# Patient Record
Sex: Male | Born: 1960 | Race: Black or African American | Hispanic: No | Marital: Single | Smoking: Former smoker
Health system: Southern US, Community
[De-identification: ages and names within clinical notes are randomized; demographics above are authoritative.]

## PROBLEM LIST (undated history)

## (undated) DIAGNOSIS — I469 Cardiac arrest, cause unspecified: Secondary | ICD-10-CM

## (undated) DIAGNOSIS — I5042 Chronic combined systolic (congestive) and diastolic (congestive) heart failure: Secondary | ICD-10-CM

## (undated) DIAGNOSIS — E785 Hyperlipidemia, unspecified: Secondary | ICD-10-CM

## (undated) DIAGNOSIS — G4730 Sleep apnea, unspecified: Secondary | ICD-10-CM

## (undated) DIAGNOSIS — N529 Male erectile dysfunction, unspecified: Secondary | ICD-10-CM

## (undated) DIAGNOSIS — Z950 Presence of cardiac pacemaker: Secondary | ICD-10-CM

## (undated) DIAGNOSIS — I10 Essential (primary) hypertension: Secondary | ICD-10-CM

## (undated) DIAGNOSIS — I255 Ischemic cardiomyopathy: Secondary | ICD-10-CM

## (undated) DIAGNOSIS — I2510 Atherosclerotic heart disease of native coronary artery without angina pectoris: Principal | ICD-10-CM

## (undated) HISTORY — PX: CARDIAC DEFIBRILLATOR PLACEMENT: SHX171

## (undated) HISTORY — DX: Ischemic cardiomyopathy: I25.5

## (undated) HISTORY — PX: WRIST TENODESIS: SHX1094

## (undated) HISTORY — DX: Chronic combined systolic (congestive) and diastolic (congestive) heart failure: I50.42

## (undated) HISTORY — PX: ACHILLES TENDON REPAIR: SUR1153

## (undated) HISTORY — DX: Atherosclerotic heart disease of native coronary artery without angina pectoris: I25.10

## (undated) HISTORY — DX: Male erectile dysfunction, unspecified: N52.9

## (undated) HISTORY — DX: Essential (primary) hypertension: I10

## (undated) HISTORY — DX: Hyperlipidemia, unspecified: E78.5

## (undated) NOTE — *Deleted (*Deleted)
Physical Medicine and Rehabilitation Admission H&P    Chief Complaint  Patient presents with  . Tachycardia  . Hypotension  : HPI: Gregory [name] is a 27 year old right-handed male with history of hyperlipidemia, seizure disorder maintained on Keppra, CAD, diastolic congestive heart failure maintained on Entresto, ischemic cardiomyopathy, VT status post AICD maintained on Plavix, quit smoking 7 years ago.  Per chart review lives with his son who works from home.  Multilevel home 10 steps to entry.  Reportedly independent prior to admission and active.  Presented 09/09/2020 with generalized weakness and dizziness in the setting of multiple days of diarrhea.  Denied any nausea vomiting or abdominal pain.  Ultrasound the abdomen showed cholelithiasis without evidence of acute cholecystitis and fatty liver.  Chest x-ray negative.  Admission chemistry sodium 130, potassium 3.1, chloride 95, glucose 189, BUN 55, creatinine 5.60, calcium 5.2, AST 88, lactic acid 3.2, troponin 48, magnesium 1.6, urine culture no growth.  Echocardiogram with ejection fraction of 55 to 60% no wall motion abnormalities.  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  Cardiac catheterization completed 09/24/2020 per Dr. Gery Pray for follow-up showed no obstructive disease.  Patient did experience some altered mental status cranial CT scan completed which was reviewed by neurology services no acute process noted.  He was placed empirically on IV Rocephin for question UTI latest cultures negative.  Follow-up cardiology services for acute on chronic diastolic congestive heart failure and patient has undergone diuresis.  Patient with persistent  elevations in glucose 186-237 and hemoglobin A1c 6.9 currently maintained on sliding scale insulin as well as scheduled Levemir 5 units twice daily.  Patient remains on Keppra for history of seizure disorder.  AKI likely prerenal in the setting of A. fib with RVR placed on gentle IV fluids and latest creatinine 0.86.  Tolerating a regular diet.  Therapy evaluations completed and patient was admitted for a comprehensive rehab program.  Review of Systems  Constitutional: Positive for malaise/fatigue. Negative for chills and fever.  HENT: Negative for hearing loss.   Eyes: Negative for blurred vision and double vision.  Respiratory: Positive for shortness of breath. Negative for cough.   Cardiovascular: Positive for palpitations and leg swelling. Negative for chest pain.  Gastrointestinal: Positive for constipation. Negative for heartburn, nausea and vomiting.  Genitourinary: Positive for urgency. Negative for dysuria, flank pain and hematuria.  Musculoskeletal: Positive for myalgias.  Skin: Negative for rash.  Neurological: Positive for seizures.  All other systems reviewed and are negative.  Past Medical History:  Diagnosis Date  . CAD (coronary artery disease) 12/01/2013  . Chronic combined systolic and diastolic CHF, NYHA class 1 (HCC) 12/01/2013  . Erectile dysfunction 12/01/2013  . HTN (hypertension) 12/01/2013  . Hyperlipidemia 12/01/2013  . Ischemic cardiomyopathy 12/01/2013  . Sleep apnea    Past Surgical History:  Procedure Laterality Date  . ACHILLES TENDON REPAIR     lft foot  . CARDIAC CATHETERIZATION N/A 05/05/2016   Procedure: Left Heart Cath and Coronary Angiography;  Surgeon: Marykay Lex, MD;  Location: [facility] INVASIVE CV LAB;  Service: Cardiovascular;  Laterality: N/A;  . CARDIAC DEFIBRILLATOR PLACEMENT    . CARDIOVERSION N/A 09/13/2020   Procedure: CARDIOVERSION;  Surgeon: Christell Constant, MD;  Location: [facility] ENDOSCOPY;  Service: Cardiovascular;  Laterality: N/A;   . EP IMPLANTABLE DEVICE N/A 12/19/2016   Procedure: ICD Generator Changeout;  Surgeon: Duke Salvia, MD;  Location: [facility] INVASIVE CV LAB;  Service: Cardiovascular;  Laterality: N/A;  . WRIST TENODESIS     Family History  Problem Relation Age of Onset  . Heart disease Mother   . Brain cancer Father   . Hypertension Daughter    Social History:  reports that he quit smoking about 7 years ago. His smoking use included cigarettes. He smoked 0.00 packs per day for 30.00 years. He has never used smokeless tobacco. He reports that he does not drink alcohol and does not use drugs. Allergies: No Known Allergies Medications Prior to Admission  Medication Sig Dispense Refill  . aspirin 81 MG tablet Take 81 mg by mouth daily.    Marland Kitchen atorvastatin (LIPITOR) 80 MG tablet TAKE ONE TABLET BY MOUTH EVERY EVENING (Patient taking differently: Take 80 mg by mouth daily. ) 90 tablet 3  . clopidogrel (PLAVIX) 75 MG tablet TAKE 1 TABLET(75 MG) BY MOUTH DAILY (Patient taking differently: Take 75 mg by mouth daily. ) 90 tablet 2  . COREG CR 80 MG 24 hr capsule Take 1 capsule (80 mg total) by mouth daily. 90 capsule 3  . ENTRESTO 97-103 MG TAKE 1 TABLET BY MOUTH TWICE DAILY (Patient taking differently: Take 1 tablet by mouth 2 (two) times daily. ) 60 tablet 10  . FEROSUL 325 (65 Fe) MG tablet TAKE 1 TABLET BY MOUTH DAILY WITH BREAKFAST (Patient taking differently: Take 325 mg by mouth daily with breakfast. ) 60 tablet 1  . hydrochlorothiazide (HYDRODIURIL) 25 MG tablet TAKE 1 TABLET(25 MG) BY MOUTH DAILY (Patient taking differently: Take 25 mg by mouth daily. ) 90 tablet 1  . levETIRAcetam (KEPPRA) 500 MG tablet TAKE 1 TABLET(500 MG) BY MOUTH TWICE DAILY (Patient taking differently: Take 500 mg by mouth 2 (two) times daily. ) 60 tablet 11  . metoprolol succinate (TOPROL XL) 25 MG 24 hr tablet Take 1 tablet (25 mg total) by mouth daily. 90 tablet 3  . pantoprazole (PROTONIX) 40 MG tablet TAKE 1 TABLET(40 MG) BY MOUTH DAILY  (Patient taking differently: Take 40 mg by mouth daily. ) 30 tablet 6  . polyethylene glycol (MIRALAX / GLYCOLAX) packet Take 17 g by mouth daily as needed for moderate constipation or severe constipation. 14 each 1  . potassium chloride (KLOR-CON) 10 MEQ tablet TAKE 1 TABLET BY MOUTH EVERY DAY (Patient taking differently: Take 10 mEq by mouth daily. ) 90 tablet 3  . sildenafil (VIAGRA) 50 MG tablet Take 1 tablet (50 mg total) by mouth as needed for erectile dysfunction. 30 tablet 4    Drug Regimen Review Drug regimen was reviewed and remains appropriate with no significant issues identified  Home: Home Living Family/patient expects to be discharged to:: Private residence Living Arrangements: Children Available Help at Discharge: Family, Available 24 hours/day Type of Home: House Home Access: Level entry Home Layout: Multi-level Alternate Level Stairs-Number of Steps: 10 Alternate Level Stairs-Rails: Right Bathroom Shower/Tub: Engineer, manufacturing systems: Standard Home Equipment: None   Functional History: Prior Function Level of Independence: Independent Comments: pt reports independent and driving, no assist with anything.  Functional Status:  Mobility: Bed Mobility Overal bed mobility: Needs Assistance Bed Mobility: Rolling, Supine to Sit Rolling: Min assist Supine to sit: Mod assist, HOB elevated Transfers Overall transfer level: Needs assistance Equipment used: Rolling walker (2 wheeled) Transfers: Sit to/from Stand, [employer]  to Stand: Mod assist, From elevated surface Stand pivot transfers: Mod assist, From elevated surface General transfer comment: declined stand and to recliner      ADL: ADL Overall ADL's : Needs assistance/impaired Eating/Feeding: Independent, Sitting Grooming: Set up, Wash/dry face, Oral care, Wash/dry hands, Sitting Upper Body Bathing: Set up, Sitting Lower Body Bathing: Moderate assistance, Bed level Lower Body  Bathing Details (indicate cue type and reason): long sitting.  Patient is not able to let go of the RW for LB ADL Upper Body Dressing : Set up, Sitting Lower Body Dressing: Moderate assistance, Sitting/lateral leans Functional mobility during ADLs: Moderate assistance General ADL Comments: Patient had just returned to bed and declined back to recliner.  Able to sit EOB.  Mod A  Cognition: Cognition Overall Cognitive Status: Within Functional Limits for tasks assessed Orientation Level: Oriented X4 Cognition Arousal/Alertness: Awake/alert Behavior During Therapy: WFL for tasks assessed/performed Overall Cognitive Status: Within Functional Limits for tasks assessed  Physical Exam: Blood pressure 113/74, pulse 85, temperature (!) 97.5 F (36.4 C), temperature source Oral, resp. rate 15, height 6\' 6"  (1.981 m), weight (!) 139.8 kg, SpO2 93 %. Physical Exam Neurological:     Comments: Patient is alert. Mood is a bit flat but appropriate. Makes eye contact with examiner. Follows simple commands. Provides his name and age.     Results for orders placed or performed during the hospital encounter of 09/09/20 (from the past 48 hour(s))  Glucose, capillary     Status: Abnormal   Collection Time: 09/19/20 11:38 AM  Result Value Ref Range   Glucose-Capillary 171 (H) 70 - 99 mg/dL    Comment: Glucose reference range applies only to samples taken after fasting for at least 8 hours.  Glucose, capillary     Status: Abnormal   Collection Time: 09/19/20  4:19 PM  Result Value Ref Range   Glucose-Capillary 236 (H) 70 - 99 mg/dL    Comment: Glucose reference range applies only to samples taken after fasting for at least 8 hours.  Glucose, capillary     Status: Abnormal   Collection Time: 09/19/20  8:15 PM  Result Value Ref Range   Glucose-Capillary 237 (H) 70 - 99 mg/dL    Comment: Glucose reference range applies only to samples taken after fasting for at least 8 hours.  Glucose, capillary      Status: Abnormal   Collection Time: 09/20/20  6:12 AM  Result Value Ref Range   Glucose-Capillary 186 (H) 70 - 99 mg/dL    Comment: Glucose reference range applies only to samples taken after fasting for at least 8 hours.  Glucose, capillary     Status: Abnormal   Collection Time: 09/20/20 11:12 AM  Result Value Ref Range   Glucose-Capillary 202 (H) 70 - 99 mg/dL    Comment: Glucose reference range applies only to samples taken after fasting for at least 8 hours.  Glucose, capillary     Status: Abnormal   Collection Time: 09/20/20  4:26 PM  Result Value Ref Range   Glucose-Capillary 228 (H) 70 - 99 mg/dL    Comment: Glucose reference range applies only to samples taken after fasting for at least 8 hours.  Glucose, capillary     Status: Abnormal   Collection Time: 09/20/20 10:15 PM  Result Value Ref Range   Glucose-Capillary 227 (H) 70 - 99 mg/dL    Comment: Glucose reference range applies only to samples taken after fasting for at least 8 hours.   Comment 1  Notify RN    Comment 2 Document in Chart   Basic metabolic panel     Status: Abnormal   Collection Time: 09/21/20  4:45 AM  Result Value Ref Range   Sodium 136 135 - 145 mmol/L   Potassium 4.4 3.5 - 5.1 mmol/L   Chloride 99 98 - 111 mmol/L   CO2 28 22 - 32 mmol/L   Glucose, Bld 195 (H) 70 - 99 mg/dL    Comment: Glucose reference range applies only to samples taken after fasting for at least 8 hours.   BUN 11 8 - 23 mg/dL   Creatinine, Ser 4.09 0.61 - 1.24 mg/dL   Calcium 7.8 (L) 8.9 - 10.3 mg/dL   GFR calc non Af Amer >60 >60 mL/min   Anion gap 9 5 - 15    Comment: Performed at [facility], [address], [place], [place] [address]  Glucose, capillary     Status: Abnormal   Collection Time: 09/21/20  6:12 AM  Result Value Ref Range   Glucose-Capillary 186 (H) 70 - 99 mg/dL    Comment: Glucose reference range applies only to samples taken after fasting for at least 8 hours.   Comment 1 Notify RN    Comment 2  Document in Chart    No results found.     Medical Problem List and Plan: 1.  Debility secondary to atrial fibrillation with RVR Acute encephalopathy/sepsis/multimedical.   -patient may *** shower  -ELOS/Goals: *** 2.  Antithrombotics: -DVT/anticoagulation: Eliquis  -antiplatelet therapy: Plavix 25 mg daily 3. Pain Management: Tramadol as needed 4. Mood: Provide emotional support  -antipsychotic agents: N/A 5. Neuropsych: This patient is capable of making decisions on his own behalf. 6. Skin/Wound Care: Routine skin checks 7. Fluids/Electrolytes/Nutrition: Routine in and outs with follow-up chemistries 8.  Diastolic congestive heart failure.  Continue Lasix 40 mg twice daily, Coreg 80 mg daily, Entresto 97-103 mg twice daily.  Follow-up cardiology service.  Monitor for any signs of fluid overload 9.  A. fib with RVR.  Status post DCCV 09/19/2020.  Continue amiodarone as well as Toprol 25 mg daily, 10.  History of seizure disorder.  Keppra 500 mg twice daily 11.  New onset diabetes mellitus.  Hemoglobin A1c pending.  Levemir 5 units twice daily.  Diabetic teaching 12.  Hyperlipidemia.  Lipitor 13.  Constipation.  MiraLAX twice daily, Colace twice daily   ***  Charlton Amor, PA-C 09/21/2020

## (undated) NOTE — *Deleted (*Deleted)
Physician Discharge Summary  Patient ID: Gregory [name] MRN: [id number] DOB/AGE: [birth year] 67 y.o.  Admit date: 09/29/2020 Discharge date: 09/29/2020  Admission Diagnoses:  Discharge Diagnoses:  Principal Problem:   Debility   Discharged Condition: stable  Hospital Course: Patient is a 48 year old male with past medical history significant for  coronary artery disease, chronic diastolic heart failure, ischemic cardiomyopathy, essential hypertension and status post cardiac defibrillation.  Patient initially presented to the ED with nausea vomiting and diarrhea, and was found to be in acute kidney injury, hypotensive requiring vasopressors, and was initially admitted to the ICU.  Work-up for possible infection came back negative.  During the hospital stay, patient developed atrial fibrillation with RVR.  Cardiology team was consulted and they recommended IV amiodarone and IV heparin.  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  Patient underwent cardioversion on 09/13/2020 which was unsuccessful.  EP was consulted and patient was started on amiodarone, and proceeded with DCCV on 09/19/2020.  Patient was converted to normal sinus rhythm.  During the hospital stay, patient developed acute confusion and leukocytosis suspected to be secondary to UTI/sepsis.  Patient was treated empirically on antibiotics.  Encephalopathy has resolved.  Patient was seen by the physical therapy team and CIR was recommended.  Patient will be discharged to CIR.  Ventricular fibrillation status post AICD and CAD/A. fib with RVR -CHADS Vasc score greater than 4. -Patient will be discharged on amiodarone.   -Patient underwent cardiac catheterization that did not reveal any new obstructive disease. -Patient be discharged on Eliquis.  Persistent atrial fibrillation with RVR: -See above.  Acute confusional state/acute encephalopathy: -Suspected to be of infectious etiology.  -CT  scan unremarkable unable to do MRI due to AICD. -Patient was treated with antibiotics empirically.   -Encephalopathy has resolved.    Severe sepsis due to UTI: -Treated empirically with IV Rocephin.   Acute on chronic diastolic heart failure: -Stable.   -Continue diuretics.   -Continue Entresto and Coreg.  Acute kidney injury: -Likely multifactorial. -Resolved.    Thrombocytopenia: -Days developed after starting heparin.   -Heparin was discontinued. -Thrombocytopenia has resolved.  Coronary artery disease: Stable.   Cardiac catheterization was not revealing.    Macrocytic anemia: Folate of 300, B12 was 683. Iron of 27 ferritin of 1000.  Obstructive sleep apnea: Continue CPAP at night.  Mild transaminitis: Abdominal ultrasound was negative for acute cholecystitis  Patient denied any abdominal pain. Resolved.  Newly diagnosed diabetes mellitus type 2: Hemoglobin A1c was 6.9%.   Continue to optimize blood sugar.    Seizures: Continue Keppra.  Consults: cardiology  Electrophysiology  Significant Diagnostic Studies:  -24 hours urine protein was 2.016 g  -CT head without contrast was done on 1000 2021 revealed: "Subtle hypodensity and loss of gray-white differentiation involving the right insula and portions of the right frontal lobe. While this could relate to streak artifact in this region, acute MCA territory infarct is not excluded. MRI could further evaluate if there is clinical concern".  -Cardiac catheterization done on 09/24/2020 revealed:  Ost RCA to Mid RCA lesion is 100% stenosed.  Previously placed 1st Mrg stent (unknown type) is widely patent.  Prox Cx lesion is 100% stenosed.  Previously placed 1st Diag stent (unknown type) is widely patent.  Previously placed Prox LAD to Mid LAD stent (unknown type) is widely patent.  -Echocardiogram done on 09/22/2020 revealed: 1. Left ventricular ejection fraction, by estimation, is 40 to  45%. The  left ventricle has mildly decreased function. The left  ventricle  demonstrates regional wall motion abnormalities (see scoring  diagram/findings for description). Left ventricular  diastolic parameters are consistent with Grade I diastolic dysfunction  (impaired relaxation).  2. Right ventricular systolic function was not well visualized. The right  ventricular size is normal.  3. The mitral valve is grossly normal. No evidence of mitral valve  regurgitation.  4. The aortic valve was not well visualized.  5. The inferior vena cava is normal in size with greater than 50%  respiratory variability, suggesting right atrial pressure of 3 mmHg.   Comparison(s): Slight decrease in LV function with different caliber  (non-contrasted) study. Slight decrease in global LVEF.    Discharge Exam: There were no vitals taken for this visit.   Disposition: Discharge disposition: 02-Transferred to [facility]   Allergies as of 09/29/2020   No Known Allergies   Med Rec must be completed prior to using this SMARTLINK***        Signed: Barnetta Chapel 09/29/2020, 2:52 PM

---

## 2003-12-16 DIAGNOSIS — Z95810 Presence of automatic (implantable) cardiac defibrillator: Secondary | ICD-10-CM

## 2003-12-16 HISTORY — DX: Presence of automatic (implantable) cardiac defibrillator: Z95.810

## 2009-10-29 ENCOUNTER — Ambulatory Visit: Payer: Self-pay | Admitting: Internal Medicine

## 2009-10-29 ENCOUNTER — Inpatient Hospital Stay (HOSPITAL_COMMUNITY): Admission: AD | Admit: 2009-10-29 | Discharge: 2009-11-01 | Payer: Self-pay | Admitting: Cardiovascular Disease

## 2009-10-30 ENCOUNTER — Encounter: Payer: Self-pay | Admitting: Internal Medicine

## 2009-10-31 ENCOUNTER — Encounter: Payer: Self-pay | Admitting: Internal Medicine

## 2009-11-01 ENCOUNTER — Encounter: Payer: Self-pay | Admitting: Internal Medicine

## 2009-11-02 ENCOUNTER — Encounter: Payer: Self-pay | Admitting: Internal Medicine

## 2009-11-15 ENCOUNTER — Encounter: Payer: Self-pay | Admitting: Internal Medicine

## 2009-11-16 ENCOUNTER — Ambulatory Visit: Payer: Self-pay

## 2011-01-14 NOTE — Cardiovascular Report (Signed)
Summary: Office Visit   Office Visit   Imported By: Roderic Ovens 12/27/2009 12:00:59  _____________________________________________________________________  External Attachment:    Type:   Image     Comment:   External Document

## 2011-03-19 LAB — COMPREHENSIVE METABOLIC PANEL
AST: 19 U/L (ref 0–37)
BUN: 11 mg/dL (ref 6–23)
CO2: 28 mEq/L (ref 19–32)
Chloride: 101 mEq/L (ref 96–112)
Creatinine, Ser: 1.1 mg/dL (ref 0.4–1.5)
GFR calc non Af Amer: >60 mL/min (ref >60)
Glucose, Bld: 106 mg/dL — ABNORMAL HIGH (ref 70–99)
Total Bilirubin: 0.5 mg/dL (ref 0.3–1.2)

## 2011-03-19 LAB — BASIC METABOLIC PANEL
BUN: 11 mg/dL (ref 6–23)
Calcium: 8.5 mg/dL (ref 8.4–10.5)
Calcium: 8.5 mg/dL (ref 8.4–10.5)
Chloride: 104 mEq/L (ref 96–112)
Chloride: 106 mEq/L (ref 96–112)
Creatinine, Ser: 0.99 mg/dL (ref 0.4–1.5)
Creatinine, Ser: 1 mg/dL (ref 0.4–1.5)
GFR calc Af Amer: >60 mL/min (ref >60)
Sodium: 139 mEq/L (ref 135–145)

## 2011-03-19 LAB — TSH: TSH: 1.057 u[IU]/mL (ref 0.350–4.500)

## 2011-03-19 LAB — CBC
HCT: 44.7 % (ref 39.0–52.0)
MCHC: 34.7 g/dL (ref 30.0–36.0)
MCV: 97.2 fL (ref 78.0–100.0)
MCV: 97.4 fL (ref 78.0–100.0)
Platelets: 190 10*3/uL (ref 150–400)
RBC: 4.53 MIL/uL (ref 4.22–5.81)
RBC: 4.59 MIL/uL (ref 4.22–5.81)
WBC: 11.5 10*3/uL — ABNORMAL HIGH (ref 4.0–10.5)
WBC: 12.4 10*3/uL — ABNORMAL HIGH (ref 4.0–10.5)
WBC: 11.9 10*3/uL — ABNORMAL HIGH (ref 4.0–10.5)

## 2011-03-19 LAB — HEPATIC FUNCTION PANEL
AST: 49 U/L — ABNORMAL HIGH (ref 0–37)
Bilirubin, Direct: 0.1 mg/dL (ref 0.0–0.3)
Indirect Bilirubin: 0.5 mg/dL (ref 0.3–0.9)
Total Bilirubin: 0.6 mg/dL (ref 0.3–1.2)

## 2011-03-19 LAB — HEMOGLOBIN A1C: Mean Plasma Glucose: 117 mg/dL

## 2011-03-19 LAB — PROTIME-INR: INR: 1.12 (ref 0.00–1.49)

## 2011-08-19 ENCOUNTER — Encounter: Payer: Self-pay | Admitting: *Deleted

## 2011-10-06 ENCOUNTER — Other Ambulatory Visit: Payer: Self-pay | Admitting: Internal Medicine

## 2011-10-06 ENCOUNTER — Other Ambulatory Visit: Payer: Self-pay | Admitting: Cardiovascular Disease

## 2011-10-06 ENCOUNTER — Ambulatory Visit
Admission: RE | Admit: 2011-10-06 | Discharge: 2011-10-06 | Disposition: A | Discharge status: Home / Self Care | Payer: Self-pay | Source: Ambulatory Visit | Attending: Cardiovascular Disease | Admitting: Cardiovascular Disease

## 2011-10-06 DIAGNOSIS — Z950 Presence of cardiac pacemaker: Secondary | ICD-10-CM

## 2012-12-31 ENCOUNTER — Other Ambulatory Visit (HOSPITAL_COMMUNITY): Payer: Self-pay | Admitting: Cardiovascular Disease

## 2012-12-31 DIAGNOSIS — Z95810 Presence of automatic (implantable) cardiac defibrillator: Secondary | ICD-10-CM

## 2012-12-31 DIAGNOSIS — R011 Cardiac murmur, unspecified: Secondary | ICD-10-CM

## 2012-12-31 DIAGNOSIS — I509 Heart failure, unspecified: Secondary | ICD-10-CM

## 2013-01-12 ENCOUNTER — Ambulatory Visit (HOSPITAL_COMMUNITY): Payer: Self-pay

## 2013-01-26 ENCOUNTER — Ambulatory Visit (HOSPITAL_COMMUNITY): Payer: Self-pay

## 2013-02-11 ENCOUNTER — Inpatient Hospital Stay (HOSPITAL_COMMUNITY): Admission: RE | Admit: 2013-02-11 | Payer: Self-pay | Source: Ambulatory Visit

## 2013-02-16 ENCOUNTER — Ambulatory Visit (HOSPITAL_COMMUNITY)
Admission: RE | Admit: 2013-02-16 | Discharge: 2013-02-16 | Disposition: A | Discharge status: Home / Self Care | Payer: Worker's Compensation | Source: Ambulatory Visit | Attending: Cardiovascular Disease | Admitting: Cardiovascular Disease

## 2013-02-16 DIAGNOSIS — I509 Heart failure, unspecified: Secondary | ICD-10-CM

## 2013-02-16 DIAGNOSIS — R011 Cardiac murmur, unspecified: Secondary | ICD-10-CM

## 2013-02-16 DIAGNOSIS — Z950 Presence of cardiac pacemaker: Secondary | ICD-10-CM | POA: Insufficient documentation

## 2013-02-16 DIAGNOSIS — Z95810 Presence of automatic (implantable) cardiac defibrillator: Secondary | ICD-10-CM

## 2013-02-16 NOTE — Progress Notes (Signed)
[facility]   2D echo completed 02/16/2013.   Cindy Rigg, RDCS  

## 2013-05-05 ENCOUNTER — Other Ambulatory Visit: Payer: Self-pay

## 2013-05-05 MED ORDER — AMLODIPINE BESYLATE 10 MG PO TABS: 10.0000 mg | ORAL_TABLET | Freq: Every day | ORAL | Status: DC

## 2013-05-05 NOTE — Telephone Encounter (Signed)
Rx was sent to pharmacy electronically. 

## 2013-07-29 ENCOUNTER — Other Ambulatory Visit: Payer: Self-pay | Admitting: *Deleted

## 2013-07-29 MED ORDER — DIGOXIN 125 MCG PO TABS: 0.1250 mg | ORAL_TABLET | Freq: Every day | ORAL | Status: DC

## 2013-09-23 ENCOUNTER — Encounter: Payer: Self-pay | Admitting: Cardiovascular Disease

## 2013-10-03 ENCOUNTER — Other Ambulatory Visit: Payer: Self-pay | Admitting: *Deleted

## 2013-10-03 MED ORDER — HYDROCHLOROTHIAZIDE 25 MG PO TABS: 25.0000 mg | ORAL_TABLET | Freq: Every day | ORAL | Status: DC

## 2013-11-04 ENCOUNTER — Telehealth: Payer: Self-pay | Admitting: *Deleted

## 2013-11-04 MED ORDER — ATORVASTATIN CALCIUM 80 MG PO TABS: 80.0000 mg | ORAL_TABLET | Freq: Every day | ORAL | Status: DC

## 2013-11-04 NOTE — Telephone Encounter (Signed)
Pt was calling to schedule an appointment and mentioned that his pharmacy sent over a refill request for Lipitor and they have not heard back from our office. He took his last pill today

## 2013-11-04 NOTE — Telephone Encounter (Signed)
Returned call and pt verified x 2.  Pt informed request not received.  Confirmed dose.  Refill(s) sent to pharmacy for 90-day supply and message to fax hardy requests to [phone] for [place] refills.

## 2013-11-08 ENCOUNTER — Other Ambulatory Visit: Payer: Self-pay | Admitting: Cardiology

## 2013-11-08 NOTE — Telephone Encounter (Signed)
Rx was sent to pharmacy electronically. 

## 2013-11-17 ENCOUNTER — Telehealth: Payer: Self-pay | Admitting: Cardiovascular Disease

## 2013-11-17 MED ORDER — RAMIPRIL 10 MG PO CAPS: 20.0000 mg | ORAL_CAPSULE | Freq: Every day | ORAL | Status: DC

## 2013-11-17 NOTE — Telephone Encounter (Signed)
Calling to see if we received the Fax on a refill for Ramipril 10mg  .. Was sent over Dec1 and rfaxed over on today .Marland Kitchen Please call   Thanks

## 2013-11-17 NOTE — Telephone Encounter (Signed)
Message forwarded to Medical Records to locate refill request.  Will need paper chart as pt is a former [place] pt.

## 2013-11-17 NOTE — Telephone Encounter (Signed)
Request received.  Refill(s) sent to pharmacy.

## 2013-11-30 ENCOUNTER — Ambulatory Visit: Payer: Self-pay | Admitting: Cardiovascular Disease

## 2013-11-30 ENCOUNTER — Ambulatory Visit (INDEPENDENT_AMBULATORY_CARE_PROVIDER_SITE_OTHER): Payer: Worker's Compensation | Admitting: Cardiovascular Disease

## 2013-11-30 ENCOUNTER — Encounter: Payer: Self-pay | Admitting: Cardiovascular Disease

## 2013-11-30 VITALS — BP 138/80 | HR 66 | Ht 78.0 in | Wt 307.8 lb

## 2013-11-30 DIAGNOSIS — I5042 Chronic combined systolic (congestive) and diastolic (congestive) heart failure: Secondary | ICD-10-CM

## 2013-11-30 DIAGNOSIS — I509 Heart failure, unspecified: Secondary | ICD-10-CM

## 2013-11-30 DIAGNOSIS — N529 Male erectile dysfunction, unspecified: Secondary | ICD-10-CM

## 2013-11-30 DIAGNOSIS — I10 Essential (primary) hypertension: Secondary | ICD-10-CM

## 2013-11-30 DIAGNOSIS — I255 Ischemic cardiomyopathy: Secondary | ICD-10-CM

## 2013-11-30 DIAGNOSIS — I428 Other cardiomyopathies: Secondary | ICD-10-CM

## 2013-11-30 DIAGNOSIS — G4733 Obstructive sleep apnea (adult) (pediatric): Secondary | ICD-10-CM

## 2013-11-30 DIAGNOSIS — E785 Hyperlipidemia, unspecified: Secondary | ICD-10-CM

## 2013-11-30 DIAGNOSIS — Z95810 Presence of automatic (implantable) cardiac defibrillator: Secondary | ICD-10-CM

## 2013-11-30 DIAGNOSIS — I2589 Other forms of chronic ischemic heart disease: Secondary | ICD-10-CM

## 2013-11-30 DIAGNOSIS — I2510 Atherosclerotic heart disease of native coronary artery without angina pectoris: Secondary | ICD-10-CM

## 2013-11-30 LAB — ICD DEVICE OBSERVATION

## 2013-11-30 NOTE — Patient Instructions (Addendum)
Remote monitoring is used to monitor your ICD from home. This monitoring reduces the number of office visits required to check your device to one time per year. It allows [place] to keep an eye on the functioning of your device to ensure it is working properly. You are scheduled for a device check from home on 03-03-2014. You may send your transmission at any time that day. If you have a wireless device, the transmission will be sent automatically. After your physician reviews your transmission, you will receive a postcard with your next transmission date.  Your physician recommends that you schedule a follow-up appointment in: 6 months

## 2013-12-01 ENCOUNTER — Encounter: Payer: Self-pay | Admitting: Cardiovascular Disease

## 2013-12-01 DIAGNOSIS — G4733 Obstructive sleep apnea (adult) (pediatric): Secondary | ICD-10-CM | POA: Insufficient documentation

## 2013-12-01 DIAGNOSIS — I2510 Atherosclerotic heart disease of native coronary artery without angina pectoris: Secondary | ICD-10-CM

## 2013-12-01 DIAGNOSIS — I255 Ischemic cardiomyopathy: Secondary | ICD-10-CM

## 2013-12-01 DIAGNOSIS — E785 Hyperlipidemia, unspecified: Secondary | ICD-10-CM

## 2013-12-01 DIAGNOSIS — I10 Essential (primary) hypertension: Secondary | ICD-10-CM | POA: Insufficient documentation

## 2013-12-01 DIAGNOSIS — E7800 Pure hypercholesterolemia, unspecified: Secondary | ICD-10-CM | POA: Insufficient documentation

## 2013-12-01 DIAGNOSIS — I5042 Chronic combined systolic (congestive) and diastolic (congestive) heart failure: Secondary | ICD-10-CM | POA: Insufficient documentation

## 2013-12-01 DIAGNOSIS — N529 Male erectile dysfunction, unspecified: Secondary | ICD-10-CM

## 2013-12-01 DIAGNOSIS — Z95810 Presence of automatic (implantable) cardiac defibrillator: Secondary | ICD-10-CM | POA: Insufficient documentation

## 2013-12-01 HISTORY — DX: Essential (primary) hypertension: I10

## 2013-12-01 HISTORY — DX: Atherosclerotic heart disease of native coronary artery without angina pectoris: I25.10

## 2013-12-01 HISTORY — DX: Chronic combined systolic (congestive) and diastolic (congestive) heart failure: I50.42

## 2013-12-01 HISTORY — DX: Hyperlipidemia, unspecified: E78.5

## 2013-12-01 HISTORY — DX: Ischemic cardiomyopathy: I25.5

## 2013-12-01 HISTORY — DX: Male erectile dysfunction, unspecified: N52.9

## 2013-12-01 NOTE — Assessment & Plan Note (Addendum)
He never had angina when he presented with heart failure and an occluded right coronary artery. He currently has excellent functional status. Any deterioration in exercise capacity or development of exertional dyspnea should lead to prompt evaluation for new coronary lesions. He is currently on dual antiplatelet therapy and we discussed the fact that clopidogrel as only a very small additional absolute benefit to treatment with aspirin. He has never had bleeding problems and for the time being we'll continue this regimen.

## 2013-12-01 NOTE — Assessment & Plan Note (Addendum)
Current functional status, good commitment to physical exercise, the only problem is that he is obese. He requires very little in the way of diuretics. Reminded him about the importance of sodium restriction and encouraged him to try to lose some real weight. Avoid intense isometric exercise, prefer isotonic aerobic exercise.  I do not understand why he was prescribed digoxin since he does not have overt heart failure. We discussed the fact that this is a potentially toxic medication with a low therapeutic index. After negative chance to review all his old records I may recommend discontinuing it.

## 2013-12-01 NOTE — Assessment & Plan Note (Signed)
Mild to moderate ischemic cardiomyopathy with an ejection fraction that appears to be around 40-45% and with echo evidence of diastolic dysfunction but low filling pressures. He is on appropriate therapy with high doses of ACE inhibitors and beta blockers. No changes are planned his medications today

## 2013-12-01 NOTE — Progress Notes (Signed)
Patient ID: Alphonsa Gin, male   DOB: June 28, 1961, 52 y.o.   MRN: [id number]      Reason for office visit CAD, VT, ICD, CHF  Mr. 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 He is originally from [place] but worked in [place] until his retirement. He is here to establish new cardiology followup upon Dr. Rocco Serene retirement.   Mr. Elvin has had painless ischemic cardiomyopathy. He presented with congestive heart failure in 2005 in [place]. He was treated with diuretics and cardiac catheterization showed an occluded right coronary artery. He received a defibrillator. I am not sure whether this was implanted for primary prevention or whether he had documented sustained VT. At this point in time his left ventricular ejection fraction is only mildly to moderately depressed at 40-45%. As far as I can tell he has not had congestive heart failure since his initial presentation, once was placed on appropriate medications. He does not have any angina either at rest or with exertion and has excellent functional status (NYHA class I currently). He exercises at the gym most days of the week the  He has chronic total occlusion of the right coronary artery and in the remote past received a drug-eluting stents to the circumflex and diagonal arteries in 2005 (taxus, size unknown). He has not required repeat angiography. His last functional study was in October of 2011 and showed an extensive infero-or an inferolateral scar with a left ventricular ejection fraction of 37%. No reversible ischemia was seen. His last echocardiogram was performed in March of 2014 showed an ejection fraction of 45-50% with inferior hypokinesis. Has moderate LVH. The left atrium was described as being moderately dilated. There were no significant valvular abnormalities.  He is on appropriate treatment with ACE inhibitors and carvedilol and the only diuretic he takes his hydrochlorothiazide. In  addition he needs hydralazine amlodipine for blood pressure control. He is on chronic antiplatelet therapy with aspirin and clopidogrel and takes high-dose atorvastatin for hypercholesterolemia.   He is moderately obese but is also very broad shouldered and muscular gentleman. BMI is >35.  He has no complaints. The only symptom he describes his mild erectile dysfunction which is well compensated with sildenafil.  He uses CPAP for obstructive sleep apnea. It sounds like he hates the mask because it dries out his mouth and his eyes. Nevertheless he is trying to be compliant.  He has a dual-chamber Medtronic Virtuoso defibrillator that has had a variety of lead problems. He had a Sprint Fidelis lead that was revised in 2010 for elevated impedance. He has not had any defibrillator shocks. His device is a dual-chamber platform but is programmed as a single-chamber defibrillator because his atrial lead repeatedly alarmed for low impedance. When last evaluated it had normal pacing thresholds and sensing and only had problems with low impedance. He was never using the pacing features of the device so the atrial channel was programmed off.  No Known Allergies  Current Outpatient Prescriptions  Medication Sig Dispense Refill  . amLODipine (NORVASC) 10 MG tablet Take 1 tablet (10 mg total) by mouth daily.  30 tablet  7  . aspirin 81 MG tablet Take 81 mg by mouth daily.      Marland Kitchen atorvastatin (LIPITOR) 80 MG tablet Take 1 tablet (80 mg total) by mouth daily.  90 tablet  3  . clopidogrel (PLAVIX) 75 MG tablet Take 75 mg by mouth daily with breakfast.      . COREG CR 80  MG 24 hr capsule TAKE ONE CAPSULE BY MOUTH EVERY DAY  30 capsule  2  . digoxin (LANOXIN) 0.125 MG tablet Take 1 tablet (0.125 mg total) by mouth daily.  30 tablet  6  . hydrALAZINE (APRESOLINE) 100 MG tablet Take 100 mg by mouth 2 (two) times daily.      . hydrochlorothiazide (HYDRODIURIL) 25 MG tablet Take 1 tablet (25 mg total) by mouth daily.   90 tablet  1  . potassium chloride (K-DUR,KLOR-CON) 10 MEQ tablet Take 10 mEq by mouth daily.      . ramipril (ALTACE) 10 MG capsule Take 2 capsules (20 mg total) by mouth daily. Keep appointment for refills  60 capsule  0  . sildenafil (VIAGRA) 50 MG tablet Take 50 mg by mouth daily as needed.       No current facility-administered medications for this visit.    Past Medical History  Diagnosis Date  . CAD (coronary artery disease) 12/01/2013  . Ischemic cardiomyopathy 12/01/2013  . Chronic combined systolic and diastolic CHF, NYHA class 1 12/01/2013  . Erectile dysfunction 12/01/2013  . HTN (hypertension) 12/01/2013  . Hyperlipidemia 12/01/2013    No past surgical history.  No family history of inheritable disorders that he knows of  History   Social History  . Marital Status: Single    Spouse Name: N/A    Number of Children: N/A  . Years of Education: N/A   Occupational History  . Not on file.   Social History Main Topics  . Smoking status: Former Smoker -- 0.50 packs/day for 30 years    Types: Cigarettes    Quit date: 05/31/2013  . Smokeless tobacco: Not on file  . Alcohol Use: No  . Drug Use: Not on file  . Sexual Activity: Not on file   Other Topics Concern  . Not on file   Social History Narrative  . No narrative on file    Review of systems: Mild erectile dysfunction. The patient specifically denies any chest pain at rest or with exertion, dyspnea at rest or with exertion, orthopnea, paroxysmal nocturnal dyspnea, syncope, palpitations, focal neurological deficits, intermittent claudication, lower extremity edema, unexplained weight gain, cough, hemoptysis or wheezing.  The patient also denies abdominal pain, nausea, vomiting, dysphagia, diarrhea, constipation, polyuria, polydipsia, dysuria, hematuria, frequency, urgency, abnormal bleeding or bruising, fever, chills, unexpected weight changes, mood swings, change in skin or hair texture, change in voice  quality, auditory or visual problems, allergic reactions or rashes, new musculoskeletal complaints other than usual "aches and pains".   PHYSICAL EXAM BP 138/80  Pulse 66  Ht 6\' 6"  (1.981 m)  Wt 307 lb 12.8 oz (139.617 kg)  BMI 35.58 kg/m2  General: Alert, oriented x3, no distress Head: no evidence of trauma, PERRL, EOMI, no exophtalmos or lid lag, no myxedema, no xanthelasma; normal ears, nose and oropharynx Neck: normal jugular venous pulsations and no hepatojugular reflux; brisk carotid pulses without delay and no carotid bruits Chest: clear to auscultation, no signs of consolidation by percussion or palpation, normal fremitus, symmetrical and full respiratory excursions Cardiovascular: normal position and quality of the apical impulse, regular rhythm, normal first and second heart sounds, no murmurs, rubs or gallops Abdomen: no tenderness or distention, no masses by palpation, no abnormal pulsatility or arterial bruits, normal bowel sounds, no hepatosplenomegaly Extremities: no clubbing, cyanosis or edema; 2+ radial, ulnar and brachial pulses bilaterally; 2+ right femoral, posterior tibial and dorsalis pedis pulses; 2+ left femoral, posterior tibial and dorsalis pedis pulses;  no subclavian or femoral bruits Neurological: grossly nonfocal   EKG: Sinus rhythm, frequent multiform PVCs, old inferior infarction, T-wave abnormalities primarily in leads 1 aVL and in the inferior leads, QTC 465 ms  Lipid Panel  The most recent labs that I can find are from November 2012 with a total cholesterol of 102, triglycerides 47, HDL 26 LDL 67  BMET    Component Value Date/Time   NA 139 11/01/2009 0520   K 3.4* 11/01/2009 0520   CL 106 11/01/2009 0520   CO2 26 11/01/2009 0520   GLUCOSE 113* 11/01/2009 0520   BUN 15 11/01/2009 0520   CREATININE 0.99 11/01/2009 0520   CALCIUM 8.5 11/01/2009 0520   GFRNONAA >60 11/01/2009 0520   GFRAA  Value: >60        The eGFR has been calculated using the MDRD  equation. This calculation has not been validated in all clinical situations. eGFR's persistently <60 mL/min signify possible Chronic Kidney Disease. 11/01/2009 0520     ASSESSMENT AND PLAN Ischemic cardiomyopathy Mild to moderate ischemic cardiomyopathy with an ejection fraction that appears to be around 40-45% and with echo evidence of diastolic dysfunction but low filling pressures. He is on appropriate therapy with high doses of ACE inhibitors and beta blockers. No changes are planned his medications today  Chronic combined systolic and diastolic CHF, NYHA class 1 Current functional status, good commitment to physical exercise, the only problem is that he is obese. He requires very little in the way of diuretics. Reminded him about the importance of sodium restriction and encouraged him to try to lose some real weight. Avoid intense isometric exercise, prefer isotonic aerobic exercise.   CAD (coronary artery disease) He never had angina when he presented with heart failure and an occluded right coronary artery. He currently has excellent functional status. Any deterioration in exercise capacity or development of exertional dyspnea should lead to prompt evaluation for new coronary lesions. He is currently on dual antiplatelet therapy and we discussed the fact that clopidogrel as only a very small additional absolute benefit to treatment with aspirin. He has never had bleeding problems and for the time being we'll continue this regimen.  Erectile dysfunction Educated about the critical importance of avoiding coadministration of nitrates with sildenafil or any other phosphodiesterase inhibitor  HTN (hypertension) Good control on multiple agents. He has evidence of significant left ventricular hypertrophy  Hyperlipidemia Excellent lipid profile but not checked in quite a while. It is probably time to review it.  ICD - dual chamber Medtronic programmed VVI Other than the problem with the  atrial lead described above, his ICD is functioning normally. At this point is not requiring atrial pacing. When it comes time to replace his generator we will have to decide whether it is worth placing a new atrial lead. He is a good candidate for remote device monitoring. Enrolled in [employer]. Shock plan discussed. Importance of compliance with beta blocker to avoid rebound tachycardia discussed.    Orders Placed This Encounter  Procedures  . EKG 12-Lead   Meds ordered this encounter  Medications  . aspirin 81 MG tablet    Sig: Take 81 mg by mouth daily.  . hydrALAZINE (APRESOLINE) 100 MG tablet    Sig: Take 100 mg by mouth 2 (two) times daily.  . potassium chloride (K-DUR,KLOR-CON) 10 MEQ tablet    Sig: Take 10 mEq by mouth daily.  . clopidogrel (PLAVIX) 75 MG tablet    Sig: Take 75 mg by mouth daily  with breakfast.  . sildenafil (VIAGRA) 50 MG tablet    Sig: Take 50 mg by mouth daily as needed.    Junious Silk, MD, [facility] [phone] office [phone] pager

## 2013-12-01 NOTE — Assessment & Plan Note (Addendum)
Other than the problem with the atrial lead described above, his ICD is functioning normally. At this point is not requiring atrial pacing. When it comes time to replace his generator we will have to decide whether it is worth placing a new atrial lead. He is a good candidate for remote device monitoring. Enrolled in [employer]. Shock plan discussed. Importance of compliance with beta blocker to avoid rebound tachycardia discussed.

## 2013-12-01 NOTE — Assessment & Plan Note (Signed)
Educated about the critical importance of avoiding coadministration of nitrates with sildenafil or any other phosphodiesterase inhibitor

## 2013-12-01 NOTE — Assessment & Plan Note (Signed)
Good control on multiple agents. He has evidence of significant left ventricular hypertrophy

## 2013-12-01 NOTE — Assessment & Plan Note (Signed)
Excellent lipid profile but not checked in quite a while. It is probably time to review it.

## 2013-12-02 ENCOUNTER — Encounter: Payer: Self-pay | Admitting: Cardiovascular Disease

## 2013-12-03 LAB — MDC_IDC_ENUM_SESS_TYPE_INCLINIC
Battery Voltage: 3.05 V
Brady Statistic RV Percent Paced: 0 %
HighPow Impedance: 62 Ohm
Lead Channel Impedance Value: 589 Ohm
Lead Channel Sensing Intrinsic Amplitude: 20 mV
Lead Channel Setting Sensing Sensitivity: 0.6 mV

## 2013-12-05 ENCOUNTER — Other Ambulatory Visit: Payer: Self-pay | Admitting: Cardiovascular Disease

## 2013-12-05 NOTE — Telephone Encounter (Signed)
Rx was sent to pharmacy electronically. 

## 2013-12-07 ENCOUNTER — Telehealth: Payer: Self-pay | Admitting: *Deleted

## 2013-12-07 NOTE — Telephone Encounter (Signed)
Message copied by Vita Barley on Wed Dec 07, 2013  1:08 PM ------      Message from: Thurmon Fair      Created: Thu Dec 01, 2013  4:24 PM       Britta Mccreedy tell him I was looking through his records and I really don't think he needs digoxin. He can stop this medication.      Also, if our records are correct he has not had a lipid profile since November of 2012. Please order a lipid profile, cmet and hemoglobin A1c unless this was done elsewhere. ------

## 2013-12-07 NOTE — Telephone Encounter (Signed)
LM to stop Digoxin.  Will call Monday to see if he has had any labs done through his PCP, if not will order.

## 2013-12-12 ENCOUNTER — Telehealth: Payer: Self-pay | Admitting: *Deleted

## 2013-12-12 DIAGNOSIS — E782 Mixed hyperlipidemia: Secondary | ICD-10-CM

## 2013-12-12 DIAGNOSIS — R7309 Other abnormal glucose: Secondary | ICD-10-CM

## 2013-12-12 DIAGNOSIS — Z79899 Other long term (current) drug therapy: Secondary | ICD-10-CM

## 2013-12-12 NOTE — Telephone Encounter (Signed)
Unable to reach patient at phone # x 2 attempts.  Lab order and note mailed to patient.

## 2013-12-12 NOTE — Telephone Encounter (Signed)
Message copied by Vita Barley on Mon Dec 12, 2013  1:17 PM ------      Message from: Thurmon Fair      Created: Thu Dec 01, 2013  4:24 PM       Britta Mccreedy tell him I was looking through his records and I really don't think he needs digoxin. He can stop this medication.      Also, if our records are correct he has not had a lipid profile since November of 2012. Please order a lipid profile, cmet and hemoglobin A1c unless this was done elsewhere. ------

## 2014-01-11 ENCOUNTER — Other Ambulatory Visit: Payer: Self-pay | Admitting: *Deleted

## 2014-01-11 MED ORDER — SILDENAFIL CITRATE 50 MG PO TABS: 50.0000 mg | ORAL_TABLET | Freq: Every day | ORAL | Status: DC | PRN

## 2014-01-11 MED ORDER — POTASSIUM CHLORIDE CRYS ER 10 MEQ PO TBCR: 10.0000 meq | EXTENDED_RELEASE_TABLET | Freq: Every day | ORAL | Status: DC

## 2014-01-11 NOTE — Telephone Encounter (Signed)
Returned call and pt verified x 2.  Pt informed message received and RN confirmed pt needs refills on Viagra and K+.  Pt informed refills will be sent.  Pt wants 90-day for K+ and 30-day for Viagra.  Refill(s) sent to pharmacy.

## 2014-01-11 NOTE — Telephone Encounter (Signed)
Paper chart requested.

## 2014-01-11 NOTE — Telephone Encounter (Signed)
Patient needs the Klor-Con instead of the HCTZ

## 2014-01-11 NOTE — Telephone Encounter (Signed)
Pt was calling because the pharmacy still has not received his refill for Viagra and HTCZ. He stated that they faxed over the request 2.  Gregory [name]

## 2014-01-20 ENCOUNTER — Telehealth: Payer: Self-pay | Admitting: Cardiovascular Disease

## 2014-01-20 MED ORDER — AMLODIPINE BESYLATE 10 MG PO TABS: 10.0000 mg | ORAL_TABLET | Freq: Every day | ORAL | Status: DC

## 2014-01-20 NOTE — Telephone Encounter (Signed)
Returned call and pt verified x 2.  Pt stated he just got off the phone w/ the insurance company and was told it would be ready in an hour.  Pt informed RN will call pharmacy to confirm and call him back.  Call to pharmacy and informed Coreg will be ready in an hour.  Stated they were filing the wrong insurance number.  Also stated pt needs a refill on amlodipine and clopidogrel.  Will send electronically.  Call to pt and informed.  Pt stated he does not need clopidogrel, but does need amlodipine.  Informed will send in refill for amlodipine.  Pt verbalized understanding and agreed w/ plan.  Refill(s) sent to pharmacy.

## 2014-01-20 NOTE — Telephone Encounter (Signed)
Forwarded to Barbara, CMA.  

## 2014-01-20 NOTE — Telephone Encounter (Signed)
He need prior authorization for his Coreg 80 mg please.

## 2014-01-20 NOTE — Telephone Encounter (Signed)
Please call his Coreg in today. He is completely out of it.

## 2014-02-26 ENCOUNTER — Other Ambulatory Visit: Payer: Self-pay | Admitting: Cardiovascular Disease

## 2014-02-26 NOTE — Telephone Encounter (Signed)
Pt called re: ICD firing x 2 today. He "flipping a switch under the hood of my car" when the device fired two times after he was seated at rest inside the (stopped) vehicle. He denies precipitating chest pain, SOB/DOE, weight gain, LE edema, abdominal distention, PND or orthopnea. He feels well and at baseline. He has not yet set up home telephonic monitoring. Advised to continue to monitor today and rest. ? Magnetism under hood of the car. Will leave message at [facility] office to have him in for an interrogation. He has had problems with an atrial lead in the past, but not v-lead. If ICD continues to fire at all today, advised to have someone drive him to the nearest ED. He understood and agreed.    Jacquelynn Cree, PA-C 02/26/2014 3:55 PM

## 2014-02-27 ENCOUNTER — Ambulatory Visit (INDEPENDENT_AMBULATORY_CARE_PROVIDER_SITE_OTHER): Payer: Worker's Compensation | Admitting: Cardiovascular Disease

## 2014-02-27 ENCOUNTER — Other Ambulatory Visit: Payer: Self-pay | Admitting: Physician Assistant

## 2014-02-27 ENCOUNTER — Encounter: Payer: Self-pay | Admitting: Cardiovascular Disease

## 2014-02-27 ENCOUNTER — Telehealth (HOSPITAL_COMMUNITY): Payer: Self-pay | Admitting: *Deleted

## 2014-02-27 VITALS — BP 118/88 | HR 74 | Ht 78.0 in | Wt 300.1 lb

## 2014-02-27 DIAGNOSIS — I472 Ventricular tachycardia, unspecified: Secondary | ICD-10-CM

## 2014-02-27 DIAGNOSIS — I2510 Atherosclerotic heart disease of native coronary artery without angina pectoris: Secondary | ICD-10-CM

## 2014-02-27 DIAGNOSIS — I2589 Other forms of chronic ischemic heart disease: Secondary | ICD-10-CM

## 2014-02-27 DIAGNOSIS — I4729 Other ventricular tachycardia: Secondary | ICD-10-CM

## 2014-02-27 DIAGNOSIS — I255 Ischemic cardiomyopathy: Secondary | ICD-10-CM

## 2014-02-27 DIAGNOSIS — Z79899 Other long term (current) drug therapy: Secondary | ICD-10-CM

## 2014-02-27 DIAGNOSIS — E782 Mixed hyperlipidemia: Secondary | ICD-10-CM

## 2014-02-27 DIAGNOSIS — I509 Heart failure, unspecified: Secondary | ICD-10-CM

## 2014-02-27 DIAGNOSIS — I5042 Chronic combined systolic (congestive) and diastolic (congestive) heart failure: Secondary | ICD-10-CM

## 2014-02-27 LAB — MDC_IDC_ENUM_SESS_TYPE_INCLINIC
Battery Voltage: 2.93 V
Brady Statistic AS VP Percent: 0.51 %
Brady Statistic RA Percent Paced: 0 %
Brady Statistic RV Percent Paced: 0.51 %
Date Time Interrogation Session: 20150316114408
HighPow Impedance: 46 Ohm
HighPow Impedance: 54 Ohm
Lead Channel Impedance Value: 494 Ohm
Lead Channel Impedance Value: 551 Ohm
Lead Channel Sensing Intrinsic Amplitude: 21.125 mV
Lead Channel Sensing Intrinsic Amplitude: 24.75 mV
Lead Channel Sensing Intrinsic Amplitude: 5 mV
Lead Channel Sensing Intrinsic Amplitude: 5.75 mV
MDC IDC MSMT LEADCHNL RV PACING THRESHOLD AMPLITUDE: 1 V
MDC IDC MSMT LEADCHNL RV PACING THRESHOLD PULSEWIDTH: 0.4 ms
MDC IDC SET LEADCHNL RV PACING AMPLITUDE: 2.5 V
MDC IDC SET LEADCHNL RV PACING PULSEWIDTH: 0.4 ms
MDC IDC SET LEADCHNL RV SENSING SENSITIVITY: 0.6 mV
MDC IDC SET ZONE DETECTION INTERVAL: 350 ms
MDC IDC STAT BRADY AP VP PERCENT: 0 %
MDC IDC STAT BRADY AP VS PERCENT: 0 %
MDC IDC STAT BRADY AS VS PERCENT: 99.49 %
Zone Setting Detection Interval: 300 ms
Zone Setting Detection Interval: 360 ms
Zone Setting Detection Interval: 450 ms

## 2014-02-27 LAB — PACEMAKER DEVICE OBSERVATION

## 2014-02-27 NOTE — Assessment & Plan Note (Signed)
He has been going to the gym without any problems. Clinically appears euvolemic. Thoracic impedances in the normal range without any recent  increase. He is on maximum doses of ACE inhibitor and carvedilol. He does not require loop diuretic therapy.

## 2014-02-27 NOTE — Patient Instructions (Signed)
You have been referred to [facility] Caryl Comes, an electrophysiologist, at the [facility] location for your recent arrhythmia.  Your physician recommends that you return for lab work today.  Your physician recommends that you schedule a follow-up appointment in: 3 months with Dr.Croitoru

## 2014-02-27 NOTE — Assessment & Plan Note (Signed)
He has a dense inferior wall scar by ECG and previous nuclear perfusion testing. This may be the nidus for his arrhythmia. He does not have any symptoms of acute coronary insufficiency at this time. Note that he has not had pain with his ischemic heart disease in the past. While beta blocker rebound may be the cause for his events, it is probably reasonable to reevaluate for coronary insufficiency. We'll schedule for a [employer].

## 2014-02-27 NOTE — Assessment & Plan Note (Signed)
It is possible that his ventricular tachycardia was related to beta blocker withdrawal and we will make sure that he starts receiving his beta blocker today.  I have made a referral to Dr. Jolyn Nap for electrophysiology evaluation. Dr. Caryl Comes actually placed a new ICD lead in 2010 (malfunctioning sprint fidelis lead). For the time being I don't think we need to recommend antiarrhythmic therapy. I did not make any change to his device settings. I think antitachycardia pacing could not be delivered due to the very short arrhythmia cycle length.  I notified Gregory [name] that he should not drive for the next 6 months.

## 2014-02-27 NOTE — Progress Notes (Signed)
Patient ID: Gregory [name], male   DOB: 1961/10/13, 53 y.o.   MRN: [id number]      Reason for office visit Defibrillator discharge  The following note was documented under prescription refills in his chart:  "Pt called re: ICD firing x 2 today. He "flipping a switch under the hood of my car" when the device fired two times after he was seated at rest inside the (stopped) vehicle. He denies precipitating chest pain, SOB/DOE, weight gain, LE edema, abdominal distention, PND or orthopnea. He feels well and at baseline. He has not yet set up home telephonic monitoring. Advised to continue to monitor today and rest. ? Magnetism under hood of the car. Will leave message at [facility] office to have him in for an interrogation. He has had problems with an atrial lead in the past, but not v-lead. If ICD continues to fire at all today, advised to have someone drive him to the nearest ED. He understood and agreed. "  R. Valeria Batman, PA-C  02/26/2014  3:55 PM  He had an episode of sustained monomorphic ventricular tachycardia with a rate of 273 beats per minute. He was sitting down at the time and did not feel dizzy and then experienced syncope. He felt 2 consecutive defibrillator shocks. The arrhythmia was modified by the first shock, recurring with a different electrogram at a slower rate but subsequently re-organizing to the original pattern. The second shock led to a "clean break". He did not have any other cardiac symptoms and feels well today.   He has not had intercurrent illnesses with the exception of a "flu" that began resolving last week. He missed 2 doses of Coreg CR before the shocks occurred since his prescription had run out. Interestingly, he had been fiddling with the electrical circuits underneath the hood of his car just before the arrhythmia occurred. I do not see any sign of electromagnetic interference.   Gregory [name] has had painless ischemic cardiomyopathy. He presented with  congestive heart failure in 2005 in [place]. He was treated with diuretics and cardiac catheterization showed an occluded right coronary artery. He received a defibrillator. I am not sure whether this was implanted for primary prevention or whether he had documented sustained VT. At this point in time his left ventricular ejection fraction is only mildly to moderately depressed at 40-45%. He has not had congestive heart failure since his initial presentation, once was placed on appropriate medications. He does not have any angina either at rest or with exertion and has excellent functional status (NYHA class I currently). He exercises at the gym most days of the week. He has chronic total occlusion of the right coronary artery and in the remote past received a drug-eluting stents to the circumflex and diagonal arteries in 2005 (taxus, size unknown). He has not required repeat angiography. His last functional study was in October of 2011 and showed an extensive infero-or an inferolateral scar with a left ventricular ejection fraction of 37%. No reversible ischemia was seen. His last echocardiogram was performed in March of 2014 showed an ejection fraction of 45-50% with inferior hypokinesis. Has moderate LVH. The left atrium was described as being moderately dilated. There were no significant valvular abnormalities.  He is on appropriate treatment with ACE inhibitors and carvedilol and the only diuretic he takes his hydrochlorothiazide. In addition he needs hydralazine amlodipine for blood pressure control. He is on chronic antiplatelet therapy with aspirin and clopidogrel and takes high-dose atorvastatin for hypercholesterolemia.  He  is moderately obese but is also very broad shouldered and muscular gentleman. BMI is >35.    No Known Allergies  Current Outpatient Prescriptions  Medication Sig Dispense Refill  . amLODipine (NORVASC) 10 MG tablet Take 1 tablet (10 mg total) by mouth daily.  30 tablet   5  . aspirin 81 MG tablet Take 81 mg by mouth daily.      Marland Kitchen atorvastatin (LIPITOR) 80 MG tablet Take 1 tablet (80 mg total) by mouth daily.  90 tablet  3  . clopidogrel (PLAVIX) 75 MG tablet Take 75 mg by mouth daily with breakfast.      . COREG CR 80 MG 24 hr capsule TAKE 1 CAPSULE BY MOUTH EVERY DAY.  30 capsule  0  . hydrALAZINE (APRESOLINE) 100 MG tablet Take 100 mg by mouth 2 (two) times daily.      . hydrochlorothiazide (HYDRODIURIL) 25 MG tablet Take 1 tablet (25 mg total) by mouth daily.  90 tablet  1  . potassium chloride (K-DUR,KLOR-CON) 10 MEQ tablet Take 1 tablet (10 mEq total) by mouth daily.  90 tablet  2  . ramipril (ALTACE) 10 MG capsule TAKE 2 CAPSULES BY MOUTH EVERY DAY  60 capsule  11  . sildenafil (VIAGRA) 50 MG tablet Take 1 tablet (50 mg total) by mouth daily as needed.  30 tablet  5   No current facility-administered medications for this visit.    Past Medical History  Diagnosis Date  . CAD (coronary artery disease) 12/01/2013  . Ischemic cardiomyopathy 12/01/2013  . Chronic combined systolic and diastolic CHF, NYHA class 1 12/01/2013  . Erectile dysfunction 12/01/2013  . HTN (hypertension) 12/01/2013  . Hyperlipidemia 12/01/2013    No past surgical history on file.  No family history on file.  History   Social History  . Marital Status: Single    Spouse Name: N/A    Number of Children: N/A  . Years of Education: N/A   Occupational History  . Not on file.   Social History Main Topics  . Smoking status: Former Smoker -- 0.50 packs/day for 30 years    Types: Cigarettes    Quit date: 05/31/2013  . Smokeless tobacco: Not on file  . Alcohol Use: No  . Drug Use: Not on file  . Sexual Activity: Not on file   Other Topics Concern  . Not on file   Social History Narrative  . No narrative on file    Review of systems: The patient specifically denies any chest pain at rest or with exertion, dyspnea at rest or with exertion, orthopnea, paroxysmal  nocturnal dyspnea, syncope, palpitations, focal neurological deficits, intermittent claudication, lower extremity edema, unexplained weight gain, cough, hemoptysis or wheezing.  The patient also denies abdominal pain, nausea, vomiting, dysphagia, diarrhea, constipation, polyuria, polydipsia, dysuria, hematuria, frequency, urgency, abnormal bleeding or bruising, fever, chills, unexpected weight changes, mood swings, change in skin or hair texture, change in voice quality, auditory or visual problems, allergic reactions or rashes, new musculoskeletal complaints other than usual "aches and pains".   PHYSICAL EXAM BP 118/88  Pulse 74  Ht _0  (1.981 m)  Wt 136.124 kg (300 lb 1.6 oz)  BMI 34.69 kg/m2  General: Alert, oriented x3, no distress Head: no evidence of trauma, PERRL, EOMI, no exophtalmos or lid lag, no myxedema, no xanthelasma; normal ears, nose and oropharynx Neck: normal jugular venous pulsations and no hepatojugular reflux; brisk carotid pulses without delay and no carotid bruits Chest: clear to auscultation, no  signs of consolidation by percussion or palpation, normal fremitus, symmetrical and full respiratory excursions; the appearance of the subclavian pacemaker site Cardiovascular: normal position and quality of the apical impulse, regular rhythm, normal first and second heart sounds, no murmurs, rubs or gallops Abdomen: no tenderness or distention, no masses by palpation, no abnormal pulsatility or arterial bruits, normal bowel sounds, no hepatosplenomegaly Extremities: no clubbing, cyanosis or edema; 2+ radial, ulnar and brachial pulses bilaterally; 2+ right femoral, posterior tibial and dorsalis pedis pulses; 2+ left femoral, posterior tibial and dorsalis pedis pulses; no subclavian or femoral bruits Neurological: grossly nonfocal   EKG: Sinus rhythm with very frequent PVCs and a ventricular couplet, left atrial abnormality, old inferior infarction with left axis deviation and  poor R-wave progression across the anterior precordium  BMET    Component Value Date/Time   NA 139 11/01/2009 0520   K 3.4* 11/01/2009 0520   CL 106 11/01/2009 0520   CO2 26 11/01/2009 0520   GLUCOSE 113* 11/01/2009 0520   BUN 15 11/01/2009 0520   CREATININE 0.99 11/01/2009 0520   CALCIUM 8.5 11/01/2009 0520   GFRNONAA >60 11/01/2009 0520   GFRAA  Value: >60        The eGFR has been calculated using the MDRD equation. This calculation has not been validated in all clinical situations. eGFR's persistently <60 mL/min signify possible Chronic Kidney Disease. 11/01/2009 0520     ASSESSMENT AND PLAN Sustained ventricular tachycardia It is possible that his ventricular tachycardia was related to beta blocker withdrawal and we will make sure that he starts receiving his beta blocker today.  I have made a referral to Dr. Jolyn Nap for electrophysiology evaluation. Dr. Caryl Comes actually placed a new ICD lead in 2010 (malfunctioning sprint fidelis lead). For the time being I don't think we need to recommend antiarrhythmic therapy. I did not make any change to his device settings. I think antitachycardia pacing could not be delivered due to the very short arrhythmia cycle length.  I notified Mr. Kimmel that he should not drive for the next 6 months.  Chronic combined systolic and diastolic CHF, NYHA class 1  He has been going to the gym without any problems. Clinically appears euvolemic. Thoracic impedances in the normal range without any recent  increase. He is on maximum doses of ACE inhibitor and carvedilol. He does not require loop diuretic therapy.  Ischemic cardiomyopathy He has a dense inferior wall scar by ECG and previous nuclear perfusion testing. This may be the nidus for his arrhythmia. He does not have any symptoms of acute coronary insufficiency at this time. Note that he has not had pain with his ischemic heart disease in the past. While beta blocker rebound may be the cause for his  events, it is probably reasonable to reevaluate for coronary insufficiency. We'll schedule for a [employer].   Orders Placed This Encounter  Procedures  . Magnesium  . Comp Met (CMET)  . Lipid Profile  . Ambulatory referral to Cardiac Electrophysiology  . EKG 12-Lead   No orders of the defined types were placed in this encounter.    Holli Humbles, MD, [place] [phone] office [phone] pager

## 2014-02-27 NOTE — Telephone Encounter (Signed)
Rx was sent to pharmacy electronically. 

## 2014-03-01 LAB — COMPREHENSIVE METABOLIC PANEL
ALT: 32 U/L (ref 0–53)
AST: 22 U/L (ref 0–37)
Albumin: 4.1 g/dL (ref 3.5–5.2)
Alkaline Phosphatase: 85 U/L (ref 39–117)
BUN: 12 mg/dL (ref 6–23)
CO2: 28 mEq/L (ref 19–32)
Calcium: 8.7 mg/dL (ref 8.4–10.5)
Chloride: 105 mEq/L (ref 96–112)
Creat: 1.03 mg/dL (ref 0.50–1.35)
Glucose, Bld: 125 mg/dL — ABNORMAL HIGH (ref 70–99)
Potassium: 4 mEq/L (ref 3.5–5.3)
Sodium: 144 mEq/L (ref 135–145)
Total Bilirubin: 0.4 mg/dL (ref 0.2–1.2)
Total Protein: 6.8 g/dL (ref 6.0–8.3)

## 2014-03-01 LAB — LIPID PANEL
Cholesterol: 116 mg/dL (ref 0–200)
HDL: 25 mg/dL — ABNORMAL LOW (ref >39)
LDL Cholesterol: 80 mg/dL (ref 0–99)
Total CHOL/HDL Ratio: 4.6 Ratio
Triglycerides: 54 mg/dL (ref <150)
VLDL: 11 mg/dL (ref 0–40)

## 2014-03-01 LAB — HEMOGLOBIN A1C
Hgb A1c MFr Bld: 5.7 % — ABNORMAL HIGH (ref <5.7)
Mean Plasma Glucose: 117 mg/dL — ABNORMAL HIGH (ref <117)

## 2014-03-03 ENCOUNTER — Encounter: Payer: Self-pay | Admitting: *Deleted

## 2014-03-06 ENCOUNTER — Encounter: Payer: Self-pay | Admitting: Internal Medicine

## 2014-03-06 ENCOUNTER — Ambulatory Visit (INDEPENDENT_AMBULATORY_CARE_PROVIDER_SITE_OTHER): Payer: Worker's Compensation | Admitting: Internal Medicine

## 2014-03-06 ENCOUNTER — Encounter: Payer: Self-pay | Admitting: *Deleted

## 2014-03-06 VITALS — BP 162/91 | HR 60 | Ht 78.0 in | Wt 309.0 lb

## 2014-03-06 DIAGNOSIS — I472 Ventricular tachycardia, unspecified: Secondary | ICD-10-CM

## 2014-03-06 DIAGNOSIS — I5042 Chronic combined systolic (congestive) and diastolic (congestive) heart failure: Secondary | ICD-10-CM

## 2014-03-06 DIAGNOSIS — I255 Ischemic cardiomyopathy: Secondary | ICD-10-CM

## 2014-03-06 DIAGNOSIS — Z95810 Presence of automatic (implantable) cardiac defibrillator: Secondary | ICD-10-CM

## 2014-03-06 DIAGNOSIS — I4729 Other ventricular tachycardia: Secondary | ICD-10-CM

## 2014-03-06 DIAGNOSIS — I2589 Other forms of chronic ischemic heart disease: Secondary | ICD-10-CM

## 2014-03-06 DIAGNOSIS — I509 Heart failure, unspecified: Secondary | ICD-10-CM

## 2014-03-06 LAB — MDC_IDC_ENUM_SESS_TYPE_INCLINIC
Battery Voltage: 2.96 V
Brady Statistic AP VP Percent: 0 %
Brady Statistic AS VP Percent: 0.34 %
Brady Statistic RA Percent Paced: 0 %
Brady Statistic RV Percent Paced: 0.34 %
HIGH POWER IMPEDANCE MEASURED VALUE: 47 Ohm
HIGH POWER IMPEDANCE MEASURED VALUE: 57 Ohm
Lead Channel Pacing Threshold Amplitude: 1 V
Lead Channel Pacing Threshold Pulse Width: 0.4 ms
Lead Channel Sensing Intrinsic Amplitude: 18.25 mV
Lead Channel Setting Pacing Amplitude: 2.5 V
Lead Channel Setting Sensing Sensitivity: 0.6 mV
MDC IDC MSMT LEADCHNL RA IMPEDANCE VALUE: 589 Ohm
MDC IDC MSMT LEADCHNL RA SENSING INTR AMPL: 4.75 mV
MDC IDC MSMT LEADCHNL RV IMPEDANCE VALUE: 494 Ohm
MDC IDC SESS DTM: 20150323110634
MDC IDC SET LEADCHNL RV PACING PULSEWIDTH: 0.4 ms
MDC IDC SET ZONE DETECTION INTERVAL: 300 ms
MDC IDC STAT BRADY AP VS PERCENT: 0 %
MDC IDC STAT BRADY AS VS PERCENT: 99.66 %
Zone Setting Detection Interval: 350 ms
Zone Setting Detection Interval: 360 ms
Zone Setting Detection Interval: 450 ms

## 2014-03-06 NOTE — Progress Notes (Signed)
Patient Care Team: No Pcp Per Patient as PCP - General (General Practice)   HPI  Gregory [name] is a 53 y.o. male Seen at the request Dr. C. because of ICD discharges. He underwent ICD lead revision 2010 because of a 6949-lead.  He has a history of ischemic cardiomyopathy prior heart failure. Catheterization clear right coronary artery ICD. Ejection fraction 2005 was about 45%.  2000 and demonstrated extensive inferolateral scar without ischemia; ejection fraction 37%. Repeat echo 2014 demonstrated ejection fraction 45-50%.  He has been scheduled for a Myoview.  His episodes are assoc with long shorting occuring in the setting of having missed his coreg  Past Medical History  Diagnosis Date  . CAD (coronary artery disease) 12/01/2013  . Ischemic cardiomyopathy 12/01/2013  . Chronic combined systolic and diastolic CHF, NYHA class 1 12/01/2013  . Erectile dysfunction 12/01/2013  . HTN (hypertension) 12/01/2013  . Hyperlipidemia 12/01/2013    Past Surgical History  Procedure Laterality Date  . Cardiac defibrillator placement    . Wrist tenodesis    . Achilles tendon repair      lft foot    Current Outpatient Prescriptions  Medication Sig Dispense Refill  . amLODipine (NORVASC) 10 MG tablet Take 1 tablet (10 mg total) by mouth daily.  30 tablet  5  . aspirin 81 MG tablet Take 81 mg by mouth daily.      Marland Kitchen atorvastatin (LIPITOR) 80 MG tablet Take 1 tablet (80 mg total) by mouth daily.  90 tablet  3  . clopidogrel (PLAVIX) 75 MG tablet Take 75 mg by mouth daily with breakfast.      . COREG CR 80 MG 24 hr capsule TAKE 1 CAPSULE BY MOUTH EVERY DAY  30 capsule  5  . hydrALAZINE (APRESOLINE) 100 MG tablet Take 100 mg by mouth 2 (two) times daily.      . hydrochlorothiazide (HYDRODIURIL) 25 MG tablet Take 1 tablet (25 mg total) by mouth daily.  90 tablet  1  . potassium chloride (K-DUR,KLOR-CON) 10 MEQ tablet Take 1 tablet (10 mEq total) by mouth daily.  90 tablet   2  . ramipril (ALTACE) 10 MG capsule TAKE 2 CAPSULES BY MOUTH EVERY DAY  60 capsule  11  . sildenafil (VIAGRA) 50 MG tablet Take 1 tablet (50 mg total) by mouth daily as needed.  30 tablet  5   No current facility-administered medications for this visit.    No Known Allergies  Review of Systems negative except from HPI and PMH  Physical Exam BP 162/91  Pulse 60  Ht 6\' 6"  (1.981 m)  Wt 309 lb (140.161 kg)  BMI 35.72 kg/m2 Well developed and well nourished in no acute distress HENT normal E scleral and icterus clear Neck Supple JVP flat; carotids brisk and full Clear to ausculation Device pocket well healed; without hematoma or erythema.  There is no tethering   Regular rate and rhythm, no murmurs gallops or rub Soft with active bowel sounds No clubbing cyanosis  Edema Alert and oriented, grossly normal motor and sensory function Skin Warm and Dry    Assessment and  Plan  Ventricular Tachycardia  Ischemic cardiomyopathy  ICD-Medtronic The patient's device was interrogated and the information was fully reviewed.  The device was reprogrammed to decrease PVAB so as to hopefully allow P. synchronous pacing and thereby shorten the long short coupling interval that was associated with tachycardia  The patient had recurrent ventricular tachycardia at a cycle with  of about 220 ms. He had a nonsustained episode may 2014 that was also long short coupled as was this one, that was polymorphic. This one was monomorphic. I failed to remember to reprogram his ATP during charging to a cycle length of 200 ms. This is traditionally been left at nominal settings with  a cycle length at 250s milliseconds

## 2014-03-06 NOTE — Patient Instructions (Signed)
Your physician recommends that you continue on your current medications as directed. Please refer to the Current Medication list given to you today.  Your physician recommends that you schedule a follow-up appointment in: 3 months with Dr. Klein.  

## 2014-03-09 ENCOUNTER — Ambulatory Visit (INDEPENDENT_AMBULATORY_CARE_PROVIDER_SITE_OTHER): Payer: Worker's Compensation | Admitting: *Deleted

## 2014-03-09 DIAGNOSIS — I2589 Other forms of chronic ischemic heart disease: Secondary | ICD-10-CM

## 2014-03-09 DIAGNOSIS — I4729 Other ventricular tachycardia: Secondary | ICD-10-CM

## 2014-03-09 DIAGNOSIS — I472 Ventricular tachycardia, unspecified: Secondary | ICD-10-CM

## 2014-03-09 DIAGNOSIS — I255 Ischemic cardiomyopathy: Secondary | ICD-10-CM

## 2014-03-09 LAB — MDC_IDC_ENUM_SESS_TYPE_INCLINIC
Battery Voltage: 2.96 V
Brady Statistic AS VP Percent: 0 %
Brady Statistic RA Percent Paced: 7.76 %
Brady Statistic RV Percent Paced: 0.11 %
HighPow Impedance: 47 Ohm
HighPow Impedance: 58 Ohm
Lead Channel Sensing Intrinsic Amplitude: 18.5 mV
Lead Channel Sensing Intrinsic Amplitude: 4.75 mV
Lead Channel Setting Pacing Amplitude: 2.5 V
Lead Channel Setting Pacing Amplitude: 4.5 V
Lead Channel Setting Pacing Pulse Width: 0.4 ms
Lead Channel Setting Sensing Sensitivity: 0.6 mV
MDC IDC MSMT LEADCHNL RA IMPEDANCE VALUE: 551 Ohm
MDC IDC MSMT LEADCHNL RA SENSING INTR AMPL: 4.75 mV
MDC IDC MSMT LEADCHNL RV IMPEDANCE VALUE: 494 Ohm
MDC IDC MSMT LEADCHNL RV SENSING INTR AMPL: 18.5 mV
MDC IDC SESS DTM: 20150326160704
MDC IDC SET ZONE DETECTION INTERVAL: 300 ms
MDC IDC STAT BRADY AP VP PERCENT: 0.11 %
MDC IDC STAT BRADY AP VS PERCENT: 7.65 %
MDC IDC STAT BRADY AS VS PERCENT: 92.24 %
Zone Setting Detection Interval: 350 ms
Zone Setting Detection Interval: 360 ms
Zone Setting Detection Interval: 450 ms

## 2014-03-09 NOTE — Progress Notes (Signed)
Device reprogrammed per Dr. Caryl Comes.  ATP during charging reprogrammed to 228ms.

## 2014-03-15 ENCOUNTER — Ambulatory Visit (HOSPITAL_COMMUNITY)
Admission: RE | Admit: 2014-03-15 | Discharge: 2014-03-15 | Disposition: A | Discharge status: Home / Self Care | Payer: Worker's Compensation | Source: Ambulatory Visit | Attending: Cardiovascular Disease | Admitting: Cardiovascular Disease

## 2014-03-15 ENCOUNTER — Telehealth: Payer: Self-pay | Admitting: Cardiovascular Disease

## 2014-03-15 DIAGNOSIS — I255 Ischemic cardiomyopathy: Secondary | ICD-10-CM

## 2014-03-15 DIAGNOSIS — I2589 Other forms of chronic ischemic heart disease: Secondary | ICD-10-CM | POA: Insufficient documentation

## 2014-03-15 DIAGNOSIS — I472 Ventricular tachycardia, unspecified: Secondary | ICD-10-CM

## 2014-03-15 DIAGNOSIS — I2510 Atherosclerotic heart disease of native coronary artery without angina pectoris: Secondary | ICD-10-CM

## 2014-03-15 DIAGNOSIS — I4729 Other ventricular tachycardia: Secondary | ICD-10-CM | POA: Insufficient documentation

## 2014-03-15 MED ORDER — TECHNETIUM TC 99M SESTAMIBI GENERIC - CARDIOLITE
30.9000 | Freq: Once | INTRAVENOUS | Status: AC | PRN
  Administered 2014-03-15: 30.9 via INTRAVENOUS

## 2014-03-15 MED ORDER — REGADENOSON 0.4 MG/5ML IV SOLN
0.4000 mg | Freq: Once | INTRAVENOUS | Status: AC
  Administered 2014-03-15: 0.4 mg via INTRAVENOUS

## 2014-03-15 MED ORDER — TECHNETIUM TC 99M SESTAMIBI GENERIC - CARDIOLITE
10.8000 | Freq: Once | INTRAVENOUS | Status: AC | PRN
  Administered 2014-03-15: 11 via INTRAVENOUS

## 2014-03-15 MED ORDER — AMINOPHYLLINE 25 MG/ML IV SOLN
75.0000 mg | Freq: Once | INTRAVENOUS | Status: AC
  Administered 2014-03-15: 75 mg via INTRAVENOUS

## 2014-03-15 NOTE — Telephone Encounter (Signed)
Please call,having problem getting his generic Plavix and Potassium.Would you please call it in to [phone].The pharmacist said they had faxed it to you.

## 2014-03-15 NOTE — Procedures (Addendum)
[facility] [address] [place] [address] [phone]  Cardiology Nuclear Med Study  Gregory [name] [place]ourd [place] is a 53 y.o. male     MRN : [id number]     DOB: 1961/10/29  Procedure Date: 03/15/2014  Nuclear Med Background Indication for Stress Test:  Stent Patency, Abnormal EKG and PVC's with T-wave abnormality History:  CAD-12/01/2013;MI;STENT/PTCA-2005;PACER/AICD;CHF;ischemic cardiomyopathy;sustained VT;Last Stress MPI on 10/10/2010-scar;EF=37%;ECHO on 02/2013-inferior hypokenisis;EF=45-50% Cardiac Risk Factors: Family History - CAD, History of Smoking, Hypertension, Lipids and Obesity  Symptoms:  Pt denies symptoms but states his difibrillator fired recently.   Nuclear Pre-Procedure Caffeine/Decaff Intake:  8:00pm NPO After: 6:00am   IV Site: R Hand  IV 0.9% NS with Angio Cath:  22g  Chest Size (in):  46"  IV Started by: Azucena Cecil, RN  Height: 6\' 6"  (1.981 m)  Cup Size: n/a  BMI:  Body mass index is 34.68 kg/(m^2). Weight:  300 lb (136.079 kg)   Tech Comments:  n/a    Nuclear Med Study 1 or 2 day study: 1 day  Stress Test Type:  [place]  Order Authorizing Provider:  Sanda Klein, MD   Resting Radionuclide: Technetium 29m Sestamibi  Resting Radionuclide Dose: 10.8 mCi   Stress Radionuclide:  Technetium 60m Sestamibi  Stress Radionuclide Dose: 30.9 mCi           Stress Protocol Rest HR: 64 Stress HR: 76  Rest BP: 137/92 Stress BP: 146/99  Exercise Time (min): n/a METS: n/a   Predicted Max HR: 165 bpm % Max HR: 53.94 bpm Rate Pressure Product: 13973  Dose of Adenosine (mg):  n/a Dose of Lexiscan: 0.4 mg  Dose of Atropine (mg): n/a Dose of Dobutamine: n/a mcg/kg/min (at max HR)  Stress Test Technologist: Leane Para, CCT Nuclear Technologist: Imagene Riches, CNMT   Rest Procedure:  Myocardial perfusion imaging was performed at rest 45 minutes following the intravenous administration of Technetium  58m Sestamibi. Stress Procedure:  The patient received IV Lexiscan 0.4 mg over 15-seconds.  Technetium 22m Sestamibi injected IV at 30-second. Patient experienced mild SOB and was administered 75 mg of IV Aminophylline IV at 5 minutes..  There were no significant changes with Lexiscan.  Quantitative spect images were obtained after a 45 minute delay.  Transient Ischemic Dilatation (Normal <1.22):  1.14 Lung/Heart Ratio (Normal <0.45):  0.30  QGS EDV:  n/a ml QGS ESV:  n/a ml LV Ejection Fraction: not gated       Rest ECG: SR with frequent PVCs and couplets, old inferior MI  Stress ECG: No significant change from baseline ECG  QPS Raw Data Images:  Normal; no motion artifact; normal heart/lung ratio. Stress Images:  There is severely decreased uptake in the entire inferior and inferolateral wall. Rest Images:  Comparison with the stress images reveals no significant change. Subtraction (SDS):  There is a fixed defect that is most consistent with a previous infarction. LV Wall Motion:  non gated study due to ectopy  Impression Exercise Capacity:  Lexiscan with no exercise. BP Response:  Normal blood pressure response. Clinical Symptoms:  No significant symptoms noted. ECG Impression:  No significant ECG changes with Lexiscan. Comparison with Prior Nuclear Study: No significant change from previous study   Overall Impression:  Low risk stress nuclear study with a large and severe scar in the right coronary artery territory.Sanda Klein, MD  03/15/2014 12:51 PM

## 2014-03-15 NOTE — Telephone Encounter (Signed)
Call to pharmacy and left message to fax a hard copy refill request to office and previous cardiologist retired and we cannot receive electronic requests.

## 2014-03-16 MED ORDER — CLOPIDOGREL BISULFATE 75 MG PO TABS: 75.0000 mg | ORAL_TABLET | Freq: Every day | ORAL | Status: DC

## 2014-03-16 NOTE — Telephone Encounter (Signed)
Refill(s) sent to pharmacy for clopidogrel w/ message to look for K+ script sent on 1.28.15.

## 2014-04-07 ENCOUNTER — Other Ambulatory Visit: Payer: Self-pay | Admitting: Cardiovascular Disease

## 2014-04-17 ENCOUNTER — Other Ambulatory Visit: Payer: Self-pay | Admitting: Cardiovascular Disease

## 2014-04-18 NOTE — Telephone Encounter (Signed)
Rx was sent to pharmacy electronically. 

## 2014-04-21 ENCOUNTER — Encounter: Payer: Self-pay | Admitting: Cardiovascular Disease

## 2014-04-21 ENCOUNTER — Encounter: Payer: Self-pay | Admitting: Internal Medicine

## 2014-06-03 ENCOUNTER — Telehealth: Payer: Self-pay | Admitting: Cardiovascular Disease

## 2014-06-03 ENCOUNTER — Encounter: Payer: Self-pay | Admitting: Cardiovascular Disease

## 2014-06-07 ENCOUNTER — Encounter: Payer: Self-pay | Admitting: Internal Medicine

## 2014-06-08 ENCOUNTER — Encounter: Payer: Self-pay | Admitting: Internal Medicine

## 2014-06-08 ENCOUNTER — Ambulatory Visit (INDEPENDENT_AMBULATORY_CARE_PROVIDER_SITE_OTHER): Payer: Worker's Compensation | Admitting: Internal Medicine

## 2014-06-08 VITALS — BP 138/76 | HR 75 | Ht 78.0 in | Wt 300.0 lb

## 2014-06-08 DIAGNOSIS — I2589 Other forms of chronic ischemic heart disease: Secondary | ICD-10-CM

## 2014-06-08 DIAGNOSIS — I4729 Other ventricular tachycardia: Secondary | ICD-10-CM

## 2014-06-08 DIAGNOSIS — I472 Ventricular tachycardia, unspecified: Secondary | ICD-10-CM

## 2014-06-08 DIAGNOSIS — I509 Heart failure, unspecified: Secondary | ICD-10-CM

## 2014-06-08 DIAGNOSIS — I255 Ischemic cardiomyopathy: Secondary | ICD-10-CM

## 2014-06-08 DIAGNOSIS — I5042 Chronic combined systolic (congestive) and diastolic (congestive) heart failure: Secondary | ICD-10-CM

## 2014-06-08 LAB — MDC_IDC_ENUM_SESS_TYPE_INCLINIC
Battery Voltage: 3 V
Brady Statistic AP VP Percent: 0.17 %
Brady Statistic AS VS Percent: 88.2 %
HighPow Impedance: 46 Ohm
HighPow Impedance: 58 Ohm
Lead Channel Impedance Value: 532 Ohm
Lead Channel Pacing Threshold Amplitude: 1 V
Lead Channel Pacing Threshold Pulse Width: 0.4 ms
Lead Channel Setting Pacing Amplitude: 2.5 V
Lead Channel Setting Pacing Amplitude: 4.5 V
Lead Channel Setting Pacing Pulse Width: 0.4 ms
Lead Channel Setting Sensing Sensitivity: 0.6 mV
MDC IDC MSMT LEADCHNL RA PACING THRESHOLD AMPLITUDE: 2.25 V
MDC IDC MSMT LEADCHNL RA PACING THRESHOLD PULSEWIDTH: 1 ms
MDC IDC MSMT LEADCHNL RA SENSING INTR AMPL: 5.3 mV
MDC IDC MSMT LEADCHNL RV IMPEDANCE VALUE: 494 Ohm
MDC IDC MSMT LEADCHNL RV SENSING INTR AMPL: 15.875 mV
MDC IDC MSMT LEADCHNL RV SENSING INTR AMPL: 17.75 mV
MDC IDC SESS DTM: 20150625124515
MDC IDC SET ZONE DETECTION INTERVAL: 250 ms
MDC IDC SET ZONE DETECTION INTERVAL: 300 ms
MDC IDC STAT BRADY AP VS PERCENT: 11.63 %
MDC IDC STAT BRADY AS VP PERCENT: 0 %
MDC IDC STAT BRADY RA PERCENT PACED: 11.8 %
MDC IDC STAT BRADY RV PERCENT PACED: 0.17 %
Zone Setting Detection Interval: 350 ms
Zone Setting Detection Interval: 450 ms

## 2014-06-08 MED ORDER — MEXILETINE HCL 200 MG PO CAPS: 200.0000 mg | ORAL_CAPSULE | Freq: Two times a day (BID) | ORAL | Status: DC

## 2014-06-08 NOTE — Patient Instructions (Signed)
Your physician recommends that you schedule a follow-up appointment in: 2 months with Dr C as scheduled and 4 months with Dr Caryl Comes  Your physician has recommended you make the following change in your medication:  1) Start Mexiletine 200mg  twice daily

## 2014-06-08 NOTE — Progress Notes (Signed)
Patient Care Team: No Pcp Per Patient as PCP - General (General Practice)   HPI  Gregory [name] is a 53 y.o. male Seen in followup for ventricular tachycardia associated with a previously implanted ICD in the context of congestive heart failure ischemic cardiomyopathy. Ejection fraction echo 2000 1445-50% and Myoview 2010 extensive inferolateral scar  Most recent catheterization was remote at that time showed total occlusion of his RCA and DES stenting to his circumflex and diagonals.  He feels well without chest pain or shortness of breath     Past Medical History  Diagnosis Date  . CAD (coronary artery disease) 12/01/2013  . Ischemic cardiomyopathy 12/01/2013  . Chronic combined systolic and diastolic CHF, NYHA class 1 12/01/2013  . Erectile dysfunction 12/01/2013  . HTN (hypertension) 12/01/2013  . Hyperlipidemia 12/01/2013    Past Surgical History  Procedure Laterality Date  . Cardiac defibrillator placement    . Wrist tenodesis    . Achilles tendon repair      lft foot    Current Outpatient Prescriptions  Medication Sig Dispense Refill  . amLODipine (NORVASC) 10 MG tablet Take 1 tablet (10 mg total) by mouth daily.  30 tablet  5  . aspirin 81 MG tablet Take 81 mg by mouth daily.      Marland Kitchen atorvastatin (LIPITOR) 80 MG tablet Take 1 tablet (80 mg total) by mouth daily.  90 tablet  3  . clopidogrel (PLAVIX) 75 MG tablet Take 1 tablet (75 mg total) by mouth daily with breakfast.  90 tablet  3  . COREG CR 80 MG 24 hr capsule TAKE 1 CAPSULE BY MOUTH EVERY DAY  30 capsule  5  . hydrALAZINE (APRESOLINE) 100 MG tablet Take 100 mg by mouth 2 (two) times daily.      . hydrochlorothiazide (HYDRODIURIL) 25 MG tablet TAKE 1 TABLET BY MOUTH DAILY  90 tablet  1  . potassium chloride (K-DUR,KLOR-CON) 10 MEQ tablet Take 1 tablet (10 mEq total) by mouth daily.  90 tablet  2  . ramipril (ALTACE) 10 MG capsule TAKE 2 CAPSULES BY MOUTH EVERY DAY  60 capsule  11  .  sildenafil (VIAGRA) 50 MG tablet Take 1 tablet (50 mg total) by mouth daily as needed.  30 tablet  5   No current facility-administered medications for this visit.    No Known Allergies  Review of Systems negative except from HPI and PMH  Physical Exam BP 138/76  Pulse 75  Ht 6\' 6"  (1.981 m)  Wt 300 lb (136.079 kg)  BMI 34.68 kg/m2 Well developed and well nourished in no acute distress HENT normal E scleral and icterus clear Neck Supple JVP flat; carotids brisk and full Clear to ausculation  Regular rate and rhythm, no murmurs gallops or rub Soft with active bowel sounds No clubbing cyanosis   Edema Alert and oriented, grossly normal motor and sensory function Skin Warm and Dry  ECG demonstrates sinus rhythm with PVCs and a pattern of bigeminy. Intervals 18/10/40 Prior inferior wall MI PVC morphology right bundle superior axis  Assessment and  Plan  PVCs  Ventricular tahcycardia recurrent treated VT w successful ATP  ICD  medtronic The patient's device was interrogated and the information was fully reviewed.  The device was reprogrammed to descending zone was more ATP for VThe 200-240 beats per minute  Ischemic Cardiomyopathy  CHF chronic systolic  He is doing quite well symptomatically. He continues however to have treated ventricular tachycardia.  Given  his ventricular ectopy burden, we will add an antiarrhythmic and will try mexiletine 200 mg twice daily. I would add ranolazine if he continues to have symptoms prior to the addition of amiodarone or consideration for catheter ablation.  He is to see Dr. Loletha Grayer. in 2 months. I will see him again in 4 months.

## 2014-06-15 ENCOUNTER — Ambulatory Visit (INDEPENDENT_AMBULATORY_CARE_PROVIDER_SITE_OTHER): Payer: Worker's Compensation | Admitting: *Deleted

## 2014-06-15 ENCOUNTER — Encounter: Payer: Self-pay | Admitting: Cardiovascular Disease

## 2014-06-15 ENCOUNTER — Telehealth: Payer: Self-pay | Admitting: Cardiovascular Disease

## 2014-06-15 DIAGNOSIS — I4729 Other ventricular tachycardia: Secondary | ICD-10-CM

## 2014-06-15 DIAGNOSIS — I472 Ventricular tachycardia: Secondary | ICD-10-CM

## 2014-06-15 LAB — MDC_IDC_ENUM_SESS_TYPE_INCLINIC

## 2014-06-15 NOTE — Telephone Encounter (Signed)
Patient thinks he got a shock last night that woke him from sleep.  He had something similar in April and feels it may have been the same things.  Reviewed with Tobin Chad and they will do a download on the patient's device at the [place] [facility] today at 4:30pm.  Patient voiced understanding.

## 2014-06-15 NOTE — Telephone Encounter (Signed)
Pt think he might got shocked during the night.

## 2014-06-15 NOTE — Progress Notes (Signed)
Interrogation only for questionable ICD discharge during the night.  No episodes noted.  Frequent PVC's, 695/hour.  Follow up as scheduled.

## 2014-06-19 NOTE — Telephone Encounter (Signed)
Closed encounter °

## 2014-07-10 ENCOUNTER — Encounter: Payer: Self-pay | Admitting: Cardiovascular Disease

## 2014-07-13 ENCOUNTER — Other Ambulatory Visit: Payer: Self-pay | Admitting: Cardiovascular Disease

## 2014-07-13 NOTE — Telephone Encounter (Signed)
Rx was sent to pharmacy electronically. 

## 2014-07-15 ENCOUNTER — Other Ambulatory Visit: Payer: Self-pay | Admitting: Cardiovascular Disease

## 2014-07-17 ENCOUNTER — Encounter: Payer: Self-pay | Admitting: Cardiovascular Disease

## 2014-07-20 ENCOUNTER — Other Ambulatory Visit: Payer: Self-pay | Admitting: Cardiovascular Disease

## 2014-07-21 NOTE — Telephone Encounter (Signed)
Rx was sent to pharmacy electronically. 

## 2014-08-08 ENCOUNTER — Encounter: Payer: Self-pay | Admitting: Cardiovascular Disease

## 2014-08-08 ENCOUNTER — Ambulatory Visit (INDEPENDENT_AMBULATORY_CARE_PROVIDER_SITE_OTHER): Payer: Worker's Compensation | Admitting: Cardiovascular Disease

## 2014-08-08 VITALS — BP 130/86 | HR 62 | Resp 20 | Ht 78.0 in | Wt 295.3 lb

## 2014-08-08 DIAGNOSIS — I2510 Atherosclerotic heart disease of native coronary artery without angina pectoris: Secondary | ICD-10-CM

## 2014-08-08 DIAGNOSIS — I2589 Other forms of chronic ischemic heart disease: Secondary | ICD-10-CM

## 2014-08-08 DIAGNOSIS — I472 Ventricular tachycardia, unspecified: Secondary | ICD-10-CM | POA: Diagnosis not present

## 2014-08-08 DIAGNOSIS — I4729 Other ventricular tachycardia: Secondary | ICD-10-CM | POA: Diagnosis not present

## 2014-08-08 DIAGNOSIS — Z95810 Presence of automatic (implantable) cardiac defibrillator: Secondary | ICD-10-CM

## 2014-08-08 DIAGNOSIS — I255 Ischemic cardiomyopathy: Secondary | ICD-10-CM

## 2014-08-08 DIAGNOSIS — I509 Heart failure, unspecified: Secondary | ICD-10-CM | POA: Diagnosis not present

## 2014-08-08 DIAGNOSIS — I5042 Chronic combined systolic (congestive) and diastolic (congestive) heart failure: Secondary | ICD-10-CM | POA: Diagnosis not present

## 2014-08-08 LAB — MDC_IDC_ENUM_SESS_TYPE_INCLINIC
Battery Voltage: 2.99 V
Brady Statistic AP VP Percent: 0.14 %
Brady Statistic AP VS Percent: 17.09 %
Brady Statistic AS VP Percent: 0 %
Brady Statistic RA Percent Paced: 17.23 %
Brady Statistic RV Percent Paced: 0.14 %
Date Time Interrogation Session: 20150825110348
HIGH POWER IMPEDANCE MEASURED VALUE: 48 Ohm
HighPow Impedance: 62 Ohm
Lead Channel Impedance Value: 494 Ohm
Lead Channel Pacing Threshold Amplitude: 1.25 V
Lead Channel Pacing Threshold Pulse Width: 1 ms
Lead Channel Sensing Intrinsic Amplitude: 16.875 mV
Lead Channel Sensing Intrinsic Amplitude: 17.75 mV
Lead Channel Sensing Intrinsic Amplitude: 4.625 mV
Lead Channel Setting Pacing Amplitude: 2.5 V
Lead Channel Setting Pacing Amplitude: 2.5 V
Lead Channel Setting Sensing Sensitivity: 0.6 mV
MDC IDC MSMT LEADCHNL RA IMPEDANCE VALUE: 589 Ohm
MDC IDC MSMT LEADCHNL RA SENSING INTR AMPL: 4.375 mV
MDC IDC MSMT LEADCHNL RV PACING THRESHOLD AMPLITUDE: 1 V
MDC IDC MSMT LEADCHNL RV PACING THRESHOLD PULSEWIDTH: 0.4 ms
MDC IDC SET LEADCHNL RV PACING PULSEWIDTH: 0.4 ms
MDC IDC SET ZONE DETECTION INTERVAL: 250 ms
MDC IDC STAT BRADY AS VS PERCENT: 82.77 %
Zone Setting Detection Interval: 300 ms
Zone Setting Detection Interval: 350 ms
Zone Setting Detection Interval: 450 ms

## 2014-08-08 NOTE — Patient Instructions (Addendum)
Remote monitoring is used to monitor your ICD from home. This monitoring reduces the number of office visits required to check your device to one time per year. It allows [place] to keep an eye on the functioning of your device to ensure it is working properly. You are scheduled for a device check from home on 11-13-2014. You may send your transmission at any time that day. If you have a wireless device, the transmission will be sent automatically. After your physician reviews your transmission, you will receive a postcard with your next transmission date.  Your physician recommends that you schedule a follow-up appointment in: 6 months with Dr.Croitoru   Continue with current medications

## 2014-08-09 ENCOUNTER — Encounter: Payer: Self-pay | Admitting: Cardiovascular Disease

## 2014-08-09 NOTE — Assessment & Plan Note (Signed)
Chronic total occlusion of right coronary artery. Remote history of drug eluting stents to the diagonal artery and circumflex artery, roughly 10 years ago. He has not had angina pectoris. Nuclear stress test earlier this year showed low risk findings.

## 2014-08-09 NOTE — Progress Notes (Signed)
Patient ID: Gregory [name], male   DOB: 23-Feb-1961, 53 y.o.   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 He saw Dr. Caryl Comes in consultation and has started treatment with mexiletine. He is already on high-dose beta blocker. He is tolerating the antiarrhythmic without the usual neurological or gastric complications but is passing a lot of flatus since starting this medication. Really does not have any other complaints. He continues to work out and Merchandiser, retail. Activity is 3 hours a day per his device.  Interrogation of his defibrillator shows no new events since his last device check 2 months ago. He had a "phantom shock" on July 1. His last episode of treated ventricular tachyarrhythmia happened in early May. Thoracic impedance is in the normal range. He has only 17% atrial pacing and virtually no ventricular pacing. He has frequent but extremely brief episodes of atrial tachyarrhythmia that are not symptomatic.   No Known Allergies  Current Outpatient Prescriptions  Medication Sig Dispense Refill  . amLODipine (NORVASC) 10 MG tablet TAKE 1 TABLET BY MOUTH EVERY DAY.  30 tablet  7  . aspirin 81 MG tablet Take 81 mg by mouth daily.      Marland Kitchen atorvastatin (LIPITOR) 80 MG tablet Take 1 tablet (80 mg total) by mouth daily.  90 tablet  3  . clopidogrel (PLAVIX) 75 MG tablet Take 1 tablet (75 mg total) by mouth daily with breakfast.  90 tablet  3  . COREG CR 80 MG 24 hr capsule TAKE 1 CAPSULE BY MOUTH EVERY DAY  30 capsule  5  . hydrALAZINE (APRESOLINE) 50 MG tablet Take 2 tablets (100 mg total) by mouth 2 (two) times daily.  360 tablet  1  . hydrochlorothiazide (HYDRODIURIL) 25  MG tablet TAKE 1 TABLET BY MOUTH DAILY  90 tablet  1  . mexiletine (MEXITIL) 200 MG capsule Take 1 capsule (200 mg total) by mouth 2 (two) times daily.  60 capsule  11  . potassium chloride (K-DUR,KLOR-CON) 10 MEQ tablet Take 1 tablet (10 mEq total) by mouth daily.  90 tablet  2  . ramipril (ALTACE) 10 MG capsule TAKE 2 CAPSULES BY MOUTH EVERY DAY  60 capsule  11  . VIAGRA 50 MG tablet TAKE 1 TABLET BY MOUTH EVERY DAY AS NEEDED  30 tablet  0   No current facility-administered medications for this visit.    Past Medical History  Diagnosis Date  . CAD (coronary artery disease) 12/01/2013  . Ischemic cardiomyopathy 12/01/2013  . Chronic combined systolic and diastolic CHF, NYHA class 1 12/01/2013  . Erectile dysfunction 12/01/2013  . HTN (hypertension) 12/01/2013  . Hyperlipidemia 12/01/2013    Past Surgical History  Procedure Laterality Date  . Cardiac defibrillator placement    . Wrist tenodesis    . Achilles tendon repair      lft foot    Family History  Problem Relation Age of Onset  . Heart disease Mother   . Brain cancer Father     History   Social History  . Marital Status: Single    Spouse Name: N/A    Number of Children: N/A  . Years of Education: N/A   Occupational History  . Not on file.   Social History  Main Topics  . Smoking status: Former Smoker -- 0.50 packs/day for 30 years    Types: Cigarettes    Quit date: 05/31/2013  . Smokeless tobacco: Not on file  . Alcohol Use: No  . Drug Use: Not on file  . Sexual Activity: Not on file   Other Topics Concern  . Not on file   Social History Narrative  . No narrative on file    Review of systems: Increased flatus since starting mexitil. The patient specifically denies any chest pain at rest or with exertion, dyspnea at rest or with exertion, orthopnea, paroxysmal nocturnal dyspnea, syncope, palpitations, focal neurological deficits, intermittent claudication, lower extremity edema, unexplained weight  gain, cough, hemoptysis or wheezing.  The patient also denies abdominal pain, nausea, vomiting, dysphagia, diarrhea, constipation, polyuria, polydipsia, dysuria, hematuria, frequency, urgency, abnormal bleeding or bruising, fever, chills, unexpected weight changes, mood swings, change in skin or hair texture, change in voice quality, auditory or visual problems, allergic reactions or rashes, new musculoskeletal complaints other than usual "aches and pains".   PHYSICAL EXAM BP 130/86  Pulse 62  Resp 20  Ht '6\' 6"'  (1.981 m)  Wt 295 lb 4.8 oz (133.947 kg)  BMI 34.13 kg/m2 General: Alert, oriented x3, no distress  Head: no evidence of trauma, PERRL, EOMI, no exophtalmos or lid lag, no myxedema, no xanthelasma; normal ears, nose and oropharynx  Neck: normal jugular venous pulsations and no hepatojugular reflux; brisk carotid pulses without delay and no carotid bruits  Chest: clear to auscultation, no signs of consolidation by percussion or palpation, normal fremitus, symmetrical and full respiratory excursions; the appearance of the subclavian pacemaker site  Cardiovascular: normal position and quality of the apical impulse, regular rhythm, normal first and second heart sounds, no murmurs, rubs or gallops  Abdomen: no tenderness or distention, no masses by palpation, no abnormal pulsatility or arterial bruits, normal bowel sounds, no hepatosplenomegaly  Extremities: no clubbing, cyanosis or edema; 2+ radial, ulnar and brachial pulses bilaterally; 2+ right femoral, posterior tibial and dorsalis pedis pulses; 2+ left femoral, posterior tibial and dorsalis pedis pulses; no subclavian or femoral bruits  Neurological: grossly nonfocal    Lipid Panel     Component Value Date/Time   CHOL 116 02/28/2014 0837   TRIG 54 02/28/2014 0837   HDL 25* 02/28/2014 0837   CHOLHDL 4.6 02/28/2014 0837   VLDL 11 02/28/2014 0837   LDLCALC 80 02/28/2014 0837    BMET    Component Value Date/Time   NA 144 02/28/2014  0837   K 4.0 02/28/2014 0837   CL 105 02/28/2014 0837   CO2 28 02/28/2014 0837   GLUCOSE 125* 02/28/2014 0837   BUN 12 02/28/2014 0837   CREATININE 1.03 02/28/2014 0837   CREATININE 0.99 11/01/2009 0520   CALCIUM 8.7 02/28/2014 0837   GFRNONAA >60 11/01/2009 0520   GFRAA  Value: >60        The eGFR has been calculated using the MDRD equation. This calculation has not been validated in all clinical situations. eGFR's persistently <60 mL/min signify possible Chronic Kidney Disease. 11/01/2009 0520     ASSESSMENT AND PLAN Sustained ventricular tachycardia Arrhythmia burden seems to now have diminished on current antiarrhythmic therapy. He remains physically very active. If arrhythmia recurs, Dr. Caryl Comes suggested adding ranolazine or switching to amiodarone therapy  ICD - dual chamber Medtronic programmed VVI No further sustained VT or defibrillator intervention in the last 3 months or so. Normal device function. No permanent programming changes made.  Ischemic cardiomyopathy Mild to moderate depressed left ventricular systolic function do to the inferior wall scar without clinical heart failure. He is on high-dose beta blocker therapy and full dose ACE inhibitor. He does not require loop diuretics. In YHA functional class I. Appears clinically euvolemic.  CAD (coronary artery disease) Chronic total occlusion of right coronary artery. Remote history of drug eluting stents to the diagonal artery and circumflex artery, roughly 10 years ago. He has not had angina pectoris. Nuclear stress test earlier this year showed low risk findings.  Orders Placed This Encounter  Procedures  . Implantable device check   No orders of the defined types were placed in this encounter.    Holli Humbles, MD, [place] [phone] office [phone] pager

## 2014-08-09 NOTE — Assessment & Plan Note (Signed)
Mild to moderate depressed left ventricular systolic function do to the inferior wall scar without clinical heart failure. He is on high-dose beta blocker therapy and full dose ACE inhibitor. He does not require loop diuretics. In YHA functional class I. Appears clinically euvolemic.

## 2014-08-09 NOTE — Assessment & Plan Note (Signed)
No further sustained VT or defibrillator intervention in the last 3 months or so. Normal device function. No permanent programming changes made.

## 2014-08-09 NOTE — Assessment & Plan Note (Signed)
Arrhythmia burden seems to now have diminished on current antiarrhythmic therapy. He remains physically very active. If arrhythmia recurs, Dr. Caryl Comes suggested adding ranolazine or switching to amiodarone therapy

## 2014-08-15 ENCOUNTER — Other Ambulatory Visit: Payer: Self-pay | Admitting: Cardiovascular Disease

## 2014-08-28 ENCOUNTER — Encounter: Payer: Self-pay | Admitting: Internal Medicine

## 2014-09-14 ENCOUNTER — Other Ambulatory Visit: Payer: Self-pay | Admitting: Cardiovascular Disease

## 2014-09-26 ENCOUNTER — Encounter: Payer: Self-pay | Admitting: Internal Medicine

## 2014-09-26 ENCOUNTER — Ambulatory Visit (INDEPENDENT_AMBULATORY_CARE_PROVIDER_SITE_OTHER): Payer: Worker's Compensation | Admitting: Internal Medicine

## 2014-09-26 VITALS — BP 138/80 | HR 67 | Ht 78.0 in | Wt 293.8 lb

## 2014-09-26 DIAGNOSIS — I472 Ventricular tachycardia, unspecified: Secondary | ICD-10-CM

## 2014-09-26 DIAGNOSIS — I5042 Chronic combined systolic (congestive) and diastolic (congestive) heart failure: Secondary | ICD-10-CM

## 2014-09-26 DIAGNOSIS — Z95810 Presence of automatic (implantable) cardiac defibrillator: Secondary | ICD-10-CM

## 2014-09-26 DIAGNOSIS — I4729 Other ventricular tachycardia: Secondary | ICD-10-CM

## 2014-09-26 DIAGNOSIS — I255 Ischemic cardiomyopathy: Secondary | ICD-10-CM

## 2014-09-26 LAB — MDC_IDC_ENUM_SESS_TYPE_INCLINIC
Battery Voltage: 2.98 V
Brady Statistic AP VP Percent: 0.03 %
Brady Statistic AP VS Percent: 14.47 %
Brady Statistic AS VS Percent: 85.5 %
Brady Statistic RV Percent Paced: 0.03 %
HighPow Impedance: 47 Ohm
HighPow Impedance: 60 Ohm
Lead Channel Impedance Value: 532 Ohm
Lead Channel Sensing Intrinsic Amplitude: 17.375 mV
Lead Channel Sensing Intrinsic Amplitude: 20.625 mV
Lead Channel Sensing Intrinsic Amplitude: 4.625 mV
Lead Channel Setting Pacing Amplitude: 2.5 V
Lead Channel Setting Pacing Amplitude: 2.5 V
Lead Channel Setting Pacing Pulse Width: 0.4 ms
Lead Channel Setting Sensing Sensitivity: 0.6 mV
MDC IDC MSMT LEADCHNL RA SENSING INTR AMPL: 4.625 mV
MDC IDC MSMT LEADCHNL RV IMPEDANCE VALUE: 532 Ohm
MDC IDC SESS DTM: 20151013195606
MDC IDC SET ZONE DETECTION INTERVAL: 350 ms
MDC IDC STAT BRADY AS VP PERCENT: 0 %
MDC IDC STAT BRADY RA PERCENT PACED: 14.5 %
Zone Setting Detection Interval: 250 ms
Zone Setting Detection Interval: 300 ms
Zone Setting Detection Interval: 450 ms

## 2014-09-26 MED ORDER — MEXILETINE HCL 150 MG PO CAPS: 300.0000 mg | ORAL_CAPSULE | Freq: Two times a day (BID) | ORAL | Status: DC

## 2014-09-26 NOTE — Patient Instructions (Signed)
Your physician has recommended you make the following change in your medication:  1) Increase Mexiletine to 300 mg twice a day.  Your physician wants you to follow-up in: 4 months with Dr. Caryl Comes. You will receive a reminder letter in the mail two months in advance. If you don't receive a letter, please call our office to schedule the follow-up appointment.  Your physician recommends that have labs today: BMET & MAGNESIUM

## 2014-09-26 NOTE — Progress Notes (Signed)
Patient Care Team: No Pcp Per Patient as PCP - General (General Practice)   HPI  Gregory [name] Card is a 53 y.o. male Seen in followup for ventricular tachycardia associated with a previously implanted ICD in the context of congestive heart failure ischemic cardiomyopathy. Ejection fraction echo 2000 1445-50% and Myoview 2010 extensive inferolateral scar  Most recent catheterization was remote at that time showed total occlusion of his RCA and DES stenting to his circumflex and diagonals.  He feels well without chest pain or shortness of breath   He is not having any problems with the mexiletine    Past Medical History  Diagnosis Date  . CAD (coronary artery disease) 12/01/2013  . Ischemic cardiomyopathy 12/01/2013  . Chronic combined systolic and diastolic CHF, NYHA class 1 12/01/2013  . Erectile dysfunction 12/01/2013  . HTN (hypertension) 12/01/2013  . Hyperlipidemia 12/01/2013    Past Surgical History  Procedure Laterality Date  . Cardiac defibrillator placement    . Wrist tenodesis    . Achilles tendon repair      lft foot    Current Outpatient Prescriptions  Medication Sig Dispense Refill  . amLODipine (NORVASC) 10 MG tablet TAKE 1 TABLET BY MOUTH EVERY DAY.  30 tablet  7  . aspirin 81 MG tablet Take 81 mg by mouth daily.      Marland Kitchen atorvastatin (LIPITOR) 80 MG tablet Take 1 tablet (80 mg total) by mouth daily.  90 tablet  3  . clopidogrel (PLAVIX) 75 MG tablet Take 1 tablet (75 mg total) by mouth daily with breakfast.  90 tablet  3  . COREG CR 80 MG 24 hr capsule TAKE 1 CAPSULE BY MOUTH EVERY DAY  30 capsule  5  . hydrALAZINE (APRESOLINE) 50 MG tablet Take 2 tablets (100 mg total) by mouth 2 (two) times daily.  360 tablet  1  . hydrochlorothiazide (HYDRODIURIL) 25 MG tablet TAKE 1 TABLET BY MOUTH DAILY  90 tablet  1  . mexiletine (MEXITIL) 200 MG capsule Take 1 capsule (200 mg total) by mouth 2 (two) times daily.  60 capsule  11  . potassium chloride  (K-DUR,KLOR-CON) 10 MEQ tablet Take 1 tablet (10 mEq total) by mouth daily.  90 tablet  2  . ramipril (ALTACE) 10 MG capsule TAKE 2 CAPSULES BY MOUTH EVERY DAY  60 capsule  11  . VIAGRA 50 MG tablet TAKE 1 TABLET BY MOUTH EVERY DAY AS NEEDED.  30 tablet  0   No current facility-administered medications for this visit.    No Known Allergies  Review of Systems negative except from HPI and PMH  Physical Exam There were no vitals taken for this visit. Well developed and well nourished in no acute distress HENT normal E scleral and icterus clear Neck Supple JVP flat; carotids brisk and full Clear to ausculation  Regular rate and rhythm, no murmurs gallops or rub Soft with active bowel sounds No clubbing cyanosis   Edema Alert and oriented, grossly normal motor and sensory function Skin Warm and Dry  ECG demonstrates sinus rhythm with PVCs but now only   20% compared to 50%. Intervals 18/10/40 Prior inferior wall MI PVC morphology right bundle superior axis  Assessment and  Plan  PVCs  Ventricular tahcycardia recurrent treated VT w successful ATP  ICD  medtronic The patient's device was interrogated and the information was fully reviewed.  The device was reprogrammed to  more ATP for VThe 200-240 beats per minute  Ischemic  Cardiomyopathy  CHF chronic systolic  He is doing quite well symptomatically. He continues however to have treated ventricular tachycardia on the only one intercurrent episode. His ventricular ectopy burden is diminished by about 25% now to about 5 beats per minute. This represents approximately 8 or 9%. We will increase his mexiletine to a total of 600 mg a day and reassess in a few months. Ranolazine would be reasonable as the next step. There appeared to be 2 distinct morphologies..   I will see him again in about 4 months time.

## 2014-09-27 LAB — BASIC METABOLIC PANEL
BUN: 11 mg/dL (ref 6–23)
CHLORIDE: 104 meq/L (ref 96–112)
CO2: 30 mEq/L (ref 19–32)
CREATININE: 1.3 mg/dL (ref 0.4–1.5)
Calcium: 9.2 mg/dL (ref 8.4–10.5)
GFR: 74.17 mL/min (ref >60.00)
Glucose, Bld: 86 mg/dL (ref 70–99)
POTASSIUM: 3.7 meq/L (ref 3.5–5.1)
SODIUM: 139 meq/L (ref 135–145)

## 2014-09-27 LAB — MAGNESIUM: MAGNESIUM: 2 mg/dL (ref 1.5–2.5)

## 2014-10-03 ENCOUNTER — Telehealth: Payer: Self-pay | Admitting: *Deleted

## 2014-10-03 NOTE — Telephone Encounter (Signed)
Patient would like to discuss the mexiletine. He stated that he understood Dr Caryl Comes say the he would change the dose to 150 mg bid, but that is not what is reflected on the chart. Also if this is correct, he stated that he needs a prior authorization in order for his insurance to pay. Please advise. Thanks, MI

## 2014-10-04 NOTE — Telephone Encounter (Signed)
Called patient to inform him that I called for prior auth. They did not need PA. Called to confirm rx had been filled. Informed patient his rx is ready for pick up at the pharmacy. Pt verbalized understanding.

## 2014-10-04 NOTE — Telephone Encounter (Signed)
Informed patient of medication increase per Dr. Caryl Comes at last [place]. Pt verbalizes understanding. Informed him that I would complete prior auth today.

## 2014-10-12 ENCOUNTER — Other Ambulatory Visit: Payer: Self-pay | Admitting: Cardiovascular Disease

## 2014-10-12 NOTE — Telephone Encounter (Signed)
Rx was sent to pharmacy electronically. 

## 2014-10-18 ENCOUNTER — Other Ambulatory Visit: Payer: Self-pay | Admitting: Cardiovascular Disease

## 2014-10-18 NOTE — Telephone Encounter (Signed)
Rx was sent to pharmacy electronically. 

## 2014-10-19 ENCOUNTER — Other Ambulatory Visit: Payer: Self-pay | Admitting: Cardiovascular Disease

## 2014-10-20 NOTE — Telephone Encounter (Signed)
Pt still have not received his Viagra. Please call this in today to [facility]--[phone].

## 2014-10-20 NOTE — Telephone Encounter (Signed)
Spoke with patient and let him know that Dr.C has to approve the Rx and it has been sent to him. Patient voiced understanding and stated he had other Rx ready for pick up and pharmacy and was just trying to go in one trip. Michela Pitcher he will pick it up whenever it is ready.

## 2014-11-01 ENCOUNTER — Telehealth: Payer: Self-pay | Admitting: Cardiovascular Disease

## 2014-11-01 ENCOUNTER — Telehealth: Payer: Self-pay | Admitting: *Deleted

## 2014-11-01 NOTE — Telephone Encounter (Signed)
Received notification from [facility] about his Viagra stating it will not be covered next year.  Patient called his insurance company and [facility] is NOT affiliated with them and will cover Viagra in the new year.  Told patient we will disregard the notice from [place].

## 2014-11-01 NOTE — Telephone Encounter (Signed)
Pt states that he was speaking with Pamala Hurry earlier about a Viagra prescripton and his insurance company. Please call back  Thanks

## 2014-11-01 NOTE — Telephone Encounter (Signed)
Patient notified [facility] sent notice his Viagra will not be covered in the near future but Cialis will be on his formulary.  Patient states he also received this info but done not use [facility] so he threw it away.  States he will call his insurance company to clarify.  Asked that he call and let us know what he wants [place] to do.  This will probably take affect January 2016.  Patient assured me he will.

## 2014-11-21 ENCOUNTER — Other Ambulatory Visit: Payer: Self-pay | Admitting: Internal Medicine

## 2014-11-21 ENCOUNTER — Other Ambulatory Visit: Payer: Self-pay | Admitting: Cardiovascular Disease

## 2014-11-21 NOTE — Telephone Encounter (Signed)
Rx was sent to pharmacy electronically. 

## 2014-11-29 ENCOUNTER — Telehealth: Payer: Self-pay | Admitting: *Deleted

## 2014-11-29 NOTE — Telephone Encounter (Signed)
Patient notified in November that the Viagra 50mg  may not be covered by his insurance.  If not Dr. Loletha Grayer will change to Cialis 10mg  prn.  Patient voiced understanding and will let us know if it needs to be switched.

## 2014-12-19 ENCOUNTER — Telehealth: Payer: Self-pay | Admitting: Cardiovascular Disease

## 2014-12-19 ENCOUNTER — Other Ambulatory Visit: Payer: Self-pay | Admitting: Cardiovascular Disease

## 2014-12-19 NOTE — Telephone Encounter (Signed)
Pt was returning Barbara's call in reference to his viagra prescription. Please call  Thanks

## 2014-12-19 NOTE — Telephone Encounter (Deleted)
Rx(s) sent to pharmacy electronically.  

## 2014-12-20 NOTE — Telephone Encounter (Signed)
Received a call back from patient he stated he already spoke to [facility] yesterday and everything taken care of.

## 2014-12-20 NOTE — Telephone Encounter (Signed)
Returned call to patient no answer.LMTC. 

## 2014-12-22 ENCOUNTER — Telehealth: Payer: Self-pay | Admitting: *Deleted

## 2014-12-22 NOTE — Telephone Encounter (Signed)
Pt says he still have not received his Viagra.Please call it in today to [facility].

## 2014-12-22 NOTE — Telephone Encounter (Signed)
Called to patient to let him know a refill has been placed on the Viagra 50mg  #30 w/no refills.  Per Dr. Loletha Grayer he will discuss how he is taking this when he comes in for his appt in March.  Patient voiced understanding.

## 2015-01-01 ENCOUNTER — Telehealth: Payer: Self-pay | Admitting: Cardiovascular Disease

## 2015-01-01 ENCOUNTER — Ambulatory Visit (INDEPENDENT_AMBULATORY_CARE_PROVIDER_SITE_OTHER): Payer: Worker's Compensation | Admitting: *Deleted

## 2015-01-01 DIAGNOSIS — I5042 Chronic combined systolic (congestive) and diastolic (congestive) heart failure: Secondary | ICD-10-CM

## 2015-01-01 DIAGNOSIS — I255 Ischemic cardiomyopathy: Secondary | ICD-10-CM | POA: Diagnosis not present

## 2015-01-01 LAB — MDC_IDC_ENUM_SESS_TYPE_INCLINIC
Brady Statistic AP VP Percent: 0.02 %
Brady Statistic AP VS Percent: 9.09 %
Brady Statistic AS VP Percent: 0 %
Brady Statistic AS VS Percent: 90.9 %
Brady Statistic RA Percent Paced: 9.1 %
Date Time Interrogation Session: 20160118164609
HIGH POWER IMPEDANCE MEASURED VALUE: 47 Ohm
HighPow Impedance: 67 Ohm
Lead Channel Impedance Value: 532 Ohm
Lead Channel Pacing Threshold Amplitude: 0.75 V
Lead Channel Pacing Threshold Amplitude: 1 V
Lead Channel Pacing Threshold Pulse Width: 1 ms
Lead Channel Sensing Intrinsic Amplitude: 18.625 mV
Lead Channel Setting Pacing Amplitude: 2.5 V
Lead Channel Setting Pacing Pulse Width: 0.4 ms
MDC IDC MSMT BATTERY VOLTAGE: 2.94 V
MDC IDC MSMT LEADCHNL RA SENSING INTR AMPL: 4.875 mV
MDC IDC MSMT LEADCHNL RA SENSING INTR AMPL: 5 mV
MDC IDC MSMT LEADCHNL RV IMPEDANCE VALUE: 532 Ohm
MDC IDC MSMT LEADCHNL RV PACING THRESHOLD PULSEWIDTH: 0.4 ms
MDC IDC MSMT LEADCHNL RV SENSING INTR AMPL: 17.5 mV
MDC IDC SET LEADCHNL RA PACING AMPLITUDE: 2.5 V
MDC IDC SET LEADCHNL RV SENSING SENSITIVITY: 0.6 mV
MDC IDC SET ZONE DETECTION INTERVAL: 300 ms
MDC IDC STAT BRADY RV PERCENT PACED: 0.02 %
Zone Setting Detection Interval: 250 ms
Zone Setting Detection Interval: 350 ms
Zone Setting Detection Interval: 450 ms

## 2015-01-01 NOTE — Telephone Encounter (Signed)
Pt called in stating that he is a Defibrillator and that he has been having some vibrations in his chest that have become more frequent and he would like to be advised on what to do. Please call  Thanks

## 2015-01-01 NOTE — Progress Notes (Signed)
ICD check in clinic.  The patient was seen for c/o right chest vibration although the device is a left chest implant.   Normal device function. Thresholds and sensing consistent with previous device measurements. Impedance trends stable over time.  Optivol and thoracic impedance abnormal 12/30 ongoing.   No evidence of any ventricular arrhythmias. No mode switches. Histogram distribution appropriate for patient and level of activity. No changes made this session. Device programmed at appropriate safety margins. Device programmed to optimize intrinsic conduction. Patient education completed including shock plan. Alert tones/vibration demonstrated for patient.  Follow up with Dr. Sallyanne Kuster in March.

## 2015-01-01 NOTE — Telephone Encounter (Signed)
Pt informed to call paula or shakila at [phone]

## 2015-01-11 ENCOUNTER — Other Ambulatory Visit: Payer: Self-pay | Admitting: Cardiovascular Disease

## 2015-01-11 NOTE — Telephone Encounter (Signed)
Rx has been sent to the pharmacy electronically. ° °

## 2015-01-16 ENCOUNTER — Encounter: Payer: Self-pay | Admitting: Cardiovascular Disease

## 2015-02-13 ENCOUNTER — Encounter: Payer: Self-pay | Admitting: Cardiovascular Disease

## 2015-02-13 ENCOUNTER — Ambulatory Visit (INDEPENDENT_AMBULATORY_CARE_PROVIDER_SITE_OTHER): Payer: Worker's Compensation | Admitting: Cardiovascular Disease

## 2015-02-13 VITALS — BP 147/91 | HR 78 | Resp 16 | Ht 78.0 in | Wt 301.5 lb

## 2015-02-13 DIAGNOSIS — I2510 Atherosclerotic heart disease of native coronary artery without angina pectoris: Secondary | ICD-10-CM

## 2015-02-13 DIAGNOSIS — Z95810 Presence of automatic (implantable) cardiac defibrillator: Secondary | ICD-10-CM

## 2015-02-13 DIAGNOSIS — I255 Ischemic cardiomyopathy: Secondary | ICD-10-CM

## 2015-02-13 DIAGNOSIS — Z79899 Other long term (current) drug therapy: Secondary | ICD-10-CM

## 2015-02-13 DIAGNOSIS — E785 Hyperlipidemia, unspecified: Secondary | ICD-10-CM

## 2015-02-13 DIAGNOSIS — G4733 Obstructive sleep apnea (adult) (pediatric): Secondary | ICD-10-CM

## 2015-02-13 DIAGNOSIS — I5042 Chronic combined systolic (congestive) and diastolic (congestive) heart failure: Secondary | ICD-10-CM

## 2015-02-13 DIAGNOSIS — Z9989 Dependence on other enabling machines and devices: Secondary | ICD-10-CM

## 2015-02-13 DIAGNOSIS — I10 Essential (primary) hypertension: Secondary | ICD-10-CM

## 2015-02-13 NOTE — Patient Instructions (Signed)
Your physician recommends that you return for lab work in: FASTING at [facility] lab.  Remote monitoring is used to monitor your Pacemaker or ICD from home. This monitoring reduces the number of office visits required to check your device to one time per year. It allows [place] to monitor the functioning of your device to ensure it is working properly. You are scheduled for a device check from home on May 17, 2015. You may send your transmission at any time that day. If you have a wireless device, the transmission will be sent automatically. After your physician reviews your transmission, you will receive a postcard with your next transmission date.  Dr. Sallyanne Kuster recommends that you schedule a follow-up appointment in: 6 months with Defib check [facility]).

## 2015-02-18 ENCOUNTER — Other Ambulatory Visit: Payer: Self-pay | Admitting: Cardiovascular Disease

## 2015-02-19 ENCOUNTER — Encounter: Payer: Self-pay | Admitting: Cardiovascular Disease

## 2015-02-19 NOTE — Progress Notes (Signed)
Patient ID: Gregory [name], male   DOB: April 07, 1961, 54 y.o.   MRN: [id number]     Cardiology Office Note   Date:  02/19/2015   ID:  Gregory [name], DOB [birth year], MRN [id number]  PCP:  No PCP Per Patient  Cardiologist:   Sanda Klein, Jeff;   Chief Complaint  Patient presents with  . Follow-up    No complaints of chest pain,SOB, edema or dizziness.      History of Present Illness: Gregory [name] is a 54 y.o. male who presents for mild ischemic cardiomyopathy and well compensated heart failure who has received appropriate defibrillator therapy for sustained monomorphic ventricular tachycardia in early 2015. He saw Dr. Caryl Comes in consultation and started treatment with mexiletine, increased to 300 mg BID last October due to the frequency of ventricular ectopy. He is already on high-dose beta blocker. He is tolerating the antiarrhythmic without the usual neurological or gastric complications but is passing a lot of flatus since starting this medication. Really does not have any other complaints. He continues to work out and Merchandiser, retail.   Interrogation of his defibrillator shows no new events since his last device check 4 months ago. His last episode of treated ventricular tachyarrhythmia happened in September. Thoracic impedance shows fluid accumulation around the Christmas holidays, now stable (and asymptomatic). Activity unchanged at around 2.5 h/day. He has only 5% atrial pacing and virtually no ventricular pacing. He still has frequent but extremely brief episodes of atrial tachyarrhythmia that are not symptomatic. No AFib is seen.   Past Medical History  Diagnosis Date  . CAD (coronary artery disease) 12/01/2013  . Ischemic cardiomyopathy 12/01/2013  . Chronic combined systolic and diastolic CHF, NYHA class 1 12/01/2013  . Erectile dysfunction 12/01/2013  . HTN (hypertension) 12/01/2013  . Hyperlipidemia 12/01/2013    Past Surgical History    Procedure Laterality Date  . Cardiac defibrillator placement    . Wrist tenodesis    . Achilles tendon repair      lft foot     Current Outpatient Prescriptions  Medication Sig Dispense Refill  . amLODipine (NORVASC) 10 MG tablet TAKE 1 TABLET BY MOUTH EVERY DAY. 30 tablet 7  . aspirin 81 MG tablet Take 81 mg by mouth daily.    Marland Kitchen atorvastatin (LIPITOR) 80 MG tablet TAKE 1 TABLET BY MOUTH EVERY DAY. 90 tablet 2  . clopidogrel (PLAVIX) 75 MG tablet Take 1 tablet (75 mg total) by mouth daily with breakfast. 90 tablet 3  . COREG CR 80 MG 24 hr capsule TAKE 1 CAPSULE BY MOUTH EVERY DAY 30 capsule 5  . hydrALAZINE (APRESOLINE) 50 MG tablet TAKE 2 TABLETS BY MOUTH TWICE DAILY. 360 tablet 1  . hydrochlorothiazide (HYDRODIURIL) 25 MG tablet TAKE 1 TABLET BY MOUTH DAILY 90 tablet 2  . mexiletine (MEXITIL) 150 MG capsule Take 2 capsules (300 mg total) by mouth 2 (two) times daily. 120 capsule 11  . potassium chloride (K-DUR,KLOR-CON) 10 MEQ tablet TAKE 1 TABLET BY MOUTH EVERY DAY 90 tablet 2  . ramipril (ALTACE) 10 MG capsule TAKE 2 CAPSULES BY MOUTH EVERY DAY. 60 capsule 7  . VIAGRA 50 MG tablet TAKE 1 TABLET BY MOUTH EVERY DAY AS NEEDED 30 tablet 1   No current facility-administered medications for this visit.    Allergies:   Review of patient's allergies indicates no known allergies.    Social History:  The patient  reports that he quit smoking about 20 months ago. His smoking  use included Cigarettes. He has a 15 pack-year smoking history. He does not have any smokeless tobacco history on file. He reports that he does not drink alcohol.   Family History:  The patient's family history includes Brain cancer in his father; Heart disease in his mother.    ROS:  Please see the history of present illness.   Increased flatus since starting mexitil. The patient specifically denies any chest pain at rest or with exertion, dyspnea at rest or with exertion, orthopnea, paroxysmal nocturnal dyspnea,  syncope, palpitations, focal neurological deficits, intermittent claudication, lower extremity edema, unexplained weight gain, cough, hemoptysis or wheezing.  The patient also denies abdominal pain, nausea, vomiting, dysphagia, diarrhea, constipation, polyuria, polydipsia, dysuria, hematuria, frequency, urgency, abnormal bleeding or bruising, fever, chills, unexpected weight changes, mood swings, change in skin or hair texture, change in voice quality, auditory or visual problems, allergic reactions or rashes, new musculoskeletal complaints other than usual "aches and pains".Otherwise, review of systems are positive for none.   All other systems are reviewed and negative.    PHYSICAL EXAM: VS:  BP 147/91 mmHg  Pulse 78  Resp 16  Ht 6\' 6"  (1.981 m)  Wt 301 lb 8 oz (136.76 kg)  BMI 34.85 kg/m2 , BMI Body mass index is 34.85 kg/(m^2). General: Alert, oriented x3, no distress  Head: no evidence of trauma, PERRL, EOMI, no exophtalmos or lid lag, no myxedema, no xanthelasma; normal ears, nose and oropharynx  Neck: normal jugular venous pulsations and no hepatojugular reflux; brisk carotid pulses without delay and no carotid bruits  Chest: clear to auscultation, no signs of consolidation by percussion or palpation, normal fremitus, symmetrical and full respiratory excursions; the appearance of the subclavian pacemaker site  Cardiovascular: normal position and quality of the apical impulse, regular rhythm, normal first and second heart sounds, no murmurs, rubs or gallops  Abdomen: no tenderness or distention, no masses by palpation, no abnormal pulsatility or arterial bruits, normal bowel sounds, no hepatosplenomegaly  Extremities: no clubbing, cyanosis or edema; 2+ radial, ulnar and brachial pulses bilaterally; 2+ right femoral, posterior tibial and dorsalis pedis pulses; 2+ left femoral, posterior tibial and dorsalis pedis pulses; no subclavian or femoral bruits  Neurological: grossly  nonfocal  EKG:  EKG is ordered today. The ekg ordered today demonstrates NSR, Q waves (old) in I, aVL, V6 and II,III, aVF.   Recent Labs: 02/28/2014: ALT 32 09/26/2014: BUN 11; Creatinine 1.3; Magnesium 2.0; Potassium 3.7; Sodium 139    Lipid Panel    Component Value Date/Time   CHOL 116 02/28/2014 0837   TRIG 54 02/28/2014 0837   HDL 25* 02/28/2014 0837   CHOLHDL 4.6 02/28/2014 0837   VLDL 11 02/28/2014 0837   LDLCALC 80 02/28/2014 0837      Wt Readings from Last 3 Encounters:  02/13/15 301 lb 8 oz (136.76 kg)  09/26/14 293 lb 12.8 oz (133.267 kg)  08/08/14 295 lb 4.8 oz (133.947 kg)      ASSESSMENT AND PLAN:  Sustained ventricular tachycardia No events since last increaser in mexiletine. Arrhythmia burden seems to now have diminished on current antiarrhythmic therapy. He remains physically very active. If arrhythmia recurs, Dr. Caryl Comes suggested adding ranolazine or switching to amiodarone therapy  ICD - dual chamber Medtronic programmed VVI No further sustained VT or defibrillator intervention in the last 4 months or so. Normal device function. No permanent programming changes made.  Ischemic cardiomyopathy Mild to moderate depressed left ventricular systolic function do to the inferior wall scar without  clinical heart failure. He is on high-dose beta blocker therapy and full dose ACE inhibitor. He does not require loop diuretics. INYHA functional class I. Appears clinically euvolemic.  CAD (coronary artery disease) Chronic total occlusion of right coronary artery. Remote history of drug eluting stents to the diagonal artery and circumflex artery, roughly 10 years ago. He has not had angina pectoris. Nuclear stress test 2015 showed low risk findings. LDL-C close to target. Low HDL would improve with weight loss.   Current medicines are reviewed at length with the patient today.  The patient does not have concerns regarding medicines.  The following changes have been  made:  no change  Labs/ tests ordered today include:  Orders Placed This Encounter  Procedures  . CBC  . Comprehensive metabolic panel  . Lipid panel   Patient Instructions  Your physician recommends that you return for lab work in: FASTING at [facility] lab.  Remote monitoring is used to monitor your Pacemaker or ICD from home. This monitoring reduces the number of office visits required to check your device to one time per year. It allows [place] to monitor the functioning of your device to ensure it is working properly. You are scheduled for a device check from home on May 17, 2015. You may send your transmission at any time that day. If you have a wireless device, the transmission will be sent automatically. After your physician reviews your transmission, you will receive a postcard with your next transmission date.  Dr. Sallyanne Kuster recommends that you schedule a follow-up appointment in: 6 months with Defib check [facility]).        Mikael Spray, MD  02/19/2015 7:21 PM    [address], [place], [place]  [id number] Phone: [phone]; Fax: [phone]

## 2015-02-20 LAB — MDC_IDC_ENUM_SESS_TYPE_REMOTE
Brady Statistic AP VP Percent: 0.05 %
Brady Statistic AP VS Percent: 4.59 %
Brady Statistic AS VS Percent: 95.36 %
Brady Statistic RA Percent Paced: 4.64 %
Date Time Interrogation Session: 20160301150616
HighPow Impedance: 49 Ohm
HighPow Impedance: 63 Ohm
Lead Channel Impedance Value: 532 Ohm
Lead Channel Impedance Value: 551 Ohm
Lead Channel Sensing Intrinsic Amplitude: 4.375 mV
Lead Channel Setting Pacing Pulse Width: 0.4 ms
Lead Channel Setting Sensing Sensitivity: 0.6 mV
MDC IDC MSMT BATTERY VOLTAGE: 2.93 V
MDC IDC MSMT LEADCHNL RA SENSING INTR AMPL: 4.375 mV
MDC IDC MSMT LEADCHNL RV SENSING INTR AMPL: 19.5 mV
MDC IDC MSMT LEADCHNL RV SENSING INTR AMPL: 19.5 mV
MDC IDC SET LEADCHNL RA PACING AMPLITUDE: 2.5 V
MDC IDC SET LEADCHNL RV PACING AMPLITUDE: 2.5 V
MDC IDC STAT BRADY AS VP PERCENT: 0 %
MDC IDC STAT BRADY RV PERCENT PACED: 0.05 %
Zone Setting Detection Interval: 250 ms
Zone Setting Detection Interval: 300 ms
Zone Setting Detection Interval: 350 ms
Zone Setting Detection Interval: 450 ms

## 2015-02-20 NOTE — Addendum Note (Signed)
Addended byChauncy Lean. on: 02/20/2015 04:00 PM   Modules accepted: Orders

## 2015-02-26 ENCOUNTER — Encounter: Payer: Self-pay | Admitting: Internal Medicine

## 2015-02-26 ENCOUNTER — Ambulatory Visit (INDEPENDENT_AMBULATORY_CARE_PROVIDER_SITE_OTHER): Payer: Worker's Compensation | Admitting: Internal Medicine

## 2015-02-26 VITALS — BP 124/80 | HR 77 | Ht 78.0 in | Wt 304.2 lb

## 2015-02-26 DIAGNOSIS — I472 Ventricular tachycardia, unspecified: Secondary | ICD-10-CM

## 2015-02-26 DIAGNOSIS — Z4502 Encounter for adjustment and management of automatic implantable cardiac defibrillator: Secondary | ICD-10-CM

## 2015-02-26 NOTE — Progress Notes (Signed)
Patient Care Team: No Pcp Per Patient as PCP - General (General Practice)   HPI  Gregory [name] is a 54 y.o. male Seen in followup for ventricular tachycardia associated with a previously implanted ICD in the context of congestive heart failure ischemic cardiomyopathy. Ejection fraction echo 2000 1445-50% and Myoview 2010 extensive inferolateral scar  Most recent catheterization was remote at that time showed total occlusion of his RCA and DES stenting to his circumflex and diagonals.  He feels well without chest pain or shortness of breath   He is not having any problems with the mexiletine except partly from some flatus    Past Medical History  Diagnosis Date  . CAD (coronary artery disease) 12/01/2013  . Ischemic cardiomyopathy 12/01/2013  . Chronic combined systolic and diastolic CHF, NYHA class 1 12/01/2013  . Erectile dysfunction 12/01/2013  . HTN (hypertension) 12/01/2013  . Hyperlipidemia 12/01/2013    Past Surgical History  Procedure Laterality Date  . Cardiac defibrillator placement    . Wrist tenodesis    . Achilles tendon repair      lft foot    Current Outpatient Prescriptions  Medication Sig Dispense Refill  . amLODipine (NORVASC) 10 MG tablet TAKE 1 TABLET BY MOUTH EVERY DAY. 30 tablet 7  . aspirin 81 MG tablet Take 81 mg by mouth daily.    Marland Kitchen atorvastatin (LIPITOR) 80 MG tablet TAKE 1 TABLET BY MOUTH EVERY DAY. 90 tablet 2  . clopidogrel (PLAVIX) 75 MG tablet Take 1 tablet (75 mg total) by mouth daily with breakfast. 90 tablet 3  . COREG CR 80 MG 24 hr capsule TAKE 1 CAPSULE BY MOUTH EVERY DAY 30 capsule 5  . hydrALAZINE (APRESOLINE) 50 MG tablet TAKE 2 TABLETS BY MOUTH TWICE DAILY. 360 tablet 1  . hydrochlorothiazide (HYDRODIURIL) 25 MG tablet TAKE 1 TABLET BY MOUTH DAILY 90 tablet 2  . mexiletine (MEXITIL) 150 MG capsule Take 2 capsules (300 mg total) by mouth 2 (two) times daily. 120 capsule 11  . potassium chloride (K-DUR,KLOR-CON)  10 MEQ tablet TAKE 1 TABLET BY MOUTH EVERY DAY 90 tablet 2  . ramipril (ALTACE) 10 MG capsule TAKE 2 CAPSULES BY MOUTH EVERY DAY. 60 capsule 7  . VIAGRA 50 MG tablet TAKE 1 TABLET BY MOUTH EVERY DAY AS NEEDED 30 tablet 1   No current facility-administered medications for this visit.    No Known Allergies  Review of Systems negative except from HPI and PMH  Physical Exam BP 124/80 mmHg  Pulse 77  Ht 6\' 6"  (1.981 m)  Wt 304 lb 3.2 oz (137.984 kg)  BMI 35.16 kg/m2 Well developed and well nourished in no acute distress HENT normal E scleral and icterus clear Neck Supple JVP flat; carotids brisk and full Clear to ausculation  Regular rate and rhythm, no murmurs gallops or rub Soft with active bowel sounds No clubbing cyanosis   Edema Alert and oriented, grossly normal motor and sensory function Skin Warm and Dry  ECG from 3/8 was reviewed. Notably showed no ventricular ectopy   Assessment and  Plan  PVCs few and far between   Ventricular tahcycardia recurrent treated VT w successful ATP  ICD  medtronic The patient's device was interrogated and the information was fully reviewed.  The device was reprogrammed to  more ATP for VThe 200-240 beats per minute  Ischemic Cardiomyopathy Without symptoms of ischemia  CHF chronic systolic  Euvolemic continue current meds   He has had no recurrent  ventricular tachycardia is detected by his device on the mexiletine. We will continue high-dose beta blockers.  We'll continue current medications

## 2015-02-26 NOTE — Patient Instructions (Signed)
Keep your remote check from home on 05/15/15.  Keep follow up in August with Dr. Sallyanne Kuster.  Your physician wants you to follow-up in: 1 year with Dr. Caryl Comes.  You will receive a reminder letter in the mail two months in advance. If you don't receive a letter, please call our office to schedule the follow-up appointment.

## 2015-03-01 ENCOUNTER — Encounter: Payer: Self-pay | Admitting: Cardiovascular Disease

## 2015-03-07 ENCOUNTER — Encounter: Payer: Self-pay | Admitting: Internal Medicine

## 2015-03-19 ENCOUNTER — Other Ambulatory Visit: Payer: Self-pay | Admitting: Cardiovascular Disease

## 2015-03-19 ENCOUNTER — Other Ambulatory Visit: Payer: Self-pay | Admitting: Physician Assistant

## 2015-03-19 NOTE — Telephone Encounter (Signed)
Rx(s) sent to pharmacy electronically.  

## 2015-04-23 ENCOUNTER — Other Ambulatory Visit: Payer: Self-pay | Admitting: Cardiovascular Disease

## 2015-05-15 ENCOUNTER — Encounter: Payer: Self-pay | Admitting: *Deleted

## 2015-05-15 ENCOUNTER — Telehealth: Payer: Self-pay | Admitting: Cardiology

## 2015-05-15 NOTE — Telephone Encounter (Signed)
Spoke with pt and reminded pt of remote transmission that is due today. Pt verbalized understanding.   

## 2015-05-18 ENCOUNTER — Encounter: Payer: Self-pay | Admitting: Cardiology

## 2015-05-29 ENCOUNTER — Telehealth: Payer: Self-pay | Admitting: Internal Medicine

## 2015-05-29 NOTE — Telephone Encounter (Signed)
Spoke to representative w/ [employer]. Power cord ordered. Patient aware and plans to send transmission once it is received.

## 2015-05-29 NOTE — Telephone Encounter (Signed)
°  1. Has your device fired?   2. Is you device beeping?  3. Are you experiencing draining or swelling at device site?  4. Are you calling to see if we received your device transmission?   5. Have you passed out?   Comments:   Pt was supposed to send a remote signal on 05/31 however he states that Tobin Chad was supposed to send a power cord/ charger in the mail so that he would be able to send the signal. Please assist

## 2015-06-05 ENCOUNTER — Encounter: Payer: Self-pay | Admitting: *Deleted

## 2015-06-05 ENCOUNTER — Telehealth: Payer: Self-pay | Admitting: Cardiology

## 2015-06-05 NOTE — Telephone Encounter (Signed)
LMOVM reminding pt to send remote transmission.   

## 2015-06-06 ENCOUNTER — Telehealth: Payer: Self-pay | Admitting: Internal Medicine

## 2015-06-06 NOTE — Telephone Encounter (Signed)
New problem   Pt stated he received a call because he didn't do his device ck. Pt also stated he haven't received all of his equipment yet to do his device check. Please call pt.

## 2015-06-06 NOTE — Telephone Encounter (Signed)
Spoke w/ pt and he stated that he still has not received the power cord that device tech told him she would order. Informed him that I would speak w/ device tech and get back to him w/ and update. Pt verbalized understanding and will send transmission once power cord received.

## 2015-06-11 NOTE — Telephone Encounter (Signed)
Informed pt his power cord has been ordered. He stated that he would send transmission once it is received.

## 2015-06-26 ENCOUNTER — Telehealth: Payer: Self-pay | Admitting: Internal Medicine

## 2015-06-26 NOTE — Telephone Encounter (Signed)
New Message       Pt calling stating that he still hasn't received the power cord for his monitor. Please call back and advise.

## 2015-06-29 NOTE — Telephone Encounter (Signed)
New [employer] monitor/return kit ordered for pt. Patient aware and voiced understanding.

## 2015-07-10 ENCOUNTER — Telehealth: Payer: Self-pay | Admitting: Cardiovascular Disease

## 2015-07-10 NOTE — Telephone Encounter (Signed)
Informed patient that remote was not received. 800# given.

## 2015-07-10 NOTE — Telephone Encounter (Signed)
°  1. Has your device fired? No ° °2. Is you device beeping? No ° °3. Are you experiencing draining or swelling at device site? No ° °4. Are you calling to see if we received your device transmission? Yes ° °5. Have you passed out? No ° °

## 2015-07-11 ENCOUNTER — Other Ambulatory Visit: Payer: Self-pay | Admitting: Cardiovascular Disease

## 2015-07-12 NOTE — Telephone Encounter (Signed)
Updated pt we did receive his remote. No alerts. No new episodes. All other diagnostics normal. Pt thankful.

## 2015-07-15 ENCOUNTER — Other Ambulatory Visit: Payer: Self-pay | Admitting: Cardiovascular Disease

## 2015-07-16 NOTE — Telephone Encounter (Signed)
REFILL 

## 2015-07-17 ENCOUNTER — Other Ambulatory Visit: Payer: Self-pay | Admitting: Cardiovascular Disease

## 2015-07-17 NOTE — Telephone Encounter (Signed)
REFILL 

## 2015-07-18 ENCOUNTER — Other Ambulatory Visit: Payer: Self-pay | Admitting: *Deleted

## 2015-07-27 ENCOUNTER — Other Ambulatory Visit: Payer: Self-pay | Admitting: Cardiovascular Disease

## 2015-08-16 ENCOUNTER — Other Ambulatory Visit: Payer: Self-pay | Admitting: Cardiovascular Disease

## 2015-08-16 NOTE — Telephone Encounter (Signed)
REFILL 

## 2015-08-21 ENCOUNTER — Other Ambulatory Visit: Payer: Self-pay | Admitting: Cardiovascular Disease

## 2015-08-27 ENCOUNTER — Other Ambulatory Visit: Payer: Self-pay | Admitting: Cardiovascular Disease

## 2015-09-17 ENCOUNTER — Other Ambulatory Visit: Payer: Self-pay | Admitting: Cardiovascular Disease

## 2015-09-17 NOTE — Telephone Encounter (Signed)
Rx request sent to pharmacy.  

## 2015-09-25 ENCOUNTER — Other Ambulatory Visit: Payer: Self-pay | Admitting: Cardiovascular Disease

## 2015-09-25 NOTE — Telephone Encounter (Signed)
Rx(s) sent to pharmacy electronically.  

## 2015-10-01 ENCOUNTER — Other Ambulatory Visit: Payer: Self-pay | Admitting: Internal Medicine

## 2015-10-08 ENCOUNTER — Other Ambulatory Visit: Payer: Self-pay | Admitting: Cardiovascular Disease

## 2015-10-16 ENCOUNTER — Other Ambulatory Visit: Payer: Self-pay | Admitting: Cardiovascular Disease

## 2015-10-19 ENCOUNTER — Other Ambulatory Visit: Payer: Self-pay | Admitting: Cardiovascular Disease

## 2015-10-24 ENCOUNTER — Other Ambulatory Visit: Payer: Self-pay | Admitting: Cardiovascular Disease

## 2015-10-29 ENCOUNTER — Other Ambulatory Visit: Payer: Self-pay | Admitting: Cardiovascular Disease

## 2015-10-30 ENCOUNTER — Telehealth: Payer: Self-pay | Admitting: Cardiovascular Disease

## 2015-10-30 MED ORDER — SILDENAFIL CITRATE 50 MG PO TABS: 50.0000 mg | ORAL_TABLET | Freq: Every day | ORAL | Status: DC | PRN

## 2015-10-30 NOTE — Telephone Encounter (Signed)
OK to refill Viagra that Dr. Loletha Grayer has prescribed.

## 2015-10-30 NOTE — Telephone Encounter (Signed)
°*  STAT* If patient is at the pharmacy, call can be transferred to refill team.   1. Which medications need to be refilled? (please list name of each medication and dose if known) Viagra 50mg   2. Which pharmacy/location (including street and city if local pharmacy) is medication to be sent to?[facility] on [address]  and [address]   3. Do they need a 30 day or 90 day supply? [place]

## 2015-10-30 NOTE — Telephone Encounter (Signed)
Pt's medication was sent to his pharmacy.confirmation received.

## 2015-10-30 NOTE — Telephone Encounter (Signed)
Pt requesting a refill. Please advise °

## 2015-11-19 ENCOUNTER — Other Ambulatory Visit: Payer: Self-pay | Admitting: Cardiovascular Disease

## 2015-11-21 ENCOUNTER — Other Ambulatory Visit: Payer: Self-pay | Admitting: Cardiovascular Disease

## 2015-11-21 NOTE — Telephone Encounter (Signed)
Rx request sent to pharmacy.  

## 2015-11-25 ENCOUNTER — Other Ambulatory Visit: Payer: Self-pay | Admitting: Cardiovascular Disease

## 2015-11-28 ENCOUNTER — Other Ambulatory Visit: Payer: Self-pay | Admitting: Cardiovascular Disease

## 2016-01-01 ENCOUNTER — Other Ambulatory Visit: Payer: Self-pay | Admitting: Cardiovascular Disease

## 2016-01-02 ENCOUNTER — Telehealth: Payer: Self-pay | Admitting: Cardiovascular Disease

## 2016-01-02 NOTE — Telephone Encounter (Signed)
Peter Congo called in stating that the patient is needing new supplies for his for his CPAP machine. Peter Congo says that the las time Dr. Claiborne Billings wrong a prescription for him was in 2012. She states that he is needing a Mask, strap,hose,and filter. Please f/u with her if you need to

## 2016-01-02 NOTE — Telephone Encounter (Signed)
Peter Congo called in stating that the pt needs new supplies for her CPAP machine

## 2016-01-02 NOTE — Telephone Encounter (Addendum)
Gregory [name] called in stating that the patient is needing new supplies for his for his CPAP machine. Gregory [name] says that the last time Dr. Claiborne Billings wrote a prescription for him was in 2012. She states that he is needing a Mask, strap,hose,and filter. Please f/u with her if you need to.  Thanks

## 2016-01-02 NOTE — Telephone Encounter (Signed)
Wrong provider

## 2016-01-09 ENCOUNTER — Other Ambulatory Visit: Payer: Self-pay | Admitting: Cardiovascular Disease

## 2016-01-09 NOTE — Telephone Encounter (Signed)
Rx request sent to pharmacy.  

## 2016-01-11 ENCOUNTER — Telehealth: Payer: Self-pay | Admitting: *Deleted

## 2016-01-11 NOTE — Telephone Encounter (Signed)
Called patient's insurance company to try to start a PA on viagra.  They require the pharmacy to call them to get the process started.  [id number].

## 2016-01-16 ENCOUNTER — Other Ambulatory Visit: Payer: Self-pay | Admitting: Cardiovascular Disease

## 2016-01-17 ENCOUNTER — Other Ambulatory Visit: Payer: Self-pay | Admitting: Cardiovascular Disease

## 2016-01-17 NOTE — Telephone Encounter (Signed)
Rx request sent to pharmacy.  

## 2016-01-18 ENCOUNTER — Other Ambulatory Visit: Payer: Self-pay | Admitting: Cardiovascular Disease

## 2016-01-18 ENCOUNTER — Telehealth: Payer: Self-pay | Admitting: Cardiovascular Disease

## 2016-01-18 NOTE — Telephone Encounter (Signed)
Rx request sent to pharmacy.  

## 2016-01-18 NOTE — Telephone Encounter (Signed)
Gregory [name] is calling because the pharmacy( [facility] on [address] )  states they faxed over a request for Prior Auth for his Hydralazine  50 mg . Please call   Thanks

## 2016-01-22 ENCOUNTER — Other Ambulatory Visit: Payer: Self-pay | Admitting: Cardiovascular Disease

## 2016-01-22 ENCOUNTER — Encounter: Payer: Self-pay | Admitting: Cardiovascular Disease

## 2016-01-22 ENCOUNTER — Ambulatory Visit (INDEPENDENT_AMBULATORY_CARE_PROVIDER_SITE_OTHER): Payer: Worker's Compensation | Admitting: Cardiovascular Disease

## 2016-01-22 VITALS — BP 146/92 | HR 68 | Ht 78.0 in | Wt 284.0 lb

## 2016-01-22 DIAGNOSIS — I472 Ventricular tachycardia, unspecified: Secondary | ICD-10-CM

## 2016-01-22 DIAGNOSIS — I255 Ischemic cardiomyopathy: Secondary | ICD-10-CM

## 2016-01-22 DIAGNOSIS — E785 Hyperlipidemia, unspecified: Secondary | ICD-10-CM

## 2016-01-22 DIAGNOSIS — I5042 Chronic combined systolic (congestive) and diastolic (congestive) heart failure: Secondary | ICD-10-CM

## 2016-01-22 DIAGNOSIS — I2510 Atherosclerotic heart disease of native coronary artery without angina pectoris: Secondary | ICD-10-CM | POA: Diagnosis not present

## 2016-01-22 DIAGNOSIS — N522 Drug-induced erectile dysfunction: Secondary | ICD-10-CM

## 2016-01-22 DIAGNOSIS — I10 Essential (primary) hypertension: Secondary | ICD-10-CM

## 2016-01-22 DIAGNOSIS — Z79899 Other long term (current) drug therapy: Secondary | ICD-10-CM

## 2016-01-22 DIAGNOSIS — Z95810 Presence of automatic (implantable) cardiac defibrillator: Secondary | ICD-10-CM

## 2016-01-22 MED ORDER — SACUBITRIL-VALSARTAN 97-103 MG PO TABS: 1.0000 | ORAL_TABLET | Freq: Two times a day (BID) | ORAL | Status: DC

## 2016-01-22 NOTE — Patient Instructions (Signed)
Your physician has recommended you make the following change in your medication:   STOP RAMIPRIL  48 HOURS LATER-------------------------------------------------------------------------------------------------------------------------------------------------------------------------------------------------------------------------------------------------------------------------  START ENTRESTO 49/51 MG TWICE DAILY X 2 WEEKS THEN INCREASE TO 97/103 MG TWICE DAILY (THIS HAS BEEN SENT TO YOUR PHARMACY).  Your physician recommends that you schedule a follow-up appointment in: 3-4 [place], [place]  Remote monitoring is used to monitor your ICD from home. This monitoring reduces the number of office visits required to check your device to one time per year. It allows [place] to monitor the functioning of your device to ensure it is working properly. You are scheduled for a device check from home on Apr 21, 2016. You may send your transmission at any time that day. If you have a wireless device, the transmission will be sent automatically. After your physician reviews your transmission, you will receive a postcard with your next transmission date.  Dr. Sallyanne Kuster recommends that you schedule a follow-up appointment in: [place] ([place]).

## 2016-01-22 NOTE — Telephone Encounter (Signed)
Spoke with pharmacist. Patient has already picked up the hydralazine with a $0 co-pay.

## 2016-01-22 NOTE — Progress Notes (Signed)
Patient ID: Gregory [name], male   DOB: [birth year], 55 y.o.   MRN: [id number]    Cardiology Office Note    Date:  01/22/2016   ID:  Gregory, [name] Gregory [name], MRN [id number]  PCP:  No PCP Per Patient  Cardiologist:   Gregory Klein, MD   Chief Complaint  Patient presents with  . Follow-up     no chest pain, no shortness of breath, no edema, no pain or cramping in legs, no lightheadedness or dizziness    History of Present Illness:  Gregory [name] is a 55 y.o. male with moderate ischemic cardiomyopathy, history of sustained ventricular tachycardia, chronic total occlusion of the right coronary artery with previous drug-eluting stents to the diagonal artery and circumflex coronary artery, ischemic cardiomyopathy (inferolateral scar, EF 45-50%), well compensated chronic systolic and diastolic heart failure, obstructive sleep apnea on CPAP, hyperlipidemia and erectile dysfunction.  He has a dual-chamber Medtronic Virtuoso defibrillator that is getting close to recommend replacement. Today battery voltage is 2.68 V (ERI 2.63 V). He has 11% atrial pacing and no ventricular pacing. There has been no atrial fibrillation and no ventricular tachycardia since his last device check. The optivol readings are okay. His activity level has dropped from roughly 3 hours a day to 2 hours per day. He blames this on the fact that both his mother and brother have been very ill and he has been helping with her care.  Otherwise he goes to the gym on a daily basis exercising cardio for one hour and then lifting a few weights. He has noticed some decline in his muscle strength, but no deterioration in his stamina.    Past Medical History  Diagnosis Date  . CAD (coronary artery disease) 12/01/2013  . Ischemic cardiomyopathy 12/01/2013  . Chronic combined systolic and diastolic CHF, NYHA class 1 ([place]) 12/01/2013  . Erectile dysfunction 12/01/2013  . HTN (hypertension) 12/01/2013    . Hyperlipidemia 12/01/2013    Past Surgical History  Procedure Laterality Date  . Cardiac defibrillator placement    . Wrist tenodesis    . Achilles tendon repair      lft foot    Outpatient Prescriptions Prior to Visit  Medication Sig Dispense Refill  . amLODipine (NORVASC) 10 MG tablet TAKE 1 TABLET BY MOUTH DAILY 30 tablet 2  . aspirin 81 MG tablet Take 81 mg by mouth daily.    Marland Kitchen atorvastatin (LIPITOR) 80 MG tablet TAKE 1 TABLET BY MOUTH EVERY DAY 90 tablet 1  . clopidogrel (PLAVIX) 75 MG tablet TAKE 1 TABLET BY MOUTH EVERY DAY WITH BREAKFAST 90 tablet 3  . COREG CR 80 MG 24 hr capsule TAKE 1 CAPSULE BY MOUTH EVERY DAY 30 capsule 3  . hydrALAZINE (APRESOLINE) 50 MG tablet TAKE 2 TABLETS BY MOUTH TWICE DAILY. 360 tablet 0  . hydrochlorothiazide (HYDRODIURIL) 25 MG tablet TAKE 1 TABLET BY MOUTH EVERY DAY 90 tablet 0  . mexiletine (MEXITIL) 150 MG capsule TAKE 2 CAPSULES BY MOUTH TWICE DAILY 120 capsule 6  . VIAGRA 50 MG tablet TAKE 1 TABLET(50 MG) BY MOUTH DAILY AS NEEDED 30 tablet 0  . potassium chloride (K-DUR,KLOR-CON) 10 MEQ tablet TAKE 1 TABLET BY MOUTH EVERY DAY 90 tablet 1  . ramipril (ALTACE) 10 MG capsule TAKE 2 CAPSULES BY MOUTH EVERY DAY 60 capsule 2   No facility-administered medications prior to visit.     Allergies:   Review of patient's allergies indicates no known allergies.   Social History  Social History  . Marital Status: Single    Spouse Name: N/A  . Number of Children: N/A  . Years of Education: N/A   Social History Main Topics  . Smoking status: Former Smoker -- 0.50 packs/day for 30 years    Types: Cigarettes    Quit date: 05/31/2013  . Smokeless tobacco: None  . Alcohol Use: No  . Drug Use: None  . Sexual Activity: Not Asked   Other Topics Concern  . None   Social History Narrative     Family History:  The patient's family history includes Brain cancer in his father; Heart disease in his mother.   ROS:   Please see the history of  present illness.    ROS All other systems reviewed and are negative.   PHYSICAL EXAM:   VS:  BP 146/92 mmHg  Pulse 68  Ht 6\' 6"  (1.981 m)  Wt 128.822 kg (284 lb)  BMI 32.83 kg/m2   GEN: Well nourished, well developed, in no acute distress HEENT: normal Neck: no JVD, carotid bruits, or masses Cardiac: RRR; no murmurs, rubs, or gallops,no edema ,healthy left subclavian defibrillator site Respiratory:  clear to auscultation bilaterally, normal work of breathing GI: soft, nontender, nondistended, + BS MS: no deformity or atrophy Skin: warm and dry, no rash Neuro:  Alert and Oriented x 3, Strength and sensation are intact Psych: euthymic mood, full affect  Wt Readings from Last 3 Encounters:  01/22/16 128.822 kg (284 lb)  02/26/15 137.984 kg (304 lb 3.2 oz)  02/13/15 136.76 kg (301 lb 8 oz)      Studies/Labs Reviewed:   EKG:  EKG is ordered today.  The ekg ordered today demonstrates normal sinus rhythm, old inferior wall MI, QTC 448 ms  Recent Labs: No results found for requested labs within last 365 days.   Lipid Panel    Component Value Date/Time   CHOL 116 02/28/2014 0837   TRIG 54 02/28/2014 0837   HDL 25* 02/28/2014 0837   CHOLHDL 4.6 02/28/2014 0837   VLDL 11 02/28/2014 0837   LDLCALC 80 02/28/2014 0837     ASSESSMENT:    1. Chronic combined systolic and diastolic CHF, NYHA class 1 ([place])   2. Ischemic cardiomyopathy   3. Sustained ventricular tachycardia ([place])   4. Coronary artery disease involving native coronary artery of native heart without angina pectoris   5. Essential hypertension   6. ICD - dual chamber Medtronic programmed VVI   7. Hyperlipidemia   8. Drug-induced erectile dysfunction      PLAN:  In order of problems listed above:  1. CHF: NYHA class 1, clinically euvolemic without taking loop diuretics. Plan to switch from ACE inhibitor to [facility] for better long-term prognosis. 2. CMP: has a large inferolateral scar which is probably the  source of VT 3. VT: device interrogation shows no new episodes. Tolerating mexiletine without any serious side effects 4. CAD: no angina, no evidence of reversible ischemia on last functional study. 5. HTN: his blood pressure is elevated today and he reports similar values when he checks it at home. Target blood pressure less than 130/80. Since we'll be switching to entresto, suspect we will go to the full strength of this medication. If his blood pressure is still elevated at that time consider further increasing his dose of hydralazine. He is already on the maximum usual doses of carvedilol and amlodipine. I would like to avoid higher doses of diuretic due to the risk of electrolyte imbalances. We'll  bring him back for blood pressure check and a discussion with our clinical pharmacist after he has been on the maximum dose of Entresto for a week. 6. ICD: normal device function, anticipate need for generator change out in roughly 6-12 months 7. HLP: he will be due a repeat lipid profile in the next few weeks 8. Good response to PDE 5 inhibitors, he knows that he should never mix these strokes with nitrates    Medication Adjustments/Labs and Tests Ordered: Current medicines are reviewed at length with the patient today.  Concerns regarding medicines are outlined above.  Medication changes, Labs and Tests ordered today are listed in the Patient Instructions below. Patient Instructions  Your physician has recommended you make the following change in your medication:   STOP RAMIPRIL  48 HOURS LATER-------------------------------------------------------------------------------------------------------------------------------------------------------------------------------------------------------------------------------------------------------------------------  START ENTRESTO 49/51 MG TWICE DAILY X 2 WEEKS THEN INCREASE TO 97/103 MG TWICE DAILY (THIS HAS BEEN SENT TO YOUR PHARMACY).  Your physician  recommends that you schedule a follow-up appointment in: 3-4 [place], [place] [address]  Remote monitoring is used to monitor your ICD from home. This monitoring reduces the number of office visits required to check your device to one time per year. It allows [place] to monitor the functioning of your device to ensure it is working properly. You are scheduled for a device check from home on Apr 21, 2016. You may send your transmission at any time that day. If you have a wireless device, the transmission will be sent automatically. After your physician reviews your transmission, you will receive a postcard with your next transmission date.  Dr. Sallyanne Kuster recommends that you schedule a follow-up appointment in: [place] ([place]).        Mikael Spray, MD  01/22/2016 4:27 PM    [facility] [address], [place], [place]  [address] Phone: [phone]; Fax: [phone]

## 2016-01-22 NOTE — Addendum Note (Signed)
Addended by: Janett Labella A on: 01/22/2016 05:19 PM   Modules accepted: Orders

## 2016-01-22 NOTE — Telephone Encounter (Signed)
Rx refill sent to pharmacy. 

## 2016-01-25 ENCOUNTER — Telehealth: Payer: Self-pay | Admitting: *Deleted

## 2016-01-25 DIAGNOSIS — Z79899 Other long term (current) drug therapy: Secondary | ICD-10-CM

## 2016-01-25 DIAGNOSIS — E785 Hyperlipidemia, unspecified: Secondary | ICD-10-CM

## 2016-01-25 NOTE — Telephone Encounter (Signed)
Lab order mailed to patient to have done FASTING at his convenience.

## 2016-01-25 NOTE — Telephone Encounter (Signed)
-----   Message from Sanda Klein, MD sent at 01/22/2016  4:31 PM EST ----- Lipids and CMET at his convenience please

## 2016-01-28 ENCOUNTER — Other Ambulatory Visit: Payer: Self-pay | Admitting: Cardiovascular Disease

## 2016-02-11 ENCOUNTER — Ambulatory Visit: Payer: Self-pay

## 2016-02-11 VITALS — BP 140/90 | HR 60 | Ht 78.0 in | Wt 278.0 lb

## 2016-02-11 NOTE — Patient Instructions (Signed)
Continue same medications  Keep appointment with Erasmo Downer 02/14/16 at 9:30 am for B/P check  Bring home B/P cuff to check with office cuff  Bring list of B/P readings

## 2016-02-11 NOTE — Progress Notes (Signed)
Thanks, I agree

## 2016-02-11 NOTE — Progress Notes (Signed)
1.) Reason for visit: Walk In  B/P check  2.) Name of MD requesting visit: Croitoru  3.) H&P: 2 days ago started taking increased dose of Entresto 97/103 twice a day.  4.) ROS related to problem: Light headed since increased dose of Entresto.Blood Pressure at home 100/70.B/P at office 140/90 pulse 60.  5.) Assessment and plan per MD: Keep B/P appointment 02/14/16 as planned.Bring home B/P cuff to check with office cuff.Bring B/P readings.

## 2016-02-14 ENCOUNTER — Ambulatory Visit (INDEPENDENT_AMBULATORY_CARE_PROVIDER_SITE_OTHER): Payer: Worker's Compensation | Admitting: Pharmacist Clinician (PhC)/ Clinical Pharmacy Specialist

## 2016-02-14 ENCOUNTER — Encounter: Payer: Self-pay | Admitting: Pharmacist Clinician (PhC)/ Clinical Pharmacy Specialist

## 2016-02-14 VITALS — BP 126/84 | HR 60 | Ht 78.0 in | Wt 280.5 lb

## 2016-02-14 DIAGNOSIS — I10 Essential (primary) hypertension: Secondary | ICD-10-CM

## 2016-02-14 NOTE — Progress Notes (Signed)
     02/14/2016 [place] Araki 1961-03-11 [id number]   HPI:  Gregory [name] is a 55 y.o. male patient of Dr Sallyanne Kuster, with a PMH below who presents today for a hypertension evaluation.  [name] recently discontinued his ramipril and started on Entresto 49/51.  He took it for 2 weeks then increased dose to 97/103.  About 3-4 days after increasing the dose patient reported an episode of lightheadedness.  He came into the office and was found to have a normal BP.  He reports no problems since that day, other than occasional postural hypotension.  Cardiac Hx: ischemic cardiomyopathy, chronic total occlusion of R coronary artery, chronic systolic and diastolic HF, OSA with CPAP, hyperlipidemia, ED  Family Hx: mother has pacemaker, alive at 38, daughter and brother both with hypertension  Social Hx: quit smoking 3-4 years ago, no alcohol, 1-2 cups of coffee per day  Exercise: gym 1 hour per day 6 days per week (cardio plus weights)  Home BP readings: none  Current antihypertensive medications: amlodipine 10 mg, carvedilol CR 80 mg, hydralazine 50 mg bid, hctz 25 mg qd, Entresto 97/103 bid   Wt Readings from Last 3 Encounters:  02/14/16 280 lb 8 oz (127.234 kg)  02/11/16 278 lb (126.1 kg)  01/22/16 284 lb (128.822 kg)   BP Readings from Last 3 Encounters:  02/14/16 126/84  02/11/16 140/90  01/22/16 146/92   Pulse Readings from Last 3 Encounters:  02/14/16 60  02/11/16 60  01/22/16 68    Current Outpatient Prescriptions  Medication Sig Dispense Refill  . amLODipine (NORVASC) 10 MG tablet TAKE 1 TABLET BY MOUTH DAILY 30 tablet 2  . aspirin 81 MG tablet Take 81 mg by mouth daily.    Marland Kitchen atorvastatin (LIPITOR) 80 MG tablet TAKE 1 TABLET BY MOUTH EVERY DAY 90 tablet 1  . clopidogrel (PLAVIX) 75 MG tablet TAKE 1 TABLET BY MOUTH EVERY DAY WITH BREAKFAST 90 tablet 3  . COREG CR 80 MG 24 hr capsule TAKE 1 CAPSULE BY MOUTH EVERY DAY 30 capsule 3  . hydrALAZINE  (APRESOLINE) 50 MG tablet TAKE 2 TABLETS BY MOUTH TWICE DAILY. 360 tablet 0  . hydrochlorothiazide (HYDRODIURIL) 25 MG tablet TAKE 1 TABLET BY MOUTH EVERY DAY 90 tablet 0  . mexiletine (MEXITIL) 150 MG capsule TAKE 2 CAPSULES BY MOUTH TWICE DAILY 120 capsule 6  . potassium chloride (K-DUR,KLOR-CON) 10 MEQ tablet TAKE 1 TABLET BY MOUTH EVERY DAY 90 tablet 3  . sacubitril-valsartan (ENTRESTO) 97-103 MG Take 1 tablet by mouth 2 (two) times daily. 60 tablet 3  . VIAGRA 50 MG tablet TAKE 1 TABLET(50 MG) BY MOUTH DAILY AS NEEDED 30 tablet 1   No current facility-administered medications for this visit.    No Known Allergies  Past Medical History  Diagnosis Date  . CAD (coronary artery disease) 12/01/2013  . Ischemic cardiomyopathy 12/01/2013  . Chronic combined systolic and diastolic CHF, NYHA class 1 ([place]) 12/01/2013  . Erectile dysfunction 12/01/2013  . HTN (hypertension) 12/01/2013  . Hyperlipidemia 12/01/2013    Blood pressure 126/84, pulse 60, height 6\' 6"  (1.981 m), weight 280 lb 8 oz (127.234 kg).    Tommy Medal PharmD CPP [facility]

## 2016-02-14 NOTE — Assessment & Plan Note (Addendum)
Gregory [name] is tolerating his full dose Entresto without problem.  He still reports occasional postural hypotension, usually after exercise.  Today his BP is good at 126/94.  He is to continue with all of his current medications and will follow up with Dr. Sallyanne Kuster as scheduled.  He knows to call should the hypotension become problematic.

## 2016-02-15 ENCOUNTER — Other Ambulatory Visit: Payer: Self-pay | Admitting: Cardiovascular Disease

## 2016-02-15 NOTE — Telephone Encounter (Signed)
Rx request sent to pharmacy.  

## 2016-02-21 ENCOUNTER — Other Ambulatory Visit: Payer: Self-pay | Admitting: Cardiovascular Disease

## 2016-02-21 NOTE — Telephone Encounter (Signed)
Rx(s) sent to pharmacy electronically.  

## 2016-03-09 ENCOUNTER — Other Ambulatory Visit: Payer: Self-pay | Admitting: Cardiovascular Disease

## 2016-03-28 ENCOUNTER — Other Ambulatory Visit: Payer: Self-pay | Admitting: Cardiovascular Disease

## 2016-04-08 ENCOUNTER — Other Ambulatory Visit: Payer: Self-pay | Admitting: Cardiovascular Disease

## 2016-04-08 NOTE — Telephone Encounter (Signed)
Rx(s) sent to pharmacy electronically.  

## 2016-04-22 ENCOUNTER — Other Ambulatory Visit: Payer: Self-pay | Admitting: Cardiovascular Disease

## 2016-04-28 ENCOUNTER — Other Ambulatory Visit: Payer: Self-pay | Admitting: Cardiovascular Disease

## 2016-04-29 ENCOUNTER — Telehealth: Payer: Self-pay | Admitting: Cardiovascular Disease

## 2016-04-29 MED ORDER — SILDENAFIL CITRATE 50 MG PO TABS: 50.0000 mg | ORAL_TABLET | Freq: Every day | ORAL | Status: DC | PRN

## 2016-04-29 NOTE — Telephone Encounter (Signed)
New message       *STAT* If patient is at the pharmacy, call can be transferred to refill team.   1. Which medications need to be refilled? (please list name of each medication and dose if known) Viagra 50 mg po   2. Which pharmacy/location (including street and city if local pharmacy) is medication to be sent to? [facility] on [address]  3. Do they need a 30 day or 90 day supply? 30 day   The pt went to the pharmacy yesterday the medications needed authorization according to pharmacy tech by the MD

## 2016-04-29 NOTE — Telephone Encounter (Signed)
Yes please

## 2016-04-29 NOTE — Telephone Encounter (Signed)
Rx(s) sent to pharmacy electronically.  

## 2016-04-30 ENCOUNTER — Telehealth: Payer: Self-pay | Admitting: Cardiovascular Disease

## 2016-04-30 NOTE — Telephone Encounter (Signed)
Returned call to patient. He picked up his viagra Rx and noticed it has no refills available. He states the bottle does not say MD authorization required. Informed him that the med refill next time around will go thru the same process to be refilled.

## 2016-04-30 NOTE — Telephone Encounter (Signed)
New message     Pt wanting  About the  Authorization for the refills. Pt wants to speak with nurse

## 2016-05-03 LAB — CUP PACEART INCLINIC DEVICE CHECK
Battery Voltage: 2.68 V
Brady Statistic AP VP Percent: 0.04 %
Brady Statistic AP VS Percent: 11.3 %
Brady Statistic AS VP Percent: 0 %
Brady Statistic RA Percent Paced: 11.33 %
HIGH POWER IMPEDANCE MEASURED VALUE: 46 Ohm
HIGH POWER IMPEDANCE MEASURED VALUE: 57 Ohm
Implantable Lead Implant Date: 20101117
Implantable Lead Location: 753860
Implantable Lead Model: 5076
Implantable Lead Model: 7121
Lead Channel Impedance Value: 475 Ohm
Lead Channel Impedance Value: 551 Ohm
Lead Channel Sensing Intrinsic Amplitude: 4.25 mV
Lead Channel Sensing Intrinsic Amplitude: 4.25 mV
Lead Channel Setting Pacing Amplitude: 2.5 V
MDC IDC LEAD IMPLANT DT: 20051120
MDC IDC LEAD LOCATION: 753859
MDC IDC MSMT LEADCHNL RV SENSING INTR AMPL: 17.25 mV
MDC IDC MSMT LEADCHNL RV SENSING INTR AMPL: 17.25 mV
MDC IDC SESS DTM: 20170207155004
MDC IDC SET LEADCHNL RA PACING AMPLITUDE: 2.5 V
MDC IDC SET LEADCHNL RV PACING PULSEWIDTH: 0.4 ms
MDC IDC SET LEADCHNL RV SENSING SENSITIVITY: 0.6 mV
MDC IDC STAT BRADY AS VS PERCENT: 88.66 %
MDC IDC STAT BRADY RV PERCENT PACED: 0.04 %

## 2016-05-04 ENCOUNTER — Other Ambulatory Visit: Payer: Self-pay | Admitting: Internal Medicine

## 2016-05-04 ENCOUNTER — Emergency Department (HOSPITAL_COMMUNITY): Payer: Worker's Compensation

## 2016-05-04 ENCOUNTER — Inpatient Hospital Stay (HOSPITAL_COMMUNITY)
Admission: EM | Admit: 2016-05-04 | Discharge: 2016-05-07 | DRG: 287 | Disposition: A | Discharge status: Home / Self Care | Payer: Worker's Compensation | Attending: Cardiology | Admitting: Cardiology

## 2016-05-04 ENCOUNTER — Encounter (HOSPITAL_COMMUNITY): Payer: Self-pay | Admitting: *Deleted

## 2016-05-04 DIAGNOSIS — I5022 Chronic systolic (congestive) heart failure: Secondary | ICD-10-CM

## 2016-05-04 DIAGNOSIS — I472 Ventricular tachycardia, unspecified: Secondary | ICD-10-CM

## 2016-05-04 DIAGNOSIS — I110 Hypertensive heart disease with heart failure: Secondary | ICD-10-CM | POA: Diagnosis present

## 2016-05-04 DIAGNOSIS — Z87891 Personal history of nicotine dependence: Secondary | ICD-10-CM

## 2016-05-04 DIAGNOSIS — R0602 Shortness of breath: Secondary | ICD-10-CM | POA: Diagnosis not present

## 2016-05-04 DIAGNOSIS — I5042 Chronic combined systolic (congestive) and diastolic (congestive) heart failure: Secondary | ICD-10-CM | POA: Diagnosis present

## 2016-05-04 DIAGNOSIS — E876 Hypokalemia: Secondary | ICD-10-CM | POA: Diagnosis present

## 2016-05-04 DIAGNOSIS — R609 Edema, unspecified: Secondary | ICD-10-CM

## 2016-05-04 DIAGNOSIS — R52 Pain, unspecified: Secondary | ICD-10-CM

## 2016-05-04 DIAGNOSIS — I2510 Atherosclerotic heart disease of native coronary artery without angina pectoris: Secondary | ICD-10-CM | POA: Insufficient documentation

## 2016-05-04 DIAGNOSIS — E785 Hyperlipidemia, unspecified: Secondary | ICD-10-CM | POA: Diagnosis present

## 2016-05-04 DIAGNOSIS — I272 Other secondary pulmonary hypertension: Secondary | ICD-10-CM | POA: Diagnosis present

## 2016-05-04 DIAGNOSIS — Z95810 Presence of automatic (implantable) cardiac defibrillator: Secondary | ICD-10-CM

## 2016-05-04 DIAGNOSIS — I255 Ischemic cardiomyopathy: Secondary | ICD-10-CM | POA: Insufficient documentation

## 2016-05-04 DIAGNOSIS — I959 Hypotension, unspecified: Secondary | ICD-10-CM | POA: Diagnosis present

## 2016-05-04 DIAGNOSIS — I252 Old myocardial infarction: Secondary | ICD-10-CM

## 2016-05-04 DIAGNOSIS — I2582 Chronic total occlusion of coronary artery: Secondary | ICD-10-CM | POA: Diagnosis present

## 2016-05-04 DIAGNOSIS — G4733 Obstructive sleep apnea (adult) (pediatric): Secondary | ICD-10-CM | POA: Diagnosis present

## 2016-05-04 DIAGNOSIS — Z955 Presence of coronary angioplasty implant and graft: Secondary | ICD-10-CM

## 2016-05-04 DIAGNOSIS — Z7982 Long term (current) use of aspirin: Secondary | ICD-10-CM

## 2016-05-04 DIAGNOSIS — Z7902 Long term (current) use of antithrombotics/antiplatelets: Secondary | ICD-10-CM

## 2016-05-04 HISTORY — DX: Sleep apnea, unspecified: G47.30

## 2016-05-04 LAB — TROPONIN I: Troponin I: 0.03 ng/mL (ref <0.031)

## 2016-05-04 LAB — I-STAT TROPONIN, ED: Troponin i, poc: 0.02 ng/mL (ref 0.00–0.08)

## 2016-05-04 LAB — COMPREHENSIVE METABOLIC PANEL
ALK PHOS: 85 U/L (ref 38–126)
ALT: 41 U/L (ref 17–63)
ANION GAP: 9 (ref 5–15)
AST: 21 U/L (ref 15–41)
Albumin: 3.4 g/dL — ABNORMAL LOW (ref 3.5–5.0)
BUN: 13 mg/dL (ref 6–20)
CALCIUM: 8.8 mg/dL — AB (ref 8.9–10.3)
CO2: 20 mmol/L — ABNORMAL LOW (ref 22–32)
Chloride: 107 mmol/L (ref 101–111)
Creatinine, Ser: 1.23 mg/dL (ref 0.61–1.24)
GFR calc non Af Amer: >60 mL/min (ref >60)
Glucose, Bld: 125 mg/dL — ABNORMAL HIGH (ref 65–99)
POTASSIUM: 3.6 mmol/L (ref 3.5–5.1)
SODIUM: 136 mmol/L (ref 135–145)
Total Bilirubin: 0.4 mg/dL (ref 0.3–1.2)
Total Protein: 6.4 g/dL — ABNORMAL LOW (ref 6.5–8.1)

## 2016-05-04 LAB — CBC WITH DIFFERENTIAL/PLATELET
BASOS PCT: 0 %
Basophils Absolute: 0 10*3/uL (ref 0.0–0.1)
EOS ABS: 0.1 10*3/uL (ref 0.0–0.7)
EOS PCT: 1 %
HCT: 45.8 % (ref 39.0–52.0)
HEMOGLOBIN: 15.2 g/dL (ref 13.0–17.0)
LYMPHS ABS: 3.1 10*3/uL (ref 0.7–4.0)
Lymphocytes Relative: 26 %
MCH: 31.4 pg (ref 26.0–34.0)
MCHC: 33.2 g/dL (ref 30.0–36.0)
MCV: 94.6 fL (ref 78.0–100.0)
MONO ABS: 1.1 10*3/uL — AB (ref 0.1–1.0)
MONOS PCT: 9 %
Neutro Abs: 7.8 10*3/uL — ABNORMAL HIGH (ref 1.7–7.7)
Neutrophils Relative %: 64 %
PLATELETS: 203 10*3/uL (ref 150–400)
RBC: 4.84 MIL/uL (ref 4.22–5.81)
RDW: 13.4 % (ref 11.5–15.5)
WBC: 12.2 10*3/uL — ABNORMAL HIGH (ref 4.0–10.5)

## 2016-05-04 LAB — MRSA PCR SCREENING: MRSA BY PCR: NEGATIVE

## 2016-05-04 MED ORDER — SODIUM CHLORIDE 0.9 % IV BOLUS (SEPSIS)
1000.0000 mL | Freq: Once | INTRAVENOUS | Status: AC
  Administered 2016-05-04: 1000 mL via INTRAVENOUS

## 2016-05-04 MED ORDER — ETOMIDATE 2 MG/ML IV SOLN
INTRAVENOUS | Status: AC
  Administered 2016-05-04: 20 mg
  Filled 2016-05-04: qty 10

## 2016-05-04 MED ORDER — CLOPIDOGREL BISULFATE 75 MG PO TABS
75.0000 mg | ORAL_TABLET | Freq: Every day | ORAL | Status: AC
  Administered 2016-05-05: 75 mg via ORAL
  Filled 2016-05-04: qty 1

## 2016-05-04 MED ORDER — ONDANSETRON HCL 4 MG/2ML IJ SOLN
4.0000 mg | Freq: Four times a day (QID) | INTRAMUSCULAR | Status: DC | PRN
Start: 2016-05-04 — End: 2016-05-07

## 2016-05-04 MED ORDER — ACETAMINOPHEN 325 MG PO TABS: 650.0000 mg | ORAL_TABLET | ORAL | Status: DC | PRN

## 2016-05-04 MED ORDER — CLOPIDOGREL BISULFATE 75 MG PO TABS: 75.0000 mg | ORAL_TABLET | Freq: Once | ORAL | Status: DC

## 2016-05-04 MED ORDER — ASPIRIN 81 MG PO TABS: 81.0000 mg | ORAL_TABLET | Freq: Every day | ORAL | Status: DC

## 2016-05-04 MED ORDER — ASPIRIN 81 MG PO CHEW
324.0000 mg | CHEWABLE_TABLET | ORAL | Status: AC
  Administered 2016-05-04: 324 mg via ORAL
  Filled 2016-05-04: qty 4

## 2016-05-04 MED ORDER — POTASSIUM CHLORIDE CRYS ER 10 MEQ PO TBCR: 10.0000 meq | EXTENDED_RELEASE_TABLET | Freq: Every day | ORAL | Status: DC

## 2016-05-04 MED ORDER — AMIODARONE HCL IN DEXTROSE 360-4.14 MG/200ML-% IV SOLN
30.0000 mg/h | INTRAVENOUS | Status: DC
  Administered 2016-05-04 – 2016-05-06 (×4): 30 mg/h via INTRAVENOUS
  Filled 2016-05-04 (×3): qty 200

## 2016-05-04 MED ORDER — SODIUM CHLORIDE 0.9 % IV SOLN: 250.0000 mL | INTRAVENOUS | Status: DC | PRN

## 2016-05-04 MED ORDER — SODIUM CHLORIDE 0.9% FLUSH: 3.0000 mL | INTRAVENOUS | Status: DC | PRN

## 2016-05-04 MED ORDER — ATORVASTATIN CALCIUM 80 MG PO TABS
80.0000 mg | ORAL_TABLET | Freq: Every day | ORAL | Status: DC
  Administered 2016-05-04 – 2016-05-06 (×3): 80 mg via ORAL
  Filled 2016-05-04 (×4): qty 1

## 2016-05-04 MED ORDER — SODIUM CHLORIDE 0.9 % WEIGHT BASED INFUSION
1.0000 mL/kg/h | INTRAVENOUS | Status: DC
  Administered 2016-05-04 – 2016-05-05 (×3): 1 mL/kg/h via INTRAVENOUS

## 2016-05-04 MED ORDER — ASPIRIN 81 MG PO CHEW: 81.0000 mg | CHEWABLE_TABLET | Freq: Every day | ORAL | Status: DC

## 2016-05-04 MED ORDER — MEXILETINE HCL 150 MG PO CAPS
300.0000 mg | ORAL_CAPSULE | Freq: Two times a day (BID) | ORAL | Status: DC
  Administered 2016-05-04 – 2016-05-07 (×6): 300 mg via ORAL
  Filled 2016-05-04 (×7): qty 2

## 2016-05-04 MED ORDER — AMIODARONE HCL IN DEXTROSE 360-4.14 MG/200ML-% IV SOLN
INTRAVENOUS | Status: AC
  Filled 2016-05-04: qty 200

## 2016-05-04 MED ORDER — ATORVASTATIN CALCIUM 80 MG PO TABS: 80.0000 mg | ORAL_TABLET | Freq: Every day | ORAL | Status: DC

## 2016-05-04 MED ORDER — POTASSIUM CHLORIDE CRYS ER 20 MEQ PO TBCR
40.0000 meq | EXTENDED_RELEASE_TABLET | Freq: Once | ORAL | Status: AC
  Administered 2016-05-04: 40 meq via ORAL
  Filled 2016-05-04: qty 2

## 2016-05-04 MED ORDER — ASPIRIN EC 81 MG PO TBEC
81.0000 mg | DELAYED_RELEASE_TABLET | Freq: Every day | ORAL | Status: DC
Start: 2016-05-06 — End: 2016-05-07
  Administered 2016-05-06 – 2016-05-07 (×2): 81 mg via ORAL
  Filled 2016-05-04 (×2): qty 1

## 2016-05-04 MED ORDER — SACUBITRIL-VALSARTAN 97-103 MG PO TABS
1.0000 | ORAL_TABLET | Freq: Two times a day (BID) | ORAL | Status: DC
  Administered 2016-05-05 – 2016-05-06 (×3): 1 via ORAL
  Filled 2016-05-04 (×4): qty 1

## 2016-05-04 MED ORDER — LIDOCAINE HCL (CARDIAC) 20 MG/ML IV SOLN
100.0000 mg | Freq: Once | INTRAVENOUS | Status: AC
  Administered 2016-05-04: 100 mg via INTRAVENOUS

## 2016-05-04 MED ORDER — AMIODARONE HCL IN DEXTROSE 360-4.14 MG/200ML-% IV SOLN
60.0000 mg/h | INTRAVENOUS | Status: AC
  Administered 2016-05-04: 60 mg/h via INTRAVENOUS
  Filled 2016-05-04: qty 200

## 2016-05-04 MED ORDER — SODIUM CHLORIDE 0.9% FLUSH
3.0000 mL | Freq: Two times a day (BID) | INTRAVENOUS | Status: DC
Start: 2016-05-04 — End: 2016-05-05
  Administered 2016-05-04 – 2016-05-05 (×2): 3 mL via INTRAVENOUS

## 2016-05-04 MED ORDER — CARVEDILOL PHOSPHATE ER 80 MG PO CP24
80.0000 mg | ORAL_CAPSULE | Freq: Every day | ORAL | Status: DC
  Administered 2016-05-05 – 2016-05-07 (×3): 80 mg via ORAL
  Filled 2016-05-04 (×4): qty 1

## 2016-05-04 MED ORDER — ASPIRIN 300 MG RE SUPP: 300.0000 mg | RECTAL | Status: AC

## 2016-05-04 MED ORDER — AMIODARONE IV BOLUS ONLY 150 MG/100ML
150.0000 mg | Freq: Once | INTRAVENOUS | Status: AC
  Administered 2016-05-04: 150 mg via INTRAVENOUS

## 2016-05-04 MED ORDER — ASPIRIN 81 MG PO CHEW
81.0000 mg | CHEWABLE_TABLET | ORAL | Status: AC
  Administered 2016-05-05: 81 mg via ORAL
  Filled 2016-05-04: qty 1

## 2016-05-04 MED ORDER — NITROGLYCERIN 0.4 MG SL SUBL: 0.4000 mg | SUBLINGUAL_TABLET | SUBLINGUAL | Status: DC | PRN

## 2016-05-04 MED ORDER — HEPARIN SODIUM (PORCINE) 5000 UNIT/ML IJ SOLN
5000.0000 [IU] | Freq: Three times a day (TID) | INTRAMUSCULAR | Status: DC
  Administered 2016-05-04 – 2016-05-07 (×9): 5000 [IU] via SUBCUTANEOUS
  Filled 2016-05-04 (×9): qty 1

## 2016-05-04 NOTE — ED Notes (Signed)
Patient coughing - suctioned small amount of sputum

## 2016-05-04 NOTE — ED Notes (Signed)
NRB removed - [place] 2 liters

## 2016-05-04 NOTE — H&P (Signed)
Admit date: 05/04/2016 Referring Physician  Dr. Darl Householder Primary Physician No PCP Per Patient Primary Cardiologist  Dr. Sallyanne Kuster, Dr. Caryl Comes Reason for Consultation  VT  HPI: 55 year old with ischemic cardiomyopathy, history of sustained ventricular tachycardia, chronic total occlusion of right coronary artery with previous drug-eluting stents to the diagonal and circumflex artery approximate 10 years ago, inferolateral scar with ejection fraction of 45-50% with well compensated chronic systolic and diastolic heart failure, obstructive sleep apnea on CPAP, hyperlipidemia, erectile dysfunction with Medtronic Virtuoso defibrillator that was getting close to recommended replacement, 2.68 V with ERI of 2.63 V who recently showed no evidence of atrial fibrillation and no ventricular tachycardia who presented to the emergency room today with sustained ventricular tachycardia and was cardioverted by the emergency room to sinus rhythm.  He has not had angina pectoris. Nuclear stress test 2015 showed low risk findings. Presented to the emergency room after nondescript feeling fluttering in his chest when doing laundry. Felt a bit dizzy, yellow-like hue to his vision. Had his daughter check his pulse and it was elevated. Eventually went to the emergency room and was found to be in ventricular tachycardia, heart rate of 160 bpm. He was subsequently cardioverted in the emergency department and currently is in sinus rhythm rate 84 bpm with J-point ST elevation accentuated in the inferior leads as well as lateral precordial leads when compared to prior EKG. No complaints of chest pain. In fact he remains quite active in the gym 5 days a week.    In review of prior note from 02/19/15, Dr. Caryl Comes suggested adding wrist nausea or switching to amiodarone therapy if arrhythmia recurs.  His Medtronic device is programmed VVI.    PMH:   Past Medical History  Diagnosis Date  . CAD (coronary artery disease) 12/01/2013  .  Ischemic cardiomyopathy 12/01/2013  . Chronic combined systolic and diastolic CHF, NYHA class 1 ([place]) 12/01/2013  . Erectile dysfunction 12/01/2013  . HTN (hypertension) 12/01/2013  . Hyperlipidemia 12/01/2013    PSH:   Past Surgical History  Procedure Laterality Date  . Cardiac defibrillator placement    . Wrist tenodesis    . Achilles tendon repair      lft foot   Allergies:  Review of patient's allergies indicates no known allergies. Prior to Admit Meds:   Prior to Admission medications   Medication Sig Start Date End Date Taking? Authorizing Provider  amLODipine (NORVASC) 10 MG tablet TAKE 1 TABLET BY MOUTH DAILY Patient taking differently: TAKE 1 TABLET BY MOUTH DAILY IN EVENING 02/21/16  Yes [name] Croitoru, MD  aspirin 81 MG tablet Take 81 mg by mouth daily.   Yes Historical Provider, MD  atorvastatin (LIPITOR) 80 MG tablet TAKE 1 TABLET BY MOUTH EVERY DAY Patient taking differently: TAKE 1 TABLET BY MOUTH EVERY DAY IN EVENING 03/10/16  Yes [name] Croitoru, MD  clopidogrel (PLAVIX) 75 MG tablet TAKE 1 TABLET BY MOUTH EVERY DAY WITH BREAKFAST 08/16/15  Yes [name] Croitoru, MD  COREG CR 80 MG 24 hr capsule TAKE 1 CAPSULE BY MOUTH EVERY DAY 02/15/16  Yes [name] Croitoru, MD  hydrALAZINE (APRESOLINE) 50 MG tablet TAKE 2 TABLETS BY MOUTH TWICE DAILY. 04/22/16  Yes [name] Croitoru, MD  hydrochlorothiazide (HYDRODIURIL) 25 MG tablet TAKE 1 TABLET BY MOUTH EVERY DAY 04/08/16  Yes [name] Croitoru, MD  mexiletine (MEXITIL) 150 MG capsule TAKE 2 CAPSULES BY MOUTH TWICE DAILY 10/01/15  Yes [name], MD  potassium chloride (K-DUR,KLOR-CON) 10 MEQ tablet TAKE 1 TABLET BY  MOUTH EVERY DAY 01/22/16  Yes [name] Croitoru, MD  sacubitril-valsartan (ENTRESTO) 97-103 MG Take 1 tablet by mouth 2 (two) times daily. 01/22/16  Yes [name] Croitoru, MD  sildenafil (VIAGRA) 50 MG tablet Take 1 tablet (50 mg total) by mouth daily as needed for erectile dysfunction. 04/29/16  Yes Sanda Klein, MD   Fam HX:    Family  History  Problem Relation Age of Onset  . Heart disease Mother   . Brain cancer Father    Social HX:    Social History   Social History  . Marital Status: Single    Spouse Name: N/A  . Number of Children: N/A  . Years of Education: N/A   Occupational History  . Not on file.   Social History Main Topics  . Smoking status: Former Smoker -- 0.50 packs/day for 30 years    Types: Cigarettes    Quit date: 05/31/2013  . Smokeless tobacco: Never Used  . Alcohol Use: Yes     Comment: ocassionally  . Drug Use: Not on file  . Sexual Activity: Not on file   Other Topics Concern  . Not on file   Social History Narrative     ROS:  All 11 ROS were addressed and are negative except what is stated in the HPI   Physical Exam: Blood pressure 108/82, pulse 71, resp. rate 15, height 6\' 6"  (1.981 m), weight 280 lb (127.007 kg), SpO2 99 %.   General: Well developed, well nourished, in no acute distress Head: Eyes PERRLA, No xanthomas.   Normal cephalic and atramatic  Lungs:   Clear bilaterally to auscultation and percussion. Normal respiratory effort. No wheezes, no rales. Heart:   HRRR S1 S2 Pulses are 2+ & equal. No murmur, rubs, gallops.  No carotid bruit. No JVD.  No abdominal bruits.  Abdomen: Bowel sounds are positive, abdomen soft and non-tender without masses. No hepatosplenomegaly.Overweight Msk:  Back normal. Normal strength and tone for age. Extremities:  No clubbing, cyanosis or edema.  DP +1 Neuro: Alert and oriented X 3, non-focal, MAE x 4 GU: Deferred Rectal: Deferred Psych:  Good affect, responds appropriately      Labs: Lab Results  Component Value Date   WBC 12.2* 05/04/2016   HGB 15.2 05/04/2016   HCT 45.8 05/04/2016   MCV 94.6 05/04/2016   PLT 203 05/04/2016    No results for input(s): NA, K, CL, CO2, BUN, CREATININE, CALCIUM, PROT, BILITOT, ALKPHOS, ALT, AST, GLUCOSE in the last 168 hours.  Invalid input(s): LABALBU No results for input(s): CKTOTAL,  CKMB, TROPONINI in the last 72 hours. Lab Results  Component Value Date   CHOL 116 02/28/2014   HDL 25* 02/28/2014   LDLCALC 80 02/28/2014   TRIG 54 02/28/2014   No results found for: DDIMER   Radiology:  Dg Chest Port 1 View  05/04/2016  CLINICAL DATA:  55 year old with current history of ischemic cardiomyopathy, indwelling pacing defibrillator, combined systolic and diastolic CHF (class 1), presenting with palpitations. EXAM: PORTABLE CHEST 1 VIEW COMPARISON:  10/06/2011, 11/01/2009. FINDINGS: Left subclavian pacing defibrillator unchanged and appears intact. Cardiac silhouette mildly to moderately enlarged, unchanged. Mild chronic pulmonary venous hypertension without overt edema. Minimal linear atelectasis at the left lung base. Lungs otherwise clear. No confluent airspace consolidation. No pleural effusions. External pacing pad. IMPRESSION: Stable cardiomegaly without pulmonary edema. Minimal linear atelectasis at the left lung base. No acute cardiopulmonary disease otherwise. Electronically Signed   By: Evangeline Dakin M.D.   On: 05/04/2016  15:26   Personally viewed.  EKG:  EKG is personally reviewed as described above.   ASSESSMENT/PLAN:    55 year old with ischemic cardiomyopathy, Medtronic ICD with history of sustained ventricular tachycardia on the maxilla team therapy followed by Dr. Caryl Comes with coronary artery disease status post occluded right coronary artery and previously placed DES to circumflex and diagonal branches with ejection fraction of 45-50% here with sustained ventricular tachycardia, successfully cardioverted by emergency department and placed on amiodarone IV therapy.  Sustained ventricular tachycardia  - We will transition over to amiodarone therapy in addition to mexiletine tonight as was suggested previously by Dr. Caryl Comes. He may be able to tolerate both. If not, may wish to place on isolated amiodarone.  - Propose cardiac catheterization tomorrow to ensure that  there is no new ischemic focus.  - Ventricular tachycardia was 160 bpm, below threshold for ATP/defibrillation.  - Consult electrophysiology in the morning. I discussed the case over the phone with Dr. Curt Bears.  - Ensure potassium greater than 4, magnesium greater than 2  Coronary artery disease  - Occluded right coronary artery, chronic  - Diagonal and circumflex DES  - EF 45-50  - Nuclear stress test 2015 overall low risk  Chronic systolic heart failure  - Mildly reduced ejection fraction 45%  - Quite active however activity level had dropped roughly from 3 hours a day to 2 hours a day. Had previously been taking care of mother and brother that a been very ill.  - Goes to gym days he on a daily basis.  - NYHA class I  - Continuing with beta blocker. Will hold diuretic with mild hypotension.   Candee Furbish, MD  05/04/2016  4:06 PM

## 2016-05-04 NOTE — ED Notes (Signed)
Medtronic Tech, Claiborne Billings reported that patient had one episode of atrial arrythmmia at 1230 that lasted for 5 minutes per recorder. NO other information known to be abnormal per recorder. Setting state patient is to be shocked at 200.

## 2016-05-04 NOTE — ED Provider Notes (Signed)
CSN: [id number]     Arrival date & time 05/04/16  1453 History   First MD Initiated Contact with Patient 05/04/16 1502     Chief Complaint  Patient presents with  . Increased Heart Rate   . Shortness of Breath  . Lightheaded      (Consider location/radiation/quality/duration/timing/severity/associated sxs/prior Treatment) The history is provided by the patient.  Gregory [name] is a 55 y.o. male hx of CAD with stent, ischemic cardiomyopathy, CHF, HL, accelerated V tach with defibrillator Who presenting with palpitations. Acute onset of palpitations 30 minutes prior to arrival when he was folding his clothes. He felt lightheaded dizzy at that time but denies any chest pain. He states that his defibrillator did not go off. States that he is not on any blood thinner currently.  Past Medical History  Diagnosis Date  . CAD (coronary artery disease) 12/01/2013  . Ischemic cardiomyopathy 12/01/2013  . Chronic combined systolic and diastolic CHF, NYHA class 1 ([place]) 12/01/2013  . Erectile dysfunction 12/01/2013  . HTN (hypertension) 12/01/2013  . Hyperlipidemia 12/01/2013   Past Surgical History  Procedure Laterality Date  . Cardiac defibrillator placement    . Wrist tenodesis    . Achilles tendon repair      lft foot   Family History  Problem Relation Age of Onset  . Heart disease Mother   . Brain cancer Father    Social History  Substance Use Topics  . Smoking status: Former Smoker -- 0.50 packs/day for 30 years    Types: Cigarettes    Quit date: 05/31/2013  . Smokeless tobacco: Never Used  . Alcohol Use: Yes     Comment: ocassionally    Review of Systems  Cardiovascular: Positive for palpitations.  All other systems reviewed and are negative.     Allergies  Review of patient's allergies indicates no known allergies.  Home Medications   Prior to Admission medications   Medication Sig Start Date End Date Taking? Authorizing Provider  amLODipine (NORVASC)  10 MG tablet TAKE 1 TABLET BY MOUTH DAILY Patient taking differently: TAKE 1 TABLET BY MOUTH DAILY IN EVENING 02/21/16  Yes [name] Croitoru, MD  aspirin 81 MG tablet Take 81 mg by mouth daily.   Yes Historical Provider, MD  atorvastatin (LIPITOR) 80 MG tablet TAKE 1 TABLET BY MOUTH EVERY DAY Patient taking differently: TAKE 1 TABLET BY MOUTH EVERY DAY IN EVENING 03/10/16  Yes [name] Croitoru, MD  clopidogrel (PLAVIX) 75 MG tablet TAKE 1 TABLET BY MOUTH EVERY DAY WITH BREAKFAST 08/16/15  Yes [name] Croitoru, MD  COREG CR 80 MG 24 hr capsule TAKE 1 CAPSULE BY MOUTH EVERY DAY 02/15/16  Yes [name] Croitoru, MD  hydrALAZINE (APRESOLINE) 50 MG tablet TAKE 2 TABLETS BY MOUTH TWICE DAILY. 04/22/16  Yes [name] Croitoru, MD  hydrochlorothiazide (HYDRODIURIL) 25 MG tablet TAKE 1 TABLET BY MOUTH EVERY DAY 04/08/16  Yes [name] Croitoru, MD  mexiletine (MEXITIL) 150 MG capsule TAKE 2 CAPSULES BY MOUTH TWICE DAILY 10/01/15  Yes [name], MD  potassium chloride (K-DUR,KLOR-CON) 10 MEQ tablet TAKE 1 TABLET BY MOUTH EVERY DAY 01/22/16  Yes [name] Croitoru, MD  sacubitril-valsartan (ENTRESTO) 97-103 MG Take 1 tablet by mouth 2 (two) times daily. 01/22/16  Yes [name] Croitoru, MD  sildenafil (VIAGRA) 50 MG tablet Take 1 tablet (50 mg total) by mouth daily as needed for erectile dysfunction. 04/29/16  Yes [name] Croitoru, MD   BP 95/72 mmHg  Pulse 73  Resp 14  Ht 6\' 6"  (1.981  m)  Wt 280 lb (127.007 kg)  BMI 32.36 kg/m2  SpO2 98% Physical Exam  Constitutional: He is oriented to person, place, and time.  Ill appearing, uncomfortable   HENT:  Head: Normocephalic.  Mouth/Throat: Oropharynx is clear and moist.  Eyes: Conjunctivae are normal. Pupils are equal, round, and reactive to light.  Neck: Normal range of motion. Neck supple.  Cardiovascular: Normal heart sounds.   Tachy, regular   Pulmonary/Chest: Effort normal and breath sounds normal. No respiratory distress. He has no wheezes. He has no rales.  Abdominal: Soft. Bowel  sounds are normal. He exhibits no distension. There is no tenderness.  Musculoskeletal: Normal range of motion.  Neurological: He is alert and oriented to person, place, and time.  Skin: Skin is warm and dry.  Psychiatric: He has a normal mood and affect. His behavior is normal. Judgment and thought content normal.  Nursing note and vitals reviewed.   ED Course  .Cardioversion Date/Time: 05/04/2016 4:37 PM Performed by: Wandra Arthurs Authorized by: Wandra Arthurs Consent: Verbal consent obtained. Risks and benefits: risks, benefits and alternatives were discussed Consent given by: patient Patient understanding: patient states understanding of the procedure being performed Patient consent: the patient's understanding of the procedure matches consent given Procedure consent: procedure consent matches procedure scheduled Relevant documents: relevant documents present and verified Test results: test results available and properly labeled Patient identity confirmed: verbally with patient Time out: Immediately prior to procedure a "time out" was called to verify the correct patient, procedure, equipment, support staff and site/side marked as required. Patient sedated: yes Sedatives: etomidate Vitals: Vital signs were monitored during sedation. Cardioversion basis: emergent Pre-procedure rhythm: ventricular tachycardia Patient position: patient was placed in a supine position Chest area: chest area exposed Electrodes: pads Electrodes placed: anterior-posterior Number of attempts: 1 Attempt 1 mode: synchronous Attempt 1 waveform: biphasic Attempt 1 shock (in Joules): 200 Attempt 1 outcome: conversion to normal sinus rhythm Post-procedure rhythm: normal sinus rhythm Patient tolerance: Patient tolerated the procedure well with no immediate complications   (including critical care time)  CRITICAL CARE Performed by: Darl Householder, Aroura Vasudevan   Total critical care time: 45 minutes  Critical care time  was exclusive of separately billable procedures and treating other patients.  Critical care was necessary to treat or prevent imminent or life-threatening deterioration.  Critical care was time spent personally by me on the following activities: development of treatment plan with patient and/or surrogate as well as nursing, discussions with consultants, evaluation of patient's response to treatment, examination of patient, obtaining history from patient or surrogate, ordering and performing treatments and interventions, ordering and review of laboratory studies, ordering and review of radiographic studies, pulse oximetry and re-evaluation of patient's condition.   Labs Review Labs Reviewed  CBC WITH DIFFERENTIAL/PLATELET - Abnormal; Notable for the following:    WBC 12.2 (*)    Neutro Abs 7.8 (*)    Monocytes Absolute 1.1 (*)    All other components within normal limits  COMPREHENSIVE METABOLIC PANEL - Abnormal; Notable for the following:    CO2 20 (*)    Glucose, Bld 125 (*)    Calcium 8.8 (*)    Total Protein 6.4 (*)    Albumin 3.4 (*)    All other components within normal limits  I-STAT TROPOININ, ED    Imaging Review Dg Chest Port 1 View  05/04/2016  CLINICAL DATA:  55 year old with current history of ischemic cardiomyopathy, indwelling pacing defibrillator, combined systolic and diastolic CHF (class 1), presenting  with palpitations. EXAM: PORTABLE CHEST 1 VIEW COMPARISON:  10/06/2011, 11/01/2009. FINDINGS: Left subclavian pacing defibrillator unchanged and appears intact. Cardiac silhouette mildly to moderately enlarged, unchanged. Mild chronic pulmonary venous hypertension without overt edema. Minimal linear atelectasis at the left lung base. Lungs otherwise clear. No confluent airspace consolidation. No pleural effusions. External pacing pad. IMPRESSION: Stable cardiomegaly without pulmonary edema. Minimal linear atelectasis at the left lung base. No acute cardiopulmonary disease  otherwise. Electronically Signed   By: Evangeline Dakin M.D.   On: 05/04/2016 15:26   I have personally reviewed and evaluated these images and lab results as part of my medical decision-making.   EKG Interpretation   Date/Time:  Sunday May 04 2016 16:29:48 EDT Ventricular Rate:  67 PR Interval:  145 QRS Duration: 129 QT Interval:  418 QTC Calculation: 441 R Axis:   -21 Text Interpretation:  Sinus arrhythmia Nonspecific intraventricular  conduction delay Inferior infarct, old Probable lateral infarct, age  indeterminate Inferior STEMI resolved  Confirmed by [place]  MD, Diani Jillson ([id number])  on 05/04/2016 4:34:14 PM      MDM   Final diagnoses:  None   Saleh Metro Pickett is a 55 y.o. male here with palpitations. Initial EKG showed wide complex tachycardia. Has hx of vtach. Concerned for Vtach although SVT or afib with aberrancy could look similar. Will hold off on beta block or CCB and will give amiodarone and consult cardiology.   3:20 pm Called Dr. Harl Bowie from cardiology. Patient received amiodarone 150 mg bolus and is on the drip and given lidocaine 100 mg IV but still in Vtach. He will see patient soon.   3:50pm Still in Vtach and BP dropped to 70s. I sedated him with etomidate and cardioverted him. He is back to sinus but repeat EKG showed possible STEMI. Called Dr. Harl Bowie again, who reviewed EKG and doesn't think its a STEMI and wants a repeat.   4:36 PM Dr. Harl Bowie at bedside. Repeat EKG showed no obvious STEMI. Still on amiodarone drip. Cardiology to admit.     Wandra Arthurs, MD 05/04/16 [phone]

## 2016-05-04 NOTE — ED Notes (Signed)
Patient stated he was folding clothes and he got light headed and felt his heart racing.

## 2016-05-04 NOTE — Progress Notes (Signed)
Md notified for clarification of medications.  Will give Mexitil 300 and hold Entresto.  Also new order for CPAP at HS.  Will continue to monitor Sa[facility] Revel T

## 2016-05-05 ENCOUNTER — Encounter (HOSPITAL_COMMUNITY)
Admission: EM | Disposition: A | Discharge status: Home / Self Care | Payer: Self-pay | Source: Home / Self Care | Attending: Cardiology

## 2016-05-05 ENCOUNTER — Other Ambulatory Visit (HOSPITAL_COMMUNITY): Payer: Self-pay

## 2016-05-05 DIAGNOSIS — I472 Ventricular tachycardia: Secondary | ICD-10-CM | POA: Diagnosis present

## 2016-05-05 DIAGNOSIS — I2582 Chronic total occlusion of coronary artery: Secondary | ICD-10-CM | POA: Diagnosis present

## 2016-05-05 DIAGNOSIS — Z95810 Presence of automatic (implantable) cardiac defibrillator: Secondary | ICD-10-CM | POA: Diagnosis not present

## 2016-05-05 DIAGNOSIS — R0602 Shortness of breath: Secondary | ICD-10-CM | POA: Diagnosis present

## 2016-05-05 DIAGNOSIS — Z7902 Long term (current) use of antithrombotics/antiplatelets: Secondary | ICD-10-CM | POA: Diagnosis not present

## 2016-05-05 DIAGNOSIS — I959 Hypotension, unspecified: Secondary | ICD-10-CM | POA: Diagnosis present

## 2016-05-05 DIAGNOSIS — I110 Hypertensive heart disease with heart failure: Secondary | ICD-10-CM | POA: Diagnosis present

## 2016-05-05 DIAGNOSIS — I2510 Atherosclerotic heart disease of native coronary artery without angina pectoris: Secondary | ICD-10-CM | POA: Diagnosis present

## 2016-05-05 DIAGNOSIS — Z955 Presence of coronary angioplasty implant and graft: Secondary | ICD-10-CM | POA: Diagnosis not present

## 2016-05-05 DIAGNOSIS — I25118 Atherosclerotic heart disease of native coronary artery with other forms of angina pectoris: Secondary | ICD-10-CM

## 2016-05-05 DIAGNOSIS — E785 Hyperlipidemia, unspecified: Secondary | ICD-10-CM | POA: Diagnosis present

## 2016-05-05 DIAGNOSIS — E876 Hypokalemia: Secondary | ICD-10-CM

## 2016-05-05 DIAGNOSIS — R52 Pain, unspecified: Secondary | ICD-10-CM | POA: Diagnosis not present

## 2016-05-05 DIAGNOSIS — Z7982 Long term (current) use of aspirin: Secondary | ICD-10-CM | POA: Diagnosis not present

## 2016-05-05 DIAGNOSIS — I5042 Chronic combined systolic (congestive) and diastolic (congestive) heart failure: Secondary | ICD-10-CM | POA: Diagnosis present

## 2016-05-05 DIAGNOSIS — R609 Edema, unspecified: Secondary | ICD-10-CM | POA: Diagnosis not present

## 2016-05-05 DIAGNOSIS — G4733 Obstructive sleep apnea (adult) (pediatric): Secondary | ICD-10-CM | POA: Diagnosis present

## 2016-05-05 DIAGNOSIS — I255 Ischemic cardiomyopathy: Secondary | ICD-10-CM | POA: Insufficient documentation

## 2016-05-05 DIAGNOSIS — I272 Other secondary pulmonary hypertension: Secondary | ICD-10-CM | POA: Diagnosis present

## 2016-05-05 DIAGNOSIS — Z87891 Personal history of nicotine dependence: Secondary | ICD-10-CM | POA: Diagnosis not present

## 2016-05-05 DIAGNOSIS — I252 Old myocardial infarction: Secondary | ICD-10-CM | POA: Diagnosis not present

## 2016-05-05 HISTORY — PX: CARDIAC CATHETERIZATION: SHX172

## 2016-05-05 LAB — BASIC METABOLIC PANEL
Anion gap: 4 — ABNORMAL LOW (ref 5–15)
BUN: 9 mg/dL (ref 6–20)
CO2: 27 mmol/L (ref 22–32)
CREATININE: 1.05 mg/dL (ref 0.61–1.24)
Calcium: 8.3 mg/dL — ABNORMAL LOW (ref 8.9–10.3)
Chloride: 109 mmol/L (ref 101–111)
Glucose, Bld: 104 mg/dL — ABNORMAL HIGH (ref 65–99)
POTASSIUM: 3.3 mmol/L — AB (ref 3.5–5.1)
SODIUM: 140 mmol/L (ref 135–145)

## 2016-05-05 LAB — LIPID PANEL
CHOLESTEROL: 92 mg/dL (ref 0–200)
HDL: 22 mg/dL — ABNORMAL LOW (ref >40)
LDL Cholesterol: 59 mg/dL (ref 0–99)
TRIGLYCERIDES: 54 mg/dL (ref <150)
Total CHOL/HDL Ratio: 4.2 RATIO
VLDL: 11 mg/dL (ref 0–40)

## 2016-05-05 LAB — CBC
HEMATOCRIT: 40 % (ref 39.0–52.0)
Hemoglobin: 13.3 g/dL (ref 13.0–17.0)
MCH: 31.1 pg (ref 26.0–34.0)
MCHC: 33.3 g/dL (ref 30.0–36.0)
MCV: 93.5 fL (ref 78.0–100.0)
PLATELETS: 165 10*3/uL (ref 150–400)
RBC: 4.28 MIL/uL (ref 4.22–5.81)
RDW: 13.5 % (ref 11.5–15.5)
WBC: 8.7 10*3/uL (ref 4.0–10.5)

## 2016-05-05 LAB — TROPONIN I
TROPONIN I: 0.05 ng/mL — AB (ref <0.031)
TROPONIN I: 0.05 ng/mL — AB (ref <0.031)

## 2016-05-05 LAB — PROTIME-INR
INR: 1.22 (ref 0.00–1.49)
Prothrombin Time: 15.6 seconds — ABNORMAL HIGH (ref 11.6–15.2)

## 2016-05-05 LAB — MAGNESIUM: Magnesium: 2 mg/dL (ref 1.7–2.4)

## 2016-05-05 SURGERY — LEFT HEART CATH AND CORONARY ANGIOGRAPHY
Anesthesia: LOCAL

## 2016-05-05 MED ORDER — HEPARIN (PORCINE) IN NACL 2-0.9 UNIT/ML-% IJ SOLN
INTRAMUSCULAR | Status: DC | PRN
  Administered 2016-05-05: 1000 mL

## 2016-05-05 MED ORDER — IOPAMIDOL (ISOVUE-370) INJECTION 76%
INTRAVENOUS | Status: AC
  Filled 2016-05-05: qty 100

## 2016-05-05 MED ORDER — FENTANYL CITRATE (PF) 100 MCG/2ML IJ SOLN
INTRAMUSCULAR | Status: DC | PRN
  Administered 2016-05-05: 25 ug via INTRAVENOUS

## 2016-05-05 MED ORDER — VERAPAMIL HCL 2.5 MG/ML IV SOLN
INTRAVENOUS | Status: DC | PRN
  Administered 2016-05-05: 10 mL via INTRA_ARTERIAL

## 2016-05-05 MED ORDER — HEPARIN SODIUM (PORCINE) 1000 UNIT/ML IJ SOLN
INTRAMUSCULAR | Status: AC
  Filled 2016-05-05: qty 1

## 2016-05-05 MED ORDER — HEPARIN SODIUM (PORCINE) 1000 UNIT/ML IJ SOLN
INTRAMUSCULAR | Status: DC | PRN
  Administered 2016-05-05: 6000 [IU] via INTRAVENOUS

## 2016-05-05 MED ORDER — SODIUM CHLORIDE 0.9 % WEIGHT BASED INFUSION: 1.0000 mL/kg/h | INTRAVENOUS | Status: AC

## 2016-05-05 MED ORDER — SODIUM CHLORIDE 0.9% FLUSH
3.0000 mL | Freq: Two times a day (BID) | INTRAVENOUS | Status: DC
  Administered 2016-05-06 – 2016-05-07 (×2): 3 mL via INTRAVENOUS

## 2016-05-05 MED ORDER — LIDOCAINE HCL (PF) 1 % IJ SOLN
INTRAMUSCULAR | Status: AC
  Filled 2016-05-05: qty 30

## 2016-05-05 MED ORDER — SODIUM CHLORIDE 0.9 % IV SOLN: 250.0000 mL | INTRAVENOUS | Status: DC | PRN

## 2016-05-05 MED ORDER — MIDAZOLAM HCL 2 MG/2ML IJ SOLN
INTRAMUSCULAR | Status: DC | PRN
  Administered 2016-05-05: 1 mg via INTRAVENOUS

## 2016-05-05 MED ORDER — POTASSIUM CHLORIDE CRYS ER 20 MEQ PO TBCR
40.0000 meq | EXTENDED_RELEASE_TABLET | Freq: Two times a day (BID) | ORAL | Status: AC
  Administered 2016-05-05 (×2): 40 meq via ORAL
  Filled 2016-05-05 (×2): qty 2

## 2016-05-05 MED ORDER — FENTANYL CITRATE (PF) 100 MCG/2ML IJ SOLN
INTRAMUSCULAR | Status: AC
  Filled 2016-05-05: qty 2

## 2016-05-05 MED ORDER — HEPARIN (PORCINE) IN NACL 2-0.9 UNIT/ML-% IJ SOLN
INTRAMUSCULAR | Status: AC
  Filled 2016-05-05: qty 1000

## 2016-05-05 MED ORDER — SODIUM CHLORIDE 0.9% FLUSH: 3.0000 mL | INTRAVENOUS | Status: DC | PRN

## 2016-05-05 MED ORDER — MAGNESIUM SULFATE 2 GM/50ML IV SOLN
2.0000 g | Freq: Once | INTRAVENOUS | Status: AC
  Administered 2016-05-05: 2 g via INTRAVENOUS
  Filled 2016-05-05: qty 50

## 2016-05-05 MED ORDER — VERAPAMIL HCL 2.5 MG/ML IV SOLN
INTRAVENOUS | Status: AC
  Filled 2016-05-05: qty 2

## 2016-05-05 MED ORDER — IOPAMIDOL (ISOVUE-370) INJECTION 76%
INTRAVENOUS | Status: AC
  Filled 2016-05-05: qty 50

## 2016-05-05 MED ORDER — LIDOCAINE HCL (PF) 1 % IJ SOLN
INTRAMUSCULAR | Status: DC | PRN
  Administered 2016-05-05: 2 mL

## 2016-05-05 MED ORDER — MIDAZOLAM HCL 2 MG/2ML IJ SOLN
INTRAMUSCULAR | Status: AC
  Filled 2016-05-05: qty 2

## 2016-05-05 MED FILL — Medication: Qty: 1 | Status: AC

## 2016-05-05 SURGICAL SUPPLY — 15 items
CATH INFINITI 5 FR AR1 MOD (CATHETERS) ×2 IMPLANT
CATH INFINITI 5 FR JL3.5 (CATHETERS) ×2 IMPLANT
CATH INFINITI 5FR ANG PIGTAIL (CATHETERS) ×2 IMPLANT
CATH INFINITI JR4 5F (CATHETERS) ×2 IMPLANT
CATH LAUNCHER 5F RADR (CATHETERS) ×1 IMPLANT
CATH OPTITORQUE TIG 4.0 5F (CATHETERS) ×2 IMPLANT
CATHETER LAUNCHER 5F RADR (CATHETERS) ×2
DEVICE RAD COMP TR BAND LRG (VASCULAR PRODUCTS) ×2 IMPLANT
GLIDESHEATH SLEND A-KIT 6F 22G (SHEATH) ×2 IMPLANT
KIT HEART LEFT (KITS) ×2 IMPLANT
PACK CARDIAC CATHETERIZATION (CUSTOM PROCEDURE TRAY) ×2 IMPLANT
SYR MEDRAD MARK V 150ML (SYRINGE) ×2 IMPLANT
TRANSDUCER W/STOPCOCK (MISCELLANEOUS) ×2 IMPLANT
TUBING CIL FLEX 10 FLL-RA (TUBING) ×2 IMPLANT
WIRE SAFE-T 1.5MM-J .035X260CM (WIRE) ×2 IMPLANT

## 2016-05-05 NOTE — Interval H&P Note (Signed)
History and Physical Interval Note:  05/05/2016 2:13 PM  Gregory [name]  has presented today for surgery, with the diagnosis of VT  The various methods of treatment have been discussed with the patient and family. After consideration of risks, benefits and other options for treatment, the patient has consented to  Procedure(s): Left Heart Cath and Coronary Angiography (N/A) with possible Percutaneous Coronary Intervention as a surgical intervention .  The patient's history has been reviewed, patient examined, no change in status, stable for surgery.  I have reviewed the patient's chart and labs.  Questions were answered to the patient's satisfaction.    CAD Assessment (Coronary Angiography With or Without Left Heart Catheterization and/or Left Ventriculography)  Patient Information:    Arrhythmias   Etiology Unclear After Initial Evaluation   VF or sustained VT (?6 beats VT) with or without symptoms  AUC Score:   A (8)   Indication:   58  AUC FOR PCI Pending Angiographic Results.  Glenetta Hew

## 2016-05-05 NOTE — H&P (View-Only) (Signed)
SUBJECTIVE:  [name] is a 55 yo male with PMHx of ischemic cardiomyopathy, h/o sustained ventricular tachycardia s/p Medtronic Virtuoso defibrillator, CAD with total occlusion of RCA with h/o of DES to diagonal and circumflex arteries 10 years ago, inferolateral scar with ejection fraction of 45-50% with well compensated chronic systolic and diastolic heart failure who presented to the ED on 5/21 with complaint of palpitations.   In the ED, patient was found to have sustained wide complex ventricular tachycardia, HR 160. Despite amiodarone bolus and gtt and lidocaine 100 mg IV, patient remained in [place] and became unstable with SBPs in the 70s. Patient was subsequently sedated and cardioverted in the ED to sinus rhythm. Post-cardioversion EKG showed J point ST elevation in the inferior and lateral leads. Overnight, patient did well and remained in sinus rhythm.  This morning, patient denies any complaints. He denies any recurrent palpitations, chest pain, shortness of breath, lightheadedness, nausea or vomiting. He feels like his normal self.   OBJECTIVE:   Vitals:   Filed Vitals:   05/05/16 0900 05/05/16 1000 05/05/16 1100 05/05/16 1123  BP:    118/88  Pulse:      Temp:    97.5 F (36.4 C)  TempSrc:    Oral  Resp: 17 18 9    Height:      Weight:      SpO2: 98% 95% 96%    I&O's:    Intake/Output Summary (Last 24 hours) at 05/05/16 1123 Last data filed at 05/05/16 1100  Gross per 24 hour  Intake 5491.72 ml  Output   1925 ml  Net 3566.72 ml   TELEMETRY: Reviewed telemetry pt in sinus rhythm since cardioversion  PHYSICAL EXAM General: Vital signs reviewed.  Patient is well-developed and well-nourished, in no acute distress and cooperative with exam.  Eyes: Conjunctivae normal, no scleral icterus, bilateral growths in lateral corners of eyes bilaterally.  Neck: Supple, trachea midline, no JVD, or carotid bruit present.  Cardiovascular: RRR, S1 normal, S2 normal, no murmurs,  gallops, or rubs. Pulmonary/Chest: Clear to auscultation bilaterally, no wheezes, rales, or rhonchi. Abdominal: Soft, non-tender, non-distended, BS + Extremities: No lower extremity edema bilaterally, pulses symmetric and intact bilaterally.  Skin: Warm, dry and intact.  Psychiatric: Normal mood and affect. speech and behavior is normal. Cognition and memory are grossly normal.   LABS: Basic Metabolic Panel:  Recent Labs  05/04/16 1530 05/04/16 2357 05/05/16 0526  NA 136  --  140  K 3.6  --  3.3*  CL 107  --  109  CO2 20*  --  27  GLUCOSE 125*  --  104*  BUN 13  --  9  CREATININE 1.23  --  1.05  CALCIUM 8.8*  --  8.3*  MG  --  2.0  --    Liver Function Tests:  Recent Labs  05/04/16 1530  AST 21  ALT 41  ALKPHOS 85  BILITOT 0.4  PROT 6.4*  ALBUMIN 3.4*   CBC:  Recent Labs  05/04/16 1530 05/05/16 0526  WBC 12.2* 8.7  NEUTROABS 7.8*  --   HGB 15.2 13.3  HCT 45.8 40.0  MCV 94.6 93.5  PLT 203 165   Cardiac Enzymes:  Recent Labs  05/04/16 1822 05/04/16 2357 05/05/16 0526  TROPONINI 0.03 0.05* 0.05*   Coag Panel:   Lab Results  Component Value Date   INR 1.12 10/30/2009   ECHO 02/16/13: Study Conclusions  - Left ventricle: The cavity size was moderately dilated. There was  moderate concentric hypertrophy. Systolic function was mildly reduced. The estimated ejection fraction was in the range of 45% to 50%. Moderate hypokinesis of the inferior myocardium. Mild hypokinesis of the lateral myocardium. Doppler parameters are consistent with abnormal left ventricular relaxation (grade 1 diastolic dysfunction). - Mitral valve: Mild regurgitation. Valve area by pressure half-time: 2.32cm^2. - Left atrium: The appendage was moderately dilated. - Right ventricle: The cavity size was mildly dilated. - Right atrium: The atrium was mildly dilated. - Atrial septum: No defect or patent foramen ovale was identified. Impressions:  - No  signifidfant intra-ventricular dyssynchrony.  Myoview 03/2014:  Low risk stress nuclear study with a large and severe scar in the right coronary artery territory  RADIOLOGY: Dg Chest Port 1 View  05/04/2016  CLINICAL DATA:  55 year old with current history of ischemic cardiomyopathy, indwelling pacing defibrillator, combined systolic and diastolic CHF (class 1), presenting with palpitations. EXAM: PORTABLE CHEST 1 VIEW COMPARISON:  10/06/2011, 11/01/2009. FINDINGS: Left subclavian pacing defibrillator unchanged and appears intact. Cardiac silhouette mildly to moderately enlarged, unchanged. Mild chronic pulmonary venous hypertension without overt edema. Minimal linear atelectasis at the left lung base. Lungs otherwise clear. No confluent airspace consolidation. No pleural effusions. External pacing pad. IMPRESSION: Stable cardiomegaly without pulmonary edema. Minimal linear atelectasis at the left lung base. No acute cardiopulmonary disease otherwise. Electronically Signed   By: Evangeline Dakin M.D.   On: 05/04/2016 15:26   ASSESSMENT/PLAN:   [name] is a 55 yo male with PMHx of ischemic cardiomyopathy, h/o sustained ventricular tachycardia s/p Medtronic Virtuoso defibrillator, CAD with total occlusion of RCA with h/o of DES to diagonal and circumflex arteries and chronic systolic and diastolic heart failure who presented to the ED on 5/21 with complaint of palpitations and was found to be in sustained ventricular tachycardia. Patient is now s/p cardioversion to sinus rhythm.   Sustained Ventricular Tachycardia: H/o of VTach with defibrillator in place. Patient presented with Vtach at 160 bpm without firing of his defibrillator as this was below threshold for ATP/defibrillation. Patient is s/p cardioversion to sinus rhythm. He is on mexilitine 300 mg BID and Coreg 80 mg daily at home. Patient is currently on amiodarone gtt 30 mg/hr, mexiletine 300 mg BID, and carvedilol 80 mg daily with good control.  BP 100s/70s and HR 50-60s. Plans for cardiac cath today at 1500 to ensure that there is no new ischemic focus. -Continue amiodarone 30 mg/hr gtt -Continue Coreg 80 mg daily -Continue mexiletine 300 mg BID -Consulted electrophysiology, case was discussed over the phone with Dr. Curt Bears yesterday -Keep potassium greater than 4, magnesium greater than 2  CAD: Patient has history of an occluded right coronary artery, chronic, and s/p diagonal and circumflex DES. Nuclear stress test 2015 overall low risk. Plans for cardiac cath today to evaluate for ischemic focus. Patient is on ASA 81 mg daily, atorvastatin and Plavix 75 mg daily at home.  -Continue ASA 81 mg daily -Continue atorvastatin 80 mg daily -Continue Coreg 80 mg daily -Holding Plavix  -Holding Entresto   Chronic HFrEF: NYHA Class I. Echo in 2014 showed mildly reduced ejection fraction 45%. Patient is on Entresto, Coreg, hydralazine, amlodipine and HCTZ at home.  -Continue carvedilol 80 mg daily -Holding HCTZ, amlodipine, hydralazine and Entresto due to soft BPs  -Repeat Echo  Hypokalemia: K 3.3 this morning.  -Will keep K > 4.0 -Kdur 40 mEq x 2 doses -Replace Mag -Repeat BMET tomorrow am  HLD: Total cholesterol 92, TG 54, HDL 22, LDL 59. Patient is  on atorvastatin 80 mg daily at home.  -Continue atorvastatin 80 mg daily  HTN: 100s/60-70s overnight. Patient is on amlodipine 10 mg daily, Coreg 80 mg daily, hydralazine 100 mg BID, HCTZ 25 mg daily and sacubitril-valsartan 97-103 BID at home.  -Continue Coreg 80 mg daily -Holding hydralazine, Entresto, HCTZ, and amlodipine  OSA:  -CPAP QHS  DVT/PE ppx: Heparin TID FEN: Clear liquid diet, NPO at 1000 for cardiac cath   Martyn Malay, DO PGY-2 Internal Medicine Resident Pager # [phone] 05/05/2016 11:23 AM  The patient has been seen in conjunction with [place], D.O. All aspects of care have been considered and discussed. The patient has been personally interviewed,  examined, and all clinical data has been reviewed.   The patient has a history of ischemic heart disease with prior myocardial infarction. 12-lead EKG reveals persistent inferolateral ST elevation possibly consistent with an aneurysm in that segment.  He is currently on IV amiodarone along with his chronic Mexitil for arrhythmia suppression. No significant enzyme elevation is noted.  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 Coronary angiography will exclude superimposed new disease that could be causing ischemia.  We need to have EP involved. Help manage the arrhythmia going forward.

## 2016-05-05 NOTE — Progress Notes (Signed)
SUBJECTIVE:  Gregory [name] is a 55 yo male with PMHx of ischemic cardiomyopathy, h/o sustained ventricular tachycardia s/p Medtronic Virtuoso defibrillator, CAD with total occlusion of RCA with h/o of DES to diagonal and circumflex arteries 10 years ago, inferolateral scar with ejection fraction of 45-50% with well compensated chronic systolic and diastolic heart failure who presented to the ED on 5/21 with complaint of palpitations.   In the ED, patient was found to have sustained wide complex ventricular tachycardia, HR 160. Despite amiodarone bolus and gtt and lidocaine 100 mg IV, patient remained in [place] and became unstable with SBPs in the 70s. Patient was subsequently sedated and cardioverted in the ED to sinus rhythm. Post-cardioversion EKG showed J point ST elevation in the inferior and lateral leads. Overnight, patient did well and remained in sinus rhythm.  This morning, patient denies any complaints. He denies any recurrent palpitations, chest pain, shortness of breath, lightheadedness, nausea or vomiting. He feels like his normal self.   OBJECTIVE:   Vitals:   Filed Vitals:   05/05/16 0900 05/05/16 1000 05/05/16 1100 05/05/16 1123  BP:    118/88  Pulse:      Temp:    97.5 F (36.4 C)  TempSrc:    Oral  Resp: 17 18 9    Height:      Weight:      SpO2: 98% 95% 96%    I&O's:    Intake/Output Summary (Last 24 hours) at 05/05/16 1123 Last data filed at 05/05/16 1100  Gross per 24 hour  Intake 5491.72 ml  Output   1925 ml  Net 3566.72 ml   TELEMETRY: Reviewed telemetry pt in sinus rhythm since cardioversion  PHYSICAL EXAM General: Vital signs reviewed.  Patient is well-developed and well-nourished, in no acute distress and cooperative with exam.  Eyes: Conjunctivae normal, no scleral icterus, bilateral growths in lateral corners of eyes bilaterally.  Neck: Supple, trachea midline, no JVD, or carotid bruit present.  Cardiovascular: RRR, S1 normal, S2 normal, no murmurs,  gallops, or rubs. Pulmonary/Chest: Clear to auscultation bilaterally, no wheezes, rales, or rhonchi. Abdominal: Soft, non-tender, non-distended, BS + Extremities: No lower extremity edema bilaterally, pulses symmetric and intact bilaterally.  Skin: Warm, dry and intact.  Psychiatric: Normal mood and affect. speech and behavior is normal. Cognition and memory are grossly normal.   LABS: Basic Metabolic Panel:  Recent Labs  05/04/16 1530 05/04/16 2357 05/05/16 0526  NA 136  --  140  K 3.6  --  3.3*  CL 107  --  109  CO2 20*  --  27  GLUCOSE 125*  --  104*  BUN 13  --  9  CREATININE 1.23  --  1.05  CALCIUM 8.8*  --  8.3*  MG  --  2.0  --    Liver Function Tests:  Recent Labs  05/04/16 1530  AST 21  ALT 41  ALKPHOS 85  BILITOT 0.4  PROT 6.4*  ALBUMIN 3.4*   CBC:  Recent Labs  05/04/16 1530 05/05/16 0526  WBC 12.2* 8.7  NEUTROABS 7.8*  --   HGB 15.2 13.3  HCT 45.8 40.0  MCV 94.6 93.5  PLT 203 165   Cardiac Enzymes:  Recent Labs  05/04/16 1822 05/04/16 2357 05/05/16 0526  TROPONINI 0.03 0.05* 0.05*   Coag Panel:   Lab Results  Component Value Date   INR 1.12 10/30/2009   ECHO 02/16/13: Study Conclusions  - Left ventricle: The cavity size was moderately dilated. There was  moderate concentric hypertrophy. Systolic function was mildly reduced. The estimated ejection fraction was in the range of 45% to 50%. Moderate hypokinesis of the inferior myocardium. Mild hypokinesis of the lateral myocardium. Doppler parameters are consistent with abnormal left ventricular relaxation (grade 1 diastolic dysfunction). - Mitral valve: Mild regurgitation. Valve area by pressure half-time: 2.32cm^2. - Left atrium: The appendage was moderately dilated. - Right ventricle: The cavity size was mildly dilated. - Right atrium: The atrium was mildly dilated. - Atrial septum: No defect or patent foramen ovale was identified. Impressions:  - No  signifidfant intra-ventricular dyssynchrony.  Myoview 03/2014:  Low risk stress nuclear study with a large and severe scar in the right coronary artery territory  RADIOLOGY: Dg Chest Port 1 View  05/04/2016  CLINICAL DATA:  55 year old with current history of ischemic cardiomyopathy, indwelling pacing defibrillator, combined systolic and diastolic CHF (class 1), presenting with palpitations. EXAM: PORTABLE CHEST 1 VIEW COMPARISON:  10/06/2011, 11/01/2009. FINDINGS: Left subclavian pacing defibrillator unchanged and appears intact. Cardiac silhouette mildly to moderately enlarged, unchanged. Mild chronic pulmonary venous hypertension without overt edema. Minimal linear atelectasis at the left lung base. Lungs otherwise clear. No confluent airspace consolidation. No pleural effusions. External pacing pad. IMPRESSION: Stable cardiomegaly without pulmonary edema. Minimal linear atelectasis at the left lung base. No acute cardiopulmonary disease otherwise. Electronically Signed   By: Evangeline Dakin M.D.   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 Patient is now s/p cardioversion to sinus rhythm.   Sustained Ventricular Tachycardia: H/o of VTach with defibrillator in place. Patient presented with Vtach at 160 bpm without firing of his defibrillator as this was below threshold for ATP/defibrillation. Patient is s/p cardioversion to sinus rhythm. He is on mexilitine 300 mg BID and Coreg 80 mg daily at home. Patient is currently on amiodarone gtt 30 mg/hr, mexiletine 300 mg BID, and carvedilol 80 mg daily with good control.  BP 100s/70s and HR 50-60s. Plans for cardiac cath today at 1500 to ensure that there is no new ischemic focus. -Continue amiodarone 30 mg/hr gtt -Continue Coreg 80 mg daily -Continue mexiletine 300 mg BID -Consulted electrophysiology, case was discussed over the phone with Dr. Curt Bears yesterday -Keep potassium greater than 4, magnesium greater than 2  CAD: Patient has history of an occluded right coronary artery, chronic, and s/p diagonal and circumflex DES. Nuclear stress test 2015 overall low risk. Plans for cardiac cath today to evaluate for ischemic focus. Patient is on ASA 81 mg daily, atorvastatin and Plavix 75 mg daily at home.  -Continue ASA 81 mg daily -Continue atorvastatin 80 mg daily -Continue Coreg 80 mg daily -Holding Plavix  -Holding Entresto   Chronic HFrEF: NYHA Class I. Echo in 2014 showed mildly reduced ejection fraction 45%. Patient is on Entresto, Coreg, hydralazine, amlodipine and HCTZ at home.  -Continue carvedilol 80 mg daily -Holding HCTZ, amlodipine, hydralazine and Entresto due to soft BPs  -Repeat Echo  Hypokalemia: K 3.3 this morning.  -Will keep K > 4.0 -Kdur 40 mEq x 2 doses -Replace Mag -Repeat BMET tomorrow am  HLD: Total cholesterol 92, TG 54, HDL 22, LDL 59. Patient is  on atorvastatin 80 mg daily at home.  -Continue atorvastatin 80 mg daily  HTN: 100s/60-70s overnight. Patient is on amlodipine 10 mg daily, Coreg 80 mg daily, hydralazine 100 mg BID, HCTZ 25 mg daily and sacubitril-valsartan 97-103 BID at home.  -Continue Coreg 80 mg daily -Holding hydralazine, Entresto, HCTZ, and amlodipine  OSA:  -CPAP QHS  DVT/PE ppx: Heparin TID FEN: Clear liquid diet, NPO at 1000 for cardiac cath   Martyn Malay, DO PGY-2 Internal Medicine Resident Pager # [phone] 05/05/2016 11:23 AM  The patient has been seen in conjunction with [place], D.O. All aspects of care have been considered and discussed. The patient has been personally interviewed,  examined, and all clinical data has been reviewed.   The patient has a history of ischemic heart disease with prior myocardial infarction. 12-lead EKG reveals persistent inferolateral ST elevation possibly consistent with an aneurysm in that segment.  He is currently on IV amiodarone along with his chronic Mexitil for arrhythmia suppression. No significant enzyme elevation is noted.  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 Coronary angiography will exclude superimposed new disease that could be causing ischemia.  We need to have EP involved. Help manage the arrhythmia going forward.

## 2016-05-05 NOTE — Progress Notes (Signed)
Pt. States he will notify if he decides to wear cpap tonight.

## 2016-05-05 NOTE — Consult Note (Signed)
ELECTROPHYSIOLOGY CONSULT NOTE    Patient ID: Gregory [name] MRN: [id number], DOB/AGE: [birth year] 55 y.o.  Admit date: 05/04/2016 Date of Consult: 05/05/2016  Primary Physician: No primary care provider on file. Primary Cardiologist: Dr. Sallyanne Kuster Electrophysiologist: Dr. Caryl Comes Requesting MD: Dr. Tamala Julian  Reason for Consultation: VT  HPI: Gregory [name] is a 55 y.o. male came to [facility] ER yesterday with some visual changes, he checked his pulse and had a hard time feeling at states felt like it was irregular or was missing beats (by the pulse), he denies any palpitations or fluttering type symptoms, mentioning this to his daughter put her fit bit on him and HR showed in the 130's, and he came in at his family's insistance.  He was noted to be in VT and DCCV in the ER to SR.  He has not missed any doses of any of his medicines, and reports he has been feeling well.  His PMHx  Includes: VT with ICD, CAD with chronic total occlusion of right coronary artery with previous drug-eluting stents to the diagonal and circumflex artery approximate 10 years ago, inferolateral scar with ejection fraction of 45-50% with well compensated chronic systolic and diastolic heart failure, obstructive sleep apnea on CPAP, hyperlipidemia, erectile dysfunction.  Admit labs: K+ 3/6 Mag 2.0 Trop 0.03, 0.05 x2 H/H 13.3/40.0  VT/ADD history: Mexiletine He has had ATP tx for VT by his device (noted by Dr. Olin Pia note March 2016 to continue mexitil and high dose BB and his device was reprogrammed tomore ATP for VT he 200-240 beats per minute) March 2015 appropriately shocked x2 for VT    Past Medical History  Diagnosis Date  . CAD (coronary artery disease) 12/01/2013  . Ischemic cardiomyopathy 12/01/2013  . Chronic combined systolic and diastolic CHF, NYHA class 1 ([place]) 12/01/2013  . Erectile dysfunction 12/01/2013  . HTN (hypertension) 12/01/2013  . Hyperlipidemia 12/01/2013  . Sleep  apnea      Surgical History:  Past Surgical History  Procedure Laterality Date  . Cardiac defibrillator placement    . Wrist tenodesis    . Achilles tendon repair      lft foot     Prescriptions prior to admission  Medication Sig Dispense Refill Last Dose  . amLODipine (NORVASC) 10 MG tablet TAKE 1 TABLET BY MOUTH DAILY (Patient taking differently: TAKE 1 TABLET BY MOUTH DAILY IN EVENING) 30 tablet 10 05/04/2016 at Unknown time  . aspirin 81 MG tablet Take 81 mg by mouth daily.   05/04/2016 at Unknown time  . atorvastatin (LIPITOR) 80 MG tablet TAKE 1 TABLET BY MOUTH EVERY DAY (Patient taking differently: TAKE 1 TABLET BY MOUTH EVERY DAY IN EVENING) 90 tablet 3 05/03/2016 at Unknown time  . clopidogrel (PLAVIX) 75 MG tablet TAKE 1 TABLET BY MOUTH EVERY DAY WITH BREAKFAST 90 tablet 3 05/04/2016 at Unknown time  . COREG CR 80 MG 24 hr capsule TAKE 1 CAPSULE BY MOUTH EVERY DAY 30 capsule 6 05/04/2016 at 1200  . hydrALAZINE (APRESOLINE) 50 MG tablet TAKE 2 TABLETS BY MOUTH TWICE DAILY. 360 tablet 0 05/04/2016 at AM  . hydrochlorothiazide (HYDRODIURIL) 25 MG tablet TAKE 1 TABLET BY MOUTH EVERY DAY 90 tablet 2 05/04/2016 at Unknown time  . mexiletine (MEXITIL) 150 MG capsule TAKE 2 CAPSULES BY MOUTH TWICE DAILY 120 capsule 6 05/04/2016 at AM  . potassium chloride (K-DUR,KLOR-CON) 10 MEQ tablet TAKE 1 TABLET BY MOUTH EVERY DAY 90 tablet 3 05/04/2016 at Unknown time  . sacubitril-valsartan (ENTRESTO)  97-103 MG Take 1 tablet by mouth 2 (two) times daily. 60 tablet 3 05/04/2016 at AM  . sildenafil (VIAGRA) 50 MG tablet Take 1 tablet (50 mg total) by mouth daily as needed for erectile dysfunction. 30 tablet 0 Past Month at Unknown time    Inpatient Medications:  . [START ON 05/06/2016] aspirin EC  81 mg Oral Daily  . atorvastatin  80 mg Oral q1800  . carvedilol  80 mg Oral Daily  . heparin  5,000 Units Subcutaneous Q8H  . mexiletine  300 mg Oral BID  . potassium chloride  40 mEq Oral BID  .  sacubitril-valsartan  1 tablet Oral BID  . sodium chloride flush  3 mL Intravenous Q12H    Allergies: No Known Allergies  Social History   Social History  . Marital Status: Single    Spouse Name: N/A  . Number of Children: N/A  . Years of Education: N/A   Occupational History  . Not on file.   Social History Main Topics  . Smoking status: Former Smoker -- 0.50 packs/day for 30 years    Types: Cigarettes    Quit date: 05/31/2013  . Smokeless tobacco: Never Used  . Alcohol Use: No     Comment: ocassionally  . Drug Use: Not on file  . Sexual Activity: Not on file   Other Topics Concern  . Not on file   Social History Narrative     Family History  Problem Relation Age of Onset  . Heart disease Mother   . Brain cancer Father      Review of Systems: All other systems reviewed and are otherwise negative except as noted above.  Physical Exam: Filed Vitals:   05/05/16 0900 05/05/16 1000 05/05/16 1100 05/05/16 1123  BP:    118/88  Pulse:      Temp:    97.5 F (36.4 C)  TempSrc:    Oral  Resp: 17 18 12 18   Height:      Weight:      SpO2: 98% 95% 96%      GEN- The patient is well appearing, obese, alert and oriented x 3 today.   HEENT: normocephalic, atraumatic; sclera clear, conjunctiva pink; hearing intact; oropharynx clear; neck supple, no JVP,  Lymph- no cervical lymphadenopathy Lungs- Clear to ausculation bilaterally, normal work of breathing.  No wheezes, rales, rhonchi Heart- Regular rate and rhythm, no significant murmurs, rubs or gallops, PMI not laterally displaced GI- soft, non-tender, non-distended, bowel sounds present Extremities- no clubbing, cyanosis, or edema; DP/PT/radial pulses 2+ bilaterally MS- no significant deformity or atrophy Skin- warm and dry, no rash or lesion Psych- euthymic mood, full affect Neuro- no gross deficits observed  Labs:   Lab Results  Component Value Date   WBC 8.7 05/05/2016   HGB 13.3 05/05/2016   HCT 40.0  05/05/2016   MCV 93.5 05/05/2016   PLT 165 05/05/2016    Recent Labs Lab 05/04/16 1530 05/05/16 0526  NA 136 140  K 3.6 3.3*  CL 107 109  CO2 20* 27  BUN 13 9  CREATININE 1.23 1.05  CALCIUM 8.8* 8.3*  PROT 6.4*  --   BILITOT 0.4  --   ALKPHOS 85  --   ALT 41  --   AST 21  --   GLUCOSE 125* 104*      Radiology/Studies:  Dg Chest Port 1 View 05/04/2016  CLINICAL DATA:  55 year old with current history of ischemic cardiomyopathy, indwelling pacing defibrillator, combined systolic and  diastolic CHF (class 1), presenting with palpitations. EXAM: PORTABLE CHEST 1 VIEW COMPARISON:  10/06/2011, 11/01/2009. FINDINGS: Left subclavian pacing defibrillator unchanged and appears intact. Cardiac silhouette mildly to moderately enlarged, unchanged. Mild chronic pulmonary venous hypertension without overt edema. Minimal linear atelectasis at the left lung base. Lungs otherwise clear. No confluent airspace consolidation. No pleural effusions. External pacing pad. IMPRESSION: Stable cardiomegaly without pulmonary edema. Minimal linear atelectasis at the left lung base. No acute cardiopulmonary disease otherwise. Electronically Signed   By: Evangeline Dakin M.D.   On: 05/04/2016 15:26    EKG: WCT, RBBB morphology            SR, large Q waves II, III, avf, lat t changes TELEMETRY: SR Echo this admit is pending 02/16/13: Echocardiogram Study Conclusions - Left ventricle: The cavity size was moderately dilated. There was moderate concentric hypertrophy. Systolic function was mildly reduced. The estimated ejection fraction was in the range of 45% to 50%. Moderate hypokinesis of the inferior myocardium. Mild hypokinesis of the lateral myocardium. Doppler parameters are consistent with abnormal left ventricular relaxation (grade 1 diastolic dysfunction). - Mitral valve: Mild regurgitation. Valve area by pressure half-time: 2.32cm^2. - Left atrium: The appendage was moderately  dilated. - Right ventricle: The cavity size was mildly dilated. - Right atrium: The atrium was mildly dilated. - Atrial septum: No defect or patent foramen ovale was identified. Impressions: - No signifidfant intra-ventricular dyssynchrony.  Myoview 2010 extensive inferolateral scar  DEVICE HISTORY: MDT dual chamber ICD gen change and RV lead replacement 10/31/09, Dr. Caryl Comes (original device 2005 with sprint fidelis lead)  Assessment and Plan:  1. WCT/VT 160bpm     Neg Trop     On amiodarone gtt with his mexiletine     Abnormal EKG,s no CP, planned for cardiac cath today     No reports of CP, and actively has been exercising without changes to his exertional capacity, though an ill parent has decreased how much he has been able to do more recently     Await cath results     Device checked, functioning as programmed with VT zone 200-240bpm, battery is nearing ERI, VT zone has been turned on at 159bpm with lots of ATP and no shock untile the rate accelerates to 180.  2. HTN     Stable  3. ICM     Appears compensated by exam. He has had no angina. Await cardiac cath report  5. hypokalemia     Being addressed by primary team       Signed, Tommye Standard, PA-C  EP Attending  Patient seen and examined. Agree with above. The patient has presented with VT at 160/min and required DCCV in ER. He felt poorly in VT but no hemodynamic compromise. I have reviewed the findings as documented by Tommye Standard, PA-C.  Exam reveals a RRR and lungs are clear. Ext with a trace edema. ECG - NSR; old ECG's demonstrated VT at 160/min. Agree with plan for heart cath. Also agree with amiodarone for now. Will plan to transition to oral amio. If he has more VT, then ablation would be a consideration. He will undergo revascularization if CAD has worsened. His device has been reprogrammed to provide ATP if needed for recurrent slower VT.  Gregg Taylor,M.D. 05/05/2016 11:53 AM

## 2016-05-06 ENCOUNTER — Telehealth: Payer: Self-pay | Admitting: Cardiology

## 2016-05-06 ENCOUNTER — Encounter: Payer: Self-pay | Admitting: *Deleted

## 2016-05-06 ENCOUNTER — Inpatient Hospital Stay (HOSPITAL_COMMUNITY): Payer: Worker's Compensation

## 2016-05-06 ENCOUNTER — Encounter (HOSPITAL_COMMUNITY): Payer: Self-pay | Admitting: Cardiology

## 2016-05-06 DIAGNOSIS — I255 Ischemic cardiomyopathy: Secondary | ICD-10-CM

## 2016-05-06 DIAGNOSIS — I2510 Atherosclerotic heart disease of native coronary artery without angina pectoris: Secondary | ICD-10-CM

## 2016-05-06 LAB — BASIC METABOLIC PANEL
ANION GAP: 5 (ref 5–15)
BUN: 8 mg/dL (ref 6–20)
CALCIUM: 8.3 mg/dL — AB (ref 8.9–10.3)
CO2: 26 mmol/L (ref 22–32)
CREATININE: 0.86 mg/dL (ref 0.61–1.24)
Chloride: 107 mmol/L (ref 101–111)
Glucose, Bld: 108 mg/dL — ABNORMAL HIGH (ref 65–99)
Potassium: 3.9 mmol/L (ref 3.5–5.1)
SODIUM: 138 mmol/L (ref 135–145)

## 2016-05-06 LAB — ECHOCARDIOGRAM COMPLETE
HEIGHTINCHES: 78 in
WEIGHTICAEL: 4547.2 [oz_av]

## 2016-05-06 MED ORDER — CLOPIDOGREL BISULFATE 75 MG PO TABS
75.0000 mg | ORAL_TABLET | Freq: Once | ORAL | Status: AC
  Administered 2016-05-06: 75 mg via ORAL
  Filled 2016-05-06: qty 1

## 2016-05-06 MED ORDER — AMIODARONE HCL 200 MG PO TABS
400.0000 mg | ORAL_TABLET | Freq: Two times a day (BID) | ORAL | Status: DC
Start: 2016-05-06 — End: 2016-05-07
  Administered 2016-05-06 (×2): 400 mg via ORAL
  Filled 2016-05-06 (×2): qty 2

## 2016-05-06 MED ORDER — CLOPIDOGREL BISULFATE 75 MG PO TABS
75.0000 mg | ORAL_TABLET | Freq: Every day | ORAL | Status: DC
  Administered 2016-05-07: 75 mg via ORAL
  Filled 2016-05-06: qty 1

## 2016-05-06 MED ORDER — AMIODARONE HCL 200 MG PO TABS: 400.0000 mg | ORAL_TABLET | Freq: Every day | ORAL | Status: DC

## 2016-05-06 MED ORDER — SACUBITRIL-VALSARTAN 97-103 MG PO TABS
1.0000 | ORAL_TABLET | Freq: Two times a day (BID) | ORAL | Status: DC
  Administered 2016-05-06 – 2016-05-07 (×2): 1 via ORAL
  Filled 2016-05-06 (×3): qty 1

## 2016-05-06 NOTE — Progress Notes (Signed)
Notified Cecilie Kicks NP that plavix has not been ordered daily, but did receive today. States she will address. Cont to monitor. Carroll Kinds RN

## 2016-05-06 NOTE — Progress Notes (Signed)
Pt. States he can place cpap on himself.

## 2016-05-06 NOTE — Progress Notes (Signed)
Report called to [facility] RN on 3east. Pt aware of transfer to room 3E20. Carroll Kinds RN

## 2016-05-06 NOTE — Progress Notes (Signed)
*  PRELIMINARY RESULTS* Echocardiogram 2D Echocardiogram has been performed.  Leavy Cella 05/06/2016, 10:07 AM

## 2016-05-06 NOTE — Progress Notes (Addendum)
SUBJECTIVE:  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   Cardiac cath showed Ost RCA to Dist RCA lesion, 100% stenosed. Known occluded, post Atrio lesion, 80% stenosed. Lat 1st Mrg lesion, 100% stenosed. Widely patent stent in OM1, D1 and mid LAD. There is mild to moderate left ventricular systolic dysfunction. Severely elevated LVEDP. No new potential culprit lesion visualized to explain the patient's presentation with ventricular tachycardiac.   Patient was seen and examined this morning. He feels well, no complaints of chest pain, shortness of breath, palpitations, nausea, or lightheadedness.   OBJECTIVE:   Vitals:   Filed Vitals:   05/06/16 0736 05/06/16 0800 05/06/16 0900 05/06/16 1000  BP: 116/75     Pulse: 50 61 60 66  Temp: 98.1 F (36.7 C)     TempSrc: Oral     Resp: 14 13 20 20   Height:      Weight:      SpO2: 96% 97% 93% 94%   I&O's:    Intake/Output Summary (Last 24 hours) at 05/06/16 1056 Last data filed at 05/06/16 1000  Gross per 24 hour  Intake 2339.95 ml  Output   3200 ml  Net -860.05 ml   TELEMETRY: Reviewed telemetry pt in sinus rhythm. 12 beat run of WCT overnight.  PHYSICAL EXAM General: Vital signs reviewed.  Patient is well-developed and well-nourished, in no acute distress and cooperative with exam.  Neck: Supple, trachea midline, no JVD.  Cardiovascular: Bradycardic, regular rhythm, S1 normal, S2 normal, no murmurs, gallops, or rubs. Pulmonary/Chest: Clear to auscultation bilaterally, no wheezes, rales, or  rhonchi. Abdominal: Soft, non-tender, non-distended, BS + Extremities: No lower extremity edema bilaterally, pulses symmetric and intact bilaterally.   LABS: Basic Metabolic Panel:  Recent Labs  05/04/16 2357 05/05/16 0526 05/06/16 0634  NA  --  140 138  K  --  3.3* 3.9  CL  --  109 107  CO2  --  27 26  GLUCOSE  --  104* 108*  BUN  --  9 8  CREATININE  --  1.05 0.86  CALCIUM  --  8.3* 8.3*  MG 2.0  --   --    Liver Function Tests:  Recent Labs  05/04/16 1530  AST 21  ALT 41  ALKPHOS 85  BILITOT 0.4  PROT 6.4*  ALBUMIN 3.4*   CBC:  Recent Labs  05/04/16 1530 05/05/16 0526  WBC 12.2* 8.7  NEUTROABS 7.8*  --   HGB 15.2 13.3  HCT 45.8 40.0  MCV 94.6 93.5  PLT 203 165   Cardiac Enzymes:  Recent Labs  05/04/16 1822 05/04/16 2357 05/05/16 0526  TROPONINI 0.03 0.05* 0.05*   Coag Panel:   Lab Results  Component Value Date   INR 1.22 05/05/2016   INR 1.12 10/30/2009   ECHO 02/16/13: Study Conclusions  - Left ventricle: The cavity size was moderately dilated. There was moderate concentric hypertrophy. Systolic function was mildly reduced. The estimated ejection fraction was in the range of 45% to 50%. Moderate hypokinesis of the inferior myocardium. Mild hypokinesis of the lateral myocardium. Doppler parameters are consistent with  abnormal left ventricular relaxation (grade 1 diastolic dysfunction). - Mitral valve: Mild regurgitation. Valve area by pressure half-time: 2.32cm^2. - Left atrium: The appendage was moderately dilated. - Right ventricle: The cavity size was mildly dilated. - Right atrium: The atrium was mildly dilated. - Atrial septum: No defect or patent foramen ovale was identified. Impressions:  - No signifidfant intra-ventricular dyssynchrony.  Myoview 03/2014:  Low risk stress nuclear study with a large and severe scar in the right coronary artery territory  Cardiac Cath 05/05/16: 1. Ost RCA to Dist RCA lesion,  100% stenosed. Known occluded. RPDA fills via collaterals with minimal filling of PL system 2. Post Atrio lesion, 80% stenosed. 3. Lat 1st Mrg lesion, 100% stenosed. Appears to be jailed from prior stent. Fills via collaterals 4. Widely patent stent in OM1, D1 and mid LAD 5. There is mild to moderate left ventricular systolic dysfunction. 6. Severely elevated LVEDP 7. No new potential culprit lesion visualized to explain the patient's presentation with ventricular tachycardiac. There is a but appears to be chronically occluded branch of an OM as well as known occlusion of the RCA. 8. He has known moderately reduced EF with now severely elevated LVEDP. 9. Plan: Return to TCU for continued care. Consider diuresis for elevated LVEDP. This is in the setting of normal to low systemic pressures. Continue to titrate antiarrhythmic medications.  RADIOLOGY: Dg Chest Port 1 View  05/04/2016  CLINICAL DATA:  55 year old with current history of ischemic cardiomyopathy, indwelling pacing defibrillator, combined systolic and diastolic CHF (class 1), presenting with palpitations. EXAM: PORTABLE CHEST 1 VIEW COMPARISON:  10/06/2011, 11/01/2009. FINDINGS: Left subclavian pacing defibrillator unchanged and appears intact. Cardiac silhouette mildly to moderately enlarged, unchanged. Mild chronic pulmonary venous hypertension without overt edema. Minimal linear atelectasis at the left lung base. Lungs otherwise clear. No confluent airspace consolidation. No pleural effusions. External pacing pad. IMPRESSION: Stable cardiomegaly without pulmonary edema. Minimal linear atelectasis at the left lung base. No acute cardiopulmonary disease otherwise. Electronically Signed   By: Evangeline Dakin M.D.   On: 05/04/2016 15:26   ASSESSMENT/PLAN:   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 Patient is now s/p cardioversion to sinus rhythm.   Sustained Ventricular Tachycardia: Now s/p DCCV in sinus rhythm. H/o of VTach with defibrillator in place. 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 Patient was seen by EP yesterday who recommends transitioning to oral amiodarone and considering ablation if he has recurrent VT. His device has been reprogrammed to provide ATP if needed for recurrent slower VT. Telemetry revealed 12 beat run of WCT early this morning. Patient was asymptomatic.  -Discontinue amiodarone gtt -Start amiodarone 400 mg BID -Continue Coreg 80 mg daily -Continue mexiletine 300 mg BID- will confirm with EP that they want both mexiletine and amio on board -EP following -Echo pending, will need to follow up echo report due to concern for aneurysm -Transfer to telemetry  CAD: Cardiac Cath revealed Ost RCA to Dist RCA lesion, 100% stenosed. Known occluded. RPDA fills via collaterals with minimal filling of PL system. Post Atrio lesion, 80% stenosed. Lat 1st Mrg lesion, 100% stenosed. Appears to be jailed from  prior stent. Fills via collaterals. Widely patent stent in OM1, D1 and mid LAD. -Continue ASA 81 mg daily -Continue atorvastatin 80 mg daily -Continue Coreg 80 mg daily -Resume Plavix  -Resume Entresto   Chronic HFrEF: NYHA Class I. Echo in 2014 showed mildly reduced ejection fraction 45%. Cath revealed severely elevated LVEDP; however, patient appears euvolemic on exam.  -Continue carvedilol 80 mg daily -Resume Entresto when able -Holding HCTZ, amlodipine, hydralazine due to soft BPs  -Repeat Echo pending  Hypokalemia: Resolved. -Repeat BMET tomorrow am  HLD: Total cholesterol 92, TG 54, HDL 22, LDL 59.  Patient is on atorvastatin 80 mg daily at home.  -Continue atorvastatin 80 mg daily  HTN: 110s/70s overnight.   -Continue Coreg 80 mg daily -Restart Entresto  -Holding hydralazine, HCTZ, and amlodipine  OSA:  -CPAP QHS  DVT/PE ppx: Heparin TID FEN: [place], DO PGY-2 Internal Medicine Resident Pager # [phone] 05/06/2016 10:56 AM  The patient has been seen in conjunction with [place], DO. All aspects of care have been considered and discussed. The patient has been personally interviewed, examined, and all clinical data has been reviewed.   He has no recurrent VT.  Convert Amiodarone to oral and monitor rhythm. Possible DC tomorrow.  EP f/u will be needed.  Transfer to floor.  Review echo for evidence of aneurysm.

## 2016-05-06 NOTE — Progress Notes (Signed)
Called [facility] office to notify them pt was in the hospital per his request. Stated he was scheduled for remote defib check today. Carroll Kinds RN

## 2016-05-06 NOTE — Telephone Encounter (Signed)
LMOVM reminding pt to send remote transmission.   

## 2016-05-06 NOTE — Progress Notes (Signed)
SUBJECTIVE: The patient is doing well today.  At this time, he denies chest pain, shortness of breath, or any new concerns. Wants to go home.  Marland Kitchen aspirin EC  81 mg Oral Daily  . atorvastatin  80 mg Oral q1800  . carvedilol  80 mg Oral Daily  . heparin  5,000 Units Subcutaneous Q8H  . mexiletine  300 mg Oral BID  . sacubitril-valsartan  1 tablet Oral BID  . sodium chloride flush  3 mL Intravenous Q12H   . amiodarone 30 mg/hr (05/06/16 0719)    OBJECTIVE: Physical Exam: Filed Vitals:   05/06/16 0736 05/06/16 0800 05/06/16 0900 05/06/16 1000  BP: 116/75     Pulse: 50 61 60 66  Temp: 98.1 F (36.7 C)     TempSrc: Oral     Resp: 14 13 20 20   Height:      Weight:      SpO2: 96% 97% 93% 94%    Intake/Output Summary (Last 24 hours) at 05/06/16 1029 Last data filed at 05/06/16 1000  Gross per 24 hour  Intake 2339.95 ml  Output   3000 ml  Net -660.05 ml    Telemetry reveals sinus rhythm, one short NSVT  GEN- The patient is in NAD, alert and oriented x 3 today.  Wearing CPAP (when asleep) Head- normocephalic, atraumatic Eyes-  Sclera clear, conjunctiva pink Ears- hearing intact Oropharynx- clear Neck- supple, no JVP Lungs- Clear to ausculation bilaterally, normal work of breathing Heart- Regular rate and rhythm, no significant murmurs, no rubs or gallops GI- soft, NT, ND Extremities- no clubbing, cyanosis, or edema Skin- no rash or lesion Psych- euthymic mood, full affect Neuro- no gross deficits appreciated  LABS: Basic Metabolic Panel:  Recent Labs  05/04/16 2357 05/05/16 0526 05/06/16 0634  NA  --  140 138  K  --  3.3* 3.9  CL  --  109 107  CO2  --  27 26  GLUCOSE  --  104* 108*  BUN  --  9 8  CREATININE  --  1.05 0.86  CALCIUM  --  8.3* 8.3*  MG 2.0  --   --    Liver Function Tests:  Recent Labs  05/04/16 1530  AST 21  ALT 41  ALKPHOS 85  BILITOT 0.4  PROT 6.4*  ALBUMIN 3.4*     Recent Labs  05/04/16 1530 05/05/16 0526  WBC 12.2*  8.7  NEUTROABS 7.8*  --   HGB 15.2 13.3  HCT 45.8 40.0  MCV 94.6 93.5  PLT 203 165   Cardiac Enzymes:  Recent Labs  05/04/16 1822 05/04/16 2357 05/05/16 0526  TROPONINI 0.03 0.05* 0.05*   Fasting Lipid Panel:  Recent Labs  05/05/16 0526  CHOL 92  HDL 22*  LDLCALC 59  TRIG 54  CHOLHDL 4.2   Myoview 03/2014:  Low risk stress nuclear study with a large and severe scar in the right coronary artery territory  Cardiac Cath 05/05/16: 1. Ost RCA to Dist RCA lesion, 100% stenosed. Known occluded. RPDA fills via collaterals with minimal filling of PL system 2. Post Atrio lesion, 80% stenosed. 3. Lat 1st Mrg lesion, 100% stenosed. Appears to be jailed from prior stent. Fills via collaterals 4. Widely patent stent in OM1, D1 and mid LAD 5. There is mild to moderate left ventricular systolic dysfunction. 6. Severely elevated LVEDP 7. No new potential culprit lesion visualized to explain the patient's presentation with ventricular tachycardiac. There is a but appears to be  chronically occluded branch of an OM as well as known occlusion of the RCA. 8. He has known moderately reduced EF with now severely elevated LVEDP. 9. Plan: Return to TCU for continued care. Consider diuresis for elevated LVEDP. This is in the setting of normal to low systemic pressures. Continue to titrate antiarrhythmic medications.  Echo is pending   ASSESSMENT AND PLAN:  1. VT 160bpm  Neg Trop, cath yesterday as above, no new disease, no intervention  On amiodarone gtt with his mexiletine, will transition amiodarone to PO today, 400mg  BID  No reports of CP, and actively has been exercising without changes to his exertional capacity, though an ill parent has decreased how much he has been able to do more recently  Device checked, functioning as programmed with VT zone 200-240bpm, battery is nearing ERI, VT zone has been turned on at 159bpm with lots of ATP and no shock untile the rate accelerates to  180.      Patient if very anxious about getting home to some personal issues he needs to attend to, possible d/c tomorrow if no further VT  2. HTN  BP stable  3. ICM  Remains compensated by exam. He has had no angina. Await cardiac cath report     Fluid + , mostly from IV, amio gtt being stopped today, got IVF yesterday with/for cath, follow closely     On BB/ARB  5. hypokalemia  Being addressed by primary team     Better today  6. CAD     No CP, cath as above     On ASA/Plavix, BB, statin     Renal function stable post cath    Tommye Standard, PA-C 05/06/2016 10:29 AM  EP Attending  Patient seen and examined. CV - RRR , lungs - clear, Ext. - no edema. He has been stable and free of VT. He will be switched to oral amio today and home tomorrow from my perspective. His ICD has been reprogrammed to provide ATP and shock therapy at lower rates.  Mikle Bosworth.D.

## 2016-05-07 ENCOUNTER — Inpatient Hospital Stay (HOSPITAL_COMMUNITY): Payer: Worker's Compensation

## 2016-05-07 DIAGNOSIS — R52 Pain, unspecified: Secondary | ICD-10-CM

## 2016-05-07 DIAGNOSIS — R609 Edema, unspecified: Secondary | ICD-10-CM

## 2016-05-07 LAB — BASIC METABOLIC PANEL
Anion gap: 6 (ref 5–15)
BUN: 8 mg/dL (ref 6–20)
CHLORIDE: 106 mmol/L (ref 101–111)
CO2: 28 mmol/L (ref 22–32)
CREATININE: 1.04 mg/dL (ref 0.61–1.24)
Calcium: 8.4 mg/dL — ABNORMAL LOW (ref 8.9–10.3)
GFR calc non Af Amer: >60 mL/min (ref >60)
Glucose, Bld: 85 mg/dL (ref 65–99)
POTASSIUM: 3.5 mmol/L (ref 3.5–5.1)
Sodium: 140 mmol/L (ref 135–145)

## 2016-05-07 MED ORDER — POTASSIUM CHLORIDE CRYS ER 20 MEQ PO TBCR
40.0000 meq | EXTENDED_RELEASE_TABLET | Freq: Once | ORAL | Status: AC
  Administered 2016-05-07: 40 meq via ORAL
  Filled 2016-05-07: qty 2

## 2016-05-07 MED ORDER — AMIODARONE HCL 200 MG PO TABS
400.0000 mg | ORAL_TABLET | Freq: Every day | ORAL | Status: DC
  Administered 2016-05-07: 400 mg via ORAL
  Filled 2016-05-07: qty 2

## 2016-05-07 MED ORDER — AMIODARONE HCL 400 MG PO TABS: 400.0000 mg | ORAL_TABLET | Freq: Every day | ORAL | Status: DC

## 2016-05-07 MED ORDER — IBUPROFEN 400 MG PO TABS
400.0000 mg | ORAL_TABLET | Freq: Four times a day (QID) | ORAL | Status: DC | PRN
  Filled 2016-05-07: qty 1

## 2016-05-07 MED ORDER — IBUPROFEN 400 MG PO TABS: 400.0000 mg | ORAL_TABLET | Freq: Four times a day (QID) | ORAL | Status: DC | PRN

## 2016-05-07 NOTE — Discharge Instructions (Signed)
TAKE ALL MEDICATIONS AS PRESCRIBED IN THIS PACKET.  FOR YOUR RIGHT ARM, KEEP ELEVATED, APPLY HEAT AND ICE, AND TAKING IBUPROFEN (ADVIL) 400 MG EVERY 6 HOURS AS NEEDED FOR PAIN AND SWELLING.   PLEASE GO TO THE CARDIOLOGIST AT [facility] ON WED. 05/21/16 DURING BUSINESS HOURS TO HAVE A BLOOD DRAW.  RETURN TO THE OFFICE ON 05/26/16 AT 2 PM FOR AN APPOINTMENT. FOLLOW UP WITH DR. Caryl Comes ON 06/10/16 AT 1:30  PM.

## 2016-05-07 NOTE — Progress Notes (Signed)
Patient  Alert and oriented , denies pain, no shortness of breath.iv and tele d/c, d/c instruction explain and given to the pt.all questions answered/ Pt. D/c per order

## 2016-05-07 NOTE — Progress Notes (Signed)
SUBJECTIVE: The patient is doing well today.  At this time, he denies chest pain, shortness of breath, or any new concerns. Wants to go home. Mild discomfort at prior IV site, improved with local cold compress overnight  . amiodarone  400 mg Oral BID  . aspirin EC  81 mg Oral Daily  . atorvastatin  80 mg Oral q1800  . carvedilol  80 mg Oral Daily  . clopidogrel  75 mg Oral Daily  . heparin  5,000 Units Subcutaneous Q8H  . mexiletine  300 mg Oral BID  . sacubitril-valsartan  1 tablet Oral BID  . sodium chloride flush  3 mL Intravenous Q12H      OBJECTIVE: Physical Exam: Filed Vitals:   05/06/16 1730 05/06/16 2030 05/07/16 0000 05/07/16 0411  BP: 139/86 127/85 125/71 110/69  Pulse: 67 67 63 61  Temp: 98.8 F (37.1 C) 99.1 F (37.3 C) 98.9 F (37.2 C) 98.9 F (37.2 C)  TempSrc: Oral Oral Oral Oral  Resp:  18 18 18   Height:      Weight:    279 lb 15.8 oz (127 kg)  SpO2: 98% 95% 98% 98%    Intake/Output Summary (Last 24 hours) at 05/07/16 0859 Last data filed at 05/07/16 0549  Gross per 24 hour  Intake 1130.1 ml  Output   1550 ml  Net -419.9 ml    Telemetry reveals sinus rhythm, no VT  GEN- The patient is in NAD, alert and oriented x 3 today.  Wearing CPAP (when asleep) Head- normocephalic, atraumatic Eyes-  Sclera clear, conjunctiva pink Ears- hearing intact Oropharynx- clear Neck- supple, no JVP Lungs- Clear to ausculation bilaterally, normal work of breathing Heart- Regular rate and rhythm, no significant murmurs, no rubs or gallops GI- soft, NT, ND Extremities- no clubbing, cyanosis, or edema LE,  RUE with some mild swelling around prior IV site, no erythema or increased heat to the the tissue Skin- no rash or lesion Psych- euthymic mood, full affect Neuro- no gross deficits appreciated  LABS: Basic Metabolic Panel:  Recent Labs  05/04/16 2357  05/06/16 0634 05/07/16 0430  NA  --   < > 138 140  K  --   < > 3.9 3.5  CL  --   < > 107 106  CO2  --    < > 26 28  GLUCOSE  --   < > 108* 85  BUN  --   < > 8 8  CREATININE  --   < > 0.86 1.04  CALCIUM  --   < > 8.3* 8.4*  MG 2.0  --   --   --   < > = values in this interval not displayed. Liver Function Tests:  Recent Labs  05/04/16 1530  AST 21  ALT 41  ALKPHOS 85  BILITOT 0.4  PROT 6.4*  ALBUMIN 3.4*     Recent Labs  05/04/16 1530 05/05/16 0526  WBC 12.2* 8.7  NEUTROABS 7.8*  --   HGB 15.2 13.3  HCT 45.8 40.0  MCV 94.6 93.5  PLT 203 165   Cardiac Enzymes:  Recent Labs  05/04/16 1822 05/04/16 2357 05/05/16 0526  TROPONINI 0.03 0.05* 0.05*   Fasting Lipid Panel:  Recent Labs  05/05/16 0526  CHOL 92  HDL 22*  LDLCALC 59  TRIG 54  CHOLHDL 4.2   Myoview 03/2014:  Low risk stress nuclear study with a large and severe scar in the right coronary artery territory  Cardiac Cath  05/05/16: 1. Ost RCA to Dist RCA lesion, 100% stenosed. Known occluded. RPDA fills via collaterals with minimal filling of PL system 2. Post Atrio lesion, 80% stenosed. 3. Lat 1st Mrg lesion, 100% stenosed. Appears to be jailed from prior stent. Fills via collaterals 4. Widely patent stent in OM1, D1 and mid LAD 5. There is mild to moderate left ventricular systolic dysfunction. 6. Severely elevated LVEDP 7. No new potential culprit lesion visualized to explain the patient's presentation with ventricular tachycardiac. There is a but appears to be chronically occluded branch of an OM as well as known occlusion of the RCA. 8. He has known moderately reduced EF with now severely elevated LVEDP. 9. Plan: Return to TCU for continued care. Consider diuresis for elevated LVEDP. This is in the setting of normal to low systemic pressures. Continue to titrate antiarrhythmic medications.  Echo is pending   ASSESSMENT AND PLAN:  1. VT 160bpm  Neg Trop, cath yesterday as above, no new disease, no intervention  On mexiletine and amiodarone, to go home on 400mg  daily  No reports of CP,  and actively has been exercising without changes to his exertional capacity, though an ill parent has decreased how much he has been able to do more recently  Device checked, functioning as programmed with VT zone 200-240bpm, battery is nearing ERI, VT zone has been turned on at 159bpm with lots of ATP and no shock until the rate accelerates to 180.  OK to discharge from EP standpoint today Will arrange follow-up with Dr. Caryl Comes for 28mo       2. HTN  BP stable  3. ICM  Remains compensated by exam. He has had no angina.      Fluid - yesterday without the IV     Echo with improved EF     On BB/ARB  5. hypokalemia  remains resolved  6. CAD     No CP, cath stable CAD, no intervention     On ASA/Plavix, BB, statin     Renal function stable post cath    Tommye Standard, PA-C 05/07/2016 8:59 AM  EP Attending  Patient seen and examined. Agree with above. He has remained free of VT and has had no sinus brady. His exam is unremarkable although he has what looks like a little chemical phlebitis in the right arm. [place] for discharge from our perspective. He will followup with Dr. Renaldo Reel.   Mikle Bosworth.D.

## 2016-05-07 NOTE — Care Management Note (Signed)
Case Management Note  Patient Details  Name: Ranjeet Vannest MRN: [id number] Date of Birth: [birth year]  Subjective/Objective:            Admitted with VT  Action/Plan: Patient lives at home, no PCP/ no medical insurance; patient does not want to go to the [facility] and [facility] for primary care; Pharmacy of choice is [facility]; patient states that he does not have any problem getting his medication. Independent of ADL's.  Expected Discharge Date:  05/07/16               Expected Discharge Plan:  Home/Self Care  In-House Referral:   Financial Counselor / self pay  Discharge planning Services  CM Consult  Choice offered to:  Patient, NA  Status of Service:  In process, will continue to follow  Sherrilyn Rist [phone] 05/07/2016, 11:05 AM

## 2016-05-07 NOTE — Progress Notes (Signed)
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   Patient was seen and examined this morning. He denies chest pain, shortness of breath or palpitations. He complains of right arm pain and swelling where is PIV site was, slightly improved from yesterday. He is ready to go home.  OBJECTIVE:   Vitals:   Filed Vitals:   05/06/16 2030 05/07/16 0000 05/07/16 0411 05/07/16 0954  BP: 127/85 125/71 110/69 112/59  Pulse: 67 63 61 62  Temp: 99.1 F (37.3 C) 98.9 F (37.2 C) 98.9 F (37.2 C)   TempSrc: Oral Oral Oral   Resp: 18 18 18    Height:      Weight:   279 lb 15.8 oz (127 kg)   SpO2: 95% 98% 98%    I&O's:    Intake/Output Summary (Last 24 hours) at 05/07/16 1214 Last data filed at 05/07/16 0549  Gross per 24 hour  Intake    720 ml  Output   1175 ml  Net   -455 ml   TELEMETRY: Reviewed telemetry pt in sinus rhythm.   PHYSICAL EXAM General: Vital signs reviewed.  Patient is well-developed and well-nourished, in no acute distress and cooperative with exam.  Neck: Supple, trachea midline, no JVD.  Cardiovascular: Regular rate, regular rhythm, S1 normal, S2 normal, no murmurs, gallops, or rubs. Pulmonary/Chest: Clear to auscultation bilaterally, no wheezes, rales, or rhonchi. Abdominal: Soft, non-tender, non-distended, BS + Extremities: Anterior proximal RUE edema, erythema and tenderness surrounding site where PIV was (now removed). Normal radial pulse, sensation and strength. No lower extremity edema bilaterally, pulses symmetric and  intact bilaterally.   LABS: Basic Metabolic Panel:  Recent Labs  05/04/16 2357  05/06/16 0634 05/07/16 0430  NA  --   < > 138 140  K  --   < > 3.9 3.5  CL  --   < > 107 106  CO2  --   < > 26 28  GLUCOSE  --   < > 108* 85  BUN  --   < > 8 8  CREATININE  --   < > 0.86 1.04  CALCIUM  --   < > 8.3* 8.4*  MG 2.0  --   --   --   < > = values in this interval not displayed. Liver Function Tests:  Recent Labs  05/04/16 1530  AST 21  ALT 41  ALKPHOS 85  BILITOT 0.4  PROT 6.4*  ALBUMIN 3.4*   CBC:  Recent Labs  05/04/16 1530 05/05/16 0526  WBC 12.2* 8.7  NEUTROABS 7.8*  --   HGB 15.2 13.3  HCT 45.8 40.0  MCV 94.6 93.5  PLT 203 165   Cardiac Enzymes:  Recent Labs  05/04/16 1822 05/04/16 2357 05/05/16 0526  TROPONINI 0.03 0.05* 0.05*   Coag Panel:   Lab Results  Component Value Date   INR 1.22 05/05/2016   INR 1.12 10/30/2009   ECHO 05/06/16: Study Conclusions  - Left ventricle: The cavity size was normal. Wall thickness was  increased in a pattern of moderate LVH. Systolic function was  normal. The  estimated ejection fraction was in the range of 60%  to 65%. Wall motion was normal; there were no regional wall  motion abnormalities. Doppler parameters are consistent with  abnormal left ventricular relaxation (grade 1 diastolic  dysfunction). - Aortic valve: Mildly calcified annulus. Mildly thickened, mildly  calcified leaflets. - Left atrium: The atrium was mildly dilated.  Myoview 03/2014:  Low risk stress nuclear study with a large and severe scar in the right coronary artery territory  Cardiac Cath 05/05/16: 1. Ost RCA to Dist RCA lesion, 100% stenosed. Known occluded. RPDA fills via collaterals with minimal filling of PL system 2. Post Atrio lesion, 80% stenosed. 3. Lat 1st Mrg lesion, 100% stenosed. Appears to be jailed from prior stent. Fills via collaterals 4. Widely patent stent in OM1, D1 and mid LAD 5. There is mild to moderate left  ventricular systolic dysfunction. 6. Severely elevated LVEDP 7. No new potential culprit lesion visualized to explain the patient's presentation with ventricular tachycardiac. There is a but appears to be chronically occluded branch of an OM as well as known occlusion of the RCA. 8. He has known moderately reduced EF with now severely elevated LVEDP. 9. Plan: Return to TCU for continued care. Consider diuresis for elevated LVEDP. This is in the setting of normal to low systemic pressures. Continue to titrate antiarrhythmic medications.  RADIOLOGY: Dg Chest Port 1 View  05/04/2016  CLINICAL DATA:  55 year old with current history of ischemic cardiomyopathy, indwelling pacing defibrillator, combined systolic and diastolic CHF (class 1), presenting with palpitations. EXAM: PORTABLE CHEST 1 VIEW COMPARISON:  10/06/2011, 11/01/2009. FINDINGS: Left subclavian pacing defibrillator unchanged and appears intact. Cardiac silhouette mildly to moderately enlarged, unchanged. Mild chronic pulmonary venous hypertension without overt edema. Minimal linear atelectasis at the left lung base. Lungs otherwise clear. No confluent airspace consolidation. No pleural effusions. External pacing pad. IMPRESSION: Stable cardiomegaly without pulmonary edema. Minimal linear atelectasis at the left lung base. No acute cardiopulmonary disease otherwise. Electronically Signed   By: Evangeline Dakin M.D.   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 Patient is now s/p cardioversion to sinus rhythm.   Sustained Ventricular Tachycardia: Now s/p DCCV in  sinus rhythm. H/o of VTach with defibrillator in place. No evidence of new ischemia on cardiac cath yesterday. Echo showed no evidence of aneurysm. -Discharge on amiodarone 400 mg QD -Continue Coreg 80 mg daily -Continue mexiletine 300 mg BID -Follow up with Dr. Caryl Comes in one month -Discharge home today  Possible Superficial Thrombophlebitis: Anterior proximal RUE edema, erythema and tenderness surrounding site where PIV was (now removed). Normal radial pulse, sensation and strength. Likely secondary to PIV. Patient states pain and swelling have improved some. Doppler was negative for DVT. Superficial thrombosis noted in right basilic vein at [facility] fossa. Plan for treatment with ice/heat, elevation, and NSAIDs. -Continue elevation -Continue heat/ice -Ibuprofen 400 mg Q6H prn  CAD: Cardiac Cath revealed Ost RCA to Dist RCA lesion, 100% stenosed. Known occluded. RPDA fills via collaterals with minimal filling of PL system. Post Atrio lesion, 80% stenosed. Lat 1st Mrg lesion, 100% stenosed. Appears to be jailed from prior stent. Fills via collaterals.  Widely patent stent in OM1, D1 and mid LAD. -Continue ASA 81 mg daily -Continue atorvastatin 80 mg daily -Continue Coreg 80 mg daily -Continue Plavix 75 mg daily -Continue Entresto   Chronic HFrEF: NYHA Class I. Echo on 5/23 showed EF 55-60% and grade 1 diastolic dysfunction, improved from prior. -Continue carvedilol 80 mg daily -Resume Entresto when able -Consider restarting HCTZ -Continue to hold amlodipine, hydralazine due to soft BPs   HLD: Total cholesterol 92, TG 54, HDL 22, LDL 59. Patient is on atorvastatin 80 mg daily at home.  -Continue atorvastatin 80 mg daily  HTN: 110-130s/80s overnight and this morning.   -Continue Coreg 80 mg daily -Continue Entresto and Coreg -Consider restarting HCTZ 25 mg daily -Holding hydralazine, and amlodipine  OSA:  -CPAP QHS  DVT/PE ppx: Heparin TID FEN: HH  Martyn Malay, DO PGY-2 Internal  Medicine Resident Pager # [phone] 05/07/2016 12:14 PM  The patient has been seen in conjunction with [place], D.O. All aspects of care have been considered and discussed. The patient has been personally interviewed, examined, and all clinical data has been reviewed.   Please see the discharge summary.

## 2016-05-07 NOTE — Progress Notes (Signed)
*  PRELIMINARY RESULTS* Vascular Ultrasound Right upper extremity venous duplex has been completed.  Preliminary findings: negative for DVT. Superficial thrombosis noted in right basilic vein at [facility] fossa.  Gave verbal results to pt nurse.   Landry Mellow, RDMS, RVT  05/07/2016, 11:55 AM

## 2016-05-07 NOTE — Discharge Summary (Signed)
Discharge Summary    Patient ID: Dion Latendresse,  MRN: [id number], DOB/AGE: Mar 15, 1961 55 y.o.  Admit date: 05/04/2016 Discharge date: 05/07/2016  Primary Care Provider: No primary care provider on file. Primary Cardiologist: Dr. Orene Desanctis  Discharge Diagnoses    Active Problems:   Ventricular tachycardia ([place])   Cardiomyopathy, ischemic   Coronary artery disease involving native heart  Allergies No Known Allergies  Diagnostic Studies/Procedures    TTE 05/06/16 Study Conclusions  - Left ventricle: The cavity size was normal. Wall thickness was  increased in a pattern of moderate LVH. Systolic function was  normal. The estimated ejection fraction was in the range of 60%  to 65%. Wall motion was normal; there were no regional wall  motion abnormalities. Doppler parameters are consistent with  abnormal left ventricular relaxation (grade 1 diastolic  dysfunction). - Aortic valve: Mildly calcified annulus. Mildly thickened, mildly  calcified leaflets. - Left atrium: The atrium was mildly dilated. _____________  LHC 05/05/16: 1. Ost RCA to Dist RCA lesion, 100% stenosed. Known occluded. RPDA fills via collaterals with minimal filling of PL system 2. Post Atrio lesion, 80% stenosed. 3. Lat 1st Mrg lesion, 100% stenosed. Appears to be jailed from prior stent. Fills via collaterals 4. Widely patent stent in OM1, D1 and mid LAD 5. There is mild to moderate left ventricular systolic dysfunction. 6. Severely elevated LVEDP  No new potential culprit lesion visualized to explain the patient's presentation with ventricular tachycardiac. There is a but appears to be chronically occluded branch of an OM as well as known occlusion of the RCA.  He has known moderately reduced EF with now severely elevated LVEDP.  Plan: 1. Return to TCU for continued care. 2. Consider diuresis for elevated LVEDP. This is in the setting of normal to low systemic  pressures. 3. Continue to titrate antiarrhythmic medications.   History of Present Illness     55 year old with ischemic cardiomyopathy, history of sustained ventricular tachycardia, chronic total occlusion of right coronary artery with previous drug-eluting stents to the diagonal and circumflex artery approximate 10 years ago, inferolateral scar with ejection fraction of 45-50% with well compensated chronic systolic and diastolic heart failure, obstructive sleep apnea on CPAP, hyperlipidemia, erectile dysfunction with Medtronic Virtuoso defibrillator that was getting close to recommended replacement, 2.68 V with ERI of 2.63 V who recently showed no evidence of atrial fibrillation and no ventricular tachycardia who presented to the emergency room today with sustained ventricular tachycardia and was cardioverted by the emergency room to sinus rhythm. He has not had angina pectoris. Nuclear stress test 2015 showed low risk findings. Presented to the emergency room after nondescript feeling fluttering in his chest when doing laundry. Felt a bit dizzy, yellow-like hue to his vision. Had his daughter check his pulse and it was elevated. Eventually went to the emergency room and was found to be in ventricular tachycardia, heart rate of 160 bpm. He was subsequently cardioverted in the emergency department and currently is in sinus rhythm rate 84 bpm with J-point ST elevation accentuated in the inferior leads as well as lateral precordial leads when compared to prior EKG. No complaints of chest pain. In fact he remains quite active in the gym 5 days a week.  In review of prior note from 02/19/15, Dr. Caryl Comes suggested adding wrist nausea or switching to amiodarone therapy if arrhythmia recurs.  His Medtronic device is programmed VVI.  Hospital Course     Consultants: EP  Mr. 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 Patient is now s/p cardioversion to sinus rhythm.   Sustained Ventricular Tachycardia: Now s/p DCCV in sinus rhythm. H/o of VTach with defibrillator in place. No evidence of new ischemia on cardiac cath. Echo showed no evidence of aneurysm. EP recommended considering ablation if he has recurrent VT. His device has been reprogrammed to provide ATP if needed for recurrent slower VT. Patient was discharged on amiodarone 400 mg QD, Coreg 80 mg daily, mexiletine 300 mg BID. Patient will follow up with Dr. Caryl Comes in one month.  CAD: Cardiac Cath revealed Ost RCA to Dist RCA lesion, 100% stenosed. Known occluded. RPDA fills via collaterals with minimal filling of PL system. Post Atrio lesion, 80% stenosed. Lat 1st Mrg lesion, 100% stenosed. Appears to be jailed from prior stent. Fills via collaterals. Widely patent stent in OM1, D1 and mid LAD. On discharge, patient was continued on ASA 81 mg daily, atorvastatin 80 mg daily, Coreg 80 mg daily, Plavix 75 mg daily and Entresto.  Chronic HFrEF: NYHA Class I. Echo on 5/23 showed EF 55-60% and grade 1 diastolic dysfunction, improved from prior.   HLD: Total cholesterol 92, TG 54, HDL 22, LDL 59. Patient is on atorvastatin 80 mg daily at home.   HTN: 110-130s/80s overnight and this morning.We continued Coreg 80 mg daily, Entresto and HCTZ on discharge. We held hydralazine, and amlodipine on discharge due to soft BPs. Consider restarting these as able. Will recheck BMET on 05/21/16 as patient may need additional potassium supplementation.   _____________  Discharge Vitals Blood pressure 130/83, pulse 59, temperature 98.8 F (37.1 C), temperature source Oral, resp. rate 18, height 6\' 6"  (1.981 m), weight 279 lb 15.8  oz (127 kg), SpO2 98 %.  Filed Weights   05/06/16 0338 05/06/16 1459 05/07/16 0411  Weight: 284 lb 3.2 oz (128.912 kg) 282 lb (127.914 kg) 279 lb 15.8 oz (127 kg)    Labs & Radiologic Studies     CBC  Recent Labs  05/04/16 1530 05/05/16 0526  WBC 12.2* 8.7  NEUTROABS 7.8*  --   HGB 15.2 13.3  HCT 45.8 40.0  MCV 94.6 93.5  PLT 203 [id number]   Basic Metabolic Panel  Recent Labs  05/04/16 2357  05/06/16 0634 05/07/16 0430  NA  --   < > 138 140  K  --   < > 3.9 3.5  CL  --   < > 107 106  CO2  --   < > 26 28  GLUCOSE  --   < > 108* 85  BUN  --   < > 8 8  CREATININE  --   < > 0.86 1.04  CALCIUM  --   < > 8.3* 8.4*  MG 2.0  --   --   --   < > = values in this interval not displayed. Liver Function Tests  Recent Labs  05/04/16 1530  AST 21  ALT 41  ALKPHOS 85  BILITOT 0.4  PROT 6.4*  ALBUMIN 3.4*   Cardiac Enzymes  Recent Labs  05/04/16 1822 05/04/16 2357 05/05/16 0526  TROPONINI 0.03 0.05* 0.05*   Fasting Lipid Panel  Recent Labs  05/05/16  0526  CHOL 92  HDL 22*  LDLCALC 59  TRIG 54  CHOLHDL 4.2    Dg Chest Port 1 View  05/04/2016  CLINICAL DATA:  55 year old with current history of ischemic cardiomyopathy, indwelling pacing defibrillator, combined systolic and diastolic CHF (class 1), presenting with palpitations. EXAM: PORTABLE CHEST 1 VIEW COMPARISON:  10/06/2011, 11/01/2009. FINDINGS: Left subclavian pacing defibrillator unchanged and appears intact. Cardiac silhouette mildly to moderately enlarged, unchanged. Mild chronic pulmonary venous hypertension without overt edema. Minimal linear atelectasis at the left lung base. Lungs otherwise clear. No confluent airspace consolidation. No pleural effusions. External pacing pad. IMPRESSION: Stable cardiomegaly without pulmonary edema. Minimal linear atelectasis at the left lung base. No acute cardiopulmonary disease otherwise. Electronically Signed   By: Evangeline Dakin M.D.   On: 05/04/2016 15:26     Disposition   Pt is being discharged home today in good condition.  Follow-up Plans & Appointments    Follow-up Information    Follow up with Virl Axe, MD On 06/10/2016.   Specialty:  Cardiology   Why:  1:30PM   Contact information:   [address] [place] [address] [phone]       Follow up with Kerin Ransom, PA-C On 05/26/2016.   Specialties:  Cardiology, Radiology   Why:  2 PM   Contact information:   [address] [place] [address] [phone]       Follow up with [facility] On 05/21/2016.   Specialty:  Cardiology   Why:  FOR A BLOOD DRAW TO CHECK POTASSIUM- ANYTIME DURING BUSINESS HOURS ON 05/21/16   Contact information:   [address] [place] [place] [phone]     Discharge Instructions    Diet - low sodium heart healthy    Complete by:  As directed      Increase activity slowly    Complete by:  As directed            Discharge Medications   Current Discharge Medication List    START taking these medications   Details  amiodarone (PACERONE) 400 MG tablet Take 1 tablet (400 mg total) by mouth daily. Qty: 30 tablet, Refills: 1    ibuprofen (ADVIL,MOTRIN) 400 MG tablet Take 1 tablet (400 mg total) by mouth every 6 (six) hours as needed for moderate pain. Qty: 30 tablet, Refills: 0      CONTINUE these medications which have NOT CHANGED   Details  aspirin 81 MG tablet Take 81 mg by mouth daily.    atorvastatin (LIPITOR) 80 MG tablet TAKE 1 TABLET BY MOUTH EVERY DAY Qty: 90 tablet, Refills: 3    clopidogrel (PLAVIX) 75 MG tablet TAKE 1 TABLET BY MOUTH EVERY DAY WITH BREAKFAST Qty: 90 tablet, Refills: 3    COREG CR 80 MG 24 hr capsule TAKE 1 CAPSULE BY MOUTH EVERY DAY Qty: 30 capsule, Refills: 6    hydrochlorothiazide (HYDRODIURIL) 25 MG tablet TAKE 1 TABLET BY MOUTH EVERY DAY Qty: 90 tablet, Refills: 2    potassium chloride (K-DUR,KLOR-CON) 10 MEQ  tablet TAKE 1 TABLET BY MOUTH EVERY DAY Qty: 90 tablet, Refills: 3    sacubitril-valsartan (ENTRESTO) 97-103 MG Take 1 tablet by mouth 2 (two) times daily. Qty: 60 tablet, Refills: 3    sildenafil (VIAGRA) 50 MG tablet Take 1 tablet (50 mg total) by mouth daily as needed for erectile dysfunction. Qty: 30 tablet, Refills: 0    mexiletine (MEXITIL) 150 MG capsule Take  2 capsules (300 mg total) by mouth 2 (two) times daily. Please call and schedule an appointment with Dr Claris Che: 120 capsule, Refills: 0      STOP taking these medications     amLODipine (NORVASC) 10 MG tablet      hydrALAZINE (APRESOLINE) 50 MG tablet         Aspirin prescribed at discharge?  Yes High Intensity Statin Prescribed? (Lipitor 40-80mg  or Crestor 20-40mg ): Yes Beta Blocker Prescribed? Yes For EF 45% or less, Was ACEI/ARB Prescribed? Yes ADP Receptor Inhibitor Prescribed? (i.e. Plavix etc.-Includes Medically Managed Patients): Yes For EF <40%, Aldosterone Inhibitor Prescribed? No:  Was EF assessed during THIS hospitalization? Yes Was Cardiac Rehab II ordered? (Included Medically managed Patients): No:    Outstanding Labs/Studies   Needs BMET on follow up  Duration of Discharge Encounter   Greater than 30 minutes including physician time.  Beacher May, DO PGY-2 Internal Medicine Resident Pager # [phone] 05/07/2016 12:26 PM  The patient has been seen in conjunction with [place], D.O. All aspects of care have been considered and discussed. The patient has been personally interviewed, examined, and all clinical data has been reviewed.   The patient is complicated. The patient has monomorphic ventricular tachycardia. Suppression is now being established with both oral amiodarone and Mexitil. He is cleared to discharge from the hospital by electrophysiology service.  He has no clinical symptoms of heart failure.   At discharge his medication regimen is altered compared to his typical  chronic outpatient regimen. Hydralazine, amlodipine are being held due to relatively low blood pressure. These can be reinstituted as needed.  He will have follow-up with Dr. Caryl Comes within the next 30 days. He will give consideration to VT ablation. He has laboratory work ordered for one week. He will see Kerin Ransom, PA-C in 2 weeks.

## 2016-05-21 ENCOUNTER — Telehealth: Payer: Self-pay | Admitting: Internal Medicine

## 2016-05-21 DIAGNOSIS — I472 Ventricular tachycardia, unspecified: Secondary | ICD-10-CM

## 2016-05-21 DIAGNOSIS — I255 Ischemic cardiomyopathy: Secondary | ICD-10-CM

## 2016-05-21 LAB — BASIC METABOLIC PANEL
BUN: 13 mg/dL (ref 7–25)
CO2: 27 mmol/L (ref 20–31)
Calcium: 8.7 mg/dL (ref 8.6–10.3)
Chloride: 104 mmol/L (ref 98–110)
Creat: 1.11 mg/dL (ref 0.70–1.33)
Glucose, Bld: 110 mg/dL — ABNORMAL HIGH (ref 65–99)
Potassium: 4.7 mmol/L (ref 3.5–5.3)
SODIUM: 139 mmol/L (ref 135–146)

## 2016-05-21 NOTE — Telephone Encounter (Signed)
New message     The pt showed up to have Potassium level drawn but no order in the computer per tech the order is written on the pt  Summary for the potassium level to be drawn at anytime

## 2016-05-21 NOTE — Telephone Encounter (Signed)
Order entered and released for [employer].  Jessica aware.

## 2016-05-26 ENCOUNTER — Encounter: Payer: Self-pay | Admitting: Cardiology

## 2016-05-26 ENCOUNTER — Ambulatory Visit (INDEPENDENT_AMBULATORY_CARE_PROVIDER_SITE_OTHER): Payer: Worker's Compensation | Admitting: Cardiology

## 2016-05-26 VITALS — BP 172/108 | HR 64 | Ht 78.0 in | Wt 277.1 lb

## 2016-05-26 DIAGNOSIS — I255 Ischemic cardiomyopathy: Secondary | ICD-10-CM

## 2016-05-26 DIAGNOSIS — I472 Ventricular tachycardia, unspecified: Secondary | ICD-10-CM

## 2016-05-26 DIAGNOSIS — Z9861 Coronary angioplasty status: Secondary | ICD-10-CM

## 2016-05-26 DIAGNOSIS — Z95810 Presence of automatic (implantable) cardiac defibrillator: Secondary | ICD-10-CM | POA: Diagnosis not present

## 2016-05-26 DIAGNOSIS — G4733 Obstructive sleep apnea (adult) (pediatric): Secondary | ICD-10-CM

## 2016-05-26 DIAGNOSIS — Z9989 Dependence on other enabling machines and devices: Secondary | ICD-10-CM

## 2016-05-26 DIAGNOSIS — I2510 Atherosclerotic heart disease of native coronary artery without angina pectoris: Secondary | ICD-10-CM | POA: Diagnosis not present

## 2016-05-26 DIAGNOSIS — I10 Essential (primary) hypertension: Secondary | ICD-10-CM

## 2016-05-26 MED ORDER — AMLODIPINE BESYLATE 10 MG PO TABS: 10.0000 mg | ORAL_TABLET | Freq: Every day | ORAL | Status: DC

## 2016-05-26 NOTE — Assessment & Plan Note (Signed)
Inferior wall scar by nuclear stress testing. EF 37% 2011 By echo EF 45-50% with inferior and lateral hypokinesis 2014 EF 60-65% by echo 05/06/16

## 2016-05-26 NOTE — Assessment & Plan Note (Signed)
Chronic total occlusion of the right coronary artery, drug-eluting Taxus stents to the diagonal and circumflex in 2005 in [place]. Cath 05/06/16- no culprit lesion identified 

## 2016-05-26 NOTE — Assessment & Plan Note (Signed)
Initially implanted 2005 Ventricular lead replacement for [employer] fidel[employer] advisor lead in 2010 Atrial lead low impedance Device programmed VVI Nearing EOL- reprogrammed during admission May 2017

## 2016-05-26 NOTE — Progress Notes (Signed)
05/26/2016 Gregory [name]   Sep 25, 1961  [id number]  Primary Physician No primary care provider on file. Primary Cardiologist: Dr Caryl Comes  HPI:  55 y.o. AA male with a history of VT with MDT ICD-Mexitil added 2015 for VT, CAD with chronic total occlusion of right coronary artery with previous drug-eluting stents to the diagonal and circumflex artery approximate 10 years ago in [place], inferolateral scar with ejection fraction of 45-50% with well compensated chronic systolic and diastolic heart failure, obstructive sleep apnea on CPAP, hyperlipidemia, and erectile dysfunction. The pt came to [facility] ER 05/04/16 with some visual changes, he checked his pulse and had a hard time feeling at states felt like it was irregular or was missing beats (by the pulse), he denies any palpitations or fluttering type symptoms, mentioning this to his daughter put her fit bit on him and HR showed in the 130's, and he came in at his family's insistance. He was noted to be in VT and DCCV in the ER to SR. He has not missed any doses of any of his medicines, and reported he had been feeling well. He was DCCV in ED to NSR and admitted. His VT treatment rate was lowered to 180. Amiodarone was added. Cath done 05/05/16 showed no culprit lesion, eecho showed an EF of 60-65%.    Current Outpatient Prescriptions  Medication Sig Dispense Refill  . amiodarone (PACERONE) 400 MG tablet Take 1 tablet (400 mg total) by mouth daily. 30 tablet 1  . aspirin 81 MG tablet Take 81 mg by mouth daily.    Marland Kitchen atorvastatin (LIPITOR) 80 MG tablet TAKE 1 TABLET BY MOUTH EVERY DAY (Patient taking differently: TAKE 1 TABLET BY MOUTH EVERY DAY IN EVENING) 90 tablet 3  . clopidogrel (PLAVIX) 75 MG tablet TAKE 1 TABLET BY MOUTH EVERY DAY WITH BREAKFAST 90 tablet 3  . COREG CR 80 MG 24 hr capsule TAKE 1 CAPSULE BY MOUTH EVERY DAY 30 capsule 6  . hydrochlorothiazide (HYDRODIURIL) 25 MG tablet TAKE 1 TABLET BY MOUTH EVERY DAY 90 tablet 2  .  ibuprofen (ADVIL,MOTRIN) 400 MG tablet Take 1 tablet (400 mg total) by mouth every 6 (six) hours as needed for moderate pain. 30 tablet 0  . mexiletine (MEXITIL) 150 MG capsule Take 2 capsules (300 mg total) by mouth 2 (two) times daily. Please call and schedule an appointment with Dr Caryl Comes 120 capsule 0  . potassium chloride (K-DUR,KLOR-CON) 10 MEQ tablet TAKE 1 TABLET BY MOUTH EVERY DAY 90 tablet 3  . sacubitril-valsartan (ENTRESTO) 97-103 MG Take 1 tablet by mouth 2 (two) times daily. 60 tablet 3  . sildenafil (VIAGRA) 50 MG tablet Take 1 tablet (50 mg total) by mouth daily as needed for erectile dysfunction. 30 tablet 0  . amLODipine (NORVASC) 10 MG tablet Take 1 tablet (10 mg total) by mouth daily. 30 tablet 11   No current facility-administered medications for this visit.    No Known Allergies  Social History   Social History  . Marital Status: Single    Spouse Name: N/A  . Number of Children: N/A  . Years of Education: N/A   Occupational History  . Not on file.   Social History Main Topics  . Smoking status: Former Smoker -- 0.50 packs/day for 30 years    Types: Cigarettes    Quit date: 05/31/2013  . Smokeless tobacco: Never Used  . Alcohol Use: No     Comment: ocassionally  . Drug Use: Not on file  .  Sexual Activity: Not on file   Other Topics Concern  . Not on file   Social History Narrative     Review of Systems: General: negative for chills, fever, night sweats or weight changes.  Cardiovascular: negative for chest pain, dyspnea on exertion, edema, orthopnea, palpitations, paroxysmal nocturnal dyspnea or shortness of breath Dermatological: negative for rash Respiratory: negative for cough or wheezing Urologic: negative for hematuria Abdominal: negative for nausea, vomiting, diarrhea, bright red blood per rectum, melena, or hematemesis Neurologic: negative for visual changes, syncope, or dizziness All other systems reviewed and are otherwise negative except as  noted above.    Blood pressure 172/108, pulse 64, height 6\' 6"  (1.981 m), weight 277 lb 2 oz (125.703 kg).  General appearance: alert, cooperative and no distress Neck: no carotid bruit and no JVD Lungs: clear to auscultation bilaterally Heart: regular rate and rhythm Extremities: no edema Skin: Skin color, texture, turgor normal. No rashes or lesions Neurologic: Grossly normal   ASSESSMENT AND PLAN:   Ventricular tachycardia [facility]) Admitted though ED with VT- shocked in ED Amiodarone added to Mexitil  ICD - dual chamber Medtronic programmed VVI Initially implanted 2005 Ventricular lead replacement for Sprint fidelis advisor lead in 2010 Atrial lead low impedance Device programmed VVI Nearing EOL- reprogrammed during admission May 2017   Ischemic cardiomyopathy Inferior wall scar by nuclear stress testing. EF 37% 2011 By echo EF 45-50% with inferior and lateral hypokinesis 2014 EF 60-65% by echo 05/06/16  CAD S/P percutaneous coronary angioplasty Chronic total occlusion of the right coronary artery, drug-eluting Taxus stents to the diagonal and circumflex in 2005 in [place]. Cath 05/06/16- no culprit lesion identified  OSA on CPAP .  HTN (hypertension) He was hypotensive in the hospital and Hydralazine and Amlodipine were held. B/P up now- resume Amlodipine   PLAN  NST in the offcie on exam and asymptomatic. He has a follow up with Dr Caryl Comes 6/27. I did resume his Amlodipine as his pressure was up today in the office. Labs from 05/21/16 reviewed.  Kerin Ransom PA-C 05/26/2016 2:42 PM

## 2016-05-26 NOTE — Assessment & Plan Note (Signed)
He was hypotensive in the hospital and Hydralazine and Amlodipine were held. B/P up now- resume Amlodipine

## 2016-05-26 NOTE — Patient Instructions (Signed)
Gregory [name], [place], has recommended making the following medication changes: 1. RESTART Amlodipine 10 mg - take 1 tablet by mouth daily  Please keep your previously scheduled appointment with Dr Caryl Comes on 06/10/16 at 1:30p.  If you need a refill on your cardiac medications before your next appointment, please call your pharmacy.

## 2016-05-26 NOTE — Assessment & Plan Note (Addendum)
Admitted though ED with VT- shocked in ED Amiodarone added to Mexitil

## 2016-05-29 ENCOUNTER — Other Ambulatory Visit: Payer: Self-pay | Admitting: Cardiovascular Disease

## 2016-06-05 ENCOUNTER — Other Ambulatory Visit: Payer: Self-pay | Admitting: Internal Medicine

## 2016-06-10 ENCOUNTER — Ambulatory Visit (INDEPENDENT_AMBULATORY_CARE_PROVIDER_SITE_OTHER): Payer: Worker's Compensation | Admitting: Internal Medicine

## 2016-06-10 ENCOUNTER — Encounter: Payer: Self-pay | Admitting: Internal Medicine

## 2016-06-10 VITALS — BP 140/82 | HR 56 | Ht 78.0 in | Wt 277.8 lb

## 2016-06-10 DIAGNOSIS — I5022 Chronic systolic (congestive) heart failure: Secondary | ICD-10-CM | POA: Diagnosis not present

## 2016-06-10 DIAGNOSIS — I472 Ventricular tachycardia, unspecified: Secondary | ICD-10-CM

## 2016-06-10 DIAGNOSIS — I493 Ventricular premature depolarization: Secondary | ICD-10-CM

## 2016-06-10 DIAGNOSIS — Z95810 Presence of automatic (implantable) cardiac defibrillator: Secondary | ICD-10-CM

## 2016-06-10 DIAGNOSIS — I255 Ischemic cardiomyopathy: Secondary | ICD-10-CM

## 2016-06-10 LAB — CUP PACEART INCLINIC DEVICE CHECK
Battery Voltage: 2.63 V
Brady Statistic AS VP Percent: 0 %
Brady Statistic AS VS Percent: 98.96 %
Brady Statistic RA Percent Paced: 1.04 %
Date Time Interrogation Session: 20170627155545
HIGH POWER IMPEDANCE MEASURED VALUE: 59 Ohm
HighPow Impedance: 47 Ohm
Implantable Lead Implant Date: 20051120
Implantable Lead Model: 7121
Lead Channel Impedance Value: 532 Ohm
Lead Channel Pacing Threshold Amplitude: 1 V
Lead Channel Pacing Threshold Pulse Width: 0.4 ms
Lead Channel Pacing Threshold Pulse Width: 1 ms
Lead Channel Sensing Intrinsic Amplitude: 16.5 mV
Lead Channel Sensing Intrinsic Amplitude: 17.875 mV
Lead Channel Sensing Intrinsic Amplitude: 4 mV
Lead Channel Setting Pacing Amplitude: 2.5 V
Lead Channel Setting Pacing Amplitude: 2.5 V
Lead Channel Setting Sensing Sensitivity: 0.6 mV
MDC IDC LEAD IMPLANT DT: 20101117
MDC IDC LEAD LOCATION: 753859
MDC IDC LEAD LOCATION: 753860
MDC IDC MSMT LEADCHNL RA SENSING INTR AMPL: 4.75 mV
MDC IDC MSMT LEADCHNL RV IMPEDANCE VALUE: 494 Ohm
MDC IDC MSMT LEADCHNL RV PACING THRESHOLD AMPLITUDE: 1.25 V
MDC IDC SET LEADCHNL RV PACING PULSEWIDTH: 0.4 ms
MDC IDC STAT BRADY AP VP PERCENT: 0.02 %
MDC IDC STAT BRADY AP VS PERCENT: 1.03 %
MDC IDC STAT BRADY RV PERCENT PACED: 0.02 %

## 2016-06-10 MED ORDER — AMIODARONE HCL 200 MG PO TABS
200.0000 mg | ORAL_TABLET | Freq: Every day | ORAL | Status: DC
Start: 2016-06-10 — End: 2016-12-03

## 2016-06-10 MED ORDER — AMIODARONE HCL 200 MG PO TABS: 200.0000 mg | ORAL_TABLET | Freq: Every day | ORAL | Status: DC

## 2016-06-10 NOTE — Patient Instructions (Addendum)
Medication Instructions: - Your physician has recommended you make the following change in your medication:  1) Stop mexiletene 2) Decrease amiodarone to 200 mg once daily  Labwork: - none  Procedures/Testing: - Your physician has requested that you have an exercise tolerance test- needs to be with Dr. Caryl Comes (ok to double book). For further information please visit [email or web address]. Please also follow instruction sheet, as given.  Follow-Up: - pending results of stress test  Any Additional Special Instructions Will Be Listed Below (If Applicable).     If you need a refill on your cardiac medications before your next appointment, please call your pharmacy.

## 2016-06-10 NOTE — Progress Notes (Signed)
No care team member to display   HPI  Gregory [name] is a 55 y.o. male Seen in followup for ventricular tachycardia associated with a previously implanted ICD in the context of congestive heart failure ischemic cardiomyopathy. Ejection fraction echo 2000 1445-50% and Myoview 2010 extensive inferolateral scar  He presented 5/17 not feeling good. He was found to be in sustained monomorphic ventricular tachycardia rate of 160 below his detection rate. His device was reprogrammed to 150.  Cath 5/17  Cath Ost RCA to Dist RCA lesion, 100% stenosed. Known occluded. RPDA fills via collaterals with minimal filling of PL system 1. Post Atrio lesion, 80% stenosed. 2. Lat 1st Mrg lesion, 100% stenosed. Appears to be jailed from prior stent. Fills via collaterals 3. Widely patent stent in OM1, D1 and mid LAD 4. There is mild to moderate left ventricular systolic dysfunction. Severely elevated LVEDP   he had been on mexiletine; amiodarone was added.  Device was reprogrammed to a detection rate of 150.  He feels well without chest pain or shortness of breath   He is not having any problems with the mexiletine except partly from some flatus    Past Medical History  Diagnosis Date  . CAD (coronary artery disease) 12/01/2013  . Ischemic cardiomyopathy 12/01/2013  . Chronic combined systolic and diastolic CHF, NYHA class 1 ([place]) 12/01/2013  . Erectile dysfunction 12/01/2013  . HTN (hypertension) 12/01/2013  . Hyperlipidemia 12/01/2013  . Sleep apnea     Past Surgical History  Procedure Laterality Date  . Cardiac defibrillator placement    . Wrist tenodesis    . Achilles tendon repair      lft foot  . Cardiac catheterization N/A 05/05/2016    Procedure: Left Heart Cath and Coronary Angiography;  Surgeon: Leonie Man, MD;  Location: [place] [facility];  Service: Cardiovascular;  Laterality: N/A;    Current Outpatient Prescriptions  Medication Sig Dispense Refill    . amiodarone (PACERONE) 400 MG tablet Take 1 tablet (400 mg total) by mouth daily. 30 tablet 1  . amLODipine (NORVASC) 10 MG tablet Take 1 tablet (10 mg total) by mouth daily. 30 tablet 11  . aspirin 81 MG tablet Take 81 mg by mouth daily.    Marland Kitchen atorvastatin (LIPITOR) 80 MG tablet TAKE 1 TABLET BY MOUTH EVERY DAY (Patient taking differently: TAKE 1 TABLET BY MOUTH EVERY DAY IN EVENING) 90 tablet 3  . clopidogrel (PLAVIX) 75 MG tablet TAKE 1 TABLET BY MOUTH EVERY DAY WITH BREAKFAST 90 tablet 3  . COREG CR 80 MG 24 hr capsule TAKE 1 CAPSULE BY MOUTH EVERY DAY 30 capsule 6  . hydrochlorothiazide (HYDRODIURIL) 25 MG tablet TAKE 1 TABLET BY MOUTH EVERY DAY 90 tablet 2  . ibuprofen (ADVIL,MOTRIN) 400 MG tablet Take 1 tablet (400 mg total) by mouth every 6 (six) hours as needed for moderate pain. 30 tablet 0  . mexiletine (MEXITIL) 150 MG capsule TAKE 2 CAPSULES(300 MG) BY MOUTH TWICE DAILY 120 capsule 0  . potassium chloride (K-DUR,KLOR-CON) 10 MEQ tablet TAKE 1 TABLET BY MOUTH EVERY DAY 90 tablet 3  . sacubitril-valsartan (ENTRESTO) 97-103 MG Take 1 tablet by mouth 2 (two) times daily. 60 tablet 3  . VIAGRA 50 MG tablet TAKE 1 TABLET(50 MG) BY MOUTH DAILY AS NEEDED FOR ERECTILE DYSFUNCTION 30 tablet 0   No current facility-administered medications for this visit.    No Known Allergies  Review of Systems negative except from HPI and PMH  Physical Exam BP 140/82 mmHg  Pulse 56  Ht 6\' 6"  (1.981 m)  Wt 277 lb 12.8 oz (126.009 kg)  BMI 32.11 kg/m2 Well developed and well nourished in no acute distress HENT normal E scleral and icterus clear Neck Supple JVP flat; carotids brisk and full Clear to ausculation  Regular rate and rhythm, no murmurs gallops or rub Soft with active bowel sounds No clubbing cyanosis   Edema Alert and oriented, grossly normal motor and sensory function Skin Warm and Dry  ECG from 3/8 was reviewed. Notably showed no ventricular ectopy   Assessment and   Plan  PVCs few and far between   Ventricular tahcycardia recurrent treated VT w successful ATP  ICD  medtronic The patient's device was interrogated and the information was fully reviewed.  The device was reprogrammed to  more ATP for VThe 200-240 beats per minute  Ischemic Cardiomyopathy Without symptoms of ischemia  CHF chronic systolic  Euvolemic continue current meds   He has had recurrent ventricular tachycardia. It was below his detection. We will decrease his detection rate from 150--130 area we have increased his intervals to detect the number of intervals and is ATP algorithm.  We will decrease his amiodarone from 400--200 mg a day.  We will bring him in for treadmill testing to look for excursion of his sinus rate into his VT zone\  We will plan to discontinue his mexiletine

## 2016-06-25 ENCOUNTER — Other Ambulatory Visit: Payer: Self-pay | Admitting: Cardiovascular Disease

## 2016-06-25 ENCOUNTER — Encounter: Payer: Self-pay | Admitting: Internal Medicine

## 2016-06-30 ENCOUNTER — Other Ambulatory Visit: Payer: Self-pay | Admitting: Cardiovascular Disease

## 2016-06-30 NOTE — Telephone Encounter (Signed)
Rx(s) sent to pharmacy electronically.  

## 2016-07-14 ENCOUNTER — Ambulatory Visit (INDEPENDENT_AMBULATORY_CARE_PROVIDER_SITE_OTHER): Payer: Worker's Compensation

## 2016-07-14 ENCOUNTER — Encounter: Payer: Self-pay | Admitting: Internal Medicine

## 2016-07-14 DIAGNOSIS — I472 Ventricular tachycardia, unspecified: Secondary | ICD-10-CM

## 2016-07-14 DIAGNOSIS — I5022 Chronic systolic (congestive) heart failure: Secondary | ICD-10-CM

## 2016-07-14 DIAGNOSIS — I255 Ischemic cardiomyopathy: Secondary | ICD-10-CM

## 2016-07-14 DIAGNOSIS — I493 Ventricular premature depolarization: Secondary | ICD-10-CM

## 2016-07-14 DIAGNOSIS — Z95810 Presence of automatic (implantable) cardiac defibrillator: Secondary | ICD-10-CM

## 2016-07-29 LAB — EXERCISE TOLERANCE TEST
CSEPEDS: 57 s
CSEPEW: 9.7 METS
CSEPHR: 69 %
CSEPPHR: 114 {beats}/min
Exercise duration (min): 4 min
MPHR: 163 {beats}/min
RPE: 18
Rest HR: 62 {beats}/min

## 2016-07-30 ENCOUNTER — Other Ambulatory Visit: Payer: Self-pay | Admitting: Cardiovascular Disease

## 2016-08-01 NOTE — Telephone Encounter (Signed)
F/U      Pt is calling back about his refill request. Please call.

## 2016-08-01 NOTE — Telephone Encounter (Signed)
Follow up   *STAT* If patient is at the pharmacy, call can be transferred to refill team.   1. Which medications need to be refilled? (please list name of each medication and dose if known) Viagra  2. Which pharmacy/location (including street and city if local pharmacy) is medication to be sent to? [facility], [address], [place], [place]  3. Do they need a 30 day or 90 day supply? 30 day  Pt voiced he's calling to follow up on the refill of medication and form that's needing the MD authorizations.  Pt voiced he has to do this every month even after approval from MD and Pharmacy but why does he have to call and do this every month. Pt voiced he shouldn't have to do this it should be between Md, Ins., and Pharmacy  Pt voiced the pharmacy faxed over form and they're waiting for the authorizations.  Please follow up with pt. Thanks!

## 2016-08-13 NOTE — Telephone Encounter (Signed)
Dr C. Please approve or deny

## 2016-08-13 NOTE — Telephone Encounter (Signed)
Okay to refill? 

## 2016-08-21 ENCOUNTER — Other Ambulatory Visit: Payer: Self-pay | Admitting: Cardiovascular Disease

## 2016-08-26 ENCOUNTER — Encounter: Payer: Self-pay | Admitting: Cardiovascular Disease

## 2016-08-26 ENCOUNTER — Ambulatory Visit (INDEPENDENT_AMBULATORY_CARE_PROVIDER_SITE_OTHER): Payer: Worker's Compensation | Admitting: Cardiovascular Disease

## 2016-08-26 VITALS — BP 124/81 | HR 51 | Ht 78.0 in | Wt 280.0 lb

## 2016-08-26 DIAGNOSIS — I472 Ventricular tachycardia, unspecified: Secondary | ICD-10-CM

## 2016-08-26 DIAGNOSIS — I255 Ischemic cardiomyopathy: Secondary | ICD-10-CM | POA: Diagnosis not present

## 2016-08-26 DIAGNOSIS — N522 Drug-induced erectile dysfunction: Secondary | ICD-10-CM

## 2016-08-26 DIAGNOSIS — Z95810 Presence of automatic (implantable) cardiac defibrillator: Secondary | ICD-10-CM

## 2016-08-26 DIAGNOSIS — I2510 Atherosclerotic heart disease of native coronary artery without angina pectoris: Secondary | ICD-10-CM

## 2016-08-26 DIAGNOSIS — G4733 Obstructive sleep apnea (adult) (pediatric): Secondary | ICD-10-CM | POA: Diagnosis not present

## 2016-08-26 DIAGNOSIS — T82110D Breakdown (mechanical) of cardiac electrode, subsequent encounter: Secondary | ICD-10-CM | POA: Diagnosis not present

## 2016-08-26 DIAGNOSIS — I5042 Chronic combined systolic (congestive) and diastolic (congestive) heart failure: Secondary | ICD-10-CM | POA: Diagnosis not present

## 2016-08-26 DIAGNOSIS — Z79899 Other long term (current) drug therapy: Secondary | ICD-10-CM | POA: Diagnosis not present

## 2016-08-26 DIAGNOSIS — I10 Essential (primary) hypertension: Secondary | ICD-10-CM | POA: Diagnosis not present

## 2016-08-26 DIAGNOSIS — E785 Hyperlipidemia, unspecified: Secondary | ICD-10-CM | POA: Diagnosis not present

## 2016-08-26 DIAGNOSIS — Z9989 Dependence on other enabling machines and devices: Secondary | ICD-10-CM

## 2016-08-26 LAB — COMPREHENSIVE METABOLIC PANEL
ALT: 21 U/L (ref 9–46)
AST: 16 U/L (ref 10–35)
Albumin: 4.1 g/dL (ref 3.6–5.1)
Alkaline Phosphatase: 73 U/L (ref 40–115)
BILIRUBIN TOTAL: 0.4 mg/dL (ref 0.2–1.2)
BUN: 12 mg/dL (ref 7–25)
CALCIUM: 9.3 mg/dL (ref 8.6–10.3)
CO2: 31 mmol/L (ref 20–31)
CREATININE: 1.02 mg/dL (ref 0.70–1.33)
Chloride: 103 mmol/L (ref 98–110)
GLUCOSE: 109 mg/dL — AB (ref 65–99)
Potassium: 5.3 mmol/L (ref 3.5–5.3)
SODIUM: 140 mmol/L (ref 135–146)
Total Protein: 6.8 g/dL (ref 6.1–8.1)

## 2016-08-26 NOTE — Progress Notes (Signed)
Patient ID: Gregory [name], male   DOB: 06/26/1961, 55 y.o.   MRN: [id number]    Cardiology Office Note    Date:  08/26/2016   ID:  Renea Ee [birth year], MRN [id number]  PCP:  Pcp Not In System  Cardiologist:  Jolyn Nap, M.D.; Sanda Klein, MD   Chief Complaint  Patient presents with  . Follow-up    PACE CHECK; Pt states no Sx or concerns.     History of Present Illness:  Gregory [name] is a 55 y.o. 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  In May 2017 he had palpitations, visual changes and slight dizziness and subsequently presented with sustained monomorphic ventricular tachycardia below ICD detection rate. He underwent urgent cardioversion in the emergency department. He subsequently underwent right and left heart catheterization showing no real change in coronary anatomy. A lateral branch of his first oblique marginal artery was subtotally occluded and there was high-grade disease in his PLA artery, neither one amenable to revascularization. His left ventricular end-diastolic pressure was however severely elevated. Amiodarone was initiated. VT detection rate was decreased to 150 bpm after hospitalization and subsequently decreased to 130 bpm when he returned with recurrent VT at his office appointment with Dr. Caryl Comes on June 27. Pre-shock antitachycardia pacing was made more aggressive. Mexiletine was stopped. Her remains on amiodarone at maintenance dose 200 mg daily, without side effects so far.  He has a dual-chamber Medtronic Virtuoso defibrillator that is  getting close to recommended replacement. Today battery voltage is 2.63 V (ERI 2.63 V, not yet triggered ERI). He has virtually no ventricular pacing (device programmed VVI 40). Atrial lead impedance is very low and is not in use. There has been no ventricular tachycardia since his last device check. The optivol readings are okay.   He denies angina or dyspnea at rest or with exertion and has not had leg edema, claudication, focal neurological deficits, palpitations or syncope  He needs a new CPAP machine. The old one is beginning to wear down. He reports 100% compliance with CPAP. His provider is [facility] in [place], contact Harriet Pho, [phone].  Past Medical History:  Diagnosis Date  . CAD (coronary artery disease) 12/01/2013  . Chronic combined systolic and diastolic CHF, NYHA class 1 ([place]) 12/01/2013  . Erectile dysfunction 12/01/2013  . HTN (hypertension) 12/01/2013  . Hyperlipidemia 12/01/2013  . Ischemic cardiomyopathy 12/01/2013  . Sleep apnea     Past Surgical History:  Procedure Laterality Date  . ACHILLES TENDON REPAIR     lft foot  . CARDIAC CATHETERIZATION N/A 05/05/2016   Procedure: Left Heart Cath and Coronary Angiography;  Surgeon: Leonie Man, MD;  Location: [place] [facility];  Service: Cardiovascular;  Laterality: N/A;  . CARDIAC DEFIBRILLATOR PLACEMENT    . WRIST TENODESIS      Outpatient Medications Prior to Visit  Medication Sig Dispense Refill  . amiodarone (PACERONE) 200 MG tablet Take 1 tablet (200 mg total) by mouth daily. 90 tablet 3  . amLODipine (NORVASC) 10 MG tablet Take 1 tablet (10 mg total) by mouth daily. 30 tablet 11  . aspirin 81 MG tablet Take 81 mg by mouth daily.    Marland Kitchen atorvastatin (  LIPITOR) 80 MG tablet Take 80 mg by mouth daily. In the evening    . clopidogrel (PLAVIX) 75 MG tablet TAKE 1 TABLET BY MOUTH EVERY DAY WITH BREAKFAST 90 tablet 0  . COREG CR 80 MG 24 hr capsule TAKE 1 CAPSULE BY MOUTH EVERY  DAY 30 capsule 6  . ENTRESTO 97-103 MG TAKE 1 TABLET BY MOUTH TWICE DAILY( AFTER TAKING THE 49/52 MG FOR 2 WEEKS) 60 tablet 2  . hydrochlorothiazide (HYDRODIURIL) 25 MG tablet TAKE 1 TABLET BY MOUTH EVERY DAY 90 tablet 2  . ibuprofen (ADVIL,MOTRIN) 400 MG tablet Take 1 tablet (400 mg total) by mouth every 6 (six) hours as needed for moderate pain. 30 tablet 0  . potassium chloride (K-DUR,KLOR-CON) 10 MEQ tablet TAKE 1 TABLET BY MOUTH EVERY DAY 90 tablet 3  . VIAGRA 50 MG tablet TAKE 1 TABLET(50 MG) BY MOUTH DAILY AS NEEDED FOR ERECTILE DYSFUNCTION 30 tablet 2   No facility-administered medications prior to visit.      Allergies:   Review of patient's allergies indicates no known allergies.   Social History   Social History  . Marital status: Single    Spouse name: N/A  . Number of children: N/A  . Years of education: N/A   Social History Main Topics  . Smoking status: Former Smoker    Packs/day: 0.50    Years: 30.00    Types: Cigarettes    Quit date: 05/31/2013  . Smokeless tobacco: Never Used  . Alcohol use No     Comment: ocassionally  . Drug use: Unknown  . Sexual activity: Not Asked   Other Topics Concern  . None   Social History Narrative  . None     Family History:  The patient's family history includes Brain cancer in his father; Heart disease in his mother.   ROS:   Please see the history of present illness.    ROS All other systems reviewed and are negative.   PHYSICAL EXAM:   VS:  BP 124/81   Pulse (!) 51   Ht 6\' 6"  (1.981 m)   Wt 280 lb (127 kg)   BMI 32.36 kg/m    GEN: Well nourished, well developed, in no acute distress  HEENT: normal  Neck: no JVD, carotid bruits, or masses Cardiac: RRR; no murmurs, rubs, or gallops,no edema ,healthy left subclavian defibrillator site Respiratory:  clear to auscultation bilaterally, normal work of breathing GI: soft, nontender, nondistended, + BS MS: no deformity or atrophy  Skin: warm and dry, no rash Neuro:   Alert and Oriented x 3, Strength and sensation are intact Psych: euthymic mood, full affect  Wt Readings from Last 3 Encounters:  08/26/16 280 lb (127 kg)  06/10/16 277 lb 12.8 oz (126 kg)  05/26/16 277 lb 2 oz (125.7 kg)      Studies/Labs Reviewed:   EKG:  EKG is ordered today.  The ekg ordered today demonstrates normal sinus rhythm, old inferior wall MI, QTC 448 ms  Recent Labs: 05/04/2016: ALT 41; Magnesium 2.0 05/05/2016: Hemoglobin 13.3; Platelets 165 05/21/2016: BUN 13; Creat 1.11; Potassium 4.7; Sodium 139   Lipid Panel    Component Value Date/Time   CHOL 92 05/05/2016 0526   TRIG 54 05/05/2016 0526   HDL 22 (L) 05/05/2016 0526   CHOLHDL 4.2 05/05/2016 0526   VLDL 11 05/05/2016 0526   LDLCALC 59 05/05/2016 0526     ASSESSMENT:    1. Chronic combined systolic and diastolic CHF, NYHA class 1 (  Gregory [name])   2. Sustained ventricular tachycardia (HCC)   3. On amiodarone therapy   4. ICD - dual chamber Medtronic programmed VVI   5. ICD (implantable cardioverter-defibrillator) lead failure, subsequent encounter   6. Coronary artery disease involving native coronary artery of native heart without angina pectoris   7. Essential hypertension   8. Hyperlipidemia   9. Drug-induced erectile dysfunction      PLAN:  In order of problems listed above:  1. CHF: NYHA class 1, clinically euvolemic without taking loop diuretics. On Entresto and max dose beta blocker. 2. VT: he has a large inferolateral scar which is probably the source of VT. Device interrogation shows no new episodes. Tolerating amiodarone without any serious side effects.  3. Amiodarone: Will check baseline pulmonary function tests as well as TSH and liver function tests. Also advised that he needs ophthalmologic exams yearly and needs to avoid sun exposure. 4. ICD: normal device function, anticipate need for generator change out in next roughly 3 months. Alert tones have been demonstrated.  5. Atrial lead malfunction:  He'll also need a new atrial lead at the time of generator change out, since he is likely to become more more bradycardic with amiodarone and we need to avoid unnecessary ventricular pacing. 6. CAD: no angina, no evidence of reversible ischemia on last functional study. Angiography does not show any large conduits that require revascularization  7. HTN: well-controlled  8. HLP: excellent lipid profile in May. 9. ED: Good response to PDE 5 inhibitors, he knows that he should never mix these with nitrates. 10. OSA: Try to facilitate his new CPAP device.    Medication Adjustments/Labs and Tests Ordered: Current medicines are reviewed at length with the patient today.  Concerns regarding medicines are outlined above.  Medication changes, Labs and Tests ordered today are listed in the Patient Instructions below. Patient Instructions  Medication Instructions: Dr Sallyanne Kuster recommends that you continue on your current medications as directed. Please refer to the Current Medication list given to you today.  Labwork: Your physician recommends that you return for lab work TODAY.  Testing/Procedures: 1. Pulmonary Function Tests - Your physician has recommended that you have a pulmonary function test. Pulmonary Function Tests are a group of tests that measure how well air moves in and out of your lungs.  2. Remote Device Transmission - Remote monitoring is used to monitor your Pacemaker of ICD from home. This monitoring reduces the number of office visits required to check your device to one time per year. It allows [place] to keep an eye on the functioning of your device to ensure it is working properly. You are scheduled for a device check from home on Tuesday, December 12th, 2017. You may send your transmission at any time that day. If you have a wireless device, the transmission will be sent automatically. After your physician reviews your transmission, you will receive a postcard with your next transmission  date.  Follow-up: Dr Sallyanne Kuster recommends that you schedule a follow-up appointment in 6 months with a device check. You will receive a reminder letter in the mail two months in advance. If you don't receive a letter, please call our office to schedule the follow-up appointment.  If you need a refill on your cardiac medications before your next appointment, please call your pharmacy.   Signed, Sanda Klein, MD  08/26/2016 10:52 AM    [facility] [address], [place], [place]  [address] Phone: [phone]; Fax: [phone]

## 2016-08-26 NOTE — Patient Instructions (Signed)
Medication Instructions: Dr Sallyanne Kuster recommends that you continue on your current medications as directed. Please refer to the Current Medication list given to you today.  Labwork: Your physician recommends that you return for lab work TODAY.  Testing/Procedures: 1. Pulmonary Function Tests - Your physician has recommended that you have a pulmonary function test. Pulmonary Function Tests are a group of tests that measure how well air moves in and out of your lungs.  2. Remote Device Transmission - Remote monitoring is used to monitor your Pacemaker of ICD from home. This monitoring reduces the number of office visits required to check your device to one time per year. It allows [place] to keep an eye on the functioning of your device to ensure it is working properly. You are scheduled for a device check from home on Tuesday, December 12th, 2017. You may send your transmission at any time that day. If you have a wireless device, the transmission will be sent automatically. After your physician reviews your transmission, you will receive a postcard with your next transmission date.  Follow-up: Dr Sallyanne Kuster recommends that you schedule a follow-up appointment in 6 months with a device check. You will receive a reminder letter in the mail two months in advance. If you don't receive a letter, please call our office to schedule the follow-up appointment.  If you need a refill on your cardiac medications before your next appointment, please call your pharmacy.

## 2016-08-27 LAB — TSH: TSH: 2.24 m[IU]/L (ref 0.40–4.50)

## 2016-08-28 ENCOUNTER — Encounter (INDEPENDENT_AMBULATORY_CARE_PROVIDER_SITE_OTHER): Payer: Worker's Compensation | Admitting: Internal Medicine

## 2016-08-28 DIAGNOSIS — Z79899 Other long term (current) drug therapy: Secondary | ICD-10-CM

## 2016-08-28 LAB — PULMONARY FUNCTION TEST
DL/VA % PRED: 93 %
DL/VA: 4.69 ml/min/mmHg/L
DLCO COR % PRED: 84 %
DLCO UNC: 36.08 ml/min/mmHg
DLCO cor: 37.17 ml/min/mmHg
DLCO unc % pred: 81 %
FEF 25-75 POST: 3.28 L/s
FEF 25-75 Pre: 2.55 L/sec
FEF2575-%CHANGE-POST: 28 %
FEF2575-%PRED-POST: 84 %
FEF2575-%Pred-Pre: 65 %
FEV1-%CHANGE-POST: 6 %
FEV1-%PRED-POST: 92 %
FEV1-%Pred-Pre: 86 %
FEV1-Post: 3.95 L
FEV1-Pre: 3.72 L
FEV1FVC-%Change-Post: 3 %
FEV1FVC-%PRED-PRE: 91 %
FEV6-%Change-Post: 3 %
FEV6-%Pred-Post: 97 %
FEV6-%Pred-Pre: 94 %
FEV6-PRE: 5.02 L
FEV6-Post: 5.17 L
FEV6FVC-%Change-Post: 0 %
FEV6FVC-%Pred-Post: 101 %
FEV6FVC-%Pred-Pre: 100 %
FVC-%CHANGE-POST: 2 %
FVC-%PRED-POST: 96 %
FVC-%PRED-PRE: 94 %
FVC-POST: 5.25 L
FVC-PRE: 5.13 L
POST FEV1/FVC RATIO: 75 %
PRE FEV6/FVC RATIO: 98 %
Post FEV6/FVC ratio: 99 %
Pre FEV1/FVC ratio: 73 %
RV % pred: 141 %
RV: 3.77 L
TLC % pred: 104 %
TLC: 9.13 L

## 2016-09-11 ENCOUNTER — Other Ambulatory Visit: Payer: Self-pay | Admitting: Cardiovascular Disease

## 2016-09-16 LAB — CUP PACEART INCLINIC DEVICE CHECK
Brady Statistic AP VS Percent: 1.47 %
Brady Statistic AS VS Percent: 98.38 %
HIGH POWER IMPEDANCE MEASURED VALUE: 48 Ohm
HighPow Impedance: 61 Ohm
Implantable Lead Implant Date: 20051120
Implantable Lead Implant Date: 20101117
Implantable Lead Location: 753860
Lead Channel Sensing Intrinsic Amplitude: 16.875 mV
Lead Channel Sensing Intrinsic Amplitude: 16.875 mV
Lead Channel Setting Pacing Amplitude: 2.5 V
Lead Channel Setting Pacing Pulse Width: 0.4 ms
MDC IDC LEAD LOCATION: 753859
MDC IDC LEAD MODEL: 7121
MDC IDC MSMT BATTERY VOLTAGE: 2.63 V
MDC IDC MSMT LEADCHNL RA IMPEDANCE VALUE: 551 Ohm
MDC IDC MSMT LEADCHNL RA SENSING INTR AMPL: 3.75 mV
MDC IDC MSMT LEADCHNL RA SENSING INTR AMPL: 3.75 mV
MDC IDC MSMT LEADCHNL RV IMPEDANCE VALUE: 494 Ohm
MDC IDC SESS DTM: 20170912132753
MDC IDC SET LEADCHNL RV PACING AMPLITUDE: 2.5 V
MDC IDC SET LEADCHNL RV SENSING SENSITIVITY: 0.6 mV
MDC IDC STAT BRADY AP VP PERCENT: 0.14 %
MDC IDC STAT BRADY AS VP PERCENT: 0 %
MDC IDC STAT BRADY RA PERCENT PACED: 1.62 %
MDC IDC STAT BRADY RV PERCENT PACED: 0.15 %

## 2016-09-22 ENCOUNTER — Other Ambulatory Visit: Payer: Self-pay | Admitting: Cardiovascular Disease

## 2016-10-13 ENCOUNTER — Other Ambulatory Visit: Payer: Self-pay | Admitting: Cardiovascular Disease

## 2016-10-13 NOTE — Telephone Encounter (Signed)
Rx(s) sent to pharmacy electronically.  

## 2016-10-28 ENCOUNTER — Encounter: Payer: Self-pay | Admitting: Cardiovascular Disease

## 2016-10-28 ENCOUNTER — Telehealth: Payer: Self-pay

## 2016-10-28 NOTE — Telephone Encounter (Signed)
Called patient no answer.Left Dr.Croitoru's recommendations on personal voice mail. Called [facility] Dr.Croitoru's recommendations given.Note faxed to them at fax # [phone].

## 2016-10-28 NOTE — Telephone Encounter (Signed)
Patient walked in office.He broke 2 front teeth and needs 1 simple extraction and 1 surgical extraction.[facility] wanting clearance from Dr.Croitoru and if patient needs to hold Plavix.Message sent to Dr.Croitoru.

## 2016-10-28 NOTE — Telephone Encounter (Signed)
Low risk for dental extraction. OK to stop plavix if desired, but it will take 5 days to wear off. Please avoid use of epinephrine if possible. He has had VT. I cannot write a letter without the name of a provider that I can address it to (he does not even have a PCP listed).

## 2016-11-01 ENCOUNTER — Other Ambulatory Visit: Payer: Self-pay | Admitting: Cardiovascular Disease

## 2016-11-13 ENCOUNTER — Telehealth: Payer: Self-pay | Admitting: Cardiology

## 2016-11-13 NOTE — Telephone Encounter (Signed)
Pt called and stated that his device alarm went off. Requested to that pt send a manual transmission and once we receive it a device tech will call him back.

## 2016-11-13 NOTE — Telephone Encounter (Signed)
Spoke w/ pt and informed him that his device has reached ERI. Informed him that this means there was about 3 months left on the battery life. The device will alarm once day for the next 16 days at the same time. Informed pt that a scheduler will call him to schedule an appt w/ MD / NP / PA to discuss procedure and the procedure will be scheduled for another day. Pt verbalized understanding.

## 2016-11-25 ENCOUNTER — Other Ambulatory Visit: Payer: Self-pay | Admitting: Cardiovascular Disease

## 2016-11-25 ENCOUNTER — Telehealth: Payer: Self-pay | Admitting: Cardiology

## 2016-11-25 ENCOUNTER — Encounter: Payer: Self-pay | Admitting: *Deleted

## 2016-11-25 NOTE — Telephone Encounter (Signed)
REFILL 

## 2016-11-25 NOTE — Telephone Encounter (Signed)
Called pt to let him know about ERI. Pt already knew appt ERI. Pt has appt w/ MD on 12-03-2016.

## 2016-11-25 NOTE — Telephone Encounter (Signed)
Spoke with pt and reminded pt of remote transmission that is due today. Pt verbalized understanding.   

## 2016-11-26 ENCOUNTER — Encounter: Payer: Self-pay | Admitting: Cardiology

## 2016-12-01 ENCOUNTER — Other Ambulatory Visit: Payer: Self-pay | Admitting: Cardiovascular Disease

## 2016-12-01 NOTE — Progress Notes (Signed)
Electrophysiology Office Note Date: 12/03/2016  ID:  Cammeron, Blickenstaff 28-Nov-1961, MRN [id number]  PCP: Pcp Not In System Primary Cardiologist: Croitoru Electrophysiologist: Caryl Comes  CC: ICD at [place] is a 55 y.o. male seen today for Dr Caryl Comes.  He presents today for routine electrophysiology followup.  Since last being seen in our clinic, the patient reports doing very well. He has had some increase in tachy-palpitations but no ICD shocks. He denies chest pain, dyspnea, PND, orthopnea, nausea, vomiting, dizziness, syncope, edema, weight gain, or early satiety.  He has not had ICD shocks.   Device History: MDT dual chamber ICD implanted 2010 for ICM History of appropriate therapy: Yes History of AAD therapy: Yes - amiodarone and Mexiletine (Mexiletine discontinued 05/2016)   Past Medical History:  Diagnosis Date  . CAD (coronary artery disease) 12/01/2013  . Chronic combined systolic and diastolic CHF, NYHA class 1 ([place]) 12/01/2013  . Erectile dysfunction 12/01/2013  . HTN (hypertension) 12/01/2013  . Hyperlipidemia 12/01/2013  . Ischemic cardiomyopathy 12/01/2013  . Sleep apnea    Past Surgical History:  Procedure Laterality Date  . ACHILLES TENDON REPAIR     lft foot  . CARDIAC CATHETERIZATION N/A 05/05/2016   Procedure: Left Heart Cath and Coronary Angiography;  Surgeon: Leonie Man, MD;  Location: [facility];  Service: Cardiovascular;  Laterality: N/A;  . CARDIAC DEFIBRILLATOR PLACEMENT    . WRIST TENODESIS      Current Outpatient Prescriptions  Medication Sig Dispense Refill  . amiodarone (PACERONE) 200 MG tablet Take 1 tablet (200 mg total) by mouth 2 (two) times daily. 180 tablet 3  . amLODipine (NORVASC) 10 MG tablet Take 1 tablet (10 mg total) by mouth daily. 30 tablet 11  . aspirin 81 MG tablet Take 81 mg by mouth daily.    Marland Kitchen atorvastatin (LIPITOR) 80 MG tablet Take 80 mg by mouth daily. In the evening    .  carvedilol (COREG CR) 80 MG 24 hr capsule Take 1 capsule (80 mg total) by mouth daily. 30 capsule 11  . clopidogrel (PLAVIX) 75 MG tablet TAKE 1 TABLET BY MOUTH EVERY DAY WITH BREAKFAST 90 tablet 1  . ENTRESTO 97-103 MG TAKE 1 TABLET BY MOUTH TWICE DAILY( AFTER TAKING THE 49/52 MG FOR 2 WEEKS) 60 tablet 5  . hydrochlorothiazide (HYDRODIURIL) 25 MG tablet TAKE 1 TABLET BY MOUTH EVERY DAY 90 tablet 2  . ibuprofen (ADVIL,MOTRIN) 400 MG tablet Take 1 tablet (400 mg total) by mouth every 6 (six) hours as needed for moderate pain. 30 tablet 0  . potassium chloride (K-DUR,KLOR-CON) 10 MEQ tablet TAKE 1 TABLET BY MOUTH EVERY DAY 90 tablet 3  . VIAGRA 50 MG tablet Take 1 tablet (50 mg total) by mouth as needed for erectile dysfunction. 30 tablet 0   No current facility-administered medications for this visit.     Allergies:   Patient has no known allergies.   Social History: Social History   Social History  . Marital status: Single    Spouse name: N/A  . Number of children: N/A  . Years of education: N/A   Occupational History  . Not on file.   Social History Main Topics  . Smoking status: Former Smoker    Packs/day: 0.50    Years: 30.00    Types: Cigarettes    Quit date: 05/31/2013  . Smokeless tobacco: Never Used  . Alcohol use No     Comment: ocassionally  .  Drug use: Unknown  . Sexual activity: Not on file   Other Topics Concern  . Not on file   Social History Narrative  . No narrative on file    Family History: Family History  Problem Relation Age of Onset  . Heart disease Mother   . Brain cancer Father     Review of Systems: All other systems reviewed and are otherwise negative except as noted above.   Physical Exam: VS:  BP 124/76   Pulse (!) 59   Resp 20   Wt 291 lb (132 kg)   SpO2 97%   BMI 33.63 kg/m  , BMI Body mass index is 33.63 kg/m.  GEN- The patient is well appearing, alert and oriented x 3 today.   HEENT: normocephalic, atraumatic; sclera  clear, conjunctiva pink; hearing intact; oropharynx clear; neck supple  Lungs- Clear to ausculation bilaterally, normal work of breathing.  No wheezes, rales, rhonchi Heart- Regular rate and rhythm  GI- soft, non-tender, non-distended, bowel sounds present  Extremities- no clubbing, cyanosis, or edema  MS- no significant deformity or atrophy Skin- warm and dry, no rash or lesion; ICD pocket well healed Psych- euthymic mood, full affect Neuro- strength and sensation are intact  ICD interrogation- reviewed in detail today,  See PACEART report  EKG:  EKG is not ordered today.  Recent Labs: 05/04/2016: Magnesium 2.0 05/05/2016: Hemoglobin 13.3; Platelets 165 08/26/2016: ALT 21; BUN 12; Creat 1.02; Potassium 5.3; Sodium 140; TSH 2.24   Wt Readings from Last 3 Encounters:  12/03/16 291 lb (132 kg)  08/26/16 280 lb (127 kg)  06/10/16 277 lb 12.8 oz (126 kg)     Other studies Reviewed: Additional studies/ records that were reviewed today include: Dr Caryl Comes and Dr Croitoru's office notes  Assessment and Plan:  1.  Chronic systolic dysfunction euvolemic today Stable on an appropriate medical regimen ICD at [facility]. His RA lead also has known elevated thresholds but is functioning today. I have reviewed risks, benefits for generator change as well as atrial lead revision. Will let Dr Caryl Comes and patient make final decision about lead revision at time of gen change. Risks, benefits to generator change and atrial lead revision reviewed with the patient who wishes to proceed. Will schedule with Dr Caryl Comes at the next available time.    Hold Plavix for 2 days prior to generator change  2.  Ventricular tachycardia Increase in burden of ATP treated episodes and NSVT episodes since Mexiletine discontinued For now, will increase amiodarone to 200mg  twice daily and re-evaluate at time of generator change Recent LFT's, TSH stable   3.  ICM/CAD No recent ischemic symptoms Continue current therapy  4.   HTN Stable No change required today   Current medicines are reviewed at length with the patient today.   The patient does not have concerns regarding his medicines.  The following changes were made today:  none  Labs/ tests ordered today include: pre-procedure labs Orders Placed This Encounter  Procedures  . Basic metabolic panel  . CBC     Disposition:   Follow up with Dr Caryl Comes after generator change and lead revision    Signed, Chanetta Marshall, NP 12/03/2016 10:28 AM  [address] [place] [address] [phone] (office) [phone] (fax

## 2016-12-03 ENCOUNTER — Encounter: Payer: Self-pay | Admitting: Cardiology

## 2016-12-03 ENCOUNTER — Encounter: Payer: Self-pay | Admitting: *Deleted

## 2016-12-03 ENCOUNTER — Encounter: Payer: Self-pay | Admitting: Nurse Practitioner

## 2016-12-03 ENCOUNTER — Ambulatory Visit (INDEPENDENT_AMBULATORY_CARE_PROVIDER_SITE_OTHER): Payer: Worker's Compensation | Admitting: Nurse Practitioner

## 2016-12-03 DIAGNOSIS — I472 Ventricular tachycardia, unspecified: Secondary | ICD-10-CM

## 2016-12-03 DIAGNOSIS — I5022 Chronic systolic (congestive) heart failure: Secondary | ICD-10-CM

## 2016-12-03 DIAGNOSIS — I493 Ventricular premature depolarization: Secondary | ICD-10-CM

## 2016-12-03 DIAGNOSIS — I255 Ischemic cardiomyopathy: Secondary | ICD-10-CM | POA: Diagnosis not present

## 2016-12-03 LAB — CUP PACEART INCLINIC DEVICE CHECK
Implantable Lead Implant Date: 20051120
Implantable Lead Implant Date: 20101117
Implantable Lead Location: 753860
Implantable Pulse Generator Implant Date: 20101117
MDC IDC LEAD LOCATION: 753859
MDC IDC LEAD MODEL: 7121
MDC IDC SESS DTM: 20171220120620

## 2016-12-03 MED ORDER — IBUPROFEN 400 MG PO TABS: 400.0000 mg | ORAL_TABLET | Freq: Four times a day (QID) | ORAL | 0 refills | Status: DC | PRN

## 2016-12-03 MED ORDER — VIAGRA 50 MG PO TABS: 50.0000 mg | ORAL_TABLET | ORAL | 0 refills | Status: DC | PRN

## 2016-12-03 MED ORDER — AMIODARONE HCL 200 MG PO TABS: 200.0000 mg | ORAL_TABLET | Freq: Two times a day (BID) | ORAL | 3 refills | Status: DC

## 2016-12-03 NOTE — Patient Instructions (Addendum)
Medication Instructions:    START TAKING AMIODARONE  200 MG TWICE A DAY   If you need a refill on your cardiac medications before your next appointment, please call your pharmacy.  Labwork: CBC AND BMET  RETURN FOR LAB WORK ON 12/12/2016    Testing/Procedures: SEE LETTER FOR  GEN CHANGE ON  12/19/2016    Follow-Up: 10  POST  WOUND CHECK WITH DEVICE CLINIC ON 12/19/2016                     90 DAYS AFTER 12/19/2016 PHYS  DEFIB [place]  WITH DR Caryl Comes    Any Other Special Instructions Will Be Listed Below (If Applicable).

## 2016-12-03 NOTE — Progress Notes (Signed)
Thank you again [facility]

## 2016-12-12 ENCOUNTER — Other Ambulatory Visit: Payer: Self-pay

## 2016-12-12 ENCOUNTER — Other Ambulatory Visit: Payer: Self-pay | Admitting: *Deleted

## 2016-12-12 DIAGNOSIS — I5022 Chronic systolic (congestive) heart failure: Secondary | ICD-10-CM

## 2016-12-12 DIAGNOSIS — I428 Other cardiomyopathies: Secondary | ICD-10-CM

## 2016-12-12 LAB — BASIC METABOLIC PANEL
BUN: 16 mg/dL (ref 7–25)
CALCIUM: 9.1 mg/dL (ref 8.6–10.3)
CHLORIDE: 103 mmol/L (ref 98–110)
CO2: 28 mmol/L (ref 20–31)
Creat: 1.08 mg/dL (ref 0.70–1.33)
GLUCOSE: 115 mg/dL — AB (ref 65–99)
POTASSIUM: 5.1 mmol/L (ref 3.5–5.3)
SODIUM: 139 mmol/L (ref 135–146)

## 2016-12-19 ENCOUNTER — Encounter (HOSPITAL_COMMUNITY)
Admission: RE | Disposition: A | Discharge status: Home / Self Care | Payer: Self-pay | Source: Ambulatory Visit | Attending: Cardiovascular Disease

## 2016-12-19 ENCOUNTER — Ambulatory Visit (HOSPITAL_COMMUNITY)
Admission: RE | Admit: 2016-12-19 | Discharge: 2016-12-19 | Disposition: A | Discharge status: Home / Self Care | Payer: Worker's Compensation | Source: Ambulatory Visit | Attending: Cardiovascular Disease | Admitting: Cardiovascular Disease

## 2016-12-19 DIAGNOSIS — I5042 Chronic combined systolic (congestive) and diastolic (congestive) heart failure: Secondary | ICD-10-CM | POA: Diagnosis not present

## 2016-12-19 DIAGNOSIS — Z7982 Long term (current) use of aspirin: Secondary | ICD-10-CM | POA: Insufficient documentation

## 2016-12-19 DIAGNOSIS — E785 Hyperlipidemia, unspecified: Secondary | ICD-10-CM | POA: Diagnosis not present

## 2016-12-19 DIAGNOSIS — I2510 Atherosclerotic heart disease of native coronary artery without angina pectoris: Secondary | ICD-10-CM | POA: Insufficient documentation

## 2016-12-19 DIAGNOSIS — Z7902 Long term (current) use of antithrombotics/antiplatelets: Secondary | ICD-10-CM | POA: Diagnosis not present

## 2016-12-19 DIAGNOSIS — G4730 Sleep apnea, unspecified: Secondary | ICD-10-CM | POA: Insufficient documentation

## 2016-12-19 DIAGNOSIS — I472 Ventricular tachycardia, unspecified: Secondary | ICD-10-CM

## 2016-12-19 DIAGNOSIS — Z4502 Encounter for adjustment and management of automatic implantable cardiac defibrillator: Secondary | ICD-10-CM | POA: Diagnosis not present

## 2016-12-19 DIAGNOSIS — I110 Hypertensive heart disease with heart failure: Secondary | ICD-10-CM | POA: Insufficient documentation

## 2016-12-19 DIAGNOSIS — Z87891 Personal history of nicotine dependence: Secondary | ICD-10-CM | POA: Insufficient documentation

## 2016-12-19 DIAGNOSIS — Z8249 Family history of ischemic heart disease and other diseases of the circulatory system: Secondary | ICD-10-CM | POA: Diagnosis not present

## 2016-12-19 DIAGNOSIS — Z95810 Presence of automatic (implantable) cardiac defibrillator: Secondary | ICD-10-CM | POA: Diagnosis present

## 2016-12-19 DIAGNOSIS — I255 Ischemic cardiomyopathy: Secondary | ICD-10-CM | POA: Diagnosis not present

## 2016-12-19 DIAGNOSIS — Z808 Family history of malignant neoplasm of other organs or systems: Secondary | ICD-10-CM | POA: Insufficient documentation

## 2016-12-19 DIAGNOSIS — I429 Cardiomyopathy, unspecified: Secondary | ICD-10-CM | POA: Diagnosis not present

## 2016-12-19 HISTORY — PX: EP IMPLANTABLE DEVICE: SHX172B

## 2016-12-19 LAB — CBC
HCT: 40.7 % (ref 39.0–52.0)
Hemoglobin: 13.9 g/dL (ref 13.0–17.0)
MCH: 31 pg (ref 26.0–34.0)
MCHC: 34.2 g/dL (ref 30.0–36.0)
MCV: 90.6 fL (ref 78.0–100.0)
PLATELETS: 234 10*3/uL (ref 150–400)
RBC: 4.49 MIL/uL (ref 4.22–5.81)
RDW: 13.2 % (ref 11.5–15.5)
WBC: 9.5 10*3/uL (ref 4.0–10.5)

## 2016-12-19 LAB — SURGICAL PCR SCREEN
MRSA, PCR: NEGATIVE
Staphylococcus aureus: NEGATIVE

## 2016-12-19 SURGERY — ICD GENERATOR CHANGEOUT

## 2016-12-19 MED ORDER — SODIUM CHLORIDE 0.9 % IV SOLN: INTRAVENOUS | Status: DC

## 2016-12-19 MED ORDER — CHLORHEXIDINE GLUCONATE 4 % EX LIQD: 60.0000 mL | Freq: Once | CUTANEOUS | Status: DC

## 2016-12-19 MED ORDER — ACETAMINOPHEN 325 MG PO TABS: 325.0000 mg | ORAL_TABLET | ORAL | Status: DC | PRN

## 2016-12-19 MED ORDER — MIDAZOLAM HCL 5 MG/5ML IJ SOLN
INTRAMUSCULAR | Status: AC
  Filled 2016-12-19: qty 5

## 2016-12-19 MED ORDER — MIDAZOLAM HCL 5 MG/5ML IJ SOLN
INTRAMUSCULAR | Status: DC | PRN
  Administered 2016-12-19 (×2): 2 mg via INTRAVENOUS

## 2016-12-19 MED ORDER — SODIUM CHLORIDE 0.9 % IV SOLN
INTRAVENOUS | Status: DC
  Administered 2016-12-19 (×2): via INTRAVENOUS

## 2016-12-19 MED ORDER — LIDOCAINE HCL (PF) 1 % IJ SOLN
INTRAMUSCULAR | Status: AC
  Filled 2016-12-19: qty 30

## 2016-12-19 MED ORDER — GENTAMICIN SULFATE 40 MG/ML IJ SOLN
INTRAMUSCULAR | Status: AC
  Filled 2016-12-19: qty 2

## 2016-12-19 MED ORDER — SODIUM CHLORIDE 0.9 % IR SOLN
80.0000 mg | Status: AC
  Administered 2016-12-19: 80 mg

## 2016-12-19 MED ORDER — FENTANYL CITRATE (PF) 100 MCG/2ML IJ SOLN
INTRAMUSCULAR | Status: DC | PRN
  Administered 2016-12-19: 25 ug via INTRAVENOUS
  Administered 2016-12-19: 50 ug via INTRAVENOUS

## 2016-12-19 MED ORDER — LIDOCAINE HCL (PF) 1 % IJ SOLN
INTRAMUSCULAR | Status: DC | PRN
  Administered 2016-12-19: 60 mL

## 2016-12-19 MED ORDER — MUPIROCIN 2 % EX OINT
1.0000 "application " | TOPICAL_OINTMENT | Freq: Once | CUTANEOUS | Status: AC
  Administered 2016-12-19: 1 via TOPICAL

## 2016-12-19 MED ORDER — FENTANYL CITRATE (PF) 100 MCG/2ML IJ SOLN
INTRAMUSCULAR | Status: AC
  Filled 2016-12-19: qty 2

## 2016-12-19 MED ORDER — DEXTROSE 5 % IV SOLN
3.0000 g | INTRAVENOUS | Status: AC
  Administered 2016-12-19: 3 g via INTRAVENOUS
  Filled 2016-12-19: qty 3000

## 2016-12-19 MED ORDER — MUPIROCIN 2 % EX OINT
TOPICAL_OINTMENT | CUTANEOUS | Status: AC
  Administered 2016-12-19: 1 via TOPICAL
  Filled 2016-12-19: qty 22

## 2016-12-19 MED ORDER — SODIUM CHLORIDE 0.9 % IV SOLN: INTRAVENOUS | Status: AC

## 2016-12-19 MED ORDER — ONDANSETRON HCL 4 MG/2ML IJ SOLN: 4.0000 mg | Freq: Four times a day (QID) | INTRAMUSCULAR | Status: DC | PRN

## 2016-12-19 SURGICAL SUPPLY — 4 items
CABLE SURGICAL S-101-97-12 (CABLE) ×3 IMPLANT
ICD EVERA XT DR DDBB1D1 (ICD Generator) ×3 IMPLANT
PAD DEFIB LIFELINK (PAD) ×3 IMPLANT
TRAY PACEMAKER INSERTION (PACKS) ×3 IMPLANT

## 2016-12-19 NOTE — Discharge Instructions (Signed)
Pacemaker Battery Change, Care After °Refer to this sheet in the next few weeks. These instructions provide you with information on caring for yourself after your procedure. Your health care provider may also give you more specific instructions. Your treatment has been planned according to current medical practices, but problems sometimes occur. Call your health care provider if you have any problems or questions after your procedure. °WHAT TO EXPECT AFTER THE PROCEDURE °After your procedure, it is typical to have the following sensations: °· Soreness at the pacemaker site. °HOME CARE INSTRUCTIONS  °· Keep the incision clean and dry. °· Unless advised otherwise, you may shower beginning 48 hours after your procedure. °· For the first week after the replacement, avoid stretching motions that pull at the incision site, and avoid heavy exercise with the arm that is on the same side as the incision. °· Take medicines only as directed by your health care provider. °· Keep all follow-up visits as directed by your health care provider. °SEEK MEDICAL CARE IF:  °· You have pain at the incision site that is not relieved by over-the-counter or prescription medicine. °· There is drainage or pus from the incision site. °· There is swelling larger than a lime at the incision site. °· You develop red streaking that extends above or below the incision site. °· You feel brief, intermittent palpitations, light-headedness, or any symptoms that you feel might be related to your heart. °SEEK IMMEDIATE MEDICAL CARE IF:  °· You experience chest pain that is different than the pain at the pacemaker site. °· You experience shortness of breath. °· You have palpitations or irregular heartbeat. °· You have light-headedness that does not go away quickly. °· You faint. °· You have pain that gets worse and is not relieved by medicine. °This information is not intended to replace advice given to you by your health care provider. Make sure you  discuss any questions you have with your health care provider. °Document Released: 09/21/2013 Document Revised: 12/22/2014 Document Reviewed: 09/21/2013 °Elsevier Interactive Patient Education © 2017 Elsevier Inc. ° °

## 2016-12-19 NOTE — Interval H&P Note (Signed)
History and Physical Interval Note:  12/19/2016 2:17 PM  Gregory [name]  has presented today for surgery, with the diagnosis of eri  The various methods of treatment have been discussed with the patient and family. After consideration of risks, benefits and other options for treatment, the patient has consented to  Procedure(s): ICD [employer] (N/A) as a surgical intervention .  The patient's history has been reviewed, patient examined, no change in status, stable for surgery.  I have reviewed the patient's chart and labs.  Questions were answered to the patient's satisfaction.     Virl Axe  Pt seen and examined.  Questions answered.  We will not plan on atrial lead revision as he uses the lead less than 5% of the time. We have reviewed the benefits and risks of generator replacement.  These include but are not limited to lead fracture and infection.  The patient understands, agrees and is willing to proceed.

## 2016-12-19 NOTE — H&P (View-Only) (Signed)
Electrophysiology Office Note Date: 12/03/2016  ID:  Gregory, [name] [birth year], MRN [id number]  PCP: Pcp Not In System Primary Cardiologist: Croitoru Electrophysiologist: Caryl Comes  CC: ICD at [place] is a 56 y.o. male seen today for Dr Caryl Comes.  He presents today for routine electrophysiology followup.  Since last being seen in our clinic, the patient reports doing very well. He has had some increase in tachy-palpitations but no ICD shocks. He denies chest pain, dyspnea, PND, orthopnea, nausea, vomiting, dizziness, syncope, edema, weight gain, or early satiety.  He has not had ICD shocks.   Device History: MDT dual chamber ICD implanted 2010 for ICM History of appropriate therapy: Yes History of AAD therapy: Yes - amiodarone and Mexiletine (Mexiletine discontinued 05/2016)   Past Medical History:  Diagnosis Date  . CAD (coronary artery disease) 12/01/2013  . Chronic combined systolic and diastolic CHF, NYHA class 1 ([place]) 12/01/2013  . Erectile dysfunction 12/01/2013  . HTN (hypertension) 12/01/2013  . Hyperlipidemia 12/01/2013  . Ischemic cardiomyopathy 12/01/2013  . Sleep apnea    Past Surgical History:  Procedure Laterality Date  . ACHILLES TENDON REPAIR     lft foot  . CARDIAC CATHETERIZATION N/A 05/05/2016   Procedure: Left Heart Cath and Coronary Angiography;  Surgeon: Leonie Man, MD;  Location: [facility];  Service: Cardiovascular;  Laterality: N/A;  . CARDIAC DEFIBRILLATOR PLACEMENT    . WRIST TENODESIS      Current Outpatient Prescriptions  Medication Sig Dispense Refill  . amiodarone (PACERONE) 200 MG tablet Take 1 tablet (200 mg total) by mouth 2 (two) times daily. 180 tablet 3  . amLODipine (NORVASC) 10 MG tablet Take 1 tablet (10 mg total) by mouth daily. 30 tablet 11  . aspirin 81 MG tablet Take 81 mg by mouth daily.    Marland Kitchen atorvastatin (LIPITOR) 80 MG tablet Take 80 mg by mouth daily. In the evening    .  carvedilol (COREG CR) 80 MG 24 hr capsule Take 1 capsule (80 mg total) by mouth daily. 30 capsule 11  . clopidogrel (PLAVIX) 75 MG tablet TAKE 1 TABLET BY MOUTH EVERY DAY WITH BREAKFAST 90 tablet 1  . ENTRESTO 97-103 MG TAKE 1 TABLET BY MOUTH TWICE DAILY( AFTER TAKING THE 49/52 MG FOR 2 WEEKS) 60 tablet 5  . hydrochlorothiazide (HYDRODIURIL) 25 MG tablet TAKE 1 TABLET BY MOUTH EVERY DAY 90 tablet 2  . ibuprofen (ADVIL,MOTRIN) 400 MG tablet Take 1 tablet (400 mg total) by mouth every 6 (six) hours as needed for moderate pain. 30 tablet 0  . potassium chloride (K-DUR,KLOR-CON) 10 MEQ tablet TAKE 1 TABLET BY MOUTH EVERY DAY 90 tablet 3  . VIAGRA 50 MG tablet Take 1 tablet (50 mg total) by mouth as needed for erectile dysfunction. 30 tablet 0   No current facility-administered medications for this visit.     Allergies:   Patient has no known allergies.   Social History: Social History   Social History  . Marital status: Single    Spouse name: N/A  . Number of children: N/A  . Years of education: N/A   Occupational History  . Not on file.   Social History Main Topics  . Smoking status: Former Smoker    Packs/day: 0.50    Years: 30.00    Types: Cigarettes    Quit date: 05/31/2013  . Smokeless tobacco: Never Used  . Alcohol use No     Comment: ocassionally  .  Drug use: Unknown  . Sexual activity: Not on file   Other Topics Concern  . Not on file   Social History Narrative  . No narrative on file    Family History: Family History  Problem Relation Age of Onset  . Heart disease Mother   . Brain cancer Father     Review of Systems: All other systems reviewed and are otherwise negative except as noted above.   Physical Exam: VS:  BP 124/76   Pulse (!) 59   Resp 20   Wt 291 lb (132 kg)   SpO2 97%   BMI 33.63 kg/m  , BMI Body mass index is 33.63 kg/m.  GEN- The patient is well appearing, alert and oriented x 3 today.   HEENT: normocephalic, atraumatic; sclera  clear, conjunctiva pink; hearing intact; oropharynx clear; neck supple  Lungs- Clear to ausculation bilaterally, normal work of breathing.  No wheezes, rales, rhonchi Heart- Regular rate and rhythm  GI- soft, non-tender, non-distended, bowel sounds present  Extremities- no clubbing, cyanosis, or edema  MS- no significant deformity or atrophy Skin- warm and dry, no rash or lesion; ICD pocket well healed Psych- euthymic mood, full affect Neuro- strength and sensation are intact  ICD interrogation- reviewed in detail today,  See PACEART report  EKG:  EKG is not ordered today.  Recent Labs: 05/04/2016: Magnesium 2.0 05/05/2016: Hemoglobin 13.3; Platelets 165 08/26/2016: ALT 21; BUN 12; Creat 1.02; Potassium 5.3; Sodium 140; TSH 2.24   Wt Readings from Last 3 Encounters:  12/03/16 291 lb (132 kg)  08/26/16 280 lb (127 kg)  06/10/16 277 lb 12.8 oz (126 kg)     Other studies Reviewed: Additional studies/ records that were reviewed today include: Dr Caryl Comes and Dr Croitoru's office notes  Assessment and Plan:  1.  Chronic systolic dysfunction euvolemic today Stable on an appropriate medical regimen ICD at [facility]. His RA lead also has known elevated thresholds but is functioning today. I have reviewed risks, benefits for generator change as well as atrial lead revision. Will let Dr Caryl Comes and patient make final decision about lead revision at time of gen change. Risks, benefits to generator change and atrial lead revision reviewed with the patient who wishes to proceed. Will schedule with Dr Caryl Comes at the next available time.    Hold Plavix for 2 days prior to generator change  2.  Ventricular tachycardia Increase in burden of ATP treated episodes and NSVT episodes since Mexiletine discontinued For now, will increase amiodarone to 200mg  twice daily and re-evaluate at time of generator change Recent LFT's, TSH stable   3.  ICM/CAD No recent ischemic symptoms Continue current therapy  4.   HTN Stable No change required today   Current medicines are reviewed at length with the patient today.   The patient does not have concerns regarding his medicines.  The following changes were made today:  none  Labs/ tests ordered today include: pre-procedure labs Orders Placed This Encounter  Procedures  . Basic metabolic panel  . CBC     Disposition:   Follow up with Dr Caryl Comes after generator change and lead revision    Signed, Chanetta Marshall, NP 12/03/2016 10:28 AM  [address] [place] [address] [phone] (office) [phone] (fax

## 2016-12-19 NOTE — Progress Notes (Signed)
ICD Criteria  Current LVEF:60%. Within 12 months prior to implant: Yes   Heart failure history: Yes, Class II  Cardiomyopathy history: Yes, Non-Ischemic Cardiomyopathy.  Atrial Fibrillation/Atrial Flutter: No.  Ventricular tachycardia history: Yes, Hemodynamic instability present. VT Type: Sustained Ventricular Tachycardia - Polymorphic.  Cardiac arrest history: No.  History of syndromes with risk of sudden death: No.  Previous ICD: Yes, Reason for ICD:  Primary prevention.  Current ICD indication: Secondary  PPM indication: No.   Class I or II Bradycardia indication present: No  Beta Blocker therapy for 3 or more months: Yes, prescribed.   Ace Inhibitor/ARB therapy for 3 or more months: Yes, prescribed.

## 2016-12-22 ENCOUNTER — Encounter (HOSPITAL_COMMUNITY): Payer: Self-pay | Admitting: Internal Medicine

## 2016-12-22 ENCOUNTER — Telehealth: Payer: Self-pay | Admitting: Internal Medicine

## 2016-12-22 NOTE — Telephone Encounter (Signed)
I spoke with the patient and he is aware of his CBC/ MRSA screening results from his procedure.

## 2016-12-22 NOTE — Telephone Encounter (Signed)
New message   Pt verbalized that he is calling for results for labs

## 2016-12-23 MED FILL — Gentamicin Sulfate Inj 40 MG/ML: INTRAMUSCULAR | Qty: 80 | Status: AC

## 2016-12-29 ENCOUNTER — Ambulatory Visit (INDEPENDENT_AMBULATORY_CARE_PROVIDER_SITE_OTHER): Payer: Worker's Compensation | Admitting: *Deleted

## 2016-12-29 DIAGNOSIS — Z95810 Presence of automatic (implantable) cardiac defibrillator: Secondary | ICD-10-CM | POA: Diagnosis not present

## 2016-12-29 NOTE — Progress Notes (Signed)
Wound check appointment. Dermabond removed. Wound without redness or edema. Incision edges approximated, wound well healed. Normal device function. Thresholds, sensing, and impedances consistent with implant measurements. Device programmed at chronic outputs. Histogram distribution appropriate for patient and level of activity. No mode switches or ventricular arrhythmias noted. Patient educated about wound care, arm mobility, shock plan. ROV 03/31/2017 w/ SK

## 2017-01-01 ENCOUNTER — Other Ambulatory Visit: Payer: Self-pay | Admitting: Cardiovascular Disease

## 2017-01-02 ENCOUNTER — Other Ambulatory Visit: Payer: Self-pay | Admitting: *Deleted

## 2017-01-02 MED ORDER — SILDENAFIL CITRATE 50 MG PO TABS: 50.0000 mg | ORAL_TABLET | ORAL | 0 refills | Status: DC | PRN

## 2017-01-12 ENCOUNTER — Other Ambulatory Visit: Payer: Self-pay | Admitting: Cardiovascular Disease

## 2017-01-27 ENCOUNTER — Other Ambulatory Visit: Payer: Self-pay | Admitting: Cardiovascular Disease

## 2017-01-28 NOTE — Telephone Encounter (Signed)
Rx(s) sent to pharmacy electronically.  

## 2017-02-02 ENCOUNTER — Other Ambulatory Visit: Payer: Self-pay | Admitting: Cardiovascular Disease

## 2017-02-26 ENCOUNTER — Other Ambulatory Visit: Payer: Self-pay | Admitting: Cardiology

## 2017-03-02 ENCOUNTER — Other Ambulatory Visit: Payer: Self-pay | Admitting: Cardiovascular Disease

## 2017-03-03 ENCOUNTER — Telehealth: Payer: Self-pay | Admitting: Cardiovascular Disease

## 2017-03-03 NOTE — Telephone Encounter (Signed)
New Message      *STAT* If patient is at the pharmacy, call can be transferred to refill team.   1. Which medications need to be refilled? (please list name of each medication and dose if known)  VIAGRA 50 MG tablet TAKE 1 TABLET BY MOUTH AS NEEDED FOR ERECTILE DYSFUNCTION     2. Which pharmacy/location (including street and city if local pharmacy) is medication to be sent to? [facility] on [address]  3. Do they need a 30 day or 90 day supply? [place]

## 2017-03-03 NOTE — Telephone Encounter (Signed)
Returned call to patient.He stated he wants a new cpap machine.Stated he has had his for 12 years.He was living in [place] when he got it and has not seen a sleep Dr.since.Advised Dr.Croitoru does do sleep apnea.He needs appointment with Dr.Kelly who is our sleep apnea Dr.Advised our scheduler will call back with  appointment with Dr.Kelly.

## 2017-03-03 NOTE — Telephone Encounter (Signed)
Agree thanks MCr 

## 2017-03-03 NOTE — Telephone Encounter (Signed)
New Message     Pt needs a prescription for a new CPAP machine

## 2017-03-03 NOTE — Telephone Encounter (Signed)
Yes,  refill 

## 2017-03-04 MED ORDER — VIAGRA 50 MG PO TABS: ORAL_TABLET | ORAL | 0 refills | Status: DC

## 2017-03-04 NOTE — Telephone Encounter (Signed)
Rx(s) sent to pharmacy electronically.  

## 2017-03-13 ENCOUNTER — Other Ambulatory Visit: Payer: Self-pay | Admitting: Cardiovascular Disease

## 2017-03-13 NOTE — Telephone Encounter (Signed)
Rx request sent to pharmacy.  

## 2017-03-24 ENCOUNTER — Other Ambulatory Visit: Payer: Self-pay | Admitting: Cardiovascular Disease

## 2017-03-25 NOTE — Telephone Encounter (Signed)
Rx(s) sent to pharmacy electronically.  

## 2017-03-31 ENCOUNTER — Encounter: Payer: Self-pay | Admitting: Internal Medicine

## 2017-03-31 ENCOUNTER — Ambulatory Visit (INDEPENDENT_AMBULATORY_CARE_PROVIDER_SITE_OTHER): Payer: Worker's Compensation | Admitting: Internal Medicine

## 2017-03-31 ENCOUNTER — Encounter (INDEPENDENT_AMBULATORY_CARE_PROVIDER_SITE_OTHER): Payer: Self-pay

## 2017-03-31 VITALS — BP 114/84 | HR 75 | Ht 78.0 in | Wt 291.0 lb

## 2017-03-31 DIAGNOSIS — I5022 Chronic systolic (congestive) heart failure: Secondary | ICD-10-CM | POA: Diagnosis not present

## 2017-03-31 DIAGNOSIS — I255 Ischemic cardiomyopathy: Secondary | ICD-10-CM

## 2017-03-31 DIAGNOSIS — I472 Ventricular tachycardia, unspecified: Secondary | ICD-10-CM

## 2017-03-31 DIAGNOSIS — Z79899 Other long term (current) drug therapy: Secondary | ICD-10-CM

## 2017-03-31 DIAGNOSIS — Z95810 Presence of automatic (implantable) cardiac defibrillator: Secondary | ICD-10-CM

## 2017-03-31 DIAGNOSIS — I493 Ventricular premature depolarization: Secondary | ICD-10-CM

## 2017-03-31 LAB — CUP PACEART INCLINIC DEVICE CHECK
Battery Remaining Longevity: 122 mo
Battery Voltage: 3.08 V
Brady Statistic AP VP Percent: 0.36 %
Brady Statistic AP VS Percent: 42.12 %
Brady Statistic AS VS Percent: 57.43 %
Date Time Interrogation Session: 20180417202826
HighPow Impedance: 51 Ohm
HighPow Impedance: 65 Ohm
Implantable Lead Implant Date: 20051120
Implantable Lead Location: 753859
Implantable Lead Location: 753860
Implantable Lead Model: 5076
Lead Channel Impedance Value: 475 Ohm
Lead Channel Impedance Value: 475 Ohm
Lead Channel Pacing Threshold Amplitude: 0.625 V
Lead Channel Pacing Threshold Pulse Width: 0.4 ms
Lead Channel Pacing Threshold Pulse Width: 0.4 ms
Lead Channel Sensing Intrinsic Amplitude: 4.625 mV
Lead Channel Sensing Intrinsic Amplitude: 4.75 mV
MDC IDC LEAD IMPLANT DT: 20101117
MDC IDC MSMT LEADCHNL RV IMPEDANCE VALUE: 361 Ohm
MDC IDC MSMT LEADCHNL RV PACING THRESHOLD AMPLITUDE: 1.125 V
MDC IDC MSMT LEADCHNL RV SENSING INTR AMPL: 15.875 mV
MDC IDC MSMT LEADCHNL RV SENSING INTR AMPL: 18.75 mV
MDC IDC PG IMPLANT DT: 20180105
MDC IDC SET LEADCHNL RA PACING AMPLITUDE: 2 V
MDC IDC SET LEADCHNL RV PACING AMPLITUDE: 2.25 V
MDC IDC SET LEADCHNL RV PACING PULSEWIDTH: 0.4 ms
MDC IDC SET LEADCHNL RV SENSING SENSITIVITY: 0.6 mV
MDC IDC STAT BRADY AS VP PERCENT: 0.09 %
MDC IDC STAT BRADY RA PERCENT PACED: 42.2 %
MDC IDC STAT BRADY RV PERCENT PACED: 0.48 %

## 2017-03-31 NOTE — Patient Instructions (Addendum)
Medication Instructions: - Your physician recommends that you continue on your current medications as directed. Please refer to the Current Medication list given to you today.  Labwork: - Your physician recommends that you return for lab work : Monday 04/06/17- TSH/ Liver  Lab hours are from 7:30 am- 4:45 pm  Procedures/Testing: - none ordered  Follow-Up: - Remote monitoring is used to monitor your Pacemaker of ICD from home. This monitoring reduces the number of office visits required to check your device to one time per year. It allows [place] to keep an eye on the functioning of your device to ensure it is working properly. You are scheduled for a device check from home on 06/30/17. You may send your transmission at any time that day. If you have a wireless device, the transmission will be sent automatically. After your physician reviews your transmission, you will receive a postcard with your next transmission date.  - Your physician wants you to follow-up in: 4 months with Dr. Timmie Foerster & 8 months with Dr. Caryl Comes. You will receive a reminder letter in the mail two months in advance. If you don't receive a letter, please call our office to schedule the follow-up appointment.    Any Additional Special Instructions Will Be Listed Below (If Applicable).     If you need a refill on your cardiac medications before your next appointment, please call your pharmacy.

## 2017-03-31 NOTE — Progress Notes (Signed)
Patient Care Team: No Pcp Per Patient as PCP - General (General Practice)   HPI  Gregory [name] is a 56 y.o. male Seen in followup for ventricular tachycardia associated with a previously implanted ICD in the context of congestive heart failure ischemic cardiomyopathy. Ejection fraction echo 2014--45-50% and Myoview 2010 extensive inferolateral scar. He had a type I have talked with  underwent ICD generator replacement 1/18  He presented 5/17 not feeling good. He was found to be in sustained monomorphic ventricular tachycardia rate of 160 below his detection rate. His device was reprogrammed to 150.  Cath 5/17  Cath Ost RCA to Dist RCA lesion, 100% stenosed. Known occluded. RPDA fills via collaterals with minimal filling of PL system 1. Post Atrio lesion, 80% stenosed. 2. Lat 1st Mrg lesion, 100% stenosed. Appears to be jailed from prior stent. Fills via collaterals 3. Widely patent stent in OM1, D1 and mid LAD 4. There is mild to moderate left ventricular systolic dysfunction. Severely elevated LVEDP  He's been managed by combination of amiodarone and mexiletine, the latter stopped 2/2 flatus  Tolerating higher doses of amio, no interval VT  Date TSH LFTs K  5/17      3.3  9/17  2.24 21 5.1     Device was reprogrammed to a detection rate of 150.      Past Medical History:  Diagnosis Date  . CAD (coronary artery disease) 12/01/2013  . Chronic combined systolic and diastolic CHF, NYHA class 1 ([place]) 12/01/2013  . Erectile dysfunction 12/01/2013  . HTN (hypertension) 12/01/2013  . Hyperlipidemia 12/01/2013  . Ischemic cardiomyopathy 12/01/2013  . Sleep apnea     Past Surgical History:  Procedure Laterality Date  . ACHILLES TENDON REPAIR     lft foot  . CARDIAC CATHETERIZATION N/A 05/05/2016   Procedure: Left Heart Cath and Coronary Angiography;  Surgeon: Leonie Man, MD;  Location: [facility];  Service: Cardiovascular;  Laterality: N/A;    . CARDIAC DEFIBRILLATOR PLACEMENT    . EP IMPLANTABLE DEVICE N/A 12/19/2016   Procedure: ICD Generator Changeout;  Surgeon: [name], MD;  Location: [facility];  Service: Cardiovascular;  Laterality: N/A;  . WRIST TENODESIS      Current Outpatient Prescriptions  Medication Sig Dispense Refill  . amiodarone (PACERONE) 200 MG tablet Take 1 tablet (200 mg total) by mouth 2 (two) times daily. 180 tablet 3  . amLODipine (NORVASC) 10 MG tablet Take 1 tablet (10 mg total) by mouth daily. 30 tablet 11  . aspirin 81 MG tablet Take 81 mg by mouth daily.    Marland Kitchen atorvastatin (LIPITOR) 80 MG tablet Take 80 mg by mouth daily. In the evening    . carvedilol (COREG CR) 80 MG 24 hr capsule Take 1 capsule (80 mg total) by mouth daily. 30 capsule 11  . clopidogrel (PLAVIX) 75 MG tablet TAKE 1 TABLET BY MOUTH EVERY DAY WITH BREAKFAST 90 tablet 1  . hydrochlorothiazide (HYDRODIURIL) 25 MG tablet TAKE 1 TABLET BY MOUTH EVERY DAY 90 tablet 0  . ibuprofen (ADVIL,MOTRIN) 400 MG tablet Take 1 tablet (400 mg total) by mouth every 6 (six) hours as needed for moderate pain. 30 tablet 0  . potassium chloride (K-DUR,KLOR-CON) 10 MEQ tablet TAKE 1 TABLET BY MOUTH EVERY DAY 90 tablet 0  . sacubitril-valsartan (ENTRESTO) 97-103 MG Take 1 tablet by mouth 2 (two) times daily. 60 tablet 8  . sildenafil (VIAGRA) 50 MG tablet TAKE 1  TABLET BY MOUTH AS NEEDED FOR ERECTILE DYSFUNCTION 30 tablet 0   No current facility-administered medications for this visit.     No Known Allergies  Review of Systems negative except from HPI and PMH  Physical Exam BP 114/84   Pulse 75   Ht 6\' 6"  (1.981 m)   Wt 291 lb (132 kg)   SpO2 96%   BMI 33.63 kg/m  Well developed and well nourished in no acute distress HENT normal E scleral and icterus clear Neck Supple JVP flat; carotids brisk and full Clear to ausculation  Regular rate and rhythm, no murmurs gallops or rub Soft with active bowel sounds No clubbing cyanosis    Edema Alert and oriented, grossly normal motor and sensory function Skin Warm and Dry  ECG Personally reviewed   Sinus 63 Intervals 20/12/45 Inferolateral MI   Assessment and  Plan  PVCs few and far between   Ventricular tachycardia    ICD  medtronic  The patient's device was interrogated.  The information was reviewed. No changes were made in the programming.    Ischemic Cardiomyopathy    CHF chronic systolic    No intercurrent Ventricular tachycardia   We will plan to decrease amio 400--300 when he sees Dr Thermon Leyland in about 4 months if no interval VT   Euvolemic continue current meds  Without symptoms of ischemia  Tolerating amio  Check labs

## 2017-04-06 ENCOUNTER — Other Ambulatory Visit: Payer: Worker's Compensation

## 2017-04-06 DIAGNOSIS — I472 Ventricular tachycardia, unspecified: Secondary | ICD-10-CM

## 2017-04-06 DIAGNOSIS — Z79899 Other long term (current) drug therapy: Secondary | ICD-10-CM

## 2017-04-07 LAB — HEPATIC FUNCTION PANEL
ALBUMIN: 4 g/dL (ref 3.5–5.5)
ALK PHOS: 87 IU/L (ref 39–117)
ALT: 25 IU/L (ref 0–44)
AST: 19 IU/L (ref 0–40)
BILIRUBIN, DIRECT: 0.09 mg/dL (ref 0.00–0.40)
Bilirubin Total: 0.3 mg/dL (ref 0.0–1.2)
TOTAL PROTEIN: 6.7 g/dL (ref 6.0–8.5)

## 2017-04-07 LAB — TSH: TSH: 1.64 u[IU]/mL (ref 0.450–4.500)

## 2017-04-13 ENCOUNTER — Encounter: Payer: Self-pay | Admitting: Cardiovascular Disease

## 2017-04-13 ENCOUNTER — Ambulatory Visit (INDEPENDENT_AMBULATORY_CARE_PROVIDER_SITE_OTHER): Payer: Worker's Compensation | Admitting: Cardiovascular Disease

## 2017-04-13 VITALS — BP 130/82 | HR 60 | Ht 78.0 in | Wt 294.0 lb

## 2017-04-13 DIAGNOSIS — I10 Essential (primary) hypertension: Secondary | ICD-10-CM | POA: Diagnosis not present

## 2017-04-13 DIAGNOSIS — Z95810 Presence of automatic (implantable) cardiac defibrillator: Secondary | ICD-10-CM

## 2017-04-13 DIAGNOSIS — Z9861 Coronary angioplasty status: Secondary | ICD-10-CM

## 2017-04-13 DIAGNOSIS — I2510 Atherosclerotic heart disease of native coronary artery without angina pectoris: Secondary | ICD-10-CM

## 2017-04-13 DIAGNOSIS — E6609 Other obesity due to excess calories: Secondary | ICD-10-CM

## 2017-04-13 DIAGNOSIS — I255 Ischemic cardiomyopathy: Secondary | ICD-10-CM

## 2017-04-13 DIAGNOSIS — I472 Ventricular tachycardia, unspecified: Secondary | ICD-10-CM

## 2017-04-13 DIAGNOSIS — G4733 Obstructive sleep apnea (adult) (pediatric): Secondary | ICD-10-CM | POA: Diagnosis not present

## 2017-04-13 DIAGNOSIS — Z9989 Dependence on other enabling machines and devices: Secondary | ICD-10-CM

## 2017-04-13 DIAGNOSIS — Z6833 Body mass index (BMI) 33.0-33.9, adult: Secondary | ICD-10-CM

## 2017-04-13 NOTE — Patient Instructions (Signed)
Your physician has recommended that you have a sleep study. This test records several body functions during sleep, including: brain activity, eye movement, oxygen and carbon dioxide blood levels, heart rate and rhythm, breathing rate and rhythm, the flow of air through your mouth and nose, snoring, body muscle movements, and chest and belly movement. This will be done at [facility].  Your physician recommends that you schedule a follow-up appointment in: 3-4 months with Dr Claiborne Billings in sleep clinic.

## 2017-04-13 NOTE — Progress Notes (Signed)
Cardiology Office Note    Date:  04/20/2017   ID:  Gregory [name], DOB [birth year], MRN [id number]  PCP:  Patient, No Pcp Per  Cardiologist:  Shelva Majestic, MD (sleep); Dr. Sallyanne Kuster  Chief Complaint  Patient presents with  . New Evaluation    SLEEP clinic new patient     History of Present Illness:  Gregory [name] is a 56 y.o. male who is referred for a new sleep evaluation.  He is followed by Dr. Sallyanne Kuster and Dr. Caryl Comes.  Mr. Cancio is originally from [place] and was diagnosed with obstructive sleep apnea over 12 years ago.  During hospitalization at that time, he had sustained VT and ultimately underwent ICD implantation in the context of congestive heart failure and an ischemic cardiomyopathy.  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  He had had recurrent monomorphic ventricular tachycardia and his previous documentation of inferolateral scar.  He underwent repeat catheterization in May 2017 which showed patent stents in the OM1, diagonal and mid LAD, but he had known RCA occlusion with retrograde filling of the PDA and a lateral branch of his marginal artery was subtotally occluded and there was high-grade disease in the PLA, neither of which was amenable to revascularization.  He underwent ICD generator replacement in January 2018.  When he was originally diagnosed with sleep apnea in 2006 his sleep apnea was very severe.  He admits to 100% compliance and has an old RemStar Respironics CPAP machine.  He brought the machine with him to the office today and on interrogation he is set at 12 cm water pressure.  He is at [phone], hours of use.   He uses a nasal mask.  His machine is currently starting to make some noise and beginning to show signs of malfunction.  Previously, he was very symptomatic with nonrestorative sleep, frequent awakenings, loud snoring.  Currently, he goes to bed at 1 AM,  wakes up  7:30 AM.  He cannot sleep without his machine.  He is unaware of bruxism.  He denies any awareness of breakthrough snoring.  As long as he uses CPAP he does not have daytime sleepiness.  He is unaware of restless legs, hypnogognic hallucinations, no cataplexy.  There has been weight gain over the years.  He presents for evaluation.   Epworth Sleepiness Scale: Situation   Chance of Dozing/Sleeping (0 = never , 1 = slight chance , 2 = moderate chance , 3 = high chance )   sitting and reading 0   watching TV 1   sitting inactive in a public place 0   being a passenger in a motor vehicle for an hour or more 0   lying down in the afternoon 3   sitting and talking to someone 0   sitting quietly after lunch (no alcohol) 0   while stopped for a few minutes in traffic as the driver 0   Total Score  4    Past Medical History:  Diagnosis Date  . CAD (coronary artery disease) 12/01/2013  . Chronic combined systolic and diastolic CHF, NYHA class 1 ([place]) 12/01/2013  . Erectile dysfunction 12/01/2013  . HTN (hypertension) 12/01/2013  . Hyperlipidemia 12/01/2013  . Ischemic cardiomyopathy 12/01/2013  . Sleep apnea     Past Surgical History:  Procedure Laterality Date  . ACHILLES TENDON REPAIR     lft foot  . CARDIAC CATHETERIZATION N/A 05/05/2016  Procedure: Left Heart Cath and Coronary Angiography;  Surgeon: Leonie Man, MD;  Location: [facility];  Service: Cardiovascular;  Laterality: N/A;  . CARDIAC DEFIBRILLATOR PLACEMENT    . EP IMPLANTABLE DEVICE N/A 12/19/2016   Procedure: ICD Generator Changeout;  Surgeon: [name], MD;  Location: [facility];  Service: Cardiovascular;  Laterality: N/A;  . WRIST TENODESIS      Current Medications: Outpatient Medications Prior to Visit  Medication Sig Dispense Refill  . amiodarone (PACERONE) 200 MG tablet Take 1 tablet (200 mg total) by mouth 2 (two) times daily. 180 tablet 3  . amLODipine (NORVASC) 10 MG tablet Take  1 tablet (10 mg total) by mouth daily. 30 tablet 11  . aspirin 81 MG tablet Take 81 mg by mouth daily.    Marland Kitchen atorvastatin (LIPITOR) 80 MG tablet Take 80 mg by mouth daily. In the evening    . carvedilol (COREG CR) 80 MG 24 hr capsule Take 1 capsule (80 mg total) by mouth daily. 30 capsule 11  . clopidogrel (PLAVIX) 75 MG tablet TAKE 1 TABLET BY MOUTH EVERY DAY WITH BREAKFAST 90 tablet 1  . ibuprofen (ADVIL,MOTRIN) 400 MG tablet Take 1 tablet (400 mg total) by mouth every 6 (six) hours as needed for moderate pain. 30 tablet 0  . potassium chloride (K-DUR,KLOR-CON) 10 MEQ tablet TAKE 1 TABLET BY MOUTH EVERY DAY 90 tablet 0  . sacubitril-valsartan (ENTRESTO) 97-103 MG Take 1 tablet by mouth 2 (two) times daily. 60 tablet 8  . sildenafil (VIAGRA) 50 MG tablet TAKE 1 TABLET BY MOUTH AS NEEDED FOR ERECTILE DYSFUNCTION 30 tablet 0  . hydrochlorothiazide (HYDRODIURIL) 25 MG tablet TAKE 1 TABLET BY MOUTH EVERY DAY 90 tablet 0   No facility-administered medications prior to visit.      Allergies:   Patient has no known allergies.   Social History   Social History  . Marital status: Single    Spouse name: N/A  . Number of children: N/A  . Years of education: N/A   Social History Main Topics  . Smoking status: Former Smoker    Packs/day: 0.50    Years: 30.00    Types: Cigarettes    Quit date: 05/31/2013  . Smokeless tobacco: Never Used  . Alcohol use No     Comment: ocassionally  . Drug use: Unknown  . Sexual activity: Not Asked   Other Topics Concern  . None   Social History Narrative  . None    Additional social history is notable in that he has 3 children and 2 grandchildren.  Family History:  The patient's family history includes Brain cancer in his father; Heart disease in his mother; Hypertension in his daughter.   ROS General: Negative; No fevers, chills, or night sweats;  HEENT: Negative; No changes in vision or hearing, sinus congestion, difficulty swallowing Pulmonary:  Negative; No cough, wheezing, shortness of breath, hemoptysis Cardiovascular: ischemic cardiomyopathy; h/o VT; s/p ICD GI: Negative; No nausea, vomiting, diarrhea, or abdominal pain GU: Negative; No dysuria, hematuria, or difficulty voiding Musculoskeletal: Negative; no myalgias, joint pain, or weakness Hematologic/Oncology: Negative; no easy bruising, bleeding Endocrine: Negative; no heat/cold intolerance; no diabetes Neuro: Negative; no changes in balance, headaches Skin: Negative; No rashes or skin lesions Psychiatric: Negative; No behavioral problems, depression Sleep: see HPI Other comprehensive 14 point system review is negative.   PHYSICAL EXAM:   VS:  BP 130/82 (BP Location: Left Arm, Patient Position: Sitting, Cuff Size: Large)  Pulse 60   Ht _0  (1.981 m)   Wt 294 lb (133.4 kg)   BMI 33.98 kg/m     Repeat blood pressure by me 122/84  Wt Readings from Last 3 Encounters:  04/13/17 294 lb (133.4 kg)  03/31/17 291 lb (132 kg)  12/19/16 291 lb (132 kg)    General: Alert, oriented, no distress.  Skin: normal turgor, no rashes, warm and dry HEENT: Normocephalic, atraumatic. Pupils equal round and reactive to light; sclera anicteric; extraocular muscles intact; Fundi No hemorrhages or exudates Nose without nasal septal hypertrophy Mouth/Parynx benign; Mallinpatti scale 4 Neck: No JVD, no carotid bruits; normal carotid upstroke Lungs: clear to ausculatation and percussion; no wheezing or rales Chest wall: without tenderness to palpitation Heart: PMI not displaced, RRR, s1 s2 normal, 1/6 systolic murmur, no diastolic murmur, no rubs, gallops, thrills, or heaves Abdomen: soft, nontender; no hepatosplenomehaly, BS+; abdominal aorta nontender and not dilated by palpation. Back: no CVA tenderness Pulses 2+ Musculoskeletal: full range of motion, normal strength, no joint deformities Extremities: no clubbing cyanosis or edema, Homan's sign negative  Neurologic: grossly  nonfocal; Cranial nerves grossly wnl Psychologic: Normal mood and affect   Studies/Labs Reviewed:   EKG:  EKG is  ordered today.  ECG (independently read by me): Sinus rhythm with first-degree AV block with PR interval of 224 ms.  Left axis deviation.  Inferior Q waves.  Nonspecific T changes in 1 and L.  Recent Labs: BMP Latest Ref Rng & Units 12/12/2016 08/26/2016 05/21/2016  Glucose 65 - 99 mg/dL 115(H) 109(H) 110(H)  BUN 7 - 25 mg/dL _1 Creatinine 0.70 - 1.33 mg/dL 1.08 1.02 1.11  Sodium 135 - 146 mmol/L 139 140 139  Potassium 3.5 - 5.3 mmol/L 5.1 5.3 4.7  Chloride 98 - 110 mmol/L 103 103 104  CO2 20 - 31 mmol/L _2 Calcium 8.6 - 10.3 mg/dL 9.1 9.3 8.7     Hepatic Function Latest Ref Rng & Units 04/06/2017 08/26/2016 05/04/2016  Total Protein 6.0 - 8.5 g/dL 6.7 6.8 6.4(L)  Albumin 3.5 - 5.5 g/dL 4.0 4.1 3.4(L)  AST 0 - 40 IU/L _3 ALT 0 - 44 IU/L 25 21 41  Alk Phosphatase 39 - 117 IU/L 87 73 85  Total Bilirubin 0.0 - 1.2 mg/dL 0.3 0.4 0.4  Bilirubin, Direct 0.00 - 0.40 mg/dL 0.09 - -    CBC Latest Ref Rng & Units 12/19/2016 05/05/2016 05/04/2016  WBC 4.0 - 10.5 K/uL 9.5 8.7 12.2(H)  Hemoglobin 13.0 - 17.0 g/dL 13.9 13.3 15.2  Hematocrit 39.0 - 52.0 % 40.7 40.0 45.8  Platelets 150 - 400 K/uL 234 165 203   Lab Results  Component Value Date   MCV 90.6 12/19/2016   MCV 93.5 05/05/2016   MCV 94.6 05/04/2016   Lab Results  Component Value Date   TSH 1.640 04/06/2017   Lab Results  Component Value Date   HGBA1C 5.7 (H) 02/28/2014     BNP No results found for: BNP  ProBNP    Component Value Date/Time   PROBNP 80.0 10/29/2009 1944     Lipid Panel     Component Value Date/Time   CHOL 92 05/05/2016 0526   TRIG 54 05/05/2016 0526   HDL 22 (L) 05/05/2016 0526   CHOLHDL 4.2 05/05/2016 0526   VLDL 11 05/05/2016 0526   LDLCALC 59 05/05/2016 0526     RADIOLOGY: No results found.   Additional studies/ records that were  reviewed today include:    I reviewed the patient's records from Dr. Caryl Comes and Dr. Sallyanne Kuster.  I reviewed his May 2017 cardiac catheterization results and echo Doppler study.    ASSESSMENT:    1. Class 1 obesity due to excess calories with serious comorbidity and body mass index (BMI) of 33.0 to 33.9 in adult   2. OSA on CPAP   3. Ischemic cardiomyopathy   4. Essential hypertension   5. CAD S/P percutaneous coronary angioplasty   6. Ventricular tachycardia ([place])   7. ICD (implantable cardioverter-defibrillator) in place      PLAN:  Mr.Sabre Herda is a African-American gentleman who has a history of an ischemic cardiomyopathy, ventricular tachycardia, and underwent initial ICD implantation in 2006 with subsequent upgrade in January 2018.  At the time of his initial presentation in 2006 in [place] he was evaluated for obstructive sleep apnea and was found to have severe OSA.  I will try to obtain records from his initial sleep evaluation if at all possible.  He is been on CPAP therapy for 12 years duration and essentially has not slept without it.  He admits to 100% compliance.  Over this time period, he has gained weight.  There has been some slight progression of his distal RCA disease the stents have remained patent.  I interrogated his CPAP unit which did not have a memory card was not downloadable.  However, he is currently set at 12 cm water pressure.  He has been using a nasal mask.  His machine is old and starting to make some noise.  He qualifies for new machine.  With his increased weight since initiation .  I have recommended a new evaluation with an in lab split-night evaluation to make certain he is not need for BiPAP therapy rather than CPAP alone.  His echo Doppler study in May 2017 showed normalization of LV function.  He is only sleeping a proximally 6-6-1/2 hours per night.  I've suggested he increase sleep duration of 78.  If at all possible.  We discussed the effects of sleep apnea on card of vascular  health.  We discussed the importance of weight loss would be helpful both from a cardiovascular as well as sleep apnea standpoint.  His ECG today is consistent with his occluded RCA with the inferior Q waves.  He is in sinus rhythm with first-degree AV block.  We will schedule him for his new sleep study evaluation.  I will see him in the office within 2 months following that assessment and initial 30 day download.   Medication Adjustments/Labs and Tests Ordered: Current medicines are reviewed at length with the patient today.  Concerns regarding medicines are outlined above.  Medication changes, Labs and Tests ordered today are listed in the Patient Instructions below. Patient Instructions  Your physician has recommended that you have a sleep study. This test records several body functions during sleep, including: brain activity, eye movement, oxygen and carbon dioxide blood levels, heart rate and rhythm, breathing rate and rhythm, the flow of air through your mouth and nose, snoring, body muscle movements, and chest and belly movement. This will be done at [facility].  Your physician recommends that you schedule a follow-up appointment in: 3-4 months with Dr Claiborne Billings in sleep clinic.       Signed, Shelva Majestic, MD  04/20/2017 8:49 AM    [facility] [address], [place], [place], [place]  [address] Phone: [phone]

## 2017-04-15 ENCOUNTER — Other Ambulatory Visit: Payer: Self-pay | Admitting: Cardiovascular Disease

## 2017-04-15 NOTE — Telephone Encounter (Signed)
REFILL 

## 2017-04-16 ENCOUNTER — Telehealth: Payer: Self-pay

## 2017-04-16 ENCOUNTER — Telehealth: Payer: Self-pay | Admitting: Cardiovascular Disease

## 2017-04-16 NOTE — Telephone Encounter (Signed)
Patient is aware and agreeable to normal lab results

## 2017-04-16 NOTE — Telephone Encounter (Signed)
New message    Pt is calling.   Pt c/o medication issue:  1. Name of Medication: coreg  2. How are you currently taking this medication (dosage and times per day)? Once daily-pt is out  3. Are you having a reaction (difficulty breathing--STAT)? No  4. What is your medication issue? Pt is calling stating there is a copay for him to pick up his medication and he should not have a copay. He said he was told there is a generic and he is calling to find out if he can take the generic.

## 2017-04-16 NOTE — Telephone Encounter (Signed)
Called patient, he reports his pharmacy ran the rx wrong and this problem was now fixed. No further needs identified at this time.

## 2017-04-28 ENCOUNTER — Other Ambulatory Visit: Payer: Self-pay | Admitting: Cardiovascular Disease

## 2017-04-28 NOTE — Telephone Encounter (Signed)
REFILL 

## 2017-05-04 ENCOUNTER — Other Ambulatory Visit: Payer: Self-pay | Admitting: *Deleted

## 2017-05-04 MED ORDER — SILDENAFIL CITRATE 50 MG PO TABS: 50.0000 mg | ORAL_TABLET | ORAL | 0 refills | Status: DC | PRN

## 2017-05-29 ENCOUNTER — Other Ambulatory Visit: Payer: Self-pay | Admitting: Cardiovascular Disease

## 2017-06-03 ENCOUNTER — Other Ambulatory Visit: Payer: Self-pay | Admitting: Cardiovascular Disease

## 2017-06-04 ENCOUNTER — Other Ambulatory Visit: Payer: Self-pay | Admitting: Cardiovascular Disease

## 2017-06-04 ENCOUNTER — Telehealth: Payer: Self-pay | Admitting: Cardiovascular Disease

## 2017-06-04 NOTE — Telephone Encounter (Signed)
nEW MESSAGE      *STAT* If patient is at the pharmacy, call can be transferred to refill team.   1. Which medications need to be refilled? (please list name of each medication and dose if known)  vIAGRA   2. Which pharmacy/location (including street and city if local pharmacy) is medication to be sent to [place]  3. Do they need a 30 day or 90 day supply? [place]

## 2017-06-04 NOTE — Telephone Encounter (Signed)
Pt states his sleep study is authorized by his Worker's Comp.  He will bring all documentation to sleep appt.

## 2017-06-04 NOTE — Telephone Encounter (Signed)
Follow up      *STAT* If patient is at the pharmacy, call can be transferred to refill team.   1. Which medications need to be refilled? (please list name of each medication and dose if known)  Viagra  2. Which pharmacy/location (including street and city if local pharmacy) is medication to be sent to? [facility]  - on [address]   3. Do they need a 30 day or 90 day supply? [place]

## 2017-06-05 NOTE — Telephone Encounter (Signed)
Follow up     Pt states the pharmacy told him he needs a request for authorization to get it filled ?

## 2017-06-05 NOTE — Telephone Encounter (Signed)
New Message     The prescriptions was submitted with wrong doctor's name on it and the the pharmacy will not fill it.

## 2017-06-07 ENCOUNTER — Ambulatory Visit (HOSPITAL_BASED_OUTPATIENT_CLINIC_OR_DEPARTMENT_OTHER): Payer: Worker's Compensation | Attending: Cardiovascular Disease | Admitting: Cardiovascular Disease

## 2017-06-07 VITALS — Ht 78.0 in | Wt 290.0 lb

## 2017-06-07 DIAGNOSIS — G4733 Obstructive sleep apnea (adult) (pediatric): Secondary | ICD-10-CM | POA: Diagnosis present

## 2017-06-07 DIAGNOSIS — Z79899 Other long term (current) drug therapy: Secondary | ICD-10-CM | POA: Diagnosis not present

## 2017-06-07 DIAGNOSIS — I493 Ventricular premature depolarization: Secondary | ICD-10-CM | POA: Insufficient documentation

## 2017-06-07 DIAGNOSIS — Z9989 Dependence on other enabling machines and devices: Secondary | ICD-10-CM

## 2017-06-07 DIAGNOSIS — R0683 Snoring: Secondary | ICD-10-CM | POA: Insufficient documentation

## 2017-06-08 ENCOUNTER — Other Ambulatory Visit: Payer: Self-pay | Admitting: *Deleted

## 2017-06-08 MED ORDER — SILDENAFIL CITRATE 50 MG PO TABS: ORAL_TABLET | ORAL | 0 refills | Status: DC

## 2017-06-08 NOTE — Telephone Encounter (Signed)
Per [employer], all cardiac medications must be approved by one cardiac Provider. They have Dr Dani Gobble Croitoru as his primary cardiologist and to approve any other Provider their name must first be submitted and registered as Co-Provider for his cardiac care through [employer] and his Insurance.

## 2017-06-26 ENCOUNTER — Encounter: Payer: Self-pay | Admitting: Cardiovascular Disease

## 2017-06-26 ENCOUNTER — Ambulatory Visit (INDEPENDENT_AMBULATORY_CARE_PROVIDER_SITE_OTHER): Payer: Worker's Compensation | Admitting: Cardiovascular Disease

## 2017-06-26 VITALS — BP 132/87 | HR 73 | Ht 78.0 in | Wt 294.2 lb

## 2017-06-26 DIAGNOSIS — I2510 Atherosclerotic heart disease of native coronary artery without angina pectoris: Secondary | ICD-10-CM | POA: Diagnosis not present

## 2017-06-26 DIAGNOSIS — I255 Ischemic cardiomyopathy: Secondary | ICD-10-CM

## 2017-06-26 DIAGNOSIS — Z6833 Body mass index (BMI) 33.0-33.9, adult: Secondary | ICD-10-CM

## 2017-06-26 DIAGNOSIS — Z9861 Coronary angioplasty status: Secondary | ICD-10-CM | POA: Diagnosis not present

## 2017-06-26 DIAGNOSIS — G4733 Obstructive sleep apnea (adult) (pediatric): Secondary | ICD-10-CM | POA: Diagnosis not present

## 2017-06-26 DIAGNOSIS — Z9989 Dependence on other enabling machines and devices: Secondary | ICD-10-CM | POA: Diagnosis not present

## 2017-06-26 DIAGNOSIS — E6609 Other obesity due to excess calories: Secondary | ICD-10-CM | POA: Diagnosis not present

## 2017-06-26 DIAGNOSIS — Z95810 Presence of automatic (implantable) cardiac defibrillator: Secondary | ICD-10-CM | POA: Diagnosis not present

## 2017-06-26 DIAGNOSIS — I10 Essential (primary) hypertension: Secondary | ICD-10-CM

## 2017-06-26 NOTE — Patient Instructions (Addendum)
Your physician has recommended that you have a CPAP titration sleep study. This has been scheduled for AUGUST 22 8:00pm @ [facility].  Your physician recommends that you schedule a follow-up appointment in: October with Dr Claiborne Billings.

## 2017-06-26 NOTE — Progress Notes (Signed)
Patient seen in 7/13 sleep clinic.

## 2017-06-26 NOTE — Procedures (Signed)
    Patient Name: Gregory, [name] Date: 06/07/2017 Gender: Male D.O.B: 24-Dec-1960 Age (years): 60 Referring Provider: Shelva Majestic MD, ABSM Height (inches): 78 Interpreting Physician: Gregory Majestic MD, ABSM Weight (lbs): 290 RPSGT: Gregory [name] BMI: 34 MRN: [id number] Neck Size: 18.50 CLINICAL INFORMATION  Sleep Study Type: NPSG  Indication for sleep study: OSA  Epworth Sleepiness Score: 13  SLEEP STUDY TECHNIQUE As per the AASM Manual for the Scoring of Sleep and Associated Events v2.3 (April 2016) with a hypopnea requiring 4% desaturations.  The channels recorded and monitored were frontal, central and occipital EEG, electrooculogram (EOG), submentalis EMG (chin), nasal and oral airflow, thoracic and abdominal wall motion, anterior tibialis EMG, snore microphone, electrocardiogram, and pulse oximetry.  MEDICATIONS Amiodarone Amlodipine Carvedilol HCTZ KCl Sildenafil  Medications self-administered by patient taken the night of the study : N/A  SLEEP ARCHITECTURE The study was initiated at 10:29:21 PM and ended at 4:28:09 AM.  Sleep onset time was 47.7 minutes and the sleep efficiency was 22.0%. The total sleep time was 79.0 minutes.  Stage REM latency was 229.5 minutes.  The patient spent 37.97% of the night in stage N1 sleep, 58.23% in stage N2 sleep, 0.00% in stage N3 and 3.80% in REM.  Alpha intrusion was absent.  Supine sleep was 48.10%.  RESPIRATORY PARAMETERS The overall apnea/hypopnea index (AHI) was 37.2 per hour. There were 48 total apneas, including 48 obstructive, 0 central and 0 mixed apneas. There were 1 hypopneas and 11 RERAs.  The AHI during Stage REM sleep was 100.0 per hour.  AHI while supine was 58.4 per hour.  The mean oxygen saturation was 92.37%. The minimum SpO2 during sleep was 83.00%.  Soft snoring was noted during this study.  CARDIAC DATA The 2 lead EKG demonstrated pacemaker generated. The mean heart rate was 55.59  beats per minute. Other EKG findings include: PVCs.  LEG MOVEMENT DATA The total PLMS were 0 with a resulting PLMS index of 0.00. Associated arousal with leg movement index was 0.0 .  IMPRESSIONS - Severe obstructive sleep apnea occurred during this study (AHI 37.2/h; RDI 45.6/h). Events were worse with supine sleep (AHI 58.4) and very severe during REM sleep (AHI 100/h) - No significant central sleep apnea occurred during this study (CAI = 0.0/h). - Moderate oxygen desaturation to a nadir of 83.%. - Reduced sleep efficiency. - Abnormal sleep architecture with absence of slow-wave sleep and prolonged latency to grams sleep - The patient snored with Soft snoring volume. - EKG findings include PVCs. - Clinically significant periodic limb movements did not occur during sleep. No significant associated arousals.  DIAGNOSIS - Obstructive Sleep Apnea (327.23 [G47.33 ICD-10])  RECOMMENDATIONS - Therapeutic CPAP titration to determine optimal pressure required to alleviate sleep disordered breathing. - Effort should be made to optimize nasal and oral pharyngeal patency - Positional therapy avoiding supine position during sleep. - Avoid alcohol, sedatives and other CNS depressants that may worsen sleep apnea and disrupt normal sleep architecture. - Sleep hygiene should be reviewed to assess factors that may improve sleep quality. - Weight management and regular exercise should be initiated or continued if appropriate.  [Electronically signed] 06/26/2017 06:47 AM  Gregory Majestic MD,FACC, ABSM Diplomate, American Board of Sleep Medicine   NPI: [id number] NAME: [facility] PH: [phone]   FX: [phone] [place]

## 2017-06-28 NOTE — Progress Notes (Signed)
Cardiology Office Note    Date:  06/28/2017   ID:  Gregory [name], DOB [birth year], MRN [id number]  PCP:  Patient, No Pcp Per  Cardiologist:  Shelva Majestic, MD (sleep); Dr. Sallyanne Kuster  No chief complaint on file.   History of Present Illness:  Gregory [name] is a 56 y.o. male who is referred for a 2 month follow-up sleep evaluation.  He is followed by Dr. Sallyanne Kuster and Dr. Caryl Comes.  Mr. Blatz is originally from [place] and was diagnosed with obstructive sleep apnea over 12 years ago.  During hospitalization at that time, he had sustained VT and ultimately underwent ICD implantation in the context of congestive heart failure and an ischemic cardiomyopathy.  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  He had had recurrent monomorphic ventricular tachycardia and his previous documentation of inferolateral scar.  He underwent repeat catheterization in May 2017 which showed patent stents in the OM1, diagonal and mid LAD, but he had known RCA occlusion with retrograde filling of the PDA and a lateral branch of his marginal artery was subtotally occluded and there was high-grade disease in the PLA, neither of which was amenable to revascularization.In May 2017.  A follow-up echo Doppler study showed an EF at 60-65%.  He underwent ICD generator replacement in January 2018.  When he was originally diagnosed with sleep apnea in 2006 his sleep apnea was very severe.  He admits to 100% compliance and has an old RemStar Respironics CPAP machine.  He brought the machine with him to the office today and on interrogation he is set at 12 cm water pressure.  He is at [phone], hours of use.   He uses a nasal mask.  His machine is currently starting to make some noise and beginning to show signs of malfunction.  Previously, he was very symptomatic with nonrestorative sleep, frequent awakenings, loud snoring.  Currently, he  goes to bed at 1 AM, wakes up  7:30 AM.  He cannot sleep without his machine.  He is unaware of bruxism.  He denies any awareness of breakthrough snoring.  As long as he uses CPAP he does not have daytime sleepiness.  He is unaware of restless legs, hypnogognic hallucinations, no cataplexy.  There has been weight gain over the years.  At the time of his initial evaluation with me, while using his machine, he did not have significant excessive residual daytime sleepiness and shown below.   Epworth Sleepiness Scale: Situation   Chance of Dozing/Sleeping (0 = never , 1 = slight chance , 2 = moderate chance , 3 = high chance )   sitting and reading 0   watching TV 1   sitting inactive in a public place 0   being a passenger in a motor vehicle for an hour or more 0   lying down in the afternoon 3   sitting and talking to someone 0   sitting quietly after lunch (no alcohol) 0   while stopped for a few minutes in traffic as the driver 0   Total Score  4   Since I last saw him, he underwent a follow-up sleep study on 06/07/2017. This reconfirms severe sleep apnea with an AHI of 37.2, RDI of 45.6.  His  events were worse with supine sleep with an HI 58.4, and very severe during REM sleep with an AHI of 100.  Unfortunately, he had very poor sleep  efficiency at only 22%.  As result, CPAP titration was not able to be performed He is sleepy and a repeat Epworth Sleepiness Scale today endorsed at 12.  Past Medical History:  Diagnosis Date  . CAD (coronary artery disease) 12/01/2013  . Chronic combined systolic and diastolic CHF, NYHA class 1 ([place]) 12/01/2013  . Erectile dysfunction 12/01/2013  . HTN (hypertension) 12/01/2013  . Hyperlipidemia 12/01/2013  . Ischemic cardiomyopathy 12/01/2013  . Sleep apnea     Past Surgical History:  Procedure Laterality Date  . ACHILLES TENDON REPAIR     lft foot  . CARDIAC CATHETERIZATION N/A 05/05/2016   Procedure: Left Heart Cath and Coronary Angiography;   Surgeon: Leonie Man, MD;  Location: [facility];  Service: Cardiovascular;  Laterality: N/A;  . CARDIAC DEFIBRILLATOR PLACEMENT    . EP IMPLANTABLE DEVICE N/A 12/19/2016   Procedure: ICD Generator Changeout;  Surgeon: [name], MD;  Location: [facility];  Service: Cardiovascular;  Laterality: N/A;  . WRIST TENODESIS      Current Medications: Outpatient Medications Prior to Visit  Medication Sig Dispense Refill  . amiodarone (PACERONE) 200 MG tablet Take 1 tablet (200 mg total) by mouth 2 (two) times daily. 180 tablet 3  . amLODipine (NORVASC) 10 MG tablet Take 1 tablet (10 mg total) by mouth daily. 30 tablet 11  . aspirin 81 MG tablet Take 81 mg by mouth daily.    Marland Kitchen atorvastatin (LIPITOR) 80 MG tablet Take 80 mg by mouth daily. In the evening    . carvedilol (COREG CR) 80 MG 24 hr capsule Take 1 capsule (80 mg total) by mouth daily. 30 capsule 11  . clopidogrel (PLAVIX) 75 MG tablet TAKE 1 TABLET BY MOUTH EVERY DAY WITH BREAKFAST 90 tablet 0  . hydrochlorothiazide (HYDRODIURIL) 25 MG tablet TAKE 1 TABLET BY MOUTH EVERY DAY 90 tablet 2  . ibuprofen (ADVIL,MOTRIN) 400 MG tablet Take 1 tablet (400 mg total) by mouth every 6 (six) hours as needed for moderate pain. 30 tablet 0  . potassium chloride (K-DUR,KLOR-CON) 10 MEQ tablet TAKE 1 TABLET BY MOUTH EVERY DAY 90 tablet 3  . sacubitril-valsartan (ENTRESTO) 97-103 MG Take 1 tablet by mouth 2 (two) times daily. 60 tablet 8  . sildenafil (VIAGRA) 50 MG tablet TAKE 1 TABLET BY MOUTH EVERY DAY AS NEEDED FOR ERECTILE DYSFUNCTION 30 tablet 0   No facility-administered medications prior to visit.      Allergies:   Patient has no known allergies.   Social History   Social History  . Marital status: Single    Spouse name: N/A  . Number of children: N/A  . Years of education: N/A   Social History Main Topics  . Smoking status: Former Smoker    Packs/day: 0.50    Years: 30.00    Types: Cigarettes    Quit date:  05/31/2013  . Smokeless tobacco: Never Used  . Alcohol use No     Comment: ocassionally  . Drug use: Unknown  . Sexual activity: Not Asked   Other Topics Concern  . None   Social History Narrative  . None    Additional social history is notable in that he has 3 children and 2 grandchildren.  Family History:  The patient's family history includes Brain cancer in his father; Heart disease in his mother; Hypertension in his daughter.   ROS General: Negative; No fevers, chills, or night sweats;  HEENT: Negative; No changes in vision or hearing, sinus  congestion, difficulty swallowing Pulmonary: Negative; No cough, wheezing, shortness of breath, hemoptysis Cardiovascular: ischemic cardiomyopathy; h/o VT; s/p ICD GI: Negative; No nausea, vomiting, diarrhea, or abdominal pain GU: Negative; No dysuria, hematuria, or difficulty voiding Musculoskeletal: Negative; no myalgias, joint pain, or weakness Hematologic/Oncology: Negative; no easy bruising, bleeding Endocrine: Negative; no heat/cold intolerance; no diabetes Neuro: Negative; no changes in balance, headaches Skin: Negative; No rashes or skin lesions Psychiatric: Negative; No behavioral problems, depression Sleep: see HPI Other comprehensive 14 point system review is negative.   PHYSICAL EXAM:   VS:  BP 132/87 (BP Location: Right Arm)   Pulse 73   Ht '6\' 6"'  (1.981 m)   Wt 294 lb 3.2 oz (133.4 kg)   BMI 34.00 kg/m     Repeat blood pressure by me 124/70  Wt Readings from Last 3 Encounters:  06/26/17 294 lb 3.2 oz (133.4 kg)  06/07/17 290 lb (131.5 kg)  04/13/17 294 lb (133.4 kg)    General: Alert, oriented, no distress.  Skin: normal turgor, no rashes, warm and dry HEENT: Normocephalic, atraumatic. Pupils equal round and reactive to light; sclera anicteric; extraocular muscles intact;  Nose without nasal septal hypertrophy Mouth/Parynx benign; Mallinpatti scale 4 Neck: No JVD, no carotid bruits; normal carotid  upstroke Lungs: clear to ausculatation and percussion; no wheezing or rales Chest wall: without tenderness to palpitation Heart: PMI not displaced, RRR, s1 s2 normal, 1/6 systolic murmur, no diastolic murmur, no rubs, gallops, thrills, or heaves Abdomen: soft, nontender; no hepatosplenomehaly, BS+; abdominal aorta nontender and not dilated by palpation. Back: no CVA tenderness Pulses 2+ Musculoskeletal: full range of motion, normal strength, no joint deformities Extremities: no clubbing cyanosis or edema, Homan's sign negative  Neurologic: grossly nonfocal; Cranial nerves grossly wnl Psychologic: Normal mood and affect; normal cognition    Studies/Labs Reviewed:   EKG:  EKG is  Not ordered today.    May 2018 ECG (independently read by me): Sinus rhythm with first-degree AV block with PR interval of 224 ms.  Left axis deviation.  Inferior Q waves.  Nonspecific T changes in 1 and L.  Recent Labs: BMP Latest Ref Rng & Units 12/12/2016 08/26/2016 05/21/2016  Glucose 65 - 99 mg/dL 115(H) 109(H) 110(H)  BUN 7 - 25 mg/dL '16 12 13  ' Creatinine 0.70 - 1.33 mg/dL 1.08 1.02 1.11  Sodium 135 - 146 mmol/L 139 140 139  Potassium 3.5 - 5.3 mmol/L 5.1 5.3 4.7  Chloride 98 - 110 mmol/L 103 103 104  CO2 20 - 31 mmol/L '28 31 27  ' Calcium 8.6 - 10.3 mg/dL 9.1 9.3 8.7     Hepatic Function Latest Ref Rng & Units 04/06/2017 08/26/2016 05/04/2016  Total Protein 6.0 - 8.5 g/dL 6.7 6.8 6.4(L)  Albumin 3.5 - 5.5 g/dL 4.0 4.1 3.4(L)  AST 0 - 40 IU/L '19 16 21  ' ALT 0 - 44 IU/L 25 21 41  Alk Phosphatase 39 - 117 IU/L 87 73 85  Total Bilirubin 0.0 - 1.2 mg/dL 0.3 0.4 0.4  Bilirubin, Direct 0.00 - 0.40 mg/dL 0.09 - -    CBC Latest Ref Rng & Units 12/19/2016 05/05/2016 05/04/2016  WBC 4.0 - 10.5 K/uL 9.5 8.7 12.2(H)  Hemoglobin 13.0 - 17.0 g/dL 13.9 13.3 15.2  Hematocrit 39.0 - 52.0 % 40.7 40.0 45.8  Platelets 150 - 400 K/uL 234 165 203   Lab Results  Component Value Date   MCV 90.6 12/19/2016   MCV 93.5  05/05/2016   MCV 94.6 05/04/2016   Lab  Results  Component Value Date   TSH 1.640 04/06/2017   Lab Results  Component Value Date   HGBA1C 5.7 (H) 02/28/2014     BNP No results found for: BNP  ProBNP    Component Value Date/Time   PROBNP 80.0 10/29/2009 1944     Lipid Panel     Component Value Date/Time   CHOL 92 05/05/2016 0526   TRIG 54 05/05/2016 0526   HDL 22 (L) 05/05/2016 0526   CHOLHDL 4.2 05/05/2016 0526   VLDL 11 05/05/2016 0526   LDLCALC 59 05/05/2016 0526     RADIOLOGY: No results found.   Additional studies/ records that were reviewed today include:  I reviewed the patient's records from Dr. Caryl Comes and Dr. Sallyanne Kuster.  I reviewed his May 2017 cardiac catheterization results and echo Doppler study. I reviewed his most recent sleep study in detail.   ASSESSMENT:    1. OSA on CPAP   2. Class 1 obesity due to excess calories with serious comorbidity and body mass index (BMI) of 33.0 to 33.9 in adult   3. Essential hypertension   4. ICD (implantable cardioverter-defibrillator) in place   5. CAD S/P percutaneous coronary angioplasty   6. Ischemic cardiomyopathy      PLAN:  Mr.Emanuell Anglemyer is a African-American gentleman who has a history of an ischemic cardiomyopathy, ventricular tachycardia, and underwent initial ICD implantation in 2006 with subsequent upgrade in January 2018.  At the time of his initial presentation in 2006 in [place] he was evaluated for obstructive sleep apnea and was found to have severe OSA.  I will try to obtain records from his initial sleep evaluation if at all possible.  He is been on CPAP therapy for 12 years duration and essentially has not slept without it.  He admits to 100% compliance.  Over this time period, he has gained weight.  There has been some slight progression of his distal RCA disease the stents have remained patent.  When I initially saw him I interrogated his CPAP unit which did not have a memory card was not  downloadable.  However, he is currently set at 12 cm water pressure.  He has been using a nasal mask.  His machine is old and starting to make some noise.  He qualifies for new machine.  With his weight gain and 12 years since his initial evaluation, he underwent a new sleep study for reassessment of his sleep apnea.  Unfortunately, he had very poor sleep efficiency of only 22% and as result, this could not be a split-night protocol.  However, as noted above.  He has severe sleep apnea.  He will be undergoing a CPAP with possible BiPAP titration, if necessary, study for further assessment.  Once this is complete .  He will obtain a new machine and I will see him in sleep clinic for assessment of compliance and follow-up evaluation.  He continues to be sleeping.  I suspect this is due to reduced sleep efficiency, as well as inadequate sleep duration.  I again stressed the importance of sleeping for 7-8 hours per night.  I will see him in several months for follow-up evaluation.  Medication Adjustments/Labs and Tests Ordered: Current medicines are reviewed at length with the patient today.  Concerns regarding medicines are outlined above.  Medication changes, Labs and Tests ordered today are listed in the Patient Instructions below. Patient Instructions  Your physician has recommended that you have a CPAP titration sleep study. This has been scheduled  for AUGUST 22 8:00pm @ [facility].  Your physician recommends that you schedule a follow-up appointment in: October with Dr Claiborne Billings.       Signed, Shelva Majestic, MD  06/28/2017 5:46 PM    [facility] [address] [place], [place], [place]  [address] Phone: [phone]

## 2017-06-30 ENCOUNTER — Ambulatory Visit (INDEPENDENT_AMBULATORY_CARE_PROVIDER_SITE_OTHER): Payer: Worker's Compensation | Admitting: *Deleted

## 2017-06-30 DIAGNOSIS — I255 Ischemic cardiomyopathy: Secondary | ICD-10-CM

## 2017-06-30 NOTE — Progress Notes (Signed)
Remote ICD transmission.   

## 2017-07-01 ENCOUNTER — Encounter: Payer: Self-pay | Admitting: Cardiology

## 2017-07-02 LAB — CUP PACEART REMOTE DEVICE CHECK
Battery Remaining Longevity: 123 mo
Battery Voltage: 3.03 V
Brady Statistic AP VS Percent: 40.4 %
Brady Statistic AS VS Percent: 59.08 %
Date Time Interrogation Session: 20180717073522
HighPow Impedance: 45 Ohm
HighPow Impedance: 54 Ohm
Implantable Lead Implant Date: 20051120
Implantable Lead Location: 753859
Implantable Lead Location: 753860
Implantable Pulse Generator Implant Date: 20180105
Lead Channel Impedance Value: 418 Ohm
Lead Channel Impedance Value: 418 Ohm
Lead Channel Pacing Threshold Amplitude: 0.625 V
Lead Channel Pacing Threshold Pulse Width: 0.4 ms
Lead Channel Pacing Threshold Pulse Width: 0.4 ms
Lead Channel Sensing Intrinsic Amplitude: 4 mV
Lead Channel Sensing Intrinsic Amplitude: 4 mV
MDC IDC LEAD IMPLANT DT: 20101117
MDC IDC MSMT LEADCHNL RV IMPEDANCE VALUE: 304 Ohm
MDC IDC MSMT LEADCHNL RV PACING THRESHOLD AMPLITUDE: 1 V
MDC IDC MSMT LEADCHNL RV SENSING INTR AMPL: 14.75 mV
MDC IDC MSMT LEADCHNL RV SENSING INTR AMPL: 14.75 mV
MDC IDC SET LEADCHNL RA PACING AMPLITUDE: 1.5 V
MDC IDC SET LEADCHNL RV PACING AMPLITUDE: 2 V
MDC IDC SET LEADCHNL RV PACING PULSEWIDTH: 0.4 ms
MDC IDC SET LEADCHNL RV SENSING SENSITIVITY: 0.6 mV
MDC IDC STAT BRADY AP VP PERCENT: 0.44 %
MDC IDC STAT BRADY AS VP PERCENT: 0.08 %
MDC IDC STAT BRADY RA PERCENT PACED: 40.7 %
MDC IDC STAT BRADY RV PERCENT PACED: 0.55 %

## 2017-07-15 ENCOUNTER — Telehealth: Payer: Self-pay | Admitting: Internal Medicine

## 2017-07-15 DIAGNOSIS — I5022 Chronic systolic (congestive) heart failure: Secondary | ICD-10-CM

## 2017-07-15 DIAGNOSIS — I255 Ischemic cardiomyopathy: Secondary | ICD-10-CM

## 2017-07-15 DIAGNOSIS — I472 Ventricular tachycardia, unspecified: Secondary | ICD-10-CM

## 2017-07-15 DIAGNOSIS — I493 Ventricular premature depolarization: Secondary | ICD-10-CM

## 2017-07-15 MED ORDER — CLOPIDOGREL BISULFATE 75 MG PO TABS: ORAL_TABLET | ORAL | 0 refills | Status: DC

## 2017-07-15 MED ORDER — AMIODARONE HCL 200 MG PO TABS: 200.0000 mg | ORAL_TABLET | Freq: Two times a day (BID) | ORAL | 0 refills | Status: DC

## 2017-07-15 MED ORDER — ATORVASTATIN CALCIUM 80 MG PO TABS: 80.0000 mg | ORAL_TABLET | Freq: Every day | ORAL | 0 refills | Status: DC

## 2017-07-15 MED ORDER — HYDROCHLOROTHIAZIDE 25 MG PO TABS: 25.0000 mg | ORAL_TABLET | Freq: Every day | ORAL | 0 refills | Status: DC

## 2017-07-15 NOTE — Telephone Encounter (Signed)
This is Dr. Croitoru's pt 

## 2017-07-15 NOTE — Telephone Encounter (Signed)
New message    Pt is calling about his medication. He said he is almost out of medication and is going out of town.   *STAT* If patient is at the pharmacy, call can be transferred to refill team.   1. Which medications need to be refilled? (please list name of each medication and dose if known) atorvastatin, clopidogrel, amiodarone, hydrochlorothiazide  2. Which pharmacy/location (including street and city if local pharmacy) is medication to be sent to? [facility] on [address]   3. Do they need a 30 day or 90 day supply? 30day

## 2017-07-15 NOTE — Telephone Encounter (Signed)
Rxs sent

## 2017-08-05 ENCOUNTER — Ambulatory Visit (HOSPITAL_BASED_OUTPATIENT_CLINIC_OR_DEPARTMENT_OTHER): Payer: Worker's Compensation | Attending: Cardiovascular Disease | Admitting: Cardiovascular Disease

## 2017-08-05 VITALS — Ht 78.0 in | Wt 292.0 lb

## 2017-08-05 DIAGNOSIS — Z9989 Dependence on other enabling machines and devices: Secondary | ICD-10-CM | POA: Diagnosis not present

## 2017-08-05 DIAGNOSIS — G4733 Obstructive sleep apnea (adult) (pediatric): Secondary | ICD-10-CM | POA: Insufficient documentation

## 2017-08-21 ENCOUNTER — Telehealth: Payer: Self-pay | Admitting: *Deleted

## 2017-08-21 NOTE — Progress Notes (Signed)
CPAP orders faxed to [facility]/7, patient aware.

## 2017-08-21 NOTE — Procedures (Signed)
Patient Name: Gregory [name], Gregory [name] Date: 08/05/2017 Gender: Male D.O.B: [birth year] Age (years): 61 Referring Provider: Shelva Majestic MD, ABSM Height (inches): 78 Interpreting Physician: Shelva Majestic MD, ABSM Weight (lbs): 292 RPSGT: Laren Everts BMI: 34 MRN: [id number] Neck Size: 18.50  CLINICAL INFORMATION The patient is referred for a CPAP titration to treat sleep apnea.  Date of NPSG:  06/07/2017: AHI 37.2/h' RDI 45.6/h; AHI REM 100/h; O2 desaturation to 83%  SLEEP STUDY TECHNIQUE As per the AASM Manual for the Scoring of Sleep and Associated Events v2.3 (April 2016) with a hypopnea requiring 4% desaturations.  The channels recorded and monitored were frontal, central and occipital EEG, electrooculogram (EOG), submentalis EMG (chin), nasal and oral airflow, thoracic and abdominal wall motion, anterior tibialis EMG, snore microphone, electrocardiogram, and pulse oximetry. Continuous positive airway pressure (CPAP) was initiated at the beginning of the study and titrated to treat sleep-disordered breathing.  MEDICATIONS *    amiodarone (PACERONE) 200 MG tablet    Take 1 tablet (200 mg total) by mouth 2 (two) times daily.    *    amLODipine (NORVASC) 10 MG tablet    Take 1 tablet (10 mg total) by mouth daily.     *    aspirin 81 MG tablet    Take 81 mg by mouth daily.           *    atorvastatin (LIPITOR) 80 MG tablet    Take 80 mg by mouth daily. In the evening           *    carvedilol (COREG CR) 80 MG 24 hr capsule    Take 1 capsule (80 mg total) by mouth daily.     *    clopidogrel (PLAVIX) 75 MG tablet    TAKE 1 TABLET BY MOUTH EVERY DAY WITH BREAKFAST     *    ibuprofen (ADVIL,MOTRIN) 400 MG tablet    Take 1 tablet (400 mg total) by mouth every 6 (six) hours as needed for moderate pain.    *    potassium chloride (K-DUR,KLOR-CON) 10 MEQ tablet    TAKE 1 TABLET BY MOUTH EVERY DAY     *    sacubitril-valsartan (ENTRESTO) 97-103 MG    Take 1 tablet by mouth 2  (two) times daily.     *    sildenafil (VIAGRA) 50 MG tablet    TAKE 1 TABLET BY MOUTH AS NEEDED FOR ERECTILE DYSFUNCTION   *    hydrochlorothiazide (HYDRODIURIL) 25 MG tablet    TAKE 1 TABLET BY MOUTH EVERY DAY          Medications self-administered by patient taken the night of the study : N/A  TECHNICIAN COMMENTS Comments added by technician: NONE   RESPIRATORY PARAMETERS Optimal PAP Pressure (cm): 16 AHI at Optimal Pressure (/hr): 1.9 Overall Minimal O2 (%): 82.00 Supine % at Optimal Pressure (%): 54 Minimal O2 at Optimal Pressure (%): 92.0    SLEEP ARCHITECTURE The study was initiated at 11:35:20 PM and ended at 5:31:36 AM.  Sleep onset time was 14.9 minutes and the sleep efficiency was 77.9%. The total sleep time was 277.5 minutes.  The patient spent 16.22% of the night in stage N1 sleep, 56.40% in stage N2 sleep, 0.00% in stage N3 and 27.39% in REM.Stage REM latency was 100.5 minutes  Wake after sleep onset was 63.9. Alpha intrusion was absent. Supine sleep was 46.62%.  CARDIAC DATA The 2 lead EKG demonstrated sinus rhythm.  The mean heart rate was 54.76 beats per minute. Other EKG findings include: PVCs.  LEG MOVEMENT DATA The total Periodic Limb Movements of Sleep (PLMS) were 0. The PLMS index was 0.00. A PLMS index of <15 is considered normal in adults.  IMPRESSIONS - The  patient was titrated to an optimal PAP pressure at 16 cm of water. AHI at 16 cwp 1.9/h.  Oxygen nadir 92% - Central sleep apnea was not noted during this titration (CAI = 0.4/h). - Moderate oxygen desaturations to a nadir of 82% at 9 cwp. - The patient snored with Soft snoring volume during this titration study. - 2-lead EKG demonstrated: PVCs - Clinically significant periodic limb movements were not noted during this study. Arousals associated with PLMs were rare.  DIAGNOSIS - Obstructive Sleep Apnea (327.23 [G47.33 ICD-10])  RECOMMENDATIONS - Recommend an initial trial of CPAP therapy EPR of 3  at16 cm H2O with heated humidification. A Medium size [employer]Paykel Full Face Mask Simplus mask was used for the titration trial. - Efforts should be used to optimize nasal and oral pharyngeal patency. - Avoid alcohol, sedatives and other CNS depressants that may worsen sleep apnea and disrupt normal sleep architecture. - Sleep hygiene should be reviewed to assess factors that may improve sleep quality. - Weight management and regular exercise should be initiated or continued. - Recommend a download be obtained in 30 days with sleep clinic follow-up evaluation after 4 weeks of therapy. -  [Electronically signed] 08/21/2017 07:19 AM  Shelva Majestic MD, [facility], [place], American Board of Sleep Medicine   NPI: [id number] [place] PH: [phone]   FX: [phone] [place]

## 2017-08-21 NOTE — Telephone Encounter (Signed)
CPAP orders faxed to [facility]-patient aware and verbalized understanding.  Advised to call back if he has not been contacted by company within 2 weeks.

## 2017-08-21 NOTE — Telephone Encounter (Signed)
-----   Message from Troy Sine, MD sent at 08/21/2017  7:25 AM EDT ----- Angie Fava' .  Please set up with DME company for CPAP therapy and arrange a download in one month with follow-up sleep clinic evaluation.

## 2017-08-31 ENCOUNTER — Other Ambulatory Visit: Payer: Self-pay | Admitting: Cardiovascular Disease

## 2017-08-31 DIAGNOSIS — I493 Ventricular premature depolarization: Secondary | ICD-10-CM

## 2017-08-31 DIAGNOSIS — I255 Ischemic cardiomyopathy: Secondary | ICD-10-CM

## 2017-08-31 DIAGNOSIS — I472 Ventricular tachycardia, unspecified: Secondary | ICD-10-CM

## 2017-08-31 DIAGNOSIS — I5022 Chronic systolic (congestive) heart failure: Secondary | ICD-10-CM

## 2017-08-31 NOTE — Telephone Encounter (Signed)
New message    Pt is calling stating his insurance is disputing some of his medication and he is going to have to pay for it. So he needs it sent in to another pharmacy.    *STAT* If patient is at the pharmacy, call can be transferred to refill team.   1. Which medications need to be refilled? (please list name of each medication and dose if known) amiodarone 200 mg, sildenafil 50 mg, atorvastatin 80 mg  2. Which pharmacy/location (including street and city if local pharmacy) is medication to be sent to? [facility] on [address]  3. Do they need a 30 day or 90 day supply? 30 day

## 2017-08-31 NOTE — Telephone Encounter (Signed)
Follow up      Pt needs these called in today , he will be out tomorrow

## 2017-09-01 MED ORDER — SILDENAFIL CITRATE 50 MG PO TABS: ORAL_TABLET | ORAL | 0 refills | Status: DC

## 2017-09-01 MED ORDER — AMIODARONE HCL 200 MG PO TABS
200.0000 mg | ORAL_TABLET | Freq: Two times a day (BID) | ORAL | 5 refills | Status: DC
Start: 2017-09-01 — End: 2017-10-15

## 2017-09-01 MED ORDER — ATORVASTATIN CALCIUM 80 MG PO TABS: 80.0000 mg | ORAL_TABLET | Freq: Every day | ORAL | 5 refills | Status: DC

## 2017-09-01 NOTE — Telephone Encounter (Signed)
Rx(s) sent to pharmacy electronically.  

## 2017-09-01 NOTE — Telephone Encounter (Signed)
Follow up     Pt is out of medication and he needs to have this called into [facility] as soon as possible

## 2017-09-01 NOTE — Telephone Encounter (Signed)
Follow up   Pt calling again - states no one has called him back - needs medications

## 2017-09-07 ENCOUNTER — Telehealth: Payer: Self-pay | Admitting: Cardiovascular Disease

## 2017-09-07 NOTE — Telephone Encounter (Signed)
New message    Pt is calling asking about his sleep machine. He said another company was supposed to contact him and he has not heard from anyone. Please call.

## 2017-09-08 NOTE — Telephone Encounter (Signed)
Contacted [employer]-awaiting demographics sheet.  Demo sheet faxed to [employer].  Anderson Malta aware

## 2017-09-10 NOTE — Telephone Encounter (Signed)
Called patient-was contact by [employer] today.

## 2017-09-29 ENCOUNTER — Telehealth: Payer: Self-pay | Admitting: Cardiology

## 2017-09-29 ENCOUNTER — Telehealth: Payer: Self-pay | Admitting: Cardiovascular Disease

## 2017-09-29 ENCOUNTER — Ambulatory Visit (INDEPENDENT_AMBULATORY_CARE_PROVIDER_SITE_OTHER): Payer: Worker's Compensation | Admitting: *Deleted

## 2017-09-29 DIAGNOSIS — I255 Ischemic cardiomyopathy: Secondary | ICD-10-CM

## 2017-09-29 NOTE — Telephone Encounter (Signed)
New message    Patient wanting refill Patient states he is having trouble getting medication filled with [facility], states may need authorization.   1. Which medications need to be refilled? (please list name of each medication and dose if known) sildenafil (VIAGRA) 50 MG tablet  2. Which pharmacy/location (including street and city if local pharmacy) is medication to be sent to? [place]  3. Do they need a 30 day or 90 day supply? [place]

## 2017-09-29 NOTE — Telephone Encounter (Signed)
Spoke with pt and reminded pt of remote transmission that is due today. Pt verbalized understanding.   

## 2017-09-30 NOTE — Telephone Encounter (Signed)
Pt says he went to the pharmacy and medicine still had not been called in.

## 2017-09-30 NOTE — Telephone Encounter (Signed)
Will need office appt for refills. MCr

## 2017-09-30 NOTE — Telephone Encounter (Signed)
Routed to MD/CMA to advise on refill request. Has not seen Dr. Sallyanne Kuster in office in >1 year

## 2017-10-01 LAB — CUP PACEART REMOTE DEVICE CHECK
Brady Statistic AP VP Percent: 0.42 %
Brady Statistic AS VP Percent: 0.05 %
Brady Statistic AS VS Percent: 54.8 %
Brady Statistic RA Percent Paced: 45.16 %
Date Time Interrogation Session: 20181016221603
HIGH POWER IMPEDANCE MEASURED VALUE: 45 Ohm
HIGH POWER IMPEDANCE MEASURED VALUE: 58 Ohm
Implantable Lead Implant Date: 20051120
Implantable Lead Implant Date: 20101117
Implantable Lead Location: 753860
Lead Channel Impedance Value: 418 Ohm
Lead Channel Pacing Threshold Pulse Width: 0.4 ms
Lead Channel Pacing Threshold Pulse Width: 0.4 ms
Lead Channel Sensing Intrinsic Amplitude: 16 mV
Lead Channel Sensing Intrinsic Amplitude: 16 mV
Lead Channel Sensing Intrinsic Amplitude: 4.25 mV
Lead Channel Setting Pacing Amplitude: 1.5 V
Lead Channel Setting Pacing Pulse Width: 0.4 ms
MDC IDC LEAD LOCATION: 753859
MDC IDC MSMT BATTERY REMAINING LONGEVITY: 121 mo
MDC IDC MSMT BATTERY VOLTAGE: 3.01 V
MDC IDC MSMT LEADCHNL RA PACING THRESHOLD AMPLITUDE: 0.625 V
MDC IDC MSMT LEADCHNL RA SENSING INTR AMPL: 4.25 mV
MDC IDC MSMT LEADCHNL RV IMPEDANCE VALUE: 342 Ohm
MDC IDC MSMT LEADCHNL RV IMPEDANCE VALUE: 456 Ohm
MDC IDC MSMT LEADCHNL RV PACING THRESHOLD AMPLITUDE: 1.125 V
MDC IDC PG IMPLANT DT: 20180105
MDC IDC SET LEADCHNL RV PACING AMPLITUDE: 2.25 V
MDC IDC SET LEADCHNL RV SENSING SENSITIVITY: 0.6 mV
MDC IDC STAT BRADY AP VS PERCENT: 44.72 %
MDC IDC STAT BRADY RV PERCENT PACED: 0.5 %

## 2017-10-01 NOTE — Progress Notes (Signed)
Remote ICD transmission.   

## 2017-10-01 NOTE — Telephone Encounter (Signed)
Patient called and was notified he will need to be seen in office for refills. Explained that he was last seen 08/2016. Dr. Loletha Grayer has no appts until Jan 2019. Scheduled first available non-acute slot w/Luke, PA (on MD care team) for 10/15/17 @ 0930. Patient is aware that PRN med will not be refilled until he is seen in the office.

## 2017-10-02 ENCOUNTER — Encounter: Payer: Self-pay | Admitting: Cardiology

## 2017-10-12 ENCOUNTER — Ambulatory Visit: Payer: Worker's Compensation | Admitting: Cardiovascular Disease

## 2017-10-14 ENCOUNTER — Other Ambulatory Visit: Payer: Self-pay | Admitting: Cardiovascular Disease

## 2017-10-14 NOTE — Telephone Encounter (Signed)
REFILL 

## 2017-10-15 ENCOUNTER — Encounter: Payer: Self-pay | Admitting: Cardiology

## 2017-10-15 ENCOUNTER — Ambulatory Visit (INDEPENDENT_AMBULATORY_CARE_PROVIDER_SITE_OTHER): Payer: Worker's Compensation | Admitting: Cardiology

## 2017-10-15 VITALS — BP 130/86 | HR 84 | Ht 78.0 in | Wt 297.4 lb

## 2017-10-15 DIAGNOSIS — I5022 Chronic systolic (congestive) heart failure: Secondary | ICD-10-CM

## 2017-10-15 DIAGNOSIS — Z95810 Presence of automatic (implantable) cardiac defibrillator: Secondary | ICD-10-CM

## 2017-10-15 DIAGNOSIS — R5383 Other fatigue: Secondary | ICD-10-CM | POA: Diagnosis not present

## 2017-10-15 DIAGNOSIS — E785 Hyperlipidemia, unspecified: Secondary | ICD-10-CM

## 2017-10-15 DIAGNOSIS — I493 Ventricular premature depolarization: Secondary | ICD-10-CM

## 2017-10-15 DIAGNOSIS — I2510 Atherosclerotic heart disease of native coronary artery without angina pectoris: Secondary | ICD-10-CM

## 2017-10-15 DIAGNOSIS — I472 Ventricular tachycardia, unspecified: Secondary | ICD-10-CM

## 2017-10-15 DIAGNOSIS — Z9861 Coronary angioplasty status: Secondary | ICD-10-CM

## 2017-10-15 DIAGNOSIS — N529 Male erectile dysfunction, unspecified: Secondary | ICD-10-CM

## 2017-10-15 DIAGNOSIS — I10 Essential (primary) hypertension: Secondary | ICD-10-CM

## 2017-10-15 DIAGNOSIS — Z79899 Other long term (current) drug therapy: Secondary | ICD-10-CM

## 2017-10-15 DIAGNOSIS — Z9989 Dependence on other enabling machines and devices: Secondary | ICD-10-CM

## 2017-10-15 DIAGNOSIS — G4733 Obstructive sleep apnea (adult) (pediatric): Secondary | ICD-10-CM

## 2017-10-15 DIAGNOSIS — I255 Ischemic cardiomyopathy: Secondary | ICD-10-CM

## 2017-10-15 LAB — TSH: TSH: 2.02 u[IU]/mL (ref 0.450–4.500)

## 2017-10-15 LAB — COMPREHENSIVE METABOLIC PANEL
ALT: 27 IU/L (ref 0–44)
AST: 17 IU/L (ref 0–40)
Albumin/Globulin Ratio: 1.5 (ref 1.2–2.2)
Albumin: 4 g/dL (ref 3.5–5.5)
Alkaline Phosphatase: 112 IU/L (ref 39–117)
BUN/Creatinine Ratio: 14 (ref 9–20)
BUN: 16 mg/dL (ref 6–24)
Bilirubin Total: 0.3 mg/dL (ref 0.0–1.2)
CO2: 25 mmol/L (ref 20–29)
Calcium: 8.7 mg/dL (ref 8.7–10.2)
Chloride: 101 mmol/L (ref 96–106)
Creatinine, Ser: 1.12 mg/dL (ref 0.76–1.27)
GFR calc Af Amer: 83 mL/min/{1.73_m2} (ref >59)
GFR calc non Af Amer: 72 mL/min/{1.73_m2} (ref >59)
Globulin, Total: 2.6 g/dL (ref 1.5–4.5)
Glucose: 114 mg/dL — ABNORMAL HIGH (ref 65–99)
Potassium: 4.8 mmol/L (ref 3.5–5.2)
Sodium: 139 mmol/L (ref 134–144)
Total Protein: 6.6 g/dL (ref 6.0–8.5)

## 2017-10-15 LAB — CBC
Hematocrit: 28.6 % — ABNORMAL LOW (ref 37.5–51.0)
Hemoglobin: 8.9 g/dL — ABNORMAL LOW (ref 13.0–17.7)
MCH: 23.1 pg — ABNORMAL LOW (ref 26.6–33.0)
MCHC: 31.1 g/dL — ABNORMAL LOW (ref 31.5–35.7)
MCV: 74 fL — ABNORMAL LOW (ref 79–97)
Platelets: 333 10*3/uL (ref 150–379)
RBC: 3.85 x10E6/uL — ABNORMAL LOW (ref 4.14–5.80)
RDW: 16.3 % — ABNORMAL HIGH (ref 12.3–15.4)
WBC: 7.1 10*3/uL (ref 3.4–10.8)

## 2017-10-15 LAB — LIPID PANEL
Chol/HDL Ratio: 3.9 ratio (ref 0.0–5.0)
Cholesterol, Total: 132 mg/dL (ref 100–199)
HDL: 34 mg/dL — ABNORMAL LOW (ref >39)
LDL Calculated: 87 mg/dL (ref 0–99)
Triglycerides: 57 mg/dL (ref 0–149)
VLDL Cholesterol Cal: 11 mg/dL (ref 5–40)

## 2017-10-15 MED ORDER — AMIODARONE HCL 200 MG PO TABS: 300.0000 mg | ORAL_TABLET | Freq: Every day | ORAL | 6 refills | Status: DC

## 2017-10-15 MED ORDER — SILDENAFIL CITRATE 50 MG PO TABS: ORAL_TABLET | ORAL | 1 refills | Status: DC

## 2017-10-15 NOTE — Assessment & Plan Note (Signed)
Compliant 

## 2017-10-15 NOTE — Assessment & Plan Note (Signed)
Last admitted though ED May 2017 with VT- shocked in ED and Amiodarone added

## 2017-10-15 NOTE — Assessment & Plan Note (Signed)
Chronic total occlusion of the right coronary artery, drug-eluting Taxus stents to the diagonal and circumflex in 2005 in [place]. Cath 05/06/16- no culprit lesion identified

## 2017-10-15 NOTE — Assessment & Plan Note (Signed)
Controlled.  

## 2017-10-15 NOTE — Assessment & Plan Note (Signed)
MDT- device changed Jan 2018

## 2017-10-15 NOTE — Patient Instructions (Signed)
Medication Instructions:  INCREASE- Amiodarone 300 mg daily  If you need a refill on your cardiac medications before your next appointment, please call your pharmacy.  Labwork: CBC, CMP, TSH, and fasting Lipids HERE IN OUR OFFICE AT [facility]  Testing/Procedures: None Ordered   Follow-Up: Your physician wants you to follow-up in: 6 Months with Dr Sallyanne Kuster. You should receive a reminder letter in the mail two months in advance. If you do not receive a letter, please call our office [phone].    Thank you for choosing [facility] at [facility]!!

## 2017-10-15 NOTE — Assessment & Plan Note (Signed)
Pt asked for a refill on sildenifil

## 2017-10-15 NOTE — Assessment & Plan Note (Signed)
Last EF 55-60% May 2017

## 2017-10-15 NOTE — Progress Notes (Signed)
10/15/2017 Gregory [name]   [birth year]  [id number]  Primary Physician Patient, No Pcp Per Primary Cardiologist: Dr Sallyanne Kuster  HPI:  56 y.o. AA male with a history of VT with MDT ICD in 2015 with a generator change in Jan 2018, CAD with CTO of the RCA and previous DES to the Dx and CFX artery approximatley 10 years ago in [place]. An Echo in May 2017 showed, inferolateral scar with an EF of 55-60% with well compensated chronic systolic and diastolic heart failure. He has long standing OSA- on CPAP, hyperlipidemia, and erectile dysfunction.   The was seen in [facility] ER 05/04/16 and was noted to be in VT. He underwent DCCV in the ER to SR. Amiodarone was added. Cath done 05/05/16 showed no culprit lesion, echo showed an EF of 60-65%.  He saw Dr Caryl Comes in July and the plan was to cut his Amiodarone back to 300 mg daily. He is in the office today for routine follow up. He says he has been doing well, no tachycardia, no unusual dyspnea, no chest pain or NTG use.    Current Outpatient Prescriptions  Medication Sig Dispense Refill  . amiodarone (PACERONE) 200 MG tablet Take 1.5 tablets (300 mg total) by mouth daily. 45 tablet 6  . amLODipine (NORVASC) 10 MG tablet TAKE 1 TABLET BY MOUTH DAILY 30 tablet 3  . aspirin 81 MG tablet Take 81 mg by mouth daily.    Marland Kitchen atorvastatin (LIPITOR) 80 MG tablet Take 1 tablet (80 mg total) by mouth daily. In the evening 30 tablet 5  . clopidogrel (PLAVIX) 75 MG tablet TAKE 1 TABLET BY MOUTH EVERY DAY WITH BREAKFAST 90 tablet 0  . COREG CR 80 MG 24 hr capsule TAKE 1 CAPSULE(80 MG) BY MOUTH DAILY 30 capsule 3  . hydrochlorothiazide (HYDRODIURIL) 25 MG tablet Take 1 tablet (25 mg total) by mouth daily. 90 tablet 0  . ibuprofen (ADVIL,MOTRIN) 400 MG tablet Take 1 tablet (400 mg total) by mouth every 6 (six) hours as needed for moderate pain. 30 tablet 0  . potassium chloride (K-DUR,KLOR-CON) 10 MEQ tablet TAKE 1 TABLET BY MOUTH EVERY DAY 90 tablet 3  .  sacubitril-valsartan (ENTRESTO) 97-103 MG Take 1 tablet by mouth 2 (two) times daily. 60 tablet 8  . sildenafil (VIAGRA) 50 MG tablet TAKE 1 TABLET BY MOUTH EVERY DAY AS NEEDED FOR ERECTILE DYSFUNCTION 30 tablet 1   No current facility-administered medications for this visit.     No Known Allergies  Past Medical History:  Diagnosis Date  . CAD (coronary artery disease) 12/01/2013  . Chronic combined systolic and diastolic CHF, NYHA class 1 ([place]) 12/01/2013  . Erectile dysfunction 12/01/2013  . HTN (hypertension) 12/01/2013  . Hyperlipidemia 12/01/2013  . Ischemic cardiomyopathy 12/01/2013  . Sleep apnea     Social History   Social History  . Marital status: Single    Spouse name: N/A  . Number of children: N/A  . Years of education: N/A   Occupational History  . Not on file.   Social History Main Topics  . Smoking status: Former Smoker    Packs/day: 0.50    Years: 30.00    Types: Cigarettes    Quit date: 05/31/2013  . Smokeless tobacco: Never Used  . Alcohol use No     Comment: ocassionally  . Drug use: Unknown  . Sexual activity: Not on file   Other Topics Concern  . Not on file   Social History Narrative  .  No narrative on file     Family History  Problem Relation Age of Onset  . Heart disease Mother   . Brain cancer Father   . Hypertension Daughter      Review of Systems: General: negative for chills, fever, night sweats or weight changes.  Cardiovascular: negative for chest pain, dyspnea on exertion, edema, orthopnea, palpitations, paroxysmal nocturnal dyspnea or shortness of breath Dermatological: negative for rash Respiratory: negative for cough or wheezing Urologic: negative for hematuria Abdominal: negative for nausea, vomiting, diarrhea, bright red blood per rectum, melena, or hematemesis Neurologic: negative for visual changes, syncope, or dizziness All other systems reviewed and are otherwise negative except as noted above.    Blood  pressure 130/86, pulse 84, height 6\' 6"  (1.981 m), weight 297 lb 6.4 oz (134.9 kg).  General appearance: alert, cooperative, no distress and moderately obese Neck: no carotid bruit and no JVD Lungs: clear to auscultation bilaterally Heart: regular rate and rhythm Abdomen: obese, non tender Extremities: extremities normal, atraumatic, no cyanosis or edema Pulses: 2+ and symmetric Skin: Skin color, texture, turgor normal. No rashes or lesions Neurologic: Grossly normal  EKG A paced  ASSESSMENT AND PLAN:   Ventricular tachycardia [facility]) Last admitted though ED May 2017 with VT- shocked in ED and Amiodarone added  OSA on CPAP Compliant  Ischemic cardiomyopathy Last EF 55-60% May 2017  HTN (hypertension) Controlled  ICD - dual chamber Medtronic programmed VVI MDT- device changed Jan 2018  Hyperlipidemia Due for lipids-on high dose statin Rx  CAD S/P percutaneous coronary angioplasty Chronic total occlusion of the right coronary artery, drug-eluting Taxus stents to the diagonal and circumflex in 2005 in [place]. Cath 05/06/16- no culprit lesion identified  Erectile dysfunction Pt asked for a refill on sildenifil   PLAN  I decreased his Amiodarone to 300 mg daily. He'll need CBC, CMET, TSH, fasting lipids. F/U Dr Sallyanne Kuster in 6 months.   Kerin Ransom PA-C 10/15/2017 10:04 AM

## 2017-10-15 NOTE — Assessment & Plan Note (Signed)
Due for lipids-on high dose statin Rx

## 2017-10-16 ENCOUNTER — Telehealth: Payer: Self-pay | Admitting: Cardiology

## 2017-10-16 DIAGNOSIS — D509 Iron deficiency anemia, unspecified: Secondary | ICD-10-CM

## 2017-10-16 MED ORDER — PANTOPRAZOLE SODIUM 40 MG PO TBEC
40.0000 mg | DELAYED_RELEASE_TABLET | Freq: Every day | ORAL | 11 refills | Status: DC
Start: 2017-10-16 — End: 2017-11-02

## 2017-10-16 NOTE — Telephone Encounter (Signed)
-----   Message from Erlene Quan, [place] sent at 10/16/2017  8:02 AM EDT ----- Pt has microcytic anemic, this is new. He needs stool guaiac cards and a repeat CBC with serum iron and TIBC in one week. Start Protonix 40 mg daily. He has no PCP. He'll need GI referral pending labs.    Kerin Ransom PA-C 10/16/2017 8:02 AM

## 2017-10-16 NOTE — Telephone Encounter (Signed)
Patient called w/results. Voiced understanding of PA recommendations/plan. Rx(s) sent to pharmacy electronically. He was advised to go to [facility] [address] for stool collection and come to our office for repeat labs end of next week.

## 2017-10-28 ENCOUNTER — Telehealth: Payer: Self-pay | Admitting: Cardiovascular Disease

## 2017-10-28 NOTE — Telephone Encounter (Signed)
Returned call to patient to notify him that I called on Friday 11/9 with reminder to have lab work and stool test done d/t low Hgb. He originally stated he could have testing done next week but advised it has been 2 weeks since last blood work so he needs to get this done ASAP. He states he may can get the testing done tomorrow.   cc: Lurena Joiner, [place] as Juluis Rainier

## 2017-10-28 NOTE — Telephone Encounter (Signed)
Notes recorded by Fidel Levy, RN on 10/23/2017 at 10:38 AM EST LM w/reminder to have repeat lab work & stool specimen ------  Notes recorded by Fidel Levy, RN on 10/16/2017 at 1:32 PM EDT See phone note. Med + lab testing ordered. ------  Notes recorded by Erlene Quan, PA-C on 10/16/2017 at 8:02 AM EDT Pt has microcytic anemic, this is new. He needs stool guaiac cards and a repeat CBC with serum iron and TIBC in one week. Start Protonix 40 mg daily. He has no PCP. He'll need GI referral pending labs.   Kerin Ransom PA-C 10/16/2017 8:02 AM

## 2017-10-28 NOTE — Telephone Encounter (Signed)
Pt says he was returning a call from Friday,but did not know who called him.

## 2017-10-29 ENCOUNTER — Other Ambulatory Visit: Payer: Self-pay | Admitting: *Deleted

## 2017-10-29 ENCOUNTER — Other Ambulatory Visit: Payer: Self-pay | Admitting: Cardiovascular Disease

## 2017-10-29 NOTE — Telephone Encounter (Signed)
Thanks, noted  Kerin Ransom PA-C 10/29/2017 7:53 AM

## 2017-10-30 LAB — CBC
Hematocrit: 28 % — ABNORMAL LOW (ref 37.5–51.0)
Hemoglobin: 8.4 g/dL — ABNORMAL LOW (ref 13.0–17.7)
MCH: 21.9 pg — ABNORMAL LOW (ref 26.6–33.0)
MCHC: 30 g/dL — ABNORMAL LOW (ref 31.5–35.7)
MCV: 73 fL — ABNORMAL LOW (ref 79–97)
Platelets: 358 10*3/uL (ref 150–379)
RBC: 3.83 x10E6/uL — ABNORMAL LOW (ref 4.14–5.80)
RDW: 16.8 % — ABNORMAL HIGH (ref 12.3–15.4)
WBC: 8.4 10*3/uL (ref 3.4–10.8)

## 2017-10-30 LAB — IRON AND TIBC
Iron Saturation: 6 % — CL (ref 15–55)
Iron: 21 ug/dL — ABNORMAL LOW (ref 38–169)
Total Iron Binding Capacity: 361 ug/dL (ref 250–450)
UIBC: 340 ug/dL (ref 111–343)

## 2017-11-02 ENCOUNTER — Telehealth: Payer: Self-pay

## 2017-11-02 DIAGNOSIS — D509 Iron deficiency anemia, unspecified: Secondary | ICD-10-CM

## 2017-11-02 MED ORDER — PANTOPRAZOLE SODIUM 40 MG PO TBEC: 40.0000 mg | DELAYED_RELEASE_TABLET | Freq: Every day | ORAL | 11 refills | Status: DC

## 2017-11-02 NOTE — Telephone Encounter (Signed)
-----   Message from Erlene Quan, [place] sent at 10/30/2017  7:55 AM EST ----- This pt needs to be on Protonix 40 mg daily and he needs to see GI (whoever is on call for unassigned since he has no PCP) for Iron deficiency anemia.  Lurena Joiner

## 2017-11-02 NOTE — Telephone Encounter (Signed)
Called patient with results. Patient verbalized understanding and agreed with plan. Referral to GI ordered. Rx(s) sent to patient's preferred pharmacy electronically.

## 2017-11-09 ENCOUNTER — Telehealth: Payer: Self-pay | Admitting: Cardiovascular Disease

## 2017-11-09 NOTE — Telephone Encounter (Signed)
Called and spoke to patient, advised to call [facility] [employer] for update on insurance approval.   Advised per last update, they were awaiting insurance approval.   # provided to [employer], patient will contact them to get updated status.   Advised to call back with any questions or concerns.  Patient aware and verbalized understanding.

## 2017-11-09 NOTE — Telephone Encounter (Signed)
New Message     Patient states he was to get a call back regarding CPAP equipment and he has never heard anything

## 2017-11-13 ENCOUNTER — Telehealth: Payer: Self-pay | Admitting: Cardiovascular Disease

## 2017-11-13 NOTE — Telephone Encounter (Signed)
Returned call to pt he states that he was told by the pharmacy that we need to call insurance and do a RSA. For his coreg, amlodipine and HCTZ. Asked around and we do not know what that is.  Called pharmacy they said that is a request for settlement authorization and to call the pharmacy help desk [phone] ID number [id number] and insurance is [place] help desk she states that pt needs to call adjuster and he must call them and arrange for these form to be sent. She states that this RSA form must be filled out every time for every medication that is refilled every time he needs a fill for these medications. She suggests to do #90 days at a time so we do not have to do it every month. Returned call to pt and informed him that he must call adjuster and have these forms faxed to [place] for completion. Reiterated to pt to make sure that we fill his medications #90 days at a time so we do not have to fill these form out every month. He will call adjuster and figure this all out and have the correct forms faxed to [place]. Fax number given to pt and made sure that pt knows to fax to Dr Croitoru's attention for faster service.

## 2017-11-13 NOTE — Telephone Encounter (Signed)
Gregory [name] is calling because he has some questions about his refill .  The pharmacy states that [name] needs to call his insurance company and complete a RSA . Please call

## 2017-12-09 ENCOUNTER — Emergency Department (HOSPITAL_COMMUNITY): Payer: Worker's Compensation

## 2017-12-09 ENCOUNTER — Other Ambulatory Visit: Payer: Self-pay

## 2017-12-09 ENCOUNTER — Encounter (HOSPITAL_COMMUNITY): Payer: Self-pay | Admitting: Emergency Medicine

## 2017-12-09 ENCOUNTER — Emergency Department (HOSPITAL_COMMUNITY)
Admission: EM | Admit: 2017-12-09 | Discharge: 2017-12-09 | Disposition: A | Discharge status: Home / Self Care | Payer: Worker's Compensation | Attending: Emergency Medicine | Admitting: Emergency Medicine

## 2017-12-09 DIAGNOSIS — I5042 Chronic combined systolic (congestive) and diastolic (congestive) heart failure: Secondary | ICD-10-CM | POA: Diagnosis not present

## 2017-12-09 DIAGNOSIS — Z79899 Other long term (current) drug therapy: Secondary | ICD-10-CM | POA: Insufficient documentation

## 2017-12-09 DIAGNOSIS — I110 Hypertensive heart disease with heart failure: Secondary | ICD-10-CM | POA: Insufficient documentation

## 2017-12-09 DIAGNOSIS — Z7901 Long term (current) use of anticoagulants: Secondary | ICD-10-CM | POA: Insufficient documentation

## 2017-12-09 DIAGNOSIS — M25561 Pain in right knee: Secondary | ICD-10-CM | POA: Insufficient documentation

## 2017-12-09 DIAGNOSIS — I2510 Atherosclerotic heart disease of native coronary artery without angina pectoris: Secondary | ICD-10-CM | POA: Insufficient documentation

## 2017-12-09 DIAGNOSIS — Z87891 Personal history of nicotine dependence: Secondary | ICD-10-CM | POA: Insufficient documentation

## 2017-12-09 MED ORDER — TRAMADOL HCL 50 MG PO TABS: 50.0000 mg | ORAL_TABLET | Freq: Four times a day (QID) | ORAL | 0 refills | Status: DC | PRN

## 2017-12-09 NOTE — Discharge Instructions (Signed)
Please read attached information regarding your condition. Take tramadol as needed for pain.  Take Tylenol as needed for pain as well. Wear knee sleeve as directed. Follow-up with orthopedist listed below for further evaluation. Return to ED for worsening symptoms, injuries, falls, numbness in legs, redness or warmth of your joint with fevers.

## 2017-12-09 NOTE — ED Triage Notes (Signed)
Pt reports right knee pain for the last 5 days, pt states he did slip in the drive was and may have stretched his knee.

## 2017-12-09 NOTE — ED Notes (Signed)
Knee sleeve applied, size large to right knee.

## 2017-12-09 NOTE — ED Provider Notes (Signed)
[place] EMERGENCY DEPARTMENT Provider Note   CSN: [id number] Arrival date & time: 12/09/17  0857     History   Chief Complaint Chief Complaint  Patient presents with  . Knee Pain    HPI Gregory [name] is a 56 y.o. male with a past medical history of CAD, cardiomyopathy, hypertension, who presents to ED for evaluation of 5-day history of progressively worsening right knee pain.  Describes the pain as aching and worse with ambulation.  He reports history of similar symptoms in the past that have resolved on their own.  He states that symptoms began after he slipped on some ice and hyperextended his knee.  He took 1 dose of Tylenol with codeine with no relief in his symptoms.  He denies any prior fracture, dislocations or procedures in the area, numbness in legs, back injuries, back pain, red hot or tender joint, history of arthritis, history of DVT or PE.  HPI  Past Medical History:  Diagnosis Date  . CAD (coronary artery disease) 12/01/2013  . Chronic combined systolic and diastolic CHF, NYHA class 1 ([place]) 12/01/2013  . Erectile dysfunction 12/01/2013  . HTN (hypertension) 12/01/2013  . Hyperlipidemia 12/01/2013  . Ischemic cardiomyopathy 12/01/2013  . Sleep apnea     Patient Active Problem List   Diagnosis Date Noted  . Ventricular tachycardia ([place]) 05/04/2016  . Ischemic cardiomyopathy 12/01/2013  . CAD S/P percutaneous coronary angioplasty 12/01/2013  . Erectile dysfunction 12/01/2013  . HTN (hypertension) 12/01/2013  . Hyperlipidemia 12/01/2013  . ICD - dual chamber Medtronic programmed VVI 12/01/2013  . OSA on CPAP 12/01/2013    Past Surgical History:  Procedure Laterality Date  . ACHILLES TENDON REPAIR     lft foot  . CARDIAC CATHETERIZATION N/A 05/05/2016   Procedure: Left Heart Cath and Coronary Angiography;  Surgeon: Leonie Man, MD;  Location: [facility];  Service: Cardiovascular;  Laterality: N/A;  . CARDIAC  DEFIBRILLATOR PLACEMENT    . EP IMPLANTABLE DEVICE N/A 12/19/2016   Procedure: ICD Generator Changeout;  Surgeon: [name], MD;  Location: [place];  Service: Cardiovascular;  Laterality: N/A;  . WRIST TENODESIS         Home Medications    Prior to Admission medications   Medication Sig Start Date End Date Taking? Authorizing Provider  amiodarone (PACERONE) 200 MG tablet Take 1.5 tablets (300 mg total) by mouth daily. 10/15/17   Erlene Quan, PA-C  amLODipine (NORVASC) 10 MG tablet TAKE 1 TABLET BY MOUTH DAILY 10/14/17   Croitoru, [name], MD  aspirin 81 MG tablet Take 81 mg by mouth daily.    [provider]  atorvastatin (LIPITOR) 80 MG tablet Take 1 tablet (80 mg total) by mouth daily. In the evening 09/01/17   Croitoru, [name], MD  clopidogrel (PLAVIX) 75 MG tablet TAKE 1 TABLET BY MOUTH EVERY DAY WITH BREAKFAST 10/29/17   Croitoru, [name], MD  COREG CR 80 MG 24 hr capsule TAKE 1 CAPSULE(80 MG) BY MOUTH DAILY 10/14/17   Croitoru, [name], MD  hydrochlorothiazide (HYDRODIURIL) 25 MG tablet Take 1 tablet (25 mg total) by mouth daily. 07/15/17   Croitoru, [name], MD  ibuprofen (ADVIL,MOTRIN) 400 MG tablet Take 1 tablet (400 mg total) by mouth every 6 (six) hours as needed for moderate pain. 12/03/16   Camnitz, Will Hassell Done, MD  pantoprazole (PROTONIX) 40 MG tablet Take 1 tablet (40 mg total) daily by mouth. 11/02/17   Kilroy, Doreene Burke, PA-C  potassium  chloride (K-DUR,KLOR-CON) 10 MEQ tablet TAKE 1 TABLET BY MOUTH EVERY DAY 04/28/17   Croitoru, [name], MD  sacubitril-valsartan (ENTRESTO) 97-103 MG Take 1 tablet by mouth 2 (two) times daily. 03/25/17   Croitoru, [name], MD  sildenafil (VIAGRA) 50 MG tablet TAKE 1 TABLET BY MOUTH EVERY DAY AS NEEDED FOR ERECTILE DYSFUNCTION 10/15/17   Erlene Quan, PA-C  traMADol (ULTRAM) 50 MG tablet Take 1 tablet (50 mg total) by mouth every 6 (six) hours as needed. 12/09/17   Delia Heady, PA-C    Family History Family History  Problem Relation  Age of Onset  . Heart disease Mother   . Brain cancer Father   . Hypertension Daughter     Social History Social History   Tobacco Use  . Smoking status: Former Smoker    Packs/day: 0.50    Years: 30.00    Pack years: 15.00    Types: Cigarettes    Last attempt to quit: 05/31/2013    Years since quitting: 4.5  . Smokeless tobacco: Never Used  Substance Use Topics  . Alcohol use: No    Comment: ocassionally  . Drug use: Not on file     Allergies   Patient has no known allergies.   Review of Systems Review of Systems  Constitutional: Negative for chills and fever.  Respiratory: Negative for shortness of breath.   Cardiovascular: Negative for chest pain.  Musculoskeletal: Positive for arthralgias and joint swelling. Negative for myalgias, neck pain and neck stiffness.  Skin: Negative for rash.  Neurological: Negative for weakness and numbness.     Physical Exam Updated Vital Signs BP 129/82 (BP Location: Right Arm)   Pulse 80   Temp 98.6 F (37 C) (Oral)   Resp 20   SpO2 99%   Physical Exam  Constitutional: He appears well-developed and well-nourished. No distress.  Nontoxic appearing and in no acute distress.  Pleasant.  Appears older than stated age.  HENT:  Head: Normocephalic and atraumatic.  Eyes: Conjunctivae and EOM are normal. No scleral icterus.  Neck: Normal range of motion.  Cardiovascular: Normal rate, regular rhythm and normal heart sounds.  Pulmonary/Chest: Effort normal and breath sounds normal. No respiratory distress.  Musculoskeletal: Normal range of motion. He exhibits edema and tenderness. He exhibits no deformity.  Mild tenderness to palpation at the lateral aspect of the right patella.  Moderate edema noted surrounding the patella.  Full active and passive range of motion of knee.  No calf tenderness noted.  No popliteal tenderness or swelling noted.  No pallor or erythema noted.  2+ DP pulse noted bilaterally.  Strength 5/5 and symmetrical  in bilateral lower extremities.  Sensation intact to light touch.  Neurological: He is alert.  Skin: No rash noted. He is not diaphoretic.  Psychiatric: He has a normal mood and affect.  Nursing note and vitals reviewed.    ED Treatments / Results  Labs (all labs ordered are listed, but only abnormal results are displayed) Labs Reviewed - No data to display  EKG  EKG Interpretation None       Radiology Dg Knee Complete 4 Views Right  Result Date: 12/09/2017 CLINICAL DATA:  Knee pain for 5 days EXAM: RIGHT KNEE - COMPLETE 4+ VIEW COMPARISON:  None. FINDINGS: No acute fracture or dislocation. Mild medial femorotibial compartment joint space narrowing marginal osteophytes. Tiny patellofemoral compartment marginal osteophytes. Moderate joint effusion. Peripheral vascular atherosclerotic disease. No soft tissue abnormality. IMPRESSION: No acute osseous injury of the right knee. Electronically  Signed   By: Kathreen Devoid   On: 12/09/2017 09:46    Procedures Procedures (including critical care time)  Medications Ordered in ED Medications - No data to display   Initial Impression / Assessment and Plan / ED Course  I have reviewed the triage vital signs and the nursing notes.  Pertinent labs & imaging results that were available during my care of the patient were reviewed by me and considered in my medical decision making (see chart for details).     Patient presents to ED for evaluation of 5-day history of progressively worsening right knee pain.  Describes it as achy and worse with ambulation.  Symptoms began after he slipped on ice and hyperextended his knee.  No previous fracture, dislocation or procedure in the area.  He denies any numbness in legs, red hot or tender joint, fevers, chest pain or shortness of breath or any history of DVT or PE.  On physical exam he is overall well-appearing.  There is some tenderness to palpation at the lateral aspect of the right patella with  moderate effusion noted.  He is able to perform active and passive range of motion with no issues.  There is no erythema or temperature change of joint.  Sensation intact light touch with 2+ DP pulse noted.  X-ray with no acute finding.  This could be due to arthritis or possibly ligamentous injury.  I have low suspicion for infectious or vascular cause of his symptoms being that his pulses are normal and the appearance of the knee is not concerning.  Will give knee sleeve, pain medication and advised patient to follow-up with orthopedist for further evaluation.  Patient unable to take NSAIDs so will give short course of tramadol.  [place] narcotic database reviewed with no discrepancies.  Patient appears stable for discharge at this time.  Strict return precautions given.  Final Clinical Impressions(s) / ED Diagnoses   Final diagnoses:  Acute pain of right knee    ED Discharge Orders        Ordered    traMADol (ULTRAM) 50 MG tablet  Every 6 hours PRN     12/09/17 1009     Portions of this note were generated with Dragon dictation software. Dictation errors may occur despite best attempts at proofreading.    Delia Heady, PA-C 12/09/17 1017    Forde Dandy, MD 12/09/17 [phone]

## 2017-12-16 ENCOUNTER — Telehealth: Payer: Self-pay | Admitting: Cardiovascular Disease

## 2017-12-16 NOTE — Telephone Encounter (Signed)
Returned the call to the patient. He stated that he was in the ED a week ago. He had slipped and fell on his knee. He stated that while in the ED, he was told he may have PAD and he needed to have an MRI completed. According to the ED note, his pulses were normal and there was a low suspicion that it was vascular related. He stated that he has an appointment with Dr. Caryl Comes 2/5 and will follow up with him. He has been advised to call us back if he has any further needs.

## 2017-12-16 NOTE — Telephone Encounter (Signed)
New Message      Patient states he was seen in Er for knee injury and they told him to get MRI done, because he has the start of PAD in his leg , please call and advise

## 2017-12-17 ENCOUNTER — Other Ambulatory Visit: Payer: Self-pay | Admitting: Cardiology

## 2017-12-29 ENCOUNTER — Ambulatory Visit (INDEPENDENT_AMBULATORY_CARE_PROVIDER_SITE_OTHER): Payer: Worker's Compensation | Admitting: *Deleted

## 2017-12-29 DIAGNOSIS — I255 Ischemic cardiomyopathy: Secondary | ICD-10-CM | POA: Diagnosis not present

## 2017-12-30 ENCOUNTER — Other Ambulatory Visit: Payer: Self-pay | Admitting: Cardiovascular Disease

## 2017-12-30 NOTE — Telephone Encounter (Signed)
REFILL 

## 2017-12-31 NOTE — Progress Notes (Signed)
Remote ICD transmission.   

## 2018-01-01 ENCOUNTER — Encounter: Payer: Self-pay | Admitting: Cardiology

## 2018-01-03 LAB — CUP PACEART REMOTE DEVICE CHECK
Brady Statistic AP VS Percent: 39.84 %
Brady Statistic AS VP Percent: 0.07 %
Brady Statistic AS VS Percent: 59.68 %
Brady Statistic RV Percent Paced: 0.51 %
HIGH POWER IMPEDANCE MEASURED VALUE: 43 Ohm
HIGH POWER IMPEDANCE MEASURED VALUE: 52 Ohm
Implantable Lead Implant Date: 20101117
Implantable Lead Location: 753860
Implantable Lead Model: 5076
Implantable Lead Model: 7121
Implantable Pulse Generator Implant Date: 20180105
Lead Channel Impedance Value: 285 Ohm
Lead Channel Impedance Value: 399 Ohm
Lead Channel Pacing Threshold Amplitude: 0.5 V
Lead Channel Pacing Threshold Amplitude: 1 V
Lead Channel Pacing Threshold Pulse Width: 0.4 ms
Lead Channel Sensing Intrinsic Amplitude: 15 mV
Lead Channel Sensing Intrinsic Amplitude: 4 mV
Lead Channel Sensing Intrinsic Amplitude: 4 mV
Lead Channel Setting Pacing Amplitude: 1.5 V
Lead Channel Setting Pacing Amplitude: 2 V
Lead Channel Setting Sensing Sensitivity: 0.6 mV
MDC IDC LEAD IMPLANT DT: 20051120
MDC IDC LEAD LOCATION: 753859
MDC IDC MSMT BATTERY REMAINING LONGEVITY: 118 mo
MDC IDC MSMT BATTERY VOLTAGE: 3 V
MDC IDC MSMT LEADCHNL RA PACING THRESHOLD PULSEWIDTH: 0.4 ms
MDC IDC MSMT LEADCHNL RV IMPEDANCE VALUE: 418 Ohm
MDC IDC MSMT LEADCHNL RV SENSING INTR AMPL: 15 mV
MDC IDC SESS DTM: 20190115052203
MDC IDC SET LEADCHNL RV PACING PULSEWIDTH: 0.4 ms
MDC IDC STAT BRADY AP VP PERCENT: 0.4 %
MDC IDC STAT BRADY RA PERCENT PACED: 40.24 %

## 2018-01-04 ENCOUNTER — Encounter: Payer: Self-pay | Admitting: *Deleted

## 2018-01-06 ENCOUNTER — Encounter: Payer: Self-pay | Admitting: Cardiology

## 2018-01-13 ENCOUNTER — Other Ambulatory Visit: Payer: Self-pay | Admitting: Cardiovascular Disease

## 2018-01-13 ENCOUNTER — Ambulatory Visit: Payer: Worker's Compensation | Admitting: Cardiovascular Disease

## 2018-01-19 ENCOUNTER — Ambulatory Visit (INDEPENDENT_AMBULATORY_CARE_PROVIDER_SITE_OTHER): Payer: Worker's Compensation | Admitting: Internal Medicine

## 2018-01-19 ENCOUNTER — Encounter: Payer: Self-pay | Admitting: Internal Medicine

## 2018-01-19 VITALS — BP 124/82 | HR 74 | Ht 78.0 in | Wt 286.4 lb

## 2018-01-19 DIAGNOSIS — I5022 Chronic systolic (congestive) heart failure: Secondary | ICD-10-CM

## 2018-01-19 DIAGNOSIS — I255 Ischemic cardiomyopathy: Secondary | ICD-10-CM

## 2018-01-19 DIAGNOSIS — Z95810 Presence of automatic (implantable) cardiac defibrillator: Secondary | ICD-10-CM | POA: Diagnosis not present

## 2018-01-19 DIAGNOSIS — E785 Hyperlipidemia, unspecified: Secondary | ICD-10-CM | POA: Diagnosis not present

## 2018-01-19 DIAGNOSIS — I472 Ventricular tachycardia, unspecified: Secondary | ICD-10-CM

## 2018-01-19 DIAGNOSIS — I493 Ventricular premature depolarization: Secondary | ICD-10-CM

## 2018-01-19 MED ORDER — AMIODARONE HCL 200 MG PO TABS: 300.0000 mg | ORAL_TABLET | Freq: Every day | ORAL | 3 refills | Status: DC

## 2018-01-19 NOTE — Patient Instructions (Addendum)
Medication Instructions:  Your physician recommends that you continue on your current medications as directed. Please refer to the Current Medication list given to you today.  Labwork: None ordered.  Testing/Procedures: None ordered.  Follow-Up: Your physician recommends that you schedule a follow-up appointment in: One Year with Dr Caryl Comes  Remote monitoring is used to monitor your ICD from home. This monitoring reduces the number of office visits required to check your device to one time per year. It allows [place] to keep an eye on the functioning of your device to ensure it is working properly. You are scheduled for a device check from home on 03/30/2018. You may send your transmission at any time that day. If you have a wireless device, the transmission will be sent automatically. After your physician reviews your transmission, you will receive a postcard with your next transmission date.   Any Other Special Instructions Will Be Listed Below (If Applicable).     If you need a refill on your cardiac medications before your next appointment, please call your pharmacy.

## 2018-01-19 NOTE — Progress Notes (Signed)
Patient Care Team: Patient, No Pcp Per as PCP - General (General Practice)   HPI  Gregory [name] is a 57 y.o. male Seen in followup for ventricular tachycardia associated with a previously implanted ICD in the context of congestive heart failure ischemic cardiomyopathy. Ejection fraction echo 2014--45-50% and Myoview 2010 extensive inferolateral scar. He had a type I have talked with  underwent ICD generator replacement 1/18  He presented 5/17 not feeling good. He was found to be in sustained monomorphic ventricular tachycardia rate of 160 below his detection rate. His device was reprogrammed to 150.  Cath 5/17  Cath Ost RCA to Dist RCA lesion, 100% stenosed. Known occluded. RPDA fills via collaterals with minimal filling of PL system 1. Post Atrio lesion, 80% stenosed. 2. Lat 1st Mrg lesion, 100% stenosed. Appears to be jailed from prior stent. Fills via collaterals 3. Widely patent stent in OM1, D1 and mid LAD 4. There is mild to moderate left ventricular systolic dysfunction. Severely elevated LVEDP  He's been managed by combination of amiodarone and mexiletine, the latter stopped 2/2 flatus  Tolerating higher doses of amio, no interval VT  Date TSH LFTs K  5/17      3.3  9/17  2.24 21 5.1   No chest pain sob or edema;   No interval VT of which he is aware   Patient denies symptoms of GI intolerance, sun sensitivity, neurological symptoms attributable to amiodarone.  Surveillance laboratories were in normal limits when checked 1 years  ago        Past Medical History:  Diagnosis Date  . CAD (coronary artery disease) 12/01/2013  . Chronic combined systolic and diastolic CHF, NYHA class 1 ([place]) 12/01/2013  . Erectile dysfunction 12/01/2013  . HTN (hypertension) 12/01/2013  . Hyperlipidemia 12/01/2013  . Ischemic cardiomyopathy 12/01/2013  . Sleep apnea     Past Surgical History:  Procedure Laterality Date  . ACHILLES TENDON REPAIR     lft foot    . CARDIAC CATHETERIZATION N/A 05/05/2016   Procedure: Left Heart Cath and Coronary Angiography;  Surgeon: Leonie Man, MD;  Location: [facility];  Service: Cardiovascular;  Laterality: N/A;  . CARDIAC DEFIBRILLATOR PLACEMENT    . EP IMPLANTABLE DEVICE N/A 12/19/2016   Procedure: ICD Generator Changeout;  Surgeon: [name], MD;  Location: [facility];  Service: Cardiovascular;  Laterality: N/A;  . WRIST TENODESIS      Current Outpatient Medications  Medication Sig Dispense Refill  . amiodarone (PACERONE) 200 MG tablet Take 1.5 tablets (300 mg total) by mouth daily. 45 tablet 6  . amLODipine (NORVASC) 10 MG tablet TAKE 1 TABLET BY MOUTH DAILY 30 tablet 3  . aspirin 81 MG tablet Take 81 mg by mouth daily.    Marland Kitchen atorvastatin (LIPITOR) 80 MG tablet Take 1 tablet (80 mg total) by mouth daily. In the evening 30 tablet 5  . clopidogrel (PLAVIX) 75 MG tablet TAKE 1 TABLET BY MOUTH EVERY DAY WITH BREAKFAST 90 tablet 2  . COREG CR 80 MG 24 hr capsule TAKE 1 CAPSULE(80 MG) BY MOUTH DAILY 30 capsule 3  . ENTRESTO 97-103 MG TAKE 1 TABLET BY MOUTH TWICE DAILY 60 tablet 3  . hydrochlorothiazide (HYDRODIURIL) 25 MG tablet TAKE 1 TABLET BY MOUTH EVERY DAY 90 tablet 0  . ibuprofen (ADVIL,MOTRIN) 400 MG tablet Take 1 tablet (400 mg total) by mouth every 6 (six) hours as needed for moderate pain. 30 tablet 0  .  pantoprazole (PROTONIX) 40 MG tablet Take 1 tablet (40 mg total) daily by mouth. 30 tablet 11  . potassium chloride (K-DUR,KLOR-CON) 10 MEQ tablet TAKE 1 TABLET BY MOUTH EVERY DAY 90 tablet 3  . sildenafil (VIAGRA) 50 MG tablet TAKE ONE TABLET BY MOUTH DAILY AS NEEDED FOR ERECTILE DYSFUNCTION 30 tablet 0  . traMADol (ULTRAM) 50 MG tablet Take 1 tablet (50 mg total) by mouth every 6 (six) hours as needed. 8 tablet 0   No current facility-administered medications for this visit.     No Known Allergies  Review of Systems negative except from HPI and PMH  Physical Exam BP 124/82    Pulse 74   Ht 6\' 6"  (1.981 m)   Wt 286 lb 6.4 oz (129.9 kg)   BMI 33.10 kg/m  Well developed and nourished in no acute distress HENT normal Neck supple with JVP-flat Carotids brisk and full without bruits Clear Regular rate and rhythm, no murmurs or gallops Abd-soft with active BS without hepatomegaly No Clubbing cyanosis edema Skin-warm and dry A & Oriented  Grossly normal sensory and motor function  ECG  Sinus 74 Intervals 20/13/40 Inferior q waves      Assessment and  Plan  PVCs few and far between   Ventricular tachycardia    ICD  medtronic  The patient's device was interrogated.  The information was reviewed. No changes were made in the programming.    Ischemic Cardiomyopathy    CHF chronic systolic    No interval  VT  Will plan to decrease amio to 200 mg when he sees Dr Thermon Leyland if no further VT  Without symptoms of ischemia  Euvolemic continue current meds  Will check amio labs

## 2018-01-21 ENCOUNTER — Other Ambulatory Visit: Payer: Self-pay | Admitting: Cardiology

## 2018-01-21 LAB — CUP PACEART INCLINIC DEVICE CHECK
Date Time Interrogation Session: 20190207110944
Implantable Lead Implant Date: 20051120
Implantable Lead Model: 7121
MDC IDC LEAD IMPLANT DT: 20101117
MDC IDC LEAD LOCATION: 753859
MDC IDC LEAD LOCATION: 753860
MDC IDC PG IMPLANT DT: 20180105

## 2018-02-13 ENCOUNTER — Other Ambulatory Visit: Payer: Self-pay | Admitting: Cardiovascular Disease

## 2018-02-15 NOTE — Telephone Encounter (Signed)
Rx(s) sent to pharmacy electronically.  

## 2018-02-17 ENCOUNTER — Other Ambulatory Visit: Payer: Self-pay | Admitting: Cardiology

## 2018-03-11 ENCOUNTER — Other Ambulatory Visit: Payer: Self-pay | Admitting: Cardiovascular Disease

## 2018-03-12 NOTE — Telephone Encounter (Signed)
REFILL 

## 2018-03-26 ENCOUNTER — Other Ambulatory Visit: Payer: Self-pay | Admitting: Cardiology

## 2018-03-30 ENCOUNTER — Encounter: Payer: Self-pay | Admitting: Cardiology

## 2018-03-30 ENCOUNTER — Ambulatory Visit (INDEPENDENT_AMBULATORY_CARE_PROVIDER_SITE_OTHER): Payer: Worker's Compensation | Admitting: *Deleted

## 2018-03-30 ENCOUNTER — Other Ambulatory Visit: Payer: Self-pay | Admitting: *Deleted

## 2018-03-30 ENCOUNTER — Telehealth: Payer: Self-pay | Admitting: Cardiology

## 2018-03-30 DIAGNOSIS — I255 Ischemic cardiomyopathy: Secondary | ICD-10-CM | POA: Diagnosis not present

## 2018-03-30 MED ORDER — SILDENAFIL CITRATE 50 MG PO TABS: ORAL_TABLET | ORAL | 0 refills | Status: DC

## 2018-03-30 NOTE — Telephone Encounter (Signed)
°*  STAT* If patient is at the pharmacy, call can be transferred to refill team.   1. Which medications need to be refilled? (please list name of each medication and dose if known) Viagra 50mg   2. Which pharmacy/location (including street and city if local pharmacy) is medication to be sent to?[facility] [address] [place]  3. Do they need a 30 day or 90 day supply? [place]

## 2018-03-30 NOTE — Progress Notes (Signed)
Remote ICD transmission.   

## 2018-04-01 ENCOUNTER — Encounter: Payer: Self-pay | Admitting: Cardiology

## 2018-04-01 LAB — CUP PACEART REMOTE DEVICE CHECK
Battery Remaining Longevity: 115 mo
Battery Voltage: 2.98 V
Brady Statistic RA Percent Paced: 36 %
HIGH POWER IMPEDANCE MEASURED VALUE: 39 Ohm
HighPow Impedance: 51 Ohm
Implantable Lead Implant Date: 20051120
Implantable Lead Location: 753859
Implantable Lead Model: 5076
Implantable Pulse Generator Implant Date: 20180105
Lead Channel Impedance Value: 285 Ohm
Lead Channel Impedance Value: 418 Ohm
Lead Channel Pacing Threshold Amplitude: 0.625 V
Lead Channel Sensing Intrinsic Amplitude: 15.75 mV
Lead Channel Sensing Intrinsic Amplitude: 15.75 mV
Lead Channel Sensing Intrinsic Amplitude: 3.875 mV
Lead Channel Setting Pacing Amplitude: 1.5 V
Lead Channel Setting Pacing Pulse Width: 0.4 ms
Lead Channel Setting Sensing Sensitivity: 0.6 mV
MDC IDC LEAD IMPLANT DT: 20101117
MDC IDC LEAD LOCATION: 753860
MDC IDC MSMT LEADCHNL RA IMPEDANCE VALUE: 361 Ohm
MDC IDC MSMT LEADCHNL RA PACING THRESHOLD PULSEWIDTH: 0.4 ms
MDC IDC MSMT LEADCHNL RA SENSING INTR AMPL: 3.875 mV
MDC IDC MSMT LEADCHNL RV PACING THRESHOLD AMPLITUDE: 1 V
MDC IDC MSMT LEADCHNL RV PACING THRESHOLD PULSEWIDTH: 0.4 ms
MDC IDC SESS DTM: 20190416052503
MDC IDC SET LEADCHNL RV PACING AMPLITUDE: 2.25 V
MDC IDC STAT BRADY AP VP PERCENT: 0.42 %
MDC IDC STAT BRADY AP VS PERCENT: 35.57 %
MDC IDC STAT BRADY AS VP PERCENT: 0.11 %
MDC IDC STAT BRADY AS VS PERCENT: 63.9 %
MDC IDC STAT BRADY RV PERCENT PACED: 0.56 %

## 2018-04-18 ENCOUNTER — Other Ambulatory Visit: Payer: Self-pay | Admitting: Cardiovascular Disease

## 2018-04-19 NOTE — Telephone Encounter (Signed)
REFILL 

## 2018-04-20 ENCOUNTER — Encounter: Payer: Self-pay | Admitting: Internal Medicine

## 2018-04-20 ENCOUNTER — Encounter: Payer: Self-pay | Admitting: Cardiology

## 2018-04-26 ENCOUNTER — Telehealth: Payer: Self-pay

## 2018-04-26 NOTE — Telephone Encounter (Signed)
**Note De-Identified  Obfuscation** Dollene Primrose, RN  to Me        11:55 AM  Hi Jeani Hawking   Have you ever heard of this before?   Thanks   Lorren           11:24 AM  Damian Leavell, RN routed this conversation to Dollene Primrose, RN    [place] Pulling, [facility]  to [name], MD        11:24 AM  They are saying that there is some new [place] regulation, that requires some form be filled out, for all of the medications prescribe to me in my chart..I am a little confused so if you could call my claims rep. Vern Claude at [phone] he can probably help.    Apr 21, 2018   Dollene Primrose, RN  to Wendie Chess [place]        8:41 AM  Hi Jasin,   What medication is it that you need help with?   Thanks,   Lorren    Last read by [name] at 11:17 AM on 04/22/2018.  Apr 20, 2018         11:32 AM  Theodoro Parma, RN routed this conversation to Dollene Primrose, RN    [place] Pulling, [facility]  to [name], MD        11:26 AM  Dear Dr. Caryl Comes.  On my last visit with you I was having a problem with my refills. You told me you could possibly help. I also sent this request to PA-C. [place].  I have workers Health and safety inspector through, [employer], in [place]. They told me that [place] has changed their requirements for prescription refills. I was told by my adjuster that, their is some form, that needs to be filled out , by your office or, they will not cover the cost of my medications. My adjuster also told me that they have sent at least two requests to your office and have not received a response. If someone could call my representative, Vern Claude, at [phone] and see what the requirement is that they are talking about. They approved the refills for my next 30 days but told me they would not approve any more unless that form was filled out.   Monica Zahler    04/26/2018 The pt is primarily seen in our NL  office. I have not received any faxes concerning his medications.  The pt has also sent a MYCHART message to [facility], PA-c also.

## 2018-04-27 NOTE — Telephone Encounter (Signed)
**Note De-Identified  Obfuscation** I called Vern Claude with [employer], in [place] (workers compensation as requested by the pt. Per Harrell Gave the pts primary cardiologist needs to fill out a form so the pt can get his medications through workers comp. I have advised him that this pt's primary cardiologist is Dr Sallyanne Kuster at our [place] office. Harrell Gave states that he will reach out to that office and thanked me for calling him.   Will route this message to Dr Sallyanne Kuster and Kerin Ransom, PA-c as Juluis Rainier.

## 2018-05-07 ENCOUNTER — Other Ambulatory Visit: Payer: Self-pay | Admitting: Cardiovascular Disease

## 2018-05-07 ENCOUNTER — Other Ambulatory Visit: Payer: Self-pay | Admitting: Cardiology

## 2018-05-07 MED ORDER — SACUBITRIL-VALSARTAN 97-103 MG PO TABS: 1.0000 | ORAL_TABLET | Freq: Two times a day (BID) | ORAL | 0 refills | Status: DC

## 2018-05-07 NOTE — Telephone Encounter (Signed)
Rx request sent to pharmacy.  

## 2018-05-07 NOTE — Telephone Encounter (Signed)
New message   Pt states that he is out of medication.  *STAT* If patient is at the pharmacy, call can be transferred to refill team.   1. Which medications need to be refilled? (please list name of each medication and dose if known) Entresto   2. Which pharmacy/location (including street and city if local pharmacy) is medication to be sent to? [place]  3. Do they need a 30 day or 90 day supply? 30 day

## 2018-05-13 ENCOUNTER — Ambulatory Visit (INDEPENDENT_AMBULATORY_CARE_PROVIDER_SITE_OTHER): Payer: Worker's Compensation | Admitting: Cardiovascular Disease

## 2018-05-13 ENCOUNTER — Encounter: Payer: Self-pay | Admitting: Cardiovascular Disease

## 2018-05-13 VITALS — BP 115/72 | HR 73 | Ht 78.0 in | Wt 286.2 lb

## 2018-05-13 DIAGNOSIS — I472 Ventricular tachycardia, unspecified: Secondary | ICD-10-CM

## 2018-05-13 DIAGNOSIS — Z95810 Presence of automatic (implantable) cardiac defibrillator: Secondary | ICD-10-CM

## 2018-05-13 DIAGNOSIS — I10 Essential (primary) hypertension: Secondary | ICD-10-CM | POA: Diagnosis not present

## 2018-05-13 DIAGNOSIS — E7800 Pure hypercholesterolemia, unspecified: Secondary | ICD-10-CM | POA: Diagnosis not present

## 2018-05-13 DIAGNOSIS — G4733 Obstructive sleep apnea (adult) (pediatric): Secondary | ICD-10-CM | POA: Diagnosis not present

## 2018-05-13 DIAGNOSIS — Z79899 Other long term (current) drug therapy: Secondary | ICD-10-CM

## 2018-05-13 DIAGNOSIS — I2510 Atherosclerotic heart disease of native coronary artery without angina pectoris: Secondary | ICD-10-CM | POA: Diagnosis not present

## 2018-05-13 DIAGNOSIS — I5042 Chronic combined systolic (congestive) and diastolic (congestive) heart failure: Secondary | ICD-10-CM | POA: Diagnosis not present

## 2018-05-13 DIAGNOSIS — N522 Drug-induced erectile dysfunction: Secondary | ICD-10-CM | POA: Diagnosis not present

## 2018-05-13 DIAGNOSIS — Z5181 Encounter for therapeutic drug level monitoring: Secondary | ICD-10-CM

## 2018-05-13 DIAGNOSIS — Z9989 Dependence on other enabling machines and devices: Secondary | ICD-10-CM

## 2018-05-13 DIAGNOSIS — Z9861 Coronary angioplasty status: Secondary | ICD-10-CM

## 2018-05-13 MED ORDER — AMLODIPINE BESYLATE 5 MG PO TABS: 5.0000 mg | ORAL_TABLET | Freq: Every day | ORAL | 3 refills | Status: DC

## 2018-05-13 NOTE — Progress Notes (Signed)
Patient ID: Gregory [name], male   DOB: [birth year], 57 y.o.   MRN: [id number]    Cardiology Office Note    Date:  05/15/2018   ID:  Gregory [name], DOB [birth year], MRN [id number]  PCP:  Patient, No Pcp Per  Cardiologist:  Jolyn Nap, M.D.; Sanda Klein, MD   Chief Complaint  Patient presents with  . Coronary Artery Disease    History of Present Illness:  Gregory [name] is a 57 y.o. 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  In May 2017 he had palpitations, visual changes and slight dizziness and subsequently presented with sustained monomorphic ventricular tachycardia below ICD detection rate. He underwent urgent cardioversion in the emergency department. He subsequently underwent right and left heart catheterization showing no real change in coronary anatomy. A lateral branch of his first oblique marginal artery was subtotally occluded and there was high-grade disease in his PLA artery, neither one amenable to revascularization. His left ventricular end-diastolic pressure was however severely elevated. Amiodarone was initiated. VT detection rate was decreased to 150 bpm after hospitalization and subsequently decreased to 130 bpm when he returned with recurrent VT at his office appointment with Dr. Caryl Comes on June 27. Pre-shock antitachycardia pacing was made more aggressive. Mexiletine was stopped. Her remains on amiodarone at maintenance dose 200 mg daily, without side effects so far.  He underwent elective replacement of her generator January 2019, saw Dr. Caryl Comes in clinic in  February and has not had arrhythmia problems since then.  He describes shortness of breath when he walks fast when he has to climb the stairs.  He does not have orthopnea or PND and he denies syncope or palpitations.  He does not have angina pectoris.  He continues to have problems with erectile dysfunction generally well managed with sildenafil.  He denies leg edema intermittent claudication or focal neurological events.  He has orthostatic dizziness frequently.  He is on maximum dose of Entresto and Coreg CR.  He remains on amiodarone.  He takes aspirin and clopidogrel as well as a statin.  He knows he is not supposed to take nitrates if he takes sildenafil.    Past Medical History:  Diagnosis Date  . CAD (coronary artery disease) 12/01/2013  . Chronic combined systolic and diastolic CHF, NYHA class 1 ([place]) 12/01/2013  . Erectile dysfunction 12/01/2013  . HTN (hypertension) 12/01/2013  . Hyperlipidemia 12/01/2013  . Ischemic cardiomyopathy 12/01/2013  . Sleep apnea     Past Surgical History:  Procedure Laterality Date  . ACHILLES TENDON REPAIR     lft foot  . CARDIAC CATHETERIZATION N/A 05/05/2016   Procedure: Left Heart Cath and Coronary Angiography;  Surgeon: Leonie Man, MD;  Location: [facility];  Service: Cardiovascular;  Laterality: N/A;  . CARDIAC DEFIBRILLATOR PLACEMENT    . EP IMPLANTABLE DEVICE N/A 12/19/2016   Procedure: ICD Generator Changeout;  Surgeon: [name], MD;  Location: [facility];  Service: Cardiovascular;  Laterality: N/A;  . WRIST TENODESIS      Outpatient Medications Prior to Visit  Medication Sig Dispense Refill  . amiodarone (PACERONE) 200 MG tablet Take 1.5 tablets (300 mg total) by mouth daily. 135 tablet 3  .  aspirin 81 MG tablet Take 81 mg by mouth daily.    Marland Kitchen atorvastatin (LIPITOR) 80 MG tablet TAKE ONE TABLET BY MOUTH DAILY IN THE EVENING 30 tablet 11  . carvedilol (COREG CR) 80 MG 24 hr capsule Take 1 capsule (80 mg total)  by mouth daily. 30 capsule 5  . clopidogrel (PLAVIX) 75 MG tablet TAKE 1 TABLET BY MOUTH EVERY DAY WITH BREAKFAST 90 tablet 2  . hydrochlorothiazide (HYDRODIURIL) 25 MG tablet TAKE 1 TABLET BY MOUTH EVERY DAY 90 tablet 1  . ibuprofen (ADVIL,MOTRIN) 400 MG tablet Take 1 tablet (400 mg total) by mouth every 6 (six) hours as needed for moderate pain. 30 tablet 0  . pantoprazole (PROTONIX) 40 MG tablet Take 1 tablet (40 mg total) daily by mouth. 30 tablet 11  . sacubitril-valsartan (ENTRESTO) 97-103 MG Take 1 tablet by mouth 2 (two) times daily. 60 tablet 0  . sildenafil (VIAGRA) 50 MG tablet TAKE ONE TABLET BY MOUTH DAILY AS NEEDED FOR ERECTILE DYSFUNCTION 30 tablet 0  . traMADol (ULTRAM) 50 MG tablet Take 1 tablet (50 mg total) by mouth every 6 (six) hours as needed. 8 tablet 0  . amLODipine (NORVASC) 10 MG tablet Take 1 tablet (10 mg total) by mouth daily. 30 tablet 5  . potassium chloride (K-DUR,KLOR-CON) 10 MEQ tablet TAKE 1 TABLET BY MOUTH EVERY DAY 90 tablet 3  . sildenafil (VIAGRA) 50 MG tablet TAKE 1 TABLET BY MOUTH DAILY AS NEEDED FOR ERECTILE DYSFUNCTION 30 tablet 0   No facility-administered medications prior to visit.      Allergies:   Patient has no known allergies.   Social History   Socioeconomic History  . Marital status: Single    Spouse name: Not on file  . Number of children: Not on file  . Years of education: Not on file  . Highest education level: Not on file  Occupational History  . Not on file  Social Needs  . Financial resource strain: Not on file  . Food insecurity:    Worry: Not on file    Inability: Not on file  . Transportation needs:    Medical: Not on file    Non-medical: Not on file  Tobacco Use  . Smoking status: Former Smoker    Packs/day: 0.50    Years: 30.00    Pack years: 15.00    Types: Cigarettes    Last attempt to quit: 05/31/2013    Years since quitting: 4.9  . Smokeless tobacco: Never Used  Substance and Sexual Activity  . Alcohol use:  No    Comment: ocassionally  . Drug use: Not on file  . Sexual activity: Not on file  Lifestyle  . Physical activity:    Days per week: Not on file    Minutes per session: Not on file  . Stress: Not on file  Relationships  . Social connections:    Talks on phone: Not on file    Gets together: Not on file    Attends religious service: Not on file    Active member of club or organization: Not on file    Attends meetings of clubs or organizations: Not on file    Relationship status: Not on file  Other Topics Concern  . Not on file  Social History Narrative  . Not on file     Family History:  The patient's family history includes Brain cancer in his father; Heart disease in his mother; Hypertension in his daughter.   ROS:  Please see the history of present illness.    ROS All other systems reviewed and are negative.   PHYSICAL EXAM:   VS:  BP 115/72   Pulse 73   Ht 6\' 6"  (1.981 m)   Wt 286 lb 3.2 oz (129.8 kg)   BMI 33.07 kg/m    GEN: Well nourished, well developed, in no acute distress  HEENT: normal  Neck: no JVD, carotid bruits, or masses Cardiac: RRR; no murmurs, rubs, or gallops,no edema ,healthy left subclavian defibrillator site Respiratory:  clear to auscultation bilaterally, normal work of breathing GI: soft, nontender, nondistended, + BS MS: no deformity or atrophy  Skin: warm and dry, no rash Neuro:  Alert and Oriented x 3, Strength and sensation are intact Psych: euthymic mood, full affect  Wt Readings from Last 3 Encounters:  05/13/18 286 lb 3.2 oz (129.8 kg)  01/19/18 286 lb 6.4 oz (129.9 kg)  10/15/17 297 lb 6.4 oz (134.9 kg)      Studies/Labs Reviewed:   EKG:  EKG is ordered today.  The ekg ordered today atrial paced, ventricular sensed rhythm with nonspecific intraventricular conduction delay, inferior Q waves and left axis deviation.  QRS 126 ms, QTC 495 ms Recent Labs: 10/15/2017: ALT 27; BUN 16; Creatinine, Ser 1.12; Potassium 4.8; Sodium  139; TSH 2.020 10/29/2017: Hemoglobin 8.4; Platelets 358   Lipid Panel    Component Value Date/Time   CHOL 132 10/15/2017 1013   TRIG 57 10/15/2017 1013   HDL 34 (L) 10/15/2017 1013   CHOLHDL 3.9 10/15/2017 1013   CHOLHDL 4.2 05/05/2016 0526   VLDL 11 05/05/2016 0526   LDLCALC 87 10/15/2017 1013     ASSESSMENT:    1. Chronic combined systolic and diastolic heart failure ([place])   2. VT (ventricular tachycardia) ([place])   3. Encounter for monitoring amiodarone therapy   4. ICD (implantable cardioverter-defibrillator) in place   5. CAD S/P percutaneous coronary angioplasty   6. Essential hypertension   7. Hypercholesterolemia   8. Drug-induced erectile dysfunction   9. OSA on CPAP      PLAN:  In order of problems listed above:  1. CHF: NYHA class 2, clinically euvolemic without taking loop diuretics (he does take a thiazide). On Entresto and max dose beta blocker.  Amlodipine offers no recent advantage, especially since he does not have angina.  We will decrease the dose to see if we can avoid the episodes of dizziness. 2. VT: he has a large inferolateral scar which is probably the source of VT. is been well over a year since he last had a meaningful event; tolerating amiodarone without any serious side effects.  3. Amiodarone: As per the recommendations in Dr. Olin Pia note from February, since he has not had arrhythmia we will reduce amiodarone dose to only 200 mg daily.  Needs TSH and liver function tests every 6 months (due now).  Should report he lung symptoms promptly. Also advised that he needs ophthalmologic exams yearly and needs to avoid sun exposure. 4. ICD: recent change out, normal device function (followed by Dr. Caryl Comes). 5. CAD: no angina, no evidence of reversible ischemia on last functional study (July 2017). Angiography (May 2017) does not show any large conduits that require revascularization  6. HTN: well-controlled, possibly excessively reduced.  We will cut the  amlodipine dose back to 5 mg once daily 7. HLP: Not quite at target LDL under 70, but he is on maximum atorvastatin dose.  Reinforced lifestyle changes. 8. ED: Good response  to PDE 5 inhibitors, he knows that he should never mix these with nitrates. 9. OSA: He now has a new CPAP device with which he reports 100% compliance    Medication Adjustments/Labs and Tests Ordered: Current medicines are reviewed at length with the patient today.  Concerns regarding medicines are outlined above.  Medication changes, Labs and Tests ordered today are listed in the Patient Instructions below. Patient Instructions  Dr Sallyanne Kuster has recommended making the following medication changes: 1. DECREASE Amlodipine to 5 mg daily  Your physician recommends that you schedule a follow-up appointment in 12 months. You will receive a reminder letter in the mail two months in advance. If you don't receive a letter, please call our office to schedule the follow-up appointment.  If you need a refill on your cardiac medications before your next appointment, please call your pharmacy.   Decrease amiodarone to 200 mg once daily  Signed, Sanda Klein, MD  05/15/2018 3:27 PM    [facility] [address], [place], [place]  [address] Phone: [phone]; Fax: [phone]

## 2018-05-13 NOTE — Patient Instructions (Signed)
Dr Sallyanne Kuster has recommended making the following medication changes: 1. DECREASE Amlodipine to 5 mg daily  Your physician recommends that you schedule a follow-up appointment in 12 months. You will receive a reminder letter in the mail two months in advance. If you don't receive a letter, please call our office to schedule the follow-up appointment.  If you need a refill on your cardiac medications before your next appointment, please call your pharmacy.

## 2018-05-14 ENCOUNTER — Other Ambulatory Visit: Payer: Self-pay | Admitting: Cardiovascular Disease

## 2018-05-14 NOTE — Telephone Encounter (Signed)
REFILL 

## 2018-05-18 ENCOUNTER — Telehealth: Payer: Self-pay

## 2018-05-18 DIAGNOSIS — E785 Hyperlipidemia, unspecified: Secondary | ICD-10-CM

## 2018-05-18 DIAGNOSIS — I472 Ventricular tachycardia, unspecified: Secondary | ICD-10-CM

## 2018-05-18 DIAGNOSIS — I5022 Chronic systolic (congestive) heart failure: Secondary | ICD-10-CM

## 2018-05-18 DIAGNOSIS — I255 Ischemic cardiomyopathy: Secondary | ICD-10-CM

## 2018-05-18 DIAGNOSIS — I493 Ventricular premature depolarization: Secondary | ICD-10-CM

## 2018-05-18 DIAGNOSIS — Z79899 Other long term (current) drug therapy: Secondary | ICD-10-CM

## 2018-05-18 MED ORDER — AMIODARONE HCL 200 MG PO TABS: 200.0000 mg | ORAL_TABLET | Freq: Every day | ORAL | 3 refills | Status: DC

## 2018-05-18 NOTE — Telephone Encounter (Signed)
-----   Message from Sanda Klein, MD sent at 05/15/2018  3:23 PM EDT ----- Please tell him that after reviewing the situation with Dr. Caryl Comes we have decided he should decrease his amiodarone dose to 200 mg (only 1 tablet) once a day.  Also remind him that he is due to have TSH and CMET (I believe he told him there is a plan for this to be checked with his PCP in the next few weeks) MCr

## 2018-05-18 NOTE — Telephone Encounter (Signed)
Patient advised of recommendations. Medication updated and labs ordered. Patient advised to have labs performed with PCP next time he had blood work performed. Patient prefers to labs completed here and will have these done at his convenience.

## 2018-06-04 ENCOUNTER — Telehealth: Payer: Self-pay | Admitting: Cardiovascular Disease

## 2018-06-04 NOTE — Telephone Encounter (Signed)
Spoke with pt and advised of Dr. Lurline Del recommendation. Pt verbalized understanding.

## 2018-06-04 NOTE — Telephone Encounter (Signed)
Spoke with pt who states he is still experiencing dizziness when he standing and feels the heat and sun has made it worse. He also states during the dizzy spell he feels a tingling sensation down his left leg and when looking at the sun he sees white. He reports it doesn't happen every time he stand just occasionally. BP reading for the last 3 days he reports has been around 85/55. Routing to Dr. Loletha Grayer for recommendation.

## 2018-06-04 NOTE — Telephone Encounter (Signed)
His symptoms are related to low blood pressure, worsened by orthostatic hypotension.   Please as him to completely discontinue his amlodipine.  The effects of this medication will slowly wear off over a week or 2. Have him take it easy and avoid the heat of the middle of the day. These call us back next week with his blood pressure and symptoms. MCr

## 2018-06-04 NOTE — Telephone Encounter (Signed)
STAT if patient feels like he/she is going to faint   1) Are you dizzy now? no 2) Do you feel faint or have you passed out? Sometimes he feels like he might pass out, but not at this time  3) Do you have any other symptoms? Dizzy only when he stands up and the sunlight bother his eyes  4) Have you checked your HR and BP (record if available)?  Last time he took it yesterday it was 85/55 .

## 2018-06-07 ENCOUNTER — Other Ambulatory Visit: Payer: Self-pay

## 2018-06-07 ENCOUNTER — Inpatient Hospital Stay (HOSPITAL_COMMUNITY)
Admission: EM | Admit: 2018-06-07 | Discharge: 2018-06-09 | DRG: 101 | Disposition: A | Discharge status: Home / Self Care | Payer: PRIVATE HEALTH INSURANCE | Attending: Internal Medicine | Admitting: Internal Medicine

## 2018-06-07 ENCOUNTER — Emergency Department (HOSPITAL_COMMUNITY): Payer: PRIVATE HEALTH INSURANCE

## 2018-06-07 ENCOUNTER — Encounter (HOSPITAL_COMMUNITY): Payer: Self-pay | Admitting: Emergency Medicine

## 2018-06-07 DIAGNOSIS — Z87891 Personal history of nicotine dependence: Secondary | ICD-10-CM

## 2018-06-07 DIAGNOSIS — I959 Hypotension, unspecified: Secondary | ICD-10-CM | POA: Diagnosis present

## 2018-06-07 DIAGNOSIS — G40909 Epilepsy, unspecified, not intractable, without status epilepticus: Secondary | ICD-10-CM

## 2018-06-07 DIAGNOSIS — K219 Gastro-esophageal reflux disease without esophagitis: Secondary | ICD-10-CM | POA: Diagnosis present

## 2018-06-07 DIAGNOSIS — Z95810 Presence of automatic (implantable) cardiac defibrillator: Secondary | ICD-10-CM

## 2018-06-07 DIAGNOSIS — I952 Hypotension due to drugs: Secondary | ICD-10-CM | POA: Diagnosis not present

## 2018-06-07 DIAGNOSIS — E785 Hyperlipidemia, unspecified: Secondary | ICD-10-CM | POA: Diagnosis present

## 2018-06-07 DIAGNOSIS — Z7902 Long term (current) use of antithrombotics/antiplatelets: Secondary | ICD-10-CM

## 2018-06-07 DIAGNOSIS — Z79899 Other long term (current) drug therapy: Secondary | ICD-10-CM

## 2018-06-07 DIAGNOSIS — R569 Unspecified convulsions: Secondary | ICD-10-CM | POA: Diagnosis not present

## 2018-06-07 DIAGNOSIS — Z7982 Long term (current) use of aspirin: Secondary | ICD-10-CM

## 2018-06-07 DIAGNOSIS — I255 Ischemic cardiomyopathy: Secondary | ICD-10-CM | POA: Diagnosis present

## 2018-06-07 DIAGNOSIS — E876 Hypokalemia: Secondary | ICD-10-CM | POA: Diagnosis not present

## 2018-06-07 DIAGNOSIS — E871 Hypo-osmolality and hyponatremia: Secondary | ICD-10-CM | POA: Diagnosis not present

## 2018-06-07 DIAGNOSIS — I5042 Chronic combined systolic (congestive) and diastolic (congestive) heart failure: Secondary | ICD-10-CM | POA: Diagnosis present

## 2018-06-07 DIAGNOSIS — I110 Hypertensive heart disease with heart failure: Secondary | ICD-10-CM | POA: Diagnosis present

## 2018-06-07 DIAGNOSIS — R55 Syncope and collapse: Secondary | ICD-10-CM

## 2018-06-07 DIAGNOSIS — D509 Iron deficiency anemia, unspecified: Secondary | ICD-10-CM | POA: Diagnosis not present

## 2018-06-07 DIAGNOSIS — G4730 Sleep apnea, unspecified: Secondary | ICD-10-CM | POA: Diagnosis present

## 2018-06-07 DIAGNOSIS — I2510 Atherosclerotic heart disease of native coronary artery without angina pectoris: Secondary | ICD-10-CM | POA: Diagnosis present

## 2018-06-07 DIAGNOSIS — G40409 Other generalized epilepsy and epileptic syndromes, not intractable, without status epilepticus: Principal | ICD-10-CM | POA: Diagnosis present

## 2018-06-07 DIAGNOSIS — D649 Anemia, unspecified: Secondary | ICD-10-CM

## 2018-06-07 DIAGNOSIS — I5032 Chronic diastolic (congestive) heart failure: Secondary | ICD-10-CM | POA: Diagnosis not present

## 2018-06-07 LAB — CBG MONITORING, ED
GLUCOSE-CAPILLARY: 95 mg/dL (ref 65–99)
Glucose-Capillary: 122 mg/dL — ABNORMAL HIGH (ref 65–99)

## 2018-06-07 LAB — COMPREHENSIVE METABOLIC PANEL
ALT: 19 U/L (ref 17–63)
AST: 20 U/L (ref 15–41)
Albumin: 3.6 g/dL (ref 3.5–5.0)
Alkaline Phosphatase: 61 U/L (ref 38–126)
Anion gap: 10 (ref 5–15)
BUN: 21 mg/dL — ABNORMAL HIGH (ref 6–20)
CALCIUM: 9 mg/dL (ref 8.9–10.3)
CHLORIDE: 103 mmol/L (ref 101–111)
CO2: 23 mmol/L (ref 22–32)
CREATININE: 1.25 mg/dL — AB (ref 0.61–1.24)
Glucose, Bld: 94 mg/dL (ref 65–99)
POTASSIUM: 4.4 mmol/L (ref 3.5–5.1)
SODIUM: 136 mmol/L (ref 135–145)
TOTAL PROTEIN: 7.2 g/dL (ref 6.5–8.1)
Total Bilirubin: 0.5 mg/dL (ref 0.3–1.2)

## 2018-06-07 LAB — ETHANOL: Alcohol, Ethyl (B): <10 mg/dL (ref <10)

## 2018-06-07 LAB — FOLATE: Folate: 10.3 ng/mL (ref >5.9)

## 2018-06-07 LAB — CBC
HCT: 25.4 % — ABNORMAL LOW (ref 39.0–52.0)
Hemoglobin: 6.9 g/dL — CL (ref 13.0–17.0)
MCH: 17.8 pg — ABNORMAL LOW (ref 26.0–34.0)
MCHC: 27.2 g/dL — ABNORMAL LOW (ref 30.0–36.0)
MCV: 65.6 fL — AB (ref 78.0–100.0)
PLATELETS: 415 10*3/uL — AB (ref 150–400)
RBC: 3.87 MIL/uL — AB (ref 4.22–5.81)
RDW: 19.9 % — AB (ref 11.5–15.5)
WBC: 9.3 10*3/uL (ref 4.0–10.5)

## 2018-06-07 LAB — PREPARE RBC (CROSSMATCH)

## 2018-06-07 LAB — POC OCCULT BLOOD, ED: Fecal Occult Bld: NEGATIVE

## 2018-06-07 LAB — RETICULOCYTES
RBC.: 3.81 MIL/uL — ABNORMAL LOW (ref 4.22–5.81)
Retic Count, Absolute: 64.8 10*3/uL (ref 19.0–186.0)
Retic Ct Pct: 1.7 % (ref 0.4–3.1)

## 2018-06-07 LAB — APTT: aPTT: 28 seconds (ref 24–36)

## 2018-06-07 LAB — IRON AND TIBC
Iron: 13 ug/dL — ABNORMAL LOW (ref 45–182)
Saturation Ratios: 3 % — ABNORMAL LOW (ref 17.9–39.5)
TIBC: 390 ug/dL (ref 250–450)
UIBC: 377 ug/dL

## 2018-06-07 LAB — FERRITIN: Ferritin: 3 ng/mL — ABNORMAL LOW (ref 24–336)

## 2018-06-07 LAB — PROTIME-INR
INR: 1.17
Prothrombin Time: 14.8 seconds (ref 11.4–15.2)

## 2018-06-07 LAB — VITAMIN B12: Vitamin B-12: 210 pg/mL (ref 180–914)

## 2018-06-07 LAB — AMMONIA: Ammonia: 18 umol/L (ref 9–35)

## 2018-06-07 LAB — ABO/RH: ABO/RH(D): B POS

## 2018-06-07 MED ORDER — CARVEDILOL PHOSPHATE ER 20 MG PO CP24
80.0000 mg | ORAL_CAPSULE | Freq: Every day | ORAL | Status: DC
  Filled 2018-06-07: qty 4

## 2018-06-07 MED ORDER — SACUBITRIL-VALSARTAN 97-103 MG PO TABS
1.0000 | ORAL_TABLET | Freq: Two times a day (BID) | ORAL | Status: DC
  Administered 2018-06-07: 1 via ORAL
  Filled 2018-06-07 (×2): qty 1

## 2018-06-07 MED ORDER — DIPHENHYDRAMINE HCL 50 MG/ML IJ SOLN
25.0000 mg | Freq: Once | INTRAMUSCULAR | Status: AC
  Administered 2018-06-07: 25 mg via INTRAVENOUS
  Filled 2018-06-07: qty 1

## 2018-06-07 MED ORDER — ATORVASTATIN CALCIUM 40 MG PO TABS
80.0000 mg | ORAL_TABLET | Freq: Every evening | ORAL | Status: DC
  Administered 2018-06-07 – 2018-06-08 (×2): 80 mg via ORAL
  Filled 2018-06-07 (×2): qty 2

## 2018-06-07 MED ORDER — ASPIRIN EC 81 MG PO TBEC
81.0000 mg | DELAYED_RELEASE_TABLET | Freq: Every day | ORAL | Status: DC
  Administered 2018-06-08 – 2018-06-09 (×2): 81 mg via ORAL
  Filled 2018-06-07 (×2): qty 1

## 2018-06-07 MED ORDER — LORAZEPAM 2 MG/ML IJ SOLN
INTRAMUSCULAR | Status: AC
  Filled 2018-06-07: qty 1

## 2018-06-07 MED ORDER — LEVETIRACETAM IN NACL 1000 MG/100ML IV SOLN
1000.0000 mg | Freq: Once | INTRAVENOUS | Status: AC
  Administered 2018-06-07: 1000 mg via INTRAVENOUS
  Filled 2018-06-07: qty 100

## 2018-06-07 MED ORDER — ACETAMINOPHEN 650 MG RE SUPP: 650.0000 mg | Freq: Four times a day (QID) | RECTAL | Status: DC | PRN

## 2018-06-07 MED ORDER — SODIUM CHLORIDE 0.9% IV SOLUTION
Freq: Once | INTRAVENOUS | Status: AC
  Administered 2018-06-07: 23:00:00 via INTRAVENOUS

## 2018-06-07 MED ORDER — FUROSEMIDE 10 MG/ML IJ SOLN
20.0000 mg | Freq: Once | INTRAMUSCULAR | Status: AC
  Administered 2018-06-08: 20 mg via INTRAVENOUS
  Filled 2018-06-07: qty 2

## 2018-06-07 MED ORDER — HYDROCHLOROTHIAZIDE 25 MG PO TABS
25.0000 mg | ORAL_TABLET | Freq: Every day | ORAL | Status: DC
  Filled 2018-06-07: qty 1

## 2018-06-07 MED ORDER — PANTOPRAZOLE SODIUM 40 MG PO TBEC
40.0000 mg | DELAYED_RELEASE_TABLET | Freq: Every day | ORAL | Status: DC
  Administered 2018-06-08 – 2018-06-09 (×2): 40 mg via ORAL
  Filled 2018-06-07 (×2): qty 1

## 2018-06-07 MED ORDER — AMIODARONE HCL 200 MG PO TABS
200.0000 mg | ORAL_TABLET | Freq: Every day | ORAL | Status: DC
  Administered 2018-06-09: 200 mg via ORAL
  Filled 2018-06-07 (×2): qty 1

## 2018-06-07 MED ORDER — POTASSIUM CHLORIDE CRYS ER 10 MEQ PO TBCR
10.0000 meq | EXTENDED_RELEASE_TABLET | Freq: Every day | ORAL | Status: DC
  Administered 2018-06-08 – 2018-06-09 (×2): 10 meq via ORAL
  Filled 2018-06-07 (×2): qty 1

## 2018-06-07 MED ORDER — ACETAMINOPHEN 325 MG PO TABS
650.0000 mg | ORAL_TABLET | Freq: Once | ORAL | Status: AC
  Administered 2018-06-07: 650 mg via ORAL
  Filled 2018-06-07: qty 2

## 2018-06-07 MED ORDER — ENOXAPARIN SODIUM 40 MG/0.4ML ~~LOC~~ SOLN: 40.0000 mg | SUBCUTANEOUS | Status: DC

## 2018-06-07 MED ORDER — LEVETIRACETAM IN NACL 500 MG/100ML IV SOLN
500.0000 mg | Freq: Two times a day (BID) | INTRAVENOUS | Status: DC
  Filled 2018-06-07: qty 100

## 2018-06-07 MED ORDER — SODIUM CHLORIDE 0.9 % IV SOLN
10.0000 mL/h | Freq: Once | INTRAVENOUS | Status: AC
  Administered 2018-06-07: 10 mL/h via INTRAVENOUS

## 2018-06-07 MED ORDER — ACETAMINOPHEN 325 MG PO TABS: 650.0000 mg | ORAL_TABLET | Freq: Four times a day (QID) | ORAL | Status: DC | PRN

## 2018-06-07 MED ORDER — CLOPIDOGREL BISULFATE 75 MG PO TABS
75.0000 mg | ORAL_TABLET | Freq: Every day | ORAL | Status: DC
  Administered 2018-06-08 – 2018-06-09 (×2): 75 mg via ORAL
  Filled 2018-06-07 (×2): qty 1

## 2018-06-07 NOTE — Progress Notes (Addendum)
CSW noted in chart that NT stated to call pt's daughter [name].  Pt had given verbal permission to the CSW to call his family.  CSW called pt's daughter [name] at ph: [phone]  and left a HIPPA-compliant VM.  CSW received a return call from pt's daughter. Pt's daughter stated her father lives with her and has a pacemaker and an "A-FIB".  Per pt's daughter, pt has been functioning fine, but felt dizzy and pt's daughter feels pt should be fine to return home with no further social work needs needing addressed at this time.  Please reconsult if future social work needs arise.  CSW signing off, as social work intervention is no longer needed.  Alphonse Guild. Ayaansh Smail, LCSW, LCAS, CSI Clinical Social Worker Ph: [phone]

## 2018-06-07 NOTE — ED Provider Notes (Signed)
[place] DEPT Provider Note   CSN: [id number] Arrival date & time: 06/07/18  1333     History   Chief Complaint Chief Complaint  Patient presents with  . Altered Mental Status    HPI Timothy California Mossberg is a 57 y.o. male with history of CAD, CHF, hypertension, hyperlipidemia, ischemic cardiomyopathy, and sleep apnea presents for evaluation of acute onset of altered mental status.  Patient was brought in by his 2 friends who state that they met up with him at around 1:20 PM to go get lunch.  They state that they were in a vehicle and the patient was in the passenger seat when  his "eyes rolled in the back of his head and he became unresponsive ".  They state this lasted approximately 10 minutes.  They stated that he appeared to be at his mental status baseline at that time.  Just prior to my arrival while the patient was in the room he had an episode in which he stared off into space for approximately 20 seconds and then had generalized tonic-clonic seizure-like activity.  He has no known history of seizures.  He denies alcohol intake or recreational drug use but his friends state that he smokes marijuana on a daily basis.  He states he has been compliant with his home medications including his Plavix.  His amlodipine was recently decreased to 5 mg daily and his amiodarone was decreased to 200 mg once daily due to decrease in his blood pressure when he was reevaluated by his cardiologist Dr. Sallyanne Kuster on 05/13/2018.  He was then instructed to completely discontinue his amlodipine due to persistent lightheadedness.  At this time, the patient denies any headaches, vision changes, nausea, vomiting, abdominal pain, chest pain, or shortness of breath.  He does not think that his defibrillator has gone off in the past few years.  He denies any history of melena or hematochezia.  He does tell me that for the past few weeks he has been feeling lightheaded when he gets  up to stand and short of breath with exertion.  Denies syncope.  He appears somewhat postictal.  The history is provided by the patient and a friend.    Past Medical History:  Diagnosis Date  . CAD (coronary artery disease) 12/01/2013  . Chronic combined systolic and diastolic CHF, NYHA class 1 ([place]) 12/01/2013  . Erectile dysfunction 12/01/2013  . HTN (hypertension) 12/01/2013  . Hyperlipidemia 12/01/2013  . Ischemic cardiomyopathy 12/01/2013  . Sleep apnea     Patient Active Problem List   Diagnosis Date Noted  . Seizure ([place]) 06/07/2018  . Anemia 06/07/2018  . Ventricular tachycardia ([place]) 05/04/2016  . Ischemic cardiomyopathy 12/01/2013  . CAD S/P percutaneous coronary angioplasty 12/01/2013  . Erectile dysfunction 12/01/2013  . Essential hypertension 12/01/2013  . Hypercholesterolemia 12/01/2013  . ICD - dual chamber [employer]onic programmed VVI 12/01/2013  . OSA on CPAP 12/01/2013    Past Surgical History:  Procedure Laterality Date  . ACHILLES TENDON REPAIR     lft foot  . CARDIAC CATHETERIZATION N/A 05/05/2016   Procedure: Left Heart Cath and Coronary Angiography;  Surgeon: Leonie Man, MD;  Location: [facility];  Service: Cardiovascular;  Laterality: N/A;  . CARDIAC DEFIBRILLATOR PLACEMENT    . EP IMPLANTABLE DEVICE N/A 12/19/2016   Procedure: ICD Generator Changeout;  Surgeon: [name], MD;  Location: [facility];  Service: Cardiovascular;  Laterality: N/A;  . WRIST TENODESIS  Home Medications    Prior to Admission medications   Medication Sig Start Date End Date Taking? Authorizing Provider  amiodarone (PACERONE) 200 MG tablet Take 1 tablet (200 mg total) by mouth daily. 05/18/18  Yes Croitoru, [name], MD  amLODipine (NORVASC) 10 MG tablet Take 5 mg by mouth daily.   Yes [provider]  aspirin 81 MG tablet Take 81 mg by mouth daily.   Yes [provider]  atorvastatin (LIPITOR) 80 MG tablet TAKE ONE TABLET BY  MOUTH DAILY IN THE EVENING 03/12/18  Yes Croitoru, [name], MD  carvedilol (COREG CR) 80 MG 24 hr capsule Take 1 capsule (80 mg total) by mouth daily. 02/15/18  Yes Croitoru, [name], MD  clopidogrel (PLAVIX) 75 MG tablet TAKE 1 TABLET BY MOUTH EVERY DAY WITH BREAKFAST 10/29/17  Yes Croitoru, [name], MD  hydrochlorothiazide (HYDRODIURIL) 25 MG tablet TAKE 1 TABLET BY MOUTH EVERY DAY 04/19/18  Yes Troy Sine, MD  pantoprazole (PROTONIX) 40 MG tablet Take 1 tablet (40 mg total) daily by mouth. 11/02/17  Yes Kilroy, Luke K, PA-C  potassium chloride (K-DUR,KLOR-CON) 10 MEQ tablet TAKE 1 TABLET BY MOUTH EVERY DAY 05/14/18  Yes Croitoru, [name], MD  sacubitril-valsartan (ENTRESTO) 97-103 MG Take 1 tablet by mouth 2 (two) times daily. 05/07/18  Yes Croitoru, [name], MD  sildenafil (VIAGRA) 50 MG tablet TAKE ONE TABLET BY MOUTH DAILY AS NEEDED FOR ERECTILE DYSFUNCTION Patient taking differently: Take 50 mg by mouth daily as needed for erectile dysfunction.  05/07/18  Yes Croitoru, [name], MD    Family History Family History  Problem Relation Age of Onset  . Heart disease Mother   . Brain cancer Father   . Hypertension Daughter     Social History Social History   Tobacco Use  . Smoking status: Former Smoker    Packs/day: 0.50    Years: 30.00    Pack years: 15.00    Types: Cigarettes    Last attempt to quit: 05/31/2013    Years since quitting: 5.0  . Smokeless tobacco: Never Used  Substance Use Topics  . Alcohol use: No    Comment: ocassionally  . Drug use: Not on file     Allergies   Patient has no known allergies.   Review of Systems Review of Systems  Constitutional: Negative for chills and fever.  Respiratory: Positive for shortness of breath (Exertional).   Cardiovascular: Negative for chest pain.  Gastrointestinal: Negative for abdominal pain, blood in stool, constipation, diarrhea, nausea and vomiting.  Neurological: Positive for seizures, syncope and light-headedness. Negative for  weakness, numbness and headaches.  All other systems reviewed and are negative.    Physical Exam Updated Vital Signs BP 103/62 (BP Location: Left Arm)   Pulse 61   Temp 98.4 F (36.9 C) (Oral)   Resp 18   Ht '6\' 6"'  (1.981 m)   Wt 126.2 kg (278 lb 3.5 oz)   SpO2 98%   BMI 32.15 kg/m   Physical Exam  Constitutional: He is oriented to person, place, and time. He appears well-developed and well-nourished. No distress.  HENT:  Head: Normocephalic and atraumatic.  No intraoral injury. no battle sign, no raccoon eyes, no rhinorrhea.  Eyes: Pupils are equal, round, and reactive to light. Conjunctivae and EOM are normal. Right eye exhibits no discharge. Left eye exhibits no discharge.  Pale palpebral conjunctive a  Neck: Normal range of motion. Neck supple. No JVD present. No tracheal deviation present.  Cardiovascular: Normal rate, regular rhythm and intact  distal pulses.  2+ radial and DP/PT pulses bilaterally, Homans sign absent bilaterally, no lower extremity edema, no palpable cords, compartments are soft   Pulmonary/Chest: Effort normal and breath sounds normal. He exhibits no tenderness.  Abdominal: Soft. Bowel sounds are normal. He exhibits no distension. There is no tenderness. There is no guarding.  Genitourinary:  Genitourinary Comments: Examination performed in the presence of a chaperone.  No frank rectal bleeding.  Scant amount of stool in the rectal vault but  the small amount obtained appears to be bright red  Musculoskeletal: Normal range of motion. He exhibits no edema.  Moves extremities spontaneously.  Good grip strength bilaterally.  No tenderness to palpation of the chest or spine.  Neurological: He is alert and oriented to person, place, and time. No cranial nerve deficit or sensory deficit.  Mildly dysarthric speech, no facial droop.  Sensation intact to soft touch of extremities.  Moving extremities spontaneously, somewhat erratically at times.  Follows some commands  but not all.  Alert and oriented to person, place, and time but not events.  Appears mildly confused.  Skin: Skin is warm and dry. No erythema. There is pallor.  Psychiatric: He has a normal mood and affect. His behavior is normal.  Nursing note and vitals reviewed.    ED Treatments / Results  Labs (all labs ordered are listed, but only abnormal results are displayed) Labs Reviewed  COMPREHENSIVE METABOLIC PANEL - Abnormal; Notable for the following components:      Result Value   BUN 21 (*)    Creatinine, Ser 1.25 (*)    All other components within normal limits  CBC - Abnormal; Notable for the following components:   RBC 3.87 (*)    Hemoglobin 6.9 (*)    HCT 25.4 (*)    MCV 65.6 (*)    MCH 17.8 (*)    MCHC 27.2 (*)    RDW 19.9 (*)    Platelets 415 (*)    All other components within normal limits  IRON AND TIBC - Abnormal; Notable for the following components:   Iron 13 (*)    Saturation Ratios 3 (*)    All other components within normal limits  FERRITIN - Abnormal; Notable for the following components:   Ferritin 3 (*)    All other components within normal limits  RETICULOCYTES - Abnormal; Notable for the following components:   RBC. 3.81 (*)    All other components within normal limits  CBG MONITORING, ED - Abnormal; Notable for the following components:   Glucose-Capillary 122 (*)    All other components within normal limits  ETHANOL  AMMONIA  PROTIME-INR  APTT  VITAMIN B12  FOLATE  RAPID URINE DRUG SCREEN, HOSP PERFORMED  URINALYSIS, ROUTINE W REFLEX MICROSCOPIC  HIV ANTIBODY (ROUTINE TESTING)  COMPREHENSIVE METABOLIC PANEL  CBC  TSH  TROPONIN I  TROPONIN I  MULTIPLE MYELOMA PANEL, SERUM  GLIADIN ANTIBODIES, SERUM  TISSUE TRANSGLUTAMINASE, IGA  RETICULIN ANTIBODIES, IGA W TITER  TROPONIN I  CBG MONITORING, ED  POC OCCULT BLOOD, ED  TYPE AND SCREEN  ABO/RH  PREPARE RBC (CROSSMATCH)  PREPARE RBC (CROSSMATCH)    EKG EKG  Interpretation  Date/Time:  Monday June 07 2018 13:43:27 EDT Ventricular Rate:  53 PR Interval:    QRS Duration: 145 QT Interval:  454 QTC Calculation: 427 R Axis:   -47 Text Interpretation:  Atrial-paced complexes Nonspecific IVCD with LAD Inferior infarct, old Abnormal lateral Q waves Baseline wander in lead(s) V6 paced  complexes new compared to  last tracing , noted on other  tracings previously Confirmed by Dorie Rank [phone]) on 06/07/2018 3:14:45 PM   Radiology Ct Head Wo Contrast  Result Date: 06/07/2018 CLINICAL DATA:  57 y/o  M; Seizure, new, nontraumatic, >40 yrs. EXAM: CT HEAD WITHOUT CONTRAST TECHNIQUE: Contiguous axial images were obtained from the base of the skull through the vertex without intravenous contrast. COMPARISON:  None. FINDINGS: Brain: No evidence of acute infarction, hemorrhage, hydrocephalus, extra-axial collection or mass lesion/mass effect. Few scattered foci of hypoattenuation are present throughout white matter compatible with mild chronic microvascular ischemic changes. Mild brain parenchymal volume loss. The sella turcica. Vascular: Calcific atherosclerosis of the carotid siphons and vertebral arteries. Skull: Normal. Negative for fracture or focal lesion. Sinuses/Orbits: No acute finding. Other: None. IMPRESSION: 1. No acute intracranial abnormality identified. 2. Mild chronic microvascular ischemic changes and parenchymal volume loss of the brain. Electronically Signed   By: Kristine Garbe M.D.   On: 06/07/2018 17:01    Procedures .Critical Care Performed by: Renita Papa, PA-C Authorized by: Renita Papa, PA-C   Critical care provider statement:    Critical care time (minutes):  40   Critical care was necessary to treat or prevent imminent or life-threatening deterioration of the following conditions:  Circulatory failure   Critical care was time spent personally by me on the following activities:  Development of treatment plan with patient or  surrogate, discussions with consultants, examination of patient, obtaining history from patient or surrogate, ordering and review of laboratory studies, ordering and review of radiographic studies, re-evaluation of patient's condition, review of old charts and ordering and performing treatments and interventions   (including critical care time)  Medications Ordered in ED Medications  levETIRAcetam (KEPPRA) IVPB 500 mg/100 mL premix (has no administration in time range)  amiodarone (PACERONE) tablet 200 mg (has no administration in time range)  aspirin EC tablet 81 mg (has no administration in time range)  atorvastatin (LIPITOR) tablet 80 mg (80 mg Oral Given 06/07/18 2314)  carvedilol (COREG CR) 24 hr capsule 80 mg (has no administration in time range)  clopidogrel (PLAVIX) tablet 75 mg (has no administration in time range)  hydrochlorothiazide (HYDRODIURIL) tablet 25 mg (has no administration in time range)  pantoprazole (PROTONIX) EC tablet 40 mg (has no administration in time range)  potassium chloride (K-DUR,KLOR-CON) CR tablet 10 mEq (has no administration in time range)  sacubitril-valsartan (ENTRESTO) 97-103 mg per tablet (1 tablet Oral Given 06/07/18 2307)  acetaminophen (TYLENOL) tablet 650 mg (has no administration in time range)    Or  acetaminophen (TYLENOL) suppository 650 mg (has no administration in time range)  furosemide (LASIX) injection 20 mg (has no administration in time range)  0.9 %  sodium chloride infusion (10 mL/hr Intravenous New Bag/Given 06/07/18 1745)  levETIRAcetam (KEPPRA) IVPB 1000 mg/100 mL premix (0 mg Intravenous Stopped 06/07/18 2321)  0.9 %  sodium chloride infusion (Manually program via Guardrails IV Fluids) ( Intravenous Given 06/07/18 2311)  diphenhydrAMINE (BENADRYL) injection 25 mg (25 mg Intravenous Given 06/07/18 2302)  acetaminophen (TYLENOL) tablet 650 mg (650 mg Oral Given 06/07/18 2302)     Initial Impression / Assessment and Plan / ED Course  I  have reviewed the triage vital signs and the nursing notes.  Pertinent labs & imaging results that were available during my care of the patient were reviewed by me and considered in my medical decision making (see chart for details).     Patient presents  brought in by his friends for evaluation of syncopal episode.  He is afebrile, borderline hypotensive in the ED.  Just prior to my assessment, patient apparently had witnessed seizure-like activity.  He had a brief postictal state, no urinary incontinence or intraoral injury.  Did not require Ativan.  No history of seizures.  Lab work significant for new anemia of 6.9.  Has had a baseline of around 8 ever since November 2018 but prior to that his baseline was around 13.  Cardiology recommended follow-up with GI, unclear if patient followed up with them.  Hemoccult negative, however scant amount of stool in the rectal vault.  The small amount that I observed was bright red.  Slightly elevated creatinine of 1.25.  Ammonia is within normal limits.  Ethanol negative.  CT head shows no acute intracranial abnormalities.  There is mild chronic microvascular ischemic changes and parenchymal volume loss of the brain.  Initiated transfusion of 2 units of blood in the ED.  5:51 PM Spoke with Dr. Starla Link with [facility] who recommends consultation to GI and neuro.  5:56 PM Spoke with Dr. Leonel Ramsay, who saw and evaluated the patient.  He suspects convulsive syncope versus provoked seizure from hypotension.  He recommends EEG and MRI but patient does not require transfer to [name]. 7:53 PM Spoke with Dr. Maudie Mercury with [facility] who agrees to assume care patient bring him into the hospital for further evaluation and management. 7:58 PM Spoke with Dr. Michail Sermon with gastroenterology who will follow the patient while he is admitted.  CRITICAL CARE Performed by: Renita Papa   Total critical care time: 40 minutes  Critical care time  was exclusive of separately billable procedures and treating other patients.  Critical care was necessary to treat or prevent imminent or life-threatening deterioration.  Critical care was time spent personally by me on the following activities: development of treatment plan with patient and/or surrogate as well as nursing, discussions with consultants, evaluation of patient's response to treatment, examination of patient, obtaining history from patient or surrogate, ordering and performing treatments and interventions, ordering and review of laboratory studies, ordering and review of radiographic studies, pulse oximetry and re-evaluation of patient's condition.   Final Clinical Impressions(s) / ED Diagnoses   Final diagnoses:  Symptomatic anemia  Syncope, unspecified syncope type    ED Discharge Orders    None       Debroah Baller 06/08/18 Darnelle Catalan, MD 06/08/18 1517

## 2018-06-07 NOTE — Consult Note (Signed)
Neurology Consultation Reason for Consult: Seizure Referring Physician: Amada Kingfisher  CC: Seizure  History is obtained from: Patient  HPI: Gregory [name] is a 57 y.o. male who presents with new onset seizure.  He was riding with friends on the way to get lunch when he became suddenly unresponsive.  He was initially found to be hypotensive on scene but regained consciousness en route.  While in the emergency department he was seen by the nurse to be looking to the left he subsequently stiffened and had what appeared to be tonic-clonic seizure activity which lasted for about a minute.  Following this, he had slow sonorous breathing and was postictally confused gradually coming around.  He is amnestic to that episode.  ROS: A 14 point ROS was performed and is negative except as noted in the HPI.   Past Medical History:  Diagnosis Date  . CAD (coronary artery disease) 12/01/2013  . Chronic combined systolic and diastolic CHF, NYHA class 1 ([place]) 12/01/2013  . Erectile dysfunction 12/01/2013  . HTN (hypertension) 12/01/2013  . Hyperlipidemia 12/01/2013  . Ischemic cardiomyopathy 12/01/2013  . Sleep apnea      Family History  Problem Relation Age of Onset  . Heart disease Mother   . Brain cancer Father   . Hypertension Daughter      Social History:  reports that he quit smoking about 5 years ago. His smoking use included cigarettes. He has a 15.00 pack-year smoking history. He has never used smokeless tobacco. He reports that he does not drink alcohol. His drug history is not on file.   Exam: Current vital signs: BP 109/74   Pulse 62   Temp 98.3 F (36.8 C) (Oral)   Resp 14   Ht 6\' 6"  (1.981 m)   Wt 131.5 kg (290 lb)   SpO2 100%   BMI 33.51 kg/m  Vital signs in last 24 hours: Temp:  [98.3 F (36.8 C)-98.6 F (37 C)] 98.3 F (36.8 C) (06/24 1819) Pulse Rate:  [54-72] 62 (06/24 1900) Resp:  [11-20] 14 (06/24 1900) BP: (94-124)/(56-84) 109/74 (06/24 1900) SpO2:   [95 %-100 %] 100 % (06/24 1900) Weight:  [131.5 kg (290 lb)] 131.5 kg (290 lb) (06/24 1517)   Physical Exam  Constitutional: Appears well-developed and well-nourished.  Psych: Affect appropriate to situation Eyes: No scleral injection HENT: No OP obstrucion Head: Normocephalic.  Cardiovascular: Normal rate and regular rhythm.  Respiratory: Effort normal, non-labored breathing GI: Soft.  No distension. There is no tenderness.  Skin: WDI  Neuro: Mental Status: Patient is awake, alert, oriented to person, place, month, year, and situation. Patient is able to give a clear and coherent history. No signs of aphasia or neglect Cranial Nerves: II: Visual Fields are full. Pupils are equal, round, and reactive to light.   III,IV, VI: EOMI without ptosis or diploplia.  V: Facial sensation is symmetric to temperature VII: Facial movement is symmetric.  VIII: hearing is intact to voice X: Uvula elevates symmetrically XI: Shoulder shrug is symmetric. XII: tongue is midline without atrophy or fasciculations.  Motor: Tone is normal. Bulk is normal. 5/5 strength was present in all four extremities.  Sensory: Sensation is symmetric to light touch and temperature in the arms and legs. Deep Tendon Reflexes: 2+ and symmetric in the biceps and patellae.  Cerebellar: FNF and HKS are intact bilaterally    I have reviewed labs in epic and the results pertinent to this consultation are: Borderline creatinine at 1.25 CBG 122 Hemoglobin 6.9  I have reviewed the images obtained: Head CT- no acute abnormality  Impression: 57 year old male with new onset seizure in the setting of severe hyponatremia and hypotensive episode.  Given that this is his first seizure and it was possibly provoked(hypoperfusion) I would not favor starting antiepileptic therapy at this time.  If you were to have further events, then I think loading with Keppra would be reasonable.  I would do an MRI as well as an EEG and if  either suggest predisposition to seizure activity then therapy could be considered.  Recommendations: 1) MRI brain 2) EEG 3) if the patient were to have another episode would consider loading with Keppra 1 g followed by 500 mg twice daily. 4) for seizure greater than 5 minutes, Ativan 2 mg IV. 5) neurology will continue to follow   Roland Rack, MD [facility] [phone]  If 7pm- 7am, please page neurology on call as listed in [place].

## 2018-06-07 NOTE — H&P (Addendum)
TRH H&P   Patient Demographics:    Gregory [name], is a 57 y.o. male  MRN: [id number]   DOB - December 06, 1961  Admit Date - 06/07/2018  Outpatient Primary MD for the patient is Patient, No Pcp Per  Referring MD/NP/PA: Rodell Perna  Outpatient Specialists:  Dr. Sallyanne Kuster (cardiology)  Patient coming from: home  Chief Complaint  Patient presents with  . Altered Mental Status      HPI:    Gregory [name]  is a 57 y.o. male, w hypertension, hyperlipidemia,  CAD, CHF (60-65%) presents with c/o seizure  " eyes rolled back ", out for about 20 s. Pt states this was witnessed by friends.  Pt was brought to ED for evaluation.   In ED,  CT brain IMPRESSION: 1. No acute intracranial abnormality identified. 2. Mild chronic microvascular ischemic changes and parenchymal volume loss of the brain.  Wbc 9.3, hgb 6.9, Plt 415  Na 136, K 4.4, Bun 21, creatinine 1.25 Ast 20, Alt 19 Folate 10.3 Ferritin 3 Iron 13, TIBC 390, iron sat 3 b12 210 FOBT negative Etoh <10 Ammonia 18  Neurology consulted by ED, GI consulted by ED as well, appreciate input Pt will be admitted for possible seizure and anemia   Review of systems:    In addition to the HPI above,  No Fever-chills, No Headache, No changes with Vision or hearing, No problems swallowing food or Liquids, No Chest pain, Cough or Shortness of Breath, No Abdominal pain, No Nausea or Vommitting, Bowel movements are regular, No Blood in stool or Urine, No dysuria, No new skin rashes or bruises, No new joints pains-aches,  No new weakness, tingling, numbness in any extremity, No recent weight gain or loss, No polyuria, polydypsia or polyphagia, No significant Mental Stressors.  A full 10 point Review of Systems was done, except as stated above, all other Review of Systems were negative.   With Past History of the following :     Past Medical History:  Diagnosis Date  . CAD (coronary artery disease) 12/01/2013  . Chronic combined systolic and diastolic CHF, NYHA class 1 ([place]) 12/01/2013  . Erectile dysfunction 12/01/2013  . HTN (hypertension) 12/01/2013  . Hyperlipidemia 12/01/2013  . Ischemic cardiomyopathy 12/01/2013  . Sleep apnea       Past Surgical History:  Procedure Laterality Date  . ACHILLES TENDON REPAIR     lft foot  . CARDIAC CATHETERIZATION N/A 05/05/2016   Procedure: Left Heart Cath and Coronary Angiography;  Surgeon: Leonie Man, MD;  Location: [facility];  Service: Cardiovascular;  Laterality: N/A;  . CARDIAC DEFIBRILLATOR PLACEMENT    . EP IMPLANTABLE DEVICE N/A 12/19/2016   Procedure: ICD Generator Changeout;  Surgeon: [name], MD;  Location: [facility];  Service: Cardiovascular;  Laterality: N/A;  . WRIST TENODESIS        Social History:  Social History   Tobacco Use  . Smoking status: Former Smoker    Packs/day: 0.50    Years: 30.00    Pack years: 15.00    Types: Cigarettes    Last attempt to quit: 05/31/2013    Years since quitting: 5.0  . Smokeless tobacco: Never Used  Substance Use Topics  . Alcohol use: No    Comment: ocassionally     Lives - at home  Mobility - walks by self   Family History :     Family History  Problem Relation Age of Onset  . Heart disease Mother   . Brain cancer Father   . Hypertension Daughter        Home Medications:   Prior to Admission medications   Medication Sig Start Date End Date Taking? Authorizing Provider  amiodarone (PACERONE) 200 MG tablet Take 1 tablet (200 mg total) by mouth daily. 05/18/18  Yes Croitoru, [name], MD  amLODipine (NORVASC) 10 MG tablet Take 5 mg by mouth daily.   Yes [provider]  aspirin 81 MG tablet Take 81 mg by mouth daily.   Yes [provider]  atorvastatin (LIPITOR) 80 MG tablet TAKE ONE TABLET BY MOUTH DAILY IN THE EVENING 03/12/18  Yes Croitoru,  [name], MD  carvedilol (COREG CR) 80 MG 24 hr capsule Take 1 capsule (80 mg total) by mouth daily. 02/15/18  Yes Croitoru, [name], MD  clopidogrel (PLAVIX) 75 MG tablet TAKE 1 TABLET BY MOUTH EVERY DAY WITH BREAKFAST 10/29/17  Yes Croitoru, [name], MD  hydrochlorothiazide (HYDRODIURIL) 25 MG tablet TAKE 1 TABLET BY MOUTH EVERY DAY 04/19/18  Yes Troy Sine, MD  pantoprazole (PROTONIX) 40 MG tablet Take 1 tablet (40 mg total) daily by mouth. 11/02/17  Yes Kilroy, Luke K, PA-C  potassium chloride (K-DUR,KLOR-CON) 10 MEQ tablet TAKE 1 TABLET BY MOUTH EVERY DAY 05/14/18  Yes Croitoru, [name], MD  sacubitril-valsartan (ENTRESTO) 97-103 MG Take 1 tablet by mouth 2 (two) times daily. 05/07/18  Yes Croitoru, [name], MD  sildenafil (VIAGRA) 50 MG tablet TAKE ONE TABLET BY MOUTH DAILY AS NEEDED FOR ERECTILE DYSFUNCTION Patient taking differently: Take 50 mg by mouth daily as needed for erectile dysfunction.  05/07/18  Yes Croitoru, [name], MD     Allergies:    No Known Allergies   Physical Exam:   Vitals  Blood pressure 94/62, pulse (!) 57, temperature 98.2 F (36.8 C), temperature source Oral, resp. rate 16, height 6\' 6"  (1.981 m), weight 126.2 kg (278 lb 3.5 oz), SpO2 97 %.   1. General  lying in bed in NAD,   2. Normal affect and insight, Not Suicidal or Homicidal, Awake Alert, Oriented X 3.  3. No F.N deficits, ALL C.Nerves Intact, Strength 5/5 all 4 extremities, Sensation intact all 4 extremities, Plantars down going.  4. Ears and Eyes appear Normal, Conjunctivae clear, PERRLA. Moist Oral Mucosa.  5. Supple Neck, No JVD, No cervical lymphadenopathy appriciated, No Carotid Bruits.  6. Symmetrical Chest wall movement, Good air movement bilaterally, CTAB.  7. RRR, No Gallops, Rubs or Murmurs, No Parasternal Heave.  8. Positive Bowel Sounds, Abdomen Soft, No tenderness, No organomegaly appriciated,No rebound -guarding or rigidity.  9.  No Cyanosis, Normal Skin Turgor, No Skin Rash or  Bruise.  10. Good muscle tone,  joints appear normal , no effusions, Normal ROM.  11. No Palpable Lymph Nodes in Neck or Axillae      Data Review:    CBC Recent Labs  Lab 06/07/18  1442  WBC 9.3  HGB 6.9*  HCT 25.4*  PLT 415*  MCV 65.6*  MCH 17.8*  MCHC 27.2*  RDW 19.9*   ------------------------------------------------------------------------------------------------------------------  Chemistries  Recent Labs  Lab 06/07/18 1442  NA 136  K 4.4  CL 103  CO2 23  GLUCOSE 94  BUN 21*  CREATININE 1.25*  CALCIUM 9.0  AST 20  ALT 19  ALKPHOS 61  BILITOT 0.5   ------------------------------------------------------------------------------------------------------------------ estimated creatinine clearance is 94.8 mL/min (A) (by C-G formula based on SCr of 1.25 mg/dL (H)). ------------------------------------------------------------------------------------------------------------------ No results for input(s): TSH, T4TOTAL, T3FREE, THYROIDAB in the last 72 hours.  Invalid input(s): FREET3  Coagulation profile Recent Labs  Lab 06/07/18 1537  INR 1.17   ------------------------------------------------------------------------------------------------------------------- No results for input(s): DDIMER in the last 72 hours. -------------------------------------------------------------------------------------------------------------------  Cardiac Enzymes No results for input(s): CKMB, TROPONINI, MYOGLOBIN in the last 168 hours.  Invalid input(s): CK ------------------------------------------------------------------------------------------------------------------ No results found for: BNP   ---------------------------------------------------------------------------------------------------------------  Urinalysis No results found for: COLORURINE, APPEARANCEUR, LABSPEC, PHURINE, GLUCOSEU, HGBUR, BILIRUBINUR, KETONESUR, PROTEINUR, UROBILINOGEN, NITRITE,  LEUKOCYTESUR  ----------------------------------------------------------------------------------------------------------------   Imaging Results:    Ct Head Wo Contrast  Result Date: 06/07/2018 CLINICAL DATA:  57 y/o  M; Seizure, new, nontraumatic, >40 yrs. EXAM: CT HEAD WITHOUT CONTRAST TECHNIQUE: Contiguous axial images were obtained from the base of the skull through the vertex without intravenous contrast. COMPARISON:  None. FINDINGS: Brain: No evidence of acute infarction, hemorrhage, hydrocephalus, extra-axial collection or mass lesion/mass effect. Few scattered foci of hypoattenuation are present throughout white matter compatible with mild chronic microvascular ischemic changes. Mild brain parenchymal volume loss. The sella turcica. Vascular: Calcific atherosclerosis of the carotid siphons and vertebral arteries. Skull: Normal. Negative for fracture or focal lesion. Sinuses/Orbits: No acute finding. Other: None. IMPRESSION: 1. No acute intracranial abnormality identified. 2. Mild chronic microvascular ischemic changes and parenchymal volume loss of the brain. Electronically Signed   By: Kristine Garbe M.D.   On: 06/07/2018 17:01     Assessment & Plan:    Principal Problem:   Seizure ([place]) Active Problems:   Anemia    Seizure, syncope Tele Trop I q6h x3 Check cardiac echo EEG No MRI due to ICD keppra 1gm iv x 1, then  keppra 500mg  iv bid Neurology consulted by ED, appreciate input  Symptomatic anemia Iron deficiency, b12, deficiency , FOBT negative Check celiac panel Check myeloma panel NPO after MN, appreciate GI input  CAD, CHF (EF 55%) Cont aspirin 81mg  po qday Cont plavix 75mg  po qday Cont carvedilol 80mg  po qday Cont hydrochlorothiazide 25mg  po qday Cont Entresto 1 po bid Cont Lipitor 80mg  po qhs Cont Amiodarone 200mg  po qday STOP amlodipine , pt states he is not taking this medication  Gerd Cont PPI     DVT Prophylaxis  Lovenox - SCDs  AM  Labs Ordered, also please review Full Orders  Family Communication: Admission, patients condition and plan of care including tests being ordered have been discussed with the patient  who indicate understanding and agree with the plan and Code Status.  Code Status FULL CODE  Likely DC to  home  Condition GUARDED   Consults called:  Neurology by ED, GI by ED  Admission status: inpatient  Time spent in minutes : 60   Jani Gravel M.D on 06/07/2018 at 9:54 PM  Between 7am to 7pm - Pager - [phone]   After 7pm go to [email or web address] - password [facility]  [facility] - Office  [phone]

## 2018-06-07 NOTE — ED Triage Notes (Signed)
Patient BIB friends, friend reports they picked him up to go get lunch and patient was in the car with his eyes open however not responding. Reports "then he started talking like he was on a game show." States this occurred approximately twenty minutes ago and resolved once he was in the ED. Patient denies pain, headache, blurred vision, weakness.

## 2018-06-07 NOTE — ED Notes (Signed)
Bed: JW90 Expected date:  Expected time:  Means of arrival:  Comments: T2

## 2018-06-07 NOTE — Progress Notes (Signed)
Consult request has been received. CSW attempting to follow up at present time.  CSW received a request from the pt's daughter [name], via the provider, to contact pt's daughter due to the pt demonstrating AMS before coming to the ED.  CSW called pt's daughter [name] and reached pt's mother who provided the CSW with Anisha's phone number:  [phone].  CSW called the number which is no longer in operation.  CSW standing by should a social work need arise.  Alphonse Guild. Yaelis Scharfenberg, LCSW, LCAS, CSI Clinical Social Worker Ph: [phone]

## 2018-06-07 NOTE — ED Notes (Signed)
Critical value: hgm 6.9

## 2018-06-07 NOTE — ED Notes (Signed)
Call [place] @ [phone]

## 2018-06-07 NOTE — ED Notes (Signed)
PT AWARE OF NEED FOR URINE 

## 2018-06-07 NOTE — ED Notes (Signed)
NEURO MD AT BEDSIDE

## 2018-06-08 ENCOUNTER — Inpatient Hospital Stay (HOSPITAL_COMMUNITY): Payer: Worker's Compensation

## 2018-06-08 ENCOUNTER — Inpatient Hospital Stay (HOSPITAL_COMMUNITY)
Admit: 2018-06-08 | Discharge: 2018-06-08 | Disposition: A | Discharge status: Home / Self Care | Payer: PRIVATE HEALTH INSURANCE | Attending: Internal Medicine | Admitting: Internal Medicine

## 2018-06-08 DIAGNOSIS — G40909 Epilepsy, unspecified, not intractable, without status epilepticus: Secondary | ICD-10-CM

## 2018-06-08 DIAGNOSIS — D649 Anemia, unspecified: Secondary | ICD-10-CM

## 2018-06-08 DIAGNOSIS — D509 Iron deficiency anemia, unspecified: Secondary | ICD-10-CM

## 2018-06-08 DIAGNOSIS — I5030 Unspecified diastolic (congestive) heart failure: Secondary | ICD-10-CM | POA: Diagnosis not present

## 2018-06-08 DIAGNOSIS — R569 Unspecified convulsions: Secondary | ICD-10-CM | POA: Diagnosis not present

## 2018-06-08 DIAGNOSIS — I5032 Chronic diastolic (congestive) heart failure: Secondary | ICD-10-CM

## 2018-06-08 DIAGNOSIS — I952 Hypotension due to drugs: Secondary | ICD-10-CM

## 2018-06-08 LAB — URINALYSIS, ROUTINE W REFLEX MICROSCOPIC
Bilirubin Urine: NEGATIVE
Glucose, UA: NEGATIVE mg/dL
Hgb urine dipstick: NEGATIVE
Ketones, ur: NEGATIVE mg/dL
Leukocytes, UA: NEGATIVE
Nitrite: NEGATIVE
Protein, ur: NEGATIVE mg/dL
Specific Gravity, Urine: 1.015 (ref 1.005–1.030)
pH: 7 (ref 5.0–8.0)

## 2018-06-08 LAB — COMPREHENSIVE METABOLIC PANEL
ALBUMIN: 3.3 g/dL — AB (ref 3.5–5.0)
ALT: 16 U/L (ref 0–44)
ANION GAP: 7 (ref 5–15)
AST: 14 U/L — ABNORMAL LOW (ref 15–41)
Alkaline Phosphatase: 53 U/L (ref 38–126)
BUN: 23 mg/dL — ABNORMAL HIGH (ref 6–20)
CHLORIDE: 102 mmol/L (ref 98–111)
CO2: 26 mmol/L (ref 22–32)
Calcium: 8.5 mg/dL — ABNORMAL LOW (ref 8.9–10.3)
Creatinine, Ser: 1.27 mg/dL — ABNORMAL HIGH (ref 0.61–1.24)
GFR calc non Af Amer: >60 mL/min (ref >60)
GLUCOSE: 115 mg/dL — AB (ref 70–99)
POTASSIUM: 3.4 mmol/L — AB (ref 3.5–5.1)
SODIUM: 135 mmol/L (ref 135–145)
Total Bilirubin: 1.9 mg/dL — ABNORMAL HIGH (ref 0.3–1.2)
Total Protein: 6.5 g/dL (ref 6.5–8.1)

## 2018-06-08 LAB — TYPE AND SCREEN
ABO/RH(D): B POS
Antibody Screen: NEGATIVE
Unit division: 0
Unit division: 0

## 2018-06-08 LAB — TROPONIN I
Troponin I: <0.03 ng/mL (ref <0.03)
Troponin I: <0.03 ng/mL (ref <0.03)

## 2018-06-08 LAB — TSH: TSH: 1.408 u[IU]/mL (ref 0.350–4.500)

## 2018-06-08 LAB — CBC
HEMATOCRIT: 26.6 % — AB (ref 39.0–52.0)
Hemoglobin: 7.5 g/dL — ABNORMAL LOW (ref 13.0–17.0)
MCH: 19 pg — AB (ref 26.0–34.0)
MCHC: 28.2 g/dL — AB (ref 30.0–36.0)
MCV: 67.5 fL — AB (ref 78.0–100.0)
Platelets: 347 10*3/uL (ref 150–400)
RBC: 3.94 MIL/uL — AB (ref 4.22–5.81)
RDW: 20.8 % — ABNORMAL HIGH (ref 11.5–15.5)
WBC: 8 10*3/uL (ref 4.0–10.5)

## 2018-06-08 LAB — RAPID URINE DRUG SCREEN, HOSP PERFORMED
Amphetamines: NOT DETECTED
Benzodiazepines: NOT DETECTED
Cocaine: NOT DETECTED
Opiates: NOT DETECTED
Tetrahydrocannabinol: POSITIVE — AB

## 2018-06-08 LAB — BPAM RBC
Blood Product Expiration Date: 201906292359
Blood Product Expiration Date: 201907012359
ISSUE DATE / TIME: 201906241744
ISSUE DATE / TIME: 201906242104
Unit Type and Rh: 1700
Unit Type and Rh: 1700

## 2018-06-08 LAB — ECHOCARDIOGRAM COMPLETE
Height: 78 in
WEIGHTICAEL: 4451.53 [oz_av]

## 2018-06-08 MED ORDER — SODIUM CHLORIDE 0.9 % IV SOLN
510.0000 mg | Freq: Once | INTRAVENOUS | Status: AC
  Administered 2018-06-08: 510 mg via INTRAVENOUS
  Filled 2018-06-08: qty 17

## 2018-06-08 MED ORDER — POTASSIUM CHLORIDE CRYS ER 20 MEQ PO TBCR
20.0000 meq | EXTENDED_RELEASE_TABLET | Freq: Once | ORAL | Status: AC
  Administered 2018-06-08: 20 meq via ORAL
  Filled 2018-06-08: qty 1

## 2018-06-08 MED ORDER — ZOLPIDEM TARTRATE 5 MG PO TABS
5.0000 mg | ORAL_TABLET | Freq: Every evening | ORAL | Status: DC | PRN
  Administered 2018-06-08: 5 mg via ORAL
  Filled 2018-06-08: qty 1

## 2018-06-08 MED ORDER — SODIUM CHLORIDE 0.9 % IV SOLN
INTRAVENOUS | Status: DC
  Administered 2018-06-08: 11:00:00 via INTRAVENOUS

## 2018-06-08 NOTE — Progress Notes (Signed)
  Echocardiogram 2D Echocardiogram has been performed.  Randa Lynn Amamda Curbow 06/08/2018, 9:33 AM

## 2018-06-08 NOTE — Progress Notes (Signed)
Patient is requesting a sleep aid.  PCP was notified. 

## 2018-06-08 NOTE — Progress Notes (Signed)
PROGRESS NOTE  Gregory [name] OIZ:[id number] DOB: 05-Jan-1961 DOA: 06/07/2018 PCP: Patient, No Pcp Per   LOS: 1 day   Brief Narrative / Interim history: 57 year old male with a medical history significant for iron deficiency anemia, HTN, CHF, HLD, CAD, ischemic cardiomyopathy, cardiac cath in May 2017, and ICD in January 2018. Presented to the ED yesterday after a an unresponsive episode in the car on the way to lunch. Friends reported his eyes rolled in the back of his head and this lasted for about 10 minutes. EMS was called and he was found to be hypotensive on scene, but recovered on the way to the ED. While in the ED he had a similar episode of unresponsiveness for about 20 seconds and then generalized tonic-clonic seizure-like activity. CT of the head showed mild chronic microvascular ischemic changes and parenchymal volume loss of the brain, with no acte intra cranial abnormality, Na 136, K 4.4, BUN 20, Cr 1.25, Hgb 6.9, Iron 13, TIBC 390, saturation ratio 3%, Ferritin 3, FOBT negative, no signs of active bleed. He was admitted with working diagnosis of symptomatic anemia and seizure in the setting of severe hyponatremia and hypotensive episode.  Neurology was consulted, patient given 1g IV bolus Keppra followed by 500 mg BID. He was transfused with 2U PRBC overnight.  Assessment & Plan: Principal Problem:   Seizure [facility]) Active Problems:   Anemia  Seizure -Two reported seizures within the last 24 hours one of which was in the ED -Neurology following. Consulted in the ED, gave 1g IV Keppra followed by 500 mg BID  -CT scan showed mild chronic microvascular ischemic changes and parenchymal volume loss of the brain, with no acte intra cranial abnormality. -EEG ordered, MRI cannot be done because the patient has an ICD -Urine drug screen ordered, not yet complete -Neuro recommends outpatient follow up.  Symptomatic iron deficiency anemia -Patient was found to be hypotensive at  the scene when EMS arrived. He reports multiple episodes of hypotension in the past month causing his cardiologist to d/c his amlodipine and decrease amiodarone dose. -Hgb was 6.9 on arrival to ED, FOBT negative, Iron 13, TIBC 390, saturation ratio 3%, Ferritin 3, no signs of active bleed. -History of chronic anemia back to November 2018 -Transfused with 2U blood over night, Hgb 7.5 this am. -GI consulted, recommend outpatient EGD and colonoscopy, full liquid diet today -Feraheme 510mg  IV ordered -Recheck CBC in AM  Hypokalemia -Replete with 30 mEq KCl, recheck BMP in AM  Hypertension -Patient was hypotensive overnight, BP 97/65 this AM -On Entresto, HCTZ, amiodarone -Hold all home BP meds.  CAD/HLD -Continue rosuvastatin, plavix, ASA  Combined diastolic/systolic CHF -Last echo EF 60-65% in May 2017, Echo complete today, results pending -No signs of exacerbation -Hold Carvedilol and Lasix due to hypotension  DVT prophylaxis: Plavix, ASA, SCDs Code Status: Full code. Family Communication: No family members at bedside. Disposition Plan: Monitor Hgb and seizure status today, if stable possible discharge tomorrow.  Consultants:   Neurology  GI  Procedures:   2D echo: 06/08/18, results pending  EEG: 06/08/18, results pending  Subjective: Patient is resting in bed upon arrival. He reports that he is feeling fine this morning. He does not recall the seizure episodes yesterday. He denies additional seizures overnight. Denies headaches, dizziness, chest pain, SOB, nausea, vomiting, hematemesis, blood in stool. He does express frustration with having to stay in the hospital an additional day.  Objective: Vitals:   06/08/18 0022 06/08/18 0500 06/08/18 0536 06/08/18  0538  BP: 103/62 (!) 183/93 (!) 87/64 97/65  Pulse: 61 (!) 106 69 67  Resp: 18 18 20    Temp: 98.4 F (36.9 C) 98.6 F (37 C) 98 F (36.7 C)   TempSrc: Oral Oral Oral   SpO2: 98% 97% 99% 98%  Weight:      Height:         Intake/Output Summary (Last 24 hours) at 06/08/2018 1101 Last data filed at 06/08/2018 0701 Gross per 24 hour  Intake 1957.08 ml  Output 1550 ml  Net 407.08 ml   Filed Weights   06/07/18 1517 06/07/18 2100  Weight: 131.5 kg (290 lb) 126.2 kg (278 lb 3.5 oz)    Examination:  Constitutional: well developed, well nourished, NAD Eyes: PERRL, EOMI, nodules lateral aspect of bilateral eyes (L 1 cm in diameter, R 0.5 cm diameter), conjunctivae normal ENMT: Mucous membranes are moist. No oropharyngeal exudates Neck: normal, supple. Respiratory: clear to auscultation bilaterally, no wheezing, no crackles. Normal respiratory effort. No accessory muscle use.  Cardiovascular: Regular rate and rhythm, no appreciable murmurs / rubs / gallops. No LE edema.  Abdomen: Soft, no distension, no TTP. Musculoskeletal: No joint deformity upper and lower extremities. No contractures. Normal muscle tone.  Neurologic: CN 2-12 grossly intact. Strength 5/5 in all 4. Sensation intact. Psychiatric: Normal judgment and insight. Normal mood.    Data Reviewed: I have independently reviewed following labs and imaging studies  CBC: Recent Labs  Lab 06/07/18 1442 06/08/18 0154  WBC 9.3 8.0  HGB 6.9* 7.5*  HCT 25.4* 26.6*  MCV 65.6* 67.5*  PLT 415* 536   Basic Metabolic Panel: Recent Labs  Lab 06/07/18 1442 06/08/18 0154  NA 136 135  K 4.4 3.4*  CL 103 102  CO2 23 26  GLUCOSE 94 115*  BUN 21* 23*  CREATININE 1.25* 1.27*  CALCIUM 9.0 8.5*   GFR: Estimated Creatinine Clearance: 93.3 mL/min (A) (by C-G formula based on SCr of 1.27 mg/dL (H)). Liver Function Tests: Recent Labs  Lab 06/07/18 1442 06/08/18 0154  AST 20 14*  ALT 19 16  ALKPHOS 61 53  BILITOT 0.5 1.9*  PROT 7.2 6.5  ALBUMIN 3.6 3.3*   No results for input(s): LIPASE, AMYLASE in the last 168 hours. Recent Labs  Lab 06/07/18 1519  AMMONIA 18   Coagulation Profile: Recent Labs  Lab 06/07/18 1537  INR 1.17    Cardiac Enzymes: Recent Labs  Lab 06/08/18 0154 06/08/18 0804  TROPONINI <0.03 <0.03   BNP (last 3 results) No results for input(s): PROBNP in the last 8760 hours. HbA1C: No results for input(s): HGBA1C in the last 72 hours. CBG: Recent Labs  Lab 06/07/18 1344 06/07/18 1523  GLUCAP 95 122*   Lipid Profile: No results for input(s): CHOL, HDL, LDLCALC, TRIG, CHOLHDL, LDLDIRECT in the last 72 hours. Thyroid Function Tests: Recent Labs    06/08/18 0154  TSH 1.408   Anemia Panel: Recent Labs    06/07/18 1442 06/07/18 1746  VITAMINB12  --  210  FOLATE 10.3  --   FERRITIN  --  3*  TIBC  --  390  IRON  --  13*  RETICCTPCT 1.7  --    Urine analysis: No results found for: COLORURINE, APPEARANCEUR, LABSPEC, PHURINE, GLUCOSEU, HGBUR, BILIRUBINUR, KETONESUR, PROTEINUR, UROBILINOGEN, NITRITE, LEUKOCYTESUR Sepsis Labs: Invalid input(s): PROCALCITONIN, LACTICIDVEN  No results found for this or any previous visit (from the past 240 hour(s)).    Radiology Studies: Ct Head Wo Contrast  Result Date:  06/07/2018 CLINICAL DATA:  57 y/o  M; Seizure, new, nontraumatic, >40 yrs. EXAM: CT HEAD WITHOUT CONTRAST TECHNIQUE: Contiguous axial images were obtained from the base of the skull through the vertex without intravenous contrast. COMPARISON:  None. FINDINGS: Brain: No evidence of acute infarction, hemorrhage, hydrocephalus, extra-axial collection or mass lesion/mass effect. Few scattered foci of hypoattenuation are present throughout white matter compatible with mild chronic microvascular ischemic changes. Mild brain parenchymal volume loss. The sella turcica. Vascular: Calcific atherosclerosis of the carotid siphons and vertebral arteries. Skull: Normal. Negative for fracture or focal lesion. Sinuses/Orbits: No acute finding. Other: None. IMPRESSION: 1. No acute intracranial abnormality identified. 2. Mild chronic microvascular ischemic changes and parenchymal volume loss of the brain.  Electronically Signed   By: Kristine Garbe M.D.   On: 06/07/2018 17:01     Scheduled Meds: . amiodarone  200 mg Oral Daily  . aspirin EC  81 mg Oral Daily  . atorvastatin  80 mg Oral QPM  . clopidogrel  75 mg Oral Q breakfast  . pantoprazole  40 mg Oral Daily  . potassium chloride  10 mEq Oral Daily  . potassium chloride  20 mEq Oral Once   Continuous Infusions: . ferumoxytol      Collier Salina, PA-S [facility] Pager [phone]  If 7PM-7AM, please contact night-coverage [email or web address] Password [email or web address] 06/08/2018, 11:01 AM

## 2018-06-08 NOTE — Consult Note (Addendum)
Referring Provider:  ER Primary Care Physician:  Patient, No Pcp Per Primary Gastroenterologist:  unassigned  Reason for Consultation:  Anemia, occult blood negative  HPI: Gregory [name] is a 57 y.o. male with past medical history of coronary artery disease currently on Plavix, hypertension and hyperlipidemia, history of CHF presented to the hospital with altered mental status and possible seizure. He was found to have iron deficiency anemia with hemoglobin of 6.9. FOBT negative. GI is consulted for further evaluation.  Patient seen and examined at bedside.  He is denying any GI symptoms.  Denies any black stool or bright red blood per rectum.  Denies nausea vomiting.  Denies acid reflux, dysphagia or odynophagia.  Denies abdominal pain, diarrhea or constipation.  Denies any change in bowel habits.  No previous EGD or colonoscopy.  No family history of colon cancer.  No significant NSAID use.  Past Medical History:  Diagnosis Date  . CAD (coronary artery disease) 12/01/2013  . Chronic combined systolic and diastolic CHF, NYHA class 1 ([place]) 12/01/2013  . Erectile dysfunction 12/01/2013  . HTN (hypertension) 12/01/2013  . Hyperlipidemia 12/01/2013  . Ischemic cardiomyopathy 12/01/2013  . Sleep apnea     Past Surgical History:  Procedure Laterality Date  . ACHILLES TENDON REPAIR     lft foot  . CARDIAC CATHETERIZATION N/A 05/05/2016   Procedure: Left Heart Cath and Coronary Angiography;  Surgeon: Leonie Man, MD;  Location: [facility];  Service: Cardiovascular;  Laterality: N/A;  . CARDIAC DEFIBRILLATOR PLACEMENT    . EP IMPLANTABLE DEVICE N/A 12/19/2016   Procedure: ICD Generator Changeout;  Surgeon: [name], MD;  Location: [facility];  Service: Cardiovascular;  Laterality: N/A;  . WRIST TENODESIS      Prior to Admission medications   Medication Sig Start Date End Date Taking? Authorizing Provider  amiodarone (PACERONE) 200 MG tablet Take 1 tablet  (200 mg total) by mouth daily. 05/18/18  Yes Croitoru, [name], MD  amLODipine (NORVASC) 10 MG tablet Take 5 mg by mouth daily.   Yes [provider]  aspirin 81 MG tablet Take 81 mg by mouth daily.   Yes [provider]  atorvastatin (LIPITOR) 80 MG tablet TAKE ONE TABLET BY MOUTH DAILY IN THE EVENING 03/12/18  Yes Croitoru, [name], MD  carvedilol (COREG CR) 80 MG 24 hr capsule Take 1 capsule (80 mg total) by mouth daily. 02/15/18  Yes Croitoru, [name], MD  clopidogrel (PLAVIX) 75 MG tablet TAKE 1 TABLET BY MOUTH EVERY DAY WITH BREAKFAST 10/29/17  Yes Croitoru, [name], MD  hydrochlorothiazide (HYDRODIURIL) 25 MG tablet TAKE 1 TABLET BY MOUTH EVERY DAY 04/19/18  Yes Troy Sine, MD  pantoprazole (PROTONIX) 40 MG tablet Take 1 tablet (40 mg total) daily by mouth. 11/02/17  Yes Kilroy, Luke K, PA-C  potassium chloride (K-DUR,KLOR-CON) 10 MEQ tablet TAKE 1 TABLET BY MOUTH EVERY DAY 05/14/18  Yes Croitoru, [name], MD  sacubitril-valsartan (ENTRESTO) 97-103 MG Take 1 tablet by mouth 2 (two) times daily. 05/07/18  Yes Croitoru, [name], MD  sildenafil (VIAGRA) 50 MG tablet TAKE ONE TABLET BY MOUTH DAILY AS NEEDED FOR ERECTILE DYSFUNCTION Patient taking differently: Take 50 mg by mouth daily as needed for erectile dysfunction.  05/07/18  Yes Croitoru, [name], MD    Scheduled Meds: . amiodarone  200 mg Oral Daily  . aspirin EC  81 mg Oral Daily  . atorvastatin  80 mg Oral QPM  . carvedilol  80 mg Oral Daily  .  clopidogrel  75 mg Oral Q breakfast  . hydrochlorothiazide  25 mg Oral Daily  . pantoprazole  40 mg Oral Daily  . potassium chloride  10 mEq Oral Daily  . sacubitril-valsartan  1 tablet Oral BID   Continuous Infusions: . levETIRAcetam     PRN Meds:.acetaminophen **OR** acetaminophen  Allergies as of 06/07/2018  . (No Known Allergies)    Family History  Problem Relation Age of Onset  . Heart disease Mother   . Brain cancer Father   . Hypertension Daughter     Social History    Socioeconomic History  . Marital status: Single    Spouse name: Not on file  . Number of children: Not on file  . Years of education: Not on file  . Highest education level: Not on file  Occupational History  . Not on file  Social Needs  . Financial resource strain: Not on file  . Food insecurity:    Worry: Not on file    Inability: Not on file  . Transportation needs:    Medical: Not on file    Non-medical: Not on file  Tobacco Use  . Smoking status: Former Smoker    Packs/day: 0.50    Years: 30.00    Pack years: 15.00    Types: Cigarettes    Last attempt to quit: 05/31/2013    Years since quitting: 5.0  . Smokeless tobacco: Never Used  Substance and Sexual Activity  . Alcohol use: No    Comment: ocassionally  . Drug use: Not on file  . Sexual activity: Not on file  Lifestyle  . Physical activity:    Days per week: Not on file    Minutes per session: Not on file  . Stress: Not on file  Relationships  . Social connections:    Talks on phone: Not on file    Gets together: Not on file    Attends religious service: Not on file    Active member of club or organization: Not on file    Attends meetings of clubs or organizations: Not on file    Relationship status: Not on file  . Intimate partner violence:    Fear of current or ex partner: Not on file    Emotionally abused: Not on file    Physically abused: Not on file    Forced sexual activity: Not on file  Other Topics Concern  . Not on file  Social History Narrative  . Not on file    Review of Systems: Review of Systems  Constitutional: Negative for chills and fever.  HENT: Negative for hearing loss and tinnitus.   Eyes: Negative for blurred vision and double vision.  Respiratory: Negative for cough, hemoptysis and sputum production.   Cardiovascular: Negative for chest pain and palpitations.  Gastrointestinal: Negative for abdominal pain, blood in stool, constipation, diarrhea, heartburn, melena, nausea and  vomiting.  Genitourinary: Negative for dysuria and urgency.  Musculoskeletal: Negative for myalgias and neck pain.  Skin: Negative for itching and rash.  Neurological: Positive for dizziness and seizures.  Endo/Heme/Allergies: Does not bruise/bleed easily.  Psychiatric/Behavioral: Negative for hallucinations and suicidal ideas.    Physical Exam: Vital signs: Vitals:   06/08/18 0536 06/08/18 0538  BP: (!) 87/64 97/65  Pulse: 69 67  Resp: 20   Temp: 98 F (36.7 C)   SpO2: 99% 98%   Last BM Date: 06/08/18  Physical Exam  Constitutional: He is oriented to person, place, and time. He appears well-developed  and well-nourished.  HENT:  Mouth/Throat: Oropharynx is clear and moist. No oropharyngeal exudate.  Eyes: EOM are normal. No scleral icterus.  Neck: Normal range of motion. Neck supple. No thyromegaly present.  Cardiovascular: Normal rate, regular rhythm and normal heart sounds.  Pulmonary/Chest: Effort normal and breath sounds normal. No respiratory distress.  Abdominal: Soft. Bowel sounds are normal. He exhibits no distension. There is no tenderness. There is no rebound and no guarding.  Musculoskeletal: Normal range of motion. He exhibits no edema.  Neurological: He is alert and oriented to person, place, and time.  Skin: Skin is warm. No erythema.  Psychiatric: He has a normal mood and affect. Thought content normal.  Vitals reviewed.   GI:  Lab Results: Recent Labs    06/07/18 1442 06/08/18 0154  WBC 9.3 8.0  HGB 6.9* 7.5*  HCT 25.4* 26.6*  PLT 415* 347   BMET Recent Labs    06/07/18 1442 06/08/18 0154  NA 136 135  K 4.4 3.4*  CL 103 102  CO2 23 26  GLUCOSE 94 115*  BUN 21* 23*  CREATININE 1.25* 1.27*  CALCIUM 9.0 8.5*   LFT Recent Labs    06/08/18 0154  PROT 6.5  ALBUMIN 3.3*  AST 14*  ALT 16  ALKPHOS 53  BILITOT 1.9*   PT/INR Recent Labs    06/07/18 1537  LABPROT 14.8  INR 1.17     Studies/Results: Ct Head Wo Contrast  Result  Date: 06/07/2018 CLINICAL DATA:  57 y/o  M; Seizure, new, nontraumatic, >40 yrs. EXAM: CT HEAD WITHOUT CONTRAST TECHNIQUE: Contiguous axial images were obtained from the base of the skull through the vertex without intravenous contrast. COMPARISON:  None. FINDINGS: Brain: No evidence of acute infarction, hemorrhage, hydrocephalus, extra-axial collection or mass lesion/mass effect. Few scattered foci of hypoattenuation are present throughout white matter compatible with mild chronic microvascular ischemic changes. Mild brain parenchymal volume loss. The sella turcica. Vascular: Calcific atherosclerosis of the carotid siphons and vertebral arteries. Skull: Normal. Negative for fracture or focal lesion. Sinuses/Orbits: No acute finding. Other: None. IMPRESSION: 1. No acute intracranial abnormality identified. 2. Mild chronic microvascular ischemic changes and parenchymal volume loss of the brain. Electronically Signed   By: Kristine Garbe M.D.   On: 06/07/2018 17:01    Impression/Plan: -Iron deficiency anemia with hemoglobin of 6.9 on admission.  FOBT negative.  He denies any overt black-colored stool or bright red blood per rectum.  No GI symptoms.   -Chronic anemia dated back to November 2018 when his hemoglobin was around 8.9. -Seizure disorder. -History of coronary artery disease.  Currently on Plavix.  Recommendations ------------------------ -He has chronic anemia dated back to November 2018.  FOBT negative.  He denies any overt black-colored stool or bright red blood per rectum.  No GI symptoms.   -Follow neurology work-up. -Okay to have full liquid diet today. -If hemoglobin remains stable, recommend outpatient EGD and colonoscopy after holding Plavix for 5 days. -Recheck blood work in the morning. -GI will follow   LOS: 1 day   Otis Brace  MD, FACP 06/08/2018, 9:04 AM  Contact #  [phone]

## 2018-06-08 NOTE — Progress Notes (Signed)
EEG completed; results pending.    

## 2018-06-08 NOTE — Progress Notes (Addendum)
Subjective: No further seizures overnight.  Patient is feeling comfortable.  No complaints.  Currently getting EEG.  Exam: Vitals:   06/08/18 0536 06/08/18 0538  BP: (!) 87/64 97/65  Pulse: 69 67  Resp: 20   Temp: 98 F (36.7 C)   SpO2: 99% 98%    Physical Exam   HEENT-  Normocephalic, no lesions, without obvious abnormality.  Normal external eye and conjunctiva.   Extremities- Warm, dry and intact Musculoskeletal-no joint tenderness, deformity or swelling Skin-warm and dry, no hyperpigmentation, vitiligo, or suspicious lesions    Neuro:  Mental Status: Alert, oriented, thought content appropriate.  Speech fluent without evidence of aphasia.  Able to follow 3 step commands without difficulty. Cranial Nerves: II: Visual fields grossly normal,  III,IV, VI: ptosis not present, extra-ocular motions intact bilaterally pupils equal, round, reactive to light and accommodation V,VII: smile symmetric, facial light touch sensation normal bilaterally VIII: hearing normal bilaterally IX,X: uvula rises symmetrically XI: bilateral shoulder shrug XII: midline tongue extension Motor: Right : Upper extremity   5/5    Left:     Upper extremity   5/5  Lower extremity   5/5     Lower extremity   5/5 Tone and bulk:normal tone throughout; no atrophy noted Sensory: Pinprick and light touch intact throughout, bilaterally Deep Tendon Reflexes: 2+ and symmetric throughout upper extremities and patella. Plantars: Right: downgoing   Left: downgoing     Medications:  Scheduled: . amiodarone  200 mg Oral Daily  . aspirin EC  81 mg Oral Daily  . atorvastatin  80 mg Oral QPM  . carvedilol  80 mg Oral Daily  . clopidogrel  75 mg Oral Q breakfast  . hydrochlorothiazide  25 mg Oral Daily  . pantoprazole  40 mg Oral Daily  . potassium chloride  10 mEq Oral Daily  . sacubitril-valsartan  1 tablet Oral BID    Pertinent Labs/Diagnostics: Potassium 3.4 Glucose 115 BUN 23 Creatinine  1.27 Calcium 8.5 Hemoglobin 7.5 Hematocrit 26.6 EEG findings pending  Ct Head Wo Contrast  Result Date: 06/07/2018 CLINICAL DATA:  57 y/o  M; Seizure, new, nontraumatic, >40 yrs. EXAM: CT HEAD WITHOUT CONTRAST TECHNIQUE: Contiguous axial images were obtained from the base of the skull through the vertex without intravenous contrast. COMPARISON:  None. FINDINGS: Brain: No evidence of acute infarction, hemorrhage, hydrocephalus, extra-axial collection or mass lesion/mass effect. Few scattered foci of hypoattenuation are present throughout white matter compatible with mild chronic microvascular ischemic changes. Mild brain parenchymal volume loss. The sella turcica. Vascular: Calcific atherosclerosis of the carotid siphons and vertebral arteries. Skull: Normal. Negative for fracture or focal lesion. Sinuses/Orbits: No acute finding. Other: None. IMPRESSION: 1. No acute intracranial abnormality identified. 2. Mild chronic microvascular ischemic changes and parenchymal volume loss of the brain. Electronically Signed   By: Kristine Garbe M.D.   On: 06/07/2018 17:01        Assessment: 57 year old male with new onset seizure in the setting of severe hyponatremia and hypotensive episode.  Given findings and first seizure it was possible provoked.  Patient did receive a bolus of Keppra however at this point time patient has stated he would not like to stay on Keppra and take the risk of possibly having another seizure if it happens.  It was explained to him that he has a 50 percent chance of having a further seizure and again he still decided to stay off a AED.  Patient is unable to get MRI secondary to pacemaker.  Recommendations: -Follow-up with neurology as an outpatient -Per [facility] statutes, patients with seizures are not allowed to drive until  they have been seizure-free for six months. Use caution when using heavy equipment or power tools. Avoid working on ladders or at  heights. Take showers instead of baths. Ensure the water temperature is not too high on the home water heater. Do not go swimming alone. When caring for infants or small children, sit down when holding, feeding, or changing them to minimize risk of injury to the child in the event you have a seizure.     Etta Quill PA-C [facility] [phone]  06/08/2018, 9:25 AM

## 2018-06-08 NOTE — Procedures (Signed)
HPI: 57 year old with new onset seizure  TECHNICAL SUMMARY:  A multichannel referential and bipolar montage EEG using the standard international 10-20 system was performed on the patient described as awake and drowsy.  The dominant background activity consists of 10 hertz activity seen most prominantly over the posterior head region.  The backgound activity is reactive to eye opening and closing procedures.  Low voltage fast (beta) activity is distributed symmetrically and maximally over the anterior head regions.  ACTIVATION:  Stepwise photic stimulation was not performed.  Hyperventilation was performed for 3 minutes with good patient effort and produced no changes in the background activity.  EPILEPTIFORM ACTIVITY:  There were no spikes, sharp waves or paroxysmal activity.  SLEEP: Physiologic drowsiness is noted, but no stage II sleep.  CARDIAC:  The EKG lead revealed a regular sinus rhythm.  IMPRESSION:  This is a normal EEG for the patients stated age.  There were no focal, hemispheric or lateralizing features.  No epileptiform activity was recorded.  A normal EEG does not exclude the diagnosis of a seizure disorder and if seizure remains high on the list of differential diagnosis, an ambulatory EEG may be of value.  Clinical correlation is required.

## 2018-06-08 NOTE — Progress Notes (Signed)
PROGRESS NOTE    Gregory [name]  RCV:[id number] DOB: May 02, 1961 DOA: 06/07/2018 PCP: Patient, No Pcp Per    Brief Narrative:  57 year old male who presented with altered mental status.  He does have significant past medical history for hypertension, dyslipidemia, coronary artery disease and congestive heart failure.  Patient was witnessed to have altered mentation for about 20 seconds, where his eyes rolled back and went unresponsive.  In the emergency department he was witnessed to have a tonic-clonic seizure.  On his initial physical examination blood pressure 94/62, heart rate 57, temperature 98.2, respiratory rate 16, oxygen saturation 97%.  His lungs are clear to auscultation, heart S1-S2 present rhythmic, the abdomen was soft nontender, no extremity edema, neurologically patient was nonfocal.  Sodium 136, potassium 4.4, chloride 103, bicarb 23, glucose 94, BUN 21, creatinine 1.25, AST 20, ALT 19, white count 9.3, hemoglobin 6.9, hematocrit 25.4, platelets 417.  His urine analysis specific gravity 1.015, negative for infection.  Drug screen positive for tetrahydrocannabinol.  Head CT with no acute changes.  EKG with atrial pacing, left axis deviation.  Patient was admitted to the hospital working diagnosis of generalized seizure, complicated by hypotension and anemia.  Assessment & Plan:   Principal Problem:   Seizure disorder ([place]) Active Problems:   Anemia   1. Seizure. Seems to be provoked due to anemia, hypotension and electrolyte disturbances. Will continue neuro checks per unit protocol, will avoid further hypotension, patient is sp prbc transfusion. EEG with no active seizures, no further antiepileptic medications recommended per neurology.  2. Symptomatic Iron deficiency anemia. Iron panel with profound iron deficiency, with serum iron of 13, tibc 390, transferrin saturation 3 and ferritin of 3. Will order IV iron. Follow with colonoscopy with outpatient GI clinic. HB up  to 7.5 after 2 units prbc transfusion.   3. Heart failure (diastolic) with no exacerbation, complicated with hypotension. Old records personally reviewed and his lV function has been improving, will continue telemetry monitoring. For now will hold on all antihypertensive agents and will continue blood pressure monitoring. Continue amiodarone.   4. Hypokalemia and hyponatremia. Will continue k correction with kcl, follow on renal panel in am, avoid hypotension, hold on diuretics for now.   5. CAD. No chest pain, will continue statin and dual antiplatelet therapy with asa and clopidogrel.    DVT prophylaxis:scd  Code Status:  full Family Communication: no family at the bedside  Disposition Plan: home in am if blood pressure and hb stable    Consultants:   Neurology   GI   Procedures:     Antimicrobials:       Subjective: Patient is feeling better, no further seizure events, no chest pain no dyspnea, no nausea or vomiting.   Objective: Vitals:   06/08/18 0022 06/08/18 0500 06/08/18 0536 06/08/18 0538  BP: 103/62 (!) 183/93 (!) 87/64 97/65  Pulse: 61 (!) 106 69 67  Resp: 18 18 20    Temp: 98.4 F (36.9 C) 98.6 F (37 C) 98 F (36.7 C)   TempSrc: Oral Oral Oral   SpO2: 98% 97% 99% 98%  Weight:      Height:        Intake/Output Summary (Last 24 hours) at 06/08/2018 1621 Last data filed at 06/08/2018 1129 Gross per 24 hour  Intake 2075.08 ml  Output 1550 ml  Net 525.08 ml   Filed Weights   06/07/18 1517 06/07/18 2100  Weight: 131.5 kg (290 lb) 126.2 kg (278 lb 3.5 oz)  Examination:   General: Not in pain or dyspnea, deconditioned  Neurology: Awake and alert, non focal  E CNO:[id number] pallor, no icterus, oral mucosa moist Cardiovascular: No JVD. S1-S2 present, rhythmic, no gallops, rubs, or murmurs. No lower extremity edema. Pulmonary: vesicular breath sounds bilaterally, adequate air movement, no wheezing, rhonchi or rales. Gastrointestinal. Abdomen flat,  no organomegaly, non tender, no rebound or guarding Skin. No rashes Musculoskeletal: no joint deformities     Data Reviewed: I have personally reviewed following labs and imaging studies  CBC: Recent Labs  Lab 06/07/18 1442 06/08/18 0154  WBC 9.3 8.0  HGB 6.9* 7.5*  HCT 25.4* 26.6*  MCV 65.6* 67.5*  PLT 415* 629   Basic Metabolic Panel: Recent Labs  Lab 06/07/18 1442 06/08/18 0154  NA 136 135  K 4.4 3.4*  CL 103 102  CO2 23 26  GLUCOSE 94 115*  BUN 21* 23*  CREATININE 1.25* 1.27*  CALCIUM 9.0 8.5*   GFR: Estimated Creatinine Clearance: 93.3 mL/min (A) (by C-G formula based on SCr of 1.27 mg/dL (H)). Liver Function Tests: Recent Labs  Lab 06/07/18 1442 06/08/18 0154  AST 20 14*  ALT 19 16  ALKPHOS 61 53  BILITOT 0.5 1.9*  PROT 7.2 6.5  ALBUMIN 3.6 3.3*   No results for input(s): LIPASE, AMYLASE in the last 168 hours. Recent Labs  Lab 06/07/18 1519  AMMONIA 18   Coagulation Profile: Recent Labs  Lab 06/07/18 1537  INR 1.17   Cardiac Enzymes: Recent Labs  Lab 06/08/18 0154 06/08/18 0804 06/08/18 1349  TROPONINI <0.03 <0.03 <0.03   BNP (last 3 results) No results for input(s): PROBNP in the last 8760 hours. HbA1C: No results for input(s): HGBA1C in the last 72 hours. CBG: Recent Labs  Lab 06/07/18 1344 06/07/18 1523  GLUCAP 95 122*   Lipid Profile: No results for input(s): CHOL, HDL, LDLCALC, TRIG, CHOLHDL, LDLDIRECT in the last 72 hours. Thyroid Function Tests: Recent Labs    06/08/18 0154  TSH 1.408   Anemia Panel: Recent Labs    06/07/18 1442 06/07/18 1746  VITAMINB12  --  210  FOLATE 10.3  --   FERRITIN  --  3*  TIBC  --  390  IRON  --  13*  RETICCTPCT 1.7  --       Radiology Studies: I have reviewed all of the imaging during this hospital visit personally     Scheduled Meds: . amiodarone  200 mg Oral Daily  . aspirin EC  81 mg Oral Daily  . atorvastatin  80 mg Oral QPM  . clopidogrel  75 mg Oral Q  breakfast  . pantoprazole  40 mg Oral Daily  . potassium chloride  10 mEq Oral Daily   Continuous Infusions: . sodium chloride Stopped (06/08/18 1200)     LOS: 1 day        Ariane Ditullio Gerome Apley, MD [facility] Pager [phone]

## 2018-06-09 DIAGNOSIS — G40909 Epilepsy, unspecified, not intractable, without status epilepticus: Secondary | ICD-10-CM | POA: Diagnosis not present

## 2018-06-09 LAB — CBC
HCT: 29.5 % — ABNORMAL LOW (ref 39.0–52.0)
HEMOGLOBIN: 8.4 g/dL — AB (ref 13.0–17.0)
MCH: 19.5 pg — ABNORMAL LOW (ref 26.0–34.0)
MCHC: 28.5 g/dL — AB (ref 30.0–36.0)
MCV: 68.6 fL — ABNORMAL LOW (ref 78.0–100.0)
Platelets: 374 10*3/uL (ref 150–400)
RBC: 4.3 MIL/uL (ref 4.22–5.81)
RDW: 21 % — ABNORMAL HIGH (ref 11.5–15.5)
WBC: 9.4 10*3/uL (ref 4.0–10.5)

## 2018-06-09 LAB — GLIADIN ANTIBODIES, SERUM
Antigliadin Abs, IgA: 9 units (ref 0–19)
Gliadin IgG: 2 units (ref 0–19)

## 2018-06-09 LAB — MULTIPLE MYELOMA PANEL, SERUM
ALBUMIN SERPL ELPH-MCNC: 3.1 g/dL (ref 2.9–4.4)
ALPHA 1: 0.2 g/dL (ref 0.0–0.4)
Albumin/Glob SerPl: 1.1 (ref 0.7–1.7)
Alpha2 Glob SerPl Elph-Mcnc: 0.7 g/dL (ref 0.4–1.0)
B-Globulin SerPl Elph-Mcnc: 1.1 g/dL (ref 0.7–1.3)
GAMMA GLOB SERPL ELPH-MCNC: 1.1 g/dL (ref 0.4–1.8)
GLOBULIN, TOTAL: 3 g/dL (ref 2.2–3.9)
IGA: 510 mg/dL — AB (ref 90–386)
IgG (Immunoglobin G), Serum: 1044 mg/dL (ref 700–1600)
IgM (Immunoglobulin M), Srm: 125 mg/dL (ref 20–172)
Total Protein ELP: 6.1 g/dL (ref 6.0–8.5)

## 2018-06-09 LAB — BASIC METABOLIC PANEL
ANION GAP: 7 (ref 5–15)
BUN: 20 mg/dL (ref 6–20)
CHLORIDE: 105 mmol/L (ref 98–111)
CO2: 29 mmol/L (ref 22–32)
CREATININE: 1.17 mg/dL (ref 0.61–1.24)
Calcium: 9 mg/dL (ref 8.9–10.3)
GFR calc non Af Amer: >60 mL/min (ref >60)
Glucose, Bld: 86 mg/dL (ref 70–99)
POTASSIUM: 4.2 mmol/L (ref 3.5–5.1)
SODIUM: 141 mmol/L (ref 135–145)

## 2018-06-09 LAB — TISSUE TRANSGLUTAMINASE, IGA: Tissue Transglutaminase Ab, IgA: <2 U/mL (ref 0–3)

## 2018-06-09 LAB — HIV ANTIBODY (ROUTINE TESTING W REFLEX): HIV SCREEN 4TH GENERATION: NONREACTIVE

## 2018-06-09 MED ORDER — POLYETHYLENE GLYCOL 3350 17 G PO PACK: 17.0000 g | PACK | Freq: Every day | ORAL | 1 refills | Status: DC | PRN

## 2018-06-09 MED ORDER — FERROUS SULFATE 325 (65 FE) MG PO TABS: 325.0000 mg | ORAL_TABLET | Freq: Three times a day (TID) | ORAL | 1 refills | Status: DC

## 2018-06-09 MED ORDER — LORAZEPAM 1 MG PO TABS
1.0000 mg | ORAL_TABLET | Freq: Once | ORAL | Status: AC
Start: 2018-06-09 — End: 2018-06-09
  Administered 2018-06-09: 1 mg via ORAL
  Filled 2018-06-09: qty 1

## 2018-06-09 NOTE — Discharge Summary (Signed)
Physician Discharge Summary  [facility] BOF:[id number] DOB: April 14, 1961 DOA: 06/07/2018  PCP: Patient, No Pcp Per  Admit date: 06/07/2018 Discharge date: 06/09/2018  Admitted From: Disposition:    Recommendations for Outpatient Follow-up:  1. Follow up with PCP in 1-2 weeks 2. Please obtain BMP/CBC in one week your next doctors visit.  3. Take iron supplements as prescribed along with bowel regimen as necessary for constipation 4. Follow-up outpatient with gastroenterology for endoscopy/colonoscopy.  Discharge Condition: Stable CODE STATUS: Full code Diet recommendation: Cardiac diet  Brief/Interim Summary: 57 year old with a history of hypertension, hyperlipidemia, coronary artery disease, congestive heart failure came to the hospital for change in mental status.  Apparently there was concern that he may have had tonic-clonic seizure therefore was admitted to the hospital.  He was also noted that he may have had a brief episode of another seizure in the ER.  He was noted to be anemic and hypotensive.  Neurology was consulted and EEG was performed which was negative for any epileptic activity.  His hemoglobin was noted to be low with suggestion of iron deficiency anemia.  His iron studies confirmed iron deficiency anemia therefore started on supplements.  He was recommended to follow-up outpatient gastroenterology for EGD and colonoscopy outpatient. At this point he is reached maximum benefit from hospital stay and stable to be discharged with outpatient follow-up recommendations as stated above.   Discharge Diagnoses:  Principal Problem:   Seizure disorder [facility]) Active Problems:   Anemia  Tonic-clonic/generalized seizure - Possibly secondary to anemia, hypotension and electrolyte imbalance.  Evaluated by neurology and EEG was performed which was negative for any epileptic episode.  No seizure medications recommended at this time  Symptomatic anemia, iron  deficiency -Patient is profoundly iron deficient with ferritin less than 3.  He was given 1 dose of IV iron followed by oral iron supplements.  He is advised to follow-up outpatient with gastroenterology for endoscopy/colonoscopy on nonemergent basis.  Will need to hold Plavix 5 days prior to procedure -Was evaluated by gastroenterology in the hospital  Coronary artery disease Congestive heart failure -Currently no chest pain.  Continue his home regimen of statin and dual antiplatelet therapy.  Discharge Instructions   Allergies as of 06/09/2018   No Known Allergies     Medication List    TAKE these medications   amiodarone 200 MG tablet Commonly known as:  PACERONE Take 1 tablet (200 mg total) by mouth daily.   amLODipine 10 MG tablet Commonly known as:  NORVASC Take 5 mg by mouth daily.   aspirin 81 MG tablet Take 81 mg by mouth daily.   atorvastatin 80 MG tablet Commonly known as:  LIPITOR TAKE ONE TABLET BY MOUTH DAILY IN THE EVENING   carvedilol 80 MG 24 hr capsule Commonly known as:  COREG CR Take 1 capsule (80 mg total) by mouth daily.   clopidogrel 75 MG tablet Commonly known as:  PLAVIX TAKE 1 TABLET BY MOUTH EVERY DAY WITH BREAKFAST   ferrous sulfate 325 (65 FE) MG tablet Take 1 tablet (325 mg total) by mouth 3 (three) times daily with meals.   hydrochlorothiazide 25 MG tablet Commonly known as:  HYDRODIURIL TAKE 1 TABLET BY MOUTH EVERY DAY   pantoprazole 40 MG tablet Commonly known as:  PROTONIX Take 1 tablet (40 mg total) daily by mouth.   polyethylene glycol packet Commonly known as:  MIRALAX / GLYCOLAX Take 17 g by mouth daily as needed for moderate constipation or severe constipation.  potassium chloride 10 MEQ tablet Commonly known as:  K-DUR,KLOR-CON TAKE 1 TABLET BY MOUTH EVERY DAY   sacubitril-valsartan 97-103 MG Commonly known as:  ENTRESTO Take 1 tablet by mouth 2 (two) times daily.   sildenafil 50 MG tablet Commonly known as:   VIAGRA TAKE ONE TABLET BY MOUTH DAILY AS NEEDED FOR ERECTILE DYSFUNCTION What changed:    how much to take  how to take this  when to take this  reasons to take this  additional instructions      Follow-up Information    [place]. Schedule an appointment as soon as possible for a visit in 2 week(s).   Contact information: [place] [id number] [phone]         No Known Allergies  You were cared for by a hospitalist during your hospital stay. If you have any questions about your discharge medications or the care you received while you were in the hospital after you are discharged, you can call the unit and asked to speak with the hospitalist on call if the hospitalist that took care of you is not available. Once you are discharged, your primary care physician will handle any further medical issues. Please note that no refills for any discharge medications will be authorized once you are discharged, as it is imperative that you return to your primary care physician (or establish a relationship with a primary care physician if you do not have one) for your aftercare needs so that they can reassess your need for medications and monitor your lab values.  Consultations:  Neurology  Gastroenterology   Procedures/Studies: Ct Head Wo Contrast  Result Date: 06/07/2018 CLINICAL DATA:  57 y/o  M; Seizure, new, nontraumatic, >40 yrs. EXAM: CT HEAD WITHOUT CONTRAST TECHNIQUE: Contiguous axial images were obtained from the base of the skull through the vertex without intravenous contrast. COMPARISON:  None. FINDINGS: Brain: No evidence of acute infarction, hemorrhage, hydrocephalus, extra-axial collection or mass lesion/mass effect. Few scattered foci of hypoattenuation are present throughout white matter compatible with mild chronic microvascular ischemic changes. Mild brain parenchymal volume loss. The sella turcica.  Vascular: Calcific atherosclerosis of the carotid siphons and vertebral arteries. Skull: Normal. Negative for fracture or focal lesion. Sinuses/Orbits: No acute finding. Other: None. IMPRESSION: 1. No acute intracranial abnormality identified. 2. Mild chronic microvascular ischemic changes and parenchymal volume loss of the brain. Electronically Signed   By: Kristine Garbe M.D.   On: 06/07/2018 17:01      Subjective: No complaints today, eager to go home.  General = no fevers, chills, dizziness, malaise, fatigue HEENT/EYES = negative for pain, redness, loss of vision, double vision, blurred vision, loss of hearing, sore throat, hoarseness, dysphagia Cardiovascular= negative for chest pain, palpitation, murmurs, lower extremity swelling Respiratory/lungs= negative for shortness of breath, cough, hemoptysis, wheezing, mucus production Gastrointestinal= negative for nausea, vomiting,, abdominal pain, melena, hematemesis Genitourinary= negative for Dysuria, Hematuria, Change in Urinary Frequency MSK = Negative for arthralgia, myalgias, Back Pain, Joint swelling  Neurology= Negative for headache, seizures, numbness, tingling  Psychiatry= Negative for anxiety, depression, suicidal and homocidal ideation Allergy/Immunology= Medication/Food allergy as listed  Skin= Negative for Rash, lesions, ulcers, itching   Discharge Exam: Vitals:   06/08/18 2042 06/09/18 0505  BP:  116/79  Pulse:  71  Resp: 18 18  Temp:  98 F (36.7 C)  SpO2: 98% 97%   Vitals:   06/08/18 0538 06/08/18 2019 06/08/18 2042 06/09/18 0505  BP:  97/65 116/83  116/79  Pulse: 67 (!) 57  71  Resp:  18 18 18   Temp:  98.9 F (37.2 C)  98 F (36.7 C)  TempSrc:  Oral    SpO2: 98% 98% 98% 97%  Weight:      Height:        General: Pt is alert, awake, not in acute distress Cardiovascular: RRR, S1/S2 +, no rubs, no gallops Respiratory: CTA bilaterally, no wheezing, no rhonchi Abdominal: Soft, NT, ND, bowel sounds  + Extremities: no edema, no cyanosis    The results of significant diagnostics from this hospitalization (including imaging, microbiology, ancillary and laboratory) are listed below for reference.     Microbiology: No results found for this or any previous visit (from the past 240 hour(s)).   Labs: BNP (last 3 results) No results for input(s): BNP in the last 8760 hours. Basic Metabolic Panel: Recent Labs  Lab 06/07/18 1442 06/08/18 0154 06/09/18 0439  NA 136 135 141  K 4.4 3.4* 4.2  CL 103 102 105  CO2 23 26 29   GLUCOSE 94 115* 86  BUN 21* 23* 20  CREATININE 1.25* 1.27* 1.17  CALCIUM 9.0 8.5* 9.0   Liver Function Tests: Recent Labs  Lab 06/07/18 1442 06/08/18 0154  AST 20 14*  ALT 19 16  ALKPHOS 61 53  BILITOT 0.5 1.9*  PROT 7.2 6.5  ALBUMIN 3.6 3.3*   No results for input(s): LIPASE, AMYLASE in the last 168 hours. Recent Labs  Lab 06/07/18 1519  AMMONIA 18   CBC: Recent Labs  Lab 06/07/18 1442 06/08/18 0154 06/09/18 0439  WBC 9.3 8.0 9.4  HGB 6.9* 7.5* 8.4*  HCT 25.4* 26.6* 29.5*  MCV 65.6* 67.5* 68.6*  PLT 415* 347 374   Cardiac Enzymes: Recent Labs  Lab 06/08/18 0154 06/08/18 0804 06/08/18 1349  TROPONINI <0.03 <0.03 <0.03   BNP: Invalid input(s): POCBNP CBG: Recent Labs  Lab 06/07/18 1344 06/07/18 1523  GLUCAP 95 122*   D-Dimer No results for input(s): DDIMER in the last 72 hours. Hgb A1c No results for input(s): HGBA1C in the last 72 hours. Lipid Profile No results for input(s): CHOL, HDL, LDLCALC, TRIG, CHOLHDL, LDLDIRECT in the last 72 hours. Thyroid function studies Recent Labs    06/08/18 0154  TSH 1.408   Anemia work up Recent Labs    06/07/18 1442 06/07/18 1746  VITAMINB12  --  210  FOLATE 10.3  --   FERRITIN  --  3*  TIBC  --  390  IRON  --  13*  RETICCTPCT 1.7  --    Urinalysis    Component Value Date/Time   COLORURINE YELLOW 06/08/2018 1225   APPEARANCEUR CLEAR 06/08/2018 1225   LABSPEC 1.015  06/08/2018 1225   PHURINE 7.0 06/08/2018 1225   GLUCOSEU NEGATIVE 06/08/2018 1225   HGBUR NEGATIVE 06/08/2018 [place] 06/08/2018 1225   KETONESUR NEGATIVE 06/08/2018 1225   PROTEINUR NEGATIVE 06/08/2018 1225   NITRITE NEGATIVE 06/08/2018 1225   LEUKOCYTESUR NEGATIVE 06/08/2018 1225   Sepsis Labs Invalid input(s): PROCALCITONIN,  WBC,  LACTICIDVEN Microbiology No results found for this or any previous visit (from the past 240 hour(s)).   Time coordinating discharge:  I have spent 35 minutes face to face with the patient and on the ward discussing the patients care, assessment, plan and disposition with other care givers. >50% of the time was devoted counseling the patient about the risks and benefits of treatment/Discharge disposition and coordinating care.  SIGNED:   Damita Lack, MD  [facility] 06/09/2018, 11:12 AM Pager   If 7PM-7AM, please contact night-coverage [email or web address] Password [email or web address]

## 2018-06-09 NOTE — Progress Notes (Signed)
Patient had the Ambien but is now wide awake. PCP was notified

## 2018-06-10 ENCOUNTER — Ambulatory Visit: Payer: Self-pay | Admitting: Adult Health

## 2018-06-10 LAB — RETICULIN ANTIBODIES, IGA W TITER: Reticulin Ab, IgA: NEGATIVE titer (ref <2.5)

## 2018-06-10 NOTE — Progress Notes (Deleted)
Cardiology Office Note   Date:  06/10/2018   ID:  [name], Gregory May 14, 1961, MRN [id number]  PCP:  Patient, No Pcp Per  Cardiologist:  Dr. Sallyanne Kuster  No chief complaint on file.    History of Present Illness: Gregory [name] is a 57 y.o. male who presents for ongoing assessment and management of orthostatic hypotension.Hx of VT associated with a previously implanted ICD in the context of CHF and ICM. When seen by Dr. Caryl Comes on 01/19/2018 he was feeling poorly and was found to be in sustained monomorphic VT with a rate of 160 bpm. His device was reprogrammed to 150 mg. He is managed with amiodarone.   Other history is includes Type II Diabetes CAD with CTO of the RCA with previous DES to the LAD, Diagaonal and Cx. HFrEF with EF of 45%-50%, OSA on CPAP, and hyperlipidemia. He is on maximum dose of Entresto and Coreg CR Due to hypotension, he was taken off of amlodipine,     Past Medical History:  Diagnosis Date  . CAD (coronary artery disease) 12/01/2013  . Chronic combined systolic and diastolic CHF, NYHA class 1 ([place]) 12/01/2013  . Erectile dysfunction 12/01/2013  . HTN (hypertension) 12/01/2013  . Hyperlipidemia 12/01/2013  . Ischemic cardiomyopathy 12/01/2013  . Sleep apnea     Past Surgical History:  Procedure Laterality Date  . ACHILLES TENDON REPAIR     lft foot  . CARDIAC CATHETERIZATION N/A 05/05/2016   Procedure: Left Heart Cath and Coronary Angiography;  Surgeon: Leonie Man, MD;  Location: [facility];  Service: Cardiovascular;  Laterality: N/A;  . CARDIAC DEFIBRILLATOR PLACEMENT    . EP IMPLANTABLE DEVICE N/A 12/19/2016   Procedure: ICD Generator Changeout;  Surgeon: [name], MD;  Location: [facility];  Service: Cardiovascular;  Laterality: N/A;  . WRIST TENODESIS       Current Outpatient Medications  Medication Sig Dispense Refill  . amiodarone (PACERONE) 200 MG tablet Take 1 tablet (200 mg total) by mouth daily. 90  tablet 3  . amLODipine (NORVASC) 10 MG tablet Take 5 mg by mouth daily.    Marland Kitchen aspirin 81 MG tablet Take 81 mg by mouth daily.    Marland Kitchen atorvastatin (LIPITOR) 80 MG tablet TAKE ONE TABLET BY MOUTH DAILY IN THE EVENING 30 tablet 11  . carvedilol (COREG CR) 80 MG 24 hr capsule Take 1 capsule (80 mg total) by mouth daily. 30 capsule 5  . clopidogrel (PLAVIX) 75 MG tablet TAKE 1 TABLET BY MOUTH EVERY DAY WITH BREAKFAST 90 tablet 2  . ferrous sulfate 325 (65 FE) MG tablet Take 1 tablet (325 mg total) by mouth 3 (three) times daily with meals. 90 tablet 1  . hydrochlorothiazide (HYDRODIURIL) 25 MG tablet TAKE 1 TABLET BY MOUTH EVERY DAY 90 tablet 1  . pantoprazole (PROTONIX) 40 MG tablet Take 1 tablet (40 mg total) daily by mouth. 30 tablet 11  . polyethylene glycol (MIRALAX / GLYCOLAX) packet Take 17 g by mouth daily as needed for moderate constipation or severe constipation. 14 each 1  . potassium chloride (K-DUR,KLOR-CON) 10 MEQ tablet TAKE 1 TABLET BY MOUTH EVERY DAY 90 tablet 3  . sacubitril-valsartan (ENTRESTO) 97-103 MG Take 1 tablet by mouth 2 (two) times daily. 60 tablet 0  . sildenafil (VIAGRA) 50 MG tablet TAKE ONE TABLET BY MOUTH DAILY AS NEEDED FOR ERECTILE DYSFUNCTION (Patient taking differently: Take 50 mg by mouth daily as needed for erectile dysfunction. ) 30 tablet 0  No current facility-administered medications for this visit.     Allergies:   Patient has no known allergies.    Social History:  The patient  reports that he quit smoking about 5 years ago. His smoking use included cigarettes. He has a 15.00 pack-year smoking history. He has never used smokeless tobacco. He reports that he does not drink alcohol.   Family History:  The patient's family history includes Brain cancer in his father; Heart disease in his mother; Hypertension in his daughter.    ROS: All other systems are reviewed and negative. Unless otherwise mentioned in H&P    PHYSICAL EXAM: VS:  There were no vitals  taken for this visit. , BMI There is no height or weight on file to calculate BMI. GEN: Well nourished, well developed, in no acute distress  HEENT: normal  Neck: no JVD, carotid bruits, or masses Cardiac: ***RRR; no murmurs, rubs, or gallops,no edema  Respiratory:  clear to auscultation bilaterally, normal work of breathing GI: soft, nontender, nondistended, + BS MS: no deformity or atrophy  Skin: warm and dry, no rash Neuro:  Strength and sensation are intact Psych: euthymic mood, full affect   EKG:  EKG {ACTION; IS/IS IDH:[id number]} ordered today. The ekg ordered today demonstrates ***   Recent Labs: 06/08/2018: ALT 16; TSH 1.408 06/09/2018: BUN 20; Creatinine, Ser 1.17; Hemoglobin 8.4; Platelets 374; Potassium 4.2; Sodium 141    Lipid Panel    Component Value Date/Time   CHOL 132 10/15/2017 1013   TRIG 57 10/15/2017 1013   HDL 34 (L) 10/15/2017 1013   CHOLHDL 3.9 10/15/2017 1013   CHOLHDL 4.2 05/05/2016 0526   VLDL 11 05/05/2016 0526   LDLCALC 87 10/15/2017 1013      Wt Readings from Last 3 Encounters:  06/07/18 278 lb 3.5 oz (126.2 kg)  05/13/18 286 lb 3.2 oz (129.8 kg)  01/19/18 286 lb 6.4 oz (129.9 kg)      Other studies Reviewed: Additional studies/ records that were reviewed today include: ***. Review of the above records demonstrates: ***   ASSESSMENT AND PLAN:  1.  ***   Current medicines are reviewed at length with the patient today.    Labs/ tests ordered today include: *** [name]. [place] Pugh, ANP, AACC   06/10/2018 7:39 AM    [facility] [address] [place], [place] [address] Phone: [phone]; Fax: [phone]

## 2018-06-19 ENCOUNTER — Other Ambulatory Visit: Payer: Self-pay | Admitting: Cardiology

## 2018-06-19 ENCOUNTER — Other Ambulatory Visit: Payer: Self-pay | Admitting: Cardiovascular Disease

## 2018-06-19 DIAGNOSIS — I472 Ventricular tachycardia, unspecified: Secondary | ICD-10-CM

## 2018-06-19 DIAGNOSIS — I493 Ventricular premature depolarization: Secondary | ICD-10-CM

## 2018-06-19 DIAGNOSIS — E785 Hyperlipidemia, unspecified: Secondary | ICD-10-CM

## 2018-06-19 DIAGNOSIS — I255 Ischemic cardiomyopathy: Secondary | ICD-10-CM

## 2018-06-19 DIAGNOSIS — I5022 Chronic systolic (congestive) heart failure: Secondary | ICD-10-CM

## 2018-06-21 NOTE — Telephone Encounter (Signed)
Rx sent to pharmacy   

## 2018-06-29 ENCOUNTER — Ambulatory Visit (INDEPENDENT_AMBULATORY_CARE_PROVIDER_SITE_OTHER): Payer: Worker's Compensation | Admitting: *Deleted

## 2018-06-29 ENCOUNTER — Ambulatory Visit: Payer: Worker's Compensation | Attending: Family Medicine | Admitting: Physician Assistant

## 2018-06-29 VITALS — BP 105/67 | HR 70 | Temp 98.6°F | Resp 18 | Ht 78.0 in | Wt 281.0 lb

## 2018-06-29 DIAGNOSIS — Z8249 Family history of ischemic heart disease and other diseases of the circulatory system: Secondary | ICD-10-CM | POA: Diagnosis not present

## 2018-06-29 DIAGNOSIS — Z79899 Other long term (current) drug therapy: Secondary | ICD-10-CM | POA: Diagnosis not present

## 2018-06-29 DIAGNOSIS — G4733 Obstructive sleep apnea (adult) (pediatric): Secondary | ICD-10-CM | POA: Diagnosis not present

## 2018-06-29 DIAGNOSIS — I255 Ischemic cardiomyopathy: Secondary | ICD-10-CM | POA: Insufficient documentation

## 2018-06-29 DIAGNOSIS — R4182 Altered mental status, unspecified: Secondary | ICD-10-CM

## 2018-06-29 DIAGNOSIS — Z7902 Long term (current) use of antithrombotics/antiplatelets: Secondary | ICD-10-CM | POA: Diagnosis not present

## 2018-06-29 DIAGNOSIS — I2510 Atherosclerotic heart disease of native coronary artery without angina pectoris: Secondary | ICD-10-CM | POA: Diagnosis not present

## 2018-06-29 DIAGNOSIS — Z95810 Presence of automatic (implantable) cardiac defibrillator: Secondary | ICD-10-CM | POA: Diagnosis not present

## 2018-06-29 DIAGNOSIS — Z7982 Long term (current) use of aspirin: Secondary | ICD-10-CM | POA: Diagnosis not present

## 2018-06-29 DIAGNOSIS — I110 Hypertensive heart disease with heart failure: Secondary | ICD-10-CM | POA: Insufficient documentation

## 2018-06-29 DIAGNOSIS — N529 Male erectile dysfunction, unspecified: Secondary | ICD-10-CM | POA: Insufficient documentation

## 2018-06-29 DIAGNOSIS — D509 Iron deficiency anemia, unspecified: Secondary | ICD-10-CM

## 2018-06-29 DIAGNOSIS — Z9889 Other specified postprocedural states: Secondary | ICD-10-CM | POA: Diagnosis not present

## 2018-06-29 DIAGNOSIS — E785 Hyperlipidemia, unspecified: Secondary | ICD-10-CM | POA: Insufficient documentation

## 2018-06-29 DIAGNOSIS — I472 Ventricular tachycardia: Secondary | ICD-10-CM | POA: Insufficient documentation

## 2018-06-29 DIAGNOSIS — Z808 Family history of malignant neoplasm of other organs or systems: Secondary | ICD-10-CM | POA: Diagnosis not present

## 2018-06-29 DIAGNOSIS — N179 Acute kidney failure, unspecified: Secondary | ICD-10-CM | POA: Diagnosis not present

## 2018-06-29 DIAGNOSIS — I5042 Chronic combined systolic (congestive) and diastolic (congestive) heart failure: Secondary | ICD-10-CM | POA: Diagnosis not present

## 2018-06-29 DIAGNOSIS — R569 Unspecified convulsions: Secondary | ICD-10-CM

## 2018-06-29 NOTE — Progress Notes (Signed)
Remote ICD transmission.   

## 2018-06-29 NOTE — Patient Instructions (Signed)
Stomach doctor-Dr. Alessandra Bevels Schedule your endoscopy and colonoscopy asap Keep all follow ups Take all medications Call us if needed Nice to meet U!

## 2018-06-29 NOTE — Progress Notes (Signed)
Gregory [name]  ZMO:[id number]  KPT:[id number]  DOB - 17-Aug-1961  Chief Complaint  Patient presents with  . Hospitalization Follow-up       Subjective:   Gregory [name] is a 57 y.o. male here today for establishment of care.  He has a past medical history of chronic anemia, iron deficiency, combined systolic and diastolic congestive heart failure, coronary artery disease with previous intervention to the LAD, diagonal and circumflex, ischemic cardiomyopathy with now preserved ejection fraction, sustained ventricular tachycardia with ICD in situ status post generator change in January 2019, obstructive sleep apnea on CPAP, hyperlipidemia and erectile dysfunction.  He was hospitalized 06/07/2018 through 06/09/2018 after he presented to the emergency department with altered mental status with possible seizure activity and near syncope.  A CT scan of the head was negative for an acute process.  He was hypotensive and his hemoglobin was down to 6.9.  His creatinine was slightly up at 1.2.  The internal medicine team admitted him and he had consultation from neurology.  An EEG was negative for seizure-like activity.  He was treated with 1 dose of Keppra.  Gastroenterology saw him as well.  He did not have a blood transfusion.  His FOBT was negative.  He was started on iron supplementations.  He was told that he needs to have an endoscopy and colonoscopy done as an outpatient.  He has not yet scheduled his gastroenterology follow-up.  He is waiting for his family to decide the best time in regards to transportation.  No further episodes of altered mental status or near syncope.  No chest pain.  No shortness of breath.  Sees his cardiologist regularly for the last 10 years.  Has his ICD checked every 3 months as indicated.  No new complaints today.  He does state that the arm aches is still a little more formed and darker. Appetite good.   ROS: GEN: denies fever or chills, denies change in weight Skin:  denies lesions or rashes HEENT: denies headache, earache, epistaxis, sore throat, or neck pain LUNGS: denies SHOB, dyspnea, PND, orthopnea CV: denies CP or palpitations ABD: denies abd pain, N or V EXT: denies muscle spasms or swelling; no pain in lower ext, no weakness NEURO: denies numbness or tingling, denies sz, stroke or TIA  ALLERGIES: No Known Allergies  PAST MEDICAL HISTORY: Past Medical History:  Diagnosis Date  . CAD (coronary artery disease) 12/01/2013  . Chronic combined systolic and diastolic CHF, NYHA class 1 ([place]) 12/01/2013  . Erectile dysfunction 12/01/2013  . HTN (hypertension) 12/01/2013  . Hyperlipidemia 12/01/2013  . Ischemic cardiomyopathy 12/01/2013  . Sleep apnea     PAST SURGICAL HISTORY: Past Surgical History:  Procedure Laterality Date  . ACHILLES TENDON REPAIR     lft foot  . CARDIAC CATHETERIZATION N/A 05/05/2016   Procedure: Left Heart Cath and Coronary Angiography;  Surgeon: Leonie Man, MD;  Location: [facility];  Service: Cardiovascular;  Laterality: N/A;  . CARDIAC DEFIBRILLATOR PLACEMENT    . EP IMPLANTABLE DEVICE N/A 12/19/2016   Procedure: ICD Generator Changeout;  Surgeon: [name], MD;  Location: [facility];  Service: Cardiovascular;  Laterality: N/A;  . WRIST TENODESIS      MEDICATIONS AT HOME: Prior to Admission medications   Medication Sig Start Date End Date Taking? Authorizing Provider  amiodarone (PACERONE) 200 MG tablet Take 1 tablet (200 mg total) by mouth daily. 05/18/18  Yes Croitoru, [name], MD  aspirin 81 MG tablet Take  81 mg by mouth daily.   Yes [provider]  atorvastatin (LIPITOR) 80 MG tablet TAKE ONE TABLET BY MOUTH DAILY IN THE EVENING 03/12/18  Yes Croitoru, [name], MD  carvedilol (COREG CR) 80 MG 24 hr capsule Take 1 capsule (80 mg total) by mouth daily. 02/15/18  Yes Croitoru, [name], MD  clopidogrel (PLAVIX) 75 MG tablet TAKE 1 TABLET BY MOUTH EVERY DAY WITH BREAKFAST 10/29/17  Yes  Croitoru, [name], MD  ferrous sulfate 325 (65 FE) MG tablet Take 1 tablet (325 mg total) by mouth 3 (three) times daily with meals. 06/09/18 06/09/19 Yes Amin, Ankit Chirag, MD  hydrochlorothiazide (HYDRODIURIL) 25 MG tablet TAKE 1 TABLET BY MOUTH EVERY DAY 04/19/18  Yes Troy Sine, MD  pantoprazole (PROTONIX) 40 MG tablet Take 1 tablet (40 mg total) daily by mouth. 11/02/17  Yes Kilroy, Luke K, PA-C  polyethylene glycol (MIRALAX / GLYCOLAX) packet Take 17 g by mouth daily as needed for moderate constipation or severe constipation. 06/09/18  Yes Amin, Ankit Chirag, MD  potassium chloride (K-DUR,KLOR-CON) 10 MEQ tablet TAKE 1 TABLET BY MOUTH EVERY DAY 05/14/18  Yes Croitoru, [name], MD  sacubitril-valsartan (ENTRESTO) 97-103 MG Take 1 tablet by mouth 2 (two) times daily. 05/07/18  Yes Croitoru, [name], MD  sildenafil (VIAGRA) 50 MG tablet TAKE ONE TABLET BY MOUTH DAILY AS NEEDED FOR ERECTILE DYSFUNCTION 06/21/18  Yes Croitoru, [name], MD    Family History  Problem Relation Age of Onset  . Heart disease Mother   . Brain cancer Father   . Hypertension Daughter    Social-retired, grown children, nonsmoker  Objective:   Vitals:   06/29/18 1436  BP: 105/67  Pulse: 70  Resp: 18  Temp: 98.6 F (37 C)  TempSrc: Oral  SpO2: 94%  Weight: 281 lb (127.5 kg)  Height: 6\' 6"  (1.981 m)    Exam General appearance : Awake, alert, not in any distress. Speech Clear. Not toxic looking HEENT: Atraumatic and Normocephalic, pupils equally reactive to light and accomodation Neck: supple, no JVD. No cervical lymphadenopathy.  Chest:Good air entry bilaterally, no added sounds  CVS: S1 S2 regular, no murmurs.  Abdomen: Bowel sounds present, Non tender and not distended with no guarding, rigidity or rebound. Extremities: B/L Lower Ext shows no edema, both legs are warm to touch Neurology: Awake alert, and oriented X 3, CN II-XII intact, Non focal Skin:No Rash Wounds:N/A  Data Review Lab Results  Component  Value Date   HGBA1C 5.7 (H) 02/28/2014   HGBA1C  10/29/2009    5.7 (NOTE) The ADA recommends the following therapeutic goal for glycemic control related to Hgb A1c measurement: Goal of therapy: <6.5 Hgb A1c  Reference: American Diabetes Association: Clinical Practice Recommendations 2010, Diabetes Care, 2010, 33: (Suppl  1).     Assessment & Plan  1. AMS/Presyncope/SZ like Activity 2/2 # 2  -nonrecurrent-no change  2. Acute on Chronic Fe Def Anemia  -CBC today  -cont Fe 3X daily  -GI workup underway>endo/colon pending 3. AKI-improved  -BMP today  Return in about 1 month (around 07/27/2018).  The patient was given clear instructions to go to ER or return to medical center if symptoms don't improve, worsen or new problems develop. The patient verbalized understanding. The patient was told to call to get lab results if they haven't heard anything in the next week.   Total time spent with patient was 32 min. Greater than 50 % of this visit was spent face to face counseling and coordinating care  regarding risk factor modification, compliance importance and encouragement, education related to hospitalization.  This note has been created with Surveyor, quantity. Any transcriptional errors are unintentional.    Zettie Pho, PA-C [facility] [place], [name]   06/29/2018, 2:50 PM

## 2018-06-30 ENCOUNTER — Encounter: Payer: Self-pay | Admitting: Cardiology

## 2018-07-01 LAB — CBC WITH DIFFERENTIAL/PLATELET
BASOS: 0 %
Basophils Absolute: 0 10*3/uL (ref 0.0–0.2)
EOS (ABSOLUTE): 0.3 10*3/uL (ref 0.0–0.4)
EOS: 4 %
HEMOGLOBIN: 11.4 g/dL — AB (ref 13.0–17.7)
Hematocrit: 38.2 % (ref 37.5–51.0)
Immature Grans (Abs): 0 10*3/uL (ref 0.0–0.1)
Immature Granulocytes: 0 %
LYMPHS ABS: 1.4 10*3/uL (ref 0.7–3.1)
Lymphs: 18 %
MCH: 25.2 pg — ABNORMAL LOW (ref 26.6–33.0)
MCHC: 29.8 g/dL — AB (ref 31.5–35.7)
MCV: 84 fL (ref 79–97)
MONOCYTES: 7 %
Monocytes Absolute: 0.6 10*3/uL (ref 0.1–0.9)
NEUTROS ABS: 5.6 10*3/uL (ref 1.4–7.0)
Neutrophils: 71 %
Platelets: 291 10*3/uL (ref 150–450)
RBC: 4.53 x10E6/uL (ref 4.14–5.80)
WBC: 8 10*3/uL (ref 3.4–10.8)

## 2018-07-01 LAB — BASIC METABOLIC PANEL
BUN/Creatinine Ratio: 16 (ref 9–20)
BUN: 17 mg/dL (ref 6–24)
CALCIUM: 8.9 mg/dL (ref 8.7–10.2)
CO2: 24 mmol/L (ref 20–29)
CREATININE: 1.08 mg/dL (ref 0.76–1.27)
Chloride: 103 mmol/L (ref 96–106)
GFR calc Af Amer: 86 mL/min/{1.73_m2} (ref >59)
GFR calc non Af Amer: 75 mL/min/{1.73_m2} (ref >59)
Glucose: 107 mg/dL — ABNORMAL HIGH (ref 65–99)
Potassium: 4 mmol/L (ref 3.5–5.2)
SODIUM: 142 mmol/L (ref 134–144)

## 2018-07-02 ENCOUNTER — Telehealth: Payer: Self-pay | Admitting: *Deleted

## 2018-07-02 LAB — CUP PACEART REMOTE DEVICE CHECK
Battery Remaining Longevity: 112 mo
Battery Voltage: 2.97 V
Brady Statistic AP VS Percent: 47.6 %
Brady Statistic AS VP Percent: 0.15 %
Brady Statistic AS VS Percent: 51.53 %
Brady Statistic RA Percent Paced: 48.32 %
Date Time Interrogation Session: 20190716041703
HighPow Impedance: 47 Ohm
HighPow Impedance: 63 Ohm
Implantable Lead Implant Date: 20051120
Implantable Lead Location: 753859
Implantable Lead Model: 5076
Implantable Lead Model: 7121
Lead Channel Impedance Value: 456 Ohm
Lead Channel Pacing Threshold Pulse Width: 0.4 ms
Lead Channel Sensing Intrinsic Amplitude: 3.875 mV
Lead Channel Sensing Intrinsic Amplitude: 3.875 mV
MDC IDC LEAD IMPLANT DT: 20101117
MDC IDC LEAD LOCATION: 753860
MDC IDC MSMT LEADCHNL RA IMPEDANCE VALUE: 399 Ohm
MDC IDC MSMT LEADCHNL RA PACING THRESHOLD AMPLITUDE: 0.5 V
MDC IDC MSMT LEADCHNL RV IMPEDANCE VALUE: 304 Ohm
MDC IDC MSMT LEADCHNL RV PACING THRESHOLD AMPLITUDE: 1 V
MDC IDC MSMT LEADCHNL RV PACING THRESHOLD PULSEWIDTH: 0.4 ms
MDC IDC MSMT LEADCHNL RV SENSING INTR AMPL: 16.5 mV
MDC IDC MSMT LEADCHNL RV SENSING INTR AMPL: 16.5 mV
MDC IDC PG IMPLANT DT: 20180105
MDC IDC SET LEADCHNL RA PACING AMPLITUDE: 1.5 V
MDC IDC SET LEADCHNL RV PACING AMPLITUDE: 2 V
MDC IDC SET LEADCHNL RV PACING PULSEWIDTH: 0.4 ms
MDC IDC SET LEADCHNL RV SENSING SENSITIVITY: 0.6 mV
MDC IDC STAT BRADY AP VP PERCENT: 0.71 %
MDC IDC STAT BRADY RV PERCENT PACED: 0.92 %

## 2018-07-02 NOTE — Telephone Encounter (Signed)
Patient returned call and was informed of lab results. 

## 2018-07-02 NOTE — Telephone Encounter (Signed)
Attempt to call patient to inform of lab results. Left message on voicemail to return call.   Notes recorded by Brayton Caves, PA-C on 07/01/2018 at 3:38 PM EDT pls let him know that his blood count and kidney function is now stable

## 2018-07-08 ENCOUNTER — Other Ambulatory Visit: Payer: Self-pay | Admitting: *Deleted

## 2018-07-08 MED ORDER — SACUBITRIL-VALSARTAN 97-103 MG PO TABS: 1.0000 | ORAL_TABLET | Freq: Two times a day (BID) | ORAL | 11 refills | Status: DC

## 2018-07-20 ENCOUNTER — Other Ambulatory Visit: Payer: Self-pay | Admitting: Cardiovascular Disease

## 2018-07-26 ENCOUNTER — Other Ambulatory Visit: Payer: Self-pay

## 2018-07-27 MED ORDER — SILDENAFIL CITRATE 50 MG PO TABS: ORAL_TABLET | ORAL | 1 refills | Status: DC

## 2018-07-30 ENCOUNTER — Encounter: Payer: Self-pay | Admitting: Internal Medicine

## 2018-07-30 ENCOUNTER — Ambulatory Visit: Payer: Worker's Compensation | Attending: Internal Medicine | Admitting: Internal Medicine

## 2018-07-30 VITALS — BP 125/84 | HR 72 | Temp 98.0°F | Ht 78.0 in | Wt 282.4 lb

## 2018-07-30 DIAGNOSIS — Z955 Presence of coronary angioplasty implant and graft: Secondary | ICD-10-CM | POA: Insufficient documentation

## 2018-07-30 DIAGNOSIS — I509 Heart failure, unspecified: Secondary | ICD-10-CM | POA: Diagnosis present

## 2018-07-30 DIAGNOSIS — I110 Hypertensive heart disease with heart failure: Secondary | ICD-10-CM | POA: Diagnosis present

## 2018-07-30 DIAGNOSIS — D509 Iron deficiency anemia, unspecified: Secondary | ICD-10-CM | POA: Diagnosis not present

## 2018-07-30 DIAGNOSIS — R569 Unspecified convulsions: Secondary | ICD-10-CM | POA: Diagnosis not present

## 2018-07-30 DIAGNOSIS — Z7982 Long term (current) use of aspirin: Secondary | ICD-10-CM | POA: Diagnosis not present

## 2018-07-30 DIAGNOSIS — N529 Male erectile dysfunction, unspecified: Secondary | ICD-10-CM | POA: Insufficient documentation

## 2018-07-30 DIAGNOSIS — D179 Benign lipomatous neoplasm, unspecified: Secondary | ICD-10-CM

## 2018-07-30 DIAGNOSIS — I472 Ventricular tachycardia: Secondary | ICD-10-CM | POA: Diagnosis not present

## 2018-07-30 DIAGNOSIS — I255 Ischemic cardiomyopathy: Secondary | ICD-10-CM | POA: Insufficient documentation

## 2018-07-30 DIAGNOSIS — G4733 Obstructive sleep apnea (adult) (pediatric): Secondary | ICD-10-CM | POA: Insufficient documentation

## 2018-07-30 DIAGNOSIS — Z79899 Other long term (current) drug therapy: Secondary | ICD-10-CM | POA: Insufficient documentation

## 2018-07-30 DIAGNOSIS — I2510 Atherosclerotic heart disease of native coronary artery without angina pectoris: Secondary | ICD-10-CM | POA: Insufficient documentation

## 2018-07-30 DIAGNOSIS — E7800 Pure hypercholesterolemia, unspecified: Secondary | ICD-10-CM | POA: Insufficient documentation

## 2018-07-30 DIAGNOSIS — Z87891 Personal history of nicotine dependence: Secondary | ICD-10-CM | POA: Insufficient documentation

## 2018-07-30 MED ORDER — FERROUS SULFATE 325 (65 FE) MG PO TABS: 325.0000 mg | ORAL_TABLET | Freq: Every day | ORAL | 1 refills | Status: DC

## 2018-07-30 NOTE — Progress Notes (Signed)
Patient ID: Gregory [name], male    DOB: 1961/02/08  MRN: [id number]  CC: Hypertension   Subjective: Gregory [name] is a 57 y.o. male who presents to establish with me as PCP. His concerns today include:  Patient with history of IDA, sys and dia CHF, CAD/ICM now with preserved EF, sustained V. tach with ICD, obstructive sleep apnea on CPAP, HL, ED  Patient seen last month by our PA posthospitalization for change in mental status with questionable seizure activity and worsening iron deficiency anemia.  He apparently was referred to GI in the fall of last year for anemia but never followed up with that.  He is now on iron supplement 3 times a day.  Referred to GI and was called by [facility].  However he states he has not made an appointment as yet because he needs to figure out who would be able to take him and stay with him during EGD/colonoscopy.  Reports that tiredness and dizziness have resolved since being on iron. -He states that he is not driving because he was told not to drive for 6 months due to possible seizure.  He apparently was seen by neurology in the hospital and had negative EEG.  CT of the head was negative for any acute findings.  According to the discharge summary the seizure activity was possibly secondary to anemia, hypotension and electrolyte imbalance.  No seizure medication was recommended    Patient Active Problem List   Diagnosis Date Noted  . Seizure disorder ([place]) 06/07/2018  . Anemia 06/07/2018  . Ventricular tachycardia ([place]) 05/04/2016  . Ischemic cardiomyopathy 12/01/2013  . CAD S/P percutaneous coronary angioplasty 12/01/2013  . Erectile dysfunction 12/01/2013  . Essential hypertension 12/01/2013  . Hypercholesterolemia 12/01/2013  . ICD - dual chamber Medtronic programmed VVI 12/01/2013  . OSA on CPAP 12/01/2013     Current Outpatient Medications on File Prior to Visit  Medication Sig Dispense Refill  . amiodarone (PACERONE) 200 MG tablet  Take 1 tablet (200 mg total) by mouth daily. 90 tablet 3  . aspirin 81 MG tablet Take 81 mg by mouth daily.    Marland Kitchen atorvastatin (LIPITOR) 80 MG tablet TAKE ONE TABLET BY MOUTH DAILY IN THE EVENING 30 tablet 11  . carvedilol (COREG CR) 80 MG 24 hr capsule Take 1 capsule (80 mg total) by mouth daily. 30 capsule 5  . clopidogrel (PLAVIX) 75 MG tablet TAKE 1 TABLET BY MOUTH EVERY DAY WITH BREAKFAST 90 tablet 2  . ferrous sulfate 325 (65 FE) MG tablet Take 1 tablet (325 mg total) by mouth 3 (three) times daily with meals. 90 tablet 1  . hydrochlorothiazide (HYDRODIURIL) 25 MG tablet TAKE 1 TABLET BY MOUTH EVERY DAY 90 tablet 1  . pantoprazole (PROTONIX) 40 MG tablet Take 1 tablet (40 mg total) daily by mouth. 30 tablet 11  . polyethylene glycol (MIRALAX / GLYCOLAX) packet Take 17 g by mouth daily as needed for moderate constipation or severe constipation. 14 each 1  . potassium chloride (K-DUR,KLOR-CON) 10 MEQ tablet TAKE 1 TABLET BY MOUTH EVERY DAY 90 tablet 3  . sacubitril-valsartan (ENTRESTO) 97-103 MG Take 1 tablet by mouth 2 (two) times daily. 60 tablet 11  . sildenafil (VIAGRA) 50 MG tablet TAKE ONE TABLET BY MOUTH DAILY AS NEEDED FOR ERECTILE DYSFUNCTION 30 tablet 0  . sildenafil (VIAGRA) 50 MG tablet PLEASE SCHEDULE AN APPT FOR FUTURE REFILLS 30 tablet 1   No current facility-administered medications on file prior to visit.  No Known Allergies  Social History   Socioeconomic History  . Marital status: Single    Spouse name: Not on file  . Number of children: Not on file  . Years of education: Not on file  . Highest education level: Not on file  Occupational History  . Not on file  Social Needs  . Financial resource strain: Not on file  . Food insecurity:    Worry: Not on file    Inability: Not on file  . Transportation needs:    Medical: Not on file    Non-medical: Not on file  Tobacco Use  . Smoking status: Former Smoker    Packs/day: 0.00    Years: 30.00    Pack years:  0.00    Types: Cigarettes    Last attempt to quit: 05/31/2013    Years since quitting: 5.1  . Smokeless tobacco: Never Used  Substance and Sexual Activity  . Alcohol use: No    Comment: ocassionally  . Drug use: Never  . Sexual activity: Not Currently  Lifestyle  . Physical activity:    Days per week: Not on file    Minutes per session: Not on file  . Stress: Not on file  Relationships  . Social connections:    Talks on phone: Not on file    Gets together: Not on file    Attends religious service: Not on file    Active member of club or organization: Not on file    Attends meetings of clubs or organizations: Not on file    Relationship status: Not on file  . Intimate partner violence:    Fear of current or ex partner: Not on file    Emotionally abused: Not on file    Physically abused: Not on file    Forced sexual activity: Not on file  Other Topics Concern  . Not on file  Social History Narrative  . Not on file    Family History  Problem Relation Age of Onset  . Heart disease Mother   . Brain cancer Father   . Hypertension Daughter     Past Surgical History:  Procedure Laterality Date  . ACHILLES TENDON REPAIR     lft foot  . CARDIAC CATHETERIZATION N/A 05/05/2016   Procedure: Left Heart Cath and Coronary Angiography;  Surgeon: Leonie Man, MD;  Location: [facility];  Service: Cardiovascular;  Laterality: N/A;  . CARDIAC DEFIBRILLATOR PLACEMENT    . EP IMPLANTABLE DEVICE N/A 12/19/2016   Procedure: ICD Generator Changeout;  Surgeon: [name], MD;  Location: [place] [facility];  Service: Cardiovascular;  Laterality: N/A;  . WRIST TENODESIS      ROS: Review of Systems Eye: Patient noted to have soft tissue growth at the corner of both eyes large on the left side.  He reports that he has had these for years and has seen ophthalmologist.  Reports being told that there fatty tissue/lipomas and that as long as they are not affecting his vision they can  be left alone. PHYSICAL EXAM: BP 125/84   Pulse 72   Temp 98 F (36.7 C) (Oral)   Ht 6\' 6"  (1.981 m)   Wt 282 lb 6.4 oz (128.1 kg)   SpO2 96%   BMI 32.63 kg/m   Physical Exam  General appearance - alert, well appearing, older African-American male and in no distress Mental status - normal mood, behavior, speech, dress, motor activity, and thought processes Eyes -nonicteric sclera.  Fleshy soft tissue noted at the corners of both eyes larger on the left side.  Neck - supple, no significant adenopathy Chest - clear to auscultation, no wheezes, rales or rhonchi, symmetric air entry Heart - normal rate, regular rhythm, normal S1, S2, no murmurs, rubs, clicks or gallops Extremities - peripheral pulses normal, no pedal edema, no clubbing or cyanosis  Lab Results  Component Value Date   WBC 8.0 06/29/2018   HGB 11.4 (L) 06/29/2018   HCT 38.2 06/29/2018   MCV 84 06/29/2018   PLT 291 06/29/2018     Chemistry      Component Value Date/Time   NA 142 06/29/2018 1452   K 4.0 06/29/2018 1452   CL 103 06/29/2018 1452   CO2 24 06/29/2018 1452   BUN 17 06/29/2018 1452   CREATININE 1.08 06/29/2018 1452   CREATININE 1.08 12/12/2016 1445      Component Value Date/Time   CALCIUM 8.9 06/29/2018 1452   ALKPHOS 53 06/08/2018 0154   AST 14 (L) 06/08/2018 0154   ALT 16 06/08/2018 0154   BILITOT 1.9 (H) 06/08/2018 0154   BILITOT 0.3 10/15/2017 1013       ASSESSMENT AND PLAN: 1. Iron deficiency anemia, unspecified iron deficiency anemia type Hemoglobin improved.  Advised to cut back on iron supplement to 1 tablet a day. Advised to schedule the appointment with [facility] ASAP.  He will need EGD and colonoscopy.  2. Seizure-like activity ([place]) We will refer to neurology to clarify whether they think this was a seizure episode or not. - Ambulatory referral to Neurology  3. Lipoma, unspecified site Followed by ophthalmology.   Patient was given the opportunity to ask questions.   Patient verbalized understanding of the plan and was able to repeat key elements of the plan.   No orders of the defined types were placed in this encounter.    Requested Prescriptions    No prescriptions requested or ordered in this encounter    No follow-ups on file.  Karle Plumber, MD, FACP

## 2018-07-30 NOTE — Patient Instructions (Signed)
Please schedule the appointment with gastroenterology as soon as you can.    Decrease iron to one tablet a day

## 2018-08-02 ENCOUNTER — Encounter: Payer: Self-pay | Admitting: Neurology

## 2018-08-17 MED ORDER — CLOPIDOGREL BISULFATE 75 MG PO TABS: 75.0000 mg | ORAL_TABLET | Freq: Every day | ORAL | 2 refills | Status: DC

## 2018-08-29 ENCOUNTER — Other Ambulatory Visit: Payer: Self-pay | Admitting: Cardiovascular Disease

## 2018-09-09 ENCOUNTER — Telehealth: Payer: Self-pay | Admitting: Cardiovascular Disease

## 2018-09-09 NOTE — Telephone Encounter (Signed)
Returned a call to the patient. Informed him that I do not see any calls in reference to his request for a new CPAP machine. It was stated to the patient that he has not had a recent appointment with Dr Claiborne Billings to follow up on his CPAP machine. The  last appointment that was scheduled on 10/12/17 was cancelled. Patient was informed that he needs  Appointment with Dr Claiborne Billings before a new machine can be ordered. Patient voiced understanding and a compliance appointment has been scheduled with Dr Claiborne Billings on 09/27/18. Patient was also asked if he is having any problems with his machine, his reply is " No, it's just old and I was told that I am eligible for a new one."

## 2018-09-09 NOTE — Telephone Encounter (Signed)
F/U Message       Patient has called a few times trying to get a new Rx written for a new C-Pap machine, per patient insurance company he is needing a new Rx written.[employer] is the [employer] this.Patient states he has a life time medical claim with this insurance company. Pls call and advise.

## 2018-09-14 ENCOUNTER — Telehealth: Payer: Self-pay | Admitting: Cardiovascular Disease

## 2018-09-14 NOTE — Telephone Encounter (Signed)
New message     *STAT* If patient is at the pharmacy, call can be transferred to refill team.   1. Which medications need to be refilled? (please list name of each medication and dose if known) ferrous sulfate 325 (65 FE) MG tablet  2. Which pharmacy/location (including street and city if local pharmacy) is medication to be sent to?[place], [place] - [address] AT [place]  3. Do they need a 30 day or 90 day supply? [place]

## 2018-09-14 NOTE — Telephone Encounter (Signed)
Please defer to PCP [facility]

## 2018-09-15 ENCOUNTER — Other Ambulatory Visit: Payer: Self-pay | Admitting: *Deleted

## 2018-09-15 MED ORDER — FERROUS SULFATE 325 (65 FE) MG PO TABS: 325.0000 mg | ORAL_TABLET | Freq: Every day | ORAL | 0 refills | Status: DC

## 2018-09-15 NOTE — Telephone Encounter (Signed)
Patient called and advised to request refill from PCP or Karle Plumber @ [facility] who provided last refill/Rx

## 2018-09-20 ENCOUNTER — Encounter

## 2018-09-20 ENCOUNTER — Encounter: Payer: Self-pay | Admitting: Neurology

## 2018-09-20 ENCOUNTER — Ambulatory Visit (INDEPENDENT_AMBULATORY_CARE_PROVIDER_SITE_OTHER): Payer: Self-pay | Admitting: Neurology

## 2018-09-20 ENCOUNTER — Other Ambulatory Visit: Payer: Self-pay

## 2018-09-20 ENCOUNTER — Telehealth: Payer: Self-pay | Admitting: *Deleted

## 2018-09-20 VITALS — BP 110/72 | HR 76 | Ht 78.0 in | Wt 283.0 lb

## 2018-09-20 DIAGNOSIS — R404 Transient alteration of awareness: Secondary | ICD-10-CM

## 2018-09-20 DIAGNOSIS — R55 Syncope and collapse: Secondary | ICD-10-CM

## 2018-09-20 NOTE — Patient Instructions (Signed)
1. Schedule 24-hour EEG 2. Continue fluid hydration, monitor BP and iron levels regularly with your physicians 3. After an episode of loss of consciousness/awareness from any reason, no driving until 6 months event-free 4. Follow-up in 6 months, call for any changes

## 2018-09-20 NOTE — Progress Notes (Signed)
NEUROLOGY CONSULTATION NOTE  Gregory [name] MRN: [id number] DOB: 01-Oct-1961  Referring provider: Dr. Karle Plumber Primary care provider: Dr. Karle Plumber  Reason for consult:  Seizure-like activity  Dear Dr Wynetta Emery:  Thank you for your kind referral of Gregory [name] for consultation of the above symptoms. Although his history is well known to you, please allow me to reiterate it for the purpose of our medical record. The patient was accompanied to the clinic by his son who also provides collateral information. Records and images were personally reviewed where available.  HISTORY OF PRESENT ILLNESS: This is a 57 year old right-handed man with a history of iron deficiency anemia, hypertension, CHF, CAD, s/p ICD placement, presenting after 2 episodes of unresponsiveness last 06/07/18. He reports that he had been lowering BP medication due to low BP and was feeling tired and dizzy. His last recollection was sitting in the car with his friend, then has no further recollection of events until he was in the ER. Notes reviewed, his friend noted he was staring straight ahead, unresponsive, then "he started talking like he was on a game show." He was back to baseline on arrival to the ER. While in the ER, nursing staff witnessed him to be staring off into space for around 20 seconds then had generalized tonic-clonic seizure-like activity that lasted for about a minute. He had slow sonorous breathing after and was then confused, gradually coming around. It was reported that he was hypotensive on the scene. BP on arrival to the ER was 103/62. He was significantly anemic, Hgb 6.9. I personally reviewed head CT without contrast which did not show any acute changes, there was mild chronic microvascular disease. His wake and drowsy EEG was normal. Echocardiogram showed an EF of 50-09%, grade 2 diastolic dysfunction, left atrium severely dilated. Since this was the first event, and  possibly provoked (hypoperfusion), seizure medication was not started. He received a blood transfusion and states that he feels "perfect" since then. He and his son deny any other staring/unresponsive episodes, gaps in time, olfactory/gustatory hallucinations, deja vu, rising epigastric sensation, focal numbness/tingling/weakness, myoclonic jerks.He denies any headaches, diplopia, dysarthria/dysphagia, neck/back pain, bowel/bladder dysfunction. He had a normal birth and early development.  There is no history of febrile convulsions, CNS infections such as meningitis/encephalitis, significant traumatic brain injury, neurosurgical procedures, or family history of seizures.  PAST MEDICAL HISTORY: Past Medical History:  Diagnosis Date  . CAD (coronary artery disease) 12/01/2013  . Chronic combined systolic and diastolic CHF, NYHA class 1 ([place]) 12/01/2013  . Erectile dysfunction 12/01/2013  . HTN (hypertension) 12/01/2013  . Hyperlipidemia 12/01/2013  . Ischemic cardiomyopathy 12/01/2013  . Sleep apnea     PAST SURGICAL HISTORY: Past Surgical History:  Procedure Laterality Date  . ACHILLES TENDON REPAIR     lft foot  . CARDIAC CATHETERIZATION N/A 05/05/2016   Procedure: Left Heart Cath and Coronary Angiography;  Surgeon: Leonie Man, MD;  Location: [facility];  Service: Cardiovascular;  Laterality: N/A;  . CARDIAC DEFIBRILLATOR PLACEMENT    . EP IMPLANTABLE DEVICE N/A 12/19/2016   Procedure: ICD Generator Changeout;  Surgeon: [name], MD;  Location: [facility];  Service: Cardiovascular;  Laterality: N/A;  . WRIST TENODESIS      MEDICATIONS: Current Outpatient Medications on File Prior to Visit  Medication Sig Dispense Refill  . amiodarone (PACERONE) 200 MG tablet Take 1 tablet (200 mg total) by mouth daily. 90 tablet 3  . aspirin  81 MG tablet Take 81 mg by mouth daily.    Marland Kitchen atorvastatin (LIPITOR) 80 MG tablet TAKE ONE TABLET BY MOUTH DAILY IN THE EVENING 30 tablet  11  . clopidogrel (PLAVIX) 75 MG tablet Take 1 tablet (75 mg total) by mouth daily. 90 tablet 2  . COREG CR 80 MG 24 hr capsule TAKE 1 CAPSULE(80 MG) BY MOUTH DAILY 30 capsule 0  . ferrous sulfate 325 (65 FE) MG tablet Take 1 tablet (325 mg total) by mouth daily with breakfast. 30 tablet 0  . hydrochlorothiazide (HYDRODIURIL) 25 MG tablet TAKE 1 TABLET BY MOUTH EVERY DAY 90 tablet 1  . pantoprazole (PROTONIX) 40 MG tablet Take 1 tablet (40 mg total) daily by mouth. 30 tablet 11  . polyethylene glycol (MIRALAX / GLYCOLAX) packet Take 17 g by mouth daily as needed for moderate constipation or severe constipation. 14 each 1  . potassium chloride (K-DUR,KLOR-CON) 10 MEQ tablet TAKE 1 TABLET BY MOUTH EVERY DAY 90 tablet 3  . sacubitril-valsartan (ENTRESTO) 97-103 MG Take 1 tablet by mouth 2 (two) times daily. 60 tablet 11  . sildenafil (VIAGRA) 50 MG tablet TAKE ONE TABLET BY MOUTH DAILY AS NEEDED FOR ERECTILE DYSFUNCTION 30 tablet 0  . sildenafil (VIAGRA) 50 MG tablet PLEASE SCHEDULE AN APPT FOR FUTURE REFILLS 30 tablet 1   No current facility-administered medications on file prior to visit.     ALLERGIES: No Known Allergies  FAMILY HISTORY: Family History  Problem Relation Age of Onset  . Heart disease Mother   . Brain cancer Father   . Hypertension Daughter     SOCIAL HISTORY: Social History   Socioeconomic History  . Marital status: Single    Spouse name: Not on file  . Number of children: Not on file  . Years of education: Not on file  . Highest education level: Not on file  Occupational History  . Not on file  Social Needs  . Financial resource strain: Not on file  . Food insecurity:    Worry: Not on file    Inability: Not on file  . Transportation needs:    Medical: Not on file    Non-medical: Not on file  Tobacco Use  . Smoking status: Former Smoker    Packs/day: 0.00    Years: 30.00    Pack years: 0.00    Types: Cigarettes    Last attempt to quit: 05/31/2013     Years since quitting: 5.3  . Smokeless tobacco: Never Used  Substance and Sexual Activity  . Alcohol use: No    Comment: ocassionally  . Drug use: Never  . Sexual activity: Not Currently  Lifestyle  . Physical activity:    Days per week: Not on file    Minutes per session: Not on file  . Stress: Not on file  Relationships  . Social connections:    Talks on phone: Not on file    Gets together: Not on file    Attends religious service: Not on file    Active member of club or organization: Not on file    Attends meetings of clubs or organizations: Not on file    Relationship status: Not on file  . Intimate partner violence:    Fear of current or ex partner: Not on file    Emotionally abused: Not on file    Physically abused: Not on file    Forced sexual activity: Not on file  Other Topics Concern  . Not on file  Social History Narrative  . Not on file    REVIEW OF SYSTEMS: Constitutional: No fevers, chills, or sweats, no generalized fatigue, change in appetite Eyes: No visual changes, double vision, eye pain Ear, nose and throat: No hearing loss, ear pain, nasal congestion, sore throat Cardiovascular: No chest pain, palpitations Respiratory:  No shortness of breath at rest or with exertion, wheezes GastrointestinaI: No nausea, vomiting, diarrhea, abdominal pain, fecal incontinence Genitourinary:  No dysuria, urinary retention or frequency Musculoskeletal:  No neck pain, back pain Integumentary: No rash, pruritus, skin lesions Neurological: as above Psychiatric: No depression, insomnia, anxiety Endocrine: No palpitations, fatigue, diaphoresis, mood swings, change in appetite, change in weight, increased thirst Hematologic/Lymphatic:  No anemia, purpura, petechiae. Allergic/Immunologic: no itchy/runny eyes, nasal congestion, recent allergic reactions, rashes  PHYSICAL EXAM: Vitals:   09/20/18 1422  BP: 110/72  Pulse: 76  SpO2: 96%   General: No acute distress Head:   Normocephalic/atraumatic Eyes: Fundoscopic exam shows bilateral sharp discs, no vessel changes, exudates, or hemorrhages Neck: supple, no paraspinal tenderness, full range of motion Back: No paraspinal tenderness Heart: regular rate and rhythm Lungs: Clear to auscultation bilaterally. Vascular: No carotid bruits. Skin/Extremities: No rash, no edema Neurological Exam: Mental status: alert and oriented to person, place, and time, no dysarthria or aphasia, Fund of knowledge is appropriate.  Recent and remote memory are intact.2/3 delayed recall. Attention and concentration are normal.    Able to name objects and repeat phrases. Cranial nerves: CN I: not tested CN II: pupils equal, round and reactive to light, visual fields intact, fundi unremarkable. CN III, IV, VI:  full range of motion, no nystagmus, no ptosis CN V: facial sensation intact CN VII: upper and lower face symmetric CN VIII: hearing intact to finger rub CN IX, X: gag intact, uvula midline CN XI: sternocleidomastoid and trapezius muscles intact CN XII: tongue midline Bulk & Tone: normal, no fasciculations. Motor: 5/5 throughout with no pronator drift. Sensation: intact to light touch, cold, pin, vibration and joint position sense.  No extinction to double simultaneous stimulation.  Romberg test negative Deep Tendon Reflexes: +1 throughout, no ankle clonus Plantar responses: downgoing bilaterally Cerebellar: no incoordination on finger to nose testing Gait: narrow-based and steady, mild difficulty with tandem walk but able Tremor: none  IMPRESSION: This is a 57 year old right-handed man with a history of  iron deficiency anemia, hypertension, CHF, CAD, s/p ICD placement, presenting after 2 episodes of unresponsiveness with staring last 06/07/18, the second episode witnessed in the ER was followed by convulsive activity. His neurological exam is normal, head CT and wake/drowsy EEG normal. He was significantly anemic and was  reported to be hypotensive during the first episode, however BP in the ER was not significantly low. Etiology unclear, considerations include seizure versus convulsive syncope. We discussed doing a 24-hour EEG to help further characterize the episodes. He has not had any similar episodes in June 2019. Continue with adequate hydration, monitor BP and CBC with PCP. We discussed [place] driving laws to stop driving after an episode of loss of consciousness/awareness, until 6 months event-free. He will follow-up in 6 months and knows to call for any changes.   Thank you for allowing me to participate in the care of this patient. Please do not hesitate to call for any questions or concerns.   Ellouise Newer, M.D.  CC: Dr. Wynetta Emery

## 2018-09-20 NOTE — Telephone Encounter (Signed)
Spoke with Gregory [name] to schedule 24 hour ambulatory EEG, offerd this Wednesday 09/22/18 @11 :00. He said too short notice. Offered October 23rd and he said he will call us back when he is ready to schedule.

## 2018-09-21 ENCOUNTER — Other Ambulatory Visit: Payer: Self-pay

## 2018-09-23 MED ORDER — SILDENAFIL CITRATE 50 MG PO TABS: 50.0000 mg | ORAL_TABLET | ORAL | 1 refills | Status: DC | PRN

## 2018-09-27 ENCOUNTER — Ambulatory Visit: Payer: Worker's Compensation | Admitting: Cardiovascular Disease

## 2018-09-27 ENCOUNTER — Other Ambulatory Visit: Payer: Self-pay | Admitting: Cardiovascular Disease

## 2018-09-28 ENCOUNTER — Ambulatory Visit (INDEPENDENT_AMBULATORY_CARE_PROVIDER_SITE_OTHER): Payer: Worker's Compensation | Admitting: *Deleted

## 2018-09-28 ENCOUNTER — Encounter: Payer: Self-pay | Admitting: *Deleted

## 2018-09-28 DIAGNOSIS — I255 Ischemic cardiomyopathy: Secondary | ICD-10-CM

## 2018-09-29 NOTE — Progress Notes (Signed)
Remote ICD transmission.   

## 2018-10-06 ENCOUNTER — Ambulatory Visit (INDEPENDENT_AMBULATORY_CARE_PROVIDER_SITE_OTHER): Payer: Self-pay | Admitting: Neurology

## 2018-10-06 DIAGNOSIS — R55 Syncope and collapse: Secondary | ICD-10-CM

## 2018-10-06 DIAGNOSIS — R404 Transient alteration of awareness: Secondary | ICD-10-CM

## 2018-10-15 ENCOUNTER — Other Ambulatory Visit: Payer: Self-pay | Admitting: Cardiovascular Disease

## 2018-10-18 ENCOUNTER — Telehealth: Payer: Self-pay

## 2018-10-18 ENCOUNTER — Other Ambulatory Visit: Payer: Self-pay | Admitting: Cardiovascular Disease

## 2018-10-18 NOTE — Telephone Encounter (Signed)
-----   Message from [name], MD sent at 10/18/2018  8:24 AM EST ----- Regarding: follow-up Can you pls ask him if he can come in tomorrow at 1 or 1:30pm to discuss his EEG results? You can let him the brain wave test was abnormal and I would like to speak to him about it. He has to have someone drive him to the appointment. Thanks

## 2018-10-18 NOTE — Telephone Encounter (Signed)
Spoke with pt.  Appointment made for Tuesday, November 5 @ 1PM

## 2018-10-19 ENCOUNTER — Ambulatory Visit (INDEPENDENT_AMBULATORY_CARE_PROVIDER_SITE_OTHER): Payer: PRIVATE HEALTH INSURANCE | Admitting: Neurology

## 2018-10-19 ENCOUNTER — Encounter: Payer: Self-pay | Admitting: Neurology

## 2018-10-19 VITALS — BP 108/68 | HR 70 | Ht 78.0 in | Wt 287.0 lb

## 2018-10-19 DIAGNOSIS — G40009 Localization-related (focal) (partial) idiopathic epilepsy and epileptic syndromes with seizures of localized onset, not intractable, without status epilepticus: Secondary | ICD-10-CM

## 2018-10-19 MED ORDER — LEVETIRACETAM 500 MG PO TABS: 500.0000 mg | ORAL_TABLET | Freq: Two times a day (BID) | ORAL | 11 refills | Status: DC

## 2018-10-19 NOTE — Patient Instructions (Addendum)
1. Start Keppra 500mg  twice a day 2. Follow-up in 3 months, call for any changes  Seizure Precautions: 1. If medication has been prescribed for you to prevent seizures, take it exactly as directed.  Do not stop taking the medicine without talking to your doctor first, even if you have not had a seizure in a long time.   2. Avoid activities in which a seizure would cause danger to yourself or to others.  Don't operate dangerous machinery, swim alone, or climb in high or dangerous places, such as on ladders, roofs, or girders.  Do not drive unless your doctor says you may.  3. If you have any warning that you may have a seizure, lay down in a safe place where you can't hurt yourself.    4.  No driving for 6 months from last seizure, as per [facility].   Please refer to the following link on the [place] website for more information: http://[email or web address]/[email or web address]/driving/drivingu.cfm   5.  Maintain good sleep hygiene. Avoid alcohol.  6.  Contact your doctor if you have any problems that may be related to the medicine you are taking.  7.  Call 911 and bring the patient back to the ED if:        A.  The seizure lasts longer than 5 minutes.       B.  The patient doesn't awaken shortly after the seizure  C.  The patient has new problems such as difficulty seeing, speaking or moving  D.  The patient was injured during the seizure  E.  The patient has a temperature over 102 F (39C)  F.  The patient vomited and now is having trouble breathing

## 2018-10-19 NOTE — Progress Notes (Signed)
NEUROLOGY FOLLOW UP OFFICE NOTE  Gregory [name] [id number] Nov 04, 1951  HISTORY OF PRESENT ILLNESS: I had the pleasure of seeing Gregory [name] in follow-up in the neurology clinic on 10/19/2018.  The patient was last seen a month ago after he had 2 episodes of unresponsiveness last 06/07/18. Head CT no acute changes, routine EEG normal. His 24-hour EEG was abnormal, he presents today to discuss results. He lives with his son and denies being told of any staring/unresponsive episodes. He denies any gaps in time, olfactory/gustatory hallucinations, focal numbness/tingling/weakness, myoclonic jerks. No headaches, dizziness, diplopia, no falls. He reports feeling very well. We discussed EEG findings showing 2 electrographic seizures arising from the right temporal region, the first lasted 50 seconds, the second lasted almost 2 minutes. Baseline EEG showed occasional focal slowing and rare sharp waves over the right temporal region. He denies feeling out of the ordinary during those time periods. He has not been driving, his mother brought him to the office today.  History on Initial Assessment 09/20/2018: This is a 57 year old right-handed man with a history of iron deficiency anemia, hypertension, CHF, CAD, s/p ICD placement, presenting after 2 episodes of unresponsiveness last 06/07/18. He reports that he had been lowering BP medication due to low BP and was feeling tired and dizzy. His last recollection was sitting in the car with his friend, then has no further recollection of events until he was in the ER. Notes reviewed, his friend noted he was staring straight ahead, unresponsive, then "he started talking like he was on a game show." He was back to baseline on arrival to the ER. While in the ER, nursing staff witnessed him to be staring off into space for around 20 seconds then had generalized tonic-clonic seizure-like activity that lasted for about a minute. He had slow sonorous breathing  after and was then confused, gradually coming around. It was reported that he was hypotensive on the scene. BP on arrival to the ER was 103/62. He was significantly anemic, Hgb 6.9. I personally reviewed head CT without contrast which did not show any acute changes, there was mild chronic microvascular disease. His wake and drowsy EEG was normal. Echocardiogram showed an EF of 50-53%, grade 2 diastolic dysfunction, left atrium severely dilated. Since this was the first event, and possibly provoked (hypoperfusion), seizure medication was not started. He received a blood transfusion and states that he feels "perfect" since then. He and his son deny any other staring/unresponsive episodes, gaps in time, olfactory/gustatory hallucinations, deja vu, rising epigastric sensation, focal numbness/tingling/weakness, myoclonic jerks.He denies any headaches, diplopia, dysarthria/dysphagia, neck/back pain, bowel/bladder dysfunction. He had a normal birth and early development.  There is no history of febrile convulsions, CNS infections such as meningitis/encephalitis, significant traumatic brain injury, neurosurgical procedures, or family history of seizures.  PAST MEDICAL HISTORY: Past Medical History:  Diagnosis Date  . CAD (coronary artery disease) 12/01/2013  . Chronic combined systolic and diastolic CHF, NYHA class 1 ([place]) 12/01/2013  . Erectile dysfunction 12/01/2013  . HTN (hypertension) 12/01/2013  . Hyperlipidemia 12/01/2013  . Ischemic cardiomyopathy 12/01/2013  . Sleep apnea     MEDICATIONS: Current Outpatient Medications on File Prior to Visit  Medication Sig Dispense Refill  . amiodarone (PACERONE) 200 MG tablet Take 1 tablet (200 mg total) by mouth daily. 90 tablet 3  . aspirin 81 MG tablet Take 81 mg by mouth daily.    Marland Kitchen atorvastatin (LIPITOR) 80 MG tablet TAKE ONE TABLET BY MOUTH DAILY IN THE  EVENING 30 tablet 11  . clopidogrel (PLAVIX) 75 MG tablet Take 1 tablet (75 mg total) by mouth daily.  90 tablet 2  . COREG CR 80 MG 24 hr capsule TAKE 1 CAPSULE(80 MG) BY MOUTH DAILY 30 capsule 9  . ferrous sulfate 325 (65 FE) MG tablet Take 1 tablet (325 mg total) by mouth daily with breakfast. 30 tablet 0  . hydrochlorothiazide (HYDRODIURIL) 25 MG tablet TAKE 1 TABLET BY MOUTH EVERY DAY 90 tablet 2  . pantoprazole (PROTONIX) 40 MG tablet Take 1 tablet (40 mg total) daily by mouth. 30 tablet 11  . polyethylene glycol (MIRALAX / GLYCOLAX) packet Take 17 g by mouth daily as needed for moderate constipation or severe constipation. 14 each 1  . potassium chloride (K-DUR,KLOR-CON) 10 MEQ tablet TAKE 1 TABLET BY MOUTH EVERY DAY 90 tablet 3  . sacubitril-valsartan (ENTRESTO) 97-103 MG Take 1 tablet by mouth 2 (two) times daily. 60 tablet 11  . sildenafil (VIAGRA) 50 MG tablet PLEASE SCHEDULE AN APPT FOR FUTURE REFILLS 30 tablet 1   No current facility-administered medications on file prior to visit.     ALLERGIES: No Known Allergies  FAMILY HISTORY: Family History  Problem Relation Age of Onset  . Heart disease Mother   . Brain cancer Father   . Hypertension Daughter     SOCIAL HISTORY: Social History   Socioeconomic History  . Marital status: Single    Spouse name: Not on file  . Number of children: Not on file  . Years of education: Not on file  . Highest education level: Not on file  Occupational History  . Not on file  Social Needs  . Financial resource strain: Not on file  . Food insecurity:    Worry: Not on file    Inability: Not on file  . Transportation needs:    Medical: Not on file    Non-medical: Not on file  Tobacco Use  . Smoking status: Former Smoker    Packs/day: 0.00    Years: 30.00    Pack years: 0.00    Types: Cigarettes    Last attempt to quit: 05/31/2013    Years since quitting: 5.3  . Smokeless tobacco: Never Used  Substance and Sexual Activity  . Alcohol use: No    Comment: ocassionally  . Drug use: Never  . Sexual activity: Not Currently    Lifestyle  . Physical activity:    Days per week: Not on file    Minutes per session: Not on file  . Stress: Not on file  Relationships  . Social connections:    Talks on phone: Not on file    Gets together: Not on file    Attends religious service: Not on file    Active member of club or organization: Not on file    Attends meetings of clubs or organizations: Not on file    Relationship status: Not on file  . Intimate partner violence:    Fear of current or ex partner: Not on file    Emotionally abused: Not on file    Physically abused: Not on file    Forced sexual activity: Not on file  Other Topics Concern  . Not on file  Social History Narrative   Pt lies in split level home with his son, daughter and grand-son   Has 3 children   Some college education   Retired Scientist, forensic.     REVIEW OF SYSTEMS: Constitutional: No fevers, chills,  or sweats, no generalized fatigue, change in appetite Eyes: No visual changes, double vision, eye pain Ear, nose and throat: No hearing loss, ear pain, nasal congestion, sore throat Cardiovascular: No chest pain, palpitations Respiratory:  No shortness of breath at rest or with exertion, wheezes GastrointestinaI: No nausea, vomiting, diarrhea, abdominal pain, fecal incontinence Genitourinary:  No dysuria, urinary retention or frequency Musculoskeletal:  No neck pain, back pain Integumentary: No rash, pruritus, skin lesions Neurological: as above Psychiatric: No depression, insomnia, anxiety Endocrine: No palpitations, fatigue, diaphoresis, mood swings, change in appetite, change in weight, increased thirst Hematologic/Lymphatic:  No anemia, purpura, petechiae. Allergic/Immunologic: no itchy/runny eyes, nasal congestion, recent allergic reactions, rashes  PHYSICAL EXAM: Vitals:   10/19/18 1301  BP: 108/68  Pulse: 70  SpO2: 93%   General: No acute distress Head:  Normocephalic/atraumatic Neck: supple, no paraspinal  tenderness, full range of motion Heart:  Regular rate and rhythm Lungs:  Clear to auscultation bilaterally Back: No paraspinal tenderness Skin/Extremities: No rash, no edema Neurological Exam: alert and oriented to person, place, and time. No aphasia or dysarthria. Fund of knowledge is appropriate.  Recent and remote memory are intact.  Attention and concentration are normal.    Able to name objects and repeat phrases. Cranial nerves: Pupils equal, round, reactive to light. Extraocular movements intact with no nystagmus. Visual fields full. Facial sensation intact. No facial asymmetry. Tongue, uvula, palate midline.  Motor: Bulk and tone normal, muscle strength 5/5 throughout with no pronator drift.  Sensation to light touch intact.  No extinction to double simultaneous stimulation. Finger to nose testing intact.  Gait narrow-based and steady, able to tandem walk adequately.  Romberg negative.  IMPRESSION: This is a 57 year old right-handed man with a history of  iron deficiency anemia, hypertension, CHF, CAD, s/p ICD placement, presenting after 2 episodes of unresponsiveness with staring last 06/07/18, the second episode witnessed in the ER was followed by convulsive activity. His neurological exam is normal, head CT and wake/drowsy EEG normal. Unable to do MRI due to ICD. His 24-hour EEG was abnormal showing occasional right temporal slowing and capturing 2 electrographic seizures arising from the right temporal region. He denies any symptoms or being told of any unresponsive episodes. 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 He is agreeable to starting Keppra 500mg  BID, side effects were discussed, we may uptitrate as needed. He is aware of [place] driving laws to stop driving after an episode of loss of consciousness/awareness, until 6 months event-free. He will follow-up in 3 months and knows to call for any changes.   Thank you for allowing me to  participate in his care.  Please do not hesitate to call for any questions or concerns.  The duration of this appointment visit was 20 minutes of face-to-face time with the patient.  Greater than 50% of this time was spent in counseling, explanation of diagnosis, planning of further management, and coordination of care.   Ellouise Newer, M.D.   CC: Dr. Caryl Comes

## 2018-10-20 ENCOUNTER — Encounter: Payer: Self-pay | Admitting: Neurology

## 2018-10-20 DIAGNOSIS — G40009 Localization-related (focal) (partial) idiopathic epilepsy and epileptic syndromes with seizures of localized onset, not intractable, without status epilepticus: Secondary | ICD-10-CM | POA: Insufficient documentation

## 2018-10-22 LAB — CUP PACEART REMOTE DEVICE CHECK
Brady Statistic AP VP Percent: 1.33 %
Brady Statistic AP VS Percent: 61.71 %
Brady Statistic AS VP Percent: 0.2 %
Brady Statistic RV Percent Paced: 1.63 %
HIGH POWER IMPEDANCE MEASURED VALUE: 49 Ohm
HIGH POWER IMPEDANCE MEASURED VALUE: 64 Ohm
Implantable Lead Implant Date: 20101117
Implantable Lead Location: 753860
Implantable Lead Model: 5076
Implantable Lead Model: 7121
Implantable Pulse Generator Implant Date: 20180105
Lead Channel Impedance Value: 418 Ohm
Lead Channel Pacing Threshold Amplitude: 0.625 V
Lead Channel Pacing Threshold Amplitude: 1 V
Lead Channel Pacing Threshold Pulse Width: 0.4 ms
Lead Channel Sensing Intrinsic Amplitude: 18.625 mV
Lead Channel Sensing Intrinsic Amplitude: 18.625 mV
Lead Channel Sensing Intrinsic Amplitude: 4 mV
Lead Channel Setting Pacing Amplitude: 1.5 V
Lead Channel Setting Pacing Amplitude: 2 V
MDC IDC LEAD IMPLANT DT: 20051120
MDC IDC LEAD LOCATION: 753859
MDC IDC MSMT BATTERY REMAINING LONGEVITY: 109 mo
MDC IDC MSMT BATTERY VOLTAGE: 2.94 V
MDC IDC MSMT LEADCHNL RA PACING THRESHOLD PULSEWIDTH: 0.4 ms
MDC IDC MSMT LEADCHNL RA SENSING INTR AMPL: 4 mV
MDC IDC MSMT LEADCHNL RV IMPEDANCE VALUE: 342 Ohm
MDC IDC MSMT LEADCHNL RV IMPEDANCE VALUE: 456 Ohm
MDC IDC SESS DTM: 20191015041702
MDC IDC SET LEADCHNL RV PACING PULSEWIDTH: 0.4 ms
MDC IDC SET LEADCHNL RV SENSING SENSITIVITY: 0.6 mV
MDC IDC STAT BRADY AS VS PERCENT: 36.76 %
MDC IDC STAT BRADY RA PERCENT PACED: 62.53 %

## 2018-10-25 ENCOUNTER — Telehealth: Payer: Self-pay | Admitting: *Deleted

## 2018-10-25 NOTE — Telephone Encounter (Signed)
Mr. Dady calling back- he feels fine, I made him aware of episodes overnight, he will send another transmission now. Transmission received SR with PVCs VT terminated with shocks (2 shocks total, ATP unsuccessful). Mr. Bencomo said that he missed 2 days of amiodarone due to pharmacy mix up, he is back on it since Sunday. He is compliant with coreg and entresto. He made me aware of a seizue 06/07/18, his BP was low and he was anemic. He has followed up with Dr. Delice Lesch and she cc'd the note to Dr. Caryl Comes. I will review with Dr. Caryl Comes 10/26/18.  I made Mr Thivierge aware of [place] driving restrictions- he is aware due to recent seizure.

## 2018-10-25 NOTE — Telephone Encounter (Signed)
LMOM to return call to [place] episodes overnight with ATP/shock 10/25/18, transmission received 10/25/18 12:11am. Presenting rhythm: VT @ 150bpm.

## 2018-10-26 ENCOUNTER — Other Ambulatory Visit: Payer: Self-pay | Admitting: Internal Medicine

## 2018-10-27 MED ORDER — AMIODARONE HCL 200 MG PO TABS: 200.0000 mg | ORAL_TABLET | Freq: Two times a day (BID) | ORAL | 3 refills | Status: DC

## 2018-10-27 NOTE — Telephone Encounter (Signed)
Reviewed episode with Dr. Caryl Comes 10/26/18 at [facility]. He gave an order to increase amiodarone from 200mg  daily to 200mg  BID and to follow up with EP APP in 1-2 weeks.  Patient aware and agreeable- will send Rx to [facility] on [address].   Mr. Cho has an appt with Dr. Claiborne Billings tomorrow 10/28/18.  Will ask if this is sufficient for follow-up or if [facility] would prefer he follow up with EP.

## 2018-10-28 ENCOUNTER — Encounter: Payer: Self-pay | Admitting: Cardiovascular Disease

## 2018-10-28 ENCOUNTER — Ambulatory Visit (INDEPENDENT_AMBULATORY_CARE_PROVIDER_SITE_OTHER): Payer: Worker's Compensation | Admitting: Cardiovascular Disease

## 2018-10-28 VITALS — BP 128/80 | HR 48 | Ht 78.0 in | Wt 289.0 lb

## 2018-10-28 DIAGNOSIS — I472 Ventricular tachycardia, unspecified: Secondary | ICD-10-CM

## 2018-10-28 DIAGNOSIS — I5022 Chronic systolic (congestive) heart failure: Secondary | ICD-10-CM

## 2018-10-28 DIAGNOSIS — I255 Ischemic cardiomyopathy: Secondary | ICD-10-CM | POA: Diagnosis not present

## 2018-10-28 DIAGNOSIS — G4733 Obstructive sleep apnea (adult) (pediatric): Secondary | ICD-10-CM | POA: Diagnosis not present

## 2018-10-28 DIAGNOSIS — Z95810 Presence of automatic (implantable) cardiac defibrillator: Secondary | ICD-10-CM

## 2018-10-28 DIAGNOSIS — Z9989 Dependence on other enabling machines and devices: Secondary | ICD-10-CM

## 2018-10-28 DIAGNOSIS — I10 Essential (primary) hypertension: Secondary | ICD-10-CM

## 2018-10-28 NOTE — Patient Instructions (Signed)
Follow-Up: At [facility], you and your health needs are our priority.  As part of our continuing mission to provide you with exceptional heart care, we have created designated Provider Care Teams.  These Care Teams include your primary Cardiologist (physician) and Advanced Practice Providers (APPs -  Physician Assistants and Nurse Practitioners) who all work together to provide you with the care you need, when you need it. 3 months with Dr. Claiborne Billings (sleep clinic)  Any Other Special Instructions Will Be Listed Below (If Applicable). We will resend orders to [facility] # [phone]

## 2018-10-28 NOTE — Progress Notes (Signed)
Cardiology Office Note    Date:  10/29/2018   ID:  Gregory [name], DOB Aug 30, 1961, MRN [id number]  PCP:  Patient, No Pcp Per  Cardiologist:  Shelva Majestic, MD (sleep); Dr. Sallyanne Kuster  No chief complaint on file.   History of Present Illness:  Gregory [name] is a 57 y.o. male who presents for a 16 month follow-up sleep evaluation.  He is followed by Dr. Sallyanne Kuster and Dr. Caryl Comes.  Gregory [name] is originally from [place] and was diagnosed with obstructive sleep apnea over 13 years ago.  During hospitalization at that time, he had sustained VT and ultimately underwent ICD implantation in the context of congestive heart failure and an ischemic cardiomyopathy.  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  He had had recurrent monomorphic ventricular tachycardia and his previous documentation of inferolateral scar.  He underwent repeat catheterization in May 2017 which showed patent stents in the OM1, diagonal and mid LAD, but he had known RCA occlusion with retrograde filling of the PDA and a lateral branch of his marginal artery was subtotally occluded and there was high-grade disease in the PLA, neither of which was amenable to revascularization.In May 2017.  A follow-up echo Doppler study showed an EF at 60-65%.  He underwent ICD generator replacement in January 2018.  When he was originally diagnosed with sleep apnea in 2006 his sleep apnea was very severe.  I saw him for initial sleep evaluation in April 2018 and at that time he admitted to 100% compliance and has an old RemStar Respironics CPAP machine.  He brought the machine with him to the office and on interrogation in April 2018 CPAP was set at 12 cm water pressure.  He was at [phone], hours of use.   He uses a nasal mask.  His machine was starting to make some noise and beginning to show signs of malfunction.  Previously, he was very symptomatic with  nonrestorative sleep, frequent awakenings, loud snoring.  He was going to bed at 1 AM, wakes up  7:30 AM.  He cannot sleep without his machine.  He is unaware of bruxism.  He denies any awareness of breakthrough snoring.  As long as he uses CPAP he does not have daytime sleepiness.  He is unaware of restless legs, hypnogognic hallucinations, no cataplexy.  There has been weight gain over the years.  At the time of his initial evaluation with me, while using his machine, he did not have significant excessive residual daytime sleepiness and shown below.   Epworth Sleepiness Scale: Situation   Chance of Dozing/Sleeping (0 = never , 1 = slight chance , 2 = moderate chance , 3 = high chance )   sitting and reading 0   watching TV 1   sitting inactive in a public place 0   being a passenger in a motor vehicle for an hour or more 0   lying down in the afternoon 3   sitting and talking to someone 0   sitting quietly after lunch (no alcohol) 0   while stopped for a few minutes in traffic as the driver 0   Total Score  4   He underwent a follow-up sleep study on 06/07/2017. This reconfirmed severe sleep apnea with an AHI of 37.2, RDI of 45.6.  His  events were worse with supine sleep with an HI 58.4, and very severe during REM sleep with an AHI  of 100.  Unfortunately, he had very poor sleep efficiency at only 22%.  As result, CPAP titration was not able to be performed.  When I saw him in follow-up in July 2018 he admitted to being sleepy and a repeat Epworth scale endorsed at 72.   I recommended that he undergo a CPAP with possible BiPAP titration if necessary for further assessment  which was done on August 05, 2017.  He was titrated up to an optimal pressure of 16 cm water pressure.  AHI at 16 cm was 1.9/h.  Oxygen nadir was 92%.  Apparently, there has been significant difficulty in him getting his additional CPAP machine.  This has been due to insurance issues.  Over this time.,  He continues to use  CPAP with 100% compliance.  He cannot sleep without it, and that oftentimes he uses it even when he takes naps during the day.  He brought his old REMstar plus machine with him to the office today.  Interrogation again reveals this set at 12 cm and usage is now 42,304 hours. Of one calculates usage since 2006, this equates to about 8 hours of CPAP use per night on a daily basis with 100% compliance.   Past Medical History:  Diagnosis Date  . CAD (coronary artery disease) 12/01/2013  . Chronic combined systolic and diastolic CHF, NYHA class 1 ([place]) 12/01/2013  . Erectile dysfunction 12/01/2013  . HTN (hypertension) 12/01/2013  . Hyperlipidemia 12/01/2013  . Ischemic cardiomyopathy 12/01/2013  . Sleep apnea     Past Surgical History:  Procedure Laterality Date  . ACHILLES TENDON REPAIR     lft foot  . CARDIAC CATHETERIZATION N/A 05/05/2016   Procedure: Left Heart Cath and Coronary Angiography;  Surgeon: Leonie Man, MD;  Location: [facility];  Service: Cardiovascular;  Laterality: N/A;  . CARDIAC DEFIBRILLATOR PLACEMENT    . EP IMPLANTABLE DEVICE N/A 12/19/2016   Procedure: ICD Generator Changeout;  Surgeon: [name], MD;  Location: [facility];  Service: Cardiovascular;  Laterality: N/A;  . WRIST TENODESIS      Current Medications: Outpatient Medications Prior to Visit  Medication Sig Dispense Refill  . amiodarone (PACERONE) 200 MG tablet Take 1 tablet (200 mg total) by mouth 2 (two) times daily. 60 tablet 3  . aspirin 81 MG tablet Take 81 mg by mouth daily.    Marland Kitchen atorvastatin (LIPITOR) 80 MG tablet TAKE ONE TABLET BY MOUTH DAILY IN THE EVENING 30 tablet 11  . clopidogrel (PLAVIX) 75 MG tablet Take 1 tablet (75 mg total) by mouth daily. 90 tablet 2  . COREG CR 80 MG 24 hr capsule TAKE 1 CAPSULE(80 MG) BY MOUTH DAILY 30 capsule 9  . FEROSUL 325 (65 Fe) MG tablet TAKE 1 TABLET BY MOUTH DAILY WITH BREAKFAST 60 tablet 1  . hydrochlorothiazide (HYDRODIURIL) 25 MG  tablet TAKE 1 TABLET BY MOUTH EVERY DAY 90 tablet 2  . levETIRAcetam (KEPPRA) 500 MG tablet Take 1 tablet (500 mg total) by mouth 2 (two) times daily. 60 tablet 11  . pantoprazole (PROTONIX) 40 MG tablet Take 1 tablet (40 mg total) daily by mouth. 30 tablet 11  . polyethylene glycol (MIRALAX / GLYCOLAX) packet Take 17 g by mouth daily as needed for moderate constipation or severe constipation. 14 each 1  . potassium chloride (K-DUR,KLOR-CON) 10 MEQ tablet TAKE 1 TABLET BY MOUTH EVERY DAY 90 tablet 3  . sacubitril-valsartan (ENTRESTO) 97-103 MG Take 1 tablet by mouth 2 (two)  times daily. 60 tablet 11  . sildenafil (VIAGRA) 50 MG tablet PLEASE SCHEDULE AN APPT FOR FUTURE REFILLS 30 tablet 1   No facility-administered medications prior to visit.      Allergies:   Patient has no known allergies.   Social History   Socioeconomic History  . Marital status: Single    Spouse name: Not on file  . Number of children: Not on file  . Years of education: Not on file  . Highest education level: Not on file  Occupational History  . Not on file  Social Needs  . Financial resource strain: Not on file  . Food insecurity:    Worry: Not on file    Inability: Not on file  . Transportation needs:    Medical: Not on file    Non-medical: Not on file  Tobacco Use  . Smoking status: Former Smoker    Packs/day: 0.00    Years: 30.00    Pack years: 0.00    Types: Cigarettes    Last attempt to quit: 05/31/2013    Years since quitting: 5.4  . Smokeless tobacco: Never Used  Substance and Sexual Activity  . Alcohol use: No    Comment: ocassionally  . Drug use: Never  . Sexual activity: Not Currently  Lifestyle  . Physical activity:    Days per week: Not on file    Minutes per session: Not on file  . Stress: Not on file  Relationships  . Social connections:    Talks on phone: Not on file    Gets together: Not on file    Attends religious service: Not on file    Active member of club or  organization: Not on file    Attends meetings of clubs or organizations: Not on file    Relationship status: Not on file  Other Topics Concern  . Not on file  Social History Narrative   Pt lies in split level home with his son, daughter and grand-son   Has 3 children   Some college education   Retired Scientist, forensic.     Additional social history is notable in that he has 3 children and 2 grandchildren.  Family History:  The patient's family history includes Brain cancer in his father; Heart disease in his mother; Hypertension in his daughter.   ROS General: Negative; No fevers, chills, or night sweats;  HEENT: Negative; No changes in vision or hearing, sinus congestion, difficulty swallowing Pulmonary: Negative; No cough, wheezing, shortness of breath, hemoptysis Cardiovascular: ischemic cardiomyopathy; h/o VT; s/p ICD GI: Negative; No nausea, vomiting, diarrhea, or abdominal pain GU: Negative; No dysuria, hematuria, or difficulty voiding Musculoskeletal: Negative; no myalgias, joint pain, or weakness Hematologic/Oncology: Negative; no easy bruising, bleeding Endocrine: Negative; no heat/cold intolerance; no diabetes Neuro: Negative; no changes in balance, headaches Skin: Negative; No rashes or skin lesions Psychiatric: Negative; No behavioral problems, depression Sleep: see HPI Other comprehensive 14 point system review is negative.   PHYSICAL EXAM:   VS:  BP 128/80 (BP Location: Right Arm)   Pulse (!) 48   Ht '6\' 6"'  (1.981 m)   Wt 289 lb (131.1 kg)   SpO2 98%   BMI 33.40 kg/m     Repeat blood pressure by me was 138/70  Wt Readings from Last 3 Encounters:  10/28/18 289 lb (131.1 kg)  10/19/18 287 lb (130.2 kg)  09/20/18 283 lb (128.4 kg)    General: Alert, oriented, no distress.  Skin: normal turgor, no  rashes, warm and dry HEENT: Normocephalic, atraumatic. Pupils equal round and reactive to light; sclera anicteric; extraocular muscles intact;  Nose  without nasal septal hypertrophy Mouth/Parynx benign; Mallinpatti scale 4 Neck: No JVD, no carotid bruits; normal carotid upstroke Lungs: clear to ausculatation and percussion; no wheezing or rales Chest wall: without tenderness to palpitation Heart: PMI not displaced, RRR, s1 s2 normal, 1/6 systolic murmur, no diastolic murmur, no rubs, gallops, thrills, or heaves Abdomen: soft, nontender; no hepatosplenomehaly, BS+; abdominal aorta nontender and not dilated by palpation. Back: no CVA tenderness Pulses 2+ Musculoskeletal: full range of motion, normal strength, no joint deformities Extremities: no clubbing cyanosis or edema, Homan's sign negative  Neurologic: grossly nonfocal; Cranial nerves grossly wnl Psychologic: Normal mood and affect   Studies/Labs Reviewed:   ECG (independently read by me): Normal sinus rhythm at 66 bpm.  Left axis deviation.  Q waves in inferiorly.  Poor anterior R wave progression with Q waves in lead V6.  May 2018 ECG (independently read by me): Sinus rhythm with first-degree AV block with PR interval of 224 ms.  Left axis deviation.  Inferior Q waves.  Nonspecific T changes in 1 and L.  Recent Labs: BMP Latest Ref Rng & Units 06/29/2018 06/09/2018 06/08/2018  Glucose 65 - 99 mg/dL 107(H) 86 115(H)  BUN 6 - 24 mg/dL 17 20 23(H)  Creatinine 0.76 - 1.27 mg/dL 1.08 1.17 1.27(H)  BUN/Creat Ratio 9 - 20 16 - -  Sodium 134 - 144 mmol/L 142 141 135  Potassium 3.5 - 5.2 mmol/L 4.0 4.2 3.4(L)  Chloride 96 - 106 mmol/L 103 105 102  CO2 20 - 29 mmol/L '24 29 26  ' Calcium 8.7 - 10.2 mg/dL 8.9 9.0 8.5(L)     Hepatic Function Latest Ref Rng & Units 06/08/2018 06/07/2018 10/15/2017  Total Protein 6.5 - 8.1 g/dL 6.5 7.2 6.6  Albumin 3.5 - 5.0 g/dL 3.3(L) 3.6 4.0  AST 15 - 41 U/L 14(L) 20 17  ALT 0 - 44 U/L '16 19 27  ' Alk Phosphatase 38 - 126 U/L 53 61 112  Total Bilirubin 0.3 - 1.2 mg/dL 1.9(H) 0.5 0.3  Bilirubin, Direct 0.00 - 0.40 mg/dL - - -    CBC Latest Ref Rng &  Units 06/29/2018 06/09/2018 06/08/2018  WBC 3.4 - 10.8 x10E3/uL 8.0 9.4 8.0  Hemoglobin 13.0 - 17.7 g/dL 11.4(L) 8.4(L) 7.5(L)  Hematocrit 37.5 - 51.0 % 38.2 29.5(L) 26.6(L)  Platelets 150 - 450 x10E3/uL 291 374 347   Lab Results  Component Value Date   MCV 84 06/29/2018   MCV 68.6 (L) 06/09/2018   MCV 67.5 (L) 06/08/2018   Lab Results  Component Value Date   TSH 1.408 06/08/2018   Lab Results  Component Value Date   HGBA1C 5.7 (H) 02/28/2014     BNP No results found for: BNP  ProBNP    Component Value Date/Time   PROBNP 80.0 10/29/2009 1944     Lipid Panel     Component Value Date/Time   CHOL 132 10/15/2017 1013   TRIG 57 10/15/2017 1013   HDL 34 (L) 10/15/2017 1013   CHOLHDL 3.9 10/15/2017 1013   CHOLHDL 4.2 05/05/2016 0526   VLDL 11 05/05/2016 0526   LDLCALC 87 10/15/2017 1013     RADIOLOGY: No results found.   Additional studies/ records that were reviewed today include:  I reviewed the patient's records from Dr. Caryl Comes and Dr. Sallyanne Kuster.  I reviewed his May 2017 cardiac catheterization results and echo Doppler study.  I reviewed his most recent sleep study in detail.   ASSESSMENT:    1. OSA on CPAP   2. Chronic systolic congestive heart failure ([place])   3. Essential hypertension   4. Ischemic cardiomyopathy   5. Ventricular tachycardia ([place])   6. ICD (implantable cardioverter-defibrillator) in place     PLAN:  Gregory [name] is a 57 year old African-American gentleman who has a history of an ischemic cardiomyopathy, ventricular tachycardia, and underwent initial ICD implantation in 2006 with subsequent upgrade in January 2018.  At the time of his initial presentation in 2006 in [place] he was evaluated for obstructive sleep apnea and was found to have severe OSA. He is been on CPAP therapy for 13 years duration duration and essentially has not slept without it.  He admits to 100% compliance.  I interrogated his machine today and he has had 42,304  hours of use.  Over the past 13 years, this equates to approximately 8 hours of CPAP use per night which verifies his compliance.  He was retested last year in anticipation of getting a new machine.  His sleep study reconfirmed severe sleep apnea with an AHI of 37.2, RDI 45.6 but AHI during REM sleep was 100 events per hour.  He was ultimately titrated up to 16 cm water pressure which was felt to be optimal.  Unfortunately, he has not yet been able to get his new machine.  Again have written him a new prescription for a ResMed air sense 10 auto CPAP unit.  On his current machine which is set at 12 and he has often taken naps during the day.  Epworth scale was recalculated in the office today and this endorsed at 13 consistent with residual daytime sleepiness.  I will set his new machine in an auto mode with a minimum of 12 up to a maximum of 20 cm which should allow good accommodation depending upon posture and stage of sleep.  He is under the Cardiologic care of Dr. Sallyanne Kuster and Dr. Caryl Comes.  He tells me he had a shock event last week and his amiodarone dose was increased by Dr. Caryl Comes to 200 mg twice a day.  We discussed the importance of weight loss and optimal sleep duration.  I will see him in sleep clinic within 90 days following his new machine acquisition per Medicare compliance requirements   Medication Adjustments/Labs and Tests Ordered: Current medicines are reviewed at length with the patient today.  Concerns regarding medicines are outlined above.  Medication changes, Labs and Tests ordered today are listed in the Patient Instructions below. Patient Instructions  Follow-Up: At [facility], you and your health needs are our priority.  As part of our continuing mission to provide you with exceptional heart care, we have created designated Provider Care Teams.  These Care Teams include your primary Cardiologist (physician) and Advanced Practice Providers (APPs -  Physician Assistants and Nurse  Practitioners) who all work together to provide you with the care you need, when you need it. 3 months with Dr. Claiborne Billings (sleep clinic)  Any Other Special Instructions Will Be Listed Below (If Applicable). We will resend orders to [facility] # [phone]      Signed, Shelva Majestic, MD  10/29/2018 4:15 PM    [facility] [address], [place], [place], [place]  [address] Phone: [phone]

## 2018-10-29 ENCOUNTER — Encounter: Payer: Self-pay | Admitting: Cardiovascular Disease

## 2018-11-03 NOTE — Telephone Encounter (Signed)
Terrence Dupont thanks  ( I will not be able to write this much in the future :((  Yes he should followup with EP as he is having ongoing vT

## 2018-11-06 ENCOUNTER — Other Ambulatory Visit: Payer: Self-pay | Admitting: Cardiology

## 2018-11-08 NOTE — Procedures (Signed)
ELECTROENCEPHALOGRAM REPORT  Dates of Recording: 10/06/2018 10:40AM to 10/07/2018 11:38AM  Patient's Name: Gregory [name] MRN: [id number] Date of Birth: [birth year]  Referring Provider: Dr. Ellouise Newer  Procedure: 24-hour ambulatory EEG  History: This is a 57 year old man with 2 episodes of staring/unresponsiveness followed by GTC in the ER.  Medications:  PACERONE 200 MG tablet  aspirin 81 MG tablet LIPITOR 80 MG tablet  PLAVIX 75 MG tablet  COREG CR 80 MG 24 hr capsule  65 FE MG tablet  HYDRODIURIL 25 MG tablet  PROTONIX 40 MG tablet  MIRALAX / GLYCOLAX packet  K-DUR,KLOR-CON 10 MEQ tablet  ENTRESTO 97-103 MG  VIAGRA 50 MG tablet    Technical Summary: This is a 24-hour multichannel digital EEG recording measured by the international 10-20 system with electrodes applied with paste and impedances below 5000 ohms performed as portable with EKG monitoring.  The digital EEG was referentially recorded, reformatted, and digitally filtered in a variety of bipolar and referential montages for optimal display.    DESCRIPTION OF RECORDING: During maximal wakefulness, the background activity consisted of a symmetric 9 Hz posterior dominant rhythm which was reactive to eye opening.  There were no epileptiform discharges or focal slowing seen in wakefulness.  During the recording, the patient progresses through wakefulness, drowsiness, and Stage 2 sleep. There is occasional focal 4-5 Hz theta slowing seen over the right frontotemporal region, at times sharply contoured. There were no clear epileptiform discharges seen.  Events: There were no push button events.  On 10/23 at 2207 hours, rhythmic 3 Hz activity is seen over the right temporal region evolving in frequency and amplitude for 50 seconds. He is sitting on a chair and looking to the left side. He denies symptoms and reports the TV is on his left side.  On 10/23 at 2419 hours, rhythmic 4-5 Hz activity is seen over the  right temporal region evolving in frequency and amplitude for 150 seconds. Patient is not on video and did not report any symptoms.  EKG lead was unremarkable.  IMPRESSION: This 24-hour ambulatory EEG study is abnormal due to the presence of: 1. Occasional focal slowing over the right frontotemporal region 2. Two electrographic seizures captured arising from the right temporal region lasting 50-150 seconds  CLINICAL CORRELATION of the above findings indicates definite epileptogenic potential over the right temporal region with 2 electrographic seizures captured. Focal slowing in this region indicates focal cerebral dysfunction suggestive of underlying structural or physiologic abnormality.    Ellouise Newer, M.D.

## 2018-11-18 ENCOUNTER — Encounter: Payer: Self-pay | Admitting: Internal Medicine

## 2018-11-18 ENCOUNTER — Ambulatory Visit (INDEPENDENT_AMBULATORY_CARE_PROVIDER_SITE_OTHER): Payer: Worker's Compensation | Admitting: Internal Medicine

## 2018-11-18 VITALS — BP 130/86 | HR 73 | Ht 78.0 in | Wt 291.4 lb

## 2018-11-18 DIAGNOSIS — I5042 Chronic combined systolic (congestive) and diastolic (congestive) heart failure: Secondary | ICD-10-CM

## 2018-11-18 DIAGNOSIS — I255 Ischemic cardiomyopathy: Secondary | ICD-10-CM | POA: Diagnosis not present

## 2018-11-18 DIAGNOSIS — I472 Ventricular tachycardia, unspecified: Secondary | ICD-10-CM

## 2018-11-18 DIAGNOSIS — Z79899 Other long term (current) drug therapy: Secondary | ICD-10-CM | POA: Diagnosis not present

## 2018-11-18 DIAGNOSIS — Z95810 Presence of automatic (implantable) cardiac defibrillator: Secondary | ICD-10-CM

## 2018-11-18 NOTE — Patient Instructions (Signed)
Medication Instructions:  Your physician recommends that you continue on your current medications as directed. Please refer to the Current Medication list given to you today.  **Continue your high dose Amiodarone, 200mg  tablet, two times per day for the next two weeks.  **Starting on Dec 20, begin taking 200mg  tablet, once daily  Labwork: You will have labs drawn today: CBC, BMP, LFT, and TSH  Testing/Procedures: None ordered.  Follow-Up: Your physician recommends that you schedule a follow-up appointment in:   6 months with Chanetta Marshall, NP   Any Other Special Instructions Will Be Listed Below (If Applicable).     If you need a refill on your cardiac medications before your next appointment, please call your pharmacy.

## 2018-11-18 NOTE — Progress Notes (Signed)
Patient Care Team: Patient, No Pcp Per as PCP - General (General Practice)   HPI  Gregory [name] is a 57 y.o. male Seen in followup for ventricular tachycardia associated with a previously implanted ICD in the context of congestive heart failure ischemic cardiomyopathy. Ejection fraction echo 2014--45-50% and Myoview 2010 extensive inferolateral scar. He had a type I have talked with  underwent ICD generator replacement 1/18  He presented 5/17 and  found to be in sustained monomorphic ventricular tachycardia rate of 160 below his detection rate. His device was reprogrammed to 150.  Cath 5/17  Cath Ost RCA to Dist RCA lesion, 100% stenosed. Known occluded. RPDA fills via collaterals with minimal filling of PL system 1. Post Atrio lesion, 80% stenosed. 2. Lat 1st Mrg lesion, 100% stenosed. Appears to be jailed from prior stent. Fills via collaterals 3. Widely patent stent in OM1, D1 and mid LAD 4. There is mild to moderate left ventricular systolic dysfunction. Severely elevated LVEDP  He's been managed by combination of amiodarone and mexiletine, the latter stopped 2/2 flatus  Tolerating higher doses of amio, no interval VT  Date TSH LFTs Cr K Hgb  5/17       3.3   9/17  2.24 21  5.1   7/19 1.408 16 1.08 4.0 11.4<<8.4          He developed recurrent ventricular tachycardia with multiple episodes of a day.  He tells me today that it was related temporally to acute alcohol intoxication secondary depression and passive suicidal ideation  siezures assoc with video games        Past Medical History:  Diagnosis Date  . CAD (coronary artery disease) 12/01/2013  . Chronic combined systolic and diastolic CHF, NYHA class 1 ([place]) 12/01/2013  . Erectile dysfunction 12/01/2013  . HTN (hypertension) 12/01/2013  . Hyperlipidemia 12/01/2013  . Ischemic cardiomyopathy 12/01/2013  . Sleep apnea     Past Surgical History:  Procedure Laterality Date  . ACHILLES TENDON  REPAIR     lft foot  . CARDIAC CATHETERIZATION N/A 05/05/2016   Procedure: Left Heart Cath and Coronary Angiography;  Surgeon: Leonie Man, MD;  Location: [facility];  Service: Cardiovascular;  Laterality: N/A;  . CARDIAC DEFIBRILLATOR PLACEMENT    . EP IMPLANTABLE DEVICE N/A 12/19/2016   Procedure: ICD Generator Changeout;  Surgeon: [name], MD;  Location: [facility];  Service: Cardiovascular;  Laterality: N/A;  . WRIST TENODESIS      Current Outpatient Medications  Medication Sig Dispense Refill  . amiodarone (PACERONE) 200 MG tablet Take 1 tablet (200 mg total) by mouth 2 (two) times daily. 60 tablet 3  . aspirin 81 MG tablet Take 81 mg by mouth daily.    Marland Kitchen atorvastatin (LIPITOR) 80 MG tablet TAKE ONE TABLET BY MOUTH DAILY IN THE EVENING 30 tablet 11  . clopidogrel (PLAVIX) 75 MG tablet Take 1 tablet (75 mg total) by mouth daily. 90 tablet 2  . COREG CR 80 MG 24 hr capsule TAKE 1 CAPSULE(80 MG) BY MOUTH DAILY 30 capsule 9  . FEROSUL 325 (65 Fe) MG tablet TAKE 1 TABLET BY MOUTH DAILY WITH BREAKFAST 60 tablet 1  . hydrochlorothiazide (HYDRODIURIL) 25 MG tablet TAKE 1 TABLET BY MOUTH EVERY DAY 90 tablet 2  . levETIRAcetam (KEPPRA) 500 MG tablet Take 1 tablet (500 mg total) by mouth 2 (two) times daily. 60 tablet 11  . pantoprazole (PROTONIX) 40 MG tablet Take  1 tablet (40 mg total) daily by mouth. 30 tablet 11  . pantoprazole (PROTONIX) 40 MG tablet TAKE 1 TABLET(40 MG) BY MOUTH DAILY 30 tablet 3  . polyethylene glycol (MIRALAX / GLYCOLAX) packet Take 17 g by mouth daily as needed for moderate constipation or severe constipation. 14 each 1  . potassium chloride (K-DUR,KLOR-CON) 10 MEQ tablet TAKE 1 TABLET BY MOUTH EVERY DAY 90 tablet 3  . sacubitril-valsartan (ENTRESTO) 97-103 MG Take 1 tablet by mouth 2 (two) times daily. 60 tablet 11  . sildenafil (VIAGRA) 50 MG tablet PLEASE SCHEDULE AN APPT FOR FUTURE REFILLS 30 tablet 1   No current facility-administered  medications for this visit.     No Known Allergies  Review of Systems negative except from HPI and PMH  Physical Exam BP 130/86   Pulse 73   Ht 6\' 6"  (1.981 m)   Wt 291 lb 6.4 oz (132.2 kg)   SpO2 93%   BMI 33.67 kg/m  Well developed and nourished in no acute distress HENT normal Neck supple with JVP-flat Clear Regular rate and rhythm, no murmurs or gallops Abd-soft with active BS No Clubbing cyanosis edema Skin-warm and dry A & Oriented  Grossly normal sensory and motor function   ECG    AV pacing at 73 Interval 24/12/47   Assessment and  Plan  PVCs few and far between   Ventricular tachycardia    ICD  medtronic  The patient's device was interrogated.  The information was reviewed. No changes were made in the programming.    Ischemic Cardiomyopathy    CHF chronic systolic    Seizure disorder   Intercurrent ventricular tachycardia associated with acute alcohol intoxication.  We will continue with amiodarone 400 daily for 2 weeks and then reduce it to 200 mg a day.  Without symptoms of ischemia  Euvolemic continue current meds  enocuraged to be wary of depression and avoid the alcohol   We spent more than 50% of our >25 min visit in face to face counseling regarding the above

## 2018-11-19 LAB — CBC
Hematocrit: 45 % (ref 37.5–51.0)
Hemoglobin: 15.1 g/dL (ref 13.0–17.7)
MCH: 32.1 pg (ref 26.6–33.0)
MCHC: 33.6 g/dL (ref 31.5–35.7)
MCV: 96 fL (ref 79–97)
PLATELETS: 275 10*3/uL (ref 150–450)
RBC: 4.7 x10E6/uL (ref 4.14–5.80)
RDW: 16.5 % — AB (ref 12.3–15.4)
WBC: 10.5 10*3/uL (ref 3.4–10.8)

## 2018-11-19 LAB — HEPATIC FUNCTION PANEL
ALT: 35 IU/L (ref 0–44)
AST: 30 IU/L (ref 0–40)
Albumin: 3.8 g/dL (ref 3.5–5.5)
Alkaline Phosphatase: 302 IU/L — ABNORMAL HIGH (ref 39–117)
Bilirubin Total: 0.4 mg/dL (ref 0.0–1.2)
Bilirubin, Direct: 0.15 mg/dL (ref 0.00–0.40)
TOTAL PROTEIN: 7.3 g/dL (ref 6.0–8.5)

## 2018-11-19 LAB — BASIC METABOLIC PANEL
BUN / CREAT RATIO: 9 (ref 9–20)
BUN: 11 mg/dL (ref 6–24)
CO2: 28 mmol/L (ref 20–29)
CREATININE: 1.19 mg/dL (ref 0.76–1.27)
Calcium: 9.5 mg/dL (ref 8.7–10.2)
Chloride: 99 mmol/L (ref 96–106)
GFR, EST AFRICAN AMERICAN: 77 mL/min/{1.73_m2} (ref >59)
GFR, EST NON AFRICAN AMERICAN: 66 mL/min/{1.73_m2} (ref >59)
Glucose: 121 mg/dL — ABNORMAL HIGH (ref 65–99)
Potassium: 5 mmol/L (ref 3.5–5.2)
SODIUM: 142 mmol/L (ref 134–144)

## 2018-11-19 LAB — TSH: TSH: 3.48 u[IU]/mL (ref 0.450–4.500)

## 2018-11-20 LAB — CUP PACEART INCLINIC DEVICE CHECK
Battery Voltage: 3.01 V
Brady Statistic AP VP Percent: 0.87 %
Brady Statistic AP VS Percent: 47.81 %
Brady Statistic AS VP Percent: 0.17 %
Brady Statistic AS VS Percent: 51.15 %
Brady Statistic RA Percent Paced: 48.43 %
Brady Statistic RV Percent Paced: 1.11 %
Date Time Interrogation Session: 20191205212859
HighPow Impedance: 47 Ohm
HighPow Impedance: 63 Ohm
Implantable Lead Implant Date: 20101117
Implantable Lead Location: 753859
Implantable Lead Model: 5076
Implantable Lead Model: 7121
Implantable Pulse Generator Implant Date: 20180105
Lead Channel Impedance Value: 342 Ohm
Lead Channel Impedance Value: 456 Ohm
Lead Channel Impedance Value: 456 Ohm
Lead Channel Pacing Threshold Amplitude: 0.625 V
Lead Channel Pacing Threshold Amplitude: 1 V
Lead Channel Pacing Threshold Pulse Width: 0.4 ms
Lead Channel Sensing Intrinsic Amplitude: 17.625 mV
Lead Channel Sensing Intrinsic Amplitude: 22.5 mV
Lead Channel Sensing Intrinsic Amplitude: 4.25 mV
Lead Channel Sensing Intrinsic Amplitude: 5.25 mV
Lead Channel Setting Pacing Amplitude: 1.5 V
Lead Channel Setting Pacing Amplitude: 2 V
Lead Channel Setting Pacing Pulse Width: 0.4 ms
Lead Channel Setting Sensing Sensitivity: 0.6 mV
MDC IDC LEAD IMPLANT DT: 20051120
MDC IDC LEAD LOCATION: 753860
MDC IDC MSMT BATTERY REMAINING LONGEVITY: 107 mo
MDC IDC MSMT LEADCHNL RA PACING THRESHOLD PULSEWIDTH: 0.4 ms

## 2018-11-29 ENCOUNTER — Telehealth: Payer: Self-pay

## 2018-11-29 DIAGNOSIS — Z79899 Other long term (current) drug therapy: Secondary | ICD-10-CM

## 2018-11-29 NOTE — Telephone Encounter (Signed)
-----   Message from [name], MD sent at 11/29/2018  8:56 AM EST ----- Please Inform Patient  Labs are normal x elevated alkaline phosphatase which needs to be explored to determine whether it is of bone or liver origin  This is doen be ordering GGT or 5'nucleotidase  Thanks

## 2018-11-29 NOTE — Telephone Encounter (Signed)
Spoke with the pt and advised him his lab results and will come in this week to have a GGT drawn. He has to check and see when he can get a ride.

## 2018-12-28 ENCOUNTER — Ambulatory Visit (INDEPENDENT_AMBULATORY_CARE_PROVIDER_SITE_OTHER): Payer: Worker's Compensation

## 2018-12-28 DIAGNOSIS — I472 Ventricular tachycardia, unspecified: Secondary | ICD-10-CM

## 2018-12-29 NOTE — Progress Notes (Signed)
Remote ICD transmission.   

## 2018-12-30 ENCOUNTER — Telehealth: Payer: Self-pay | Admitting: Cardiovascular Disease

## 2018-12-30 NOTE — Telephone Encounter (Signed)
New Message   Pam from [facility] supply  calling to inform Dr. Evette Georges nurse on how to get the approval for patient to get CPap machine.

## 2018-12-31 NOTE — Telephone Encounter (Signed)
Spoke with Jeannene Patella and she informs me that per this patient's [facility] handler Dr Claiborne Billings needs to submit a "RFA" form requesting authorization for the ordered CPAP machine. Authorization form printed from the [facility] carrier's website and completed. Will have Dr Claiborne Billings sign when he returns to the office and fax to [employer].patient will be notified of this.

## 2019-01-01 LAB — CUP PACEART REMOTE DEVICE CHECK
Battery Remaining Longevity: 106 mo
Battery Voltage: 3.01 V
Brady Statistic AP VP Percent: 0.23 %
Brady Statistic AP VS Percent: 22.39 %
Brady Statistic AS VP Percent: 0.09 %
Brady Statistic AS VS Percent: 77.29 %
Brady Statistic RA Percent Paced: 22.42 %
Brady Statistic RV Percent Paced: 0.35 %
Date Time Interrogation Session: 20200114051706
HighPow Impedance: 46 Ohm
HighPow Impedance: 58 Ohm
Implantable Lead Implant Date: 20051120
Implantable Lead Location: 753859
Implantable Lead Model: 5076
Implantable Lead Model: 7121
Implantable Pulse Generator Implant Date: 20180105
Lead Channel Impedance Value: 304 Ohm
Lead Channel Impedance Value: 418 Ohm
Lead Channel Impedance Value: 456 Ohm
Lead Channel Pacing Threshold Amplitude: 0.625 V
Lead Channel Pacing Threshold Pulse Width: 0.4 ms
Lead Channel Pacing Threshold Pulse Width: 0.4 ms
Lead Channel Sensing Intrinsic Amplitude: 18.75 mV
Lead Channel Sensing Intrinsic Amplitude: 18.75 mV
Lead Channel Sensing Intrinsic Amplitude: 3.75 mV
Lead Channel Sensing Intrinsic Amplitude: 3.75 mV
Lead Channel Setting Pacing Amplitude: 1.5 V
Lead Channel Setting Pacing Pulse Width: 0.4 ms
Lead Channel Setting Sensing Sensitivity: 0.6 mV
MDC IDC LEAD IMPLANT DT: 20101117
MDC IDC LEAD LOCATION: 753860
MDC IDC MSMT LEADCHNL RV PACING THRESHOLD AMPLITUDE: 1 V
MDC IDC SET LEADCHNL RV PACING AMPLITUDE: 2 V

## 2019-01-11 ENCOUNTER — Other Ambulatory Visit: Payer: Self-pay | Admitting: Cardiovascular Disease

## 2019-01-12 ENCOUNTER — Telehealth: Payer: Self-pay | Admitting: *Deleted

## 2019-01-12 NOTE — Telephone Encounter (Signed)
Patient notified order for CPAP machine has been sent to his WC carrier.

## 2019-01-19 ENCOUNTER — Other Ambulatory Visit: Payer: PRIVATE HEALTH INSURANCE | Admitting: *Deleted

## 2019-01-19 ENCOUNTER — Encounter: Payer: Self-pay | Admitting: Neurology

## 2019-01-19 ENCOUNTER — Ambulatory Visit (INDEPENDENT_AMBULATORY_CARE_PROVIDER_SITE_OTHER): Payer: No Typology Code available for payment source | Admitting: Neurology

## 2019-01-19 ENCOUNTER — Other Ambulatory Visit: Payer: Self-pay | Admitting: Physician Assistant

## 2019-01-19 ENCOUNTER — Ambulatory Visit (INDEPENDENT_AMBULATORY_CARE_PROVIDER_SITE_OTHER): Payer: Worker's Compensation | Admitting: Physician Assistant

## 2019-01-19 ENCOUNTER — Other Ambulatory Visit: Payer: Self-pay

## 2019-01-19 ENCOUNTER — Encounter: Payer: Self-pay | Admitting: Physician Assistant

## 2019-01-19 VITALS — BP 148/98 | HR 74 | Ht 78.0 in | Wt 286.0 lb

## 2019-01-19 VITALS — BP 140/86 | HR 76 | Ht 78.0 in | Wt 286.0 lb

## 2019-01-19 DIAGNOSIS — G40009 Localization-related (focal) (partial) idiopathic epilepsy and epileptic syndromes with seizures of localized onset, not intractable, without status epilepticus: Secondary | ICD-10-CM

## 2019-01-19 DIAGNOSIS — G4733 Obstructive sleep apnea (adult) (pediatric): Secondary | ICD-10-CM

## 2019-01-19 DIAGNOSIS — I2510 Atherosclerotic heart disease of native coronary artery without angina pectoris: Secondary | ICD-10-CM

## 2019-01-19 DIAGNOSIS — E785 Hyperlipidemia, unspecified: Secondary | ICD-10-CM | POA: Diagnosis not present

## 2019-01-19 DIAGNOSIS — I10 Essential (primary) hypertension: Secondary | ICD-10-CM | POA: Diagnosis not present

## 2019-01-19 DIAGNOSIS — I5042 Chronic combined systolic (congestive) and diastolic (congestive) heart failure: Secondary | ICD-10-CM

## 2019-01-19 DIAGNOSIS — N529 Male erectile dysfunction, unspecified: Secondary | ICD-10-CM

## 2019-01-19 DIAGNOSIS — Z9989 Dependence on other enabling machines and devices: Secondary | ICD-10-CM

## 2019-01-19 DIAGNOSIS — Z79899 Other long term (current) drug therapy: Secondary | ICD-10-CM

## 2019-01-19 MED ORDER — LEVETIRACETAM 500 MG PO TABS: 500.0000 mg | ORAL_TABLET | Freq: Two times a day (BID) | ORAL | 11 refills | Status: DC

## 2019-01-19 MED ORDER — SILDENAFIL CITRATE 50 MG PO TABS: ORAL_TABLET | ORAL | 2 refills | Status: DC

## 2019-01-19 NOTE — Patient Instructions (Signed)
Great seeing you! Continue Keppra 500mg  twice a day. Continue to monitor symptoms. Follow-up in 6 months, call for any changes.  Seizure Precautions: 1. If medication has been prescribed for you to prevent seizures, take it exactly as directed.  Do not stop taking the medicine without talking to your doctor first, even if you have not had a seizure in a long time.   2. Avoid activities in which a seizure would cause danger to yourself or to others.  Don't operate dangerous machinery, swim alone, or climb in high or dangerous places, such as on ladders, roofs, or girders.  Do not drive unless your doctor says you may.  3. If you have any warning that you may have a seizure, lay down in a safe place where you can't hurt yourself.    4.  No driving for 6 months from last seizure, as per [facility].   Please refer to the following link on the [facility] website for more information: http://www.epilepsyfoundation.org/answerplace/Social/driving/drivingu.cfm   5.  Maintain good sleep hygiene. Avoid alcohol.  6.  Contact your doctor if you have any problems that may be related to the medicine you are taking.  7.  Call 911 and bring the patient back to the ED if:        A.  The seizure lasts longer than 5 minutes.       B.  The patient doesn't awaken shortly after the seizure  C.  The patient has new problems such as difficulty seeing, speaking or moving  D.  The patient was injured during the seizure  E.  The patient has a temperature over 102 F (39C)  F.  The patient vomited and now is having trouble breathing

## 2019-01-19 NOTE — Progress Notes (Signed)
Cardiology Office Note    Date:  01/21/2019   ID:  Cathy, Ropp 07/20/1961, MRN [id number]  PCP:  Patient, No Pcp Per  Cardiologist:  Dr. Sallyanne Kuster Sleep: Dr. Claiborne Billings EP: Dr. Caryl Comes  Chief Complaint  Patient presents with  . Follow-up    seen for Dr. Sallyanne Kuster    History of Present Illness:  Gregory [name] is a 58 y.o. male with past medical history of CAD, chronic combined systolic and diastolic heart failure, hypertension, hyperlipidemia, ischemic cardiomyopathy and obstructive sleep apnea on CPAP.  Patient has known chronic total occlusion of RCA with previous DES to LAD, diagonal and left circumflex artery.  In 2017, he presented with sustained monomorphic VT below the ICD detection rate.  He underwent urgent cardioversion in the emergency room. He subsequently had a left and right heart cath which showed no real change to the coronary anatomy.  His first OM1 was subtotally occluded and there was high-grade disease in the PL artery, neither one was amenable to revascularization.  His LVEDP was severely elevated.  Amiodarone was started.  VT detection rate was decreased to 150 bpm in the hospital and later decreased to 130 bpm after he returned with recurrent VT at his office visit.  He has been maintained on maximum dose of carvedilol and Entresto.  Last ETT obtained on 07/14/2016 did not reveal any ST changes. in June 2019, he was admitted for symptomatic anemia.  Last echocardiogram obtained on 06/08/2018 showed EF 60 to 65%, grade 2 DD, severe LVE.  His most recent visit with Dr. Caryl Comes was on 11/18/2018, he did have some recurrent ventricular tachycardia, however this was associated with acute alcohol intoxication.  He was continued on amiodarone 400 mg daily for 2 weeks then with plan to reduce it to 200 mg daily thereafter.  Patient presents today for cardiology office visit.  He denies any recent dizziness, palpitation, presyncope or chest discomfort.  He has been  compliant with his current medication.  He has reduced amiodarone to 200 mg daily.  He requested a refill for Viagra.   Past Medical History:  Diagnosis Date  . CAD (coronary artery disease) 12/01/2013  . Chronic combined systolic and diastolic CHF, NYHA class 1 ([place]) 12/01/2013  . Erectile dysfunction 12/01/2013  . HTN (hypertension) 12/01/2013  . Hyperlipidemia 12/01/2013  . Ischemic cardiomyopathy 12/01/2013  . Sleep apnea     Past Surgical History:  Procedure Laterality Date  . ACHILLES TENDON REPAIR     lft foot  . CARDIAC CATHETERIZATION N/A 05/05/2016   Procedure: Left Heart Cath and Coronary Angiography;  Surgeon: Leonie Man, MD;  Location: [facility];  Service: Cardiovascular;  Laterality: N/A;  . CARDIAC DEFIBRILLATOR PLACEMENT    . EP IMPLANTABLE DEVICE N/A 12/19/2016   Procedure: ICD Generator Changeout;  Surgeon: [name], MD;  Location: [facility];  Service: Cardiovascular;  Laterality: N/A;  . WRIST TENODESIS      Current Medications: Outpatient Medications Prior to Visit  Medication Sig Dispense Refill  . amiodarone (PACERONE) 200 MG tablet Take 1 tablet (200 mg total) by mouth 2 (two) times daily. 60 tablet 3  . aspirin 81 MG tablet Take 81 mg by mouth daily.    Marland Kitchen atorvastatin (LIPITOR) 80 MG tablet TAKE ONE TABLET BY MOUTH DAILY IN THE EVENING 30 tablet 11  . clopidogrel (PLAVIX) 75 MG tablet Take 1 tablet (75 mg total) by mouth daily. 90 tablet 2  . COREG  CR 80 MG 24 hr capsule TAKE 1 CAPSULE(80 MG) BY MOUTH DAILY 30 capsule 9  . FEROSUL 325 (65 Fe) MG tablet TAKE 1 TABLET BY MOUTH DAILY WITH BREAKFAST 60 tablet 1  . hydrochlorothiazide (HYDRODIURIL) 25 MG tablet TAKE 1 TABLET BY MOUTH EVERY DAY 90 tablet 2  . pantoprazole (PROTONIX) 40 MG tablet Take 1 tablet (40 mg total) daily by mouth. 30 tablet 11  . polyethylene glycol (MIRALAX / GLYCOLAX) packet Take 17 g by mouth daily as needed for moderate constipation or severe constipation. 14  each 1  . potassium chloride (K-DUR,KLOR-CON) 10 MEQ tablet TAKE 1 TABLET BY MOUTH EVERY DAY 90 tablet 3  . levETIRAcetam (KEPPRA) 500 MG tablet Take 1 tablet (500 mg total) by mouth 2 (two) times daily. 60 tablet 11  . pantoprazole (PROTONIX) 40 MG tablet TAKE 1 TABLET(40 MG) BY MOUTH DAILY 30 tablet 3  . sacubitril-valsartan (ENTRESTO) 97-103 MG Take 1 tablet by mouth 2 (two) times daily. 60 tablet 11  . sildenafil (VIAGRA) 50 MG tablet PLEASE SCHEDULE AN APPT FOR FUTURE REFILLS 30 tablet 1   No facility-administered medications prior to visit.      Allergies:   Patient has no known allergies.   Social History   Socioeconomic History  . Marital status: Single    Spouse name: Not on file  . Number of children: Not on file  . Years of education: Not on file  . Highest education level: Not on file  Occupational History  . Not on file  Social Needs  . Financial resource strain: Not on file  . Food insecurity:    Worry: Not on file    Inability: Not on file  . Transportation needs:    Medical: Not on file    Non-medical: Not on file  Tobacco Use  . Smoking status: Former Smoker    Packs/day: 0.00    Years: 30.00    Pack years: 0.00    Types: Cigarettes    Last attempt to quit: 05/31/2013    Years since quitting: 5.6  . Smokeless tobacco: Never Used  Substance and Sexual Activity  . Alcohol use: No    Comment: ocassionally  . Drug use: Never  . Sexual activity: Not Currently  Lifestyle  . Physical activity:    Days per week: Not on file    Minutes per session: Not on file  . Stress: Not on file  Relationships  . Social connections:    Talks on phone: Not on file    Gets together: Not on file    Attends religious service: Not on file    Active member of club or organization: Not on file    Attends meetings of clubs or organizations: Not on file    Relationship status: Not on file  Other Topics Concern  . Not on file  Social History Narrative   Pt lies in split  level home with his son, daughter and grand-son   Has 3 children   Some college education   Retired Scientist, forensic.      Family History:  The patient's family history includes Brain cancer in his father; Heart disease in his mother; Hypertension in his daughter.   ROS:   Please see the history of present illness.    ROS All other systems reviewed and are negative.   PHYSICAL EXAM:   VS:  BP (!) 148/98 (BP Location: Left Arm, Patient Position: Sitting, Cuff Size: Large)   Pulse  74   Ht 6\' 6"  (1.981 m)   Wt 286 lb (129.7 kg)   BMI 33.05 kg/m    GEN: Well nourished, well developed, in no acute distress  HEENT: normal  Neck: no JVD, carotid bruits, or masses Cardiac: RRR; no murmurs, rubs, or gallops,no edema  Respiratory:  clear to auscultation bilaterally, normal work of breathing GI: soft, nontender, nondistended, + BS MS: no deformity or atrophy  Skin: warm and dry, no rash Neuro:  Alert and Oriented x 3, Strength and sensation are intact Psych: euthymic mood, full affect  Wt Readings from Last 3 Encounters:  01/19/19 286 lb (129.7 kg)  01/19/19 286 lb (129.7 kg)  11/18/18 291 lb 6.4 oz (132.2 kg)      Studies/Labs Reviewed:   EKG:  EKG is not ordered today.   Recent Labs: 11/18/2018: ALT 35; BUN 11; Creatinine, Ser 1.19; Hemoglobin 15.1; Platelets 275; Potassium 5.0; Sodium 142; TSH 3.480   Lipid Panel    Component Value Date/Time   CHOL 132 10/15/2017 1013   TRIG 57 10/15/2017 1013   HDL 34 (L) 10/15/2017 1013   CHOLHDL 3.9 10/15/2017 1013   CHOLHDL 4.2 05/05/2016 0526   VLDL 11 05/05/2016 0526   LDLCALC 87 10/15/2017 1013    Additional studies/ records that were reviewed today include:   Echo 06/08/2018 LV EF: 60% -   65%  ------------------------------------------------------------------- Indications:      Syncope 780.2.  ------------------------------------------------------------------- History:   PMH:   Coronary artery disease.   Congestive heart failure.  PMH:  Seizure. Sleep Apnea.  Risk factors:  Hypertension. Dyslipidemia.  ------------------------------------------------------------------- Study Conclusions  - Left ventricle: Systolic function was normal. The estimated   ejection fraction was in the range of 60% to 65%. Wall motion was   normal; there were no regional wall motion abnormalities.   Features are consistent with a pseudonormal left ventricular   filling pattern, with concomitant abnormal relaxation and   increased filling pressure (grade 2 diastolic dysfunction). - Left atrium: The atrium was severely dilated.    ASSESSMENT:    1. Coronary artery disease involving native coronary artery of native heart without angina pectoris   2. Chronic combined systolic and diastolic CHF (congestive heart failure) ([place])   3. Essential hypertension   4. Hyperlipidemia LDL goal <70   5. OSA on CPAP   6. Erectile dysfunction, unspecified erectile dysfunction type      PLAN:  In order of problems listed above:  1. CAD: Denies any recent chest discomfort.  On aspirin, Plavix and Lipitor  2. Chronic diastolic heart failure: EF normalized on most recent echocardiogram.  Continue carvedilol and Entresto  3. Hypertension: Blood pressure mildly elevated on today's visit.  Normally his blood pressure is very well controlled  4. Hyperlipidemia: Lipitor 80 mg daily.  5. Obstructive sleep apnea: on CPAP  6. Erectile dysfunction: Refill Viagra.    Medication Adjustments/Labs and Tests Ordered: Current medicines are reviewed at length with the patient today.  Concerns regarding medicines are outlined above.  Medication changes, Labs and Tests ordered today are listed in the Patient Instructions below. Patient Instructions  Medication Instructions:  Your physician recommends that you continue on your current medications as directed. Please refer to the Current Medication list given to you today. If you  need a refill on your cardiac medications before your next appointment, please call your pharmacy.   Lab work: NONE  If you have labs (blood work) drawn today and your tests are completely  normal, you will receive your results only by: Marland Kitchen MyChart Message (if you have MyChart) OR . A paper copy in the mail If you have any lab test that is abnormal or we need to change your treatment, we will call you to review the results.  Testing/Procedures: NONE   Follow-Up: At [facility], you and your health needs are our priority.  As part of our continuing mission to provide you with exceptional heart care, we have created designated Provider Care Teams.  These Care Teams include your primary Cardiologist (physician) and Advanced Practice Providers (APPs -  Physician Assistants and Nurse Practitioners) who all work together to provide you with the care you need, when you need it. You will need a follow up appointment in 6 months.  Please call our office 2 months in advance to schedule this appointment.  You may see Dr Sanda Klein or one of the following Advanced Practice Providers on your designated Care Team: [place], [place] . Fabian Sharp, PA-C  Any Other Special Instructions Will Be Listed Below (If Applicable).      Hilbert Corrigan, [place]  01/21/2019 11:17 PM    [facility] [address], [place], [place]  [address] Phone: [phone]; Fax: [phone]

## 2019-01-19 NOTE — Patient Instructions (Signed)
Medication Instructions:  Your physician recommends that you continue on your current medications as directed. Please refer to the Current Medication list given to you today. If you need a refill on your cardiac medications before your next appointment, please call your pharmacy.   Lab work: NONE  If you have labs (blood work) drawn today and your tests are completely normal, you will receive your results only by: Marland Kitchen MyChart Message (if you have MyChart) OR . A paper copy in the mail If you have any lab test that is abnormal or we need to change your treatment, we will call you to review the results.  Testing/Procedures: NONE   Follow-Up: At [facility], you and your health needs are our priority.  As part of our continuing mission to provide you with exceptional heart care, we have created designated Provider Care Teams.  These Care Teams include your primary Cardiologist (physician) and Advanced Practice Providers (APPs -  Physician Assistants and Nurse Practitioners) who all work together to provide you with the care you need, when you need it. You will need a follow up appointment in 6 months.  Please call our office 2 months in advance to schedule this appointment.  You may see Dr Sanda Klein or one of the following Advanced Practice Providers on your designated Care Team: [place], [place] . Fabian Sharp, PA-C  Any Other Special Instructions Will Be Listed Below (If Applicable).

## 2019-01-19 NOTE — Progress Notes (Signed)
NEUROLOGY FOLLOW UP OFFICE NOTE  Gregory [name] [id number] 1961/12/15  HISTORY OF PRESENT ILLNESS: I had the pleasure of seeing Gregory [name] in follow-up in the neurology clinic on 01/19/2019. He is again accompanied by his son who helps supplement the history today. The patient was last seen 3 months ago after he had 2 episodes of unresponsiveness last 06/07/18. Head CT no acute changes, routine EEG normal, however his 24-hour EEG was abnormal with right temporal focal slowing. He had 2 electrographic seizures from the right temporal region that he was unaware of/asymptomatic from. He was started on Levetiracetam 500mg  BID last November 2019. He and his son deny any episodes of staring/unresponsiveness. He lives with his son. He denies any gaps in time, olfactory/gustatory hallucinations, focal numbness/tingling/weakness, myoclonic jerks. No headaches, dizziness, vision changes, no falls. No side effects on Keppra. Mood is good.   History on Initial Assessment 09/20/2018: This is a 58 year old right-handed man with a history of iron deficiency anemia, hypertension, CHF, CAD, s/p ICD placement, presenting after 2 episodes of unresponsiveness last 06/07/18. He reports that he had been lowering BP medication due to low BP and was feeling tired and dizzy. His last recollection was sitting in the car with his friend, then has no further recollection of events until he was in the ER. Notes reviewed, his friend noted he was staring straight ahead, unresponsive, then "he started talking like he was on a game show." He was back to baseline on arrival to the ER. While in the ER, nursing staff witnessed him to be staring off into space for around 20 seconds then had generalized tonic-clonic seizure-like activity that lasted for about a minute. He had slow sonorous breathing after and was then confused, gradually coming around. It was reported that he was hypotensive on the scene. BP on arrival to the ER was  103/62. He was significantly anemic, Hgb 6.9. I personally reviewed head CT without contrast which did not show any acute changes, there was mild chronic microvascular disease. His wake and drowsy EEG was normal. Echocardiogram showed an EF of 78-29%, grade 2 diastolic dysfunction, left atrium severely dilated. Since this was the first event, and possibly provoked (hypoperfusion), seizure medication was not started. He received a blood transfusion and states that he feels "perfect" since then. He and his son deny any other staring/unresponsive episodes, gaps in time, olfactory/gustatory hallucinations, deja vu, rising epigastric sensation, focal numbness/tingling/weakness, myoclonic jerks.He denies any headaches, diplopia, dysarthria/dysphagia, neck/back pain, bowel/bladder dysfunction. He had a normal birth and early development.  There is no history of febrile convulsions, CNS infections such as meningitis/encephalitis, significant traumatic brain injury, neurosurgical procedures, or family history of seizures.  Diagnostic Data: 24-hour EEG done October 2019 was abnormal with occasional focal slowing over the right frontotemporal region. There were 2 electrographic seizures from the right temporal region lasting 50-150 seconds. CT head no acute changes  PAST MEDICAL HISTORY: Past Medical History:  Diagnosis Date  . CAD (coronary artery disease) 12/01/2013  . Chronic combined systolic and diastolic CHF, NYHA class 1 ([place]) 12/01/2013  . Erectile dysfunction 12/01/2013  . HTN (hypertension) 12/01/2013  . Hyperlipidemia 12/01/2013  . Ischemic cardiomyopathy 12/01/2013  . Sleep apnea     MEDICATIONS: Current Outpatient Medications on File Prior to Visit  Medication Sig Dispense Refill  . amiodarone (PACERONE) 200 MG tablet Take 1 tablet (200 mg total) by mouth 2 (two) times daily. 60 tablet 3  . aspirin 81 MG tablet Take 81 mg  by mouth daily.    Marland Kitchen atorvastatin (LIPITOR) 80 MG tablet TAKE ONE  TABLET BY MOUTH DAILY IN THE EVENING 30 tablet 11  . clopidogrel (PLAVIX) 75 MG tablet Take 1 tablet (75 mg total) by mouth daily. 90 tablet 2  . COREG CR 80 MG 24 hr capsule TAKE 1 CAPSULE(80 MG) BY MOUTH DAILY 30 capsule 9  . FEROSUL 325 (65 Fe) MG tablet TAKE 1 TABLET BY MOUTH DAILY WITH BREAKFAST 60 tablet 1  . hydrochlorothiazide (HYDRODIURIL) 25 MG tablet TAKE 1 TABLET BY MOUTH EVERY DAY 90 tablet 2  . levETIRAcetam (KEPPRA) 500 MG tablet Take 1 tablet (500 mg total) by mouth 2 (two) times daily. 60 tablet 11  . pantoprazole (PROTONIX) 40 MG tablet Take 1 tablet (40 mg total) daily by mouth. 30 tablet 11  . pantoprazole (PROTONIX) 40 MG tablet TAKE 1 TABLET(40 MG) BY MOUTH DAILY 30 tablet 3  . polyethylene glycol (MIRALAX / GLYCOLAX) packet Take 17 g by mouth daily as needed for moderate constipation or severe constipation. 14 each 1  . potassium chloride (K-DUR,KLOR-CON) 10 MEQ tablet TAKE 1 TABLET BY MOUTH EVERY DAY 90 tablet 3  . sacubitril-valsartan (ENTRESTO) 97-103 MG Take 1 tablet by mouth 2 (two) times daily. 60 tablet 11   No current facility-administered medications on file prior to visit.     ALLERGIES: No Known Allergies  FAMILY HISTORY: Family History  Problem Relation Age of Onset  . Heart disease Mother   . Brain cancer Father   . Hypertension Daughter     SOCIAL HISTORY: Social History   Socioeconomic History  . Marital status: Single    Spouse name: Not on file  . Number of children: Not on file  . Years of education: Not on file  . Highest education level: Not on file  Occupational History  . Not on file  Social Needs  . Financial resource strain: Not on file  . Food insecurity:    Worry: Not on file    Inability: Not on file  . Transportation needs:    Medical: Not on file    Non-medical: Not on file  Tobacco Use  . Smoking status: Former Smoker    Packs/day: 0.00    Years: 30.00    Pack years: 0.00    Types: Cigarettes    Last attempt to  quit: 05/31/2013    Years since quitting: 5.6  . Smokeless tobacco: Never Used  Substance and Sexual Activity  . Alcohol use: No    Comment: ocassionally  . Drug use: Never  . Sexual activity: Not Currently  Lifestyle  . Physical activity:    Days per week: Not on file    Minutes per session: Not on file  . Stress: Not on file  Relationships  . Social connections:    Talks on phone: Not on file    Gets together: Not on file    Attends religious service: Not on file    Active member of club or organization: Not on file    Attends meetings of clubs or organizations: Not on file    Relationship status: Not on file  . Intimate partner violence:    Fear of current or ex partner: Not on file    Emotionally abused: Not on file    Physically abused: Not on file    Forced sexual activity: Not on file  Other Topics Concern  . Not on file  Social History Narrative   Pt lies in  split level home with his son, daughter and grand-son   Has 3 children   Some college education   Retired Scientist, forensic.     REVIEW OF SYSTEMS: Constitutional: No fevers, chills, or sweats, no generalized fatigue, change in appetite Eyes: No visual changes, double vision, eye pain Ear, nose and throat: No hearing loss, ear pain, nasal congestion, sore throat Cardiovascular: No chest pain, palpitations Respiratory:  No shortness of breath at rest or with exertion, wheezes GastrointestinaI: No nausea, vomiting, diarrhea, abdominal pain, fecal incontinence Genitourinary:  No dysuria, urinary retention or frequency Musculoskeletal:  No neck pain, back pain Integumentary: No rash, pruritus, skin lesions Neurological: as above Psychiatric: No depression, insomnia, anxiety Endocrine: No palpitations, fatigue, diaphoresis, mood swings, change in appetite, change in weight, increased thirst Hematologic/Lymphatic:  No anemia, purpura, petechiae. Allergic/Immunologic: no itchy/runny eyes, nasal  congestion, recent allergic reactions, rashes  PHYSICAL EXAM: Vitals:   01/19/19 1401  BP: 140/86  Pulse: 76  SpO2: 97%   General: No acute distress Head:  Normocephalic/atraumatic Neck: supple, no paraspinal tenderness, full range of motion Heart:  Regular rate and rhythm Lungs:  Clear to auscultation bilaterally Back: No paraspinal tenderness Skin/Extremities: No rash, no edema Neurological Exam: alert and oriented to person, place, and time. No aphasia or dysarthria. Fund of knowledge is appropriate.  Recent and remote memory are intact.  Attention and concentration are normal.    Able to name objects and repeat phrases. Cranial nerves: Pupils equal, round, reactive to light. Extraocular movements intact with no nystagmus. Visual fields full. Facial sensation intact. No facial asymmetry. Tongue, uvula, palate midline.  Motor: Bulk and tone normal, muscle strength 5/5 throughout with no pronator drift.  Sensation to light touch intact.  No extinction to double simultaneous stimulation. Finger to nose testing intact.  Gait narrow-based and steady, able to tandem walk adequately.  Romberg negative.  IMPRESSION: This is a pleasant 58 yo RH man with a history of  iron deficiency anemia, hypertension, CHF, CAD, s/p ICD placement, who had 2 episodes of unresponsiveness with staring last 06/07/18, the second episode witnessed in the ER was followed by convulsive activity. His 24-hour EEG captured 2 electrographic seizures arising from the right temporal region. He was started on Levetiracetam 500mg  BID in November 2019. He lives with his son, who has not witnessed any clinical seizures. By report, no episodes of loss of awareness since June 2019. He denies any side effects on Levetiracetam 500mg  BID, refills sent. Son and patient were instructed to continue to monitor symptoms. He is aware of [place] driving laws to stop driving after an episode of loss of consciousness/awareness, until 6 months event-free.  He will follow-up in 6 months and knows to call for any changes.   Thank you for allowing me to participate in his care.  Please do not hesitate to call for any questions or concerns.  The duration of this appointment visit was 20 minutes of face-to-face time with the patient.  Greater than 50% of this time was spent in counseling, explanation of diagnosis, planning of further management, and coordination of care.   Ellouise Newer, M.D.

## 2019-01-20 ENCOUNTER — Telehealth: Payer: Self-pay

## 2019-01-20 ENCOUNTER — Other Ambulatory Visit: Payer: Self-pay

## 2019-01-20 LAB — GAMMA GT: GGT: 428 IU/L — ABNORMAL HIGH (ref 0–65)

## 2019-01-20 MED ORDER — SACUBITRIL-VALSARTAN 97-103 MG PO TABS: 1.0000 | ORAL_TABLET | Freq: Two times a day (BID) | ORAL | 11 refills | Status: DC

## 2019-01-20 NOTE — Telephone Encounter (Signed)
Gregory [name] [id number] was approved via PA on covermymeds through 01/14/2020

## 2019-01-20 NOTE — Telephone Encounter (Signed)
Prior Auth request received for Entresto 97-103mg  from [employer] ID: [employer] Phone: [phone]   PA for Entresto 97-103mg  approved through 01/15/2020. Approval information has been sent to the patients pharmacy [facility].

## 2019-01-21 ENCOUNTER — Encounter: Payer: Self-pay | Admitting: Physician Assistant

## 2019-02-10 NOTE — Telephone Encounter (Signed)
Pt aware of results and recommendations. He understands to call the office if he begins to feel more palpitations. He has no additional questions.

## 2019-02-10 NOTE — Telephone Encounter (Signed)
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

## 2019-02-16 ENCOUNTER — Other Ambulatory Visit: Payer: Self-pay | Admitting: Cardiovascular Disease

## 2019-02-17 NOTE — Telephone Encounter (Signed)
° ° °*  STAT* If patient is at the pharmacy, call can be transferred to refill team.   1. Which medications need to be refilled? (please list name of each medication and dose if known) Viagra  2. Which pharmacy/location (including street and city if local pharmacy) is medication to be sent to? [facility]  3. Do they need a 30 day or 90 day supply? [place]

## 2019-02-22 MED ORDER — SILDENAFIL CITRATE 50 MG PO TABS: ORAL_TABLET | ORAL | 0 refills | Status: DC

## 2019-02-22 NOTE — Addendum Note (Signed)
Addended by: Kathyrn Lass on: 02/22/2019 05:48 PM   Modules accepted: Orders

## 2019-03-02 ENCOUNTER — Telehealth: Payer: Self-pay

## 2019-03-02 NOTE — Telephone Encounter (Signed)
lmtcb

## 2019-03-03 ENCOUNTER — Ambulatory Visit: Payer: Self-pay | Admitting: Cardiovascular Disease

## 2019-03-07 ENCOUNTER — Other Ambulatory Visit: Payer: Self-pay | Admitting: Cardiology

## 2019-03-24 ENCOUNTER — Other Ambulatory Visit: Payer: Self-pay | Admitting: Cardiovascular Disease

## 2019-03-29 ENCOUNTER — Other Ambulatory Visit: Payer: Self-pay

## 2019-03-29 ENCOUNTER — Ambulatory Visit (INDEPENDENT_AMBULATORY_CARE_PROVIDER_SITE_OTHER): Payer: Worker's Compensation | Admitting: *Deleted

## 2019-03-29 DIAGNOSIS — I255 Ischemic cardiomyopathy: Secondary | ICD-10-CM

## 2019-03-29 LAB — CUP PACEART REMOTE DEVICE CHECK
Battery Remaining Longevity: 104 mo
Battery Voltage: 3.01 V
Brady Statistic AP VP Percent: 0.84 %
Brady Statistic AP VS Percent: 48.55 %
Brady Statistic AS VP Percent: 0.15 %
Brady Statistic AS VS Percent: 50.46 %
Brady Statistic RA Percent Paced: 48.03 %
Brady Statistic RV Percent Paced: 1.05 %
Date Time Interrogation Session: 20200414195324
HighPow Impedance: 50 Ohm
HighPow Impedance: 64 Ohm
Implantable Lead Implant Date: 20051120
Implantable Lead Implant Date: 20101117
Implantable Lead Location: 753859
Implantable Lead Location: 753860
Implantable Lead Model: 5076
Implantable Lead Model: 7121
Implantable Pulse Generator Implant Date: 20180105
Lead Channel Impedance Value: 342 Ohm
Lead Channel Impedance Value: 456 Ohm
Lead Channel Impedance Value: 456 Ohm
Lead Channel Pacing Threshold Amplitude: 0.625 V
Lead Channel Pacing Threshold Amplitude: 1.125 V
Lead Channel Pacing Threshold Pulse Width: 0.4 ms
Lead Channel Pacing Threshold Pulse Width: 0.4 ms
Lead Channel Sensing Intrinsic Amplitude: 19.25 mV
Lead Channel Sensing Intrinsic Amplitude: 19.25 mV
Lead Channel Sensing Intrinsic Amplitude: 4.125 mV
Lead Channel Sensing Intrinsic Amplitude: 4.125 mV
Lead Channel Setting Pacing Amplitude: 1.5 V
Lead Channel Setting Pacing Amplitude: 2.25 V
Lead Channel Setting Pacing Pulse Width: 0.4 ms
Lead Channel Setting Sensing Sensitivity: 0.6 mV

## 2019-04-05 ENCOUNTER — Encounter: Payer: Self-pay | Admitting: Cardiology

## 2019-04-05 NOTE — Progress Notes (Signed)
Remote ICD transmission.   

## 2019-04-11 ENCOUNTER — Ambulatory Visit: Payer: Self-pay | Admitting: Neurology

## 2019-05-13 NOTE — Telephone Encounter (Signed)
Spoke with pt to discuss device beeping. Transmission received and reviewed, no alerts or episodes. Lead trends and histograms stable. After discussing with pt, pt states there are magnets on his new CPAP mask. Informed pt of what a magnet does when it comes into contact with his device. Advised to contact provider that prescribed CPAP to request a new mask. Pt expresses understanding. All questions answered.

## 2019-05-26 ENCOUNTER — Other Ambulatory Visit: Payer: Self-pay | Admitting: Cardiovascular Disease

## 2019-06-28 ENCOUNTER — Ambulatory Visit (INDEPENDENT_AMBULATORY_CARE_PROVIDER_SITE_OTHER): Payer: Worker's Compensation | Admitting: *Deleted

## 2019-06-28 DIAGNOSIS — I472 Ventricular tachycardia, unspecified: Secondary | ICD-10-CM

## 2019-06-28 DIAGNOSIS — I255 Ischemic cardiomyopathy: Secondary | ICD-10-CM

## 2019-06-28 LAB — CUP PACEART REMOTE DEVICE CHECK
Battery Remaining Longevity: 100 mo
Battery Voltage: 3.01 V
Brady Statistic AP VP Percent: 0.59 %
Brady Statistic AP VS Percent: 34.1 %
Brady Statistic AS VP Percent: 0.16 %
Brady Statistic AS VS Percent: 65.15 %
Brady Statistic RA Percent Paced: 33.67 %
Brady Statistic RV Percent Paced: 0.78 %
Date Time Interrogation Session: 20200714073325
HighPow Impedance: 50 Ohm
HighPow Impedance: 62 Ohm
Implantable Lead Implant Date: 20051120
Implantable Lead Implant Date: 20101117
Implantable Lead Location: 753859
Implantable Lead Location: 753860
Implantable Lead Model: 5076
Implantable Lead Model: 7121
Implantable Pulse Generator Implant Date: 20180105
Lead Channel Impedance Value: 361 Ohm
Lead Channel Impedance Value: 418 Ohm
Lead Channel Impedance Value: 475 Ohm
Lead Channel Pacing Threshold Amplitude: 0.5 V
Lead Channel Pacing Threshold Amplitude: 0.75 V
Lead Channel Pacing Threshold Pulse Width: 0.4 ms
Lead Channel Pacing Threshold Pulse Width: 0.4 ms
Lead Channel Sensing Intrinsic Amplitude: 19.75 mV
Lead Channel Sensing Intrinsic Amplitude: 19.75 mV
Lead Channel Sensing Intrinsic Amplitude: 4 mV
Lead Channel Sensing Intrinsic Amplitude: 4 mV
Lead Channel Setting Pacing Amplitude: 1.5 V
Lead Channel Setting Pacing Amplitude: 2 V
Lead Channel Setting Pacing Pulse Width: 0.4 ms
Lead Channel Setting Sensing Sensitivity: 0.6 mV

## 2019-07-12 ENCOUNTER — Other Ambulatory Visit: Payer: Self-pay | Admitting: Cardiovascular Disease

## 2019-07-14 NOTE — Progress Notes (Signed)
Remote ICD transmission.   

## 2019-08-01 ENCOUNTER — Other Ambulatory Visit: Payer: Self-pay

## 2019-08-08 ENCOUNTER — Telehealth: Payer: Self-pay | Admitting: Cardiovascular Disease

## 2019-08-08 ENCOUNTER — Other Ambulatory Visit: Payer: Self-pay

## 2019-08-08 ENCOUNTER — Other Ambulatory Visit: Payer: Self-pay | Admitting: Cardiovascular Disease

## 2019-08-08 MED ORDER — HYDROCHLOROTHIAZIDE 25 MG PO TABS: 25.0000 mg | ORAL_TABLET | Freq: Every day | ORAL | 0 refills | Status: DC

## 2019-08-08 NOTE — Telephone Encounter (Signed)
Requested Prescriptions   Signed Prescriptions Disp Refills  . hydrochlorothiazide (HYDRODIURIL) 25 MG tablet 30 tablet 0    Sig: Take 1 tablet (25 mg total) by mouth daily.    Authorizing Provider: Troy Sine    Ordering User: Raelene Bott, Matas Burrows L

## 2019-08-08 NOTE — Telephone Encounter (Signed)
New Message    *STAT* If patient is at the pharmacy, call can be transferred to refill team.   1. Which medications need to be refilled? (please list name of each medication and dose if known) hydrochlorothiazide (HYDRODIURIL) 25 MG tablet  2. Which pharmacy/location (including street and city if local pharmacy) is medication to be sent to?[place] [place] - [address] AT [place]  3. Do they need a 30 day or 90 day supply? 30 day

## 2019-08-08 NOTE — Telephone Encounter (Signed)
No message needed °

## 2019-08-09 ENCOUNTER — Encounter: Payer: Self-pay | Admitting: Neurology

## 2019-08-09 ENCOUNTER — Other Ambulatory Visit: Payer: Self-pay | Admitting: Cardiovascular Disease

## 2019-08-09 ENCOUNTER — Other Ambulatory Visit: Payer: Self-pay

## 2019-08-09 ENCOUNTER — Telehealth (INDEPENDENT_AMBULATORY_CARE_PROVIDER_SITE_OTHER): Payer: No Typology Code available for payment source | Admitting: Neurology

## 2019-08-09 VITALS — Ht 78.0 in | Wt 285.0 lb

## 2019-08-09 DIAGNOSIS — G40009 Localization-related (focal) (partial) idiopathic epilepsy and epileptic syndromes with seizures of localized onset, not intractable, without status epilepticus: Secondary | ICD-10-CM | POA: Diagnosis not present

## 2019-08-09 MED ORDER — LEVETIRACETAM 500 MG PO TABS: 500.0000 mg | ORAL_TABLET | Freq: Two times a day (BID) | ORAL | 11 refills | Status: DC

## 2019-08-09 NOTE — Progress Notes (Signed)
Virtual Visit via Video Note The purpose of this virtual visit is to provide medical care while limiting exposure to the novel coronavirus.    Consent was obtained for video visit:  Yes.   Answered questions that patient had about telehealth interaction:  Yes.   I discussed the limitations, risks, security and privacy concerns of performing an evaluation and management service by telemedicine. I also discussed with the patient that there may be a patient responsible charge related to this service. The patient expressed understanding and agreed to proceed.  Pt location: Home Physician Location: office Name of referring provider:  No ref. provider found I connected with Gregory [name] at patients initiation/request on 08/09/2019 at  2:30 PM EDT by video enabled telemedicine application and verified that I am speaking with the correct person using two identifiers. Pt MRN:  [id number] Pt DOB:  02/16/1961 Video Participants:  Gregory [name]   History of Present Illness:  The patient was seen as a virtual video visit on 08/09/2019. 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 He continues to do well with no seizures since 09/2018 (electrographic seizures on EEG). His last clinical seizure was in June 2019 when he had 2 episodes of unresponsiveness. He is taking Levetiracetam 500mg  BID without side effects. He denies any episodes of staring/unresponsiveness, gaps in time, olfactory/gustatory hallucinations, focal numbness/tingling/weakness, myoclonic jerks. He has occasional deja vu with no associated confusion. He denies any headaches, dizziness, vision changes, no falls. He only gets 4 hours of sleep despite having a new CPAP machine. He has difficulty with sleep maintenance. Mood is fine.   History on Initial Assessment 09/20/2018: This is a 58 year old right-handed man with a history of iron deficiency anemia, hypertension, CHF, CAD,  s/p ICD placement, presenting after 2 episodes of unresponsiveness last 06/07/18. He reports that he had been lowering BP medication due to low BP and was feeling tired and dizzy. His last recollection was sitting in the car with his friend, then has no further recollection of events until he was in the ER. Notes reviewed, his friend noted he was staring straight ahead, unresponsive, then "he started talking like he was on a game show." He was back to baseline on arrival to the ER. While in the ER, nursing staff witnessed him to be staring off into space for around 20 seconds then had generalized tonic-clonic seizure-like activity that lasted for about a minute. He had slow sonorous breathing after and was then confused, gradually coming around. It was reported that he was hypotensive on the scene. BP on arrival to the ER was 103/62. He was significantly anemic, Hgb 6.9. I personally reviewed head CT without contrast which did not show any acute changes, there was mild chronic microvascular disease. His wake and drowsy EEG was normal. Echocardiogram showed an EF of 123456, grade 2 diastolic dysfunction, left atrium severely dilated. Since this was the first event, and possibly provoked (hypoperfusion), seizure medication was not started. He received a blood transfusion and states that he feels "perfect" since then. He and his son deny any other staring/unresponsive episodes, gaps in time, olfactory/gustatory hallucinations, deja vu, rising epigastric sensation, focal numbness/tingling/weakness, myoclonic jerks.He denies any headaches, diplopia, dysarthria/dysphagia, neck/back pain, bowel/bladder dysfunction. He had a normal birth and early development.  There is no history of febrile convulsions, CNS infections such as meningitis/encephalitis, significant traumatic brain injury, neurosurgical procedures, or family history of seizures.  Diagnostic Data: 24-hour  EEG done October 2019 was abnormal with occasional  focal slowing over the right frontotemporal region. There were 2 electrographic seizures from the right temporal region lasting 50-150 seconds. CT head no acute changes     Current Outpatient Medications on File Prior to Visit  Medication Sig Dispense Refill  . aspirin 81 MG tablet Take 81 mg by mouth daily.    Marland Kitchen atorvastatin (LIPITOR) 80 MG tablet TAKE ONE TABLET BY MOUTH EVERY EVENING 30 tablet 6  . clopidogrel (PLAVIX) 75 MG tablet TAKE 1 TABLET(75 MG) BY MOUTH DAILY 90 tablet 2  . ENTRESTO 97-103 MG TAKE 1 TABLET BY MOUTH TWICE DAILY 60 tablet 3  . FEROSUL 325 (65 Fe) MG tablet TAKE 1 TABLET BY MOUTH DAILY WITH BREAKFAST 60 tablet 1  . hydrochlorothiazide (HYDRODIURIL) 25 MG tablet Take 1 tablet (25 mg total) by mouth daily. 30 tablet 0  . pantoprazole (PROTONIX) 40 MG tablet TAKE 1 TABLET(40 MG) BY MOUTH DAILY 30 tablet 11  . polyethylene glycol (MIRALAX / GLYCOLAX) packet Take 17 g by mouth daily as needed for moderate constipation or severe constipation. 14 each 1  . potassium chloride (K-DUR) 10 MEQ tablet TAKE 1 TABLET BY MOUTH EVERY DAY 90 tablet 3  . sildenafil (VIAGRA) 50 MG tablet TAKE ONE TABLET BY MOUTH DAILY AS NEEDED FOR ERECTILE DYSFUNCTION 15 tablet 0   No current facility-administered medications on file prior to visit.      Observations/Objective:   Vitals:   08/09/19 1411  Weight: 285 lb (129.3 kg)  Height: 6\' 6"  (1.981 m)   GEN:  The patient appears stated age and is in NAD.  Neurological examination: Patient is awake, alert, oriented x 3. No aphasia or dysarthria. Intact fluency and comprehension. Remote and recent memory intact. Able to name and repeat. Cranial nerves: Extraocular movements intact with no nystagmus. No facial asymmetry. Motor: moves all extremities symmetrically, at least anti-gravity x 4. No incoordination on finger to nose testing. Gait: narrow-based and steady, able to tandem walk adequately. Negative Romberg test.  Assessment and Plan:    This is a pleasant 58 yo RH man with a history of  iron deficiency anemia, hypertension, CHF, CAD, s/p ICD placement, who had 2 episodes of unresponsiveness with staring last 06/07/18, the second episode witnessed in the ER was followed by convulsive activity. His 24-hour EEG in October 2019 captured 2 electrographic seizures arising from the right temporal region. He has been taking Levetiracetam 500mg  BID, no clinical seizures since 05/2018. Refills sent. He is aware of [place] driving laws to stop driving after an episode of loss of consciousness/awareness, until 6 months event-free. He will follow-up in 6-8 months and knows to call for any changes.    Follow Up Instructions:   -I discussed the assessment and treatment plan with the patient. The patient was provided an opportunity to ask questions and all were answered. The patient agreed with the plan and demonstrated an understanding of the instructions.   The patient was advised to call back or seek an in-person evaluation if the symptoms worsen or if the condition fails to improve as anticipated.     [name], MD

## 2019-08-30 ENCOUNTER — Ambulatory Visit: Payer: Self-pay | Admitting: Neurology

## 2019-09-05 ENCOUNTER — Other Ambulatory Visit: Payer: Self-pay

## 2019-09-05 MED ORDER — COREG CR 80 MG PO CP24: ORAL_CAPSULE | ORAL | 1 refills | Status: DC

## 2019-09-05 MED ORDER — HYDROCHLOROTHIAZIDE 25 MG PO TABS: 25.0000 mg | ORAL_TABLET | Freq: Every day | ORAL | 1 refills | Status: DC

## 2019-09-26 ENCOUNTER — Telehealth: Payer: Self-pay | Admitting: Cardiovascular Disease

## 2019-09-26 ENCOUNTER — Other Ambulatory Visit: Payer: Self-pay | Admitting: Cardiovascular Disease

## 2019-09-26 DIAGNOSIS — G4733 Obstructive sleep apnea (adult) (pediatric): Secondary | ICD-10-CM

## 2019-09-26 DIAGNOSIS — Z9989 Dependence on other enabling machines and devices: Secondary | ICD-10-CM

## 2019-09-26 NOTE — Telephone Encounter (Signed)
° ° ° °  What problem are you experiencing? Request of supplies (see Mychart message) 1) Who is your medical equipment company? [employer]   Please route to the sleep study assistant.

## 2019-09-28 ENCOUNTER — Ambulatory Visit (INDEPENDENT_AMBULATORY_CARE_PROVIDER_SITE_OTHER): Payer: Worker's Compensation | Admitting: *Deleted

## 2019-09-28 DIAGNOSIS — I472 Ventricular tachycardia, unspecified: Secondary | ICD-10-CM

## 2019-09-28 DIAGNOSIS — I255 Ischemic cardiomyopathy: Secondary | ICD-10-CM

## 2019-09-29 LAB — CUP PACEART REMOTE DEVICE CHECK
Battery Remaining Longevity: 97 mo
Battery Voltage: 2.99 V
Brady Statistic AP VP Percent: 0.52 %
Brady Statistic AP VS Percent: 28.29 %
Brady Statistic AS VP Percent: 0.13 %
Brady Statistic AS VS Percent: 71.05 %
Brady Statistic RA Percent Paced: 27.74 %
Brady Statistic RV Percent Paced: 0.68 %
Date Time Interrogation Session: 20201014122824
HighPow Impedance: 50 Ohm
HighPow Impedance: 61 Ohm
Implantable Lead Implant Date: 20051120
Implantable Lead Implant Date: 20101117
Implantable Lead Location: 753859
Implantable Lead Location: 753860
Implantable Lead Model: 5076
Implantable Lead Model: 7121
Implantable Pulse Generator Implant Date: 20180105
Lead Channel Impedance Value: 342 Ohm
Lead Channel Impedance Value: 456 Ohm
Lead Channel Impedance Value: 475 Ohm
Lead Channel Pacing Threshold Amplitude: 0.5 V
Lead Channel Pacing Threshold Amplitude: 0.75 V
Lead Channel Pacing Threshold Pulse Width: 0.4 ms
Lead Channel Pacing Threshold Pulse Width: 0.4 ms
Lead Channel Sensing Intrinsic Amplitude: 18.125 mV
Lead Channel Sensing Intrinsic Amplitude: 18.125 mV
Lead Channel Sensing Intrinsic Amplitude: 5.125 mV
Lead Channel Sensing Intrinsic Amplitude: 5.125 mV
Lead Channel Setting Pacing Amplitude: 1.5 V
Lead Channel Setting Pacing Amplitude: 2 V
Lead Channel Setting Pacing Pulse Width: 0.4 ms
Lead Channel Setting Sensing Sensitivity: 0.6 mV

## 2019-10-12 NOTE — Progress Notes (Signed)
Remote ICD transmission.   

## 2019-10-14 ENCOUNTER — Telehealth: Payer: Self-pay | Admitting: *Deleted

## 2019-10-14 NOTE — Telephone Encounter (Signed)
-----   Message from Ardelle Anton sent at 10/14/2019 10:22 AM EDT ----- Regarding: SUPPLIES Contact: [phone] Patient is using [facility] for his CPAP machine.  Please fax a copy of his sleep study and a prescription for his machine to [phone].    He said that he requested this several weeks ago but has not heard from anyone.  Joslyn Devon

## 2019-10-14 NOTE — Telephone Encounter (Signed)
Left message orders for CPAP and supplies were ordered via [employer] for [employer] on 09/26/19. Gregory [name] was notified. She was re notified again today the orders are in [place]. Please follow up.

## 2019-10-23 NOTE — Progress Notes (Signed)
Virtual Visit via Video Note   This visit type was conducted due to national recommendations for restrictions regarding the COVID-19 Pandemic (e.g. social distancing) in an effort to limit this patient's exposure and mitigate transmission in our community.  Due to his co-morbid illnesses, this patient is at least at moderate risk for complications without adequate follow up.  This format is felt to be most appropriate for this patient at this time.  All issues noted in this document were discussed and addressed.  A limited physical exam was performed with this format.  Please refer to the patient's chart for his consent to telehealth for [facility].   Date:  10/24/2019   ID:  Gregory [name], DOB 29-Nov-1961, MRN [id number]  Patient Location: Home Provider Location: Home  PCP:  Patient, No Pcp Per  Cardiologist:  Sanda Klein, MD  Electrophysiologist:  None   Evaluation Performed:  Follow-Up Visit  Chief Complaint:  CAD, CHF  History of Present Illness:    Pantelis Ahron [name] is a 58 y.o. male with CAD, chronic systolic and diastolic heart failure, ischemic cardiomyopathy s/p dual chamber ICD (gen change 2018), OSA on CPAP, seizure disorder, ventricular tachycardia, HTN, and HLD. He has a known occluded RCA and previous DES to LAD, diagonal, and LCx. In 2017 he presented to the ER with sustained monomorphic VT below the ICD detection rate. He was treated with urgent cardioversion in the ER. Left and right heart cath showed stable coronary anatomy. OM1 was subtotally occluded and high-grade disease in the PLA, neither amenable to PCI. Detection rate was lowered to 150 bpm, but he returned to OV with recurrent VT. Detection rate was lowered to 130 bpm and he was started on amiodarone. Last echo 06/08/18 with EF of 60-65% and grade 2 DD. Left atrium was severely dilated. He was last seen by Dr. Caryl Comes 11/18/18 and had recurrent VT, but thought to be in the setting of acute  alcohol intoxication. Amiodarone was continued. He was last seen by Almyra Deforest [facility] in clinic on 01/19/19 and was doing well at that time. He recenlty called our office with abnormal beeping on his remote transmitter for his ICD. It was discovered that his new CPAP mask had magnets. He does 1 hr of cardio M-F. Overall, he is doing very well with no complaints. Hs BO cuff is broken. He will obtain a new cuff and send me 1 week of BP readings.   The patient does not have symptoms concerning for COVID-19 infection (fever, chills, cough, or new shortness of breath).    Past Medical History:  Diagnosis Date  . CAD (coronary artery disease) 12/01/2013  . Chronic combined systolic and diastolic CHF, NYHA class 1 ([place]) 12/01/2013  . Erectile dysfunction 12/01/2013  . HTN (hypertension) 12/01/2013  . Hyperlipidemia 12/01/2013  . Ischemic cardiomyopathy 12/01/2013  . Sleep apnea    Past Surgical History:  Procedure Laterality Date  . ACHILLES TENDON REPAIR     lft foot  . CARDIAC CATHETERIZATION N/A 05/05/2016   Procedure: Left Heart Cath and Coronary Angiography;  Surgeon: Leonie Man, MD;  Location: [place] [facility];  Service: Cardiovascular;  Laterality: N/A;  . CARDIAC DEFIBRILLATOR PLACEMENT    . EP IMPLANTABLE DEVICE N/A 12/19/2016   Procedure: ICD Generator Changeout;  Surgeon: [name], MD;  Location: [place] [facility];  Service: Cardiovascular;  Laterality: N/A;  . WRIST TENODESIS       Current Meds  Medication Sig  .  aspirin 81 MG tablet Take 81 mg by mouth daily.  Marland Kitchen atorvastatin (LIPITOR) 80 MG tablet TAKE ONE TABLET BY MOUTH EVERY EVENING  . clopidogrel (PLAVIX) 75 MG tablet TAKE 1 TABLET(75 MG) BY MOUTH DAILY  . COREG CR 80 MG 24 hr capsule TAKE 1 CAPSULE(80 MG) BY MOUTH DAILY  . ENTRESTO 97-103 MG TAKE 1 TABLET BY MOUTH TWICE DAILY  . FEROSUL 325 (65 Fe) MG tablet TAKE 1 TABLET BY MOUTH DAILY WITH BREAKFAST  . hydrochlorothiazide (HYDRODIURIL) 25 MG tablet Take 1 tablet  (25 mg total) by mouth daily.  Marland Kitchen levETIRAcetam (KEPPRA) 500 MG tablet Take 1 tablet (500 mg total) by mouth 2 (two) times daily.  . pantoprazole (PROTONIX) 40 MG tablet TAKE 1 TABLET(40 MG) BY MOUTH DAILY  . polyethylene glycol (MIRALAX / GLYCOLAX) packet Take 17 g by mouth daily as needed for moderate constipation or severe constipation.  . potassium chloride (K-DUR) 10 MEQ tablet TAKE 1 TABLET BY MOUTH EVERY DAY  . sildenafil (VIAGRA) 50 MG tablet TAKE ONE TABLET BY MOUTH DAILY AS NEEDED FOR ERECTILE DYSFUNCTION     Allergies:   Patient has no known allergies.   Social History   Tobacco Use  . Smoking status: Former Smoker    Packs/day: 0.00    Years: 30.00    Pack years: 0.00    Types: Cigarettes    Quit date: 05/31/2013    Years since quitting: 6.4  . Smokeless tobacco: Never Used  Substance Use Topics  . Alcohol use: No    Comment: ocassionally  . Drug use: Never     Family Hx: The patient's family history includes Brain cancer in his father; Heart disease in his mother; Hypertension in his daughter.  ROS:   Please see the history of present illness.     All other systems reviewed and are negative.   Prior CV studies:   The following studies were reviewed today:  Echo 06/08/18: Study Conclusions  - Left ventricle: Systolic function was normal. The estimated   ejection fraction was in the range of 60% to 65%. Wall motion was   normal; there were no regional wall motion abnormalities.   Features are consistent with a pseudonormal left ventricular   filling pattern, with concomitant abnormal relaxation and   increased filling pressure (grade 2 diastolic dysfunction). - Left atrium: The atrium was severely dilated.  Labs/Other Tests and Data Reviewed:    EKG:  No ECG reviewed.  Recent Labs: 11/18/2018: ALT 35; BUN 11; Creatinine, Ser 1.19; Hemoglobin 15.1; Platelets 275; Potassium 5.0; Sodium 142; TSH 3.480   Recent Lipid Panel Lab Results  Component Value  Date/Time   CHOL 132 10/15/2017 10:13 AM   TRIG 57 10/15/2017 10:13 AM   HDL 34 (L) 10/15/2017 10:13 AM   CHOLHDL 3.9 10/15/2017 10:13 AM   CHOLHDL 4.2 05/05/2016 05:26 AM   LDLCALC 87 10/15/2017 10:13 AM    Wt Readings from Last 3 Encounters:  08/09/19 285 lb (129.3 kg)  01/19/19 286 lb (129.7 kg)  01/19/19 286 lb (129.7 kg)     Objective:    Vital Signs:  Pulse 78   Ht 6\' 6"  (1.981 m)   BMI 32.94 kg/m    VITAL SIGNS:  reviewed GEN:  no acute distress EYES:  sclerae anicteric, EOMI - Extraocular Movements Intact RESPIRATORY:  normal respiratory effort, symmetric expansion CARDIOVASCULAR:  no peripheral edema SKIN:  no rash, lesions or ulcers. MUSCULOSKELETAL:  no obvious deformities. NEURO:  alert and oriented  x 3, no obvious focal deficit PSYCH:  normal affect  ASSESSMENT & PLAN:    CAD - continue ASA, plavix, and lipitor - he denies anginal symptoms - does 1 hr of cardio 5 days per week   Chronic systolic and diastolic heart failure Ischemic cardiomyopathy S/P ICD dual chamber Monomorphic VT - EF has normalized on 2019 echo - continue coreg and entresto - Dr. Caryl Comes stopped amiodarone   Hypertension - medications as above - he will call me with a week of BP readings when he gets a new cuff.   OSA on CPAP - compliant   Will collect: CBC BMP TSH Fasting lipids    COVID-19 Education: The signs and symptoms of COVID-19 were discussed with the patient and how to seek care for testing (follow up with PCP or arrange E-visit).  The importance of social distancing was discussed today.  Time:   Today, I have spent 16 minutes with the patient with telehealth technology discussing the above problems.     Medication Adjustments/Labs and Tests Ordered: Current medicines are reviewed at length with the patient today.  Concerns regarding medicines are outlined above.   Tests Ordered: Orders Placed This Encounter  Procedures  . CBC  . Basic metabolic  panel  . TSH  . Lipid Profile    Medication Changes: No orders of the defined types were placed in this encounter.   Follow Up:  Either In Person or Virtual in 1 year(s)  Signed, Ledora Bottcher, PA  10/24/2019 11:21 AM    [place]

## 2019-10-24 ENCOUNTER — Encounter: Payer: Self-pay | Admitting: Physician Assistant

## 2019-10-24 ENCOUNTER — Telehealth (INDEPENDENT_AMBULATORY_CARE_PROVIDER_SITE_OTHER): Payer: Worker's Compensation | Admitting: Physician Assistant

## 2019-10-24 VITALS — HR 78 | Ht 78.0 in

## 2019-10-24 DIAGNOSIS — G4733 Obstructive sleep apnea (adult) (pediatric): Secondary | ICD-10-CM

## 2019-10-24 DIAGNOSIS — E7800 Pure hypercholesterolemia, unspecified: Secondary | ICD-10-CM

## 2019-10-24 DIAGNOSIS — I10 Essential (primary) hypertension: Secondary | ICD-10-CM | POA: Diagnosis not present

## 2019-10-24 DIAGNOSIS — D509 Iron deficiency anemia, unspecified: Secondary | ICD-10-CM

## 2019-10-24 DIAGNOSIS — I472 Ventricular tachycardia, unspecified: Secondary | ICD-10-CM

## 2019-10-24 DIAGNOSIS — I255 Ischemic cardiomyopathy: Secondary | ICD-10-CM | POA: Diagnosis not present

## 2019-10-24 DIAGNOSIS — E785 Hyperlipidemia, unspecified: Secondary | ICD-10-CM

## 2019-10-24 DIAGNOSIS — Z9989 Dependence on other enabling machines and devices: Secondary | ICD-10-CM

## 2019-10-24 NOTE — Patient Instructions (Signed)
Medication Instructions:  Your physician recommends that you continue on your current medications as directed. Please refer to the Current Medication list given to you today.  *If you need a refill on your cardiac medications before your next appointment, please call your pharmacy*  Lab Work: Your physician recommends that you return for a FASTING lipid profile, CBC, BMET, and TSH at your earliest convenience. You do not need an appointment for labs done in our office. Please bring your lab slips with you when you return for blood work. Our lab opens at 8:00 AM.  If you have labs (blood work) drawn today and your tests are completely normal, you will receive your results only by: Marland Kitchen MyChart Message (if you have MyChart) OR . A paper copy in the mail If you have any lab test that is abnormal or we need to change your treatment, we will call you to review the results.   Follow-Up: At [facility], you and your health needs are our priority.  As part of our continuing mission to provide you with exceptional heart care, we have created designated Provider Care Teams.  These Care Teams include your primary Cardiologist (physician) and Advanced Practice Providers (APPs -  Physician Assistants and Nurse Practitioners) who all work together to provide you with the care you need, when you need it.  Your next appointment:   12 months  The format for your next appointment:   In Person  Provider:   You may see Sanda Klein, MD or one of the following Advanced Practice Providers on your designated Care Team:    Almyra Deforest, PA-C  Fabian Sharp, [place] or   Roby Lofts, [place]   Other Instructions Please call our office 2 months in advance to schedule your follow-up appointment.

## 2019-10-25 ENCOUNTER — Other Ambulatory Visit: Payer: Self-pay | Admitting: Cardiovascular Disease

## 2019-10-29 ENCOUNTER — Other Ambulatory Visit: Payer: Self-pay | Admitting: Cardiovascular Disease

## 2019-11-14 ENCOUNTER — Other Ambulatory Visit: Payer: Self-pay | Admitting: Cardiovascular Disease

## 2019-11-14 MED ORDER — COREG CR 80 MG PO CP24: 80.0000 mg | ORAL_CAPSULE | Freq: Every day | ORAL | 3 refills | Status: DC

## 2019-11-14 NOTE — Telephone Encounter (Signed)
Rx(s) sent to pharmacy electronically.  

## 2019-11-14 NOTE — Telephone Encounter (Signed)
°*  STAT* If patient is at the pharmacy, call can be transferred to refill team.   1. Which medications need to be refilled? (please list name of each medication and dose if known) COREG CR 80 MG 24 hr capsule  2. Which pharmacy/location (including street and city if local pharmacy) is medication to be sent to? [place],  - [address] AT [place]  3. Do they need a 30 day or 90 day supply? 30 day  Patient is currently out of medication.

## 2019-11-18 ENCOUNTER — Other Ambulatory Visit: Payer: Self-pay | Admitting: Cardiovascular Disease

## 2019-11-26 ENCOUNTER — Other Ambulatory Visit: Payer: Self-pay | Admitting: Cardiovascular Disease

## 2019-11-28 NOTE — Telephone Encounter (Signed)
Ok to refill this med or defer to PCP?

## 2019-12-04 IMAGING — CR DG KNEE COMPLETE 4+V*R*
4 series · 4 of 4 positions shown · non-contrast
Comparison: None.

CLINICAL DATA: Knee pain for 5 days

EXAM:
RIGHT KNEE - COMPLETE 4+ VIEW

[knee ap]
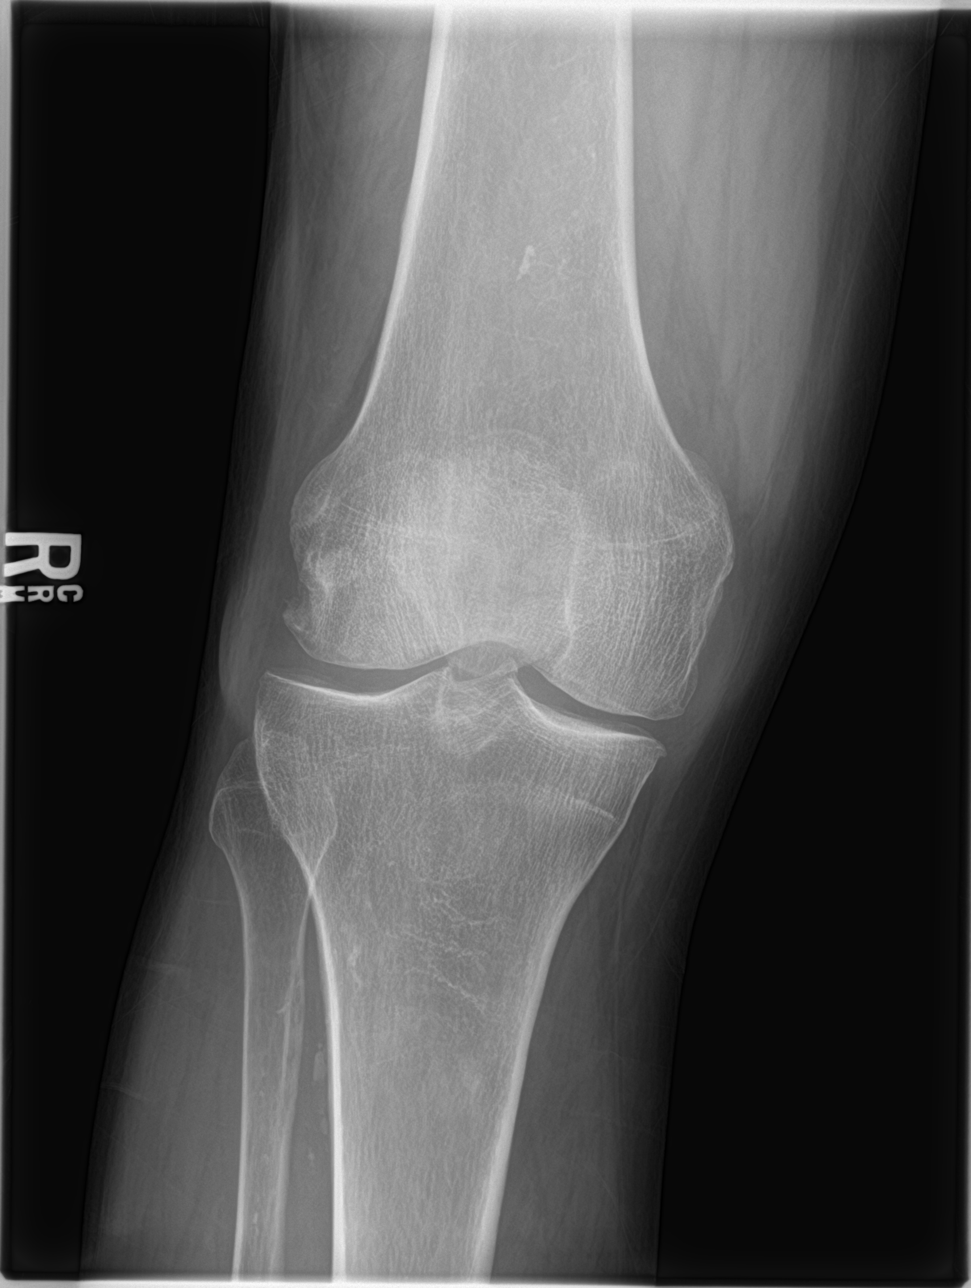

[knee lat]
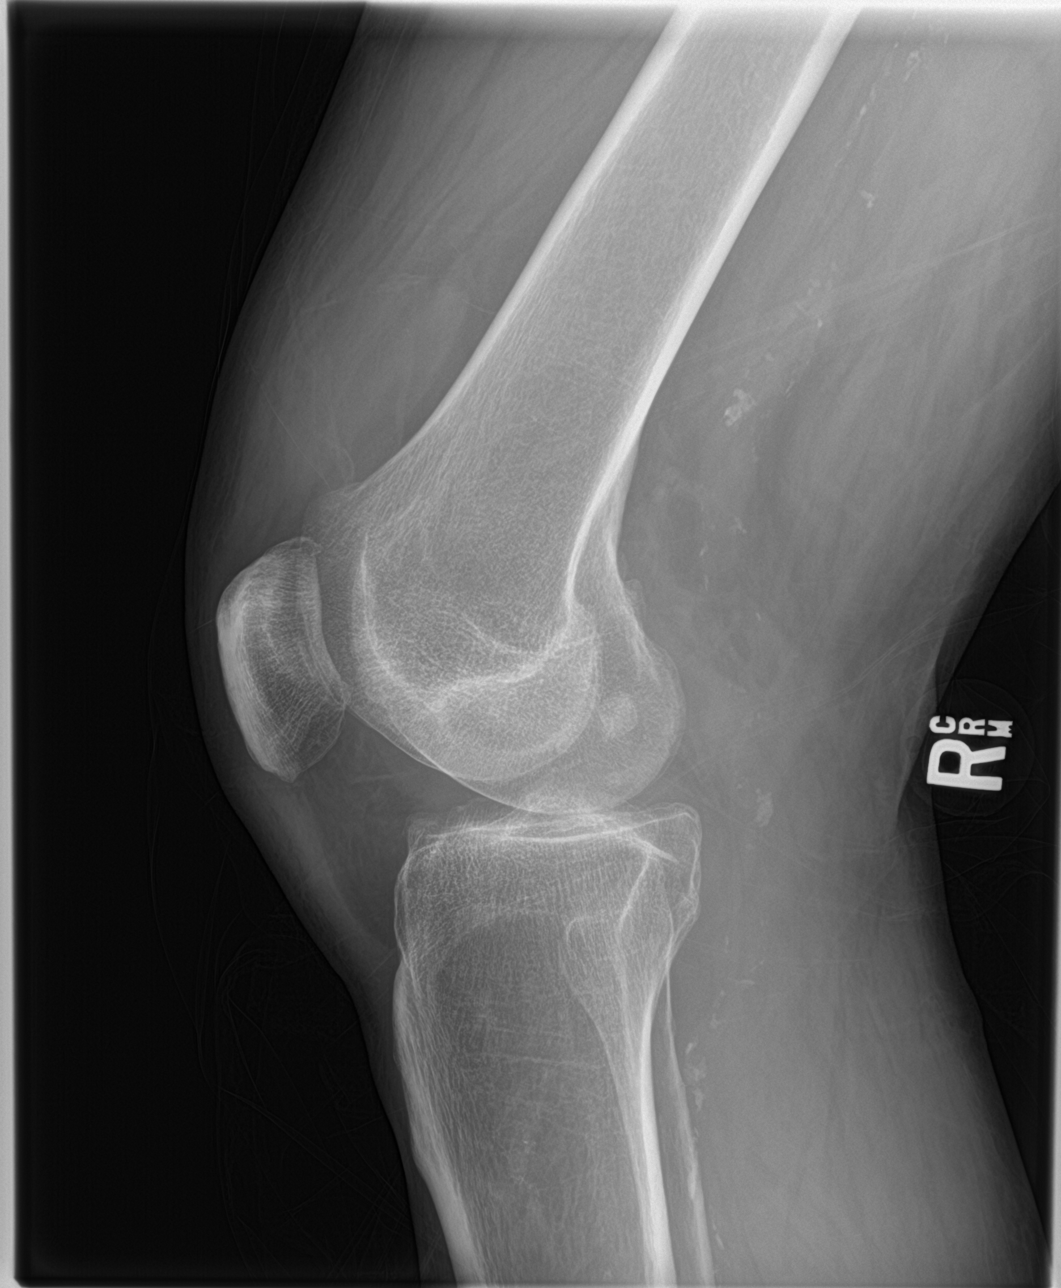

[knee obl (1 of 2)]
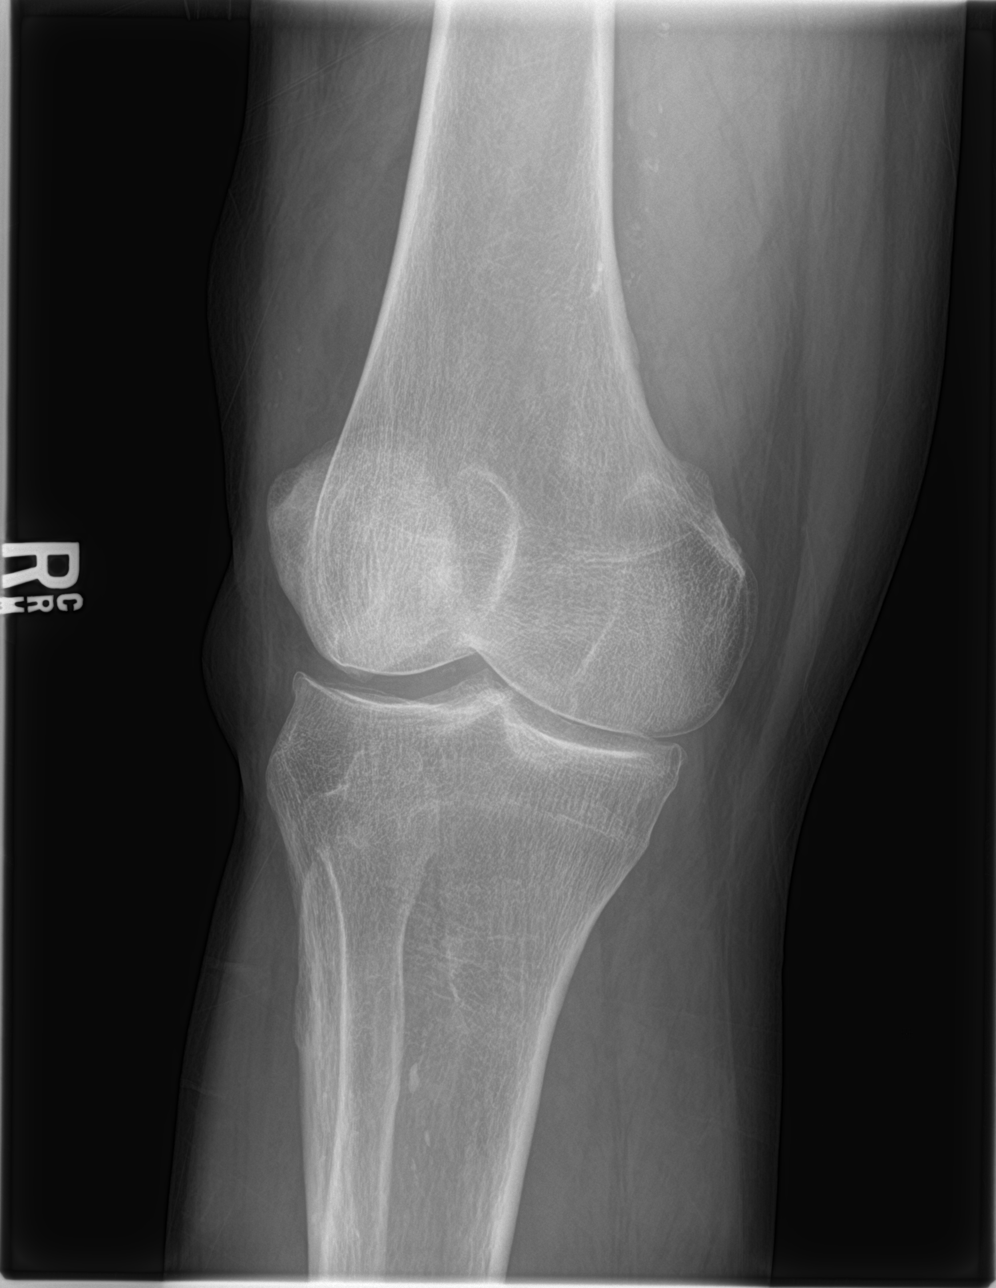

[knee obl (2 of 2)]
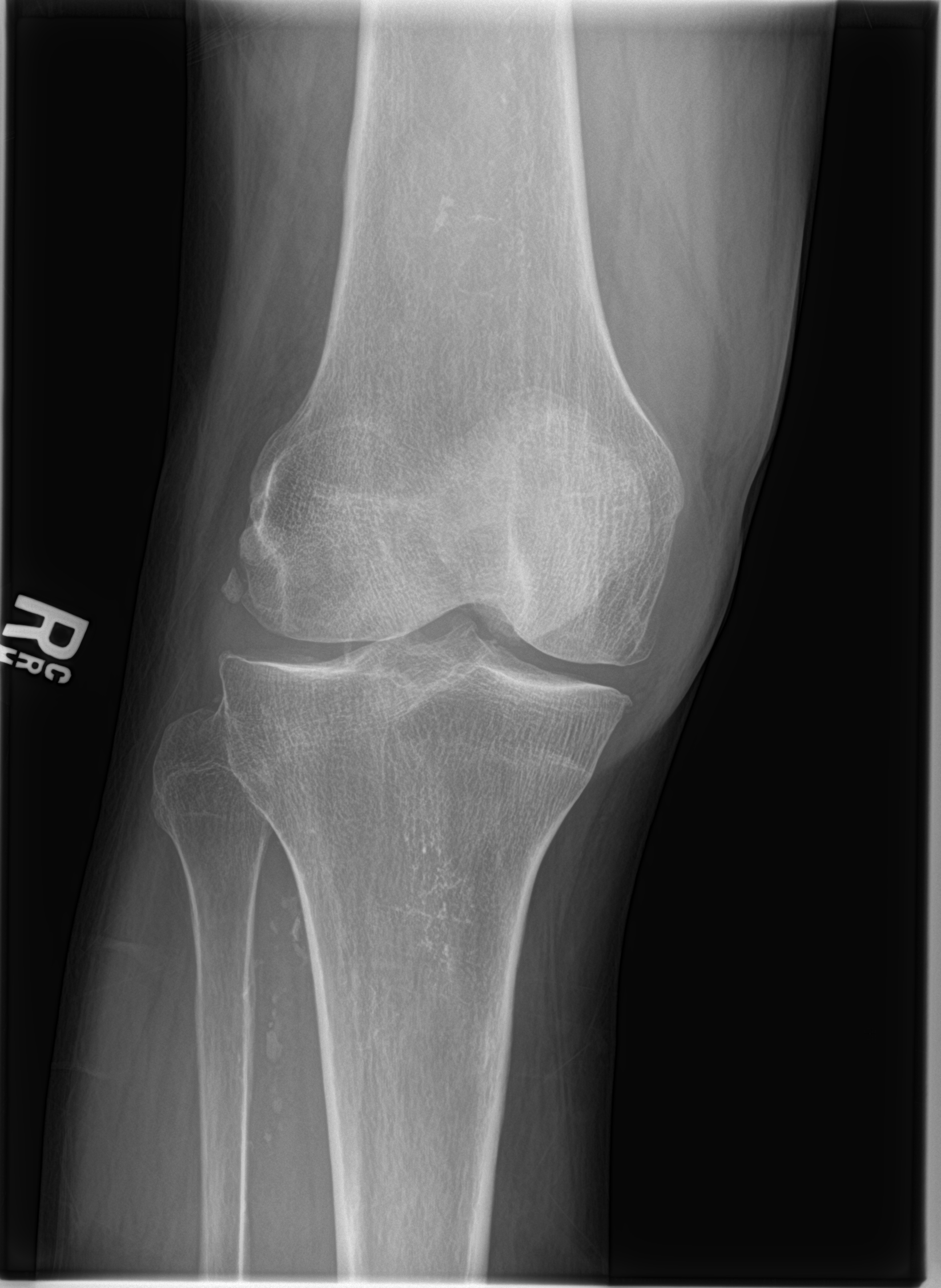

[4 of 4 positions shown; findings below may reference images not displayed]

FINDINGS: No acute fracture or dislocation. Mild medial femorotibial
compartment joint space narrowing marginal osteophytes. Tiny
patellofemoral compartment marginal osteophytes. Moderate joint
effusion. Peripheral vascular atherosclerotic disease. No soft
tissue abnormality.
IMPRESSION: No acute osseous injury of the right knee.

## 2019-12-05 ENCOUNTER — Ambulatory Visit: Payer: No Typology Code available for payment source | Attending: Internal Medicine

## 2019-12-05 DIAGNOSIS — U071 COVID-19: Secondary | ICD-10-CM

## 2019-12-06 LAB — NOVEL CORONAVIRUS, NAA: SARS-CoV-2, NAA: NOT DETECTED

## 2019-12-28 ENCOUNTER — Ambulatory Visit (INDEPENDENT_AMBULATORY_CARE_PROVIDER_SITE_OTHER): Payer: Worker's Compensation | Admitting: *Deleted

## 2019-12-28 DIAGNOSIS — I472 Ventricular tachycardia, unspecified: Secondary | ICD-10-CM

## 2019-12-28 LAB — CUP PACEART REMOTE DEVICE CHECK
Battery Remaining Longevity: 91 mo
Battery Voltage: 2.98 V
Brady Statistic AP VP Percent: 0.16 %
Brady Statistic AP VS Percent: 14.01 %
Brady Statistic AS VP Percent: 0.07 %
Brady Statistic AS VS Percent: 85.76 %
Brady Statistic RA Percent Paced: 13.69 %
Brady Statistic RV Percent Paced: 0.24 %
Date Time Interrogation Session: 20210113033524
HighPow Impedance: 47 Ohm
HighPow Impedance: 56 Ohm
Implantable Lead Implant Date: 20051120
Implantable Lead Implant Date: 20101117
Implantable Lead Location: 753859
Implantable Lead Location: 753860
Implantable Lead Model: 5076
Implantable Lead Model: 7121
Implantable Pulse Generator Implant Date: 20180105
Lead Channel Impedance Value: 342 Ohm
Lead Channel Impedance Value: 418 Ohm
Lead Channel Impedance Value: 418 Ohm
Lead Channel Pacing Threshold Amplitude: 0.625 V
Lead Channel Pacing Threshold Amplitude: 1 V
Lead Channel Pacing Threshold Pulse Width: 0.4 ms
Lead Channel Pacing Threshold Pulse Width: 0.4 ms
Lead Channel Sensing Intrinsic Amplitude: 17.125 mV
Lead Channel Sensing Intrinsic Amplitude: 4.25 mV
Lead Channel Setting Pacing Amplitude: 1.5 V
Lead Channel Setting Pacing Amplitude: 2 V
Lead Channel Setting Pacing Pulse Width: 0.4 ms
Lead Channel Setting Sensing Sensitivity: 0.6 mV

## 2019-12-29 ENCOUNTER — Telehealth: Payer: Self-pay | Admitting: *Deleted

## 2019-12-29 DIAGNOSIS — I472 Ventricular tachycardia, unspecified: Secondary | ICD-10-CM

## 2019-12-29 NOTE — Telephone Encounter (Signed)
Attempted to call patient x2, call was answered both times but so much background noise that patient was unable to hear me. Will attempt again later.  Received ICD alert on 12/28/19 transmission for treated VF episode on 12/22/19, terminated with ATP x1 during charge, charge aborted. EGM appears VT, CLs 230-369ms. Presenting rhythm also indicates frequent PVCs, episodes show increased burden of SVT. Full report available for review under "CV Procedures" tab.  Patient returned call. Reports he does not recall any symptoms with episode, likely asleep at that time. Pt reports he missed his Entresto and Coreg that day. Holidays were incredibly stressful. Had to call the police on his son's girlfriend on day of  as she was trying to kidnap his grandson. Multiple occasions in which the police have been involved recently. Reports he feels fine today but has had some bad days recently due to stress.   Encouraged compliance with cardiac medications. Advised that transmission will be reviewed by Dr. Caryl Comes. Will call with any recommendations. Reviewed [place] DMV driving restrictions x6 months. Pt verbalizes understanding. No further questions at this time.  Routed to Dr. Caryl Comes for review and recommendations.

## 2019-12-30 ENCOUNTER — Other Ambulatory Visit: Payer: Self-pay | Admitting: *Deleted

## 2019-12-30 ENCOUNTER — Other Ambulatory Visit: Payer: Self-pay

## 2019-12-30 DIAGNOSIS — I472 Ventricular tachycardia, unspecified: Secondary | ICD-10-CM

## 2019-12-30 DIAGNOSIS — I255 Ischemic cardiomyopathy: Secondary | ICD-10-CM

## 2019-12-30 DIAGNOSIS — I5042 Chronic combined systolic (congestive) and diastolic (congestive) heart failure: Secondary | ICD-10-CM

## 2019-12-30 DIAGNOSIS — E7800 Pure hypercholesterolemia, unspecified: Secondary | ICD-10-CM

## 2019-12-30 DIAGNOSIS — I2510 Atherosclerotic heart disease of native coronary artery without angina pectoris: Secondary | ICD-10-CM

## 2019-12-30 NOTE — Telephone Encounter (Signed)
Spoke with patient. He agrees to have labs drawn today at the [facility] office. Added to schedule. Pt reports he also needs labs per A. Duke, PA. Advised he can have BMET, Mg, CBC, and TSH drawn today, but will need to be fasting for lipids. Pt aware and wishes to come back another time for lipids. No additional questions at this time.

## 2019-12-30 NOTE — Telephone Encounter (Signed)
Gregory [name]  Can we arrange for BMET and mg   Thanks SK

## 2019-12-31 LAB — CBC
Hematocrit: 42.9 % (ref 37.5–51.0)
Hemoglobin: 14.9 g/dL (ref 13.0–17.7)
MCH: 34 pg — ABNORMAL HIGH (ref 26.6–33.0)
MCHC: 34.7 g/dL (ref 31.5–35.7)
MCV: 98 fL — ABNORMAL HIGH (ref 79–97)
Platelets: 222 10*3/uL (ref 150–450)
RBC: 4.38 x10E6/uL (ref 4.14–5.80)
RDW: 13.1 % (ref 11.6–15.4)
WBC: 7.3 10*3/uL (ref 3.4–10.8)

## 2019-12-31 LAB — BASIC METABOLIC PANEL
BUN/Creatinine Ratio: 10 (ref 10–24)
BUN: 9 mg/dL (ref 8–27)
CO2: 26 mmol/L (ref 20–29)
Calcium: 8.9 mg/dL (ref 8.6–10.2)
Chloride: 104 mmol/L (ref 96–106)
Creatinine, Ser: 0.87 mg/dL (ref 0.76–1.27)
GFR calc Af Amer: 108 mL/min/{1.73_m2} (ref >59)
GFR calc non Af Amer: 94 mL/min/{1.73_m2} (ref >59)
Glucose: 98 mg/dL (ref 65–99)
Potassium: 4.2 mmol/L (ref 3.5–5.2)
Sodium: 142 mmol/L (ref 134–144)

## 2019-12-31 LAB — LIPID PANEL
Chol/HDL Ratio: 3.7 ratio (ref 0.0–5.0)
Cholesterol, Total: 137 mg/dL (ref 100–199)
HDL: 37 mg/dL — ABNORMAL LOW (ref >39)
LDL Chol Calc (NIH): 85 mg/dL (ref 0–99)
Triglycerides: 78 mg/dL (ref 0–149)
VLDL Cholesterol Cal: 15 mg/dL (ref 5–40)

## 2019-12-31 LAB — TSH: TSH: 0.643 u[IU]/mL (ref 0.450–4.500)

## 2019-12-31 LAB — MAGNESIUM: Magnesium: 2.3 mg/dL (ref 1.6–2.3)

## 2020-01-04 ENCOUNTER — Telehealth: Payer: Self-pay

## 2020-01-04 NOTE — Telephone Encounter (Signed)
Spoke with patient. Patient will send remote transmission at this time. No concerns. Questions answered.

## 2020-01-04 NOTE — Telephone Encounter (Signed)
Followed up with Gregory [name] regarding transmission that came through. [employer] Gregory [name] that no new episodes of VT are present. Will make Gregory [name] aware. Patient denies any concerns. Questions answered.

## 2020-01-04 NOTE — Telephone Encounter (Signed)
-----   Message from [name], MD sent at 01/01/2020  5:31 PM EST ----- Remote reviewed. This remote is abnormal for treated VT  E  can we interroigate later this week and if still high volume VT , A can we arrange an urgent OV with me PLZ   Thanks SK

## 2020-01-04 NOTE — Telephone Encounter (Signed)
Transmission reviewed. 1 NST (detected as VT-NS), appears 8 beats of possible atrially-driven tachycardia. No other episodes or new alerts noted since 12/28/19 transmission. Patient has f/u with Dr. Caryl Comes on 01/06/20. Routed as FYI.

## 2020-01-06 ENCOUNTER — Encounter: Payer: Self-pay | Admitting: Internal Medicine

## 2020-01-06 ENCOUNTER — Other Ambulatory Visit: Payer: Self-pay

## 2020-01-06 ENCOUNTER — Ambulatory Visit (INDEPENDENT_AMBULATORY_CARE_PROVIDER_SITE_OTHER): Payer: Worker's Compensation | Admitting: Internal Medicine

## 2020-01-06 VITALS — BP 138/90 | HR 84 | Ht 78.0 in | Wt 296.0 lb

## 2020-01-06 DIAGNOSIS — I472 Ventricular tachycardia, unspecified: Secondary | ICD-10-CM

## 2020-01-06 DIAGNOSIS — Z95810 Presence of automatic (implantable) cardiac defibrillator: Secondary | ICD-10-CM | POA: Diagnosis not present

## 2020-01-06 DIAGNOSIS — I255 Ischemic cardiomyopathy: Secondary | ICD-10-CM | POA: Diagnosis not present

## 2020-01-06 DIAGNOSIS — E7800 Pure hypercholesterolemia, unspecified: Secondary | ICD-10-CM | POA: Diagnosis not present

## 2020-01-06 LAB — HEPATIC FUNCTION PANEL
ALT: 11 IU/L (ref 0–44)
AST: 11 IU/L (ref 0–40)
Albumin: 4 g/dL (ref 3.8–4.8)
Alkaline Phosphatase: 66 IU/L (ref 39–117)
Bilirubin Total: 0.3 mg/dL (ref 0.0–1.2)
Bilirubin, Direct: 0.09 mg/dL (ref 0.00–0.40)
Total Protein: 6.6 g/dL (ref 6.0–8.5)

## 2020-01-06 MED ORDER — METOPROLOL SUCCINATE ER 25 MG PO TB24: 25.0000 mg | ORAL_TABLET | Freq: Every day | ORAL | 3 refills | Status: DC

## 2020-01-06 NOTE — Progress Notes (Signed)
Patient Care Team: Patient, No Pcp Per as PCP - General (General Practice) Croitoru, [name], MD as PCP - Cardiology (Cardiology)   HPI  [facility] Gregory [name] is a 59 y.o. male Seen in followup for ventricular tachycardia associated with a previously implanted ICD in the context of congestive heart failure ischemic cardiomyopathy. Ejection fraction echo 2014--45-50% and Myoview 2010 extensive inferolateral scar. He had a type I have talked with  underwent ICD generator replacement 1/18  He presented 5/17 and  found to be in sustained monomorphic ventricular tachycardia rate of 160 below his detection rate. His device was reprogrammed to 150.  He is seen today because of the recent episode of ventricular tachycardia terminated in his FVT zone during charging.  He was unaware of the event.  He has had a great deal of psychosocial stress of late.  This relates to issues with his granddaughter and her mother.  It caused him to start drinking.  He has now stopped.  No chest pain.  No shortness of breath.  No edema.  No palpitations.  Multiple other episodes of tachycardia were noted which were repaired by the device as VT NS DATE TEST EF   3/14 Echo   45-50 % LVH mod/ BAE  5/17 LHC   LADm-stent-p;D1-stent-p; CxOM1-stent-p;RCAp-T, collaterals  5/17 Echo  60-65%   6/19 Echo  60-65% LAE (56/2.1/45)     He's been managed by combination of amiodarone and mexiletine, the latter stopped 2/2 flatus  Was on amiodarone; it was stopped 2/20 (by me) because of an elevated alk phos.  No repeats are available yet. Date TSH LFTs Cr K Hgb  5/17       3.3   9/17  2.24 21  5.1   7/19 1.408 16 1.08 4.0 11.4<<8.4  1/21 0.643  0.87 4.2 14.9   Has a history of seizures ventricular tachycardia         Past Medical History:  Diagnosis Date  . CAD (coronary artery disease) 12/01/2013  . Chronic combined systolic and diastolic CHF, NYHA class 1 ([place]) 12/01/2013  . Erectile dysfunction  12/01/2013  . HTN (hypertension) 12/01/2013  . Hyperlipidemia 12/01/2013  . Ischemic cardiomyopathy 12/01/2013  . Sleep apnea     Past Surgical History:  Procedure Laterality Date  . ACHILLES TENDON REPAIR     lft foot  . CARDIAC CATHETERIZATION N/A 05/05/2016   Procedure: Left Heart Cath and Coronary Angiography;  Surgeon: Leonie Man, MD;  Location: [place] CV [facility];  Service: Cardiovascular;  Laterality: N/A;  . CARDIAC DEFIBRILLATOR PLACEMENT    . EP IMPLANTABLE DEVICE N/A 12/19/2016   Procedure: ICD Generator Changeout;  Surgeon: [name], MD;  Location: [place] [facility];  Service: Cardiovascular;  Laterality: N/A;  . WRIST TENODESIS      Current Outpatient Medications  Medication Sig Dispense Refill  . aspirin 81 MG tablet Take 81 mg by mouth daily.    Marland Kitchen atorvastatin (LIPITOR) 80 MG tablet TAKE ONE TABLET BY MOUTH EVERY EVENING 90 tablet 3  . clopidogrel (PLAVIX) 75 MG tablet TAKE 1 TABLET(75 MG) BY MOUTH DAILY 90 tablet 2  . COREG CR 80 MG 24 hr capsule Take 1 capsule (80 mg total) by mouth daily. 90 capsule 3  . ENTRESTO 97-103 MG TAKE 1 TABLET BY MOUTH TWICE DAILY 60 tablet 10  . FEROSUL 325 (65 Fe) MG tablet TAKE 1 TABLET BY MOUTH DAILY WITH BREAKFAST 60 tablet 1  . hydrochlorothiazide (HYDRODIURIL)  25 MG tablet TAKE 1 TABLET(25 MG) BY MOUTH DAILY 90 tablet 1  . levETIRAcetam (KEPPRA) 500 MG tablet Take 1 tablet (500 mg total) by mouth 2 (two) times daily. 60 tablet 11  . pantoprazole (PROTONIX) 40 MG tablet TAKE 1 TABLET(40 MG) BY MOUTH DAILY 30 tablet 11  . polyethylene glycol (MIRALAX / GLYCOLAX) packet Take 17 g by mouth daily as needed for moderate constipation or severe constipation. 14 each 1  . potassium chloride (K-DUR) 10 MEQ tablet TAKE 1 TABLET BY MOUTH EVERY DAY 90 tablet 3  . sildenafil (VIAGRA) 50 MG tablet TAKE ONE TABLET BY MOUTH DAILY AS NEEDED FOR ERECTILE DYSFUNCTION 15 tablet 0   No current facility-administered medications for this visit.        No Known Allergies  Review of Systems negative except from HPI and PMH  Physical Exam BP 138/90   Pulse 84   Ht '6\' 6"'  (1.981 m)   Wt 296 lb (134.3 kg)   SpO2 98%   BMI 34.21 kg/m  Well developed and nourished in no acute distress HENT normal Neck supple with JVP-flat Clear Regular rate and rhythm, no murmurs or gallops Abd-soft with active BS No Clubbing cyanosis edema Skin-warm and dry A & Oriented  Grossly normal sensory and motor function   ECG     sinus at 84 Interval 23/11/39 PVCs   Assessment and plan PVCs infrequent  Ventricular tachycardia recurrent treated with ATP  ICD  medtronic  The patient's device was interrogated.  The information was reviewed. No changes were made in the programming.     Ischemic Cardiomyopathy    CHF chronic systolic    Seizure disorder  Supraventricular tachycardia-nonsustained (atrial tachycardia)  Psychosocial stress   elevated alk phos  Amiodarone on hold secondary to elevated alk phos  Interval ventricular tachycardia treated with antitachycardia pacing.  Amiodarone has been on hold for a year.  This related to abnormal alkaline phosphatase.  GI evaluation anticipated never occurred.  He has remained off of amiodarone.  We will recheck alkaline phosphatase today.  No symptoms of ischemia.  Significant psychosocial stress however related to his events with his granddaughter.  Indulges excessively with alcohol but is currently not drinking.  Euvolemic continue current meds

## 2020-01-06 NOTE — Patient Instructions (Addendum)
Medication Instructions: Your physician has recommended you make the following change in your medication:   Start Metoprolol 25mg  one tablet daily by mouth  Labwork: Liver panel today  Testing/Procedures: None ordered.  Follow-Up: Your physician wants you to follow-up in: one year. You will receive a reminder letter in the mail two months in advance. If you don't receive a letter, please call our office to schedule the follow-up appointment.  Remote monitoring is used to monitor your Pacemaker of ICD from home. This monitoring reduces the number of office visits required to check your device to one time per year. It allows [place] to keep an eye on the functioning of your device to ensure it is working properly.   Any Other Special Instructions Will Be Listed Below (If Applicable).  If you need a refill on your cardiac medications before your next appointment, please call your pharmacy.

## 2020-01-09 ENCOUNTER — Telehealth: Payer: Self-pay

## 2020-01-09 NOTE — Telephone Encounter (Signed)
**Note De-Identified  Obfuscation** I called [employer] at [phone] and s/w Casie concerning a Metoprolol PA for this pt. Per Juanetta Snow this is a workers Engineer, manufacturing and that it has to go to an IT consultant for approval not the MD. Juanetta Snow states that often times the pharmacy will request a PA from the MD but there is nothing a MD can do for a workers comp claim.

## 2020-01-17 ENCOUNTER — Encounter: Payer: No Typology Code available for payment source | Admitting: Student

## 2020-01-30 ENCOUNTER — Other Ambulatory Visit: Payer: Self-pay

## 2020-02-13 ENCOUNTER — Telehealth: Payer: Self-pay | Admitting: *Deleted

## 2020-02-13 NOTE — Telephone Encounter (Signed)
Prior authorizations received for Entresto, plavix and potassium with a note to call [employer]- [phone] OPTION 1. Per the pharmacist, all three medications have been approved.

## 2020-03-07 ENCOUNTER — Other Ambulatory Visit: Payer: Self-pay | Admitting: Cardiovascular Disease

## 2020-03-28 ENCOUNTER — Ambulatory Visit (INDEPENDENT_AMBULATORY_CARE_PROVIDER_SITE_OTHER): Payer: Worker's Compensation | Admitting: *Deleted

## 2020-03-28 DIAGNOSIS — I472 Ventricular tachycardia, unspecified: Secondary | ICD-10-CM

## 2020-03-28 LAB — CUP PACEART REMOTE DEVICE CHECK
Battery Remaining Longevity: 86 mo
Battery Voltage: 3 V
Brady Statistic AP VP Percent: 0.41 %
Brady Statistic AP VS Percent: 24.48 %
Brady Statistic AS VP Percent: 0.16 %
Brady Statistic AS VS Percent: 74.95 %
Brady Statistic RA Percent Paced: 22.48 %
Brady Statistic RV Percent Paced: 0.54 %
Date Time Interrogation Session: 20210414001804
HighPow Impedance: 50 Ohm
HighPow Impedance: 60 Ohm
Implantable Lead Implant Date: 20051120
Implantable Lead Implant Date: 20101117
Implantable Lead Location: 753859
Implantable Lead Location: 753860
Implantable Lead Model: 5076
Implantable Lead Model: 7121
Implantable Pulse Generator Implant Date: 20180105
Lead Channel Impedance Value: 342 Ohm
Lead Channel Impedance Value: 418 Ohm
Lead Channel Impedance Value: 456 Ohm
Lead Channel Pacing Threshold Amplitude: 0.625 V
Lead Channel Pacing Threshold Amplitude: 1 V
Lead Channel Pacing Threshold Pulse Width: 0.4 ms
Lead Channel Pacing Threshold Pulse Width: 0.4 ms
Lead Channel Sensing Intrinsic Amplitude: 19.25 mV
Lead Channel Sensing Intrinsic Amplitude: 19.25 mV
Lead Channel Sensing Intrinsic Amplitude: 4.25 mV
Lead Channel Sensing Intrinsic Amplitude: 4.25 mV
Lead Channel Setting Pacing Amplitude: 1.5 V
Lead Channel Setting Pacing Amplitude: 2 V
Lead Channel Setting Pacing Pulse Width: 0.4 ms
Lead Channel Setting Sensing Sensitivity: 0.6 mV

## 2020-03-28 NOTE — Progress Notes (Signed)
ICD Remote  

## 2020-03-29 MED ORDER — SILDENAFIL CITRATE 50 MG PO TABS: 50.0000 mg | ORAL_TABLET | ORAL | 4 refills | Status: DC | PRN

## 2020-03-30 ENCOUNTER — Ambulatory Visit: Payer: No Typology Code available for payment source | Attending: Internal Medicine

## 2020-03-30 DIAGNOSIS — Z23 Encounter for immunization: Secondary | ICD-10-CM

## 2020-03-30 NOTE — Progress Notes (Signed)
   Covid-19 Vaccination Clinic  Name:  Gregory [name]    MRN: [id number] DOB: 01/21/1961  03/30/2020  [name] was observed post Covid-19 immunization for 15 minutes without incident. He was provided with Vaccine Information Sheet and instruction to access the V-Safe system.   [name] was instructed to call 911 with any severe reactions post vaccine: Marland Kitchen Difficulty breathing  . Swelling of face and throat  . A fast heartbeat  . A bad rash all over body  . Dizziness and weakness   Immunizations Administered    Name Date Dose VIS Date Route   Pfizer COVID-19 Vaccine 03/30/2020 10:00 AM 0.3 mL 11/25/2019 Intramuscular   Manufacturer: [place]   Lot: [id number]   [place]: [id number]

## 2020-04-01 ENCOUNTER — Other Ambulatory Visit: Payer: Self-pay | Admitting: Cardiology

## 2020-04-09 ENCOUNTER — Ambulatory Visit: Payer: No Typology Code available for payment source | Admitting: Neurology

## 2020-04-23 ENCOUNTER — Ambulatory Visit: Payer: No Typology Code available for payment source | Attending: Internal Medicine

## 2020-04-23 DIAGNOSIS — Z23 Encounter for immunization: Secondary | ICD-10-CM

## 2020-04-23 NOTE — Progress Notes (Signed)
   Covid-19 Vaccination Clinic  Name:  Cayle Bailey    MRN: [id number] DOB: 03/02/1961  04/23/2020  Mr. Lagow was observed post Covid-19 immunization for 15 minutes without incident. He was provided with Vaccine Information Sheet and instruction to access the V-Safe system.   Mr. Chatwin was instructed to call 911 with any severe reactions post vaccine: Marland Kitchen Difficulty breathing  . Swelling of face and throat  . A fast heartbeat  . A bad rash all over body  . Dizziness and weakness   Immunizations Administered    Name Date Dose VIS Date Route   Pfizer COVID-19 Vaccine 04/23/2020 11:00 AM 0.3 mL 02/08/2019 Intramuscular   Manufacturer: [place]   Lot: [id number]   [place]: [id number]

## 2020-05-07 ENCOUNTER — Telehealth: Payer: Self-pay | Admitting: Emergency Medicine

## 2020-05-07 NOTE — Telephone Encounter (Signed)
Patient aware that per [facility] DMV regulations that he is not to drive for 6 months after treatment by ICD. His ast episode of VT treated successfully by ATP X 1 was on 01/04/20 and patient aware that he should not drive until [id number].

## 2020-05-07 NOTE — Telephone Encounter (Signed)
-----   Message from [name], MD sent at 05/04/2020  8:24 PM EDT ----- Remote reviewed. This remote is abnormal for interval VT treated with ATP  Please, C can you call him and discuss driving   Thanks SK

## 2020-05-15 ENCOUNTER — Other Ambulatory Visit: Payer: Self-pay | Admitting: Cardiovascular Disease

## 2020-05-17 ENCOUNTER — Other Ambulatory Visit: Payer: Self-pay | Admitting: Cardiovascular Disease

## 2020-06-16 ENCOUNTER — Other Ambulatory Visit: Payer: Self-pay | Admitting: Cardiovascular Disease

## 2020-06-27 ENCOUNTER — Ambulatory Visit (INDEPENDENT_AMBULATORY_CARE_PROVIDER_SITE_OTHER): Payer: Worker's Compensation | Admitting: *Deleted

## 2020-06-27 DIAGNOSIS — I472 Ventricular tachycardia, unspecified: Secondary | ICD-10-CM

## 2020-06-27 LAB — CUP PACEART REMOTE DEVICE CHECK
Battery Remaining Longevity: 88 mo
Battery Voltage: 2.99 V
Brady Statistic AP VP Percent: 0.3 %
Brady Statistic AP VS Percent: 20.36 %
Brady Statistic AS VP Percent: 0.13 %
Brady Statistic AS VS Percent: 79.21 %
Brady Statistic RA Percent Paced: 19.37 %
Brady Statistic RV Percent Paced: 0.43 %
Date Time Interrogation Session: 20210714022824
HighPow Impedance: 47 Ohm
HighPow Impedance: 60 Ohm
Implantable Lead Implant Date: 20051120
Implantable Lead Implant Date: 20101117
Implantable Lead Location: 753859
Implantable Lead Location: 753860
Implantable Lead Model: 5076
Implantable Lead Model: 7121
Implantable Pulse Generator Implant Date: 20180105
Lead Channel Impedance Value: 342 Ohm
Lead Channel Impedance Value: 418 Ohm
Lead Channel Impedance Value: 418 Ohm
Lead Channel Pacing Threshold Amplitude: 0.5 V
Lead Channel Pacing Threshold Amplitude: 0.875 V
Lead Channel Pacing Threshold Pulse Width: 0.4 ms
Lead Channel Pacing Threshold Pulse Width: 0.4 ms
Lead Channel Sensing Intrinsic Amplitude: 16.5 mV
Lead Channel Sensing Intrinsic Amplitude: 16.5 mV
Lead Channel Sensing Intrinsic Amplitude: 4.125 mV
Lead Channel Sensing Intrinsic Amplitude: 4.125 mV
Lead Channel Setting Pacing Amplitude: 1.5 V
Lead Channel Setting Pacing Amplitude: 2 V
Lead Channel Setting Pacing Pulse Width: 0.4 ms
Lead Channel Setting Sensing Sensitivity: 0.6 mV

## 2020-06-28 NOTE — Progress Notes (Signed)
Remote ICD transmission.   

## 2020-08-15 ENCOUNTER — Other Ambulatory Visit: Payer: Self-pay | Admitting: Neurology

## 2020-08-16 NOTE — Telephone Encounter (Signed)
Needs f/u for further refills, pls schedule f/u and we can send refills until his schedule f/u, thanks

## 2020-08-16 NOTE — Telephone Encounter (Signed)
LVM letting pt know he needs a f/u to have meds refilled. Went ahead and sch pt for first avail 03/20/21.

## 2020-08-20 ENCOUNTER — Other Ambulatory Visit: Payer: Self-pay | Admitting: Cardiovascular Disease

## 2020-09-09 ENCOUNTER — Emergency Department (HOSPITAL_COMMUNITY): Payer: Worker's Compensation

## 2020-09-09 ENCOUNTER — Inpatient Hospital Stay (HOSPITAL_COMMUNITY)
Admission: EM | Admit: 2020-09-09 | Discharge: 2020-09-29 | DRG: 286 | Disposition: A | Discharge status: Rehabilitation Facility | Payer: Worker's Compensation | Attending: Internal Medicine | Admitting: Internal Medicine

## 2020-09-09 DIAGNOSIS — G40909 Epilepsy, unspecified, not intractable, without status epilepticus: Secondary | ICD-10-CM | POA: Diagnosis not present

## 2020-09-09 DIAGNOSIS — E861 Hypovolemia: Secondary | ICD-10-CM | POA: Diagnosis present

## 2020-09-09 DIAGNOSIS — I5031 Acute diastolic (congestive) heart failure: Secondary | ICD-10-CM | POA: Diagnosis not present

## 2020-09-09 DIAGNOSIS — Z8249 Family history of ischemic heart disease and other diseases of the circulatory system: Secondary | ICD-10-CM

## 2020-09-09 DIAGNOSIS — I5043 Acute on chronic combined systolic (congestive) and diastolic (congestive) heart failure: Secondary | ICD-10-CM | POA: Diagnosis not present

## 2020-09-09 DIAGNOSIS — I509 Heart failure, unspecified: Secondary | ICD-10-CM

## 2020-09-09 DIAGNOSIS — G479 Sleep disorder, unspecified: Secondary | ICD-10-CM | POA: Diagnosis not present

## 2020-09-09 DIAGNOSIS — Z955 Presence of coronary angioplasty implant and graft: Secondary | ICD-10-CM

## 2020-09-09 DIAGNOSIS — I10 Essential (primary) hypertension: Secondary | ICD-10-CM | POA: Diagnosis not present

## 2020-09-09 DIAGNOSIS — N390 Urinary tract infection, site not specified: Secondary | ICD-10-CM | POA: Diagnosis not present

## 2020-09-09 DIAGNOSIS — I5042 Chronic combined systolic (congestive) and diastolic (congestive) heart failure: Secondary | ICD-10-CM | POA: Diagnosis present

## 2020-09-09 DIAGNOSIS — R0781 Pleurodynia: Secondary | ICD-10-CM

## 2020-09-09 DIAGNOSIS — D696 Thrombocytopenia, unspecified: Secondary | ICD-10-CM | POA: Diagnosis not present

## 2020-09-09 DIAGNOSIS — R21 Rash and other nonspecific skin eruption: Secondary | ICD-10-CM | POA: Diagnosis not present

## 2020-09-09 DIAGNOSIS — R531 Weakness: Secondary | ICD-10-CM | POA: Diagnosis present

## 2020-09-09 DIAGNOSIS — K8020 Calculus of gallbladder without cholecystitis without obstruction: Secondary | ICD-10-CM | POA: Diagnosis present

## 2020-09-09 DIAGNOSIS — N179 Acute kidney failure, unspecified: Secondary | ICD-10-CM | POA: Diagnosis present

## 2020-09-09 DIAGNOSIS — I951 Orthostatic hypotension: Secondary | ICD-10-CM | POA: Diagnosis present

## 2020-09-09 DIAGNOSIS — Z87891 Personal history of nicotine dependence: Secondary | ICD-10-CM

## 2020-09-09 DIAGNOSIS — I493 Ventricular premature depolarization: Secondary | ICD-10-CM | POA: Diagnosis not present

## 2020-09-09 DIAGNOSIS — I361 Nonrheumatic tricuspid (valve) insufficiency: Secondary | ICD-10-CM | POA: Diagnosis not present

## 2020-09-09 DIAGNOSIS — G4733 Obstructive sleep apnea (adult) (pediatric): Secondary | ICD-10-CM | POA: Diagnosis present

## 2020-09-09 DIAGNOSIS — E119 Type 2 diabetes mellitus without complications: Secondary | ICD-10-CM | POA: Diagnosis present

## 2020-09-09 DIAGNOSIS — R6521 Severe sepsis with septic shock: Secondary | ICD-10-CM | POA: Diagnosis not present

## 2020-09-09 DIAGNOSIS — Z9861 Coronary angioplasty status: Secondary | ICD-10-CM | POA: Diagnosis not present

## 2020-09-09 DIAGNOSIS — I4901 Ventricular fibrillation: Secondary | ICD-10-CM | POA: Diagnosis present

## 2020-09-09 DIAGNOSIS — E871 Hypo-osmolality and hyponatremia: Secondary | ICD-10-CM | POA: Diagnosis present

## 2020-09-09 DIAGNOSIS — Z23 Encounter for immunization: Secondary | ICD-10-CM

## 2020-09-09 DIAGNOSIS — I2510 Atherosclerotic heart disease of native coronary artery without angina pectoris: Secondary | ICD-10-CM | POA: Diagnosis not present

## 2020-09-09 DIAGNOSIS — A419 Sepsis, unspecified organism: Secondary | ICD-10-CM | POA: Diagnosis not present

## 2020-09-09 DIAGNOSIS — G40009 Localization-related (focal) (partial) idiopathic epilepsy and epileptic syndromes with seizures of localized onset, not intractable, without status epilepticus: Secondary | ICD-10-CM | POA: Diagnosis present

## 2020-09-09 DIAGNOSIS — E7800 Pure hypercholesterolemia, unspecified: Secondary | ICD-10-CM | POA: Diagnosis present

## 2020-09-09 DIAGNOSIS — K5901 Slow transit constipation: Secondary | ICD-10-CM | POA: Diagnosis not present

## 2020-09-09 DIAGNOSIS — K7200 Acute and subacute hepatic failure without coma: Secondary | ICD-10-CM | POA: Diagnosis present

## 2020-09-09 DIAGNOSIS — I472 Ventricular tachycardia, unspecified: Secondary | ICD-10-CM

## 2020-09-09 DIAGNOSIS — Z6836 Body mass index (BMI) 36.0-36.9, adult: Secondary | ICD-10-CM

## 2020-09-09 DIAGNOSIS — Z95810 Presence of automatic (implantable) cardiac defibrillator: Secondary | ICD-10-CM | POA: Diagnosis not present

## 2020-09-09 DIAGNOSIS — I25118 Atherosclerotic heart disease of native coronary artery with other forms of angina pectoris: Secondary | ICD-10-CM

## 2020-09-09 DIAGNOSIS — R5381 Other malaise: Secondary | ICD-10-CM | POA: Diagnosis not present

## 2020-09-09 DIAGNOSIS — E878 Other disorders of electrolyte and fluid balance, not elsewhere classified: Secondary | ICD-10-CM | POA: Diagnosis present

## 2020-09-09 DIAGNOSIS — R569 Unspecified convulsions: Secondary | ICD-10-CM | POA: Diagnosis not present

## 2020-09-09 DIAGNOSIS — Z7902 Long term (current) use of antithrombotics/antiplatelets: Secondary | ICD-10-CM

## 2020-09-09 DIAGNOSIS — R571 Hypovolemic shock: Secondary | ICD-10-CM | POA: Diagnosis present

## 2020-09-09 DIAGNOSIS — I5082 Biventricular heart failure: Secondary | ICD-10-CM | POA: Diagnosis not present

## 2020-09-09 DIAGNOSIS — D509 Iron deficiency anemia, unspecified: Secondary | ICD-10-CM | POA: Diagnosis present

## 2020-09-09 DIAGNOSIS — I4819 Other persistent atrial fibrillation: Secondary | ICD-10-CM | POA: Diagnosis not present

## 2020-09-09 DIAGNOSIS — I2582 Chronic total occlusion of coronary artery: Secondary | ICD-10-CM | POA: Diagnosis present

## 2020-09-09 DIAGNOSIS — D62 Acute posthemorrhagic anemia: Secondary | ICD-10-CM | POA: Diagnosis not present

## 2020-09-09 DIAGNOSIS — E876 Hypokalemia: Secondary | ICD-10-CM | POA: Diagnosis present

## 2020-09-09 DIAGNOSIS — E860 Dehydration: Secondary | ICD-10-CM | POA: Diagnosis not present

## 2020-09-09 DIAGNOSIS — R579 Shock, unspecified: Principal | ICD-10-CM

## 2020-09-09 DIAGNOSIS — M549 Dorsalgia, unspecified: Secondary | ICD-10-CM | POA: Diagnosis present

## 2020-09-09 DIAGNOSIS — R7309 Other abnormal glucose: Secondary | ICD-10-CM | POA: Diagnosis not present

## 2020-09-09 DIAGNOSIS — I5032 Chronic diastolic (congestive) heart failure: Secondary | ICD-10-CM | POA: Diagnosis not present

## 2020-09-09 DIAGNOSIS — I4891 Unspecified atrial fibrillation: Secondary | ICD-10-CM | POA: Diagnosis present

## 2020-09-09 DIAGNOSIS — E8809 Other disorders of plasma-protein metabolism, not elsewhere classified: Secondary | ICD-10-CM | POA: Diagnosis present

## 2020-09-09 DIAGNOSIS — E872 Acidosis: Secondary | ICD-10-CM | POA: Diagnosis present

## 2020-09-09 DIAGNOSIS — R197 Diarrhea, unspecified: Secondary | ICD-10-CM | POA: Diagnosis present

## 2020-09-09 DIAGNOSIS — Z5320 Procedure and treatment not carried out because of patient's decision for unspecified reasons: Secondary | ICD-10-CM | POA: Diagnosis not present

## 2020-09-09 DIAGNOSIS — Z79899 Other long term (current) drug therapy: Secondary | ICD-10-CM

## 2020-09-09 DIAGNOSIS — G9341 Metabolic encephalopathy: Secondary | ICD-10-CM | POA: Diagnosis not present

## 2020-09-09 DIAGNOSIS — I255 Ischemic cardiomyopathy: Secondary | ICD-10-CM | POA: Diagnosis present

## 2020-09-09 DIAGNOSIS — F419 Anxiety disorder, unspecified: Secondary | ICD-10-CM | POA: Diagnosis present

## 2020-09-09 DIAGNOSIS — Z808 Family history of malignant neoplasm of other organs or systems: Secondary | ICD-10-CM

## 2020-09-09 DIAGNOSIS — I480 Paroxysmal atrial fibrillation: Secondary | ICD-10-CM | POA: Diagnosis not present

## 2020-09-09 DIAGNOSIS — E785 Hyperlipidemia, unspecified: Secondary | ICD-10-CM | POA: Diagnosis present

## 2020-09-09 DIAGNOSIS — J9811 Atelectasis: Secondary | ICD-10-CM | POA: Diagnosis present

## 2020-09-09 DIAGNOSIS — Z20822 Contact with and (suspected) exposure to covid-19: Secondary | ICD-10-CM | POA: Diagnosis not present

## 2020-09-09 DIAGNOSIS — K7460 Unspecified cirrhosis of liver: Secondary | ICD-10-CM

## 2020-09-09 DIAGNOSIS — G4700 Insomnia, unspecified: Secondary | ICD-10-CM | POA: Diagnosis present

## 2020-09-09 DIAGNOSIS — Z7982 Long term (current) use of aspirin: Secondary | ICD-10-CM

## 2020-09-09 DIAGNOSIS — G8929 Other chronic pain: Secondary | ICD-10-CM | POA: Diagnosis present

## 2020-09-09 DIAGNOSIS — D1779 Benign lipomatous neoplasm of other sites: Secondary | ICD-10-CM | POA: Diagnosis not present

## 2020-09-09 DIAGNOSIS — E46 Unspecified protein-calorie malnutrition: Secondary | ICD-10-CM | POA: Diagnosis not present

## 2020-09-09 DIAGNOSIS — I110 Hypertensive heart disease with heart failure: Secondary | ICD-10-CM | POA: Diagnosis present

## 2020-09-09 DIAGNOSIS — E669 Obesity, unspecified: Secondary | ICD-10-CM | POA: Diagnosis present

## 2020-09-09 LAB — COMPREHENSIVE METABOLIC PANEL
ALT: 35 U/L (ref 0–44)
ALT: 34 U/L (ref 0–44)
AST: 88 U/L — ABNORMAL HIGH (ref 15–41)
AST: 83 U/L — ABNORMAL HIGH (ref 15–41)
Albumin: 2 g/dL — ABNORMAL LOW (ref 3.5–5.0)
Albumin: 2 g/dL — ABNORMAL LOW (ref 3.5–5.0)
Alkaline Phosphatase: 44 U/L (ref 38–126)
Alkaline Phosphatase: 41 U/L (ref 38–126)
Anion gap: 19 — ABNORMAL HIGH (ref 5–15)
Anion gap: 16 — ABNORMAL HIGH (ref 5–15)
BUN: 55 mg/dL — ABNORMAL HIGH (ref 8–23)
BUN: 54 mg/dL — ABNORMAL HIGH (ref 8–23)
CO2: 16 mmol/L — ABNORMAL LOW (ref 22–32)
CO2: 18 mmol/L — ABNORMAL LOW (ref 22–32)
Calcium: 5.2 mg/dL — CL (ref 8.9–10.3)
Calcium: 5.4 mg/dL — CL (ref 8.9–10.3)
Chloride: 95 mmol/L — ABNORMAL LOW (ref 98–111)
Chloride: 98 mmol/L (ref 98–111)
Creatinine, Ser: 5.6 mg/dL — ABNORMAL HIGH (ref 0.61–1.24)
Creatinine, Ser: 4.48 mg/dL — ABNORMAL HIGH (ref 0.61–1.24)
GFR calc Af Amer: 12 mL/min — ABNORMAL LOW (ref >60)
GFR calc Af Amer: 15 mL/min — ABNORMAL LOW (ref >60)
GFR calc non Af Amer: 10 mL/min — ABNORMAL LOW (ref >60)
GFR calc non Af Amer: 13 mL/min — ABNORMAL LOW (ref >60)
Glucose, Bld: 189 mg/dL — ABNORMAL HIGH (ref 70–99)
Glucose, Bld: 187 mg/dL — ABNORMAL HIGH (ref 70–99)
Potassium: 3.1 mmol/L — ABNORMAL LOW (ref 3.5–5.1)
Potassium: 3.4 mmol/L — ABNORMAL LOW (ref 3.5–5.1)
Sodium: 130 mmol/L — ABNORMAL LOW (ref 135–145)
Sodium: 132 mmol/L — ABNORMAL LOW (ref 135–145)
Total Bilirubin: 1.3 mg/dL — ABNORMAL HIGH (ref 0.3–1.2)
Total Bilirubin: 1.2 mg/dL (ref 0.3–1.2)
Total Protein: 4.6 g/dL — ABNORMAL LOW (ref 6.5–8.1)
Total Protein: 4.6 g/dL — ABNORMAL LOW (ref 6.5–8.1)

## 2020-09-09 LAB — CBC WITH DIFFERENTIAL/PLATELET
Abs Immature Granulocytes: 0.11 10*3/uL — ABNORMAL HIGH (ref 0.00–0.07)
Basophils Absolute: 0.1 10*3/uL (ref 0.0–0.1)
Basophils Relative: 1 %
Eosinophils Absolute: 0 10*3/uL (ref 0.0–0.5)
Eosinophils Relative: 0 %
HCT: 48.3 % (ref 39.0–52.0)
Hemoglobin: 16.6 g/dL (ref 13.0–17.0)
Immature Granulocytes: 1 %
Lymphocytes Relative: 10 %
Lymphs Abs: 1.1 10*3/uL (ref 0.7–4.0)
MCH: 33.9 pg (ref 26.0–34.0)
MCHC: 34.4 g/dL (ref 30.0–36.0)
MCV: 98.8 fL (ref 80.0–100.0)
Monocytes Absolute: 0.6 10*3/uL (ref 0.1–1.0)
Monocytes Relative: 5 %
Neutro Abs: 8.7 10*3/uL — ABNORMAL HIGH (ref 1.7–7.7)
Neutrophils Relative %: 83 %
Platelets: UNDETERMINED 10*3/uL (ref 150–400)
RBC: 4.89 MIL/uL (ref 4.22–5.81)
RDW: 15.6 % — ABNORMAL HIGH (ref 11.5–15.5)
WBC: 10.6 10*3/uL — ABNORMAL HIGH (ref 4.0–10.5)
nRBC: 0.8 % — ABNORMAL HIGH (ref 0.0–0.2)

## 2020-09-09 LAB — I-STAT ARTERIAL BLOOD GAS, ED
Acid-base deficit: 6 mmol/L — ABNORMAL HIGH (ref 0.0–2.0)
Bicarbonate: 16.6 mmol/L — ABNORMAL LOW (ref 20.0–28.0)
Calcium, Ion: 0.78 mmol/L — CL (ref 1.15–1.40)
HCT: 46 % (ref 39.0–52.0)
Hemoglobin: 15.6 g/dL (ref 13.0–17.0)
O2 Saturation: 96 %
Patient temperature: 100
Potassium: 3 mmol/L — ABNORMAL LOW (ref 3.5–5.1)
Sodium: 132 mmol/L — ABNORMAL LOW (ref 135–145)
TCO2: 17 mmol/L — ABNORMAL LOW (ref 22–32)
pCO2 arterial: 25.8 mmHg — ABNORMAL LOW (ref 32.0–48.0)
pH, Arterial: 7.419 (ref 7.350–7.450)
pO2, Arterial: 83 mmHg (ref 83.0–108.0)

## 2020-09-09 LAB — LACTIC ACID, PLASMA
Lactic Acid, Venous: 3.2 mmol/L (ref 0.5–1.9)
Lactic Acid, Venous: 3.2 mmol/L (ref 0.5–1.9)
Lactic Acid, Venous: 2.9 mmol/L (ref 0.5–1.9)

## 2020-09-09 LAB — RESPIRATORY PANEL BY RT PCR (FLU A&B, COVID)
Influenza A by PCR: NEGATIVE
Influenza B by PCR: NEGATIVE
SARS Coronavirus 2 by RT PCR: NEGATIVE

## 2020-09-09 LAB — CBC
HCT: 44.4 % (ref 39.0–52.0)
Hemoglobin: 15.3 g/dL (ref 13.0–17.0)
MCH: 33.2 pg (ref 26.0–34.0)
MCHC: 34.5 g/dL (ref 30.0–36.0)
MCV: 96.3 fL (ref 80.0–100.0)
Platelets: 76 10*3/uL — ABNORMAL LOW (ref 150–400)
RBC: 4.61 MIL/uL (ref 4.22–5.81)
RDW: 15.2 % (ref 11.5–15.5)
WBC: 10.9 10*3/uL — ABNORMAL HIGH (ref 4.0–10.5)
nRBC: 0.9 % — ABNORMAL HIGH (ref 0.0–0.2)

## 2020-09-09 LAB — PHOSPHORUS: Phosphorus: 4.5 mg/dL (ref 2.5–4.6)

## 2020-09-09 LAB — URINALYSIS, ROUTINE W REFLEX MICROSCOPIC
Glucose, UA: NEGATIVE mg/dL
Ketones, ur: NEGATIVE mg/dL
Leukocytes,Ua: NEGATIVE
Nitrite: NEGATIVE
Protein, ur: 100 mg/dL — AB
Specific Gravity, Urine: 1.02 (ref 1.005–1.030)
pH: 5 (ref 5.0–8.0)

## 2020-09-09 LAB — HIV ANTIBODY (ROUTINE TESTING W REFLEX): HIV Screen 4th Generation wRfx: NONREACTIVE

## 2020-09-09 LAB — PROCALCITONIN: Procalcitonin: 0.7 ng/mL

## 2020-09-09 LAB — TROPONIN I (HIGH SENSITIVITY): Troponin I (High Sensitivity): 48 ng/L — ABNORMAL HIGH (ref <18)

## 2020-09-09 LAB — CORTISOL: Cortisol, Plasma: 57.6 ug/dL

## 2020-09-09 LAB — GLUCOSE, CAPILLARY
Glucose-Capillary: 167 mg/dL — ABNORMAL HIGH (ref 70–99)
Glucose-Capillary: 216 mg/dL — ABNORMAL HIGH (ref 70–99)

## 2020-09-09 LAB — STREP PNEUMONIAE URINARY ANTIGEN: Strep Pneumo Urinary Antigen: NEGATIVE

## 2020-09-09 LAB — MAGNESIUM
Magnesium: 1.6 mg/dL — ABNORMAL LOW (ref 1.7–2.4)
Magnesium: 1.4 mg/dL — ABNORMAL LOW (ref 1.7–2.4)

## 2020-09-09 LAB — AMYLASE: Amylase: 98 U/L (ref 28–100)

## 2020-09-09 LAB — PROTIME-INR
INR: 1.4 — ABNORMAL HIGH (ref 0.8–1.2)
Prothrombin Time: 16.5 seconds — ABNORMAL HIGH (ref 11.4–15.2)

## 2020-09-09 LAB — APTT: aPTT: 33 seconds (ref 24–36)

## 2020-09-09 LAB — LIPASE, BLOOD: Lipase: 120 U/L — ABNORMAL HIGH (ref 11–51)

## 2020-09-09 MED ORDER — SODIUM CHLORIDE 0.9 % IV SOLN: INTRAVENOUS | Status: DC

## 2020-09-09 MED ORDER — LACTATED RINGERS IV BOLUS (SEPSIS)
500.0000 mL | Freq: Once | INTRAVENOUS | Status: AC
  Administered 2020-09-09: 500 mL via INTRAVENOUS

## 2020-09-09 MED ORDER — METRONIDAZOLE IN NACL 5-0.79 MG/ML-% IV SOLN
500.0000 mg | Freq: Once | INTRAVENOUS | Status: AC
  Administered 2020-09-09: 500 mg via INTRAVENOUS
  Filled 2020-09-09: qty 100

## 2020-09-09 MED ORDER — SODIUM CHLORIDE 0.9 % IV SOLN
INTRAVENOUS | Status: AC | PRN
  Administered 2020-09-09 (×2): 1000 mL via INTRAVENOUS

## 2020-09-09 MED ORDER — SODIUM CHLORIDE 0.9 % IV SOLN
2.0000 g | Freq: Once | INTRAVENOUS | Status: AC
  Administered 2020-09-09: 2 g via INTRAVENOUS
  Filled 2020-09-09: qty 2

## 2020-09-09 MED ORDER — LACTATED RINGERS IV BOLUS (SEPSIS)
1000.0000 mL | Freq: Once | INTRAVENOUS | Status: AC
  Administered 2020-09-09: 1000 mL via INTRAVENOUS

## 2020-09-09 MED ORDER — DOCUSATE SODIUM 100 MG PO CAPS
100.0000 mg | ORAL_CAPSULE | Freq: Two times a day (BID) | ORAL | Status: DC | PRN
  Administered 2020-09-14: 100 mg via ORAL
  Filled 2020-09-09: qty 1

## 2020-09-09 MED ORDER — POLYETHYLENE GLYCOL 3350 17 G PO PACK: 17.0000 g | PACK | Freq: Every day | ORAL | Status: DC | PRN

## 2020-09-09 MED ORDER — SODIUM CHLORIDE 0.9 % IV SOLN: 2.0000 g | INTRAVENOUS | Status: DC

## 2020-09-09 MED ORDER — NOREPINEPHRINE 4 MG/250ML-% IV SOLN
INTRAVENOUS | Status: AC
  Administered 2020-09-09: 35 ug/min
  Filled 2020-09-09: qty 250

## 2020-09-09 MED ORDER — AMIODARONE HCL IN DEXTROSE 360-4.14 MG/200ML-% IV SOLN
30.0000 mg/h | INTRAVENOUS | Status: DC
  Administered 2020-09-10 – 2020-09-13 (×7): 30 mg/h via INTRAVENOUS
  Filled 2020-09-09 (×8): qty 200

## 2020-09-09 MED ORDER — HEPARIN (PORCINE) 25000 UT/250ML-% IV SOLN
2600.0000 [IU]/h | INTRAVENOUS | Status: DC
  Administered 2020-09-09: 1750 [IU]/h via INTRAVENOUS
  Administered 2020-09-10 – 2020-09-13 (×5): 2300 [IU]/h via INTRAVENOUS
  Administered 2020-09-13 (×2): 2600 [IU]/h via INTRAVENOUS
  Filled 2020-09-09 (×11): qty 250

## 2020-09-09 MED ORDER — PANTOPRAZOLE SODIUM 40 MG IV SOLR
40.0000 mg | Freq: Every day | INTRAVENOUS | Status: DC
  Administered 2020-09-09 – 2020-09-13 (×5): 40 mg via INTRAVENOUS
  Filled 2020-09-09 (×5): qty 40

## 2020-09-09 MED ORDER — PROMETHAZINE HCL 25 MG/ML IJ SOLN
25.0000 mg | Freq: Once | INTRAMUSCULAR | Status: DC
  Filled 2020-09-09: qty 1

## 2020-09-09 MED ORDER — NOREPINEPHRINE 4 MG/250ML-% IV SOLN
INTRAVENOUS | Status: AC
  Filled 2020-09-09: qty 250

## 2020-09-09 MED ORDER — AMIODARONE HCL IN DEXTROSE 360-4.14 MG/200ML-% IV SOLN: 30.0000 mg/h | INTRAVENOUS | Status: DC

## 2020-09-09 MED ORDER — AMIODARONE HCL IN DEXTROSE 360-4.14 MG/200ML-% IV SOLN: 60.0000 mg/h | INTRAVENOUS | Status: DC

## 2020-09-09 MED ORDER — NOREPINEPHRINE 4 MG/250ML-% IV SOLN
INTRAVENOUS | Status: AC
  Administered 2020-09-09: 6 ug/min via INTRAVENOUS
  Filled 2020-09-09: qty 250

## 2020-09-09 MED ORDER — NOREPINEPHRINE 4 MG/250ML-% IV SOLN
INTRAVENOUS | Status: AC | PRN
  Administered 2020-09-09: 2 ug/min via INTRAVENOUS

## 2020-09-09 MED ORDER — HEPARIN SODIUM (PORCINE) 5000 UNIT/ML IJ SOLN: 5000.0000 [IU] | Freq: Three times a day (TID) | INTRAMUSCULAR | Status: DC

## 2020-09-09 MED ORDER — LACTATED RINGERS IV BOLUS: 1000.0000 mL | Freq: Once | INTRAVENOUS | Status: DC

## 2020-09-09 MED ORDER — MAGNESIUM SULFATE 4 GM/100ML IV SOLN
4.0000 g | Freq: Once | INTRAVENOUS | Status: AC
  Administered 2020-09-09: 4 g via INTRAVENOUS
  Filled 2020-09-09: qty 100

## 2020-09-09 MED ORDER — AMIODARONE HCL IN DEXTROSE 360-4.14 MG/200ML-% IV SOLN
60.0000 mg/h | INTRAVENOUS | Status: AC
  Administered 2020-09-09: 60 mg/h via INTRAVENOUS
  Filled 2020-09-09 (×2): qty 200

## 2020-09-09 MED ORDER — HEPARIN BOLUS VIA INFUSION
7000.0000 [IU] | Freq: Once | INTRAVENOUS | Status: AC
  Administered 2020-09-09: 7000 [IU] via INTRAVENOUS
  Filled 2020-09-09: qty 7000

## 2020-09-09 NOTE — ED Notes (Signed)
Report given to Leory Plowman, RN on [facility]

## 2020-09-09 NOTE — ED Triage Notes (Signed)
BIB [facility] after pt called to report fall that he had the previous night. PT also reports feeling dizzy and having diarrhea over the last few days.

## 2020-09-09 NOTE — ED Provider Notes (Signed)
[facility] EMERGENCY DEPARTMENT Provider Note   CSN: [id number] Arrival date & time: 09/09/20  1251     History Chief Complaint  Patient presents with  . Tachycardia  . Hypotension    Gregory [name] is a 59 y.o. male.  HPI   Patient is a 59 year old male, he has known coronary disease with combined systolic and diastolic congestive heart failure. He has hypertension hyperlipidemia and a known ischemic cardiomyopathy.  The patient presents to the hospital today with generalized weakness. He states that over the last several days he has been getting lightheaded when he tries to stand up, yesterday he actually passed out and fell when he tried to stand up. Today because of ongoing weakness and some shortness of breath he was brought to the hospital by paramedics.  Additional history obtained from the paramedics is that the patient has been having diarrhea occasionally over the last week, he states it has been black at times but does take iron and this is not unusual for him. They reported that he had long runs of ventricular tachycardia that self converted and has since been given amiodarone on the ambulance had only short runs of V. tach. The patient does have a known implantable defibrillator, the patient states this has not fired today.  No other medications given prehospital.  Additional history obtained from the medical record, the generator for his defibrillator was replaced 3 years ago in January 2018. Last ejection fraction in 2019 was 60 to 65%    Past Medical History:  Diagnosis Date  . CAD (coronary artery disease) 12/01/2013  . Chronic combined systolic and diastolic CHF, NYHA class 1 ([place]) 12/01/2013  . Erectile dysfunction 12/01/2013  . HTN (hypertension) 12/01/2013  . Hyperlipidemia 12/01/2013  . Ischemic cardiomyopathy 12/01/2013  . Sleep apnea     Patient Active Problem List   Diagnosis Date Noted  . Localization-related  idiopathic epilepsy and epileptic syndromes with seizures of localized onset, not intractable, without status epilepticus ([place]) 10/20/2018  . Seizure disorder ([place]) 06/07/2018  . Anemia 06/07/2018  . Ventricular tachycardia ([place]) 05/04/2016  . Ischemic cardiomyopathy 12/01/2013  . Chronic combined systolic and diastolic heart failure ([place]) 12/01/2013  . CAD S/P percutaneous coronary angioplasty 12/01/2013  . Erectile dysfunction 12/01/2013  . Essential hypertension 12/01/2013  . Hypercholesterolemia 12/01/2013  . ICD - dual chamber [employer] programmed VVI 12/01/2013  . OSA on CPAP 12/01/2013    Past Surgical History:  Procedure Laterality Date  . ACHILLES TENDON REPAIR     lft foot  . CARDIAC CATHETERIZATION N/A 05/05/2016   Procedure: Left Heart Cath and Coronary Angiography;  Surgeon: Leonie Man, MD;  Location: [facility];  Service: Cardiovascular;  Laterality: N/A;  . CARDIAC DEFIBRILLATOR PLACEMENT    . EP IMPLANTABLE DEVICE N/A 12/19/2016   Procedure: ICD Generator Changeout;  Surgeon: [name], MD;  Location: [facility];  Service: Cardiovascular;  Laterality: N/A;  . WRIST TENODESIS         Family History  Problem Relation Age of Onset  . Heart disease Mother   . Brain cancer Father   . Hypertension Daughter     Social History   Tobacco Use  . Smoking status: Former Smoker    Packs/day: 0.00    Years: 30.00    Pack years: 0.00    Types: Cigarettes    Quit date: 05/31/2013    Years since quitting: 7.2  . Smokeless tobacco: Never Used  Vaping Use  . Vaping Use: Never used  Substance Use Topics  . Alcohol use: No    Comment: ocassionally  . Drug use: Never    Home Medications Prior to Admission medications   Medication Sig Start Date End Date Taking? Authorizing Provider  aspirin 81 MG tablet Take 81 mg by mouth daily.   Yes [provider]  atorvastatin (LIPITOR) 80 MG tablet TAKE ONE TABLET BY MOUTH EVERY EVENING Patient  taking differently: Take 80 mg by mouth daily.  10/25/19  Yes Croitoru, [name], MD  clopidogrel (PLAVIX) 75 MG tablet TAKE 1 TABLET(75 MG) BY MOUTH DAILY Patient taking differently: Take 75 mg by mouth daily.  03/07/20  Yes Duke, Tami Lin, PA  COREG CR 80 MG 24 hr capsule Take 1 capsule (80 mg total) by mouth daily. 11/14/19  Yes Croitoru, [name], MD  ENTRESTO 97-103 MG TAKE 1 TABLET BY MOUTH TWICE DAILY Patient taking differently: Take 1 tablet by mouth 2 (two) times daily.  11/18/19  Yes Croitoru, [name], MD  FEROSUL 325 (65 Fe) MG tablet TAKE 1 TABLET BY MOUTH DAILY WITH BREAKFAST Patient taking differently: Take 325 mg by mouth daily with breakfast.  10/27/18  Yes Ladell Pier, MD  hydrochlorothiazide (HYDRODIURIL) 25 MG tablet TAKE 1 TABLET(25 MG) BY MOUTH DAILY Patient taking differently: Take 25 mg by mouth daily.  08/21/20  Yes Troy Sine, MD  levETIRAcetam (KEPPRA) 500 MG tablet TAKE 1 TABLET(500 MG) BY MOUTH TWICE DAILY Patient taking differently: Take 500 mg by mouth 2 (two) times daily.  08/16/20  Yes [name], MD  metoprolol succinate (TOPROL XL) 25 MG 24 hr tablet Take 1 tablet (25 mg total) by mouth daily. 01/06/20  Yes [name], MD  pantoprazole (PROTONIX) 40 MG tablet TAKE 1 TABLET(40 MG) BY MOUTH DAILY Patient taking differently: Take 40 mg by mouth daily.  04/02/20  Yes Kilroy, Luke K, PA-C  polyethylene glycol (MIRALAX / GLYCOLAX) packet Take 17 g by mouth daily as needed for moderate constipation or severe constipation. 06/09/18  Yes Amin, Ankit Chirag, MD  potassium chloride (KLOR-CON) 10 MEQ tablet TAKE 1 TABLET BY MOUTH EVERY DAY Patient taking differently: Take 10 mEq by mouth daily.  06/18/20  Yes Croitoru, [name], MD  sildenafil (VIAGRA) 50 MG tablet Take 1 tablet (50 mg total) by mouth as needed for erectile dysfunction. 03/29/20  Yes Croitoru, Dani Gobble, MD    Allergies    Patient has no known allergies.  Review of Systems   Review of Systems  All  other systems reviewed and are negative.   Physical Exam Updated Vital Signs BP (!) 80/48   Pulse (!) 40   Resp 16   Ht 1.981 m (6\' 6" )   Wt 129.3 kg   SpO2 94%   BMI 32.94 kg/m   Physical Exam Vitals and nursing note reviewed.  Constitutional:      General: He is in acute distress.     Appearance: He is well-developed. He is ill-appearing.  HENT:     Head: Normocephalic and atraumatic.     Mouth/Throat:     Pharynx: No oropharyngeal exudate.  Eyes:     General: No scleral icterus.       Right eye: No discharge.        Left eye: No discharge.     Conjunctiva/sclera: Conjunctivae normal.     Pupils: Pupils are equal, round, and reactive to light.  Neck:     Thyroid: No thyromegaly.  Vascular: No JVD.  Cardiovascular:     Rate and Rhythm: Tachycardia present. Rhythm irregular.     Heart sounds: Normal heart sounds. No murmur heard.  No friction rub. No gallop.      Comments: Appears to be in atrial fibrillation with underlying runs of ventricular tachycardia lasting 2-3 beats Pulmonary:     Effort: Pulmonary effort is normal. No respiratory distress.     Breath sounds: Normal breath sounds. No wheezing or rales.  Chest:     Chest wall: No tenderness.  Abdominal:     General: Bowel sounds are normal. There is no distension.     Palpations: Abdomen is soft. There is no mass.     Tenderness: There is no abdominal tenderness.  Musculoskeletal:        General: No tenderness. Normal range of motion.     Cervical back: Normal range of motion and neck supple.  Lymphadenopathy:     Cervical: No cervical adenopathy.  Skin:    General: Skin is dry.     Findings: No erythema or rash.     Comments: Cold to the touch  Neurological:     Mental Status: He is alert.     Coordination: Coordination normal.     Comments: Awake alert and following commands, severely generally weak  Psychiatric:        Behavior: Behavior normal.     ED Results / Procedures / Treatments    Labs (all labs ordered are listed, but only abnormal results are displayed) Labs Reviewed  CBC WITH DIFFERENTIAL/PLATELET - Abnormal; Notable for the following components:      Result Value   WBC 10.6 (*)    RDW 15.6 (*)    nRBC 0.8 (*)    Neutro Abs 8.7 (*)    Abs Immature Granulocytes 0.11 (*)    All other components within normal limits  COMPREHENSIVE METABOLIC PANEL - Abnormal; Notable for the following components:   Sodium 130 (*)    Potassium 3.1 (*)    Chloride 95 (*)    CO2 16 (*)    Glucose, Bld 189 (*)    BUN 55 (*)    Creatinine, Ser 5.60 (*)    Calcium 5.2 (*)    Total Protein 4.6 (*)    Albumin 2.0 (*)    AST 88 (*)    Total Bilirubin 1.3 (*)    GFR calc non Af Amer 10 (*)    GFR calc Af Amer 12 (*)    Anion gap 19 (*)    All other components within normal limits  LACTIC ACID, PLASMA - Abnormal; Notable for the following components:   Lactic Acid, Venous 3.2 (*)    All other components within normal limits  MAGNESIUM - Abnormal; Notable for the following components:   Magnesium 1.6 (*)    All other components within normal limits  TROPONIN I (HIGH SENSITIVITY) - Abnormal; Notable for the following components:   Troponin I (High Sensitivity) 48 (*)    All other components within normal limits  URINE CULTURE  RESPIRATORY PANEL BY RT PCR (FLU A&B, COVID)  URINALYSIS, ROUTINE W REFLEX MICROSCOPIC  LACTIC ACID, PLASMA    EKG EKG Interpretation  Date/Time:  Sunday September 09 2020 13:00:25 EDT Ventricular Rate:  116 PR Interval:    QRS Duration: 124 QT Interval:  338 QTC Calculation: 470 R Axis:   136 Text Interpretation: Atrial fibrillation Nonspecific intraventricular conduction delay Anterolateral infarct, age indeterminate Since last tracing ST  segments abnormal diffusely, prior Q waves still preasent Confirmed by Noemi Chapel [phone]) on 09/09/2020 1:38:32 PM   Radiology DG Chest Port 1 View  Result Date: 09/09/2020 CLINICAL DATA:  Shortness of  breath and tachycardia EXAM: PORTABLE CHEST 1 VIEW COMPARISON:  05/04/2016 FINDINGS: Cardiac shadow is enlarged but stable. Defibrillator is again noted. The lungs are clear bilaterally. No bony abnormality is seen. IMPRESSION: No acute abnormality noted. Electronically Signed   By: Inez Catalina M.D.   On: 09/09/2020 13:39    Procedures .Critical Care Performed by: Noemi Chapel, MD Authorized by: Noemi Chapel, MD   Critical care provider statement:    Critical care time (minutes):  35   Critical care time was exclusive of:  Separately billable procedures and treating other patients and teaching time   Critical care was necessary to treat or prevent imminent or life-threatening deterioration of the following conditions:  Circulatory failure and shock   Critical care was time spent personally by me on the following activities:  Blood draw for specimens, development of treatment plan with patient or surrogate, discussions with consultants, evaluation of patient's response to treatment, examination of patient, obtaining history from patient or surrogate, ordering and performing treatments and interventions, ordering and review of laboratory studies, ordering and review of radiographic studies, pulse oximetry, re-evaluation of patient's condition and review of old charts Comments:        .Central Line  Date/Time: 09/09/2020 1:41 PM Performed by: Noemi Chapel, MD Authorized by: Noemi Chapel, MD   Consent:    Consent obtained:  Verbal   Consent given by:  Patient   Risks discussed:  Bleeding and arterial puncture   Alternatives discussed:  Alternative treatment Universal protocol:    Procedure explained and questions answered to patient or proxy's satisfaction: yes     Test results available and properly labeled: yes     Imaging studies available: yes     Required blood products, implants, devices, and special equipment available: yes     Site/side marked: yes     Immediately prior to  procedure, a time out was called: yes     Patient identity confirmed:  Verbally with patient Pre-procedure details:    Hand hygiene: Hand hygiene performed prior to insertion     Sterile barrier technique: All elements of maximal sterile technique followed     Skin preparation:  ChloraPrep   Skin preparation agent: Skin preparation agent completely dried prior to procedure   Anesthesia (see MAR for exact dosages):    Anesthesia method:  Local infiltration   Local anesthetic:  Lidocaine 1% w/o epi Procedure details:    Location:  R femoral   Patient position:  Flat   Procedural supplies:  Triple lumen   Catheter size:  7 Fr   Landmarks identified: yes     Ultrasound guidance: no     Number of attempts:  1   Successful placement: yes   Post-procedure details:    Post-procedure:  Dressing applied and line sutured   Assessment:  Blood return through all ports and free fluid flow   Patient tolerance of procedure:  Tolerated well, no immediate complications Comments:          (including critical care time)  Medications Ordered in ED Medications  0.9 %  sodium chloride infusion ( Intravenous Not Given 09/09/20 1358)  lactated ringers bolus 1,000 mL (1,000 mLs Intravenous New Bag/Given 09/09/20 1411)    And  lactated ringers bolus 1,000 mL (has no administration in  time range)    And  lactated ringers bolus 1,000 mL (has no administration in time range)    And  lactated ringers bolus 1,000 mL (has no administration in time range)    And  lactated ringers bolus 500 mL (has no administration in time range)  promethazine (PHENERGAN) injection 25 mg (has no administration in time range)  ceFEPIme (MAXIPIME) 2 g in sodium chloride 0.9 % 100 mL IVPB (has no administration in time range)  ceFEPIme (MAXIPIME) 2 g in sodium chloride 0.9 % 100 mL IVPB (0 g Intravenous Stopped 09/09/20 1516)  metroNIDAZOLE (FLAGYL) IVPB 500 mg (500 mg Intravenous New Bag/Given 09/09/20 1424)  norepinephrine  (LEVOPHED) 4mg  in 256mL premix infusion (20 mcg/min Intravenous Rate/Dose Change 09/09/20 1522)  0.9 %  sodium chloride infusion ( Intravenous Stopped 09/09/20 1431)    ED Course  I have reviewed the triage vital signs and the nursing notes.  Pertinent labs & imaging results that were available during my care of the patient were reviewed by me and considered in my medical decision making (see chart for details).    MDM Rules/Calculators/A&P                          This patient has had worsening condition with regards to his cardiopulmonary status. He is tachycardic, hypotensive between 65 and 70 systolic, no recent fevers or coughing or shortness of breath per se. He has lightheadedness and syncope is likely related to underlying hypotension. He does not recall being cardioverted or defibrillated. The patient has received some IV fluids prehospital but at this time is going to need more IV fluids and possibly vasopressors. He has known ischemic cardiomyopathy, will obtain EKG troponin and labs. I have been unable to access peripheral IV access, may need central access if nursing unable to obtain same.  EKG obtained which shows atrial fibrillation with rapid ventricular rate, there are Q waves present in the inferior leads similar to prior EKGs, ST abnormalities are present diffusely but does not seem to be in an anatomic distribution.  The patient remained hypotensive, palpable pulses were unable to be obtained distally, manual blood pressure showed systolic of 68, Levophed was started through peripheral IV in addition to IV fluids and a central line was placed in the right femoral vein without complication or bleeding.  Chest x-ray reveals no obvious infiltrate, there does appear to be some cardiomegaly.  Labs have been sent, the patient is on Levophed doing better.  His mental status remains clear, he is diffusely generally weak.  Whether this patient is in shock from a cardiogenic source,  infectious source or purely hypovolemic from fluid losses unclear however there is an acute kidney injury likely from lack of perfusion. Urinalysis pending, the patient cannot make urine, will place a Foley catheter to obtain. This will help to monitor urinary output. He has acute kidney failure, he is in shock, he has been treated with antibiotics, he will be admitted to the intensive care unit. I spoke with the intensivist, they will come to admit. I also spoke with Dr. Debara Pickett of the cardiology service and they will consult. At this time on Levophed the patient's blood pressure is 80 systolic and heart rate is in the low 100s still in atrial fibrillation.   Final Clinical Impression(s) / ED Diagnoses Final diagnoses:  Shock ([place])  Acute renal failure, unspecified acute renal failure type ([place])  Hyponatremia  Hypocalcemia    Rx /  DC Orders ED Discharge Orders    None       Noemi Chapel, MD 09/09/20 [phone]

## 2020-09-09 NOTE — ED Notes (Signed)
Attempted to call report again and the phone just rang and rang.

## 2020-09-09 NOTE — H&P (Signed)
NAME:  Gregory [name], MRN:  [id number], DOB:  04-05-1961, LOS: 0 ADMISSION DATE:  09/09/2020, CONSULTATION DATE: 09/09/2020 REFERRING MD: Emergency part physician, CHIEF COMPLAINT: Near syncopal episode questionable V. tach  Brief History   59 year old extensive cardiac history 3 days nausea vomiting diarrhea  History of present illness   Gregory [name] is a 59 year old male with extensive past medical history includes his coronary disease, diastolic systolic heart failure he has an implanted automatic cardiac defibrillator for V. Tach. He reports to be in his usual state of health until approximately 3 days ago which time he developed nausea vomiting and diarrhea he presents to [facility] after being treated with amiodarone for suspected V. tach there is I AICD did not shock him.  He is also on multiple diuretics including Hydrocort thiazide along with St[place].  He was noted to have creatinine elevation most likely from dehydration and taking of diuretics.  He is awake alert not on oxygen with sats of 100% but requiring low-dose Levophed for blood pressure support.  We will continue to IV fluid resuscitate him as he does have elevated lactic acid check a procalcitonin and make decision whether to continue antimicrobial therapy.  Past Medical History   Past Medical History:  Diagnosis Date  . CAD (coronary artery disease) 12/01/2013  . Chronic combined systolic and diastolic CHF, NYHA class 1 ([place]) 12/01/2013  . Erectile dysfunction 12/01/2013  . HTN (hypertension) 12/01/2013  . Hyperlipidemia 12/01/2013  . Ischemic cardiomyopathy 12/01/2013  . Sleep apnea      Significant Hospital Events   09/09/2020 admit to the intensive care unit  Consults:  09/09/2020 cardiology  Procedures:  Right femoral central line per EDP  Significant Diagnostic Tests:  09/09/2020 implant interrogation by cardiology  Micro Data:  09/09/2020 blood cultures x2 09/09/2020 urine culture 09/09/2020  procalcitonin  Antimicrobials:  09/09/2020 Flagyl 09/09/2020 cefepime  Interim history/subjective:  59 year old with significant past medical history of coronary disease who presents with nausea vomiting diarrhea and acute renal failure.  Objective   Blood pressure (!) 80/48, pulse (!) 40, resp. rate 16, height 6\' 6"  (1.981 m), weight 129.3 kg, SpO2 94 %.        Intake/Output Summary (Last 24 hours) at 09/09/2020 1625 Last data filed at 09/09/2020 1609 Gross per 24 hour  Intake 2200 ml  Output --  Net 2200 ml   Filed Weights   09/09/20 1520  Weight: 129.3 kg    Examination: General: Obese male who is alert and orientated no acute distress HENT: HEENT is unremarkable.  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 Admit to the intensive care unit for closer monitoring Cardiology consult to interrogate AICD Fluid rehydration as he has several days nausea vomiting diarrhea Continue to follow laboratory data.  Acute renal failure in the setting of multiple diuretics for congestive heart failure coupled with nausea vomiting diarrhea for several days leading to dehydration. Lab Results  Component Value Date   CREATININE 5.60 (H) 09/09/2020   CREATININE 0.87 12/30/2019   CREATININE 1.19 11/18/2018   CREATININE 1.08 12/12/2016   CREATININE 1.02 08/26/2016   CREATININE 1.11 05/21/2016   Continue  fluid resuscitation Monitor creatinine Avoid diuretics Avoid nephrotoxins  Questionable component of sepsis possible from a GI source. Check procalcitonin Lactic acid  elevated 3.24 IV fluids follow He did receive 1 dose of Maxipime and Flagyl in the emergency room.  We will hold on further antibiotics for procalcitonin  Nausea vomiting diarrhea of unknown etiology Antiemetics Continue to monitor  Best practice:   Diet: NPO Pain/Anxiety/Delirium protocol (if indicated): As needed VAP protocol (if indicated): Not indicated DVT prophylaxis: Heparin GI prophylaxis: PPI Glucose control: Sliding scale insulin protocol Mobility: Bedrest Code Status: Full Family Communication: None 26 2021 patient updated at bedside Disposition: Admit to the ICU  Labs   CBC: Recent Labs  Lab 09/09/20 1336  WBC 10.6*  NEUTROABS 8.7*  HGB 16.6  HCT 48.3  MCV 98.8  PLT PLATELET CLUMPS NOTED ON SMEAR, UNABLE TO ESTIMATE    Basic Metabolic Panel: Recent Labs  Lab 09/09/20 1336  NA 130*  K 3.1*  CL 95*  CO2 16*  GLUCOSE 189*  BUN 55*  CREATININE 5.60*  CALCIUM 5.2*  MG 1.6*   GFR: Estimated Creatinine Clearance: 20.9 mL/min (A) (by C-G formula based on SCr of 5.6 mg/dL (H)). Recent Labs  Lab 09/09/20 1336  WBC 10.6*  LATICACIDVEN 3.2*    Liver Function Tests: Recent Labs  Lab 09/09/20 1336  AST 88*  ALT 35  ALKPHOS 44  BILITOT 1.3*  PROT 4.6*  ALBUMIN 2.0*   No results for input(s): LIPASE, AMYLASE in the last 168 hours. No results for input(s): AMMONIA in the last 168 hours.  ABG No results found for: PHART, PCO2ART, PO2ART, HCO3, TCO2, ACIDBASEDEF, O2SAT   Coagulation Profile: No results for input(s): INR, PROTIME in the last 168 hours.  Cardiac Enzymes: No results for input(s): CKTOTAL, CKMB, CKMBINDEX, TROPONINI in the last 168 hours.  HbA1C: Hgb A1c MFr Bld  Date/Time Value Ref Range Status  02/28/2014 08:37 AM 5.7 (H) <5.7 % Final    Comment:                                                                           According to the ADA Clinical Practice Recommendations for 2011, when HbA1c is used as a screening test:      >=6.5%   Diagnostic of Diabetes Mellitus            (if abnormal result is confirmed)   5.7-6.4%   Increased risk of developing Diabetes Mellitus   References:Diagnosis and Classification of Diabetes Mellitus,Diabetes ZOXW,9604,54(UJWJX 1):S62-S69 and Standards of Medical Care in         Diabetes - 2011,Diabetes BJYN,8295,62 (Suppl 1):S11-S61.    10/29/2009 07:44 PM  4.6 - 6.1 % Final   5.7 (NOTE) The ADA recommends the following therapeutic goal for glycemic control related to Hgb A1c measurement: Goal of therapy: <6.5 Hgb A1c  Reference: American Diabetes Association: Clinical Practice Recommendations 2010, Diabetes Care, 2010, 33: (Suppl  1).    CBG: No results for input(s): GLUCAP in the last 168 hours.  Review of Systems:   10 point review of system taken, please see HPI for positives and negatives. \  Past Medical History  He,  has a past medical history of CAD (  coronary artery disease) (12/01/2013), Chronic combined systolic and diastolic CHF, NYHA class 1 ([place]) (12/01/2013), Erectile dysfunction (12/01/2013), HTN (hypertension) (12/01/2013), Hyperlipidemia (12/01/2013), Ischemic cardiomyopathy (12/01/2013), and Sleep apnea.   Surgical History    Past Surgical History:  Procedure Laterality Date  . ACHILLES TENDON REPAIR     lft foot  . CARDIAC CATHETERIZATION N/A 05/05/2016   Procedure: Left Heart Cath and Coronary Angiography;  Surgeon: Leonie Man, MD;  Location: [place] [facility];  Service: Cardiovascular;  Laterality: N/A;  . CARDIAC DEFIBRILLATOR PLACEMENT    . EP IMPLANTABLE DEVICE N/A 12/19/2016   Procedure: ICD Generator Changeout;  Surgeon: [name], MD;  Location: [facility];  Service: Cardiovascular;  Laterality: N/A;  . WRIST TENODESIS       Social History   reports that he quit smoking about 7 years ago. His smoking use included cigarettes. He smoked 0.00 packs per day for 30.00 years. He has never used smokeless tobacco. He reports  that he does not drink alcohol and does not use drugs.   Family History   His family history includes Brain cancer in his father; Heart disease in his mother; Hypertension in his daughter.   Allergies No Known Allergies   Home Medications  Prior to Admission medications   Medication Sig Start Date End Date Taking? Authorizing Provider  aspirin 81 MG tablet Take 81 mg by mouth daily.   Yes [provider]  atorvastatin (LIPITOR) 80 MG tablet TAKE ONE TABLET BY MOUTH EVERY EVENING Patient taking differently: Take 80 mg by mouth daily.  10/25/19  Yes Croitoru, [name], MD  clopidogrel (PLAVIX) 75 MG tablet TAKE 1 TABLET(75 MG) BY MOUTH DAILY Patient taking differently: Take 75 mg by mouth daily.  03/07/20  Yes Duke, Tami Lin, PA  COREG CR 80 MG 24 hr capsule Take 1 capsule (80 mg total) by mouth daily. 11/14/19  Yes Croitoru, [name], MD  ENTRESTO 97-103 MG TAKE 1 TABLET BY MOUTH TWICE DAILY Patient taking differently: Take 1 tablet by mouth 2 (two) times daily.  11/18/19  Yes Croitoru, [name], MD  FEROSUL 325 (65 Fe) MG tablet TAKE 1 TABLET BY MOUTH DAILY WITH BREAKFAST Patient taking differently: Take 325 mg by mouth daily with breakfast.  10/27/18  Yes Ladell Pier, MD  hydrochlorothiazide (HYDRODIURIL) 25 MG tablet TAKE 1 TABLET(25 MG) BY MOUTH DAILY Patient taking differently: Take 25 mg by mouth daily.  08/21/20  Yes Troy Sine, MD  levETIRAcetam (KEPPRA) 500 MG tablet TAKE 1 TABLET(500 MG) BY MOUTH TWICE DAILY Patient taking differently: Take 500 mg by mouth 2 (two) times daily.  08/16/20  Yes [name], MD  metoprolol succinate (TOPROL XL) 25 MG 24 hr tablet Take 1 tablet (25 mg total) by mouth daily. 01/06/20  Yes [name], MD  pantoprazole (PROTONIX) 40 MG tablet TAKE 1 TABLET(40 MG) BY MOUTH DAILY Patient taking differently: Take 40 mg by mouth daily.  04/02/20  Yes Kilroy, Luke K, PA-C  polyethylene glycol (MIRALAX / GLYCOLAX) packet Take 17 g by mouth  daily as needed for moderate constipation or severe constipation. 06/09/18  Yes Amin, Ankit Chirag, MD  potassium chloride (KLOR-CON) 10 MEQ tablet TAKE 1 TABLET BY MOUTH EVERY DAY Patient taking differently: Take 10 mEq by mouth daily.  06/18/20  Yes Croitoru, [name], MD  sildenafil (VIAGRA) 50 MG tablet Take 1 tablet (50 mg total) by mouth as needed for erectile dysfunction. 03/29/20  Yes Croitoru, Dani Gobble, MD  Critical care time: [place] ACNP Acute Care Nurse Practitioner [place] Please consult Amion 09/09/2020, 4:25 PM

## 2020-09-09 NOTE — ED Notes (Signed)
Attempted to call report and was told the nurse will call back.

## 2020-09-09 NOTE — Consult Note (Signed)
Cardiology Consultation:   Patient ID: Gregory [name] MRN: [id number]; DOB: September 03, 1961  Admit date: 09/09/2020 Date of Consult: 09/09/2020  Primary Care Provider: Patient, No Pcp Per [facility] Cardiologist: Sanda Klein, MD  [facility] Electrophysiologist:  Jolyn Nap, MD   Patient Profile:   Gregory [name] is a 59 y.o. male with a hx of CAD, CHF (EF evaluation as low as 45%, but recently 60-65%) and VT s/p ICD who is being seen today for the evaluation of shock at the request of Dr. Sabra Heck.  History of Present Illness:   Gregory [name] has had longstanding cardiac issues stemming from ischemic CMP, generally well compensated CHF, but recurrent episodes of sustained VT (often with relatively slow rates).  He presents with generalized weakness and orthostatic dizziness, culminating in syncope today. He has had intermittent diarrhea.  P.o. intake has been poor, although he has been drinking fluids.  He states that he has not had more alcohol than usual, 1-2 drinks a day.  He has not had nausea or vomiting.  He denies abdominal pain.  He has not had fever, but did have some chills and drenching sweats over the last 2 days.  He has not had chest pain or ICD shocks. He is profoundly hypotensive. Labs are consistent with acute renal failure. There is mild elevation in lactic acid, he is hemoconcentrated. WBC is borderline high.  His blood pressure has improved with norepinephrine IV and he is receiving aggressive fluid resuscitation.  EMS witnessed lengthy episodes of "VT" that "self-converted" and initiated amiodarone with improvement. His rhythm is currently atrial fibrillation, ventricular rate 120-130s, frequent wide complex beats.  Interrogation of his defibrillator shows that he has been in uninterrupted paroxysmal atrial fibrillation with rapid ventricular response since roughly 8 AM yesterday 09/08/2020, roughly the last 31 hours.  Prior to the onset of atrial  fibrillation he had incessant episodes of regular atrial tachycardia with 1: 1 AV conduction, catalog by his device as representing ventricular tachycardia.  All the episodes for which electrograms are available for review are consistent with supraventricular origin.  Thankfully none of the episodes are long enough to trigger VT detection/therapies.  He last received treatment for ventricular tachycardia in January 2021 (burst pacing, no shock).  Total ventricular pacing is less than 0.5% and he only has 21% atrial pacing over the last 9 months.  Review of his thoracic impedance (OptiVol), shows a drastic increase in thoracic impedance consistent with sudden hypovolemia/dehydration the onset of which seems to precede the atrial fibrillation by a day or two.  He was previously treated with amiodarone for VT, stopped in 2020 due to elevation in alk phos. he has a history of  Past Medical History:  Diagnosis Date  . CAD (coronary artery disease) 12/01/2013  . Chronic combined systolic and diastolic CHF, NYHA class 1 ([place]) 12/01/2013  . Erectile dysfunction 12/01/2013  . HTN (hypertension) 12/01/2013  . Hyperlipidemia 12/01/2013  . Ischemic cardiomyopathy 12/01/2013  . Sleep apnea     Past Surgical History:  Procedure Laterality Date  . ACHILLES TENDON REPAIR     lft foot  . CARDIAC CATHETERIZATION N/A 05/05/2016   Procedure: Left Heart Cath and Coronary Angiography;  Surgeon: Leonie Man, MD;  Location: [place] [facility];  Service: Cardiovascular;  Laterality: N/A;  . CARDIAC DEFIBRILLATOR PLACEMENT    . EP IMPLANTABLE DEVICE N/A 12/19/2016   Procedure: ICD Generator Changeout;  Surgeon: [name], MD;  Location: [place] [facility];  Service:  Cardiovascular;  Laterality: N/A;  . WRIST TENODESIS       Home Medications:  Prior to Admission medications   Medication Sig Start Date End Date Taking? Authorizing Provider  aspirin 81 MG tablet Take 81 mg by mouth daily.   Yes [provider]  atorvastatin (LIPITOR) 80 MG tablet TAKE ONE TABLET BY MOUTH EVERY EVENING Patient taking differently: Take 80 mg by mouth daily.  10/25/19  Yes Leshon Armistead, MD  clopidogrel (PLAVIX) 75 MG tablet TAKE 1 TABLET(75 MG) BY MOUTH DAILY Patient taking differently: Take 75 mg by mouth daily.  03/07/20  Yes Duke, Tami Lin, PA  COREG CR 80 MG 24 hr capsule Take 1 capsule (80 mg total) by mouth daily. 11/14/19  Yes Malin Cervini, MD  ENTRESTO 97-103 MG TAKE 1 TABLET BY MOUTH TWICE DAILY Patient taking differently: Take 1 tablet by mouth 2 (two) times daily.  11/18/19  Yes Angeli Demilio, MD  FEROSUL 325 (65 Fe) MG tablet TAKE 1 TABLET BY MOUTH DAILY WITH BREAKFAST Patient taking differently: Take 325 mg by mouth daily with breakfast.  10/27/18  Yes Ladell Pier, MD  hydrochlorothiazide (HYDRODIURIL) 25 MG tablet TAKE 1 TABLET(25 MG) BY MOUTH DAILY Patient taking differently: Take 25 mg by mouth daily.  08/21/20  Yes Troy Sine, MD  levETIRAcetam (KEPPRA) 500 MG tablet TAKE 1 TABLET(500 MG) BY MOUTH TWICE DAILY Patient taking differently: Take 500 mg by mouth 2 (two) times daily.  08/16/20  Yes [name], MD  metoprolol succinate (TOPROL XL) 25 MG 24 hr tablet Take 1 tablet (25 mg total) by mouth daily. 01/06/20  Yes [name], MD  pantoprazole (PROTONIX) 40 MG tablet TAKE 1 TABLET(40 MG) BY MOUTH DAILY Patient taking differently: Take 40 mg by mouth daily.  04/02/20  Yes Kilroy, Luke K, PA-C  polyethylene glycol (MIRALAX / GLYCOLAX) packet Take 17 g by mouth daily as needed for moderate constipation or severe constipation. 06/09/18  Yes Amin, Ankit Chirag, MD  potassium chloride (KLOR-CON) 10 MEQ tablet TAKE 1 TABLET BY MOUTH EVERY DAY Patient taking differently: Take 10 mEq by mouth daily.  06/18/20  Yes Amazing Cowman, MD  sildenafil (VIAGRA) 50 MG tablet Take 1 tablet (50 mg total) by mouth as needed for erectile dysfunction. 03/29/20  Yes Carleena Mires, MD     Inpatient Medications: Scheduled Meds: . promethazine  25 mg Intravenous Once   Continuous Infusions: . sodium chloride    . [START ON 09/10/2020] ceFEPime (MAXIPIME) IV    . lactated ringers     And  . lactated ringers     And  . lactated ringers     And  . lactated ringers     PRN Meds:   Allergies:   No Known Allergies  Social History:   Social History   Socioeconomic History  . Marital status: Single    Spouse name: Not on file  . Number of children: Not on file  . Years of education: Not on file  . Highest education level: Not on file  Occupational History  . Not on file  Tobacco Use  . Smoking status: Former Smoker    Packs/day: 0.00    Years: 30.00    Pack years: 0.00    Types: Cigarettes    Quit date: 05/31/2013    Years since quitting: 7.2  . Smokeless tobacco: Never Used  Vaping Use  . Vaping Use: Never used  Substance and Sexual Activity  . Alcohol  use: No    Comment: ocassionally  . Drug use: Never  . Sexual activity: Not Currently  Other Topics Concern  . Not on file  Social History Narrative   Pt lies in split level home with his son, daughter and grand-son   Has 3 children   Some college education   Retired Scientist, forensic.    Right handed   Social Determinants of Health   Financial Resource Strain:   . Difficulty of Paying Living Expenses: Not on file  Food Insecurity:   . Worried About Charity fundraiser in the Last Year: Not on file  . Ran Out of Food in the Last Year: Not on file  Transportation Needs:   . Lack of Transportation (Medical): Not on file  . Lack of Transportation (Non-Medical): Not on file  Physical Activity:   . Days of Exercise per Week: Not on file  . Minutes of Exercise per Session: Not on file  Stress:   . Feeling of Stress : Not on file  Social Connections:   . Frequency of Communication with Friends and Family: Not on file  . Frequency of Social Gatherings with Friends and Family: Not on  file  . Attends Religious Services: Not on file  . Active Member of Clubs or Organizations: Not on file  . Attends Archivist Meetings: Not on file  . Marital Status: Not on file  Intimate Partner Violence:   . Fear of Current or Ex-Partner: Not on file  . Emotionally Abused: Not on file  . Physically Abused: Not on file  . Sexually Abused: Not on file    Family History:    Family History  Problem Relation Age of Onset  . Heart disease Mother   . Brain cancer Father   . Hypertension Daughter      ROS:  Please see the history of present illness.  All other ROS reviewed and negative.     Physical Exam/Data:   Vitals:   09/09/20 1400 09/09/20 1415 09/09/20 1515 09/09/20 1520  BP: (!) 72/49 (!) 70/55 (!) 71/50 (!) 80/48  Pulse: (!) 105 (!) 57 (!) 55 (!) 40  Resp: (!) 22 (!) 25 (!) 24 16  SpO2: 95% 95% 99% 94%  Weight:    129.3 kg  Height:    _0  (1.981 m)    Intake/Output Summary (Last 24 hours) at 09/09/2020 1557 Last data filed at 09/09/2020 1516 Gross per 24 hour  Intake 1100 ml  Output --  Net 1100 ml   Last 3 Weights 09/09/2020 01/06/2020 08/09/2019  Weight (lbs) 285 lb 0.9 oz 296 lb 285 lb  Weight (kg) 129.3 kg 134.265 kg 129.275 kg     Body mass index is 32.94 kg/m.  General:  Well nourished, well developed, in no acute distress.  He is lying fully supine on the examination stretcher without any sign of dyspnea.  He is speaking in uninterrupted sentences. HEENT: normal Lymph: no adenopathy Neck: no JVD Endocrine:  No thryomegaly Vascular: No carotid bruits; FA pulses 2+ bilaterally without bruits  Cardiac:  normal S1, S2; irregular; no murmur.  Healthy left subclavian defibrillator site.  His extremities are very cool to touch. Lungs:  clear to auscultation bilaterally, no wheezing, rhonchi or rales  Abd: soft, nontender, no hepatomegaly  Ext: no edema Musculoskeletal:  No deformities, BUE and BLE strength normal and equal Skin: warm and dry   Neuro:  CNs 2-12 intact, no focal abnormalities noted Psych:  Normal affect   EKG:  The EKG was personally reviewed and demonstrates: Atrial fibrillation with rapid ventricular response and frequent PVCs, right axis deviation, old inferior wall Q waves, delayed R wave progression across the anterior precordium, nonspecific IVCD QRS 124 ms, QTC only borderline prolonged at 470 ms Telemetry:  Telemetry was personally reviewed and demonstrates: Atrial fibrillation with rapid ventricular response  Relevant CV Studies: Echo 06/08/2018  - Left ventricle: Systolic function was normal. The estimated  ejection fraction was in the range of 60% to 65%. Wall motion was  normal; there were no regional wall motion abnormalities.  Features are consistent with a pseudonormal left ventricular  filling pattern, with concomitant abnormal relaxation and  increased filling pressure (grade 2 diastolic dysfunction).  - Left atrium: The atrium was severely dilated.   Cardiac cath 05/05/2016  1. Ost RCA to Dist RCA lesion, 100% stenosed. Known occluded. RPDA fills via collaterals with minimal filling of PL system 2. Post Atrio lesion, 80% stenosed. 3. Lat 1st Mrg lesion, 100% stenosed. Appears to be jailed from prior stent. Fills via collaterals 4. Widely patent stent in OM1, D1 and mid LAD 5. There is mild to moderate left ventricular systolic dysfunction. 6. Severely elevated LVEDP    No new potential culprit lesion visualized to explain the patient's presentation with ventricular tachycardiac. There is a but appears to be chronically occluded branch of an OM as well as known occlusion of the RCA.  He has known moderately reduced EF with now severely elevated LVEDP.  Plan:  Return to TCU for continued care.  Consider diuresis for elevated LVEDP. This is in the setting of normal to low systemic pressures.  Continue to titrate antiarrhythmic medications.   Laboratory Data:  High  Sensitivity Troponin:   Recent Labs  Lab 09/09/20 1336  TROPONINIHS 48*     Chemistry Recent Labs  Lab 09/09/20 1336  NA 130*  K 3.1*  CL 95*  CO2 16*  GLUCOSE 189*  BUN 55*  CREATININE 5.60*  CALCIUM 5.2*  GFRNONAA 10*  GFRAA 12*  ANIONGAP 19*    Recent Labs  Lab 09/09/20 1336  PROT 4.6*  ALBUMIN 2.0*  AST 88*  ALT 35  ALKPHOS 44  BILITOT 1.3*   Hematology Recent Labs  Lab 09/09/20 1336  WBC 10.6*  RBC 4.89  HGB 16.6  HCT 48.3  MCV 98.8  MCH 33.9  MCHC 34.4  RDW 15.6*  PLT PLATELET CLUMPS NOTED ON SMEAR, UNABLE TO ESTIMATE   BNPNo results for input(s): BNP, PROBNP in the last 168 hours.  DDimer No results for input(s): DDIMER in the last 168 hours.   Radiology/Studies:  DG Chest Port 1 View  Result Date: 09/09/2020 CLINICAL DATA:  Shortness of breath and tachycardia EXAM: PORTABLE CHEST 1 VIEW COMPARISON:  05/04/2016 FINDINGS: Cardiac shadow is enlarged but stable. Defibrillator is again noted. The lungs are clear bilaterally. No bony abnormality is seen. IMPRESSION: No acute abnormality noted. Electronically Signed   By: Inez Catalina M.D.   On: 09/09/2020 13:39    Assessment and Plan:   1. Shock: I favor the diagnosis of septic/hypovolemic shock, considering that his most recent LVEF was normal and that he has always had very well compensated heart failure.  There is no evidence for a new acute coronary event.  Of course we will have to reevaluate his echocardiogram.  Everything suggest that he is "dry" and he is receiving intravenous fluids.  All his heart failure meds should be on  hold.  He is receiving empirical antibiotics. 2. AFib: This is a new arrhythmia for him.  It does not appear to be causal since onset appears to have occurred after the onset of his symptoms and following the signs of volume depletion recorded by his defibrillator.  It will be difficult to rate control while he is hypotensive without the use of amiodarone and I recommend  restarting this.  Amiodarone was stopped about a year ago due to isolated elevation of alkaline phosphatase which has subsequently normalized.  We will watch his liver function tests.  Recommend intravenous heparin as anticoagulation.  His most recent echocardiogram documented a severely dilated left atrium with end-systolic diameter 56 mm. 3. CAD: He has not had any angina pectoris.  His most recent coronary angiogram was performed in 2017 and showed stable anatomy.  He has known chronic total occlusion of the right coronary artery and the most distal oblique marginal artery branch of the circumflex, and patent stents in the LAD, 1st diagonal and 1st oblique marginal.  Previous nuclear stress testing showed an inferolateral scar, presumably the source of his episodes of sustained ventricular tachycardia. 4. CHF: Since his initial evaluation in [place] his left ventricular ejection fraction has never been lower than 45% and his last two echoes from 2017 in 2019 actually documented normal left ventricular ejection fraction.  He has been very well compensated and has not required hospitalization for heart failure. 5. VT: Last episode of true VT seems to have been in January 2021 and was treated with antitachycardia pacing. 6. ICD: He underwent generator change in January 2018.  Device function is normal.  He was programmed with a rather low VT detection at 230 bpm due to his previous history of "slow VT".  Since he is now in atrial fibrillation rapid ventricular response, I temporarily increase the minimum rate of VT detection 250 bpm to avoid unnecessary therapy for atrial fibrillation with RVR.  When his acute almost resolves, device settings need to be again reviewed.  Recommend EP consultation. 7. OSA on CPAP    Care plan reviewed with the staff in the emergency department (Dr. Sabra Heck) and the pulmonary critical care consultants Gregory Landry Minor, NP)  CRITICAL CARE Performed by: Sanda Klein   Total  critical care time: 45 minutes  Critical care time was exclusive of separately billable procedures and treating other patients.  Critical care was necessary to treat or prevent imminent or life-threatening deterioration.  Critical care was time spent personally by me on the following activities: development of treatment plan with patient and/or surrogate as well as nursing, discussions with consultants, evaluation of patient's response to treatment, examination of patient, obtaining history from patient or surrogate, ordering and performing treatments and interventions, ordering and review of laboratory studies, ordering and review of radiographic studies, pulse oximetry and re-evaluation of patient's condition.   For questions or updates, please contact [place] Please consult [email or web address] for contact info under    Signed, Sanda Klein, MD  09/09/2020 3:57 PM

## 2020-09-09 NOTE — Progress Notes (Signed)
Pharmacy Antibiotic Note  Gregory [name] is a 59 y.o. male admitted on 09/09/2020 with sepsis.  Pharmacy has been consulted for Cefepime dosing.   Height: 6\' 6"  (198.1 cm) Weight: 129.3 kg (285 lb 0.9 oz) IBW/kg (Calculated) : 91.4  No data recorded.  Recent Labs  Lab 09/09/20 1336  WBC 10.6*  CREATININE 5.60*  LATICACIDVEN 3.2*    Estimated Creatinine Clearance: 20.9 mL/min (A) (by C-G formula based on SCr of 5.6 mg/dL (H)).    No Known Allergies  Antimicrobials this admission: 9/26 Cefepime >>   Dose adjustments this admission: N/a  Microbiology results: Pending   Plan:  - Cefepime 2g IV q24h - Monitor patients renal function and urine output  - De-escalate ABX when appropriate   Thank you for allowing pharmacy to be a part of this patient's care.  Duanne Limerick PharmD. BCPS 09/09/2020 3:31 PM

## 2020-09-09 NOTE — ED Notes (Signed)
Per MD Sabra Heck only one set of cultures needs to be drawn r/t to poor venous access during hypotensive state.

## 2020-09-09 NOTE — Progress Notes (Signed)
[facility] Progress Note Patient Name: Gregory [name] DOB: 08/23/1961 MRN: [id number]   Date of Service  09/09/2020  HPI/Events of Note  Request for aline as cuff BP inconsistent On norepinephrine 30  eICU Interventions  Aline ordered     Intervention Category Major Interventions: Hypotension - evaluation and management  Judd Lien 09/09/2020, 8:53 PM

## 2020-09-09 NOTE — Progress Notes (Signed)
2 RTs attempted Aline x2 on right radial and left radial unsuccessfully. RT notified RN.

## 2020-09-09 NOTE — ED Notes (Signed)
Attempted manual pressure, unable to hear anything

## 2020-09-10 ENCOUNTER — Inpatient Hospital Stay (HOSPITAL_COMMUNITY): Payer: Worker's Compensation

## 2020-09-10 DIAGNOSIS — I480 Paroxysmal atrial fibrillation: Secondary | ICD-10-CM

## 2020-09-10 DIAGNOSIS — I361 Nonrheumatic tricuspid (valve) insufficiency: Secondary | ICD-10-CM

## 2020-09-10 DIAGNOSIS — R571 Hypovolemic shock: Secondary | ICD-10-CM

## 2020-09-10 DIAGNOSIS — I5082 Biventricular heart failure: Secondary | ICD-10-CM

## 2020-09-10 DIAGNOSIS — I5032 Chronic diastolic (congestive) heart failure: Secondary | ICD-10-CM

## 2020-09-10 LAB — BASIC METABOLIC PANEL
Anion gap: 13 (ref 5–15)
Anion gap: 11 (ref 5–15)
Anion gap: 8 (ref 5–15)
BUN: 50 mg/dL — ABNORMAL HIGH (ref 8–23)
BUN: 35 mg/dL — ABNORMAL HIGH (ref 8–23)
BUN: 34 mg/dL — ABNORMAL HIGH (ref 8–23)
CO2: 18 mmol/L — ABNORMAL LOW (ref 22–32)
CO2: 17 mmol/L — ABNORMAL LOW (ref 22–32)
CO2: 23 mmol/L (ref 22–32)
Calcium: 5.1 mg/dL — CL (ref 8.9–10.3)
Calcium: <4 mg/dL — CL (ref 8.9–10.3)
Calcium: 5.8 mg/dL — CL (ref 8.9–10.3)
Chloride: 100 mmol/L (ref 98–111)
Chloride: 110 mmol/L (ref 98–111)
Chloride: 105 mmol/L (ref 98–111)
Creatinine, Ser: 3.05 mg/dL — ABNORMAL HIGH (ref 0.61–1.24)
Creatinine, Ser: 1.48 mg/dL — ABNORMAL HIGH (ref 0.61–1.24)
Creatinine, Ser: 1.64 mg/dL — ABNORMAL HIGH (ref 0.61–1.24)
GFR calc Af Amer: 24 mL/min — ABNORMAL LOW (ref >60)
GFR calc Af Amer: 58 mL/min — ABNORMAL LOW (ref >60)
GFR calc Af Amer: 52 mL/min — ABNORMAL LOW (ref >60)
GFR calc non Af Amer: 21 mL/min — ABNORMAL LOW (ref >60)
GFR calc non Af Amer: 50 mL/min — ABNORMAL LOW (ref >60)
GFR calc non Af Amer: 44 mL/min — ABNORMAL LOW (ref >60)
Glucose, Bld: 225 mg/dL — ABNORMAL HIGH (ref 70–99)
Glucose, Bld: 206 mg/dL — ABNORMAL HIGH (ref 70–99)
Glucose, Bld: 137 mg/dL — ABNORMAL HIGH (ref 70–99)
Potassium: 2.7 mmol/L — CL (ref 3.5–5.1)
Potassium: 2.5 mmol/L — CL (ref 3.5–5.1)
Potassium: 3.4 mmol/L — ABNORMAL LOW (ref 3.5–5.1)
Sodium: 131 mmol/L — ABNORMAL LOW (ref 135–145)
Sodium: 138 mmol/L (ref 135–145)
Sodium: 136 mmol/L (ref 135–145)

## 2020-09-10 LAB — CBC
HCT: 42 % (ref 39.0–52.0)
Hemoglobin: 14.7 g/dL (ref 13.0–17.0)
MCH: 34.1 pg — ABNORMAL HIGH (ref 26.0–34.0)
MCHC: 35 g/dL (ref 30.0–36.0)
MCV: 97.4 fL (ref 80.0–100.0)
Platelets: 78 10*3/uL — ABNORMAL LOW (ref 150–400)
RBC: 4.31 MIL/uL (ref 4.22–5.81)
RDW: 15.4 % (ref 11.5–15.5)
WBC: 10.6 10*3/uL — ABNORMAL HIGH (ref 4.0–10.5)
nRBC: 0.5 % — ABNORMAL HIGH (ref 0.0–0.2)

## 2020-09-10 LAB — HEPATIC FUNCTION PANEL
ALT: 23 U/L (ref 0–44)
AST: 37 U/L (ref 15–41)
Albumin: 1.3 g/dL — ABNORMAL LOW (ref 3.5–5.0)
Alkaline Phosphatase: 35 U/L — ABNORMAL LOW (ref 38–126)
Bilirubin, Direct: 0.2 mg/dL (ref 0.0–0.2)
Indirect Bilirubin: 0.3 mg/dL (ref 0.3–0.9)
Total Bilirubin: 0.5 mg/dL (ref 0.3–1.2)
Total Protein: 3.7 g/dL — ABNORMAL LOW (ref 6.5–8.1)

## 2020-09-10 LAB — URINE CULTURE: Culture: NO GROWTH

## 2020-09-10 LAB — HEPARIN LEVEL (UNFRACTIONATED)
Heparin Unfractionated: 0.45 IU/mL (ref 0.30–0.70)
Heparin Unfractionated: 0.19 IU/mL — ABNORMAL LOW (ref 0.30–0.70)
Heparin Unfractionated: <0.1 IU/mL — ABNORMAL LOW (ref 0.30–0.70)

## 2020-09-10 LAB — GLUCOSE, CAPILLARY
Glucose-Capillary: 196 mg/dL — ABNORMAL HIGH (ref 70–99)
Glucose-Capillary: 207 mg/dL — ABNORMAL HIGH (ref 70–99)
Glucose-Capillary: 226 mg/dL — ABNORMAL HIGH (ref 70–99)
Glucose-Capillary: 262 mg/dL — ABNORMAL HIGH (ref 70–99)
Glucose-Capillary: 143 mg/dL — ABNORMAL HIGH (ref 70–99)

## 2020-09-10 LAB — ECHOCARDIOGRAM COMPLETE
Height: 78 in
Weight: 4560.88 oz

## 2020-09-10 LAB — HEMOGLOBIN A1C
Hgb A1c MFr Bld: 6.3 % — ABNORMAL HIGH (ref 4.8–5.6)
Mean Plasma Glucose: 134.11 mg/dL

## 2020-09-10 LAB — MRSA PCR SCREENING: MRSA by PCR: NEGATIVE

## 2020-09-10 LAB — MAGNESIUM: Magnesium: 2.2 mg/dL (ref 1.7–2.4)

## 2020-09-10 LAB — TSH: TSH: 0.747 u[IU]/mL (ref 0.350–4.500)

## 2020-09-10 MED ORDER — CALCIUM GLUCONATE-NACL 1-0.675 GM/50ML-% IV SOLN
1.0000 g | Freq: Once | INTRAVENOUS | Status: AC
  Administered 2020-09-10: 1000 mg via INTRAVENOUS
  Filled 2020-09-10: qty 50

## 2020-09-10 MED ORDER — INSULIN ASPART 100 UNIT/ML ~~LOC~~ SOLN
0.0000 [IU] | Freq: Every day | SUBCUTANEOUS | Status: DC
  Administered 2020-09-17 – 2020-09-24 (×5): 2 [IU] via SUBCUTANEOUS

## 2020-09-10 MED ORDER — PERFLUTREN LIPID MICROSPHERE
INTRAVENOUS | Status: AC
  Administered 2020-09-10: 3 mL via INTRAVENOUS
  Filled 2020-09-10: qty 10

## 2020-09-10 MED ORDER — NOREPINEPHRINE 4 MG/250ML-% IV SOLN
INTRAVENOUS | Status: AC
  Administered 2020-09-10: 35 ug/min
  Filled 2020-09-10: qty 250

## 2020-09-10 MED ORDER — NOREPINEPHRINE 4 MG/250ML-% IV SOLN
INTRAVENOUS | Status: AC
  Filled 2020-09-10: qty 250

## 2020-09-10 MED ORDER — INSULIN ASPART 100 UNIT/ML ~~LOC~~ SOLN
0.0000 [IU] | Freq: Three times a day (TID) | SUBCUTANEOUS | Status: DC
  Administered 2020-09-10: 11 [IU] via SUBCUTANEOUS
  Administered 2020-09-11: 4 [IU] via SUBCUTANEOUS
  Administered 2020-09-11 – 2020-09-12 (×4): 3 [IU] via SUBCUTANEOUS
  Administered 2020-09-13: 4 [IU] via SUBCUTANEOUS
  Administered 2020-09-13 – 2020-09-14 (×4): 3 [IU] via SUBCUTANEOUS
  Administered 2020-09-15: 4 [IU] via SUBCUTANEOUS
  Administered 2020-09-15: 3 [IU] via SUBCUTANEOUS
  Administered 2020-09-15 – 2020-09-16 (×2): 4 [IU] via SUBCUTANEOUS
  Administered 2020-09-16: 3 [IU] via SUBCUTANEOUS
  Administered 2020-09-16 – 2020-09-17 (×2): 4 [IU] via SUBCUTANEOUS
  Administered 2020-09-17: 3 [IU] via SUBCUTANEOUS
  Administered 2020-09-18: 4 [IU] via SUBCUTANEOUS
  Administered 2020-09-18: 7 [IU] via SUBCUTANEOUS
  Administered 2020-09-18: 4 [IU] via SUBCUTANEOUS
  Administered 2020-09-19: 7 [IU] via SUBCUTANEOUS
  Administered 2020-09-19: 4 [IU] via SUBCUTANEOUS
  Administered 2020-09-20 (×2): 7 [IU] via SUBCUTANEOUS
  Administered 2020-09-20: 4 [IU] via SUBCUTANEOUS
  Administered 2020-09-21: 11 [IU] via SUBCUTANEOUS
  Administered 2020-09-21: 2 [IU] via SUBCUTANEOUS
  Administered 2020-09-21: 11 [IU] via SUBCUTANEOUS
  Administered 2020-09-22: 7 [IU] via SUBCUTANEOUS
  Administered 2020-09-22: 4 [IU] via SUBCUTANEOUS
  Administered 2020-09-22: 3 [IU] via SUBCUTANEOUS
  Administered 2020-09-23 – 2020-09-25 (×5): 4 [IU] via SUBCUTANEOUS
  Administered 2020-09-25 – 2020-09-26 (×3): 3 [IU] via SUBCUTANEOUS
  Administered 2020-09-26: 4 [IU] via SUBCUTANEOUS
  Administered 2020-09-27 – 2020-09-29 (×3): 3 [IU] via SUBCUTANEOUS

## 2020-09-10 MED ORDER — CHLORHEXIDINE GLUCONATE CLOTH 2 % EX PADS
6.0000 | MEDICATED_PAD | Freq: Every day | CUTANEOUS | Status: DC
  Administered 2020-09-10 – 2020-09-29 (×12): 6 via TOPICAL

## 2020-09-10 MED ORDER — SODIUM CHLORIDE 0.9% FLUSH: 10.0000 mL | INTRAVENOUS | Status: DC | PRN

## 2020-09-10 MED ORDER — LACTATED RINGERS IV SOLN: INTRAVENOUS | Status: DC

## 2020-09-10 MED ORDER — NOREPINEPHRINE 4 MG/250ML-% IV SOLN
0.0000 ug/min | INTRAVENOUS | Status: DC
  Administered 2020-09-10: 12 ug/min via INTRAVENOUS
  Administered 2020-09-10: 14 ug/min via INTRAVENOUS
  Filled 2020-09-10 (×2): qty 250

## 2020-09-10 MED ORDER — POTASSIUM CHLORIDE 10 MEQ/50ML IV SOLN
10.0000 meq | INTRAVENOUS | Status: AC
  Administered 2020-09-10 (×4): 10 meq via INTRAVENOUS
  Filled 2020-09-10 (×4): qty 50

## 2020-09-10 MED ORDER — PERFLUTREN LIPID MICROSPHERE
1.0000 mL | INTRAVENOUS | Status: AC | PRN
  Filled 2020-09-10: qty 10

## 2020-09-10 MED ORDER — SODIUM CHLORIDE 0.9% FLUSH
10.0000 mL | Freq: Two times a day (BID) | INTRAVENOUS | Status: DC
  Administered 2020-09-10 – 2020-09-11 (×3): 10 mL

## 2020-09-10 MED ORDER — CALCIUM GLUCONATE-NACL 1-0.675 GM/50ML-% IV SOLN
1.0000 g | Freq: Once | INTRAVENOUS | Status: AC
  Administered 2020-09-10: 1000 mg via INTRAVENOUS
  Filled 2020-09-10 (×2): qty 50

## 2020-09-10 MED ORDER — POTASSIUM CHLORIDE CRYS ER 20 MEQ PO TBCR
40.0000 meq | EXTENDED_RELEASE_TABLET | Freq: Once | ORAL | Status: AC
  Administered 2020-09-10: 40 meq via ORAL
  Filled 2020-09-10: qty 2

## 2020-09-10 MED ORDER — HEPARIN BOLUS VIA INFUSION
2000.0000 [IU] | Freq: Once | INTRAVENOUS | Status: AC
  Administered 2020-09-10: 2000 [IU] via INTRAVENOUS
  Filled 2020-09-10: qty 2000

## 2020-09-10 MED ORDER — POTASSIUM CHLORIDE CRYS ER 20 MEQ PO TBCR
40.0000 meq | EXTENDED_RELEASE_TABLET | ORAL | Status: AC
  Administered 2020-09-10 (×3): 40 meq via ORAL
  Filled 2020-09-10 (×3): qty 2

## 2020-09-10 MED ORDER — MELATONIN 5 MG PO TABS
5.0000 mg | ORAL_TABLET | Freq: Every day | ORAL | Status: DC
  Administered 2020-09-10: 5 mg via ORAL
  Filled 2020-09-10: qty 1

## 2020-09-10 NOTE — Progress Notes (Signed)
[facility] Progress Note Patient Name: Gregory [name] DOB: 1960/12/28 MRN: [id number]   Date of Service  09/10/2020  HPI/Events of Note  K 2.5, Calcium 5.1 Creatinine3.05  eICU Interventions  Ordered K 10 meqs x 4 and calcium 1 g     Intervention Category Major Interventions: Electrolyte abnormality - evaluation and management  Judd Lien 09/10/2020, 6:21 AM

## 2020-09-10 NOTE — Progress Notes (Signed)
NAME:  Gregory [name], MRN:  [id number], DOB:  1961/09/18, LOS: 1 ADMISSION DATE:  09/09/2020, CONSULTATION DATE: 09/09/2020 REFERRING MD: Emergency part physician, CHIEF COMPLAINT: Near syncopal episode questionable V. tach  Brief History   59 year old extensive cardiac history 3 days nausea vomiting diarrhea  History of present illness   Gregory [name] is a 59 year old male with extensive past medical history includes his coronary disease, diastolic systolic heart failure he has an implanted automatic cardiac defibrillator for V. Tach. He reports to be in his usual state of health until approximately 3 days ago which time he developed nausea vomiting and diarrhea he presents to [facility] after being treated with amiodarone for suspected V. tach there is I AICD did not shock him.  He is also on multiple diuretics including Hydrocort thiazide along with St[place].  He was noted to have creatinine elevation most likely from dehydration and taking of diuretics.  He is awake alert not on oxygen with sats of 100% but requiring low-dose Levophed for blood pressure support.  We will continue to IV fluid resuscitate him as he does have elevated lactic acid check a procalcitonin and make decision whether to continue antimicrobial therapy.  Past Medical History   Past Medical History:  Diagnosis Date  . CAD (coronary artery disease) 12/01/2013  . Chronic combined systolic and diastolic CHF, NYHA class 1 ([place]) 12/01/2013  . Erectile dysfunction 12/01/2013  . HTN (hypertension) 12/01/2013  . Hyperlipidemia 12/01/2013  . Ischemic cardiomyopathy 12/01/2013  . Sleep apnea      Significant Hospital Events   09/09/2020 admit to the intensive care unit  Consults:  09/09/2020 cardiology  Procedures:  Right femoral central line per EDP  Significant Diagnostic Tests:  09/09/2020 implant interrogation by cardiology  Micro Data:  09/09/2020 blood cultures x2 09/09/2020 urine culture 09/09/2020  procalcitonin  Antimicrobials:  09/09/2020 Flagyl 09/09/2020 cefepime  Interim history/subjective:  Remains on fairly high dose pressors although cuff pressure accuracy in doubt.  Objective   Blood pressure (!) 77/67, pulse 82, temperature 98.5 F (36.9 C), temperature source Oral, resp. rate 16, height 6\' 6"  (1.981 m), weight 129.3 kg, SpO2 91 %. CVP:  [13 mmHg] 13 mmHg      Intake/Output Summary (Last 24 hours) at 09/10/2020 0648 Last data filed at 09/10/2020 0600 Gross per 24 hour  Intake 7906.43 ml  Output 1500 ml  Net 6406.43 ml   Filed Weights   09/09/20 1520  Weight: 129.3 kg    Examination: Constitutional: chronically ill man in no acute distress Eyes: eyes are anicteric, reactive to light Ears, nose, mouth, and throat: mucous membranes moist, trachea midline Cardiovascular: heart sounds are regular, ext are warm to touch. no edema Respiratory: Diminished bases, no accessory muscle use Gastrointestinal: abdomen is soft with + BS Skin: No rashes, normal turgor Neurologic: moves all 4 ext to command Psychiatric: RASS 0  Cr 4.5>>3 BUN 54>>50 Lactate 2.9 Pct 0.7 Stable mild plt decrease question clumping Sugars a bit up  Resolved Hospital Problem list     Assessment & Plan:  Shock, suspected hypovolemic in setting of diarrhea- 6L crystalloid given - Place arterial line, wean pressors, hydrate - f/u echo  AKI in setting of entresto, dehydration- 1500 cc urine output - Avoid nephrotoxins, strict I/O  Underlying diastolic heart failure- seems volume down still  New Afib- likely reactive to shock state, now on amio and heparin gtt, appreciate cardiology help  Mild acute liver injury- f/u am LFTs while on amio  Diarrheal  illness- resolved  Best practice:   Diet: cardiac Pain/Anxiety/Delirium protocol (if indicated): As needed VAP protocol (if indicated): Not indicated DVT prophylaxis: Heparin gtt GI prophylaxis: PPI Glucose control: Sliding scale  insulin protocol Mobility: Bedrest Code Status: Full Family Communication: patient updated Disposition: ICU pending pressor liberation   The patient is critically ill with multiple organ systems failure and requires high complexity decision making for assessment and support, frequent evaluation and titration of therapies, application of advanced monitoring technologies and extensive interpretation of multiple databases. Critical Care Time devoted to patient care services described in this note independent of APP/resident time (if applicable)  is 35 minutes.   Erskine Emery MD [facility] 09/10/2020 6:53 AM Personal pager: [phone] If unanswered, please page CCM On-call: [phone]

## 2020-09-10 NOTE — Progress Notes (Signed)
RN noticed positional a-line. Went into patients room and found minimal blood around the site. RT called to redress a-line. 10 minutes pass cathter came out of site. Pressure held to reach hemostasis. Dr.Smith made aware no new orders to place a-line.

## 2020-09-10 NOTE — Progress Notes (Addendum)
Progress Note  Patient Name: Gregory [name] Date of Encounter: 09/10/2020  [facility] Cardiologist: Sanda Klein, MD   Subjective   Reports feeling improved.  Denies any chest pain or dyspnea.  Reports diarrhea resolved.  On Levophed drip at 10 mcg/min.  Net +6.4 L yesterday.  Renal function improving (creatinine 5.6 > 4.5 > 3.1).  Inpatient Medications    Scheduled Meds: . Chlorhexidine Gluconate Cloth  6 each Topical Daily  . pantoprazole (PROTONIX) IV  40 mg Intravenous QHS  . potassium chloride  40 mEq Oral Once  . promethazine  25 mg Intravenous Once  . sodium chloride flush  10-40 mL Intracatheter Q12H   Continuous Infusions: . sodium chloride Stopped (09/10/20 0103)  . sodium chloride 50 mL/hr at 09/10/20 0600  . amiodarone 30 mg/hr (09/10/20 0600)  . calcium gluconate 1,000 mg (09/10/20 0648)  . heparin 1,750 Units/hr (09/10/20 0600)  . lactated ringers    . lactated ringers    . norepinephrine 35 mcg/min (09/10/20 0500)  . norepinephrine 35 mcg/min (09/10/20 0600)  . norepinephrine 35 mcg/min (09/10/20 0621)  . potassium chloride 10 mEq (09/10/20 0644)   PRN Meds: docusate sodium, polyethylene glycol, sodium chloride flush   Vital Signs    Vitals:   09/10/20 0600 09/10/20 0630 09/10/20 0645 09/10/20 0700  BP: (!) 77/67     Pulse: 62 82 80 70  Resp: (!) 24 16 (!) 25 20  Temp:      TempSrc:      SpO2: 93% 91% 97% 94%  Weight:      Height:        Intake/Output Summary (Last 24 hours) at 09/10/2020 0737 Last data filed at 09/10/2020 0600 Gross per 24 hour  Intake 7906.43 ml  Output 1500 ml  Net 6406.43 ml   Last 3 Weights 09/09/2020 01/06/2020 08/09/2019  Weight (lbs) 285 lb 0.9 oz 296 lb 285 lb  Weight (kg) 129.3 kg 134.265 kg 129.275 kg      Telemetry    AF with rate 80-100s.  PVCs.  NSVT up to 7 beats- Personally Reviewed  ECG    No new ECG - Personally Reviewed  Physical Exam   GEN: No acute distress.   Neck: No  JVD Cardiac: irregular, normal rate, distant heart sounds, no murmurs Respiratory: Clear to auscultation bilaterally. GI: Soft MS: No edema Neuro:  Nonfocal  Psych: Normal affect   Labs    High Sensitivity Troponin:   Recent Labs  Lab 09/09/20 1336  TROPONINIHS 48*      Chemistry Recent Labs  Lab 09/09/20 1336 09/09/20 1336 09/09/20 1758 09/09/20 1810 09/10/20 0505  NA 130*   < > 132* 132* 131*  K 3.1*   < > 3.0* 3.4* 2.7*  CL 95*  --   --  98 100  CO2 16*  --   --  18* 18*  GLUCOSE 189*  --   --  187* 225*  BUN 55*  --   --  54* 50*  CREATININE 5.60*  --   --  4.48* 3.05*  CALCIUM 5.2*  --   --  5.4* 5.1*  PROT 4.6*  --   --  4.6*  --   ALBUMIN 2.0*  --   --  2.0*  --   AST 88*  --   --  83*  --   ALT 35  --   --  34  --   ALKPHOS 44  --   --  41  --   BILITOT 1.3*  --   --  1.2  --   GFRNONAA 10*  --   --  13* 21*  GFRAA 12*  --   --  15* 24*  ANIONGAP 19*  --   --  16* 13   < > = values in this interval not displayed.     Hematology Recent Labs  Lab 09/09/20 1336 09/09/20 1336 09/09/20 1758 09/09/20 1810 09/10/20 0505  WBC 10.6*  --   --  10.9* 10.6*  RBC 4.89  --   --  4.61 4.31  HGB 16.6   < > 15.6 15.3 14.7  HCT 48.3   < > 46.0 44.4 42.0  MCV 98.8  --   --  96.3 97.4  MCH 33.9  --   --  33.2 34.1*  MCHC 34.4  --   --  34.5 35.0  RDW 15.6*  --   --  15.2 15.4  PLT PLATELET CLUMPS NOTED ON SMEAR, UNABLE TO ESTIMATE  --   --  76* 78*   < > = values in this interval not displayed.    BNPNo results for input(s): BNP, PROBNP in the last 168 hours.   DDimer No results for input(s): DDIMER in the last 168 hours.   Radiology    DG Chest Port 1 View  Result Date: 09/09/2020 CLINICAL DATA:  Shortness of breath and tachycardia EXAM: PORTABLE CHEST 1 VIEW COMPARISON:  05/04/2016 FINDINGS: Cardiac shadow is enlarged but stable. Defibrillator is again noted. The lungs are clear bilaterally. No bony abnormality is seen. IMPRESSION: No acute abnormality  noted. Electronically Signed   By: Inez Catalina M.D.   On: 09/09/2020 13:39    Cardiac Studies   Echo 06/08/18: - Left ventricle: Systolic function was normal. The estimated  ejection fraction was in the range of 60% to 65%. Wall motion was  normal; there were no regional wall motion abnormalities.  Features are consistent with a pseudonormal left ventricular  filling pattern, with concomitant abnormal relaxation and  increased filling pressure (grade 2 diastolic dysfunction).  - Left atrium: The atrium was severely dilated.   Cath 05/05/16: 1. Ost RCA to Dist RCA lesion, 100% stenosed. Known occluded. RPDA fills via collaterals with minimal filling of PL system 2. Post Atrio lesion, 80% stenosed. 3. Lat 1st Mrg lesion, 100% stenosed. Appears to be jailed from prior stent. Fills via collaterals 4. Widely patent stent in OM1, D1 and mid LAD 5. There is mild to moderate left ventricular systolic dysfunction. 6. Severely elevated LVEDP   Patient Profile     59 y.o. male with a hx of CAD, CHF (EF evaluation as low as 45%, but recently 60-65%) and VT s/p ICD who presents with shock   Assessment & Plan    Shock: suspect septic/hypovolemic shock.  Most recent LVEF was normal.  Renal function improving with IV fluids.   -Check echocardiogram -Hold heart failure medications   AFib:  New diagnosis.  Previously was on amiodarone for his VT but was stopped due to isolated elevation of alkaline phosphatase, which has resolved.  Restarted on amiodarone. -Continue IV amiodarone, appears rate controlled.  Monitor LFTs -Continue IV heparin for anticoagulation  CAD: He has not had any chest pain.  His most recent coronary angiogram was performed in 2017 and showed stable anatomy.  He has known chronic total occlusion of the right coronary artery and the most distal oblique marginal artery branch of the circumflex, and  patent stents in the LAD, 1st diagonal and 1st oblique marginal.   Previous nuclear stress testing showed an inferolateral scar, presumably the source of his episodes of sustained ventricular tachycardia.  CHF: Since his initial evaluation in [place] his left ventricular ejection fraction has never been lower than 45% and his last two echoes from 2017 in 2019 actually documented normal left ventricular ejection fraction.  He has been very well compensated and has not required hospitalization for heart failure.  Will repeat echocardiogram as above.  VT: Last episode of true VT seems to have been in January 2021 and was treated with antitachycardia pacing.  Having runs of NSVT, would continue amiodarone as above and maintain K>4, Mag>2  ICD: He underwent generator change in January 2018.  Device function is normal.  He was programmed with a rather low VT detection at 230 bpm due to his previous history of "slow VT".  Since he was in atrial fibrillation with rapid ventricular response on admission, Dr Sallyanne Kuster increased the minimum rate of VT detection 250 bpm to avoid unnecessary therapy for atrial fibrillation with RVR.  When his acute illness resolves, device settings need to be again reviewed, will discuss with EP at that time  CRITICAL CARE TIME: I have spent a total of 35 minutes with patient reviewing hospital notes, telemetry, EKGs, labs and examining the patient as well as establishing an assessment and plan that was discussed with the patient.  > 50% of time was spent in direct patient care. The patient is critically ill with multi-organ system failure and requires high complexity decision making for assessment and support, frequent evaluation and titration of therapies, application of advanced monitoring technologies and extensive interpretation of multiple databases.  For questions or updates, please contact Laurie Please consult [email or web address] for contact info under       Signed, Donato Heinz, MD  09/10/2020, 7:37 AM

## 2020-09-10 NOTE — Progress Notes (Signed)
ANTICOAGULATION CONSULT NOTE  Pharmacy Consult for heparin Indication: atrial fibrillation  No Known Allergies  Patient Measurements: Height: 6\' 6"  (198.1 cm) Weight: 129.3 kg (285 lb 0.9 oz) IBW/kg (Calculated) : 91.4 Heparin Dosing Weight: 118kg  Vital Signs: Temp: 98.5 F (36.9 C) (09/27 1548) Temp Source: Oral (09/27 1548) BP: 97/84 (09/27 1700) Pulse Rate: 81 (09/27 1700)  Labs: Recent Labs    09/09/20 1336 09/09/20 1336 09/09/20 1758 09/09/20 1758 09/09/20 1810 09/09/20 1810 09/10/20 0031 09/10/20 0505 09/10/20 0725 09/10/20 1407 09/10/20 1645  HGB 16.6   < > 15.6   < > 15.3  --   --  14.7  --   --   --   HCT 48.3   < > 46.0  --  44.4  --   --  42.0  --   --   --   PLT PLATELET CLUMPS NOTED ON SMEAR, UNABLE TO ESTIMATE  --   --   --  76*  --   --  78*  --   --   --   APTT  --   --   --   --  33  --   --   --   --   --   --   LABPROT  --   --   --   --  16.5*  --   --   --   --   --   --   INR  --   --   --   --  1.4*  --   --   --   --   --   --   HEPARINUNFRC  --   --   --   --   --   --  0.45  --  0.19*  --  <0.10*  CREATININE 5.60*   < >  --   --  4.48*   < >  --  3.05*  --  QUESTIONABLE RESULTS, RECOMMEND RECOLLECT TO VERIFY 1.48*  TROPONINIHS 48*  --   --   --   --   --   --   --   --   --   --    < > = values in this interval not displayed.    Estimated Creatinine Clearance: 79 mL/min (A) (by C-G formula based on SCr of 1.48 mg/dL (H)).   Medical History: Past Medical History:  Diagnosis Date  . CAD (coronary artery disease) 12/01/2013  . Chronic combined systolic and diastolic CHF, NYHA class 1 ([place]) 12/01/2013  . Erectile dysfunction 12/01/2013  . HTN (hypertension) 12/01/2013  . Hyperlipidemia 12/01/2013  . Ischemic cardiomyopathy 12/01/2013  . Sleep apnea      Assessment: 85 yoM admitted with AFib RVR started on IV heparin. Initial heparin level therapeutic, now undetectable. CBC stable, pltc low since admit.  Goal of Therapy:  Heparin  level 0.3-0.7 units/ml Monitor platelets by anticoagulation protocol: Yes   Plan:  Heparin bolus of 2000 units Increase heparin to 2300 units/h Recheck heparin level in [place] PharmD., BCPS Clinical Pharmacist 09/10/2020 6:13 PM

## 2020-09-10 NOTE — Procedures (Signed)
Arterial Catheter Insertion Procedure Note  Quadre [place]  [id number]  [birth year]  Date:09/10/20  Time:8:33 AM    Provider Performing: Candee Furbish    Procedure: Insertion of Arterial Line [phone]) with US guidance (08138)   Indication(s) Blood pressure monitoring and/or need for frequent ABGs  Consent Risks of the procedure as well as the alternatives and risks of each were explained to the patient and/or caregiver.  Consent for the procedure was obtained and is signed in the bedside chart  Anesthesia None   Time Out Verified patient identification, verified procedure, site/side was marked, verified correct patient position, special equipment/implants available, medications/allergies/relevant history reviewed, required imaging and test results available.   Sterile Technique Maximal sterile technique including full sterile barrier drape, hand hygiene, sterile gown, sterile gloves, mask, hair covering, sterile ultrasound probe cover (if used).   Procedure Description Area of catheter insertion was cleaned with chlorhexidine and draped in sterile fashion. With real-time ultrasound guidance an arterial catheter was placed into the right radial artery.  Appropriate arterial tracings confirmed on monitor.     Complications/Tolerance None; patient tolerated the procedure well.   EBL Minimal   Specimen(s) None

## 2020-09-10 NOTE — Progress Notes (Addendum)
Per patient request, patient wanted to notify Md about using cpap every night at home. He forgot to mention it last night. Patient also requested medication for sleep aid and reports feeling anxious/figety now and through-out the day. E-link Rn Kalida notified and will pass along to RN/MD on call.

## 2020-09-10 NOTE — Progress Notes (Signed)
ANTICOAGULATION CONSULT NOTE  Pharmacy Consult for heparin Indication: atrial fibrillation  No Known Allergies  Patient Measurements: Height: 6\' 6"  (198.1 cm) Weight: 129.3 kg (285 lb 0.9 oz) IBW/kg (Calculated) : 91.4 Heparin Dosing Weight: 118kg  Vital Signs: Temp: 98.5 F (36.9 C) (09/27 0400) Temp Source: Oral (09/27 0400) BP: 77/67 (09/27 0600) Pulse Rate: 70 (09/27 0700)  Labs: Recent Labs    09/09/20 1336 09/09/20 1336 09/09/20 1758 09/09/20 1758 09/09/20 1810 09/10/20 0031 09/10/20 0505 09/10/20 0725  HGB 16.6   < > 15.6   < > 15.3  --  14.7  --   HCT 48.3   < > 46.0  --  44.4  --  42.0  --   PLT PLATELET CLUMPS NOTED ON SMEAR, UNABLE TO ESTIMATE  --   --   --  76*  --  78*  --   APTT  --   --   --   --  33  --   --   --   LABPROT  --   --   --   --  16.5*  --   --   --   INR  --   --   --   --  1.4*  --   --   --   HEPARINUNFRC  --   --   --   --   --  0.45  --  0.19*  CREATININE 5.60*  --   --   --  4.48*  --  3.05*  --   TROPONINIHS 48*  --   --   --   --   --   --   --    < > = values in this interval not displayed.    Estimated Creatinine Clearance: 38.3 mL/min (A) (by C-G formula based on SCr of 3.05 mg/dL (H)).   Medical History: Past Medical History:  Diagnosis Date  . CAD (coronary artery disease) 12/01/2013  . Chronic combined systolic and diastolic CHF, NYHA class 1 ([place]) 12/01/2013  . Erectile dysfunction 12/01/2013  . HTN (hypertension) 12/01/2013  . Hyperlipidemia 12/01/2013  . Ischemic cardiomyopathy 12/01/2013  . Sleep apnea      Assessment: 19 yoM admitted with AFib RVR started on IV heparin. Initial heparin level therapeutic, no subtherapeutic likely due to bolus wearing off. CBC stable, pltc low since admit.  Goal of Therapy:  Heparin level 0.3-0.7 units/ml Monitor platelets by anticoagulation protocol: Yes   Plan:  Increase heparin to 2000 units/h Recheck heparin level in 6h   Arrie Senate, PharmD, BCPS Clinical  Pharmacist [phone] Please check AMION for all [place] numbers 09/10/2020

## 2020-09-10 NOTE — Progress Notes (Signed)
  Echocardiogram 2D Echocardiogram has been performed.  Gregory [name] 09/10/2020, 2:34 PM

## 2020-09-10 NOTE — Consult Note (Addendum)
Cardiology Consultation:   Patient ID: Tyree Fluharty MRN: [id number]; DOB: January 12, 1961  Admit date: 09/09/2020 Date of Consult: 09/10/2020  Primary Care Provider: Patient, No Pcp Per [facility] Cardiologist: Sanda Banyan Goodchild, MD  [facility] Electrophysiologist:  Dr. Caryl Comes    Patient Profile:   Johnney Killian Fernando is a 59 y.o. male with a hx of CAD (knonw chronic occl of RCA with prior PCIs to LAD, diag, and LCx), VT, ICM,  OSA w/CPAP, seizures, chronic CHF, HLD, HTN, obesity, who is being seen today for the evaluation of new AFib, h/o VT,  at the request of Dr. Sallyanne Kuster.  History of Present Illness:   Mr. Huss last saw Dr. Caryl Comes Jan 2021, noted history in 2017 to have slow VT prompting reduction of his VT zone rate to 150bpm.  He had stopped drinking (heavily at least).  He noted previously had been on amiodarone and mexiletine , the mexiletine stopped 2/2 GI symptoms, and the amiodarone stopped 2/2 abn ALk phos.  Noted frequent PVCs.  He was admitted to [facility] yesterday with progressive weakness, orthostatic dizziness, and a syncopal event.  He denies any excessive ETOH, mentions a couple days of loos stools (soft but not watery) 2 per day, but had been eating and taking oral fluids. He mentions feeling hoat and chilled as well No CP or palpitations No vomiting  He was markedly hypotensive, started on IVF with agreesive replacement as well a levophed. Dr. Sallyanne Kuster comnsulted (knows the pt well), reviewed his device interrogation (not in his chart or bedside currently for review), He noted"shows that he has been in uninterrupted paroxysmal atrial fibrillation with rapid ventricular response since roughly 8 AM yesterday 09/08/2020, roughly the last 31 hours.  Prior to the onset of atrial fibrillation he had incessant episodes of regular atrial tachycardia with 1: 1 AV conduction, catalog by his device as representing ventricular tachycardia.  All the episodes for which  electrograms are available for review are consistent with supraventricular origin.  Thankfully none of the episodes are long enough to trigger VT detection/therapies."  AFib is new for the patient, onset of the AFib timing was noted AFTER the development of his symptoms and not felt to contribute, he did raise the VT zone rate slight to try and avoid inappropriate shocks 2/2 the AF/SVT.  Started on amiodarone for rate control given hypotension, and heparin gtt  In d/w CCM, his shock is felt to be hypovolemic in setting of diarrhea. The pt reports subjective fever, T max here was 100.4 yesterday evening Normal WBC, Lactic acid 2.9, procalcitonin was only 0.70 LABS otherwise K+ 3.1 >> 3.4 >> 2.7 (replacement ordered, in progress) BUN 95 > 98 > 50 Creat 5.6 > 4.48 > 3.05 (basleine Creat 0.8-1.0) Mag 1.6 > 2.2 AST 88 > 83 ALT 35 > 34 Alk phos 44 WBC 10.6 H/H 14/42 Plts 76 > 78  BC drawn Resp panel and COVID neg    Past Medical History:  Diagnosis Date  . CAD (coronary artery disease) 12/01/2013  . Chronic combined systolic and diastolic CHF, NYHA class 1 ([place]) 12/01/2013  . Erectile dysfunction 12/01/2013  . HTN (hypertension) 12/01/2013  . Hyperlipidemia 12/01/2013  . Ischemic cardiomyopathy 12/01/2013  . Sleep apnea     Past Surgical History:  Procedure Laterality Date  . ACHILLES TENDON REPAIR     lft foot  . CARDIAC CATHETERIZATION N/A 05/05/2016   Procedure: Left Heart Cath and Coronary Angiography;  Surgeon: Leonie Man, MD;  Location: [place]  CV LAB;  Service: Cardiovascular;  Laterality: N/A;  . CARDIAC DEFIBRILLATOR PLACEMENT    . EP IMPLANTABLE DEVICE N/A 12/19/2016   Procedure: ICD Generator Changeout;  Surgeon: [name], MD;  Location: [place] CV [facility];  Service: Cardiovascular;  Laterality: N/A;  . WRIST TENODESIS       Home Medications:  Prior to Admission medications   Medication Sig Start Date End Date Taking? Authorizing Provider  aspirin  81 MG tablet Take 81 mg by mouth daily.   Yes [provider]  atorvastatin (LIPITOR) 80 MG tablet TAKE ONE TABLET BY MOUTH EVERY EVENING Patient taking differently: Take 80 mg by mouth daily.  10/25/19  Yes Croitoru, [name], MD  clopidogrel (PLAVIX) 75 MG tablet TAKE 1 TABLET(75 MG) BY MOUTH DAILY Patient taking differently: Take 75 mg by mouth daily.  03/07/20  Yes Duke, Tami Lin, PA  COREG CR 80 MG 24 hr capsule Take 1 capsule (80 mg total) by mouth daily. 11/14/19  Yes Croitoru, [name], MD  ENTRESTO 97-103 MG TAKE 1 TABLET BY MOUTH TWICE DAILY Patient taking differently: Take 1 tablet by mouth 2 (two) times daily.  11/18/19  Yes Croitoru, [name], MD  FEROSUL 325 (65 Fe) MG tablet TAKE 1 TABLET BY MOUTH DAILY WITH BREAKFAST Patient taking differently: Take 325 mg by mouth daily with breakfast.  10/27/18  Yes Ladell Pier, MD  hydrochlorothiazide (HYDRODIURIL) 25 MG tablet TAKE 1 TABLET(25 MG) BY MOUTH DAILY Patient taking differently: Take 25 mg by mouth daily.  08/21/20  Yes Troy Sine, MD  levETIRAcetam (KEPPRA) 500 MG tablet TAKE 1 TABLET(500 MG) BY MOUTH TWICE DAILY Patient taking differently: Take 500 mg by mouth 2 (two) times daily.  08/16/20  Yes [name], MD  metoprolol succinate (TOPROL XL) 25 MG 24 hr tablet Take 1 tablet (25 mg total) by mouth daily. 01/06/20  Yes [name], MD  pantoprazole (PROTONIX) 40 MG tablet TAKE 1 TABLET(40 MG) BY MOUTH DAILY Patient taking differently: Take 40 mg by mouth daily.  04/02/20  Yes Kilroy, Luke K, PA-C  polyethylene glycol (MIRALAX / GLYCOLAX) packet Take 17 g by mouth daily as needed for moderate constipation or severe constipation. 06/09/18  Yes Amin, Ankit Chirag, MD  potassium chloride (KLOR-CON) 10 MEQ tablet TAKE 1 TABLET BY MOUTH EVERY DAY Patient taking differently: Take 10 mEq by mouth daily.  06/18/20  Yes Croitoru, [name], MD  sildenafil (VIAGRA) 50 MG tablet Take 1 tablet (50 mg total) by mouth as needed for  erectile dysfunction. 03/29/20  Yes Croitoru, [name], MD    Inpatient Medications: Scheduled Meds: . Chlorhexidine Gluconate Cloth  6 each Topical Daily  . pantoprazole (PROTONIX) IV  40 mg Intravenous QHS  . promethazine  25 mg Intravenous Once  . sodium chloride flush  10-40 mL Intracatheter Q12H   Continuous Infusions: . sodium chloride Stopped (09/10/20 0103)  . sodium chloride 50 mL/hr at 09/10/20 0600  . amiodarone 30 mg/hr (09/10/20 0600)  . heparin 1,750 Units/hr (09/10/20 0600)  . lactated ringers    . lactated ringers 100 mL/hr at 09/10/20 0836  . norepinephrine (LEVOPHED) Adult infusion    . potassium chloride 10 mEq (09/10/20 1010)   PRN Meds: docusate sodium, polyethylene glycol, sodium chloride flush  Allergies:   No Known Allergies  Social History:   Social History   Socioeconomic History  . Marital status: Single    Spouse name: Not on file  . Number of children: Not on file  .  Years of education: Not on file  . Highest education level: Not on file  Occupational History  . Not on file  Tobacco Use  . Smoking status: Former Smoker    Packs/day: 0.00    Years: 30.00    Pack years: 0.00    Types: Cigarettes    Quit date: 05/31/2013    Years since quitting: 7.2  . Smokeless tobacco: Never Used  Vaping Use  . Vaping Use: Never used  Substance and Sexual Activity  . Alcohol use: No    Comment: ocassionally  . Drug use: Never  . Sexual activity: Not Currently  Other Topics Concern  . Not on file  Social History Narrative   Pt lies in split level home with his son, daughter and grand-son   Has 3 children   Some college education   Retired Scientist, forensic.    Right handed   Social Determinants of Health   Financial Resource Strain:   . Difficulty of Paying Living Expenses: Not on file  Food Insecurity:   . Worried About Charity fundraiser in the Last Year: Not on file  . Ran Out of Food in the Last Year: Not on file  Transportation  Needs:   . Lack of Transportation (Medical): Not on file  . Lack of Transportation (Non-Medical): Not on file  Physical Activity:   . Days of Exercise per Week: Not on file  . Minutes of Exercise per Session: Not on file  Stress:   . Feeling of Stress : Not on file  Social Connections:   . Frequency of Communication with Friends and Family: Not on file  . Frequency of Social Gatherings with Friends and Family: Not on file  . Attends Religious Services: Not on file  . Active Member of Clubs or Organizations: Not on file  . Attends Archivist Meetings: Not on file  . Marital Status: Not on file  Intimate Partner Violence:   . Fear of Current or Ex-Partner: Not on file  . Emotionally Abused: Not on file  . Physically Abused: Not on file  . Sexually Abused: Not on file    Family History:   Family History  Problem Relation Age of Onset  . Heart disease Mother   . Brain cancer Father   . Hypertension Daughter      ROS:  Please see the history of present illness.  All other ROS reviewed and negative.     Physical Exam/Data:   Vitals:   09/10/20 0600 09/10/20 0630 09/10/20 0645 09/10/20 0700  BP: (!) 77/67     Pulse: 62 82 80 70  Resp: (!) 24 16 (!) 25 20  Temp:      TempSrc:      SpO2: 93% 91% 97% 94%  Weight:      Height:        Intake/Output Summary (Last 24 hours) at 09/10/2020 1025 Last data filed at 09/10/2020 0840 Gross per 24 hour  Intake 7906.43 ml  Output 1775 ml  Net 6131.43 ml   Last 3 Weights 09/09/2020 01/06/2020 08/09/2019  Weight (lbs) 285 lb 0.9 oz 296 lb 285 lb  Weight (kg) 129.3 kg 134.265 kg 129.275 kg     Body mass index is 32.94 kg/m.  General:  Well nourished, well developed, in no acute distress HEENT: normal Lymph: no adenopathy Neck: no JVD Endocrine:  No thryomegaly Vascular: No carotid bruits Cardiac:  irreg-irreg; no murmurs, gallops or ru bs Lungs:  CTA b/l, no wheezing, rhonchi or rales  Abd: soft, nontender, obese Ext:  no edema Musculoskeletal:  No deformities Skin: warm and dry  Neuro:  no focal abnormalities noted Psych:  Normal affect   EKG:  The EKG was personally reviewed and demonstrates:   AFib 116bpm, PVCs, no clear ischemic changes AFib 112bpm, PVCs  Telemetry:  Telemetry was personally reviewed and demonstrates:   AFib 90's, PVCs  Relevant CV Studies:  06/08/2018: TTE Study Conclusions - Left ventricle: Systolic function was normal. The estimated  ejection fraction was in the range of 60% to 65%. Wall motion was  normal; there were no regional wall motion abnormalities.  Features are consistent with a pseudonormal left ventricular  filling pattern, with concomitant abnormal relaxation and  increased filling pressure (grade 2 diastolic dysfunction).  - Left atrium: The atrium was severely dilated.    Cardiac cath 05/05/2016  1. Ost RCA to Dist RCA lesion, 100% stenosed. Known occluded. RPDA fills via collaterals with minimal filling of PL system 2. Post Atrio lesion, 80% stenosed. 3. Lat 1st Mrg lesion, 100% stenosed. Appears to be jailed from prior stent. Fills via collaterals 4. Widely patent stent in OM1, D1 and mid LAD 5. There is mild to moderate left ventricular systolic dysfunction. 6. Severely elevated LVEDP  No new potential culprit lesion visualized to explain the patient's presentation with ventricular tachycardiac. There is a but appears to be chronically occluded branch of an OM as well as known occlusion of the RCA.  He has known moderately reduced EF with now severely elevated LVEDP.  Plan:  Return to TCU for continued care.  Consider diuresis for elevated LVEDP. This is in the setting of normal to low systemic pressures.  Continue to titrate antiarrhythmic medications.  Laboratory Data:  High Sensitivity Troponin:   Recent Labs  Lab 09/09/20 1336  TROPONINIHS 48*     Chemistry Recent Labs  Lab 09/09/20 1336 09/09/20 1336 09/09/20 1758  09/09/20 1810 09/10/20 0505  NA 130*   < > 132* 132* 131*  K 3.1*   < > 3.0* 3.4* 2.7*  CL 95*  --   --  98 100  CO2 16*  --   --  18* 18*  GLUCOSE 189*  --   --  187* 225*  BUN 55*  --   --  54* 50*  CREATININE 5.60*  --   --  4.48* 3.05*  CALCIUM 5.2*  --   --  5.4* 5.1*  GFRNONAA 10*  --   --  13* 21*  GFRAA 12*  --   --  15* 24*  ANIONGAP 19*  --   --  16* 13   < > = values in this interval not displayed.    Recent Labs  Lab 09/09/20 1336 09/09/20 1810  PROT 4.6* 4.6*  ALBUMIN 2.0* 2.0*  AST 88* 83*  ALT 35 34  ALKPHOS 44 41  BILITOT 1.3* 1.2   Hematology Recent Labs  Lab 09/09/20 1336 09/09/20 1336 09/09/20 1758 09/09/20 1810 09/10/20 0505  WBC 10.6*  --   --  10.9* 10.6*  RBC 4.89  --   --  4.61 4.31  HGB 16.6   < > 15.6 15.3 14.7  HCT 48.3   < > 46.0 44.4 42.0  MCV 98.8  --   --  96.3 97.4  MCH 33.9  --   --  33.2 34.1*  MCHC 34.4  --   --  34.5 35.0  RDW 15.6*  --   --  15.2 15.4  PLT PLATELET CLUMPS NOTED ON SMEAR, UNABLE TO ESTIMATE  --   --  76* 78*   < > = values in this interval not displayed.   BNPNo results for input(s): BNP, PROBNP in the last 168 hours.  DDimer No results for input(s): DDIMER in the last 168 hours.   Radiology/Studies:  DG Chest Port 1 View  Result Date: 09/09/2020 CLINICAL DATA:  Shortness of breath and tachycardia EXAM: PORTABLE CHEST 1 VIEW COMPARISON:  05/04/2016 FINDINGS: Cardiac shadow is enlarged but stable. Defibrillator is again noted. The lungs are clear bilaterally. No bony abnormality is seen. IMPRESSION: No acute abnormality noted. Electronically Signed   By: Inez Catalina M.D.   On: 09/09/2020 13:39   {   Assessment and Plan:   1. New Afib in setting of shock     A/c with heparin gtt and amio for rate (would not expect conversion)     Will likely need TEE/DCCV once post acute issue, will need to discuss if he will need long term a/c or not      CHA2DS2Vasc is 3     Agree with changing VT zone rates for  now  2. Shock     Max temp 100.4 yesterday     BC pending     Some diarrhea reported though no drop off in oral inteke per the pt     Still requiring pressor 3. AKI     Evaluation and management with CCM and attending cardiology team     New echo is pending     CAD felt stable   Dr. Caryl Comes has seen and examined the patient His final thoughts and recommendations to follow   For questions or updates, please contact [facility] Please consult [email or web address] for contact info under    Signed, Baldwin Jamaica, PA-C  09/10/2020 10:25 AM  Atrial fibrillation-persistent  Shock question hypovolemic versus septic  Acute renal failure  Coronary artery disease with prior PCI; resolved ischemic cardiomyopathy  Congestive heart failure-chronic-systolic/diastolic  Ventricular tachycardia  ICD-[employer]    The patient is admitted with shock.  There is an antecedent history of "diarrhea nausea and vomiting "although the patient tells me there were only 2 BMs a day.  Moreover, was able to take modest fluids.  With the elevated lactate and the low-grade fever, I am still concerned that this is an infection.  Blood cultures are negative but device related infection is something that needs to be considered.  Appreciate the support from critical care with pressors.  I am not sure to what degree the atrial fibrillation is contributing to his hypotension; with his ongoing need for norepinephrine not sanguine that even a cardioverted atrial fibrillation would be able to maintain sinus rhythm at this point.  He has been put back on IV amiodarone for rate control and this might facilitate; we will keep him n.p.o. and make a decision regarding cardioversion in the morning.  He was started on heparin at 1950 hrs. on 9/26.  As documented by Dr. Thermon Leyland atrial fibrillation started 08 100 on 9/25 so was anticoagulated within a 48-hour window.  TEE I do not think is necessary.  No interval ventricular  tachycardia

## 2020-09-10 NOTE — Progress Notes (Signed)
Made aware of critical lab values. Orders received. Lab notified of questionable sample, resent labs and noted critical values were correct.

## 2020-09-10 NOTE — Progress Notes (Signed)
[place] Progress Note Patient Name: Siegfried Vieth DOB: Apr 02, 1961 MRN: [id number]   Date of Service  09/10/2020  HPI/Events of Note  Patient requests order for home CPAP and sleep aid.  eICU Interventions  Plan: 1. CPAP 12 Q HS - final settings per RT.  2. Melatonin 5 mg PO Q HS PRN sleep.      Intervention Category Major Interventions: Other:  Lysle Dingwall 09/10/2020, 8:54 PM

## 2020-09-11 ENCOUNTER — Inpatient Hospital Stay (HOSPITAL_COMMUNITY): Payer: Worker's Compensation

## 2020-09-11 LAB — HEPATIC FUNCTION PANEL
ALT: 31 U/L (ref 0–44)
AST: 44 U/L — ABNORMAL HIGH (ref 15–41)
Albumin: 1.9 g/dL — ABNORMAL LOW (ref 3.5–5.0)
Alkaline Phosphatase: 60 U/L (ref 38–126)
Bilirubin, Direct: 0.2 mg/dL (ref 0.0–0.2)
Indirect Bilirubin: 0.7 mg/dL (ref 0.3–0.9)
Total Bilirubin: 0.9 mg/dL (ref 0.3–1.2)
Total Protein: 5.3 g/dL — ABNORMAL LOW (ref 6.5–8.1)

## 2020-09-11 LAB — URINE CULTURE: Culture: NO GROWTH

## 2020-09-11 LAB — BASIC METABOLIC PANEL
Anion gap: 11 (ref 5–15)
BUN: 32 mg/dL — ABNORMAL HIGH (ref 8–23)
CO2: 21 mmol/L — ABNORMAL LOW (ref 22–32)
Calcium: 6.1 mg/dL — CL (ref 8.9–10.3)
Chloride: 104 mmol/L (ref 98–111)
Creatinine, Ser: 1.53 mg/dL — ABNORMAL HIGH (ref 0.61–1.24)
GFR calc Af Amer: 56 mL/min — ABNORMAL LOW (ref >60)
GFR calc non Af Amer: 48 mL/min — ABNORMAL LOW (ref >60)
Glucose, Bld: 142 mg/dL — ABNORMAL HIGH (ref 70–99)
Potassium: 3.6 mmol/L (ref 3.5–5.1)
Sodium: 136 mmol/L (ref 135–145)

## 2020-09-11 LAB — GLUCOSE, CAPILLARY
Glucose-Capillary: 126 mg/dL — ABNORMAL HIGH (ref 70–99)
Glucose-Capillary: 126 mg/dL — ABNORMAL HIGH (ref 70–99)
Glucose-Capillary: 131 mg/dL — ABNORMAL HIGH (ref 70–99)
Glucose-Capillary: 136 mg/dL — ABNORMAL HIGH (ref 70–99)
Glucose-Capillary: 140 mg/dL — ABNORMAL HIGH (ref 70–99)
Glucose-Capillary: 164 mg/dL — ABNORMAL HIGH (ref 70–99)

## 2020-09-11 LAB — CBC
HCT: 42 % (ref 39.0–52.0)
Hemoglobin: 14.5 g/dL (ref 13.0–17.0)
MCH: 34 pg (ref 26.0–34.0)
MCHC: 34.5 g/dL (ref 30.0–36.0)
MCV: 98.4 fL (ref 80.0–100.0)
Platelets: 69 10*3/uL — ABNORMAL LOW (ref 150–400)
RBC: 4.27 MIL/uL (ref 4.22–5.81)
RDW: 15.8 % — ABNORMAL HIGH (ref 11.5–15.5)
WBC: 7.4 10*3/uL (ref 4.0–10.5)
nRBC: 0.7 % — ABNORMAL HIGH (ref 0.0–0.2)

## 2020-09-11 LAB — PHOSPHORUS: Phosphorus: 2.1 mg/dL — ABNORMAL LOW (ref 2.5–4.6)

## 2020-09-11 LAB — HEPARIN LEVEL (UNFRACTIONATED)
Heparin Unfractionated: 0.41 IU/mL (ref 0.30–0.70)
Heparin Unfractionated: 0.34 IU/mL (ref 0.30–0.70)

## 2020-09-11 LAB — MAGNESIUM: Magnesium: 2.1 mg/dL (ref 1.7–2.4)

## 2020-09-11 MED ORDER — LEVETIRACETAM 500 MG PO TABS
500.0000 mg | ORAL_TABLET | Freq: Two times a day (BID) | ORAL | Status: DC
  Administered 2020-09-11 – 2020-09-15 (×8): 500 mg via ORAL
  Filled 2020-09-11 (×2): qty 1
  Filled 2020-09-11: qty 2
  Filled 2020-09-11 (×4): qty 1
  Filled 2020-09-11: qty 2
  Filled 2020-09-11: qty 1

## 2020-09-11 MED ORDER — POTASSIUM CHLORIDE 10 MEQ/50ML IV SOLN
10.0000 meq | INTRAVENOUS | Status: AC
  Administered 2020-09-11 (×4): 10 meq via INTRAVENOUS
  Filled 2020-09-11 (×4): qty 50

## 2020-09-11 MED ORDER — CALCIUM GLUCONATE-NACL 1-0.675 GM/50ML-% IV SOLN
1.0000 g | Freq: Once | INTRAVENOUS | Status: AC
  Administered 2020-09-11: 1000 mg via INTRAVENOUS
  Filled 2020-09-11: qty 50

## 2020-09-11 MED ORDER — ALBUMIN HUMAN 25 % IV SOLN
25.0000 g | Freq: Four times a day (QID) | INTRAVENOUS | Status: DC
  Administered 2020-09-11 (×3): 25 g via INTRAVENOUS
  Filled 2020-09-11 (×3): qty 100

## 2020-09-11 MED ORDER — LACTATED RINGERS IV SOLN
INTRAVENOUS | Status: DC
  Administered 2020-09-12: 1000 mL via INTRAVENOUS

## 2020-09-11 MED ORDER — ZOLPIDEM TARTRATE 5 MG PO TABS
5.0000 mg | ORAL_TABLET | Freq: Every evening | ORAL | Status: DC | PRN
  Administered 2020-09-11 – 2020-09-13 (×3): 5 mg via ORAL
  Filled 2020-09-11 (×3): qty 1

## 2020-09-11 MED ORDER — ALPRAZOLAM 0.25 MG PO TABS
0.2500 mg | ORAL_TABLET | Freq: Once | ORAL | Status: AC
  Administered 2020-09-11: 0.25 mg via ORAL
  Filled 2020-09-11: qty 1

## 2020-09-11 NOTE — Progress Notes (Signed)
[name] Progress Note Patient Name: Gregory [name] DOB: [birth year] MRN: [id number]   Date of Service  09/11/2020  HPI/Events of Note  Ca++ = 6.1 which corrects to 7.78 (Low) given albumin = 1.9.   eICU Interventions  Will replace Ca++.      Intervention Category Major Interventions: Electrolyte abnormality - evaluation and management  Kaliah Haddaway Eugene 09/11/2020, 5:46 AM

## 2020-09-11 NOTE — Progress Notes (Signed)
NAME:  Evens Meno, MRN:  [id number], DOB:  Aug 06, 1961, LOS: 2 ADMISSION DATE:  09/09/2020, CONSULTATION DATE: 09/09/2020 REFERRING MD: EDP, CHIEF COMPLAINT: Near syncopal episode questionable V. tach  Brief History   59 y/o M admitted 9/26 with 3 days of nausea / vomiting and diarrhea.  He has an extensive cardiac history with CAD, combined CHF, ICM s/p implanted automatic cardiac defibrillator for VT. Found on admit to have AKI thought in setting of dehydration/volume depletion, hypotension on vasopressors, lactic acidosis.   Past Medical History  CAD  Chronic Combined Systolic & Diastolic CHF  Ischemic Cardiomyopathy  HTN HLD OSA Erectile Dysfunction   Significant Hospital Events   9/26 Admit to the intensive care unit  Consults:  Cardiology  Procedures:  R Femoral TLC 9/26 >>   Significant Diagnostic Tests:  9/26 Implant interrogation by cardiology 9/27 ECHO >> LVEF 55-60%, normal LV function, no RWMA, normal RV function, LA mildly dilated 9/28 ABD [place] >>   Micro Data:  COVID 9/26 >> negative  Influenza A/B 9/26 >> negative  UC 9/26 >> negative  BCx2 9/26 >>   Antimicrobials:  Flagyl 9/26 >> 9/26 Cefepime 9/26 >> 9/27  Interim history/subjective:  Afebrile  I/O 3L UOP, +937 in last 24 hours Glucose range 131-142 Pt reports he is mildly short of breath at rest, does not feel his breathing is at baseline Planned abd [place] this afternoon  Objective   Blood pressure 108/81, pulse 92, temperature 98 F (36.7 C), temperature source Oral, resp. rate (!) 26, height 6\' 6"  (1.981 m), weight 135.3 kg, SpO2 100 %.        Intake/Output Summary (Last 24 hours) at 09/11/2020 0816 Last data filed at 09/11/2020 0525 Gross per 24 hour  Intake 3876.45 ml  Output 3075 ml  Net 801.45 ml   Filed Weights   09/09/20 1520 09/11/20 0500  Weight: 129.3 kg 135.3 kg    Examination: General: adult male lying in bed in NAD HEENT: MM pink/moist, anicteric, left eye with  ? Large pterygium (lateral pink raised area on corneal surface, ~ size of pencil eraser) Neuro: AAOx4, speech clear, MAE CV: s1s2 irr irr, frequent PVC's, no m/r/g PULM: mild dyspnea at rest, clear breath sounds  GI: soft, protuberant, bsx4 hypoactive Extremities: warm/dry, no edema  Skin: no rashes or lesions  Resolved Hospital Problem list     Assessment & Plan:   Shock, suspected Hypovolemic  In setting of baseline diuretic use, N/V, diarrhea x3 days  -continue LR, reduce rate to 18ml/hr with stop time in place for 9/29 am  -follow I/O's -monitor off vasopressors, weaned off levophed 9/28 am -ICU monitoring, may be able to transfer out later pm if remains stable off pressors   AKI  Suspect prerenal in setting of volume depletion -Trend BMP / urinary output -Replace electrolytes as indicated -Avoid nephrotoxic agents, ensure adequate renal perfusion  Hypocalcemia  Corrected Ca+ 7.8 -calcium gluconate with arrhythmias  -follow trend   Chronic Combined CHF -euvolemic on exam  -stop time for IVF's in place  -per Cardiology   New AF- likely reactive to shock state, now on amio and heparin gtt, appreciate cardiology help ? If related to shock, volume depletion  -continue amiodarone, heparin gtt  -appreciate Cardiology   Mild Acute Liver Injury -follow up LFT's  Thrombocytopenia  -monitor CBC  N/V, Diarrhea  -none further  -follow volume status closely   Insomnia  -add low dose ambien PRN, pt reports no response from melatonin  Best practice:  Diet: cardiac Pain/Anxiety/Delirium protocol (if indicated): As needed VAP protocol (if indicated): Not indicated DVT prophylaxis: Heparin gtt GI prophylaxis: PPI Glucose control: Sliding scale insulin protocol Mobility: Bedrest Code Status: Full Family Communication: patient updated on plan of care 9/28 Disposition: ICU      Noe Gens, MSN, NP-C [facility] 09/11/2020, 8:16  AM   Please see [email or web address] for pager details.

## 2020-09-11 NOTE — Progress Notes (Signed)
Progress Note  Patient Name: Gregory [name] Date of Encounter: 09/11/2020  [facility] Cardiologist: Sanda Klein, MD   Subjective   Reports no chest pain, mild dyspnea. Net +900cc yesterday.  Renal function improving (creatinine 5.6 > 4.5 > 3.1>1.5).  Inpatient Medications    Scheduled Meds: . Chlorhexidine Gluconate Cloth  6 each Topical Daily  . insulin aspart  0-20 Units Subcutaneous TID WC  . insulin aspart  0-5 Units Subcutaneous QHS  . melatonin  5 mg Oral QHS  . pantoprazole (PROTONIX) IV  40 mg Intravenous QHS  . sodium chloride flush  10-40 mL Intracatheter Q12H   Continuous Infusions: . sodium chloride Stopped (09/10/20 0103)  . sodium chloride 10 mL/hr at 09/11/20 0400  . albumin human    . amiodarone 30 mg/hr (09/11/20 0400)  . heparin 2,300 Units/hr (09/11/20 0525)  . lactated ringers    . norepinephrine (LEVOPHED) Adult infusion 2 mcg/min (09/11/20 0400)   PRN Meds: docusate sodium, polyethylene glycol, sodium chloride flush   Vital Signs    Vitals:   09/11/20 0300 09/11/20 0400 09/11/20 0443 09/11/20 0500  BP: 112/79 105/75  108/81  Pulse: 81 83  92  Resp: 18 (!) 22  (!) 26  Temp:   98 F (36.7 C)   TempSrc:   Oral   SpO2: 93% 97%  100%  Weight:    135.3 kg  Height:        Intake/Output Summary (Last 24 hours) at 09/11/2020 0847 Last data filed at 09/11/2020 0525 Gross per 24 hour  Intake 3876.45 ml  Output 2800 ml  Net 1076.45 ml   Last 3 Weights 09/11/2020 09/09/2020 01/06/2020  Weight (lbs) 298 lb 4.5 oz 285 lb 0.9 oz 296 lb  Weight (kg) 135.3 kg 129.3 kg 134.265 kg      Telemetry    AF with rate 90s.  PVCs. - Personally Reviewed  ECG    No new ECG - Personally Reviewed  Physical Exam   GEN: No acute distress.   Neck: No JVD Cardiac: irregular, normal rate, distant heart sounds, no murmurs Respiratory: Clear to auscultation bilaterally. GI: Soft MS: No edema Neuro:  Nonfocal  Psych: Normal affect   Labs     High Sensitivity Troponin:   Recent Labs  Lab 09/09/20 1336  TROPONINIHS 48*      Chemistry Recent Labs  Lab 09/10/20 1407 09/10/20 1407 09/10/20 1645 09/10/20 2246 09/11/20 0420  NA QUESTIONABLE RESULTS, RECOMMEND RECOLLECT TO VERIFY   < > 138 136 136  K QUESTIONABLE RESULTS, RECOMMEND RECOLLECT TO VERIFY   < > 2.5* 3.4* 3.6  CL QUESTIONABLE RESULTS, RECOMMEND RECOLLECT TO VERIFY   < > 110 105 104  CO2 QUESTIONABLE RESULTS, RECOMMEND RECOLLECT TO VERIFY   < > 17* 23 21*  GLUCOSE QUESTIONABLE RESULTS, RECOMMEND RECOLLECT TO VERIFY   < > 206* 137* 142*  BUN QUESTIONABLE RESULTS, RECOMMEND RECOLLECT TO VERIFY   < > 35* 34* 32*  CREATININE QUESTIONABLE RESULTS, RECOMMEND RECOLLECT TO VERIFY   < > 1.48* 1.64* 1.53*  CALCIUM QUESTIONABLE RESULTS, RECOMMEND RECOLLECT TO VERIFY   < > <4.0* 5.8* 6.1*  PROT QUESTIONABLE RESULTS, RECOMMEND RECOLLECT TO VERIFY  --  3.7*  --  5.3*  ALBUMIN QUESTIONABLE RESULTS, RECOMMEND RECOLLECT TO VERIFY  --  1.3*  --  1.9*  AST QUESTIONABLE RESULTS, RECOMMEND RECOLLECT TO VERIFY  --  37  --  44*  ALT QUESTIONABLE RESULTS, RECOMMEND RECOLLECT TO VERIFY  --  23  --  31  ALKPHOS QUESTIONABLE RESULTS, RECOMMEND RECOLLECT TO VERIFY  --  35*  --  60  BILITOT QUESTIONABLE RESULTS, RECOMMEND RECOLLECT TO VERIFY  --  0.5  --  0.9  GFRNONAA QUESTIONABLE RESULTS, RECOMMEND RECOLLECT TO VERIFY   < > 50* 44* 48*  GFRAA QUESTIONABLE RESULTS, RECOMMEND RECOLLECT TO VERIFY   < > 58* 52* 56*  ANIONGAP QUESTIONABLE RESULTS, RECOMMEND RECOLLECT TO VERIFY   < > 11 8 11    < > = values in this interval not displayed.     Hematology Recent Labs  Lab 09/09/20 1810 09/10/20 0505 09/11/20 0420  WBC 10.9* 10.6* 7.4  RBC 4.61 4.31 4.27  HGB 15.3 14.7 14.5  HCT 44.4 42.0 42.0  MCV 96.3 97.4 98.4  MCH 33.2 34.1* 34.0  MCHC 34.5 35.0 34.5  RDW 15.2 15.4 15.8*  PLT 76* 78* 69*    BNPNo results for input(s): BNP, PROBNP in the last 168 hours.   DDimer No results for  input(s): DDIMER in the last 168 hours.   Radiology    DG Chest Port 1 View  Result Date: 09/09/2020 CLINICAL DATA:  Shortness of breath and tachycardia EXAM: PORTABLE CHEST 1 VIEW COMPARISON:  05/04/2016 FINDINGS: Cardiac shadow is enlarged but stable. Defibrillator is again noted. The lungs are clear bilaterally. No bony abnormality is seen. IMPRESSION: No acute abnormality noted. Electronically Signed   By: Inez Catalina M.D.   On: 09/09/2020 13:39   ECHOCARDIOGRAM COMPLETE  Result Date: 09/10/2020    ECHOCARDIOGRAM REPORT   Patient Name:   Gregory [name] Date of Exam: 09/10/2020 Medical Rec #:  [id number]                 Height:       78.0 in Accession #:    [id number]                Weight:       285.1 lb Date of Birth:  1961/01/30                 BSA:          2.624 m Patient Age:    30 years                  BP:           106/74 mmHg Patient Gender: M                         HR:           97 bpm. Exam Location:  Inpatient Procedure: 2D Echo, Cardiac Doppler, Color Doppler and Intracardiac            Opacification Agent Indications:    Cardiomyopathy-Unspecified 425.9 / I42.9  History:        Patient has prior history of Echocardiogram examinations, most                 recent 06/08/2018. CHF and Cardiomyopathy, CAD; Risk                 Factors:Hypertension, Dyslipidemia, Sleep Apnea and Former                 Smoker.  Sonographer:    Vickie Epley RDCS Referring Phys: [id number] [place]  Sonographer Comments: Technically difficult study due to poor echo windows and suboptimal subcostal window. IMPRESSIONS  1. Left ventricular ejection fraction, by estimation, is 55 to 60%. The  left ventricle has normal function. The left ventricle has no regional wall motion abnormalities. Left ventricular diastolic function could not be evaluated.  2. Right ventricular systolic function is normal. The right ventricular size is normal.  3. Left atrial size was mildly dilated.  4. The mitral valve  is normal in structure. Trivial mitral valve regurgitation. No evidence of mitral stenosis.  5. The aortic valve is normal in structure. Aortic valve regurgitation is not visualized. Mild to moderate aortic valve sclerosis/calcification is present, without any evidence of aortic stenosis.  6. The inferior vena cava is normal in size with greater than 50% respiratory variability, suggesting right atrial pressure of 3 mmHg. FINDINGS  Left Ventricle: Left ventricular ejection fraction, by estimation, is 55 to 60%. The left ventricle has normal function. The left ventricle has no regional wall motion abnormalities. Definity contrast agent was given IV to delineate the left ventricular  endocardial borders. The left ventricular internal cavity size was normal in size. There is no left ventricular hypertrophy. Left ventricular diastolic function could not be evaluated due to atrial fibrillation. Left ventricular diastolic function could  not be evaluated.  LV Wall Scoring: Right Ventricle: The right ventricular size is normal. No increase in right ventricular wall thickness. Right ventricular systolic function is normal. Left Atrium: Left atrial size was mildly dilated. Right Atrium: Right atrial size was normal in size. Pericardium: There is no evidence of pericardial effusion. Mitral Valve: The mitral valve is normal in structure. Trivial mitral valve regurgitation. No evidence of mitral valve stenosis. Tricuspid Valve: The tricuspid valve is normal in structure. Tricuspid valve regurgitation is mild . No evidence of tricuspid stenosis. Aortic Valve: The aortic valve is normal in structure. Aortic valve regurgitation is not visualized. Mild to moderate aortic valve sclerosis/calcification is present, without any evidence of aortic stenosis. Pulmonic Valve: The pulmonic valve was normal in structure. Pulmonic valve regurgitation is not visualized. No evidence of pulmonic stenosis. Aorta: The aortic root is normal in size  and structure. Venous: The inferior vena cava is normal in size with greater than 50% respiratory variability, suggesting right atrial pressure of 3 mmHg. IAS/Shunts: No atrial level shunt detected by color flow Doppler. Additional Comments: A pacer wire is visualized in the right ventricle.  LEFT VENTRICLE PLAX 2D LVOT diam:     2.70 cm LV SV:         71 LV SV Index:   27 LVOT Area:     5.73 cm  LEFT ATRIUM             Index       RIGHT ATRIUM           Index LA Vol (A2C):   80.2 ml 30.56 ml/m RA Area:     19.30 cm LA Vol (A4C):   72.4 ml 27.59 ml/m RA Volume:   54.40 ml  20.73 ml/m LA Biplane Vol: 79.8 ml 30.41 ml/m  AORTIC VALVE LVOT Vmax:   84.90 cm/s LVOT Vmean:  56.700 cm/s LVOT VTI:    0.124 m  AORTA Ao Root diam: 4.20 cm  SHUNTS Systemic VTI:  0.12 m Systemic Diam: 2.70 cm Ena Dawley MD Electronically signed by Ena Dawley MD Signature Date/Time: 09/10/2020/2:52:13 PM    Final     Cardiac Studies   Echo 06/08/18: - Left ventricle: Systolic function was normal. The estimated  ejection fraction was in the range of 60% to 65%. Wall motion was  normal; there were no regional wall motion abnormalities.  Features are consistent with a pseudonormal left ventricular  filling pattern, with concomitant abnormal relaxation and  increased filling pressure (grade 2 diastolic dysfunction).  - Left atrium: The atrium was severely dilated.   Cath 05/05/16: 1. Ost RCA to Dist RCA lesion, 100% stenosed. Known occluded. RPDA fills via collaterals with minimal filling of PL system 2. Post Atrio lesion, 80% stenosed. 3. Lat 1st Mrg lesion, 100% stenosed. Appears to be jailed from prior stent. Fills via collaterals 4. Widely patent stent in OM1, D1 and mid LAD 5. There is mild to moderate left ventricular systolic dysfunction. 6. Severely elevated LVEDP   Echo 09/10/20: 1. Left ventricular ejection fraction, by estimation, is 55 to 60%. The  left ventricle has normal function. The  left ventricle has no regional  wall motion abnormalities. Left ventricular diastolic function could not  be evaluated.  2. Right ventricular systolic function is normal. The right ventricular  size is normal.  3. Left atrial size was mildly dilated.  4. The mitral valve is normal in structure. Trivial mitral valve  regurgitation. No evidence of mitral stenosis.  5. The aortic valve is normal in structure. Aortic valve regurgitation is  not visualized. Mild to moderate aortic valve sclerosis/calcification is  present, without any evidence of aortic stenosis.  6. The inferior vena cava is normal in size with greater than 50%  respiratory variability, suggesting right atrial pressure of 3 mmHg.    Patient Profile     59 y.o. male with a hx of CAD, CHF (EF evaluation as low as 45%, but recently 60-65%) and VT s/p ICD who presents with shock   Assessment & Plan    Shock: suspect septic/hypovolemic shock.  Echo shows normal biventricular function, no significant valvular disease.  Renal function improving with IV fluids.   -Continue treatment per PCCM  AFib:  New diagnosis.  Previously was on amiodarone for his VT but was stopped due to isolated elevation of alkaline phosphatase, which has resolved.  Restarted on amiodarone. -Continue IV amiodarone, appears rate controlled.  Monitor LFTs -Continue IV heparin for anticoagulation -EP following, planning DCCV tomorrow  CAD: He has not had any chest pain.  His most recent coronary angiogram was performed in 2017 and showed stable anatomy.  He has known chronic total occlusion of the right coronary artery and the most distal oblique marginal artery branch of the circumflex, and patent stents in the LAD, 1st diagonal and 1st oblique marginal.  Previous nuclear stress testing showed an inferolateral scar, presumably the source of his episodes of sustained ventricular tachycardia.  CHF: Since his initial evaluation in [place] his left  ventricular ejection fraction has never been lower than 45% and his last two echoes from 2017 in 2019 actually documented normal left ventricular ejection fraction.  Repeat echo here shows normal EF.  VT: Last episode of true VT seems to have been in January 2021 and was treated with antitachycardia pacing.  Having runs of NSVT, would continue amiodarone as above and maintain K>4, Mag>2  ICD: He underwent generator change in January 2018.  Device function is normal.  He was programmed with a rather low VT detection at 230 bpm due to his previous history of "slow VT".  Since he was in atrial fibrillation with rapid ventricular response on admission, Dr Sallyanne Kuster increased the minimum rate of VT detection 250 bpm to avoid unnecessary therapy for atrial fibrillation with RVR.  EP following.   For questions or updates, please contact [place] Please consult  [email or web address] for contact info under       Signed, Donato Heinz, MD  09/11/2020, 8:47 AM

## 2020-09-11 NOTE — Progress Notes (Signed)
Pt refusing cpap for the night. ?

## 2020-09-11 NOTE — Progress Notes (Signed)
[place] Progress Note Patient Name: Gregory [name] DOB: [birth year] MRN: [id number]   Date of Service  09/11/2020  HPI/Events of Note  K+ = 3.4, CA++ = 5.8 and Creatinine = 1.64.   eICU Interventions  Will replace K+ and Ca++.      Intervention Category Major Interventions: Electrolyte abnormality - evaluation and management  [name] 09/11/2020, 12:17 AM

## 2020-09-11 NOTE — Progress Notes (Signed)
Patient critical lab values: K-3.4 Ca-5.8  [place] RN Kathlee Nations notified on 9/28 @ 0009 and will pass lab values to MD Oletta Darter.

## 2020-09-11 NOTE — Progress Notes (Signed)
[place] Progress Note Patient Name: Gregory [name] DOB: [birth year] MRN: [id number]   Date of Service  09/11/2020  HPI/Events of Note  Patient anxious, can't sleep and refuses to wear CPAP ordered earlier. Melatonin not effective for sleep.   eICU Interventions  Plan: 1. Xanax 0.25 mg PO X 1 now.      Intervention Category Major Interventions: Other:  Hilarie Sinha Cornelia Copa 09/11/2020, 2:14 AM

## 2020-09-11 NOTE — Progress Notes (Addendum)
Dr. Caryl Comes has seen and examined the patient this morning, his note to follow Recommended and discussed with patient DCCV tomorrow, no TEE needed given heparin started within 48hrs of onset of his AF Placed on schedule for 09/12/20 at 57 Orders written  Tommye Standard, PA-C

## 2020-09-11 NOTE — Progress Notes (Signed)
ANTICOAGULATION CONSULT NOTE  Pharmacy Consult for Heparin Indication: atrial fibrillation  No Known Allergies  Patient Measurements: Height: 6\' 6"  (198.1 cm) Weight: 129.3 kg (285 lb 0.9 oz) IBW/kg (Calculated) : 91.4 Heparin Dosing Weight: 118kg  Vital Signs: Temp: 98 F (36.7 C) (09/28 0443) Temp Source: Oral (09/28 0443) BP: 105/75 (09/28 0400) Pulse Rate: 83 (09/28 0400)  Labs: Recent Labs    09/09/20 1336 09/09/20 1758 09/09/20 1810 09/10/20 0031 09/10/20 0505 09/10/20 0505 09/10/20 0725 09/10/20 1407 09/10/20 1645 09/10/20 2246 09/11/20 0420  HGB 16.6   < > 15.3  --  14.7  --   --   --   --   --  14.5  HCT 48.3   < > 44.4  --  42.0  --   --   --   --   --  42.0  PLT PLATELET CLUMPS NOTED ON SMEAR, UNABLE TO ESTIMATE  --  76*  --  78*  --   --   --   --   --  69*  APTT  --   --  33  --   --   --   --   --   --   --   --   LABPROT  --   --  16.5*  --   --   --   --   --   --   --   --   INR  --   --  1.4*  --   --   --   --   --   --   --   --   HEPARINUNFRC  --   --   --    < >  --   --  0.19*  --  <0.10*  --  0.41  CREATININE 5.60*  --  4.48*  --  3.05*   < >  --  QUESTIONABLE RESULTS, RECOMMEND RECOLLECT TO VERIFY 1.48* 1.64*  --   TROPONINIHS 48*  --   --   --   --   --   --   --   --   --   --    < > = values in this interval not displayed.    Estimated Creatinine Clearance: 71.3 mL/min (A) (by C-G formula based on SCr of 1.64 mg/dL (H)).   Medical History: Past Medical History:  Diagnosis Date  . CAD (coronary artery disease) 12/01/2013  . Chronic combined systolic and diastolic CHF, NYHA class 1 ([place]) 12/01/2013  . Erectile dysfunction 12/01/2013  . HTN (hypertension) 12/01/2013  . Hyperlipidemia 12/01/2013  . Ischemic cardiomyopathy 12/01/2013  . Sleep apnea      Assessment: 20 yoM admitted with AFib RVR started on IV heparin. Initial heparin level therapeutic, now undetectable. CBC stable, pltc low since admit.  9/28 AM update:  Heparin  level therapeutic after rate increase  Goal of Therapy:  Heparin level 0.3-0.7 units/ml Monitor platelets by anticoagulation protocol: Yes   Plan:  Cont heparin 2300 units/hr 1200 heparin level  Narda Bonds, PharmD, BCPS Clinical Pharmacist Phone: [phone]

## 2020-09-11 NOTE — Progress Notes (Signed)
Patient Name: Gregory [name]    Patient Profile:   Gregory [name] is a 59 y.o. male with a hx of CAD (knonw chronic occl of RCA with prior PCIs to LAD, diag, and LCx), VT, ICM,  OSA w/CPAP, seizures, chronic CHF, HLD, HTN, obesity admitted with shock ? Hypovolemia / ? Septic w new AFib, h/o VT,    Echo  ?LOST  LAbs albumin 1.9, Ca < 4 on arrival  K 2.5   Off pressors    SUBJECTIVE: Feeling better.  Somewhat short of breath at rest.  No chest pain.  Past Medical History:  Diagnosis Date  . CAD (coronary artery disease) 12/01/2013  . Chronic combined systolic and diastolic CHF, NYHA class 1 ([facility]) 12/01/2013  . Erectile dysfunction 12/01/2013  . HTN (hypertension) 12/01/2013  . Hyperlipidemia 12/01/2013  . Ischemic cardiomyopathy 12/01/2013  . Sleep apnea     Scheduled Meds:  Scheduled Meds: . Chlorhexidine Gluconate Cloth  6 each Topical Daily  . insulin aspart  0-20 Units Subcutaneous TID WC  . insulin aspart  0-5 Units Subcutaneous QHS  . melatonin  5 mg Oral QHS  . pantoprazole (PROTONIX) IV  40 mg Intravenous QHS  . promethazine  25 mg Intravenous Once  . sodium chloride flush  10-40 mL Intracatheter Q12H   Continuous Infusions: . sodium chloride Stopped (09/10/20 0103)  . sodium chloride 10 mL/hr at 09/11/20 0400  . amiodarone 30 mg/hr (09/11/20 0400)  . calcium gluconate 1,000 mg (09/11/20 4403)  . heparin 2,300 Units/hr (09/11/20 0525)  . lactated ringers    . lactated ringers Stopped (09/11/20 0228)  . norepinephrine (LEVOPHED) Adult infusion 2 mcg/min (09/11/20 0400)   docusate sodium, polyethylene glycol, sodium chloride flush    PHYSICAL EXAM Vitals:   09/11/20 0300 09/11/20 0400 09/11/20 0443 09/11/20 0500  BP: 112/79 105/75  108/81  Pulse: 81 83  92  Resp: 18 (!) 22  (!) 26  Temp:   98 F (36.7 C)   TempSrc:   Oral   SpO2: 93% 97%  100%  Weight:    135.3 kg  Height:       Well developed and nourished in no  acute distress HENT normal Neck supple with JVP-  Clear Regular rate and rhythm, no murmurs or gallops Abd-soft with active BS No Clubbing cyanosis edema Skin-warm and dry A & Oriented  Grossly normal sensory and motor function    TELEMETRY: Reviewed personnally pt in atrial fibrillation with heart rate mean about 90, some nonsustained VT:     Intake/Output Summary (Last 24 hours) at 09/11/2020 0720 Last data filed at 09/11/2020 0525 Gross per 24 hour  Intake 4012.15 ml  Output 3075 ml  Net 937.15 ml    LABS: Basic Metabolic Panel: Recent Labs  Lab 09/09/20 1336 09/09/20 1336 09/09/20 1758 09/09/20 1810 09/09/20 1810 09/10/20 0505 09/10/20 0505 09/10/20 1407 09/10/20 1407 09/10/20 1645 09/10/20 1645 09/10/20 2246 09/11/20 0420  NA 130*   < > 132* 132*  --  131*  --  QUESTIONABLE RESULTS, RECOMMEND RECOLLECT TO VERIFY  --  138  --  136 136  K 3.1*   < > 3.0* 3.4*  --  2.7*  --  QUESTIONABLE RESULTS, RECOMMEND RECOLLECT TO VERIFY  --  2.5*  --  3.4* 3.6  CL 95*  --   --  98  --  100  --  QUESTIONABLE RESULTS, RECOMMEND RECOLLECT TO VERIFY  --  110  --  105 104  CO2 16*  --   --  18*  --  18*  --  QUESTIONABLE RESULTS, RECOMMEND RECOLLECT TO VERIFY  --  17*  --  23 21*  GLUCOSE 189*  --   --  187*  --  225*  --  QUESTIONABLE RESULTS, RECOMMEND RECOLLECT TO VERIFY  --  206*  --  137* 142*  BUN 55*  --   --  54*  --  50*  --  QUESTIONABLE RESULTS, RECOMMEND RECOLLECT TO VERIFY  --  35*  --  34* 32*  CREATININE 5.60*  --   --  4.48*  --  3.05*  --  QUESTIONABLE RESULTS, RECOMMEND RECOLLECT TO VERIFY  --  1.48*  --  1.64* 1.53*  CALCIUM 5.2*   < >  --  5.4*   < > 5.1*   < > QUESTIONABLE RESULTS, RECOMMEND RECOLLECT TO VERIFY   < > <4.0*   < > 5.8* 6.1*  MG 1.6*   < >  --  1.4*   < > 2.2  --   --   --   --   --   --  2.1  PHOS  --   --   --  4.5  --   --   --   --   --   --   --   --  2.1*   < > = values in this interval not displayed.   Cardiac Enzymes: No results for  input(s): CKTOTAL, CKMB, CKMBINDEX, TROPONINI in the last 72 hours. CBC: Recent Labs  Lab 09/09/20 1336 09/09/20 1758 09/09/20 1810 09/10/20 0505 09/11/20 0420  WBC 10.6*  --  10.9* 10.6* 7.4  NEUTROABS 8.7*  --   --   --   --   HGB 16.6 15.6 15.3 14.7 14.5  HCT 48.3 46.0 44.4 42.0 42.0  MCV 98.8  --  96.3 97.4 98.4  PLT PLATELET CLUMPS NOTED ON SMEAR, UNABLE TO ESTIMATE  --  76* 78* 69*   PROTIME: Recent Labs    09/09/20 1810  LABPROT 16.5*  INR 1.4*   Liver Function Tests: Recent Labs    09/10/20 1645 09/11/20 0420  AST 37 44*  ALT 23 31  ALKPHOS 35* 60  BILITOT 0.5 0.9  PROT 3.7* 5.3*  ALBUMIN 1.3* 1.9*   Recent Labs    09/09/20 1810  LIPASE 120*  AMYLASE 98   BNP: BNP (last 3 results) No results for input(s): BNP in the last 8760 hours.  ProBNP (last 3 results) No results for input(s): PROBNP in the last 8760 hours.  D-Dimer: No results for input(s): DDIMER in the last 72 hours. Hemoglobin A1C: Recent Labs    09/10/20 1645  HGBA1C 6.3*   Fasting Lipid Panel: No results for input(s): CHOL, HDL, LDLCALC, TRIG, CHOLHDL, LDLDIRECT in the last 72 hours. Thyroid Function Tests: Recent Labs    09/10/20 1407  TSH 0.747   Anemia Panel: No results for input(s): VITAMINB12, FOLATE, FERRITIN, TIBC, IRON, RETICCTPCT in the last 72 hours.   Device Interrogation:    ASSESSMENT AND PLAN: Atrial fibrillation-persistent  Shock question hypovolemic versus septic  Acute renal failure  Coronary artery disease with prior PCI; resolved ischemic cardiomyopathy  Congestive heart failure-chronic-systolic/diastolic  Ventricular tachycardia  ICD-[employer]  Hypoalbuminemia   Hypokalemia  Hypocalcemia   Thrombocytopenia    There are a series of acute metabolic/other derangements concurrent with this episode of shock including all of which beg an explanation - and  are not supported by simple hypovolemia 2/2 diarrhea--have reviewed with Dr.  Tamala Julian.  It is his impression that much of this can be ascribed to acute shock.. Hypoalbuminemia  ( T1/2- 20d) Hypocalcemia Hypokalemia thrombocytopenia  Plan to cardiovert tomorrow if he has not reverted on his own; will need to be transitioned to a NOAC.  Signed, Virl Axe MD  09/11/2020

## 2020-09-11 NOTE — Progress Notes (Signed)
ANTICOAGULATION CONSULT NOTE  Pharmacy Consult for Heparin Indication: atrial fibrillation  No Known Allergies  Patient Measurements: Height: 6\' 6"  (198.1 cm) Weight: 135.3 kg (298 lb 4.5 oz) IBW/kg (Calculated) : 91.4 Heparin Dosing Weight: 118kg  Vital Signs: Temp: 98.1 F (36.7 C) (09/28 1100) Temp Source: Oral (09/28 1100) BP: 88/63 (09/28 1200) Pulse Rate: 81 (09/28 1200)  Labs: Recent Labs    09/09/20 1336 09/09/20 1758 09/09/20 1810 09/10/20 0031 09/10/20 0505 09/10/20 0725 09/10/20 1645 09/10/20 2246 09/11/20 0420 09/11/20 1208  HGB 16.6   < > 15.3  --  14.7  --   --   --  14.5  --   HCT 48.3   < > 44.4  --  42.0  --   --   --  42.0  --   PLT PLATELET CLUMPS NOTED ON SMEAR, UNABLE TO ESTIMATE  --  76*  --  78*  --   --   --  69*  --   APTT  --   --  33  --   --   --   --   --   --   --   LABPROT  --   --  16.5*  --   --   --   --   --   --   --   INR  --   --  1.4*  --   --   --   --   --   --   --   HEPARINUNFRC  --   --   --    < >  --    < > <0.10*  --  0.41 0.34  CREATININE 5.60*  --  4.48*  --  3.05*   < > 1.48* 1.64* 1.53*  --   TROPONINIHS 48*  --   --   --   --   --   --   --   --   --    < > = values in this interval not displayed.    Estimated Creatinine Clearance: 78.2 mL/min (A) (by C-G formula based on SCr of 1.53 mg/dL (H)).   Medical History: Past Medical History:  Diagnosis Date  . CAD (coronary artery disease) 12/01/2013  . Chronic combined systolic and diastolic CHF, NYHA class 1 ([place]) 12/01/2013  . Erectile dysfunction 12/01/2013  . HTN (hypertension) 12/01/2013  . Hyperlipidemia 12/01/2013  . Ischemic cardiomyopathy 12/01/2013  . Sleep apnea      Assessment: 3 yoM admitted with AFib RVR started on IV heparin. CBC stable, pltc low since admit. Repeat heparin level today continues to be therapeutic at 0.34.  9/28 AM update:  Heparin level therapeutic after rate increase  Goal of Therapy:  Heparin level 0.3-0.7  units/ml Monitor platelets by anticoagulation protocol: Yes   Plan:  Cont heparin 2300 units/hr Daily Heparin Level and CBC Stop Heparin if platelets drop below 50 per Dr. Derek Jack, PharmD PGY1 Pharmacy Resident 09/11/2020 1:03 PM

## 2020-09-12 ENCOUNTER — Inpatient Hospital Stay (HOSPITAL_COMMUNITY): Payer: Worker's Compensation | Admitting: Anesthesiology

## 2020-09-12 ENCOUNTER — Encounter (HOSPITAL_COMMUNITY): Payer: Self-pay | Admitting: Internal Medicine

## 2020-09-12 ENCOUNTER — Encounter (HOSPITAL_COMMUNITY)
Admission: EM | Disposition: A | Discharge status: Rehabilitation Facility | Payer: Self-pay | Source: Home / Self Care | Attending: Internal Medicine

## 2020-09-12 DIAGNOSIS — I4819 Other persistent atrial fibrillation: Principal | ICD-10-CM

## 2020-09-12 DIAGNOSIS — D696 Thrombocytopenia, unspecified: Secondary | ICD-10-CM

## 2020-09-12 LAB — CBC
HCT: 41.8 % (ref 39.0–52.0)
Hemoglobin: 14 g/dL (ref 13.0–17.0)
MCH: 33.3 pg (ref 26.0–34.0)
MCHC: 33.5 g/dL (ref 30.0–36.0)
MCV: 99.5 fL (ref 80.0–100.0)
Platelets: 53 10*3/uL — ABNORMAL LOW (ref 150–400)
RBC: 4.2 MIL/uL — ABNORMAL LOW (ref 4.22–5.81)
RDW: 16.7 % — ABNORMAL HIGH (ref 11.5–15.5)
WBC: 6.8 10*3/uL (ref 4.0–10.5)
nRBC: 1.5 % — ABNORMAL HIGH (ref 0.0–0.2)

## 2020-09-12 LAB — GLUCOSE, CAPILLARY
Glucose-Capillary: 133 mg/dL — ABNORMAL HIGH (ref 70–99)
Glucose-Capillary: 150 mg/dL — ABNORMAL HIGH (ref 70–99)
Glucose-Capillary: 103 mg/dL — ABNORMAL HIGH (ref 70–99)
Glucose-Capillary: 132 mg/dL — ABNORMAL HIGH (ref 70–99)
Glucose-Capillary: 134 mg/dL — ABNORMAL HIGH (ref 70–99)

## 2020-09-12 LAB — BASIC METABOLIC PANEL
Anion gap: 11 (ref 5–15)
BUN: 20 mg/dL (ref 8–23)
CO2: 21 mmol/L — ABNORMAL LOW (ref 22–32)
Calcium: 6.4 mg/dL — CL (ref 8.9–10.3)
Chloride: 103 mmol/L (ref 98–111)
Creatinine, Ser: 1.07 mg/dL (ref 0.61–1.24)
GFR calc Af Amer: >60 mL/min (ref >60)
GFR calc non Af Amer: >60 mL/min (ref >60)
Glucose, Bld: 144 mg/dL — ABNORMAL HIGH (ref 70–99)
Potassium: 3.7 mmol/L (ref 3.5–5.1)
Sodium: 135 mmol/L (ref 135–145)

## 2020-09-12 LAB — HEPARIN LEVEL (UNFRACTIONATED): Heparin Unfractionated: 0.33 IU/mL (ref 0.30–0.70)

## 2020-09-12 LAB — MAGNESIUM: Magnesium: 2.1 mg/dL (ref 1.7–2.4)

## 2020-09-12 LAB — PHOSPHORUS: Phosphorus: 2.2 mg/dL — ABNORMAL LOW (ref 2.5–4.6)

## 2020-09-12 SURGERY — CANCELLED PROCEDURE
Anesthesia: General

## 2020-09-12 MED ORDER — LACTATED RINGERS IV SOLN: INTRAVENOUS | Status: DC

## 2020-09-12 MED ORDER — METOPROLOL TARTRATE 25 MG PO TABS
25.0000 mg | ORAL_TABLET | Freq: Two times a day (BID) | ORAL | Status: DC
  Administered 2020-09-12 (×2): 25 mg via ORAL
  Filled 2020-09-12 (×2): qty 1

## 2020-09-12 MED ORDER — POTASSIUM CHLORIDE CRYS ER 20 MEQ PO TBCR
40.0000 meq | EXTENDED_RELEASE_TABLET | Freq: Once | ORAL | Status: AC
  Administered 2020-09-12: 40 meq via ORAL
  Filled 2020-09-12: qty 2

## 2020-09-12 MED ORDER — POTASSIUM CHLORIDE 10 MEQ/100ML IV SOLN
10.0000 meq | INTRAVENOUS | Status: DC
  Administered 2020-09-12: 10 meq via INTRAVENOUS
  Filled 2020-09-12: qty 100

## 2020-09-12 MED ORDER — SODIUM CHLORIDE 0.9% FLUSH: 10.0000 mL | INTRAVENOUS | Status: DC | PRN

## 2020-09-12 MED ORDER — CALCIUM GLUCONATE-NACL 1-0.675 GM/50ML-% IV SOLN
1.0000 g | Freq: Once | INTRAVENOUS | Status: AC
  Administered 2020-09-12: 1000 mg via INTRAVENOUS
  Filled 2020-09-12: qty 50

## 2020-09-12 MED ORDER — ACETAMINOPHEN 325 MG PO TABS
650.0000 mg | ORAL_TABLET | ORAL | Status: DC | PRN
  Administered 2020-09-12 – 2020-09-28 (×16): 650 mg via ORAL
  Filled 2020-09-12 (×17): qty 2

## 2020-09-12 MED ORDER — DIPHENHYDRAMINE HCL 25 MG PO CAPS
25.0000 mg | ORAL_CAPSULE | Freq: Once | ORAL | Status: AC
  Administered 2020-09-12: 25 mg via ORAL
  Filled 2020-09-12: qty 1

## 2020-09-12 MED ORDER — POTASSIUM PHOSPHATES 15 MMOLE/5ML IV SOLN
30.0000 mmol | Freq: Once | INTRAVENOUS | Status: AC
  Administered 2020-09-12: 30 mmol via INTRAVENOUS
  Filled 2020-09-12: qty 10

## 2020-09-12 NOTE — Progress Notes (Signed)
Patient refusing CPAP for tonight. 

## 2020-09-12 NOTE — Progress Notes (Signed)
[facility] Progress Note Patient Name: Guhan Bruington DOB: [birth year] MRN: [id number]   Date of Service  09/12/2020  HPI/Events of Note  Hypokalemia - K+ = 3.7 and Creatinine = 1.07. Hypocalcemia - Ca++ = 6.4 which corrects to 8.08 (Low) given albumin = 1.9.   eICU Interventions  Replace K+ and Ca++.     Intervention Category Major Interventions: Electrolyte abnormality - evaluation and management  Remedy Corporan Eugene 09/12/2020, 5:49 AM

## 2020-09-12 NOTE — Progress Notes (Addendum)
Progress Note  Patient Name: Gregory [name] Date of Encounter: 09/12/2020  [facility] Cardiologist: Sanda Klein, MD   Subjective   No CP, no palpitations, feels much better then time of admission, denies SOB  Inpatient Medications    Scheduled Meds: . Chlorhexidine Gluconate Cloth  6 each Topical Daily  . insulin aspart  0-20 Units Subcutaneous TID WC  . insulin aspart  0-5 Units Subcutaneous QHS  . levETIRAcetam  500 mg Oral BID  . pantoprazole (PROTONIX) IV  40 mg Intravenous QHS  . sodium chloride flush  10-40 mL Intracatheter Q12H   Continuous Infusions: . albumin human 25 g (09/11/20 2156)  . amiodarone 30 mg/hr (09/12/20 0359)  . calcium gluconate    . heparin 2,300 Units/hr (09/12/20 0314)  . lactated ringers 1,000 mL (09/12/20 0054)  . norepinephrine (LEVOPHED) Adult infusion Stopped (09/11/20 0421)  . potassium chloride     PRN Meds: docusate sodium, polyethylene glycol, sodium chloride flush, zolpidem   Vital Signs    Vitals:   09/11/20 2200 09/11/20 2300 09/12/20 0000 09/12/20 0100  BP: 106/75 108/86 119/78 112/79  Pulse: (!) 106 (!) 104 (!) 106 (!) 106  Resp: (!) 24 19 20  (!) 22  Temp:    98.2 F (36.8 C)  TempSrc:    Oral  SpO2: (!) 88% 93% 91% 93%  Weight:      Height:        Intake/Output Summary (Last 24 hours) at 09/12/2020 0728 Last data filed at 09/12/2020 0515 Gross per 24 hour  Intake 1303.4 ml  Output 525 ml  Net 778.4 ml   Last 3 Weights 09/11/2020 09/09/2020 01/06/2020  Weight (lbs) 298 lb 4.5 oz 285 lb 0.9 oz 296 lb  Weight (kg) 135.3 kg 129.3 kg 134.265 kg      Telemetry    AFib 90's-110's, occasionally frequent PVCs, one NSVT 8beats - Personally Reviewed  ECG    Coarse AFib/flutter, 100bpm, PVC - Personally Reviewed  Physical Exam   GEN: No acute distress.   Neck: No JVD Cardiac: irreg-irreg, no murmurs, rubs, or gallops.  Respiratory: Clear to auscultation bilaterally. GI: Soft, nontender MS: No  edema; No deformity. Neuro:  Nonfocal  Psych: Normal affect   Labs    High Sensitivity Troponin:   Recent Labs  Lab 09/09/20 1336  TROPONINIHS 48*      Chemistry Recent Labs  Lab 09/10/20 1407 09/10/20 1407 09/10/20 1645 09/10/20 1645 09/10/20 2246 09/11/20 0420 09/12/20 0337  NA QUESTIONABLE RESULTS, RECOMMEND RECOLLECT TO VERIFY   < > 138   < > 136 136 135  K QUESTIONABLE RESULTS, RECOMMEND RECOLLECT TO VERIFY   < > 2.5*   < > 3.4* 3.6 3.7  CL QUESTIONABLE RESULTS, RECOMMEND RECOLLECT TO VERIFY   < > 110   < > 105 104 103  CO2 QUESTIONABLE RESULTS, RECOMMEND RECOLLECT TO VERIFY   < > 17*   < > 23 21* 21*  GLUCOSE QUESTIONABLE RESULTS, RECOMMEND RECOLLECT TO VERIFY   < > 206*   < > 137* 142* 144*  BUN QUESTIONABLE RESULTS, RECOMMEND RECOLLECT TO VERIFY   < > 35*   < > 34* 32* 20  CREATININE QUESTIONABLE RESULTS, RECOMMEND RECOLLECT TO VERIFY   < > 1.48*   < > 1.64* 1.53* 1.07  CALCIUM QUESTIONABLE RESULTS, RECOMMEND RECOLLECT TO VERIFY   < > <4.0*   < > 5.8* 6.1* 6.4*  PROT QUESTIONABLE RESULTS, RECOMMEND RECOLLECT TO VERIFY  --  3.7*  --   --  5.3*  --   ALBUMIN QUESTIONABLE RESULTS, RECOMMEND RECOLLECT TO VERIFY  --  1.3*  --   --  1.9*  --   AST QUESTIONABLE RESULTS, RECOMMEND RECOLLECT TO VERIFY  --  37  --   --  44*  --   ALT QUESTIONABLE RESULTS, RECOMMEND RECOLLECT TO VERIFY  --  23  --   --  31  --   ALKPHOS QUESTIONABLE RESULTS, RECOMMEND RECOLLECT TO VERIFY  --  35*  --   --  60  --   BILITOT QUESTIONABLE RESULTS, RECOMMEND RECOLLECT TO VERIFY  --  0.5  --   --  0.9  --   GFRNONAA QUESTIONABLE RESULTS, RECOMMEND RECOLLECT TO VERIFY   < > 50*   < > 44* 48* >60  GFRAA QUESTIONABLE RESULTS, RECOMMEND RECOLLECT TO VERIFY   < > 58*   < > 52* 56* >60  ANIONGAP QUESTIONABLE RESULTS, RECOMMEND RECOLLECT TO VERIFY   < > 11   < > 8 11 11    < > = values in this interval not displayed.     Hematology Recent Labs  Lab 09/10/20 0505 09/11/20 0420 09/12/20 0337  WBC 10.6*  7.4 6.8  RBC 4.31 4.27 4.20*  HGB 14.7 14.5 14.0  HCT 42.0 42.0 41.8  MCV 97.4 98.4 99.5  MCH 34.1* 34.0 33.3  MCHC 35.0 34.5 33.5  RDW 15.4 15.8* 16.7*  PLT 78* 69* 53*    BNPNo results for input(s): BNP, PROBNP in the last 168 hours.   DDimer No results for input(s): DDIMER in the last 168 hours.   Radiology    DG Chest 1 View Result Date: 09/11/2020 CLINICAL DATA:  Short of breath, CHF. EXAM: CHEST  1 VIEW COMPARISON:  09/09/2020 FINDINGS: Cardiac enlargement. AICD unchanged. Negative for heart failure or edema Progression of left lower lobe airspace disease with elevated left hemidiaphragm. Likely atelectasis or pneumonia. No effusion IMPRESSION: Interval development of mild left lower lobe airspace disease consistent with atelectasis or pneumonia. Electronically Signed   By: Franchot Gallo M.D.   On: 09/11/2020 11:52     US Abdomen Limited RUQ Result Date: 09/11/2020 CLINICAL DATA:  Cirrhosis. EXAM: ULTRASOUND ABDOMEN LIMITED RIGHT UPPER QUADRANT COMPARISON:  None. FINDINGS: Gallbladder: A 1.0 cm shadowing echogenic gallstone is seen within the gallbladder lumen. There is no evidence of gallbladder wall thickening (2.0 mm). No sonographic Murphy sign noted by sonographer. Common bile duct: Diameter: 3.3 mm Liver: No focal lesion identified. There is diffusely increased echogenicity of the liver parenchyma. Portal vein is patent on color Doppler imaging with normal direction of blood flow towards the liver. Other: None. IMPRESSION: 1. Cholelithiasis, without evidence of acute cholecystitis. 2. Fatty liver. Electronically Signed   By: Virgina Norfolk M.D.   On: 09/11/2020 16:19    Cardiac Studies   09/10/2020: TTE IMPRESSIONS  1. Left ventricular ejection fraction, by estimation, is 55 to 60%. The  left ventricle has normal function. The left ventricle has no regional  wall motion abnormalities. Left ventricular diastolic function could not  be evaluated.  2. Right ventricular  systolic function is normal. The right ventricular  size is normal.  3. Left atrial size was mildly dilated.  4. The mitral valve is normal in structure. Trivial mitral valve  regurgitation. No evidence of mitral stenosis.  5. The aortic valve is normal in structure. Aortic valve regurgitation is  not visualized. Mild to moderate aortic valve sclerosis/calcification is  present, without any evidence of aortic stenosis.  6. The inferior vena cava is normal in size with greater than 50%  respiratory variability, suggesting right atrial pressure of 3 mmHg.  06/08/2018: TTE Study Conclusions - Left ventricle: Systolic function was normal. The estimated  ejection fraction was in the range of 60% to 65%. Wall motion was  normal; there were no regional wall motion abnormalities.  Features are consistent with a pseudonormal left ventricular  filling pattern, with concomitant abnormal relaxation and  increased filling pressure (grade 2 diastolic dysfunction).  - Left atrium: The atrium was severely dilated.    Cardiac cath 05/05/2016  1. Ost RCA to Dist RCA lesion, 100% stenosed. Known occluded. RPDA fills via collaterals with minimal filling of PL system 2. Post Atrio lesion, 80% stenosed. 3. Lat 1st Mrg lesion, 100% stenosed. Appears to be jailed from prior stent. Fills via collaterals 4. Widely patent stent in OM1, D1 and mid LAD 5. There is mild to moderate left ventricular systolic dysfunction. 6. Severely elevated LVEDP  No new potential culprit lesion visualized to explain the patient's presentation with ventricular tachycardiac. There is a but appears to be chronically occluded branch of an OM as well as known occlusion of the RCA.  He has known moderately reduced EF with now severely elevated LVEDP.  Plan:  Return to TCU for continued care.  Consider diuresis for elevated LVEDP. This is in the setting of normal to low systemic pressures.  Continue to titrate  antiarrhythmic medications.    Patient Profile     59 y.o. male w/PMHx of CAD (knonw chronic occl of RCA with prior PCIs to LAD, diag, and LCx), VT, ICM,  OSA w/CPAP, seizures, chronic CHF, HLD, HTN, obesity  admitted to [name] 9/26 with progressive weakness, orthostatic dizziness, and a syncopal event, He was markedly hypotensive, started on IVF with agressive replacement as well a levophed, felt to be in shock (unceratin etiology septic vs hypovolemic) also noted to be in new AFib  Assessment & Plan     1. New Afib in setting of shock     heparin gtt started     amio for rate      CHA2DS2Vasc is 3     Will need transition to [facility] prior to d/c  Remains in Afib 90's-110's Dr. Caryl Comes in review of device interrogation, time of AFib onset and start of heparin gtt does not think TEE is required given anticoagulated within 48hours of onset of his Af DCCV scheduled this morning Will d/w Dr. Caryl Comes +/- amio going forward, suspect we will not pursue this   2. Shock     off pressor yesterday AM     BC x2 are neg 3 days     afebrile     Some diarrhea reported at home though no significant drop off in oral inteke per the pt     remains on LR     cumulatively fluid + 10,947L     electrolyte derangement  C/w CCM    3. AKI     much better  4. Hx of VT      Has MDT ICD     Intermittently with increased frequency of PVCs, one NSVT 8beats     Preserved BiVe function   5. CAD     Felt stable     Cards following   6. CHF     Appears a little SOB, though he denies (appears similar to yesterday AM)     Declined CPAP yesterday but wore O2  CXR yesterday mid-day without mention of edema or effusion     Lungs sound quite clear, no edema     O2 sats ok      Will defer to CCM and gen cards of need for any diuresis    ADDEND Dr. Debara Pickett noted downward trend in platelets, pending HIT panel. Given concerns we may need to interrupt his anticoagulation, opted to postpone his DCCV Dr.  Caryl Comes has seen and examined the patient this morning HR 110's in AFib this AM, we will start lopressor 25mg  BID with normalized BP and preserved LVEF Patient c/o his severe and chronic back pain to Dr. Caryl Comes, this is deferred to attending team        For questions or updates, please contact [place] Please consult [email or web address] for contact info under        Signed, Baldwin Jamaica, PA-C  09/12/2020, 7:28 AM

## 2020-09-12 NOTE — H&P (View-Only) (Signed)
   Reviewed pre-procedure labs today - there is a steady decline in platelet count which was >200K, now just over 50K - on IV heparin. HIT panel sent - could be that or related to sepsis. Either way, concerns about ability to adequately anticoagulate if this is an issue and we commit him to anticoagulation with cardioversion after which his stroke risk would increase. D/w Drs. Caryl Comes and Gardiner Rhyme - since he is rate-controlled, will cancel procedure - consider attempt at a later date.  Pixie Casino, MD, [facility], [place] Director of the Advanced Lipid Disorders &  Cardiovascular Risk Reduction Clinic Diplomate of the American Board of Clinical Lipidology Attending Cardiologist  Direct Dial: [phone]  Fax: [phone]  Website:  [email or web address]

## 2020-09-12 NOTE — Progress Notes (Signed)
ANTICOAGULATION CONSULT NOTE  Pharmacy Consult for Heparin Indication: atrial fibrillation  No Known Allergies  Patient Measurements: Height: 6\' 6"  (198.1 cm) Weight: 135.3 kg (298 lb 4.5 oz) IBW/kg (Calculated) : 91.4 Heparin Dosing Weight: 118kg  Vital Signs: Temp: 98.2 F (36.8 C) (09/29 0100) Temp Source: Oral (09/29 0924) BP: 116/85 (09/29 1000) Pulse Rate: 100 (09/29 0924)  Labs: Recent Labs    09/09/20 1336 09/09/20 1758 09/09/20 1810 09/10/20 0031 09/10/20 0505 09/10/20 0725 09/10/20 1645 09/10/20 2246 09/11/20 0420 09/11/20 1208 09/12/20 0337  HGB 16.6   < > 15.3  --  14.7   < >  --   --  14.5  --  14.0  HCT 48.3   < > 44.4  --  42.0  --   --   --  42.0  --  41.8  PLT PLATELET CLUMPS NOTED ON SMEAR, UNABLE TO ESTIMATE  --  76*  --  78*  --   --   --  69*  --  53*  APTT  --   --  33  --   --   --   --   --   --   --   --   LABPROT  --   --  16.5*  --   --   --   --   --   --   --   --   INR  --   --  1.4*  --   --   --   --   --   --   --   --   HEPARINUNFRC  --   --   --    < >  --    < >   < >  --  0.41 0.34 0.33  CREATININE 5.60*  --  4.48*  --  3.05*   < >  --  1.64* 1.53*  --  1.07  TROPONINIHS 48*  --   --   --   --   --   --   --   --   --   --    < > = values in this interval not displayed.    Estimated Creatinine Clearance: 111.8 mL/min (by C-G formula based on SCr of 1.07 mg/dL).   Medical History: Past Medical History:  Diagnosis Date  . CAD (coronary artery disease) 12/01/2013  . Chronic combined systolic and diastolic CHF, NYHA class 1 ([place]) 12/01/2013  . Erectile dysfunction 12/01/2013  . HTN (hypertension) 12/01/2013  . Hyperlipidemia 12/01/2013  . Ischemic cardiomyopathy 12/01/2013  . Sleep apnea      Assessment: 40 yoM admitted with AFib RVR started on IV heparin. CBC stable, pltc low since admit. Heparin level today continues to be therapeutic at 0.33. No bleeding issues per nursing or team rounds.   Goal of Therapy:  Heparin  level 0.3-0.7 units/ml Monitor platelets by anticoagulation protocol: Yes   Plan:  Cont heparin 2300 units/hr Daily Heparin Level and CBC  Norina Buzzard, PharmD PGY1 Pharmacy Resident 09/12/2020 10:52 AM

## 2020-09-12 NOTE — Progress Notes (Signed)
   Reviewed pre-procedure labs today - there is a steady decline in platelet count which was >200K, now just over 50K - on IV heparin. HIT panel sent - could be that or related to sepsis. Either way, concerns about ability to adequately anticoagulate if this is an issue and we commit him to anticoagulation with cardioversion after which his stroke risk would increase. D/w Drs. Caryl Comes and Gardiner Rhyme - since he is rate-controlled, will cancel procedure - consider attempt at a later date.  Pixie Casino, MD, [facility], [place] Director of the Advanced Lipid Disorders &  Cardiovascular Risk Reduction Clinic Diplomate of the American Board of Clinical Lipidology Attending Cardiologist  Direct Dial: [phone]  Fax: [phone]  Website:  [email or web address]

## 2020-09-12 NOTE — Anesthesia Preprocedure Evaluation (Signed)
Anesthesia Evaluation  Patient identified by MRN, date of birth, ID band Patient awake    Reviewed: Allergy & Precautions, NPO status , Patient's Chart, lab work & pertinent test results, reviewed documented beta blocker date and time   Airway Mallampati: III  TM Distance: >3 FB Neck ROM: Full    Dental  (+) Poor Dentition, Teeth Intact, Missing, Dental Advisory Given   Pulmonary sleep apnea , former smoker,    Pulmonary exam normal breath sounds clear to auscultation       Cardiovascular hypertension, Pt. on medications and Pt. on home beta blockers + CAD and +CHF  + dysrhythmias Atrial Fibrillation + pacemaker + Cardiac Defibrillator  Rhythm:Irregular Rate:Normal  Echo 09/10/20 1. Left ventricular ejection fraction, by estimation, is 55 to 60%. The left ventricle has normal function. The left ventricle has no regional wall motion abnormalities. Left ventricular diastolic function could not be evaluated.  2. Right ventricular systolic function is normal. The right ventricular size is normal.  3. Left atrial size was mildly dilated.  4. The mitral valve is normal in structure. Trivial mitral valve regurgitation. No evidence of mitral stenosis.  5. The aortic valve is normal in structure. Aortic valve regurgitation is not visualized. Mild to moderate aortic valve sclerosis/calcification is present, without any evidence of aortic stenosis.  6. The inferior vena cava is normal in size with greater than 50% respiratory variability, suggesting right atrial pressure of 3 mmHg.   Hx/o ischemic CM  EKG atrial fibrillation, inferior infarct  Cardiac Cath 04/2016 1. Ost RCA to Dist RCA lesion, 100% stenosed. Known occluded. RPDA fills via collaterals with minimal filling of PL system 2. Post Atrio lesion, 80% stenosed. 3. Lat 1st Mrg lesion, 100% stenosed. Appears to be jailed from prior stent. Fills via collaterals 4. Widely patent  stent in OM1, D1 and mid LAD 5. There is mild to moderate left ventricular systolic dysfunction. 6. Severely elevated LVEDP    No new potential culprit lesion visualized to explain the patient's presentation with ventricular tachycardiac. There is a but appears to be chronically occluded branch of an OM as well as known occlusion of the RCA.  He has known moderately reduced EF with now severely elevated LVEDP.  Plan:  Return to TCU for continued care.  Consider diuresis for elevated LVEDP. This is in the setting of normal to low systemic pressures.  Continue to titrate antiarrhythmic medications.     Neuro/Psych Seizures -, Well Controlled,  negative psych ROS   GI/Hepatic negative GI ROS, Neg liver ROS,   Endo/Other  Hyperlipidemia Obesity  Renal/GU negative Renal ROS   ED    Musculoskeletal negative musculoskeletal ROS (+)   Abdominal (+) + obese,   Peds  Hematology  (+) anemia , On heparin gtt Thrombocytopenia Plavix therapy- last dose 09/09/20   Anesthesia Other Findings   Reproductive/Obstetrics                             Anesthesia Physical Anesthesia Plan  ASA: III  Anesthesia Plan: General   Post-op Pain Management:    Induction: Intravenous  PONV Risk Score and Plan: 2 and Propofol infusion and Treatment may vary due to age or medical condition  Airway Management Planned: Natural Airway and Mask  Additional Equipment:   Intra-op Plan:   Post-operative Plan:   Informed Consent: I have reviewed the patients History and Physical, chart, labs and discussed the procedure including the risks, benefits  and alternatives for the proposed anesthesia with the patient or authorized representative who has indicated his/her understanding and acceptance.     Dental advisory given  Plan Discussed with: CRNA and Anesthesiologist  Anesthesia Plan Comments:         Anesthesia Quick Evaluation

## 2020-09-12 NOTE — Interval H&P Note (Signed)
History and Physical Interval Note:  09/12/2020 9:26 AM  Gregory [name]  has presented today for surgery, with the diagnosis of afib.  The various methods of treatment have been discussed with the patient and family. After consideration of risks, benefits and other options for treatment, the patient has consented to  Procedure(s): CARDIOVERSION (N/A) as a surgical intervention.  The patient's history has been reviewed, patient examined, no change in status, stable for surgery.  I have reviewed the patient's chart and labs.  Questions were answered to the patient's satisfaction.     Pixie Casino

## 2020-09-12 NOTE — Progress Notes (Signed)
[facility] Progress Note Patient Name: Gregory [name] DOB: 03-Nov-1961 MRN: [id number]   Date of Service  09/12/2020  HPI/Events of Note  Ambien 5 mg not effective to produce sleep.   eICU Interventions  Plan: 1. Benadryl 25 mg PO X 1 now.      Intervention Category Major Interventions: Other:  Lysle Dingwall 09/12/2020, 12:47 AM

## 2020-09-12 NOTE — Progress Notes (Signed)
Patient admitted to Endo.  Dr. Debara Pickett in to see and cancelled procedure secondary to platelets.

## 2020-09-12 NOTE — Progress Notes (Signed)
NAME:  Gregory [name], MRN:  [id number], DOB:  06-15-1961, LOS: 3 ADMISSION DATE:  09/09/2020, CONSULTATION DATE: 09/09/2020 REFERRING MD: EDP, CHIEF COMPLAINT: Near syncopal episode questionable V. tach  Brief History   59 y/o M admitted 9/26 with 3 days of nausea / vomiting and diarrhea.  He has an extensive cardiac history with CAD, combined CHF, ICM s/p implanted automatic cardiac defibrillator for VT. Found on admit to have AKI thought in setting of dehydration/volume depletion, hypotension on vasopressors, lactic acidosis.   Past Medical History  CAD  Chronic Combined Systolic & Diastolic CHF  Ischemic Cardiomyopathy  HTN HLD OSA Erectile Dysfunction   Significant Hospital Events   9/26 Admit to the intensive care unit  Consults:  Cardiology  Procedures:  R Femoral TLC 9/26 >>   Significant Diagnostic Tests:  9/26 Implant interrogation by cardiology 9/27 ECHO >> LVEF 55-60%, normal LV function, no RWMA, normal RV function, LA mildly dilated 9/28 ABD [place] >> cholelithiasis without evidence of acute cholecystitis, fatty liver  Micro Data:  COVID 9/26 >> negative  Influenza A/B 9/26 >> negative  UC 9/26 >> negative  BCx2 9/26 >>   Antimicrobials:  Flagyl 9/26 >> 9/26 Cefepime 9/26 >> 9/27  Interim history/subjective:  Afebrile Glucose range 133-150  O2 2L  I/O 574ml UOP, 735ml+ in last 24 hours  BM 9/28  Objective   Blood pressure 112/79, pulse (!) 106, temperature 98.2 F (36.8 C), temperature source Oral, resp. rate (!) 22, height 6\' 6"  (1.981 m), weight 135.3 kg, SpO2 93 %.        Intake/Output Summary (Last 24 hours) at 09/12/2020 0813 Last data filed at 09/12/2020 0515 Gross per 24 hour  Intake 1303.4 ml  Output 525 ml  Net 778.4 ml   Filed Weights   09/09/20 1520 09/11/20 0500  Weight: 129.3 kg 135.3 kg    Examination: General: adult male lying in bed in NAD HEENT: MM pink/moist, no jvd, fair dentition, growth on left cornea Neuro:  AAOx4, mild drowsiness, speech clear, MAE  CV: s1s2 RRR, no m/r/g PULM: non-labored on  O2, lungs bilaterally clear  GI: soft, bsx4 active  Extremities: warm/dry, no edema  Skin: no rashes or lesions  Resolved Hospital Problem list     Assessment & Plan:   Shock, suspected Hypovolemic - resolved In setting of baseline diuretic use, N/V, diarrhea x3 days. Off vasopressors since 9/29 am. -euvolemia goal  -follow I/O   Chronic Combined CHF -follow I/O's  -per Cardiology   New AF- likely reactive to shock state, now on amio and heparin gtt, appreciate cardiology help ? If related to shock, volume depletion  -planned DCCV 9/29 am  -continue amiodarone, heparin gtt  -appreciate Cardiology  -tele monitoring   AKI  Suspect prerenal in setting of volume depletion -Trend BMP / urinary output -Replace electrolytes as indicated -Avoid nephrotoxic agents, ensure adequate renal perfusion  Hypocalcemia  Corrected Ca+ 7.8 -1 gm calcium gluconate now with arrhythmias -follow trend  Hypokalemia  -monitor, replace as indicated -goal >4+ with AF, planned cardioversion  Mild Acute Liver Injury -follow LFT's  Thrombocytopenia  -monitor CBC -assess HIT screen   N/V, Diarrhea  -none further, monitor  -follow volume status   Insomnia  -PRN Lorrin Mais   Best practice:  Diet: cardiac Pain/Anxiety/Delirium protocol (if indicated): As needed VAP protocol (if indicated): Not indicated DVT prophylaxis: Heparin gtt GI prophylaxis: PPI Glucose control: Sliding scale insulin protocol Mobility: Bedrest Code Status: Full Family Communication: patient updated on  plan of care 9/29 Disposition: ICU, anticipate he will transfer out of unit today and to [facility]30 am pending cardioversion.    Noe Gens, MSN, NP-C [facility] 09/12/2020, 8:13 AM   Please see [email or web address] for pager details.

## 2020-09-12 NOTE — Progress Notes (Signed)
Progress Note  Patient Name: Gregory [name] Date of Encounter: 09/12/2020  [facility] Cardiologist: Sanda Klein, MD   Subjective  Denies any chest pain or dyspnea  Inpatient Medications    Scheduled Meds: . Chlorhexidine Gluconate Cloth  6 each Topical Daily  . insulin aspart  0-20 Units Subcutaneous TID WC  . insulin aspart  0-5 Units Subcutaneous QHS  . levETIRAcetam  500 mg Oral BID  . pantoprazole (PROTONIX) IV  40 mg Intravenous QHS  . sodium chloride flush  10-40 mL Intracatheter Q12H   Continuous Infusions: . amiodarone 30 mg/hr (09/12/20 0800)  . calcium gluconate    . heparin 2,300 Units/hr (09/12/20 0800)  . lactated ringers Stopped (09/12/20 0747)  . norepinephrine (LEVOPHED) Adult infusion Stopped (09/11/20 0421)  . potassium PHOSPHATE IVPB (in mmol)     PRN Meds: docusate sodium, polyethylene glycol, sodium chloride flush, zolpidem   Vital Signs    Vitals:   09/12/20 0300 09/12/20 0400 09/12/20 0500 09/12/20 0600  BP: (!) 112/95  (!) 112/98 (!) 122/91  Pulse: 100 (!) 101 (!) 110 (!) 101  Resp: (!) 22 18 (!) 29 (!) 26  Temp:      TempSrc:      SpO2: 90% 95% (!) 86% 96%  Weight:      Height:        Intake/Output Summary (Last 24 hours) at 09/12/2020 0859 Last data filed at 09/12/2020 0800 Gross per 24 hour  Intake 3155.41 ml  Output 525 ml  Net 2630.41 ml   Last 3 Weights 09/11/2020 09/09/2020 01/06/2020  Weight (lbs) 298 lb 4.5 oz 285 lb 0.9 oz 296 lb  Weight (kg) 135.3 kg 129.3 kg 134.265 kg      Telemetry    AF with rate 100-110s.  PVCs. - Personally Reviewed  ECG    No new ECG - Personally Reviewed  Physical Exam   GEN: No acute distress.   Neck: No JVD Cardiac: irregular, tachycardic, distant heart sounds, no murmurs Respiratory: Clear to auscultation bilaterally. GI: Soft MS: No edema Neuro:  Nonfocal  Psych: Normal affect   Labs    High Sensitivity Troponin:   Recent Labs  Lab 09/09/20 1336   TROPONINIHS 48*      Chemistry Recent Labs  Lab 09/10/20 1407 09/10/20 1407 09/10/20 1645 09/10/20 1645 09/10/20 2246 09/11/20 0420 09/12/20 0337  NA QUESTIONABLE RESULTS, RECOMMEND RECOLLECT TO VERIFY   < > 138   < > 136 136 135  K QUESTIONABLE RESULTS, RECOMMEND RECOLLECT TO VERIFY   < > 2.5*   < > 3.4* 3.6 3.7  CL QUESTIONABLE RESULTS, RECOMMEND RECOLLECT TO VERIFY   < > 110   < > 105 104 103  CO2 QUESTIONABLE RESULTS, RECOMMEND RECOLLECT TO VERIFY   < > 17*   < > 23 21* 21*  GLUCOSE QUESTIONABLE RESULTS, RECOMMEND RECOLLECT TO VERIFY   < > 206*   < > 137* 142* 144*  BUN QUESTIONABLE RESULTS, RECOMMEND RECOLLECT TO VERIFY   < > 35*   < > 34* 32* 20  CREATININE QUESTIONABLE RESULTS, RECOMMEND RECOLLECT TO VERIFY   < > 1.48*   < > 1.64* 1.53* 1.07  CALCIUM QUESTIONABLE RESULTS, RECOMMEND RECOLLECT TO VERIFY   < > <4.0*   < > 5.8* 6.1* 6.4*  PROT QUESTIONABLE RESULTS, RECOMMEND RECOLLECT TO VERIFY  --  3.7*  --   --  5.3*  --   ALBUMIN QUESTIONABLE RESULTS, RECOMMEND RECOLLECT TO VERIFY  --  1.3*  --   --  1.9*  --   AST QUESTIONABLE RESULTS, RECOMMEND RECOLLECT TO VERIFY  --  37  --   --  44*  --   ALT QUESTIONABLE RESULTS, RECOMMEND RECOLLECT TO VERIFY  --  23  --   --  31  --   ALKPHOS QUESTIONABLE RESULTS, RECOMMEND RECOLLECT TO VERIFY  --  35*  --   --  60  --   BILITOT QUESTIONABLE RESULTS, RECOMMEND RECOLLECT TO VERIFY  --  0.5  --   --  0.9  --   GFRNONAA QUESTIONABLE RESULTS, RECOMMEND RECOLLECT TO VERIFY   < > 50*   < > 44* 48* >60  GFRAA QUESTIONABLE RESULTS, RECOMMEND RECOLLECT TO VERIFY   < > 58*   < > 52* 56* >60  ANIONGAP QUESTIONABLE RESULTS, RECOMMEND RECOLLECT TO VERIFY   < > 11   < > 8 11 11    < > = values in this interval not displayed.     Hematology Recent Labs  Lab 09/10/20 0505 09/11/20 0420 09/12/20 0337  WBC 10.6* 7.4 6.8  RBC 4.31 4.27 4.20*  HGB 14.7 14.5 14.0  HCT 42.0 42.0 41.8  MCV 97.4 98.4 99.5  MCH 34.1* 34.0 33.3  MCHC 35.0 34.5 33.5   RDW 15.4 15.8* 16.7*  PLT 78* 69* 53*    BNPNo results for input(s): BNP, PROBNP in the last 168 hours.   DDimer No results for input(s): DDIMER in the last 168 hours.   Radiology    DG Chest 1 View  Result Date: 09/11/2020 CLINICAL DATA:  Short of breath, CHF. EXAM: CHEST  1 VIEW COMPARISON:  09/09/2020 FINDINGS: Cardiac enlargement. AICD unchanged. Negative for heart failure or edema Progression of left lower lobe airspace disease with elevated left hemidiaphragm. Likely atelectasis or pneumonia. No effusion IMPRESSION: Interval development of mild left lower lobe airspace disease consistent with atelectasis or pneumonia. Electronically Signed   By: Franchot Gallo M.D.   On: 09/11/2020 11:52   ECHOCARDIOGRAM COMPLETE  Result Date: 09/10/2020    ECHOCARDIOGRAM REPORT   Patient Name:   Gregory [name] Date of Exam: 09/10/2020 Medical Rec #:  [id number]                 Height:       78.0 in Accession #:    [id number]                Weight:       285.1 lb Date of Birth:  [birth year]                 BSA:          2.624 m Patient Age:    59 years                  BP:           106/74 mmHg Patient Gender: M                         HR:           97 bpm. Exam Location:  Inpatient Procedure: 2D Echo, Cardiac Doppler, Color Doppler and Intracardiac            Opacification Agent Indications:    Cardiomyopathy-Unspecified 425.9 / I42.9  History:        Patient has prior history of Echocardiogram examinations, most  recent 06/08/2018. CHF and Cardiomyopathy, CAD; Risk                 Factors:Hypertension, Dyslipidemia, Sleep Apnea and Former                 Smoker.  Sonographer:    Vickie Epley RDCS Referring Phys: [id number] [place]  Sonographer Comments: Technically difficult study due to poor echo windows and suboptimal subcostal window. IMPRESSIONS  1. Left ventricular ejection fraction, by estimation, is 55 to 60%. The left ventricle has normal function. The left ventricle  has no regional wall motion abnormalities. Left ventricular diastolic function could not be evaluated.  2. Right ventricular systolic function is normal. The right ventricular size is normal.  3. Left atrial size was mildly dilated.  4. The mitral valve is normal in structure. Trivial mitral valve regurgitation. No evidence of mitral stenosis.  5. The aortic valve is normal in structure. Aortic valve regurgitation is not visualized. Mild to moderate aortic valve sclerosis/calcification is present, without any evidence of aortic stenosis.  6. The inferior vena cava is normal in size with greater than 50% respiratory variability, suggesting right atrial pressure of 3 mmHg. FINDINGS  Left Ventricle: Left ventricular ejection fraction, by estimation, is 55 to 60%. The left ventricle has normal function. The left ventricle has no regional wall motion abnormalities. Definity contrast agent was given IV to delineate the left ventricular  endocardial borders. The left ventricular internal cavity size was normal in size. There is no left ventricular hypertrophy. Left ventricular diastolic function could not be evaluated due to atrial fibrillation. Left ventricular diastolic function could  not be evaluated.  LV Wall Scoring: Right Ventricle: The right ventricular size is normal. No increase in right ventricular wall thickness. Right ventricular systolic function is normal. Left Atrium: Left atrial size was mildly dilated. Right Atrium: Right atrial size was normal in size. Pericardium: There is no evidence of pericardial effusion. Mitral Valve: The mitral valve is normal in structure. Trivial mitral valve regurgitation. No evidence of mitral valve stenosis. Tricuspid Valve: The tricuspid valve is normal in structure. Tricuspid valve regurgitation is mild . No evidence of tricuspid stenosis. Aortic Valve: The aortic valve is normal in structure. Aortic valve regurgitation is not visualized. Mild to moderate aortic valve  sclerosis/calcification is present, without any evidence of aortic stenosis. Pulmonic Valve: The pulmonic valve was normal in structure. Pulmonic valve regurgitation is not visualized. No evidence of pulmonic stenosis. Aorta: The aortic root is normal in size and structure. Venous: The inferior vena cava is normal in size with greater than 50% respiratory variability, suggesting right atrial pressure of 3 mmHg. IAS/Shunts: No atrial level shunt detected by color flow Doppler. Additional Comments: A pacer wire is visualized in the right ventricle.  LEFT VENTRICLE PLAX 2D LVOT diam:     2.70 cm LV SV:         71 LV SV Index:   27 LVOT Area:     5.73 cm  LEFT ATRIUM             Index       RIGHT ATRIUM           Index LA Vol (A2C):   80.2 ml 30.56 ml/m RA Area:     19.30 cm LA Vol (A4C):   72.4 ml 27.59 ml/m RA Volume:   54.40 ml  20.73 ml/m LA Biplane Vol: 79.8 ml 30.41 ml/m  AORTIC VALVE LVOT Vmax:   84.90 cm/s  LVOT Vmean:  56.700 cm/s LVOT VTI:    0.124 m  AORTA Ao Root diam: 4.20 cm  SHUNTS Systemic VTI:  0.12 m Systemic Diam: 2.70 cm Ena Dawley MD Electronically signed by Ena Dawley MD Signature Date/Time: 09/10/2020/2:52:13 PM    Final    US Abdomen Limited RUQ  Result Date: 09/11/2020 CLINICAL DATA:  Cirrhosis. EXAM: ULTRASOUND ABDOMEN LIMITED RIGHT UPPER QUADRANT COMPARISON:  None. FINDINGS: Gallbladder: A 1.0 cm shadowing echogenic gallstone is seen within the gallbladder lumen. There is no evidence of gallbladder wall thickening (2.0 mm). No sonographic Murphy sign noted by sonographer. Common bile duct: Diameter: 3.3 mm Liver: No focal lesion identified. There is diffusely increased echogenicity of the liver parenchyma. Portal vein is patent on color Doppler imaging with normal direction of blood flow towards the liver. Other: None. IMPRESSION: 1. Cholelithiasis, without evidence of acute cholecystitis. 2. Fatty liver. Electronically Signed   By: Virgina Norfolk M.D.   On: 09/11/2020  16:19    Cardiac Studies   Echo 06/08/18: - Left ventricle: Systolic function was normal. The estimated  ejection fraction was in the range of 60% to 65%. Wall motion was  normal; there were no regional wall motion abnormalities.  Features are consistent with a pseudonormal left ventricular  filling pattern, with concomitant abnormal relaxation and  increased filling pressure (grade 2 diastolic dysfunction).  - Left atrium: The atrium was severely dilated.   Cath 05/05/16: 1. Ost RCA to Dist RCA lesion, 100% stenosed. Known occluded. RPDA fills via collaterals with minimal filling of PL system 2. Post Atrio lesion, 80% stenosed. 3. Lat 1st Mrg lesion, 100% stenosed. Appears to be jailed from prior stent. Fills via collaterals 4. Widely patent stent in OM1, D1 and mid LAD 5. There is mild to moderate left ventricular systolic dysfunction. 6. Severely elevated LVEDP   Echo 09/10/20: 1. Left ventricular ejection fraction, by estimation, is 55 to 60%. The  left ventricle has normal function. The left ventricle has no regional  wall motion abnormalities. Left ventricular diastolic function could not  be evaluated.  2. Right ventricular systolic function is normal. The right ventricular  size is normal.  3. Left atrial size was mildly dilated.  4. The mitral valve is normal in structure. Trivial mitral valve  regurgitation. No evidence of mitral stenosis.  5. The aortic valve is normal in structure. Aortic valve regurgitation is  not visualized. Mild to moderate aortic valve sclerosis/calcification is  present, without any evidence of aortic stenosis.  6. The inferior vena cava is normal in size with greater than 50%  respiratory variability, suggesting right atrial pressure of 3 mmHg.    Patient Profile     59 y.o. male with a hx of CAD, CHF (EF evaluation as low as 45%, but recently 60-65%) and VT s/p ICD who presents with shock   Assessment & Plan    Shock:  suspect hypovolemic shock, resolved with IV fluids. Echo shows normal biventricular function, no significant valvular disease.  AKI resolved  with IV fluids.    Afib:  New diagnosis.  Previously was on amiodarone for his VT but was stopped due to isolated elevation of alkaline phosphatase, which has resolved.  Restarted on amiodarone. -Continue IV amiodarone, appears rate controlled.  Monitor LFTs -Continue IV heparin for anticoagulation -EP following, planning DCCV today  Thrombocytopenia: screening for HIT  CAD: He has not had any chest pain.  His most recent coronary angiogram was performed in 2017 and showed stable anatomy.  He has known chronic total occlusion of the right coronary artery and the most distal oblique marginal artery branch of the circumflex, and patent stents in the LAD, 1st diagonal and 1st oblique marginal.  Previous nuclear stress testing showed an inferolateral scar, presumably the source of his episodes of sustained ventricular tachycardia.  CHF: Since his initial evaluation in [place] his left ventricular ejection fraction has never been lower than 45% and his last two echoes from 2017 in 2019 actually documented normal left ventricular ejection fraction.  Repeat echo here shows normal EF.  VT: Last episode of true VT seems to have been in January 2021 and was treated with antitachycardia pacing.  Having runs of NSVT, would continue amiodarone as above and maintain K>4, Mag>2  ICD: He underwent generator change in January 2018.  Device function is normal.  He was programmed with a rather low VT detection at 230 bpm due to his previous history of "slow VT".  Since he was in atrial fibrillation with rapid ventricular response on admission, Dr Sallyanne Kuster increased the minimum rate of VT detection 250 bpm to avoid unnecessary therapy for atrial fibrillation with RVR.  EP following.   For questions or updates, please contact [place] Please consult [email or web address] for  contact info under       Signed, Donato Heinz, MD  09/12/2020, 8:59 AM

## 2020-09-13 ENCOUNTER — Inpatient Hospital Stay (HOSPITAL_COMMUNITY): Payer: Worker's Compensation | Admitting: Anesthesiology

## 2020-09-13 ENCOUNTER — Encounter (HOSPITAL_COMMUNITY): Payer: Self-pay | Admitting: Internal Medicine

## 2020-09-13 ENCOUNTER — Encounter (HOSPITAL_COMMUNITY)
Admission: EM | Disposition: A | Discharge status: Rehabilitation Facility | Payer: Self-pay | Source: Home / Self Care | Attending: Internal Medicine

## 2020-09-13 DIAGNOSIS — I4891 Unspecified atrial fibrillation: Secondary | ICD-10-CM

## 2020-09-13 DIAGNOSIS — R579 Shock, unspecified: Secondary | ICD-10-CM

## 2020-09-13 HISTORY — PX: CARDIOVERSION: SHX1299

## 2020-09-13 LAB — GLUCOSE, CAPILLARY
Glucose-Capillary: 164 mg/dL — ABNORMAL HIGH (ref 70–99)
Glucose-Capillary: 124 mg/dL — ABNORMAL HIGH (ref 70–99)
Glucose-Capillary: 137 mg/dL — ABNORMAL HIGH (ref 70–99)
Glucose-Capillary: 144 mg/dL — ABNORMAL HIGH (ref 70–99)

## 2020-09-13 LAB — CBC
HCT: 39.7 % (ref 39.0–52.0)
HCT: 40.7 % (ref 39.0–52.0)
Hemoglobin: 13.5 g/dL (ref 13.0–17.0)
Hemoglobin: 13.6 g/dL (ref 13.0–17.0)
MCH: 34.5 pg — ABNORMAL HIGH (ref 26.0–34.0)
MCH: 34.6 pg — ABNORMAL HIGH (ref 26.0–34.0)
MCHC: 34 g/dL (ref 30.0–36.0)
MCHC: 33.4 g/dL (ref 30.0–36.0)
MCV: 101.5 fL — ABNORMAL HIGH (ref 80.0–100.0)
MCV: 103.6 fL — ABNORMAL HIGH (ref 80.0–100.0)
Platelets: 114 10*3/uL — ABNORMAL LOW (ref 150–400)
Platelets: 102 10*3/uL — ABNORMAL LOW (ref 150–400)
RBC: 3.91 MIL/uL — ABNORMAL LOW (ref 4.22–5.81)
RBC: 3.93 MIL/uL — ABNORMAL LOW (ref 4.22–5.81)
RDW: 17.1 % — ABNORMAL HIGH (ref 11.5–15.5)
RDW: 17.3 % — ABNORMAL HIGH (ref 11.5–15.5)
WBC: 11.2 10*3/uL — ABNORMAL HIGH (ref 4.0–10.5)
WBC: 12.9 10*3/uL — ABNORMAL HIGH (ref 4.0–10.5)
nRBC: 0.7 % — ABNORMAL HIGH (ref 0.0–0.2)
nRBC: 0.5 % — ABNORMAL HIGH (ref 0.0–0.2)

## 2020-09-13 LAB — BASIC METABOLIC PANEL
Anion gap: 10 (ref 5–15)
BUN: 15 mg/dL (ref 8–23)
CO2: 25 mmol/L (ref 22–32)
Calcium: 7.2 mg/dL — ABNORMAL LOW (ref 8.9–10.3)
Chloride: 101 mmol/L (ref 98–111)
Creatinine, Ser: 1.06 mg/dL (ref 0.61–1.24)
GFR calc Af Amer: >60 mL/min (ref >60)
GFR calc non Af Amer: >60 mL/min (ref >60)
Glucose, Bld: 163 mg/dL — ABNORMAL HIGH (ref 70–99)
Potassium: 4.9 mmol/L (ref 3.5–5.1)
Sodium: 136 mmol/L (ref 135–145)

## 2020-09-13 LAB — HEPARIN LEVEL (UNFRACTIONATED)
Heparin Unfractionated: 0.14 IU/mL — ABNORMAL LOW (ref 0.30–0.70)
Heparin Unfractionated: 0.36 IU/mL (ref 0.30–0.70)

## 2020-09-13 LAB — HEPARIN INDUCED PLATELET AB (HIT ANTIBODY): Heparin Induced Plt Ab: 0.155 OD (ref 0.000–0.400)

## 2020-09-13 LAB — PHOSPHORUS: Phosphorus: 2.6 mg/dL (ref 2.5–4.6)

## 2020-09-13 LAB — MAGNESIUM: Magnesium: 2.2 mg/dL (ref 1.7–2.4)

## 2020-09-13 SURGERY — CARDIOVERSION
Anesthesia: General

## 2020-09-13 MED ORDER — SODIUM CHLORIDE 0.9 % IV SOLN: INTRAVENOUS | Status: DC | PRN

## 2020-09-13 MED ORDER — AMIODARONE HCL 200 MG PO TABS
200.0000 mg | ORAL_TABLET | Freq: Two times a day (BID) | ORAL | Status: DC
  Administered 2020-09-13: 200 mg via ORAL
  Filled 2020-09-13: qty 1

## 2020-09-13 MED ORDER — PROPOFOL 10 MG/ML IV BOLUS
INTRAVENOUS | Status: DC | PRN
  Administered 2020-09-13: 70 mg via INTRAVENOUS

## 2020-09-13 MED ORDER — METOPROLOL TARTRATE 50 MG PO TABS
50.0000 mg | ORAL_TABLET | Freq: Two times a day (BID) | ORAL | Status: DC
  Administered 2020-09-13: 50 mg via ORAL
  Filled 2020-09-13: qty 1

## 2020-09-13 MED ORDER — ATORVASTATIN CALCIUM 80 MG PO TABS
80.0000 mg | ORAL_TABLET | Freq: Every day | ORAL | Status: DC
  Administered 2020-09-14 – 2020-09-20 (×6): 80 mg via ORAL
  Filled 2020-09-13 (×4): qty 1
  Filled 2020-09-13: qty 2
  Filled 2020-09-13 (×3): qty 1

## 2020-09-13 NOTE — Progress Notes (Signed)
PROGRESS NOTE    Gregory [name]  GYK:[id number] DOB: 02-Apr-1961 DOA: 09/09/2020 PCP: Patient, No Pcp Per   Brief Narrative:  59 year old with history of CAD, diastolic CHF EF 79%, ischemic cardiomyopathy, HTN, HLD, erectile dysfunction, ischemic cardiomyopathy status post cardiac defibrillator admitted for nausea vomiting diarrhea, found to have AKI and hypotension requiring vasopressors.  Initially admitted to the ICU.  Infectious work-up was negative, echocardiogram showed EF 60%, normal LV, abdominal ultrasound showed cholelithiasis.  Also new onset A. fib likely and cardiology was consulted.  Initially attempted IV amiodarone, placed on IV heparin.  Cardioversion was planned but abandoned due to anticoagulation issues.  While on heparin drip platelets significantly dropped with concerns of sepsis versus HIT.  HIT antibodies were sent.  EP team consulted.  Underwent cardioversion on 9/30 which was unsuccessful.   Assessment & Plan:   Active Problems:   CHF (congestive heart failure) (HCC)   Persistent atrial fibrillation (HCC)   Thrombocytopenia (HCC)   Atrial fibrillation with RVR, new onset -Still having intermittent tachycardia. -On amiodarone drip and heparin drip.  Metoprolol 25 mg p.o. twice daily -EP team consulted.  Cardioversion unsuccessful on 09/13/2020. -TSH 0.7  Hypovolemic shock with severe dehydration, improved Nausea vomiting diarrhea -No evidence of infection.  This was likely secondary to nausea vomiting and diarrhea.  Initially required vasopressors but now discontinued.  The symptoms have resolved for now.  Culture data remains negative. -Closely monitor blood pressure, input and output.  Acute kidney injury -Currently resolved.  Admission creatinine 3.05, this is trended down to 1.06.  Thrombocytopenia -Question of possible HIT.  HIT antibodies labs sent. -Platelets appear to be recovering  Hypocalcemia -Initially 5.1, improved with calcium  gluconate, 7.2.  Severe coronary artery disease status post ICD implant, status post PCI History of ventricular tachycardia status post [employer] ICD -Currently without any chest pain.  Congestive heart failure with preserved ejection fraction, 55% -Initially very dehydrated requiring IV fluids.  Obstructive sleep apnea -CPAP  Mild transaminitis, improved -Abdominal ultrasound negative for acute cholecystitis.  Asymptomatic bacteriuria -No need for antibiotics  History of seizures -On Keppra 5 mg p.o. twice daily    DVT prophylaxis: Heparin drip Code Status: Full code Family Communication:    Status is: Inpatient  Remains inpatient appropriate because:Inpatient level of care appropriate due to severity of illness   Dispo: The patient is from: Home              Anticipated d/c is to: Home              Anticipated d/c date is: 2 days              Patient currently is not medically stable to d/c.  Still remains in atrial fibrillation with RVR with unsuccessful cardioversion.  Maintain hospital stay for now.   Body mass index is 36.1 kg/m.    Subjective: Patient seen after his procedure, he is somewhat drowsy but answers appropriately.  Denies any complaints.  Asking for water.  Heart rate still fluctuating between 100-134.  Review of Systems Otherwise negative except as per HPI, including: General: Denies fever, chills, night sweats or unintended weight loss. Resp: Denies cough, wheezing, shortness of breath. Cardiac: Denies chest pain, palpitations, orthopnea, paroxysmal nocturnal dyspnea. GI: Denies abdominal pain, nausea, vomiting, diarrhea or constipation GU: Denies dysuria, frequency, hesitancy or incontinence MS: Denies muscle aches, joint pain or swelling Neuro: Denies headache, neurologic deficits (focal weakness, numbness, tingling), abnormal gait Psych: Denies anxiety, depression, SI/HI/AVH Skin: Denies  new rashes or lesions ID: Denies sick contacts,  exotic exposures, travel  Examination:  General exam: Appears calm and comfortable, 2 L nasal cannula Respiratory system: Clear to auscultation. Respiratory effort normal. Cardiovascular system: Irregularly irregular rhythm Gastrointestinal system: Abdomen is nondistended, soft and nontender. No organomegaly or masses felt. Normal bowel sounds heard. Central nervous system: Grossly moving all the extremities Extremities: Symmetric 5 x 5 power. Skin: No rashes, lesions or ulcers Psychiatry: Alert awake oriented but drowsy    Objective: Vitals:   09/13/20 0305 09/13/20 0306 09/13/20 0334 09/13/20 0400  BP:   (!) 127/104   Pulse: (!) 101  (!) 118 (!) 102  Resp: 18 (!) 22 (!) 22 (!) 23  Temp:   98.3 F (36.8 C)   TempSrc:   Axillary   SpO2: 91% 94% 100% 94%  Weight:    (!) 141.7 kg  Height:        Intake/Output Summary (Last 24 hours) at 09/13/2020 0750 Last data filed at 09/13/2020 0600 Gross per 24 hour  Intake 3188.68 ml  Output 1350 ml  Net 1838.68 ml   Filed Weights   09/11/20 0500 09/12/20 1409 09/13/20 0400  Weight: 135.3 kg 127 kg (!) 141.7 kg     Data Reviewed:   CBC: Recent Labs  Lab 09/09/20 1336 09/09/20 1758 09/09/20 1810 09/10/20 0505 09/11/20 0420 09/12/20 0337 09/13/20 0323  WBC 10.6*  --  10.9* 10.6* 7.4 6.8 11.2*  NEUTROABS 8.7*  --   --   --   --   --   --   HGB 16.6   < > 15.3 14.7 14.5 14.0 13.5  HCT 48.3   < > 44.4 42.0 42.0 41.8 39.7  MCV 98.8  --  96.3 97.4 98.4 99.5 101.5*  PLT PLATELET CLUMPS NOTED ON SMEAR, UNABLE TO ESTIMATE  --  76* 78* 69* 53* 114*   < > = values in this interval not displayed.   Basic Metabolic Panel: Recent Labs  Lab 09/09/20 1810 09/09/20 1810 09/10/20 0505 09/10/20 1407 09/10/20 1645 09/10/20 2246 09/11/20 0420 09/12/20 0337 09/13/20 0323  NA 132*   < > 131*   < > 138 136 136 135 136  K 3.4*   < > 2.7*   < > 2.5* 3.4* 3.6 3.7 4.9  CL 98   < > 100   < > 110 105 104 103 101  CO2 18*   < > 18*   < >  17* 23 21* 21* 25  GLUCOSE 187*   < > 225*   < > 206* 137* 142* 144* 163*  BUN 54*   < > 50*   < > 35* 34* 32* 20 15  CREATININE 4.48*   < > 3.05*   < > 1.48* 1.64* 1.53* 1.07 1.06  CALCIUM 5.4*   < > 5.1*   < > <4.0* 5.8* 6.1* 6.4* 7.2*  MG 1.4*  --  2.2  --   --   --  2.1 2.1 2.2  PHOS 4.5  --   --   --   --   --  2.1* 2.2* 2.6   < > = values in this interval not displayed.   GFR: Estimated Creatinine Clearance: 115.4 mL/min (by C-G formula based on SCr of 1.06 mg/dL). Liver Function Tests: Recent Labs  Lab 09/09/20 1336 09/09/20 1810 09/10/20 1407 09/10/20 1645 09/11/20 0420  AST 88* 83* QUESTIONABLE RESULTS, RECOMMEND RECOLLECT TO VERIFY 37 44*  ALT 35 34 QUESTIONABLE RESULTS,  RECOMMEND RECOLLECT TO VERIFY 23 31  ALKPHOS 44 41 QUESTIONABLE RESULTS, RECOMMEND RECOLLECT TO VERIFY 35* 60  BILITOT 1.3* 1.2 QUESTIONABLE RESULTS, RECOMMEND RECOLLECT TO VERIFY 0.5 0.9  PROT 4.6* 4.6* QUESTIONABLE RESULTS, RECOMMEND RECOLLECT TO VERIFY 3.7* 5.3*  ALBUMIN 2.0* 2.0* QUESTIONABLE RESULTS, RECOMMEND RECOLLECT TO VERIFY 1.3* 1.9*   Recent Labs  Lab 09/09/20 1810  LIPASE 120*  AMYLASE 98   No results for input(s): AMMONIA in the last 168 hours. Coagulation Profile: Recent Labs  Lab 09/09/20 1810  INR 1.4*   Cardiac Enzymes: No results for input(s): CKTOTAL, CKMB, CKMBINDEX, TROPONINI in the last 168 hours. BNP (last 3 results) No results for input(s): PROBNP in the last 8760 hours. HbA1C: Recent Labs    09/10/20 1645  HGBA1C 6.3*   CBG: Recent Labs  Lab 09/12/20 0741 09/12/20 1156 09/12/20 1634 09/12/20 2133 09/13/20 0616  GLUCAP 150* 103* 132* 134* 164*   Lipid Profile: No results for input(s): CHOL, HDL, LDLCALC, TRIG, CHOLHDL, LDLDIRECT in the last 72 hours. Thyroid Function Tests: Recent Labs    09/10/20 1407  TSH 0.747   Anemia Panel: No results for input(s): VITAMINB12, FOLATE, FERRITIN, TIBC, IRON, RETICCTPCT in the last 72 hours. Sepsis Labs: Recent  Labs  Lab 09/09/20 1336 09/09/20 1640 09/09/20 1810 09/09/20 1942  PROCALCITON  --   --  0.70  --   LATICACIDVEN 3.2* 3.2*  --  2.9*    Recent Results (from the past 240 hour(s))  Urine Culture     Status: None   Collection Time: 09/09/20  4:40 PM   Specimen: Urine, Random  Result Value Ref Range Status   Specimen Description URINE, RANDOM  Final   Special Requests NONE  Final   Culture   Final    NO GROWTH Performed at [facility], [place] [address] [place], [place] [address]    Report Status 09/10/2020 FINAL  Final  Respiratory Panel by RT PCR (Flu A&B, Covid) - Urine, Catheterized     Status: None   Collection Time: 09/09/20  4:40 PM   Specimen: Urine, Catheterized; Nasopharyngeal  Result Value Ref Range Status   SARS Coronavirus 2 by RT PCR NEGATIVE NEGATIVE Final    Comment: (NOTE) SARS-CoV-2 target nucleic acids are NOT DETECTED.  The SARS-CoV-2 RNA is generally detectable in upper respiratoy specimens during the acute phase of infection. The lowest concentration of SARS-CoV-2 viral copies this assay can detect is 131 copies/mL. A negative result does not preclude SARS-Cov-2 infection and should not be used as the sole basis for treatment or other patient management decisions. A negative result may occur with  improper specimen collection/handling, submission of specimen other than nasopharyngeal swab, presence of viral mutation(s) within the areas targeted by this assay, and inadequate number of viral copies (<131 copies/mL). A negative result must be combined with clinical observations, patient history, and epidemiological information. The expected result is Negative.  Fact Sheet for Patients:  PinkCheek.be  Fact Sheet for Healthcare Providers:  GravelBags.it  This test is no t yet approved or cleared by the [place] FDA and  has been authorized for detection and/or diagnosis of SARS-CoV-2  by FDA under an Emergency Use Authorization (EUA). This EUA will remain  in effect (meaning this test can be used) for the duration of the COVID-19 declaration under Section 564(b)(1) of the Act, 21 U.S.C. section 360bbb-3(b)(1), unless the authorization is terminated or revoked sooner.     Influenza A by PCR NEGATIVE NEGATIVE Final  Influenza B by PCR NEGATIVE NEGATIVE Final    Comment: (NOTE) The Xpert Xpress SARS-CoV-2/FLU/RSV assay is intended as an aid in  the diagnosis of influenza from Nasopharyngeal swab specimens and  should not be used as a sole basis for treatment. Nasal washings and  aspirates are unacceptable for Xpert Xpress SARS-CoV-2/FLU/RSV  testing.  Fact Sheet for Patients: PinkCheek.be  Fact Sheet for Healthcare Providers: GravelBags.it  This test is not yet approved or cleared by the [place] FDA and  has been authorized for detection and/or diagnosis of SARS-CoV-2 by  FDA under an Emergency Use Authorization (EUA). This EUA will remain  in effect (meaning this test can be used) for the duration of the  Covid-19 declaration under Section 564(b)(1) of the Act, 21  U.S.C. section 360bbb-3(b)(1), unless the authorization is  terminated or revoked. Performed at [facility], [place] [address] [place], [place] [address]   Culture, blood (routine x 2)     Status: None (Preliminary result)   Collection Time: 09/09/20  7:50 PM   Specimen: BLOOD LEFT ARM  Result Value Ref Range Status   Specimen Description BLOOD LEFT ARM  Final   Special Requests   Final    BOTTLES DRAWN AEROBIC AND ANAEROBIC Blood Culture adequate volume   Culture   Final    NO GROWTH 3 DAYS Performed at [facility], [place] [name], [place], [place] [address]    Report Status PENDING  Incomplete  Culture, blood (routine x 2)     Status: None (Preliminary result)   Collection Time: 09/09/20  7:54 PM   Specimen: BLOOD  RIGHT ARM  Result Value Ref Range Status   Specimen Description BLOOD RIGHT ARM  Final   Special Requests   Final    BOTTLES DRAWN AEROBIC AND ANAEROBIC Blood Culture adequate volume   Culture   Final    NO GROWTH 3 DAYS Performed at [facility], [place] [address] [place], [place] [address]    Report Status PENDING  Incomplete  Urine culture     Status: None   Collection Time: 09/10/20 12:31 AM   Specimen: Urine, Random  Result Value Ref Range Status   Specimen Description URINE, RANDOM  Final   Special Requests NONE  Final   Culture   Final    NO GROWTH Performed at [facility], [place] [address], [place], [place] [address]    Report Status 09/11/2020 FINAL  Final  MRSA PCR Screening     Status: None   Collection Time: 09/10/20  5:05 AM   Specimen: Nasopharyngeal  Result Value Ref Range Status   MRSA by PCR NEGATIVE NEGATIVE Final    Comment:        The GeneXpert MRSA Assay (FDA approved for NASAL specimens only), is one component of a comprehensive MRSA colonization surveillance program. It is not intended to diagnose MRSA infection nor to guide or monitor treatment for MRSA infections. Performed at [facility], [place] [address], [place],  [address]          Radiology Studies: DG Chest 1 View  Result Date: 09/11/2020 CLINICAL DATA:  Short of breath, CHF. EXAM: CHEST  1 VIEW COMPARISON:  09/09/2020 FINDINGS: Cardiac enlargement. AICD unchanged. Negative for heart failure or edema Progression of left lower lobe airspace disease with elevated left hemidiaphragm. Likely atelectasis or pneumonia. No effusion IMPRESSION: Interval development of mild left lower lobe airspace disease consistent with atelectasis or pneumonia.  Electronically Signed   By: Franchot Gallo M.D.   On: 09/11/2020 11:52   US Abdomen Limited RUQ  Result Date: 09/11/2020 CLINICAL DATA:  Cirrhosis. EXAM: ULTRASOUND ABDOMEN LIMITED RIGHT UPPER QUADRANT COMPARISON:  None. FINDINGS:  Gallbladder: A 1.0 cm shadowing echogenic gallstone is seen within the gallbladder lumen. There is no evidence of gallbladder wall thickening (2.0 mm). No sonographic Murphy sign noted by sonographer. Common bile duct: Diameter: 3.3 mm Liver: No focal lesion identified. There is diffusely increased echogenicity of the liver parenchyma. Portal vein is patent on color Doppler imaging with normal direction of blood flow towards the liver. Other: None. IMPRESSION: 1. Cholelithiasis, without evidence of acute cholecystitis. 2. Fatty liver. Electronically Signed   By: Virgina Norfolk M.D.   On: 09/11/2020 16:19        Scheduled Meds: . Chlorhexidine Gluconate Cloth  6 each Topical Daily  . insulin aspart  0-20 Units Subcutaneous TID WC  . insulin aspart  0-5 Units Subcutaneous QHS  . levETIRAcetam  500 mg Oral BID  . metoprolol tartrate  25 mg Oral BID  . pantoprazole (PROTONIX) IV  40 mg Intravenous QHS   Continuous Infusions: . amiodarone 30 mg/hr (09/13/20 0351)  . heparin 2,600 Units/hr (09/13/20 0506)     LOS: 4 days   Time spent= 35 mins    Kerry Chisolm Arsenio Loader, MD [facility]  If 7PM-7AM, please contact night-coverage  09/13/2020, 7:50 AM

## 2020-09-13 NOTE — Progress Notes (Signed)
Progress Note  Patient Name: Gregory [name] Date of Encounter: 09/13/2020  [facility] Cardiologist: Sanda Klein, MD   Subjective  Denies any dyspnea or chest pain  Inpatient Medications    Scheduled Meds:  atorvastatin  80 mg Oral Daily   Chlorhexidine Gluconate Cloth  6 each Topical Daily   insulin aspart  0-20 Units Subcutaneous TID WC   insulin aspart  0-5 Units Subcutaneous QHS   levETIRAcetam  500 mg Oral BID   metoprolol tartrate  25 mg Oral BID   pantoprazole (PROTONIX) IV  40 mg Intravenous QHS   Continuous Infusions:  amiodarone 30 mg/hr (09/13/20 0351)   heparin 2,600 Units/hr (09/13/20 1230)   PRN Meds: acetaminophen, docusate sodium, polyethylene glycol, zolpidem   Vital Signs    Vitals:   09/13/20 1235 09/13/20 1245 09/13/20 1255 09/13/20 1323  BP: 116/80 105/72 127/88 121/78  Pulse: (!) 114 (!) 117 (!) 122 (!) 46  Resp: 18 (!) 21 20 20   Temp:    98.4 F (36.9 C)  TempSrc:    Oral  SpO2: 99% 95% 92% 91%  Weight:      Height:        Intake/Output Summary (Last 24 hours) at 09/13/2020 1404 Last data filed at 09/13/2020 1235 Gross per 24 hour  Intake 1328.65 ml  Output 950 ml  Net 378.65 ml   Last 3 Weights 09/13/2020 09/12/2020 09/11/2020  Weight (lbs) 312 lb 6.3 oz 280 lb 298 lb 4.5 oz  Weight (kg) 141.7 kg 127.007 kg 135.3 kg      Telemetry    AF with rate 100-140s.  PVCs. - Personally Reviewed  ECG    No new ECG - Personally Reviewed  Physical Exam   GEN: No acute distress.   Neck: No JVD Cardiac: irregular, tachycardic, distant heart sounds, no murmurs Respiratory: Clear to auscultation bilaterally. GI: Soft MS: No edema Neuro:  Nonfocal  Psych: Normal affect   Labs    High Sensitivity Troponin:   Recent Labs  Lab 09/09/20 1336  TROPONINIHS 48*      Chemistry Recent Labs  Lab 09/10/20 1407 09/10/20 1407 09/10/20 1645 09/10/20 2246 09/11/20 0420 09/12/20 0337 09/13/20 0323  NA  QUESTIONABLE RESULTS, RECOMMEND RECOLLECT TO VERIFY   < > 138   < > 136 135 136  K QUESTIONABLE RESULTS, RECOMMEND RECOLLECT TO VERIFY   < > 2.5*   < > 3.6 3.7 4.9  CL QUESTIONABLE RESULTS, RECOMMEND RECOLLECT TO VERIFY   < > 110   < > 104 103 101  CO2 QUESTIONABLE RESULTS, RECOMMEND RECOLLECT TO VERIFY   < > 17*   < > 21* 21* 25  GLUCOSE QUESTIONABLE RESULTS, RECOMMEND RECOLLECT TO VERIFY   < > 206*   < > 142* 144* 163*  BUN QUESTIONABLE RESULTS, RECOMMEND RECOLLECT TO VERIFY   < > 35*   < > 32* 20 15  CREATININE QUESTIONABLE RESULTS, RECOMMEND RECOLLECT TO VERIFY   < > 1.48*   < > 1.53* 1.07 1.06  CALCIUM QUESTIONABLE RESULTS, RECOMMEND RECOLLECT TO VERIFY   < > <4.0*   < > 6.1* 6.4* 7.2*  PROT QUESTIONABLE RESULTS, RECOMMEND RECOLLECT TO VERIFY  --  3.7*  --  5.3*  --   --   ALBUMIN QUESTIONABLE RESULTS, RECOMMEND RECOLLECT TO VERIFY  --  1.3*  --  1.9*  --   --   AST QUESTIONABLE RESULTS, RECOMMEND RECOLLECT TO VERIFY  --  37  --  44*  --   --  ALT QUESTIONABLE RESULTS, RECOMMEND RECOLLECT TO VERIFY  --  23  --  31  --   --   ALKPHOS QUESTIONABLE RESULTS, RECOMMEND RECOLLECT TO VERIFY  --  35*  --  60  --   --   BILITOT QUESTIONABLE RESULTS, RECOMMEND RECOLLECT TO VERIFY  --  0.5  --  0.9  --   --   GFRNONAA QUESTIONABLE RESULTS, RECOMMEND RECOLLECT TO VERIFY   < > 50*   < > 48* >60 >60  GFRAA QUESTIONABLE RESULTS, RECOMMEND RECOLLECT TO VERIFY   < > 58*   < > 56* >60 >60  ANIONGAP QUESTIONABLE RESULTS, RECOMMEND RECOLLECT TO VERIFY   < > 11   < > 11 11 10    < > = values in this interval not displayed.     Hematology Recent Labs  Lab 09/12/20 0337 09/13/20 0323 09/13/20 1024  WBC 6.8 11.2* 12.9*  RBC 4.20* 3.91* 3.93*  HGB 14.0 13.5 13.6  HCT 41.8 39.7 40.7  MCV 99.5 101.5* 103.6*  MCH 33.3 34.5* 34.6*  MCHC 33.5 34.0 33.4  RDW 16.7* 17.1* 17.3*  PLT 53* 114* 102*    BNPNo results for input(s): BNP, PROBNP in the last 168 hours.   DDimer No results for input(s): DDIMER in  the last 168 hours.   Radiology    US Abdomen Limited RUQ  Result Date: 09/11/2020 CLINICAL DATA:  Cirrhosis. EXAM: ULTRASOUND ABDOMEN LIMITED RIGHT UPPER QUADRANT COMPARISON:  None. FINDINGS: Gallbladder: A 1.0 cm shadowing echogenic gallstone is seen within the gallbladder lumen. There is no evidence of gallbladder wall thickening (2.0 mm). No sonographic Murphy sign noted by sonographer. Common bile duct: Diameter: 3.3 mm Liver: No focal lesion identified. There is diffusely increased echogenicity of the liver parenchyma. Portal vein is patent on color Doppler imaging with normal direction of blood flow towards the liver. Other: None. IMPRESSION: 1. Cholelithiasis, without evidence of acute cholecystitis. 2. Fatty liver. Electronically Signed   By: Virgina Norfolk M.D.   On: 09/11/2020 16:19    Cardiac Studies   Echo 06/08/18: - Left ventricle: Systolic function was normal. The estimated  ejection fraction was in the range of 60% to 65%. Wall motion was  normal; there were no regional wall motion abnormalities.  Features are consistent with a pseudonormal left ventricular  filling pattern, with concomitant abnormal relaxation and  increased filling pressure (grade 2 diastolic dysfunction).  - Left atrium: The atrium was severely dilated.   Cath 05/05/16: 1. Ost RCA to Dist RCA lesion, 100% stenosed. Known occluded. RPDA fills via collaterals with minimal filling of PL system 2. Post Atrio lesion, 80% stenosed. 3. Lat 1st Mrg lesion, 100% stenosed. Appears to be jailed from prior stent. Fills via collaterals 4. Widely patent stent in OM1, D1 and mid LAD 5. There is mild to moderate left ventricular systolic dysfunction. 6. Severely elevated LVEDP   Echo 09/10/20: 1. Left ventricular ejection fraction, by estimation, is 55 to 60%. The  left ventricle has normal function. The left ventricle has no regional  wall motion abnormalities. Left ventricular diastolic function could  not  be evaluated.  2. Right ventricular systolic function is normal. The right ventricular  size is normal.  3. Left atrial size was mildly dilated.  4. The mitral valve is normal in structure. Trivial mitral valve  regurgitation. No evidence of mitral stenosis.  5. The aortic valve is normal in structure. Aortic valve regurgitation is  not visualized. Mild to moderate aortic valve sclerosis/calcification  is  present, without any evidence of aortic stenosis.  6. The inferior vena cava is normal in size with greater than 50%  respiratory variability, suggesting right atrial pressure of 3 mmHg.    Patient Profile     59 y.o. male with a hx of CAD, CHF (EF evaluation as low as 45%, but recently 60-65%) and VT s/p ICD who presents with shock   Assessment & Plan    Shock: suspect hypovolemic shock, resolved with IV fluids. Echo shows normal biventricular function, no significant valvular disease.  AKI resolved  with IV fluids.  Cultures negative.  Afib:  New diagnosis.  Previously was on amiodarone for his VT but was stopped due to isolated elevation of alkaline phosphatase, which has resolved.  Restarted on amiodarone.  Rates have been elevated.  Failed cardioversion on 9/30. -Continue IV heparin for anticoagulation -Increase metoprolol to 50 mg BID -EP following, appreciate recs -DCCV attempted today.  Was unsuccessful.  Could consider amio load and repeat DCCV attempt, will discuss with EP.  Thrombocytopenia: screening for HIT, however platelet count has recovered despite continuing heparin gtt  CAD: He has not had any chest pain.  His most recent coronary angiogram was performed in 2017 and showed stable anatomy.  He has known chronic total occlusion of the right coronary artery and the most distal oblique marginal artery branch of the circumflex, and patent stents in the LAD, 1st diagonal and 1st oblique marginal.  Previous nuclear stress testing showed an inferolateral scar,  presumably the source of his episodes of sustained ventricular tachycardia.  CHF: Since his initial evaluation in [place] his left ventricular ejection fraction has never been lower than 45% and his last two echoes from 2017 in 2019 actually documented normal left ventricular ejection fraction.  Repeat echo here shows normal EF.  VT: Last episode of true VT seems to have been in January 2021 and was treated with antitachycardia pacing.  Having runs of NSVT, would continue amiodarone as above and maintain K>4, Mag>2  ICD: He underwent generator change in January 2018.  Device function is normal.  He was programmed with a rather low VT detection at 230 bpm due to his previous history of "slow VT".  Since he was in atrial fibrillation with rapid ventricular response on admission, Dr Sallyanne Kuster increased the minimum rate of VT detection 250 bpm to avoid unnecessary therapy for atrial fibrillation with RVR.  EP following.   For questions or updates, please contact [place] Please consult [email or web address] for contact info under       Signed, Donato Heinz, MD  09/13/2020, 2:04 PM

## 2020-09-13 NOTE — Progress Notes (Signed)
Pt sleeps very poorly here, otherwise denies complaints, on O2, declied CPAP again last night, mask here is too uncomfortable  Telemetry remains AFib, occ-at times frequent PVCs, 110's-120's HIT antibodies lab pending CBC today notes plts up to 114, H/H stable Renal function remains improved Ca++ also improving stable  Discussed with Dr. Caryl Comes, will plan for DCCV today Discussed with Dr. Gardiner Rhyme as well, in agreement. Cards will round today.  Discussed with patient, he remains agreeable to DCCV He had some sips of water this morning, nothing to eat, he was instructed nothing further, RN made aware  Tommye Standard, PA-C

## 2020-09-13 NOTE — Anesthesia Preprocedure Evaluation (Addendum)
Anesthesia Evaluation  Patient identified by MRN, date of birth, ID band Patient awake    Reviewed: Patient's Chart, lab work & pertinent test results  Airway Mallampati: III  TM Distance: >3 FB Neck ROM: Full    Dental  (+) Teeth Intact   Pulmonary sleep apnea , former smoker,    Pulmonary exam normal  + decreased breath sounds      Cardiovascular hypertension, Pt. on medications + CAD and +CHF  + Cardiac Defibrillator  Rhythm:Irregular Rate:Tachycardia     Neuro/Psych    GI/Hepatic GERD  ,(+) Cirrhosis       ,   Endo/Other  diabetes  Renal/GU negative Renal ROS  negative genitourinary   Musculoskeletal negative musculoskeletal ROS (+)   Abdominal (+) + obese,  Abdomen: soft. Bowel sounds: normal.  Peds  Hematology   Anesthesia Other Findings   Reproductive/Obstetrics                            Anesthesia Physical Anesthesia Plan  ASA: III  Anesthesia Plan: General   Post-op Pain Management:    Induction:   PONV Risk Score and Plan:   Airway Management Planned: Mask  Additional Equipment: None  Intra-op Plan:   Post-operative Plan:   Informed Consent: I have reviewed the patients History and Physical, chart, labs and discussed the procedure including the risks, benefits and alternatives for the proposed anesthesia with the patient or authorized representative who has indicated his/her understanding and acceptance.     Dental advisory given  Plan Discussed with: CRNA and Surgeon  Anesthesia Plan Comments: (ECHO 09/21:   1. Left ventricular ejection fraction, by estimation, is 55 to 60%. The left ventricle has normal function. The left ventricle has no regional wall motion abnormalities. Left ventricular diastolic function could not be evaluated. 2. Right ventricular systolic function is normal. The right ventricular size is normal. 3. Left atrial size was  mildly dilated. 4. The mitral valve is normal in structure. Trivial mitral valve regurgitation. No evidence of mitral stenosis. 5. The aortic valve is normal in structure. Aortic valve regurgitation is not visualized. Mild to moderate aortic valve sclerosis/calcification is present, without any evidence of aortic stenosis. 6. The inferior vena cava is normal in size with greater than 50% respiratory variability, suggesting right atrial pressure of 3 mmHg.)        Anesthesia Quick Evaluation

## 2020-09-13 NOTE — Transfer of Care (Signed)
Immediate Anesthesia Transfer of Care Note  Patient: Gregory [name]  Procedure(s) Performed: CARDIOVERSION (N/A )  Patient Location: Endoscopy Unit  Anesthesia Type:General  Level of Consciousness: drowsy  Airway & Oxygen Therapy: Patient Spontanous Breathing  Post-op Assessment: Report given to RN, Post -op Vital signs reviewed and stable and Patient moving all extremities X 4  Post vital signs: Reviewed and stable  Last Vitals:  Vitals Value Taken Time  BP    Temp    Pulse    Resp    SpO2      Last Pain:  Vitals:   09/13/20 0918  TempSrc: Oral  PainSc: 0-No pain         Complications: No complications documented.

## 2020-09-13 NOTE — CV Procedure (Signed)
   Electrical Cardioversion Procedure Note Gregory [name] [id number] [birth year]  Procedure: Electrical Cardioversion Indications:  Atrial Fibrillation  Time Out: Verified patient identification, verified procedure,medications/allergies/relevent history reviewed, required imaging and test results available.  Performed  Procedure Details  The patient was NPO after midnight. Anesthesia was administered at the beside  by Dr.Gerhardt with 70mg  of propofol.  Cardioversion was done with synchronized biphasic defibrillation with AP pads with 200 J.  Two attempts were made.  The patient did not convert to sinus rhythm; this was confirmed with this patients device. The patient tolerated the procedure well   IMPRESSION:  Unsuccessful cardioversion of atrial fibrillation    Gregory [name] A Gregory [name] 09/13/2020, 12:33 PM

## 2020-09-13 NOTE — Progress Notes (Signed)
ANTICOAGULATION CONSULT NOTE - Follow Up Consult  Pharmacy Consult for heparin Indication: atrial fibrillation  Labs: Recent Labs    09/11/20 0420 09/11/20 0420 09/11/20 1208 09/12/20 0337 09/13/20 0323  HGB 14.5   < >  --  14.0 13.5  HCT 42.0  --   --  41.8 39.7  PLT 69*  --   --  53* 114*  HEPARINUNFRC 0.41   < > 0.34 0.33 0.14*  CREATININE 1.53*  --   --  1.07 1.06   < > = values in this interval not displayed.    Assessment: 59yo male subtherapeutic on heparin after several levels at goal though had been at low end of goal and trending down; no gtt issues per RN though she does note bruising, no worse than start of shift.  Of note Plt up to 114.  Goal of Therapy:  Heparin level 0.3-0.7 units/ml   Plan:  Will increase heparin gtt by 2-3 units/kgABW/hr to 2600 units/hr and check level in 6 hours.    Wynona Neat, PharmD, BCPS  09/13/2020,5:09 AM

## 2020-09-13 NOTE — Interval H&P Note (Signed)
History and Physical Interval Note:  Platelets have improved to 113; on heparin.  Discussed with Drs. Maretta Bees and Caryl Comes; will proceed with DCCV.  09/13/2020 10:00 AM  Gregory [name]  has presented today for surgery, with the diagnosis of atrial fib.  The various methods of treatment have been discussed with the patient and family. After consideration of risks, benefits and other options for treatment, the patient has consented to  Procedure(s): CARDIOVERSION (N/A) as a surgical intervention.  The patient's history has been reviewed, patient examined, no change in status, stable for surgery.  I have reviewed the patient's chart and labs.  Questions were answered to the patient's satisfaction.     [name] A Samhita Kretsch

## 2020-09-13 NOTE — Progress Notes (Signed)
ANTICOAGULATION CONSULT NOTE  Pharmacy Consult for Heparin Indication: atrial fibrillation  No Known Allergies  Patient Measurements: Height: 6\' 6"  (198.1 cm) Weight: (!) 141.7 kg (312 lb 6.3 oz) IBW/kg (Calculated) : 91.4 Heparin Dosing Weight: 118kg  Vital Signs: Temp: 97.7 F (36.5 C) (09/30 1930) Temp Source: Oral (09/30 1930) BP: 102/58 (09/30 1930) Pulse Rate: 68 (09/30 1930)  Labs: Recent Labs    09/11/20 0420 09/11/20 1208 09/12/20 0337 09/12/20 0337 09/13/20 0323 09/13/20 1024 09/13/20 2001  HGB 14.5  --  14.0   < > 13.5 13.6  --   HCT 42.0  --  41.8  --  39.7 40.7  --   PLT 69*  --  53*  --  114* 102*  --   HEPARINUNFRC 0.41   < > 0.33  --  0.14*  --  0.36  CREATININE 1.53*  --  1.07  --  1.06  --   --    < > = values in this interval not displayed.    Estimated Creatinine Clearance: 115.4 mL/min (by C-G formula based on SCr of 1.06 mg/dL).   Medical History: Past Medical History:  Diagnosis Date  . CAD (coronary artery disease) 12/01/2013  . Chronic combined systolic and diastolic CHF, NYHA class 1 ([place]) 12/01/2013  . Erectile dysfunction 12/01/2013  . HTN (hypertension) 12/01/2013  . Hyperlipidemia 12/01/2013  . Ischemic cardiomyopathy 12/01/2013  . Sleep apnea      Assessment: 69 yoM admitted with AFib RVR started on IV heparin. CBC stable, pltc low since admit. HIT screen negative, failed DCCV today. Heparin level this afternoon is therapeutic at 0.36, pltc improving.  Goal of Therapy:  Heparin level 0.3-0.7 units/ml Monitor platelets by anticoagulation protocol: Yes   Plan:  Cont heparin 2600 units/hr Daily heparin level and CBC   Arrie Senate, PharmD, BCPS Clinical Pharmacist [phone] Please check AMION for all [facility] numbers 09/13/2020

## 2020-09-14 ENCOUNTER — Inpatient Hospital Stay (HOSPITAL_COMMUNITY): Payer: Worker's Compensation

## 2020-09-14 LAB — GLUCOSE, CAPILLARY
Glucose-Capillary: 132 mg/dL — ABNORMAL HIGH (ref 70–99)
Glucose-Capillary: 126 mg/dL — ABNORMAL HIGH (ref 70–99)
Glucose-Capillary: 126 mg/dL — ABNORMAL HIGH (ref 70–99)
Glucose-Capillary: 129 mg/dL — ABNORMAL HIGH (ref 70–99)
Glucose-Capillary: 174 mg/dL — ABNORMAL HIGH (ref 70–99)

## 2020-09-14 LAB — CBC WITH DIFFERENTIAL/PLATELET
Abs Immature Granulocytes: 0 10*3/uL (ref 0.00–0.07)
Basophils Absolute: 0 10*3/uL (ref 0.0–0.1)
Basophils Relative: 0 %
Eosinophils Absolute: 0 10*3/uL (ref 0.0–0.5)
Eosinophils Relative: 0 %
HCT: 40.9 % (ref 39.0–52.0)
Hemoglobin: 13.4 g/dL (ref 13.0–17.0)
Lymphocytes Relative: 5 %
Lymphs Abs: 0.9 10*3/uL (ref 0.7–4.0)
MCH: 33.8 pg (ref 26.0–34.0)
MCHC: 32.8 g/dL (ref 30.0–36.0)
MCV: 103 fL — ABNORMAL HIGH (ref 80.0–100.0)
Monocytes Absolute: 0.3 10*3/uL (ref 0.1–1.0)
Monocytes Relative: 2 %
Neutro Abs: 16.1 10*3/uL — ABNORMAL HIGH (ref 1.7–7.7)
Neutrophils Relative %: 93 %
Platelets: 122 10*3/uL — ABNORMAL LOW (ref 150–400)
RBC: 3.97 MIL/uL — ABNORMAL LOW (ref 4.22–5.81)
RDW: 17.8 % — ABNORMAL HIGH (ref 11.5–15.5)
WBC: 17.3 10*3/uL — ABNORMAL HIGH (ref 4.0–10.5)
nRBC: 0.4 % — ABNORMAL HIGH (ref 0.0–0.2)
nRBC: 1 /100 WBC — ABNORMAL HIGH

## 2020-09-14 LAB — CULTURE, BLOOD (ROUTINE X 2)
Culture: NO GROWTH
Culture: NO GROWTH
Special Requests: ADEQUATE
Special Requests: ADEQUATE

## 2020-09-14 LAB — CBC
HCT: 41.2 % (ref 39.0–52.0)
Hemoglobin: 13.3 g/dL (ref 13.0–17.0)
MCH: 33.3 pg (ref 26.0–34.0)
MCHC: 32.3 g/dL (ref 30.0–36.0)
MCV: 103 fL — ABNORMAL HIGH (ref 80.0–100.0)
Platelets: 128 10*3/uL — ABNORMAL LOW (ref 150–400)
RBC: 4 MIL/uL — ABNORMAL LOW (ref 4.22–5.81)
RDW: 17.6 % — ABNORMAL HIGH (ref 11.5–15.5)
WBC: 16.8 10*3/uL — ABNORMAL HIGH (ref 4.0–10.5)
nRBC: 0.4 % — ABNORMAL HIGH (ref 0.0–0.2)

## 2020-09-14 LAB — BASIC METABOLIC PANEL
Anion gap: 7 (ref 5–15)
BUN: 17 mg/dL (ref 8–23)
CO2: 27 mmol/L (ref 22–32)
Calcium: 7.9 mg/dL — ABNORMAL LOW (ref 8.9–10.3)
Chloride: 101 mmol/L (ref 98–111)
Creatinine, Ser: 1.11 mg/dL (ref 0.61–1.24)
GFR calc Af Amer: >60 mL/min (ref >60)
GFR calc non Af Amer: >60 mL/min (ref >60)
Glucose, Bld: 138 mg/dL — ABNORMAL HIGH (ref 70–99)
Potassium: 4.1 mmol/L (ref 3.5–5.1)
Sodium: 135 mmol/L (ref 135–145)

## 2020-09-14 LAB — PHOSPHORUS: Phosphorus: 2.2 mg/dL — ABNORMAL LOW (ref 2.5–4.6)

## 2020-09-14 LAB — HEPARIN LEVEL (UNFRACTIONATED): Heparin Unfractionated: 0.46 IU/mL (ref 0.30–0.70)

## 2020-09-14 LAB — MAGNESIUM: Magnesium: 2.3 mg/dL (ref 1.7–2.4)

## 2020-09-14 MED ORDER — AMIODARONE HCL 200 MG PO TABS
400.0000 mg | ORAL_TABLET | Freq: Two times a day (BID) | ORAL | Status: DC
  Administered 2020-09-14 – 2020-09-22 (×15): 400 mg via ORAL
  Filled 2020-09-14 (×16): qty 2

## 2020-09-14 MED ORDER — METOPROLOL TARTRATE 25 MG PO TABS
25.0000 mg | ORAL_TABLET | Freq: Two times a day (BID) | ORAL | Status: DC
  Administered 2020-09-14 – 2020-09-21 (×14): 25 mg via ORAL
  Filled 2020-09-14 (×15): qty 1

## 2020-09-14 MED ORDER — DILTIAZEM HCL 60 MG PO TABS
30.0000 mg | ORAL_TABLET | Freq: Three times a day (TID) | ORAL | Status: DC
  Administered 2020-09-14 – 2020-09-15 (×4): 30 mg via ORAL
  Filled 2020-09-14 (×4): qty 1

## 2020-09-14 MED ORDER — APIXABAN 5 MG PO TABS
5.0000 mg | ORAL_TABLET | Freq: Two times a day (BID) | ORAL | Status: DC
  Administered 2020-09-14 – 2020-09-15 (×4): 5 mg via ORAL
  Filled 2020-09-14 (×5): qty 1

## 2020-09-14 MED ORDER — TRAZODONE HCL 50 MG PO TABS
50.0000 mg | ORAL_TABLET | Freq: Every evening | ORAL | Status: DC | PRN
  Administered 2020-09-15: 50 mg via ORAL
  Filled 2020-09-14: qty 1

## 2020-09-14 MED ORDER — PANTOPRAZOLE SODIUM 40 MG PO TBEC
40.0000 mg | DELAYED_RELEASE_TABLET | Freq: Every day | ORAL | Status: DC
  Administered 2020-09-14 – 2020-09-23 (×9): 40 mg via ORAL
  Filled 2020-09-14 (×9): qty 1

## 2020-09-14 NOTE — Progress Notes (Signed)
PROGRESS NOTE    Gregory [name]  KKX:[id number] DOB: March 07, 1961 DOA: 09/09/2020 PCP: Patient, No Pcp Per   Brief Narrative:  59 year old with history of CAD, diastolic CHF EF 16%, ischemic cardiomyopathy, HTN, HLD, erectile dysfunction, ischemic cardiomyopathy status post cardiac defibrillator admitted for nausea vomiting diarrhea, found to have AKI and hypotension requiring vasopressors.  Initially admitted to the ICU.  Infectious work-up was negative, echocardiogram showed EF 60%, normal LV, abdominal ultrasound showed cholelithiasis.  Also new onset A. fib likely and cardiology was consulted.  Initially attempted IV amiodarone, placed on IV heparin.  Cardioversion was planned but abandoned due to anticoagulation issues.  While on heparin drip platelets significantly dropped with concerns of sepsis versus HIT.  HIT antibodies were sent.  EP team consulted.  Underwent cardioversion on 9/30 which was unsuccessful.   Assessment & Plan:   Principal Problem:   Atrial fibrillation with rapid ventricular response (HCC) Active Problems:   Chronic combined systolic and diastolic heart failure (HCC)   CAD S/P percutaneous coronary angioplasty   Essential hypertension   Ventricular tachycardia (HCC)   Seizure disorder (HCC)   CHF (congestive heart failure) (HCC)   Persistent atrial fibrillation (HCC)   Thrombocytopenia (HCC)   Shock (HCC)   Atrial fibrillation with RVR, new onset -Still having intermittent tachycardia. -Rate control/antiarrhythmic management per cardiology team.  Plans to load with amiodarone and then reattempt cardioversion -Continue metoprolol, and Cardizem -EP team consulted.  Cardioversion unsuccessful on 09/13/2020. -TSH 0.7  Hypovolemic shock with severe dehydration, resolved Nausea vomiting diarrhea -No evidence of infection.  This was likely secondary to nausea vomiting and diarrhea.  Initially required vasopressors but now discontinued.  The symptoms have  resolved for now.  Culture data remains negative. -Closely monitor blood pressure, input and output.  Acute kidney injury -Currently resolved.  Admission creatinine 3.05, this is trended down to 1.1  Thrombocytopenia -Question of possible HIT.  HIT antibodies labs sent. -Platelets slowly recovering  Hypocalcemia -Initially 5.1, improved with calcium gluconate, 7.9  Severe coronary artery disease status post ICD implant, status post PCI History of ventricular tachycardia status post [employer] ICD -Currently without any chest pain.  Macrocytic anemia -TSH normal -Check B12 and folate  Congestive heart failure with preserved ejection fraction, 55% -Initially very dehydrated requiring IV fluids.  Obstructive sleep apnea -CPAP  Mild transaminitis, improved -Abdominal ultrasound negative for acute cholecystitis.  Asymptomatic bacteriuria -No need for antibiotics  History of seizures -On Keppra 5 mg p.o. twice daily    DVT prophylaxis: Eliquis Code Status: Full code Family Communication:    Status is: Inpatient  Remains inpatient appropriate because:Inpatient level of care appropriate due to severity of illness   Dispo: The patient is from: Home              Anticipated d/c is to: Home              Anticipated d/c date is: 2 days              Patient currently is not medically stable to d/c.  Still remains in atrial fibrillation with RVR with unsuccessful cardioversion.  Maintain hospital stay for now.   Body mass index is 36.33 kg/m.    Subjective: Patient received Ambien last night and this morning still low disoriented.  He is okay with trying trazodone.  Heart rate still remains elevated and irregular.   Examination: Constitutional: Not in acute distress, slightly drowsy but easily arousable Respiratory: Clear to auscultation bilaterally Cardiovascular: Tachycardiac-irregularly irregular  Abdomen: Nontender nondistended good bowel sounds Musculoskeletal:  No edema noted Skin: No rashes seen Neurologic: CN 2-12 grossly intact.  And nonfocal Psychiatric: Normal judgment and insight. Alert and oriented x 3. Normal mood.   Objective: Vitals:   09/14/20 0650 09/14/20 0807 09/14/20 0853 09/14/20 1108  BP:  104/77 119/75 126/82  Pulse: (!) 127 (!) 117 (!) 123 (!) 109  Resp: (!) 22 17  20   Temp:  97.6 F (36.4 C)  97.9 F (36.6 C)  TempSrc:  Oral  Axillary  SpO2: 100% 98%  99%  Weight:      Height:        Intake/Output Summary (Last 24 hours) at 09/14/2020 1243 Last data filed at 09/14/2020 1109 Gross per 24 hour  Intake 1291.99 ml  Output 900 ml  Net 391.99 ml   Filed Weights   09/12/20 1409 09/13/20 0400 09/14/20 0422  Weight: 127 kg (!) 141.7 kg (!) 142.6 kg     Data Reviewed:   CBC: Recent Labs  Lab 09/09/20 1336 09/09/20 1758 09/12/20 0337 09/13/20 0323 09/13/20 1024 09/14/20 0426 09/14/20 0858  WBC 10.6*   < > 6.8 11.2* 12.9* 16.8* 17.3*  NEUTROABS 8.7*  --   --   --   --   --  16.1*  HGB 16.6   < > 14.0 13.5 13.6 13.3 13.4  HCT 48.3   < > 41.8 39.7 40.7 41.2 40.9  MCV 98.8   < > 99.5 101.5* 103.6* 103.0* 103.0*  PLT PLATELET CLUMPS NOTED ON SMEAR, UNABLE TO ESTIMATE   < > 53* 114* 102* 128* 122*   < > = values in this interval not displayed.   Basic Metabolic Panel: Recent Labs  Lab 09/09/20 1810 09/09/20 1810 09/10/20 0505 09/10/20 1407 09/10/20 2246 09/11/20 0420 09/12/20 0337 09/13/20 0323 09/14/20 0426  NA 132*   < > 131*   < > 136 136 135 136 135  K 3.4*   < > 2.7*   < > 3.4* 3.6 3.7 4.9 4.1  CL 98   < > 100   < > 105 104 103 101 101  CO2 18*   < > 18*   < > 23 21* 21* 25 27  GLUCOSE 187*   < > 225*   < > 137* 142* 144* 163* 138*  BUN 54*   < > 50*   < > 34* 32* 20 15 17   CREATININE 4.48*   < > 3.05*   < > 1.64* 1.53* 1.07 1.06 1.11  CALCIUM 5.4*   < > 5.1*   < > 5.8* 6.1* 6.4* 7.2* 7.9*  MG 1.4*   < > 2.2  --   --  2.1 2.1 2.2 2.3  PHOS 4.5  --   --   --   --  2.1* 2.2* 2.6 2.2*   < > =  values in this interval not displayed.   GFR: Estimated Creatinine Clearance: 110.6 mL/min (by C-G formula based on SCr of 1.11 mg/dL). Liver Function Tests: Recent Labs  Lab 09/09/20 1336 09/09/20 1810 09/10/20 1407 09/10/20 1645 09/11/20 0420  AST 88* 83* QUESTIONABLE RESULTS, RECOMMEND RECOLLECT TO VERIFY 37 44*  ALT 35 34 QUESTIONABLE RESULTS, RECOMMEND RECOLLECT TO VERIFY 23 31  ALKPHOS 44 41 QUESTIONABLE RESULTS, RECOMMEND RECOLLECT TO VERIFY 35* 60  BILITOT 1.3* 1.2 QUESTIONABLE RESULTS, RECOMMEND RECOLLECT TO VERIFY 0.5 0.9  PROT 4.6* 4.6* QUESTIONABLE RESULTS, RECOMMEND RECOLLECT TO VERIFY 3.7* 5.3*  ALBUMIN 2.0* 2.0* QUESTIONABLE RESULTS, RECOMMEND  RECOLLECT TO VERIFY 1.3* 1.9*   Recent Labs  Lab 09/09/20 1810  LIPASE 120*  AMYLASE 98   No results for input(s): AMMONIA in the last 168 hours. Coagulation Profile: Recent Labs  Lab 09/09/20 1810  INR 1.4*   Cardiac Enzymes: No results for input(s): CKTOTAL, CKMB, CKMBINDEX, TROPONINI in the last 168 hours. BNP (last 3 results) No results for input(s): PROBNP in the last 8760 hours. HbA1C: No results for input(s): HGBA1C in the last 72 hours. CBG: Recent Labs  Lab 09/13/20 1631 09/13/20 2058 09/14/20 0216 09/14/20 0612 09/14/20 1130  GLUCAP 137* 144* 132* 126* 126*   Lipid Profile: No results for input(s): CHOL, HDL, LDLCALC, TRIG, CHOLHDL, LDLDIRECT in the last 72 hours. Thyroid Function Tests: No results for input(s): TSH, T4TOTAL, FREET4, T3FREE, THYROIDAB in the last 72 hours. Anemia Panel: No results for input(s): VITAMINB12, FOLATE, FERRITIN, TIBC, IRON, RETICCTPCT in the last 72 hours. Sepsis Labs: Recent Labs  Lab 09/09/20 1336 09/09/20 1640 09/09/20 1810 09/09/20 1942  PROCALCITON  --   --  0.70  --   LATICACIDVEN 3.2* 3.2*  --  2.9*    Recent Results (from the past 240 hour(s))  Urine Culture     Status: None   Collection Time: 09/09/20  4:40 PM   Specimen: Urine, Random  Result  Value Ref Range Status   Specimen Description URINE, RANDOM  Final   Special Requests NONE  Final   Culture   Final    NO GROWTH Performed at [facility], [place] [address] [place], [place] [address]    Report Status 09/10/2020 FINAL  Final  Respiratory Panel by RT PCR (Flu A&B, Covid) - Urine, Catheterized     Status: None   Collection Time: 09/09/20  4:40 PM   Specimen: Urine, Catheterized; Nasopharyngeal  Result Value Ref Range Status   SARS Coronavirus 2 by RT PCR NEGATIVE NEGATIVE Final    Comment: (NOTE) SARS-CoV-2 target nucleic acids are NOT DETECTED.  The SARS-CoV-2 RNA is generally detectable in upper respiratoy specimens during the acute phase of infection. The lowest concentration of SARS-CoV-2 viral copies this assay can detect is 131 copies/mL. A negative result does not preclude SARS-Cov-2 infection and should not be used as the sole basis for treatment or other patient management decisions. A negative result may occur with  improper specimen collection/handling, submission of specimen other than nasopharyngeal swab, presence of viral mutation(s) within the areas targeted by this assay, and inadequate number of viral copies (<131 copies/mL). A negative result must be combined with clinical observations, patient history, and epidemiological information. The expected result is Negative.  Fact Sheet for Patients:  PinkCheek.be  Fact Sheet for Healthcare Providers:  GravelBags.it  This test is no t yet approved or cleared by the [place] FDA and  has been authorized for detection and/or diagnosis of SARS-CoV-2 by FDA under an Emergency Use Authorization (EUA). This EUA will remain  in effect (meaning this test can be used) for the duration of the COVID-19 declaration under Section 564(b)(1) of the Act, 21 U.S.C. section 360bbb-3(b)(1), unless the authorization is terminated or revoked sooner.      Influenza A by PCR NEGATIVE NEGATIVE Final   Influenza B by PCR NEGATIVE NEGATIVE Final    Comment: (NOTE) The Xpert Xpress SARS-CoV-2/FLU/RSV assay is intended as an aid in  the diagnosis of influenza from Nasopharyngeal swab specimens and  should not be used as a sole basis for treatment. Nasal washings and  aspirates are unacceptable for Xpert Xpress SARS-CoV-2/FLU/RSV  testing.  Fact Sheet for Patients: PinkCheek.be  Fact Sheet for Healthcare Providers: GravelBags.it  This test is not yet approved or cleared by the [place] FDA and  has been authorized for detection and/or diagnosis of SARS-CoV-2 by  FDA under an Emergency Use Authorization (EUA). This EUA will remain  in effect (meaning this test can be used) for the duration of the  Covid-19 declaration under Section 564(b)(1) of the Act, 21  U.S.C. section 360bbb-3(b)(1), unless the authorization is  terminated or revoked. Performed at [facility], [place] [address] [place], [place] [address]   Culture, blood (routine x 2)     Status: None   Collection Time: 09/09/20  7:50 PM   Specimen: BLOOD LEFT ARM  Result Value Ref Range Status   Specimen Description BLOOD LEFT ARM  Final   Special Requests   Final    BOTTLES DRAWN AEROBIC AND ANAEROBIC Blood Culture adequate volume   Culture   Final    NO GROWTH 5 DAYS Performed at [facility], [place] [address], [place], [place] [address]    Report Status 09/14/2020 FINAL  Final  Culture, blood (routine x 2)     Status: None   Collection Time: 09/09/20  7:54 PM   Specimen: BLOOD RIGHT ARM  Result Value Ref Range Status   Specimen Description BLOOD RIGHT ARM  Final   Special Requests   Final    BOTTLES DRAWN AEROBIC AND ANAEROBIC Blood Culture adequate volume   Culture   Final    NO GROWTH 5 DAYS Performed at [facility], [place] [address] [place], [place] [address]    Report Status 09/14/2020  FINAL  Final  Urine culture     Status: None   Collection Time: 09/10/20 12:31 AM   Specimen: Urine, Random  Result Value Ref Range Status   Specimen Description URINE, RANDOM  Final   Special Requests NONE  Final   Culture   Final    NO GROWTH Performed at [facility], [place] [address] [place], [place] [address]    Report Status 09/11/2020 FINAL  Final  MRSA PCR Screening     Status: None   Collection Time: 09/10/20  5:05 AM   Specimen: Nasopharyngeal  Result Value Ref Range Status   MRSA by PCR NEGATIVE NEGATIVE Final    Comment:        The GeneXpert MRSA Assay (FDA approved for NASAL specimens only), is one component of a comprehensive MRSA colonization surveillance program. It is not intended to diagnose MRSA infection nor to guide or monitor treatment for MRSA infections. Performed at [facility], [place] [address], [place], [place] [address]          Radiology Studies: DG Chest 2 View  Result Date: 09/14/2020 CLINICAL DATA:  Short of breath.  Progressive pleuritic pain EXAM: CHEST - 2 VIEW COMPARISON:  09/11/2020 FINDINGS: Progressive left lower lobe density due to airspace disease and effusion. Right lung remains clear. Cardiac enlargement without heart failure. AICD again noted. IMPRESSION: Progressive left lower lobe airspace disease and effusion. Possible pneumonia or fluid overload. Electronically Signed   By: Franchot Gallo M.D.   On: 09/14/2020 08:49        Scheduled Meds: . amiodarone  400 mg Oral BID  . apixaban  5 mg Oral BID  . atorvastatin  80 mg Oral Daily  . Chlorhexidine Gluconate Cloth  6 each Topical Daily  . diltiazem  30 mg Oral Q8H  . insulin aspart  0-20 Units Subcutaneous TID WC  . insulin aspart  0-5 Units Subcutaneous QHS  . levETIRAcetam  500 mg Oral BID  . metoprolol tartrate  25 mg Oral BID  . pantoprazole  40 mg Oral QHS   Continuous Infusions:    LOS: 5 days   Time spent= 35 mins    Airyn Ellzey Arsenio Loader,  MD [facility]  If 7PM-7AM, please contact night-coverage  09/14/2020, 12:43 PM

## 2020-09-14 NOTE — Discharge Instructions (Signed)

## 2020-09-14 NOTE — Progress Notes (Signed)
Noted referral for cost of Eliquis - pt has no insurance and would need to pay total cost of the drug unless he applies for pt assistance. He could use 30 day free card for the first month at discharge.

## 2020-09-14 NOTE — Anesthesia Postprocedure Evaluation (Signed)
Anesthesia Post Note  Patient: Gregory [name]  Procedure(s) Performed: CARDIOVERSION (N/A )     Patient location during evaluation: Endoscopy Anesthesia Type: General Level of consciousness: sedated and patient cooperative Pain management: pain level controlled Vital Signs Assessment: post-procedure vital signs reviewed and stable Respiratory status: spontaneous breathing Cardiovascular status: stable Anesthetic complications: no   No complications documented.  Last Vitals:  Vitals:   09/14/20 0545 09/14/20 0650  BP:    Pulse: 88 (!) 127  Resp: (!) 21 (!) 22  Temp:    SpO2: 96% 100%    Last Pain:  Vitals:   09/14/20 0650  TempSrc:   PainSc: 0-No pain                 Nolon Nations

## 2020-09-14 NOTE — Progress Notes (Signed)
@  0150 Pt found to have removed CPAP. 3L [place] replaced. Pt somewhat more oriented, but still "foggy" stating it was time to get up. Pt reoriented and assessed. Pt diaphoretic and cool. CBG obtained and pt not hypoglycemic. Pt endorsed he develops sweats at night on a regular basis and that this is not unusual. Will continue to closely monitor. Bed alarm maintained for pt safety.

## 2020-09-14 NOTE — Progress Notes (Signed)
@  approx. 0025, this RN entered room to find pt chewing on pulse-ox probe with exposed wires in his sleep. Pt awakened and while initially confused to place and situation, was easily reoriented. Pulse-ox replaced and pt re-educated on necessity of leaving it on. Pt endorsed understanding but seemed somewhat "fogged". Will continue to monitor closely and convey to Day Team the possible correlation with pt's Ambien administration. Side rails up x3 and bed alarm set.  Update: @approx . 0040 pt found to have removed pulse-ox in his sleep again. Will leave off and spot check to facilitate better rest for pt in setting of confusion.

## 2020-09-14 NOTE — Progress Notes (Signed)
Mobility Specialist - Progress Note   09/14/20 1309  Mobility  Activity Contraindicated/medical hold   RN instructed not to see pt today.  Pricilla Handler Mobility Specialist Mobility Specialist Phone: [phone]

## 2020-09-14 NOTE — Progress Notes (Signed)
Progress Note  Patient Name: Gregory [name] Date of Encounter: 09/14/2020  [facility] Cardiologist: Sanda Klein, MD   Subjective  Denies any dyspnea, chest pain, lightheadedness, or palpitations  Inpatient Medications    Scheduled Meds: . amiodarone  400 mg Oral BID  . apixaban  5 mg Oral BID  . atorvastatin  80 mg Oral Daily  . Chlorhexidine Gluconate Cloth  6 each Topical Daily  . diltiazem  30 mg Oral Q8H  . insulin aspart  0-20 Units Subcutaneous TID WC  . insulin aspart  0-5 Units Subcutaneous QHS  . levETIRAcetam  500 mg Oral BID  . metoprolol tartrate  25 mg Oral BID  . pantoprazole  40 mg Oral QHS   Continuous Infusions:  PRN Meds: acetaminophen, docusate sodium, polyethylene glycol, zolpidem   Vital Signs    Vitals:   09/14/20 0545 09/14/20 0650 09/14/20 0807 09/14/20 0853  BP:   104/77 119/75  Pulse: 88 (!) 127 (!) 117 (!) 123  Resp: (!) 21 (!) 22 17   Temp:   97.6 F (36.4 C)   TempSrc:   Oral   SpO2: 96% 100% 98%   Weight:      Height:        Intake/Output Summary (Last 24 hours) at 09/14/2020 1103 Last data filed at 09/14/2020 0600 Gross per 24 hour  Intake 1391.99 ml  Output 300 ml  Net 1091.99 ml   Last 3 Weights 09/14/2020 09/13/2020 09/12/2020  Weight (lbs) 314 lb 6 oz 312 lb 6.3 oz 280 lb  Weight (kg) 142.6 kg 141.7 kg 127.007 kg      Telemetry    AF with rate 110-140s.  PVCs. - Personally Reviewed  ECG    No new ECG - Personally Reviewed  Physical Exam   GEN: No acute distress.  Somnolent but arousable Neck: No JVD Cardiac: irregular, tachycardic, distant heart sounds, no murmurs Respiratory: Clear to auscultation bilaterally. GI: Soft MS: No edema Neuro:  Nonfocal  Psych: Normal affect   Labs    High Sensitivity Troponin:   Recent Labs  Lab 09/09/20 1336  TROPONINIHS 48*      Chemistry Recent Labs  Lab 09/10/20 1407 09/10/20 1407 09/10/20 1645 09/10/20 2246 09/11/20 0420 09/11/20 0420  09/12/20 0337 09/13/20 0323 09/14/20 0426  NA QUESTIONABLE RESULTS, RECOMMEND RECOLLECT TO VERIFY   < > 138   < > 136   < > 135 136 135  K QUESTIONABLE RESULTS, RECOMMEND RECOLLECT TO VERIFY   < > 2.5*   < > 3.6   < > 3.7 4.9 4.1  CL QUESTIONABLE RESULTS, RECOMMEND RECOLLECT TO VERIFY   < > 110   < > 104   < > 103 101 101  CO2 QUESTIONABLE RESULTS, RECOMMEND RECOLLECT TO VERIFY   < > 17*   < > 21*   < > 21* 25 27  GLUCOSE QUESTIONABLE RESULTS, RECOMMEND RECOLLECT TO VERIFY   < > 206*   < > 142*   < > 144* 163* 138*  BUN QUESTIONABLE RESULTS, RECOMMEND RECOLLECT TO VERIFY   < > 35*   < > 32*   < > 20 15 17   CREATININE QUESTIONABLE RESULTS, RECOMMEND RECOLLECT TO VERIFY   < > 1.48*   < > 1.53*   < > 1.07 1.06 1.11  CALCIUM QUESTIONABLE RESULTS, RECOMMEND RECOLLECT TO VERIFY   < > <4.0*   < > 6.1*   < > 6.4* 7.2* 7.9*  PROT QUESTIONABLE RESULTS, RECOMMEND RECOLLECT  TO VERIFY  --  3.7*  --  5.3*  --   --   --   --   ALBUMIN QUESTIONABLE RESULTS, RECOMMEND RECOLLECT TO VERIFY  --  1.3*  --  1.9*  --   --   --   --   AST QUESTIONABLE RESULTS, RECOMMEND RECOLLECT TO VERIFY  --  37  --  44*  --   --   --   --   ALT QUESTIONABLE RESULTS, RECOMMEND RECOLLECT TO VERIFY  --  23  --  31  --   --   --   --   ALKPHOS QUESTIONABLE RESULTS, RECOMMEND RECOLLECT TO VERIFY  --  35*  --  60  --   --   --   --   BILITOT QUESTIONABLE RESULTS, RECOMMEND RECOLLECT TO VERIFY  --  0.5  --  0.9  --   --   --   --   GFRNONAA QUESTIONABLE RESULTS, RECOMMEND RECOLLECT TO VERIFY   < > 50*   < > 48*   < > >60 >60 >60  GFRAA QUESTIONABLE RESULTS, RECOMMEND RECOLLECT TO VERIFY   < > 58*   < > 56*   < > >60 >60 >60  ANIONGAP QUESTIONABLE RESULTS, RECOMMEND RECOLLECT TO VERIFY   < > 11   < > 11   < > 11 10 7    < > = values in this interval not displayed.     Hematology Recent Labs  Lab 09/13/20 1024 09/14/20 0426 09/14/20 0858  WBC 12.9* 16.8* 17.3*  RBC 3.93* 4.00* 3.97*  HGB 13.6 13.3 13.4  HCT 40.7 41.2 40.9  MCV  103.6* 103.0* 103.0*  MCH 34.6* 33.3 33.8  MCHC 33.4 32.3 32.8  RDW 17.3* 17.6* 17.8*  PLT 102* 128* 122*    BNPNo results for input(s): BNP, PROBNP in the last 168 hours.   DDimer No results for input(s): DDIMER in the last 168 hours.   Radiology    DG Chest 2 View  Result Date: 09/14/2020 CLINICAL DATA:  Short of breath.  Progressive pleuritic pain EXAM: CHEST - 2 VIEW COMPARISON:  09/11/2020 FINDINGS: Progressive left lower lobe density due to airspace disease and effusion. Right lung remains clear. Cardiac enlargement without heart failure. AICD again noted. IMPRESSION: Progressive left lower lobe airspace disease and effusion. Possible pneumonia or fluid overload. Electronically Signed   By: Franchot Gallo M.D.   On: 09/14/2020 08:49    Cardiac Studies   Echo 06/08/18: - Left ventricle: Systolic function was normal. The estimated  ejection fraction was in the range of 60% to 65%. Wall motion was  normal; there were no regional wall motion abnormalities.  Features are consistent with a pseudonormal left ventricular  filling pattern, with concomitant abnormal relaxation and  increased filling pressure (grade 2 diastolic dysfunction).  - Left atrium: The atrium was severely dilated.   Cath 05/05/16: 1. Ost RCA to Dist RCA lesion, 100% stenosed. Known occluded. RPDA fills via collaterals with minimal filling of PL system 2. Post Atrio lesion, 80% stenosed. 3. Lat 1st Mrg lesion, 100% stenosed. Appears to be jailed from prior stent. Fills via collaterals 4. Widely patent stent in OM1, D1 and mid LAD 5. There is mild to moderate left ventricular systolic dysfunction. 6. Severely elevated LVEDP   Echo 09/10/20: 1. Left ventricular ejection fraction, by estimation, is 55 to 60%. The  left ventricle has normal function. The left ventricle has no regional  wall motion abnormalities. Left  ventricular diastolic function could not  be evaluated.  2. Right ventricular  systolic function is normal. The right ventricular  size is normal.  3. Left atrial size was mildly dilated.  4. The mitral valve is normal in structure. Trivial mitral valve  regurgitation. No evidence of mitral stenosis.  5. The aortic valve is normal in structure. Aortic valve regurgitation is  not visualized. Mild to moderate aortic valve sclerosis/calcification is  present, without any evidence of aortic stenosis.  6. The inferior vena cava is normal in size with greater than 50%  respiratory variability, suggesting right atrial pressure of 3 mmHg.    Patient Profile     59 y.o. male with a hx of CAD, CHF (EF evaluation as low as 45%, but recently 60-65%) and VT s/p ICD who presents with shock   Assessment & Plan    Shock: suspect hypovolemic shock, resolved with IV fluids. Echo shows normal biventricular function, no significant valvular disease.  AKI resolved  with IV fluids.  Cultures negative.  Afib:  New diagnosis.  Previously was on amiodarone for his VT but was stopped due to isolated elevation of alkaline phosphatase, which has resolved.  Restarted on amiodarone.  Rates have been elevated.  Failed cardioversion on 9/30. -Discontinue heparin, start Eliquis 5 mg BID -EP following, appreciate recs: loading with oral amio 400 mg BID, can reattempt cardioversion after amio load.  Rate control changed to diltiazem 30 mg TID and metoprolol 25 mg BID  Thrombocytopenia: screening for HIT, however platelet count has recovered despite continuing heparin gtt  CAD: He has not had any chest pain.  His most recent coronary angiogram was performed in 2017 and showed stable anatomy.  He has known chronic total occlusion of the right coronary artery and the most distal oblique marginal artery branch of the circumflex, and patent stents in the LAD, 1st diagonal and 1st oblique marginal.  Previous nuclear stress testing showed an inferolateral scar, presumably the source of his episodes of  sustained ventricular tachycardia.  CHF: Since his initial evaluation in [place] his left ventricular ejection fraction has never been lower than 45% and his last two echoes from 2017 in 2019 actually documented normal left ventricular ejection fraction.  Repeat echo here shows normal EF.  VT: Last episode of true VT seems to have been in January 2021 and was treated with antitachycardia pacing.  Having runs of NSVT, would continue amiodarone as above and maintain K>4, Mag>2  ICD: He underwent generator change in January 2018.  Device function is normal.  He was programmed with a rather low VT detection at 230 bpm due to his previous history of "slow VT".  Since he was in atrial fibrillation with rapid ventricular response on admission, Dr Sallyanne Kuster increased the minimum rate of VT detection 250 bpm to avoid unnecessary therapy for atrial fibrillation with RVR.  EP following.   For questions or updates, please contact [place] Please consult [email or web address] for contact info under       Signed, Donato Heinz, MD  09/14/2020, 11:03 AM

## 2020-09-14 NOTE — Progress Notes (Signed)
Progress Note  Patient Name: Gregory [name] Date of Encounter: 09/14/2020  [facility] Cardiologist: Sanda , MD   Subjective   No CP, no palpitations, feels much better then time of admission, denies SOB  Inpatient Medications    Scheduled Meds:  amiodarone  400 mg Oral BID   atorvastatin  80 mg Oral Daily   Chlorhexidine Gluconate Cloth  6 each Topical Daily   diltiazem  30 mg Oral Q8H   insulin aspart  0-20 Units Subcutaneous TID WC   insulin aspart  0-5 Units Subcutaneous QHS   levETIRAcetam  500 mg Oral BID   metoprolol tartrate  25 mg Oral BID   pantoprazole  40 mg Oral QHS   Continuous Infusions:  heparin 2,600 Units/hr (09/14/20 0600)   PRN Meds: acetaminophen, docusate sodium, polyethylene glycol, zolpidem   Vital Signs    Vitals:   09/14/20 0500 09/14/20 0545 09/14/20 0650 09/14/20 0807  BP: (!) 127/95   104/77  Pulse: (!) 115 88 (!) 127 (!) 117  Resp: 18 (!) 21 (!) 22 17  Temp: 98.6 F (37 C)   97.6 F (36.4 C)  TempSrc: Axillary   Oral  SpO2: 96% 96% 100% 98%  Weight:      Height:        Intake/Output Summary (Last 24 hours) at 09/14/2020 0813 Last data filed at 09/14/2020 0600 Gross per 24 hour  Intake 1391.99 ml  Output 300 ml  Net 1091.99 ml   Last 3 Weights 09/14/2020 09/13/2020 09/12/2020  Weight (lbs) 314 lb 6 oz 312 lb 6.3 oz 280 lb  Weight (kg) 142.6 kg 141.7 kg 127.007 kg      Telemetry    AFib 90's-110's, occasionally frequent PVCs, one NSVT 8beats - Personally Reviewed  ECG    Coarse AFib/flutter, 100bpm, PVC - Personally Reviewed  Physical Exam   GEN: No acute distress.   Neck: No JVD Cardiac: irreg-irreg, no murmurs, rubs, or gallops.  Respiratory: Clear to auscultation bilaterally. GI: Soft, nontender MS: No edema; No deformity. Neuro:  Nonfocal  Psych: Normal affect   Labs    High Sensitivity Troponin:   Recent Labs  Lab 09/09/20 1336  TROPONINIHS 48*      Chemistry Recent  Labs  Lab 09/10/20 1407 09/10/20 1407 09/10/20 1645 09/10/20 2246 09/11/20 0420 09/11/20 0420 09/12/20 0337 09/13/20 0323 09/14/20 0426  NA QUESTIONABLE RESULTS, RECOMMEND RECOLLECT TO VERIFY   < > 138   < > 136   < > 135 136 135  K QUESTIONABLE RESULTS, RECOMMEND RECOLLECT TO VERIFY   < > 2.5*   < > 3.6   < > 3.7 4.9 4.1  CL QUESTIONABLE RESULTS, RECOMMEND RECOLLECT TO VERIFY   < > 110   < > 104   < > 103 101 101  CO2 QUESTIONABLE RESULTS, RECOMMEND RECOLLECT TO VERIFY   < > 17*   < > 21*   < > 21* 25 27  GLUCOSE QUESTIONABLE RESULTS, RECOMMEND RECOLLECT TO VERIFY   < > 206*   < > 142*   < > 144* 163* 138*  BUN QUESTIONABLE RESULTS, RECOMMEND RECOLLECT TO VERIFY   < > 35*   < > 32*   < > 20 15 17   CREATININE QUESTIONABLE RESULTS, RECOMMEND RECOLLECT TO VERIFY   < > 1.48*   < > 1.53*   < > 1.07 1.06 1.11  CALCIUM QUESTIONABLE RESULTS, RECOMMEND RECOLLECT TO VERIFY   < > <4.0*   < > 6.1*   < >  6.4* 7.2* 7.9*  PROT QUESTIONABLE RESULTS, RECOMMEND RECOLLECT TO VERIFY  --  3.7*  --  5.3*  --   --   --   --   ALBUMIN QUESTIONABLE RESULTS, RECOMMEND RECOLLECT TO VERIFY  --  1.3*  --  1.9*  --   --   --   --   AST QUESTIONABLE RESULTS, RECOMMEND RECOLLECT TO VERIFY  --  37  --  44*  --   --   --   --   ALT QUESTIONABLE RESULTS, RECOMMEND RECOLLECT TO VERIFY  --  23  --  31  --   --   --   --   ALKPHOS QUESTIONABLE RESULTS, RECOMMEND RECOLLECT TO VERIFY  --  35*  --  60  --   --   --   --   BILITOT QUESTIONABLE RESULTS, RECOMMEND RECOLLECT TO VERIFY  --  0.5  --  0.9  --   --   --   --   GFRNONAA QUESTIONABLE RESULTS, RECOMMEND RECOLLECT TO VERIFY   < > 50*   < > 48*   < > >60 >60 >60  GFRAA QUESTIONABLE RESULTS, RECOMMEND RECOLLECT TO VERIFY   < > 58*   < > 56*   < > >60 >60 >60  ANIONGAP QUESTIONABLE RESULTS, RECOMMEND RECOLLECT TO VERIFY   < > 11   < > 11   < > 11 10 7    < > = values in this interval not displayed.     Hematology Recent Labs  Lab 09/13/20 0323 09/13/20 1024  09/14/20 0426  WBC 11.2* 12.9* 16.8*  RBC 3.91* 3.93* 4.00*  HGB 13.5 13.6 13.3  HCT 39.7 40.7 41.2  MCV 101.5* 103.6* 103.0*  MCH 34.5* 34.6* 33.3  MCHC 34.0 33.4 32.3  RDW 17.1* 17.3* 17.6*  PLT 114* 102* 128*    BNPNo results for input(s): BNP, PROBNP in the last 168 hours.   DDimer No results for input(s): DDIMER in the last 168 hours.   Radiology    DG Chest 1 View Result Date: 09/11/2020 CLINICAL DATA:  Short of breath, CHF. EXAM: CHEST  1 VIEW COMPARISON:  09/09/2020 FINDINGS: Cardiac enlargement. AICD unchanged. Negative for heart failure or edema Progression of left lower lobe airspace disease with elevated left hemidiaphragm. Likely atelectasis or pneumonia. No effusion IMPRESSION: Interval development of mild left lower lobe airspace disease consistent with atelectasis or pneumonia. Electronically Signed   By: Franchot Gallo M.D.   On: 09/11/2020 11:52     US Abdomen Limited RUQ Result Date: 09/11/2020 CLINICAL DATA:  Cirrhosis. EXAM: ULTRASOUND ABDOMEN LIMITED RIGHT UPPER QUADRANT COMPARISON:  None. FINDINGS: Gallbladder: A 1.0 cm shadowing echogenic gallstone is seen within the gallbladder lumen. There is no evidence of gallbladder wall thickening (2.0 mm). No sonographic Murphy sign noted by sonographer. Common bile duct: Diameter: 3.3 mm Liver: No focal lesion identified. There is diffusely increased echogenicity of the liver parenchyma. Portal vein is patent on color Doppler imaging with normal direction of blood flow towards the liver. Other: None. IMPRESSION: 1. Cholelithiasis, without evidence of acute cholecystitis. 2. Fatty liver. Electronically Signed   By: Virgina Norfolk M.D.   On: 09/11/2020 16:19    Cardiac Studies   09/10/2020: TTE IMPRESSIONS  1. Left ventricular ejection fraction, by estimation, is 55 to 60%. The  left ventricle has normal function. The left ventricle has no regional  wall motion abnormalities. Left ventricular diastolic function could  not  be evaluated.  2. Right ventricular systolic function is normal. The right ventricular  size is normal.  3. Left atrial size was mildly dilated.  4. The mitral valve is normal in structure. Trivial mitral valve  regurgitation. No evidence of mitral stenosis.  5. The aortic valve is normal in structure. Aortic valve regurgitation is  not visualized. Mild to moderate aortic valve sclerosis/calcification is  present, without any evidence of aortic stenosis.  6. The inferior vena cava is normal in size with greater than 50%  respiratory variability, suggesting right atrial pressure of 3 mmHg.  06/08/2018: TTE Study Conclusions - Left ventricle: Systolic function was normal. The estimated  ejection fraction was in the range of 60% to 65%. Wall motion was  normal; there were no regional wall motion abnormalities.  Features are consistent with a pseudonormal left ventricular  filling pattern, with concomitant abnormal relaxation and  increased filling pressure (grade 2 diastolic dysfunction).  - Left atrium: The atrium was severely dilated.    Cardiac cath 05/05/2016  1. Ost RCA to Dist RCA lesion, 100% stenosed. Known occluded. RPDA fills via collaterals with minimal filling of PL system 2. Post Atrio lesion, 80% stenosed. 3. Lat 1st Mrg lesion, 100% stenosed. Appears to be jailed from prior stent. Fills via collaterals 4. Widely patent stent in OM1, D1 and mid LAD 5. There is mild to moderate left ventricular systolic dysfunction. 6. Severely elevated LVEDP  No new potential culprit lesion visualized to explain the patient's presentation with ventricular tachycardiac. There is a but appears to be chronically occluded branch of an OM as well as known occlusion of the RCA.  He has known moderately reduced EF with now severely elevated LVEDP.  Plan:  Return to TCU for continued care.  Consider diuresis for elevated LVEDP. This is in the setting of normal to  low systemic pressures.  Continue to titrate antiarrhythmic medications.    Patient Profile     59 y.o. male w/PMHx of CAD (knonw chronic occl of RCA with prior PCIs to LAD, diag, and LCx), VT, ICM,  OSA w/CPAP, seizures, chronic CHF, HLD, HTN, obesity  admitted to [facility] 9/26 with progressive weakness, orthostatic dizziness, and a syncopal event, He was markedly hypotensive, started on IVF with agressive replacement as well a levophed, felt to be in shock (unceratin etiology septic vs hypovolemic) also noted to be in new AFib  Assessment & Plan     1. New Afib in setting of shock     heparin gtt started     amio for rate      CHA2DS2Vasc is 3     Will need transition to [facility] prior to d/c  Remains in Afib 90's-110's Dr. Caryl Comes in review of device interrogation, time of AFib onset and start of heparin gtt does not think TEE is required given anticoagulated within 48hours of onset of his Af DCCV scheduled this morning Will d/w Dr. Caryl Comes +/- amio going forward, suspect we will not pursue this   2. Shock     off pressor yesterday AM     BC x2 are neg 3 days     afebrile     Some diarrhea reported at home though no significant drop off in oral inteke per the pt     remains on LR     cumulatively fluid + 10,947L     electrolyte derangement  C/w CCM    3. AKI     much better  4. Hx of VT  Has MDT ICD     Intermittently with increased frequency of PVCs, one NSVT 8beats     Preserved BiVe function   5. CAD     Felt stable     Cards following   6. CHF     Appears a little SOB, though he denies (appears similar to yesterday AM)     Declined CPAP yesterday but wore O2     CXR yesterday mid-day without mention of edema or effusion     Lungs sound quite clear, no edema     O2 sats ok      Will defer to CCM and gen cards of need for any diuresis        For questions or updates, please contact [facility] Please consult [email or web address] for contact info under         Signed, Virl Axe, MD  09/14/2020, 8:13 AM   Afib RVR refractory to cardioversion  HFpEF  Hypoalbuminemia  Shock / mechanism  Leukocytosis, new  Hypocalcemia, thrombocytopenia renal function improving  Sleep apnea    Will check CXR-with WBC- check diff  Not sure what is going on and still donot have an explanation for profound hypoalbuminemia, but has + protein inurine, so have ordered 24 hr urine Will plan repeat DCCV next week with double defibs  Add low dose dilt to amio and metop for HR control, and would stop > 12 hrs prior to DCCV  ? Sleep apnea,  Nocturnal desaturation

## 2020-09-14 NOTE — Progress Notes (Addendum)
[place] for Heparin>> apixaban Indication: atrial fibrillation  No Known Allergies  Patient Measurements: Height: 6\' 6"  (198.1 cm) Weight: (!) 142.6 kg (314 lb 6 oz) IBW/kg (Calculated) : 91.4 Heparin Dosing Weight: 118kg  Vital Signs: Temp: 98.6 F (37 C) (10/01 0500) Temp Source: Axillary (10/01 0500) BP: 127/95 (10/01 0500) Pulse Rate: 127 (10/01 0650)  Labs: Recent Labs    09/12/20 0337 09/12/20 0337 09/13/20 0323 09/13/20 0323 09/13/20 1024 09/13/20 2001 09/14/20 0426  HGB 14.0   < > 13.5   < > 13.6  --  13.3  HCT 41.8   < > 39.7  --  40.7  --  41.2  PLT 53*   < > 114*  --  102*  --  128*  HEPARINUNFRC 0.33   < > 0.14*  --   --  0.36 0.46  CREATININE 1.07  --  1.06  --   --   --  1.11   < > = values in this interval not displayed.    Estimated Creatinine Clearance: 110.6 mL/min (by C-G formula based on SCr of 1.11 mg/dL).   Medical History: Past Medical History:  Diagnosis Date  . CAD (coronary artery disease) 12/01/2013  . Chronic combined systolic and diastolic CHF, NYHA class 1 ([place]) 12/01/2013  . Erectile dysfunction 12/01/2013  . HTN (hypertension) 12/01/2013  . Hyperlipidemia 12/01/2013  . Ischemic cardiomyopathy 12/01/2013  . Sleep apnea      Assessment: 67 yoM admitted with AFib RVR started on IV heparin. CBC stable, pltc low since admit. HIT screen negative, failed DCCV 9/30. Pharmacy to change to apixaban -heparin level at goal  Goal of Therapy:  Heparin level 0.3-0.7 units/ml Monitor platelets by anticoagulation protocol: Yes   Plan:  Discontinue heparin  Apixaban 5mg  po bid Will provide education  Hildred Laser, PharmD Clinical Pharmacist **Pharmacist phone directory can now be found on [email or web address] ([email or web address]).  Listed under [place].

## 2020-09-15 LAB — BASIC METABOLIC PANEL
Anion gap: 7 (ref 5–15)
BUN: 19 mg/dL (ref 8–23)
CO2: 28 mmol/L (ref 22–32)
Calcium: 8.2 mg/dL — ABNORMAL LOW (ref 8.9–10.3)
Chloride: 101 mmol/L (ref 98–111)
Creatinine, Ser: 1.17 mg/dL (ref 0.61–1.24)
GFR calc Af Amer: >60 mL/min (ref >60)
GFR calc non Af Amer: >60 mL/min (ref >60)
Glucose, Bld: 172 mg/dL — ABNORMAL HIGH (ref 70–99)
Potassium: 4.3 mmol/L (ref 3.5–5.1)
Sodium: 136 mmol/L (ref 135–145)

## 2020-09-15 LAB — VITAMIN B12: Vitamin B-12: 683 pg/mL (ref 180–914)

## 2020-09-15 LAB — CBC
HCT: 40.2 % (ref 39.0–52.0)
Hemoglobin: 13.1 g/dL (ref 13.0–17.0)
MCH: 33.9 pg (ref 26.0–34.0)
MCHC: 32.6 g/dL (ref 30.0–36.0)
MCV: 104.1 fL — ABNORMAL HIGH (ref 80.0–100.0)
Platelets: 155 10*3/uL (ref 150–400)
RBC: 3.86 MIL/uL — ABNORMAL LOW (ref 4.22–5.81)
RDW: 17.9 % — ABNORMAL HIGH (ref 11.5–15.5)
WBC: 19.5 10*3/uL — ABNORMAL HIGH (ref 4.0–10.5)
nRBC: 0.4 % — ABNORMAL HIGH (ref 0.0–0.2)

## 2020-09-15 LAB — GLUCOSE, CAPILLARY
Glucose-Capillary: 182 mg/dL — ABNORMAL HIGH (ref 70–99)
Glucose-Capillary: 137 mg/dL — ABNORMAL HIGH (ref 70–99)
Glucose-Capillary: 155 mg/dL — ABNORMAL HIGH (ref 70–99)
Glucose-Capillary: 112 mg/dL — ABNORMAL HIGH (ref 70–99)

## 2020-09-15 LAB — MAGNESIUM: Magnesium: 2.4 mg/dL (ref 1.7–2.4)

## 2020-09-15 LAB — PHOSPHORUS: Phosphorus: 1.8 mg/dL — ABNORMAL LOW (ref 2.5–4.6)

## 2020-09-15 MED ORDER — SODIUM CHLORIDE 0.9 % IV SOLN: INTRAVENOUS | Status: DC

## 2020-09-15 MED ORDER — DOCUSATE SODIUM 100 MG PO CAPS
100.0000 mg | ORAL_CAPSULE | Freq: Two times a day (BID) | ORAL | Status: DC
  Administered 2020-09-15 – 2020-09-29 (×9): 100 mg via ORAL
  Filled 2020-09-15 (×22): qty 1

## 2020-09-15 MED ORDER — DILTIAZEM HCL ER COATED BEADS 120 MG PO CP24
120.0000 mg | ORAL_CAPSULE | Freq: Every day | ORAL | Status: DC
  Administered 2020-09-15: 120 mg via ORAL
  Filled 2020-09-15: qty 1

## 2020-09-15 NOTE — Progress Notes (Signed)
Mobility Specialist - Progress Note   09/15/20 1207  Mobility  Activity Refused mobility   Pt repeatedly replying "No." when asked if he'd like to mobilize and would not elaborate further.   Pricilla Handler Mobility Specialist Mobility Specialist Phone: [phone]

## 2020-09-15 NOTE — Progress Notes (Signed)
Progress Note  Patient Name: Gregory [name] Date of Encounter: 09/15/2020  [facility] Cardiologist: Sanda Klein, MD   Subjective   No CP; mild dyspnea  Inpatient Medications    Scheduled Meds: . amiodarone  400 mg Oral BID  . apixaban  5 mg Oral BID  . atorvastatin  80 mg Oral Daily  . Chlorhexidine Gluconate Cloth  6 each Topical Daily  . diltiazem  30 mg Oral Q8H  . insulin aspart  0-20 Units Subcutaneous TID WC  . insulin aspart  0-5 Units Subcutaneous QHS  . levETIRAcetam  500 mg Oral BID  . metoprolol tartrate  25 mg Oral BID  . pantoprazole  40 mg Oral QHS   Continuous Infusions:  PRN Meds: acetaminophen, docusate sodium, polyethylene glycol, traZODone   Vital Signs    Vitals:   09/15/20 0357 09/15/20 0500 09/15/20 0738 09/15/20 0820  BP:   139/82   Pulse:  (!) 102 (!) 105 100  Resp:  (!) 21 18 18   Temp:   97.6 F (36.4 C)   TempSrc:   Oral   SpO2:  94% 94% 93%  Weight: (!) 140.2 kg     Height:        Intake/Output Summary (Last 24 hours) at 09/15/2020 1050 Last data filed at 09/14/2020 2001 Gross per 24 hour  Intake 600 ml  Output 900 ml  Net -300 ml   Last 3 Weights 09/15/2020 09/14/2020 09/13/2020  Weight (lbs) 309 lb 314 lb 6 oz 312 lb 6.3 oz  Weight (kg) 140.161 kg 142.6 kg 141.7 kg      Telemetry    Atrial fibrillation - Personally Reviewed   Physical Exam   GEN: No acute distress.   Neck: supple Cardiac: irregular and tachycardic Respiratory: Diminished BS base GI: Soft, nontender, non-distended  MS: No edema Neuro:  Nonfocal  Psych: Normal affect   Labs    High Sensitivity Troponin:   Recent Labs  Lab 09/09/20 1336  TROPONINIHS 48*      Chemistry Recent Labs  Lab 09/10/20 1407 09/10/20 1407 09/10/20 1645 09/10/20 2246 09/11/20 0420 09/12/20 0337 09/13/20 0323 09/14/20 0426 09/15/20 0428  NA QUESTIONABLE RESULTS, RECOMMEND RECOLLECT TO VERIFY   < > 138   < > 136   < > 136 135 136  K  QUESTIONABLE RESULTS, RECOMMEND RECOLLECT TO VERIFY   < > 2.5*   < > 3.6   < > 4.9 4.1 4.3  CL QUESTIONABLE RESULTS, RECOMMEND RECOLLECT TO VERIFY   < > 110   < > 104   < > 101 101 101  CO2 QUESTIONABLE RESULTS, RECOMMEND RECOLLECT TO VERIFY   < > 17*   < > 21*   < > 25 27 28   GLUCOSE QUESTIONABLE RESULTS, RECOMMEND RECOLLECT TO VERIFY   < > 206*   < > 142*   < > 163* 138* 172*  BUN QUESTIONABLE RESULTS, RECOMMEND RECOLLECT TO VERIFY   < > 35*   < > 32*   < > 15 17 19   CREATININE QUESTIONABLE RESULTS, RECOMMEND RECOLLECT TO VERIFY   < > 1.48*   < > 1.53*   < > 1.06 1.11 1.17  CALCIUM QUESTIONABLE RESULTS, RECOMMEND RECOLLECT TO VERIFY   < > <4.0*   < > 6.1*   < > 7.2* 7.9* 8.2*  PROT QUESTIONABLE RESULTS, RECOMMEND RECOLLECT TO VERIFY  --  3.7*  --  5.3*  --   --   --   --  ALBUMIN QUESTIONABLE RESULTS, RECOMMEND RECOLLECT TO VERIFY  --  1.3*  --  1.9*  --   --   --   --   AST QUESTIONABLE RESULTS, RECOMMEND RECOLLECT TO VERIFY  --  37  --  44*  --   --   --   --   ALT QUESTIONABLE RESULTS, RECOMMEND RECOLLECT TO VERIFY  --  23  --  31  --   --   --   --   ALKPHOS QUESTIONABLE RESULTS, RECOMMEND RECOLLECT TO VERIFY  --  35*  --  60  --   --   --   --   BILITOT QUESTIONABLE RESULTS, RECOMMEND RECOLLECT TO VERIFY  --  0.5  --  0.9  --   --   --   --   GFRNONAA QUESTIONABLE RESULTS, RECOMMEND RECOLLECT TO VERIFY   < > 50*   < > 48*   < > >60 >60 >60  GFRAA QUESTIONABLE RESULTS, RECOMMEND RECOLLECT TO VERIFY   < > 58*   < > 56*   < > >60 >60 >60  ANIONGAP QUESTIONABLE RESULTS, RECOMMEND RECOLLECT TO VERIFY   < > 11   < > 11   < > 10 7 7    < > = values in this interval not displayed.     Hematology Recent Labs  Lab 09/14/20 0426 09/14/20 0858 09/15/20 0428  WBC 16.8* 17.3* 19.5*  RBC 4.00* 3.97* 3.86*  HGB 13.3 13.4 13.1  HCT 41.2 40.9 40.2  MCV 103.0* 103.0* 104.1*  MCH 33.3 33.8 33.9  MCHC 32.3 32.8 32.6  RDW 17.6* 17.8* 17.9*  PLT 128* 122* 155    Radiology    DG Chest 2  View  Result Date: 09/14/2020 CLINICAL DATA:  Short of breath.  Progressive pleuritic pain EXAM: CHEST - 2 VIEW COMPARISON:  09/11/2020 FINDINGS: Progressive left lower lobe density due to airspace disease and effusion. Right lung remains clear. Cardiac enlargement without heart failure. AICD again noted. IMPRESSION: Progressive left lower lobe airspace disease and effusion. Possible pneumonia or fluid overload. Electronically Signed   By: Franchot Gallo M.D.   On: 09/14/2020 08:49    Patient Profile     59 y.o. male with a hx of CAD, CHF(EF evaluation as low as 45%, but recently 60-65%)and VT s/p ICDwho presents with shock.  Echocardiogram this admission showed ejection fraction 55 to 60%, mild left atrial enlargement.  Assessment & Plan    1 persistent atrial fibrillation-patient remains in atrial fibrillation today.  Rate is improving.  Continue metoprolol.  Change Cardizem to 120 mg CD daily.  Continue apixaban.  Plan to continue amiodarone load and then plan repeat cardioversion to follow.  Note LV function normal.  TSH normal.  2 shock-this was felt likely secondary to hypervolemia and has resolved.  Echocardiogram shows normal LV function.  3 coronary artery disease-plan to continue statin.  He is not on aspirin given need for apixaban.  4 history of ventricular tachycardia-no recent episodes.  ICD in place.  For questions or updates, please contact [place] Please consult [email or web address] for contact info under        Signed, Kirk Ruths, MD  09/15/2020, 10:50 AM

## 2020-09-15 NOTE — Progress Notes (Signed)
PROGRESS NOTE    Gregory [name]  YSA:[id number] DOB: 1961/02/06 DOA: 09/09/2020 PCP: Patient, No Pcp Per   Brief Narrative:  59 year old with history of CAD, diastolic CHF EF 32%, ischemic cardiomyopathy, HTN, HLD, erectile dysfunction, ischemic cardiomyopathy status post cardiac defibrillator admitted for nausea vomiting diarrhea, found to have AKI and hypotension requiring vasopressors.  Initially admitted to the ICU.  Infectious work-up was negative, echocardiogram showed EF 60%, normal LV, abdominal ultrasound showed cholelithiasis.  Also new onset A. fib likely and cardiology was consulted.  Initially attempted IV amiodarone, placed on IV heparin.  Cardioversion was planned but abandoned due to anticoagulation issues.  While on heparin drip platelets significantly dropped with concerns of sepsis versus HIT.  HIT antibodies were sent.  EP team consulted.  Underwent cardioversion on 9/30 which was unsuccessful.   Assessment & Plan:   Principal Problem:   Atrial fibrillation with rapid ventricular response (HCC) Active Problems:   Chronic combined systolic and diastolic heart failure (HCC)   CAD S/P percutaneous coronary angioplasty   Essential hypertension   Ventricular tachycardia (HCC)   Seizure disorder (HCC)   CHF (congestive heart failure) (HCC)   Persistent atrial fibrillation (HCC)   Thrombocytopenia (HCC)   Shock ([place])   Atrial fibrillation with RVR, new onset -Remains uncontrolled.  Further management per cardiology team -Rate control/antiarrhythmic management per cardiology team.  Currently being loaded with amiodarone then will likely reattempt another cardioversion. -Continue metoprolol, and Cardizem -EP team consulted.  Cardioversion unsuccessful on 09/13/2020. -TSH normal  Hypovolemic shock with severe dehydration, resolved Nausea vomiting diarrhea -No evidence of infection.  This was likely secondary to nausea vomiting and diarrhea.  Initially required  vasopressors but now discontinued.  The symptoms have resolved for now.  Culture data remains negative. -Closely monitor blood pressure, input and output.  Acute kidney injury -Currently resolved.  Admission creatinine 3.05, this is trended down to 1.1  Thrombocytopenia -Question of possible HIT.  HIT antibodies labs sent. -Platelets stable, closely monitor.  Hypocalcemia -Initially 5.1, improved with calcium gluconate, 7.9  Severe coronary artery disease status post ICD implant, status post PCI History of ventricular tachycardia status post [employer] ICD -Currently without any chest pain.  Macrocytic anemia -TSH normal -Check B12 and folate  Congestive heart failure with preserved ejection fraction, 55% -Initially very dehydrated requiring IV fluids.  Obstructive sleep apnea -CPAP  Mild transaminitis, improved -Abdominal ultrasound negative for acute cholecystitis.  Asymptomatic bacteriuria -No need for antibiotics  History of seizures -On Keppra 5 mg p.o. twice daily  Advised nursing staff to get him out of bed to chair as much as possible daily.  Monitor his bowel movement abdominal distention.  DVT prophylaxis: Eliquis Code Status: Full code Family Communication:    Status is: Inpatient  Remains inpatient appropriate because:Inpatient level of care appropriate due to severity of illness   Dispo: The patient is from: Home              Anticipated d/c is to: Home              Anticipated d/c date is: 2 days              Patient currently is not medically stable to d/c.  Still remains in atrial fibrillation with RVR with unsuccessful cardioversion.  Maintain hospital stay for now.   Body mass index is 35.71 kg/m.    Subjective: Patient doing okay this morning, tells me he has been having daily bowel movement and passing gas.  Heart rate still remains uncontrolled in atrial fibrillation as high as 130.   Examination: Constitutional: Not in acute  distress Respiratory: Clear to auscultation bilaterally Cardiovascular: Tachycardia, irregularly irregular Abdomen: Nontender nondistended good bowel sounds Musculoskeletal: No edema noted Skin: No rashes seen Neurologic: CN 2-12 grossly intact.  And nonfocal Psychiatric: Normal judgment and insight. Alert and oriented x 3. Normal mood.   Objective: Vitals:   09/15/20 0357 09/15/20 0500 09/15/20 0738 09/15/20 0820  BP:   139/82   Pulse:  (!) 102 (!) 105 100  Resp:  (!) 21 18 18   Temp:   97.6 F (36.4 C)   TempSrc:   Oral   SpO2:  94% 94% 93%  Weight: (!) 140.2 kg     Height:        Intake/Output Summary (Last 24 hours) at 09/15/2020 1103 Last data filed at 09/14/2020 2001 Gross per 24 hour  Intake 600 ml  Output 900 ml  Net -300 ml   Filed Weights   09/13/20 0400 09/14/20 0422 09/15/20 0357  Weight: (!) 141.7 kg (!) 142.6 kg (!) 140.2 kg     Data Reviewed:   CBC: Recent Labs  Lab 09/09/20 1336 09/09/20 1758 09/13/20 0323 09/13/20 1024 09/14/20 0426 09/14/20 0858 09/15/20 0428  WBC 10.6*   < > 11.2* 12.9* 16.8* 17.3* 19.5*  NEUTROABS 8.7*  --   --   --   --  16.1*  --   HGB 16.6   < > 13.5 13.6 13.3 13.4 13.1  HCT 48.3   < > 39.7 40.7 41.2 40.9 40.2  MCV 98.8   < > 101.5* 103.6* 103.0* 103.0* 104.1*  PLT PLATELET CLUMPS NOTED ON SMEAR, UNABLE TO ESTIMATE   < > 114* 102* 128* 122* 155   < > = values in this interval not displayed.   Basic Metabolic Panel: Recent Labs  Lab 09/11/20 0420 09/12/20 0337 09/13/20 0323 09/14/20 0426 09/15/20 0428  NA 136 135 136 135 136  K 3.6 3.7 4.9 4.1 4.3  CL 104 103 101 101 101  CO2 21* 21* 25 27 28   GLUCOSE 142* 144* 163* 138* 172*  BUN 32* 20 15 17 19   CREATININE 1.53* 1.07 1.06 1.11 1.17  CALCIUM 6.1* 6.4* 7.2* 7.9* 8.2*  MG 2.1 2.1 2.2 2.3 2.4  PHOS 2.1* 2.2* 2.6 2.2* 1.8*   GFR: Estimated Creatinine Clearance: 104 mL/min (by C-G formula based on SCr of 1.17 mg/dL). Liver Function Tests: Recent Labs  Lab  09/09/20 1336 09/09/20 1810 09/10/20 1407 09/10/20 1645 09/11/20 0420  AST 88* 83* QUESTIONABLE RESULTS, RECOMMEND RECOLLECT TO VERIFY 37 44*  ALT 35 34 QUESTIONABLE RESULTS, RECOMMEND RECOLLECT TO VERIFY 23 31  ALKPHOS 44 41 QUESTIONABLE RESULTS, RECOMMEND RECOLLECT TO VERIFY 35* 60  BILITOT 1.3* 1.2 QUESTIONABLE RESULTS, RECOMMEND RECOLLECT TO VERIFY 0.5 0.9  PROT 4.6* 4.6* QUESTIONABLE RESULTS, RECOMMEND RECOLLECT TO VERIFY 3.7* 5.3*  ALBUMIN 2.0* 2.0* QUESTIONABLE RESULTS, RECOMMEND RECOLLECT TO VERIFY 1.3* 1.9*   Recent Labs  Lab 09/09/20 1810  LIPASE 120*  AMYLASE 98   No results for input(s): AMMONIA in the last 168 hours. Coagulation Profile: Recent Labs  Lab 09/09/20 1810  INR 1.4*   Cardiac Enzymes: No results for input(s): CKTOTAL, CKMB, CKMBINDEX, TROPONINI in the last 168 hours. BNP (last 3 results) No results for input(s): PROBNP in the last 8760 hours. HbA1C: No results for input(s): HGBA1C in the last 72 hours. CBG: Recent Labs  Lab 09/14/20 0612 09/14/20 1130 09/14/20 1649  09/14/20 2141 09/15/20 0631  GLUCAP 126* 126* 129* 174* 182*   Lipid Profile: No results for input(s): CHOL, HDL, LDLCALC, TRIG, CHOLHDL, LDLDIRECT in the last 72 hours. Thyroid Function Tests: No results for input(s): TSH, T4TOTAL, FREET4, T3FREE, THYROIDAB in the last 72 hours. Anemia Panel: Recent Labs    09/15/20 0428  [id number]   Sepsis Labs: Recent Labs  Lab 09/09/20 1336 09/09/20 1640 09/09/20 1810 09/09/20 1942  PROCALCITON  --   --  0.70  --   LATICACIDVEN 3.2* 3.2*  --  2.9*    Recent Results (from the past 240 hour(s))  Urine Culture     Status: None   Collection Time: 09/09/20  4:40 PM   Specimen: Urine, Random  Result Value Ref Range Status   Specimen Description URINE, RANDOM  Final   Special Requests NONE  Final   Culture   Final    NO GROWTH Performed at [facility], [place] [address] [place], [place] [address]    Report Status  09/10/2020 FINAL  Final  Respiratory Panel by RT PCR (Flu A&B, Covid) - Urine, Catheterized     Status: None   Collection Time: 09/09/20  4:40 PM   Specimen: Urine, Catheterized; Nasopharyngeal  Result Value Ref Range Status   SARS Coronavirus 2 by RT PCR NEGATIVE NEGATIVE Final    Comment: (NOTE) SARS-CoV-2 target nucleic acids are NOT DETECTED.  The SARS-CoV-2 RNA is generally detectable in upper respiratoy specimens during the acute phase of infection. The lowest concentration of SARS-CoV-2 viral copies this assay can detect is 131 copies/mL. A negative result does not preclude SARS-Cov-2 infection and should not be used as the sole basis for treatment or other patient management decisions. A negative result may occur with  improper specimen collection/handling, submission of specimen other than nasopharyngeal swab, presence of viral mutation(s) within the areas targeted by this assay, and inadequate number of viral copies (<131 copies/mL). A negative result must be combined with clinical observations, patient history, and epidemiological information. The expected result is Negative.  Fact Sheet for Patients:  PinkCheek.be  Fact Sheet for Healthcare Providers:  GravelBags.it  This test is no t yet approved or cleared by the [place] FDA and  has been authorized for detection and/or diagnosis of SARS-CoV-2 by FDA under an Emergency Use Authorization (EUA). This EUA will remain  in effect (meaning this test can be used) for the duration of the COVID-19 declaration under Section 564(b)(1) of the Act, 21 U.S.C. section 360bbb-3(b)(1), unless the authorization is terminated or revoked sooner.     Influenza A by PCR NEGATIVE NEGATIVE Final   Influenza B by PCR NEGATIVE NEGATIVE Final    Comment: (NOTE) The Xpert Xpress SARS-CoV-2/FLU/RSV assay is intended as an aid in  the diagnosis of influenza from Nasopharyngeal  swab specimens and  should not be used as a sole basis for treatment. Nasal washings and  aspirates are unacceptable for Xpert Xpress SARS-CoV-2/FLU/RSV  testing.  Fact Sheet for Patients: PinkCheek.be  Fact Sheet for Healthcare Providers: GravelBags.it  This test is not yet approved or cleared by the [place] FDA and  has been authorized for detection and/or diagnosis of SARS-CoV-2 by  FDA under an Emergency Use Authorization (EUA). This EUA will remain  in effect (meaning this test can be used) for the duration of the  Covid-19 declaration under Section 564(b)(1) of the Act, 21  U.S.C. section 360bbb-3(b)(1), unless the authorization is  terminated or revoked. Performed at  [facility], [place] [address], [place], [place] [address]   Culture, blood (routine x 2)     Status: None   Collection Time: 09/09/20  7:50 PM   Specimen: BLOOD LEFT ARM  Result Value Ref Range Status   Specimen Description BLOOD LEFT ARM  Final   Special Requests   Final    BOTTLES DRAWN AEROBIC AND ANAEROBIC Blood Culture adequate volume   Culture   Final    NO GROWTH 5 DAYS Performed at [facility], [place] [address], [place], [place] [address]    Report Status 09/14/2020 FINAL  Final  Culture, blood (routine x 2)     Status: None   Collection Time: 09/09/20  7:54 PM   Specimen: BLOOD RIGHT ARM  Result Value Ref Range Status   Specimen Description BLOOD RIGHT ARM  Final   Special Requests   Final    BOTTLES DRAWN AEROBIC AND ANAEROBIC Blood Culture adequate volume   Culture   Final    NO GROWTH 5 DAYS Performed at [facility], [place] [address] [place], [place] [address]    Report Status 09/14/2020 FINAL  Final  Urine culture     Status: None   Collection Time: 09/10/20 12:31 AM   Specimen: Urine, Random  Result Value Ref Range Status   Specimen Description URINE, RANDOM  Final   Special Requests NONE  Final   Culture    Final    NO GROWTH Performed at [facility], [place] [address] [place], [place] [address]    Report Status 09/11/2020 FINAL  Final  MRSA PCR Screening     Status: None   Collection Time: 09/10/20  5:05 AM   Specimen: Nasopharyngeal  Result Value Ref Range Status   MRSA by PCR NEGATIVE NEGATIVE Final    Comment:        The GeneXpert MRSA Assay (FDA approved for NASAL specimens only), is one component of a comprehensive MRSA colonization surveillance program. It is not intended to diagnose MRSA infection nor to guide or monitor treatment for MRSA infections. Performed at [facility], [place] [address], [place], [place] [address]          Radiology Studies: DG Chest 2 View  Result Date: 09/14/2020 CLINICAL DATA:  Short of breath.  Progressive pleuritic pain EXAM: CHEST - 2 VIEW COMPARISON:  09/11/2020 FINDINGS: Progressive left lower lobe density due to airspace disease and effusion. Right lung remains clear. Cardiac enlargement without heart failure. AICD again noted. IMPRESSION: Progressive left lower lobe airspace disease and effusion. Possible pneumonia or fluid overload. Electronically Signed   By: Franchot Gallo M.D.   On: 09/14/2020 08:49         Scheduled Meds:  amiodarone  400 mg Oral BID   apixaban  5 mg Oral BID   atorvastatin  80 mg Oral Daily   Chlorhexidine Gluconate Cloth  6 each Topical Daily   diltiazem  30 mg Oral Q8H   insulin aspart  0-20 Units Subcutaneous TID WC   insulin aspart  0-5 Units Subcutaneous QHS   levETIRAcetam  500 mg Oral BID   metoprolol tartrate  25 mg Oral BID   pantoprazole  40 mg Oral QHS   Continuous Infusions:     LOS: 6 days   Time spent= 35 mins    Gwenn Teodoro Arsenio Loader, MD [facility]  If 7PM-7AM, please contact night-coverage  09/15/2020, 11:03 AM

## 2020-09-15 NOTE — Progress Notes (Signed)
Plans for amiodarone load noted.  Dr Caryl Comes to follow-up next week.  EP to see as needed this weekend.  Thompson Grayer MD, [facility] 09/15/2020 11:26 AM

## 2020-09-15 NOTE — Progress Notes (Signed)
Pt refused cpap for tonight 

## 2020-09-16 ENCOUNTER — Encounter (HOSPITAL_COMMUNITY): Payer: Self-pay | Admitting: Internal Medicine

## 2020-09-16 ENCOUNTER — Inpatient Hospital Stay (HOSPITAL_COMMUNITY): Payer: Worker's Compensation

## 2020-09-16 LAB — URINALYSIS, ROUTINE W REFLEX MICROSCOPIC
Bilirubin Urine: NEGATIVE
Glucose, UA: 50 mg/dL — AB
Hgb urine dipstick: NEGATIVE
Ketones, ur: 5 mg/dL — AB
Leukocytes,Ua: NEGATIVE
Nitrite: NEGATIVE
Protein, ur: 100 mg/dL — AB
Specific Gravity, Urine: 1.018 (ref 1.005–1.030)
pH: 5 (ref 5.0–8.0)

## 2020-09-16 LAB — GLUCOSE, CAPILLARY
Glucose-Capillary: 143 mg/dL — ABNORMAL HIGH (ref 70–99)
Glucose-Capillary: 150 mg/dL — ABNORMAL HIGH (ref 70–99)
Glucose-Capillary: 156 mg/dL — ABNORMAL HIGH (ref 70–99)
Glucose-Capillary: 171 mg/dL — ABNORMAL HIGH (ref 70–99)
Glucose-Capillary: 192 mg/dL — ABNORMAL HIGH (ref 70–99)

## 2020-09-16 LAB — BASIC METABOLIC PANEL
Anion gap: 8 (ref 5–15)
BUN: 20 mg/dL (ref 8–23)
CO2: 28 mmol/L (ref 22–32)
Calcium: 8.3 mg/dL — ABNORMAL LOW (ref 8.9–10.3)
Chloride: 102 mmol/L (ref 98–111)
Creatinine, Ser: 1.18 mg/dL (ref 0.61–1.24)
GFR calc Af Amer: >60 mL/min (ref >60)
GFR calc non Af Amer: >60 mL/min (ref >60)
Glucose, Bld: 190 mg/dL — ABNORMAL HIGH (ref 70–99)
Potassium: 4.6 mmol/L (ref 3.5–5.1)
Sodium: 138 mmol/L (ref 135–145)

## 2020-09-16 LAB — CBC
HCT: 39.6 % (ref 39.0–52.0)
Hemoglobin: 12.4 g/dL — ABNORMAL LOW (ref 13.0–17.0)
MCH: 33.1 pg (ref 26.0–34.0)
MCHC: 31.3 g/dL (ref 30.0–36.0)
MCV: 105.6 fL — ABNORMAL HIGH (ref 80.0–100.0)
Platelets: 167 10*3/uL (ref 150–400)
RBC: 3.75 MIL/uL — ABNORMAL LOW (ref 4.22–5.81)
RDW: 18.2 % — ABNORMAL HIGH (ref 11.5–15.5)
WBC: 24.5 10*3/uL — ABNORMAL HIGH (ref 4.0–10.5)
nRBC: 0.3 % — ABNORMAL HIGH (ref 0.0–0.2)

## 2020-09-16 LAB — AMMONIA: Ammonia: 17 umol/L (ref 9–35)

## 2020-09-16 LAB — MAGNESIUM: Magnesium: 2.3 mg/dL (ref 1.7–2.4)

## 2020-09-16 MED ORDER — FUROSEMIDE 10 MG/ML IJ SOLN
40.0000 mg | Freq: Once | INTRAMUSCULAR | Status: AC
  Administered 2020-09-16: 40 mg via INTRAVENOUS
  Filled 2020-09-16: qty 4

## 2020-09-16 MED ORDER — METOPROLOL TARTRATE 5 MG/5ML IV SOLN
5.0000 mg | INTRAVENOUS | Status: DC | PRN
  Administered 2020-09-16: 5 mg via INTRAVENOUS
  Filled 2020-09-16: qty 5

## 2020-09-16 MED ORDER — TRAZODONE HCL 100 MG PO TABS
100.0000 mg | ORAL_TABLET | Freq: Every evening | ORAL | Status: DC | PRN
  Administered 2020-09-18 – 2020-09-28 (×10): 100 mg via ORAL
  Filled 2020-09-16 (×12): qty 1

## 2020-09-16 MED ORDER — DILTIAZEM HCL ER COATED BEADS 180 MG PO CP24
180.0000 mg | ORAL_CAPSULE | Freq: Every day | ORAL | Status: DC
  Administered 2020-09-17 – 2020-09-24 (×8): 180 mg via ORAL
  Filled 2020-09-16 (×8): qty 1

## 2020-09-16 MED ORDER — ENOXAPARIN SODIUM 150 MG/ML ~~LOC~~ SOLN
150.0000 mg | Freq: Two times a day (BID) | SUBCUTANEOUS | Status: DC
  Administered 2020-09-16 – 2020-09-17 (×2): 150 mg via SUBCUTANEOUS
  Filled 2020-09-16 (×3): qty 1

## 2020-09-16 MED ORDER — SODIUM CHLORIDE 0.9 % IV SOLN
2.0000 g | INTRAVENOUS | Status: DC
  Administered 2020-09-16 – 2020-09-18 (×3): 2 g via INTRAVENOUS
  Filled 2020-09-16 (×3): qty 2

## 2020-09-16 MED ORDER — LEVETIRACETAM IN NACL 500 MG/100ML IV SOLN
500.0000 mg | Freq: Two times a day (BID) | INTRAVENOUS | Status: DC
  Administered 2020-09-16 – 2020-09-17 (×3): 500 mg via INTRAVENOUS
  Filled 2020-09-16 (×4): qty 100

## 2020-09-16 NOTE — Progress Notes (Signed)
Progress Note  Patient Name: Gregory [name] Date of Encounter: 09/16/2020  [facility] Cardiologist: Sanda Klein, MD   Subjective   Patient somewhat lethargic this morning.  However alert and oriented x3.  Denies dyspnea or chest pain.  Inpatient Medications    Scheduled Meds: . amiodarone  400 mg Oral BID  . apixaban  5 mg Oral BID  . atorvastatin  80 mg Oral Daily  . Chlorhexidine Gluconate Cloth  6 each Topical Daily  . diltiazem  120 mg Oral Daily  . docusate sodium  100 mg Oral BID  . insulin aspart  0-20 Units Subcutaneous TID WC  . insulin aspart  0-5 Units Subcutaneous QHS  . levETIRAcetam  500 mg Oral BID  . metoprolol tartrate  25 mg Oral BID  . pantoprazole  40 mg Oral QHS   Continuous Infusions: . sodium chloride 75 mL/hr at 09/16/20 0300   PRN Meds: acetaminophen, polyethylene glycol, traZODone   Vital Signs    Vitals:   09/15/20 1948 09/15/20 2258 09/16/20 0546 09/16/20 0725  BP: 121/89 (!) 124/92 114/80 135/79  Pulse: 97 93 97 (!) 106  Resp: 19 19 (!) 21 20  Temp: 97.6 F (36.4 C) 98.2 F (36.8 C) 97.8 F (36.6 C) 98.2 F (36.8 C)  TempSrc: Oral Oral Oral Oral  SpO2: 94% 100% 100% 97%  Weight:      Height:        Intake/Output Summary (Last 24 hours) at 09/16/2020 0824 Last data filed at 09/16/2020 0300 Gross per 24 hour  Intake 1538.31 ml  Output 225 ml  Net 1313.31 ml   Last 3 Weights 09/15/2020 09/14/2020 09/13/2020  Weight (lbs) 309 lb 314 lb 6 oz 312 lb 6.3 oz  Weight (kg) 140.161 kg 142.6 kg 141.7 kg      Telemetry    Atrial fibrillation with PVCs or aberrantly conducted beats, rate mildly elevated- Personally Reviewed   Physical Exam   GEN: No acute distress.  WD Neck: supple Cardiac: irregular and tachycardic Respiratory: Diminished BS base; no wheeze GI: Soft, NT/ND, abdominal wall edema noted. MS: No edema Neuro: Grossly intact Psych: Normal affect   Labs    High Sensitivity Troponin:   Recent  Labs  Lab 09/09/20 1336  TROPONINIHS 48*      Chemistry Recent Labs  Lab 09/10/20 1407 09/10/20 1407 09/10/20 1645 09/10/20 2246 09/11/20 0420 09/12/20 0337 09/14/20 0426 09/15/20 0428 09/16/20 0601  NA QUESTIONABLE RESULTS, RECOMMEND RECOLLECT TO VERIFY   < > 138   < > 136   < > 135 136 138  K QUESTIONABLE RESULTS, RECOMMEND RECOLLECT TO VERIFY   < > 2.5*   < > 3.6   < > 4.1 4.3 4.6  CL QUESTIONABLE RESULTS, RECOMMEND RECOLLECT TO VERIFY   < > 110   < > 104   < > 101 101 102  CO2 QUESTIONABLE RESULTS, RECOMMEND RECOLLECT TO VERIFY   < > 17*   < > 21*   < > 27 28 28   GLUCOSE QUESTIONABLE RESULTS, RECOMMEND RECOLLECT TO VERIFY   < > 206*   < > 142*   < > 138* 172* 190*  BUN QUESTIONABLE RESULTS, RECOMMEND RECOLLECT TO VERIFY   < > 35*   < > 32*   < > 17 19 20   CREATININE QUESTIONABLE RESULTS, RECOMMEND RECOLLECT TO VERIFY   < > 1.48*   < > 1.53*   < > 1.11 1.17 1.18  CALCIUM QUESTIONABLE RESULTS, RECOMMEND RECOLLECT  TO VERIFY   < > <4.0*   < > 6.1*   < > 7.9* 8.2* 8.3*  PROT QUESTIONABLE RESULTS, RECOMMEND RECOLLECT TO VERIFY  --  3.7*  --  5.3*  --   --   --   --   ALBUMIN QUESTIONABLE RESULTS, RECOMMEND RECOLLECT TO VERIFY  --  1.3*  --  1.9*  --   --   --   --   AST QUESTIONABLE RESULTS, RECOMMEND RECOLLECT TO VERIFY  --  37  --  44*  --   --   --   --   ALT QUESTIONABLE RESULTS, RECOMMEND RECOLLECT TO VERIFY  --  23  --  31  --   --   --   --   ALKPHOS QUESTIONABLE RESULTS, RECOMMEND RECOLLECT TO VERIFY  --  35*  --  60  --   --   --   --   BILITOT QUESTIONABLE RESULTS, RECOMMEND RECOLLECT TO VERIFY  --  0.5  --  0.9  --   --   --   --   GFRNONAA QUESTIONABLE RESULTS, RECOMMEND RECOLLECT TO VERIFY   < > 50*   < > 48*   < > >60 >60 >60  GFRAA QUESTIONABLE RESULTS, RECOMMEND RECOLLECT TO VERIFY   < > 58*   < > 56*   < > >60 >60 >60  ANIONGAP QUESTIONABLE RESULTS, RECOMMEND RECOLLECT TO VERIFY   < > 11   < > 11   < > 7 7 8    < > = values in this interval not displayed.      Hematology Recent Labs  Lab 09/14/20 0426 09/14/20 0858 09/15/20 0428  WBC 16.8* 17.3* 19.5*  RBC 4.00* 3.97* 3.86*  HGB 13.3 13.4 13.1  HCT 41.2 40.9 40.2  MCV 103.0* 103.0* 104.1*  MCH 33.3 33.8 33.9  MCHC 32.3 32.8 32.6  RDW 17.6* 17.8* 17.9*  PLT 128* 122* 155    Radiology    DG Chest 2 View  Result Date: 09/14/2020 CLINICAL DATA:  Short of breath.  Progressive pleuritic pain EXAM: CHEST - 2 VIEW COMPARISON:  09/11/2020 FINDINGS: Progressive left lower lobe density due to airspace disease and effusion. Right lung remains clear. Cardiac enlargement without heart failure. AICD again noted. IMPRESSION: Progressive left lower lobe airspace disease and effusion. Possible pneumonia or fluid overload. Electronically Signed   By: Franchot Gallo M.D.   On: 09/14/2020 08:49    Patient Profile     59 y.o. male with a hx of CAD, CHF(EF evaluation as low as 45%, but recently 60-65%)and VT s/p ICDwho presents with shock.  Echocardiogram this admission showed ejection fraction 55 to 60%, mild left atrial enlargement.  Assessment & Plan    1 persistent atrial fibrillation-patient remains in atrial fibrillation today.  Rate is mildly elevated.  Continue metoprolol and cardizem (increase to 180 mg daily.  Continue apixaban.  Plan to continue amiodarone load and then plan repeat cardioversion to follow.  Note LV function normal.  TSH normal.  2 shock-this was felt likely secondary to hypervolemia and has resolved.  Echocardiogram shows normal LV function.  3 coronary artery disease-plan to continue statin.  He is not on aspirin given need for apixaban.  4 history of ventricular tachycardia-no recent episodes.  ICD in place.  5 acute diastolic congestive heart failure-I/O since admission +[id number].  He appears to be volume overloaded today.  Discontinue IV fluids.  We will give Lasix 40 mg IV x1.  For questions or updates, please contact [place] Please consult [email or web address] for  contact info under        Signed, Kirk Ruths, MD  09/16/2020, 8:24 AM

## 2020-09-16 NOTE — Progress Notes (Signed)
Mobility Specialist - Progress Note   09/16/20 1218  Mobility  Activity  (Bed exercises)  Range of Motion/Exercises All extremities  Level of Assistance Total care  Assistive Device None  Mobility Response Tolerated well  Mobility performed by Mobility specialist  $Mobility charge 1 Mobility    Pt unable to respond to commands, so ambulation was differed for safety. Pt would not respond to commands for bed exercises, so each extremity was guided through ROM exercises by myself.   Pricilla Handler Mobility Specialist Mobility Specialist Phone: [phone]

## 2020-09-16 NOTE — Progress Notes (Addendum)
CT head done showed subtle hypodensity and loss of gray-white matter in the right insula and frontal lobe area.  Could be an artifact versus acute MCA infarct?Gregory [name] Unable to get MRI due to his ICD (Not MRI compatible). Continue rest of the care as planned.

## 2020-09-16 NOTE — Progress Notes (Signed)
Pt has unsafe ICD. Message sent to order to make aware  Model #:[id number]

## 2020-09-16 NOTE — Progress Notes (Signed)
Patient wearing CPAP when RT entered room. Patient tolerating CPAP well at this time. No respiratory distress noted. RT will monitor as needed. 

## 2020-09-16 NOTE — Progress Notes (Addendum)
PROGRESS NOTE    Gregory [name]  YBO:[id number] DOB: 1961/09/16 DOA: 09/09/2020 PCP: Patient, No Pcp Per   Brief Narrative:  59 year old with history of CAD, diastolic CHF EF 27%, ischemic cardiomyopathy, HTN, HLD, erectile dysfunction, ischemic cardiomyopathy status post cardiac defibrillator admitted for nausea vomiting diarrhea, found to have AKI and hypotension requiring vasopressors.  Initially admitted to the ICU.  Infectious work-up was negative, echocardiogram showed EF 60%, normal LV, abdominal ultrasound showed cholelithiasis.  Also new onset A. fib likely and cardiology was consulted.  Initially attempted IV amiodarone, placed on IV heparin.  Cardioversion was planned but abandoned due to anticoagulation issues.  While on heparin drip platelets significantly dropped with concerns of sepsis versus HIT.  HIT antibodies were sent.  EP team consulted.  Underwent cardioversion on 9/30 which was unsuccessful.  Currently loading with amiodarone with likely reattempt for cardioversion per EP.  During this time he had increasing encephalopathy and WBC, suspected UTI.  UA, blood cultures have been sent, and ammonia levels.  Empiric Rocephin is ordered to be started.   Assessment & Plan:   Principal Problem:   Atrial fibrillation with rapid ventricular response (HCC) Active Problems:   Chronic combined systolic and diastolic heart failure (HCC)   CAD S/P percutaneous coronary angioplasty   Essential hypertension   Ventricular tachycardia (HCC)   Seizure disorder (HCC)   CHF (congestive heart failure) (HCC)   Persistent atrial fibrillation (HCC)   Thrombocytopenia (HCC)   Shock (HCC)    Atrial fibrillation with RVR, new onset -Rate remains uncontrolled.  Cardiology and EP following. -Currently on AV nodal blocker/antiarrhythmic.  Amiodarone loading ongoing with plans for reattempt cardioversion. -Continue metoprolol, and Cardizem -EP team consulted.  Cardioversion  unsuccessful on 09/13/2020. -TSH normal -Replete electrolytes as appropriate -Eliquis converted to Lovenox for now until his p.o. intake is consistent  Mild encephalopathy Leukocytosis.  - Suspect could have been secondary to Ambien with component of delirium which has been discontinued for now.  Will attempt to use trazodone as needed for insomnia Also a worry about occult infection, likely UTI. Will check bladder scan for retention.  Empiric Rocephin after obtaining UA and BCx CT head ordered to rule out any bleed especially him being on ELiquis   Mild abdominal distention -No bowel movement in few days, checking KUB  Hypovolemic shock with severe dehydration, resolved Nausea vomiting diarrhea -This is now resolved.  Culture data remains negative. -Closely monitor blood pressure, input and output.  Acute kidney injury -Currently resolved.  Admission creatinine 3.05, trended down to 1.18  Thrombocytopenia -Question of possible HIT.  HIT antibodies labs sent. -Platelets are slowly recovering.  No evidence of bleeding  Hypocalcemia -Initially 5.1, now improved after calcium gluconate  Severe coronary artery disease status post ICD implant, status post PCI History of ventricular tachycardia status post [employer] ICD -Currently without any chest pain.  Macrocytic anemia -TSH and B12 normal  Congestive heart failure with preserved ejection fraction, 55% -Initially very dehydrated requiring IV fluids.  Obstructive sleep apnea -CPAP  Mild transaminitis, improved -Abdominal ultrasound negative for acute cholecystitis.  Asymptomatic bacteriuria -No need for antibiotics  History of seizures -On on Keppra 500 mg twice daily  Advised nursing staff for out of bed to chair.  Abdomen mildly distended and therefore advised nursing staff to monitor and ensure he has daily bowel movements.  If necessary we can get KUB  DVT prophylaxis: Eliquis Code Status: Full code Family  Communication: Patient's brother Christy Sartorius has been updated  Status  is: Inpatient  Remains inpatient appropriate because:Inpatient level of care appropriate due to severity of illness   Dispo: The patient is from: Home              Anticipated d/c is to: Home              Anticipated d/c date is: 2 days              Patient currently is not medically stable to d/c.  Still remains in atrial fibrillation with RVR with unsuccessful cardioversion.  Maintain hospital stay for now.   Body mass index is 35.71 kg/m.    Subjective: Confused this morning but no focal neuro deficits.   Examination: Constitutional: Not in acute distress Respiratory: Clear to auscultation bilaterally Cardiovascular: Normal sinus rhythm, no rubs Abdomen: Nontender nondistended good bowel sounds Musculoskeletal: No edema noted Skin: No rashes seen Neurologic: CN 2-12 grossly intact.  And nonfocal Psychiatric: Alert to name only.  Urine appears slightly dark and is catheter   Objective: Vitals:   09/15/20 1948 09/15/20 2258 09/16/20 0546 09/16/20 0725  BP: 121/89 (!) 124/92 114/80 135/79  Pulse: 97 93 97 (!) 106  Resp: 19 19 (!) 21 20  Temp: 97.6 F (36.4 C) 98.2 F (36.8 C) 97.8 F (36.6 C) 98.2 F (36.8 C)  TempSrc: Oral Oral Oral Oral  SpO2: 94% 100% 100% 97%  Weight:      Height:        Intake/Output Summary (Last 24 hours) at 09/16/2020 0742 Last data filed at 09/16/2020 0300 Gross per 24 hour  Intake 1538.31 ml  Output 225 ml  Net 1313.31 ml   Filed Weights   09/13/20 0400 09/14/20 0422 09/15/20 0357  Weight: (!) 141.7 kg (!) 142.6 kg (!) 140.2 kg     Data Reviewed:   CBC: Recent Labs  Lab 09/09/20 1336 09/09/20 1758 09/13/20 0323 09/13/20 1024 09/14/20 0426 09/14/20 0858 09/15/20 0428  WBC 10.6*   < > 11.2* 12.9* 16.8* 17.3* 19.5*  NEUTROABS 8.7*  --   --   --   --  16.1*  --   HGB 16.6   < > 13.5 13.6 13.3 13.4 13.1  HCT 48.3   < > 39.7 40.7 41.2 40.9 40.2  MCV 98.8    < > 101.5* 103.6* 103.0* 103.0* 104.1*  PLT PLATELET CLUMPS NOTED ON SMEAR, UNABLE TO ESTIMATE   < > 114* 102* 128* 122* 155   < > = values in this interval not displayed.   Basic Metabolic Panel: Recent Labs  Lab 09/11/20 0420 09/11/20 0420 09/12/20 0337 09/13/20 0323 09/14/20 0426 09/15/20 0428 09/16/20 0601  NA 136   < > 135 136 135 136 138  K 3.6   < > 3.7 4.9 4.1 4.3 4.6  CL 104   < > 103 101 101 101 102  CO2 21*   < > 21* 25 27 28 28   GLUCOSE 142*   < > 144* 163* 138* 172* 190*  BUN 32*   < > 20 15 17 19 20   CREATININE 1.53*   < > 1.07 1.06 1.11 1.17 1.18  CALCIUM 6.1*   < > 6.4* 7.2* 7.9* 8.2* 8.3*  MG 2.1   < > 2.1 2.2 2.3 2.4 2.3  PHOS 2.1*  --  2.2* 2.6 2.2* 1.8*  --    < > = values in this interval not displayed.   GFR: Estimated Creatinine Clearance: 103.1 mL/min (by C-G formula based on SCr  of 1.18 mg/dL). Liver Function Tests: Recent Labs  Lab 09/09/20 1336 09/09/20 1810 09/10/20 1407 09/10/20 1645 09/11/20 0420  AST 88* 83* QUESTIONABLE RESULTS, RECOMMEND RECOLLECT TO VERIFY 37 44*  ALT 35 34 QUESTIONABLE RESULTS, RECOMMEND RECOLLECT TO VERIFY 23 31  ALKPHOS 44 41 QUESTIONABLE RESULTS, RECOMMEND RECOLLECT TO VERIFY 35* 60  BILITOT 1.3* 1.2 QUESTIONABLE RESULTS, RECOMMEND RECOLLECT TO VERIFY 0.5 0.9  PROT 4.6* 4.6* QUESTIONABLE RESULTS, RECOMMEND RECOLLECT TO VERIFY 3.7* 5.3*  ALBUMIN 2.0* 2.0* QUESTIONABLE RESULTS, RECOMMEND RECOLLECT TO VERIFY 1.3* 1.9*   Recent Labs  Lab 09/09/20 1810  LIPASE 120*  AMYLASE 98   No results for input(s): AMMONIA in the last 168 hours. Coagulation Profile: Recent Labs  Lab 09/09/20 1810  INR 1.4*   Cardiac Enzymes: No results for input(s): CKTOTAL, CKMB, CKMBINDEX, TROPONINI in the last 168 hours. BNP (last 3 results) No results for input(s): PROBNP in the last 8760 hours. HbA1C: No results for input(s): HGBA1C in the last 72 hours. CBG: Recent Labs  Lab 09/15/20 1142 09/15/20 1727 09/15/20 2117  09/16/20 0019 09/16/20 0615  GLUCAP 137* 155* 112* 156* 192*   Lipid Profile: No results for input(s): CHOL, HDL, LDLCALC, TRIG, CHOLHDL, LDLDIRECT in the last 72 hours. Thyroid Function Tests: No results for input(s): TSH, T4TOTAL, FREET4, T3FREE, THYROIDAB in the last 72 hours. Anemia Panel: Recent Labs    09/15/20 0428  [id number] 300   Sepsis Labs: Recent Labs  Lab 09/09/20 1336 09/09/20 1640 09/09/20 1810 09/09/20 1942  PROCALCITON  --   --  0.70  --   LATICACIDVEN 3.2* 3.2*  --  2.9*    Recent Results (from the past 240 hour(s))  Urine Culture     Status: None   Collection Time: 09/09/20  4:40 PM   Specimen: Urine, Random  Result Value Ref Range Status   Specimen Description URINE, RANDOM  Final   Special Requests NONE  Final   Culture   Final    NO GROWTH Performed at [name], [place] [address], [place],  [address]    Report Status 09/10/2020 FINAL  Final  Respiratory Panel by RT PCR (Flu A&B, Covid) - Urine, Catheterized     Status: None   Collection Time: 09/09/20  4:40 PM   Specimen: Urine, Catheterized; Nasopharyngeal  Result Value Ref Range Status   SARS Coronavirus 2 by RT PCR NEGATIVE NEGATIVE Final    Comment: (NOTE) SARS-CoV-2 target nucleic acids are NOT DETECTED.  The SARS-CoV-2 RNA is generally detectable in upper respiratoy specimens during the acute phase of infection. The lowest concentration of SARS-CoV-2 viral copies this assay can detect is 131 copies/mL. A negative result does not preclude SARS-Cov-2 infection and should not be used as the sole basis for treatment or other patient management decisions. A negative result may occur with  improper specimen collection/handling, submission of specimen other than nasopharyngeal swab, presence of viral mutation(s) within the areas targeted by this assay, and inadequate number of viral copies (<131 copies/mL). A negative result must be combined with clinical observations,  patient history, and epidemiological information. The expected result is Negative.  Fact Sheet for Patients:  PinkCheek.be  Fact Sheet for Healthcare Providers:  GravelBags.it  This test is no t yet approved or cleared by the [place] FDA and  has been authorized for detection and/or diagnosis of SARS-CoV-2 by FDA under an Emergency Use Authorization (EUA). This EUA will remain  in effect (meaning this test can be used) for the  duration of the COVID-19 declaration under Section 564(b)(1) of the Act, 21 U.S.C. section 360bbb-3(b)(1), unless the authorization is terminated or revoked sooner.     Influenza A by PCR NEGATIVE NEGATIVE Final   Influenza B by PCR NEGATIVE NEGATIVE Final    Comment: (NOTE) The Xpert Xpress SARS-CoV-2/FLU/RSV assay is intended as an aid in  the diagnosis of influenza from Nasopharyngeal swab specimens and  should not be used as a sole basis for treatment. Nasal washings and  aspirates are unacceptable for Xpert Xpress SARS-CoV-2/FLU/RSV  testing.  Fact Sheet for Patients: PinkCheek.be  Fact Sheet for Healthcare Providers: GravelBags.it  This test is not yet approved or cleared by the [place] FDA and  has been authorized for detection and/or diagnosis of SARS-CoV-2 by  FDA under an Emergency Use Authorization (EUA). This EUA will remain  in effect (meaning this test can be used) for the duration of the  Covid-19 declaration under Section 564(b)(1) of the Act, 21  U.S.C. section 360bbb-3(b)(1), unless the authorization is  terminated or revoked. Performed at [facility], [place] [address], [place], [place] [address]   Culture, blood (routine x 2)     Status: None   Collection Time: 09/09/20  7:50 PM   Specimen: BLOOD LEFT ARM  Result Value Ref Range Status   Specimen Description BLOOD LEFT ARM  Final   Special Requests    Final    BOTTLES DRAWN AEROBIC AND ANAEROBIC Blood Culture adequate volume   Culture   Final    NO GROWTH 5 DAYS Performed at [facility], [place] [address] [place], [place] [address]    Report Status 09/14/2020 FINAL  Final  Culture, blood (routine x 2)     Status: None   Collection Time: 09/09/20  7:54 PM   Specimen: BLOOD RIGHT ARM  Result Value Ref Range Status   Specimen Description BLOOD RIGHT ARM  Final   Special Requests   Final    BOTTLES DRAWN AEROBIC AND ANAEROBIC Blood Culture adequate volume   Culture   Final    NO GROWTH 5 DAYS Performed at [facility], [place] [address] [place], [place] [address]    Report Status 09/14/2020 FINAL  Final  Urine culture     Status: None   Collection Time: 09/10/20 12:31 AM   Specimen: Urine, Random  Result Value Ref Range Status   Specimen Description URINE, RANDOM  Final   Special Requests NONE  Final   Culture   Final    NO GROWTH Performed at [name], [place] [address], [place], [place] [address]    Report Status 09/11/2020 FINAL  Final  MRSA PCR Screening     Status: None   Collection Time: 09/10/20  5:05 AM   Specimen: Nasopharyngeal  Result Value Ref Range Status   MRSA by PCR NEGATIVE NEGATIVE Final    Comment:        The GeneXpert MRSA Assay (FDA approved for NASAL specimens only), is one component of a comprehensive MRSA colonization surveillance program. It is not intended to diagnose MRSA infection nor to guide or monitor treatment for MRSA infections. Performed at [facility], [place] [address], [place], [place] [address]          Radiology Studies: DG Chest 2 View  Result Date: 09/14/2020 CLINICAL DATA:  Short of breath.  Progressive pleuritic pain EXAM: CHEST - 2 VIEW COMPARISON:  09/11/2020 FINDINGS: Progressive left lower lobe density  due to airspace disease and effusion. Right lung remains clear. Cardiac enlargement without heart failure. AICD again noted. IMPRESSION:  Progressive left lower lobe airspace disease and effusion. Possible pneumonia or fluid overload. Electronically Signed   By: Franchot Gallo M.D.   On: 09/14/2020 08:49         Scheduled Meds: . amiodarone  400 mg Oral BID  . apixaban  5 mg Oral BID  . atorvastatin  80 mg Oral Daily  . Chlorhexidine Gluconate Cloth  6 each Topical Daily  . diltiazem  120 mg Oral Daily  . docusate sodium  100 mg Oral BID  . insulin aspart  0-20 Units Subcutaneous TID WC  . insulin aspart  0-5 Units Subcutaneous QHS  . levETIRAcetam  500 mg Oral BID  . metoprolol tartrate  25 mg Oral BID  . pantoprazole  40 mg Oral QHS   Continuous Infusions: . sodium chloride 75 mL/hr at 09/16/20 0300     LOS: 7 days   Time spent= 35 mins    Tymon Nemetz Arsenio Loader, MD [facility]  If 7PM-7AM, please contact night-coverage  09/16/2020, 7:42 AM

## 2020-09-16 NOTE — Progress Notes (Signed)
Pt placed on CPAP 6 cmH2O  with Nasal mask (4L O2 bleed in) to rest. Pt tolerating well at this time. SPO2 100%.

## 2020-09-16 NOTE — Progress Notes (Signed)
SLP Cancellation Note  Patient Details Name: Gregory [name] MRN: [id number] DOB: 1961/09/07   Cancelled treatment:        Pt's daughter stated he just went to sleep and requested therapist not wake him. "Doctor says if he goes to sleep to let him rest." Will continue efforts tomorrow.    Houston Siren 09/16/2020, 3:16 PM   Orbie Pyo Colvin Caroli.Ed Risk analyst [phone] Office [phone]

## 2020-09-17 LAB — CBC
HCT: 37.1 % — ABNORMAL LOW (ref 39.0–52.0)
Hemoglobin: 11.8 g/dL — ABNORMAL LOW (ref 13.0–17.0)
MCH: 33.4 pg (ref 26.0–34.0)
MCHC: 31.8 g/dL (ref 30.0–36.0)
MCV: 105.1 fL — ABNORMAL HIGH (ref 80.0–100.0)
Platelets: 187 10*3/uL (ref 150–400)
RBC: 3.53 MIL/uL — ABNORMAL LOW (ref 4.22–5.81)
RDW: 18.5 % — ABNORMAL HIGH (ref 11.5–15.5)
WBC: 17.8 10*3/uL — ABNORMAL HIGH (ref 4.0–10.5)
nRBC: 0.4 % — ABNORMAL HIGH (ref 0.0–0.2)

## 2020-09-17 LAB — MAGNESIUM: Magnesium: 2.1 mg/dL (ref 1.7–2.4)

## 2020-09-17 LAB — GLUCOSE, CAPILLARY
Glucose-Capillary: 117 mg/dL — ABNORMAL HIGH (ref 70–99)
Glucose-Capillary: 123 mg/dL — ABNORMAL HIGH (ref 70–99)
Glucose-Capillary: 128 mg/dL — ABNORMAL HIGH (ref 70–99)
Glucose-Capillary: 141 mg/dL — ABNORMAL HIGH (ref 70–99)
Glucose-Capillary: 189 mg/dL — ABNORMAL HIGH (ref 70–99)
Glucose-Capillary: 220 mg/dL — ABNORMAL HIGH (ref 70–99)

## 2020-09-17 LAB — BASIC METABOLIC PANEL
Anion gap: 8 (ref 5–15)
BUN: 22 mg/dL (ref 8–23)
CO2: 29 mmol/L (ref 22–32)
Calcium: 8.2 mg/dL — ABNORMAL LOW (ref 8.9–10.3)
Chloride: 103 mmol/L (ref 98–111)
Creatinine, Ser: 1.1 mg/dL (ref 0.61–1.24)
GFR calc Af Amer: >60 mL/min (ref >60)
GFR calc non Af Amer: >60 mL/min (ref >60)
Glucose, Bld: 141 mg/dL — ABNORMAL HIGH (ref 70–99)
Potassium: 4.6 mmol/L (ref 3.5–5.1)
Sodium: 140 mmol/L (ref 135–145)

## 2020-09-17 LAB — PROTEIN, URINE, 24 HOUR
Collection Interval-UPROT: 24 hours
Protein, 24H Urine: 2016 mg/d — ABNORMAL HIGH (ref 50–100)
Protein, Urine: 144 mg/dL
Urine Total Volume-UPROT: 1400 mL

## 2020-09-17 MED ORDER — INFLUENZA VAC SPLIT QUAD 0.5 ML IM SUSY
0.5000 mL | PREFILLED_SYRINGE | INTRAMUSCULAR | Status: AC
  Administered 2020-09-18: 0.5 mL via INTRAMUSCULAR
  Filled 2020-09-17: qty 0.5

## 2020-09-17 MED ORDER — LEVETIRACETAM 500 MG PO TABS
500.0000 mg | ORAL_TABLET | Freq: Two times a day (BID) | ORAL | Status: DC
  Administered 2020-09-17 – 2020-09-24 (×14): 500 mg via ORAL
  Filled 2020-09-17 (×14): qty 1

## 2020-09-17 MED ORDER — ENOXAPARIN SODIUM 150 MG/ML ~~LOC~~ SOLN
140.0000 mg | Freq: Two times a day (BID) | SUBCUTANEOUS | Status: DC
  Administered 2020-09-17: 140 mg via SUBCUTANEOUS
  Filled 2020-09-17 (×2): qty 0.94

## 2020-09-17 MED ORDER — APIXABAN 5 MG PO TABS
5.0000 mg | ORAL_TABLET | Freq: Two times a day (BID) | ORAL | Status: DC
  Administered 2020-09-17 – 2020-09-22 (×10): 5 mg via ORAL
  Filled 2020-09-17 (×10): qty 1

## 2020-09-17 NOTE — Plan of Care (Signed)
  Problem: Cardiac: Goal: Ability to achieve and maintain adequate cardiopulmonary perfusion will improve Outcome: Progressing   Problem: Activity: Goal: Ability to tolerate increased activity will improve Outcome: Progressing   Problem: Pain Managment: Goal: General experience of comfort will improve Outcome: Progressing   Problem: Safety: Goal: Ability to remain free from injury will improve Outcome: Progressing   Problem: Coping: Goal: Level of anxiety will decrease Outcome: Progressing   Problem: Elimination: Goal: Will not experience complications related to bowel motility Outcome: Progressing   Problem: Activity: Goal: Risk for activity intolerance will decrease Outcome: Progressing   Problem: Clinical Measurements: Goal: Respiratory complications will improve Outcome: Progressing   Problem: Clinical Measurements: Goal: Will remain free from infection Outcome: Progressing

## 2020-09-17 NOTE — Progress Notes (Addendum)
Electrophysiology Rounding Note  Patient Name: Gregory [name] Date of Encounter: 09/17/2020  Primary Cardiologist: Sanda Sagan Wurzel, MD Electrophysiologist: Dr. Caryl Comes   Subjective   Awake and alert to person this am. Denies CP or SOB. Wants to eat. Remembers being confused and refusing meds yesterday.   Inpatient Medications    Scheduled Meds:  amiodarone  400 mg Oral BID   atorvastatin  80 mg Oral Daily   Chlorhexidine Gluconate Cloth  6 each Topical Daily   diltiazem  180 mg Oral Daily   docusate sodium  100 mg Oral BID   enoxaparin (LOVENOX) injection  150 mg Subcutaneous Q12H   insulin aspart  0-20 Units Subcutaneous TID WC   insulin aspart  0-5 Units Subcutaneous QHS   metoprolol tartrate  25 mg Oral BID   pantoprazole  40 mg Oral QHS   Continuous Infusions:  cefTRIAXone (ROCEPHIN)  IV Stopped (09/16/20 1157)   levETIRAcetam Stopped (09/16/20 2313)   PRN Meds: acetaminophen, metoprolol tartrate, polyethylene glycol, traZODone   Vital Signs    Vitals:   09/16/20 2117 09/16/20 2200 09/17/20 0003 09/17/20 0500  BP:  122/89 114/73 108/81  Pulse: (!) 103 72 94 94  Resp: 16 15 20 12   Temp:  98.2 F (36.8 C) 98.2 F (36.8 C) 97.9 F (36.6 C)  TempSrc:  Oral Axillary Oral  SpO2: 98% 99% 100% 100%  Weight:      Height:        Intake/Output Summary (Last 24 hours) at 09/17/2020 0708 Last data filed at 09/17/2020 0300 Gross per 24 hour  Intake 300 ml  Output 600 ml  Net -300 ml   Filed Weights   09/13/20 0400 09/14/20 0422 09/15/20 0357  Weight: (!) 141.7 kg (!) 142.6 kg (!) 140.2 kg    Physical Exam    GEN- The patient is well appearing, alert and oriented x 3 today.   Head- normocephalic, atraumatic Eyes-  Sclera clear, conjunctiva pink Ears- hearing intact Oropharynx- clear Neck- supple Lungs- Clear to ausculation bilaterally, normal work of breathing Heart- Regular rate and rhythm, no murmurs, rubs or gallops GI- soft, NT, ND, +  BS Extremities- no clubbing or cyanosis. No edema Skin- no rash or lesion Psych- euthymic mood, full affect Neuro- strength and sensation are intact  Labs    CBC Recent Labs    09/14/20 0858 09/14/20 0858 09/15/20 0428 09/16/20 0820  WBC 17.3*   < > 19.5* 24.5*  NEUTROABS 16.1*  --   --   --   HGB 13.4   < > 13.1 12.4*  HCT 40.9   < > 40.2 39.6  MCV 103.0*   < > 104.1* 105.6*  PLT 122*   < > 155 167   < > = values in this interval not displayed.   Basic Metabolic Panel Recent Labs    09/15/20 0428 09/16/20 0601  NA 136 138  K 4.3 4.6  CL 101 102  CO2 28 28  GLUCOSE 172* 190*  BUN 19 20  CREATININE 1.17 1.18  CALCIUM 8.2* 8.3*  MG 2.4 2.3  PHOS 1.8*  --    Liver Function Tests No results for input(s): AST, ALT, ALKPHOS, BILITOT, PROT, ALBUMIN in the last 72 hours. No results for input(s): LIPASE, AMYLASE in the last 72 hours. Cardiac Enzymes No results for input(s): CKTOTAL, CKMB, CKMBINDEX, TROPONINI in the last 72 hours.   Telemetry    AF with rates 100-130s (personally reviewed)  Radiology    CT  HEAD WO CONTRAST  Result Date: 09/16/2020 CLINICAL DATA:  Mental status change. EXAM: CT HEAD WITHOUT CONTRAST TECHNIQUE: Contiguous axial images were obtained from the base of the skull through the vertex without intravenous contrast. COMPARISON:  CT head 07/07/2017 FINDINGS: Brain: Subtle hypodensity and loss of gray-white differentiation involving the right insula and portions of the right frontal lobe. No evidence of acute hemorrhage, hydrocephalus, extra-axial collection or mass lesion/mass effect. Small hypodensity in the inferior left basal ganglia, similar to prior and likely a dilated perivascular space given location. Mild scattered white matter hypoattenuation, likely chronic microvascular ischemic change. Similar partially empty and expanded sella. Vascular: Calcific atherosclerosis. No hyperdense vessel identified. Skull: No acute fracture. Sinuses/Orbits:  Mild ethmoid air cell mucosal thickening. Otherwise, the sinuses are clear. Other: No mastoid effusions. IMPRESSION: Subtle hypodensity and loss of gray-white differentiation involving the right insula and portions of the right frontal lobe. While this could relate to streak artifact in this region, acute MCA territory infarct is not excluded. MRI could further evaluate if there is clinical concern. These results will be called to the ordering clinician or representative by the Radiologist Assistant, and communication documented in the PACS or [employer]. Electronically Signed   By: Margaretha Sheffield MD   On: 09/16/2020 10:58    Patient Profile     59 y.o. male w/PMHx of CAD (knonw chronic occl of RCA with prior PCIs to LAD, diag, and LCx), VT, ICM,  OSA w/CPAP, seizures, chronic CHF, HLD, HTN, obesity   admitted to [facility] 9/26 with progressive weakness, orthostatic dizziness, and a syncopal event, He was markedly hypotensive, started on IVF with agressive replacement as well a levophed, felt to be in shock (unceratin etiology septic vs hypovolemic) also noted to be in new AFib  Assessment & Plan    1. New Afib in setting of shock A/c with heparin gtt and amio for rate (would not expect conversion) Ordered for eliquis, but with encephalopathy yesterday this was held, given therapeutic dose lovenox. Amiodarone held as well No insurance, may be difficult to continue Eliquis long term. CHA2DS2Vasc is 3   2. Shock More confusion yesterday.  WBC continues to turned up, pending today.  Afebrile BC drawm 10/3 pending.  Off pressors  3. AKI Resolved.   4. CAD with prior PCI Echo 09/10/2020 LVEF 55-60% No s/s of ischemia at this time.   5. Hx of VT  Has MDT ICD Increased PVCs Preserved BiVe function by Echo   6. CHF Wearing CPAP Lungs clear.   7. Encephalopathy Confused yesterday. Refused most meds. Given lovenox.  CT head with subtle hypodensity and loss of gray-white matter in the  right insula and frontal lobe area. Thought possibly artifact versus acute MCA infarct. Not MRI candidate.   Will discuss plan and disposition with Dr. Caryl Comes  For questions or updates, please contact [facility] Please consult [email or web address] for contact info under Cardiology/STEMI.  Signed, Shirley Friar, PA-C  09/17/2020, 7:08 AM   Not seen byut discussed

## 2020-09-17 NOTE — Evaluation (Signed)
Clinical/Bedside Swallow Evaluation Patient Details  Name: Gregory [name] MRN: [id number] Date of Birth: 1961-07-17  Today's Date: 09/17/2020 Time: SLP Start Time (ACUTE ONLY): 49 SLP Stop Time (ACUTE ONLY): 1120 SLP Time Calculation (min) (ACUTE ONLY): 15 min  Past Medical History:  Past Medical History:  Diagnosis Date  . CAD (coronary artery disease) 12/01/2013  . Chronic combined systolic and diastolic CHF, NYHA class 1 ([place]) 12/01/2013  . Erectile dysfunction 12/01/2013  . HTN (hypertension) 12/01/2013  . Hyperlipidemia 12/01/2013  . Ischemic cardiomyopathy 12/01/2013  . Sleep apnea    Past Surgical History:  Past Surgical History:  Procedure Laterality Date  . ACHILLES TENDON REPAIR     lft foot  . CARDIAC CATHETERIZATION N/A 05/05/2016   Procedure: Left Heart Cath and Coronary Angiography;  Surgeon: Leonie Man, MD;  Location: [facility];  Service: Cardiovascular;  Laterality: N/A;  . CARDIAC DEFIBRILLATOR PLACEMENT    . CARDIOVERSION N/A 09/13/2020   Procedure: CARDIOVERSION;  Surgeon: Werner Lean, MD;  Location: [name];  Service: Cardiovascular;  Laterality: N/A;  . EP IMPLANTABLE DEVICE N/A 12/19/2016   Procedure: ICD Generator Changeout;  Surgeon: [name], MD;  Location: [place] [facility];  Service: Cardiovascular;  Laterality: N/A;  . WRIST TENODESIS     HPI:  59 year old with history of CAD, diastolic CHF EF 47%, ischemic cardiomyopathy, HTN, HLD, ischemic cardiomyopathy status post cardiac defibrillator admitted for nausea vomiting diarrhea, found to have AKI and hypotension requiring vasopressors.  Initially admitted to the ICU.  Infectious work-up was negative, echocardiogram showed EF 60%, normal LV, abdominal ultrasound showed cholelithiasis.  Also new onset A. fib likely and cardiology was consulted.  Initially attempted IV amiodarone, placed on IV heparin.  Cardioversion was planned but abandoned due to  anticoagulation issues.  While on heparin drip platelets significantly dropped with concerns of sepsis versus HIT.  HIT antibodies were sent.  EP team consulted.  Underwent cardioversion on 9/30 which was unsuccessful.  Currently loading with amiodarone with likely reattempt for cardioversion per EP.  During this time he had increasing encephalopathy and WBC, suspected UTI.  Bedside swallow eval ordered.     Assessment / Plan / Recommendation Clinical Impression  Pt presented with normal oropharyngeal swallow function.  He was alert with strong cough, normal phonation. Oral mechanism exam normal.  Consumed crackers, pudding, and three oz water, mixed solid/liquid consistencies with normal mastication, brisk swallow response, and no s/s of aspiration.  Recommend resuming a regular diet with thin liquids.  No dysphagia observed.  SLP will sign off.  SLP Visit Diagnosis: Dysphagia, unspecified (R13.10)    Aspiration Risk  No limitations    Diet Recommendation   regular solids, thin liquids  Medication Administration: Whole meds with liquid    Other  Recommendations Oral Care Recommendations: Oral care BID   Follow up Recommendations None      Frequency and Duration            Prognosis        Swallow Study   General Date of Onset: 09/09/20 HPI: 59 year old with history of CAD, diastolic CHF EF 42%, ischemic cardiomyopathy, HTN, HLD, ischemic cardiomyopathy status post cardiac defibrillator admitted for nausea vomiting diarrhea, found to have AKI and hypotension requiring vasopressors.  Initially admitted to the ICU.  Infectious work-up was negative, echocardiogram showed EF 60%, normal LV, abdominal ultrasound showed cholelithiasis.  Also new onset A. fib likely and cardiology was consulted.  Initially attempted IV amiodarone, placed  on IV heparin.  Cardioversion was planned but abandoned due to anticoagulation issues.  While on heparin drip platelets significantly dropped with concerns  of sepsis versus HIT.  HIT antibodies were sent.  EP team consulted.  Underwent cardioversion on 9/30 which was unsuccessful.  Currently loading with amiodarone with likely reattempt for cardioversion per EP.  During this time he had increasing encephalopathy and WBC, suspected UTI.  Bedside swallow eval ordered.   Type of Study: Bedside Swallow Evaluation Previous Swallow Assessment: no Diet Prior to this Study: NPO Temperature Spikes Noted: No Respiratory Status: Room air History of Recent Intubation: No Behavior/Cognition: Alert;Cooperative;Pleasant mood Oral Cavity Assessment: Within Functional Limits Oral Care Completed by SLP: No Oral Cavity - Dentition: Missing dentition Vision: Functional for self-feeding Self-Feeding Abilities: Able to feed self Patient Positioning: Upright in bed Baseline Vocal Quality: Normal Volitional Cough: Strong Volitional Swallow: Able to elicit    Oral/Motor/Sensory Function Overall Oral Motor/Sensory Function: Within functional limits   Ice Chips Ice chips: Within functional limits   Thin Liquid Thin Liquid: Within functional limits    Nectar Thick Nectar Thick Liquid: Not tested   Honey Thick Honey Thick Liquid: Not tested   Puree Puree: Within functional limits   Solid     Solid: Within functional limits      Juan Quam Laurice 09/17/2020,11:31 AM   Estill Bamberg L. Tivis Ringer, [place] Office number [phone] Pager [phone]

## 2020-09-17 NOTE — Progress Notes (Signed)
PROGRESS NOTE    Gregory [name]  JHE:[id number] DOB: Nov 13, 1961 DOA: 09/09/2020 PCP: Patient, No Pcp Per   Brief Narrative:  Patient is a 59 year old male with history of coronary artery disease, diastolic congestive heart failure, ischemic cardiomyopathy, hypertension, hyperlipidemia, status post cardiac defibrillator who initially presented with nausea, vomiting, diarrhea.  On presentation, he was found to have AKI, hypotension requiring vasopressors.  He was initially admitted to ICU.  Infectious work-up was negative.  Hospital course remarkable for new onset A. fib and cardiology was consulted.  Started on IV amiodarone, IV heparin.While on heparin drip, he developed thrombocytopenia with suspicion for HIT.  He underwent cardioversion on 9/30 which was unsuccessful.  EP team closely following.  Patient also developed confusion, leukocytosis during his hospitalization, UTI suspected and started on empiric antibiotics.  EP team closely following.  Assessment & Plan:   Principal Problem:   Atrial fibrillation with rapid ventricular response (HCC) Active Problems:   Chronic combined systolic and diastolic heart failure (HCC)   CAD S/P percutaneous coronary angioplasty   Essential hypertension   Ventricular tachycardia (HCC)   Seizure disorder (HCC)   CHF (congestive heart failure) (HCC)   Persistent atrial fibrillation (HCC)   Thrombocytopenia (HCC)   Shock (HCC)   A. fib with RVR: New onset.  Currently rate fluctuating between 90-120.  Cardiology/EP following.  Currently on amiodarone.  Unsuccessful cardioversion on 9/30.  Continue metoprolol, Cardizem.  TSH is normal. Eliquis started  Confusion/encephalopathy: Developed confusion during this hospitalization.  Suspected to be from urinary tract infection though urine culture did not show any growth.  On ceftriaxone.  Could be also from Ambien, delirium.  He denies any dysuria or lower abdominal discomfort.  We will continue  ceftriaxone for now. CT head was done which showed subtle hypodensity/loss of gray-white matter in the right insula/frontal lobe area posting possible artifact versus acute MCA infarct.  Patient unable to get MRI due to ICD.  Currently his mental status has improved and he is alert and oriented. He also had developed leukocytosis which is  improving.  Hypovolemic shock secondary to severe dehydration/nausea/vomiting/diarrhea: Resolved.  Cultures remain negative.  AKI: Resolved.  On presentation, his creatinine was in the range of 3.  Thrombocytopenia: Platelets level are improving.  HIT antibodies are negative/within normal range.  No evidence of bleeding  Hypercalcemia: Improved after given calcium gluconate  Severe coronary artery disease: Stable.  Denies any chest pain.  Status post ICD implantation.  Microcytic anemia: TSH and vitamin J85 normal.  Diastolic congestive heart failure: Last echo has shown ejection fraction of 55%.  He was dehydrated on presentation and was given IV fluids.  Obstructive  sleep apnea: On CPAP  Mild transaminitis: Ultrasound of the abdomen negative for cholelithiasis.  Denies any abdomen pain, there is no abdominal tenderness..  Continue to monitor.  History of seizures: On Keppra 500 mg twice a day.            DVT prophylaxis:Lovenox Code Status: Full Family Communication: Discussed with son at bedisde Status is: Inpatient  Remains inpatient appropriate because:Inpatient level of care appropriate due to severity of illness   Dispo: The patient is from: Home              Anticipated d/c is to: Home              Anticipated d/c date is: 2 days              Patient currently is not medically stable  to d/c.  Needs cardiology clearance before discharge.   Consultants: Cardiology  Procedures: Cardioversion  Antimicrobials:  Anti-infectives (From admission, onward)   Start     Dose/Rate Route Frequency Ordered Stop   09/16/20 1030   cefTRIAXone (ROCEPHIN) 2 g in sodium chloride 0.9 % 100 mL IVPB        2 g 200 mL/hr over 30 Minutes Intravenous Every 24 hours 09/16/20 0918 09/21/20 1029   09/10/20 1430  ceFEPIme (MAXIPIME) 2 g in sodium chloride 0.9 % 100 mL IVPB  Status:  Discontinued        2 g 200 mL/hr over 30 Minutes Intravenous Every 24 hours 09/09/20 1530 09/09/20 1725   09/09/20 1400  ceFEPIme (MAXIPIME) 2 g in sodium chloride 0.9 % 100 mL IVPB        2 g 200 mL/hr over 30 Minutes Intravenous  Once 09/09/20 1348 09/09/20 1516   09/09/20 1400  metroNIDAZOLE (FLAGYL) IVPB 500 mg        500 mg 100 mL/hr over 60 Minutes Intravenous  Once 09/09/20 1348 09/09/20 1609      Subjective:  Patient seen and examined at the bedside this afternoon..  Comfortable, during my evaluation, he was alert and oriented and answers all questions.  Son was also at bedside.  Denies any  complaints right now.  On A. fib with RVR.  Objective: Vitals:   09/17/20 0900 09/17/20 1142 09/17/20 1145 09/17/20 1300  BP: 105/82 (!) 150/108 123/88   Pulse: 89     Resp: 20  16 (!) 21  Temp: (!) 97.5 F (36.4 C)  98.5 F (36.9 C)   TempSrc: Oral  Oral   SpO2: 96%  98%   Weight:      Height:        Intake/Output Summary (Last 24 hours) at 09/17/2020 1403 Last data filed at 09/17/2020 4034 Gross per 24 hour  Intake 300 ml  Output 500 ml  Net -200 ml   Filed Weights   09/13/20 0400 09/14/20 0422 09/15/20 0357  Weight: (!) 141.7 kg (!) 142.6 kg (!) 140.2 kg    Examination:  General exam: Appears calm and comfortable ,Not in distress, morbidly obese HEENT:PERRL,Oral mucosa moist, Ear/Nose normal on gross exam Respiratory system: Bilateral equal air entry, normal vesicular breath sounds, no wheezes or crackles  Cardiovascular system: A. fib with RVR ,no JVD, murmurs, rubs, gallops or clicks. No pedal edema. Gastrointestinal system: Abdomen is distended, soft and nontender. No organomegaly or masses felt. Normal bowel sounds  heard. Central nervous system: Alert and oriented. No focal neurological deficits. Extremities: No edema, no clubbing ,no cyanosis Skin: No rashes, lesions or ulcers,no icterus ,no pallor   Data Reviewed: I have personally reviewed following labs and imaging studies  CBC: Recent Labs  Lab 09/14/20 0426 09/14/20 0858 09/15/20 0428 09/16/20 0820 09/17/20 0630  WBC 16.8* 17.3* 19.5* 24.5* 17.8*  NEUTROABS  --  16.1*  --   --   --   HGB 13.3 13.4 13.1 12.4* 11.8*  HCT 41.2 40.9 40.2 39.6 37.1*  MCV 103.0* 103.0* 104.1* 105.6* 105.1*  PLT 128* 122* 155 167 742   Basic Metabolic Panel: Recent Labs  Lab 09/11/20 0420 09/11/20 0420 09/12/20 0337 09/12/20 0337 09/13/20 0323 09/14/20 0426 09/15/20 0428 09/16/20 0601 09/17/20 0544  NA 136   < > 135   < > 136 135 136 138 140  K 3.6   < > 3.7   < > 4.9 4.1 4.3 4.6 4.6  CL 104   < > 103   < > 101 101 101 102 103  CO2 21*   < > 21*   < > 25 27 28 28 29   GLUCOSE 142*   < > 144*   < > 163* 138* 172* 190* 141*  BUN 32*   < > 20   < > 15 17 19 20 22   CREATININE 1.53*   < > 1.07   < > 1.06 1.11 1.17 1.18 1.10  CALCIUM 6.1*   < > 6.4*   < > 7.2* 7.9* 8.2* 8.3* 8.2*  MG 2.1   < > 2.1   < > 2.2 2.3 2.4 2.3 2.1  PHOS 2.1*  --  2.2*  --  2.6 2.2* 1.8*  --   --    < > = values in this interval not displayed.   GFR: Estimated Creatinine Clearance: 110.6 mL/min (by C-G formula based on SCr of 1.1 mg/dL). Liver Function Tests: Recent Labs  Lab 09/10/20 1407 09/10/20 1645 09/11/20 0420  AST QUESTIONABLE RESULTS, RECOMMEND RECOLLECT TO VERIFY 37 44*  ALT QUESTIONABLE RESULTS, RECOMMEND RECOLLECT TO VERIFY 23 31  ALKPHOS QUESTIONABLE RESULTS, RECOMMEND RECOLLECT TO VERIFY 35* 60  BILITOT QUESTIONABLE RESULTS, RECOMMEND RECOLLECT TO VERIFY 0.5 0.9  PROT QUESTIONABLE RESULTS, RECOMMEND RECOLLECT TO VERIFY 3.7* 5.3*  ALBUMIN QUESTIONABLE RESULTS, RECOMMEND RECOLLECT TO VERIFY 1.3* 1.9*   No results for input(s): LIPASE, AMYLASE in the last  168 hours. Recent Labs  Lab 09/16/20 0951  AMMONIA 17   Coagulation Profile: No results for input(s): INR, PROTIME in the last 168 hours. Cardiac Enzymes: No results for input(s): CKTOTAL, CKMB, CKMBINDEX, TROPONINI in the last 168 hours. BNP (last 3 results) No results for input(s): PROBNP in the last 8760 hours. HbA1C: No results for input(s): HGBA1C in the last 72 hours. CBG: Recent Labs  Lab 09/16/20 1952 09/17/20 0000 09/17/20 0536 09/17/20 0904 09/17/20 1145  GLUCAP 150* 128* 117* 141* 123*   Lipid Profile: No results for input(s): CHOL, HDL, LDLCALC, TRIG, CHOLHDL, LDLDIRECT in the last 72 hours. Thyroid Function Tests: No results for input(s): TSH, T4TOTAL, FREET4, T3FREE, THYROIDAB in the last 72 hours. Anemia Panel: Recent Labs    09/15/20 0428  VITAMINB12 683   Sepsis Labs: No results for input(s): PROCALCITON, LATICACIDVEN in the last 168 hours.  Recent Results (from the past 240 hour(s))  Urine Culture     Status: None   Collection Time: 09/09/20  4:40 PM   Specimen: Urine, Random  Result Value Ref Range Status   Specimen Description URINE, RANDOM  Final   Special Requests NONE  Final   Culture   Final    NO GROWTH Performed at [facility], [address] [place], [place] [address]    Report Status 09/10/2020 FINAL  Final  Respiratory Panel by RT PCR (Flu A&B, Covid) - Urine, Catheterized     Status: None   Collection Time: 09/09/20  4:40 PM   Specimen: Urine, Catheterized; Nasopharyngeal  Result Value Ref Range Status   SARS Coronavirus 2 by RT PCR NEGATIVE NEGATIVE Final    Comment: (NOTE) SARS-CoV-2 target nucleic acids are NOT DETECTED.  The SARS-CoV-2 RNA is generally detectable in upper respiratoy specimens during the acute phase of infection. The lowest concentration of SARS-CoV-2 viral copies this assay can detect is 131 copies/mL. A negative result does not preclude SARS-Cov-2 infection and should not be used as the sole basis  for treatment or other patient  management decisions. A negative result may occur with  improper specimen collection/handling, submission of specimen other than nasopharyngeal swab, presence of viral mutation(s) within the areas targeted by this assay, and inadequate number of viral copies (<131 copies/mL). A negative result must be combined with clinical observations, patient history, and epidemiological information. The expected result is Negative.  Fact Sheet for Patients:  PinkCheek.be  Fact Sheet for Healthcare Providers:  GravelBags.it  This test is no t yet approved or cleared by the [place] FDA and  has been authorized for detection and/or diagnosis of SARS-CoV-2 by FDA under an Emergency Use Authorization (EUA). This EUA will remain  in effect (meaning this test can be used) for the duration of the COVID-19 declaration under Section 564(b)(1) of the Act, 21 U.S.C. section 360bbb-3(b)(1), unless the authorization is terminated or revoked sooner.     Influenza A by PCR NEGATIVE NEGATIVE Final   Influenza B by PCR NEGATIVE NEGATIVE Final    Comment: (NOTE) The Xpert Xpress SARS-CoV-2/FLU/RSV assay is intended as an aid in  the diagnosis of influenza from Nasopharyngeal swab specimens and  should not be used as a sole basis for treatment. Nasal washings and  aspirates are unacceptable for Xpert Xpress SARS-CoV-2/FLU/RSV  testing.  Fact Sheet for Patients: PinkCheek.be  Fact Sheet for Healthcare Providers: GravelBags.it  This test is not yet approved or cleared by the [place] FDA and  has been authorized for detection and/or diagnosis of SARS-CoV-2 by  FDA under an Emergency Use Authorization (EUA). This EUA will remain  in effect (meaning this test can be used) for the duration of the  Covid-19 declaration under Section 564(b)(1) of the Act, 21   U.S.C. section 360bbb-3(b)(1), unless the authorization is  terminated or revoked. Performed at [facility], [place] [address] [place], [place] [address]   Culture, blood (routine x 2)     Status: None   Collection Time: 09/09/20  7:50 PM   Specimen: BLOOD LEFT ARM  Result Value Ref Range Status   Specimen Description BLOOD LEFT ARM  Final   Special Requests   Final    BOTTLES DRAWN AEROBIC AND ANAEROBIC Blood Culture adequate volume   Culture   Final    NO GROWTH 5 DAYS Performed at [facility], [place] [address], [place], [place] [address]    Report Status 09/14/2020 FINAL  Final  Culture, blood (routine x 2)     Status: None   Collection Time: 09/09/20  7:54 PM   Specimen: BLOOD RIGHT ARM  Result Value Ref Range Status   Specimen Description BLOOD RIGHT ARM  Final   Special Requests   Final    BOTTLES DRAWN AEROBIC AND ANAEROBIC Blood Culture adequate volume   Culture   Final    NO GROWTH 5 DAYS Performed at [facility], [place] [address], [place], [place] [address]    Report Status 09/14/2020 FINAL  Final  Urine culture     Status: None   Collection Time: 09/10/20 12:31 AM   Specimen: Urine, Random  Result Value Ref Range Status   Specimen Description URINE, RANDOM  Final   Special Requests NONE  Final   Culture   Final    NO GROWTH Performed at [name], [place] [address] [place], [place] [address]    Report Status 09/11/2020 FINAL  Final  MRSA PCR Screening     Status: None   Collection Time: 09/10/20  5:05 AM  Specimen: Nasopharyngeal  Result Value Ref Range Status   MRSA by PCR NEGATIVE NEGATIVE Final    Comment:        The GeneXpert MRSA Assay (FDA approved for NASAL specimens only), is one component of a comprehensive MRSA colonization surveillance program. It is not intended to diagnose MRSA infection nor to guide or monitor treatment for MRSA infections. Performed at [facility], [place] [address] [place], [place]  [address]   Culture, blood (Routine X 2) w Reflex to ID Panel     Status: None (Preliminary result)   Collection Time: 09/16/20 10:10 AM   Specimen: BLOOD RIGHT HAND  Result Value Ref Range Status   Specimen Description BLOOD RIGHT HAND  Final   Special Requests   Final    BOTTLES DRAWN AEROBIC ONLY Blood Culture results may not be optimal due to an inadequate volume of blood received in culture bottles   Culture   Final    NO GROWTH 1 DAY Performed at [facility], [place] [name] [place],  [address]    Report Status PENDING  Incomplete  Culture, blood (Routine X 2) w Reflex to ID Panel     Status: None (Preliminary result)   Collection Time: 09/16/20 10:10 AM   Specimen: BLOOD RIGHT HAND  Result Value Ref Range Status   Specimen Description BLOOD RIGHT HAND  Final   Special Requests   Final    BOTTLES DRAWN AEROBIC ONLY Blood Culture results may not be optimal due to an inadequate volume of blood received in culture bottles   Culture   Final    NO GROWTH 1 DAY Performed at [facility], [place] [address] [place],  [address]    Report Status PENDING  Incomplete         Radiology Studies: CT HEAD WO CONTRAST  Result Date: 09/16/2020 CLINICAL DATA:  Mental status change. EXAM: CT HEAD WITHOUT CONTRAST TECHNIQUE: Contiguous axial images were obtained from the base of the skull through the vertex without intravenous contrast. COMPARISON:  CT head 07/07/2017 FINDINGS: Brain: Subtle hypodensity and loss of gray-white differentiation involving the right insula and portions of the right frontal lobe. No evidence of acute hemorrhage, hydrocephalus, extra-axial collection or mass lesion/mass effect. Small hypodensity in the inferior left basal ganglia, similar to prior and likely a dilated perivascular space given location. Mild scattered white matter hypoattenuation, likely chronic microvascular ischemic change. Similar partially empty and expanded sella. Vascular:  Calcific atherosclerosis. No hyperdense vessel identified. Skull: No acute fracture. Sinuses/Orbits: Mild ethmoid air cell mucosal thickening. Otherwise, the sinuses are clear. Other: No mastoid effusions. IMPRESSION: Subtle hypodensity and loss of gray-white differentiation involving the right insula and portions of the right frontal lobe. While this could relate to streak artifact in this region, acute MCA territory infarct is not excluded. MRI could further evaluate if there is clinical concern. These results will be called to the ordering clinician or representative by the Radiologist Assistant, and communication documented in the PACS or [employer]. Electronically Signed   By: Margaretha Sheffield MD   On: 09/16/2020 10:58        Scheduled Meds: . amiodarone  400 mg Oral BID  . atorvastatin  80 mg Oral Daily  . Chlorhexidine Gluconate Cloth  6 each Topical Daily  . diltiazem  180 mg Oral Daily  . docusate sodium  100 mg Oral BID  . enoxaparin (LOVENOX) injection  140 mg Subcutaneous Q12H  . [START ON  09/18/2020] influenza vac split quadrivalent PF  0.5 mL Intramuscular Tomorrow-1000  . insulin aspart  0-20 Units Subcutaneous TID WC  . insulin aspart  0-5 Units Subcutaneous QHS  . metoprolol tartrate  25 mg Oral BID  . pantoprazole  40 mg Oral QHS   Continuous Infusions: . cefTRIAXone (ROCEPHIN)  IV 2 g (09/17/20 1141)  . levETIRAcetam 500 mg (09/17/20 1154)     LOS: 8 days    Time spent:35 mins. More than 50% of that time was spent in counseling and/or coordination of care.      Shelly Coss, MD [facility] P10/03/2020, 2:03 PM

## 2020-09-17 NOTE — Progress Notes (Signed)
Mobility Specialist - Progress Note   09/17/20 1406  Mobility  Activity Stood at bedside  Level of Assistance Minimal assist, patient does 75% or more  Assistive Device Front wheel walker  Mobility Response Tolerated fair  Mobility performed by Mobility specialist;Nurse  $Mobility charge 1 Mobility    Pre-mobility: 101 HR, 118/62 BP During mobility: 142 HR Post-mobility: 104 HR, 117/78 BP  Pt stood w/ +2 min assist for safety. He was able to stand twice for roughly 15 seconds each time before having to sit down due to weakness in his knees. Pt was then guided through leg exercises and advised to continue doing exercises on his own while laying in the bed. He denied any lightheadedness, dizziness, or SOB when standing. Pt expressed he is very discouraged about his loss of strength.   Pricilla Handler Mobility Specialist Mobility Specialist Phone: [phone]

## 2020-09-18 LAB — BASIC METABOLIC PANEL
Anion gap: 7 (ref 5–15)
BUN: 21 mg/dL (ref 8–23)
CO2: 28 mmol/L (ref 22–32)
Calcium: 7.8 mg/dL — ABNORMAL LOW (ref 8.9–10.3)
Chloride: 100 mmol/L (ref 98–111)
Creatinine, Ser: 1 mg/dL (ref 0.61–1.24)
GFR calc Af Amer: >60 mL/min (ref >60)
GFR calc non Af Amer: >60 mL/min (ref >60)
Glucose, Bld: 197 mg/dL — ABNORMAL HIGH (ref 70–99)
Potassium: 4.1 mmol/L (ref 3.5–5.1)
Sodium: 135 mmol/L (ref 135–145)

## 2020-09-18 LAB — CBC WITH DIFFERENTIAL/PLATELET
Abs Immature Granulocytes: 0 10*3/uL (ref 0.00–0.07)
Basophils Absolute: 0 10*3/uL (ref 0.0–0.1)
Basophils Relative: 0 %
Eosinophils Absolute: 0 10*3/uL (ref 0.0–0.5)
Eosinophils Relative: 0 %
HCT: 34.2 % — ABNORMAL LOW (ref 39.0–52.0)
Hemoglobin: 11.1 g/dL — ABNORMAL LOW (ref 13.0–17.0)
Lymphocytes Relative: 3 %
Lymphs Abs: 0.4 10*3/uL — ABNORMAL LOW (ref 0.7–4.0)
MCH: 33.7 pg (ref 26.0–34.0)
MCHC: 32.5 g/dL (ref 30.0–36.0)
MCV: 104 fL — ABNORMAL HIGH (ref 80.0–100.0)
Monocytes Absolute: 0.4 10*3/uL (ref 0.1–1.0)
Monocytes Relative: 3 %
Neutro Abs: 13.5 10*3/uL — ABNORMAL HIGH (ref 1.7–7.7)
Neutrophils Relative %: 94 %
Platelets: 203 10*3/uL (ref 150–400)
RBC: 3.29 MIL/uL — ABNORMAL LOW (ref 4.22–5.81)
RDW: 18.4 % — ABNORMAL HIGH (ref 11.5–15.5)
WBC: 14.4 10*3/uL — ABNORMAL HIGH (ref 4.0–10.5)
nRBC: 0.9 % — ABNORMAL HIGH (ref 0.0–0.2)
nRBC: 3 /100 WBC — ABNORMAL HIGH

## 2020-09-18 LAB — GLUCOSE, CAPILLARY
Glucose-Capillary: 182 mg/dL — ABNORMAL HIGH (ref 70–99)
Glucose-Capillary: 195 mg/dL — ABNORMAL HIGH (ref 70–99)
Glucose-Capillary: 212 mg/dL — ABNORMAL HIGH (ref 70–99)
Glucose-Capillary: 214 mg/dL — ABNORMAL HIGH (ref 70–99)
Glucose-Capillary: 215 mg/dL — ABNORMAL HIGH (ref 70–99)
Glucose-Capillary: 227 mg/dL — ABNORMAL HIGH (ref 70–99)

## 2020-09-18 LAB — MAGNESIUM: Magnesium: 2 mg/dL (ref 1.7–2.4)

## 2020-09-18 LAB — FOLATE RBC
Folate, Hemolysate: 294 ng/mL
Folate, RBC: 784 ng/mL (ref >498)
Hematocrit: 37.5 % (ref 37.5–51.0)

## 2020-09-18 MED ORDER — CEPHALEXIN 500 MG PO CAPS
500.0000 mg | ORAL_CAPSULE | Freq: Three times a day (TID) | ORAL | Status: AC
  Administered 2020-09-19 – 2020-09-20 (×6): 500 mg via ORAL
  Filled 2020-09-18 (×6): qty 1

## 2020-09-18 MED ORDER — POLYETHYLENE GLYCOL 3350 17 G PO PACK
17.0000 g | PACK | Freq: Every day | ORAL | Status: DC
  Administered 2020-09-19 – 2020-09-29 (×2): 17 g via ORAL
  Filled 2020-09-18 (×8): qty 1

## 2020-09-18 NOTE — Progress Notes (Addendum)
Electrophysiology Rounding Note  Patient Name: Gregory [name] Date of Encounter: 09/18/2020  Primary Cardiologist: Sanda Dyanna Seiter, MD Electrophysiologist: Dr. Caryl Comes   Subjective   Alert and oriented this am. Did much better yesterday. He says he is feeling alright. Mild SOB overnight reported by RN, but pt denies this am.   Inpatient Medications    Scheduled Meds:  amiodarone  400 mg Oral BID   apixaban  5 mg Oral BID   atorvastatin  80 mg Oral Daily   Chlorhexidine Gluconate Cloth  6 each Topical Daily   diltiazem  180 mg Oral Daily   docusate sodium  100 mg Oral BID   influenza vac split quadrivalent PF  0.5 mL Intramuscular Tomorrow-1000   insulin aspart  0-20 Units Subcutaneous TID WC   insulin aspart  0-5 Units Subcutaneous QHS   levETIRAcetam  500 mg Oral BID   metoprolol tartrate  25 mg Oral BID   pantoprazole  40 mg Oral QHS   Continuous Infusions:  cefTRIAXone (ROCEPHIN)  IV 2 g (09/17/20 1141)   PRN Meds: acetaminophen, metoprolol tartrate, polyethylene glycol, traZODone   Vital Signs    Vitals:   09/17/20 2300 09/18/20 0013 09/18/20 0603 09/18/20 0631  BP:  113/77 102/76   Pulse: 83 92 100 91  Resp: 18 17 20 15   Temp:  97.7 F (36.5 C) 98.3 F (36.8 C)   TempSrc:  Oral Axillary   SpO2: 100% 99% 92% 91%  Weight:    (!) 144.9 kg  Height:        Intake/Output Summary (Last 24 hours) at 09/18/2020 0724 Last data filed at 09/17/2020 2200 Gross per 24 hour  Intake 200 ml  Output 800 ml  Net -600 ml   Filed Weights   09/14/20 0422 09/15/20 0357 09/18/20 0631  Weight: (!) 142.6 kg (!) 140.2 kg (!) 144.9 kg    Physical Exam    GEN- The patient is well appearing, alert and oriented x 3 today.   Head- normocephalic, atraumatic Eyes-  Sclera clear, conjunctiva pink Ears- hearing intact Oropharynx- clear Neck- supple Lungs- Clear to ausculation bilaterally, normal work of breathing Heart- Irregularly irregular, no murmurs, rubs or  gallops GI- soft, NT, ND, + BS Extremities- no clubbing or cyanosis. No edema Skin- no rash or lesion Psych- euthymic mood, full affect Neuro- strength and sensation are intact  Labs    CBC Recent Labs    09/17/20 0630 09/18/20 0500  WBC 17.8* 14.4*  NEUTROABS  --  PENDING  HGB 11.8* 11.1*  HCT 37.1* 34.2*  MCV 105.1* 104.0*  PLT 187 284   Basic Metabolic Panel Recent Labs    09/17/20 0544 09/18/20 0500  NA 140 135  K 4.6 4.1  CL 103 100  CO2 29 28  GLUCOSE 141* 197*  BUN 22 21  CREATININE 1.10 1.00  CALCIUM 8.2* 7.8*  MG 2.1 2.0   Liver Function Tests No results for input(s): AST, ALT, ALKPHOS, BILITOT, PROT, ALBUMIN in the last 72 hours. No results for input(s): LIPASE, AMYLASE in the last 72 hours. Cardiac Enzymes No results for input(s): CKTOTAL, CKMB, CKMBINDEX, TROPONINI in the last 72 hours.   Telemetry    AF with improved rates, 80-110s (personally reviewed)  Radiology    CT HEAD WO CONTRAST  Result Date: 09/16/2020 CLINICAL DATA:  Mental status change. EXAM: CT HEAD WITHOUT CONTRAST TECHNIQUE: Contiguous axial images were obtained from the base of the skull through the vertex without intravenous contrast. COMPARISON:  CT head 07/07/2017 FINDINGS: Brain: Subtle hypodensity and loss of gray-white differentiation involving the right insula and portions of the right frontal lobe. No evidence of acute hemorrhage, hydrocephalus, extra-axial collection or mass lesion/mass effect. Small hypodensity in the inferior left basal ganglia, similar to prior and likely a dilated perivascular space given location. Mild scattered white matter hypoattenuation, likely chronic microvascular ischemic change. Similar partially empty and expanded sella. Vascular: Calcific atherosclerosis. No hyperdense vessel identified. Skull: No acute fracture. Sinuses/Orbits: Mild ethmoid air cell mucosal thickening. Otherwise, the sinuses are clear. Other: No mastoid effusions. IMPRESSION:  Subtle hypodensity and loss of gray-white differentiation involving the right insula and portions of the right frontal lobe. While this could relate to streak artifact in this region, acute MCA territory infarct is not excluded. MRI could further evaluate if there is clinical concern. These results will be called to the ordering clinician or representative by the Radiologist Assistant, and communication documented in the PACS or [employer]. Electronically Signed   By: Margaretha Sheffield MD   On: 09/16/2020 10:58    Patient Profile     59 y.o. male w/PMHx of CAD (knonw chronic occl of RCA with prior PCIs to LAD, diag, and LCx), VT, ICM,  OSA w/CPAP, seizures, chronic CHF, HLD, HTN, obesity   admitted to [facility] 9/26 with progressive weakness, orthostatic dizziness, and a syncopal event, He was markedly hypotensive, started on IVF with agressive replacement as well a levophed, felt to be in shock (unceratin etiology septic vs hypovolemic) also noted to be in new AFib  Assessment & Plan    1. New Afib in setting of shock Rates improved now taking po meds Received amiodarone, dilt, and eliquis yesterday. He was covered by lovenox for 1 day with refusal of po No insurance, may be difficult to continue Eliquis long term. CHA2DS2Vasc is 3. Will discuss further with Dr. Caryl Comes. Will discuss timing of possible DCCV with Dr. Caryl Comes.    2. Shock Afebrile.  WBC trending down now on rocephin BC drawn 10/3 NGTD Off pressors   3. AKI Resolved   4. CAD with prior PCI Echo 09/10/2020 LVEF 55-60% Denies s/s ischemia at this time.    5. Hx of VT  Has MDT ICD Increased PVCs Preserved EF Echo   6. CHF Volume status looks OK on exam.  Echo 09/10/2020 LVEF 55-60%   7. Encephalopathy Confused 09/16/2020 Refused most meds. Given lovenox.  CT head with subtle hypodensity and loss of gray-white matter in the right insula and frontal lobe area. Thought possibly artifact versus acute MCA infarct. Not MRI  candidate.  Today he is alert and oriented.  Discussed with Dr. Caryl Comes. We will attempt [facility] tomorrow by Dr. Caryl Comes.  For questions or updates, please contact [place] Please consult [email or web address] for contact info under Cardiology/STEMI.  Signed, Shirley Friar, PA-C  09/18/2020, 7:24 AM    As above     For DCCV in am for persistent afib  Better  WBC down, on abx-- is this the trigger of the initial shock  Is 2 gms of protein in the urine enough to result in albumin of <2??  He is also complaining of weakness  appreciate PT input

## 2020-09-18 NOTE — Progress Notes (Addendum)
PROGRESS NOTE    Coby Shrewsberry  OIN:[id number] DOB: 1961-10-12 DOA: 09/09/2020 PCP: Patient, No Pcp Per   Brief Narrative:  Patient is a 59 year old male with history of coronary artery disease, diastolic congestive heart failure, ischemic cardiomyopathy, hypertension, hyperlipidemia, status post cardiac defibrillator who initially presented with nausea, vomiting, diarrhea.  On presentation, he was found to have AKI, hypotension requiring vasopressors.  He was initially admitted to ICU.  Infectious work-up was negative.  Hospital course remarkable for new onset A. fib and cardiology was consulted.  Started on IV amiodarone, IV heparin.While on heparin drip, he developed thrombocytopenia with suspicion for HIT.  He underwent cardioversion on 9/30 which was unsuccessful.  EP team closely following.  Patient also developed confusion, leukocytosis during his hospitalization, UTI suspected and started on empiric antibiotics.  EP team closely following and planning for repeat DCCV tomorrow.  Assessment & Plan:   Principal Problem:   Atrial fibrillation with rapid ventricular response (HCC) Active Problems:   Chronic combined systolic and diastolic heart failure (HCC)   CAD S/P percutaneous coronary angioplasty   Essential hypertension   Ventricular tachycardia (HCC)   Seizure disorder (HCC)   CHF (congestive heart failure) (HCC)   Persistent atrial fibrillation (HCC)   Thrombocytopenia (HCC)   Shock (HCC)   A. fib with RVR: New onset.  Currently rate fluctuating between 90-120.  Cardiology/EP following.  Currently on amiodarone.  Unsuccessful cardioversion on 9/30.  Continue metoprolol, Cardizem.  TSH is normal. Eliquis restarted.  Plan for another DCCV tomorrow by EP.  Confusion/encephalopathy: Developed confusion during this hospitalization.  Suspected to be from urinary tract infection though urine culture did not show any growth. Started on ceftriaxone,will be change to oral.   Could be also from Ambien, delirium.  He denies any dysuria or lower abdominal discomfort.  CT head was done which showed subtle hypodensity/loss of gray-white matter in the right insula/frontal lobe area posting possible artifact versus acute MCA infarct.  Patient unable to get MRI due to ICD.  Currently his mental status has improved and he is alert and oriented.  He does not have any focal neurological deficits. He also had developed leukocytosis which is  improving.  Hypovolemic shock secondary to severe dehydration/nausea/vomiting/diarrhea: Resolved.  Cultures remain negative.  AKI: Resolved.  On presentation, his creatinine was in the range of 3.  Thrombocytopenia: Platelets level are improving.  HIT antibodies are negative/within normal range.  No evidence of bleeding  Hypocalcemia: Improved after given calcium gluconate  Severe coronary artery disease: Stable.  Denies any chest pain.  Status post ICD implantation.  Macrocytic anemia: TSH and vitamin B09 normal.  Diastolic congestive heart failure: Last echo has shown ejection fraction of 55%.  He was dehydrated on presentation and was given IV fluids.  Obstructive  sleep apnea: On CPAP  Mild transaminitis: Ultrasound of the abdomen negative for cholelithiasis.  Denies any abdomen pain, there is no abdominal tenderness.  Continue to monitor.  History of seizures: On Keppra 500 mg twice a day.  Debility/deconditioning: Patient complains of generalized weakness.  Requested PT/OT evaluation.            DVT prophylaxis:Lovenox Code Status: Full Family Communication: Discussed with son at bedside on 09/17/20 Status is: Inpatient  Remains inpatient appropriate because:Inpatient level of care appropriate due to severity of illness   Dispo: The patient is from: Home              Anticipated d/c is to: Home  Anticipated d/c date is: 2 days              Patient currently is not medically stable to d/c.  Needs  cardiology clearance before discharge.  Waiting for DCCV   Consultants: Cardiology  Procedures: Cardioversion  Antimicrobials:  Anti-infectives (From admission, onward)   Start     Dose/Rate Route Frequency Ordered Stop   09/16/20 1030  cefTRIAXone (ROCEPHIN) 2 g in sodium chloride 0.9 % 100 mL IVPB        2 g 200 mL/hr over 30 Minutes Intravenous Every 24 hours 09/16/20 0918 09/21/20 1029   09/10/20 1430  ceFEPIme (MAXIPIME) 2 g in sodium chloride 0.9 % 100 mL IVPB  Status:  Discontinued        2 g 200 mL/hr over 30 Minutes Intravenous Every 24 hours 09/09/20 1530 09/09/20 1725   09/09/20 1400  ceFEPIme (MAXIPIME) 2 g in sodium chloride 0.9 % 100 mL IVPB        2 g 200 mL/hr over 30 Minutes Intravenous  Once 09/09/20 1348 09/09/20 1516   09/09/20 1400  metroNIDAZOLE (FLAGYL) IVPB 500 mg        500 mg 100 mL/hr over 60 Minutes Intravenous  Once 09/09/20 1348 09/09/20 1609      Subjective:  Patient seen and examined at the bedside this morning.  During my evaluation, he was comfortable, alert and oriented.  Complains of generalized weakness.  He was on 2 L of oxygen per minute that was put at night.  His lungs were clear on auscultation so I asked the nurse to monitor him on room air.  Objective: Vitals:   09/17/20 2300 09/18/20 0013 09/18/20 0603 09/18/20 0631  BP:  113/77 102/76   Pulse: 83 92 100 91  Resp: 18 17 20 15   Temp:  97.7 F (36.5 C) 98.3 F (36.8 C)   TempSrc:  Oral Axillary   SpO2: 100% 99% 92% 91%  Weight:    (!) 144.9 kg  Height:        Intake/Output Summary (Last 24 hours) at 09/18/2020 0740 Last data filed at 09/17/2020 2200 Gross per 24 hour  Intake 200 ml  Output 800 ml  Net -600 ml   Filed Weights   09/14/20 0422 09/15/20 0357 09/18/20 0631  Weight: (!) 142.6 kg (!) 140.2 kg (!) 144.9 kg    Examination:  General exam: Appears calm and comfortable ,Not in distress, morbidly obese  HEENT:PERRL,Oral mucosa moist, Ear/Nose normal on gross exam,  peripheral lesions on the bilateral eyes most likely pterygium Respiratory system: Mildly diminished air entry on bilaterally on basis, no wheezes or crackles  cardiovascular system: Irregularly irregular rhythm, no JVD, murmurs, rubs, gallops or clicks. Gastrointestinal system: Abdomen is distended, soft and nontender. No organomegaly or masses felt. Normal bowel sounds heard. Central nervous system: Alert and oriented. No focal neurological deficits. Extremities: No edema, no clubbing ,no cyanosis Skin: No rashes, lesions or ulcers,no icterus ,no pallor    Data Reviewed: I have personally reviewed following labs and imaging studies  CBC: Recent Labs  Lab 09/14/20 0858 09/15/20 0428 09/16/20 0820 09/17/20 0630 09/18/20 0500  WBC 17.3* 19.5* 24.5* 17.8* 14.4*  NEUTROABS 16.1*  --   --   --  PENDING  HGB 13.4 13.1 12.4* 11.8* 11.1*  HCT 40.9 40.2 39.6 37.1* 34.2*  MCV 103.0* 104.1* 105.6* 105.1* 104.0*  PLT 122* 155 167 187 417   Basic Metabolic Panel: Recent Labs  Lab 09/12/20 0337 09/12/20 4081  09/13/20 4098 09/13/20 1191 09/14/20 0426 09/15/20 0428 09/16/20 0601 09/17/20 0544 09/18/20 0500  NA 135   < > 136   < > 135 136 138 140 135  K 3.7   < > 4.9   < > 4.1 4.3 4.6 4.6 4.1  CL 103   < > 101   < > 101 101 102 103 100  CO2 21*   < > 25   < > 27 28 28 29 28   GLUCOSE 144*   < > 163*   < > 138* 172* 190* 141* 197*  BUN 20   < > 15   < > 17 19 20 22 21   CREATININE 1.07   < > 1.06   < > 1.11 1.17 1.18 1.10 1.00  CALCIUM 6.4*   < > 7.2*   < > 7.9* 8.2* 8.3* 8.2* 7.8*  MG 2.1   < > 2.2   < > 2.3 2.4 2.3 2.1 2.0  PHOS 2.2*  --  2.6  --  2.2* 1.8*  --   --   --    < > = values in this interval not displayed.   GFR: Estimated Creatinine Clearance: 123.8 mL/min (by C-G formula based on SCr of 1 mg/dL). Liver Function Tests: No results for input(s): AST, ALT, ALKPHOS, BILITOT, PROT, ALBUMIN in the last 168 hours. No results for input(s): LIPASE, AMYLASE in the last 168  hours. Recent Labs  Lab 09/16/20 0951  AMMONIA 17   Coagulation Profile: No results for input(s): INR, PROTIME in the last 168 hours. Cardiac Enzymes: No results for input(s): CKTOTAL, CKMB, CKMBINDEX, TROPONINI in the last 168 hours. BNP (last 3 results) No results for input(s): PROBNP in the last 8760 hours. HbA1C: No results for input(s): HGBA1C in the last 72 hours. CBG: Recent Labs  Lab 09/17/20 1145 09/17/20 1715 09/17/20 2037 09/18/20 0024 09/18/20 0414  GLUCAP 123* 189* 220* 227* 182*   Lipid Profile: No results for input(s): CHOL, HDL, LDLCALC, TRIG, CHOLHDL, LDLDIRECT in the last 72 hours. Thyroid Function Tests: No results for input(s): TSH, T4TOTAL, FREET4, T3FREE, THYROIDAB in the last 72 hours. Anemia Panel: No results for input(s): VITAMINB12, FOLATE, FERRITIN, TIBC, IRON, RETICCTPCT in the last 72 hours. Sepsis Labs: No results for input(s): PROCALCITON, LATICACIDVEN in the last 168 hours.  Recent Results (from the past 240 hour(s))  Urine Culture     Status: None   Collection Time: 09/09/20  4:40 PM   Specimen: Urine, Random  Result Value Ref Range Status   Specimen Description URINE, RANDOM  Final   Special Requests NONE  Final   Culture   Final    NO GROWTH Performed at [facility], [address], [place], [place] [address]    Report Status 09/10/2020 FINAL  Final  Respiratory Panel by RT PCR (Flu A&B, Covid) - Urine, Catheterized     Status: None   Collection Time: 09/09/20  4:40 PM   Specimen: Urine, Catheterized; Nasopharyngeal  Result Value Ref Range Status   SARS Coronavirus 2 by RT PCR NEGATIVE NEGATIVE Final    Comment: (NOTE) SARS-CoV-2 target nucleic acids are NOT DETECTED.  The SARS-CoV-2 RNA is generally detectable in upper respiratoy specimens during the acute phase of infection. The lowest concentration of SARS-CoV-2 viral copies this assay can detect is 131 copies/mL. A negative result does not preclude  SARS-Cov-2 infection and should not be used as the sole basis for treatment or other patient management decisions. A  negative result may occur with  improper specimen collection/handling, submission of specimen other than nasopharyngeal swab, presence of viral mutation(s) within the areas targeted by this assay, and inadequate number of viral copies (<131 copies/mL). A negative result must be combined with clinical observations, patient history, and epidemiological information. The expected result is Negative.  Fact Sheet for Patients:  PinkCheek.be  Fact Sheet for Healthcare Providers:  GravelBags.it  This test is no t yet approved or cleared by the [place] FDA and  has been authorized for detection and/or diagnosis of SARS-CoV-2 by FDA under an Emergency Use Authorization (EUA). This EUA will remain  in effect (meaning this test can be used) for the duration of the COVID-19 declaration under Section 564(b)(1) of the Act, 21 U.S.C. section 360bbb-3(b)(1), unless the authorization is terminated or revoked sooner.     Influenza A by PCR NEGATIVE NEGATIVE Final   Influenza B by PCR NEGATIVE NEGATIVE Final    Comment: (NOTE) The Xpert Xpress SARS-CoV-2/FLU/RSV assay is intended as an aid in  the diagnosis of influenza from Nasopharyngeal swab specimens and  should not be used as a sole basis for treatment. Nasal washings and  aspirates are unacceptable for Xpert Xpress SARS-CoV-2/FLU/RSV  testing.  Fact Sheet for Patients: PinkCheek.be  Fact Sheet for Healthcare Providers: GravelBags.it  This test is not yet approved or cleared by the [place] FDA and  has been authorized for detection and/or diagnosis of SARS-CoV-2 by  FDA under an Emergency Use Authorization (EUA). This EUA will remain  in effect (meaning this test can be used) for the duration of the   Covid-19 declaration under Section 564(b)(1) of the Act, 21  U.S.C. section 360bbb-3(b)(1), unless the authorization is  terminated or revoked. Performed at [facility], [place] [address] [place], [place] [address]   Culture, blood (routine x 2)     Status: None   Collection Time: 09/09/20  7:50 PM   Specimen: BLOOD LEFT ARM  Result Value Ref Range Status   Specimen Description BLOOD LEFT ARM  Final   Special Requests   Final    BOTTLES DRAWN AEROBIC AND ANAEROBIC Blood Culture adequate volume   Culture   Final    NO GROWTH 5 DAYS Performed at [facility], [place] [address] [place], [place] [address]    Report Status 09/14/2020 FINAL  Final  Culture, blood (routine x 2)     Status: None   Collection Time: 09/09/20  7:54 PM   Specimen: BLOOD RIGHT ARM  Result Value Ref Range Status   Specimen Description BLOOD RIGHT ARM  Final   Special Requests   Final    BOTTLES DRAWN AEROBIC AND ANAEROBIC Blood Culture adequate volume   Culture   Final    NO GROWTH 5 DAYS Performed at [facility], [place] [address] [place], [place] [address]    Report Status 09/14/2020 FINAL  Final  Urine culture     Status: None   Collection Time: 09/10/20 12:31 AM   Specimen: Urine, Random  Result Value Ref Range Status   Specimen Description URINE, RANDOM  Final   Special Requests NONE  Final   Culture   Final    NO GROWTH Performed at [facility], [place] [address] [place],  [address]    Report Status 09/11/2020 FINAL  Final  MRSA PCR Screening     Status: None   Collection Time: 09/10/20  5:05 AM   Specimen: Nasopharyngeal  Result Value Ref Range Status   MRSA by PCR NEGATIVE NEGATIVE Final    Comment:        The GeneXpert MRSA Assay (FDA approved for NASAL specimens only), is one component of a comprehensive MRSA colonization surveillance program. It is not intended to diagnose MRSA infection nor to guide or monitor treatment for MRSA infections. Performed  at [facility], [place] [address], [place], [place] [address]   Culture, blood (Routine X 2) w Reflex to ID Panel     Status: None (Preliminary result)   Collection Time: 09/16/20 10:10 AM   Specimen: BLOOD RIGHT HAND  Result Value Ref Range Status   Specimen Description BLOOD RIGHT HAND  Final   Special Requests   Final    BOTTLES DRAWN AEROBIC ONLY Blood Culture results may not be optimal due to an inadequate volume of blood received in culture bottles   Culture   Final    NO GROWTH 1 DAY Performed at [facility], [place] [address] [place], [place] [address]    Report Status PENDING  Incomplete  Culture, blood (Routine X 2) w Reflex to ID Panel     Status: None (Preliminary result)   Collection Time: 09/16/20 10:10 AM   Specimen: BLOOD RIGHT HAND  Result Value Ref Range Status   Specimen Description BLOOD RIGHT HAND  Final   Special Requests   Final    BOTTLES DRAWN AEROBIC ONLY Blood Culture results may not be optimal due to an inadequate volume of blood received in culture bottles   Culture   Final    NO GROWTH 1 DAY Performed at [facility], [place] [address] [place], [place] [address]    Report Status PENDING  Incomplete         Radiology Studies: CT HEAD WO CONTRAST  Result Date: 09/16/2020 CLINICAL DATA:  Mental status change. EXAM: CT HEAD WITHOUT CONTRAST TECHNIQUE: Contiguous axial images were obtained from the base of the skull through the vertex without intravenous contrast. COMPARISON:  CT head 07/07/2017 FINDINGS: Brain: Subtle hypodensity and loss of gray-white differentiation involving the right insula and portions of the right frontal lobe. No evidence of acute hemorrhage, hydrocephalus, extra-axial collection or mass lesion/mass effect. Small hypodensity in the inferior left basal ganglia, similar to prior and likely a dilated perivascular space given location. Mild scattered white matter hypoattenuation, likely chronic microvascular ischemic  change. Similar partially empty and expanded sella. Vascular: Calcific atherosclerosis. No hyperdense vessel identified. Skull: No acute fracture. Sinuses/Orbits: Mild ethmoid air cell mucosal thickening. Otherwise, the sinuses are clear. Other: No mastoid effusions. IMPRESSION: Subtle hypodensity and loss of gray-white differentiation involving the right insula and portions of the right frontal lobe. While this could relate to streak artifact in this region, acute MCA territory infarct is not excluded. MRI could further evaluate if there is clinical concern. These results will be called to the ordering clinician or representative by the Radiologist Assistant, and communication documented in the PACS or [employer]. Electronically Signed   By: Margaretha Sheffield MD   On: 09/16/2020 10:58        Scheduled Meds: . amiodarone  400 mg Oral BID  . apixaban  5 mg Oral BID  . atorvastatin  80 mg Oral Daily  . Chlorhexidine Gluconate Cloth  6 each Topical Daily  . diltiazem  180 mg Oral Daily  . docusate sodium  100 mg Oral BID  . influenza vac split quadrivalent PF  0.5  mL Intramuscular Tomorrow-1000  . insulin aspart  0-20 Units Subcutaneous TID WC  . insulin aspart  0-5 Units Subcutaneous QHS  . levETIRAcetam  500 mg Oral BID  . metoprolol tartrate  25 mg Oral BID  . pantoprazole  40 mg Oral QHS   Continuous Infusions: . cefTRIAXone (ROCEPHIN)  IV 2 g (09/17/20 1141)     LOS: 9 days    Time spent:35 mins. More than 50% of that time was spent in counseling and/or coordination of care.      Shelly Coss, MD [facility] P10/04/2020, 7:40 AM

## 2020-09-18 NOTE — Progress Notes (Signed)
Mobility Specialist - Progress Note   09/18/20 1601  Mobility  Activity Stood at bedside  Level of Assistance Standby assist, set-up cues, supervision of patient - no hands on  Assistive Device Front wheel walker  Mobility Response Tolerated poorly  Mobility performed by Mobility specialist  $Mobility charge 1 Mobility     Pre-mobility:  1L O2: 90 HR, 98% SpO2  RA: 98% SpO2 During mobility: 115 HR, 80% SpO2 Post-mobility, 1 L O2: 91 HR, 100% SpO2  Pt desat to 80% after standing for ~15 seconds, he endorsed feeling out of breath. After a sitting rest break, he attempted to stand a second time, but said his thighs felt like they were too weak. Pt back in bed and encouraged continued exercises in bed.   Pricilla Handler Mobility Specialist Mobility Specialist Phone: [phone]

## 2020-09-19 ENCOUNTER — Inpatient Hospital Stay (HOSPITAL_COMMUNITY): Payer: Worker's Compensation | Admitting: Certified Registered Nurse Anesthetist

## 2020-09-19 ENCOUNTER — Encounter (HOSPITAL_COMMUNITY)
Admission: EM | Disposition: A | Discharge status: Rehabilitation Facility | Payer: Self-pay | Source: Home / Self Care | Attending: Internal Medicine

## 2020-09-19 ENCOUNTER — Encounter (HOSPITAL_COMMUNITY): Payer: Self-pay | Admitting: Internal Medicine

## 2020-09-19 ENCOUNTER — Other Ambulatory Visit: Payer: Self-pay

## 2020-09-19 DIAGNOSIS — E871 Hypo-osmolality and hyponatremia: Secondary | ICD-10-CM

## 2020-09-19 DIAGNOSIS — Z9861 Coronary angioplasty status: Secondary | ICD-10-CM

## 2020-09-19 DIAGNOSIS — I10 Essential (primary) hypertension: Secondary | ICD-10-CM

## 2020-09-19 DIAGNOSIS — I4891 Unspecified atrial fibrillation: Secondary | ICD-10-CM

## 2020-09-19 HISTORY — PX: CARDIOVERSION: SHX1299

## 2020-09-19 LAB — CBC WITH DIFFERENTIAL/PLATELET
Abs Immature Granulocytes: 0.09 10*3/uL — ABNORMAL HIGH (ref 0.00–0.07)
Basophils Absolute: 0 10*3/uL (ref 0.0–0.1)
Basophils Relative: 0 %
Eosinophils Absolute: 0 10*3/uL (ref 0.0–0.5)
Eosinophils Relative: 0 %
HCT: 32.9 % — ABNORMAL LOW (ref 39.0–52.0)
Hemoglobin: 10.8 g/dL — ABNORMAL LOW (ref 13.0–17.0)
Immature Granulocytes: 1 %
Lymphocytes Relative: 6 %
Lymphs Abs: 0.7 10*3/uL (ref 0.7–4.0)
MCH: 34.2 pg — ABNORMAL HIGH (ref 26.0–34.0)
MCHC: 32.8 g/dL (ref 30.0–36.0)
MCV: 104.1 fL — ABNORMAL HIGH (ref 80.0–100.0)
Monocytes Absolute: 0.6 10*3/uL (ref 0.1–1.0)
Monocytes Relative: 5 %
Neutro Abs: 10.6 10*3/uL — ABNORMAL HIGH (ref 1.7–7.7)
Neutrophils Relative %: 88 %
Platelets: 199 10*3/uL (ref 150–400)
RBC: 3.16 MIL/uL — ABNORMAL LOW (ref 4.22–5.81)
RDW: 18.4 % — ABNORMAL HIGH (ref 11.5–15.5)
WBC: 12.4 10*3/uL — ABNORMAL HIGH (ref 4.0–10.5)
nRBC: 0.7 % — ABNORMAL HIGH (ref 0.0–0.2)

## 2020-09-19 LAB — BASIC METABOLIC PANEL
Anion gap: 8 (ref 5–15)
BUN: 14 mg/dL (ref 8–23)
CO2: 28 mmol/L (ref 22–32)
Calcium: 7.8 mg/dL — ABNORMAL LOW (ref 8.9–10.3)
Chloride: 99 mmol/L (ref 98–111)
Creatinine, Ser: 0.94 mg/dL (ref 0.61–1.24)
GFR calc non Af Amer: >60 mL/min (ref >60)
Glucose, Bld: 185 mg/dL — ABNORMAL HIGH (ref 70–99)
Potassium: 4.6 mmol/L (ref 3.5–5.1)
Sodium: 135 mmol/L (ref 135–145)

## 2020-09-19 LAB — IRON AND TIBC
Iron: 27 ug/dL — ABNORMAL LOW (ref 45–182)
Saturation Ratios: 18 % (ref 17.9–39.5)
TIBC: 153 ug/dL — ABNORMAL LOW (ref 250–450)
UIBC: 126 ug/dL

## 2020-09-19 LAB — GLUCOSE, CAPILLARY
Glucose-Capillary: 174 mg/dL — ABNORMAL HIGH (ref 70–99)
Glucose-Capillary: 185 mg/dL — ABNORMAL HIGH (ref 70–99)
Glucose-Capillary: 171 mg/dL — ABNORMAL HIGH (ref 70–99)
Glucose-Capillary: 236 mg/dL — ABNORMAL HIGH (ref 70–99)
Glucose-Capillary: 237 mg/dL — ABNORMAL HIGH (ref 70–99)

## 2020-09-19 LAB — RETICULOCYTES
Immature Retic Fract: 28.7 % — ABNORMAL HIGH (ref 2.3–15.9)
RBC.: 3.29 MIL/uL — ABNORMAL LOW (ref 4.22–5.81)
Retic Count, Absolute: 67.8 10*3/uL (ref 19.0–186.0)
Retic Ct Pct: 2.1 % (ref 0.4–3.1)

## 2020-09-19 LAB — PROTIME-INR
INR: 1.5 — ABNORMAL HIGH (ref 0.8–1.2)
Prothrombin Time: 17.6 seconds — ABNORMAL HIGH (ref 11.4–15.2)

## 2020-09-19 LAB — FERRITIN: Ferritin: 1062 ng/mL — ABNORMAL HIGH (ref 24–336)

## 2020-09-19 SURGERY — CARDIOVERSION
Anesthesia: General

## 2020-09-19 MED ORDER — SODIUM CHLORIDE 0.9 % IV SOLN: INTRAVENOUS | Status: DC | PRN

## 2020-09-19 MED ORDER — PROPOFOL 10 MG/ML IV BOLUS
INTRAVENOUS | Status: DC | PRN
  Administered 2020-09-19: 10 mg via INTRAVENOUS
  Administered 2020-09-19: 60 mg via INTRAVENOUS

## 2020-09-19 MED ORDER — LIDOCAINE 2% (20 MG/ML) 5 ML SYRINGE
INTRAMUSCULAR | Status: DC | PRN
  Administered 2020-09-19: 80 mg via INTRAVENOUS

## 2020-09-19 MED ORDER — SODIUM CHLORIDE 0.9 % IV SOLN
INTRAVENOUS | Status: AC | PRN
  Administered 2020-09-19: 500 mL via INTRAVENOUS

## 2020-09-19 NOTE — Evaluation (Signed)
Occupational Therapy Evaluation Patient Details Name: Gregory [name] MRN: [id number] DOB: [birth year] Today's Date: 09/19/2020    History of Present Illness Gregory [name] is an 59 y.o. male past medical history of coronary artery disease, chronic diastolic heart failure, ischemic cardiomyopathy essential hypertension status post cardiac defibrillation who initially presented to the ED with nausea vomiting and diarrhea was found to be in acute kidney injury requiring vasopressors initially admitted to the ICU infectious work-up turned out to be negative.  He then developed A. fib with RVR cardiology was consulted.Procedure  DC Cardioversion 10/6.   Clinical Impression   Patient admitted with the above diagnosis.  LB weakness, R thigh pain, decreased stand tolerance and balance are impacting his ADL status, functional mobility and toilet independence.  At his prior level, he needed no assist with self care, mobility, drove himself, able to complete all home management and meal prep, and cared for meds.  He lives with his son, who is working from home.  At present he is up to mod A for LB ADL, and basic mobility.  VSS throughout assessment.  OT to follow in the acute setting.  CIR has been recommended.      Follow Up Recommendations  CIR    Equipment Recommendations  Tub/shower seat    Recommendations for Other Services Rehab consult     Precautions / Restrictions        Mobility Bed Mobility Overal bed mobility: Needs Assistance       Supine to sit: Mod assist;HOB elevated        Transfers                 General transfer comment: declined stand and to recliner    Balance   Sitting-balance support: No upper extremity supported;Feet supported Sitting balance-Leahy Scale: Fair                                     ADL either performed or assessed with clinical judgement   ADL Overall ADL's : Needs  assistance/impaired Eating/Feeding: Independent;Sitting   Grooming: Set up;Wash/dry face;Oral care;Wash/dry hands;Sitting   Upper Body Bathing: Set up;Sitting   Lower Body Bathing: Moderate assistance;Bed level Lower Body Bathing Details (indicate cue type and reason): long sitting.  Patient is not able to let go of the RW for LB ADL Upper Body Dressing : Set up;Sitting   Lower Body Dressing: Moderate assistance;Sitting/lateral leans               Functional mobility during ADLs: Moderate assistance General ADL Comments: Patient had just returned to bed and declined back to recliner.  Able to sit EOB.  Mod A     Vision                    Pertinent Vitals/Pain Faces Pain Scale: Hurts a little bit Pain Location: R thigh Pain Descriptors / Indicators: Burning Pain Intervention(s): Monitored during session     Hand Dominance Right   Extremity/Trunk Assessment Upper Extremity Assessment Upper Extremity Assessment: Overall WFL for tasks assessed   Lower Extremity Assessment Lower Extremity Assessment: Defer to PT evaluation LLE Sensation: decreased light touch       Communication Communication Communication: No difficulties   Cognition Arousal/Alertness: Awake/alert Behavior During Therapy: WFL for tasks assessed/performed Overall Cognitive Status: Within Functional Limits for tasks assessed  Home Living Family/patient expects to be discharged to:: Private residence Living Arrangements: Children Available Help at Discharge: Family;Available 24 hours/day Type of Home: House Home Access: Level entry     Home Layout: Multi-level Alternate Level Stairs-Number of Steps: 10 Alternate Level Stairs-Rails: Right Bathroom Shower/Tub: Teacher, early years/pre: Standard     Home Equipment: None          Prior Functioning/Environment Level of Independence: Independent         Comments: pt reports independent and driving, no assist with anything.        OT Problem List: Decreased strength;Decreased activity tolerance;Impaired balance (sitting and/or standing);Pain;Impaired sensation;Increased edema      OT Treatment/Interventions: Self-care/ADL training;Therapeutic exercise;Neuromuscular education;Therapeutic activities;Patient/family education;Balance training    OT Goals(Current goals can be found in the care plan section) Acute Rehab OT Goals Patient Stated Goal: Needs to strengthen my legs, wants to walk again. OT Goal Formulation: With patient Time For Goal Achievement: 10/03/20 Potential to Achieve Goals: Good ADL Goals Pt Will Perform Lower Body Bathing: with supervision;sit to/from stand Pt Will Perform Lower Body Dressing: with supervision;sit to/from stand Pt Will Transfer to Toilet: with modified independence;ambulating Pt Will Perform Toileting - Clothing Manipulation and hygiene: with modified independence;sit to/from stand  OT Frequency: Min 2X/week   Barriers to D/C: Decreased caregiver support                        AM-PAC OT "6 Clicks" Daily Activity     Outcome Measure Help from another person eating meals?: None Help from another person taking care of personal grooming?: None Help from another person toileting, which includes using toliet, bedpan, or urinal?: A Lot Help from another person bathing (including washing, rinsing, drying)?: A Lot Help from another person to put on and taking off regular upper body clothing?: A Little Help from another person to put on and taking off regular lower body clothing?: A Lot 6 Click Score: 17   End of Session    Activity Tolerance: Patient tolerated treatment well Patient left: in bed;with call bell/phone within reach;with nursing/sitter in room  OT Visit Diagnosis: Unsteadiness on feet (R26.81);Other abnormalities of gait and mobility (R26.89);Muscle weakness (generalized)  (M62.81);Pain Pain - Right/Left: Right Pain - part of body: Leg                Time: 4235-3614 OT Time Calculation (min): 17 min Charges:  OT General Charges $OT Visit: 1 Visit OT Evaluation $OT Eval Moderate Complexity: 1 Mod  09/19/2020  Rich, OTR/L  Acute Rehabilitation Services  Office:  [place] 09/19/2020, 2:30 PM

## 2020-09-19 NOTE — Anesthesia Preprocedure Evaluation (Signed)
Anesthesia Evaluation  Patient identified by MRN, date of birth, ID band Patient awake    Reviewed: Allergy & Precautions, NPO status , Patient's Chart, lab work & pertinent test results, reviewed documented beta blocker date and time   Airway Mallampati: III  TM Distance: >3 FB Neck ROM: Full    Dental  (+) Poor Dentition, Missing   Pulmonary sleep apnea and Continuous Positive Airway Pressure Ventilation , former smoker,    Pulmonary exam normal breath sounds clear to auscultation       Cardiovascular hypertension, Pt. on home beta blockers and Pt. on medications + CAD and +CHF  + dysrhythmias Atrial Fibrillation  Rhythm:Irregular Rate:Abnormal     Neuro/Psych Seizures -,     GI/Hepatic Neg liver ROS, GERD  Medicated,  Endo/Other  negative endocrine ROSObesity   Renal/GU negative Renal ROS     Musculoskeletal negative musculoskeletal ROS (+)   Abdominal   Peds  Hematology  (+) Blood dyscrasia (Plavix), anemia ,   Anesthesia Other Findings Day of surgery medications reviewed with the patient.  Reproductive/Obstetrics                             Anesthesia Physical Anesthesia Plan  ASA: III  Anesthesia Plan: General   Post-op Pain Management:    Induction: Intravenous  PONV Risk Score and Plan: 2 and Propofol infusion and Treatment may vary due to age or medical condition  Airway Management Planned: Mask  Additional Equipment:   Intra-op Plan:   Post-operative Plan:   Informed Consent: I have reviewed the patients History and Physical, chart, labs and discussed the procedure including the risks, benefits and alternatives for the proposed anesthesia with the patient or authorized representative who has indicated his/her understanding and acceptance.       Plan Discussed with:   Anesthesia Plan Comments:         Anesthesia Quick Evaluation

## 2020-09-19 NOTE — Progress Notes (Signed)
[facility] PROGRESS NOTE    Progress Note  Gregory [name]  LZJ:[id number] DOB: 02-16-1961 DOA: 09/09/2020 PCP: Patient, No Pcp Per     Brief Narrative:   Gregory [name] is an 59 y.o. male past medical history of coronary artery disease, chronic diastolic heart failure, ischemic cardiomyopathy essential hypertension status post cardiac defibrillation who initially presented to the ED with nausea vomiting and diarrhea was found to be in acute kidney injury requiring vasopressors initially admitted to the ICU infectious work-up turned out to be negative.  He then developed A. 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 EP was consulted and recommended DCCV on 09/19/2020. Patient did develop acute confusion, leukocytosis and was suspected to be in sepsis due to UTI started empirically on antibiotics.  Assessment/Plan:   Atrial fibrillation with rapid ventricular response (HCC) Rate currently ranging from 90-1 20. Currently on IV amiodarone, metoprolol and Cardizem he had unsuccessful DC cardioversion. He is on Eliquis.  EP recommended DCCV on 09/19/2020.  Acute confusional state/acute encephalopathy: Likely due to infectious etiology.  CT unremarkable unable to do MRI due to AICD. His mental status has been improving slowly has been neurologically intact.  Severe sepsis due to UTI: He was started on IV Rocephin his encephalopathy resolved. Culture data has remained negative he was transitioned to oral Keflex, will complete antibiotic regimen of 5 days. Culture data has remained negative till date.  Acute kidney injury: Likely prerenal in the setting of A. fib with RVR with a baseline less than 1. Resolved with IV fluid hydration.    Thrombocytopenia: After starting heparin head bodies  were negative there is no evidence of bleeding after stopping heparin his platelets are improving.  Hypocalcemia: Was giving calcium gluconate now resolved.  Coronary artery disease: Status post AICD, denies any chest pain or shortness of breath.  Macrocytic anemia: Folate of 300, B12 was 683. Check an anemia panel.  Chronic combined systolic and diastolic heart failure (HCC) Appears euvolemic on physical exam KVO IV fluids last echo showed an EF of 55%.  His risk of sleep apnea: Continue CPAP at night.  Mild transaminitis: Abdominal ultrasound was negative for acute cholecystitis he denies any abdominal pain. Likely cardiovascular mediated now resolved.  Seizures: Continue Keppra.   DVT prophylaxis: Eliquis Family Communication:none Status is: Inpatient  Remains inpatient appropriate because:Hemodynamically unstable   Dispo: The patient is from: Home              Anticipated d/c is to: Home              Anticipated d/c date is: 2 days              Patient currently is not medically stable to d/c.        Code Status:     Code Status Orders  (From admission, onward)         Start     Ordered   09/09/20 1642  Full code  Continuous        09/09/20 1643        Code Status History    Date Active Date Inactive Code Status Order ID Comments User Context   06/07/2018 2227 06/09/2018 1730 Full Code [id number]  Jani Gravel, MD Inpatient   12/19/2016 1651 12/19/2016 2113 Full Code [id number]  [name], MD Inpatient   05/04/2016 2100  05/07/2016 2158 Full Code [id number]  Jerline Pain, MD Inpatient   Advance Care Planning Activity        IV Access:    Peripheral IV   Procedures and diagnostic studies:   No results found.   Medical Consultants:    None.  Anti-Infectives:   keflex  Subjective:    Gregory [name] denies any chest pain or shortness of breath he relates he is hungry and tired of being in the hospital.  Objective:     Vitals:   09/18/20 2000 09/18/20 2141 09/18/20 2310 09/19/20 0413  BP: 121/76  119/73 126/86  Pulse: 73 89 86 65  Resp: 19 17 19 15   Temp: 98.1 F (36.7 C)  98.3 F (36.8 C) 97.8 F (36.6 C)  TempSrc: Oral  Oral Oral  SpO2: 98% 100% 98% 100%  Weight:    (!) 143.4 kg  Height:       SpO2: 100 % O2 Flow Rate (L/min): 2 L/min   Intake/Output Summary (Last 24 hours) at 09/19/2020 0826 Last data filed at 09/19/2020 0526 Gross per 24 hour  Intake 562 ml  Output 1675 ml  Net -1113 ml   Filed Weights   09/15/20 0357 09/18/20 0631 09/19/20 0413  Weight: (!) 140.2 kg (!) 144.9 kg (!) 143.4 kg    Exam: General exam: In no acute distress. Respiratory system: Good air movement and clear to auscultation. Cardiovascular system: S1 & S2 heard, RRR. No JVD. Gastrointestinal system: Abdomen is nondistended, soft and nontender.  Central nervous system: Alert and oriented. No focal neurological deficits. Extremities: No pedal edema. Skin: No rashes, lesions or ulcers Psychiatry: Judgement and insight appear normal. Mood & affect appropriate.    Data Reviewed:    Labs: Basic Metabolic Panel: Recent Labs  Lab 09/13/20 0323 09/13/20 0323 09/14/20 6270 09/14/20 3500 09/15/20 9381 09/15/20 8299 09/16/20 0601 09/16/20 0601 09/17/20 0544 09/17/20 0544 09/18/20 0500 09/19/20 0415  NA 136   < > 135   < > 136  --  138  --  140  --  135 135  K 4.9   < > 4.1   < > 4.3   < > 4.6   < > 4.6   < > 4.1 4.6  CL 101   < > 101   < > 101  --  102  --  103  --  100 99  CO2 25   < > 27   < > 28  --  28  --  29  --  28 28  GLUCOSE 163*   < > 138*   < > 172*  --  190*  --  141*  --  197* 185*  BUN 15   < > 17   < > 19  --  20  --  22  --  21 14  CREATININE 1.06   < > 1.11   < > 1.17  --  1.18  --  1.10  --  1.00 0.94  CALCIUM 7.2*   < > 7.9*   < > 8.2*  --  8.3*  --  8.2*  --  7.8* 7.8*  MG 2.2   < > 2.3  --  2.4  --  2.3  --  2.1  --  2.0  --   PHOS 2.6  --  2.2*  --  1.8*  --   --   --    --   --   --   --    < > =  values in this interval not displayed.   GFR Estimated Creatinine Clearance: 131 mL/min (by C-G formula based on SCr of 0.94 mg/dL). Liver Function Tests: No results for input(s): AST, ALT, ALKPHOS, BILITOT, PROT, ALBUMIN in the last 168 hours. No results for input(s): LIPASE, AMYLASE in the last 168 hours. Recent Labs  Lab 09/16/20 0951  AMMONIA 17   Coagulation profile Recent Labs  Lab 09/19/20 0546  INR 1.5*   COVID-19 Labs  No results for input(s): DDIMER, FERRITIN, LDH, CRP in the last 72 hours.  Lab Results  Component Value Date   [place] NEGATIVE 09/09/2020   [place] Not Detected 12/05/2019    CBC: Recent Labs  Lab 09/14/20 0858 09/14/20 0858 09/15/20 0428 09/16/20 0820 09/17/20 0630 09/18/20 0500 09/19/20 0415  WBC 17.3*   < > 19.5* 24.5* 17.8* 14.4* 12.4*  NEUTROABS 16.1*  --   --   --   --  13.5* 10.6*  HGB 13.4   < > 13.1 12.4* 11.8* 11.1* 10.8*  HCT 40.9   < > 40.2  37.5 39.6 37.1* 34.2* 32.9*  MCV 103.0*   < > 104.1* 105.6* 105.1* 104.0* 104.1*  PLT 122*   < > 155 167 187 203 199   < > = values in this interval not displayed.   Cardiac Enzymes: No results for input(s): CKTOTAL, CKMB, CKMBINDEX, TROPONINI in the last 168 hours. BNP (last 3 results) No results for input(s): PROBNP in the last 8760 hours. CBG: Recent Labs  Lab 09/18/20 1656 09/18/20 2007 09/18/20 2355 09/19/20 0408 09/19/20 0806  GLUCAP 214* 215* 212* 174* 185*   D-Dimer: No results for input(s): DDIMER in the last 72 hours. Hgb A1c: No results for input(s): HGBA1C in the last 72 hours. Lipid Profile: No results for input(s): CHOL, HDL, LDLCALC, TRIG, CHOLHDL, LDLDIRECT in the last 72 hours. Thyroid function studies: No results for input(s): TSH, T4TOTAL, T3FREE, THYROIDAB in the last 72 hours.  Invalid input(s): FREET3 Anemia work up: No results for input(s): VITAMINB12, FOLATE, FERRITIN, TIBC, IRON, RETICCTPCT in the last 72  hours. Sepsis Labs: Recent Labs  Lab 09/16/20 0820 09/17/20 0630 09/18/20 0500 09/19/20 0415  WBC 24.5* 17.8* 14.4* 12.4*   Microbiology Recent Results (from the past 240 hour(s))  Urine Culture     Status: None   Collection Time: 09/09/20  4:40 PM   Specimen: Urine, Random  Result Value Ref Range Status   Specimen Description URINE, RANDOM  Final   Special Requests NONE  Final   Culture   Final    NO GROWTH Performed at [facility], [address] [place], [place] [address]    Report Status 09/10/2020 FINAL  Final  Respiratory Panel by RT PCR (Flu A&B, Covid) - Urine, Catheterized     Status: None   Collection Time: 09/09/20  4:40 PM   Specimen: Urine, Catheterized; Nasopharyngeal  Result Value Ref Range Status   SARS Coronavirus 2 by RT PCR NEGATIVE NEGATIVE Final    Comment: (NOTE) SARS-CoV-2 target nucleic acids are NOT DETECTED.  The SARS-CoV-2 RNA is generally detectable in upper respiratoy specimens during the acute phase of infection. The lowest concentration of SARS-CoV-2 viral copies this assay can detect is 131 copies/mL. A negative result does not preclude SARS-Cov-2 infection and should not be used as the sole basis for treatment or other patient management decisions. A negative result may occur with  improper specimen collection/handling, submission of specimen other than nasopharyngeal swab, presence of viral mutation(s) within  the areas targeted by this assay, and inadequate number of viral copies (<131 copies/mL). A negative result must be combined with clinical observations, patient history, and epidemiological information. The expected result is Negative.  Fact Sheet for Patients:  PinkCheek.be  Fact Sheet for Healthcare Providers:  GravelBags.it  This test is no t yet approved or cleared by the [place] FDA and  has been authorized for detection and/or diagnosis of SARS-CoV-2  by FDA under an Emergency Use Authorization (EUA). This EUA will remain  in effect (meaning this test can be used) for the duration of the COVID-19 declaration under Section 564(b)(1) of the Act, 21 U.S.C. section 360bbb-3(b)(1), unless the authorization is terminated or revoked sooner.     Influenza A by PCR NEGATIVE NEGATIVE Final   Influenza B by PCR NEGATIVE NEGATIVE Final    Comment: (NOTE) The Xpert Xpress SARS-CoV-2/FLU/RSV assay is intended as an aid in  the diagnosis of influenza from Nasopharyngeal swab specimens and  should not be used as a sole basis for treatment. Nasal washings and  aspirates are unacceptable for Xpert Xpress SARS-CoV-2/FLU/RSV  testing.  Fact Sheet for Patients: PinkCheek.be  Fact Sheet for Healthcare Providers: GravelBags.it  This test is not yet approved or cleared by the [place] FDA and  has been authorized for detection and/or diagnosis of SARS-CoV-2 by  FDA under an Emergency Use Authorization (EUA). This EUA will remain  in effect (meaning this test can be used) for the duration of the  Covid-19 declaration under Section 564(b)(1) of the Act, 21  U.S.C. section 360bbb-3(b)(1), unless the authorization is  terminated or revoked. Performed at [facility], [place] [address] [place], [place] [address]   Culture, blood (routine x 2)     Status: None   Collection Time: 09/09/20  7:50 PM   Specimen: BLOOD LEFT ARM  Result Value Ref Range Status   Specimen Description BLOOD LEFT ARM  Final   Special Requests   Final    BOTTLES DRAWN AEROBIC AND ANAEROBIC Blood Culture adequate volume   Culture   Final    NO GROWTH 5 DAYS Performed at [facility], [place] [address], [place], [place] [address]    Report Status 09/14/2020 FINAL  Final  Culture, blood (routine x 2)     Status: None   Collection Time: 09/09/20  7:54 PM   Specimen: BLOOD RIGHT ARM  Result Value Ref Range  Status   Specimen Description BLOOD RIGHT ARM  Final   Special Requests   Final    BOTTLES DRAWN AEROBIC AND ANAEROBIC Blood Culture adequate volume   Culture   Final    NO GROWTH 5 DAYS Performed at [facility], [employer] [address], [place], [place] [address]    Report Status 09/14/2020 FINAL  Final  Urine culture     Status: None   Collection Time: 09/10/20 12:31 AM   Specimen: Urine, Random  Result Value Ref Range Status   Specimen Description URINE, RANDOM  Final   Special Requests NONE  Final   Culture   Final    NO GROWTH Performed at [facility], [place] [address] [place], [place] [address]    Report Status 09/11/2020 FINAL  Final  MRSA PCR Screening     Status: None   Collection Time: 09/10/20  5:05 AM   Specimen: Nasopharyngeal  Result Value Ref Range Status   MRSA by PCR NEGATIVE NEGATIVE Final    Comment:  The GeneXpert MRSA Assay (FDA approved for NASAL specimens only), is one component of a comprehensive MRSA colonization surveillance program. It is not intended to diagnose MRSA infection nor to guide or monitor treatment for MRSA infections. Performed at [facility], [place] [address] [place], [place] [address]   Culture, blood (Routine X 2) w Reflex to ID Panel     Status: None (Preliminary result)   Collection Time: 09/16/20 10:10 AM   Specimen: BLOOD RIGHT HAND  Result Value Ref Range Status   Specimen Description BLOOD RIGHT HAND  Final   Special Requests   Final    BOTTLES DRAWN AEROBIC ONLY Blood Culture results may not be optimal due to an inadequate volume of blood received in culture bottles   Culture   Final    NO GROWTH 3 DAYS Performed at [facility], [place] [address] [place], [place] [address]    Report Status PENDING  Incomplete  Culture, blood (Routine X 2) w Reflex to ID Panel     Status: None (Preliminary result)   Collection Time: 09/16/20 10:10 AM   Specimen: BLOOD RIGHT HAND  Result Value Ref Range Status    Specimen Description BLOOD RIGHT HAND  Final   Special Requests   Final    BOTTLES DRAWN AEROBIC ONLY Blood Culture results may not be optimal due to an inadequate volume of blood received in culture bottles   Culture   Final    NO GROWTH 3 DAYS Performed at [facility], [place] [address], [place], [place] [address]    Report Status PENDING  Incomplete     Medications:   . amiodarone  400 mg Oral BID  . apixaban  5 mg Oral BID  . atorvastatin  80 mg Oral Daily  . cephALEXin  500 mg Oral Q8H  . Chlorhexidine Gluconate Cloth  6 each Topical Daily  . diltiazem  180 mg Oral Daily  . docusate sodium  100 mg Oral BID  . insulin aspart  0-20 Units Subcutaneous TID WC  . insulin aspart  0-5 Units Subcutaneous QHS  . levETIRAcetam  500 mg Oral BID  . metoprolol tartrate  25 mg Oral BID  . pantoprazole  40 mg Oral QHS  . polyethylene glycol  17 g Oral Daily   Continuous Infusions:    LOS: 10 days   Charlynne Cousins  [facility]  09/19/2020, 8:26 AM

## 2020-09-19 NOTE — Progress Notes (Signed)
Rehab Admissions Coordinator Note:  Patient was screened by Cleatrice Burke for appropriateness for an Inpatient Acute Rehab Consult per therapy recommendations. .  At this time, we are recommending Inpatient Rehab consult. I will place order per protocol.  Cleatrice Burke RN MSN 09/19/2020, 10:22 AM  I can be reached at [phone].

## 2020-09-19 NOTE — Progress Notes (Addendum)
Electrophysiology Rounding Note  Patient Name: Gregory [name] Date of Encounter: 09/19/2020  Primary Cardiologist: Sanda Cosima Prentiss, MD Electrophysiologist: Dr. Caryl Comes   Subjective   "Feeling fine" this am. Denies SOB. Was SOB per notes yesterday with standing and walking. Limited mobility noted. No questions about cardioversion today.   Inpatient Medications    Scheduled Meds: . amiodarone  400 mg Oral BID  . apixaban  5 mg Oral BID  . atorvastatin  80 mg Oral Daily  . cephALEXin  500 mg Oral Q8H  . Chlorhexidine Gluconate Cloth  6 each Topical Daily  . diltiazem  180 mg Oral Daily  . docusate sodium  100 mg Oral BID  . insulin aspart  0-20 Units Subcutaneous TID WC  . insulin aspart  0-5 Units Subcutaneous QHS  . levETIRAcetam  500 mg Oral BID  . metoprolol tartrate  25 mg Oral BID  . pantoprazole  40 mg Oral QHS  . polyethylene glycol  17 g Oral Daily   Continuous Infusions:  PRN Meds: acetaminophen, metoprolol tartrate, traZODone   Vital Signs    Vitals:   09/18/20 2000 09/18/20 2141 09/18/20 2310 09/19/20 0413  BP: 121/76  119/73 126/86  Pulse: 73 89 86 65  Resp: 19 17 19 15   Temp: 98.1 F (36.7 C)  98.3 F (36.8 C) 97.8 F (36.6 C)  TempSrc: Oral  Oral Oral  SpO2: 98% 100% 98% 100%  Weight:    (!) 143.4 kg  Height:        Intake/Output Summary (Last 24 hours) at 09/19/2020 0713 Last data filed at 09/18/2020 2000 Gross per 24 hour  Intake 462 ml  Output 1250 ml  Net -788 ml   Filed Weights   09/15/20 0357 09/18/20 0631 09/19/20 0413  Weight: (!) 140.2 kg (!) 144.9 kg (!) 143.4 kg    Physical Exam    GEN- The patient is well appearing, alert and oriented x 3 today.   Head- normocephalic, atraumatic Eyes-  Sclera clear, conjunctiva pink Ears- hearing intact Oropharynx- clear Neck- supple Lungs- Clear to ausculation bilaterally, normal work of breathing Heart- Regular rate and rhythm, no murmurs, rubs or gallops GI- soft, NT, ND, +  BS Extremities- no clubbing or cyanosis. No edema Skin- no rash or lesion Psych- euthymic mood, full affect Neuro- strength and sensation are intact  Labs    CBC Recent Labs    09/18/20 0500 09/19/20 0415  WBC 14.4* 12.4*  NEUTROABS 13.5* 10.6*  HGB 11.1* 10.8*  HCT 34.2* 32.9*  MCV 104.0* 104.1*  PLT 203 628   Basic Metabolic Panel Recent Labs    09/17/20 0544 09/17/20 0544 09/18/20 0500 09/19/20 0415  NA 140   < > 135 135  K 4.6   < > 4.1 4.6  CL 103   < > 100 99  CO2 29   < > 28 28  GLUCOSE 141*   < > 197* 185*  BUN 22   < > 21 14  CREATININE 1.10   < > 1.00 0.94  CALCIUM 8.2*   < > 7.8* 7.8*  MG 2.1  --  2.0  --    < > = values in this interval not displayed.   Liver Function Tests No results for input(s): AST, ALT, ALKPHOS, BILITOT, PROT, ALBUMIN in the last 72 hours. No results for input(s): LIPASE, AMYLASE in the last 72 hours. Cardiac Enzymes No results for input(s): CKTOTAL, CKMB, CKMBINDEX, TROPONINI in the last 72 hours.  Telemetry    AF with rates 80-100s (personally reviewed)  Radiology    No results found.  Patient Profile     59 y.o.malew/PMHx of CAD (knonw chronic occl of RCA with prior PCIs to LAD, diag, and LCx), VT, ICM, OSA w/CPAP, seizures, chronic CHF, HLD, HTN, obesity  admitted to [facility] 9/26 with progressive weakness, orthostatic dizziness, and a syncopal event, He was markedly hypotensive, started on IVF with agressive replacement as well a levophed, felt to be in shock (unceratin etiology septic vs hypovolemic) also noted to be in new AFib  Assessment & Plan    1. New Afib in setting of shock Rates improved now taking po meds Plan for [facility] today on amio, po dilt, and eliquis.  No insurance, may be difficult to continue Eliquis long term.CHA2DS2Vasc is 3. Will discuss further with Dr. Caryl Comes.  2. Shock Afebrile. WBC continues to trend down.  BCdrawn 10/3 NGTD   3. AKI Resolved  4. CAD with prior PCI Echo  09/10/2020 LVEF 55-60% Denies s/s ischemia at this time.   5. Hx of VT Has MDT ICD Increased PVCs Preserved EF Echo this admit  6. CHF Volume status looks OK.  Echo 09/10/2020 LVEF 55-60%  7. Encephalopathy Appears to have resolved.  Confused 09/16/2020 Refused most meds. Given lovenox.  CT head withsubtle hypodensity and loss of gray-white matter in the right insula and frontal lobe area.Thought possiblyartifact versus acute MCA infarct. Not MRI candidate.  Today he is alert and oriented.  Plan for [facility] today at 1300. Possibly early pending Endo availability.   For questions or updates, please contact [place] Please consult [email or web address] for contact info under Cardiology/STEMI.  Signed, Shirley Friar, PA-C  09/19/2020, 7:13 AM    As above  Will plan DCCV today--may need double defibrillators   anticoagulation has been continuous since arrival   Issues of R leg weakness and overall weaknes will have to be addressed as he is currently nonambulatory

## 2020-09-19 NOTE — Evaluation (Signed)
Physical Therapy Evaluation Patient Details Name: Gregory [name] MRN: [id number] DOB: 21-Jun-1961 Today's Date: 09/19/2020   History of Present Illness  59 y.o.malew/PMHx of CAD (knonw chronic occl of RCA with prior PCIs to LAD, diag, and LCx), VT, ICM, OSA w/CPAP, seizures, chronic CHF, HLD, HTN, obesity admitted to [facility] 9/26 with progressive weakness, orthostatic dizziness, and a syncopal event, He was markedly hypotensive, started on IVF with agressive replacement as well a levophed, felt to be in shock (unceratin etiology septic vs hypovolemic) also noted to be in new AFib.  Clinical Impression  Pt presents to PT with deficits in functional mobility, gait, balance, power, strength, endurance, and cardiopulmonary function. Pt is generally weak and requires physical assistance to perform all functional mobility at this time. Pt demonstrates poor DME management and fatigues quickly, increasing his risk for falls. Pt will benefit from continued acute PT services to improve strength and activity tolerance in an effort to restore independence. PT recommends CIR placement at the time of discharge as the pt was independent prior to this admission and demonstrates the potential to make significant functional gains with high intensity inpatient PT services.    Follow Up Recommendations CIR    Equipment Recommendations  Rolling walker with 5" wheels;3in1 (PT)    Recommendations for Other Services Rehab consult     Precautions / Restrictions Precautions Precautions: Fall Restrictions Weight Bearing Restrictions: No      Mobility  Bed Mobility Overal bed mobility: Needs Assistance Bed Mobility: Rolling;Supine to Sit Rolling: Min assist   Supine to sit: Mod assist;HOB elevated        Transfers Overall transfer level: Needs assistance Equipment used: Rolling walker (2 wheeled) Transfers: Sit to/from [facility] Sit to Stand: Mod assist;From elevated  surface Stand pivot transfers: Mod assist;From elevated surface          Ambulation/Gait                Stairs            Wheelchair Mobility    Modified Rankin (Stroke Patients Only)       Balance Overall balance assessment: Needs assistance Sitting-balance support: No upper extremity supported;Feet supported Sitting balance-Leahy Scale: Fair     Standing balance support: Bilateral upper extremity supported Standing balance-Leahy Scale: Poor Standing balance comment: reliant on BUE support of RW                             Pertinent Vitals/Pain Pain Assessment: Faces Faces Pain Scale: Hurts little more Pain Location: legs Pain Descriptors / Indicators: Burning Pain Intervention(s): Monitored during session    Home Living Family/patient expects to be discharged to:: Private residence Living Arrangements: Children Available Help at Discharge: Family;Available 24 hours/day Type of Home: House Home Access: Level entry     Home Layout: Multi-level (split level) Home Equipment: None      Prior Function Level of Independence: Independent         Comments: pt independent and driving, going to gym 5 days/week     Hand Dominance        Extremity/Trunk Assessment   Upper Extremity Assessment Upper Extremity Assessment: Generalized weakness    Lower Extremity Assessment Lower Extremity Assessment: Generalized weakness;LLE deficits/detail LLE Sensation:  (burning, pins and needles)    Cervical / Trunk Assessment Cervical / Trunk Assessment: Normal  Communication   Communication: No difficulties  Cognition Arousal/Alertness: Awake/alert Behavior During Therapy: WFL for  tasks assessed/performed Overall Cognitive Status: Within Functional Limits for tasks assessed                                        General Comments General comments (skin integrity, edema, etc.): pt HR in low 100s at rest, max HR to 143  with mobility, pt left with HR of 106 when resting in recliner. Pt does report intermittent SOB with mobility but SpO2 stable on RA    Exercises     Assessment/Plan    PT Assessment Patient needs continued PT services  PT Problem List Decreased strength;Decreased activity tolerance;Decreased balance;Decreased mobility;Decreased knowledge of use of DME;Cardiopulmonary status limiting activity;Impaired sensation       PT Treatment Interventions DME instruction;Gait training;Stair training;Functional mobility training;Therapeutic activities;Therapeutic exercise;Balance training;Patient/family education    PT Goals (Current goals can be found in the Care Plan section)  Acute Rehab PT Goals Patient Stated Goal: To improve strength and return to independence PT Goal Formulation: With patient Time For Goal Achievement: 10/03/20 Potential to Achieve Goals: Good    Frequency Min 3X/week   Barriers to discharge        Co-evaluation               AM-PAC PT "6 Clicks" Mobility  Outcome Measure Help needed turning from your back to your side while in a flat bed without using bedrails?: A Little Help needed moving from lying on your back to sitting on the side of a flat bed without using bedrails?: A Lot Help needed moving to and from a bed to a chair (including a wheelchair)?: A Lot Help needed standing up from a chair using your arms (e.g., wheelchair or bedside chair)?: A Lot Help needed to walk in hospital room?: A Lot Help needed climbing 3-5 steps with a railing? : Total 6 Click Score: 12    End of Session   Activity Tolerance: Patient tolerated treatment well Patient left: in chair;with call bell/phone within reach Nurse Communication: Mobility status PT Visit Diagnosis: Other abnormalities of gait and mobility (R26.89);Muscle weakness (generalized) (M62.81)    Time: 3559-7416 PT Time Calculation (min) (ACUTE ONLY): 30 min   Charges:   PT Evaluation $PT Eval  Moderate Complexity: 1 Mod PT Treatments $Therapeutic Activity: 8-22 mins        Zenaida Niece, PT, DPT Acute Rehabilitation Pager: [phone]   Zenaida Niece 09/19/2020, 9:57 AM

## 2020-09-19 NOTE — Progress Notes (Signed)
Mobility Specialist - Progress Note   09/19/20 1512  Mobility  Activity Refused mobility   Pt declined standing at bedside or transferring to the chair. He states he has been sitting on the edge of bed frequently today and will continue to do so.   Pricilla Handler Mobility Specialist Mobility Specialist Phone: [phone]

## 2020-09-19 NOTE — Plan of Care (Signed)
  Problem: Health Behavior/Discharge Planning: Goal: Ability to manage health-related needs will improve Outcome: Progressing   

## 2020-09-19 NOTE — Progress Notes (Signed)
Patient wishing to get back into bed. Patient with nursing staff, gait belt and rolling walker. Patient states that sometimes his "right thigh gives out" patient 2 assist to stand with gait belt and walker. patient pivoted to bed and sat down. Patient assisted to lie back. And Dr. Caryl Comes in to see patient will monitor. Kennadie Brenner, Bettina Gavia RN

## 2020-09-19 NOTE — Transfer of Care (Signed)
Immediate Anesthesia Transfer of Care Note  Patient: Gregory [name]  Procedure(s) Performed: CARDIOVERSION (N/A )  Patient Location: Endoscopy Unit  Anesthesia Type:General  Level of Consciousness: awake  Airway & Oxygen Therapy: Patient Spontanous Breathing  Post-op Assessment: Report given to RN and Post -op Vital signs reviewed and stable  Post vital signs: Reviewed and stable  Last Vitals:  Vitals Value Taken Time  BP 126/75   Temp    Pulse 94   Resp 14   SpO2 95%     Last Pain:  Vitals:   09/19/20 1155  TempSrc: Oral  PainSc: 0-No pain      Patients Stated Pain Goal: 0 (18/29/93 7169)  Complications: No complications documented.

## 2020-09-19 NOTE — Progress Notes (Signed)
Preop Dx afib Post op DX  NSR  Procedure  DC Cardioversion   Pt was sedated by anesthesia receiving 70 mg Propafol  A synchronized shock 200 joules restored sinus Rhythm   Pt tolerated without difficulty

## 2020-09-20 LAB — GLUCOSE, CAPILLARY
Glucose-Capillary: 186 mg/dL — ABNORMAL HIGH (ref 70–99)
Glucose-Capillary: 202 mg/dL — ABNORMAL HIGH (ref 70–99)
Glucose-Capillary: 228 mg/dL — ABNORMAL HIGH (ref 70–99)
Glucose-Capillary: 227 mg/dL — ABNORMAL HIGH (ref 70–99)

## 2020-09-20 MED ORDER — FUROSEMIDE 10 MG/ML IJ SOLN
60.0000 mg | Freq: Once | INTRAMUSCULAR | Status: AC
  Administered 2020-09-20: 60 mg via INTRAVENOUS
  Filled 2020-09-20: qty 6

## 2020-09-20 MED ORDER — SACUBITRIL-VALSARTAN 24-26 MG PO TABS
1.0000 | ORAL_TABLET | Freq: Two times a day (BID) | ORAL | Status: DC
  Administered 2020-09-20 – 2020-09-24 (×9): 1 via ORAL
  Filled 2020-09-20 (×9): qty 1

## 2020-09-20 NOTE — Progress Notes (Signed)
Patient asking about his fluid pill and if he was going to be placed back on it. Patient felt like his left arm/hand and ankles was swelling more. No difference to prior assessment.

## 2020-09-20 NOTE — Anesthesia Postprocedure Evaluation (Signed)
Anesthesia Post Note  Patient: Gregory [name]  Procedure(s) Performed: CARDIOVERSION (N/A )     Patient location during evaluation: Endoscopy Anesthesia Type: General Level of consciousness: awake and alert Pain management: pain level controlled Vital Signs Assessment: post-procedure vital signs reviewed and stable Respiratory status: spontaneous breathing, nonlabored ventilation, respiratory function stable and patient connected to nasal cannula oxygen Cardiovascular status: stable and blood pressure returned to baseline Postop Assessment: no apparent nausea or vomiting Anesthetic complications: no   No complications documented.  Last Vitals:  Vitals:   09/20/20 1120 09/20/20 1605  BP: 135/82 132/85  Pulse: 87 (!) 140  Resp: 15 18  Temp: 36.6 C 36.6 C  SpO2: 98% 97%    Last Pain:  Vitals:   09/20/20 1605  TempSrc: Oral  PainSc:                  Catalina Gravel

## 2020-09-20 NOTE — Plan of Care (Signed)
  Problem: Clinical Measurements: Goal: Respiratory complications will improve Outcome: Progressing Goal: Cardiovascular complication will be avoided Outcome: Progressing   

## 2020-09-20 NOTE — Progress Notes (Signed)
[facility] PROGRESS NOTE    Progress Note  Gregory [name]  WGN:[id number] DOB: Apr 15, 1961 DOA: 09/09/2020 PCP: Patient, No Pcp Per     Brief Narrative:   Gregory [name] is an 59 y.o. male past medical history of coronary artery disease, chronic diastolic heart failure, ischemic cardiomyopathy essential hypertension status post cardiac defibrillation who initially presented to the ED with nausea vomiting and diarrhea was found to be in acute kidney injury requiring vasopressors initially admitted to the ICU infectious work-up turned out to be negative.  He then developed A. 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 EP was consulted and recommended DCCV on 09/19/2020. Patient did develop acute confusion, leukocytosis and was suspected to be in sepsis due to UTI started empirically on antibiotics.  Assessment/Plan:   Atrial fibrillation with rapid ventricular response ([place]): Rate currently ranging from 90-1 20. Continue amnio, metoprolol and Cardizem he had unsuccessful DC cardioversion. He is on Eliquis.   EP performed DCCV on 09/20/2019 when she remained in sinus rhythm. The patient has insurance and it will cover Eliquis. Physical therapy evaluated the patient and recommended IR consult inpatient rehab  Acute confusional state/acute encephalopathy: Likely due to infectious etiology.  CT unremarkable unable to do MRI due to AICD. His mental status has been improving slowly has been neurologically intact.  Severe sepsis due to UTI: He was started on IV Rocephin his encephalopathy resolved. He will complete his course of oral antibiotics in house. Culture data has remained negative till date.  Acute kidney injury: Likely prerenal in the setting of A. fib with RVR with a baseline less  than 1. Resolved with IV fluid hydration.    Thrombocytopenia: After starting heparin head bodies were negative there is no evidence of bleeding after stopping heparin his platelets are improving.  Hypocalcemia: Was giving calcium gluconate now resolved.  Coronary artery disease: Status post AICD, denies any chest pain or shortness of breath.  Macrocytic anemia: Folate of 300, B12 was 683. Check an anemia panel.  Chronic combined systolic and diastolic heart failure (HCC) Appears euvolemic on physical exam KVO IV fluids last echo showed an EF of 55%.  Obstructive sleep apnea: Continue CPAP at night.  Mild transaminitis: Abdominal ultrasound was negative for acute cholecystitis he denies any abdominal pain. Likely cardiovascular mediated now resolved.  Seizures: Continue Keppra.   DVT prophylaxis: Eliquis Family Communication:none Status is: Inpatient  Remains inpatient appropriate because:Hemodynamically unstable   Dispo: The patient is from: Home              Anticipated d/c is to: CIR              Anticipated d/c date is: 2 days              Patient currently is not medically stable to d/c.        Code Status:     Code Status Orders  (From admission, onward)         Start     Ordered   09/09/20 1642  Full code  Continuous        09/09/20 1643        Code Status History    Date Active Date Inactive Code Status Order ID Comments User Context   06/07/2018 2227 06/09/2018 1730 Full Code [id number]  Jani Gravel, MD Inpatient   12/19/2016 1651  12/19/2016 2113 Full Code [id number]  [name], MD Inpatient   05/04/2016 2100 05/07/2016 2158 Full Code [id number]  Jerline Pain, MD Inpatient   Advance Care Planning Activity        IV Access:    Peripheral IV   Procedures and diagnostic studies:   No results found.   Medical Consultants:    None.  Anti-Infectives:   keflex  Subjective:    Gregory [name] no new complaints  feels great.  Objective:    Vitals:   09/19/20 2356 09/20/20 0400 09/20/20 0436 09/20/20 0818  BP: (!) 140/91 124/85    Pulse: 84 83  86  Resp: 16 14  16   Temp: 98.5 F (36.9 C) 97.7 F (36.5 C)    TempSrc: Axillary     SpO2: 96% 97%  95%  Weight:   (!) 144.9 kg   Height:       SpO2: 95 % O2 Flow Rate (L/min): 2 L/min   Intake/Output Summary (Last 24 hours) at 09/20/2020 0932 Last data filed at 09/20/2020 0441 Gross per 24 hour  Intake 200 ml  Output 1000 ml  Net -800 ml   Filed Weights   09/18/20 0631 09/19/20 0413 09/20/20 0436  Weight: (!) 144.9 kg (!) 143.4 kg (!) 144.9 kg    Exam: General exam: In no acute distress. Respiratory system: Good air movement and clear to auscultation. Cardiovascular system: S1 & S2 heard, RRR. No JVD. Gastrointestinal system: Abdomen is nondistended, soft and nontender.  Extremities: No pedal edema. Skin: No rashes, lesions or ulcers  Data Reviewed:    Labs: Basic Metabolic Panel: Recent Labs  Lab 09/14/20 0426 09/14/20 0426 09/15/20 0428 09/15/20 0428 09/16/20 0601 09/16/20 0601 09/17/20 0544 09/17/20 0544 09/18/20 0500 09/19/20 0415  NA 135   < > 136  --  138  --  140  --  135 135  K 4.1   < > 4.3   < > 4.6   < > 4.6   < > 4.1 4.6  CL 101   < > 101  --  102  --  103  --  100 99  CO2 27   < > 28  --  28  --  29  --  28 28  GLUCOSE 138*   < > 172*  --  190*  --  141*  --  197* 185*  BUN 17   < > 19  --  20  --  22  --  21 14  CREATININE 1.11   < > 1.17  --  1.18  --  1.10  --  1.00 0.94  CALCIUM 7.9*   < > 8.2*  --  8.3*  --  8.2*  --  7.8* 7.8*  MG 2.3  --  2.4  --  2.3  --  2.1  --  2.0  --   PHOS 2.2*  --  1.8*  --   --   --   --   --   --   --    < > = values in this interval not displayed.   GFR Estimated Creatinine Clearance: 131.7 mL/min (by C-G formula based on SCr of 0.94 mg/dL). Liver Function Tests: No results for input(s): AST, ALT, ALKPHOS, BILITOT, PROT, ALBUMIN in the last 168 hours. No results  for input(s): LIPASE, AMYLASE in the last 168 hours. Recent Labs  Lab 09/16/20 0951  AMMONIA 17   Coagulation profile Recent Labs  Lab  09/19/20 0546  INR 1.5*   COVID-19 Labs  Recent Labs    09/19/20 1021  FERRITIN 1,062*    Lab Results  Component Value Date   [place] NEGATIVE 09/09/2020   [place] Not Detected 12/05/2019    CBC: Recent Labs  Lab 09/14/20 0858 09/14/20 0858 09/15/20 0428 09/16/20 0820 09/17/20 0630 09/18/20 0500 09/19/20 0415  WBC 17.3*   < > 19.5* 24.5* 17.8* 14.4* 12.4*  NEUTROABS 16.1*  --   --   --   --  13.5* 10.6*  HGB 13.4   < > 13.1 12.4* 11.8* 11.1* 10.8*  HCT 40.9   < > 40.2  37.5 39.6 37.1* 34.2* 32.9*  MCV 103.0*   < > 104.1* 105.6* 105.1* 104.0* 104.1*  PLT 122*   < > 155 167 187 203 199   < > = values in this interval not displayed.   Cardiac Enzymes: No results for input(s): CKTOTAL, CKMB, CKMBINDEX, TROPONINI in the last 168 hours. BNP (last 3 results) No results for input(s): PROBNP in the last 8760 hours. CBG: Recent Labs  Lab 09/19/20 0806 09/19/20 1138 09/19/20 1619 09/19/20 2015 09/20/20 0612  GLUCAP 185* 171* 236* 237* 186*   D-Dimer: No results for input(s): DDIMER in the last 72 hours. Hgb A1c: No results for input(s): HGBA1C in the last 72 hours. Lipid Profile: No results for input(s): CHOL, HDL, LDLCALC, TRIG, CHOLHDL, LDLDIRECT in the last 72 hours. Thyroid function studies: No results for input(s): TSH, T4TOTAL, T3FREE, THYROIDAB in the last 72 hours.  Invalid input(s): FREET3 Anemia work up: Recent Labs    09/19/20 1021 09/19/20 1022  FERRITIN 1,062*  --   TIBC 153*  --   IRON 27*  --   RETICCTPCT  --  2.1   Sepsis Labs: Recent Labs  Lab 09/16/20 0820 09/17/20 0630 09/18/20 0500 09/19/20 0415  WBC 24.5* 17.8* 14.4* 12.4*   Microbiology Recent Results (from the past 240 hour(s))  Culture, blood (Routine X 2) w Reflex to ID Panel     Status: None (Preliminary result)    Collection Time: 09/16/20 10:10 AM   Specimen: BLOOD RIGHT HAND  Result Value Ref Range Status   Specimen Description BLOOD RIGHT HAND  Final   Special Requests   Final    BOTTLES DRAWN AEROBIC ONLY Blood Culture results may not be optimal due to an inadequate volume of blood received in culture bottles   Culture   Final    NO GROWTH 3 DAYS Performed at [facility], [place] [address], [place], [place] [address]    Report Status PENDING  Incomplete  Culture, blood (Routine X 2) w Reflex to ID Panel     Status: None (Preliminary result)   Collection Time: 09/16/20 10:10 AM   Specimen: BLOOD RIGHT HAND  Result Value Ref Range Status   Specimen Description BLOOD RIGHT HAND  Final   Special Requests   Final    BOTTLES DRAWN AEROBIC ONLY Blood Culture results may not be optimal due to an inadequate volume of blood received in culture bottles   Culture   Final    NO GROWTH 3 DAYS Performed at [facility], [place] [address], [place], [place] [address]    Report Status PENDING  Incomplete     Medications:   . amiodarone  400 mg Oral BID  . apixaban  5 mg Oral BID  . atorvastatin  80 mg Oral Daily  . cephALEXin  500 mg Oral Q8H  .  Chlorhexidine Gluconate Cloth  6 each Topical Daily  . diltiazem  180 mg Oral Daily  . docusate sodium  100 mg Oral BID  . furosemide  60 mg Intravenous Once  . insulin aspart  0-20 Units Subcutaneous TID WC  . insulin aspart  0-5 Units Subcutaneous QHS  . levETIRAcetam  500 mg Oral BID  . metoprolol tartrate  25 mg Oral BID  . pantoprazole  40 mg Oral QHS  . polyethylene glycol  17 g Oral Daily   Continuous Infusions:    LOS: 11 days   Charlynne Cousins  [facility]  09/20/2020, 9:32 AM

## 2020-09-20 NOTE — PMR Pre-admission (Signed)
PMR Admission Coordinator Pre-Admission Assessment  Patient: Gregory [name] is an 59 y.o., male MRN: [id number] DOB: Oct 14, 1961 Height: _0  (198.1 cm) Weight: 135.7 kg  Insurance Information HMO:     PPO:      PCP:      IPA:      80/20:      OTHER:  PRIMARY: Worker's Comp      Policy#: [id number]      Subscriber:  CM Name: Jari Favre   /adjuster   Phone#: [phone]   Fax#: [phone] Pre-Cert#: case  [id number].  Authorization #[id number].  Authorization given for 12 days.  If requires longer than 12 days, updates are requested on day 7.   Employer: [employer] Benefits:  Phone #:      Name:  Eff. Date:      Deduct:       Out of Pocket Max:       Life Max:  CIR:       SNF:  Outpatient:      Co-Pay:  Home Health:       Co-Pay:  DME:      Co-Pay: Providers:  SECONDARY:       Policy#:      Phone#:   Development worker, community:       Phone#:   The Therapist, art Information Summary" for patients in Inpatient Rehabilitation Facilities with attached "Privacy Act [place] Records" was provided and verbally reviewed with: N/A  Emergency Contact Information Contact Information    Name Relation Home Work Mobile   [place] Mother [phone]     Gregory [name] Brother [phone]     Gregory [name] Daughter   [phone]   Gregory [name] Son   [phone]      Current Medical History  Patient Admitting Diagnosis: debility 2/2 AFIB with RVR  History of Present Illness: Gregory [name] is a 59 year old right-handed male with history of hyperlipidemia, seizure disorder maintained on Keppra, CAD, diastolic congestive heart failure maintained on Entresto, ischemic cardiomyopathy, VT status post AICD maintained on Plavix, quit smoking 7 years ago.  Per chart review lives with his son who works from home.  Multilevel home 10 steps to entry.  Reportedly independent prior to admission and active.  Presented 09/09/2020 with generalized weakness and dizziness in the  setting of multiple days of diarrhea.  Denied any nausea vomiting or abdominal pain.  Ultrasound the abdomen showed cholelithiasis without evidence of acute cholecystitis and fatty liver.  Chest x-ray negative.  Admission chemistry sodium 130, potassium 3.1, chloride 95, glucose 189, BUN 55, creatinine 5.60, calcium 5.2, AST 88, lactic acid 3.2, troponin 48, magnesium 1.6, urine culture no growth.  Echocardiogram with ejection fraction of 55 to 60% no wall motion abnormalities.  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  Cardiac catheterization completed 09/24/2020 per Dr. Alvester Chou for follow-up with results pending.  Patient did experience some altered mental status cranial CT scan completed which was reviewed by neurology services no acute process noted.  He was placed empirically on IV Rocephin for question UTI latest cultures negative.  Follow-up cardiology services for acute on chronic diastolic congestive heart failure and patient has undergone diuresis.  Patient with persistent elevations in glucose 186-237 and hemoglobin A1c 6.9 currently maintained on sliding scale insulin as well as scheduled  Levemir 5 units twice daily.  Patient remains on [place] for history of seizure disorder.  AKI likely prerenal in the setting of A. fib with RVR placed on gentle IV fluids and latest creatinine 0.86.  Tolerating a regular diet.   Patient's medical record from [facility] has been reviewed by the rehabilitation admission coordinator and physician.  Past Medical History  Past Medical History:  Diagnosis Date  . CAD (coronary artery disease) 12/01/2013  . Chronic combined systolic and diastolic CHF, NYHA class 1 ([place])  12/01/2013  . Erectile dysfunction 12/01/2013  . HTN (hypertension) 12/01/2013  . Hyperlipidemia 12/01/2013  . Ischemic cardiomyopathy 12/01/2013  . Sleep apnea     Family History   family history includes Brain cancer in his father; Heart disease in his mother; Hypertension in his daughter.  Prior Rehab/Hospitalizations Has the patient had prior rehab or hospitalizations prior to admission? No  Has the patient had major surgery during 100 days prior to admission? Yes   Current Medications  Current Facility-Administered Medications:  .  0.9 %  sodium chloride infusion, 250 mL, Intravenous, PRN, Lorretta Harp, MD .  acetaminophen (TYLENOL) tablet 650 mg, 650 mg, Oral, Q4H PRN, Lorretta Harp, MD, 650 mg at 09/28/20 1332 .  acetaminophen (TYLENOL) tablet 650 mg, 650 mg, Oral, Q4H PRN, Lorretta Harp, MD .  amiodarone (PACERONE) tablet 400 mg, 400 mg, Oral, Q12H, 400 mg at 09/29/20 0942 **FOLLOWED BY** [START ON 10/07/2020] amiodarone (PACERONE) tablet 400 mg, 400 mg, Oral, Daily, Chandrasekhar, Mahesh A, MD .  apixaban (ELIQUIS) tablet 5 mg, 5 mg, Oral, BID, Chandrasekhar, Mahesh A, MD, 5 mg at 09/29/20 0941 .  atorvastatin (LIPITOR) tablet 80 mg, 80 mg, Oral, QHS, Lorretta Harp, MD, 80 mg at 09/28/20 2200 .  carvedilol (COREG CR) 24 hr capsule 80 mg, 80 mg, Oral, Daily, Lorretta Harp, MD, 80 mg at 09/29/20 0938 .  Chlorhexidine Gluconate Cloth 2 % PADS 6 each, 6 each, Topical, Daily, Lorretta Harp, MD, 6 each at 09/26/20 1107 .  clopidogrel (PLAVIX) tablet 75 mg, 75 mg, Oral, Q breakfast, Lorretta Harp, MD, 75 mg at 09/29/20 0942 .  docusate sodium (COLACE) capsule 100 mg, 100 mg, Oral, BID, Lorretta Harp, MD, 100 mg at 09/29/20 0940 .  furosemide (LASIX) tablet 40 mg, 40 mg, Oral, BID, Chandrasekhar, Mahesh A, MD, 40 mg at 09/29/20 0940 .  hydrocortisone cream 1 % 1 application, 1 application, Topical, TID PRN, Charlynne Cousins, MD, 1 application at  98/26/41 0955 .  hydrOXYzine (ATARAX/VISTARIL) tablet 10 mg, 10 mg, Oral, TID PRN, Charlynne Cousins, MD, 10 mg at 09/29/20 0939 .  insulin aspart (novoLOG) injection 0-20 Units, 0-20 Units, Subcutaneous, TID WC, Lorretta Harp, MD, 3 Units at 09/28/20 1137 .  insulin aspart (novoLOG) injection 0-5 Units, 0-5 Units, Subcutaneous, QHS, Lorretta Harp, MD, 2 Units at 09/24/20 2224 .  insulin detemir (LEVEMIR) injection 5 Units, 5 Units, Subcutaneous, BID, Lorretta Harp, MD, 5 Units at 09/29/20 0945 .  levETIRAcetam (KEPPRA) tablet 500 mg, 500 mg, Oral, BID, Lorretta Harp, MD, 500 mg at 09/29/20 0941 .  magnesium oxide (MAG-OX) tablet 200 mg, 200 mg, Oral, Daily, Barrett, Rhonda G, PA-C, 200 mg at 09/29/20 0941 .  metoprolol tartrate (LOPRESSOR) injection 5 mg, 5 mg, Intravenous, Q4H PRN, Lorretta Harp, MD, 5 mg at 09/16/20 1115 .  morphine 2 MG/ML injection 2 mg, 2 mg, Intravenous, Q1H PRN, Lorretta Harp,  MD, 2 mg at 09/28/20 1837 .  ondansetron (ZOFRAN) injection 4 mg, 4 mg, Intravenous, Q6H PRN, Lorretta Harp, MD .  pantoprazole (PROTONIX) EC tablet 40 mg, 40 mg, Oral, Daily, Lorretta Harp, MD, 40 mg at 09/29/20 0943 .  polyethylene glycol (MIRALAX / GLYCOLAX) packet 17 g, 17 g, Oral, Daily, Lorretta Harp, MD, 17 g at 09/29/20 0940 .  potassium chloride (KLOR-CON) CR tablet 10 mEq, 10 mEq, Oral, Daily, Lorretta Harp, MD, 10 mEq at 09/29/20 0940 .  sacubitril-valsartan (ENTRESTO) 97-103 mg per tablet, 1 tablet, Oral, BID, Lorretta Harp, MD, 1 tablet at 09/29/20 0940 .  sodium chloride flush (NS) 0.9 % injection 3 mL, 3 mL, Intravenous, Q12H, Lorretta Harp, MD, 3 mL at 09/28/20 2206 .  sodium chloride flush (NS) 0.9 % injection 3 mL, 3 mL, Intravenous, PRN, Lorretta Harp, MD .  traZODone (DESYREL) tablet 100 mg, 100 mg, Oral, QHS PRN, Lorretta Harp, MD, 100 mg at 09/28/20 2202  Patients Current Diet:  Diet Order            Diet regular Room  service appropriate? Yes; Fluid consistency: Thin  Diet effective now                 Precautions / Restrictions Precautions Precautions: Fall Restrictions Weight Bearing Restrictions: No   Has the patient had 2 or more falls or a fall with injury in the past year? No  Prior Activity Level Community (5-7x/wk): was going to the gym 5 days/week, driving, no dme  Prior Functional Level Self Care: Did the patient need help bathing, dressing, using the toilet or eating? Independent  Indoor Mobility: Did the patient need assistance with walking from room to room (with or without device)? Independent  Stairs: Did the patient need assistance with internal or external stairs (with or without device)? Independent  Functional Cognition: Did the patient need help planning regular tasks such as shopping or remembering to take medications? Independent  Home Assistive Devices / Equipment Home Assistive Devices/Equipment: None Home Equipment: None  Prior Device Use: Indicate devices/aids used by the patient prior to current illness, exacerbation or injury? None of the above  Current Functional Level Cognition  Overall Cognitive Status: Within Functional Limits for tasks assessed Orientation Level: Oriented X4 General Comments: Pt very fearful of HR going up and getting shocked again.     Extremity Assessment (includes Sensation/Coordination)  Upper Extremity Assessment: Overall WFL for tasks assessed  Lower Extremity Assessment: Defer to PT evaluation LLE Sensation:  (Pt. reports nuimbness in LE. he states it is because of [place])    ADLs  Overall ADL's : Needs assistance/impaired Eating/Feeding: Independent, Sitting Grooming: Set up, Wash/dry face, Oral care, Wash/dry hands, Sitting Upper Body Bathing: Set up, Sitting Lower Body Bathing: Minimal assistance, Sitting/lateral leans Lower Body Bathing Details (indicate cue type and reason): long sitting.  Patient is not able to let go  of the RW for LB ADL Upper Body Dressing : Set up, Sitting Lower Body Dressing: Moderate assistance, Sit to/from stand Toilet Transfer: Min guard, RW Toileting- Clothing Manipulation and Hygiene: Maximal assistance, Sit to/from stand Functional mobility during ADLs: Min guard, Rolling walker General ADL Comments: Patient had just returned to bed and declined back to recliner.  Able to sit EOB.  Mod A    Mobility  Overal bed mobility: Modified Independent Bed Mobility: Supine to Sit, Sit to Supine Rolling: Min assist Supine to sit: Modified independent (Device/Increase time), HOB  elevated Sit to supine: Modified independent (Device/Increase time), HOB elevated General bed mobility comments: No assistance for mobility.    Transfers  Overall transfer level: Needs assistance Equipment used: Rolling walker (2 wheeled) Transfers: Sit to/from Stand Sit to Stand: Mod assist Stand pivot transfers: Min guard General transfer comment: Min assistance to rise into standing from elevated bed.  Continues to pull on RW for support.  He did perform additional transfer and followed commands to push into standing.    Ambulation / Gait / Stairs / Wheelchair Mobility  Ambulation/Gait Ambulation/Gait assistance: Counsellor (Feet): 100 Feet Assistive device: Rolling walker (2 wheeled) Gait Pattern/deviations: Step-through pattern, Decreased stride length, Trunk flexed General Gait Details: Cues for scap retraction and upper trunk control.  Pt less anxious about his HR today.  Cues for safety with RW and for turns and backing in tight spaces. Gait velocity: decr Gait velocity interpretation: <1.8 ft/sec, indicate of risk for recurrent falls    Posture / Balance Balance Overall balance assessment: Needs assistance Sitting-balance support: No upper extremity supported, Feet supported Sitting balance-Leahy Scale: Good Standing balance support: Bilateral upper extremity supported Standing  balance-Leahy Scale: Poor Standing balance comment: walker and supervision    Special needs/care consideration Diabetic management yes  AICD   Previous Home Environment (from acute therapy documentation) Living Arrangements: Children Available Help at Discharge: Family, Available 24 hours/day Type of Home: House Home Layout: Multi-level Alternate Level Stairs-Rails: Right Alternate Level Stairs-Number of Steps: 10 Home Access: Level entry Bathroom Shower/Tub: Chiropodist: Standard Home Care Services: No  Discharge Living Setting Plans for Discharge Living Setting: Lives with (comment) (son) Type of Home at Discharge: House Discharge Home Layout: Multi-level (split level) Alternate Level Stairs-Rails: Right Alternate Level Stairs-Number of Steps: 10 Discharge Home Access: Level entry Discharge Bathroom Shower/Tub: Tub/shower unit Discharge Bathroom Toilet: Standard Discharge Bathroom Accessibility: Yes How Accessible: Accessible via walker Does the patient have any problems obtaining your medications?: No  Social/Family/Support Systems Anticipated Caregiver: Wendie Chess IV, son Anticipated Ambulance person Information: [phone] Ability/Limitations of Caregiver: works during the day from home Caregiver Availability: 24/7 Discharge Plan Discussed with Primary Caregiver: Yes (per patient) Is Caregiver In Agreement with Plan?: Yes Does Caregiver/Family have Issues with Lodging/Transportation while Pt is in Rehab?: No  Goals Patient/Family Goal for Rehab: PT/OT mod I, SLP n/a Expected length of stay: 9-12 days Additional Information: worker's comp info: claim [id number], DOI 05/29/04, Jari Favre [phone] Pt/Family Agrees to Admission and willing to participate: Yes Program Orientation Provided & Reviewed with Pt/Caregiver Including Roles  & Responsibilities: Yes  Decrease burden of Care through IP rehab admission: n/a  Possible need for  SNF placement upon discharge: not anticipated  Patient Condition: I have reviewed medical records from [facility], spoken with CM, and patient. I met with patient at the bedside for inpatient rehabilitation assessment.  Patient will benefit from ongoing PT and OT, can actively participate in 3 hours of therapy a day 5 days of the week, and can make measurable gains during the admission.  Patient will also benefit from the coordinated team approach during an Inpatient Acute Rehabilitation admission.  The patient will receive intensive therapy as well as Rehabilitation physician, nursing, social worker, and care management interventions.  Due to safety, skin/wound care, disease management, medication administration, pain management and patient education the patient requires 24 hour a day rehabilitation nursing.  The patient is currently Min A-Mod I with mobility and setup-Mod A with basic ADLs.  Discharge setting and therapy post discharge at home with home health is anticipated.  Patient has agreed to participate in the Acute Inpatient Rehabilitation Program and will admit today.  Preadmission Screen Completed By: Shann Medal, PT, DPT with updates by   Bethel Born, 09/29/2020 10:12 AM ______________________________________________________________________   Discussed status with Dr. Letta Pate on 09/29/20  at 10:12 AM and received approval for admission today.  Admission Coordinator: Shann Medal, PT, DPT ;; Bethel Born, CCC-SLP, time 10:12 AM/Date 09/29/20    Assessment/Plan: Diagnosis:  Debility following acute renal failure 1. Does the need for close, 24 hr/day Medical supervision in concert with the patient's rehab needs make it unreasonable for this patient to be served in a less intensive setting? Yes 2. Co-Morbidities requiring supervision/potential complications: Atrial and ventricular arrythmias, s/p AICD, CHF, hx seizure d/o 3. Due to bladder management,  bowel management, safety, skin/wound care, disease management, medication administration, pain management and patient education, does the patient require 24 hr/day rehab nursing? Yes 4. Does the patient require coordinated care of a physician, rehab nurse, PT, OT,  to address physical and functional deficits in the context of the above medical diagnosis(es)? Yes Addressing deficits in the following areas: balance, endurance, locomotion, strength, transferring, bowel/bladder control, bathing, dressing, feeding, grooming, toileting and psychosocial support 5. Can the patient actively participate in an intensive therapy program of at least 3 hrs of therapy 5 days a week? Yes 6. The potential for patient to make measurable gains while on inpatient rehab is good 7. Anticipated functional outcomes upon discharge from inpatient rehab: modified independent and supervision PT, modified independent and supervision OT, n/a SLP 8. Estimated rehab length of stay to reach the above functional goals is: 9-12d 9. Anticipated discharge destination: Home 10. Overall Rehab/Functional Prognosis: good   MD Signature: Charlett Blake M.D. [facility] FAAPM&R (Neuromuscular Med) Diplomate Am Board of Electrodiagnostic Med Fellow Am Board of Interventional Pain

## 2020-09-20 NOTE — Progress Notes (Addendum)
Progress Note  Patient Name: Gregory [name] Date of Encounter: 09/20/2020  [facility] Cardiologist: Sanda Klein, MD   Subjective   No chest pain and no SOB  Inpatient Medications    Scheduled Meds: . amiodarone  400 mg Oral BID  . apixaban  5 mg Oral BID  . atorvastatin  80 mg Oral Daily  . cephALEXin  500 mg Oral Q8H  . Chlorhexidine Gluconate Cloth  6 each Topical Daily  . diltiazem  180 mg Oral Daily  . docusate sodium  100 mg Oral BID  . insulin aspart  0-20 Units Subcutaneous TID WC  . insulin aspart  0-5 Units Subcutaneous QHS  . levETIRAcetam  500 mg Oral BID  . metoprolol tartrate  25 mg Oral BID  . pantoprazole  40 mg Oral QHS  . polyethylene glycol  17 g Oral Daily   Continuous Infusions:  PRN Meds: acetaminophen, metoprolol tartrate, traZODone   Vital Signs    Vitals:   09/19/20 2356 09/20/20 0400 09/20/20 0436 09/20/20 0818  BP: (!) 140/91 124/85    Pulse: 84 83  86  Resp: 16 14  16   Temp: 98.5 F (36.9 C) 97.7 F (36.5 C)    TempSrc: Axillary     SpO2: 96% 97%  95%  Weight:   (!) 144.9 kg   Height:        Intake/Output Summary (Last 24 hours) at 09/20/2020 0954 Last data filed at 09/20/2020 0441 Gross per 24 hour  Intake 200 ml  Output 1000 ml  Net -800 ml   Last 3 Weights 09/20/2020 09/19/2020 09/18/2020  Weight (lbs) 319 lb 7.1 oz 316 lb 2.2 oz 319 lb 7.1 oz  Weight (kg) 144.9 kg 143.4 kg 144.9 kg      Telemetry    SR - Personally Reviewed  ECG    Post DCCV SR  No acute ST changes - Personally Reviewed  Physical Exam   GEN: No acute distress.   Neck: No JVD Cardiac: RRR, no murmurs, rubs, or gallops.  Respiratory: Clear to auscultation bilaterally. GI: Soft, nontender, non-distended  MS: 2+ edema lower ext; No deformity. Neuro:  Nonfocal  Psych: Normal affect   Labs    High Sensitivity Troponin:   Recent Labs  Lab 09/09/20 1336  TROPONINIHS 48*      Chemistry Recent Labs  Lab 09/16/20 0601  09/16/20 0601 09/17/20 0544 09/18/20 0500 09/19/20 0415  NA 138   < > 140 135 135  K 4.6   < > 4.6 4.1 4.6  CL 102   < > 103 100 99  CO2 28   < > 29 28 28   GLUCOSE 190*   < > 141* 197* 185*  BUN 20   < > 22 21 14   CREATININE 1.18   < > 1.10 1.00 0.94  CALCIUM 8.3*   < > 8.2* 7.8* 7.8*  GFRNONAA >60   < > >60 >60 >60  GFRAA >60  --  >60 >60  --   ANIONGAP 8   < > 8 7 8    < > = values in this interval not displayed.     Hematology Recent Labs  Lab 09/17/20 0630 09/17/20 0630 09/18/20 0500 09/19/20 0415 09/19/20 1022  WBC 17.8*  --  14.4* 12.4*  --   RBC 3.53*   < > 3.29* 3.16* 3.29*  HGB 11.8*  --  11.1* 10.8*  --   HCT 37.1*  --  34.2* 32.9*  --  MCV 105.1*  --  104.0* 104.1*  --   MCH 33.4  --  33.7 34.2*  --   MCHC 31.8  --  32.5 32.8  --   RDW 18.5*  --  18.4* 18.4*  --   PLT 187  --  203 199  --    < > = values in this interval not displayed.    BNPNo results for input(s): BNP, PROBNP in the last 168 hours.   DDimer No results for input(s): DDIMER in the last 168 hours.   Radiology    No results found.  Cardiac Studies   Echo 09/10/20  IMPRESSIONS    1. Left ventricular ejection fraction, by estimation, is 55 to 60%. The  left ventricle has normal function. The left ventricle has no regional  wall motion abnormalities. Left ventricular diastolic function could not  be evaluated.  2. Right ventricular systolic function is normal. The right ventricular  size is normal.  3. Left atrial size was mildly dilated.  4. The mitral valve is normal in structure. Trivial mitral valve  regurgitation. No evidence of mitral stenosis.  5. The aortic valve is normal in structure. Aortic valve regurgitation is  not visualized. Mild to moderate aortic valve sclerosis/calcification is  present, without any evidence of aortic stenosis.  6. The inferior vena cava is normal in size with greater than 50%  respiratory variability, suggesting right atrial pressure of  3 mmHg.   FINDINGS  Left Ventricle: Left ventricular ejection fraction, by estimation, is 55  to 60%. The left ventricle has normal function. The left ventricle has no  regional wall motion abnormalities. Definity contrast agent was given IV  to delineate the left ventricular  endocardial borders. The left ventricular internal cavity size was normal  in size. There is no left ventricular hypertrophy. Left ventricular  diastolic function could not be evaluated due to atrial fibrillation. Left  ventricular diastolic function could  not be evaluated.     LV Wall Scoring:   Right Ventricle: The right ventricular size is normal. No increase in  right ventricular wall thickness. Right ventricular systolic function is  normal.   Left Atrium: Left atrial size was mildly dilated.   Right Atrium: Right atrial size was normal in size.   Pericardium: There is no evidence of pericardial effusion.   Mitral Valve: The mitral valve is normal in structure. Trivial mitral  valve regurgitation. No evidence of mitral valve stenosis.   Tricuspid Valve: The tricuspid valve is normal in structure. Tricuspid  valve regurgitation is mild . No evidence of tricuspid stenosis.   Aortic Valve: The aortic valve is normal in structure. Aortic valve  regurgitation is not visualized. Mild to moderate aortic valve  sclerosis/calcification is present, without any evidence of aortic  stenosis.   Pulmonic Valve: The pulmonic valve was normal in structure. Pulmonic valve  regurgitation is not visualized. No evidence of pulmonic stenosis.   Aorta: The aortic root is normal in size and structure.   Venous: The inferior vena cava is normal in size with greater than 50%  respiratory variability, suggesting right atrial pressure of 3 mmHg.   IAS/Shunts: No atrial level shunt detected by color flow Doppler.   Additional Comments: A pacer wire is visualized in the right ventricle.     Patient Profile      59 y.o. male with a hx of CAD, CHF(EF evaluation as low as 45%, but recently 60-65%)and VT s/p ICDwho presents with shock.  Echocardiogram this  admission showed ejection fraction 55 to 60%, mild left atrial enlargement.  Assessment & Plan    1 persistent atrial fibrillation-ERAF s/p DCCV 09/13/20 and given IV amio and repeat DCCV10/6/21 in SR today.     Continue apixaban.  Plan to continue amiodarone load  Note LV function normal.  TSH normal. On Eliquis ? Afford?  On amiodarone 400 BID, dilt 180,  Lopressor 25 BID   2 shock-this was felt likely secondary to hypervolemia/UTI/sepsis and has resolved.  Echocardiogram shows normal LV function.  3 coronary artery disease-plan to continue statin.  He is not on aspirin given need for apixaban.  4 history of ventricular tachycardia-no recent episodes.  ICD in place.  Keep Mg+ >2.0 and K+ >4.0   5 acute diastolic congestive heart failure-I/O since admission now -1749 and wt still elevated at 144.9 kg.  Hx of ICM, had been on entresto 97/103  Would consider adding back at lower dose with normalized renal failure and borderline HTN today due to dilt and BB-- his Ef is now normal.      ** he did receive 60 mg IV lasix today   6.  AKI now resolved, entresto held for this Cr 0.94 yesterday   BP 109/77 to 124/85        For questions or updates, please contact [place] Please consult [email or web address] for contact info under        Signed, Cecilie Kicks, NP  09/20/2020, 9:54 AM    Personally seen and examined. Agree with above.  Still with volume overload.  Maintaining sinus rhythm on amiodarone after his cardioversion yesterday.  Told me about his insurance woes.  Lower extremity edema noted.  No crackles.  Heart regular rate and rhythm.  Restarting low-dose Entresto.  On Lasix 60.  We will continue with increased amiodarone load of 400 mg twice daily for the next week.  Decrease to 200 mg twice a day for 2 weeks and then 200 mg a day  thereafter.  Candee Furbish, MD

## 2020-09-20 NOTE — Progress Notes (Addendum)
Mobility Specialist - Progress Note   09/20/20 1623  Mobility  Activity Transferred to/from [facility]  Level of Assistance Moderate assist, patient does 50-74%  Assistive Device Front wheel walker  Mobility Response Tolerated poorly  Mobility performed by Mobility specialist  $Mobility charge 1 Mobility    Pre-mobility: 96 HR On BSC, RA: 93 HR,132/85 BP, 89-93%SpO2  1 L O2: 96% SpO2 Post-mobility: 85 HR, 95% SpO2  While sitting up on BSC, pt c/o feeling out of breath, RN started him on 1 L/min of O2 via [place]. Pt required mod assist in order to stand. Says he would prefer for therapy to be done in the am as he fatigues throughout the day.   Pricilla Handler Mobility Specialist Mobility Specialist Phone: [phone]

## 2020-09-20 NOTE — Progress Notes (Signed)
PT Cancellation Note  Patient Details Name: Gregory [name] MRN: [id number] DOB: May 12, 1961   Cancelled Treatment:    Reason Eval/Treat Not Completed: Fatigue/lethargy limiting ability to participate. PT attempts to see pt twice on this date for progression of mobility however the pt refuses both times due to fatigue. Upon initial attempt pt sitting in recliner and reports transferring multiple times from bed-recliner-bedside commode this morning which resulted in significant fatigue. Pt requests PT to return later in the afternoon, upon which pt refused again despite PT encouragement. PT will attempt to follow up tomorrow.   Zenaida Niece 09/20/2020, 3:12 PM

## 2020-09-20 NOTE — TOC Initial Note (Addendum)
Transition of Care (TOC) - Initial/Assessment Note  Marvetta Gibbons RN, BSN Transitions of Care Unit 4E- RN Case Manager See Treatment Team for direct phone #    Patient Details  Name: Gregory [name] MRN: [id number] Date of Birth: 1961-10-26  Transition of Care [facility]) CM/SW Contact:    Dawayne Patricia, RN Phone Number: 09/20/2020, 4:31 PM  Clinical Narrative:                 Received referral regarding benefits check for Eliquis- pt is showing no insurance and no drug coverage- however when speaking to pt at bedside pt states he has insurance coverage through Workers Comp claim. Pt also reports that he has a Administrator, sports but that program does not have any "coverage for hospital stays".  Pt provided this Probation officer with his workers Set designer and gave permission to contact them to confirm coverage.  Workers Engineer, site [phone] Employee- patient Employer- [employer] #- [id number] Injury date- 05/29/2004  Call made to the workers comp provider  Verified that pt does still have an active claim for anything heart/vascular related.  Adjustor name: Verlene Mayer  Direct line- [phone] Fax- [phone]  Carlus Pavlov is out of the office this week and so they connected this Probation officer to [employer] who is covering, she indicated that they would need to verify that this admission was a heart related admission to provide auth for coverage- requested clinicals to be sent for review- will have UR team take care of this. Also asked Narda Rutherford about Eliquis coverage and this too would need to be reviewed- she states she will send auth form to be filled out by MD- this can then be faxed back in- Per Janie once clinicals sent in will check back in to verify coverage and go over what else may be needed- Once coverage is verified will ask to see if INPT rehab might be covered under the WC coverage.  Janie's contact #[phone]  Per pt  he uses [facility] [address] to fill scripts  Spoke with Jolene Provost. In UR regarding need to send clinicals to [facility] provider- she will do this once admissions adds WC to epic account- this writer has already spoken to admissions to have info added until [facility] can be verified.   Expected Discharge Plan: IP Rehab Facility Barriers to Discharge: Continued Medical Work up   Patient Goals and CMS Choice Patient states their goals for this hospitalization and ongoing recovery are:: rehab then return home      Expected Discharge Plan and Services Expected Discharge Plan: [place]   Discharge Planning Services: CM Consult, Medication Assistance Post Acute Care Choice: IP Rehab Living arrangements for the past 2 months: Single Family Home                                      Prior Living Arrangements/Services Living arrangements for the past 2 months: Single Family Home Lives with:: Relatives Patient language and need for interpreter reviewed:: Yes Do you feel safe going back to the place where you live?: Yes      Need for Family Participation in Patient Care: Yes (Comment) Care giver support system in place?: Yes (comment) Current home services: DME Criminal Activity/Legal Involvement Pertinent to Current Situation/Hospitalization: No - Comment as needed  Activities of Daily Living Home Assistive Devices/Equipment: None ADL Screening (condition  at time of admission) Patient's cognitive ability adequate to safely complete daily activities?: Yes Is the patient deaf or have difficulty hearing?: No Does the patient have difficulty seeing, even when wearing glasses/contacts?: No Does the patient have difficulty concentrating, remembering, or making decisions?: No Patient able to express need for assistance with ADLs?: Yes Does the patient have difficulty dressing or bathing?: No Independently performs ADLs?: Yes (appropriate for developmental age) Does the patient  have difficulty walking or climbing stairs?: Yes Weakness of Legs: Both Weakness of Arms/Hands: None  Permission Sought/Granted Permission sought to share information with : Other (comment), Case Manager (worker's comp) Permission granted to share information with : Yes, Verbal Permission Granted     Permission granted to share info w AGENCY: [employer]        Emotional Assessment Appearance:: Appears stated age Attitude/Demeanor/Rapport: Engaged Affect (typically observed): Appropriate Orientation: : Oriented to Self, Oriented to Place, Oriented to  Time, Oriented to Situation Alcohol / Substance Use: Not Applicable Psych Involvement: No (comment)  Admission diagnosis:  Hypocalcemia [E83.51] CHF (congestive heart failure) (HCC) [I50.9] Hyponatremia [E87.1] Shock ([place]) [R57.9] Acute renal failure, unspecified acute renal failure type ([place]) [N17.9] Patient Active Problem List   Diagnosis Date Noted  . Shock ([place]) 09/13/2020  . Thrombocytopenia ([place])   . CHF (congestive heart failure) ([place]) 09/09/2020  . Atrial fibrillation with rapid ventricular response ([place])   . Localization-related idiopathic epilepsy and epileptic syndromes with seizures of localized onset, not intractable, without status epilepticus ([place]) 10/20/2018  . Seizure disorder ([place]) 06/07/2018  . Anemia 06/07/2018  . Ventricular tachycardia ([place]) 05/04/2016  . Ischemic cardiomyopathy 12/01/2013  . Chronic combined systolic and diastolic heart failure ([place]) 12/01/2013  . CAD S/P percutaneous coronary angioplasty 12/01/2013  . Erectile dysfunction 12/01/2013  . Essential hypertension 12/01/2013  . Hypercholesterolemia 12/01/2013  . ICD - dual chamber [employer] programmed VVI 12/01/2013  . OSA on CPAP 12/01/2013   PCP:  Patient, No Pcp Per Pharmacy:   [facility] [place], [place] - [address] AT [place] [address] Phone:  [phone] Fax: [facility] [address], [place] [address] Phone: [phone] Fax: [phone]     Social Determinants of Health (SDOH) Interventions    Readmission Risk Interventions No flowsheet data found.

## 2020-09-20 NOTE — Progress Notes (Addendum)
Electrophysiology Rounding Note  Patient Name: Gregory [name] Date of Encounter: 09/20/2020  Primary Cardiologist: Sanda Klein, MD Electrophysiologist: Dr. Caryl Comes   Subjective   Feeling Ok. Asking about fluid medications and his Entresto. Very limited mobility. He is being worked up for [employer]  Inpatient Medications    Scheduled Meds: . amiodarone  400 mg Oral BID  . apixaban  5 mg Oral BID  . atorvastatin  80 mg Oral Daily  . cephALEXin  500 mg Oral Q8H  . Chlorhexidine Gluconate Cloth  6 each Topical Daily  . diltiazem  180 mg Oral Daily  . docusate sodium  100 mg Oral BID  . insulin aspart  0-20 Units Subcutaneous TID WC  . insulin aspart  0-5 Units Subcutaneous QHS  . levETIRAcetam  500 mg Oral BID  . metoprolol tartrate  25 mg Oral BID  . pantoprazole  40 mg Oral QHS  . polyethylene glycol  17 g Oral Daily   Continuous Infusions:  PRN Meds: acetaminophen, metoprolol tartrate, traZODone   Vital Signs    Vitals:   09/19/20 2356 09/20/20 0400 09/20/20 0436 09/20/20 0818  BP: (!) 140/91 124/85    Pulse: 84 83  86  Resp: 16 14  16   Temp: 98.5 F (36.9 C) 97.7 F (36.5 C)    TempSrc: Axillary     SpO2: 96% 97%  95%  Weight:   (!) 144.9 kg   Height:        Intake/Output Summary (Last 24 hours) at 09/20/2020 0835 Last data filed at 09/20/2020 0441 Gross per 24 hour  Intake 200 ml  Output 1000 ml  Net -800 ml   Filed Weights   09/18/20 0631 09/19/20 0413 09/20/20 0436  Weight: (!) 144.9 kg (!) 143.4 kg (!) 144.9 kg    Physical Exam    GEN- The patient is well appearing, alert and oriented x 3 today.   Head- normocephalic, atraumatic Eyes-  Sclera clear, conjunctiva pink Ears- hearing intact Oropharynx- clear Neck- supple Lungs- Clear to ausculation bilaterally, normal work of breathing Heart- Regular rate and rhythm, no murmurs, rubs or gallops GI- soft, NT, ND, + BS Extremities- no clubbing or cyanosis. No edema Skin- no rash or  lesion Psych- euthymic mood, full affect Neuro- strength and sensation are intact  Labs    CBC Recent Labs    09/18/20 0500 09/19/20 0415  WBC 14.4* 12.4*  NEUTROABS 13.5* 10.6*  HGB 11.1* 10.8*  HCT 34.2* 32.9*  MCV 104.0* 104.1*  PLT 203 950   Basic Metabolic Panel Recent Labs    09/18/20 0500 09/19/20 0415  NA 135 135  K 4.1 4.6  CL 100 99  CO2 28 28  GLUCOSE 197* 185*  BUN 21 14  CREATININE 1.00 0.94  CALCIUM 7.8* 7.8*  MG 2.0  --    Liver Function Tests No results for input(s): AST, ALT, ALKPHOS, BILITOT, PROT, ALBUMIN in the last 72 hours. No results for input(s): LIPASE, AMYLASE in the last 72 hours. Cardiac Enzymes No results for input(s): CKTOTAL, CKMB, CKMBINDEX, TROPONINI in the last 72 hours.   Telemetry    NSR 80s (personally reviewed)  Radiology    No results found.  Patient Profile     59 y.o.malew/PMHx of CAD (knonw chronic occl of RCA with prior PCIs to LAD, diag, and LCx), VT, ICM, OSA w/CPAP, seizures, chronic CHF, HLD, HTN, obesity  admitted to [facility]/26 with progressive weakness, orthostatic dizziness, and a syncopal event, He was  markedly hypotensive, started on IVF with agressive replacement as well a levophed, felt to be in shock (unceratin etiology septic vs hypovolemic) also noted to be in new AFib  Assessment & Plan    1. New Afib in setting of shock Rates improved now taking po meds He had ERAF s/p DCC last week, now holding NSR s/p [facility] 10/6 after further amio load.  Continue amio, po dilt, and eliquis.  No insurance, may be difficult to continue Eliquis long term.CHA2DS2Vasc is 3.   2. Shock Afebrile. WBC continues to trend down.  BCdrawn 10/3 NGTD  Much improved. Now more volume overloaded.   3. AKI Resolved Entresto held in this setting. Will ask Team A to take back over. May consider resuming at lower dose.   4. CAD with prior PCI Echo 09/10/2020 LVEF 55-60% No s/s ischemia at this time.   5. Hx of  VT Has MDT ICD Increased PVCs PreservedEFEcho this admit.  Keep K > 4.0 and Mg > 2.0   6. CHF Volume status elevated, and now in NSR.  Will give 60 mg IV lasix x 1, and ask gen cards to take back over for further diuresis and medication adjustment.   Echo 09/10/2020 LVEF 55-60%  EP outpatient follow up has been made for the 3-4 week mark with APP.   For questions or updates, please contact [place] Please consult [email or web address] for contact info under Cardiology/STEMI.  Signed, Shirley Friar, PA-C  09/20/2020, 8:35 AM

## 2020-09-20 NOTE — Progress Notes (Signed)
Inpatient Rehab Admissions:  Inpatient Rehab Consult received.  I met with patient at the bedside for rehabilitation assessment and to discuss goals and expectations of an inpatient rehab admission.  RN CM working on verifying worker's comp coverage.  Pt open to CIR and feels that his legs are still very week.  He lives at home with his son, who works from home and can provide supervision if needed.  Will continue to follow for timing of possible rehab admission pending progress with functional mobility, bed availability, and clarification of payor source.   Signed: Shann Medal, PT, DPT Admissions Coordinator [phone] 09/20/20  12:58 PM

## 2020-09-21 DIAGNOSIS — I5031 Acute diastolic (congestive) heart failure: Secondary | ICD-10-CM

## 2020-09-21 LAB — BASIC METABOLIC PANEL
Anion gap: 9 (ref 5–15)
BUN: 11 mg/dL (ref 8–23)
CO2: 28 mmol/L (ref 22–32)
Calcium: 7.8 mg/dL — ABNORMAL LOW (ref 8.9–10.3)
Chloride: 99 mmol/L (ref 98–111)
Creatinine, Ser: 0.88 mg/dL (ref 0.61–1.24)
GFR calc non Af Amer: >60 mL/min (ref >60)
Glucose, Bld: 195 mg/dL — ABNORMAL HIGH (ref 70–99)
Potassium: 4.4 mmol/L (ref 3.5–5.1)
Sodium: 136 mmol/L (ref 135–145)

## 2020-09-21 LAB — HEMOGLOBIN A1C
Hgb A1c MFr Bld: 6.9 % — ABNORMAL HIGH (ref 4.8–5.6)
Mean Plasma Glucose: 151.33 mg/dL

## 2020-09-21 LAB — CULTURE, BLOOD (ROUTINE X 2)
Culture: NO GROWTH
Culture: NO GROWTH

## 2020-09-21 LAB — GLUCOSE, CAPILLARY
Glucose-Capillary: 186 mg/dL — ABNORMAL HIGH (ref 70–99)
Glucose-Capillary: 299 mg/dL — ABNORMAL HIGH (ref 70–99)
Glucose-Capillary: 317 mg/dL — ABNORMAL HIGH (ref 70–99)
Glucose-Capillary: 253 mg/dL — ABNORMAL HIGH (ref 70–99)
Glucose-Capillary: 155 mg/dL — ABNORMAL HIGH (ref 70–99)

## 2020-09-21 MED ORDER — FUROSEMIDE 10 MG/ML IJ SOLN
40.0000 mg | Freq: Once | INTRAMUSCULAR | Status: AC
  Administered 2020-09-21: 40 mg via INTRAVENOUS
  Filled 2020-09-21: qty 4

## 2020-09-21 MED ORDER — INSULIN DETEMIR 100 UNIT/ML ~~LOC~~ SOLN
5.0000 [IU] | Freq: Two times a day (BID) | SUBCUTANEOUS | Status: DC
  Administered 2020-09-21 – 2020-09-29 (×17): 5 [IU] via SUBCUTANEOUS
  Filled 2020-09-21 (×18): qty 0.05

## 2020-09-21 MED ORDER — ATORVASTATIN CALCIUM 80 MG PO TABS
80.0000 mg | ORAL_TABLET | Freq: Every day | ORAL | Status: DC
  Administered 2020-09-21 – 2020-09-28 (×8): 80 mg via ORAL
  Filled 2020-09-21 (×8): qty 1

## 2020-09-21 MED ORDER — FUROSEMIDE 10 MG/ML IJ SOLN
60.0000 mg | Freq: Once | INTRAMUSCULAR | Status: AC
  Administered 2020-09-21: 60 mg via INTRAVENOUS
  Filled 2020-09-21: qty 6

## 2020-09-21 NOTE — Progress Notes (Signed)
Inpatient Rehab Admissions Coordinator:   Met with pt at bedside to update.  I will submit auth form to workers comp today for [employer] prior auth. Will not likely have a bed for pt before Monday, and note still volume overloaded.   Shann Medal, PT, DPT Admissions Coordinator [phone] 09/21/20  12:43 PM

## 2020-09-21 NOTE — Progress Notes (Signed)
Physical Therapy Treatment Patient Details Name: Gregory [name] MRN: [id number] DOB: August 04, 1961 Today's Date: 09/21/2020    History of Present Illness Gregory [name] is an 59 y.o. male past medical history of coronary artery disease, chronic diastolic heart failure, ischemic cardiomyopathy essential hypertension status post cardiac defibrillation who initially presented to the ED with nausea vomiting and diarrhea was found to be in acute kidney injury requiring vasopressors initially admitted to the ICU infectious work-up turned out to be negative.  He then developed A. fib with RVR cardiology was consulted.Procedure  DC Cardioversion 10/6.    PT Comments    Pt limited this session by tachycardia and fatigue. Pt demonstrates improved transfer technique although continues to require significant assistance to stand from low surfaces. Pt demonstrates improved LE strength for ambulation, but is limited by significant increase in HR with activity. Pt encourages the pt to participate in LE HEP as well as transferring with nursing staff and spending increased time out of the bed during the day. Pt will continue to benefit from acute PT services to improve activity tolerance and transfer quality in an effort to restore independence. PT continues to recommend CIR at this time.  Follow Up Recommendations  CIR     Equipment Recommendations  Rolling walker with 5" wheels;3in1 (PT)    Recommendations for Other Services       Precautions / Restrictions Precautions Precautions: Fall Restrictions Weight Bearing Restrictions: No    Mobility  Bed Mobility Overal bed mobility: Needs Assistance Bed Mobility: Sit to Supine       Sit to supine: Min assist   General bed mobility comments: minA from LE management  Transfers Overall transfer level: Needs assistance Equipment used: Rolling walker (2 wheeled) Transfers: Sit to/from Stand Sit to Stand: Mod assist Stand pivot  transfers: Mod assist       General transfer comment: cues for hand placement, forward lean. Pt requires assistance to power up from lower surfaces  Ambulation/Gait Ambulation/Gait assistance: Min assist Gait Distance (Feet): 20 Feet Assistive device: Rolling walker (2 wheeled) Gait Pattern/deviations: Step-to pattern Gait velocity: reduced Gait velocity interpretation: <1.8 ft/sec, indicate of risk for recurrent falls General Gait Details: pt with short step to gait, reduced step length and foot clearance   Stairs             Wheelchair Mobility    Modified Rankin (Stroke Patients Only)       Balance Overall balance assessment: Needs assistance Sitting-balance support: No upper extremity supported;Feet supported Sitting balance-Leahy Scale: Good     Standing balance support: Bilateral upper extremity supported Standing balance-Leahy Scale: Poor Standing balance comment: reliant on BUE support and minG                            Cognition Arousal/Alertness: Awake/alert Behavior During Therapy: WFL for tasks assessed/performed Overall Cognitive Status: Within Functional Limits for tasks assessed                                        Exercises General Exercises - Lower Extremity Ankle Circles/Pumps: AROM;Both;10 reps Heel Slides: AROM;Both;5 reps Straight Leg Raises: AROM;Both;5 reps Other Exercises Other Exercises: bridges x2 reps    General Comments General comments (skin integrity, edema, etc.): pt HR in low 100s at rest. With mobility pt HR increases to 120s with initial tansfer and momentarily  up to a max HR of 220 when completing ambulation and retuning to seated position at edge of bed. HR declines from 220 quickly and returns to 120-130s within 20 seconds of seated rest break and deep breathing. Pt does report symptoms of SOB and some lightheadedness. PT assists pt in return to bed, RN present and aware.      Pertinent  Vitals/Pain Pain Assessment: No/denies pain    Home Living                      Prior Function            PT Goals (current goals can now be found in the care plan section) Acute Rehab PT Goals Patient Stated Goal: Needs to strengthen my legs, wants to walk again. Progress towards PT goals: Progressing toward goals (slowly 2/2 fatigue and tachycardia)    Frequency    Min 3X/week      PT Plan Current plan remains appropriate    Co-evaluation              AM-PAC PT "6 Clicks" Mobility   Outcome Measure  Help needed turning from your back to your side while in a flat bed without using bedrails?: A Little Help needed moving from lying on your back to sitting on the side of a flat bed without using bedrails?: A Little Help needed moving to and from a bed to a chair (including a wheelchair)?: A Little Help needed standing up from a chair using your arms (e.g., wheelchair or bedside chair)?: A Lot Help needed to walk in hospital room?: A Lot Help needed climbing 3-5 steps with a railing? : Total 6 Click Score: 14    End of Session   Activity Tolerance: Patient limited by fatigue;Treatment limited secondary to medical complications (Comment) (tachycardia) Patient left: in bed;with call bell/phone within reach;with bed alarm set Nurse Communication: Mobility status PT Visit Diagnosis: Other abnormalities of gait and mobility (R26.89);Muscle weakness (generalized) (M62.81)     Time: 4540-9811 PT Time Calculation (min) (ACUTE ONLY): 19 min  Charges:  $Therapeutic Activity: 8-22 mins                     Zenaida Niece, PT, DPT Acute Rehabilitation Pager: [phone]    Zenaida Niece 09/21/2020, 11:36 AM

## 2020-09-21 NOTE — Progress Notes (Signed)
Submitted auth form RFA to workers comp for [employer] need per [employer] with Workers Comp this  will likely need to go to Utilization Review before we know if/the extent of the treatment requested is certified.   CIR will also need to submit an RFA form for INPT rehab request- info has been shared with [facility]. Including needed form to submit.

## 2020-09-21 NOTE — Progress Notes (Signed)
OT Cancellation Note  Patient Details Name: Hogan Hoobler MRN: [id number] DOB: 08-20-1961   Cancelled Treatment:    Reason Eval/Treat Not Completed: Fatigue/lethargy limiting ability to participate.  Continue efforts as appropriate.  Zilphia Kozinski D Kristian Hazzard 09/21/2020, 1:48 PM  09/21/2020  Rich, OTR/L  Acute Rehabilitation Services  Office:  [phone]

## 2020-09-21 NOTE — Progress Notes (Signed)
Mobility Specialist - Progress Note   09/21/20 1344  Mobility  Activity Refused mobility    Pt refused mobility as he his fatigued from PT earlier. He stated he nearly lost consciousness while ambulating this am.    Pricilla Handler Mobility Specialist Mobility Specialist Phone: [phone]

## 2020-09-21 NOTE — Progress Notes (Signed)
Progress Note  Patient Name: Gregory [name] Date of Encounter: 09/21/2020  [facility] Cardiologist: Sanda Klein, MD   Subjective   No chest pain, no shortness of breath.  Still has marked fluid overload volume  Inpatient Medications    Scheduled Meds: . amiodarone  400 mg Oral BID  . apixaban  5 mg Oral BID  . atorvastatin  80 mg Oral Daily  . Chlorhexidine Gluconate Cloth  6 each Topical Daily  . diltiazem  180 mg Oral Daily  . docusate sodium  100 mg Oral BID  . insulin aspart  0-20 Units Subcutaneous TID WC  . insulin aspart  0-5 Units Subcutaneous QHS  . levETIRAcetam  500 mg Oral BID  . metoprolol tartrate  25 mg Oral BID  . pantoprazole  40 mg Oral QHS  . polyethylene glycol  17 g Oral Daily  . sacubitril-valsartan  1 tablet Oral BID   Continuous Infusions:  PRN Meds: acetaminophen, metoprolol tartrate, traZODone   Vital Signs    Vitals:   09/21/20 0006 09/21/20 0446 09/21/20 0452 09/21/20 0801  BP: 117/78 113/74    Pulse: 84 85    Resp: 15     Temp: 97.8 F (36.6 C) (!) 97.5 F (36.4 C)    TempSrc: Oral Oral    SpO2: 95% 92%  93%  Weight:   (!) 139.8 kg   Height:        Intake/Output Summary (Last 24 hours) at 09/21/2020 0809 Last data filed at 09/21/2020 0450 Gross per 24 hour  Intake 480 ml  Output 2525 ml  Net -2045 ml   Last 3 Weights 09/21/2020 09/20/2020 09/19/2020  Weight (lbs) 308 lb 3.3 oz 319 lb 7.1 oz 316 lb 2.2 oz  Weight (kg) 139.8 kg 144.9 kg 143.4 kg      Telemetry    Sinus rhythm- Personally Reviewed  ECG    Sinus rhythm- Personally Reviewed  Physical Exam   GEN: No acute distress.  Left eye scleral mass noted. Neck: No JVD Cardiac: RRR, no murmurs, rubs, or gallops.  Respiratory: Clear to auscultation bilaterally. GI: Soft, nontender, non-distended  MS:  2+ lower extremity edema; No deformity. Neuro:  Nonfocal  Psych: Normal affect   Labs    High Sensitivity Troponin:   Recent Labs  Lab  09/09/20 1336  TROPONINIHS 48*      Chemistry Recent Labs  Lab 09/16/20 0601 09/16/20 0601 09/17/20 0544 09/17/20 0544 09/18/20 0500 09/19/20 0415 09/21/20 0445  NA 138   < > 140   < > 135 135 136  K 4.6   < > 4.6   < > 4.1 4.6 4.4  CL 102   < > 103   < > 100 99 99  CO2 28   < > 29   < > 28 28 28   GLUCOSE 190*   < > 141*   < > 197* 185* 195*  BUN 20   < > 22   < > 21 14 11   CREATININE 1.18   < > 1.10   < > 1.00 0.94 0.88  CALCIUM 8.3*   < > 8.2*   < > 7.8* 7.8* 7.8*  GFRNONAA >60   < > >60   < > >60 >60 >60  GFRAA >60  --  >60  --  >60  --   --   ANIONGAP 8   < > 8   < > 7 8 9    < > = values  in this interval not displayed.     Hematology Recent Labs  Lab 09/17/20 0630 09/17/20 0630 09/18/20 0500 09/19/20 0415 09/19/20 1022  WBC 17.8*  --  14.4* 12.4*  --   RBC 3.53*   < > 3.29* 3.16* 3.29*  HGB 11.8*  --  11.1* 10.8*  --   HCT 37.1*  --  34.2* 32.9*  --   MCV 105.1*  --  104.0* 104.1*  --   MCH 33.4  --  33.7 34.2*  --   MCHC 31.8  --  32.5 32.8  --   RDW 18.5*  --  18.4* 18.4*  --   PLT 187  --  203 199  --    < > = values in this interval not displayed.    BNPNo results for input(s): BNP, PROBNP in the last 168 hours.   DDimer No results for input(s): DDIMER in the last 168 hours.   Radiology    No results found.  Cardiac Studies   Echo EF 60%  Patient Profile     59 y.o. male with acute on chronic diastolic heart failure, prior ventricular tachycardia with ICD, paroxysmal atrial fibrillation status post cardioversion on 10/6 with the assistance of amiodarone.  Assessment & Plan    Persistent atrial fibrillation -History includes early return of atrial fibrillation after cardioversion on 9/30, given amiodarone load and repeat cardioversion on 09/19/2020 in sinus rhythm now.  Continuing with Eliquis for anticoagulation.  Amiodarone load.  TSH normal.  Case management consulted for concerns over cost of medication.  He is also on diltiazem 180 and  Lopressor 25 twice daily. -On discharge give amiodarone 200 mg twice daily for the next 2 weeks and then 200 mg a day thereafter.  Septic shock -Resolved, UTI.  Normal LV function.  CAD -Continue with secondary risk factor modification, statin.  Since stable, not on aspirin.  Continue with Eliquis.  VT status post ICD -Continue to maintain good electrolyte balance.  Acute on chronic diastolic heart failure -Excellent diuresis yesterday, 2 L net out.  We have added back Entresto, lower dose. -Creatinine 0.88 potassium 4.4 -Most recent blood pressure 113/74 -Weight was 280 pounds on 09/12/2020.  Currently 308 pounds.  Continue with diuresis.  I will give another dose of Lasix IV 60 mg today.  Continue with Entresto as well.     For questions or updates, please contact [place] Please consult [email or web address] for contact info under        Signed, Candee Furbish, MD  09/21/2020, 8:09 AM

## 2020-09-21 NOTE — Progress Notes (Signed)
[facility] PROGRESS NOTE    Progress Note  Sahir Tolson  NGE:[id number] DOB: [birth year] DOA: 09/09/2020 PCP: Patient, No Pcp Per     Brief Narrative:   Gregory [name] is an 59 y.o. male past medical history of coronary artery disease, chronic diastolic heart failure, ischemic cardiomyopathy essential hypertension status post cardiac defibrillation who initially presented to the ED with nausea vomiting and diarrhea was found to be in acute kidney injury requiring vasopressors initially admitted to the ICU infectious work-up turned out to be negative.  He then developed A. 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 EP was consulted and recommended DCCV on 09/19/2020. Patient did develop acute confusion, leukocytosis and was suspected to be in sepsis due to UTI started empirically on antibiotics.  Assessment/Plan:   Atrial fibrillation with rapid ventricular response [facility]): CHADS Vasc score greater than 4 Heart rate is about 80-101, he is currently on amiodarone load 200 mg p.o. twice daily for 10 days continue metoprolol and Cardizem he has successfully been cardioverted on a second attempt. He is currently on Eliquis which continue as an outpatient. Awaiting inpatient rehab consult. Patient is medically stable to be transferred to inpatient rehab facility.  Acute confusional state/acute encephalopathy: Likely due to infectious etiology.  CT unremarkable unable to do MRI due to AICD. His mental status has been improving slowly has been neurologically intact.  Severe sepsis due to UTI: He was started on IV Rocephin his encephalopathy resolved. He will complete his course of oral antibiotics in house. Culture data has remained negative till date.  Acute on chronic diastolic heart  failure: Back in September his weight was 1 29 to 135 kg now he is weighing 139 which is down from 144. He was started on IV Lasix, monitor electrolytes and replete as needed. Follow strict I's and O's and daily weights, according to chart he is 10 L positive.  Acute kidney injury: Likely prerenal in the setting of A. fib with RVR with a baseline less than 1. Resolved with IV fluid hydration.    Thrombocytopenia: After starting heparin head bodies were negative there is no evidence of bleeding after stopping heparin his platelets are improving.  Hypocalcemia: Was giving calcium gluconate now resolved.  Coronary artery disease: Status post AICD, denies any chest pain or shortness of breath.  Macrocytic anemia: Folate of 300, B12 was 683. Check an anemia panel.  Chronic combined systolic and diastolic heart failure (HCC) Appears euvolemic on physical exam KVO IV fluids last echo showed an EF of 55%.  Obstructive sleep apnea: Continue CPAP at night.  Mild transaminitis: Abdominal ultrasound was negative for acute cholecystitis he denies any abdominal pain. Likely cardiovascular mediated now resolved.  Newly diagnosed diabetes mellitus type 2: Check an A1c he was started on long-acting insulin plus sliding scale we will increase long-acting insulin his blood glucose not controlled.  Seizures: Continue Keppra.   DVT prophylaxis: Eliquis Family Communication:none Status is: Inpatient  Remains inpatient appropriate because:Hemodynamically unstable   Dispo: The patient is from: Home              Anticipated d/c is to: CIR              Anticipated d/c date is: 2 days              Patient currently patient is medically stable transferred  to CIR.  Awaiting CIR recommendations.        Code Status:     Code Status Orders  (From admission, onward)         Start     Ordered   09/09/20 1642  Full code  Continuous        09/09/20 1643        Code Status History     Date Active Date Inactive Code Status Order ID Comments User Context   06/07/2018 2227 06/09/2018 1730 Full Code [id number]  Jani Gravel, MD Inpatient   12/19/2016 1651 12/19/2016 2113 Full Code [id number]  [name], MD Inpatient   05/04/2016 2100 05/07/2016 2158 Full Code [id number]  Jerline Pain, MD Inpatient   Advance Care Planning Activity        IV Access:    Peripheral IV   Procedures and diagnostic studies:   No results found.   Medical Consultants:    None.  Anti-Infectives:   keflex  Subjective:    Gregory [name] relates he feels great no new complaints unsteady on his feet.  Objective:    Vitals:   09/21/20 0006 09/21/20 0446 09/21/20 0452 09/21/20 0801  BP: 117/78 113/74    Pulse: 84 85    Resp: 15     Temp: 97.8 F (36.6 C) (!) 97.5 F (36.4 C)    TempSrc: Oral Oral    SpO2: 95% 92%  93%  Weight:   (!) 139.8 kg   Height:       SpO2: 93 % O2 Flow Rate (L/min): 2 L/min   Intake/Output Summary (Last 24 hours) at 09/21/2020 0929 Last data filed at 09/21/2020 0450 Gross per 24 hour  Intake 240 ml  Output 2525 ml  Net -2285 ml   Filed Weights   09/19/20 0413 09/20/20 0436 09/21/20 0452  Weight: (!) 143.4 kg (!) 144.9 kg (!) 139.8 kg    Exam: General exam: In no acute distress. Respiratory system: Good air movement and clear to auscultation. Cardiovascular system: S1 & S2 heard, RRR. No JVD. Gastrointestinal system: Abdomen is nondistended, soft and nontender.  Extremities: No pedal edema. Skin: No rashes, lesions or ulcers  Data Reviewed:    Labs: Basic Metabolic Panel: Recent Labs  Lab 09/15/20 0428 09/15/20 0428 09/16/20 0601 09/16/20 0601 09/17/20 0544 09/17/20 0544 09/18/20 0500 09/18/20 0500 09/19/20 0415 09/21/20 0445  NA 136   < > 138  --  140  --  135  --  135 136  K 4.3   < > 4.6   < > 4.6   < > 4.1   < > 4.6 4.4  CL 101   < > 102  --  103  --  100  --  99 99  CO2 28   < > 28  --  29  --  28  --  28  28  GLUCOSE 172*   < > 190*  --  141*  --  197*  --  185* 195*  BUN 19   < > 20  --  22  --  21  --  14 11  CREATININE 1.17   < > 1.18  --  1.10  --  1.00  --  0.94 0.88  CALCIUM 8.2*   < > 8.3*  --  8.2*  --  7.8*  --  7.8* 7.8*  MG 2.4  --  2.3  --  2.1  --  2.0  --   --   --  PHOS 1.8*  --   --   --   --   --   --   --   --   --    < > = values in this interval not displayed.   GFR Estimated Creatinine Clearance: 138.2 mL/min (by C-G formula based on SCr of 0.88 mg/dL). Liver Function Tests: No results for input(s): AST, ALT, ALKPHOS, BILITOT, PROT, ALBUMIN in the last 168 hours. No results for input(s): LIPASE, AMYLASE in the last 168 hours. Recent Labs  Lab 09/16/20 0951  AMMONIA 17   Coagulation profile Recent Labs  Lab 09/19/20 0546  INR 1.5*   COVID-19 Labs  Recent Labs    09/19/20 1021  FERRITIN 1,062*    Lab Results  Component Value Date   SARSCOV2NAA NEGATIVE 09/09/2020   [place] Not Detected 12/05/2019    CBC: Recent Labs  Lab 09/15/20 0428 09/16/20 0820 09/17/20 0630 09/18/20 0500 09/19/20 0415  WBC 19.5* 24.5* 17.8* 14.4* 12.4*  NEUTROABS  --   --   --  13.5* 10.6*  HGB 13.1 12.4* 11.8* 11.1* 10.8*  HCT 40.2   37.5 39.6 37.1* 34.2* 32.9*  MCV 104.1* 105.6* 105.1* 104.0* 104.1*  PLT 155 167 187 203 199   Cardiac Enzymes: No results for input(s): CKTOTAL, CKMB, CKMBINDEX, TROPONINI in the last 168 hours. BNP (last 3 results) No results for input(s): PROBNP in the last 8760 hours. CBG: Recent Labs  Lab 09/20/20 0612 09/20/20 1112 09/20/20 1626 09/20/20 2215 09/21/20 0612  GLUCAP 186* 202* 228* 227* 186*   D-Dimer: No results for input(s): DDIMER in the last 72 hours. Hgb A1c: No results for input(s): HGBA1C in the last 72 hours. Lipid Profile: No results for input(s): CHOL, HDL, LDLCALC, TRIG, CHOLHDL, LDLDIRECT in the last 72 hours. Thyroid function studies: No results for input(s): TSH, T4TOTAL, T3FREE, THYROIDAB in the last  72 hours.  Invalid input(s): FREET3 Anemia work up: Recent Labs    09/19/20 1021 09/19/20 1022  FERRITIN 1,062*  --   TIBC 153*  --   IRON 27*  --   RETICCTPCT  --  2.1   Sepsis Labs: Recent Labs  Lab 09/16/20 0820 09/17/20 0630 09/18/20 0500 09/19/20 0415  WBC 24.5* 17.8* 14.4* 12.4*   Microbiology Recent Results (from the past 240 hour(s))  Culture, blood (Routine X 2) w Reflex to ID Panel     Status: None   Collection Time: 09/16/20 10:10 AM   Specimen: BLOOD RIGHT HAND  Result Value Ref Range Status   Specimen Description BLOOD RIGHT HAND  Final   Special Requests   Final    BOTTLES DRAWN AEROBIC ONLY Blood Culture results may not be optimal due to an inadequate volume of blood received in culture bottles   Culture   Final    NO GROWTH 5 DAYS Performed at [facility], [place] [address] [place], [place] [address]    Report Status 09/21/2020 FINAL  Final  Culture, blood (Routine X 2) w Reflex to ID Panel     Status: None   Collection Time: 09/16/20 10:10 AM   Specimen: BLOOD RIGHT HAND  Result Value Ref Range Status   Specimen Description BLOOD RIGHT HAND  Final   Special Requests   Final    BOTTLES DRAWN AEROBIC ONLY Blood Culture results may not be optimal due to an inadequate volume of blood received in culture bottles   Culture   Final    NO GROWTH 5 DAYS Performed at  [facility], [place] [address] [place], [place] [address]    Report Status 09/21/2020 FINAL  Final     Medications:    amiodarone  400 mg Oral BID   apixaban  5 mg Oral BID   atorvastatin  80 mg Oral Daily   Chlorhexidine Gluconate Cloth  6 each Topical Daily   diltiazem  180 mg Oral Daily   docusate sodium  100 mg Oral BID   furosemide  60 mg Intravenous Once   insulin aspart  0-20 Units Subcutaneous TID WC   insulin aspart  0-5 Units Subcutaneous QHS   levETIRAcetam  500 mg Oral BID   metoprolol tartrate  25 mg Oral BID   pantoprazole  40 mg Oral QHS    polyethylene glycol  17 g Oral Daily   sacubitril-valsartan  1 tablet Oral BID   Continuous Infusions:    LOS: 12 days   Charlynne Cousins  [facility]  09/21/2020, 9:29 AM

## 2020-09-22 ENCOUNTER — Inpatient Hospital Stay (HOSPITAL_COMMUNITY): Payer: Worker's Compensation

## 2020-09-22 DIAGNOSIS — I472 Ventricular tachycardia: Secondary | ICD-10-CM

## 2020-09-22 DIAGNOSIS — Z9861 Coronary angioplasty status: Secondary | ICD-10-CM

## 2020-09-22 LAB — BASIC METABOLIC PANEL
Anion gap: 10 (ref 5–15)
BUN: 12 mg/dL (ref 8–23)
CO2: 29 mmol/L (ref 22–32)
Calcium: 7.8 mg/dL — ABNORMAL LOW (ref 8.9–10.3)
Chloride: 98 mmol/L (ref 98–111)
Creatinine, Ser: 0.89 mg/dL (ref 0.61–1.24)
GFR, Estimated: >60 mL/min (ref >60)
Glucose, Bld: 148 mg/dL — ABNORMAL HIGH (ref 70–99)
Potassium: 4.3 mmol/L (ref 3.5–5.1)
Sodium: 137 mmol/L (ref 135–145)

## 2020-09-22 LAB — BASIC METABOLIC PANEL WITH GFR
Anion gap: 11 (ref 5–15)
BUN: 12 mg/dL (ref 8–23)
CO2: 28 mmol/L (ref 22–32)
Calcium: 7.8 mg/dL — ABNORMAL LOW (ref 8.9–10.3)
Chloride: 97 mmol/L — ABNORMAL LOW (ref 98–111)
Creatinine, Ser: 0.99 mg/dL (ref 0.61–1.24)
GFR, Estimated: >60 mL/min
Glucose, Bld: 199 mg/dL — ABNORMAL HIGH (ref 70–99)
Potassium: 4.3 mmol/L (ref 3.5–5.1)
Sodium: 136 mmol/L (ref 135–145)

## 2020-09-22 LAB — TROPONIN I (HIGH SENSITIVITY)
Troponin I (High Sensitivity): 18 ng/L — ABNORMAL HIGH (ref <18)
Troponin I (High Sensitivity): 25 ng/L — ABNORMAL HIGH (ref <18)

## 2020-09-22 LAB — GLUCOSE, CAPILLARY
Glucose-Capillary: 178 mg/dL — ABNORMAL HIGH (ref 70–99)
Glucose-Capillary: 211 mg/dL — ABNORMAL HIGH (ref 70–99)
Glucose-Capillary: 150 mg/dL — ABNORMAL HIGH (ref 70–99)
Glucose-Capillary: 176 mg/dL — ABNORMAL HIGH (ref 70–99)

## 2020-09-22 LAB — MAGNESIUM: Magnesium: 1.7 mg/dL (ref 1.7–2.4)

## 2020-09-22 MED ORDER — METOPROLOL SUCCINATE ER 50 MG PO TB24
50.0000 mg | ORAL_TABLET | Freq: Every day | ORAL | Status: DC
  Administered 2020-09-22 – 2020-09-24 (×3): 50 mg via ORAL
  Filled 2020-09-22 (×3): qty 1

## 2020-09-22 MED ORDER — AMIODARONE HCL 200 MG PO TABS: 400.0000 mg | ORAL_TABLET | Freq: Every day | ORAL | Status: DC

## 2020-09-22 MED ORDER — AMIODARONE LOAD VIA INFUSION
150.0000 mg | Freq: Once | INTRAVENOUS | Status: DC | PRN
  Filled 2020-09-22: qty 83.34

## 2020-09-22 MED ORDER — FUROSEMIDE 10 MG/ML IJ SOLN
60.0000 mg | Freq: Once | INTRAMUSCULAR | Status: AC
  Administered 2020-09-22: 60 mg via INTRAVENOUS
  Filled 2020-09-22: qty 6

## 2020-09-22 MED ORDER — MAGNESIUM SULFATE 2 GM/50ML IV SOLN
2.0000 g | Freq: Once | INTRAVENOUS | Status: AC
  Administered 2020-09-22: 2 g via INTRAVENOUS
  Filled 2020-09-22: qty 50

## 2020-09-22 MED ORDER — AMIODARONE LOAD VIA INFUSION
150.0000 mg | Freq: Once | INTRAVENOUS | Status: AC
  Administered 2020-09-22: 150 mg via INTRAVENOUS
  Filled 2020-09-22: qty 83.34

## 2020-09-22 MED ORDER — AMIODARONE HCL 200 MG PO TABS
400.0000 mg | ORAL_TABLET | Freq: Two times a day (BID) | ORAL | Status: DC
  Administered 2020-09-23 – 2020-09-29 (×12): 400 mg via ORAL
  Filled 2020-09-22 (×12): qty 2

## 2020-09-22 MED ORDER — AMIODARONE HCL IN DEXTROSE 360-4.14 MG/200ML-% IV SOLN
30.0000 mg/h | INTRAVENOUS | Status: DC
  Administered 2020-09-22 – 2020-09-23 (×2): 30 mg/h via INTRAVENOUS
  Filled 2020-09-22 (×3): qty 200

## 2020-09-22 MED ORDER — AMIODARONE HCL IN DEXTROSE 360-4.14 MG/200ML-% IV SOLN
60.0000 mg/h | INTRAVENOUS | Status: AC
  Administered 2020-09-22: 60 mg/h via INTRAVENOUS
  Filled 2020-09-22: qty 200

## 2020-09-22 MED ORDER — METOPROLOL SUCCINATE ER 25 MG PO TB24: 25.0000 mg | ORAL_TABLET | Freq: Every day | ORAL | Status: DC

## 2020-09-22 MED ORDER — ENOXAPARIN SODIUM 150 MG/ML ~~LOC~~ SOLN
1.0000 mg/kg | Freq: Two times a day (BID) | SUBCUTANEOUS | Status: DC
  Administered 2020-09-22 – 2020-09-24 (×4): 138 mg via SUBCUTANEOUS
  Filled 2020-09-22 (×4): qty 0.92

## 2020-09-22 NOTE — Progress Notes (Signed)
While assisting Pt to and from [facility] observed vtach HR up to 280 and Pt shocked x's 2 by ICD.   VS stablized with Pt back in bed.  Paged Cards.

## 2020-09-22 NOTE — Progress Notes (Signed)
  Echocardiogram 2D Echocardiogram has been performed.  Gregory [name] 09/22/2020, 5:38 PM

## 2020-09-22 NOTE — Plan of Care (Addendum)
Called to the bedside:  Three episodes of VT with two ICD shocks.  Three episodes once with ATP, two with shock. Loaded patient with Amiodarone IV Checking troponins X 2 for evidence of new ischemic event.  Transitioning Eliquis to lovenox  Discussed with EP who recommended for LCP on 09/24/20. Limited echo for eval of new WMA.  Discussed with patient and nursing team.  Werner Lean, MD   Patient amenable for LCP if necessary. Replaced Mg. No evidence of ACS event.

## 2020-09-22 NOTE — Progress Notes (Signed)
Progress Note  Patient Name: Gregory [name] Date of Encounter: 09/22/2020  [facility] Cardiologist: Sanda Klein, MD   Subjective   [name] is a 59 y.o. 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 Sabra Heck.  Had new atrial fibrillation.  Initially seen 09/09/20.  DCCV Twice through course.  Run of NSVT at 10 AM with feeling of palpitations. No chest pain, no shortness of breath.    Inpatient Medications    Scheduled Meds:  amiodarone  400 mg Oral BID   apixaban  5 mg Oral BID   atorvastatin  80 mg Oral QHS   Chlorhexidine Gluconate Cloth  6 each Topical Daily   diltiazem  180 mg Oral Daily   docusate sodium  100 mg Oral BID   insulin aspart  0-20 Units Subcutaneous TID WC   insulin aspart  0-5 Units Subcutaneous QHS   insulin detemir  5 Units Subcutaneous BID   levETIRAcetam  500 mg Oral BID   metoprolol tartrate  25 mg Oral BID   pantoprazole  40 mg Oral QHS   polyethylene glycol  17 g Oral Daily   sacubitril-valsartan  1 tablet Oral BID   Continuous Infusions:  PRN Meds: acetaminophen, metoprolol tartrate, traZODone   Vital Signs    Vitals:   09/21/20 1715 09/21/20 2050 09/21/20 2351 09/22/20 0416  BP: 122/82 102/71 121/82 125/84  Pulse:  92 74 90  Resp:  17 13 14   Temp: 98 F (36.7 C) 97.9 F (36.6 C) (!) 97.5 F (36.4 C) (!) 97.5 F (36.4 C)  TempSrc: Oral Oral Oral Oral  SpO2: 96% 97% 96% 95%  Weight:    (!) 137 kg  Height:        Intake/Output Summary (Last 24 hours) at 09/22/2020 0848 Last data filed at 09/22/2020 0300 Gross per 24 hour  Intake 120 ml  Output 1675 ml  Net -1555 ml   Last 3 Weights 09/22/2020 09/21/2020 09/20/2020  Weight (lbs) 302 lb 0.5 oz 308 lb 3.3 oz 319 lb 7.1 oz  Weight (kg) 137 kg 139.8 kg 144.9 kg       Telemetry    1004:  Appearance of short runs NSVT rate 188  Physical Exam   GEN: No acute distress.  Left eye scleral mass noted; stable since I've last assessed patient. Neck: No JVD Cardiac: RRR, no murmurs, rubs, or gallops.  Respiratory: Clear to auscultation bilaterally. GI: Soft, nontender, non-distended  MS:  2+ lower extremity edema; No deformity. Neuro:  Nonfocal  Psych: Normal affect   Labs    High Sensitivity Troponin:   Recent Labs  Lab 09/09/20 1336  TROPONINIHS 48*      Chemistry Recent Labs  Lab 09/16/20 0601 09/16/20 0601 09/17/20 0544 09/17/20 0544 09/18/20 0500 09/18/20 0500 09/19/20 0415 09/21/20 0445 09/22/20 0637  NA 138   < > 140   < > 135   < > 135 136 137  K 4.6   < > 4.6   < > 4.1   < > 4.6 4.4 4.3  CL 102   < > 103   < > 100   < > 99 99 98  CO2 28   < > 29   < > 28   < > 28  28 29  GLUCOSE 190*   < > 141*   < > 197*   < > 185* 195* 148*  BUN 20   < > 22   < > 21   < > 14 11 12   CREATININE 1.18   < > 1.10   < > 1.00   < > 0.94 0.88 0.89  CALCIUM 8.3*   < > 8.2*   < > 7.8*   < > 7.8* 7.8* 7.8*  GFRNONAA >60   < > >60   < > >60   < > >60 >60 >60  GFRAA >60  --  >60  --  >60  --   --   --   --   ANIONGAP 8   < > 8   < > 7   < > 8 9 10    < > = values in this interval not displayed.     Hematology Recent Labs  Lab 09/17/20 0630 09/17/20 0630 09/18/20 0500 09/19/20 0415 09/19/20 1022  WBC 17.8*  --  14.4* 12.4*  --   RBC 3.53*   < > 3.29* 3.16* 3.29*  HGB 11.8*  --  11.1* 10.8*  --   HCT 37.1*  --  34.2* 32.9*  --   MCV 105.1*  --  104.0* 104.1*  --   MCH 33.4  --  33.7 34.2*  --   MCHC 31.8  --  32.5 32.8  --   RDW 18.5*  --  18.4* 18.4*  --   PLT 187  --  203 199  --    < > = values in this interval not displayed.    Radiology    No results found.  Cardiac Studies   Echo EF 60%  Patient Profile     59 y.o. male with CAD, HF recovered ejection fraction, prior ventricular tachycardia with ICD, paroxysmal atrial  fibrillation status post cardioversion on 10/6 with the assistance of amiodarone.  Assessment & Plan    HF recovered EF - hypervolemic -Excellent diuresis on 60 mg IV Lasix, 1.5 L net out down to 302 lbs.  Dry weight ~ 280 lbs. - will givel 60 mg Lasix IV - continue Entresto - will transition to 50 mg metoprolol succinate once daily  VT status post ICD -Continue to maintain good electrolyte balance. - on amiodarone, with some NSVT, will increase BB at above  Persistent atrial fibrillation - CHASDVASC 3 and s/p DCCV -History includes early return of atrial fibrillation after cardioversion on 9/30, given amiodarone load and repeat cardioversion on 09/19/2020 in sinus rhythm now.  Continuing with Eliquis for anticoagulation.  Amiodarone load.  TSH normal.  Case management consulted for concerns over cost of medication.  He is also on diltiazem 180 and BB as above -On discharge give amiodarone 200 mg twice daily for the next 2 weeks and then 200 mg a day thereafter. - will need outpatient follow up for TFTs, LFTs, and PFTs   CAD -Continue with secondary risk factor modification, statin. Eliquis in place of statin, no recent PCI, on BB     For questions or updates, please contact [facility] Please consult [email or web address] for contact info under        Signed, Werner Lean, MD  09/22/2020, 8:48 AM

## 2020-09-22 NOTE — Progress Notes (Signed)
ANTICOAGULATION CONSULT NOTE - Initial Consult  Pharmacy Consult for enoxaparin Indication: atrial fibrillation  No Known Allergies  Patient Measurements: Height: 6\' 6"  (198.1 cm) Weight: (!) 137 kg (302 lb 0.5 oz) IBW/kg (Calculated) : 91.4  Vital Signs: Temp: 97.5 F (36.4 C) (10/09 0416) Temp Source: Oral (10/09 0416) BP: 125/84 (10/09 0416) Pulse Rate: 100 (10/09 1100)  Labs: Recent Labs    09/21/20 0445 09/22/20 0637 09/22/20 1137  CREATININE 0.88 0.89 0.99    Estimated Creatinine Clearance: 121.5 mL/min (by C-G formula based on SCr of 0.99 mg/dL).   Medical History: Past Medical History:  Diagnosis Date  . CAD (coronary artery disease) 12/01/2013  . Chronic combined systolic and diastolic CHF, NYHA class 1 ([place]) 12/01/2013  . Erectile dysfunction 12/01/2013  . HTN (hypertension) 12/01/2013  . Hyperlipidemia 12/01/2013  . Ischemic cardiomyopathy 12/01/2013  . Sleep apnea     Medications:  Scheduled:  . [START ON 09/23/2020] amiodarone  400 mg Oral Q12H   Followed by  . [START ON 10/07/2020] amiodarone  400 mg Oral Daily  . atorvastatin  80 mg Oral QHS  . Chlorhexidine Gluconate Cloth  6 each Topical Daily  . diltiazem  180 mg Oral Daily  . docusate sodium  100 mg Oral BID  . insulin aspart  0-20 Units Subcutaneous TID WC  . insulin aspart  0-5 Units Subcutaneous QHS  . insulin detemir  5 Units Subcutaneous BID  . levETIRAcetam  500 mg Oral BID  . metoprolol succinate  50 mg Oral Daily  . pantoprazole  40 mg Oral QHS  . polyethylene glycol  17 g Oral Daily  . sacubitril-valsartan  1 tablet Oral BID    Assessment: 59yoM starting enoxaparin for atrial fibrillation. Previously on apixaban but transitioned to enoxaparin for upcoming LCP on 10/11. Last dose of apixaban taken ~1030 today. Last Hgb 10-11, PLT WNL.  Goal of Therapy:  Monitor platelets by anticoagulation protocol: Yes   Plan:  D/c apixaban Initiate Lovenox 1mg /kg every 12 hours Follow  daily CBC and cardiology plans   Mercy Riding, PharmD PGY1 Acute Care Pharmacy Resident Please refer to [facility] for unit-specific pharmacist

## 2020-09-22 NOTE — Progress Notes (Signed)
[facility] PROGRESS NOTE    Progress Note  Gregory [name]  TIW:[id number] DOB: April 07, 1961 DOA: 09/09/2020 PCP: Patient, No Pcp Per     Brief Narrative:   Gregory [name] is an 59 y.o. male past medical history of coronary artery disease, chronic diastolic heart failure, ischemic cardiomyopathy essential hypertension status post cardiac defibrillation who initially presented to the ED with nausea vomiting and diarrhea was found to be in acute kidney injury requiring vasopressors initially admitted to the ICU infectious work-up turned out to be negative.  He then developed A. 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 EP was consulted and recommended DCCV on 09/19/2020. Patient did develop acute confusion, leukocytosis and was suspected to be in sepsis due to UTI started empirically on antibiotics.  Assessment/Plan:   Atrial fibrillation with rapid ventricular response [facility]): CHADS Vasc score greater than 4 Heart rate is about 80-101, he is currently on amiodarone load 200 mg p.o. twice daily for 10 days continue metoprolol and Cardizem he has successfully been cardioverted on a second attempt. He is currently on Eliquis which continue as an outpatient. Patient is medically stable for transfer Worker's Comp. has been submitted by CIR for prior authorization as well Eliquis.  Acute confusional state/acute encephalopathy: Likely due to infectious etiology.  CT unremarkable unable to do MRI due to AICD. His mental status has  Improved,has been neurologically intact.  Severe sepsis due to UTI: He was started on IV Rocephin his encephalopathy resolved. He will complete his course of oral antibiotics in house. Culture data has remained negative till date.  Acute on chronic diastolic heart  failure: Back in September his weight was 1 29 to 135 kg now he is weighing 139 which is down from 144. He was started on IV Lasix, monitor electrolytes and replete as needed. Follow strict I's and O's and daily weights, according to chart he is 10 L positive.  Acute kidney injury: Likely prerenal in the setting of A. fib with RVR with a baseline less than 1. Resolved with IV fluid hydration.    Thrombocytopenia: After starting heparin head bodies were negative there is no evidence of bleeding after stopping heparin his platelets are improving.  Hypocalcemia: Was giving calcium gluconate now resolved.  Coronary artery disease: Status post AICD, denies any chest pain or shortness of breath.  Macrocytic anemia: Folate of 300, B12 was 683. Check an anemia panel.  Chronic combined systolic and diastolic heart failure (HCC) Appears euvolemic on physical exam KVO IV fluids last echo showed an EF of 55%.  Obstructive sleep apnea: Continue CPAP at night.  Mild transaminitis: Abdominal ultrasound was negative for acute cholecystitis he denies any abdominal pain. Likely cardiovascular mediated now resolved.  Newly diagnosed diabetes mellitus type 2: Check an A1c he was started on long-acting insulin plus sliding scale we will increase long-acting insulin his blood glucose not controlled.  Seizures: Continue Keppra.   DVT prophylaxis: Eliquis Family Communication:none Status is: Inpatient  Remains inpatient appropriate because:Hemodynamically unstable   Dispo: The patient is from: Home              Anticipated d/c is to: CIR              Anticipated d/c date is: 2 days              Patient currently patient is medically  stable transferred to CIR.  Awaiting CIR recommendations.        Code Status:     Code Status Orders  (From admission, onward)         Start     Ordered   09/09/20 1642  Full code  Continuous        09/09/20 1643        Code Status History     Date Active Date Inactive Code Status Order ID Comments User Context   06/07/2018 2227 06/09/2018 1730 Full Code [id number]  Jani Gravel, MD Inpatient   12/19/2016 1651 12/19/2016 2113 Full Code [id number]  [name], MD Inpatient   05/04/2016 2100 05/07/2016 2158 Full Code [id number]  Jerline Pain, MD Inpatient   Advance Care Planning Activity        IV Access:    Peripheral IV   Procedures and diagnostic studies:   No results found.   Medical Consultants:    None.  Anti-Infectives:   keflex  Subjective:    Gregory [name] relates he feels great no new complaints unsteady on his feet.  Objective:    Vitals:   09/21/20 1715 09/21/20 2050 09/21/20 2351 09/22/20 0416  BP: 122/82 102/71 121/82 125/84  Pulse:  92 74 90  Resp:  17 13 14   Temp: 98 F (36.7 C) 97.9 F (36.6 C) (!) 97.5 F (36.4 C) (!) 97.5 F (36.4 C)  TempSrc: Oral Oral Oral Oral  SpO2: 96% 97% 96% 95%  Weight:    (!) 137 kg  Height:       SpO2: 95 % O2 Flow Rate (L/min): 2 L/min   Intake/Output Summary (Last 24 hours) at 09/22/2020 0859 Last data filed at 09/22/2020 0300 Gross per 24 hour  Intake 120 ml  Output 1675 ml  Net -1555 ml   Filed Weights   09/20/20 0436 09/21/20 0452 09/22/20 0416  Weight: (!) 144.9 kg (!) 139.8 kg (!) 137 kg    Exam: General exam: In no acute distress. Respiratory system: Good air movement and clear to auscultation. Cardiovascular system: S1 & S2 heard, RRR. No JVD. Gastrointestinal system: Abdomen is nondistended, soft and nontender.  Extremities: No pedal edema. Skin: No rashes, lesions or ulcers  Data Reviewed:    Labs: Basic Metabolic Panel: Recent Labs  Lab 09/16/20 0601 09/16/20 0601 09/17/20 0544 09/17/20 0544 09/18/20 0500 09/18/20 0500 09/19/20 0415 09/19/20 0415 09/21/20 0445 09/22/20 0637  NA 138   < > 140  --  135  --  135  --  136 137  K 4.6   < > 4.6   < > 4.1   < > 4.6   < > 4.4 4.3  CL 102   < > 103  --  100   --  99  --  99 98  CO2 28   < > 29  --  28  --  28  --  28 29  GLUCOSE 190*   < > 141*  --  197*  --  185*  --  195* 148*  BUN 20   < > 22  --  21  --  14  --  11 12  CREATININE 1.18   < > 1.10  --  1.00  --  0.94  --  0.88 0.89  CALCIUM 8.3*   < > 8.2*  --  7.8*  --  7.8*  --  7.8* 7.8*  MG 2.3  --  2.1  --  2.0  --   --   --   --   --    < > = values in this interval not displayed.   GFR Estimated Creatinine Clearance: 135.1 mL/min (by C-G formula based on SCr of 0.89 mg/dL). Liver Function Tests: No results for input(s): AST, ALT, ALKPHOS, BILITOT, PROT, ALBUMIN in the last 168 hours. No results for input(s): LIPASE, AMYLASE in the last 168 hours. Recent Labs  Lab 09/16/20 0951  AMMONIA 17   Coagulation profile Recent Labs  Lab 09/19/20 0546  INR 1.5*   COVID-19 Labs  Recent Labs    09/19/20 1021  FERRITIN 1,062*    Lab Results  Component Value Date   SARSCOV2NAA NEGATIVE 09/09/2020   [place] Not Detected 12/05/2019    CBC: Recent Labs  Lab 09/16/20 0820 09/17/20 0630 09/18/20 0500 09/19/20 0415  WBC 24.5* 17.8* 14.4* 12.4*  NEUTROABS  --   --  13.5* 10.6*  HGB 12.4* 11.8* 11.1* 10.8*  HCT 39.6 37.1* 34.2* 32.9*  MCV 105.6* 105.1* 104.0* 104.1*  PLT 167 187 203 199   Cardiac Enzymes: No results for input(s): CKTOTAL, CKMB, CKMBINDEX, TROPONINI in the last 168 hours. BNP (last 3 results) No results for input(s): PROBNP in the last 8760 hours. CBG: Recent Labs  Lab 09/21/20 1055 09/21/20 1232 09/21/20 1708 09/21/20 2045 09/22/20 0606  GLUCAP 299* 317* 253* 155* 178*   D-Dimer: No results for input(s): DDIMER in the last 72 hours. Hgb A1c: Recent Labs    09/21/20 1130  HGBA1C 6.9*   Lipid Profile: No results for input(s): CHOL, HDL, LDLCALC, TRIG, CHOLHDL, LDLDIRECT in the last 72 hours. Thyroid function studies: No results for input(s): TSH, T4TOTAL, T3FREE, THYROIDAB in the last 72 hours.  Invalid input(s): FREET3 Anemia work  up: Recent Labs    09/19/20 1021 09/19/20 1022  FERRITIN 1,062*  --   TIBC 153*  --   IRON 27*  --   RETICCTPCT  --  2.1   Sepsis Labs: Recent Labs  Lab 09/16/20 0820 09/17/20 0630 09/18/20 0500 09/19/20 0415  WBC 24.5* 17.8* 14.4* 12.4*   Microbiology Recent Results (from the past 240 hour(s))  Culture, blood (Routine X 2) w Reflex to ID Panel     Status: None   Collection Time: 09/16/20 10:10 AM   Specimen: BLOOD RIGHT HAND  Result Value Ref Range Status   Specimen Description BLOOD RIGHT HAND  Final   Special Requests   Final    BOTTLES DRAWN AEROBIC ONLY Blood Culture results may not be optimal due to an inadequate volume of blood received in culture bottles   Culture   Final    NO GROWTH 5 DAYS Performed at [facility], [place] [address] [place], [place] [address]    Report Status 09/21/2020 FINAL  Final  Culture, blood (Routine X 2) w Reflex to ID Panel     Status: None   Collection Time: 09/16/20 10:10 AM   Specimen: BLOOD RIGHT HAND  Result Value Ref Range Status   Specimen Description BLOOD RIGHT HAND  Final   Special Requests   Final    BOTTLES DRAWN AEROBIC ONLY Blood Culture results may not be optimal due to an inadequate volume of blood received in culture bottles   Culture   Final    NO GROWTH 5 DAYS Performed at [facility], [place] [address], [place], [place] [address]    Report Status 09/21/2020 FINAL  Final  Medications:   . amiodarone  400 mg Oral BID  . apixaban  5 mg Oral BID  . atorvastatin  80 mg Oral QHS  . Chlorhexidine Gluconate Cloth  6 each Topical Daily  . diltiazem  180 mg Oral Daily  . docusate sodium  100 mg Oral BID  . furosemide  60 mg Intravenous Once  . insulin aspart  0-20 Units Subcutaneous TID WC  . insulin aspart  0-5 Units Subcutaneous QHS  . insulin detemir  5 Units Subcutaneous BID  . levETIRAcetam  500 mg Oral BID  . metoprolol succinate  25 mg Oral Daily  . pantoprazole  40 mg Oral QHS  .  polyethylene glycol  17 g Oral Daily  . sacubitril-valsartan  1 tablet Oral BID   Continuous Infusions:    LOS: 13 days   Charlynne Cousins  [facility]  09/22/2020, 8:59 AM

## 2020-09-22 NOTE — Progress Notes (Signed)
Electrophysiology Rounding Note  Patient Name: Gregory [name] Date of Encounter: 09/22/2020  Primary Cardiologist: Sanda Aariah Godette, MD Electrophysiologist: Dr. Caryl Comes   Subjective   Seen following recurrent ventricular tachycardia for which she was shocked this morning x2 and ATP successful x1.  No antecedent symptoms.  He has been standing up for just about a minute or 2 (see below).  He reminds me that when he got stented, his presenting symptoms were shortness of breath but without chest pain.  Inpatient Medications    Scheduled Meds: . [START ON 09/23/2020] amiodarone  400 mg Oral Q12H   Followed by  . [START ON 10/07/2020] amiodarone  400 mg Oral Daily  . atorvastatin  80 mg Oral QHS  . Chlorhexidine Gluconate Cloth  6 each Topical Daily  . diltiazem  180 mg Oral Daily  . docusate sodium  100 mg Oral BID  . enoxaparin (LOVENOX) injection  1 mg/kg Subcutaneous Q12H  . insulin aspart  0-20 Units Subcutaneous TID WC  . insulin aspart  0-5 Units Subcutaneous QHS  . insulin detemir  5 Units Subcutaneous BID  . levETIRAcetam  500 mg Oral BID  . metoprolol succinate  50 mg Oral Daily  . pantoprazole  40 mg Oral QHS  . polyethylene glycol  17 g Oral Daily  . sacubitril-valsartan  1 tablet Oral BID   Continuous Infusions: . amiodarone 60 mg/hr (09/22/20 1222)   Followed by  . amiodarone    . magnesium sulfate bolus IVPB     PRN Meds: acetaminophen, amiodarone, metoprolol tartrate, traZODone   Vital Signs    Vitals:   09/22/20 1230 09/22/20 1300 09/22/20 1330 09/22/20 1415  BP: 110/78 111/80 98/80 102/70  Pulse:  (!) 25 90 87  Resp: 14 13 15 16   Temp:      TempSrc:      SpO2:  95% 97% 96%  Weight:      Height:        Intake/Output Summary (Last 24 hours) at 09/22/2020 1504 Last data filed at 09/22/2020 1230 Gross per 24 hour  Intake 120 ml  Output 2475 ml  Net -2355 ml   Filed Weights   09/20/20 0436 09/21/20 0452 09/22/20 0416  Weight: (!)  144.9 kg (!) 139.8 kg (!) 137 kg    Physical Exam    Well developed and nourished in no acute distress HENT normal Neck supple  Clear Regular rate and rhythm, no murmurs or gallops Abd-soft with active BS No Clubbing cyanosis edema Skin-warm and dry A & Oriented  Grossly normal sensory and motor function     Labs    CBC No results for input(s): WBC, NEUTROABS, HGB, HCT, MCV, PLT in the last 72 hours. Basic Metabolic Panel Recent Labs    09/22/20 0637 09/22/20 1137  NA 137 136  K 4.3 4.3  CL 98 97*  CO2 29 28  GLUCOSE 148* 199*  BUN 12 12  CREATININE 0.89 0.99  CALCIUM 7.8* 7.8*  MG  --  1.7   Liver Function Tests No results for input(s): AST, ALT, ALKPHOS, BILITOT, PROT, ALBUMIN in the last 72 hours. No results for input(s): LIPASE, AMYLASE in the last 72 hours. Cardiac Enzymes No results for input(s): CKTOTAL, CKMB, CKMBINDEX, TROPONINI in the last 72 hours.   Telemetry    Sinus rhythm.  It was noted that in the 30-45 minutes prior to VT, there is an increase sinus rate and an increased PVC burden. The first beat of his tachycardia  was similar to the ensuing beats suggesting a nonreentrant, triggered mechanism  Radiology    No results found.  Patient Profile     59 y.o.malew/PMHx of CAD (knonw chronic occl of RCA with prior PCIs to LAD, diag, and LCx), VT, ICM, OSA w/CPAP, seizures, chronic CHF, HLD, HTN, obesity  admitted to [facility] 9/26 with progressive weakness, orthostatic dizziness, and a syncopal event, He was markedly hypotensive, started on IVF with agressive replacement as well a levophed, felt to be in shock (unceratin etiology septic vs hypovolemic) also noted to be in new AFib  Assessment & Plan     New Afib in setting of shock   Shock   CAD with prior PCI  Ventricular tachycardia-recurrent-monomorphic   CHF chronic-diastolic     The patient had recurrent monomorphic ventricular tachycardia this morning requiring shock x2 and ATP  x1.  This was preceded by an increase in heart rate and PVC burden for about 30 minutes prior to the first episode.  The first beat of the tachycardia is similar to the subsequent beat suggesting a trigger mechanism.  All these things together suggest an underlying myocardial perhaps ischemic process.  Agree with Dr. Jennette Bill about loading with amiodarone.  Given the presumed mechanism of the VT, I would have a low threshold for adding lidocaine which has been shown to hyper polarize ischemic myocardium which might be responsible for the trigger ventricular tachycardia.  With visit prior history of coronary disease, not withstanding the issue of Occam's razor, I think empiric anticoagulation and catheterization is indicated; begin heparin  Will increase metop and decrease dilt      Virl Axe, MD  09/22/2020, 3:04 PM

## 2020-09-22 NOTE — Progress Notes (Signed)
  Amiodarone Drug - Drug Interaction Consult Note  Recommendations: No major DDIs of note. Continue with current regimen and continue to monitor.  Amiodarone is metabolized by the cytochrome P450 system and therefore has the potential to cause many drug interactions. Amiodarone has an average plasma half-life of 50 days (range 20 to 100 days).   There is potential for drug interactions to occur several weeks or months after stopping treatment and the onset of drug interactions may be slow after initiating amiodarone.    [x]  Statins: Increased risk of myopathy. Simvastatin- restrict dose to 20mg  daily. Other statins: counsel patients to report any muscle pain or weakness immediately.  []  Anticoagulants: Amiodarone can increase anticoagulant effect. Consider warfarin dose reduction. Patients should be monitored closely and the dose of anticoagulant altered accordingly, remembering that amiodarone levels take several weeks to stabilize.  []  Antiepileptics: Amiodarone can increase plasma concentration of phenytoin, the dose should be reduced. Note that small changes in phenytoin dose can result in large changes in levels. Monitor patient and counsel on signs of toxicity.  []  Beta blockers: increased risk of bradycardia, AV block and myocardial depression. Sotalol - avoid concomitant use.  [x]   Calcium channel blockers (diltiazem and verapamil): increased risk of bradycardia, AV block and myocardial depression.  []   Cyclosporine: Amiodarone increases levels of cyclosporine. Reduced dose of cyclosporine is recommended.  []  Digoxin dose should be halved when amiodarone is started.  [x]  Diuretics: increased risk of cardiotoxicity if hypokalemia occurs.  []  Oral hypoglycemic agents (glyburide, glipizide, glimepiride): increased risk of hypoglycemia. Patient's glucose levels should be monitored closely when initiating amiodarone therapy.   []  Drugs that prolong the QT interval:  Torsades de pointes  risk may be increased with concurrent use - avoid if possible.  Monitor QTc, also keep magnesium/potassium WNL if concurrent therapy can't be avoided. Marland Kitchen Antibiotics: e.g. fluoroquinolones, erythromycin. . Antiarrhythmics: e.g. quinidine, procainamide, disopyramide, sotalol. . Antipsychotics: e.g. phenothiazines, haloperidol.  . Lithium, tricyclic antidepressants, and methadone.   Thank You, Mercy Riding, PharmD PGY1 Acute Care Pharmacy Resident Please refer to [facility] for unit-specific pharmacist

## 2020-09-23 ENCOUNTER — Encounter (HOSPITAL_COMMUNITY): Payer: Self-pay | Admitting: Internal Medicine

## 2020-09-23 LAB — CBC
HCT: 31.1 % — ABNORMAL LOW (ref 39.0–52.0)
Hemoglobin: 10.3 g/dL — ABNORMAL LOW (ref 13.0–17.0)
MCH: 33.3 pg (ref 26.0–34.0)
MCHC: 33.1 g/dL (ref 30.0–36.0)
MCV: 100.6 fL — ABNORMAL HIGH (ref 80.0–100.0)
Platelets: 279 10*3/uL (ref 150–400)
RBC: 3.09 MIL/uL — ABNORMAL LOW (ref 4.22–5.81)
RDW: 17.5 % — ABNORMAL HIGH (ref 11.5–15.5)
WBC: 10.1 10*3/uL (ref 4.0–10.5)
nRBC: 0 % (ref 0.0–0.2)

## 2020-09-23 LAB — GLUCOSE, CAPILLARY
Glucose-Capillary: 166 mg/dL — ABNORMAL HIGH (ref 70–99)
Glucose-Capillary: 191 mg/dL — ABNORMAL HIGH (ref 70–99)
Glucose-Capillary: 168 mg/dL — ABNORMAL HIGH (ref 70–99)
Glucose-Capillary: 153 mg/dL — ABNORMAL HIGH (ref 70–99)

## 2020-09-23 LAB — BASIC METABOLIC PANEL
Anion gap: 6 (ref 5–15)
BUN: 12 mg/dL (ref 8–23)
CO2: 32 mmol/L (ref 22–32)
Calcium: 7.7 mg/dL — ABNORMAL LOW (ref 8.9–10.3)
Chloride: 96 mmol/L — ABNORMAL LOW (ref 98–111)
Creatinine, Ser: 0.85 mg/dL (ref 0.61–1.24)
GFR, Estimated: >60 mL/min (ref >60)
Glucose, Bld: 191 mg/dL — ABNORMAL HIGH (ref 70–99)
Potassium: 3.8 mmol/L (ref 3.5–5.1)
Sodium: 134 mmol/L — ABNORMAL LOW (ref 135–145)

## 2020-09-23 LAB — MAGNESIUM: Magnesium: 1.8 mg/dL (ref 1.7–2.4)

## 2020-09-23 MED ORDER — FUROSEMIDE 10 MG/ML IJ SOLN
60.0000 mg | Freq: Once | INTRAMUSCULAR | Status: AC
  Administered 2020-09-23: 60 mg via INTRAVENOUS
  Filled 2020-09-23: qty 6

## 2020-09-23 MED ORDER — SODIUM CHLORIDE 0.9 % IV SOLN: 250.0000 mL | INTRAVENOUS | Status: DC | PRN

## 2020-09-23 MED ORDER — SODIUM CHLORIDE 0.9 % WEIGHT BASED INFUSION
3.0000 mL/kg/h | INTRAVENOUS | Status: DC
  Administered 2020-09-24: 3 mL/kg/h via INTRAVENOUS

## 2020-09-23 MED ORDER — SODIUM CHLORIDE 0.9% FLUSH: 3.0000 mL | Freq: Two times a day (BID) | INTRAVENOUS | Status: DC

## 2020-09-23 MED ORDER — SODIUM CHLORIDE 0.9% FLUSH: 3.0000 mL | INTRAVENOUS | Status: DC | PRN

## 2020-09-23 MED ORDER — SODIUM CHLORIDE 0.9 % WEIGHT BASED INFUSION: 1.0000 mL/kg/h | INTRAVENOUS | Status: DC

## 2020-09-23 MED ORDER — ASPIRIN 81 MG PO CHEW
81.0000 mg | CHEWABLE_TABLET | ORAL | Status: AC
  Administered 2020-09-24: 81 mg via ORAL
  Filled 2020-09-23: qty 1

## 2020-09-23 MED ORDER — TRAZODONE HCL 50 MG PO TABS: 50.0000 mg | ORAL_TABLET | Freq: Every evening | ORAL | Status: DC | PRN

## 2020-09-23 MED ORDER — SODIUM CHLORIDE 0.9% FLUSH
3.0000 mL | Freq: Two times a day (BID) | INTRAVENOUS | Status: DC
  Administered 2020-09-23: 3 mL via INTRAVENOUS

## 2020-09-23 MED ORDER — SODIUM CHLORIDE 0.9 % IV SOLN: INTRAVENOUS | Status: DC

## 2020-09-23 MED ORDER — ASPIRIN 81 MG PO CHEW: 81.0000 mg | CHEWABLE_TABLET | ORAL | Status: DC

## 2020-09-23 NOTE — Progress Notes (Signed)
Progress Note  Patient Name: Gregory [name] Date of Encounter: 09/23/2020  [facility] Cardiologist: Sanda Klein, MD   Subjective   Gregory [name] [name] is a 59 y.o. 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 Sabra Heck.  Had new atrial fibrillation.  Initially seen 09/09/20.  DCCV Twice through course.  Limited Echo Performed over night:  Slight decrease in LV function globaby without regional WMA.  Getting Amiodarone IV load.  Patient notes significant anxiety about getting shocked again.  Otherwise, doing well.  No CP, thought no CP before prior coronary intervention.  Inpatient Medications    Scheduled Meds:  amiodarone  400 mg Oral Q12H   Followed by   Derrill Memo ON 10/07/2020] amiodarone  400 mg Oral Daily   atorvastatin  80 mg Oral QHS   Chlorhexidine Gluconate Cloth  6 each Topical Daily   diltiazem  180 mg Oral Daily   docusate sodium  100 mg Oral BID   enoxaparin (LOVENOX) injection  1 mg/kg Subcutaneous Q12H   insulin aspart  0-20 Units Subcutaneous TID WC   insulin aspart  0-5 Units Subcutaneous QHS   insulin detemir  5 Units Subcutaneous BID   levETIRAcetam  500 mg Oral BID   metoprolol succinate  50 mg Oral Daily   pantoprazole  40 mg Oral QHS   polyethylene glycol  17 g Oral Daily   sacubitril-valsartan  1 tablet Oral BID   Continuous Infusions:  amiodarone 30 mg/hr (09/23/20 0500)   PRN Meds: acetaminophen, amiodarone, metoprolol tartrate, traZODone   Vital Signs    Vitals:   09/23/20 0000 09/23/20 0046 09/23/20 0300 09/23/20 0740  BP: 111/78  111/80 116/85  Pulse: 85 90 85   Resp: 12  12 16   Temp: 98.1 F (36.7 C)  97.8 F (36.6 C)   TempSrc: Oral Oral Oral   SpO2: 92%  98% 96%  Weight:   (!) 141.2 kg   Height:        Intake/Output  Summary (Last 24 hours) at 09/23/2020 0846 Last data filed at 09/23/2020 0500 Gross per 24 hour  Intake 329.17 ml  Output 1550 ml  Net -1220.83 ml   Last 3 Weights 09/23/2020 09/22/2020 09/21/2020  Weight (lbs) 311 lb 4.6 oz 302 lb 0.5 oz 308 lb 3.3 oz  Weight (kg) 141.2 kg 137 kg 139.8 kg      Telemetry    Since POC Note 10/9, no further VT  Physical Exam   GEN: No acute distress.  Left eye scleral mass  Neck: No JVD Cardiac: RRR, no murmurs, rubs, or gallops.  Respiratory: Clear to auscultation bilaterally. GI: Soft, nontender, non-distended  MS:  2+ lower extremity edema; No deformity. Neuro:  Nonfocal  Psych: Anxious affect   Labs    High Sensitivity Troponin:   Recent Labs  Lab 09/09/20 1336 09/22/20 1137 09/22/20 1444  TROPONINIHS 48* 18* 25*     Chemistry Recent Labs  Lab 09/17/20 0544 09/17/20 0544 09/18/20 0500 09/19/20 0415 09/22/20 0637 09/22/20 1137 09/23/20 0107  NA 140   < > 135   < > 137 136 134*  K 4.6   < > 4.1   < > 4.3 4.3 3.8  CL 103   < > 100   < > 98 97*  96*  CO2 29   < > 28   < > 29 28 32  GLUCOSE 141*   < > 197*   < > 148* 199* 191*  BUN 22   < > 21   < > 12 12 12   CREATININE 1.10   < > 1.00   < > 0.89 0.99 0.85  CALCIUM 8.2*   < > 7.8*   < > 7.8* 7.8* 7.7*  GFRNONAA >60   < > >60   < > >60 >60 >60  GFRAA >60  --  >60  --   --   --   --   ANIONGAP 8   < > 7   < > 10 11 6    < > = values in this interval not displayed.     Hematology Recent Labs  Lab 09/18/20 0500 09/18/20 0500 09/19/20 0415 09/19/20 1022 09/23/20 0107  WBC 14.4*  --  12.4*  --  10.1  RBC 3.29*   < > 3.16* 3.29* 3.09*  HGB 11.1*  --  10.8*  --  10.3*  HCT 34.2*  --  32.9*  --  31.1*  MCV 104.0*  --  104.1*  --  100.6*  MCH 33.7  --  34.2*  --  33.3  MCHC 32.5  --  32.8  --  33.1  RDW 18.4*  --  18.4*  --  17.5*  PLT 203  --  199  --  279   < > = values in this interval not displayed.    Radiology    ECHOCARDIOGRAM LIMITED  Result Date:  09/23/2020    ECHOCARDIOGRAM LIMITED REPORT   Patient Name:   Gregory [name] Date of Exam: 09/22/2020 Medical Rec #:  [id number]                 Height:       78.0 in Accession #:    [id number]                Weight:       302.0 lb Date of Birth:  [birth year]                 BSA:          2.690 m Patient Age:    75 years                  BP:           102/70 mmHg Patient Gender: M                         HR:           82 bpm. Exam Location:  Inpatient Procedure: Limited Color Doppler, Cardiac Doppler and Limited Echo Indications:    ventricular tachycardia  History:        Patient has prior history of Echocardiogram examinations, most                 recent 09/10/2020. CHF, CAD, Defibrillator, Arrythmias:Atrial                 Fibrillation; Risk Factors:Hypertension.  Sonographer:    Johny Chess Referring Phys: [id number] [place] A Clarisse Rodriges IMPRESSIONS  1. Left ventricular ejection fraction, by estimation, is 45 to 50%. The left ventricle has mildly decreased function. The left ventricle demonstrates global hypokinesis. Left ventricular diastolic parameters are consistent with Grade I diastolic dysfunction (impaired relaxation).  2. Right ventricular  systolic function was not well visualized. The right ventricular size is normal.  3. The mitral valve is grossly normal. No evidence of mitral valve regurgitation.  4. The aortic valve was not well visualized.  5. The inferior vena cava is normal in size with greater than 50% respiratory variability, suggesting right atrial pressure of 3 mmHg. Comparison(s): Slight decrease in LV function with different caliber (non-contrasted) study. Slight decrease in global LVEF. FINDINGS  Left Ventricle: Left ventricular ejection fraction, by estimation, is 45 to 50%. The left ventricle has mildly decreased function. The left ventricle demonstrates global hypokinesis. Left ventricular diastolic parameters are consistent with Grade I diastolic dysfunction (impaired  relaxation). Right Ventricle: The right ventricular size is normal. Right ventricular systolic function was not well visualized. Pericardium: There is no evidence of pericardial effusion. Mitral Valve: The mitral valve is grossly normal. Tricuspid Valve: The tricuspid valve is not well visualized. Tricuspid valve regurgitation is trivial. Aortic Valve: The aortic valve was not well visualized. Pulmonic Valve: The pulmonic valve was not well visualized. Pulmonic valve regurgitation is not visualized. Aorta: The aortic root and ascending aorta are structurally normal, with no evidence of dilitation. Venous: The inferior vena cava is normal in size with greater than 50% respiratory variability, suggesting right atrial pressure of 3 mmHg. LEFT VENTRICLE PLAX 2D LVIDd:         5.60 cm  Diastology LVIDs:         4.70 cm  LV e' medial:    7.40 cm/s LV PW:         1.10 cm  LV E/e' medial:  7.0 LV IVS:        1.30 cm  LV e' lateral:   11.10 cm/s LVOT diam:     2.30 cm  LV E/e' lateral: 4.7 LV SV:         62 LV SV Index:   23 LVOT Area:     4.15 cm  AORTIC VALVE LVOT Vmax:   81.60 cm/s LVOT Vmean:  62.300 cm/s LVOT VTI:    0.149 m  AORTA Ao Root diam: 4.40 cm Ao Asc diam:  3.60 cm MITRAL VALVE MV Area (PHT): 3.03 cm    SHUNTS MV Decel Time: 250 msec    Systemic VTI:  0.15 m MV E velocity: 51.80 cm/s  Systemic Diam: 2.30 cm MV A velocity: 60.00 cm/s MV E/A ratio:  0.86 Rudean Haskell MD Electronically signed by Rudean Haskell MD Signature Date/Time: 09/23/2020/7:33:51 AM    Final    Cardiac Studies   Echo EF 60%-> 45-45% post Shock without WMAs   Patient Profile     59 y.o. male with CAD, HF recovered ejection fraction, prior ventricular tachycardia with ICD, paroxysmal atrial fibrillation status post cardioversion on 10/6 with the assistance of amiodarone.  Assessment & Plan   VT status post ICD and CAD -Continue to maintain good electrolyte balance. - Finishing amiodarone load IV then 400 mg oral  load continuing - EP following:  Given PMVT event without evidence of trigger, planned for LCP 09/24/20   HF recovered EF - hypervolemic -reasonable improvement with IV lasix - will give 60 mg Lasix IV - continue Entresto - Metoprolol Succinate 50 mg  Persistent atrial fibrillation - CHASDVASC 3 and s/p DCCV -History includes early return of atrial fibrillation after cardioversion on 9/30, given amiodarone load and repeat cardioversion on 09/19/2020 in sinus rhythm now.   - lovenox back to eliquis after LCP -On discharge will do 400 mg amio  daily for VT suppression - will need outpatient follow up for TFTs, LFTs, and PFTs  For questions or updates, please contact [place] Please consult [email or web address] for contact info under        Signed, Werner Lean, MD  09/23/2020, 8:46 AM

## 2020-09-23 NOTE — Progress Notes (Signed)
Electrophysiology Rounding Note  Patient Name: Gregory [name] Date of Encounter: 09/23/2020  Primary Cardiologist: Sanda Cedar Ditullio, MD Electrophysiologist: Dr. Caryl Comes   Subjective   Seen following recurrent ventricular tachycardia for which she was shocked this morning x2 and ATP successful x1.  No antecedent symptoms.  He has been standing up for just about a minute or 2 (see below).  He has no chest pain or shortenss of breath   Inpatient Medications    Scheduled Meds: . amiodarone  400 mg Oral Q12H   Followed by  . [START ON 10/07/2020] amiodarone  400 mg Oral Daily  . atorvastatin  80 mg Oral QHS  . Chlorhexidine Gluconate Cloth  6 each Topical Daily  . diltiazem  180 mg Oral Daily  . docusate sodium  100 mg Oral BID  . enoxaparin (LOVENOX) injection  1 mg/kg Subcutaneous Q12H  . insulin aspart  0-20 Units Subcutaneous TID WC  . insulin aspart  0-5 Units Subcutaneous QHS  . insulin detemir  5 Units Subcutaneous BID  . levETIRAcetam  500 mg Oral BID  . metoprolol succinate  50 mg Oral Daily  . pantoprazole  40 mg Oral QHS  . polyethylene glycol  17 g Oral Daily  . sacubitril-valsartan  1 tablet Oral BID   Continuous Infusions: . amiodarone 30 mg/hr (09/23/20 0500)   PRN Meds: acetaminophen, amiodarone, metoprolol tartrate, traZODone   Vital Signs    Vitals:   09/23/20 0000 09/23/20 0046 09/23/20 0300 09/23/20 0740  BP: 111/78  111/80 116/85  Pulse: 85 90 85   Resp: 12  12 16   Temp: 98.1 F (36.7 C)  97.8 F (36.6 C)   TempSrc: Oral Oral Oral   SpO2: 92%  98% 96%  Weight:   (!) 141.2 kg   Height:        Intake/Output Summary (Last 24 hours) at 09/23/2020 1246 Last data filed at 09/23/2020 1216 Gross per 24 hour  Intake 329.17 ml  Output 1450 ml  Net -1120.83 ml   Filed Weights   09/21/20 0452 09/22/20 0416 09/23/20 0300  Weight: (!) 139.8 kg (!) 137 kg (!) 141.2 kg    Physical Exam    Well developed and nourished in no acute  distress HENT normal Neck supple with JVP-   Clear Regular rate and rhythm, no murmurs or gallops Abd-soft with active BS No Clubbing cyanosis edema Skin-warm and dry A & Oriented  Grossly normal sensory and motor function      Labs    CBC Recent Labs    09/23/20 0107  WBC 10.1  HGB 10.3*  HCT 31.1*  MCV 100.6*  PLT 956   Basic Metabolic Panel Recent Labs    09/22/20 1137 09/23/20 0107  NA 136 134*  K 4.3 3.8  CL 97* 96*  CO2 28 32  GLUCOSE 199* 191*  BUN 12 12  CREATININE 0.99 0.85  CALCIUM 7.8* 7.7*  MG 1.7 1.8   Liver Function Tests No results for input(s): AST, ALT, ALKPHOS, BILITOT, PROT, ALBUMIN in the last 72 hours. No results for input(s): LIPASE, AMYLASE in the last 72 hours. Cardiac Enzymes No results for input(s): CKTOTAL, CKMB, CKMBINDEX, TROPONINI in the last 72 hours.   Telemetry    Sinus rhythm.  It was noted that in the 30-45 minutes prior to VT, there is an increase sinus rate and an increased PVC burden. The first beat of his tachycardia was similar to the ensuing beats suggesting a nonreentrant, triggered mechanism  Radiology    ECHOCARDIOGRAM LIMITED  Result Date: 09/23/2020    ECHOCARDIOGRAM LIMITED REPORT   Patient Name:   Gregory [name] Date of Exam: 09/22/2020 Medical Rec #:  [id number]                 Height:       78.0 in Accession #:    [id number]                Weight:       302.0 lb Date of Birth:  10-27-1961                 BSA:          2.690 m Patient Age:    2 years                  BP:           102/70 mmHg Patient Gender: M                         HR:           82 bpm. Exam Location:  Inpatient Procedure: Limited Color Doppler, Cardiac Doppler and Limited Echo Indications:    ventricular tachycardia  History:        Patient has prior history of Echocardiogram examinations, most                 recent 09/10/2020. CHF, CAD, Defibrillator, Arrythmias:Atrial                 Fibrillation; Risk Factors:Hypertension.   Sonographer:    Johny Chess Referring Phys: [id number] [place] A CHANDRASEKHAR IMPRESSIONS  1. Left ventricular ejection fraction, by estimation, is 45 to 50%. The left ventricle has mildly decreased function. The left ventricle demonstrates global hypokinesis. Left ventricular diastolic parameters are consistent with Grade I diastolic dysfunction (impaired relaxation).  2. Right ventricular systolic function was not well visualized. The right ventricular size is normal.  3. The mitral valve is grossly normal. No evidence of mitral valve regurgitation.  4. The aortic valve was not well visualized.  5. The inferior vena cava is normal in size with greater than 50% respiratory variability, suggesting right atrial pressure of 3 mmHg. Comparison(s): Slight decrease in LV function with different caliber (non-contrasted) study. Slight decrease in global LVEF. FINDINGS  Left Ventricle: Left ventricular ejection fraction, by estimation, is 45 to 50%. The left ventricle has mildly decreased function. The left ventricle demonstrates global hypokinesis. Left ventricular diastolic parameters are consistent with Grade I diastolic dysfunction (impaired relaxation). Right Ventricle: The right ventricular size is normal. Right ventricular systolic function was not well visualized. Pericardium: There is no evidence of pericardial effusion. Mitral Valve: The mitral valve is grossly normal. Tricuspid Valve: The tricuspid valve is not well visualized. Tricuspid valve regurgitation is trivial. Aortic Valve: The aortic valve was not well visualized. Pulmonic Valve: The pulmonic valve was not well visualized. Pulmonic valve regurgitation is not visualized. Aorta: The aortic root and ascending aorta are structurally normal, with no evidence of dilitation. Venous: The inferior vena cava is normal in size with greater than 50% respiratory variability, suggesting right atrial pressure of 3 mmHg. LEFT VENTRICLE PLAX 2D LVIDd:         5.60 cm   Diastology LVIDs:         4.70 cm  LV e' medial:    7.40 cm/s LV PW:  1.10 cm  LV E/e' medial:  7.0 LV IVS:        1.30 cm  LV e' lateral:   11.10 cm/s LVOT diam:     2.30 cm  LV E/e' lateral: 4.7 LV SV:         62 LV SV Index:   23 LVOT Area:     4.15 cm  AORTIC VALVE LVOT Vmax:   81.60 cm/s LVOT Vmean:  62.300 cm/s LVOT VTI:    0.149 m  AORTA Ao Root diam: 4.40 cm Ao Asc diam:  3.60 cm MITRAL VALVE MV Area (PHT): 3.03 cm    SHUNTS MV Decel Time: 250 msec    Systemic VTI:  0.15 m MV E velocity: 51.80 cm/s  Systemic Diam: 2.30 cm MV A velocity: 60.00 cm/s MV E/A ratio:  0.86 Rudean Haskell MD Electronically signed by Rudean Haskell MD Signature Date/Time: 09/23/2020/7:33:51 AM    Final     Patient Profile     59 y.o.malew/PMHx of CAD (knonw chronic occl of RCA with prior PCIs to LAD, diag, and LCx), VT, ICM, OSA w/CPAP, seizures, chronic CHF, HLD, HTN, obesity  admitted to [facility] 9/26 with progressive weakness, orthostatic dizziness, and a syncopal event, He was markedly hypotensive, started on IVF with agressive replacement as well a levophed, felt to be in shock (unceratin etiology septic vs hypovolemic) also noted to be in new AFib  Assessment & Plan     New Afib in setting of shock   Shock   CAD with prior PCI  Ventricular tachycardia-recurrent-monomorphic   CHF chronic-diastolic   Will begin heparin and plan cath in ami Continue amiodarone   Virl Axe, MD  09/23/2020, 12:46 PM

## 2020-09-23 NOTE — Progress Notes (Signed)
Spoke with Dr Isabell Jarvis to verify ok to transition patient from IV amiodarone to po amiodarone

## 2020-09-23 NOTE — Progress Notes (Signed)
[facility] PROGRESS NOTE    Progress Note  Laster Appling  XNA:[id number] DOB: Nov 28, 1961 DOA: 09/09/2020 PCP: Patient, No Pcp Per     Brief Narrative:   Kasem Mozer is an 59 y.o. male past medical history of coronary artery disease, chronic diastolic heart failure, ischemic cardiomyopathy essential hypertension status post cardiac defibrillation who initially presented to the ED with nausea vomiting and diarrhea was found to be in acute kidney injury requiring vasopressors initially admitted to the ICU infectious work-up turned out to be negative.  He then developed A. 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 EP was consulted and recommended DCCV on 09/19/2020. Patient did develop acute confusion, leukocytosis and was suspected to be in sepsis due to UTI started empirically on antibiotics.  Assessment/Plan:   Ventricular fibrillation status post AICD and CAD/A. fib with RVR CHADS Vasc score greater than 4. Had a burst of fast heart rate, cardiology changes metoprolol started him loading with amiodarone. EP is following giving birth episode of VT without a trigger they plan for cardiac cath on 09/24/2020 He is currently on Eliquis which continue as an outpatient. Patient is medically stable for transfer Worker's Comp. has been submitted by CIR for prior authorization as well Eliquis.   Persistent atrial fibrillation with RVR: With a chads Vascor greater than 4 he is status post cardioversion x2 He was loaded with amiodarone and successfully cardioverted. Now in sinus rhythm and Eliquis, on home he will have to go on 400 mg of amiodarone daily.  Acute confusional state/acute encephalopathy: Likely due to infectious etiology.  CT unremarkable unable to do MRI due to AICD. His  mental status has  Improved,has been neurologically intact.  Severe sepsis due to UTI: He was started on IV Rocephin his encephalopathy resolved. He will complete his course of oral antibiotics in house. Culture data has remained negative till date.  Acute on chronic diastolic heart failure: Now appears euvolemic he was started on IV Lasix. He continues to diurese about 2 L a day.  Is currently on 60 of Lasix IV daily. He is probably coming close to his dry weight he is becoming hyponatremic and hypochloremic. Continue Entresto and beta-blockers.  Acute kidney injury: Likely prerenal in the setting of A. fib with RVR with a baseline less than 1. Resolved with IV fluid hydration.    Thrombocytopenia: After starting heparin head bodies were negative there is no evidence of bleeding after stopping heparin his platelets are improving.  Hypocalcemia: Was giving calcium gluconate now resolved.  Coronary artery disease: Status post AICD, denies any chest pain or shortness of breath.  Macrocytic anemia: Folate of 300, B12 was 683. Check an anemia panel.  Chronic combined systolic and diastolic heart failure (HCC) Appears euvolemic on physical exam KVO IV fluids last echo showed an EF of 55%.  Obstructive sleep apnea: Continue CPAP at night.  Mild transaminitis: Abdominal ultrasound was negative for acute cholecystitis he denies any abdominal pain. Likely cardiovascular mediated now resolved.  Newly diagnosed diabetes mellitus type 2: Check an A1c he was started on long-acting insulin plus sliding scale we will increase long-acting insulin his blood glucose not controlled.  Seizures: Continue Keppra.   DVT prophylaxis: Eliquis Family Communication:none Status is: Inpatient  Remains inpatient appropriate because:Hemodynamically unstable   Dispo: The patient is from: Home  Anticipated d/c is to: CIR              Anticipated d/c date is: 2 days               Patient currently patient is medically stable transferred to CIR.  Awaiting CIR recommendations.        Code Status:     Code Status Orders  (From admission, onward)         Start     Ordered   09/09/20 1642  Full code  Continuous        09/09/20 1643        Code Status History    Date Active Date Inactive Code Status Order ID Comments User Context   06/07/2018 2227 06/09/2018 1730 Full Code [id number]  Jani Gravel, MD Inpatient   12/19/2016 1651 12/19/2016 2113 Full Code [id number]  [name], MD Inpatient   05/04/2016 2100 05/07/2016 2158 Full Code [id number]  Jerline Pain, MD Inpatient   Advance Care Planning Activity        IV Access:    Peripheral IV   Procedures and diagnostic studies:   ECHOCARDIOGRAM LIMITED  Result Date: 09/23/2020    ECHOCARDIOGRAM LIMITED REPORT   Patient Name:   TARUN PATCHELL Date of Exam: 09/22/2020 Medical Rec #:  [id number]                 Height:       78.0 in Accession #:    [id number]                Weight:       302.0 lb Date of Birth:  [birth year]                 BSA:          2.690 m Patient Age:    43 years                  BP:           102/70 mmHg Patient Gender: M                         HR:           82 bpm. Exam Location:  Inpatient Procedure: Limited Color Doppler, Cardiac Doppler and Limited Echo Indications:    ventricular tachycardia  History:        Patient has prior history of Echocardiogram examinations, most                 recent 09/10/2020. CHF, CAD, Defibrillator, Arrythmias:Atrial                 Fibrillation; Risk Factors:Hypertension.  Sonographer:    Johny Chess Referring Phys: [id number] [place] A CHANDRASEKHAR IMPRESSIONS  1. Left ventricular ejection fraction, by estimation, is 45 to 50%. The left ventricle has mildly decreased function. The left ventricle demonstrates global hypokinesis. Left ventricular diastolic parameters are consistent with Grade I diastolic dysfunction (impaired relaxation).  2.  Right ventricular systolic function was not well visualized. The right ventricular size is normal.  3. The mitral valve is grossly normal. No evidence of mitral valve regurgitation.  4. The aortic valve was not well visualized.  5. The inferior vena cava is normal in size with greater than 50% respiratory variability, suggesting right atrial pressure of 3 mmHg. Comparison(s): Slight decrease in LV function with different caliber (non-contrasted) study. Slight decrease in  global LVEF. FINDINGS  Left Ventricle: Left ventricular ejection fraction, by estimation, is 45 to 50%. The left ventricle has mildly decreased function. The left ventricle demonstrates global hypokinesis. Left ventricular diastolic parameters are consistent with Grade I diastolic dysfunction (impaired relaxation). Right Ventricle: The right ventricular size is normal. Right ventricular systolic function was not well visualized. Pericardium: There is no evidence of pericardial effusion. Mitral Valve: The mitral valve is grossly normal. Tricuspid Valve: The tricuspid valve is not well visualized. Tricuspid valve regurgitation is trivial. Aortic Valve: The aortic valve was not well visualized. Pulmonic Valve: The pulmonic valve was not well visualized. Pulmonic valve regurgitation is not visualized. Aorta: The aortic root and ascending aorta are structurally normal, with no evidence of dilitation. Venous: The inferior vena cava is normal in size with greater than 50% respiratory variability, suggesting right atrial pressure of 3 mmHg. LEFT VENTRICLE PLAX 2D LVIDd:         5.60 cm  Diastology LVIDs:         4.70 cm  LV e' medial:    7.40 cm/s LV PW:         1.10 cm  LV E/e' medial:  7.0 LV IVS:        1.30 cm  LV e' lateral:   11.10 cm/s LVOT diam:     2.30 cm  LV E/e' lateral: 4.7 LV SV:         62 LV SV Index:   23 LVOT Area:     4.15 cm  AORTIC VALVE LVOT Vmax:   81.60 cm/s LVOT Vmean:  62.300 cm/s LVOT VTI:    0.149 m  AORTA Ao Root diam: 4.40 cm  Ao Asc diam:  3.60 cm MITRAL VALVE MV Area (PHT): 3.03 cm    SHUNTS MV Decel Time: 250 msec    Systemic VTI:  0.15 m MV E velocity: 51.80 cm/s  Systemic Diam: 2.30 cm MV A velocity: 60.00 cm/s MV E/A ratio:  0.86 Rudean Haskell MD Electronically signed by Rudean Haskell MD Signature Date/Time: 09/23/2020/7:33:51 AM    Final      Medical Consultants:    None.  Anti-Infectives:   keflex  Subjective:    Jaben Washington Siegfried no new complaints feels great.  Objective:    Vitals:   09/23/20 0000 09/23/20 0046 09/23/20 0300 09/23/20 0740  BP: 111/78  111/80 116/85  Pulse: 85 90 85   Resp: 12  12 16   Temp: 98.1 F (36.7 C)  97.8 F (36.6 C)   TempSrc: Oral Oral Oral   SpO2: 92%  98% 96%  Weight:   (!) 141.2 kg   Height:       SpO2: 96 % O2 Flow Rate (L/min): 2 L/min   Intake/Output Summary (Last 24 hours) at 09/23/2020 0951 Last data filed at 09/23/2020 0500 Gross per 24 hour  Intake 329.17 ml  Output 1550 ml  Net -1220.83 ml   Filed Weights   09/21/20 0452 09/22/20 0416 09/23/20 0300  Weight: (!) 139.8 kg (!) 137 kg (!) 141.2 kg    Exam: General exam: In no acute distress. Respiratory system: Good air movement and clear to auscultation. Cardiovascular system: S1 & S2 heard, RRR. No JVD. Gastrointestinal system: Abdomen is nondistended, soft and nontender.  Extremities: No pedal edema. Skin: No rashes, lesions or ulcers  Data Reviewed:    Labs: Basic Metabolic Panel: Recent Labs  Lab 09/17/20 0544 09/17/20 0544 09/18/20 0500 09/18/20 0500 09/19/20 0415 09/19/20 0415 09/21/20 0445 09/21/20  8546 09/22/20 2703 09/22/20 0637 09/22/20 1137 09/23/20 0107  NA 140   < > 135   < > 135  --  136  --  137  --  136 134*  K 4.6   < > 4.1   < > 4.6   < > 4.4   < > 4.3   < > 4.3 3.8  CL 103   < > 100   < > 99  --  99  --  98  --  97* 96*  CO2 29   < > 28   < > 28  --  28  --  29  --  28 32  GLUCOSE 141*   < > 197*   < > 185*  --  195*  --   148*  --  199* 191*  BUN 22   < > 21   < > 14  --  11  --  12  --  12 12  CREATININE 1.10   < > 1.00   < > 0.94  --  0.88  --  0.89  --  0.99 0.85  CALCIUM 8.2*   < > 7.8*   < > 7.8*  --  7.8*  --  7.8*  --  7.8* 7.7*  MG 2.1  --  2.0  --   --   --   --   --   --   --  1.7 1.8   < > = values in this interval not displayed.   GFR Estimated Creatinine Clearance: 143.7 mL/min (by C-G formula based on SCr of 0.85 mg/dL). Liver Function Tests: No results for input(s): AST, ALT, ALKPHOS, BILITOT, PROT, ALBUMIN in the last 168 hours. No results for input(s): LIPASE, AMYLASE in the last 168 hours. No results for input(s): AMMONIA in the last 168 hours. Coagulation profile Recent Labs  Lab 09/19/20 0546  INR 1.5*   COVID-19 Labs  No results for input(s): DDIMER, FERRITIN, LDH, CRP in the last 72 hours.  Lab Results  Component Value Date   [place] NEGATIVE 09/09/2020   [place] Not Detected 12/05/2019    CBC: Recent Labs  Lab 09/17/20 0630 09/18/20 0500 09/19/20 0415 09/23/20 0107  WBC 17.8* 14.4* 12.4* 10.1  NEUTROABS  --  13.5* 10.6*  --   HGB 11.8* 11.1* 10.8* 10.3*  HCT 37.1* 34.2* 32.9* 31.1*  MCV 105.1* 104.0* 104.1* 100.6*  PLT 187 203 199 279   Cardiac Enzymes: No results for input(s): CKTOTAL, CKMB, CKMBINDEX, TROPONINI in the last 168 hours. BNP (last 3 results) No results for input(s): PROBNP in the last 8760 hours. CBG: Recent Labs  Lab 09/22/20 0606 09/22/20 1130 09/22/20 1559 09/22/20 2114 09/23/20 0614  GLUCAP 178* 211* 150* 176* 166*   D-Dimer: No results for input(s): DDIMER in the last 72 hours. Hgb A1c: Recent Labs    09/21/20 1130  HGBA1C 6.9*   Lipid Profile: No results for input(s): CHOL, HDL, LDLCALC, TRIG, CHOLHDL, LDLDIRECT in the last 72 hours. Thyroid function studies: No results for input(s): TSH, T4TOTAL, T3FREE, THYROIDAB in the last 72 hours.  Invalid input(s): FREET3 Anemia work up: No results for input(s):  VITAMINB12, FOLATE, FERRITIN, TIBC, IRON, RETICCTPCT in the last 72 hours. Sepsis Labs: Recent Labs  Lab 09/17/20 0630 09/18/20 0500 09/19/20 0415 09/23/20 0107  WBC 17.8* 14.4* 12.4* 10.1   Microbiology Recent Results (from the past 240 hour(s))  Culture, blood (Routine X 2) w Reflex to ID Panel  Status: None   Collection Time: 09/16/20 10:10 AM   Specimen: BLOOD RIGHT HAND  Result Value Ref Range Status   Specimen Description BLOOD RIGHT HAND  Final   Special Requests   Final    BOTTLES DRAWN AEROBIC ONLY Blood Culture results may not be optimal due to an inadequate volume of blood received in culture bottles   Culture   Final    NO GROWTH 5 DAYS Performed at [facility], [place] [address], [place], [place] [address]    Report Status 09/21/2020 FINAL  Final  Culture, blood (Routine X 2) w Reflex to ID Panel     Status: None   Collection Time: 09/16/20 10:10 AM   Specimen: BLOOD RIGHT HAND  Result Value Ref Range Status   Specimen Description BLOOD RIGHT HAND  Final   Special Requests   Final    BOTTLES DRAWN AEROBIC ONLY Blood Culture results may not be optimal due to an inadequate volume of blood received in culture bottles   Culture   Final    NO GROWTH 5 DAYS Performed at [facility], [place] [address] [name], [place] [address]    Report Status 09/21/2020 FINAL  Final     Medications:   . amiodarone  400 mg Oral Q12H   Followed by  . [START ON 10/07/2020] amiodarone  400 mg Oral Daily  . atorvastatin  80 mg Oral QHS  . Chlorhexidine Gluconate Cloth  6 each Topical Daily  . diltiazem  180 mg Oral Daily  . docusate sodium  100 mg Oral BID  . enoxaparin (LOVENOX) injection  1 mg/kg Subcutaneous Q12H  . furosemide  60 mg Intravenous Once  . insulin aspart  0-20 Units Subcutaneous TID WC  . insulin aspart  0-5 Units Subcutaneous QHS  . insulin detemir  5 Units Subcutaneous BID  . levETIRAcetam  500 mg Oral BID  . metoprolol succinate  50 mg Oral  Daily  . pantoprazole  40 mg Oral QHS  . polyethylene glycol  17 g Oral Daily  . sacubitril-valsartan  1 tablet Oral BID   Continuous Infusions: . amiodarone 30 mg/hr (09/23/20 0500)      LOS: 14 days   Charlynne Cousins  [facility]  09/23/2020, 9:51 AM

## 2020-09-24 ENCOUNTER — Other Ambulatory Visit: Payer: Self-pay

## 2020-09-24 ENCOUNTER — Encounter (HOSPITAL_COMMUNITY)
Admission: EM | Disposition: A | Discharge status: Rehabilitation Facility | Payer: Self-pay | Source: Home / Self Care | Attending: Internal Medicine

## 2020-09-24 DIAGNOSIS — I25118 Atherosclerotic heart disease of native coronary artery with other forms of angina pectoris: Secondary | ICD-10-CM

## 2020-09-24 HISTORY — PX: RIGHT/LEFT HEART CATH AND CORONARY ANGIOGRAPHY: CATH118266

## 2020-09-24 LAB — GLUCOSE, CAPILLARY
Glucose-Capillary: 156 mg/dL — ABNORMAL HIGH (ref 70–99)
Glucose-Capillary: 110 mg/dL — ABNORMAL HIGH (ref 70–99)
Glucose-Capillary: 100 mg/dL — ABNORMAL HIGH (ref 70–99)
Glucose-Capillary: 236 mg/dL — ABNORMAL HIGH (ref 70–99)

## 2020-09-24 LAB — ECHOCARDIOGRAM LIMITED
Area-P 1/2: 3.03 cm2
Height: 78 in
S' Lateral: 4.7 cm
Weight: 4832.48 oz

## 2020-09-24 LAB — POCT I-STAT 7, (LYTES, BLD GAS, ICA,H+H)
Acid-Base Excess: 8 mmol/L — ABNORMAL HIGH (ref 0.0–2.0)
Bicarbonate: 35.3 mmol/L — ABNORMAL HIGH (ref 20.0–28.0)
Calcium, Ion: 1.16 mmol/L (ref 1.15–1.40)
HCT: 31 % — ABNORMAL LOW (ref 39.0–52.0)
Hemoglobin: 10.5 g/dL — ABNORMAL LOW (ref 13.0–17.0)
O2 Saturation: 96 %
Potassium: 4 mmol/L (ref 3.5–5.1)
Sodium: 139 mmol/L (ref 135–145)
TCO2: 37 mmol/L — ABNORMAL HIGH (ref 22–32)
pCO2 arterial: 62.5 mmHg — ABNORMAL HIGH (ref 32.0–48.0)
pH, Arterial: 7.36 (ref 7.350–7.450)
pO2, Arterial: 87 mmHg (ref 83.0–108.0)

## 2020-09-24 LAB — BASIC METABOLIC PANEL
Anion gap: 8 (ref 5–15)
BUN: 9 mg/dL (ref 8–23)
CO2: 31 mmol/L (ref 22–32)
Calcium: 7.7 mg/dL — ABNORMAL LOW (ref 8.9–10.3)
Chloride: 97 mmol/L — ABNORMAL LOW (ref 98–111)
Creatinine, Ser: 0.88 mg/dL (ref 0.61–1.24)
GFR, Estimated: >60 mL/min (ref >60)
Glucose, Bld: 190 mg/dL — ABNORMAL HIGH (ref 70–99)
Potassium: 3.7 mmol/L (ref 3.5–5.1)
Sodium: 136 mmol/L (ref 135–145)

## 2020-09-24 LAB — CBC
HCT: 31.1 % — ABNORMAL LOW (ref 39.0–52.0)
Hemoglobin: 10.1 g/dL — ABNORMAL LOW (ref 13.0–17.0)
MCH: 33 pg (ref 26.0–34.0)
MCHC: 32.5 g/dL (ref 30.0–36.0)
MCV: 101.6 fL — ABNORMAL HIGH (ref 80.0–100.0)
Platelets: 262 10*3/uL (ref 150–400)
RBC: 3.06 MIL/uL — ABNORMAL LOW (ref 4.22–5.81)
RDW: 17.2 % — ABNORMAL HIGH (ref 11.5–15.5)
WBC: 10 10*3/uL (ref 4.0–10.5)
nRBC: 0 % (ref 0.0–0.2)

## 2020-09-24 LAB — POCT I-STAT EG7
Acid-Base Excess: 9 mmol/L — ABNORMAL HIGH (ref 0.0–2.0)
Acid-Base Excess: 9 mmol/L — ABNORMAL HIGH (ref 0.0–2.0)
Bicarbonate: 35.9 mmol/L — ABNORMAL HIGH (ref 20.0–28.0)
Bicarbonate: 36.4 mmol/L — ABNORMAL HIGH (ref 20.0–28.0)
Calcium, Ion: 1.17 mmol/L (ref 1.15–1.40)
Calcium, Ion: 1.18 mmol/L (ref 1.15–1.40)
HCT: 32 % — ABNORMAL LOW (ref 39.0–52.0)
HCT: 34 % — ABNORMAL LOW (ref 39.0–52.0)
Hemoglobin: 10.9 g/dL — ABNORMAL LOW (ref 13.0–17.0)
Hemoglobin: 11.6 g/dL — ABNORMAL LOW (ref 13.0–17.0)
O2 Saturation: 67 %
O2 Saturation: 69 %
Potassium: 4.1 mmol/L (ref 3.5–5.1)
Potassium: 4.2 mmol/L (ref 3.5–5.1)
Sodium: 138 mmol/L (ref 135–145)
Sodium: 139 mmol/L (ref 135–145)
TCO2: 38 mmol/L — ABNORMAL HIGH (ref 22–32)
TCO2: 38 mmol/L — ABNORMAL HIGH (ref 22–32)
pCO2, Ven: 64.6 mmHg — ABNORMAL HIGH (ref 44.0–60.0)
pCO2, Ven: 67.2 mmHg — ABNORMAL HIGH (ref 44.0–60.0)
pH, Ven: 7.342 (ref 7.250–7.430)
pH, Ven: 7.353 (ref 7.250–7.430)
pO2, Ven: 38 mmHg (ref 32.0–45.0)
pO2, Ven: 39 mmHg (ref 32.0–45.0)

## 2020-09-24 LAB — MAGNESIUM: Magnesium: 1.7 mg/dL (ref 1.7–2.4)

## 2020-09-24 LAB — SARS CORONAVIRUS 2 BY RT PCR (HOSPITAL ORDER, PERFORMED IN ~~LOC~~ HOSPITAL LAB): SARS Coronavirus 2: NEGATIVE

## 2020-09-24 SURGERY — RIGHT/LEFT HEART CATH AND CORONARY ANGIOGRAPHY
Anesthesia: LOCAL

## 2020-09-24 MED ORDER — ACETAMINOPHEN 325 MG PO TABS: 650.0000 mg | ORAL_TABLET | ORAL | Status: DC | PRN

## 2020-09-24 MED ORDER — APIXABAN 5 MG PO TABS: 5.0000 mg | ORAL_TABLET | Freq: Two times a day (BID) | ORAL | Status: DC

## 2020-09-24 MED ORDER — ASPIRIN 81 MG PO CHEW: 81.0000 mg | CHEWABLE_TABLET | Freq: Every day | ORAL | Status: DC

## 2020-09-24 MED ORDER — APIXABAN 5 MG PO TABS
5.0000 mg | ORAL_TABLET | Freq: Two times a day (BID) | ORAL | Status: DC
  Administered 2020-09-25 – 2020-09-29 (×9): 5 mg via ORAL
  Filled 2020-09-24 (×9): qty 1

## 2020-09-24 MED ORDER — ONDANSETRON HCL 4 MG/2ML IJ SOLN: 4.0000 mg | Freq: Four times a day (QID) | INTRAMUSCULAR | Status: DC | PRN

## 2020-09-24 MED ORDER — HYDRALAZINE HCL 20 MG/ML IJ SOLN: 10.0000 mg | INTRAMUSCULAR | Status: AC | PRN

## 2020-09-24 MED ORDER — CLOPIDOGREL BISULFATE 75 MG PO TABS: 75.0000 mg | ORAL_TABLET | Freq: Every day | ORAL | Status: DC

## 2020-09-24 MED ORDER — MIDAZOLAM HCL 2 MG/2ML IJ SOLN
INTRAMUSCULAR | Status: DC | PRN
  Administered 2020-09-24 (×2): 1 mg via INTRAVENOUS

## 2020-09-24 MED ORDER — CARVEDILOL PHOSPHATE ER 20 MG PO CP24
80.0000 mg | ORAL_CAPSULE | Freq: Every day | ORAL | Status: DC
  Administered 2020-09-24 – 2020-09-29 (×6): 80 mg via ORAL
  Filled 2020-09-24 (×4): qty 4
  Filled 2020-09-24: qty 1
  Filled 2020-09-24: qty 4
  Filled 2020-09-24: qty 1
  Filled 2020-09-24: qty 4

## 2020-09-24 MED ORDER — HEPARIN (PORCINE) IN NACL 1000-0.9 UT/500ML-% IV SOLN
INTRAVENOUS | Status: AC
  Filled 2020-09-24: qty 1000

## 2020-09-24 MED ORDER — LIDOCAINE HCL (PF) 1 % IJ SOLN
INTRAMUSCULAR | Status: DC | PRN
  Administered 2020-09-24 (×2): 2 mL
  Administered 2020-09-24: 20 mL

## 2020-09-24 MED ORDER — CLOPIDOGREL BISULFATE 75 MG PO TABS
75.0000 mg | ORAL_TABLET | Freq: Every day | ORAL | Status: DC
  Administered 2020-09-25 – 2020-09-29 (×5): 75 mg via ORAL
  Filled 2020-09-24 (×5): qty 1

## 2020-09-24 MED ORDER — POTASSIUM CHLORIDE CRYS ER 20 MEQ PO TBCR
40.0000 meq | EXTENDED_RELEASE_TABLET | Freq: Two times a day (BID) | ORAL | Status: AC
  Administered 2020-09-24 (×2): 40 meq via ORAL
  Filled 2020-09-24 (×2): qty 2

## 2020-09-24 MED ORDER — HEPARIN SODIUM (PORCINE) 1000 UNIT/ML IJ SOLN
INTRAMUSCULAR | Status: DC | PRN
  Administered 2020-09-24: 4000 [IU] via INTRAVENOUS

## 2020-09-24 MED ORDER — ENOXAPARIN SODIUM 150 MG/ML ~~LOC~~ SOLN: 1.0000 mg/kg | Freq: Two times a day (BID) | SUBCUTANEOUS | Status: DC

## 2020-09-24 MED ORDER — FENTANYL CITRATE (PF) 100 MCG/2ML IJ SOLN
INTRAMUSCULAR | Status: AC
  Filled 2020-09-24: qty 2

## 2020-09-24 MED ORDER — HEPARIN (PORCINE) IN NACL 1000-0.9 UT/500ML-% IV SOLN
INTRAVENOUS | Status: DC | PRN
  Administered 2020-09-24 (×2): 500 mL

## 2020-09-24 MED ORDER — POTASSIUM CHLORIDE CRYS ER 10 MEQ PO TBCR
10.0000 meq | EXTENDED_RELEASE_TABLET | Freq: Every day | ORAL | Status: DC
  Administered 2020-09-25 – 2020-09-29 (×5): 10 meq via ORAL
  Filled 2020-09-24 (×5): qty 1

## 2020-09-24 MED ORDER — FUROSEMIDE 40 MG PO TABS
40.0000 mg | ORAL_TABLET | Freq: Every day | ORAL | Status: DC
  Administered 2020-09-25 – 2020-09-26 (×2): 40 mg via ORAL
  Filled 2020-09-24 (×2): qty 1

## 2020-09-24 MED ORDER — ASPIRIN 81 MG PO TABS: 81.0000 mg | ORAL_TABLET | Freq: Every day | ORAL | Status: DC

## 2020-09-24 MED ORDER — SACUBITRIL-VALSARTAN 97-103 MG PO TABS
1.0000 | ORAL_TABLET | Freq: Two times a day (BID) | ORAL | Status: DC
  Administered 2020-09-24 – 2020-09-29 (×10): 1 via ORAL
  Filled 2020-09-24 (×11): qty 1

## 2020-09-24 MED ORDER — SODIUM CHLORIDE 0.9 % IV SOLN: INTRAVENOUS | Status: AC

## 2020-09-24 MED ORDER — LEVETIRACETAM 500 MG PO TABS
500.0000 mg | ORAL_TABLET | Freq: Two times a day (BID) | ORAL | Status: DC
  Administered 2020-09-24 – 2020-09-29 (×10): 500 mg via ORAL
  Filled 2020-09-24 (×10): qty 1

## 2020-09-24 MED ORDER — LABETALOL HCL 5 MG/ML IV SOLN: 10.0000 mg | INTRAVENOUS | Status: AC | PRN

## 2020-09-24 MED ORDER — ASPIRIN 81 MG PO CHEW
81.0000 mg | CHEWABLE_TABLET | Freq: Every day | ORAL | Status: DC
  Administered 2020-09-25: 81 mg via ORAL
  Filled 2020-09-24: qty 1

## 2020-09-24 MED ORDER — MIDAZOLAM HCL 2 MG/2ML IJ SOLN
INTRAMUSCULAR | Status: AC
  Filled 2020-09-24: qty 2

## 2020-09-24 MED ORDER — HEPARIN SODIUM (PORCINE) 1000 UNIT/ML IJ SOLN
INTRAMUSCULAR | Status: AC
  Filled 2020-09-24: qty 1

## 2020-09-24 MED ORDER — SODIUM CHLORIDE 0.9% FLUSH: 3.0000 mL | INTRAVENOUS | Status: DC | PRN

## 2020-09-24 MED ORDER — IOHEXOL 350 MG/ML SOLN
INTRAVENOUS | Status: DC | PRN
  Administered 2020-09-24: 75 mL

## 2020-09-24 MED ORDER — PANTOPRAZOLE SODIUM 40 MG PO TBEC
40.0000 mg | DELAYED_RELEASE_TABLET | Freq: Every day | ORAL | Status: DC
  Administered 2020-09-24 – 2020-09-29 (×6): 40 mg via ORAL
  Filled 2020-09-24 (×6): qty 1

## 2020-09-24 MED ORDER — SODIUM CHLORIDE 0.9% FLUSH
3.0000 mL | Freq: Two times a day (BID) | INTRAVENOUS | Status: DC
  Administered 2020-09-24 – 2020-09-28 (×8): 3 mL via INTRAVENOUS

## 2020-09-24 MED ORDER — VERAPAMIL HCL 2.5 MG/ML IV SOLN
INTRAVENOUS | Status: AC
  Filled 2020-09-24: qty 2

## 2020-09-24 MED ORDER — ENOXAPARIN SODIUM 150 MG/ML ~~LOC~~ SOLN: 140.0000 mg | Freq: Two times a day (BID) | SUBCUTANEOUS | Status: DC

## 2020-09-24 MED ORDER — MORPHINE SULFATE (PF) 2 MG/ML IV SOLN
2.0000 mg | INTRAVENOUS | Status: DC | PRN
  Administered 2020-09-27 – 2020-09-28 (×2): 2 mg via INTRAVENOUS
  Filled 2020-09-24 (×2): qty 1

## 2020-09-24 MED ORDER — MAGNESIUM SULFATE 2 GM/50ML IV SOLN
2.0000 g | Freq: Once | INTRAVENOUS | Status: AC
  Administered 2020-09-24: 2 g via INTRAVENOUS
  Filled 2020-09-24: qty 50

## 2020-09-24 MED ORDER — HYDROCHLOROTHIAZIDE 25 MG PO TABS: 25.0000 mg | ORAL_TABLET | Freq: Every day | ORAL | Status: DC

## 2020-09-24 MED ORDER — METOPROLOL SUCCINATE ER 25 MG PO TB24
25.0000 mg | ORAL_TABLET | Freq: Every day | ORAL | Status: DC
  Administered 2020-09-25: 25 mg via ORAL
  Filled 2020-09-24: qty 1

## 2020-09-24 MED ORDER — LIDOCAINE HCL (PF) 1 % IJ SOLN
INTRAMUSCULAR | Status: AC
  Filled 2020-09-24: qty 30

## 2020-09-24 MED ORDER — SODIUM CHLORIDE 0.9 % IV SOLN: 250.0000 mL | INTRAVENOUS | Status: DC | PRN

## 2020-09-24 MED ORDER — NITROGLYCERIN 1 MG/10 ML FOR IR/CATH LAB
INTRA_ARTERIAL | Status: AC
  Filled 2020-09-24: qty 10

## 2020-09-24 MED ORDER — ATORVASTATIN CALCIUM 80 MG PO TABS: 80.0000 mg | ORAL_TABLET | Freq: Every day | ORAL | Status: DC

## 2020-09-24 MED ORDER — FENTANYL CITRATE (PF) 100 MCG/2ML IJ SOLN
INTRAMUSCULAR | Status: DC | PRN
Start: 2020-09-24 — End: 2020-09-24
  Administered 2020-09-24 (×2): 25 ug via INTRAVENOUS

## 2020-09-24 MED ORDER — VERAPAMIL HCL 2.5 MG/ML IV SOLN
INTRA_ARTERIAL | Status: DC | PRN
  Administered 2020-09-24: 5 mL via INTRA_ARTERIAL

## 2020-09-24 SURGICAL SUPPLY — 19 items
CATH BALLN WEDGE 5F 110CM (CATHETERS) ×1 IMPLANT
CATH INFINITI 5 FR JL3.5 (CATHETERS) ×1 IMPLANT
CATH INFINITI 5F JL4 125CM (CATHETERS) ×1 IMPLANT
CATH OPTITORQUE TIG 4.0 5F (CATHETERS) ×1 IMPLANT
CATH SWAN GANZ 7F STRAIGHT (CATHETERS) ×1 IMPLANT
CLOSURE MYNX CONTROL 6F/7F (Vascular Products) ×1 IMPLANT
DEVICE RAD COMP TR BAND LRG (VASCULAR PRODUCTS) ×1 IMPLANT
GLIDESHEATH SLEND A-KIT 6F 22G (SHEATH) ×1 IMPLANT
GUIDEWIRE INQWIRE 1.5J.035X260 (WIRE) IMPLANT
HOVERMATT SINGLE USE (MISCELLANEOUS) ×1 IMPLANT
INQWIRE 1.5J .035X260CM (WIRE) ×4
KIT HEART LEFT (KITS) ×2 IMPLANT
PACK CARDIAC CATHETERIZATION (CUSTOM PROCEDURE TRAY) ×2 IMPLANT
SHEATH GLIDE SLENDER 4/5FR (SHEATH) ×1 IMPLANT
SHEATH PINNACLE 7F 10CM (SHEATH) ×1 IMPLANT
TRANSDUCER W/STOPCOCK (MISCELLANEOUS) ×2 IMPLANT
TUBING CIL FLEX 10 FLL-RA (TUBING) ×2 IMPLANT
WIRE EMERALD 3MM-J .025X260CM (WIRE) ×1 IMPLANT
WIRE HI TORQ VERSACORE-J 145CM (WIRE) ×1 IMPLANT

## 2020-09-24 NOTE — Progress Notes (Signed)
Pt received from cath lab. VSS. R groin level 0. R radial w/ TR band in place. Call light in reach.  Clyde Canterbury, RN

## 2020-09-24 NOTE — Interval H&P Note (Signed)
Cath Lab Visit (complete for each Cath Lab visit)  Clinical Evaluation Leading to the Procedure:   ACS: No.  Non-ACS:    Anginal Classification: No Symptoms  Anti-ischemic medical therapy: Maximal Therapy (2 or more classes of medications)  Non-Invasive Test Results: No non-invasive testing performed  Prior CABG: No previous CABG      History and Physical Interval Note:  09/24/2020 2:21 PM  Gregory [name]  has presented today for surgery, with the diagnosis of unstable.  The various methods of treatment have been discussed with the patient and family. After consideration of risks, benefits and other options for treatment, the patient has consented to  Procedure(s): RIGHT/LEFT HEART CATH AND CORONARY ANGIOGRAPHY (N/A) as a surgical intervention.  The patient's history has been reviewed, patient examined, no change in status, stable for surgery.  I have reviewed the patient's chart and labs.  Questions were answered to the patient's satisfaction.     Gregory [name]

## 2020-09-24 NOTE — Progress Notes (Signed)
Progress Note  Patient Name: Gregory [name] Date of Encounter: 09/24/2020  Primary Cardiologist: Sanda Klein, MD   Subjective   [facility] [name] a 59 y.o.malewith a hx of CAD (known chronic occl of RCA with prior PCIs to LAD, diag, and LCx)), CHF(HFmrEF with evaluation as low as 45%, but recently 60-65%)and VT s/p ICDwho is being seen after shockat the request of Dr. Sabra Heck.  Had new atrial fibrillation.  Initially seen 09/09/20.  DCCV Twice through course.  No events overnight.  Transitioned to PO Amiodarone.  Patient is nervous but optimistic about getting better.  No palpitations or SOB.  Inpatient Medications    Scheduled Meds: . amiodarone  400 mg Oral Q12H   Followed by  . [START ON 10/07/2020] amiodarone  400 mg Oral Daily  . atorvastatin  80 mg Oral QHS  . Chlorhexidine Gluconate Cloth  6 each Topical Daily  . diltiazem  180 mg Oral Daily  . docusate sodium  100 mg Oral BID  . enoxaparin (LOVENOX) injection  1 mg/kg Subcutaneous Q12H  . insulin aspart  0-20 Units Subcutaneous TID WC  . insulin aspart  0-5 Units Subcutaneous QHS  . insulin detemir  5 Units Subcutaneous BID  . levETIRAcetam  500 mg Oral BID  . metoprolol succinate  50 mg Oral Daily  . pantoprazole  40 mg Oral QHS  . polyethylene glycol  17 g Oral Daily  . sacubitril-valsartan  1 tablet Oral BID  . sodium chloride flush  3 mL Intravenous Q12H  . sodium chloride flush  3 mL Intravenous Q12H   Continuous Infusions: . sodium chloride    . sodium chloride    . sodium chloride 10 mL/hr at 09/24/20 0511  . sodium chloride    . amiodarone Stopped (09/24/20 0000)   PRN Meds: sodium chloride, sodium chloride, acetaminophen, amiodarone, metoprolol tartrate, sodium chloride flush, sodium chloride flush, traZODone   Vital Signs    Vitals:   09/23/20 1936 09/24/20 0012 09/24/20 0405 09/24/20 0734  BP: 120/77 110/75 120/80 120/82  Pulse: 77 89 76 86  Resp: 15 15 15 15    Temp: 98.3 F (36.8 C) 98.2 F (36.8 C) 97.6 F (36.4 C) 97.6 F (36.4 C)  TempSrc: Oral Oral Oral Oral  SpO2: 95% 98% 96% 99%  Weight:   (!) 140.2 kg   Height:        Intake/Output Summary (Last 24 hours) at 09/24/2020 0955 Last data filed at 09/24/2020 0406 Gross per 24 hour  Intake 415.32 ml  Output 1750 ml  Net -1334.68 ml   Filed Weights   09/22/20 0416 09/23/20 0300 09/24/20 0405  Weight: (!) 137 kg (!) 141.2 kg (!) 140.2 kg    Telemetry    SR, No VT on NSVT - Personally Reviewed  ECG    No new - Personally Reviewed  Physical Exam   GEN: No acute distress.   Neck: No JVD Cardiac: RRR, no murmurs, rubs, or gallops.  Respiratory: Clear to auscultation bilaterally. GI: Soft, nontender, non-distended  MS: +1 edema; No deformity, skin is warm, improved from prior Neuro:  Nonfocal  Psych: Normal affect   Labs    Chemistry Recent Labs  Lab 09/18/20 0500 09/19/20 0415 09/22/20 1137 09/23/20 0107 09/24/20 0149  NA 135   < > 136 134* 136  K 4.1   < > 4.3 3.8 3.7  CL 100   < > 97* 96* 97*  CO2 28   < > 28 32 31  GLUCOSE 197*   < > 199* 191* 190*  BUN 21   < > 12 12 9   CREATININE 1.00   < > 0.99 0.85 0.88  CALCIUM 7.8*   < > 7.8* 7.7* 7.7*  GFRNONAA >60   < > >60 >60 >60  GFRAA >60  --   --   --   --   ANIONGAP 7   < > 11 6 8    < > = values in this interval not displayed.     Hematology Recent Labs  Lab 09/19/20 0415 09/19/20 1022 09/23/20 0107 09/24/20 0149  WBC 12.4*  --  10.1 10.0  RBC 3.16* 3.29* 3.09* 3.06*  HGB 10.8*  --  10.3* 10.1*  HCT 32.9*  --  31.1* 31.1*  MCV 104.1*  --  100.6* 101.6*  MCH 34.2*  --  33.3 33.0  MCHC 32.8  --  33.1 32.5  RDW 18.4*  --  17.5* 17.2*  PLT 199  --  279 262    Cardiac EnzymesNo results for input(s): TROPONINI in the last 168 hours. No results for input(s): TROPIPOC in the last 168 hours.   BNPNo results for input(s): BNP, PROBNP in the last 168 hours.   DDimer No results for input(s): DDIMER  in the last 168 hours.   Radiology    ECHOCARDIOGRAM LIMITED  Result Date: 09/23/2020    ECHOCARDIOGRAM LIMITED REPORT   Patient Name:   Gregory [name] Date of Exam: 09/22/2020 Medical Rec #:  [id number]                 Height:       78.0 in Accession #:    [id number]                Weight:       302.0 lb Date of Birth:  [birth year]                 BSA:          2.690 m Patient Age:    58 years                  BP:           102/70 mmHg Patient Gender: M                         HR:           82 bpm. Exam Location:  Inpatient Procedure: Limited Color Doppler, Cardiac Doppler and Limited Echo Indications:    ventricular tachycardia  History:        Patient has prior history of Echocardiogram examinations, most                 recent 09/10/2020. CHF, CAD, Defibrillator, Arrythmias:Atrial                 Fibrillation; Risk Factors:Hypertension.  Sonographer:    Johny Chess Referring Phys: [id number] [place] A Yancy Hascall IMPRESSIONS  1. Left ventricular ejection fraction, by estimation, is 45 to 50%. The left ventricle has mildly decreased function. The left ventricle demonstrates global hypokinesis. Left ventricular diastolic parameters are consistent with Grade I diastolic dysfunction (impaired relaxation).  2. Right ventricular systolic function was not well visualized. The right ventricular size is normal.  3. The mitral valve is grossly normal. No evidence of mitral valve regurgitation.  4. The aortic valve was not well visualized.  5. The inferior vena cava is normal  in size with greater than 50% respiratory variability, suggesting right atrial pressure of 3 mmHg. Comparison(s): Slight decrease in LV function with different caliber (non-contrasted) study. Slight decrease in global LVEF. FINDINGS  Left Ventricle: Left ventricular ejection fraction, by estimation, is 45 to 50%. The left ventricle has mildly decreased function. The left ventricle demonstrates global hypokinesis. Left ventricular  diastolic parameters are consistent with Grade I diastolic dysfunction (impaired relaxation). Right Ventricle: The right ventricular size is normal. Right ventricular systolic function was not well visualized. Pericardium: There is no evidence of pericardial effusion. Mitral Valve: The mitral valve is grossly normal. Tricuspid Valve: The tricuspid valve is not well visualized. Tricuspid valve regurgitation is trivial. Aortic Valve: The aortic valve was not well visualized. Pulmonic Valve: The pulmonic valve was not well visualized. Pulmonic valve regurgitation is not visualized. Aorta: The aortic root and ascending aorta are structurally normal, with no evidence of dilitation. Venous: The inferior vena cava is normal in size with greater than 50% respiratory variability, suggesting right atrial pressure of 3 mmHg. LEFT VENTRICLE PLAX 2D LVIDd:         5.60 cm  Diastology LVIDs:         4.70 cm  LV e' medial:    7.40 cm/s LV PW:         1.10 cm  LV E/e' medial:  7.0 LV IVS:        1.30 cm  LV e' lateral:   11.10 cm/s LVOT diam:     2.30 cm  LV E/e' lateral: 4.7 LV SV:         62 LV SV Index:   23 LVOT Area:     4.15 cm  AORTIC VALVE LVOT Vmax:   81.60 cm/s LVOT Vmean:  62.300 cm/s LVOT VTI:    0.149 m  AORTA Ao Root diam: 4.40 cm Ao Asc diam:  3.60 cm MITRAL VALVE MV Area (PHT): 3.03 cm    SHUNTS MV Decel Time: 250 msec    Systemic VTI:  0.15 m MV E velocity: 51.80 cm/s  Systemic Diam: 2.30 cm MV A velocity: 60.00 cm/s MV E/A ratio:  0.86 Rudean Haskell MD Electronically signed by Rudean Haskell MD Signature Date/Time: 09/23/2020/7:33:51 AM    Final     Cardiac Studies   LCP Pending  Patient Profile     59 y.o. male with sepsis, then a fib, then VT  Assessment & Plan    VT status post ICD and CAD -Continue to maintain good electrolyte balance. -Continuing 400 mg oral load continuing - EP following:  Given PMVT event without evidence of trigger, planned for LCP 09/24/20 - on ASA; may DC  post cath if no new disease  - also on metoprolol succinate  HF recovered EF - hypervolemic-> near euvolemic -reasonable improvement with IV lasix - post RHC will set lasix dosing plan - continue Entresto - Metoprolol Succinate 50 mg - seen by EP recommended continued diltiazem  Persistent atrial fibrillation - CHASDVASC 3 and s/p DCCV -History includes early return of atrial fibrillation after cardioversion on 9/30, given amiodarone load and repeat cardioversion on 09/19/2020 in sinus rhythm now.   - lovenox back to eliquis after LCP -On discharge will do 400 mg amio daily for VT suppression - will need outpatient follow up for TFTs, LFTs, and PFTs   For questions or updates, please contact [place] Please consult [email or web address] for contact info under Cardiology/STEMI.      Signed, Werner Lean, MD  09/24/2020, 9:55 AM

## 2020-09-24 NOTE — Progress Notes (Signed)
Patient refused the use of CPAP for the evening.  

## 2020-09-24 NOTE — Progress Notes (Signed)
[facility] PROGRESS NOTE    Progress Note  Gregory [name]  YIF:[id number] DOB: 08-17-1961 DOA: 09/09/2020 PCP: Patient, No Pcp Per     Brief Narrative:   Gregory [name] is an 59 y.o. male past medical history of coronary artery disease, chronic diastolic heart failure, ischemic cardiomyopathy essential hypertension status post cardiac defibrillation who initially presented to the ED with nausea vomiting and diarrhea was found to be in acute kidney injury requiring vasopressors initially admitted to the ICU infectious work-up turned out to be negative.  He then developed A. 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 EP was consulted and recommended DCCV on 09/19/2020. Patient did develop acute confusion, leukocytosis and was suspected to be in sepsis due to UTI started empirically on antibiotics.  Assessment/Plan:   Ventricular fibrillation status post AICD and CAD/A. fib with RVR CHADS Vasc score greater than 4. Had a burst of fast heart rate, cardiology changes metoprolol started him loading with amiodarone. Cath planned for today 09/24/2020, will continue Eliquis. Social worker is working try to get him approved to inpatient rehab.  Persistent atrial fibrillation with RVR: With a chads Vasc score greater than 4, he is status post 2 cardioversions currently in sinus rhythm. He was loaded with amiodarone. He will have to go home on Eliquis and amiodarone 400 mg p.o. daily.  Acute confusional state/acute encephalopathy: Likely due to infectious etiology CT scan unremarkable unable to do MRI due to AICD. His mental status has returned to baseline he is neurologically intact.  Severe sepsis due to UTI: Started empirically on IV Rocephin on admission is in the cephalopathy resolved. He  completed his course of IV antibiotics in house. Culture data has remained negative till date.  Acute on chronic diastolic heart failure: Appears euvolemic, he is currently on Lasix IV daily. He is diuresing well now about 3 L at half negative. Natremia was likely due to hypovolemia has now resolved.  His creatinine is remained stable.  Acute kidney injury: Likely prerenal in the setting of A. fib with RVR with a baseline less than 1. Resolved with IV fluid hydration.    Thrombocytopenia: After starting heparin head bodies were negative there is no evidence of bleeding after stopping heparin his platelets are improving.  Hypocalcemia: Was giving calcium gluconate now resolved.  Coronary artery disease: Status post AICD, denies any chest pain or shortness of breath.  Macrocytic anemia: Folate of 300, B12 was 683. Check an anemia panel.  Chronic combined systolic and diastolic heart failure (HCC) Appears euvolemic on physical exam KVO IV fluids last echo showed an EF of 55%.  Obstructive sleep apnea: Continue CPAP at night.  Mild transaminitis: Abdominal ultrasound was negative for acute cholecystitis he denies any abdominal pain. Likely cardiovascular mediated now resolved.  Newly diagnosed diabetes mellitus type 2: Check an A1c he was started on long-acting insulin plus sliding scale we will increase long-acting insulin his blood glucose not controlled.  Seizures: Continue Keppra.   DVT prophylaxis: Eliquis Family Communication:none Status is: Inpatient  Remains inpatient appropriate because:Hemodynamically unstable   Dispo: The patient is from: Home              Anticipated d/c is to: CIR              Anticipated d/c date is: 2 days  Patient currently patient is medically stable transferred to CIR.  Awaiting CIR recommendations.        Code Status:     Code Status Orders  (From admission, onward)         Start     Ordered   09/09/20  1642  Full code  Continuous        09/09/20 1643        Code Status History    Date Active Date Inactive Code Status Order ID Comments User Context   06/07/2018 2227 06/09/2018 1730 Full Code [id number]  Jani Gravel, MD Inpatient   12/19/2016 1651 12/19/2016 2113 Full Code [id number]  [name], MD Inpatient   05/04/2016 2100 05/07/2016 2158 Full Code [id number]  Jerline Pain, MD Inpatient   Advance Care Planning Activity        IV Access:    Peripheral IV   Procedures and diagnostic studies:   ECHOCARDIOGRAM LIMITED  Result Date: 09/23/2020    ECHOCARDIOGRAM LIMITED REPORT   Patient Name:   Gregory [name] Date of Exam: 09/22/2020 Medical Rec #:  [id number]                 Height:       78.0 in Accession #:    [id number]                Weight:       302.0 lb Date of Birth:  1960-12-25                 BSA:          2.690 m Patient Age:    71 years                  BP:           102/70 mmHg Patient Gender: M                         HR:           82 bpm. Exam Location:  Inpatient Procedure: Limited Color Doppler, Cardiac Doppler and Limited Echo Indications:    ventricular tachycardia  History:        Patient has prior history of Echocardiogram examinations, most                 recent 09/10/2020. CHF, CAD, Defibrillator, Arrythmias:Atrial                 Fibrillation; Risk Factors:Hypertension.  Sonographer:    Johny Chess Referring Phys: [id number] [place] A CHANDRASEKHAR IMPRESSIONS  1. Left ventricular ejection fraction, by estimation, is 45 to 50%. The left ventricle has mildly decreased function. The left ventricle demonstrates global hypokinesis. Left ventricular diastolic parameters are consistent with Grade I diastolic dysfunction (impaired relaxation).  2. Right ventricular systolic function was not well visualized. The right ventricular size is normal.  3. The mitral valve is grossly normal. No evidence of mitral valve regurgitation.  4. The aortic valve was not well  visualized.  5. The inferior vena cava is normal in size with greater than 50% respiratory variability, suggesting right atrial pressure of 3 mmHg. Comparison(s): Slight decrease in LV function with different caliber (non-contrasted) study. Slight decrease in global LVEF. FINDINGS  Left Ventricle: Left ventricular ejection fraction, by estimation, is 45 to 50%. The left ventricle has mildly decreased function. The left ventricle demonstrates global hypokinesis. Left ventricular diastolic parameters are consistent with Grade  I diastolic dysfunction (impaired relaxation). Right Ventricle: The right ventricular size is normal. Right ventricular systolic function was not well visualized. Pericardium: There is no evidence of pericardial effusion. Mitral Valve: The mitral valve is grossly normal. Tricuspid Valve: The tricuspid valve is not well visualized. Tricuspid valve regurgitation is trivial. Aortic Valve: The aortic valve was not well visualized. Pulmonic Valve: The pulmonic valve was not well visualized. Pulmonic valve regurgitation is not visualized. Aorta: The aortic root and ascending aorta are structurally normal, with no evidence of dilitation. Venous: The inferior vena cava is normal in size with greater than 50% respiratory variability, suggesting right atrial pressure of 3 mmHg. LEFT VENTRICLE PLAX 2D LVIDd:         5.60 cm  Diastology LVIDs:         4.70 cm  LV e' medial:    7.40 cm/s LV PW:         1.10 cm  LV E/e' medial:  7.0 LV IVS:        1.30 cm  LV e' lateral:   11.10 cm/s LVOT diam:     2.30 cm  LV E/e' lateral: 4.7 LV SV:         62 LV SV Index:   23 LVOT Area:     4.15 cm  AORTIC VALVE LVOT Vmax:   81.60 cm/s LVOT Vmean:  62.300 cm/s LVOT VTI:    0.149 m  AORTA Ao Root diam: 4.40 cm Ao Asc diam:  3.60 cm MITRAL VALVE MV Area (PHT): 3.03 cm    SHUNTS MV Decel Time: 250 msec    Systemic VTI:  0.15 m MV E velocity: 51.80 cm/s  Systemic Diam: 2.30 cm MV A velocity: 60.00 cm/s MV E/A ratio:  0.86  Rudean Haskell MD Electronically signed by Rudean Haskell MD Signature Date/Time: 09/23/2020/7:33:51 AM    Final      Medical Consultants:    None.  Anti-Infectives:   keflex  Subjective:    Gregory [name] no complaints this morning, except for being hungry.  Objective:    Vitals:   09/23/20 1936 09/24/20 0012 09/24/20 0405 09/24/20 0734  BP: 120/77 110/75 120/80 120/82  Pulse: 77 89 76 86  Resp: 15 15 15 15   Temp: 98.3 F (36.8 C) 98.2 F (36.8 C) 97.6 F (36.4 C) 97.6 F (36.4 C)  TempSrc: Oral Oral Oral Oral  SpO2: 95% 98% 96% 99%  Weight:   (!) 140.2 kg   Height:       SpO2: 99 % O2 Flow Rate (L/min): 3 L/min   Intake/Output Summary (Last 24 hours) at 09/24/2020 1045 Last data filed at 09/24/2020 0406 Gross per 24 hour  Intake 415.32 ml  Output 1750 ml  Net -1334.68 ml   Filed Weights   09/22/20 0416 09/23/20 0300 09/24/20 0405  Weight: (!) 137 kg (!) 141.2 kg (!) 140.2 kg    Exam: General exam: In no acute distress. Respiratory system: Good air movement and clear to auscultation. Cardiovascular system: S1 & S2 heard, RRR. No JVD. Gastrointestinal system: Abdomen is nondistended, soft and nontender.  Central nervous system: Alert and oriented. No focal neurological deficits. Extremities: No pedal edema. Skin: No rashes, lesions or ulcers Psychiatry: Judgement and insight appear normal. Mood & affect appropriate.  Data Reviewed:    Labs: Basic Metabolic Panel: Recent Labs  Lab 09/18/20 0500 09/19/20 0415 09/21/20 0445 09/21/20 0445 09/22/20 4580 09/22/20 0637 09/22/20 1137 09/22/20 1137 09/23/20 0107 09/24/20 0149  NA 135   < >  136  --  137  --  136  --  134* 136  K 4.1   < > 4.4   < > 4.3   < > 4.3   < > 3.8 3.7  CL 100   < > 99  --  98  --  97*  --  96* 97*  CO2 28   < > 28  --  29  --  28  --  32 31  GLUCOSE 197*   < > 195*  --  148*  --  199*  --  191* 190*  BUN 21   < > 11  --  12  --  12  --  12 9   CREATININE 1.00   < > 0.88  --  0.89  --  0.99  --  0.85 0.88  CALCIUM 7.8*   < > 7.8*  --  7.8*  --  7.8*  --  7.7* 7.7*  MG 2.0  --   --   --   --   --  1.7  --  1.8 1.7   < > = values in this interval not displayed.   GFR Estimated Creatinine Clearance: 138.3 mL/min (by C-G formula based on SCr of 0.88 mg/dL). Liver Function Tests: No results for input(s): AST, ALT, ALKPHOS, BILITOT, PROT, ALBUMIN in the last 168 hours. No results for input(s): LIPASE, AMYLASE in the last 168 hours. No results for input(s): AMMONIA in the last 168 hours. Coagulation profile Recent Labs  Lab 09/19/20 0546  INR 1.5*   COVID-19 Labs  No results for input(s): DDIMER, FERRITIN, LDH, CRP in the last 72 hours.  Lab Results  Component Value Date   SARSCOV2NAA NEGATIVE 09/24/2020   [place] NEGATIVE 09/09/2020   [place] Not Detected 12/05/2019    CBC: Recent Labs  Lab 09/18/20 0500 09/19/20 0415 09/23/20 0107 09/24/20 0149  WBC 14.4* 12.4* 10.1 10.0  NEUTROABS 13.5* 10.6*  --   --   HGB 11.1* 10.8* 10.3* 10.1*  HCT 34.2* 32.9* 31.1* 31.1*  MCV 104.0* 104.1* 100.6* 101.6*  PLT 203 199 279 262   Cardiac Enzymes: No results for input(s): CKTOTAL, CKMB, CKMBINDEX, TROPONINI in the last 168 hours. BNP (last 3 results) No results for input(s): PROBNP in the last 8760 hours. CBG: Recent Labs  Lab 09/23/20 0614 09/23/20 1110 09/23/20 1638 09/23/20 2121 09/24/20 0554  GLUCAP 166* 191* 168* 153* 156*   D-Dimer: No results for input(s): DDIMER in the last 72 hours. Hgb A1c: Recent Labs    09/21/20 1130  HGBA1C 6.9*   Lipid Profile: No results for input(s): CHOL, HDL, LDLCALC, TRIG, CHOLHDL, LDLDIRECT in the last 72 hours. Thyroid function studies: No results for input(s): TSH, T4TOTAL, T3FREE, THYROIDAB in the last 72 hours.  Invalid input(s): FREET3 Anemia work up: No results for input(s): VITAMINB12, FOLATE, FERRITIN, TIBC, IRON, RETICCTPCT in the last 72  hours. Sepsis Labs: Recent Labs  Lab 09/18/20 0500 09/19/20 0415 09/23/20 0107 09/24/20 0149  WBC 14.4* 12.4* 10.1 10.0   Microbiology Recent Results (from the past 240 hour(s))  Culture, blood (Routine X 2) w Reflex to ID Panel     Status: None   Collection Time: 09/16/20 10:10 AM   Specimen: BLOOD RIGHT HAND  Result Value Ref Range Status   Specimen Description BLOOD RIGHT HAND  Final   Special Requests   Final    BOTTLES DRAWN AEROBIC ONLY Blood Culture results may not be optimal due to an inadequate  volume of blood received in culture bottles   Culture   Final    NO GROWTH 5 DAYS Performed at [facility], [place] [address] [place], [place] [address]    Report Status 09/21/2020 FINAL  Final  Culture, blood (Routine X 2) w Reflex to ID Panel     Status: None   Collection Time: 09/16/20 10:10 AM   Specimen: BLOOD RIGHT HAND  Result Value Ref Range Status   Specimen Description BLOOD RIGHT HAND  Final   Special Requests   Final    BOTTLES DRAWN AEROBIC ONLY Blood Culture results may not be optimal due to an inadequate volume of blood received in culture bottles   Culture   Final    NO GROWTH 5 DAYS Performed at [facility], [place] [address], [place], [place] [address]    Report Status 09/21/2020 FINAL  Final  SARS Coronavirus 2 by RT PCR (hospital order, performed in [facility] hospital lab) Nasopharyngeal Nasopharyngeal Swab     Status: None   Collection Time: 09/24/20  1:10 AM   Specimen: Nasopharyngeal Swab  Result Value Ref Range Status   SARS Coronavirus 2 NEGATIVE NEGATIVE Final    Comment: (NOTE) SARS-CoV-2 target nucleic acids are NOT DETECTED.  The SARS-CoV-2 RNA is generally detectable in upper and lower respiratory specimens during the acute phase of infection. The lowest concentration of SARS-CoV-2 viral copies this assay can detect is 250 copies / mL. A negative result does not preclude SARS-CoV-2 infection and should not be used as the sole  basis for treatment or other patient management decisions.  A negative result may occur with improper specimen collection / handling, submission of specimen other than nasopharyngeal swab, presence of viral mutation(s) within the areas targeted by this assay, and inadequate number of viral copies (<250 copies / mL). A negative result must be combined with clinical observations, patient history, and epidemiological information.  Fact Sheet for Patients:   StrictlyIdeas.no  Fact Sheet for Healthcare Providers: BankingDealers.co.za  This test is not yet approved or  cleared by the [place] FDA and has been authorized for detection and/or diagnosis of SARS-CoV-2 by FDA under an Emergency Use Authorization (EUA).  This EUA will remain in effect (meaning this test can be used) for the duration of the COVID-19 declaration under Section 564(b)(1) of the Act, 21 U.S.C. section 360bbb-3(b)(1), unless the authorization is terminated or revoked sooner.  Performed at [facility], [place] [address], [place], [place] [address]      Medications:   . amiodarone  400 mg Oral Q12H   Followed by  . [START ON 10/07/2020] amiodarone  400 mg Oral Daily  . atorvastatin  80 mg Oral QHS  . Chlorhexidine Gluconate Cloth  6 each Topical Daily  . diltiazem  180 mg Oral Daily  . docusate sodium  100 mg Oral BID  . enoxaparin (LOVENOX) injection  1 mg/kg Subcutaneous Q12H  . insulin aspart  0-20 Units Subcutaneous TID WC  . insulin aspart  0-5 Units Subcutaneous QHS  . insulin detemir  5 Units Subcutaneous BID  . levETIRAcetam  500 mg Oral BID  . metoprolol succinate  50 mg Oral Daily  . pantoprazole  40 mg Oral QHS  . polyethylene glycol  17 g Oral Daily  . sacubitril-valsartan  1 tablet Oral BID  . sodium chloride flush  3 mL Intravenous Q12H  . sodium chloride flush  3 mL Intravenous Q12H   Continuous Infusions: .  sodium chloride    .  sodium chloride    . sodium chloride 10 mL/hr at 09/24/20 0511      LOS: 15 days   Charlynne Cousins  [facility]  09/24/2020, 10:45 AM

## 2020-09-24 NOTE — Progress Notes (Signed)
PT Cancellation Note  Patient Details Name: Gregory [name] MRN: [id number] DOB: 09-Jul-1961   Cancelled Treatment:    Reason Eval/Treat Not Completed: Other (comment). Pt declined ambulation due to waiting for heart cath and anxious about being shocked again by ICD.    [place] 09/24/2020, 1:14 PM [place] Pager [phone] Office [phone]

## 2020-09-24 NOTE — Progress Notes (Signed)
Inpatient Rehab Admissions Coordinator:   Note plans for heart cath today.  Will continue to follow.  Need prior approval from worker's comp for CIR.    Shann Medal, PT, DPT Admissions Coordinator [phone] 09/24/20  11:51 AM

## 2020-09-24 NOTE — H&P (View-Only) (Signed)
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 Sabra Heck.  Had new atrial fibrillation.  Initially seen 09/09/20.  DCCV Twice through course.  No events overnight.  Transitioned to PO Amiodarone.  Patient is nervous but optimistic about getting better.  No palpitations or SOB.  Inpatient Medications    Scheduled Meds: . amiodarone  400 mg Oral Q12H   Followed by  . [START ON 10/07/2020] amiodarone  400 mg Oral Daily  . atorvastatin  80 mg Oral QHS  . Chlorhexidine Gluconate Cloth  6 each Topical Daily  . diltiazem  180 mg Oral Daily  . docusate sodium  100 mg Oral BID  . enoxaparin (LOVENOX) injection  1 mg/kg Subcutaneous Q12H  . insulin aspart  0-20 Units Subcutaneous TID WC  . insulin aspart  0-5 Units Subcutaneous QHS  . insulin detemir  5 Units Subcutaneous BID  . levETIRAcetam  500 mg Oral BID  . metoprolol succinate  50 mg Oral Daily  . pantoprazole  40 mg Oral QHS  . polyethylene glycol  17 g Oral Daily  . sacubitril-valsartan  1 tablet Oral BID  . sodium chloride flush  3 mL Intravenous Q12H  . sodium chloride flush  3 mL Intravenous Q12H   Continuous Infusions: . sodium chloride    . sodium chloride    . sodium chloride 10 mL/hr at 09/24/20 0511  . sodium chloride    . amiodarone Stopped (09/24/20 0000)   PRN Meds: sodium chloride, sodium chloride, acetaminophen, amiodarone, metoprolol tartrate, sodium chloride flush, sodium chloride flush, traZODone   Vital Signs    Vitals:   09/23/20 1936 09/24/20 0012 09/24/20 0405 09/24/20 0734  BP: 120/77 110/75 120/80 120/82  Pulse: 77 89 76 86  Resp: 15 15 15 15    Temp: 98.3 F (36.8 C) 98.2 F (36.8 C) 97.6 F (36.4 C) 97.6 F (36.4 C)  TempSrc: Oral Oral Oral Oral  SpO2: 95% 98% 96% 99%  Weight:   (!) 140.2 kg   Height:        Intake/Output Summary (Last 24 hours) at 09/24/2020 0955 Last data filed at 09/24/2020 0406 Gross per 24 hour  Intake 415.32 ml  Output 1750 ml  Net -1334.68 ml   Filed Weights   09/22/20 0416 09/23/20 0300 09/24/20 0405  Weight: (!) 137 kg (!) 141.2 kg (!) 140.2 kg    Telemetry    SR, No VT on NSVT - Personally Reviewed  ECG    No new - Personally Reviewed  Physical Exam   GEN: No acute distress.   Neck: No JVD Cardiac: RRR, no murmurs, rubs, or gallops.  Respiratory: Clear to auscultation bilaterally. GI: Soft, nontender, non-distended  MS: +1 edema; No deformity, skin is warm, improved from prior Neuro:  Nonfocal  Psych: Normal affect   Labs    Chemistry Recent Labs  Lab 09/18/20 0500 09/19/20 0415 09/22/20 1137 09/23/20 0107 09/24/20 0149  NA 135   < > 136 134* 136  K 4.1   < > 4.3 3.8 3.7  CL 100   < > 97* 96* 97*  CO2 28   < > 28 32 31  GLUCOSE 197*   < > 199* 191* 190*  BUN 21   < > 12 12 9   CREATININE 1.00   < > 0.99 0.85 0.88  CALCIUM 7.8*   < > 7.8* 7.7* 7.7*  GFRNONAA >60   < > >60 >60 >60  GFRAA >60  --   --   --   --   ANIONGAP 7   < > 11 6 8    < > = values in this interval not displayed.     Hematology Recent Labs  Lab 09/19/20 0415 09/19/20 1022 09/23/20 0107 09/24/20 0149  WBC 12.4*  --  10.1 10.0  RBC 3.16* 3.29* 3.09* 3.06*  HGB 10.8*  --  10.3* 10.1*  HCT 32.9*  --  31.1* 31.1*  MCV 104.1*  --  100.6* 101.6*  MCH 34.2*  --  33.3 33.0  MCHC 32.8  --  33.1 32.5  RDW 18.4*  --  17.5* 17.2*  PLT 199  --  279 262    Cardiac EnzymesNo results for input(s): TROPONINI in the last 168 hours. No results for input(s): TROPIPOC in the last 168 hours.   BNPNo results for input(s): BNP, PROBNP in the last 168 hours.   DDimer No results for input(s): DDIMER  in the last 168 hours.   Radiology    ECHOCARDIOGRAM LIMITED  Result Date: 09/23/2020    ECHOCARDIOGRAM LIMITED REPORT   Patient Name:   Gregory [name] Date of Exam: 09/22/2020 Medical Rec #:  [id number]                 Height:       78.0 in Accession #:    [id number]                Weight:       302.0 lb Date of Birth:  September 12, 1961                 BSA:          2.690 m Patient Age:    55 years                  BP:           102/70 mmHg Patient Gender: M                         HR:           82 bpm. Exam Location:  Inpatient Procedure: Limited Color Doppler, Cardiac Doppler and Limited Echo Indications:    ventricular tachycardia  History:        Patient has prior history of Echocardiogram examinations, most                 recent 09/10/2020. CHF, CAD, Defibrillator, Arrythmias:Atrial                 Fibrillation; Risk Factors:Hypertension.  Sonographer:    Johny Chess Referring Phys: [id number] [name] IMPRESSIONS  1. Left ventricular ejection fraction, by estimation, is 45 to 50%. The left ventricle has mildly decreased function. The left ventricle demonstrates global hypokinesis. Left ventricular diastolic parameters are consistent with Grade I diastolic dysfunction (impaired relaxation).  2. Right ventricular systolic function was not well visualized. The right ventricular size is normal.  3. The mitral valve is grossly normal. No evidence of mitral valve regurgitation.  4. The aortic valve was not well visualized.  5. The inferior vena cava is normal  in size with greater than 50% respiratory variability, suggesting right atrial pressure of 3 mmHg. Comparison(s): Slight decrease in LV function with different caliber (non-contrasted) study. Slight decrease in global LVEF. FINDINGS  Left Ventricle: Left ventricular ejection fraction, by estimation, is 45 to 50%. The left ventricle has mildly decreased function. The left ventricle demonstrates global hypokinesis. Left ventricular  diastolic parameters are consistent with Grade I diastolic dysfunction (impaired relaxation). Right Ventricle: The right ventricular size is normal. Right ventricular systolic function was not well visualized. Pericardium: There is no evidence of pericardial effusion. Mitral Valve: The mitral valve is grossly normal. Tricuspid Valve: The tricuspid valve is not well visualized. Tricuspid valve regurgitation is trivial. Aortic Valve: The aortic valve was not well visualized. Pulmonic Valve: The pulmonic valve was not well visualized. Pulmonic valve regurgitation is not visualized. Aorta: The aortic root and ascending aorta are structurally normal, with no evidence of dilitation. Venous: The inferior vena cava is normal in size with greater than 50% respiratory variability, suggesting right atrial pressure of 3 mmHg. LEFT VENTRICLE PLAX 2D LVIDd:         5.60 cm  Diastology LVIDs:         4.70 cm  LV e' medial:    7.40 cm/s LV PW:         1.10 cm  LV E/e' medial:  7.0 LV IVS:        1.30 cm  LV e' lateral:   11.10 cm/s LVOT diam:     2.30 cm  LV E/e' lateral: 4.7 LV SV:         62 LV SV Index:   23 LVOT Area:     4.15 cm  AORTIC VALVE LVOT Vmax:   81.60 cm/s LVOT Vmean:  62.300 cm/s LVOT VTI:    0.149 m  AORTA Ao Root diam: 4.40 cm Ao Asc diam:  3.60 cm MITRAL VALVE MV Area (PHT): 3.03 cm    SHUNTS MV Decel Time: 250 msec    Systemic VTI:  0.15 m MV E velocity: 51.80 cm/s  Systemic Diam: 2.30 cm MV A velocity: 60.00 cm/s MV E/A ratio:  0.86 Rudean Haskell MD Electronically signed by Rudean Haskell MD Signature Date/Time: 09/23/2020/7:33:51 AM    Final     Cardiac Studies   LCP Pending  Patient Profile     59 y.o. male with sepsis, then a fib, then VT  Assessment & Plan    VT status post ICD and CAD -Continue to maintain good electrolyte balance. -Continuing 400 mg oral load continuing - EP following:  Given PMVT event without evidence of trigger, planned for LCP 09/24/20 - on ASA; may DC  post cath if no new disease  - also on metoprolol succinate  HF recovered EF - hypervolemic-> near euvolemic -reasonable improvement with IV lasix - post RHC will set lasix dosing plan - continue Entresto - Metoprolol Succinate 50 mg - seen by EP recommended continued diltiazem  Persistent atrial fibrillation - CHASDVASC 3 and s/p DCCV -History includes early return of atrial fibrillation after cardioversion on 9/30, given amiodarone load and repeat cardioversion on 09/19/2020 in sinus rhythm now.   - lovenox back to eliquis after LCP -On discharge will do 400 mg amio daily for VT suppression - will need outpatient follow up for TFTs, LFTs, and PFTs   For questions or updates, please contact Marie Please consult [email or web address] for contact info under Cardiology/STEMI.      Signed, Werner Lean, MD  09/24/2020, 9:55 AM

## 2020-09-24 NOTE — Progress Notes (Signed)
Inpatient Rehabilitation Admissions Coordinator  We await insurance authorization for a possible CIR admit. No bed nor approval received today.  Danne Baxter, RN, MSN Rehab Admissions Coordinator [phone] 09/24/2020 3:21 PM

## 2020-09-25 ENCOUNTER — Encounter (HOSPITAL_COMMUNITY): Payer: Self-pay | Admitting: Cardiovascular Disease

## 2020-09-25 LAB — CBC
HCT: 30.6 % — ABNORMAL LOW (ref 39.0–52.0)
Hemoglobin: 9.9 g/dL — ABNORMAL LOW (ref 13.0–17.0)
MCH: 32.8 pg (ref 26.0–34.0)
MCHC: 32.4 g/dL (ref 30.0–36.0)
MCV: 101.3 fL — ABNORMAL HIGH (ref 80.0–100.0)
Platelets: 251 10*3/uL (ref 150–400)
RBC: 3.02 MIL/uL — ABNORMAL LOW (ref 4.22–5.81)
RDW: 17 % — ABNORMAL HIGH (ref 11.5–15.5)
WBC: 8.3 10*3/uL (ref 4.0–10.5)
nRBC: 0 % (ref 0.0–0.2)

## 2020-09-25 LAB — BASIC METABOLIC PANEL
Anion gap: 6 (ref 5–15)
BUN: 9 mg/dL (ref 8–23)
CO2: 30 mmol/L (ref 22–32)
Calcium: 7.8 mg/dL — ABNORMAL LOW (ref 8.9–10.3)
Chloride: 101 mmol/L (ref 98–111)
Creatinine, Ser: 0.86 mg/dL (ref 0.61–1.24)
GFR, Estimated: >60 mL/min (ref >60)
Glucose, Bld: 159 mg/dL — ABNORMAL HIGH (ref 70–99)
Potassium: 4.5 mmol/L (ref 3.5–5.1)
Sodium: 137 mmol/L (ref 135–145)

## 2020-09-25 LAB — GLUCOSE, CAPILLARY
Glucose-Capillary: 124 mg/dL — ABNORMAL HIGH (ref 70–99)
Glucose-Capillary: 162 mg/dL — ABNORMAL HIGH (ref 70–99)
Glucose-Capillary: 114 mg/dL — ABNORMAL HIGH (ref 70–99)

## 2020-09-25 LAB — MAGNESIUM: Magnesium: 2 mg/dL (ref 1.7–2.4)

## 2020-09-25 MED ORDER — TRAMADOL HCL 50 MG PO TABS
100.0000 mg | ORAL_TABLET | Freq: Four times a day (QID) | ORAL | Status: DC | PRN
  Administered 2020-09-25 – 2020-09-26 (×2): 100 mg via ORAL
  Filled 2020-09-25 (×2): qty 2

## 2020-09-25 MED ORDER — HYDROCORTISONE 1 % EX CREA
1.0000 "application " | TOPICAL_CREAM | Freq: Three times a day (TID) | CUTANEOUS | Status: DC | PRN
  Administered 2020-09-25 – 2020-09-29 (×9): 1 via TOPICAL
  Filled 2020-09-25 (×5): qty 28

## 2020-09-25 NOTE — Progress Notes (Signed)
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 Sabra Heck. Had new atrial fibrillation. Initially seen 09/09/20. DCCV Twice through course. LCP 10/11 notable for PCWP of 7, with patient Cors.  Patient notes feeling   Inpatient Medications    Scheduled Meds: . amiodarone  400 mg Oral Q12H   Followed by  . [START ON 10/07/2020] amiodarone  400 mg Oral Daily  . apixaban  5 mg Oral BID  . aspirin  81 mg Oral Daily  . atorvastatin  80 mg Oral QHS  . carvedilol  80 mg Oral Daily  . Chlorhexidine Gluconate Cloth  6 each Topical Daily  . clopidogrel  75 mg Oral Q breakfast  . docusate sodium  100 mg Oral BID  . furosemide  40 mg Oral Daily  . insulin aspart  0-20 Units Subcutaneous TID WC  . insulin aspart  0-5 Units Subcutaneous QHS  . insulin detemir  5 Units Subcutaneous BID  . levETIRAcetam  500 mg Oral BID  . metoprolol succinate  25 mg Oral Daily  . pantoprazole  40 mg Oral Daily  . polyethylene glycol  17 g Oral Daily  . potassium chloride  10 mEq Oral Daily  . sacubitril-valsartan  1 tablet Oral BID  . sodium chloride flush  3 mL Intravenous Q12H   Continuous Infusions: . sodium chloride     PRN Meds: sodium chloride, acetaminophen, acetaminophen, metoprolol tartrate, morphine injection, ondansetron (ZOFRAN) IV, sodium chloride flush, traZODone   Vital Signs    Vitals:   09/25/20 0348 09/25/20 0349 09/25/20 0644 09/25/20 0709  BP: 100/70   105/87  Pulse: 67 65 70 70  Resp: 13 17 13 18   Temp: 97.6 F (36.4 C)   97.7 F (36.5 C)  TempSrc: Oral   Oral  SpO2: 96% 97% 97% 96%  Weight:   (!) 137.4 kg   Height:         Intake/Output Summary (Last 24 hours) at 09/25/2020 0933 Last data filed at 09/25/2020 3329 Gross per 24 hour  Intake 1078.17 ml  Output 1550 ml  Net -471.83 ml   Filed Weights   09/23/20 0300 09/24/20 0405 09/25/20 0644  Weight: (!) 141.2 kg (!) 140.2 kg (!) 137.4 kg    Telemetry    1952 one episodes of NSVT - Personally Reviewed  ECG    No new - Personally Reviewed  Physical Exam   GEN: No acute distress.   Neck: No JVD Cardiac: RRR, no murmurs, rubs, or gallops.  Respiratory: Clear to auscultation bilaterally. GI: Soft, nontender, non-distended  MS:+1 edema; No deformity. Neuro:  Nonfocal  Psych: Normal affect   Labs    Chemistry Recent Labs  Lab 09/23/20 0107 09/23/20 0107 09/24/20 0149 09/24/20 1501 09/24/20 1502 09/24/20 1509 09/25/20 0151  NA 134*   < > 136   < > 138 139 137  K 3.8   < > 3.7   < > 4.2 4.0 4.5  CL 96*  --  97*  --   --   --  101  CO2 32  --  31  --   --   --  30  GLUCOSE 191*  --  190*  --   --   --  159*  BUN 12  --  9  --   --   --  9  CREATININE 0.85  --  0.88  --   --   --  0.86  CALCIUM 7.7*  --  7.7*  --   --   --  7.8*  GFRNONAA >60  --  >60  --   --   --  >60  ANIONGAP 6  --  8  --   --   --  6   < > = values in this interval not displayed.     Hematology Recent Labs  Lab 09/23/20 0107 09/23/20 0107 09/24/20 0149 09/24/20 1501 09/24/20 1502 09/24/20 1509 09/25/20 0151  WBC 10.1  --  10.0  --   --   --  8.3  RBC 3.09*  --  3.06*  --   --   --  3.02*  HGB 10.3*   < > 10.1*   < > 11.6* 10.5* 9.9*  HCT 31.1*   < > 31.1*   < > 34.0* 31.0* 30.6*  MCV 100.6*  --  101.6*  --   --   --  101.3*  MCH 33.3  --  33.0  --   --   --  32.8  MCHC 33.1  --  32.5  --   --   --  32.4  RDW 17.5*  --  17.2*  --   --   --  17.0*  PLT 279  --  262  --   --   --  251   < > = values in this interval not displayed.    Cardiac EnzymesNo results for input(s): TROPONINI in the last 168 hours. No results for input(s): TROPIPOC in the  last 168 hours.   BNPNo results for input(s): BNP, PROBNP in the last 168 hours.   DDimer No results for input(s): DDIMER in the last 168 hours.   Radiology    CARDIAC CATHETERIZATION  Result Date: 09/24/2020  Ost RCA to Mid RCA lesion is 100% stenosed.  Previously placed 1st Mrg stent (unknown type) is widely patent.  Prox Cx lesion is 100% stenosed.  Previously placed 1st Diag stent (unknown type) is widely patent.  Previously placed Prox LAD to Mid LAD stent (unknown type) is widely patent.  [name] is a 59 y.o. male  [id number] LOCATION:  FACILITY: [facility] PHYSICIAN: [name], M.D. 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 Sabra Heck. Had new atrial fibrillation. Initially seen 09/09/20. DCCV Twice through course.  Because of recurrent need for defibrillation for ventricular tachycardia it was elected to proceed with right left heart cath to rule out an ischemic etiology.  His cardiac enzymes were negative.   [name] stents appear to be widely patent.  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  I suspect future caths should occur via the femoral approach given difficulty in cannulating the left main.  His right heart pressures suggest that he is euvolemic.  A Mynx closure device was used to create hemostasis in the right common femoral  venous access site.  The sheath was removed and a radial band was placed on the right wrist to achieve hemostasis.  The patient left lab in stable condition. [name]. MD, [facility] 09/24/2020 3:34 PM    Cardiac Studies   IMPRESSIONS   1. Left ventricular ejection fraction, by  estimation, is 40 to 45%. The  left ventricle has mildly decreased function. The left ventricle  demonstrates regional wall motion abnormalities (see scoring  diagram/findings for description). Left ventricular  diastolic parameters are consistent with Grade I diastolic dysfunction  (impaired relaxation).  2. Right ventricular systolic function was not well visualized. The right  ventricular size is normal.  3. The mitral valve is grossly normal. No evidence of mitral valve  regurgitation.  4. The aortic valve was not well visualized.  5. The inferior vena cava is normal in size with greater than 50%  respiratory variability, suggesting right atrial pressure of 3 mmHg.   Comparison(s): Slight decrease in LV function with different caliber  (non-contrasted) study. Slight decrease in global LVEF.   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 -Continuing 400 mg oral load continuing to 10/04/19  - EP Following appreciate recs given repeat episode - DC of ASA, only on DOAC and plavix - now on Coreg 80 mg CR (home dose) - on statin  HFmrEF - euvolemic -Returned to 40 mg lasix; decompensation likely in the setting of Sepsis and IVF - continue Entresto -Coreg as above - reasonable for cardiac rehab - attempt to wean 1 L O2 (day time from night; patient refused BIPAP)  Persistent atrial fibrillation - CHASDVASC 3 and s/p DCCV -History includes early return of atrial fibrillation after cardioversion on 9/30, given amiodarone load and repeat cardioversion on 09/19/2020 in sinus rhythm now. - Back to Eliquis -Amio as above, Coreg as above - will need outpatient follow up for TFTs, LFTs, and PFTs   For questions or updates, please contact [facility] Please consult [email or web address] for contact info under Cardiology/STEMI.       Signed, Werner Lean, MD  09/25/2020, 9:33 AM

## 2020-09-25 NOTE — Progress Notes (Signed)
Pt states he wants to change his CPAP mask tonight and will need help being placed on it between 2200-2300.

## 2020-09-25 NOTE — Progress Notes (Signed)
Physical Therapy Treatment Patient Details Name: Conall Vangorder MRN: [id number] DOB: Feb 01, 1961 Today's Date: 09/25/2020    History of Present Illness Beverley Himmat Enberg is an 59 y.o. male past medical history of coronary artery disease, chronic diastolic heart failure, ischemic cardiomyopathy essential hypertension status post cardiac defibrillation who initially presented to the ED with nausea vomiting and diarrhea was found to be in acute kidney injury requiring vasopressors initially admitted to the ICU infectious work-up turned out to be negative.  He then developed A. fib with RVR cardiology was consulted.Procedure  DC Cardioversion 10/6.    PT Comments    Pt supine in bed.  He required max cues for encouragement as he is extremely anxious about his HR and rhythm.  He performed transfer training x 2 but required bed to be elevated this session to achieve standing with mod assistance.  Pt limited to short gt distance.  Continue to recommend aggressive rehab in a post acute setting.     Follow Up Recommendations  CIR     Equipment Recommendations  Rolling walker with 5" wheels;3in1 (PT)    Recommendations for Other Services       Precautions / Restrictions Precautions Precautions: Fall Restrictions Weight Bearing Restrictions: No    Mobility  Bed Mobility Overal bed mobility: Needs Assistance Bed Mobility: Supine to Sit;Sit to Supine     Supine to sit: Supervision Sit to supine: Supervision   General bed mobility comments: No assistance for LE management this session.  Transfers Overall transfer level: Needs assistance Equipment used: Rolling walker (2 wheeled) Transfers: Sit to/from Stand Sit to Stand: Mod assist         General transfer comment: Pt continues to be cued on hand placement.  required moderate assistance and high elevation of bed to achieve standing.  Pt pulling on device and required assistance to keep RW  grounded.  Ambulation/Gait Ambulation/Gait assistance: Min assist Gait Distance (Feet): 32 Feet Assistive device: Rolling walker (2 wheeled) Gait Pattern/deviations: Step-through pattern;Trunk flexed;Shuffle Gait velocity: reduced   General Gait Details: Pt noted with L knee buckling in stance phase when backing to seated surface.  Pt required cues for upper trunk control and safety with RW.  He is very fearful to advance distance due to fear of HR.  HR 72-99 bpm during session.   Stairs             Wheelchair Mobility    Modified Rankin (Stroke Patients Only)       Balance Overall balance assessment: Needs assistance Sitting-balance support: No upper extremity supported;Feet supported Sitting balance-Leahy Scale: Good       Standing balance-Leahy Scale: Fair                              Cognition Arousal/Alertness: Awake/alert Behavior During Therapy: Anxious Overall Cognitive Status: Within Functional Limits for tasks assessed                                 General Comments: Pt very fear of  heart rate and being "shocked" back into a stable rhythm.      Exercises      General Comments        Pertinent Vitals/Pain Pain Assessment: 0-10 Faces Pain Scale: Hurts a little bit Pain Location: back Pain Descriptors / Indicators: Aching Pain Intervention(s): Monitored during session;Repositioned    Home Living  Prior Function            PT Goals (current goals can now be found in the care plan section) Acute Rehab PT Goals Patient Stated Goal: "I want to go to rehab tomorrow." Potential to Achieve Goals: Good Progress towards PT goals: Progressing toward goals    Frequency    Min 3X/week      PT Plan Current plan remains appropriate    Co-evaluation              AM-PAC PT "6 Clicks" Mobility   Outcome Measure  Help needed turning from your back to your side while in a flat bed  without using bedrails?: A Little Help needed moving from lying on your back to sitting on the side of a flat bed without using bedrails?: A Little Help needed moving to and from a bed to a chair (including a wheelchair)?: A Little Help needed standing up from a chair using your arms (e.g., wheelchair or bedside chair)?: A Lot Help needed to walk in hospital room?: A Lot Help needed climbing 3-5 steps with a railing? : Total 6 Click Score: 14    End of Session Equipment Utilized During Treatment: Gait belt Activity Tolerance: Patient limited by fatigue;Treatment limited secondary to medical complications (Comment) Patient left: in bed;with call bell/phone within reach;with bed alarm set Nurse Communication: Mobility status PT Visit Diagnosis: Other abnormalities of gait and mobility (R26.89);Muscle weakness (generalized) (M62.81)     Time: 9030-0923 PT Time Calculation (min) (ACUTE ONLY): 14 min  Charges:  $Gait Training: 8-22 mins                     Erasmo Leventhal , PTA Acute Rehabilitation Services Pager [phone] Office [phone]     Serrena Linderman Eli Hose 09/25/2020, 4:39 PM

## 2020-09-25 NOTE — Progress Notes (Signed)
Mobility Specialist - Progress Note   09/25/20 1542  Mobility  Activity Refused mobility   Informed by pt's RN that he is refusing mobility at this time.   Pricilla Handler Mobility Specialist Mobility Specialist Phone: [phone]

## 2020-09-25 NOTE — Progress Notes (Signed)
Patient on bedpan and had 14 beats of v-tach. Patient stated he felt his heart beat fast and was a little S.O.B. then back to S.R. with no complaints. Cont. To monitor patient and rhythm.

## 2020-09-25 NOTE — Progress Notes (Signed)
[facility] PROGRESS NOTE    Progress Note  Gregory [name]  FUX:[id number] DOB: May 13, 1961 DOA: 09/09/2020 PCP: Patient, No Pcp Per     Brief Narrative:   Gregory [name] is an 59 y.o. male past medical history of coronary artery disease, chronic diastolic heart failure, ischemic cardiomyopathy essential hypertension status post cardiac defibrillation who initially presented to the ED with nausea vomiting and diarrhea was found to be in acute kidney injury requiring vasopressors initially admitted to the ICU infectious work-up turned out to be negative.  He then developed A. 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 EP was consulted and recommended DCCV on 09/19/2020. Patient did develop acute confusion, leukocytosis and was suspected to be in sepsis due to UTI started empirically on antibiotics.  Assessment/Plan:   Ventricular fibrillation status post AICD and CAD/A. fib with RVR CHADS Vasc score greater than 4. Had a burst of fast heart rate, continue amiodarone 400 loading dose until 10/03/2020. Cath on 09/24/2020 no new obstructive disease, will continue Eliquis. Social worker is working try to get him approved to inpatient rehab. Continue aspirin and Eliquis. Keep potassium greater than 4 magnesium greater than 2. After burst of V. tach no symptomatic he relates he almost passed out.  Persistent atrial fibrillation with RVR: With a chads Vasc score greater than 4, he is status post 2 cardioversions currently in sinus rhythm. To new amiodarone load on 11/23/2020 He will have to go home on Eliquis and amiodarone 400 mg p.o. daily.  Acute confusional state/acute encephalopathy: Likely due to infectious etiology CT scan unremarkable unable to do MRI due to AICD. His mental status  has returned to baseline he is neurologically intact.  Severe sepsis due to UTI: Started empirically on IV Rocephin on admission is in the cephalopathy resolved. He completed his course of IV antibiotics in house. Culture data has remained negative till date.  Acute on chronic diastolic heart failure: Appears euvolemic, change to oral Lasix. He is diuresing well now about 3 L at half negative. Continue Entresto and Coreg.  Acute kidney injury: Likely prerenal in the setting of A. fib with RVR with a baseline less than 1. Resolved with IV fluid hydration.    Thrombocytopenia: After starting heparin head bodies were negative there is no evidence of bleeding after stopping heparin his platelets are improving.  Hypocalcemia: Was giving calcium gluconate now resolved.  Coronary artery disease: Status post AICD, denies any chest pain or shortness of breath.  Macrocytic anemia: Folate of 300, B12 was 683. Check an anemia panel.  Chronic combined systolic and diastolic heart failure (HCC) Appears euvolemic on physical exam KVO IV fluids last echo showed an EF of 55%.  Obstructive sleep apnea: Continue CPAP at night.  Mild transaminitis: Abdominal ultrasound was negative for acute cholecystitis he denies any abdominal pain. Likely cardiovascular mediated now resolved.  Newly diagnosed diabetes mellitus type 2: Check an A1c he was started on long-acting insulin plus sliding scale we will increase long-acting insulin his blood glucose not controlled.  Seizures: Continue Keppra.   DVT prophylaxis: Eliquis Family Communication:none Status is: Inpatient  Remains inpatient appropriate because:Hemodynamically unstable   Dispo: The patient is from: Home              Anticipated d/c is to: CIR  Anticipated d/c date is: 2 days              Patient currently patient is medically stable transferred to CIR.  Awaiting CIR recommendations.        Code Status:      Code Status Orders  (From admission, onward)         Start     Ordered   09/09/20 1642  Full code  Continuous        09/09/20 1643        Code Status History    Date Active Date Inactive Code Status Order ID Comments User Context   06/07/2018 2227 06/09/2018 1730 Full Code [id number]  Jani Gravel, MD Inpatient   12/19/2016 1651 12/19/2016 2113 Full Code [id number]  [name], MD Inpatient   05/04/2016 2100 05/07/2016 2158 Full Code [id number]  Jerline Pain, MD Inpatient   Advance Care Planning Activity        IV Access:    Peripheral IV   Procedures and diagnostic studies:   CARDIAC CATHETERIZATION  Result Date: 09/24/2020  Ost RCA to Mid RCA lesion is 100% stenosed.  Previously placed 1st Mrg stent (unknown type) is widely patent.  Prox Cx lesion is 100% stenosed.  Previously placed 1st Diag stent (unknown type) is widely patent.  Previously placed Prox LAD to Mid LAD stent (unknown type) is widely patent.  Gregory [name] is a 59 y.o. male  [id number] LOCATION:  FACILITY: [facility] PHYSICIAN: [name], M.D. Jan 04, 1961 DATE OF PROCEDURE:  09/24/2020 DATE OF DISCHARGE: CARDIAC CATHETERIZATION History obtained from chart review.Gregory Washington [name] a 59 y.o.malewith a hx of CAD (known chronic occl of RCA with prior PCIs to LAD, diag, and LCx)), CHF(HFmrEF with evaluation as low as 45%, but recently 60-65%)and VT s/p ICDwho is being seen after shockat the request of Dr. Sabra Heck. Had new atrial fibrillation. Initially seen 09/09/20. DCCV Twice through course.  Because of recurrent need for defibrillation for ventricular tachycardia it was elected to proceed with right left heart cath to rule out an ischemic etiology.  His cardiac enzymes were negative.   Mr. Rabine stents appear to be widely patent.  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  I suspect future caths should occur via the femoral approach given difficulty in  cannulating the left main.  His right heart pressures suggest that he is euvolemic.  A Mynx closure device was used to create hemostasis in the right common femoral venous access site.  The sheath was removed and a radial band was placed on the right wrist to achieve hemostasis.  The patient left lab in stable condition. [name]. MD, [facility] 09/24/2020 3:34 PM     Medical Consultants:    None.  Anti-Infectives:   keflex  Subjective:    Fabian Washington Pound relate an episode of lightheadedness  Objective:    Vitals:   09/25/20 0348 09/25/20 0349 09/25/20 0644 09/25/20 0709  BP: 100/70   105/87  Pulse: 67 65 70 70  Resp: 13 17 13 18   Temp: 97.6 F (36.4 C)   97.7 F (36.5 C)  TempSrc: Oral   Oral  SpO2: 96% 97% 97% 96%  Weight:   (!) 137.4 kg   Height:       SpO2: 96 % O2 Flow Rate (L/min): 2 L/min   Intake/Output Summary (Last 24 hours) at 09/25/2020 1127 Last data filed at 09/25/2020 0833 Gross per 24 hour  Intake 1078.17  ml  Output 1550 ml  Net -471.83 ml   Filed Weights   09/23/20 0300 09/24/20 0405 09/25/20 0644  Weight: (!) 141.2 kg (!) 140.2 kg (!) 137.4 kg    Exam: General exam: In no acute distress. Respiratory system: Good air movement and clear to auscultation. Cardiovascular system: S1 & S2 heard, RRR. No JVD. Gastrointestinal system: Abdomen is nondistended, soft and nontender.  Extremities: No pedal edema. Skin: No rashes, lesions or ulcers  Data Reviewed:    Labs: Basic Metabolic Panel: Recent Labs  Lab 09/22/20 0637 09/22/20 0637 09/22/20 1137 09/22/20 1137 09/23/20 0107 09/23/20 0107 09/24/20 0149 09/24/20 0149 09/24/20 1501 09/24/20 1501 09/24/20 1502 09/24/20 1502 09/24/20 1509 09/25/20 0151  NA 137   < > 136   < > 134*   < > 136  --  139  --  138  --  139 137  K 4.3   < > 4.3   < > 3.8   < > 3.7   < > 4.1   < > 4.2   < > 4.0 4.5  CL 98  --  97*  --  96*  --  97*  --   --   --   --   --   --  101  CO2 29  --   28  --  32  --  31  --   --   --   --   --   --  30  GLUCOSE 148*  --  199*  --  191*  --  190*  --   --   --   --   --   --  159*  BUN 12  --  12  --  12  --  9  --   --   --   --   --   --  9  CREATININE 0.89  --  0.99  --  0.85  --  0.88  --   --   --   --   --   --  0.86  CALCIUM 7.8*  --  7.8*  --  7.7*  --  7.7*  --   --   --   --   --   --  7.8*  MG  --   --  1.7  --  1.8  --  1.7  --   --   --   --   --   --  2.0   < > = values in this interval not displayed.   GFR Estimated Creatinine Clearance: 140.1 mL/min (by C-G formula based on SCr of 0.86 mg/dL). Liver Function Tests: No results for input(s): AST, ALT, ALKPHOS, BILITOT, PROT, ALBUMIN in the last 168 hours. No results for input(s): LIPASE, AMYLASE in the last 168 hours. No results for input(s): AMMONIA in the last 168 hours. Coagulation profile Recent Labs  Lab 09/19/20 0546  INR 1.5*   COVID-19 Labs  No results for input(s): DDIMER, FERRITIN, LDH, CRP in the last 72 hours.  Lab Results  Component Value Date   SARSCOV2NAA NEGATIVE 09/24/2020   [place] NEGATIVE 09/09/2020   [place] Not Detected 12/05/2019    CBC: Recent Labs  Lab 09/19/20 0415 09/19/20 0415 09/23/20 0107 09/23/20 0107 09/24/20 0149 09/24/20 1501 09/24/20 1502 09/24/20 1509 09/25/20 0151  WBC 12.4*  --  10.1  --  10.0  --   --   --  8.3  NEUTROABS 10.6*  --   --   --   --   --   --   --   --  HGB 10.8*   < > 10.3*   < > 10.1* 10.9* 11.6* 10.5* 9.9*  HCT 32.9*   < > 31.1*   < > 31.1* 32.0* 34.0* 31.0* 30.6*  MCV 104.1*  --  100.6*  --  101.6*  --   --   --  101.3*  PLT 199  --  279  --  262  --   --   --  251   < > = values in this interval not displayed.   Cardiac Enzymes: No results for input(s): CKTOTAL, CKMB, CKMBINDEX, TROPONINI in the last 168 hours. BNP (last 3 results) No results for input(s): PROBNP in the last 8760 hours. CBG: Recent Labs  Lab 09/24/20 0554 09/24/20 1200 09/24/20 1726 09/24/20 2207  09/25/20 0636  GLUCAP 156* 110* 100* 236* 124*   D-Dimer: No results for input(s): DDIMER in the last 72 hours. Hgb A1c: No results for input(s): HGBA1C in the last 72 hours. Lipid Profile: No results for input(s): CHOL, HDL, LDLCALC, TRIG, CHOLHDL, LDLDIRECT in the last 72 hours. Thyroid function studies: No results for input(s): TSH, T4TOTAL, T3FREE, THYROIDAB in the last 72 hours.  Invalid input(s): FREET3 Anemia work up: No results for input(s): VITAMINB12, FOLATE, FERRITIN, TIBC, IRON, RETICCTPCT in the last 72 hours. Sepsis Labs: Recent Labs  Lab 09/19/20 0415 09/23/20 0107 09/24/20 0149 09/25/20 0151  WBC 12.4* 10.1 10.0 8.3   Microbiology Recent Results (from the past 240 hour(s))  Culture, blood (Routine X 2) w Reflex to ID Panel     Status: None   Collection Time: 09/16/20 10:10 AM   Specimen: BLOOD RIGHT HAND  Result Value Ref Range Status   Specimen Description BLOOD RIGHT HAND  Final   Special Requests   Final    BOTTLES DRAWN AEROBIC ONLY Blood Culture results may not be optimal due to an inadequate volume of blood received in culture bottles   Culture   Final    NO GROWTH 5 DAYS Performed at [facility], [name] [address] [place], [place] [address]    Report Status 09/21/2020 FINAL  Final  Culture, blood (Routine X 2) w Reflex to ID Panel     Status: None   Collection Time: 09/16/20 10:10 AM   Specimen: BLOOD RIGHT HAND  Result Value Ref Range Status   Specimen Description BLOOD RIGHT HAND  Final   Special Requests   Final    BOTTLES DRAWN AEROBIC ONLY Blood Culture results may not be optimal due to an inadequate volume of blood received in culture bottles   Culture   Final    NO GROWTH 5 DAYS Performed at [facility], [place] [address] [place], [name] [address]    Report Status 09/21/2020 FINAL  Final  SARS Coronavirus 2 by RT PCR (hospital order, performed in [facility] hospital lab) Nasopharyngeal Nasopharyngeal Swab     Status: None    Collection Time: 09/24/20  1:10 AM   Specimen: Nasopharyngeal Swab  Result Value Ref Range Status   SARS Coronavirus 2 NEGATIVE NEGATIVE Final    Comment: (NOTE) SARS-CoV-2 target nucleic acids are NOT DETECTED.  The SARS-CoV-2 RNA is generally detectable in upper and lower respiratory specimens during the acute phase of infection. The lowest concentration of SARS-CoV-2 viral copies this assay can detect is 250 copies / mL. A negative result does not preclude SARS-CoV-2 infection and should not be used as the sole basis for treatment or other patient management decisions.  A negative  result may occur with improper specimen collection / handling, submission of specimen other than nasopharyngeal swab, presence of viral mutation(s) within the areas targeted by this assay, and inadequate number of viral copies (<250 copies / mL). A negative result must be combined with clinical observations, patient history, and epidemiological information.  Fact Sheet for Patients:   StrictlyIdeas.no  Fact Sheet for Healthcare Providers: BankingDealers.co.za  This test is not yet approved or  cleared by the [place] FDA and has been authorized for detection and/or diagnosis of SARS-CoV-2 by FDA under an Emergency Use Authorization (EUA).  This EUA will remain in effect (meaning this test can be used) for the duration of the COVID-19 declaration under Section 564(b)(1) of the Act, 21 U.S.C. section 360bbb-3(b)(1), unless the authorization is terminated or revoked sooner.  Performed at [facility], [place] [address] [place], [place] [address]      Medications:    amiodarone  400 mg Oral Q12H   Followed by   Derrill Memo ON 10/07/2020] amiodarone  400 mg Oral Daily   apixaban  5 mg Oral BID   atorvastatin  80 mg Oral QHS   carvedilol  80 mg Oral Daily   Chlorhexidine Gluconate Cloth  6 each Topical Daily   clopidogrel  75 mg Oral Q  breakfast   docusate sodium  100 mg Oral BID   furosemide  40 mg Oral Daily   insulin aspart  0-20 Units Subcutaneous TID WC   insulin aspart  0-5 Units Subcutaneous QHS   insulin detemir  5 Units Subcutaneous BID   levETIRAcetam  500 mg Oral BID   pantoprazole  40 mg Oral Daily   polyethylene glycol  17 g Oral Daily   potassium chloride  10 mEq Oral Daily   sacubitril-valsartan  1 tablet Oral BID   sodium chloride flush  3 mL Intravenous Q12H   Continuous Infusions:  sodium chloride        LOS: 16 days   Charlynne Cousins  [facility]  09/25/2020, 11:27 AM

## 2020-09-25 NOTE — Progress Notes (Signed)
Occupational Therapy Treatment Patient Details Name: Braedyn Kauk MRN: [id number] DOB: Apr 27, 1961 Today's Date: 09/25/2020    History of present illness Leondro Chinonso Linker is an 59 y.o. male past medical history of coronary artery disease, chronic diastolic heart failure, ischemic cardiomyopathy essential hypertension status post cardiac defibrillation who initially presented to the ED with nausea vomiting and diarrhea was found to be in acute kidney injury requiring vasopressors initially admitted to the ICU infectious work-up turned out to be negative.  He then developed A. fib with RVR cardiology was consulted.Procedure  DC Cardioversion 10/6.   OT comments  Pt was cooperative with therapy. Pt. Was afraid of his HR elevating and getting shocked which limited treatment. Pt. Was willing to sit on EOB and stand with walker. Pt. Refused to attempt transfer. Pt. HR did not elevate and remained in the 80s. Pt. Was informed of this but still refused to transfer to chair. Pt. Worked on ADLs sitting EOB.   Follow Up Recommendations  CIR    Equipment Recommendations  Tub/shower seat    Recommendations for Other Services Rehab consult    Precautions / Restrictions Precautions Precautions: Fall Restrictions Weight Bearing Restrictions: No       Mobility Bed Mobility Overal bed mobility: Needs Assistance       Supine to sit: Supervision        Transfers Overall transfer level: Needs assistance Equipment used: Rolling walker (2 wheeled) Transfers: Sit to/from Stand Sit to Stand: Min assist         General transfer comment: Pt. was cued on proper hand placement but refused to comply. Pt. refused to perform stand pivot transfer secondary to fear of hr increasing.     Balance     Sitting balance-Leahy Scale: Good       Standing balance-Leahy Scale: Fair                             ADL either performed or assessed with clinical judgement    ADL Overall ADL's : Needs assistance/impaired     Grooming: Set up;Wash/dry face;Oral care;Wash/dry hands;Sitting           Upper Body Dressing : Set up;Sitting   Lower Body Dressing: Moderate assistance;Sit to/from stand               Functional mobility during ADLs:  (Pt. was Min A with sit to stand. Pt. refused to transfer.)       Vision   Vision Assessment?: No apparent visual deficits   Perception     Praxis      Cognition Arousal/Alertness: Awake/alert Behavior During Therapy: WFL for tasks assessed/performed Overall Cognitive Status: Within Functional Limits for tasks assessed                                          Exercises     Shoulder Instructions       General Comments Pt. HR was in the 80's and did not increase in standing but refused to attempt transfer.     Pertinent Vitals/ Pain       Pain Assessment: 0-10 Pain Score: 8  Pain Location: back Pain Descriptors / Indicators: Aching Pain Intervention(s): Premedicated before session;Patient requesting pain meds-RN notified  Home Living  Prior Functioning/Environment              Frequency  Min 2X/week        Progress Toward Goals  OT Goals(current goals can now be found in the care plan section)  Progress towards OT goals: Progressing toward goals  Acute Rehab OT Goals Patient Stated Goal: i want to walk and get out of here OT Goal Formulation: With patient Time For Goal Achievement: 10/03/20 Potential to Achieve Goals: Good ADL Goals Pt Will Perform Lower Body Bathing: with supervision;sit to/from stand Pt Will Perform Lower Body Dressing: with supervision;sit to/from stand Pt Will Transfer to Toilet: with modified independence;ambulating Pt Will Perform Toileting - Clothing Manipulation and hygiene: with modified independence;sit to/from stand  Plan      Co-evaluation                  AM-PAC OT "6 Clicks" Daily Activity     Outcome Measure   Help from another person eating meals?: None Help from another person taking care of personal grooming?: None Help from another person toileting, which includes using toliet, bedpan, or urinal?: A Lot Help from another person bathing (including washing, rinsing, drying)?: A Lot Help from another person to put on and taking off regular upper body clothing?: A Little Help from another person to put on and taking off regular lower body clothing?: A Lot 6 Click Score: 17    End of Session    OT Visit Diagnosis: Unsteadiness on feet (R26.81);Other abnormalities of gait and mobility (R26.89);Muscle weakness (generalized) (M62.81);Pain   Activity Tolerance Patient tolerated treatment well   Patient Left in bed;with call bell/phone within reach;with nursing/sitter in room (Pt. sitting EOB)   Nurse Communication  (ok therapy)        Time: 1443-1540 OT Time Calculation (min): 30 min  Charges: OT General Charges $OT Visit: 1 Visit OT Treatments $Self Care/Home Management : 8-22 mins $Therapeutic Activity: 8-22 mins  Reece Packer OT/L    Levorn Oleski 09/25/2020, 11:58 AM

## 2020-09-25 NOTE — Progress Notes (Signed)
Inpatient Rehabilitation Admissions Coordinator  I await insurance approval and medical workup completion to admit to Cir. I met with patient at bedside and he is in agreement.  Danne Baxter, RN, MSN Rehab Admissions Coordinator [phone] 09/25/2020 1:35 PM

## 2020-09-26 DIAGNOSIS — G40909 Epilepsy, unspecified, not intractable, without status epilepticus: Secondary | ICD-10-CM

## 2020-09-26 LAB — GLUCOSE, CAPILLARY
Glucose-Capillary: 187 mg/dL — ABNORMAL HIGH (ref 70–99)
Glucose-Capillary: 124 mg/dL — ABNORMAL HIGH (ref 70–99)
Glucose-Capillary: 185 mg/dL — ABNORMAL HIGH (ref 70–99)
Glucose-Capillary: 137 mg/dL — ABNORMAL HIGH (ref 70–99)
Glucose-Capillary: 94 mg/dL (ref 70–99)

## 2020-09-26 LAB — MAGNESIUM: Magnesium: 1.9 mg/dL (ref 1.7–2.4)

## 2020-09-26 MED ORDER — HYDROXYZINE HCL 10 MG PO TABS
10.0000 mg | ORAL_TABLET | Freq: Three times a day (TID) | ORAL | Status: DC | PRN
  Administered 2020-09-26 – 2020-09-29 (×8): 10 mg via ORAL
  Filled 2020-09-26 (×10): qty 1

## 2020-09-26 MED ORDER — FUROSEMIDE 40 MG PO TABS
40.0000 mg | ORAL_TABLET | Freq: Two times a day (BID) | ORAL | Status: DC
  Administered 2020-09-26 – 2020-09-27 (×3): 40 mg via ORAL
  Filled 2020-09-26 (×3): qty 1

## 2020-09-26 NOTE — Progress Notes (Signed)
Pt placed on CPAP with full face mask tolerating well

## 2020-09-26 NOTE — Progress Notes (Signed)
Mobility Specialist - Progress Note   09/26/20 1400  Mobility  Activity Ambulated in hall  Level of Assistance Modified independent, requires aide device or extra time  Assistive Device Front wheel walker  Distance Ambulated (ft) 64 ft  Mobility Response Tolerated fair  Mobility performed by Mobility specialist  $Mobility charge 1 Mobility    Pre-mobility: 75 HR, 107/63 BP, 98% SpO2 During mobility: 90 HR Post-mobility: 80 HR, 99/73 BP, 97% SpO2  Pt c/o L knee weakness towards the end of ambulation. He ambulated on 1 L of O2. Pt back in bed afterwards.  Pricilla Handler Mobility Specialist Mobility Specialist Phone: [phone]

## 2020-09-26 NOTE — Progress Notes (Signed)
Progress Note  Patient Name: Gregory [name] Date of Encounter: 09/26/2020  Primary Cardiologist: Sanda Klein, MD   Subjective   Gregory [name] is a 59 y.o. 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 Sabra Heck.  Had new atrial fibrillation.  Initially seen 09/09/20.  DCCV Twice through course. LCP 10/11 notable for PCWP of 7, with patient Cors Discussed with EP and planned for PO Amiodarone for suppression  Patient notes feeling that he can finally feel his legs.  No palpitations. Awaiting approval for rehab.  Inpatient Medications    Scheduled Meds:  amiodarone  400 mg Oral Q12H   Followed by   Derrill Memo ON 10/07/2020] amiodarone  400 mg Oral Daily   apixaban  5 mg Oral BID   atorvastatin  80 mg Oral QHS   carvedilol  80 mg Oral Daily   Chlorhexidine Gluconate Cloth  6 each Topical Daily   clopidogrel  75 mg Oral Q breakfast   docusate sodium  100 mg Oral BID   furosemide  40 mg Oral Daily   insulin aspart  0-20 Units Subcutaneous TID WC   insulin aspart  0-5 Units Subcutaneous QHS   insulin detemir  5 Units Subcutaneous BID   levETIRAcetam  500 mg Oral BID   pantoprazole  40 mg Oral Daily   polyethylene glycol  17 g Oral Daily   potassium chloride  10 mEq Oral Daily   sacubitril-valsartan  1 tablet Oral BID   sodium chloride flush  3 mL Intravenous Q12H   Continuous Infusions:  sodium chloride     PRN Meds: sodium chloride, acetaminophen, acetaminophen, hydrocortisone cream, hydrOXYzine, metoprolol tartrate, morphine injection, ondansetron (ZOFRAN) IV, sodium chloride flush, traMADol, traZODone   Vital Signs    Vitals:   09/25/20 2331 09/26/20 0003 09/26/20 0437 09/26/20 0753  BP: 104/72  104/81 100/74  Pulse: 71 76 81 74  Resp: 16 19 20 14   Temp: 97.6 F (36.4 C)  97.9  F (36.6 C) 97.6 F (36.4 C)  TempSrc: Oral  Axillary Oral  SpO2: 93% 94% 97% 98%  Weight:   (!) 139.7 kg   Height:        Intake/Output Summary (Last 24 hours) at 09/26/2020 0955 Last data filed at 09/26/2020 0934 Gross per 24 hour  Intake 603 ml  Output 1525 ml  Net -922 ml   Filed Weights   09/24/20 0405 09/25/20 0644 09/26/20 0437  Weight: (!) 140.2 kg (!) 137.4 kg (!) 139.7 kg    Telemetry    No NSVT or VT - Personally Reviewed  ECG    No new - Personally Reviewed  Physical Exam   GEN: No acute distress.   Neck: No JVD Cardiac: RRR, no murmurs, rubs, or gallops.  Respiratory: Clear to auscultation bilaterally. GI: Soft, nontender, non-distended  MS:+1 edema; No deformity. Stable from 10/12 Neuro:  Nonfocal  Psych: Normal affect   Labs    Chemistry Recent Labs  Lab 09/23/20 0107 09/23/20 0107 09/24/20 0149 09/24/20 1501 09/24/20 1502 09/24/20 1509 09/25/20 0151  NA 134*   < > 136   < > 138 139 137  K 3.8   < > 3.7   < > 4.2 4.0 4.5  CL 96*  --  97*  --   --   --  101  CO2 32  --  31  --   --   --  30  GLUCOSE 191*  --  190*  --   --   --  159*  BUN 12  --  9  --   --   --  9  CREATININE 0.85  --  0.88  --   --   --  0.86  CALCIUM 7.7*  --  7.7*  --   --   --  7.8*  GFRNONAA >60  --  >60  --   --   --  >60  ANIONGAP 6  --  8  --   --   --  6   < > = values in this interval not displayed.     Hematology Recent Labs  Lab 09/23/20 0107 09/23/20 0107 09/24/20 0149 09/24/20 1501 09/24/20 1502 09/24/20 1509 09/25/20 0151  WBC 10.1  --  10.0  --   --   --  8.3  RBC 3.09*  --  3.06*  --   --   --  3.02*  HGB 10.3*   < > 10.1*   < > 11.6* 10.5* 9.9*  HCT 31.1*   < > 31.1*   < > 34.0* 31.0* 30.6*  MCV 100.6*  --  101.6*  --   --   --  101.3*  MCH 33.3  --  33.0  --   --   --  32.8  MCHC 33.1  --  32.5  --   --   --  32.4  RDW 17.5*  --  17.2*  --   --   --  17.0*  PLT 279  --  262  --   --   --  251   < > = values in this interval not  displayed.     Radiology    CARDIAC CATHETERIZATION  Result Date: 09/24/2020  Ost RCA to Mid RCA lesion is 100% stenosed.  Previously placed 1st Mrg stent (unknown type) is widely patent.  Prox Cx lesion is 100% stenosed.  Previously placed 1st Diag stent (unknown type) is widely patent.  Previously placed Prox LAD to Mid LAD stent (unknown type) is widely patent.  Gregory [name] is a 59 y.o. male  [id number] LOCATION:  FACILITY: [facility] PHYSICIAN: [name], M.D. [birth year] DATE OF PROCEDURE:  09/24/2020 DATE OF DISCHARGE: CARDIAC CATHETERIZATION History obtained from chart review. Gregory [name] is a 59 y.o. 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 Sabra Heck.  Had new atrial fibrillation.  Initially seen 09/09/20.  DCCV Twice through course.  Because of recurrent need for defibrillation for ventricular tachycardia it was elected to proceed with right left heart cath to rule out an ischemic etiology.  His cardiac enzymes were negative.   Gregory [name] stents appear to be widely patent.  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  I suspect future caths should occur via the femoral approach given difficulty in cannulating the left main.  His right heart pressures suggest that he is euvolemic.  A Mynx closure device was used to create hemostasis in the right common femoral venous access site.  The sheath was removed and a radial band was placed on the right wrist  to achieve hemostasis.  The patient left lab in stable condition. [name]. MD, [facility] 09/24/2020 3:34 PM    Cardiac Studies   IMPRESSIONS    1. Left ventricular ejection fraction, by estimation, is 40 to 45%. The  left ventricle has mildly decreased function. The left ventricle  demonstrates regional wall motion  abnormalities (see scoring  diagram/findings for description). Left ventricular  diastolic parameters are consistent with Grade I diastolic dysfunction  (impaired relaxation).   2. Right ventricular systolic function was not well visualized. The right  ventricular size is normal.   3. The mitral valve is grossly normal. No evidence of mitral valve  regurgitation.   4. The aortic valve was not well visualized.   5. The inferior vena cava is normal in size with greater than 50%  respiratory variability, suggesting right atrial pressure of 3 mmHg.   Comparison(s): Slight decrease in LV function with different caliber  (non-contrasted) study. Slight decrease in global LVEF.   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 -Continuing 400 mg oral load continuing to 10/04/19 ; then 400 mg daily - EP Following- At discussion 09/25/20 recommendation was for amiodarone and BB for suppression -  DOAC and plavix - now on Coreg 80 mg CR (home dose) - on statin - question of device alarm; will arrange interrogation  HFmrEF - euvolemic -Returned to 40 mg lasix PO-> will increase to 40 mg PO BID based on I/Os - continue Entresto - Coreg as above - reasonable for cardiac rehab   Persistent atrial fibrillation - CHASDVASC 3 and s/p DCCV -History includes early return of atrial fibrillation after cardioversion on 9/30, given amiodarone load and repeat cardioversion on 09/19/2020 in sinus rhythm now.   - Back to Eliquis -Amio as above, Coreg as above - will need outpatient follow up for TFTs, LFTs, and PFTs  Potential 09/27/20 DC to inpatient rehab  For questions or updates, please contact [facility] Please consult [email or web address] for contact info under Cardiology/STEMI.      Signed, Werner Lean, MD  09/26/2020, 9:55 AM

## 2020-09-26 NOTE — Progress Notes (Signed)
Applied hydrocortisone cream two times between 0800-1300, applying it again at 1544.

## 2020-09-26 NOTE — Progress Notes (Signed)
Inpatient Rehabilitation Admissions Coordinator  I await insurance approval to admit patient to [facility].  Danne Baxter, RN, MSN Rehab Admissions Coordinator [phone] 09/26/2020 1:17 PM

## 2020-09-26 NOTE — Progress Notes (Signed)
[facility] PROGRESS NOTE    Progress Note  Gregory [name]  GUY:[id number] DOB: 09-17-1961 DOA: 09/09/2020 PCP: Patient, No Pcp Per     Brief Narrative:   Gregory [name] is an 59 y.o. male past medical history of coronary artery disease, chronic diastolic heart failure, ischemic cardiomyopathy essential hypertension status post cardiac defibrillation who initially presented to the ED with nausea vomiting and diarrhea was found to be in acute kidney injury requiring vasopressors initially admitted to the ICU infectious work-up turned out to be negative.  He then developed A. 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 EP was consulted and recommended DCCV on 09/19/2020. Patient did develop acute confusion, leukocytosis and was suspected to be in sepsis due to UTI started empirically on antibiotics.  Assessment/Plan:   Ventricular fibrillation status post AICD and CAD/A. fib with RVR CHADS Vasc score greater than 4. Had a burst of fast heart rate, continue amiodarone 400 loading dose until 10/03/2020. Cath on 09/24/2020 no new obstructive disease, will continue Eliquis. Social worker is working try to get him approved to inpatient rehab. Continue aspirin and Eliquis. Keep potassium greater than 4 magnesium greater than 2. Further changes per cards, awaiting inpatient rehab admission  Persistent atrial fibrillation with RVR: With a chads Vasc score greater than 4, he is status post 2 cardioversions currently in sinus rhythm. To new amiodarone load on 11/23/2020 He will have to go home on Eliquis and amiodarone 400 mg p.o. daily.  Acute confusional state/acute encephalopathy: Likely due to infectious etiology CT scan unremarkable unable to do MRI due to AICD. His mental status has  returned to baseline he is neurologically intact.  Severe sepsis due to UTI: Started empirically on IV Rocephin on admission is in the cephalopathy resolved. He completed his course of IV antibiotics in house. Culture data has remained negative till date.  Acute on chronic diastolic heart failure: Appears euvolemic, change to oral Lasix. He is diuresing well now about 3 L at half negative. Continue Entresto and Coreg.  Acute kidney injury: Likely prerenal in the setting of A. fib with RVR with a baseline less than 1. Resolved with IV fluid hydration.    Thrombocytopenia: After starting heparin head bodies were negative there is no evidence of bleeding after stopping heparin his platelets are improving.  Hypocalcemia: Was giving calcium gluconate now resolved.  Coronary artery disease: Status post AICD, denies any chest pain or shortness of breath.  Macrocytic anemia: Folate of 300, B12 was 683. Iron of 27 ferritin of 1000.  Chronic combined systolic and diastolic heart failure (HCC) Appears euvolemic on physical exam KVO IV fluids last echo showed an EF of 55%. Further management per cards. On a stable dose of diuretics.  Obstructive sleep apnea: Continue CPAP at night.  Mild transaminitis: Abdominal ultrasound was negative for acute cholecystitis he denies any abdominal pain. Likely cardiovascular mediated now resolved.  Newly diagnosed diabetes mellitus type 2: A1c 6.9, continue long-acting insulin plus sliding scale.  Seizures: Continue Keppra.   DVT prophylaxis: Eliquis Family Communication:none Status is: Inpatient  Remains inpatient appropriate because:Hemodynamically unstable   Dispo: The patient is from: Home              Anticipated d/c is to: [facility]              Anticipated d/c date is: 2 days  Patient currently patient is medically stable transferred to [facility].  Awaiting [facility] recommendations.        Code Status:     Code Status  Orders  (From admission, onward)         Start     Ordered   09/09/20 1642  Full code  Continuous        09/09/20 1643        Code Status History    Date Active Date Inactive Code Status Order ID Comments User Context   06/07/2018 2227 06/09/2018 1730 Full Code [id number]  Jani Gravel, MD Inpatient   12/19/2016 1651 12/19/2016 2113 Full Code [id number]  [name], MD Inpatient   05/04/2016 2100 05/07/2016 2158 Full Code [id number]  Jerline Pain, MD Inpatient   Advance Care Planning Activity        IV Access:    Peripheral IV   Procedures and diagnostic studies:   CARDIAC CATHETERIZATION  Result Date: 09/24/2020  Ost RCA to Mid RCA lesion is 100% stenosed.  Previously placed 1st Mrg stent (unknown type) is widely patent.  Prox Cx lesion is 100% stenosed.  Previously placed 1st Diag stent (unknown type) is widely patent.  Previously placed Prox LAD to Mid LAD stent (unknown type) is widely patent.  Gregory [name] is a 59 y.o. male  [id number] LOCATION:  FACILITY: [place] PHYSICIAN: [name], M.D. 1961/01/16 DATE OF PROCEDURE:  09/24/2020 DATE OF DISCHARGE: CARDIAC CATHETERIZATION History obtained from chart review.Hewitt Washington [name] a 59 y.o.malewith a hx of CAD (known chronic occl of RCA with prior PCIs to LAD, diag, and LCx)), CHF(HFmrEF with evaluation as low as 45%, but recently 60-65%)and VT s/p ICDwho is being seen after shockat the request of Dr. Sabra Heck. Had new atrial fibrillation. Initially seen 09/09/20. DCCV Twice through course.  Because of recurrent need for defibrillation for ventricular tachycardia it was elected to proceed with right left heart cath to rule out an ischemic etiology.  His cardiac enzymes were negative.   Mr. Rafalski stents appear to be widely patent.  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  I suspect future caths should occur via the femoral approach given difficulty in cannulating the left  main.  His right heart pressures suggest that he is euvolemic.  A Mynx closure device was used to create hemostasis in the right common femoral venous access site.  The sheath was removed and a radial band was placed on the right wrist to achieve hemostasis.  The patient left lab in stable condition. [name]. MD, [facility] 09/24/2020 3:34 PM     Medical Consultants:    None.  Anti-Infectives:   keflex  Subjective:    Gregory [name] relate an episode of lightheadedness  Objective:    Vitals:   09/25/20 2331 09/26/20 0003 09/26/20 0437 09/26/20 0753  BP: 104/72  104/81 100/74  Pulse: 71 76 81 74  Resp: 16 19 20 14   Temp: 97.6 F (36.4 C)  97.9 F (36.6 C) 97.6 F (36.4 C)  TempSrc: Oral  Axillary Oral  SpO2: 93% 94% 97% 98%  Weight:   (!) 139.7 kg   Height:       SpO2: 98 % O2 Flow Rate (L/min): 2 L/min   Intake/Output Summary (Last 24 hours) at 09/26/2020 0920 Last data filed at 09/26/2020 0629 Gross per 24 hour  Intake 600 ml  Output 1525 ml  Net -925 ml   Filed Weights   09/24/20  0405 09/25/20 0644 09/26/20 0437  Weight: (!) 140.2 kg (!) 137.4 kg (!) 139.7 kg    Exam: General exam: In no acute distress. Respiratory system: Good air movement and clear to auscultation. Cardiovascular system: S1 & S2 heard, RRR. No JVD. Gastrointestinal system: Abdomen is nondistended, soft and nontender.  Extremities: No pedal edema. Skin: No rashes, lesions or ulcers  Data Reviewed:    Labs: Basic Metabolic Panel: Recent Labs  Lab 09/22/20 0637 09/22/20 0637 09/22/20 1137 09/22/20 1137 09/23/20 0107 09/23/20 0107 09/24/20 0149 09/24/20 0149 09/24/20 1501 09/24/20 1501 09/24/20 1502 09/24/20 1502 09/24/20 1509 09/25/20 0151 09/26/20 0811  NA 137   < > 136   < > 134*   < > 136  --  139  --  138  --  139 137  --   K 4.3   < > 4.3   < > 3.8   < > 3.7   < > 4.1   < > 4.2   < > 4.0 4.5  --   CL 98  --  97*  --  96*  --  97*  --   --   --   --    --   --  101  --   CO2 29  --  28  --  32  --  31  --   --   --   --   --   --  30  --   GLUCOSE 148*  --  199*  --  191*  --  190*  --   --   --   --   --   --  159*  --   BUN 12  --  12  --  12  --  9  --   --   --   --   --   --  9  --   CREATININE 0.89  --  0.99  --  0.85  --  0.88  --   --   --   --   --   --  0.86  --   CALCIUM 7.8*  --  7.8*  --  7.7*  --  7.7*  --   --   --   --   --   --  7.8*  --   MG  --   --  1.7  --  1.8  --  1.7  --   --   --   --   --   --  2.0 1.9   < > = values in this interval not displayed.   GFR Estimated Creatinine Clearance: 141.2 mL/min (by C-G formula based on SCr of 0.86 mg/dL). Liver Function Tests: No results for input(s): AST, ALT, ALKPHOS, BILITOT, PROT, ALBUMIN in the last 168 hours. No results for input(s): LIPASE, AMYLASE in the last 168 hours. No results for input(s): AMMONIA in the last 168 hours. Coagulation profile No results for input(s): INR, PROTIME in the last 168 hours. COVID-19 Labs  No results for input(s): DDIMER, FERRITIN, LDH, CRP in the last 72 hours.  Lab Results  Component Value Date   SARSCOV2NAA NEGATIVE 09/24/2020   [place] NEGATIVE 09/09/2020   [place] Not Detected 12/05/2019    CBC: Recent Labs  Lab 09/23/20 0107 09/23/20 0107 09/24/20 0149 09/24/20 1501 09/24/20 1502 09/24/20 1509 09/25/20 0151  WBC 10.1  --  10.0  --   --   --  8.3  HGB 10.3*   < >  10.1* 10.9* 11.6* 10.5* 9.9*  HCT 31.1*   < > 31.1* 32.0* 34.0* 31.0* 30.6*  MCV 100.6*  --  101.6*  --   --   --  101.3*  PLT 279  --  262  --   --   --  251   < > = values in this interval not displayed.   Cardiac Enzymes: No results for input(s): CKTOTAL, CKMB, CKMBINDEX, TROPONINI in the last 168 hours. BNP (last 3 results) No results for input(s): PROBNP in the last 8760 hours. CBG: Recent Labs  Lab 09/25/20 0636 09/25/20 1148 09/25/20 1628 09/25/20 2119 09/26/20 0615  GLUCAP 124* 162* 114* 187* 124*   D-Dimer: No results for  input(s): DDIMER in the last 72 hours. Hgb A1c: No results for input(s): HGBA1C in the last 72 hours. Lipid Profile: No results for input(s): CHOL, HDL, LDLCALC, TRIG, CHOLHDL, LDLDIRECT in the last 72 hours. Thyroid function studies: No results for input(s): TSH, T4TOTAL, T3FREE, THYROIDAB in the last 72 hours.  Invalid input(s): FREET3 Anemia work up: No results for input(s): VITAMINB12, FOLATE, FERRITIN, TIBC, IRON, RETICCTPCT in the last 72 hours. Sepsis Labs: Recent Labs  Lab 09/23/20 0107 09/24/20 0149 09/25/20 0151  WBC 10.1 10.0 8.3   Microbiology Recent Results (from the past 240 hour(s))  Culture, blood (Routine X 2) w Reflex to ID Panel     Status: None   Collection Time: 09/16/20 10:10 AM   Specimen: BLOOD RIGHT HAND  Result Value Ref Range Status   Specimen Description BLOOD RIGHT HAND  Final   Special Requests   Final    BOTTLES DRAWN AEROBIC ONLY Blood Culture results may not be optimal due to an inadequate volume of blood received in culture bottles   Culture   Final    NO GROWTH 5 DAYS Performed at [facility], [place] [address], [place], [place] [address]    Report Status 09/21/2020 FINAL  Final  Culture, blood (Routine X 2) w Reflex to ID Panel     Status: None   Collection Time: 09/16/20 10:10 AM   Specimen: BLOOD RIGHT HAND  Result Value Ref Range Status   Specimen Description BLOOD RIGHT HAND  Final   Special Requests   Final    BOTTLES DRAWN AEROBIC ONLY Blood Culture results may not be optimal due to an inadequate volume of blood received in culture bottles   Culture   Final    NO GROWTH 5 DAYS Performed at [facility], [place] [address], [place], [place] [address]    Report Status 09/21/2020 FINAL  Final  SARS Coronavirus 2 by RT PCR (hospital order, performed in [facility] hospital lab) Nasopharyngeal Nasopharyngeal Swab     Status: None   Collection Time: 09/24/20  1:10 AM   Specimen: Nasopharyngeal Swab  Result Value Ref Range  Status   SARS Coronavirus 2 NEGATIVE NEGATIVE Final    Comment: (NOTE) SARS-CoV-2 target nucleic acids are NOT DETECTED.  The SARS-CoV-2 RNA is generally detectable in upper and lower respiratory specimens during the acute phase of infection. The lowest concentration of SARS-CoV-2 viral copies this assay can detect is 250 copies / mL. A negative result does not preclude SARS-CoV-2 infection and should not be used as the sole basis for treatment or other patient management decisions.  A negative result may occur with improper specimen collection / handling, submission of specimen other than nasopharyngeal swab, presence of viral mutation(s) within the areas targeted by this assay,  and inadequate number of viral copies (<250 copies / mL). A negative result must be combined with clinical observations, patient history, and epidemiological information.  Fact Sheet for Patients:   StrictlyIdeas.no  Fact Sheet for Healthcare Providers: BankingDealers.co.za  This test is not yet approved or  cleared by the [place] FDA and has been authorized for detection and/or diagnosis of SARS-CoV-2 by FDA under an Emergency Use Authorization (EUA).  This EUA will remain in effect (meaning this test can be used) for the duration of the COVID-19 declaration under Section 564(b)(1) of the Act, 21 U.S.C. section 360bbb-3(b)(1), unless the authorization is terminated or revoked sooner.  Performed at [facility], [place] [address], [place], [place] [address]      Medications:    amiodarone  400 mg Oral Q12H   Followed by   Derrill Memo ON 10/07/2020] amiodarone  400 mg Oral Daily   apixaban  5 mg Oral BID   atorvastatin  80 mg Oral QHS   carvedilol  80 mg Oral Daily   Chlorhexidine Gluconate Cloth  6 each Topical Daily   clopidogrel  75 mg Oral Q breakfast   docusate sodium  100 mg Oral BID   furosemide  40 mg Oral Daily   insulin  aspart  0-20 Units Subcutaneous TID WC   insulin aspart  0-5 Units Subcutaneous QHS   insulin detemir  5 Units Subcutaneous BID   levETIRAcetam  500 mg Oral BID   pantoprazole  40 mg Oral Daily   polyethylene glycol  17 g Oral Daily   potassium chloride  10 mEq Oral Daily   sacubitril-valsartan  1 tablet Oral BID   sodium chloride flush  3 mL Intravenous Q12H   Continuous Infusions:  sodium chloride        LOS: 17 days   Charlynne Cousins  [facility]  09/26/2020, 9:20 AM

## 2020-09-27 ENCOUNTER — Telehealth: Payer: Self-pay | Admitting: Internal Medicine

## 2020-09-27 LAB — GLUCOSE, CAPILLARY
Glucose-Capillary: 116 mg/dL — ABNORMAL HIGH (ref 70–99)
Glucose-Capillary: 149 mg/dL — ABNORMAL HIGH (ref 70–99)
Glucose-Capillary: 97 mg/dL (ref 70–99)
Glucose-Capillary: 129 mg/dL — ABNORMAL HIGH (ref 70–99)

## 2020-09-27 LAB — MAGNESIUM: Magnesium: 1.8 mg/dL (ref 1.7–2.4)

## 2020-09-27 MED ORDER — MAGNESIUM OXIDE 400 (241.3 MG) MG PO TABS
200.0000 mg | ORAL_TABLET | Freq: Every day | ORAL | Status: DC
  Administered 2020-09-27 – 2020-09-29 (×3): 200 mg via ORAL
  Filled 2020-09-27 (×3): qty 1

## 2020-09-27 NOTE — Telephone Encounter (Signed)
Dr. Michaelle Copas is calling requesting to speak with Dr. Caryl Comes or one of the EP PA's in regards to a medication request for the patient. Please advise.

## 2020-09-27 NOTE — Progress Notes (Signed)
Mobility Specialist - Progress Note   09/27/20 1331  Mobility  Activity Refused mobility   Pt states he is fatigued from just bathing himself and PT earlier. Will f/u as able.  Pricilla Handler Mobility Specialist Mobility Specialist Phone: [phone]

## 2020-09-27 NOTE — Progress Notes (Signed)
Physical Therapy Treatment Patient Details Name: Gregory [name] MRN: [id number] DOB: 1961/05/20 Today's Date: 09/27/2020    History of Present Illness Gregory [name] is an 59 y.o. male past medical history of coronary artery disease, chronic diastolic heart failure, ischemic cardiomyopathy essential hypertension status post cardiac defibrillation who initially presented to the ED with nausea vomiting and diarrhea was found to be in acute kidney injury requiring vasopressors initially admitted to the ICU infectious work-up turned out to be negative.  He then developed A. fib with RVR cardiology was consulted.Procedure  DC Cardioversion 10/6.    PT Comments    Pt making slow, steady progress. Continues to be anxious over heart rate. Feel he can benefit from intensity and structure of CIR.    Follow Up Recommendations  CIR     Equipment Recommendations  Rolling walker with 5" wheels;3in1 (PT)    Recommendations for Other Services       Precautions / Restrictions Precautions Precautions: Fall Restrictions Weight Bearing Restrictions: No    Mobility  Bed Mobility Overal bed mobility: Modified Independent Bed Mobility: Supine to Sit;Sit to Supine     Supine to sit: Modified independent (Device/Increase time);HOB elevated Sit to supine: Modified independent (Device/Increase time);HOB elevated      Transfers Overall transfer level: Needs assistance Equipment used: Rolling walker (2 wheeled) Transfers: Sit to/from Stand Sit to Stand: Min assist;From elevated surface         General transfer comment: Assist to bring hips up and to stabilize the walker. Bed elevated prior to stand. Verbal cues for pt to push up from the bed but pt refuses and continues to pull up on walker. Wanted pt to attempt to stand without the use of the walker but he declined.   Ambulation/Gait Ambulation/Gait assistance: Min assist Gait Distance (Feet): 75 Feet Assistive device:  Rolling walker (2 wheeled) Gait Pattern/deviations: Step-through pattern;Decreased stride length;Trunk flexed Gait velocity: decr Gait velocity interpretation: <1.8 ft/sec, indicate of risk for recurrent falls General Gait Details: Assist for stability and safety. Pt anxious about HR while mobilizing   Stairs             Wheelchair Mobility    Modified Rankin (Stroke Patients Only)       Balance Overall balance assessment: Needs assistance Sitting-balance support: No upper extremity supported;Feet supported Sitting balance-Leahy Scale: Good     Standing balance support: Bilateral upper extremity supported Standing balance-Leahy Scale: Poor Standing balance comment: walker and supervision                            Cognition Arousal/Alertness: Awake/alert Behavior During Therapy: Anxious Overall Cognitive Status: Within Functional Limits for tasks assessed                                 General Comments: Pt very fearful of HR going up and getting shocked again.       Exercises      General Comments General comments (skin integrity, edema, etc.): HR 90-108 during session. Pt on 3L O2 at rest with SpO2 98%. Pt would not remove nasal cannula because he said if his heart started getting too high he needed the O2 to calm his breathing. Left nasal cannula in but O2 turned off at tank and SpO2 > 92% on RA with amb.       Pertinent Vitals/Pain Pain Assessment: No/denies pain  Home Living                      Prior Function            PT Goals (current goals can now be found in the care plan section) Progress towards PT goals: Progressing toward goals    Frequency    Min 3X/week      PT Plan Current plan remains appropriate    Co-evaluation              AM-PAC PT "6 Clicks" Mobility   Outcome Measure  Help needed turning from your back to your side while in a flat bed without using bedrails?: None Help  needed moving from lying on your back to sitting on the side of a flat bed without using bedrails?: None Help needed moving to and from a bed to a chair (including a wheelchair)?: A Little Help needed standing up from a chair using your arms (e.g., wheelchair or bedside chair)?: A Little Help needed to walk in hospital room?: A Little Help needed climbing 3-5 steps with a railing? : A Lot 6 Click Score: 19    End of Session Equipment Utilized During Treatment: Gait belt Activity Tolerance: Patient limited by fatigue Patient left: in bed;with call bell/phone within reach Nurse Communication: Mobility status PT Visit Diagnosis: Other abnormalities of gait and mobility (R26.89);Muscle weakness (generalized) (M62.81)     Time: 0865-7846 PT Time Calculation (min) (ACUTE ONLY): 16 min  Charges:  $Gait Training: 8-22 mins                     Hurt Pager [phone] Office [place] 09/27/2020, 11:01 AM

## 2020-09-27 NOTE — Progress Notes (Addendum)
Progress Note  Patient Name: Gregory [name] Date of Encounter: 09/27/2020  Primary Cardiologist: Sanda Klein, MD   Subjective   Gregory [name] is a 59 y.o. 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 Sabra Heck.  Had new atrial fibrillation.  Initially seen 09/09/20.  DCCV Twice through course. LCP 10/11 notable for PCWP of 7, with patient Cors Discussed with EP and planned for PO Amiodarone for suppression  Pt doing well, looking forward to rehab, but has developed an itchy rash on back, improved w/ steroid cream   Inpatient Medications    Scheduled Meds:  amiodarone  400 mg Oral Q12H   Followed by   Derrill Memo ON 10/07/2020] amiodarone  400 mg Oral Daily   apixaban  5 mg Oral BID   atorvastatin  80 mg Oral QHS   carvedilol  80 mg Oral Daily   Chlorhexidine Gluconate Cloth  6 each Topical Daily   clopidogrel  75 mg Oral Q breakfast   docusate sodium  100 mg Oral BID   furosemide  40 mg Oral BID   insulin aspart  0-20 Units Subcutaneous TID WC   insulin aspart  0-5 Units Subcutaneous QHS   insulin detemir  5 Units Subcutaneous BID   levETIRAcetam  500 mg Oral BID   pantoprazole  40 mg Oral Daily   polyethylene glycol  17 g Oral Daily   potassium chloride  10 mEq Oral Daily   sacubitril-valsartan  1 tablet Oral BID   sodium chloride flush  3 mL Intravenous Q12H   Continuous Infusions:  sodium chloride     PRN Meds: sodium chloride, acetaminophen, acetaminophen, hydrocortisone cream, hydrOXYzine, metoprolol tartrate, morphine injection, ondansetron (ZOFRAN) IV, sodium chloride flush, traMADol, traZODone   Vital Signs    Vitals:   09/26/20 2035 09/26/20 2337 09/27/20 0620 09/27/20 0807  BP: 105/74  103/76 103/83  Pulse: 70  90 76  Resp: 14  12 14   Temp: 98.4 F (36.9 C)  98.2  F (36.8 C) 98.4 F (36.9 C)  TempSrc: Oral  Axillary Axillary  SpO2: 96% 95% 96% 99%  Weight:      Height:        Intake/Output Summary (Last 24 hours) at 09/27/2020 0837 Last data filed at 09/27/2020 0500 Gross per 24 hour  Intake 903 ml  Output 1360 ml  Net -457 ml   Filed Weights   09/24/20 0405 09/25/20 0644 09/26/20 0437  Weight: (!) 140.2 kg (!) 137.4 kg (!) 139.7 kg    Telemetry    SR, PAC, 1st deg AVB - Personally Reviewed  ECG    No new - Personally Reviewed  Physical Exam   GEN: No acute distress.   Neck: No JVD Cardiac: RRR, no murmur, no rubs, or gallops.  Respiratory: diminished to auscultation bilaterally  GI: Soft, nontender, non-distended  MS: No edema; No deformity. Neuro:  Nonfocal  Skin: erythematous rash on back Psych: Normal affect   Labs    Chemistry Recent Labs  Lab 09/23/20 0107 09/23/20 0107 09/24/20 0149 09/24/20 1501 09/24/20 1502 09/24/20 1509 09/25/20 0151  NA 134*   < > 136   < > 138 139 137  K 3.8   < > 3.7   < > 4.2 4.0 4.5  CL 96*  --  97*  --   --   --  101  CO2 32  --  31  --   --   --  30  GLUCOSE 191*  --  190*  --   --   --  159*  BUN 12  --  9  --   --   --  9  CREATININE 0.85  --  0.88  --   --   --  0.86  CALCIUM 7.7*  --  7.7*  --   --   --  7.8*  GFRNONAA >60  --  >60  --   --   --  >60  ANIONGAP 6  --  8  --   --   --  6   < > = values in this interval not displayed.     Hematology Recent Labs  Lab 09/23/20 0107 09/23/20 0107 09/24/20 0149 09/24/20 1501 09/24/20 1502 09/24/20 1509 09/25/20 0151  WBC 10.1  --  10.0  --   --   --  8.3  RBC 3.09*  --  3.06*  --   --   --  3.02*  HGB 10.3*   < > 10.1*   < > 11.6* 10.5* 9.9*  HCT 31.1*   < > 31.1*   < > 34.0* 31.0* 30.6*  MCV 100.6*  --  101.6*  --   --   --  101.3*  MCH 33.3  --  33.0  --   --   --  32.8  MCHC 33.1  --  32.5  --   --   --  32.4  RDW 17.5*  --  17.2*  --   --   --  17.0*  PLT 279  --  262  --   --   --  251   < > = values in  this interval not displayed.   Magnesium  Date Value Ref Range Status  09/27/2020 1.8 1.7 - 2.4 mg/dL Final    Comment:    Performed at [facility], [place] [address] [place], [place] [address]     Radiology    No results found.  Cardiac Studies   IMPRESSIONS    1. Left ventricular ejection fraction, by estimation, is 40 to 45%. The  left ventricle has mildly decreased function. The left ventricle  demonstrates regional wall motion abnormalities (see scoring  diagram/findings for description). Left ventricular  diastolic parameters are consistent with Grade I diastolic dysfunction  (impaired relaxation).   2. Right ventricular systolic function was not well visualized. The right  ventricular size is normal.   3. The mitral valve is grossly normal. No evidence of mitral valve  regurgitation.   4. The aortic valve was not well visualized.   5. The inferior vena cava is normal in size with greater than 50%  respiratory variability, suggesting right atrial pressure of 3 mmHg.   Comparison(s): Slight decrease in LV function with different caliber  (non-contrasted) study. Slight decrease in global LVEF.   Patient Profile     59 y.o. male PAF, HFrEV, VT, and CAD who presented after sepsis with pronged course  Assessment & Plan    VT s/p ICD - Keep K+ >/= 4, Mg >/=2, will start Mag Ox, needs Mg level in 1 week - amio 400 mg bid >> 400 mg qd on 10/20 >> 200 mg qd on 10/30 (MD advise) - continue Coreg CR 80 mg qd (home dose) and Eliquis -  device interrogated 10/13, 2 episode VT w/ shock, 2 episodes VT w/ ATP - OK to increase activity, keep HR < 120 if possible -   CAD:  - no ischemic sx when working w/ rehab despite CTO RCA - continue Plavix, no ASA 2nd Eliquis - on BB and high-dose Lipitor  Rash:  - likely drug related, will ask Pharmacist to come up with likely candidates (hopefully not amio)   Reasonable 09/27/20 DC to inpatient rehab  For questions or  updates, please contact [facility] Please consult [email or web address] for contact info under Cardiology/STEMI.      Signed, Rosaria Ferries, PA-C  09/27/2020, 8:37 AM    Personally seen and examined. Agree with APP Provider above with the following comments: 59 yo M with a hx of CAD, HFmrEF, VT, and PAF after long stay post sepsis - Returned to GDMT, on eliquis for PAF, presently in SR, presently euvolemic - amio with aggressive load has been best option for VT for this pt  Werner Lean, MD

## 2020-09-27 NOTE — Progress Notes (Signed)
[facility] PROGRESS NOTE    Progress Note  Gregory [name]  BZJ:[id number] DOB: [birth year] DOA: 09/09/2020 PCP: Patient, No Pcp Per     Brief Narrative:   Gregory [name] is an 59 y.o. male past medical history of coronary artery disease, chronic diastolic heart failure, ischemic cardiomyopathy essential hypertension status post cardiac defibrillation who initially presented to the ED with nausea vomiting and diarrhea was found to be in acute kidney injury requiring vasopressors initially admitted to the ICU infectious work-up turned out to be negative.  He then developed A. 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 EP was consulted and recommended DCCV on 09/19/2020, started on amio and tried again now in [place]. Patient did develop acute confusion, leukocytosis and was suspected to be in sepsis due to UTI started empirically on antibiotics.  Assessment/Plan:   Ventricular fibrillation status post AICD and CAD/A. fib with RVR CHADS Vasc score greater than 4. Had a burst of fast heart rate, continue amiodarone 400 loading dose until 10/03/2020. Cath on 09/24/2020 no new obstructive disease, will continue Eliquis. Social worker is working try to get him approved to inpatient rehab. Continue aspirin and Eliquis. Keep potassium greater than 4 magnesium greater than 2. Awaiting for insurance approval for inpatient rehab admission.  Persistent atrial fibrillation with RVR: With a chads Vasc score greater than 4, he is status post 2 cardioversions currently in sinus rhythm. To new amiodarone load on 11/23/2020 He will have to go home on Eliquis and amiodarone 400 mg p.o. daily.  Acute confusional state/acute encephalopathy: Likely due to infectious etiology CT scan unremarkable unable to do MRI  due to AICD. His mental status has returned to baseline he is neurologically intact.  Severe sepsis due to UTI: Started empirically on IV Rocephin on admission is in the cephalopathy resolved. He completed his course of IV antibiotics in house. Culture data has remained negative till date.  Acute on chronic diastolic heart failure: Appears euvolemic, change to oral Lasix. He is diuresing well now about 3 L at half negative. Continue Entresto and Coreg.  Acute kidney injury: Likely prerenal in the setting of A. fib with RVR with a baseline less than 1. Resolved with IV fluid hydration.    Thrombocytopenia: After starting heparin head bodies were negative there is no evidence of bleeding after stopping heparin his platelets are improving.  Hypocalcemia: Was giving calcium gluconate now resolved.  Coronary artery disease: Status post AICD, denies any chest pain or shortness of breath.  Macrocytic anemia: Folate of 300, B12 was 683. Iron of 27 ferritin of 1000.  Chronic combined systolic and diastolic heart failure (HCC) Appears euvolemic on physical exam KVO IV fluids last echo showed an EF of 55%. Further management per cards. On a stable dose of diuretics.  Obstructive sleep apnea: Continue CPAP at night.  Mild transaminitis: Abdominal ultrasound was negative for acute cholecystitis he denies any abdominal pain. Likely cardiovascular mediated now resolved.  Newly diagnosed diabetes mellitus type 2: A1c 6.9, continue long-acting insulin plus sliding scale.  Seizures: Continue Keppra.   DVT prophylaxis: Eliquis Family Communication:none Status is: Inpatient  Remains inpatient appropriate because:Hemodynamically unstable   Dispo: The patient is from: Home              Anticipated d/c is to: CIR  Anticipated d/c date is: 2 days              Patient currently patient is medically stable transferred to CIR.  Awaiting CIR  recommendations.        Code Status:     Code Status Orders  (From admission, onward)         Start     Ordered   09/09/20 1642  Full code  Continuous        09/09/20 1643        Code Status History    Date Active Date Inactive Code Status Order ID Comments User Context   06/07/2018 2227 06/09/2018 1730 Full Code [id number]  Jani Gravel, MD Inpatient   12/19/2016 1651 12/19/2016 2113 Full Code [id number]  [name], MD Inpatient   05/04/2016 2100 05/07/2016 2158 Full Code [id number]  Jerline Pain, MD Inpatient   Advance Care Planning Activity        IV Access:    Peripheral IV   Procedures and diagnostic studies:   No results found.   Medical Consultants:    None.  Anti-Infectives:   keflex  Subjective:    Gregory [name] no compalins  Objective:    Vitals:   09/26/20 2035 09/26/20 2337 09/27/20 0620 09/27/20 0807  BP: 105/74  103/76 103/83  Pulse: 70  90 76  Resp: 14  12 14   Temp: 98.4 F (36.9 C)  98.2 F (36.8 C) 98.4 F (36.9 C)  TempSrc: Oral  Axillary Axillary  SpO2: 96% 95% 96% 99%  Weight:      Height:       SpO2: 99 % O2 Flow Rate (L/min): 2 L/min   Intake/Output Summary (Last 24 hours) at 09/27/2020 1005 Last data filed at 09/27/2020 0500 Gross per 24 hour  Intake 800 ml  Output 1160 ml  Net -360 ml   Filed Weights   09/24/20 0405 09/25/20 0644 09/26/20 0437  Weight: (!) 140.2 kg (!) 137.4 kg (!) 139.7 kg    Exam: General exam: In no acute distress. Respiratory system: Good air movement and clear to auscultation. Cardiovascular system: S1 & S2 heard, RRR. No JVD. Gastrointestinal system: Abdomen is nondistended, soft and nontender.  Extremities: No pedal edema. Skin: No rashes, lesions or ulcers  Data Reviewed:    Labs: Basic Metabolic Panel: Recent Labs  Lab 09/22/20 0637 09/22/20 0637 09/22/20 1137 09/22/20 1137 09/23/20 0107 09/23/20 0107 09/24/20 0149 09/24/20 0149 09/24/20 1501  09/24/20 1501 09/24/20 1502 09/24/20 1502 09/24/20 1509 09/25/20 0151 09/26/20 0811 09/27/20 0038  NA 137   < > 136   < > 134*   < > 136  --  139  --  138  --  139 137  --   --   K 4.3   < > 4.3   < > 3.8   < > 3.7   < > 4.1   < > 4.2   < > 4.0 4.5  --   --   CL 98  --  97*  --  96*  --  97*  --   --   --   --   --   --  101  --   --   CO2 29  --  28  --  32  --  31  --   --   --   --   --   --  30  --   --   GLUCOSE 148*  --  199*  --  191*  --  190*  --   --   --   --   --   --  159*  --   --   BUN 12  --  12  --  12  --  9  --   --   --   --   --   --  9  --   --   CREATININE 0.89  --  0.99  --  0.85  --  0.88  --   --   --   --   --   --  0.86  --   --   CALCIUM 7.8*  --  7.8*  --  7.7*  --  7.7*  --   --   --   --   --   --  7.8*  --   --   MG  --   --  1.7   < > 1.8  --  1.7  --   --   --   --   --   --  2.0 1.9 1.8   < > = values in this interval not displayed.   GFR Estimated Creatinine Clearance: 141.2 mL/min (by C-G formula based on SCr of 0.86 mg/dL). Liver Function Tests: No results for input(s): AST, ALT, ALKPHOS, BILITOT, PROT, ALBUMIN in the last 168 hours. No results for input(s): LIPASE, AMYLASE in the last 168 hours. No results for input(s): AMMONIA in the last 168 hours. Coagulation profile No results for input(s): INR, PROTIME in the last 168 hours. COVID-19 Labs  No results for input(s): DDIMER, FERRITIN, LDH, CRP in the last 72 hours.  Lab Results  Component Value Date   SARSCOV2NAA NEGATIVE 09/24/2020   [place] NEGATIVE 09/09/2020   [place] Not Detected 12/05/2019    CBC: Recent Labs  Lab 09/23/20 0107 09/23/20 0107 09/24/20 0149 09/24/20 1501 09/24/20 1502 09/24/20 1509 09/25/20 0151  WBC 10.1  --  10.0  --   --   --  8.3  HGB 10.3*   < > 10.1* 10.9* 11.6* 10.5* 9.9*  HCT 31.1*   < > 31.1* 32.0* 34.0* 31.0* 30.6*  MCV 100.6*  --  101.6*  --   --   --  101.3*  PLT 279  --  262  --   --   --  251   < > = values in this interval not  displayed.   Cardiac Enzymes: No results for input(s): CKTOTAL, CKMB, CKMBINDEX, TROPONINI in the last 168 hours. BNP (last 3 results) No results for input(s): PROBNP in the last 8760 hours. CBG: Recent Labs  Lab 09/26/20 0615 09/26/20 1225 09/26/20 1651 09/26/20 2109 09/27/20 0618  GLUCAP 124* 185* 137* 94 116*   D-Dimer: No results for input(s): DDIMER in the last 72 hours. Hgb A1c: No results for input(s): HGBA1C in the last 72 hours. Lipid Profile: No results for input(s): CHOL, HDL, LDLCALC, TRIG, CHOLHDL, LDLDIRECT in the last 72 hours. Thyroid function studies: No results for input(s): TSH, T4TOTAL, T3FREE, THYROIDAB in the last 72 hours.  Invalid input(s): FREET3 Anemia work up: No results for input(s): VITAMINB12, FOLATE, FERRITIN, TIBC, IRON, RETICCTPCT in the last 72 hours. Sepsis Labs: Recent Labs  Lab 09/23/20 0107 09/24/20 0149 09/25/20 0151  WBC 10.1 10.0 8.3   Microbiology Recent Results (from the past 240 hour(s))  SARS Coronavirus 2 by RT PCR (hospital order, performed in [facility] hospital lab) Nasopharyngeal Nasopharyngeal Swab  Status: None   Collection Time: 09/24/20  1:10 AM   Specimen: Nasopharyngeal Swab  Result Value Ref Range Status   SARS Coronavirus 2 NEGATIVE NEGATIVE Final    Comment: (NOTE) SARS-CoV-2 target nucleic acids are NOT DETECTED.  The SARS-CoV-2 RNA is generally detectable in upper and lower respiratory specimens during the acute phase of infection. The lowest concentration of SARS-CoV-2 viral copies this assay can detect is 250 copies / mL. A negative result does not preclude SARS-CoV-2 infection and should not be used as the sole basis for treatment or other patient management decisions.  A negative result may occur with improper specimen collection / handling, submission of specimen other than nasopharyngeal swab, presence of viral mutation(s) within the areas targeted by this assay, and inadequate number of viral  copies (<250 copies / mL). A negative result must be combined with clinical observations, patient history, and epidemiological information.  Fact Sheet for Patients:   StrictlyIdeas.no  Fact Sheet for Healthcare Providers: BankingDealers.co.za  This test is not yet approved or  cleared by the [place] FDA and has been authorized for detection and/or diagnosis of SARS-CoV-2 by FDA under an Emergency Use Authorization (EUA).  This EUA will remain in effect (meaning this test can be used) for the duration of the COVID-19 declaration under Section 564(b)(1) of the Act, 21 U.S.C. section 360bbb-3(b)(1), unless the authorization is terminated or revoked sooner.  Performed at [facility], [place] [address] [place], [place] [address]      Medications:   . amiodarone  400 mg Oral Q12H   Followed by  . [START ON 10/07/2020] amiodarone  400 mg Oral Daily  . apixaban  5 mg Oral BID  . atorvastatin  80 mg Oral QHS  . carvedilol  80 mg Oral Daily  . Chlorhexidine Gluconate Cloth  6 each Topical Daily  . clopidogrel  75 mg Oral Q breakfast  . docusate sodium  100 mg Oral BID  . furosemide  40 mg Oral BID  . insulin aspart  0-20 Units Subcutaneous TID WC  . insulin aspart  0-5 Units Subcutaneous QHS  . insulin detemir  5 Units Subcutaneous BID  . levETIRAcetam  500 mg Oral BID  . magnesium oxide  200 mg Oral Daily  . pantoprazole  40 mg Oral Daily  . polyethylene glycol  17 g Oral Daily  . potassium chloride  10 mEq Oral Daily  . sacubitril-valsartan  1 tablet Oral BID  . sodium chloride flush  3 mL Intravenous Q12H   Continuous Infusions: . sodium chloride        LOS: 18 days   Charlynne Cousins  [facility]  09/27/2020, 10:05 AM

## 2020-09-27 NOTE — Progress Notes (Signed)
Heart Failure Stewardship Pharmacist Progress Note   PCP: Patient, No Pcp Per PCP-Cardiologist: Sanda Klein, MD    HPI:  59 YO male with PMH of CAD, chronic diastolic/systolic heart failure and ischemic cardiomyopathy, initially admitted for NVD and AKI, however, course of stay has been complicated by Afib, thrombocytopenia and sepsis.   Current HF Medications: Coreg 80 mg once daily, metoprolol 5 mg injection prn HR > 110, Entresto 97/103 mg BID, KCl 10 meq once daily, magnesium 200 mg once daily, furosemide 40 mg BID  Prior to admission HF Medications: Coreg CR 80 mg once daily, metoprolol succinate 25 mg once daily, Entresto 97/103 mg BID, potassium chloride 10 meq once daily  *Of note, also on hctz 25 mg once daily   Pertinent Lab Values:  Serum creatinine 0.86, BUN 9, Potassium 4.5, Sodium 137, BNP none availble, Magnesium 1.8  Vital Signs:  Weight: 308 lbs (admission weight: 285 lbs)  Blood pressure: 103/83  Heart rate: 79  Medication Assistance / Insurance Benefits Check: Does the patient have prescription insurance?  Yes Type of insurance plan: [employer]   Does the patient qualify for medication assistance through manufacturers or grants?   No  Eligible grants and/or patient assistance programs: N/A  Medication assistance applications in progress: N/A  Medication assistance applications approved: N/A Approved medication assistance renewals will be completed by: N/A  Outpatient Pharmacy:  Prior to admission outpatient pharmacy: [facility] [phone]) Is the patient willing to use [place] at discharge? Pending Is the patient willing to transition their outpatient pharmacy to utilize a [facility] outpatient pharmacy?   Pending    Assessment: 1. Chronic diastolic CHF (EF 44-31%). NYHA class II symptoms.   Currently reaching target ARNI dose; recommend continuing Entresto 97/103 BID  Home medication regimen includes hydrochlorothiazide, could  consider switching to an agent that provides heart failure benefits such as spironolactone (if blood pressure permits); can consider a daily or prn loop diuretic for volume overload needs if necessary  Home medication regimen includes dual beta blocker therapy, recommend keeping Coreg as it is reaching target dosing, and discontinuing the metoprolol upon discharge    Plan: 1) Medication changes recommended at this time: - Consider discontinuing metoprolol succinate at discharge - Consider discontinuing hydrochlorothiazide at discharge - Pending blood pressure, consider initiating spironolactone   2) Patient assistance application(s): -none to advise at this time   3)  Education  - To be completed prior to discharge  Harriet Pho, PharmD PGY-1 Community Pharmacy Resident   09/27/2020 11:50 AM   Kerby Nora, PharmD, BCPS Heart Failure Stewardship Pharmacist Phone [phone]

## 2020-09-28 DIAGNOSIS — R21 Rash and other nonspecific skin eruption: Secondary | ICD-10-CM

## 2020-09-28 LAB — GLUCOSE, CAPILLARY
Glucose-Capillary: 119 mg/dL — ABNORMAL HIGH (ref 70–99)
Glucose-Capillary: 146 mg/dL — ABNORMAL HIGH (ref 70–99)
Glucose-Capillary: 108 mg/dL — ABNORMAL HIGH (ref 70–99)
Glucose-Capillary: 183 mg/dL — ABNORMAL HIGH (ref 70–99)

## 2020-09-28 MED ORDER — FUROSEMIDE 40 MG PO TABS
40.0000 mg | ORAL_TABLET | Freq: Two times a day (BID) | ORAL | Status: DC
  Administered 2020-09-28 – 2020-09-29 (×3): 40 mg via ORAL
  Filled 2020-09-28 (×3): qty 1

## 2020-09-28 NOTE — Progress Notes (Signed)
Progress Note  Patient Name: Gregory [name] Date of Encounter: 09/28/2020  Primary Cardiologist: Sanda Klein, MD   Subjective   [facility] Dowell is a 59 y.o. 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 Sabra Heck.  Had new atrial fibrillation.  Initially seen 09/09/20.  DCCV Twice through course. LCP 10/11 notable for PCWP of 7, with patient Cors.  On Amiodarone through week of 09/24/20.    Overnight questions about lasix stop; clarified there was a question about drug rash.   Lasix and tramadol stopped in the setting of new rash- this has improved with the steroid cream.  Patient notes that he has never had problems with the lasix before.  Notes his mother has a similar rash in the past.  Inpatient Medications    Scheduled Meds: . amiodarone  400 mg Oral Q12H   Followed by  . [START ON 10/07/2020] amiodarone  400 mg Oral Daily  . apixaban  5 mg Oral BID  . atorvastatin  80 mg Oral QHS  . carvedilol  80 mg Oral Daily  . Chlorhexidine Gluconate Cloth  6 each Topical Daily  . clopidogrel  75 mg Oral Q breakfast  . docusate sodium  100 mg Oral BID  . insulin aspart  0-20 Units Subcutaneous TID WC  . insulin aspart  0-5 Units Subcutaneous QHS  . insulin detemir  5 Units Subcutaneous BID  . levETIRAcetam  500 mg Oral BID  . magnesium oxide  200 mg Oral Daily  . pantoprazole  40 mg Oral Daily  . polyethylene glycol  17 g Oral Daily  . potassium chloride  10 mEq Oral Daily  . sacubitril-valsartan  1 tablet Oral BID  . sodium chloride flush  3 mL Intravenous Q12H   Continuous Infusions: . sodium chloride     PRN Meds: sodium chloride, acetaminophen, acetaminophen, hydrocortisone cream, hydrOXYzine, metoprolol tartrate, morphine injection, ondansetron (ZOFRAN) IV, sodium chloride flush,  traZODone   Vital Signs    Vitals:   09/28/20 0021 09/28/20 0413 09/28/20 0610 09/28/20 0801  BP: 112/79 113/84  115/77  Pulse: 71 74  76  Resp: 12 13 14 15   Temp: 98.5 F (36.9 C) 97.9 F (36.6 C)  97.6 F (36.4 C)  TempSrc: Axillary Axillary  Oral  SpO2: 98% 99%  97%  Weight:   134.9 kg   Height:        Intake/Output Summary (Last 24 hours) at 09/28/2020 0924 Last data filed at 09/28/2020 8182 Gross per 24 hour  Intake --  Output 2475 ml  Net -2475 ml   Filed Weights   09/25/20 0644 09/26/20 0437 09/28/20 0610  Weight: (!) 137.4 kg (!) 139.7 kg 134.9 kg    Telemetry    SR with PVCs, No VT - Personally Reviewed  ECG    No new - Personally Reviewed  Physical Exam   GEN: No acute distress.   Neck: No JVD Cardiac: RRR, no murmur, no rubs, or gallops.  Respiratory: CTAB GI: Soft, nontender, non-distended  MS: No edema; No deformity. Neuro:  Nonfocal  Skin: Improved in small erythematous rash on the left lumbar region, non-painful Psych: Normal affect   Labs    Chemistry Recent Labs  Lab 09/23/20 0107 09/23/20 0107 09/24/20 0149 09/24/20 1501 09/24/20 1502  09/24/20 1509 09/25/20 0151  NA 134*   < > 136   < > 138 139 137  K 3.8   < > 3.7   < > 4.2 4.0 4.5  CL 96*  --  97*  --   --   --  101  CO2 32  --  31  --   --   --  30  GLUCOSE 191*  --  190*  --   --   --  159*  BUN 12  --  9  --   --   --  9  CREATININE 0.85  --  0.88  --   --   --  0.86  CALCIUM 7.7*  --  7.7*  --   --   --  7.8*  GFRNONAA >60  --  >60  --   --   --  >60  ANIONGAP 6  --  8  --   --   --  6   < > = values in this interval not displayed.     Hematology Recent Labs  Lab 09/23/20 0107 09/23/20 0107 09/24/20 0149 09/24/20 1501 09/24/20 1502 09/24/20 1509 09/25/20 0151  WBC 10.1  --  10.0  --   --   --  8.3  RBC 3.09*  --  3.06*  --   --   --  3.02*  HGB 10.3*   < > 10.1*   < > 11.6* 10.5* 9.9*  HCT 31.1*   < > 31.1*   < > 34.0* 31.0* 30.6*  MCV 100.6*  --  101.6*   --   --   --  101.3*  MCH 33.3  --  33.0  --   --   --  32.8  MCHC 33.1  --  32.5  --   --   --  32.4  RDW 17.5*  --  17.2*  --   --   --  17.0*  PLT 279  --  262  --   --   --  251   < > = values in this interval not displayed.   Magnesium  Date Value Ref Range Status  09/27/2020 1.8 1.7 - 2.4 mg/dL Final    Comment:    Performed at [facility], [place] [address], [place], [place] [address]   Radiology    No results found.  Cardiac Studies   IMPRESSIONS    1. Left ventricular ejection fraction, by estimation, is 40 to 45%. The  left ventricle has mildly decreased function. The left ventricle  demonstrates regional wall motion abnormalities (see scoring  diagram/findings for description). Left ventricular  diastolic parameters are consistent with Grade I diastolic dysfunction  (impaired relaxation).   2. Right ventricular systolic function was not well visualized. The right  ventricular size is normal.   3. The mitral valve is grossly normal. No evidence of mitral valve  regurgitation.   4. The aortic valve was not well visualized.   5. The inferior vena cava is normal in size with greater than 50%  respiratory variability, suggesting right atrial pressure of 3 mmHg.   Comparison(s): Slight decrease in LV function with different caliber  (non-contrasted) study. Slight decrease in global LVEF.   Patient Profile     59 y.o. male PAF, HFrEV, VT, and CAD who presented after sepsis with pronged course  Assessment & Plan    VT s/p ICD - Keep K+ >/= 4, Mg >/=2, on Mag Ox,  needs Mg level in 1 week - amio 400 mg bid >> 400 mg qd on 10/20 >> 200 mg qd on 10/30  - continue Coreg CR 80 mg qd (home dose) and Eliquis    CAD and HRmrEF  - no ischemic sx when working w/ rehab despite CTO RCA - continue Plavix and Eliquis - on BB and entresto and high-dose Lipitor - continue atorvastatin - return lasix 40 mg PO BID; BMP in one week post DC  PAF S/p DCCV this admission -  on AC, amiodarone, and BB as above -presently in SR  Rash:  - possible drug related  Pending outpatient rehab For questions or updates, please contact [facility] Please consult [email or web address] for contact info under Cardiology/STEMI.      Signed, Werner Lean, MD  09/28/2020, 9:24 AM

## 2020-09-28 NOTE — Progress Notes (Signed)
[facility] PROGRESS NOTE    Progress Note  Gregory [name]  GUR:[id number] DOB: 1961-01-14 DOA: 09/09/2020 PCP: Gregory [name], No Pcp Per     Brief Narrative:   Gregory [name] is an 59 y.o. male past medical history of coronary artery disease, chronic diastolic heart failure, ischemic cardiomyopathy essential hypertension status post cardiac defibrillation who initially presented to the ED with nausea vomiting and diarrhea was found to be in acute kidney injury requiring vasopressors initially admitted to the ICU infectious work-up turned out to be negative.  He then developed A. 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 EP was consulted and recommended DCCV on 09/19/2020, started on amio and tried again now in [place]. Gregory [name] did develop acute confusion, leukocytosis and was suspected to be in sepsis due to UTI started empirically on antibiotics.  09/28/2020: Gregory [name] seen.  Gregory [name] is awaiting disposition.  No new complaints.  Gregory [name] continues to improve.  Encephalopathy has resolved significantly.  Assessment/Plan:   Ventricular fibrillation status post AICD and CAD/A. fib with RVR CHADS Vasc score greater than 4. Had a burst of fast heart rate, continue amiodarone 400 loading dose until 10/03/2020. Cath on 09/24/2020 no new obstructive disease, will continue Eliquis. Social worker is working try to get him approved to inpatient rehab. Continue aspirin and Eliquis. Keep potassium greater than 4 magnesium greater than 2. Awaiting for insurance approval for inpatient rehab admission.  Persistent atrial fibrillation with RVR: With a chads Vasc score greater than 4, he is status post 2 cardioversions currently in sinus rhythm. To new amiodarone load on 11/23/2020 He will have to go home on  Eliquis and amiodarone 400 mg p.o. daily.  Acute confusional state/acute encephalopathy: Likely due to infectious etiology CT scan unremarkable unable to do MRI due to AICD. His mental status has returned to baseline he is neurologically intact.  Severe sepsis due to UTI: Started empirically on IV Rocephin on admission is in the cephalopathy resolved. He completed his course of IV antibiotics in house. Culture data has remained negative till date.  Acute on chronic diastolic heart failure: Appears euvolemic, change to oral Lasix. He is diuresing well now about 3 L at half negative. Continue Entresto and Coreg.  Acute kidney injury: Likely prerenal in the setting of A. fib with RVR with a baseline less than 1. Resolved with IV fluid hydration.    Thrombocytopenia: After starting heparin head bodies were negative there is no evidence of bleeding after stopping heparin his platelets are improving.  Hypocalcemia: Was giving calcium gluconate now resolved.  Coronary artery disease: Status post AICD, denies any chest pain or shortness of breath.  Macrocytic anemia: Folate of 300, B12 was 683. Iron of 27 ferritin of 1000.  Chronic combined systolic and diastolic heart failure (HCC) Appears euvolemic on physical exam KVO IV fluids last echo showed an EF of 55%. Further management per cards. On a stable dose of diuretics.  Obstructive sleep apnea: Continue CPAP at night.  Mild transaminitis: Abdominal ultrasound was negative for acute cholecystitis he denies any abdominal pain. Likely cardiovascular mediated now resolved.  Newly diagnosed diabetes mellitus type 2: A1c 6.9, continue long-acting insulin plus sliding scale.  Seizures: Continue Keppra.   DVT prophylaxis: Eliquis Family Communication:none Status is: Inpatient  Remains inpatient appropriate because:Hemodynamically unstable   Dispo: The Gregory [name] is from: Home  Anticipated d/c is to: CIR               Anticipated d/c date is: 2 days              Gregory [name] currently Gregory [name] is medically stable transferred to CIR.  Awaiting CIR recommendations.        Code Status:     Code Status Orders  (From admission, onward)         Start     Ordered   09/09/20 1642  Full code  Continuous        09/09/20 1643        Code Status History    Date Active Date Inactive Code Status Order ID Comments User Context   06/07/2018 2227 06/09/2018 1730 Full Code [id number]  Jani Gravel, MD Inpatient   12/19/2016 1651 12/19/2016 2113 Full Code [id number]  [name], MD Inpatient   05/04/2016 2100 05/07/2016 2158 Full Code [id number]  Jerline Pain, MD Inpatient   Advance Care Planning Activity        IV Access:    Peripheral IV   Procedures and diagnostic studies:   No results found.   Medical Consultants:    None.  Anti-Infectives:   keflex  Subjective:    Gregory [name] no compalins  Objective:    Vitals:   09/28/20 0610 09/28/20 0801 09/28/20 1128 09/28/20 1558  BP:  115/77 102/65 107/74  Pulse:  76 70 68  Resp: 14 15 15 14   Temp:  97.6 F (36.4 C) 97.8 F (36.6 C) 97.6 F (36.4 C)  TempSrc:  Oral Oral Oral  SpO2:  97% 100% 100%  Weight: 134.9 kg     Height:       SpO2: 100 % O2 Flow Rate (L/min): 2 L/min   Intake/Output Summary (Last 24 hours) at 09/28/2020 1640 Last data filed at 09/28/2020 1037 Gross per 24 hour  Intake --  Output 1700 ml  Net -1700 ml   Filed Weights   09/25/20 0644 09/26/20 0437 09/28/20 0610  Weight: (!) 137.4 kg (!) 139.7 kg 134.9 kg    Exam: General exam: In no acute distress. Respiratory system: Good air movement and clear to auscultation. Cardiovascular system: S1 & S2 heard, RRR. No JVD. Gastrointestinal system: Abdomen is nondistended, soft and nontender.  Extremities: 1+ to 2 bilateral leg edema. Neuro: Awake and alert.  Gregory [name] moves all extremities.  Data Reviewed:    Labs: Basic Metabolic  Panel: Recent Labs  Lab 09/22/20 0637 09/22/20 0637 09/22/20 1137 09/22/20 1137 09/23/20 0107 09/23/20 0107 09/24/20 0149 09/24/20 0149 09/24/20 1501 09/24/20 1501 09/24/20 1502 09/24/20 1502 09/24/20 1509 09/25/20 0151 09/26/20 0811 09/27/20 0038  NA 137   < > 136   < > 134*   < > 136  --  139  --  138  --  139 137  --   --   K 4.3   < > 4.3   < > 3.8   < > 3.7   < > 4.1   < > 4.2   < > 4.0 4.5  --   --   CL 98  --  97*  --  96*  --  97*  --   --   --   --   --   --  101  --   --   CO2 29  --  28  --  32  --  31  --   --   --   --   --   --  30  --   --   GLUCOSE 148*  --  199*  --  191*  --  190*  --   --   --   --   --   --  159*  --   --   BUN 12  --  12  --  12  --  9  --   --   --   --   --   --  9  --   --   CREATININE 0.89  --  0.99  --  0.85  --  0.88  --   --   --   --   --   --  0.86  --   --   CALCIUM 7.8*  --  7.8*  --  7.7*  --  7.7*  --   --   --   --   --   --  7.8*  --   --   MG  --   --  1.7   < > 1.8  --  1.7  --   --   --   --   --   --  2.0 1.9 1.8   < > = values in this interval not displayed.   GFR Estimated Creatinine Clearance: 138.8 mL/min (by C-G formula based on SCr of 0.86 mg/dL). Liver Function Tests: No results for input(s): AST, ALT, ALKPHOS, BILITOT, PROT, ALBUMIN in the last 168 hours. No results for input(s): LIPASE, AMYLASE in the last 168 hours. No results for input(s): AMMONIA in the last 168 hours. Coagulation profile No results for input(s): INR, PROTIME in the last 168 hours. COVID-19 Labs  No results for input(s): DDIMER, FERRITIN, LDH, CRP in the last 72 hours.  Lab Results  Component Value Date   SARSCOV2NAA NEGATIVE 09/24/2020   [place] NEGATIVE 09/09/2020   [place] Not Detected 12/05/2019    CBC: Recent Labs  Lab 09/23/20 0107 09/23/20 0107 09/24/20 0149 09/24/20 1501 09/24/20 1502 09/24/20 1509 09/25/20 0151  WBC 10.1  --  10.0  --   --   --  8.3  HGB 10.3*   < > 10.1* 10.9* 11.6* 10.5* 9.9*  HCT 31.1*    < > 31.1* 32.0* 34.0* 31.0* 30.6*  MCV 100.6*  --  101.6*  --   --   --  101.3*  PLT 279  --  262  --   --   --  251   < > = values in this interval not displayed.   Cardiac Enzymes: No results for input(s): CKTOTAL, CKMB, CKMBINDEX, TROPONINI in the last 168 hours. BNP (last 3 results) No results for input(s): PROBNP in the last 8760 hours. CBG: Recent Labs  Lab 09/27/20 1149 09/27/20 1642 09/27/20 2157 09/28/20 0606 09/28/20 1126  GLUCAP 149* 97 129* 119* 146*   D-Dimer: No results for input(s): DDIMER in the last 72 hours. Hgb A1c: No results for input(s): HGBA1C in the last 72 hours. Lipid Profile: No results for input(s): CHOL, HDL, LDLCALC, TRIG, CHOLHDL, LDLDIRECT in the last 72 hours. Thyroid function studies: No results for input(s): TSH, T4TOTAL, T3FREE, THYROIDAB in the last 72 hours.  Invalid input(s): FREET3 Anemia work up: No results for input(s): VITAMINB12, FOLATE, FERRITIN, TIBC, IRON, RETICCTPCT in the last 72 hours. Sepsis Labs: Recent Labs  Lab 09/23/20 0107 09/24/20 0149 09/25/20 0151  WBC 10.1 10.0 8.3   Microbiology Recent Results (from the past 240 hour(s))  SARS Coronavirus 2 by RT  PCR (hospital order, performed in [facility] hospital lab) Nasopharyngeal Nasopharyngeal Swab     Status: None   Collection Time: 09/24/20  1:10 AM   Specimen: Nasopharyngeal Swab  Result Value Ref Range Status   SARS Coronavirus 2 NEGATIVE NEGATIVE Final    Comment: (NOTE) SARS-CoV-2 target nucleic acids are NOT DETECTED.  The SARS-CoV-2 RNA is generally detectable in upper and lower respiratory specimens during the acute phase of infection. The lowest concentration of SARS-CoV-2 viral copies this assay can detect is 250 copies / mL. A negative result does not preclude SARS-CoV-2 infection and should not be used as the sole basis for treatment or other Gregory [name] management decisions.  A negative result may occur with improper specimen collection / handling,  submission of specimen other than nasopharyngeal swab, presence of viral mutation(s) within the areas targeted by this assay, and inadequate number of viral copies (<250 copies / mL). A negative result must be combined with clinical observations, Gregory [name] history, and epidemiological information.  Fact Sheet for Patients:   StrictlyIdeas.no  Fact Sheet for Healthcare Providers: BankingDealers.co.za  This test is not yet approved or  cleared by the [place] FDA and has been authorized for detection and/or diagnosis of SARS-CoV-2 by FDA under an Emergency Use Authorization (EUA).  This EUA will remain in effect (meaning this test can be used) for the duration of the COVID-19 declaration under Section 564(b)(1) of the Act, 21 U.S.C. section 360bbb-3(b)(1), unless the authorization is terminated or revoked sooner.  Performed at [facility], [place] [address] [place], [place] [address]      Medications:   . amiodarone  400 mg Oral Q12H   Followed by  . [START ON 10/07/2020] amiodarone  400 mg Oral Daily  . apixaban  5 mg Oral BID  . atorvastatin  80 mg Oral QHS  . carvedilol  80 mg Oral Daily  . Chlorhexidine Gluconate Cloth  6 each Topical Daily  . clopidogrel  75 mg Oral Q breakfast  . docusate sodium  100 mg Oral BID  . furosemide  40 mg Oral BID  . insulin aspart  0-20 Units Subcutaneous TID WC  . insulin aspart  0-5 Units Subcutaneous QHS  . insulin detemir  5 Units Subcutaneous BID  . levETIRAcetam  500 mg Oral BID  . magnesium oxide  200 mg Oral Daily  . pantoprazole  40 mg Oral Daily  . polyethylene glycol  17 g Oral Daily  . potassium chloride  10 mEq Oral Daily  . sacubitril-valsartan  1 tablet Oral BID  . sodium chloride flush  3 mL Intravenous Q12H   Continuous Infusions: . sodium chloride     Time spent 25 minutes.   LOS: 19 days   Gregory [name]  [facility]  09/28/2020, 4:40 PM

## 2020-09-28 NOTE — Progress Notes (Signed)
Physical Therapy Treatment Patient Details Name: Calyb Mcquarrie MRN: [id number] DOB: 01-Jan-1961 Today's Date: 09/28/2020    History of Present Illness Darran Azim Gillingham is an 59 y.o. male past medical history of coronary artery disease, chronic diastolic heart failure, ischemic cardiomyopathy essential hypertension status post cardiac defibrillation who initially presented to the ED with nausea vomiting and diarrhea was found to be in acute kidney injury requiring vasopressors initially admitted to the ICU infectious work-up turned out to be negative.  He then developed A. fib with RVR cardiology was consulted.Procedure  DC Cardioversion 10/6.    PT Comments    Pt supine in bed on arrival.  Pt required assistance to mobilize into standing and more receptive to rise into standing without holding on RW.  Continue to recommend CIR to progress functional mobility and improve strength before returning home.     Follow Up Recommendations  CIR     Equipment Recommendations  Rolling walker with 5" wheels;3in1 (PT)    Recommendations for Other Services       Precautions / Restrictions Precautions Precautions: Fall Restrictions Weight Bearing Restrictions: No    Mobility  Bed Mobility Overal bed mobility: Modified Independent Bed Mobility: Supine to Sit;Sit to Supine     Supine to sit: Modified independent (Device/Increase time);HOB elevated Sit to supine: Modified independent (Device/Increase time);HOB elevated   General bed mobility comments: No assistance for mobility.  Transfers Overall transfer level: Needs assistance Equipment used: Rolling walker (2 wheeled) Transfers: Sit to/from Stand Sit to Stand: Min assist         General transfer comment: Min assistance to rise into standing from elevated bed.  Continues to pull on RW for support.  He did perform additional transfer and followed commands to push into standing.  Ambulation/Gait Ambulation/Gait  assistance: Min guard Gait Distance (Feet): 100 Feet Assistive device: Rolling walker (2 wheeled) Gait Pattern/deviations: Step-through pattern;Decreased stride length;Trunk flexed Gait velocity: decr   General Gait Details: Cues for scap retraction and upper trunk control.  Pt less anxious about his HR today.  Cues for safety with RW and for turns and backing in tight spaces.   Stairs             Wheelchair Mobility    Modified Rankin (Stroke Patients Only)       Balance Overall balance assessment: Needs assistance Sitting-balance support: No upper extremity supported;Feet supported Sitting balance-Leahy Scale: Good     Standing balance support: Bilateral upper extremity supported Standing balance-Leahy Scale: Poor                              Cognition Arousal/Alertness: Awake/alert Behavior During Therapy: Anxious (less anxious this session.) Overall Cognitive Status: Within Functional Limits for tasks assessed                                 General Comments: Pt very fearful of HR going up and getting shocked again.       Exercises      General Comments        Pertinent Vitals/Pain Pain Assessment: No/denies pain Faces Pain Scale: Hurts a little bit Pain Location: back Pain Descriptors / Indicators: Aching Pain Intervention(s): Monitored during session;Repositioned    Home Living                      Prior Function  PT Goals (current goals can now be found in the care plan section) Acute Rehab PT Goals Patient Stated Goal: "I hope I go to rehab today." Potential to Achieve Goals: Good Progress towards PT goals: Progressing toward goals    Frequency    Min 3X/week      PT Plan Current plan remains appropriate    Co-evaluation              AM-PAC PT "6 Clicks" Mobility   Outcome Measure  Help needed turning from your back to your side while in a flat bed without using bedrails?:  None Help needed moving from lying on your back to sitting on the side of a flat bed without using bedrails?: None Help needed moving to and from a bed to a chair (including a wheelchair)?: A Little Help needed standing up from a chair using your arms (e.g., wheelchair or bedside chair)?: A Little Help needed to walk in hospital room?: A Little Help needed climbing 3-5 steps with a railing? : A Lot 6 Click Score: 19    End of Session Equipment Utilized During Treatment: Gait belt Activity Tolerance: Patient limited by fatigue Patient left: in bed;with call bell/phone within reach;with bed alarm set Nurse Communication: Mobility status PT Visit Diagnosis: Other abnormalities of gait and mobility (R26.89);Muscle weakness (generalized) (M62.81)     Time: 3491-7915 PT Time Calculation (min) (ACUTE ONLY): 21 min  Charges:  $Gait Training: 8-22 mins                     Erasmo Leventhal , PTA Acute Rehabilitation Services Pager [phone] Office [phone]     Carolin Quang Eli Hose 09/28/2020, 10:54 AM

## 2020-09-28 NOTE — Telephone Encounter (Signed)
Left a message on number provided with my cell# for CB to discuss.   Legrand Como [address]" [place], PA-C  09/28/2020 12:47 PM

## 2020-09-28 NOTE — Progress Notes (Signed)
Occupational Therapy Treatment Patient Details Name: Gregory [name] MRN: [id number] DOB: [birth year] Today's Date: 09/28/2020    History of present illness Kendell Kavish [place] is an 59 y.o. male past medical history of coronary artery disease, chronic diastolic heart failure, ischemic cardiomyopathy essential hypertension status post cardiac defibrillation who initially presented to the ED with nausea vomiting and diarrhea was found to be in acute kidney injury requiring vasopressors initially admitted to the ICU infectious work-up turned out to be negative.  He then developed A. fib with RVR cardiology was consulted.Procedure  DC Cardioversion 10/6.   OT comments  Patient with good participation this date: sit/stand ADL, grooming and toilet task at RW level.  Patient continues to present with decreased dynamic stand balance, generalized weakness, decreased activity tolerance with O2 dependency.  These barriers continue to impact ADL and mobility independence in the acute setting.  CIR has been recommended, and is advised, in order to assist him regain his independence, with the goal of returning home at his prior level of function.  The patient requires 24 hour physical assist, which is a significant decline from his prior level. Acute OT will continue to follow, but he needs to transition to the next level in order to progress.    Follow Up Recommendations  CIR    Equipment Recommendations  Tub/shower seat    Recommendations for Other Services Rehab consult    Precautions / Restrictions Precautions Precautions: Fall Restrictions Weight Bearing Restrictions: No       Mobility Bed Mobility Overal bed mobility: Modified Independent Bed Mobility: Supine to Sit;Sit to Supine     Supine to sit: Modified independent (Device/Increase time);HOB elevated Sit to supine: Modified independent (Device/Increase time);HOB elevated   General bed mobility comments: No assistance  for mobility.  Transfers Overall transfer level: Needs assistance Equipment used: Rolling walker (2 wheeled) Transfers: Sit to/from Stand Sit to Stand: Mod assist Stand pivot transfers: Min guard       General transfer comment: Min assistance to rise into standing from elevated bed.  Continues to pull on RW for support.  He did perform additional transfer and followed commands to push into standing.    Balance Overall balance assessment: Needs assistance Sitting-balance support: No upper extremity supported;Feet supported Sitting balance-Leahy Scale: Good     Standing balance support: Bilateral upper extremity supported Standing balance-Leahy Scale: Poor                             ADL either performed or assessed with clinical judgement   ADL       Grooming: Set up;Wash/dry face;Oral care;Wash/dry hands;Sitting   Upper Body Bathing: Set up;Sitting   Lower Body Bathing: Minimal assistance;Sitting/lateral leans   Upper Body Dressing : Set up;Sitting   Lower Body Dressing: Moderate assistance;Sit to/from stand   Toilet Transfer: Min guard;RW   Toileting- Water quality scientist and Hygiene: Maximal assistance;Sit to/from stand       Functional mobility during ADLs: Min guard;Rolling walker                         Cognition Arousal/Alertness: Awake/alert Behavior During Therapy: Anxious (less anxious this session.) Overall Cognitive Status: Within Functional Limits for tasks assessed  General Comments O2 sats 92% on 1 L via [place].  HR 78-88 during sink sdie ADL.    Pertinent Vitals/ Pain       Pain Assessment: No/denies pain Faces Pain Scale: Hurts a little bit Pain Location: back Pain Descriptors / Indicators: Aching Pain Intervention(s): Monitored during session                                                          Frequency  Min  2X/week        Progress Toward Goals  OT Goals(current goals can now be found in the care plan section)  Progress towards OT goals: Progressing toward goals  Acute Rehab OT Goals Patient Stated Goal: I need to get stronger OT Goal Formulation: With patient Time For Goal Achievement: 10/03/20 Potential to Achieve Goals: Good  Plan Discharge plan remains appropriate    Co-evaluation                 AM-PAC OT "6 Clicks" Daily Activity     Outcome Measure   Help from another person eating meals?: None Help from another person taking care of personal grooming?: None Help from another person toileting, which includes using toliet, bedpan, or urinal?: A Lot Help from another person bathing (including washing, rinsing, drying)?: A Little Help from another person to put on and taking off regular upper body clothing?: A Little Help from another person to put on and taking off regular lower body clothing?: A Lot 6 Click Score: 18    End of Session Equipment Utilized During Treatment: Rolling walker  OT Visit Diagnosis: Unsteadiness on feet (R26.81);Other abnormalities of gait and mobility (R26.89);Muscle weakness (generalized) (M62.81);Pain   Activity Tolerance Patient tolerated treatment well   Patient Left in bed;with call bell/phone within reach   Nurse Communication Other (comment) (completed ADL)        Time: 6468-0321 OT Time Calculation (min): 22 min  Charges: OT General Charges $OT Visit: 1 Visit OT Treatments $Self Care/Home Management : 8-22 mins  09/28/2020  Rich, OTR/L  Acute Rehabilitation Services  Office:  [phone]    Metta Clines 09/28/2020, 12:59 PM

## 2020-09-28 NOTE — Progress Notes (Signed)
IP rehab admissions - Continue to await call back from insurance carrier for potential acute inpatient rehab admission.  Will follow up once we hear back from insurance case manager.  Call for questions.  [phone]

## 2020-09-28 NOTE — Telephone Encounter (Signed)
Discussed with Dr. Geryl Councilman. They received request for Eliquis 5 mg BID "For life" as the only sig.   Confirmed that this would be Eliquis 5 mg BID, 60 tabs (or 3 month supply, patient preference) with a year worth of refills.   With plans for CIR, will ask them to resubmit when he is discharged.   Legrand Como [address]" [place], PA-C  09/28/2020 2:20 PM

## 2020-09-29 ENCOUNTER — Encounter (HOSPITAL_COMMUNITY): Payer: Self-pay | Admitting: Physical Medicine & Rehabilitation

## 2020-09-29 ENCOUNTER — Other Ambulatory Visit: Payer: Self-pay

## 2020-09-29 ENCOUNTER — Encounter (HOSPITAL_COMMUNITY): Payer: Self-pay | Admitting: Internal Medicine

## 2020-09-29 ENCOUNTER — Inpatient Hospital Stay (HOSPITAL_COMMUNITY)
Admission: EM | Admit: 2020-09-29 | Discharge: 2020-10-10 | DRG: 945 | Disposition: A | Discharge status: Home / Self Care | Payer: Worker's Compensation | Source: Intra-hospital | Attending: Physical Medicine & Rehabilitation | Admitting: Physical Medicine & Rehabilitation

## 2020-09-29 DIAGNOSIS — I480 Paroxysmal atrial fibrillation: Secondary | ICD-10-CM | POA: Diagnosis present

## 2020-09-29 DIAGNOSIS — Z6832 Body mass index (BMI) 32.0-32.9, adult: Secondary | ICD-10-CM | POA: Diagnosis not present

## 2020-09-29 DIAGNOSIS — I255 Ischemic cardiomyopathy: Secondary | ICD-10-CM | POA: Diagnosis present

## 2020-09-29 DIAGNOSIS — D179 Benign lipomatous neoplasm, unspecified: Secondary | ICD-10-CM

## 2020-09-29 DIAGNOSIS — Z79899 Other long term (current) drug therapy: Secondary | ICD-10-CM

## 2020-09-29 DIAGNOSIS — K760 Fatty (change of) liver, not elsewhere classified: Secondary | ICD-10-CM | POA: Diagnosis present

## 2020-09-29 DIAGNOSIS — I110 Hypertensive heart disease with heart failure: Secondary | ICD-10-CM | POA: Diagnosis present

## 2020-09-29 DIAGNOSIS — D1779 Benign lipomatous neoplasm of other sites: Secondary | ICD-10-CM | POA: Diagnosis not present

## 2020-09-29 DIAGNOSIS — E785 Hyperlipidemia, unspecified: Secondary | ICD-10-CM | POA: Diagnosis present

## 2020-09-29 DIAGNOSIS — N179 Acute kidney failure, unspecified: Secondary | ICD-10-CM | POA: Diagnosis present

## 2020-09-29 DIAGNOSIS — K5901 Slow transit constipation: Secondary | ICD-10-CM | POA: Diagnosis present

## 2020-09-29 DIAGNOSIS — D170 Benign lipomatous neoplasm of skin and subcutaneous tissue of head, face and neck: Secondary | ICD-10-CM | POA: Diagnosis present

## 2020-09-29 DIAGNOSIS — I5043 Acute on chronic combined systolic (congestive) and diastolic (congestive) heart failure: Secondary | ICD-10-CM | POA: Diagnosis present

## 2020-09-29 DIAGNOSIS — E8809 Other disorders of plasma-protein metabolism, not elsewhere classified: Secondary | ICD-10-CM

## 2020-09-29 DIAGNOSIS — I4891 Unspecified atrial fibrillation: Secondary | ICD-10-CM | POA: Diagnosis present

## 2020-09-29 DIAGNOSIS — G479 Sleep disorder, unspecified: Secondary | ICD-10-CM | POA: Diagnosis not present

## 2020-09-29 DIAGNOSIS — Z95810 Presence of automatic (implantable) cardiac defibrillator: Secondary | ICD-10-CM

## 2020-09-29 DIAGNOSIS — Z7982 Long term (current) use of aspirin: Secondary | ICD-10-CM

## 2020-09-29 DIAGNOSIS — Z87891 Personal history of nicotine dependence: Secondary | ICD-10-CM

## 2020-09-29 DIAGNOSIS — R790 Abnormal level of blood mineral: Secondary | ICD-10-CM

## 2020-09-29 DIAGNOSIS — E119 Type 2 diabetes mellitus without complications: Secondary | ICD-10-CM | POA: Diagnosis present

## 2020-09-29 DIAGNOSIS — R5381 Other malaise: Principal | ICD-10-CM | POA: Diagnosis present

## 2020-09-29 DIAGNOSIS — R569 Unspecified convulsions: Secondary | ICD-10-CM

## 2020-09-29 DIAGNOSIS — R7309 Other abnormal glucose: Secondary | ICD-10-CM

## 2020-09-29 DIAGNOSIS — E46 Unspecified protein-calorie malnutrition: Secondary | ICD-10-CM | POA: Diagnosis present

## 2020-09-29 DIAGNOSIS — N529 Male erectile dysfunction, unspecified: Secondary | ICD-10-CM | POA: Diagnosis present

## 2020-09-29 DIAGNOSIS — G40909 Epilepsy, unspecified, not intractable, without status epilepticus: Secondary | ICD-10-CM | POA: Diagnosis present

## 2020-09-29 DIAGNOSIS — I2510 Atherosclerotic heart disease of native coronary artery without angina pectoris: Secondary | ICD-10-CM | POA: Diagnosis present

## 2020-09-29 DIAGNOSIS — Z8249 Family history of ischemic heart disease and other diseases of the circulatory system: Secondary | ICD-10-CM

## 2020-09-29 DIAGNOSIS — D62 Acute posthemorrhagic anemia: Secondary | ICD-10-CM | POA: Diagnosis present

## 2020-09-29 DIAGNOSIS — G4730 Sleep apnea, unspecified: Secondary | ICD-10-CM | POA: Diagnosis present

## 2020-09-29 DIAGNOSIS — Z7902 Long term (current) use of antithrombotics/antiplatelets: Secondary | ICD-10-CM

## 2020-09-29 LAB — GLUCOSE, CAPILLARY
Glucose-Capillary: 122 mg/dL — ABNORMAL HIGH (ref 70–99)
Glucose-Capillary: 133 mg/dL — ABNORMAL HIGH (ref 70–99)
Glucose-Capillary: 137 mg/dL — ABNORMAL HIGH (ref 70–99)

## 2020-09-29 MED ORDER — OXYCODONE HCL 5 MG PO TABS
5.0000 mg | ORAL_TABLET | Freq: Four times a day (QID) | ORAL | Status: DC | PRN
  Administered 2020-09-29 – 2020-10-09 (×21): 5 mg via ORAL
  Filled 2020-09-29 (×21): qty 1

## 2020-09-29 MED ORDER — CARVEDILOL PHOSPHATE ER 20 MG PO CP24
80.0000 mg | ORAL_CAPSULE | Freq: Every day | ORAL | Status: DC
  Administered 2020-09-30 – 2020-10-10 (×11): 80 mg via ORAL
  Filled 2020-09-29: qty 1
  Filled 2020-09-29 (×11): qty 4

## 2020-09-29 MED ORDER — APIXABAN 5 MG PO TABS
5.0000 mg | ORAL_TABLET | Freq: Two times a day (BID) | ORAL | Status: DC
  Administered 2020-09-29 – 2020-10-10 (×22): 5 mg via ORAL
  Filled 2020-09-29 (×22): qty 1

## 2020-09-29 MED ORDER — AMIODARONE HCL 200 MG PO TABS
400.0000 mg | ORAL_TABLET | Freq: Every day | ORAL | Status: DC
  Administered 2020-10-03 – 2020-10-10 (×8): 400 mg via ORAL
  Filled 2020-09-29 (×8): qty 2

## 2020-09-29 MED ORDER — LEVETIRACETAM 500 MG PO TABS
500.0000 mg | ORAL_TABLET | Freq: Two times a day (BID) | ORAL | Status: DC
  Administered 2020-09-29 – 2020-10-10 (×22): 500 mg via ORAL
  Filled 2020-09-29 (×22): qty 1

## 2020-09-29 MED ORDER — PANTOPRAZOLE SODIUM 40 MG PO TBEC
40.0000 mg | DELAYED_RELEASE_TABLET | Freq: Every day | ORAL | Status: DC
  Administered 2020-09-30 – 2020-10-10 (×11): 40 mg via ORAL
  Filled 2020-09-29 (×11): qty 1

## 2020-09-29 MED ORDER — INSULIN ASPART 100 UNIT/ML ~~LOC~~ SOLN
0.0000 [IU] | Freq: Three times a day (TID) | SUBCUTANEOUS | 11 refills | Status: DC
Start: 2020-09-29 — End: 2020-10-10

## 2020-09-29 MED ORDER — POTASSIUM CHLORIDE CRYS ER 10 MEQ PO TBCR
10.0000 meq | EXTENDED_RELEASE_TABLET | Freq: Every day | ORAL | Status: DC
  Administered 2020-09-30 – 2020-10-10 (×11): 10 meq via ORAL
  Filled 2020-09-29 (×11): qty 1

## 2020-09-29 MED ORDER — ATORVASTATIN CALCIUM 80 MG PO TABS
80.0000 mg | ORAL_TABLET | Freq: Every day | ORAL | Status: DC
  Administered 2020-09-29 – 2020-10-09 (×11): 80 mg via ORAL
  Filled 2020-09-29 (×11): qty 1

## 2020-09-29 MED ORDER — AMIODARONE HCL 200 MG PO TABS
400.0000 mg | ORAL_TABLET | Freq: Two times a day (BID) | ORAL | Status: AC
  Administered 2020-09-29 – 2020-10-02 (×7): 400 mg via ORAL
  Filled 2020-09-29 (×7): qty 2

## 2020-09-29 MED ORDER — CLOPIDOGREL BISULFATE 75 MG PO TABS
75.0000 mg | ORAL_TABLET | Freq: Every day | ORAL | Status: DC
  Administered 2020-09-30 – 2020-10-10 (×11): 75 mg via ORAL
  Filled 2020-09-29 (×11): qty 1

## 2020-09-29 MED ORDER — INSULIN DETEMIR 100 UNIT/ML ~~LOC~~ SOLN: 5.0000 [IU] | Freq: Two times a day (BID) | SUBCUTANEOUS | 11 refills | Status: DC

## 2020-09-29 MED ORDER — FUROSEMIDE 40 MG PO TABS
40.0000 mg | ORAL_TABLET | Freq: Two times a day (BID) | ORAL | 0 refills | Status: DC
Start: 2020-09-29 — End: 2020-10-10

## 2020-09-29 MED ORDER — POLYETHYLENE GLYCOL 3350 17 G PO PACK
17.0000 g | PACK | Freq: Every day | ORAL | Status: DC
  Administered 2020-10-01 – 2020-10-10 (×7): 17 g via ORAL
  Filled 2020-09-29 (×10): qty 1

## 2020-09-29 MED ORDER — AMIODARONE HCL 400 MG PO TABS: ORAL_TABLET | ORAL | 0 refills | Status: DC

## 2020-09-29 MED ORDER — ACETAMINOPHEN 325 MG PO TABS: 650.0000 mg | ORAL_TABLET | ORAL | Status: DC | PRN

## 2020-09-29 MED ORDER — INSULIN DETEMIR 100 UNIT/ML ~~LOC~~ SOLN
5.0000 [IU] | Freq: Two times a day (BID) | SUBCUTANEOUS | Status: DC
  Administered 2020-09-29 – 2020-10-02 (×6): 5 [IU] via SUBCUTANEOUS
  Filled 2020-09-29 (×7): qty 0.05

## 2020-09-29 MED ORDER — MAGNESIUM OXIDE 400 (241.3 MG) MG PO TABS
200.0000 mg | ORAL_TABLET | Freq: Every day | ORAL | 0 refills | Status: DC
Start: 2020-09-30 — End: 2020-10-10

## 2020-09-29 MED ORDER — DOCUSATE SODIUM 100 MG PO CAPS
100.0000 mg | ORAL_CAPSULE | Freq: Two times a day (BID) | ORAL | Status: DC
  Administered 2020-09-29 – 2020-10-10 (×22): 100 mg via ORAL
  Filled 2020-09-29 (×22): qty 1

## 2020-09-29 MED ORDER — TRAZODONE HCL 50 MG PO TABS
100.0000 mg | ORAL_TABLET | Freq: Every evening | ORAL | Status: DC | PRN
  Administered 2020-09-29 – 2020-10-04 (×6): 100 mg via ORAL
  Filled 2020-09-29 (×6): qty 2

## 2020-09-29 MED ORDER — DOCUSATE SODIUM 100 MG PO CAPS
100.0000 mg | ORAL_CAPSULE | Freq: Two times a day (BID) | ORAL | 0 refills | Status: DC
Start: 2020-09-29 — End: 2021-05-09

## 2020-09-29 MED ORDER — HYDROCORTISONE 1 % EX CREA
1.0000 "application " | TOPICAL_CREAM | Freq: Three times a day (TID) | CUTANEOUS | Status: DC | PRN
  Administered 2020-09-29 – 2020-10-01 (×2): 1 via TOPICAL
  Filled 2020-09-29 (×5): qty 28

## 2020-09-29 MED ORDER — INSULIN ASPART 100 UNIT/ML ~~LOC~~ SOLN
0.0000 [IU] | Freq: Three times a day (TID) | SUBCUTANEOUS | Status: DC
  Administered 2020-09-29 – 2020-10-10 (×9): 3 [IU] via SUBCUTANEOUS

## 2020-09-29 MED ORDER — INSULIN ASPART 100 UNIT/ML ~~LOC~~ SOLN
0.0000 [IU] | Freq: Every day | SUBCUTANEOUS | 11 refills | Status: DC
Start: 2020-09-29 — End: 2020-10-10

## 2020-09-29 MED ORDER — MAGNESIUM OXIDE 400 (241.3 MG) MG PO TABS
200.0000 mg | ORAL_TABLET | Freq: Every day | ORAL | Status: DC
  Administered 2020-09-30 – 2020-10-10 (×11): 200 mg via ORAL
  Filled 2020-09-29 (×11): qty 1

## 2020-09-29 MED ORDER — APIXABAN 5 MG PO TABS
5.0000 mg | ORAL_TABLET | Freq: Two times a day (BID) | ORAL | 0 refills | Status: DC
Start: 2020-09-29 — End: 2020-10-10

## 2020-09-29 MED ORDER — SACUBITRIL-VALSARTAN 97-103 MG PO TABS
1.0000 | ORAL_TABLET | Freq: Two times a day (BID) | ORAL | Status: DC
  Administered 2020-09-29 – 2020-10-10 (×22): 1 via ORAL
  Filled 2020-09-29 (×24): qty 1

## 2020-09-29 MED ORDER — HYDROXYZINE HCL 10 MG PO TABS
10.0000 mg | ORAL_TABLET | Freq: Three times a day (TID) | ORAL | Status: DC | PRN
  Administered 2020-09-29 – 2020-10-07 (×8): 10 mg via ORAL
  Filled 2020-09-29 (×10): qty 1

## 2020-09-29 NOTE — TOC Transition Note (Signed)
Transition of Care [facility]) - CM/SW Discharge Note   Patient Details  Name: Gregory [name] MRN: [id number] Date of Birth: 30-Jan-1961  Transition of Care [name] CM/SW Contact:  Zenon Mayo, RN Phone Number: 09/29/2020, 9:46 AM   Clinical Narrative:    Patient for dc to CIR today,per CIR coordinator.   Final next level of care: IP Rehab Facility Barriers to Discharge: No Barriers Identified   Patient Goals and CMS Choice Patient states their goals for this hospitalization and ongoing recovery are:: rehab then return home      Discharge Placement                       Discharge Plan and Services   Discharge Planning Services: CM Consult, Medication Assistance Post Acute Care Choice: IP Rehab                               Social Determinants of Health (SDOH) Interventions     Readmission Risk Interventions No flowsheet data found.

## 2020-09-29 NOTE — Progress Notes (Signed)
Report given at 12:55 to RN at inpatient rehab.

## 2020-09-29 NOTE — Progress Notes (Signed)
Signed     PMR Admission Coordinator Pre-Admission Assessment   Patient: Gregory [name] is an 59 y.o., male MRN: [id number] DOB: 09-07-1961 Height: '6\' 6"'  (198.1 cm) Weight: 135.7 kg   Insurance Information HMO:     PPO:      PCP:      IPA:      80/20:      OTHER:  PRIMARY: Worker's Comp      Policy#: [id number]      Subscriber:  CM Name: Gregory [name]   /adjuster   Phone#: [phone]   Fax#: [phone] Pre-Cert#: case  [id number].  Authorization #[id number].  Authorization given for 12 days.  If requires longer than 12 days, updates are requested on day 7.   Employer: [employer] Benefits:  Phone #:      Name:  Eff. Date:      Deduct:       Out of Pocket Max:       Life Max:  CIR:       SNF:  Outpatient:      Co-Pay:  Home Health:       Co-Pay:  DME:      Co-Pay: Providers:  SECONDARY:       Policy#:      Phone#:    Development worker, community:       Phone#:    The Therapist, art Information Summary" for patients in Inpatient Rehabilitation Facilities with attached "Privacy Act [place] Records" was provided and verbally reviewed with: N/[name]   Emergency Contact Information         Contact Information     Name Relation Home Work Mobile    [place] Mother [phone]        Gregory [name],Gregory [name] Brother [phone]        Gregory [name], Gregory [name] Daughter     [phone]    Gregory [name], Gregory [name] Son     [phone]         Current Medical History  Patient Admitting Diagnosis: debility 2/2 AFIB with RVR   History of Present Illness: Gregory [name] is [name] 59 year old right-handed male with history of hyperlipidemia, seizure disorder maintained on Keppra, CAD, diastolic congestive heart failure maintained on Entresto, ischemic cardiomyopathy, VT status post AICD maintained on Plavix, quit smoking 7 years ago.  Per chart review lives with his son who works from home.  Multilevel home 10 steps to entry.  Reportedly independent prior to admission and active.  Presented 09/09/2020  with generalized weakness and dizziness in the setting of multiple days of diarrhea.  Denied any nausea vomiting or abdominal pain.  Ultrasound the abdomen showed cholelithiasis without evidence of acute cholecystitis and fatty liver.  Chest x-ray negative.  Admission chemistry sodium 130, potassium 3.1, chloride 95, glucose 189, BUN 55, creatinine 5.60, calcium 5.2, AST 88, lactic acid 3.2, troponin 48, magnesium 1.6, urine culture no growth.  Echocardiogram with ejection fraction of 55 to 60% no wall motion abnormalities.  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  Cardiac catheterization completed 09/24/2020 per Dr. Alvester Gregory [name] for follow-up with results pending.  Patient did experience some altered mental status cranial CT scan completed which was reviewed by neurology services no acute process noted.  He was placed empirically on IV Rocephin for question UTI latest cultures negative.  Follow-up cardiology services  for acute on chronic diastolic congestive heart failure and patient has undergone diuresis.  Patient with persistent elevations in glucose 186-237 and hemoglobin A1c 6.9 currently maintained on sliding scale insulin as well as scheduled Levemir 5 units twice daily.  Patient remains on [place] for history of seizure disorder.  AKI likely prerenal in the setting of [name]. fib with RVR placed on gentle IV fluids and latest creatinine 0.86.  Tolerating [name] regular diet.    Patient's medical record from [facility] has been reviewed by the rehabilitation admission coordinator and physician.   Past Medical History      Past Medical History:  Diagnosis Date  . CAD (coronary artery disease) 12/01/2013  . Chronic  combined systolic and diastolic CHF, NYHA class 1 ([place]) 12/01/2013  . Erectile dysfunction 12/01/2013  . HTN (hypertension) 12/01/2013  . Hyperlipidemia 12/01/2013  . Ischemic cardiomyopathy 12/01/2013  . Sleep apnea        Family History   family history includes Brain cancer in his father; Heart disease in his mother; Hypertension in his daughter.   Prior Rehab/Hospitalizations Has the patient had prior rehab or hospitalizations prior to admission? No   Has the patient had major surgery during 100 days prior to admission? Yes              Current Medications   Current Facility-Administered Medications:  .  0.9 %  sodium chloride infusion, 250 mL, Intravenous, PRN, Gregory Harp, Gregory [name] .  acetaminophen (TYLENOL) tablet 650 mg, 650 mg, Oral, Q4H PRN, Gregory Harp, Gregory [name], 650 mg at 09/28/20 1332 .  acetaminophen (TYLENOL) tablet 650 mg, 650 mg, Oral, Q4H PRN, Gregory Harp, Gregory [name] .  amiodarone (PACERONE) tablet 400 mg, 400 mg, Oral, Q12H, 400 mg at 09/29/20 0942 **FOLLOWED BY** [START ON 10/07/2020] amiodarone (PACERONE) tablet 400 mg, 400 mg, Oral, Daily, Gregory [name], Gregory [name], Gregory [name] .  apixaban (ELIQUIS) tablet 5 mg, 5 mg, Oral, BID, Gregory [name], Gregory [name], Gregory [name], 5 mg at 09/29/20 0941 .  atorvastatin (LIPITOR) tablet 80 mg, 80 mg, Oral, QHS, Gregory Harp, Gregory [name], 80 mg at 09/28/20 2200 .  carvedilol (COREG CR) 24 hr capsule 80 mg, 80 mg, Oral, Daily, Gregory Harp, Gregory [name], 80 mg at 09/29/20 0938 .  Chlorhexidine Gluconate Cloth 2 % PADS 6 each, 6 each, Topical, Daily, Gregory Harp, Gregory [name], 6 each at 09/26/20 1107 .  clopidogrel (PLAVIX) tablet 75 mg, 75 mg, Oral, Q breakfast, Gregory Harp, Gregory [name], 75 mg at 09/29/20 0942 .  docusate sodium (COLACE) capsule 100 mg, 100 mg, Oral, BID, Gregory Harp, Gregory [name], 100 mg at 09/29/20 0940 .  furosemide (LASIX) tablet 40 mg, 40 mg, Oral, BID, Gregory [name], Gregory [name], Gregory [name], 40 mg at 09/29/20 0940 .  hydrocortisone cream 1 % 1 application, 1  application, Topical, TID PRN, Gregory Cousins, Gregory [name], 1 application at 09/38/18 0955 .  hydrOXYzine (ATARAX/VISTARIL) tablet 10 mg, 10 mg, Oral, TID PRN, Gregory Cousins, Gregory [name], 10 mg at 09/29/20 0939 .  insulin aspart (novoLOG) injection 0-20 Units, 0-20 Units, Subcutaneous, TID WC, Gregory Harp, Gregory [name], 3 Units at 09/28/20 1137 .  insulin aspart (novoLOG) injection 0-5 Units, 0-5 Units, Subcutaneous, QHS, Gregory Harp, Gregory [name], 2 Units at 09/24/20 2224 .  insulin detemir (LEVEMIR) injection 5 Units, 5 Units, Subcutaneous, BID, Gregory Harp, Gregory [name], 5 Units at 09/29/20 0945 .  levETIRAcetam (KEPPRA) tablet 500 mg, 500 mg, Oral, BID, Gregory Harp, Gregory [name], 500 mg at 09/29/20 0941 .  magnesium oxide (MAG-OX) tablet 200 mg, 200 mg, Oral, Daily, Barrett, Rhonda G, PA-C, 200 mg at 09/29/20 0941 .  metoprolol tartrate (LOPRESSOR) injection 5 mg, 5 mg, Intravenous, Q4H PRN, Gregory Harp, Gregory [name], 5 mg at 09/16/20 1115 .  morphine 2 MG/ML injection 2 mg, 2 mg, Intravenous, Q1H PRN, Gregory Harp, Gregory [name], 2 mg at 09/28/20 1837 .  ondansetron (ZOFRAN) injection 4 mg, 4 mg, Intravenous, Q6H PRN, Gregory Harp, Gregory [name] .  pantoprazole (PROTONIX) EC tablet 40 mg, 40 mg, Oral, Daily, Gregory Harp, Gregory [name], 40 mg at 09/29/20 0943 .  polyethylene glycol (MIRALAX / GLYCOLAX) packet 17 g, 17 g, Oral, Daily, Gregory Harp, Gregory [name], 17 g at 09/29/20 0940 .  potassium chloride (KLOR-CON) CR tablet 10 mEq, 10 mEq, Oral, Daily, Gregory Harp, Gregory [name], 10 mEq at 09/29/20 0940 .  sacubitril-valsartan (ENTRESTO) 97-103 mg per tablet, 1 tablet, Oral, BID, Gregory Harp, Gregory [name], 1 tablet at 09/29/20 0940 .  sodium chloride flush (NS) 0.9 % injection 3 mL, 3 mL, Intravenous, Q12H, Gregory Harp, Gregory [name], 3 mL at 09/28/20 2206 .  sodium chloride flush (NS) 0.9 % injection 3 mL, 3 mL, Intravenous, PRN, Gregory Harp, Gregory [name] .  traZODone (DESYREL) tablet 100 mg, 100 mg, Oral, QHS PRN, Gregory Harp, Gregory [name], 100 mg at 09/28/20  2202   Patients Current Diet:     Diet Order                      Diet regular Room service appropriate? Yes; Fluid consistency: Thin  Diet effective now                      Precautions / Restrictions Precautions Precautions: Fall Restrictions Weight Bearing Restrictions: No    Has the patient had 2 or more falls or [name] fall with injury in the past year? No   Prior Activity Level Community (5-7x/wk): was going to the gym 5 days/week, driving, no dme   Prior Functional Level Self Care: Did the patient need help bathing, dressing, using the toilet or eating? Independent   Indoor Mobility: Did the patient need assistance with walking from room to room (with or without device)? Independent   Stairs: Did the patient need assistance with internal or external stairs (with or without device)? Independent   Functional Cognition: Did the patient need help planning regular tasks such as shopping or remembering to take medications? Independent   Home Assistive Devices / Equipment Home Assistive Devices/Equipment: None Home Equipment: None   Prior Device Use: Indicate devices/aids used by the patient prior to current illness, exacerbation or injury? None of the above   Current Functional Level Cognition   Overall Cognitive Status: Within Functional Limits for tasks assessed Orientation Level: Oriented X4 General Comments: Pt very fearful of HR going up and getting shocked again.     Extremity Assessment (includes Sensation/Coordination)   Upper Extremity Assessment: Overall WFL for tasks assessed  Lower Extremity Assessment: Defer to PT evaluation LLE Sensation:  (Pt. reports nuimbness in LE. he states it is because of edem)     ADLs   Overall ADL's : Needs assistance/impaired Eating/Feeding: Independent, Sitting Grooming: Set up, Wash/dry face, Oral care, Wash/dry hands, Sitting Upper Body Bathing: Set up, Sitting Lower Body Bathing: Minimal assistance, Sitting/lateral  leans Lower Body Bathing Details (indicate cue type and reason): long sitting.  Patient is not able to let go of the RW for LB ADL  Upper Body Dressing : Set up, Sitting Lower Body Dressing: Moderate assistance, Sit to/from stand Toilet Transfer: Min guard, RW Toileting- Clothing Manipulation and Hygiene: Maximal assistance, Sit to/from stand Functional mobility during ADLs: Min guard, Rolling walker General ADL Comments: Patient had just returned to bed and declined back to recliner.  Able to sit EOB.  Mod [name]     Mobility   Overal bed mobility: Modified Independent Bed Mobility: Supine to Sit, Sit to Supine Rolling: Min assist Supine to sit: Modified independent (Device/Increase time), HOB elevated Sit to supine: Modified independent (Device/Increase time), HOB elevated General bed mobility comments: No assistance for mobility.     Transfers   Overall transfer level: Needs assistance Equipment used: Rolling walker (2 wheeled) Transfers: Sit to/from Stand Sit to Stand: Mod assist Stand pivot transfers: Min guard General transfer comment: Min assistance to rise into standing from elevated bed.  Continues to pull on RW for support.  He did perform additional transfer and followed commands to push into standing.     Ambulation / Gait / Stairs / Wheelchair Mobility   Ambulation/Gait Ambulation/Gait assistance: Counsellor (Feet): 100 Feet Assistive device: Rolling walker (2 wheeled) Gait Pattern/deviations: Step-through pattern, Decreased stride length, Trunk flexed General Gait Details: Cues for scap retraction and upper trunk control.  Pt less anxious about his HR today.  Cues for safety with RW and for turns and backing in tight spaces. Gait velocity: decr Gait velocity interpretation: <1.8 ft/sec, indicate of risk for recurrent falls     Posture / Balance Balance Overall balance assessment: Needs assistance Sitting-balance support: No upper extremity supported, Feet  supported Sitting balance-Leahy Scale: Good Standing balance support: Bilateral upper extremity supported Standing balance-Leahy Scale: Poor Standing balance comment: walker and supervision     Special needs/care consideration Diabetic management yes  AICD    Previous Home Environment (from acute therapy documentation) Living Arrangements: Children Available Help at Discharge: Family, Available 24 hours/day Type of Home: House Home Layout: Multi-level Alternate Level Stairs-Rails: Right Alternate Level Stairs-Number of Steps: 10 Home Access: Level entry Bathroom Shower/Tub: Chiropodist: Standard Home Care Services: No   Discharge Living Setting Plans for Discharge Living Setting: Lives with (comment) (son) Type of Home at Discharge: House Discharge Home Layout: Multi-level (split level) Alternate Level Stairs-Rails: Right Alternate Level Stairs-Number of Steps: 10 Discharge Home Access: Level entry Discharge Bathroom Shower/Tub: Tub/shower unit Discharge Bathroom Toilet: Standard Discharge Bathroom Accessibility: Yes How Accessible: Accessible via walker Does the patient have any problems obtaining your medications?: No   Social/Family/Support Systems Anticipated Caregiver: Wendie Chess IV, son Anticipated Ambulance person Information: [phone] Ability/Limitations of Caregiver: works during the day from home Caregiver Availability: 24/7 Discharge Plan Discussed with Primary Caregiver: Yes (per patient) Is Caregiver In Agreement with Plan?: Yes Does Caregiver/Family have Issues with Lodging/Transportation while Pt is in Rehab?: No   Goals Patient/Family Goal for Rehab: PT/OT mod I, SLP n/[name] Expected length of stay: 9-12 days Additional Information: worker's comp info: claim [id number], DOI 05/29/04, Gregory [name] [phone] Pt/Family Agrees to Admission and willing to participate: Yes Program Orientation Provided & Reviewed with  Pt/Caregiver Including Roles  & Responsibilities: Yes   Decrease burden of Care through IP rehab admission: n/[name]   Possible need for SNF placement upon discharge: not anticipated   Patient Condition: I have reviewed medical records from [facility], spoken with CM, and patient. I met with patient at the bedside for inpatient rehabilitation assessment.  Patient will benefit  from ongoing PT and OT, can actively participate in 3 hours of therapy [name] day 5 days of the week, and can make measurable gains during the admission.  Patient will also benefit from the coordinated team approach during an Inpatient Acute Rehabilitation admission.  The patient will receive intensive therapy as well as Rehabilitation physician, nursing, social worker, and care management interventions.  Due to safety, skin/wound care, disease management, medication administration, pain management and patient education the patient requires 24 hour [name] day rehabilitation nursing.  The patient is currently Min [name]-Mod I with mobility and setup-Mod [name] with basic ADLs.  Discharge setting and therapy post discharge at home with home health is anticipated.  Patient has agreed to participate in the Acute Inpatient Rehabilitation Program and will admit today.   Preadmission Screen Completed By: Shann Medal, PT, DPT with updates by   Bethel Born, 09/29/2020 10:12 AM ______________________________________________________________________   Discussed status with Dr. Letta Pate on 09/29/20  at 10:12 AM and received approval for admission today.   Admission Coordinator: Shann Medal, PT, DPT ;; Bethel Born, CCC-SLP, time 10:12 AM/Date 09/29/20     Assessment/Plan: Diagnosis:  Debility following acute renal failure 1. Does the need for close, 24 hr/day Medical supervision in concert with the patient's rehab needs make it unreasonable for this patient to be served in [name] less intensive setting? Yes 2. Co-Morbidities  requiring supervision/potential complications: Atrial and ventricular arrythmias, s/p AICD, CHF, hx seizure d/o 3. Due to bladder management, bowel management, safety, skin/wound care, disease management, medication administration, pain management and patient education, does the patient require 24 hr/day rehab nursing? Yes 4. Does the patient require coordinated care of [name] physician, rehab nurse, PT, OT,  to address physical and functional deficits in the context of the above medical diagnosis(es)? Yes Addressing deficits in the following areas: balance, endurance, locomotion, strength, transferring, bowel/bladder control, bathing, dressing, feeding, grooming, toileting and psychosocial support 5. Can the patient actively participate in an intensive therapy program of at least 3 hrs of therapy 5 days [name] week? Yes 6. The potential for patient to make measurable gains while on inpatient rehab is good 7. Anticipated functional outcomes upon discharge from inpatient rehab: modified independent and supervision PT, modified independent and supervision OT, n/[name] SLP 8. Estimated rehab length of stay to reach the above functional goals is: 9-12d 9. Anticipated discharge destination: Home 10. Overall Rehab/Functional Prognosis: good     Gregory [name] Signature: Charlett Blake M.D. [facility] FAAPM&R (Neuromuscular Med) Diplomate Am Board of Electrodiagnostic Med Fellow Am Board of Interventional Pain

## 2020-09-29 NOTE — Progress Notes (Signed)
Inpatient Rehabilitation Medication Review by a Pharmacist  A complete drug regimen review was completed for this patient to identify any potential clinically significant medication issues.  Clinically significant medication issues were identified:  yes   Type of Medication Issue Identified Description of Issue Urgent (address now) Non-Urgent (address on AM team rounds) Plan Plan Accepted by Provider? (Yes / No / Pending AM Rounds)  Drug Interaction(s) (clinically significant)       Duplicate Therapy       Allergy       No Medication Administration End Date       Incorrect Dose       Additional Drug Therapy Needed  Lasix 40 mg BID not restarted yet Non-urgent Restart Lasix 40 mg BID Secured chat Dr. Letta Pate - pending  Other  Current Amiodarone order for 400 mg BID then 400 mg daily starting 10/28  Non-urgent  Per Dr. Margaretann Loveless (cardiology) note on 10/16, Amiodarone 400 mg BID until 10/03/20, then 400 mg daily, then 200 mg daily starting on 10/13/20 Secured chat Dr. Letta Pate - pending    Name of provider notified for urgent issues identified: Dr. Letta Pate   Provider Method of Notification: Secure chat   For non-urgent medication issues to be resolved on team rounds tomorrow morning a [facility] [place] was sent to: handoff updated with medication issues identified for follow-up   Pharmacist comments: Pharmacist handoff updated with medication issues identified for follow-up  Time spent performing this drug regimen review (minutes):  15  Lorel Monaco, PharmD PGY2 [place]

## 2020-09-29 NOTE — H&P (Signed)
Physical Medicine and Rehabilitation Admission H&P       Chief Complaint  Patient presents with  . Tachycardia  . Hypotension  : HPI: Gregory [name] is a 59 year old right-handed male with history of hyperlipidemia, seizure disorder maintained on Keppra, CAD, diastolic congestive heart failure maintained on Entresto, ischemic cardiomyopathy, VT status post AICD maintained on Plavix, quit smoking 7 years ago.  Per chart review lives with his son who works from home.  Multilevel home 10 steps to entry.  Reportedly independent prior to admission and active.  Presented 09/09/2020 with generalized weakness and dizziness in the setting of multiple days of diarrhea.  Denied any nausea vomiting or abdominal pain.  Ultrasound the abdomen showed cholelithiasis without evidence of acute cholecystitis and fatty liver.  Chest x-ray negative.  Admission chemistry sodium 130, potassium 3.1, chloride 95, glucose 189, BUN 55, creatinine 5.60, calcium 5.2, AST 88, lactic acid 3.2, troponin 48, magnesium 1.6, urine culture no growth.  Echocardiogram with ejection fraction of 55 to 60% no wall motion abnormalities.  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  Cardiac catheterization completed 09/24/2020 per Dr. Alvester Chou for follow-up showed no obstructive disease.  Patient did experience some altered mental status cranial CT scan completed which was reviewed by neurology services no acute process noted.  He was placed empirically on IV Rocephin for question UTI latest cultures negative.  Follow-up cardiology services for acute on chronic diastolic congestive heart failure and patient has undergone diuresis.  Patient with persistent  elevations in glucose 186-237 and hemoglobin A1c 6.9 currently maintained on sliding scale insulin as well as scheduled Levemir 5 units twice daily.  Patient remains on Makena for history of seizure disorder.  AKI likely prerenal in the setting of A. fib with RVR placed on gentle IV fluids and latest creatinine 0.86.  Tolerating a regular diet.  Therapy evaluations completed and patient was admitted for a comprehensive rehab program.  Review of Systems  Constitutional: Positive for malaise/fatigue. Negative for chills and fever.  HENT: Negative for hearing loss.   Eyes: Negative for blurred vision and double vision.  Respiratory: Positive for shortness of breath. Negative for cough.   Cardiovascular: Positive for palpitations and leg swelling. Negative for chest pain.  Gastrointestinal: Positive for constipation. Negative for heartburn, nausea and vomiting.  Genitourinary: Positive for urgency. Negative for dysuria, flank pain and hematuria.  Musculoskeletal: Positive for myalgias.  Skin: Negative for rash.  Neurological: Positive for seizures.  All other systems reviewed and are negative.      Past Medical History:  Diagnosis Date  . CAD (coronary artery disease) 12/01/2013  . Chronic combined systolic and diastolic CHF, NYHA class 1 ([place]) 12/01/2013  . Erectile dysfunction 12/01/2013  . HTN (hypertension) 12/01/2013  . Hyperlipidemia 12/01/2013  . Ischemic cardiomyopathy 12/01/2013  . Sleep apnea         Past Surgical History:  Procedure Laterality Date  . ACHILLES TENDON REPAIR     lft foot  . CARDIAC CATHETERIZATION N/A 05/05/2016   Procedure: Left Heart Cath and Coronary Angiography;  Surgeon: Leonie Man, MD;  Location: [facility];  Service: Cardiovascular;  Laterality: N/A;  . CARDIAC DEFIBRILLATOR PLACEMENT    . CARDIOVERSION N/A 09/13/2020   Procedure: CARDIOVERSION;  Surgeon: Rudean Haskell  A, MD;  Location: [facility];  Service:  Cardiovascular;  Laterality: N/A;  . EP IMPLANTABLE DEVICE N/A 12/19/2016   Procedure: ICD Generator Changeout;  Surgeon: [name], MD;  Location: [facility];  Service: Cardiovascular;  Laterality: N/A;  . WRIST TENODESIS          Family History  Problem Relation Age of Onset  . Heart disease Mother   . Brain cancer Father   . Hypertension Daughter    Social History:  reports that he quit smoking about 7 years ago. His smoking use included cigarettes. He smoked 0.00 packs per day for 30.00 years. He has never used smokeless tobacco. He reports that he does not drink alcohol and does not use drugs. Allergies: No Known Allergies       Medications Prior to Admission  Medication Sig Dispense Refill  . aspirin 81 MG tablet Take 81 mg by mouth daily.    Marland Kitchen atorvastatin (LIPITOR) 80 MG tablet TAKE ONE TABLET BY MOUTH EVERY EVENING (Patient taking differently: Take 80 mg by mouth daily. ) 90 tablet 3  . clopidogrel (PLAVIX) 75 MG tablet TAKE 1 TABLET(75 MG) BY MOUTH DAILY (Patient taking differently: Take 75 mg by mouth daily. ) 90 tablet 2  . COREG CR 80 MG 24 hr capsule Take 1 capsule (80 mg total) by mouth daily. 90 capsule 3  . ENTRESTO 97-103 MG TAKE 1 TABLET BY MOUTH TWICE DAILY (Patient taking differently: Take 1 tablet by mouth 2 (two) times daily. ) 60 tablet 10  . FEROSUL 325 (65 Fe) MG tablet TAKE 1 TABLET BY MOUTH DAILY WITH BREAKFAST (Patient taking differently: Take 325 mg by mouth daily with breakfast. ) 60 tablet 1  . hydrochlorothiazide (HYDRODIURIL) 25 MG tablet TAKE 1 TABLET(25 MG) BY MOUTH DAILY (Patient taking differently: Take 25 mg by mouth daily. ) 90 tablet 1  . levETIRAcetam (KEPPRA) 500 MG tablet TAKE 1 TABLET(500 MG) BY MOUTH TWICE DAILY (Patient taking differently: Take 500 mg by mouth 2 (two) times daily. ) 60 tablet 11  . metoprolol succinate (TOPROL XL) 25 MG 24 hr tablet Take 1 tablet (25 mg total) by mouth daily. 90 tablet 3  . pantoprazole  (PROTONIX) 40 MG tablet TAKE 1 TABLET(40 MG) BY MOUTH DAILY (Patient taking differently: Take 40 mg by mouth daily. ) 30 tablet 6  . polyethylene glycol (MIRALAX / GLYCOLAX) packet Take 17 g by mouth daily as needed for moderate constipation or severe constipation. 14 each 1  . potassium chloride (KLOR-CON) 10 MEQ tablet TAKE 1 TABLET BY MOUTH EVERY DAY (Patient taking differently: Take 10 mEq by mouth daily. ) 90 tablet 3  . sildenafil (VIAGRA) 50 MG tablet Take 1 tablet (50 mg total) by mouth as needed for erectile dysfunction. 30 tablet 4    Drug Regimen Review Drug regimen was reviewed and remains appropriate with no significant issues identified  Home: Home Living Family/patient expects to be discharged to:: Private residence Living Arrangements: Children Available Help at Discharge: Family, Available 24 hours/day Type of Home: House Home Access: Level entry Home Layout: Multi-level Alternate Level Stairs-Number of Steps: 10 Alternate Level Stairs-Rails: Right Bathroom Shower/Tub: Chiropodist: Standard Home Equipment: None   Functional History: Prior Function Level of Independence: Independent Comments: pt reports independent and driving, no assist with anything.  Functional Status:  Mobility: Bed Mobility Overal bed mobility: Needs Assistance Bed Mobility: Rolling, Supine to Sit Rolling: Min assist Supine to sit: Mod assist, HOB  elevated Transfers Overall transfer level: Needs assistance Equipment used: Rolling walker (2 wheeled) Transfers: Sit to/from Stand, [employer] Transfers Sit to Stand: Mod assist, From elevated surface Stand pivot transfers: Mod assist, From elevated surface General transfer comment: declined stand and to recliner  ADL: ADL Overall ADL's : Needs assistance/impaired Eating/Feeding: Independent, Sitting Grooming: Set up, Wash/dry face, Oral care, Wash/dry hands, Sitting Upper Body Bathing: Set up, Sitting Lower  Body Bathing: Moderate assistance, Bed level Lower Body Bathing Details (indicate cue type and reason): long sitting.  Patient is not able to let go of the RW for LB ADL Upper Body Dressing : Set up, Sitting Lower Body Dressing: Moderate assistance, Sitting/lateral leans Functional mobility during ADLs: Moderate assistance General ADL Comments: Patient had just returned to bed and declined back to recliner.  Able to sit EOB.  Mod A  Cognition: Cognition Overall Cognitive Status: Within Functional Limits for tasks assessed Orientation Level: Oriented X4 Cognition Arousal/Alertness: Awake/alert Behavior During Therapy: WFL for tasks assessed/performed Overall Cognitive Status: Within Functional Limits for tasks assessed  Physical Exam: Blood pressure 113/74, pulse 85, temperature (!) 97.5 F (36.4 C), temperature source Oral, resp. rate 15, height 6\' 6"  (1.981 m), weight (!) 139.8 kg, SpO2 93 %. Physical Exam Neurological:     Comments: Patient is alert. Mood is a bit flat but appropriate. Makes eye contact with examiner. Follows simple commands. Provides his name and age.    General: No acute distress Mood and affect are appropriate Heart: Regular rate and rhythm no rubs murmurs or extra sounds Lungs: Clear to auscultation, breathing unlabored, no rales or wheezes Abdomen: Positive bowel sounds, soft nontender to palpation, nondistended Extremities: No clubbing, cyanosis, or edema Skin: No evidence of breakdown, no evidence of rash Neurologic: Cranial nerves II through XII intact, motor strength is 4/5 in bilateral deltoid, bicep, tricep, grip,3- bilateral  hip flexor, knee extensors, ankle dorsiflexor and plantar flexor Sensory exam normal sensation to light touch and proprioception in bilateral upper and lower extremities  Musculoskeletal: no pain with active ROM in all 4 ext. No joint swelling   Lab Results Last 48 Hours        Results for orders placed or performed during  the hospital encounter of 09/09/20 (from the past 48 hour(s))  Glucose, capillary     Status: Abnormal   Collection Time: 09/19/20 11:38 AM  Result Value Ref Range   Glucose-Capillary 171 (H) 70 - 99 mg/dL    Comment: Glucose reference range applies only to samples taken after fasting for at least 8 hours.  Glucose, capillary     Status: Abnormal   Collection Time: 09/19/20  4:19 PM  Result Value Ref Range   Glucose-Capillary 236 (H) 70 - 99 mg/dL    Comment: Glucose reference range applies only to samples taken after fasting for at least 8 hours.  Glucose, capillary     Status: Abnormal   Collection Time: 09/19/20  8:15 PM  Result Value Ref Range   Glucose-Capillary 237 (H) 70 - 99 mg/dL    Comment: Glucose reference range applies only to samples taken after fasting for at least 8 hours.  Glucose, capillary     Status: Abnormal   Collection Time: 09/20/20  6:12 AM  Result Value Ref Range   Glucose-Capillary 186 (H) 70 - 99 mg/dL    Comment: Glucose reference range applies only to samples taken after fasting for at least 8 hours.  Glucose, capillary     Status: Abnormal  Collection Time: 09/20/20 11:12 AM  Result Value Ref Range   Glucose-Capillary 202 (H) 70 - 99 mg/dL    Comment: Glucose reference range applies only to samples taken after fasting for at least 8 hours.  Glucose, capillary     Status: Abnormal   Collection Time: 09/20/20  4:26 PM  Result Value Ref Range   Glucose-Capillary 228 (H) 70 - 99 mg/dL    Comment: Glucose reference range applies only to samples taken after fasting for at least 8 hours.  Glucose, capillary     Status: Abnormal   Collection Time: 09/20/20 10:15 PM  Result Value Ref Range   Glucose-Capillary 227 (H) 70 - 99 mg/dL    Comment: Glucose reference range applies only to samples taken after fasting for at least 8 hours.   Comment 1 Notify RN    Comment 2 Document in Chart   Basic metabolic panel     Status:  Abnormal   Collection Time: 09/21/20  4:45 AM  Result Value Ref Range   Sodium 136 135 - 145 mmol/L   Potassium 4.4 3.5 - 5.1 mmol/L   Chloride 99 98 - 111 mmol/L   CO2 28 22 - 32 mmol/L   Glucose, Bld 195 (H) 70 - 99 mg/dL    Comment: Glucose reference range applies only to samples taken after fasting for at least 8 hours.   BUN 11 8 - 23 mg/dL   Creatinine, Ser 0.88 0.61 - 1.24 mg/dL   Calcium 7.8 (L) 8.9 - 10.3 mg/dL   GFR calc non Af Amer >60 >60 mL/min   Anion gap 9 5 - 15    Comment: Performed at [facility] [address] [place], [place] [address]  Glucose, capillary     Status: Abnormal   Collection Time: 09/21/20  6:12 AM  Result Value Ref Range   Glucose-Capillary 186 (H) 70 - 99 mg/dL    Comment: Glucose reference range applies only to samples taken after fasting for at least 8 hours.   Comment 1 Notify RN    Comment 2 Document in Chart      Imaging Results (Last 48 hours)  No results found.       Medical Problem List and Plan: 1.  Debility secondary to atrial fibrillation with RVR Acute encephalopathy/sepsis/multimedical.              -patient may  shower             -ELOS/Goals: 7-10d 2.  Antithrombotics: -DVT/anticoagulation: Eliquis             -antiplatelet therapy: Plavix 25 mg daily 3. Pain Management: Tramadol as needed 4. Mood: Provide emotional support             -antipsychotic agents: N/A 5. Neuropsych: This patient is capable of making decisions on his own behalf. 6. Skin/Wound Care: Routine skin checks 7. Fluids/Electrolytes/Nutrition: Routine in and outs with follow-up chemistries 8.  Diastolic congestive heart failure.  Continue Lasix 40 mg twice daily, Coreg CR80 mg daily, Entresto 97-103 mg twice daily.  Follow-up cardiology service.  Monitor for any signs of fluid overload 9.  A. fib with RVR.  Status post DCCV 09/19/2020.  Continue amiodarone as well as Toprol 25 mg daily, 10.  History of seizure  disorder.  Keppra 500 mg twice daily 11.  New onset diabetes mellitus.  Hemoglobin A1c pending.  Levemir 5 units twice daily.  Diabetic teaching 12.  Hyperlipidemia.  Lipitor 13.  Constipation.  MiraLAX twice daily, Colace twice daily  Lavon Paganini Angiulli, PA-C  "I have personally performed a face to face diagnostic evaluation of this patient.  Additionally, I have reviewed and concur with the physician assistant's documentation above."  Charlett Blake M.D. [facility] FAAPM&R (Neuromuscular Med) Diplomate Am Board of Electrodiagnostic Med Fellow Am Board of Interventional Pain

## 2020-09-29 NOTE — Plan of Care (Signed)
Progressing, will continue to monitor.  

## 2020-09-29 NOTE — Discharge Summary (Signed)
Physician Discharge Summary  Patient ID: Gregory [name] MRN: [id number] DOB/AGE: 1961-08-16 59 y.o.  Admit date: 09/09/2020 Discharge date: 09/29/2020  Admission Diagnoses:  Discharge Diagnoses:  Principal Problem:   Atrial fibrillation with rapid ventricular response (HCC) Active Problems:   Chronic combined systolic and diastolic heart failure (HCC)   CAD S/P percutaneous coronary angioplasty   Essential hypertension   Ventricular tachycardia (HCC)   Seizure disorder (HCC)   CHF (congestive heart failure) ([place])   Thrombocytopenia ([place])   Shock ([place])   Discharged Condition: stable  Hospital Course: Patient is a 59 year old male with past medical history significant for  coronary artery disease, chronic diastolic heart failure, ischemic cardiomyopathy, essential hypertension and status post cardiac defibrillation.  Patient initially presented to the ED with nausea vomiting and diarrhea, and was found to be in acute kidney injury, hypotensive requiring vasopressors, and was initially admitted to the ICU.  Work-up for possible infection came back negative.  During the hospital stay, patient developed atrial fibrillation with RVR.  Cardiology team was consulted and they recommended IV amiodarone and IV heparin.  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  Patient underwent cardioversion on 09/13/2020 which was unsuccessful.  EP was consulted and patient was started on amiodarone, and proceeded with DCCV on 09/19/2020.  Patient was converted to normal sinus rhythm.  During the hospital stay, patient developed acute confusion and leukocytosis suspected to be secondary to UTI/sepsis.  Patient was treated empirically on antibiotics.  Encephalopathy has resolved.  Patient was seen by the physical therapy team and CIR was recommended.  Patient will be discharged to CIR.  Ventricular fibrillation status post AICD and CAD/A. fib with RVR -CHADS Vasc score greater  than 4. -Patient will be discharged on amiodarone.   -Patient underwent cardiac catheterization that did not reveal any new obstructive disease. -Patient be discharged on Eliquis.  Persistent atrial fibrillation with RVR: -See above.  Acute confusional state/acute encephalopathy: -Suspected to be of infectious etiology.  -CT scan unremarkable unable to do MRI due to AICD. -Patient was treated with antibiotics empirically.   -Encephalopathy has resolved.    Severe sepsis due to UTI: -Treated empirically with IV Rocephin.   Acute on chronic diastolic heart failure: -Stable.   -Continue diuretics.   -Continue Entresto and Coreg.  Acute kidney injury: -Likely multifactorial. -Resolved.    Thrombocytopenia: -Days developed after starting heparin.   -Heparin was discontinued. -Thrombocytopenia has resolved.  Coronary artery disease: Stable.   Cardiac catheterization was not revealing.    Macrocytic anemia: Folate of 300, B12 was 683. Iron of 27 ferritin of 1000.  Obstructive sleep apnea: Continue CPAP at night.  Mild transaminitis: Abdominal ultrasound was negative for acute cholecystitis  Patient denied any abdominal pain. Resolved.  Newly diagnosed diabetes mellitus type 2: Hemoglobin A1c was 6.9%.   Continue to optimize blood sugar.    Seizures: Continue Keppra.  Consults: cardiology  Electrophysiology  Significant Diagnostic Studies:  -24 hours urine protein was 2.016 g  -CT head without contrast was done on 1000 2021 revealed: "Subtle hypodensity and loss of gray-white differentiation involving the right insula and portions of the right frontal lobe. While this could relate to streak artifact in this region, acute MCA territory infarct is not excluded. MRI could further evaluate if there is clinical concern".  -Cardiac catheterization done on 09/24/2020 revealed:  Ost RCA to Mid RCA lesion is 100% stenosed.  Previously placed 1st Mrg  stent (unknown type) is widely patent.  Prox Cx lesion is  100% stenosed.  Previously placed 1st Diag stent (unknown type) is widely patent.  Previously placed Prox LAD to Mid LAD stent (unknown type) is widely patent.  -Echocardiogram done on 09/22/2020 revealed: 1. Left ventricular ejection fraction, by estimation, is 40 to 45%. The  left ventricle has mildly decreased function. The left ventricle  demonstrates regional wall motion abnormalities (see scoring  diagram/findings for description). Left ventricular  diastolic parameters are consistent with Grade I diastolic dysfunction  (impaired relaxation).  2. Right ventricular systolic function was not well visualized. The right  ventricular size is normal.  3. The mitral valve is grossly normal. No evidence of mitral valve  regurgitation.  4. The aortic valve was not well visualized.  5. The inferior vena cava is normal in size with greater than 50%  respiratory variability, suggesting right atrial pressure of 3 mmHg.   Comparison(s): Slight decrease in LV function with different caliber  (non-contrasted) study. Slight decrease in global LVEF.   Discharge Exam: Blood pressure 130/87, pulse (!) 18, temperature 97.6 F (36.4 C), temperature source Oral, resp. rate 16, height 6\' 6"  (1.981 m), weight 135.7 kg, SpO2 100 %.   Disposition: Discharge disposition: 02-Transferred to [facility]  Discharge Instructions    Diet - low sodium heart healthy   Complete by: As directed    Increase activity slowly   Complete by: As directed      Allergies as of 09/29/2020   No Known Allergies     Medication List    STOP taking these medications   aspirin 81 MG tablet   FeroSul 325 (65 FE) MG tablet Generic drug: ferrous sulfate   hydrochlorothiazide 25 MG tablet Commonly known as: HYDRODIURIL   metoprolol succinate 25 MG 24 hr tablet Commonly known as: Toprol XL   sildenafil 50 MG tablet Commonly known as:  VIAGRA     TAKE these medications   amiodarone 400 MG tablet Commonly known as: PACERONE Amiodarone 400 mg po bid for 8 days, and then daily   apixaban 5 MG Tabs tablet Commonly known as: ELIQUIS Take 1 tablet (5 mg total) by mouth 2 (two) times daily.   atorvastatin 80 MG tablet Commonly known as: LIPITOR TAKE ONE TABLET BY MOUTH EVERY EVENING What changed: when to take this   clopidogrel 75 MG tablet Commonly known as: PLAVIX TAKE 1 TABLET(75 MG) BY MOUTH DAILY What changed: See the new instructions.   Coreg CR 80 MG 24 hr capsule Generic drug: carvedilol Take 1 capsule (80 mg total) by mouth daily.   docusate sodium 100 MG capsule Commonly known as: COLACE Take 1 capsule (100 mg total) by mouth 2 (two) times daily.   Entresto 97-103 MG Generic drug: sacubitril-valsartan TAKE 1 TABLET BY MOUTH TWICE DAILY   furosemide 40 MG tablet Commonly known as: LASIX Take 1 tablet (40 mg total) by mouth 2 (two) times daily.   insulin aspart 100 UNIT/ML injection Commonly known as: novoLOG Inject 0-20 Units into the skin 3 (three) times daily with meals.   insulin aspart 100 UNIT/ML injection Commonly known as: novoLOG Inject 0-5 Units into the skin at bedtime.   insulin detemir 100 UNIT/ML injection Commonly known as: LEVEMIR Inject 0.05 mLs (5 Units total) into the skin 2 (two) times daily.   levETIRAcetam 500 MG tablet Commonly known as: KEPPRA TAKE 1 TABLET(500 MG) BY MOUTH TWICE DAILY What changed: See the new instructions.   magnesium oxide 400 (241.3 Mg) MG tablet Commonly known as: MAG-OX Take  0.5 tablets (200 mg total) by mouth daily. Start taking on: September 30, 2020   pantoprazole 40 MG tablet Commonly known as: PROTONIX TAKE 1 TABLET(40 MG) BY MOUTH DAILY What changed: See the new instructions.   polyethylene glycol 17 g packet Commonly known as: MIRALAX / GLYCOLAX Take 17 g by mouth daily as needed for moderate constipation or severe  constipation.   potassium chloride 10 MEQ tablet Commonly known as: KLOR-CON TAKE 1 TABLET BY MOUTH EVERY DAY        Signed: Bonnell Public 09/29/2020, 1:44 PM

## 2020-09-29 NOTE — Progress Notes (Signed)
Progress Note  Patient Name: Gregory [name] Date of Encounter: 09/29/2020  Primary Cardiologist: Sanda Klein, MD   Subjective   Feels well, drowsy watching TV.  Plan for inpatient rehab today.  Inpatient Medications    Scheduled Meds: . amiodarone  400 mg Oral Q12H   Followed by  . [START ON 10/07/2020] amiodarone  400 mg Oral Daily  . apixaban  5 mg Oral BID  . atorvastatin  80 mg Oral QHS  . carvedilol  80 mg Oral Daily  . Chlorhexidine Gluconate Cloth  6 each Topical Daily  . clopidogrel  75 mg Oral Q breakfast  . docusate sodium  100 mg Oral BID  . furosemide  40 mg Oral BID  . insulin aspart  0-20 Units Subcutaneous TID WC  . insulin aspart  0-5 Units Subcutaneous QHS  . insulin detemir  5 Units Subcutaneous BID  . levETIRAcetam  500 mg Oral BID  . magnesium oxide  200 mg Oral Daily  . pantoprazole  40 mg Oral Daily  . polyethylene glycol  17 g Oral Daily  . potassium chloride  10 mEq Oral Daily  . sacubitril-valsartan  1 tablet Oral BID  . sodium chloride flush  3 mL Intravenous Q12H   Continuous Infusions: . sodium chloride     PRN Meds: sodium chloride, acetaminophen, acetaminophen, hydrocortisone cream, hydrOXYzine, metoprolol tartrate, morphine injection, ondansetron (ZOFRAN) IV, sodium chloride flush, traZODone   Vital Signs    Vitals:   09/29/20 0026 09/29/20 0530 09/29/20 0924 09/29/20 1133  BP: 129/87 117/64 123/78 130/87  Pulse: 74 75 76 (!) 18  Resp: 11 20 16 16   Temp: 97.8 F (36.6 C) 98.6 F (37 C) 97.8 F (36.6 C) 97.6 F (36.4 C)  TempSrc: Axillary Axillary Oral Oral  SpO2: 99% 97% 95% 100%  Weight:  135.7 kg    Height:        Intake/Output Summary (Last 24 hours) at 09/29/2020 1209 Last data filed at 09/29/2020 0930 Gross per 24 hour  Intake --  Output 2575 ml  Net -2575 ml   Filed Weights   09/26/20 0437 09/28/20 0610 09/29/20 0530  Weight: (!) 139.7 kg 134.9 kg 135.7 kg    Telemetry    Sinus rhythm,  first-degree AV block- Personally Reviewed  ECG    Sinus rhythm, PVC, PAC, inferior and anterolateral infarct pattern, nonspecific interventricular conduction delay- Personally Reviewed  Physical Exam   GEN: No acute distress.   Neck: No JVD Cardiac: regular rhythm, normal rate, no murmurs, rubs, or gallops.  Respiratory: Clear to auscultation bilaterally. GI: Soft, nontender, non-distended  MS: No edema; No deformity. Neuro:  Nonfocal  Psych: Normal affect   Labs    Chemistry Recent Labs  Lab 09/23/20 0107 09/23/20 0107 09/24/20 0149 09/24/20 1501 09/24/20 1502 09/24/20 1509 09/25/20 0151  NA 134*   < > 136   < > 138 139 137  K 3.8   < > 3.7   < > 4.2 4.0 4.5  CL 96*  --  97*  --   --   --  101  CO2 32  --  31  --   --   --  30  GLUCOSE 191*  --  190*  --   --   --  159*  BUN 12  --  9  --   --   --  9  CREATININE 0.85  --  0.88  --   --   --  0.86  CALCIUM 7.7*  --  7.7*  --   --   --  7.8*  GFRNONAA >60  --  >60  --   --   --  >60  ANIONGAP 6  --  8  --   --   --  6   < > = values in this interval not displayed.     Hematology Recent Labs  Lab 09/23/20 0107 09/23/20 0107 09/24/20 0149 09/24/20 1501 09/24/20 1502 09/24/20 1509 09/25/20 0151  WBC 10.1  --  10.0  --   --   --  8.3  RBC 3.09*  --  3.06*  --   --   --  3.02*  HGB 10.3*   < > 10.1*   < > 11.6* 10.5* 9.9*  HCT 31.1*   < > 31.1*   < > 34.0* 31.0* 30.6*  MCV 100.6*  --  101.6*  --   --   --  101.3*  MCH 33.3  --  33.0  --   --   --  32.8  MCHC 33.1  --  32.5  --   --   --  32.4  RDW 17.5*  --  17.2*  --   --   --  17.0*  PLT 279  --  262  --   --   --  251   < > = values in this interval not displayed.    Cardiac EnzymesNo results for input(s): TROPONINI in the last 168 hours. No results for input(s): TROPIPOC in the last 168 hours.   BNPNo results for input(s): BNP, PROBNP in the last 168 hours.   DDimer No results for input(s): DDIMER in the last 168 hours.   Radiology    No results  found.  Cardiac Studies   Echo  1. Left ventricular ejection fraction, by estimation, is 40 to 45%. The  left ventricle has mildly decreased function. The left ventricle  demonstrates regional wall motion abnormalities (see scoring  diagram/findings for description). Left ventricular  diastolic parameters are consistent with Grade I diastolic dysfunction  (impaired relaxation).  2. Right ventricular systolic function was not well visualized. The right  ventricular size is normal.  3. The mitral valve is grossly normal. No evidence of mitral valve  regurgitation.  4. The aortic valve was not well visualized.  5. The inferior vena cava is normal in size with greater than 50%  respiratory variability, suggesting right atrial pressure of 3 mmHg.   Comparison(s): Slight decrease in LV function with different caliber  (non-contrasted) study. Slight decrease in global LVEF.   Patient Profile     59 y.o. male with CAD-chronically occluded RCA with prior PCI to the LAD, diagonal, circumflex.  Heart failure with mildly reduced ejection fraction, recent EF 40 to 45%, and ventricular tachycardia status post ICD, currently in hospital with ICD shocks for ventricular tachycardia.  No progression of coronary artery disease on cath.  Assessment & Plan   Principal Problem:   Atrial fibrillation with rapid ventricular response (HCC) Active Problems:   Chronic combined systolic and diastolic heart failure (HCC)   CAD S/P percutaneous coronary angioplasty   Essential hypertension   Ventricular tachycardia (HCC)   Seizure disorder (HCC)   CHF (congestive heart failure) (HCC)   Thrombocytopenia (HCC)   Shock (HCC)   Ventricular tachycardia with ICD shocks. Atrial fibrillation status post DCCV -Continue amiodarone load 400 mg twice daily until October 03, 2020, then decrease to 400 mg daily for 10  days, and on 10/13/2020, decrease to 200 mg daily ongoing.  Patient has been seen by EP while  in hospital. -Continue beta-blocker and Eliquis.  CAD -Continue Plavix, Eliquis -Continue Coreg -Continue Lipitor   Chronic heart failure with mildly reduced ejection fraction, 40 to 45% -Continue Entresto, Coreg -Lasix 40 mg p.o. twice daily, repeat BMP in 1 week  Patient is being discharged to inpatient rehab today.  [facility] will sign off.     For questions or updates, please contact [place] Please consult [email or web address] for contact info under        Signed, Elouise Munroe, MD  09/29/2020, 12:09 PM

## 2020-09-29 NOTE — Progress Notes (Signed)
Inpatient Rehab Admissions Coordinator:  There is a bed available to admit pt to CIR today.  Dr. Marthenia Rolling in agreement. Pt, NSG, and TOC made aware. Pt wanted to inform son himself.   Gayland Curry, [place], [place] Admissions Coordinator [phone]

## 2020-09-30 ENCOUNTER — Inpatient Hospital Stay (HOSPITAL_COMMUNITY): Payer: Self-pay | Admitting: Occupational Therapy

## 2020-09-30 ENCOUNTER — Inpatient Hospital Stay (HOSPITAL_COMMUNITY): Payer: Self-pay

## 2020-09-30 LAB — GLUCOSE, CAPILLARY
Glucose-Capillary: 101 mg/dL — ABNORMAL HIGH (ref 70–99)
Glucose-Capillary: 122 mg/dL — ABNORMAL HIGH (ref 70–99)
Glucose-Capillary: 103 mg/dL — ABNORMAL HIGH (ref 70–99)

## 2020-09-30 MED ORDER — FUROSEMIDE 40 MG PO TABS
40.0000 mg | ORAL_TABLET | Freq: Two times a day (BID) | ORAL | Status: DC
  Administered 2020-09-30 – 2020-10-10 (×21): 40 mg via ORAL
  Filled 2020-09-30 (×21): qty 1

## 2020-09-30 MED ORDER — AMIODARONE HCL 200 MG PO TABS: 200.0000 mg | ORAL_TABLET | Freq: Every day | ORAL | Status: DC

## 2020-09-30 NOTE — Evaluation (Signed)
Physical Therapy Assessment and Plan  Patient Details  Name: Gregory [name] MRN: [id number] Date of Birth: 1961-08-19  PT Diagnosis: Abnormal posture, Abnormality of gait, Difficulty walking and Muscle weakness Rehab Potential: Good ELOS: 10-14 days   Today's Date: 09/30/2020 PT Individual Time: 6433-2951 PT Individual Time Calculation (min): 56 min    Hospital Problem: Principal Problem:   Debility   Past Medical History:  Past Medical History:  Diagnosis Date  . CAD (coronary artery disease) 12/01/2013  . Chronic combined systolic and diastolic CHF, NYHA class 1 ([place]) 12/01/2013  . Erectile dysfunction 12/01/2013  . HTN (hypertension) 12/01/2013  . Hyperlipidemia 12/01/2013  . Ischemic cardiomyopathy 12/01/2013  . Sleep apnea    Past Surgical History:  Past Surgical History:  Procedure Laterality Date  . ACHILLES TENDON REPAIR     lft foot  . CARDIAC CATHETERIZATION N/A 05/05/2016   Procedure: Left Heart Cath and Coronary Angiography;  Surgeon: Leonie Man, MD;  Location: [place] [facility];  Service: Cardiovascular;  Laterality: N/A;  . CARDIAC DEFIBRILLATOR PLACEMENT    . CARDIOVERSION N/A 09/13/2020   Procedure: CARDIOVERSION;  Surgeon: Werner Lean, MD;  Location: [facility] ENDOSCOPY;  Service: Cardiovascular;  Laterality: N/A;  . CARDIOVERSION N/A 09/19/2020   Procedure: CARDIOVERSION;  Surgeon: [name], MD;  Location: [facility] ENDOSCOPY;  Service: Cardiovascular;  Laterality: N/A;  . EP IMPLANTABLE DEVICE N/A 12/19/2016   Procedure: ICD Generator Changeout;  Surgeon: [name], MD;  Location: [place] [facility];  Service: Cardiovascular;  Laterality: N/A;  . RIGHT/LEFT HEART CATH AND CORONARY ANGIOGRAPHY N/A 09/24/2020   Procedure: RIGHT/LEFT HEART CATH AND CORONARY ANGIOGRAPHY;  Surgeon: Lorretta Harp, MD;  Location: [place] [facility];  Service: Cardiovascular;  Laterality: N/A;  . WRIST TENODESIS      Assessment & Plan Clinical  Impression: Patient is a 59 y.o. year old male with history of hyperlipidemia, seizure disorder maintained on Keppra, CAD, diastolic congestive heart failure maintained on Entresto, ischemic cardiomyopathy, VT status post AICD maintained on Plavix, quit smoking 7 years ago.  Per chart review lives with his son who works from home.  Multilevel home 10 steps to entry.  Reportedly independent prior to admission and active.  Presented 09/09/2020 with generalized weakness and dizziness in the setting of multiple days of diarrhea.  Denied any nausea vomiting or abdominal pain.  Ultrasound the abdomen showed cholelithiasis without evidence of acute cholecystitis and fatty liver.  Chest x-ray negative.  Admission chemistry sodium 130, potassium 3.1, chloride 95, glucose 189, BUN 55, creatinine 5.60, calcium 5.2, AST 88, lactic acid 3.2, troponin 48, magnesium 1.6, urine culture no growth.  Echocardiogram with ejection fraction of 55 to 60% no wall motion abnormalities.  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  Cardiac catheterization completed 09/24/2020 per Dr. Alvester Chou for follow-up with results pending.  Patient did experience some altered mental status cranial CT scan completed which was reviewed by neurology services no acute process noted.  He was placed empirically on IV Rocephin for question UTI latest cultures negative.  Follow-up cardiology services for acute on chronic diastolic congestive heart failure and patient has undergone diuresis.  Patient with persistent elevations in glucose 186-237 and hemoglobin A1c 6.9 currently maintained on sliding scale insulin as well  as scheduled Levemir 5 units twice daily.  Patient remains  on Panora for history of seizure disorder.  AKI likely prerenal in the setting of A. fib with RVR placed on gentle IV fluids and latest creatinine 0.86.  Tolerating a regular diet.   Patient currently requires min with mobility secondary to muscle weakness, decreased cardiorespiratoy endurance and decreased standing balance, decreased postural control and decreased balance strategies.  Prior to hospitalization, patient was independent  with mobility and lived with Family in a House home.  Home access is  Level entry.  Patient will benefit from skilled PT intervention to maximize safe functional mobility, minimize fall risk and decrease caregiver burden for planned discharge home with 24 hour assist.  Anticipate patient will benefit from follow up OP at discharge.  PT - End of Session Activity Tolerance: Tolerates 30+ min activity with multiple rests Endurance Deficit: Yes Endurance Deficit Description: pt required rest breaks throughout PT Assessment Rehab Potential (ACUTE/IP ONLY): Good PT Barriers to Discharge: Inaccessible home environment;Home environment access/layout PT Barriers to Discharge Comments: 17 steps to get to bedroom/bathroom PT Patient demonstrates impairments in the following area(s): Balance;Endurance;Motor PT Transfers Functional Problem(s): Bed Mobility;Bed to Chair;Car;Furniture PT Locomotion Functional Problem(s): Ambulation;Wheelchair Mobility;Stairs PT Plan PT Intensity: Minimum of 1-2 x/day ,45 to 90 minutes PT Frequency: 5 out of 7 days PT Duration Estimated Length of Stay: 10-14 days PT Treatment/Interventions: Ambulation/gait training;Discharge planning;Functional mobility training;Psychosocial support;Therapeutic Activities;Balance/vestibular training;Disease management/prevention;Neuromuscular re-education;Skin care/wound management;Therapeutic Exercise;Wheelchair propulsion/positioning;Cognitive remediation/compensation;DME/adaptive equipment instruction;Pain  management;Splinting/orthotics;UE/LE Strength taining/ROM;Community reintegration;Functional electrical stimulation;Patient/family education;Stair training;UE/LE Coordination activities PT Transfers Anticipated Outcome(s): mod I with LRAD PT Locomotion Anticipated Outcome(s): supervision with LRAD PT Recommendation Follow Up Recommendations: Outpatient PT Patient destination: Home Equipment Recommended: To be determined Equipment Details: pt has none   PT Evaluation Precautions/Restrictions Precautions Precautions: Fall Restrictions Weight Bearing Restrictions: No Home Living/Prior Functioning Home Living Available Help at Discharge: Family;Available 24 hours/day (son works from home and can provide 24/7 assist) Type of Home: House Home Access: Level entry Home Layout: Multi-level Alternate Level Stairs-Number of Steps: 17 Alternate Level Stairs-Rails: Right Bathroom Shower/Tub: Chiropodist: Handicapped height Bathroom Accessibility: Yes  Lives With: Family Prior Function Level of Independence: Independent with basic ADLs;Independent with transfers;Independent with homemaking with ambulation;Independent with gait  Able to Take Stairs?: Yes Driving: Yes Vocation: Retired Comments: pt going to gym 5 days/week Cognition Overall Cognitive Status: Within Functional Limits for tasks assessed Arousal/Alertness: Awake/alert Orientation Level: Oriented X4 Memory: Impaired Awareness: Appears intact Problem Solving: Appears intact Safety/Judgment: Appears intact Sensation Sensation Light Touch: Appears Intact Proprioception: Appears Intact Coordination Gross Motor Movements are Fluid and Coordinated: No Fine Motor Movements are Fluid and Coordinated: Yes Coordination and Movement Description: grossly uncoordinated due to generalized weakness, decreased balance/postural control, and poor endurance Finger Nose Finger Test: [facility] bilaterally Heel Shin Test: WFL  bilaterally Motor  Motor Motor: Abnormal postural alignment and control Motor - Skilled Clinical Observations: grossly uncoordinated due to generalized weakness, decreased balance/postural control, and poor endurance  Trunk/Postural Assessment  Cervical Assessment Cervical Assessment: Within Functional Limits Thoracic Assessment Thoracic Assessment: Within Functional Limits Lumbar Assessment Lumbar Assessment: Within Functional Limits Postural Control Postural Control: Deficits on evaluation  Balance Balance Balance Assessed: Yes Static Sitting Balance Static Sitting - Balance Support: Feet supported;Bilateral upper extremity supported Static Sitting - Level of Assistance: 5: Stand by assistance (supervision) Dynamic Sitting Balance Dynamic Sitting - Balance Support: Feet supported;Bilateral upper extremity supported Dynamic Sitting - Level of Assistance: 5: Stand by assistance (supervision)  Static Standing Balance Static Standing - Balance Support: No upper extremity supported Static Standing - Level of Assistance: 5: Stand by assistance (CGA) Dynamic Standing Balance Dynamic Standing - Balance Support: No upper extremity supported Dynamic Standing - Level of Assistance: 4: Min assist Extremity Assessment  RLE Assessment RLE Assessment: Exceptions to [facility] General Strength Comments: grossly generalized to 4-/5 LLE Assessment LLE Assessment: Exceptions to [facility] General Strength Comments: grossly generalized to 4-/5  Care Tool Care Tool Bed Mobility Roll left and right activity   Roll left and right assist level: Supervision/Verbal cueing    Sit to lying activity        Lying to sitting edge of bed activity   Lying to sitting edge of bed assist level: Supervision/Verbal cueing     Care Tool Transfers Sit to stand transfer   Sit to stand assist level: Minimal Assistance - Patient > 75%    Chair/bed transfer   Chair/bed transfer assist level: Minimal Assistance -  Patient > 75%     Toilet transfer   Assist Level: Minimal Assistance - Patient > 75%    Car transfer   Car transfer assist level: Minimal Assistance - Patient > 75%      Care Tool Locomotion Ambulation   Assist level: Minimal Assistance - Patient > 75% Assistive device: Other (comment) (1 handrail) Max distance: 59ft  Walk 10 feet activity   Assist level: Minimal Assistance - Patient > 75% Assistive device: Other (comment) (1 handrail)   Walk 50 feet with 2 turns activity Walk 50 feet with 2 turns activity did not occur: Safety/medical concerns (fatigue, LE weakness, decreased balance/postural control)      Walk 150 feet activity Walk 150 feet activity did not occur: Safety/medical concerns (fatigue, LE weakness, decreased balance/postural control)      Walk 10 feet on uneven surfaces activity Walk 10 feet on uneven surfaces activity did not occur: Safety/medical concerns (fatigue, LE weakness, decreased balance/postural control)      Stairs   Assist level: Minimal Assistance - Patient > 75% Stairs assistive device: 2 hand rails Max number of stairs: 4  Walk up/down 1 step activity   Walk up/down 1 step (curb) assist level: Minimal Assistance - Patient > 75% Walk up/down 1 step or curb assistive device: 2 hand rails    Walk up/down 4 steps activity Walk up/down 4 steps assist level: Minimal Assistance - Patient > 75% Walk up/down 4 steps assistive device: 2 hand rails  Walk up/down 12 steps activity Walk up/down 12 steps activity did not occur: Safety/medical concerns (fatigue, LE weakness, decreased balance/postural control)      Pick up small objects from floor Pick up small object from the floor (from standing position) activity did not occur: Safety/medical concerns (LE weakness, decreased balance/postural control)      Wheelchair Will patient use wheelchair at discharge?: No Type of Wheelchair: Manual   Wheelchair assist level: Supervision/Verbal cueing Max  wheelchair distance: 54ft  Wheel 50 feet with 2 turns activity   Assist Level: Supervision/Verbal cueing  Wheel 150 feet activity   Assist Level: Maximal Assistance - Patient 25 - 49%    Refer to Care Plan for Long Term Goals  SHORT TERM GOAL WEEK 1 PT Short Term Goal 1 (Week 1): pt will transfer stand<>pivot with LRAD and CGA PT Short Term Goal 2 (Week 1): Pt will ambulate 51ft with LRAD CGA PT Short Term Goal 3 (Week 1): Pt will navigate 8 steps with CGA  Recommendations for other  services: None   Skilled Therapeutic Intervention Evaluation completed (see details above and below) with education on PT POC and goals and individual treatment initiated with focus on functional mobility/transfers, generalized strengthening, dynamic standing balance/coordination, ambulation, and improved activity tolerance. Received pt supine in bed with RN present administering medications, pt educated on PT evaluation, CIR policies, and therapy schedule and agreeable. Pt denied any pain during session. Noted pt on 1L O2 via [place] with O2 sat 95%. Pt reports he did not use O2 at home and is only on it now because his ICD has shocked him twice and having the O2 helps reduce frequency of shocks. Pt agreeable to attempt session without O2 on RA to simulate home enviornment. Pt donned underwear and pants in supine with mod A to thread LEs through and transferred supine<>sitting EOB from flat bed with use of bedrails and supervision. Therapist provided pt with 22x18 WC and RW. Pt transferred bed<>WC stand<>pivot without AD and min A and performed WC mobility 39ft using bilateral UEs and supervision and transported remainder of way to ortho gym total A due to fatigue. Pt performed simulated car transfer without AD and min A with cues for turning technique. O2 sat 93% increasing to 96% on RA. Pt ambulated 8ft with 1 handrail and min A for balance. Pt demonstrated narrow BOS, decreased stride length, and decreased trunk  rotation. Pt requested to hold onto rail due to feeling like L LE was too weak to go without it. Pt navigated 4 steps with 2 rails ascending and descending with a step to pattern with min A and cues for stepping sequence. Pt reported 6/10 fatigue at end of session and transported back to room in [facility] total A. Concluded session with pt sitting in WC on RA, needs within reach, awaiting OT session. Safety plan updated.   Mobility Bed Mobility Bed Mobility: Rolling Right;Rolling Left;Supine to Sit Rolling Right: Supervision/verbal cueing Rolling Left: Supervision/Verbal cueing Supine to Sit: Supervision/Verbal cueing Transfers Transfers: Sit to Stand;Stand to Sit;Stand Pivot Transfers Sit to Stand: Minimal Assistance - Patient > 75% Stand to Sit: Minimal Assistance - Patient > 75% Stand Pivot Transfers: Minimal Assistance - Patient > 75% Stand Pivot Transfer Details: Verbal cues for technique;Visual cues/gestures for sequencing Stand Pivot Transfer Details (indicate cue type and reason): verbal cues for turning technique and foot placement Transfer (Assistive device): None Locomotion  Gait Ambulation: Yes Gait Assistance: Minimal Assistance - Patient > 75% Gait Distance (Feet): 20 Feet Assistive device: None Gait Assistance Details: Verbal cues for technique Gait Assistance Details: verbal cues for technique/safety Gait Gait: Yes Gait Pattern: Impaired Gait Pattern: Step-to pattern;Decreased trunk rotation;Decreased stride length;Decreased step length - right;Decreased step length - left;Poor foot clearance - left;Poor foot clearance - right Gait velocity: decreased Stairs / Additional Locomotion Stairs: Yes Stairs Assistance: Minimal Assistance - Patient > 75% Stair Management Technique: Two rails Number of Stairs: 4 Height of Stairs: 6 Wheelchair Mobility Wheelchair Mobility: Yes Wheelchair Assistance: Chartered loss adjuster: Both upper  extremities Wheelchair Parts Management: Needs assistance Distance: 93ft   Discharge Criteria: Patient will be discharged from PT if patient refuses treatment 3 consecutive times without medical reason, if treatment goals not met, if there is a change in medical status, if patient makes no progress towards goals or if patient is discharged from hospital.  The above assessment, treatment plan, treatment alternatives and goals were discussed and mutually agreed upon: by patient  Alfonse Alpers PT, DPT  09/30/2020, 12:21  PM

## 2020-09-30 NOTE — Progress Notes (Signed)
Inpatient Rehabilitation Medication Review by a Pharmacist  A complete drug regimen review was completed for this patient to identify any potential clinically significant medication issues.  Clinically significant medication issues were identified:  yes   Type of Medication Issue Identified Description of Issue Urgent (address now) Non-Urgent (address on AM team rounds) Plan Plan Accepted by Provider? (Yes / No / Pending AM Rounds)  Drug Interaction(s) (clinically significant)       Duplicate Therapy       Allergy       No Medication Administration End Date       Incorrect Dose       Additional Drug Therapy Needed  Lasix 40 mg BID not restarted yet Non-urgent Restart Lasix 40 mg BID Resumed  Other  Current Amiodarone order for 400 mg BID then 400 mg daily starting 10/28  Non-urgent  Per Dr. Margaretann Loveless (cardiology) note on 10/16, Amiodarone 400 mg BID until 10/03/20, then 400 mg daily, then 200 mg daily starting on 10/13/20 Adjusted    Name of provider notified for urgent issues identified: Dr. Letta Pate   Provider Method of Notification: Secure chat   For non-urgent medication issues to be resolved on team rounds tomorrow morning a [facility] [place] was sent to: handoff updated with medication issues identified for follow-up   Pharmacist comments: Pharmacist handoff updated with medication issues identified for follow-up  Time spent performing this drug regimen review (minutes):  Brady, PharmD, BCPS Clinical Pharmacist [phone] Please check AMION for all [place] numbers 09/30/2020

## 2020-09-30 NOTE — Evaluation (Signed)
Occupational Therapy Assessment and Plan  Patient Details  Name: Gregory [name] MRN: [id number] Date of Birth: 23-Sep-1961  OT Diagnosis: muscle weakness (generalized) Rehab Potential: Rehab Potential (ACUTE ONLY): Good ELOS: 7-9 days   Today's Date: 09/30/2020 OT Individual Time: 1660-6301 OT Individual Time Calculation (min): 55 min     Hospital Problem: Principal Problem:   Debility   Past Medical History:  Past Medical History:  Diagnosis Date  . CAD (coronary artery disease) 12/01/2013  . Chronic combined systolic and diastolic CHF, NYHA class 1 ([place]) 12/01/2013  . Erectile dysfunction 12/01/2013  . HTN (hypertension) 12/01/2013  . Hyperlipidemia 12/01/2013  . Ischemic cardiomyopathy 12/01/2013  . Sleep apnea    Past Surgical History:  Past Surgical History:  Procedure Laterality Date  . ACHILLES TENDON REPAIR     lft foot  . CARDIAC CATHETERIZATION N/A 05/05/2016   Procedure: Left Heart Cath and Coronary Angiography;  Surgeon: Leonie Man, MD;  Location: [place] [facility];  Service: Cardiovascular;  Laterality: N/A;  . CARDIAC DEFIBRILLATOR PLACEMENT    . CARDIOVERSION N/A 09/13/2020   Procedure: CARDIOVERSION;  Surgeon: Werner Lean, MD;  Location: [facility] ENDOSCOPY;  Service: Cardiovascular;  Laterality: N/A;  . CARDIOVERSION N/A 09/19/2020   Procedure: CARDIOVERSION;  Surgeon: [name], MD;  Location: [facility] ENDOSCOPY;  Service: Cardiovascular;  Laterality: N/A;  . EP IMPLANTABLE DEVICE N/A 12/19/2016   Procedure: ICD Generator Changeout;  Surgeon: [name], MD;  Location: [place] [facility];  Service: Cardiovascular;  Laterality: N/A;  . RIGHT/LEFT HEART CATH AND CORONARY ANGIOGRAPHY N/A 09/24/2020   Procedure: RIGHT/LEFT HEART CATH AND CORONARY ANGIOGRAPHY;  Surgeon: Lorretta Harp, MD;  Location: [place] [facility];  Service: Cardiovascular;  Laterality: N/A;  . WRIST TENODESIS      Assessment & Plan Clinical Impression: Patient  is a 59 y.o. year old male history of hyperlipidemia, seizure disorder maintained on Keppra, CAD, diastolic congestive heart failure maintained on Entresto, ischemic cardiomyopathy, VTstatus post AICD maintained on Plavix, quit smoking 7 years ago. Per chart review lives with his son who works from home. Multilevel home 10 steps to entry. Reportedly independent prior to admission and active. Presented 09/09/2020 with generalized weakness and dizziness in the setting of multiple days of diarrhea. Denied any nausea vomiting or abdominal pain. Ultrasound the abdomen showed cholelithiasis without evidence of acute cholecystitis and fatty liver. Chest x-ray negative. Admission chemistry sodium 130, potassium 3.1, chloride 95, glucose 189, BUN 55, creatinine 5.60, calcium 5.2, AST 88, lactic acid 3.2, troponin 48, magnesium 1.6, urine culture no growth. Echocardiogram with ejection fraction of 55 to 60% no wall motion abnormalities. 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 Alvester Chou for follow-up showed no obstructive disease.Patient did experience some altered mental status cranial CT scan completed which was reviewed by neurology services no acute process noted. He was placed empirically on IV Rocephin for question UTI latest cultures negative. Follow-up cardiology services for acute on chronic diastolic congestive heart failure and patient has undergone diuresis. Patient with persistent elevations in glucose 186-237 and hemoglobin A1c6.9currently maintained on sliding scale insulin as well as scheduled Levemir 5 units twice daily. Patient remains on [place] for  history of seizure disorder. AKI likely prerenal in the setting  of A. fib with RVR placed on gentle IV fluids and latest creatinine 0.86. Tolerating a regular diet.   Patient transferred to CIR on 09/29/2020 .    Patient currently requires min to mod A  with basic self-care skills and min A with basic mobility  secondary to muscle weakness, decreased cardiorespiratoy endurance and decreased balance strategies and getting up and down .  Prior to hospitalization, patient could complete ADL with independent .  Patient will benefit from skilled intervention to decrease level of assist with basic self-care skills and increase independence with basic self-care skills prior to discharge home with care partner.  Anticipate patient will require intermittent supervision and follow up home health.  OT - End of Session Activity Tolerance: Tolerates < 10 min activity, no significant change in vital signs OT Assessment Rehab Potential (ACUTE ONLY): Good OT Patient demonstrates impairments in the following area(s): Balance;Endurance;Motor OT Basic ADL's Functional Problem(s): Bathing;Dressing;Toileting OT Transfers Functional Problem(s): Toilet;Tub/Shower OT Additional Impairment(s): None OT Plan OT Intensity: Minimum of 1-2 x/day, 45 to 90 minutes OT Frequency: 5 out of 7 days OT Duration/Estimated Length of Stay: 7-9 days OT Treatment/Interventions: Balance/vestibular training;Discharge planning;Pain management;Self Care/advanced ADL retraining;Therapeutic Activities;UE/LE Coordination activities;Disease mangement/prevention;Functional mobility training;Patient/family education;Skin care/wound managment;Therapeutic Exercise;DME/adaptive equipment instruction;Community reintegration;Neuromuscular re-education;Psychosocial support;UE/LE Strength taining/ROM;Splinting/orthotics OT Self Feeding Anticipated Outcome(s): n/A OT Basic Self-Care Anticipated Outcome(s): MOD I OT Toileting Anticipated Outcome(s):  mod I OT Bathroom Transfers Anticipated Outcome(s): mod i OT Recommendation Patient destination: Home Follow Up Recommendations: Home health OT Equipment Recommended: To be determined   OT Evaluation Precautions/Restrictions  Precautions Precautions: Fall Restrictions Weight Bearing Restrictions: No General Chart Reviewed: Yes Family/Caregiver Present: No Vital Signs  able to tolerate RA throughout session with sats >95% Pain  no c/o pain in session  Home Living/Prior [place] expects to be discharged to:: Private residence Living Arrangements: Children Available Help at Discharge: Family, Available 24 hours/day Type of Home: House Home Access: Level entry Home Layout: Multi-level Alternate Level Stairs-Number of Steps: 17 Alternate Level Stairs-Rails: Right Bathroom Shower/Tub: Chiropodist: Handicapped height Bathroom Accessibility: Yes  Lives With: Family Prior Function Level of Independence: Independent with basic ADLs, Independent with transfers, Independent with homemaking with ambulation, Independent with gait  Able to Take Stairs?: Yes Driving: Yes Vocation: Retired Comments: pt going to gym 5 days/week Vision Baseline Vision/History: Wears glasses Wears Glasses: At all times Vision Assessment?: No apparent visual deficits Perception  Perception: Within Functional Limits Praxis Praxis: Intact Cognition Arousal/Alertness: Awake/alert Orientation Level: Person;Place;Situation Person: Oriented Place: Oriented Situation: Oriented Year: 2021 Month: October Day of Week: Correct Memory: Impaired Immediate Memory Recall: Sock;Blue;Bed Memory Recall Sock: Without Cue Memory Recall Blue: Without Cue Memory Recall Bed: Not able to recall Awareness: Appears intact Problem Solving: Appears intact Safety/Judgment: Appears intact Sensation Sensation Light Touch: Appears Intact Proprioception: Appears  Intact Coordination Gross Motor Movements are Fluid and Coordinated: No Fine Motor Movements are Fluid and Coordinated: Yes Finger Nose Finger Test: WFL bilaterally Heel Shin Test: WFL bilaterally Motor  Motor Motor - Skilled Clinical Observations: generalized weakness  Trunk/Postural Assessment  Cervical Assessment Cervical Assessment: Within Functional Limits Thoracic Assessment Thoracic Assessment: Within Functional Limits (rounded shoulders) Lumbar Assessment Lumbar Assessment: Within Functional Limits  Balance Balance Balance Assessed: Yes Dynamic Sitting Balance Dynamic Sitting - Balance Support: During functional activity Dynamic Sitting - Level of Assistance: 5: Stand by assistance Static Standing Balance Static Standing - Level of Assistance: 5: Stand by assistance Dynamic Standing Balance Dynamic Standing -  Level of Assistance: 4: Min assist Extremity/Trunk Assessment RUE Assessment RUE Assessment: Within Functional Limits LUE Assessment LUE Assessment: (P) Within Functional Limits  Care Tool Care Tool Self Care Eating   Eating Assist Level: Independent    Oral Care  Oral care, brush teeth, clean dentures activity did not occur:  (declined at this time due to fatigue)      Bathing   Body parts bathed by patient: Right arm;Left arm;Chest;Abdomen;Front perineal area;Right upper leg;Left upper leg;Face Body parts bathed by helper: Right lower leg;Left lower leg   Assist Level: Minimal Assistance - Patient > 75%    Upper Body Dressing(including orthotics)   What is the patient wearing?: Pull over shirt   Assist Level: Minimal Assistance - Patient > 75%    Lower Body Dressing (excluding footwear)   What is the patient wearing?: Underwear/pull up;Pants Assist for lower body dressing: Minimal Assistance - Patient > 75%    Putting on/Taking off footwear   What is the patient wearing?: Non-skid slipper socks;Ted hose Assist for footwear: Maximal Assistance -  Patient 25 - 49%       Care Tool Toileting Toileting activity   Assist for toileting: Minimal Assistance - Patient > 75%     Care Tool Bed Mobility Roll left and right activity   Roll left and right assist level: Supervision/Verbal cueing    Sit to lying activity  supervision       Lying to sitting edge of bed activity   Lying to sitting edge of bed assist level: Supervision/Verbal cueing     Care Tool Transfers Sit to stand transfer   Sit to stand assist level: Minimal Assistance - Patient > 75%    Chair/bed transfer   Chair/bed transfer assist level: Minimal Assistance - Patient > 75%     Toilet transfer   Assist Level: Minimal Assistance - Patient > 75%     Care Tool Cognition Expression of Ideas and Wants Expression of Ideas and Wants: Without difficulty (complex and basic) - expresses complex messages without difficulty and with speech that is clear and easy to understand   Understanding Verbal and Non-Verbal Content Understanding Verbal and Non-Verbal Content: Understands (complex and basic) - clear comprehension without cues or repetitions   Memory/Recall Ability *first 3 days only      Refer to Care Plan for Long Term Goals  SHORT TERM GOAL WEEK 1 OT Short Term Goal 1 (Week 1): LTG=STG  mod I overall  Recommendations for other services: None    Skilled Therapeutic Intervention Ot eval initiated with pt with Ot purpose, role and goals discussed. Pt reports fatigue with very little activity and requires frequent rest breaks. Pt was able to maintain his O2 above 95% throughout session on RA. Pt ambulated from bed to toilet in bathroom with min A with extra time. Pt required A for sit to stands from lower surfaces. Pt performed standing balance with contact guard  Assisted with washing pt's back and lower legs. Pt reports PTA being able to don his right sock and shoe but have difficulty with left for unknown reason. Pt ambulated back out to EOB to dress. Pt requested  height of bed be elevated for sit to stands as he has a high bed at home to stand from. Pt able to stand pushing up from EOB. Pt requested to lay back down instead of sitting up. Min guard to return to supine.   Left resting in the bed.   ADL ADL Eating: Independent  Upper Body Bathing: Setup Where Assessed-Upper Body Bathing: Shower Lower Body Bathing: Minimal assistance Where Assessed-Lower Body Bathing: Shower Upper Body Dressing: Minimal assistance Where Assessed-Upper Body Dressing: Edge of bed Lower Body Dressing: Minimal assistance (without footwear- can only up on right sock - needs ATEDS) Toileting: Minimal assistance Toilet Transfer: Minimal assistance;Moderate assistance Social research officer, government: Environmental education officer Method: Ambulating Mobility  Transfers Sit to Stand: Minimal Assistance - Patient > 75% Stand to Sit: Minimal Assistance - Patient > 75%   Discharge Criteria: Patient will be discharged from OT if patient refuses treatment 3 consecutive times without medical reason, if treatment goals not met, if there is a change in medical status, if patient makes no progress towards goals or if patient is discharged from hospital.  The above assessment, treatment plan, treatment alternatives and goals were discussed and mutually agreed upon: by patient  Nicoletta Ba 09/30/2020, 9:54 AM

## 2020-09-30 NOTE — Progress Notes (Signed)
[place] PHYSICAL MEDICINE & REHABILITATION PROGRESS NOTE   Subjective/Complaints:  Patient complains of constipation, no abdominal pain   Reports that he is urinating well Was very active in therapy today.  He went up and down steps 6 steps, also ambulated 26 feet without assistive device. Review of systems negative chest pain shortness of breath nausea vomiting diarrhea or constipation  Objective:   No results found. No results for input(s): WBC, HGB, HCT, PLT in the last 72 hours. No results for input(s): NA, K, CL, CO2, GLUCOSE, BUN, CREATININE, CALCIUM in the last 72 hours.  Intake/Output Summary (Last 24 hours) at 09/30/2020 1002 Last data filed at 09/30/2020 0720 Gross per 24 hour  Intake --  Output 1000 ml  Net -1000 ml        Physical Exam: Vital Signs Blood pressure 112/78, pulse 65, temperature 97.9 F (36.6 C), temperature source Oral, resp. rate 20, height 6\' 6"  (1.981 m), weight 133.3 kg, SpO2 100 %.   General: No acute distress Mood and affect are appropriate Heart: Regular rate and rhythm no rubs murmurs or extra sounds Lungs: Clear to auscultation, breathing unlabored, no rales or wheezes Abdomen: Positive bowel sounds, soft nontender to palpation, nondistended Extremities: No clubbing, cyanosis, or edema Skin: No evidence of breakdown, no evidence of rash Neurologic: Cranial nerves II through XII intact, motor strength is 4/5 in bilateral deltoid, bicep, tricep, grip, hip flexor, knee extensors, ankle dorsiflexor and plantar flexor  Musculoskeletal: Full range of motion in all 4 extremities. No joint swelling   Assessment/Plan: 1. Functional deficits secondary to debility  which require 3+ hours per day of interdisciplinary therapy in a comprehensive inpatient rehab setting.  Physiatrist is providing close team supervision and 24 hour management of active medical problems listed below.  Physiatrist and rehab team continue to assess barriers to  discharge/monitor patient progress toward functional and medical goals  Care Tool:  Bathing    Body parts bathed by patient: Right arm, Left arm, Chest, Abdomen, Front perineal area, Right upper leg, Left upper leg, Face   Body parts bathed by helper: Right lower leg, Left lower leg     Bathing assist Assist Level: Minimal Assistance - Patient > 75%     Upper Body Dressing/Undressing Upper body dressing   What is the patient wearing?: Pull over shirt    Upper body assist Assist Level: Minimal Assistance - Patient > 75%    Lower Body Dressing/Undressing Lower body dressing      What is the patient wearing?: Underwear/pull up, Pants     Lower body assist Assist for lower body dressing: Minimal Assistance - Patient > 75%     Toileting Toileting    Toileting assist Assist for toileting: Minimal Assistance - Patient > 75%     Transfers Chair/bed transfer  Transfers assist     Chair/bed transfer assist level: Minimal Assistance - Patient > 75%     Locomotion Ambulation   Ambulation assist      Assist level: Minimal Assistance - Patient > 75% Assistive device: Other (comment) (1 handrail) Max distance: 66ft   Walk 10 feet activity   Assist     Assist level: Minimal Assistance - Patient > 75% Assistive device: Other (comment) (1 handrail)   Walk 50 feet activity   Assist Walk 50 feet with 2 turns activity did not occur: Safety/medical concerns (fatigue, LE weakness, decreased balance/postural control)         Walk 150 feet activity   Assist Walk  150 feet activity did not occur: Safety/medical concerns (fatigue, LE weakness, decreased balance/postural control)         Walk 10 feet on uneven surface  activity   Assist Walk 10 feet on uneven surfaces activity did not occur: Safety/medical concerns (fatigue, LE weakness, decreased balance/postural control)         Wheelchair     Assist Will patient use wheelchair at discharge?:  No Type of Wheelchair: Manual    Wheelchair assist level: Supervision/Verbal cueing Max wheelchair distance: 35ft    Wheelchair 50 feet with 2 turns activity    Assist        Assist Level: Supervision/Verbal cueing   Wheelchair 150 feet activity     Assist      Assist Level: Maximal Assistance - Patient 25 - 49%   Blood pressure 112/78, pulse 65, temperature 97.9 F (36.6 C), temperature source Oral, resp. rate 20, height 6\' 6"  (1.981 m), weight 133.3 kg, SpO2 100 %.   Medical Problem List and Plan: 1.Debilitysecondary torenal failure and  atrial fibrillation with RVR Acute encephalopathy/sepsis/multimedical.  -patient may  shower -ELOS/Goals: 7-10d 2. Antithrombotics: -DVT/anticoagulation:Eliquis -antiplatelet therapy: Plavix 75 mg daily 3. Pain Management:Tramadol as needed 4. Mood:Provide emotional support -antipsychotic agents: N/A 5. Neuropsych: This patientiscapable of making decisions on hisown behalf. 6. Skin/Wound Care:Routine skin checks 7. Fluids/Electrolytes/Nutrition:Routine in and outs with follow-up chemistries 8. Diastolic congestive heart failure. ContinueLasix 40 mg twice daily, Coreg CR80 mg daily, Entresto 97-103 mg twice daily. Follow-up cardiology service. Monitor for any signs of fluid overload 9. A. fib with RVR. Status post DCCV 09/19/2020. Continue amiodarone as well as carvedilol controlled release to 80 mg/day Vitals:   09/29/20 2129 09/30/20 0550  BP:  112/78  Pulse: 74 65  Resp: 18 20  Temp:  97.9 F (36.6 C)  SpO2: 98% 100%  Heart rates are well controlled 10/17  10. History of seizure disorder. Keppra 500 mg twice daily 11. New onset diabetes mellitus. Hemoglobin A1c pending. Levemir 5 units twice daily. Diabetic teaching CBG (last 3)  Recent Labs    09/29/20 1129 09/29/20 1753 09/30/20 0623  GLUCAP 133* 137* 101*  Controlled on current  medications  12. Hyperlipidemia. Lipitor 13. Constipation. MiraLAX twice daily, Colace twice daily    LOS: 1 days A FACE TO FACE EVALUATION WAS PERFORMED  Charlett Blake 09/30/2020, 10:02 AM

## 2020-10-01 ENCOUNTER — Inpatient Hospital Stay (HOSPITAL_COMMUNITY): Payer: Self-pay | Admitting: Occupational Therapy

## 2020-10-01 ENCOUNTER — Inpatient Hospital Stay (HOSPITAL_COMMUNITY): Payer: Self-pay

## 2020-10-01 DIAGNOSIS — I4891 Unspecified atrial fibrillation: Secondary | ICD-10-CM

## 2020-10-01 DIAGNOSIS — D62 Acute posthemorrhagic anemia: Secondary | ICD-10-CM

## 2020-10-01 DIAGNOSIS — R569 Unspecified convulsions: Secondary | ICD-10-CM

## 2020-10-01 DIAGNOSIS — E46 Unspecified protein-calorie malnutrition: Secondary | ICD-10-CM

## 2020-10-01 DIAGNOSIS — I5032 Chronic diastolic (congestive) heart failure: Secondary | ICD-10-CM

## 2020-10-01 DIAGNOSIS — E119 Type 2 diabetes mellitus without complications: Secondary | ICD-10-CM

## 2020-10-01 DIAGNOSIS — E8809 Other disorders of plasma-protein metabolism, not elsewhere classified: Secondary | ICD-10-CM

## 2020-10-01 DIAGNOSIS — I480 Paroxysmal atrial fibrillation: Secondary | ICD-10-CM

## 2020-10-01 LAB — CBC WITH DIFFERENTIAL/PLATELET
Abs Immature Granulocytes: 0.03 10*3/uL (ref 0.00–0.07)
Basophils Absolute: 0 10*3/uL (ref 0.0–0.1)
Basophils Relative: 0 %
Eosinophils Absolute: 0.1 10*3/uL (ref 0.0–0.5)
Eosinophils Relative: 1 %
HCT: 31.3 % — ABNORMAL LOW (ref 39.0–52.0)
Hemoglobin: 10.1 g/dL — ABNORMAL LOW (ref 13.0–17.0)
Immature Granulocytes: 0 %
Lymphocytes Relative: 13 %
Lymphs Abs: 1 10*3/uL (ref 0.7–4.0)
MCH: 32.6 pg (ref 26.0–34.0)
MCHC: 32.3 g/dL (ref 30.0–36.0)
MCV: 101 fL — ABNORMAL HIGH (ref 80.0–100.0)
Monocytes Absolute: 0.5 10*3/uL (ref 0.1–1.0)
Monocytes Relative: 7 %
Neutro Abs: 5.6 10*3/uL (ref 1.7–7.7)
Neutrophils Relative %: 79 %
Platelets: 306 10*3/uL (ref 150–400)
RBC: 3.1 MIL/uL — ABNORMAL LOW (ref 4.22–5.81)
RDW: 16.4 % — ABNORMAL HIGH (ref 11.5–15.5)
WBC: 7.2 10*3/uL (ref 4.0–10.5)
nRBC: 0 % (ref 0.0–0.2)

## 2020-10-01 LAB — GLUCOSE, CAPILLARY
Glucose-Capillary: 104 mg/dL — ABNORMAL HIGH (ref 70–99)
Glucose-Capillary: 114 mg/dL — ABNORMAL HIGH (ref 70–99)
Glucose-Capillary: 89 mg/dL (ref 70–99)
Glucose-Capillary: 105 mg/dL — ABNORMAL HIGH (ref 70–99)

## 2020-10-01 LAB — COMPREHENSIVE METABOLIC PANEL
ALT: 32 U/L (ref 0–44)
AST: 21 U/L (ref 15–41)
Albumin: 2 g/dL — ABNORMAL LOW (ref 3.5–5.0)
Alkaline Phosphatase: 62 U/L (ref 38–126)
Anion gap: 7 (ref 5–15)
BUN: 6 mg/dL — ABNORMAL LOW (ref 8–23)
CO2: 30 mmol/L (ref 22–32)
Calcium: 8 mg/dL — ABNORMAL LOW (ref 8.9–10.3)
Chloride: 101 mmol/L (ref 98–111)
Creatinine, Ser: 0.98 mg/dL (ref 0.61–1.24)
GFR, Estimated: >60 mL/min (ref >60)
Glucose, Bld: 123 mg/dL — ABNORMAL HIGH (ref 70–99)
Potassium: 3.8 mmol/L (ref 3.5–5.1)
Sodium: 138 mmol/L (ref 135–145)
Total Bilirubin: 0.2 mg/dL — ABNORMAL LOW (ref 0.3–1.2)
Total Protein: 5.7 g/dL — ABNORMAL LOW (ref 6.5–8.1)

## 2020-10-01 MED ORDER — LIVING WELL WITH DIABETES BOOK
Freq: Once | Status: AC
  Filled 2020-10-01: qty 1

## 2020-10-01 MED ORDER — PROSOURCE PLUS PO LIQD
30.0000 mL | Freq: Two times a day (BID) | ORAL | Status: DC
  Administered 2020-10-01 – 2020-10-09 (×15): 30 mL via ORAL
  Filled 2020-10-01 (×17): qty 30

## 2020-10-01 NOTE — Progress Notes (Signed)
Pt refusing to keep bed in lowest position. Bed alarm on and activated. Discussed with pt, safety measures and reasons for bed to be in lowest position.   Sheela Stack, LPN

## 2020-10-01 NOTE — Progress Notes (Signed)
Physical Therapy Session Note  Patient Details  Name: Gregory [name] MRN: [id number] Date of Birth: [birth year]  Today's Date: 10/01/2020 PT Individual Time: 4174-0814 and 4818-5631 PT Individual Time Calculation (min): 56 min and 39 min  Short Term Goals: Week 1:  PT Short Term Goal 1 (Week 1): pt will transfer stand<>pivot with LRAD and CGA PT Short Term Goal 2 (Week 1): Pt will ambulate 4ft with LRAD CGA PT Short Term Goal 3 (Week 1): Pt will navigate 8 steps with CGA  Skilled Therapeutic Interventions/Progress Updates:   Treatment Session 1: 4970-2637 56 min Received pt supine in bed, pt agreeable to therapy, and reported pain 3/10 in low back but declined any pain medication until after therapy. Pt requested his heart medication prior to therapy and RN notified and present to administer medication during session. MD present for morning rounds. Session with emphasis on functional mobility/transfers, dressing generalized strengthening, dynamic standing balance/coordination, ambulation, and improved activity tolerance. Pt with no therapy schedule for today; this therapist provided pt with PT/OT schedule. Donned bilateral ted hose total A and performed bed mobility with supervision and use of bedrails and donned pants sitting EOB with min A and shoes with max A and transferred sit<>stand without AD and min A and able to pull pants over hips with CGA. Doffed dirty shirt and donned clean one with supervision and pt transferred bed<>WC stand<>pivot without AD and min A. Pt sat in [facility] and brushed teeth and washed face with supervision. Pt transported to therapy gym in [facility] total A for time management purposes and ambulated 41ft x 1 and 137ft x 1 with RW and CGA for balance. Pt demonstrated improved BOS and stride length using RW compared to not using any AD. Pt with SOB after ambulating, O2 sat 92% increasing to 96% in <30 seconds. Pt ambulated additional 58ft x 2 trials to/from staircase with  RW and CGA. Pt navigated 8 steps with 2 rails CGA ascending and descending with a step to and step through pattern. O2 sat 92% increasing to 98% with 1 minute seated rest break. Pt then stated "all right that's it for this session" and requested to return to room due to fatigue. Pt transported back to room in [facility] total A. Concluded session with pt sitting in WC, needs within reach, and chair pad alarm on.   Treatment Session 2: 8588-5027 39 min Received pt supine in bed, pt agreeable to therapy, and reported pain 3/10 in low back from lying in bed but declined any pain medication. Session with emphasis on functional mobility/transfers, dressing, generalized strengthening, dynamic standing balance/coordination, ambulation, and improved activity tolerance. Pt performed bed mobility with supervision and donned pants sitting EOB with supervision and shoes with max A. Pt transferred bed<>WC stand<>pivot without AD and CGA and changed shirts sitting in WC with supervision. Pt transported to dayroom in [facility] total A for energy conservation purposes and ambulated 92ft x 2 trials without AD with CGA/min A for balance with no LOB noted but pt reporting weakness. Pt transferred sit<>stand with bilateral UE support using WC armrests 2x3 reps with CGA for balance. Pt required multiple rest breaks throughout session and reported mild SOB with activity, O2 sat >92% and HR <88bpm throughout session. Worked on dynamic standing balance tapping LEs to 2 colored cones based off therapist's cues 1x8 reps with CGA/min A for balance. Pt then reported "that's it i'm done" and requested to be finished. With convincing, pt agreed to the following seated exercises: -hip flexion  1x10 bilaterally -LAQ 1x10 bilaterally Pt stated "that's it, I got nothing left" and requested to return to room. Pt transported back to room in [facility] total A. Concluded session with pt sitting in WC, needs within reach, and chair pad alarm on awaiting OT session.    Therapy Documentation Precautions:  Precautions Precautions: Fall Restrictions Weight Bearing Restrictions: No   Therapy/Group: Individual Therapy Alfonse Alpers PT, DPT   10/01/2020, 7:30 AM

## 2020-10-01 NOTE — Progress Notes (Signed)
Inpatient Rehabilitation  Patient information reviewed and entered into eRehab system by Gregory [name] Gregory [name], GregoryA., CCC/SLP, PPS Coordinator.  Information including medical coding, functional ability and quality indicators will be reviewed and updated through discharge.    

## 2020-10-01 NOTE — Progress Notes (Signed)
Patient Details  Name: Gregory [name] MRN: [id number] Date of Birth: 1961-09-13  Today's Date: 10/01/2020  Hospital Problems: Principal Problem:   Debility  Past Medical History:  Past Medical History:  Diagnosis Date  . CAD (coronary artery disease) 12/01/2013  . Chronic combined systolic and diastolic CHF, NYHA class 1 ([place]) 12/01/2013  . Erectile dysfunction 12/01/2013  . HTN (hypertension) 12/01/2013  . Hyperlipidemia 12/01/2013  . Ischemic cardiomyopathy 12/01/2013  . Sleep apnea    Past Surgical History:  Past Surgical History:  Procedure Laterality Date  . ACHILLES TENDON REPAIR     lft foot  . CARDIAC CATHETERIZATION N/A 05/05/2016   Procedure: Left Heart Cath and Coronary Angiography;  Surgeon: Leonie Man, MD;  Location: [place] [facility];  Service: Cardiovascular;  Laterality: N/A;  . CARDIAC DEFIBRILLATOR PLACEMENT    . CARDIOVERSION N/A 09/13/2020   Procedure: CARDIOVERSION;  Surgeon: Werner Lean, MD;  Location[facility] ENDOSCOPY;  Service: Cardiovascular;  Laterality: N/A;  . CARDIOVERSION N/A 09/19/2020   Procedure: CARDIOVERSION;  Surgeon: [name], MD;  Location: [name] ENDOSCOPY;  Service: Cardiovascular;  Laterality: N/A;  . EP IMPLANTABLE DEVICE N/A 12/19/2016   Procedure: ICD Generator Changeout;  Surgeon: [name], MD;  Location: [place] [facility];  Service: Cardiovascular;  Laterality: N/A;  . RIGHT/LEFT HEART CATH AND CORONARY ANGIOGRAPHY N/A 09/24/2020   Procedure: RIGHT/LEFT HEART CATH AND CORONARY ANGIOGRAPHY;  Surgeon: Lorretta Harp, MD;  Location: [place] [facility];  Service: Cardiovascular;  Laterality: N/A;  . WRIST TENODESIS     Social History:  reports that he quit smoking about 7 years ago. His smoking use included cigarettes. He smoked 0.00 packs per day for 30.00 years. He has never used smokeless tobacco. He reports that he does not drink alcohol and does not use drugs.  Family / Support Systems Marital  Status: Single Patient Roles: Parent, Other (Comment) (retiree) Children: Aahan-son [phone]  Jessica-daughter [phone]-cell  Anisha-daughter Other Supports: Pearl-mom [phone] Anticipated Caregiver: Keigan-son Ability/Limitations of Caregiver: Works from home and can assist if needed Caregiver Availability: 24/7 Family Dynamics: Close knit family who look out for one another. Pt is very much active and independent and does not like to depend upon others or ask for help. 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 He is walking well with a rolling walker and hopes to leave here with out a device. He has always been independent and is quite stubborn regarding this. Recent Psychosocial Issues: other health issues-mostly cardiac issues Psychiatric History: No issues-deferred depression screen due to coping appropriately and feels he can handle whatever comes his way due to his previous job and his personality Substance Abuse History: No issues-remote smoking  Patient / Family Perceptions, Expectations & Goals Pt/Family understanding of illness & functional limitations: Pt and son can explain his condition and reason for being in the hospital and now on rehab. He feels he is getting stronger daily and  will  be home soon. He does talk with the MD and feels he has a good understanding of his treatment plan going forward. Premorbid pt/family roles/activities: father, retiree, friend, son, etc Anticipated changes in roles/activities/participation: resume Pt/family expectations/goals: Pt states: " I want to leave here without a walker or device."  Son states: " He is stubborn and will do well here."  [employer]: None Premorbid Home Care/DME Agencies: None Transportation available at discharge: Self or his family  Discharge Planning Living Arrangements: Children Support Systems: Children, Parent, Other relatives, Friends/neighbors Type of Residence: Private residence Insurance Resources: Multimedia programmer (specify) (Workers Comp if has to do with his heart) Museum/gallery curator Resources: Other (Comment), Family Support (pension) Financial Screen Referred: No Living Expenses: Lives with family Money Management: Patient Does the patient have any problems obtaining your medications?: No Home Management: All participate in this son, pt and daughter Patient/Family Preliminary Plans: Return home with son along with daughter who lives there also. Pt was independent prior to admission and went to the gym daily and wants to get ack to this once regains his strength. Made aware of team conference Wed and will work on discharge needs. Care Coordinator Barriers to Discharge: Other (comments) Care Coordinator Barriers to Discharge Comments: Workers Comp only covers when heart related otherwise he is a self pay Care Coordinator Anticipated Follow Up Needs: HH/OP  Clinical Impression Pleasant extremely independent gentleman who is planning on being independent with no device upon discharge from here. His son is home with him if needed-works from home. Will work on discharge needs and follow along,pt will probably be a short stay here.  Elease Hashimoto 10/01/2020, 11:52 AM

## 2020-10-01 NOTE — Progress Notes (Signed)
Occupational Therapy Session Note  Patient Details  Name: Gregory [name] MRN: [id number] Date of Birth: 11/23/1961  Today's Date: 10/01/2020 OT Individual Time: 1000-1055 and 1415-1455 OT Individual Time Calculation (min): 55 min and 40 min   Short Term Goals: Week 1:  OT Short Term Goal 1 (Week 1): LTG=STG  mod I overall  Skilled Therapeutic Interventions/Progress Updates:    1) Treatment session with focus on self-care retraining with functional transfers, sit > stand, standing balance, and endurance.  Pt received upright in w/c requesting to shower this session.  Pt with c/o pain 2-3/10 in lower back from hospital bed.  RN aware.  Pt ambulated with CGA with RW in to bathroom and transferred to toilet with CGA.  Pt completed toileting task and doffed clothing while seated on toilet.  Ambulated to tub bench in shower with RW with CGA.  Completed bathing overall supervision with exception of therapist washing buttocks while pt maintained standing.  Pt returned to sitting EOB for dressing.  Pt completed all dressing with setup and CGA for standing balance when pulling underwear and pants over hips.  Pt required assistance with donning Lt shoe, reports difficulty with Lt shoe PTA.  Discussed attempting various AE during next therapy session with pt in agreement. Pt reporting fatigue and requesting to return to supine.  Completed bed mobility supervision and remained semi-reclined with all needs in reach.  2) Treatment session with focus on functional transfers, dynamic standing balance, and use of AE for LB dressing. Pt received upright in w/c reporting need to toilet.  Pt reports legs feeling tired from PT session but agreeable to ambulating to toilet.  Close supervision with RW, except CGA when navigating threshold in to bathroom, transfer to toilet.  Therapist providing CGA during dynamic standing balance with clothing management pre and post toileting.  Pt required increased time to  attempt BM, with no success.  Pt ambulated back to EOB due to fatigue.  Pt agreeable to attempting AE to increase independence with donning Lt shoe.  Educated on use of shoe horn vs shoe funnel with pt able to ultimately utilize shoe funnel to don shoe without assistance.  Discussed home setup and possible areas of concern.  Pt reports that his daughter is obtaining an elevated commode seat and installing a rail next to toilet.  Also discussed tub/shower and possible need for shower seat vs tub bench. Pt reports pain in lower back 8/10, RN provided pain meds.  Pt returned to supine with supervision and left semi-reclined in bed with all needs in reach.  Therapy Documentation Precautions:  Precautions Precautions: Fall Restrictions Weight Bearing Restrictions: No    Therapy/Group: Individual Therapy  [name] 10/01/2020, 12:18 PM

## 2020-10-01 NOTE — Progress Notes (Signed)
[place] PHYSICAL MEDICINE & REHABILITATION PROGRESS NOTE   Subjective/Complaints: Patient seen sitting up at the edge of his bed this morning.  He states he slept well overnight.  Good sitting balance noted.  He has questions regarding long-term need for Levemir.  He states therapies are going well, confirmed with therapies. . Review of systems: Denies CP, SOB, N/V/D  Objective:   No results found. Recent Labs    10/01/20 0626  WBC 7.2  HGB 10.1*  HCT 31.3*  PLT 306   Recent Labs    10/01/20 0626  NA 138  K 3.8  CL 101  CO2 30  GLUCOSE 123*  BUN 6*  CREATININE 0.98  CALCIUM 8.0*    Intake/Output Summary (Last 24 hours) at 10/01/2020 1436 Last data filed at 10/01/2020 1300 Gross per 24 hour  Intake 440 ml  Output 1125 ml  Net -685 ml        Physical Exam: Vital Signs Blood pressure 111/82, pulse 73, temperature 97.8 F (36.6 C), resp. rate 18, height 6\' 6"  (1.981 m), weight 133.3 kg, SpO2 95 %. Constitutional: No distress . Vital signs reviewed. HENT: Normocephalic.  Atraumatic. Eyes: EOMI. No discharge. Cardiovascular: No JVD.  RRR. Respiratory: Normal effort.  No stridor.  Bilateral clear to auscultation. GI: Non-distended.  BS +. Skin: Warm and dry.  Intact. Psych: Normal mood.  Normal behavior. Musc: Bilateral lower extremity edema.  No tenderness in extremities. Neuro: Alert Motor: Bilateral upper extremities: 5/5 proximal distal Bilateral lower extremities: In flexion, knee extension 4-4+/5, ankle dorsiflexion 5/5   Assessment/Plan: 1. Functional deficits secondary to debility  which require 3+ hours per day of interdisciplinary therapy in a comprehensive inpatient rehab setting.  Physiatrist is providing close team supervision and 24 hour management of active medical problems listed below.  Physiatrist and rehab team continue to assess barriers to discharge/monitor patient progress toward functional and medical goals  Care Tool:  Bathing     Body parts bathed by patient: Right arm, Left arm, Chest, Abdomen, Front perineal area, Right upper leg, Left upper leg, Face   Body parts bathed by helper: Buttocks, Right lower leg, Left lower leg     Bathing assist Assist Level: Minimal Assistance - Patient > 75%     Upper Body Dressing/Undressing Upper body dressing   What is the patient wearing?: Pull over shirt    Upper body assist Assist Level: Set up assist    Lower Body Dressing/Undressing Lower body dressing      What is the patient wearing?: Underwear/pull up, Pants     Lower body assist Assist for lower body dressing: Contact Guard/Touching assist     Toileting Toileting    Toileting assist Assist for toileting: Contact Guard/Touching assist     Transfers Chair/bed transfer  Transfers assist     Chair/bed transfer assist level: Contact Guard/Touching assist     Locomotion Ambulation   Ambulation assist      Assist level: Contact Guard/Touching assist Assistive device: Walker-rolling Max distance: 120ft   Walk 10 feet activity   Assist     Assist level: Contact Guard/Touching assist Assistive device: Walker-rolling   Walk 50 feet activity   Assist Walk 50 feet with 2 turns activity did not occur: Safety/medical concerns (fatigue, LE weakness, decreased balance/postural control)  Assist level: Contact Guard/Touching assist Assistive device: Walker-rolling    Walk 150 feet activity   Assist Walk 150 feet activity did not occur: Safety/medical concerns (fatigue, LE weakness, decreased balance/postural control)  Walk 10 feet on uneven surface  activity   Assist Walk 10 feet on uneven surfaces activity did not occur: Safety/medical concerns (fatigue, LE weakness, decreased balance/postural control)         Wheelchair     Assist Will patient use wheelchair at discharge?: No Type of Wheelchair: Manual    Wheelchair assist level: Supervision/Verbal  cueing Max wheelchair distance: 51ft    Wheelchair 50 feet with 2 turns activity    Assist        Assist Level: Supervision/Verbal cueing   Wheelchair 150 feet activity     Assist      Assist Level: Maximal Assistance - Patient 25 - 49%    Medical Problem List and Plan: 1.Debilitysecondary torenal failure and  atrial fibrillation with RVR Acute encephalopathy/sepsis/multimedical.  Continue CIR  2. Antithrombotics: -DVT/anticoagulation:Eliquis -antiplatelet therapy: Plavix 75 mg daily 3. Pain Management:Tramadol as needed 4. Mood:Provide emotional support -antipsychotic agents: N/A 5. Neuropsych: This patientiscapable of making decisions on hisown behalf. 6. Skin/Wound Care:Routine skin checks 7. Fluids/Electrolytes/Nutrition:Routine in and outs 8. Diastolic congestive heart failure. ContinueLasix 40 mg twice daily, Coreg CR80 mg daily, Entresto 97-103 mg twice daily. Follow-up cardiology service. Monitor for any signs of fluid overload Filed Weights   09/29/20 1524 09/30/20 0500  Weight: 135.7 kg 133.3 kg   Stable on 10/18 9. A. fib with RVR. Status post DCCV 09/19/2020. Continue amiodarone as well as carvedilol controlled release to 80 mg/day Vitals:   10/01/20 0442 10/01/20 1411  BP: 111/79 111/82  Pulse: 70 73  Resp: 18 18  Temp: 97.6 F (36.4 C) 97.8 F (36.6 C)  SpO2: 94% 95%   Rate controlled on 10/18 10. History of seizure disorder. Keppra 500 mg twice daily  No breakthrough seizures since admission to rehab 11. New onset diabetes mellitus. Hemoglobin A1c 6.9 on 10/8. Levemir 5 units twice daily. Diabetic teaching CBG (last 3)  Recent Labs    09/30/20 1735 10/01/20 0655 10/01/20 1132  GLUCAP 103* 104* 114*   Plan to transition to oral medication  12. Hyperlipidemia.  Lipitor 13. Constipation. MiraLAX twice daily, Colace twice daily 14.  Mild hypoalbuminemia  Supplement initiated on  10/18 15.  Acute blood loss anemia  Hemoglobin 10.1 on 10/18  Continue to monitor     LOS: 2 days A FACE TO FACE EVALUATION WAS PERFORMED  Arriyah Madej Lorie Phenix 10/01/2020, 2:36 PM

## 2020-10-01 NOTE — Progress Notes (Signed)
Pt states he will sewlf administer cpap when ready

## 2020-10-01 NOTE — IPOC Note (Signed)
Individualized overall Plan of Care [facility]) Patient Details Name: Gregory [name] MRN: [id number] DOB: 01/12/1961  Admitting Diagnosis: [facility] Problems: Principal Problem:   Debility Active Problems:   Acute blood loss anemia   Hypoalbuminemia due to protein-calorie malnutrition (HCC)   Diabetes mellitus, new onset ([place])   Seizures (HCC)   Paroxysmal atrial fibrillation (HCC)     Functional Problem List: Nursing Endurance, Pain, Safety  PT Balance, Endurance, Motor  OT Balance, Endurance, Motor  SLP    TR         Basic ADLs: OT Bathing, Dressing, Toileting     Advanced  ADLs: OT       Transfers: PT Bed Mobility, Bed to Chair, Car, Manufacturing systems engineer, Metallurgist: PT Ambulation, Emergency planning/management officer, Stairs     Additional Impairments: OT None  SLP        TR      Anticipated Outcomes Item Anticipated Outcome  Self Feeding n/A  Swallowing      Basic self-care  MOD I  Toileting  mod I   Bathroom Transfers mod i  Bowel/Bladder  Patient will continue to be continent of bowel and bladder during admission  Transfers  mod I with LRAD  Locomotion  supervision with LRAD  Communication     Cognition     Pain  Patient will be free from pain or pain less than 3 during admission  Safety/Judgment  Patient will be free from falls and adhere to safety plan during admission   Therapy Plan: PT Intensity: Minimum of 1-2 x/day ,45 to 90 minutes PT Frequency: 5 out of 7 days PT Duration Estimated Length of Stay: 10-14 days OT Intensity: Minimum of 1-2 x/day, 45 to 90 minutes OT Frequency: 5 out of 7 days OT Duration/Estimated Length of Stay: 7-9 days      Team Interventions: Nursing Interventions Patient/Family Education, Disease Management/Prevention, Pain Management  PT interventions Ambulation/gait training, Discharge planning, Functional mobility training, Psychosocial support, Therapeutic Activities,  Balance/vestibular training, Disease management/prevention, Neuromuscular re-education, Skin care/wound management, Therapeutic Exercise, Wheelchair propulsion/positioning, Cognitive remediation/compensation, DME/adaptive equipment instruction, Pain management, Splinting/orthotics, UE/LE Strength taining/ROM, Community reintegration, Technical sales engineer stimulation, Patient/family education, IT trainer, UE/LE Coordination activities  OT Interventions Training and development officer, Discharge planning, Pain management, Self Care/advanced ADL retraining, Therapeutic Activities, UE/LE Coordination activities, Disease mangement/prevention, Functional mobility training, Patient/family education, Skin care/wound managment, Therapeutic Exercise, DME/adaptive equipment instruction, Community reintegration, Neuromuscular re-education, Psychosocial support, UE/LE Strength taining/ROM, Splinting/orthotics  SLP Interventions    TR Interventions    SW/CM Interventions Discharge Planning, Psychosocial Support, Patient/Family Education   Barriers to Discharge MD  Medical stability and Weight  Nursing      PT Inaccessible home environment, Home environment access/layout 17 steps to get to bedroom/bathroom  OT      SLP      SW Other (comments) Workers Comp only covers when 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 Medical Rehab Prognosis:  Excellent Assessment: 59 year old right-handedmale with history of hyperlipidemia, seizure disorder maintained on Keppra, CAD, diastolic congestive heart failure maintained on Entresto, ischemic cardiomyopathy, VTstatus post  AICD maintained on Plavix, quit smoking 7  years ago. Presented 09/09/2020 with generalized weakness and dizziness in the setting of multiple days of diarrhea. Denied any nausea vomiting or abdominal pain. Ultrasound the abdomen showed cholelithiasis without evidence of acute cholecystitis and fatty liver. Chest x-ray negative. Admission chemistry sodium 130, potassium 3.1, chloride 95, glucose 189, BUN 55, creatinine 5.60, calcium 5.2, AST 88, lactic acid 3.2, troponin 48, magnesium 1.6, urine culture no growth. Echocardiogram with ejection fraction of 55 to 60% no wall motion abnormalities. 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 Alvester Chou for follow-up showed no obstructive disease.Patient did experience some altered mental status cranial CT scan completed which was reviewed by neurology services no acute process noted. He was placed empirically on IV Rocephin for question UTI latest cultures negative. Follow-up cardiology services for acute on chronic diastolic congestive heart failure and patient has undergone diuresis. Patient with persistent elevations in glucose 186-237 and hemoglobin A1c6.9currently maintained on sliding scale insulin as well as scheduled Levemir 5 units twice daily. Patient remains on [place] for history of seizure disorder. AKI likely prerenal in the setting of A. fib with RVR placed on gentle IV fluids. Tolerating a regular diet.  Patient with resulting functional deficits with mobility, endurance, and balance.  We will set goals for mod I with PT/OT.  Due to the current state of emergency, patients may not be receiving their  3-hours of Medicare-mandated therapy.  See Team Conference Notes for weekly updates to the plan of care

## 2020-10-01 NOTE — Progress Notes (Signed)
Living well with Diabetes book and Living Better with Heart Failure book given to patient. Educated on importance of carb counting, utilizing less sodium/sugar, and monitoring for s/s of heart failure. Pt has no further questions at this time.  Gregory Stack, LPN

## 2020-10-01 NOTE — Progress Notes (Signed)
[facility] Individual Statement of Services  Patient Name:  Jlynn Langille  Date:  10/01/2020  Welcome to the [facility].  Our goal is to provide you with an individualized program based on your diagnosis and situation, designed to meet your specific needs.  With this comprehensive rehabilitation program, you will be expected to participate in at least 3 hours of rehabilitation therapies Monday-Friday, with modified therapy programming on the weekends.  Your rehabilitation program will include the following services:  Physical Therapy (PT), Occupational Therapy (OT), 24 hour per day rehabilitation nursing, Neuropsychology, Care Coordinator, Rehabilitation Medicine, Nutrition Services and Pharmacy Services  Weekly team conferences will be held on Wednesday to discuss your progress.  Your Inpatient Rehabilitation Care Coordinator will talk with you frequently to get your input and to update you on team discussions.  Team conferences with you and your family in attendance may also be held.  Expected length of stay: 10-12 days  Overall anticipated outcome: supervision level  Depending on your progress and recovery, your program may change. Your Inpatient Rehabilitation Care Coordinator will coordinate services and will keep you informed of any changes. Your Inpatient Rehabilitation Care Coordinator's name and contact numbers are listed  below.  The following services may also be recommended but are not provided by the [facility] will be made to provide these services after discharge if needed.  Arrangements include referral to agencies that provide these services.  Your insurance has been verified to be:  Workers Comp versus Self Pay Your primary doctor is: None   Pertinent  information will be shared with your doctor and your insurance company.  Inpatient Rehabilitation Care Coordinator:  Ovidio Kin, [place] or Emilia Beck  Information discussed with and copy given to patient by: Elease Hashimoto, 10/01/2020, 9:54 AM

## 2020-10-02 ENCOUNTER — Inpatient Hospital Stay (HOSPITAL_COMMUNITY): Payer: Self-pay | Admitting: Occupational Therapy

## 2020-10-02 ENCOUNTER — Inpatient Hospital Stay (HOSPITAL_COMMUNITY): Payer: Self-pay

## 2020-10-02 LAB — GLUCOSE, CAPILLARY
Glucose-Capillary: 103 mg/dL — ABNORMAL HIGH (ref 70–99)
Glucose-Capillary: 113 mg/dL — ABNORMAL HIGH (ref 70–99)
Glucose-Capillary: 92 mg/dL (ref 70–99)
Glucose-Capillary: 128 mg/dL — ABNORMAL HIGH (ref 70–99)

## 2020-10-02 NOTE — Plan of Care (Signed)
Nutrition Education Note  RD consulted for nutrition education regarding diabetes. RD working remotely. Attempted to speak with pt via phone call to room x 2; however, no answer. RD will attempt to follow up with pt in person as schedule allows.  Lab Results  Component Value Date   HGBA1C 6.9 (H) 09/21/2020    RD has attached "Carbohydrate Counting for People with Diabetes" handout from the Academy of Nutrition and Dietetics to pt's AVS/Discharge Instructions. Handout discusses different food groups and their effects on blood sugar, emphasizing carbohydrate-containing foods. Handout contains a list of carbohydrates and recommended serving sizes of common foods.  Handout discusses importance of controlled and consistent carbohydrate intake throughout the day. Handout provides examples of ways to balance meals/snacks and encouraged intake of high-fiber, whole grain complex carbohydrates.   Body mass index is 33.96 kg/m. Pt meets criteria for obesity class I based on current BMI.  Current diet order is Carb Modified, patient is consuming approximately 100% of meals at this time. Labs and medications reviewed. No further nutrition interventions warranted at this time. RD contact information provided. If additional nutrition issues arise, please re-consult RD.   Gaynell Face, MS, RD, LDN Inpatient Clinical Dietitian Please see AMiON for contact information.

## 2020-10-02 NOTE — Progress Notes (Signed)
[facility] PHYSICAL MEDICINE & REHABILITATION PROGRESS NOTE   Subjective/Complaints: Patient seen sitting up in bed this morning eating breakfast.  He states he slept well overnight.  Dates he is trying to eat past taking finish his food before therapies arrive. . Review of systems: Denies CP, SOB, N/V/D  Objective:   No results found. Recent Labs    10/01/20 0626  WBC 7.2  HGB 10.1*  HCT 31.3*  PLT 306   Recent Labs    10/01/20 0626  NA 138  K 3.8  CL 101  CO2 30  GLUCOSE 123*  BUN 6*  CREATININE 0.98  CALCIUM 8.0*    Intake/Output Summary (Last 24 hours) at 10/02/2020 0948 Last data filed at 10/02/2020 0650 Gross per 24 hour  Intake 360 ml  Output 2250 ml  Net -1890 ml        Physical Exam: Vital Signs Blood pressure 118/82, pulse 71, temperature 98.5 F (36.9 C), temperature source Oral, resp. rate 18, height 6\' 6"  (1.981 m), weight 133.3 kg, SpO2 93 %. Constitutional: No distress . Vital signs reviewed. HENT: Normocephalic.  Atraumatic. Eyes: EOMI. No discharge. Cardiovascular: No JVD.  RRR. Respiratory: Normal effort.  No stridor.  Bilateral clear to auscultation. GI: Non-distended.  BS +. Skin: Warm and dry.  Intact. Psych: Normal mood.  Normal behavior. Musc: No edema in extremities.  No tenderness in extremities. Neuro: Alert Motor: Bilateral upper extremities: 5/5 proximal distal Bilateral lower extremities: In flexion, knee extension 4-4+/5, ankle dorsiflexion 5/5, stable  Assessment/Plan: 1. Functional deficits secondary to debility  which require 3+ hours per day of interdisciplinary therapy in a comprehensive inpatient rehab setting.  Physiatrist is providing close team supervision and 24 hour management of active medical problems listed below.  Physiatrist and rehab team continue to assess barriers to discharge/monitor patient progress toward functional and medical goals  Care Tool:  Bathing    Body parts bathed by patient: Right  arm, Left arm, Chest, Abdomen, Front perineal area, Right upper leg, Left upper leg, Face   Body parts bathed by helper: Buttocks, Right lower leg, Left lower leg     Bathing assist Assist Level: Minimal Assistance - Patient > 75%     Upper Body Dressing/Undressing Upper body dressing   What is the patient wearing?: Pull over shirt    Upper body assist Assist Level: Set up assist    Lower Body Dressing/Undressing Lower body dressing      What is the patient wearing?: Underwear/pull up, Pants     Lower body assist Assist for lower body dressing: Contact Guard/Touching assist     Toileting Toileting    Toileting assist Assist for toileting: Contact Guard/Touching assist     Transfers Chair/bed transfer  Transfers assist     Chair/bed transfer assist level: Contact Guard/Touching assist     Locomotion Ambulation   Ambulation assist      Assist level: Contact Guard/Touching assist Assistive device: Walker-rolling Max distance: 118ft   Walk 10 feet activity   Assist     Assist level: Contact Guard/Touching assist Assistive device: Walker-rolling   Walk 50 feet activity   Assist Walk 50 feet with 2 turns activity did not occur: Safety/medical concerns (fatigue, LE weakness, decreased balance/postural control)  Assist level: Contact Guard/Touching assist Assistive device: Walker-rolling    Walk 150 feet activity   Assist Walk 150 feet activity did not occur: Safety/medical concerns (fatigue, LE weakness, decreased balance/postural control)         Walk  10 feet on uneven surface  activity   Assist Walk 10 feet on uneven surfaces activity did not occur: Safety/medical concerns (fatigue, LE weakness, decreased balance/postural control)         Wheelchair     Assist Will patient use wheelchair at discharge?: No Type of Wheelchair: Manual    Wheelchair assist level: Supervision/Verbal cueing Max wheelchair distance: 64ft     Wheelchair 50 feet with 2 turns activity    Assist        Assist Level: Supervision/Verbal cueing   Wheelchair 150 feet activity     Assist      Assist Level: Maximal Assistance - Patient 25 - 49%    Medical Problem List and Plan: 1.Debilitysecondary torenal failure and  atrial fibrillation with RVR Acute encephalopathy/sepsis/multimedical.  Continue CIR  2. Antithrombotics: -DVT/anticoagulation:Eliquis -antiplatelet therapy: Plavix 75 mg daily 3. Pain Management:Tramadol as needed 4. Mood:Provide emotional support -antipsychotic agents: N/A 5. Neuropsych: This patientiscapable of making decisions on hisown behalf. 6. Skin/Wound Care:Routine skin checks 7. Fluids/Electrolytes/Nutrition:Routine in and outs 8. Diastolic congestive heart failure. ContinueLasix 40 mg twice daily, Coreg CR80 mg daily, Entresto 97-103 mg twice daily. Follow-up cardiology service. Monitor for any signs of fluid overload  Daily weights ordered Filed Weights   09/29/20 1524 09/30/20 0500  Weight: 135.7 kg 133.3 kg   Stable on 10/17, no weights since that time 9. A. fib with RVR. Status post DCCV 09/19/2020. Continue amiodarone as well as carvedilol controlled release to 80 mg/day Vitals:   10/01/20 1920 10/02/20 0619  BP: 109/77 118/82  Pulse: 70 71  Resp: 16 18  Temp: 98.7 F (37.1 C) 98.5 F (36.9 C)  SpO2: 95% 93%   Rate controlled on 10/19 10. History of seizure disorder. Keppra 500 mg twice daily  No breakthrough seizures from admission to rehab-10/19 11. New onset diabetes mellitus. Hemoglobin A1c 6.9 on 10/8.   Levemir 5 units twice daily DC'd on 10/19.   Diabetic teaching CBG (last 3)  Recent Labs    10/01/20 1630 10/01/20 2115 10/02/20 0617  GLUCAP 89 105* 103*   Will await 2 days prior to transitioning to oral medication 12. Hyperlipidemia.  Lipitor 13. Constipation. MiraLAX twice daily, Colace twice  daily 14.  Mild hypoalbuminemia  Supplement initiated on 10/18 15.  Acute blood loss anemia  Hemoglobin 10.1 on 10/18  Continue to monitor     LOS: 3 days A FACE TO FACE EVALUATION WAS PERFORMED  Marnie Fazzino Lorie Phenix 10/02/2020, 9:48 AM

## 2020-10-02 NOTE — Progress Notes (Signed)
Physical Therapy Session Note  Patient Details  Name: Gregory [name] MRN: [id number] Date of Birth: Nov 23, 1961  Today's Date: 10/02/2020 PT Individual Time: 4709-6283 and 1445-1526 PT Individual Time Calculation (min): 57 min and 41 min   Short Term Goals: Week 1:  PT Short Term Goal 1 (Week 1): pt will transfer stand<>pivot with LRAD and CGA PT Short Term Goal 2 (Week 1): Pt will ambulate 34ft with LRAD CGA PT Short Term Goal 3 (Week 1): Pt will navigate 8 steps with CGA  Skilled Therapeutic Interventions/Progress Updates:   Treatment Session 1: 6629-4765 57 min Received pt supine in bed with RN present administering medications, pt agreeable to therapy, and reported pain 1/10 in low back from laying in bed; pt declined any pain interventions. Session with emphasis on functional mobility/transfers, dressing, dynamic standing balance/coordination, generalized strengthening, NMR, stair navigation, and improved activity tolerance. Donned bilateral ted hose total A and pt performed bed mobility with supervision and donned pants and clean shirt sitting EOB with supervision and donned shoes with max A for time management purposes. Pt transferred sit<>stand with CGA and able to pull pants over hips with CGA. Stand<>pivot bed<>WC without AD and CGA. Pt sat in [facility] and brushed teeth and washed face at sink with supervision. Pt transported to therapy gym in [facility] total A for time management purposes and navigated 4 steps with 1 rail using a lateral stepping technique and min A with cues for technique. HR 92bpm after activity and pt required seated rest break. Pt reported his legs felt "heavy" and he was worn out after yesterday's therapy. On [employer] pt worked on dynamic standing balance for the following activities and time frames: -Postural stability training for 1.5 minutes on level 10 with bilateral UE support and CGA -Weight shift training for 1.5 minutes on level static with bilateral UE support  and CGA -Limits of stability training for 1 minute 12 seconds on level static with bilateral UE support and CGA Pt declined ambulating this morning due to 7/10 fatigue and weakness in LEs but agreeable to UE exercises. Pt performed the following exercises sitting in [facility] with supervision and verbal cues for technique: -overhead chest press with 10lb dowel 2x8 -bicep curls with 10lb dowel 2x15 Pt transported back to room in [facility] total A. Concluded session with pt sitting in WC, needs within reach, and chair pad alarm on. Therapist provided ice for pt.   Treatment Session 2: 1445-1526 41 min Received pt sitting in WC, pt agreeable to therapy, and reported mild pain in low back but did not rate pain number. Therapist applied topical cream to low back for pain relief. Session with emphasis on functional mobility/transfers, toileting, dynamic standing balance/coordination, generalized strengthening, gait training, and improved activity tolerance. Pt reported urge to have BM and ambulated 45ft x 2 trials to/from bathroom without AD and CGA with cues to navigate threshold. Pt able to manage clothes with CGA. Pt able to void and with small BM and able to perform peri-care standing with CGA. Pt sat in [place] and washed hands with supervision. Pt transported to dayroom in [facility] total A for time management purposes and ambulated 37ft without AD and CGA for balance. Pt reported fatigue and requested to turn around. HR 76bpm. Pt ambulated additional 118ft with RW and CGA/close supervision with no LOB noted. HR 90bpm after activity. Pt performed TUG without AD with CGA x 2 trials in 19 seconds and 18 seconds. Pt educated on test results and significance indicating risk of  falls; pt verbalized understanding. Pt transported back to room in [facility] total A and transferred stand<>pivot WC<>bed without AD CGA and doffed pants with supervision and ted hose and shoes with max A. Pt transferred sit<>supine with supervision. Concluded session  with pt supine in bed, needs within reach, and bed alarm on. RN aware of pt's request for medication.   Therapy Documentation Precautions:  Precautions Precautions: Fall Restrictions Weight Bearing Restrictions: No  Therapy/Group: Individual Therapy Alfonse Alpers PT, DPT   10/02/2020, 7:18 AM

## 2020-10-02 NOTE — Progress Notes (Signed)
Occupational Therapy Session Note  Patient Details  Name: Gregory [name] MRN: [id number] Date of Birth: 15-Jan-1961  Today's Date: 10/02/2020 OT Individual Time: 2979-8921 and [id number] OT Individual Time Calculation (min): 54 min and 40 min   Short Term Goals: Week 1:  OT Short Term Goal 1 (Week 1): LTG=STG  mod I overall  Skilled Therapeutic Interventions/Progress Updates:    1) Treatment session with focus on functional mobility, dynamic standing balance, and endurance during self- care tasks of bathing and dressing.  Pt received upright in w/c reporting desire to shower.  Pt ambulated to toilet with RW with CGA and completed BM on toilet with increased time.  Pt able to complete sit > stand from toilet with use of grab bar and CGA to complete hygiene with supervision.  Pt ambulated to tub bench in room shower with RW with close supervision.  Pt completed bathing with exception of Lt foot.  Pt demonstrated ability to wash buttocks in standing this session.  Pt ambulated back to EOB with RW with close supervision.  Pt completed dressing at sit > stand level, opting to leave compression socks off at this time.  Pt reports fatigue and "burning" in thighs due to activity during PT session earlier in AM.  Pt returned to supine and left semi-reclined with all needs in reach.  2) Treatment session with focus on functional mobility, transfers, dynamic standing balance, and activity tolerance.  Pt received supine in bed agreeable to therapy session.  Pt completed bed mobility supervision.  Therapist assisted with donning TEDS, pt then able to don shoes with use of shoe funnel and good carryover of education from previous session.  Pt reports needing to toilet.  Completed sit > stand from lower bed with supervision.  Ambulated with RW to toilet and completed toilet transfer with RW with supervision.  Pt with no success on toilet, able to complete sit > stand from standard commode with grab bar  with CGA.  Engaged in short functional ambulation with RW with CGA to close supervision to/from toilet and to/from therapy mat.  Engaged in sit > stand from elevated mat and progressively lowering during session.  Pt completed all sit <> stand with supervision with RW.  Engaged in bean bag toss to address dynamic standing balance while reaching behind self on Rt and Lt to obtain bean bags.  Pt reports fatigue during bean bag toss, requiring frequent seated rest breaks.  Pt returned to room and remained upright in w/c with chair alarm on and all needs in reach.  Therapy Documentation Precautions:  Precautions Precautions: Fall Restrictions Weight Bearing Restrictions: No Pain:  Pt with c/o pain in lower back, RN aware.   Therapy/Group: Individual Therapy  [name] 10/02/2020, 12:13 PM

## 2020-10-03 ENCOUNTER — Inpatient Hospital Stay (HOSPITAL_COMMUNITY): Payer: Self-pay | Admitting: Physical Therapy

## 2020-10-03 ENCOUNTER — Inpatient Hospital Stay (HOSPITAL_COMMUNITY): Payer: Self-pay

## 2020-10-03 ENCOUNTER — Inpatient Hospital Stay (HOSPITAL_COMMUNITY): Payer: Self-pay | Admitting: Occupational Therapy

## 2020-10-03 DIAGNOSIS — G479 Sleep disorder, unspecified: Secondary | ICD-10-CM

## 2020-10-03 DIAGNOSIS — D179 Benign lipomatous neoplasm, unspecified: Secondary | ICD-10-CM

## 2020-10-03 DIAGNOSIS — D1779 Benign lipomatous neoplasm of other sites: Secondary | ICD-10-CM

## 2020-10-03 DIAGNOSIS — K5901 Slow transit constipation: Secondary | ICD-10-CM

## 2020-10-03 LAB — GLUCOSE, CAPILLARY
Glucose-Capillary: 110 mg/dL — ABNORMAL HIGH (ref 70–99)
Glucose-Capillary: 121 mg/dL — ABNORMAL HIGH (ref 70–99)
Glucose-Capillary: 108 mg/dL — ABNORMAL HIGH (ref 70–99)
Glucose-Capillary: 134 mg/dL — ABNORMAL HIGH (ref 70–99)

## 2020-10-03 MED ORDER — NON FORMULARY: 1.5000 mg | Freq: Every day | Status: DC

## 2020-10-03 MED ORDER — MELATONIN 3 MG PO TABS
1.5000 mg | ORAL_TABLET | Freq: Every day | ORAL | Status: DC
  Administered 2020-10-03 – 2020-10-04 (×2): 1.5 mg via ORAL
  Filled 2020-10-03 (×2): qty 1

## 2020-10-03 MED ORDER — SORBITOL 70 % SOLN: 30.0000 mL | Freq: Every day | Status: DC | PRN

## 2020-10-03 MED ORDER — MELATONIN 3 MG PO TABS: 3.0000 mg | ORAL_TABLET | Freq: Every day | ORAL | Status: DC

## 2020-10-03 NOTE — Patient Care Conference (Signed)
Inpatient RehabilitationTeam Conference and Plan of Care Update Date: 10/03/2020   Time: 11:41  AM    Patient Name: Gregory [name]      Medical Record Number: [id number]  Date of Birth: [birth year] Sex: Male         Room/Bed: 4W08C/4W08C-01 Payor Info: Payor: /    Admit Date/Time:  09/29/2020  2:01 PM  Primary Diagnosis:  [facility] Problems: Principal Problem:   Debility Active Problems:   Acute blood loss anemia   Hypoalbuminemia due to protein-calorie malnutrition (HCC)   Diabetes mellitus, new onset ([place])   Seizures (HCC)   Paroxysmal atrial fibrillation (HCC)   Slow transit constipation   Lipoma   Sleep disturbance    Expected Discharge Date: Expected Discharge Date: 10/10/20  Team Members Present: Physician leading conference: Dr. Delice Lesch Care Coodinator Present: Dorien Chihuahua, RN, BSN, CRRN;Becky Dupree, LCSW Nurse Present: Annita Brod, LPN PT Present: Becky Sax, PT OT Present: [name], OT PPS Coordinator present : Gunnar Fusi, Novella Olive, PT     Current Status/Progress Goal Weekly Team Focus  Bowel/Bladder   Pt is cont/incont of b/b. LBM 09/30/20  Pt will be continent of b/b  Q2h toileting   Swallow/Nutrition/ Hydration             ADL's   CGA to close supervision with bathing and dressing.  Pt does require AE to allow independence with washing LLE and donning Lt shoe. CGA to supervision ambulatory transfers with RW  Mod I overall, Supervision tub/shower transfers  ADL retraining, transfers, dynamic standing balance, activity tolerance/endurance   Mobility   bed mobility supervision, transfers CGA/min A, gait 127ft with RW and CGA, 8 steps with 2 rails CGA, WC mobility 162ft supervision  Mod I transfers supervision gait and stairs  functional mobility/transfers, generalized strengthening, dynamic standing balance/coordination, ambulaiton, stair navigation, improved endurance.   Communication              Safety/Cognition/ Behavioral Observations            Pain   Pt c/o 8/10 back pain. PRN Oxy effective  Pt will be free of pain  assess pain qshift/prn   Skin   Pt has rash on back, PRN hydrocortisone cream effective.           Discharge Planning:  HOme with son who works from home and can be there with him. Pt wants to be independent and not need an assistive device upon discharge.   Team Discussion: Monitoring weights, following A-Fib/RVR; last NSR. New diagnosis of DM; oral agent initiated.Continent of B+B. C/o itching on back. Patient on target to meet rehab goals: yes, currently CGA for B+D, ambulated 100'without a walker with CGA assist and 8 steps. Mod I goals set except supervision for gait; stairs with bil rails and shower  *See Care Plan and progress notes for long and short-term goals.   Revisions to Treatment Plan:   Teaching Needs: Transfers, steps, toileting, medications, etc.  Current Barriers to Discharge: Decreased caregiver support, Home enviroment access/layout, New diabetic and 14 steps with bil rails at home  Possible Resolutions to Barriers: Family education Diabetes education OP follow up     Medical Summary Current Status: Debility secondary torenal failure and  atrial fibrillation with RVRAcute encephalopathy/sepsis/multimedical.  Barriers to Discharge: Weight;Medical stability;New diabetic;Other (comments)  Barriers to Discharge Comments: 16 steps Possible Resolutions to Barriers/Weekly Focus: Therapies, optimize DM, sleep meds, monitor HR/rhythm, follow weights   Continued Need for Acute Rehabilitation Level  of Care: The patient requires daily medical management by a physician with specialized training in physical medicine and rehabilitation for the following reasons: Direction of a multidisciplinary physical rehabilitation program to maximize functional independence : Yes Medical management of patient stability for increased activity during  participation in an intensive rehabilitation regime.: Yes Analysis of laboratory values and/or radiology reports with any subsequent need for medication adjustment and/or medical intervention. : Yes   I attest that I was present, lead the team conference, and concur with the assessment and plan of the team.   Dorien Chihuahua B 10/03/2020, 1:21 PM

## 2020-10-03 NOTE — Progress Notes (Addendum)
[facility] PHYSICAL MEDICINE & REHABILITATION PROGRESS NOTE   Subjective/Complaints: Patient seen sitting up in bed this morning.  He states he slept fairly overnight.  He requests increasing his sleep aid.  He also notes constipation, but does not want to change medications due to fear of diarrhea.  Review of systems: Denies CP, SOB, N/V/D  Objective:   No results found. Recent Labs    10/01/20 0626  WBC 7.2  HGB 10.1*  HCT 31.3*  PLT 306   Recent Labs    10/01/20 0626  NA 138  K 3.8  CL 101  CO2 30  GLUCOSE 123*  BUN 6*  CREATININE 0.98  CALCIUM 8.0*    Intake/Output Summary (Last 24 hours) at 10/03/2020 1000 Last data filed at 10/03/2020 0730 Gross per 24 hour  Intake 660 ml  Output 1700 ml  Net -1040 ml        Physical Exam: Vital Signs Blood pressure 127/81, pulse 65, temperature 98.1 F (36.7 C), temperature source Oral, resp. rate 18, height 6\' 6"  (1.981 m), weight 132.3 kg, SpO2 99 %. Constitutional: No distress . Vital signs reviewed. HENT: Normocephalic.  Atraumatic. Eyes: EOMI. No discharge.  Left eye?  Lipoma Cardiovascular: No JVD.  RRR. Respiratory: Normal effort.  No stridor.  Bilateral clear to auscultation. GI: Non-distended.  BS +. Skin: Warm and dry.  Intact. Psych: Normal mood.  Normal behavior. Musc: No edema in extremities.  No tenderness in extremities. Neuro: Alert Motor: Bilateral upper extremities: 5/5 proximal distal Bilateral lower extremities: In flexion, knee extension 4+/5, ankle dorsiflexion 5/5  Assessment/Plan: 1. Functional deficits secondary to debility  which require 3+ hours per day of interdisciplinary therapy in a comprehensive inpatient rehab setting.  Physiatrist is providing close team supervision and 24 hour management of active medical problems listed below.  Physiatrist and rehab team continue to assess barriers to discharge/monitor patient progress toward functional and medical goals  Care  Tool:  Bathing    Body parts bathed by patient: Right arm, Left arm, Chest, Abdomen, Front perineal area, Right upper leg, Left upper leg, Face, Buttocks, Right lower leg   Body parts bathed by helper: Left lower leg     Bathing assist Assist Level: Minimal Assistance - Patient > 75%     Upper Body Dressing/Undressing Upper body dressing   What is the patient wearing?: Pull over shirt    Upper body assist Assist Level: Set up assist    Lower Body Dressing/Undressing Lower body dressing      What is the patient wearing?: Underwear/pull up, Pants     Lower body assist Assist for lower body dressing: Contact Guard/Touching assist     Toileting Toileting    Toileting assist Assist for toileting: Contact Guard/Touching assist     Transfers Chair/bed transfer  Transfers assist     Chair/bed transfer assist level: Contact Guard/Touching assist     Locomotion Ambulation   Ambulation assist      Assist level: Contact Guard/Touching assist Assistive device: Walker-rolling Max distance: 170ft   Walk 10 feet activity   Assist     Assist level: Contact Guard/Touching assist Assistive device: Walker-rolling   Walk 50 feet activity   Assist Walk 50 feet with 2 turns activity did not occur: Safety/medical concerns (fatigue, LE weakness, decreased balance/postural control)  Assist level: Contact Guard/Touching assist Assistive device: Walker-rolling    Walk 150 feet activity   Assist Walk 150 feet activity did not occur: Safety/medical concerns (fatigue, LE weakness, decreased  balance/postural control)  Assist level: Contact Guard/Touching assist Assistive device: Walker-rolling    Walk 10 feet on uneven surface  activity   Assist Walk 10 feet on uneven surfaces activity did not occur: Safety/medical concerns (fatigue, LE weakness, decreased balance/postural control)         Wheelchair     Assist Will patient use wheelchair at  discharge?: No Type of Wheelchair: Manual    Wheelchair assist level: Supervision/Verbal cueing Max wheelchair distance: 49ft    Wheelchair 50 feet with 2 turns activity    Assist        Assist Level: Supervision/Verbal cueing   Wheelchair 150 feet activity     Assist      Assist Level: Maximal Assistance - Patient 25 - 49%    Medical Problem List and Plan: 1.Debilitysecondary torenal failure and  atrial fibrillation with RVR Acute encephalopathy/sepsis/multimedical.  Continue CIR   Team conference today to discuss current and goals and coordination of care, home and environmental barriers, and discharge planning with nursing, case manager, and therapies. Please see conference note from today as well.  2. Antithrombotics: -DVT/anticoagulation:Eliquis -antiplatelet therapy: Plavix 75 mg daily 3. Pain Management:Tramadol as needed 4. Mood:Provide emotional support -antipsychotic agents: N/A 5. Neuropsych: This patientiscapable of making decisions on hisown behalf. 6. Skin/Wound Care:Routine skin checks 7. Fluids/Electrolytes/Nutrition:Routine in and outs 8. Diastolic congestive heart failure. ContinueLasix 40 mg twice daily, Coreg CR80 mg daily, Entresto 97-103 mg twice daily. Follow-up cardiology service. Monitor for any signs of fluid overload  Daily weights ordered Filed Weights   09/29/20 1524 09/30/20 0500 10/03/20 0500  Weight: 135.7 kg 133.3 kg 132.3 kg   Improving on 10/20 9. A. fib with RVR. Status post DCCV 09/19/2020. Continue amiodarone as well as carvedilol controlled release to 80 mg/day Vitals:   10/02/20 2009 10/03/20 0454  BP: 121/79 127/81  Pulse: 67 65  Resp: 18 18  Temp: 97.9 F (36.6 C) 98.1 F (36.7 C)  SpO2: 98% 99%   Rate controlled on 10/20 10. History of seizure disorder. Keppra 500 mg twice daily  No breakthrough seizures from admission to rehab-10/20 11. New onset diabetes  mellitus. Hemoglobin A1c 6.9 on 10/8.   Levemir 5 units twice daily DC'd on 10/19.   Diabetic teaching CBG (last 3)  Recent Labs    10/02/20 1653 10/02/20 2102 10/03/20 0545  GLUCAP 92 128* 110*   Will consider medication initiation tomorrow persistently elevated 12. Hyperlipidemia.  Lipitor 13. Slow transit constipation. MiraLAX twice daily, Colace twice daily  Does not want to increase medications at present, will continue to monitor 14.  Mild hypoalbuminemia  Supplement initiated on 10/18 15.  Acute blood loss anemia  Hemoglobin 10.1 on 10/18  Continue to monitor  16.  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  Sleep disturbance  Melatonin ordered on 10/20   LOS: 4 days A FACE TO FACE EVALUATION WAS PERFORMED  Allani Reber Lorie Phenix 10/03/2020, 10:00 AM

## 2020-10-03 NOTE — Progress Notes (Signed)
Physical Therapy Session Note  Patient Details  Name: Gregory [name] MRN: [id number] Date of Birth: [birth year]  Today's Date: 10/03/2020 PT Individual Time: 0900-1010 PT Individual Time Calculation (min): 70 min   Short Term Goals: Week 1:  PT Short Term Goal 1 (Week 1): pt will transfer stand<>pivot with LRAD and CGA PT Short Term Goal 2 (Week 1): Pt will ambulate 35ft with LRAD CGA PT Short Term Goal 3 (Week 1): Pt will navigate 8 steps with CGA  Skilled Therapeutic Interventions/Progress Updates:   Received pt supine in bed with RN present administering medications, pt agreeable to therapy, and reported pain 2/10 in low back but declined any pain interventions. Session with emphasis on functional mobility/transfers, dressing, toileting, generalized strengthening, dynamic standing balance/coordination, ambulation, stair navigation, and improved activity tolerance. Donned bilateral ted hose with total A and pt performed bed mobility with supervision and donned pants and clean shirt sitting EOB with supervision and shoes with max A. Stand<>pivot bed<>WC without AD and CGA. Pt sat in [facility] and brushed teeth and washed face at sink with supervision. Pt reported urge to use restroom and ambulated 41ft x 2 trials to/from bathroom without AD and CGA to bathroom and able to manage clothing with close supervision, however pt ultimately unsuccessful. Pt transported to therapy gym in [facility] total A for time management purposes and navigated 8 steps with 2 rails ascending and descending with a step through pattern with CGA. HR 91bpm after activity and decreasing to 80bpm with seated rest break. Per pt report, on Saturday he is getting a second handrail installed on stairs. Pt ambulated 156ft without AD and CGA with no LOB noted. However, pt reported L LE feeling weaker than R LE. Pt required multiple extended rest breaks throughout session due to increased fatigue. In // bars, worked on forward/backwards  tandem walking x 2 laps with close supervision and bilateral UE support and cues for upright posture. Pt performed lateral stepping without UE support x 2 laps with close supervision and cues to "point toes forward". Pt reported 6/10 fatigue after activity. Pt requested to work on upper body exercises and performed the following exercises sitting in [facility] with supervision and verbal cues for technique: -WC push ups 1x10 and 1x8 -overhead shoulder press with 6lb medicine ball 2x8  -seated trunk rotation with 6lb medicine ball x8 bilaterally -single arm bicep curls with 10lb dumbbell 2x10 bilaterally  Pt transported back to room in [facility] total A. Concluded session with pt sitting in WC, needs within reach, and chair pad alarm on.   Therapy Documentation Precautions:  Precautions Precautions: Fall Restrictions Weight Bearing Restrictions: No  Therapy/Group: Individual Therapy Alfonse Alpers PT, DPT   10/03/2020, 7:22 AM

## 2020-10-03 NOTE — Progress Notes (Signed)
Physical Therapy Session Note  Patient Details  Name: Gregory [name] MRN: [id number] Date of Birth: [birth year]  Today's Date: 10/03/2020 PT Individual Time: 1105-1200 PT Individual Time Calculation (min): 55 min   Short Term Goals: Week 1:  PT Short Term Goal 1 (Week 1): pt will transfer stand<>pivot with LRAD and CGA PT Short Term Goal 2 (Week 1): Pt will ambulate 11f with LRAD CGA PT Short Term Goal 3 (Week 1): Pt will navigate 8 steps with CGA  Skilled Therapeutic Interventions/Progress Updates: Pt presented in w/c agreeable to therapy. Pt denies pain but states significant fatigue. Pt indicating that he wishes to attempt bathroom. Pt performed ambulatory transfer to bathroom no AD, CGA and performed toilet transfers with close S (+BM/ urinary void). Pt returned to w/c and pt transported to day room total A for energy conservation. Pt transferred to NuStep and participated in NuStep L5 x 3 min then 5 min with extended recovery break between. Pt indicated consistently 13 on Borg scale and vitals taken intermittently with SpO2 >95% and max HR 79. Pt returned to w/c in same manner as prior and transported back to room. Performed ambulatory transfer to bed and pt doffed shoes and pants with supervision. Pt returned to supine supervision with bed flat and pt able to reposition to comfort. Pt left with bed alarm on, call bell within reach and current needs met.      Therapy Documentation Precautions:  Precautions Precautions: Fall Restrictions Weight Bearing Restrictions: No General:   Vital Signs:    Therapy/Group: Individual Therapy  Anikah Hogge  Azara Gemme, PTA  10/03/2020, 12:32 PM

## 2020-10-03 NOTE — Progress Notes (Signed)
Patient places himself on CPAP. Patient wearing CPAP when RT entered room. No respiratory distress noted. RT will monitor as needed.

## 2020-10-03 NOTE — Progress Notes (Signed)
Occupational Therapy Session Note  Patient Details  Name: Gregory [name] MRN: [id number] Date of Birth: 1961-07-13  Today's Date: 10/03/2020 OT Individual Time: 7124-5809 OT Individual Time Calculation (min): 54 min    Short Term Goals: Week 1:  OT Short Term Goal 1 (Week 1): LTG=STG  mod I overall  Skilled Therapeutic Interventions/Progress Updates:    Treatment session with focus on functional mobility, transfers, and dynamic standing balance during self-care tasks.  Pt received supine in bed reporting that his thighs were "fried".  Pt agreeable to shower and expressing desire to ambulate without AD to bathroom.  Pt completed bed mobility supervision and ambulated to sink without AD with CGA.  Pt sat at sink to complete grooming tasks.  Pt then ambulated to toilet without AD with CGA.  Pt had BM on toilet with increased time.  Pt stood from toilet CGA and then completed hygiene with supervision.  Pt ambulated to shower and completed bathing at supervision level at sit > stand to wash buttocks.  Pt reports fatigue post shower and requested to ambulate back to bed with RW.  Pt completed ambulatory transfer back to EOB with RW with supervision.  Pt completed dressing tasks at sit > stand level with supervision to CGA and rest breaks between activities.  Pt returned to supine due to fatigue and left semi-reclined in bed with all needs in reach.  Therapy Documentation Precautions:  Precautions Precautions: Fall Restrictions Weight Bearing Restrictions: No General:   Vital Signs: Therapy Vitals Temp: 98.8 F (37.1 C) Temp Source: Oral Pulse Rate: 67 Resp: 18 BP: 103/68 Patient Position (if appropriate): Lying Oxygen Therapy SpO2: 97 % O2 Device: Room Air Pain: Pain Assessment Pain Scale: 0-10 Pain Score: 3  Pain Location: Back Pain Orientation: Lower Pain Intervention(s): Medication (See eMAR)   Therapy/Group: Individual Therapy  [name] 10/03/2020, 4:28  PM

## 2020-10-03 NOTE — Progress Notes (Signed)
Patient ID: Gregory [name], male   DOB: 07/23/1961, 59 y.o.   MRN: [id number]  Met with pt to discuss team conference goals supervision level and target discharge date of 10/27. He lives with his son who works from home so supervision is not a problem. His daughter will be purchasing any equipment he will need. Discussed needing OPPT. He reported Workers Comp will need to pre-approve this prior to be setting up. Contacted Workers Comp adjuster-[name] to discuss discharge and recommendations. She wanted clinicals faxed which they were and she would fax this worker a form for prior approval for OPPT and medication for PA to complete. Will continue to work on discharge needs.  

## 2020-10-04 ENCOUNTER — Inpatient Hospital Stay (HOSPITAL_COMMUNITY): Payer: Self-pay

## 2020-10-04 ENCOUNTER — Inpatient Hospital Stay (HOSPITAL_COMMUNITY): Payer: Self-pay | Admitting: Physical Therapy

## 2020-10-04 ENCOUNTER — Inpatient Hospital Stay (HOSPITAL_COMMUNITY): Payer: Self-pay | Admitting: Occupational Therapy

## 2020-10-04 LAB — GLUCOSE, CAPILLARY
Glucose-Capillary: 132 mg/dL — ABNORMAL HIGH (ref 70–99)
Glucose-Capillary: 91 mg/dL (ref 70–99)
Glucose-Capillary: 110 mg/dL — ABNORMAL HIGH (ref 70–99)
Glucose-Capillary: 158 mg/dL — ABNORMAL HIGH (ref 70–99)

## 2020-10-04 NOTE — Progress Notes (Signed)
Occupational Therapy Session Note  Patient Details  Name: Gregory [name] MRN: [id number] Date of Birth: [birth year]  Today's Date: 10/04/2020 OT Individual Time: 5732-2025 OT Individual Time Calculation (min): 62 min    Short Term Goals: Week 1:  OT Short Term Goal 1 (Week 1): LTG=STG  mod I overall  Skilled Therapeutic Interventions/Progress Updates:    Patient in bed, alert and ready for therapy session.  He denies pain and requests a shower to start this am.  Bed mobility with CS.  Sit to stand and ambulation without AD to/from bed, toilet, shower bench with CGA.   Used RW to ambulate back to bed after shower due to fatigue with CGA.   Long handled sponge provided for bathing.  He completes toileting with CS, shower set up/CS, dressing set up for donning pants, shirt and shoes using shoe funnel.  CGA in stance for CM.  Dependent for teds,  Grooming tasks seated at sink with set up.  Provided high profile cushion for improved positioning in w/c.  Reviewed and completed LB stretching activities and posture exercises with good carryover.  He remained seated in w/c at close of session, seat alarm set and call bell in hand.   Therapy Documentation Precautions:  Precautions Precautions: Fall Restrictions Weight Bearing Restrictions: No   Therapy/Group: Individual Therapy  Gregory [name] 10/04/2020, 7:31 AM

## 2020-10-04 NOTE — Progress Notes (Signed)
Physical Therapy Session Note  Patient Details  Name: Gregory [name] MRN: [id number] Date of Birth: 1961-06-20  Today's Date: 10/04/2020 PT Individual Time: 1027-1139 PT Individual Time Calculation (min): 72 min   Short Term Goals: Week 1:  PT Short Term Goal 1 (Week 1): pt will transfer stand<>pivot with LRAD and CGA PT Short Term Goal 2 (Week 1): Pt will ambulate 8ft with LRAD CGA PT Short Term Goal 3 (Week 1): Pt will navigate 8 steps with CGA  Skilled Therapeutic Interventions/Progress Updates:    Pt received sitting in w/c and agreeable to therapy session. Reports feeling "a little more unsteady this morning" compared to yesterday. Transported to/from gym in w/c for time management and energy conservation. Sit<>stands and stand pivot transfers, no AD, with CGA for steadying throughout session - uses UE support for assist with controlled descent as becomes fatigued. Gait training ~117ft x2, no AD, with CGA for steadying - demos 1x minor LOB when turning L but pt able to recover without increased assist - able to navigate around obstacles with slight side step technique with only CGA. Ascended/descended 12 steps using B HRs with reciprocal pattern each direction and CGA for steadying - pt experiencing some quad weakness towards end but no knee instability noted. Lateral side stepping ~10steps down/back x2 with level 1 theraband resistance to increase hip abductor activation. Standing tolerance and balance task via playing horseshoes with CGA for steadying. Squatting down to grab object from floor 3x, no UE support, with CGA for steadying.  Pt reporting B LE fatigue therefore transitioned to unsupported sitting UE exercises with core involvement: - rows 2x15 reps against level 4 theraband resistance - D1 extension x15 reps each UE against level 4 theraband resistance  Pt continues to require seated rest breaks after each task due to impaired cardiopulmonary endurance. At end of  session pt reported significant fatigue. SpO2 monitored during session ~95% and HR elevating up to 105bpm with activity and recovering to ~80bpm with rest. Transported back to room and pt requesting to return to bed. Stand pivot, no AD, with CGA. Sit>supine with supervision. Pt left supine in bed with needs in reach and bed alarm on.  Therapy Documentation Precautions:  Precautions Precautions: Fall Restrictions Weight Bearing Restrictions: No  Pain:  Denies pain during session.    Therapy/Group: Individual Therapy  Tawana Scale , PT, DPT, CSRS  10/04/2020, 8:00 AM

## 2020-10-04 NOTE — Progress Notes (Signed)
[place] PHYSICAL MEDICINE & REHABILITATION PROGRESS NOTE   Subjective/Complaints: Patient seen laying in bed this AM.  He states he slept well overnight.  He is questions regarding discharge medications as well as diabetes.  Review of systems: Denies CP, SOB, N/V/D  Objective:   No results found. No results for input(s): WBC, HGB, HCT, PLT in the last 72 hours. No results for input(s): NA, K, CL, CO2, GLUCOSE, BUN, CREATININE, CALCIUM in the last 72 hours.  Intake/Output Summary (Last 24 hours) at 10/04/2020 1530 Last data filed at 10/04/2020 1230 Gross per 24 hour  Intake 480 ml  Output 2200 ml  Net -1720 ml        Physical Exam: Vital Signs Blood pressure 105/73, pulse 71, temperature 98.2 F (36.8 C), temperature source Oral, resp. rate 18, height 6\' 6"  (1.981 m), weight 132.3 kg, SpO2 96 %. Constitutional: No distress . Vital signs reviewed. HENT: Normocephalic.  Atraumatic. Eyes: EOMI. No discharge.  Left eye lipoma Cardiovascular: No JVD.  RRR. Respiratory: Normal effort.  No stridor.  Bilateral clear to auscultation. GI: Non-distended.  BS +. Skin: Warm and dry.  Intact. Psych: Normal mood.  Normal behavior. Musc: No edema in extremities.  No tenderness in extremities. Neuro: Alert Motor: Bilateral upper extremities: 5/5 proximal distal Bilateral lower extremities: In flexion, knee extension 4+/5, ankle dorsiflexion 5/5, unchanged  Assessment/Plan: 1. Functional deficits secondary to debility  which require 3+ hours per day of interdisciplinary therapy in a comprehensive inpatient rehab setting.  Physiatrist is providing close team supervision and 24 hour management of active medical problems listed below.  Physiatrist and rehab team continue to assess barriers to discharge/monitor patient progress toward functional and medical goals  Care Tool:  Bathing    Body parts bathed by patient: Right arm, Left arm, Chest, Abdomen, Front perineal area, Right  upper leg, Left upper leg, Face, Buttocks, Right lower leg, Left lower leg   Body parts bathed by helper: Left lower leg     Bathing assist Assist Level: Supervision/Verbal cueing     Upper Body Dressing/Undressing Upper body dressing   What is the patient wearing?: Pull over shirt    Upper body assist Assist Level: Set up assist    Lower Body Dressing/Undressing Lower body dressing      What is the patient wearing?: Underwear/pull up, Pants     Lower body assist Assist for lower body dressing: Supervision/Verbal cueing     Toileting Toileting    Toileting assist Assist for toileting: Supervision/Verbal cueing     Transfers Chair/bed transfer  Transfers assist     Chair/bed transfer assist level: Contact Guard/Touching assist     Locomotion Ambulation   Ambulation assist      Assist level: Contact Guard/Touching assist Assistive device: No Device Max distance: 192ft   Walk 10 feet activity   Assist     Assist level: Contact Guard/Touching assist Assistive device: No Device   Walk 50 feet activity   Assist Walk 50 feet with 2 turns activity did not occur: Safety/medical concerns (fatigue, LE weakness, decreased balance/postural control)  Assist level: Contact Guard/Touching assist Assistive device: No Device    Walk 150 feet activity   Assist Walk 150 feet activity did not occur: Safety/medical concerns (fatigue, LE weakness, decreased balance/postural control)  Assist level: Contact Guard/Touching assist Assistive device: Walker-rolling    Walk 10 feet on uneven surface  activity   Assist Walk 10 feet on uneven surfaces activity did not occur: Safety/medical concerns (fatigue,  LE weakness, decreased balance/postural control)         Wheelchair     Assist Will patient use wheelchair at discharge?: No Type of Wheelchair: Manual    Wheelchair assist level: Supervision/Verbal cueing Max wheelchair distance: 80ft     Wheelchair 50 feet with 2 turns activity    Assist        Assist Level: Supervision/Verbal cueing   Wheelchair 150 feet activity     Assist      Assist Level: Maximal Assistance - Patient 25 - 49%    Medical Problem List and Plan: 1.Debilitysecondary torenal failure and  atrial fibrillation with RVR Acute encephalopathy/sepsis/multimedical.  Continue CIR  2. Antithrombotics: -DVT/anticoagulation:Eliquis -antiplatelet therapy: Plavix 75 mg daily 3. Pain Management:Tramadol as needed 4. Mood:Provide emotional support -antipsychotic agents: N/A 5. Neuropsych: This patientiscapable of making decisions on hisown behalf. 6. Skin/Wound Care:Routine skin checks 7. Fluids/Electrolytes/Nutrition:Routine in and outs 8. Diastolic congestive heart failure. ContinueLasix 40 mg twice daily, Coreg CR80 mg daily, Entresto 97-103 mg twice daily. Follow-up cardiology service. Monitor for any signs of fluid overload  Daily weights ordered Filed Weights   09/29/20 1524 09/30/20 0500 10/03/20 0500  Weight: 135.7 kg 133.3 kg 132.3 kg   Improving on 10/20, no weights for today 9. A. fib with RVR. Status post DCCV 09/19/2020. Continue amiodarone as well as carvedilol controlled release to 80 mg/day Vitals:   10/04/20 0700 10/04/20 1402  BP: 124/81 105/73  Pulse: 73 71  Resp:  18  Temp:  98.2 F (36.8 C)  SpO2:  96%   Rate controlled on 10/21 10. History of seizure disorder. Keppra 500 mg twice daily  No breakthrough seizures from admission to rehab-10/21 11. New onset diabetes mellitus. Hemoglobin A1c 6.9 on 10/8.   Levemir 5 units twice daily DC'd on 10/19.   Diabetic teaching CBG (last 3)  Recent Labs    10/03/20 2057 10/04/20 0607 10/04/20 1148  GLUCAP 134* 132* 91   Slightly labile on 10/21, will not initiate medications today 12. Hyperlipidemia.  Lipitor 13. Slow transit constipation. MiraLAX twice daily, Colace  twice daily  Does not want to increase medications at present, will continue to monitor 14.  Mild hypoalbuminemia  Supplement initiated on 10/18 15.  Acute blood loss anemia  Hemoglobin 10.1 on 10/18  Continue to monitor  16.  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  Sleep disturbance  Melatonin ordered on 10/20  Improving   LOS: 5 days A FACE TO FACE EVALUATION WAS PERFORMED  Riannah Stagner Lorie Phenix 10/04/2020, 3:30 PM

## 2020-10-04 NOTE — Progress Notes (Signed)
Physical Therapy Session Note  Patient Details  Name: Gregory [name] MRN: [id number] Date of Birth: 1961-03-26  Today's Date: 10/04/2020 PT Individual Time: 1415-1530 PT Individual Time Calculation (min): 75 min   Short Term Goals: Week 1:  PT Short Term Goal 1 (Week 1): pt will transfer stand<>pivot with LRAD and CGA PT Short Term Goal 2 (Week 1): Pt will ambulate 6ft with LRAD CGA PT Short Term Goal 3 (Week 1): Pt will navigate 8 steps with CGA  Skilled Therapeutic Interventions/Progress Updates:    Patient received supine in bed, agreeable to PT. He denies pain. PT donning TED hose with patient supine in bed. He was able to come to sitting edge of bed independently donning pants with supervision and MinA for shoes. Patient ambulating to bathroom with supervision. He was able to have a bowel movement and completed peri hygiene with supervision. Patient requesting to go outside. PT propelling patient in wc outside for time management and energy conservation. He was able to ambulate >133ft outdoors over uneven ground with supervision. Verbal cuing for breath management and energy conservation techniques when ambulating further distances. Patient completing 2x6 mins on NuStep for improved LE coordination and reciprocal stepping pattern. Rest break needed due to LE fatigue/SOB. Patient remains with moderately impaired endurance impacting his functional mobility. Patient transferring to bed via stand pivot with supervision, bed alarm on, call light within reach.   Therapy Documentation Precautions:  Precautions Precautions: Fall Restrictions Weight Bearing Restrictions: No   Therapy/Group: Individual Therapy  Karoline Caldwell, PT, DPT, CBIS 10/04/2020, 7:45 AM

## 2020-10-05 ENCOUNTER — Inpatient Hospital Stay (HOSPITAL_COMMUNITY): Payer: Self-pay | Admitting: Occupational Therapy

## 2020-10-05 ENCOUNTER — Inpatient Hospital Stay (HOSPITAL_COMMUNITY): Payer: Self-pay

## 2020-10-05 ENCOUNTER — Encounter (HOSPITAL_COMMUNITY): Payer: Self-pay

## 2020-10-05 DIAGNOSIS — R7309 Other abnormal glucose: Secondary | ICD-10-CM

## 2020-10-05 LAB — GLUCOSE, CAPILLARY
Glucose-Capillary: 126 mg/dL — ABNORMAL HIGH (ref 70–99)
Glucose-Capillary: 101 mg/dL — ABNORMAL HIGH (ref 70–99)
Glucose-Capillary: 119 mg/dL — ABNORMAL HIGH (ref 70–99)
Glucose-Capillary: 146 mg/dL — ABNORMAL HIGH (ref 70–99)

## 2020-10-05 MED ORDER — VITAMINS A & D EX OINT
TOPICAL_OINTMENT | CUTANEOUS | Status: DC | PRN
  Filled 2020-10-05: qty 56.7

## 2020-10-05 MED ORDER — MELATONIN 3 MG PO TABS
3.0000 mg | ORAL_TABLET | Freq: Every day | ORAL | Status: DC
  Administered 2020-10-05: 3 mg via ORAL
  Filled 2020-10-05: qty 1

## 2020-10-05 NOTE — Progress Notes (Signed)
Patient ID: Gregory [name], male   DOB: [birth year], 59 y.o.   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 Will make OPPT referral and prepare for his discharge 10/27

## 2020-10-05 NOTE — Progress Notes (Signed)
[place] PHYSICAL MEDICINE & REHABILITATION PROGRESS NOTE   Subjective/Complaints: Patient seen sitting up in a chair, working with therapy this morning.  He states he slept fairly overnight.  He requests adjustment is sleepy.  He also notes itching in his back and requests ointment.  Review of systems: + Pruritus on back.  Denies CP, SOB, N/V/D  Objective:   No results found. No results for input(s): WBC, HGB, HCT, PLT in the last 72 hours. No results for input(s): NA, K, CL, CO2, GLUCOSE, BUN, CREATININE, CALCIUM in the last 72 hours.  Intake/Output Summary (Last 24 hours) at 10/05/2020 1351 Last data filed at 10/05/2020 1000 Gross per 24 hour  Intake 240 ml  Output 1700 ml  Net -1460 ml        Physical Exam: Vital Signs Blood pressure 104/77, pulse 71, temperature 98 F (36.7 C), temperature source Oral, resp. rate 18, height 6\' 6"  (1.981 m), weight 132.3 kg, SpO2 95 %. Constitutional: No distress . Vital signs reviewed. HENT: Normocephalic.  Atraumatic. Eyes: EOMI. No discharge.  Left eye lipoma. Cardiovascular: No JVD.  RRR. Respiratory: Normal effort.  No stridor.  Bilateral clear to auscultation. GI: Non-distended.  BS +. Skin: Warm and dry.  Intact. Psych: Normal mood.  Normal behavior. Musc: No edema in extremities.  No tenderness in extremities. Neuro: Alert Motor: Bilateral upper extremities: 5/5 proximal distal Bilateral lower extremities: In flexion, knee extension 4+/5, ankle dorsiflexion 5/5, stable  Assessment/Plan: 1. Functional deficits secondary to debility  which require 3+ hours per day of interdisciplinary therapy in a comprehensive inpatient rehab setting.  Physiatrist is providing close team supervision and 24 hour management of active medical problems listed below.  Physiatrist and rehab team continue to assess barriers to discharge/monitor patient progress toward functional and medical goals  Care Tool:  Bathing    Body parts bathed by  patient: Right arm, Left arm, Chest, Abdomen, Front perineal area, Right upper leg, Left upper leg, Face, Buttocks, Right lower leg, Left lower leg   Body parts bathed by helper: Left lower leg     Bathing assist Assist Level: Supervision/Verbal cueing     Upper Body Dressing/Undressing Upper body dressing   What is the patient wearing?: Pull over shirt    Upper body assist Assist Level: Independent    Lower Body Dressing/Undressing Lower body dressing      What is the patient wearing?: Underwear/pull up, Pants     Lower body assist Assist for lower body dressing: Independent with assitive device     Toileting Toileting    Toileting assist Assist for toileting: Supervision/Verbal cueing     Transfers Chair/bed transfer  Transfers assist     Chair/bed transfer assist level: Supervision/Verbal cueing     Locomotion Ambulation   Ambulation assist      Assist level: Supervision/Verbal cueing Assistive device: No Device Max distance: 120'   Walk 10 feet activity   Assist     Assist level: Supervision/Verbal cueing Assistive device: No Device   Walk 50 feet activity   Assist Walk 50 feet with 2 turns activity did not occur: Safety/medical concerns (fatigue, LE weakness, decreased balance/postural control)  Assist level: Supervision/Verbal cueing Assistive device: No Device    Walk 150 feet activity   Assist Walk 150 feet activity did not occur: Safety/medical concerns (fatigue, LE weakness, decreased balance/postural control)  Assist level: Supervision/Verbal cueing Assistive device: No Device    Walk 10 feet on uneven surface  activity   Assist Walk  10 feet on uneven surfaces activity did not occur: Safety/medical concerns (fatigue, LE weakness, decreased balance/postural control)   Assist level: Supervision/Verbal cueing     Wheelchair     Assist Will patient use wheelchair at discharge?: No Type of Wheelchair: Manual     Wheelchair assist level: Supervision/Verbal cueing Max wheelchair distance: 34ft    Wheelchair 50 feet with 2 turns activity    Assist        Assist Level: Supervision/Verbal cueing   Wheelchair 150 feet activity     Assist      Assist Level: Maximal Assistance - Patient 25 - 49%    Medical Problem List and Plan: 1.Debilitysecondary torenal failure and  atrial fibrillation with RVR Acute encephalopathy/sepsis/multimedical.  Continue CIR  2. Antithrombotics: -DVT/anticoagulation:Eliquis -antiplatelet therapy: Plavix 75 mg daily 3. Pain Management:Tramadol as needed 4. Mood:Provide emotional support -antipsychotic agents: N/A 5. Neuropsych: This patientiscapable of making decisions on hisown behalf. 6. Skin/Wound Care:Routine skin checks  A&D ointment ordered for pruritus/dry skin 7. Fluids/Electrolytes/Nutrition:Routine in and outs 8. Diastolic congestive heart failure. ContinueLasix 40 mg twice daily, Coreg CR80 mg daily, Entresto 97-103 mg twice daily. Follow-up cardiology service. Monitor for any signs of fluid overload  Daily weights ordered Filed Weights   09/29/20 1524 09/30/20 0500 10/03/20 0500  Weight: 135.7 kg 133.3 kg 132.3 kg   Improving on 10/20, no weights since 10/20 9. A. fib with RVR. Status post DCCV 09/19/2020. Continue amiodarone as well as carvedilol controlled release to 80 mg/day Vitals:   10/05/20 0453 10/05/20 0800  BP: 125/83 104/77  Pulse: 64 71  Resp: 18   Temp: 98 F (36.7 C)   SpO2: 95%    Rate controlled on 10/22 10. History of seizure disorder. Keppra 500 mg twice daily  No breakthrough seizures from admission to rehab-10/22 11. New onset diabetes mellitus. Hemoglobin A1c 6.9 on 10/8.   Levemir 5 units twice daily DC'd on 10/19.   Diabetic teaching CBG (last 3)  Recent Labs    10/04/20 2101 10/05/20 0604 10/05/20 1207  GLUCAP 158* 126* 101*   Slightly labile on  10/22 12. Hyperlipidemia.  Lipitor 13. Slow transit constipation. MiraLAX twice daily, Colace twice daily  Does not want to increase medications at present, will continue to monitor 14.  Mild hypoalbuminemia  Supplement initiated on 10/18 15.  Acute blood loss anemia  Hemoglobin 10.1 on 10/18  Continue to monitor  16.  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  Sleep disturbance  Melatonin ordered on 10/20, increased on 10/22   LOS: 6 days A FACE TO FACE EVALUATION WAS PERFORMED  Gregory [name] Gregory [name] 10/05/2020, 1:51 PM

## 2020-10-05 NOTE — Progress Notes (Signed)
Physical Therapy Session Note  Patient Details  Name: Falon Flinchum MRN: [id number] Date of Birth: 1961-11-01  Today's Date: 10/05/2020 PT Group Time: 1055-1150 PT Group Time Calculation (min): 55 min  Short Term Goals: Week 1:  PT Short Term Goal 1 (Week 1): pt will transfer stand<>pivot with LRAD and CGA PT Short Term Goal 2 (Week 1): Pt will ambulate 34ft with LRAD CGA PT Short Term Goal 3 (Week 1): Pt will navigate 8 steps with CGA  Skilled Therapeutic Interventions/Progress Updates:    Pt participated in therapeutic activities group to address functional balance, muscular endurance, sit <> stands,gait and transfers in a social setting. Pt requires overall supervision assistance for transfers, gait, and dynamic standing balance during session. Pt participated in [place] activity to address the above impairments. Pt able to perform gait back to room x 120' with close supervision for safety. Pt     Therapy Documentation Precautions:  Precautions Precautions: Fall Restrictions Weight Bearing Restrictions: No    Pain:  NO complaints of pain.    Therapy/Group: Group Therapy   Lars Masson, PT, DPT, CBIS  10/05/2020, 11:59 AM

## 2020-10-05 NOTE — Progress Notes (Signed)
Occupational Therapy Session Note  Patient Details  Name: Gregory [name] MRN: [id number] Date of Birth: 1961/08/26  Today's Date: 10/05/2020 OT Individual Time: 7829-5621 OT Individual Time Calculation (min): 60 min    Short Term Goals: Week 1:  OT Short Term Goal 1 (Week 1): LTG=STG  mod I overall  Skilled Therapeutic Interventions/Progress Updates:    Treatment session with focus on functional mobility without AD and activity tolerance during self-care retraining tasks.  Pt received upright in bed requesting to shower this session.  Pt expressed desire to "try to do everything on my own" and requested to ambulate without AD as able.  Pt completed bed mobility Mod I and sit > stand from EOB supervision.  Pt ambulated to sink Supervision and completed grooming tasks seated in w/c for energy conservation.  Pt then ambulated to toilet without AD with supervision and completed toilet transfer and toileting with supervision.  Pt ambulated to bench in shower with supervision and completed all bathing with supervision with use of long handled sponge to wash back and Lt foot.  Pt required seated rest breaks post showering, but then able to ambulate back to EOB (~15 feet) without AD to engage in dressing.  Pt donned underwear and pants at sit > stand level with supervision.  Discussed techniques for donning TEDS, with pt able to don Rt stocking but required assistance to don Lt due to increased swelling and decreased ROM on Lt.  Pt donned shoes with use of shoe funnel Mod I, reports daughter ordered him one.  Discussed tub bench vs shower chair as pt reports his daughter plans to order him a seat.  Completed tub/shower transfer with tub bench in ADL apt with pt able to complete transfer without RW with supervision.  Pt agreeable with recommendation for tub bench over shower chair for improved safety and independence.  Pt reports he will notify his daughter of recommendation for tub transfer bench.  Pt returned to room and remained upright in w/c with all needs in reach.  Therapy Documentation Precautions:  Precautions Precautions: Fall Restrictions Weight Bearing Restrictions: No General:   Vital Signs: Therapy Vitals Pulse Rate: 71 BP: 104/77 Pain: Pain Assessment Pain Scale: 0-10 Pain Score: 0-No pain Patients Stated Pain Goal: 3   Therapy/Group: Individual Therapy  Gregory [name] 10/05/2020, 10:40 AM

## 2020-10-05 NOTE — Progress Notes (Signed)
Physical Therapy Session Note  Patient Details  Name: Gregory [name] MRN: [id number] Date of Birth: 03/29/1961  Today's Date: 10/05/2020 PT Individual Time: 1300-1400 PT Individual Time Calculation (min): 60 min   Short Term Goals: Week 1:  PT Short Term Goal 1 (Week 1): pt will transfer stand<>pivot with LRAD and CGA PT Short Term Goal 2 (Week 1): Pt will ambulate 18ft with LRAD CGA PT Short Term Goal 3 (Week 1): Pt will navigate 8 steps with CGA  Skilled Therapeutic Interventions/Progress Updates:    Patient received sitting up in wc, agreeable to PT. He denies pain. Patient able to ambulate into bathroom and complete all pericare independently. He does report increase in R LE fatigue today. PT propelling patient in wc to therapy gym for energy conservation. He was able to negotiate 8 stairs x2 with B HR use and supervision. Seated rest break needed between bouts. Oxygen 95% and HR 87BPM after 1st stair negotiation. Discussed step-to gait pattern vs reciprocal stepping pattern for safety and energy conservation. Patient verbalized understanding. He was able to complete dynamic standing balance task engaging B UE reaching outside his base of support with supervision. He was able to remain standing for ~5 mins at a time prior to needing seated rest break. Nustep 2x6 mins for B LE endurance and cardiovascular endurance. He reports 14/20 RPE after each bout. Patient returning to bed, bed alarm on, call light within reach.   Therapy Documentation Precautions:  Precautions Precautions: Fall Restrictions Weight Bearing Restrictions: No    Therapy/Group: Individual Therapy  Karoline Caldwell, PT, DPT, CBIS 10/05/2020, 7:42 AM

## 2020-10-06 ENCOUNTER — Inpatient Hospital Stay (HOSPITAL_COMMUNITY): Payer: Self-pay | Admitting: Physical Therapy

## 2020-10-06 ENCOUNTER — Inpatient Hospital Stay (HOSPITAL_COMMUNITY): Payer: Self-pay | Admitting: Occupational Therapy

## 2020-10-06 LAB — GLUCOSE, CAPILLARY
Glucose-Capillary: 140 mg/dL — ABNORMAL HIGH (ref 70–99)
Glucose-Capillary: 92 mg/dL (ref 70–99)
Glucose-Capillary: 120 mg/dL — ABNORMAL HIGH (ref 70–99)
Glucose-Capillary: 142 mg/dL — ABNORMAL HIGH (ref 70–99)

## 2020-10-06 MED ORDER — MELATONIN 5 MG PO TABS
5.0000 mg | ORAL_TABLET | Freq: Every day | ORAL | Status: DC
  Administered 2020-10-06 – 2020-10-09 (×4): 5 mg via ORAL
  Filled 2020-10-06 (×4): qty 1

## 2020-10-06 MED ORDER — METFORMIN HCL 500 MG PO TABS
250.0000 mg | ORAL_TABLET | Freq: Every day | ORAL | Status: DC
  Administered 2020-10-06 – 2020-10-10 (×5): 250 mg via ORAL
  Filled 2020-10-06 (×4): qty 1

## 2020-10-06 NOTE — Progress Notes (Signed)
[place] PHYSICAL MEDICINE & REHABILITATION PROGRESS NOTE   Subjective/Complaints: Patient seen sitting up in bed this morning.  States he slept fairly well overnight.  State he states he would like to change his sleeping.  He has questions regarding discharge medications.  Review of systems: Denies CP, SOB, N/V/D  Objective:   No results found. No results for input(s): WBC, HGB, HCT, PLT in the last 72 hours. No results for input(s): NA, K, CL, CO2, GLUCOSE, BUN, CREATININE, CALCIUM in the last 72 hours.  Intake/Output Summary (Last 24 hours) at 10/06/2020 0954 Last data filed at 10/06/2020 0444 Gross per 24 hour  Intake 480 ml  Output 2545 ml  Net -2065 ml        Physical Exam: Vital Signs Blood pressure 114/82, pulse 66, temperature 98 F (36.7 C), resp. rate 18, height 6\' 6"  (1.981 m), weight 129.5 kg, SpO2 97 %. Constitutional: No distress . Vital signs reviewed. HENT: Normocephalic.  Atraumatic. Eyes: EOMI. No discharge.  Left eye lipoma. Cardiovascular: No JVD.  RRR. Respiratory: Normal effort.  No stridor.  Bilateral clear to auscultation. GI: Non-distended.  BS +. Skin: Warm and dry.  Intact. Psych: Normal mood.  Normal behavior. Musc: No edema in extremities.  No tenderness in extremities. Neuro: Alert Motor: Bilateral upper extremities: 5/5 proximal distal Bilateral lower extremities: In flexion, knee extension 4+/5, ankle dorsiflexion 5/5, unchanged  Assessment/Plan: 1. Functional deficits secondary to debility  which require 3+ hours per day of interdisciplinary therapy in a comprehensive inpatient rehab setting.  Physiatrist is providing close team supervision and 24 hour management of active medical problems listed below.  Physiatrist and rehab team continue to assess barriers to discharge/monitor patient progress toward functional and medical goals  Care Tool:  Bathing    Body parts bathed by patient: Right arm, Left arm, Chest, Abdomen, Front  perineal area, Right upper leg, Left upper leg, Face, Buttocks, Right lower leg, Left lower leg   Body parts bathed by helper: Left lower leg     Bathing assist Assist Level: Supervision/Verbal cueing     Upper Body Dressing/Undressing Upper body dressing   What is the patient wearing?: Pull over shirt    Upper body assist Assist Level: Independent    Lower Body Dressing/Undressing Lower body dressing      What is the patient wearing?: Underwear/pull up, Pants     Lower body assist Assist for lower body dressing: Independent with assitive device     Toileting Toileting    Toileting assist Assist for toileting: Supervision/Verbal cueing     Transfers Chair/bed transfer  Transfers assist     Chair/bed transfer assist level: Supervision/Verbal cueing     Locomotion Ambulation   Ambulation assist      Assist level: Supervision/Verbal cueing Assistive device: No Device Max distance: 120'   Walk 10 feet activity   Assist     Assist level: Supervision/Verbal cueing Assistive device: No Device   Walk 50 feet activity   Assist Walk 50 feet with 2 turns activity did not occur: Safety/medical concerns (fatigue, LE weakness, decreased balance/postural control)  Assist level: Supervision/Verbal cueing Assistive device: No Device    Walk 150 feet activity   Assist Walk 150 feet activity did not occur: Safety/medical concerns (fatigue, LE weakness, decreased balance/postural control)  Assist level: Supervision/Verbal cueing Assistive device: No Device    Walk 10 feet on uneven surface  activity   Assist Walk 10 feet on uneven surfaces activity did not occur: Safety/medical concerns (  fatigue, LE weakness, decreased balance/postural control)   Assist level: Supervision/Verbal cueing     Wheelchair     Assist Will patient use wheelchair at discharge?: No Type of Wheelchair: Manual    Wheelchair assist level: Supervision/Verbal cueing Max  wheelchair distance: 57ft    Wheelchair 50 feet with 2 turns activity    Assist        Assist Level: Supervision/Verbal cueing   Wheelchair 150 feet activity     Assist      Assist Level: Maximal Assistance - Patient 25 - 49%    Medical Problem List and Plan: 1.Debilitysecondary torenal failure and  atrial fibrillation with RVR Acute encephalopathy/sepsis/multimedical.  Continue CIR  2. Antithrombotics: -DVT/anticoagulation:Eliquis -antiplatelet therapy: Plavix 75 mg daily 3. Pain Management:Tramadol as needed 4. Mood:Provide emotional support -antipsychotic agents: N/A 5. Neuropsych: This patientiscapable of making decisions on hisown behalf. 6. Skin/Wound Care:Routine skin checks  A&D ointment ordered for pruritus/dry skin-improving 7. Fluids/Electrolytes/Nutrition:Routine in and outs 8. Diastolic congestive heart failure. ContinueLasix 40 mg twice daily, Coreg CR80 mg daily, Entresto 97-103 mg twice daily. Follow-up cardiology service. Monitor for any signs of fluid overload  Daily weights ordered Filed Weights   09/30/20 0500 10/03/20 0500 10/06/20 0432  Weight: 133.3 kg 132.3 kg 129.5 kg   Improving on 10/23 9. A. fib with RVR. Status post DCCV 09/19/2020. Continue amiodarone as well as carvedilol controlled release to 80 mg/day Vitals:   10/05/20 1952 10/06/20 0432  BP:  114/82  Pulse: 70 66  Resp: 17 18  Temp:  98 F (36.7 C)  SpO2: 98% 97%   Rate controlled on 10/23 10. History of seizure disorder. Keppra 500 mg twice daily  No breakthrough seizures from admission to rehab-10/23 11. New onset diabetes mellitus. Hemoglobin A1c 6.9 on 10/8.   Levemir 5 units twice daily DC'd on 10/19.   Diabetic teaching CBG (last 3)  Recent Labs    10/05/20 1622 10/05/20 2103 10/06/20 0601  GLUCAP 119* 146* 140*   Metformin 250 start on 10/23  Elevated on 10/23 12. Hyperlipidemia.  Lipitor 13. Slow  transit constipation. MiraLAX twice daily, Colace twice daily  Does not want to increase medications at present, will continue to monitor 14.  Mild hypoalbuminemia  Supplement initiated on 10/18 15.  Acute blood loss anemia  Hemoglobin 10.1 on 10/18, labs ordered for Monday  Continue to monitor  16.  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  Sleep disturbance  Melatonin ordered on 10/20, increased on 10/22, increased again on 10/23  ECG reviewed, QTc elevated, will repeat ECG   LOS: 7 days A FACE TO FACE EVALUATION WAS PERFORMED  Chena Chohan Lorie Phenix 10/06/2020, 9:54 AM

## 2020-10-06 NOTE — Progress Notes (Signed)
Pt said he will wear the CPAP by himself.  

## 2020-10-06 NOTE — Progress Notes (Signed)
Occupational Therapy Session Note  Patient Details  Name: Gregory [name] MRN: [id number] Date of Birth: 21-Nov-1961  Today's Date: 10/06/2020 OT Individual Time: 1950-9326 OT Individual Time Calculation (min): 57 min   Short Term Goals: Week 1:  OT Short Term Goal 1 (Week 1): LTG=STG  mod I overall  Skilled Therapeutic Interventions/Progress Updates:    Pt greeted in the w/c with no c/o pain, requesting to shower. He ambulated without AD to gather his clothes and then brought them into bathroom with supervision assist. He first undressed while sitting on the toilet, also had +bladder void, unable to have BM though he tried. Supervision for transition to TTB where pt then bathed using LH sponge as needed, supervision for dynamic standing balance with no LOBs. Pt then ambulated to EOB where he proceeded with dressing tasks at sit<stand level without AD. Brought him a corner of a plastic bag which he used to don his Lt Ted hose successfully with supervision assistance alone. Note that pt required several rest breaks while donning footwear due to fatigue. Supervision for stand pivot to the w/c where he then completed oral care at independent level while seated. Pt declined standing to engage in sinkside activity due to fatigue. Pt remained sitting in the w/c with all needs within reach and chair alarm set. Tx focus placed on dynamic standing balance, activity tolerance, and ADL retraining.    Therapy Documentation Precautions:  Precautions Precautions: Fall Restrictions Weight Bearing Restrictions: No ADL: ADL Eating: Independent Upper Body Bathing: Setup Where Assessed-Upper Body Bathing: Shower Lower Body Bathing: Minimal assistance Where Assessed-Lower Body Bathing: Shower Upper Body Dressing: Minimal assistance Where Assessed-Upper Body Dressing: Edge of bed Lower Body Dressing: Minimal assistance (without footwear- can only up on right sock - needs ATEDS) Toileting: Minimal  assistance Toilet Transfer: Minimal assistance, Moderate assistance Social research officer, government: Minimal assistance Social research officer, government Method: Ambulating      Therapy/Group: Individual Therapy  Gregory [name] 10/06/2020, 12:38 PM

## 2020-10-06 NOTE — Progress Notes (Signed)
Physical Therapy Session Note  Patient Details  Name: Gregory [name] MRN: [id number] Date of Birth: 1961-04-25  Today's Date: 10/06/2020 PT Individual Time: 0801-0900 PT Individual Time Calculation (min): 59 min   Short Term Goals: Week 1:  PT Short Term Goal 1 (Week 1): pt will transfer stand<>pivot with LRAD and CGA PT Short Term Goal 2 (Week 1): Pt will ambulate 73ft with LRAD CGA PT Short Term Goal 3 (Week 1): Pt will navigate 8 steps with CGA  Skilled Therapeutic Interventions/Progress Updates:  Pt was seen bedside in the am. Pt transferred supine to edge of bed independently. Pt maintain balance on edge of bed with S. Pt able to don pants and TEDS hose as well as shoes. Pt ambulated around room with S and no assistive device.  Pt performed all transfers with S including toilet transfers. Pt transported to rehab gym. Pt ambulated 80 feet without assistive device and S. Pt ambulated 80 feet x 2 with st cane and S. Pt ascended/descended 20 and 8 stairs with B rails and S. Pt would benefit from St cane for long distance ambulation. Pt returned to room and left sitting up in w/c with chair alarm on.   Therapy Documentation Precautions:  Precautions Precautions: Fall Restrictions Weight Bearing Restrictions: No General:   Pain: No c/o pain.   Therapy/Group: Individual Therapy  Dub Amis 10/06/2020, 11:46 AM

## 2020-10-07 LAB — GLUCOSE, CAPILLARY
Glucose-Capillary: 118 mg/dL — ABNORMAL HIGH (ref 70–99)
Glucose-Capillary: 141 mg/dL — ABNORMAL HIGH (ref 70–99)
Glucose-Capillary: 100 mg/dL — ABNORMAL HIGH (ref 70–99)
Glucose-Capillary: 159 mg/dL — ABNORMAL HIGH (ref 70–99)

## 2020-10-07 MED ORDER — ZOLPIDEM TARTRATE 5 MG PO TABS
2.5000 mg | ORAL_TABLET | Freq: Every day | ORAL | Status: DC
  Administered 2020-10-07 – 2020-10-09 (×3): 2.5 mg via ORAL
  Filled 2020-10-07 (×3): qty 1

## 2020-10-07 NOTE — Progress Notes (Signed)
[place] PHYSICAL MEDICINE & REHABILITATION PROGRESS NOTE   Subjective/Complaints: Patient seen laying in bed this morning.  He states he did not sleep well overnight.  He notes that everything else is going well.  He wants to discuss medications again, to ensure that prior authorizations are taken care of prior to discharge.  Review of systems: Denies CP, SOB, N/V/D  Objective:   No results found. No results for input(s): WBC, HGB, HCT, PLT in the last 72 hours. No results for input(s): NA, K, CL, CO2, GLUCOSE, BUN, CREATININE, CALCIUM in the last 72 hours.  Intake/Output Summary (Last 24 hours) at 10/07/2020 0900 Last data filed at 10/07/2020 0753 Gross per 24 hour  Intake 740 ml  Output 2950 ml  Net -2210 ml        Physical Exam: Vital Signs Blood pressure (!) 122/94, pulse 72, temperature 97.9 F (36.6 C), resp. rate 18, height 6\' 6"  (1.981 m), weight 128.4 kg, SpO2 95 %. Constitutional: No distress . Vital signs reviewed. HENT: Normocephalic.  Atraumatic. Eyes: EOMI. No discharge.  Left eye lipoma. Cardiovascular: No JVD.  RRR. Respiratory: Normal effort.  No stridor.  Bilateral clear to auscultation. GI: Non-distended.  BS +. Skin: Warm and dry.  Intact. Psych: Normal mood.  Normal behavior. Musc: No edema in extremities.  No tenderness in extremities. Neuro: Alert Motor: Bilateral upper extremities: 5/5 proximal distal Bilateral lower extremities: In flexion, knee extension 4+/5, ankle dorsiflexion 5/5, stable  Assessment/Plan: 1. Functional deficits secondary to debility  which require 3+ hours per day of interdisciplinary therapy in a comprehensive inpatient rehab setting.  Physiatrist is providing close team supervision and 24 hour management of active medical problems listed below.  Physiatrist and rehab team continue to assess barriers to discharge/monitor patient progress toward functional and medical goals  Care Tool:  Bathing    Body parts bathed  by patient: Right arm, Left arm, Chest, Abdomen, Front perineal area, Right upper leg, Left upper leg, Face, Buttocks, Right lower leg, Left lower leg   Body parts bathed by helper: Left lower leg     Bathing assist Assist Level: Set up assist     Upper Body Dressing/Undressing Upper body dressing   What is the patient wearing?: Pull over shirt    Upper body assist Assist Level: Independent    Lower Body Dressing/Undressing Lower body dressing      What is the patient wearing?: Underwear/pull up, Pants     Lower body assist Assist for lower body dressing: Independent     Toileting Toileting    Toileting assist Assist for toileting: Supervision/Verbal cueing     Transfers Chair/bed transfer  Transfers assist     Chair/bed transfer assist level: Supervision/Verbal cueing     Locomotion Ambulation   Ambulation assist      Assist level: Supervision/Verbal cueing Assistive device: Cane-straight Max distance: 80   Walk 10 feet activity   Assist     Assist level: Supervision/Verbal cueing Assistive device: Cane-straight   Walk 50 feet activity   Assist Walk 50 feet with 2 turns activity did not occur: Safety/medical concerns (fatigue, LE weakness, decreased balance/postural control)  Assist level: Supervision/Verbal cueing Assistive device: Cane-straight    Walk 150 feet activity   Assist Walk 150 feet activity did not occur: Safety/medical concerns (fatigue, LE weakness, decreased balance/postural control)  Assist level: Supervision/Verbal cueing Assistive device: No Device    Walk 10 feet on uneven surface  activity   Assist Walk 10 feet on uneven  surfaces activity did not occur: Safety/medical concerns (fatigue, LE weakness, decreased balance/postural control)   Assist level: Supervision/Verbal cueing     Wheelchair     Assist Will patient use wheelchair at discharge?: No Type of Wheelchair: Manual    Wheelchair assist  level: Supervision/Verbal cueing Max wheelchair distance: 57ft    Wheelchair 50 feet with 2 turns activity    Assist        Assist Level: Supervision/Verbal cueing   Wheelchair 150 feet activity     Assist      Assist Level: Maximal Assistance - Patient 25 - 49%    Medical Problem List and Plan: 1.Debilitysecondary torenal failure and  atrial fibrillation with RVR Acute encephalopathy/sepsis/multimedical.  Continue CIR  2. Antithrombotics: -DVT/anticoagulation:Eliquis -antiplatelet therapy: Plavix 75 mg daily 3. Pain Management:Tramadol as needed 4. Mood:Provide emotional support -antipsychotic agents: N/A 5. Neuropsych: This patientiscapable of making decisions on hisown behalf. 6. Skin/Wound Care:Routine skin checks  A&D ointment ordered for pruritus/dry skin-improved 7. Fluids/Electrolytes/Nutrition:Routine in and outs 8. Diastolic congestive heart failure. ContinueLasix 40 mg twice daily, Coreg CR80 mg daily, Entresto 97-103 mg twice daily. Follow-up cardiology service. Monitor for any signs of fluid overload  Daily weights ordered Filed Weights   10/03/20 0500 10/06/20 0432 10/07/20 0532  Weight: 132.3 kg 129.5 kg 128.4 kg   Improving on 10/24 9. A. fib with RVR. Status post DCCV 09/19/2020. Continue amiodarone as well as carvedilol controlled release to 80 mg/day Vitals:   10/06/20 2123 10/07/20 0532  BP:  (!) 122/94  Pulse: 78 72  Resp: 20 18  Temp:  97.9 F (36.6 C)  SpO2: 95% 95%   Rate controlled on 10/23 10. History of seizure disorder. Keppra 500 mg twice daily  No breakthrough seizures from admission to rehab-10/24 11. New onset diabetes mellitus. Hemoglobin A1c 6.9 on 10/8.   Levemir 5 units twice daily DC'd on 10/19.   Diabetic teaching CBG (last 3)  Recent Labs    10/06/20 1740 10/06/20 2059 10/07/20 0602  GLUCAP 120* 142* 118*   Metformin 250 start on 10/23  Remains slightly  elevated on 10/24, however will not make changes at present given recent addition of medication 12. Hyperlipidemia.  Lipitor 13. Slow transit constipation. MiraLAX twice daily, Colace twice daily  Does not want to increase medications at present, will continue to monitor 14.  Mild hypoalbuminemia  Supplement initiated on 10/18 15.  Acute blood loss anemia  Hemoglobin 10.1 on 10/18, labs ordered for tomorrow  Continue to monitor  16.  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  Sleep disturbance  Melatonin ordered on 10/20, increased on 10/22, increased again on 10/23, [place] on 10/24  ECG reviewed, QTc elevated, repeat ECG reviewed, improved, but elevated  Ambien 2.5 ordered on 10/24   LOS: 8 days A FACE TO FACE EVALUATION WAS PERFORMED  Gregory [name] Lorie Phenix 10/07/2020, 9:00 AM

## 2020-10-08 ENCOUNTER — Inpatient Hospital Stay (HOSPITAL_COMMUNITY): Payer: Self-pay | Admitting: Occupational Therapy

## 2020-10-08 ENCOUNTER — Inpatient Hospital Stay (HOSPITAL_COMMUNITY): Payer: Self-pay

## 2020-10-08 ENCOUNTER — Telehealth: Payer: Self-pay | Admitting: Cardiovascular Disease

## 2020-10-08 DIAGNOSIS — R790 Abnormal level of blood mineral: Secondary | ICD-10-CM

## 2020-10-08 LAB — CBC WITH DIFFERENTIAL/PLATELET
Abs Immature Granulocytes: 0.03 10*3/uL (ref 0.00–0.07)
Basophils Absolute: 0 10*3/uL (ref 0.0–0.1)
Basophils Relative: 1 %
Eosinophils Absolute: 0.2 10*3/uL (ref 0.0–0.5)
Eosinophils Relative: 2 %
HCT: 32.6 % — ABNORMAL LOW (ref 39.0–52.0)
Hemoglobin: 10.5 g/dL — ABNORMAL LOW (ref 13.0–17.0)
Immature Granulocytes: 0 %
Lymphocytes Relative: 17 %
Lymphs Abs: 1.4 10*3/uL (ref 0.7–4.0)
MCH: 32.9 pg (ref 26.0–34.0)
MCHC: 32.2 g/dL (ref 30.0–36.0)
MCV: 102.2 fL — ABNORMAL HIGH (ref 80.0–100.0)
Monocytes Absolute: 0.7 10*3/uL (ref 0.1–1.0)
Monocytes Relative: 9 %
Neutro Abs: 5.6 10*3/uL (ref 1.7–7.7)
Neutrophils Relative %: 71 %
Platelets: 308 10*3/uL (ref 150–400)
RBC: 3.19 MIL/uL — ABNORMAL LOW (ref 4.22–5.81)
RDW: 16.4 % — ABNORMAL HIGH (ref 11.5–15.5)
WBC: 7.9 10*3/uL (ref 4.0–10.5)
nRBC: 0 % (ref 0.0–0.2)

## 2020-10-08 LAB — GLUCOSE, CAPILLARY
Glucose-Capillary: 117 mg/dL — ABNORMAL HIGH (ref 70–99)
Glucose-Capillary: 127 mg/dL — ABNORMAL HIGH (ref 70–99)
Glucose-Capillary: 90 mg/dL (ref 70–99)
Glucose-Capillary: 108 mg/dL — ABNORMAL HIGH (ref 70–99)

## 2020-10-08 NOTE — Discharge Instructions (Addendum)
Inpatient Rehab Discharge Instructions  Gregory [name] Discharge date and time: 10/10/20   Activities/Precautions/ Functional Status: Activity: activity as tolerated Diet: diabetic diet Wound Care: not needed   Functional status:  ___ No restrictions     ___ Walk up steps independently ___ 24/7 supervision/assistance   ___ Walk up steps with assistance _X__ Intermittent supervision/assistance  ___ Bathe/dress independently ___ Walk with walker     __x_ Bathe/dress with assistance ___ Walk Independently    ___ Shower independently ___ Walk with assistance    ___ Shower with assistance _X__ No alcohol     ___ Return to work/school ________  Special Instructions: 1. No driving, smoking or alcohol   COMMUNITY REFERRALS UPON DISCHARGE:    Outpatient: PT             Agency:[facility] Phone:[phone]              Appointment Date/Time:WILL CALL TO SET UP APPOINTMENT  Medical Equipment/Items Ordered:CANE                                                 Agency/Supplier:[employer]   My questions have been answered and I understand these instructions. I will adhere to these goals and the provided educational materials after my discharge from the hospital.  Patient/Caregiver Signature _______________________________ Date __________  Clinician Signature _______________________________________ Date __________  Please bring this form and your medication list with you to all your follow-up doctor's appointments.  ==========================================================  Information on my medicine - ELIQUIS (apixaban)  This medication education was reviewed with me or my healthcare representative as part of my discharge preparation  Why was Eliquis prescribed for you? Eliquis was prescribed for you to reduce the risk of a blood clot forming that can cause a stroke if you have a medical condition called atrial fibrillation (a type of  irregular heartbeat).  What do You need to know about Eliquis ? Take your Eliquis TWICE DAILY - one tablet in the morning and one tablet in the evening with or without food. If you have difficulty swallowing the tablet whole please discuss with your pharmacist how to take the medication safely.  Take Eliquis exactly as prescribed by your doctor and DO NOT stop taking Eliquis without talking to the doctor who prescribed the medication.  Stopping may increase your risk of developing a stroke.  Refill your prescription before you run out.  After discharge, you should have regular check-up appointments with your healthcare provider that is prescribing your Eliquis.  In the future your dose may need to be changed if your kidney function or weight changes by a significant amount or as you get older.  What do you do if you miss a dose? If you miss a dose, take it as soon as you remember on the same day and resume taking twice daily.  Do not take more than one dose of ELIQUIS at the same time to make up a missed dose.  Important Safety Information A possible side effect of Eliquis is bleeding. You should call your healthcare provider right away if you experience any of the following: ? Bleeding from an injury or your nose that does not stop. ? Unusual colored urine (red or dark brown) or unusual colored stools (red or black). ? Unusual bruising for unknown reasons. ? A serious fall or  if you hit your head (even if there is no bleeding).  Some medicines may interact with Eliquis and might increase your risk of bleeding or clotting while on Eliquis. To help avoid this, consult your healthcare provider or pharmacist prior to using any new prescription or non-prescription medications, including herbals, vitamins, non-steroidal anti-inflammatory drugs (NSAIDs) and supplements.  This website has more information on Eliquis (apixaban):  http://[email or web address].com/eliquis/home =====================================================    Carbohydrate Counting For People With Diabetes  Foods with carbohydrates make your blood glucose level go up. Learning how to count carbohydrates can help you control your blood glucose levels. First, identify the foods you eat that contain carbohydrates. Then, using the Foods with Carbohydrates chart, determine about how much carbohydrates are in your meals and snacks. Make sure you are eating foods with fiber, protein, and healthy fat along with your carbohydrate foods.  Foods with Carbohydrates The following table shows carbohydrate foods that have about 15 grams of carbohydrate each. Using measuring cups, spoons, or a food scale when you first begin learning about carbohydrate counting can help you learn about the portion sizes you typically eat. The following foods have 15 grams carbohydrate each:  Grains . 1 slice bread (1 ounce)  . 1 small tortilla (6-inch size)  .  large bagel (1 ounce)  . 1/3 cup pasta or rice (cooked)  .  hamburger or hot dog bun ( ounce)  .  cup cooked cereal  .  to  cup ready-to-eat cereal  . 2 taco shells (5-inch size) Fruit . 1 small fresh fruit ( to 1 cup)  .  medium banana  . 17 small grapes (3 ounces)  . 1 cup melon or berries  .  cup canned or frozen fruit  . 2 tablespoons dried fruit (blueberries, cherries, cranberries, raisins)  .  cup unsweetened fruit juice  Starchy Vegetables .  cup cooked beans, peas, corn, potatoes/sweet potatoes  .  large baked potato (3 ounces)  . 1 cup acorn or butternut squash  Snack Foods . 3 to 6 crackers  . 8 potato chips or 13 tortilla chips ( ounce to 1 ounce)  . 3 cups popped popcorn  Dairy . 3/4 cup (6 ounces) nonfat plain yogurt, or yogurt with sugar-free sweetener  . 1 cup milk  . 1 cup plain rice, soy, coconut or flavored almond milk Sweets and Desserts .  cup ice cream or frozen yogurt  . 1  tablespoon jam, jelly, pancake syrup, table sugar, or honey  . 2 tablespoons light pancake syrup  . 1 inch square of frosted cake or 2 inch square of unfrosted cake  . 2 small cookies (2/3 ounce each) or  large cookie   Sometimes you'll have to estimate carbohydrate amounts if you don't know the exact recipe. One cup of mixed foods like soups can have 1 to 2 carbohydrate servings, while some casseroles might have 2 or more servings of carbohydrate. Foods that have less than 20 calories in each serving can be counted as "free" foods. Count 1 cup raw vegetables, or  cup cooked non-starchy vegetables as "free" foods. If you eat 3 or more servings at one meal, then count them as 1 carbohydrate serving.   Foods without Carbohydrates  Not all foods contain carbohydrates. Meat, some dairy, fats, non-starchy vegetables, and many beverages don't contain carbohydrate. So when you count carbohydrates, you can generally exclude chicken, pork, beef, fish, seafood, eggs, tofu, cheese, butter, sour cream, avocado, nuts, seeds, olives, mayonnaise, water, black coffee,  unsweetened tea, and zero-calorie drinks. Vegetables with no or low carbohydrate include green beans, cauliflower, tomatoes, and onions.  How much carbohydrate should I eat at each meal?  Carbohydrate counting can help you plan your meals and manage your weight. Following are some starting points for carbohydrate intake at each meal. Work with your registered dietitian nutritionist to find the best range that works for your blood glucose and weight.    To Lose Weight To Maintain Weight  Women 2 - 3 carb servings 3 - 4 carb servings  Men 3 - 4 carb servings 4 - 5 carb servings   Checking your blood glucose after meals will help you know if you need to adjust the timing, type, or number of carbohydrate servings in your meal plan. Achieve and keep a healthy body weight by balancing your food intake and physical activity.  Tips  How should I plan  my meals?  Plan for half the food on your plate to include non-starchy vegetables, like salad greens, broccoli, or carrots. Try to eat 3 to 5 servings of non-starchy vegetables every day. Have a protein food at each meal. Protein foods include chicken, fish, meat, eggs, or beans (note that beans contain carbohydrate). These two food groups (non-starchy vegetables and proteins) are low in carbohydrate. If you fill up your plate with these foods, you will eat less carbohydrate but still fill up your stomach. Try to limit your carbohydrate portion to  of the plate.   What fats are healthiest to eat?  Diabetes increases risk for heart disease. To help protect your heart, eat more healthy fats, such as olive oil, nuts, and avocado. Eat less saturated fats like butter, cream, and high-fat meats, like bacon and sausage. Avoid trans fats, which are in all foods that list "partially hydrogenated oil" as an ingredient.  What should I drink?  Choose drinks that are not sweetened with sugar. The healthiest choices are water, carbonated or seltzer waters, and tea and coffee without added sugars.  Sweet drinks will make your blood glucose go up very quickly. One serving of soda or energy drink is  cup. It is best to drink these beverages only if your blood glucose is low.  Artificially sweetened, or diet drinks, typically do not increase your blood glucose if they have zero calories in them. Read labels of beverages, as some diet drinks do have carbohydrate and will raise your blood glucose.  Label Reading Tips Read Nutrition Facts labels to find out how many grams of carbohydrate are in a food you want to eat. Don't forget: sometimes serving sizes on the label aren't the same as how much food you are going to eat, so you may need to calculate how much carbohydrate is in the food you are serving yourself.   Carbohydrate Counting for People with Diabetes Sample 1-Day Menu  Breakfast  cup yogurt, low fat, low  sugar (1 carbohydrate serving)   cup cereal, ready-to-eat, unsweetened (1 carbohydrate serving)  1 cup strawberries (1 carbohydrate serving)   cup almonds ( carbohydrate serving)  Lunch 1, 5 ounce can chunk light tuna  2 ounces cheese, low fat cheddar  6 whole wheat crackers (1 carbohydrate serving)  1 small apple (1 carbohydrate servings)   cup carrots ( carbohydrate serving)   cup snap peas  1 cup 1% milk (1 carbohydrate serving)   Evening Meal Stir fry made with: 3 ounces chicken  1 cup brown rice (3 carbohydrate servings)   cup broccoli (  carbohydrate serving)   cup green beans   cup onions  1 tablespoon olive oil  2 tablespoons teriyaki sauce ( carbohydrate serving)  Evening Snack 1 extra small banana (1 carbohydrate serving)  1 tablespoon peanut butter   Carbohydrate Counting for People with Diabetes Vegan Sample 1-Day Menu  Breakfast 1 cup cooked oatmeal (2 carbohydrate servings)   cup blueberries (1 carbohydrate serving)  2 tablespoons flaxseeds  1 cup soymilk fortified with calcium and vitamin D  1 cup coffee  Lunch 2 slices whole wheat bread (2 carbohydrate servings)   cup baked tofu   cup lettuce  2 slices tomato  2 slices avocado   cup baby carrots ( carbohydrate serving)  1 orange (1 carbohydrate serving)  1 cup soymilk fortified with calcium and vitamin D   Evening Meal Burrito made with: 1 6-inch corn tortilla (1 carbohydrate serving)  1 cup refried vegetarian beans (2 carbohydrate servings)   cup chopped tomatoes   cup lettuce   cup salsa  1/3 cup brown rice (1 carbohydrate serving)  1 tablespoon olive oil for rice   cup zucchini   Evening Snack 6 small whole grain crackers (1 carbohydrate serving)  2 apricots ( carbohydrate serving)   cup unsalted peanuts ( carbohydrate serving)    Carbohydrate Counting for People with Diabetes Vegetarian (Lacto-Ovo) Sample 1-Day Menu  Breakfast 1 cup cooked oatmeal (2 carbohydrate servings)    cup blueberries (1 carbohydrate serving)  2 tablespoons flaxseeds  1 egg  1 cup 1% milk (1 carbohydrate serving)  1 cup coffee  Lunch 2 slices whole wheat bread (2 carbohydrate servings)  2 ounces low-fat cheese   cup lettuce  2 slices tomato  2 slices avocado   cup baby carrots ( carbohydrate serving)  1 orange (1 carbohydrate serving)  1 cup unsweetened tea  Evening Meal Burrito made with: 1 6-inch corn tortilla (1 carbohydrate serving)   cup refried vegetarian beans (1 carbohydrate serving)   cup tomatoes   cup lettuce   cup salsa  1/3 cup brown rice (1 carbohydrate serving)  1 tablespoon olive oil for rice   cup zucchini  1 cup 1% milk (1 carbohydrate serving)  Evening Snack 6 small whole grain crackers (1 carbohydrate serving)  2 apricots ( carbohydrate serving)   cup unsalted peanuts ( carbohydrate serving)    Copyright 2020  Academy of [employer]etics. All rights reserved.  Using Nutrition Labels: Carbohydrate  . Serving Size  . Look at the serving size. All the information on the label is based on this portion. Randol Kern Per Container  . The number of servings contained in the package. . Guidelines for Carbohydrate  . Look at the total grams of carbohydrate in the serving size.  . 1 carbohydrate choice = 15 grams of carbohydrate.  Range of Carbohydrate Grams Per Choice  Carbohydrate Grams/Choice Carbohydrate Choices  6-10   11-20 1  21-25 1  26-35 2  36-40 2  41-50 3  51-55 3  56-65 4  66-70 4  71-80 5    Copyright 2020  Academy of [employer] and Dietetics. All rights reserved.

## 2020-10-08 NOTE — Telephone Encounter (Signed)
Patient has an appointment with Gregory [name] on 10/28. I spoke with the patient who is currently in the hospital. I have cancelled his appointment and he will call back to reschedule once he is discharged.

## 2020-10-08 NOTE — Progress Notes (Signed)
Physical Therapy Session Note  Patient Details  Name: Gregory [name] MRN: [id number] Date of Birth: 1961-06-03  Today's Date: 10/08/2020 PT Individual Time: 3500-9381 and 8299-3716 PT Individual Time Calculation (min): 54 min and 54 min  Short Term Goals: Week 1:  PT Short Term Goal 1 (Week 1): pt will transfer stand<>pivot with LRAD and CGA PT Short Term Goal 2 (Week 1): Pt will ambulate 49ft with LRAD CGA PT Short Term Goal 3 (Week 1): Pt will navigate 8 steps with CGA  Skilled Therapeutic Interventions/Progress Updates:   Treatment Session 1: 9678-9381 54 min Received pt supine in bed, pt agreeable to therapy, and denied any pain during session but reported soreness in bilateral quads from navigating 20 steps over the weekend. Session with emphasis on functional mobility/transfers, dressing, toileting, generalized strengthening, dynamic standing balance/coordination, ambulation, stair navigation, and improved activity tolerance. Pt performed bed mobility mod I and donned ted hose, shirt, pants, and shoes sitting EOB with supervision and increased time. Pt reported nursing out of stock of one of his heart rate medications and therefore requesting to keep a close eye on his HR throughout session. Pt ambulated 39ft x 2 trials to/from bathroom with AD and close supervision and able to manage clothes with supervision. Pt able to void, have BM, and perform peri-care independently. Pt sat in [place] and washed hands, brushed teeth, and washed face independently. Pt ambulated 122ft x 2 trials with [facility] and close supervision to/from therapy gym. HR 91bpm. Pt stood and picked up small cup from floor without AD and supervision with no LOB noted. Pt ambulated additional 33ft x 2 trials to/from staircase and navigated 12 steps with 2 rails with supervision ascending and descending with a step through pattern. HR 93bpm after activity. Worked on dynamic standing balance tossing 4.4lb medicine ball against  rebounder 2x20 with close supervision and narrow BOS. Pt reported 7/10 fatigue after activity and required multiple extended rest breaks throughout session. Concluded session with pt sitting in WC, needs within reach, and chair pad alarm on. Therapist provided fresh drink for pt.   Treatment Session 2: 1402-1456 54 min Received pt supine in bed, pt agreeable to therapy, and denied any pain during session but reported fatigue from earlier therapy. Session with emphasis on functional mobility/transfers, generalized strengthening, dynamic standing balance/coordination, ambulation, and improved activity tolerance. Pt performed bed mobility mod I and donned pants and shoes sitting EOB with supervision. Pt ambulated 165ft x 2 trials to/from dayroom with [facility] and close supervision. Pt performed bilateral LE strengthening on Nustep at workload 6 for 10 minutes for a total of 394 steps for improved cardiovascular endurance. Pt performed standing LE strengthening on Kinetron at 15 cm/sec for 1 minute x 2 trials with bilateral UE support and close supervision. Pt reported 6-7/10 fatigue after activity and requested to perform upper body strengthening exercises and performed the following exercises sitting with supervision and verbal cues for technique: -overhead shoulder press with 15lb dowel 2x15 -horizontal chest press at 90 degrees with 5lb dowel 2x15 Pt required multiple extended rest/water breaks throughout session due to increased fatigue. Pt doffed pants and shoes with supervision and transferred sit<>supine mod I. Concluded session with pt supine in bed, needs within reach, and bed alarm on.    Therapy Documentation Precautions:  Precautions Precautions: Fall Restrictions Weight Bearing Restrictions: No   Therapy/Group: Individual Therapy Alfonse Alpers PT, DPT   10/08/2020, 7:17 AM

## 2020-10-08 NOTE — Progress Notes (Signed)
Occupational Therapy Session Note  Patient Details  Name: Gregory [name] MRN: [id number] Date of Birth: February 05, 1961  Today's Date: 10/08/2020 OT Individual Time: 7858-8502 OT Individual Time Calculation (min): 60 min   Short Term Goals: Week 1:  OT Short Term Goal 1 (Week 1): LTG=STG  mod I overall  Skilled Therapeutic Interventions/Progress Updates:    Pt greeted in the w/c with no c/o pain. Agreeable to shower. Pt gathered needed items for shower and dressing after with cues to initiate. Pt ambulated in room/bathroom at independent level. He completed toileting, bathing, dressing, and grooming tasks with independence-Modified independence. He then gathered soiled laundry in bags while w/c level. Discussed IADL roles at home with pt reporting that he will share these roles with his son, feels confident in his ability to assist. Discussed f/u and DME needs as well. At end of session pt transferred to bed and was left with all needs within reach and bed alarm set.   Therapy Documentation Precautions:  Precautions Precautions: Fall Restrictions Weight Bearing Restrictions: No ADL: ADL Eating: Independent Upper Body Bathing: Setup Where Assessed-Upper Body Bathing: Shower Lower Body Bathing: Minimal assistance Where Assessed-Lower Body Bathing: Shower Upper Body Dressing: Minimal assistance Where Assessed-Upper Body Dressing: Edge of bed Lower Body Dressing: Minimal assistance (without footwear- can only up on right sock - needs ATEDS) Toileting: Minimal assistance Toilet Transfer: Minimal assistance, Moderate assistance Social research officer, government: Minimal assistance Social research officer, government Method: Ambulating   Therapy/Group: Individual Therapy  Rolinda Impson A Jhonathan Desroches 10/08/2020, 12:19 PM

## 2020-10-08 NOTE — Telephone Encounter (Signed)
New message:     Patient calling stating that some one keeps calling him concering a apt. Patient in hospital.

## 2020-10-08 NOTE — Progress Notes (Signed)
[place] PHYSICAL MEDICINE & REHABILITATION PROGRESS NOTE   Subjective/Complaints: No complaints this morning. Denies pain, constipation, insomnia.  Wants to ensure medications get prior auth prior to d/c Slept better last night  Review of systems: Denies CP, SOB, N/V/D  Objective:   No results found. Recent Labs    10/08/20 0701  WBC 7.9  HGB 10.5*  HCT 32.6*  PLT 308   No results for input(s): NA, K, CL, CO2, GLUCOSE, BUN, CREATININE, CALCIUM in the last 72 hours.  Intake/Output Summary (Last 24 hours) at 10/08/2020 1251 Last data filed at 10/08/2020 1200 Gross per 24 hour  Intake 800 ml  Output 3175 ml  Net -2375 ml        Physical Exam: Vital Signs Blood pressure (!) 136/106, pulse 65, temperature 98.1 F (36.7 C), temperature source Oral, resp. rate 16, height 6\' 6"  (1.981 m), weight 127.3 kg, SpO2 95 %.   General: Alert, No apparent distress HEENT: Head is normocephalic, Left eye lipoma. Heart: Reg rate and rhythm. No murmurs rubs or gallops Chest: CTA bilaterally without wheezes, rales, or rhonchi; no distress Abdomen: Soft, non-tender, non-distended, bowel sounds positive. Extremities: No clubbing, cyanosis, or edema. Pulses are 2+ Skin: Clean and intact without signs of breakdown Neuro: Alert Motor: Bilateral upper extremities: 5/5 proximal distal Bilateral lower extremities: In flexion, knee extension 4+/5, ankle dorsiflexion 5/5, stable   Assessment/Plan: 1. Functional deficits secondary to debility  which require 3+ hours per day of interdisciplinary therapy in a comprehensive inpatient rehab setting.  Physiatrist is providing close team supervision and 24 hour management of active medical problems listed below.  Physiatrist and rehab team continue to assess barriers to discharge/monitor patient progress toward functional and medical goals  Care Tool:  Bathing    Body parts bathed by patient: Right arm, Left arm, Chest, Abdomen, Front  perineal area, Right upper leg, Left upper leg, Face, Buttocks, Right lower leg, Left lower leg   Body parts bathed by helper: Left lower leg     Bathing assist Assist Level: Independent with assistive device     Upper Body Dressing/Undressing Upper body dressing   What is the patient wearing?: Pull over shirt    Upper body assist Assist Level: Independent    Lower Body Dressing/Undressing Lower body dressing      What is the patient wearing?: Underwear/pull up, Pants     Lower body assist Assist for lower body dressing: Independent     Toileting Toileting    Toileting assist Assist for toileting: Independent     Transfers Chair/bed transfer  Transfers assist     Chair/bed transfer assist level: Supervision/Verbal cueing     Locomotion Ambulation   Ambulation assist      Assist level: Supervision/Verbal cueing Assistive device: Cane-straight Max distance: 166ft   Walk 10 feet activity   Assist     Assist level: Supervision/Verbal cueing Assistive device: Cane-straight   Walk 50 feet activity   Assist Walk 50 feet with 2 turns activity did not occur: Safety/medical concerns (fatigue, LE weakness, decreased balance/postural control)  Assist level: Supervision/Verbal cueing Assistive device: Cane-straight    Walk 150 feet activity   Assist Walk 150 feet activity did not occur: Safety/medical concerns (fatigue, LE weakness, decreased balance/postural control)  Assist level: Supervision/Verbal cueing Assistive device: Cane-straight    Walk 10 feet on uneven surface  activity   Assist Walk 10 feet on uneven surfaces activity did not occur: Safety/medical concerns (fatigue, LE weakness, decreased balance/postural control)  Assist level: Supervision/Verbal cueing     Wheelchair     Assist Will patient use wheelchair at discharge?: No Type of Wheelchair: Manual    Wheelchair assist level: Supervision/Verbal cueing Max  wheelchair distance: 77ft    Wheelchair 50 feet with 2 turns activity    Assist        Assist Level: Supervision/Verbal cueing   Wheelchair 150 feet activity     Assist      Assist Level: Maximal Assistance - Patient 25 - 49%    Medical Problem List and Plan: 1.Debilitysecondary torenal failure and  atrial fibrillation with RVR Acute encephalopathy/sepsis/multimedical.  Continue CIR  2. Antithrombotics: -DVT/anticoagulation:Eliquis -antiplatelet therapy: Plavix 75 mg daily 3. Pain Management:Tramadol as needed 4. Mood:Provide emotional support -antipsychotic agents: N/A 5. Neuropsych: This patientiscapable of making decisions on hisown behalf. 6. Skin/Wound Care:Routine skin checks  A&D ointment ordered for pruritus/dry skin-improved 7. Fluids/Electrolytes/Nutrition:Routine in and outs 8. Diastolic congestive heart failure. ContinueLasix 40 mg twice daily, Coreg CR80 mg daily, Entresto 97-103 mg twice daily. Follow-up cardiology service. Monitor for any signs of fluid overload  Daily weights ordered Filed Weights   10/06/20 0432 10/07/20 0532 10/08/20 0500  Weight: 129.5 kg 128.4 kg 127.3 kg   Improving on 10/24 9. A. fib with RVR. Status post DCCV 09/19/2020. Continue amiodarone as well as carvedilol controlled release to 80 mg/day Vitals:   10/08/20 0551 10/08/20 0700  BP: (!) 132/95 (!) 136/106  Pulse: 66 65  Resp: 16   Temp: 98.1 F (36.7 C)   SpO2: 95%    Rate controlled on 10/23 10. History of seizure disorder. Keppra 500 mg twice daily  No breakthrough seizures from admission to rehab-10/24 11. New onset diabetes mellitus. Hemoglobin A1c 6.9 on 10/8.   Levemir 5 units twice daily DC'd on 10/19.   Diabetic teaching CBG (last 3)  Recent Labs    10/07/20 2047 10/08/20 0552 10/08/20 1119  GLUCAP 159* 117* 127*   Metformin 250 start on 10/23  Remains slightly elevated on 10/25, 117-157 12.  Hyperlipidemia.  Lipitor 13. Slow transit constipation. MiraLAX twice daily, Colace twice daily  Does not want to increase medications at present, will continue to monitor 14.  Mild hypoalbuminemia  Supplement initiated on 10/18 15.  Acute blood loss anemia  Hemoglobin 10.1 on 10/18, up to 10.5 on 10/25  Continue to monitor  16.  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  Sleep disturbance  Melatonin ordered on 10/20, increased on 10/22, increased again on 10/23, [place] on 10/24  ECG reviewed, QTc elevated, repeat ECG reviewed, improved, but elevated  Ambien 2.5 ordered on 10/24  Improved with Ambien   LOS: 9 days A FACE TO FACE EVALUATION WAS PERFORMED  Martha Clan P Tyniya Kuyper 10/08/2020, 12:51 PM

## 2020-10-08 NOTE — Progress Notes (Addendum)
Patient ID: Gregory [name], male   DOB: 1961-07-27, 59 y.o.   MRN: [id number]  Gave [employer] form for prior auth for pt's medications for Workers Comp. Spoke with [name] adjuster regarding need for OPPT and cane.   3:13 PM Faxed medications for prior auth to [name] in preparation for discharge 10/27.

## 2020-10-09 ENCOUNTER — Inpatient Hospital Stay (HOSPITAL_COMMUNITY): Payer: Self-pay

## 2020-10-09 ENCOUNTER — Inpatient Hospital Stay (HOSPITAL_COMMUNITY): Payer: Self-pay | Admitting: Occupational Therapy

## 2020-10-09 LAB — GLUCOSE, CAPILLARY
Glucose-Capillary: 119 mg/dL — ABNORMAL HIGH (ref 70–99)
Glucose-Capillary: 104 mg/dL — ABNORMAL HIGH (ref 70–99)
Glucose-Capillary: 114 mg/dL — ABNORMAL HIGH (ref 70–99)
Glucose-Capillary: 147 mg/dL — ABNORMAL HIGH (ref 70–99)

## 2020-10-09 NOTE — Progress Notes (Signed)
Occupational Therapy Session Note  Patient Details  Name: Dontarius Sheley MRN: [id number] Date of Birth: [birth year]  Today's Date: 10/09/2020 OT Individual Time: 1000-1048 OT Individual Time Calculation (min): 48 min    Short Term Goals: Week 1:  OT Short Term Goal 1 (Week 1): LTG=STG  mod I overall  Skilled Therapeutic Interventions/Progress Updates:    patient seated in w/c, alert and ready for therapy - he has gathered his clothing and requests to shower/dress this session.  Patient demonstrates independence with grooming tasks seated, functional mobility/transfers with SPC mod I and bathing/dressing/toileting mod I utilizing energy conservation and appropriate DME/assistive device use.    He states that he feels prepared for discharge to home tomorrow  Therapy Documentation Precautions:  Precautions Precautions: Fall Restrictions Weight Bearing Restrictions: No   Therapy/Group: Individual Therapy  Carlos Levering 10/09/2020, 7:39 AM

## 2020-10-09 NOTE — Progress Notes (Signed)
Occupational Therapy Discharge Summary  Patient Details  Name: Gregory [name] MRN: [id number] Date of Birth: 1961-03-15     Patient has met 12 of 12 long term goals due to improved activity tolerance, improved balance, postural control and improved coordination.  Patient to discharge at overall Modified Independent level.  Patient's care partner is independent to provide the necessary physical assistance at discharge.    Reasons goals not met: na  Recommendation:  No ongoing OT needed at this time  Equipment: No equipment provided  Reasons for discharge: treatment goals met  Patient/family agrees with progress made and goals achieved: Yes  OT Discharge Precautions/Restrictions  Precautions Precautions: Fall Restrictions Weight Bearing Restrictions: No General   Vital Signs Therapy Vitals Pulse Rate: 74 BP: (!) 127/93 Pain Pain Assessment Pain Scale: 0-10 Pain Score: 0-No pain Patients Stated Pain Goal: 3 ADL ADL Eating: Independent Grooming: Independent Upper Body Bathing: Independent Where Assessed-Upper Body Bathing: Shower Lower Body Bathing: Independent Where Assessed-Lower Body Bathing: Shower Upper Body Dressing: Independent Where Assessed-Upper Body Dressing: Edge of bed Lower Body Dressing: Independent Where Assessed-Lower Body Dressing: Edge of bed Toileting: Independent Where Assessed-Toileting: Glass blower/designer: Diplomatic Services operational officer Method: Clinical biochemist Method: Heritage manager: Radio broadcast assistant Vision Baseline Vision/History: Wears glasses Wears Glasses: At all times Vision Assessment?: No apparent visual deficits Perception  Perception: Within Functional Limits Praxis Praxis: Intact Cognition Overall Cognitive Status: Within Functional Limits for tasks assessed Arousal/Alertness: Awake/alert Orientation Level:  Oriented X4 Memory: Appears intact Awareness: Appears intact Problem Solving: Appears intact Safety/Judgment: Appears intact Sensation Sensation Light Touch: Appears Intact Hot/Cold: Appears Intact Proprioception: Appears Intact Coordination Gross Motor Movements are Fluid and Coordinated: No Fine Motor Movements are Fluid and Coordinated: Yes Coordination and Movement Description: mild uncoordination due to generalized weakness; improved since eval. Finger Nose Finger Test: [facility] bilaterally Heel Shin Test: [facility] bilaterally Motor  Motor Motor: Within Functional Limits Motor - Skilled Clinical Observations: mild uncoordination due to generalized weakness; improved since eval. Mobility  Bed Mobility Bed Mobility: Rolling Right;Rolling Left;Supine to Sit;Sit to Supine Rolling Right: Independent Rolling Left: Independent Supine to Sit: Independent Sit to Supine: Independent Transfers Sit to Stand: Independent with assistive device Stand to Sit: Independent with assistive device  Trunk/Postural Assessment  Cervical Assessment Cervical Assessment: Within Functional Limits Thoracic Assessment Thoracic Assessment: Within Functional Limits Lumbar Assessment Lumbar Assessment: Within Functional Limits Postural Control Postural Control: Within Functional Limits  Balance Balance Balance Assessed: Yes Standardized Balance Assessment Standardized Balance Assessment: Berg Balance Test Berg Balance Test Sit to Stand: Able to stand  independently using hands Standing Unsupported: Able to stand safely 2 minutes Sitting with Back Unsupported but Feet Supported on Floor or Stool: Able to sit safely and securely 2 minutes Stand to Sit: Sits safely with minimal use of hands Transfers: Able to transfer safely, definite need of hands Standing Unsupported with Eyes Closed: Able to stand 10 seconds with supervision Standing Ubsupported with Feet Together: Able to place feet together  independently and stand for 1 minute with supervision From Standing, Reach Forward with Outstretched Arm: Can reach forward >12 cm safely (5") From Standing Position, Pick up Object from Floor: Able to pick up shoe, needs supervision From Standing Position, Turn to Look Behind Over each Shoulder: Looks behind from both sides and weight shifts well Turn 360 Degrees: Able to turn 360 degrees safely but slowly Standing Unsupported, Alternately Place Feet on Step/Stool: Able to stand independently  and safely and complete 8 steps in 20 seconds Standing Unsupported, One Foot in Front: Able to take small step independently and hold 30 seconds Standing on One Leg: Tries to lift leg/unable to hold 3 seconds but remains standing independently Total Score: 43 Static Sitting Balance Static Sitting - Balance Support: Feet supported;No upper extremity supported Static Sitting - Level of Assistance: 7: Independent Dynamic Sitting Balance Dynamic Sitting - Balance Support: Feet supported;No upper extremity supported Dynamic Sitting - Level of Assistance: 7: Independent Static Standing Balance Static Standing - Balance Support: Right upper extremity supported (SPC) Static Standing - Level of Assistance: 6: Modified independent (Device/Increase time) Dynamic Standing Balance Dynamic Standing - Balance Support: Right upper extremity supported (SPC) Dynamic Standing - Level of Assistance: 6: Modified independent (Device/Increase time) Extremity/Trunk Assessment RUE Assessment RUE Assessment: Within Functional Limits LUE Assessment LUE Assessment: Within Functional Limits   Gregory [name] 10/09/2020, 10:52 AM

## 2020-10-09 NOTE — Progress Notes (Signed)
[place] PHYSICAL MEDICINE & REHABILITATION PROGRESS NOTE   Subjective/Complaints: No complaints this morning. Denies pain, constipation, insomnia.   Review of systems: Denies CP, SOB, N/V/D  Objective:   No results found. Recent Labs    10/08/20 0701  WBC 7.9  HGB 10.5*  HCT 32.6*  PLT 308   No results for input(s): NA, K, CL, CO2, GLUCOSE, BUN, CREATININE, CALCIUM in the last 72 hours.  Intake/Output Summary (Last 24 hours) at 10/09/2020 1356 Last data filed at 10/09/2020 0900 Gross per 24 hour  Intake 360 ml  Output 2375 ml  Net -2015 ml        Physical Exam: Vital Signs Blood pressure (!) 127/93, pulse 74, temperature 97.8 F (36.6 C), temperature source Oral, resp. rate 17, height 6\' 6"  (1.981 m), weight 125.7 kg, SpO2 96 %.   General: Alert and oriented x 3, No apparent distress HEENT: Head is normocephalic, atraumatic, PERRLA, EOMI, sclera anicteric, oral mucosa pink and moist, dentition intact, ext ear canals clear,  Neck: Supple without JVD or lymphadenopathy Heart: Reg rate and rhythm. No murmurs rubs or gallops Chest: CTA bilaterally without wheezes, rales, or rhonchi; no distress Abdomen: Soft, non-tender, non-distended, bowel sounds positive. Extremities: No clubbing, cyanosis, or edema. Pulses are 2+ Skin: Clean and intact without signs of breakdown  Motor: Bilateral upper extremities: 5/5 proximal distal Bilateral lower extremities: In flexion, knee extension 4+/5, ankle dorsiflexion 5/5, stable   Assessment/Plan: 1. Functional deficits secondary to debility  which require 3+ hours per day of interdisciplinary therapy in a comprehensive inpatient rehab setting.  Physiatrist is providing close team supervision and 24 hour management of active medical problems listed below.  Physiatrist and rehab team continue to assess barriers to discharge/monitor patient progress toward functional and medical goals  Care Tool:  Bathing    Body parts  bathed by patient: Right arm, Left arm, Chest, Abdomen, Front perineal area, Right upper leg, Left upper leg, Face, Buttocks, Right lower leg, Left lower leg   Body parts bathed by helper: Left lower leg     Bathing assist Assist Level: Independent with assistive device     Upper Body Dressing/Undressing Upper body dressing   What is the patient wearing?: Pull over shirt    Upper body assist Assist Level: Independent    Lower Body Dressing/Undressing Lower body dressing      What is the patient wearing?: Underwear/pull up, Pants     Lower body assist Assist for lower body dressing: Independent     Toileting Toileting    Toileting assist Assist for toileting: Independent     Transfers Chair/bed transfer  Transfers assist     Chair/bed transfer assist level: Independent with assistive device Chair/bed transfer assistive device: Research officer, political party   Ambulation assist      Assist level: Independent with assistive device Assistive device: Cane-straight Max distance: >137ft   Walk 10 feet activity   Assist     Assist level: Independent with assistive device Assistive device: Cane-straight   Walk 50 feet activity   Assist Walk 50 feet with 2 turns activity did not occur: Safety/medical concerns (fatigue, LE weakness, decreased balance/postural control)  Assist level: Independent with assistive device Assistive device: Cane-straight    Walk 150 feet activity   Assist Walk 150 feet activity did not occur: Safety/medical concerns (fatigue, LE weakness, decreased balance/postural control)  Assist level: Independent with assistive device Assistive device: Cane-straight    Walk 10 feet on uneven surface  activity  Assist Walk 10 feet on uneven surfaces activity did not occur: Safety/medical concerns (fatigue, LE weakness, decreased balance/postural control)   Assist level: Supervision/Verbal cueing Assistive device: Cane-straight    Wheelchair     Assist Will patient use wheelchair at discharge?: No Type of Wheelchair: Manual    Wheelchair assist level: Independent Max wheelchair distance: >134ft    Wheelchair 50 feet with 2 turns activity    Assist        Assist Level: Independent   Wheelchair 150 feet activity     Assist      Assist Level: Independent    Medical Problem List and Plan: 1.Debilitysecondary torenal failure and  atrial fibrillation with RVR Acute encephalopathy/sepsis/multimedical.  Continue CIR  2. Antithrombotics: -DVT/anticoagulation:Eliquis -antiplatelet therapy: Plavix 75 mg daily 3. Pain Management:Tramadol as needed 4. Mood:Provide emotional support -antipsychotic agents: N/A 5. Neuropsych: This patientiscapable of making decisions on hisown behalf. 6. Skin/Wound Care:Routine skin checks  A&D ointment ordered for pruritus/dry skin-improved 7. Fluids/Electrolytes/Nutrition:Routine in and outs 8. Diastolic congestive heart failure. ContinueLasix 40 mg twice daily, Coreg CR80 mg daily, Entresto 97-103 mg twice daily. Follow-up cardiology service. Monitor for any signs of fluid overload  Daily weights ordered Filed Weights   10/07/20 0532 10/08/20 0500 10/09/20 0500  Weight: 128.4 kg 127.3 kg 125.7 kg   Much better 10/26 9. A. fib with RVR. Status post DCCV 09/19/2020. Continue amiodarone as well as carvedilol controlled release to 80 mg/day Vitals:   10/09/20 0303 10/09/20 0700  BP: 131/86 (!) 127/93  Pulse: 68 74  Resp: 17   Temp: 97.8 F (36.6 C)   SpO2: 96%    Rate controlled 10/26 10. History of seizure disorder. Keppra 500 mg twice daily  No breakthrough seizures from admission to rehab-10/24 11. New onset diabetes mellitus. Hemoglobin A1c 6.9 on 10/8.   Levemir 5 units twice daily DC'd on 10/19.   Diabetic teaching CBG (last 3)  Recent Labs    10/08/20 2116 10/09/20 0609 10/09/20 1207   GLUCAP 108* 119* 104*   Metformin 250 start on 10/23  10/26: very well controlled 12. Hyperlipidemia.  Lipitor 13. Slow transit constipation. MiraLAX twice daily, Colace twice daily  Does not want to increase medications at present, will continue to monitor 14.  Mild hypoalbuminemia  Supplement initiated on 10/18 15.  Acute blood loss anemia  Hemoglobin 10.1 on 10/18, up to 10.5 on 10/25  Continue to monitor  16.  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  Sleep disturbance  Melatonin ordered on 10/20, increased on 10/22, increased again on 10/23, [place] on 10/24  ECG reviewed, QTc elevated, repeat ECG reviewed, improved, but elevated  Ambien 2.5 ordered on 10/24  Improved with Ambien   LOS: 10 days A FACE TO FACE EVALUATION WAS PERFORMED  Martha Clan P Oumar Marcott 10/09/2020, 1:56 PM

## 2020-10-09 NOTE — Progress Notes (Signed)
Physical Therapy Session Note  Patient Details  Name: Gregory [name] MRN: [id number] Date of Birth: 1961/05/04  Today's Date: 10/09/2020 PT Individual Time: 0800-0910 and 3295-1884 PT Individual Time Calculation (min): 70 min and 68 min  Short Term Goals: Week 1:  PT Short Term Goal 1 (Week 1): pt will transfer stand<>pivot with LRAD and CGA PT Short Term Goal 2 (Week 1): Pt will ambulate 78ft with LRAD CGA PT Short Term Goal 3 (Week 1): Pt will navigate 8 steps with CGA  Skilled Therapeutic Interventions/Progress Updates:   Treatment Session 1: 0800-0910 70 min Received pt supine in bed, pt agreeable to therapy, and denied any pain during session but reported soreness in L knee from yesterday's session. Session with emphasis on discharge planning, functional mobility/transfers, toileting, dressing, generalized strengthening, dynamic standing balance/coordination, ambulation, simulated car transfers, and improved activity tolerance. Pt performed bed mobility independently and donned non-skid socks independently. Pt ambulated 65ft to bathroom without AD and supervision. Pt able to manage clothing and hygiene independently; pt able to void and with BM. Pt sat in [facility] and brushed teeth and washed face independently. Ambulated around room without AD and supervision to gather clothing and sat EOB and donned ted hose, shirt, pants, and shoes independently. Pt ambulated >151ft to ortho gym mod I with SPC with 1 seated rest break. Pt performed ambulatory simulated car transfer with [facility] and supervision and ambulated 68ft on uneven surfaces (ramp and mulch) and navigated 1 curb with SPC and supervision. Pt required multiple rest breaks throughout session due to increased fatigue. Pt ambulated additional 147ft x1 and 115ft x 1 mod I with SPC throughout session. Engaged in [place] balance test with score of 43/56. Pt educated on test results and significance indicating moderate fall risk and importance of  using AD for safety; pt verbalized understanding. Concluded session with pt sitting in [facility] with needs within reach. Pt made mod I in room. Therapist provided fresh ice water for pt.   Treatment Session 2: 1660-6301 68 min Received pt supine in bed, pt agreeable to therapy, and denied any pain during session but reported feeling exhausted from previous PT session. Session with emphasis on functional mobility/transfers, toileting, generalized strengthening, dynamic standing balance/coordination, ambulation, and improved activity tolerance. Pt performed bed mobility independently and ambulated 74ft x 2 trials to/from bathroom mod I with SPC and able to toilet and manage clothing and hygiene independently. Pt with 1 LOB requiring CGA to correct when bending over to pick up phone from floor. Pt donned pants and shoes sitting EOB independently and ambulated 167ft x 2 trials mod I with SPC to/from dayroom and performed bilateral LE strengthening on Nustep at workload 6 for 10 minutes for a total of 380 steps for improved cardiovascular endurance. HR 11bpm after activity and pt required extensive rest/water break. Pt transferred sit<>stand without AD and UE support x 12 trials with supervision and worked on dynamic standing balance playing cornhole without AD and close supervision. Pt continues to require frequent rest breaks due to decreased activity tolerance, but overall improved significantly since eval. Worked on standing tolerance constructing pictures with pipelines. Pt able to stand approximately 1.5 minutes without UE support and supervision prior to needing to sit and rest. Concluded session with pt sitting in bed, needs within reach, and no alarm on. Pt mod I in room.   Therapy Documentation Precautions:  Precautions Precautions: Fall Restrictions Weight Bearing Restrictions: No   Therapy/Group: Individual Therapy Alfonse Alpers PT, DPT  10/09/2020, 7:34 AM

## 2020-10-09 NOTE — Progress Notes (Signed)
Physical Therapy Discharge Summary  Patient Details  Name: Gregory [name] MRN: [id number] Date of Birth: Sep 26, 1961  Patient has met 10 of 10 long term goals due to improved activity tolerance, improved balance, improved postural control, increased strength, improved awareness and improved coordination. Patient to discharge at an ambulatory level Modified Independent. Pt's family did not attend family education training, however pt to discharge home Mod I and verbalizes and demonstrates confidence with all tasks to ensure safe discharge home.   All goals met  Recommendation:  Patient will benefit from ongoing skilled PT services in outpatient setting to continue to advance safe functional mobility, address ongoing impairments in transfers, gait, dynamic standing balance/coordination, endurance, and to minimize fall risk.  Equipment: single point cane  Reasons for discharge: treatment goals met  Patient/family agrees with progress made and goals achieved: Yes  PT Discharge Precautions/Restrictions Precautions Precautions: Fall Restrictions Weight Bearing Restrictions: No Cognition Overall Cognitive Status: Within Functional Limits for tasks assessed Arousal/Alertness: Awake/alert Orientation Level: Oriented X4 Memory: Appears intact Awareness: Appears intact Problem Solving: Appears intact Safety/Judgment: Appears intact Sensation Sensation Light Touch: Appears Intact Proprioception: Appears Intact Coordination Gross Motor Movements are Fluid and Coordinated: No Fine Motor Movements are Fluid and Coordinated: Yes Coordination and Movement Description: mild uncoordination due to generalized weakness; improved since eval. Finger Nose Finger Test: [facility] bilaterally Heel Shin Test: [facility] bilaterally Motor  Motor Motor: Within Functional Limits Motor - Skilled Clinical Observations: mild uncoordination due to generalized weakness; improved since eval.  Mobility Bed  Mobility Bed Mobility: Rolling Right;Rolling Left;Supine to Sit;Sit to Supine Rolling Right: Independent Rolling Left: Independent Supine to Sit: Independent Sit to Supine: Independent Transfers Transfers: Sit to Stand;Stand to Sit;Stand Pivot Transfers Sit to Stand: Independent with assistive device Stand to Sit: Independent with assistive device Stand Pivot Transfers: Independent with assistive device Stand Pivot Transfer Details (indicate cue type and reason): no cues needed Transfer (Assistive device): Straight cane Locomotion  Gait Ambulation: Yes Gait Assistance: Independent with assistive device Gait Distance (Feet): 150 Feet Assistive device: Straight cane Gait Assistance Details: no cues needed Gait Gait: Yes Gait Pattern: Impaired Gait Pattern: Decreased trunk rotation;Decreased stride length;Poor foot clearance - left;Poor foot clearance - right;Step-through pattern Gait velocity: decreased Stairs / Additional Locomotion Stairs: Yes Stairs Assistance: Supervision/Verbal cueing Stair Management Technique: Two rails Number of Stairs: 20 Height of Stairs: 6 Ramp: Supervision/Verbal cueing (SPC) Curb: Supervision/Verbal cueing [facility]) Wheelchair Mobility Wheelchair Mobility: Yes Wheelchair Assistance: Independent with Camera operator: Both upper extremities Wheelchair Parts Management: Supervision/cueing Distance: >119f  Trunk/Postural Assessment  Cervical Assessment Cervical Assessment: Within Functional Limits Thoracic Assessment Thoracic Assessment: Within Functional Limits Lumbar Assessment Lumbar Assessment: Within Functional Limits Postural Control Postural Control: Within Functional Limits  Balance Balance Balance Assessed: Yes Standardized Balance Assessment Standardized Balance Assessment: Berg Balance Test Berg Balance Test Sit to Stand: Able to stand  independently using hands Standing Unsupported: Able to stand safely 2  minutes Sitting with Back Unsupported but Feet Supported on Floor or Stool: Able to sit safely and securely 2 minutes Stand to Sit: Sits safely with minimal use of hands Transfers: Able to transfer safely, definite need of hands Standing Unsupported with Eyes Closed: Able to stand 10 seconds with supervision Standing Ubsupported with Feet Together: Able to place feet together independently and stand for 1 minute with supervision From Standing, Reach Forward with Outstretched Arm: Can reach forward >12 cm safely (5") From Standing Position, Pick up Object from Floor: Able to pick up shoe,  needs supervision From Standing Position, Turn to Look Behind Over each Shoulder: Looks behind from both sides and weight shifts well Turn 360 Degrees: Able to turn 360 degrees safely but slowly Standing Unsupported, Alternately Place Feet on Step/Stool: Able to stand independently and safely and complete 8 steps in 20 seconds Standing Unsupported, One Foot in Front: Able to take small step independently and hold 30 seconds Standing on One Leg: Tries to lift leg/unable to hold 3 seconds but remains standing independently Total Score: 43 Static Sitting Balance Static Sitting - Balance Support: Feet supported;No upper extremity supported Static Sitting - Level of Assistance: 7: Independent Dynamic Sitting Balance Dynamic Sitting - Balance Support: Feet supported;No upper extremity supported Dynamic Sitting - Level of Assistance: 7: Independent Static Standing Balance Static Standing - Balance Support: Right upper extremity supported (SPC) Static Standing - Level of Assistance: 6: Modified independent (Device/Increase time) Dynamic Standing Balance Dynamic Standing - Balance Support: Right upper extremity supported (SPC) Dynamic Standing - Level of Assistance: 6: Modified independent (Device/Increase time) Extremity Assessment  RLE Assessment RLE Assessment: Exceptions to [facility] General Strength Comments:  grossly generalized to 4/5 LLE Assessment LLE Assessment: Exceptions to [facility] General Strength Comments: grossly generalized to 4/5   Alfonse Alpers PT, DPT  10/09/2020, 7:44 AM

## 2020-10-09 NOTE — Progress Notes (Signed)
Inpatient Rehabilitation Care Coordinator  Discharge Note  The overall goal for the admission was met for:   Discharge location: Yes-HOME WITH SON WHO CAN BE THERE DUE TO [place]  Length of Stay: Yes-11 DAYS  Discharge activity level: Yes-MOD/I-SUPERVISION LEVEL  Home/community participation: Yes  Services provided included: MD, RD, PT, OT, RN, CM, Pharmacy, Neuropsych and SW  Financial Services: Worker[employer]  Follow-up services arranged: Outpatient: [facility] WILL CALL TO SET UP APPOINTMENT, DME: [employer] ARRANGING FOR [facility]E FOR PATIENT and Patient/Family has no preference for HH/DME agencies  Comments (or additional information):PT DID WELL AND REACHED MOD/I-SUPERVISION LEVEL. [employer] HAD PRIOR AUTH FOR MEDICATIONS AND CANE ALONG WITH OPPT. PT SET UP WITH CARDIOLOGIST DOES NOT WANT TO GO TO [name] OR OTHER CLINIC DUE TO HIS UNINSURED STATUS. MEDICATIONS FAXED TO [name] [facility] ALONG WITH CANE AND OPPT.  Patient/Family verbalized understanding of follow-up arrangements: Yes  Individual responsible for coordination of the follow-up plan: SELF [phone] OR Alexie-SON [phone]  Confirmed correct DME delivered: Elease Hashimoto 10/09/2020    Elease Hashimoto

## 2020-10-09 NOTE — Discharge Summary (Addendum)
Physician Discharge Summary  Patient ID: Gregory [name] MRN: [id number] DOB/AGE: 12/22/1960 59 y.o.  Admit date: 09/29/2020 Discharge date: 10/10/2020  Discharge Diagnoses:  Principal Problem:   Debility Active Problems:   Acute blood loss anemia   Hypoalbuminemia due to protein-calorie malnutrition (HCC)   Diabetes mellitus, new onset (HCC)   Seizures (HCC)   Paroxysmal atrial fibrillation (HCC)   Slow transit constipation   Lipoma   Sleep disturbance   Labile blood glucose   Low magnesium level Diastolic congestive heart failure Hyperlipidemia  Discharged Condition: Stable  Significant Diagnostic Studies: DG Chest 1 View  Result Date: 09/11/2020 CLINICAL DATA:  Short of breath, CHF. EXAM: CHEST  1 VIEW COMPARISON:  09/09/2020 FINDINGS: Cardiac enlargement. AICD unchanged. Negative for heart failure or edema Progression of left lower lobe airspace disease with elevated left hemidiaphragm. Likely atelectasis or pneumonia. No effusion IMPRESSION: Interval development of mild left lower lobe airspace disease consistent with atelectasis or pneumonia. Electronically Signed   By: Franchot Gallo M.D.   On: 09/11/2020 11:52   DG Chest 2 View  Result Date: 09/14/2020 CLINICAL DATA:  Short of breath.  Progressive pleuritic pain EXAM: CHEST - 2 VIEW COMPARISON:  09/11/2020 FINDINGS: Progressive left lower lobe density due to airspace disease and effusion. Right lung remains clear. Cardiac enlargement without heart failure. AICD again noted. IMPRESSION: Progressive left lower lobe airspace disease and effusion. Possible pneumonia or fluid overload. Electronically Signed   By: Franchot Gallo M.D.   On: 09/14/2020 08:49   CT HEAD WO CONTRAST  Result Date: 09/16/2020 CLINICAL DATA:  Mental status change. EXAM: CT HEAD WITHOUT CONTRAST TECHNIQUE: Contiguous axial images were obtained from the base of the skull through the vertex without intravenous contrast. COMPARISON:  CT head  07/07/2017 FINDINGS: Brain: Subtle hypodensity and loss of gray-white differentiation involving the right insula and portions of the right frontal lobe. No evidence of acute hemorrhage, hydrocephalus, extra-axial collection or mass lesion/mass effect. Small hypodensity in the inferior left basal ganglia, similar to prior and likely a dilated perivascular space given location. Mild scattered white matter hypoattenuation, likely chronic microvascular ischemic change. Similar partially empty and expanded sella. Vascular: Calcific atherosclerosis. No hyperdense vessel identified. Skull: No acute fracture. Sinuses/Orbits: Mild ethmoid air cell mucosal thickening. Otherwise, the sinuses are clear. Other: No mastoid effusions. IMPRESSION: Subtle hypodensity and loss of gray-white differentiation involving the right insula and portions of the right frontal lobe. While this could relate to streak artifact in this region, acute MCA territory infarct is not excluded. MRI could further evaluate if there is clinical concern. These results will be called to the ordering clinician or representative by the Radiologist Assistant, and communication documented in the PACS or [employer]. Electronically Signed   By: Margaretha Sheffield MD   On: 09/16/2020 10:58   CARDIAC CATHETERIZATION  Result Date: 09/24/2020  Ost RCA to Mid RCA lesion is 100% stenosed.  Previously placed 1st Mrg stent (unknown type) is widely patent.  Prox Cx lesion is 100% stenosed.  Previously placed 1st Diag stent (unknown type) is widely patent.  Previously placed Prox LAD to Mid LAD stent (unknown type) is widely patent.  Legion Dereon Corkery is a 59 y.o. male  [id number] LOCATION:  FACILITY: [facility] PHYSICIAN: [name], M.D. [birth year] DATE OF PROCEDURE:  09/24/2020 DATE OF DISCHARGE: CARDIAC CATHETERIZATION History obtained from chart review. Gregory [name] is a 59 y.o. male with a hx of CAD (known chronic occl of RCA with prior  PCIs to  LAD, diag, and LCx)), CHF (HFmrEF with evaluation as low as 45%, but recently 60-65%) and VT s/p ICD who is being seen after shock at the request of Dr. Sabra Heck.  Had new atrial fibrillation.  Initially seen 09/09/20.  DCCV Twice through course.  Because of recurrent need for defibrillation for ventricular tachycardia it was elected to proceed with right left heart cath to rule out an ischemic etiology.  His cardiac enzymes were negative.   Mr. Riecke stents appear to be widely patent.  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  I suspect future caths should occur via the femoral approach given difficulty in cannulating the left main.  His right heart pressures suggest that he is euvolemic.  A Mynx closure device was used to create hemostasis in the right common femoral venous access site.  The sheath was removed and a radial band was placed on the right wrist to achieve hemostasis.  The patient left lab in stable condition. [name]. MD, [facility] 09/24/2020 3:34 PM   ECHOCARDIOGRAM COMPLETE  Result Date: 09/10/2020    ECHOCARDIOGRAM REPORT   Patient Name:   Gregory [name] Date of Exam: 09/10/2020 Medical Rec #:  [id number]                 Height:       78.0 in Accession #:    [id number]                Weight:       285.1 lb Date of Birth:  June 14, 1961                 BSA:          2.624 m Patient Age:    59 years                  BP:           106/74 mmHg Patient Gender: M                         HR:           97 bpm. Exam Location:  Inpatient Procedure: 2D Echo, Cardiac Doppler, Color Doppler and Intracardiac            Opacification Agent Indications:    Cardiomyopathy-Unspecified 425.9 / I42.9  History:        Patient has prior history of Echocardiogram examinations, most                 recent 06/08/2018. CHF and Cardiomyopathy, CAD; Risk                 Factors:Hypertension, Dyslipidemia, Sleep Apnea and Former                 Smoker.  Sonographer:    Vickie Epley  RDCS Referring Phys: [id number] [place]  Sonographer Comments: Technically difficult study due to poor echo windows and suboptimal subcostal window. IMPRESSIONS  1. Left ventricular ejection fraction, by estimation, is 55 to 60%. The left ventricle has normal function. The left ventricle has no regional wall motion abnormalities. Left ventricular diastolic function could not be evaluated.  2. Right ventricular systolic function is normal. The right ventricular size is normal.  3. Left atrial size was mildly dilated.  4. The mitral valve is normal in structure. Trivial mitral valve regurgitation. No evidence of mitral stenosis.  5. The aortic valve is  normal in structure. Aortic valve regurgitation is not visualized. Mild to moderate aortic valve sclerosis/calcification is present, without any evidence of aortic stenosis.  6. The inferior vena cava is normal in size with greater than 50% respiratory variability, suggesting right atrial pressure of 3 mmHg. FINDINGS  Left Ventricle: Left ventricular ejection fraction, by estimation, is 55 to 60%. The left ventricle has normal function. The left ventricle has no regional wall motion abnormalities. Definity contrast agent was given IV to delineate the left ventricular  endocardial borders. The left ventricular internal cavity size was normal in size. There is no left ventricular hypertrophy. Left ventricular diastolic function could not be evaluated due to atrial fibrillation. Left ventricular diastolic function could  not be evaluated.  LV Wall Scoring: Right Ventricle: The right ventricular size is normal. No increase in right ventricular wall thickness. Right ventricular systolic function is normal. Left Atrium: Left atrial size was mildly dilated. Right Atrium: Right atrial size was normal in size. Pericardium: There is no evidence of pericardial effusion. Mitral Valve: The mitral valve is normal in structure. Trivial mitral valve regurgitation. No  evidence of mitral valve stenosis. Tricuspid Valve: The tricuspid valve is normal in structure. Tricuspid valve regurgitation is mild . No evidence of tricuspid stenosis. Aortic Valve: The aortic valve is normal in structure. Aortic valve regurgitation is not visualized. Mild to moderate aortic valve sclerosis/calcification is present, without any evidence of aortic stenosis. Pulmonic Valve: The pulmonic valve was normal in structure. Pulmonic valve regurgitation is not visualized. No evidence of pulmonic stenosis. Aorta: The aortic root is normal in size and structure. Venous: The inferior vena cava is normal in size with greater than 50% respiratory variability, suggesting right atrial pressure of 3 mmHg. IAS/Shunts: No atrial level shunt detected by color flow Doppler. Additional Comments: A pacer wire is visualized in the right ventricle.  LEFT VENTRICLE PLAX 2D LVOT diam:     2.70 cm LV SV:         71 LV SV Index:   27 LVOT Area:     5.73 cm  LEFT ATRIUM             Index       RIGHT ATRIUM           Index LA Vol (A2C):   80.2 ml 30.56 ml/m RA Area:     19.30 cm LA Vol (A4C):   72.4 ml 27.59 ml/m RA Volume:   54.40 ml  20.73 ml/m LA Biplane Vol: 79.8 ml 30.41 ml/m  AORTIC VALVE LVOT Vmax:   84.90 cm/s LVOT Vmean:  56.700 cm/s LVOT VTI:    0.124 m  AORTA Ao Root diam: 4.20 cm  SHUNTS Systemic VTI:  0.12 m Systemic Diam: 2.70 cm Ena Dawley MD Electronically signed by Ena Dawley MD Signature Date/Time: 09/10/2020/2:52:13 PM    Final    ECHOCARDIOGRAM LIMITED  Result Date: 09/24/2020    ECHOCARDIOGRAM LIMITED REPORT   Patient Name:   Gregory [name] Date of Exam: 09/22/2020 Medical Rec #:  [id number]                 Height:       78.0 in Accession #:    [id number]                Weight:       302.0 lb Date of Birth:  05/13/1961                 BSA:  2.690 m Patient Age:    19 years                  BP:           102/70 mmHg Patient Gender: M                         HR:            82 bpm. Exam Location:  Inpatient Procedure: Limited Color Doppler, Cardiac Doppler and Limited Echo                                 MODIFIED REPORT:     This report was modified by Rudean Haskell MD on 09/24/2020 due to                               Re-Reviewed Images.  Indications:     ventricular tachycardia  History:         Patient has prior history of Echocardiogram examinations, most                  recent 09/10/2020. CHF, CAD, Defibrillator, Arrythmias:Atrial                  Fibrillation; Risk Factors:Hypertension.  Sonographer:     Johny Chess Referring Phys:  [id number] [place] Gasper Sells Diagnosing Phys: Rudean Haskell MD IMPRESSIONS  1. Left ventricular ejection fraction, by estimation, is 40 to 45%. The left ventricle has mildly decreased function. The left ventricle demonstrates regional wall motion abnormalities (see scoring diagram/findings for description). Left ventricular diastolic parameters are consistent with Grade I diastolic dysfunction (impaired relaxation).  2. Right ventricular systolic function was not well visualized. The right ventricular size is normal.  3. The mitral valve is grossly normal. No evidence of mitral valve regurgitation.  4. The aortic valve was not well visualized.  5. The inferior vena cava is normal in size with greater than 50% respiratory variability, suggesting right atrial pressure of 3 mmHg. Comparison(s): Slight decrease in LV function with different caliber (non-contrasted) study. Slight decrease in global LVEF. FINDINGS  Left Ventricle: Left ventricular ejection fraction, by estimation, is 40 to 45%. The left ventricle has mildly decreased function. The left ventricle demonstrates regional wall motion abnormalities. Left ventricular diastolic parameters are consistent with Grade I diastolic dysfunction (impaired relaxation).  LV Wall Scoring: The antero-lateral wall and apical lateral segment are hypokinetic. Right Ventricle: The right  ventricular size is normal. Right ventricular systolic function was not well visualized. Pericardium: There is no evidence of pericardial effusion. Mitral Valve: The mitral valve is grossly normal. Tricuspid Valve: The tricuspid valve is not well visualized. Tricuspid valve regurgitation is trivial. Aortic Valve: The aortic valve was not well visualized. Pulmonic Valve: The pulmonic valve was not well visualized. Pulmonic valve regurgitation is not visualized. Aorta: The aortic root and ascending aorta are structurally normal, with no evidence of dilitation. Venous: The inferior vena cava is normal in size with greater than 50% respiratory variability, suggesting right atrial pressure of 3 mmHg. LEFT VENTRICLE PLAX 2D LVIDd:         5.60 cm  Diastology LVIDs:         4.70 cm  LV e' medial:    7.40 cm/s LV PW:         1.10 cm  LV E/e' medial:  7.0 LV IVS:        1.30 cm  LV e' lateral:   11.10 cm/s LVOT diam:     2.30 cm  LV E/e' lateral: 4.7 LV SV:         62 LV SV Index:   23 LVOT Area:     4.15 cm  AORTIC VALVE LVOT Vmax:   81.60 cm/s LVOT Vmean:  62.300 cm/s LVOT VTI:    0.149 m  AORTA Ao Root diam: 4.40 cm Ao Asc diam:  3.60 cm MITRAL VALVE MV Area (PHT): 3.03 cm    SHUNTS MV Decel Time: 250 msec    Systemic VTI:  0.15 m MV E velocity: 51.80 cm/s  Systemic Diam: 2.30 cm MV A velocity: 60.00 cm/s MV E/A ratio:  0.86 Rudean Haskell MD Electronically signed by Rudean Haskell MD Signature Date/Time: 09/23/2020/7:33:51 AM    Final (Updated)    US Abdomen Limited RUQ  Result Date: 09/11/2020 CLINICAL DATA:  Cirrhosis. EXAM: ULTRASOUND ABDOMEN LIMITED RIGHT UPPER QUADRANT COMPARISON:  None. FINDINGS: Gallbladder: A 1.0 cm shadowing echogenic gallstone is seen within the gallbladder lumen. There is no evidence of gallbladder wall thickening (2.0 mm). No sonographic Murphy sign noted by sonographer. Common bile duct: Diameter: 3.3 mm Liver: No focal lesion identified. There is diffusely increased  echogenicity of the liver parenchyma. Portal vein is patent on color Doppler imaging with normal direction of blood flow towards the liver. Other: None. IMPRESSION: 1. Cholelithiasis, without evidence of acute cholecystitis. 2. Fatty liver. Electronically Signed   By: Virgina Norfolk M.D.   On: 09/11/2020 16:19    Labs:  Basic Metabolic Panel: No results for input(s): NA, K, CL, CO2, GLUCOSE, BUN, CREATININE, CALCIUM, MG, PHOS in the last 168 hours.  CBC: Recent Labs  Lab 10/08/20 0701  WBC 7.9  NEUTROABS 5.6  HGB 10.5*  HCT 32.6*  MCV 102.2*  PLT 308    CBG: Recent Labs  Lab 10/08/20 1638 10/08/20 2116 10/09/20 0609 10/09/20 1207 10/09/20 1649  GLUCAP 90 108* 119* 104* 114*   Family history.  Mother with CAD Father with brain cancer daughter with hypertension.  Denies any diabetes mellitus colon cancer or esophageal cancer  Brief HPI:   Janie Ridhaan Dreibelbis is a 59 y.o. right-handed male with history of hyperlipidemia seizure disorder CAD diastolic congestive heart failure maintained on Entresto, ischemic cardiomyopathy, VT status post AICD maintained on Plavix and quit smoking 7 years ago.  Per chart review lives with son who works from home.  Multilevel home.  Reportedly independent prior to admission and active.  Presented 09/09/2020 with generalized weakness and dizziness in the setting of multiple days of diarrhea.  Denied any vomiting or abdominal pain.  Ultrasound the abdomen showed cholelithiasis without evidence of acute cholecystitis and fatty liver.  Chest x-ray negative.  Admission chemistry sodium 130 potassium 3.1 chloride 95 glucose 189 BUN 55 creatinine 5.60 calcium 5.2 AST 88 lactic acid 3.2 troponin 48 magnesium 1.6 urine culture no growth.  Echocardiogram with ejection fraction of 55 to 60% no wall motion abnormalities.  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  Cardiac catheterization completed 09/24/2020 per Dr. Alvester Chou for follow-up showed no obstructive disease.  Patient did experience some altered mental  status cranial CT scan completed which was reviewed by neurology no acute process noted.  He was empirically placed on IV Rocephin for question UTI latest cultures negative.  Follow-up cardiology services for acute on chronic diastolic congestive heart failure patient had undergone diuresis.  Persistent elevations in glucose 186-237 hemoglobin A1c 6.9 maintained on sliding scale insulin low-dose Levemir diabetic diet.  Keppra ongoing for seizure disorder.  AKI likely prerenal in the setting of A. fib with RVR placed on gentle IV fluids latest creatinine 0.86.  Therapy evaluations completed and patient was admitted for a comprehensive rehab program   Hospital Course: [facility] [place] was admitted to rehab 09/29/2020 for inpatient therapies to consist of PT, ST and OT at least three hours five days a week. Past admission physiatrist, therapy team and rehab RN have worked together to provide customized collaborative inpatient rehab.  Pertaining to patient's debility related to atrial fibrillation RVR multimedical remained stable patient participating with therapies.  Eliquis ongoing no bleeding episodes as well as Plavix.  Diastolic congestive heart failure Lasix as advised Coreg ongoing as well as Entresto follow-up cardiology services patient exhibited no signs of fluid overload.  Cardiac rate remained controlled noted A. fib RVR status post DCCV 09/19/2020 amiodarone as advised.  Blood sugars much improved hemoglobin A1c 6.9 patient initially on Levemir transition to Metformin and would need follow-up outpatient primary MD.  Lipitor for  hyperlipidemia.  Keppra ongoing for history of seizure disorder no seizure activity noted.   Blood pressures were monitored on TID basis and controlled  Diabetes has been monitored with ac/hs CBG checks and SSI was use prn for tighter BS control.    Rehab course: During patient's stay in rehab weekly team conferences were held to monitor patient's progress, set goals and discuss barriers to discharge. At admission, patient required moderate assist stand pivot transfers moderate assist supine to sit.  Set up upper body bathing mod assist lower body bathing set up upper body dressing moderate assist lower body dressing  Physical exam.  Blood pressure 113/74 pulse 85 temp of 97 5 respirations 18 oxygen saturation 93% room air Constitutional.  No acute distress HEENT Head.  Normocephalic and atraumatic Eyes.  Pupils round and reactive to light no discharge.nystagmus Neck.  Supple nontender no JVD without thyromegaly Cardiac regular rate and rhythm without murmur Respiratory effort normal no respiratory distress without wheeze Abdomen.  Soft nontender positive bowel sounds without rebound Extremities no clubbing cyanosis or edema Skin.  No evidence of breakdown or rash Neurologic.  Cranial nerves II through XII intact motor strength 4/5 bilateral deltoids biceps triceps grip 3 - bilateral hip flexors knee extensors ankle dorsi flexion and plantar flexion sensation intact  He/  has had improvement in activity tolerance, balance, postural control as well as ability to compensate for deficits. He/ has had improvement in functional use RUE/LUE  and RLE/LLE as well as improvement in awareness.  Patient discharged ambulatory level modified independent.  Independent sit to stand independent stand to sit independent stand pivot transfers.  Ambulates 150 feet straight cane.  Modified independent ADLs.  Full family teaching completed plan discharge to home       Disposition:  Discharge to  home   Diet: Carb modified  Special Instructions: No driving smoking or alcohol  Medications at discharge 1.  Tylenol as needed 2.  Amiodarone 400 mg daily until 10/13/2020 then begin 200 mg daily 3.  Eliquis 5 mg p.o. twice daily 4.  Lipitor 80 mg p.o. nightly  5.  Coreg 80 mg p.o. daily 6.  Plavix 75 mg p.o. daily 7.  Colace 100 mg p.o. twice daily 8.  Lasix 40 mg p.o. twice daily 9.  Keppra 500 mg p.o. twice daily 10.  Magnesium oxide 200 mg p.o. daily 11.  Melatonin 5 mg p.o. nightly 12.  Glucophage 250 mg p.o. daily 13.  Oxycodone 5 mg every 6 as needed moderate pain 14.  Protonix 40 mg p.o. daily 15.  MiraLAX daily hold for loose stools 16.  Klor-Con 10 mEq p.o. daily 17.  Entresto 97-103 mg p.o. twice daily 18.  Vitamin A&E ointment applied to back daily as needed dry skin  30-35 minutes were spent completing discharge summary and discharge planning  Discharge Instructions     Ambulatory referral to Physical Medicine Rehab   Complete by: As directed    Moderate complexity follow up one month DEBILITY/MULTI MEDICAL        Follow-up Information     Jamse Arn, MD Follow up.   Specialty: Physical Medicine and Rehabilitation Why: Office to call for appointment Contact information: [place] [address] [phone]         Sanda Klein, MD Follow up.   Specialty: Cardiology Why: Call for appointment Contact information: [address] [place] [address] [phone]                 Signed: Cathlyn Parsons 10/09/2020, 5:31 PM Patient was seen, face-face, and physical exam performed by me on day of discharge, greater than 30 minutes of total time spent.. Please see progress note from day of discharge as well.  Delice Lesch, MD, ABPMR

## 2020-10-10 LAB — GLUCOSE, CAPILLARY
Glucose-Capillary: 126 mg/dL — ABNORMAL HIGH (ref 70–99)
Glucose-Capillary: 81 mg/dL (ref 70–99)

## 2020-10-10 MED ORDER — HYDROXYZINE HCL 10 MG PO TABS: 10.0000 mg | ORAL_TABLET | Freq: Three times a day (TID) | ORAL | 0 refills | Status: DC | PRN

## 2020-10-10 MED ORDER — ACETAMINOPHEN 325 MG PO TABS: 650.0000 mg | ORAL_TABLET | ORAL | Status: DC | PRN

## 2020-10-10 MED ORDER — AMIODARONE HCL 400 MG PO TABS: 400.0000 mg | ORAL_TABLET | Freq: Every day | ORAL | 0 refills | Status: DC

## 2020-10-10 MED ORDER — TRAZODONE HCL 100 MG PO TABS: 100.0000 mg | ORAL_TABLET | Freq: Every evening | ORAL | 0 refills | Status: DC | PRN

## 2020-10-10 MED ORDER — ZOLPIDEM TARTRATE 5 MG PO TABS: 2.5000 mg | ORAL_TABLET | Freq: Every day | ORAL | 0 refills | Status: DC

## 2020-10-10 MED ORDER — LEVETIRACETAM 500 MG PO TABS: ORAL_TABLET | ORAL | 0 refills | Status: DC

## 2020-10-10 MED ORDER — MAGNESIUM OXIDE 400 (241.3 MG) MG PO TABS: 200.0000 mg | ORAL_TABLET | Freq: Every day | ORAL | 0 refills | Status: DC

## 2020-10-10 MED ORDER — POTASSIUM CHLORIDE CRYS ER 10 MEQ PO TBCR: 10.0000 meq | EXTENDED_RELEASE_TABLET | Freq: Every day | ORAL | 0 refills | Status: DC

## 2020-10-10 MED ORDER — MELATONIN 5 MG PO TABS: 5.0000 mg | ORAL_TABLET | Freq: Every day | ORAL | 0 refills | Status: DC

## 2020-10-10 MED ORDER — FUROSEMIDE 40 MG PO TABS: 40.0000 mg | ORAL_TABLET | Freq: Two times a day (BID) | ORAL | 0 refills | Status: DC

## 2020-10-10 MED ORDER — APIXABAN 5 MG PO TABS: 5.0000 mg | ORAL_TABLET | Freq: Two times a day (BID) | ORAL | 0 refills | Status: DC

## 2020-10-10 MED ORDER — HYDROCORTISONE 1 % EX CREA: 1.0000 "application " | TOPICAL_CREAM | Freq: Three times a day (TID) | CUTANEOUS | 0 refills | Status: DC | PRN

## 2020-10-10 MED ORDER — OXYCODONE HCL 5 MG PO TABS
5.0000 mg | ORAL_TABLET | Freq: Two times a day (BID) | ORAL | 0 refills | Status: DC | PRN
Start: 2020-10-10 — End: 2022-04-14

## 2020-10-10 MED ORDER — VITAMINS A & D EX OINT: TOPICAL_OINTMENT | CUTANEOUS | 0 refills | Status: DC | PRN

## 2020-10-10 MED ORDER — POLYETHYLENE GLYCOL 3350 17 G PO PACK: 17.0000 g | PACK | Freq: Every day | ORAL | 0 refills | Status: DC | PRN

## 2020-10-10 MED ORDER — AMIODARONE HCL 200 MG PO TABS: 200.0000 mg | ORAL_TABLET | Freq: Every day | ORAL | 0 refills | Status: DC

## 2020-10-10 MED ORDER — METFORMIN HCL 500 MG PO TABS: 250.0000 mg | ORAL_TABLET | Freq: Every day | ORAL | 0 refills | Status: DC

## 2020-10-10 MED ORDER — ATORVASTATIN CALCIUM 80 MG PO TABS: 80.0000 mg | ORAL_TABLET | Freq: Every evening | ORAL | 0 refills | Status: DC

## 2020-10-10 NOTE — Progress Notes (Signed)
Patient discharged to home with daughter. Patient placed in car. Daughter packed all belongings and nothing was left.

## 2020-10-10 NOTE — Plan of Care (Signed)
°  Problem: Consults Goal: RH GENERAL PATIENT EDUCATION Description: See Patient Education module for education specifics. Outcome: Progressing Goal: Skin Care Protocol Initiated - if Braden Score 18 or less Description: If consults are not indicated, leave blank or document N/A Outcome: Progressing Goal: Nutrition Consult-if indicated Outcome: Progressing Goal: Diabetes Guidelines if Diabetic/Glucose > 140 Description: If diabetic or lab glucose is > 140 mg/dl - Initiate Diabetes/Hyperglycemia Guidelines & Document Interventions  Outcome: Progressing   Problem: RH BOWEL ELIMINATION Goal: RH STG MANAGE BOWEL W/MEDICATION W/ASSISTANCE Description: STG Manage Bowel with Medication with Assistance. Outcome: Progressing   Problem: RH BLADDER ELIMINATION Goal: RH STG MANAGE BLADDER WITH ASSISTANCE Description: STG Manage Bladder With Assistance Outcome: Progressing   Problem: RH SKIN INTEGRITY Goal: RH STG SKIN FREE OF INFECTION/BREAKDOWN Outcome: Progressing Goal: RH STG MAINTAIN SKIN INTEGRITY WITH ASSISTANCE Description: STG Maintain Skin Integrity With Assistance. Outcome: Progressing Goal: RH STG ABLE TO PERFORM INCISION/WOUND CARE W/ASSISTANCE Description: STG Able To Perform Incision/Wound Care With Assistance. Outcome: Progressing   Problem: RH SAFETY Goal: RH STG ADHERE TO SAFETY PRECAUTIONS W/ASSISTANCE/DEVICE Description: STG Adhere to Safety Precautions With Assistance/Device. Outcome: Progressing Goal: RH STG DECREASED RISK OF FALL WITH ASSISTANCE Description: STG Decreased Risk of Fall With Assistance. Outcome: Progressing   Problem: RH PAIN MANAGEMENT Goal: RH STG PAIN MANAGED AT OR BELOW PT'S PAIN GOAL Outcome: Progressing

## 2020-10-10 NOTE — Progress Notes (Signed)
[facility] PHYSICAL MEDICINE & REHABILITATION PROGRESS NOTE   Subjective/Complaints: Patient seen laying in bed this morning.  He states he slept well overnight.  He is ready for discharge, but again has questions regarding discharge medications.  Review of systems: Denies CP, SOB, N/V/D  Objective:   No results found. Recent Labs    10/08/20 0701  WBC 7.9  HGB 10.5*  HCT 32.6*  PLT 308   No results for input(s): NA, K, CL, CO2, GLUCOSE, BUN, CREATININE, CALCIUM in the last 72 hours.  Intake/Output Summary (Last 24 hours) at 10/10/2020 0914 Last data filed at 10/10/2020 0453 Gross per 24 hour  Intake --  Output 1750 ml  Net -1750 ml        Physical Exam: Vital Signs Blood pressure 127/89, pulse 67, temperature 97.9 F (36.6 C), resp. rate 15, height 6\' 6"  (1.981 m), weight 126.1 kg, SpO2 96 %.  Constitutional: No distress . Vital signs reviewed. HENT: Normocephalic.  Atraumatic. Eyes: EOMI. No discharge.  Left eye lipoma. Cardiovascular: No JVD.  RRR. Respiratory: Normal effort.  No stridor.  Bilateral clear to auscultation. GI: Non-distended.  BS +. Skin: Warm and dry.  Intact. Psych: Normal mood.  Normal behavior. Musc: No edema in extremities.  No tenderness in extremities. Neuro: Alert Motor: Bilateral upper extremities: 5/5 proximal distal Bilateral lower extremities: In flexion, knee extension 4+/5, ankle dorsiflexion 5/5, unchanged  Assessment/Plan: 1. Functional deficits secondary to debility  which require 3+ hours per day of interdisciplinary therapy in a comprehensive inpatient rehab setting.  Physiatrist is providing close team supervision and 24 hour management of active medical problems listed below.  Physiatrist and rehab team continue to assess barriers to discharge/monitor patient progress toward functional and medical goals  Care Tool:  Bathing    Body parts bathed by patient: Right arm, Left arm, Chest, Abdomen, Front perineal area, Right  upper leg, Left upper leg, Face, Buttocks, Right lower leg, Left lower leg   Body parts bathed by helper: Left lower leg     Bathing assist Assist Level: Independent with assistive device     Upper Body Dressing/Undressing Upper body dressing   What is the patient wearing?: Pull over shirt    Upper body assist Assist Level: Independent    Lower Body Dressing/Undressing Lower body dressing      What is the patient wearing?: Underwear/pull up, Pants     Lower body assist Assist for lower body dressing: Independent     Toileting Toileting    Toileting assist Assist for toileting: Independent     Transfers Chair/bed transfer  Transfers assist     Chair/bed transfer assist level: Independent with assistive device Chair/bed transfer assistive device: Research officer, political party   Ambulation assist      Assist level: Independent with assistive device Assistive device: Cane-straight Max distance: >139ft   Walk 10 feet activity   Assist     Assist level: Independent with assistive device Assistive device: Cane-straight   Walk 50 feet activity   Assist Walk 50 feet with 2 turns activity did not occur: Safety/medical concerns (fatigue, LE weakness, decreased balance/postural control)  Assist level: Independent with assistive device Assistive device: Cane-straight    Walk 150 feet activity   Assist Walk 150 feet activity did not occur: Safety/medical concerns (fatigue, LE weakness, decreased balance/postural control)  Assist level: Independent with assistive device Assistive device: Cane-straight    Walk 10 feet on uneven surface  activity   Assist Walk 10 feet  on uneven surfaces activity did not occur: Safety/medical concerns (fatigue, LE weakness, decreased balance/postural control)   Assist level: Supervision/Verbal cueing Assistive device: Government social research officer Will patient use wheelchair at discharge?: No Type of  Wheelchair: Manual    Wheelchair assist level: Independent Max wheelchair distance: >146ft    Wheelchair 50 feet with 2 turns activity    Assist        Assist Level: Independent   Wheelchair 150 feet activity     Assist      Assist Level: Independent    Medical Problem List and Plan: 1.Debilitysecondary torenal failure and  atrial fibrillation with RVR Acute encephalopathy/sepsis/multimedical.  DC today  Will see patient for hospital follow-up in 1 month post-discharge 2. Antithrombotics: -DVT/anticoagulation:Eliquis -antiplatelet therapy: Plavix 75 mg daily 3. Pain Management:Tramadol as needed 4. Mood:Provide emotional support -antipsychotic agents: N/A 5. Neuropsych: This patientiscapable of making decisions on hisown behalf. 6. Skin/Wound Care:Routine skin checks  A&D ointment ordered for pruritus/dry skin-improved 7. Fluids/Electrolytes/Nutrition:Routine in and outs 8. Diastolic congestive heart failure. ContinueLasix 40 mg twice daily, Coreg CR80 mg daily, Entresto 97-103 mg twice daily. Follow-up cardiology service. Monitor for any signs of fluid overload  Daily weights ordered Filed Weights   10/08/20 0500 10/09/20 0500 10/10/20 0500  Weight: 127.3 kg 125.7 kg 126.1 kg   Stable on 10/27 9. A. fib with RVR. Status post DCCV 09/19/2020. Continue amiodarone as well as carvedilol controlled release to 80 mg/day Vitals:   10/09/20 1940 10/10/20 0435  BP: 101/62 127/89  Pulse: 72 67  Resp: 15 15  Temp: 98 F (36.7 C) 97.9 F (36.6 C)  SpO2: 99% 96%   Rate controlled 10/27 10. History of seizure disorder. Keppra 500 mg twice daily  No breakthrough seizures from admission to rehab-10/27 11. New onset diabetes mellitus. Hemoglobin A1c 6.9 on 10/8.   Levemir 5 units twice daily DC'd on 10/19.   Diabetic teaching CBG (last 3)  Recent Labs    10/09/20 1649 10/09/20 2116 10/10/20 0610  GLUCAP 114*  147* 126*   Metformin 250 start on 10/23  Mildly elevated on 10/27, continue to monitor amatory setting for further adjustments as necessary 12. Hyperlipidemia.  Lipitor 13. Slow transit constipation. MiraLAX twice daily, Colace twice daily  Does not want to increase medications at present, will continue to monitor 14.  Mild hypoalbuminemia  Supplement initiated on 10/18 15.  Acute blood loss anemia  Hemoglobin 10.5 on 10/25  Continue to monitor  16.  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  Sleep disturbance  Melatonin ordered on 10/20, increased on 10/22, increased again on 10/23, [place] on 10/24  ECG reviewed, QTc elevated, repeat ECG reviewed, improved, but elevated  Ambien 2.5 ordered on 10/24  Improved  > 30 minutes spent in total in discharge planning between myself and PA regarding aforementioned, as well discussion regarding DME equipment, follow-up appointments, follow-up therapies, discharge medications, discharge recommendations   LOS: 11 days A FACE TO FACE EVALUATION WAS PERFORMED  Gregory [name] Gregory [name] 10/10/2020, 9:14 AM

## 2020-10-10 NOTE — Plan of Care (Signed)
Problem: Consults Goal: RH GENERAL PATIENT EDUCATION Description: See Patient Education module for education specifics. 10/10/2020 1845 by Renda Rolls L, LPN Outcome: Completed/Met 10/10/2020 1845 by Sanda Linger, LPN Outcome: Progressing Goal: Skin Care Protocol Initiated - if Braden Score 18 or less Description: If consults are not indicated, leave blank or document N/A 10/10/2020 1845 by Renda Rolls L, LPN Outcome: Completed/Met 10/10/2020 1845 by Renda Rolls L, LPN Outcome: Progressing Goal: Nutrition Consult-if indicated 10/10/2020 1845 by Renda Rolls L, LPN Outcome: Completed/Met 10/10/2020 1845 by Renda Rolls L, LPN Outcome: Progressing Goal: Diabetes Guidelines if Diabetic/Glucose > 140 Description: If diabetic or lab glucose is > 140 mg/dl - Initiate Diabetes/Hyperglycemia Guidelines & Document Interventions  10/10/2020 1845 by Renda Rolls L, LPN Outcome: Completed/Met 10/10/2020 1845 by Sanda Linger, LPN Outcome: Progressing   Problem: RH BOWEL ELIMINATION Goal: RH STG MANAGE BOWEL W/MEDICATION W/ASSISTANCE Description: STG Manage Bowel with Medication with Assistance. 10/10/2020 1845 by Renda Rolls L, LPN Outcome: Completed/Met 10/10/2020 1845 by Renda Rolls L, LPN Outcome: Progressing   Problem: RH BLADDER ELIMINATION Goal: RH STG MANAGE BLADDER WITH ASSISTANCE Description: STG Manage Bladder With Assistance 10/10/2020 1845 by Renda Rolls L, LPN Outcome: Completed/Met 10/10/2020 1845 by Renda Rolls L, LPN Outcome: Progressing   Problem: RH SKIN INTEGRITY Goal: RH STG SKIN FREE OF INFECTION/BREAKDOWN 10/10/2020 1845 by Renda Rolls L, LPN Outcome: Completed/Met 10/10/2020 1845 by Renda Rolls L, LPN Outcome: Progressing Goal: RH STG MAINTAIN SKIN INTEGRITY WITH ASSISTANCE Description: STG Maintain Skin Integrity With Assistance. 10/10/2020 1845 by Renda Rolls L, LPN Outcome: Completed/Met 10/10/2020 1845 by Sanda Linger, LPN Outcome:  Progressing Goal: RH STG ABLE TO PERFORM INCISION/WOUND CARE W/ASSISTANCE Description: STG Able To Perform Incision/Wound Care With Assistance. 10/10/2020 1845 by Renda Rolls L, LPN Outcome: Completed/Met 10/10/2020 1845 by Sanda Linger, LPN Outcome: Progressing   Problem: RH SAFETY Goal: RH STG ADHERE TO SAFETY PRECAUTIONS W/ASSISTANCE/DEVICE Description: STG Adhere to Safety Precautions With Assistance/Device. 10/10/2020 1845 by Renda Rolls L, LPN Outcome: Completed/Met 10/10/2020 1845 by Sanda Linger, LPN Outcome: Progressing Goal: RH STG DECREASED RISK OF FALL WITH ASSISTANCE Description: STG Decreased Risk of Fall With Assistance. 10/10/2020 1845 by Renda Rolls L, LPN Outcome: Completed/Met 10/10/2020 1845 by Renda Rolls L, LPN Outcome: Progressing   Problem: RH PAIN MANAGEMENT Goal: RH STG PAIN MANAGED AT OR BELOW PT'S PAIN GOAL 10/10/2020 1845 by Renda Rolls L, LPN Outcome: Completed/Met 10/10/2020 1845 by Sanda Linger, LPN Outcome: Progressing

## 2020-10-11 ENCOUNTER — Encounter: Payer: Self-pay | Admitting: Student

## 2020-10-11 ENCOUNTER — Telehealth: Payer: Self-pay

## 2020-10-11 NOTE — Telephone Encounter (Signed)
Yesterday, [name] was not sent to the pharmacy at discharge. Please send the script to the pharmacy.   Thank you.

## 2020-10-16 ENCOUNTER — Telehealth: Payer: Self-pay

## 2020-10-16 NOTE — Telephone Encounter (Signed)
Dr Royce Macadamia or Rico Junker (could not understand) called [phone]-- they would like peer to peer on medications being prescribed - if you could call back please

## 2020-10-17 NOTE — Telephone Encounter (Signed)
I called the number back.  I was told that the peer-to-peer was no longer needed as yesterday was the last day.  A decision has/will be made and a notification will be sent to our office.  Of note, I have already received notification regarding Eliquis, its review, and its approval.  Thanks.

## 2020-10-18 ENCOUNTER — Ambulatory Visit (INDEPENDENT_AMBULATORY_CARE_PROVIDER_SITE_OTHER): Payer: Worker's Compensation

## 2020-10-18 ENCOUNTER — Telehealth: Payer: Self-pay

## 2020-10-18 DIAGNOSIS — I255 Ischemic cardiomyopathy: Secondary | ICD-10-CM | POA: Diagnosis not present

## 2020-10-18 NOTE — Telephone Encounter (Signed)
Patient called in about letter he received that he missed a transmission and I helped him send that transmission and it was successful and changed his transmission dates

## 2020-10-19 ENCOUNTER — Telehealth: Payer: Self-pay | Admitting: Emergency Medicine

## 2020-10-19 LAB — CUP PACEART REMOTE DEVICE CHECK
Battery Remaining Longevity: 66 mo
Battery Voltage: 2.99 V
Brady Statistic AP VP Percent: 0.01 %
Brady Statistic AP VS Percent: 3.88 %
Brady Statistic AS VP Percent: 0.04 %
Brady Statistic AS VS Percent: 96.08 %
Brady Statistic RA Percent Paced: 3.88 %
Brady Statistic RV Percent Paced: 0.04 %
Date Time Interrogation Session: 20211104145323
HighPow Impedance: 43 Ohm
HighPow Impedance: 56 Ohm
Implantable Lead Implant Date: 20051120
Implantable Lead Implant Date: 20101117
Implantable Lead Location: 753859
Implantable Lead Location: 753860
Implantable Lead Model: 5076
Implantable Lead Model: 7121
Implantable Pulse Generator Implant Date: 20180105
Lead Channel Impedance Value: 304 Ohm
Lead Channel Impedance Value: 418 Ohm
Lead Channel Impedance Value: 418 Ohm
Lead Channel Pacing Threshold Amplitude: 0.625 V
Lead Channel Pacing Threshold Amplitude: 1 V
Lead Channel Pacing Threshold Pulse Width: 0.4 ms
Lead Channel Pacing Threshold Pulse Width: 0.4 ms
Lead Channel Sensing Intrinsic Amplitude: 14.875 mV
Lead Channel Sensing Intrinsic Amplitude: 14.875 mV
Lead Channel Sensing Intrinsic Amplitude: 4.125 mV
Lead Channel Sensing Intrinsic Amplitude: 4.125 mV
Lead Channel Setting Pacing Amplitude: 1.5 V
Lead Channel Setting Pacing Amplitude: 2 V
Lead Channel Setting Pacing Pulse Width: 0.4 ms
Lead Channel Setting Sensing Sensitivity: 0.6 mV

## 2020-10-19 NOTE — Telephone Encounter (Signed)
Scheduled transmission received that showed increased optivol and decreased thoracic impedence. Patient reports no new s/sx. Patient reports he has had no SOB, CP, or chest pressure. He reports BLE edema that is "slightly" increased over the past 2 weeks. He sleeps on 1 pillow at night. He is taking Lasix 40 mg BID. Patient has f/u with [place] PA on 11/15/20. Education on decreasing sodium intake. ED precautions given.

## 2020-10-22 NOTE — Progress Notes (Signed)
Remote ICD transmission.   

## 2020-11-01 ENCOUNTER — Encounter: Payer: Self-pay | Admitting: Physical Medicine & Rehabilitation

## 2020-11-01 ENCOUNTER — Other Ambulatory Visit: Payer: Self-pay

## 2020-11-01 ENCOUNTER — Encounter
Payer: Worker's Compensation | Attending: Physical Medicine & Rehabilitation | Admitting: Physical Medicine & Rehabilitation

## 2020-11-01 VITALS — BP 134/91 | HR 79 | Temp 97.7°F | Ht 78.0 in | Wt 261.8 lb

## 2020-11-01 DIAGNOSIS — R569 Unspecified convulsions: Secondary | ICD-10-CM | POA: Diagnosis present

## 2020-11-01 DIAGNOSIS — R5381 Other malaise: Secondary | ICD-10-CM | POA: Diagnosis not present

## 2020-11-01 DIAGNOSIS — G40909 Epilepsy, unspecified, not intractable, without status epilepticus: Secondary | ICD-10-CM

## 2020-11-01 DIAGNOSIS — D1779 Benign lipomatous neoplasm of other sites: Secondary | ICD-10-CM | POA: Diagnosis present

## 2020-11-01 DIAGNOSIS — E119 Type 2 diabetes mellitus without complications: Secondary | ICD-10-CM

## 2020-11-01 DIAGNOSIS — I5042 Chronic combined systolic (congestive) and diastolic (congestive) heart failure: Secondary | ICD-10-CM | POA: Diagnosis present

## 2020-11-01 NOTE — Progress Notes (Signed)
Subjective:    Patient ID: Gregory [name], male    DOB: 1961/11/14, 59 y.o.   MRN: [id number]  HPI Right-handed male with history of hyperlipidemia, seizure disorder, CAD, diastolic congestive heart failure, ischemic cardiomyopathy, VT status post AICD presents for hospital follow up for debility.  Admit date: 09/29/2020 Discharge date: 10/10/2020  Several communications exchanged with workman's comp regarding medications. At discharge, patient was instructed to follow up with Cards, whom he sees in 2 weeks. Pruritis is improving. BP/HR controlled. Denies seizures.  Had 1 fall, first day at home when leg "gave out".  CBGs ~90-130. Bowel movements are regular. Sleep is good now. Completed therapies.  Does not need cane.  Denies pain.   Pain Inventory Average Pain 0 Pain Right Now 0 My pain is intermittent, dull and tingling  In the last 24 hours, has pain interfered with the following? General activity 0 Relation with others 0 Enjoyment of life 0 What TIME of day is your pain at its worst? varies Sleep (in general) Poor  Pain is worse with: sitting Pain improves with: rest Relief from Meds: No pain meds taken  walk without assistance use a cane how many minutes can you walk? 5 mins maybe ability to climb steps?  yes do you drive?  no Do you have any goals in this area?  yes  retired I need assistance with the following:  meal prep, shopping and meal transport to upstairs. Do you have any goals in this area?  yes  bowel control problems numbness tingling trouble walking  Any changes since last visit?  no NEW PATIENT  Any changes since last visit?  no NEW PATIENT    Family History  Problem Relation Age of Onset  . Heart disease Mother   . Brain cancer Father   . Hypertension Daughter    Social History   Socioeconomic History  . Marital status: Single    Spouse name: Not on file  . Number of children: Not on file  . Years of education: Not on  file  . Highest education level: Not on file  Occupational History  . Not on file  Tobacco Use  . Smoking status: Former Smoker    Packs/day: 0.00    Years: 30.00    Pack years: 0.00    Types: Cigarettes    Quit date: 05/31/2013    Years since quitting: 7.4  . Smokeless tobacco: Never Used  Vaping Use  . Vaping Use: Never used  Substance and Sexual Activity  . Alcohol use: Not Currently    Comment: ocassionally  . Drug use: Never  . Sexual activity: Not Currently  Other Topics Concern  . Not on file  Social History Narrative   Pt lies in split level home with his son, daughter and grand-son   Has 3 children   Some college education   Retired Scientist, forensic.    Right handed   Social Determinants of Health   Financial Resource Strain:   . Difficulty of Paying Living Expenses: Not on file  Food Insecurity:   . Worried About Charity fundraiser in the Last Year: Not on file  . Ran Out of Food in the Last Year: Not on file  Transportation Needs:   . Lack of Transportation (Medical): Not on file  . Lack of Transportation (Non-Medical): Not on file  Physical Activity:   . Days of Exercise per Week: Not on file  . Minutes of Exercise per Session:  Not on file  Stress:   . Feeling of Stress : Not on file  Social Connections:   . Frequency of Communication with Friends and Family: Not on file  . Frequency of Social Gatherings with Friends and Family: Not on file  . Attends Religious Services: Not on file  . Active Member of Clubs or Organizations: Not on file  . Attends Archivist Meetings: Not on file  . Marital Status: Not on file   Past Surgical History:  Procedure Laterality Date  . ACHILLES TENDON REPAIR     lft foot  . CARDIAC CATHETERIZATION N/A 05/05/2016   Procedure: Left Heart Cath and Coronary Angiography;  Surgeon: Leonie Man, MD;  Location: [facility];  Service: Cardiovascular;  Laterality: N/A;  . CARDIAC DEFIBRILLATOR  PLACEMENT    . CARDIOVERSION N/A 09/13/2020   Procedure: CARDIOVERSION;  Surgeon: Werner Lean, MD;  Location: [facility];  Service: Cardiovascular;  Laterality: N/A;  . CARDIOVERSION N/A 09/19/2020   Procedure: CARDIOVERSION;  Surgeon: [name], MD;  Location: [facility] ENDOSCOPY;  Service: Cardiovascular;  Laterality: N/A;  . EP IMPLANTABLE DEVICE N/A 12/19/2016   Procedure: ICD Generator Changeout;  Surgeon: [name], MD;  Location: [facility];  Service: Cardiovascular;  Laterality: N/A;  . RIGHT/LEFT HEART CATH AND CORONARY ANGIOGRAPHY N/A 09/24/2020   Procedure: RIGHT/LEFT HEART CATH AND CORONARY ANGIOGRAPHY;  Surgeon: Lorretta Harp, MD;  Location: [facility];  Service: Cardiovascular;  Laterality: N/A;  . WRIST TENODESIS     Past Medical History:  Diagnosis Date  . CAD (coronary artery disease) 12/01/2013  . Chronic combined systolic and diastolic CHF, NYHA class 1 ([place]) 12/01/2013  . Erectile dysfunction 12/01/2013  . HTN (hypertension) 12/01/2013  . Hyperlipidemia 12/01/2013  . Ischemic cardiomyopathy 12/01/2013  . Sleep apnea    BP (!) 134/91   Pulse 79   Temp 97.7 F (36.5 C)   Ht 6\' 6"  (1.981 m)   Wt 261 lb 12.8 oz (118.8 kg)   SpO2 95%   BMI 30.25 kg/m   Opioid Risk Score:   Fall Risk Score:  `1  Depression screen PHQ 2/9  Depression screen [facility] 2/9 11/01/2020 07/30/2018 06/29/2018  Decreased Interest 0 0 0  Down, Depressed, Hopeless 0 0 0  PHQ - 2 Score 0 0 0  Altered sleeping 3 3 3   Tired, decreased energy 1 0 0  Change in appetite 0 0 0  Feeling bad or failure about yourself  0 0 0  Trouble concentrating 0 0 0  Moving slowly or fidgety/restless 0 0 0  Suicidal thoughts 0 0 0  PHQ-9 Score 4 3 3   Some recent data might be hidden   Review of Systems  Musculoskeletal: Positive for gait problem.  Neurological: Positive for weakness and numbness.       TINGLING IN LEFT LEG  All other systems reviewed and are negative.      Objective:   Physical Exam  Constitutional: No distress . Vital signs reviewed. HENT: Normocephalic.  Atraumatic. Eyes: EOMI. No discharge. Left eye lipoma. Cardiovascular: No JVD.   Respiratory: Normal effort.  No stridor.   GI: Non-distended.   Skin: Warm and dry.  Intact. Psych: Normal mood.  Normal behavior. Musc: No edema in extremities.  No tenderness in extremities. Neuro: Alert Motor: Bilateral upper extremities: 5/5 proximal distal Bilateral lower extremities: Hip flexion, knee extension 5/5, ankle dorsiflexion 5/5    Assessment & Plan:  Right-handed  male with history of hyperlipidemia, seizure disorder, CAD, diastolic congestive heart failure, ischemic cardiomyopathy, VT status post AICD presents for hospital follow up for debility.  1. Debility secondary torenal failure and  atrial fibrillation with RVR  Cont HEP  2.  Diastolic congestive heart failure.    Encourage daily weight checks  Continue Lasix 40 mg twice daily, Coreg 80 mg daily, Entresto 97-103 mg twice daily.  Follow up with Cards.  3. History of seizure disorder.    Cont Keppra 500   4. DM  Cont meds  Relatively controlled.  Has questions regarding metformin  5. Sleep disturbance Cont  Improving

## 2020-11-02 MED ORDER — HYDROXYZINE HCL 10 MG PO TABS: 10.0000 mg | ORAL_TABLET | Freq: Three times a day (TID) | ORAL | 0 refills | Status: DC | PRN

## 2020-11-02 NOTE — Telephone Encounter (Signed)
I have refilled the medications, but as previously discussed, these are not medications I prescribe or follow as an outpatient.  Thanks.

## 2020-11-05 ENCOUNTER — Other Ambulatory Visit: Payer: Self-pay | Admitting: Physical Medicine and Rehabilitation

## 2020-11-05 NOTE — Telephone Encounter (Signed)
The impedance curve has flattened and is returnign towards baseline so would follow

## 2020-11-06 ENCOUNTER — Telehealth: Payer: Self-pay | Admitting: Cardiovascular Disease

## 2020-11-06 NOTE — Telephone Encounter (Signed)
Pt c/o medication issue:  1. Name of Medication: COREG CR 80 MG 24 hr capsule  2. How are you currently taking this medication (dosage and times per day)? 1 tablet daily  3. Are you having a reaction (difficulty breathing--STAT)? no  4. What is your medication issue? Patient states the pharmacy does not have the medication in the area and told him they are not able to order anymore. He states he only has 2 tablets left. Please advise.

## 2020-11-06 NOTE — Telephone Encounter (Signed)
Spoke with the patient's pharmacy. They stated that Coreg 80 mg would no longer be available.   They do have 40 mg tablets. Verbal was given for Coreg 40 mg: take two once daily to equal the 80 mg.  The patient has been made aware.

## 2020-11-07 ENCOUNTER — Other Ambulatory Visit: Payer: Self-pay

## 2020-11-07 ENCOUNTER — Other Ambulatory Visit: Payer: Self-pay | Admitting: Physical Medicine and Rehabilitation

## 2020-11-07 ENCOUNTER — Telehealth: Payer: Self-pay | Admitting: Cardiovascular Disease

## 2020-11-07 MED ORDER — APIXABAN 5 MG PO TABS
5.0000 mg | ORAL_TABLET | Freq: Two times a day (BID) | ORAL | 1 refills | Status: DC
Start: 2020-11-07 — End: 2021-07-16

## 2020-11-07 MED ORDER — POTASSIUM CHLORIDE CRYS ER 10 MEQ PO TBCR
10.0000 meq | EXTENDED_RELEASE_TABLET | Freq: Every day | ORAL | 0 refills | Status: DC
Start: 2020-11-07 — End: 2020-11-15

## 2020-11-07 NOTE — Telephone Encounter (Signed)
Discussed with Alena Bills D, ok to fill for 90 days only with note to call office for appoinment Message sent to scheduling to reach out to schedule appointment

## 2020-11-07 NOTE — Telephone Encounter (Signed)
Pt c/o medication issue:  1. Name of Medication: apixaban (ELIQUIS) 5 MG TABS tablet; potassium chloride (KLOR-CON) 10 MEQ tablet  2. How are you currently taking this medication (dosage and times per day)? As written  3. Are you having a reaction (difficulty breathing--STAT)? No  4. What is your medication issue? Needs new prescription sent over to pharmacist

## 2020-11-07 NOTE — Telephone Encounter (Signed)
Refill request for Trazodone. Is this okay to refill? I don't see in note to continue.

## 2020-11-07 NOTE — Telephone Encounter (Signed)
This is Dr. Croitoru's pt 

## 2020-11-10 ENCOUNTER — Other Ambulatory Visit: Payer: Self-pay | Admitting: Cardiology

## 2020-11-12 ENCOUNTER — Other Ambulatory Visit: Payer: Self-pay | Admitting: Cardiovascular Disease

## 2020-11-12 ENCOUNTER — Other Ambulatory Visit: Payer: Self-pay

## 2020-11-12 ENCOUNTER — Other Ambulatory Visit: Payer: Self-pay | Admitting: Physical Medicine and Rehabilitation

## 2020-11-12 MED ORDER — AMIODARONE HCL 200 MG PO TABS
200.0000 mg | ORAL_TABLET | Freq: Every day | ORAL | 0 refills | Status: DC
Start: 2020-11-12 — End: 2020-12-10

## 2020-11-12 NOTE — Telephone Encounter (Signed)
Pt's medication was sent to pt's pharmacy as requested. Confirmation received.  °

## 2020-11-14 ENCOUNTER — Telehealth: Payer: Self-pay

## 2020-11-14 NOTE — Telephone Encounter (Signed)
Prior authorization request received for pt's pantoprazole. Will defer until pt's next office visit.

## 2020-11-15 ENCOUNTER — Other Ambulatory Visit: Payer: Self-pay

## 2020-11-15 ENCOUNTER — Encounter: Payer: Self-pay | Admitting: Student

## 2020-11-15 ENCOUNTER — Ambulatory Visit (INDEPENDENT_AMBULATORY_CARE_PROVIDER_SITE_OTHER): Payer: Worker's Compensation | Admitting: Student

## 2020-11-15 VITALS — BP 128/90 | HR 81 | Ht 78.0 in | Wt 258.0 lb

## 2020-11-15 DIAGNOSIS — I472 Ventricular tachycardia, unspecified: Secondary | ICD-10-CM

## 2020-11-15 DIAGNOSIS — I10 Essential (primary) hypertension: Secondary | ICD-10-CM | POA: Diagnosis not present

## 2020-11-15 DIAGNOSIS — Z95810 Presence of automatic (implantable) cardiac defibrillator: Secondary | ICD-10-CM

## 2020-11-15 DIAGNOSIS — I2510 Atherosclerotic heart disease of native coronary artery without angina pectoris: Secondary | ICD-10-CM

## 2020-11-15 DIAGNOSIS — I5042 Chronic combined systolic (congestive) and diastolic (congestive) heart failure: Secondary | ICD-10-CM

## 2020-11-15 DIAGNOSIS — I255 Ischemic cardiomyopathy: Secondary | ICD-10-CM

## 2020-11-15 DIAGNOSIS — Z9861 Coronary angioplasty status: Secondary | ICD-10-CM

## 2020-11-15 LAB — CUP PACEART INCLINIC DEVICE CHECK
Battery Remaining Longevity: 66 mo
Battery Voltage: 2.99 V
Brady Statistic AP VP Percent: 0.01 %
Brady Statistic AP VS Percent: 4.84 %
Brady Statistic AS VP Percent: 0.04 %
Brady Statistic AS VS Percent: 95.1 %
Brady Statistic RA Percent Paced: 4.85 %
Brady Statistic RV Percent Paced: 0.06 %
Date Time Interrogation Session: 20211202094656
HighPow Impedance: 45 Ohm
HighPow Impedance: 58 Ohm
Implantable Lead Implant Date: 20051120
Implantable Lead Implant Date: 20101117
Implantable Lead Location: 753859
Implantable Lead Location: 753860
Implantable Lead Model: 5076
Implantable Lead Model: 7121
Implantable Pulse Generator Implant Date: 20180105
Lead Channel Impedance Value: 304 Ohm
Lead Channel Impedance Value: 399 Ohm
Lead Channel Impedance Value: 456 Ohm
Lead Channel Pacing Threshold Amplitude: 0.625 V
Lead Channel Pacing Threshold Amplitude: 0.875 V
Lead Channel Pacing Threshold Pulse Width: 0.4 ms
Lead Channel Pacing Threshold Pulse Width: 0.4 ms
Lead Channel Sensing Intrinsic Amplitude: 18.625 mV
Lead Channel Sensing Intrinsic Amplitude: 19.875 mV
Lead Channel Sensing Intrinsic Amplitude: 3.625 mV
Lead Channel Sensing Intrinsic Amplitude: 3.875 mV
Lead Channel Setting Pacing Amplitude: 1.5 V
Lead Channel Setting Pacing Amplitude: 2 V
Lead Channel Setting Pacing Pulse Width: 0.4 ms
Lead Channel Setting Sensing Sensitivity: 0.6 mV

## 2020-11-15 LAB — CBC WITH DIFFERENTIAL/PLATELET
Basophils Absolute: 0 10*3/uL (ref 0.0–0.2)
Basos: 0 %
EOS (ABSOLUTE): 0.1 10*3/uL (ref 0.0–0.4)
Eos: 2 %
Hematocrit: 36.6 % — ABNORMAL LOW (ref 37.5–51.0)
Hemoglobin: 12.4 g/dL — ABNORMAL LOW (ref 13.0–17.7)
Immature Grans (Abs): 0 10*3/uL (ref 0.0–0.1)
Immature Granulocytes: 0 %
Lymphocytes Absolute: 1.7 10*3/uL (ref 0.7–3.1)
Lymphs: 22 %
MCH: 31.2 pg (ref 26.6–33.0)
MCHC: 33.9 g/dL (ref 31.5–35.7)
MCV: 92 fL (ref 79–97)
Monocytes Absolute: 0.5 10*3/uL (ref 0.1–0.9)
Monocytes: 7 %
Neutrophils Absolute: 5.5 10*3/uL (ref 1.4–7.0)
Neutrophils: 69 %
Platelets: 265 10*3/uL (ref 150–450)
RBC: 3.98 x10E6/uL — ABNORMAL LOW (ref 4.14–5.80)
RDW: 13.1 % (ref 11.6–15.4)
WBC: 7.9 10*3/uL (ref 3.4–10.8)

## 2020-11-15 LAB — COMPREHENSIVE METABOLIC PANEL
ALT: 15 IU/L (ref 0–44)
AST: 12 IU/L (ref 0–40)
Albumin/Globulin Ratio: 1.3 (ref 1.2–2.2)
Albumin: 4 g/dL (ref 3.8–4.8)
Alkaline Phosphatase: 76 IU/L (ref 44–121)
BUN/Creatinine Ratio: 14 (ref 10–24)
BUN: 14 mg/dL (ref 8–27)
Bilirubin Total: 0.3 mg/dL (ref 0.0–1.2)
CO2: 26 mmol/L (ref 20–29)
Calcium: 9.1 mg/dL (ref 8.6–10.2)
Chloride: 99 mmol/L (ref 96–106)
Creatinine, Ser: 1.02 mg/dL (ref 0.76–1.27)
GFR calc Af Amer: 91 mL/min/{1.73_m2} (ref >59)
GFR calc non Af Amer: 79 mL/min/{1.73_m2} (ref >59)
Globulin, Total: 3 g/dL (ref 1.5–4.5)
Glucose: 117 mg/dL — ABNORMAL HIGH (ref 65–99)
Potassium: 4.6 mmol/L (ref 3.5–5.2)
Sodium: 138 mmol/L (ref 134–144)
Total Protein: 7 g/dL (ref 6.0–8.5)

## 2020-11-15 MED ORDER — METOPROLOL SUCCINATE ER 100 MG PO TB24: 100.0000 mg | ORAL_TABLET | Freq: Every day | ORAL | 3 refills | Status: DC

## 2020-11-15 NOTE — Patient Instructions (Addendum)
Medication Instructions:  Your physician has recommended you make the following change in your medication:  -- STOP Coreg -- INCREASE Metoprolol Succinate (Toprol XL) to 100 mg - Take 1 tablet my mouth daily -- NEW RX SENT *If you need a refill on your cardiac medications before your next appointment, please call your pharmacy*  Lab Work: Your physician has recommended that you have lab work today: CMET and CBC If you have labs (blood work) drawn today and your tests are completely normal, you will receive your results only by: Marland Kitchen MyChart Message (if you have MyChart) OR . A paper copy in the mail If you have any lab test that is abnormal or we need to change your treatment, we will call you to review the results.  Follow-Up: At [facility], you and your health needs are our priority.  As part of our continuing mission to provide you with exceptional heart care, we have created designated Provider Care Teams.  These Care Teams include your primary Cardiologist (physician) and Advanced Practice Providers (APPs -  Physician Assistants and Nurse Practitioners) who all work together to provide you with the care you need, when you need it.  We recommend signing up for the patient portal called "MyChart".  Sign up information is provided on this After Visit Summary.  MyChart is used to connect with patients for Virtual Visits (Telemedicine).  Patients are able to view lab/test results, encounter notes, upcoming appointments, etc.  Non-urgent messages can be sent to your provider as well.   To learn more about what you can do with MyChart, go to [email or web address].    Your next appointment:   Your physician recommends that you schedule a follow-up appointment in: 3 MONTHS with Dr. Sallyanne Kuster.  Your physician wants you to follow-up in: 1 YEAR with Dr. Caryl Comes. You will receive a reminder letter in the mail two months in advance. If you don't receive a letter, please call our office to schedule the  follow-up appointment.  Remote monitoring is used to monitor your ICD from home. This monitoring reduces the number of office visits required to check your device to one time per year. It allows [place] to keep an eye on the functioning of your device to ensure it is working properly. You are scheduled for a device check from home on 01/17/21. You may send your transmission at any time that day. If you have a wireless device, the transmission will be sent automatically. After your physician reviews your transmission, you will receive a postcard with your next transmission date.  The format for your next appointment:   In Person with Virl Axe, MD   Thank you for choosing [facility] !!

## 2020-11-15 NOTE — Progress Notes (Signed)
Electrophysiology Office Note Date: 11/15/2020  ID:  Gregory [name], Gregory [name] [birth year], MRN [id number]  PCP: Patient, No Pcp Per Primary Cardiologist: Sanda Klein, MD  CC: Routine ICD follow-up  Gregory [name] is a 59 y.o. male seen today for Dr. Sallyanne Kuster for post hospital follow up.  Since discharge from hospital the patient reports doing very well. He is walking up steps several times before he gets any SOB. He spent nearly 2 weeks in rehab and feels like he is stronger. he denies chest pain, palpitations, dyspnea, PND, orthopnea, nausea, vomiting, dizziness, syncope, edema, weight gain, or early satiety. He has not had ICD shocks.   Device History: Medtronic Dual Chamber ICD implanted 2005, 2010 and gen change for 2018 History of AAD therapy: Yes; currently on amiodarone   Past Medical History:  Diagnosis Date  . CAD (coronary artery disease) 12/01/2013  . Chronic combined systolic and diastolic CHF, NYHA class 1 ([place]) 12/01/2013  . Erectile dysfunction 12/01/2013  . HTN (hypertension) 12/01/2013  . Hyperlipidemia 12/01/2013  . Ischemic cardiomyopathy 12/01/2013  . Sleep apnea    Past Surgical History:  Procedure Laterality Date  . ACHILLES TENDON REPAIR     lft foot  . CARDIAC CATHETERIZATION N/A 05/05/2016   Procedure: Left Heart Cath and Coronary Angiography;  Surgeon: Leonie Man, MD;  Location: [facility];  Service: Cardiovascular;  Laterality: N/A;  . CARDIAC DEFIBRILLATOR PLACEMENT    . CARDIOVERSION N/A 09/13/2020   Procedure: CARDIOVERSION;  Surgeon: Werner Lean, MD;  Location: [facility] ENDOSCOPY;  Service: Cardiovascular;  Laterality: N/A;  . CARDIOVERSION N/A 09/19/2020   Procedure: CARDIOVERSION;  Surgeon: [name], MD;  Location: [facility] ENDOSCOPY;  Service: Cardiovascular;  Laterality: N/A;  . EP IMPLANTABLE DEVICE N/A 12/19/2016   Procedure: ICD Generator Changeout;  Surgeon: [name], MD;  Location: [place] [facility];  Service: Cardiovascular;  Laterality: N/A;  . RIGHT/LEFT HEART CATH AND CORONARY ANGIOGRAPHY N/A 09/24/2020   Procedure: RIGHT/LEFT HEART CATH AND CORONARY ANGIOGRAPHY;  Surgeon: Lorretta Harp, MD;  Location: [place] [facility];  Service: Cardiovascular;  Laterality: N/A;  . WRIST TENODESIS      Current Outpatient Medications  Medication Sig Dispense Refill  . amiodarone (PACERONE) 200 MG tablet Take 1 tablet (200 mg total) by mouth daily. Please keep upcoming appt in December for future refills. Thank you 30 tablet 0  . apixaban (ELIQUIS) 5 MG TABS tablet Take 1 tablet (5 mg total) by mouth 2 (two) times daily. Please put note on bag to call office for appointment 180 tablet 1  . atorvastatin (LIPITOR) 80 MG tablet Take 1 tablet (80 mg total) by mouth every evening. 30 tablet 0  . clopidogrel (PLAVIX) 75 MG tablet TAKE 1 TABLET(75 MG) BY MOUTH DAILY 90 tablet 2  . docusate sodium (COLACE) 100 MG capsule Take 1 capsule (100 mg total) by mouth 2 (two) times daily. 10 capsule 0  . ELIQUIS 5 MG TABS tablet TAKE 1 TABLET(5 MG) BY MOUTH TWICE DAILY 60 tablet 0  . ENTRESTO 97-103 MG TAKE 1 TABLET BY MOUTH TWICE DAILY 60 tablet 10  . furosemide (LASIX) 40 MG tablet Take 1 tablet (40 mg total) by mouth 2 (two) times daily. 60 tablet 0  . hydrOXYzine (ATARAX/VISTARIL) 10 MG tablet Take 1 tablet (10 mg total) by mouth 3 (three) times daily as needed for itching. 30 tablet 0  . levETIRAcetam (KEPPRA) 500 MG tablet TAKE 1 TABLET(500 MG) BY  MOUTH TWICE DAILY 60 tablet 0  . melatonin 5 MG TABS Take 1 tablet (5 mg total) by mouth at bedtime. 30 tablet 0  . metFORMIN (GLUCOPHAGE) 500 MG tablet Take 0.5 tablets (250 mg total) by mouth daily with breakfast. 30 tablet 0  . oxyCODONE (OXY IR/ROXICODONE) 5 MG immediate release tablet Take 1 tablet (5 mg total) by mouth every 12 (twelve) hours as needed for severe pain. 10 tablet 0  . pantoprazole (PROTONIX) 40 MG tablet TAKE 1 TABLET(40 MG) BY MOUTH DAILY  30 tablet 2  . polyethylene glycol (MIRALAX / GLYCOLAX) 17 g packet Take 17 g by mouth daily as needed for moderate constipation or severe constipation. 30 each 0  . potassium chloride (KLOR-CON) 10 MEQ tablet TAKE 1 TABLET(10 MEQ) BY MOUTH DAILY 30 tablet 0  . traZODone (DESYREL) 100 MG tablet Take 1 tablet (100 mg total) by mouth at bedtime as needed for sleep. 30 tablet 0  . zolpidem (AMBIEN) 5 MG tablet Take 0.5 tablets (2.5 mg total) by mouth at bedtime. 30 tablet 0  . metoprolol succinate (TOPROL-XL) 100 MG 24 hr tablet Take 1 tablet (100 mg total) by mouth daily. Take with or immediately following a meal. 90 tablet 3   No current facility-administered medications for this visit.    Allergies:   Patient has no known allergies.   Social History: Social History   Socioeconomic History  . Marital status: Single    Spouse name: Not on file  . Number of children: Not on file  . Years of education: Not on file  . Highest education level: Not on file  Occupational History  . Not on file  Tobacco Use  . Smoking status: Former Smoker    Packs/day: 0.00    Years: 30.00    Pack years: 0.00    Types: Cigarettes    Quit date: 05/31/2013    Years since quitting: 7.4  . Smokeless tobacco: Never Used  Vaping Use  . Vaping Use: Never used  Substance and Sexual Activity  . Alcohol use: Not Currently    Comment: ocassionally  . Drug use: Never  . Sexual activity: Not Currently  Other Topics Concern  . Not on file  Social History Narrative   Pt lies in split level home with his son, daughter and grand-son   Has 3 children   Some college education   Retired Scientist, forensic.    Right handed   Social Determinants of Health   Financial Resource Strain:   . Difficulty of Paying Living Expenses: Not on file  Food Insecurity:   . Worried About Charity fundraiser in the Last Year: Not on file  . Ran Out of Food in the Last Year: Not on file  Transportation Needs:   .  Lack of Transportation (Medical): Not on file  . Lack of Transportation (Non-Medical): Not on file  Physical Activity:   . Days of Exercise per Week: Not on file  . Minutes of Exercise per Session: Not on file  Stress:   . Feeling of Stress : Not on file  Social Connections:   . Frequency of Communication with Friends and Family: Not on file  . Frequency of Social Gatherings with Friends and Family: Not on file  . Attends Religious Services: Not on file  . Active Member of Clubs or Organizations: Not on file  . Attends Archivist Meetings: Not on file  . Marital Status: Not on file  Intimate Partner Violence:   . Fear of Current or Ex-Partner: Not on file  . Emotionally Abused: Not on file  . Physically Abused: Not on file  . Sexually Abused: Not on file    Family History: Family History  Problem Relation Age of Onset  . Heart disease Mother   . Brain cancer Father   . Hypertension Daughter     Review of Systems: All other systems reviewed and are otherwise negative except as noted above.   Physical Exam: Vitals:   11/15/20 0921  BP: 128/90  Pulse: 81  SpO2: 96%  Weight: 258 lb (117 kg)  Height: '6\' 6"'  (1.981 m)     GEN- The patient is well appearing, alert and oriented x 3 today.   HEENT: normocephalic, atraumatic; sclera clear, conjunctiva pink; hearing intact; oropharynx clear; neck supple, no JVP Lymph- no cervical lymphadenopathy Lungs- Clear to ausculation bilaterally, normal work of breathing.  No wheezes, rales, rhonchi Heart- Regular rate and rhythm, no murmurs, rubs or gallops, PMI not laterally displaced GI- soft, non-tender, non-distended, bowel sounds present, no hepatosplenomegaly Extremities- no clubbing or cyanosis. No edema; DP/PT/radial pulses 2+ bilaterally MS- no significant deformity or atrophy Skin- warm and dry, no rash or lesion; ICD pocket well healed Psych- euthymic mood, full affect Neuro- strength and sensation are  intact  ICD interrogation- reviewed in detail today,  See PACEART report  EKG:  EKG is not ordered today.  Recent Labs: 09/10/2020: TSH 0.747 09/27/2020: Magnesium 1.8 10/01/2020: ALT 32; BUN 6; Creatinine, Ser 0.98; Potassium 3.8; Sodium 138 10/08/2020: Hemoglobin 10.5; Platelets 308   Wt Readings from Last 3 Encounters:  11/15/20 258 lb (117 kg)  11/01/20 261 lb 12.8 oz (118.8 kg)  10/10/20 278 lb (126.1 kg)     Other studies Reviewed: Additional studies/ records that were reviewed today include: Recent admission notes. Previous  EP notes  [facility] 09/24/2020  Ost RCA to Mid RCA lesion is 100% stenosed.  Previously placed 1st Mrg stent (unknown type) is widely patent.  Prox Cx lesion is 100% stenosed.  Previously placed 1st Diag stent (unknown type) is widely patent.  Previously placed Prox LAD to Mid LAD stent (unknown type) is widely patent.  Echo 09/24/2020 LVEF 40-45%, Grade 1 DD  Assessment and Plan:  1.  Chronic systolic dysfunction s/p Medtronic dual chamber ICD  NYHA II symptoms.  Volume status stable on exam STOP coreg CR which has become difficult to find.  Increase Metoprolol to 100 mg daily (as he was on max dose Coreg CR). Can titrate further as needed.  Continue Entresto 97/103 mg BID Continue lasix 40 mg daily with potassium 10 meq daily Consider spironolactone Normal ICD function See Pace Art report No changes today  2. Afib in setting of shock He had ERAF s/p DCC last week, now holding NSR s/p [facility] 10/6 after further amio load.  Continue amio, po dilt, and eliquis. No insurance, may be difficult to continue Eliquis long term.CHA2DS2Vasc is 3.   3. AKI While admitted. Will recheck BMET today.   4. CAD with prior PCI Echo 09/10/2020 LVEF 55-60% Denies s/s ischemia.   5. Hx of VT Has MDT ICD Increased PVCs PreservedEFEcho10/2021 Keep K > 4.0 and Mg > 2.0. Labs today.    Current medicines are reviewed at length with the patient  today.   The patient does not have concerns regarding his medicines.  The following changes were made today:  Stop coreg CR (outdated, difficult for him to find  at times).  Increase metoprolol to make up for it. Can increase further as needed.  Labs/ tests ordered today include:  Orders Placed This Encounter  Procedures  . CBC w/Diff  . Comp Met (CMET)   Disposition:   Follow up with Dr. Sallyanne Kuster in 3 months. Dr. Caryl Comes in 12 months.   Jacalyn Lefevre, PA-C  11/15/2020 9:46 AM  [facility] [address] [place]  [address] [phone] (office) [phone] (fax)

## 2020-11-16 ENCOUNTER — Telehealth: Payer: Self-pay

## 2020-11-16 NOTE — Telephone Encounter (Signed)
**Note De-Identified  Obfuscation** I called [facility] pharmacy to advise that the pt has Workers Comp and asked them to re-run his RX under workers comp to see if it is covered. They did and advised me that now the pts Metoprolol will cost him $0. They state that they will fill and notify the pt that he can pick up.

## 2020-11-18 ENCOUNTER — Other Ambulatory Visit: Payer: Self-pay | Admitting: Cardiovascular Disease

## 2020-11-19 ENCOUNTER — Other Ambulatory Visit: Payer: Self-pay | Admitting: Cardiovascular Disease

## 2020-11-19 MED ORDER — FUROSEMIDE 40 MG PO TABS
40.0000 mg | ORAL_TABLET | Freq: Two times a day (BID) | ORAL | 3 refills | Status: DC
Start: 2020-11-19 — End: 2020-11-30

## 2020-11-19 NOTE — Telephone Encounter (Signed)
*  STAT* If patient is at the pharmacy, call can be transferred to refill team.   1. Which medications need to be refilled? (please list name of each medication and dose if known)   furosemide (LASIX) 40 MG tablet    2. Which pharmacy/location (including street and city if local pharmacy) is medication to be sent to? [facility] [address], [place]  3. Do they need a 30 day or 90 day supply? 30 day supply   Took last tablet today.

## 2020-11-30 ENCOUNTER — Other Ambulatory Visit: Payer: Self-pay

## 2020-11-30 MED ORDER — FUROSEMIDE 40 MG PO TABS
40.0000 mg | ORAL_TABLET | Freq: Two times a day (BID) | ORAL | 3 refills | Status: DC
Start: 2020-11-30 — End: 2021-03-05

## 2020-11-30 MED ORDER — METFORMIN HCL 500 MG PO TABS: 250.0000 mg | ORAL_TABLET | Freq: Every day | ORAL | 0 refills | Status: DC

## 2020-11-30 NOTE — Telephone Encounter (Signed)
Pt said that Jonni Sanger agreed to send in a refill on his Metformin one more time. I don't see that it's documented in his last o/v so I wasn't sure about sending it. If it's ok please send it to [facility] on [address]

## 2020-11-30 NOTE — Telephone Encounter (Signed)
OK to refill Metformin x 1. Needs to follow up with his PCP.

## 2020-12-06 ENCOUNTER — Other Ambulatory Visit: Payer: Self-pay | Admitting: Physical Medicine & Rehabilitation

## 2020-12-09 ENCOUNTER — Other Ambulatory Visit: Payer: Self-pay | Admitting: Internal Medicine

## 2020-12-11 ENCOUNTER — Ambulatory Visit: Payer: Self-pay | Admitting: Physician Assistant

## 2020-12-11 ENCOUNTER — Other Ambulatory Visit: Payer: Self-pay | Admitting: Neurology

## 2020-12-13 ENCOUNTER — Telehealth: Payer: Self-pay | Admitting: *Deleted

## 2020-12-13 NOTE — Telephone Encounter (Signed)
Eliquis Prio auth sent through CoverMyMeds: [facility]

## 2020-12-20 ENCOUNTER — Encounter: Payer: Self-pay | Admitting: Neurology

## 2020-12-20 NOTE — Progress Notes (Signed)
12/30- received PA forms through fax from insurance. Submitted back to them same day

## 2020-12-24 ENCOUNTER — Encounter: Payer: Self-pay | Admitting: Neurology

## 2021-01-04 ENCOUNTER — Other Ambulatory Visit: Payer: Self-pay | Admitting: Physical Medicine & Rehabilitation

## 2021-01-06 ENCOUNTER — Other Ambulatory Visit: Payer: Self-pay | Admitting: Internal Medicine

## 2021-01-08 ENCOUNTER — Other Ambulatory Visit: Payer: Self-pay | Admitting: Neurology

## 2021-01-08 ENCOUNTER — Other Ambulatory Visit: Payer: Self-pay

## 2021-01-09 MED ORDER — POTASSIUM CHLORIDE CRYS ER 10 MEQ PO TBCR: EXTENDED_RELEASE_TABLET | ORAL | 0 refills | Status: DC

## 2021-01-09 MED ORDER — CLOPIDOGREL BISULFATE 75 MG PO TABS: ORAL_TABLET | ORAL | 2 refills | Status: DC

## 2021-01-17 ENCOUNTER — Telehealth: Payer: Self-pay

## 2021-01-17 ENCOUNTER — Ambulatory Visit (INDEPENDENT_AMBULATORY_CARE_PROVIDER_SITE_OTHER): Payer: Worker's Compensation

## 2021-01-17 DIAGNOSIS — I255 Ischemic cardiomyopathy: Secondary | ICD-10-CM

## 2021-01-17 LAB — CUP PACEART REMOTE DEVICE CHECK
Battery Remaining Longevity: 75 mo
Battery Voltage: 2.99 V
Brady Statistic AP VP Percent: 0.11 %
Brady Statistic AP VS Percent: 34.48 %
Brady Statistic AS VP Percent: 0.06 %
Brady Statistic AS VS Percent: 65.35 %
Brady Statistic RA Percent Paced: 33.57 %
Brady Statistic RV Percent Paced: 0.18 %
Date Time Interrogation Session: 20220203002204
HighPow Impedance: 41 Ohm
HighPow Impedance: 51 Ohm
Implantable Lead Implant Date: 20051120
Implantable Lead Implant Date: 20101117
Implantable Lead Location: 753859
Implantable Lead Location: 753860
Implantable Lead Model: 5076
Implantable Lead Model: 7121
Implantable Pulse Generator Implant Date: 20180105
Lead Channel Impedance Value: 304 Ohm
Lead Channel Impedance Value: 399 Ohm
Lead Channel Impedance Value: 399 Ohm
Lead Channel Pacing Threshold Amplitude: 0.625 V
Lead Channel Pacing Threshold Amplitude: 1 V
Lead Channel Pacing Threshold Pulse Width: 0.4 ms
Lead Channel Pacing Threshold Pulse Width: 0.4 ms
Lead Channel Sensing Intrinsic Amplitude: 16.125 mV
Lead Channel Sensing Intrinsic Amplitude: 16.125 mV
Lead Channel Sensing Intrinsic Amplitude: 3.25 mV
Lead Channel Sensing Intrinsic Amplitude: 3.25 mV
Lead Channel Setting Pacing Amplitude: 1.5 V
Lead Channel Setting Pacing Amplitude: 2 V
Lead Channel Setting Pacing Pulse Width: 0.4 ms
Lead Channel Setting Sensing Sensitivity: 0.6 mV

## 2021-01-17 NOTE — Telephone Encounter (Signed)
L  agree Thanks SK

## 2021-01-17 NOTE — Telephone Encounter (Signed)
[facility] scheduled remote received 01/17/21. 1 VT event logged on 01/08/2021 @ 13:20 that required ATP X1, successfully terminated. Optivol remains elevated/impedance below reference line since the end of September. Patient reports he was not aware of ATP and has had no complaints. Denies any recent changes in activity. Denies fluid retention symptoms such as swelling in legs/feet, shortness of breath or difficulty sleeping at night. Reports compliance with medication including Amiodarone 200 mg daily, Eliquis 5 mg BID, Plavix 75 mg daily, Entresto 97-103 mg BID, Lasix 40 mg BID, Toprol- XL 100 mg daily.  Advised patient that per [place] DMV he is advised not to drive for 6 months with a start date of 01/08/21. Verbalized understanding. Shock plan reviewed with patient. Advised I will forward to Dr. Caryl Comes and we will call with any changes recommended. Advised patient to call with further questions or concerns. Patient agreeable and verbalized understanding.

## 2021-01-23 NOTE — Progress Notes (Signed)
Remote ICD transmission.   

## 2021-01-25 ENCOUNTER — Other Ambulatory Visit: Payer: Self-pay | Admitting: Student

## 2021-01-25 DIAGNOSIS — N529 Male erectile dysfunction, unspecified: Secondary | ICD-10-CM

## 2021-01-25 MED ORDER — SILDENAFIL CITRATE 50 MG PO TABS: 50.0000 mg | ORAL_TABLET | ORAL | 4 refills | Status: DC | PRN

## 2021-01-25 NOTE — Progress Notes (Signed)
Pt requested refill of Viagra. He had not used since prior to [place].   Discussed personally with Dr. Sallyanne Kuster who states OK to resume. Reminded patient of precautions around the use of any nitrates.   Legrand Como [address]" [place], PA-C  01/25/2021 1:05 PM

## 2021-01-25 NOTE — Telephone Encounter (Signed)
Ok to resume, just remind of precautions w nitrates

## 2021-01-31 ENCOUNTER — Telehealth: Payer: Self-pay

## 2021-01-31 NOTE — Telephone Encounter (Signed)
**Note De-Identified  Obfuscation** I started a Sildenafil PA through covermymeds. Key: [id number]

## 2021-02-05 ENCOUNTER — Other Ambulatory Visit: Payer: Self-pay | Admitting: Cardiovascular Disease

## 2021-02-06 NOTE — Telephone Encounter (Signed)
I spoke to pt. He has already picked up this Rx and paid cash price. Per Pharmacy it cost him $13.37 with Good Rx.

## 2021-02-08 NOTE — Progress Notes (Signed)
Patient is no longer covered through the insurance

## 2021-02-09 ENCOUNTER — Other Ambulatory Visit: Payer: Self-pay | Admitting: Cardiovascular Disease

## 2021-02-10 ENCOUNTER — Other Ambulatory Visit: Payer: Self-pay | Admitting: Neurology

## 2021-02-14 ENCOUNTER — Telehealth: Payer: Self-pay | Admitting: Neurology

## 2021-02-14 MED ORDER — LEVETIRACETAM 500 MG PO TABS: 500.0000 mg | ORAL_TABLET | Freq: Two times a day (BID) | ORAL | 6 refills | Status: DC

## 2021-02-14 NOTE — Telephone Encounter (Signed)
Pt states that he was told that he needed appt with Aquinio  Before we could refill the Keppra medication. He has had appt on 03-20-21 with her on the books for a while so he does not understand why we would not refill his medication   He has taken the last keprra pill  Pt uses the [facility]

## 2021-02-14 NOTE — Telephone Encounter (Signed)
He is right, he can have refills until his f/u, I will send it in, thanks

## 2021-02-14 NOTE — Telephone Encounter (Signed)
Called patient and informed him that refills of Keppra have been sent to his pharmacy.

## 2021-02-22 ENCOUNTER — Telehealth: Payer: Self-pay | Admitting: Emergency Medicine

## 2021-02-22 NOTE — Telephone Encounter (Signed)
Remote transmission received that showed Optivol elevated and thoracic impedance below baseline. Patient asymptomatic. Has follow-up with Charlcie Cradle PA 03/04/21 and he will call if anything changes with his condition.

## 2021-02-26 NOTE — Progress Notes (Signed)
Cardiology Office Note Date:  03/04/2021  Patient ID:  Gregory, [name] 1961/01/24, MRN [id number] PCP:  Patient, No Pcp Per  Cardiologist:  Dr. Sallyanne Kuster EP: Dr. Caryl Comes   Chief Complaint: abnormal remote  History of Present Illness: Gregory [name] is a 61 y.o. male with history of CAD (known chronic occl of RCA with prior PCIs to LAD, diag, and LCx), VT, ICM,  OSA w/CPAP, seizures, chronic CHF, HLD, HTN, obesity, AFib, h/o VT, PVCs.  He comes in today to be seen for Dr. Recardo Evangelist. Caryl Comes.  He last saw our service Dec 2021 with A. T[place], [place], after a hospital stay with shock in Oct 2021, VT, AF > discharged to rehab. The pt was doing well, felt stronger and improved with rehab. Coreg CR was stopped (unable to get any longer) metoprolol started/increased.  Device noted no further VT, noted PVCs.  TODAY Has been doing pretty well, lost quite a lot of weight during his hospital and rehab stay late last year. Feels like he has kept it off, does not think he is retaining fluid Denies any CP, palpitations or cardiac awareness. Will get winded with about 14 steps, walks comfortably on flat ground.  Rides his stationary bike an hour a day with good exertional capacity. No dizzy spells, near syncope or syncope. No shocks Taking all of his medicines.   Device information Medtronic Dual Chamber ICD implanted 2005, 2010 and gen change for 2018   2017 to have slow VT prompting reduction of his VT zone rate to 150bpm.   Oct 2021, VT with appropriate therapies  He had stopped drinking (heavily at least).  He noted previously had been on amiodarone and mexiletine , the mexiletine stopped 2/2 GI symptoms, and the amiodarone stopped 2/2 abn ALk phos  Oct 2021 > amio restarted for VT  Past Medical History:  Diagnosis Date  . CAD (coronary artery disease) 12/01/2013  . Chronic combined systolic and diastolic CHF, NYHA class 1 ([place]) 12/01/2013  . Erectile dysfunction  12/01/2013  . HTN (hypertension) 12/01/2013  . Hyperlipidemia 12/01/2013  . Ischemic cardiomyopathy 12/01/2013  . Sleep apnea     Past Surgical History:  Procedure Laterality Date  . ACHILLES TENDON REPAIR     lft foot  . CARDIAC CATHETERIZATION N/A 05/05/2016   Procedure: Left Heart Cath and Coronary Angiography;  Surgeon: Leonie Man, MD;  Location: [facility];  Service: Cardiovascular;  Laterality: N/A;  . CARDIAC DEFIBRILLATOR PLACEMENT    . CARDIOVERSION N/A 09/13/2020   Procedure: CARDIOVERSION;  Surgeon: Werner Lean, MD;  Location: [facility] ENDOSCOPY;  Service: Cardiovascular;  Laterality: N/A;  . CARDIOVERSION N/A 09/19/2020   Procedure: CARDIOVERSION;  Surgeon: [name], MD;  Location: [facility] ENDOSCOPY;  Service: Cardiovascular;  Laterality: N/A;  . EP IMPLANTABLE DEVICE N/A 12/19/2016   Procedure: ICD Generator Changeout;  Surgeon: [name], MD;  Location: [facility];  Service: Cardiovascular;  Laterality: N/A;  . RIGHT/LEFT HEART CATH AND CORONARY ANGIOGRAPHY N/A 09/24/2020   Procedure: RIGHT/LEFT HEART CATH AND CORONARY ANGIOGRAPHY;  Surgeon: Lorretta Harp, MD;  Location: [facility];  Service: Cardiovascular;  Laterality: N/A;  . WRIST TENODESIS      Current Outpatient Medications  Medication Sig Dispense Refill  . amiodarone (PACERONE) 200 MG tablet TAKE 1 TABLET BY MOUTH DAILY 30 tablet 11  . apixaban (ELIQUIS) 5 MG TABS tablet Take 1 tablet (5 mg total) by mouth 2 (two) times daily. Please  put note on bag to call office for appointment 180 tablet 1  . atorvastatin (LIPITOR) 80 MG tablet TAKE ONE TABLET BY MOUTH EVERY EVENING 90 tablet 0  . clopidogrel (PLAVIX) 75 MG tablet TAKE 1 TABLET(75 MG) BY MOUTH DAILY 90 tablet 2  . docusate sodium (COLACE) 100 MG capsule Take 1 capsule (100 mg total) by mouth 2 (two) times daily. 10 capsule 0  . ELIQUIS 5 MG TABS tablet TAKE 1 TABLET(5 MG) BY MOUTH TWICE DAILY 60 tablet 0  . ENTRESTO  97-103 MG TAKE 1 TABLET BY MOUTH TWICE DAILY 60 tablet 10  . furosemide (LASIX) 40 MG tablet Take 1 tablet (40 mg total) by mouth 2 (two) times daily. 180 tablet 3  . levETIRAcetam (KEPPRA) 500 MG tablet Take 1 tablet (500 mg total) by mouth 2 (two) times daily. 60 tablet 6  . melatonin 5 MG TABS Take 1 tablet (5 mg total) by mouth at bedtime. 30 tablet 0  . metFORMIN (GLUCOPHAGE) 500 MG tablet Take 0.5 tablets (250 mg total) by mouth daily with breakfast. 45 tablet 0  . oxyCODONE (OXY IR/ROXICODONE) 5 MG immediate release tablet Take 1 tablet (5 mg total) by mouth every 12 (twelve) hours as needed for severe pain. 10 tablet 0  . pantoprazole (PROTONIX) 40 MG tablet TAKE 1 TABLET(40 MG) BY MOUTH DAILY 30 tablet 11  . polyethylene glycol (MIRALAX / GLYCOLAX) 17 g packet Take 17 g by mouth daily as needed for moderate constipation or severe constipation. 30 each 0  . potassium chloride (KLOR-CON) 10 MEQ tablet TAKE 1 TABLET(10 MEQ) BY MOUTH DAILY 30 tablet 9  . sildenafil (VIAGRA) 50 MG tablet Take 1 tablet (50 mg total) by mouth as needed for erectile dysfunction. 30 tablet 4  . traZODone (DESYREL) 100 MG tablet Take 1 tablet (100 mg total) by mouth at bedtime as needed for sleep. 30 tablet 0  . metoprolol succinate (TOPROL-XL) 100 MG 24 hr tablet Take 1 tablet (100 mg total) by mouth daily. Take with or immediately following a meal. 90 tablet 3  . zolpidem (AMBIEN) 5 MG tablet Take 0.5 tablets (2.5 mg total) by mouth at bedtime. (Patient not taking: Reported on 03/04/2021) 30 tablet 0   No current facility-administered medications for this visit.    Allergies:   Patient has no known allergies.   Social History:  The patient  reports that he quit smoking about 7 years ago. His smoking use included cigarettes. He smoked 0.00 packs per day for 30.00 years. He has never used smokeless tobacco. He reports previous alcohol use. He reports that he does not use drugs.   Family History:  The patient's  family history includes Brain cancer in his father; Heart disease in his mother; Hypertension in his daughter.  ROS:  Please see the history of present illness.    All other systems are reviewed and otherwise negative.   PHYSICAL EXAM:  VS:  BP 112/88   Pulse 85   Ht '6\' 6"'  (1.981 m)   Wt 111.6 kg   SpO2 98%   BMI 28.43 kg/m  BMI: Body mass index is 28.43 kg/m. Well nourished, well developed, in no acute distress HEENT: normocephalic, atraumatic Neck: no JVD, carotid bruits or masses Cardiac:  RRR; no significant murmurs, no rubs, or gallops Lungs:  CTA b/l, no wheezing, rhonchi or rales Abd: soft, nontender MS: no deformity or atrophy Ext: no edema Skin: warm and dry, no rash Neuro:  No gross deficits appreciated Psych:  euthymic mood, full affect  ICD site is stable, no tethering or discomfort   EKG:  Not done today  Device interrogation done today and reviewed by myself:  Battery and lead measurements are good VT 01/08/21 3 episodes that day/that time One labeled VT (treated with ATP successfully),  one SVT and one NSVT Reviewed with DR. Caryl Comes The device mis-labeled SVT/NSVT that was actually VT, witheld based on discriminator that called ST  In review though the starting beat was a PVC > VT with retrograde P waves, morphology also is c/w his VT ST discriminator was turned off   [name] 09/24/2020  Ost RCA to Mid RCA lesion is 100% stenosed.  Previously placed 1st Mrg stent (unknown type) is widely patent.  Prox Cx lesion is 100% stenosed.  Previously placed 1st Diag stent (unknown type) is widely patent.  Previously placed Prox LAD to Mid LAD stent (unknown type) is widely patent.  Echo 09/24/2020 LVEF 40-45%, Grade 1 DD   06/08/2018: TTE Study Conclusions - Left ventricle: Systolic function was normal. The estimated  ejection fraction was in the range of 60% to 65%. Wall motion was  normal; there were no regional wall motion abnormalities.   Features are consistent with a pseudonormal left ventricular  filling pattern, with concomitant abnormal relaxation and  increased filling pressure (grade 2 diastolic dysfunction).  - Left atrium: The atrium was severely dilated.    Cardiac cath 05/05/2016  1. Ost RCA to Dist RCA lesion, 100% stenosed. Known occluded. RPDA fills via collaterals with minimal filling of PL system 2. Post Atrio lesion, 80% stenosed. 3. Lat 1st Mrg lesion, 100% stenosed. Appears to be jailed from prior stent. Fills via collaterals 4. Widely patent stent in OM1, D1 and mid LAD 5. There is mild to moderate left ventricular systolic dysfunction. 6. Severely elevated LVEDP  No new potential culprit lesion visualized to explain the patient's presentation with ventricular tachycardiac. There is a but appears to be chronically occluded branch of an OM as well as known occlusion of the RCA.  He has known moderately reduced EF with now severely elevated LVEDP.  Plan:  Return to TCU for continued care.  Consider diuresis for elevated LVEDP. This is in the setting of normal to low systemic pressures.  Continue to titrate antiarrhythmic medications.   Recent Labs: 09/10/2020: TSH 0.747 09/27/2020: Magnesium 1.8 11/15/2020: ALT 15; BUN 14; Creatinine, Ser 1.02; Hemoglobin 12.4; Platelets 265; Potassium 4.6; Sodium 138  No results found for requested labs within last 8760 hours.   CrCl cannot be calculated (Patient's most recent lab result is older than the maximum 21 days allowed.).   Wt Readings from Last 3 Encounters:  03/04/21 111.6 kg  11/15/20 117 kg  11/01/20 118.8 kg     Other studies reviewed: Additional studies/records reviewed today include: summarized above  ASSESSMENT AND PLAN:  1. ICD     Discussed above, programmed ST discriminator off that witheld therapy of VT with retrograde P waves  2. CAD     No anginal symptoms     on Plavix, statin, BB     Sees Dr. Loletha Grayer tomorrow  3.  Chronic CHF (systolic) 4. ICM     OptiVol is maxed out though no symptoms or exam findings of volume OL     Weight is stable     On BB, entresto, lasix/K+     He sees Dr. Loletha Grayer tomorrow  5. Paroxysmal Afib     CHA2DS2Vasc is 3, on Eliquis,  appropriately dosed     0%  6. VT     Back on amiodarone Oct 2021     LFTs Dec looked OK     After the pt left, noted we should have gotten TSH, he sees Dr. Loletha Grayer tomorrow, will have it ordered to do with any other labs he may want to do    Disposition: F/u with Dr. Sallyanne Kuster, continue remotes as usual and EP in clinic in a year, sooner if needed.  Current medicines are reviewed at length with the patient today.  The patient did not have any concerns regarding medicines.  Venetia Night, PA-C 03/04/2021 4:25 PM     [address] [place] [address] [phone] (office)  [phone] (fax)

## 2021-02-27 ENCOUNTER — Other Ambulatory Visit: Payer: Self-pay | Admitting: Student

## 2021-03-04 ENCOUNTER — Ambulatory Visit (INDEPENDENT_AMBULATORY_CARE_PROVIDER_SITE_OTHER): Payer: Worker's Compensation | Admitting: Physician Assistant

## 2021-03-04 ENCOUNTER — Encounter: Payer: Self-pay | Admitting: Physician Assistant

## 2021-03-04 ENCOUNTER — Other Ambulatory Visit: Payer: Self-pay

## 2021-03-04 VITALS — BP 112/88 | HR 85 | Ht 78.0 in | Wt 246.0 lb

## 2021-03-04 DIAGNOSIS — Z95810 Presence of automatic (implantable) cardiac defibrillator: Secondary | ICD-10-CM

## 2021-03-04 DIAGNOSIS — I472 Ventricular tachycardia, unspecified: Secondary | ICD-10-CM

## 2021-03-04 DIAGNOSIS — I5022 Chronic systolic (congestive) heart failure: Secondary | ICD-10-CM

## 2021-03-04 LAB — CUP PACEART INCLINIC DEVICE CHECK
Battery Remaining Longevity: 71 mo
Battery Voltage: 2.98 V
Brady Statistic AP VP Percent: 0.09 %
Brady Statistic AP VS Percent: 30.12 %
Brady Statistic AS VP Percent: 0.06 %
Brady Statistic AS VS Percent: 69.72 %
Brady Statistic RA Percent Paced: 29.37 %
Brady Statistic RV Percent Paced: 0.17 %
Date Time Interrogation Session: 20220321164204
HighPow Impedance: 41 Ohm
HighPow Impedance: 51 Ohm
Implantable Lead Implant Date: 20051120
Implantable Lead Implant Date: 20101117
Implantable Lead Location: 753859
Implantable Lead Location: 753860
Implantable Lead Model: 5076
Implantable Lead Model: 7121
Implantable Pulse Generator Implant Date: 20180105
Lead Channel Impedance Value: 304 Ohm
Lead Channel Impedance Value: 399 Ohm
Lead Channel Impedance Value: 399 Ohm
Lead Channel Pacing Threshold Amplitude: 0.625 V
Lead Channel Pacing Threshold Amplitude: 1 V
Lead Channel Pacing Threshold Pulse Width: 0.4 ms
Lead Channel Pacing Threshold Pulse Width: 0.4 ms
Lead Channel Sensing Intrinsic Amplitude: 14.5 mV
Lead Channel Sensing Intrinsic Amplitude: 15.875 mV
Lead Channel Sensing Intrinsic Amplitude: 3 mV
Lead Channel Sensing Intrinsic Amplitude: 3.5 mV
Lead Channel Setting Pacing Amplitude: 1.5 V
Lead Channel Setting Pacing Amplitude: 2 V
Lead Channel Setting Pacing Pulse Width: 0.4 ms
Lead Channel Setting Sensing Sensitivity: 0.6 mV

## 2021-03-04 NOTE — Patient Instructions (Signed)
Medication Instructions:   Your physician recommends that you continue on your current medications as directed. Please refer to the Current Medication list given to you today.  *If you need a refill on your cardiac medications before your next appointment, please call your pharmacy*   Lab Work: NONE ORDERED  TODAY   If you have labs (blood work) drawn today and your tests are completely normal, you will receive your results only by: . MyChart Message (if you have MyChart) OR . A paper copy in the mail If you have any lab test that is abnormal or we need to change your treatment, we will call you to review the results.   Testing/Procedures: NONE ORDERED  TODAY   Follow-Up: At [facility], you and your health needs are our priority.  As part of our continuing mission to provide you with exceptional heart care, we have created designated Provider Care Teams.  These Care Teams include your primary Cardiologist (physician) and Advanced Practice Providers (APPs -  Physician Assistants and Nurse Practitioners) who all work together to provide you with the care you need, when you need it.  We recommend signing up for the patient portal called "MyChart".  Sign up information is provided on this After Visit Summary.  MyChart is used to connect with patients for Virtual Visits (Telemedicine).  Patients are able to view lab/test results, encounter notes, upcoming appointments, etc.  Non-urgent messages can be sent to your provider as well.   To learn more about what you can do with MyChart, go to [email or web address].    Your next appointment:   1 year(s)  The format for your next appointment:   In Person  Provider:   Steven Klein, MD   Other Instructions   

## 2021-03-05 ENCOUNTER — Encounter: Payer: Self-pay | Admitting: Cardiovascular Disease

## 2021-03-05 ENCOUNTER — Ambulatory Visit (INDEPENDENT_AMBULATORY_CARE_PROVIDER_SITE_OTHER): Payer: Worker's Compensation | Admitting: Cardiovascular Disease

## 2021-03-05 ENCOUNTER — Telehealth: Payer: Self-pay | Admitting: Cardiovascular Disease

## 2021-03-05 VITALS — BP 128/84 | HR 71 | Ht 78.0 in | Wt 247.0 lb

## 2021-03-05 DIAGNOSIS — I2510 Atherosclerotic heart disease of native coronary artery without angina pectoris: Secondary | ICD-10-CM

## 2021-03-05 DIAGNOSIS — Z95810 Presence of automatic (implantable) cardiac defibrillator: Secondary | ICD-10-CM | POA: Diagnosis not present

## 2021-03-05 DIAGNOSIS — I472 Ventricular tachycardia, unspecified: Secondary | ICD-10-CM

## 2021-03-05 DIAGNOSIS — I10 Essential (primary) hypertension: Secondary | ICD-10-CM | POA: Diagnosis not present

## 2021-03-05 DIAGNOSIS — N529 Male erectile dysfunction, unspecified: Secondary | ICD-10-CM

## 2021-03-05 DIAGNOSIS — E7800 Pure hypercholesterolemia, unspecified: Secondary | ICD-10-CM

## 2021-03-05 DIAGNOSIS — Z79899 Other long term (current) drug therapy: Secondary | ICD-10-CM

## 2021-03-05 DIAGNOSIS — I5022 Chronic systolic (congestive) heart failure: Secondary | ICD-10-CM | POA: Diagnosis not present

## 2021-03-05 DIAGNOSIS — Z5181 Encounter for therapeutic drug level monitoring: Secondary | ICD-10-CM

## 2021-03-05 DIAGNOSIS — Z9989 Dependence on other enabling machines and devices: Secondary | ICD-10-CM

## 2021-03-05 DIAGNOSIS — G4733 Obstructive sleep apnea (adult) (pediatric): Secondary | ICD-10-CM

## 2021-03-05 MED ORDER — FUROSEMIDE 40 MG PO TABS: 40.0000 mg | ORAL_TABLET | Freq: Every day | ORAL | 5 refills | Status: DC

## 2021-03-05 MED ORDER — DAPAGLIFLOZIN PROPANEDIOL 10 MG PO TABS: 10.0000 mg | ORAL_TABLET | Freq: Every day | ORAL | 5 refills | Status: DC

## 2021-03-05 MED ORDER — EZETIMIBE 10 MG PO TABS: 10.0000 mg | ORAL_TABLET | Freq: Every day | ORAL | 3 refills | Status: DC

## 2021-03-05 NOTE — Telephone Encounter (Signed)
Will forward to Lyn V LPN to see about starting PA

## 2021-03-05 NOTE — Telephone Encounter (Signed)
New Message:     Pt said Dr C changed him to [place]. His insurance wants a prior authorization and a medical report on his condition and why he prescribed it.

## 2021-03-05 NOTE — Progress Notes (Signed)
Patient ID: Gregory [name], male   DOB: 1961/11/07, 60 y.o.   MRN: [id number]    Cardiology Office Note    Date:  03/12/2021   ID:  Johnrobert, [place] 19-Aug-1961, MRN [id number]  PCP:  Patient, [name]  Cardiologist:  Jolyn Nap, M.D.; Sanda Klein, MD   No chief complaint on file.   History of Present Illness:  Ares Yahir Tavano is a 60 y.o. 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  His defibrillator was initially implanted in 2006 with the most recent generator change in 2018 (Medtronic Evera XT DR).  In 2017 he was found to have slow VT which prompted reduction of the VT detection zone to 150 bpm.  He received appropriate antitachycardia pacing for ventricular tachycardia in January 2021.  He was hospitalized in late September 2021 with a picture suggesting sepsis and hypovolemia, accompanied by uninterrupted atrial fibrillation with rapid ventricular response for couple of days before that.  Amiodarone was initiated then he underwent an unsuccessful cardioversion on 09/13/2020.  Had some altered mental status, questionable stroke.  A second attempt at cardioversion, this time successfully on 09/19/2020 09/22/2020 he had monomorphic ventricular tachycardia for which she received therapy from his defibrillator.  He underwent cardiac catheterization on 09/24/2020 confirming the known total occlusion of the right coronary artery and showing that previously placed stents were widely patent.  Note difficulty in radial approach and it was recommended that future coronary  angio be performed from the femoral approach.  Filling pressures were normal and there was very mild systolic pulmonary hypertension.  Cardiac output was high.  Repeat echo on 09/22/2020 showed unchanged LVEF 40-45%.  Had a questionable drug rash.  Inpatient rehab from where he was discharged on 10/09/2020.  He was seen in the EP clinic by Tommye Standard NP the day before this appointment and was doing well.  He is getting stronger.  He denies problems with angina at rest or with activity.  He can tolerate walking on flat ground but gets short of breath after climbing one-story (14 steps). He is also going to the gym 5 days a week where he rides a stationary bike for 1 hour.  Activity level is on the average 1.1 hours/day per his ICD.  He has not had any more defibrillator discharges and denies dizziness or syncope.  He has not had edema, claudication or any new focal neurological complaints.  Glycemic control has been fair with a hemoglobin A1c of 6.9% and a most recent fasting blood sugar of 107.  His most recent LDL was slightly high at 85.  Presenting rhythm today is atrial paced, ventricular sensed, but he only paces the atrium 11.7% of the time and almost never requires ventricular pacing.  There is been a remarkable reduction in the overall burden of ventricular arrhythmia and he has not had any atrial fibrillation since his hospitalization last fall.  He is only had one episode of nonsustained VT so far this year.  Thoracic impedance is no abnormal, but note that it was abnormal even when he underwent his cardiac catheterization that showed a pulmonary wedge pressure of 7.  Seems to be settling back towards a new baseline.  In  May 2017 he had palpitations, visual changes and slight dizziness and subsequently presented with sustained monomorphic ventricular tachycardia below ICD detection rate. He underwent urgent cardioversion in the emergency department. He subsequently underwent right and left heart  catheterization showing no real change in coronary anatomy. A lateral branch of his first oblique marginal artery was subtotally occluded and there was high-grade disease in his PLA artery, neither one amenable to revascularization. His left ventricular end-diastolic pressure was however severely elevated. Amiodarone was initiated. VT detection rate was decreased to 150 bpm after hospitalization and subsequently decreased to 130 bpm when he returned with recurrent VT at his office appointment with Dr. Caryl Comes on June 27. Pre-shock antitachycardia pacing was made more aggressive. Mexiletine was stopped. Her remains on amiodarone at maintenance dose 200 mg daily, without side effects so far.   Past Medical History:  Diagnosis Date  . CAD (coronary artery disease) 12/01/2013  . Chronic combined systolic and diastolic CHF, NYHA class 1 ([place]) 12/01/2013  . Erectile dysfunction 12/01/2013  . HTN (hypertension) 12/01/2013  . Hyperlipidemia 12/01/2013  . Ischemic cardiomyopathy 12/01/2013  . Sleep apnea     Past Surgical History:  Procedure Laterality Date  . ACHILLES TENDON REPAIR     lft foot  . CARDIAC CATHETERIZATION N/A 05/05/2016   Procedure: Left Heart Cath and Coronary Angiography;  Surgeon: Leonie Man, MD;  Location: [place] [facility];  Service: Cardiovascular;  Laterality: N/A;  . CARDIAC DEFIBRILLATOR PLACEMENT    . CARDIOVERSION N/A 09/13/2020   Procedure: CARDIOVERSION;  Surgeon: Werner Lean, MD;  Location: [facility];  Service: Cardiovascular;  Laterality: N/A;  . CARDIOVERSION N/A 09/19/2020   Procedure: CARDIOVERSION;  Surgeon: [name], MD;  Location: [name] ENDOSCOPY;  Service: Cardiovascular;  Laterality: N/A;  . EP IMPLANTABLE DEVICE N/A 12/19/2016   Procedure: ICD Generator Changeout;  Surgeon: [name], MD;  Location: [place] [facility];  Service: Cardiovascular;  Laterality: N/A;  . RIGHT/LEFT HEART CATH AND CORONARY ANGIOGRAPHY N/A 09/24/2020    Procedure: RIGHT/LEFT HEART CATH AND CORONARY ANGIOGRAPHY;  Surgeon: Lorretta Harp, MD;  Location: [place] [facility];  Service: Cardiovascular;  Laterality: N/A;  . WRIST TENODESIS      Outpatient Medications Prior to Visit  Medication Sig Dispense Refill  . amiodarone (PACERONE) 200 MG tablet TAKE 1 TABLET BY MOUTH DAILY 30 tablet 11  . apixaban (ELIQUIS) 5 MG TABS tablet Take 1 tablet (5 mg total) by mouth 2 (two) times daily. Please put note on bag to call office for appointment 180 tablet 1  . atorvastatin (LIPITOR) 80 MG tablet TAKE ONE TABLET BY MOUTH EVERY EVENING 90 tablet 0  . clopidogrel (PLAVIX) 75 MG tablet TAKE 1 TABLET(75 MG) BY MOUTH DAILY 90 tablet 2  . docusate sodium (COLACE) 100 MG capsule Take 1 capsule (100 mg total) by mouth 2 (two) times daily. 10 capsule 0  . ELIQUIS 5 MG TABS tablet TAKE 1 TABLET(5 MG) BY MOUTH TWICE DAILY 60 tablet 0  . ENTRESTO 97-103 MG TAKE 1 TABLET BY MOUTH TWICE DAILY 60 tablet 10  . levETIRAcetam (KEPPRA) 500 MG tablet Take 1 tablet (500 mg total) by mouth 2 (two) times daily. 60 tablet 6  . melatonin 5 MG TABS Take 1 tablet (5 mg total) by mouth at bedtime. 30 tablet 0  . oxyCODONE (OXY IR/ROXICODONE) 5 MG immediate release tablet Take 1 tablet (5 mg total) by mouth every 12 (twelve) hours as needed for severe pain. 10  tablet 0  . pantoprazole (PROTONIX) 40 MG tablet TAKE 1 TABLET(40 MG) BY MOUTH DAILY 30 tablet 11  . polyethylene glycol (MIRALAX / GLYCOLAX) 17 g packet Take 17 g by mouth daily as needed for moderate constipation or severe constipation. 30 each 0  . potassium chloride (KLOR-CON) 10 MEQ tablet TAKE 1 TABLET(10 MEQ) BY MOUTH DAILY 30 tablet 9  . sildenafil (VIAGRA) 50 MG tablet Take 1 tablet (50 mg total) by mouth as needed for erectile dysfunction. 30 tablet 4  . traZODone (DESYREL) 100 MG tablet Take 1 tablet (100 mg total) by mouth at bedtime as needed for sleep. 30 tablet 0  . zolpidem (AMBIEN) 5 MG tablet Take 0.5 tablets  (2.5 mg total) by mouth at bedtime. 30 tablet 0  . furosemide (LASIX) 40 MG tablet Take 1 tablet (40 mg total) by mouth 2 (two) times daily. 180 tablet 3  . metFORMIN (GLUCOPHAGE) 500 MG tablet Take 0.5 tablets (250 mg total) by mouth daily with breakfast. 45 tablet 0  . metoprolol succinate (TOPROL-XL) 100 MG 24 hr tablet Take 1 tablet (100 mg total) by mouth daily. Take with or immediately following a meal. 90 tablet 3   No facility-administered medications prior to visit.     Allergies:   Patient has no known allergies.   Social History   Socioeconomic History  . Marital status: Single    Spouse name: Not on file  . Number of children: Not on file  . Years of education: Not on file  . Highest education level: Not on file  Occupational History  . Not on file  Tobacco Use  . Smoking status: Former Smoker    Packs/day: 0.00    Years: 30.00    Pack years: 0.00    Types: Cigarettes    Quit date: 05/31/2013    Years since quitting: 7.7  . Smokeless tobacco: Never Used  Vaping Use  . Vaping Use: Never used  Substance and Sexual Activity  . Alcohol use: Not Currently    Comment: ocassionally  . Drug use: Never  . Sexual activity: Not Currently  Other Topics Concern  . Not on file  Social History Narrative   Pt lies in split level home with his son, daughter and grand-son   Has 3 children   Some college education   Retired Scientist, forensic.    Right handed   Social Determinants of Health   Financial Resource Strain: Not on file  Food Insecurity: Not on file  Transportation Needs: Not on file  Physical Activity: Not on file  Stress: Not on file  Social Connections: Not on file     Family History:  The patient's family history includes Brain cancer in his father; Heart disease in his mother; Hypertension in his daughter.   ROS:   Please see the history of present illness.    ROS All other systems reviewed and are negative.   PHYSICAL EXAM:   VS:  BP  128/84   Pulse 71   Ht 6\' 6"  (1.981 m)   Wt 247 lb (112 kg)   SpO2 99%   BMI 28.54 kg/m     General: Alert, oriented x3, no distress, healthy ICD site.  Appears weaker than in the past.  Mildly overweight. Head: no evidence of trauma, PERRL, EOMI, no exophtalmos or lid lag, no myxedema, no xanthelasma; normal ears, nose and oropharynx Neck: normal jugular venous pulsations and no hepatojugular reflux; brisk carotid pulses without delay and  no carotid bruits Chest: clear to auscultation, no signs of consolidation by percussion or palpation, normal fremitus, symmetrical and full respiratory excursions Cardiovascular: normal position and quality of the apical impulse, regular rhythm, normal first and second heart sounds, no murmurs, rubs or gallops Abdomen: no tenderness or distention, no masses by palpation, no abnormal pulsatility or arterial bruits, normal bowel sounds, no hepatosplenomegaly Extremities: no clubbing, cyanosis or edema; 2+ radial, ulnar and brachial pulses bilaterally; 2+ right femoral, posterior tibial and dorsalis pedis pulses; 2+ left femoral, posterior tibial and dorsalis pedis pulses; no subclavian or femoral bruits Neurological: grossly nonfocal Psych: Normal mood and affect   Wt Readings from Last 3 Encounters:  03/05/21 247 lb (112 kg)  03/04/21 246 lb (111.6 kg)  11/15/20 258 lb (117 kg)      Studies/Labs Reviewed:   [facility] 09/24/2020  Ost RCA to Mid RCA lesion is 100% stenosed.  Previously placed 1st Mrg stent (unknown type) is widely patent.  Prox Cx lesion is 100% stenosed.  Previously placed 1st Diag stent (unknown type) is widely patent.  Previously placed Prox LAD to Mid LAD stent (unknown type) is widely patent.  Echo 09/24/2020 LVEF 40-45%, Grade 1 DD EKG:  EKG is ordered today it shows atrial paced, ventricular sensed rhythm with occasional PVCs.  He has a broad nonspecific intraventricular conduction delay with a QRS of 128 ms and inferior  Q waves, QTC 482 ms (on amiodarone) Recent Labs: 09/10/2020: TSH 0.747 09/27/2020: Magnesium 1.8 11/15/2020: ALT 15; BUN 14; Creatinine, Ser 1.02; Hemoglobin 12.4; Platelets 265; Potassium 4.6; Sodium 138   Lipid Panel    Component Value Date/Time   CHOL 137 12/30/2019 1242   TRIG 78 12/30/2019 1242   HDL 37 (L) 12/30/2019 1242   CHOLHDL 3.7 12/30/2019 1242   CHOLHDL 4.2 05/05/2016 0526   VLDL 11 05/05/2016 0526   LDLCALC 85 12/30/2019 1242     ASSESSMENT:    1. Chronic systolic congestive heart failure ([place])   2. Ventricular tachycardia ([place])   3. Encounter for monitoring amiodarone therapy   4. ICD (implantable cardioverter-defibrillator) in place   5. Coronary artery disease involving native coronary artery of native heart without angina pectoris   6. Essential hypertension   7. Hypercholesterolemia   8. Erectile dysfunction, unspecified erectile dysfunction type   9. OSA on CPAP      PLAN:  In order of problems listed above:  1. CHF: NYHA functional class II.  Clinically euvolemic.  Thoracic impedance from his device does not correlate with clinical status, at least not in the last few months.  He is requiring a moderate dose of loop diuretic.  He is on maximum dose Entresto and on a good dose of metoprolol.  We will add [place].  Discussed the risk of yeast infections and perineal gangrene.  This will help both with glycemic control and especially with heart failure.  When he starts the [place] will reduce the dose of furosemide to avoid hypovolemia.  May be a while to we can get approval for Wilder Glade from his insurance company. 2. VT: he has a large inferolateral scar which is probably the source of VT. he had repeated episodes of VT last fall when he also had atrial fibrillation and a hypotensive illness of unclear etiology.  Now on amiodarone, remarkably stabilized. 3. Amiodarone: Now on maintenance dose amiodarone 200 mg daily.  Needs liver function test, thyroid function  tests every 6 months, yearly eye exam, prompt reporting of any unexplained respiratory  symptoms. 4. ICD: Mariel Kansky function, followed by Dr. Caryl Comes. 5. CAD: Angina free.  Recent cardiac catheterization shows known chronic occlusion of the right coronary artery but other major conduits and previously placed stents widely patent. 6. HTN: Excellent control.  Amlodipine recently stopped. 7. HLP: Add Zetia to maximum atorvastatin dose.  Target LDL less than 70. 8. DM: controlled.  We will probably decide to stop the Metformin once he is on Farxiga.  He has lost substantial weight already. 9. ED: Good response to PDE 5 inhibitors, he knows that he should never mix these with nitrates. 10. OSA: I encouraged 100% compliance with CPAP.    Medication Adjustments/Labs and Tests Ordered: Current medicines are reviewed at length with the patient today.  Concerns regarding medicines are outlined above.  Medication changes, Labs and Tests ordered today are listed in the Patient Instructions below. Patient Instructions  Medication Instructions:  START (Ezetimibe) Zetia 10 mg once daily START Farxiga 10 mg once daily.  Furosemide: When you start the Farxiga decrease the Furosemide to 40 mg once daily  *If you need a refill on your cardiac medications before your next appointment, please call your pharmacy*   Lab Work: Your provider would like for you to return in 3 months before your next appointment to have the following labs drawn: A1C, fasting Lipid, TSH and CMET. You do not need an appointment for the lab. Once in our office lobby there is a podium where you can sign in and ring the doorbell to alert [place] that you are here. The lab is open from 8:00 am to 4:30 pm; closed for lunch from 12:45pm-1:45pm.  If you have labs (blood work) drawn today and your tests are completely normal, you will receive your results only by: Marland Kitchen MyChart Message (if you have MyChart) OR . A paper copy in the mail If you have any lab  test that is abnormal or we need to change your treatment, we will call you to review the results.   Testing/Procedures: None ordered   Follow-Up: At [facility], you and your health needs are our priority.  As part of our continuing mission to provide you with exceptional heart care, we have created designated Provider Care Teams.  These Care Teams include your primary Cardiologist (physician) and Advanced Practice Providers (APPs -  Physician Assistants and Nurse Practitioners) who all work together to provide you with the care you need, when you need it.  We recommend signing up for the patient portal called "MyChart".  Sign up information is provided on this After Visit Summary.  MyChart is used to connect with patients for Virtual Visits (Telemedicine).  Patients are able to view lab/test results, encounter notes, upcoming appointments, etc.  Non-urgent messages can be sent to your provider as well.   To learn more about what you can do with MyChart, go to [email or web address].    Your next appointment:   3 month(s)  The format for your next appointment:   In Person  Provider:   Sanda Klein, MD        Signed, Sanda Klein, MD  03/12/2021 5:54 PM    [address], [place], [place]  [address] Phone: [phone]; Fax: [phone]

## 2021-03-05 NOTE — Patient Instructions (Signed)
Medication Instructions:  START (Ezetimibe) Zetia 10 mg once daily START Farxiga 10 mg once daily.  Furosemide: When you start the Farxiga decrease the Furosemide to 40 mg once daily  *If you need a refill on your cardiac medications before your next appointment, please call your pharmacy*   Lab Work: Your provider would like for you to return in 3 months before your next appointment to have the following labs drawn: A1C, fasting Lipid, TSH and CMET. You do not need an appointment for the lab. Once in our office lobby there is a podium where you can sign in and ring the doorbell to alert [place] that you are here. The lab is open from 8:00 am to 4:30 pm; closed for lunch from 12:45pm-1:45pm.  If you have labs (blood work) drawn today and your tests are completely normal, you will receive your results only by: Marland Kitchen MyChart Message (if you have MyChart) OR . A paper copy in the mail If you have any lab test that is abnormal or we need to change your treatment, we will call you to review the results.   Testing/Procedures: None ordered   Follow-Up: At [facility], you and your health needs are our priority.  As part of our continuing mission to provide you with exceptional heart care, we have created designated Provider Care Teams.  These Care Teams include your primary Cardiologist (physician) and Advanced Practice Providers (APPs -  Physician Assistants and Nurse Practitioners) who all work together to provide you with the care you need, when you need it.  We recommend signing up for the patient portal called "MyChart".  Sign up information is provided on this After Visit Summary.  MyChart is used to connect with patients for Virtual Visits (Telemedicine).  Patients are able to view lab/test results, encounter notes, upcoming appointments, etc.  Non-urgent messages can be sent to your provider as well.   To learn more about what you can do with MyChart, go to [email or web address].    Your next  appointment:   3 month(s)  The format for your next appointment:   In Person  Provider:   Sanda Klein, MD

## 2021-03-05 NOTE — Progress Notes (Signed)
Ordered CMET and TSH. Thanks

## 2021-03-06 MED ORDER — METFORMIN HCL 500 MG PO TABS: 250.0000 mg | ORAL_TABLET | Freq: Every day | ORAL | 0 refills | Status: DC

## 2021-03-06 NOTE — Telephone Encounter (Signed)
Wilder Glade PA started through covermymeds. Key: [id number]

## 2021-03-08 ENCOUNTER — Telehealth: Payer: Self-pay | Admitting: *Deleted

## 2021-03-08 NOTE — Telephone Encounter (Signed)
Called Gregory [name] with Workers Comp per the patient request to discuss the need for [place]. She stated that they had forms that needed to be filled out. She will fax them to the office.

## 2021-03-11 NOTE — Telephone Encounter (Signed)
Medication request has been sent to [employer] for [place].

## 2021-03-14 NOTE — Telephone Encounter (Signed)
**Note De-Identified  Obfuscation** Wendie Chess (Key: [id number]) Status: Sent to Plan on March 23 Next Steps: To follow up, call the plan at [phone]  Drug: Wilder Glade 10MG  tablets Form: [facility] Wilder Glade Form Prior Authorization Form for Farxiga [phone]  I called [employer] at the phone number provided and s/w Cecille Rubin who advised me that they are the benefit manager for many plans and that she has checked all and cannot find the pt in any systems with them.  Lori recommended that I call the pts pharmacy to get information needed to do a prior authorization.  I called [facility] and was advised that they do not have any workers comp coverage for the pt on file (they state the ID # I was provided is for a workers comp claim) and that they will reach out to the pt to ask that he provide that information to them. They are aware to call Jeani Hawking at [phone] if they need anything from [place] concerning this [place] PA.

## 2021-03-20 ENCOUNTER — Other Ambulatory Visit: Payer: Self-pay

## 2021-03-20 ENCOUNTER — Ambulatory Visit (INDEPENDENT_AMBULATORY_CARE_PROVIDER_SITE_OTHER): Payer: No Typology Code available for payment source | Admitting: Neurology

## 2021-03-20 ENCOUNTER — Encounter: Payer: Self-pay | Admitting: Neurology

## 2021-03-20 ENCOUNTER — Telehealth: Payer: Self-pay

## 2021-03-20 VITALS — BP 113/74 | HR 81 | Ht 78.0 in | Wt 243.6 lb

## 2021-03-20 DIAGNOSIS — G40009 Localization-related (focal) (partial) idiopathic epilepsy and epileptic syndromes with seizures of localized onset, not intractable, without status epilepticus: Secondary | ICD-10-CM

## 2021-03-20 DIAGNOSIS — G4700 Insomnia, unspecified: Secondary | ICD-10-CM

## 2021-03-20 MED ORDER — LEVETIRACETAM 500 MG PO TABS: 500.0000 mg | ORAL_TABLET | Freq: Two times a day (BID) | ORAL | 3 refills | Status: DC

## 2021-03-20 MED ORDER — TRAZODONE HCL 100 MG PO TABS: 100.0000 mg | ORAL_TABLET | Freq: Every day | ORAL | 3 refills | Status: DC

## 2021-03-20 NOTE — Patient Instructions (Signed)
1. Continue Keppra 500mg  twice a day  2. Refills for Trazodone 100mg  every night have been sent to your pharmacy as well  3. Follow-up in 1 year, call for any changes   Seizure Precautions: 1. If medication has been prescribed for you to prevent seizures, take it exactly as directed.  Do not stop taking the medicine without talking to your doctor first, even if you have not had a seizure in a long time.   2. Avoid activities in which a seizure would cause danger to yourself or to others.  Don't operate dangerous machinery, swim alone, or climb in high or dangerous places, such as on ladders, roofs, or girders.  Do not drive unless your doctor says you may.  3. If you have any warning that you may have a seizure, lay down in a safe place where you can't hurt yourself.    4.  No driving for 6 months from last seizure, as per [facility].   Please refer to the following link on the [place] website for more information: http://[email or web address].org/[email or web address]/driving/drivingu.cfm   5.  Maintain good sleep hygiene. Avoid alcohol.  6.  Contact your doctor if you have any problems that may be related to the medicine you are taking.  7.  Call 911 and bring the patient back to the ED if:        A.  The seizure lasts longer than 5 minutes.       B.  The patient doesn't awaken shortly after the seizure  C.  The patient has new problems such as difficulty seeing, speaking or moving  D.  The patient was injured during the seizure  E.  The patient has a temperature over 102 F (39C)  F.  The patient vomited and now is having trouble breathing

## 2021-03-20 NOTE — Telephone Encounter (Signed)
Patient referred to [facility] clinic by Dr Caryl Comes and Dr Sallyanne Kuster.  Attempted ICM intro call and left number for return call.

## 2021-03-20 NOTE — Progress Notes (Signed)
NEUROLOGY FOLLOW UP OFFICE NOTE  Gregory [name] [id number] May 29, 1961  HISTORY OF PRESENT ILLNESS: I had the pleasure of seeing Gregory [name] in follow-up in the neurology clinic on 03/20/2021.  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 He is on Levetiracetam 500mg  twice a day. He denies any seizures since October 2019 (electrographic seizures on EEG). His last GTC was in June 2019. He lives with his son. He denies any staring/unresponsive episodes, gaps in time, olfactory/gustatory hallucinations, focal numbness/tingling/weakness, myoclonic jerks. No headaches, dizziness, vision changes. He does not have a PCP and has not been getting Trazodone, so sleep has been horrible. Melatonin does not help. He was admitted in 08/2020 for atrial fibrillation in [facility]. During his stay, he had acute confusion and leukocytosis felt due to UTI/sepsis, encephalopathy resolved. He recalls one fall soon after hospital discharge. Mood is fine.   History on Initial Assessment 09/20/2018: This is a 60 year old right-handed man with a history of iron deficiency anemia, hypertension, CHF, CAD, s/p ICD placement, presenting after 2 episodes of unresponsiveness last 06/07/18. He reports that he had been lowering BP medication due to low BP and was feeling tired and dizzy. His last recollection was sitting in the car with his friend, then has no further recollection of events until he was in the ER. Notes reviewed, his friend noted he was staring straight ahead, unresponsive, then "he started talking like he was on a game show." He was back to baseline on arrival to the ER. While in the ER, nursing staff witnessed him to be staring off into space for around 20 seconds then had generalized tonic-clonic seizure-like activity that lasted for about a minute. He had slow sonorous breathing after and was then confused, gradually coming around. It was reported that he was hypotensive on the scene.  BP on arrival to the ER was 103/62. He was significantly anemic, Hgb 6.9. I personally reviewed head CT without contrast which did not show any acute changes, there was mild chronic microvascular disease. His wake and drowsy EEG was normal. Echocardiogram showed an EF of 65-46%, grade 2 diastolic dysfunction, left atrium severely dilated. Since this was the first event, and possibly provoked (hypoperfusion), seizure medication was not started. He received a blood transfusion and states that he feels "perfect" since then. He and his son deny any other staring/unresponsive episodes, gaps in time, olfactory/gustatory hallucinations, deja vu, rising epigastric sensation, focal numbness/tingling/weakness, myoclonic jerks.He denies any headaches, diplopia, dysarthria/dysphagia, neck/back pain, bowel/bladder dysfunction. He had a normal birth and early development.  There is no history of febrile convulsions, CNS infections such as meningitis/encephalitis, significant traumatic brain injury, neurosurgical procedures, or family history of seizures.  Diagnostic Data: 24-hour EEG done October 2019 was abnormal with occasional focal slowing over the right frontotemporal region. There were 2 electrographic seizures from the right temporal region lasting 50-150 seconds. CT head no acute changes   PAST MEDICAL HISTORY: Past Medical History:  Diagnosis Date  . CAD (coronary artery disease) 12/01/2013  . Chronic combined systolic and diastolic CHF, NYHA class 1 ([place]) 12/01/2013  . Erectile dysfunction 12/01/2013  . HTN (hypertension) 12/01/2013  . Hyperlipidemia 12/01/2013  . Ischemic cardiomyopathy 12/01/2013  . Sleep apnea     MEDICATIONS: Current Outpatient Medications on File Prior to Visit  Medication Sig Dispense Refill  . amiodarone (PACERONE) 200 MG tablet TAKE 1 TABLET BY MOUTH DAILY 30 tablet 11  . apixaban (ELIQUIS) 5 MG TABS tablet Take  1 tablet (5 mg total) by mouth 2 (two) times daily. Please put  note on bag to call office for appointment 180 tablet 1  . atorvastatin (LIPITOR) 80 MG tablet TAKE ONE TABLET BY MOUTH EVERY EVENING 90 tablet 0  . clopidogrel (PLAVIX) 75 MG tablet TAKE 1 TABLET(75 MG) BY MOUTH DAILY 90 tablet 2  . docusate sodium (COLACE) 100 MG capsule Take 1 capsule (100 mg total) by mouth 2 (two) times daily. 10 capsule 0  . ENTRESTO 97-103 MG TAKE 1 TABLET BY MOUTH TWICE DAILY 60 tablet 10  . ezetimibe (ZETIA) 10 MG tablet Take 1 tablet (10 mg total) by mouth daily. 90 tablet 3  . furosemide (LASIX) 40 MG tablet Take 1 tablet (40 mg total) by mouth daily. 30 tablet 5  . levETIRAcetam (KEPPRA) 500 MG tablet Take 1 tablet (500 mg total) by mouth 2 (two) times daily. 60 tablet 6  . melatonin 5 MG TABS Take 1 tablet (5 mg total) by mouth at bedtime. 30 tablet 0  . metFORMIN (GLUCOPHAGE) 500 MG tablet Take 0.5 tablets (250 mg total) by mouth daily with breakfast. 15 tablet 0  . metoprolol succinate (TOPROL-XL) 100 MG 24 hr tablet Take 1 tablet (100 mg total) by mouth daily. Take with or immediately following a meal. 90 tablet 3  . oxyCODONE (OXY IR/ROXICODONE) 5 MG immediate release tablet Take 1 tablet (5 mg total) by mouth every 12 (twelve) hours as needed for severe pain. 10 tablet 0  . polyethylene glycol (MIRALAX / GLYCOLAX) 17 g packet Take 17 g by mouth daily as needed for moderate constipation or severe constipation. 30 each 0  . potassium chloride (KLOR-CON) 10 MEQ tablet TAKE 1 TABLET(10 MEQ) BY MOUTH DAILY 30 tablet 9  . sildenafil (VIAGRA) 50 MG tablet Take 1 tablet (50 mg total) by mouth as needed for erectile dysfunction. 30 tablet 4  . traZODone (DESYREL) 100 MG tablet Take 1 tablet (100 mg total) by mouth at bedtime as needed for sleep. 30 tablet 0  . dapagliflozin propanediol (FARXIGA) 10 MG TABS tablet Take 1 tablet (10 mg total) by mouth daily before breakfast. (Patient not taking: Reported on 03/20/2021) 30 tablet 5  . pantoprazole (PROTONIX) 40 MG tablet TAKE 1  TABLET(40 MG) BY MOUTH DAILY 30 tablet 11  . zolpidem (AMBIEN) 5 MG tablet Take 0.5 tablets (2.5 mg total) by mouth at bedtime. (Patient not taking: Reported on 03/20/2021) 30 tablet 0   No current facility-administered medications on file prior to visit.    ALLERGIES: No Known Allergies  FAMILY HISTORY: Family History  Problem Relation Age of Onset  . Heart disease Mother   . Brain cancer Father   . Hypertension Daughter     SOCIAL HISTORY: Social History   Socioeconomic History  . Marital status: Single    Spouse name: Not on file  . Number of children: Not on file  . Years of education: Not on file  . Highest education level: Not on file  Occupational History  . Not on file  Tobacco Use  . Smoking status: Former Smoker    Packs/day: 0.00    Years: 30.00    Pack years: 0.00    Types: Cigarettes    Quit date: 05/31/2013    Years since quitting: 7.8  . Smokeless tobacco: Never Used  Vaping Use  . Vaping Use: Never used  Substance and Sexual Activity  . Alcohol use: Not Currently    Comment: ocassionally  . Drug  use: Never  . Sexual activity: Not Currently  Other Topics Concern  . Not on file  Social History Narrative   Pt lies in split level home with his son, daughter and grand-son   Has 3 children   Some college education   Retired Scientist, forensic.    Right handed   Social Determinants of Health   Financial Resource Strain: Not on file  Food Insecurity: Not on file  Transportation Needs: Not on file  Physical Activity: Not on file  Stress: Not on file  Social Connections: Not on file  Intimate Partner Violence: Not on file     PHYSICAL EXAM: Vitals:   03/20/21 0959  BP: 113/74  Pulse: 81  SpO2: 99%   General: No acute distress Head:  Normocephalic/atraumatic, pterygium in lateral sides of both eyes Skin/Extremities: No rash, no edema Neurological Exam: alert and awake. No aphasia or dysarthria. Fund of knowledge is appropriate.   Recent and remote memory are intact.  Attention and concentration are normal.   Cranial nerves: Pupils equal, round. Extraocular movements intact with no nystagmus. Visual fields full.  No facial asymmetry.  Motor: Bulk and tone normal, muscle strength 5/5 throughout with no pronator drift. Reflexes +1 throughout. Finger to nose testing intact.  Gait narrow-based and steady, mild difficulty with tandem walk but able.  Romberg negative.   IMPRESSION: This is a pleasant 60 yo RH man with a history of  iron deficiency anemia, hypertension, CHF, CAD, s/p ICD placement,atrial fibrillation, who had 2 episodes of unresponsiveness with staring last 06/07/18, the second episode witnessed in the ER was followed by convulsive activity. His 24-hour EEG in October 2019 captured 2 electrographic seizures arising from the right temporal region. He denies any seizures since 09/2018 on Levetiracetam 500mg  BID. He reports poor sleep since being unable to obtain Trazodone, refills sent for Trazodone 100mg  qhs. We discussed the importance of good sleep hygiene in relation to seizures. He is aware of [place] driving laws to stop driving after a seizure until 6 months seizure-free. Follow-up in 1 year, call for any changes.   Thank you for allowing me to participate in his care.  Please do not hesitate to call for any questions or concerns.   Ellouise Newer, M.D.

## 2021-04-02 ENCOUNTER — Other Ambulatory Visit: Payer: Self-pay | Admitting: Cardiovascular Disease

## 2021-04-05 NOTE — Telephone Encounter (Signed)
Faxed received that the [facility] has been approved.

## 2021-04-16 ENCOUNTER — Telehealth: Payer: Self-pay | Admitting: Cardiovascular Disease

## 2021-04-16 MED ORDER — ATORVASTATIN CALCIUM 80 MG PO TABS: 1.0000 | ORAL_TABLET | Freq: Every evening | ORAL | 3 refills | Status: DC

## 2021-04-16 NOTE — Telephone Encounter (Signed)
Rx(s) sent to pharmacy electronically.  

## 2021-04-16 NOTE — Telephone Encounter (Signed)
*  STAT* If patient is at the pharmacy, call can be transferred to refill team.   1. Which medications need to be refilled? (please list name of each medication and dose if known) atorvastatin (LIPITOR) 80 MG tablet  2. Which pharmacy/location (including street and city if local pharmacy) is medication to be sent to? [place] [address], [place]  3. Do they need a 30 day or 90 day supply? 90p

## 2021-04-18 ENCOUNTER — Ambulatory Visit (INDEPENDENT_AMBULATORY_CARE_PROVIDER_SITE_OTHER): Payer: Worker's Compensation

## 2021-04-18 DIAGNOSIS — I255 Ischemic cardiomyopathy: Secondary | ICD-10-CM

## 2021-04-18 LAB — CUP PACEART REMOTE DEVICE CHECK
Battery Remaining Longevity: 65 mo
Battery Voltage: 2.99 V
Brady Statistic AP VP Percent: 0.11 %
Brady Statistic AP VS Percent: 22.07 %
Brady Statistic AS VP Percent: 0.1 %
Brady Statistic AS VS Percent: 77.72 %
Brady Statistic RA Percent Paced: 22.13 %
Brady Statistic RV Percent Paced: 0.22 %
Date Time Interrogation Session: 20220505022724
HighPow Impedance: 34 Ohm
HighPow Impedance: 44 Ohm
Implantable Lead Implant Date: 20051120
Implantable Lead Implant Date: 20101117
Implantable Lead Location: 753859
Implantable Lead Location: 753860
Implantable Lead Model: 5076
Implantable Lead Model: 7121
Implantable Pulse Generator Implant Date: 20180105
Lead Channel Impedance Value: 285 Ohm
Lead Channel Impedance Value: 342 Ohm
Lead Channel Impedance Value: 361 Ohm
Lead Channel Pacing Threshold Amplitude: 0.625 V
Lead Channel Pacing Threshold Amplitude: 1 V
Lead Channel Pacing Threshold Pulse Width: 0.4 ms
Lead Channel Pacing Threshold Pulse Width: 0.4 ms
Lead Channel Sensing Intrinsic Amplitude: 13.25 mV
Lead Channel Sensing Intrinsic Amplitude: 13.25 mV
Lead Channel Sensing Intrinsic Amplitude: 3 mV
Lead Channel Sensing Intrinsic Amplitude: 3 mV
Lead Channel Setting Pacing Amplitude: 1.5 V
Lead Channel Setting Pacing Amplitude: 2 V
Lead Channel Setting Pacing Pulse Width: 0.4 ms
Lead Channel Setting Sensing Sensitivity: 0.6 mV

## 2021-04-23 NOTE — Telephone Encounter (Signed)
Spoke with patient and agreeable to monthly follow up.  Advised monitor should be by bedside in order for it to automatically transmit a report during sleep hours of 12 midnight and 6 AM.  Patient confirmed monitor is at bedside. Advised will receive a call after the transmission is reviewed to provide results.  Provided ICM number and explained should call if experiencing any fluid symptoms such as weight gain, shortness of breath or extremity/abdominal swelling. 1st ICM Remote Transmission scheduled for 06/03/2021.

## 2021-04-23 NOTE — Telephone Encounter (Signed)
Attempted ICM intro call and left message to return call.  

## 2021-04-29 ENCOUNTER — Telehealth: Payer: Self-pay | Admitting: Emergency Medicine

## 2021-04-29 NOTE — Telephone Encounter (Signed)
LMOM to call Device Clinic and DC # and hours provided. Last transmission showed elevated Optivol and decreased thoracic impedance. Transmission reviewed by Dr Caryl Comes and he requests patient be scheduled with EP APP to evaluate for CHF.

## 2021-04-29 NOTE — Telephone Encounter (Signed)
Called patient and advised. Apt made 05/07/21 @ 9:20 w/ A. Tillery, PA. Discussed location, date and time with patient.

## 2021-04-29 NOTE — Telephone Encounter (Signed)
Return nurse call. I let him speak with Leigh, rn.

## 2021-05-02 ENCOUNTER — Encounter: Payer: Worker's Compensation | Admitting: Physical Medicine & Rehabilitation

## 2021-05-07 ENCOUNTER — Encounter: Payer: No Typology Code available for payment source | Admitting: Student

## 2021-05-08 NOTE — Progress Notes (Signed)
Electrophysiology Office Note Date: 05/09/2021  ID:  Gregory [name], Gregory [name] [birth year], MRN [id number]  PCP: Patient, [name] Primary Cardiologist: Sanda Klein, MD Electrophysiologist: Virl Axe, MD   CC: Routine ICD follow-up  Gregory [name] is a 60 y.o. male seen today for Virl Axe, MD for acute visit due to elevated optivol.  Since last being seen in our clinic the patient reports doing well overall. He denies any undue SOB. He has mild lightheadedness with rapid standing, but no syncope or near syncope. He gets slightly winded after carrying the groceries up the steps, but this is stable chronically.  he denies chest pain, palpitations, dyspnea, PND, orthopnea, nausea, vomiting, dizziness, syncope, edema, weight gain, or early satiety. He has not had ICD shocks.   Device History: Medtronic Dual Chamber ICD implanted 2005, 2010 and gen change for 2018 History of AAD therapy: Yes; currently on amiodarone   Past Medical History:  Diagnosis Date  . CAD (coronary artery disease) 12/01/2013  . Chronic combined systolic and diastolic CHF, NYHA class 1 ([place]) 12/01/2013  . Erectile dysfunction 12/01/2013  . HTN (hypertension) 12/01/2013  . Hyperlipidemia 12/01/2013  . Ischemic cardiomyopathy 12/01/2013  . Sleep apnea    Past Surgical History:  Procedure Laterality Date  . ACHILLES TENDON REPAIR     lft foot  . CARDIAC CATHETERIZATION N/A 05/05/2016   Procedure: Left Heart Cath and Coronary Angiography;  Surgeon: Leonie Man, MD;  Location: [name];  Service: Cardiovascular;  Laterality: N/A;  . CARDIAC DEFIBRILLATOR PLACEMENT    . CARDIOVERSION N/A 09/13/2020   Procedure: CARDIOVERSION;  Surgeon: Werner Lean, MD;  Location[facility];  Service: Cardiovascular;  Laterality: N/A;  . CARDIOVERSION N/A 09/19/2020   Procedure: CARDIOVERSION;  Surgeon: [name], MD;  Location: [facility] ENDOSCOPY;  Service:  Cardiovascular;  Laterality: N/A;  . EP IMPLANTABLE DEVICE N/A 12/19/2016   Procedure: ICD Generator Changeout;  Surgeon: [name], MD;  Location: [place] [facility];  Service: Cardiovascular;  Laterality: N/A;  . RIGHT/LEFT HEART CATH AND CORONARY ANGIOGRAPHY N/A 09/24/2020   Procedure: RIGHT/LEFT HEART CATH AND CORONARY ANGIOGRAPHY;  Surgeon: Lorretta Harp, MD;  Location: [place] [facility];  Service: Cardiovascular;  Laterality: N/A;  . WRIST TENODESIS      Current Outpatient Medications  Medication Sig Dispense Refill  . amiodarone (PACERONE) 200 MG tablet TAKE 1 TABLET BY MOUTH DAILY 30 tablet 11  . apixaban (ELIQUIS) 5 MG TABS tablet Take 1 tablet (5 mg total) by mouth 2 (two) times daily. Please put note on bag to call office for appointment 180 tablet 1  . atorvastatin (LIPITOR) 80 MG tablet Take 1 tablet (80 mg total) by mouth every evening. 90 tablet 3  . clopidogrel (PLAVIX) 75 MG tablet TAKE 1 TABLET(75 MG) BY MOUTH DAILY 90 tablet 2  . dapagliflozin propanediol (FARXIGA) 10 MG TABS tablet Take 1 tablet (10 mg total) by mouth daily before breakfast. 30 tablet 5  . ENTRESTO 97-103 MG TAKE 1 TABLET BY MOUTH TWICE DAILY 60 tablet 10  . ezetimibe (ZETIA) 10 MG tablet Take 1 tablet (10 mg total) by mouth daily. 90 tablet 3  . furosemide (LASIX) 40 MG tablet Take 1 tablet (40 mg total) by mouth daily. 30 tablet 5  . levETIRAcetam (KEPPRA) 500 MG tablet Take 1 tablet (500 mg total) by mouth 2 (two) times daily. 180 tablet 3  . melatonin 5 MG TABS Take 1 tablet (5  mg total) by mouth at bedtime. 30 tablet 0  . metoprolol succinate (TOPROL-XL) 100 MG 24 hr tablet Take 1 tablet (100 mg total) by mouth daily. Take with or immediately following a meal. 90 tablet 3  . oxyCODONE (OXY IR/ROXICODONE) 5 MG immediate release tablet Take 1 tablet (5 mg total) by mouth every 12 (twelve) hours as needed for severe pain. 10 tablet 0  . pantoprazole (PROTONIX) 40 MG tablet TAKE 1 TABLET(40 MG) BY  MOUTH DAILY 30 tablet 11  . potassium chloride (KLOR-CON) 10 MEQ tablet TAKE 1 TABLET(10 MEQ) BY MOUTH DAILY 30 tablet 9  . sildenafil (VIAGRA) 50 MG tablet Take 1 tablet (50 mg total) by mouth as needed for erectile dysfunction. 30 tablet 4  . traZODone (DESYREL) 100 MG tablet Take 1 tablet (100 mg total) by mouth at bedtime. 90 tablet 3   No current facility-administered medications for this visit.    Allergies:   Patient has no known allergies.   Social History: Social History   Socioeconomic History  . Marital status: Single    Spouse name: Not on file  . Number of children: Not on file  . Years of education: Not on file  . Highest education level: Not on file  Occupational History  . Not on file  Tobacco Use  . Smoking status: Former Smoker    Packs/day: 0.00    Years: 30.00    Pack years: 0.00    Types: Cigarettes    Quit date: 05/31/2013    Years since quitting: 7.9  . Smokeless tobacco: Never Used  Vaping Use  . Vaping Use: Never used  Substance and Sexual Activity  . Alcohol use: Not Currently    Comment: ocassionally  . Drug use: Never  . Sexual activity: Not Currently  Other Topics Concern  . Not on file  Social History Narrative   Pt lies in split level home with his son, daughter and grand-son   Has 3 children   Some college education   Retired Scientist, forensic.    Right handed   Social Determinants of Health   Financial Resource Strain: Not on file  Food Insecurity: Not on file  Transportation Needs: Not on file  Physical Activity: Not on file  Stress: Not on file  Social Connections: Not on file  Intimate Partner Violence: Not on file    Family History: Family History  Problem Relation Age of Onset  . Heart disease Mother   . Brain cancer Father   . Hypertension Daughter     Review of Systems: All other systems reviewed and are otherwise negative except as noted above.   Physical Exam: Vitals:   05/09/21 0948  BP:  120/72  Pulse: 73  SpO2: 97%  Weight: 233 lb (105.7 kg)  Height: 6\' 6"  (1.981 m)     GEN- The patient is well appearing, alert and oriented x 3 today.   HEENT: normocephalic, atraumatic; sclera clear, conjunctiva pink; hearing intact; oropharynx clear; neck supple, no JVP Lymph- no cervical lymphadenopathy Lungs- Clear to ausculation bilaterally, normal work of breathing.  No wheezes, rales, rhonchi Heart- Regular rate and rhythm, no murmurs, rubs or gallops, PMI not laterally displaced GI- soft, non-tender, non-distended, bowel sounds present, no hepatosplenomegaly Extremities- no clubbing or cyanosis. No edema; DP/PT/radial pulses 2+ bilaterally MS- no significant deformity or atrophy Skin- warm and dry, no rash or lesion; ICD pocket well healed Psych- euthymic mood, full affect Neuro- strength and sensation are intact  ICD interrogation- reviewed in detail today,  See PACEART report  EKG:  EKG is not ordered today.   Recent Labs: 09/10/2020: TSH 0.747 09/27/2020: Magnesium 1.8 11/15/2020: ALT 15; BUN 14; Creatinine, Ser 1.02; Hemoglobin 12.4; Platelets 265; Potassium 4.6; Sodium 138   Wt Readings from Last 3 Encounters:  05/09/21 233 lb (105.7 kg)  03/20/21 243 lb 9.6 oz (110.5 kg)  03/05/21 247 lb (112 kg)     Other studies Reviewed: Additional studies/ records that were reviewed today include:  Previous EP office notes  [facility] 09/24/2020  Ost RCA to Mid RCA lesion is 100% stenosed.  Previously placed 1st Mrg stent (unknown type) is widely patent.  Prox Cx lesion is 100% stenosed.  Previously placed 1st Diag stent (unknown type) is widely patent.  Previously placed Prox LAD to Mid LAD stent (unknown type) is widely patent.  Echo 09/24/2020 LVEF 40-45%, Grade 1 DD  Assessment and Plan:  1.  Chronic systolic dysfunction s/p Medtronic dual chamber ICD  euvolemic today Stable on an appropriate medical regimen Normal ICD function See Claudia Desanctis Art report No  changes today He has had elevated Optivol since 09/2020, at which time invasive RHC showed CVP ~7 and PCWP of ~10, LVEDP 10.    2. Afib in setting of shock Remains in NSR Continue amio, po dilt, and eliquis. Without insurance, may be difficult to continue Eliquis long term.CHA2DS2Vasc is 3.   3. CAD with prior PCI Echo 09/10/2020 LVEF 55-60% Denies s/s ischemia.   4. Hx of VT Has MDT ICD PreservedEFEcho10/2021 No further.  Current medicines are reviewed at length with the patient today.   The patient does not have concerns regarding his medicines.  The following changes were made today:  none  Labs/ tests ordered today include:  Orders Placed This Encounter  Procedures  . CUP PACEART INCLINIC DEVICE CHECK     Disposition:   Follow up with Dr. Sallyanne Kuster as scheduled by patient request.     Signed, Shirley Friar, PA-C  05/09/2021 11:22 AM  [facility] [address] [place] [address] [phone] (office) [phone] (fax)

## 2021-05-09 ENCOUNTER — Encounter: Payer: Self-pay | Admitting: Student

## 2021-05-09 ENCOUNTER — Ambulatory Visit (INDEPENDENT_AMBULATORY_CARE_PROVIDER_SITE_OTHER): Payer: Worker's Compensation | Admitting: Student

## 2021-05-09 ENCOUNTER — Other Ambulatory Visit: Payer: Self-pay

## 2021-05-09 VITALS — BP 120/72 | HR 73 | Ht 78.0 in | Wt 233.0 lb

## 2021-05-09 DIAGNOSIS — I5022 Chronic systolic (congestive) heart failure: Secondary | ICD-10-CM | POA: Diagnosis not present

## 2021-05-09 DIAGNOSIS — I2510 Atherosclerotic heart disease of native coronary artery without angina pectoris: Secondary | ICD-10-CM

## 2021-05-09 DIAGNOSIS — I472 Ventricular tachycardia, unspecified: Secondary | ICD-10-CM

## 2021-05-09 DIAGNOSIS — I10 Essential (primary) hypertension: Secondary | ICD-10-CM

## 2021-05-09 DIAGNOSIS — I255 Ischemic cardiomyopathy: Secondary | ICD-10-CM

## 2021-05-09 LAB — CUP PACEART INCLINIC DEVICE CHECK
Battery Remaining Longevity: 64 mo
Battery Voltage: 2.98 V
Brady Statistic AP VP Percent: 0.07 %
Brady Statistic AP VS Percent: 15.99 %
Brady Statistic AS VP Percent: 0.1 %
Brady Statistic AS VS Percent: 83.84 %
Brady Statistic RA Percent Paced: 16.01 %
Brady Statistic RV Percent Paced: 0.18 %
Date Time Interrogation Session: 20220526103622
HighPow Impedance: 37 Ohm
HighPow Impedance: 48 Ohm
Implantable Lead Implant Date: 20051120
Implantable Lead Implant Date: 20101117
Implantable Lead Location: 753859
Implantable Lead Location: 753860
Implantable Lead Model: 5076
Implantable Lead Model: 7121
Implantable Pulse Generator Implant Date: 20180105
Lead Channel Impedance Value: 285 Ohm
Lead Channel Impedance Value: 361 Ohm
Lead Channel Impedance Value: 399 Ohm
Lead Channel Pacing Threshold Amplitude: 0.5 V
Lead Channel Pacing Threshold Amplitude: 1 V
Lead Channel Pacing Threshold Pulse Width: 0.4 ms
Lead Channel Pacing Threshold Pulse Width: 0.4 ms
Lead Channel Sensing Intrinsic Amplitude: 12.5 mV
Lead Channel Sensing Intrinsic Amplitude: 3.25 mV
Lead Channel Setting Pacing Amplitude: 1.5 V
Lead Channel Setting Pacing Amplitude: 2 V
Lead Channel Setting Pacing Pulse Width: 0.4 ms
Lead Channel Setting Sensing Sensitivity: 0.6 mV

## 2021-05-09 NOTE — Progress Notes (Signed)
Remote ICD transmission.   

## 2021-05-29 ENCOUNTER — Telehealth: Payer: Self-pay | Admitting: Cardiovascular Disease

## 2021-05-29 MED ORDER — FUROSEMIDE 40 MG PO TABS: 20.0000 mg | ORAL_TABLET | Freq: Every day | ORAL | 5 refills | Status: DC

## 2021-05-29 NOTE — Telephone Encounter (Signed)
Patient aware and verbalized understanding.  Med list updated.

## 2021-05-29 NOTE — Telephone Encounter (Signed)
Returned call to patient who reports low BP readings starting Saturday or Sunday (no readings from previous days).   Reports he went to the gym this morning, felt fine and when he returned home he felt like "the lights were too bright" so his daughter checked his BP and it was 83/43 on both arms, reading was about 30 mins ago.   Has some lightheadedness with standing.   He has not taken sildenafil lately but reports restarting trazodone a few weeks ago.    Reports adequate intake but may not be enough due to heat this week.   No symptoms currently.      Current medications:  Amiodarone 200 daily Farxiga 10 daily Entresto 97/103 BID Lasix 40 daily Toprol  XL 100 daily   Advised will route to pharmD to review (Dr. Sallyanne Kuster out of office).  Patient aware.

## 2021-05-29 NOTE — Telephone Encounter (Signed)
Pt c/o BP issue: STAT if pt c/o blurred vision, one-sided weakness or slurred speech  1. What are your last 5 BP readings? 83/43 today  2. Are you having any other symptoms (ex. Dizziness, headache, blurred vision, passed out)? Dizziness and lightheaded   3. What is your BP issue? BP running low

## 2021-05-29 NOTE — Telephone Encounter (Signed)
HOLD furosemide tomorrow, then resume at 20mg  daily on 05/31/2021.  Continue to monitor BP twice daily and call back if symptoms not resolved or worsen.

## 2021-06-03 ENCOUNTER — Ambulatory Visit (INDEPENDENT_AMBULATORY_CARE_PROVIDER_SITE_OTHER): Payer: Worker's Compensation

## 2021-06-03 DIAGNOSIS — Z95810 Presence of automatic (implantable) cardiac defibrillator: Secondary | ICD-10-CM | POA: Diagnosis not present

## 2021-06-03 DIAGNOSIS — I5022 Chronic systolic (congestive) heart failure: Secondary | ICD-10-CM

## 2021-06-03 LAB — CUP PACEART REMOTE DEVICE CHECK
Battery Remaining Longevity: 65 mo
Battery Voltage: 2.99 V
Brady Statistic AP VP Percent: 0.02 %
Brady Statistic AP VS Percent: 10.7 %
Brady Statistic AS VP Percent: 0.07 %
Brady Statistic AS VS Percent: 89.21 %
Brady Statistic RA Percent Paced: 10.7 %
Brady Statistic RV Percent Paced: 0.09 %
Date Time Interrogation Session: 20220620033323
HighPow Impedance: 35 Ohm
HighPow Impedance: 46 Ohm
Implantable Lead Implant Date: 20051120
Implantable Lead Implant Date: 20101117
Implantable Lead Location: 753859
Implantable Lead Location: 753860
Implantable Lead Model: 5076
Implantable Lead Model: 7121
Implantable Pulse Generator Implant Date: 20180105
Lead Channel Impedance Value: 247 Ohm
Lead Channel Impedance Value: 342 Ohm
Lead Channel Impedance Value: 361 Ohm
Lead Channel Pacing Threshold Amplitude: 0.5 V
Lead Channel Pacing Threshold Amplitude: 0.875 V
Lead Channel Pacing Threshold Pulse Width: 0.4 ms
Lead Channel Pacing Threshold Pulse Width: 0.4 ms
Lead Channel Sensing Intrinsic Amplitude: 11.625 mV
Lead Channel Sensing Intrinsic Amplitude: 11.625 mV
Lead Channel Sensing Intrinsic Amplitude: 2.75 mV
Lead Channel Sensing Intrinsic Amplitude: 2.75 mV
Lead Channel Setting Pacing Amplitude: 1.5 V
Lead Channel Setting Pacing Amplitude: 2 V
Lead Channel Setting Pacing Pulse Width: 0.4 ms
Lead Channel Setting Sensing Sensitivity: 0.6 mV

## 2021-06-03 NOTE — Progress Notes (Signed)
EPIC Encounter for ICM Monitoring  Patient Name: Gregory [name] is a 60 y.o. male Date: 06/03/2021 Primary Care Physican: Patient, [name] Primary Cardiologist: [place] Electrophysiologist: Caryl Comes 05/09/2021 Weight: 233 lbs   VT-NS (>4 beats, >150 bpm) 2       1st ICM Remote Transmission.  Heart Failure questions reviewed.  Pt asymptomatic for fluid accumulation.  He states Furosemide dosage cut in half due to low BP.  Today BP was 82/56. He continues to experience some lightheadedness but has improved since Furosemide dosage was lowered on 05/31/2021.    Optivol thoracic impedance suggesting possible fluid accumulation starting 09/2020. Dr Victorino December 03/05/2021 OV note suggesting the report may not associate with his clinical status and device may eventually re-calibrate.   There was a slight change in impedance when Furosemide was held for low BP on 6/15/-6/16.  Prescribed:  Furosemide 40 mg Take 0.5 tablets (20 mg total) by mouth daily (decreased 05/31/2021). Potassium 10 mEq take 1 tablet daily  Recommendations: Reinforced limiting salt intake.  Encouraged to call if experiencing fluid symptoms.  Follow-up plan: ICM clinic phone appointment on 07/10/2021.   91 day device clinic remote transmission 07/18/2021.    EP/Cardiology Office Visits: 06/27/2021 with Dr. Sallyanne Kuster.    Copy of ICM check sent to Dr. Caryl Comes and Dr Sallyanne Kuster.   3 month ICM trend: 06/03/2021.    1 Year ICM trend:       Rosalene Billings, RN 06/03/2021 2:20 PM

## 2021-06-27 ENCOUNTER — Encounter: Payer: Self-pay | Admitting: Cardiovascular Disease

## 2021-06-27 ENCOUNTER — Ambulatory Visit (INDEPENDENT_AMBULATORY_CARE_PROVIDER_SITE_OTHER): Payer: Worker's Compensation | Admitting: Cardiovascular Disease

## 2021-06-27 ENCOUNTER — Ambulatory Visit: Payer: Self-pay | Admitting: Cardiovascular Disease

## 2021-06-27 ENCOUNTER — Other Ambulatory Visit: Payer: Self-pay

## 2021-06-27 VITALS — BP 133/71 | HR 85 | Ht 78.0 in | Wt 231.6 lb

## 2021-06-27 DIAGNOSIS — E7800 Pure hypercholesterolemia, unspecified: Secondary | ICD-10-CM

## 2021-06-27 DIAGNOSIS — Z5181 Encounter for therapeutic drug level monitoring: Secondary | ICD-10-CM

## 2021-06-27 DIAGNOSIS — I5022 Chronic systolic (congestive) heart failure: Secondary | ICD-10-CM

## 2021-06-27 DIAGNOSIS — I472 Ventricular tachycardia, unspecified: Secondary | ICD-10-CM

## 2021-06-27 DIAGNOSIS — G4733 Obstructive sleep apnea (adult) (pediatric): Secondary | ICD-10-CM

## 2021-06-27 DIAGNOSIS — I2510 Atherosclerotic heart disease of native coronary artery without angina pectoris: Secondary | ICD-10-CM

## 2021-06-27 DIAGNOSIS — Z79899 Other long term (current) drug therapy: Secondary | ICD-10-CM

## 2021-06-27 DIAGNOSIS — Z95810 Presence of automatic (implantable) cardiac defibrillator: Secondary | ICD-10-CM | POA: Diagnosis not present

## 2021-06-27 DIAGNOSIS — I10 Essential (primary) hypertension: Secondary | ICD-10-CM

## 2021-06-27 LAB — PACEMAKER DEVICE OBSERVATION

## 2021-06-27 MED ORDER — METOPROLOL SUCCINATE ER 25 MG PO TB24: ORAL_TABLET | ORAL | 3 refills | Status: DC

## 2021-06-27 MED ORDER — FUROSEMIDE 20 MG PO TABS: 20.0000 mg | ORAL_TABLET | Freq: Every day | ORAL | 3 refills | Status: DC

## 2021-06-27 NOTE — Patient Instructions (Addendum)
Medication Instructions:  TAKE Metoprolol Succinate 25 mg in the morning and 50 mg in the evening  *If you need a refill on your cardiac medications before your next appointment, please call your pharmacy*   Lab Work: Your provider would like for you to have the following labs today: CMET, CBC, Magnesium, and TSH  If you have labs (blood work) drawn today and your tests are completely normal, you will receive your results only by: [place] (if you have MyChart) OR A paper copy in the mail If you have any lab test that is abnormal or we need to change your treatment, we will call you to review the results.   Testing/Procedures: None ordered   Follow-Up: At [facility], you and your health needs are our priority.  As part of our continuing mission to provide you with exceptional heart care, we have created designated Provider Care Teams.  These Care Teams include your primary Cardiologist (physician) and Advanced Practice Providers (APPs -  Physician Assistants and Nurse Practitioners) who all work together to provide you with the care you need, when you need it.  We recommend signing up for the patient portal called "MyChart".  Sign up information is provided on this After Visit Summary.  MyChart is used to connect with patients for Virtual Visits (Telemedicine).  Patients are able to view lab/test results, encounter notes, upcoming appointments, etc.  Non-urgent messages can be sent to your provider as well.   To learn more about what you can do with MyChart, go to [email or web address].    Your next appointment:   6 month(s)  The format for your next appointment:   In Person  Provider:   Sanda Klein, MD

## 2021-06-27 NOTE — Progress Notes (Signed)
Patient ID: Gregory [name], male   DOB: 1961-10-11, 60 y.o.   MRN: [id number]    Cardiology Office Note    Date:  06/27/2021   ID:  Gregory, [name] 07-19-1961, MRN [id number]  PCP:  Patient, [name]  Cardiologist:  Jolyn Nap, M.D.; Sanda Klein, MD   No chief complaint on file.   History of Present Illness:  Gregory [name] is a 60 y.o. 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  His defibrillator was initially implanted in 2006 with the most recent generator change in 2018 (Medtronic Evera XT DR).  In 2017 he was found to have slow VT which prompted reduction of the VT detection zone to 150 bpm.  He received appropriate antitachycardia pacing for ventricular tachycardia in January 2021.  He was hospitalized in late September 2021 with a picture suggesting sepsis and hypovolemia, accompanied by uninterrupted atrial fibrillation with rapid ventricular response for couple of days before that.  Amiodarone was initiated then he underwent an unsuccessful cardioversion on 09/13/2020.  Had some altered mental status, questionable stroke.  A second attempt at cardioversion, this time successfully on 09/19/2020 09/22/2020 he had monomorphic ventricular tachycardia for which she received therapy from his defibrillator.  He underwent cardiac catheterization on 09/24/2020 confirming the known total occlusion of the right coronary artery and showing that previously placed stents were widely patent.  Note difficulty in radial approach and it was recommended that future coronary  angio be performed from the femoral approach.  Filling pressures were normal and there was very mild systolic pulmonary hypertension.  Cardiac output was high.  Repeat echo on 09/22/2020 showed unchanged LVEF 40-45%.   He has generally done well since his last appointment and has not required repeat hospitalization or adjustment in his diuretic dose.  He has not had noticeable palpitations and has not experienced syncope, but on a daily basis he has episodes of lightheadedness when standing.  These occur more commonly at night and he has noticed that they correlate with periods when his diastolic blood pressure is less than 60, regardless of what the systolic blood pressure might be.  He denies edema, orthopnea, PND or angina pectoris.  He has not had any defibrillator discharges.  He is physically active.  He exercises 45 minutes at least 5 or 6 days a week on a stationary bike.  When the weather became warm he stopped walking outside because of dizziness.  Because of his current exercise pattern, his defibrillator appears to show reduction in activity because he has replaced walking with a stationary bike (activity down from roughly 2 hours a day to roughly 1 hour a day).  His "dry weight" is a moving target.  He has lost about 60 pounds in the last 10 months.  Ever since his illness last fall his OptiVol has been out of range and has never really reached back to baseline, although he had a heart catheterization that showed normal filling pressures in October, when the OptiVol was supposedly abnormal.  He has not had a repeat lipid profile since January 2021 unfortunately.  Similarly his most recent hemoglobin A1c was 6.9% in October 2021.  As mentioned he has lost a lot of  weight.  He is on maximum dose Entresto and also takes [place] and a hefty dose of metoprolol succinate.  He is not receiving spironolactone and is on a very low-dose of furosemide 20 mg once daily.  He is on chronic combined oral  anticoagulant (Eliquis) and antiplatelet (clopidogrel) therapy.  He has not had any falls, injuries or bleeding problems.  He is on maximum dose atorvastatin with added ezetimibe.  Interrogation of his defibrillator performed by me today shows normal generator and lead parameters.  Estimated generator longevity is 5.5 years.  He has only 13% atrial pacing and never requires ventricular pacing.  He has not had any atrial fibrillation since his acute illness last year.  He has had 4 recent episodes of nonsustained ventricular tachycardia, 1 of which was fairly lengthy and lasted for 16 seconds, on July 4.  It was relatively slow at 155 bpm and did not trigger device intervention.  There is 1: 1 A-V dissociation, but it is fairly clear that this is ventricular tachycardia with retrograde atrial activation.  The QRS morphology is identical with other episodes of nonsustained VT where there is A-V dissociation and is very similar to frequent PVCs that are seen during device interrogation.  In the past he has undergone cardioversion in the emergency room for slow monomorphic VT that was below the ICD detection rate (currently the lowest VT zone is at 150 bpm and requires 100 beats for detection and 48 beats for rate detection.  In May 2017 he had palpitations, visual changes and slight dizziness and subsequently presented with sustained monomorphic ventricular tachycardia below ICD detection rate. He underwent urgent cardioversion in the emergency department. He subsequently underwent right and left heart catheterization showing no real change in coronary anatomy. A lateral branch of his first oblique marginal artery was subtotally occluded and there was high-grade disease in his PLA artery, neither one amenable to revascularization. His left ventricular end-diastolic pressure was however severely elevated. Amiodarone was initiated. VT detection rate was decreased to 150 bpm after hospitalization and subsequently  decreased to 130 bpm when he returned with recurrent VT at his office appointment with Dr. Caryl Comes on June 27. Pre-shock antitachycardia pacing was made more aggressive. Mexiletine was stopped. Her remains on amiodarone at maintenance dose 200 mg daily, without side effects so far.   Past Medical History:  Diagnosis Date   CAD (coronary artery disease) 12/01/2013   Chronic combined systolic and diastolic CHF, NYHA class 1 ([place]) 12/01/2013   Erectile dysfunction 12/01/2013   HTN (hypertension) 12/01/2013   Hyperlipidemia 12/01/2013   Ischemic cardiomyopathy 12/01/2013   Sleep apnea     Past Surgical History:  Procedure Laterality Date   ACHILLES TENDON REPAIR     lft foot   CARDIAC CATHETERIZATION N/A 05/05/2016   Procedure: Left Heart Cath and Coronary Angiography;  Surgeon: Leonie Man, MD;  Location: [facility];  Service: Cardiovascular;  Laterality: N/A;   CARDIAC DEFIBRILLATOR PLACEMENT     CARDIOVERSION N/A 09/13/2020   Procedure: CARDIOVERSION;  Surgeon: Werner Lean, MD;  Location: [facility];  Service: Cardiovascular;  Laterality: N/A;   CARDIOVERSION N/A 09/19/2020   Procedure: CARDIOVERSION;  Surgeon: [name], MD;  Location[facility];  Service: Cardiovascular;  Laterality: N/A;   EP IMPLANTABLE DEVICE N/A 12/19/2016   Procedure: ICD Generator Changeout;  Surgeon: [name], MD;  Location: [facility];  Service: Cardiovascular;  Laterality: N/A;   RIGHT/LEFT HEART CATH AND CORONARY ANGIOGRAPHY N/A  09/24/2020   Procedure: RIGHT/LEFT HEART CATH AND CORONARY ANGIOGRAPHY;  Surgeon: Lorretta Harp, MD;  Location: [place] [facility];  Service: Cardiovascular;  Laterality: N/A;   WRIST TENODESIS      Outpatient Medications Prior to Visit  Medication Sig Dispense Refill   amiodarone (PACERONE) 200 MG tablet TAKE 1 TABLET BY MOUTH DAILY 30 tablet 11   apixaban (ELIQUIS) 5 MG TABS tablet Take 1 tablet (5 mg total) by mouth 2 (two) times  daily. Please put note on bag to call office for appointment 180 tablet 1   atorvastatin (LIPITOR) 80 MG tablet Take 1 tablet (80 mg total) by mouth every evening. 90 tablet 3   clopidogrel (PLAVIX) 75 MG tablet TAKE 1 TABLET(75 MG) BY MOUTH DAILY 90 tablet 2   dapagliflozin propanediol (FARXIGA) 10 MG TABS tablet Take 1 tablet (10 mg total) by mouth daily before breakfast. 30 tablet 5   ENTRESTO 97-103 MG TAKE 1 TABLET BY MOUTH TWICE DAILY 60 tablet 10   levETIRAcetam (KEPPRA) 500 MG tablet Take 1 tablet (500 mg total) by mouth 2 (two) times daily. 180 tablet 3   oxyCODONE (OXY IR/ROXICODONE) 5 MG immediate release tablet Take 1 tablet (5 mg total) by mouth every 12 (twelve) hours as needed for severe pain. 10 tablet 0   pantoprazole (PROTONIX) 40 MG tablet TAKE 1 TABLET(40 MG) BY MOUTH DAILY 30 tablet 11   potassium chloride (KLOR-CON) 10 MEQ tablet TAKE 1 TABLET(10 MEQ) BY MOUTH DAILY 30 tablet 9   sildenafil (VIAGRA) 50 MG tablet Take 1 tablet (50 mg total) by mouth as needed for erectile dysfunction. 30 tablet 4   traZODone (DESYREL) 100 MG tablet Take 1 tablet (100 mg total) by mouth at bedtime. 90 tablet 3   furosemide (LASIX) 40 MG tablet Take 0.5 tablets (20 mg total) by mouth daily. 30 tablet 5   ezetimibe (ZETIA) 10 MG tablet Take 1 tablet (10 mg total) by mouth daily. 90 tablet 3   melatonin 5 MG TABS Take 1 tablet (5 mg total) by mouth at bedtime. (Patient not taking: Reported on 06/27/2021) 30 tablet 0   metoprolol succinate (TOPROL-XL) 100 MG 24 hr tablet Take 1 tablet (100 mg total) by mouth daily. Take with or immediately following a meal. 90 tablet 3   No facility-administered medications prior to visit.     Allergies:   Patient has no known allergies.   Social History   Socioeconomic History   Marital status: Single    Spouse name: Not on file   Number of children: Not on file   Years of education: Not on file   Highest education level: Not on file  Occupational History    Not on file  Tobacco Use   Smoking status: Former    Packs/day: 0.00    Years: 30.00    Pack years: 0.00    Types: Cigarettes    Quit date: 05/31/2013    Years since quitting: 8.0   Smokeless tobacco: Never  Vaping Use   Vaping Use: Never used  Substance and Sexual Activity   Alcohol use: Not Currently    Comment: ocassionally   Drug use: Never   Sexual activity: Not Currently  Other Topics Concern   Not on file  Social History Narrative   Pt lies in split level home with his son, daughter and grand-son   Has 3 children   Some college education   Retired Scientist, forensic.    Right handed  Social Determinants of Health   Financial Resource Strain: Not on file  Food Insecurity: Not on file  Transportation Needs: Not on file  Physical Activity: Not on file  Stress: Not on file  Social Connections: Not on file     Family History:  The patient's family history includes Brain cancer in his father; Heart disease in his mother; Hypertension in his daughter.   ROS:   Please see the history of present illness.    ROS All other systems reviewed and are negative.   PHYSICAL EXAM:   VS:  BP 133/71   Pulse 85   Ht 6\' 6"  (1.981 m)   Wt 231 lb 9.6 oz (105.1 kg)   SpO2 96%   BMI 26.76 kg/m      General: Alert, oriented x3, no distress, minimally overweight.  Healthy left subclavian ICD site. Head: no evidence of trauma, PERRL, EOMI, no exophtalmos or lid lag, no myxedema, no xanthelasma; normal ears, nose and oropharynx Neck: normal jugular venous pulsations and no hepatojugular reflux; brisk carotid pulses without delay and no carotid bruits Chest: clear to auscultation, no signs of consolidation by percussion or palpation, normal fremitus, symmetrical and full respiratory excursions Cardiovascular: normal position and quality of the apical impulse, regular rhythm, normal first and second heart sounds, no murmurs, rubs or gallops Abdomen: no tenderness or  distention, no masses by palpation, no abnormal pulsatility or arterial bruits, normal bowel sounds, no hepatosplenomegaly Extremities: no clubbing, cyanosis or edema; 2+ radial, ulnar and brachial pulses bilaterally; 2+ right femoral, posterior tibial and dorsalis pedis pulses; 2+ left femoral, posterior tibial and dorsalis pedis pulses; no subclavian or femoral bruits Neurological: grossly nonfocal Psych: Normal mood and affect    Wt Readings from Last 3 Encounters:  06/27/21 231 lb 9.6 oz (105.1 kg)  05/09/21 233 lb (105.7 kg)  03/20/21 243 lb 9.6 oz (110.5 kg)      Studies/Labs Reviewed:    [facility] 09/24/2020 Ost RCA to Mid RCA lesion is 100% stenosed. Previously placed 1st Mrg stent (unknown type) is widely patent. Prox Cx lesion is 100% stenosed. Previously placed 1st Diag stent (unknown type) is widely patent. Previously placed Prox LAD to Mid LAD stent (unknown type) is widely patent.   Echo 09/24/2020 LVEF 40-45%, Grade 1 DD EKG:  EKG is not ordered today.  His intracardiac electrogram shows atrial sensed, ventricular sensed rhythm with frequent monomorphic PVCs.His most recent electrocardiogram from 03/05/2021 shows atrial paced, ventricular sensed rhythm with long AV delay and frequent PVCs, old inferior infarction, nonspecific minor IVCD with QRS at about 128 ms and QTC borderline prolonged at 482 ms Recent Labs: 09/10/2020: TSH 0.747 09/27/2020: Magnesium 1.8 11/15/2020: ALT 15; BUN 14; Creatinine, Ser 1.02; Hemoglobin 12.4; Platelets 265; Potassium 4.6; Sodium 138   Lipid Panel    Component Value Date/Time   CHOL 137 12/30/2019 1242   TRIG 78 12/30/2019 1242   HDL 37 (L) 12/30/2019 1242   CHOLHDL 3.7 12/30/2019 1242   CHOLHDL 4.2 05/05/2016 0526   VLDL 11 05/05/2016 0526   LDLCALC 85 12/30/2019 1242     ASSESSMENT:    1. Chronic systolic congestive heart failure ([place])   2. Ventricular tachycardia ([employer])   3. Encounter for monitoring amiodarone therapy   4.  ICD (implantable cardioverter-defibrillator) in place   5. Coronary artery disease involving native coronary artery of native heart without angina pectoris   6. Essential hypertension   7. Hypercholesterolemia   8. OSA (obstructive sleep apnea)  PLAN:  In order of problems listed above:  CHF: NYHA functional class I-2 and clinically euvolemic (his OptiVol does not appear to be reliable during the last several months).  It is possible that he might even be mildly hypovolemic which could explain his dizziness, but he is on a tiny dose of loop diuretic as well as [place].  He is lost a lot of real weight and may not tolerate the same amount of heart failure/blood pressure medications.  Do not want to reduce the dose of Entresto.  We will cut back slightly on his dose of metoprolol, hoping that this will not lead to an increase in the frequency of VT, mostly controlled by his amiodarone. VT: he has a large inferolateral scar which is probably the source of VT. he has relatively infrequent episodes of nonsustained ventricular tachycardia while on amiodarone therapy, the most recent episode of VT was about 40 beats long and would have eventually received therapy if it had lasted long enough.  It was not symptomatic. Amiodarone: Tolerating this without side effects.  It is time to repeat liver and thyroid function tests.  Reminded him of the need for yearly eye examination and the fact that he should promptly report any otherwise unexplained respiratory symptoms due to the risk of amiodarone toxicity. ICD: Appropriate device function and settings.  This is followed by Dr. Caryl Comes. CAD: Angina free.  Known chronic total occlusion of the right coronary artery but all other major conduits/stents open. HTN: Excessive control and occasional dizziness and hypotension.  We will reduce the metoprolol to 50 mg in the evening and 25 mg in the morning. HLP: On maximum dose atorvastatin as well as Zetia.  Has not  had a repeat lipid profile.  He is unfortunately not fasting today.  We will order this in the near future. DM: Well-controlled.  Continue Farxiga for both diabetes and heart failure.  No longer on metformin.  Needs a hemoglobin A1c update. ED: Good response to PDE 5 inhibitors, he knows that he should never mix these with nitrates. OSA: Reports compliance with CPAP in the absence of daytime hypersomnolence.    Medication Adjustments/Labs and Tests Ordered: Current medicines are reviewed at length with the patient today.  Concerns regarding medicines are outlined above.  Medication changes, Labs and Tests ordered today are listed in the Patient Instructions below. Patient Instructions  Medication Instructions:  TAKE Metoprolol Succinate 25 mg in the morning and 50 mg in the evening  *If you need a refill on your cardiac medications before your next appointment, please call your pharmacy*   Lab Work: Your provider would like for you to have the following labs today: CMET, CBC, Magnesium, and TSH  If you have labs (blood work) drawn today and your tests are completely normal, you will receive your results only by: [place] (if you have MyChart) OR A paper copy in the mail If you have any lab test that is abnormal or we need to change your treatment, we will call you to review the results.   Testing/Procedures: None ordered   Follow-Up: At [facility], you and your health needs are our priority.  As part of our continuing mission to provide you with exceptional heart care, we have created designated Provider Care Teams.  These Care Teams include your primary Cardiologist (physician) and Advanced Practice Providers (APPs -  Physician Assistants and Nurse Practitioners) who all work together to provide you with the care you need, when you need it.  We recommend signing up for the patient portal called "MyChart".  Sign up information is provided on this After Visit Summary.   MyChart is used to connect with patients for Virtual Visits (Telemedicine).  Patients are able to view lab/test results, encounter notes, upcoming appointments, etc.  Non-urgent messages can be sent to your provider as well.   To learn more about what you can do with MyChart, go to [email or web address].    Your next appointment:   6 month(s)  The format for your next appointment:   In Person  Provider:   Sanda Klein, MD     Signed, Sanda Klein, MD  06/27/2021 7:55 PM    [address], [place], [place]  [address] Phone: [phone]; Fax: [phone]

## 2021-06-28 LAB — COMPREHENSIVE METABOLIC PANEL
ALT: 14 IU/L (ref 0–44)
AST: 11 IU/L (ref 0–40)
Albumin/Globulin Ratio: 1.5 (ref 1.2–2.2)
Albumin: 4.1 g/dL (ref 3.8–4.8)
Alkaline Phosphatase: 57 IU/L (ref 44–121)
BUN/Creatinine Ratio: 12 (ref 10–24)
BUN: 12 mg/dL (ref 8–27)
Bilirubin Total: 0.2 mg/dL (ref 0.0–1.2)
CO2: 23 mmol/L (ref 20–29)
Calcium: 8.7 mg/dL (ref 8.6–10.2)
Chloride: 101 mmol/L (ref 96–106)
Creatinine, Ser: 0.98 mg/dL (ref 0.76–1.27)
Globulin, Total: 2.7 g/dL (ref 1.5–4.5)
Glucose: 100 mg/dL — ABNORMAL HIGH (ref 65–99)
Potassium: 4.7 mmol/L (ref 3.5–5.2)
Sodium: 140 mmol/L (ref 134–144)
Total Protein: 6.8 g/dL (ref 6.0–8.5)
eGFR: 87 mL/min/{1.73_m2} (ref >59)

## 2021-06-28 LAB — BRAIN NATRIURETIC PEPTIDE: BNP: 457.1 pg/mL — ABNORMAL HIGH (ref 0.0–100.0)

## 2021-06-28 LAB — MAGNESIUM: Magnesium: 2.5 mg/dL — ABNORMAL HIGH (ref 1.6–2.3)

## 2021-06-28 LAB — TSH: TSH: 10.1 u[IU]/mL — ABNORMAL HIGH (ref 0.450–4.500)

## 2021-07-03 MED ORDER — SACUBITRIL-VALSARTAN 49-51 MG PO TABS: 1.0000 | ORAL_TABLET | Freq: Two times a day (BID) | ORAL | 2 refills | Status: DC

## 2021-07-05 ENCOUNTER — Telehealth: Payer: Self-pay | Admitting: *Deleted

## 2021-07-05 NOTE — Telephone Encounter (Signed)
Received a message from [employer] with [place] stating that she needs a letter from Dr. Sallyanne Kuster stating the following about Farxiga 10 mg:  the medication Farxiga '10mg'$  he's been taking since ____________ and if it's been effective without adverse side effects, and any additional info per below.  Also will need to complete another Request for Authorization (RFA) for the medication (attached).  This is what the UR determination said:  Rationale for Determination(s):  The provider discusses that the claimant requires a moderate dose of a loop diuretic. The claimant is on a maximum dose of Entresto and a good dose of metoprolol. The provider added [place]. The provider notes that this will help both with glycemic control and especially with heart failure. When the claimant starts the [place], they will reduce the dose of furosemide to avoid hypovolemia. The metformin will be discontinued as well. Based on the submitted documentation and peer-reviewed literature, the medical necessity of this medication is established. While the use of this medication is currently medically necessary, ongoing reassessment of response, including benefit and/or adverse reaction, is warranted. Therefore, to allow the provider time to assess the claimant's ongoing response to the medication, the request is modified to [place] '10mg'$  once daily #30. Additional certification will require evidence of continued medication efficacy.

## 2021-07-08 NOTE — Telephone Encounter (Signed)
Information has been faxed  

## 2021-07-10 ENCOUNTER — Ambulatory Visit (INDEPENDENT_AMBULATORY_CARE_PROVIDER_SITE_OTHER): Payer: No Typology Code available for payment source

## 2021-07-10 DIAGNOSIS — I5022 Chronic systolic (congestive) heart failure: Secondary | ICD-10-CM

## 2021-07-10 DIAGNOSIS — Z95810 Presence of automatic (implantable) cardiac defibrillator: Secondary | ICD-10-CM

## 2021-07-10 NOTE — Progress Notes (Signed)
EPIC Encounter for ICM Monitoring  Patient Name: Gregory [name] is a 60 y.o. male Date: 07/10/2021 Primary Care Physican: Patient, [name] Primary Cardiologist: [place] Electrophysiologist: Caryl Comes 05/09/2021 Weight: 233 lbs                                               Spoke with patient and he is doing well.  Provided patient with insurance coverage information that received from billing department.  Explained his insurance covers $25 for ICM code which will be monthly. His monthly out of pocket cost is ~$50 a month for ICM.    Optivol thoracic impedance remains unchanged and continues to suggest possible fluid accumulation starting 09/2020.  Dr Victorino December 06/27/2021 OV note states he is clinically euvolemic (his OptiVol does not appear to be reliable during the last several months).      Prescribed: Furosemide 20 mg Take 1 tablet (20 mg total) by mouth daily (decreased 05/31/2021). Potassium 10 mEq take 1 tablet daily   Recommendations:  Patient prefers to return to remote monitoring every 3 months due ICM monthly costs is $50 a month costs out of pocket.  Advised will inform Dr Sallyanne Kuster of decision not to continue ICM monthly follow up at this time.     Follow-up plan: No further ICM follow up at this time.   91 day device clinic remote transmission 07/18/2021.     EP/Cardiology Office Visits: Recall 12/24/2021 with Dr. Sallyanne Kuster.     Copy of ICM check sent to Dr. Caryl Comes and Dr Sallyanne Kuster.   3 month ICM trend: 07/10/2021.    1 Year ICM trend:       Rosalene Billings, RN 07/10/2021 10:24 AM

## 2021-07-11 ENCOUNTER — Other Ambulatory Visit: Payer: Self-pay | Admitting: Internal Medicine

## 2021-07-15 ENCOUNTER — Telehealth: Payer: Self-pay | Admitting: Cardiovascular Disease

## 2021-07-15 ENCOUNTER — Telehealth: Payer: Self-pay

## 2021-07-15 MED ORDER — FUROSEMIDE 20 MG PO TABS: 20.0000 mg | ORAL_TABLET | ORAL | 3 refills | Status: DC

## 2021-07-15 NOTE — Telephone Encounter (Signed)
*  STAT* If patient is at the pharmacy, call can be transferred to refill team.   1. Which medications need to be refilled? (please list name of each medication and dose if known) apixaban (ELIQUIS) 5 MG TABS tablet  2. Which pharmacy/location (including street and city if local pharmacy) is medication to be sent to? [place],  - [address] AT [place]  3. Do they need a 30 day or 90 day supply? 90  Patient said the pharmacy is not getting the authorization form the doctor to refill the medication.   The patient is out of medication

## 2021-07-15 NOTE — Progress Notes (Signed)
Croitoru, Dani Gobble, MD  Tailey Top Panda, RN Agree we can discontinue the monthly [employer].

## 2021-07-15 NOTE — Telephone Encounter (Signed)
Returned patient call per voice mail request regarding billing.  Patient stated he called billing department last week as recommended to inform all billing should go through workers comp first.  He said billing department told him to call me back about the billing.  He reports that has instructed billing department via phone calls and mychart over the last few years that workers comp is primary but it continues to be billed incorrectly.  Provided office number and instructed to pick option 6 for billing.  Advised if he continues to have billing problems he can ask for a supervisor to help determine how to fix the problem.  Advised the billing person for EP will change the billing for the one time ICM code that occurred on 06/03/2021.

## 2021-07-16 MED ORDER — APIXABAN 5 MG PO TABS: 5.0000 mg | ORAL_TABLET | Freq: Two times a day (BID) | ORAL | 1 refills | Status: DC

## 2021-07-16 NOTE — Telephone Encounter (Signed)
Prescription refill request for Eliquis received. Indication:AFIB Last office visit:CROITORU 06/27/21 Scr:0.98 06/27/21 Age: 27M Weight:105.1KG

## 2021-07-16 NOTE — Telephone Encounter (Signed)
Patient is following up. He states he how now been completely out of medication fir 2 days. Please assist.

## 2021-07-18 ENCOUNTER — Ambulatory Visit (INDEPENDENT_AMBULATORY_CARE_PROVIDER_SITE_OTHER): Payer: No Typology Code available for payment source

## 2021-07-18 DIAGNOSIS — I472 Ventricular tachycardia, unspecified: Secondary | ICD-10-CM

## 2021-07-18 LAB — CUP PACEART REMOTE DEVICE CHECK
Battery Remaining Longevity: 63 mo
Battery Voltage: 2.99 V
Brady Statistic AP VP Percent: 0.01 %
Brady Statistic AP VS Percent: 14.43 %
Brady Statistic AS VP Percent: 0.05 %
Brady Statistic AS VS Percent: 85.51 %
Brady Statistic RA Percent Paced: 14.28 %
Brady Statistic RV Percent Paced: 0.06 %
Date Time Interrogation Session: 20220804033623
HighPow Impedance: 33 Ohm
HighPow Impedance: 42 Ohm
Implantable Lead Implant Date: 20051120
Implantable Lead Implant Date: 20101117
Implantable Lead Location: 753859
Implantable Lead Location: 753860
Implantable Lead Model: 5076
Implantable Lead Model: 7121
Implantable Pulse Generator Implant Date: 20180105
Lead Channel Impedance Value: 247 Ohm
Lead Channel Impedance Value: 342 Ohm
Lead Channel Impedance Value: 361 Ohm
Lead Channel Pacing Threshold Amplitude: 0.5 V
Lead Channel Pacing Threshold Amplitude: 1.125 V
Lead Channel Pacing Threshold Pulse Width: 0.4 ms
Lead Channel Pacing Threshold Pulse Width: 0.4 ms
Lead Channel Sensing Intrinsic Amplitude: 10.375 mV
Lead Channel Sensing Intrinsic Amplitude: 10.375 mV
Lead Channel Sensing Intrinsic Amplitude: 2.5 mV
Lead Channel Sensing Intrinsic Amplitude: 2.5 mV
Lead Channel Setting Pacing Amplitude: 1.5 V
Lead Channel Setting Pacing Amplitude: 2.25 V
Lead Channel Setting Pacing Pulse Width: 0.4 ms
Lead Channel Setting Sensing Sensitivity: 0.6 mV

## 2021-08-12 NOTE — Progress Notes (Signed)
Remote ICD transmission.   

## 2021-08-28 ENCOUNTER — Other Ambulatory Visit: Payer: Self-pay | Admitting: Student

## 2021-08-28 NOTE — Telephone Encounter (Signed)
This is Dr. Croitoru's pt 

## 2021-09-23 ENCOUNTER — Other Ambulatory Visit: Payer: Self-pay | Admitting: Neurology

## 2021-10-02 ENCOUNTER — Other Ambulatory Visit: Payer: Self-pay | Admitting: Cardiovascular Disease

## 2021-10-02 ENCOUNTER — Other Ambulatory Visit: Payer: Self-pay | Admitting: Neurology

## 2021-10-07 ENCOUNTER — Telehealth: Payer: Self-pay | Admitting: Cardiovascular Disease

## 2021-10-07 MED ORDER — SACUBITRIL-VALSARTAN 49-51 MG PO TABS
1.0000 | ORAL_TABLET | Freq: Two times a day (BID) | ORAL | 1 refills | Status: DC
Start: 2021-10-07 — End: 2021-10-07

## 2021-10-07 MED ORDER — SACUBITRIL-VALSARTAN 49-51 MG PO TABS: 1.0000 | ORAL_TABLET | Freq: Two times a day (BID) | ORAL | 1 refills | Status: DC

## 2021-10-07 NOTE — Telephone Encounter (Signed)
*  STAT* If patient is at the pharmacy, call can be transferred to refill team.   1. Which medications need to be refilled? (please list name of each medication and dose if known)  sacubitril-valsartan (ENTRESTO) 49-51 MG  2. Which pharmacy/location (including street and city if local pharmacy) is medication to be sent to? [place], [place] - [address] AT [place]  3. Do they need a 30 day or 90 day supply?   90 day supply  Patient states he is completely out of medication

## 2021-10-07 NOTE — Telephone Encounter (Signed)
Refills has been sent to the pharmacy. 

## 2021-10-09 ENCOUNTER — Telehealth: Payer: Self-pay | Admitting: Cardiovascular Disease

## 2021-10-09 MED ORDER — DAPAGLIFLOZIN PROPANEDIOL 10 MG PO TABS: 10.0000 mg | ORAL_TABLET | Freq: Every day | ORAL | 5 refills | Status: DC

## 2021-10-09 NOTE — Telephone Encounter (Signed)
Refills has been sent to the pharmacy. 

## 2021-10-09 NOTE — Telephone Encounter (Signed)
*  STAT* If patient is at the pharmacy, call can be transferred to refill team.   1. Which medications need to be refilled? (please list name of each medication and dose if known) dapagliflozin propanediol (FARXIGA) 10 MG TABS tablet  2. Which pharmacy/location (including street and city if local pharmacy) is medication to be sent to?[place], [place] - [address] AT [place]  3. Do they need a 30 day or 90 day supply? 30 day   Patient is out of medication

## 2021-10-17 ENCOUNTER — Ambulatory Visit (INDEPENDENT_AMBULATORY_CARE_PROVIDER_SITE_OTHER): Payer: No Typology Code available for payment source

## 2021-10-17 DIAGNOSIS — Z95810 Presence of automatic (implantable) cardiac defibrillator: Secondary | ICD-10-CM | POA: Diagnosis not present

## 2021-10-17 LAB — CUP PACEART REMOTE DEVICE CHECK
Battery Remaining Longevity: 62 mo
Battery Voltage: 2.98 V
Brady Statistic AP VP Percent: 0.02 %
Brady Statistic AP VS Percent: 13.25 %
Brady Statistic AS VP Percent: 0.04 %
Brady Statistic AS VS Percent: 86.69 %
Brady Statistic RA Percent Paced: 13.21 %
Brady Statistic RV Percent Paced: 0.06 %
Date Time Interrogation Session: 20221103022825
HighPow Impedance: 35 Ohm
HighPow Impedance: 46 Ohm
Implantable Lead Implant Date: 20051120
Implantable Lead Implant Date: 20101117
Implantable Lead Location: 753859
Implantable Lead Location: 753860
Implantable Lead Model: 5076
Implantable Lead Model: 7121
Implantable Pulse Generator Implant Date: 20180105
Lead Channel Impedance Value: 247 Ohm
Lead Channel Impedance Value: 342 Ohm
Lead Channel Impedance Value: 361 Ohm
Lead Channel Pacing Threshold Amplitude: 0.5 V
Lead Channel Pacing Threshold Amplitude: 1 V
Lead Channel Pacing Threshold Pulse Width: 0.4 ms
Lead Channel Pacing Threshold Pulse Width: 0.4 ms
Lead Channel Sensing Intrinsic Amplitude: 10.125 mV
Lead Channel Sensing Intrinsic Amplitude: 10.125 mV
Lead Channel Sensing Intrinsic Amplitude: 2.5 mV
Lead Channel Sensing Intrinsic Amplitude: 2.5 mV
Lead Channel Setting Pacing Amplitude: 1.5 V
Lead Channel Setting Pacing Amplitude: 2 V
Lead Channel Setting Pacing Pulse Width: 0.4 ms
Lead Channel Setting Sensing Sensitivity: 0.6 mV

## 2021-10-23 NOTE — Progress Notes (Signed)
Remote ICD transmission.   

## 2021-10-24 ENCOUNTER — Encounter: Payer: Self-pay | Admitting: Neurology

## 2021-11-29 ENCOUNTER — Other Ambulatory Visit: Payer: Self-pay | Admitting: Student

## 2021-12-02 ENCOUNTER — Other Ambulatory Visit: Payer: Self-pay | Admitting: *Deleted

## 2021-12-02 MED ORDER — POTASSIUM CHLORIDE CRYS ER 10 MEQ PO TBCR: 10.0000 meq | EXTENDED_RELEASE_TABLET | Freq: Every day | ORAL | 6 refills | Status: DC

## 2022-01-07 ENCOUNTER — Other Ambulatory Visit: Payer: Self-pay

## 2022-01-07 MED ORDER — AMIODARONE HCL 200 MG PO TABS: 200.0000 mg | ORAL_TABLET | Freq: Every day | ORAL | 1 refills | Status: DC

## 2022-01-12 ENCOUNTER — Other Ambulatory Visit: Payer: Self-pay | Admitting: Cardiovascular Disease

## 2022-01-13 NOTE — Telephone Encounter (Signed)
Prescription refill request for Eliquis received. Indication:Afib Last office visit:7/22 Scr:0.9 Age: 61 Weight:105.1 kg  Prescription refilled

## 2022-01-16 ENCOUNTER — Ambulatory Visit (INDEPENDENT_AMBULATORY_CARE_PROVIDER_SITE_OTHER): Payer: No Typology Code available for payment source

## 2022-01-16 DIAGNOSIS — I5022 Chronic systolic (congestive) heart failure: Secondary | ICD-10-CM | POA: Diagnosis not present

## 2022-01-16 LAB — CUP PACEART REMOTE DEVICE CHECK
Battery Remaining Longevity: 59 mo
Battery Voltage: 2.98 V
Brady Statistic AP VP Percent: 0.03 %
Brady Statistic AP VS Percent: 14.63 %
Brady Statistic AS VP Percent: 0.05 %
Brady Statistic AS VS Percent: 85.29 %
Brady Statistic RA Percent Paced: 14.56 %
Brady Statistic RV Percent Paced: 0.09 %
Date Time Interrogation Session: 20230202022823
HighPow Impedance: 39 Ohm
HighPow Impedance: 52 Ohm
Implantable Lead Implant Date: 20051120
Implantable Lead Implant Date: 20101117
Implantable Lead Location: 753859
Implantable Lead Location: 753860
Implantable Lead Model: 5076
Implantable Lead Model: 7121
Implantable Pulse Generator Implant Date: 20180105
Lead Channel Impedance Value: 247 Ohm
Lead Channel Impedance Value: 361 Ohm
Lead Channel Impedance Value: 399 Ohm
Lead Channel Pacing Threshold Amplitude: 0.5 V
Lead Channel Pacing Threshold Amplitude: 1 V
Lead Channel Pacing Threshold Pulse Width: 0.4 ms
Lead Channel Pacing Threshold Pulse Width: 0.4 ms
Lead Channel Sensing Intrinsic Amplitude: 2.875 mV
Lead Channel Sensing Intrinsic Amplitude: 2.875 mV
Lead Channel Sensing Intrinsic Amplitude: 9.125 mV
Lead Channel Sensing Intrinsic Amplitude: 9.125 mV
Lead Channel Setting Pacing Amplitude: 1.5 V
Lead Channel Setting Pacing Amplitude: 2.5 V
Lead Channel Setting Pacing Pulse Width: 0.4 ms
Lead Channel Setting Sensing Sensitivity: 0.6 mV

## 2022-01-22 NOTE — Progress Notes (Signed)
Remote ICD transmission.   

## 2022-02-25 ENCOUNTER — Telehealth: Payer: Self-pay

## 2022-02-25 NOTE — Telephone Encounter (Signed)
MDT alert for sustained VT with therapy ?3 of 4 sustained VT events successfully pace terminated.  3/13 @ 10:20, ATPx1, 22:30 ATPx3, 22:31 ATPx1, HR's 188-200 ?Event 3/13 @ 22:31, sustained VT, ATPx8 without resolution, 35J high voltage therapy converting rhythm regular AS/VP ?5 NSVT events showing regular 1:1, HR's 170's-200 ?Optivol crossed threshold 3/12 and is ongoing ?Presenting rhythm is regular, tachy 1:1 with a run of NSVT 7 beats, HR ~180 ?Route to triage ?LA ? ? ?Successful telephone encounter to patient to follow up on s/s VT/VF with multiple ATP therapies and successful shock x 1. Patient states he went to the gym around 11 yesterday and exercised without symptoms. Aware of intermittant palpitations prior to which coincides with NSVT at 10:11 am. States he was watching TV during 2231 event. Aware of palpitations and became "lightheaded". Does not remember a shock and also does not believe he lost consciousness. He endorses taking all meds as prescribed including Amio '200mg'$  daily, toprol xl '25mg'$  in the am and 50 mg pm, and eliquis 5 mg bid. Does have a history of AF with RVR. Will discuss with Dr. Caryl Comes. Patient is advised of [place] driving restrictions and shock plan. Will advise patient once Dr. Caryl Comes has reviewed.  ? ? ? ? ? ? ? ? ? ? ? ? ? ? ? ? ? ? ? ? ? ? ? ?

## 2022-02-25 NOTE — Telephone Encounter (Signed)
Reviewed transmission and EGMs with Dr. Caryl Comes. Recommended in office follow up appointment and to increase amiodarone to '400mg'$  BID x 2 weeks. Patient is scheduled to see EP MD Thursday 02/27/22 at 3:30 pm. Successful telephone encounter to patient to advise. Patient able to verbalize increase in amio dosage as stated above. [place] driving restrictions and shock plan with ED precautions reinforced. Patient verbalizes understanding and confirmed appointment time and date.  ?

## 2022-02-27 ENCOUNTER — Encounter: Payer: Self-pay | Admitting: Internal Medicine

## 2022-02-27 ENCOUNTER — Other Ambulatory Visit: Payer: Self-pay

## 2022-02-27 ENCOUNTER — Ambulatory Visit (INDEPENDENT_AMBULATORY_CARE_PROVIDER_SITE_OTHER): Payer: No Typology Code available for payment source | Admitting: Internal Medicine

## 2022-02-27 VITALS — BP 110/70 | HR 68 | Ht 78.0 in | Wt 222.4 lb

## 2022-02-27 DIAGNOSIS — I255 Ischemic cardiomyopathy: Secondary | ICD-10-CM

## 2022-02-27 DIAGNOSIS — I480 Paroxysmal atrial fibrillation: Secondary | ICD-10-CM | POA: Diagnosis not present

## 2022-02-27 DIAGNOSIS — I472 Ventricular tachycardia, unspecified: Secondary | ICD-10-CM | POA: Diagnosis not present

## 2022-02-27 DIAGNOSIS — Z79899 Other long term (current) drug therapy: Secondary | ICD-10-CM

## 2022-02-27 DIAGNOSIS — I5042 Chronic combined systolic (congestive) and diastolic (congestive) heart failure: Secondary | ICD-10-CM

## 2022-02-27 DIAGNOSIS — Z95810 Presence of automatic (implantable) cardiac defibrillator: Secondary | ICD-10-CM

## 2022-02-27 NOTE — Progress Notes (Signed)
? ? ? ? ?Patient Care Team: ?Patient, [name] as PCP - General (General Practice) ?Croitoru, Dani Gobble, MD as PCP - Cardiology (Cardiology) ?[name], MD as PCP - Electrophysiology (Cardiology) ?[name], MD as Consulting Physician (Neurology) ? ? ?HPI ? ?Gregory [name] is a 61 y.o. male ?Seen in followup for ventricular tachycardia associated with a previously implanted ICD in the context of congestive heart failure ischemic cardiomyopathy. Ejection fraction echo 2014--45-50% and Myoview 2010 extensive inferolateral scar. He had a type I have talked with  underwent ICD generator replacement 1/18 10/21 developed atrial fibrillation anticoagulated with  Eliquis ? ?He presented 5/17 and  found to be in sustained monomorphic ventricular tachycardia rate of 160 below his detection rate. His device was reprogrammed to 150. ? ?Reepeated episodes of VT most recently 3/23; also 9/22 and 1/22- ? ?  ?DATE TEST EF   ?3/14 Echo   45-50 % LVH mod/ BAE  ?5/17 LHC   LADm-stent-p;D1-stent-p; CxOM1-stent-p;RCAp-T, collaterals  ?5/17 Echo  60-65%   ?6/19 Echo  60-65% LAE (56/2.1/45)  ?10/21 Echo  40-45%   ?10/21 LHC  Patent stents   ? ? ? ?He's been managed by combination of amiodarone and mexiletine, the latter stopped 2/2 flatus amiodarone was just uptitrated in light of the episodes 3/23. ?  ?Date TSH LFTs Cr K Hgb  ?5/17       3.3   ?9/17  2.24 21  5.1   ?7/19 1.408 16 1.08 4.0 11.4<<8.4  ?1/21 0.643  0.87 4.2 14.9  ?7/22       ? ?Has a history of seizures ventricular tachycardia ?Antiarrhythmics Date Reason stopped  ?amiodarone   Ele Alk phos  ?mexilitene Stopped   flatus  ? ?  ? ?  ?  ? ? ?Past Medical History:  ?Diagnosis Date  ? CAD (coronary artery disease) 12/01/2013  ? Chronic combined systolic and diastolic CHF, NYHA class 1 ([name]) 12/01/2013  ? Erectile dysfunction 12/01/2013  ? HTN (hypertension) 12/01/2013  ? Hyperlipidemia 12/01/2013  ? Ischemic cardiomyopathy 12/01/2013  ? Sleep  apnea   ? ? ?Past Surgical History:  ?Procedure Laterality Date  ? ACHILLES TENDON REPAIR    ? lft foot  ? CARDIAC CATHETERIZATION N/A 05/05/2016  ? Procedure: Left Heart Cath and Coronary Angiography;  Surgeon: Leonie Man, MD;  Location: [facility];  Service: Cardiovascular;  Laterality: N/A;  ? CARDIAC DEFIBRILLATOR PLACEMENT    ? CARDIOVERSION N/A 09/13/2020  ? Procedure: CARDIOVERSION;  Surgeon: Werner Lean, MD;  Location: [facility];  Service: Cardiovascular;  Laterality: N/A;  ? CARDIOVERSION N/A 09/19/2020  ? Procedure: CARDIOVERSION;  Surgeon: [name], MD;  Location: [facility];  Service: Cardiovascular;  Laterality: N/A;  ? EP IMPLANTABLE DEVICE N/A 12/19/2016  ? Procedure: ICD Generator Changeout;  Surgeon: [name], MD;  Location: [facility];  Service: Cardiovascular;  Laterality: N/A;  ? RIGHT/LEFT HEART CATH AND CORONARY ANGIOGRAPHY N/A 09/24/2020  ? Procedure: RIGHT/LEFT HEART CATH AND CORONARY ANGIOGRAPHY;  Surgeon: Lorretta Harp, MD;  Location: [facility];  Service: Cardiovascular;  Laterality: N/A;  ? WRIST TENODESIS    ? ? ?Current Outpatient Medications  ?Medication Sig Dispense Refill  ? amiodarone (PACERONE) 200 MG tablet Take 1 tablet (200 mg total) by mouth daily. (Patient taking differently: Take 400 mg by mouth 2 (two) times daily.) 90 tablet 1  ? atorvastatin (LIPITOR) 80 MG tablet Take 1 tablet (80 mg  total) by mouth every evening. 90 tablet 3  ? clopidogrel (PLAVIX) 75 MG tablet TAKE 1 TABLET(75 MG) BY MOUTH DAILY 90 tablet 2  ? dapagliflozin propanediol (FARXIGA) 10 MG TABS tablet Take 1 tablet (10 mg total) by mouth daily before breakfast. 30 tablet 5  ? ELIQUIS 5 MG TABS tablet TAKE 1 TABLET(5 MG) BY MOUTH TWICE DAILY 180 tablet 1  ? ezetimibe (ZETIA) 10 MG tablet Take 1 tablet (10 mg total) by mouth daily. 90 tablet 3  ? furosemide (LASIX) 20 MG tablet Take 1 tablet (20 mg total) by mouth 3 (three) times a week. 15 tablet 3  ?  levETIRAcetam (KEPPRA) 500 MG tablet TAKE 1 TABLET(500 MG) BY MOUTH TWICE DAILY 60 tablet 2  ? melatonin 5 MG TABS Take 1 tablet (5 mg total) by mouth at bedtime. 30 tablet 0  ? metoprolol succinate (TOPROL-XL) 25 MG 24 hr tablet Take one tablet, 25 mg, in the morning and 50 mg, two tablets, in the evening. 135 tablet 3  ? oxyCODONE (OXY IR/ROXICODONE) 5 MG immediate release tablet Take 1 tablet (5 mg total) by mouth every 12 (twelve) hours as needed for severe pain. 10 tablet 0  ? pantoprazole (PROTONIX) 40 MG tablet TAKE 1 TABLET(40 MG) BY MOUTH DAILY 30 tablet 11  ? potassium chloride (KLOR-CON M) 10 MEQ tablet Take 1 tablet (10 mEq total) by mouth daily. 30 tablet 6  ? sacubitril-valsartan (ENTRESTO) 49-51 MG Take 1 tablet by mouth 2 (two) times daily. 180 tablet 1  ? sildenafil (VIAGRA) 50 MG tablet Take 1 tablet (50 mg total) by mouth as needed for erectile dysfunction. 30 tablet 4  ? traZODone (DESYREL) 100 MG tablet TAKE 1 TABLET(100 MG) BY MOUTH AT BEDTIME 90 tablet 1  ? ?No current facility-administered medications for this visit.  ? ?  ? ?No Known Allergies ? ?Review of Systems negative except from HPI and PMH ? ?Physical Exam ?BP 110/70 (BP Location: Left Arm, Patient Position: Sitting, Cuff Size: Large)   Pulse 68   Ht _0  (1.981 m)   Wt 222 lb 6.4 oz (100.9 kg)   SpO2 98%   BMI 25.70 kg/m?  ?Well developed and well nourished in no acute distress ?HENT normal ?Neck supple with JVP-flat ?Clear ?Device pocket well healed; without hematoma or erythema.  There is no tethering  ?Regular rate and rhythm, no  murmur ?Abd-soft with active BS ?No Clubbing cyanosis  edema ?Skin-warm and dry ?A & Oriented  Grossly normal sensory and motor function ? ?ECG atrial pacing at 68 ?Intervals 19/12/45 ?Inferior wall MI ?  ? ?Assessment and plan ?PVCs infrequent ? ?Ventricular tachycardia recurrent treated with ATP ? ?ICD  medtronic   ? ?Ischemic Cardiomyopathy   ? ?CHF chronic systolic   ? ?Seizure  disorder ? ?Supraventricular tachycardia-nonsustained (atrial tachycardia) ? ?Psychosocial stress  ? ?elevated alk phos ? ?Amiodarone on hold secondary to elevated alk phos ? ?Continue problems with ventricular tachycardia.  There seems to be a correlation with OptiVol, so we will push his diuretics a little bit.  Dr. Thermon Leyland and reduce his Diuretics in the past, so I clarify with him increasing his diuretics which we will do having him take 40 mg every other day for total of 5 days and then resume his 20 mg a day.  His OptiVol is elevated as noted ? ?He is not inclined towards catheter ablation at this time.  Because of the correlation with volume, we will hold off on adjunctive  antiarrhythmic therapy specifically ranolazine.  Continue amiodarone at 400 mg twice daily times another 10 days and 200 mg twice daily for 2 weeks then back to 200 mg a day. ?   ?He needs amiodarone surveillance laboratories. ? ?VT occurred in the wake of having exercise vigorously.  There is some possibility that this could also have been triggered by ischemia, and supported that hypothesis is that there is some RR irregularity suggesting not a stable reentrant circuit.  This would be another justification for the ranolazine; however again given the association with fluid we will hold off. ? ? ? ? ?  ? ?

## 2022-02-27 NOTE — Patient Instructions (Signed)
Medication Instructions:  ?Your physician recommends that you continue on your current medications as directed. Please refer to the Current Medication list given to you today. ? ?** Take Furosemide '20mg'$  - 2 tablets ('40mg'$ ) on Friday, Saturday and Monday then return to your normal dosing of Furosemide ('20mg'$ ) 1 tablet by mouth on Wednesday going forward. ? ?*If you need a refill on your cardiac medications before your next appointment, please call your pharmacy* ? ? ?Lab Work: ?BMET - pt will call to schedule lab appointment ?If you have labs (blood work) drawn today and your tests are completely normal, you will receive your results only by: ?MyChart Message (if you have MyChart) OR ?A paper copy in the mail ?If you have any lab test that is abnormal or we need to change your treatment, we will call you to review the results. ? ? ?Testing/Procedures: ?None ordered. ? ? ? ?Follow-Up: ?At [facility], you and your health needs are our priority.  As part of our continuing mission to provide you with exceptional heart care, we have created designated Provider Care Teams.  These Care Teams include your primary Cardiologist (physician) and Advanced Practice Providers (APPs -  Physician Assistants and Nurse Practitioners) who all work together to provide you with the care you need, when you need it. ? ?We recommend signing up for the patient portal called "MyChart".  Sign up information is provided on this After Visit Summary.  MyChart is used to connect with patients for Virtual Visits (Telemedicine).  Patients are able to view lab/test results, encounter notes, upcoming appointments, etc.  Non-urgent messages can be sent to your provider as well.   ?To learn more about what you can do with MyChart, go to [email or web address].   ? ?Your next appointment:   ? ?6 months with Dr Caryl Comes ? ?

## 2022-02-28 ENCOUNTER — Other Ambulatory Visit: Payer: No Typology Code available for payment source

## 2022-02-28 ENCOUNTER — Other Ambulatory Visit: Payer: Worker's Compensation | Admitting: *Deleted

## 2022-02-28 DIAGNOSIS — Z95810 Presence of automatic (implantable) cardiac defibrillator: Secondary | ICD-10-CM

## 2022-02-28 DIAGNOSIS — I472 Ventricular tachycardia, unspecified: Secondary | ICD-10-CM

## 2022-02-28 DIAGNOSIS — I480 Paroxysmal atrial fibrillation: Secondary | ICD-10-CM

## 2022-02-28 DIAGNOSIS — I5042 Chronic combined systolic (congestive) and diastolic (congestive) heart failure: Secondary | ICD-10-CM

## 2022-02-28 DIAGNOSIS — I255 Ischemic cardiomyopathy: Secondary | ICD-10-CM

## 2022-02-28 DIAGNOSIS — Z79899 Other long term (current) drug therapy: Secondary | ICD-10-CM

## 2022-03-01 LAB — COMPREHENSIVE METABOLIC PANEL
ALT: 8 IU/L (ref 0–44)
AST: 16 IU/L (ref 0–40)
Albumin/Globulin Ratio: 1.6 (ref 1.2–2.2)
Albumin: 4 g/dL (ref 3.8–4.8)
Alkaline Phosphatase: 69 IU/L (ref 44–121)
BUN/Creatinine Ratio: 14 (ref 10–24)
BUN: 12 mg/dL (ref 8–27)
Bilirubin Total: 0.2 mg/dL (ref 0.0–1.2)
CO2: 22 mmol/L (ref 20–29)
Calcium: 8.2 mg/dL — ABNORMAL LOW (ref 8.6–10.2)
Chloride: 104 mmol/L (ref 96–106)
Creatinine, Ser: 0.84 mg/dL (ref 0.76–1.27)
Globulin, Total: 2.5 g/dL (ref 1.5–4.5)
Glucose: 94 mg/dL (ref 70–99)
Potassium: 3.8 mmol/L (ref 3.5–5.2)
Sodium: 141 mmol/L (ref 134–144)
Total Protein: 6.5 g/dL (ref 6.0–8.5)
eGFR: 98 mL/min/{1.73_m2} (ref >59)

## 2022-03-01 LAB — TSH: TSH: 4.11 u[IU]/mL (ref 0.450–4.500)

## 2022-03-07 ENCOUNTER — Telehealth: Payer: Self-pay | Admitting: Cardiovascular Disease

## 2022-03-07 NOTE — Telephone Encounter (Signed)
?*  STAT* If patient is at the pharmacy, call can be transferred to refill team. ? ? ?1. Which medications need to be refilled? (please list name of each medication and dose if known)  ?pantoprazole (PROTONIX) 40 MG tablet   ezetimibe (ZETIA) 10 MG tablet  ? ?2. Which pharmacy/location (including street and city if local pharmacy) is medication to be sent to? ?[id number] - [place], [place] AT [address] ? ?3. Do they need a 30 day or 90 day supply?  ? 90 day supply ? ?Patient states he is completely out of Zetia. ? ?

## 2022-03-10 ENCOUNTER — Encounter: Payer: Self-pay | Admitting: Internal Medicine

## 2022-03-10 ENCOUNTER — Telehealth: Payer: Self-pay

## 2022-03-10 ENCOUNTER — Other Ambulatory Visit: Payer: Self-pay

## 2022-03-10 MED ORDER — AMIODARONE HCL 200 MG PO TABS: 200.0000 mg | ORAL_TABLET | Freq: Every day | ORAL | 1 refills | Status: DC

## 2022-03-10 NOTE — Telephone Encounter (Signed)
February 25, 2022 ?Benedetto Goad, RN ? ?Note ?Reviewed transmission and EGMs with Dr. Caryl Comes. Recommended in office follow up appointment and to increase amiodarone to '400mg'$  BID x 2 weeks. Patient is scheduled to see EP MD Thursday 02/27/22 at 3:30 pm. Successful telephone encounter to patient to advise. Patient able to verbalize increase in amio dosage as stated above. [place] driving restrictions and shock plan with ED precautions reinforced. Patient verbalizes understanding and confirmed appointment time and date.  ?  ? ?

## 2022-03-10 NOTE — Telephone Encounter (Signed)
Remote transmission reviewed. Normal device function. Presenting rhythm ~ AS/VS 78 bpm. VT/NSVT events noted with 4 successful ATP events. Optivol also reflects fluid retention. Followed up with Rosann Auerbach who states she will f/u with patient.   ? ? ? ? ? ? ? ? ? ? ? ? ? ? ? ? ? ? ? ? ?

## 2022-03-10 NOTE — Telephone Encounter (Signed)
Transmission received.

## 2022-03-10 NOTE — Telephone Encounter (Signed)
Good day ladies ?A couple of things ?1) the last strip shows VT below the detection, so we will need to have him come him and reprogram his VT detection rate down about 30 bpm ?2) can we schedule him to see GT for eval for VT ablation ?3) lets have him double his AM furosemide dose for the next week and retransmit and check his optivol ? ?Thanks SK   ?

## 2022-03-10 NOTE — Telephone Encounter (Signed)
Spoke with pt who reports he has been taking medications as prescribed.  He did take the extra Furosemide previously prescribed by Dr Caryl Comes on 02/27/2022 of Furosemide '40mg'$  x 3 days then returned to normal dose of Furosemide.  Pt states he did have some increase in urination with some minimal improvement in SOB.  ?Pt states he is open to discussing ablation with provider Dr Caryl Comes recommends.  ?Pt advised we will have Dr Caryl Comes review his remote transmission for further recommendations.   ? ? ? ?Copied from pt's MyChart message today. ?Dr. Caryl Comes, ?I have to go up 14 stairs to get to my bedroom from the front door. by the time i get to the bedroom i get winded, my thighs start to feel weak. and my heart rate goes up. it will average 118-to 127 and stay that way for hours some days. when it beats that way i can feel it. sometimes almost like it's beating at the top of stomach and i can feel my breathing pattern change. the getting winded is a lot like an out of shape kind of winded, not the serious shortness of breath. if i walk about 200 feet i start to feel the same way. most of the time. if i sit still for about 30 seconds everything goes back to normal. Saturday and sunday night my heart rate stayed elevated for hours. saturday night at about 9pm or so  it raised to the point where i got light headed. the same thing happened sunday night at almost the same time. i was just standing  in the kitchen, washing dishes  both times.  i felt like the saturday night one was worse than the sunday night one.  i sent a transmission to the device clinic at about 9am today. ?

## 2022-03-10 NOTE — Telephone Encounter (Signed)
Have spoken with the pt.  See telephone note 03/10/2022 for complete details and awaiting recommendations from Dr Caryl Comes. ?

## 2022-03-10 NOTE — Telephone Encounter (Signed)
The patient states his heart rate was so high Saturday,  and Sunday night that he felt like he was going to pass out. I had him send a transmission for the nurse to review. I told him once the nurse see the transmission she will give him a call. ?

## 2022-03-10 NOTE — Telephone Encounter (Signed)
Pt calling stating that his medication amiodarone 200 mg tablets was changed by Dr. Caryl Comes at last office visit. This medication change was not sent to pt's pharmacy with change, now pt is out of medication. Please address ?

## 2022-03-10 NOTE — Telephone Encounter (Signed)
Pharmacy notified of temporary dose increase. ?

## 2022-03-11 MED ORDER — FUROSEMIDE 20 MG PO TABS: 40.0000 mg | ORAL_TABLET | ORAL | 1 refills | Status: DC

## 2022-03-11 NOTE — Telephone Encounter (Signed)
Spoke with pt and advised per Dr Caryl Comes increase Furosemide to '40mg'$  (2 tablets) by mouth daily x 3 days then begin Furosemide '40mg'$  (2 tablets) by mouth 3x per week.  Pt advised Dr Olin Pia scheduler will contact him re: appointment to see Dr Lovena Le.  Pt verbalizes understanding and agrees with current plan. ?

## 2022-03-11 NOTE — Telephone Encounter (Signed)
Device Clinic apt. Made 03/12/22. Advised Rosann Auerbach will be in contact about further test. ?

## 2022-03-11 NOTE — Addendum Note (Signed)
Addended by: Thora Lance on: 03/11/2022 09:34 AM ? ? Modules accepted: Orders ? ?

## 2022-03-12 ENCOUNTER — Ambulatory Visit (INDEPENDENT_AMBULATORY_CARE_PROVIDER_SITE_OTHER): Payer: Worker's Compensation

## 2022-03-12 DIAGNOSIS — I255 Ischemic cardiomyopathy: Secondary | ICD-10-CM

## 2022-03-12 DIAGNOSIS — I472 Ventricular tachycardia, unspecified: Secondary | ICD-10-CM

## 2022-03-12 LAB — CUP PACEART INCLINIC DEVICE CHECK
Battery Remaining Longevity: 48 mo
Battery Voltage: 2.96 V
Brady Statistic AP VP Percent: 0.02 %
Brady Statistic AP VS Percent: 10.75 %
Brady Statistic AS VP Percent: 0.06 %
Brady Statistic AS VS Percent: 89.16 %
Brady Statistic RA Percent Paced: 10.6 %
Brady Statistic RV Percent Paced: 0.09 %
Date Time Interrogation Session: 20230329155733
HighPow Impedance: 35 Ohm
HighPow Impedance: 44 Ohm
Implantable Lead Implant Date: 20051120
Implantable Lead Implant Date: 20101117
Implantable Lead Location: 753859
Implantable Lead Location: 753860
Implantable Lead Model: 5076
Implantable Lead Model: 7121
Implantable Pulse Generator Implant Date: 20180105
Lead Channel Impedance Value: 247 Ohm
Lead Channel Impedance Value: 342 Ohm
Lead Channel Impedance Value: 361 Ohm
Lead Channel Pacing Threshold Amplitude: 0.625 V
Lead Channel Pacing Threshold Amplitude: 1 V
Lead Channel Pacing Threshold Pulse Width: 0.4 ms
Lead Channel Pacing Threshold Pulse Width: 0.4 ms
Lead Channel Sensing Intrinsic Amplitude: 10.875 mV
Lead Channel Sensing Intrinsic Amplitude: 10.875 mV
Lead Channel Sensing Intrinsic Amplitude: 2.5 mV
Lead Channel Sensing Intrinsic Amplitude: 2.5 mV
Lead Channel Setting Pacing Amplitude: 1.5 V
Lead Channel Setting Pacing Amplitude: 2.25 V
Lead Channel Setting Pacing Pulse Width: 0.4 ms
Lead Channel Setting Sensing Sensitivity: 0.6 mV

## 2022-03-12 NOTE — Progress Notes (Signed)
ICD check in clinic for lowering of VT detection interval zone lowered from 150bpm to 136bpm. ROV with GT 03/21/22 for consideration of VT ablation. ?

## 2022-03-13 ENCOUNTER — Other Ambulatory Visit: Payer: Self-pay

## 2022-03-13 MED ORDER — PANTOPRAZOLE SODIUM 40 MG PO TBEC
DELAYED_RELEASE_TABLET | ORAL | 2 refills | Status: DC
Start: 2022-03-13 — End: 2022-06-06

## 2022-03-13 MED ORDER — EZETIMIBE 10 MG PO TABS: 10.0000 mg | ORAL_TABLET | Freq: Every day | ORAL | 3 refills | Status: DC

## 2022-03-13 NOTE — Telephone Encounter (Signed)
Refill sent to pharmacy.   

## 2022-03-13 NOTE — Telephone Encounter (Signed)
?*  STAT* If patient is at the pharmacy, call can be transferred to refill team. ? ? ?1. Which medications need to be refilled? (please list name of each medication and dose if known) Ezetimibe and ? Pantoprazole ? ?2. Which pharmacy/location (including street and city if local pharmacy) is medication to be sent to? [facility] [place] and [address], [place] ? ?3. Do they need a 30 day or 90 day supply? Please call today- completely out of them for 2 days ? ?

## 2022-03-18 ENCOUNTER — Ambulatory Visit: Payer: No Typology Code available for payment source | Admitting: Neurology

## 2022-03-20 ENCOUNTER — Ambulatory Visit: Payer: No Typology Code available for payment source | Admitting: Neurology

## 2022-03-21 ENCOUNTER — Encounter: Payer: Self-pay | Admitting: Internal Medicine

## 2022-03-21 ENCOUNTER — Ambulatory Visit (INDEPENDENT_AMBULATORY_CARE_PROVIDER_SITE_OTHER): Payer: No Typology Code available for payment source | Admitting: Internal Medicine

## 2022-03-21 VITALS — BP 114/80 | HR 71 | Ht 78.0 in | Wt 219.4 lb

## 2022-03-21 DIAGNOSIS — Z95810 Presence of automatic (implantable) cardiac defibrillator: Secondary | ICD-10-CM

## 2022-03-21 DIAGNOSIS — I472 Ventricular tachycardia, unspecified: Secondary | ICD-10-CM | POA: Diagnosis not present

## 2022-03-21 DIAGNOSIS — I255 Ischemic cardiomyopathy: Secondary | ICD-10-CM | POA: Diagnosis not present

## 2022-03-21 NOTE — Patient Instructions (Addendum)
Medication Instructions:  ?Your physician recommends that you continue on your current medications as directed. Please refer to the Current Medication list given to you today. ? ?Labwork: ?None ordered. ? ?Testing/Procedures: ?None ordered. ? ?Follow-Up: ?Dr. Lovena Le will discuss your case with Dr. Caryl Comes and we will get back to you. ? ?Remote monitoring is used to monitor your ICD from home. This monitoring reduces the number of office visits required to check your device to one time per year. It allows [place] to keep an eye on the functioning of your device to ensure it is working properly. You are scheduled for a device check from home on 04/17/2022. You may send your transmission at any time that day. If you have a wireless device, the transmission will be sent automatically. After your physician reviews your transmission, you will receive a postcard with your next transmission date. ? ?Any Other Special Instructions Will Be Listed Below (If Applicable). ? ?If you need a refill on your cardiac medications before your next appointment, please call your pharmacy.  ? ? ? ? ?

## 2022-03-21 NOTE — Progress Notes (Addendum)
? ? ? ? ?HPI ?[name] is referred by Dr. Caryl Comes for consideration for VT ablation.  The patient is a very pleasant 61 year old man with an ischemic cardiomyopathy.  He has a history of out of hospital MI close to 10 years ago.  He has an extensive inferolateral scar.  His ejection fraction is only moderately decreased, around 45% with optimal medical therapy.  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  He has been on amiodarone 200 mg daily since then.  He has had worsening congestive heart failure symptoms over the last several weeks.  The patient's had recurrent episodes of ventricular tachycardia.  Review of the patient's ICD interrogation demonstrates 2 additional VT morphologies and right.  A VT at 188 bpm as well as a VT of almost 300 bpm.  The slower VT was paced terminable.  The faster VT resulted in ICD shock.  He has actually had several shocks.  The patient was placed on 400 mg twice a day of amiodarone by Dr. Caryl Comes.  He feels poorly though I am not sure this is related to the amiodarone.  Review of his only twelve-lead EKG of ventricular tachycardia from 2017 demonstrates a right bundle branch block VT with a northwest axis.  The transition from positive to negative was at lead V2.  The above findings suggest a lateral infarction near the apex.  His most recent VT's do not have a twelve-lead associated. ?No Known Allergies ? ? ?Current Outpatient Medications  ?Medication Sig Dispense Refill  ? amiodarone (PACERONE) 200 MG tablet Take 1 tablet (200 mg total) by mouth daily. 90 tablet 1  ? atorvastatin (LIPITOR) 80 MG tablet Take 1 tablet (80 mg total) by mouth every evening. 90 tablet 3  ? clopidogrel (PLAVIX) 75 MG tablet TAKE 1 TABLET(75 MG) BY MOUTH DAILY 90 tablet 2  ? dapagliflozin propanediol (FARXIGA) 10 MG TABS tablet Take 1 tablet (10 mg total) by mouth daily before breakfast. 30 tablet 5  ? ELIQUIS 5 MG TABS tablet TAKE 1  TABLET(5 MG) BY MOUTH TWICE DAILY 180 tablet 1  ? ezetimibe (ZETIA) 10 MG tablet Take 1 tablet (10 mg total) by mouth daily. 90 tablet 3  ? furosemide (LASIX) 20 MG tablet Take 2 tablets (40 mg total) by mouth 3 (three) times a week. 80 tablet 1  ? levETIRAcetam (KEPPRA) 500 MG tablet TAKE 1 TABLET(500 MG) BY MOUTH TWICE DAILY 60 tablet 2  ? melatonin 5 MG TABS Take 1 tablet (5 mg total) by mouth at bedtime. 30 tablet 0  ? metoprolol succinate (TOPROL-XL) 25 MG 24 hr tablet Take one tablet, 25 mg, in the morning and 50 mg, two tablets, in the evening. (Patient taking differently: 25 mg. Per patient taking 25 mg in the morning and 25 mg at night) 135 tablet 3  ? oxyCODONE (OXY IR/ROXICODONE) 5 MG immediate release tablet Take 1 tablet (5 mg total) by mouth every 12 (twelve) hours as needed for severe pain. 10 tablet 0  ? pantoprazole (PROTONIX) 40 MG tablet TAKE 1 TABLET(40 MG) BY MOUTH DAILY 30 tablet 2  ? potassium chloride (KLOR-CON M) 10 MEQ tablet Take 1 tablet (10 mEq total) by mouth daily. 30 tablet 6  ? sacubitril-valsartan (ENTRESTO) 49-51 MG Take 1 tablet by mouth 2 (two) times daily. 180 tablet 1  ? sildenafil (VIAGRA) 50 MG tablet Take 1 tablet (50 mg total) by mouth as needed for erectile dysfunction. 30 tablet 4  ?  traZODone (DESYREL) 100 MG tablet TAKE 1 TABLET(100 MG) BY MOUTH AT BEDTIME (Patient not taking: Reported on 03/21/2022) 90 tablet 1  ? ?No current facility-administered medications for this visit.  ? ? ? ?Past Medical History:  ?Diagnosis Date  ? CAD (coronary artery disease) 12/01/2013  ? Chronic combined systolic and diastolic CHF, NYHA class 1 ([place]) 12/01/2013  ? Erectile dysfunction 12/01/2013  ? HTN (hypertension) 12/01/2013  ? Hyperlipidemia 12/01/2013  ? Ischemic cardiomyopathy 12/01/2013  ? Sleep apnea   ? ? ?ROS: ? ? All systems reviewed and negative except as noted in the HPI. ? ? ?Past Surgical History:  ?Procedure Laterality Date  ? ACHILLES TENDON REPAIR    ? lft foot  ? CARDIAC  CATHETERIZATION N/A 05/05/2016  ? Procedure: Left Heart Cath and Coronary Angiography;  Surgeon: Leonie Man, MD;  Location: [facility];  Service: Cardiovascular;  Laterality: N/A;  ? CARDIAC DEFIBRILLATOR PLACEMENT    ? CARDIOVERSION N/A 09/13/2020  ? Procedure: CARDIOVERSION;  Surgeon: Werner Lean, MD;  Location: [facility];  Service: Cardiovascular;  Laterality: N/A;  ? CARDIOVERSION N/A 09/19/2020  ? Procedure: CARDIOVERSION;  Surgeon: [name], MD;  Location: [facility];  Service: Cardiovascular;  Laterality: N/A;  ? EP IMPLANTABLE DEVICE N/A 12/19/2016  ? Procedure: ICD Generator Changeout;  Surgeon: [name], MD;  Location: [facility];  Service: Cardiovascular;  Laterality: N/A;  ? RIGHT/LEFT HEART CATH AND CORONARY ANGIOGRAPHY N/A 09/24/2020  ? Procedure: RIGHT/LEFT HEART CATH AND CORONARY ANGIOGRAPHY;  Surgeon: Lorretta Harp, MD;  Location: [facility];  Service: Cardiovascular;  Laterality: N/A;  ? WRIST TENODESIS    ? ? ? ?Family History  ?Problem Relation Age of Onset  ? Heart disease Mother   ? Brain cancer Father   ? Hypertension Daughter   ? ? ? ?Social History  ? ?Socioeconomic History  ? Marital status: Single  ?  Spouse name: Not on file  ? Number of children: Not on file  ? Years of education: Not on file  ? Highest education level: Not on file  ?Occupational History  ? Not on file  ?Tobacco Use  ? Smoking status: Former  ?  Packs/day: 0.00  ?  Years: 30.00  ?  Pack years: 0.00  ?  Types: Cigarettes  ?  Quit date: 05/31/2013  ?  Years since quitting: 8.8  ? Smokeless tobacco: Never  ?Vaping Use  ? Vaping Use: Never used  ?Substance and Sexual Activity  ? Alcohol use: Not Currently  ?  Comment: ocassionally  ? Drug use: Never  ? Sexual activity: Not Currently  ?Other Topics Concern  ? Not on file  ?Social History Narrative  ? Pt lies in split level home with his son, daughter and grand-son  ? Has 3 children  ? Some college education  ? Retired  [employer].   ? Right handed  ? ?Social Determinants of Health  ? ?Financial Resource Strain: Not on file  ?Food Insecurity: Not on file  ?Transportation Needs: Not on file  ?Physical Activity: Not on file  ?Stress: Not on file  ?Social Connections: Not on file  ?Intimate Partner Violence: Not on file  ? ? ? ?BP 114/80   Pulse 71   Ht '6\' 6"'$  (1.981 m)   Wt 219 lb 6.4 oz (99.5 kg)   SpO2 98%   BMI 25.35 kg/m?  ? ?Physical Exam: ? ?Well appearing NAD ?HEENT: Unremarkable ?Neck:  No JVD, no thyromegally ?Lymphatics:  No adenopathy ?Back:  No CVA tenderness ?Lungs:  Clear with minimal rales in the bases.  No increased work of breathing. ?HEART:  Regular rate rhythm, no murmurs, no rubs, no clicks ?Abd:  soft, positive bowel sounds, no organomegally, no rebound, no guarding ?Ext:  2 plus pulses, no edema, no cyanosis, no clubbing ?Skin:  No rashes no nodules ?Neuro:  CN II through XII intact, motor grossly intact ? ?DEVICE  ?Normal device function.  See PaceArt for details.  ? ?Assess/Plan:  ?1.  Recurrent VT -the patient has multiple VT morphologies and at least 3 different circuits based on the rate of 160 in 2017 as well as most recent VT morphologies with 188 bpm and almost 300 bpm.  The patient denies loss of consciousness prior to his ICD shocks.  His dose of amiodarone was increased from 200 mg daily to 400 mg twice daily approximately 10 days ago.  The patient has a very difficult situation.  He has a very large scar from the apex to near the base inferiorly and laterally.  He has multiple VT morphologies, and I strongly suspect that these VT's with their increased rate would be very difficult to map.  The treatment options including VT ablation which would likely be largely substrate mediated versus up titration of amiodarone therapy were reviewed in detail.  I think the likelihood of cure from VT ablation is quite low.  There is certainly be a possibility of improvement in his VT with  ablation.  After discussing all the options with the patient and his daughter, I have recommended that he continue taking high-dose amiodarone and I will discuss the pros and cons of VT ablation with Dr. Caryl Comes.  Addit

## 2022-03-24 ENCOUNTER — Telehealth: Payer: Self-pay

## 2022-03-24 ENCOUNTER — Encounter (HOSPITAL_COMMUNITY): Payer: Self-pay | Admitting: Internal Medicine

## 2022-03-24 DIAGNOSIS — I255 Ischemic cardiomyopathy: Secondary | ICD-10-CM

## 2022-03-26 NOTE — Telephone Encounter (Signed)
Pt aware he needs an echo. ? ?[employer] message with phone number to call to schedule echo. ? ?Await further needs. ?

## 2022-04-05 ENCOUNTER — Other Ambulatory Visit: Payer: Self-pay | Admitting: Cardiovascular Disease

## 2022-04-07 ENCOUNTER — Ambulatory Visit (HOSPITAL_COMMUNITY): Payer: Worker's Compensation | Attending: Internal Medicine

## 2022-04-07 DIAGNOSIS — I255 Ischemic cardiomyopathy: Secondary | ICD-10-CM

## 2022-04-08 LAB — ECHOCARDIOGRAM COMPLETE
Area-P 1/2: 3.92 cm2
S' Lateral: 4.5 cm

## 2022-04-11 ENCOUNTER — Telehealth: Payer: Self-pay

## 2022-04-11 ENCOUNTER — Telehealth: Payer: Self-pay | Admitting: Cardiology

## 2022-04-11 ENCOUNTER — Other Ambulatory Visit: Payer: Self-pay

## 2022-04-11 ENCOUNTER — Other Ambulatory Visit: Payer: Worker's Compensation | Admitting: *Deleted

## 2022-04-11 ENCOUNTER — Encounter (HOSPITAL_COMMUNITY): Payer: Self-pay | Admitting: Emergency Medicine

## 2022-04-11 ENCOUNTER — Telehealth: Payer: Self-pay | Admitting: Cardiovascular Disease

## 2022-04-11 ENCOUNTER — Inpatient Hospital Stay (HOSPITAL_COMMUNITY)
Admission: EM | Admit: 2022-04-11 | Discharge: 2022-04-25 | DRG: 330 | Disposition: A | Discharge status: Home / Self Care | Payer: Commercial Managed Care - HMO | Attending: Family Medicine | Admitting: Family Medicine

## 2022-04-11 ENCOUNTER — Other Ambulatory Visit: Payer: Self-pay | Admitting: Neurology

## 2022-04-11 ENCOUNTER — Emergency Department (HOSPITAL_COMMUNITY): Payer: Commercial Managed Care - HMO

## 2022-04-11 DIAGNOSIS — Z7901 Long term (current) use of anticoagulants: Secondary | ICD-10-CM

## 2022-04-11 DIAGNOSIS — I5042 Chronic combined systolic (congestive) and diastolic (congestive) heart failure: Secondary | ICD-10-CM

## 2022-04-11 DIAGNOSIS — I472 Ventricular tachycardia, unspecified: Secondary | ICD-10-CM

## 2022-04-11 DIAGNOSIS — K449 Diaphragmatic hernia without obstruction or gangrene: Secondary | ICD-10-CM | POA: Diagnosis present

## 2022-04-11 DIAGNOSIS — G4733 Obstructive sleep apnea (adult) (pediatric): Secondary | ICD-10-CM

## 2022-04-11 DIAGNOSIS — I255 Ischemic cardiomyopathy: Secondary | ICD-10-CM

## 2022-04-11 DIAGNOSIS — Z87891 Personal history of nicotine dependence: Secondary | ICD-10-CM

## 2022-04-11 DIAGNOSIS — Z95810 Presence of automatic (implantable) cardiac defibrillator: Secondary | ICD-10-CM

## 2022-04-11 DIAGNOSIS — D500 Iron deficiency anemia secondary to blood loss (chronic): Secondary | ICD-10-CM | POA: Diagnosis present

## 2022-04-11 DIAGNOSIS — Z801 Family history of malignant neoplasm of trachea, bronchus and lung: Secondary | ICD-10-CM

## 2022-04-11 DIAGNOSIS — Z955 Presence of coronary angioplasty implant and graft: Secondary | ICD-10-CM

## 2022-04-11 DIAGNOSIS — K922 Gastrointestinal hemorrhage, unspecified: Secondary | ICD-10-CM | POA: Diagnosis not present

## 2022-04-11 DIAGNOSIS — E278 Other specified disorders of adrenal gland: Secondary | ICD-10-CM | POA: Diagnosis present

## 2022-04-11 DIAGNOSIS — K9189 Other postprocedural complications and disorders of digestive system: Secondary | ICD-10-CM | POA: Diagnosis not present

## 2022-04-11 DIAGNOSIS — I10 Essential (primary) hypertension: Secondary | ICD-10-CM | POA: Diagnosis present

## 2022-04-11 DIAGNOSIS — K567 Ileus, unspecified: Secondary | ICD-10-CM

## 2022-04-11 DIAGNOSIS — E119 Type 2 diabetes mellitus without complications: Secondary | ICD-10-CM | POA: Diagnosis present

## 2022-04-11 DIAGNOSIS — I480 Paroxysmal atrial fibrillation: Secondary | ICD-10-CM | POA: Diagnosis present

## 2022-04-11 DIAGNOSIS — Z8249 Family history of ischemic heart disease and other diseases of the circulatory system: Secondary | ICD-10-CM

## 2022-04-11 DIAGNOSIS — J9811 Atelectasis: Secondary | ICD-10-CM | POA: Diagnosis present

## 2022-04-11 DIAGNOSIS — R569 Unspecified convulsions: Secondary | ICD-10-CM

## 2022-04-11 DIAGNOSIS — N529 Male erectile dysfunction, unspecified: Secondary | ICD-10-CM | POA: Diagnosis present

## 2022-04-11 DIAGNOSIS — E876 Hypokalemia: Secondary | ICD-10-CM

## 2022-04-11 DIAGNOSIS — D125 Benign neoplasm of sigmoid colon: Secondary | ICD-10-CM | POA: Diagnosis present

## 2022-04-11 DIAGNOSIS — Z79891 Long term (current) use of opiate analgesic: Secondary | ICD-10-CM

## 2022-04-11 DIAGNOSIS — K869 Disease of pancreas, unspecified: Secondary | ICD-10-CM | POA: Diagnosis present

## 2022-04-11 DIAGNOSIS — K648 Other hemorrhoids: Secondary | ICD-10-CM | POA: Diagnosis present

## 2022-04-11 DIAGNOSIS — M7989 Other specified soft tissue disorders: Secondary | ICD-10-CM

## 2022-04-11 DIAGNOSIS — D126 Benign neoplasm of colon, unspecified: Secondary | ICD-10-CM

## 2022-04-11 DIAGNOSIS — Z79899 Other long term (current) drug therapy: Secondary | ICD-10-CM

## 2022-04-11 DIAGNOSIS — D649 Anemia, unspecified: Principal | ICD-10-CM | POA: Diagnosis present

## 2022-04-11 DIAGNOSIS — R195 Other fecal abnormalities: Secondary | ICD-10-CM

## 2022-04-11 DIAGNOSIS — K6389 Other specified diseases of intestine: Secondary | ICD-10-CM

## 2022-04-11 DIAGNOSIS — K219 Gastro-esophageal reflux disease without esophagitis: Secondary | ICD-10-CM | POA: Diagnosis present

## 2022-04-11 DIAGNOSIS — I110 Hypertensive heart disease with heart failure: Secondary | ICD-10-CM | POA: Diagnosis present

## 2022-04-11 DIAGNOSIS — I2582 Chronic total occlusion of coronary artery: Secondary | ICD-10-CM | POA: Diagnosis present

## 2022-04-11 DIAGNOSIS — D6832 Hemorrhagic disorder due to extrinsic circulating anticoagulants: Secondary | ICD-10-CM | POA: Diagnosis present

## 2022-04-11 DIAGNOSIS — C182 Malignant neoplasm of ascending colon: Secondary | ICD-10-CM | POA: Diagnosis not present

## 2022-04-11 DIAGNOSIS — G40909 Epilepsy, unspecified, not intractable, without status epilepticus: Secondary | ICD-10-CM | POA: Diagnosis present

## 2022-04-11 DIAGNOSIS — Z8042 Family history of malignant neoplasm of prostate: Secondary | ICD-10-CM

## 2022-04-11 DIAGNOSIS — C189 Malignant neoplasm of colon, unspecified: Secondary | ICD-10-CM

## 2022-04-11 DIAGNOSIS — E785 Hyperlipidemia, unspecified: Secondary | ICD-10-CM | POA: Diagnosis present

## 2022-04-11 DIAGNOSIS — I82612 Acute embolism and thrombosis of superficial veins of left upper extremity: Secondary | ICD-10-CM | POA: Diagnosis not present

## 2022-04-11 DIAGNOSIS — I4891 Unspecified atrial fibrillation: Secondary | ICD-10-CM | POA: Diagnosis present

## 2022-04-11 DIAGNOSIS — Z7902 Long term (current) use of antithrombotics/antiplatelets: Secondary | ICD-10-CM

## 2022-04-11 DIAGNOSIS — I2510 Atherosclerotic heart disease of native coronary artery without angina pectoris: Secondary | ICD-10-CM

## 2022-04-11 DIAGNOSIS — K921 Melena: Secondary | ICD-10-CM | POA: Diagnosis present

## 2022-04-11 DIAGNOSIS — C772 Secondary and unspecified malignant neoplasm of intra-abdominal lymph nodes: Secondary | ICD-10-CM | POA: Diagnosis present

## 2022-04-11 HISTORY — DX: Presence of cardiac pacemaker: Z95.0

## 2022-04-11 LAB — CBC WITH DIFFERENTIAL/PLATELET
Abs Immature Granulocytes: 0.02 10*3/uL (ref 0.00–0.07)
Basophils Absolute: 0 10*3/uL (ref 0.0–0.2)
Basophils Absolute: 0 10*3/uL (ref 0.0–0.1)
Basophils Relative: 0 %
Basos: 0 %
EOS (ABSOLUTE): 0.1 10*3/uL (ref 0.0–0.4)
Eos: 1 %
Eosinophils Absolute: 0.1 10*3/uL (ref 0.0–0.5)
Eosinophils Relative: 1 %
HCT: 16.7 % — ABNORMAL LOW (ref 39.0–52.0)
Hematocrit: 16 % — CL (ref 37.5–51.0)
Hemoglobin: 4.1 g/dL — CL (ref 13.0–17.7)
Hemoglobin: 4 g/dL — CL (ref 13.0–17.0)
Immature Grans (Abs): 0 10*3/uL (ref 0.0–0.1)
Immature Granulocytes: 0 %
Immature Granulocytes: 0 %
Lymphocytes Absolute: 1.5 10*3/uL (ref 0.7–3.1)
Lymphocytes Relative: 21 %
Lymphs Abs: 1.4 10*3/uL (ref 0.7–4.0)
Lymphs: 17 %
MCH: 18.3 pg — ABNORMAL LOW (ref 26.6–33.0)
MCH: 18.3 pg — ABNORMAL LOW (ref 26.0–34.0)
MCHC: 25.6 g/dL — ABNORMAL LOW (ref 31.5–35.7)
MCHC: 24 g/dL — ABNORMAL LOW (ref 30.0–36.0)
MCV: 71 fL — ABNORMAL LOW (ref 79–97)
MCV: 76.6 fL — ABNORMAL LOW (ref 80.0–100.0)
Monocytes Absolute: 0.8 10*3/uL (ref 0.1–0.9)
Monocytes Absolute: 0.7 10*3/uL (ref 0.1–1.0)
Monocytes Relative: 10 %
Monocytes: 8 %
Neutro Abs: 4.7 10*3/uL (ref 1.7–7.7)
Neutrophils Absolute: 6.6 10*3/uL (ref 1.4–7.0)
Neutrophils Relative %: 68 %
Neutrophils: 74 %
Platelets: 353 10*3/uL (ref 150–450)
Platelets: 351 10*3/uL (ref 150–400)
RBC: 2.24 x10E6/uL — CL (ref 4.14–5.80)
RBC: 2.18 MIL/uL — ABNORMAL LOW (ref 4.22–5.81)
RDW: 18 % — ABNORMAL HIGH (ref 11.6–15.4)
RDW: 19.1 % — ABNORMAL HIGH (ref 11.5–15.5)
WBC: 9 10*3/uL (ref 3.4–10.8)
WBC: 6.9 10*3/uL (ref 4.0–10.5)
nRBC: 0.6 % — ABNORMAL HIGH (ref 0.0–0.2)

## 2022-04-11 LAB — BASIC METABOLIC PANEL
BUN/Creatinine Ratio: 14 (ref 10–24)
BUN: 13 mg/dL (ref 8–27)
CO2: 21 mmol/L (ref 20–29)
Calcium: 8.1 mg/dL — ABNORMAL LOW (ref 8.6–10.2)
Chloride: 105 mmol/L (ref 96–106)
Creatinine, Ser: 0.91 mg/dL (ref 0.76–1.27)
Glucose: 108 mg/dL — ABNORMAL HIGH (ref 70–99)
Potassium: 5.1 mmol/L (ref 3.5–5.2)
Sodium: 137 mmol/L (ref 134–144)
eGFR: 95 mL/min/{1.73_m2} (ref >59)

## 2022-04-11 LAB — COMPREHENSIVE METABOLIC PANEL
ALT: 12 U/L (ref 0–44)
AST: 13 U/L — ABNORMAL LOW (ref 15–41)
Albumin: 3.1 g/dL — ABNORMAL LOW (ref 3.5–5.0)
Alkaline Phosphatase: 60 U/L (ref 38–126)
Anion gap: 4 — ABNORMAL LOW (ref 5–15)
BUN: 14 mg/dL (ref 8–23)
CO2: 23 mmol/L (ref 22–32)
Calcium: 8.1 mg/dL — ABNORMAL LOW (ref 8.9–10.3)
Chloride: 110 mmol/L (ref 98–111)
Creatinine, Ser: 1.11 mg/dL (ref 0.61–1.24)
GFR, Estimated: >60 mL/min (ref >60)
Glucose, Bld: 115 mg/dL — ABNORMAL HIGH (ref 70–99)
Potassium: 4.6 mmol/L (ref 3.5–5.1)
Sodium: 137 mmol/L (ref 135–145)
Total Bilirubin: 0.1 mg/dL — ABNORMAL LOW (ref 0.3–1.2)
Total Protein: 6.2 g/dL — ABNORMAL LOW (ref 6.5–8.1)

## 2022-04-11 LAB — BRAIN NATRIURETIC PEPTIDE: B Natriuretic Peptide: 616.2 pg/mL — ABNORMAL HIGH (ref 0.0–100.0)

## 2022-04-11 MED ORDER — APIXABAN 5 MG PO TABS: ORAL_TABLET | ORAL | 1 refills | Status: DC

## 2022-04-11 NOTE — ED Triage Notes (Signed)
Pt reported to ED to be evaluated further after having blood drawn at doctor's office and being notified that his hemoglobin is 4. Pt endorses ongoing fatigue and shortness of breath. Pallor noted to mucous membranes. Denies any pain or rectal bleeding.  ?

## 2022-04-11 NOTE — Telephone Encounter (Signed)
Left message for Pt.   ? ?Advised could schedule cath for next Tuesday or Thursday. ? ?Would be ideal to get labs today. ? ?Requested call back or response to MyChart message. ?

## 2022-04-11 NOTE — Telephone Encounter (Signed)
Prescription refill request for Eliquis received. ?Indication:Afib ?Last office visit:4/23 ?Scr:0.8 ?Age: 61 ?Weight:99.5 kg ? ?Prescription refilled ? ?

## 2022-04-11 NOTE — Telephone Encounter (Signed)
-----   Message from Evans Lance, MD sent at 04/08/2022  8:17 PM EDT ----- ?His EF is down a bit and he has multiple VT morphologies including ventricular flutter. I would like for him to undergo left heart cath if possible next week due to recurrent VT and worsening dyspnea. ?

## 2022-04-11 NOTE — Telephone Encounter (Signed)
? ?*  STAT* If patient is at the pharmacy, call can be transferred to refill team. ? ? ?1. Which medications need to be refilled? (please list name of each medication and dose if known)  ? ELIQUIS 5 MG TABS tablet  ? ? ?2. Which pharmacy/location (including street and city if local pharmacy) is medication to be sent to?[place] [place] ? ?3. Do they need a 30 day or 90 day supply? 90 days ? ?Pt needs refill today ? ?

## 2022-04-11 NOTE — ED Provider Triage Note (Signed)
Emergency Medicine Provider Triage Evaluation Note ? ?Gregory [name] , a 61 y.o. male  was evaluated in triage.  Pt complains of abnormal lab.  He states over the past 2 weeks ago he has been increased short of breath, generalized weakness, fatigue.  He felt like he had episode of presyncope over the weekend.  He went to his doctor's office for these complaints and had labs drawn.  He was found to have a hemoglobin of 4.0.  He denies ever having anemia to this extent.  He denies any melanotic stools or blood in stool, however he says difficult to assess because his stools are always a little bit dark because he takes iron supplements.  He is on Eliquis. ? ?Review of Systems  ?Positive:  ?Negative:  ? ?Physical Exam  ?BP 112/61   Pulse 70   Temp 98.4 ?F (36.9 ?C) (Oral)   Resp 18   SpO2 100%  ?Gen:   Awake, no distress   ?Resp:  Normal effort  ?MSK:   Moves extremities without difficulty  ?Other:  Patient Is very pale.  Pallor to mucous membranes ? ?Medical Decision Making  ?Medically screening exam initiated at 8:53 PM.  Appropriate orders placed.  Arshad Washington Persing was informed that the remainder of the evaluation will be completed by another provider, this initial triage assessment does not replace that evaluation, and the importance of remaining in the ED until their evaluation is complete. ? ? ?  ?Adolphus Birchwood, PA-C ?04/11/22 2054 ? ?

## 2022-04-11 NOTE — Telephone Encounter (Signed)
Pt arrived to office for lab work. ? ?Labs ordered and completed. ? ?Pt will be scheduled for heart cath Apr 15, 2022 at 10:30 am with Dr. Tamala Julian. ? ?Work up complete. ?

## 2022-04-11 NOTE — Telephone Encounter (Signed)
Received a phone call from [facility] to report a critical lab value. Patient had CBC drawn earlier today. Results showed RBC 2.24, Hemoglobin 4.1, hematocrit 16.0. Read lab values back to [employer] representative to confirm.  ? ?I contacted patient at the phone number listed in the chart [phone]. Confirmed identity with name and DOB.  Informed him of lab results. Patient denies any chest pain, dizziness, active bleeding. Reports some fatigue and SOB on exertion. Due to his hemoglobin of 4.1 and hematocrit of 16.0, I told patient to go to the ER for further treatment. Patient voiced understanding and agreed to present to the ER.   ? ?Margie Billet, PA-C ?04/11/2022 7:08 PM  ?

## 2022-04-12 ENCOUNTER — Encounter (HOSPITAL_COMMUNITY): Payer: Self-pay | Admitting: Internal Medicine

## 2022-04-12 DIAGNOSIS — K648 Other hemorrhoids: Secondary | ICD-10-CM | POA: Diagnosis not present

## 2022-04-12 DIAGNOSIS — R195 Other fecal abnormalities: Secondary | ICD-10-CM

## 2022-04-12 DIAGNOSIS — K635 Polyp of colon: Secondary | ICD-10-CM | POA: Diagnosis not present

## 2022-04-12 DIAGNOSIS — Z7901 Long term (current) use of anticoagulants: Secondary | ICD-10-CM | POA: Diagnosis not present

## 2022-04-12 DIAGNOSIS — I480 Paroxysmal atrial fibrillation: Secondary | ICD-10-CM | POA: Diagnosis present

## 2022-04-12 DIAGNOSIS — D649 Anemia, unspecified: Secondary | ICD-10-CM | POA: Diagnosis not present

## 2022-04-12 DIAGNOSIS — M7989 Other specified soft tissue disorders: Secondary | ICD-10-CM | POA: Diagnosis present

## 2022-04-12 DIAGNOSIS — C772 Secondary and unspecified malignant neoplasm of intra-abdominal lymph nodes: Secondary | ICD-10-CM | POA: Diagnosis present

## 2022-04-12 DIAGNOSIS — C184 Malignant neoplasm of transverse colon: Secondary | ICD-10-CM | POA: Diagnosis not present

## 2022-04-12 DIAGNOSIS — E876 Hypokalemia: Secondary | ICD-10-CM | POA: Diagnosis not present

## 2022-04-12 DIAGNOSIS — D125 Benign neoplasm of sigmoid colon: Secondary | ICD-10-CM | POA: Diagnosis present

## 2022-04-12 DIAGNOSIS — D500 Iron deficiency anemia secondary to blood loss (chronic): Secondary | ICD-10-CM | POA: Diagnosis present

## 2022-04-12 DIAGNOSIS — D490 Neoplasm of unspecified behavior of digestive system: Secondary | ICD-10-CM | POA: Diagnosis not present

## 2022-04-12 DIAGNOSIS — I82612 Acute embolism and thrombosis of superficial veins of left upper extremity: Secondary | ICD-10-CM | POA: Diagnosis not present

## 2022-04-12 DIAGNOSIS — I5042 Chronic combined systolic (congestive) and diastolic (congestive) heart failure: Secondary | ICD-10-CM | POA: Diagnosis present

## 2022-04-12 DIAGNOSIS — D509 Iron deficiency anemia, unspecified: Secondary | ICD-10-CM | POA: Diagnosis not present

## 2022-04-12 DIAGNOSIS — C180 Malignant neoplasm of cecum: Secondary | ICD-10-CM | POA: Diagnosis not present

## 2022-04-12 DIAGNOSIS — J9811 Atelectasis: Secondary | ICD-10-CM | POA: Diagnosis present

## 2022-04-12 DIAGNOSIS — K922 Gastrointestinal hemorrhage, unspecified: Secondary | ICD-10-CM | POA: Diagnosis present

## 2022-04-12 DIAGNOSIS — C189 Malignant neoplasm of colon, unspecified: Secondary | ICD-10-CM | POA: Diagnosis not present

## 2022-04-12 DIAGNOSIS — I472 Ventricular tachycardia, unspecified: Secondary | ICD-10-CM | POA: Diagnosis not present

## 2022-04-12 DIAGNOSIS — K921 Melena: Secondary | ICD-10-CM | POA: Diagnosis present

## 2022-04-12 DIAGNOSIS — I255 Ischemic cardiomyopathy: Secondary | ICD-10-CM | POA: Diagnosis present

## 2022-04-12 DIAGNOSIS — E278 Other specified disorders of adrenal gland: Secondary | ICD-10-CM | POA: Diagnosis present

## 2022-04-12 DIAGNOSIS — D6832 Hemorrhagic disorder due to extrinsic circulating anticoagulants: Secondary | ICD-10-CM | POA: Diagnosis present

## 2022-04-12 DIAGNOSIS — G40909 Epilepsy, unspecified, not intractable, without status epilepticus: Secondary | ICD-10-CM | POA: Diagnosis present

## 2022-04-12 DIAGNOSIS — I2510 Atherosclerotic heart disease of native coronary artery without angina pectoris: Secondary | ICD-10-CM | POA: Diagnosis present

## 2022-04-12 DIAGNOSIS — E119 Type 2 diabetes mellitus without complications: Secondary | ICD-10-CM | POA: Diagnosis present

## 2022-04-12 DIAGNOSIS — C182 Malignant neoplasm of ascending colon: Secondary | ICD-10-CM | POA: Diagnosis present

## 2022-04-12 DIAGNOSIS — K869 Disease of pancreas, unspecified: Secondary | ICD-10-CM | POA: Diagnosis present

## 2022-04-12 DIAGNOSIS — E785 Hyperlipidemia, unspecified: Secondary | ICD-10-CM | POA: Diagnosis present

## 2022-04-12 DIAGNOSIS — I509 Heart failure, unspecified: Secondary | ICD-10-CM | POA: Diagnosis not present

## 2022-04-12 DIAGNOSIS — K449 Diaphragmatic hernia without obstruction or gangrene: Secondary | ICD-10-CM | POA: Diagnosis not present

## 2022-04-12 DIAGNOSIS — I2582 Chronic total occlusion of coronary artery: Secondary | ICD-10-CM | POA: Diagnosis present

## 2022-04-12 DIAGNOSIS — K567 Ileus, unspecified: Secondary | ICD-10-CM | POA: Diagnosis not present

## 2022-04-12 DIAGNOSIS — K9189 Other postprocedural complications and disorders of digestive system: Secondary | ICD-10-CM | POA: Diagnosis not present

## 2022-04-12 DIAGNOSIS — K6389 Other specified diseases of intestine: Secondary | ICD-10-CM | POA: Diagnosis not present

## 2022-04-12 DIAGNOSIS — I110 Hypertensive heart disease with heart failure: Secondary | ICD-10-CM | POA: Diagnosis present

## 2022-04-12 LAB — PREPARE RBC (CROSSMATCH)

## 2022-04-12 LAB — HIV ANTIBODY (ROUTINE TESTING W REFLEX): HIV Screen 4th Generation wRfx: NONREACTIVE

## 2022-04-12 LAB — GLUCOSE, CAPILLARY
Glucose-Capillary: 109 mg/dL — ABNORMAL HIGH (ref 70–99)
Glucose-Capillary: 103 mg/dL — ABNORMAL HIGH (ref 70–99)
Glucose-Capillary: 98 mg/dL (ref 70–99)
Glucose-Capillary: 176 mg/dL — ABNORMAL HIGH (ref 70–99)
Glucose-Capillary: 134 mg/dL — ABNORMAL HIGH (ref 70–99)

## 2022-04-12 LAB — HEMOGLOBIN AND HEMATOCRIT, BLOOD
HCT: 22 % — ABNORMAL LOW (ref 39.0–52.0)
HCT: 25.5 % — ABNORMAL LOW (ref 39.0–52.0)
Hemoglobin: 6.6 g/dL — CL (ref 13.0–17.0)
Hemoglobin: 7.4 g/dL — ABNORMAL LOW (ref 13.0–17.0)

## 2022-04-12 LAB — POC OCCULT BLOOD, ED: Fecal Occult Bld: POSITIVE — AB

## 2022-04-12 MED ORDER — METOPROLOL SUCCINATE ER 25 MG PO TB24
25.0000 mg | ORAL_TABLET | Freq: Two times a day (BID) | ORAL | Status: DC
  Administered 2022-04-12 – 2022-04-25 (×26): 25 mg via ORAL
  Filled 2022-04-12 (×27): qty 1

## 2022-04-12 MED ORDER — METOPROLOL SUCCINATE ER 50 MG PO TB24: 50.0000 mg | ORAL_TABLET | Freq: Every day | ORAL | Status: DC

## 2022-04-12 MED ORDER — DIPHENHYDRAMINE HCL 25 MG PO CAPS
25.0000 mg | ORAL_CAPSULE | Freq: Once | ORAL | Status: AC
  Administered 2022-04-12: 25 mg via ORAL
  Filled 2022-04-12: qty 1

## 2022-04-12 MED ORDER — SACUBITRIL-VALSARTAN 49-51 MG PO TABS
1.0000 | ORAL_TABLET | Freq: Two times a day (BID) | ORAL | Status: DC
  Administered 2022-04-12 – 2022-04-25 (×25): 1 via ORAL
  Filled 2022-04-12 (×27): qty 1

## 2022-04-12 MED ORDER — MELATONIN 5 MG PO TABS
10.0000 mg | ORAL_TABLET | Freq: Every evening | ORAL | Status: DC | PRN
  Administered 2022-04-12 – 2022-04-24 (×8): 10 mg via ORAL
  Filled 2022-04-12 (×8): qty 2

## 2022-04-12 MED ORDER — PANTOPRAZOLE SODIUM 40 MG IV SOLR
40.0000 mg | Freq: Two times a day (BID) | INTRAVENOUS | Status: DC
  Administered 2022-04-12 – 2022-04-15 (×7): 40 mg via INTRAVENOUS
  Filled 2022-04-12 (×7): qty 10

## 2022-04-12 MED ORDER — EZETIMIBE 10 MG PO TABS
10.0000 mg | ORAL_TABLET | Freq: Every day | ORAL | Status: DC
  Administered 2022-04-12 – 2022-04-25 (×13): 10 mg via ORAL
  Filled 2022-04-12 (×13): qty 1

## 2022-04-12 MED ORDER — ATORVASTATIN CALCIUM 80 MG PO TABS
80.0000 mg | ORAL_TABLET | Freq: Every evening | ORAL | Status: DC
  Administered 2022-04-12 – 2022-04-24 (×13): 80 mg via ORAL
  Filled 2022-04-12 (×13): qty 1

## 2022-04-12 MED ORDER — LEVETIRACETAM 500 MG PO TABS
500.0000 mg | ORAL_TABLET | Freq: Two times a day (BID) | ORAL | Status: DC
  Administered 2022-04-12 – 2022-04-25 (×27): 500 mg via ORAL
  Filled 2022-04-12 (×27): qty 1

## 2022-04-12 MED ORDER — INSULIN ASPART 100 UNIT/ML IJ SOLN
0.0000 [IU] | INTRAMUSCULAR | Status: DC
  Administered 2022-04-12: 1 [IU] via SUBCUTANEOUS

## 2022-04-12 MED ORDER — SODIUM CHLORIDE 0.9% IV SOLUTION: Freq: Once | INTRAVENOUS | Status: DC

## 2022-04-12 MED ORDER — SODIUM CHLORIDE 0.9 % IV SOLN
10.0000 mL/h | Freq: Once | INTRAVENOUS | Status: AC
Start: 2022-04-12 — End: 2022-04-12
  Administered 2022-04-12: 10 mL/h via INTRAVENOUS

## 2022-04-12 MED ORDER — ACETAMINOPHEN 325 MG PO TABS
650.0000 mg | ORAL_TABLET | Freq: Four times a day (QID) | ORAL | Status: DC | PRN
  Administered 2022-04-17: 650 mg via ORAL
  Filled 2022-04-12: qty 2

## 2022-04-12 MED ORDER — AMIODARONE HCL 200 MG PO TABS
200.0000 mg | ORAL_TABLET | Freq: Every day | ORAL | Status: DC
  Administered 2022-04-12 – 2022-04-15 (×4): 200 mg via ORAL
  Filled 2022-04-12 (×4): qty 1

## 2022-04-12 NOTE — Progress Notes (Signed)
?PROGRESS NOTE ? ? ? ?Gregory [name]  ZOX:[id number] DOB: 03/05/1961 DOA: 04/11/2022 ?PCP: Patient, [name] ? ? ?Brief Narrative:  ?Gregory [name] is a 61 y.o. male with history of CAD status post stenting, chronic systolic HF, recurrent VTE, atrial fibrillation, diabetes mellitus type 2 had blood work done by his cardiologist and was found to hemoglobin around 4 was advised to come to the ER.  Patient states over the last few weeks he has been feeling increasingly weak and fatigued and short of breath on exertion.  Denies chest pain nausea vomiting diarrhea. Patient states he takes iron pills and has always had dark bowel movements.  Never had a colonoscopy or EGD. ? ?Assessment & Plan: ?  ?Principal Problem: ?  Acute GI bleeding ?Active Problems: ?  Ischemic cardiomyopathy ?  CAD S/P percutaneous coronary angioplasty ?  Essential hypertension ?  OSA on CPAP ?  Seizure disorder ([place]) ?  Symptomatic anemia ? ?Profound acute symptomatic anemia with concurrent GI bleed  ?-Hemoglobin 4.0 at intake, blood transfusion ongoing  ?-Pending repeat hemoglobin may require additional transfusion, follow clinically  ?-Continue to hold Eliquis and Plavix ?-[facility] to follow, will need washout for anticoagulation and antiplatelets ?-Advance diet and procedure per GI recommendations ?**Repeat H&H this afternoon 6.6, will transfuse 1 additional unit ? ?History of chronic systolic CHF appears compensated. ?-Holding Lasix for now.  ?-Continue Entresto.  Status post ICD. ? ?History of recurrent VT on amiodarone.  ?-Cardiology is planning further interventions. ? ?History of A-fib in sinus rhythm on metoprolol and amiodarone ?- Eliquis on hold secondary to above. ? ?Diet controlled diabetes mellitus type 2  ?Continue sliding scale insulin/ ? ?Seizures on Keppra. ?Sleep apnea on CPAP. ?CAD status post tenting denies any chest pain.  Holding Plavix due to GI bleed. ?  ?DVT prophylaxis: SCDs. ?Code  Status: Full code. ?Family Communication: Discussed with patient. ? ?Status is: Inpatient ? ?Dispo: The patient is from: Home ?             Anticipated d/c is to: Home ?             Anticipated d/c date is: 48 to 72 hours ?             Patient currently not medically stable for discharge ? ?Consultants:  ?GI, [place] ? ?Procedures:  ?Pending ? ?Antimicrobials:  ?None ? ?Subjective: ?No acute issues or events overnight, only complaint today is of hunger but denies chest pain shortness of breath nausea vomiting diarrhea constipation headache fevers or chills ? ?Objective: ?Vitals:  ? 04/12/22 8119 04/12/22 0506 04/12/22 0520 04/12/22 0750  ?BP: 110/69 110/69 114/65 122/75  ?Pulse:    67  ?Resp: 20   15  ?Temp: 98.6 ?F (37 ?C) 98.6 ?F (37 ?C) 98.6 ?F (37 ?C) 98.4 ?F (36.9 ?C)  ?TempSrc: Oral Oral Oral Oral  ?SpO2:  100%  98%  ?Weight:      ?Height:      ? ? ?Intake/Output Summary (Last 24 hours) at 04/12/2022 0759 ?Last data filed at 04/12/2022 0415 ?Gross per 24 hour  ?Intake 346 ml  ?Output --  ?Net 346 ml  ? ?Filed Weights  ? 04/12/22 0350  ?Weight: 95.8 kg  ? ? ?Examination: ? ?General:  Pleasantly resting in bed, No acute distress. ?Lungs:  Clear to auscultate bilaterally without rhonchi, wheeze, or rales. ?Heart:  Regular rate and rhythm.  Without murmurs, rubs, or gallops. ?Abdomen:  Soft, nontender, nondistended.  Without  guarding or rebound. ?Extremities: Without cyanosis, clubbing, edema, or obvious deformity. ?Vascular:  Dorsalis pedis and posterior tibial pulses palpable bilaterally. ?Skin:  Warm and dry, no erythema, no ulcerations. ? ?Data Reviewed: I have personally reviewed following labs and imaging studies ? ?CBC: ?Recent Labs  ?Lab 04/11/22 ?1219 04/11/22 ?2107  ?WBC 9.0 6.9  ?NEUTROABS 6.6 4.7  ?HGB 4.1* 4.0*  ?HCT 16.0* 16.7*  ?MCV 71* 76.6*  ?PLT 353 351  ? ?Basic Metabolic Panel: ?Recent Labs  ?Lab 04/11/22 ?1219 04/11/22 ?2107  ?NA 137 137  ?K 5.1 4.6  ?CL 105 110  ?CO2 21 23  ?GLUCOSE 108* 115*   ?BUN 13 14  ?CREATININE 0.91 1.11  ?CALCIUM 8.1* 8.1*  ? ?GFR: ?Estimated Creatinine Clearance: 88.1 mL/min (by C-G formula based on SCr of 1.11 mg/dL). ?Liver Function Tests: ?Recent Labs  ?Lab 04/11/22 ?2107  ?AST 13*  ?ALT 12  ?ALKPHOS 60  ?BILITOT 0.1*  ?PROT 6.2*  ?ALBUMIN 3.1*  ? ?No results for input(s): LIPASE, AMYLASE in the last 168 hours. ?No results for input(s): AMMONIA in the last 168 hours. ?Coagulation Profile: ?No results for input(s): INR, PROTIME in the last 168 hours. ?Cardiac Enzymes: ?No results for input(s): CKTOTAL, CKMB, CKMBINDEX, TROPONINI in the last 168 hours. ?BNP (last 3 results) ?No results for input(s): PROBNP in the last 8760 hours. ?HbA1C: ?No results for input(s): HGBA1C in the last 72 hours. ?CBG: ?Recent Labs  ?Lab 04/12/22 ?0406 04/12/22 ?2458  ?GLUCAP 109* 103*  ? ?Lipid Profile: ?No results for input(s): CHOL, HDL, LDLCALC, TRIG, CHOLHDL, LDLDIRECT in the last 72 hours. ?Thyroid Function Tests: ?No results for input(s): TSH, T4TOTAL, FREET4, T3FREE, THYROIDAB in the last 72 hours. ?Anemia Panel: ?No results for input(s): VITAMINB12, FOLATE, FERRITIN, TIBC, IRON, RETICCTPCT in the last 72 hours. ?Sepsis Labs: ?No results for input(s): PROCALCITON, LATICACIDVEN in the last 168 hours. ? ?No results found for this or any previous visit (from the past 240 hour(s)).  ? ? ? ? ? ?Radiology Studies: ?DG Chest 2 View ? ?Result Date: 04/11/2022 ?CLINICAL DATA:  Shortness of breath. EXAM: CHEST - 2 VIEW COMPARISON:  09/14/2020. FINDINGS: The heart size and mediastinal contours are stable. No consolidation, effusion, or pneumothorax. A calcified granuloma is present at the left lung base. Minimal atelectasis or scarring is noted at the left lung base. A multi lead pacemaker device is present over the left chest. Degenerative changes are present in the thoracic spine. No acute osseous abnormality. IMPRESSION: Mild atelectasis or scarring at the left lung base. Electronically Signed   By:  [name] M.D.   On: 04/11/2022 21:57   ? ? ? ? ? ?Scheduled Meds: ? amiodarone  200 mg Oral Daily  ? atorvastatin  80 mg Oral QPM  ? ezetimibe  10 mg Oral Daily  ? insulin aspart  0-6 Units Subcutaneous Q4H  ? levETIRAcetam  500 mg Oral BID  ? metoprolol succinate  25 mg Oral BID  ? pantoprazole (PROTONIX) IV  40 mg Intravenous Q12H  ? sacubitril-valsartan  1 tablet Oral BID  ? ?Continuous Infusions: ? ? LOS: 0 days  ? ?Time spent: 63mn ? ?WLittle Ishikawa DO ?[facility] ? ?If 7PM-7AM, please contact night-coverage ?[email or web address] ? ?04/12/2022, 7:59 AM   ? ? ? ?

## 2022-04-12 NOTE — ED Provider Notes (Signed)
?[place] ?Provider Note ? ? ?CSN: [id number] ?Arrival date & time: 04/11/22  2010 ? ?  ? ?History ? ?Chief Complaint  ?Patient presents with  ? Abnormal Lab  ? ? ?Gregory [name] is a 61 y.o. male. ? ?Patient is a 61 year old male with past medical history of paroxysmal atrial fibrillation on Eliquis, coronary artery disease with stent, ischemic cardiomyopathy with AICD, CHF, hypertension.  Patient presenting today with complaints of low hemoglobin.  Patient reports a several week history of dyspnea on exertion and fatigue.  He went to the primary doctor's office this week, then was notified today that his hemoglobin was low and he should come to the ER.  Patient found to have a hemoglobin of 4.0.  He denies to me he is having any black or tarry stools.  He denies any fevers or chills.  He denies abdominal pain. ? ?The history is provided by the patient.  ? ?  ? ?Home Medications ?Prior to Admission medications   ?Medication Sig Start Date End Date Taking? Authorizing Provider  ?amiodarone (PACERONE) 200 MG tablet Take 1 tablet (200 mg total) by mouth daily. 03/10/22   [name], MD  ?apixaban (ELIQUIS) 5 MG TABS tablet TAKE 1 TABLET(5 MG) BY MOUTH TWICE DAILY 04/11/22   Croitoru, Dani Gobble, MD  ?atorvastatin (LIPITOR) 80 MG tablet Take 1 tablet (80 mg total) by mouth every evening. 04/16/21   Croitoru, [name], MD  ?clopidogrel (PLAVIX) 75 MG tablet TAKE 1 TABLET(75 MG) BY MOUTH DAILY 10/02/21   Croitoru, [name], MD  ?ezetimibe (ZETIA) 10 MG tablet Take 1 tablet (10 mg total) by mouth daily. 03/13/22   Croitoru, [name], MD  ?FARXIGA 10 MG TABS tablet TAKE 1 TABLET(10 MG) BY MOUTH DAILY BEFORE BREAKFAST 04/07/22   Croitoru, [name], MD  ?furosemide (LASIX) 20 MG tablet Take 2 tablets (40 mg total) by mouth 3 (three) times a week. 03/12/22   [name], MD  ?levETIRAcetam (KEPPRA) 500 MG tablet TAKE 1 TABLET(500 MG) BY MOUTH TWICE DAILY 04/11/22   [name], MD   ?melatonin 5 MG TABS Take 1 tablet (5 mg total) by mouth at bedtime. 10/10/20   Love, Ivan Anchors, PA-C  ?metoprolol succinate (TOPROL-XL) 25 MG 24 hr tablet Take one tablet, 25 mg, in the morning and 50 mg, two tablets, in the evening. ?Patient taking differently: 25 mg. Per patient taking 25 mg in the morning and 25 mg at night 06/27/21   Croitoru, [name], MD  ?oxyCODONE (OXY IR/ROXICODONE) 5 MG immediate release tablet Take 1 tablet (5 mg total) by mouth every 12 (twelve) hours as needed for severe pain. 10/10/20   Love, Ivan Anchors, PA-C  ?pantoprazole (PROTONIX) 40 MG tablet TAKE 1 TABLET(40 MG) BY MOUTH DAILY 03/13/22   Croitoru, [name], MD  ?potassium chloride (KLOR-CON M) 10 MEQ tablet Take 1 tablet (10 mEq total) by mouth daily. 12/02/21   Croitoru, [name], MD  ?sacubitril-valsartan (ENTRESTO) 49-51 MG Take 1 tablet by mouth 2 (two) times daily. 10/07/21   Croitoru, [name], MD  ?sildenafil (VIAGRA) 50 MG tablet Take 1 tablet (50 mg total) by mouth as needed for erectile dysfunction. 01/25/21   Shirley Friar, PA-C  ?traZODone (DESYREL) 100 MG tablet TAKE 1 TABLET(100 MG) BY MOUTH AT BEDTIME ?Patient not taking: Reported on 03/21/2022 10/02/21   [name], MD  ?   ? ?Allergies    ?Patient has no known allergies.   ? ?Review of Systems   ?  Review of Systems  ?All other systems reviewed and are negative. ? ?Physical Exam ?Updated Vital Signs ?BP (!) 114/59 (BP Location: Left Arm)   Pulse 66   Temp 98 ?F (36.7 ?C) (Oral)   Resp 16   SpO2 100%  ?Physical Exam ?Vitals and nursing note reviewed.  ?Constitutional:   ?   General: He is not in acute distress. ?   Appearance: He is well-developed. He is not diaphoretic.  ?HENT:  ?   Head: Normocephalic and atraumatic.  ?Cardiovascular:  ?   Rate and Rhythm: Normal rate and regular rhythm.  ?   Heart sounds: No murmur heard. ?  No friction rub.  ?Pulmonary:  ?   Effort: Pulmonary effort is normal. No respiratory distress.  ?   Breath sounds: Normal breath sounds.  No wheezing or rales.  ?Abdominal:  ?   General: Bowel sounds are normal. There is no distension.  ?   Palpations: Abdomen is soft.  ?   Tenderness: There is no abdominal tenderness.  ?Musculoskeletal:     ?   General: Normal range of motion.  ?   Cervical back: Normal range of motion and neck supple.  ?Skin: ?   General: Skin is warm and dry.  ?   Coloration: Skin is pale.  ?Neurological:  ?   Mental Status: He is alert and oriented to person, place, and time.  ?   Coordination: Coordination normal.  ? ? ?ED Results / Procedures / Treatments   ?Labs ?(all labs ordered are listed, but only abnormal results are displayed) ?Labs Reviewed  ?COMPREHENSIVE METABOLIC PANEL - Abnormal; Notable for the following components:  ?    Result Value  ? Glucose, Bld 115 (*)   ? Calcium 8.1 (*)   ? Total Protein 6.2 (*)   ? Albumin 3.1 (*)   ? AST 13 (*)   ? Total Bilirubin 0.1 (*)   ? Anion gap 4 (*)   ? All other components within normal limits  ?CBC WITH DIFFERENTIAL/PLATELET - Abnormal; Notable for the following components:  ? RBC 2.18 (*)   ? Hemoglobin 4.0 (*)   ? HCT 16.7 (*)   ? MCV 76.6 (*)   ? MCH 18.3 (*)   ? MCHC 24.0 (*)   ? RDW 19.1 (*)   ? nRBC 0.6 (*)   ? All other components within normal limits  ?BRAIN NATRIURETIC PEPTIDE - Abnormal; Notable for the following components:  ? B Natriuretic Peptide 616.2 (*)   ? All other components within normal limits  ?POC OCCULT BLOOD, ED  ?TYPE AND SCREEN  ? ? ?EKG ?None ? ?Radiology ?DG Chest 2 View ? ?Result Date: 04/11/2022 ?CLINICAL DATA:  Shortness of breath. EXAM: CHEST - 2 VIEW COMPARISON:  09/14/2020. FINDINGS: The heart size and mediastinal contours are stable. No consolidation, effusion, or pneumothorax. A calcified granuloma is present at the left lung base. Minimal atelectasis or scarring is noted at the left lung base. A multi lead pacemaker device is present over the left chest. Degenerative changes are present in the thoracic spine. No acute osseous abnormality.  IMPRESSION: Mild atelectasis or scarring at the left lung base. Electronically Signed   By: [name] M.D.   On: 04/11/2022 21:57   ? ?Procedures ?Procedures  ? ? ?Medications Ordered in ED ?Medications  ?0.9 %  sodium chloride infusion (has no administration in time range)  ? ? ?ED Course/ Medical Decision Making/ A&P ? ?This patient presents to the  ED for concern of weakness/dyspnea on exertion, this involves an extensive number of treatment options, and is a complaint that carries with it a high risk of complications and morbidity.  The differential diagnosis includes symptomatic anemia, congestive heart failure ? ? ?Co morbidities that complicate the patient evaluation ? ?None ? ? ?Additional history obtained: ? ?No additional history or external records needed ? ? ?Lab Tests: ? ?I Ordered, and personally interpreted labs.  The pertinent results include: CBC showing hemoglobin of 4, Hemoccult positive stool.  BNP is moderately elevated at 616, but patient does not appear volume overloaded ? ? ?Imaging Studies ordered: ? ?I ordered imaging studies including chest x-ray ?I independently visualized and interpreted imaging which showed mild atelectasis, but no acute process ?I agree with the radiologist interpretation ? ? ?Cardiac Monitoring: / EKG: ? ?The patient was maintained on a cardiac monitor.  I personally viewed and interpreted the cardiac monitored which showed an underlying rhythm of: Sinus rhythm ? ? ?Consultations Obtained: ? ?I requested consultation with the hospitalist,  and discussed lab and imaging findings as well as pertinent plan - they recommend: Admission for blood transfusion and further observation ? ? ?Problem List / ED Course / Critical interventions / Medication management ? ?Patient sent for low hemoglobin after presenting to the doctor's office with dyspnea on exertion worsening over the past 2 weeks.  Hemoglobin this evening is 4.0.  Patient to be transfused 3 units of packed red  cells and to be admitted to the hospitalist service.  I spoke with Dr. Hal Hope who agrees to admit ?I have reviewed the patients home medicines and have made adjustments as needed ? ? ?Social Determinants of Heal

## 2022-04-12 NOTE — Consult Note (Addendum)
? ?                                            Consultation Note ? ? ?Referring Provider: [facility] ?PCP: Patient, [name] ?Primary Gastroenterologist: unassigned ?Reason for consultation: anemia, FOBT + stool  ?Hospital Day: 2 ? ?ASSESSMENT:  ? ?61 yo male with extensive cardiac history including AFib on Eliquis, ventricular tachycardia, ICD placement, CAD s/p stenting on plavix, chronic systolic and diastolic heart failure.  ?Recent worsening of heart failure (EF 35-40%) on Echo. Also with multiple VT  changes. Scheduled for cardiac cath next week.  ? ?Profound microcytic anemia / FOBT + on Eliquis and Plavix.  ?Hgb 4.0. It was 12.4 in December 2021 ( no interval labs) ?Stool chronically dark on iron (he was iron deficient in 2019, on oral iron since). ? ?OSA on CPAP ? ?See PMH for additional medical problems ? ? ?PLAN:  ? ?He needs an EGD / colonoscopy.  The risks and benefits of EGD and colonoscopy with possible biopsies / polypectomy were discussed and the patient agrees to proceed.  ?Last dose of Elqiuis and Plavix was yesterday morning. Ideally should have plavix washout prior to proceeding for procedures. Will give diet and plan for procedures early next week but will like Cardiology clearance for procedures ?Blood transfusion in progress ? ? ?Attending Physician Note  ? ?I have taken a history, reviewed the chart and examined the patient. I performed a substantive portion of this encounter, including complete performance of at least one of the key components, in conjunction with the APP. I agree with the APP's note, impression and recommendations with my edits. My additional impressions and recommendations are as follows.  ? ?*Severe microcytic anemia (presumed IDA), heme + stool. R/O GI tract neoplasm, ulcer, AVMs, other lesions.  ?*Extensive cardiac history including VT, S/P ICD, afib, CAD with stent, heart failure.  ? ?*Transfusions to maintain Hb > 7-8 ?*Consider IV iron, defer  to primary service  ?*Please consult cardiology for endoscopic procedure risk stratification, mgmt recommendations and for cath plans.  ?*Colonoscopy, EGD after cardiology consult and Eliquis, Plavix wash out.  ? ?Lucio Edward, MD [name] ?See [facility], [facility], for our on call provider  ? ? ? ?History of Present Illness:  ?Saint Takao Lizer is a 61 y.o. male  with a past medical history significant for CAD s/p stenting, ischemic cardiomyopathy,  chronic systolic and diastolic heart failure, Afib, sustained VT, ICD placement,  OSA on CPAP See PMH for any additional medical problems. ? ?[place] has recurrent VT. Recent echo showed worsening LV dysfunction. He is scheduled for a heart cath next week. Labs drawn yesterday showed a hgb of 4 ( down from 12.1 in December 2021). He was sent to ED. He has been [facility]. No chest pain.  ? ?Back in 2019 his ferritin was 3, it improved to 1000 on iron.  According to office notes he was advised to see a GI with [facility] to evaluate anemia but he never went. Still has never had endoscopic evaluation of anemia.  He has remained on oral iron and stools are dark but otherwise normal.  No abdominal pain . No nausea / vomiting. No GERD. No NSAID use. No Josephine of colon cancer.   ? ?Previous GI Evaluation / History   ?none ? ?Recent Labs and Imaging ?DG Chest 2 View ? ?  Result Date: 04/11/2022 ?CLINICAL DATA:  Shortness of breath. EXAM: CHEST - 2 VIEW COMPARISON:  09/14/2020. FINDINGS: The heart size and mediastinal contours are stable. No consolidation, effusion, or pneumothorax. A calcified granuloma is present at the left lung base. Minimal atelectasis or scarring is noted at the left lung base. A multi lead pacemaker device is present over the left chest. Degenerative changes are present in the thoracic spine. No acute osseous abnormality. IMPRESSION: Mild atelectasis or scarring at the left lung base. Electronically Signed   By: [name] M.D.   On: 04/11/2022 21:57   ? ?ECHOCARDIOGRAM COMPLETE ? ?Result Date: 04/08/2022 ?   ECHOCARDIOGRAM REPORT   Patient Name:   [name] Date of Exam: 04/07/2022 Medical Rec #:  [id number]                 Height:       78.0 in Accession #:    [id number]                Weight:       219.4 lb Date of Birth:  [birth year]                 BSA:          2.348 m? Patient Age:    60 years                  BP:           114/80 mmHg Patient Gender: M                         HR:           70 bpm. Exam Location:  [place] Procedure: 2D Echo, 3D Echo, Cardiac Doppler and Color Doppler                               MODIFIED REPORT: This report was modified by Dorris Carnes MD on 04/08/2022 due to Complete LVEF.  Indications:     I25.5 Ischemic Cardiomyopathy  History:         Patient has prior history of Echocardiogram examinations, most                  recent 09/22/2020. CHF, CAD, Arrythmias:Atrial Fibrillation,                  Signs/Symptoms:Dizziness/Lightheadedness, Shortness of Breath                  and Fatigue; Risk Factors:Family History of Coronary Artery                  Disease, Hypertension, Dyslipidemia, Former Smoker and Sleep                  Apnea. Ischemic Cardiomyopathy (prior EF 40-45%).  Sonographer:     Deliah Boston RDCS Referring Phys:  Evans Lance Diagnosing Phys: Dorris Carnes MD IMPRESSIONS  1. 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  2. LV function is depressed with hypokinesis of the lateral wall, mid/distal inferior wall and apex; akinesis with aneurysmal dilitation of inferior base. . Left ventricular ejection fraction, by estimation, is 35 to 40%. The left ventricle has moderately decreased function. The left ventricular internal cavity size was moderately dilated. There is mild left ventricular  hypertrophy. Left ventricular diastolic parameters are indeterminate.  3. Right ventricular systolic function is normal. The  right ventricular size is normal. There is mildly elevated pulmonary artery systolic pressure.  4. Left atrial size was severely dilated.  5. Right atrial size was mildly dilated.  6. The mitral valve is normal in structure. Mild mitral valve regurgitation.  7. The aortic valve is tricuspid. Aortic valve regurgitation is not visualized. Aortic valve sclerosis/calcification is present, without any evidence of aortic stenosis.  8. There is borderline dilatation of the aortic root, measuring 39 mm. There is borderline dilatation of the ascending aorta, measuring 39 mm. FINDINGS  Left Ventricle: LV function is depressed with hypokinesis of the lateral wall, mid/distal inferior wall and apex; akinesis with aneurysmal dilitation of inferior base. Left ventricular ejection fraction, by estimation, is 35 to 40%. The left ventricle has moderately decreased function. The left ventricular internal cavity size was moderately dilated. There is mild left ventricular hypertrophy. Left ventricular diastolic parameters are indeterminate. Right Ventricle: The right ventricular size is normal. Right vetricular wall thickness was not assessed. Right ventricular systolic function is normal. There is mildly elevated pulmonary artery systolic pressure. The tricuspid regurgitant velocity is 2.98 m/s, and with an assumed right atrial pressure of 8 mmHg, the estimated right ventricular systolic pressure is 78.9 mmHg. Left Atrium: Left atrial size was severely dilated. Right Atrium: Right atrial size was mildly dilated. Pericardium: There is no evidence of pericardial effusion. Mitral Valve: The mitral valve is normal in structure. Mild mitral valve regurgitation. Tricuspid Valve: The tricuspid valve is normal in structure. Tricuspid valve regurgitation is trivial. Aortic Valve: The aortic valve is tricuspid. Aortic valve regurgitation is not visualized. Aortic valve sclerosis/calcification is present, without any evidence of aortic stenosis.  Pulmonic Valve: The pulmonic valve was normal in structure. Pulmonic valve regurgitation is trivial. Aorta: The aortic root and ascending aorta are structurally normal, with no evidence of dilitation. There is borderlin

## 2022-04-12 NOTE — ED Notes (Signed)
RN called Respiratory Therapist for application of pt's ordered nighttime CPAP ?

## 2022-04-12 NOTE — H&P (Signed)
?History and Physical  ? ? ?Gregory [name] GEZ:[id number] DOB: Apr 19, 1961 DOA: 04/11/2022 ? ?PCP: Patient, [name]  ?Patient coming from: Home. ? ?Chief Complaint: Low hemoglobin. ? ?HPI: Gregory [name] is a 61 y.o. male with history of CAD status post stenting, chronic systolic HF, recurrent VTE, atrial fibrillation, diabetes mellitus type 2 had blood work done by his cardiologist and was found to hemoglobin around 4 was advised to come to the ER.  Patient states over the last few weeks he has been feeling increasingly weak and fatigued and short of breath on exertion.  Denies chest pain nausea vomiting diarrhea.  Patient states he takes iron pills and has always had dark bowel movement.  Never had a colonoscopy or EGD. ? ?ED Course: In the ER blood work confirms hemoglobin to be around 4.  Stool for occult blood was positive.  Patient has been admitted for further work-up for symptomatic anemia and GI bleed.  3 units of PRBC transfusion ordered.  Last dose of Eliquis was yesterday morning on April 11, 2022. ? ?Review of Systems: As per HPI, rest all negative. ? ? ?Past Medical History:  ?Diagnosis Date  ? CAD (coronary artery disease) 12/01/2013  ? Chronic combined systolic and diastolic CHF, NYHA class 1 ([place]) 12/01/2013  ? Erectile dysfunction 12/01/2013  ? HTN (hypertension) 12/01/2013  ? Hyperlipidemia 12/01/2013  ? Ischemic cardiomyopathy 12/01/2013  ? Sleep apnea   ? ? ?Past Surgical History:  ?Procedure Laterality Date  ? ACHILLES TENDON REPAIR    ? lft foot  ? CARDIAC CATHETERIZATION N/A 05/05/2016  ? Procedure: Left Heart Cath and Coronary Angiography;  Surgeon: Leonie Man, MD;  Location: [facility] [facility];  Service: Cardiovascular;  Laterality: N/A;  ? CARDIAC DEFIBRILLATOR PLACEMENT    ? CARDIOVERSION N/A 09/13/2020  ? Procedure: CARDIOVERSION;  Surgeon: Werner Lean, MD;  Location: [facility];  Service: Cardiovascular;  Laterality: N/A;  ?  CARDIOVERSION N/A 09/19/2020  ? Procedure: CARDIOVERSION;  Surgeon: [name], MD;  Location: [facility];  Service: Cardiovascular;  Laterality: N/A;  ? EP IMPLANTABLE DEVICE N/A 12/19/2016  ? Procedure: ICD Generator Changeout;  Surgeon: [name], MD;  Location: [place] [facility];  Service: Cardiovascular;  Laterality: N/A;  ? RIGHT/LEFT HEART CATH AND CORONARY ANGIOGRAPHY N/A 09/24/2020  ? Procedure: RIGHT/LEFT HEART CATH AND CORONARY ANGIOGRAPHY;  Surgeon: Lorretta Harp, MD;  Location: [place] [facility];  Service: Cardiovascular;  Laterality: N/A;  ? WRIST TENODESIS    ? ? ? reports that he quit smoking about 8 years ago. His smoking use included cigarettes. He has never used smokeless tobacco. He reports that he does not currently use alcohol. He reports that he does not use drugs. ? ?No Known Allergies ? ?Family History  ?Problem Relation Age of Onset  ? Heart disease Mother   ? Brain cancer Father   ? Hypertension Daughter   ? ? ?Prior to Admission medications   ?Medication Sig Start Date End Date Taking? Authorizing Provider  ?amiodarone (PACERONE) 200 MG tablet Take 1 tablet (200 mg total) by mouth daily. 03/10/22  Yes [name], MD  ?apixaban (ELIQUIS) 5 MG TABS tablet TAKE 1 TABLET(5 MG) BY MOUTH TWICE DAILY ?Patient taking differently: Take 5 mg by mouth 2 (two) times daily. 04/11/22  Yes Croitoru, [name], MD  ?atorvastatin (LIPITOR) 80 MG tablet Take 1 tablet (80 mg total) by mouth every evening. 04/16/21  Yes Croitoru, Dani Gobble, MD  ?clopidogrel (  PLAVIX) 75 MG tablet TAKE 1 TABLET(75 MG) BY MOUTH DAILY ?Patient taking differently: Take 75 mg by mouth daily. TAKE 1 TABLET(75 MG) BY MOUTH DAILY 10/02/21  Yes Croitoru, [name], MD  ?ezetimibe (ZETIA) 10 MG tablet Take 1 tablet (10 mg total) by mouth daily. 03/13/22  Yes Croitoru, [name], MD  ?FARXIGA 10 MG TABS tablet TAKE 1 TABLET(10 MG) BY MOUTH DAILY BEFORE BREAKFAST ?Patient taking differently: Take 10 mg by mouth daily. 04/07/22  Yes  Croitoru, [name], MD  ?furosemide (LASIX) 20 MG tablet Take 2 tablets (40 mg total) by mouth 3 (three) times a week. ?Patient taking differently: Take 40 mg by mouth every Monday, Wednesday, and Friday. 03/12/22  Yes [name], MD  ?levETIRAcetam (KEPPRA) 500 MG tablet TAKE 1 TABLET(500 MG) BY MOUTH TWICE DAILY ?Patient taking differently: Take 500 mg by mouth 2 (two) times daily. 04/11/22  Yes [name], MD  ?metoprolol succinate (TOPROL-XL) 25 MG 24 hr tablet Take one tablet, 25 mg, in the morning and 50 mg, two tablets, in the evening. ?Patient taking differently: 25 mg. Per patient taking 25 mg in the morning and 25 mg at night 06/27/21  Yes Croitoru, [name], MD  ?pantoprazole (PROTONIX) 40 MG tablet TAKE 1 TABLET(40 MG) BY MOUTH DAILY ?Patient taking differently: Take 40 mg by mouth daily. TAKE 1 TABLET(40 MG) BY MOUTH DAILY 03/13/22  Yes Croitoru, [name], MD  ?potassium chloride (KLOR-CON M) 10 MEQ tablet Take 1 tablet (10 mEq total) by mouth daily. 12/02/21  Yes Croitoru, [name], MD  ?sacubitril-valsartan (ENTRESTO) 49-51 MG Take 1 tablet by mouth 2 (two) times daily. 10/07/21  Yes Croitoru, [name], MD  ?sildenafil (VIAGRA) 50 MG tablet Take 1 tablet (50 mg total) by mouth as needed for erectile dysfunction. 01/25/21  Yes Shirley Friar, PA-C  ?melatonin 5 MG TABS Take 1 tablet (5 mg total) by mouth at bedtime. ?Patient not taking: Reported on 04/12/2022 10/10/20   Bary Leriche, PA-C  ?oxyCODONE (OXY IR/ROXICODONE) 5 MG immediate release tablet Take 1 tablet (5 mg total) by mouth every 12 (twelve) hours as needed for severe pain. ?Patient not taking: Reported on 04/12/2022 10/10/20   Bary Leriche, PA-C  ?traZODone (DESYREL) 100 MG tablet TAKE 1 TABLET(100 MG) BY MOUTH AT BEDTIME ?Patient not taking: Reported on 03/21/2022 10/02/21   [name], MD  ? ? ?Physical Exam: ?Constitutional: Moderately built and nourished. ?Vitals:  ? 04/11/22 2026 04/11/22 2356 04/12/22 0121 04/12/22 0137  ?BP:  112/61 (!) 114/59 108/63 106/66  ?Pulse: 70 66 68 69  ?Resp: '18 16 15 18  '$ ?Temp: 98.4 ?F (36.9 ?C) 98 ?F (36.7 ?C) 98.5 ?F (36.9 ?C) 98.4 ?F (36.9 ?C)  ?TempSrc: Oral Oral Oral Oral  ?SpO2: 100% 100% 100% 100%  ? ?Eyes: Anicteric no pallor. ?ENMT: No discharge from the ears eyes nose and mouth. ?Neck: No mass felt.  No neck rigidity. ?Respiratory: No rhonchi or crepitations. ?Cardiovascular: S1-S2 heard. ?Abdomen: Soft nontender bowel sound present. ?Musculoskeletal: No edema. ?Skin: No rash. ?Neurologic: Alert awake oriented to time place and person.  Moves all extremities. ?Psychiatric: Appears normal.  Normal affect. ? ? ?Labs on Admission: I have personally reviewed following labs and imaging studies ? ?CBC: ?Recent Labs  ?Lab 04/11/22 ?1219 04/11/22 ?2107  ?WBC 9.0 6.9  ?NEUTROABS 6.6 4.7  ?HGB 4.1* 4.0*  ?HCT 16.0* 16.7*  ?MCV 71* 76.6*  ?PLT 353 351  ? ?Basic Metabolic Panel: ?Recent Labs  ?Lab 04/11/22 ?1219 04/11/22 ?2107  ?  NA 137 137  ?K 5.1 4.6  ?CL 105 110  ?CO2 21 23  ?GLUCOSE 108* 115*  ?BUN 13 14  ?CREATININE 0.91 1.11  ?CALCIUM 8.1* 8.1*  ? ?GFR: ?CrCl cannot be calculated (Unknown ideal weight.). ?Liver Function Tests: ?Recent Labs  ?Lab 04/11/22 ?2107  ?AST 13*  ?ALT 12  ?ALKPHOS 60  ?BILITOT 0.1*  ?PROT 6.2*  ?ALBUMIN 3.1*  ? ?No results for input(s): LIPASE, AMYLASE in the last 168 hours. ?No results for input(s): AMMONIA in the last 168 hours. ?Coagulation Profile: ?No results for input(s): INR, PROTIME in the last 168 hours. ?Cardiac Enzymes: ?No results for input(s): CKTOTAL, CKMB, CKMBINDEX, TROPONINI in the last 168 hours. ?BNP (last 3 results) ?No results for input(s): PROBNP in the last 8760 hours. ?HbA1C: ?No results for input(s): HGBA1C in the last 72 hours. ?CBG: ?No results for input(s): GLUCAP in the last 168 hours. ?Lipid Profile: ?No results for input(s): CHOL, HDL, LDLCALC, TRIG, CHOLHDL, LDLDIRECT in the last 72 hours. ?Thyroid Function Tests: ?No results for input(s): TSH,  T4TOTAL, FREET4, T3FREE, THYROIDAB in the last 72 hours. ?Anemia Panel: ?No results for input(s): VITAMINB12, FOLATE, FERRITIN, TIBC, IRON, RETICCTPCT in the last 72 hours. ?Urine analysis: ?   ?Component Value Date/Time  ? COLOR

## 2022-04-12 NOTE — ED Notes (Signed)
Electronic e-signature consent form for blood transfusion signed by pt, available in chart.  ?

## 2022-04-12 NOTE — Progress Notes (Signed)
Pt. Reported to have had 6 beats of VT at 15:04 by tele monitor tech. Pt. Asymptomatic. Dr. Avon Gully notified. No new orders received.  ? ? ? ?

## 2022-04-13 DIAGNOSIS — K922 Gastrointestinal hemorrhage, unspecified: Secondary | ICD-10-CM | POA: Diagnosis not present

## 2022-04-13 LAB — GLUCOSE, CAPILLARY
Glucose-Capillary: 110 mg/dL — ABNORMAL HIGH (ref 70–99)
Glucose-Capillary: 117 mg/dL — ABNORMAL HIGH (ref 70–99)
Glucose-Capillary: 125 mg/dL — ABNORMAL HIGH (ref 70–99)

## 2022-04-13 LAB — TYPE AND SCREEN
ABO/RH(D): B POS
Antibody Screen: NEGATIVE
Unit division: 0
Unit division: 0
Unit division: 0
Unit division: 0

## 2022-04-13 LAB — BASIC METABOLIC PANEL
Anion gap: 3 — ABNORMAL LOW (ref 5–15)
BUN: 12 mg/dL (ref 8–23)
CO2: 23 mmol/L (ref 22–32)
Calcium: 8 mg/dL — ABNORMAL LOW (ref 8.9–10.3)
Chloride: 111 mmol/L (ref 98–111)
Creatinine, Ser: 0.77 mg/dL (ref 0.61–1.24)
GFR, Estimated: >60 mL/min (ref >60)
Glucose, Bld: 110 mg/dL — ABNORMAL HIGH (ref 70–99)
Potassium: 4.2 mmol/L (ref 3.5–5.1)
Sodium: 137 mmol/L (ref 135–145)

## 2022-04-13 LAB — BPAM RBC
Blood Product Expiration Date: 202305152359
Blood Product Expiration Date: 202305172359
Blood Product Expiration Date: 202305172359
Blood Product Expiration Date: 202305172359
ISSUE DATE / TIME: 202304290112
ISSUE DATE / TIME: 202304290501
ISSUE DATE / TIME: 202304290841
ISSUE DATE / TIME: 202304291610
Unit Type and Rh: 7300
Unit Type and Rh: 7300
Unit Type and Rh: 7300
Unit Type and Rh: 7300

## 2022-04-13 LAB — CBC
HCT: 25.3 % — ABNORMAL LOW (ref 39.0–52.0)
Hemoglobin: 7.4 g/dL — ABNORMAL LOW (ref 13.0–17.0)
MCH: 22.6 pg — ABNORMAL LOW (ref 26.0–34.0)
MCHC: 29.2 g/dL — ABNORMAL LOW (ref 30.0–36.0)
MCV: 77.4 fL — ABNORMAL LOW (ref 80.0–100.0)
Platelets: 257 10*3/uL (ref 150–400)
RBC: 3.27 MIL/uL — ABNORMAL LOW (ref 4.22–5.81)
RDW: 18.7 % — ABNORMAL HIGH (ref 11.5–15.5)
WBC: 8 10*3/uL (ref 4.0–10.5)
nRBC: 0.5 % — ABNORMAL HIGH (ref 0.0–0.2)

## 2022-04-13 MED ORDER — DIPHENHYDRAMINE HCL 25 MG PO CAPS
25.0000 mg | ORAL_CAPSULE | Freq: Every evening | ORAL | Status: DC | PRN
  Administered 2022-04-13 – 2022-04-24 (×7): 25 mg via ORAL
  Filled 2022-04-13 (×7): qty 1

## 2022-04-13 MED ORDER — INSULIN ASPART 100 UNIT/ML IJ SOLN
0.0000 [IU] | Freq: Three times a day (TID) | INTRAMUSCULAR | Status: DC
  Administered 2022-04-22: 1 [IU] via SUBCUTANEOUS

## 2022-04-13 NOTE — Progress Notes (Signed)
Pt states he will place himself on CPAP. I encouraged pt to call if he needs any help. He stated he felt like the mask was big. Mask was changed out for a medium to replace the large mask. Tried on patient and he states, seems better.  ?

## 2022-04-13 NOTE — Progress Notes (Signed)
?PROGRESS NOTE ? ? ? ?Gregory [name]  GEZ:[id number] DOB: 1961-09-28 DOA: 04/11/2022 ?PCP: Patient, [name] ? ? ?Brief Narrative:  ?Gregory [name] is a 61 y.o. male with history of CAD status post stenting, chronic systolic HF, recurrent VTE, atrial fibrillation, diabetes mellitus type 2 had blood work done by his cardiologist and was found to hemoglobin around 4 was advised to come to the ER.  Patient states over the last few weeks he has been feeling increasingly weak and fatigued and short of breath on exertion.  Denies chest pain nausea vomiting diarrhea. Patient states he takes iron pills and has always had dark bowel movements.  Never had a colonoscopy or EGD. ? ?Assessment & Plan: ?  ?Principal Problem: ?  Acute GI bleeding ?Active Problems: ?  Ischemic cardiomyopathy ?  CAD S/P percutaneous coronary angioplasty ?  Essential hypertension ?  OSA on CPAP ?  Seizure disorder ([place]) ?  Symptomatic anemia ? ?Profound acute symptomatic anemia with concurrent GI bleed  ?-Hemoglobin 4.0 at intake, blood transfusion, hemoglobin stabilizing follow repeat labs ?-Pending repeat hemoglobin may require additional transfusion, follow clinically  ?-Continue to hold Eliquis and Plavix ?-[facility] to follow, will need washout for anticoagulation and antiplatelets ?-Advance diet and procedure per GI recommendations ? ?History of chronic systolic CHF appears compensated. ?-Holding Lasix for now.  ?-Continue Entresto.  Status post ICD. ? ?History of recurrent VT on amiodarone.  ?-Cardiology is planning further interventions in the near future ?-[facility] in the morning to discuss clearance for procedure versus need to delay for further cardiac work-up. ? ?History of A-fib in sinus rhythm on metoprolol and amiodarone ?- Eliquis on hold secondary to above. ? ?Diet controlled diabetes mellitus type 2  ?Continue sliding scale insulin/ ? ?Seizure disorder on Keppra. ?Sleep apnea on  CPAP. ?CAD status post tenting denies any chest pain.  Holding Plavix due to GI bleed. Outpatient diagnostic cath planned next week (04/15/22). ?  ?DVT prophylaxis: SCDs. ?Code Status: Full code. ?Family Communication: Discussed with patient. ? ?Status is: Inpatient ? ?Dispo: The patient is from: Home ?             Anticipated d/c is to: Home ?             Anticipated d/c date is: 48 to 72 hours ?             Patient currently not medically stable for discharge ? ?Consultants:  ?GI,  ? ?Procedures:  ?Pending ? ?Antimicrobials:  ?None ? ?Subjective: ?No acute issues or events overnight, denies nausea vomiting diarrhea constipation headache fevers chills or chest pain ? ?Objective: ?Vitals:  ? 04/12/22 2127 04/13/22 0419 04/13/22 0740 04/13/22 0741  ?BP: 128/87 118/76 131/89 131/89  ?Pulse: 62 (!) 58 61 62  ?Resp: '19 16 16 17  '$ ?Temp: 98.9 ?F (37.2 ?C) 98.3 ?F (36.8 ?C) 97.8 ?F (36.6 ?C)   ?TempSrc: Oral Oral Oral   ?SpO2: 99% 98% 98% 97%  ?Weight:      ?Height:      ? ? ?Intake/Output Summary (Last 24 hours) at 04/13/2022 0756 ?Last data filed at 04/13/2022 0419 ?Gross per 24 hour  ?Intake 2230 ml  ?Output 1400 ml  ?Net 830 ml  ? ? ?Filed Weights  ? 04/12/22 0350  ?Weight: 95.8 kg  ? ? ?Examination: ? ?General:  Pleasantly resting in bed, No acute distress. ?Lungs:  Clear to auscultate bilaterally without rhonchi, wheeze, or rales. ?Heart:  Regular rate and rhythm.  Without murmurs, rubs, or gallops. ?Abdomen:  Soft, nontender, nondistended.  Without guarding or rebound. ?Extremities: Without cyanosis, clubbing, edema, or obvious deformity. ?Vascular:  Dorsalis pedis and posterior tibial pulses palpable bilaterally. ?Skin:  Warm and dry, no erythema, no ulcerations. ? ?Data Reviewed: I have personally reviewed following labs and imaging studies ? ?CBC: ?Recent Labs  ?Lab 04/11/22 ?1219 04/11/22 ?2107 04/12/22 ?1356 04/12/22 ?2119 04/13/22 ?6195  ?WBC 9.0 6.9  --   --  8.0  ?NEUTROABS 6.6 4.7  --   --   --   ?HGB  4.1* 4.0* 6.6* 7.4* 7.4*  ?HCT 16.0* 16.7* 22.0* 25.5* 25.3*  ?MCV 71* 76.6*  --   --  77.4*  ?PLT 353 351  --   --  257  ? ? ?Basic Metabolic Panel: ?Recent Labs  ?Lab 04/11/22 ?1219 04/11/22 ?2107 04/13/22 ?0332  ?NA 137 137 137  ?K 5.1 4.6 4.2  ?CL 105 110 111  ?CO2 '21 23 23  '$ ?GLUCOSE 108* 115* 110*  ?BUN '13 14 12  '$ ?CREATININE 0.91 1.11 0.77  ?CALCIUM 8.1* 8.1* 8.0*  ? ? ?GFR: ?Estimated Creatinine Clearance: 122.2 mL/min (by C-G formula based on SCr of 0.77 mg/dL). ?Liver Function Tests: ?Recent Labs  ?Lab 04/11/22 ?2107  ?AST 13*  ?ALT 12  ?ALKPHOS 60  ?BILITOT 0.1*  ?PROT 6.2*  ?ALBUMIN 3.1*  ? ? ?No results for input(s): LIPASE, AMYLASE in the last 168 hours. ?No results for input(s): AMMONIA in the last 168 hours. ?Coagulation Profile: ?No results for input(s): INR, PROTIME in the last 168 hours. ?Cardiac Enzymes: ?No results for input(s): CKTOTAL, CKMB, CKMBINDEX, TROPONINI in the last 168 hours. ?BNP (last 3 results) ?No results for input(s): PROBNP in the last 8760 hours. ?HbA1C: ?No results for input(s): HGBA1C in the last 72 hours. ?CBG: ?Recent Labs  ?Lab 04/12/22 ?1156 04/12/22 ?1726 04/12/22 ?1951 04/13/22 ?0004 04/13/22 ?0416  ?GLUCAP 98 176* 134* 125* 117*  ? ? ?Lipid Profile: ?No results for input(s): CHOL, HDL, LDLCALC, TRIG, CHOLHDL, LDLDIRECT in the last 72 hours. ?Thyroid Function Tests: ?No results for input(s): TSH, T4TOTAL, FREET4, T3FREE, THYROIDAB in the last 72 hours. ?Anemia Panel: ?No results for input(s): VITAMINB12, FOLATE, FERRITIN, TIBC, IRON, RETICCTPCT in the last 72 hours. ?Sepsis Labs: ?No results for input(s): PROCALCITON, LATICACIDVEN in the last 168 hours. ? ?No results found for this or any previous visit (from the past 240 hour(s)).  ? ? ? ? ? ?Radiology Studies: ?DG Chest 2 View ? ?Result Date: 04/11/2022 ?CLINICAL DATA:  Shortness of breath. EXAM: CHEST - 2 VIEW COMPARISON:  09/14/2020. FINDINGS: The heart size and mediastinal contours are stable. No consolidation,  effusion, or pneumothorax. A calcified granuloma is present at the left lung base. Minimal atelectasis or scarring is noted at the left lung base. A multi lead pacemaker device is present over the left chest. Degenerative changes are present in the thoracic spine. No acute osseous abnormality. IMPRESSION: Mild atelectasis or scarring at the left lung base. Electronically Signed   By: [name] M.D.   On: 04/11/2022 21:57   ? ? ? ? ? ?Scheduled Meds: ? sodium chloride   Intravenous Once  ? amiodarone  200 mg Oral Daily  ? atorvastatin  80 mg Oral QPM  ? ezetimibe  10 mg Oral Daily  ? insulin aspart  0-6 Units Subcutaneous Q4H  ? levETIRAcetam  500 mg Oral BID  ? metoprolol succinate  25 mg Oral BID  ? pantoprazole (PROTONIX) IV  40 mg  Intravenous Q12H  ? sacubitril-valsartan  1 tablet Oral BID  ? ?Continuous Infusions: ? ? LOS: 1 day  ? ?Time spent: 69mn ? ?WLittle Ishikawa DO ?[facility] ? ?If 7PM-7AM, please contact night-coverage [email or web address] ? ?04/13/2022, 7:56 AM   ? ? ? ?

## 2022-04-14 DIAGNOSIS — I10 Essential (primary) hypertension: Secondary | ICD-10-CM

## 2022-04-14 DIAGNOSIS — D509 Iron deficiency anemia, unspecified: Secondary | ICD-10-CM

## 2022-04-14 DIAGNOSIS — I2510 Atherosclerotic heart disease of native coronary artery without angina pectoris: Secondary | ICD-10-CM | POA: Diagnosis not present

## 2022-04-14 DIAGNOSIS — Z9861 Coronary angioplasty status: Secondary | ICD-10-CM

## 2022-04-14 DIAGNOSIS — R195 Other fecal abnormalities: Secondary | ICD-10-CM | POA: Diagnosis not present

## 2022-04-14 DIAGNOSIS — I255 Ischemic cardiomyopathy: Secondary | ICD-10-CM

## 2022-04-14 DIAGNOSIS — Z7901 Long term (current) use of anticoagulants: Secondary | ICD-10-CM

## 2022-04-14 DIAGNOSIS — D649 Anemia, unspecified: Secondary | ICD-10-CM | POA: Diagnosis not present

## 2022-04-14 DIAGNOSIS — K922 Gastrointestinal hemorrhage, unspecified: Secondary | ICD-10-CM | POA: Diagnosis not present

## 2022-04-14 DIAGNOSIS — I480 Paroxysmal atrial fibrillation: Secondary | ICD-10-CM

## 2022-04-14 LAB — BASIC METABOLIC PANEL
Anion gap: 5 (ref 5–15)
BUN: 14 mg/dL (ref 8–23)
CO2: 23 mmol/L (ref 22–32)
Calcium: 7.9 mg/dL — ABNORMAL LOW (ref 8.9–10.3)
Chloride: 108 mmol/L (ref 98–111)
Creatinine, Ser: 0.9 mg/dL (ref 0.61–1.24)
GFR, Estimated: >60 mL/min (ref >60)
Glucose, Bld: 116 mg/dL — ABNORMAL HIGH (ref 70–99)
Potassium: 4.1 mmol/L (ref 3.5–5.1)
Sodium: 136 mmol/L (ref 135–145)

## 2022-04-14 LAB — GLUCOSE, CAPILLARY
Glucose-Capillary: 122 mg/dL — ABNORMAL HIGH (ref 70–99)
Glucose-Capillary: 138 mg/dL — ABNORMAL HIGH (ref 70–99)
Glucose-Capillary: 105 mg/dL — ABNORMAL HIGH (ref 70–99)
Glucose-Capillary: 115 mg/dL — ABNORMAL HIGH (ref 70–99)

## 2022-04-14 LAB — CBC
HCT: 25.1 % — ABNORMAL LOW (ref 39.0–52.0)
Hemoglobin: 7.2 g/dL — ABNORMAL LOW (ref 13.0–17.0)
MCH: 22.6 pg — ABNORMAL LOW (ref 26.0–34.0)
MCHC: 28.7 g/dL — ABNORMAL LOW (ref 30.0–36.0)
MCV: 78.7 fL — ABNORMAL LOW (ref 80.0–100.0)
Platelets: 232 10*3/uL (ref 150–400)
RBC: 3.19 MIL/uL — ABNORMAL LOW (ref 4.22–5.81)
RDW: 19.6 % — ABNORMAL HIGH (ref 11.5–15.5)
WBC: 6.6 10*3/uL (ref 4.0–10.5)
nRBC: 0.3 % — ABNORMAL HIGH (ref 0.0–0.2)

## 2022-04-14 MED ORDER — DAPAGLIFLOZIN PROPANEDIOL 10 MG PO TABS
10.0000 mg | ORAL_TABLET | Freq: Every day | ORAL | Status: DC
  Administered 2022-04-14 – 2022-04-25 (×11): 10 mg via ORAL
  Filled 2022-04-14 (×11): qty 1

## 2022-04-14 NOTE — Progress Notes (Signed)
? ?         Daily Rounding Note ? ?04/14/2022, 2:10 PM ? LOS: 2 days  ? ?SUBJECTIVE:   ?Chief complaint:   Symptomatic anemia requiring transfusion.  CAD. ? ?Patient feeling well after transfusions.  No longer experiencing dyspnea on exertion.  No chest pain.  Not passing any black or bloody stools. ? ?OBJECTIVE:        ? Vital signs in last 24 hours:    ?Temp:  [97.8 ?F (36.6 ?C)-98.5 ?F (36.9 ?C)] 98 ?F (36.7 ?C) (05/01 1126) ?Pulse Rate:  [59-66] 64 (05/01 1126) ?Resp:  [14-19] 18 (05/01 1126) ?BP: (98-124)/(65-84) 103/67 (05/01 1126) ?SpO2:  [97 %-100 %] 98 % (05/01 0814) ?Last BM Date : 04/14/22 ?Filed Weights  ? 04/12/22 0350  ?Weight: 95.8 kg  ? ?General: Looks somewhat chronically ill but comfortable in no distress. ?Heart: RRR.,  SR around 60 on monitor. ?Chest: No labored breathing or cough.  Lungs clear bilaterally ?Abdomen: Not tender.  Soft.  Active bowel sounds.  No distention. ?Extremities: No CCE. ?Neuro/Psych: Calm, pleasant, fluid speech.  No gross deficits, tremors or weakness. ? ?Intake/Output from previous day: ?04/30 0701 - 05/01 0700 ?In: 1080 [P.O.:1080] ?Out: 1500 [Urine:1500] ? ?Intake/Output this shift: ?Total I/O ?In: -  ?Out: 350 [Urine:350] ? ?Lab Results: ?Recent Labs  ?  04/11/22 ?2107 04/12/22 ?1356 04/12/22 ?2119 04/13/22 ?0332 04/14/22 ?6503  ?WBC 6.9  --   --  8.0 6.6  ?HGB 4.0*   < > 7.4* 7.4* 7.2*  ?HCT 16.7*   < > 25.5* 25.3* 25.1*  ?PLT 351  --   --  257 232  ? < > = values in this interval not displayed.  ? ?BMET ?Recent Labs  ?  04/11/22 ?2107 04/13/22 ?0332 04/14/22 ?0358  ?NA 137 137 136  ?K 4.6 4.2 4.1  ?CL 110 111 108  ?CO2 '23 23 23  '$ ?GLUCOSE 115* 110* 116*  ?BUN '14 12 14  '$ ?CREATININE 1.11 0.77 0.90  ?CALCIUM 8.1* 8.0* 7.9*  ? ?LFT ?Recent Labs  ?  04/11/22 ?2107  ?PROT 6.2*  ?ALBUMIN 3.1*  ?AST 13*  ?ALT 12  ?ALKPHOS 60  ?BILITOT 0.1*  ? ?PT/INR ?No results for input(s): LABPROT, INR in the last 72 hours. ?Hepatitis  Panel ?No results for input(s): HEPBSAG, HCVAB, HEPAIGM, HEPBIGM in the last 72 hours. ? ?Studies/Results: ?No results found. ? ?ASSESMENT:  ? ?  Symptomatic acute anemia.  Hgb 4 ... 7.2 after 4 PRBCs.   ? ?  CAD.  S/p cardiac stenting. Hx Afib (currently SR).  Plavix and Eliquis on hold, last doses 4/28.  5 day plavix washout completes 5/3.  Cardiac cath set for 5/2 was canceled due to anemia. ? ?  Ischemic CM.  ICD in situ.   ? ?OSA, on CPAP. ? ? ?PLAN  ? ?  EGD and colonoscopy on 5/3 if cardiology agrees.  Hospitalist note mention we are waiting on cardiac clearance for endoscopic procedures.  Start clear diet in AM ? ? ? ?Azucena Freed  04/14/2022, 2:10 PM ?Phone [phone]  ?

## 2022-04-14 NOTE — Progress Notes (Signed)
?PROGRESS NOTE ? ? ? ?Gregory [name]  QPR:[id number] DOB: Nov 23, 1961 DOA: 04/11/2022 ?PCP: Patient, [name] ? ? ?Brief Narrative:  ?Gregory [name] is a 61 y.o. male with history of CAD status post stenting, chronic systolic HF, recurrent VTE, atrial fibrillation, diabetes mellitus type 2 had blood work done by his cardiologist and was found to hemoglobin around 4 was advised to come to the ER.  Patient states over the last few weeks he has been feeling increasingly weak and fatigued and short of breath on exertion.  Denies chest pain nausea vomiting diarrhea. Patient states he takes iron pills and has always had dark bowel movements.  Never had a colonoscopy or EGD. ? ?Assessment & Plan: ?  ?Principal Problem: ?  Acute GI bleeding ?Active Problems: ?  Ischemic cardiomyopathy ?  CAD S/P percutaneous coronary angioplasty ?  Essential hypertension ?  OSA on CPAP ?  Seizure disorder ([name]) ?  Symptomatic anemia ? ?Profound acute symptomatic anemia with concurrent GI bleed  ?-Hemoglobin 4.0 at intake, blood transfusion, hemoglobin stabilizing follow repeat labs ?-Pending repeat hemoglobin may require additional transfusion, follow clinically  ?-Continue to hold Eliquis and Plavix ?-[facility] to follow, will need washout for anticoagulation and antiplatelets ?-Advance diet and procedure per GI recommendations ?*Currently awaiting cardiac clearance prior to endoscopy per discussion with GI ? ?History of chronic systolic CHF appears compensated. ?-Holding Lasix for now.  ?-Continue Entresto.  Status post ICD. ?-Cardiology following as below ? ?History of recurrent VT on amiodarone.  ?-Cardiology is planning further interventions in the near future ?-Cardiology consulted for further discussion given outpatient cath planned for 04/15/22 ? ?History of A-fib in sinus rhythm on metoprolol and amiodarone ?- Eliquis on hold secondary to above. ? ?Diet controlled diabetes mellitus type 2  ?Continue  sliding scale insulin/ ? ?Seizure disorder on Keppra. ?Sleep apnea on CPAP. ?CAD status post tenting denies any chest pain.  Holding Plavix due to GI bleed. Outpatient diagnostic cath planned next week (04/15/22). ?  ?DVT prophylaxis: SCDs. ?Code Status: Full code. ?Family Communication: Discussed with patient. ? ?Status is: Inpatient ? ?Dispo: The patient is from: Home ?             Anticipated d/c is to: Home ?             Anticipated d/c date is: 48 to 72 hours ?             Patient currently not medically stable for discharge ? ?Consultants:  ?GI, cardiology ? ?Procedures:  ?Pending endoscopy possible cardiac cath as above ? ?Antimicrobials:  ?None ? ?Subjective: ?No acute issues or events overnight, denies nausea vomiting diarrhea constipation headache fevers chills or chest pain ? ?Objective: ?Vitals:  ? 04/13/22 1701 04/13/22 1920 04/14/22 0000 04/14/22 9935  ?BP: 117/76 98/67  111/65  ?Pulse: 63 (!) 59  63  ?Resp: '14 16 19 16  '$ ?Temp: 97.8 ?F (36.6 ?C) 98.5 ?F (36.9 ?C)  98.5 ?F (36.9 ?C)  ?TempSrc: Oral Oral  Oral  ?SpO2: 100% 98%  97%  ?Weight:      ?Height:      ? ? ?Intake/Output Summary (Last 24 hours) at 04/14/2022 0726 ?Last data filed at 04/14/2022 0500 ?Gross per 24 hour  ?Intake 1080 ml  ?Output 1220 ml  ?Net -140 ml  ? ? ?Filed Weights  ? 04/12/22 0350  ?Weight: 95.8 kg  ? ? ?Examination: ? ?General:  Pleasantly resting in bed, No acute distress. ?Lungs:  Clear  to auscultate bilaterally without rhonchi, wheeze, or rales. ?Heart:  Regular rate and rhythm.  Without murmurs, rubs, or gallops. ?Abdomen:  Soft, nontender, nondistended.  Without guarding or rebound. ?Extremities: Without cyanosis, clubbing, edema, or obvious deformity. ?Vascular:  Dorsalis pedis and posterior tibial pulses palpable bilaterally. ?Skin:  Warm and dry, no erythema, no ulcerations. ? ?Data Reviewed: I have personally reviewed following labs and imaging studies ? ?CBC: ?Recent Labs  ?Lab 04/11/22 ?1219 04/11/22 ?2107 04/12/22 ?1356  04/12/22 ?2119 04/13/22 ?1610 04/14/22 ?9604  ?WBC 9.0 6.9  --   --  8.0 6.6  ?NEUTROABS 6.6 4.7  --   --   --   --   ?HGB 4.1* 4.0* 6.6* 7.4* 7.4* 7.2*  ?HCT 16.0* 16.7* 22.0* 25.5* 25.3* 25.1*  ?MCV 71* 76.6*  --   --  77.4* 78.7*  ?PLT 353 351  --   --  257 232  ? ? ?Basic Metabolic Panel: ?Recent Labs  ?Lab 04/11/22 ?1219 04/11/22 ?2107 04/13/22 ?5409 04/14/22 ?8119  ?NA 137 137 137 136  ?K 5.1 4.6 4.2 4.1  ?CL 105 110 111 108  ?CO2 '21 23 23 23  '$ ?GLUCOSE 108* 115* 110* 116*  ?BUN '13 14 12 14  '$ ?CREATININE 0.91 1.11 0.77 0.90  ?CALCIUM 8.1* 8.1* 8.0* 7.9*  ? ? ?GFR: ?Estimated Creatinine Clearance: 108.6 mL/min (by C-G formula based on SCr of 0.9 mg/dL). ?Liver Function Tests: ?Recent Labs  ?Lab 04/11/22 ?2107  ?AST 13*  ?ALT 12  ?ALKPHOS 60  ?BILITOT 0.1*  ?PROT 6.2*  ?ALBUMIN 3.1*  ? ? ?No results for input(s): LIPASE, AMYLASE in the last 168 hours. ?No results for input(s): AMMONIA in the last 168 hours. ?Coagulation Profile: ?No results for input(s): INR, PROTIME in the last 168 hours. ?Cardiac Enzymes: ?No results for input(s): CKTOTAL, CKMB, CKMBINDEX, TROPONINI in the last 168 hours. ?BNP (last 3 results) ?No results for input(s): PROBNP in the last 8760 hours. ?HbA1C: ?No results for input(s): HGBA1C in the last 72 hours. ?CBG: ?Recent Labs  ?Lab 04/12/22 ?1726 04/12/22 ?1951 04/13/22 ?0004 04/13/22 ?0416 04/13/22 ?0800  ?GLUCAP 176* 134* 125* 117* 110*  ? ? ?Lipid Profile: ?No results for input(s): CHOL, HDL, LDLCALC, TRIG, CHOLHDL, LDLDIRECT in the last 72 hours. ?Thyroid Function Tests: ?No results for input(s): TSH, T4TOTAL, FREET4, T3FREE, THYROIDAB in the last 72 hours. ?Anemia Panel: ?No results for input(s): VITAMINB12, FOLATE, FERRITIN, TIBC, IRON, RETICCTPCT in the last 72 hours. ?Sepsis Labs: ?No results for input(s): PROCALCITON, LATICACIDVEN in the last 168 hours. ? ?No results found for this or any previous visit (from the past 240 hour(s)).  ? ? ? ? ? ?Radiology Studies: ?No results  found. ? ? ? ? ? ?Scheduled Meds: ? sodium chloride   Intravenous Once  ? amiodarone  200 mg Oral Daily  ? atorvastatin  80 mg Oral QPM  ? ezetimibe  10 mg Oral Daily  ? insulin aspart  0-6 Units Subcutaneous TID WC  ? levETIRAcetam  500 mg Oral BID  ? metoprolol succinate  25 mg Oral BID  ? pantoprazole (PROTONIX) IV  40 mg Intravenous Q12H  ? sacubitril-valsartan  1 tablet Oral BID  ? ?Continuous Infusions: ? ? LOS: 2 days  ? ?Time spent: 44mn ? ?WLittle Ishikawa DO ?[facility] ? ?If 7PM-7AM, please contact night-coverage ?[email or web address] ? ?04/14/2022, 7:26 AM   ? ? ? ?

## 2022-04-14 NOTE — Progress Notes (Signed)
?  Transition of Care (TOC) Screening Note ? ? ?Patient Details  ?Name: [facility] ?Date of Birth: [birth year] ? ? ?Transition of Care (TOC) CM/SW Contact:    ?Milas Gain, LCSWA ?Phone Number: ?04/14/2022, 5:07 PM ? ? ? ?Transition of Care Department [facility]) has reviewed patient and no TOC needs have been identified at this time. We will continue to monitor patient advancement through interdisciplinary progression rounds. If new patient transition needs arise, please place a TOC consult. ?  ?

## 2022-04-14 NOTE — Consult Note (Addendum)
?Cardiology Consultation:  ? ?Patient ID: Gregory [name] ?MRN: [id number]; DOB: Sep 14, 1961 ? ?Admit date: 04/11/2022 ?Date of Consult: 04/14/2022 ? ?PCP:  Patient, [name] ?  ?[facility] Providers ?Cardiologist:  Sanda Klein, MD  ?Electrophysiologist:  Virl Axe, MD  { ? ? ?Patient Profile:  ? ?Gregory [name] is a 61 y.o. male with a hx of ischemic cardiomyopathy,  chronic systolic and diastolic heart failure, recurrent sustained VT with various morphologies, s/p Medtronic dual chamber ICD, A fib on Eliquis and amiodarone,  IDA, OSA on CPAP, HLD, erectile dysfunction, who is being seen 04/14/2022 for the evaluation of preprocedure cardiac evaluation at the request of Dr Havery Moros.  ? ?History of Present Illness:  ? ?Mr. Schaffer follows Dr Sallyanne Kuster outpatient for cardiology.  ? ?His defibrillator was initially implanted in 2006 , generator was last changed in 2008 (Medtronic Evera XT DR).  May 2017, he had presented to ER with palpitations, visual changes and slight dizziness and found to have sustained monomorphic ventricular tachycardia below ICD detection rate. He underwent urgent cardioversion in the emergency department.  LHC from 05/05/2016 showed chronic occlusion branch of OM and RCA with collaterals,  80% stenosis post atrio lesion, widely patent stent in OM1, D1 and mid LAD, Mild to moderate LV systolic function and severely elevated LVED. Echo from 05/06/16 showed EF 60-65%. Amiodarone was initiated. VT detection rate was decreased to 150 bpm after hospitalization and subsequently decreased to 130 bpm when he returned with recurrent VT at his office appointment with Dr. Caryl Comes on June 27. Pre-shock antitachycardia pacing was made more aggressive. Mexiletine was stopped.  ? ?He was hospitalized in late 9/ 2021 with sepsis and hypovolemia, accompanied by uninterrupted atrial fibrillation with rapid ventricular response for couple of days before that.  Amiodarone was  initiated then he underwent an unsuccessful cardioversion on 09/13/2020.  Had some altered mental status, questionable stroke.  A second attempt at cardioversion, this time successfully on 09/19/2020 09/22/2020 he had monomorphic ventricular tachycardia for which he received therapy from his defibrillator.  He underwent cardiac catheterization on 09/24/2020 confirming the known total occlusion of the right coronary artery and showing that previously placed stents were widely patent.  Note difficulty in radial approach and it was recommended that future coronary angio be performed from the femoral approach.  Filling pressures were normal and there was very mild systolic pulmonary hypertension.  Cardiac output was high.  Repeat Echo on 09/22/2020 showed worsened LVEF 40-45% (was 55-60% from Echo on 09/10/20).  ? ?He was last seen by Dr Sallyanne Kuster on 06/27/21 he reported daily episodes of lightheadedness when standing, these occurring more common at night where he noticed his DBP <60 mmHg.  He has lost about 60 pounds in the last 10 months. He exercises 45 minutes at least 5 or 6 days a week on a stationary bike. He appeared euvolemic and was maximized on Entresto and farxiga dosing and dose not wish to reduce the dosing, due to concern of mild hypovolemia his metoprolol was reduced.  Interrogation of his defibrillator performed that day which showed normal generator and lead parameters.  Estimated generator longevity is 5.5 years.  He had only 13% atrial pacing and never required ventricular pacing.  He has not had any atrial fibrillation since his acute illness 2021.  He had 4 episodes of nonsustained ventricular tachycardia, 1 of which was fairly lengthy and lasted for 16 seconds, on July 4.  It was relatively slow at 155 bpm and did  not trigger device intervention. There is 1: 1 A-V dissociation, but it was fairly clear that was ventricular tachycardia with retrograde atrial activation.  The QRS morphology is identical  with other episodes of nonsustained VT where there is A-V dissociation and is very similar to frequent PVCs that are seen during device interrogation.  In the past he has undergone cardioversion in the emergency room for slow monomorphic VT that was below the ICD detection rate (currently the lowest VT zone is at 150 bpm and requires 100 beats for detection and 48 beats for rate detection. A large inferolateral scar was felt the source of his VT.  ? ?He follows Dr Caryl Comes for his ICD and has known extensive inferolateral scar. He was last seen on 02/27/22 , his OptiVol was elevated.  diuretic was increased to '40mg'$  every other day for 5 days and then resume '20mg'$  daily. VT was occurring in the setting of vigorous exercise, ranolazine was considered but deferred due to volume issue, amiodarone dosing was increased to '400mg'$  BID for 10 days and then '200mg'$  BID for 2 weeks and '200mg'$  daily after .  ? ?He was referred to Dr Lovena Le for VT ablation and was seen on 03/21/22. It was felt his CHF symptoms are worsened for a few weeks and he had recurrent VT episodes. Upon review of the patient's ICD interrogation demonstrated 2 additional VT morphologies and right.  A VT at 188 bpm as well as a VT of almost 300 bpm.  The slower VT was paced terminable.  The faster VT resulted in ICD shock. He had several shocks.  Upon review of his only twelve-lead EKG of ventricular tachycardia from 2017, it demonstrated a right bundle branch block VT with a northwest axis.  The transition from positive to negative was at lead V2.  The above findings suggest a lateral infarction near the apex. It was felt these VT's with their increased rate would be very difficult to map and the likelihood of cure from VT ablation is quite low. He was told to contine high dose amiodarone. Echo was repeated 04/07/22 with slightly worsening EF 35-40%. He was arranged for left heart cath on 04/11/22 for further evaluation.  ? ? ?Patient presented to ER on 04/12/22 with  dyspnea on exertion and fatigue for a few weeks. He had recent blood work done by cardiology before cath. He was told his Hgb was 4 today and instructed to go to ED. He reports chronic dark BM while taking iron pills. He states he is feeling much improve after getting blood transfusion. He denied any chest pain, dizziness, syncope, heart palpitation, leg edema, rapid weight gain. He did not notice any recent ICD therapy over the past year.   ? ?Admission diagnostics Hgb 4. CMP with albumin 3.1, otherwise unremarkable. BNP 616. FOBT +. He was admitted to hospital medicine, given total 4 unit PRBC transfusion. Eliquis and Plavix were held. GI consulted and recommend EGD and colonoscopy, pending wash out of Plavix 04/16/22, cardiology is consulted for pre-procedure cardiac evaluation.  ? ? ?Past Medical History:  ?Diagnosis Date  ? CAD (coronary artery disease) 12/01/2013  ? Chronic combined systolic and diastolic CHF, NYHA class 1 ([place]) 12/01/2013  ? Erectile dysfunction 12/01/2013  ? HTN (hypertension) 12/01/2013  ? Hyperlipidemia 12/01/2013  ? Ischemic cardiomyopathy 12/01/2013  ? Sleep apnea   ? ? ?Past Surgical History:  ?Procedure Laterality Date  ? ACHILLES TENDON REPAIR    ? lft foot  ? CARDIAC CATHETERIZATION N/A 05/05/2016  ?  Procedure: Left Heart Cath and Coronary Angiography;  Surgeon: Leonie Man, MD;  Location: [facility];  Service: Cardiovascular;  Laterality: N/A;  ? CARDIAC DEFIBRILLATOR PLACEMENT    ? CARDIOVERSION N/A 09/13/2020  ? Procedure: CARDIOVERSION;  Surgeon: Werner Lean, MD;  Location: [facility];  Service: Cardiovascular;  Laterality: N/A;  ? CARDIOVERSION N/A 09/19/2020  ? Procedure: CARDIOVERSION;  Surgeon: [name], MD;  Location: [facility];  Service: Cardiovascular;  Laterality: N/A;  ? EP IMPLANTABLE DEVICE N/A 12/19/2016  ? Procedure: ICD Generator Changeout;  Surgeon: [name], MD;  Location: [facility];  Service: Cardiovascular;   Laterality: N/A;  ? RIGHT/LEFT HEART CATH AND CORONARY ANGIOGRAPHY N/A 09/24/2020  ? Procedure: RIGHT/LEFT HEART CATH AND CORONARY ANGIOGRAPHY;  Surgeon: Lorretta Harp, MD;  Location: [facility];  Service:

## 2022-04-15 ENCOUNTER — Encounter (HOSPITAL_COMMUNITY)
Admission: EM | Disposition: A | Discharge status: Home / Self Care | Payer: Self-pay | Source: Home / Self Care | Attending: Internal Medicine

## 2022-04-15 ENCOUNTER — Ambulatory Visit (HOSPITAL_COMMUNITY)
Admission: RE | Admit: 2022-04-15 | Payer: Managed Care, Other (non HMO) | Source: Home / Self Care | Admitting: Interventional Cardiology

## 2022-04-15 DIAGNOSIS — D649 Anemia, unspecified: Secondary | ICD-10-CM | POA: Diagnosis not present

## 2022-04-15 DIAGNOSIS — D509 Iron deficiency anemia, unspecified: Secondary | ICD-10-CM

## 2022-04-15 DIAGNOSIS — R195 Other fecal abnormalities: Secondary | ICD-10-CM

## 2022-04-15 DIAGNOSIS — Z7901 Long term (current) use of anticoagulants: Secondary | ICD-10-CM | POA: Diagnosis not present

## 2022-04-15 LAB — GLUCOSE, CAPILLARY
Glucose-Capillary: 118 mg/dL — ABNORMAL HIGH (ref 70–99)
Glucose-Capillary: 110 mg/dL — ABNORMAL HIGH (ref 70–99)
Glucose-Capillary: 107 mg/dL — ABNORMAL HIGH (ref 70–99)
Glucose-Capillary: 115 mg/dL — ABNORMAL HIGH (ref 70–99)

## 2022-04-15 LAB — BASIC METABOLIC PANEL
Anion gap: 6 (ref 5–15)
BUN: 11 mg/dL (ref 8–23)
CO2: 23 mmol/L (ref 22–32)
Calcium: 7.8 mg/dL — ABNORMAL LOW (ref 8.9–10.3)
Chloride: 106 mmol/L (ref 98–111)
Creatinine, Ser: 0.84 mg/dL (ref 0.61–1.24)
GFR, Estimated: >60 mL/min (ref >60)
Glucose, Bld: 113 mg/dL — ABNORMAL HIGH (ref 70–99)
Potassium: 4.1 mmol/L (ref 3.5–5.1)
Sodium: 135 mmol/L (ref 135–145)

## 2022-04-15 LAB — CBC
HCT: 26.2 % — ABNORMAL LOW (ref 39.0–52.0)
Hemoglobin: 7.6 g/dL — ABNORMAL LOW (ref 13.0–17.0)
MCH: 22.9 pg — ABNORMAL LOW (ref 26.0–34.0)
MCHC: 29 g/dL — ABNORMAL LOW (ref 30.0–36.0)
MCV: 78.9 fL — ABNORMAL LOW (ref 80.0–100.0)
Platelets: 256 10*3/uL (ref 150–400)
RBC: 3.32 MIL/uL — ABNORMAL LOW (ref 4.22–5.81)
RDW: 20.5 % — ABNORMAL HIGH (ref 11.5–15.5)
WBC: 7.4 10*3/uL (ref 4.0–10.5)
nRBC: 0 % (ref 0.0–0.2)

## 2022-04-15 SURGERY — LEFT HEART CATH AND CORONARY ANGIOGRAPHY
Anesthesia: LOCAL

## 2022-04-15 MED ORDER — PEG-KCL-NACL-NASULF-NA ASC-C 100 G PO SOLR: 1.0000 | Freq: Once | ORAL | Status: DC

## 2022-04-15 MED ORDER — METOCLOPRAMIDE HCL 5 MG/ML IJ SOLN
10.0000 mg | Freq: Four times a day (QID) | INTRAMUSCULAR | Status: AC
  Administered 2022-04-15 – 2022-04-16 (×2): 10 mg via INTRAVENOUS
  Filled 2022-04-15 (×2): qty 2

## 2022-04-15 MED ORDER — BISACODYL 5 MG PO TBEC
20.0000 mg | DELAYED_RELEASE_TABLET | Freq: Once | ORAL | Status: AC
  Administered 2022-04-15: 20 mg via ORAL
  Filled 2022-04-15: qty 4

## 2022-04-15 MED ORDER — PEG-KCL-NACL-NASULF-NA ASC-C 100 G PO SOLR
0.5000 | Freq: Once | ORAL | Status: AC
  Administered 2022-04-16: 100 g via ORAL

## 2022-04-15 MED ORDER — PANTOPRAZOLE SODIUM 40 MG PO TBEC
40.0000 mg | DELAYED_RELEASE_TABLET | Freq: Two times a day (BID) | ORAL | Status: DC
  Administered 2022-04-15 – 2022-04-25 (×19): 40 mg via ORAL
  Filled 2022-04-15 (×19): qty 1

## 2022-04-15 MED ORDER — PEG-KCL-NACL-NASULF-NA ASC-C 100 G PO SOLR
0.5000 | Freq: Once | ORAL | Status: AC
  Administered 2022-04-15: 100 g via ORAL
  Filled 2022-04-15: qty 1

## 2022-04-15 NOTE — Progress Notes (Signed)
Pt stated that he will place CPAP on himself when ready to sleep.  ?

## 2022-04-15 NOTE — Progress Notes (Signed)
Pt will put himself on CPAP when ready for bed. ?

## 2022-04-15 NOTE — H&P (View-Only) (Signed)
? ?       Daily Rounding Note ? ?04/15/2022, 9:43 AM ? LOS: 3 days  ? ?SUBJECTIVE:   ?Chief complaint: Symptomatic anemia.  S/p 4 PRBC.    ? ?BPs and HR stable.  Cards cleared pt for egd/colon.   ?Patient passing brown stools.  No complaints.  No dizziness.  No shortness of breath with walking around in the room. ? ?OBJECTIVE:        ? Vital signs in last 24 hours:    ?Temp:  [98 ?F (36.7 ?C)-98.8 ?F (37.1 ?C)] 98.3 ?F (36.8 ?C) (05/02 0544) ?Pulse Rate:  [64-72] 72 (05/02 0544) ?Resp:  [18] 18 (05/02 0800) ?BP: (103-123)/(67-77) 120/77 (05/02 0544) ?SpO2:  [97 %-99 %] 97 % (05/02 0544) ?Last BM Date : 04/14/22 ?Filed Weights  ? 04/12/22 0350  ?Weight: 95.8 kg  ? ?General: Looks moderately chronically ill but no acute distress.  Comfortable.  Alert. ?Heart: RRR. ?Chest: Diminished breath sounds but clear.  No labored breathing or cough. ?Abdomen: Soft without tenderness.  Active bowel sounds. ?Extremities: No CCE. ?Neuro/Psych: Pleasant, calm, cooperative. ? ?Intake/Output from previous day: ?05/01 0701 - 05/02 0700 ?In: 840 [P.O.:840] ?Out: 1025 [Urine:1025] ? ?Intake/Output this shift: ?No intake/output data recorded. ? ?Lab Results: ?Recent Labs  ?  04/13/22 ?0332 04/14/22 ?0358 04/15/22 ?3846  ?WBC 8.0 6.6 7.4  ?HGB 7.4* 7.2* 7.6*  ?HCT 25.3* 25.1* 26.2*  ?PLT 257 232 256  ? ?BMET ?Recent Labs  ?  04/13/22 ?0332 04/14/22 ?0358 04/15/22 ?6599  ?NA 137 136 135  ?K 4.2 4.1 4.1  ?CL 111 108 106  ?CO2 '23 23 23  '$ ?GLUCOSE 110* 116* 113*  ?BUN '12 14 11  '$ ?CREATININE 0.77 0.90 0.84  ?CALCIUM 8.0* 7.9* 7.8*  ? ?LFT ?No results for input(s): PROT, ALBUMIN, AST, ALT, ALKPHOS, BILITOT, BILIDIR, IBILI in the last 72 hours. ?PT/INR ?No results for input(s): LABPROT, INR in the last 72 hours. ?Hepatitis Panel ?No results for input(s): HEPBSAG, HCVAB, HEPAIGM, HEPBIGM in the last 72 hours. ? ?Studies/Results: ?No results found. ? ?ASSESMENT:  ? ?  Symptomatic acute anemia.  Hgb 4  ... 7.6 after 4 PRBCs.   ?  ?  CAD.  S/p cardiac stenting. Hx Afib (currently SR).  Plavix and Eliquis on hold, last doses 4/28.  5 day plavix washout completes 5/3.  Cardiac cath set for 5/2 was canceled due to anemia.  Dr. Ellyn Hack has reviewed the patient's case and notes "   low risk procedure despite his cardiac disease".  "ultimately needs heart cath pending GI scope result".   Patient was not having angina despite the severe anemia. ?  ?  CAD.  S/p cardiac stenting. Hx Afib (currently SR).  Plavix and Eliquis on hold, last doses 4/28.  5 day plavix washout completes 5/3.  Cardiac cath set for 5/2 was canceled due to anemia. ?  ?  Ischemic CM.  ICD in situ.   ?  ?OSA, on CPAP. ?  ?  Ischemic CM.  ICD in situ.   ?  ?OSA, on CPAP.  ? ? ?PLAN  ? ?  Plan for EGD and colonoscopy tmrw a 3570.  Orders placed.  Patient agreeable ? ? ? ?Azucena Freed  04/15/2022, 9:43 AM ?Phone [phone]  ? ? ? ?ATTENDING ATTESTATION ?I personally saw the patient and performed a substantive portion of this encounter (>50% time spent), including a complete performance of at least one of the key components (MDM, Hx and/or Exam),  in conjunction with Azucena Freed. ? ?61 y/o male with significant cardiac history, longstanding IDA on iron which has not previously been evaluated endoscopically, who presenting with symptomatic anemia, Hgb 4s, heme positive stool in the setting of Eliquis and Plavix - last dose 4/28. EGD and colonoscopy recommended to further evaluate. [facility] cardiology consultation, they feel he is stable and safe to undergo anesthesia for this tomorrow. Plavix washes out on Wed 5/3. Plan on clear liquids today, bowel prep tonight, procedure tomorrow. Tentatively scheduled for tomorrow afternoon, time may vary depending on other emergencies occur tomorrow. Cardiology has recommended keeping Hgb > 8, will give him another unit PRBC.  ? ?I have discussed risks / benefits of EGD / colonoscopy and anesthesia with him, he  wishes to proceed. ? ?Jolly Mango, MD ?[facility] ? ?

## 2022-04-15 NOTE — Progress Notes (Signed)
?PROGRESS NOTE ? ? ? ?Gregory [name]  KGY:[id number] DOB: October 31, 1961 DOA: 04/11/2022 ?PCP: Patient, [name] ? ? ?Brief Narrative:  ?Gregory [name] is a 61 y.o. male with history of CAD status post stenting, chronic systolic HF, recurrent VTE, atrial fibrillation, diabetes mellitus type 2 had blood work done by his cardiologist and was found to hemoglobin around 4 was advised to come to the ER.  Patient states over the last few weeks he has been feeling increasingly weak and fatigued and short of breath on exertion.  Denies chest pain nausea vomiting diarrhea. Patient states he takes iron pills and has always had dark bowel movements.  Never had a colonoscopy or EGD. ? ?Assessment & Plan: ?  ?Principal Problem: ?  Acute GI bleeding ?Active Problems: ?  Ischemic cardiomyopathy ?  CAD S/P percutaneous coronary angioplasty ?  Essential hypertension ?  OSA on CPAP ?  Seizure disorder ([place]) ?  Symptomatic anemia ?  Heme positive stool ?  Anticoagulated ? ? ?Profound acute symptomatic anemia with concurrent GI bleed  ?-Hemoglobin 4.0 at intake, blood transfusion, hemoglobin continues to improve without further transfusion ?-Hemoglobin 7.6, cardiology and GI recommending hemoglobin around 8 for procedures, in attempts to avoid volume overload will follow a.m. labs, if hemoglobin continues to trend upwards likely no indication for repeat transfusion ?-Continue to hold Eliquis and Plavix ?-[facility] to follow, will need washout for anticoagulation and antiplatelets ?-Advance diet and procedure per GI recommendations ?*Currently awaiting cardiac clearance prior to endoscopy per discussion with GI ? ?History of chronic systolic CHF appears compensated. ?-Holding Lasix for now.  Appears euvolemic, avoid IV fluids or increased volume if possible (see above) ?-Continue Entresto.  Status post ICD. ?-Cardiology following ? ?History of recurrent VT on amiodarone.  ?-Cardiology consulted for further  discussion given outpatient cath initially planned for 04/15/22 -cleared for endoscopy with GI given low risk procedure ?-Defer to cardiology whether patient needs further inpatient cardiac cath versus rescheduling of previous outpatient cath ? ?History of A-fib in sinus rhythm on metoprolol and amiodarone ?- Eliquis on hold secondary to above. ? ?Diet controlled diabetes mellitus type 2  ?Continue sliding scale insulin/ ? ?Seizure disorder on Keppra. ?Sleep apnea on CPAP. ?CAD status post tenting denies any chest pain.  Holding Plavix due to GI bleed. Outpatient diagnostic cath planned next week (04/15/22). ?  ?DVT prophylaxis: SCDs. ?Code Status: Full code. ?Family Communication: Discussed with patient. ? ?Status is: Inpatient ? ?Dispo: The patient is from: Home ?             Anticipated d/c is to: Home ?             Anticipated d/c date is: 24-48 hours ?             Patient currently not medically stable for discharge ? ?Consultants:  ?GI, cardiology ? ?Procedures:  ?Pending endoscopy possible cardiac cath as above ? ?Antimicrobials:  ?None ? ?Subjective: ?No acute issues or events overnight, denies nausea vomiting diarrhea constipation headache fevers chills or chest pain ? ?Objective: ?Vitals:  ? 04/14/22 1126 04/14/22 2046 04/15/22 0440 04/15/22 0544  ?BP: 103/67 123/76 112/75 120/77  ?Pulse: 64 64 64 72  ?Resp: '18 18 18 18  '$ ?Temp: 98 ?F (36.7 ?C) 98.7 ?F (37.1 ?C) 98.8 ?F (37.1 ?C) 98.3 ?F (36.8 ?C)  ?TempSrc: Oral Oral Oral Oral  ?SpO2:  99% 98% 97%  ?Weight:      ?Height:      ? ? ?  Intake/Output Summary (Last 24 hours) at 04/15/2022 0802 ?Last data filed at 04/15/2022 0440 ?Gross per 24 hour  ?Intake 840 ml  ?Output 1025 ml  ?Net -185 ml  ? ? ?Filed Weights  ? 04/12/22 0350  ?Weight: 95.8 kg  ? ? ?Examination: ? ?General:  Pleasantly resting in bed, No acute distress. ?Lungs:  Clear to auscultate bilaterally without rhonchi, wheeze, or rales. ?Heart:  Regular rate and rhythm.  Without murmurs, rubs, or  gallops. ?Abdomen:  Soft, nontender, nondistended.  Without guarding or rebound. ?Extremities: Without cyanosis, clubbing, edema, or obvious deformity. ?Vascular:  Dorsalis pedis and posterior tibial pulses palpable bilaterally. ?Skin:  Warm and dry, no erythema, no ulcerations. ? ?Data Reviewed: I have personally reviewed following labs and imaging studies ? ?CBC: ?Recent Labs  ?Lab 04/11/22 ?1219 04/11/22 ?2107 04/11/22 ?2107 04/12/22 ?1356 04/12/22 ?2119 04/13/22 ?7371 04/14/22 ?0626 04/15/22 ?9485  ?WBC 9.0 6.9  --   --   --  8.0 6.6 7.4  ?NEUTROABS 6.6 4.7  --   --   --   --   --   --   ?HGB 4.1* 4.0*   < > 6.6* 7.4* 7.4* 7.2* 7.6*  ?HCT 16.0* 16.7*   < > 22.0* 25.5* 25.3* 25.1* 26.2*  ?MCV 71* 76.6*  --   --   --  77.4* 78.7* 78.9*  ?PLT 353 351  --   --   --  257 232 256  ? < > = values in this interval not displayed.  ? ? ?Basic Metabolic Panel: ?Recent Labs  ?Lab 04/11/22 ?1219 04/11/22 ?2107 04/13/22 ?4627 04/14/22 ?0350 04/15/22 ?0938  ?NA 137 137 137 136 135  ?K 5.1 4.6 4.2 4.1 4.1  ?CL 105 110 111 108 106  ?CO2 '21 23 23 23 23  '$ ?GLUCOSE 108* 115* 110* 116* 113*  ?BUN '13 14 12 14 11  '$ ?CREATININE 0.91 1.11 0.77 0.90 0.84  ?CALCIUM 8.1* 8.1* 8.0* 7.9* 7.8*  ? ? ?GFR: ?Estimated Creatinine Clearance: 116.4 mL/min (by C-G formula based on SCr of 0.84 mg/dL). ?Liver Function Tests: ?Recent Labs  ?Lab 04/11/22 ?2107  ?AST 13*  ?ALT 12  ?ALKPHOS 60  ?BILITOT 0.1*  ?PROT 6.2*  ?ALBUMIN 3.1*  ? ? ?No results for input(s): LIPASE, AMYLASE in the last 168 hours. ?No results for input(s): AMMONIA in the last 168 hours. ?Coagulation Profile: ?No results for input(s): INR, PROTIME in the last 168 hours. ?Cardiac Enzymes: ?No results for input(s): CKTOTAL, CKMB, CKMBINDEX, TROPONINI in the last 168 hours. ?BNP (last 3 results) ?No results for input(s): PROBNP in the last 8760 hours. ?HbA1C: ?No results for input(s): HGBA1C in the last 72 hours. ?CBG: ?Recent Labs  ?Lab 04/14/22 ?0802 04/14/22 ?1128 04/14/22 ?1700  04/14/22 ?2113 04/15/22 ?0744  ?GLUCAP 122* 138* 105* 115* 118*  ? ? ?Lipid Profile: ?No results for input(s): CHOL, HDL, LDLCALC, TRIG, CHOLHDL, LDLDIRECT in the last 72 hours. ?Thyroid Function Tests: ?No results for input(s): TSH, T4TOTAL, FREET4, T3FREE, THYROIDAB in the last 72 hours. ?Anemia Panel: ?No results for input(s): VITAMINB12, FOLATE, FERRITIN, TIBC, IRON, RETICCTPCT in the last 72 hours. ?Sepsis Labs: ?No results for input(s): PROCALCITON, LATICACIDVEN in the last 168 hours. ? ?No results found for this or any previous visit (from the past 240 hour(s)).  ? ? ? ? ? ?Radiology Studies: ?No results found. ? ? ? ? ? ?Scheduled Meds: ? sodium chloride   Intravenous Once  ? amiodarone  200 mg Oral Daily  ? atorvastatin  80  mg Oral QPM  ? dapagliflozin propanediol  10 mg Oral Daily  ? ezetimibe  10 mg Oral Daily  ? insulin aspart  0-6 Units Subcutaneous TID WC  ? levETIRAcetam  500 mg Oral BID  ? metoprolol succinate  25 mg Oral BID  ? pantoprazole (PROTONIX) IV  40 mg Intravenous Q12H  ? sacubitril-valsartan  1 tablet Oral BID  ? ?Continuous Infusions: ? ? LOS: 3 days  ? ?Time spent: 9mn ? ?WLittle Ishikawa DO ?[facility] ? ?If 7PM-7AM, please contact night-coverage ?[email or web address] ? ?04/15/2022, 8:02 AM   ? ? ? ?

## 2022-04-15 NOTE — Progress Notes (Addendum)
? ?       Daily Rounding Note ? ?04/15/2022, 9:43 AM ? LOS: 3 days  ? ?SUBJECTIVE:   ?Chief complaint: Symptomatic anemia.  S/p 4 PRBC.    ? ?BPs and HR stable.  Cards cleared pt for egd/colon.   ?Patient passing brown stools.  No complaints.  No dizziness.  No shortness of breath with walking around in the room. ? ?OBJECTIVE:        ? Vital signs in last 24 hours:    ?Temp:  [98 ?F (36.7 ?C)-98.8 ?F (37.1 ?C)] 98.3 ?F (36.8 ?C) (05/02 0544) ?Pulse Rate:  [64-72] 72 (05/02 0544) ?Resp:  [18] 18 (05/02 0800) ?BP: (103-123)/(67-77) 120/77 (05/02 0544) ?SpO2:  [97 %-99 %] 97 % (05/02 0544) ?Last BM Date : 04/14/22 ?Filed Weights  ? 04/12/22 0350  ?Weight: 95.8 kg  ? ?General: Looks moderately chronically ill but no acute distress.  Comfortable.  Alert. ?Heart: RRR. ?Chest: Diminished breath sounds but clear.  No labored breathing or cough. ?Abdomen: Soft without tenderness.  Active bowel sounds. ?Extremities: No CCE. ?Neuro/Psych: Pleasant, calm, cooperative. ? ?Intake/Output from previous day: ?05/01 0701 - 05/02 0700 ?In: 840 [P.O.:840] ?Out: 1025 [Urine:1025] ? ?Intake/Output this shift: ?No intake/output data recorded. ? ?Lab Results: ?Recent Labs  ?  04/13/22 ?0332 04/14/22 ?0358 04/15/22 ?6389  ?WBC 8.0 6.6 7.4  ?HGB 7.4* 7.2* 7.6*  ?HCT 25.3* 25.1* 26.2*  ?PLT 257 232 256  ? ?BMET ?Recent Labs  ?  04/13/22 ?0332 04/14/22 ?0358 04/15/22 ?3734  ?NA 137 136 135  ?K 4.2 4.1 4.1  ?CL 111 108 106  ?CO2 '23 23 23  '$ ?GLUCOSE 110* 116* 113*  ?BUN '12 14 11  '$ ?CREATININE 0.77 0.90 0.84  ?CALCIUM 8.0* 7.9* 7.8*  ? ?LFT ?No results for input(s): PROT, ALBUMIN, AST, ALT, ALKPHOS, BILITOT, BILIDIR, IBILI in the last 72 hours. ?PT/INR ?No results for input(s): LABPROT, INR in the last 72 hours. ?Hepatitis Panel ?No results for input(s): HEPBSAG, HCVAB, HEPAIGM, HEPBIGM in the last 72 hours. ? ?Studies/Results: ?No results found. ? ?ASSESMENT:  ? ?  Symptomatic acute anemia.  Hgb 4  ... 7.6 after 4 PRBCs.   ?  ?  CAD.  S/p cardiac stenting. Hx Afib (currently SR).  Plavix and Eliquis on hold, last doses 4/28.  5 day plavix washout completes 5/3.  Cardiac cath set for 5/2 was canceled due to anemia.  Dr. Ellyn Hack has reviewed the patient's case and notes "   low risk procedure despite his cardiac disease".  "ultimately needs heart cath pending GI scope result".   Patient was not having angina despite the severe anemia. ?  ?  CAD.  S/p cardiac stenting. Hx Afib (currently SR).  Plavix and Eliquis on hold, last doses 4/28.  5 day plavix washout completes 5/3.  Cardiac cath set for 5/2 was canceled due to anemia. ?  ?  Ischemic CM.  ICD in situ.   ?  ?OSA, on CPAP. ?  ?  Ischemic CM.  ICD in situ.   ?  ?OSA, on CPAP.  ? ? ?PLAN  ? ?  Plan for EGD and colonoscopy tmrw a 2876.  Orders placed.  Patient agreeable ? ? ? ?Azucena Freed  04/15/2022, 9:43 AM ?Phone [phone]  ? ? ? ?ATTENDING ATTESTATION ?I personally saw the patient and performed a substantive portion of this encounter (>50% time spent), including a complete performance of at least one of the key components (MDM, Hx and/or Exam),  in conjunction with Azucena Freed. ? ?61 y/o male with significant cardiac history, longstanding IDA on iron which has not previously been evaluated endoscopically, who presenting with symptomatic anemia, Hgb 4s, heme positive stool in the setting of Eliquis and Plavix - last dose 4/28. EGD and colonoscopy recommended to further evaluate. [facility] cardiology consultation, they feel he is stable and safe to undergo anesthesia for this tomorrow. Plavix washes out on Wed 5/3. Plan on clear liquids today, bowel prep tonight, procedure tomorrow. Tentatively scheduled for tomorrow afternoon, time may vary depending on other emergencies occur tomorrow. Cardiology has recommended keeping Hgb > 8, will give him another unit PRBC.  ? ?I have discussed risks / benefits of EGD / colonoscopy and anesthesia with him, he  wishes to proceed. ? ?Jolly Mango, MD ?[facility] ? ?

## 2022-04-16 ENCOUNTER — Encounter (HOSPITAL_COMMUNITY)
Admission: EM | Disposition: A | Discharge status: Home / Self Care | Payer: Self-pay | Source: Home / Self Care | Attending: Internal Medicine

## 2022-04-16 ENCOUNTER — Inpatient Hospital Stay (HOSPITAL_COMMUNITY): Payer: Commercial Managed Care - HMO | Admitting: Anesthesiology

## 2022-04-16 ENCOUNTER — Inpatient Hospital Stay (HOSPITAL_COMMUNITY): Payer: Commercial Managed Care - HMO

## 2022-04-16 DIAGNOSIS — R195 Other fecal abnormalities: Secondary | ICD-10-CM

## 2022-04-16 DIAGNOSIS — K648 Other hemorrhoids: Secondary | ICD-10-CM | POA: Diagnosis not present

## 2022-04-16 DIAGNOSIS — K635 Polyp of colon: Secondary | ICD-10-CM

## 2022-04-16 DIAGNOSIS — D649 Anemia, unspecified: Secondary | ICD-10-CM | POA: Diagnosis not present

## 2022-04-16 DIAGNOSIS — I472 Ventricular tachycardia, unspecified: Secondary | ICD-10-CM | POA: Diagnosis not present

## 2022-04-16 DIAGNOSIS — K449 Diaphragmatic hernia without obstruction or gangrene: Secondary | ICD-10-CM

## 2022-04-16 DIAGNOSIS — C182 Malignant neoplasm of ascending colon: Principal | ICD-10-CM

## 2022-04-16 DIAGNOSIS — D126 Benign neoplasm of colon, unspecified: Secondary | ICD-10-CM

## 2022-04-16 DIAGNOSIS — C180 Malignant neoplasm of cecum: Secondary | ICD-10-CM

## 2022-04-16 DIAGNOSIS — D490 Neoplasm of unspecified behavior of digestive system: Secondary | ICD-10-CM

## 2022-04-16 DIAGNOSIS — D509 Iron deficiency anemia, unspecified: Secondary | ICD-10-CM | POA: Diagnosis not present

## 2022-04-16 DIAGNOSIS — K6389 Other specified diseases of intestine: Secondary | ICD-10-CM

## 2022-04-16 HISTORY — PX: SUBMUCOSAL TATTOO INJECTION: SHX6856

## 2022-04-16 HISTORY — PX: ESOPHAGOGASTRODUODENOSCOPY: SHX5428

## 2022-04-16 HISTORY — PX: BIOPSY: SHX5522

## 2022-04-16 HISTORY — PX: POLYPECTOMY: SHX5525

## 2022-04-16 HISTORY — PX: COLONOSCOPY WITH PROPOFOL: SHX5780

## 2022-04-16 LAB — CBC
HCT: 28.2 % — ABNORMAL LOW (ref 39.0–52.0)
Hemoglobin: 8 g/dL — ABNORMAL LOW (ref 13.0–17.0)
MCH: 22.9 pg — ABNORMAL LOW (ref 26.0–34.0)
MCHC: 28.4 g/dL — ABNORMAL LOW (ref 30.0–36.0)
MCV: 80.8 fL (ref 80.0–100.0)
Platelets: 210 10*3/uL (ref 150–400)
RBC: 3.49 MIL/uL — ABNORMAL LOW (ref 4.22–5.81)
RDW: 21.2 % — ABNORMAL HIGH (ref 11.5–15.5)
WBC: 5.3 10*3/uL (ref 4.0–10.5)
nRBC: 0 % (ref 0.0–0.2)

## 2022-04-16 LAB — MAGNESIUM: Magnesium: 2 mg/dL (ref 1.7–2.4)

## 2022-04-16 LAB — BASIC METABOLIC PANEL
Anion gap: 7 (ref 5–15)
BUN: 12 mg/dL (ref 8–23)
CO2: 20 mmol/L — ABNORMAL LOW (ref 22–32)
Calcium: 8 mg/dL — ABNORMAL LOW (ref 8.9–10.3)
Chloride: 107 mmol/L (ref 98–111)
Creatinine, Ser: 0.93 mg/dL (ref 0.61–1.24)
GFR, Estimated: >60 mL/min (ref >60)
Glucose, Bld: 104 mg/dL — ABNORMAL HIGH (ref 70–99)
Potassium: 3.5 mmol/L (ref 3.5–5.1)
Sodium: 134 mmol/L — ABNORMAL LOW (ref 135–145)

## 2022-04-16 LAB — GLUCOSE, CAPILLARY
Glucose-Capillary: 99 mg/dL (ref 70–99)
Glucose-Capillary: 143 mg/dL — ABNORMAL HIGH (ref 70–99)
Glucose-Capillary: 98 mg/dL (ref 70–99)

## 2022-04-16 SURGERY — COLONOSCOPY WITH PROPOFOL
Anesthesia: Monitor Anesthesia Care

## 2022-04-16 MED ORDER — PROPOFOL 10 MG/ML IV BOLUS
INTRAVENOUS | Status: DC | PRN
  Administered 2022-04-16: 10 mg via INTRAVENOUS

## 2022-04-16 MED ORDER — ETOMIDATE 2 MG/ML IV SOLN
INTRAVENOUS | Status: DC | PRN
  Administered 2022-04-16 (×3): 2 mg via INTRAVENOUS
  Administered 2022-04-16: 4 mg via INTRAVENOUS
  Administered 2022-04-16 (×3): 2 mg via INTRAVENOUS

## 2022-04-16 MED ORDER — IOHEXOL 300 MG/ML  SOLN
100.0000 mL | Freq: Once | INTRAMUSCULAR | Status: AC | PRN
Start: 2022-04-16 — End: 2022-04-16
  Administered 2022-04-16: 100 mL via INTRAVENOUS

## 2022-04-16 MED ORDER — PHENYLEPHRINE HCL-NACL 20-0.9 MG/250ML-% IV SOLN
INTRAVENOUS | Status: DC | PRN
  Administered 2022-04-16: 40 ug/min via INTRAVENOUS

## 2022-04-16 MED ORDER — PROPOFOL 500 MG/50ML IV EMUL
INTRAVENOUS | Status: DC | PRN
Start: 2022-04-16 — End: 2022-04-16
  Administered 2022-04-16: 200 ug/kg/min via INTRAVENOUS

## 2022-04-16 MED ORDER — AMIODARONE HCL IN DEXTROSE 360-4.14 MG/200ML-% IV SOLN
30.0000 mg/h | INTRAVENOUS | Status: DC
  Administered 2022-04-16 – 2022-04-17 (×2): 30 mg/h via INTRAVENOUS
  Filled 2022-04-16 (×2): qty 200

## 2022-04-16 MED ORDER — AMIODARONE HCL 200 MG PO TABS
400.0000 mg | ORAL_TABLET | Freq: Every day | ORAL | Status: DC
  Administered 2022-04-17 – 2022-04-25 (×8): 400 mg via ORAL
  Filled 2022-04-16 (×9): qty 2

## 2022-04-16 MED ORDER — LIDOCAINE 2% (20 MG/ML) 5 ML SYRINGE
INTRAMUSCULAR | Status: DC | PRN
  Administered 2022-04-16: 40 mg via INTRAVENOUS

## 2022-04-16 MED ORDER — PHENYLEPHRINE 80 MCG/ML (10ML) SYRINGE FOR IV PUSH (FOR BLOOD PRESSURE SUPPORT)
PREFILLED_SYRINGE | INTRAVENOUS | Status: DC | PRN
  Administered 2022-04-16: 160 ug via INTRAVENOUS
  Administered 2022-04-16: 80 ug via INTRAVENOUS

## 2022-04-16 MED ORDER — AMIODARONE LOAD VIA INFUSION
150.0000 mg | Freq: Once | INTRAVENOUS | Status: AC
  Administered 2022-04-16: 150 mg via INTRAVENOUS
  Filled 2022-04-16: qty 83.34

## 2022-04-16 MED ORDER — PHENYLEPHRINE HCL-NACL 20-0.9 MG/250ML-% IV SOLN: INTRAVENOUS | Status: DC | PRN

## 2022-04-16 MED ORDER — IOHEXOL 9 MG/ML PO SOLN
ORAL | Status: AC
Start: 2022-04-16 — End: 2022-04-16
  Administered 2022-04-16: 500 mL
  Filled 2022-04-16: qty 500

## 2022-04-16 MED ORDER — IOHEXOL 9 MG/ML PO SOLN
ORAL | Status: AC
  Administered 2022-04-16: 500 mL
  Filled 2022-04-16: qty 1000

## 2022-04-16 MED ORDER — SODIUM CHLORIDE 0.9 % IV SOLN
INTRAVENOUS | Status: AC | PRN
  Administered 2022-04-16: 500 mL via INTRAVENOUS

## 2022-04-16 MED ORDER — AMIODARONE HCL IN DEXTROSE 360-4.14 MG/200ML-% IV SOLN
60.0000 mg/h | INTRAVENOUS | Status: AC
  Administered 2022-04-16 (×2): 60 mg/h via INTRAVENOUS
  Filled 2022-04-16 (×2): qty 200

## 2022-04-16 MED ORDER — POTASSIUM CHLORIDE 10 MEQ/100ML IV SOLN
10.0000 meq | INTRAVENOUS | Status: AC
  Administered 2022-04-16 (×2): 10 meq via INTRAVENOUS
  Filled 2022-04-16 (×2): qty 100

## 2022-04-16 SURGICAL SUPPLY — 22 items

## 2022-04-16 NOTE — Assessment & Plan Note (Addendum)
Longstanding history of iron deficiency anemia. ?No prior endoscopies to evaluate ?Presented with hemoglobin in the 4 g range, FOBT + in the setting of Eliquis and Plavix. ?No history of overt GI bleed.  Stool chronically dark but on iron supplements. ?EGD and colonoscopy done.  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 ?S/p Feraheme x1 dose on 5/5. ?S/p 7 units PRBC transfusion this admission.  Hemoglobin stable and 10 g range postop. ?

## 2022-04-16 NOTE — Op Note (Addendum)
[facility] ?Patient Name: Gregory [name] ?Procedure Date : 04/16/2022 ?MRN: [id number] ?Attending MD: Carlota Raspberry. Havery Moros , MD ?Date of Birth: 1961/08/07 ?CSN: [id number] ?Age: 61 ?Admit Type: Inpatient ?Procedure:                Upper GI endoscopy ?Indications:              Iron deficiency anemia, heme positive stool on  ?                          Eliquis / Plavix ?Providers:                Carlota Raspberry. Havery Moros, MD, Allayne Gitelman, RN, Alphonzo Grieve  ?                          Leighton Roach, Technician ?Referring MD:              ?Medicines:                Monitored Anesthesia Care ?Complications:            No immediate complications. Estimated blood loss:  ?                          Minimal. ?Estimated Blood Loss:     Estimated blood loss was minimal. ?Procedure:                Pre-Anesthesia Assessment: ?                          - Prior to the procedure, a History and Physical  ?                          was performed, and patient medications and  ?                          allergies were reviewed. The patient's tolerance of  ?                          previous anesthesia was also reviewed. The risks  ?                          and benefits of the procedure and the sedation  ?                          options and risks were discussed with the patient.  ?                          All questions were answered, and informed consent  ?                          was obtained. Prior Anticoagulants: The patient has  ?                          taken Plavix (clopidogrel), last dose was 5 days  ?  prior to procedure, last dose Eliquis 5 days ago.  ?                          ASA Grade Assessment: III - A patient with severe  ?                          systemic disease. After reviewing the risks and  ?                          benefits, the patient was deemed in satisfactory  ?                          condition to undergo the procedure. ?                          After obtaining informed consent, the  endoscope was  ?                          passed under direct vision. Throughout the  ?                          procedure, the patient's blood pressure, pulse, and  ?                          oxygen saturations were monitored continuously. The  ?                          [id number]-[id number] ([id number] Olympus endoscope was introduced  ?                          through the mouth, and advanced to the second part  ?                          of duodenum. The patient tolerated the procedure  ?                          well. The upper GI endoscopy was accomplished  ?                          without difficulty. ?Scope In: ?Scope Out: ?Findings: ?     Esophagogastric landmarks were identified: the Z-line was found at 38  ?     cm, the gastroesophageal junction was found at 38 cm and the upper  ?     extent of the gastric folds was found at 40 cm from the incisors. ?     A 2 cm hiatal hernia was present. ?     The exam of the esophagus was otherwise normal. ?     The entire examined stomach was normal. ?     The duodenal bulb and second portion of the duodenum were normal.  ?     Duodenal sweep was tortous. Superficial mild mucosal trauma noted from  ?     the exam. ?Impression:               - Esophagogastric landmarks identified. ?                          -  2 cm hiatal hernia. ?                          - Normal stomach. ?                          - Normal duodenal bulb and second portion of the  ?                          duodenum. ?                          No cause for bleeding / anemia on EGD ?Recommendation:           - Proceed with colonoscopy ?                          - Recommendations per colonoscopy note ?Procedure Code(s):        --- Professional --- ?                          [phone], Esophagogastroduodenoscopy, flexible,  ?                          transoral; diagnostic, including collection of  ?                          specimen(s) by brushing or washing, when performed  ?                          (separate  procedure) ?Diagnosis Code(s):        --- Professional --- ?                          K44.9, Diaphragmatic hernia without obstruction or  ?                          gangrene ?                          D50.9, Iron deficiency anemia, unspecified ?CPT copyright 2019 [employer]. All rights reserved. ?The codes documented in this report are preliminary and upon coder review may  ?be revised to meet current compliance requirements. ?Carlota Raspberry. Javionna Leder, MD ?04/16/2022 3:35:30 PM ?This report has been signed electronically. ?Number of Addenda: 0 ?

## 2022-04-16 NOTE — Significant Event (Signed)
Rapid Response Event Note  ? ?Reason for Call :  ?Patient arrived from procedure with Phenylephrine gtt infusing ? ?Initial Focused Assessment:  ?Patient is alert and oriented.  He is easily conversant. ?BP 128/82  HR 63  RR 15  O2 sat 100% on RA ? ? ? ?Interventions:  ?Titrated Phenylephrine gtt off.  ?BP 128/90  HR 65  RR 16  O2 sat 95% on RA ? ?Plan of Care:  ?RN to call if patient becomes hypotensive ? ? ?Event Summary:  ? ?MD Notified:  ?Call Time: 1636   ?Arrival Time: 1640 ?End Time: Lanora Manis ? ?Raliegh Ip, RN ?

## 2022-04-16 NOTE — Progress Notes (Addendum)
Thickening ? ?Progress Note ? ?Patient Name: Gregory [name] ?Date of Encounter: 04/16/2022 ? ?[facility] Cardiologist: Sanda Klein, MD  ? ?Subjective  ? ?No complaints this morning. Sitting up comfortable in bed.  ? ?Inpatient Medications  ?  ?Scheduled Meds: ? sodium chloride   Intravenous Once  ? atorvastatin  80 mg Oral QPM  ? dapagliflozin propanediol  10 mg Oral Daily  ? ezetimibe  10 mg Oral Daily  ? insulin aspart  0-6 Units Subcutaneous TID WC  ? levETIRAcetam  500 mg Oral BID  ? metoprolol succinate  25 mg Oral BID  ? pantoprazole  40 mg Oral BID  ? sacubitril-valsartan  1 tablet Oral BID  ? ?Continuous Infusions: ? amiodarone 60 mg/hr (04/16/22 0800)  ? amiodarone    ? potassium chloride 10 mEq (04/16/22 0759)  ? ?PRN Meds: ?acetaminophen, diphenhydrAMINE, melatonin  ? ?Vital Signs  ?  ?Vitals:  ? 04/15/22 1106 04/15/22 1107 04/15/22 2045 04/16/22 0525  ?BP: 103/69  (!) 94/59 112/75  ?Pulse: 70 69 69 68  ?Resp: '19 16 17 19  '$ ?Temp: (!) 100.5 ?F (38.1 ?C)  99.6 ?F (37.6 ?C) 99.3 ?F (37.4 ?C)  ?TempSrc: Axillary  Oral Axillary  ?SpO2: 97% 97% 97% 99%  ?Weight:      ?Height:      ? ? ?Intake/Output Summary (Last 24 hours) at 04/16/2022 0807 ?Last data filed at 04/15/2022 1700 ?Gross per 24 hour  ?Intake --  ?Output 200 ml  ?Net -200 ml  ? ? ?  04/12/2022  ?  3:50 AM 03/21/2022  ?  3:41 PM 02/27/2022  ?  3:58 PM  ?Last 3 Weights  ?Weight (lbs) 211 lb 1.6 oz 219 lb 6.4 oz 222 lb 6.4 oz  ?Weight (kg) 95.754 kg 99.519 kg 100.88 kg  ?   ? ?Telemetry  ?  ?Sinus Rhythm, freq runs of NSVT starting around 5am this morning - Personally Reviewed ? ?ECG  ?  ?No new tracing this morning ? ?Physical Exam  ? ?GEN: No acute distress.   ?Neck: No JVD ?Cardiac: RRR, no murmurs, rubs, or gallops.  ?Respiratory: Clear to auscultation bilaterally. ?GI: Soft, nontender, non-distended  ?MS: No edema; No deformity. ?Neuro:  Nonfocal  ?Psych: Normal affect  ? ?Labs  ?  ?High Sensitivity Troponin:  No results for input(s):  TROPONINIHS in the last 720 hours.   ?Chemistry ?Recent Labs  ?Lab 04/11/22 ?2107 04/13/22 ?0332 04/14/22 ?0358 04/15/22 ?0762 04/16/22 ?2633  ?NA 137   < > 136 135 134*  ?K 4.6   < > 4.1 4.1 3.5  ?CL 110   < > 108 106 107  ?CO2 23   < > 23 23 20*  ?GLUCOSE 115*   < > 116* 113* 104*  ?BUN 14   < > '14 11 12  '$ ?CREATININE 1.11   < > 0.90 0.84 0.93  ?CALCIUM 8.1*   < > 7.9* 7.8* 8.0*  ?PROT 6.2*  --   --   --   --   ?ALBUMIN 3.1*  --   --   --   --   ?AST 13*  --   --   --   --   ?ALT 12  --   --   --   --   ?ALKPHOS 60  --   --   --   --   ?BILITOT 0.1*  --   --   --   --   ?GFRNONAA >60   < > >  60 >60 >60  ?ANIONGAP 4*   < > '5 6 7  '$ ? < > = values in this interval not displayed.  ?  ?Lipids No results for input(s): CHOL, TRIG, HDL, LABVLDL, LDLCALC, CHOLHDL in the last 168 hours.  ?Hematology ?Recent Labs  ?Lab 04/14/22 ?0358 04/15/22 ?0947 04/16/22 ?0962  ?WBC 6.6 7.4 5.3  ?RBC 3.19* 3.32* 3.49*  ?HGB 7.2* 7.6* 8.0*  ?HCT 25.1* 26.2* 28.2*  ?MCV 78.7* 78.9* 80.8  ?MCH 22.6* 22.9* 22.9*  ?MCHC 28.7* 29.0* 28.4*  ?RDW 19.6* 20.5* 21.2*  ?PLT 232 256 210  ? ?Thyroid No results for input(s): TSH, FREET4 in the last 168 hours.  ?BNP ?Recent Labs  ?Lab 04/11/22 ?2107  ?BNP 616.2*  ?  ?DDimer No results for input(s): DDIMER in the last 168 hours.  ? ?Radiology  ?  ?No results found. ? ?Cardiac Studies  ? ?Echo: 04/07/22 ? ?IMPRESSIONS  ? ? ? 1. 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  ? 2. LV function is depressed with hypokinesis of the lateral wall,  ?mid/distal inferior wall and apex; akinesis with aneurysmal dilitation of  ?inferior base. . Left ventricular ejection fraction, by estimation, is 35  ?to 40%. The left ventricle has  ?moderately decreased function. The left ventricular internal cavity size  ?was moderately dilated. There is mild left ventricular hypertrophy. Left  ?ventricular diastolic parameters are indeterminate.  ?  3. Right ventricular systolic function is normal. The right ventricular  ?size is normal. There is mildly elevated pulmonary artery systolic  ?pressure.  ? 4. Left atrial size was severely dilated.  ? 5. Right atrial size was mildly dilated.  ? 6. The mitral valve is normal in structure. Mild mitral valve  ?regurgitation.  ? 7. The aortic valve is tricuspid. Aortic valve regurgitation is not  ?visualized. Aortic valve sclerosis/calcification is present, without any  ?evidence of aortic stenosis.  ? 8. There is borderline dilatation of the aortic root, measuring 39 mm.  ?There is borderline dilatation of the ascending aorta, measuring 39 mm.  ? ?FINDINGS  ? Left Ventricle: LV function is depressed with hypokinesis of the lateral  ?wall, mid/distal inferior wall and apex; akinesis with aneurysmal  ?dilitation of inferior base. Left ventricular ejection fraction, by  ?estimation, is 35 to 40%. The left ventricle  ?has moderately decreased function. The left ventricular internal cavity  ?size was moderately dilated. There is mild left ventricular hypertrophy.  ?Left ventricular diastolic parameters are indeterminate.  ? ?Right Ventricle: The right ventricular size is normal. Right vetricular  ?wall thickness was not assessed. Right ventricular systolic function is  ?normal. There is mildly elevated pulmonary artery systolic pressure. The  ?tricuspid regurgitant velocity is  ?2.98 m/s, and with an assumed right atrial pressure of 8 mmHg, the  ?estimated right ventricular systolic pressure is 83.6 mmHg.  ? ?Left Atrium: Left atrial size was severely dilated.  ? ?Right Atrium: Right atrial size was mildly dilated.  ? ?Pericardium: There is no evidence of pericardial effusion.  ? ?Mitral Valve: The mitral valve is normal in structure. Mild mitral valve  ?regurgitation.  ? ?Tricuspid Valve: The tricuspid valve is normal in structure. Tricuspid  ?valve regurgitation is trivial.  ? ?Aortic Valve: The aortic valve is tricuspid.  Aortic valve regurgitation is  ?not visualized. Aortic valve sclerosis/calcification is present, without  ?any evidence of aortic stenosis.  ? ?Pulmonic Valve: The pulmonic valve  was normal in structure. Pulmonic valve  ?regurgitation is trivial.  ? ?Aorta: The aortic root and ascending aorta are structurally normal, with  ?no evidence of dilitation. There is borderline dilatation of the aortic  ?root, measuring 39 mm. There is borderline dilatation of the ascending  ?aorta, measuring 39 mm.  ? ?IAS/Shunts: No atrial level shunt detected by color flow Doppler.  ? ?Patient Profile  ?   ?61 y.o. male with a hx of ischemic cardiomyopathy,  chronic systolic and diastolic heart failure, recurrent sustained VT with various morphologies, s/p [employer] dual chamber ICD, A fib on Eliquis and amiodarone,  IDA, OSA on CPAP, HLD, erectile dysfunction, who is being seen 04/14/2022 for the evaluation of preprocedure cardiac evaluation at the request of Dr Havery Moros.  ? ?Assessment & Plan  ?  ?Severe symptomatic anemia: s/p 4 unit PRBC, Hgb improved from 4 to 8.0 today (stable over the past several days). Needs GI scope to identify bleeding source, planned for colonoscopy today ?-- RCRI 2 with 6.6% perioperative risk of major cardiac event, GI scope is a low risk procedure despite his cardiac disease, he is not actively having angina symptoms despite his severe anemia in admission. ?  ?CAD with hx of PCI to LAD: LHC from 09/24/20 with known total occlusion of the right coronary artery and previously placed stents were widely patent. Noted difficulty in radial approach and it was recommended that future coronary angio be performed from the femoral approach.   ?-- remains chest pain free had recent recurrent VT requiring ICD therapy as an outpatient of which lead to original plan for outpatient cardiac cath. This has been delayed with initial presentation of severe anemia.  ?-- Plavix (along with DOAC) held in the setting of anemia  2/2 GI bleed ?-- ultimately needs heart cath pending GI scope result  ?  ?Recurrent VT with multiple morphologies s/p ACID: followed by EP Dr Caryl Comes, referred to Dr Lovena Le for ablation, ischemic workup plan

## 2022-04-16 NOTE — Assessment & Plan Note (Addendum)
Cardiology team follow-up appreciated. ?Echo from 04/07/2022: LVEF 35-40%, worse compared to 09/22/2020 (LVEF 40-45%) ?Clinically euvolemic ?Continue Farxiga, metoprolol XL and Entresto. ?

## 2022-04-16 NOTE — Anesthesia Postprocedure Evaluation (Signed)
Anesthesia Post Note ? ?Patient: Gregory [name] ? ?Procedure(s) Performed: COLONOSCOPY WITH PROPOFOL ?ESOPHAGOGASTRODUODENOSCOPY (EGD) ?SUBMUCOSAL TATTOO INJECTION ?POLYPECTOMY ?BIOPSY ? ?  ? ?Patient location during evaluation: Endoscopy ?Anesthesia Type: MAC ?Level of consciousness: awake ?Pain management: pain level controlled ?Vital Signs Assessment: post-procedure vital signs reviewed and stable ?Respiratory status: spontaneous breathing, nonlabored ventilation, respiratory function stable and patient connected to nasal cannula oxygen ?Cardiovascular status: stable and blood pressure returned to baseline ?Postop Assessment: no apparent nausea or vomiting ?Anesthetic complications: no ? ? ?No notable events documented. ? ?Last Vitals:  ?Vitals:  ? 04/16/22 1652 04/16/22 1701  ?BP: 127/86 128/90  ?Pulse: 62 65  ?Resp:  13  ?Temp:    ?SpO2: 100% 95%  ?  ?Last Pain:  ?Vitals:  ? 04/16/22 1633  ?TempSrc: Oral  ?PainSc:   ? ? ?  ?  ?  ?  ?  ?  ? ?Kirk Basquez P Christhoper Busbee ? ? ? ? ?

## 2022-04-16 NOTE — Progress Notes (Signed)
?   04/16/22 2100  ?Vitals  ?BP 130/80  ?MAP (mmHg) 96  ?BP Location Left Arm  ?BP Method Automatic  ?Patient Position (if appropriate) Lying  ?Pulse Rate 65  ?Pulse Rate Source Monitor  ?ECG Heart Rate 65  ?Oxygen Therapy  ?SpO2 100 %  ?MEWS Score  ?MEWS Temp 0  ?MEWS Systolic 0  ?MEWS Pulse 0  ?MEWS RR 0  ?MEWS LOC 0  ?MEWS Score 0  ?MEWS Score Color Green  ? ? ? ? ?Patient successfully transported to [facility] ?

## 2022-04-16 NOTE — Interval H&P Note (Signed)
History and Physical Interval Note: Patient stable for EGD and colonoscopy today. Denies any complaints. Hgb stable at 8.0 this AM. I have discussed risks / benefits of EGD , colonoscopy, and anesthesia with him and he understands and wants to proceed. 5 days out from Eliquis/ plavix. Otherwise feels well today without complaints. ? ?04/16/2022 ?1:16 PM ? ?Gregory [name]  has presented today for surgery, with the diagnosis of Severe anemia.  Status post 4 PRBCs.  Chronic Eliquis, Plavix on hold.  FOBT positive.  The various methods of treatment have been discussed with the patient and family. After consideration of risks, benefits and other options for treatment, the patient has consented to  Procedure(s): ?COLONOSCOPY WITH PROPOFOL (N/A) ?ESOPHAGOGASTRODUODENOSCOPY (EGD) (N/A) as a surgical intervention.  The patient's history has been reviewed, patient examined, no change in status, stable for surgery.  I have reviewed the patient's chart and labs.  Questions were answered to the patient's satisfaction.   ? ? ?Carlota Raspberry Farrie Sann ? ? ?

## 2022-04-16 NOTE — Anesthesia Procedure Notes (Signed)
Procedure Name: [place] ?Date/Time: 04/16/2022 2:20 PM ?Performed by: Leonor Liv, CRNA ?Pre-anesthesia Checklist: Patient identified, Emergency Drugs available, Suction available and Patient being monitored ?Oxygen Delivery Method: Nasal cannula ?Placement Confirmation: positive ETCO2 ? ? ? ? ?

## 2022-04-16 NOTE — Assessment & Plan Note (Signed)
Continue Keppra.

## 2022-04-16 NOTE — Assessment & Plan Note (Addendum)
Recurrent VT s/p AICD ?Follows with Dr. Caryl Comes, referred to Dr. Lovena Le for ablation, can follow-up as outpatient. ?Was on IV amiodarone peri procedures, IV amiodarone discontinued. ?Continue p.o. amiodarone 400 Mg daily. ?Patient had extended, 10+ minutes of asymptomatic slow sustained VT in the 100s that around 6 AM on 5/9 and again sustained VT for 14 minutes this afternoon. ?Requested cardiology to reassess, have discussed with EP cardiology who did not recommend any changes.  Continue current dose of Toprol-XL and amiodarone. ?

## 2022-04-16 NOTE — Plan of Care (Signed)
  Problem: Education: Goal: Understanding of CV disease, CV risk reduction, and recovery process will improve Outcome: Progressing Goal: Individualized Educational Video(s) Outcome: Progressing   Problem: Activity: Goal: Ability to return to baseline activity level will improve Outcome: Progressing   

## 2022-04-16 NOTE — Assessment & Plan Note (Addendum)
Continue metoprolol and oral amiodarone. ?Eliquis on hold.  Eliquis to be resumed when cleared by general surgery. ?

## 2022-04-16 NOTE — Op Note (Addendum)
[facility] ?Patient Name: Gregory [name] ?Procedure Date : 04/16/2022 ?MRN: [id number] ?Attending MD: Carlota Raspberry. Havery Moros , MD ?Date of Birth: Dec 28, 1960 ?CSN: [id number] ?Age: 61 ?Admit Type: Inpatient ?Procedure:                Colonoscopy ?Indications:              Heme positive stool, Iron deficiency anemia - on  ?                          Plavix and Eliquis - held for 5 days ?Providers:                Carlota Raspberry. Havery Moros, MD, Allayne Gitelman, RN, Alphonzo Grieve  ?                          Leighton Roach, Technician ?Referring MD:              ?Medicines:                Monitored Anesthesia Care ?Complications:            No immediate complications. Estimated blood loss:  ?                          Minimal. ?Estimated Blood Loss:     Estimated blood loss was minimal. ?Procedure:                Pre-Anesthesia Assessment: ?                          - Prior to the procedure, a History and Physical  ?                          was performed, and patient medications and  ?                          allergies were reviewed. The patient's tolerance of  ?                          previous anesthesia was also reviewed. The risks  ?                          and benefits of the procedure and the sedation  ?                          options and risks were discussed with the patient.  ?                          All questions were answered, and informed consent  ?                          was obtained. Prior Anticoagulants: The patient has  ?                          taken Plavix (clopidogrel), last dose was 5 days  ?  prior to procedure. ASA Grade Assessment: III - A  ?                          patient with severe systemic disease. After  ?                          reviewing the risks and benefits, the patient was  ?                          deemed in satisfactory condition to undergo the  ?                          procedure. ?                          After obtaining informed consent, the colonoscope  ?                           was passed under direct vision. Throughout the  ?                          procedure, the patient's blood pressure, pulse, and  ?                          oxygen saturations were monitored continuously. The  ?                          CF-HQ190L ([id number]) Olympus coloscope was  ?                          introduced through the anus with the intention of  ?                          advancing to the cecum. The scope was advanced to  ?                          the hepatic flexure before the procedure was  ?                          aborted. Medications were given. The patient  ?                          tolerated the procedure well. The colonoscopy was  ?                          technically difficult and complex due to inadequate  ?                          bowel prep. ?Scope In: 2:32:50 PM ?Scope Out: 3:25:47 PM ?Total Procedure Duration: 0 hours 52 minutes 57 seconds  ?Findings: ?     The perianal and digital rectal examinations were normal. ?     A large amount of semi-liquid stool was found in the entire colon,  ?     making visualization difficult, worst in  more proximal colon. Lavage of  ?     the colon was performed using copious amounts of sterile water,  ?     resulting in clearance with mostly adequate visualization. This process  ?     was tedious, scope clogged numerous times leading to brushings and  ?     prolonged the exam. Of note, additional small or flat polyps may not  ?     have been seen due to prep. ?     A partially obstructing large mass was found in the suspect proximal  ?     transverse colon, although could have been hepatic flexure or distal  ?     ascending colon, hard to tell. The mass was circumferential, lumen was  ?     narrowed, could not traverse it. Biopsies were taken with a cold forceps  ?     for histology. Area distal to the mass was tattooed with an injection of  ?     Spot (carbon black). There were multiple medium sized polyps surrounding  ?     the mass  which were not removed due to prep, tattoo was distal to these  ?     as well. ?     Five sessile polyps were found in the transverse colon. The polyps were  ?     3 to 10 mm in size. These polyps were removed with a cold snare.  ?     Resection and retrieval were complete. ?     Two pedunculated polyps were found in the transverse colon. The polyps  ?     were 6 to 8 mm in size. These polyps were removed with a hot snare.  ?     Resection and retrieval were complete. ?     A 6 mm polyp was found in the descending colon. The polyp was  ?     pedunculated. The polyp was removed with a cold snare. Resection and  ?     retrieval were complete. ?     Two sessile polyps were found in the sigmoid colon. The polyps were 3 to  ?     4 mm in size. These polyps were removed with a cold snare. Resection and  ?     retrieval were complete. ?     A 15 mm polyp was found in the sigmoid colon. The polyp was  ?     pedunculated. The polyp was removed with a hot snare. Resection and  ?     retrieval were complete. ?     A 3 to 4 mm polyp was found in the recto-sigmoid colon. The polyp was  ?     sessile. The polyp was removed with a cold snare. Resection and  ?     retrieval were complete. ?     Internal hemorrhoids were found during retroflexion. The hemorrhoids  ?     were moderate. ?     The exam was otherwise without abnormality. ?Impression:               - Stool in the entire examined colon, several  ?                          minutes spent lavaging the colon ?                          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                          the suspected proximal transverse colon vs. right  ?                          colon as above - could not traverse it. Biopsied.  ?                          Tattooed. ?                          - Five 3 to 10 mm polyps in the transverse colon,  ?                          removed with a cold snare. Resected and retrieved. ?                          - Two 6 to 8 mm polyps  in the transverse colon,  ?                          removed with a hot snare. Resected and retrieved. ?                          - One 6 mm polyp in the descending colon, removed  ?                          with a cold snare. Resected and retrieved. ?                          - Two 3 to 4 mm polyps in the sigmoid colon,  ?                          removed with a cold snare. Resected and retrieved. ?                          - One 15 mm polyp in the sigmoid colon, removed  ?                          with a hot snare. Resected and retrieved. ?                          - One 3 to 4 mm polyp at the recto-sigmoid colon,  ?                          removed with a cold snare. Resected and retrieved. ?                          - Internal hemorrhoids. ?                          - The examination was otherwise normal. ?  Colon mass is likely malignant and the cause of  ?                          anemia / bleeding. ?Recommendation:           - Return patient to hospital ward for ongoing care. ?                          - Advance diet as tolerated. ?                          - Continue present medications. ?                          - Miralax BID to keep stools loose and prevent  ?                          impaction ?                          - Await pathology results. ?                          - Monitor Hgb ?                          - CT scan chest / abdomen / pelvis for staging.  ?                          Will also need surgical consult ?                          - Will need to discuss findings with cardiology  ?                          service - high risk for bleeding with resumption of  ?                          anticoagulation / antiplatelet therapy until  ?                          definitive therapy of mass lesion ?Procedure Code(s):        --- Professional --- ?                          [phone], 52, Colonoscopy, flexible; with removal of  ?                          tumor(s), polyp(s), or  other lesion(s) by snare  ?                          technique ?                          [id number], 52, Colonoscopy, flexible; with directed  ?  submucosal injection(s), any substa

## 2022-04-16 NOTE — Assessment & Plan Note (Deleted)
As noted above 

## 2022-04-16 NOTE — Assessment & Plan Note (Addendum)
[facility] 09/24/2020: Known total occlusion of right coronary artery and previously placed stents were widely patent. ?No anginal symptoms. ?As per communication with [facility], may resume Plavix 5/10.  Ordered. ?Underwent cardiac cath 5/4: Per cardiology, anatomy similar to 2018 cath except new 50% stenosis proximal to OM stent and 50% stenosis proximal to diagonal stent.  No new high-grade significant stenosis. ?Continue beta-blockers, statins and Zetia. ?

## 2022-04-16 NOTE — Transfer of Care (Signed)
Immediate Anesthesia Transfer of Care Note ? ?Patient: Gregory [name] ? ?Procedure(s) Performed: COLONOSCOPY WITH PROPOFOL ?ESOPHAGOGASTRODUODENOSCOPY (EGD) ?SUBMUCOSAL TATTOO INJECTION ?POLYPECTOMY ?BIOPSY ? ?Patient Location: Endoscopy Unit ? ?Anesthesia Type:MAC ? ?Level of Consciousness: awake ? ?Airway & Oxygen Therapy: Patient Spontanous Breathing ? ?Post-op Assessment: Report given to RN and Post -op Vital signs reviewed and stable ? ?Post vital signs: Reviewed and stable ? ?Last Vitals:  ?Vitals Value Taken Time  ?BP 87/39 04/16/22 1540  ?Temp    ?Pulse 51 04/16/22 1541  ?Resp 18 04/16/22 1541  ?SpO2 99 % 04/16/22 1541  ?Vitals shown include unvalidated device data. ? ?Last Pain:  ?Vitals:  ? 04/16/22 1540  ?TempSrc:   ?PainSc: 0-No pain  ?   ? ?  ? ?Complications: To Endo recovery on Neo drip. See MAR.  ?

## 2022-04-16 NOTE — Progress Notes (Signed)
Pt will self administer CPAP when he is ready for night rest. ?

## 2022-04-16 NOTE — Hospital Course (Addendum)
61 year old male with medical history significant for CAD s/p stent, chronic systolic CHF, ventricular tachycardia, ICD placement, recurrent VTE, atrial fibrillation, type II DM, had lab work done by his cardiologist and was found to have hemoglobin in the 4 g range and was advised to come to the ED.  Admitted for severe symptomatic anemia in the absence of overt GI bleed, stool chronically dark on iron, FOBT +.  S/p EGD and colonoscopy 5/3-showed partially obstructing colonic mass suspicious for colon cancer.  General surgery and medical oncology consulted.  S/p cardiac cath 5/4.  S/p hand-assisted laparoscopic right colectomy 5/5.  Ongoing postop ileus, flatus +, no BM yet.  Having periodic runs of sustained VT, asymptomatic, cardiology aware. ?

## 2022-04-16 NOTE — Anesthesia Preprocedure Evaluation (Addendum)
Anesthesia Evaluation  ?Patient identified by MRN, date of birth, ID band ?Patient awake ? ? ? ?Reviewed: ?Allergy & Precautions, NPO status , Patient's Chart, lab work & pertinent test results, reviewed documented beta blocker date and time  ? ?History of Anesthesia Complications ?Negative for: history of anesthetic complications ? ?Airway ?Mallampati: II ? ?TM Distance: >3 FB ?Neck ROM: Full ? ? ? Dental ?no notable dental hx. ? ?  ?Pulmonary ?sleep apnea and Continuous Positive Airway Pressure Ventilation , former smoker,  ?  ?Pulmonary exam normal ? ? ? ? ? ? ? Cardiovascular ?hypertension, Pt. on home beta blockers and Pt. on medications ?+ CAD and +CHF  ?Normal cardiovascular exam ? ?TTE 04/07/22: Difficult acoustic windows, hypokinesis of the lateral wall, mid/distal inferior wall and apex; akinesis with aneurysmal dilitation of inferior base, EF 35-40%, moderate LVE, mild LVH, mildly elevated PASP, severe LAE, mild RAE, mild MR, borderline dilatation of the aortic root measuring 55m  ?  ?Neuro/Psych ?Seizures -,  negative psych ROS  ? GI/Hepatic ?Neg liver ROS, GERD  Medicated and Controlled,  ?Endo/Other  ?diabetes, Type 2 ? Renal/GU ?negative Renal ROS  ?negative genitourinary ?  ?Musculoskeletal ?negative musculoskeletal ROS ?(+)  ? Abdominal ?  ?Peds ? Hematology ? ?(+) Blood dyscrasia (Hgb 8.0), anemia ,   ?Anesthesia Other Findings ?Day of surgery medications reviewed with patient. ? Reproductive/Obstetrics ?negative OB ROS ? ?  ? ? ? ? ? ? ? ? ? ? ? ? ? ?  ?  ? ? ? ? ? ? ?Anesthesia Physical ?Anesthesia Plan ? ?ASA: 3 ? ?Anesthesia Plan: MAC  ? ?Post-op Pain Management: Minimal or no pain anticipated  ? ?Induction:  ? ?PONV Risk Score and Plan: Treatment may vary due to age or medical condition and Propofol infusion ? ?Airway Management Planned: Natural Airway and Simple Face Mask ? ?Additional Equipment: None ? ?Intra-op Plan:  ? ?Post-operative Plan:  ? ?Informed  Consent: I have reviewed the patients History and Physical, chart, labs and discussed the procedure including the risks, benefits and alternatives for the proposed anesthesia with the patient or authorized representative who has indicated his/her understanding and acceptance.  ? ? ? ? ? ?Plan Discussed with: CRNA ? ?Anesthesia Plan Comments:   ? ? ? ? ? ?Anesthesia Quick Evaluation ? ?

## 2022-04-16 NOTE — Progress Notes (Signed)
?PROGRESS NOTE ?  ?Gregory [name]  HEN:[id number]    DOB: 1961-11-25    DOA: 04/11/2022 ? ?PCP: Patient, [name]  ? ?I have briefly reviewed patients previous medical records in [facility]. ? ?Chief Complaint  ?Patient presents with  ? Abnormal Lab  ? ? ?Hospital Course:  ?61 year old male with medical history significant for CAD s/p stent, chronic systolic CHF, ventricular tachycardia, ICD placement, recurrent VTE, atrial fibrillation, type II DM, had lab work done by his cardiologist and was found to have hemoglobin in the 4 g range and was advised to come to the ED.  Admitted for severe symptomatic anemia in the absence of overt GI bleed, stool chronically dark on iron, FOBT +.  Pending Plavix washout, plan for EGD and colonoscopy on 5/3.  [facility] and cardiology consulting. ? ?Assessment and Plan: ?Symptomatic anemia/iron deficiency anemia ?Longstanding history of iron deficiency anemia. ?No prior endoscopies to evaluate ?Presented with hemoglobin in the 4 g range, FOBT + in the setting of Eliquis and Plavix. ?No history of overt GI bleed.  Stool chronically dark but on iron supplements. ?Last dose of Plavix 4/28. ?Cardiology has cleared for EGD and colonoscopy which she is supposed to have on 5/3 afternoon. ?Attempt to keep hemoglobin >8.  S/p 4 units PRBC transfusion thus far.  Hemoglobin 8 today.  Follow CBC daily. ? ? ?Chronic combined systolic and diastolic CHF (congestive heart failure) (HCC)/ischemic cardiomyopathy ?Advanced heart failure team follow-up appreciated. ?Echo from 04/07/2022: LVEF 35-40%, worse compared to 09/22/2020 (LVEF 40-45%) ?Clinically euvolemic ?Continue Farxiga, metoprolol XL and Entresto. ? ?Paroxysmal atrial fibrillation ([place]) ?Cardiology has switched to IV amiodarone due to frequent NSVT's. ?Continue metoprolol. ?Eliquis on hold. ? ?Seizures ([place]) ?Continue Keppra. ? ?Ventricular tachycardia ([place]) ?Recurrent VT s/p AICD ?Follows with Dr. Caryl Comes,  referred to Dr. Lovena Le for ablation, awaiting cardiac cath pending GI evaluation. ?On 5/3, having frequent runs of NSVT, asymptomatic. ?Cardiology following and have transition to IV amiodarone for now and supplementing low potassium. ? ?OSA on CPAP ?Continue CPAP. ? ?CAD S/P percutaneous coronary angioplasty ?LHC 09/24/2020: Known total occlusion of right coronary artery and previously placed stents were widely patent. ?No anginal symptoms. ?Plavix and Eliquis on hold in the setting of severe symptomatic anemia while undergoing GI evaluation. ?As per cardiology, ultimately needs heart cath pending GI scopes. ? ?Ischemic cardiomyopathy ?As noted above. ? ?Body mass index is 24.4 kg/m?. ? ? ?DVT prophylaxis: SCDs Start: 04/12/22 0259   ?  Code Status: Full Code:  ?Family Communication: None at bedside ?Disposition:  ?Status is: Inpatient ?Remains inpatient appropriate because: EGD, colonoscopy today.  May need for cardiac cath. ?  ? ?Consultants:   ?Cardiology ?[facility] ? ?Procedures:   ?None ? ?Antimicrobials:   ?None ? ? ?Subjective:  ?Reports that someone told him he had irregular heartbeat this morning.  Patient however asymptomatic of palpitations, dizziness, lightheadedness, chest pain or dyspnea.  Denies any melena, nausea or vomiting.  Other complaints reported.  Reports that he is supposed to have his EGD/colonoscopy at 3:30 PM today. ? ?Objective:  ? ?Vitals:  ? 04/15/22 1106 04/15/22 1107 04/15/22 2045 04/16/22 0525  ?BP: 103/69  (!) 94/59 112/75  ?Pulse: 70 69 69 68  ?Resp: '19 16 17 19  '$ ?Temp: (!) 100.5 ?F (38.1 ?C)  99.6 ?F (37.6 ?C) 99.3 ?F (37.4 ?C)  ?TempSrc: Axillary  Oral Axillary  ?SpO2: 97% 97% 97% 99%  ?Weight:      ?Height:      ? ? ?  General exam: Middle-age male, moderately built and nourished sitting up comfortably in bed without distress. ?Respiratory system: Clear to auscultation. Respiratory effort normal. ?Cardiovascular system: S1 & S2 heard, RRR. No JVD, murmurs, rubs, gallops or  clicks. No pedal edema.  Telemetry personally reviewed: Remains in sinus rhythm with frequent episodes of NSVT ?Gastrointestinal system: Abdomen is nondistended, soft and nontender. No organomegaly or masses felt. Normal bowel sounds heard. ?Central nervous system: Alert and oriented. No focal neurological deficits. ?Extremities: Symmetric 5 x 5 power. ?Skin: No rashes, lesions or ulcers ?Psychiatry: Judgement and insight appear normal. Mood & affect appropriate.  ? ?Data Reviewed:   ?I have personally reviewed following labs and imaging studies ? ?CBC: ?Recent Labs  ?Lab 04/11/22 ?1219 04/11/22 ?2107 04/12/22 ?1356 04/14/22 ?0358 04/15/22 ?2952 04/16/22 ?8413  ?WBC 9.0 6.9   < > 6.6 7.4 5.3  ?NEUTROABS 6.6 4.7  --   --   --   --   ?HGB 4.1* 4.0*   < > 7.2* 7.6* 8.0*  ?HCT 16.0* 16.7*   < > 25.1* 26.2* 28.2*  ?MCV 71* 76.6*   < > 78.7* 78.9* 80.8  ?PLT 353 351   < > 232 256 210  ? < > = values in this interval not displayed.  ? ?Basic Metabolic Panel: ?Recent Labs  ?Lab 04/11/22 ?2107 04/13/22 ?0332 04/14/22 ?0358 04/15/22 ?2440 04/16/22 ?1027  ?NA 137 137 136 135 134*  ?K 4.6 4.2 4.1 4.1 3.5  ?CL 110 111 108 106 107  ?CO2 '23 23 23 23 '$ 20*  ?GLUCOSE 115* 110* 116* 113* 104*  ?BUN '14 12 14 11 12  '$ ?CREATININE 1.11 0.77 0.90 0.84 0.93  ?CALCIUM 8.1* 8.0* 7.9* 7.8* 8.0*  ?MG  --   --   --   --  2.0  ? ?Liver Function Tests: ?Recent Labs  ?Lab 04/11/22 ?2107  ?AST 13*  ?ALT 12  ?ALKPHOS 60  ?BILITOT 0.1*  ?PROT 6.2*  ?ALBUMIN 3.1*  ? ?CBG: ?Recent Labs  ?Lab 04/15/22 ?1631 04/15/22 ?2125 04/16/22 ?0740  ?GLUCAP 107* 115* 99  ? ?Microbiology Studies:  ?No results found for this or any previous visit (from the past 240 hour(s)). ? ?Radiology Studies:  ?No results found. ? ?Scheduled Meds:  ? ? sodium chloride   Intravenous Once  ? amiodarone  150 mg Intravenous Once  ? [START ON 04/17/2022] amiodarone  400 mg Oral Daily  ? atorvastatin  80 mg Oral QPM  ? dapagliflozin propanediol  10 mg Oral Daily  ? ezetimibe  10 mg Oral  Daily  ? insulin aspart  0-6 Units Subcutaneous TID WC  ? levETIRAcetam  500 mg Oral BID  ? metoprolol succinate  25 mg Oral BID  ? pantoprazole  40 mg Oral BID  ? sacubitril-valsartan  1 tablet Oral BID  ? ? ?Continuous Infusions:  ? ? amiodarone 60 mg/hr (04/16/22 0800)  ? amiodarone    ? potassium chloride 10 mEq (04/16/22 0759)  ? ? ? LOS: 4 days  ? ?Vernell Leep, MD,  ?FACP, [facility], [facility] (Care Management Physician Certified) ?[facility]ician Advisor ?[place] ? ?To contact the attending provider between 7A-7P or the covering provider during after hours 7P-7A, please log into the web site [email or web address] and access using universal [place] password for that web site. If you do not have the password, please call the hospital operator. ? ?04/16/2022, 9:52 AM  ? ?

## 2022-04-16 NOTE — Assessment & Plan Note (Signed)
Continue CPAP.  

## 2022-04-17 ENCOUNTER — Encounter (HOSPITAL_COMMUNITY)
Admission: EM | Disposition: A | Discharge status: Home / Self Care | Payer: Self-pay | Source: Home / Self Care | Attending: Internal Medicine

## 2022-04-17 ENCOUNTER — Encounter (HOSPITAL_COMMUNITY): Payer: Self-pay | Admitting: Internal Medicine

## 2022-04-17 DIAGNOSIS — C189 Malignant neoplasm of colon, unspecified: Secondary | ICD-10-CM | POA: Diagnosis present

## 2022-04-17 DIAGNOSIS — E876 Hypokalemia: Secondary | ICD-10-CM

## 2022-04-17 DIAGNOSIS — I472 Ventricular tachycardia, unspecified: Secondary | ICD-10-CM

## 2022-04-17 DIAGNOSIS — C182 Malignant neoplasm of ascending colon: Principal | ICD-10-CM

## 2022-04-17 DIAGNOSIS — C184 Malignant neoplasm of transverse colon: Secondary | ICD-10-CM | POA: Diagnosis not present

## 2022-04-17 DIAGNOSIS — Z7901 Long term (current) use of anticoagulants: Secondary | ICD-10-CM | POA: Diagnosis not present

## 2022-04-17 DIAGNOSIS — D649 Anemia, unspecified: Secondary | ICD-10-CM | POA: Diagnosis not present

## 2022-04-17 DIAGNOSIS — I2510 Atherosclerotic heart disease of native coronary artery without angina pectoris: Secondary | ICD-10-CM | POA: Diagnosis not present

## 2022-04-17 HISTORY — PX: LEFT HEART CATH AND CORONARY ANGIOGRAPHY: CATH118249

## 2022-04-17 LAB — CBC
HCT: 26.8 % — ABNORMAL LOW (ref 39.0–52.0)
Hemoglobin: 7.5 g/dL — ABNORMAL LOW (ref 13.0–17.0)
MCH: 22.3 pg — ABNORMAL LOW (ref 26.0–34.0)
MCHC: 28 g/dL — ABNORMAL LOW (ref 30.0–36.0)
MCV: 79.5 fL — ABNORMAL LOW (ref 80.0–100.0)
Platelets: 218 10*3/uL (ref 150–400)
RBC: 3.37 MIL/uL — ABNORMAL LOW (ref 4.22–5.81)
RDW: 21.2 % — ABNORMAL HIGH (ref 11.5–15.5)
WBC: 6.4 10*3/uL (ref 4.0–10.5)
nRBC: 0 % (ref 0.0–0.2)

## 2022-04-17 LAB — BASIC METABOLIC PANEL
Anion gap: 5 (ref 5–15)
BUN: 9 mg/dL (ref 8–23)
CO2: 24 mmol/L (ref 22–32)
Calcium: 7.8 mg/dL — ABNORMAL LOW (ref 8.9–10.3)
Chloride: 107 mmol/L (ref 98–111)
Creatinine, Ser: 1.06 mg/dL (ref 0.61–1.24)
GFR, Estimated: >60 mL/min (ref >60)
Glucose, Bld: 102 mg/dL — ABNORMAL HIGH (ref 70–99)
Potassium: 3.2 mmol/L — ABNORMAL LOW (ref 3.5–5.1)
Sodium: 136 mmol/L (ref 135–145)

## 2022-04-17 LAB — SURGICAL PCR SCREEN
MRSA, PCR: NEGATIVE
Staphylococcus aureus: NEGATIVE

## 2022-04-17 LAB — MAGNESIUM: Magnesium: 2.1 mg/dL (ref 1.7–2.4)

## 2022-04-17 LAB — GLUCOSE, CAPILLARY
Glucose-Capillary: 96 mg/dL (ref 70–99)
Glucose-Capillary: 113 mg/dL — ABNORMAL HIGH (ref 70–99)
Glucose-Capillary: 143 mg/dL — ABNORMAL HIGH (ref 70–99)
Glucose-Capillary: 117 mg/dL — ABNORMAL HIGH (ref 70–99)
Glucose-Capillary: 153 mg/dL — ABNORMAL HIGH (ref 70–99)

## 2022-04-17 LAB — PREPARE RBC (CROSSMATCH)

## 2022-04-17 SURGERY — LEFT HEART CATH AND CORONARY ANGIOGRAPHY
Anesthesia: LOCAL

## 2022-04-17 MED ORDER — SODIUM CHLORIDE 0.9% FLUSH
3.0000 mL | Freq: Two times a day (BID) | INTRAVENOUS | Status: DC
  Administered 2022-04-18 – 2022-04-22 (×8): 3 mL via INTRAVENOUS

## 2022-04-17 MED ORDER — SODIUM CHLORIDE 0.9 % IV SOLN: INTRAVENOUS | Status: AC

## 2022-04-17 MED ORDER — SODIUM CHLORIDE 0.9 % IV SOLN: 250.0000 mL | INTRAVENOUS | Status: DC | PRN

## 2022-04-17 MED ORDER — MIDAZOLAM HCL 2 MG/2ML IJ SOLN
INTRAMUSCULAR | Status: DC | PRN
  Administered 2022-04-17: 1 mg via INTRAVENOUS

## 2022-04-17 MED ORDER — SODIUM CHLORIDE 0.9% FLUSH: 3.0000 mL | INTRAVENOUS | Status: DC | PRN

## 2022-04-17 MED ORDER — SODIUM CHLORIDE 0.9 % WEIGHT BASED INFUSION: 1.0000 mL/kg/h | INTRAVENOUS | Status: DC

## 2022-04-17 MED ORDER — IOHEXOL 350 MG/ML SOLN
INTRAVENOUS | Status: DC | PRN
  Administered 2022-04-17: 85 mL

## 2022-04-17 MED ORDER — HEPARIN SODIUM (PORCINE) 5000 UNIT/ML IJ SOLN
5000.0000 [IU] | Freq: Three times a day (TID) | INTRAMUSCULAR | Status: DC
  Administered 2022-04-17: 5000 [IU] via SUBCUTANEOUS
  Filled 2022-04-17 (×2): qty 1

## 2022-04-17 MED ORDER — POTASSIUM CHLORIDE CRYS ER 20 MEQ PO TBCR
40.0000 meq | EXTENDED_RELEASE_TABLET | ORAL | Status: AC
  Administered 2022-04-17 (×2): 40 meq via ORAL
  Filled 2022-04-17 (×2): qty 2

## 2022-04-17 MED ORDER — OXYCODONE HCL 5 MG PO TABS
5.0000 mg | ORAL_TABLET | ORAL | Status: DC | PRN
  Administered 2022-04-19 – 2022-04-24 (×10): 10 mg via ORAL
  Filled 2022-04-17 (×11): qty 2

## 2022-04-17 MED ORDER — SODIUM CHLORIDE 0.9 % WEIGHT BASED INFUSION
3.0000 mL/kg/h | INTRAVENOUS | Status: AC
  Administered 2022-04-17: 3 mL/kg/h via INTRAVENOUS

## 2022-04-17 MED ORDER — ACETAMINOPHEN 325 MG PO TABS: 650.0000 mg | ORAL_TABLET | ORAL | Status: DC | PRN

## 2022-04-17 MED ORDER — ONDANSETRON HCL 4 MG/2ML IJ SOLN: 4.0000 mg | Freq: Four times a day (QID) | INTRAMUSCULAR | Status: DC | PRN

## 2022-04-17 MED ORDER — HYDRALAZINE HCL 20 MG/ML IJ SOLN: 10.0000 mg | INTRAMUSCULAR | Status: AC | PRN

## 2022-04-17 MED ORDER — HEPARIN (PORCINE) IN NACL 1000-0.9 UT/500ML-% IV SOLN
INTRAVENOUS | Status: AC
  Filled 2022-04-17: qty 1000

## 2022-04-17 MED ORDER — LIDOCAINE HCL (PF) 1 % IJ SOLN
INTRAMUSCULAR | Status: DC | PRN
  Administered 2022-04-17: 15 mL

## 2022-04-17 MED ORDER — SODIUM CHLORIDE 0.9% FLUSH
3.0000 mL | Freq: Two times a day (BID) | INTRAVENOUS | Status: DC
  Administered 2022-04-17 – 2022-04-25 (×8): 3 mL via INTRAVENOUS

## 2022-04-17 MED ORDER — SODIUM CHLORIDE 0.9% IV SOLUTION: Freq: Once | INTRAVENOUS | Status: AC

## 2022-04-17 MED ORDER — LIDOCAINE HCL (PF) 1 % IJ SOLN
INTRAMUSCULAR | Status: AC
  Filled 2022-04-17: qty 30

## 2022-04-17 MED ORDER — HEPARIN (PORCINE) IN NACL 1000-0.9 UT/500ML-% IV SOLN
INTRAVENOUS | Status: DC | PRN
  Administered 2022-04-17 (×2): 500 mL

## 2022-04-17 MED ORDER — MIDAZOLAM HCL 2 MG/2ML IJ SOLN
INTRAMUSCULAR | Status: AC
  Filled 2022-04-17: qty 2

## 2022-04-17 MED ORDER — FENTANYL CITRATE (PF) 100 MCG/2ML IJ SOLN
INTRAMUSCULAR | Status: DC | PRN
  Administered 2022-04-17: 50 ug via INTRAVENOUS
  Administered 2022-04-17: 25 ug via INTRAVENOUS

## 2022-04-17 MED ORDER — SODIUM CHLORIDE 0.9% FLUSH
3.0000 mL | INTRAVENOUS | Status: DC | PRN
  Administered 2022-04-18: 3 mL via INTRAVENOUS

## 2022-04-17 MED ORDER — LABETALOL HCL 5 MG/ML IV SOLN: 10.0000 mg | INTRAVENOUS | Status: AC | PRN

## 2022-04-17 MED ORDER — FENTANYL CITRATE (PF) 100 MCG/2ML IJ SOLN
INTRAMUSCULAR | Status: AC
  Filled 2022-04-17: qty 2

## 2022-04-17 SURGICAL SUPPLY — 11 items
CATH 5FR JL4 DIAGNOSTIC (CATHETERS) ×1 IMPLANT
CATH INFINITI 5FR MPB2 (CATHETERS) ×1 IMPLANT
CATH INFINITI JR4 5F (CATHETERS) ×1 IMPLANT
CLOSURE MYNX CONTROL 5F (Vascular Products) ×1 IMPLANT
KIT HEART LEFT (KITS) ×2 IMPLANT
PACK CARDIAC CATHETERIZATION (CUSTOM PROCEDURE TRAY) ×2 IMPLANT
SHEATH PINNACLE 5F 10CM (SHEATH) ×1 IMPLANT
SHEATH PROBE COVER 6X72 (BAG) ×1 IMPLANT
TRANSDUCER W/STOPCOCK (MISCELLANEOUS) ×2 IMPLANT
TUBING CIL FLEX 10 FLL-RA (TUBING) ×2 IMPLANT
WIRE EMERALD 3MM-J .035X150CM (WIRE) ×1 IMPLANT

## 2022-04-17 NOTE — Consult Note (Signed)
? ? ? ?[place] ?[birth year]  ?[id number].   ? ?Requesting MD: Algis Liming, MD ?Chief Complaint/Reason for Consult: Colon mass ? ?HPI:  ?Gregory [name] is a 61 year old male with a past medical history of CAD status post PCI to LAD, recurrent ventricular tachycardia with history of AICD placement, ischemic cardiomyopathy with an ejection fraction of 35 to 40%, A-fib on Eliquis, DM2, and obstructive sleep apnea who presented to the emergency department at the instruction of his cardiologist after he was found to have a hemoglobin of 4.  He was undergoing outpatient work-up for worsening runs of ventricular tachycardia. he was transfused 4 units of PRBCs with appropriate rise in his hemoglobin which is now stable.  Patient reports he has chronically dark stools due to iron supplementation.  He denies maroon or bright red blood in his stool.  GI was consulted and the patient underwent a colonoscopy on 04/16/2022 which was significant for an distal ascending versus proximal transverse colon mass.  General surgery has been asked to consult for consideration of partial colectomy.  ? ?Today the patient denies abdominal pain, nausea, vomiting, or constipation.  He confirms history of melena.  He does report weakness, fatigue, and dyspnea on exertion over the last week or so.  He reports weight loss of over 100 pounds last year during a hospitalization, but no other significant weight loss.  Until yesterday he had never had a colonoscopy.  States he is a former smoker who quit close to 10 years ago.  Denies alcohol or drug use.  States he is independent of activities of daily living and mobilizes without a cane or a walker.  Lives with his son.  He is currently on Plavix/Eliquis which have been held since 04/11/2022. ? ?ROS: ?Review of Systems  ?Constitutional:  Positive for malaise/fatigue and weight loss.  ?HENT: Negative.    ?Eyes: Negative.   ?Respiratory:  Positive for shortness of breath.   ?Cardiovascular:  Negative.   ?Gastrointestinal:  Positive for melena.  ?Genitourinary: Negative.   ?Musculoskeletal: Negative.   ?Skin: Negative.   ?Neurological: Negative.   ?Endo/Heme/Allergies:  Bruises/bleeds easily.  ?Psychiatric/Behavioral: Negative.    ? ?Family History  ?Problem Relation Age of Onset  ? Heart disease Mother   ? Brain cancer Father   ? Hypertension Daughter   ? ? ?Past Medical History:  ?Diagnosis Date  ? CAD (coronary artery disease) 12/01/2013  ? Chronic combined systolic and diastolic CHF, NYHA class 1 ([place]) 12/01/2013  ? Erectile dysfunction 12/01/2013  ? HTN (hypertension) 12/01/2013  ? Hyperlipidemia 12/01/2013  ? Ischemic cardiomyopathy 12/01/2013  ? Sleep apnea   ? ? ?Past Surgical History:  ?Procedure Laterality Date  ? ACHILLES TENDON REPAIR    ? lft foot  ? CARDIAC CATHETERIZATION N/A 05/05/2016  ? Procedure: Left Heart Cath and Coronary Angiography;  Surgeon: Leonie Man, MD;  Location: [facility];  Service: Cardiovascular;  Laterality: N/A;  ? CARDIAC DEFIBRILLATOR PLACEMENT    ? CARDIOVERSION N/A 09/13/2020  ? Procedure: CARDIOVERSION;  Surgeon: Werner Lean, MD;  Location: [facility];  Service: Cardiovascular;  Laterality: N/A;  ? CARDIOVERSION N/A 09/19/2020  ? Procedure: CARDIOVERSION;  Surgeon: [name], MD;  Location: [facility];  Service: Cardiovascular;  Laterality: N/A;  ? EP IMPLANTABLE DEVICE N/A 12/19/2016  ? Procedure: ICD Generator Changeout;  Surgeon: [name], MD;  Location: [facility];  Service: Cardiovascular;  Laterality: N/A;  ? RIGHT/LEFT HEART CATH AND CORONARY ANGIOGRAPHY N/A 09/24/2020  ?  Procedure: RIGHT/LEFT HEART CATH AND CORONARY ANGIOGRAPHY;  Surgeon: Lorretta Harp, MD;  Location: [facility] [place] [facility];  Service: Cardiovascular;  Laterality: N/A;  ? WRIST TENODESIS    ? ? ?Social History:  reports that he quit smoking about 8 years ago. His smoking use included cigarettes. He has never used smokeless tobacco. He reports that  he does not currently use alcohol. He reports that he does not use drugs. ? ?Allergies: No Known Allergies ? ?Medications Prior to Admission  ?Medication Sig Dispense Refill  ? amiodarone (PACERONE) 200 MG tablet Take 1 tablet (200 mg total) by mouth daily. 90 tablet 1  ? apixaban (ELIQUIS) 5 MG TABS tablet TAKE 1 TABLET(5 MG) BY MOUTH TWICE DAILY (Patient taking differently: Take 5 mg by mouth 2 (two) times daily.) 180 tablet 1  ? atorvastatin (LIPITOR) 80 MG tablet Take 1 tablet (80 mg total) by mouth every evening. 90 tablet 3  ? clopidogrel (PLAVIX) 75 MG tablet TAKE 1 TABLET(75 MG) BY MOUTH DAILY (Patient taking differently: Take 75 mg by mouth daily. TAKE 1 TABLET(75 MG) BY MOUTH DAILY) 90 tablet 2  ? ezetimibe (ZETIA) 10 MG tablet Take 1 tablet (10 mg total) by mouth daily. 90 tablet 3  ? FARXIGA 10 MG TABS tablet TAKE 1 TABLET(10 MG) BY MOUTH DAILY BEFORE BREAKFAST (Patient taking differently: Take 10 mg by mouth daily.) 30 tablet 5  ? furosemide (LASIX) 20 MG tablet Take 2 tablets (40 mg total) by mouth 3 (three) times a week. (Patient taking differently: Take 40 mg by mouth every Monday, Wednesday, and Friday.) 80 tablet 1  ? levETIRAcetam (KEPPRA) 500 MG tablet TAKE 1 TABLET(500 MG) BY MOUTH TWICE DAILY (Patient taking differently: Take 500 mg by mouth 2 (two) times daily.) 180 tablet 0  ? metoprolol succinate (TOPROL-XL) 25 MG 24 hr tablet Take one tablet, 25 mg, in the morning and 50 mg, two tablets, in the evening. (Patient taking differently: 25 mg. Per patient taking 25 mg in the morning and 25 mg at night) 135 tablet 3  ? pantoprazole (PROTONIX) 40 MG tablet TAKE 1 TABLET(40 MG) BY MOUTH DAILY (Patient taking differently: Take 40 mg by mouth daily. TAKE 1 TABLET(40 MG) BY MOUTH DAILY) 30 tablet 2  ? potassium chloride (KLOR-CON M) 10 MEQ tablet Take 1 tablet (10 mEq total) by mouth daily. 30 tablet 6  ? sacubitril-valsartan (ENTRESTO) 49-51 MG Take 1 tablet by mouth 2 (two) times daily. 180 tablet 1   ? sildenafil (VIAGRA) 50 MG tablet Take 1 tablet (50 mg total) by mouth as needed for erectile dysfunction. 30 tablet 4  ? ? ? ?Physical Exam: ?Blood pressure 110/75, pulse 66, temperature 97.9 ?F (36.6 ?C), temperature source Oral, resp. rate 19, height '6\' 6"'$  (1.981 m), weight 95.8 kg, SpO2 97 %. ?General: Pleasant male sitting in hospital bed, appears stated age, NAD. ?HEENT: head -normocephalic, atraumatic; Eyes: Anicteric sclerae, prolapse of left orbital adipose tissue ?Neck- Trachea is midline, no thyromegaly or JVD appreciated.  ?CV-in sinus rhythm during my exam ?Pulm- breathing is non-labored. CTABL, no wheezes, rhales, rhonchi. ?Abd- soft, nontender, nondistended, no appreciable organomegaly no hernias ?GU- deferred  ?MSK- UE/LE symmetrical, no cyanosis, clubbing, or edema. ?Neuro- CN II-XII grossly in tact, gait not assessed ?Psych- Alert and Oriented x3 with appropriate affect ?Skin: warm and dry, no rashes or lesions ? ? ?Results for orders placed or performed during the hospital encounter of 04/11/22 (from the past 48 hour(s))  ?Glucose, capillary  Status: Abnormal  ? Collection Time: 04/15/22 11:03 AM  ?Result Value Ref Range  ? Glucose-Capillary 110 (H) 70 - 99 mg/dL  ?  Comment: Glucose reference range applies only to samples taken after fasting for at least 8 hours.  ?Glucose, capillary     Status: Abnormal  ? Collection Time: 04/15/22  4:31 PM  ?Result Value Ref Range  ? Glucose-Capillary 107 (H) 70 - 99 mg/dL  ?  Comment: Glucose reference range applies only to samples taken after fasting for at least 8 hours.  ?Glucose, capillary     Status: Abnormal  ? Collection Time: 04/15/22  9:25 PM  ?Result Value Ref Range  ? Glucose-Capillary 115 (H) 70 - 99 mg/dL  ?  Comment: Glucose reference range applies only to samples taken after fasting for at least 8 hours.  ?CBC     Status: Abnormal  ? Collection Time: 04/16/22  3:18 AM  ?Result Value Ref Range  ? WBC 5.3 4.0 - 10.5 K/uL  ? RBC 3.49 (L) 4.22  - 5.81 MIL/uL  ? Hemoglobin 8.0 (L) 13.0 - 17.0 g/dL  ? HCT 28.2 (L) 39.0 - 52.0 %  ? MCV 80.8 80.0 - 100.0 fL  ? MCH 22.9 (L) 26.0 - 34.0 pg  ? MCHC 28.4 (L) 30.0 - 36.0 g/dL  ? RDW 21.2 (H) 11.5 - 1

## 2022-04-17 NOTE — Progress Notes (Signed)
?PROGRESS NOTE ?  ?Gregory [name]  BTD:[id number]    DOB: 05/20/1961    DOA: 04/11/2022 ? ?PCP: Patient, [name]  ? ?I have briefly reviewed patients previous medical records in [facility]. ? ?Chief Complaint  ?Patient presents with  ? Abnormal Lab  ? ? ?Hospital Course:  ?61 year old male with medical history significant for CAD s/p stent, chronic systolic CHF, ventricular tachycardia, ICD placement, recurrent VTE, atrial fibrillation, type II DM, had lab work done by his cardiologist and was found to have hemoglobin in the 4 g range and was advised to come to the ED.  Admitted for severe symptomatic anemia in the absence of overt GI bleed, stool chronically dark on iron, FOBT +.  S/p EGD and colonoscopy 5/3-showed partially obstructing colonic mass suspicious for colon cancer.  General surgery and medical oncology consulted.  S/p cardiac cath 5/4.  General surgery plans laparoscopic right colectomy pending cardiology preop evaluation. ? ?Assessment and Plan: ?* Colon cancer ([place]) ?Presented with severe symptomatic anemia. ?Work-up including colonoscopy finally revealed distal ascending versus proximal transverse colon partially obstructing mass ?CT C/A/P confirms infiltrative mass in the ascending colon concerning for primary colonic neoplasm measuring approximately 6.5 x 4.7 x 7.4 cm with evidence of local infiltration.  Detailed report as below. ?General surgery consulted and plan laparoscopic right colectomy pending cardiac clearance. ?Medical oncology consulted as well. ? ?Symptomatic anemia/iron deficiency anemia ?Longstanding history of iron deficiency anemia. ?No prior endoscopies to evaluate ?Presented with hemoglobin in the 4 g range, FOBT + in the setting of Eliquis and Plavix. ?No history of overt GI bleed.  Stool chronically dark but on iron supplements. ?Last dose of Plavix 4/28. ?Attempt to keep hemoglobin >8.  S/p 4 units PRBC transfusion thus far.  Hemoglobin 7.6 today.   Will transfuse additional unit of PRBC.  Follow CBC daily. ?EGD and colonoscopy results as below. ? ?Chronic combined systolic and diastolic CHF (congestive heart failure) (HCC)/ischemic cardiomyopathy ?Cardiology team follow-up appreciated. ?Echo from 04/07/2022: LVEF 35-40%, worse compared to 09/22/2020 (LVEF 40-45%) ?Clinically euvolemic ?Continue Farxiga, metoprolol XL and Entresto. ? ?Hypokalemia ?Replace aggressively and follow.  Magnesium normal. ? ?Paroxysmal atrial fibrillation ([place]) ?Cardiology has switched to IV amiodarone due to frequent NSVT's. ?Continue metoprolol. ?Eliquis on hold. ? ?Seizures ([place]) ?Continue Keppra. ? ?Ventricular tachycardia ([place]) ?Recurrent VT s/p AICD ?Follows with Dr. Caryl Comes, referred to Dr. Lovena Le for ablation, awaiting cardiac cath pending GI evaluation. ?On 5/3, having frequent runs of NSVT, asymptomatic. ?Cardiology following and have transition to IV amiodarone for now and supplementing low potassium. ? ?OSA on CPAP ?Continue CPAP. ? ?Essential hypertension ?Controlled. ? ?CAD S/P percutaneous coronary angioplasty ?LHC 09/24/2020: Known total occlusion of right coronary artery and previously placed stents were widely patent. ?No anginal symptoms. ?Plavix and Eliquis on hold in the setting of severe symptomatic anemia and will not be able to resume until definitive management of his colonic mass/cancer which is the source of bleeding. ?Underwent cardiac cath 5/4: Per cardiology, anatomy similar to 2018 cath except new 50% stenosis proximal to OM stent and 50% stenosis proximal to diagonal stent.  No new high-grade significant stenosis. ?. ? ?Ischemic cardiomyopathy ?As noted above. ? ?Body mass index is 24.41 kg/m?. ? ? ?DVT prophylaxis: heparin injection 5,000 Units Start: 04/17/22 2200 ?SCDs Start: 04/12/22 0259   ?  Code Status: Full Code:  ?Family Communication: None at bedside ?Disposition:  ?Status is: Inpatient ?Remains inpatient appropriate because: EGD, colonoscopy  today.  May need for  cardiac cath. ?  ? ?Consultants:   ?Cardiology ?[facility] ? ?Procedures:   ?None ? ?Antimicrobials:   ?None ? ? ?Subjective:  ?Seen this morning.  Denied complaints.  Aware of colonic mass with possible CA diagnosis.  No chest pain, palpitations or dyspnea. ? ?Objective:  ? ?Vitals:  ? 04/17/22 0200 04/17/22 0527 04/17/22 0855 04/17/22 1443  ?BP:  106/67 110/75   ?Pulse: 66 66    ?Resp: 19 19    ?Temp:  97.9 ?F (36.6 ?C)    ?TempSrc:  Oral    ?SpO2: 95% 97%  100%  ?Weight:      ?Height:      ? ? ?General exam: Middle-age male, moderately built and nourished sitting up comfortably in bed without distress. ?Respiratory system: Clear to auscultation.  No increased work of breathing. ?Cardiovascular system: S1 & S2 heard, RRR. No JVD, murmurs, rubs, gallops or clicks. No pedal edema.   ?Gastrointestinal system: Abdomen is nondistended, soft and nontender. No organomegaly or masses felt. Normal bowel sounds heard. ?Central nervous system: Alert and oriented. No focal neurological deficits. ?Extremities: Symmetric 5 x 5 power. ?Skin: No rashes, lesions or ulcers ?Psychiatry: Judgement and insight appear normal. Mood & affect appropriate.  ? ?Data Reviewed:   ?I have personally reviewed following labs and imaging studies ? ?CBC: ?Recent Labs  ?Lab 04/11/22 ?1219 04/11/22 ?2107 04/12/22 ?1356 04/15/22 ?0102 04/16/22 ?7253 04/17/22 ?6644  ?WBC 9.0 6.9   < > 7.4 5.3 6.4  ?NEUTROABS 6.6 4.7  --   --   --   --   ?HGB 4.1* 4.0*   < > 7.6* 8.0* 7.5*  ?HCT 16.0* 16.7*   < > 26.2* 28.2* 26.8*  ?MCV 71* 76.6*   < > 78.9* 80.8 79.5*  ?PLT 353 351   < > 256 210 218  ? < > = values in this interval not displayed.  ? ?Basic Metabolic Panel: ?Recent Labs  ?Lab 04/13/22 ?0332 04/14/22 ?0358 04/15/22 ?0347 04/16/22 ?4259 04/17/22 ?5638  ?NA 137 136 135 134* 136  ?K 4.2 4.1 4.1 3.5 3.2*  ?CL 111 108 106 107 107  ?CO2 '23 23 23 '$ 20* 24  ?GLUCOSE 110* 116* 113* 104* 102*  ?BUN '12 14 11 12 9  '$ ?CREATININE 0.77 0.90 0.84 0.93  1.06  ?CALCIUM 8.0* 7.9* 7.8* 8.0* 7.8*  ?MG  --   --   --  2.0 2.1  ? ?Liver Function Tests: ?Recent Labs  ?Lab 04/11/22 ?2107  ?AST 13*  ?ALT 12  ?ALKPHOS 60  ?BILITOT 0.1*  ?PROT 6.2*  ?ALBUMIN 3.1*  ? ?CBG: ?Recent Labs  ?Lab 04/16/22 ?2125 04/17/22 ?0748 04/17/22 ?1159  ?GLUCAP 96 113* 143*  ? ?Microbiology Studies:  ?No results found for this or any previous visit (from the past 240 hour(s)). ? ?Radiology Studies:  ?CARDIAC CATHETERIZATION ? ?Result Date: 04/17/2022 ?CONCLUSIONS: Widely patent left main Mid segment of LAD contains 40 to 50% narrowing.  The large first diagonal contains a patent stent in the mid body.  Proximal to the stent is a 50% stenosis. Circumflex is totally occluded beyond the origin of the large first obtuse marginal.  Left-to-right collaterals are noted.  The proximal circumflex before the origin of the first obtuse marginal contains 50 to 70% stenosis.  There is progression of this lesion compared to prior. Right coronary is totally occluded proximally.  It reconstitutes in the distal segment.  PDA is totally occluded.  PDA and left ventricular branches fill via left to right  collaterals. Recommendations: Per managing team. Anatomy is similar to prior with the following exceptions: 50% to 70% stenosis proximal to the large first obtuse marginal.  40 to 50% stenosis proximal to the stent segment in the first diagonal. ? ?CT CHEST ABDOMEN PELVIS W CONTRAST ? ?Result Date: 04/17/2022 ?CLINICAL DATA:  61 year old male with history of malignant looking colon mass on colonoscopy today. Evaluate for metastatic disease. * Tracking Code: BO * EXAM: CT CHEST, ABDOMEN, AND PELVIS WITH CONTRAST TECHNIQUE: Multidetector CT imaging of the chest, abdomen and pelvis was performed following the standard protocol during bolus administration of intravenous contrast. RADIATION DOSE REDUCTION: This exam was performed according to the departmental dose-optimization program which includes automated exposure  control, adjustment of the mA and/or kV according to patient size and/or use of iterative reconstruction technique. CONTRAST:  161m OMNIPAQUE IOHEXOL 300 MG/ML  SOLN COMPARISON:  No priors. FINDINGS: CT

## 2022-04-17 NOTE — Progress Notes (Signed)
Pt's IV amnio infiltrated, stopped drip, removed IV and placed ice on site. IV consult in place. Informed pt to inform if pain increases. Will continue to monitor. ?

## 2022-04-17 NOTE — Plan of Care (Signed)

## 2022-04-17 NOTE — Assessment & Plan Note (Signed)
Controlled.  

## 2022-04-17 NOTE — Progress Notes (Signed)
? ? Progress Note ? ?Primary GI: Dr. Fuller Plan ? ? Subjective  ?Chief Complaint: Symptomatic anemia ? ?Bowel movement today, states he is passing small amounts of gas. ?Currently n.p.o. waiting for cardiac catheterization this afternoon.   ?Denies nausea or vomiting. ?Denies chest pain or shortness of breath. ? ? ? Objective  ? ?Vital signs in last 24 hours: ?Temp:  [97.4 ?F (36.3 ?C)-98.5 ?F (36.9 ?C)] 97.9 ?F (36.6 ?C) (05/04 5170) ?Pulse Rate:  [52-74] 66 (05/04 0527) ?Resp:  [12-21] 19 (05/04 0527) ?BP: (87-130)/(39-94) 110/75 (05/04 0855) ?SpO2:  [95 %-100 %] 97 % (05/04 0527) ?Last BM Date : 04/16/22 ?Last BM recorded by nurses in past 5 days No data recorded ? ?General:   male in no acute distress  ?Heart:  Regular rate and rhythm; no murmurs ?Pulm: Clear anteriorly; no wheezing ?Abdomen:  Soft, Obese AB, Sluggish bowel sounds. No tenderness . Without guarding and Without rebound, No organomegaly appreciated. ?Extremities:  without  edema. ?Neurologic:  Alert and  oriented x4;  No focal deficits.  ?Psych:  Cooperative. Normal mood and affect. ? ?Intake/Output from previous day: ?05/03 0701 - 05/04 0700 ?In: 1100.9 [P.O.:180; I.V.:920.9] ?Out: 1530 [[id number]] ?Intake/Output this shift: ?No intake/output data recorded. ? ?Studies/Results: ?CT CHEST ABDOMEN PELVIS W CONTRAST ? ?Result Date: 04/17/2022 ?CLINICAL DATA:  61 year old male with history of malignant looking colon mass on colonoscopy today. Evaluate for metastatic disease. * Tracking Code: BO * EXAM: CT CHEST, ABDOMEN, AND PELVIS WITH CONTRAST TECHNIQUE: Multidetector CT imaging of the chest, abdomen and pelvis was performed following the standard protocol during bolus administration of intravenous contrast. RADIATION DOSE REDUCTION: This exam was performed according to the departmental dose-optimization program which includes automated exposure control, adjustment of the mA and/or kV according to patient size and/or use of iterative reconstruction  technique. CONTRAST:  116m OMNIPAQUE IOHEXOL 300 MG/ML  SOLN COMPARISON:  No priors. FINDINGS: CT CHEST FINDINGS Cardiovascular: Heart size is mildly enlarged with left atrial dilatation. There is no significant pericardial fluid, thickening or pericardial calcification. There is aortic atherosclerosis, as well as atherosclerosis of the great vessels of the mediastinum and the coronary arteries, including calcified atherosclerotic plaque in the left main, left anterior descending, left circumflex and right coronary arteries. Mild thickening and calcification of the aortic valve. 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 Mediastinum/Nodes: No pathologically enlarged mediastinal or hilar lymph nodes. Esophagus is unremarkable in appearance. No axillary lymphadenopathy. Lungs/Pleura: Small calcified granuloma in the periphery of the left lower lobe. No other definite suspicious appearing pulmonary nodules or masses are noted. Small to moderate left pleural effusion lying dependently. No definite pleural thickening or nodular hyperenhancement. No right pleural effusion. Passive atelectasis in the dependent portion of the left lower lobe. No consolidative airspace disease. Musculoskeletal: Small subcentimeter lucent lesion in the inferior endplate of T9 with sclerotic margins on axial images, favored to represent a small Schmorl's node. There are no other aggressive appearing lytic or blastic lesions noted in the visualized portions of the skeleton. CT ABDOMEN PELVIS FINDINGS Hepatobiliary: Several subcentimeter low-attenuation lesions are noted throughout the inferior aspect of the gallbladder, too small to characterize, but statistically likely to represent tiny cysts. No other larger more suspicious appearing hepatic lesions. No intra or extrahepatic biliary ductal dilatation. 1.0 x 0.5 cm calcification lying dependently in the gallbladder is  compatible with a small calcified gallstone. Gallbladder is not distended. Gallbladder wall thickness appears normal. No pericholecystic fluid  or surrounding inflammatory changes. Pancreas: There are several hypovascular appearing masses in the pancreas, largest of which are in the body (axial image 65 of series 3 measuring 5.6 x 5.4 cm) and tail (axial image 58 of series 3 measuring 9.1 x 3.8 cm). A smaller lesion in the inferior aspect of the pancreatic head and uncinate process is also noted (axial image 78 of series 3) measuring 3.4 x 2.1 cm. No pancreatic ductal dilatation. No peripancreatic fluid inflammatory changes. Spleen: Unremarkable. Adrenals/Urinary Tract: Low-attenuation lesions in both kidneys compatible with simple cysts, largest of which measures 2.1 cm in the upper pole of the right kidney. Other subcentimeter low-attenuation lesions in both kidneys are too small to definitively characterize, but statistically likely to represent tiny cysts. No hydroureteronephrosis. Urinary bladder is unremarkable in appearance. Nodularity throughout both adrenal glands, with the largest adrenal nodules measuring up to 2.0 cm on the right (axial image 58 of series 3) and 1.9 cm on the left (axial image 63 of series 3). Stomach/Bowel: The appearance of the stomach is normal. There is no pathologic dilatation of small bowel or colon. Areas of mural thickening and mucosal hyperenhancement are noted in the colon, most evident in the ascending colon where there is a somewhat mass-like appearance within infiltrative mural lesion that is irregular in shape and therefore difficult to discretely measure, but estimated to measure approximately 6.5 x 4.4 x 7.4 cm (axial image 93 of series 3 and coronal image 47 of series 5). There is extensive stranding in the adjacent pericolonic fat suggesting local infiltration of disease. Several prominent but nonenlarged ileocolic lymph nodes are noted. Mildly enlarged ileocolic lymph  node measuring 1.4 cm in short axis (axial image 80 of series 3) also noted. Normal appendix. Vascular/Lymphatic: Aortic atherosclerosis with mild fusiform aneurysmal dilatation of the infrarenal abdominal aorta which measures up to 3.3 x 3.1 cm (axial image 85 of series 3). 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 Multiple prominent borderline enlarged and mildly enlarged retroperitoneal lymph nodes measuring up to 1.1 cm in short axis in the left para-aortic nodal station inferior to the left renal hilum (axial image 80 of series 3). Reproductive: Prostate gland and seminal vesicles are unremarkable in appearance. Other: Trace volume of ascites. Small amount of presacral edema. No pneumoperitoneum. No pneumoperitoneum. Musculoskeletal: There are no aggressive appearing lytic or blastic lesions noted in the visualized portions of the skeleton. IMPRESSION: 1. 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 2. Bilateral adrenal nodules which are indeterminate, but potentially metastatic. 3. Multiple hypovascular lesions scattered throughout the pancreas. The possibility of metastatic disease to the pancreas should be considered. Alternatively, benign etiologies such as chronic pancreatic pseudocysts could have a similar appearance (no recent prior studies are available for comparison). Close attention on follow-up studies is recommended to ensure the stability or regression of these pancreatic lesions. 4. Small to moderate left pleural effusion lying dependently with some passive subsegmental atelectasis in the left lower lobe. 5. Cardiomegaly with left atrial dilatation.  6. Aortic atherosclerosis, in addition to left main and three-vessel coronary artery disease. Please note that although the presence of coronary artery calcium documents the presence of coronary artery disea

## 2022-04-17 NOTE — Interval H&P Note (Signed)
Cath Lab Visit (complete for each Cath Lab visit) ? ?Clinical Evaluation Leading to the Procedure:  ? ?ACS: No. ? ?Non-ACS:   ? ?Anginal Classification: CCS II ? ?Anti-ischemic medical therapy: No Therapy ? ?Non-Invasive Test Results: No non-invasive testing performed ? ?Prior CABG: No previous CABG ? ? ? ? ? ?History and Physical Interval Note: ? ?04/17/2022 ?2:18 PM ? ?Gregory [name]  has presented today for surgery, with the diagnosis of vt.  The various methods of treatment have been discussed with the patient and family. After consideration of risks, benefits and other options for treatment, the patient has consented to  Procedure(s): ?LEFT HEART CATH AND CORONARY ANGIOGRAPHY (N/A) as a surgical intervention.  The patient's history has been reviewed, patient examined, no change in status, stable for surgery.  I have reviewed the patient's chart and labs.  Questions were answered to the patient's satisfaction.   ? ? ?Belva Crome III ? ? ?

## 2022-04-17 NOTE — Consult Note (Addendum)
[place]  ?[phone] [phone]  ? ?MEDICAL ONCOLOGY - INITIAL CONSULTATION ? ?Referral MD: Dr. Vernell Leep ? ?Reason for Referral: Colon mass ? ?HPI: Gregory [name] is a 61 year old male with a past medical history significant for CAD status post stenting, chronic systolic heart failure, recurrent VTE, A-fib, diabetes mellitus type 2.  He was seen recently by cardiology and had lab work done and was found to have anemia with a hemoglobin around 4 and was advised to come to the emergency department for further evaluation.  Prior to admission, he has been experiencing increased weakness and fatigue as well as shortness of breath for several weeks.  The patient was having dark stools but routinely takes iron tablets.  No prior history of colonoscopy or EGD.  Admission lab work showed a hemoglobin of 4.0, MCV 76.6, calcium 8.1, albumin 3.1, BNP 616.2, stool for occult blood positive.  He has received total of 4 units PRBCs this admission.  He was seen by GI and had an EGD performed on 04/16/2022 which showed a 2 cm hiatal hernia, normal stomach, normal duodenal bulb and second portion of the duodenum and no cause for the bleeding/anemia was found on EGD.  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  This was biopsied.  There also several polyps which were removed.  Biopsy of the mass is pending.  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  He had bilateral  adrenal nodules which were indeterminate but potentially metastatic and multiple hypovascular lesions scattered throughout the pancreas and the possibility of metastatic disease to the pancreas is a consideration.  He had a small to moderate left pleural effusion lying dependently with some passive subsegmental atelectasis in the left lower lobe.  General surgery has seen the patient and is considering him for partial colectomy pending further cardiac work-up. ? ?Today, the patient reports that he has been having some fatigue as well as dyspnea.  He has not been having any headaches, chest pain, abdominal pain, nausea, vomiting.  He has not noticed any hematochezia.  Has dark stools which he attributes to iron.  He takes stool softeners.  He reports a good appetite and has not had any recent weight loss.  No prior colonoscopy.  The patient is divorced.  He has 2 children.  He served in [employer].  Denies alcohol use.  Has a 30-pack-year history of cigarette use.  Family history significant for a father with lung cancer and brother with prostate cancer.  Medical Oncology was asked to see the patient to make recommendations regarding his colon mass.  ? ?Past Medical History:  ?Diagnosis Date  ? CAD (coronary artery disease) 12/01/2013  ? Chronic combined systolic and diastolic CHF, NYHA class 1 ([place]) 12/01/2013  ? Erectile dysfunction 12/01/2013  ? HTN (hypertension) 12/01/2013  ? Hyperlipidemia 12/01/2013  ? Ischemic cardiomyopathy 12/01/2013  ? Sleep apnea   ?: ? ? ?Past Surgical History:  ?Procedure Laterality Date  ? ACHILLES TENDON REPAIR    ? lft foot  ?  CARDIAC CATHETERIZATION N/A 05/05/2016  ? Procedure: Left Heart Cath and Coronary Angiography;  Surgeon: Leonie Man, MD;  Location: [facility];  Service: Cardiovascular;  Laterality: N/A;  ? CARDIAC DEFIBRILLATOR PLACEMENT    ? CARDIOVERSION N/A 09/13/2020  ? Procedure: CARDIOVERSION;  Surgeon: Werner Lean, MD;  Location: [facility];   Service: Cardiovascular;  Laterality: N/A;  ? CARDIOVERSION N/A 09/19/2020  ? Procedure: CARDIOVERSION;  Surgeon: [name], MD;  Location: [facility];  Service: Cardiovascular;  Laterality: N/A;  ? EP IMPLANTABLE DEVICE N/A 12/19/2016  ? Procedure: ICD Generator Changeout;  Surgeon: [name], MD;  Location: [facility];  Service: Cardiovascular;  Laterality: N/A;  ? RIGHT/LEFT HEART CATH AND CORONARY ANGIOGRAPHY N/A 09/24/2020  ? Procedure: RIGHT/LEFT HEART CATH AND CORONARY ANGIOGRAPHY;  Surgeon: Lorretta Harp, MD;  Location: [facility];  Service: Cardiovascular;  Laterality: N/A;  ? WRIST TENODESIS    ?: ? ? ?Current Facility-Administered Medications  ?Medication Dose Route Frequency Provider Last Rate Last Admin  ? acetaminophen (TYLENOL) tablet 650 mg  650 mg Oral Q6H PRN Kristopher Oppenheim, DO      ? amiodarone (NEXTERONE PREMIX) 360-4.14 MG/200ML-% (1.8 mg/mL) IV infusion  30 mg/hr Intravenous Continuous Reino Bellis B, NP 16.67 mL/hr at 04/17/22 0525 30 mg/hr at 04/17/22 0525  ? amiodarone (PACERONE) tablet 400 mg  400 mg Oral Daily Reino Bellis B, NP      ? atorvastatin (LIPITOR) tablet 80 mg  80 mg Oral QPM Rise Patience, MD   80 mg at 04/16/22 1753  ? dapagliflozin propanediol (FARXIGA) tablet 10 mg  10 mg Oral Daily Margie Billet, NP   10 mg at 04/16/22 1019  ? diphenhydrAMINE (BENADRYL) capsule 25 mg  25 mg Oral QHS PRN Kristopher Oppenheim, DO   25 mg at 04/16/22 2322  ? ezetimibe (ZETIA) tablet 10 mg  10 mg Oral Daily Rise Patience, MD   10 mg at 04/16/22 1018  ? insulin aspart (novoLOG) injection 0-6 Units  0-6 Units Subcutaneous TID WC Kristopher Oppenheim, DO      ? levETIRAcetam (KEPPRA) tablet 500 mg  500 mg Oral BID Rise Patience, MD   500 mg at 04/16/22 2125  ? melatonin tablet 10 mg  10 mg Oral QHS PRN Kristopher Oppenheim, DO   10 mg at 04/16/22 2322  ? metoprolol succinate (TOPROL-XL) 24 hr tablet 25 mg  25 mg Oral BID Rise Patience, MD   25 mg at 04/16/22 2125  ?  pantoprazole (PROTONIX) EC tablet 40 mg  40 mg Oral BID Vena Rua, PA-C   40 mg at 04/16/22 2125  ? potassium chloride SA (KLOR-CON M) CR tablet 40 mEq  40 mEq Oral Q4H Hongalgi, Lenis Dickinson, MD      ? sacubitril-valsartan (ENTRESTO) 49-51 mg per tablet  1 tablet Oral BID Rise Patience, MD   1 tablet at 04/16/22 2124  ? ? ? ?No Known Allergies: ? ? ?Family History  ?Problem Relation Age of Onset  ? Heart disease Mother   ? Brain cancer Father   ? Hypertension Daughter   ?: ? ? ?Social History  ? ?Socioeconomic History  ? Marital status: Single  ?  Spouse name: Not on file  ? Number of children: Not on file  ? Years of education: Not on file  ? Highest education level: Not on file  ?Occupational History  ? Not on file  ?Tobacco Use  ?  Smoking status: Former  ?  Packs/day: 0.00  ?  Years: 30.00  ?  Pack years: 0.00  ?  Types: Cigarettes  ?  Quit date: 05/31/2013  ?  Years since quitting: 8.8  ? Smokeless tobacco: Never  ?Vaping Use  ? Vaping Use: Never used  ?Substance and Sexual Activity  ? Alcohol use: Not Currently  ?  Comment: ocassionally  ? Drug use: Never  ? Sexual activity: Not Currently  ?Other Topics Concern  ? Not on file  ?Social History Narrative  ? Pt lies in split level home with his son, daughter and grand-son  ? Has 3 children  ? Some college education  ? Retired [employer].   ? Right handed  ? ?Social Determinants of Health  ? ?Financial Resource Strain: Not on file  ?Food Insecurity: Not on file  ?Transportation Needs: Not on file  ?Physical Activity: Not on file  ?Stress: Not on file  ?Social Connections: Not on file  ?Intimate Partner Violence: Not on file  ?: ? ?Review of Systems: A comprehensive 14 point review of systems was negative except as noted in the HPI. ? ?Exam: ?Patient Vitals for the past 24 hrs: ? BP Temp Temp src Pulse Resp SpO2 Height Weight  ?04/17/22 0527 106/67 97.9 ?F (36.6 ?C) Oral 66 19 97 % -- --  ?04/17/22 0200 -- -- -- 66 19 95 % -- --  ?04/17/22  0000 -- -- -- 74 (!) 21 96 % -- --  ?04/16/22 2100 130/80 -- -- 65 -- 100 % -- --  ?04/16/22 2042 128/81 98.5 ?F (36.9 ?C) Oral 64 18 100 % -- --  ?04/16/22 2000 121/80 -- -- 64 -- 99 % -- --  ?04/16/22

## 2022-04-17 NOTE — Progress Notes (Signed)
? ? ?  BRIEF PROGRESS NOTE ADDENDUM - POST CATH ? ?Cardiac catheterization results reviewed open personally reviewed the films and discussed with Dr. Tamala Julian). ?Overall, relatively stable findings similar to previous, but there are some moderate upstream lesions in the OM1 and D1, just upstream of the stents.  Otherwise he has known occluded distal circumflex and RCA. ? ?He does have EF 35 to 40%, but is not having any active heart failure symptoms. ? ?He does have known episodic VT controlled with amiodarone. ?Similar to plan for endoscopy procedures, would recommend converting to IV amiodarone infusion preop and during the surgery to avoid salvos of VT. ?Continue home dose of beta-blocker ?Would hold Entresto in the a.m. pre-OP, and restart postop. ? ?I have spoken with the patient about the results of his cardiac catheterization and echocardiogram.  I discussed the inherent risks of him undergoing any surgery, but the fact that he is stable CAD and no real active CHF symptoms, he is at the lowest potential risk that he could be, especially with longstanding beta-blocker and amiodarone converted to IV. ?Ammon will be available to assist postoperatively if necessary. ? ? ? ?PREOPERATIVE CARDIAC RISK ASSESSMENT  ? ?Revised Cardiac Risk Index: ?High Risk Surgery: yes; ex lap with partial colectomy ?Defined as Intraperitoneal, intrathoracic or suprainguinal vascular ?Active CAD: no; no active angina symptoms despite known CAD; he does however have intermittent runs of VT. ?CHF: yes; was having some fatigue and dyspnea although could be attributed to anemia ?Cerebrovascular Disease: no;  ?Diabetes: no; On Insulin: no ?CKD (Cr >~ 2): no; =1.06 ? ?Total: 3 ?Estimated Risk of Adverse Outcome: Class IV risk ?Estimated Risk of MI, PE, VF/VT (Cardiac Arrest), Complete Heart Block: 15 % => risk is reduced with the use of longstanding beta-blocker, also concerns of arrhythmia with restarting amiodarone. ? ? ?ACC/AHA  Guidelines for "Clearance": ?Step 1 - Need for Emergency Surgery: No: Not emergent, but relatively urgent based on obstructive colonic mass and significant GI bleed in setting of necessary anticoagulation/antiplatelet agent. ?If Yes - go straight to OR with perioperative surveillance ?Step 2 - Active Cardiac Conditions (Unstable Angina, Decompensated HF, Significant  Arrhytmias - Complete HB, Mobitz II, Symptomatic VT or SVT, Severe Aortic Stenosis - mean gradient > 40 mmHg, Valve area < 1.0 cm2):  ?No: He does have VT but not symptomatic.  He does have heart failure, not decompensated.  And no unstable angina. ?If Yes - Evaluate & Treat per ACC/AHA Guidelines -> evaluated basally with diagnostic cardiac catheterization showing stable nonobstructive existing CAD with known two-vessel occlusive disease. ->  No vascularization options of existing disease. ?Step 3 -  Low Risk Surgery: No: intraperitoneal -- HIGH RISK ?If Yes --> proceed to OR ?If No --> Step 4 ?Step 4 - Functional Capacity >= 4 METS without symptoms: Yes ?If Yes --> proceed to OR ?If No --> Step 5 ?Step 5 --  Clinical Risk Factors (CRF)  ?2-3 risk factors-evaluated with echocardiogram and now cardiac catheterization.  EF is down but no active CHF.  Known CAD but no active angina. ->  No change in management. ? ? ?Glenetta Hew, MD ?Glenetta Hew, M.D., M.S. ?Interventional Cardiologist  ? ?Pager # [phone] ?Phone # [phone] ?[address]. [address] ?[place], [place] [address] ? ? ?

## 2022-04-17 NOTE — H&P (View-Only) (Signed)
? ?Progress Note ? ?Patient Name: [facility] ?Date of Encounter: 04/17/2022 ? ?[facility] Cardiologist: Sanda Klein, MD  ? ?Subjective  ? ?No complaints this morning. Family at the bedside.  ? ?Inpatient Medications  ?  ?Scheduled Meds: ? amiodarone  400 mg Oral Daily  ? atorvastatin  80 mg Oral QPM  ? dapagliflozin propanediol  10 mg Oral Daily  ? ezetimibe  10 mg Oral Daily  ? insulin aspart  0-6 Units Subcutaneous TID WC  ? levETIRAcetam  500 mg Oral BID  ? metoprolol succinate  25 mg Oral BID  ? pantoprazole  40 mg Oral BID  ? potassium chloride  40 mEq Oral Q4H  ? sacubitril-valsartan  1 tablet Oral BID  ? ?Continuous Infusions: ? ?PRN Meds: ?acetaminophen, diphenhydrAMINE, melatonin  ? ?Vital Signs  ?  ?Vitals:  ? 04/17/22 0000 04/17/22 0200 04/17/22 0527 04/17/22 0855  ?BP:   106/67 110/75  ?Pulse: 74 66 66   ?Resp: (!) '21 19 19   '$ ?Temp:   97.9 ?F (36.6 ?C)   ?TempSrc:   Oral   ?SpO2: 96% 95% 97%   ?Weight:      ?Height:      ? ? ?Intake/Output Summary (Last 24 hours) at 04/17/2022 1032 ?Last data filed at 04/17/2022 8563 ?Gross per 24 hour  ?Intake 920.91 ml  ?Output 1530 ml  ?Net -609.09 ml  ? ? ?  04/16/2022  ? 12:23 PM 04/12/2022  ?  3:50 AM 03/21/2022  ?  3:41 PM  ?Last 3 Weights  ?Weight (lbs) 211 lb 3.2 oz 211 lb 1.6 oz 219 lb 6.4 oz  ?Weight (kg) 95.8 kg 95.754 kg 99.519 kg  ?   ? ?Telemetry  ?  ?Sinus Rhythm, intermittent paced beats - Personally Reviewed ? ?ECG  ?  ?No new tracing ? ?Physical Exam  ? ?GEN: No acute distress.   ?Neck: No JVD ?Cardiac: RRR, no murmurs, rubs, or gallops.  ?Respiratory: Clear to auscultation bilaterally. ?GI: Soft, nontender, non-distended  ?MS: No edema; No deformity. ?Neuro:  Nonfocal  ?Psych: Normal affect  ? ?Labs  ?  ?High Sensitivity Troponin:  No results for input(s): TROPONINIHS in the last 720 hours.   ?Chemistry ?Recent Labs  ?Lab 04/11/22 ?2107 04/13/22 ?0332 04/15/22 ?1497 04/16/22 ?0263 04/17/22 ?7858  ?NA 137   < > 135 134* 136  ?K 4.6   < >  4.1 3.5 3.2*  ?CL 110   < > 106 107 107  ?CO2 23   < > 23 20* 24  ?GLUCOSE 115*   < > 113* 104* 102*  ?BUN 14   < > '11 12 9  '$ ?CREATININE 1.11   < > 0.84 0.93 1.06  ?CALCIUM 8.1*   < > 7.8* 8.0* 7.8*  ?MG  --   --   --  2.0 2.1  ?PROT 6.2*  --   --   --   --   ?ALBUMIN 3.1*  --   --   --   --   ?AST 13*  --   --   --   --   ?ALT 12  --   --   --   --   ?ALKPHOS 60  --   --   --   --   ?BILITOT 0.1*  --   --   --   --   ?GFRNONAA >60   < > >60 >60 >60  ?ANIONGAP 4*   < > '6 7 5  '$ ? < > =  values in this interval not displayed.  ?  ?Lipids No results for input(s): CHOL, TRIG, HDL, LABVLDL, LDLCALC, CHOLHDL in the last 168 hours.  ?Hematology ?Recent Labs  ?Lab 04/15/22 ?7902 04/16/22 ?4097 04/17/22 ?3532  ?WBC 7.4 5.3 6.4  ?RBC 3.32* 3.49* 3.37*  ?HGB 7.6* 8.0* 7.5*  ?HCT 26.2* 28.2* 26.8*  ?MCV 78.9* 80.8 79.5*  ?MCH 22.9* 22.9* 22.3*  ?MCHC 29.0* 28.4* 28.0*  ?RDW 20.5* 21.2* 21.2*  ?PLT 256 210 218  ? ?Thyroid No results for input(s): TSH, FREET4 in the last 168 hours.  ?BNP ?Recent Labs  ?Lab 04/11/22 ?2107  ?BNP 616.2*  ?  ?DDimer No results for input(s): DDIMER in the last 168 hours.  ? ?Radiology  ?  ?CT CHEST ABDOMEN PELVIS W CONTRAST ? ?Result Date: 04/17/2022 ?CLINICAL DATA:  61 year old male with history of malignant looking colon mass on colonoscopy today. Evaluate for metastatic disease. * Tracking Code: BO * EXAM: CT CHEST, ABDOMEN, AND PELVIS WITH CONTRAST TECHNIQUE: Multidetector CT imaging of the chest, abdomen and pelvis was performed following the standard protocol during bolus administration of intravenous contrast. RADIATION DOSE REDUCTION: This exam was performed according to the departmental dose-optimization program which includes automated exposure control, adjustment of the mA and/or kV according to patient size and/or use of iterative reconstruction technique. CONTRAST:  165m OMNIPAQUE IOHEXOL 300 MG/ML  SOLN COMPARISON:  No priors. FINDINGS: CT CHEST FINDINGS Cardiovascular: Heart size is mildly  enlarged with left atrial dilatation. There is no significant pericardial fluid, thickening or pericardial calcification. There is aortic atherosclerosis, as well as atherosclerosis of the great vessels of the mediastinum and the coronary arteries, including calcified atherosclerotic plaque in the left main, left anterior descending, left circumflex and right coronary arteries. Mild thickening and calcification of the aortic valve. 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 Mediastinum/Nodes: No pathologically enlarged mediastinal or hilar lymph nodes. Esophagus is unremarkable in appearance. No axillary lymphadenopathy. Lungs/Pleura: Small calcified granuloma in the periphery of the left lower lobe. No other definite suspicious appearing pulmonary nodules or masses are noted. Small to moderate left pleural effusion lying dependently. No definite pleural thickening or nodular hyperenhancement. No right pleural effusion. Passive atelectasis in the dependent portion of the left lower lobe. No consolidative airspace disease. Musculoskeletal: Small subcentimeter lucent lesion in the inferior endplate of T9 with sclerotic margins on axial images, favored to represent a small Schmorl's node. There are no other aggressive appearing lytic or blastic lesions noted in the visualized portions of the skeleton. CT ABDOMEN PELVIS FINDINGS Hepatobiliary: Several subcentimeter low-attenuation lesions are noted throughout the inferior aspect of the gallbladder, too small to characterize, but statistically likely to represent tiny cysts. No other larger more suspicious appearing hepatic lesions. No intra or extrahepatic biliary ductal dilatation. 1.0 x 0.5 cm calcification lying dependently in the gallbladder is compatible with a small calcified gallstone. Gallbladder is not distended. Gallbladder wall thickness appears normal. No pericholecystic fluid or  surrounding inflammatory changes. Pancreas: There are several hypovascular appearing masses in the pancreas, largest of which are in the body (axial image 65 of series 3 measuring 5.6 x 5.4 cm) and tail (axial image 58 of series 3 measuring 9.1 x 3.8 cm). A smaller lesion in the inferior aspect of the pancreatic head and uncinate process is also noted (axial image 78 of series 3) measuring 3.4 x 2.1 cm. No pancreatic ductal dilatation. No peripancreatic fluid inflammatory changes. Spleen: Unremarkable. Adrenals/Urinary Tract:  Low-attenuation lesions in both kidneys compatible with simple cysts, largest of which measures 2.1 cm in the upper pole of the right kidney. Other subcentimeter low-attenuation lesions in both kidneys are too small to definitively characterize, but statistically likely to represent tiny cysts. No hydroureteronephrosis. Urinary bladder is unremarkable in appearance. Nodularity throughout both adrenal glands, with the largest adrenal nodules measuring up to 2.0 cm on the right (axial image 58 of series 3) and 1.9 cm on the left (axial image 63 of series 3). Stomach/Bowel: The appearance of the stomach is normal. There is no pathologic dilatation of small bowel or colon. Areas of mural thickening and mucosal hyperenhancement are noted in the colon, most evident in the ascending colon where there is a somewhat mass-like appearance within infiltrative mural lesion that is irregular in shape and therefore difficult to discretely measure, but estimated to measure approximately 6.5 x 4.4 x 7.4 cm (axial image 93 of series 3 and coronal image 47 of series 5). There is extensive stranding in the adjacent pericolonic fat suggesting local infiltration of disease. Several prominent but nonenlarged ileocolic lymph nodes are noted. Mildly enlarged ileocolic lymph node measuring 1.4 cm in short axis (axial image 80 of series 3) also noted. Normal appendix. Vascular/Lymphatic: Aortic atherosclerosis with mild  fusiform aneurysmal dilatation of the infrarenal abdominal aorta which measures up to 3.3 x 3.1 cm (axial image 85 of series 3). Mild aneurysmal dilatation also noted in the left common iliac artery (2.4 cm

## 2022-04-17 NOTE — Progress Notes (Addendum)
? ?Progress Note ? ?Patient Name: [facility] ?Date of Encounter: 04/17/2022 ? ?[facility] Cardiologist: Sanda Klein, MD  ? ?Subjective  ? ?No complaints this morning. Family at the bedside.  ? ?Inpatient Medications  ?  ?Scheduled Meds: ? amiodarone  400 mg Oral Daily  ? atorvastatin  80 mg Oral QPM  ? dapagliflozin propanediol  10 mg Oral Daily  ? ezetimibe  10 mg Oral Daily  ? insulin aspart  0-6 Units Subcutaneous TID WC  ? levETIRAcetam  500 mg Oral BID  ? metoprolol succinate  25 mg Oral BID  ? pantoprazole  40 mg Oral BID  ? potassium chloride  40 mEq Oral Q4H  ? sacubitril-valsartan  1 tablet Oral BID  ? ?Continuous Infusions: ? ?PRN Meds: ?acetaminophen, diphenhydrAMINE, melatonin  ? ?Vital Signs  ?  ?Vitals:  ? 04/17/22 0000 04/17/22 0200 04/17/22 0527 04/17/22 0855  ?BP:   106/67 110/75  ?Pulse: 74 66 66   ?Resp: (!) '21 19 19   '$ ?Temp:   97.9 ?F (36.6 ?C)   ?TempSrc:   Oral   ?SpO2: 96% 95% 97%   ?Weight:      ?Height:      ? ? ?Intake/Output Summary (Last 24 hours) at 04/17/2022 1032 ?Last data filed at 04/17/2022 0258 ?Gross per 24 hour  ?Intake 920.91 ml  ?Output 1530 ml  ?Net -609.09 ml  ? ? ?  04/16/2022  ? 12:23 PM 04/12/2022  ?  3:50 AM 03/21/2022  ?  3:41 PM  ?Last 3 Weights  ?Weight (lbs) 211 lb 3.2 oz 211 lb 1.6 oz 219 lb 6.4 oz  ?Weight (kg) 95.8 kg 95.754 kg 99.519 kg  ?   ? ?Telemetry  ?  ?Sinus Rhythm, intermittent paced beats - Personally Reviewed ? ?ECG  ?  ?No new tracing ? ?Physical Exam  ? ?GEN: No acute distress.   ?Neck: No JVD ?Cardiac: RRR, no murmurs, rubs, or gallops.  ?Respiratory: Clear to auscultation bilaterally. ?GI: Soft, nontender, non-distended  ?MS: No edema; No deformity. ?Neuro:  Nonfocal  ?Psych: Normal affect  ? ?Labs  ?  ?High Sensitivity Troponin:  No results for input(s): TROPONINIHS in the last 720 hours.   ?Chemistry ?Recent Labs  ?Lab 04/11/22 ?2107 04/13/22 ?0332 04/15/22 ?5277 04/16/22 ?8242 04/17/22 ?3536  ?NA 137   < > 135 134* 136  ?K 4.6   < >  4.1 3.5 3.2*  ?CL 110   < > 106 107 107  ?CO2 23   < > 23 20* 24  ?GLUCOSE 115*   < > 113* 104* 102*  ?BUN 14   < > '11 12 9  '$ ?CREATININE 1.11   < > 0.84 0.93 1.06  ?CALCIUM 8.1*   < > 7.8* 8.0* 7.8*  ?MG  --   --   --  2.0 2.1  ?PROT 6.2*  --   --   --   --   ?ALBUMIN 3.1*  --   --   --   --   ?AST 13*  --   --   --   --   ?ALT 12  --   --   --   --   ?ALKPHOS 60  --   --   --   --   ?BILITOT 0.1*  --   --   --   --   ?GFRNONAA >60   < > >60 >60 >60  ?ANIONGAP 4*   < > '6 7 5  '$ ? < > =  values in this interval not displayed.  ?  ?Lipids No results for input(s): CHOL, TRIG, HDL, LABVLDL, LDLCALC, CHOLHDL in the last 168 hours.  ?Hematology ?Recent Labs  ?Lab 04/15/22 ?3785 04/16/22 ?8850 04/17/22 ?2774  ?WBC 7.4 5.3 6.4  ?RBC 3.32* 3.49* 3.37*  ?HGB 7.6* 8.0* 7.5*  ?HCT 26.2* 28.2* 26.8*  ?MCV 78.9* 80.8 79.5*  ?MCH 22.9* 22.9* 22.3*  ?MCHC 29.0* 28.4* 28.0*  ?RDW 20.5* 21.2* 21.2*  ?PLT 256 210 218  ? ?Thyroid No results for input(s): TSH, FREET4 in the last 168 hours.  ?BNP ?Recent Labs  ?Lab 04/11/22 ?2107  ?BNP 616.2*  ?  ?DDimer No results for input(s): DDIMER in the last 168 hours.  ? ?Radiology  ?  ?CT CHEST ABDOMEN PELVIS W CONTRAST ? ?Result Date: 04/17/2022 ?CLINICAL DATA:  61 year old male with history of malignant looking colon mass on colonoscopy today. Evaluate for metastatic disease. * Tracking Code: BO * EXAM: CT CHEST, ABDOMEN, AND PELVIS WITH CONTRAST TECHNIQUE: Multidetector CT imaging of the chest, abdomen and pelvis was performed following the standard protocol during bolus administration of intravenous contrast. RADIATION DOSE REDUCTION: This exam was performed according to the departmental dose-optimization program which includes automated exposure control, adjustment of the mA and/or kV according to patient size and/or use of iterative reconstruction technique. CONTRAST:  176m OMNIPAQUE IOHEXOL 300 MG/ML  SOLN COMPARISON:  No priors. FINDINGS: CT CHEST FINDINGS Cardiovascular: Heart size is mildly  enlarged with left atrial dilatation. There is no significant pericardial fluid, thickening or pericardial calcification. There is aortic atherosclerosis, as well as atherosclerosis of the great vessels of the mediastinum and the coronary arteries, including calcified atherosclerotic plaque in the left main, left anterior descending, left circumflex and right coronary arteries. Mild thickening and calcification of the aortic valve. 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 Mediastinum/Nodes: No pathologically enlarged mediastinal or hilar lymph nodes. Esophagus is unremarkable in appearance. No axillary lymphadenopathy. Lungs/Pleura: Small calcified granuloma in the periphery of the left lower lobe. No other definite suspicious appearing pulmonary nodules or masses are noted. Small to moderate left pleural effusion lying dependently. No definite pleural thickening or nodular hyperenhancement. No right pleural effusion. Passive atelectasis in the dependent portion of the left lower lobe. No consolidative airspace disease. Musculoskeletal: Small subcentimeter lucent lesion in the inferior endplate of T9 with sclerotic margins on axial images, favored to represent a small Schmorl's node. There are no other aggressive appearing lytic or blastic lesions noted in the visualized portions of the skeleton. CT ABDOMEN PELVIS FINDINGS Hepatobiliary: Several subcentimeter low-attenuation lesions are noted throughout the inferior aspect of the gallbladder, too small to characterize, but statistically likely to represent tiny cysts. No other larger more suspicious appearing hepatic lesions. No intra or extrahepatic biliary ductal dilatation. 1.0 x 0.5 cm calcification lying dependently in the gallbladder is compatible with a small calcified gallstone. Gallbladder is not distended. Gallbladder wall thickness appears normal. No pericholecystic fluid or  surrounding inflammatory changes. Pancreas: There are several hypovascular appearing masses in the pancreas, largest of which are in the body (axial image 65 of series 3 measuring 5.6 x 5.4 cm) and tail (axial image 58 of series 3 measuring 9.1 x 3.8 cm). A smaller lesion in the inferior aspect of the pancreatic head and uncinate process is also noted (axial image 78 of series 3) measuring 3.4 x 2.1 cm. No pancreatic ductal dilatation. No peripancreatic fluid inflammatory changes. Spleen: Unremarkable. Adrenals/Urinary Tract:  Low-attenuation lesions in both kidneys compatible with simple cysts, largest of which measures 2.1 cm in the upper pole of the right kidney. Other subcentimeter low-attenuation lesions in both kidneys are too small to definitively characterize, but statistically likely to represent tiny cysts. No hydroureteronephrosis. Urinary bladder is unremarkable in appearance. Nodularity throughout both adrenal glands, with the largest adrenal nodules measuring up to 2.0 cm on the right (axial image 58 of series 3) and 1.9 cm on the left (axial image 63 of series 3). Stomach/Bowel: The appearance of the stomach is normal. There is no pathologic dilatation of small bowel or colon. Areas of mural thickening and mucosal hyperenhancement are noted in the colon, most evident in the ascending colon where there is a somewhat mass-like appearance within infiltrative mural lesion that is irregular in shape and therefore difficult to discretely measure, but estimated to measure approximately 6.5 x 4.4 x 7.4 cm (axial image 93 of series 3 and coronal image 47 of series 5). There is extensive stranding in the adjacent pericolonic fat suggesting local infiltration of disease. Several prominent but nonenlarged ileocolic lymph nodes are noted. Mildly enlarged ileocolic lymph node measuring 1.4 cm in short axis (axial image 80 of series 3) also noted. Normal appendix. Vascular/Lymphatic: Aortic atherosclerosis with mild  fusiform aneurysmal dilatation of the infrarenal abdominal aorta which measures up to 3.3 x 3.1 cm (axial image 85 of series 3). Mild aneurysmal dilatation also noted in the left common iliac artery (2.4 cm

## 2022-04-17 NOTE — Progress Notes (Signed)
Pt self administers CPAP when he is ready for night rest. ?

## 2022-04-17 NOTE — Assessment & Plan Note (Addendum)
Presented with severe symptomatic anemia. ?Work-up including colonoscopy finally revealed distal ascending versus proximal transverse colon partially obstructing mass ?CT C/A/P confirms infiltrative mass in the ascending colon concerning for primary colonic neoplasm measuring approximately 6.5 x 4.7 x 7.4 cm with evidence of local infiltration.  ?General surgery consulted and s/p hand-assisted laparoscopic right colectomy on 5/5 after cardiac clearance. 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 CEA interestingly negative at 2.5. ?Oncology follow-up appreciated.  Planning PET scan as outpatient and review of case at the GI tumor board. ?Ongoing postop ileus, confirmed on KUB, but tolerating diet.  ?Discussed with Dr. Brantley Stage, CCS, advised mobilizing, MiraLAX and minimize opioids.  Patient counseled. ?Operative pathology is pending. ? ?FINAL MICROSCOPIC DIAGNOSIS: (Colonoscopy) ? ?A. COLON MASS, BIOPSY:  ?- Moderately differentiated colonic adenocarcinoma.  ?- See comment.  ?

## 2022-04-17 NOTE — Assessment & Plan Note (Addendum)
Replaced to try to keep potassium >4.  Magnesium 2. ?Follow BMP ?

## 2022-04-18 ENCOUNTER — Inpatient Hospital Stay (HOSPITAL_COMMUNITY): Payer: Commercial Managed Care - HMO | Admitting: Anesthesiology

## 2022-04-18 ENCOUNTER — Encounter (HOSPITAL_COMMUNITY)
Admission: EM | Disposition: A | Discharge status: Home / Self Care | Payer: Self-pay | Source: Home / Self Care | Attending: Internal Medicine

## 2022-04-18 ENCOUNTER — Encounter (HOSPITAL_COMMUNITY): Payer: Self-pay | Admitting: Interventional Cardiology

## 2022-04-18 DIAGNOSIS — I509 Heart failure, unspecified: Secondary | ICD-10-CM | POA: Diagnosis not present

## 2022-04-18 DIAGNOSIS — D649 Anemia, unspecified: Secondary | ICD-10-CM | POA: Diagnosis not present

## 2022-04-18 DIAGNOSIS — M7989 Other specified soft tissue disorders: Secondary | ICD-10-CM

## 2022-04-18 DIAGNOSIS — I110 Hypertensive heart disease with heart failure: Secondary | ICD-10-CM

## 2022-04-18 DIAGNOSIS — C189 Malignant neoplasm of colon, unspecified: Secondary | ICD-10-CM | POA: Diagnosis not present

## 2022-04-18 DIAGNOSIS — K6389 Other specified diseases of intestine: Secondary | ICD-10-CM

## 2022-04-18 DIAGNOSIS — I2510 Atherosclerotic heart disease of native coronary artery without angina pectoris: Secondary | ICD-10-CM

## 2022-04-18 HISTORY — PX: COLON RESECTION: SHX5231

## 2022-04-18 LAB — CBC
HCT: 27.5 % — ABNORMAL LOW (ref 39.0–52.0)
Hemoglobin: 8 g/dL — ABNORMAL LOW (ref 13.0–17.0)
MCH: 23.1 pg — ABNORMAL LOW (ref 26.0–34.0)
MCHC: 29.1 g/dL — ABNORMAL LOW (ref 30.0–36.0)
MCV: 79.5 fL — ABNORMAL LOW (ref 80.0–100.0)
Platelets: 231 10*3/uL (ref 150–400)
RBC: 3.46 MIL/uL — ABNORMAL LOW (ref 4.22–5.81)
RDW: 19.6 % — ABNORMAL HIGH (ref 11.5–15.5)
WBC: 6.6 10*3/uL (ref 4.0–10.5)
nRBC: 0 % (ref 0.0–0.2)

## 2022-04-18 LAB — IRON AND TIBC
Iron: 15 ug/dL — ABNORMAL LOW (ref 45–182)
Saturation Ratios: 5 % — ABNORMAL LOW (ref 17.9–39.5)
TIBC: 294 ug/dL (ref 250–450)
UIBC: 279 ug/dL

## 2022-04-18 LAB — PREPARE RBC (CROSSMATCH)

## 2022-04-18 LAB — BASIC METABOLIC PANEL
Anion gap: 5 (ref 5–15)
BUN: 9 mg/dL (ref 8–23)
CO2: 23 mmol/L (ref 22–32)
Calcium: 7.7 mg/dL — ABNORMAL LOW (ref 8.9–10.3)
Chloride: 110 mmol/L (ref 98–111)
Creatinine, Ser: 0.88 mg/dL (ref 0.61–1.24)
GFR, Estimated: >60 mL/min (ref >60)
Glucose, Bld: 123 mg/dL — ABNORMAL HIGH (ref 70–99)
Potassium: 3.6 mmol/L (ref 3.5–5.1)
Sodium: 138 mmol/L (ref 135–145)

## 2022-04-18 LAB — GLUCOSE, CAPILLARY
Glucose-Capillary: 115 mg/dL — ABNORMAL HIGH (ref 70–99)
Glucose-Capillary: 364 mg/dL — ABNORMAL HIGH (ref 70–99)
Glucose-Capillary: 121 mg/dL — ABNORMAL HIGH (ref 70–99)
Glucose-Capillary: 111 mg/dL — ABNORMAL HIGH (ref 70–99)

## 2022-04-18 LAB — SURGICAL PATHOLOGY

## 2022-04-18 LAB — FERRITIN: Ferritin: 8 ng/mL — ABNORMAL LOW (ref 24–336)

## 2022-04-18 SURGERY — COLECTOMY, HAND-ASSISTED, LAPAROSCOPIC
Anesthesia: General

## 2022-04-18 MED ORDER — ROCURONIUM BROMIDE 10 MG/ML (PF) SYRINGE
PREFILLED_SYRINGE | INTRAVENOUS | Status: DC | PRN
Start: 2022-04-18 — End: 2022-04-18
  Administered 2022-04-18: 80 mg via INTRAVENOUS
  Administered 2022-04-18: 20 mg via INTRAVENOUS

## 2022-04-18 MED ORDER — LACTATED RINGERS IV SOLN
INTRAVENOUS | Status: DC | PRN
Start: 2022-04-18 — End: 2022-04-18

## 2022-04-18 MED ORDER — ONDANSETRON HCL 4 MG/2ML IJ SOLN
INTRAMUSCULAR | Status: AC
  Filled 2022-04-18: qty 2

## 2022-04-18 MED ORDER — LIDOCAINE 2% (20 MG/ML) 5 ML SYRINGE
INTRAMUSCULAR | Status: AC
  Filled 2022-04-18: qty 5

## 2022-04-18 MED ORDER — LACTATED RINGERS IV SOLN: INTRAVENOUS | Status: DC

## 2022-04-18 MED ORDER — AMIODARONE HCL IN DEXTROSE 360-4.14 MG/200ML-% IV SOLN
30.0000 mg/h | INTRAVENOUS | Status: DC
  Administered 2022-04-18 – 2022-04-19 (×4): 30 mg/h via INTRAVENOUS
  Filled 2022-04-18 (×4): qty 200

## 2022-04-18 MED ORDER — MIDAZOLAM HCL 2 MG/2ML IJ SOLN
INTRAMUSCULAR | Status: AC
  Filled 2022-04-18: qty 2

## 2022-04-18 MED ORDER — ONDANSETRON HCL 4 MG/2ML IJ SOLN
4.0000 mg | Freq: Once | INTRAMUSCULAR | Status: AC | PRN
  Administered 2022-04-18: 4 mg via INTRAVENOUS

## 2022-04-18 MED ORDER — PHENYLEPHRINE HCL-NACL 20-0.9 MG/250ML-% IV SOLN
INTRAVENOUS | Status: DC | PRN
  Administered 2022-04-18: 25 ug/min via INTRAVENOUS

## 2022-04-18 MED ORDER — PROPOFOL 10 MG/ML IV BOLUS
INTRAVENOUS | Status: DC | PRN
  Administered 2022-04-18: 150 mg via INTRAVENOUS

## 2022-04-18 MED ORDER — SPOT INK MARKER SYRINGE KIT
PACK | SUBMUCOSAL | Status: DC | PRN
  Administered 2022-04-16: 5 mL via SUBMUCOSAL

## 2022-04-18 MED ORDER — SODIUM CHLORIDE 0.9 % IV SOLN
510.0000 mg | Freq: Once | INTRAVENOUS | Status: AC
  Administered 2022-04-19: 510 mg via INTRAVENOUS
  Filled 2022-04-18: qty 17

## 2022-04-18 MED ORDER — LIDOCAINE 2% (20 MG/ML) 5 ML SYRINGE
INTRAMUSCULAR | Status: DC | PRN
  Administered 2022-04-18: 80 mg via INTRAVENOUS

## 2022-04-18 MED ORDER — HYDROMORPHONE HCL 1 MG/ML IJ SOLN
INTRAMUSCULAR | Status: AC
  Filled 2022-04-18: qty 1

## 2022-04-18 MED ORDER — MIDAZOLAM HCL 5 MG/5ML IJ SOLN
INTRAMUSCULAR | Status: DC | PRN
Start: 2022-04-18 — End: 2022-04-18
  Administered 2022-04-18: 2 mg via INTRAVENOUS

## 2022-04-18 MED ORDER — ROCURONIUM BROMIDE 10 MG/ML (PF) SYRINGE
PREFILLED_SYRINGE | INTRAVENOUS | Status: AC
  Filled 2022-04-18: qty 10

## 2022-04-18 MED ORDER — MORPHINE SULFATE (PF) 2 MG/ML IV SOLN
2.0000 mg | INTRAVENOUS | Status: DC | PRN
  Administered 2022-04-18 – 2022-04-20 (×5): 4 mg via INTRAVENOUS
  Filled 2022-04-18 (×5): qty 2

## 2022-04-18 MED ORDER — POTASSIUM CHLORIDE CRYS ER 20 MEQ PO TBCR
40.0000 meq | EXTENDED_RELEASE_TABLET | Freq: Once | ORAL | Status: AC
  Administered 2022-04-18: 40 meq via ORAL
  Filled 2022-04-18: qty 2

## 2022-04-18 MED ORDER — DEXAMETHASONE SODIUM PHOSPHATE 10 MG/ML IJ SOLN
INTRAMUSCULAR | Status: AC
  Filled 2022-04-18: qty 1

## 2022-04-18 MED ORDER — SODIUM CHLORIDE 0.9 % IV SOLN
2.0000 g | Freq: Two times a day (BID) | INTRAVENOUS | Status: DC
  Administered 2022-04-18: 2 g via INTRAVENOUS
  Filled 2022-04-18: qty 2

## 2022-04-18 MED ORDER — BUPIVACAINE HCL (PF) 0.25 % IJ SOLN
INTRAMUSCULAR | Status: AC
  Filled 2022-04-18: qty 30

## 2022-04-18 MED ORDER — ORAL CARE MOUTH RINSE: 15.0000 mL | Freq: Once | OROMUCOSAL | Status: AC

## 2022-04-18 MED ORDER — SODIUM CHLORIDE 0.9 % IR SOLN
Status: DC | PRN
  Administered 2022-04-18: 2000 mL

## 2022-04-18 MED ORDER — ALBUMIN HUMAN 5 % IV SOLN: INTRAVENOUS | Status: DC | PRN

## 2022-04-18 MED ORDER — FENTANYL CITRATE (PF) 250 MCG/5ML IJ SOLN
INTRAMUSCULAR | Status: AC
  Filled 2022-04-18: qty 5

## 2022-04-18 MED ORDER — EPHEDRINE SULFATE-NACL 50-0.9 MG/10ML-% IV SOSY
PREFILLED_SYRINGE | INTRAVENOUS | Status: DC | PRN
  Administered 2022-04-18: 5 mg via INTRAVENOUS

## 2022-04-18 MED ORDER — HEPARIN SODIUM (PORCINE) 5000 UNIT/ML IJ SOLN
5000.0000 [IU] | Freq: Three times a day (TID) | INTRAMUSCULAR | Status: AC
  Administered 2022-04-19 – 2022-04-22 (×10): 5000 [IU] via SUBCUTANEOUS
  Filled 2022-04-18 (×10): qty 1

## 2022-04-18 MED ORDER — CHLORHEXIDINE GLUCONATE 0.12 % MT SOLN
15.0000 mL | Freq: Once | OROMUCOSAL | Status: AC
  Administered 2022-04-18: 15 mL via OROMUCOSAL
  Filled 2022-04-18: qty 15

## 2022-04-18 MED ORDER — AMISULPRIDE (ANTIEMETIC) 5 MG/2ML IV SOLN: 10.0000 mg | Freq: Once | INTRAVENOUS | Status: DC | PRN

## 2022-04-18 MED ORDER — EPHEDRINE 5 MG/ML INJ
INTRAVENOUS | Status: AC
  Filled 2022-04-18: qty 5

## 2022-04-18 MED ORDER — FENTANYL CITRATE (PF) 250 MCG/5ML IJ SOLN
INTRAMUSCULAR | Status: DC | PRN
Start: 2022-04-18 — End: 2022-04-18
  Administered 2022-04-18 (×2): 50 ug via INTRAVENOUS
  Administered 2022-04-18: 100 ug via INTRAVENOUS
  Administered 2022-04-18: 50 ug via INTRAVENOUS

## 2022-04-18 MED ORDER — SUGAMMADEX SODIUM 200 MG/2ML IV SOLN
INTRAVENOUS | Status: DC | PRN
  Administered 2022-04-18: 200 mg via INTRAVENOUS

## 2022-04-18 MED ORDER — DEXAMETHASONE SODIUM PHOSPHATE 10 MG/ML IJ SOLN
INTRAMUSCULAR | Status: DC | PRN
  Administered 2022-04-18: 5 mg via INTRAVENOUS

## 2022-04-18 MED ORDER — PHENYLEPHRINE 80 MCG/ML (10ML) SYRINGE FOR IV PUSH (FOR BLOOD PRESSURE SUPPORT)
PREFILLED_SYRINGE | INTRAVENOUS | Status: AC
  Filled 2022-04-18: qty 10

## 2022-04-18 MED ORDER — PHENYLEPHRINE 80 MCG/ML (10ML) SYRINGE FOR IV PUSH (FOR BLOOD PRESSURE SUPPORT)
PREFILLED_SYRINGE | INTRAVENOUS | Status: DC | PRN
Start: 2022-04-18 — End: 2022-04-18
  Administered 2022-04-18: 160 ug via INTRAVENOUS
  Administered 2022-04-18 (×2): 80 ug via INTRAVENOUS

## 2022-04-18 MED ORDER — CHLORHEXIDINE GLUCONATE CLOTH 2 % EX PADS
6.0000 | MEDICATED_PAD | Freq: Every day | CUTANEOUS | Status: DC
  Administered 2022-04-18 – 2022-04-20 (×3): 6 via TOPICAL

## 2022-04-18 MED ORDER — SODIUM CHLORIDE 0.9 % IV SOLN: INTRAVENOUS | Status: DC | PRN

## 2022-04-18 MED ORDER — OXYCODONE HCL 5 MG/5ML PO SOLN: 5.0000 mg | Freq: Once | ORAL | Status: DC | PRN

## 2022-04-18 MED ORDER — ACETAMINOPHEN 500 MG PO TABS
1000.0000 mg | ORAL_TABLET | Freq: Once | ORAL | Status: AC
  Administered 2022-04-18: 1000 mg via ORAL
  Filled 2022-04-18: qty 2

## 2022-04-18 MED ORDER — ONDANSETRON HCL 4 MG/2ML IJ SOLN
INTRAMUSCULAR | Status: DC | PRN
  Administered 2022-04-18: 4 mg via INTRAVENOUS

## 2022-04-18 MED ORDER — OXYCODONE HCL 5 MG PO TABS: 5.0000 mg | ORAL_TABLET | Freq: Once | ORAL | Status: DC | PRN

## 2022-04-18 MED ORDER — ACETAMINOPHEN 500 MG PO TABS
1000.0000 mg | ORAL_TABLET | Freq: Four times a day (QID) | ORAL | Status: DC
Start: 2022-04-18 — End: 2022-04-25
  Administered 2022-04-18 – 2022-04-25 (×21): 1000 mg via ORAL
  Filled 2022-04-18 (×23): qty 2

## 2022-04-18 MED ORDER — HYDROMORPHONE HCL 1 MG/ML IJ SOLN
0.2500 mg | INTRAMUSCULAR | Status: DC | PRN
  Administered 2022-04-18 (×2): 0.25 mg via INTRAVENOUS
  Administered 2022-04-18 (×2): 0.5 mg via INTRAVENOUS

## 2022-04-18 MED ORDER — INSULIN ASPART 100 UNIT/ML IJ SOLN
5.0000 [IU] | Freq: Once | INTRAMUSCULAR | Status: AC
  Administered 2022-04-18: 5 [IU] via SUBCUTANEOUS

## 2022-04-18 MED ORDER — METHOCARBAMOL 500 MG PO TABS: 500.0000 mg | ORAL_TABLET | Freq: Four times a day (QID) | ORAL | Status: DC | PRN

## 2022-04-18 SURGICAL SUPPLY — 78 items
APPLIER CLIP ROT 10 11.4 M/L (STAPLE)
BAG COUNTER SPONGE SURGICOUNT (BAG) ×2 IMPLANT
BLADE CLIPPER SURG (BLADE) IMPLANT
CANISTER SUCT 3000ML PPV (MISCELLANEOUS) ×2 IMPLANT
CELLS DAT CNTRL 66122 CELL SVR (MISCELLANEOUS) IMPLANT
CLIP APPLIE ROT 10 11.4 M/L (STAPLE) IMPLANT
CLIP LIGATING HEMO LOK XL GOLD (MISCELLANEOUS) ×1 IMPLANT
COVER SURGICAL LIGHT HANDLE (MISCELLANEOUS) ×4 IMPLANT
DERMABOND ADVANCED (GAUZE/BANDAGES/DRESSINGS) ×2
DERMABOND ADVANCED .7 DNX12 (GAUZE/BANDAGES/DRESSINGS) IMPLANT
DRAPE WARM FLUID 44X44 (DRAPES) ×2 IMPLANT
DRSG OPSITE POSTOP 4X10 (GAUZE/BANDAGES/DRESSINGS) IMPLANT
DRSG OPSITE POSTOP 4X8 (GAUZE/BANDAGES/DRESSINGS) IMPLANT
ELECT CAUTERY BLADE 6.4 (BLADE) ×2 IMPLANT
ELECT REM PT RETURN 9FT ADLT (ELECTROSURGICAL) ×2
ELECTRODE REM PT RTRN 9FT ADLT (ELECTROSURGICAL) ×1 IMPLANT
GEL ULTRASOUND 20GR AQUASONIC (MISCELLANEOUS) IMPLANT
GLOVE BIO SURGEON STRL SZ7.5 (GLOVE) ×4 IMPLANT
GLOVE BIOGEL PI IND STRL 8 (GLOVE) ×2 IMPLANT
GLOVE BIOGEL PI INDICATOR 8 (GLOVE) ×2
GLOVE SURG SYN 7.5  E (GLOVE) ×4
GLOVE SURG SYN 7.5 E (GLOVE) ×2 IMPLANT
GLOVE SURG SYN 7.5 PF PI (GLOVE) ×2 IMPLANT
GOWN STRL REUS W/ TWL LRG LVL3 (GOWN DISPOSABLE) ×6 IMPLANT
GOWN STRL REUS W/ TWL XL LVL3 (GOWN DISPOSABLE) ×2 IMPLANT
GOWN STRL REUS W/TWL LRG LVL3 (GOWN DISPOSABLE) ×12
GOWN STRL REUS W/TWL XL LVL3 (GOWN DISPOSABLE) ×4
HANDLE STAPLE  ENDO EGIA 4 STD (STAPLE) ×2
HANDLE STAPLE ENDO EGIA 4 STD (STAPLE) ×1 IMPLANT
KIT SIGMOIDOSCOPE (SET/KITS/TRAYS/PACK) IMPLANT
KIT TURNOVER KIT B (KITS) ×2 IMPLANT
LIGASURE IMPACT 36 18CM CVD LR (INSTRUMENTS) IMPLANT
NDL INSUFFLATION 14GA 120MM (NEEDLE) ×1 IMPLANT
NEEDLE INSUFFLATION 14GA 120MM (NEEDLE) ×2 IMPLANT
NS IRRIG 1000ML POUR BTL (IV SOLUTION) ×4 IMPLANT
PACK COLON (CUSTOM PROCEDURE TRAY) ×2 IMPLANT
PAD ARMBOARD 7.5X6 YLW CONV (MISCELLANEOUS) ×4 IMPLANT
PENCIL SMOKE EVACUATOR (MISCELLANEOUS) ×4 IMPLANT
RELOAD EGIA 45 MED/THCK PURPLE (STAPLE) IMPLANT
RELOAD EGIA 60 MED/THCK PURPLE (STAPLE) IMPLANT
RELOAD PROXIMATE 75MM BLUE (ENDOMECHANICALS) ×8 IMPLANT
RELOAD STAPLE 60 MED/THCK ART (STAPLE) IMPLANT
RELOAD STAPLE 75 3.8 BLU REG (ENDOMECHANICALS) IMPLANT
RETRACTOR WND ALEXIS 18 MED (MISCELLANEOUS) IMPLANT
RTRCTR WOUND ALEXIS 18CM MED (MISCELLANEOUS)
SCISSORS LAP 5X35 DISP (ENDOMECHANICALS) ×2 IMPLANT
SEALER TISSUE G2 STRG ARTC 35C (ENDOMECHANICALS) ×1 IMPLANT
SET IRRIG TUBING LAPAROSCOPIC (IRRIGATION / IRRIGATOR) ×1 IMPLANT
SET TUBE SMOKE EVAC HIGH FLOW (TUBING) ×2 IMPLANT
SLEEVE ENDOPATH XCEL 5M (ENDOMECHANICALS) ×2 IMPLANT
SPECIMEN JAR LARGE (MISCELLANEOUS) ×2 IMPLANT
SPONGE T-LAP 18X18 ~~LOC~~+RFID (SPONGE) ×2 IMPLANT
STAPLER PROXIMATE 75MM BLUE (STAPLE) ×1 IMPLANT
STAPLER VISISTAT 35W (STAPLE) ×2 IMPLANT
SURGILUBE 2OZ TUBE FLIPTOP (MISCELLANEOUS) IMPLANT
SUT MNCRL AB 4-0 PS2 18 (SUTURE) ×2 IMPLANT
SUT PDS AB 1 CT  36 (SUTURE)
SUT PDS AB 1 CT 36 (SUTURE) IMPLANT
SUT PDS AB 2-0 CT1 27 (SUTURE) ×2 IMPLANT
SUT PROLENE 2 0 CT2 30 (SUTURE) IMPLANT
SUT SILK 2 0 (SUTURE) ×2
SUT SILK 2-0 18XBRD TIE 12 (SUTURE) ×1 IMPLANT
SUT SILK 3 0 (SUTURE) ×2
SUT SILK 3-0 18XBRD TIE 12 (SUTURE) ×1 IMPLANT
SUT VIC AB 1 CT1 27 (SUTURE) ×2
SUT VIC AB 1 CT1 27XBRD ANBCTR (SUTURE) ×1 IMPLANT
SUT VIC AB 2-0 SH 18 (SUTURE) ×2 IMPLANT
SUT VICRYL AB 3 0 TIES (SUTURE) ×2 IMPLANT
SYS LAPSCP GELPORT 120MM (MISCELLANEOUS) ×2
SYSTEM LAPSCP GELPORT 120MM (MISCELLANEOUS) IMPLANT
TRAY FOLEY MTR SLVR 16FR STAT (SET/KITS/TRAYS/PACK) ×2 IMPLANT
TROCAR XCEL 12X100 BLDLESS (ENDOMECHANICALS) IMPLANT
TROCAR XCEL NON-BLD 11X100MML (ENDOMECHANICALS) IMPLANT
TROCAR XCEL NON-BLD 5MMX100MML (ENDOMECHANICALS) ×2 IMPLANT
TUBE CONNECTING 12X1/4 (SUCTIONS) ×4 IMPLANT
TUBING EVAC SMOKE HEATED PNEUM (TUBING) ×2 IMPLANT
WARMER LAPAROSCOPE (MISCELLANEOUS) ×2 IMPLANT
WATER STERILE IRR 1000ML POUR (IV SOLUTION) ×2 IMPLANT

## 2022-04-18 NOTE — Progress Notes (Signed)
HEMATOLOGY-ONCOLOGY PROGRESS NOTE ? ?ASSESSMENT AND PLAN: ?1.  Partially obstructing colon mass consistent with moderately differentiated colonic adenocarcinoma ?2.  Anemia secondary to GI bleed/iron deficiency anemia ?3.  CAD with history of PCI to LAD ?4.  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  Ischemic cardiomyopathy ?6.  CHF, systolic and diastolic ?7.  Atrial fibrillation ?  ?-Discussed biopsy results from the colonoscopy with the patient.  I discussed that the biopsy confirmed malignancy.  We will await surgical pathology report which will likely return at some point next week.  We will follow-up once this is available to have further discussion regarding treatment recommendations. ?-CEA level was drawn this morning.  Results pending. ?-His CT scans showed bilateral adrenal nodules and multiple hypovascular lesions in the pancreas.  He also has prominent retroperitoneal lymphadenopathy.  These findings are suspicious for metastatic disease but not definitive.  Recommend PET scan as outpatient.  We will review his case in the GI tumor board. ?-His ferritin from this morning was low at 8.  Recommend a dose of Feraheme 510 mg IV this weekend.  We will need to repeat a dose about 1 week later. ? ?[name], DNP, AGPCNP-BC, AOCNP ? ?SUBJECTIVE: Gregory [name] has just returned from the OR at the time my visit.  He reports abdominal discomfort.  He is somewhat sleepy but awakens to answer questions and converse.  Other than pain, he offers no complaints today. ? ?REVIEW OF SYSTEMS:   ?Review of Systems  ?Constitutional: Negative.   ?HENT: Negative.    ?Respiratory: Negative.    ?Cardiovascular: Negative.   ?Gastrointestinal:   ?     Abdominal pain secondary to surgery  ?Skin: Negative.   ?Neurological: Negative.   ? ?I have reviewed the past medical history, past surgical history, social history and family history with the patient and they are unchanged from previous note. ? ? ?PHYSICAL  EXAMINATION: ?ECOG PERFORMANCE STATUS: 1 - Symptomatic but completely ambulatory ? ?Vitals:  ? 04/18/22 1350 04/18/22 1405  ?BP: 130/83 121/82  ?Pulse: 63 61  ?Resp: 14 17  ?Temp:  97.7 ?F (36.5 ?C)  ?SpO2: 98% 97%  ? ?Filed Weights  ? 04/12/22 0350 04/16/22 1223 04/18/22 0817  ?Weight: 95.8 kg 95.8 kg 95.7 kg  ? ? ?Intake/Output from previous day: ?05/04 0701 - 05/05 0700 ?In: 75 [P.O.:240; I.V.:1104.6; Blood:383.3] ?Out: 1400 [Urine:1400] ? ?Physical Exam ?Vitals reviewed.  ?Constitutional:   ?   General: He is not in acute distress. ?HENT:  ?   Head: Normocephalic.  ?Skin: ?   General: Skin is warm and dry.  ?Neurological:  ?   Mental Status: He is alert and oriented to person, place, and time.  ? ? ?LABORATORY DATA:  ?I have reviewed the data as listed ? ?  Latest Ref Rng & Units 04/18/2022  ?  1:02 AM 04/17/2022  ?  3:31 AM 04/16/2022  ?  3:18 AM  ?CMP  ?Glucose 70 - 99 mg/dL 123   102   104    ?BUN 8 - 23 mg/dL '9   9   12    '$ ?Creatinine 0.61 - 1.24 mg/dL 0.88   1.06   0.93    ?Sodium 135 - 145 mmol/L 138   136   134    ?Potassium 3.5 - 5.1 mmol/L 3.6   3.2   3.5    ?Chloride 98 - 111 mmol/L 110   107   107    ?CO2 22 - 32 mmol/L  $'23   24   20    'P$ ?Calcium 8.9 - 10.3 mg/dL 7.7   7.8   8.0    ? ? ?Lab Results  ?Component Value Date  ? WBC 6.6 04/18/2022  ? HGB 8.0 (L) 04/18/2022  ? HCT 27.5 (L) 04/18/2022  ? MCV 79.5 (L) 04/18/2022  ? PLT 231 04/18/2022  ? NEUTROABS 4.7 04/11/2022  ? ? ?No results found for: CEA1, CEA, [id number], CA125, PSA1 ? ?DG Chest 2 View ? ?Result Date: 04/11/2022 ?CLINICAL DATA:  Shortness of breath. EXAM: CHEST - 2 VIEW COMPARISON:  09/14/2020. FINDINGS: The heart size and mediastinal contours are stable. No consolidation, effusion, or pneumothorax. A calcified granuloma is present at the left lung base. Minimal atelectasis or scarring is noted at the left lung base. A multi lead pacemaker device is present over the left chest. Degenerative changes are present in the thoracic spine. No acute  osseous abnormality. IMPRESSION: Mild atelectasis or scarring at the left lung base. Electronically Signed   By: [name] M.D.   On: 04/11/2022 21:57  ? ?CARDIAC CATHETERIZATION ? ?Result Date: 04/17/2022 ?CONCLUSIONS: Widely patent left main Mid segment of LAD contains 40 to 50% narrowing.  The large first diagonal contains a patent stent in the mid body.  Proximal to the stent is a 50% stenosis. Circumflex is totally occluded beyond the origin of the large first obtuse marginal.  Left-to-right collaterals are noted.  The proximal circumflex before the origin of the first obtuse marginal contains 50 to 70% stenosis.  There is progression of this lesion compared to prior. Right coronary is totally occluded proximally.  It reconstitutes in the distal segment.  PDA is totally occluded.  PDA and left ventricular branches fill via left to right collaterals. Recommendations: Per managing team. Anatomy is similar to prior with the following exceptions: 50% to 70% stenosis proximal to the large first obtuse marginal.  40 to 50% stenosis proximal to the stent segment in the first diagonal. ? ?CT CHEST ABDOMEN PELVIS W CONTRAST ? ?Result Date: 04/17/2022 ?CLINICAL DATA:  61 year old male with history of malignant looking colon mass on colonoscopy today. Evaluate for metastatic disease. * Tracking Code: BO * EXAM: CT CHEST, ABDOMEN, AND PELVIS WITH CONTRAST TECHNIQUE: Multidetector CT imaging of the chest, abdomen and pelvis was performed following the standard protocol during bolus administration of intravenous contrast. RADIATION DOSE REDUCTION: This exam was performed according to the departmental dose-optimization program which includes automated exposure control, adjustment of the mA and/or kV according to patient size and/or use of iterative reconstruction technique. CONTRAST:  158m OMNIPAQUE IOHEXOL 300 MG/ML  SOLN COMPARISON:  No priors. FINDINGS: CT CHEST FINDINGS Cardiovascular: Heart size is mildly enlarged with  left atrial dilatation. There is no significant pericardial fluid, thickening or pericardial calcification. There is aortic atherosclerosis, as well as atherosclerosis of the great vessels of the mediastinum and the coronary arteries, including calcified atherosclerotic plaque in the left main, left anterior descending, left circumflex and right coronary arteries. Mild thickening and calcification of the aortic valve. 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 Mediastinum/Nodes: No pathologically enlarged mediastinal or hilar lymph nodes. Esophagus is unremarkable in appearance. No axillary lymphadenopathy. Lungs/Pleura: Small calcified granuloma in the periphery of the left lower lobe. No other definite suspicious appearing pulmonary nodules or masses are noted. Small to moderate left pleural effusion lying dependently. No definite pleural thickening or nodular hyperenhancement. No right pleural effusion. Passive atelectasis  in the dependent portion of the left lower lobe. No consolidative airspace disease. Musculoskeletal: Small subcentimeter lucent lesion in the inferior endplate of T9 with sclerotic margins on axial images, favored to represent a small Schmorl's node. There are no other aggressive appearing lytic or blastic lesions noted in the visualized portions of the skeleton. CT ABDOMEN PELVIS FINDINGS Hepatobiliary: Several subcentimeter low-attenuation lesions are noted throughout the inferior aspect of the gallbladder, too small to characterize, but statistically likely to represent tiny cysts. No other larger more suspicious appearing hepatic lesions. No intra or extrahepatic biliary ductal dilatation. 1.0 x 0.5 cm calcification lying dependently in the gallbladder is compatible with a small calcified gallstone. Gallbladder is not distended. Gallbladder wall thickness appears normal. No pericholecystic fluid or surrounding  inflammatory changes. Pancreas: There are several hypovascular appearing masses in the pancreas, largest of which are in the body (axial image 65 of series 3 measuring 5.6 x 5.4 cm) and tail (axial image 58 of ser

## 2022-04-18 NOTE — Anesthesia Preprocedure Evaluation (Addendum)
Anesthesia Evaluation  ?Patient identified by MRN, date of birth, ID band ?Patient awake ? ? ? ?Reviewed: ?Allergy & Precautions, NPO status , Patient's Chart, lab work & pertinent test results ? ?Airway ?Mallampati: I ? ?TM Distance: >3 FB ?Neck ROM: Full ? ? ? Dental ? ?(+) Missing, Poor Dentition,  ?Multiple missing teeth, none loose:   ?Pulmonary ?sleep apnea , former smoker,  ?  ?Pulmonary exam normal ?breath sounds clear to auscultation ? ? ? ? ? ? Cardiovascular ?hypertension, Pt. on medications ?+ CAD and +CHF  ?Normal cardiovascular exam+ pacemaker + Cardiac Defibrillator ? ?Rhythm:Regular Rate:Normal ? ?EKG: SR with 1st degree AV block ? ?04/07/22:  ??1. 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  ??2. LV function is depressed with hypokinesis of the lateral wall,  ?mid/distal inferior wall and apex; akinesis with aneurysmal dilitation of  ?inferior base. . Left ventricular ejection fraction, by estimation, is 35  ?to 40%. The left ventricle has  ?moderately decreased function. The left ventricular internal cavity size  ?was moderately dilated. There is mild left ventricular hypertrophy. Left  ?ventricular diastolic parameters are indeterminate.  ??3. Right ventricular systolic function is normal. The right ventricular  ?size is normal. There is mildly elevated pulmonary artery systolic  ?pressure.  ??4. Left atrial size was severely dilated.  ??5. Right atrial size was mildly dilated.  ??6. The mitral valve is normal in structure. Mild mitral valve  ?regurgitation.  ??7. The aortic valve is tricuspid. Aortic valve regurgitation is not  ?visualized. Aortic valve sclerosis/calcification is present, without any  ?evidence of aortic stenosis.  ??8. There is borderline dilatation of the aortic root, measuring 39 mm.  ?There is borderline dilatation of the ascending aorta, measuring 39 mm.  ?   ?Neuro/Psych ?Seizures -,  negative psych ROS  ? GI/Hepatic ?negative GI ROS, Neg liver ROS,   ?Endo/Other  ?diabetes ? Renal/GU ?negative Renal ROS  ?negative genitourinary ?  ?Musculoskeletal ?negative musculoskeletal ROS ?(+)  ? Abdominal ?  ?Peds ?negative pediatric ROS ?(+)  Hematology ? ?(+) Blood dyscrasia, anemia ,   ?Anesthesia Other Findings ? ? Reproductive/Obstetrics ?negative OB ROS ? ?  ? ? ? ? ? ? ? ? ? ? ? ? ? ?  ?  ? ? ? ? ? ? ? ?Anesthesia Physical ?Anesthesia Plan ? ?ASA: 3 ? ?Anesthesia Plan: General  ? ?Post-op Pain Management: Tylenol PO (pre-op)*  ? ?Induction: Intravenous ? ?PONV Risk Score and Plan: 2 and Treatment may vary due to age or medical condition, Ondansetron and Dexamethasone ? ?Airway Management Planned: Oral ETT ? ?Additional Equipment: Arterial line ? ?Intra-op Plan:  ? ?Post-operative Plan: Extubation in OR ? ?Informed Consent: I have reviewed the patients History and Physical, chart, labs and discussed the procedure including the risks, benefits and alternatives for the proposed anesthesia with the patient or authorized representative who has indicated his/her understanding and acceptance.  ? ? ? ?Dental advisory given ? ?Plan Discussed with: Anesthesiologist, CRNA and Surgeon ? ?Anesthesia Plan Comments: (GETA. + arterial line. Will set-up 2 units PRBCs. Norton Blizzard, MD  ? ?Per hospitalist and Cardiologist Ellyn Hack, patient needs to be on IV amiodarone gtt. The order was not placed by primary team or cardiologist and patient did not receive his morning amiodarone dose. Will give his morning po dose of '400mg'$  amiodarone and re-start his amiodarone gtt. Per cardiology the drip should continue perioperatively and overnight  with a re-start of his morning amiodarone po dose.  Magnet will be used during the procedure. At baseline per cardiology he has 5-20 beat runs of VT at times. Norton Blizzard, MD  ? ?)  ? ? ? ?Anesthesia Quick Evaluation ? ?

## 2022-04-18 NOTE — Progress Notes (Signed)
?PROGRESS NOTE ?  ?Faustino Florida  GLO:[id number]    DOB: 08/11/1961    DOA: 04/11/2022 ? ?PCP: Patient, [name]  ? ?I have briefly reviewed patients previous medical records in [facility]. ? ?Chief Complaint  ?Patient presents with  ? Abnormal Lab  ? ? ?Hospital Course:  ?61 year old male with medical history significant for CAD s/p stent, chronic systolic CHF, ventricular tachycardia, ICD placement, recurrent VTE, atrial fibrillation, type II DM, had lab work done by his cardiologist and was found to have hemoglobin in the 4 g range and was advised to come to the ED.  Admitted for severe symptomatic anemia in the absence of overt GI bleed, stool chronically dark on iron, FOBT +.  S/p EGD and colonoscopy 5/3-showed partially obstructing colonic mass suspicious for colon cancer.  General surgery and medical oncology consulted.  S/p cardiac cath 5/4.  S/p hand-assisted laparoscopic right colectomy 5/5. ? ?Assessment and Plan: ?* Colon cancer ([place]) ?Presented with severe symptomatic anemia. ?Work-up including colonoscopy finally revealed distal ascending versus proximal transverse colon partially obstructing mass ?CT C/A/P confirms infiltrative mass in the ascending colon concerning for primary colonic neoplasm measuring approximately 6.5 x 4.7 x 7.4 cm with evidence of local infiltration.  Detailed report as below. ?General surgery consulted and s/p hand-assisted laparoscopic right colectomy on 5/5 after cardiac clearance. 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  CEA pending.   ?Oncology follow-up appreciated.  Planning PET scan as outpatient and review of case at the GI tumor board. ?Medical oncology consulted as well. ?Pain not controlled on p.o. meds, added IV morphine as needed. ? ?Symptomatic anemia/iron deficiency anemia ?Longstanding history of iron deficiency anemia. ?No prior endoscopies to evaluate ?Presented with hemoglobin in the 4 g range, FOBT + in  the setting of Eliquis and Plavix. ?No history of overt GI bleed.  Stool chronically dark but on iron supplements. ?Last dose of Plavix 4/28. ?EGD and colonoscopy results as below. ?Attempt to keep hemoglobin >8.  S/p 5 units PRBC transfusion thus far.  Hemoglobin 8 g per DL prior to surgery at this morning. ?Follow CBC in AM. ?Ferritin 8, ordered a dose of IV ferritin per pharmacy. ? ?Chronic combined systolic and diastolic CHF (congestive heart failure) (HCC)/ischemic cardiomyopathy ?Cardiology team follow-up appreciated. ?Echo from 04/07/2022: LVEF 35-40%, worse compared to 09/22/2020 (LVEF 40-45%) ?Clinically euvolemic ?Continue Farxiga, metoprolol XL and Entresto. ? ?Left upper extremity swelling ?Per RN, reported IV amiodarone infiltration in the last day or 2. ?Compartments soft and neurovascular bundles intact. ?Elevate extremity and monitor closely. ? ?Hypokalemia ?Replace aggressively and follow.  Magnesium normal. ? ?Paroxysmal atrial fibrillation ([place]) ?Cardiology has switched to IV amiodarone due to frequent NSVT's. ?Continue metoprolol. ?Eliquis on hold. ? ?Seizures ([place]) ?Continue Keppra. ? ?Ventricular tachycardia ([place]) ?Recurrent VT s/p AICD ?Follows with Dr. Caryl Comes, referred to Dr. Lovena Le for ablation, awaiting cardiac cath pending GI evaluation. ?On 5/3, having frequent runs of NSVT, asymptomatic. ?Cardiology following and on IV amiodarone peri procedures including recent endoscopies and during colectomy today, as per cardiology recommendations. ? ?OSA on CPAP ?Continue CPAP. ? ?Essential hypertension ?Controlled. ? ?CAD S/P percutaneous coronary angioplasty ?LHC 09/24/2020: Known total occlusion of right coronary artery and previously placed stents were widely patent. ?No anginal symptoms. ?Plavix and Eliquis on hold in the setting of severe symptomatic anemia and will not be able to resume until definitive management of his colonic mass/cancer which is the source of bleeding. ?Underwent cardiac  cath 5/4: Per cardiology, anatomy similar  to 2018 cath except new 50% stenosis proximal to OM stent and 50% stenosis proximal to diagonal stent.  No new high-grade significant stenosis. ?. ? ?Ischemic cardiomyopathy ?As noted above. ? ?Body mass index is 24.38 kg/m?. ? ? ?DVT prophylaxis: heparin injection 5,000 Units Start: 04/19/22 0600 ?SCDs Start: 04/12/22 0259   ?  Code Status: Full Code:  ?Family Communication: None at bedside ?Disposition:  ?Status is: Inpatient ? ?  ? ?Consultants:   ?Cardiology ?[facility] ? ?Procedures:   ?As noted above ? ?Antimicrobials:   ?None ? ? ?Subjective:  ?Was not or for most of the day.  Seen now postop on the floor.  Expected postop pain, not controlled by p.o. meds.  States that his left upper extremity has been swollen for 2 days, no pain. ? ?Objective:  ? ?Vitals:  ? 04/18/22 1335 04/18/22 1350 04/18/22 1405 04/18/22 1635  ?BP: 131/85 130/83 121/82 125/85  ?Pulse: 62 63 61 60  ?Resp: '15 14 17 14  '$ ?Temp:   97.7 ?F (36.5 ?C)   ?TempSrc:      ?SpO2: 97% 98% 97% 99%  ?Weight:      ?Height:      ? ? ?General exam: Middle-age male, moderately built and nourished lying supine in bed with some painful discomfort. ?Respiratory system: Clear to auscultation.  No increased work of breathing. ?Cardiovascular system: S1 & S2 heard, RRR. No JVD, murmurs, rubs, gallops or clicks. No pedal edema.  Telemetry personally reviewed: Sinus rhythm in the 60s.  No episodes of NSVT since 5/3 morning. ?Gastrointestinal system: Midline abdominal laparoscopic site without acute findings.  Expected mild abdominal distention with tenderness without rigidity or guarding.  Diminished bowel sounds. ?Central nervous system: Alert and oriented. No focal neurological deficits. ?Extremities: Symmetric 5 x 5 power.  Left upper extremity diffusely swollen but compartments are soft and neurovascular bundle intact. ?Skin: No rashes, lesions or ulcers ?Psychiatry: Judgement and insight appear normal. Mood & affect  appropriate.  ? ?Data Reviewed:   ?I have personally reviewed following labs and imaging studies ? ?CBC: ?Recent Labs  ?Lab 04/11/22 ?2107 04/12/22 ?1356 04/16/22 ?0318 04/17/22 ?6333 04/18/22 ?0102  ?WBC 6.9   < > 5.3 6.4 6.6  ?NEUTROABS 4.7  --   --   --   --   ?HGB 4.0*   < > 8.0* 7.5* 8.0*  ?HCT 16.7*   < > 28.2* 26.8* 27.5*  ?MCV 76.6*   < > 80.8 79.5* 79.5*  ?PLT 351   < > 210 218 231  ? < > = values in this interval not displayed.  ? ?Basic Metabolic Panel: ?Recent Labs  ?Lab 04/14/22 ?0358 04/15/22 ?5456 04/16/22 ?2563 04/17/22 ?8937 04/18/22 ?0102  ?NA 136 135 134* 136 138  ?K 4.1 4.1 3.5 3.2* 3.6  ?CL 108 106 107 107 110  ?CO2 23 23 20* 24 23  ?GLUCOSE 116* 113* 104* 102* 123*  ?BUN '14 11 12 9 9  '$ ?CREATININE 0.90 0.84 0.93 1.06 0.88  ?CALCIUM 7.9* 7.8* 8.0* 7.8* 7.7*  ?MG  --   --  2.0 2.1  --   ? ?Liver Function Tests: ?Recent Labs  ?Lab 04/11/22 ?2107  ?AST 13*  ?ALT 12  ?ALKPHOS 60  ?BILITOT 0.1*  ?PROT 6.2*  ?ALBUMIN 3.1*  ? ?CBG: ?Recent Labs  ?Lab 04/18/22 ?0753 04/18/22 ?1311 04/18/22 ?1634  ?GLUCAP 115* 364* 121*  ? ?Microbiology Studies:  ? ?Recent Results (from the past 240 hour(s))  ?Surgical pcr screen  Status: None  ? Collection Time: 04/17/22  8:46 PM  ? Specimen: Nasal Mucosa; Nasal Swab  ?Result Value Ref Range Status  ? MRSA, PCR NEGATIVE NEGATIVE Final  ? Staphylococcus aureus NEGATIVE NEGATIVE Final  ?  Comment: (NOTE) ?The Xpert SA Assay (FDA approved for NASAL specimens in patients 21 ?years of age and older), is one component of a comprehensive ?surveillance program. It is not intended to diagnose infection nor to ?guide or monitor treatment. ?Performed at [facility], [place] [address], [place], [place] ?[address] ?  ? ? ?Radiology Studies:  ?CARDIAC CATHETERIZATION ? ?Result Date: 04/17/2022 ?CONCLUSIONS: Widely patent left main Mid segment of LAD contains 40 to 50% narrowing.  The large first diagonal contains a patent stent in the mid body.  Proximal to the stent is a 50%  stenosis. Circumflex is totally occluded beyond the origin of the large first obtuse marginal.  Left-to-right collaterals are noted.  The proximal circumflex before the origin of the first obtuse marginal c

## 2022-04-18 NOTE — Assessment & Plan Note (Addendum)
L Basilic vein SVT ?Per RN, reported IV amiodarone infiltration last week. ?Cordlike structure palpated on the medial aspect of the distal upper arm and proximal forearm, suspect SVT. ?Venous Doppler confirms above. ?Continue elevation of extremity. ?Improving. ?

## 2022-04-18 NOTE — Anesthesia Procedure Notes (Signed)
Procedure Name: Intubation ?Date/Time: 04/18/2022 10:03 AM ?Performed by: Colin Benton, CRNA ?Pre-anesthesia Checklist: Patient identified, Emergency Drugs available, Suction available and Patient being monitored ?Patient Re-evaluated:Patient Re-evaluated prior to induction ?Oxygen Delivery Method: Circle system utilized ?Preoxygenation: Pre-oxygenation with 100% oxygen ?Induction Type: IV induction ?Ventilation: Mask ventilation without difficulty and Oral airway inserted - appropriate to patient size ?Laryngoscope Size: Mac and 4 ?Grade View: Grade II ?Tube type: Oral ?Tube size: 7.5 mm ?Number of attempts: 1 ?Airway Equipment and Method: Stylet and Oral airway ?Placement Confirmation: ETT inserted through vocal cords under direct vision, positive ETCO2 and breath sounds checked- equal and bilateral ?Secured at: 25 cm ?Tube secured with: Tape ?Dental Injury: Teeth and Oropharynx as per pre-operative assessment  ? ? ? ? ?

## 2022-04-18 NOTE — Op Note (Signed)
? ?Patient: Gregory [name] (02/05/1961, [id number]) ? ?Date of Surgery: 04/18/22 ? ?Preoperative Diagnosis: Bleeding right colon mass ? ?Postoperative Diagnosis: Bleeding right colon mass ? ?Surgical Procedure: HAND ASSISTED LAPAROSCOPIC RIGHT COLON RESECTION: 82956 (CPT?)  ? ?Operative Team Members:  ?Surgeon(s) and Role: ?   * Hansford Hirt, Nickola Major, MD - Primary  ? ?Anesthesiologist: Merlinda Frederick, MD ?CRNA: Colin Benton, CRNA; Kyung Rudd, CRNA  ? ?Anesthesia: General  ? ?Fluids:  ?Total I/O ?In: 3580 [I.V.:2350; Blood:630; IV OZHYQMVHQ:469] ?Out: 90 [Urine:65; Blood:25] ? ?Complications: None ? ?Drains:  none  ? ?Specimen:  ?ID Type Source Tests Collected by Time Destination  ?1 : right colon Tissue Soft Tissue, Other SURGICAL PATHOLOGY Aspen Deterding, Nickola Major, MD 04/18/2022 1144   ?  ? ?Disposition:  PACU - hemodynamically stable. ? ?Plan of Care:  Continue inpatient care ? ? ? ?Indications for Procedure: Gregory [name] is a 61 y.o. male who presented with anemia, found to have a bleeding right colon mass on colonoscopy.  Biopsy was consistent with moderately differentiated colonic adenocarcinoma.  I recommended right colectomy.  Preoperative CEA is in progress.  Preoperative CT Chest/Abdomen/Pelvis visualized the mass and regional lymphadenopathy, bilateral indeterminate adrenal nodules, multiple lesions in the pancreas among other findings. ? ? ?The procedure itself as well as its risks, benefits and alternatives were discussed.  The risks discussed included but were not limited to the risk of infection, bleeding, damage to nearby structures, and anastomotic leak.  After a full discussion and all questions answered the patient granted consent to proceed. ? ?Findings: Right colon mass ? ? ?Description of Procedure:  ? ?On the date stated above patient taken operating room suite and placed in supine position.  General endotracheal anesthesia was induced.  A timeout was completed verifying  the correct patient, procedure, position, and equipment needed for the case. ? ?Incision was made at the umbilicus.  There was a tiny umbilical hernia defect.  A 5 mm trocar was placed in this position and the abdomen was insufflated to 15 mmHg.  There is no trauma the underlying viscera with initial trocar placement.  Two 5 mm trocars were placed lower in the abdomen and the initial umbilical port was upsized to a 12 mm port.  ? ?The patient was placed in left side down positioning.  The cecum was identified and the mass was noted.  The cecum was elevated and we identified the right colic artery.  The mesentery was dissected to isolate this artery.  3 Hem-o-lok clips were placed high on the right colic artery and the artery was divided leaving 2 proximal and 1 distal clip.  We then turned our attention to mobilizing medializing the right colon.  The patient was placed in headdown position as well as left side down and the appendix was elevated.  I divided the white line of Toldt lifting the right colon out of the retroperitoneum.  I was careful to stay out of the retroperitoneal structures.  This dissection was carried up to the right abdominal sidewall until the entire descending colon was mobilized and medialized.  The patient was then placed in the left side down but level positioning.  I divided the omentum off of the transverse colon.  I divided the gastrocolic ligament and worked to mobilize the hepatic flexure of the colon.  Once this dissection was complete the colon was well mobilized and the hepatic flexure was well mobilized so I decided to transition to a mini laparotomy.  The umbilical incision was extended wide enough to fit my hand into the abdomen and the colon was delivered through the mini laparotomy.  The small bowel was divided using a GIA stapler. The mesentery was divided using the bipolar instrument.  The proximal transverse colon just proximal to the middle colic artery was divided using a  GIA stapler.  With this the specimen was freed and passed off the field as a specimen labeled right colon.  A hematoma developed along the staple line of the divided transverse colon, so an additional short segment of transverse colon was resected using a second firing of the GIA stapler and passed off the field with the specimen.  I then created the anastomosis.  Enterotomies were made in the antimesenteric border of the ileum and along the tinea of the transverse colon.  The GIA 75 mm stapler was inserted and fired to create the anastomosis.  The common enterotomy was closed with a final firing of the GIA 75 mm blue stapler.  Omentum was tacked down over the staple lines using 2-0 Vicryl sutures.  The anastomosis was returned to the abdomen.  The abdomen was irrigated.  There was good hemostasis at the conclusion of the case.  We transition to a clean closure set for the abdominal closure.  The fascia in the midline was closed using running 2-0 PDS suture.  The wound was irrigated.  The deep dermal layer was closed using 2-0 Vicryl and the skin was closed using Monocryl and Dermabond.  All sponge and needle counts were correct at the end this case. ? ?At the end of the case we reviewed the infection status of the case. ?Patient: Gregory [name] Emergency General Surgery Service Patient ?Case: Urgent ?Infection Present At Time Of Surgery (PATOS):  Contamination related to creating an ileocolic anastomosis. ? ?Louanna Raw, MD ?General, Bariatric, & Minimally Invasive Surgery ?[facility], [place] ? ?

## 2022-04-18 NOTE — Progress Notes (Signed)
Bleeding colon mass.  I recommend laparoscopic, hand assisted, right colectomy for resection of this mass.  Procedure, risks, benefits and alternatives were discussed with the patient in full and the patient granted consent to proceed. ? ?Felicie Morn, MD ?General, Bariatric and Minimally Invasive Surgery ?[facility] ? ?

## 2022-04-18 NOTE — Anesthesia Procedure Notes (Signed)
Arterial Line Insertion ?Start/End5/04/2022 9:05 AM, 04/18/2022 9:10 AM ?Performed by: Wilburn Cornelia, CRNA, CRNA ? Patient location: Pre-op. ?Preanesthetic checklist: patient identified, IV checked, site marked, risks and benefits discussed, surgical consent, monitors and equipment checked, pre-op evaluation, timeout performed and anesthesia consent ?Lidocaine 1% used for infiltration ?Left, radial was placed ?Catheter size: 20 G ?Maximum sterile barriers used  ? ?Attempts: 2 ?Procedure performed without using ultrasound guided technique. ?Following insertion, Biopatch and dressing applied. ?Post procedure assessment: normal ? ?Patient tolerated the procedure well with no immediate complications. ? ? ?

## 2022-04-18 NOTE — Transfer of Care (Signed)
Immediate Anesthesia Transfer of Care Note ? ?Patient: Gregory [name] ? ?Procedure(s) Performed: HAND ASSISTED LAPAROSCOPIC RIGHT COLON RESECTION ? ?Patient Location: PACU ? ?Anesthesia Type:General ? ?Level of Consciousness: drowsy and patient cooperative ? ?Airway & Oxygen Therapy: Patient Spontanous Breathing and Patient connected to face mask oxygen ? ?Post-op Assessment: Report given to RN and Post -op Vital signs reviewed and stable ? ?Post vital signs: Reviewed and stable ? ?Last Vitals:  ?Vitals Value Taken Time  ?BP    ?Temp    ?Pulse 65 04/18/22 1255  ?Resp 15 04/18/22 1255  ?SpO2 93 % 04/18/22 1255  ?Vitals shown include unvalidated device data. ? ?Last Pain:  ?Vitals:  ? 04/18/22 0841  ?TempSrc:   ?PainSc: 0-No pain  ?   ? ?  ? ?Complications: No notable events documented. ?

## 2022-04-19 DIAGNOSIS — M7989 Other specified soft tissue disorders: Secondary | ICD-10-CM

## 2022-04-19 DIAGNOSIS — I472 Ventricular tachycardia, unspecified: Secondary | ICD-10-CM | POA: Diagnosis not present

## 2022-04-19 DIAGNOSIS — C189 Malignant neoplasm of colon, unspecified: Secondary | ICD-10-CM | POA: Diagnosis not present

## 2022-04-19 DIAGNOSIS — E876 Hypokalemia: Secondary | ICD-10-CM

## 2022-04-19 LAB — BASIC METABOLIC PANEL
Anion gap: 7 (ref 5–15)
BUN: 9 mg/dL (ref 8–23)
CO2: 21 mmol/L — ABNORMAL LOW (ref 22–32)
Calcium: 8.1 mg/dL — ABNORMAL LOW (ref 8.9–10.3)
Chloride: 109 mmol/L (ref 98–111)
Creatinine, Ser: 0.74 mg/dL (ref 0.61–1.24)
GFR, Estimated: >60 mL/min (ref >60)
Glucose, Bld: 90 mg/dL (ref 70–99)
Potassium: 4.1 mmol/L (ref 3.5–5.1)
Sodium: 137 mmol/L (ref 135–145)

## 2022-04-19 LAB — TYPE AND SCREEN
ABO/RH(D): B POS
Antibody Screen: NEGATIVE
Unit division: 0
Unit division: 0
Unit division: 0

## 2022-04-19 LAB — CBC
HCT: 35 % — ABNORMAL LOW (ref 39.0–52.0)
Hemoglobin: 10.9 g/dL — ABNORMAL LOW (ref 13.0–17.0)
MCH: 24.8 pg — ABNORMAL LOW (ref 26.0–34.0)
MCHC: 31.1 g/dL (ref 30.0–36.0)
MCV: 79.5 fL — ABNORMAL LOW (ref 80.0–100.0)
Platelets: 170 10*3/uL (ref 150–400)
RBC: 4.4 MIL/uL (ref 4.22–5.81)
RDW: 19.1 % — ABNORMAL HIGH (ref 11.5–15.5)
WBC: 9.8 10*3/uL (ref 4.0–10.5)
nRBC: 0 % (ref 0.0–0.2)

## 2022-04-19 LAB — BPAM RBC
Blood Product Expiration Date: 202305192359
Blood Product Expiration Date: 202305192359
Blood Product Expiration Date: 202305222359
ISSUE DATE / TIME: 202305041830
ISSUE DATE / TIME: 202305050943
ISSUE DATE / TIME: 202305050943
Unit Type and Rh: 7300
Unit Type and Rh: 7300
Unit Type and Rh: 7300

## 2022-04-19 LAB — GLUCOSE, CAPILLARY
Glucose-Capillary: 93 mg/dL (ref 70–99)
Glucose-Capillary: 121 mg/dL — ABNORMAL HIGH (ref 70–99)
Glucose-Capillary: 128 mg/dL — ABNORMAL HIGH (ref 70–99)
Glucose-Capillary: 114 mg/dL — ABNORMAL HIGH (ref 70–99)

## 2022-04-19 LAB — CEA: CEA: 2.5 ng/mL (ref 0.0–4.7)

## 2022-04-19 LAB — LIPOPROTEIN A (LPA): Lipoprotein (a): 278.1 nmol/L — ABNORMAL HIGH (ref <75.0)

## 2022-04-19 LAB — MAGNESIUM: Magnesium: 2 mg/dL (ref 1.7–2.4)

## 2022-04-19 NOTE — Progress Notes (Signed)
Pt self administers CPAP, states he will place CPAP on when ready. RT will continue to monitor.  ?

## 2022-04-19 NOTE — Progress Notes (Signed)
?PROGRESS NOTE ?  ?Benjimen [place]  TWS:[id number]    DOB: Jun 09, 1961    DOA: 04/11/2022 ? ?PCP: Patient, [name]  ? ?I have briefly reviewed patients previous medical records in [facility]. ? ?Chief Complaint  ?Patient presents with  ? Abnormal Lab  ? ? ?Hospital Course:  ?60 year old male with medical history significant for CAD s/p stent, chronic systolic CHF, ventricular tachycardia, ICD placement, recurrent VTE, atrial fibrillation, type II DM, had lab work done by his cardiologist and was found to have hemoglobin in the 4 g range and was advised to come to the ED.  Admitted for severe symptomatic anemia in the absence of overt GI bleed, stool chronically dark on iron, FOBT +.  S/p EGD and colonoscopy 5/3-showed partially obstructing colonic mass suspicious for colon cancer.  General surgery and medical oncology consulted.  S/p cardiac cath 5/4.  S/p hand-assisted laparoscopic right colectomy 5/5. ? ?Assessment and Plan: ?* Colon cancer ([place]) ?Presented with severe symptomatic anemia. ?Work-up including colonoscopy finally revealed distal ascending versus proximal transverse colon partially obstructing mass ?CT C/A/P confirms infiltrative mass in the ascending colon concerning for primary colonic neoplasm measuring approximately 6.5 x 4.7 x 7.4 cm with evidence of local infiltration.  Detailed report as below. ?General surgery consulted and s/p hand-assisted laparoscopic right colectomy on 5/5 after cardiac clearance. 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  CEA interestingly negative at 2.5. ?Oncology follow-up appreciated.  Planning PET scan as outpatient and review of case at the GI tumor board. ?Improving.  Advancing diet. ? ?FINAL MICROSCOPIC DIAGNOSIS:  ? ?A. COLON MASS, BIOPSY:  ?- Moderately differentiated colonic adenocarcinoma.  ?- See comment.  ? ?Symptomatic anemia/iron deficiency anemia ?Longstanding history of iron deficiency anemia. ?No prior  endoscopies to evaluate ?Presented with hemoglobin in the 4 g range, FOBT + in the setting of Eliquis and Plavix. ?No history of overt GI bleed.  Stool chronically dark but on iron supplements. ?Last dose of Plavix 4/28. ?EGD and colonoscopy results as below. ?Attempt to keep hemoglobin >8.  S/p 7 units PRBC transfusion thus far (2 units PRBC Intra-Op).  Hemoglobin up to 10.9. ?S/p Feraheme x1 dose on 5/5. ?Follow CBC daily.  Now that bleeding source has been removed, hemoglobin should stabilize. ? ?Chronic combined systolic and diastolic CHF (congestive heart failure) (HCC)/ischemic cardiomyopathy ?Cardiology team follow-up appreciated. ?Echo from 04/07/2022: LVEF 35-40%, worse compared to 09/22/2020 (LVEF 40-45%) ?Clinically euvolemic ?Continue Farxiga, metoprolol XL and Entresto. ? ?Ischemic cardiomyopathy ?As noted above. ? ?CAD S/P percutaneous coronary angioplasty ?LHC 09/24/2020: Known total occlusion of right coronary artery and previously placed stents were widely patent. ?No anginal symptoms. ?Plavix and Eliquis on hold.  Resume when cleared by general surgery. ?Underwent cardiac cath 5/4: Per cardiology, anatomy similar to 2018 cath except new 50% stenosis proximal to OM stent and 50% stenosis proximal to diagonal stent.  No new high-grade significant stenosis. ? ?Ventricular tachycardia ([place]) ?Recurrent VT s/p AICD ?Follows with Dr. Caryl Comes, referred to Dr. Lovena Le for ablation, awaiting cardiac cath pending GI evaluation. ?On 5/3, having frequent runs of NSVT, asymptomatic. ?Cardiology following and on IV amiodarone peri procedures including recent endoscopies and during colectomy, as per cardiology recommendations. ?Remains on IV amiodarone and oral amiodarone this morning.  RN communicated with cardiology team, holding oral amiodarone until cardiology follow-up today. ? ?Paroxysmal atrial fibrillation ([place]) ?Cardiology has switched to IV amiodarone due to frequent NSVT's. ?Continue metoprolol. ?Eliquis on  hold.  Eliquis to be resumed when cleared by  general surgery. ? ?Essential hypertension ?Controlled. ? ?Seizures ([place]) ?Continue Keppra. ? ?OSA on CPAP ?Continue CPAP. ? ?Hypokalemia ?Replaced.  Magnesium 2. ? ?Left upper extremity swelling ?Per RN, reported IV amiodarone infiltration in the last day or 2. ?Compartments soft and neurovascular bundles intact. ?Elevate extremity and monitor closely. ?Improving. ? ?Body mass index is 24.38 kg/m?. ? ? ?DVT prophylaxis: heparin injection 5,000 Units Start: 04/19/22 0600 ?SCDs Start: 04/12/22 0259   ?  Code Status: Full Code:  ?Family Communication: None at bedside ?Disposition:  ?Status is: Inpatient ?  ? ?Consultants:   ?Cardiology ?[facility] ? ?Procedures:   ?As noted above ? ?Antimicrobials:   ?None ? ? ?Subjective:  ?Feels better today.  Abdominal pain better controlled.  Passing flatus.  No BM.  Tolerated liquid diet.  Tells me that general surgery are advancing diet.  No other complaints. ? ?Objective:  ? ?Vitals:  ? 04/19/22 0037 04/19/22 0541 04/19/22 0853 04/19/22 1300  ?BP: 137/84 102/74 110/75 116/75  ?Pulse: 64 61 61 61  ?Resp: '17 13 14 15  '$ ?Temp: 98.1 ?F (36.7 ?C) 97.8 ?F (36.6 ?C) 97.8 ?F (36.6 ?C) 98 ?F (36.7 ?C)  ?TempSrc: Oral Oral Oral Oral  ?SpO2: 100% 95% 94% 93%  ?Weight:      ?Height:      ? ? ?General exam: Middle-age male, moderately built and nourished lying comfortably propped up in bed without distress.  Looks improved compared to yesterday. ?Respiratory system: Clear to auscultation.  No increased work of breathing. ?Cardiovascular system: S1 and S2 heard, RRR.  No JVD, murmurs or pedal edema.  Telemetry personally reviewed: Sinus rhythm in the 60s.  No recent NSVT episodes. ?Gastrointestinal system: Midline abdominal laparoscopic site without acute findings.  Mild and appropriate abdominal postop tenderness.  Some bowel sounds heard. ?Central nervous system: Alert and oriented. No focal neurological deficits. ?Extremities: Symmetric 5 x 5  power.  Left upper extremity diffusely swollen but compartments are soft and neurovascular bundle intact, improving. ?Skin: No rashes, lesions or ulcers ?Psychiatry: Judgement and insight appear normal. Mood & affect appropriate.  ? ?Data Reviewed:   ?I have personally reviewed following labs and imaging studies ? ?CBC: ?Recent Labs  ?Lab 04/17/22 ?9678 04/18/22 ?0102 04/19/22 ?9381  ?WBC 6.4 6.6 9.8  ?HGB 7.5* 8.0* 10.9*  ?HCT 26.8* 27.5* 35.0*  ?MCV 79.5* 79.5* 79.5*  ?PLT 218 231 170  ? ?Basic Metabolic Panel: ?Recent Labs  ?Lab 04/15/22 ?0175 04/16/22 ?1025 04/17/22 ?8527 04/18/22 ?0102 04/19/22 ?7824  ?NA 135 134* 136 138 137  ?K 4.1 3.5 3.2* 3.6 4.1  ?CL 106 107 107 110 109  ?CO2 23 20* 24 23 21*  ?GLUCOSE 113* 104* 102* 123* 90  ?BUN '11 12 9 9 9  '$ ?CREATININE 0.84 0.93 1.06 0.88 0.74  ?CALCIUM 7.8* 8.0* 7.8* 7.7* 8.1*  ?MG  --  2.0 2.1  --  2.0  ? ?Liver Function Tests: ?No results for input(s): AST, ALT, ALKPHOS, BILITOT, PROT, ALBUMIN in the last 168 hours. ? ?CBG: ?Recent Labs  ?Lab 04/18/22 ?2123 04/19/22 ?0740 04/19/22 ?1147  ?GLUCAP 111* 93 121*  ? ?Microbiology Studies:  ? ?Recent Results (from the past 240 hour(s))  ?Surgical pcr screen     Status: None  ? Collection Time: 04/17/22  8:46 PM  ? Specimen: Nasal Mucosa; Nasal Swab  ?Result Value Ref Range Status  ? MRSA, PCR NEGATIVE NEGATIVE Final  ? Staphylococcus aureus NEGATIVE NEGATIVE Final  ?  Comment: (NOTE) ?The Xpert SA Assay (FDA  approved for NASAL specimens in patients 58 ?years of age and older), is one component of a comprehensive ?surveillance program. It is not intended to diagnose infection nor to ?guide or monitor treatment. ?Performed at [facility], [place] [name], [place], [place] ?[address] ?  ? ? ?Radiology Studies:  ?CARDIAC CATHETERIZATION ? ?Result Date: 04/17/2022 ?CONCLUSIONS: Widely patent left main Mid segment of LAD contains 40 to 50% narrowing.  The large first diagonal contains a patent stent in the mid body.   Proximal to the stent is a 50% stenosis. Circumflex is totally occluded beyond the origin of the large first obtuse marginal.  Left-to-right collaterals are noted.  The proximal circumflex before the origin o

## 2022-04-19 NOTE — Progress Notes (Signed)
?1 Day Post-Op  ? ?Chief Complaint/Subjective: ?Peri-incisional pain, +flatus, tolerating liquids ? ?Objective: ?Vital signs in last 24 hours: ?Temp:  [97.7 ?F (36.5 ?C)-98.7 ?F (37.1 ?C)] 97.8 ?F (36.6 ?C) (05/06 0541) ?Pulse Rate:  [57-64] 61 (05/06 0541) ?Resp:  [12-19] 13 (05/06 0541) ?BP: (102-137)/(74-94) 102/74 (05/06 0541) ?SpO2:  [94 %-100 %] 95 % (05/06 0541) ?Weight:  [95.7 kg] 95.7 kg (05/05 0817) ?Last BM Date : 04/16/22 ?Intake/Output from previous day: ?05/05 0701 - 05/06 0700 ?In: 3680 [I.V.:2350; Blood:630; IV [id number]:956] ?Out: 790 [Urine:765; Blood:25] ?Intake/Output this shift: ?No intake/output data recorded. ? ?PE: ?Gen: NAd ?Resp: nonlabored ?Card: normal rate ?Abd: soft, incisions c/d/I, no ecchymosis or erythema ? ?Lab Results:  ?Recent Labs  ?  04/18/22 ?0102 04/19/22 ?2130  ?WBC 6.6 9.8  ?HGB 8.0* 10.9*  ?HCT 27.5* 35.0*  ?PLT 231 170  ? ?BMET ?Recent Labs  ?  04/18/22 ?0102 04/19/22 ?8657  ?NA 138 137  ?K 3.6 4.1  ?CL 110 109  ?CO2 23 21*  ?GLUCOSE 123* 90  ?BUN 9 9  ?CREATININE 0.88 0.74  ?CALCIUM 7.7* 8.1*  ? ?PT/INR ?No results for input(s): LABPROT, INR in the last 72 hours. ?CMP  ?   ?Component Value Date/Time  ? NA 137 04/19/2022 0358  ? NA 137 04/11/2022 1219  ? K 4.1 04/19/2022 0358  ? CL 109 04/19/2022 0358  ? CO2 21 (L) 04/19/2022 0358  ? GLUCOSE 90 04/19/2022 0358  ? BUN 9 04/19/2022 0358  ? BUN 13 04/11/2022 1219  ? CREATININE 0.74 04/19/2022 0358  ? CREATININE 1.08 12/12/2016 1445  ? CALCIUM 8.1 (L) 04/19/2022 0358  ? PROT 6.2 (L) 04/11/2022 2107  ? PROT 6.5 02/28/2022 1304  ? ALBUMIN 3.1 (L) 04/11/2022 2107  ? ALBUMIN 4.0 02/28/2022 1304  ? AST 13 (L) 04/11/2022 2107  ? ALT 12 04/11/2022 2107  ? ALKPHOS 60 04/11/2022 2107  ? BILITOT 0.1 (L) 04/11/2022 2107  ? BILITOT 0.2 02/28/2022 1304  ? GFRNONAA >60 04/19/2022 0358  ? GFRAA 91 11/15/2020 0959  ? ?Lipase  ?   ?Component Value Date/Time  ? LIPASE 120 (H) 09/09/2020 1810  ? ? ?Studies/Results: ?CARDIAC  CATHETERIZATION ? ?Result Date: 04/17/2022 ?CONCLUSIONS: Widely patent left main Mid segment of LAD contains 40 to 50% narrowing.  The large first diagonal contains a patent stent in the mid body.  Proximal to the stent is a 50% stenosis. Circumflex is totally occluded beyond the origin of the large first obtuse marginal.  Left-to-right collaterals are noted.  The proximal circumflex before the origin of the first obtuse marginal contains 50 to 70% stenosis.  There is progression of this lesion compared to prior. Right coronary is totally occluded proximally.  It reconstitutes in the distal segment.  PDA is totally occluded.  PDA and left ventricular branches fill via left to right collaterals. Recommendations: Per managing team. Anatomy is similar to prior with the following exceptions: 50% to 70% stenosis proximal to the large first obtuse marginal.  40 to 50% stenosis proximal to the stent segment in the first diagonal.  ? ?Anti-infectives: ?Anti-infectives (From admission, onward)  ? ? Start     Dose/Rate Route Frequency Ordered Stop  ? 04/18/22 1015  cefoTEtan (CEFOTAN) 2 g in sodium chloride 0.9 % 100 mL IVPB  Status:  Discontinued       ? 2 g ?200 mL/hr over 30 Minutes Intravenous Every 12 hours 04/18/22 1011 04/18/22 1416  ? ?  ? ? ?Assessment/Plan ? s/p  Procedure(s): ?HAND ASSISTED LAPAROSCOPIC RIGHT COLON RESECTION 04/18/2022  ? ?appropriate response to 2 units transfusion yesterday ?FEN - advance diet ?VTE - sq heparin ?ID - perioperative abx ?Disposition - inpatient ? ? LOS: 7 days  ? ?I reviewed last 24 h vitals and pain scores, last 48 h intake and output, last 24 h labs and trends, and last 24 h imaging results. ? ?This care required moderate level of medical decision making.  ? ?Gregory [name] ?[facility] ?04/19/2022, 8:05 AM ?Please see Amion for pager number during day hours 7:00am-4:30pm or 7:00am -11:30am on weekends ? ? ?

## 2022-04-20 DIAGNOSIS — C189 Malignant neoplasm of colon, unspecified: Secondary | ICD-10-CM | POA: Diagnosis not present

## 2022-04-20 LAB — CBC
HCT: 34.9 % — ABNORMAL LOW (ref 39.0–52.0)
Hemoglobin: 10.4 g/dL — ABNORMAL LOW (ref 13.0–17.0)
MCH: 24.3 pg — ABNORMAL LOW (ref 26.0–34.0)
MCHC: 29.8 g/dL — ABNORMAL LOW (ref 30.0–36.0)
MCV: 81.5 fL (ref 80.0–100.0)
Platelets: 234 10*3/uL (ref 150–400)
RBC: 4.28 MIL/uL (ref 4.22–5.81)
RDW: 19.9 % — ABNORMAL HIGH (ref 11.5–15.5)
WBC: 10.5 10*3/uL (ref 4.0–10.5)
nRBC: 0.2 % (ref 0.0–0.2)

## 2022-04-20 LAB — GLUCOSE, CAPILLARY
Glucose-Capillary: 97 mg/dL (ref 70–99)
Glucose-Capillary: 95 mg/dL (ref 70–99)
Glucose-Capillary: 146 mg/dL — ABNORMAL HIGH (ref 70–99)
Glucose-Capillary: 135 mg/dL — ABNORMAL HIGH (ref 70–99)

## 2022-04-20 LAB — POCT I-STAT 7, (LYTES, BLD GAS, ICA,H+H)
Acid-base deficit: 5 mmol/L — ABNORMAL HIGH (ref 0.0–2.0)
Acid-base deficit: 5 mmol/L — ABNORMAL HIGH (ref 0.0–2.0)
Bicarbonate: 20.2 mmol/L (ref 20.0–28.0)
Bicarbonate: 20.8 mmol/L (ref 20.0–28.0)
Calcium, Ion: 1.08 mmol/L — ABNORMAL LOW (ref 1.15–1.40)
Calcium, Ion: 1.09 mmol/L — ABNORMAL LOW (ref 1.15–1.40)
HCT: 22 % — ABNORMAL LOW (ref 39.0–52.0)
HCT: 32 % — ABNORMAL LOW (ref 39.0–52.0)
Hemoglobin: 7.5 g/dL — ABNORMAL LOW (ref 13.0–17.0)
Hemoglobin: 10.9 g/dL — ABNORMAL LOW (ref 13.0–17.0)
O2 Saturation: 100 %
O2 Saturation: 97 %
Potassium: 3.4 mmol/L — ABNORMAL LOW (ref 3.5–5.1)
Potassium: 4.2 mmol/L (ref 3.5–5.1)
Sodium: 142 mmol/L (ref 135–145)
Sodium: 139 mmol/L (ref 135–145)
TCO2: 21 mmol/L — ABNORMAL LOW (ref 22–32)
TCO2: 22 mmol/L (ref 22–32)
pCO2 arterial: 38.5 mmHg (ref 32–48)
pCO2 arterial: 38.9 mmHg (ref 32–48)
pH, Arterial: 7.328 — ABNORMAL LOW (ref 7.35–7.45)
pH, Arterial: 7.337 — ABNORMAL LOW (ref 7.35–7.45)
pO2, Arterial: 219 mmHg — ABNORMAL HIGH (ref 83–108)
pO2, Arterial: 97 mmHg (ref 83–108)

## 2022-04-20 LAB — MAGNESIUM: Magnesium: 2 mg/dL (ref 1.7–2.4)

## 2022-04-20 LAB — BASIC METABOLIC PANEL
Anion gap: 5 (ref 5–15)
BUN: 11 mg/dL (ref 8–23)
CO2: 22 mmol/L (ref 22–32)
Calcium: 7.9 mg/dL — ABNORMAL LOW (ref 8.9–10.3)
Chloride: 109 mmol/L (ref 98–111)
Creatinine, Ser: 0.77 mg/dL (ref 0.61–1.24)
GFR, Estimated: >60 mL/min (ref >60)
Glucose, Bld: 113 mg/dL — ABNORMAL HIGH (ref 70–99)
Potassium: 4.6 mmol/L (ref 3.5–5.1)
Sodium: 136 mmol/L (ref 135–145)

## 2022-04-20 NOTE — Progress Notes (Signed)
Pt states he places himself on cpap when he's ready but knows he can call for assistance if needed.  RT will cont to monitor. ?

## 2022-04-20 NOTE — Progress Notes (Signed)
?  2 Days Post-Op  ? ?Chief Complaint/Subjective: ?Tolerating diet, no bowel movements ? ?Objective: ?Vital signs in last 24 hours: ?Temp:  [97.7 ?F (36.5 ?C)-98.4 ?F (36.9 ?C)] 97.7 ?F (36.5 ?C) (05/07 0427) ?Pulse Rate:  [60-63] 63 (05/07 0816) ?Resp:  [12-15] 12 (05/07 0427) ?BP: (109-122)/(74-81) 119/74 (05/07 3235) ?SpO2:  [92 %-95 %] 95 % (05/07 0427) ?Last BM Date : 04/16/22 (pre-surgery) ?Intake/Output from previous day: ?05/06 0701 - 05/07 0700 ?In: 113.7 [I.V.:113.7] ?Out: 1575 [[id number]] ?Intake/Output this shift: ?No intake/output data recorded. ? ?PE: ?Gen: NAd ?Resp: nonlabored ?Card: rate controlled ?Abd: soft, incisions c/d/I, no erythema or ecchymosis ? ?Lab Results:  ?Recent Labs  ?  04/19/22 ?2706 04/20/22 ?0106  ?WBC 9.8 10.5  ?HGB 10.9* 10.4*  ?HCT 35.0* 34.9*  ?PLT 170 234  ? ?BMET ?Recent Labs  ?  04/19/22 ?0358 04/20/22 ?0106  ?NA 137 136  ?K 4.1 4.6  ?CL 109 109  ?CO2 21* 22  ?GLUCOSE 90 113*  ?BUN 9 11  ?CREATININE 0.74 0.77  ?CALCIUM 8.1* 7.9*  ? ?PT/INR ?No results for input(s): LABPROT, INR in the last 72 hours. ?CMP  ?   ?Component Value Date/Time  ? NA 136 04/20/2022 0106  ? NA 137 04/11/2022 1219  ? K 4.6 04/20/2022 0106  ? CL 109 04/20/2022 0106  ? CO2 22 04/20/2022 0106  ? GLUCOSE 113 (H) 04/20/2022 0106  ? BUN 11 04/20/2022 0106  ? BUN 13 04/11/2022 1219  ? CREATININE 0.77 04/20/2022 0106  ? CREATININE 1.08 12/12/2016 1445  ? CALCIUM 7.9 (L) 04/20/2022 0106  ? PROT 6.2 (L) 04/11/2022 2107  ? PROT 6.5 02/28/2022 1304  ? ALBUMIN 3.1 (L) 04/11/2022 2107  ? ALBUMIN 4.0 02/28/2022 1304  ? AST 13 (L) 04/11/2022 2107  ? ALT 12 04/11/2022 2107  ? ALKPHOS 60 04/11/2022 2107  ? BILITOT 0.1 (L) 04/11/2022 2107  ? BILITOT 0.2 02/28/2022 1304  ? GFRNONAA >60 04/20/2022 0106  ? GFRAA 91 11/15/2020 0959  ? ?Lipase  ?   ?Component Value Date/Time  ? LIPASE 120 (H) 09/09/2020 1810  ? ? ?Studies/Results: ?No results found. ? ?Anti-infectives: ?Anti-infectives (From admission, onward)  ? ? Start      Dose/Rate Route Frequency Ordered Stop  ? 04/18/22 1015  cefoTEtan (CEFOTAN) 2 g in sodium chloride 0.9 % 100 mL IVPB  Status:  Discontinued       ? 2 g ?200 mL/hr over 30 Minutes Intravenous Every 12 hours 04/18/22 1011 04/18/22 1416  ? ?  ? ? ?Assessment/Plan ? s/p Procedure(s): ?HAND ASSISTED LAPAROSCOPIC RIGHT COLON RESECTION 04/18/2022  ? ?FEN - carb mod diet ?VTE - heparin sq ?ID - no issues ?Disposition - inpatient for AROBF and IV amiodarone, path pending ? ? LOS: 8 days  ? ?I reviewed last 24 h vitals and pain scores, last 48 h intake and output, last 24 h labs and trends, and last 24 h imaging results. ? ?This care required high  level of medical decision making.  ? ?Gregory [name] ?[facility] ?04/20/2022, 8:34 AM ?Please see Amion for pager number during day hours 7:00am-4:30pm or 7:00am -11:30am on weekends ? ? ?

## 2022-04-20 NOTE — Progress Notes (Signed)
?PROGRESS NOTE ?  ?[place]  ZCH:[id number]    DOB: 1961/09/11    DOA: 04/11/2022 ? ?PCP: Patient, [name]  ? ?I have briefly reviewed patients previous medical records in [facility]. ? ?Chief Complaint  ?Patient presents with  ? Abnormal Lab  ? ? ?Hospital Course:  ?61 year old male with medical history significant for CAD s/p stent, chronic systolic CHF, ventricular tachycardia, ICD placement, recurrent VTE, atrial fibrillation, type II DM, had lab work done by his cardiologist and was found to have hemoglobin in the 4 g range and was advised to come to the ED.  Admitted for severe symptomatic anemia in the absence of overt GI bleed, stool chronically dark on iron, FOBT +.  S/p EGD and colonoscopy 5/3-showed partially obstructing colonic mass suspicious for colon cancer.  General surgery and medical oncology consulted.  S/p cardiac cath 5/4.  S/p hand-assisted laparoscopic right colectomy 5/5. ? ?Assessment and Plan: ?* Colon cancer ([place]) ?Presented with severe symptomatic anemia. ?Work-up including colonoscopy finally revealed distal ascending versus proximal transverse colon partially obstructing mass ?CT C/A/P confirms infiltrative mass in the ascending colon concerning for primary colonic neoplasm measuring approximately 6.5 x 4.7 x 7.4 cm with evidence of local infiltration.  Detailed report as below. ?General surgery consulted and s/p hand-assisted laparoscopic right colectomy on 5/5 after cardiac clearance. 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  CEA interestingly negative at 2.5. ?Oncology follow-up appreciated.  Planning PET scan as outpatient and review of case at the GI tumor board. ?Improving.  Advancing diet.  Still no BM or flatus since surgery.  Mobilize.  Minimize opioids.  Discussed with patient. ?Operative pathology is pending. ? ?FINAL MICROSCOPIC DIAGNOSIS: (Colonoscopy) ? ?A. COLON MASS, BIOPSY:  ?- Moderately differentiated  colonic adenocarcinoma.  ?- See comment.  ? ?Symptomatic anemia/iron deficiency anemia ?Longstanding history of iron deficiency anemia. ?No prior endoscopies to evaluate ?Presented with hemoglobin in the 4 g range, FOBT + in the setting of Eliquis and Plavix. ?No history of overt GI bleed.  Stool chronically dark but on iron supplements. ?Last dose of Plavix 4/28. ?EGD and colonoscopy results as below. ?Attempt to keep hemoglobin >8.  S/p 7 units PRBC transfusion thus far (2 units PRBC Intra-Op).  Hemoglobin up to 10.9. ?S/p Feraheme x1 dose on 5/5. ?Postop hemoglobin stable in the 10 g range. ? ?Chronic combined systolic and diastolic CHF (congestive heart failure) (HCC)/ischemic cardiomyopathy ?Cardiology team follow-up appreciated. ?Echo from 04/07/2022: LVEF 35-40%, worse compared to 09/22/2020 (LVEF 40-45%) ?Clinically euvolemic ?Continue Farxiga, metoprolol XL and Entresto. ? ?Ischemic cardiomyopathy ?As noted above. ? ?CAD S/P percutaneous coronary angioplasty ?LHC 09/24/2020: Known total occlusion of right coronary artery and previously placed stents were widely patent. ?No anginal symptoms. ?Plavix and Eliquis on hold.  Resume when cleared by general surgery. ?Underwent cardiac cath 5/4: Per cardiology, anatomy similar to 2018 cath except new 50% stenosis proximal to OM stent and 50% stenosis proximal to diagonal stent.  No new high-grade significant stenosis. ? ?Ventricular tachycardia ([place]) ?Recurrent VT s/p AICD ?Follows with Dr. Caryl Comes, referred to Dr. Lovena Le for ablation, awaiting cardiac cath pending GI evaluation. ?On 5/3, having frequent runs of NSVT, asymptomatic. ?Was on IV amiodarone peri procedures, IV amiodarone discontinued. ?Continue p.o. amiodarone 400 Mg daily. ?No further NSVT noted. ? ?Paroxysmal atrial fibrillation ([place]) ?Continue metoprolol and oral amiodarone. ?Eliquis on hold.  Eliquis to be resumed when cleared by general surgery. ? ?Essential hypertension ?Controlled. ? ?Seizures  ([place]) ?Continue Keppra. ? ?OSA  on CPAP ?Continue CPAP. ? ?Hypokalemia ?Replaced.  Magnesium 2. ? ?Left upper extremity swelling ?Per RN, reported IV amiodarone infiltration in the last day or 2. ?Compartments soft and neurovascular bundles intact. ?Elevate extremity and monitor closely. ?Continues to gradually improve ? ?Body mass index is 24.38 kg/m?. ? ? ?DVT prophylaxis: heparin injection 5,000 Units Start: 04/19/22 0600 ?SCDs Start: 04/12/22 0259   ?  Code Status: Full Code:  ?Family Communication: None at bedside ?Disposition:  ?Status is: Inpatient ?  ? ?Consultants:   ?Cardiology ?[facility] ? ?Procedures:   ?As noted above ? ?Antimicrobials:   ?None ? ? ?Subjective:  ?Abdominal pain present but gradually improving since procedure.  Reports no BM or flatus since procedure.  Tolerating diet.  Stated that he has been advised by surgeons to eat small amounts and slowly. ? ?Objective:  ? ?Vitals:  ? 04/19/22 1954 04/20/22 0427 04/20/22 0816 04/20/22 1401  ?BP: 122/78 109/81 119/74 100/70  ?Pulse: 63 60 63 68  ?Resp:  '12 14 17  '$ ?Temp: 98.4 ?F (36.9 ?C) 97.7 ?F (36.5 ?C) 97.8 ?F (36.6 ?C) 99 ?F (37.2 ?C)  ?TempSrc: Oral Oral Oral Oral  ?SpO2: 92% 95% 95% 96%  ?Weight:      ?Height:      ? ? ?General exam: Middle-age male, moderately built and nourished sitting up comfortably at edge of bed. ?Respiratory system: Clear to auscultation. ?Cardiovascular system: S1 and S2 heard, RRR.  No JVD, murmurs or pedal edema.  Telemetry personally reviewed: SB >55-SR, no recent NSVT's. ?Gastrointestinal system: Midline abdominal laparoscopic site without acute findings.  Mild and appropriate abdominal postop tenderness.  Few bowel sounds heard. ?Central nervous system: Alert and oriented. No focal neurological deficits. ?Extremities: Symmetric 5 x 5 power.  Left upper extremity diffusely swollen but compartments are soft and neurovascular bundle intact, continues to improve. ?Skin: No rashes, lesions or ulcers ?Psychiatry:  Judgement and insight appear normal. Mood & affect appropriate.  ? ?Data Reviewed:   ?I have personally reviewed following labs and imaging studies ? ?CBC: ?Recent Labs  ?Lab 04/18/22 ?0102 04/18/22 ?1100 04/18/22 ?1218 04/19/22 ?0358 04/20/22 ?0106  ?WBC 6.6  --   --  9.8 10.5  ?HGB 8.0*   < > 10.9* 10.9* 10.4*  ?HCT 27.5*   < > 32.0* 35.0* 34.9*  ?MCV 79.5*  --   --  79.5* 81.5  ?PLT 231  --   --  170 234  ? < > = values in this interval not displayed.  ? ?Basic Metabolic Panel: ?Recent Labs  ?Lab 04/16/22 ?0318 04/17/22 ?5053 04/18/22 ?0102 04/18/22 ?1100 04/18/22 ?1218 04/19/22 ?0358 04/20/22 ?0106  ?NA 134* 136 138 142 139 137 136  ?K 3.5 3.2* 3.6 3.4* 4.2 4.1 4.6  ?CL 107 107 110  --   --  109 109  ?CO2 20* 24 23  --   --  21* 22  ?GLUCOSE 104* 102* 123*  --   --  90 113*  ?BUN '12 9 9  '$ --   --  9 11  ?CREATININE 0.93 1.06 0.88  --   --  0.74 0.77  ?CALCIUM 8.0* 7.8* 7.7*  --   --  8.1* 7.9*  ?MG 2.0 2.1  --   --   --  2.0 2.0  ? ?Liver Function Tests: ?No results for input(s): AST, ALT, ALKPHOS, BILITOT, PROT, ALBUMIN in the last 168 hours. ? ?CBG: ?Recent Labs  ?Lab 04/19/22 ?2108 04/20/22 ?0719 04/20/22 ?1125  ?GLUCAP 114* 97 95  ? ?  Microbiology Studies:  ? ?Recent Results (from the past 240 hour(s))  ?Surgical pcr screen     Status: None  ? Collection Time: 04/17/22  8:46 PM  ? Specimen: Nasal Mucosa; Nasal Swab  ?Result Value Ref Range Status  ? MRSA, PCR NEGATIVE NEGATIVE Final  ? Staphylococcus aureus NEGATIVE NEGATIVE Final  ?  Comment: (NOTE) ?The Xpert SA Assay (FDA approved for NASAL specimens in patients 52 ?years of age and older), is one component of a comprehensive ?surveillance program. It is not intended to diagnose infection nor to ?guide or monitor treatment. ?Performed at [facility], [place] [address], [place], [place] ?[address] ?  ? ? ?Radiology Studies:  ?No results found. ? ?Scheduled Meds:  ? ? acetaminophen  1,000 mg Oral Q6H  ? amiodarone  400 mg Oral Daily  ? atorvastatin  80 mg  Oral QPM  ? Chlorhexidine Gluconate Cloth  6 each Topical Daily  ? dapagliflozin propanediol  10 mg Oral Daily  ? ezetimibe  10 mg Oral Daily  ? heparin  5,000 Units Subcutaneous Q8H  ? insulin aspart  0-6 Units Subc

## 2022-04-20 NOTE — Progress Notes (Addendum)
? ?  Reviewed with Dr. Domenic Polite.  Notes from Dr. Lovena Le reviewed.  Pt was on high dose Amio 400 mg twice daily previously due to recurrent VT.  Plan previously was to change from IV Amio to Amio po 400 mg once daily.   ? ?PLAN:  ?Will DC IV Amiodarone ?Restart Amiodarone 400 mg once daily.  ? ?Richardson Dopp, PA-C    ?04/20/2022 7:36 AM   ? ?Satira Sark, M.D., F.A.C.C. ? ?

## 2022-04-21 ENCOUNTER — Inpatient Hospital Stay (HOSPITAL_COMMUNITY): Payer: Commercial Managed Care - HMO

## 2022-04-21 ENCOUNTER — Encounter (HOSPITAL_COMMUNITY): Payer: Self-pay | Admitting: Surgery

## 2022-04-21 DIAGNOSIS — I480 Paroxysmal atrial fibrillation: Secondary | ICD-10-CM | POA: Diagnosis not present

## 2022-04-21 DIAGNOSIS — I2510 Atherosclerotic heart disease of native coronary artery without angina pectoris: Secondary | ICD-10-CM | POA: Diagnosis not present

## 2022-04-21 DIAGNOSIS — I5042 Chronic combined systolic (congestive) and diastolic (congestive) heart failure: Secondary | ICD-10-CM

## 2022-04-21 DIAGNOSIS — K567 Ileus, unspecified: Secondary | ICD-10-CM

## 2022-04-21 DIAGNOSIS — I255 Ischemic cardiomyopathy: Secondary | ICD-10-CM | POA: Diagnosis not present

## 2022-04-21 DIAGNOSIS — M7989 Other specified soft tissue disorders: Secondary | ICD-10-CM

## 2022-04-21 DIAGNOSIS — C189 Malignant neoplasm of colon, unspecified: Secondary | ICD-10-CM | POA: Diagnosis not present

## 2022-04-21 LAB — GLUCOSE, CAPILLARY
Glucose-Capillary: 100 mg/dL — ABNORMAL HIGH (ref 70–99)
Glucose-Capillary: 130 mg/dL — ABNORMAL HIGH (ref 70–99)
Glucose-Capillary: 106 mg/dL — ABNORMAL HIGH (ref 70–99)

## 2022-04-21 LAB — COMPREHENSIVE METABOLIC PANEL
ALT: 53 U/L — ABNORMAL HIGH (ref 0–44)
AST: 28 U/L (ref 15–41)
Albumin: 2.3 g/dL — ABNORMAL LOW (ref 3.5–5.0)
Alkaline Phosphatase: 43 U/L (ref 38–126)
Anion gap: 6 (ref 5–15)
BUN: 8 mg/dL (ref 8–23)
CO2: 26 mmol/L (ref 22–32)
Calcium: 8.2 mg/dL — ABNORMAL LOW (ref 8.9–10.3)
Chloride: 107 mmol/L (ref 98–111)
Creatinine, Ser: 0.81 mg/dL (ref 0.61–1.24)
GFR, Estimated: >60 mL/min (ref >60)
Glucose, Bld: 101 mg/dL — ABNORMAL HIGH (ref 70–99)
Potassium: 4.2 mmol/L (ref 3.5–5.1)
Sodium: 139 mmol/L (ref 135–145)
Total Bilirubin: 1 mg/dL (ref 0.3–1.2)
Total Protein: 5.7 g/dL — ABNORMAL LOW (ref 6.5–8.1)

## 2022-04-21 MED ORDER — POLYETHYLENE GLYCOL 3350 17 G PO PACK
17.0000 g | PACK | Freq: Two times a day (BID) | ORAL | Status: DC
  Administered 2022-04-21 – 2022-04-24 (×7): 17 g via ORAL
  Filled 2022-04-21 (×9): qty 1

## 2022-04-21 MED ORDER — APIXABAN 5 MG PO TABS
5.0000 mg | ORAL_TABLET | Freq: Two times a day (BID) | ORAL | Status: DC
  Administered 2022-04-22 – 2022-04-25 (×7): 5 mg via ORAL
  Filled 2022-04-21 (×7): qty 1

## 2022-04-21 NOTE — Progress Notes (Signed)
?PROGRESS NOTE ?  ?Gregory [name]  NAT:[id number]    DOB: 06-May-1961    DOA: 04/11/2022 ? ?PCP: Patient, [name]  ? ?I have briefly reviewed patients previous medical records in [facility]. ? ?Chief Complaint  ?Patient presents with  ? Abnormal Lab  ? ? ?Hospital Course:  ?61 year old male with medical history significant for CAD s/p stent, chronic systolic CHF, ventricular tachycardia, ICD placement, recurrent VTE, atrial fibrillation, type II DM, had lab work done by his cardiologist and was found to have hemoglobin in the 4 g range and was advised to come to the ED.  Admitted for severe symptomatic anemia in the absence of overt GI bleed, stool chronically dark on iron, FOBT +.  S/p EGD and colonoscopy 5/3-showed partially obstructing colonic mass suspicious for colon cancer.  General surgery and medical oncology consulted.  S/p cardiac cath 5/4.  S/p hand-assisted laparoscopic right colectomy 5/5.  Ongoing postop ileus. ? ?Assessment and Plan: ?* Colon cancer ([place]) ?Presented with severe symptomatic anemia. ?Work-up including colonoscopy finally revealed distal ascending versus proximal transverse colon partially obstructing mass ?CT C/A/P confirms infiltrative mass in the ascending colon concerning for primary colonic neoplasm measuring approximately 6.5 x 4.7 x 7.4 cm with evidence of local infiltration.  ?General surgery consulted and s/p hand-assisted laparoscopic right colectomy on 5/5 after cardiac clearance. 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 CEA interestingly negative at 2.5. ?Oncology follow-up appreciated.  Planning PET scan as outpatient and review of case at the GI tumor board. ?Ongoing postop ileus, confirmed on KUB, but tolerating diet.  ?Discussed with Dr. Brantley Stage, CCS, advised mobilizing, MiraLAX and minimize opioids.  Patient counseled. ?Operative pathology is pending. ? ?FINAL MICROSCOPIC DIAGNOSIS: (Colonoscopy) ? ?A.  COLON MASS, BIOPSY:  ?- Moderately differentiated colonic adenocarcinoma.  ?- See comment.  ? ?Symptomatic anemia/iron deficiency anemia ?Longstanding history of iron deficiency anemia. ?No prior endoscopies to evaluate ?Presented with hemoglobin in the 4 g range, FOBT + in the setting of Eliquis and Plavix. ?No history of overt GI bleed.  Stool chronically dark but on iron supplements. ?EGD and colonoscopy done.  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 ?S/p Feraheme x1 dose on 5/5. ?S/p 7 units PRBC transfusion this admission.  Hemoglobin stable and 10 g range postop. ? ?Chronic combined systolic and diastolic CHF (congestive heart failure) (HCC)/ischemic cardiomyopathy ?Cardiology team follow-up appreciated. ?Echo from 04/07/2022: LVEF 35-40%, worse compared to 09/22/2020 (LVEF 40-45%) ?Clinically euvolemic ?Continue Farxiga, metoprolol XL and Entresto. ? ?CAD S/P percutaneous coronary angioplasty ?LHC 09/24/2020: Known total occlusion of right coronary artery and previously placed stents were widely patent. ?No anginal symptoms. ?Plavix on hold.  Resume when cleared by general surgery. ?Underwent cardiac cath 5/4: Per cardiology, anatomy similar to 2018 cath except new 50% stenosis proximal to OM stent and 50% stenosis proximal to diagonal stent.  No new high-grade significant stenosis. ?Continue beta-blockers, statins and Zetia. ? ?Ventricular tachycardia ([place]) ?Recurrent VT s/p AICD ?Follows with Dr. Caryl Comes, referred to Dr. Lovena Le for ablation, can follow-up as outpatient. ?On 5/3, having frequent runs of NSVT, asymptomatic. ?Was on IV amiodarone peri procedures, IV amiodarone discontinued. ?Continue p.o. amiodarone 400 Mg daily. ?No further NSVT noted. ? ?Paroxysmal atrial fibrillation ([place]) ?Continue metoprolol and oral amiodarone. ?Eliquis on hold.  Eliquis to be resumed when cleared by general surgery. ? ?Essential hypertension ?Controlled. ? ?Seizures  ([place]) ?Continue Keppra. ? ?OSA on CPAP ?Continue CPAP. ? ?Hypokalemia ?Replaced.  Magnesium 2. ? ?Left upper  extremity swelling ?Per RN, reported IV amiodarone infiltration last week. ?Gradually improving. ?Cordlike structure palpated on the medial aspect of the distal upper arm and proximal forearm, suspect SVT. ?Check venous Doppler. ?Continue elevation of extremity. ? ?Body mass index is 24.38 kg/m?. ? ? ?DVT prophylaxis: heparin injection 5,000 Units Start: 04/19/22 0600 ?SCDs Start: 04/12/22 0259   ?  Code Status: Full Code:  ?Family Communication: None at bedside ?Disposition:  ?Status is: Inpatient ?  ? ?Consultants:   ?Cardiology ?[facility] ? ?Procedures:   ?As noted above ? ?Antimicrobials:   ?None ? ? ?Subjective:  ?Has not had BM or flatus since surgery.  Tolerating diet.  Postop abdominal pain gradually improving.  Denies abdominal distention.  No nausea or vomiting.  Has been ambulating in the room.  No other complaints reported. ? ?Objective:  ? ?Vitals:  ? 04/20/22 2122 04/21/22 0500 04/21/22 1043 04/21/22 1321  ?BP:  129/84 113/71 118/76  ?Pulse: 72 77  69  ?Resp: '16 15 13 17  '$ ?Temp:  98.4 ?F (36.9 ?C)  97.8 ?F (36.6 ?C)  ?TempSrc:    Oral  ?SpO2: 91% 93%    ?Weight:      ?Height:      ? ? ?General exam: Middle-age male, moderately built and nourished sitting up comfortably at edge of bed. ?Respiratory system: Clear to auscultation.  No increased work of breathing. ?Cardiovascular system: S1 and S2 heard, RRR.  No JVD, murmurs or pedal edema.  Telemetry personally reviewed: Sinus rhythm in the 60s, BBB morphology. ?Gastrointestinal system: Midline abdominal laparoscopic site without acute findings.  Mild and appropriate abdominal postop tenderness.  Scant bowel sounds heard.  No abdominal distention. ?Central nervous system: Alert and oriented. No focal neurological deficits. ?Extremities: Symmetric 5 x 5 power.  Left upper extremity diffuse swelling has improved.  Now swelling mostly in the medial  left distal upper arm.  Also a cordlike structure palpated as noted above. ?Skin: No rashes, lesions or ulcers ?Psychiatry: Judgement and insight appear normal. Mood & affect appropriate.  ? ?Data Reviewed:   ?I have personally reviewed following labs and imaging studies ? ?CBC: ?Recent Labs  ?Lab 04/18/22 ?0102 04/18/22 ?1100 04/18/22 ?1218 04/19/22 ?0358 04/20/22 ?0106  ?WBC 6.6  --   --  9.8 10.5  ?HGB 8.0*   < > 10.9* 10.9* 10.4*  ?HCT 27.5*   < > 32.0* 35.0* 34.9*  ?MCV 79.5*  --   --  79.5* 81.5  ?PLT 231  --   --  170 234  ? < > = values in this interval not displayed.  ? ?Basic Metabolic Panel: ?Recent Labs  ?Lab 04/16/22 ?0318 04/17/22 ?0254 04/18/22 ?0102 04/18/22 ?1100 04/18/22 ?1218 04/19/22 ?0358 04/20/22 ?0106 04/21/22 ?0950  ?NA 134* 136 138 142 139 137 136 139  ?K 3.5 3.2* 3.6 3.4* 4.2 4.1 4.6 4.2  ?CL 107 107 110  --   --  109 109 107  ?CO2 20* 24 23  --   --  21* 22 26  ?GLUCOSE 104* 102* 123*  --   --  90 113* 101*  ?BUN '12 9 9  '$ --   --  '9 11 8  '$ ?CREATININE 0.93 1.06 0.88  --   --  0.74 0.77 0.81  ?CALCIUM 8.0* 7.8* 7.7*  --   --  8.1* 7.9* 8.2*  ?MG 2.0 2.1  --   --   --  2.0 2.0  --   ? ?Liver Function Tests: ?Recent Labs  ?Lab  04/21/22 ?0950  ?AST 28  ?ALT 53*  ?ALKPHOS 43  ?BILITOT 1.0  ?PROT 5.7*  ?ALBUMIN 2.3*  ? ? ?CBG: ?Recent Labs  ?Lab 04/20/22 ?2117 04/21/22 ?0737 04/21/22 ?1124  ?GLUCAP 135* 100* 130*  ? ?Microbiology Studies:  ? ?Recent Results (from the past 240 hour(s))  ?Surgical pcr screen     Status: None  ? Collection Time: 04/17/22  8:46 PM  ? Specimen: Nasal Mucosa; Nasal Swab  ?Result Value Ref Range Status  ? MRSA, PCR NEGATIVE NEGATIVE Final  ? Staphylococcus aureus NEGATIVE NEGATIVE Final  ?  Comment: (NOTE) ?The Xpert SA Assay (FDA approved for NASAL specimens in patients 64 ?years of age and older), is one component of a comprehensive ?surveillance program. It is not intended to diagnose infection nor to ?guide or monitor treatment. ?Performed at [facility],  [facility] [address], [place], [place] ?[address] ?  ? ? ?Radiology Studies:  ?DG Abd 1 View ? ?Result Date: 04/21/2022 ?CLINICAL DATA:  Ileus EXAM: ABDOMEN - 1 VIEW COMPARISON:  04/16/2022 FINDINGS: Gaseous distension of

## 2022-04-21 NOTE — Anesthesia Postprocedure Evaluation (Signed)
Anesthesia Post Note ? ?Patient: Gregory [name] ? ?Procedure(s) Performed: HAND ASSISTED LAPAROSCOPIC RIGHT COLON RESECTION ? ?  ? ?Patient location during evaluation: PACU ?Anesthesia Type: General ?Level of consciousness: awake and alert ?Pain management: pain level controlled ?Vital Signs Assessment: post-procedure vital signs reviewed and stable ?Respiratory status: spontaneous breathing, nonlabored ventilation, respiratory function stable and patient connected to nasal cannula oxygen ?Cardiovascular status: blood pressure returned to baseline and stable ?Postop Assessment: no apparent nausea or vomiting ?Anesthetic complications: no ? ? ?No notable events documented. ? ?Last Vitals:  ?Vitals:  ? 04/20/22 2122 04/21/22 0500  ?BP:  129/84  ?Pulse: 72 77  ?Resp: 16 15  ?Temp:  36.9 ?C  ?SpO2: 91% 93%  ?  ?Last Pain:  ?Vitals:  ? 04/20/22 2248  ?TempSrc:   ?PainSc: Asleep  ? ? ?  ?  ?  ?  ?  ?  ? ?Merlinda Frederick ? ? ? ? ?

## 2022-04-21 NOTE — Progress Notes (Signed)
Upper extremity venous has been completed.  ? ?Preliminary results in CV Proc.  ? ?Wynell Halberg Hilari Wethington ?04/21/2022 2:26 PM    ?

## 2022-04-21 NOTE — Progress Notes (Signed)
? ?Progress Note ? ?Patient Name: [facility] ?Date of Encounter: 04/21/2022 ? ?[facility] Cardiologist: Sanda Klein, MD  ? ?Subjective  ? ?No chest pain or dyspnea ? ?Inpatient Medications  ?  ?Scheduled Meds: ? acetaminophen  1,000 mg Oral Q6H  ? amiodarone  400 mg Oral Daily  ? atorvastatin  80 mg Oral QPM  ? Chlorhexidine Gluconate Cloth  6 each Topical Daily  ? dapagliflozin propanediol  10 mg Oral Daily  ? ezetimibe  10 mg Oral Daily  ? heparin  5,000 Units Subcutaneous Q8H  ? insulin aspart  0-6 Units Subcutaneous TID WC  ? levETIRAcetam  500 mg Oral BID  ? metoprolol succinate  25 mg Oral BID  ? pantoprazole  40 mg Oral BID  ? sacubitril-valsartan  1 tablet Oral BID  ? sodium chloride flush  3 mL Intravenous Q12H  ? sodium chloride flush  3 mL Intravenous Q12H  ? ?Continuous Infusions: ? sodium chloride    ? ?PRN Meds: ?sodium chloride, diphenhydrAMINE, melatonin, methocarbamol, morphine injection, ondansetron (ZOFRAN) IV, oxyCODONE, sodium chloride flush  ? ?Vital Signs  ?  ?Vitals:  ? 04/20/22 1401 04/20/22 2010 04/20/22 2122 04/21/22 0500  ?BP: 100/70 112/63  129/84  ?Pulse: 68  72 77  ?Resp: '17 18 16 15  '$ ?Temp: 99 ?F (37.2 ?C) 98.3 ?F (36.8 ?C)  98.4 ?F (36.9 ?C)  ?TempSrc: Oral Oral    ?SpO2: 96% 90% 91% 93%  ?Weight:      ?Height:      ? ? ?Intake/Output Summary (Last 24 hours) at 04/21/2022 0611 ?Last data filed at 04/20/2022 2203 ?Gross per 24 hour  ?Intake 3 ml  ?Output 220 ml  ?Net -217 ml  ? ? ?  04/18/2022  ?  8:17 AM 04/16/2022  ? 12:23 PM 04/12/2022  ?  3:50 AM  ?Last 3 Weights  ?Weight (lbs) 211 lb 211 lb 3.2 oz 211 lb 1.6 oz  ?Weight (kg) 95.709 kg 95.8 kg 95.754 kg  ?   ? ?Telemetry  ?  ?sinus - Personally Reviewed ? ?ECG  ?  ?No new ECG tracing today. - Personally Reviewed ? ?Physical Exam  ? ?GEN: No acute distress.   ?Neck: No JVD ?Cardiac: RRR, no murmurs, rubs, or gallops.  ?Respiratory: Clear to auscultation bilaterally. ?GI: Soft ?MS: No edema; No deformity. ?Neuro:   Nonfocal  ?Psych: Normal affect  ? ?Labs  ?  ?High Sensitivity Troponin:  No results for input(s): TROPONINIHS in the last 720 hours.   ?Chemistry ?Recent Labs  ?Lab 04/17/22 ?2505 04/18/22 ?0102 04/18/22 ?1100 04/18/22 ?1218 04/19/22 ?0358 04/20/22 ?0106  ?NA 136 138   < > 139 137 136  ?K 3.2* 3.6   < > 4.2 4.1 4.6  ?CL 107 110  --   --  109 109  ?CO2 24 23  --   --  21* 22  ?GLUCOSE 102* 123*  --   --  90 113*  ?BUN 9 9  --   --  9 11  ?CREATININE 1.06 0.88  --   --  0.74 0.77  ?CALCIUM 7.8* 7.7*  --   --  8.1* 7.9*  ?MG 2.1  --   --   --  2.0 2.0  ?GFRNONAA >60 >60  --   --  >60 >60  ?ANIONGAP 5 5  --   --  7 5  ? < > = values in this interval not displayed.  ?  ?Lipids No results for input(s):  CHOL, TRIG, HDL, LABVLDL, LDLCALC, CHOLHDL in the last 168 hours.  ?Hematology ?Recent Labs  ?Lab 04/18/22 ?0102 04/18/22 ?1100 04/18/22 ?1218 04/19/22 ?0358 04/20/22 ?0106  ?WBC 6.6  --   --  9.8 10.5  ?RBC 3.46*  --   --  4.40 4.28  ?HGB 8.0*   < > 10.9* 10.9* 10.4*  ?HCT 27.5*   < > 32.0* 35.0* 34.9*  ?MCV 79.5*  --   --  79.5* 81.5  ?MCH 23.1*  --   --  24.8* 24.3*  ?MCHC 29.1*  --   --  31.1 29.8*  ?RDW 19.6*  --   --  19.1* 19.9*  ?PLT 231  --   --  170 234  ? < > = values in this interval not displayed.  ? ?Thyroid No results for input(s): TSH, FREET4 in the last 168 hours.  ?BNPNo results for input(s): BNP, PROBNP in the last 168 hours.  ?DDimer No results for input(s): DDIMER in the last 168 hours.  ? ?Radiology  ?  ?No results found. ? ?Cardiac Studies  ? ?Echocardiogram 04/07/2022: ?Impressions: ? 1. 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  ? 2. LV function is depressed with hypokinesis of the lateral wall,  ?mid/distal inferior wall and apex; akinesis with aneurysmal dilitation of  ?inferior base. . Left ventricular ejection fraction, by estimation, is 35  ?to 40%. The left ventricle has  ?moderately decreased function.  The left ventricular internal cavity size  ?was moderately dilated. There is mild left ventricular hypertrophy. Left  ?ventricular diastolic parameters are indeterminate.  ? 3. Right ventricular systolic function is normal. The right ventricular  ?size is normal. There is mildly elevated pulmonary artery systolic  ?pressure.  ? 4. Left atrial size was severely dilated.  ? 5. Right atrial size was mildly dilated.  ? 6. The mitral valve is normal in structure. Mild mitral valve  ?regurgitation.  ? 7. The aortic valve is tricuspid. Aortic valve regurgitation is not  ?visualized. Aortic valve sclerosis/calcification is present, without any  ?evidence of aortic stenosis.  ? 8. There is borderline dilatation of the aortic root, measuring 39 mm.  ?There is borderline dilatation of the ascending aorta, measuring 39 mm.  ? ?Patient Profile  ?   ?61 y.o. male with a history of CAD s/p prior PCI/DES OM1 and 1st Diag with CTO of RCA and LCX beyond OM1, ischemic cardiomyopathy/ chronic combined CHF with EF of 35-40% on Echo in 03/2022, recurrent sustained VT s/p Med[employer]onic dual chamber ICD on Amiodarone, paroxysmal atrial fibrillation on Eliquis, hypertension, hyperlipidemia, obstructive sleep apnea on CPAP, and iron deficiency anemia who was admitted on 04/11/2022 with symptomatic anemia with hemoglobin of 4. S/p 4 units of PRBCs. Cardiology was consulted for pre-op evaluation prior to colonoscopy/EGD. He was ultimately found to have a mass in his colon and then underwent laparoscopic right colon resection on 04/18/2022. ? ?Assessment & Plan  ?  ?CAD without angina: No chest pain. CAD stable by recent cardiac cath. Plavix is on hold for now with recent anemia and GI bleeding. Restart when ok from a surgical standpoint. Continue beta blocker/statin/Zetia.  ? ?Ischemic Cardiomyopathy/Chronic Combined CHF: Recent Echo on 04/07/2022 showed LVEF of 35-40% with akinesis with aneurysmal dilatation of inferior base. CAD is stable. He is  euvolemic on exam. Continue Toprol, Entresto and [place].  ? ?History of VT: ICD in place. Continue po amiodarone.  ? ?  PAF: Eliquis on hold with recent anemia. Continue Toprol and amiodarone and resume Eliquis when safe from a post surgical standpoint.  ? ?Cardiology will sign off. Please call with questions.  ? ?For questions or updates, please contact [place] ?Please consult [email or web address] for contact info under  ? ? ?Signed,  ?Lauree Chandler ?04/21/2022 ?11:20 AM ? ?

## 2022-04-21 NOTE — Progress Notes (Signed)
3 Days Post-Op  ? ?Subjective/Chief Complaint: ?No flatus/BM- no N/V pain around incision ? ? ?Objective: ?Vital signs in last 24 hours: ?Temp:  [98.3 ?F (36.8 ?C)-99 ?F (37.2 ?C)] 98.4 ?F (36.9 ?C) (05/08 0500) ?Pulse Rate:  [68-77] 77 (05/08 0500) ?Resp:  [15-18] 15 (05/08 0500) ?BP: (100-129)/(63-84) 129/84 (05/08 0500) ?SpO2:  [90 %-96 %] 93 % (05/08 0500) ?Last BM Date : 04/16/22 ? ?Intake/Output from previous day: ?05/07 0701 - 05/08 0700 ?In: 243 [P.O.:240; I.V.:3] ?Out: 220 [Urine:220] ?Intake/Output this shift: ?No intake/output data recorded. ? ?A and O X 4  ?Resp: nonlabored ?Card: rate controlled ?Abd: soft, incisions c/d/I, no erythema or ecchymosis ?Lab Results:  ?Recent Labs  ?  04/19/22 ?2878 04/20/22 ?0106  ?WBC 9.8 10.5  ?HGB 10.9* 10.4*  ?HCT 35.0* 34.9*  ?PLT 170 234  ? ?BMET ?Recent Labs  ?  04/19/22 ?0358 04/20/22 ?0106  ?NA 137 136  ?K 4.1 4.6  ?CL 109 109  ?CO2 21* 22  ?GLUCOSE 90 113*  ?BUN 9 11  ?CREATININE 0.74 0.77  ?CALCIUM 8.1* 7.9*  ? ?PT/INR ?No results for input(s): LABPROT, INR in the last 72 hours. ?ABG ?Recent Labs  ?  04/18/22 ?1100 04/18/22 ?1218  ?PHART 7.328* 7.337*  ?HCO3 20.2 20.8  ? ? ?Studies/Results: ?No results found. ? ?Anti-infectives: ?Anti-infectives (From admission, onward)  ? ? Start     Dose/Rate Route Frequency Ordered Stop  ? 04/18/22 1015  cefoTEtan (CEFOTAN) 2 g in sodium chloride 0.9 % 100 mL IVPB  Status:  Discontinued       ? 2 g ?200 mL/hr over 30 Minutes Intravenous Every 12 hours 04/18/22 1011 04/18/22 1416  ? ?  ? ? ?Assessment/Plan: ?s/p Procedure(s): ?HAND ASSISTED LAPAROSCOPIC RIGHT COLON RESECTION (N/A) ? ?KUB ?Add Miralax  ?Ambulate  ?Check lytes  ? LOS: 9 days  ? ? ?Gregory [name] A Cherryl Babin ?04/21/2022 ? ?

## 2022-04-22 ENCOUNTER — Other Ambulatory Visit: Payer: Self-pay

## 2022-04-22 DIAGNOSIS — K567 Ileus, unspecified: Secondary | ICD-10-CM

## 2022-04-22 DIAGNOSIS — I480 Paroxysmal atrial fibrillation: Secondary | ICD-10-CM | POA: Diagnosis not present

## 2022-04-22 DIAGNOSIS — C189 Malignant neoplasm of colon, unspecified: Secondary | ICD-10-CM

## 2022-04-22 DIAGNOSIS — I472 Ventricular tachycardia, unspecified: Secondary | ICD-10-CM | POA: Diagnosis not present

## 2022-04-22 LAB — GLUCOSE, CAPILLARY
Glucose-Capillary: 115 mg/dL — ABNORMAL HIGH (ref 70–99)
Glucose-Capillary: 90 mg/dL (ref 70–99)
Glucose-Capillary: 107 mg/dL — ABNORMAL HIGH (ref 70–99)
Glucose-Capillary: 166 mg/dL — ABNORMAL HIGH (ref 70–99)
Glucose-Capillary: 115 mg/dL — ABNORMAL HIGH (ref 70–99)

## 2022-04-22 LAB — CBC
HCT: 36 % — ABNORMAL LOW (ref 39.0–52.0)
Hemoglobin: 10.8 g/dL — ABNORMAL LOW (ref 13.0–17.0)
MCH: 24.8 pg — ABNORMAL LOW (ref 26.0–34.0)
MCHC: 30 g/dL (ref 30.0–36.0)
MCV: 82.8 fL (ref 80.0–100.0)
Platelets: 304 10*3/uL (ref 150–400)
RBC: 4.35 MIL/uL (ref 4.22–5.81)
RDW: 21.5 % — ABNORMAL HIGH (ref 11.5–15.5)
WBC: 11.1 10*3/uL — ABNORMAL HIGH (ref 4.0–10.5)
nRBC: 0.2 % (ref 0.0–0.2)

## 2022-04-22 LAB — BASIC METABOLIC PANEL
Anion gap: 6 (ref 5–15)
BUN: 8 mg/dL (ref 8–23)
CO2: 26 mmol/L (ref 22–32)
Calcium: 8.2 mg/dL — ABNORMAL LOW (ref 8.9–10.3)
Chloride: 105 mmol/L (ref 98–111)
Creatinine, Ser: 0.87 mg/dL (ref 0.61–1.24)
GFR, Estimated: >60 mL/min (ref >60)
Glucose, Bld: 91 mg/dL (ref 70–99)
Potassium: 3.6 mmol/L (ref 3.5–5.1)
Sodium: 137 mmol/L (ref 135–145)

## 2022-04-22 LAB — MAGNESIUM: Magnesium: 2 mg/dL (ref 1.7–2.4)

## 2022-04-22 MED ORDER — POTASSIUM CHLORIDE CRYS ER 20 MEQ PO TBCR
40.0000 meq | EXTENDED_RELEASE_TABLET | Freq: Once | ORAL | Status: AC
  Administered 2022-04-22: 40 meq via ORAL
  Filled 2022-04-22: qty 2

## 2022-04-22 MED ORDER — CLOPIDOGREL BISULFATE 75 MG PO TABS
75.0000 mg | ORAL_TABLET | Freq: Every day | ORAL | Status: DC
  Administered 2022-04-23 – 2022-04-25 (×3): 75 mg via ORAL
  Filled 2022-04-22 (×3): qty 1

## 2022-04-22 NOTE — Progress Notes (Signed)
? ?Progress Note ? ?Patient Name: [facility] ?Date of Encounter: 04/22/2022 ? ?[facility] Cardiologist: Sanda Klein, MD  ? ?Subjective  ? ?He has no complaints today. No chest pain. Unaware of his VT earlier.  ? ?Inpatient Medications  ?  ?Scheduled Meds: ? acetaminophen  1,000 mg Oral Q6H  ? amiodarone  400 mg Oral Daily  ? apixaban  5 mg Oral BID  ? atorvastatin  80 mg Oral QPM  ? Chlorhexidine Gluconate Cloth  6 each Topical Daily  ? dapagliflozin propanediol  10 mg Oral Daily  ? ezetimibe  10 mg Oral Daily  ? insulin aspart  0-6 Units Subcutaneous TID WC  ? levETIRAcetam  500 mg Oral BID  ? metoprolol succinate  25 mg Oral BID  ? pantoprazole  40 mg Oral BID  ? polyethylene glycol  17 g Oral BID  ? potassium chloride  40 mEq Oral Once  ? sacubitril-valsartan  1 tablet Oral BID  ? sodium chloride flush  3 mL Intravenous Q12H  ? sodium chloride flush  3 mL Intravenous Q12H  ? ?Continuous Infusions: ? sodium chloride    ? ?PRN Meds: ?sodium chloride, diphenhydrAMINE, melatonin, methocarbamol, ondansetron (ZOFRAN) IV, oxyCODONE, sodium chloride flush  ? ?Vital Signs  ?  ?Vitals:  ? 04/22/22 0300 04/22/22 0618 04/22/22 0925 04/22/22 1144  ?BP: 128/79 120/85 124/83 109/79  ?Pulse: 68  67 60  ?Resp: '16 15 17 16  '$ ?Temp: 98.3 ?F (36.8 ?C)   98.6 ?F (37 ?C)  ?TempSrc: Oral   Oral  ?SpO2: 96%     ?Weight:      ?Height:      ? ? ?Intake/Output Summary (Last 24 hours) at 04/22/2022 1251 ?Last data filed at 04/22/2022 0930 ?Gross per 24 hour  ?Intake 243 ml  ?Output --  ?Net 243 ml  ? ? ?  04/18/2022  ?  8:17 AM 04/16/2022  ? 12:23 PM 04/12/2022  ?  3:50 AM  ?Last 3 Weights  ?Weight (lbs) 211 lb 211 lb 3.2 oz 211 lb 1.6 oz  ?Weight (kg) 95.709 kg 95.8 kg 95.754 kg  ?   ? ?Telemetry  ?  ?Sinus, long run of slow VT noted- Personally Reviewed ? ?ECG  ?  ?Sinus, IVCD - Personally Reviewed ? ?Physical Exam  ? ?General: Well developed, well nourished, NAD  ?HEENT: OP clear, mucus membranes moist  ?SKIN: warm, dry. No  rashes. ?Neuro: No focal deficits  ?Musculoskeletal: Muscle strength 5/5 all ext  ?Psychiatric: Mood and affect normal  ?Neck: No JVD, no carotid bruits, no thyromegaly, no lymphadenopathy.  ?Lungs:Clear bilaterally, no wheezes, rhonci, crackles ?Cardiovascular: Regular rate and rhythm. No murmurs, gallops or rubs. ?Abdomen:Soft. Bowel sounds present. Non-tender.  ?Extremities: No lower extremity edema. Pulses are 2 + in the bilateral DP/PT. ? ?Labs  ?  ?High Sensitivity Troponin:  No results for input(s): TROPONINIHS in the last 720 hours.   ?Chemistry ?Recent Labs  ?Lab 04/19/22 ?1638 04/20/22 ?0106 04/21/22 ?4665 04/22/22 ?0209  ?NA 137 136 139 137  ?K 4.1 4.6 4.2 3.6  ?CL 109 109 107 105  ?CO2 21* '22 26 26  '$ ?GLUCOSE 90 113* 101* 91  ?BUN '9 11 8 8  '$ ?CREATININE 0.74 0.77 0.81 0.87  ?CALCIUM 8.1* 7.9* 8.2* 8.2*  ?MG 2.0 2.0  --  2.0  ?PROT  --   --  5.7*  --   ?ALBUMIN  --   --  2.3*  --   ?AST  --   --  28  --   ?ALT  --   --  53*  --   ?ALKPHOS  --   --  75  --   ?BILITOT  --   --  1.0  --   ?GFRNONAA >60 >60 >60 >60  ?ANIONGAP '7 5 6 6  '$ ?  ?Lipids No results for input(s): CHOL, TRIG, HDL, LABVLDL, LDLCALC, CHOLHDL in the last 168 hours.  ?Hematology ?Recent Labs  ?Lab 04/19/22 ?8469 04/20/22 ?0106 04/22/22 ?0209  ?WBC 9.8 10.5 11.1*  ?RBC 4.40 4.28 4.35  ?HGB 10.9* 10.4* 10.8*  ?HCT 35.0* 34.9* 36.0*  ?MCV 79.5* 81.5 82.8  ?MCH 24.8* 24.3* 24.8*  ?MCHC 31.1 29.8* 30.0  ?RDW 19.1* 19.9* 21.5*  ?PLT 170 234 304  ? ?Thyroid No results for input(s): TSH, FREET4 in the last 168 hours.  ?BNPNo results for input(s): BNP, PROBNP in the last 168 hours.  ?DDimer No results for input(s): DDIMER in the last 168 hours.  ? ?Radiology  ?  ?DG Abd 1 View ? ?Result Date: 04/21/2022 ?CLINICAL DATA:  Ileus EXAM: ABDOMEN - 1 VIEW COMPARISON:  04/16/2022 FINDINGS: Gaseous distension of the small bowel and colon likely reflecting an ileus. Small amount of contrast in the splenic flexure. No bowel dilatation to suggest obstruction. No  evidence of pneumoperitoneum, portal venous gas or pneumatosis. No pathologic calcifications along the expected course of the ureters. No acute osseous abnormality. IMPRESSION: 1. Gaseous distension of the small bowel and colon likely reflecting an ileus. Electronically Signed   By: Kathreen Devoid M.D.   On: 04/21/2022 10:39  ? ?VAS [place] UPPER EXTREMITY VENOUS DUPLEX ? ?Result Date: 04/21/2022 ?UPPER VENOUS STUDY  Patient Name:  [name]  Date of Exam:   04/21/2022 Medical Rec #: [id number]                  Accession #:    [id number] Date of Birth: 08/06/1961                  Patient Gender: M Patient Age:   61 years Exam Location:  [facility] Procedure:      VAS [place] UPPER EXTREMITY VENOUS DUPLEX Referring Phys: ANAND HONGALGI --------------------------------------------------------------------------------  Indications: swelling, suspecting IV infitration Comparison Study: no prior Performing Technologist: Archie Patten RVS  Examination Guidelines: A complete evaluation includes B-mode imaging, spectral Doppler, color Doppler, and power Doppler as needed of all accessible portions of each vessel. Bilateral testing is considered an integral part of a complete examination. Limited examinations for reoccurring indications may be performed as noted.  Right Findings: +----------+------------+---------+-----------+----------+-------+ RIGHT     CompressiblePhasicitySpontaneousPropertiesSummary +----------+------------+---------+-----------+----------+-------+ Subclavian    Full       Yes       Yes                      +----------+------------+---------+-----------+----------+-------+  Left Findings: +----------+------------+---------+-----------+----------+-----------------+ LEFT      CompressiblePhasicitySpontaneousProperties     Summary      +----------+------------+---------+-----------+----------+-----------------+ IJV           Full       Yes       Yes                                 +----------+------------+---------+-----------+----------+-----------------+ Subclavian    Full       Yes       Yes                                +----------+------------+---------+-----------+----------+-----------------+  Axillary      Full       Yes       Yes                                +----------+------------+---------+-----------+----------+-----------------+ Brachial      Full       Yes       Yes                                +----------+------------+---------+-----------+----------+-----------------+ Radial        Full                                                    +----------+------------+---------+-----------+----------+-----------------+ Ulnar         Full                                                    +----------+------------+---------+-----------+----------+-----------------+ Cephalic      Full                                                    +----------+------------+---------+-----------+----------+-----------------+ Basilic       None                                  Age Indeterminate +----------+------------+---------+-----------+----------+-----------------+  Summary:  Right: No evidence of thrombosis in the subclavian.  Left: No evidence of deep vein thrombosis in the upper extremity. Findings consistent with age indeterminate superficial vein thrombosis involving the left basilic vein.  *See table(s) above for measurements and observations.  Diagnosing physician: Servando Snare MD Electronically signed by Servando Snare MD on 04/21/2022 at 7:50:44 PM.    Final    ? ?Cardiac Studies  ? ?Echocardiogram 04/07/2022: ?Impressions: ? 1. 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  ? 2. LV function is depressed with hypokinesis of the lateral wall,  ?mid/distal inferior wall and apex; akinesis with aneurysmal dilitation of  ?inferior base. . Left  ventricular ejection fraction, by estimation, is 35  ?to 40%. The left ventricle has  ?moderately decreased function. The left ventricular internal cavity size  ?was moderately dilated. There is mild left ventricular hypertrophy. Left  ?ventricular diastolic parameters are indeterminate.

## 2022-04-22 NOTE — Progress Notes (Signed)
Pt has been in sustained VT rate 120-121 over 4 minutes while eating.  Pt has been asymptomatic.  Cardiology is aware. ? ?Idolina Primer, RN ?

## 2022-04-22 NOTE — Progress Notes (Signed)
Pt came out of VT.  He was in sustained VT for 14 minutes.  He is now in [place] in 33s. ? ?Idolina Primer, RN ?

## 2022-04-22 NOTE — Progress Notes (Signed)
?PROGRESS NOTE ?  ?Pierce [place]  NWG:[id number]    DOB: November 10, 1961    DOA: 04/11/2022 ? ?PCP: Patient, [name]  ? ?I have briefly reviewed patients previous medical records in [facility]. ? ?Chief Complaint  ?Patient presents with  ? Abnormal Lab  ? ? ?Hospital Course:  ?61 year old male with medical history significant for CAD s/p stent, chronic systolic CHF, ventricular tachycardia, ICD placement, recurrent VTE, atrial fibrillation, type II DM, had lab work done by his cardiologist and was found to have hemoglobin in the 4 g range and was advised to come to the ED.  Admitted for severe symptomatic anemia in the absence of overt GI bleed, stool chronically dark on iron, FOBT +.  S/p EGD and colonoscopy 5/3-showed partially obstructing colonic mass suspicious for colon cancer.  General surgery and medical oncology consulted.  S/p cardiac cath 5/4.  S/p hand-assisted laparoscopic right colectomy 5/5.  Ongoing postop ileus, flatus +, no BM yet.  Having periodic runs of sustained VT, asymptomatic, cardiology aware. ? ?Assessment and Plan: ?* Colon cancer ([place]) ?Presented with severe symptomatic anemia. ?Work-up including colonoscopy finally revealed distal ascending versus proximal transverse colon partially obstructing mass ?CT C/A/P confirms infiltrative mass in the ascending colon concerning for primary colonic neoplasm measuring approximately 6.5 x 4.7 x 7.4 cm with evidence of local infiltration.  ?General surgery consulted and s/p hand-assisted laparoscopic right colectomy on 5/5 after cardiac clearance. 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 CEA interestingly negative at 2.5. ?Oncology follow-up appreciated.  Planning PET scan as outpatient and review of case at the GI tumor board. ?Ongoing postop ileus, confirmed on KUB, but tolerating diet.  ?Discussed with Dr. Brantley Stage, CCS, advised mobilizing, MiraLAX and minimize opioids.  Patient  counseled. ?Operative pathology is pending. ? ?FINAL MICROSCOPIC DIAGNOSIS: (Colonoscopy) ? ?A. COLON MASS, BIOPSY:  ?- Moderately differentiated colonic adenocarcinoma.  ?- See comment.  ? ?Postoperative ileus ([place]) ?Flatus +.  No BM yet. ?Encouraged continued mobilization, minimize opioids. ?Having good bowel sounds and should have a BM soon. ? ?Symptomatic anemia/iron deficiency anemia ?Longstanding history of iron deficiency anemia. ?No prior endoscopies to evaluate ?Presented with hemoglobin in the 4 g range, FOBT + in the setting of Eliquis and Plavix. ?No history of overt GI bleed.  Stool chronically dark but on iron supplements. ?EGD and colonoscopy done.  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 ?S/p Feraheme x1 dose on 5/5. ?S/p 7 units PRBC transfusion this admission.  Hemoglobin stable and 10 g range postop. ? ?Ventricular tachycardia ([place]) ?Recurrent VT s/p AICD ?Follows with Dr. Caryl Comes, referred to Dr. Lovena Le for ablation, can follow-up as outpatient. ?Was on IV amiodarone peri procedures, IV amiodarone discontinued. ?Continue p.o. amiodarone 400 Mg daily. ?Patient had extended, 10+ minutes of asymptomatic slow sustained VT in the 100s that around 6 AM on 5/9 and again sustained VT for 14 minutes this afternoon. ?Requested cardiology to reassess, have discussed with EP cardiology who did not recommend any changes.  Continue current dose of Toprol-XL and amiodarone. ? ?Chronic combined systolic and diastolic CHF (congestive heart failure) (HCC)/ischemic cardiomyopathy ?Cardiology team follow-up appreciated. ?Echo from 04/07/2022: LVEF 35-40%, worse compared to 09/22/2020 (LVEF 40-45%) ?Clinically euvolemic ?Continue Farxiga, metoprolol XL and Entresto. ? ?CAD S/P percutaneous coronary angioplasty ?LHC 09/24/2020: Known total occlusion of right coronary artery and previously placed stents were widely patent. ?No anginal symptoms. ?As per communication with  CCS, may resume Plavix 5/10.  Ordered. ?Underwent cardiac cath 5/4:  Per cardiology, anatomy similar to 2018 cath except new 50% stenosis proximal to OM stent and 50% stenosis proximal to diagonal stent.  No new high-grade significant stenosis. ?Continue beta-blockers, statins and Zetia. ? ?Paroxysmal atrial fibrillation ([place]) ?Continue metoprolol and oral amiodarone. ?Eliquis on hold.  Eliquis to be resumed when cleared by general surgery. ? ?Essential hypertension ?Controlled. ? ?Seizures ([name]) ?Continue Keppra. ? ?OSA on CPAP ?Continue CPAP. ? ?Hypokalemia ?Replaced to try to keep potassium >4.  Magnesium 2. ?Follow BMP ? ?Left upper extremity swelling ?L Basilic vein SVT ?Per RN, reported IV amiodarone infiltration last week. ?Cordlike structure palpated on the medial aspect of the distal upper arm and proximal forearm, suspect SVT. ?Venous Doppler confirms above. ?Continue elevation of extremity. ?Improving. ? ?Body mass index is 24.38 kg/m?. ? ? ?DVT prophylaxis: SCDs Start: 04/12/22 0259   ?  Code Status: Full Code:  ?Family Communication: None at bedside ?Disposition:  ?Status is: Inpatient ?  ? ?Consultants:   ?Cardiology ?[facility] ? ?Procedures:   ?As noted above ? ?Antimicrobials:   ?None ? ? ?Subjective:  ?Passing flatus.  No BM yet.  Has been mobilizing.  Tolerating diet.  Some abdominal pain, especially when trying to get up from bed.  No chest pain, dyspnea or palpitations. ? ?Objective:  ? ?Vitals:  ? 04/22/22 0300 04/22/22 0618 04/22/22 0925 04/22/22 1144  ?BP: 128/79 120/85 124/83 109/79  ?Pulse: 68  67 60  ?Resp: '16 15 17 16  '$ ?Temp: 98.3 ?F (36.8 ?C)   98.6 ?F (37 ?C)  ?TempSrc: Oral   Oral  ?SpO2: 96%     ?Weight:      ?Height:      ? ? ?General exam: Middle-age male, moderately built and nourished sitting up comfortably in bed. ?Respiratory system: Clear to auscultation.  No increased work of breathing. ?Cardiovascular system: S1 and S2 heard, RRR.  No JVD, murmurs or pedal edema.  Telemetry  personally reviewed: Sinus rhythm.  Had an episode of sustained slow VT at around 100/min at 6 AM, lasted about 10 minutes. ?Gastrointestinal system: Midline abdominal laparoscopic site without acute findings.  Mild and appropriate abdominal postop tenderness.  Near normal bowel sounds heard.  No abdominal distention. ?Central nervous system: Alert and oriented. No focal neurological deficits. ?Extremities: Symmetric 5 x 5 power.  Left upper extremity diffuse swelling has improved.  Now swelling mostly in the medial left distal upper arm.  Also a cordlike structure palpated as noted above. ?Skin: No rashes, lesions or ulcers ?Psychiatry: Judgement and insight appear normal. Mood & affect appropriate.  ? ?Data Reviewed:   ?I have personally reviewed following labs and imaging studies ? ?CBC: ?Recent Labs  ?Lab 04/19/22 ?5956 04/20/22 ?0106 04/22/22 ?0209  ?WBC 9.8 10.5 11.1*  ?HGB 10.9* 10.4* 10.8*  ?HCT 35.0* 34.9* 36.0*  ?MCV 79.5* 81.5 82.8  ?PLT 170 234 304  ? ?Basic Metabolic Panel: ?Recent Labs  ?Lab 04/16/22 ?0318 04/17/22 ?3875 04/18/22 ?0102 04/18/22 ?1100 04/18/22 ?1218 04/19/22 ?6433 04/20/22 ?0106 04/21/22 ?2951 04/22/22 ?0209  ?NA 134* 136 138   < > 139 137 136 139 137  ?K 3.5 3.2* 3.6   < > 4.2 4.1 4.6 4.2 3.6  ?CL 107 107 110  --   --  109 109 107 105  ?CO2 20* 24 23  --   --  21* '22 26 26  '$ ?GLUCOSE 104* 102* 123*  --   --  90 113* 101* 91  ?BUN '12 9 9  '$ --   --  $'9 11 8 8  'v$ ?CREATININE 0.93 1.06 0.88  --   --  0.74 0.77 0.81 0.87  ?CALCIUM 8.0* 7.8* 7.7*  --   --  8.1* 7.9* 8.2* 8.2*  ?MG 2.0 2.1  --   --   --  2.0 2.0  --  2.0  ? < > = values in this interval not displayed.  ? ?Liver Function Tests: ?Recent Labs  ?Lab 04/21/22 ?0950  ?AST 28  ?ALT 53*  ?ALKPHOS 43  ?BILITOT 1.0  ?PROT 5.7*  ?ALBUMIN 2.3*  ? ? ?CBG: ?Recent Labs  ?Lab 04/21/22 ?2149 04/22/22 ?0801 04/22/22 ?1144  ?GLUCAP 115* 90 107*  ? ?Microbiology Studies:  ? ?Recent Results (from the past 240 hour(s))  ?Surgical pcr screen      Status: None  ? Collection Time: 04/17/22  8:46 PM  ? Specimen: Nasal Mucosa; Nasal Swab  ?Result Value Ref Range Status  ? MRSA, PCR NEGATIVE NEGATIVE Final  ? Staphylococcus aureus NEGATIVE NEGATIVE Final  ?  Comme

## 2022-04-22 NOTE — Progress Notes (Signed)
Patient wants to go on CPAP at around 1 am.  Switched mask for him, complaining about straps on other mask. ?

## 2022-04-22 NOTE — Assessment & Plan Note (Signed)
Flatus +.  No BM yet. ?Encouraged continued mobilization, minimize opioids. ?Having good bowel sounds and should have a BM soon. ?

## 2022-04-22 NOTE — TOC Initial Note (Signed)
Transition of Care (TOC) - Initial/Assessment Note  ? ? ?Patient Details  ?Name: [facility] ?MRN: [id number] ?Date of Birth: Nov 07, 1961 ? ?Transition of Care (TOC) CM/SW Contact:    ?Graves-Bigelow, Ocie Cornfield, RN ?Phone Number: ?04/22/2022, 12:52 PM ? ?Clinical Narrative: Risk for readmission assessment completed. PTA patient was from home with family support. Patient states he has transportation to appointments and gets his medications without any issues. Patient states he will have transportation home- daughter will transport home. Patient states he is ambulating well. Patient reports he will not need any home health services at this point. Case Manager will continue to follow for additional transition of care needs.                ? ? ?Expected Discharge Plan: Home/Self Care ?Barriers to Discharge: Continued Medical Work up (needs to have a BM) ? ? ?Patient Goals and CMS Choice ?Patient states their goals for this hospitalization and ongoing recovery are:: to return home. ?  ?Choice offered to / list presented to : NA ? ?Expected Discharge Plan and Services ?Expected Discharge Plan: Home/Self Care ?In-house Referral: NA ?Discharge Planning Services: CM Consult ?Post Acute Care Choice: NA ?Living arrangements for the past 2 months: [place] ?                ?DME Arranged: N/A ?  ?  ?  ?  ?HH Arranged: NA ?  ?  ?  ?  ? ?Prior Living Arrangements/Services ?Living arrangements for the past 2 months: [place] ?Lives with:: Adult Children, Self ?Patient language and need for interpreter reviewed:: Yes ?       ?Need for Family Participation in Patient Care: Yes (Comment) ?Care giver support system in place?: Yes (comment) ?  ?Criminal Activity/Legal Involvement Pertinent to Current Situation/Hospitalization: No - Comment as needed ? ?Activities of Daily Living ?Home Assistive Devices/Equipment: CPAP ?ADL Screening (condition at time of admission) ?Patient's cognitive ability adequate to  safely complete daily activities?: Yes ?Is the patient deaf or have difficulty hearing?: No ?Does the patient have difficulty seeing, even when wearing glasses/contacts?: No ?Does the patient have difficulty concentrating, remembering, or making decisions?: No ?Patient able to express need for assistance with ADLs?: Yes ?Does the patient have difficulty dressing or bathing?: No ?Independently performs ADLs?: Yes (appropriate for developmental age) ?Does the patient have difficulty walking or climbing stairs?: No ?Weakness of Legs: None ?Weakness of Arms/Hands: None ? ?Permission Sought/Granted ?Permission sought to share information with : Case Manager, Family Supports ?  ?   ? ?Emotional Assessment ?Appearance:: Appears stated age ?Attitude/Demeanor/Rapport: Engaged ?Affect (typically observed): Appropriate ?Orientation: : Oriented to Situation, Oriented to  Time, Oriented to Place, Oriented to Self ?Alcohol / Substance Use: Not Applicable ?Psych Involvement: No (comment) ? ?Admission diagnosis:  Acute GI bleeding [K92.2] ?Heme positive stool [R19.5] ?Symptomatic anemia [D64.9] ?Patient Active Problem List  ? Diagnosis Date Noted  ? Left upper extremity swelling 04/18/2022  ? Colon cancer ([place]) 04/17/2022  ? Hypokalemia 04/17/2022  ? Heme positive stool   ? Colonic mass   ? Benign neoplasm of colon   ? Anticoagulated   ? Symptomatic anemia/iron deficiency anemia 04/12/2022  ? ICD (implantable cardioverter-defibrillator) in place 03/21/2022  ? Low magnesium level 10/08/2020  ? Labile blood glucose   ? Slow transit constipation   ? [place]   ? Sleep disturbance   ? Acute blood loss anemia   ? Hypoalbuminemia due to protein-calorie malnutrition ([place])   ?  Diabetes mellitus, new onset ([place])   ? Seizures ([place])   ? Paroxysmal atrial fibrillation (HCC)   ? Debility 09/29/2020  ? Shock ([place]) 09/13/2020  ? Thrombocytopenia ([place])   ? CHF (congestive heart failure) ([place]) 09/09/2020  ? Atrial fibrillation with rapid ventricular  response ([place])   ? Localization-related idiopathic epilepsy and epileptic syndromes with seizures of localized onset, not intractable, without status epilepticus ([place]) 10/20/2018  ? Anemia 06/07/2018  ? Ventricular tachycardia ([name]) 05/04/2016  ? Chronic combined systolic and diastolic CHF (congestive heart failure) (HCC)/ischemic cardiomyopathy 12/01/2013  ? CAD S/P percutaneous coronary angioplasty 12/01/2013  ? Erectile dysfunction 12/01/2013  ? Essential hypertension 12/01/2013  ? Hypercholesterolemia 12/01/2013  ? OSA on CPAP 12/01/2013  ? ?PCP:  Patient, [name] ?Pharmacy:   ?[facility] [address] ?[place] ?[place] [address] ?Phone: [phone] Fax: [phone] ? ?[facility] [id number] [address] ?[place] ?[place] [address] ?Phone: [phone] Fax: [phone] ? ?Readmission Risk Interventions ? ?  04/22/2022  ? 12:49 PM  ?Readmission Risk Prevention Plan  ?Transportation Screening Complete  ?PCP or Specialist Appt within 3-5 Days Complete  ?[place] or Home Care Consult Complete  ?Social Work Consult for [place] Planning/Counseling Complete  ?Palliative Care Screening Not Applicable  ?Medication Review Press photographer) Complete  ? ? ? ?

## 2022-04-22 NOTE — Progress Notes (Signed)
Pt self administers CPAP. 

## 2022-04-22 NOTE — Discharge Instructions (Signed)
[facility]      [facility] [phone]  OPEN ABDOMINAL SURGERY: POST OP INSTRUCTIONS  Always review your discharge instruction sheet given to you by the facility where your surgery was performed.  IF YOU HAVE DISABILITY OR FAMILY LEAVE FORMS, YOU MUST BRING THEM TO THE OFFICE FOR PROCESSING.  PLEASE DO NOT GIVE THEM TO YOUR DOCTOR.  A prescription for pain medication may be given to you upon discharge.  Take your pain medication as prescribed, if needed.  If narcotic pain medicine is not needed, then you may take acetaminophen (Tylenol) or ibuprofen (Advil) as needed. Take your usually prescribed medications unless otherwise directed. If you need a refill on your pain medication, please contact your pharmacy. They will contact our office to request authorization.  Prescriptions will not be filled after 5pm or on week-ends. You should follow a light diet the first few days after arrival home, such as soup and crackers, pudding, etc.unless your doctor has advised otherwise. A high-fiber, low fat diet can be resumed as tolerated.   Be sure to include lots of fluids daily. Most patients will experience some swelling and bruising on the chest and neck area.  Ice packs will help.  Swelling and bruising can take several days to resolve Most patients will experience some swelling and bruising in the area of the incision. Ice pack will help. Swelling and bruising can take several days to resolve..  It is common to experience some constipation if taking pain medication after surgery.  Increasing fluid intake and taking a stool softener will usually help or prevent this problem from occurring.  A mild laxative (Milk of Magnesia or Miralax) should be taken according to package directions if there are no bowel movements after 48 hours.  You may have steri-strips (small skin tapes) in place directly over the incision.  These strips should be left on the skin for 7-10 days.  If your surgeon used skin  glue on the incision, you may shower in 24 hours.  The glue will flake off over the next 2-3 weeks.  Any sutures or staples will be removed at the office during your follow-up visit. You may find that a light gauze bandage over your incision may keep your staples from being rubbed or pulled. You may shower and replace the bandage daily. ACTIVITIES:  You may resume regular (light) daily activities beginning the next day--such as daily self-care, walking, climbing stairs--gradually increasing activities as tolerated.  You may have sexual intercourse when it is comfortable.  Refrain from any heavy lifting or straining until approved by your doctor. You may drive when you no longer are taking prescription pain medication, you can comfortably wear a seatbelt, and you can safely maneuver your car and apply brakes Return to Work: ___________________________________ You should see your doctor in the office for a follow-up appointment approximately two weeks after your surgery.  Make sure that you call for this appointment within a day or two after you arrive home to insure a convenient appointment time. OTHER INSTRUCTIONS:  _____________________________________________________________ _____________________________________________________________  WHEN TO CALL YOUR DOCTOR: Fever over 101.0 Inability to urinate Nausea and/or vomiting Extreme swelling or bruising Continued bleeding from incision. Increased pain, redness, or drainage from the incision. Difficulty swallowing or breathing Muscle cramping or spasms. Numbness or tingling in hands or feet or around lips.  The clinic staff is available to answer your questions during regular business hours.  Please don't hesitate to call and ask to speak to one of   the nurses if you have concerns.  For further questions, please visit [email or web address]  

## 2022-04-22 NOTE — Progress Notes (Signed)
? ? ?4 Days Post-Op  ?Subjective: ?+flatus, no BM, tolerating solid diet with no nausea or vomiting.  Ambulating well. ? ?ROS: See above, otherwise other systems negative ? ?Objective: ?Vital signs in last 24 hours: ?Temp:  [97.8 ?F (36.6 ?C)-98.6 ?F (37 ?C)] 98.3 ?F (36.8 ?C) (05/09 0300) ?Pulse Rate:  [63-69] 67 (05/09 0925) ?Resp:  [13-17] 17 (05/09 0925) ?BP: (105-128)/(70-85) 124/83 (05/09 0925) ?SpO2:  [96 %-97 %] 96 % (05/09 0300) ?Last BM Date : 04/16/22 ? ?Intake/Output from previous day: ?05/08 0701 - 05/09 0700 ?In: 243 [P.O.:240; I.V.:3] ?Out: -  ?Intake/Output this shift: ?Total I/O ?In: 3 [I.V.:3] ?Out: -  ? ?PE: ?Abd: soft, appropriately tender, incision c/d/I with dermabond present, +BS, minimal distention ? ?Lab Results:  ?Recent Labs  ?  04/20/22 ?0106 04/22/22 ?0209  ?WBC 10.5 11.1*  ?HGB 10.4* 10.8*  ?HCT 34.9* 36.0*  ?PLT 234 304  ? ?BMET ?Recent Labs  ?  04/21/22 ?0950 04/22/22 ?0209  ?NA 139 137  ?K 4.2 3.6  ?CL 107 105  ?CO2 26 26  ?GLUCOSE 101* 91  ?BUN 8 8  ?CREATININE 0.81 0.87  ?CALCIUM 8.2* 8.2*  ? ?PT/INR ?No results for input(s): LABPROT, INR in the last 72 hours. ?CMP  ?   ?Component Value Date/Time  ? NA 137 04/22/2022 0209  ? NA 137 04/11/2022 1219  ? K 3.6 04/22/2022 0209  ? CL 105 04/22/2022 0209  ? CO2 26 04/22/2022 0209  ? GLUCOSE 91 04/22/2022 0209  ? BUN 8 04/22/2022 0209  ? BUN 13 04/11/2022 1219  ? CREATININE 0.87 04/22/2022 0209  ? CREATININE 1.08 12/12/2016 1445  ? CALCIUM 8.2 (L) 04/22/2022 0209  ? PROT 5.7 (L) 04/21/2022 0950  ? PROT 6.5 02/28/2022 1304  ? ALBUMIN 2.3 (L) 04/21/2022 0950  ? ALBUMIN 4.0 02/28/2022 1304  ? AST 28 04/21/2022 0950  ? ALT 53 (H) 04/21/2022 0950  ? ALKPHOS 43 04/21/2022 0950  ? BILITOT 1.0 04/21/2022 0950  ? BILITOT 0.2 02/28/2022 1304  ? GFRNONAA >60 04/22/2022 0209  ? GFRAA 91 11/15/2020 0959  ? ?Lipase  ?   ?Component Value Date/Time  ? LIPASE 120 (H) 09/09/2020 1810  ? ? ? ? ? ?Studies/Results: ?DG Abd 1 View ? ?Result Date:  04/21/2022 ?CLINICAL DATA:  Ileus EXAM: ABDOMEN - 1 VIEW COMPARISON:  04/16/2022 FINDINGS: Gaseous distension of the small bowel and colon likely reflecting an ileus. Small amount of contrast in the splenic flexure. No bowel dilatation to suggest obstruction. No evidence of pneumoperitoneum, portal venous gas or pneumatosis. No pathologic calcifications along the expected course of the ureters. No acute osseous abnormality. IMPRESSION: 1. Gaseous distension of the small bowel and colon likely reflecting an ileus. Electronically Signed   By: Kathreen Devoid M.D.   On: 04/21/2022 10:39  ? ?VAS [place] UPPER EXTREMITY VENOUS DUPLEX ? ?Result Date: 04/21/2022 ?UPPER VENOUS STUDY  Patient Name:  Gregory [name]  Date of Exam:   04/21/2022 Medical Rec #: [id number]                  Accession #:    [id number] Date of Birth: 10-31-1961                  Patient Gender: M Patient Age:   61 years Exam Location:  [facility] Procedure:      VAS [place] UPPER EXTREMITY VENOUS DUPLEX Referring Phys: ANAND HONGALGI --------------------------------------------------------------------------------  Indications: swelling, suspecting IV infitration Comparison Study: no prior Performing  Technologist: Archie Patten RVS  Examination Guidelines: A complete evaluation includes B-mode imaging, spectral Doppler, color Doppler, and power Doppler as needed of all accessible portions of each vessel. Bilateral testing is considered an integral part of a complete examination. Limited examinations for reoccurring indications may be performed as noted.  Right Findings: +----------+------------+---------+-----------+----------+-------+ RIGHT     CompressiblePhasicitySpontaneousPropertiesSummary +----------+------------+---------+-----------+----------+-------+ Subclavian    Full       Yes       Yes                      +----------+------------+---------+-----------+----------+-------+  Left Findings:  +----------+------------+---------+-----------+----------+-----------------+ LEFT      CompressiblePhasicitySpontaneousProperties     Summary      +----------+------------+---------+-----------+----------+-----------------+ IJV           Full       Yes       Yes                                +----------+------------+---------+-----------+----------+-----------------+ Subclavian    Full       Yes       Yes                                +----------+------------+---------+-----------+----------+-----------------+ Axillary      Full       Yes       Yes                                +----------+------------+---------+-----------+----------+-----------------+ Brachial      Full       Yes       Yes                                +----------+------------+---------+-----------+----------+-----------------+ Radial        Full                                                    +----------+------------+---------+-----------+----------+-----------------+ Ulnar         Full                                                    +----------+------------+---------+-----------+----------+-----------------+ Cephalic      Full                                                    +----------+------------+---------+-----------+----------+-----------------+ Basilic       None                                  Age Indeterminate +----------+------------+---------+-----------+----------+-----------------+  Summary:  Right: No evidence of thrombosis in the subclavian.  Left: No evidence of deep vein thrombosis in the upper extremity. Findings consistent with age indeterminate superficial vein thrombosis involving the left  basilic vein.  *See table(s) above for measurements and observations.  Diagnosing physician: Servando Snare MD Electronically signed by Servando Snare MD on 04/21/2022 at 7:50:44 PM.    Final    ? ?Anti-infectives: ?Anti-infectives (From admission, onward)  ? ? Start      Dose/Rate Route Frequency Ordered Stop  ? 04/18/22 1015  cefoTEtan (CEFOTAN) 2 g in sodium chloride 0.9 % 100 mL IVPB  Status:  Discontinued       ? 2 g ?200 mL/hr over 30 Minutes Intravenous Every 12 hours 04/18/22 1011 04/18/22 1416  ? ?  ? ? ? ?Assessment/Plan ?POD 4, s/p lap assisted R colectomy for adenocarcinoma, Dr. Thermon Leyland 04/18/22 ?-doing well.  Having flatus.  Tolerating card mod diet.  Awaiting BM ?-path from surgery still pending ?-ambulating well ?-oncology already on board and following  ?-adjust to oral pain meds ? ?FEN - carb mod ?VTE - eliquis ?ID - none ? 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 ? ? LOS: 10 days  ? ? ?Henreitta Cea , PA-C ?[facility] ?04/22/2022, 9:56 AM ?Please see Amion for pager number during day hours 7:00am-4:30pm or 7:00am -11:30am on weekends ? ?

## 2022-04-23 ENCOUNTER — Other Ambulatory Visit: Payer: Self-pay

## 2022-04-23 DIAGNOSIS — G4733 Obstructive sleep apnea (adult) (pediatric): Secondary | ICD-10-CM

## 2022-04-23 DIAGNOSIS — M7989 Other specified soft tissue disorders: Secondary | ICD-10-CM

## 2022-04-23 DIAGNOSIS — Z7901 Long term (current) use of anticoagulants: Secondary | ICD-10-CM | POA: Diagnosis not present

## 2022-04-23 DIAGNOSIS — K567 Ileus, unspecified: Secondary | ICD-10-CM

## 2022-04-23 DIAGNOSIS — C189 Malignant neoplasm of colon, unspecified: Secondary | ICD-10-CM | POA: Diagnosis not present

## 2022-04-23 DIAGNOSIS — E876 Hypokalemia: Secondary | ICD-10-CM

## 2022-04-23 DIAGNOSIS — R569 Unspecified convulsions: Secondary | ICD-10-CM

## 2022-04-23 DIAGNOSIS — I2510 Atherosclerotic heart disease of native coronary artery without angina pectoris: Secondary | ICD-10-CM | POA: Diagnosis not present

## 2022-04-23 DIAGNOSIS — K6389 Other specified diseases of intestine: Secondary | ICD-10-CM

## 2022-04-23 DIAGNOSIS — Z9989 Dependence on other enabling machines and devices: Secondary | ICD-10-CM

## 2022-04-23 DIAGNOSIS — D649 Anemia, unspecified: Secondary | ICD-10-CM | POA: Diagnosis not present

## 2022-04-23 DIAGNOSIS — K9189 Other postprocedural complications and disorders of digestive system: Secondary | ICD-10-CM

## 2022-04-23 LAB — GLUCOSE, CAPILLARY
Glucose-Capillary: 101 mg/dL — ABNORMAL HIGH (ref 70–99)
Glucose-Capillary: 107 mg/dL — ABNORMAL HIGH (ref 70–99)
Glucose-Capillary: 116 mg/dL — ABNORMAL HIGH (ref 70–99)
Glucose-Capillary: 134 mg/dL — ABNORMAL HIGH (ref 70–99)

## 2022-04-23 LAB — BASIC METABOLIC PANEL
Anion gap: 4 — ABNORMAL LOW (ref 5–15)
BUN: 9 mg/dL (ref 8–23)
CO2: 27 mmol/L (ref 22–32)
Calcium: 8.2 mg/dL — ABNORMAL LOW (ref 8.9–10.3)
Chloride: 106 mmol/L (ref 98–111)
Creatinine, Ser: 0.78 mg/dL (ref 0.61–1.24)
GFR, Estimated: >60 mL/min (ref >60)
Glucose, Bld: 115 mg/dL — ABNORMAL HIGH (ref 70–99)
Potassium: 3.9 mmol/L (ref 3.5–5.1)
Sodium: 137 mmol/L (ref 135–145)

## 2022-04-23 LAB — CBC
HCT: 36.9 % — ABNORMAL LOW (ref 39.0–52.0)
Hemoglobin: 11.1 g/dL — ABNORMAL LOW (ref 13.0–17.0)
MCH: 24.8 pg — ABNORMAL LOW (ref 26.0–34.0)
MCHC: 30.1 g/dL (ref 30.0–36.0)
MCV: 82.4 fL (ref 80.0–100.0)
Platelets: 355 10*3/uL (ref 150–400)
RBC: 4.48 MIL/uL (ref 4.22–5.81)
RDW: 22.4 % — ABNORMAL HIGH (ref 11.5–15.5)
WBC: 8.9 10*3/uL (ref 4.0–10.5)
nRBC: 0 % (ref 0.0–0.2)

## 2022-04-23 LAB — MAGNESIUM: Magnesium: 2.1 mg/dL (ref 1.7–2.4)

## 2022-04-23 MED ORDER — BISACODYL 5 MG PO TBEC
5.0000 mg | DELAYED_RELEASE_TABLET | Freq: Every day | ORAL | Status: DC | PRN
  Administered 2022-04-23: 5 mg via ORAL
  Filled 2022-04-23: qty 1

## 2022-04-23 NOTE — Progress Notes (Signed)
Patient placed on CPAP for the night  

## 2022-04-23 NOTE — Progress Notes (Signed)
I called Mr Mcinturff to schedule f/u appt with Dr Burr Medico.  I left a vm requesting a call back. ?

## 2022-04-23 NOTE — Progress Notes (Signed)
Patient did not have BM this shift.  Given scheduled miralax this morning and Biscodyl at 1210hrs.  Continues to pass flatus and hypoactive bowel sounds x 4 quadrants.  Will continue to monitor. ?

## 2022-04-23 NOTE — Progress Notes (Signed)
5 Days Post-Op  ? ?Subjective/Chief Complaint: ?Pt with flatus no BM yet  ?No N/V  ? ? ?Objective: ?Vital signs in last 24 hours: ?Temp:  [97.7 ?F (36.5 ?C)-98.8 ?F (37.1 ?C)] 97.7 ?F (36.5 ?C) (05/10 0730) ?Pulse Rate:  [54-64] 59 (05/10 0730) ?Resp:  [16-18] 16 (05/10 0730) ?BP: (109-122)/(78-81) 117/80 (05/10 0730) ?SpO2:  [92 %-93 %] 93 % (05/10 0416) ?Last BM Date : 04/16/22 ? ?Intake/Output from previous day: ?05/09 0701 - 05/10 0700 ?In: 963 [P.O.:960; I.V.:3] ?Out: -  ?Intake/Output this shift: ?Total I/O ?In: 240 [P.O.:240] ?Out: -  ? ? ?Abd: soft, appropriately tender, incision c/d/I with dermabond present, +BS, minimal distention ?Lab Results:  ?Recent Labs  ?  04/22/22 ?0209 04/23/22 ?0322  ?WBC 11.1* 8.9  ?HGB 10.8* 11.1*  ?HCT 36.0* 36.9*  ?PLT 304 355  ? ?BMET ?Recent Labs  ?  04/22/22 ?0209 04/23/22 ?0322  ?NA 137 137  ?K 3.6 3.9  ?CL 105 106  ?CO2 26 27  ?GLUCOSE 91 115*  ?BUN 8 9  ?CREATININE 0.87 0.78  ?CALCIUM 8.2* 8.2*  ? ?PT/INR ?No results for input(s): LABPROT, INR in the last 72 hours. ?ABG ?No results for input(s): PHART, HCO3 in the last 72 hours. ? ?Invalid input(s): PCO2, PO2 ? ?Studies/Results: ?VAS [place] UPPER EXTREMITY VENOUS DUPLEX ? ?Result Date: 04/21/2022 ?UPPER VENOUS STUDY  Patient Name:  Gregory [name]  Date of Exam:   04/21/2022 Medical Rec #: [id number]                  Accession #:    [id number] Date of Birth: 1961/08/30                  Patient Gender: M Patient Age:   61 years Exam Location:  [facility] Procedure:      VAS [place] UPPER EXTREMITY VENOUS DUPLEX Referring Phys: ANAND HONGALGI --------------------------------------------------------------------------------  Indications: swelling, suspecting IV infitration Comparison Study: no prior Performing Technologist: Archie Patten RVS  Examination Guidelines: A complete evaluation includes B-mode imaging, spectral Doppler, color Doppler, and power Doppler as needed of all accessible portions of each vessel.  Bilateral testing is considered an integral part of a complete examination. Limited examinations for reoccurring indications may be performed as noted.  Right Findings: +----------+------------+---------+-----------+----------+-------+ RIGHT     CompressiblePhasicitySpontaneousPropertiesSummary +----------+------------+---------+-----------+----------+-------+ Subclavian    Full       Yes       Yes                      +----------+------------+---------+-----------+----------+-------+  Left Findings: +----------+------------+---------+-----------+----------+-----------------+ LEFT      CompressiblePhasicitySpontaneousProperties     Summary      +----------+------------+---------+-----------+----------+-----------------+ IJV           Full       Yes       Yes                                +----------+------------+---------+-----------+----------+-----------------+ Subclavian    Full       Yes       Yes                                +----------+------------+---------+-----------+----------+-----------------+ Axillary      Full       Yes       Yes                                +----------+------------+---------+-----------+----------+-----------------+  Brachial      Full       Yes       Yes                                +----------+------------+---------+-----------+----------+-----------------+ Radial        Full                                                    +----------+------------+---------+-----------+----------+-----------------+ Ulnar         Full                                                    +----------+------------+---------+-----------+----------+-----------------+ Cephalic      Full                                                    +----------+------------+---------+-----------+----------+-----------------+ Basilic       None                                  Age Indeterminate  +----------+------------+---------+-----------+----------+-----------------+  Summary:  Right: No evidence of thrombosis in the subclavian.  Left: No evidence of deep vein thrombosis in the upper extremity. Findings consistent with age indeterminate superficial vein thrombosis involving the left basilic vein.  *See table(s) above for measurements and observations.  Diagnosing physician: Servando Snare MD Electronically signed by Servando Snare MD on 04/21/2022 at 7:50:44 PM.    Final    ? ?Anti-infectives: ?Anti-infectives (From admission, onward)  ? ? Start     Dose/Rate Route Frequency Ordered Stop  ? 04/18/22 1015  cefoTEtan (CEFOTAN) 2 g in sodium chloride 0.9 % 100 mL IVPB  Status:  Discontinued       ? 2 g ?200 mL/hr over 30 Minutes Intravenous Every 12 hours 04/18/22 1011 04/18/22 1416  ? ?  ? ? ?Assessment/Plan: ?POD 5  s/p lap assisted R colectomy for adenocarcinoma, Dr. Thermon Leyland 04/18/22 ?-doing well.  Having flatus.  Tolerating card mod diet.  Awaiting BM ?-path from surgery REVIEWED- STAGE 3 oncology has seen  ?-ambulating well ?-oncology already on board and following  ?-adjust to oral pain meds ?  ?FEN - carb mod ?VTE - eliquis ?ID - none ?Home once he has a BM  ? LOS: 11 days  ? ? ?Shelda Truby A Dejon Jungman ?04/23/2022 ? ?

## 2022-04-23 NOTE — Progress Notes (Signed)
?Progress Note ? ?Patient: Gregory [name] FWY:[id number] DOB: 11/01/1961  ?DOA: 04/11/2022  DOS: 04/23/2022  ?  ?Brief hospital course: ?61 year old male with medical history significant for CAD s/p stent, chronic systolic CHF, ventricular tachycardia, ICD placement, recurrent VTE, atrial fibrillation, type II DM, had lab work done by his cardiologist and was found to have hemoglobin in the 4 g range and was advised to come to the ED.  Admitted for severe symptomatic anemia in the absence of overt GI bleed, stool chronically dark on iron, FOBT +.  S/p EGD and colonoscopy 5/3-showed partially obstructing colonic mass suspicious for colon cancer.  General surgery and medical oncology consulted.  S/p cardiac cath 5/4.  S/p hand-assisted laparoscopic right colectomy 5/5.  Ongoing postop ileus, flatus +, no BM yet.  Having periodic runs of sustained VT, asymptomatic, cardiology aware. ? ?Assessment and Plan: ?Moderately differentiated colon adenocarcinoma: Based on surgical pathology. CEA oddly wnl at 2.5.  ?- Oncology aware and will arrange follow up for PET scan. Treatment plan to be discussed at GI tumor board.  ? ?Postoperative ileus: Still no BM though +flatus. Hypoactive BS on exam which is tender but without rebound. ?- Continue frequently ambulating, miralax BID. Defer additional management to surgery at this time. ? ?Symptomatic anemia/iron deficiency anemia ?Longstanding history of iron deficiency anemia. ?No prior endoscopies to evaluate ?Presented with hemoglobin in the 4 g range, FOBT + in the setting of Eliquis and Plavix. ?No history of overt GI bleed.  Stool chronically dark but on iron supplements. ?EGD and colonoscopy done.  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 ?- Recommend repeat IV iron on/around 5/12. S/p Feraheme x1 dose on 5/5. ?- S/p 7 units PRBC transfusion this admission.   ?- Recheck CBC at follow up.  ? ?Recurrent VT s/p AICD, PAF:   ?- Follows with Dr. Caryl Comes, referred to Dr. Lovena Le for ablation, can follow-up as outpatient per cardiology. ?- Continue metoprolol and oral amiodarone (s/p gtt) ? ?Chronic combined HFrEF, ICM: Echo 04/07/2022: LVEF 35-40%, worse compared to 09/22/2020 (LVEF 40-45%). Clinically euvolemic.  ?- Continue farxiga, metoprolol XL and Entresto. ? ?CAD s/p PCI, HLD, HTN: Underwent cardiac cath 5/4: Per cardiology, anatomy similar to 2018 cath except new 50% stenosis proximal to OM stent and 50% stenosis proximal to diagonal stent.  No new high-grade significant stenosis. No anginal symptoms.  ?- Continue plavix (cleared by surgery), metoprolol, statin, zetia.   ? ?Seizure disorder:  ?- Continue keppra.  ? ?OSA:  ?- Continue CPAP ? ?Hypokalemia:  ?- Supplement to maintain K 4 and Mg 2.  ? ?Left basilic vein SVT: Improving symptoms ?- Can apply heat, elevate extremity.  ? ?Subjective: Abdominal pain is stable, no new complaints. No BM yet.  ? ?Objective: ?Vitals:  ? 04/23/22 0055 04/23/22 0118 04/23/22 0416 04/23/22 0730  ?BP: 122/80  121/81 117/80  ?Pulse:  (!) 54 (!) 57 (!) 59  ?Resp:  '18 18 16  '$ ?Temp:   98.5 ?F (36.9 ?C) 97.7 ?F (36.5 ?C)  ?TempSrc:   Oral Oral  ?SpO2:  93% 93%   ?Weight:      ?Height:      ? ?Gen: Nontoxic 61 y.o. male in no distress ?Pulm: Nonlabored breathing room air. Clear ?CV: Regular rate and rhythm. No murmur, rub, or gallop. No JVD, no dependent edema. ?GI: Abdomen soft, tender diffusely without rebound or guarding or rigidity Hypoactive bowel sounds.  ?Ext: Warm, no deformities ?Skin: Midline and lap surgical incisions c/d/I with  dermabond, no other rashes, lesions or ulcers on visualized skin. ?Neuro: Alert and oriented. No focal neurological deficits. ?Psych: Judgement and insight appear fair. Mood euthymic & affect congruent. Behavior is appropriate.   ? ?Data Personally reviewed: ? ?CBC: ?Recent Labs  ?Lab 04/18/22 ?0102 04/18/22 ?1100 04/18/22 ?1218 04/19/22 ?0102 04/20/22 ?0106 04/22/22 ?7253  04/23/22 ?6644  ?WBC 6.6  --   --  9.8 10.5 11.1* 8.9  ?HGB 8.0*   < > 10.9* 10.9* 10.4* 10.8* 11.1*  ?HCT 27.5*   < > 32.0* 35.0* 34.9* 36.0* 36.9*  ?MCV 79.5*  --   --  79.5* 81.5 82.8 82.4  ?PLT 231  --   --  170 234 304 355  ? < > = values in this interval not displayed.  ? ?Basic Metabolic Panel: ?Recent Labs  ?Lab 04/17/22 ?0347 04/18/22 ?0102 04/19/22 ?4259 04/20/22 ?5638 04/21/22 ?7564 04/22/22 ?3329 04/23/22 ?5188  ?NA 136   < > 137 136 139 137 137  ?K 3.2*   < > 4.1 4.6 4.2 3.6 3.9  ?CL 107   < > 109 109 107 105 106  ?CO2 24   < > 21* '22 26 26 27  '$ ?GLUCOSE 102*   < > 90 113* 101* 91 115*  ?BUN 9   < > '9 11 8 8 9  '$ ?CREATININE 1.06   < > 0.74 0.77 0.81 0.87 0.78  ?CALCIUM 7.8*   < > 8.1* 7.9* 8.2* 8.2* 8.2*  ?MG 2.1  --  2.0 2.0  --  2.0 2.1  ? < > = values in this interval not displayed.  ? ?GFR: ?Estimated Creatinine Clearance: 122.2 mL/min (by C-G formula based on SCr of 0.78 mg/dL). ?Liver Function Tests: ?Recent Labs  ?Lab 04/21/22 ?0950  ?AST 28  ?ALT 53*  ?ALKPHOS 43  ?BILITOT 1.0  ?PROT 5.7*  ?ALBUMIN 2.3*  ? ?No results for input(s): LIPASE, AMYLASE in the last 168 hours. ?No results for input(s): AMMONIA in the last 168 hours. ?Coagulation Profile: ?No results for input(s): INR, PROTIME in the last 168 hours. ?Cardiac Enzymes: ?No results for input(s): CKTOTAL, CKMB, CKMBINDEX, TROPONINI in the last 168 hours. ?BNP (last 3 results) ?No results for input(s): PROBNP in the last 8760 hours. ?HbA1C: ?No results for input(s): HGBA1C in the last 72 hours. ?CBG: ?Recent Labs  ?Lab 04/22/22 ?1144 04/22/22 ?1621 04/22/22 ?2106 04/23/22 ?4166 04/23/22 ?1142  ?GLUCAP 107* 166* 115* 101* 107*  ? ?Lipid Profile: ?No results for input(s): CHOL, HDL, LDLCALC, TRIG, CHOLHDL, LDLDIRECT in the last 72 hours. ?Thyroid Function Tests: ?No results for input(s): TSH, T4TOTAL, FREET4, T3FREE, THYROIDAB in the last 72 hours. ?Anemia Panel: ?No results for input(s): VITAMINB12, FOLATE, FERRITIN, TIBC, IRON, RETICCTPCT in  the last 72 hours. ?Urine analysis: ?   ?Component Value Date/Time  ? [place] YELLOW 09/16/2020 1034  ? APPEARANCEUR HAZY (A) 09/16/2020 1034  ? LABSPEC 1.018 09/16/2020 1034  ? PHURINE 5.0 09/16/2020 1034  ? GLUCOSEU 50 (A) 09/16/2020 1034  ? [place] NEGATIVE 09/16/2020 1034  ? [place] NEGATIVE 09/16/2020 1034  ? KETONESUR 5 (A) 09/16/2020 1034  ? PROTEINUR 100 (A) 09/16/2020 1034  ? NITRITE NEGATIVE 09/16/2020 1034  ? LEUKOCYTESUR NEGATIVE 09/16/2020 1034  ? ?Recent Results (from the past 240 hour(s))  ?Surgical pcr screen     Status: None  ? Collection Time: 04/17/22  8:46 PM  ? Specimen: Nasal Mucosa; Nasal Swab  ?Result Value Ref Range Status  ? MRSA, PCR NEGATIVE NEGATIVE Final  ? Staphylococcus  aureus NEGATIVE NEGATIVE Final  ?  Comment: (NOTE) ?The Xpert SA Assay (FDA approved for NASAL specimens in patients 4 ?years of age and older), is one component of a comprehensive ?surveillance program. It is not intended to diagnose infection nor to ?guide or monitor treatment. ?Performed at [facility], [place] [address] [place], [place] ?[address] ?  ?   ?Family Communication: None at bedside ? ?Disposition: ?Status is: Inpatient ?Remains inpatient appropriate because: Awaiting return of bowel function. Will discharge once cleared by general surgery ?Planned Discharge Destination: Home ? ? ? ? ? ?Patrecia Pour, MD ?04/23/2022 3:07 PM ?Page by [email or web address]  ?

## 2022-04-23 NOTE — Progress Notes (Signed)
Checked with patient to see if he was ready.  Patient stated he wasn't ready yet.  Asked patient to have RN to call when he was ready. ?

## 2022-04-23 NOTE — Progress Notes (Signed)
The proposed treatment discussed in conference is for discussion purpose only and is not a binding recommendation.  The patients have not been physically examined, or presented with their treatment options.  Therefore, final treatment plans cannot be decided.  

## 2022-04-23 NOTE — Progress Notes (Signed)
Cardiology Note: ? ?Chart reviewed. No VT over last 18 hours.  ?Please see my note from yesterday.  ?We will not plan to make any changes in his medical therapy. He has been known to have slow VT for months. He is followed closely by our EP team and they are aware of hospital events. ICD in place.  ?Please call if we can be of further assistance.  ? ?Lauree Chandler, MD, [facility] ?04/23/2022 ?11:05 AM ? ?

## 2022-04-23 NOTE — Plan of Care (Signed)
?  Problem: Activity: ?Goal: Ability to return to baseline activity level will improve ?Outcome: Progressing ?  ?Problem: Clinical Measurements: ?Goal: Will remain free from infection ?Outcome: Progressing ?  ?Problem: Activity: ?Goal: Risk for activity intolerance will decrease ?Outcome: Progressing ?  ?Problem: Nutrition: ?Goal: Adequate nutrition will be maintained ?Outcome: Progressing ?  ?Problem: Coping: ?Goal: Level of anxiety will decrease ?Outcome: Progressing ?  ?Problem: Elimination: ?Goal: Will not experience complications related to bowel motility ?Outcome: Not Progressing ?Goal: Will not experience complications related to urinary retention ?Outcome: Progressing ?  ?Problem: Pain Managment: ?Goal: General experience of comfort will improve ?Outcome: Progressing ?  ?Problem: Safety: ?Goal: Ability to remain free from injury will improve ?Outcome: Progressing ?  ?Problem: Skin Integrity: ?Goal: Risk for impaired skin integrity will decrease ?Outcome: Progressing ?  ?

## 2022-04-24 ENCOUNTER — Other Ambulatory Visit (HOSPITAL_COMMUNITY): Payer: Self-pay

## 2022-04-24 DIAGNOSIS — I2510 Atherosclerotic heart disease of native coronary artery without angina pectoris: Secondary | ICD-10-CM | POA: Diagnosis not present

## 2022-04-24 DIAGNOSIS — Z7901 Long term (current) use of anticoagulants: Secondary | ICD-10-CM | POA: Diagnosis not present

## 2022-04-24 DIAGNOSIS — C189 Malignant neoplasm of colon, unspecified: Secondary | ICD-10-CM | POA: Diagnosis not present

## 2022-04-24 DIAGNOSIS — D649 Anemia, unspecified: Secondary | ICD-10-CM | POA: Diagnosis not present

## 2022-04-24 LAB — GLUCOSE, CAPILLARY
Glucose-Capillary: 105 mg/dL — ABNORMAL HIGH (ref 70–99)
Glucose-Capillary: 138 mg/dL — ABNORMAL HIGH (ref 70–99)
Glucose-Capillary: 137 mg/dL — ABNORMAL HIGH (ref 70–99)
Glucose-Capillary: 105 mg/dL — ABNORMAL HIGH (ref 70–99)

## 2022-04-24 LAB — SURGICAL PATHOLOGY

## 2022-04-24 MED ORDER — BISACODYL 10 MG RE SUPP
10.0000 mg | Freq: Once | RECTAL | Status: AC
  Administered 2022-04-24: 10 mg via RECTAL
  Filled 2022-04-24: qty 1

## 2022-04-24 MED ORDER — AMIODARONE HCL 200 MG PO TABS
400.0000 mg | ORAL_TABLET | Freq: Every day | ORAL | 0 refills | Status: DC
  Filled 2022-04-24: qty 60, 30d supply, fill #0

## 2022-04-24 MED ORDER — OXYCODONE HCL 5 MG PO TABS
5.0000 mg | ORAL_TABLET | Freq: Four times a day (QID) | ORAL | 0 refills | Status: DC | PRN
  Filled 2022-04-24: qty 20, 3d supply, fill #0

## 2022-04-24 MED ORDER — MAGNESIUM HYDROXIDE 400 MG/5ML PO SUSP
30.0000 mL | Freq: Once | ORAL | Status: AC
  Administered 2022-04-24: 30 mL via ORAL
  Filled 2022-04-24: qty 30

## 2022-04-24 NOTE — Progress Notes (Signed)
? ? ?6 Days Post-Op  ?Subjective: ?CC: ?Feeling much better. Some soreness around his incision that is well controlled with medications but otherwise no abdominal pain. Tolerating diet and eating more - finished almost all of his breakfast. No n/v. Passing flatus. No BM since surgery yet. Just had a prune juice. Open to trying a suppository if that does not work. Reports Dr. Ernestina Penna office has reached out already to arrange a follow up appointment.  ? ?Objective: ?Vital signs in last 24 hours: ?Temp:  [97.7 ?F (36.5 ?C)-98.7 ?F (37.1 ?C)] 98.3 ?F (36.8 ?C) (05/11 0536) ?Pulse Rate:  [59-81] 64 (05/11 0536) ?Resp:  [14-18] 14 (05/11 0536) ?BP: (110-114)/(75-81) 114/81 (05/11 0536) ?SpO2:  [95 %-98 %] 95 % (05/11 0536) ?Last BM Date : 04/16/22 ? ?Intake/Output from previous day: ?05/10 0701 - 05/11 0700 ?In: 1200 [P.O.:1200] ?Out: -  ?Intake/Output this shift: ?No intake/output data recorded. ? ?PE: ?Gen:  Alert, NAD, pleasant ?Pulm: Rate and effort normal ?Abd: Soft, ND, appropriately tender around incision - otherwise NT. +BS, incisions with glue intact appears well and are without drainage, bleeding, or signs of infection ? ?Lab Results:  ?Recent Labs  ?  04/22/22 ?0209 04/23/22 ?0322  ?WBC 11.1* 8.9  ?HGB 10.8* 11.1*  ?HCT 36.0* 36.9*  ?PLT 304 355  ? ?BMET ?Recent Labs  ?  04/22/22 ?0209 04/23/22 ?0322  ?NA 137 137  ?K 3.6 3.9  ?CL 105 106  ?CO2 26 27  ?GLUCOSE 91 115*  ?BUN 8 9  ?CREATININE 0.87 0.78  ?CALCIUM 8.2* 8.2*  ? ?PT/INR ?No results for input(s): LABPROT, INR in the last 72 hours. ?CMP  ?   ?Component Value Date/Time  ? NA 137 04/23/2022 0322  ? NA 137 04/11/2022 1219  ? K 3.9 04/23/2022 0322  ? CL 106 04/23/2022 0322  ? CO2 27 04/23/2022 0322  ? GLUCOSE 115 (H) 04/23/2022 0322  ? BUN 9 04/23/2022 0322  ? BUN 13 04/11/2022 1219  ? CREATININE 0.78 04/23/2022 0322  ? CREATININE 1.08 12/12/2016 1445  ? CALCIUM 8.2 (L) 04/23/2022 0322  ? PROT 5.7 (L) 04/21/2022 0950  ? PROT 6.5 02/28/2022 1304  ? ALBUMIN  2.3 (L) 04/21/2022 0950  ? ALBUMIN 4.0 02/28/2022 1304  ? AST 28 04/21/2022 0950  ? ALT 53 (H) 04/21/2022 0950  ? ALKPHOS 43 04/21/2022 0950  ? BILITOT 1.0 04/21/2022 0950  ? BILITOT 0.2 02/28/2022 1304  ? GFRNONAA >60 04/23/2022 0322  ? GFRAA 91 11/15/2020 0959  ? ?Lipase  ?   ?Component Value Date/Time  ? LIPASE 120 (H) 09/09/2020 1810  ? ? ?Studies/Results: ?No results found. ? ?Anti-infectives: ?Anti-infectives (From admission, onward)  ? ? Start     Dose/Rate Route Frequency Ordered Stop  ? 04/18/22 1015  cefoTEtan (CEFOTAN) 2 g in sodium chloride 0.9 % 100 mL IVPB  Status:  Discontinued       ? 2 g ?200 mL/hr over 30 Minutes Intravenous Every 12 hours 04/18/22 1011 04/18/22 1416  ? ?  ? ? ?Assessment/Plan ?POD 6, s/p lap assisted R colectomy for adenocarcinoma, Dr. Thermon Leyland 04/18/22 ?- Path w/ Adenocarcinoma, moderate to poorly differentiated, 6.4 cm; Metastatic adenocarcinoma involving eleven of thirty-five lymph nodes  ?(11/35); Lymphovascular space invasion present; Margins uninvolved by carcinoma. Dr. Brantley Stage discussed with patient yesterday and he confirmed understanding of path today. Dr. Ernestina Penna team has seen and has reached out to arrange follow up ?- Doing well. Tolerating diet without n/v, eating more, pain well controlled on oral  medications, voiding and reports he is mobilizing without difficulty. If he has a bm today, he can be discharged from our standpoint. Will arrange follow up. Discussed discharge instructions, restrictions and precautions with the patient. Will reach out to [facility] to let them know.  ?  ?FEN - Carb mod, IVF per [facility], suppository  ?VTE - Eliquis & plavix. Hgb stable on labs 5/10 ?ID - Cefotetan periop. None currently.  ?  ?- Per [facility]/Cards -  ?Anemia - hgbs table ?CAD with history of PCI to LAD ?Recurrent ventricular tachycardia status post AICD placement ?Ischemic cardiomyopathy ?CHF, systolic and diastolic, LV EF 35 to 50% ?Atrial fibrillation ? ? LOS: 12 days  ? ? ?Jillyn Ledger , PA-C ?[facility] ?04/24/2022, 10:22 AM ?Please see Amion for pager number during day hours 7:00am-4:30pm ? ?

## 2022-04-24 NOTE — Plan of Care (Signed)
°  Problem: Clinical Measurements: °Goal: Respiratory complications will improve °Outcome: Progressing °Goal: Cardiovascular complication will be avoided °Outcome: Progressing °  °Problem: Activity: °Goal: Risk for activity intolerance will decrease °Outcome: Progressing °  °

## 2022-04-24 NOTE — Progress Notes (Signed)
I spoke with Gregory [name] and reviewed the appt with Dr Burr Medico that I made for him.  He is agreeable.  I told him our location, reviewed our free [place] parking, and asked him to arrive 15 minutes early. All questions were answered.  He verbalized understanding. ? ?

## 2022-04-24 NOTE — Progress Notes (Signed)
?Progress Note ? ?Patient: Gregory [name] ASN:[id number] DOB: 23-Feb-1961  ?DOA: 04/11/2022  DOS: 04/24/2022  ?  ?Brief hospital course: ?61 year old male with medical history significant for CAD s/p stent, chronic systolic CHF, ventricular tachycardia, ICD placement, recurrent VTE, atrial fibrillation, type II DM, had lab work done by his cardiologist and was found to have hemoglobin in the 4 g range and was advised to come to the ED.  Admitted for severe symptomatic anemia in the absence of overt GI bleed, stool chronically dark on iron, FOBT +.  S/p EGD and colonoscopy 5/3-showed partially obstructing colonic mass suspicious for colon cancer.  General surgery and medical oncology consulted.  S/p cardiac cath 5/4.  S/p hand-assisted laparoscopic right colectomy 5/5.  Ongoing postop ileus, flatus +, no BM yet.  Having periodic runs of sustained VT, asymptomatic, cardiology aware. ? 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 CEA oddly wnl at 2.5.  ?- Oncology follow up secured prior to discharge, follow up for PET scan. Treatment plan to be discussed at GI tumor board.  ? ?Postoperative ileus: Still no BM though +flatus. Hypoactive BS on exam which is tender but without rebound. ?- Continue frequently ambulating, miralax BID, suppository per surgery. If has BM can be discharged. ? ?Symptomatic anemia/iron deficiency anemia ?Longstanding history of iron deficiency anemia. ?No prior endoscopies to evaluate ?Presented with hemoglobin in the 4 g range, FOBT + in the setting of Eliquis and Plavix. ?No history of overt GI bleed.  Stool chronically dark but on iron supplements. ?EGD and colonoscopy done.  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 ?- Recommend repeat IV iron at follow up. S/p Feraheme x1 dose on 5/5. ?- S/p 7 units PRBC transfusion this admission.   ?- Recheck CBC  at follow up.  ? ?Recurrent VT s/p AICD, PAF:  ?- Follows with Dr. Caryl Comes, referred to Dr. Lovena Le for ablation, can follow-up as outpatient per cardiology. ?- Continue metoprolol and oral amiodarone (s/p gtt) at '400mg'$  until follow up. ? ?Chronic combined HFrEF, ICM: Echo 04/07/2022: LVEF 35-40%, worse compared to 09/22/2020 (LVEF 40-45%). Clinically euvolemic.  ?- Continue farxiga, metoprolol XL and Entresto. ? ?CAD s/p PCI, HLD, HTN: Underwent cardiac cath 5/4: Per cardiology, anatomy similar to 2018 cath except new 50% stenosis proximal to OM stent and 50% stenosis proximal to diagonal stent.  No new high-grade significant stenosis. No anginal symptoms.  ?- Continue plavix (cleared by surgery), metoprolol, statin, zetia.   ? ?Seizure disorder:  ?- Continue keppra.  ? ?GERD:  ?- Can continue PPI. Hiatal hernia without other abnormal finding on EGD. ? ?OSA:  ?- Continue CPAP ? ?Hypokalemia:  ?- Supplement to maintain K 4 and Mg 2. Standing K supplement continued. ? ?Left basilic vein SVT: Improving symptoms ?- Can apply heat, elevate extremity.  ? ?EGD 5/3 Dr. Havery Moros: ?Impression:       - Esophagogastric landmarks identified. ?                          - 2 cm hiatal hernia. ?                          - Normal stomach. ?                          - Normal duodenal bulb and  second portion of the  ?                          duodenum. ?                          No cause for bleeding / anemia on EGD ?Recommendation: - Proceed with colonoscopy ?                          - Recommendations per colonoscopy note ? ?COLONOSCOPY 5/3 Dr. Havery Moros:  ?Impression:        - Stool in the entire examined colon, several  ?                          minutes spent lavaging the colon ?                          - Likely malignant partially obstructing tumor in  ?                          the suspected proximal transverse colon vs. right  ?                          colon as above - could not traverse it. Biopsied.  ?                           Tattooed. ?                          - Five 3 to 10 mm polyps in the transverse colon,  ?                          removed with a cold snare. Resected and retrieved. ?                          - Two 6 to 8 mm polyps in the transverse colon,  ?                          removed with a hot snare. Resected and retrieved. ?                          - One 6 mm polyp in the descending colon, removed with a cold snare. Resected and retrieved. ?                          - Two 3 to 4 mm polyps in the sigmoid colon,  ?                          removed with a cold snare. Resected and retrieved. ?                          - One 15 mm polyp in the sigmoid colon, removed  ?  with a hot snare. Resected and retrieved. ?                          - One 3 to 4 mm polyp at the recto-sigmoid colon,  ?                          removed with a cold snare. Resected and retrieved. ?                          - Internal hemorrhoids. ?                          - The examination was otherwise normal. ?                          Colon mass is likely malignant and the cause of  ?                          anemia / bleeding. ?Recommendation:- Return patient to hospital ward for ongoing care. ?                          - Advance diet as tolerated. ?                          - Continue present medications. ?                          - Miralax BID to keep stools loose and prevent  ?                          impaction ?                          - Await pathology results. ?                          - Monitor Hgb ?                          - CT scan chest / abdomen / pelvis for staging.  ?                          Will also need surgical consult ?                          - Will need to discuss findings with cardiology  ?                          service - high risk for bleeding with resumption of  ?                          anticoagulation / antiplatelet therapy until  ?                          definitive therapy of mass  lesion ? ?  Subjective: Eating and drinking ok, no nausea or vomiting, still +flatus. No BM.  ? ?Objective: ?Vitals:  ? 04/23/22 1518 04/23/22 2020 04/23/22 2327 04/24/22 0536  ?BP: 112/75 110/75  114/81  ?Pulse: (!) 59 63 81 64  ?Resp: '15 14 18 14  '$ ?Temp: 97.7 ?F (36.5 ?C) 98.7 ?F (37.1 ?C)  98.3 ?F (36.8 ?C)  ?TempSrc: Oral Oral  Oral  ?SpO2: 98% 98% 95% 95%  ?Weight:      ?Height:      ? ?Gen: 61 y.o. male in no distress ?Pulm: Nonlabored breathing room air. Clear. ?CV: Regular rate and rhythm. No murmur, rub, or gallop. No JVD, no dependent edema. ?GI: Abdomen soft, appropriately tender without rebound or guarding, non-distended, with increased (normoactive) bowel sounds.  ?Ext: Warm, no deformities ?Skin: No new rashes, lesions or ulcers on visualized skin. Midline abdominal incision c/d/I w/dermabond.  ?Neuro: Alert and oriented. No focal neurological deficits. ?Psych: Judgement and insight appear fair. Mood euthymic & affect congruent. Behavior is appropriate.   ? ?Family Communication: None at bedside ? ?Disposition: ?Status is: Inpatient ?Remains inpatient appropriate because: Awaiting return of bowel function. Will discharge once cleared by general surgery ?Planned Discharge Destination: Home after having BM ? ?Patrecia Pour, MD ?04/24/2022 11:05 AM ?Page by [email or web address]  ?

## 2022-04-25 DIAGNOSIS — R569 Unspecified convulsions: Secondary | ICD-10-CM

## 2022-04-25 DIAGNOSIS — Z9989 Dependence on other enabling machines and devices: Secondary | ICD-10-CM

## 2022-04-25 DIAGNOSIS — K567 Ileus, unspecified: Secondary | ICD-10-CM

## 2022-04-25 DIAGNOSIS — K9189 Other postprocedural complications and disorders of digestive system: Secondary | ICD-10-CM

## 2022-04-25 DIAGNOSIS — G4733 Obstructive sleep apnea (adult) (pediatric): Secondary | ICD-10-CM

## 2022-04-25 DIAGNOSIS — C189 Malignant neoplasm of colon, unspecified: Secondary | ICD-10-CM

## 2022-04-25 DIAGNOSIS — M7989 Other specified soft tissue disorders: Secondary | ICD-10-CM

## 2022-04-25 DIAGNOSIS — E876 Hypokalemia: Secondary | ICD-10-CM

## 2022-04-25 DIAGNOSIS — K6389 Other specified diseases of intestine: Secondary | ICD-10-CM

## 2022-04-25 LAB — GLUCOSE, CAPILLARY: Glucose-Capillary: 109 mg/dL — ABNORMAL HIGH (ref 70–99)

## 2022-04-25 NOTE — Discharge Summary (Signed)
?Physician Discharge Summary ?  ?Patient: Gregory [name] MRN: [id number] DOB: 11/15/1961  ?Admit date:     04/11/2022  ?Discharge date: 04/25/22  ?Discharge Physician: Patrecia Pour  ? ?PCP: Patient, [name]  ? ?Recommendations at discharge:  ?Follow up with general surgery ?Follow up with cardiology/EP ?Follow up with GI oncology ?Recommend repeat IV iron at follow up ? ?Discharge Diagnoses: ?Principal Problem: ?  Colon cancer ([place]) ?Active Problems: ?  Postoperative ileus ([place]) ?  Symptomatic anemia/iron deficiency anemia ?  Ventricular tachycardia ([place]) ?  Chronic combined systolic and diastolic CHF (congestive heart failure) (HCC)/ischemic cardiomyopathy ?  CAD S/P percutaneous coronary angioplasty ?  Paroxysmal atrial fibrillation (HCC) ?  Essential hypertension ?  Seizures ([place]) ?  OSA on CPAP ?  Hypokalemia ?  Left upper extremity swelling ?  Anticoagulated ?  Heme positive stool ?  Colonic mass ?  Benign neoplasm of colon ? ? ?Hospital Course: ?61 year old male with medical history significant for CAD s/p stent, chronic systolic CHF, ventricular tachycardia, ICD placement, recurrent VTE, atrial fibrillation, type II DM, had lab work done by his cardiologist and was found to have hemoglobin in the 4 g range and was advised to come to the ED.  Admitted for severe symptomatic anemia in the absence of overt GI bleed, stool chronically dark on iron, FOBT +.  S/p EGD and colonoscopy 5/3-showed partially obstructing colonic mass suspicious for colon cancer.  General surgery and medical oncology consulted.  S/p cardiac cath 5/4.  S/p hand-assisted laparoscopic right colectomy 5/5.  Ongoing postop ileus, flatus +, no BM yet.  Having periodic runs of sustained VT, asymptomatic, cardiology aware. ? ?Problem-based details of hospital course below: ? 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 CEA oddly wnl at  2.5.  ?- Oncology follow up secured prior to discharge, follow up for PET scan. Treatment plan to be discussed at GI tumor board.  ?  ?Postoperative ileus: Resolved, so can discharge per surgery. Will follow up with [facility]. ?  ?Symptomatic anemia/iron deficiency anemia ?Longstanding history of iron deficiency anemia. ?No prior endoscopies to evaluate ?Presented with hemoglobin in the 4 g range, FOBT + in the setting of Eliquis and Plavix. ?No history of overt GI bleed.  Stool chronically dark but on iron supplements. ?EGD and colonoscopy done.  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 ?- Recommend repeat IV iron at follow up. S/p Feraheme x1 dose on 5/5. ?- S/p 7 units PRBC transfusion this admission.   ?- Recheck CBC at follow up.  ?  ?Recurrent VT s/p AICD, PAF:  ?- Follows with Dr. Caryl Comes, referred to Dr. Lovena Le for ablation, can follow-up as outpatient per cardiology. ?- Continue metoprolol and oral amiodarone (s/p gtt) at '400mg'$  until follow up. ?  ?Chronic combined HFrEF, ICM: Echo 04/07/2022: LVEF 35-40%, worse compared to 09/22/2020 (LVEF 40-45%). Clinically euvolemic.  ?- Continue farxiga, metoprolol XL and Entresto. ?  ?CAD s/p PCI, HLD, HTN: Underwent cardiac cath 5/4: Per cardiology, anatomy similar to 2018 cath except new 50% stenosis proximal to OM stent and 50% stenosis proximal to diagonal stent.  No new high-grade significant stenosis. No anginal symptoms.  ?- Continue plavix (cleared by surgery), metoprolol, statin, zetia.   ?  ?Seizure disorder:  ?- Continue keppra.  ?  ?GERD:  ?- Can continue PPI. Hiatal hernia without other abnormal finding on EGD. ?  ?OSA:  ?- Continue  CPAP ?  ?Hypokalemia:  ?- Supplement to maintain K 4 and Mg 2. Standing K supplement continued. ?  ?Left basilic vein SVT: Improving symptoms ?- Can apply heat, elevate extremity.  ?  ?EGD 5/3 Dr. Havery Moros: ?Impression:       - Esophagogastric landmarks identified. ?                           - 2 cm hiatal hernia. ?                          - Normal stomach. ?                          - Normal duodenal bulb and second portion of the  ?                          duodenum. ?                          No cause for bleeding / anemia on EGD ?Recommendation: - Proceed with colonoscopy ?                          - Recommendations per colonoscopy note ?  ?COLONOSCOPY 5/3 Dr. Havery Moros:  ?Impression:        - Stool in the entire examined colon, several  ?                          minutes spent lavaging the colon ?                          - Likely malignant partially obstructing tumor in  ?                          the suspected proximal transverse colon vs. right  ?                          colon as above - could not traverse it. Biopsied.  ?                          Tattooed. ?                          - Five 3 to 10 mm polyps in the transverse colon,  ?                          removed with a cold snare. Resected and retrieved. ?                          - Two 6 to 8 mm polyps in the transverse colon,  ?                          removed with a hot snare. Resected and retrieved. ?                          -  One 6 mm polyp in the descending colon, removed with a cold snare. Resected and retrieved. ?                          - Two 3 to 4 mm polyps in the sigmoid colon,  ?                          removed with a cold snare. Resected and retrieved. ?                          - One 15 mm polyp in the sigmoid colon, removed  ?                          with a hot snare. Resected and retrieved. ?                          - One 3 to 4 mm polyp at the recto-sigmoid colon,  ?                          removed with a cold snare. Resected and retrieved. ?                          - Internal hemorrhoids. ?                          - The examination was otherwise normal. ?                          Colon mass is likely malignant and the cause of  ?                          anemia / bleeding. ?Recommendation:- Return  patient to hospital ward for ongoing care. ?                          - Advance diet as tolerated. ?                          - Continue present medications. ?                          - Miralax BID to keep stools loose and prevent  ?                          impaction ?                          - Await pathology results. ?                          - Monitor Hgb ?                          - CT scan chest / abdomen / pelvis for staging.  ?  Will also need surgical consult ?                          - Will need to discuss findings with cardiology  ?                          service - high risk for bleeding with resumption of  ?                          anticoagulation / antiplatelet therapy until  ?                          definitive therapy of mass lesion ? ?Consultants: Cardiology/EP, oncology, GI, surgery ?Procedures performed: EGD, colonoscopy colectomy.  ?Disposition: Home ?Diet recommendation:  ?Discharge Diet Orders (From admission, onward)  ? ?  Start     Ordered  ? 04/24/22 0000  Diet Carb Modified       ? 04/24/22 1116  ? ?  ?  ? ?  ?DISCHARGE MEDICATION: ?Allergies as of 04/25/2022   ?No Known Allergies ?  ? ?  ?Medication List  ?  ? ?TAKE these medications   ? ?amiodarone 200 MG tablet ?Commonly known as: PACERONE ?Take 2 tablets (400 mg total) by mouth daily. ?What changed: how much to take ?  ?apixaban 5 MG Tabs tablet ?Commonly known as: Eliquis ?TAKE 1 TABLET(5 MG) BY MOUTH TWICE DAILY ?What changed:  ?how much to take ?how to take this ?when to take this ?additional instructions ?  ?atorvastatin 80 MG tablet ?Commonly known as: LIPITOR ?Take 1 tablet (80 mg total) by mouth every evening. ?  ?clopidogrel 75 MG tablet ?Commonly known as: PLAVIX ?TAKE 1 TABLET(75 MG) BY MOUTH DAILY ?What changed: See the new instructions. ?  ?ezetimibe 10 MG tablet ?Commonly known as: ZETIA ?Take 1 tablet (10 mg total) by mouth daily. ?  ?Farxiga 10 MG Tabs tablet ?Generic drug: dapagliflozin  propanediol ?TAKE 1 TABLET(10 MG) BY MOUTH DAILY BEFORE BREAKFAST ?What changed: See the new instructions. ?  ?furosemide 20 MG tablet ?Commonly known as: LASIX ?Take 2 tablets (40 mg total) by mouth 3 (three) ti

## 2022-04-25 NOTE — Progress Notes (Signed)
? ? ?7 Days Post-Op  ?Subjective: ?CC: ?Doing well. Stable abdominal pain that is well controlled. Tolerating diet without n/v. Passing flatus. BM today. TRH planning d/c.  ? ?Objective: ?Vital signs in last 24 hours: ?Temp:  [97.9 ?F (36.6 ?C)-98.5 ?F (36.9 ?C)] 98 ?F (36.7 ?C) (05/12 0848) ?Pulse Rate:  [60-80] 60 (05/12 0848) ?Resp:  [13-16] 16 (05/12 0848) ?BP: (100-122)/(72-83) 110/75 (05/12 0848) ?SpO2:  [93 %-98 %] 95 % (05/12 0848) ?Last BM Date : 04/25/22 ? ?Intake/Output from previous day: ?No intake/output data recorded. ?Intake/Output this shift: ?No intake/output data recorded. ? ?PE: ?Gen:  Alert, NAD, pleasant ?Pulm: Rate and effort normal ?Abd: Soft, ND, appropriately tender around incision - otherwise NT. +BS, incisions with glue intact appears well and are without drainage, bleeding, or signs of infection ? ?Lab Results:  ?Recent Labs  ?  04/23/22 ?0322  ?WBC 8.9  ?HGB 11.1*  ?HCT 36.9*  ?PLT 355  ? ?BMET ?Recent Labs  ?  04/23/22 ?0322  ?NA 137  ?K 3.9  ?CL 106  ?CO2 27  ?GLUCOSE 115*  ?BUN 9  ?CREATININE 0.78  ?CALCIUM 8.2*  ? ?PT/INR ?No results for input(s): LABPROT, INR in the last 72 hours. ?CMP  ?   ?Component Value Date/Time  ? NA 137 04/23/2022 0322  ? NA 137 04/11/2022 1219  ? K 3.9 04/23/2022 0322  ? CL 106 04/23/2022 0322  ? CO2 27 04/23/2022 0322  ? GLUCOSE 115 (H) 04/23/2022 0322  ? BUN 9 04/23/2022 0322  ? BUN 13 04/11/2022 1219  ? CREATININE 0.78 04/23/2022 0322  ? CREATININE 1.08 12/12/2016 1445  ? CALCIUM 8.2 (L) 04/23/2022 0322  ? PROT 5.7 (L) 04/21/2022 0950  ? PROT 6.5 02/28/2022 1304  ? ALBUMIN 2.3 (L) 04/21/2022 0950  ? ALBUMIN 4.0 02/28/2022 1304  ? AST 28 04/21/2022 0950  ? ALT 53 (H) 04/21/2022 0950  ? ALKPHOS 43 04/21/2022 0950  ? BILITOT 1.0 04/21/2022 0950  ? BILITOT 0.2 02/28/2022 1304  ? GFRNONAA >60 04/23/2022 0322  ? GFRAA 91 11/15/2020 0959  ? ?Lipase  ?   ?Component Value Date/Time  ? LIPASE 120 (H) 09/09/2020 1810  ? ? ?Studies/Results: ?No results  found. ? ?Anti-infectives: ?Anti-infectives (From admission, onward)  ? ? Start     Dose/Rate Route Frequency Ordered Stop  ? 04/18/22 1015  cefoTEtan (CEFOTAN) 2 g in sodium chloride 0.9 % 100 mL IVPB  Status:  Discontinued       ? 2 g ?200 mL/hr over 30 Minutes Intravenous Every 12 hours 04/18/22 1011 04/18/22 1416  ? ?  ? ? ? ?Assessment/Plan ?POD 7, s/p lap assisted R colectomy for adenocarcinoma, Dr. Thermon Leyland 04/18/22 ?- Path w/ Adenocarcinoma, moderate to poorly differentiated, 6.4 cm; Metastatic adenocarcinoma involving eleven of thirty-five lymph nodes  ?(11/35); Lymphovascular space invasion present; Margins uninvolved by carcinoma. Dr. Brantley Stage discussed with patient yesterday and he confirmed understanding of path today. Dr. Ernestina Penna team has seen and has reached out to arrange follow up ?- Doing well. Okay for d/c from our standpoint. Follow up arranged. Pain meds sent to pharmacy.  ?  ?FEN - Carb mod, IVF per TRH ?VTE - Eliquis & plavix. Hgb stable on labs 5/10 ?ID - Cefotetan periop. None currently.  ?  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 ? LOS:  13 days  ? ? ?Jillyn Ledger , PA-C ?[facility] ?04/25/2022, 10:56 AM ?Please see Amion for pager number during day hours 7:00am-4:30pm ? ?

## 2022-04-28 ENCOUNTER — Other Ambulatory Visit: Payer: Self-pay | Admitting: Neurology

## 2022-05-03 ENCOUNTER — Other Ambulatory Visit: Payer: Self-pay | Admitting: Cardiovascular Disease

## 2022-05-05 ENCOUNTER — Inpatient Hospital Stay: Payer: Commercial Managed Care - HMO

## 2022-05-05 ENCOUNTER — Inpatient Hospital Stay: Payer: Commercial Managed Care - HMO | Attending: Hematology | Admitting: Hematology

## 2022-05-05 ENCOUNTER — Encounter: Payer: Self-pay | Admitting: Hematology

## 2022-05-05 ENCOUNTER — Other Ambulatory Visit: Payer: Self-pay

## 2022-05-05 VITALS — BP 146/96 | HR 108 | Temp 97.9°F | Resp 19 | Ht 78.0 in

## 2022-05-05 DIAGNOSIS — D123 Benign neoplasm of transverse colon: Secondary | ICD-10-CM | POA: Insufficient documentation

## 2022-05-05 DIAGNOSIS — D500 Iron deficiency anemia secondary to blood loss (chronic): Secondary | ICD-10-CM | POA: Diagnosis not present

## 2022-05-05 DIAGNOSIS — C182 Malignant neoplasm of ascending colon: Secondary | ICD-10-CM | POA: Diagnosis not present

## 2022-05-05 DIAGNOSIS — I2510 Atherosclerotic heart disease of native coronary artery without angina pectoris: Secondary | ICD-10-CM | POA: Insufficient documentation

## 2022-05-05 DIAGNOSIS — I700 Atherosclerosis of aorta: Secondary | ICD-10-CM | POA: Insufficient documentation

## 2022-05-05 DIAGNOSIS — R42 Dizziness and giddiness: Secondary | ICD-10-CM | POA: Insufficient documentation

## 2022-05-05 DIAGNOSIS — E785 Hyperlipidemia, unspecified: Secondary | ICD-10-CM | POA: Diagnosis not present

## 2022-05-05 DIAGNOSIS — Z7901 Long term (current) use of anticoagulants: Secondary | ICD-10-CM | POA: Insufficient documentation

## 2022-05-05 DIAGNOSIS — N529 Male erectile dysfunction, unspecified: Secondary | ICD-10-CM | POA: Insufficient documentation

## 2022-05-05 DIAGNOSIS — G4730 Sleep apnea, unspecified: Secondary | ICD-10-CM | POA: Insufficient documentation

## 2022-05-05 DIAGNOSIS — D509 Iron deficiency anemia, unspecified: Secondary | ICD-10-CM

## 2022-05-05 DIAGNOSIS — D649 Anemia, unspecified: Secondary | ICD-10-CM

## 2022-05-05 DIAGNOSIS — K648 Other hemorrhoids: Secondary | ICD-10-CM | POA: Insufficient documentation

## 2022-05-05 DIAGNOSIS — D124 Benign neoplasm of descending colon: Secondary | ICD-10-CM | POA: Diagnosis not present

## 2022-05-05 DIAGNOSIS — Z79899 Other long term (current) drug therapy: Secondary | ICD-10-CM | POA: Diagnosis not present

## 2022-05-05 DIAGNOSIS — J90 Pleural effusion, not elsewhere classified: Secondary | ICD-10-CM | POA: Diagnosis not present

## 2022-05-05 DIAGNOSIS — D125 Benign neoplasm of sigmoid colon: Secondary | ICD-10-CM | POA: Insufficient documentation

## 2022-05-05 DIAGNOSIS — K922 Gastrointestinal hemorrhage, unspecified: Secondary | ICD-10-CM | POA: Insufficient documentation

## 2022-05-05 DIAGNOSIS — I255 Ischemic cardiomyopathy: Secondary | ICD-10-CM | POA: Insufficient documentation

## 2022-05-05 DIAGNOSIS — I4891 Unspecified atrial fibrillation: Secondary | ICD-10-CM | POA: Insufficient documentation

## 2022-05-05 DIAGNOSIS — I5042 Chronic combined systolic (congestive) and diastolic (congestive) heart failure: Secondary | ICD-10-CM | POA: Diagnosis not present

## 2022-05-05 DIAGNOSIS — I110 Hypertensive heart disease with heart failure: Secondary | ICD-10-CM | POA: Insufficient documentation

## 2022-05-05 DIAGNOSIS — R109 Unspecified abdominal pain: Secondary | ICD-10-CM | POA: Diagnosis not present

## 2022-05-05 LAB — CBC WITH DIFFERENTIAL/PLATELET
Abs Immature Granulocytes: 0.03 10*3/uL (ref 0.00–0.07)
Basophils Absolute: 0 10*3/uL (ref 0.0–0.1)
Basophils Relative: 0 %
Eosinophils Absolute: 0 10*3/uL (ref 0.0–0.5)
Eosinophils Relative: 0 %
HCT: 41.6 % (ref 39.0–52.0)
Hemoglobin: 12.7 g/dL — ABNORMAL LOW (ref 13.0–17.0)
Immature Granulocytes: 0 %
Lymphocytes Relative: 8 %
Lymphs Abs: 0.6 10*3/uL — ABNORMAL LOW (ref 0.7–4.0)
MCH: 24.8 pg — ABNORMAL LOW (ref 26.0–34.0)
MCHC: 30.5 g/dL (ref 30.0–36.0)
MCV: 81.3 fL (ref 80.0–100.0)
Monocytes Absolute: 1 10*3/uL (ref 0.1–1.0)
Monocytes Relative: 14 %
Neutro Abs: 5.4 10*3/uL (ref 1.7–7.7)
Neutrophils Relative %: 78 %
Platelets: 599 10*3/uL — ABNORMAL HIGH (ref 150–400)
RBC: 5.12 MIL/uL (ref 4.22–5.81)
RDW: 23.6 % — ABNORMAL HIGH (ref 11.5–15.5)
Smear Review: NORMAL
WBC Morphology: 6
WBC: 7 10*3/uL (ref 4.0–10.5)
nRBC: 0.4 % — ABNORMAL HIGH (ref 0.0–0.2)

## 2022-05-05 LAB — FERRITIN: Ferritin: 278 ng/mL (ref 24–336)

## 2022-05-05 LAB — SAMPLE TO BLOOD BANK

## 2022-05-05 LAB — VITAMIN B12: Vitamin B-12: 674 pg/mL (ref 180–914)

## 2022-05-05 MED ORDER — OXYCODONE HCL 5 MG PO TABS: 5.0000 mg | ORAL_TABLET | Freq: Three times a day (TID) | ORAL | 0 refills | Status: DC | PRN

## 2022-05-05 NOTE — Progress Notes (Signed)
I met with Mr Gregory [name] and his son, Gregory [name] before his consultation with Dr Burr Medico.  I explained my role as a nurse navigator and provided my contact information. I explained the services provided at [facility] and provided written information. I provided Zayaan with our [facility] department email.  I explained the alight grant and let  them know one of the financial advisors will reach out to  him at the time of his chemo education class.  I briefly explained insertion and care of a port a cath.  I showed a sample of the port a cath. I told him that he will be scheduled for chemotherapy education class prior to receiving chemotherapy.  All questions were answered. They verbalized understanding.

## 2022-05-05 NOTE — Progress Notes (Addendum)
[place]   [phone] [phone]   Clinic Follow up Note   Patient Care Team: Patient, [name] as PCP - General (General Practice) [name], [name], MD as PCP - Cardiology (Cardiology) Gregory Sprang, MD as PCP - Electrophysiology (Cardiology) Gregory Sprang, MD as Consulting Physician (Neurology) Gregory Merle, MD as Consulting Physician (Hematology)  Date of Service:  05/05/2022  CHIEF COMPLAINT: f/u of colon cancer  CURRENT THERAPY:  PENDING  ASSESSMENT & PLAN:  Gregory [name] is a 61 y.o. male with   1. 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 -initially referred to ED on 04/11/22 for severe anemia with hemoglobin around 4. FOBT test positive. Colonoscopy on 04/16/22 with Dr. Havery Moros confirmed adenocarcinoma. -s/p resection on 04/18/22 by Dr. Thermon Leyland, path showed: 6.4 cm moderate to poorly differentiated adenocarcinoma; 11/35 nodes were positive; lymphovascular space invasion present; margins negative. MMR normal. Of note, appendix was uninvolved.  -I discussed the work up and results thus far with them today.  The CT scan from May 13, 2022 showed bilateral adrenal nodules and multiple hypervascular lesions in the pancreas, they are indeterminate, but concerning for metastatic disease. -I recommend obtaining a PET scan to determine if he has metastatic disease. I discussed we may obtain biopsies to confirm cancer if PET scan is suspicious.   -I reviewed that he has at least locally advanced cancer, if PET scan is negative for distant metastasis. I discussed that he has a high risk of recurrence (over 50%), the standard therapy and recommendation would be for adjuvant chemotherapy. I discussed the different regimens. Given his current performance status and comorbidities, I recommend starting with oral Xeloda. If he improves, we can consider adding IV  oxaliplatin, either FOLFOX or CAPOX.  Due to his significant cardiac comorbidities, if his performance status does not improve, he may not be a candidate for combined chemotherapy.  2. Anemia from GI bleeding and iron deficiency -secondary to #1 -he received IV Feraheme and blood transfusion during hospitalization -will obtain labs today to see how he has responded.  3.  CAD, A-fib, CHF with EF 20%, status post AICD placement -Follow-up with cardiology -He is on multiple cardiac medications, will continue  PLAN: -lab today to repeat a CBC and iron study, to see if he needs additional IV iron -PET scan in 2 weeks -lab and f/u on 6/14 -I gave him a week supply of oxycodone for his surgical incision related abdominal pain, future refill will defer to his surgeon Dr. Thermon Leyland   No problem-specific Assessment & Plan notes found for this encounter.   SUMMARY OF ONCOLOGIC HISTORY: Oncology History Overview Note   Cancer Staging  Cancer of right colon [facility]) Staging form: Colon and Rectum, AJCC 8th Edition - Pathologic stage from 04/22/2022: pT3, pN2b, cM1 - Signed by Gregory Merle, MD on 05/05/2022    Cancer of right colon ([place])  04/12/2022 Miscellaneous   Fecal Occult Bld - POSITIVE   04/16/2022 Imaging   EXAM: CT CHEST, ABDOMEN, AND PELVIS WITH CONTRAST  IMPRESSION: 1. 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 2. Bilateral adrenal nodules which are indeterminate, but potentially metastatic. 3. Multiple hypovascular lesions scattered throughout  the pancreas. The possibility of metastatic disease to the pancreas should be considered. Alternatively, benign etiologies such as chronic  pancreatic pseudocysts could have a similar appearance (no recent prior studies are available for comparison). Close attention on follow-up studies is recommended to ensure the stability or regression of these pancreatic lesions. 4. Small to moderate left pleural effusion lying dependently with some passive subsegmental atelectasis in the left lower lobe. 5. Cardiomegaly with left atrial dilatation. 6. Aortic atherosclerosis, in addition to left main and three-vessel coronary artery disease. Please note that although the presence of coronary artery calcium documents the presence of coronary artery disease, the severity of this disease and any potential stenosis cannot be assessed on this non-gated CT examination. Assessment for potential risk factor modification, dietary therapy or pharmacologic therapy may be warranted, if clinically indicated. 7. There are calcifications of the aortic valve. Echocardiographic correlation for evaluation of potential valvular dysfunction may be warranted if clinically indicated. 8. Additional incidental findings, as above.   04/16/2022 Procedure   Colonoscopy; Dr. Havery Moros  Impression: -Stool in the entire examined colon, several minutes spent lavaging the colon - Likely malignant partially obstructing tumor in the suspected proximal transverse colon vs. right colon as above - could not traverse it. Biopsied. Tattooed. - Five 3 to 10 mm polyps in the transverse colon, removed with a cold snare. Resected and retrieved. - Two 6 to 8 mm polyps in the transverse colon, removed with a hot snare. Resected and retrieved. - One 6 mm polyp in the descending colon, removed with a cold snare. Resected and retrieved. - Two 3 to 4 mm polyps in the sigmoid colon, removed with a cold snare. Resected and retrieved. - One 15 mm polyp in the sigmoid colon, removed with a hot snare. Resected and retrieved. - One 3 to 4 mm polyp at the recto-sigmoid colon, removed with a cold  snare. Resected and retrieved. - Internal hemorrhoids. - The examination was otherwise normal.  Colon mass is likely malignant and the cause of anemia / bleeding.   04/16/2022 Initial Biopsy   FINAL MICROSCOPIC DIAGNOSIS:   A. COLON MASS, BIOPSY:  - Moderately differentiated colonic adenocarcinoma.  - See comment.   B. COLON, TRANSVERSE, DESCENDING AND SIGMOID, POLYPECTOMY:  - Tubular adenoma (s) without high grade dysplasia.  - Sessile serrated adenoma(s) without cytologic dysplasia.   C. COLON, SIGMOID, POLYPECTOMY:  - Tubular adenoma without high grade dysplasia.    04/17/2022 Initial Diagnosis   Cancer of right colon ([place])    04/18/2022 Def[facility] Surgery   FINAL MICROSCOPIC DIAGNOSIS:   A. COLON, RIGHT, RESECTION:  -  Adenocarcinoma, moderate to poorly differentiated, 6.4 cm  -  Metastatic adenocarcinoma involving eleven of thirty-five lymph nodes (11/35)  -  Lymphovascular space invasion present  -  Margins uninvolved by carcinoma  -  Multiple tubular adenomas  -  Appendix uninvolved by adenocarcinoma  -  See oncology table and comment below   ADDENDUM:  Mismatch Repair Protein ([facility])  SUMMARY INTERPRETATION: NORMAL   04/22/2022 Cancer Staging   Staging form: Colon and Rectum, AJCC 8th Edition - Pathologic stage from 04/22/2022: pT3, pN2b, cM1 - Signed by Gregory Merle, MD on 05/05/2022 Stage prefix: Initial diagnosis Histologic grading system: 4 grade system Histologic grade (G): G3 Residual tumor (R): R0 - None       INTERVAL HISTORY:  [facility] is here for a follow up of colon cancer. He was last seen by me on 04/17/22 while he was in the  hospital. He presents to the clinic accompanied by his son, who is currently living with him.  He reports today he is feeling "like I did when I went to the hospital." He reports light-headedness. He does note he has not eaten much today but is more concerned about anemia.  He notes he has difficulty climbing stair but  is otherwise able to do things on his own. He reports some shortness of breath with exertion; he explains he can walk about 30 ft before needing to stop. He also notes some pain to his surgical site; he reports the pain can get up to a 10/10. He notes he has not had much of an appetite.  He previously smoked, quit "years ago." He reports prior heavy drinking for 40 years, stopped a few months ago.  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   All other systems were reviewed with the patient and are negative.  MEDICAL HISTORY:  Past Medical History:  Diagnosis Date   AICD (automatic cardioverter/defibrillator) present 2005   CAD (coronary artery disease) 12/01/2013   Chronic combined systolic and diastolic CHF, NYHA class 1 ([place]) 12/01/2013   Erectile dysfunction 12/01/2013   HTN (hypertension) 12/01/2013   Hyperlipidemia 12/01/2013   Ischemic cardiomyopathy 12/01/2013   Presence of permanent cardiac pacemaker    Sleep apnea     SURGICAL HISTORY: Past Surgical History:  Procedure Laterality Date   ACHILLES TENDON REPAIR     lft foot   BIOPSY  04/16/2022   Procedure: BIOPSY;  Surgeon: Yetta Flock, MD;  Location: [place];  Service: Gastroenterology;;   CARDIAC CATHETERIZATION N/A 05/05/2016   Procedure: Left Heart Cath and Coronary Angiography;  Surgeon: Leonie Man, MD;  Location: [place] CV LAB;  Service: Cardiovascular;  Laterality: N/A;   CARDIAC DEFIBRILLATOR PLACEMENT     CARDIOVERSION N/A 09/13/2020   Procedure: CARDIOVERSION;  Surgeon: Werner Lean, MD;  Location: [place] ENDOSCOPY;  Service: Cardiovascular;  Laterality: N/A;   CARDIOVERSION N/A 09/19/2020   Procedure: CARDIOVERSION;  Surgeon: Gregory Sprang, MD;  Location: [facility] ENDOSCOPY;  Service: Cardiovascular;  Laterality: N/A;   COLON RESECTION N/A 04/18/2022   Procedure: HAND ASSISTED LAPAROSCOPIC RIGHT COLON RESECTION;  Surgeon: Felicie Morn, MD;   Location: [place];  Service: General;  Laterality: N/A;   COLONOSCOPY WITH PROPOFOL N/A 04/16/2022   Procedure: COLONOSCOPY WITH PROPOFOL;  Surgeon: Yetta Flock, MD;  Location: [place];  Service: Gastroenterology;  Laterality: N/A;   EP IMPLANTABLE DEVICE N/A 12/19/2016   Procedure: ICD Generator Changeout;  Surgeon: Gregory Sprang, MD;  Location: [name] [facility];  Service: Cardiovascular;  Laterality: N/A;   ESOPHAGOGASTRODUODENOSCOPY N/A 04/16/2022   Procedure: ESOPHAGOGASTRODUODENOSCOPY (EGD);  Surgeon: Yetta Flock, MD;  Location: [facility] ENDOSCOPY;  Service: Gastroenterology;  Laterality: N/A;   LEFT HEART CATH AND CORONARY ANGIOGRAPHY N/A 04/17/2022   Procedure: LEFT HEART CATH AND CORONARY ANGIOGRAPHY;  Surgeon: Belva Crome, MD;  Location: [place] [facility];  Service: Cardiovascular;  Laterality: N/A;   POLYPECTOMY  04/16/2022   Procedure: POLYPECTOMY;  Surgeon: Yetta Flock, MD;  Location: [facility] ENDOSCOPY;  Service: Gastroenterology;;   RIGHT/LEFT HEART CATH AND CORONARY ANGIOGRAPHY N/A 09/24/2020   Procedure: RIGHT/LEFT HEART CATH AND CORONARY ANGIOGRAPHY;  Surgeon: Lorretta Harp, MD;  Location: [place] [facility];  Service: Cardiovascular;  Laterality: N/A;   SUBMUCOSAL TATTOO INJECTION  04/16/2022   Procedure: SUBMUCOSAL TATTOO INJECTION;  Surgeon: Havery Moros,  Carlota Raspberry, MD;  Location: [facility] ENDOSCOPY;  Service: Gastroenterology;;   WRIST TENODESIS      I have reviewed the social history and family history with the patient and they are unchanged from previous note.  ALLERGIES:  has No Known Allergies.  MEDICATIONS:  Current Outpatient Medications  Medication Sig Dispense Refill   amiodarone (PACERONE) 200 MG tablet Take 2 tablets (400 mg total) by mouth daily. 60 tablet 0   apixaban (ELIQUIS) 5 MG TABS tablet TAKE 1 TABLET(5 MG) BY MOUTH TWICE DAILY (Patient taking differently: Take 5 mg by mouth 2 (two) times daily.) 180 tablet 1   atorvastatin (LIPITOR) 80 MG  tablet TAKE ONE TABLET BY MOUTH EVERY EVENING 90 tablet 3   clopidogrel (PLAVIX) 75 MG tablet TAKE 1 TABLET(75 MG) BY MOUTH DAILY (Patient taking differently: Take 75 mg by mouth daily. TAKE 1 TABLET(75 MG) BY MOUTH DAILY) 90 tablet 2   ezetimibe (ZETIA) 10 MG tablet Take 1 tablet (10 mg total) by mouth daily. 90 tablet 3   FARXIGA 10 MG TABS tablet TAKE 1 TABLET(10 MG) BY MOUTH DAILY BEFORE BREAKFAST (Patient taking differently: Take 10 mg by mouth daily.) 30 tablet 5   furosemide (LASIX) 20 MG tablet Take 2 tablets (40 mg total) by mouth 3 (three) times a week. (Patient taking differently: Take 40 mg by mouth every Monday, Wednesday, and Friday.) 80 tablet 1   levETIRAcetam (KEPPRA) 500 MG tablet TAKE 1 TABLET(500 MG) BY MOUTH TWICE DAILY 60 tablet 3   metoprolol succinate (TOPROL-XL) 25 MG 24 hr tablet Take one tablet, 25 mg, in the morning and 50 mg, two tablets, in the evening. (Patient taking differently: 25 mg. Per patient taking 25 mg in the morning and 25 mg at night) 135 tablet 3   oxyCODONE (OXY IR/ROXICODONE) 5 MG immediate release tablet Take 1-2 tablets (5-10 mg total) by mouth every 8 (eight) hours as needed for breakthrough pain. 15 tablet 0   pantoprazole (PROTONIX) 40 MG tablet TAKE 1 TABLET(40 MG) BY MOUTH DAILY (Patient taking differently: Take 40 mg by mouth daily. TAKE 1 TABLET(40 MG) BY MOUTH DAILY) 30 tablet 2   potassium chloride (KLOR-CON M) 10 MEQ tablet Take 1 tablet (10 mEq total) by mouth daily. 30 tablet 6   sacubitril-valsartan (ENTRESTO) 49-51 MG Take 1 tablet by mouth 2 (two) times daily. 180 tablet 1   sildenafil (VIAGRA) 50 MG tablet Take 1 tablet (50 mg total) by mouth as needed for erectile dysfunction. 30 tablet 4   No current facility-administered medications for this visit.    PHYSICAL EXAMINATION: ECOG PERFORMANCE STATUS: 3 - Symptomatic, >50% confined to bed  Vitals:   05/05/22 1516  BP: (!) 146/96  Pulse: (!) 108  Resp: 19  Temp: 97.9 F (36.6 C)   SpO2: 96%   Wt Readings from Last 3 Encounters:  04/18/22 211 lb (95.7 kg)  03/21/22 219 lb 6.4 oz (99.5 kg)  02/27/22 222 lb 6.4 oz (100.9 kg)     GENERAL:alert, no distress and comfortable SKIN: skin color, texture, turgor are normal, no rashes or significant lesions EYES: normal, Conjunctiva are pink and non-injected, sclera clear  NECK: supple, thyroid normal size, non-tender, without nodularity LYMPH:  no palpable lymphadenopathy in the cervical, axillary  LUNGS: clear to auscultation and percussion with normal breathing effort HEART: regular rate & rhythm and no murmurs and no lower extremity edema ABDOMEN:abdomen soft, non-tender and normal bowel sounds Musculoskeletal:no cyanosis of digits and no clubbing  NEURO: alert &  oriented x 3 with fluent speech, no focal motor/sensory deficits  LABORATORY DATA:  I have reviewed the data as listed    Latest Ref Rng & Units 04/23/2022    3:22 AM 04/22/2022    2:09 AM 04/20/2022    1:06 AM  CBC  WBC 4.0 - 10.5 K/uL 8.9   11.1   10.5    Hemoglobin 13.0 - 17.0 g/dL 11.1   10.8   10.4    Hematocrit 39.0 - 52.0 % 36.9   36.0   34.9    Platelets 150 - 400 K/uL 355   304   234          Latest Ref Rng & Units 04/23/2022    3:22 AM 04/22/2022    2:09 AM 04/21/2022    9:50 AM  CMP  Glucose 70 - 99 mg/dL 115   91   101    BUN 8 - 23 mg/dL '9   8   8    ' Creatinine 0.61 - 1.24 mg/dL 0.78   0.87   0.81    Sodium 135 - 145 mmol/L 137   137   139    Potassium 3.5 - 5.1 mmol/L 3.9   3.6   4.2    Chloride 98 - 111 mmol/L 106   105   107    CO2 22 - 32 mmol/L '27   26   26    ' Calcium 8.9 - 10.3 mg/dL 8.2   8.2   8.2    Total Protein 6.5 - 8.1 g/dL   5.7    Total Bilirubin 0.3 - 1.2 mg/dL   1.0    Alkaline Phos 38 - 126 U/L   43    AST 15 - 41 U/L   28    ALT 0 - 44 U/L   53        RADIOGRAPHIC STUDIES: I have personally reviewed the radiological images as listed and agreed with the findings in the report. No results found.    Orders  Placed This Encounter  Procedures   NM PET Image Initial (PI) Skull Base To Thigh    Standing Status:   Future    Standing Expiration Date:   05/05/2023    Order Specific Question:   If indicated for the ordered procedure, I authorize the administration of a radiopharmaceutical per Radiology protocol    Answer:   Yes    Order Specific Question:   Preferred imaging location?    Answer:   [place]   CBC with Differential/Platelet    Standing Status:   Standing    Number of Occurrences:   50    Standing Expiration Date:   05/06/2023   Ferritin    Standing Status:   Standing    Number of Occurrences:   20    Standing Expiration Date:   05/06/2023   Vitamin B12    Standing Status:   Future    Number of Occurrences:   1    Standing Expiration Date:   05/06/2023   Folate RBC    Standing Status:   Future    Number of Occurrences:   1    Standing Expiration Date:   05/06/2023   Iron and TIBC    Standing Status:   Future    Standing Expiration Date:   05/05/2023   Sample to Blood Bank    Standing Status:   Future    Number of Occurrences:   1  Standing Expiration Date:   05/06/2023   All questions were answered. The patient knows to call the clinic with any problems, questions or concerns. No barriers to learning was detected. The total time spent in the appointment was 45 minutes.     Gregory Merle, MD 05/05/2022   I, Wilburn Mylar, am acting as scribe for Gregory Merle, MD.   I have reviewed the above documentation for accuracy and completeness, and I agree with the above.

## 2022-05-06 ENCOUNTER — Encounter: Payer: Self-pay | Admitting: *Deleted

## 2022-05-06 ENCOUNTER — Telehealth: Payer: Self-pay | Admitting: Internal Medicine

## 2022-05-06 ENCOUNTER — Telehealth: Payer: Self-pay | Admitting: Cardiovascular Disease

## 2022-05-06 ENCOUNTER — Encounter: Payer: Self-pay | Admitting: Licensed Clinical Social Worker

## 2022-05-06 DIAGNOSIS — C182 Malignant neoplasm of ascending colon: Secondary | ICD-10-CM

## 2022-05-06 LAB — IRON AND IRON BINDING CAPACITY (CC-WL,HP ONLY)
Iron: 15 ug/dL — ABNORMAL LOW (ref 45–182)
Saturation Ratios: 8 % — ABNORMAL LOW (ref 17.9–39.5)
TIBC: 188 ug/dL — ABNORMAL LOW (ref 250–450)
UIBC: 173 ug/dL (ref 117–376)

## 2022-05-06 LAB — FOLATE RBC
Folate, Hemolysate: 510 ng/mL
Folate, RBC: 1175 ng/mL (ref >498)
Hematocrit: 43.4 % (ref 37.5–51.0)

## 2022-05-06 NOTE — Telephone Encounter (Signed)
STAT if HR is under 50 or over 120 (normal HR is 60-100 beats per minute)  What is your heart rate? 112  Do you have a log of your heart rate readings (document readings)? Got up to 137 on 05/03/22  Do you have any other symptoms? No     Patient has been having tachycardia since over the weekend. Reports he was trying to send in a transmission due to it, but is unable to and is having a new monitor sent. Patient stated he had a little SOB over the weekend when tachycardia occurred, but it was not heavy.

## 2022-05-06 NOTE — Telephone Encounter (Signed)
Called pt. He states his heart rate has not gotten back up to 137 since 5/20 but it still has been racing. Pt states he did not get a tracking number for his new monitor and is wondering if we can track it. It also wants to know does he need to come into the office to be seen. Will get message to Dr. Caryl Comes for review.

## 2022-05-06 NOTE — Telephone Encounter (Signed)
  1. Has your device fired? No   2. Is you device beeping? Yes, gave an error code  3. Are you experiencing draining or swelling at device site? No   4. Are you calling to see if we received your device transmission? No   5. Have you passed out? No  Patient states he spoke with his device company and they are having to send him a new monitor before another transmission can be submitted.     Please route to [place]

## 2022-05-06 NOTE — Progress Notes (Signed)
[facility] Clinical Social Work  Initial Assessment   Gregory [name] is a 61 y.o. year old male accompanied by patient and son. Clinical Social Work was referred by medical provider for assessment of psychosocial needs.   SDOH (Social Determinants of Health) assessments performed: Yes   SDOH Screenings   Alcohol Screen: Not on file  Depression (PHQ2-9): Not on file  Financial Resource Strain: Not on file  Food Insecurity: Not on file  Housing: Not on file  Physical Activity: Not on file  Social Connections: Not on file  Stress: Not on file  Tobacco Use: Medium Risk   Smoking Tobacco Use: Former   Smokeless Tobacco Use: Never   Passive Exposure: Not on file  Transportation Needs: Not on file     Distress Screen completed: No     View : No data to display.            Family/Social Information:  Housing Arrangement: Patient lives with his son, Norm.  [place] submitted FMLA paperwork to ensure availability to care for his father. Family members/support persons in your life? 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 yes Concerns about diagnosis and/or treatment: Quality of life Patient reported stressors: Veterinary surgeon and/or priorities: Pt's priority is to begin treatment if appropriate as he is extremely weak from surgery, with the hope of positive results Patient enjoys  not addressed Current coping skills/ strengths: Supportive family/friends     SUMMARY: Current SDOH Barriers:  Financial constraints related to fixed income  Clinical Social Work Clinical  Goal(s):  Explore community resource options for unmet needs related to:  Financial Strain   Interventions: Discussed common feeling and emotions when being diagnosed with cancer, and the importance of support during treatment Informed patient of the support team roles and support services at [facility] Provided [place] contact information and encouraged patient to call with any questions or concerns Referred patient to financial counselor and provided pt w/ community and grant resources to assist w/ financial concerns.   Follow Up Plan: Patient will contact CSW with any support or resource needs Patient verbalizes understanding of plan: Yes    Henriette Combs, LCSW

## 2022-05-06 NOTE — Telephone Encounter (Signed)
Attempted phone call to pt and left voicemail message to contact office at [phone]. 

## 2022-05-06 NOTE — Progress Notes (Signed)
I spoke with Gregory [name] and relayed Dr Ernestina Penna comments regarding his Iron level and anemia.  I let him know that the radiology schedulers will call him to schedule his PET scan.  All questions were answered.  He verbalized understanding.

## 2022-05-07 NOTE — Telephone Encounter (Signed)
Pt returned call and states HR today is 92 and he is without symptoms.  Pt states ICD has not fired.  He has not yet received his monitor from [employer].  RN advised pt she is checking on monitor status with [employer] rep.  Pt decines coming in for an appointment today and states he will wait on monitor.  Pt thanked Therapist, sports for the phone call,

## 2022-05-07 NOTE — Telephone Encounter (Signed)
Spoke with pt and advised per [employer] rep pt's monitor should arrive tomorrow by [employer].   Pt states his pulse has been fluctuating today from the 50's to 112.  Pt denies current symptoms of CP, SOB or dizziness.  He is unable to tell if his heart is beating out of rhythm.  He states he does have some swelling in his left ankle.  Pt is taking Furosemide '40mg'$  - three times weekly.  He states he has been drinking Gatorade.  Pt advised to stop Gatorade as the extra sodium could be contributing to his swelling.  Pt advised to elevate feet and legs when sitting.  He will send a transmission tomorrow as soon as he receives his monitor.  Reviewed ED precautions.  Pt verbalizes understanding and agrees with current plan

## 2022-05-07 NOTE — Telephone Encounter (Signed)
Attempted phone call to pt.  Call sent straight to voicemail.  Did not leave another voicemail this morning.  Will attempt to call again at a later time.

## 2022-05-08 ENCOUNTER — Emergency Department (HOSPITAL_COMMUNITY): Payer: Commercial Managed Care - HMO

## 2022-05-08 ENCOUNTER — Telehealth: Payer: Self-pay

## 2022-05-08 ENCOUNTER — Inpatient Hospital Stay (HOSPITAL_COMMUNITY)
Admission: EM | Admit: 2022-05-08 | Discharge: 2022-05-22 | DRG: 856 | Disposition: A | Discharge status: Rehabilitation Facility | Payer: Commercial Managed Care - HMO | Attending: Family Medicine | Admitting: Family Medicine

## 2022-05-08 ENCOUNTER — Other Ambulatory Visit: Payer: Self-pay

## 2022-05-08 ENCOUNTER — Encounter (HOSPITAL_COMMUNITY): Payer: Self-pay

## 2022-05-08 ENCOUNTER — Inpatient Hospital Stay (HOSPITAL_COMMUNITY): Payer: Commercial Managed Care - HMO

## 2022-05-08 DIAGNOSIS — M7989 Other specified soft tissue disorders: Secondary | ICD-10-CM | POA: Diagnosis not present

## 2022-05-08 DIAGNOSIS — C772 Secondary and unspecified malignant neoplasm of intra-abdominal lymph nodes: Secondary | ICD-10-CM | POA: Diagnosis present

## 2022-05-08 DIAGNOSIS — I110 Hypertensive heart disease with heart failure: Secondary | ICD-10-CM | POA: Diagnosis present

## 2022-05-08 DIAGNOSIS — B954 Other streptococcus as the cause of diseases classified elsewhere: Secondary | ICD-10-CM | POA: Diagnosis present

## 2022-05-08 DIAGNOSIS — I4891 Unspecified atrial fibrillation: Secondary | ICD-10-CM | POA: Diagnosis present

## 2022-05-08 DIAGNOSIS — E785 Hyperlipidemia, unspecified: Secondary | ICD-10-CM | POA: Diagnosis present

## 2022-05-08 DIAGNOSIS — K651 Peritoneal abscess: Secondary | ICD-10-CM | POA: Diagnosis present

## 2022-05-08 DIAGNOSIS — K9189 Other postprocedural complications and disorders of digestive system: Secondary | ICD-10-CM | POA: Diagnosis present

## 2022-05-08 DIAGNOSIS — T8144XA Sepsis following a procedure, initial encounter: Secondary | ICD-10-CM | POA: Diagnosis present

## 2022-05-08 DIAGNOSIS — I2510 Atherosclerotic heart disease of native coronary artery without angina pectoris: Secondary | ICD-10-CM | POA: Diagnosis present

## 2022-05-08 DIAGNOSIS — Y838 Other surgical procedures as the cause of abnormal reaction of the patient, or of later complication, without mention of misadventure at the time of the procedure: Secondary | ICD-10-CM | POA: Diagnosis present

## 2022-05-08 DIAGNOSIS — K567 Ileus, unspecified: Secondary | ICD-10-CM

## 2022-05-08 DIAGNOSIS — Z95810 Presence of automatic (implantable) cardiac defibrillator: Secondary | ICD-10-CM

## 2022-05-08 DIAGNOSIS — G40909 Epilepsy, unspecified, not intractable, without status epilepticus: Secondary | ICD-10-CM | POA: Diagnosis present

## 2022-05-08 DIAGNOSIS — E669 Obesity, unspecified: Secondary | ICD-10-CM | POA: Diagnosis present

## 2022-05-08 DIAGNOSIS — Z808 Family history of malignant neoplasm of other organs or systems: Secondary | ICD-10-CM

## 2022-05-08 DIAGNOSIS — E119 Type 2 diabetes mellitus without complications: Secondary | ICD-10-CM | POA: Diagnosis present

## 2022-05-08 DIAGNOSIS — A419 Sepsis, unspecified organism: Secondary | ICD-10-CM | POA: Diagnosis present

## 2022-05-08 DIAGNOSIS — I10 Essential (primary) hypertension: Secondary | ICD-10-CM | POA: Diagnosis not present

## 2022-05-08 DIAGNOSIS — Y848 Other medical procedures as the cause of abnormal reaction of the patient, or of later complication, without mention of misadventure at the time of the procedure: Secondary | ICD-10-CM | POA: Diagnosis not present

## 2022-05-08 DIAGNOSIS — C182 Malignant neoplasm of ascending colon: Secondary | ICD-10-CM | POA: Diagnosis present

## 2022-05-08 DIAGNOSIS — M62830 Muscle spasm of back: Secondary | ICD-10-CM | POA: Diagnosis present

## 2022-05-08 DIAGNOSIS — Z8249 Family history of ischemic heart disease and other diseases of the circulatory system: Secondary | ICD-10-CM

## 2022-05-08 DIAGNOSIS — I728 Aneurysm of other specified arteries: Secondary | ICD-10-CM | POA: Diagnosis present

## 2022-05-08 DIAGNOSIS — T801XXA Vascular complications following infusion, transfusion and therapeutic injection, initial encounter: Secondary | ICD-10-CM | POA: Diagnosis not present

## 2022-05-08 DIAGNOSIS — Z955 Presence of coronary angioplasty implant and graft: Secondary | ICD-10-CM

## 2022-05-08 DIAGNOSIS — D649 Anemia, unspecified: Secondary | ICD-10-CM | POA: Diagnosis not present

## 2022-05-08 DIAGNOSIS — D72829 Elevated white blood cell count, unspecified: Secondary | ICD-10-CM

## 2022-05-08 DIAGNOSIS — R066 Hiccough: Secondary | ICD-10-CM | POA: Diagnosis not present

## 2022-05-08 DIAGNOSIS — Z9049 Acquired absence of other specified parts of digestive tract: Secondary | ICD-10-CM

## 2022-05-08 DIAGNOSIS — I5042 Chronic combined systolic (congestive) and diastolic (congestive) heart failure: Secondary | ICD-10-CM | POA: Diagnosis present

## 2022-05-08 DIAGNOSIS — I808 Phlebitis and thrombophlebitis of other sites: Secondary | ICD-10-CM | POA: Diagnosis not present

## 2022-05-08 DIAGNOSIS — R609 Edema, unspecified: Secondary | ICD-10-CM | POA: Diagnosis not present

## 2022-05-08 DIAGNOSIS — B9620 Unspecified Escherichia coli [E. coli] as the cause of diseases classified elsewhere: Secondary | ICD-10-CM | POA: Diagnosis present

## 2022-05-08 DIAGNOSIS — M79601 Pain in right arm: Secondary | ICD-10-CM | POA: Diagnosis not present

## 2022-05-08 DIAGNOSIS — M545 Low back pain, unspecified: Secondary | ICD-10-CM | POA: Diagnosis not present

## 2022-05-08 DIAGNOSIS — R5381 Other malaise: Secondary | ICD-10-CM | POA: Diagnosis not present

## 2022-05-08 DIAGNOSIS — I493 Ventricular premature depolarization: Secondary | ICD-10-CM | POA: Diagnosis present

## 2022-05-08 DIAGNOSIS — E8809 Other disorders of plasma-protein metabolism, not elsewhere classified: Secondary | ICD-10-CM | POA: Diagnosis not present

## 2022-05-08 DIAGNOSIS — I255 Ischemic cardiomyopathy: Secondary | ICD-10-CM | POA: Diagnosis present

## 2022-05-08 DIAGNOSIS — R569 Unspecified convulsions: Secondary | ICD-10-CM | POA: Diagnosis not present

## 2022-05-08 DIAGNOSIS — I82622 Acute embolism and thrombosis of deep veins of left upper extremity: Secondary | ICD-10-CM | POA: Diagnosis present

## 2022-05-08 DIAGNOSIS — N529 Male erectile dysfunction, unspecified: Secondary | ICD-10-CM | POA: Diagnosis present

## 2022-05-08 DIAGNOSIS — Z79899 Other long term (current) drug therapy: Secondary | ICD-10-CM

## 2022-05-08 DIAGNOSIS — I480 Paroxysmal atrial fibrillation: Secondary | ICD-10-CM | POA: Diagnosis present

## 2022-05-08 DIAGNOSIS — I472 Ventricular tachycardia, unspecified: Secondary | ICD-10-CM | POA: Diagnosis present

## 2022-05-08 DIAGNOSIS — M5412 Radiculopathy, cervical region: Secondary | ICD-10-CM | POA: Diagnosis not present

## 2022-05-08 DIAGNOSIS — M79602 Pain in left arm: Secondary | ICD-10-CM | POA: Diagnosis not present

## 2022-05-08 DIAGNOSIS — Z8679 Personal history of other diseases of the circulatory system: Secondary | ICD-10-CM | POA: Diagnosis not present

## 2022-05-08 DIAGNOSIS — M25511 Pain in right shoulder: Secondary | ICD-10-CM | POA: Diagnosis not present

## 2022-05-08 DIAGNOSIS — G4733 Obstructive sleep apnea (adult) (pediatric): Secondary | ICD-10-CM | POA: Diagnosis present

## 2022-05-08 DIAGNOSIS — B356 Tinea cruris: Secondary | ICD-10-CM | POA: Diagnosis not present

## 2022-05-08 DIAGNOSIS — R339 Retention of urine, unspecified: Secondary | ICD-10-CM | POA: Diagnosis present

## 2022-05-08 DIAGNOSIS — T8143XA Infection following a procedure, organ and space surgical site, initial encounter: Secondary | ICD-10-CM | POA: Diagnosis not present

## 2022-05-08 DIAGNOSIS — Z87891 Personal history of nicotine dependence: Secondary | ICD-10-CM

## 2022-05-08 DIAGNOSIS — E039 Hypothyroidism, unspecified: Secondary | ICD-10-CM | POA: Diagnosis present

## 2022-05-08 DIAGNOSIS — I25810 Atherosclerosis of coronary artery bypass graft(s) without angina pectoris: Secondary | ICD-10-CM | POA: Diagnosis not present

## 2022-05-08 DIAGNOSIS — Z9861 Coronary angioplasty status: Secondary | ICD-10-CM | POA: Diagnosis not present

## 2022-05-08 DIAGNOSIS — Z6826 Body mass index (BMI) 26.0-26.9, adult: Secondary | ICD-10-CM

## 2022-05-08 LAB — CBC WITH DIFFERENTIAL/PLATELET
Abs Immature Granulocytes: 0.07 10*3/uL (ref 0.00–0.07)
Basophils Absolute: 0 10*3/uL (ref 0.0–0.1)
Basophils Relative: 0 %
Eosinophils Absolute: 0 10*3/uL (ref 0.0–0.5)
Eosinophils Relative: 0 %
HCT: 48.5 % (ref 39.0–52.0)
Hemoglobin: 14.3 g/dL (ref 13.0–17.0)
Immature Granulocytes: 1 %
Lymphocytes Relative: 3 %
Lymphs Abs: 0.4 10*3/uL — ABNORMAL LOW (ref 0.7–4.0)
MCH: 25.4 pg — ABNORMAL LOW (ref 26.0–34.0)
MCHC: 29.5 g/dL — ABNORMAL LOW (ref 30.0–36.0)
MCV: 86.1 fL (ref 80.0–100.0)
Monocytes Absolute: 0.9 10*3/uL (ref 0.1–1.0)
Monocytes Relative: 7 %
Neutro Abs: 11.8 10*3/uL — ABNORMAL HIGH (ref 1.7–7.7)
Neutrophils Relative %: 89 %
Platelets: 473 10*3/uL — ABNORMAL HIGH (ref 150–400)
RBC: 5.63 MIL/uL (ref 4.22–5.81)
RDW: 23.9 % — ABNORMAL HIGH (ref 11.5–15.5)
WBC: 13.2 10*3/uL — ABNORMAL HIGH (ref 4.0–10.5)
nRBC: 0.2 % (ref 0.0–0.2)

## 2022-05-08 LAB — HEMOGLOBIN A1C
Hgb A1c MFr Bld: 5.1 % (ref 4.8–5.6)
Mean Plasma Glucose: 99.67 mg/dL

## 2022-05-08 LAB — GLUCOSE, CAPILLARY
Glucose-Capillary: 102 mg/dL — ABNORMAL HIGH (ref 70–99)
Glucose-Capillary: 102 mg/dL — ABNORMAL HIGH (ref 70–99)

## 2022-05-08 LAB — T4, FREE: Free T4: 0.86 ng/dL (ref 0.61–1.12)

## 2022-05-08 LAB — LACTIC ACID, PLASMA
Lactic Acid, Venous: 1.9 mmol/L (ref 0.5–1.9)
Lactic Acid, Venous: 1.3 mmol/L (ref 0.5–1.9)

## 2022-05-08 LAB — BASIC METABOLIC PANEL
Anion gap: 15 (ref 5–15)
BUN: 25 mg/dL — ABNORMAL HIGH (ref 8–23)
CO2: 16 mmol/L — ABNORMAL LOW (ref 22–32)
Calcium: 8.3 mg/dL — ABNORMAL LOW (ref 8.9–10.3)
Chloride: 104 mmol/L (ref 98–111)
Creatinine, Ser: 1.14 mg/dL (ref 0.61–1.24)
GFR, Estimated: >60 mL/min (ref >60)
Glucose, Bld: 115 mg/dL — ABNORMAL HIGH (ref 70–99)
Potassium: 5 mmol/L (ref 3.5–5.1)
Sodium: 135 mmol/L (ref 135–145)

## 2022-05-08 LAB — TROPONIN I (HIGH SENSITIVITY)
Troponin I (High Sensitivity): 32 ng/L — ABNORMAL HIGH (ref <18)
Troponin I (High Sensitivity): 30 ng/L — ABNORMAL HIGH (ref <18)

## 2022-05-08 LAB — MAGNESIUM: Magnesium: 2.6 mg/dL — ABNORMAL HIGH (ref 1.7–2.4)

## 2022-05-08 LAB — PROTIME-INR
INR: 1.7 — ABNORMAL HIGH (ref 0.8–1.2)
Prothrombin Time: 19.9 seconds — ABNORMAL HIGH (ref 11.4–15.2)

## 2022-05-08 LAB — TSH: TSH: 11.024 u[IU]/mL — ABNORMAL HIGH (ref 0.350–4.500)

## 2022-05-08 MED ORDER — AMIODARONE HCL IN DEXTROSE 360-4.14 MG/200ML-% IV SOLN
60.0000 mg/h | INTRAVENOUS | Status: DC
  Administered 2022-05-08 (×2): 60 mg/h via INTRAVENOUS
  Filled 2022-05-08 (×2): qty 200

## 2022-05-08 MED ORDER — MORPHINE SULFATE (PF) 2 MG/ML IV SOLN
1.0000 mg | INTRAVENOUS | Status: DC | PRN
  Administered 2022-05-08 – 2022-05-10 (×7): 1 mg via INTRAVENOUS
  Filled 2022-05-08 (×7): qty 1

## 2022-05-08 MED ORDER — SODIUM CHLORIDE 0.9 % IV SOLN
250.0000 mL | INTRAVENOUS | Status: DC | PRN
  Administered 2022-05-17 – 2022-05-19 (×2): 250 mL via INTRAVENOUS

## 2022-05-08 MED ORDER — IOHEXOL 300 MG/ML  SOLN
100.0000 mL | Freq: Once | INTRAMUSCULAR | Status: AC | PRN
  Administered 2022-05-08: 100 mL via INTRAVENOUS

## 2022-05-08 MED ORDER — PANTOPRAZOLE SODIUM 40 MG PO TBEC
40.0000 mg | DELAYED_RELEASE_TABLET | Freq: Every day | ORAL | Status: DC
Start: 2022-05-08 — End: 2022-05-11
  Administered 2022-05-08 – 2022-05-11 (×4): 40 mg via ORAL
  Filled 2022-05-08 (×4): qty 1

## 2022-05-08 MED ORDER — FENTANYL CITRATE PF 50 MCG/ML IJ SOSY
50.0000 ug | PREFILLED_SYRINGE | Freq: Once | INTRAMUSCULAR | Status: AC
  Administered 2022-05-08: 50 ug via INTRAVENOUS
  Filled 2022-05-08: qty 1

## 2022-05-08 MED ORDER — OXYCODONE HCL 5 MG PO TABS
5.0000 mg | ORAL_TABLET | Freq: Three times a day (TID) | ORAL | Status: DC | PRN
  Administered 2022-05-08 – 2022-05-10 (×5): 10 mg via ORAL
  Filled 2022-05-08 (×5): qty 2

## 2022-05-08 MED ORDER — POTASSIUM CHLORIDE CRYS ER 10 MEQ PO TBCR: 10.0000 meq | EXTENDED_RELEASE_TABLET | Freq: Every day | ORAL | Status: DC

## 2022-05-08 MED ORDER — AMIODARONE HCL IN DEXTROSE 360-4.14 MG/200ML-% IV SOLN
30.0000 mg/h | INTRAVENOUS | Status: DC
  Administered 2022-05-09 – 2022-05-17 (×17): 30 mg/h via INTRAVENOUS
  Filled 2022-05-08 (×17): qty 200

## 2022-05-08 MED ORDER — EZETIMIBE 10 MG PO TABS
10.0000 mg | ORAL_TABLET | Freq: Every day | ORAL | Status: DC
  Administered 2022-05-08 – 2022-05-22 (×15): 10 mg via ORAL
  Filled 2022-05-08 (×16): qty 1

## 2022-05-08 MED ORDER — DAPAGLIFLOZIN PROPANEDIOL 10 MG PO TABS
10.0000 mg | ORAL_TABLET | Freq: Every day | ORAL | Status: DC
  Administered 2022-05-08 – 2022-05-22 (×15): 10 mg via ORAL
  Filled 2022-05-08 (×16): qty 1

## 2022-05-08 MED ORDER — SODIUM CHLORIDE 0.9% FLUSH
3.0000 mL | INTRAVENOUS | Status: DC | PRN
  Administered 2022-05-11: 3 mL via INTRAVENOUS

## 2022-05-08 MED ORDER — ACETAMINOPHEN 325 MG PO TABS
650.0000 mg | ORAL_TABLET | Freq: Four times a day (QID) | ORAL | Status: DC | PRN
Start: 2022-05-08 — End: 2022-05-22
  Administered 2022-05-08 – 2022-05-21 (×9): 650 mg via ORAL
  Filled 2022-05-08 (×11): qty 2

## 2022-05-08 MED ORDER — PIPERACILLIN-TAZOBACTAM 3.375 G IVPB
3.3750 g | Freq: Three times a day (TID) | INTRAVENOUS | Status: DC
  Administered 2022-05-08 – 2022-05-13 (×14): 3.375 g via INTRAVENOUS
  Filled 2022-05-08 (×15): qty 50

## 2022-05-08 MED ORDER — LEVETIRACETAM 500 MG PO TABS
500.0000 mg | ORAL_TABLET | Freq: Two times a day (BID) | ORAL | Status: DC
Start: 2022-05-08 — End: 2022-05-22
  Administered 2022-05-08 – 2022-05-22 (×29): 500 mg via ORAL
  Filled 2022-05-08 (×29): qty 1

## 2022-05-08 MED ORDER — DAPAGLIFLOZIN PROPANEDIOL 10 MG PO TABS: 10.0000 mg | ORAL_TABLET | Freq: Every day | ORAL | 3 refills | Status: DC

## 2022-05-08 MED ORDER — FUROSEMIDE 20 MG PO TABS
40.0000 mg | ORAL_TABLET | ORAL | Status: DC
Start: 2022-05-09 — End: 2022-05-08

## 2022-05-08 MED ORDER — AMIODARONE LOAD VIA INFUSION
150.0000 mg | Freq: Once | INTRAVENOUS | Status: AC
  Administered 2022-05-08: 150 mg via INTRAVENOUS
  Filled 2022-05-08: qty 83.34

## 2022-05-08 MED ORDER — ATORVASTATIN CALCIUM 80 MG PO TABS
80.0000 mg | ORAL_TABLET | Freq: Every evening | ORAL | Status: DC
  Administered 2022-05-08 – 2022-05-21 (×14): 80 mg via ORAL
  Filled 2022-05-08 (×14): qty 1

## 2022-05-08 MED ORDER — ACETAMINOPHEN 650 MG RE SUPP: 650.0000 mg | Freq: Four times a day (QID) | RECTAL | Status: DC | PRN

## 2022-05-08 MED ORDER — SODIUM CHLORIDE 0.9% FLUSH
3.0000 mL | Freq: Two times a day (BID) | INTRAVENOUS | Status: DC
  Administered 2022-05-08 – 2022-05-21 (×15): 3 mL via INTRAVENOUS

## 2022-05-08 MED ORDER — INSULIN ASPART 100 UNIT/ML IJ SOLN
0.0000 [IU] | Freq: Three times a day (TID) | INTRAMUSCULAR | Status: DC
  Administered 2022-05-09 – 2022-05-10 (×2): 2 [IU] via SUBCUTANEOUS
  Administered 2022-05-10: 1 [IU] via SUBCUTANEOUS
  Administered 2022-05-11: 3 [IU] via SUBCUTANEOUS
  Administered 2022-05-13 – 2022-05-15 (×3): 1 [IU] via SUBCUTANEOUS
  Administered 2022-05-15: 2 [IU] via SUBCUTANEOUS
  Administered 2022-05-18 – 2022-05-20 (×2): 1 [IU] via SUBCUTANEOUS

## 2022-05-08 NOTE — Assessment & Plan Note (Addendum)
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  He has followed up with oncology where PET scan is planned and discussed treatment options

## 2022-05-08 NOTE — ED Notes (Signed)
Pt A&O x4. Denies any CP or SOB at this time. Pain the abd post-colon resection surgery on 04/18/2022. Abd soft-tender. Pt reports some dysuria x2 days.

## 2022-05-08 NOTE — Telephone Encounter (Signed)
Please re-prescribe with indication for heart failure, not for diabetes

## 2022-05-08 NOTE — Assessment & Plan Note (Signed)
No updated A1C in system, checking today Appears to be well controlled on diet alone Continue farxiga for CHF Start SSI and accuchecks per protocol. Sensitive while NPO

## 2022-05-08 NOTE — H&P (Signed)
History and Physical    Patient: Gregory [name] PPJ:[id number] DOB: 02/05/1961 DOA: 05/08/2022 DOS: the patient was seen and examined on 05/08/2022 PCP: Patient, [name]  Patient coming from: Home - lives with his son    Chief Complaint: ICD shock   HPI: Gregory [name] is a 61 y.o. male with medical history significant of   CAD status post stenting, chronic systolic HF, recurrent VT with ICD placement, atrial fibrillation, diabetes mellitus type 2, HTN, seizures,who presented to Ed after his ICD gave him a shock. He states around 7am he felt his heart rate was elevated and then he felt a shock and called 911. He denies any shortness of breath or chest pain. Has history of recurrent VT. He had recent surgery on his abdomen and states pain is not much improved. Denies any discharge from his incision. He has had normal BM until yesterday. He has not had a BM since Tuesday. He has had +flatus today. He states pain in his abdomen is worse today, rated as a 9/10. Pain on RUQ and on lower abdomen. No radiation. Sitting up makes it worse.  Denies any associated N/V.   Recent hospitalization 4/28-5/12/23 for symptomatic anemia with hgb of 4g. +FOBT. S/p EGD and colonoscopy 5/3 showed partially obstructing colonic mass suspicious for colon cancer->colon adenocarcinoma, moderate to poorly differentiated. Metastatic involving 11 of 35 lymph nodes. Also had cardiac cath 5/4 and s/p hand-assisted laparoscopic right colectomy 5/5. Received 7 units total of PRBC and feraheme x1 dose on 5/5.    He has been feeling good. Denies any fever/chills, vision changes/headaches, chest pain, shortness of breath or cough, abdominal pain, N/V/D, dysuria or leg swelling.   Last dose of eliquis was last night   He does not smoke or drink alcohol   ER Course:  vitals: afebrile, bp: 108/80, HR: 81, RR: 20, oxygen: 96% RA Pertinent labs: wbc: 13.2, CO2: 16, TSH: 11, free T4: .86, troponin  32>30,  Abdominal xray: ileus. Bibasilar airspace opacities.  Atelectasis vs. Infection  In ED: cardiology (EP) and general surgery consulted. CT abdomen has been ordered. Started on amiodarone drip     Review of Systems: As mentioned in the history of present illness. All other systems reviewed and are negative. Past Medical History:  Diagnosis Date   AICD (automatic cardioverter/defibrillator) present 2005   CAD (coronary artery disease) 12/01/2013   Chronic combined systolic and diastolic CHF, NYHA class 1 ([place]) 12/01/2013   Erectile dysfunction 12/01/2013   HTN (hypertension) 12/01/2013   Hyperlipidemia 12/01/2013   Ischemic cardiomyopathy 12/01/2013   Presence of permanent cardiac pacemaker    Sleep apnea    Past Surgical History:  Procedure Laterality Date   ACHILLES TENDON REPAIR     lft foot   BIOPSY  04/16/2022   Procedure: BIOPSY;  Surgeon: Yetta Flock, MD;  Location: [place];  Service: Gastroenterology;;   CARDIAC CATHETERIZATION N/A 05/05/2016   Procedure: Left Heart Cath and Coronary Angiography;  Surgeon: Leonie Man, MD;  Location: [place] CV LAB;  Service: Cardiovascular;  Laterality: N/A;   CARDIAC DEFIBRILLATOR PLACEMENT     CARDIOVERSION N/A 09/13/2020   Procedure: CARDIOVERSION;  Surgeon: Werner Lean, MD;  Location: [place] ENDOSCOPY;  Service: Cardiovascular;  Laterality: N/A;   CARDIOVERSION N/A 09/19/2020   Procedure: CARDIOVERSION;  Surgeon: [name], MD;  Location: [facility] ENDOSCOPY;  Service: Cardiovascular;  Laterality: N/A;   COLON RESECTION N/A 04/18/2022   Procedure: HAND ASSISTED LAPAROSCOPIC RIGHT  COLON RESECTION;  Surgeon: Felicie Morn, MD;  Location: [place];  Service: General;  Laterality: N/A;   COLONOSCOPY WITH PROPOFOL N/A 04/16/2022   Procedure: COLONOSCOPY WITH PROPOFOL;  Surgeon: Yetta Flock, MD;  Location: [place];  Service: Gastroenterology;  Laterality: N/A;   EP IMPLANTABLE DEVICE N/A 12/19/2016    Procedure: ICD Generator Changeout;  Surgeon: [name], MD;  Location: [place] CV [facility];  Service: Cardiovascular;  Laterality: N/A;   ESOPHAGOGASTRODUODENOSCOPY N/A 04/16/2022   Procedure: ESOPHAGOGASTRODUODENOSCOPY (EGD);  Surgeon: Yetta Flock, MD;  Location: [facility] ENDOSCOPY;  Service: Gastroenterology;  Laterality: N/A;   LEFT HEART CATH AND CORONARY ANGIOGRAPHY N/A 04/17/2022   Procedure: LEFT HEART CATH AND CORONARY ANGIOGRAPHY;  Surgeon: Belva Crome, MD;  Location: [place] CV LAB;  Service: Cardiovascular;  Laterality: N/A;   POLYPECTOMY  04/16/2022   Procedure: POLYPECTOMY;  Surgeon: Yetta Flock, MD;  Location: [facility] ENDOSCOPY;  Service: Gastroenterology;;   RIGHT/LEFT HEART CATH AND CORONARY ANGIOGRAPHY N/A 09/24/2020   Procedure: RIGHT/LEFT HEART CATH AND CORONARY ANGIOGRAPHY;  Surgeon: Lorretta Harp, MD;  Location: [place] CV LAB;  Service: Cardiovascular;  Laterality: N/A;   SUBMUCOSAL TATTOO INJECTION  04/16/2022   Procedure: SUBMUCOSAL TATTOO INJECTION;  Surgeon: Yetta Flock, MD;  Location: [facility] ENDOSCOPY;  Service: Gastroenterology;;   WRIST TENODESIS     Social History:  reports that he quit smoking about 8 years ago. His smoking use included cigarettes. He has a 30.00 pack-year smoking history. He has never used smokeless tobacco. He reports that he does not currently use alcohol. He reports that he does not use drugs.  No Known Allergies  Family History  Problem Relation Age of Onset   Heart disease Mother    Brain cancer Father    Hypertension Daughter     Prior to Admission medications   Medication Sig Start Date End Date Taking? Authorizing Provider  amiodarone (PACERONE) 200 MG tablet Take 2 tablets (400 mg total) by mouth daily. 04/24/22   Patrecia Pour, MD  apixaban (ELIQUIS) 5 MG TABS tablet TAKE 1 TABLET(5 MG) BY MOUTH TWICE DAILY Patient taking differently: Take 5 mg by mouth 2 (two) times daily. 04/11/22   Croitoru, [name], MD   atorvastatin (LIPITOR) 80 MG tablet TAKE ONE TABLET BY MOUTH EVERY EVENING 05/05/22   Croitoru, [name], MD  clopidogrel (PLAVIX) 75 MG tablet TAKE 1 TABLET(75 MG) BY MOUTH DAILY Patient taking differently: Take 75 mg by mouth daily. TAKE 1 TABLET(75 MG) BY MOUTH DAILY 10/02/21   Croitoru, Dani Gobble, MD  dapagliflozin propanediol (FARXIGA) 10 MG TABS tablet Take 1 tablet (10 mg total) by mouth daily. 05/08/22   Croitoru, [name], MD  ezetimibe (ZETIA) 10 MG tablet Take 1 tablet (10 mg total) by mouth daily. 03/13/22   Croitoru, [name], MD  furosemide (LASIX) 20 MG tablet Take 2 tablets (40 mg total) by mouth 3 (three) times a week. Patient taking differently: Take 40 mg by mouth every Monday, Wednesday, and Friday. 03/12/22   [name], MD  levETIRAcetam (KEPPRA) 500 MG tablet TAKE 1 TABLET(500 MG) BY MOUTH TWICE DAILY 04/28/22   [name], MD  metoprolol succinate (TOPROL-XL) 25 MG 24 hr tablet Take one tablet, 25 mg, in the morning and 50 mg, two tablets, in the evening. Patient taking differently: 25 mg. Per patient taking 25 mg in the morning and 25 mg at night 06/27/21   Croitoru, [name], MD  oxyCODONE (OXY IR/ROXICODONE) 5 MG immediate  release tablet Take 1-2 tablets (5-10 mg total) by mouth every 8 (eight) hours as needed for breakthrough pain. 05/05/22   [name], MD  pantoprazole (PROTONIX) 40 MG tablet TAKE 1 TABLET(40 MG) BY MOUTH DAILY Patient taking differently: Take 40 mg by mouth daily. TAKE 1 TABLET(40 MG) BY MOUTH DAILY 03/13/22   Croitoru, [name], MD  potassium chloride (KLOR-CON M) 10 MEQ tablet Take 1 tablet (10 mEq total) by mouth daily. 12/02/21   Croitoru, [name], MD  sacubitril-valsartan (ENTRESTO) 49-51 MG Take 1 tablet by mouth 2 (two) times daily. 10/07/21   Croitoru, [name], MD  sildenafil (VIAGRA) 50 MG tablet Take 1 tablet (50 mg total) by mouth as needed for erectile dysfunction. 01/25/21   Shirley Friar, PA-C    Physical Exam: Vitals:   05/08/22 1345 05/08/22  1400 05/08/22 1430 05/08/22 1626  BP: 107/64 105/81 92/75 118/78  Pulse: 91 91 92   Resp: '16 17 14 '$ (P) 18  Temp:      TempSrc:    (P) Oral  SpO2: 93% 91% 91% (P) 92%  Weight:    (P) 102.1 kg  Height:    (P) '6\' 6"'$  (1.981 m)   General:  Appears calm and comfortable and is in NAD Eyes:  PERRL, EOMI, normal lids, iris. Has bilateral xanthelasmatas.  ENT:  grossly normal hearing, lips & tongue, mmm; appropriate dentition Neck:  no LAD, masses or thyromegaly; no carotid bruits, no JVD Cardiovascular:  RRR, no m/r/g. Left foot edema 2+ pitting.  Respiratory:   CTA bilaterally with no wheezes/rales/rhonchi.  Normal respiratory effort. Abdomen:  soft, significant TTP diffusely. No rebound/guarding. BS+. Incision dry.  Back:   normal alignment, no CVAT Skin:  no rash or induration seen on limited exam Musculoskeletal:  grossly normal tone BUE/BLE, good ROM, no bony abnormality Lower extremity:    Limited foot exam with no ulcerations.  2+ distal pulses. Psychiatric:  grossly normal mood and affect, speech fluent and appropriate, AOx3 Neurologic:  CN 2-12 grossly intact, moves all extremities in coordinated fashion, sensation intact   Radiological Exams on Admission: Independently reviewed - see discussion in A/P where applicable  DG Abdomen Acute W/Chest  Result Date: 05/08/2022 CLINICAL DATA:  Abdominal pain EXAM: DG ABDOMEN ACUTE WITH 1 VIEW CHEST COMPARISON:  Radiograph 04/21/2022 FINDINGS: Diffuse gaseous distension of small and large bowel with some dilated small bowel loops noted. There is no evidence of free intraperitoneal gas. No abnormal calcifications. Cardiomegaly with unchanged pacemaker/AICD leads. There are bibasilar opacities, left greater than right. No large pleural effusion. No visible pneumothorax. There is no acute osseous abnormality. Degenerative changes of the spine. 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 IMPRESSION: Diffuse gaseous  distension of small and large bowel with some dilated small bowel loops. Findings are compatible with ileus. Bibasilar airspace opacities, could be atelectasis or infection. Electronically Signed   By: Maurine Simmering M.D.   On: 05/08/2022 13:15    EKG: Independently reviewed.  Sinus tachycardai with rate 102; nonspecific ST changes with no evidence of acute ischemia   Labs on Admission: I have personally reviewed the available labs and imaging studies at the time of the admission.  Pertinent labs:   wbc: 13.2,  CO2: 16,  TSH: 11,  free T4: .86,  troponin 32>30,   Assessment and Plan: Principal Problem:   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  Active Problems:   Ileus following gastrointestinal surgery (HCC)   Hypothyroidism   Leukocytosis  Cancer of right colon (HCC)   Chronic combined systolic and diastolic CHF (congestive heart failure) (HCC)/ischemic cardiomyopathy   CAD S/P percutaneous coronary angioplasty   Paroxysmal atrial fibrillation (HCC)   Controlled type 2 diabetes mellitus without complication, without long-term current use of insulin (HCC)   Essential hypertension   Seizures (HCC)   OSA on CPAP    Assessment and Plan: * 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 Dr. Lovena Le still to see, unsure if ablation will be discussed while in patient with recurrent history  -TSH abnormal -magnesium 2.6  -f/u on EP recommendations   Ileus following gastrointestinal surgery [facility]) He is s/p hand-assisted laparoscopic right colectomy on 04/18/22. Had post operative ileus during last hospital stay Now presenting with abdominal pain, last BM yesterday and xray finding of ileus General surgery has been consulted CT abdomen is  pending-have asked to see why this hasn't been done  He does not have a surgical abdomen on exam, but is tender Lactic acid pending Holding eliquis/plavix until CT results  NPO except for ice chips/meds  Pain control with home oxycodone and will add IV morphine for breakthrough pain   Leukocytosis Query if more reactive, no other signs/symptoms of sepsis CT abdomen is pending as well as lactic acid No reported or recorded fevers Trend fever curve and monitor   Hypothyroidism ? If due to amiodorone vs. 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  He has followed up with oncology where PET scan is planned and discussed treatment options  Chronic combined systolic and diastolic CHF (congestive heart failure) (HCC)/ischemic cardiomyopathy Echo 04/07/2022: LVEF 35-40%, worse compared to 09/22/2020 (LVEF 40-45%). Clinically euvolemic.  - Continue farxiga -holding metoprolol XL and Entresto with soft bp/hypotension   Paroxysmal atrial fibrillation (HCC) In NSR continue to keep on telemetry  On amiodorone drip Last dose of eliquis was yesterday Holding until CT abdomen results in case requires surgery  Hold beta blocker with soft bp readings    CAD S/P percutaneous coronary angioplasty S/p LHC on 04/17/22 recommendations:  Anatomy is similar to prior with the following exceptions: 50% to 70% stenosis proximal to the large first obtuse marginal.  40 to 50% stenosis proximal to the stent segment in the first diagonal. He has no chest pain, troponin flat and mildly elevated Continue  medical management. Holding plavix until CT abdomen results    Controlled type 2 diabetes mellitus  without complication, without long-term current use of insulin (HCC) No updated A1C in system, checking today Appears to be well controlled on diet alone Continue farxiga for CHF Start SSI and accuchecks per protocol. Sensitive while NPO    Essential hypertension Soft bp, systolic <676.  Has not had any medication in 1-2 days Hold entrestro, toprol-xl and lasix   Seizures (HCC) Continue keppra Seizure precautions   OSA on CPAP Uses cpap at night  Continue qhs     Advance Care Planning:   Code Status: Full Code   Consults: cardiology EP,  general surgery   DVT Prophylaxis: SCD/TED hose   Family Communication: none   Severity of Illness: The appropriate patient status for this patient is INPATIENT. Inpatient status is judged to be reasonable and necessary in order to provide the required intensity of service to ensure the patient's safety. The patient's presenting symptoms, physical exam findings, and initial radiographic and laboratory data in the context of their chronic comorbidities is felt to place them at high risk for further clinical deterioration. Furthermore, it is not anticipated that the patient will be medically stable for discharge from the hospital within 2 midnights of admission.   * I certify that at the point of admission it is my clinical judgment that the patient will require inpatient hospital care spanning beyond 2 midnights from the point of admission due to high intensity of service, high risk for further deterioration and high frequency of surveillance required.*  Author: Orma Flaming, MD 05/08/2022 5:11 PM  For on call review [email or web address].

## 2022-05-08 NOTE — Telephone Encounter (Signed)
Farxiga sent in for Heart Failure. It was noted that this was preferred over Jardiance from insurance

## 2022-05-08 NOTE — Assessment & Plan Note (Signed)
Uses cpap at night  Continue qhs

## 2022-05-08 NOTE — Assessment & Plan Note (Addendum)
He is s/p hand-assisted laparoscopic right colectomy on 04/18/22. Had post operative ileus during last hospital stay Now presenting with abdominal pain, last BM yesterday and xray finding of ileus General surgery has been consulted CT abdomen is pending-have asked to see why this hasn't been done  He does not have a surgical abdomen on exam, but is tender Lactic acid pending Holding eliquis/plavix until CT results  NPO except for ice chips/meds  Pain control with home oxycodone and will add IV morphine for breakthrough pain

## 2022-05-08 NOTE — Telephone Encounter (Signed)
Incoming call from patient stating he was currently in the ED after receiving a shock from his ICD accompanied by shortness of breath. Unfortunately unable to see transmission remotely secondary to patient awaiting new monitor. Advised patient that primary EP would be alerted.

## 2022-05-08 NOTE — Progress Notes (Signed)
Patient ID: Gregory [name], male   DOB: 1961-04-04, 61 y.o.   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  I went by and examined the patient and while his abdomen is tender he is not ill-appearing and has normal vital signs.  White blood cell count 13,200.  I discussed the CT with the interventional radiologist on-call, Dr. Laurence Ferrari, and he also noted there is significant abnormality of the splenic artery.  He is unsure if an embolization of this issue would be possible.  He recommends doing a CT angio of the abdomen in the morning to further delineate the arterial anatomy.  He also recommends ordering image guided drainage of the abscess as well as abdominal angiogram and he will discuss things with the interventional radiologist working tomorrow.  I will start IV Zosyn as well.  Georganna Skeans, MD, MPH, FACS Please use [email or web address] to contact on call provider

## 2022-05-08 NOTE — Assessment & Plan Note (Addendum)
61 year old male with history of recurrent VT s/p AICD who presented to ED after he had a shock from his ICD at home this morning -admit to telemetry, in NSR now, no chest pain -EP has been consulted, started on amiodarone drip. Dr. Lovena Le still to see, unsure if ablation will be discussed while in patient with recurrent history  -TSH abnormal -magnesium 2.6  -f/u on EP recommendations

## 2022-05-08 NOTE — ED Provider Notes (Signed)
Emergency Department Provider Note   I have reviewed the triage vital signs and the nursing notes.   HISTORY  Chief Complaint Chest Pain   HPI Gregory [name] is a 61 y.o. male with complex past history including ischemic cardiomyopathy and pacemaker presents the emergency department today after his AICD fired this morning.  He states he was feeling somewhat short of breath and weak and felt like his device may fire.  He estimates its fired approximately 3 times in the past.  He has not had any chest discomfort before the device fired but since that time has had some discomfort in the chest.  No syncope.  He did not take his morning medications.  He was supposed to remotely send his device check in this morning but states his machine at home broke.  He recently got out of the hospital with GI bleeding and found to have colon cancer and surgery.  He is also missing a postoperative appointment today but is not having any issues in terms of abdominal pain or wound drainage.    Past Medical History:  Diagnosis Date   AICD (automatic cardioverter/defibrillator) present 2005   CAD (coronary artery disease) 12/01/2013   Chronic combined systolic and diastolic CHF, NYHA class 1 ([place]) 12/01/2013   Erectile dysfunction 12/01/2013   HTN (hypertension) 12/01/2013   Hyperlipidemia 12/01/2013   Ischemic cardiomyopathy 12/01/2013   Presence of permanent cardiac pacemaker    Sleep apnea     Review of Systems  Constitutional: No fever/chills Eyes: No visual changes. ENT: No sore throat. Cardiovascular: Positive chest pain and AICD firing.  Respiratory: Denies shortness of breath. Gastrointestinal: No abdominal pain.  No nausea, no vomiting.  No diarrhea.  No constipation. Genitourinary: Negative for dysuria. Musculoskeletal: Negative for back pain. Skin: Negative for rash. Neurological: Negative for headaches, focal weakness or  numbness.   ____________________________________________   PHYSICAL EXAM:  VITAL SIGNS: ED Triage Vitals [05/08/22 0841]  Enc Vitals Group     BP 108/80     Pulse Rate 81     Resp 20     Temp 97.9 F (36.6 C)     Temp Source Oral     SpO2 96 %     Weight 211 lb (95.7 kg)     Height '6\' 6"'$  (1.981 m)   Constitutional: Alert and oriented. Well appearing and in no acute distress. Eyes: Conjunctivae are normal. Head: Atraumatic. Nose: No congestion/rhinnorhea. Mouth/Throat: Mucous membranes are moist. Neck: No stridor.  Cardiovascular: Normal rate, regular rhythm. Good peripheral circulation. Grossly normal heart sounds.   Respiratory: Normal respiratory effort.  No retractions. Lungs CTAB. Gastrointestinal: Soft with mild tenderness in the post-op setting. No peritonitis. No distention.  Musculoskeletal: No lower extremity tenderness nor edema. No gross deformities of extremities. Neurologic:  Normal speech and language.  Skin:  Skin is warm, dry and intact. No rash noted.   ____________________________________________   LABS (all labs ordered are listed, but only abnormal results are displayed)  Labs Reviewed  BASIC METABOLIC PANEL - Abnormal; Notable for the following components:      Result Value   CO2 16 (*)    Glucose, Bld 115 (*)    BUN 25 (*)    Calcium 8.3 (*)    All other components within normal limits  CBC WITH DIFFERENTIAL/PLATELET - Abnormal; Notable for the following components:   WBC 13.2 (*)    MCH 25.4 (*)    MCHC 29.5 (*)    RDW 23.9 (*)  Platelets 473 (*)    Neutro Abs 11.8 (*)    Lymphs Abs 0.4 (*)    All other components within normal limits  MAGNESIUM - Abnormal; Notable for the following components:   Magnesium 2.6 (*)    All other components within normal limits  TSH - Abnormal; Notable for the following components:   TSH 11.024 (*)    All other components within normal limits  T3 - Abnormal; Notable for the following components:    T3, Total 62 (*)    All other components within normal limits  PROTIME-INR - Abnormal; Notable for the following components:   Prothrombin Time 19.9 (*)    INR 1.7 (*)    All other components within normal limits  CBC - Abnormal; Notable for the following components:   WBC 17.9 (*)    MCH 24.2 (*)    MCHC 29.3 (*)    RDW 24.2 (*)    Platelets 438 (*)    All other components within normal limits  GLUCOSE, CAPILLARY - Abnormal; Notable for the following components:   Glucose-Capillary 102 (*)    All other components within normal limits  GLUCOSE, CAPILLARY - Abnormal; Notable for the following components:   Glucose-Capillary 102 (*)    All other components within normal limits  BASIC METABOLIC PANEL - Abnormal; Notable for the following components:   Sodium 130 (*)    BUN 25 (*)    Calcium 7.9 (*)    All other components within normal limits  PROTIME-INR - Abnormal; Notable for the following components:   Prothrombin Time 18.6 (*)    INR 1.6 (*)    All other components within normal limits  COMPREHENSIVE METABOLIC PANEL - Abnormal; Notable for the following components:   Sodium 128 (*)    Potassium 5.2 (*)    CO2 19 (*)    Glucose, Bld 119 (*)    Calcium 7.8 (*)    Total Protein 5.7 (*)    Albumin 1.6 (*)    AST 56 (*)    All other components within normal limits  CBC - Abnormal; Notable for the following components:   WBC 28.8 (*)    Hemoglobin 12.1 (*)    MCH 25.1 (*)    RDW 24.0 (*)    Platelets 407 (*)    All other components within normal limits  GLUCOSE, CAPILLARY - Abnormal; Notable for the following components:   Glucose-Capillary 113 (*)    All other components within normal limits  GLUCOSE, CAPILLARY - Abnormal; Notable for the following components:   Glucose-Capillary 116 (*)    All other components within normal limits  GLUCOSE, CAPILLARY - Abnormal; Notable for the following components:   Glucose-Capillary 177 (*)    All other components within normal  limits  GLUCOSE, CAPILLARY - Abnormal; Notable for the following components:   Glucose-Capillary 147 (*)    All other components within normal limits  COMPREHENSIVE METABOLIC PANEL - Abnormal; Notable for the following components:   Sodium 129 (*)    Glucose, Bld 127 (*)    Calcium 7.7 (*)    Total Protein 5.2 (*)    Albumin <1.5 (*)    AST 70 (*)    Alkaline Phosphatase 156 (*)    All other components within normal limits  CBC - Abnormal; Notable for the following components:   WBC 28.6 (*)    RBC 4.19 (*)    Hemoglobin 10.7 (*)    HCT 34.5 (*)  MCH 25.5 (*)    RDW 22.5 (*)    All other components within normal limits  GLUCOSE, CAPILLARY - Abnormal; Notable for the following components:   Glucose-Capillary 106 (*)    All other components within normal limits  GLUCOSE, CAPILLARY - Abnormal; Notable for the following components:   Glucose-Capillary 115 (*)    All other components within normal limits  GLUCOSE, CAPILLARY - Abnormal; Notable for the following components:   Glucose-Capillary 123 (*)    All other components within normal limits  TROPONIN I (HIGH SENSITIVITY) - Abnormal; Notable for the following components:   Troponin I (High Sensitivity) 32 (*)    All other components within normal limits  TROPONIN I (HIGH SENSITIVITY) - Abnormal; Notable for the following components:   Troponin I (High Sensitivity) 30 (*)    All other components within normal limits  AEROBIC/ANAEROBIC CULTURE W GRAM STAIN (SURGICAL/DEEP WOUND)  T4, FREE  PATHOLOGIST SMEAR REVIEW  LACTIC ACID, PLASMA  LACTIC ACID, PLASMA  HEMOGLOBIN A1C  MAGNESIUM  GLUCOSE, CAPILLARY  GLUCOSE, CAPILLARY  MAGNESIUM  PHOSPHORUS  GLUCOSE, CAPILLARY  POTASSIUM  PHOSPHORUS  MAGNESIUM   ____________________________________________  EKG   EKG Interpretation  Date/Time:  Thursday May 08 2022 08:39:10 EDT Ventricular Rate:  102 PR Interval:  185 QRS Duration: 127 QT Interval:  355 QTC  Calculation: 463 R Axis:   -20 Text Interpretation: Sinus tachycardia Probable left atrial enlargement Left ventricular hypertrophy Inferior infarct, old Anterior Q waves, possibly due to LVH Lateral leads are also involved Rate higher but overall similar to Apr 22, 2022 tracing Confirmed by Nanda Quinton [phone]) on 05/08/2022 8:42:31 AM        ____________________________________________   PROCEDURES  Procedure(s) performed:   Procedures  CRITICAL CARE Performed by: Margette Fast Total critical care time: 35 minutes Critical care time was exclusive of separately billable procedures and treating other patients. Critical care was necessary to treat or prevent imminent or life-threatening deterioration. Critical care was time spent personally by me on the following activities: development of treatment plan with patient and/or surrogate as well as nursing, discussions with consultants, evaluation of patient's response to treatment, examination of patient, obtaining history from patient or surrogate, ordering and performing treatments and interventions, ordering and review of laboratory studies, ordering and review of radiographic studies, pulse oximetry and re-evaluation of patient's condition.  Nanda Quinton, MD Emergency Medicine  ____________________________________________   INITIAL IMPRESSION / ASSESSMENT AND PLAN / ED COURSE  Pertinent labs & imaging results that were available during my care of the patient were reviewed by me and considered in my medical decision making (see chart for details).   This patient is Presenting for Evaluation of CP, which does require a range of treatment options, and is a complaint that involves a high risk of morbidity and mortality.  The Differential Diagnoses includes all life-threatening causes for chest pain. This includes but is not exclusive to acute coronary syndrome, aortic dissection, pulmonary embolism, cardiac tamponade, community-acquired  pneumonia, pericarditis, musculoskeletal chest wall pain, etc.    Clinical Laboratory Tests Ordered, included CBC with mild leukocytosis. No significant electrolyte abnormalities.   Social Determinants of Health Risk patient is a former smoker.   Consult complete with   Cardiology - Plan for Amiodarone infusion and will follow along with medicine admit. Will monitor and consider VT ablation procedure.   Surgery - They will consult and decide on CT  Hospitalist - Plan for admit.   Medical Decision Making: Summary:  Patient presents emergency department with AICD firing this morning.  Patient in sinus rhythm here with normal vital signs overall.  No breakthrough VT. no significant electrolyte derangement.  Consulted with cardiology with plan for amiodarone infusion and admit.  Patient has some abdominal distention and diffuse tenderness.  Acute abdomen plain film shows no obvious bowel obstruction pattern and more of an ileus type presentation.  Will discuss with general surgery.   Reevaluation with update and discussion with patient. Agrees with plan for admit. Feeling well.   Disposition: admit  ____________________________________________  FINAL CLINICAL IMPRESSION(S) / ED DIAGNOSES  Final diagnoses:  Ventricular tachycardia ([place])  Ileus ([place])     NEW OUTPATIENT MEDICATIONS STARTED DURING THIS VISIT:  Current Discharge Medication List     START taking these medications   Details  dapagliflozin propanediol (FARXIGA) 10 MG TABS tablet Take 1 tablet (10 mg total) by mouth daily. Qty: 90 tablet, Refills: 3        Note:  This document was prepared using Dragon voice recognition software and may include unintentional dictation errors.  Nanda Quinton, MD, [facility] Emergency Medicine    Ronnita Paz, [facility], MD 05/11/22 1250

## 2022-05-08 NOTE — Assessment & Plan Note (Signed)
Query if more reactive, no other signs/symptoms of sepsis CT abdomen is pending as well as lactic acid No reported or recorded fevers Trend fever curve and monitor

## 2022-05-08 NOTE — Assessment & Plan Note (Signed)
In NSR continue to keep on telemetry  On amiodorone drip Last dose of eliquis was yesterday Holding until CT abdomen results in case requires surgery  Hold beta blocker with soft bp readings

## 2022-05-08 NOTE — Assessment & Plan Note (Addendum)
S/p LHC on 04/17/22 recommendations:  . Anatomy is similar to prior with the following exceptions: 50% to 70% stenosis proximal to the large first obtuse marginal.  40 to 50% stenosis proximal to the stent segment in the first diagonal. He has no chest pain, troponin flat and mildly elevated Continue  medical management. Holding plavix until CT abdomen results

## 2022-05-08 NOTE — Assessment & Plan Note (Signed)
Soft bp, systolic <742.  Has not had any medication in 1-2 days Hold entrestro, toprol-xl and lasix

## 2022-05-08 NOTE — Progress Notes (Signed)
Placed patient on CPAP for the night via auto-mode with oxygen set at 2lpm  

## 2022-05-08 NOTE — Telephone Encounter (Signed)
Thank you, Portia.   There is a [facility] express from the ED available now.  Since May 5th he has had 169 episodes of VT, 164 of which were treated, almost all of them in the last 2-3 days or so. Most of the EGM's show 1: 1 AV association but I think it is VT with retrograde atrial activation.  There is also one episode that looks like atrial flutter with 2: 1 AV conduction at the very bottom of the available EGM's.  The intracardiac ventricular EGM looks different during tachycardia compared with the native rhythm EGM.  He currently appears to be in sinus rhythm. I spoke with Renee. EP will see.

## 2022-05-08 NOTE — Assessment & Plan Note (Signed)
?   If due to amiodorone vs. Sick euthyroid Free T4 wnl Would repeat levels in 1-2 weeks

## 2022-05-08 NOTE — Consult Note (Addendum)
Consult note   Patient ID: Azarias Chiou MRN: [id number]; DOB: 10-05-1961   Admission date: 05/08/2022  PCP:  Patient, [name]   [facility] Providers Cardiologist:  Sanda Klein, MD  Electrophysiologist:  Virl Axe, MD  {   Chief Complaint:  ICD shock  Patient Profile:   Sakari Alkhatib is a 60 y.o. male with  history of CAD (known chronic occl of RCA with prior PCIs to LAD, diag, and LCx), VT, ICM,  OSA w/CPAP, seizures, chronic CHF, HLD, HTN, obesity, AFib, h/o VT, PVCs. who is being seen 05/08/2022 for the evaluation of ICD shocks, VT.   Device information Medtronic Dual Chamber ICD implanted 2005, 2010 and gen change for 2018    2017 to have slow VT prompting reduction of his VT zone rate to 150bpm.   Oct 2021, VT with appropriate therapies   He had stopped drinking (heavily at least).  He noted previously had been on amiodarone and mexiletine , the mexiletine stopped 2/2 GI symptoms, and the amiodarone stopped 2/2 abn ALk phos   Oct 2021 > amio restarted for VT    History of Present Illness:   Mr. Jablon with a long hx of VT, known to have a number of different VT's, recently referred to Dr. Lovena Le via Dr. Caryl Comes for consideration of VT ablation. He saw Dr. Lovena Le 03/21/22. He noted  Via his ICD 2  VT morphologies, VT at 188 bpm as well as a VT of almost 300 bpm Only ONE EKG of a VT was available  from 2017 demonstrates a right bundle branch block VT with a northwest axis.  The transition from positive to negative was at lead V2.  The above findings suggest a lateral infarction near the apex With at least 3 known morphologies and one very fast, not likely that an ablation would be successful, but perhaps able to improve his VT. Discussed with the patient and his daughter planned to continue high dose amio  Looks like planned for cath  04/11/22 labs done for pre-cath noted marked anemia with a HGB of 4 and referred to the  hospital He was hospitalized 04/11/22 - 04/25/22 He was found with a colon mass, had multiple episodes of NSVT and cardiology consulted, underwent cath on 04/17/22 with relatively stable CAD, Dr. Ellyn Hack noted: Overall, relatively stable findings similar to previous, but there are some moderate upstream lesions in the OM1 and D1, just upstream of the stents.  Otherwise he has known occluded distal circumflex and RCA. Planned to proceed with GI surgery.  Underwent hand-assisted laparoscopic right colectomy 5/5. 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  Discharged on  Toprol $Remov'25mg'jHVufO$  AM $R'50mg'mH$  PM Amiodarone $RemoveBefor'400mg'IPdRyyytgnkD$  daily As well as back on his eliquis and plavix  He called the office a couple days ago with HRs 110s, started over the weekend, his home transmitter not working, outside of noting elevated HRs had no symptoms RN f/u noted HR reported as better, pending home monitor to send transmission  He comes to [facility] ER today with palpitations and ICD therapies  LABS K+ 5.0 Mag 2.6 BUN/Creat 25/1.14 WBC 13.32 H/H 14/48 Plts 473 TSH 11.024 HS Trop 32  Device interrogation Battery and lead measurements are good 169 total treated episodes ONE shock All others ATP Started 05/06/22  EGMS available to review look a 1:1 though morphology is clearly different then his known sinus morphology and either a dual tachycardia or retrograde conduction.  He reports no CP Had been doing pretty well since home, about how he expected to be feeling. Over the weekend he started noting his HR elevated, but no particular symptoms and no syncope. Unable to send a transmission This AM got a shock and came in. He has felt more SOB the last day or so, his belly more bloated He reports a BM last  on Tuesday and was "normal" He says his belly still hurts some, but expected that, remains tender all over today, unclear if it has been all along   Past Medical History:  Diagnosis Date   AICD (automatic cardioverter/defibrillator) present 2005   CAD (coronary artery disease) 12/01/2013   Chronic combined systolic and diastolic CHF, NYHA class 1 ([place]) 12/01/2013   Erectile dysfunction 12/01/2013   HTN (hypertension) 12/01/2013   Hyperlipidemia 12/01/2013   Ischemic cardiomyopathy 12/01/2013   Presence of permanent cardiac pacemaker    Sleep apnea     Past Surgical History:  Procedure Laterality Date   ACHILLES TENDON REPAIR     lft foot   BIOPSY  04/16/2022   Procedure: BIOPSY;  Surgeon: Yetta Flock, MD;  Location: [place];  Service: Gastroenterology;;   CARDIAC CATHETERIZATION N/A 05/05/2016   Procedure: Left Heart Cath and Coronary Angiography;  Surgeon: Leonie Man, MD;  Location: [place] [facility];  Service: Cardiovascular;  Laterality: N/A;   CARDIAC DEFIBRILLATOR PLACEMENT     CARDIOVERSION N/A 09/13/2020   Procedure: CARDIOVERSION;  Surgeon: Werner Lean, MD;  Location: [place] ENDOSCOPY;  Service: Cardiovascular;  Laterality: N/A;   CARDIOVERSION N/A 09/19/2020   Procedure: CARDIOVERSION;  Surgeon: [name], MD;  Location: [facility] ENDOSCOPY;  Service: Cardiovascular;  Laterality: N/A;   COLON RESECTION N/A 04/18/2022   Procedure: HAND ASSISTED LAPAROSCOPIC RIGHT COLON RESECTION;  Surgeon: Felicie Morn, MD;  Location: [place];  Service: General;  Laterality: N/A;   COLONOSCOPY WITH PROPOFOL N/A 04/16/2022   Procedure: COLONOSCOPY WITH PROPOFOL;  Surgeon: Yetta Flock, MD;  Location: [place];  Service: Gastroenterology;  Laterality: N/A;   EP IMPLANTABLE DEVICE N/A 12/19/2016   Procedure: ICD Generator Changeout;  Surgeon: [name], MD;  Location: [place] [facility];  Service: Cardiovascular;  Laterality: N/A;    ESOPHAGOGASTRODUODENOSCOPY N/A 04/16/2022   Procedure: ESOPHAGOGASTRODUODENOSCOPY (EGD);  Surgeon: Yetta Flock, MD;  Location: [facility] ENDOSCOPY;  Service: Gastroenterology;  Laterality: N/A;   LEFT HEART CATH AND CORONARY ANGIOGRAPHY N/A 04/17/2022   Procedure: LEFT HEART CATH AND CORONARY ANGIOGRAPHY;  Surgeon: Belva Crome, MD;  Location: [place] [facility];  Service: Cardiovascular;  Laterality: N/A;   POLYPECTOMY  04/16/2022   Procedure: POLYPECTOMY;  Surgeon: Yetta Flock, MD;  Location: [facility] ENDOSCOPY;  Service: Gastroenterology;;   RIGHT/LEFT HEART CATH AND CORONARY ANGIOGRAPHY N/A 09/24/2020   Procedure: RIGHT/LEFT HEART CATH AND CORONARY ANGIOGRAPHY;  Surgeon: Lorretta Harp, MD;  Location: [place] [facility];  Service: Cardiovascular;  Laterality: N/A;   SUBMUCOSAL TATTOO INJECTION  04/16/2022   Procedure: SUBMUCOSAL TATTOO INJECTION;  Surgeon: Yetta Flock, MD;  Location: [facility] ENDOSCOPY;  Service: Gastroenterology;;   WRIST TENODESIS       Medications Prior to Admission: Prior to Admission medications   Medication Sig Start Date End Date Taking? Authorizing Provider  amiodarone (PACERONE) 200 MG tablet Take 2 tablets (400 mg  total) by mouth daily. 04/24/22   Patrecia Pour, MD  apixaban (ELIQUIS) 5 MG TABS tablet TAKE 1 TABLET(5 MG) BY MOUTH TWICE DAILY Patient taking differently: Take 5 mg by mouth 2 (two) times daily. 04/11/22   Croitoru, [name], MD  atorvastatin (LIPITOR) 80 MG tablet TAKE ONE TABLET BY MOUTH EVERY EVENING 05/05/22   Croitoru, [name], MD  clopidogrel (PLAVIX) 75 MG tablet TAKE 1 TABLET(75 MG) BY MOUTH DAILY Patient taking differently: Take 75 mg by mouth daily. TAKE 1 TABLET(75 MG) BY MOUTH DAILY 10/02/21   Croitoru, Dani Gobble, MD  dapagliflozin propanediol (FARXIGA) 10 MG TABS tablet Take 1 tablet (10 mg total) by mouth daily. 05/08/22   Croitoru, [name], MD  ezetimibe (ZETIA) 10 MG tablet Take 1 tablet (10 mg total) by mouth daily. 03/13/22   Croitoru, [name],  MD  furosemide (LASIX) 20 MG tablet Take 2 tablets (40 mg total) by mouth 3 (three) times a week. Patient taking differently: Take 40 mg by mouth every Monday, Wednesday, and Friday. 03/12/22   [name], MD  levETIRAcetam (KEPPRA) 500 MG tablet TAKE 1 TABLET(500 MG) BY MOUTH TWICE DAILY 04/28/22   [name], MD  metoprolol succinate (TOPROL-XL) 25 MG 24 hr tablet Take one tablet, 25 mg, in the morning and 50 mg, two tablets, in the evening. Patient taking differently: 25 mg. Per patient taking 25 mg in the morning and 25 mg at night 06/27/21   Croitoru, [name], MD  oxyCODONE (OXY IR/ROXICODONE) 5 MG immediate release tablet Take 1-2 tablets (5-10 mg total) by mouth every 8 (eight) hours as needed for breakthrough pain. 05/05/22   [name], MD  pantoprazole (PROTONIX) 40 MG tablet TAKE 1 TABLET(40 MG) BY MOUTH DAILY Patient taking differently: Take 40 mg by mouth daily. TAKE 1 TABLET(40 MG) BY MOUTH DAILY 03/13/22   Croitoru, [name], MD  potassium chloride (KLOR-CON M) 10 MEQ tablet Take 1 tablet (10 mEq total) by mouth daily. 12/02/21   Croitoru, [name], MD  sacubitril-valsartan (ENTRESTO) 49-51 MG Take 1 tablet by mouth 2 (two) times daily. 10/07/21   Croitoru, [name], MD  sildenafil (VIAGRA) 50 MG tablet Take 1 tablet (50 mg total) by mouth as needed for erectile dysfunction. 01/25/21   Shirley Friar, PA-C     Allergies:   No Known Allergies  Social History:   Social History   Socioeconomic History   Marital status: Single    Spouse name: Not on file   Number of children: Not on file   Years of education: Not on file   Highest education level: Not on file  Occupational History   Not on file  Tobacco Use   Smoking status: Former    Packs/day: 1.00    Years: 30.00    Pack years: 30.00    Types: Cigarettes    Quit date: 05/31/2013    Years since quitting: 8.9   Smokeless tobacco: Never  Vaping Use   Vaping Use: Never used  Substance and Sexual Activity   Alcohol  use: Not Currently    Comment: 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    Right handed   Social  Determinants of Health   Financial Resource Strain: High Risk   Difficulty of Paying Living Expenses: Hard  Food Insecurity: Not on file  Transportation Needs: Not on file  Physical Activity: Not on file  Stress: Not on file  Social Connections: Not on file  Intimate Partner Violence: Not on file    Family History:   The patient's family history includes Brain cancer in his father; Heart disease in his mother; Hypertension in his daughter.    ROS:  Please see the history of present illness.  All other ROS reviewed and negative.     Physical Exam/Data:   Vitals:   05/08/22 0841 05/08/22 0930 05/08/22 1030 05/08/22 1100  BP: 108/80 98/72 108/80 94/70  Pulse: 81 93 94 95  Resp: 20 20 (!) 23 (!) 29  Temp: 97.9 F (36.6 C)     TempSrc: Oral     SpO2: 96% 94% 92% 92%  Weight: 95.7 kg     Height: $Remove'6\' 6"'ZQVSyHC$  (1.981 m)      No intake or output data in the 24 hours ending 05/08/22 1143    05/08/2022    8:41 AM 04/18/2022    8:17 AM  Last 3 Weights  Weight (lbs) 211 lb 211 lb  Weight (kg) 95.709 kg 95.709 kg     Body mass index is 24.38 kg/m.  General:  Well nourished, well developed, in no acute distress HEENT: normal Neck: no JVD Vascular: No carotid bruits; Distal pulses 2+ bilaterally   Cardiac:   RRR; no murmurs, gallops or rubs Lungs:  clear to auscultation bilaterally, no wheezing, rhonchi or rales  Abd: appears distended, is firm, wound appears to be healing well, more high-pitched BS upper abdomen, more normal sounding lower belly, he is tender all over, not  guarding Ext: trace  edema Musculoskeletal:  No deformities, BUE and BLE strength normal and equal Skin: warm and dry  Neuro:  CNs 2-12 intact, no focal abnormalities noted Psych:  Normal affect    EKG:  The ECG that was done today was personally reviewed and demonstrates ST 102bpm, no acute/ischemic looking changes  Relevant CV Studies:   04/17/22: [facility] CONCLUSIONS: Widely patent left main Mid segment of LAD contains 40 to 50% narrowing.  The large first diagonal contains a patent stent in the mid body.  Proximal to the stent is a 50% stenosis. Circumflex is totally occluded beyond the origin of the large first obtuse marginal.  Left-to-right collaterals are noted.  The proximal circumflex before the origin of the first obtuse marginal contains 50 to 70% stenosis.  There is progression of this lesion compared to prior. Right coronary is totally occluded proximally.  It reconstitutes in the distal segment.  PDA is totally occluded.  PDA and left ventricular branches fill via left to right collaterals. Recommendations: Per managing team. Anatomy is similar to prior with the following exceptions: 50% to 70% stenosis proximal to the large first obtuse marginal.  40 to 50% stenosis proximal to the stent segment in the first diagonal.   Echocardiogram 04/07/2022: Impressions:  1. Difficult acoustic windows Endocardium not well seen Overall there  does not appear to be a signfiicant change from echo in 2021.   2. LV function is depressed with hypokinesis of the lateral wall,  mid/distal inferior wall and apex; akinesis with aneurysmal dilitation of  inferior base. . Left ventricular ejection fraction, by estimation, is 35  to 40%. The left ventricle has  moderately decreased function. The left ventricular internal  cavity size  was moderately dilated. There is mild left ventricular hypertrophy. Left  ventricular diastolic parameters are indeterminate.   3. Right ventricular systolic function is normal. The right  ventricular  size is normal. There is mildly elevated pulmonary artery systolic  pressure.   4. Left atrial size was severely dilated.   5. Right atrial size was mildly dilated.   6. The mitral valve is normal in structure. Mild mitral valve  regurgitation.   7. The aortic valve is tricuspid. Aortic valve regurgitation is not  visualized. Aortic valve sclerosis/calcification is present, without any  evidence of aortic stenosis.   8. There is borderline dilatation of the aortic root, measuring 39 mm.  There is borderline dilatation of the ascending aorta, measuring 39 mm.   Laboratory Data:  High Sensitivity Troponin:   Recent Labs  Lab 05/08/22 0918  TROPONINIHS 32*      Chemistry Recent Labs  Lab 05/08/22 0918  NA 135  K 5.0  CL 104  CO2 16*  GLUCOSE 115*  BUN 25*  CREATININE 1.14  CALCIUM 8.3*  MG 2.6*  GFRNONAA >60  ANIONGAP 15    No results for input(s): PROT, ALBUMIN, AST, ALT, ALKPHOS, BILITOT in the last 168 hours. Lipids No results for input(s): CHOL, TRIG, HDL, LABVLDL, LDLCALC, CHOLHDL in the last 168 hours. Hematology Recent Labs  Lab 05/05/22 1628 05/08/22 0918  WBC 7.0 13.2*  RBC 5.12 5.63  HGB 12.7* 14.3  HCT 41.6  43.4 48.5  MCV 81.3 86.1  MCH 24.8* 25.4*  MCHC 30.5 29.5*  RDW 23.6* 23.9*  PLT 599* 473*   Thyroid  Recent Labs  Lab 05/08/22 0918  TSH 11.024*   BNPNo results for input(s): BNP, PROBNP in the last 168 hours.  DDimer No results for input(s): DDIMER in the last 168 hours.   Radiology/Studies:  No results found.   Assessment and Plan:   VT Long hx of the same Stable CAD, no CP, HS Trop only 32 Leukocytosis,  no symptoms of illness, afebrile SR/ST currently  TSH is up again, seems to wax/wane, probably the [facility]  D/w ER MD, will check his belly I will discuss with Dr. Lovena Le, he will see him later today   Risk Assessment/Risk Scores:    For questions or updates, please contact [facility] Please consult  [email or web address] for contact info under     Signed, Baldwin Jamaica, PA-C  05/08/2022 11:43 AM   EP Attending  Patient seen and examined. Agree with the findings as noted above. The patient presents with an ICD shock and device interrogation demonstrates a lot of VT with successful ATP. He notes that he was ok until 2 days ago when he "Pulled something" and then has not had a bm and noted that his abdomen is swelling and more tender. No obvious fever. He has had anorexia. Note metastatic CA. At this point, I think we admit for IV amio and treatment of his ileus which was seen on abd film. I would hope to avoid VT ablation due to poor long term prognosis. If VT cannot be controlled and abdomen normalizes we could consider VT ablation to try and improve his symptoms.   Carleene Overlie Corvette Orser,MD

## 2022-05-08 NOTE — Progress Notes (Addendum)
Patient ID: Gregory [name], male   DOB: 1961-10-22, 61 y.o.   MRN: [id number] [facility] Surgery Progress Note     Subjective: CC-  Gregory [name] is a 61yo male with multiple cardiac issues on eliquis and plavix (last dose 5/24) who is 3 weeks s/p lap assisted Right colectomy for adenocarcinoma by Dr. Thermon Leyland on 04/18/22. He presents to the ED today because his AICD went off x3. He also reports that he has been more bloated and having increased abdominal soreness over the last 5 or so days. He though that he had pulled a muscle in his abdomen. Pain is mostly in the right hemiabdomen. Denies any n/v but he has had decreased appetite. Last BM was 2 days ago and somewhat loose. Does not think he has passed any flatus today. Xray today shows ileus.  WBC 13.2, afebrile. Patient is being admitted to the medical service, cardiology consulting. General surgery also asked to see.  Objective: Vital signs in last 24 hours: Temp:  [97.9 F (36.6 C)] 97.9 F (36.6 C) (05/25 0841) Pulse Rate:  [81-95] 91 (05/25 1345) Resp:  [14-29] 16 (05/25 1345) BP: (94-114)/(64-80) 107/64 (05/25 1345) SpO2:  [90 %-96 %] 93 % (05/25 1345) Weight:  [95.7 kg] 95.7 kg (05/25 0841)    Intake/Output from previous day: No intake/output data recorded. Intake/Output this shift: No intake/output data recorded.  PE: Gen:  Alert, NAD, pleasant Abd: distended but soft, mild right sided tenderness without peritonitis, incisions cdi  Lab Results:  Recent Labs    05/05/22 1628 05/08/22 0918  WBC 7.0 13.2*  HGB 12.7* 14.3  HCT 41.6  43.4 48.5  PLT 599* 473*   BMET Recent Labs    05/08/22 0918  NA 135  K 5.0  CL 104  CO2 16*  GLUCOSE 115*  BUN 25*  CREATININE 1.14  CALCIUM 8.3*   PT/INR No results for input(s): LABPROT, INR in the last 72 hours. CMP     Component Value Date/Time   NA 135 05/08/2022 0918   NA 137 04/11/2022 1219   K 5.0 05/08/2022 0918   CL 104  05/08/2022 0918   CO2 16 (L) 05/08/2022 0918   GLUCOSE 115 (H) 05/08/2022 0918   BUN 25 (H) 05/08/2022 0918   BUN 13 04/11/2022 1219   CREATININE 1.14 05/08/2022 0918   CREATININE 1.08 12/12/2016 1445   CALCIUM 8.3 (L) 05/08/2022 0918   PROT 5.7 (L) 04/21/2022 0950   PROT 6.5 02/28/2022 1304   ALBUMIN 2.3 (L) 04/21/2022 0950   ALBUMIN 4.0 02/28/2022 1304   AST 28 04/21/2022 0950   ALT 53 (H) 04/21/2022 0950   ALKPHOS 43 04/21/2022 0950   BILITOT 1.0 04/21/2022 0950   BILITOT 0.2 02/28/2022 1304   GFRNONAA >60 05/08/2022 0918   GFRAA 91 11/15/2020 0959   Lipase     Component Value Date/Time   LIPASE 120 (H) 09/09/2020 1810       Studies/Results: DG Abdomen Acute W/Chest  Result Date: 05/08/2022 CLINICAL DATA:  Abdominal pain EXAM: DG ABDOMEN ACUTE WITH 1 VIEW CHEST COMPARISON:  Radiograph 04/21/2022 FINDINGS: Diffuse gaseous distension of small and large bowel with some dilated small bowel loops noted. There is no evidence of free intraperitoneal gas. No abnormal calcifications. Cardiomegaly with unchanged pacemaker/AICD leads. There are bibasilar opacities, left greater than right. No large pleural effusion. No visible pneumothorax. There is no acute osseous abnormality. Degenerative changes of the spine. 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  IMPRESSION: Diffuse gaseous distension of small and large bowel with some dilated small bowel loops. Findings are compatible with ileus. Bibasilar airspace opacities, could be atelectasis or infection. Electronically Signed   By: Maurine Simmering M.D.   On: 05/08/2022 13:15    Anti-infectives: Anti-infectives (From admission, onward)    None        Assessment/Plan POD#20, s/p lap assisted R colectomy for adenocarcinoma, Dr. Thermon Leyland 04/18/22 - Path w/ Adenocarcinoma, moderate to poorly differentiated, 6.4 cm; Metastatic adenocarcinoma involving eleven of thirty-five lymph nodes (11/35);  Lymphovascular space invasion present; Margins uninvolved by carcinoma. Has seen Dr. Burr Medico - discharged 5/12 and readmitted 5/25 - Will obtain CT scan to rule out intraabdominal complication contributing to his ileus   FEN - IVF, NPO VTE - hold Eliquis & plavix if possible, ok for heparin gtt or sqh/ lovenox  ID - none currently   - Per TRH/Cards -  CAD with history of PCI to LAD Recurrent ventricular tachycardia status post AICD placement Ischemic cardiomyopathy CHF, systolic and diastolic, LV EF 35 to 92% Atrial fibrillation    LOS: 0 days    Wellington Hampshire, [facility] Surgery 05/08/2022, 1:58 PM Please see Amion for pager number during day hours 7:00am-4:30pm

## 2022-05-08 NOTE — ED Triage Notes (Signed)
Pt BIBA for internal defib firing this AM. Pt was using the bathroom and started to have some SOB. Pt rpeorts starting to feel like it was going to happen. Only fired 1 time. No LOC. Pt not having any pain. PT converted on his own with EMS from A flutter to NSR/Sinus tack.

## 2022-05-08 NOTE — Assessment & Plan Note (Addendum)
Echo 04/07/2022: LVEF 35-40%, worse compared to 09/22/2020 (LVEF 40-45%). Clinically euvolemic.  - Continue farxiga -holding metoprolol XL and Entresto with soft bp/hypotension

## 2022-05-08 NOTE — ED Notes (Signed)
Got patient undressed on the monitor did ekg shown to Dr Laverta Baltimore patient is resting with call bell in reach

## 2022-05-08 NOTE — Telephone Encounter (Signed)
The patient got shocked. His monitor did not work. I let him speak with Portia, rn.

## 2022-05-08 NOTE — Assessment & Plan Note (Signed)
Continue keppra 

## 2022-05-09 ENCOUNTER — Inpatient Hospital Stay (HOSPITAL_COMMUNITY): Payer: Commercial Managed Care - HMO

## 2022-05-09 ENCOUNTER — Other Ambulatory Visit: Payer: Self-pay

## 2022-05-09 ENCOUNTER — Encounter (HOSPITAL_COMMUNITY): Payer: Self-pay | Admitting: Family Medicine

## 2022-05-09 DIAGNOSIS — I472 Ventricular tachycardia, unspecified: Secondary | ICD-10-CM | POA: Diagnosis not present

## 2022-05-09 HISTORY — PX: IR IMAGE GUIDED DRAINAGE PERCUT CATH  PERITONEAL RETROPERIT: IMG5467

## 2022-05-09 HISTORY — PX: IR ANGIOGRAM SELECTIVE EACH ADDITIONAL VESSEL: IMG667

## 2022-05-09 HISTORY — PX: IR EMBO ART  VEN HEMORR LYMPH EXTRAV  INC GUIDE ROADMAPPING: IMG5450

## 2022-05-09 HISTORY — PX: IR ANGIOGRAM VISCERAL SELECTIVE: IMG657

## 2022-05-09 HISTORY — PX: IR US GUIDE VASC ACCESS RIGHT: IMG2390

## 2022-05-09 LAB — PROTIME-INR
INR: 1.6 — ABNORMAL HIGH (ref 0.8–1.2)
Prothrombin Time: 18.6 seconds — ABNORMAL HIGH (ref 11.4–15.2)

## 2022-05-09 LAB — BASIC METABOLIC PANEL
Anion gap: 8 (ref 5–15)
BUN: 25 mg/dL — ABNORMAL HIGH (ref 8–23)
CO2: 22 mmol/L (ref 22–32)
Calcium: 7.9 mg/dL — ABNORMAL LOW (ref 8.9–10.3)
Chloride: 100 mmol/L (ref 98–111)
Creatinine, Ser: 0.96 mg/dL (ref 0.61–1.24)
GFR, Estimated: >60 mL/min (ref >60)
Glucose, Bld: 95 mg/dL (ref 70–99)
Potassium: 4.8 mmol/L (ref 3.5–5.1)
Sodium: 130 mmol/L — ABNORMAL LOW (ref 135–145)

## 2022-05-09 LAB — CBC
HCT: 46.1 % (ref 39.0–52.0)
Hemoglobin: 13.5 g/dL (ref 13.0–17.0)
MCH: 24.2 pg — ABNORMAL LOW (ref 26.0–34.0)
MCHC: 29.3 g/dL — ABNORMAL LOW (ref 30.0–36.0)
MCV: 82.8 fL (ref 80.0–100.0)
Platelets: 438 10*3/uL — ABNORMAL HIGH (ref 150–400)
RBC: 5.57 MIL/uL (ref 4.22–5.81)
RDW: 24.2 % — ABNORMAL HIGH (ref 11.5–15.5)
WBC: 17.9 10*3/uL — ABNORMAL HIGH (ref 4.0–10.5)
nRBC: 0 % (ref 0.0–0.2)

## 2022-05-09 LAB — T3: T3, Total: 62 ng/dL — ABNORMAL LOW (ref 71–180)

## 2022-05-09 LAB — MAGNESIUM: Magnesium: 2.2 mg/dL (ref 1.7–2.4)

## 2022-05-09 LAB — GLUCOSE, CAPILLARY
Glucose-Capillary: 93 mg/dL (ref 70–99)
Glucose-Capillary: 96 mg/dL (ref 70–99)
Glucose-Capillary: 76 mg/dL (ref 70–99)
Glucose-Capillary: 113 mg/dL — ABNORMAL HIGH (ref 70–99)

## 2022-05-09 MED ORDER — MIDAZOLAM HCL 2 MG/2ML IJ SOLN
INTRAMUSCULAR | Status: AC | PRN
Start: 2022-05-09 — End: 2022-05-09
  Administered 2022-05-09 (×2): .5 mg via INTRAVENOUS

## 2022-05-09 MED ORDER — IOHEXOL 350 MG/ML SOLN
70.0000 mL | Freq: Once | INTRAVENOUS | Status: AC | PRN
  Administered 2022-05-09: 70 mL via INTRAVENOUS

## 2022-05-09 MED ORDER — SODIUM CHLORIDE 0.9% FLUSH
5.0000 mL | Freq: Three times a day (TID) | INTRAVENOUS | Status: DC
  Administered 2022-05-09 – 2022-05-14 (×13): 5 mL

## 2022-05-09 MED ORDER — IOHEXOL 300 MG/ML  SOLN
100.0000 mL | Freq: Once | INTRAMUSCULAR | Status: AC | PRN
  Administered 2022-05-09: 30 mL via INTRA_ARTERIAL

## 2022-05-09 MED ORDER — LIDOCAINE HCL 1 % IJ SOLN
INTRAMUSCULAR | Status: AC
  Administered 2022-05-09: 10 mL
  Filled 2022-05-09: qty 20

## 2022-05-09 MED ORDER — FENTANYL CITRATE (PF) 100 MCG/2ML IJ SOLN
INTRAMUSCULAR | Status: AC
  Filled 2022-05-09: qty 2

## 2022-05-09 MED ORDER — MIDAZOLAM HCL 2 MG/2ML IJ SOLN
INTRAMUSCULAR | Status: AC
  Filled 2022-05-09: qty 2

## 2022-05-09 MED ORDER — HYDROMORPHONE HCL 1 MG/ML IJ SOLN
INTRAMUSCULAR | Status: AC | PRN
  Administered 2022-05-09: 1 mg via INTRAMUSCULAR

## 2022-05-09 MED ORDER — HYDROMORPHONE HCL 1 MG/ML IJ SOLN
INTRAMUSCULAR | Status: AC
  Filled 2022-05-09: qty 1

## 2022-05-09 MED ORDER — IOHEXOL 300 MG/ML  SOLN
100.0000 mL | Freq: Once | INTRAMUSCULAR | Status: AC | PRN
  Administered 2022-05-09: 60 mL via INTRAVENOUS

## 2022-05-09 MED ORDER — FENTANYL CITRATE (PF) 100 MCG/2ML IJ SOLN
INTRAMUSCULAR | Status: AC | PRN
  Administered 2022-05-09 (×2): 25 ug via INTRAVENOUS

## 2022-05-09 NOTE — Progress Notes (Signed)
Notified by nursing staff of pt with runs of VT which are resolved with ATP (both episodes). One episode approximately 0116 and another 0141. Pt asymptomatic, denies CP/SOB. TRH and Cards notified and awaiting further orders. K+ 5.0, Mg 2.6. On amiodarone gtt at '30mg'$ /hr. Will continue to monitor.   0121-HR 92 SR with 1st degree HB, 119/94 (102), RR 16 with sats 94% on 2L [place].

## 2022-05-09 NOTE — TOC Initial Note (Signed)
Transition of Care [facility]) - Initial/Assessment Note    Patient Details  Name: Gregory [name] MRN: [id number] Date of Birth: [birth year]  Transition of Care [facility]) CM/SW Contact:    Bethena Roys, RN Phone Number: 05/09/2022, 12:38 PM  Clinical Narrative:  Risk for readmission assessment completed. PTA patient states he was from home with his son and has support of both son and daughter. Patient does not drive and states children take him to appointments and gets his medications without any issues. Patient states he will have transportation home. Case Manager discussed home health services- and patient feels like he will not need services. Case Manager will continue to follow for additional transition of care needs.   Expected Discharge Plan: Home/Self Care Barriers to Discharge: Continued Medical Work up, ED Unable to Make Contact with Facility/Family   Patient Goals and CMS Choice Patient states their goals for this hospitalization and ongoing recovery are:: pt wants to return home.   Choice offered to / list presented to : NA  Expected Discharge Plan and Services Expected Discharge Plan: Home/Self Care In-house Referral: NA Discharge Planning Services: CM Consult Post Acute Care Choice: NA Living arrangements for the past 2 months: Single Family Home                 DME Arranged: N/A DME Agency: NA       HH Arranged: NA          Prior Living Arrangements/Services Living arrangements for the past 2 months: Single Family Home Lives with:: Adult Children Patient language and need for interpreter reviewed:: Yes Do you feel safe going back to the place where you live?: Yes      Need for Family Participation in Patient Care: No (Comment) Care giver support system in place?: No (comment) Current home services: DME (cane) Criminal Activity/Legal Involvement Pertinent to Current Situation/Hospitalization: No - Comment as needed  Activities of Daily  Living Home Assistive Devices/Equipment: CPAP ADL Screening (condition at time of admission) Patient's cognitive ability adequate to safely complete daily activities?: Yes Is the patient deaf or have difficulty hearing?: No Does the patient have difficulty seeing, even when wearing glasses/contacts?: No Does the patient have difficulty concentrating, remembering, or making decisions?: No Patient able to express need for assistance with ADLs?: Yes Does the patient have difficulty dressing or bathing?: No Independently performs ADLs?: Yes (appropriate for developmental age) Does the patient have difficulty walking or climbing stairs?: No Weakness of Legs: Both Weakness of Arms/Hands: None  Permission Sought/Granted Permission sought to share information with : Case Manager, Customer service manager, Family Supports        Emotional Assessment Appearance:: Appears stated age Attitude/Demeanor/Rapport: Engaged Affect (typically observed): Appropriate Orientation: : Oriented to Situation, Oriented to Place, Oriented to Self, Oriented to  Time Alcohol / Substance Use: Not Applicable Psych Involvement: No (comment)  Admission diagnosis:  Ileus ([place]) [K56.7] Ventricular tachycardia ([place]) [I47.20] Recurrent ventricular tachycardia ([place]) [I47.20] Patient Active Problem List   Diagnosis Date Noted   Controlled type 2 diabetes mellitus without complication, without long-term current use of insulin ([place]) 05/08/2022   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) 05/08/2022   Hypothyroidism 05/08/2022   Leukocytosis 05/08/2022   Postoperative ileus ([place]) 04/22/2022   Cancer of right colon ([place]) 04/17/2022   Benign neoplasm of colon    Anticoagulated    Symptomatic anemia/iron deficiency anemia 04/12/2022   ICD (implantable cardioverter-defibrillator) in place 03/21/2022  Slow transit constipation    Lipoma     Sleep disturbance    Hypoalbuminemia due to protein-calorie malnutrition (HCC)    Seizures (HCC)    Paroxysmal atrial fibrillation ([place])    Debility 09/29/2020   Shock ([place]) 09/13/2020   Thrombocytopenia (HCC)    Localization-related idiopathic epilepsy and epileptic syndromes with seizures of localized onset, not intractable, without status epilepticus ([place]) 10/20/2018   Chronic combined systolic and diastolic CHF (congestive heart failure) (HCC)/ischemic cardiomyopathy 12/01/2013   CAD S/P percutaneous coronary angioplasty 12/01/2013   Erectile dysfunction 12/01/2013   Essential hypertension 12/01/2013   Hypercholesterolemia 12/01/2013   OSA on CPAP 12/01/2013   PCP:  Patient, [name] Pharmacy:   [facility] [place], [place] [address] [place] [address] Phone: [phone] Fax: [facility] [phone] - [place], [place] [address] [place] [address] Phone: [phone] Fax: [phone]  [name] [address] [place] [address] Phone: [phone] Fax: [phone]   Readmission Risk Interventions    04/22/2022   12:49 PM  Readmission Risk Prevention Plan  Transportation Screening Complete  PCP or Specialist Appt within 3-5 Days Complete  HRI or [place] Complete  Social Work Consult for [place] Planning/Counseling Complete  Palliative Care Screening Not Applicable  Medication Review Press photographer) Complete

## 2022-05-09 NOTE — Progress Notes (Signed)
Pt complains of fullness in the bladder. Pt assisted to try to void x 3 but still unable to void. Bladder scanned showing 354 ml. Dr Hal Hope notified and ordered for in and out cath. In and out cath done aseptically and obtained 500 ml urine. Will continue to monitor pt.

## 2022-05-09 NOTE — Consult Note (Signed)
Chief Complaint: Patient was seen in consultation today for intra-abdominal fluid collection, splenic artery pseudoaneurysm  Referring Physician(s): Dr. Romana Juniper  Supervising Physician: Mir, Sharen Heck  Patient Status: [facility] - In-pt  History of Present Illness: Gregory [name] is a 61 y.o. male with past medical history of AICD, CAD, HTN, CAD with prior stent placements on Plavix '75mg'$  daily at home recently admitted 4/28-5/12/23 for partially obstructing colon mass.  He underwent biopsy with hand-assisted laparoscopic right colectomy 5/5 as well as cardiac catheterization. He presented to the ED yesterday morning after his ICD fired at home.  He also complained of abdominal pain with distention. EP was consulted and started him on amiodarone, Eliquis and Plavix currently on hold.    CT Abdomen Pelvis showed:  Large postoperative abscess in the abdomen/pelvis, measuring at least 14.8 x 28.2 x 20.6 cm. Consider surgical washout versus drainage by interventional radiology, as clinically warranted.   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 Interventional radiology consultation is suggested for embolization.  IR consulted for aspiration and drainage as well as possible embolization.   Dr. Dwaine Gale has discussed with Dr. Kae Heller and is agreeable to intra-abdominal abscess aspiration and drainage.  Angiogram with possible embolization also being considered as CTA showed small amount of contrast extravasation involving a distal branch of the splenic artery which is seen supplying pseudoaneurysm in the splenic hilum, suggesting arterial injury. Patient recently took Plavix at home.  INR overnight was 1.7 He has been NPO.   Past Medical History:  Diagnosis Date   AICD (automatic cardioverter/defibrillator) present 2005   CAD (coronary artery disease) 12/01/2013   Chronic combined  systolic and diastolic CHF, NYHA class 1 ([place]) 12/01/2013   Erectile dysfunction 12/01/2013   HTN (hypertension) 12/01/2013   Hyperlipidemia 12/01/2013   Ischemic cardiomyopathy 12/01/2013   Presence of permanent cardiac pacemaker    Sleep apnea     Past Surgical History:  Procedure Laterality Date   ACHILLES TENDON REPAIR     lft foot   BIOPSY  04/16/2022   Procedure: BIOPSY;  Surgeon: Yetta Flock, MD;  Location: [place];  Service: Gastroenterology;;   CARDIAC CATHETERIZATION N/A 05/05/2016   Procedure: Left Heart Cath and Coronary Angiography;  Surgeon: Leonie Man, MD;  Location: [place] [facility];  Service: Cardiovascular;  Laterality: N/A;   CARDIAC DEFIBRILLATOR PLACEMENT     CARDIOVERSION N/A 09/13/2020   Procedure: CARDIOVERSION;  Surgeon: Werner Lean, MD;  Location: [place] [facility];  Service: Cardiovascular;  Laterality: N/A;   CARDIOVERSION N/A 09/19/2020   Procedure: CARDIOVERSION;  Surgeon: [name], MD;  Location: [facility] ENDOSCOPY;  Service: Cardiovascular;  Laterality: N/A;   COLON RESECTION N/A 04/18/2022   Procedure: HAND ASSISTED LAPAROSCOPIC RIGHT COLON RESECTION;  Surgeon: Felicie Morn, MD;  Location: [place];  Service: General;  Laterality: N/A;   COLONOSCOPY WITH PROPOFOL N/A 04/16/2022   Procedure: COLONOSCOPY WITH PROPOFOL;  Surgeon: Yetta Flock, MD;  Location: [place];  Service: Gastroenterology;  Laterality: N/A;   EP IMPLANTABLE DEVICE N/A 12/19/2016   Procedure: ICD Generator Changeout;  Surgeon: [name], MD;  Location: [place] [facility];  Service: Cardiovascular;  Laterality: N/A;   ESOPHAGOGASTRODUODENOSCOPY N/A 04/16/2022   Procedure: ESOPHAGOGASTRODUODENOSCOPY (EGD);  Surgeon: Yetta Flock, MD;  Location: [facility] ENDOSCOPY;  Service: Gastroenterology;  Laterality: N/A;   LEFT HEART CATH AND CORONARY ANGIOGRAPHY N/A 04/17/2022   Procedure: LEFT HEART CATH  AND CORONARY ANGIOGRAPHY;  Surgeon: Belva Crome, MD;   Location: [place] [facility];  Service: Cardiovascular;  Laterality: N/A;   POLYPECTOMY  04/16/2022   Procedure: POLYPECTOMY;  Surgeon: Yetta Flock, MD;  Location: [facility] END[facility]COPY;  Service: Gastroenterology;;   RIGHT/LEFT HEART CATH AND CORONARY ANGIOGRAPHY N/A 09/24/2020   Procedure: RIGHT/LEFT HEART CATH AND CORONARY ANGIOGRAPHY;  Surgeon: Lorretta Harp, MD;  Location: [place] [facility];  Service: Cardiovascular;  Laterality: N/A;   SUBMUCOSAL TATTOO INJECTION  04/16/2022   Procedure: SUBMUCOSAL TATTOO INJECTION;  Surgeon: Yetta Flock, MD;  Location: [name] ENDOSCOPY;  Service: Gastroenterology;;   WRIST TENODESIS      Allergies: Patient has no known allergies.  Medications: Prior to Admission medications   Medication Sig Start Date End Date Taking? Authorizing Provider  acetaminophen (TYLENOL) 500 MG tablet Take 1,000 mg by mouth every 6 (six) hours as needed for moderate pain.   Yes [provider]  amiodarone (PACERONE) 200 MG tablet Take 2 tablets (400 mg total) by mouth daily. 04/24/22  Yes Patrecia Pour, MD  apixaban (ELIQUIS) 5 MG TABS tablet TAKE 1 TABLET(5 MG) BY MOUTH TWICE DAILY Patient taking differently: Take 5 mg by mouth 2 (two) times daily. 04/11/22  Yes Croitoru, [name], MD  atorvastatin (LIPITOR) 80 MG tablet TAKE ONE TABLET BY MOUTH EVERY EVENING Patient taking differently: Take 80 mg by mouth every evening. 05/05/22  Yes Croitoru, [name], MD  clopidogrel (PLAVIX) 75 MG tablet TAKE 1 TABLET(75 MG) BY MOUTH DAILY Patient taking differently: Take 75 mg by mouth daily. TAKE 1 TABLET(75 MG) BY MOUTH DAILY 10/02/21  Yes Croitoru, [name], MD  dapagliflozin propanediol (FARXIGA) 10 MG TABS tablet Take 1 tablet (10 mg total) by mouth daily. 05/08/22  Yes Croitoru, [name], MD  ezetimibe (ZETIA) 10 MG tablet Take 1 tablet (10 mg total) by mouth daily. 03/13/22  Yes Croitoru, [name], MD  furosemide (LASIX) 20 MG tablet Take 2 tablets (40 mg total) by mouth 3 (three)  times a week. Patient taking differently: Take 40 mg by mouth every Monday, Wednesday, and Friday. 03/12/22  Yes [name], MD  levETIRAcetam (KEPPRA) 500 MG tablet TAKE 1 TABLET(500 MG) BY MOUTH TWICE DAILY Patient taking differently: Take 500 mg by mouth 2 (two) times daily. 04/28/22  Yes [name], MD  metoprolol succinate (TOPROL-XL) 25 MG 24 hr tablet Take one tablet, 25 mg, in the morning and 50 mg, two tablets, in the evening. Patient taking differently: 25 mg. Per patient taking 25 mg in the morning and 25 mg at night 06/27/21  Yes Croitoru, [name], MD  oxyCODONE (OXY IR/ROXICODONE) 5 MG immediate release tablet Take 1-2 tablets (5-10 mg total) by mouth every 8 (eight) hours as needed for breakthrough pain. 05/05/22  Yes [name], MD  potassium chloride (KLOR-CON M) 10 MEQ tablet Take 1 tablet (10 mEq total) by mouth daily. 12/02/21  Yes Croitoru, [name], MD  sacubitril-valsartan (ENTRESTO) 49-51 MG Take 1 tablet by mouth 2 (two) times daily. 10/07/21  Yes Croitoru, [name], MD  pantoprazole (PROTONIX) 40 MG tablet TAKE 1 TABLET(40 MG) BY MOUTH DAILY Patient taking differently: Take 40 mg by mouth daily. TAKE 1 TABLET(40 MG) BY MOUTH DAILY 03/13/22   Croitoru, [name], MD  sildenafil (VIAGRA) 50 MG tablet Take 1 tablet (50 mg total) by mouth as needed for erectile dysfunction. 01/25/21   Shirley Friar, PA-C     Family History  Problem Relation Age of Onset  Heart disease Mother    Brain cancer Father    Hypertension Daughter     Social History   Socioeconomic History   Marital status: Single    Spouse name: Not on file   Number of children: Not on file   Years of education: Not on file   Highest education level: Not on file  Occupational History   Not on file  Tobacco Use   Smoking status: Former    Packs/day: 1.00    Years: 30.00    Pack years: 30.00    Types: Cigarettes    Quit date: 05/31/2013    Years since quitting: 8.9   Smokeless tobacco: Never   Vaping Use   Vaping Use: Never used  Substance and Sexual Activity   Alcohol use: Not Currently    Comment: 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    Right handed   Social Determinants of Health   Financial Resource Strain: High Risk   Difficulty of Paying Living Expenses: Hard  Food Insecurity: Not on file  Transportation Needs: Not on file  Physical Activity: Not on file  Stress: Not on file  Social Connections: Not on file     Review of Systems: A 12 point ROS discussed and pertinent positives are indicated in the HPI above.  All other systems are negative.  Review of Systems  Constitutional:  Negative for fatigue and fever.  Respiratory:  Negative for cough and shortness of breath.   Cardiovascular:  Negative for chest pain.  Gastrointestinal:  Negative for abdominal pain.  Musculoskeletal:  Negative for back pain.  Psychiatric/Behavioral:  Negative for behavioral problems and confusion.    Vital Signs: BP 114/70 (BP Location: Left Arm)   Pulse 84   Temp (!) 97.5 F (36.4 C) (Oral)   Resp 16   Ht '6\' 6"'$  (1.981 m)   Wt 225 lb 1.4 oz (102.1 kg)   SpO2 96%   BMI 26.01 kg/m   Physical Exam Vitals and nursing note reviewed.  Constitutional:      General: He is not in acute distress.    Appearance: He is well-developed. He is not ill-appearing.  Cardiovascular:     Rate and Rhythm: Normal rate and regular rhythm.  Pulmonary:     Effort: Pulmonary effort is normal.  Skin:    General: Skin is warm and dry.  Neurological:     General: No focal deficit present.     Mental Status: He is alert and oriented to person, place, and time.  Psychiatric:        Mood and Affect: Mood  normal.        Behavior: Behavior normal.     MD Evaluation Airway: WNL Heart: WNL Abdomen: WNL Chest/ Lungs: WNL ASA  Classification: 3 Mallampati/Airway Score: Two   Imaging: DG Chest 2 View  Result Date: 04/11/2022 CLINICAL DATA:  Shortness of breath. EXAM: CHEST - 2 VIEW COMPARISON:  09/14/2020. FINDINGS: The heart size and mediastinal contours are stable. No consolidation, effusion, or pneumothorax. A calcified granuloma is present at the left lung base. Minimal atelectasis or scarring is noted at the left lung base. A  multi lead pacemaker device is present over the left chest. Degenerative changes are present in the thoracic spine. No acute osseous abnormality. IMPRESSION: Mild atelectasis or scarring at the left lung base. Electronically Signed   By: [name] M.D.   On: 04/11/2022 21:57   DG Abd 1 View  Result Date: 04/21/2022 CLINICAL DATA:  Ileus EXAM: ABDOMEN - 1 VIEW COMPARISON:  04/16/2022 FINDINGS: Gaseous distension of the small bowel and colon likely reflecting an ileus. Small amount of contrast in the splenic flexure. No bowel dilatation to suggest obstruction. No evidence of pneumoperitoneum, portal venous gas or pneumatosis. No pathologic calcifications along the expected course of the ureters. No acute osseous abnormality. IMPRESSION: 1. Gaseous distension of the small bowel and colon likely reflecting an ileus. Electronically Signed   By: Kathreen Devoid M.D.   On: 04/21/2022 10:39   CT ANGIO ABDOMEN W &/OR WO CONTRAST  Result Date: 05/09/2022 CLINICAL DATA:  Splenic artery dissection. EXAM: CT ANGIOGRAPHY ABDOMEN TECHNIQUE: Multidetector CT imaging of the abdomen was performed using the standard protocol during bolus administration of intravenous contrast. Multiplanar reconstructed images and MIPs were obtained and reviewed to evaluate the vascular anatomy. RADIATION DOSE REDUCTION: This exam was performed according to the departmental dose-optimization program which  includes automated exposure control, adjustment of the mA and/or kV according to patient size and/or use of iterative reconstruction technique. CONTRAST:  3m OMNIPAQUE IOHEXOL 350 MG/ML SOLN COMPARISON:  CT of May 08, 2022. FINDINGS: VASCULAR Aorta: Atherosclerosis of abdominal aorta is noted without dissection. 3 cm fusiform infrarenal abdominal aortic aneurysm is noted. 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 No thrombus is noted. 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 The common hepatic, left gastric and splenic arteries appear to be patent. There does appear to be some extravasation of contrast arising from a distal branch of the splenic artery in the region of the splenic hilum, best seen on image number 33 of series 5. 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 SMA: Patent without evidence of aneurysm, dissection, vasculitis or significant stenosis. Renals: Both renal arteries are patent without evidence of aneurysm, dissection, vasculitis, fibromuscular dysplasia or significant stenosis. IMA: Patent without evidence of aneurysm, dissection, vasculitis or significant stenosis. Inflow: Patent without evidence of aneurysm, dissection, vasculitis or significant stenosis. Veins: No obvious venous abnormality within the limitations of this arterial phase study. Review of the MIP images confirms the above findings. NON-VASCULAR Lower chest: Bilateral pleural effusions are noted with adjacent subsegmental atelectasis. Hepatobiliary: Gallstone is noted. Dilated gallbladder is noted. No biliary dilatation is noted. Liver is otherwise unremarkable. Pancreas: There is again noted peripancreatic fluid collections including 10.6 x 4.4 cm bilobed abnormality which appears to contain a pseudoaneurysm as described above. Spleen: Normal in size. No definite splenic parenchymal  abnormality is noted. Adrenals/Urinary Tract: Bilateral adrenal nodules are again noted. No hydronephrosis or renal obstruction is noted. Stomach/Bowel: Stomach is unremarkable. No definite evidence of bowel obstruction is noted. Lymphatic: No definite adenopathy is noted. Other: There is again noted a large complex fluid collection involving most of the right-sided the abdomen that measures 25 x 15 cm. Multiple air-fluid levels are noted within it suggesting internal loculations. Musculoskeletal: No acute or significant osseous findings. 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 There is  also noted small amount of contrast extravasation involving a distal branch of the splenic artery which is seen supplying pseudoaneurysm in the splenic hilum, suggesting arterial injury. These results will be called to the ordering clinician or representative by the Radiologist Assistant, and communication documented in the PACS or zVision Dashboard. 3 cm infrarenal abdominal aortic aneurysm is noted. Recommend follow-up ultrasound every 3 years. This recommendation follows ACR consensus guidelines: White Paper of the ACR Incidental Findings Committee II on Vascular Findings. J Am Coll Radiol 2013; 10:789-794. NON-VASCULAR Continued presence of large complex fluid collection in the right-sided the abdomen measuring 25 x 15 cm. This is concerning for abscess. Multiple air-fluid levels are noted within it suggesting internal loculations. Continued presence of peripancreatic fluid collections including 10.6 x 4.4 cm bilobed abnormality around the pancreatic tail which contains pseudoaneurysm described above. Bilateral adrenal nodules are again noted. Bilateral pleural effusions are again noted with associated atelectasis. Aortic Atherosclerosis (ICD10-I70.0). Electronically Signed   By: Marijo Conception M.D.   On: 05/09/2022 09:52   CT  ABDOMEN PELVIS W CONTRAST  Addendum Date: 05/08/2022   ADDENDUM REPORT: 05/08/2022 19:34 ADDENDUM: These results were called by telephone at the time of interpretation on 05/08/2022 at 7:15 pm to provider Dr Georganna Skeans, who verbally acknowledged these results. Electronically Signed   By: Julian Hy M.D.   On: 05/08/2022 19:34   Result Date: 05/08/2022 CLINICAL DATA:  Abdominal pain, ileus, status post surgery EXAM: CT ABDOMEN AND PELVIS WITH CONTRAST TECHNIQUE: Multidetector CT imaging of the abdomen and pelvis was performed using the standard protocol following bolus administration of intravenous contrast. RADIATION DOSE REDUCTION: This exam was performed according to the departmental dose-optimization program which includes automated exposure control, adjustment of the mA and/or kV according to patient size and/or use of iterative reconstruction technique. CONTRAST:  119m OMNIPAQUE IOHEXOL 300 MG/ML  SOLN COMPARISON:  Abdominal radiograph dated 05/08/2022. CT abdomen/pelvis dated 04/16/2022. FINDINGS: Lower chest: Moderate left and small right pleural effusions. Bilateral lower lobe atelectasis. Hepatobiliary: Subcentimeter hepatic cysts. Gallbladder is notable for a 6 mm layering gallstone (series 3/image 40). No associated inflammatory changes. No intrahepatic or extrahepatic duct dilatation. Pancreas: Pancreas is notable for peripancreatic fluid collections, including a dominant 4.9 cm collection along the pancreatic tail (series 3/image 28), previously 4.4 cm. Within this collection, there has been development of a 2.7 cm pseudoaneurysm (series 3/image 25), which is new from the prior. Spleen: Within normal limits. Adrenals/Urinary Tract: Multiple bilateral adrenal nodules, measuring up to 2.0 cm on the left (series 8/image 15), likely reflecting benign adrenal adenomas, unchanged. Multiple bilateral renal cysts, measuring up to 2.7 cm in the medial left upper pole (series 8/image 18), benign.  No hydronephrosis. Bladder is within normal limits. Stomach/Bowel: Stomach is notable for a tiny hiatal hernia. Status post right hemicolectomy with appendectomy. No evidence of bowel obstruction. Vascular/Lymphatic: Infrarenal abdominal aorta measures up to 3.0 cm (series 3/image 56). Atherosclerotic calcifications of the abdominal aorta and branch vessels. No suspicious abdominopelvic lymphadenopathy. Reproductive: Prostate is unremarkable. Other: Large fluid in gas collection with enhancing rim in the abdomen/pelvis, measuring at least 14.8 x 28.2 x 20.6 cm, reflecting a postoperative abscess. Additional mild abdominopelvic ascites. No free air. Musculoskeletal: Mild degenerative changes of the visualized thoracolumbar spine. IMPRESSION: Large postoperative abscess in the abdomen/pelvis, measuring at least 14.8 x 28.2 x 20.6 cm. Consider surgical washout versus drainage by interventional radiology, as clinically warranted. Interval development of a 2.7 cm pseudoaneurysm along the pancreatic tail/splenic hilum, within  a mildly progressive pancreatic pseudocyst. Interventional radiology consultation is suggested for embolization. Additional ancillary findings as above. Aortic Atherosclerosis (ICD10-I70.0). Electronically Signed: By: Julian Hy M.D. On: 05/08/2022 19:09   CARDIAC CATHETERIZATION  Result Date: 04/17/2022 CONCLUSIONS: Widely patent left main Mid segment of LAD contains 40 to 50% narrowing.  The large first diagonal contains a patent stent in the mid body.  Proximal to the stent is a 50% stenosis. Circumflex is totally occluded beyond the origin of the large first obtuse marginal.  Left-to-right collaterals are noted.  The proximal circumflex before the origin of the first obtuse marginal contains 50 to 70% stenosis.  There is progression of this lesion compared to prior. Right coronary is totally occluded proximally.  It reconstitutes in the distal segment.  PDA is totally occluded.  PDA and  left ventricular branches fill via left to right collaterals. Recommendations: Per managing team. Anatomy is similar to prior with the following exceptions: 50% to 70% stenosis proximal to the large first obtuse marginal.  40 to 50% stenosis proximal to the stent segment in the first diagonal.  CT CHEST ABDOMEN PELVIS W CONTRAST  Result Date: 04/17/2022 CLINICAL DATA:  61 year old male with history of malignant looking colon mass on colonoscopy today. Evaluate for metastatic disease. * Tracking Code: BO * EXAM: CT CHEST, ABDOMEN, AND PELVIS WITH CONTRAST TECHNIQUE: Multidetector CT imaging of the chest, abdomen and pelvis was performed following the standard protocol during bolus administration of intravenous contrast. RADIATION DOSE REDUCTION: This exam was performed according to the departmental dose-optimization program which includes automated exposure control, adjustment of the mA and/or kV according to patient size and/or use of iterative reconstruction technique. CONTRAST:  153m OMNIPAQUE IOHEXOL 300 MG/ML  SOLN COMPARISON:  No priors. FINDINGS: CT CHEST FINDINGS Cardiovascular: Heart size is mildly enlarged with left atrial dilatation. There is no significant pericardial fluid, thickening or pericardial calcification. There is aortic atherosclerosis, as well as atherosclerosis of the great vessels of the mediastinum and the coronary arteries, including calcified atherosclerotic plaque in the left main, left anterior descending, left circumflex and right coronary arteries. Mild thickening and calcification of the aortic valve. 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 Mediastinum/Nodes: No pathologically enlarged mediastinal or hilar lymph nodes. Esophagus is unremarkable in appearance. No axillary lymphadenopathy. Lungs/Pleura: Small calcified granuloma in the periphery of the left lower lobe. No other definite suspicious  appearing pulmonary nodules or masses are noted. Small to moderate left pleural effusion lying dependently. No definite pleural thickening or nodular hyperenhancement. No right pleural effusion. Passive atelectasis in the dependent portion of the left lower lobe. No consolidative airspace disease. Musculoskeletal: Small subcentimeter lucent lesion in the inferior endplate of T9 with sclerotic margins on axial images, favored to represent a small Schmorl's node. There are no other aggressive appearing lytic or blastic lesions noted in the visualized portions of the skeleton. CT ABDOMEN PELVIS FINDINGS Hepatobiliary: Several subcentimeter low-attenuation lesions are noted throughout the inferior aspect of the gallbladder, too small to characterize, but statistically likely to represent tiny cysts. No other larger more suspicious appearing hepatic lesions. No intra or extrahepatic biliary ductal dilatation. 1.0 x 0.5 cm calcification lying dependently in the gallbladder is compatible with a small calcified gallstone. Gallbladder is not distended. Gallbladder wall thickness appears normal. No pericholecystic fluid or surrounding inflammatory changes. Pancreas: There are several hypovascular appearing masses in the pancreas, largest of which are in the body (axial image 65 of  series 3 measuring 5.6 x 5.4 cm) and tail (axial image 58 of series 3 measuring 9.1 x 3.8 cm). A smaller lesion in the inferior aspect of the pancreatic head and uncinate process is also noted (axial image 78 of series 3) measuring 3.4 x 2.1 cm. No pancreatic ductal dilatation. No peripancreatic fluid inflammatory changes. Spleen: Unremarkable. Adrenals/Urinary Tract: Low-attenuation lesions in both kidneys compatible with simple cysts, largest of which measures 2.1 cm in the upper pole of the right kidney. Other subcentimeter low-attenuation lesions in both kidneys are too small to definitively characterize, but statistically likely to represent  tiny cysts. No hydroureteronephrosis. Urinary bladder is unremarkable in appearance. Nodularity throughout both adrenal glands, with the largest adrenal nodules measuring up to 2.0 cm on the right (axial image 58 of series 3) and 1.9 cm on the left (axial image 63 of series 3). Stomach/Bowel: The appearance of the stomach is normal. There is no pathologic dilatation of small bowel or colon. Areas of mural thickening and mucosal hyperenhancement are noted in the colon, most evident in the ascending colon where there is a somewhat mass-like appearance within infiltrative mural lesion that is irregular in shape and therefore difficult to discretely measure, but estimated to measure approximately 6.5 x 4.4 x 7.4 cm (axial image 93 of series 3 and coronal image 47 of series 5). There is extensive stranding in the adjacent pericolonic fat suggesting local infiltration of disease. Several prominent but nonenlarged ileocolic lymph nodes are noted. Mildly enlarged ileocolic lymph node measuring 1.4 cm in short axis (axial image 80 of series 3) also noted. Normal appendix. Vascular/Lymphatic: Aortic atherosclerosis with mild fusiform aneurysmal dilatation of the infrarenal abdominal aorta which measures up to 3.3 x 3.1 cm (axial image 85 of series 3). 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 Multiple prominent borderline enlarged and mildly enlarged retroperitoneal lymph nodes measuring up to 1.1 cm in short axis in the left para-aortic nodal station inferior to the left renal hilum (axial image 80 of series 3). Reproductive: Prostate gland and seminal vesicles are unremarkable in appearance. Other: Trace volume of ascites. Small amount of presacral edema. No pneumoperitoneum. No pneumoperitoneum. Musculoskeletal: There are no aggressive appearing lytic or blastic lesions noted in the visualized portions of the skeleton. IMPRESSION: 1. Infiltrative mass in the ascending colon  concerning for primary colonic neoplasm, estimated to measure approximately 6.5 x 4.7 x 7.4 cm, with evidence of local infiltration of disease in the pericolonic space adjacent to the lesion, as well as pathologically enlarged ileocolic and retroperitoneal lymphadenopathy, concerning for nodal metastasis. 2. Bilateral adrenal nodules which are indeterminate, but potentially metastatic. 3. Multiple hypovascular lesions scattered throughout the pancreas. The possibility of metastatic disease to the pancreas should be considered. Alternatively, benign etiologies such as chronic pancreatic pseudocysts could have a similar appearance (no recent prior studies are available for comparison). Close attention on follow-up studies is recommended to ensure the stability or regression of these pancreatic lesions. 4. Small to moderate left pleural effusion lying dependently with some passive subsegmental atelectasis in the left lower lobe. 5. Cardiomegaly with left atrial dilatation. 6. Aortic atherosclerosis, in addition to left main and three-vessel coronary artery disease. Please note that although the presence of coronary artery calcium documents the presence of coronary artery disease, the severity of this disease and any potential stenosis cannot be assessed on this non-gated CT examination. Assessment for potential risk factor modification, dietary therapy or pharmacologic therapy may be warranted,  if clinically indicated. 7. There are calcifications of the aortic valve. Echocardiographic correlation for evaluation of potential valvular dysfunction may be warranted if clinically indicated. 8. Additional incidental findings, as above. Electronically Signed   By: Vinnie Langton M.D.   On: 04/17/2022 05:34   DG Abdomen Acute W/Chest  Result Date: 05/08/2022 CLINICAL DATA:  Abdominal pain EXAM: DG ABDOMEN ACUTE WITH 1 VIEW CHEST COMPARISON:  Radiograph 04/21/2022 FINDINGS: Diffuse gaseous distension of small and large  bowel with some dilated small bowel loops noted. There is no evidence of free intraperitoneal gas. No abnormal calcifications. Cardiomegaly with unchanged pacemaker/AICD leads. There are bibasilar opacities, left greater than right. No large pleural effusion. No visible pneumothorax. There is no acute osseous abnormality. Degenerative changes of the spine. 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 IMPRESSION: Diffuse gaseous distension of small and large bowel with some dilated small bowel loops. Findings are compatible with ileus. Bibasilar airspace opacities, could be atelectasis or infection. Electronically Signed   By: Maurine Simmering M.D.   On: 05/08/2022 13:15   VAS [place] UPPER EXTREMITY VENOUS DUPLEX  Result Date: 04/21/2022 UPPER VENOUS STUDY  Patient Name:  Gregory [name]  Date of Exam:   04/21/2022 Medical Rec #: [id number]                  Accession #:    [id number] Date of Birth: [birth year]                  Patient Gender: M Patient Age:   23 years Exam Location:  [facility] Procedure:      VAS [place] UPPER EXTREMITY VENOUS DUPLEX Referring Phys: ANAND HONGALGI --------------------------------------------------------------------------------  Indications: swelling, suspecting IV infitration Comparison Study: no prior Performing Technologist: Archie Patten RVS  Examination Guidelines: A complete evaluation includes B-mode imaging, spectral Doppler, color Doppler, and power Doppler as needed of all accessible portions of each vessel. Bilateral testing is considered an integral part of a complete examination. Limited examinations for reoccurring indications may be performed as noted.  Right Findings: +----------+------------+---------+-----------+----------+-------+ RIGHT     CompressiblePhasicitySpontaneousPropertiesSummary +----------+------------+---------+-----------+----------+-------+ Subclavian    Full       Yes       Yes                       +----------+------------+---------+-----------+----------+-------+  Left Findings: +----------+------------+---------+-----------+----------+-----------------+ LEFT      CompressiblePhasicitySpontaneousProperties     Summary      +----------+------------+---------+-----------+----------+-----------------+ IJV           Full       Yes       Yes                                +----------+------------+---------+-----------+----------+-----------------+ Subclavian    Full       Yes       Yes                                +----------+------------+---------+-----------+----------+-----------------+ Axillary      Full       Yes       Yes                                +----------+------------+---------+-----------+----------+-----------------+ Brachial      Full  Yes       Yes                                +----------+------------+---------+-----------+----------+-----------------+ Radial        Full                                                    +----------+------------+---------+-----------+----------+-----------------+ Ulnar         Full                                                    +----------+------------+---------+-----------+----------+-----------------+ Cephalic      Full                                                    +----------+------------+---------+-----------+----------+-----------------+ Basilic       None                                  Age Indeterminate +----------+------------+---------+-----------+----------+-----------------+  Summary:  Right: No evidence of thrombosis in the subclavian.  Left: No evidence of deep vein thrombosis in the upper extremity. Findings consistent with age indeterminate superficial vein thrombosis involving the left basilic vein.  *See table(s) above for measurements and observations.  Diagnosing physician: Servando Snare MD Electronically signed by Servando Snare MD on 04/21/2022 at 7:50:44 PM.     Final     Labs:  CBC: Recent Labs    04/23/22 0322 05/05/22 1628 05/08/22 0918 05/09/22 0230  WBC 8.9 7.0 13.2* 17.9*  HGB 11.1* 12.7* 14.3 13.5  HCT 36.9* 41.6  43.4 48.5 46.1  PLT 355 599* 473* 438*    COAGS: Recent Labs    05/08/22 1637  INR 1.7*    BMP: Recent Labs    04/22/22 0209 04/23/22 0322 05/08/22 0918 05/09/22 0615  NA 137 137 135 130*  K 3.6 3.9 5.0 4.8  CL 105 106 104 100  CO2 26 27 16* 22  GLUCOSE 91 115* 115* 95  BUN 8 9 25* 25*  CALCIUM 8.2* 8.2* 8.3* 7.9*  CREATININE 0.87 0.78 1.14 0.96  GFRNONAA >60 >60 >60 >60    LIVER FUNCTION TESTS: Recent Labs    06/27/21 1526 02/28/22 1304 04/11/22 2107 04/21/22 0950  BILITOT 0.2 0.2 0.1* 1.0  AST 11 16 13* 28  ALT '14 8 12 '$ 53*  ALKPHOS 57 69 60 43  PROT 6.8 6.5 6.2* 5.7*  ALBUMIN 4.1 4.0 3.1* 2.3*    TUMOR MARKERS: No results for input(s): AFPTM, CEA, CA199, CHROMGRNA in the last 8760 hours.  Assessment and Plan: Intra-abdominal fluid collection  Patient s/p recent hand-assisted lap colectomy due to partially obstructive colonic mass.  He was recovering well at home and presented to ED yesterday morning after his pacemaker fired.  He complained on belly pain and CT AP showed large intra-abdominal fluid collection.  IR consulted for drainage.  Case reviewed by Dr. Dwaine Gale who approves  patient for procedure today.  He has been NPO.  He is agreeable to proceed.   Risks and benefits discussed with the patient including bleeding, infection, damage to adjacent structures, bowel perforation/fistula connection, and sepsis.  All of the patient's questions were answered, patient is agreeable to proceed. Consent signed and in chart.  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  CTA AP shows trace extravasation involving the splenic artery with focal dissection of the celiac artery.  Angiogram with embolization requested by surgery.   Dr. Dwaine Gale agreeable, timing dependent upon clinical status vs. Waiting out antiplatelet washout.  Patient currently stable.  Hgb 14.3  13.5.   Procedure was discussed in detail with patient who is agreeable to proceed and understands complexity of timing and procedure.   The Risks and benefits of embolization were discussed with the patient including, but not limited to bleeding, infection, vascular injury, post operative pain, or contrast induced renal failure.  This procedure involves the use of X-rays and because of the nature of the planned procedure, it is possible that we will have prolonged use of X-ray fluoroscopy.  Potential radiation risks to you include (but are not limited to) the following: - A slightly elevated risk for cancer several years later in life. This risk is typically less than 0.5% percent. This risk is low in comparison to the normal incidence of human cancer, which is 33% for women and 50% for men according to the [place]. - Radiation induced injury can include skin redness, resembling a rash, tissue breakdown / ulcers and hair loss (which can be temporary or permanent).   The likelihood of either of these occurring depends on the difficulty of the procedure and whether you are sensitive to radiation due to previous procedures, disease, or genetic conditions.   IF your procedure requires a prolonged use of radiation, you will be notified and given written instructions for further action.  It is your responsibility to monitor the irradiated area for the 2 weeks following the procedure and to notify your physician if you are concerned that you have suffered a radiation induced injury.    All of the patient's questions were answered, patient is agreeable to proceed. Consent signed and in chart.   Thank you for this interesting consult.  I greatly enjoyed meeting [facility] and look forward to participating in their care.  A copy of this  report was sent to the requesting provider on this date.  Electronically Signed: Docia Barrier, PA 05/09/2022, 1:16 PM   I spent a total of 40 Minutes    in face to face in clinical consultation, greater than 50% of which was counseling/coordinating care for intra-abdominal fluid collection, splenic artery pseudoaneurysm.

## 2022-05-09 NOTE — Sedation Documentation (Signed)
Pt transported to 6E15 with 2 RN's. Right groin assessed with primary RN, Naa, no bleeding or hematoma present. Bilateral lower extremity pulses assessed in room with Marylyn Ishihara, RN and all pulses are marked. Please see flow sheet for more information. All primary RN questions answered prior to this RN's departure.

## 2022-05-09 NOTE — Progress Notes (Cosign Needed)
Progress Note  Patient Name: Gregory [name] Date of Encounter: 05/09/2022  [facility] Cardiologist: Sanda Klein, MD   Subjective   No CP, palpitations, SOB, no symptoms with his VT, c/w some belly pain  Inpatient Medications    Scheduled Meds:  atorvastatin  80 mg Oral QPM   dapagliflozin propanediol  10 mg Oral Daily   ezetimibe  10 mg Oral Daily   insulin aspart  0-9 Units Subcutaneous TID WC   levETIRAcetam  500 mg Oral BID   pantoprazole  40 mg Oral Daily   sodium chloride flush  3 mL Intravenous Q12H   Continuous Infusions:  sodium chloride     amiodarone 30 mg/hr (05/09/22 0659)   piperacillin-tazobactam (ZOSYN)  IV 3.375 g (05/09/22 0557)   PRN Meds: sodium chloride, acetaminophen **OR** acetaminophen, morphine injection, oxyCODONE, sodium chloride flush   Vital Signs    Vitals:   05/08/22 2308 05/09/22 0021 05/09/22 0121 05/09/22 0443  BP:  107/79 (!) 119/94 111/85  Pulse: 92 93 92 90  Resp: _0 Temp:  97.6 F (36.4 C)  97.8 F (36.6 C)  TempSrc:  Axillary  Axillary  SpO2: 92% 90% 94% 95%  Weight:      Height:        Intake/Output Summary (Last 24 hours) at 05/09/2022 0902 Last data filed at 05/09/2022 0249 Gross per 24 hour  Intake --  Output 500 ml  Net -500 ml      05/08/2022    4:26 PM 05/08/2022    8:41 AM  Last 3 Weights  Weight (lbs) 225 lb 1.4 oz 211 lb  Weight (kg) 102.1 kg 95.709 kg      Telemetry    SR, he has had a few VTs treated successfully with ATP, no shocks - Personally Reviewed  ECG    SR 91bpm, 1st degree AVblock 261m - Personally Reviewed  Physical Exam   GEN: No acute distress.   Neck: No JVD Cardiac: RRR, no murmurs, rubs, or gallops.  Respiratory: CTA b/l. GI: not examined today MS: No edema; No deformity. Neuro:  Nonfocal  Psych: Normal affect   Labs    High Sensitivity Troponin:   Recent Labs  Lab 05/08/22 0918 05/08/22 1101  TROPONINIHS 32* 30*     Chemistry Recent  Labs  Lab 05/08/22 0918 05/09/22 0615  NA 135 130*  K 5.0 4.8  CL 104 100  CO2 16* 22  GLUCOSE 115* 95  BUN 25* 25*  CREATININE 1.14 0.96  CALCIUM 8.3* 7.9*  MG 2.6* 2.2  GFRNONAA >60 >60  ANIONGAP 15 8    Lipids No results for input(s): CHOL, TRIG, HDL, LABVLDL, LDLCALC, CHOLHDL in the last 168 hours.  Hematology Recent Labs  Lab 05/05/22 1628 05/08/22 0918 05/09/22 0230  WBC 7.0 13.2* 17.9*  RBC 5.12 5.63 5.57  HGB 12.7* 14.3 13.5  HCT 41.6  43.4 48.5 46.1  MCV 81.3 86.1 82.8  MCH 24.8* 25.4* 24.2*  MCHC 30.5 29.5* 29.3*  RDW 23.6* 23.9* 24.2*  PLT 599* 473* 438*   Thyroid  Recent Labs  Lab 05/08/22 0918  TSH 11.024*  FREET4 0.86    BNPNo results for input(s): BNP, PROBNP in the last 168 hours.  DDimer No results for input(s): DDIMER in the last 168 hours.   Radiology    CT ABDOMEN PELVIS W CONTRAST  Addendum Date: 05/08/2022   ADDENDUM REPORT: 05/08/2022 19:34 ADDENDUM: These results were called by telephone at the  time of interpretation on 05/08/2022 at 7:15 pm to provider Dr Georganna Skeans, who verbally acknowledged these results. Electronically Signed   By: Julian Hy M.D.   On: 05/08/2022 19:34   Result Date: 05/08/2022 CLINICAL DATA:  Abdominal pain, ileus, status post surgery EXAM: CT ABDOMEN AND PELVIS WITH CONTRAST TECHNIQUE: Multidetector CT imaging of the abdomen and pelvis was performed using the standard protocol following bolus administration of intravenous contrast. RADIATION DOSE REDUCTION: This exam was performed according to the departmental dose-optimization program which includes automated exposure control, adjustment of the mA and/or kV according to patient size and/or use of iterative reconstruction technique. CONTRAST:  159m OMNIPAQUE IOHEXOL 300 MG/ML  SOLN COMPARISON:  Abdominal radiograph dated 05/08/2022. CT abdomen/pelvis dated 04/16/2022. FINDINGS: Lower chest: Moderate left and small right pleural effusions. Bilateral lower lobe  atelectasis. Hepatobiliary: Subcentimeter hepatic cysts. Gallbladder is notable for a 6 mm layering gallstone (series 3/image 40). No associated inflammatory changes. No intrahepatic or extrahepatic duct dilatation. Pancreas: Pancreas is notable for peripancreatic fluid collections, including a dominant 4.9 cm collection along the pancreatic tail (series 3/image 28), previously 4.4 cm. Within this collection, there has been development of a 2.7 cm pseudoaneurysm (series 3/image 25), which is new from the prior. Spleen: Within normal limits. Adrenals/Urinary Tract: Multiple bilateral adrenal nodules, measuring up to 2.0 cm on the left (series 8/image 15), likely reflecting benign adrenal adenomas, unchanged. Multiple bilateral renal cysts, measuring up to 2.7 cm in the medial left upper pole (series 8/image 18), benign. No hydronephrosis. Bladder is within normal limits. Stomach/Bowel: Stomach is notable for a tiny hiatal hernia. Status post right hemicolectomy with appendectomy. No evidence of bowel obstruction. Vascular/Lymphatic: Infrarenal abdominal aorta measures up to 3.0 cm (series 3/image 56). Atherosclerotic calcifications of the abdominal aorta and branch vessels. No suspicious abdominopelvic lymphadenopathy. Reproductive: Prostate is unremarkable. Other: Large fluid in gas collection with enhancing rim in the abdomen/pelvis, measuring at least 14.8 x 28.2 x 20.6 cm, reflecting a postoperative abscess. Additional mild abdominopelvic ascites. No free air. Musculoskeletal: Mild degenerative changes of the visualized thoracolumbar spine. IMPRESSION: Large postoperative abscess in the abdomen/pelvis, measuring at least 14.8 x 28.2 x 20.6 cm. Consider surgical washout versus drainage by interventional radiology, as clinically warranted. 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 Interventional radiology consultation  is suggested for embolization. Additional ancillary findings as above. Aortic Atherosclerosis (ICD10-I70.0). Electronically Signed: By: SJulian HyM.D. On: 05/08/2022 19:09   DG Abdomen Acute W/Chest  Result Date: 05/08/2022 CLINICAL DATA:  Abdominal pain EXAM: DG ABDOMEN ACUTE WITH 1 VIEW CHEST COMPARISON:  Radiograph 04/21/2022 FINDINGS: Diffuse gaseous distension of small and large bowel with some dilated small bowel loops noted. There is no evidence of free intraperitoneal gas. No abnormal calcifications. Cardiomegaly with unchanged pacemaker/AICD leads. There are bibasilar opacities, left greater than right. No large pleural effusion. No visible pneumothorax. There is no acute osseous abnormality. Degenerative changes of the spine. 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 IMPRESSION: Diffuse gaseous distension of small and large bowel with some dilated small bowel loops. Findings are compatible with ileus. Bibasilar airspace opacities, could be atelectasis or infection. Electronically Signed   By: JMaurine SimmeringM.D.   On: 05/08/2022 13:15    Cardiac Studies   04/17/22: [facility] CONCLUSIONS: Widely patent left main Mid segment of LAD contains 40 to 50% narrowing.  The large first diagonal contains a patent stent in the mid body.  Proximal to the stent is a 50% stenosis. Circumflex is totally occluded beyond the origin of the large first obtuse marginal.  Left-to-right collaterals are noted.  The proximal circumflex before the origin of the first obtuse marginal contains 50 to 70% stenosis.  There is progression of this lesion compared to prior. Right coronary is totally occluded proximally.  It reconstitutes in the distal segment.  PDA is totally occluded.  PDA and left ventricular branches fill via left to right collaterals. Recommendations: Per managing team. Anatomy is similar to prior with the following exceptions: 50% to 70% stenosis proximal to the large  first obtuse marginal.  40 to 50% stenosis proximal to the stent segment in the first diagonal.     Echocardiogram 04/07/2022: Impressions:  1. Difficult acoustic windows Endocardium not well seen Overall there  does not appear to be a signfiicant change from echo in 2021.   2. LV function is depressed with hypokinesis of the lateral wall,  mid/distal inferior wall and apex; akinesis with aneurysmal dilitation of  inferior base. . Left ventricular ejection fraction, by estimation, is 35  to 40%. The left ventricle has  moderately decreased function. The left ventricular internal cavity size  was moderately dilated. There is mild left ventricular hypertrophy. Left  ventricular diastolic parameters are indeterminate.   3. Right ventricular systolic function is normal. The right ventricular  size is normal. There is mildly elevated pulmonary artery systolic  pressure.   4. Left atrial size was severely dilated.   5. Right atrial size was mildly dilated.   6. The mitral valve is normal in structure. Mild mitral valve  regurgitation.   7. The aortic valve is tricuspid. Aortic valve regurgitation is not  visualized. Aortic valve sclerosis/calcification is present, without any  evidence of aortic stenosis.   8. There is borderline dilatation of the aortic root, measuring 39 mm.  There is borderline dilatation of the ascending aorta, measuring 39 mm.   Patient Profile     61 y.o. male male with  history of CAD (known chronic occl of RCA with prior PCIs to LAD, diag, and LCx), VT, ICM,  OSA w/CPAP, seizures, chronic CHF, HLD, HTN, obesity, AFib, h/o VT, PVCs.   S/p R hemicolectomy 2/2 mass >> ca   Device information [employer] Dual Chamber ICD implanted 2005, 2010 and gen change for 2018    2017 to have slow VT prompting reduction of his VT zone rate to 150bpm.   Oct 2021, VT with appropriate therapies   He had stopped drinking (heavily at least).  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 CAD Stable by cath earlier this month No CP Neg HS Trops  3. Chronic CHF Has some edema, no SOB Meds held with some lower BPs yesterday on admission Follow closely  4. Paroxysmal Afib SR here Eliquis held for possible need of intervention/surgery  4. Acute abdominal process S/p R hemicolectomy 04/18/22 Colon cancer leukocytosis Ileus Abscess ?pseudoaneurysm splenic artery    For questions or updates, please contact [place] Please consult [email or web address] for  contact info under        Signed, Baldwin [place], PA-C  05/09/2022, 9:02 AM    EP Attending  Patient seen and examined. Agree with above. The patient is stable s/p drain placement of an intra-abdominal drain for abscess. He has had some asymptomatic VT. He will continue IV amiodarone. Continue anti-biotics of abdominal abscess. We dc IV amio once his VT is quiet.  Carleene Overlie Blaine Guiffre,MD

## 2022-05-09 NOTE — Progress Notes (Signed)
Progress Note     Subjective: Pt reports generalized abdominal pain and that he is hungry. He is not passing flatus and has not had a BM.   Objective: Vital signs in last 24 hours: Temp:  [97.6 F (36.4 C)-98.1 F (36.7 C)] 97.8 F (36.6 C) (05/26 0443) Pulse Rate:  [90-95] 90 (05/26 0443) Resp:  [14-29] 16 (05/26 0443) BP: (92-119)/(64-94) 111/85 (05/26 0443) SpO2:  [90 %-95 %] 95 % (05/26 0443) Weight:  [102.1 kg] 102.1 kg (05/25 1626) Last BM Date : 05/06/22  Intake/Output from previous day: 05/25 0701 - 05/26 0700 In: -  Out: 500 [Urine:500] Intake/Output this shift: No intake/output data recorded.  PE: Gen:  Alert, NAD, pleasant Abd: distended but soft, generalized ttp without peritonitis, +BS, incisions cdi   Lab Results:  Recent Labs    05/08/22 0918 05/09/22 0230  WBC 13.2* 17.9*  HGB 14.3 13.5  HCT 48.5 46.1  PLT 473* 438*   BMET Recent Labs    05/08/22 0918 05/09/22 0615  NA 135 130*  K 5.0 4.8  CL 104 100  CO2 16* 22  GLUCOSE 115* 95  BUN 25* 25*  CREATININE 1.14 0.96  CALCIUM 8.3* 7.9*   PT/INR Recent Labs    05/08/22 1637  LABPROT 19.9*  INR 1.7*   CMP     Component Value Date/Time   NA 130 (L) 05/09/2022 0615   NA 137 04/11/2022 1219   K 4.8 05/09/2022 0615   CL 100 05/09/2022 0615   CO2 22 05/09/2022 0615   GLUCOSE 95 05/09/2022 0615   BUN 25 (H) 05/09/2022 0615   BUN 13 04/11/2022 1219   CREATININE 0.96 05/09/2022 0615   CREATININE 1.08 12/12/2016 1445   CALCIUM 7.9 (L) 05/09/2022 0615   PROT 5.7 (L) 04/21/2022 0950   PROT 6.5 02/28/2022 1304   ALBUMIN 2.3 (L) 04/21/2022 0950   ALBUMIN 4.0 02/28/2022 1304   AST 28 04/21/2022 0950   ALT 53 (H) 04/21/2022 0950   ALKPHOS 43 04/21/2022 0950   BILITOT 1.0 04/21/2022 0950   BILITOT 0.2 02/28/2022 1304   GFRNONAA >60 05/09/2022 0615   GFRAA 91 11/15/2020 0959   Lipase     Component Value Date/Time   LIPASE 120 (H) 09/09/2020 1810       Studies/Results: CT  ANGIO ABDOMEN W &/OR WO CONTRAST  Result Date: 05/09/2022 CLINICAL DATA:  Splenic artery dissection. EXAM: CT ANGIOGRAPHY ABDOMEN TECHNIQUE: Multidetector CT imaging of the abdomen was performed using the standard protocol during bolus administration of intravenous contrast. Multiplanar reconstructed images and MIPs were obtained and reviewed to evaluate the vascular anatomy. RADIATION DOSE REDUCTION: This exam was performed according to the departmental dose-optimization program which includes automated exposure control, adjustment of the mA and/or kV according to patient size and/or use of iterative reconstruction technique. CONTRAST:  36m OMNIPAQUE IOHEXOL 350 MG/ML SOLN COMPARISON:  CT of May 08, 2022. FINDINGS: VASCULAR Aorta: Atherosclerosis of abdominal aorta is noted without dissection. 3 cm fusiform infrarenal abdominal aortic aneurysm is noted. 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 No thrombus is noted. 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 The common hepatic, left gastric and splenic arteries appear to be patent. There does appear to be some extravasation of contrast arising from a distal branch of the splenic artery in the region of the splenic hilum, best seen on image number 33 of series 5.  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 SMA: Patent without evidence of aneurysm, dissection, vasculitis or significant stenosis. Renals: Both renal arteries are patent without evidence of aneurysm, dissection, vasculitis, fibromuscular dysplasia or significant stenosis. IMA: Patent without evidence of aneurysm, dissection, vasculitis or significant stenosis. Inflow: Patent without evidence of aneurysm, dissection, vasculitis or significant stenosis. Veins: No obvious venous abnormality within the limitations of this arterial phase study. Review of the MIP images  confirms the above findings. NON-VASCULAR Lower chest: Bilateral pleural effusions are noted with adjacent subsegmental atelectasis. Hepatobiliary: Gallstone is noted. Dilated gallbladder is noted. No biliary dilatation is noted. Liver is otherwise unremarkable. Pancreas: There is again noted peripancreatic fluid collections including 10.6 x 4.4 cm bilobed abnormality which appears to contain a pseudoaneurysm as described above. Spleen: Normal in size. No definite splenic parenchymal abnormality is noted. Adrenals/Urinary Tract: Bilateral adrenal nodules are again noted. No hydronephrosis or renal obstruction is noted. Stomach/Bowel: Stomach is unremarkable. No definite evidence of bowel obstruction is noted. Lymphatic: No definite adenopathy is noted. Other: There is again noted a large complex fluid collection involving most of the right-sided the abdomen that measures 25 x 15 cm. Multiple air-fluid levels are noted within it suggesting internal loculations. Musculoskeletal: No acute or significant osseous findings. 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 There is also noted small amount of contrast extravasation involving a distal branch of the splenic artery which is seen supplying pseudoaneurysm in the splenic hilum, suggesting arterial injury. These results will be called to the ordering clinician or representative by the Radiologist Assistant, and communication documented in the PACS or zVision Dashboard. 3 cm infrarenal abdominal aortic aneurysm is noted. Recommend follow-up ultrasound every 3 years. This recommendation follows ACR consensus guidelines: White Paper of the ACR Incidental Findings Committee II on Vascular Findings. J Am Coll Radiol 2013; 10:789-794. NON-VASCULAR Continued presence of large complex fluid collection in the right-sided the abdomen measuring 25 x 15 cm. This is concerning for abscess.  Multiple air-fluid levels are noted within it suggesting internal loculations. Continued presence of peripancreatic fluid collections including 10.6 x 4.4 cm bilobed abnormality around the pancreatic tail which contains pseudoaneurysm described above. Bilateral adrenal nodules are again noted. Bilateral pleural effusions are again noted with associated atelectasis. Aortic Atherosclerosis (ICD10-I70.0). Electronically Signed   By: Marijo Conception M.D.   On: 05/09/2022 09:52   CT ABDOMEN PELVIS W CONTRAST  Addendum Date: 05/08/2022   ADDENDUM REPORT: 05/08/2022 19:34 ADDENDUM: These results were called by telephone at the time of interpretation on 05/08/2022 at 7:15 pm to provider Dr Georganna Skeans, who verbally acknowledged these results. Electronically Signed   By: Julian Hy M.D.   On: 05/08/2022 19:34   Result Date: 05/08/2022 CLINICAL DATA:  Abdominal pain, ileus, status post surgery EXAM: CT ABDOMEN AND PELVIS WITH CONTRAST TECHNIQUE: Multidetector CT imaging of the abdomen and pelvis was performed using the standard protocol following bolus administration of intravenous contrast. RADIATION DOSE REDUCTION: This exam was performed according to the departmental dose-optimization program which includes automated exposure control, adjustment of the mA and/or kV according to patient size and/or use of iterative reconstruction technique. CONTRAST:  183m OMNIPAQUE IOHEXOL 300 MG/ML  SOLN COMPARISON:  Abdominal radiograph dated 05/08/2022. CT abdomen/pelvis dated 04/16/2022. FINDINGS: Lower chest: Moderate left and small right pleural effusions. Bilateral lower lobe atelectasis. Hepatobiliary: Subcentimeter hepatic cysts. Gallbladder is notable  for a 6 mm layering gallstone (series 3/image 40). No associated inflammatory changes. No intrahepatic or extrahepatic duct dilatation. Pancreas: Pancreas is notable for peripancreatic fluid collections, including a dominant 4.9 cm collection along the pancreatic  tail (series 3/image 28), previously 4.4 cm. Within this collection, there has been development of a 2.7 cm pseudoaneurysm (series 3/image 25), which is new from the prior. Spleen: Within normal limits. Adrenals/Urinary Tract: Multiple bilateral adrenal nodules, measuring up to 2.0 cm on the left (series 8/image 15), likely reflecting benign adrenal adenomas, unchanged. Multiple bilateral renal cysts, measuring up to 2.7 cm in the medial left upper pole (series 8/image 18), benign. No hydronephrosis. Bladder is within normal limits. Stomach/Bowel: Stomach is notable for a tiny hiatal hernia. Status post right hemicolectomy with appendectomy. No evidence of bowel obstruction. Vascular/Lymphatic: Infrarenal abdominal aorta measures up to 3.0 cm (series 3/image 56). Atherosclerotic calcifications of the abdominal aorta and branch vessels. No suspicious abdominopelvic lymphadenopathy. Reproductive: Prostate is unremarkable. Other: Large fluid in gas collection with enhancing rim in the abdomen/pelvis, measuring at least 14.8 x 28.2 x 20.6 cm, reflecting a postoperative abscess. Additional mild abdominopelvic ascites. No free air. Musculoskeletal: Mild degenerative changes of the visualized thoracolumbar spine. IMPRESSION: Large postoperative abscess in the abdomen/pelvis, measuring at least 14.8 x 28.2 x 20.6 cm. Consider surgical washout versus drainage by interventional radiology, as clinically warranted. 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 Interventional radiology consultation is suggested for embolization. Additional ancillary findings as above. Aortic Atherosclerosis (ICD10-I70.0). Electronically Signed: By: Julian Hy M.D. On: 05/08/2022 19:09   DG Abdomen Acute W/Chest  Result Date: 05/08/2022 CLINICAL DATA:  Abdominal pain EXAM: DG ABDOMEN ACUTE WITH 1 VIEW CHEST COMPARISON:  Radiograph 04/21/2022  FINDINGS: Diffuse gaseous distension of small and large bowel with some dilated small bowel loops noted. There is no evidence of free intraperitoneal gas. No abnormal calcifications. Cardiomegaly with unchanged pacemaker/AICD leads. There are bibasilar opacities, left greater than right. No large pleural effusion. No visible pneumothorax. There is no acute osseous abnormality. Degenerative changes of the spine. 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 IMPRESSION: Diffuse gaseous distension of small and large bowel with some dilated small bowel loops. Findings are compatible with ileus. Bibasilar airspace opacities, could be atelectasis or infection. Electronically Signed   By: Maurine Simmering M.D.   On: 05/08/2022 13:15    Anti-infectives: Anti-infectives (From admission, onward)    Start     Dose/Rate Route Frequency Ordered Stop   05/08/22 2200  piperacillin-tazobactam (ZOSYN) IVPB 3.375 g        3.375 g 12.5 mL/hr over 240 Minutes Intravenous Every 8 hours 05/08/22 2008          Assessment/Plan POD#21, s/p lap assisted R colectomy for adenocarcinoma, Dr. 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 Has seen Dr. 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

## 2022-05-09 NOTE — Procedures (Signed)
Interventional Radiology Procedure Note  Procedure:  Abdominal abscess Splenic pseudoaneurysm  Indication:  14 fr drain placed in abdominal abscess.  2700 mL of purulent material removed.  Samples sent for gram stain and culture. 2.   Selective coil embolization of splenic artery branches (x2) for treatment of pseudoaneurysm.  Findings: Please refer to procedural dictation for full description.  Complications: None  EBL: < 10 mL  Miachel Roux, MD [phone]

## 2022-05-09 NOTE — Sedation Documentation (Addendum)
While attempting to flush the line with the 0.5mg of versed, given at 15:45, IV met with resistance and then pain. IV site is under sterile drape. Dr. Mir notified who stated that no additional medications should be needed. Pt still has IV to the left arm with amiodarone infusing.  

## 2022-05-09 NOTE — Progress Notes (Incomplete)
Pt had

## 2022-05-09 NOTE — Progress Notes (Signed)
Pt had runs of VTach lasted for 1 min.( 0115 and 0141) . Pt denies chest pain, denies nausea/vomiting and not in respiratory distress. 12 L EKG done showing NSR with 1st degree HB. Dr. Hal Hope [facility] on call) and Dr. Marcelle Smiling ( cardiology on call) were notified. Dr. Marcelle Smiling called and ordered to continue Amiodarone drip and will consult EP in am. Will continue to monitor pt.

## 2022-05-09 NOTE — Progress Notes (Addendum)
PROGRESS NOTE    Gregory [name]  IZT:[id number] DOB: 1961-05-26 DOA: 05/08/2022 PCP: Patient, [name]   Brief Narrative:  61 years old male with PMH significant for CAD s/p stenting, chronic systolic heart failure, recurrent VT with ICD placement, atrial fibrillation, diabetes mellitus type 2, history of seizures, hypertension presented in the ED after his AICD gave him a shock.  He felt his heart rate was elevated and then he felt like a shock so called 911.  Patient denies any chest pain or shortness of breath.  He does have history of recurrent VTE.  Patient had recent abdominal surgery , states abdominal pain has not improved.  Patient denies any discharge from his incision. He was recently hospitalized from 4/28 - 5/12 for symptomatic anemia. 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  Metastatic involving lymph nodes.  Patient underwent hand-assisted laparoscopic right colectomy on 04/18/2022 so far has received 7 units of PRBCs.  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  IR is consulted.   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  Active Problems:   Ileus following gastrointestinal surgery (HCC)   Hypothyroidism   Leukocytosis   Cancer of right colon (HCC)   Chronic combined systolic and diastolic CHF (congestive heart failure) (HCC)/ischemic cardiomyopathy   CAD S/P percutaneous coronary angioplasty   Paroxysmal atrial fibrillation (HCC)   Controlled type 2 diabetes mellitus without complication, without long-term current use of insulin (HCC)   Essential hypertension   Seizures (HCC)   OSA on CPAP   Recurrent VT/   s/p AICD shock: Patient with history of recurrent VT history reports AICD presented after AICD firing. Patient denies any chest pain.  EKG shows normal sinus rhythm. EP physician consulted.  Patient is started on amiodarone gtt. -TSH abnormal -f/u on EP recommendations    Ileus status post gastric surgery Intra-abdominal abscess. S/p hand-assisted laparoscopic right colectomy on 04/18/22. Had post operative ileus during last hospital stay. Now presenting with abdominal pain, last BM 2 days ago and xray finding of ileus General surgery has been consulted. 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 Holding eliquis/plavix until IR intervention. NPO except for ice chips/meds  Adequate pain control with oxycodone and morphine as needed for breakthrough pain General surgery discussed the case with IR for evaluation. Patient need ultrasound-guided drainage of abscess and possible embolization of the splenic artery pseudoaneurysm. Awaiting antiplatelet washout.  Continue IV Zosyn for abscess.  Leukocytosis: Likely secondary to intra-abdominal abscess. Lactic acid 1.7. CT abdomen shows intra-abdominal abscess.  Started on IV Zosyn.   Hypothyroidism Could be sick thyroid or due to amiodarone. Free T4 wnl Would repeat levels in 1-2 weeks.   Cancer of right colon [facility]) Recently diagnosed at previous hospitalization. 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  He has followed up with oncology where PET scan is planned and discussed treatment options.   Chronic combined systolic and diastolic CHF: Echo 3/82/5053: LVEF 35-40%, worse compared to 09/22/2020 (LVEF 40-45%).  Clinically euvolemic.  Continue farxiga Metoprolol XL and Entresto is on hold with soft bp/hypotension    Paroxysmal atrial  fibrillation [facility]) Patient remains in normal sinus rhythm now. Continue amiodarone gtt. Eliquis is on hold due to intervention.  CAD S/P percutaneous coronary angioplasty S/p LHC on 04/17/22 recommendations:  Anatomy is similar to prior with the following exceptions: 50% to 70% stenosis proximal to the large first obtuse marginal.  40 to 50% stenosis proximal to the stent segment in the first diagonal. He denies any chest pain, troponin flat but mildly elevated. Continue  medical management. Holding plavix until IR procedure completed.   Type 2 diabetes without complication. Obtain hemoglobin A1c. Appears to be well controlled on diet alone. Continue farxiga for CHF Start SSI and accuchecks per protocol. Sensitive while NPO     Essential hypertension Soft bp, systolic <973.  Has not had any medication in 1-2 days Hold entrestro, toprol-xl and lasix    Seizures (HCC) Continue keppra Seizure precautions    OSA on CPAP Uses cpap at night  Continue qhs    DVT prophylaxis: TED hose Code Status: Full code. Family Communication:No family at bed side. Disposition Plan:    Status is: Inpatient Remains inpatient appropriate because: Admitted for AICD shock found to have intra-abdominal abscess and pseudoaneurysm of splenic artery patient is I have guided drainage, embolization of the splenic artery    Consultants:  General surgery, IR,  Procedures: CT abdomen pelvis Antimicrobials:  Anti-infectives (From admission, onward)    Start     Dose/Rate Route Frequency Ordered Stop   05/08/22 2200  piperacillin-tazobactam (ZOSYN) IVPB 3.375 g        3.375 g 12.5 mL/hr over 240 Minutes Intravenous Every 8 hours 05/08/22 2008         Subjective: Patient was seen and examined at bedside.  Overnight events noted.   Patient reports having significant pain in the lower abdomen.  He denies any nausea and vomiting.   He is found to have intra-abdominal abscess on CT  abdomen.  Objective: Vitals:   05/09/22 1255 05/09/22 1445 05/09/22 1450 05/09/22 1455  BP: 114/70 112/89 114/87 112/86  Pulse: 84 87 86 88  Resp: '16 20 20 19  '$ Temp: (!) 97.5 F (36.4 C)     TempSrc: Oral     SpO2: 96% 99% 99% 96%  Weight:      Height:        Intake/Output Summary (Last 24 hours) at 05/09/2022 1457 Last data filed at 05/09/2022 0249 Gross per 24 hour  Intake --  Output 500 ml  Net -500 ml   Filed Weights   05/08/22 0841 05/08/22 1626  Weight: 95.7 kg 102.1 kg    Examination:  General exam: Appears comfortable, deconditioned, chronically ill looking. Respiratory system: CTA chest bilaterally, no wheezing, no crackles, normal respiratory effort. Cardiovascular system: S1 & S2 heard, regular rate and rhythm, no murmur. Gastrointestinal system: Abdomen is soft, distended, mildly tender, normal bowel sounds Central nervous system: Alert and oriented X 3 . No focal neurological deficits. Extremities: Edema+,, no cyanosis, no clubbing. Skin: No rashes, lesions or ulcers Psychiatry: Judgement and insight appear normal. Mood & affect appropriate.     Data Reviewed: I have personally reviewed following labs and imaging studies  CBC: Recent Labs  Lab 05/05/22 1628 05/08/22 0918 05/09/22 0230  WBC 7.0 13.2* 17.9*  NEUTROABS 5.4 11.8*  --   HGB 12.7* 14.3 13.5  HCT 41.6  43.4 48.5 46.1  MCV 81.3 86.1 82.8  PLT 599* 473* 532*   Basic Metabolic Panel: Recent Labs  Lab 05/08/22 0918 05/09/22 0615  NA 135 130*  K 5.0 4.8  CL 104 100  CO2 16* 22  GLUCOSE 115* 95  BUN 25* 25*  CREATININE 1.14 0.96  CALCIUM 8.3* 7.9*  MG 2.6* 2.2   GFR: Estimated Creatinine Clearance: 101.8 mL/min (by C-G formula based on SCr of 0.96 mg/dL). Liver Function Tests: No results for input(s): AST, ALT, ALKPHOS, BILITOT, PROT, ALBUMIN in the last 168 hours. No results for input(s): LIPASE, AMYLASE in the last 168 hours. No results for input(s): AMMONIA in the last 168  hours. Coagulation Profile: Recent Labs  Lab 05/08/22 1637 05/09/22 1325  INR 1.7* 1.6*   Cardiac Enzymes: No results for input(s): CKTOTAL, CKMB, CKMBINDEX, TROPONINI in the last 168 hours. BNP (last 3 results) No results for input(s): PROBNP in the last 8760 hours. HbA1C: Recent Labs    05/08/22 1637  HGBA1C 5.1   CBG: Recent Labs  Lab 05/08/22 1730 05/08/22 2158 05/09/22 1014 05/09/22 1117  GLUCAP 102* 102* 93 96   Lipid Profile: No results for input(s): CHOL, HDL, LDLCALC, TRIG, CHOLHDL, LDLDIRECT in the last 72 hours. Thyroid Function Tests: Recent Labs    05/08/22 0918  TSH 11.024*  FREET4 0.86   Anemia Panel: No results for input(s): VITAMINB12, FOLATE, FERRITIN, TIBC, IRON, RETICCTPCT in the last 72 hours. Sepsis Labs: Recent Labs  Lab 05/08/22 1637 05/08/22 2017  LATICACIDVEN 1.9 1.3    No results found for this or any previous visit (from the past 240 hour(s)).   Radiology Studies: CT ANGIO ABDOMEN W &/OR WO CONTRAST  Result Date: 05/09/2022 CLINICAL DATA:  Splenic artery dissection. EXAM: CT ANGIOGRAPHY ABDOMEN TECHNIQUE: Multidetector CT imaging of the abdomen was performed using the standard protocol during bolus administration of intravenous contrast. Multiplanar reconstructed images and MIPs were obtained and reviewed to evaluate the vascular anatomy. RADIATION DOSE REDUCTION: This exam was performed according to the departmental dose-optimization program which includes automated exposure control, adjustment of the mA and/or kV according to patient size and/or use of iterative reconstruction technique. CONTRAST:  30m OMNIPAQUE IOHEXOL 350 MG/ML SOLN COMPARISON:  CT of May 08, 2022. FINDINGS: VASCULAR Aorta: Atherosclerosis of abdominal aorta is noted without dissection. 3 cm fusiform infrarenal abdominal aortic aneurysm is noted. 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 No thrombus is noted. 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 The common hepatic, left gastric and splenic arteries appear to be patent. There does appear to be some extravasation of contrast arising from a distal branch of the splenic artery in the region of the splenic hilum, best seen on image number 33 of series 5. 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 SMA: Patent without evidence of aneurysm, dissection, vasculitis or significant stenosis. Renals: Both renal arteries are patent without evidence of aneurysm, dissection, vasculitis, fibromuscular dysplasia or significant stenosis. IMA: Patent without evidence of aneurysm, dissection, vasculitis or significant stenosis. Inflow: Patent without evidence of aneurysm, dissection, vasculitis or significant stenosis. Veins: No obvious venous abnormality within the limitations of this arterial phase study. Review of the MIP images confirms the above findings. NON-VASCULAR Lower chest: Bilateral pleural effusions are noted with adjacent subsegmental atelectasis. Hepatobiliary: Gallstone is noted. Dilated gallbladder is noted. No biliary dilatation is noted. Liver is otherwise unremarkable. Pancreas: There is again noted peripancreatic fluid collections including 10.6 x 4.4 cm bilobed abnormality which appears to contain a pseudoaneurysm as  described above. Spleen: Normal in size. No definite splenic parenchymal abnormality is noted. Adrenals/Urinary Tract: Bilateral adrenal nodules are again noted. No hydronephrosis or renal obstruction is noted. Stomach/Bowel: Stomach is unremarkable. No definite evidence of bowel obstruction is noted. Lymphatic: No definite adenopathy is noted. Other: There is again noted a large complex fluid collection involving most of the right-sided the abdomen that measures 25 x 15 cm. Multiple air-fluid levels are noted within it suggesting internal loculations. Musculoskeletal:  No acute or significant osseous findings. 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 There is also noted small amount of contrast extravasation involving a distal branch of the splenic artery which is seen supplying pseudoaneurysm in the splenic hilum, suggesting arterial injury. These results will be called to the ordering clinician or representative by the Radiologist Assistant, and communication documented in the PACS or zVision Dashboard. 3 cm infrarenal abdominal aortic aneurysm is noted. Recommend follow-up ultrasound every 3 years. This recommendation follows ACR consensus guidelines: White Paper of the ACR Incidental Findings Committee II on Vascular Findings. J Am Coll Radiol 2013; 10:789-794. NON-VASCULAR Continued presence of large complex fluid collection in the right-sided the abdomen measuring 25 x 15 cm. This is concerning for abscess. Multiple air-fluid levels are noted within it suggesting internal loculations. Continued presence of peripancreatic fluid collections including 10.6 x 4.4 cm bilobed abnormality around the pancreatic tail which contains pseudoaneurysm described above. Bilateral adrenal nodules are again noted. Bilateral pleural effusions are again noted with associated atelectasis. Aortic Atherosclerosis (ICD10-I70.0). Electronically Signed   By: Marijo Conception M.D.   On: 05/09/2022 09:52   CT ABDOMEN PELVIS W CONTRAST  Addendum Date: 05/08/2022   ADDENDUM REPORT: 05/08/2022 19:34 ADDENDUM: These results were called by telephone at the time of interpretation on 05/08/2022 at 7:15 pm to provider Dr Georganna Skeans, who verbally acknowledged these results. Electronically Signed   By: Julian Hy M.D.   On: 05/08/2022 19:34   Result Date: 05/08/2022 CLINICAL DATA:  Abdominal pain, ileus, status post surgery EXAM: CT ABDOMEN AND PELVIS WITH CONTRAST TECHNIQUE: Multidetector CT imaging of  the abdomen and pelvis was performed using the standard protocol following bolus administration of intravenous contrast. RADIATION DOSE REDUCTION: This exam was performed according to the departmental dose-optimization program which includes automated exposure control, adjustment of the mA and/or kV according to patient size and/or use of iterative reconstruction technique. CONTRAST:  171m OMNIPAQUE IOHEXOL 300 MG/ML  SOLN COMPARISON:  Abdominal radiograph dated 05/08/2022. CT abdomen/pelvis dated 04/16/2022. FINDINGS: Lower chest: Moderate left and small right pleural effusions. Bilateral lower lobe atelectasis. Hepatobiliary: Subcentimeter hepatic cysts. Gallbladder is notable for a 6 mm layering gallstone (series 3/image 40). No associated inflammatory changes. No intrahepatic or extrahepatic duct dilatation. Pancreas: Pancreas is notable for peripancreatic fluid collections, including a dominant 4.9 cm collection along the pancreatic tail (series 3/image 28), previously 4.4 cm. Within this collection, there has been development of a 2.7 cm pseudoaneurysm (series 3/image 25), which is new from the prior. Spleen: Within normal limits. Adrenals/Urinary Tract: Multiple bilateral adrenal nodules, measuring up to 2.0 cm on the left (series 8/image 15), likely reflecting benign adrenal adenomas, unchanged. Multiple bilateral renal cysts, measuring up to 2.7 cm in the medial left upper pole (series 8/image 18), benign. No hydronephrosis. Bladder is within normal limits. Stomach/Bowel: Stomach is notable for a tiny hiatal hernia. Status post right hemicolectomy with appendectomy. No evidence of bowel obstruction. Vascular/Lymphatic:  Infrarenal abdominal aorta measures up to 3.0 cm (series 3/image 56). Atherosclerotic calcifications of the abdominal aorta and branch vessels. No suspicious abdominopelvic lymphadenopathy. Reproductive: Prostate is unremarkable. Other: Large fluid in gas collection with enhancing rim in the  abdomen/pelvis, measuring at least 14.8 x 28.2 x 20.6 cm, reflecting a postoperative abscess. Additional mild abdominopelvic ascites. No free air. Musculoskeletal: Mild degenerative changes of the visualized thoracolumbar spine. IMPRESSION: Large postoperative abscess in the abdomen/pelvis, measuring at least 14.8 x 28.2 x 20.6 cm. Consider surgical washout versus drainage by interventional radiology, as clinically warranted. 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 Interventional radiology consultation is suggested for embolization. Additional ancillary findings as above. Aortic Atherosclerosis (ICD10-I70.0). Electronically Signed: By: Julian Hy M.D. On: 05/08/2022 19:09   DG Abdomen Acute W/Chest  Result Date: 05/08/2022 CLINICAL DATA:  Abdominal pain EXAM: DG ABDOMEN ACUTE WITH 1 VIEW CHEST COMPARISON:  Radiograph 04/21/2022 FINDINGS: Diffuse gaseous distension of small and large bowel with some dilated small bowel loops noted. There is no evidence of free intraperitoneal gas. No abnormal calcifications. Cardiomegaly with unchanged pacemaker/AICD leads. There are bibasilar opacities, left greater than right. No large pleural effusion. No visible pneumothorax. There is no acute osseous abnormality. Degenerative changes of the spine. 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 IMPRESSION: Diffuse gaseous distension of small and large bowel with some dilated small bowel loops. Findings are compatible with ileus. Bibasilar airspace opacities, could be atelectasis or infection. Electronically Signed   By: Maurine Simmering M.D.   On: 05/08/2022 13:15    Scheduled Meds:  atorvastatin  80 mg Oral QPM   dapagliflozin propanediol  10 mg Oral Daily   ezetimibe  10 mg Oral Daily   insulin aspart  0-9 Units Subcutaneous TID WC   levETIRAcetam  500 mg Oral BID   lidocaine        pantoprazole  40 mg Oral Daily   sodium chloride flush  3 mL Intravenous Q12H   Continuous Infusions:  sodium chloride     amiodarone 30 mg/hr (05/09/22 0659)   piperacillin-tazobactam (ZOSYN)  IV 3.375 g (05/09/22 0557)     LOS: 1 day    Time spent: 50 mins    Bard Haupert, MD [facility]   If 7PM-7AM, please contact night-coverage

## 2022-05-10 DIAGNOSIS — I472 Ventricular tachycardia, unspecified: Secondary | ICD-10-CM | POA: Diagnosis not present

## 2022-05-10 LAB — CBC
HCT: 39.8 % (ref 39.0–52.0)
Hemoglobin: 12.1 g/dL — ABNORMAL LOW (ref 13.0–17.0)
MCH: 25.1 pg — ABNORMAL LOW (ref 26.0–34.0)
MCHC: 30.4 g/dL (ref 30.0–36.0)
MCV: 82.4 fL (ref 80.0–100.0)
Platelets: 407 10*3/uL — ABNORMAL HIGH (ref 150–400)
RBC: 4.83 MIL/uL (ref 4.22–5.81)
RDW: 24 % — ABNORMAL HIGH (ref 11.5–15.5)
WBC: 28.8 10*3/uL — ABNORMAL HIGH (ref 4.0–10.5)
nRBC: 0 % (ref 0.0–0.2)

## 2022-05-10 LAB — MAGNESIUM: Magnesium: 2.1 mg/dL (ref 1.7–2.4)

## 2022-05-10 LAB — COMPREHENSIVE METABOLIC PANEL
ALT: 43 U/L (ref 0–44)
AST: 56 U/L — ABNORMAL HIGH (ref 15–41)
Albumin: 1.6 g/dL — ABNORMAL LOW (ref 3.5–5.0)
Alkaline Phosphatase: 114 U/L (ref 38–126)
Anion gap: 11 (ref 5–15)
BUN: 22 mg/dL (ref 8–23)
CO2: 19 mmol/L — ABNORMAL LOW (ref 22–32)
Calcium: 7.8 mg/dL — ABNORMAL LOW (ref 8.9–10.3)
Chloride: 98 mmol/L (ref 98–111)
Creatinine, Ser: 0.86 mg/dL (ref 0.61–1.24)
GFR, Estimated: >60 mL/min (ref >60)
Glucose, Bld: 119 mg/dL — ABNORMAL HIGH (ref 70–99)
Potassium: 5.2 mmol/L — ABNORMAL HIGH (ref 3.5–5.1)
Sodium: 128 mmol/L — ABNORMAL LOW (ref 135–145)
Total Bilirubin: 0.7 mg/dL (ref 0.3–1.2)
Total Protein: 5.7 g/dL — ABNORMAL LOW (ref 6.5–8.1)

## 2022-05-10 LAB — GLUCOSE, CAPILLARY
Glucose-Capillary: 116 mg/dL — ABNORMAL HIGH (ref 70–99)
Glucose-Capillary: 177 mg/dL — ABNORMAL HIGH (ref 70–99)
Glucose-Capillary: 147 mg/dL — ABNORMAL HIGH (ref 70–99)
Glucose-Capillary: 106 mg/dL — ABNORMAL HIGH (ref 70–99)

## 2022-05-10 LAB — POTASSIUM: Potassium: 5.1 mmol/L (ref 3.5–5.1)

## 2022-05-10 LAB — PHOSPHORUS: Phosphorus: 4.3 mg/dL (ref 2.5–4.6)

## 2022-05-10 LAB — PATHOLOGIST SMEAR REVIEW

## 2022-05-10 MED ORDER — OXYCODONE HCL 5 MG PO TABS
5.0000 mg | ORAL_TABLET | Freq: Four times a day (QID) | ORAL | Status: DC | PRN
  Administered 2022-05-10 – 2022-05-18 (×27): 10 mg via ORAL
  Administered 2022-05-19: 5 mg via ORAL
  Administered 2022-05-19 (×2): 10 mg via ORAL
  Administered 2022-05-19: 5 mg via ORAL
  Administered 2022-05-19 – 2022-05-20 (×2): 10 mg via ORAL
  Filled 2022-05-10 (×6): qty 2
  Filled 2022-05-10: qty 1
  Filled 2022-05-10 (×20): qty 2
  Filled 2022-05-10: qty 1
  Filled 2022-05-10 (×5): qty 2

## 2022-05-10 MED ORDER — AMIODARONE HCL IN DEXTROSE 360-4.14 MG/200ML-% IV SOLN
60.0000 mg/h | INTRAVENOUS | Status: AC
  Administered 2022-05-10: 60 mg/h via INTRAVENOUS
  Filled 2022-05-10: qty 200

## 2022-05-10 MED ORDER — AMIODARONE LOAD VIA INFUSION
150.0000 mg | Freq: Once | INTRAVENOUS | Status: AC
  Administered 2022-05-10: 150 mg via INTRAVENOUS
  Filled 2022-05-10: qty 83.34

## 2022-05-10 MED ORDER — HYDROMORPHONE HCL 1 MG/ML IJ SOLN
1.0000 mg | Freq: Four times a day (QID) | INTRAMUSCULAR | Status: DC | PRN
  Administered 2022-05-10 – 2022-05-20 (×30): 1 mg via INTRAVENOUS
  Filled 2022-05-10 (×30): qty 1

## 2022-05-10 MED ORDER — AMIODARONE HCL IN DEXTROSE 360-4.14 MG/200ML-% IV SOLN: 30.0000 mg/h | INTRAVENOUS | Status: DC

## 2022-05-10 MED ORDER — AMIODARONE IV BOLUS ONLY 150 MG/100ML: 150.0000 mg | Freq: Once | INTRAVENOUS | Status: DC

## 2022-05-10 NOTE — Progress Notes (Signed)
Progress Note  Patient Name: Gregory [name] Date of Encounter: 05/10/2022  Primary Cardiologist: Sanda Klein, MD   Subjective   Feels better.   Inpatient Medications    Scheduled Meds:  atorvastatin  80 mg Oral QPM   dapagliflozin propanediol  10 mg Oral Daily   ezetimibe  10 mg Oral Daily   insulin aspart  0-9 Units Subcutaneous TID WC   levETIRAcetam  500 mg Oral BID   pantoprazole  40 mg Oral Daily   sodium chloride flush  3 mL Intravenous Q12H   sodium chloride flush  5 mL Intracatheter Q8H   Continuous Infusions:  sodium chloride     amiodarone 30 mg/hr (05/10/22 0559)   amiodarone     amiodarone     amiodarone     piperacillin-tazobactam (ZOSYN)  IV 3.375 g (05/10/22 0930)   PRN Meds: sodium chloride, acetaminophen **OR** acetaminophen, morphine injection, oxyCODONE, sodium chloride flush   Vital Signs    Vitals:   05/09/22 1745 05/09/22 1800 05/09/22 2145 05/10/22 0547  BP: 112/89 117/84 117/71 107/82  Pulse: 87 88 84 76  Resp: (!) '21 20 18 19  '$ Temp:   (!) 97.3 F (36.3 C) 97.8 F (36.6 C)  TempSrc:   Oral Oral  SpO2: 97% 100% 97% 97%  Weight:      Height:        Intake/Output Summary (Last 24 hours) at 05/10/2022 7169 Last data filed at 05/10/2022 0700 Gross per 24 hour  Intake 415.1 ml  Output 4450 ml  Net -4034.9 ml   Filed Weights   05/08/22 0841 05/08/22 1626  Weight: 95.7 kg 102.1 kg    Telemetry    Nsr with VT - Personally Reviewed  ECG    none - Personally Reviewed  Physical Exam   GEN: No acute distress.   Neck: No JVD Cardiac: RRR, no murmurs, rubs, or gallops.  Respiratory: Clear to auscultation bilaterally. GI: Soft, nontender, non-distended ; drain in place with purulent material  MS: No edema; No deformity. Neuro:  Nonfocal  Psych: Normal affect   Labs    Chemistry Recent Labs  Lab 05/08/22 0918 05/09/22 0615 05/10/22 0315 05/10/22 0636  NA 135 130* 128*  --   K 5.0 4.8 5.2* 5.1  CL 104 100  98  --   CO2 16* 22 19*  --   GLUCOSE 115* 95 119*  --   BUN 25* 25* 22  --   CREATININE 1.14 0.96 0.86  --   CALCIUM 8.3* 7.9* 7.8*  --   PROT  --   --  5.7*  --   ALBUMIN  --   --  1.6*  --   AST  --   --  56*  --   ALT  --   --  43  --   ALKPHOS  --   --  114  --   BILITOT  --   --  0.7  --   GFRNONAA >60 >60 >60  --   ANIONGAP '15 8 11  '$ --      Hematology Recent Labs  Lab 05/08/22 0918 05/09/22 0230 05/10/22 0315  WBC 13.2* 17.9* 28.8*  RBC 5.63 5.57 4.83  HGB 14.3 13.5 12.1*  HCT 48.5 46.1 39.8  MCV 86.1 82.8 82.4  MCH 25.4* 24.2* 25.1*  MCHC 29.5* 29.3* 30.4  RDW 23.9* 24.2* 24.0*  PLT 473* 438* 407*    Cardiac EnzymesNo results for input(s): TROPONINI in the last 168  hours. No results for input(s): TROPIPOC in the last 168 hours.   BNPNo results for input(s): BNP, PROBNP in the last 168 hours.   DDimer No results for input(s): DDIMER in the last 168 hours.   Radiology    CT ANGIO ABDOMEN W &/OR WO CONTRAST  Result Date: 05/09/2022 CLINICAL DATA:  Splenic artery dissection. EXAM: CT ANGIOGRAPHY ABDOMEN TECHNIQUE: Multidetector CT imaging of the abdomen was performed using the standard protocol during bolus administration of intravenous contrast. Multiplanar reconstructed images and MIPs were obtained and reviewed to evaluate the vascular anatomy. RADIATION DOSE REDUCTION: This exam was performed according to the departmental dose-optimization program which includes automated exposure control, adjustment of the mA and/or kV according to patient size and/or use of iterative reconstruction technique. CONTRAST:  9m OMNIPAQUE IOHEXOL 350 MG/ML SOLN COMPARISON:  CT of May 08, 2022. FINDINGS: VASCULAR Aorta: Atherosclerosis of abdominal aorta is noted without dissection. 3 cm fusiform infrarenal abdominal aortic aneurysm is noted. 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 No thrombus is noted. 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 The common hepatic, left gastric and splenic arteries appear to be patent. There does appear to be some extravasation of contrast arising from a distal branch of the splenic artery in the region of the splenic hilum, best seen on image number 33 of series 5. 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 SMA: Patent without evidence of aneurysm, dissection, vasculitis or significant stenosis. Renals: Both renal arteries are patent without evidence of aneurysm, dissection, vasculitis, fibromuscular dysplasia or significant stenosis. IMA: Patent without evidence of aneurysm, dissection, vasculitis or significant stenosis. Inflow: Patent without evidence of aneurysm, dissection, vasculitis or significant stenosis. Veins: No obvious venous abnormality within the limitations of this arterial phase study. Review of the MIP images confirms the above findings. NON-VASCULAR Lower chest: Bilateral pleural effusions are noted with adjacent subsegmental atelectasis. Hepatobiliary: Gallstone is noted. Dilated gallbladder is noted. No biliary dilatation is noted. Liver is otherwise unremarkable. Pancreas: There is again noted peripancreatic fluid collections including 10.6 x 4.4 cm bilobed abnormality which appears to contain a pseudoaneurysm as described above. Spleen: Normal in size. No definite splenic parenchymal abnormality is noted. Adrenals/Urinary Tract: Bilateral adrenal nodules are again noted. No hydronephrosis or renal obstruction is noted. Stomach/Bowel: Stomach is unremarkable. No definite evidence of bowel obstruction is noted. Lymphatic: No definite adenopathy is noted. Other: There is again noted a large complex fluid collection involving most of the right-sided the abdomen that measures 25 x 15 cm. Multiple air-fluid levels are noted within it suggesting internal loculations. Musculoskeletal: No acute or significant  osseous findings. 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 There is also noted small amount of contrast extravasation involving a distal branch of the splenic artery which is seen supplying pseudoaneurysm in the splenic hilum, suggesting arterial injury. These results will be called to the ordering clinician or representative by the Radiologist Assistant, and communication documented in the PACS or zVision Dashboard. 3 cm infrarenal abdominal aortic aneurysm is noted. Recommend follow-up ultrasound every 3 years. This recommendation follows ACR consensus guidelines: White Paper of the ACR Incidental Findings Committee II on Vascular Findings. J Am Coll Radiol 2013; 10:789-794. NON-VASCULAR Continued presence of large complex fluid collection in the right-sided the abdomen measuring 25 x  15 cm. This is concerning for abscess. Multiple air-fluid levels are noted within it suggesting internal loculations. Continued presence of peripancreatic fluid collections including 10.6 x 4.4 cm bilobed abnormality around the pancreatic tail which contains pseudoaneurysm described above. Bilateral adrenal nodules are again noted. Bilateral pleural effusions are again noted with associated atelectasis. Aortic Atherosclerosis (ICD10-I70.0). Electronically Signed   By: Marijo Conception M.D.   On: 05/09/2022 09:52   CT ABDOMEN PELVIS W CONTRAST  Addendum Date: 05/08/2022   ADDENDUM REPORT: 05/08/2022 19:34 ADDENDUM: These results were called by telephone at the time of interpretation on 05/08/2022 at 7:15 pm to provider Dr Georganna Skeans, who verbally acknowledged these results. Electronically Signed   By: Julian Hy M.D.   On: 05/08/2022 19:34   Result Date: 05/08/2022 CLINICAL DATA:  Abdominal pain, ileus, status post surgery EXAM: CT ABDOMEN AND PELVIS WITH CONTRAST TECHNIQUE: Multidetector CT imaging of the abdomen and pelvis  was performed using the standard protocol following bolus administration of intravenous contrast. RADIATION DOSE REDUCTION: This exam was performed according to the departmental dose-optimization program which includes automated exposure control, adjustment of the mA and/or kV according to patient size and/or use of iterative reconstruction technique. CONTRAST:  157m OMNIPAQUE IOHEXOL 300 MG/ML  SOLN COMPARISON:  Abdominal radiograph dated 05/08/2022. CT abdomen/pelvis dated 04/16/2022. FINDINGS: Lower chest: Moderate left and small right pleural effusions. Bilateral lower lobe atelectasis. Hepatobiliary: Subcentimeter hepatic cysts. Gallbladder is notable for a 6 mm layering gallstone (series 3/image 40). No associated inflammatory changes. No intrahepatic or extrahepatic duct dilatation. Pancreas: Pancreas is notable for peripancreatic fluid collections, including a dominant 4.9 cm collection along the pancreatic tail (series 3/image 28), previously 4.4 cm. Within this collection, there has been development of a 2.7 cm pseudoaneurysm (series 3/image 25), which is new from the prior. Spleen: Within normal limits. Adrenals/Urinary Tract: Multiple bilateral adrenal nodules, measuring up to 2.0 cm on the left (series 8/image 15), likely reflecting benign adrenal adenomas, unchanged. Multiple bilateral renal cysts, measuring up to 2.7 cm in the medial left upper pole (series 8/image 18), benign. No hydronephrosis. Bladder is within normal limits. Stomach/Bowel: Stomach is notable for a tiny hiatal hernia. Status post right hemicolectomy with appendectomy. No evidence of bowel obstruction. Vascular/Lymphatic: Infrarenal abdominal aorta measures up to 3.0 cm (series 3/image 56). Atherosclerotic calcifications of the abdominal aorta and branch vessels. No suspicious abdominopelvic lymphadenopathy. Reproductive: Prostate is unremarkable. Other: Large fluid in gas collection with enhancing rim in the abdomen/pelvis,  measuring at least 14.8 x 28.2 x 20.6 cm, reflecting a postoperative abscess. Additional mild abdominopelvic ascites. No free air. Musculoskeletal: Mild degenerative changes of the visualized thoracolumbar spine. IMPRESSION: Large postoperative abscess in the abdomen/pelvis, measuring at least 14.8 x 28.2 x 20.6 cm. Consider surgical washout versus drainage by interventional radiology, as clinically warranted. 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 Interventional radiology consultation is suggested for embolization. Additional ancillary findings as above. Aortic Atherosclerosis (ICD10-I70.0). Electronically Signed: By: SJulian HyM.D. On: 05/08/2022 19:09   DG Abdomen Acute W/Chest  Result Date: 05/08/2022 CLINICAL DATA:  Abdominal pain EXAM: DG ABDOMEN ACUTE WITH 1 VIEW CHEST COMPARISON:  Radiograph 04/21/2022 FINDINGS: Diffuse gaseous distension of small and large bowel with some dilated small bowel loops noted. There is no evidence of free intraperitoneal gas. No abnormal calcifications. Cardiomegaly with unchanged pacemaker/AICD leads. There are bibasilar opacities, left greater than right. No large pleural effusion. No visible pneumothorax. There  is no acute osseous abnormality. Degenerative changes of the spine. 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 IMPRESSION: Diffuse gaseous distension of small and large bowel with some dilated small bowel loops. Findings are compatible with ileus. Bibasilar airspace opacities, could be atelectasis or infection. Electronically Signed   By: Maurine Simmering M.D.   On: 05/08/2022 13:15    Cardiac Studies   none  Patient Profile     61 y.o. male admitted with VT storm and abdominal abscess after colon resection for metastatic CA  Assessment & Plan    VT - he will continue IV amio. He is still having episodes lasting  minutes but is essentially asymptomatic. Continue IV amio for now and we will switch to oral amio on Monday.  Abdominal abscess - he is s/p drain placement with over 3 liters of pus removed.  CAD - he denies anginal symptoms. He has an occluded RCA and LCX.      For questions or updates, please contact [place] Please consult [email or web address] for contact info under Cardiology/STEMI.      Signed, Cristopher Peru, MD  05/10/2022, 9:38 AM

## 2022-05-10 NOTE — Progress Notes (Signed)
Referring Physician(s): Dr. Kae Heller   Supervising Physician: Mir, Sharen Heck  Patient Status:  [facility] - In-pt  Chief Complaint:  Intra-abdominal fluid collection Splenic artery pseudoaneurysm  Subjective: Patient report he feels better today than yesterday.  Significant elevation in WBC today 28.8 Afebrile.   Allergies: Patient has no known allergies.  Medications: Prior to Admission medications   Medication Sig Start Date End Date Taking? Authorizing Provider  acetaminophen (TYLENOL) 500 MG tablet Take 1,000 mg by mouth every 6 (six) hours as needed for moderate pain.   Yes [provider]  amiodarone (PACERONE) 200 MG tablet Take 2 tablets (400 mg total) by mouth daily. 04/24/22  Yes Patrecia Pour, MD  apixaban (ELIQUIS) 5 MG TABS tablet TAKE 1 TABLET(5 MG) BY MOUTH TWICE DAILY Patient taking differently: Take 5 mg by mouth 2 (two) times daily. 04/11/22  Yes Croitoru, [name], MD  atorvastatin (LIPITOR) 80 MG tablet TAKE ONE TABLET BY MOUTH EVERY EVENING Patient taking differently: Take 80 mg by mouth every evening. 05/05/22  Yes Croitoru, [name], MD  clopidogrel (PLAVIX) 75 MG tablet TAKE 1 TABLET(75 MG) BY MOUTH DAILY Patient taking differently: Take 75 mg by mouth daily. TAKE 1 TABLET(75 MG) BY MOUTH DAILY 10/02/21  Yes Croitoru, [name], MD  dapagliflozin propanediol (FARXIGA) 10 MG TABS tablet Take 1 tablet (10 mg total) by mouth daily. 05/08/22  Yes Croitoru, [name], MD  ezetimibe (ZETIA) 10 MG tablet Take 1 tablet (10 mg total) by mouth daily. 03/13/22  Yes Croitoru, [name], MD  furosemide (LASIX) 20 MG tablet Take 2 tablets (40 mg total) by mouth 3 (three) times a week. Patient taking differently: Take 40 mg by mouth every Monday, Wednesday, and Friday. 03/12/22  Yes [name], MD  levETIRAcetam (KEPPRA) 500 MG tablet TAKE 1 TABLET(500 MG) BY MOUTH TWICE DAILY Patient taking differently: Take 500 mg by mouth 2 (two) times daily. 04/28/22  Yes [name], MD   metoprolol succinate (TOPROL-XL) 25 MG 24 hr tablet Take one tablet, 25 mg, in the morning and 50 mg, two tablets, in the evening. Patient taking differently: 25 mg. Per patient taking 25 mg in the morning and 25 mg at night 06/27/21  Yes Croitoru, [name], MD  oxyCODONE (OXY IR/ROXICODONE) 5 MG immediate release tablet Take 1-2 tablets (5-10 mg total) by mouth every 8 (eight) hours as needed for breakthrough pain. 05/05/22  Yes [name], MD  potassium chloride (KLOR-CON M) 10 MEQ tablet Take 1 tablet (10 mEq total) by mouth daily. 12/02/21  Yes Croitoru, [name], MD  sacubitril-valsartan (ENTRESTO) 49-51 MG Take 1 tablet by mouth 2 (two) times daily. 10/07/21  Yes Croitoru, [name], MD  pantoprazole (PROTONIX) 40 MG tablet TAKE 1 TABLET(40 MG) BY MOUTH DAILY Patient taking differently: Take 40 mg by mouth daily. TAKE 1 TABLET(40 MG) BY MOUTH DAILY 03/13/22   Croitoru, [name], MD  sildenafil (VIAGRA) 50 MG tablet Take 1 tablet (50 mg total) by mouth as needed for erectile dysfunction. 01/25/21   Shirley Friar, PA-C     Vital Signs: BP 112/78 (BP Location: Right Arm)   Pulse 72   Temp 97.8 F (36.6 C) (Oral)   Resp 18   Ht '6\' 6"'$  (1.981 m)   Wt 225 lb 1.4 oz (102.1 kg)   SpO2 97%   BMI 26.01 kg/m   Physical Exam NAD, alert Abdomen: RUQ drain in place with insertion site c/d/I.  Beige/yellow-tinged fluid in gravity bag.  Drain flushes and aspirates easily.  Groin procedure site intact.  No hematoma, tenderness, or evidence of pseudoaneurysm .  Imaging: CT ANGIO ABDOMEN W &/OR WO CONTRAST  Result Date: 05/09/2022 CLINICAL DATA:  Splenic artery dissection. EXAM: CT ANGIOGRAPHY ABDOMEN TECHNIQUE: Multidetector CT imaging of the abdomen was performed using the standard protocol during bolus administration of intravenous contrast. Multiplanar reconstructed images and MIPs were obtained and reviewed to evaluate the vascular anatomy. RADIATION DOSE REDUCTION: This exam was performed according  to the departmental dose-optimization program which includes automated exposure control, adjustment of the mA and/or kV according to patient size and/or use of iterative reconstruction technique. CONTRAST:  25m OMNIPAQUE IOHEXOL 350 MG/ML SOLN COMPARISON:  CT of May 08, 2022. FINDINGS: VASCULAR Aorta: Atherosclerosis of abdominal aorta is noted without dissection. 3 cm fusiform infrarenal abdominal aortic aneurysm is noted. 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 No thrombus is noted. 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 The common hepatic, left gastric and splenic arteries appear to be patent. There does appear to be some extravasation of contrast arising from a distal branch of the splenic artery in the region of the splenic hilum, best seen on image number 33 of series 5. 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 SMA: Patent without evidence of aneurysm, dissection, vasculitis or significant stenosis. Renals: Both renal arteries are patent without evidence of aneurysm, dissection, vasculitis, fibromuscular dysplasia or significant stenosis. IMA: Patent without evidence of aneurysm, dissection, vasculitis or significant stenosis. Inflow: Patent without evidence of aneurysm, dissection, vasculitis or significant stenosis. Veins: No obvious venous abnormality within the limitations of this arterial phase study. Review of the MIP images confirms the above findings. NON-VASCULAR Lower chest: Bilateral pleural effusions are noted with adjacent subsegmental atelectasis. Hepatobiliary: Gallstone is noted. Dilated gallbladder is noted. No biliary dilatation is noted. Liver is otherwise unremarkable. Pancreas: There is again noted peripancreatic fluid collections including 10.6 x 4.4 cm bilobed abnormality which appears to contain a pseudoaneurysm as described above. Spleen:  Normal in size. No definite splenic parenchymal abnormality is noted. Adrenals/Urinary Tract: Bilateral adrenal nodules are again noted. No hydronephrosis or renal obstruction is noted. Stomach/Bowel: Stomach is unremarkable. No definite evidence of bowel obstruction is noted. Lymphatic: No definite adenopathy is noted. Other: There is again noted a large complex fluid collection involving most of the right-sided the abdomen that measures 25 x 15 cm. Multiple air-fluid levels are noted within it suggesting internal loculations. Musculoskeletal: No acute or significant osseous findings. 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 There is also noted small amount of contrast extravasation involving a distal branch of the splenic artery which is seen supplying pseudoaneurysm in the splenic hilum, suggesting arterial injury. These results will be called to the ordering clinician or representative by the Radiologist Assistant, and communication documented in the PACS or zVision Dashboard. 3 cm infrarenal abdominal aortic aneurysm is noted. Recommend follow-up ultrasound every 3 years. This recommendation follows ACR consensus guidelines: White Paper of the ACR Incidental Findings Committee II on Vascular Findings. J Am Coll Radiol 2013; 10:789-794. NON-VASCULAR Continued presence of large complex fluid collection in the right-sided the abdomen measuring 25 x 15 cm. This is concerning for abscess. Multiple air-fluid levels are noted within it suggesting internal loculations. Continued presence of peripancreatic fluid collections including 10.6 x 4.4 cm  bilobed abnormality around the pancreatic tail which contains pseudoaneurysm described above. Bilateral adrenal nodules are again noted. Bilateral pleural effusions are again noted with associated atelectasis. Aortic Atherosclerosis (ICD10-I70.0). Electronically Signed   By: Marijo Conception M.D.   On: 05/09/2022 09:52   CT ABDOMEN PELVIS W CONTRAST  Addendum Date: 05/08/2022   ADDENDUM REPORT: 05/08/2022 19:34 ADDENDUM: These results were called by telephone at the time of interpretation on 05/08/2022 at 7:15 pm to provider Dr Georganna Skeans, who verbally acknowledged these results. Electronically Signed   By: Julian Hy M.D.   On: 05/08/2022 19:34   Result Date: 05/08/2022 CLINICAL DATA:  Abdominal pain, ileus, status post surgery EXAM: CT ABDOMEN AND PELVIS WITH CONTRAST TECHNIQUE: Multidetector CT imaging of the abdomen and pelvis was performed using the standard protocol following bolus administration of intravenous contrast. RADIATION DOSE REDUCTION: This exam was performed according to the departmental dose-optimization program which includes automated exposure control, adjustment of the mA and/or kV according to patient size and/or use of iterative reconstruction technique. CONTRAST:  146m OMNIPAQUE IOHEXOL 300 MG/ML  SOLN COMPARISON:  Abdominal radiograph dated 05/08/2022. CT abdomen/pelvis dated 04/16/2022. FINDINGS: Lower chest: Moderate left and small right pleural effusions. Bilateral lower lobe atelectasis. Hepatobiliary: Subcentimeter hepatic cysts. Gallbladder is notable for a 6 mm layering gallstone (series 3/image 40). No associated inflammatory changes. No intrahepatic or extrahepatic duct dilatation. Pancreas: Pancreas is notable for peripancreatic fluid collections, including a dominant 4.9 cm collection along the pancreatic tail (series 3/image 28), previously 4.4 cm. Within this collection, there has been development of a 2.7 cm pseudoaneurysm (series 3/image 25), which is new from the prior. Spleen: Within normal limits. Adrenals/Urinary Tract: Multiple bilateral adrenal nodules, measuring up to 2.0 cm on the left (series 8/image 15), likely reflecting benign adrenal adenomas, unchanged. Multiple bilateral renal cysts, measuring up to 2.7 cm in the medial  left upper pole (series 8/image 18), benign. No hydronephrosis. Bladder is within normal limits. Stomach/Bowel: Stomach is notable for a tiny hiatal hernia. Status post right hemicolectomy with appendectomy. No evidence of bowel obstruction. Vascular/Lymphatic: Infrarenal abdominal aorta measures up to 3.0 cm (series 3/image 56). Atherosclerotic calcifications of the abdominal aorta and branch vessels. No suspicious abdominopelvic lymphadenopathy. Reproductive: Prostate is unremarkable. Other: Large fluid in gas collection with enhancing rim in the abdomen/pelvis, measuring at least 14.8 x 28.2 x 20.6 cm, reflecting a postoperative abscess. Additional mild abdominopelvic ascites. No free air. Musculoskeletal: Mild degenerative changes of the visualized thoracolumbar spine. IMPRESSION: Large postoperative abscess in the abdomen/pelvis, measuring at least 14.8 x 28.2 x 20.6 cm. Consider surgical washout versus drainage by interventional radiology, as clinically warranted. 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 Interventional radiology consultation is suggested for embolization. Additional ancillary findings as above. Aortic Atherosclerosis (ICD10-I70.0). Electronically Signed: By: SJulian HyM.D. 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 Ultrasound-guided right abdominal abscess drain placement 2. Ultrasound-guided access of right common femoral artery 3. Celiac angiogram 4. Splenic artery angiogram and distal splenic branch embolization (x2) 5.  Angio-Seal closure of right common femoral artery access MEDICATIONS: Patient currently on inpatient regiment of IV Zosyn. ANESTHESIA/SEDATION: Moderate (  conscious) sedation was employed during this procedure. A total of Versed 1 mg and Fentanyl 50 mcg was administered intravenously by the radiology nurse. Total intra-service moderate Sedation Time: 160 minutes. The patient's level of consciousness and vital signs were monitored continuously by radiology nursing throughout the procedure under my direct supervision. CONTRAST:  130 mL intra arterial Omnipaque 300 FLUOROSCOPY: Radiation Exposure Index (as provided by the fluoroscopic device): 283 mGy Kerma COMPLICATIONS: None immediate. PROCEDURE: Informed consent was obtained from the patient following explanation of the procedure, risks, benefits and alternatives. The patient understands, agrees and consents for the procedure. All questions were addressed. A time out was performed prior to the initiation of the procedure. Maximal barrier sterile technique utilized including caps, mask, sterile gowns, sterile gloves, large sterile drape, hand hygiene, and chlorhexidine prep. Right abdominal wall skin prepped and draped in usual fashion. Ultrasound evaluation showed large loculated abscess. Following local lidocaine administration, 19 gauge Yueh needle was advanced into the right abdominal collection using continuous ultrasound guidance. Yueh catheter removed over 0.035 inch guidewire, serial dilation performed, and 14.0 [place] multipurpose pigtail drain placed. 20 mL purulent sample aspirated and sent for Gram stain and culture. Total of 2700 mL of purulent fluid drained from the collection. Drain secured to skin with suture and connected to bag. Right groin skin prepped and draped usual fashion. Ultrasound image documenting patency of the right common femoral artery was obtained and placed in permanent medical record. Sterile ultrasound probe cover and gel utilized  throughout the procedure. Utilizing continuous ultrasound guidance, the right common femoral artery was accessed at the level of the femoral head with a 21 gauge needle. 21 gauge needle exchanged for a transitional dilator set over 0.018 inch guidewire. Transitional dilator set exchanged for 5 French sheath over 0.035 inch guidewire. Sos omni catheter utilized to select the celiac artery. 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 Common hepatic, left gastric, and splenic arteries are patent. 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 Lantern microcatheter was advanced into the splenic artery, however the base catheter was pushed out while trying to advance the microcatheter. Sos omni catheter was exchanged for a C2 catheter. C2 catheter advanced into the splenic artery utilizing Glidewire. Splenic angiogram again showed filling of the pseudoaneurysm. Lantern microcatheter advanced further into the splenic artery and repeat angiogram was performed. There was intermittent filling of the pseudoaneurysm it was very difficult to determine which branch of the splenic artery supplied the pseudoaneurysm. Lantern microcatheter advanced into 1 of the splenic artery branches nearest the pseudoaneurysm and angiogram was performed. Although no direct filling of the pseudoaneurysm was seen, irregular vessels were identified in this area. This artery was embolized with combination of 4 mm Ruby coils. The next closest branch of the splenic artery was selected and angiogram was performed which showed flow into the pseudoaneurysm. The microcatheter was advanced beyond the origin of the pseudoaneurysm and the artery was embolized with 4 mm Ruby coils. The microcatheter was then retracted and embolization was completed with 4 mm Ruby coils. Microcatheter was removed. Splenic angiogram showed no additional flow into the pseudoaneurysm. Sheath angiogram  demonstrated appropriate puncture at the level of the common femoral head. The groin was re-prepped and draped and closure was performed with 6 [place] Angio-Seal device. IMPRESSION: 1. 14.0 French drain placed in right abdominal abscess, removing 2700 mL of purulent fluid. 2. Splenic angiogram shows active extravasation from distal splenic branch, 2 of which  were coil embolized. The branch responsible for directly supplying the pseudoaneurysm was successfully embolized distal and proximal to origin of the pseudoaneurysm. Electronically Signed   By: Miachel Roux M.D.   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 Ultrasound-guided right abdominal abscess drain placement 2. Ultrasound-guided access of right common femoral artery 3. Celiac angiogram 4. Splenic artery angiogram and distal splenic branch embolization (x2) 5. Angio-Seal closure of right common femoral artery access MEDICATIONS: Patient currently on inpatient regiment of IV Zosyn. ANESTHESIA/SEDATION: Moderate (conscious) sedation was employed during this procedure. A total of Versed 1 mg and Fentanyl 50 mcg was administered intravenously by the radiology nurse. Total intra-service moderate Sedation Time: 160 minutes. The patient's level of consciousness and vital signs were monitored continuously by radiology nursing throughout the procedure under my direct supervision. CONTRAST:  130 mL intra arterial Omnipaque 300 FLUOROSCOPY: Radiation Exposure Index (as provided by the fluoroscopic device): 220 mGy Kerma COMPLICATIONS: None immediate. PROCEDURE: Informed consent was obtained from  the patient following explanation of the procedure, risks, benefits and alternatives. The patient understands, agrees and consents for the procedure. All questions were addressed. A time out was performed prior to the initiation of the procedure. Maximal barrier sterile technique utilized including caps, mask, sterile gowns, sterile gloves, large sterile drape, hand hygiene, and chlorhexidine prep. Right abdominal wall skin prepped and draped in usual fashion. Ultrasound evaluation showed large loculated abscess. Following local lidocaine administration, 19 gauge Yueh needle was advanced into the right abdominal collection using continuous ultrasound guidance. Yueh catheter removed over 0.035 inch guidewire, serial dilation performed, and 14.0 [place] multipurpose pigtail drain placed. 20 mL purulent sample aspirated and sent for Gram stain and culture. Total of 2700 mL of purulent fluid drained from the collection. Drain secured to skin with suture and connected to bag. Right groin skin prepped and draped usual fashion. Ultrasound image documenting patency of the right common femoral artery was obtained and placed in permanent medical record. Sterile ultrasound probe cover and gel utilized throughout the procedure. Utilizing continuous ultrasound guidance, the right common femoral artery was accessed at the level of the femoral head with a 21 gauge needle. 21 gauge needle exchanged for a transitional dilator set over 0.018 inch guidewire. Transitional dilator set exchanged for 5 French sheath over 0.035 inch guidewire. Sos omni catheter utilized to select the celiac artery. 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 Common hepatic, left gastric, and splenic arteries are patent. 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 Lantern microcatheter was advanced into the splenic artery, however the base catheter was pushed out while trying to  advance the microcatheter. Sos omni catheter was exchanged for a C2 catheter. C2 catheter advanced into the splenic artery utilizing Glidewire. Splenic angiogram again showed filling of the pseudoaneurysm. Lantern microcatheter advanced further into the splenic artery and repeat angiogram was performed. There was intermittent filling of the pseudoaneurysm it was very difficult to determine which branch of the splenic artery supplied the pseudoaneurysm. Lantern microcatheter advanced into 1 of the splenic artery branches nearest the pseudoaneurysm and angiogram was performed. Although no direct filling of the pseudoaneurysm was seen, irregular  vessels were identified in this area. This artery was embolized with combination of 4 mm Ruby coils. The next closest branch of the splenic artery was selected and angiogram was performed which showed flow into the pseudoaneurysm. The microcatheter was advanced beyond the origin of the pseudoaneurysm and the artery was embolized with 4 mm Ruby coils. The microcatheter was then retracted and embolization was completed with 4 mm Ruby coils. Microcatheter was removed. Splenic angiogram showed no additional flow into the pseudoaneurysm. Sheath angiogram demonstrated appropriate puncture at the level of the common femoral head. The groin was re-prepped and draped and closure was performed with 6 [place] Angio-Seal device. IMPRESSION: 1. 14.0 French drain placed in right abdominal abscess, removing 2700 mL of purulent fluid. 2. Splenic angiogram shows active extravasation from distal splenic branch, 2 of which were coil embolized. The branch responsible for directly supplying the pseudoaneurysm was successfully embolized distal and proximal to origin of the pseudoaneurysm. Electronically Signed   By: Miachel Roux M.D.   On: 05/10/2022 11:19   IR US Guide Vasc Access Right  Result Date: 05/10/2022 INDICATION: 61 year old gentleman with history of chronic pancreatitis resulting in  splenic artery pseudoaneurysm and right abdominal abscess status post right colectomy for removal of colonic mass presents to IR for splenic artery angiogram and possible embolization, as well as, abdominal abscess drain placement EXAM: 1. Ultrasound-guided right abdominal abscess drain placement 2. Ultrasound-guided access of right common femoral artery 3. Celiac angiogram 4. Splenic artery angiogram and distal splenic branch embolization (x2) 5. Angio-Seal closure of right common femoral artery access MEDICATIONS: Patient currently on inpatient regiment of IV Zosyn. ANESTHESIA/SEDATION: Moderate (conscious) sedation was employed during this procedure. A total of Versed 1 mg and Fentanyl 50 mcg was administered intravenously by the radiology nurse. Total intra-service moderate Sedation Time: 160 minutes. The patient's level of consciousness and vital signs were monitored continuously by radiology nursing throughout the procedure under my direct supervision. CONTRAST:  130 mL intra arterial Omnipaque 300 FLUOROSCOPY: Radiation Exposure Index (as provided by the fluoroscopic device): 607 mGy Kerma COMPLICATIONS: None immediate. PROCEDURE: Informed consent was obtained from the patient following explanation of the procedure, risks, benefits and alternatives. The patient understands, agrees and consents for the procedure. All questions were addressed. A time out was performed prior to the initiation of the procedure. Maximal barrier sterile technique utilized including caps, mask, sterile gowns, sterile gloves, large sterile drape, hand hygiene, and chlorhexidine prep. Right abdominal wall skin prepped and draped in usual fashion. Ultrasound evaluation showed large loculated abscess. Following local lidocaine administration, 19 gauge Yueh needle was advanced into the right abdominal collection using continuous ultrasound guidance. Yueh catheter removed over 0.035 inch guidewire, serial dilation performed, and 14.0  [place] multipurpose pigtail drain placed. 20 mL purulent sample aspirated and sent for Gram stain and culture. Total of 2700 mL of purulent fluid drained from the collection. Drain secured to skin with suture and connected to bag. Right groin skin prepped and draped usual fashion. Ultrasound image documenting patency of the right common femoral artery was obtained and placed in permanent medical record. Sterile ultrasound probe cover and gel utilized throughout the procedure. Utilizing continuous ultrasound guidance, the right common femoral artery was accessed at the level of the femoral head with a 21 gauge needle. 21 gauge needle exchanged for a transitional dilator set over 0.018 inch guidewire. Transitional dilator set exchanged for 5 French sheath over 0.035 inch guidewire. Sos omni catheter utilized to select the celiac artery. 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 Common hepatic, left gastric, and splenic arteries are patent. 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 Lantern microcatheter was advanced into the splenic artery, however the base catheter was pushed out while trying to advance the microcatheter. Sos omni catheter was exchanged for a C2 catheter. C2 catheter advanced into the splenic artery utilizing Glidewire. Splenic angiogram again showed filling of the pseudoaneurysm. Lantern microcatheter advanced further into the splenic artery and repeat angiogram was performed. There was intermittent filling of the pseudoaneurysm it was very difficult to determine which branch of the splenic artery supplied the pseudoaneurysm. Lantern microcatheter advanced into 1 of the splenic artery branches nearest the pseudoaneurysm and angiogram was performed. Although no direct filling of the pseudoaneurysm was seen, irregular vessels were identified in this area. This artery was embolized with combination of 4 mm Ruby coils.  The next closest branch of the splenic artery was selected and angiogram was performed which showed flow into the pseudoaneurysm. The microcatheter was advanced beyond the origin of the pseudoaneurysm and the artery was embolized with 4 mm Ruby coils. The microcatheter was then retracted and embolization was completed with 4 mm Ruby coils. Microcatheter was removed. Splenic angiogram showed no additional flow into the pseudoaneurysm. Sheath angiogram demonstrated appropriate puncture at the level of the common femoral head. The groin was re-prepped and draped and closure was performed with 6 [place] Angio-Seal device. IMPRESSION: 1. 14.0 French drain placed in right abdominal abscess, removing 2700 mL of purulent fluid. 2. Splenic angiogram shows active extravasation from distal splenic branch, 2 of which were coil embolized. The branch responsible for directly supplying the pseudoaneurysm was successfully embolized distal and proximal to origin of the pseudoaneurysm. Electronically Signed   By: Miachel Roux M.D.   On: 05/10/2022 11:19   DG Abdomen Acute W/Chest  Result Date: 05/08/2022 CLINICAL DATA:  Abdominal pain EXAM: DG ABDOMEN ACUTE WITH 1 VIEW CHEST COMPARISON:  Radiograph 04/21/2022 FINDINGS: Diffuse gaseous distension of small and large bowel with some dilated small bowel loops noted. There is no evidence of free intraperitoneal gas. No abnormal calcifications. Cardiomegaly with unchanged pacemaker/AICD leads. There are bibasilar opacities, left greater than right. No large pleural effusion. No visible pneumothorax. There is no acute osseous abnormality. Degenerative changes of the spine. 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 IMPRESSION: Diffuse gaseous distension of small and large bowel with some dilated small bowel loops. Findings are compatible with ileus. Bibasilar airspace opacities, could be atelectasis or infection. Electronically Signed   By: Maurine Simmering  M.D.   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 Ultrasound-guided right abdominal abscess drain placement 2. Ultrasound-guided access of right common femoral artery 3. Celiac angiogram 4. Splenic artery angiogram and distal splenic branch embolization (x2) 5. Angio-Seal closure of right common femoral artery access MEDICATIONS: Patient currently on inpatient regiment of IV Zosyn. ANESTHESIA/SEDATION: Moderate (conscious) sedation was employed during this procedure. A total of Versed 1 mg and Fentanyl 50 mcg was administered intravenously by the radiology nurse. Total intra-service moderate Sedation Time: 160 minutes. The patient's level of  consciousness and vital signs were monitored continuously by radiology nursing throughout the procedure under my direct supervision. CONTRAST:  130 mL intra arterial Omnipaque 300 FLUOROSCOPY: Radiation Exposure Index (as provided by the fluoroscopic device): 932 mGy Kerma COMPLICATIONS: None immediate. PROCEDURE: Informed consent was obtained from the patient following explanation of the procedure, risks, benefits and alternatives. The patient understands, agrees and consents for the procedure. All questions were addressed. A time out was performed prior to the initiation of the procedure. Maximal barrier sterile technique utilized including caps, mask, sterile gowns, sterile gloves, large sterile drape, hand hygiene, and chlorhexidine prep. Right abdominal wall skin prepped and draped in usual fashion. Ultrasound evaluation showed large loculated abscess. Following local lidocaine  administration, 19 gauge Yueh needle was advanced into the right abdominal collection using continuous ultrasound guidance. Yueh catheter removed over 0.035 inch guidewire, serial dilation performed, and 14.0 [place] multipurpose pigtail drain placed. 20 mL purulent sample aspirated and sent for Gram stain and culture. Total of 2700 mL of purulent fluid drained from the collection. Drain secured to skin with suture and connected to bag. Right groin skin prepped and draped usual fashion. Ultrasound image documenting patency of the right common femoral artery was obtained and placed in permanent medical record. Sterile ultrasound probe cover and gel utilized throughout the procedure. Utilizing continuous ultrasound guidance, the right common femoral artery was accessed at the level of the femoral head with a 21 gauge needle. 21 gauge needle exchanged for a transitional dilator set over 0.018 inch guidewire. Transitional dilator set exchanged for 5 French sheath over 0.035 inch guidewire. Sos omni catheter utilized to select the celiac artery. 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 Common hepatic, left gastric, and splenic arteries are patent. 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 Lantern microcatheter was advanced into the splenic artery, however the base catheter was pushed out while trying to advance the microcatheter. Sos omni catheter was exchanged for a C2 catheter. C2 catheter advanced into the splenic artery utilizing Glidewire. Splenic angiogram again showed filling of the pseudoaneurysm. Lantern microcatheter advanced further into the splenic artery and repeat angiogram was performed. There was intermittent filling of the pseudoaneurysm it was very difficult to determine which branch of the splenic artery supplied the pseudoaneurysm. Lantern microcatheter advanced into 1 of the splenic artery branches nearest the  pseudoaneurysm and angiogram was performed. Although no direct filling of the pseudoaneurysm was seen, irregular vessels were identified in this area. This artery was embolized with combination of 4 mm Ruby coils. The next closest branch of the splenic artery was selected and angiogram was performed which showed flow into the pseudoaneurysm. The microcatheter was advanced beyond the origin of the pseudoaneurysm and the artery was embolized with 4 mm Ruby coils. The microcatheter was then retracted and embolization was completed with 4 mm Ruby coils. Microcatheter was removed. Splenic angiogram showed no additional flow into the pseudoaneurysm. Sheath angiogram demonstrated appropriate puncture at the level of the common femoral head. The groin was re-prepped and draped and closure was performed with 6 [place] Angio-Seal device. IMPRESSION: 1. 14.0 French drain placed in right abdominal abscess, removing 2700 mL of purulent fluid. 2. Splenic angiogram shows active extravasation from distal splenic branch, 2 of which were coil embolized. The branch responsible for directly supplying the pseudoaneurysm was successfully embolized distal and proximal to origin of the pseudoaneurysm. Electronically Signed   By: Miachel Roux M.D.   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 Ultrasound-guided right abdominal abscess drain placement 2. Ultrasound-guided access of right common femoral artery 3. Celiac angiogram 4. Splenic artery angiogram and distal splenic branch embolization (x2) 5. Angio-Seal closure of right common femoral artery access  MEDICATIONS: Patient currently on inpatient regiment of IV Zosyn. ANESTHESIA/SEDATION: Moderate (conscious) sedation was employed during this procedure. A total of Versed 1 mg and Fentanyl 50 mcg was administered intravenously by the radiology nurse. Total intra-service moderate Sedation Time: 160 minutes. The patient's level of consciousness and vital signs were monitored continuously by radiology nursing throughout the procedure under my direct supervision. CONTRAST:  130 mL intra arterial Omnipaque 300 FLUOROSCOPY: Radiation Exposure Index (as provided by the fluoroscopic device): 599 mGy Kerma COMPLICATIONS: None immediate. PROCEDURE: Informed consent was obtained from the patient following explanation of the procedure, risks, benefits and alternatives. The patient understands, agrees and consents for the procedure. All questions were addressed. A time out was performed prior to the initiation of the procedure. Maximal barrier sterile technique utilized including caps, mask, sterile gowns, sterile gloves, large sterile drape, hand hygiene, and chlorhexidine prep. Right abdominal wall skin prepped and draped in usual fashion. Ultrasound evaluation showed large loculated abscess. Following local lidocaine administration, 19 gauge Yueh needle was advanced into the right abdominal collection using continuous ultrasound guidance. Yueh catheter removed over 0.035 inch guidewire, serial dilation performed, and 14.0 [place] multipurpose pigtail drain placed. 20 mL purulent sample aspirated and sent for Gram stain and culture. Total of 2700 mL of purulent fluid drained from the collection. Drain secured to skin with suture and connected to bag. Right groin skin prepped and draped usual fashion. Ultrasound image documenting patency of the right common femoral artery was obtained and placed in permanent medical record. Sterile ultrasound probe cover and gel utilized throughout the procedure. Utilizing continuous ultrasound  guidance, the right common femoral artery was accessed at the level of the femoral head with a 21 gauge needle. 21 gauge needle exchanged for a transitional dilator set over 0.018 inch guidewire. Transitional dilator set exchanged for 5 French sheath over 0.035 inch guidewire. Sos omni catheter utilized to select the celiac artery. 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 Common hepatic, left gastric, and splenic arteries are patent. 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 Lantern microcatheter was advanced into the splenic artery, however the base catheter was pushed out while trying to advance the microcatheter. Sos omni catheter was exchanged for a C2 catheter. C2 catheter advanced into the splenic artery utilizing Glidewire. Splenic angiogram again showed filling of the pseudoaneurysm. Lantern microcatheter advanced further into the splenic artery and repeat angiogram was performed. There was intermittent filling of the pseudoaneurysm it was very difficult to determine which branch of the splenic artery supplied the pseudoaneurysm. Lantern microcatheter advanced into 1 of the splenic artery branches nearest the pseudoaneurysm and angiogram was performed. Although no direct filling of the pseudoaneurysm was seen, irregular vessels were identified in this area. This artery was embolized with combination of 4 mm Ruby coils. The next closest branch of the splenic artery was selected and angiogram was performed which  showed flow into the pseudoaneurysm. The microcatheter was advanced beyond the origin of the pseudoaneurysm and the artery was embolized with 4 mm Ruby coils. The microcatheter was then retracted and embolization was completed with 4 mm Ruby coils. Microcatheter was removed. Splenic angiogram showed no additional flow into the pseudoaneurysm. Sheath angiogram demonstrated appropriate puncture at the level of the common  femoral head. The groin was re-prepped and draped and closure was performed with 6 [place] Angio-Seal device. IMPRESSION: 1. 14.0 French drain placed in right abdominal abscess, removing 2700 mL of purulent fluid. 2. Splenic angiogram shows active extravasation from distal splenic branch, 2 of which were coil embolized. The branch responsible for directly supplying the pseudoaneurysm was successfully embolized distal and proximal to origin of the pseudoaneurysm. Electronically Signed   By: Miachel Roux M.D.   On: 05/10/2022 11:19    Labs:  CBC: Recent Labs    05/05/22 1628 05/08/22 0918 05/09/22 0230 05/10/22 0315  WBC 7.0 13.2* 17.9* 28.8*  HGB 12.7* 14.3 13.5 12.1*  HCT 41.6  43.4 48.5 46.1 39.8  PLT 599* 473* 438* 407*    COAGS: Recent Labs    05/08/22 1637 05/09/22 1325  INR 1.7* 1.6*    BMP: Recent Labs    04/23/22 0322 05/08/22 0918 05/09/22 0615 05/10/22 0315 05/10/22 0636  NA 137 135 130* 128*  --   K 3.9 5.0 4.8 5.2* 5.1  CL 106 104 100 98  --   CO2 27 16* 22 19*  --   GLUCOSE 115* 115* 95 119*  --   BUN 9 25* 25* 22  --   CALCIUM 8.2* 8.3* 7.9* 7.8*  --   CREATININE 0.78 1.14 0.96 0.86  --   GFRNONAA >60 >60 >60 >60  --     LIVER FUNCTION TESTS: Recent Labs    02/28/22 1304 04/11/22 2107 04/21/22 0950 05/10/22 0315  BILITOT 0.2 0.1* 1.0 0.7  AST 16 13* 28 56*  ALT 8 12 53* 43  ALKPHOS 69 60 43 114  PROT 6.5 6.2* 5.7* 5.7*  ALBUMIN 4.0 3.1* 2.3* 1.6*    Assessment and Plan:  Intra-abdominal fluid collection  Drain Location: RUQ Size: Fr size: 14 Fr Date of placement: 05/09/22, [place]  Currently to: [employer] collection device: gravity 24 hour output:  Output by Drain (mL) 05/08/22 0701 - 05/08/22 1900 05/08/22 1901 - 05/09/22 0700 05/09/22 0701 - 05/09/22 1900 05/09/22 1901 - 05/10/22 0700 05/10/22 0701 - 05/10/22 1516  Closed System Drain Right RLQ  14 Fr.   3250 600     Interval imaging/drain manipulation:  None  Current  examination: Flushes/aspirates easily.  Insertion site unremarkable. Suture and stat lock in place. Dressed appropriately.   Plan: Continue TID flushes with 5 cc NS. Record output Q shift. Dressing changes QD or PRN if soiled.  Call IR APP or on call IR MD if difficulty flushing or sudden change in drain output.  Repeat imaging/possible drain injection once output < 10 mL/QD (excluding flush material.)   Splenic artery pseudoaneurysm S/p coil embolization.  HgB drift to 12.1 Procedure site intact, clean. No bruising.  Remains asymptomatic.  Electronically Signed: Docia Barrier, PA 05/10/2022, 3:10 PM   I spent a total of 15 Minutes at the the patient's bedside AND on the patient's hospital floor or unit, greater than 50% of which was counseling/coordinating care for intra-abdominal fluid collection, splenic artery pseudoaneurysm.

## 2022-05-10 NOTE — Progress Notes (Signed)
General Surgery Follow Up Note  Subjective:    Overnight Issues:   Objective:  Vital signs for last 24 hours: Temp:  [97.3 F (36.3 C)-97.8 F (36.6 C)] 97.8 F (36.6 C) (05/27 0547) Pulse Rate:  [76-90] 76 (05/27 0547) Resp:  [12-21] 19 (05/27 0547) BP: (104-125)/(70-89) 107/82 (05/27 0547) SpO2:  [96 %-100 %] 97 % (05/27 0547)  Hemodynamic parameters for last 24 hours:    Intake/Output from previous day: 05/26 0701 - 05/27 0700 In: 415.1 [I.V.:315.1; IV Piggyback:100] Out: 4450 [Urine:600; Drains:3850]  Intake/Output this shift: No intake/output data recorded.  Vent settings for last 24 hours:    Physical Exam:  Gen: comfortable, no distress Neuro: non-focal exam HEENT: PERRL Neck: supple CV: RRR Pulm: unlabored breathing Abd: soft, NT, incision c/d/I , JP purulent GU: retention s/p I/O x2 Extr: wwp, no edema   Results for orders placed or performed during the hospital encounter of 05/08/22 (from the past 24 hour(s))  Glucose, capillary     Status: None   Collection Time: 05/09/22 10:14 AM  Result Value Ref Range   Glucose-Capillary 93 70 - 99 mg/dL  Glucose, capillary     Status: None   Collection Time: 05/09/22 11:17 AM  Result Value Ref Range   Glucose-Capillary 96 70 - 99 mg/dL  Protime-INR     Status: Abnormal   Collection Time: 05/09/22  1:25 PM  Result Value Ref Range   Prothrombin Time 18.6 (H) 11.4 - 15.2 seconds   INR 1.6 (H) 0.8 - 1.2  Aerobic/Anaerobic Culture w Gram Stain (surgical/deep wound)     Status: None (Preliminary result)   Collection Time: 05/09/22  5:38 PM   Specimen: Abscess  Result Value Ref Range   Specimen Description ABSCESS    Special Requests NONE    Gram Stain      ABUNDANT WBC PRESENT,BOTH PMN AND MONONUCLEAR FEW GRAM POSITIVE COCCI IN CLUSTERS FEW GRAM POSITIVE COCCI IN CHAINS RARE GRAM NEGATIVE RODS Performed at [facility], [place] [address] [place], [place] [address]    Culture PENDING    Report  Status PENDING   Glucose, capillary     Status: None   Collection Time: 05/09/22  6:37 PM  Result Value Ref Range   Glucose-Capillary 76 70 - 99 mg/dL  Glucose, capillary     Status: Abnormal   Collection Time: 05/09/22  9:49 PM  Result Value Ref Range   Glucose-Capillary 113 (H) 70 - 99 mg/dL  Comprehensive metabolic panel     Status: Abnormal   Collection Time: 05/10/22  3:15 AM  Result Value Ref Range   Sodium 128 (L) 135 - 145 mmol/L   Potassium 5.2 (H) 3.5 - 5.1 mmol/L   Chloride 98 98 - 111 mmol/L   CO2 19 (L) 22 - 32 mmol/L   Glucose, Bld 119 (H) 70 - 99 mg/dL   BUN 22 8 - 23 mg/dL   Creatinine, Ser 0.86 0.61 - 1.24 mg/dL   Calcium 7.8 (L) 8.9 - 10.3 mg/dL   Total Protein 5.7 (L) 6.5 - 8.1 g/dL   Albumin 1.6 (L) 3.5 - 5.0 g/dL   AST 56 (H) 15 - 41 U/L   ALT 43 0 - 44 U/L   Alkaline Phosphatase 114 38 - 126 U/L   Total Bilirubin 0.7 0.3 - 1.2 mg/dL   GFR, Estimated >60 >60 mL/min   Anion gap 11 5 - 15  CBC     Status: Abnormal   Collection  Time: 05/10/22  3:15 AM  Result Value Ref Range   WBC 28.8 (H) 4.0 - 10.5 K/uL   RBC 4.83 4.22 - 5.81 MIL/uL   Hemoglobin 12.1 (L) 13.0 - 17.0 g/dL   HCT 39.8 39.0 - 52.0 %   MCV 82.4 80.0 - 100.0 fL   MCH 25.1 (L) 26.0 - 34.0 pg   MCHC 30.4 30.0 - 36.0 g/dL   RDW 24.0 (H) 11.5 - 15.5 %   Platelets 407 (H) 150 - 400 K/uL   nRBC 0.0 0.0 - 0.2 %  Magnesium     Status: None   Collection Time: 05/10/22  3:15 AM  Result Value Ref Range   Magnesium 2.1 1.7 - 2.4 mg/dL  Phosphorus     Status: None   Collection Time: 05/10/22  3:15 AM  Result Value Ref Range   Phosphorus 4.3 2.5 - 4.6 mg/dL  Potassium     Status: None   Collection Time: 05/10/22  6:36 AM  Result Value Ref Range   Potassium 5.1 3.5 - 5.1 mmol/L  Glucose, capillary     Status: Abnormal   Collection Time: 05/10/22  7:58 AM  Result Value Ref Range   Glucose-Capillary 116 (H) 70 - 99 mg/dL    Assessment & Plan:  Present on Admission:  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    POD#22, s/p lap assisted R colectomy for adenocarcinoma, Dr. 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 Has seen Dr. Burr Medico - discharged 5/12 and readmitted 5/25 - CT with large post-op abscess - IR drainage 5/26 with 2.7L out initially, 3.8L documented for the last 24h, S&S pending  - also concern for splenic artery PSA on CT, CTA this AM with concern for dissection at bifurcation of celiac as well as some extravasation from distal branch of splenic artery - ?IR embo - urinary retention: bladder scan and foley, likely a clinical cystitis from IAA, as abscess improves this should also   FEN - soft diet as tolerated  VTE - Eliquis and plavix on hold   ID - Zosyn 5/25>>   - Per TRH/Cards -  CAD with history of PCI to LAD Recurrent ventricular tachycardia status post AICD placement, three episodes last PM as long as 80m Cardiology was notified, expected given underlying illness and tolerating episodes.  Ischemic cardiomyopathy CHF, systolic and diastolic, LV EF 35 to 497%Atrial fibrillation    AJesusita Oka MD Trauma & General Surgery Please use [email or web address] to contact on call provider  05/10/2022  *Care during the described time interval was provided by me. I have reviewed this patient's available data, including medical history, events of note, physical examination and test results as part of my evaluation.

## 2022-05-10 NOTE — Progress Notes (Signed)
   Called by nurse for sustained VT,  Dr. Dwyane Dee in room.  Pt stable no pain, SOB weakness.  He converted on own, I do not see pacing out.  Dr. Lovena Le aware, will give 150 mg bolus and increase drip back to 60.  Cecilie Kicks, FNP-C [facility]  [phone] or after 5pm and on weekends call [phone] 05/10/2022.now

## 2022-05-10 NOTE — Progress Notes (Signed)
Pt had runs of VTach for 7 mins, 13 mins and 42mns with a HR in 70's-130s. Pt is asymptomatic. Dr. KHumphrey Rolls(cardiology on call) and Dr. KHal Hope([facility] call) were notified. No new orders given. Will continue to monitor pt.

## 2022-05-10 NOTE — Progress Notes (Signed)
Pt had runs of VTach approximately 21mns and converted to NSR.Pt was asymptomatic. [facility]. [facility]Hal Hope[facility] and Dr. KHumphrey Rolls( cardiology on call) were notified. No new orders given. Will continue to monitor pt

## 2022-05-10 NOTE — Progress Notes (Signed)
Patient noted to be in sustained VT.  Patient alert and comfortable.  Cecilie Kicks NP at nurses station, asked her for assistance.  New orders received.

## 2022-05-10 NOTE — Progress Notes (Signed)
PROGRESS NOTE    Gregory [name]  DZH:[id number] DOB: [birth year] DOA: 05/08/2022 PCP: Patient, [name]   Brief Narrative:  61 years old male with PMH significant for CAD s/p stenting, chronic systolic heart failure, recurrent VT with ICD placement, atrial fibrillation, diabetes mellitus type 2, history of seizures, hypertension presented in the ED after his AICD gave him a shock.  He felt his heart rate was elevated and then he felt like a shock so called 911.  Patient denies any chest pain or shortness of breath.  He does have history of recurrent VTE.  Patient had recent abdominal surgery , states abdominal pain has not improved.  Patient denies any discharge from his incision. He was recently hospitalized from 4/28 - 5/12 for symptomatic anemia. 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  Metastatic involving lymph nodes.  Patient underwent hand-assisted laparoscopic right colectomy on 04/18/2022 so far has received 7 units of PRBCs.  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  IR is consulted.  Patient underwent percutaneous drain and embolization of splenic artery for pseudoaneurysm.  Patient tolerated procedure well.  Patient started on IV antibiotics.  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  Active Problems:   Ileus following gastrointestinal surgery (HCC)   Hypothyroidism   Leukocytosis   Cancer of right colon (HCC)   Chronic combined systolic and diastolic CHF (congestive heart failure) (HCC)/ischemic cardiomyopathy   CAD S/P percutaneous coronary angioplasty   Paroxysmal atrial fibrillation (HCC)   Controlled type 2  diabetes mellitus without complication, without long-term current use of insulin (HCC)   Essential hypertension   Seizures (HCC)   OSA on CPAP   Recurrent VT/  s/p AICD shock: Patient with history of recurrent VT reports AICD firing, admitted after shock from device.. Patient denies any chest pain.  EKG shows normal sinus rhythm. EP physician consulted.  Patient is started on amiodarone gtt. -TSH abnormal Cardiologist stated patient will continue to have episodes of V. tach until primary infection resolves. Continue amiodarone infusion.   Ileus status post gastric surgery Intra-abdominal abscess. S/p hand-assisted laparoscopic right colectomy on 04/18/22. Had post operative ileus during last hospital stay. Now presenting with abdominal pain, last BM 2 days ago and xray finding of ileus General surgery has been consulted. 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 Holding eliquis/plavix until IR intervention. Adequate pain control with oxycodone and morphine as needed for breakthrough pain General surgery discussed the case with IR for evaluation. Patient need ultrasound-guided drainage of abscess and possible embolization of the splenic artery pseudoaneurysm. Patient underwent successful percutaneous drain, and embolization of the splenic artery. Continue IV Zosyn.  Leukocytosis: Likely secondary to intra-abdominal abscess. Lactic acid 1.7.  WBC trending up. Continue IV Zosyn.  Continue to trend WBC   Hypothyroidism Could be sick thyroid or due to amiodarone. Free T4 wnl Would repeat levels in 1-2 weeks.   Cancer of right colon [facility]) Recently diagnosed at previous hospitalization. 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  He has follow up with oncology where PET scan is  planned and discussed treatment options.  Chronic combined systolic and diastolic CHF: Echo 6/78/9381: LVEF 35-40%, worse compared to 09/22/2020 (LVEF 40-45%).  Clinically euvolemic.  Continue farxiga Metoprolol XL and Entresto is on hold with soft bp/hypotension.   Paroxysmal atrial fibrillation (HCC) Patient remains in normal sinus rhythm now. Continue amiodarone gtt. Eliquis is on hold due to intervention.  CAD S/P percutaneous coronary angioplasty S/p LHC on 04/17/22 recommendations:  Anatomy is similar to prior with the following exceptions: 50% to 70% stenosis proximal to the large first obtuse marginal.  40 to 50% stenosis proximal to the stent segment in the first diagonal. He denies any chest pain, troponin mildly elevated. Continue medical management.  Will resume Plavix once cleared from IR.   Type 2 diabetes without complication. Ruben A1c 5.1.  Well-controlled. Continue farxiga for CHF Start SSI and accuchecks per protocol. Sensitive while NPO     Essential hypertension Soft bp, systolic <017.  Has not had any medication in 1-2 days Hold entrestro, toprol-xl and lasix    Seizures (HCC) Continue keppra Seizure precautions    OSA on CPAP Uses cpap at night  Continue qhs    DVT prophylaxis: TED hose Code Status: Full code. Family Communication:No family at bed side. Disposition Plan:    Status is: Inpatient Remains inpatient appropriate because: Admitted for AICD shock found to have intra-abdominal abscess and pseudoaneurysm of splenic artery .  Patient underwent percutaneous drain and embolization of the splenic artery.    Consultants:  General surgery, IR,  Procedures: CT abdomen pelvis Antimicrobials:  Anti-infectives (From admission, onward)    Start     Dose/Rate Route Frequency Ordered Stop   05/08/22 2200  piperacillin-tazobactam (ZOSYN) IVPB 3.375 g        3.375 g 12.5 mL/hr over 240 Minutes Intravenous Every 8 hours 05/08/22 2008          Subjective: Patient was seen and examined at bedside.  Overnight events noted.   Patient reports abdominal pain is improving , he underwent percutaneous drain and embolization of splenic artery, tolerated well. He denies any nausea and vomiting.    Objective: Vitals:   05/09/22 1800 05/09/22 2145 05/10/22 0547 05/10/22 1100  BP: 117/84 117/71 107/82 112/78  Pulse: 88 84 76 72  Resp: '20 18 19 18  '$ Temp:  (!) 97.3 F (36.3 C) 97.8 F (36.6 C) 97.8 F (36.6 C)  TempSrc:  Oral Oral Oral  SpO2: 100% 97% 97% 97%  Weight:      Height:        Intake/Output Summary (Last 24 hours) at 05/10/2022 1340 Last data filed at 05/10/2022 1250 Gross per 24 hour  Intake 595.1 ml  Output 5175 ml  Net -4579.9 ml   Filed Weights   05/08/22 0841 05/08/22 1626  Weight: 95.7 kg 102.1 kg    Examination:  General exam: Appears comfortable, deconditioned, chronically ill looking. Respiratory system: CTA bilaterally, normal respiratory effort.  RR 15 Cardiovascular system: S1 & S2 heard, irregular rhythm, no murmur. Gastrointestinal system: Abdomen is soft, distended, mildly tender, BS+, PCT drain noted Central nervous system: Alert and oriented X 3 . No focal neurological deficits. Extremities: Edema+,, no cyanosis, no clubbing. Skin: No rashes, lesions or ulcers Psychiatry: Judgement and insight appear normal. Mood & affect appropriate.     Data Reviewed: I have personally reviewed following labs and imaging studies  CBC: Recent Labs  Lab 05/05/22 1628 05/08/22 0918 05/09/22 0230 05/10/22 0315  WBC 7.0 13.2* 17.9* 28.8*  NEUTROABS 5.4 11.8*  --   --  HGB 12.7* 14.3 13.5 12.1*  HCT 41.6  43.4 48.5 46.1 39.8  MCV 81.3 86.1 82.8 82.4  PLT 599* 473* 438* 109*   Basic Metabolic Panel: Recent Labs  Lab 05/08/22 0918 05/09/22 0615 05/10/22 0315 05/10/22 0636  NA 135 130* 128*  --   K 5.0 4.8 5.2* 5.1  CL 104 100 98  --   CO2 16* 22 19*  --   GLUCOSE 115* 95 119*  --   BUN 25*  25* 22  --   CREATININE 1.14 0.96 0.86  --   CALCIUM 8.3* 7.9* 7.8*  --   MG 2.6* 2.2 2.1  --   PHOS  --   --  4.3  --    GFR: Estimated Creatinine Clearance: 113.7 mL/min (by C-G formula based on SCr of 0.86 mg/dL). Liver Function Tests: Recent Labs  Lab 05/10/22 0315  AST 56*  ALT 43  ALKPHOS 114  BILITOT 0.7  PROT 5.7*  ALBUMIN 1.6*   No results for input(s): LIPASE, AMYLASE in the last 168 hours. No results for input(s): AMMONIA in the last 168 hours. Coagulation Profile: Recent Labs  Lab 05/08/22 1637 05/09/22 1325  INR 1.7* 1.6*   Cardiac Enzymes: No results for input(s): CKTOTAL, CKMB, CKMBINDEX, TROPONINI in the last 168 hours. BNP (last 3 results) No results for input(s): PROBNP in the last 8760 hours. HbA1C: Recent Labs    05/08/22 1637  HGBA1C 5.1   CBG: Recent Labs  Lab 05/09/22 1117 05/09/22 1837 05/09/22 2149 05/10/22 0758 05/10/22 1209  GLUCAP 96 76 113* 116* 177*   Lipid Profile: No results for input(s): CHOL, HDL, LDLCALC, TRIG, CHOLHDL, LDLDIRECT in the last 72 hours. Thyroid Function Tests: Recent Labs    05/08/22 0918  TSH 11.024*  FREET4 0.86   Anemia Panel: No results for input(s): VITAMINB12, FOLATE, FERRITIN, TIBC, IRON, RETICCTPCT in the last 72 hours. Sepsis Labs: Recent Labs  Lab 05/08/22 1637 05/08/22 2017  LATICACIDVEN 1.9 1.3    Recent Results (from the past 240 hour(s))  Aerobic/Anaerobic Culture w Gram Stain (surgical/deep wound)     Status: None (Preliminary result)   Collection Time: 05/09/22  5:38 PM   Specimen: Abscess  Result Value Ref Range Status   Specimen Description ABSCESS  Final   Special Requests NONE  Final   Gram Stain   Final    ABUNDANT WBC PRESENT,BOTH PMN AND MONONUCLEAR FEW GRAM POSITIVE COCCI IN CLUSTERS FEW GRAM POSITIVE COCCI IN CHAINS RARE GRAM NEGATIVE RODS    Culture   Final    MODERATE GRAM NEGATIVE RODS IDENTIFICATION AND SUSCEPTIBILITIES TO FOLLOW Performed at [facility], [place] [address] [place], [place] [address]    Report Status PENDING  Incomplete     Radiology Studies: CT ANGIO ABDOMEN W &/OR WO CONTRAST  Result Date: 05/09/2022 CLINICAL DATA:  Splenic artery dissection. EXAM: CT ANGIOGRAPHY ABDOMEN TECHNIQUE: Multidetector CT imaging of the abdomen was performed using the standard protocol during bolus administration of intravenous contrast. Multiplanar reconstructed images and MIPs were obtained and reviewed to evaluate the vascular anatomy. RADIATION DOSE REDUCTION: This exam was performed according to the departmental dose-optimization program which includes automated exposure control, adjustment of the mA and/or kV according to patient size and/or use of iterative reconstruction technique. CONTRAST:  71m OMNIPAQUE IOHEXOL 350 MG/ML SOLN COMPARISON:  CT of May 08, 2022. FINDINGS: VASCULAR Aorta: Atherosclerosis of abdominal aorta is noted without dissection. 3 cm fusiform infrarenal abdominal aortic aneurysm is noted.  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 No thrombus is noted. 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 The common hepatic, left gastric and splenic arteries appear to be patent. There does appear to be some extravasation of contrast arising from a distal branch of the splenic artery in the region of the splenic hilum, best seen on image number 33 of series 5. 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 SMA: Patent without evidence of aneurysm, dissection, vasculitis or significant stenosis. Renals: Both renal arteries are patent without evidence of aneurysm, dissection, vasculitis, fibromuscular dysplasia or significant stenosis. IMA: Patent without evidence of aneurysm, dissection, vasculitis or significant stenosis. Inflow: Patent without evidence of aneurysm, dissection, vasculitis or significant  stenosis. Veins: No obvious venous abnormality within the limitations of this arterial phase study. Review of the MIP images confirms the above findings. NON-VASCULAR Lower chest: Bilateral pleural effusions are noted with adjacent subsegmental atelectasis. Hepatobiliary: Gallstone is noted. Dilated gallbladder is noted. No biliary dilatation is noted. Liver is otherwise unremarkable. Pancreas: There is again noted peripancreatic fluid collections including 10.6 x 4.4 cm bilobed abnormality which appears to contain a pseudoaneurysm as described above. Spleen: Normal in size. No definite splenic parenchymal abnormality is noted. Adrenals/Urinary Tract: Bilateral adrenal nodules are again noted. No hydronephrosis or renal obstruction is noted. Stomach/Bowel: Stomach is unremarkable. No definite evidence of bowel obstruction is noted. Lymphatic: No definite adenopathy is noted. Other: There is again noted a large complex fluid collection involving most of the right-sided the abdomen that measures 25 x 15 cm. Multiple air-fluid levels are noted within it suggesting internal loculations. Musculoskeletal: No acute or significant osseous findings. 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 There is also noted small amount of contrast extravasation involving a distal branch of the splenic artery which is seen supplying pseudoaneurysm in the splenic hilum, suggesting arterial injury. These results will be called to the ordering clinician or representative by the Radiologist Assistant, and communication documented in the PACS or zVision Dashboard. 3 cm infrarenal abdominal aortic aneurysm is noted. Recommend follow-up ultrasound every 3 years. This recommendation follows ACR consensus guidelines: White Paper of the ACR Incidental Findings Committee II on Vascular Findings. J Am Coll Radiol 2013; 10:789-794. NON-VASCULAR Continued  presence of large complex fluid collection in the right-sided the abdomen measuring 25 x 15 cm. This is concerning for abscess. Multiple air-fluid levels are noted within it suggesting internal loculations. Continued presence of peripancreatic fluid collections including 10.6 x 4.4 cm bilobed abnormality around the pancreatic tail which contains pseudoaneurysm described above. Bilateral adrenal nodules are again noted. Bilateral pleural effusions are again noted with associated atelectasis. Aortic Atherosclerosis (ICD10-I70.0). Electronically Signed   By: Marijo Conception M.D.   On: 05/09/2022 09:52   CT ABDOMEN PELVIS W CONTRAST  Addendum Date: 05/08/2022   ADDENDUM REPORT: 05/08/2022 19:34 ADDENDUM: These results were called by telephone at the time of interpretation on 05/08/2022 at 7:15 pm to provider Dr Georganna Skeans, who verbally acknowledged these results. Electronically Signed   By: Julian Hy M.D.   On: 05/08/2022 19:34   Result Date: 05/08/2022 CLINICAL DATA:  Abdominal pain, ileus, status post surgery EXAM: CT ABDOMEN AND PELVIS WITH CONTRAST TECHNIQUE: Multidetector CT imaging of the abdomen and pelvis was performed using the standard protocol  following bolus administration of intravenous contrast. RADIATION DOSE REDUCTION: This exam was performed according to the departmental dose-optimization program which includes automated exposure control, adjustment of the mA and/or kV according to patient size and/or use of iterative reconstruction technique. CONTRAST:  139m OMNIPAQUE IOHEXOL 300 MG/ML  SOLN COMPARISON:  Abdominal radiograph dated 05/08/2022. CT abdomen/pelvis dated 04/16/2022. FINDINGS: Lower chest: Moderate left and small right pleural effusions. Bilateral lower lobe atelectasis. Hepatobiliary: Subcentimeter hepatic cysts. Gallbladder is notable for a 6 mm layering gallstone (series 3/image 40). No associated inflammatory changes. No intrahepatic or extrahepatic duct dilatation.  Pancreas: Pancreas is notable for peripancreatic fluid collections, including a dominant 4.9 cm collection along the pancreatic tail (series 3/image 28), previously 4.4 cm. Within this collection, there has been development of a 2.7 cm pseudoaneurysm (series 3/image 25), which is new from the prior. Spleen: Within normal limits. Adrenals/Urinary Tract: Multiple bilateral adrenal nodules, measuring up to 2.0 cm on the left (series 8/image 15), likely reflecting benign adrenal adenomas, unchanged. Multiple bilateral renal cysts, measuring up to 2.7 cm in the medial left upper pole (series 8/image 18), benign. No hydronephrosis. Bladder is within normal limits. Stomach/Bowel: Stomach is notable for a tiny hiatal hernia. Status post right hemicolectomy with appendectomy. No evidence of bowel obstruction. Vascular/Lymphatic: Infrarenal abdominal aorta measures up to 3.0 cm (series 3/image 56). Atherosclerotic calcifications of the abdominal aorta and branch vessels. No suspicious abdominopelvic lymphadenopathy. Reproductive: Prostate is unremarkable. Other: Large fluid in gas collection with enhancing rim in the abdomen/pelvis, measuring at least 14.8 x 28.2 x 20.6 cm, reflecting a postoperative abscess. Additional mild abdominopelvic ascites. No free air. Musculoskeletal: Mild degenerative changes of the visualized thoracolumbar spine. IMPRESSION: Large postoperative abscess in the abdomen/pelvis, measuring at least 14.8 x 28.2 x 20.6 cm. Consider surgical washout versus drainage by interventional radiology, as clinically warranted. 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 Interventional radiology consultation is suggested for embolization. Additional ancillary findings as above. Aortic Atherosclerosis (ICD10-I70.0). Electronically Signed: By: SJulian HyM.D. 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 Ultrasound-guided right abdominal abscess drain placement 2. Ultrasound-guided access of right common femoral artery 3. Celiac angiogram 4. Splenic artery angiogram and distal splenic branch embolization (x2) 5. Angio-Seal closure of right common femoral artery access MEDICATIONS: Patient currently on inpatient regiment of IV Zosyn. ANESTHESIA/SEDATION: Moderate (conscious) sedation was employed during this procedure. A total of Versed 1 mg and Fentanyl 50 mcg was administered intravenously by the radiology nurse. Total intra-service moderate Sedation Time: 160 minutes. The patient's level of consciousness and vital signs were monitored continuously by radiology nursing throughout the procedure under my direct supervision. CONTRAST:  130 mL intra arterial Omnipaque 300 FLUOROSCOPY: Radiation Exposure Index (as provided by the fluoroscopic device): 6784mGy Kerma COMPLICATIONS: None immediate. PROCEDURE: Informed consent was obtained from the patient following explanation of the procedure, risks, benefits and alternatives. The patient understands, agrees and consents for the procedure. All questions were addressed. A time out was performed prior to the initiation of the procedure. Maximal barrier sterile technique utilized including caps, mask, sterile gowns, sterile gloves, large sterile drape, hand hygiene, and chlorhexidine prep. Right  abdominal wall skin prepped and draped in usual fashion. Ultrasound evaluation showed large loculated abscess. Following local lidocaine administration, 19 gauge Yueh needle was advanced into the right abdominal collection using  continuous ultrasound guidance. Yueh catheter removed over 0.035 inch guidewire, serial dilation performed, and 14.0 [place] multipurpose pigtail drain placed. 20 mL purulent sample aspirated and sent for Gram stain and culture. Total of 2700 mL of purulent fluid drained from the collection. Drain secured to skin with suture and connected to bag. Right groin skin prepped and draped usual fashion. Ultrasound image documenting patency of the right common femoral artery was obtained and placed in permanent medical record. Sterile ultrasound probe cover and gel utilized throughout the procedure. Utilizing continuous ultrasound guidance, the right common femoral artery was accessed at the level of the femoral head with a 21 gauge needle. 21 gauge needle exchanged for a transitional dilator set over 0.018 inch guidewire. Transitional dilator set exchanged for 5 French sheath over 0.035 inch guidewire. Sos omni catheter utilized to select the celiac artery. 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 Common hepatic, left gastric, and splenic arteries are patent. 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 Lantern microcatheter was advanced into the splenic artery, however the base catheter was pushed out while trying to advance the microcatheter. Sos omni catheter was exchanged for a C2 catheter. C2 catheter advanced into the splenic artery utilizing Glidewire. Splenic angiogram again showed filling of the pseudoaneurysm. Lantern microcatheter advanced further into the splenic artery and repeat angiogram was performed. There was intermittent filling of the pseudoaneurysm it was very difficult to determine which branch of the splenic artery supplied the pseudoaneurysm. Lantern microcatheter advanced into 1 of the splenic artery branches nearest the pseudoaneurysm and angiogram was performed. Although no direct filling of the pseudoaneurysm was  seen, irregular vessels were identified in this area. This artery was embolized with combination of 4 mm Ruby coils. The next closest branch of the splenic artery was selected and angiogram was performed which showed flow into the pseudoaneurysm. The microcatheter was advanced beyond the origin of the pseudoaneurysm and the artery was embolized with 4 mm Ruby coils. The microcatheter was then retracted and embolization was completed with 4 mm Ruby coils. Microcatheter was removed. Splenic angiogram showed no additional flow into the pseudoaneurysm. Sheath angiogram demonstrated appropriate puncture at the level of the common femoral head. The groin was re-prepped and draped and closure was performed with 6 [place] Angio-Seal device. IMPRESSION: 1. 14.0 French drain placed in right abdominal abscess, removing 2700 mL of purulent fluid. 2. Splenic angiogram shows active extravasation from distal splenic branch, 2 of which were coil embolized. The branch responsible for directly supplying the pseudoaneurysm was successfully embolized distal and proximal to origin of the pseudoaneurysm. Electronically Signed   By: Miachel Roux M.D.   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 Ultrasound-guided right abdominal abscess drain placement 2. Ultrasound-guided access of right common femoral artery 3. Celiac angiogram 4. Splenic artery angiogram and distal splenic branch embolization (x2) 5. Angio-Seal closure of right common femoral artery access MEDICATIONS: Patient currently on inpatient regiment of IV Zosyn. ANESTHESIA/SEDATION:  Moderate (conscious)  sedation was employed during this procedure. A total of Versed 1 mg and Fentanyl 50 mcg was administered intravenously by the radiology nurse. Total intra-service moderate Sedation Time: 160 minutes. The patient's level of consciousness and vital signs were monitored continuously by radiology nursing throughout the procedure under my direct supervision. CONTRAST:  130 mL intra arterial Omnipaque 300 FLUOROSCOPY: Radiation Exposure Index (as provided by the fluoroscopic device): 741 mGy Kerma COMPLICATIONS: None immediate. PROCEDURE: Informed consent was obtained from the patient following explanation of the procedure, risks, benefits and alternatives. The patient understands, agrees and consents for the procedure. All questions were addressed. A time out was performed prior to the initiation of the procedure. Maximal barrier sterile technique utilized including caps, mask, sterile gowns, sterile gloves, large sterile drape, hand hygiene, and chlorhexidine prep. Right abdominal wall skin prepped and draped in usual fashion. Ultrasound evaluation showed large loculated abscess. Following local lidocaine administration, 19 gauge Yueh needle was advanced into the right abdominal collection using continuous ultrasound guidance. Yueh catheter removed over 0.035 inch guidewire, serial dilation performed, and 14.0 [place] multipurpose pigtail drain placed. 20 mL purulent sample aspirated and sent for Gram stain and culture. Total of 2700 mL of purulent fluid drained from the collection. Drain secured to skin with suture and connected to bag. Right groin skin prepped and draped usual fashion. Ultrasound image documenting patency of the right common femoral artery was obtained and placed in permanent medical record. Sterile ultrasound probe cover and gel utilized throughout the procedure. Utilizing continuous ultrasound guidance, the right common femoral artery was accessed at the level of the femoral head with a 21 gauge  needle. 21 gauge needle exchanged for a transitional dilator set over 0.018 inch guidewire. Transitional dilator set exchanged for 5 French sheath over 0.035 inch guidewire. Sos omni catheter utilized to select the celiac artery. 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 Common hepatic, left gastric, and splenic arteries are patent. 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 Lantern microcatheter was advanced into the splenic artery, however the base catheter was pushed out while trying to advance the microcatheter. Sos omni catheter was exchanged for a C2 catheter. C2 catheter advanced into the splenic artery utilizing Glidewire. Splenic angiogram again showed filling of the pseudoaneurysm. Lantern microcatheter advanced further into the splenic artery and repeat angiogram was performed. There was intermittent filling of the pseudoaneurysm it was very difficult to determine which branch of the splenic artery supplied the pseudoaneurysm. Lantern microcatheter advanced into 1 of the splenic artery branches nearest the pseudoaneurysm and angiogram was performed. Although no direct filling of the pseudoaneurysm was seen, irregular vessels were identified in this area. This artery was embolized with combination of 4 mm Ruby coils. The next closest branch of the splenic artery was selected and angiogram was performed which showed flow into the pseudoaneurysm. The microcatheter was advanced beyond the origin of the pseudoaneurysm and the artery was embolized with 4 mm Ruby coils. The microcatheter was then retracted and embolization was completed with 4 mm Ruby coils. Microcatheter was removed. Splenic angiogram showed no additional flow into the pseudoaneurysm. Sheath angiogram demonstrated appropriate puncture at the level of the common femoral head. The groin was re-prepped and draped and closure was performed with 6 [place] Angio-Seal  device. IMPRESSION: 1. 14.0 French drain placed in right abdominal abscess, removing 2700 mL of purulent fluid. 2. Splenic angiogram shows active extravasation from distal splenic branch, 2  of which were coil embolized. The branch responsible for directly supplying the pseudoaneurysm was successfully embolized distal and proximal to origin of the pseudoaneurysm. Electronically Signed   By: Miachel Roux M.D.   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 Ultrasound-guided right abdominal abscess drain placement 2. Ultrasound-guided access of right common femoral artery 3. Celiac angiogram 4. Splenic artery angiogram and distal splenic branch embolization (x2) 5. Angio-Seal closure of right common femoral artery access MEDICATIONS: Patient currently on inpatient regiment of IV Zosyn. ANESTHESIA/SEDATION: Moderate (conscious) sedation was employed during this procedure. A total of Versed 1 mg and Fentanyl 50 mcg was administered intravenously by the radiology nurse. Total intra-service moderate Sedation Time: 160 minutes. The patient's level of consciousness and vital signs were monitored continuously by radiology nursing throughout the procedure under my direct supervision. CONTRAST:  130 mL intra arterial Omnipaque 300 FLUOROSCOPY: Radiation Exposure Index (as provided by the fluoroscopic device): 732 mGy Kerma COMPLICATIONS: None immediate. PROCEDURE: Informed consent was obtained from the patient following explanation of the procedure, risks, benefits and alternatives. The patient understands, agrees and consents for the procedure. All questions were addressed.  A time out was performed prior to the initiation of the procedure. Maximal barrier sterile technique utilized including caps, mask, sterile gowns, sterile gloves, large sterile drape, hand hygiene, and chlorhexidine prep. Right abdominal wall skin prepped and draped in usual fashion. Ultrasound evaluation showed large loculated abscess. Following local lidocaine administration, 19 gauge Yueh needle was advanced into the right abdominal collection using continuous ultrasound guidance. Yueh catheter removed over 0.035 inch guidewire, serial dilation performed, and 14.0 [place] multipurpose pigtail drain placed. 20 mL purulent sample aspirated and sent for Gram stain and culture. Total of 2700 mL of purulent fluid drained from the collection. Drain secured to skin with suture and connected to bag. Right groin skin prepped and draped usual fashion. Ultrasound image documenting patency of the right common femoral artery was obtained and placed in permanent medical record. Sterile ultrasound probe cover and gel utilized throughout the procedure. Utilizing continuous ultrasound guidance, the right common femoral artery was accessed at the level of the femoral head with a 21 gauge needle. 21 gauge needle exchanged for a transitional dilator set over 0.018 inch guidewire. Transitional dilator set exchanged for 5 French sheath over 0.035 inch guidewire. Sos omni catheter utilized to select the celiac artery. 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 Common hepatic, left gastric, and splenic arteries are patent. 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 Lantern microcatheter was advanced into the splenic artery, however the base catheter was pushed out while trying to advance the microcatheter. Sos omni catheter was exchanged for a C2 catheter. C2 catheter advanced into the splenic artery utilizing Glidewire. Splenic angiogram again showed  filling of the pseudoaneurysm. Lantern microcatheter advanced further into the splenic artery and repeat angiogram was performed. There was intermittent filling of the pseudoaneurysm it was very difficult to determine which branch of the splenic artery supplied the pseudoaneurysm. Lantern microcatheter advanced into 1 of the splenic artery branches nearest the pseudoaneurysm and angiogram was performed. Although no direct filling of the pseudoaneurysm was  seen, irregular vessels were identified in this area. This artery was embolized with combination of 4 mm Ruby coils. The next closest branch of the splenic artery was selected and angiogram was performed which showed flow into the pseudoaneurysm. The microcatheter was advanced beyond the origin of the pseudoaneurysm and the artery was embolized with 4 mm Ruby coils. The microcatheter was then retracted and embolization was completed with 4 mm Ruby coils. Microcatheter was removed. Splenic angiogram showed no additional flow into the pseudoaneurysm. Sheath angiogram demonstrated appropriate puncture at the level of the common femoral head. The groin was re-prepped and draped and closure was performed with 6 [place] Angio-Seal device. IMPRESSION: 1. 14.0 French drain placed in right abdominal abscess, removing 2700 mL of purulent fluid. 2. Splenic angiogram shows active extravasation from distal splenic branch, 2 of which were coil embolized. The branch responsible for directly supplying the pseudoaneurysm was successfully embolized distal and proximal to origin of the pseudoaneurysm. Electronically Signed   By: Miachel Roux M.D.   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 Ultrasound-guided right abdominal abscess drain placement 2. Ultrasound-guided access of right common femoral artery 3. Celiac angiogram 4. Splenic artery angiogram and distal splenic branch embolization (x2) 5. Angio-Seal closure of right common femoral artery access MEDICATIONS: Patient currently on inpatient regiment of IV Zosyn. ANESTHESIA/SEDATION: Moderate (conscious) sedation was employed during this procedure. A total of Versed 1 mg and Fentanyl 50 mcg was administered intravenously by the radiology nurse. Total intra-service moderate Sedation Time: 160 minutes. The patient's level of consciousness and vital signs were monitored continuously by radiology nursing throughout the procedure under my direct supervision. CONTRAST:  130 mL intra arterial Omnipaque 300 FLUOROSCOPY: Radiation Exposure Index (as provided by the fluoroscopic device): 903 mGy Kerma COMPLICATIONS: None immediate. PROCEDURE: Informed consent was obtained from the patient following explanation of the procedure, risks, benefits and alternatives. The patient understands, agrees and consents for the procedure. All questions were addressed. A time out was performed prior to the initiation of the procedure. Maximal barrier sterile technique utilized including caps, mask, sterile gowns, sterile gloves, large sterile drape, hand hygiene, and chlorhexidine prep. Right abdominal wall skin prepped and draped in usual fashion. Ultrasound evaluation showed large loculated abscess. Following local lidocaine administration, 19 gauge Yueh needle was advanced into the right abdominal collection using continuous ultrasound guidance. Yueh catheter removed over 0.035 inch guidewire, serial dilation performed, and 14.0 [place] multipurpose pigtail drain placed. 20 mL purulent sample aspirated and sent for Gram stain and culture. Total of 2700 mL of purulent fluid  drained from the collection. Drain secured to skin with suture and connected to bag. Right groin skin prepped and draped usual fashion. Ultrasound image documenting patency of the right common femoral artery was obtained and placed in permanent medical record. Sterile ultrasound probe cover and gel utilized throughout the procedure. Utilizing continuous ultrasound guidance, the right common femoral artery was accessed at the level of the femoral head with a 21 gauge needle. 21 gauge needle exchanged for a transitional dilator set over 0.018 inch guidewire. Transitional dilator set exchanged for 5 French sheath over 0.035 inch guidewire. Sos omni catheter  utilized to select the celiac artery. 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 Common hepatic, left gastric, and splenic arteries are patent. 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 Lantern microcatheter was advanced into the splenic artery, however the base catheter was pushed out while trying to advance the microcatheter. Sos omni catheter was exchanged for a C2 catheter. C2 catheter advanced into the splenic artery utilizing Glidewire. Splenic angiogram again showed filling of the pseudoaneurysm. Lantern microcatheter advanced further into the splenic artery and repeat angiogram was performed. There was intermittent filling of the pseudoaneurysm it was very difficult to determine which branch of the splenic artery supplied the pseudoaneurysm. Lantern microcatheter advanced into 1 of the splenic artery branches nearest the pseudoaneurysm and angiogram was performed. Although no direct filling of the pseudoaneurysm was seen, irregular vessels were identified in this area. This artery was embolized with combination of 4 mm Ruby coils. The next closest branch of the splenic artery was selected and angiogram was performed which showed flow into the pseudoaneurysm. The microcatheter  was advanced beyond the origin of the pseudoaneurysm and the artery was embolized with 4 mm Ruby coils. The microcatheter was then retracted and embolization was completed with 4 mm Ruby coils. Microcatheter was removed. Splenic angiogram showed no additional flow into the pseudoaneurysm. Sheath angiogram demonstrated appropriate puncture at the level of the common femoral head. The groin was re-prepped and draped and closure was performed with 6 [place] Angio-Seal device. IMPRESSION: 1. 14.0 French drain placed in right abdominal abscess, removing 2700 mL of purulent fluid. 2. Splenic angiogram shows active extravasation from distal splenic branch, 2 of which were coil embolized. The branch responsible for directly supplying the pseudoaneurysm was successfully embolized distal and proximal to origin of the pseudoaneurysm. Electronically Signed   By: Miachel Roux M.D.   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 Ultrasound-guided right abdominal abscess drain placement 2. Ultrasound-guided access of right common femoral artery 3. Celiac angiogram 4. Splenic artery angiogram and distal splenic branch embolization (x2) 5. Angio-Seal closure of right common femoral artery access MEDICATIONS: Patient currently on inpatient regiment of IV Zosyn. ANESTHESIA/SEDATION: Moderate (conscious) sedation was employed during this procedure. A total of Versed 1 mg and Fentanyl 50 mcg was administered intravenously by the radiology nurse. Total intra-service moderate Sedation Time: 160 minutes. The patient's level of consciousness and vital signs  were monitored continuously by radiology nursing throughout the procedure under my direct supervision. CONTRAST:  130 mL intra arterial Omnipaque 300 FLUOROSCOPY: Radiation Exposure Index (as provided by the fluoroscopic device): 338 mGy Kerma COMPLICATIONS: None immediate. PROCEDURE: Informed consent was obtained from the patient following explanation of the procedure, risks, benefits and alternatives. The patient understands, agrees and consents for the procedure. All questions were addressed. A time out was performed prior to the initiation of the procedure. Maximal barrier sterile technique utilized including caps, mask, sterile gowns, sterile gloves, large sterile drape, hand hygiene, and chlorhexidine prep. Right abdominal wall skin prepped and draped in usual fashion. Ultrasound evaluation showed large loculated abscess. Following local lidocaine administration, 19 gauge Yueh needle was advanced into the right  abdominal collection using continuous ultrasound guidance. Yueh catheter removed over 0.035 inch guidewire, serial dilation performed, and 14.0 [place] multipurpose pigtail drain placed. 20 mL purulent sample aspirated and sent for Gram stain and culture. Total of 2700 mL of purulent fluid drained from the collection. Drain secured to skin with suture and connected to bag. Right groin skin prepped and draped usual fashion. Ultrasound image documenting patency of the right common femoral artery was obtained and placed in permanent medical record. Sterile ultrasound probe cover and gel utilized throughout the procedure. Utilizing continuous ultrasound guidance, the right common femoral artery was accessed at the level of the femoral head with a 21 gauge needle. 21 gauge needle exchanged for a transitional dilator set over 0.018 inch guidewire. Transitional dilator set exchanged for 5 French sheath over 0.035 inch guidewire. Sos omni catheter utilized to select the celiac artery. 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 Common hepatic, left gastric, and splenic arteries are patent. 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 Lantern microcatheter was advanced into the splenic artery, however the base catheter was pushed out while trying to advance the microcatheter. Sos omni catheter was exchanged for a C2 catheter. C2 catheter advanced into the splenic artery utilizing Glidewire. Splenic angiogram again showed filling of the pseudoaneurysm. Lantern microcatheter advanced further into the splenic artery and repeat angiogram was performed. There was intermittent filling of the pseudoaneurysm it was very difficult to determine which branch of the splenic artery supplied the pseudoaneurysm. Lantern microcatheter advanced into 1 of the splenic artery branches nearest the pseudoaneurysm and angiogram was performed. Although no direct filling of the pseudoaneurysm was seen, irregular vessels were identified in this area. This artery was embolized with combination of 4 mm Ruby coils. The next closest branch of the splenic artery was selected and angiogram was performed which showed flow into the pseudoaneurysm. The microcatheter was advanced beyond the origin of the pseudoaneurysm and the artery was embolized with 4 mm Ruby coils. The microcatheter was then retracted and embolization was completed with 4 mm Ruby coils. Microcatheter was removed. Splenic angiogram showed no additional flow into the pseudoaneurysm. Sheath angiogram demonstrated appropriate puncture at the level of the common femoral head. The groin was re-prepped and draped and closure was performed with 6 [place] Angio-Seal device. IMPRESSION: 1. 14.0 French drain placed in right abdominal abscess, removing 2700 mL of purulent fluid. 2. Splenic angiogram shows active extravasation from distal splenic branch, 2 of which were coil embolized. The branch responsible for directly  supplying the pseudoaneurysm was successfully embolized distal and proximal to origin of the pseudoaneurysm. Electronically Signed   By: Miachel Roux M.D.   On: 05/10/2022 11:19    Scheduled Meds:  atorvastatin  80 mg Oral QPM   dapagliflozin propanediol  10 mg Oral Daily   ezetimibe  10 mg Oral Daily   insulin aspart  0-9 Units Subcutaneous TID WC   levETIRAcetam  500 mg Oral BID   pantoprazole  40 mg Oral Daily   sodium chloride flush  3 mL Intravenous Q12H   sodium chloride flush  5 mL Intracatheter Q8H   Continuous Infusions:  sodium chloride     amiodarone 30 mg/hr (05/10/22 0559)   amiodarone 60 mg/hr (05/10/22 1154)   piperacillin-tazobactam (ZOSYN)  IV 3.375 g (05/10/22 0930)     LOS: 2 days    Time spent: 35 mins    Shawna Clamp, MD [facility]  If 7PM-7AM, please contact night-coverage  

## 2022-05-11 DIAGNOSIS — I472 Ventricular tachycardia, unspecified: Secondary | ICD-10-CM | POA: Diagnosis not present

## 2022-05-11 LAB — GLUCOSE, CAPILLARY
Glucose-Capillary: 115 mg/dL — ABNORMAL HIGH (ref 70–99)
Glucose-Capillary: 123 mg/dL — ABNORMAL HIGH (ref 70–99)
Glucose-Capillary: 223 mg/dL — ABNORMAL HIGH (ref 70–99)
Glucose-Capillary: 109 mg/dL — ABNORMAL HIGH (ref 70–99)

## 2022-05-11 LAB — COMPREHENSIVE METABOLIC PANEL
ALT: 44 U/L (ref 0–44)
AST: 70 U/L — ABNORMAL HIGH (ref 15–41)
Albumin: <1.5 g/dL — ABNORMAL LOW (ref 3.5–5.0)
Alkaline Phosphatase: 156 U/L — ABNORMAL HIGH (ref 38–126)
Anion gap: 8 (ref 5–15)
BUN: 15 mg/dL (ref 8–23)
CO2: 22 mmol/L (ref 22–32)
Calcium: 7.7 mg/dL — ABNORMAL LOW (ref 8.9–10.3)
Chloride: 99 mmol/L (ref 98–111)
Creatinine, Ser: 0.73 mg/dL (ref 0.61–1.24)
GFR, Estimated: >60 mL/min (ref >60)
Glucose, Bld: 127 mg/dL — ABNORMAL HIGH (ref 70–99)
Potassium: 4.7 mmol/L (ref 3.5–5.1)
Sodium: 129 mmol/L — ABNORMAL LOW (ref 135–145)
Total Bilirubin: 0.5 mg/dL (ref 0.3–1.2)
Total Protein: 5.2 g/dL — ABNORMAL LOW (ref 6.5–8.1)

## 2022-05-11 LAB — PHOSPHORUS: Phosphorus: 2.7 mg/dL (ref 2.5–4.6)

## 2022-05-11 LAB — CBC
HCT: 34.5 % — ABNORMAL LOW (ref 39.0–52.0)
Hemoglobin: 10.7 g/dL — ABNORMAL LOW (ref 13.0–17.0)
MCH: 25.5 pg — ABNORMAL LOW (ref 26.0–34.0)
MCHC: 31 g/dL (ref 30.0–36.0)
MCV: 82.3 fL (ref 80.0–100.0)
Platelets: 337 10*3/uL (ref 150–400)
RBC: 4.19 MIL/uL — ABNORMAL LOW (ref 4.22–5.81)
RDW: 22.5 % — ABNORMAL HIGH (ref 11.5–15.5)
WBC: 28.6 10*3/uL — ABNORMAL HIGH (ref 4.0–10.5)
nRBC: 0 % (ref 0.0–0.2)

## 2022-05-11 LAB — MAGNESIUM: Magnesium: 2.1 mg/dL (ref 1.7–2.4)

## 2022-05-11 MED ORDER — DOCUSATE SODIUM 100 MG PO CAPS
100.0000 mg | ORAL_CAPSULE | Freq: Two times a day (BID) | ORAL | Status: DC
  Administered 2022-05-11 – 2022-05-22 (×17): 100 mg via ORAL
  Filled 2022-05-11 (×21): qty 1

## 2022-05-11 MED ORDER — APIXABAN 5 MG PO TABS
5.0000 mg | ORAL_TABLET | Freq: Two times a day (BID) | ORAL | Status: DC
  Administered 2022-05-11 – 2022-05-13 (×4): 5 mg via ORAL
  Filled 2022-05-11 (×5): qty 1

## 2022-05-11 MED ORDER — PANTOPRAZOLE SODIUM 40 MG PO TBEC
40.0000 mg | DELAYED_RELEASE_TABLET | Freq: Two times a day (BID) | ORAL | Status: DC
  Administered 2022-05-11 – 2022-05-22 (×22): 40 mg via ORAL
  Filled 2022-05-11 (×22): qty 1

## 2022-05-11 MED ORDER — POLYETHYLENE GLYCOL 3350 17 G PO PACK
17.0000 g | PACK | Freq: Every day | ORAL | Status: DC
  Administered 2022-05-11 – 2022-05-15 (×5): 17 g via ORAL
  Filled 2022-05-11 (×5): qty 1

## 2022-05-11 MED ORDER — PANTOPRAZOLE SODIUM 40 MG PO TBEC: 40.0000 mg | DELAYED_RELEASE_TABLET | Freq: Two times a day (BID) | ORAL | Status: DC

## 2022-05-11 MED ORDER — BISACODYL 10 MG RE SUPP
10.0000 mg | Freq: Once | RECTAL | Status: AC
  Administered 2022-05-11: 10 mg via RECTAL
  Filled 2022-05-11: qty 1

## 2022-05-11 NOTE — Progress Notes (Signed)
Subjective/Chief Complaint: Having flatus, no bm   Objective: Vital signs in last 24 hours: Temp:  [97.6 F (36.4 C)-97.9 F (36.6 C)] 97.6 F (36.4 C) (05/28 0434) Pulse Rate:  [71-75] 75 (05/28 0434) Resp:  [18-19] 18 (05/28 0434) BP: (112-120)/(73-79) 119/73 (05/28 0434) SpO2:  [95 %-99 %] 99 % (05/28 0434) Last BM Date : 05/06/22  Intake/Output from previous day: 05/27 0701 - 05/28 0700 In: 1323 [P.O.:180; I.V.:883; IV Piggyback:250] Out: 1950 [Urine:1500; Drains:450] Intake/Output this shift: No intake/output data recorded.  GI: drain with murky fluid, mild distended approp tender incisoin clean  Lab Results:  Recent Labs    05/10/22 0315 05/11/22 0251  WBC 28.8* 28.6*  HGB 12.1* 10.7*  HCT 39.8 34.5*  PLT 407* 337   BMET Recent Labs    05/10/22 0315 05/10/22 0636 05/11/22 0251  NA 128*  --  129*  K 5.2* 5.1 4.7  CL 98  --  99  CO2 19*  --  22  GLUCOSE 119*  --  127*  BUN 22  --  15  CREATININE 0.86  --  0.73  CALCIUM 7.8*  --  7.7*   PT/INR Recent Labs    05/08/22 1637 05/09/22 1325  LABPROT 19.9* 18.6*  INR 1.7* 1.6*   ABG No results for input(s): PHART, HCO3 in the last 72 hours.  Invalid input(s): PCO2, PO2  Studies/Results: CT ANGIO ABDOMEN W &/OR WO CONTRAST  Result Date: 05/09/2022 CLINICAL DATA:  Splenic artery dissection. EXAM: CT ANGIOGRAPHY ABDOMEN TECHNIQUE: Multidetector CT imaging of the abdomen was performed using the standard protocol during bolus administration of intravenous contrast. Multiplanar reconstructed images and MIPs were obtained and reviewed to evaluate the vascular anatomy. RADIATION DOSE REDUCTION: This exam was performed according to the departmental dose-optimization program which includes automated exposure control, adjustment of the mA and/or kV according to patient size and/or use of iterative reconstruction technique. CONTRAST:  94m OMNIPAQUE IOHEXOL 350 MG/ML SOLN COMPARISON:  CT of May 08, 2022.  FINDINGS: VASCULAR Aorta: Atherosclerosis of abdominal aorta is noted without dissection. 3 cm fusiform infrarenal abdominal aortic aneurysm is noted. 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 No thrombus is noted. 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 The common hepatic, left gastric and splenic arteries appear to be patent. There does appear to be some extravasation of contrast arising from a distal branch of the splenic artery in the region of the splenic hilum, best seen on image number 33 of series 5. 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 SMA: Patent without evidence of aneurysm, dissection, vasculitis or significant stenosis. Renals: Both renal arteries are patent without evidence of aneurysm, dissection, vasculitis, fibromuscular dysplasia or significant stenosis. IMA: Patent without evidence of aneurysm, dissection, vasculitis or significant stenosis. Inflow: Patent without evidence of aneurysm, dissection, vasculitis or significant stenosis. Veins: No obvious venous abnormality within the limitations of this arterial phase study. Review of the MIP images confirms the above findings. NON-VASCULAR Lower chest: Bilateral pleural effusions are noted with adjacent subsegmental atelectasis. Hepatobiliary: Gallstone is noted. Dilated gallbladder is noted. No biliary dilatation is noted. Liver is otherwise unremarkable. Pancreas: There is again noted peripancreatic fluid collections including 10.6 x 4.4 cm bilobed abnormality which appears to contain a pseudoaneurysm as described above. Spleen: Normal in size. No definite splenic parenchymal abnormality is noted. Adrenals/Urinary Tract: Bilateral adrenal nodules  are again noted. No hydronephrosis or renal obstruction is noted. Stomach/Bowel: Stomach is unremarkable. No definite evidence of bowel obstruction is noted.  Lymphatic: No definite adenopathy is noted. Other: There is again noted a large complex fluid collection involving most of the right-sided the abdomen that measures 25 x 15 cm. Multiple air-fluid levels are noted within it suggesting internal loculations. Musculoskeletal: No acute or significant osseous findings. 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 There is also noted small amount of contrast extravasation involving a distal branch of the splenic artery which is seen supplying pseudoaneurysm in the splenic hilum, suggesting arterial injury. These results will be called to the ordering clinician or representative by the Radiologist Assistant, and communication documented in the PACS or zVision Dashboard. 3 cm infrarenal abdominal aortic aneurysm is noted. Recommend follow-up ultrasound every 3 years. This recommendation follows ACR consensus guidelines: White Paper of the ACR Incidental Findings Committee II on Vascular Findings. J Am Coll Radiol 2013; 10:789-794. NON-VASCULAR Continued presence of large complex fluid collection in the right-sided the abdomen measuring 25 x 15 cm. This is concerning for abscess. Multiple air-fluid levels are noted within it suggesting internal loculations. Continued presence of peripancreatic fluid collections including 10.6 x 4.4 cm bilobed abnormality around the pancreatic tail which contains pseudoaneurysm described above. Bilateral adrenal nodules are again noted. Bilateral pleural effusions are again noted with associated atelectasis. Aortic Atherosclerosis (ICD10-I70.0). Electronically Signed   By: Marijo Conception M.D.   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 Ultrasound-guided right abdominal abscess drain placement 2. Ultrasound-guided access of right common femoral artery 3. Celiac angiogram 4. Splenic artery angiogram and distal splenic branch embolization (x2) 5. Angio-Seal closure of right common femoral artery access MEDICATIONS: Patient currently on inpatient regiment of IV Zosyn. ANESTHESIA/SEDATION: Moderate (conscious) sedation was employed during this procedure. A total of Versed 1 mg and Fentanyl 50 mcg was administered intravenously by the radiology nurse. Total intra-service moderate Sedation Time: 160 minutes. The patient's level of consciousness and vital signs were monitored continuously by radiology nursing throughout the procedure under my direct supervision. CONTRAST:  130 mL intra arterial Omnipaque 300 FLUOROSCOPY: Radiation Exposure Index (as provided by the fluoroscopic device): 353 mGy Kerma COMPLICATIONS: None immediate. PROCEDURE: Informed consent was obtained from the patient following explanation of the procedure, risks, benefits and alternatives. The patient understands, agrees and consents for the procedure. All questions were addressed. A time out was performed prior to the initiation of the procedure. Maximal barrier sterile technique utilized including caps, mask, sterile gowns, sterile gloves, large sterile drape, hand hygiene, and chlorhexidine prep. Right abdominal wall skin prepped and draped in usual fashion. Ultrasound evaluation showed large loculated abscess. Following local lidocaine administration, 19 gauge Yueh needle was advanced into the right abdominal collection using continuous ultrasound guidance. Yueh catheter removed over 0.035 inch guidewire, serial dilation performed, and 14.0 [place] multipurpose pigtail drain placed. 20 mL purulent sample aspirated and sent for Gram  stain and culture. Total of 2700 mL of purulent fluid drained from the collection. Drain secured to skin with suture and connected to bag. Right groin skin prepped  and draped usual fashion. Ultrasound image documenting patency of the right common femoral artery was obtained and placed in permanent medical record. Sterile ultrasound probe cover and gel utilized throughout the procedure. Utilizing continuous ultrasound guidance, the right common femoral artery was accessed at the level of the femoral head with a 21 gauge needle. 21 gauge needle exchanged for a transitional dilator set over 0.018 inch guidewire. Transitional dilator set exchanged for 5 French sheath over 0.035 inch guidewire. Sos omni catheter utilized to select the celiac artery. 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 Common hepatic, left gastric, and splenic arteries are patent. 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 Lantern microcatheter was advanced into the splenic artery, however the base catheter was pushed out while trying to advance the microcatheter. Sos omni catheter was exchanged for a C2 catheter. C2 catheter advanced into the splenic artery utilizing Glidewire. Splenic angiogram again showed filling of the pseudoaneurysm. Lantern microcatheter advanced further into the splenic artery and repeat angiogram was performed. There was intermittent filling of the pseudoaneurysm it was very difficult to determine which branch of the splenic artery supplied the pseudoaneurysm. Lantern microcatheter advanced into 1 of the splenic artery branches nearest the pseudoaneurysm and angiogram was performed. Although no direct filling of the pseudoaneurysm was seen, irregular vessels were identified in this area. This artery was embolized with combination of 4 mm Ruby coils. The next closest branch of the splenic artery was selected and angiogram was performed which  showed flow into the pseudoaneurysm. The microcatheter was advanced beyond the origin of the pseudoaneurysm and the artery was embolized with 4 mm Ruby coils. The microcatheter was then retracted and embolization was completed with 4 mm Ruby coils. Microcatheter was removed. Splenic angiogram showed no additional flow into the pseudoaneurysm. Sheath angiogram demonstrated appropriate puncture at the level of the common femoral head. The groin was re-prepped and draped and closure was performed with 6 [place] Angio-Seal device. IMPRESSION: 1. 14.0 French drain placed in right abdominal abscess, removing 2700 mL of purulent fluid. 2. Splenic angiogram shows active extravasation from distal splenic branch, 2 of which were coil embolized. The branch responsible for directly supplying the pseudoaneurysm was successfully embolized distal and proximal to origin of the pseudoaneurysm. Electronically Signed   By: Miachel Roux M.D.   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 Ultrasound-guided right abdominal abscess drain placement 2. Ultrasound-guided access of right common femoral artery 3. Celiac angiogram 4. Splenic artery angiogram and distal splenic branch embolization (x2) 5. Angio-Seal closure of right common femoral artery access MEDICATIONS: Patient currently on inpatient regiment of IV Zosyn. ANESTHESIA/SEDATION: Moderate (conscious) sedation was employed during this procedure. A total of Versed 1 mg and Fentanyl 50 mcg was administered intravenously by the radiology nurse. Total intra-service moderate Sedation Time: 160 minutes. The patient's  level of consciousness and vital signs were monitored continuously by radiology nursing throughout the procedure under my direct supervision. CONTRAST:  130 mL intra arterial Omnipaque 300 FLUOROSCOPY: Radiation Exposure Index (as provided by the fluoroscopic device): 856 mGy Kerma COMPLICATIONS: None immediate. PROCEDURE: Informed consent was obtained from the patient following explanation of  the procedure, risks, benefits and alternatives. The patient understands, agrees and consents for the procedure. All questions were addressed. A time out was performed prior to the initiation of the procedure. Maximal barrier sterile technique utilized including caps, mask, sterile gowns, sterile gloves, large sterile drape, hand hygiene, and chlorhexidine prep. Right abdominal wall skin prepped and draped in usual fashion. Ultrasound evaluation showed large loculated abscess. Following local lidocaine administration, 19 gauge Yueh needle was advanced into the right abdominal collection using continuous ultrasound guidance. Yueh catheter removed over 0.035 inch guidewire, serial dilation performed, and 14.0 [place] multipurpose pigtail drain placed. 20 mL purulent sample aspirated and sent for Gram stain and culture. Total of 2700 mL of purulent fluid drained from the collection. Drain secured to skin with suture and connected to bag. Right groin skin prepped and draped usual fashion. Ultrasound image documenting patency of the right common femoral artery was obtained and placed in permanent medical record. Sterile ultrasound probe cover and gel utilized throughout the procedure. Utilizing continuous ultrasound guidance, the right common femoral artery was accessed at the level of the femoral head with a 21 gauge needle. 21 gauge needle exchanged for a transitional dilator set over 0.018 inch guidewire. Transitional dilator set exchanged for 5 French sheath over 0.035 inch guidewire. Sos omni catheter utilized to select the  celiac artery. 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 Common hepatic, left gastric, and splenic arteries are patent. 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 Lantern microcatheter was advanced into the splenic artery, however the base catheter was pushed out while trying to advance the microcatheter. Sos omni catheter was exchanged for a C2 catheter. C2 catheter advanced into the splenic artery utilizing Glidewire. Splenic angiogram again showed filling of the pseudoaneurysm. Lantern microcatheter advanced further into the splenic artery and repeat angiogram was performed. There was intermittent filling of the pseudoaneurysm it was very difficult to determine which branch of the splenic artery supplied the pseudoaneurysm. Lantern microcatheter advanced into 1 of the splenic artery branches nearest the pseudoaneurysm and angiogram was performed. Although no direct filling of the pseudoaneurysm was seen, irregular vessels were identified in this area. This artery was embolized with combination of 4 mm Ruby coils. The next closest branch of the splenic artery was selected and angiogram was performed which showed flow into the pseudoaneurysm. The microcatheter was advanced beyond the origin of the pseudoaneurysm and the artery was embolized with 4 mm Ruby coils. The microcatheter was then retracted and embolization was completed with 4 mm Ruby coils. Microcatheter was removed. Splenic angiogram showed no additional flow into the pseudoaneurysm. Sheath angiogram demonstrated appropriate puncture at the level of the common femoral head. The groin was re-prepped and draped and closure was performed with 6 [place] Angio-Seal device. IMPRESSION: 1. 14.0 French drain placed in right abdominal abscess, removing 2700 mL of purulent fluid. 2. Splenic angiogram shows active extravasation from distal splenic branch, 2 of which were coil  embolized. The branch responsible for directly supplying the pseudoaneurysm was successfully embolized distal and proximal to origin of the pseudoaneurysm. Electronically Signed   By: Miachel Roux M.D.   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 Ultrasound-guided right abdominal abscess drain placement 2. Ultrasound-guided access of right common femoral artery 3. Celiac angiogram 4. Splenic artery angiogram and distal splenic branch embolization (x2) 5. Angio-Seal closure of right common femoral artery access MEDICATIONS: Patient currently on inpatient regiment of IV Zosyn. ANESTHESIA/SEDATION: Moderate (conscious) sedation was employed during this procedure. A total of Versed 1 mg and Fentanyl 50 mcg was administered intravenously by the radiology nurse. Total intra-service moderate Sedation Time: 160 minutes. The patient's level of consciousness and vital signs were monitored continuously by radiology nursing throughout the procedure under my direct supervision. CONTRAST:  130 mL intra arterial Omnipaque 300 FLUOROSCOPY: Radiation Exposure Index (as provided by the fluoroscopic device): 902 mGy Kerma COMPLICATIONS: None immediate. PROCEDURE: Informed consent was obtained from the patient following explanation of the procedure, risks, benefits and alternatives. The patient understands, agrees and consents for the procedure. All questions were addressed. A time out was performed prior to the initiation of the procedure. Maximal barrier sterile technique utilized including caps, mask, sterile gowns, sterile gloves, large sterile drape, hand hygiene, and  chlorhexidine prep. Right abdominal wall skin prepped and draped in usual fashion. Ultrasound evaluation showed large loculated abscess. Following local lidocaine administration, 19 gauge Yueh needle was advanced into the right abdominal collection using continuous ultrasound guidance. Yueh catheter removed over 0.035 inch guidewire, serial dilation performed, and 14.0 [place] multipurpose pigtail drain placed. 20 mL purulent sample aspirated and sent for Gram stain and culture. Total of 2700 mL of purulent fluid drained from the collection. Drain secured to skin with suture and connected to bag. Right groin skin prepped and draped usual fashion. Ultrasound image documenting patency of the right common femoral artery was obtained and placed in permanent medical record. Sterile ultrasound probe cover and gel utilized throughout the procedure. Utilizing continuous ultrasound guidance, the right common femoral artery was accessed at the level of the femoral head with a 21 gauge needle. 21 gauge needle exchanged for a transitional dilator set over 0.018 inch guidewire. Transitional dilator set exchanged for 5 French sheath over 0.035 inch guidewire. Sos omni catheter utilized to select the celiac artery. 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 Common hepatic, left gastric, and splenic arteries are patent. 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 Lantern microcatheter was advanced into the splenic artery, however the base catheter was pushed out while trying to advance the microcatheter. Sos omni catheter was exchanged for a C2 catheter. C2 catheter advanced into the splenic artery utilizing Glidewire. Splenic angiogram again showed filling of the pseudoaneurysm. Lantern microcatheter advanced further into the splenic artery and repeat angiogram was performed. There was intermittent filling of the pseudoaneurysm it was very difficult to  determine which branch of the splenic artery supplied the pseudoaneurysm. Lantern microcatheter advanced into 1 of the splenic artery branches nearest the pseudoaneurysm and angiogram was performed. Although no direct filling of the pseudoaneurysm was seen, irregular vessels were identified in this area. This artery was embolized with combination of 4 mm Ruby coils. The next closest branch of the splenic artery was selected and angiogram was performed which showed flow into the pseudoaneurysm. The microcatheter was advanced beyond the origin of the pseudoaneurysm and the artery was embolized with 4 mm Ruby coils. The microcatheter was then retracted and embolization was completed with 4 mm Ruby coils. Microcatheter was removed. Splenic angiogram showed no additional flow into the pseudoaneurysm. Sheath angiogram demonstrated appropriate  puncture at the level of the common femoral head. The groin was re-prepped and draped and closure was performed with 6 [place] Angio-Seal device. IMPRESSION: 1. 14.0 French drain placed in right abdominal abscess, removing 2700 mL of purulent fluid. 2. Splenic angiogram shows active extravasation from distal splenic branch, 2 of which were coil embolized. The branch responsible for directly supplying the pseudoaneurysm was successfully embolized distal and proximal to origin of the pseudoaneurysm. Electronically Signed   By: Miachel Roux M.D.   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 Ultrasound-guided right abdominal abscess drain placement 2. Ultrasound-guided access of right common femoral  artery 3. Celiac angiogram 4. Splenic artery angiogram and distal splenic branch embolization (x2) 5. Angio-Seal closure of right common femoral artery access MEDICATIONS: Patient currently on inpatient regiment of IV Zosyn. ANESTHESIA/SEDATION: Moderate (conscious) sedation was employed during this procedure. A total of Versed 1 mg and Fentanyl 50 mcg was administered intravenously by the radiology nurse. Total intra-service moderate Sedation Time: 160 minutes. The patient's level of consciousness and vital signs were monitored continuously by radiology nursing throughout the procedure under my direct supervision. CONTRAST:  130 mL intra arterial Omnipaque 300 FLUOROSCOPY: Radiation Exposure Index (as provided by the fluoroscopic device): 720 mGy Kerma COMPLICATIONS: None immediate. PROCEDURE: Informed consent was obtained from the patient following explanation of the procedure, risks, benefits and alternatives. The patient understands, agrees and consents for the procedure. All questions were addressed. A time out was performed prior to the initiation of the procedure. Maximal barrier sterile technique utilized including caps, mask, sterile gowns, sterile gloves, large sterile drape, hand hygiene, and chlorhexidine prep. Right abdominal wall skin prepped and draped in usual fashion. Ultrasound evaluation showed large loculated abscess. Following local lidocaine administration, 19 gauge Yueh needle was advanced into the right abdominal collection using continuous ultrasound guidance. Yueh catheter removed over 0.035 inch guidewire, serial dilation performed, and 14.0 [place] multipurpose pigtail drain placed. 20 mL purulent sample aspirated and sent for Gram stain and culture. Total of 2700 mL of purulent fluid drained from the collection. Drain secured to skin with suture and connected to bag. Right groin skin prepped and draped usual fashion. Ultrasound image documenting patency of the right common femoral artery  was obtained and placed in permanent medical record. Sterile ultrasound probe cover and gel utilized throughout the procedure. Utilizing continuous ultrasound guidance, the right common femoral artery was accessed at the level of the femoral head with a 21 gauge needle. 21 gauge needle exchanged for a transitional dilator set over 0.018 inch guidewire. Transitional dilator set exchanged for 5 French sheath over 0.035 inch guidewire. Sos omni catheter utilized to select the celiac artery. 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 Common hepatic, left gastric, and splenic arteries are patent. 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 Lantern microcatheter was advanced into the splenic artery, however the base catheter was pushed out while trying to advance the microcatheter. Sos omni catheter was exchanged for a C2 catheter. C2 catheter advanced into the splenic artery utilizing Glidewire. Splenic angiogram again showed filling of the pseudoaneurysm. Lantern microcatheter advanced  further into the splenic artery and repeat angiogram was performed. There was intermittent filling of the pseudoaneurysm it was very difficult to determine which branch of the splenic artery supplied the pseudoaneurysm. Lantern microcatheter advanced into 1 of the splenic artery branches nearest the pseudoaneurysm and angiogram was performed. Although no direct filling of the pseudoaneurysm was seen, irregular vessels were identified in this area. This artery was embolized with combination of 4 mm Ruby coils. The next closest branch of the splenic artery was selected and angiogram was performed which showed flow into the pseudoaneurysm. The microcatheter was advanced beyond the origin of the pseudoaneurysm and the artery was embolized with 4 mm Ruby coils. The microcatheter was then retracted and embolization was completed with 4 mm Ruby coils. Microcatheter  was removed. Splenic angiogram showed no additional flow into the pseudoaneurysm. Sheath angiogram demonstrated appropriate puncture at the level of the common femoral head. The groin was re-prepped and draped and closure was performed with 6 [place] Angio-Seal device. IMPRESSION: 1. 14.0 French drain placed in right abdominal abscess, removing 2700 mL of purulent fluid. 2. Splenic angiogram shows active extravasation from distal splenic branch, 2 of which were coil embolized. The branch responsible for directly supplying the pseudoaneurysm was successfully embolized distal and proximal to origin of the pseudoaneurysm. Electronically Signed   By: Miachel Roux M.D.   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 Ultrasound-guided right abdominal abscess drain placement 2. Ultrasound-guided access of right common femoral artery 3. Celiac angiogram 4. Splenic artery angiogram and distal splenic branch embolization (x2) 5. Angio-Seal closure of right common femoral artery access MEDICATIONS: Patient currently on inpatient regiment of IV Zosyn. ANESTHESIA/SEDATION: Moderate (conscious) sedation was employed during this procedure. A total of Versed 1 mg and Fentanyl 50 mcg was administered intravenously by the radiology nurse. Total intra-service moderate Sedation Time: 160 minutes. The patient's level of consciousness and vital signs were monitored continuously by radiology nursing throughout the procedure under my direct supervision. CONTRAST:  130 mL intra arterial Omnipaque 300 FLUOROSCOPY: Radiation Exposure Index (as provided by the  fluoroscopic device): 353 mGy Kerma COMPLICATIONS: None immediate. PROCEDURE: Informed consent was obtained from the patient following explanation of the procedure, risks, benefits and alternatives. The patient understands, agrees and consents for the procedure. All questions were addressed. A time out was performed prior to the initiation of the procedure. Maximal barrier sterile technique utilized including caps, mask, sterile gowns, sterile gloves, large sterile drape, hand hygiene, and chlorhexidine prep. Right abdominal wall skin prepped and draped in usual fashion. Ultrasound evaluation showed large loculated abscess. Following local lidocaine administration, 19 gauge Yueh needle was advanced into the right abdominal collection using continuous ultrasound guidance. Yueh catheter removed over 0.035 inch guidewire, serial dilation performed, and 14.0 [place] multipurpose pigtail drain placed. 20 mL purulent sample aspirated and sent for Gram stain and culture. Total of 2700 mL of purulent fluid drained from the collection. Drain secured to skin with suture and connected to bag. Right groin skin prepped and draped usual fashion. Ultrasound image documenting patency of the right common femoral artery was obtained and placed in permanent medical record. Sterile ultrasound probe cover and gel utilized  throughout the procedure. Utilizing continuous ultrasound guidance, the right common femoral artery was accessed at the level of the femoral head with a 21 gauge needle. 21 gauge needle exchanged for a transitional dilator set over 0.018 inch guidewire. Transitional dilator set exchanged for 5 French sheath over 0.035 inch guidewire. Sos omni catheter utilized to select the celiac artery. 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 Common hepatic, left gastric, and splenic arteries are patent. 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  Lantern microcatheter was advanced into the splenic artery, however the base catheter was pushed out while trying to advance the microcatheter. Sos omni catheter was exchanged for a C2 catheter. C2 catheter advanced into the splenic artery utilizing Glidewire. Splenic angiogram again showed filling of the pseudoaneurysm. Lantern microcatheter advanced further into the splenic artery and repeat angiogram was performed. There was intermittent filling of the pseudoaneurysm it was very difficult to determine which branch of the splenic artery supplied the pseudoaneurysm. Lantern microcatheter advanced into 1 of the splenic artery branches nearest the pseudoaneurysm and angiogram was performed. Although no direct filling of the pseudoaneurysm was seen, irregular vessels were identified in this area. This artery was embolized with combination of 4 mm Ruby coils. The next closest branch of the splenic artery was selected and angiogram was performed which showed flow into the pseudoaneurysm. The microcatheter was advanced beyond the origin of the pseudoaneurysm and the artery was embolized with 4 mm Ruby coils. The microcatheter was then retracted and embolization was completed with 4 mm Ruby coils. Microcatheter was removed. Splenic angiogram showed no additional flow into the pseudoaneurysm. Sheath angiogram demonstrated appropriate puncture at the level of the common femoral head. The groin was re-prepped and draped and closure was performed with 6 [place] Angio-Seal device. IMPRESSION: 1. 14.0 French drain placed in right abdominal abscess, removing 2700 mL of purulent fluid. 2. Splenic angiogram shows active extravasation from distal splenic branch, 2 of which were coil embolized. The branch responsible for directly supplying the pseudoaneurysm was successfully embolized distal and proximal to origin of the pseudoaneurysm. Electronically Signed   By: Miachel Roux M.D.   On: 05/10/2022 11:19     Anti-infectives: Anti-infectives (From admission, onward)    Start     Dose/Rate Route Frequency Ordered Stop   05/08/22 2200  piperacillin-tazobactam (ZOSYN) IVPB 3.375 g        3.375 g 12.5 mL/hr over 240 Minutes Intravenous Every 8 hours 05/08/22 2008         Assessment/Plan: POD#23 s/p lap assisted R colectomy for adenocarcinoma, Dr. 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 Has seen Dr. Burr Medico - CT with large post-op abscess - IR drainage 5/26 with 2.7L out initially, 3.8L documented for the last 24h, S&S pending  - also concern for splenic artery PSA on CT, CTA this AM with concern for dissection at bifurcation of celiac as well as some extravasation from distal branch of splenic artery  -continue abx -bowel regimen started today -can dc with drain at some point, will need repeat ct also   FEN - soft diet as tolerated  VTE - Eliquis and plavix on hold   ID - Zosyn 5/25>>   - Per TRH/Cards -  CAD with history of PCI to LAD Recurrent ventricular tachycardia status post AICD placement, three episodes  last PM as long as 24m Cardiology was notified, expected given underlying illness and tolerating episodes.  Ischemic cardiomyopathy CHF, systolic and diastolic, LV EF 35 to 446%Atrial fibrillation   MRolm Bookbinder5/28/2023

## 2022-05-11 NOTE — Evaluation (Signed)
Physical Therapy Evaluation Patient Details Name: Gregory [name] MRN: [id number] DOB: 17-Aug-1961 Today's Date: 05/11/2022  History of Present Illness  The pt is a 61 yo male presenting 5/25 after AICD firing x3 and pt experiencing SOB. 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 PMH includes: CAD, VT, OSA on CPAP, seizures, CHF, HLD, HTN, obesity, afib, PVCs, and metastatic colon cancer s/p colon resection 5/5.   Clinical Impression  Pt in bed upon arrival of PT, agreeable to evaluation at this time. Prior to admission the pt was independent with mobility in his home, living with his son who is available to assist throughout the day. The pt does have 14 steps to reach his room/bathroom, but was managing without DME PTA. The pt now presents with limitations in functional mobility, strength, power, endurance, and dynamic stability due to above dx, and will continue to benefit from skilled PT to address these deficits. He required minA to complete bed mobility and was educated on log roll for comfort after abdominal surgery. He completed sit-stand from slightly elevated bed (due to pt's height) without assist, but with significantly increased time to rise due to poor functional strength and power in BLE. He also demos poor endurance and muscular endurance with gait, fatiguing after ~10 ft, even with BUE support on RW. VSS on 2L O2 with all activity, pt highly motivated to return to greater independence and will continue to benefit from skilled PT acutely and after d/c to facilitate functional recovery and maintain greatest level of independence possible.         Recommendations for follow up therapy are one component of a multi-disciplinary discharge planning process, led by the  attending physician.  Recommendations may be updated based on patient status, additional functional criteria and insurance authorization.  Follow Up Recommendations Home health PT    Assistance Recommended at Discharge Intermittent Supervision/Assistance  Patient can return home with the following  A little help with walking and/or transfers;A little help with bathing/dressing/bathroom;Assistance with cooking/housework;Assist for transportation;Help with stairs or ramp for entrance    Equipment Recommendations Rolling walker (2 wheels);BSC/3in1  Recommendations for Other Services       Functional Status Assessment Patient has had a recent decline in their functional status and demonstrates the ability to make significant improvements in function in a reasonable and predictable amount of time.     Precautions / Restrictions Precautions Precautions: Fall Precaution Comments: watch O2, pt on 2L this session Restrictions Weight Bearing Restrictions: No      Mobility  Bed Mobility Overal bed mobility: Needs Assistance Bed Mobility: Supine to Sit     Supine to sit: Min assist     General bed mobility comments: pt educated on log roll for abdominal precautions, needing minA to pull to sitting due to limited strength and pain in core    Transfers Overall transfer level: Needs assistance Equipment used: Rolling walker (2 wheels) Transfers: Sit to/from Stand Sit to Stand: Min guard, From elevated surface           General transfer comment: bed elevated due to pt height, minG with significantly increased time to rise. poor functional power in BLE. dependent on BUE support to steady. pt reports significant dizziness with initial stand    Ambulation/Gait Ambulation/Gait assistance: Min assist, +  2 safety/equipment Gait Distance (Feet): 15 Feet Assistive device: Rolling walker (2 wheels) Gait Pattern/deviations: Step-to pattern, Decreased stride length, Shuffle, Trunk  flexed Gait velocity: decreased Gait velocity interpretation: <1.31 ft/sec, indicative of household ambulator   General Gait Details: pt with small, shuffling steps, trunk flexed despite cues for proximity to RW and posture. pt reporting significant fatigue after ~10 ft. SpO2 94% on 2L     Balance Overall balance assessment: Needs assistance Sitting-balance support: No upper extremity supported, Feet supported Sitting balance-Leahy Scale: Good     Standing balance support: Bilateral upper extremity supported, During functional activity Standing balance-Leahy Scale: Poor Standing balance comment: dependent on BUE support to maintain static or dynamic stance                             Pertinent Vitals/Pain Pain Assessment Pain Assessment: No/denies pain    Home Living Family/patient expects to be discharged to:: Private residence Living Arrangements: Children Available Help at Discharge: Family;Available 24 hours/day Type of Home: House Home Access: Level entry     Alternate Level Stairs-Number of Steps: 14 Home Layout: Two level;Bed/bath upstairs;1/2 bath on main level Home Equipment: None Additional Comments: pt states not needing DME, son assists with driving and errands    Prior Function Prior Level of Function : Independent/Modified Independent             Mobility Comments: pt states independent without DME, prior to issues starting had been going to the gym daily for cardio and strength training. ADLs Comments: pt reports independence, family assists with IADLs     Hand Dominance        Extremity/Trunk Assessment   Upper Extremity Assessment Upper Extremity Assessment: Overall WFL for tasks assessed    Lower Extremity Assessment Lower Extremity Assessment: Generalized weakness (no focal deficits to MMT, but limited endurance, pt fatiguing after 12 ft ambulation)    Cervical / Trunk Assessment Cervical / Trunk Assessment: Other  exceptions Cervical / Trunk Exceptions: s/p abdominal surgery on 5/5, R abdominal drain placement 5/26  Communication   Communication: No difficulties  Cognition Arousal/Alertness: Awake/alert Behavior During Therapy: WFL for tasks assessed/performed Overall Cognitive Status: Within Functional Limits for tasks assessed                                 General Comments: pt pleasant and agreeable, able to follow all instructions and with good safety awareness        General Comments General comments (skin integrity, edema, etc.): SpO2 94% on 2L with gait. O2 96% at rest, pt left with RN present and on RA    Exercises General Exercises - Lower Extremity Ankle Circles/Pumps: AROM, Both, 10 reps, Seated Quad Sets: AROM, Both, 10 reps, Seated   Assessment/Plan    PT Assessment Patient needs continued PT services  PT Problem List Cardiopulmonary status limiting activity;Decreased strength;Decreased balance;Decreased activity tolerance;Decreased mobility       PT Treatment Interventions DME instruction;Gait training;Stair training;Functional mobility training;Therapeutic activities;Therapeutic exercise;Balance training;Patient/family education    PT Goals (Current goals can be found in the Care Plan section)  Acute Rehab PT Goals Patient Stated Goal: regain strength and endurance to be more independentq PT Goal Formulation: With patient Time For Goal Achievement: 05/25/22 Potential to Achieve Goals: Good    Frequency Min 3X/week        AM-PAC PT "6 Clicks" Mobility  Outcome Measure Help needed turning from your back to your side while in a flat bed without using bedrails?: A Little Help needed moving from lying on your back to sitting on the side of a flat bed without using bedrails?: A Little Help needed moving to and from a bed to a chair (including a wheelchair)?: A Little Help needed standing up from a chair using your arms (e.g., wheelchair or bedside chair)?:  A Little Help needed to walk in hospital room?: Total (unable to ambulate 20 ft at this time) Help needed climbing 3-5 steps with a railing? : A Lot 6 Click Score: 15    End of Session Equipment Utilized During Treatment: Gait belt;Oxygen Activity Tolerance: Patient tolerated treatment well;Patient limited by fatigue Patient left: in chair;with call bell/phone within reach;with nursing/sitter in room;with chair alarm set Nurse Communication: Mobility status PT Visit Diagnosis: Other abnormalities of gait and mobility (R26.89);Muscle weakness (generalized) (M62.81)    Time: 2376-2831 PT Time Calculation (min) (ACUTE ONLY): 34 min   Charges:   PT Evaluation $PT Eval Moderate Complexity: 1 Mod PT Treatments $Therapeutic Exercise: 8-22 mins        West Carbo, PT, DPT   Acute Rehabilitation Department Pager #: [phone]  Sandra Cockayne 05/11/2022, 10:43 AM

## 2022-05-11 NOTE — Progress Notes (Signed)
Progress Note  Patient Name: Gregory [name] Date of Encounter: 05/11/2022  Primary Cardiologist: Sanda Klein, MD   Subjective   C/o hiccups. My stomach feels full. C/o constipation.  Inpatient Medications    Scheduled Meds:  atorvastatin  80 mg Oral QPM   dapagliflozin propanediol  10 mg Oral Daily   ezetimibe  10 mg Oral Daily   insulin aspart  0-9 Units Subcutaneous TID WC   levETIRAcetam  500 mg Oral BID   pantoprazole  40 mg Oral Daily   sodium chloride flush  3 mL Intravenous Q12H   sodium chloride flush  5 mL Intracatheter Q8H   Continuous Infusions:  sodium chloride     amiodarone 30 mg/hr (05/11/22 0721)   piperacillin-tazobactam (ZOSYN)  IV Stopped (05/11/22 0356)   PRN Meds: sodium chloride, acetaminophen **OR** acetaminophen, HYDROmorphone (DILAUDID) injection, oxyCODONE, sodium chloride flush   Vital Signs    Vitals:   05/10/22 0547 05/10/22 1100 05/10/22 2003 05/11/22 0434  BP: 107/82 112/78 120/79 119/73  Pulse: 76 72 71 75  Resp: '19 18 19 18  '$ Temp: 97.8 F (36.6 C) 97.8 F (36.6 C) 97.9 F (36.6 C) 97.6 F (36.4 C)  TempSrc: Oral Oral Oral Oral  SpO2: 97% 97% 95% 99%  Weight:      Height:        Intake/Output Summary (Last 24 hours) at 05/11/2022 0752 Last data filed at 05/11/2022 0654 Gross per 24 hour  Intake 1322.99 ml  Output 1950 ml  Net -627.01 ml   Filed Weights   05/08/22 0841 05/08/22 1626  Weight: 95.7 kg 102.1 kg    Telemetry    Nsr with one episode of VT, s/p ATP - Personally Reviewed  ECG    none - Personally Reviewed  Physical Exam   GEN: No acute distress.   Neck: No JVD Cardiac: RRR, no murmurs, rubs, or gallops.  Respiratory: Clear to auscultation bilaterally. GI: Soft, nontender, non-distended ; drain in place MS: No edema; No deformity. Neuro:  Nonfocal  Psych: Normal affect   Labs    Chemistry Recent Labs  Lab 05/09/22 0615 05/10/22 0315 05/10/22 0636 05/11/22 0251  NA 130* 128*   --  129*  K 4.8 5.2* 5.1 4.7  CL 100 98  --  99  CO2 22 19*  --  22  GLUCOSE 95 119*  --  127*  BUN 25* 22  --  15  CREATININE 0.96 0.86  --  0.73  CALCIUM 7.9* 7.8*  --  7.7*  PROT  --  5.7*  --  5.2*  ALBUMIN  --  1.6*  --  <1.5*  AST  --  56*  --  70*  ALT  --  43  --  44  ALKPHOS  --  114  --  156*  BILITOT  --  0.7  --  0.5  GFRNONAA >60 >60  --  >60  ANIONGAP 8 11  --  8     Hematology Recent Labs  Lab 05/09/22 0230 05/10/22 0315 05/11/22 0251  WBC 17.9* 28.8* 28.6*  RBC 5.57 4.83 4.19*  HGB 13.5 12.1* 10.7*  HCT 46.1 39.8 34.5*  MCV 82.8 82.4 82.3  MCH 24.2* 25.1* 25.5*  MCHC 29.3* 30.4 31.0  RDW 24.2* 24.0* 22.5*  PLT 438* 407* 337    Cardiac EnzymesNo results for input(s): TROPONINI in the last 168 hours. No results for input(s): TROPIPOC in the last 168 hours.   BNPNo results for input(s):  BNP, PROBNP in the last 168 hours.   DDimer No results for input(s): DDIMER in the last 168 hours.   Radiology    CT ANGIO ABDOMEN W &/OR WO CONTRAST  Result Date: 05/09/2022 CLINICAL DATA:  Splenic artery dissection. EXAM: CT ANGIOGRAPHY ABDOMEN TECHNIQUE: Multidetector CT imaging of the abdomen was performed using the standard protocol during bolus administration of intravenous contrast. Multiplanar reconstructed images and MIPs were obtained and reviewed to evaluate the vascular anatomy. RADIATION DOSE REDUCTION: This exam was performed according to the departmental dose-optimization program which includes automated exposure control, adjustment of the mA and/or kV according to patient size and/or use of iterative reconstruction technique. CONTRAST:  62m OMNIPAQUE IOHEXOL 350 MG/ML SOLN COMPARISON:  CT of May 08, 2022. FINDINGS: VASCULAR Aorta: Atherosclerosis of abdominal aorta is noted without dissection. 3 cm fusiform infrarenal abdominal aortic aneurysm is noted. 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 No thrombus is noted. 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 The common hepatic, left gastric and splenic arteries appear to be patent. There does appear to be some extravasation of contrast arising from a distal branch of the splenic artery in the region of the splenic hilum, best seen on image number 33 of series 5. 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 SMA: Patent without evidence of aneurysm, dissection, vasculitis or significant stenosis. Renals: Both renal arteries are patent without evidence of aneurysm, dissection, vasculitis, fibromuscular dysplasia or significant stenosis. IMA: Patent without evidence of aneurysm, dissection, vasculitis or significant stenosis. Inflow: Patent without evidence of aneurysm, dissection, vasculitis or significant stenosis. Veins: No obvious venous abnormality within the limitations of this arterial phase study. Review of the MIP images confirms the above findings. NON-VASCULAR Lower chest: Bilateral pleural effusions are noted with adjacent subsegmental atelectasis. Hepatobiliary: Gallstone is noted. Dilated gallbladder is noted. No biliary dilatation is noted. Liver is otherwise unremarkable. Pancreas: There is again noted peripancreatic fluid collections including 10.6 x 4.4 cm bilobed abnormality which appears to contain a pseudoaneurysm as described above. Spleen: Normal in size. No definite splenic parenchymal abnormality is noted. Adrenals/Urinary Tract: Bilateral adrenal nodules are again noted. No hydronephrosis or renal obstruction is noted. Stomach/Bowel: Stomach is unremarkable. No definite evidence of bowel obstruction is noted. Lymphatic: No definite adenopathy is noted. Other: There is again noted a large complex fluid collection involving most of the right-sided the abdomen that measures 25 x 15 cm. Multiple air-fluid levels are noted within it suggesting internal loculations.  Musculoskeletal: No acute or significant osseous findings. 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 There is also noted small amount of contrast extravasation involving a distal branch of the splenic artery which is seen supplying pseudoaneurysm in the splenic hilum, suggesting arterial injury. These results will be called to the ordering clinician or representative by the Radiologist Assistant, and communication documented in the PACS or zVision Dashboard. 3 cm infrarenal abdominal aortic aneurysm is noted. Recommend follow-up ultrasound every 3 years. This recommendation follows ACR consensus guidelines: White Paper of the ACR Incidental Findings Committee II on Vascular Findings. J Am Coll Radiol 2013; 10:789-794. NON-VASCULAR Continued presence of large complex fluid collection in the right-sided the abdomen measuring 25 x 15 cm. This is concerning for abscess. Multiple air-fluid levels are noted within it suggesting internal loculations.  Continued presence of peripancreatic fluid collections including 10.6 x 4.4 cm bilobed abnormality around the pancreatic tail which contains pseudoaneurysm described above. Bilateral adrenal nodules are again noted. Bilateral pleural effusions are again noted with associated atelectasis. Aortic Atherosclerosis (ICD10-I70.0). Electronically Signed   By: Marijo Conception M.D.   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 Ultrasound-guided right abdominal abscess drain placement 2. Ultrasound-guided access of right common femoral  artery 3. Celiac angiogram 4. Splenic artery angiogram and distal splenic branch embolization (x2) 5. Angio-Seal closure of right common femoral artery access MEDICATIONS: Patient currently on inpatient regiment of IV Zosyn. ANESTHESIA/SEDATION: Moderate (conscious) sedation was employed during this procedure. A total of Versed 1 mg and Fentanyl 50 mcg was administered intravenously by the radiology nurse. Total intra-service moderate Sedation Time: 160 minutes. The patient's level of consciousness and vital signs were monitored continuously by radiology nursing throughout the procedure under my direct supervision. CONTRAST:  130 mL intra arterial Omnipaque 300 FLUOROSCOPY: Radiation Exposure Index (as provided by the fluoroscopic device): 182 mGy Kerma COMPLICATIONS: None immediate. PROCEDURE: Informed consent was obtained from the patient following explanation of the procedure, risks, benefits and alternatives. The patient understands, agrees and consents for the procedure. All questions were addressed. A time out was performed prior to the initiation of the procedure. Maximal barrier sterile technique utilized including caps, mask, sterile gowns, sterile gloves, large sterile drape, hand hygiene, and chlorhexidine prep. Right abdominal wall skin prepped and draped in usual fashion. Ultrasound evaluation showed large loculated abscess. Following local lidocaine administration, 19 gauge Yueh needle was advanced into the right abdominal collection using continuous ultrasound guidance. Yueh catheter removed over 0.035 inch guidewire, serial dilation performed, and 14.0 [place] multipurpose pigtail drain placed. 20 mL purulent sample aspirated and sent for Gram stain and culture. Total of 2700 mL of purulent fluid drained from the collection. Drain secured to skin with suture and connected to bag. Right groin skin prepped and draped usual fashion. Ultrasound image documenting patency of the right common femoral artery  was obtained and placed in permanent medical record. Sterile ultrasound probe cover and gel utilized throughout the procedure. Utilizing continuous ultrasound guidance, the right common femoral artery was accessed at the level of the femoral head with a 21 gauge needle. 21 gauge needle exchanged for a transitional dilator set over 0.018 inch guidewire. Transitional dilator set exchanged for 5 French sheath over 0.035 inch guidewire. Sos omni catheter utilized to select the celiac artery. 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 Common hepatic, left gastric, and splenic arteries are patent. 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 Lantern microcatheter was advanced into the splenic artery, however the base catheter was pushed out while trying to advance the microcatheter. Sos omni catheter was exchanged for a C2 catheter. C2 catheter advanced into the splenic artery utilizing Glidewire. Splenic angiogram again showed filling of the pseudoaneurysm. Lantern microcatheter advanced further into the splenic artery and repeat angiogram was performed. There was intermittent filling of the pseudoaneurysm it was very difficult to determine which branch of the splenic artery supplied the pseudoaneurysm. Lantern microcatheter advanced into 1 of the splenic artery branches  nearest the pseudoaneurysm and angiogram was performed. Although no direct filling of the pseudoaneurysm was seen, irregular vessels were identified in this area. This artery was embolized with combination of 4 mm Ruby coils. The next closest branch of the splenic artery was selected and angiogram was performed which showed flow into the pseudoaneurysm. The microcatheter was advanced beyond the origin of the pseudoaneurysm and the artery was embolized with 4 mm Ruby coils. The microcatheter was then retracted and embolization was completed with 4 mm Ruby coils. Microcatheter  was removed. Splenic angiogram showed no additional flow into the pseudoaneurysm. Sheath angiogram demonstrated appropriate puncture at the level of the common femoral head. The groin was re-prepped and draped and closure was performed with 6 [place] Angio-Seal device. IMPRESSION: 1. 14.0 French drain placed in right abdominal abscess, removing 2700 mL of purulent fluid. 2. Splenic angiogram shows active extravasation from distal splenic branch, 2 of which were coil embolized. The branch responsible for directly supplying the pseudoaneurysm was successfully embolized distal and proximal to origin of the pseudoaneurysm. Electronically Signed   By: Miachel Roux M.D.   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 Ultrasound-guided right abdominal abscess drain placement 2. Ultrasound-guided access of right common femoral artery 3. Celiac angiogram 4. Splenic artery angiogram and distal splenic branch embolization (x2) 5. Angio-Seal closure of right common femoral artery access MEDICATIONS: Patient currently on inpatient regiment of IV Zosyn. ANESTHESIA/SEDATION: Moderate (conscious) sedation was employed during this procedure. A total of Versed 1 mg and Fentanyl 50 mcg was administered intravenously by the radiology nurse. Total intra-service moderate Sedation Time: 160 minutes. The patient's level of consciousness and vital signs were monitored continuously by radiology nursing throughout the procedure under my direct supervision. CONTRAST:  130 mL intra arterial Omnipaque 300 FLUOROSCOPY: Radiation Exposure Index (as provided by the fluoroscopic  device): 696 mGy Kerma COMPLICATIONS: None immediate. PROCEDURE: Informed consent was obtained from the patient following explanation of the procedure, risks, benefits and alternatives. The patient understands, agrees and consents for the procedure. All questions were addressed. A time out was performed prior to the initiation of the procedure. Maximal barrier sterile technique utilized including caps, mask, sterile gowns, sterile gloves, large sterile drape, hand hygiene, and chlorhexidine prep. Right abdominal wall skin prepped and draped in usual fashion. Ultrasound evaluation showed large loculated abscess. Following local lidocaine administration, 19 gauge Yueh needle was advanced into the right abdominal collection using continuous ultrasound guidance. Yueh catheter removed over 0.035 inch guidewire, serial dilation performed, and 14.0 [place] multipurpose pigtail drain placed. 20 mL purulent sample aspirated and sent for Gram stain and culture. Total of 2700 mL of purulent fluid drained from the collection. Drain secured to skin with suture and connected to bag. Right groin skin prepped and draped usual fashion. Ultrasound image documenting patency of the right common femoral artery was obtained and placed in permanent medical record. Sterile ultrasound probe cover and gel utilized throughout the procedure. Utilizing continuous ultrasound guidance, the right common femoral artery was accessed at the level of the femoral head with a 21 gauge needle. 21 gauge needle exchanged for a transitional dilator set over 0.018 inch guidewire. Transitional dilator set exchanged for 5  French sheath over 0.035 inch guidewire. Sos omni catheter utilized to select the celiac artery. 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 Common hepatic, left gastric, and splenic arteries are patent. 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 Lantern  microcatheter was advanced into the splenic artery, however the base catheter was pushed out while trying to advance the microcatheter. Sos omni catheter was exchanged for a C2 catheter. C2 catheter advanced into the splenic artery utilizing Glidewire. Splenic angiogram again showed filling of the pseudoaneurysm. Lantern microcatheter advanced further into the splenic artery and repeat angiogram was performed. There was intermittent filling of the pseudoaneurysm it was very difficult to determine which branch of the splenic artery supplied the pseudoaneurysm. Lantern microcatheter advanced into 1 of the splenic artery branches nearest the pseudoaneurysm and angiogram was performed. Although no direct filling of the pseudoaneurysm was seen, irregular vessels were identified in this area. This artery was embolized with combination of 4 mm Ruby coils. The next closest branch of the splenic artery was selected and angiogram was performed which showed flow into the pseudoaneurysm. The microcatheter was advanced beyond the origin of the pseudoaneurysm and the artery was embolized with 4 mm Ruby coils. The microcatheter was then retracted and embolization was completed with 4 mm Ruby coils. Microcatheter was removed. Splenic angiogram showed no additional flow into the pseudoaneurysm. Sheath angiogram demonstrated appropriate puncture at the level of the common femoral head. The groin was re-prepped and draped and closure was performed with 6 [place] Angio-Seal device. IMPRESSION: 1. 14.0 French drain placed in right abdominal abscess, removing 2700 mL of purulent fluid. 2. Splenic angiogram shows active extravasation from distal splenic branch, 2 of which were coil embolized. The branch responsible for directly supplying the pseudoaneurysm was successfully embolized distal and proximal to origin of the pseudoaneurysm. Electronically Signed   By: Miachel Roux M.D.   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 Ultrasound-guided right abdominal abscess drain placement 2. Ultrasound-guided access of right common femoral artery 3. Celiac angiogram 4. Splenic artery angiogram and distal splenic branch embolization (x2) 5. Angio-Seal closure of right common femoral artery access MEDICATIONS: Patient currently on inpatient regiment of IV Zosyn. ANESTHESIA/SEDATION: Moderate (conscious) sedation was employed during this procedure. A total of Versed 1 mg and Fentanyl 50 mcg was administered intravenously by the radiology nurse. Total intra-service moderate Sedation Time: 160 minutes. The patient's level of consciousness and vital signs were monitored continuously by radiology nursing throughout the procedure under my direct supervision. CONTRAST:  130 mL intra arterial Omnipaque 300 FLUOROSCOPY: Radiation Exposure Index (as provided by the fluoroscopic device): 016 mGy Kerma COMPLICATIONS: None immediate. PROCEDURE: Informed consent was obtained from the patient following explanation of the procedure, risks, benefits and alternatives. The patient understands, agrees and consents for the procedure. All questions were addressed. A time out was performed prior to the initiation of the procedure. Maximal barrier sterile technique utilized including caps, mask, sterile gowns, sterile gloves, large sterile drape, hand hygiene, and chlorhexidine prep. Right abdominal wall skin prepped and draped in usual fashion. Ultrasound evaluation showed large loculated abscess. Following local lidocaine administration, 19 gauge Teressa Lower  needle was advanced into the right abdominal collection using  continuous ultrasound guidance. Yueh catheter removed over 0.035 inch guidewire, serial dilation performed, and 14.0 [place] multipurpose pigtail drain placed. 20 mL purulent sample aspirated and sent for Gram stain and culture. Total of 2700 mL of purulent fluid drained from the collection. Drain secured to skin with suture and connected to bag. Right groin skin prepped and draped usual fashion. Ultrasound image documenting patency of the right common femoral artery was obtained and placed in permanent medical record. Sterile ultrasound probe cover and gel utilized throughout the procedure. Utilizing continuous ultrasound guidance, the right common femoral artery was accessed at the level of the femoral head with a 21 gauge needle. 21 gauge needle exchanged for a transitional dilator set over 0.018 inch guidewire. Transitional dilator set exchanged for 5 French sheath over 0.035 inch guidewire. Sos omni catheter utilized to select the celiac artery. 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 Common hepatic, left gastric, and splenic arteries are patent. 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 Lantern microcatheter was advanced into the splenic artery, however the base catheter was pushed out while trying to advance the microcatheter. Sos omni catheter was exchanged for a C2 catheter. C2 catheter advanced into the splenic artery utilizing Glidewire. Splenic angiogram again showed filling of the pseudoaneurysm. Lantern microcatheter advanced further into the splenic artery and repeat angiogram was performed. There was intermittent filling of the pseudoaneurysm it was very difficult to determine which branch of the splenic artery supplied the pseudoaneurysm. Lantern microcatheter advanced into 1 of the splenic artery branches nearest the pseudoaneurysm and angiogram was performed. Although no direct filling of the pseudoaneurysm was  seen, irregular vessels were identified in this area. This artery was embolized with combination of 4 mm Ruby coils. The next closest branch of the splenic artery was selected and angiogram was performed which showed flow into the pseudoaneurysm. The microcatheter was advanced beyond the origin of the pseudoaneurysm and the artery was embolized with 4 mm Ruby coils. The microcatheter was then retracted and embolization was completed with 4 mm Ruby coils. Microcatheter was removed. Splenic angiogram showed no additional flow into the pseudoaneurysm. Sheath angiogram demonstrated appropriate puncture at the level of the common femoral head. The groin was re-prepped and draped and closure was performed with 6 [place] Angio-Seal device. IMPRESSION: 1. 14.0 French drain placed in right abdominal abscess, removing 2700 mL of purulent fluid. 2. Splenic angiogram shows active extravasation from distal splenic branch, 2 of which were coil embolized. The branch responsible for directly supplying the pseudoaneurysm was successfully embolized distal and proximal to origin of the pseudoaneurysm. Electronically Signed   By: Miachel Roux M.D.   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 Ultrasound-guided right abdominal abscess drain placement 2. Ultrasound-guided access of right common femoral artery 3. Celiac angiogram 4. Splenic artery angiogram and distal splenic branch embolization (x2) 5. Angio-Seal closure of right common femoral artery access MEDICATIONS: Patient currently on inpatient regiment of IV Zosyn. ANESTHESIA/SEDATION: Moderate  (conscious) sedation was employed during this procedure. A total of Versed 1 mg and  Fentanyl 50 mcg was administered intravenously by the radiology nurse. Total intra-service moderate Sedation Time: 160 minutes. The patient's level of consciousness and vital signs were monitored continuously by radiology nursing throughout the procedure under my direct supervision. CONTRAST:  130 mL intra arterial Omnipaque 300 FLUOROSCOPY: Radiation Exposure Index (as provided by the fluoroscopic device): 563 mGy Kerma COMPLICATIONS: None immediate. PROCEDURE: Informed consent was obtained from the patient following explanation of the procedure, risks, benefits and alternatives. The patient understands, agrees and consents for the procedure. All questions were addressed. A time out was performed prior to the initiation of the procedure. Maximal barrier sterile technique utilized including caps, mask, sterile gowns, sterile gloves, large sterile drape, hand hygiene, and chlorhexidine prep. Right abdominal wall skin prepped and draped in usual fashion. Ultrasound evaluation showed large loculated abscess. Following local lidocaine administration, 19 gauge Yueh needle was advanced into the right abdominal collection using continuous ultrasound guidance. Yueh catheter removed over 0.035 inch guidewire, serial dilation performed, and 14.0 [place] multipurpose pigtail drain placed. 20 mL purulent sample aspirated and sent for Gram stain and culture. Total of 2700 mL of purulent fluid drained from the collection. Drain secured to skin with suture and connected to bag. Right groin skin prepped and draped usual fashion. Ultrasound image documenting patency of the right common femoral artery was obtained and placed in permanent medical record. Sterile ultrasound probe cover and gel utilized throughout the procedure. Utilizing continuous ultrasound guidance, the right common femoral artery was accessed at the level of the femoral head with a  21 gauge needle. 21 gauge needle exchanged for a transitional dilator set over 0.018 inch guidewire. Transitional dilator set exchanged for 5 French sheath over 0.035 inch guidewire. Sos omni catheter utilized to select the celiac artery. 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 Common hepatic, left gastric, and splenic arteries are patent. 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 Lantern microcatheter was advanced into the splenic artery, however the base catheter was pushed out while trying to advance the microcatheter. Sos omni catheter was exchanged for a C2 catheter. C2 catheter advanced into the splenic artery utilizing Glidewire. Splenic angiogram again showed filling of the pseudoaneurysm. Lantern microcatheter advanced further into the splenic artery and repeat angiogram was performed. There was intermittent filling of the pseudoaneurysm it was very difficult to determine which branch of the splenic artery supplied the pseudoaneurysm. Lantern microcatheter advanced into 1 of the splenic artery branches nearest the pseudoaneurysm and angiogram was performed. Although no direct filling of the pseudoaneurysm was seen, irregular vessels were identified in this area. This artery was embolized with combination of 4 mm Ruby coils. The next closest branch of the splenic artery was selected and angiogram was performed which showed flow into the pseudoaneurysm. The microcatheter was advanced beyond the origin of the pseudoaneurysm and the artery was embolized with 4 mm Ruby coils. The microcatheter was then retracted and embolization was completed with 4 mm Ruby coils. Microcatheter was removed. Splenic angiogram showed no additional flow into the pseudoaneurysm. Sheath angiogram demonstrated appropriate puncture at the level of the common femoral head. The groin was re-prepped and draped and closure was performed with 6 [place]  Angio-Seal device. IMPRESSION: 1. 14.0 French drain placed in right abdominal abscess, removing 2700 mL of purulent fluid. 2. Splenic angiogram shows active extravasation from distal splenic branch, 2 of which were coil embolized. The branch responsible for directly supplying the pseudoaneurysm was successfully embolized  distal and proximal to origin of the pseudoaneurysm. Electronically Signed   By: Miachel Roux M.D.   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 Ultrasound-guided right abdominal abscess drain placement 2. Ultrasound-guided access of right common femoral artery 3. Celiac angiogram 4. Splenic artery angiogram and distal splenic branch embolization (x2) 5. Angio-Seal closure of right common femoral artery access MEDICATIONS: Patient currently on inpatient regiment of IV Zosyn. ANESTHESIA/SEDATION: Moderate (conscious) sedation was employed during this procedure. A total of Versed 1 mg and Fentanyl 50 mcg was administered intravenously by the radiology nurse. Total intra-service moderate Sedation Time: 160 minutes. The patient's level of consciousness and vital signs were monitored continuously by radiology nursing throughout the procedure under my direct supervision. CONTRAST:  130 mL intra arterial Omnipaque 300 FLUOROSCOPY: Radiation Exposure Index (as provided by the fluoroscopic device): 778 mGy Kerma COMPLICATIONS: None immediate. PROCEDURE: Informed consent was obtained from the patient following explanation of the procedure, risks, benefits and alternatives. The patient understands, agrees and consents for the  procedure. All questions were addressed. A time out was performed prior to the initiation of the procedure. Maximal barrier sterile technique utilized including caps, mask, sterile gowns, sterile gloves, large sterile drape, hand hygiene, and chlorhexidine prep. Right abdominal wall skin prepped and draped in usual fashion. Ultrasound evaluation showed large loculated abscess. Following local lidocaine administration, 19 gauge Yueh needle was advanced into the right abdominal collection using continuous ultrasound guidance. Yueh catheter removed over 0.035 inch guidewire, serial dilation performed, and 14.0 [place] multipurpose pigtail drain placed. 20 mL purulent sample aspirated and sent for Gram stain and culture. Total of 2700 mL of purulent fluid drained from the collection. Drain secured to skin with suture and connected to bag. Right groin skin prepped and draped usual fashion. Ultrasound image documenting patency of the right common femoral artery was obtained and placed in permanent medical record. Sterile ultrasound probe cover and gel utilized throughout the procedure. Utilizing continuous ultrasound guidance, the right common femoral artery was accessed at the level of the femoral head with a 21 gauge needle. 21 gauge needle exchanged for a transitional dilator set over 0.018 inch guidewire. Transitional dilator set exchanged for 5 French sheath over 0.035 inch guidewire. Sos omni catheter utilized to select the celiac artery. 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 Common hepatic, left gastric, and splenic arteries are patent. 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 Lantern microcatheter was advanced into the splenic artery, however the base catheter was pushed out while trying to advance the microcatheter. Sos omni catheter was exchanged for a C2 catheter. C2 catheter advanced into the splenic artery utilizing  Glidewire. Splenic angiogram again showed filling of the pseudoaneurysm. Lantern microcatheter advanced further into the splenic artery and repeat angiogram was performed. There was intermittent filling of the pseudoaneurysm it was very difficult to determine which branch of the splenic artery supplied the pseudoaneurysm. Lantern microcatheter advanced into 1 of the splenic artery branches nearest the pseudoaneurysm and angiogram was performed. Although no direct filling of the pseudoaneurysm was seen, irregular vessels were identified in this area. This artery was embolized with  combination of 4 mm Ruby coils. The next closest branch of the splenic artery was selected and angiogram was performed which showed flow into the pseudoaneurysm. The microcatheter was advanced beyond the origin of the pseudoaneurysm and the artery was embolized with 4 mm Ruby coils. The microcatheter was then retracted and embolization was completed with 4 mm Ruby coils. Microcatheter was removed. Splenic angiogram showed no additional flow into the pseudoaneurysm. Sheath angiogram demonstrated appropriate puncture at the level of the common femoral head. The groin was re-prepped and draped and closure was performed with 6 [place] Angio-Seal device. IMPRESSION: 1. 14.0 French drain placed in right abdominal abscess, removing 2700 mL of purulent fluid. 2. Splenic angiogram shows active extravasation from distal splenic branch, 2 of which were coil embolized. The branch responsible for directly supplying the pseudoaneurysm was successfully embolized distal and proximal to origin of the pseudoaneurysm. Electronically Signed   By: Miachel Roux M.D.   On: 05/10/2022 11:19    Cardiac Studies   See above  Patient Profile     61 y.o. male admitted with VT storm and abdominal abscess   Assessment & Plan    VT - he had one episode yesterday on tele. Continue IV amio today, and plan to switch to oral amio tomorrow if gut is working and VT  is quiet over night. Abdominal abscess - s/p drain. 400 cc of fluid out of drain over 12 hours. Continue suction. Unclear if he will need to go home with drain in place. Hiccups - as per primary team. CAD - he denies anginal symptoms. Constipation - no bm for 5 days. He will need this addressed by primary team.     For questions or updates, please contact [place] Please consult [email or web address] for contact info under Cardiology/STEMI.      Signed, Cristopher Peru, MD  05/11/2022, 7:52 AM

## 2022-05-11 NOTE — Progress Notes (Signed)
PROGRESS NOTE    Gregory [name]  OEV:[id number] DOB: [birth year] DOA: 05/08/2022 PCP: Patient, [name]   Brief Narrative:  61 years old male with PMH significant for CAD s/p stenting, chronic systolic heart failure, recurrent VT with ICD placement, atrial fibrillation, diabetes mellitus type 2, history of seizures, hypertension presented in the ED after his AICD gave him a shock.  He felt his heart rate was elevated and then he felt like a shock so called 911.  Patient denies any chest pain or shortness of breath.  He does have history of recurrent VTE.  Patient had recent abdominal surgery , states abdominal pain has not improved.  Patient denies any discharge from his incision. He was recently hospitalized from 4/28 - 5/12 for symptomatic anemia. 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  Metastatic involving lymph nodes.  Patient underwent hand-assisted laparoscopic right colectomy on 04/18/2022 so far has received 7 units of PRBCs.  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  IR is consulted.  Patient underwent percutaneous drain and embolization of splenic artery for pseudoaneurysm.  Patient tolerated procedure well.  Patient started on IV antibiotics.  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  Active Problems:   Ileus following gastrointestinal surgery (HCC)   Hypothyroidism   Leukocytosis   Cancer of right colon (HCC)   Chronic combined systolic and diastolic CHF (congestive heart failure) (HCC)/ischemic cardiomyopathy   CAD S/P percutaneous coronary angioplasty   Paroxysmal atrial fibrillation (HCC)   Controlled type 2  diabetes mellitus without complication, without long-term current use of insulin (HCC)   Essential hypertension   Seizures (HCC)   OSA on CPAP   Recurrent VT/  s/p AICD shock: Patient with history of recurrent VT reports AICD firing, admitted after shock from device.. Patient denies any chest pain.  EKG shows normal sinus rhythm. EP physician consulted.  Patient is started on amiodarone gtt. -TSH abnormal Cardiologist states patient will continue to have episodes of V. tach until primary infection resolves. Continue amiodarone infusion.   Ileus status post gastric surgery Intra-abdominal abscess. S/p hand-assisted laparoscopic right colectomy on 04/18/22. Had post operative ileus during last hospital stay. Now presenting with abdominal pain, last BM 2 days ago and xray finding of ileus General surgery has been consulted. 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 Holding eliquis/plavix until IR intervention. Adequate pain control with oxycodone and morphine as needed for breakthrough pain General surgery discussed the case with IR for evaluation. Patient need ultrasound-guided drainage of abscess and possible embolization of the splenic artery pseudoaneurysm. Patient underwent successful percutaneous drain, and embolization of the splenic artery. Continue IV Zosyn. Patient can be discharged with drain as per surgery.  Leukocytosis: Likely secondary to intra-abdominal abscess. Lactic acid 1.7.  WBC trending up. Continue IV Zosyn.  Continue to trend WBC   Hypothyroidism Could be sick thyroid or due to amiodarone. Free T4 wnl Would repeat levels in 1-2 weeks.   Cancer of right colon [facility]) Recently diagnosed at previous hospitalization. 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   He has follow up with oncology where PET  scan is planned and discussed treatment options.   Chronic combined systolic and diastolic CHF: Echo 2/59/5638: LVEF 35-40%, worse compared to 09/22/2020 (LVEF 40-45%).  Clinically euvolemic.  Continue farxiga Metoprolol XL and Entresto is on hold with soft bp/hypotension.   Paroxysmal atrial fibrillation (HCC) Patient remains in normal sinus rhythm now. Continue amiodarone gtt. Eliquis is on hold due to intervention.  CAD S/P percutaneous coronary angioplasty S/p LHC on 04/17/22 recommendations:  Anatomy is similar to prior with the following exceptions: 50% to 70% stenosis proximal to the large first obtuse marginal.  40 to 50% stenosis proximal to the stent segment in the first diagonal. He denies any chest pain, troponin mildly elevated. Continue medical management.  Will resume Plavix once cleared from IR.   Type 2 diabetes without complication. Ruben A1c 5.1.  Well-controlled. Continue farxiga for CHF SSI and accuchecks per protocol. Sensitive while NPO     Essential hypertension Soft bp, systolic <756.  Has not had any medication in 1-2 days Hold entrestro, toprol-xl and lasix    Seizures (HCC) Continue keppra Seizure precautions    OSA on CPAP Uses cpap at night  Continue qhs    DVT prophylaxis: TED hose Code Status: Full code. Family Communication:No family at bed side. Disposition Plan:    Status is: Inpatient Remains inpatient appropriate because: Admitted for AICD shock found to have intra-abdominal abscess and pseudoaneurysm of splenic artery .  Patient underwent percutaneous drain and embolization of the splenic artery.    Consultants:  General surgery, IR,  Procedures: CT abdomen pelvis Antimicrobials:  Anti-infectives (From admission, onward)    Start     Dose/Rate Route Frequency Ordered Stop   05/08/22 2200  piperacillin-tazobactam (ZOSYN) IVPB 3.375 g        3.375 g 12.5 mL/hr over 240 Minutes  Intravenous Every 8 hours 05/08/22 2008         Subjective: Patient was seen and examined at bedside.  Overnight events noted.   Patient reports abdominal pain is improving.  He has developed hiccups and he states he has not had a bowel movement in few days.  He is able to pass flatus but has not had a bowel movement yet.  Objective: Vitals:   05/10/22 0547 05/10/22 1100 05/10/22 2003 05/11/22 0434  BP: 107/82 112/78 120/79 119/73  Pulse: 76 72 71 75  Resp: '19 18 19 18  '$ Temp: 97.8 F (36.6 C) 97.8 F (36.6 C) 97.9 F (36.6 C) 97.6 F (36.4 C)  TempSrc: Oral Oral Oral Oral  SpO2: 97% 97% 95% 99%  Weight:      Height:        Intake/Output Summary (Last 24 hours) at 05/11/2022 1359 Last data filed at 05/11/2022 0900 Gross per 24 hour  Intake 1147.99 ml  Output 1225 ml  Net -77.01 ml   Filed Weights   05/08/22 0841 05/08/22 1626  Weight: 95.7 kg 102.1 kg    Examination:  General exam: Appears comfortable, deconditioned, chronically ill looking. Respiratory system: CTA bilaterally, normal respiratory effort, RR 15 Cardiovascular system: S1-S2 heard, regular rhythm, no murmur. Gastrointestinal system: Abdomen is soft, distended, mildly tender, BS+, PCT drain noted Central nervous system: Alert and oriented X 3 . No focal neurological deficits. Extremities: Edema+,, no cyanosis, no clubbing. Skin: No rashes, lesions or ulcers Psychiatry: Judgement and insight appear normal. Mood & affect appropriate.     Data Reviewed: I have personally reviewed following labs and imaging studies  CBC: Recent Labs  Lab 05/05/22 1628  05/08/22 0918 05/09/22 0230 05/10/22 0315 05/11/22 0251  WBC 7.0 13.2* 17.9* 28.8* 28.6*  NEUTROABS 5.4 11.8*  --   --   --   HGB 12.7* 14.3 13.5 12.1* 10.7*  HCT 41.6  43.4 48.5 46.1 39.8 34.5*  MCV 81.3 86.1 82.8 82.4 82.3  PLT 599* 473* 438* 407* 761   Basic Metabolic Panel: Recent Labs  Lab 05/08/22 0918 05/09/22 0615 05/10/22 0315  05/10/22 0636 05/11/22 0251  NA 135 130* 128*  --  129*  K 5.0 4.8 5.2* 5.1 4.7  CL 104 100 98  --  99  CO2 16* 22 19*  --  22  GLUCOSE 115* 95 119*  --  127*  BUN 25* 25* 22  --  15  CREATININE 1.14 0.96 0.86  --  0.73  CALCIUM 8.3* 7.9* 7.8*  --  7.7*  MG 2.6* 2.2 2.1  --  2.1  PHOS  --   --  4.3  --  2.7   GFR: Estimated Creatinine Clearance: 122.2 mL/min (by C-G formula based on SCr of 0.73 mg/dL). Liver Function Tests: Recent Labs  Lab 05/10/22 0315 05/11/22 0251  AST 56* 70*  ALT 43 44  ALKPHOS 114 156*  BILITOT 0.7 0.5  PROT 5.7* 5.2*  ALBUMIN 1.6* <1.5*   No results for input(s): LIPASE, AMYLASE in the last 168 hours. No results for input(s): AMMONIA in the last 168 hours. Coagulation Profile: Recent Labs  Lab 05/08/22 1637 05/09/22 1325  INR 1.7* 1.6*   Cardiac Enzymes: No results for input(s): CKTOTAL, CKMB, CKMBINDEX, TROPONINI in the last 168 hours. BNP (last 3 results) No results for input(s): PROBNP in the last 8760 hours. HbA1C: Recent Labs    05/08/22 1637  HGBA1C 5.1   CBG: Recent Labs  Lab 05/10/22 1209 05/10/22 1604 05/10/22 2149 05/11/22 0818 05/11/22 1130  GLUCAP 177* 147* 106* 115* 123*   Lipid Profile: No results for input(s): CHOL, HDL, LDLCALC, TRIG, CHOLHDL, LDLDIRECT in the last 72 hours. Thyroid Function Tests: No results for input(s): TSH, T4TOTAL, FREET4, T3FREE, THYROIDAB in the last 72 hours.  Anemia Panel: No results for input(s): VITAMINB12, FOLATE, FERRITIN, TIBC, IRON, RETICCTPCT in the last 72 hours. Sepsis Labs: Recent Labs  Lab 05/08/22 1637 05/08/22 2017  LATICACIDVEN 1.9 1.3    Recent Results (from the past 240 hour(s))  Aerobic/Anaerobic Culture w Gram Stain (surgical/deep wound)     Status: None (Preliminary result)   Collection Time: 05/09/22  5:38 PM   Specimen: Abscess  Result Value Ref Range Status   Specimen Description ABSCESS  Final   Special Requests NONE  Final   Gram Stain   Final     ABUNDANT WBC PRESENT,BOTH PMN AND MONONUCLEAR FEW GRAM POSITIVE COCCI IN CLUSTERS FEW GRAM POSITIVE COCCI IN CHAINS RARE GRAM NEGATIVE RODS Performed at [facility], [place] [address] [place], [name] [address]    Culture   Final    ABUNDANT STREPTOCOCCUS ANGINOSIS MODERATE ESCHERICHIA COLI    Report Status PENDING  Incomplete   Organism ID, Bacteria ESCHERICHIA COLI  Final      Susceptibility   Escherichia coli - MIC*    AMPICILLIN 8 SENSITIVE Sensitive     CEFAZOLIN <=4 SENSITIVE Sensitive     CEFEPIME <=0.12 SENSITIVE Sensitive     CEFTAZIDIME <=1 SENSITIVE Sensitive     CEFTRIAXONE <=0.25 SENSITIVE Sensitive     CIPROFLOXACIN <=0.25 SENSITIVE Sensitive     GENTAMICIN <=1 SENSITIVE Sensitive  IMIPENEM <=0.25 SENSITIVE Sensitive     TRIMETH/SULFA <=20 SENSITIVE Sensitive     AMPICILLIN/SULBACTAM <=2 SENSITIVE Sensitive     PIP/TAZO <=4 SENSITIVE Sensitive     * MODERATE ESCHERICHIA COLI     Radiology Studies: IR Angiogram Visceral Selective  Result Date: 05/10/2022 INDICATION: 61 year old gentleman with 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 Ultrasound-guided right abdominal abscess drain placement 2. Ultrasound-guided access of right common femoral artery 3. Celiac angiogram 4. Splenic artery angiogram and distal splenic branch embolization (x2) 5. Angio-Seal closure of right common femoral artery access MEDICATIONS: Patient currently on inpatient regiment of IV Zosyn. ANESTHESIA/SEDATION: Moderate (conscious) sedation was employed during this procedure. A total of Versed 1 mg and Fentanyl 50 mcg was administered intravenously by the radiology nurse. Total intra-service moderate Sedation Time: 160 minutes. The patient's level of  consciousness and vital signs were monitored continuously by radiology nursing throughout the procedure under my direct supervision. CONTRAST:  130 mL intra arterial Omnipaque 300 FLUOROSCOPY: Radiation Exposure Index (as provided by the fluoroscopic device): 992 mGy Kerma COMPLICATIONS: None immediate. PROCEDURE: Informed consent was obtained from the patient following explanation of the procedure, risks, benefits and alternatives. The patient understands, agrees and consents for the procedure. All questions were addressed. A time out was performed prior to the initiation of the procedure. Maximal barrier sterile technique utilized including caps, mask, sterile gowns, sterile gloves, large sterile drape, hand hygiene, and chlorhexidine prep. Right abdominal wall skin prepped and draped in usual fashion. Ultrasound evaluation showed large loculated abscess. Following local lidocaine administration, 19 gauge Yueh needle was advanced into the right abdominal collection using continuous ultrasound guidance. Yueh catheter removed over 0.035 inch guidewire, serial dilation performed, and 14.0 [place] multipurpose pigtail drain placed. 20 mL purulent sample aspirated and sent for Gram stain and culture. Total of 2700 mL of purulent fluid drained from the collection. Drain secured to skin with suture and connected to bag. Right groin skin prepped and draped usual fashion. Ultrasound image documenting patency of the right common femoral artery was obtained and placed in permanent medical record. Sterile ultrasound probe cover and gel utilized throughout the procedure. Utilizing continuous ultrasound guidance, the right common femoral artery was accessed at the level of the femoral head with a 21 gauge needle. 21 gauge needle exchanged for a transitional dilator set over 0.018 inch guidewire. Transitional dilator set exchanged for 5 French sheath over 0.035 inch guidewire. Sos omni catheter utilized to select the celiac  artery. 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 Common hepatic, left gastric, and splenic arteries are patent. 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 Lantern microcatheter was advanced into the splenic artery, however the base catheter was pushed out while trying to advance the microcatheter. Sos omni catheter was exchanged for a C2 catheter. C2 catheter advanced into the splenic artery utilizing Glidewire. Splenic angiogram again showed filling of the pseudoaneurysm. Lantern microcatheter advanced further into the splenic artery and repeat angiogram was performed. There was intermittent filling of the pseudoaneurysm it was very difficult to determine which branch of the splenic artery supplied the pseudoaneurysm. Lantern microcatheter advanced into 1 of the splenic artery branches nearest the pseudoaneurysm and angiogram was performed. Although no direct filling of the pseudoaneurysm was  seen, irregular vessels were identified in this area. This artery was embolized with combination of 4 mm Ruby coils. The next closest branch of the splenic artery was selected and angiogram was performed which showed flow into the pseudoaneurysm. The microcatheter was advanced beyond the origin of the pseudoaneurysm and the artery was embolized with 4 mm Ruby coils. The microcatheter was then retracted and embolization was completed with 4 mm Ruby coils. Microcatheter was removed. Splenic angiogram showed no additional flow into the pseudoaneurysm. Sheath angiogram demonstrated appropriate puncture at the level of the common femoral head. The groin was re-prepped and draped and closure was performed with 6 [place] Angio-Seal device. IMPRESSION: 1. 14.0 French drain placed in right abdominal abscess, removing 2700 mL of purulent fluid. 2. Splenic angiogram shows active extravasation from distal splenic branch, 2 of which were coil embolized. The  branch responsible for directly supplying the pseudoaneurysm was successfully embolized distal and proximal to origin of the pseudoaneurysm. Electronically Signed   By: Miachel Roux M.D.   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 Ultrasound-guided right abdominal abscess drain placement 2. Ultrasound-guided access of right common femoral artery 3. Celiac angiogram 4. Splenic artery angiogram and distal splenic branch embolization (x2) 5. Angio-Seal closure of right common femoral artery access MEDICATIONS: Patient currently on inpatient regiment of IV Zosyn. ANESTHESIA/SEDATION: Moderate (conscious) sedation was employed during this procedure. A total of Versed 1 mg and Fentanyl 50 mcg was administered intravenously by the radiology nurse. Total intra-service moderate Sedation Time: 160 minutes. The patient's level of consciousness and vital signs were monitored continuously by radiology nursing throughout the procedure under my direct supervision. CONTRAST:  130 mL intra arterial Omnipaque 300 FLUOROSCOPY: Radiation Exposure Index (as provided by the fluoroscopic device): 229 mGy Kerma COMPLICATIONS: None immediate. PROCEDURE: Informed consent was obtained from the patient following explanation of the procedure, risks, benefits and alternatives. The patient understands, agrees and consents for the procedure. All questions were addressed. A time out was performed prior to the initiation of the procedure. Maximal barrier sterile technique utilized including caps, mask, sterile gowns, sterile gloves, large sterile drape, hand hygiene, and  chlorhexidine prep. Right abdominal wall skin prepped and draped in usual fashion. Ultrasound evaluation showed large loculated abscess. Following local lidocaine administration, 19 gauge Yueh needle was advanced into the right abdominal collection using continuous ultrasound guidance. Yueh catheter removed over 0.035 inch guidewire, serial dilation performed, and 14.0 [place] multipurpose pigtail drain placed. 20 mL purulent sample aspirated and sent for Gram stain and culture. Total of 2700 mL of purulent fluid drained from the collection. Drain secured to skin with suture and connected to bag. Right groin skin prepped and draped usual fashion. Ultrasound image documenting patency of the right common femoral artery was obtained and placed in permanent medical record. Sterile ultrasound probe cover and gel utilized throughout the procedure. Utilizing continuous ultrasound guidance, the right common femoral artery was accessed at the level of the femoral head with a 21 gauge needle. 21 gauge needle exchanged for a transitional dilator set over 0.018 inch guidewire. Transitional dilator set exchanged for 5 French sheath over 0.035 inch guidewire. Sos omni catheter utilized to select the celiac artery.  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 Common hepatic, left gastric, and splenic arteries are patent. 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 Lantern microcatheter was advanced into the splenic artery, however the base catheter was pushed out while trying to advance the microcatheter. Sos omni catheter was exchanged for a C2 catheter. C2 catheter advanced into the splenic artery utilizing Glidewire. Splenic angiogram again showed filling of the pseudoaneurysm. Lantern microcatheter advanced further into the splenic artery and repeat angiogram was performed. There was intermittent filling of the pseudoaneurysm it was very difficult to  determine which branch of the splenic artery supplied the pseudoaneurysm. Lantern microcatheter advanced into 1 of the splenic artery branches nearest the pseudoaneurysm and angiogram was performed. Although no direct filling of the pseudoaneurysm was seen, irregular vessels were identified in this area. This artery was embolized with combination of 4 mm Ruby coils. The next closest branch of the splenic artery was selected and angiogram was performed which showed flow into the pseudoaneurysm. The microcatheter was advanced beyond the origin of the pseudoaneurysm and the artery was embolized with 4 mm Ruby coils. The microcatheter was then retracted and embolization was completed with 4 mm Ruby coils. Microcatheter was removed. Splenic angiogram showed no additional flow into the pseudoaneurysm. Sheath angiogram demonstrated appropriate puncture at the level of the common femoral head. The groin was re-prepped and draped and closure was performed with 6 [place] Angio-Seal device. IMPRESSION: 1. 14.0 French drain placed in right abdominal abscess, removing 2700 mL of purulent fluid. 2. Splenic angiogram shows active extravasation from distal splenic branch, 2 of which were coil embolized. The branch responsible for directly supplying the pseudoaneurysm was successfully embolized distal and proximal to origin of the pseudoaneurysm. Electronically Signed   By: Miachel Roux M.D.   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 Ultrasound-guided right abdominal abscess drain placement 2. Ultrasound-guided access of right common femoral artery 3. Celiac angiogram 4.  Splenic artery angiogram and distal splenic branch embolization (x2) 5. Angio-Seal closure of right common femoral artery access MEDICATIONS: Patient currently on inpatient regiment of IV Zosyn. ANESTHESIA/SEDATION: Moderate (conscious) sedation was employed during this procedure. A total of Versed 1 mg and Fentanyl 50 mcg was administered intravenously by the radiology nurse. Total intra-service moderate Sedation Time: 160 minutes. The patient's level of consciousness and vital signs were monitored continuously by radiology nursing throughout the procedure under my direct supervision. CONTRAST:  130 mL intra arterial Omnipaque 300 FLUOROSCOPY: Radiation Exposure Index (as provided by the fluoroscopic device): 025 mGy Kerma COMPLICATIONS: None immediate. PROCEDURE: Informed consent was obtained from the patient following explanation of the procedure, risks, benefits and alternatives. The patient understands, agrees and consents for the procedure. All questions were addressed. A time out was performed prior to the initiation of the procedure. Maximal barrier sterile technique utilized including caps, mask, sterile gowns, sterile gloves, large sterile drape, hand hygiene, and chlorhexidine prep. Right abdominal wall skin prepped and draped in usual fashion. Ultrasound evaluation showed large loculated abscess. Following local lidocaine administration, 19 gauge Yueh needle was advanced into the right abdominal collection using continuous ultrasound guidance. Yueh catheter removed  over 0.035 inch guidewire, serial dilation performed, and 14.0 [place] multipurpose pigtail drain placed. 20 mL purulent sample aspirated and sent for Gram stain and culture. Total of 2700 mL of purulent fluid drained from the collection. Drain secured to skin with suture and connected to bag. Right groin skin prepped and draped usual fashion. Ultrasound image documenting patency of the right common femoral artery was obtained and placed in  permanent medical record. Sterile ultrasound probe cover and gel utilized throughout the procedure. Utilizing continuous ultrasound guidance, the right common femoral artery was accessed at the level of the femoral head with a 21 gauge needle. 21 gauge needle exchanged for a transitional dilator set over 0.018 inch guidewire. Transitional dilator set exchanged for 5 French sheath over 0.035 inch guidewire. Sos omni catheter utilized to select the celiac artery. 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 Common hepatic, left gastric, and splenic arteries are patent. 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 Lantern microcatheter was advanced into the splenic artery, however the base catheter was pushed out while trying to advance the microcatheter. Sos omni catheter was exchanged for a C2 catheter. C2 catheter advanced into the splenic artery utilizing Glidewire. Splenic angiogram again showed filling of the pseudoaneurysm. Lantern microcatheter advanced further into the splenic artery and repeat angiogram was performed. There was intermittent filling of the pseudoaneurysm it was very difficult to determine which branch of the splenic artery supplied the pseudoaneurysm. Lantern microcatheter advanced into 1 of the splenic artery branches nearest the pseudoaneurysm and angiogram was performed. Although no direct filling of the pseudoaneurysm was seen, irregular vessels were identified in this area. This artery was embolized with combination of 4 mm Ruby coils. The next closest branch of the splenic artery was selected and angiogram was performed which showed flow into the pseudoaneurysm. The microcatheter was advanced beyond the origin of the pseudoaneurysm and the artery was embolized with 4 mm Ruby coils. The microcatheter was then retracted and embolization was completed with 4 mm Ruby coils. Microcatheter was removed. Splenic  angiogram showed no additional flow into the pseudoaneurysm. Sheath angiogram demonstrated appropriate puncture at the level of the common femoral head. The groin was re-prepped and draped and closure was performed with 6 [place] Angio-Seal device. IMPRESSION: 1. 14.0 French drain placed in right abdominal abscess, removing 2700 mL of purulent fluid. 2. Splenic angiogram shows active extravasation from distal splenic branch, 2 of which were coil embolized. The branch responsible for directly supplying the pseudoaneurysm was successfully embolized distal and proximal to origin of the pseudoaneurysm. Electronically Signed   By: Miachel Roux M.D.   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 Ultrasound-guided right abdominal abscess drain placement 2. Ultrasound-guided access of right common femoral artery 3. Celiac angiogram 4. Splenic artery angiogram and distal splenic branch embolization (x2) 5. Angio-Seal closure of right common femoral artery access MEDICATIONS: Patient currently on inpatient regiment of IV Zosyn. ANESTHESIA/SEDATION: Moderate (conscious) sedation was employed during this procedure. A total of Versed 1 mg and Fentanyl 50 mcg was administered intravenously by the radiology nurse. Total intra-service moderate Sedation Time: 160  minutes. The patient's level of consciousness and vital signs were monitored continuously by radiology nursing throughout the procedure under my direct supervision. CONTRAST:  130 mL intra arterial Omnipaque 300 FLUOROSCOPY: Radiation Exposure Index (as provided by the fluoroscopic device):  709 mGy Kerma COMPLICATIONS: None immediate. PROCEDURE: Informed consent was obtained from the patient following explanation of the procedure, risks, benefits and alternatives. The patient understands, agrees and consents for the procedure. All questions were addressed. A time out was performed prior to the initiation of the procedure. Maximal barrier sterile technique utilized including caps, mask, sterile gowns, sterile gloves, large sterile drape, hand hygiene, and chlorhexidine prep. Right abdominal wall skin prepped and draped in usual fashion. Ultrasound evaluation showed large loculated abscess. Following local lidocaine administration, 19 gauge Yueh needle was advanced into the right abdominal collection using continuous ultrasound guidance. Yueh catheter removed over 0.035 inch guidewire, serial dilation performed, and 14.0 [place] multipurpose pigtail drain placed. 20 mL purulent sample aspirated and sent for Gram stain and culture. Total of 2700 mL of purulent fluid drained from the collection. Drain secured to skin with suture and connected to bag. Right groin skin prepped and draped usual fashion. Ultrasound image documenting patency of the right common femoral artery was obtained and placed in permanent medical record. Sterile ultrasound probe cover and gel utilized throughout the procedure. Utilizing continuous ultrasound guidance, the right common femoral artery was accessed at the level of the femoral head with a 21 gauge needle. 21 gauge needle exchanged for a transitional dilator set over 0.018 inch guidewire. Transitional dilator set exchanged for 5 French sheath over 0.035 inch guidewire. Sos omni catheter utilized to select the celiac artery. 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 Common hepatic, left gastric, and splenic arteries are patent. 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 Lantern microcatheter was  advanced into the splenic artery, however the base catheter was pushed out while trying to advance the microcatheter. Sos omni catheter was exchanged for a C2 catheter. C2 catheter advanced into the splenic artery utilizing Glidewire. Splenic angiogram again showed filling of the pseudoaneurysm. Lantern microcatheter advanced further into the splenic artery and repeat angiogram was performed. There was intermittent filling of the pseudoaneurysm it was very difficult to determine which branch of the splenic artery supplied the pseudoaneurysm. Lantern microcatheter advanced into 1 of the splenic artery branches nearest the pseudoaneurysm and angiogram was performed. Although no direct filling of the pseudoaneurysm was seen, irregular vessels were identified in this area. This artery was embolized with combination of 4 mm Ruby coils. The next closest branch of the splenic artery was selected and angiogram was performed which showed flow into the pseudoaneurysm. The microcatheter was advanced beyond the origin of the pseudoaneurysm and the artery was embolized with 4 mm Ruby coils. The microcatheter was then retracted and embolization was completed with 4 mm Ruby coils. Microcatheter was removed. Splenic angiogram showed no additional flow into the pseudoaneurysm. Sheath angiogram demonstrated appropriate puncture at the level of the common femoral head. The groin was re-prepped and draped and closure was performed with 6 [place] Angio-Seal device. IMPRESSION: 1. 14.0 French drain placed in right abdominal abscess, removing 2700 mL of purulent fluid. 2. Splenic angiogram shows active extravasation from distal splenic branch, 2 of which were coil embolized. The branch responsible for directly supplying the pseudoaneurysm was successfully embolized distal and proximal to origin of the pseudoaneurysm. Electronically Signed   By: Sharen Heck  Mir  M.D.   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 Ultrasound-guided right abdominal abscess drain placement 2. Ultrasound-guided access of right common femoral artery 3. Celiac angiogram 4. Splenic artery angiogram and distal splenic branch embolization (x2) 5. Angio-Seal closure of right common femoral artery access MEDICATIONS: Patient currently on inpatient regiment of IV Zosyn. ANESTHESIA/SEDATION: Moderate (conscious) sedation was employed during this procedure. A total of Versed 1 mg and Fentanyl 50 mcg was administered intravenously by the radiology nurse. Total intra-service moderate Sedation Time: 160 minutes. The patient's level of consciousness and vital signs were monitored continuously by radiology nursing throughout the procedure under my direct supervision. CONTRAST:  130 mL intra arterial Omnipaque 300 FLUOROSCOPY: Radiation Exposure Index (as provided by the fluoroscopic device): 354 mGy Kerma COMPLICATIONS: None immediate. PROCEDURE: Informed consent was obtained from the patient following explanation of the procedure, risks, benefits and alternatives. The patient understands, agrees and consents for the procedure. All questions were addressed. A time out was performed prior to the initiation of the procedure. Maximal barrier sterile technique utilized including caps, mask, sterile gowns, sterile gloves, large sterile drape, hand hygiene, and chlorhexidine prep. Right abdominal wall skin prepped and draped in usual fashion. Ultrasound evaluation showed large loculated abscess. Following local lidocaine administration, 19 gauge Yueh needle was advanced into the right abdominal collection using  continuous ultrasound guidance. Yueh catheter removed over 0.035 inch guidewire, serial dilation performed, and 14.0 [place] multipurpose pigtail drain placed. 20 mL purulent sample aspirated and sent for Gram stain and culture. Total of 2700 mL of purulent fluid drained from the collection. Drain secured to skin with suture and connected to bag. Right groin skin prepped and draped usual fashion. Ultrasound image documenting patency of the right common femoral artery was obtained and placed in permanent medical record. Sterile ultrasound probe cover and gel utilized throughout the procedure. Utilizing continuous ultrasound guidance, the right common femoral artery was accessed at the level of the femoral head with a 21 gauge needle. 21 gauge needle exchanged for a transitional dilator set over 0.018 inch guidewire. Transitional dilator set exchanged for 5 French sheath over 0.035 inch guidewire. Sos omni catheter utilized to select the celiac artery. 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 Common hepatic, left gastric, and splenic arteries are patent. 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 Lantern microcatheter was advanced into the splenic artery, however the base catheter was pushed out while trying to advance the microcatheter. Sos omni catheter was exchanged for a C2 catheter. C2 catheter advanced into the splenic artery utilizing Glidewire. Splenic angiogram again showed filling of the pseudoaneurysm. Lantern microcatheter advanced further into the splenic artery and repeat angiogram was performed. There was intermittent filling of the pseudoaneurysm it was very difficult to determine which branch of the splenic artery supplied the pseudoaneurysm. Lantern microcatheter advanced into 1 of the splenic artery branches nearest the pseudoaneurysm and angiogram was performed. Although no direct filling of the pseudoaneurysm was  seen, irregular vessels were identified in this area. This artery was embolized with combination of 4 mm Ruby coils. The next closest branch of the splenic artery was  selected and angiogram was performed which showed flow into the pseudoaneurysm. The microcatheter was advanced beyond the origin of the pseudoaneurysm and the artery was embolized with 4 mm Ruby coils. The microcatheter was then retracted and embolization was completed with 4 mm Ruby coils. Microcatheter was removed. Splenic angiogram showed no additional flow into the pseudoaneurysm. Sheath angiogram demonstrated appropriate puncture at the level of the common femoral head. The groin was re-prepped and draped and closure was performed with 6 [place] Angio-Seal device. IMPRESSION: 1. 14.0 French drain placed in right abdominal abscess, removing 2700 mL of purulent fluid. 2. Splenic angiogram shows active extravasation from distal splenic branch, 2 of which were coil embolized. The branch responsible for directly supplying the pseudoaneurysm was successfully embolized distal and proximal to origin of the pseudoaneurysm. Electronically Signed   By: Miachel Roux M.D.   On: 05/10/2022 11:19    Scheduled Meds:  apixaban  5 mg Oral BID   atorvastatin  80 mg Oral QPM   dapagliflozin propanediol  10 mg Oral Daily   docusate sodium  100 mg Oral BID   ezetimibe  10 mg Oral Daily   insulin aspart  0-9 Units Subcutaneous TID WC   levETIRAcetam  500 mg Oral BID   pantoprazole  40 mg Oral BID   polyethylene glycol  17 g Oral Daily   sodium chloride flush  3 mL Intravenous Q12H   sodium chloride flush  5 mL Intracatheter Q8H   Continuous Infusions:  sodium chloride     amiodarone 30 mg/hr (05/11/22 0721)   piperacillin-tazobactam (ZOSYN)  IV 3.375 g (05/11/22 0822)     LOS: 3 days    Time spent: 35 mins    Wana Mount, MD [facility]   If 7PM-7AM, please contact night-coverage

## 2022-05-11 NOTE — Progress Notes (Signed)
RT Note: CPAP set up at bedside with FFM. Patient states he is able to place himself on/off as needed. Patient aware to call for assistance if needed. RT will monitor as needed.

## 2022-05-12 ENCOUNTER — Inpatient Hospital Stay (HOSPITAL_COMMUNITY): Payer: Commercial Managed Care - HMO

## 2022-05-12 DIAGNOSIS — M7989 Other specified soft tissue disorders: Secondary | ICD-10-CM

## 2022-05-12 DIAGNOSIS — I472 Ventricular tachycardia, unspecified: Secondary | ICD-10-CM | POA: Diagnosis not present

## 2022-05-12 LAB — CBC
HCT: 34.8 % — ABNORMAL LOW (ref 39.0–52.0)
Hemoglobin: 10.4 g/dL — ABNORMAL LOW (ref 13.0–17.0)
MCH: 24.7 pg — ABNORMAL LOW (ref 26.0–34.0)
MCHC: 29.9 g/dL — ABNORMAL LOW (ref 30.0–36.0)
MCV: 82.7 fL (ref 80.0–100.0)
Platelets: 349 10*3/uL (ref 150–400)
RBC: 4.21 MIL/uL — ABNORMAL LOW (ref 4.22–5.81)
RDW: 22.4 % — ABNORMAL HIGH (ref 11.5–15.5)
WBC: 22.1 10*3/uL — ABNORMAL HIGH (ref 4.0–10.5)
nRBC: 0 % (ref 0.0–0.2)

## 2022-05-12 LAB — COMPREHENSIVE METABOLIC PANEL
ALT: 52 U/L — ABNORMAL HIGH (ref 0–44)
AST: 80 U/L — ABNORMAL HIGH (ref 15–41)
Albumin: <1.5 g/dL — ABNORMAL LOW (ref 3.5–5.0)
Alkaline Phosphatase: 174 U/L — ABNORMAL HIGH (ref 38–126)
Anion gap: 7 (ref 5–15)
BUN: 9 mg/dL (ref 8–23)
CO2: 26 mmol/L (ref 22–32)
Calcium: 7.8 mg/dL — ABNORMAL LOW (ref 8.9–10.3)
Chloride: 98 mmol/L (ref 98–111)
Creatinine, Ser: 0.71 mg/dL (ref 0.61–1.24)
GFR, Estimated: >60 mL/min (ref >60)
Glucose, Bld: 108 mg/dL — ABNORMAL HIGH (ref 70–99)
Potassium: 4.4 mmol/L (ref 3.5–5.1)
Sodium: 131 mmol/L — ABNORMAL LOW (ref 135–145)
Total Bilirubin: 0.3 mg/dL (ref 0.3–1.2)
Total Protein: 5.4 g/dL — ABNORMAL LOW (ref 6.5–8.1)

## 2022-05-12 LAB — GLUCOSE, CAPILLARY
Glucose-Capillary: 97 mg/dL (ref 70–99)
Glucose-Capillary: 121 mg/dL — ABNORMAL HIGH (ref 70–99)
Glucose-Capillary: 106 mg/dL — ABNORMAL HIGH (ref 70–99)

## 2022-05-12 MED ORDER — IOHEXOL 9 MG/ML PO SOLN
ORAL | Status: AC
Start: 2022-05-12 — End: 2022-05-12
  Administered 2022-05-12: 500 mL
  Filled 2022-05-12: qty 1000

## 2022-05-12 MED ORDER — IOHEXOL 300 MG/ML  SOLN
100.0000 mL | Freq: Once | INTRAMUSCULAR | Status: AC | PRN
Start: 2022-05-12 — End: 2022-05-12
  Administered 2022-05-12: 100 mL via INTRAVENOUS

## 2022-05-12 NOTE — Progress Notes (Signed)
Progress Note  Patient Name: Gregory [name] Date of Encounter: 05/12/2022  Primary Cardiologist: Sanda Klein, MD   Subjective   No chest pain. Still some abdominal discomfort.   Inpatient Medications    Scheduled Meds:  apixaban  5 mg Oral BID   atorvastatin  80 mg Oral QPM   dapagliflozin propanediol  10 mg Oral Daily   docusate sodium  100 mg Oral BID   ezetimibe  10 mg Oral Daily   insulin aspart  0-9 Units Subcutaneous TID WC   levETIRAcetam  500 mg Oral BID   pantoprazole  40 mg Oral BID   polyethylene glycol  17 g Oral Daily   sodium chloride flush  3 mL Intravenous Q12H   sodium chloride flush  5 mL Intracatheter Q8H   Continuous Infusions:  sodium chloride     amiodarone 30 mg/hr (05/12/22 0852)   piperacillin-tazobactam (ZOSYN)  IV 3.375 g (05/12/22 0845)   PRN Meds: sodium chloride, acetaminophen **OR** acetaminophen, HYDROmorphone (DILAUDID) injection, oxyCODONE, sodium chloride flush   Vital Signs    Vitals:   05/11/22 1933 05/11/22 2052 05/12/22 0500 05/12/22 0804  BP: 127/72  120/75 120/80  Pulse: 71 77 77 71  Resp: '18 18 19 18  '$ Temp: 98.9 F (37.2 C)  97.9 F (36.6 C)   TempSrc: Oral  Oral   SpO2: 95% 96% 95% 98%  Weight:      Height:        Intake/Output Summary (Last 24 hours) at 05/12/2022 0853 Last data filed at 05/12/2022 0600 Gross per 24 hour  Intake 1573.34 ml  Output 945 ml  Net 628.34 ml   Filed Weights   05/08/22 0841 05/08/22 1626  Weight: 95.7 kg 102.1 kg    Telemetry    NSR, rare PVC's - Personally Reviewed  ECG    none - Personally Reviewed  Physical Exam   GEN: No acute distress.   Neck: No JVD Cardiac: RRR, no murmurs, rubs, or gallops.  Respiratory: Clear to auscultation bilaterally. GI: Soft, nontender, minimally distended  MS: No edema; No deformity. Neuro:  Nonfocal  Psych: Normal affect   Labs    Chemistry Recent Labs  Lab 05/10/22 0315 05/10/22 0636 05/11/22 0251 05/12/22 0116   NA 128*  --  129* 131*  K 5.2* 5.1 4.7 4.4  CL 98  --  99 98  CO2 19*  --  22 26  GLUCOSE 119*  --  127* 108*  BUN 22  --  15 9  CREATININE 0.86  --  0.73 0.71  CALCIUM 7.8*  --  7.7* 7.8*  PROT 5.7*  --  5.2* 5.4*  ALBUMIN 1.6*  --  <1.5* <1.5*  AST 56*  --  70* 80*  ALT 43  --  44 52*  ALKPHOS 114  --  156* 174*  BILITOT 0.7  --  0.5 0.3  GFRNONAA >60  --  >60 >60  ANIONGAP 11  --  8 7     Hematology Recent Labs  Lab 05/10/22 0315 05/11/22 0251 05/12/22 0116  WBC 28.8* 28.6* 22.1*  RBC 4.83 4.19* 4.21*  HGB 12.1* 10.7* 10.4*  HCT 39.8 34.5* 34.8*  MCV 82.4 82.3 82.7  MCH 25.1* 25.5* 24.7*  MCHC 30.4 31.0 29.9*  RDW 24.0* 22.5* 22.4*  PLT 407* 337 349    Cardiac EnzymesNo results for input(s): TROPONINI in the last 168 hours. No results for input(s): TROPIPOC in the last 168 hours.   BNPNo results  for input(s): BNP, PROBNP in the last 168 hours.   DDimer No results for input(s): DDIMER in the last 168 hours.   Radiology    No results found.  Cardiac Studies   none  Patient Profile     61 y.o. male admitted with VT storm and abdominal abscess  Assessment & Plan    VT storm - his VT has been quiet in last 24 hours. If no additional procedures required for belly and able to take oral intake, we will transition to oral amio. Abdominal abscess - note plans for additional imaging. He continues to drain purulent material. CAD - he denies anginal symptoms. Constipation - still no BM in 6 days.       For questions or updates, please contact [place] Please consult [email or web address] for contact info under Cardiology/STEMI.      Signed, Cristopher Peru, MD  05/12/2022, 8:53 AM

## 2022-05-12 NOTE — Progress Notes (Signed)
PROGRESS NOTE    Gregory [name]  XBD:[id number] DOB: 03-08-1961 DOA: 05/08/2022 PCP: Patient, [name]   Brief Narrative:  61 years old male with PMH significant for CAD s/p stenting, chronic systolic heart failure, recurrent VT with ICD placement, atrial fibrillation, diabetes mellitus type 2, history of seizures, hypertension presented in the ED after his AICD gave him a shock.  He felt his heart rate was elevated and then he felt like a shock so called 911.  Patient denies any chest pain or shortness of breath.  He does have history of recurrent VTE.  Patient had recent abdominal surgery , states abdominal pain has not improved.  Patient denies any discharge from his incision. He was recently hospitalized from 4/28 - 5/12 for symptomatic anemia. 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  Metastatic involving lymph nodes.  Patient underwent hand-assisted laparoscopic right colectomy on 04/18/2022 so far has received 7 units of PRBCs.  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  IR is consulted.  Patient underwent percutaneous drain and embolization of splenic artery for pseudoaneurysm.  Patient tolerated procedure well.  Patient started on IV antibiotics.  Patient continued to have elevated white cell count despite being on antibiotics General surgery recommended repeat CT abdomen pelvis.  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  Active Problems:   Ileus following gastrointestinal surgery (HCC)   Hypothyroidism   Leukocytosis   Cancer of right colon (HCC)   Chronic combined systolic and diastolic CHF (congestive heart failure)  (HCC)/ischemic cardiomyopathy   CAD S/P percutaneous coronary angioplasty   Paroxysmal atrial fibrillation (HCC)   Controlled type 2 diabetes mellitus without complication, without long-term current use of insulin (HCC)   Essential hypertension   Seizures (HCC)   OSA on CPAP  Recurrent VT/  s/p AICD shock: Patient with history of recurrent VT reports AICD firing, admitted after shock from device.. Patient denies any chest pain.  EKG shows normal sinus rhythm. EP physician consulted.  Patient is started on amiodarone gtt. -TSH abnormal Cardiologist states patient will continue to have episodes of V. tach until primary infection resolves. Continued on amiodarone infusion. Plan is to switch to PO amiodarone today.   Ileus status post gastric surgery: Intra-abdominal abscess. S/p hand-assisted laparoscopic right colectomy on 04/18/22. Had post operative ileus during last hospital stay. Now presenting with abdominal pain, last BM 2 days ago and xray finding of ileus General surgery has been consulted. 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 Held  eliquis/plavix until IR intervention. Adequate pain control with oxycodone and morphine as needed for breakthrough pain. General surgery discussed the case with IR for evaluation. Patient need ultrasound-guided drainage of abscess and possible embolization of the splenic artery pseudoaneurysm. Patient underwent successful percutaneous drain, and embolization of the splenic artery. Continue IV Zosyn for now.  Continue to have high white cell count despite being on antibiotic. General surgery recommended repeat CT abdomen pelvis to look for other abscesses or pathology.  Leukocytosis: Likely secondary to intra-abdominal abscess. Lactic acid 1.7. WBC trending down but still elevated. Continue IV Zosyn.  Continue to trend WBC   Hypothyroidism:  Could be sick thyroid or due to amiodarone. Free T4 wnl Would repeat  levels in  1-2 weeks.   Cancer of right colon [facility]) Recently diagnosed at previous hospitalization. 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  He has follow up with oncology where PET scan is planned and discuss treatment options.   Chronic combined systolic and diastolic CHF: Echo 0/09/9322: LVEF 35-40%, worse compared to 09/22/2020 (LVEF 40-45%).  Clinically euvolemic.  Continue farxiga Metoprolol XL and Entresto is on hold with soft bp/hypotension.   Paroxysmal atrial fibrillation (HCC) Patient remains in normal sinus rhythm now. Amiodarone gtt. changed to p.o. amiodarone today. Eliquis resumed.  Continue for anticoagulation.  CAD S/P percutaneous coronary angioplasty S/p LHC on 04/17/22 recommendations:  Anatomy is similar to prior with the following exceptions: 50% to 70% stenosis proximal to the large first obtuse marginal.  40 to 50% stenosis proximal to the stent segment in the first diagonal. He denies any chest pain, troponin mildly elevated. Continue medical management.  Resume Plavix.   Type 2 diabetes without complication. Hb A1c 5.1.  Well-controlled. Continue farxiga for CHF SSI and accuchecks per protocol. S    Essential hypertension Soft bp, systolic <557.  Has not had any medication in 1-2 days Hold entrestro, toprol-xl and lasix    Seizures (HCC) Continue keppra. Seizure precautions    OSA on CPAP Uses cpap at night  Continue qhs.  Lt. leg swelling: Obtain venous duplex to rule out DVT.   DVT prophylaxis: TED hose Code Status: Full code. Family Communication:No family at bed side. Disposition Plan:    Status is: Inpatient Remains inpatient appropriate because: Admitted for AICD shock found to have intra-abdominal abscess and pseudoaneurysm of splenic artery .  Patient underwent  percutaneous drain and embolization of the splenic artery.  General surgery recommends repeat CT abdomen to rule out other pathology.    Consultants:  General surgery, IR,  Procedures: CT abdomen pelvis Antimicrobials:  Anti-infectives (From admission, onward)    Start     Dose/Rate Route Frequency Ordered Stop   05/08/22 2200  piperacillin-tazobactam (ZOSYN) IVPB 3.375 g        3.375 g 12.5 mL/hr over 240 Minutes Intravenous Every 8 hours 05/08/22 2008         Subjective: Patient was seen and examined at bedside.  Overnight events noted.   Patient reports still having abdominal pain.  Hiccups is improved. He has not had a bowel movement yet.  He refuses to get enema or suppository.   He wants to try oral constipation medications.  He complains of left leg swelling.   Objective: Vitals:   05/11/22 2052 05/12/22 0500 05/12/22 0804 05/12/22 1050  BP:  120/75 120/80 111/70  Pulse: 77 77 71 76  Resp: '18 19 18 18  '$ Temp:  97.9 F (36.6 C)  98.9 F (37.2 C)  TempSrc:  Oral  Oral  SpO2: 96% 95% 98% 93%  Weight:      Height:        Intake/Output Summary (Last 24 hours) at 05/12/2022 1126 Last data filed at 05/12/2022 1051 Gross per 24 hour  Intake 1568.34 ml  Output 1495 ml  Net 73.34 ml   Filed Weights   05/08/22 0841 05/08/22 1626  Weight: 95.7 kg 102.1 kg    Examination:  General exam: Appears comfortable, chronically ill looking, deconditioned. Respiratory system: CTA bilaterally, Normal respiratory effort, RR 16. Cardiovascular system: S1-S2 heard, regular rate and rhythm, no murmur. Gastrointestinal system:  Abdomen is soft, distended, mildly tender, normal bowel sounds, PCT drain noted. Central nervous system: Alert and oriented X 3 . No focal neurological deficits. Extremities: Edema+, no cyanosis, no clubbing.  Left leg swollen, nontender. Skin: No rashes, lesions or ulcers Psychiatry: Judgement and insight appear normal. Mood & affect appropriate.      Data Reviewed: I have personally reviewed following labs and imaging studies  CBC: Recent Labs  Lab 05/05/22 1628 05/08/22 0918 05/09/22 0230 05/10/22 0315 05/11/22 0251 05/12/22 0116  WBC 7.0 13.2* 17.9* 28.8* 28.6* 22.1*  NEUTROABS 5.4 11.8*  --   --   --   --   HGB 12.7* 14.3 13.5 12.1* 10.7* 10.4*  HCT 41.6  43.4 48.5 46.1 39.8 34.5* 34.8*  MCV 81.3 86.1 82.8 82.4 82.3 82.7  PLT 599* 473* 438* 407* 337 956   Basic Metabolic Panel: Recent Labs  Lab 05/08/22 0918 05/09/22 0615 05/10/22 0315 05/10/22 0636 05/11/22 0251 05/12/22 0116  NA 135 130* 128*  --  129* 131*  K 5.0 4.8 5.2* 5.1 4.7 4.4  CL 104 100 98  --  99 98  CO2 16* 22 19*  --  22 26  GLUCOSE 115* 95 119*  --  127* 108*  BUN 25* 25* 22  --  15 9  CREATININE 1.14 0.96 0.86  --  0.73 0.71  CALCIUM 8.3* 7.9* 7.8*  --  7.7* 7.8*  MG 2.6* 2.2 2.1  --  2.1  --   PHOS  --   --  4.3  --  2.7  --    GFR: Estimated Creatinine Clearance: 122.2 mL/min (by C-G formula based on SCr of 0.71 mg/dL). Liver Function Tests: Recent Labs  Lab 05/10/22 0315 05/11/22 0251 05/12/22 0116  AST 56* 70* 80*  ALT 43 44 52*  ALKPHOS 114 156* 174*  BILITOT 0.7 0.5 0.3  PROT 5.7* 5.2* 5.4*  ALBUMIN 1.6* <1.5* <1.5*   No results for input(s): LIPASE, AMYLASE in the last 168 hours. No results for input(s): AMMONIA in the last 168 hours. Coagulation Profile: Recent Labs  Lab 05/08/22 1637 05/09/22 1325  INR 1.7* 1.6*   Cardiac Enzymes: No results for input(s): CKTOTAL, CKMB, CKMBINDEX, TROPONINI in the last 168 hours. BNP (last 3 results) No results for input(s): PROBNP in the last 8760 hours. HbA1C: No results for input(s): HGBA1C in the last 72 hours.  CBG: Recent Labs  Lab 05/11/22 0818 05/11/22 1130 05/11/22 1635 05/11/22 2149 05/12/22 0801  GLUCAP 115* 123* 223* 109* 97   Lipid Profile: No results for input(s): CHOL, HDL, LDLCALC, TRIG, CHOLHDL, LDLDIRECT in the last 72 hours. Thyroid Function  Tests: No results for input(s): TSH, T4TOTAL, FREET4, T3FREE, THYROIDAB in the last 72 hours.  Anemia Panel: No results for input(s): VITAMINB12, FOLATE, FERRITIN, TIBC, IRON, RETICCTPCT in the last 72 hours. Sepsis Labs: Recent Labs  Lab 05/08/22 1637 05/08/22 2017  LATICACIDVEN 1.9 1.3    Recent Results (from the past 240 hour(s))  Aerobic/Anaerobic Culture w Gram Stain (surgical/deep wound)     Status: None (Preliminary result)   Collection Time: 05/09/22  5:38 PM   Specimen: Abscess  Result Value Ref Range Status   Specimen Description ABSCESS  Final   Special Requests NONE  Final   Gram Stain   Final    ABUNDANT WBC PRESENT,BOTH PMN AND MONONUCLEAR FEW GRAM POSITIVE COCCI IN CLUSTERS FEW GRAM POSITIVE COCCI IN CHAINS RARE GRAM NEGATIVE RODS    Culture   Final  ABUNDANT STREPTOCOCCUS ANGINOSIS MODERATE ESCHERICHIA COLI HOLDING FOR POSSIBLE ANAEROBE Performed at [facility], [place] [address], [place], [place] [address]    Report Status PENDING  Incomplete   Organism ID, Bacteria ESCHERICHIA COLI  Final      Susceptibility   Escherichia coli - MIC*    AMPICILLIN 8 SENSITIVE Sensitive     CEFAZOLIN <=4 SENSITIVE Sensitive     CEFEPIME <=0.12 SENSITIVE Sensitive     CEFTAZIDIME <=1 SENSITIVE Sensitive     CEFTRIAXONE <=0.25 SENSITIVE Sensitive     CIPROFLOXACIN <=0.25 SENSITIVE Sensitive     GENTAMICIN <=1 SENSITIVE Sensitive     IMIPENEM <=0.25 SENSITIVE Sensitive     TRIMETH/SULFA <=20 SENSITIVE Sensitive     AMPICILLIN/SULBACTAM <=2 SENSITIVE Sensitive     PIP/TAZO <=4 SENSITIVE Sensitive     * MODERATE ESCHERICHIA COLI     Radiology Studies: No results found.  Scheduled Meds:  apixaban  5 mg Oral BID   atorvastatin  80 mg Oral QPM   dapagliflozin propanediol  10 mg Oral Daily   docusate sodium  100 mg Oral BID   ezetimibe  10 mg Oral Daily   insulin aspart  0-9 Units Subcutaneous TID WC   levETIRAcetam  500 mg Oral BID   pantoprazole  40 mg Oral  BID   polyethylene glycol  17 g Oral Daily   sodium chloride flush  3 mL Intravenous Q12H   sodium chloride flush  5 mL Intracatheter Q8H   Continuous Infusions:  sodium chloride     amiodarone 30 mg/hr (05/12/22 0852)   piperacillin-tazobactam (ZOSYN)  IV 3.375 g (05/12/22 0845)     LOS: 4 days    Time spent: 35 mins    Deshannon Seide, MD [facility]   If 7PM-7AM, please contact night-coverage

## 2022-05-12 NOTE — Progress Notes (Signed)
Mobility Specialist Progress Note    05/12/22 1702  Mobility  Activity Refused mobility   Pt stated he was tired and just got done going to the [place].   Hildred Alamin Mobility Specialist  Primary: 5N M.S. Phone: [phone] Secondary: 6N M.S. Phone: [phone]

## 2022-05-12 NOTE — Progress Notes (Signed)
   Subjective/Chief Complaint: Minimal flatus, no bm, no other complaints   Objective: Vital signs in last 24 hours: Temp:  [97.9 F (36.6 C)-98.9 F (37.2 C)] 97.9 F (36.6 C) (05/29 0500) Pulse Rate:  [71-77] 77 (05/29 0500) Resp:  [18-19] 19 (05/29 0500) BP: (113-129)/(67-75) 120/75 (05/29 0500) SpO2:  [95 %-96 %] 95 % (05/29 0500) Last BM Date : 05/06/22  Intake/Output from previous day: 05/28 0701 - 05/29 0700 In: 1573.3 [P.O.:1200; I.V.:316.9; IV Piggyback:51.5] Out: 945 [Urine:650; Drains:295] Intake/Output this shift: No intake/output data recorded.  GI: drain with murky fluid, mild distended incision without infection  Lab Results:  Recent Labs    05/11/22 0251 05/12/22 0116  WBC 28.6* 22.1*  HGB 10.7* 10.4*  HCT 34.5* 34.8*  PLT 337 349   BMET Recent Labs    05/11/22 0251 05/12/22 0116  NA 129* 131*  K 4.7 4.4  CL 99 98  CO2 22 26  GLUCOSE 127* 108*  BUN 15 9  CREATININE 0.73 0.71  CALCIUM 7.7* 7.8*   PT/INR Recent Labs    05/09/22 1325  LABPROT 18.6*  INR 1.6*   ABG No results for input(s): PHART, HCO3 in the last 72 hours.  Invalid input(s): PCO2, PO2  Studies/Results: No results found.  Anti-infectives: Anti-infectives (From admission, onward)    Start     Dose/Rate Route Frequency Ordered Stop   05/08/22 2200  piperacillin-tazobactam (ZOSYN) IVPB 3.375 g        3.375 g 12.5 mL/hr over 240 Minutes Intravenous Every 8 hours 05/08/22 2008         Assessment/Plan: POD#24 s/p lap assisted R colectomy for adenocarcinoma, Dr. 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 Has seen Dr. Burr Medico - CT with large post-op abscess - IR drainage 5/26 with 2.7L out initially, 3.8L documented for the last 24h, S&S pending  - also concern for splenic  artery PSA on CT, CTA this AM with concern for dissection at bifurcation of celiac as well as some extravasation from distal branch of splenic artery, he has had this embolized  -continue abx -bowel regimen started  -no bm yet, cultures with e coli on zosyn, I am concerned with wbc still up (is s/p embo distal splenic vessels)that not all of this very large collection is drained though and it was air containing- I am going to repeat ct today to make sure doesn't need more drains/change   FEN - soft diet as tolerated  VTE - Eliquis and plavix on hold   ID - Zosyn 5/25>>   - Per TRH/Cards -  CAD with history of PCI to LAD Recurrent ventricular tachycardia status post AICD placement, three episodes last PM as long as 42m Cardiology was notified, expected given underlying illness and tolerating episodes.  Ischemic cardiomyopathy CHF, systolic and diastolic, LV EF 35 to 489%Atrial fibrillation    MRolm Bookbinder5/29/2023

## 2022-05-13 ENCOUNTER — Telehealth: Payer: Self-pay | Admitting: Hematology

## 2022-05-13 ENCOUNTER — Inpatient Hospital Stay (HOSPITAL_COMMUNITY): Payer: Commercial Managed Care - HMO

## 2022-05-13 ENCOUNTER — Encounter (HOSPITAL_COMMUNITY): Payer: Self-pay | Admitting: Family Medicine

## 2022-05-13 DIAGNOSIS — I472 Ventricular tachycardia, unspecified: Secondary | ICD-10-CM | POA: Diagnosis not present

## 2022-05-13 LAB — AEROBIC/ANAEROBIC CULTURE W GRAM STAIN (SURGICAL/DEEP WOUND)

## 2022-05-13 LAB — CBC
HCT: 35.2 % — ABNORMAL LOW (ref 39.0–52.0)
Hemoglobin: 10.3 g/dL — ABNORMAL LOW (ref 13.0–17.0)
MCH: 24.2 pg — ABNORMAL LOW (ref 26.0–34.0)
MCHC: 29.3 g/dL — ABNORMAL LOW (ref 30.0–36.0)
MCV: 82.8 fL (ref 80.0–100.0)
Platelets: 334 10*3/uL (ref 150–400)
RBC: 4.25 MIL/uL (ref 4.22–5.81)
RDW: 22.8 % — ABNORMAL HIGH (ref 11.5–15.5)
WBC: 17.7 10*3/uL — ABNORMAL HIGH (ref 4.0–10.5)
nRBC: 0 % (ref 0.0–0.2)

## 2022-05-13 LAB — COMPREHENSIVE METABOLIC PANEL
ALT: 56 U/L — ABNORMAL HIGH (ref 0–44)
AST: 70 U/L — ABNORMAL HIGH (ref 15–41)
Albumin: <1.5 g/dL — ABNORMAL LOW (ref 3.5–5.0)
Alkaline Phosphatase: 174 U/L — ABNORMAL HIGH (ref 38–126)
Anion gap: 3 — ABNORMAL LOW (ref 5–15)
BUN: 7 mg/dL — ABNORMAL LOW (ref 8–23)
CO2: 27 mmol/L (ref 22–32)
Calcium: 7.6 mg/dL — ABNORMAL LOW (ref 8.9–10.3)
Chloride: 100 mmol/L (ref 98–111)
Creatinine, Ser: 0.84 mg/dL (ref 0.61–1.24)
GFR, Estimated: >60 mL/min (ref >60)
Glucose, Bld: 131 mg/dL — ABNORMAL HIGH (ref 70–99)
Potassium: 4.1 mmol/L (ref 3.5–5.1)
Sodium: 130 mmol/L — ABNORMAL LOW (ref 135–145)
Total Bilirubin: 0.1 mg/dL — ABNORMAL LOW (ref 0.3–1.2)
Total Protein: 5.5 g/dL — ABNORMAL LOW (ref 6.5–8.1)

## 2022-05-13 LAB — GLUCOSE, CAPILLARY
Glucose-Capillary: 122 mg/dL — ABNORMAL HIGH (ref 70–99)
Glucose-Capillary: 82 mg/dL (ref 70–99)
Glucose-Capillary: 138 mg/dL — ABNORMAL HIGH (ref 70–99)
Glucose-Capillary: 113 mg/dL — ABNORMAL HIGH (ref 70–99)

## 2022-05-13 LAB — MAGNESIUM: Magnesium: 2.1 mg/dL (ref 1.7–2.4)

## 2022-05-13 MED ORDER — BISACODYL 10 MG RE SUPP
10.0000 mg | Freq: Once | RECTAL | Status: AC
Start: 2022-05-13 — End: 2022-05-13
  Administered 2022-05-13: 10 mg via RECTAL
  Filled 2022-05-13: qty 1

## 2022-05-13 MED ORDER — SODIUM CHLORIDE 0.9 % IV SOLN
3.0000 g | Freq: Four times a day (QID) | INTRAVENOUS | Status: DC
  Administered 2022-05-13 – 2022-05-22 (×35): 3 g via INTRAVENOUS
  Filled 2022-05-13 (×37): qty 8

## 2022-05-13 NOTE — Progress Notes (Signed)
Physical Therapy Treatment Patient Details Name: Gregory [name] MRN: [id number] DOB: 08-20-1961 Today's Date: 05/13/2022   History of Present Illness The pt is a 61 yo male presenting 5/25 after AICD firing x3 and pt experiencing SOB. 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 PMH includes: CAD, VT, OSA on CPAP, seizures, CHF, HLD, HTN, obesity, afib, PVCs, and metastatic colon cancer s/p colon resection 5/5.    PT Comments    Pt received in supine, c/o severe R sided abdominal discomfort and agreeable to limited therapy session at bedside with encouragement. Pt able to perform bed mobility and sit<>stand to RW with increased time, mod cues for less painful technique and minA. BP checked due to pt c/o previous and current sx lightheadedness however BP, HR and SpO2 stable. Pt on 2.5L O2 [place] at time of session and remained WFL on this level. Encouraged him to attempt pivotal transfer to chair or BSC but pt reports no bowel urgency at this time, therefore pt bed placed in chair posture at end of session with instructions to maintain at least 30 min to an hour for improved pulmonary clearance and encouraged transfer to/from EOB at minimum multiple times a day to encourage bowel movement/maintain strength as he reports no BM in past 5 days. RN/supervising PT notified he would benefit from OT consult. Pt continues to benefit from PT services to progress toward functional mobility goals, discussed disposition, pt reports he is still comfortable with plan for HHPT and could stay temporarily on 1st floor if unable to make it up the 14 steps to his bedroom.   Recommendations for follow up therapy are one component of a multi-disciplinary discharge planning process, led by the attending  physician.  Recommendations may be updated based on patient status, additional functional criteria and insurance authorization.  Follow Up Recommendations  Home health PT     Assistance Recommended at Discharge Intermittent Supervision/Assistance  Patient can return home with the following A little help with walking and/or transfers;A little help with bathing/dressing/bathroom;Assistance with cooking/housework;Assist for transportation;Help with stairs or ramp for entrance   Equipment Recommendations  Rolling walker (2 wheels);BSC/3in1    Recommendations for Other Services       Precautions / Restrictions Precautions Precautions: Fall Precaution Comments: watch O2, pt on 2.5L this session, sx lightheadedness (stable BP 5/30) Restrictions Weight Bearing Restrictions: No     Mobility  Bed Mobility Overal bed mobility: Needs Assistance Bed Mobility: Supine to Sit, Sit to Supine     Supine to sit: Min assist Sit to supine: Min assist   General bed mobility comments: pt educated on log roll for abdominal precautions (denies recall of technique from PT eval on Sun) needing minA for trunk rise and max cues for sequencing log roll to sit up/lay back down for BLE assist    Transfers Overall transfer level: Needs assistance Equipment used: Rolling walker (2 wheels) Transfers: Sit to/from Stand Sit to Stand: From elevated surface, Min assist           General transfer comment: bed elevated ~3" due to pt height, cues for use of momentum but pt ignoring/not following cues, good anterior lean on count of "3" and able to stand without using momentum. Pt dependent on BUE support of RW to steady. pt reports  significant dizziness with initial stand but orthostatic stable when checked; Unable to stand long enough for 3-minute reading    Ambulation/Gait Ambulation/Gait assistance: Min assist   Assistive device: Rolling walker (2 wheels)       Pre-gait activities: sidesteps x3 to  his R side using RW, shuffled General Gait Details: pt defers gait away from bed due to severe pain and dizziness      Balance Overall balance assessment: Needs assistance Sitting-balance support: No upper extremity supported, Feet supported Sitting balance-Leahy Scale: Good     Standing balance support: Bilateral upper extremity supported, During functional activity Standing balance-Leahy Scale: Poor Standing balance comment: dependent on BUE support to maintain static or dynamic stance                            Cognition Arousal/Alertness: Awake/alert Behavior During Therapy: WFL for tasks assessed/performed Overall Cognitive Status: Within Functional Limits for tasks assessed                                 General Comments: pt pleasant and agreeable, limited due to anticipation of pain and lightheadedness with transfers (improves with seated break), pt self-limiting mobility today due to abd pain. Pt reports needing +2 prior to session but needing minA +1 at most for transfers.        Exercises General Exercises - Lower Extremity Ankle Circles/Pumps: AROM, Both, 10 reps, Supine Quad Sets: AROM, Both, 10 reps, Supine Long Arc Quad: AROM, Both, 10 reps, Seated Hip Flexion/Marching: AROM, Both, 10 reps, Seated    General Comments General comments (skin integrity, edema, etc.): BP 128/83 supine, BP 139/87 sitting, BP 148/87 standing; lightheaded, HR 72 bpm resting and up to 89 bpm with standing; SpO2 WFL on 2.5L O2 [place]      Pertinent Vitals/Pain Pain Assessment Pain Assessment: 0-10 Pain Score: 9  Pain Location: R abdomen (R>L during session) Pain Descriptors / Indicators: Grimacing, Guarding, Sharp Pain Intervention(s): Limited activity within patient's tolerance, Monitored during session, Repositioned, Patient requesting pain meds-RN notified     PT Goals (current goals can now be found in the care plan section) Acute Rehab PT Goals Patient  Stated Goal: Less pain, regain independence PT Goal Formulation: With patient Time For Goal Achievement: 05/25/22 Progress towards PT goals: Progressing toward goals    Frequency    Min 3X/week      PT Plan Current plan remains appropriate       AM-PAC PT "6 Clicks" Mobility   Outcome Measure  Help needed turning from your back to your side while in a flat bed without using bedrails?: A Little Help needed moving from lying on your back to sitting on the side of a flat bed without using bedrails?: A Little Help needed moving to and from a bed to a chair (including a wheelchair)?: A Lot Help needed standing up from a chair using your arms (e.g., wheelchair or bedside chair)?: A Lot (from non-elevated height) Help needed to walk in hospital room?: Total (unable to ambulate 20 ft at this time) Help needed climbing 3-5 steps with a railing? : Total 6 Click Score: 12    End of Session Equipment Utilized During Treatment: Gait belt;Oxygen Activity Tolerance: Patient tolerated treatment well;Patient limited by fatigue Patient left: with call bell/phone within reach;in bed;with bed alarm set;Other (comment) (heels floated, bed in chair posture with feet down (elevated  on pillow), encouraged him to sit in this posture at least 30 mins) Nurse Communication: Mobility status;Patient requests pain meds PT Visit Diagnosis: Other abnormalities of gait and mobility (R26.89);Muscle weakness (generalized) (M62.81)     Time: 0076-2263 PT Time Calculation (min) (ACUTE ONLY): 32 min  Charges:  $Therapeutic Exercise: 8-22 mins $Therapeutic Activity: 8-22 mins                     Delman Goshorn P., PTA Acute Rehabilitation Services Secure Chat Preferred 9a-5:30pm Office: [place] 05/13/2022, 11:34 AM

## 2022-05-13 NOTE — Progress Notes (Addendum)
Electrophysiology Rounding Note  Patient Name: Gregory [name] Date of Encounter: 05/13/2022  Primary Cardiologist: Sanda Klein, MD Electrophysiologist: Virl Axe, MD   Subjective   Did not feel NSVT last night. Feels like abdominal wound is more inflamed.   Inpatient Medications    Scheduled Meds:  apixaban  5 mg Oral BID   atorvastatin  80 mg Oral QPM   dapagliflozin propanediol  10 mg Oral Daily   docusate sodium  100 mg Oral BID   ezetimibe  10 mg Oral Daily   insulin aspart  0-9 Units Subcutaneous TID WC   levETIRAcetam  500 mg Oral BID   pantoprazole  40 mg Oral BID   polyethylene glycol  17 g Oral Daily   sodium chloride flush  3 mL Intravenous Q12H   sodium chloride flush  5 mL Intracatheter Q8H   Continuous Infusions:  sodium chloride     amiodarone 30 mg/hr (05/13/22 0725)   piperacillin-tazobactam (ZOSYN)  IV 3.375 g (05/13/22 0840)   PRN Meds: sodium chloride, acetaminophen **OR** acetaminophen, HYDROmorphone (DILAUDID) injection, oxyCODONE, sodium chloride flush   Vital Signs    Vitals:   05/12/22 2031 05/12/22 2246 05/13/22 0434 05/13/22 0511  BP: 109/70   118/73  Pulse: 80 76 82 73  Resp: 17   16  Temp: 98.6 F (37 C)   98.4 F (36.9 C)  TempSrc: Oral   Oral  SpO2: 96% 95% 93% 93%  Weight:      Height:        Intake/Output Summary (Last 24 hours) at 05/13/2022 0847 Last data filed at 05/13/2022 0500 Gross per 24 hour  Intake 25 ml  Output 1450 ml  Net -1425 ml   Filed Weights   05/08/22 0841 05/08/22 1626  Weight: 95.7 kg 102.1 kg    Physical Exam    GEN- The patient is well appearing, alert and oriented x 3 today.   Head- normocephalic, atraumatic Eyes-  Sclera clear, conjunctiva pink Ears- hearing intact Oropharynx- clear Neck- supple Lungs- Clear to ausculation bilaterally, normal work of breathing Heart- Regular rate and rhythm, no murmurs, rubs or gallops GI- soft, NT, ND, + BS Extremities- no clubbing or  cyanosis. No edema Skin- no rash or lesion Psych- euthymic mood, full affect Neuro- strength and sensation are intact  Labs    CBC Recent Labs    05/12/22 0116 05/13/22 0705  WBC 22.1* 17.7*  HGB 10.4* 10.3*  HCT 34.8* 35.2*  MCV 82.7 82.8  PLT 349 631   Basic Metabolic Panel Recent Labs    05/11/22 0251 05/12/22 0116 05/13/22 0705  NA 129* 131* 130*  K 4.7 4.4 4.1  CL 99 98 100  CO2 '22 26 27  '$ GLUCOSE 127* 108* 131*  BUN 15 9 7*  CREATININE 0.73 0.71 0.84  CALCIUM 7.7* 7.8* 7.6*  MG 2.1  --  2.1  PHOS 2.7  --   --    Liver Function Tests Recent Labs    05/12/22 0116 05/13/22 0705  AST 80* 70*  ALT 52* 56*  ALKPHOS 174* 174*  BILITOT 0.3 0.1*  PROT 5.4* 5.5*  ALBUMIN <1.5* <1.5*   No results for input(s): LIPASE, AMYLASE in the last 72 hours. Cardiac Enzymes No results for input(s): CKTOTAL, CKMB, CKMBINDEX, TROPONINI in the last 72 hours.   Telemetry    NSR 70-80s, short sustained run of VT overnight. Self terminated.  (personally reviewed)  Radiology    CT ABDOMEN PELVIS W CONTRAST  Result Date: 05/12/2022 CLINICAL DATA:  Follow-up percutaneously drained abdominal abscess performed on 05/09/2022. 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 EXAM: CT ABDOMEN AND PELVIS WITH CONTRAST TECHNIQUE: Multidetector CT imaging of the abdomen and pelvis was performed using the standard protocol following bolus administration of intravenous contrast. RADIATION DOSE REDUCTION: This exam was performed according to the departmental dose-optimization program which includes automated exposure control, adjustment of the mA and/or kV according to patient size and/or use of iterative reconstruction technique. CONTRAST:  183m OMNIPAQUE IOHEXOL 300 MG/ML  SOLN COMPARISON:  06/03/2022 and 05/08/2022. FINDINGS: Lower chest:  Moderate-sized left pleural effusion with a mild increase in size. Stable small right pleural effusion. Increased left lower lobe atelectasis. Stable right lower lobe atelectasis. The heart remains enlarged, especially the left atrium. Right ventricular AICD leads are again demonstrated. Atheromatous calcifications, including the coronary arteries and aorta. Hepatobiliary: Multiple small liver cysts. Sludge and 4 mm gallstone in the dependent portion of the gallbladder. No gallbladder wall thickening. Minimal pericholecystic fluid. Pancreas: No significant change in the previously demonstrated bilobed fluid collection in the region of the pancreatic tail with an oval area of persistent higher density laterally. Interval embolization coil artifacts at that location. Spleen: Anterior, superior splenic infarct. The remainder of the spleen is unremarkable. Adrenals/Urinary Tract: Stable mild bilateral adrenal hyperplasia. Bilateral renal cysts. Poorly distended bladder with mild to moderate diffuse wall thickening. Unremarkable ureters. Mild urinary tract calculi or hydronephrosis. Stomach/Bowel: Multiple mildly to moderately dilated loops of small bowel and transverse colon. Small number of sigmoid colon diverticula. Unremarkable stomach. 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 No enlarged lymph nodes. Reproductive: Prostate is unremarkable. Other: Interval right lateral percutaneous drainage catheter within the lateral aspect of the previously demonstrated 25.0 x 15.5 cm fluid and gas collection in the right mid abdomen and extending superiorly lateral to the liver. That collection is significantly smaller, measuring 14.9 x 7.7 cm at the same level on image number 60/3. In the upper pelvis, this measures 19.4 x 6.9 cm on image number 73/3, previously 28.2 x 14.8 cm. A left lateral fluid collection is  more oval in configuration with a mild increase in size. This communicates with the pelvic portion of the larger, drained collection, currently with a thin surrounding enhancing rim. This collection measures 15.9 x 5.9 cm on image number 49/3. On 05/09/2022, this measured 10.9 x 4.5 cm in corresponding dimensions. Small amount of free peritoneal fluid.  Diffuse subcutaneous edema. Musculoskeletal: Lumbar and lower thoracic spine degenerative changes. IMPRESSION: 1. Interval significant decrease in size of the large abdominal and pelvic abscess with a moderately large collection of gas and fluid remaining. 2. Small amount of ascites. 3. Cholelithiasis and mild sludge in the gallbladder without evidence of cholecystitis. 4. Moderate-sized left pleural effusion, mildly increased. 5. Small right pleural effusion, unchanged. 6. Bilateral lower lobe atelectasis, increased on the left. 7. Diffuse subcutaneous edema. Electronically Signed   By: SClaudie ReveringM.D.   On: 05/12/2022 12:24   DG Abd 2 Views  Result Date: 05/13/2022 CLINICAL DATA:  The retroperitoneal hemorrhage, left-sided abdominal pain, constipation EXAM: ABDOMEN - 2 VIEW COMPARISON:  CT 05/12/2022 FINDINGS: No free air on erect radiographs. AICD leads are partially visualized. Atelectasis/consolidation at the left lung base. Stomach appears decompressed. Multiple gas  filled small bowel loops throughout the abdomen. There is mild gaseous distention of the transverse colon, remainder of colon is nondilated. Stable pigtail catheter in the right lateral abdomen. Multiple splenic artery embolization coils noted. IMPRESSION: Nonobstructive bowel gas pattern. No free air. Electronically Signed   By: Lucrezia Europe M.D.   On: 05/13/2022 08:28   VAS [place] LOWER EXTREMITY VENOUS (DVT)  Result Date: 05/12/2022  Lower Venous DVT Study Patient Name:  BRICK KETCHER  Date of Exam:   05/12/2022 Medical Rec #: [id number]                  Accession #:    [id number]  Date of Birth: 1961-07-12                  Patient Gender: M Patient Age:   61 years Exam Location:  [facility] Procedure:      VAS [place] LOWER EXTREMITY VENOUS (DVT) Referring Phys: Shawna Clamp --------------------------------------------------------------------------------  Indications: Asymmetrical swelling- left leg.  Risk Factors: Cancer - right colon. Comparison Study: No prior lower extremity studies. Performing Technologist: Darlin Coco RDMS, RVT  Examination Guidelines: A complete evaluation includes B-mode imaging, spectral Doppler, color Doppler, and power Doppler as needed of all accessible portions of each vessel. Bilateral testing is considered an integral part of a complete examination. Limited examinations for reoccurring indications may be performed as noted. The reflux portion of the exam is performed with the patient in reverse Trendelenburg.  +-----+---------------+---------+-----------+----------+--------------+ RIGHTCompressibilityPhasicitySpontaneityPropertiesThrombus Aging +-----+---------------+---------+-----------+----------+--------------+ CFV  Full           Yes      Yes                                 +-----+---------------+---------+-----------+----------+--------------+   +---------+---------------+---------+-----------+----------+--------------+ LEFT     CompressibilityPhasicitySpontaneityPropertiesThrombus Aging +---------+---------------+---------+-----------+----------+--------------+ CFV      Full           Yes      Yes                                 +---------+---------------+---------+-----------+----------+--------------+ SFJ      Full                                                        +---------+---------------+---------+-----------+----------+--------------+ FV Prox  Full                                                        +---------+---------------+---------+-----------+----------+--------------+ FV Mid   Full                                                         +---------+---------------+---------+-----------+----------+--------------+ FV DistalFull                                                        +---------+---------------+---------+-----------+----------+--------------+  PFV      Full                                                        +---------+---------------+---------+-----------+----------+--------------+ POP      Full           Yes      Yes                                 +---------+---------------+---------+-----------+----------+--------------+ PTV      Full                                                        +---------+---------------+---------+-----------+----------+--------------+ PERO     Full                                                        +---------+---------------+---------+-----------+----------+--------------+ Gastroc  Full                                                        +---------+---------------+---------+-----------+----------+--------------+    Summary: RIGHT: - No evidence of common femoral vein obstruction.  LEFT: - There is no evidence of deep vein thrombosis in the lower extremity.  - No cystic structure found in the popliteal fossa.  *See table(s) above for measurements and observations.    Preliminary     Patient Profile     61 y.o. male admitted with VT storm and abdominal abscess  Assessment & Plan    VT storm  Mostly quiescent.  Had short run last night, asymptomatic. No therapy.  Will discuss timing of transition to po with MD Keep K > 4.0 and Mg > 2.0  2. Abdominal abscess - Note plans for additional imaging. He continues to drain purulent material with surrounding erythema.  3. CAD  No s/s ischemia  4. Constipation  Per primary   For questions or updates, please contact [place] Please consult [email or web address] for contact info under Cardiology/STEMI.  Signed, Shirley Friar, PA-C   05/13/2022, 8:47 AM    EP Attending  C/o abdominal discomfort. Still no BM. He denies palpitations. Exam is as noted above. His abdomen appears a bit more distended to me. His repeat CT scan is reviewed and concerning for residual abscess. His exam is also notable for a RRR and scattered rales in the bases. Left arm is swollen. Tele demonstrates NSR with a single run of VT.  A/P VT - continue IV amio until his abdominal process is controlled/improving. Abscess - his CT scan demonstrates smaller but persistent evidence of abscess. I suspect that he will need either more percutaneous treatment or laporascopic surgery but will defer to surgical team.  CAD - he denies anginal symptoms.  Constipation - no BM in a week.  He will not more Rx.   Carleene Overlie Franciso Dierks,MD

## 2022-05-13 NOTE — Telephone Encounter (Signed)
Scheduled follow-up appointment per 5/22 los. Patient is aware. 

## 2022-05-13 NOTE — TOC Progression Note (Signed)
Transition of Care [facility]) - Progression Note    Patient Details  Name: Gregory [name] MRN: [id number] Date of Birth: 05-12-1961  [facility] CM/SW Contact  Graves-Bigelow, Ocie Cornfield, RN Phone Number: 05/13/2022, 2:35 PM  Clinical Narrative: Case Manager spoke with the patient regarding home health services. Patient is hoping that he will not need home health. However, Case Manager did discuss home health services and patient is agreeable to have Case Manager contact   [place]. [facility] is in network with the insurance and are willing to accept the patient for services. Start of care to begin within 24-48 hours post transition home. Case Manager will continue to follow for additional transition of care needs.    Expected Discharge Plan: [place] Barriers to Discharge: Continued Medical Work up, ED Unable to Make Contact with Facility/Family  Expected Discharge Plan and Services Expected Discharge Plan: [place] In-house Referral: NA Discharge Planning Services: CM Consult Post Acute Care Choice: NA Living arrangements for the past 2 months: Single Family Home                 DME Arranged: N/A DME Agency: NA       HH Arranged: PT HH Agency: [facility] ([place]) Date HH Agency Contacted: 05/13/22 Time [place]: 1434 Representative spoke with at [place]: Caryl Pina  Readmission Risk Interventions    04/22/2022   12:49 PM  Readmission Risk Prevention Plan  Transportation Screening Complete  PCP or Specialist Appt within 3-5 Days Complete  HRI or [place] Complete  Social Work Consult for [place] Planning/Counseling Complete  Palliative Care Screening Not Applicable  Medication Review Press photographer) Complete

## 2022-05-13 NOTE — Progress Notes (Signed)
Antimicrobial stewardship note  Abscess culture growing Ecoli and strep anginosis.  Both isolates are sensitive to ampicillin / sulbactam.  Asked Dr. Dwyane Dee if we could change from zosyn to unasyn.  Orders received for unasyn - will start 3g IV q6h later today.  Heide Guile, PharmD, [place] Clinical Pharmacist Phone [phone]

## 2022-05-13 NOTE — Progress Notes (Addendum)
IR was requested for additional drain placement.    Case was reviewed by Dr. Serafina Royals, recommends reposition and exchange of the existing drain rather than placing an additional drain.    The procedure is tentatively scheduled for tomorrow pending IR schedule.  Pt to be NPO at [place].   Formal consult to follow.  Please call IR for questions and concerns.   Armando Gang Shyrl Obi PA-C 05/13/2022 12:30 PM

## 2022-05-13 NOTE — Progress Notes (Signed)
Patient ID: Gregory [name], male   DOB: 09-06-1961, 61 y.o.   MRN: [id number] [facility] Progress Note     Subjective: CC-  Continues to have some abdominal pain. The majority of his pain is on the left side where there is some erythema. Mild nausea, no emesis. Passing flatus, last BM 5 days ago. WBC down 17.7 from 22.1, VSS  Objective: Vital signs in last 24 hours: Temp:  [98.4 F (36.9 C)-98.9 F (37.2 C)] 98.4 F (36.9 C) (05/30 0511) Pulse Rate:  [73-82] 73 (05/30 0511) Resp:  [16-18] 16 (05/30 0511) BP: (109-118)/(70-73) 118/73 (05/30 0511) SpO2:  [93 %-96 %] 93 % (05/30 0511) Last BM Date : 05/06/22  Intake/Output from previous day: 05/29 0701 - 05/30 0700 In: 25  Out: 1450 [Urine:1200; Drains:250] Intake/Output this shift: No intake/output data recorded.  PE: Gen:  Alert, NAD, pleasant Pulm:  rate and effort normal on room air Abd: mild distension but soft, incision cdi, drain with purulent fluid in bag, erythema noted to left lateral aspect of abdomen  Lab Results:  Recent Labs    05/12/22 0116 05/13/22 0705  WBC 22.1* 17.7*  HGB 10.4* 10.3*  HCT 34.8* 35.2*  PLT 349 334   BMET Recent Labs    05/12/22 0116 05/13/22 0705  NA 131* 130*  K 4.4 4.1  CL 98 100  CO2 26 27  GLUCOSE 108* 131*  BUN 9 7*  CREATININE 0.71 0.84  CALCIUM 7.8* 7.6*   PT/INR No results for input(s): LABPROT, INR in the last 72 hours. CMP     Component Value Date/Time   NA 130 (L) 05/13/2022 0705   NA 137 04/11/2022 1219   K 4.1 05/13/2022 0705   CL 100 05/13/2022 0705   CO2 27 05/13/2022 0705   GLUCOSE 131 (H) 05/13/2022 0705   BUN 7 (L) 05/13/2022 0705   BUN 13 04/11/2022 1219   CREATININE 0.84 05/13/2022 0705   CREATININE 1.08 12/12/2016 1445   CALCIUM 7.6 (L) 05/13/2022 0705   PROT 5.5 (L) 05/13/2022 0705   PROT 6.5 02/28/2022 1304   ALBUMIN <1.5 (L) 05/13/2022 0705   ALBUMIN 4.0 02/28/2022 1304   AST 70 (H) 05/13/2022 0705   ALT 56 (H)  05/13/2022 0705   ALKPHOS 174 (H) 05/13/2022 0705   BILITOT 0.1 (L) 05/13/2022 0705   BILITOT 0.2 02/28/2022 1304   GFRNONAA >60 05/13/2022 0705   GFRAA 91 11/15/2020 0959   Lipase     Component Value Date/Time   LIPASE 120 (H) 09/09/2020 1810       Studies/Results: CT ABDOMEN PELVIS W CONTRAST  Result Date: 05/12/2022 CLINICAL DATA:  Follow-up percutaneously drained abdominal abscess performed on 05/09/2022. 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 EXAM: CT ABDOMEN AND PELVIS WITH CONTRAST TECHNIQUE: Multidetector CT imaging of the abdomen and pelvis was performed using the standard protocol following bolus administration of intravenous contrast. RADIATION DOSE REDUCTION: This exam was performed according to the departmental dose-optimization program which includes automated exposure control, adjustment of the mA and/or kV according to patient size and/or use of iterative reconstruction technique. CONTRAST:  158m OMNIPAQUE IOHEXOL 300 MG/ML  SOLN COMPARISON:  06/03/2022 and 05/08/2022. FINDINGS: Lower chest: Moderate-sized left pleural effusion with a mild increase in size. Stable small right pleural effusion. Increased left lower lobe atelectasis. Stable right lower lobe atelectasis. The heart  remains enlarged, especially the left atrium. Right ventricular AICD leads are again demonstrated. Atheromatous calcifications, including the coronary arteries and aorta. Hepatobiliary: Multiple small liver cysts. Sludge and 4 mm gallstone in the dependent portion of the gallbladder. No gallbladder wall thickening. Minimal pericholecystic fluid. Pancreas: No significant change in the previously demonstrated bilobed fluid collection in the region of the pancreatic tail with an oval area of persistent higher density laterally. Interval embolization coil  artifacts at that location. Spleen: Anterior, superior splenic infarct. The remainder of the spleen is unremarkable. Adrenals/Urinary Tract: Stable mild bilateral adrenal hyperplasia. Bilateral renal cysts. Poorly distended bladder with mild to moderate diffuse wall thickening. Unremarkable ureters. Mild urinary tract calculi or hydronephrosis. Stomach/Bowel: Multiple mildly to moderately dilated loops of small bowel and transverse colon. Small number of sigmoid colon diverticula. Unremarkable stomach. 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 No enlarged lymph nodes. Reproductive: Prostate is unremarkable. Other: Interval right lateral percutaneous drainage catheter within the lateral aspect of the previously demonstrated 25.0 x 15.5 cm fluid and gas collection in the right mid abdomen and extending superiorly lateral to the liver. That collection is significantly smaller, measuring 14.9 x 7.7 cm at the same level on image number 60/3. In the upper pelvis, this measures 19.4 x 6.9 cm on image number 73/3, previously 28.2 x 14.8 cm. A left lateral fluid collection is more oval in configuration with a mild increase in size. This communicates with the pelvic portion of the larger, drained collection, currently with a thin surrounding enhancing rim. This collection measures 15.9 x 5.9 cm on image number 49/3. On 05/09/2022, this measured 10.9 x 4.5 cm in corresponding dimensions. Small amount of free peritoneal fluid.  Diffuse subcutaneous edema. Musculoskeletal: Lumbar and lower thoracic spine degenerative changes. IMPRESSION: 1. Interval significant decrease in size of the large abdominal and pelvic abscess with a moderately large collection of gas and fluid remaining. 2. Small amount of ascites. 3. Cholelithiasis and mild sludge in the gallbladder without evidence of cholecystitis. 4. Moderate-sized  left pleural effusion, mildly increased. 5. Small right pleural effusion, unchanged. 6. Bilateral lower lobe atelectasis, increased on the left. 7. Diffuse subcutaneous edema. Electronically Signed   By: Claudie Revering M.D.   On: 05/12/2022 12:24   DG Abd 2 Views  Result Date: 05/13/2022 CLINICAL DATA:  The retroperitoneal hemorrhage, left-sided abdominal pain, constipation EXAM: ABDOMEN - 2 VIEW COMPARISON:  CT 05/12/2022 FINDINGS: No free air on erect radiographs. AICD leads are partially visualized. Atelectasis/consolidation at the left lung base. Stomach appears decompressed. Multiple gas filled small bowel loops throughout the abdomen. There is mild gaseous distention of the transverse colon, remainder of colon is nondilated. Stable pigtail catheter in the right lateral abdomen. Multiple splenic artery embolization coils noted. IMPRESSION: Nonobstructive bowel gas pattern. No free air. Electronically Signed   By: Lucrezia Europe M.D.   On: 05/13/2022 08:28   VAS [place] LOWER EXTREMITY VENOUS (DVT)  Result Date: 05/12/2022  Lower Venous DVT Study Patient Name:  Gregory [name]  Date of Exam:   05/12/2022 Medical Rec #: [id number]                  Accession #:    [id number] Date of Birth: [birth year]                  Patient Gender: M Patient Age:   71 years Exam Location:  [facility]  Procedure:      VAS [place] LOWER EXTREMITY VENOUS (DVT) Referring Phys: Shawna Clamp --------------------------------------------------------------------------------  Indications: Asymmetrical swelling- left leg.  Risk Factors: Cancer - right colon. Comparison Study: No prior lower extremity studies. Performing Technologist: Darlin Coco RDMS, RVT  Examination Guidelines: A complete evaluation includes B-mode imaging, spectral Doppler, color Doppler, and power Doppler as needed of all accessible portions of each vessel. Bilateral testing is considered an integral part of a complete examination. Limited examinations for  reoccurring indications may be performed as noted. The reflux portion of the exam is performed with the patient in reverse Trendelenburg.  +-----+---------------+---------+-----------+----------+--------------+ RIGHTCompressibilityPhasicitySpontaneityPropertiesThrombus Aging +-----+---------------+---------+-----------+----------+--------------+ CFV  Full           Yes      Yes                                 +-----+---------------+---------+-----------+----------+--------------+   +---------+---------------+---------+-----------+----------+--------------+ LEFT     CompressibilityPhasicitySpontaneityPropertiesThrombus Aging +---------+---------------+---------+-----------+----------+--------------+ CFV      Full           Yes      Yes                                 +---------+---------------+---------+-----------+----------+--------------+ SFJ      Full                                                        +---------+---------------+---------+-----------+----------+--------------+ FV Prox  Full                                                        +---------+---------------+---------+-----------+----------+--------------+ FV Mid   Full                                                        +---------+---------------+---------+-----------+----------+--------------+ FV DistalFull                                                        +---------+---------------+---------+-----------+----------+--------------+ PFV      Full                                                        +---------+---------------+---------+-----------+----------+--------------+ POP      Full           Yes      Yes                                 +---------+---------------+---------+-----------+----------+--------------+ PTV      Full                                                        +---------+---------------+---------+-----------+----------+--------------+  PERO     Full                                                        +---------+---------------+---------+-----------+----------+--------------+ Gastroc  Full                                                        +---------+---------------+---------+-----------+----------+--------------+    Summary: RIGHT: - No evidence of common femoral vein obstruction.  LEFT: - There is no evidence of deep vein thrombosis in the lower extremity.  - No cystic structure found in the popliteal fossa.  *See table(s) above for measurements and observations.    Preliminary     Anti-infectives: Anti-infectives (From admission, onward)    Start     Dose/Rate Route Frequency Ordered Stop   05/08/22 2200  piperacillin-tazobactam (ZOSYN) IVPB 3.375 g        3.375 g 12.5 mL/hr over 240 Minutes Intravenous Every 8 hours 05/08/22 2008          Assessment/Plan POD#25 s/p lap assisted R colectomy for adenocarcinoma, Dr. 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 Has seen Dr. Burr Medico - CT 5/25 with large post-op abscess - IR drainage 5/26 with 2.7L out initially, 250cc documented for the last 24h, Culture growing E COLI and STREP ANGINOSIS >> discussed with primary team who is going to review with pharmacy - also concern for splenic artery PSA on CT, CTA this AM with concern for dissection at bifurcation of celiac as well as some extravasation from distal branch of splenic artery, he has had this embolized 5/26. 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  Ischemic cardiomyopathy CHF, systolic and diastolic, LV EF 35 to 432%Atrial fibrillation     LOS: 5 days    BWellington [place] [facility] 05/13/2022, 9:01 AM Please see Amion for pager number during day hours 7:00am-4:30pm

## 2022-05-13 NOTE — Progress Notes (Signed)
Patient refused CPAP for tonight. Unit still at bedside if needed and told pt to call if he changed his mind.

## 2022-05-13 NOTE — Consult Note (Signed)
Chief Complaint: Patient was seen in consultation today for persistent intra-abdominal fluid collection.   Referring Physician(s): Dr. Romana Juniper  Supervising Physician: Mir, Sharen Heck  Patient Status: [facility] - In-pt  History of Present Illness: Gregory [name] is a 61 y.o. male with past medical history of AICD, CAD, HTN, CAD with prior stent placements on Plavix '75mg'$  daily at home recently admitted 4/28-5/12/23 for partially obstructing colon mass.  He underwent biopsy with hand-assisted laparoscopic right colectomy 5/5 as well as cardiac catheterization. He presented to the ED yesterday morning after his ICD fired at home.  He also complained of abdominal pain with distention. EP was consulted and started him on amiodarone, Eliquis and Plavix currently on hold.    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 Dwaine Gale on 05/09/22.   Patient underwent f/u CT AP with on 05/12/22 which showed:  1. Interval significant decrease in size of the large abdominal and pelvic abscess with a moderately large collection of gas and fluid remaining. 2. Small amount of ascites. 3. Cholelithiasis and mild sludge in the gallbladder without evidence of cholecystitis. 4. Moderate-sized left pleural effusion, mildly increased. 5. Small right pleural effusion, unchanged. 6. Bilateral lower lobe atelectasis, increased on the left. 7. Diffuse subcutaneous edema.  IR consulted for possible additional drain placement by general surgery.   Patient laying in bed, not in acute distress. RN at bedside.  Reports abdominal pain, especially when he tries to move.  Denise headache, fever, chills, shortness of breath, cough,  chest pain, nausea ,vomiting, and bleeding.  Past Medical History:  Diagnosis Date   AICD (automatic cardioverter/defibrillator) present 2005   CAD (coronary artery disease) 12/01/2013   Chronic combined systolic and diastolic CHF, NYHA class 1 ([place]) 12/01/2013   Erectile dysfunction 12/01/2013   HTN (hypertension) 12/01/2013   Hyperlipidemia 12/01/2013   Ischemic cardiomyopathy 12/01/2013   Presence of permanent cardiac pacemaker    Sleep apnea     Past Surgical History:  Procedure Laterality Date   ACHILLES TENDON REPAIR     lft foot   BIOPSY  04/16/2022   Procedure: BIOPSY;  Surgeon: Yetta Flock, MD;  Location: [place];  Service: Gastroenterology;;   CARDIAC CATHETERIZATION N/A 05/05/2016   Procedure: Left Heart Cath and Coronary Angiography;  Surgeon: Leonie Man, MD;  Location: [place] [facility];  Service: Cardiovascular;  Laterality: N/A;   CARDIAC DEFIBRILLATOR PLACEMENT     CARDIOVERSION N/A 09/13/2020   Procedure: CARDIOVERSION;  Surgeon: Werner Lean, MD;  Location: [place] [facility];  Service: Cardiovascular;  Laterality: N/A;   CARDIOVERSION N/A 09/19/2020   Procedure: CARDIOVERSION;  Surgeon: [name], MD;  Location: [facility] ENDOSCOPY;  Service: Cardiovascular;  Laterality: N/A;   COLON RESECTION N/A 04/18/2022   Procedure: HAND ASSISTED LAPAROSCOPIC RIGHT COLON RESECTION;  Surgeon: Felicie Morn, MD;  Location: [place];  Service: General;  Laterality: N/A;   COLONOSCOPY WITH PROPOFOL N/A 04/16/2022   Procedure: COLONOSCOPY WITH PROPOFOL;  Surgeon: Yetta Flock, MD;  Location: [place];  Service: Gastroenterology;  Laterality: N/A;   EP IMPLANTABLE DEVICE N/A 12/19/2016   Procedure: ICD Generator Changeout;  Surgeon: [name], MD;  Location: [place] [facility];  Service: Cardiovascular;  Laterality: N/A;   ESOPHAGOGASTRODUODENOSCOPY N/A 04/16/2022  Procedure: ESOPHAGOGASTRODUODENOSCOPY (EGD);  Surgeon: Yetta Flock, MD;   Location: [facility] ENDOSCOPY;  Service: Gastroenterology;  Laterality: N/A;   IR ANGIOGRAM SELECTIVE EACH ADDITIONAL VESSEL  05/09/2022   IR ANGIOGRAM VISCERAL SELECTIVE  05/09/2022   IR EMBO ART  VEN HEMORR LYMPH EXTRAV  INC GUIDE ROADMAPPING  05/09/2022   IR IMAGE GUIDED DRAINAGE PERCUT CATH  PERITONEAL RETROPERIT  05/09/2022   IR US GUIDE VASC ACCESS RIGHT  05/09/2022   LEFT HEART CATH AND CORONARY ANGIOGRAPHY N/A 04/17/2022   Procedure: LEFT HEART CATH AND CORONARY ANGIOGRAPHY;  Surgeon: Belva Crome, MD;  Location: [place] [facility];  Service: Cardiovascular;  Laterality: N/A;   POLYPECTOMY  04/16/2022   Procedure: POLYPECTOMY;  Surgeon: Yetta Flock, MD;  Location: [facility] ENDOSCOPY;  Service: Gastroenterology;;   RIGHT/LEFT HEART CATH AND CORONARY ANGIOGRAPHY N/A 09/24/2020   Procedure: RIGHT/LEFT HEART CATH AND CORONARY ANGIOGRAPHY;  Surgeon: Lorretta Harp, MD;  Location: [place] [facility];  Service: Cardiovascular;  Laterality: N/A;   SUBMUCOSAL TATTOO INJECTION  04/16/2022   Procedure: SUBMUCOSAL TATTOO INJECTION;  Surgeon: Yetta Flock, MD;  Location: [facility] ENDOSCOPY;  Service: Gastroenterology;;   WRIST TENODESIS      Allergies: Patient has no known allergies.  Medications: Prior to Admission medications   Medication Sig Start Date End Date Taking? Authorizing Provider  acetaminophen (TYLENOL) 500 MG tablet Take 1,000 mg by mouth every 6 (six) hours as needed for moderate pain.   Yes [provider]  amiodarone (PACERONE) 200 MG tablet Take 2 tablets (400 mg total) by mouth daily. 04/24/22  Yes Patrecia Pour, MD  apixaban (ELIQUIS) 5 MG TABS tablet TAKE 1 TABLET(5 MG) BY MOUTH TWICE DAILY Patient taking differently: Take 5 mg by mouth 2 (two) times daily. 04/11/22  Yes Croitoru, [name], MD  atorvastatin (LIPITOR) 80 MG tablet TAKE ONE TABLET BY MOUTH EVERY EVENING Patient taking differently: Take 80 mg by mouth every evening. 05/05/22  Yes Croitoru, [name], MD  clopidogrel  (PLAVIX) 75 MG tablet TAKE 1 TABLET(75 MG) BY MOUTH DAILY Patient taking differently: Take 75 mg by mouth daily. TAKE 1 TABLET(75 MG) BY MOUTH DAILY 10/02/21  Yes Croitoru, [name], MD  dapagliflozin propanediol (FARXIGA) 10 MG TABS tablet Take 1 tablet (10 mg total) by mouth daily. 05/08/22  Yes Croitoru, [name], MD  ezetimibe (ZETIA) 10 MG tablet Take 1 tablet (10 mg total) by mouth daily. 03/13/22  Yes Croitoru, [name], MD  furosemide (LASIX) 20 MG tablet Take 2 tablets (40 mg total) by mouth 3 (three) times a week. Patient taking differently: Take 40 mg by mouth every Monday, Wednesday, and Friday. 03/12/22  Yes [name], MD  levETIRAcetam (KEPPRA) 500 MG tablet TAKE 1 TABLET(500 MG) BY MOUTH TWICE DAILY Patient taking differently: Take 500 mg by mouth 2 (two) times daily. 04/28/22  Yes [name], MD  metoprolol succinate (TOPROL-XL) 25 MG 24 hr tablet Take one tablet, 25 mg, in the morning and 50 mg, two tablets, in the evening. Patient taking differently: 25 mg. Per patient taking 25 mg in the morning and 25 mg at night 06/27/21  Yes Croitoru, [name], MD  oxyCODONE (OXY IR/ROXICODONE) 5 MG immediate release tablet Take 1-2 tablets (5-10 mg total) by mouth every 8 (eight) hours as needed for breakthrough pain. 05/05/22  Yes [name], MD  potassium chloride (KLOR-CON M) 10 MEQ tablet Take 1 tablet (10 mEq total) by mouth daily. 12/02/21  Yes Croitoru, Dani Gobble, MD  sacubitril-valsartan Delene Loll)  49-51 MG Take 1 tablet by mouth 2 (two) times daily. 10/07/21  Yes Croitoru, [name], MD  pantoprazole (PROTONIX) 40 MG tablet TAKE 1 TABLET(40 MG) BY MOUTH DAILY Patient taking differently: Take 40 mg by mouth daily. TAKE 1 TABLET(40 MG) BY MOUTH DAILY 03/13/22   Croitoru, [name], MD  sildenafil (VIAGRA) 50 MG tablet Take 1 tablet (50 mg total) by mouth as needed for erectile dysfunction. 01/25/21   Shirley Friar, PA-C     Family History  Problem Relation Age of Onset   Heart disease Mother     Brain cancer Father    Hypertension Daughter     Social History   Socioeconomic History   Marital status: Single    Spouse name: Not on file   Number of children: Not on file   Years of education: Not on file   Highest education level: Not on file  Occupational History   Not on file  Tobacco Use   Smoking status: Former    Packs/day: 1.00    Years: 30.00    Pack years: 30.00    Types: Cigarettes    Quit date: 05/31/2013    Years since quitting: 8.9   Smokeless tobacco: Never  Vaping Use   Vaping Use: Never used  Substance and Sexual Activity   Alcohol use: Not Currently    Comment: 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    Right handed   Social Determinants of Health   Financial Resource Strain: High Risk   Difficulty of Paying Living Expenses: Hard  Food Insecurity: Not on file  Transportation Needs: Not on file  Physical Activity: Not on file  Stress: Not on file  Social Connections: Not on file     Review of Systems: A 12 point ROS discussed and pertinent positives are indicated in the HPI above.  All other systems are negative.  Review of Systems  Constitutional:  Negative for fatigue and fever.  Respiratory:  Negative for cough and shortness of breath.   Cardiovascular:  Negative for chest pain.  Gastrointestinal:  Negative for abdominal pain.  Musculoskeletal:  Negative for back pain.  Psychiatric/Behavioral:  Negative for behavioral problems and confusion.    Vital Signs: BP (!) 142/86 (BP Location: Left Arm)   Pulse 73   Temp 97.8 F (36.6 C) (Oral)   Resp 17   Ht '6\' 6"'$  (1.981 m)   Wt 225 lb 1.4 oz (102.1 kg)   SpO2 99%   BMI 26.01 kg/m   Physical Exam Vitals  and nursing note reviewed.  Constitutional:      General: He is not in acute distress.    Appearance: He is well-developed. He is not ill-appearing.  HENT:     Head: Normocephalic.  Cardiovascular:     Rate and Rhythm: Normal rate and regular rhythm.     Heart sounds: Normal heart sounds.  Pulmonary:     Effort: Pulmonary effort is normal.     Breath sounds: Normal breath sounds.  Abdominal:     General: Bowel sounds are normal.     Palpations: Abdomen is soft.  Skin:    General: Skin is  warm and dry.     Comments: Positive RLQ drain to a gravity bag. Site is unremarkable with no erythema, edema, tenderness, bleeding or drainage. Suture and stat lock in place. Dressing is clean, dry, and intact. 20 ml of  tan colored fluid noted in the bag.    Neurological:     General: No focal deficit present.     Mental Status: He is alert and oriented to person, place, and time.  Psychiatric:        Mood and Affect: Mood normal.        Behavior: Behavior normal.     MD Evaluation Airway: WNL Heart: WNL Abdomen: WNL Chest/ Lungs: WNL ASA  Classification: 3 Mallampati/Airway Score: Two   Imaging: DG Abd 1 View  Result Date: 04/21/2022 CLINICAL DATA:  Ileus EXAM: ABDOMEN - 1 VIEW COMPARISON:  04/16/2022 FINDINGS: Gaseous distension of the small bowel and colon likely reflecting an ileus. Small amount of contrast in the splenic flexure. No bowel dilatation to suggest obstruction. No evidence of pneumoperitoneum, portal venous gas or pneumatosis. No pathologic calcifications along the expected course of the ureters. No acute osseous abnormality. IMPRESSION: 1. Gaseous distension of the small bowel and colon likely reflecting an ileus. Electronically Signed   By: Kathreen Devoid M.D.   On: 04/21/2022 10:39   CT ANGIO ABDOMEN W &/OR WO CONTRAST  Result Date: 05/09/2022 CLINICAL DATA:  Splenic artery dissection. EXAM: CT ANGIOGRAPHY ABDOMEN TECHNIQUE: Multidetector CT imaging of the abdomen was  performed using the standard protocol during bolus administration of intravenous contrast. Multiplanar reconstructed images and MIPs were obtained and reviewed to evaluate the vascular anatomy. RADIATION DOSE REDUCTION: This exam was performed according to the departmental dose-optimization program which includes automated exposure control, adjustment of the mA and/or kV according to patient size and/or use of iterative reconstruction technique. CONTRAST:  85m OMNIPAQUE IOHEXOL 350 MG/ML SOLN COMPARISON:  CT of May 08, 2022. FINDINGS: VASCULAR Aorta: Atherosclerosis of abdominal aorta is noted without dissection. 3 cm fusiform infrarenal abdominal aortic aneurysm is noted. 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 No thrombus is noted. 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 The common hepatic, left gastric and splenic arteries appear to be patent. There does appear to be some extravasation of contrast arising from a distal branch of the splenic artery in the region of the splenic hilum, best seen on image number 33 of series 5. 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 SMA: Patent without evidence of aneurysm, dissection, vasculitis or significant stenosis. Renals: Both renal arteries are patent without evidence of aneurysm, dissection, vasculitis, fibromuscular dysplasia or significant stenosis. IMA: Patent without evidence of aneurysm, dissection, vasculitis or significant stenosis. Inflow: Patent without evidence of aneurysm, dissection, vasculitis or significant stenosis. Veins: No obvious venous abnormality within the limitations of this arterial phase study. Review of the MIP images confirms the above findings. NON-VASCULAR Lower chest: Bilateral pleural effusions are noted with adjacent subsegmental atelectasis. Hepatobiliary: Gallstone is noted. Dilated gallbladder  is noted. No biliary dilatation is noted. Liver is otherwise unremarkable. Pancreas: There is again noted peripancreatic fluid collections including 10.6 x 4.4 cm bilobed abnormality which appears to contain a pseudoaneurysm as described above. Spleen: Normal in size. No definite splenic parenchymal abnormality is noted. Adrenals/Urinary Tract: Bilateral adrenal nodules are again noted. No hydronephrosis or renal obstruction is noted. Stomach/Bowel: Stomach  is unremarkable. No definite evidence of bowel obstruction is noted. Lymphatic: No definite adenopathy is noted. Other: There is again noted a large complex fluid collection involving most of the right-sided the abdomen that measures 25 x 15 cm. Multiple air-fluid levels are noted within it suggesting internal loculations. Musculoskeletal: No acute or significant osseous findings. 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 There is also noted small amount of contrast extravasation involving a distal branch of the splenic artery which is seen supplying pseudoaneurysm in the splenic hilum, suggesting arterial injury. These results will be called to the ordering clinician or representative by the Radiologist Assistant, and communication documented in the PACS or zVision Dashboard. 3 cm infrarenal abdominal aortic aneurysm is noted. Recommend follow-up ultrasound every 3 years. This recommendation follows ACR consensus guidelines: White Paper of the ACR Incidental Findings Committee II on Vascular Findings. J Am Coll Radiol 2013; 10:789-794. NON-VASCULAR Continued presence of large complex fluid collection in the right-sided the abdomen measuring 25 x 15 cm. This is concerning for abscess. Multiple air-fluid levels are noted within it suggesting internal loculations. Continued presence of peripancreatic fluid collections including 10.6 x 4.4 cm bilobed abnormality around the  pancreatic tail which contains pseudoaneurysm described above. Bilateral adrenal nodules are again noted. Bilateral pleural effusions are again noted with associated atelectasis. Aortic Atherosclerosis (ICD10-I70.0). Electronically Signed   By: Marijo Conception M.D.   On: 05/09/2022 09:52   CT ABDOMEN PELVIS W CONTRAST  Result Date: 05/12/2022 CLINICAL DATA:  Follow-up percutaneously drained abdominal abscess performed on 05/09/2022. 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 EXAM: CT ABDOMEN AND PELVIS WITH CONTRAST TECHNIQUE: Multidetector CT imaging of the abdomen and pelvis was performed using the standard protocol following bolus administration of intravenous contrast. RADIATION DOSE REDUCTION: This exam was performed according to the departmental dose-optimization program which includes automated exposure control, adjustment of the mA and/or kV according to patient size and/or use of iterative reconstruction technique. CONTRAST:  166m OMNIPAQUE IOHEXOL 300 MG/ML  SOLN COMPARISON:  06/03/2022 and 05/08/2022. FINDINGS: Lower chest: Moderate-sized left pleural effusion with a mild increase in size. Stable small right pleural effusion. Increased left lower lobe atelectasis. Stable right lower lobe atelectasis. The heart remains enlarged, especially the left atrium. Right ventricular AICD leads are again demonstrated. Atheromatous calcifications, including the coronary arteries and aorta. Hepatobiliary: Multiple small liver cysts. Sludge and 4 mm gallstone in the dependent portion of the gallbladder. No gallbladder wall thickening. Minimal pericholecystic fluid. Pancreas: No significant change in the previously demonstrated bilobed fluid collection in the region of the pancreatic tail with an oval area of persistent higher density laterally. Interval  embolization coil artifacts at that location. Spleen: Anterior, superior splenic infarct. The remainder of the spleen is unremarkable. Adrenals/Urinary Tract: Stable mild bilateral adrenal hyperplasia. Bilateral renal cysts. Poorly distended bladder with mild to moderate diffuse wall thickening. Unremarkable ureters. Mild urinary tract calculi or hydronephrosis. Stomach/Bowel: Multiple mildly to moderately dilated loops of small bowel and transverse colon. Small number of sigmoid colon diverticula. Unremarkable stomach. 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 No enlarged lymph nodes. Reproductive: Prostate is unremarkable. Other: Interval right lateral percutaneous drainage catheter within the lateral aspect of the previously demonstrated  25.0 x 15.5 cm fluid and gas collection in the right mid abdomen and extending superiorly lateral to the liver. That collection is significantly smaller, measuring 14.9 x 7.7 cm at the same level on image number 60/3. In the upper pelvis, this measures 19.4 x 6.9 cm on image number 73/3, previously 28.2 x 14.8 cm. A left lateral fluid collection is more oval in configuration with a mild increase in size. This communicates with the pelvic portion of the larger, drained collection, currently with a thin surrounding enhancing rim. This collection measures 15.9 x 5.9 cm on image number 49/3. On 05/09/2022, this measured 10.9 x 4.5 cm in corresponding dimensions. Small amount of free peritoneal fluid.  Diffuse subcutaneous edema. Musculoskeletal: Lumbar and lower thoracic spine degenerative changes. IMPRESSION: 1. Interval significant decrease in size of the large abdominal and pelvic abscess with a moderately large collection of gas and fluid remaining. 2. Small amount of ascites. 3. Cholelithiasis and mild sludge in the gallbladder without evidence of cholecystitis.  4. Moderate-sized left pleural effusion, mildly increased. 5. Small right pleural effusion, unchanged. 6. Bilateral lower lobe atelectasis, increased on the left. 7. Diffuse subcutaneous edema. Electronically Signed   By: Claudie Revering M.D.   On: 05/12/2022 12:24   CT ABDOMEN PELVIS W CONTRAST  Addendum Date: 05/08/2022   ADDENDUM REPORT: 05/08/2022 19:34 ADDENDUM: These results were called by telephone at the time of interpretation on 05/08/2022 at 7:15 pm to provider Dr Georganna Skeans, who verbally acknowledged these results. Electronically Signed   By: Julian Hy M.D.   On: 05/08/2022 19:34   Result Date: 05/08/2022 CLINICAL DATA:  Abdominal pain, ileus, status post surgery EXAM: CT ABDOMEN AND PELVIS WITH CONTRAST TECHNIQUE: Multidetector CT imaging of the abdomen and pelvis was performed using the standard protocol following bolus administration of intravenous contrast. RADIATION DOSE REDUCTION: This exam was performed according to the departmental dose-optimization program which includes automated exposure control, adjustment of the mA and/or kV according to patient size and/or use of iterative reconstruction technique. CONTRAST:  139m OMNIPAQUE IOHEXOL 300 MG/ML  SOLN COMPARISON:  Abdominal radiograph dated 05/08/2022. CT abdomen/pelvis dated 04/16/2022. FINDINGS: Lower chest: Moderate left and small right pleural effusions. Bilateral lower lobe atelectasis. Hepatobiliary: Subcentimeter hepatic cysts. Gallbladder is notable for a 6 mm layering gallstone (series 3/image 40). No associated inflammatory changes. No intrahepatic or extrahepatic duct dilatation. Pancreas: Pancreas is notable for peripancreatic fluid collections, including a dominant 4.9 cm collection along the pancreatic tail (series 3/image 28), previously 4.4 cm. Within this collection, there has been development of a 2.7 cm pseudoaneurysm (series 3/image 25), which is new from the prior. Spleen: Within normal limits.  Adrenals/Urinary Tract: Multiple bilateral adrenal nodules, measuring up to 2.0 cm on the left (series 8/image 15), likely reflecting benign adrenal adenomas, unchanged. Multiple bilateral renal cysts, measuring up to 2.7 cm in the medial left upper pole (series 8/image 18), benign. No hydronephrosis. Bladder is within normal limits. Stomach/Bowel: Stomach is notable for a tiny hiatal hernia. Status post right hemicolectomy with appendectomy. No evidence of bowel obstruction. Vascular/Lymphatic: Infrarenal abdominal aorta measures up to 3.0 cm (series 3/image 56). Atherosclerotic calcifications of the abdominal aorta and branch vessels. No suspicious abdominopelvic lymphadenopathy. Reproductive: Prostate is unremarkable. Other: Large fluid in gas collection with enhancing rim in the abdomen/pelvis, measuring at least 14.8 x 28.2 x 20.6 cm, reflecting a postoperative abscess. Additional mild abdominopelvic ascites. No free air. Musculoskeletal: Mild degenerative changes of the visualized thoracolumbar spine. IMPRESSION: Large postoperative abscess in  the abdomen/pelvis, measuring at least 14.8 x 28.2 x 20.6 cm. Consider surgical washout versus drainage by interventional radiology, as clinically warranted. 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 Interventional radiology consultation is suggested for embolization. Additional ancillary findings as above. Aortic Atherosclerosis (ICD10-I70.0). Electronically Signed: By: Julian Hy M.D. 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 Ultrasound-guided right abdominal abscess drain placement 2. Ultrasound-guided access of right common femoral artery 3. Celiac angiogram 4. Splenic artery angiogram and distal splenic branch embolization (x2) 5. Angio-Seal closure of right common femoral artery access MEDICATIONS: Patient currently on inpatient regiment of IV Zosyn. ANESTHESIA/SEDATION: Moderate (conscious) sedation was employed during this procedure. A total of Versed 1 mg and Fentanyl 50 mcg was administered intravenously by the radiology nurse. Total intra-service moderate Sedation Time: 160 minutes. The patient's level of consciousness and vital signs were monitored continuously by radiology nursing throughout the procedure under my direct supervision. CONTRAST:  130 mL intra arterial Omnipaque 300 FLUOROSCOPY: Radiation Exposure Index (as provided by the fluoroscopic device): 992 mGy Kerma COMPLICATIONS: None immediate. PROCEDURE: Informed consent was obtained from the patient following explanation of the procedure, risks, benefits and alternatives. The patient understands, agrees and consents for the procedure. All questions were addressed. A time out was performed prior to the initiation of the procedure. Maximal barrier sterile technique utilized including caps, mask, sterile gowns, sterile gloves, large sterile drape, hand hygiene, and chlorhexidine prep. Right abdominal wall skin prepped and draped in usual fashion. Ultrasound evaluation showed large loculated abscess. Following local lidocaine administration, 19 gauge Yueh needle was advanced into the right abdominal collection using continuous ultrasound guidance. Yueh catheter removed over 0.035 inch guidewire, serial dilation performed, and 14.0 [place] multipurpose pigtail drain placed. 20 mL purulent sample aspirated and sent for Gram stain and culture. Total of 2700 mL of purulent fluid drained from the collection. Drain secured to skin with suture and connected to  bag. Right groin skin prepped and draped usual fashion. Ultrasound image documenting patency of the right common femoral artery was obtained and placed in permanent medical record. Sterile ultrasound probe cover and gel utilized throughout the procedure. Utilizing continuous ultrasound guidance, the right common femoral artery was accessed at the level of the femoral head with a 21 gauge needle. 21 gauge needle exchanged for a transitional dilator set over 0.018 inch guidewire. Transitional dilator set exchanged for 5 French sheath over 0.035 inch guidewire. Sos omni catheter utilized to select the celiac artery. 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 Common hepatic, left gastric, and splenic arteries are patent. 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 Lantern microcatheter was advanced into the splenic artery, however the base catheter was pushed out while trying to advance the microcatheter. Sos omni catheter was exchanged for a C2 catheter. C2 catheter advanced into the splenic artery utilizing Glidewire. Splenic angiogram again showed filling of the pseudoaneurysm. Lantern microcatheter advanced further into the splenic artery and repeat angiogram was performed. There was intermittent filling of the pseudoaneurysm it was very difficult to determine which branch of the splenic artery supplied the pseudoaneurysm.  Lantern microcatheter advanced into 1 of the splenic artery branches nearest the pseudoaneurysm and angiogram was performed. Although no direct filling of the pseudoaneurysm was seen, irregular vessels were identified in this area. This artery was embolized with combination of 4 mm Ruby coils. The next closest branch of the splenic artery was selected and angiogram was performed which showed flow into the pseudoaneurysm. The microcatheter was advanced beyond the origin of the pseudoaneurysm and the artery was  embolized with 4 mm Ruby coils. The microcatheter was then retracted and embolization was completed with 4 mm Ruby coils. Microcatheter was removed. Splenic angiogram showed no additional flow into the pseudoaneurysm. Sheath angiogram demonstrated appropriate puncture at the level of the common femoral head. The groin was re-prepped and draped and closure was performed with 6 [place] Angio-Seal device. IMPRESSION: 1. 14.0 French drain placed in right abdominal abscess, removing 2700 mL of purulent fluid. 2. Splenic angiogram shows active extravasation from distal splenic branch, 2 of which were coil embolized. The branch responsible for directly supplying the pseudoaneurysm was successfully embolized distal and proximal to origin of the pseudoaneurysm. Electronically Signed   By: Miachel Roux M.D.   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 Ultrasound-guided right abdominal abscess drain placement 2. Ultrasound-guided access of right common femoral artery 3. Celiac angiogram 4. Splenic artery angiogram and distal splenic branch embolization (x2) 5. Angio-Seal closure of right common femoral artery access MEDICATIONS: Patient currently on inpatient regiment of IV Zosyn. ANESTHESIA/SEDATION: Moderate (conscious) sedation was employed during this procedure. A total of Versed 1 mg and Fentanyl 50 mcg was administered intravenously by the radiology nurse. Total intra-service moderate Sedation Time: 160 minutes. The patient's level of consciousness and vital signs were monitored continuously by radiology nursing throughout the procedure under my  direct supervision. CONTRAST:  130 mL intra arterial Omnipaque 300 FLUOROSCOPY: Radiation Exposure Index (as provided by the fluoroscopic device): 366 mGy Kerma COMPLICATIONS: None immediate. PROCEDURE: Informed consent was obtained from the patient following explanation of the procedure, risks, benefits and alternatives. The patient understands, agrees and consents for the procedure. All questions were addressed. A time out was performed prior to the initiation of the procedure. Maximal barrier sterile technique utilized including caps, mask, sterile gowns, sterile gloves, large sterile drape, hand hygiene, and chlorhexidine prep. Right abdominal wall skin prepped and draped in usual fashion. Ultrasound evaluation showed large loculated abscess. Following local lidocaine administration, 19 gauge Yueh needle was advanced into the right abdominal collection using continuous ultrasound guidance. Yueh catheter removed over 0.035 inch guidewire, serial dilation performed, and 14.0 [place] multipurpose pigtail drain placed. 20 mL purulent sample aspirated and sent for Gram stain and culture. Total of 2700 mL of purulent fluid drained from the collection. Drain secured to skin with suture and connected to bag. Right groin skin prepped and draped usual fashion. Ultrasound image documenting patency of the right common femoral artery was obtained and placed in permanent medical record. Sterile ultrasound probe cover and gel utilized throughout the procedure. Utilizing continuous ultrasound guidance, the right common femoral artery was accessed at the level of the femoral head with a 21 gauge needle. 21 gauge needle exchanged for a transitional dilator set  over 0.018 inch guidewire. Transitional dilator set exchanged for 5 French sheath over 0.035 inch guidewire. Sos omni catheter utilized to select the celiac artery. 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 Common  hepatic, left gastric, and splenic arteries are patent. 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 Lantern microcatheter was advanced into the splenic artery, however the base catheter was pushed out while trying to advance the microcatheter. Sos omni catheter was exchanged for a C2 catheter. C2 catheter advanced into the splenic artery utilizing Glidewire. Splenic angiogram again showed filling of the pseudoaneurysm. Lantern microcatheter advanced further into the splenic artery and repeat angiogram was performed. There was intermittent filling of the pseudoaneurysm it was very difficult to determine which branch of the splenic artery supplied the pseudoaneurysm. Lantern microcatheter advanced into 1 of the splenic artery branches nearest the pseudoaneurysm and angiogram was performed. Although no direct filling of the pseudoaneurysm was seen, irregular vessels were identified in this area. This artery was embolized with combination of 4 mm Ruby coils. The next closest branch of the splenic artery was selected and angiogram was performed which showed flow into the pseudoaneurysm. The microcatheter was advanced beyond the origin of the pseudoaneurysm and the artery was embolized with 4 mm Ruby coils. The microcatheter was then retracted and embolization was completed with 4 mm Ruby coils. Microcatheter was removed. Splenic angiogram showed no additional flow into the pseudoaneurysm. Sheath angiogram demonstrated appropriate puncture at the level of the common femoral head. The groin was re-prepped and draped and closure was performed with 6 [place] Angio-Seal device. IMPRESSION: 1. 14.0 French drain placed in right abdominal abscess, removing 2700 mL of purulent fluid. 2. Splenic angiogram shows active extravasation from distal splenic branch, 2 of which were coil embolized. The branch responsible for directly supplying the pseudoaneurysm was successfully embolized distal and proximal to  origin of the pseudoaneurysm. Electronically Signed   By: Miachel Roux M.D.   On: 05/10/2022 11:19   CARDIAC CATHETERIZATION  Result Date: 04/17/2022 CONCLUSIONS: Widely patent left main Mid segment of LAD contains 40 to 50% narrowing.  The large first diagonal contains a patent stent in the mid body.  Proximal to the stent is a 50% stenosis. Circumflex is totally occluded beyond the origin of the large first obtuse marginal.  Left-to-right collaterals are noted.  The proximal circumflex before the origin of the first obtuse marginal contains 50 to 70% stenosis.  There is progression of this lesion compared to prior. Right coronary is totally occluded proximally.  It reconstitutes in the distal segment.  PDA is totally occluded.  PDA and left ventricular branches fill via left to right collaterals. Recommendations: Per managing team. Anatomy is similar to prior with the following exceptions: 50% to 70% stenosis proximal to the large first obtuse marginal.  40 to 50% stenosis proximal to the stent segment in the first diagonal.  CT CHEST ABDOMEN PELVIS W CONTRAST  Result Date: 04/17/2022 CLINICAL DATA:  61 year old male with history of malignant looking colon mass on colonoscopy today. Evaluate for metastatic disease. * Tracking Code: BO * EXAM: CT CHEST, ABDOMEN, AND PELVIS WITH CONTRAST TECHNIQUE: Multidetector CT imaging of the chest, abdomen and pelvis was performed following the standard protocol during bolus administration of intravenous contrast. RADIATION DOSE REDUCTION: This exam was performed according to the departmental dose-optimization program which includes automated exposure control, adjustment of the mA and/or kV according to patient size and/or use of iterative reconstruction  technique. CONTRAST:  162m OMNIPAQUE IOHEXOL 300 MG/ML  SOLN COMPARISON:  No priors. FINDINGS: CT CHEST FINDINGS Cardiovascular: Heart size is mildly enlarged with left atrial dilatation. There is no significant  pericardial fluid, thickening or pericardial calcification. There is aortic atherosclerosis, as well as atherosclerosis of the great vessels of the mediastinum and the coronary arteries, including calcified atherosclerotic plaque in the left main, left anterior descending, left circumflex and right coronary arteries. Mild thickening and calcification of the aortic valve. 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 Mediastinum/Nodes: No pathologically enlarged mediastinal or hilar lymph nodes. Esophagus is unremarkable in appearance. No axillary lymphadenopathy. Lungs/Pleura: Small calcified granuloma in the periphery of the left lower lobe. No other definite suspicious appearing pulmonary nodules or masses are noted. Small to moderate left pleural effusion lying dependently. No definite pleural thickening or nodular hyperenhancement. No right pleural effusion. Passive atelectasis in the dependent portion of the left lower lobe. No consolidative airspace disease. Musculoskeletal: Small subcentimeter lucent lesion in the inferior endplate of T9 with sclerotic margins on axial images, favored to represent a small Schmorl's node. There are no other aggressive appearing lytic or blastic lesions noted in the visualized portions of the skeleton. CT ABDOMEN PELVIS FINDINGS Hepatobiliary: Several subcentimeter low-attenuation lesions are noted throughout the inferior aspect of the gallbladder, too small to characterize, but statistically likely to represent tiny cysts. No other larger more suspicious appearing hepatic lesions. No intra or extrahepatic biliary ductal dilatation. 1.0 x 0.5 cm calcification lying dependently in the gallbladder is compatible with a small calcified gallstone. Gallbladder is not distended. Gallbladder wall thickness appears normal. No pericholecystic fluid or surrounding inflammatory changes. Pancreas: There are several  hypovascular appearing masses in the pancreas, largest of which are in the body (axial image 65 of series 3 measuring 5.6 x 5.4 cm) and tail (axial image 58 of series 3 measuring 9.1 x 3.8 cm). A smaller lesion in the inferior aspect of the pancreatic head and uncinate process is also noted (axial image 78 of series 3) measuring 3.4 x 2.1 cm. No pancreatic ductal dilatation. No peripancreatic fluid inflammatory changes. Spleen: Unremarkable. Adrenals/Urinary Tract: Low-attenuation lesions in both kidneys compatible with simple cysts, largest of which measures 2.1 cm in the upper pole of the right kidney. Other subcentimeter low-attenuation lesions in both kidneys are too small to definitively characterize, but statistically likely to represent tiny cysts. No hydroureteronephrosis. Urinary bladder is unremarkable in appearance. Nodularity throughout both adrenal glands, with the largest adrenal nodules measuring up to 2.0 cm on the right (axial image 58 of series 3) and 1.9 cm on the left (axial image 63 of series 3). Stomach/Bowel: The appearance of the stomach is normal. There is no pathologic dilatation of small bowel or colon. Areas of mural thickening and mucosal hyperenhancement are noted in the colon, most evident in the ascending colon where there is a somewhat mass-like appearance within infiltrative mural lesion that is irregular in shape and therefore difficult to discretely measure, but estimated to measure approximately 6.5 x 4.4 x 7.4 cm (axial image 93 of series 3 and coronal image 47 of series 5). There is extensive stranding in the adjacent pericolonic fat suggesting local infiltration of disease. Several prominent but nonenlarged ileocolic lymph nodes are noted. Mildly enlarged ileocolic lymph node measuring 1.4 cm in short axis (axial image 80 of series 3) also noted. Normal appendix. Vascular/Lymphatic: Aortic atherosclerosis with mild fusiform aneurysmal dilatation of the infrarenal  abdominal  aorta which measures up to 3.3 x 3.1 cm (axial image 85 of series 3). 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 Multiple prominent borderline enlarged and mildly enlarged retroperitoneal lymph nodes measuring up to 1.1 cm in short axis in the left para-aortic nodal station inferior to the left renal hilum (axial image 80 of series 3). Reproductive: Prostate gland and seminal vesicles are unremarkable in appearance. Other: Trace volume of ascites. Small amount of presacral edema. No pneumoperitoneum. No pneumoperitoneum. Musculoskeletal: There are no aggressive appearing lytic or blastic lesions noted in the visualized portions of the skeleton. IMPRESSION: 1. 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 2. Bilateral adrenal nodules which are indeterminate, but potentially metastatic. 3. Multiple hypovascular lesions scattered throughout the pancreas. The possibility of metastatic disease to the pancreas should be considered. Alternatively, benign etiologies such as chronic pancreatic pseudocysts could have a similar appearance (no recent prior studies are available for comparison). Close attention on follow-up studies is recommended to ensure the stability or regression of these pancreatic lesions. 4. Small to moderate left pleural effusion lying dependently with some passive subsegmental atelectasis in the left lower lobe. 5. Cardiomegaly with left atrial dilatation. 6. Aortic atherosclerosis, in addition to left main and three-vessel coronary artery disease. Please note that although the presence of coronary artery calcium documents the presence of coronary artery  disease, the severity of this disease and any potential stenosis cannot be assessed on this non-gated CT examination. Assessment for potential risk factor modification, dietary therapy or pharmacologic therapy may be warranted, if clinically indicated. 7. There are calcifications of the aortic valve. Echocardiographic correlation for evaluation of potential valvular dysfunction may be warranted if clinically indicated. 8. Additional incidental findings, as above. Electronically Signed   By: Vinnie Langton M.D.   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 Ultrasound-guided right abdominal abscess drain placement 2. Ultrasound-guided access of right common femoral artery 3. Celiac angiogram 4. Splenic artery angiogram and distal splenic branch embolization (x2) 5. Angio-Seal closure of right common femoral artery access MEDICATIONS: Patient currently on inpatient regiment of IV Zosyn. ANESTHESIA/SEDATION: Moderate (conscious) sedation was employed during this procedure. A total of Versed 1 mg and Fentanyl 50 mcg was administered intravenously by the radiology nurse. Total intra-service moderate Sedation Time: 160 minutes. The patient's level of consciousness and vital signs were monitored continuously by radiology nursing throughout the procedure under my direct supervision. CONTRAST:  130 mL intra arterial Omnipaque 300 FLUOROSCOPY: Radiation Exposure Index (as provided by the fluoroscopic device): 086 mGy Kerma COMPLICATIONS: None immediate. PROCEDURE: Informed consent was obtained from the patient following explanation of the procedure, risks, benefits and  alternatives. The patient understands, agrees and consents for the procedure. All questions were addressed. A time out was performed prior to the initiation of the procedure. Maximal barrier sterile technique utilized including caps, mask, sterile gowns, sterile gloves, large sterile drape, hand hygiene,  and chlorhexidine prep. Right abdominal wall skin prepped and draped in usual fashion. Ultrasound evaluation showed large loculated abscess. Following local lidocaine administration, 19 gauge Yueh needle was advanced into the right abdominal collection using continuous ultrasound guidance. Yueh catheter removed over 0.035 inch guidewire, serial dilation performed, and 14.0 [place] multipurpose pigtail drain placed. 20 mL purulent sample aspirated and sent for Gram stain and culture. Total of 2700 mL of purulent fluid drained from the collection. Drain secured to skin with suture and connected to bag. Right groin skin prepped and draped usual fashion. Ultrasound image documenting patency of the right common femoral artery was obtained and placed in permanent medical record. Sterile ultrasound probe cover and gel utilized throughout the procedure. Utilizing continuous ultrasound guidance, the right common femoral artery was accessed at the level of the femoral head with a 21 gauge needle. 21 gauge needle exchanged for a transitional dilator set over 0.018 inch guidewire. Transitional dilator set exchanged for 5 French sheath over 0.035 inch guidewire. Sos omni catheter utilized to select the celiac artery. 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 Common hepatic, left gastric, and splenic arteries are patent. 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 Lantern microcatheter was advanced into the splenic artery, however the base catheter was pushed out while trying to advance the microcatheter. Sos omni catheter was exchanged for a C2  catheter. C2 catheter advanced into the splenic artery utilizing Glidewire. Splenic angiogram again showed filling of the pseudoaneurysm. Lantern microcatheter advanced further into the splenic artery and repeat angiogram was performed. There was intermittent filling of the pseudoaneurysm it was very difficult to determine which branch of the splenic artery supplied the pseudoaneurysm. Lantern microcatheter advanced into 1 of the splenic artery branches nearest the pseudoaneurysm and angiogram was performed. Although no direct filling of the pseudoaneurysm was seen, irregular vessels were identified in this area. This artery was embolized with combination of 4 mm Ruby coils. The next closest branch of the splenic artery was selected and angiogram was performed which showed flow into the pseudoaneurysm. The microcatheter was advanced beyond the origin of the pseudoaneurysm and the artery was embolized with 4 mm Ruby coils. The microcatheter was then retracted and embolization was completed with 4 mm Ruby coils. Microcatheter was removed. Splenic angiogram showed no additional flow into the pseudoaneurysm. Sheath angiogram demonstrated appropriate puncture at the level of the common femoral head. The groin was re-prepped and draped and closure was performed with 6 [place] Angio-Seal device. IMPRESSION: 1. 14.0 French drain placed in right abdominal abscess, removing 2700 mL of purulent fluid. 2. Splenic angiogram shows active extravasation from distal splenic branch, 2 of which were coil embolized. The branch responsible for directly supplying the pseudoaneurysm was successfully embolized distal and proximal to origin of the pseudoaneurysm. Electronically Signed   By: Miachel Roux M.D.   On: 05/10/2022 11:19   DG Abd 2 Views  Result Date: 05/13/2022 CLINICAL DATA:  The retroperitoneal hemorrhage, left-sided abdominal pain, constipation EXAM: ABDOMEN - 2 VIEW COMPARISON:  CT 05/12/2022 FINDINGS: No free air on erect  radiographs. AICD leads are partially visualized. Atelectasis/consolidation at the left lung base. Stomach appears decompressed. Multiple gas filled small bowel loops throughout the abdomen. There is mild gaseous distention of the transverse colon, remainder of colon is nondilated. Stable pigtail catheter in the right lateral abdomen. Multiple splenic artery embolization coils noted. IMPRESSION: Nonobstructive bowel gas pattern. No free air. Electronically Signed  By: Lucrezia Europe M.D.   On: 05/13/2022 08:28   DG Abdomen Acute W/Chest  Result Date: 05/08/2022 CLINICAL DATA:  Abdominal pain EXAM: DG ABDOMEN ACUTE WITH 1 VIEW CHEST COMPARISON:  Radiograph 04/21/2022 FINDINGS: Diffuse gaseous distension of small and large bowel with some dilated small bowel loops noted. There is no evidence of free intraperitoneal gas. No abnormal calcifications. Cardiomegaly with unchanged pacemaker/AICD leads. There are bibasilar opacities, left greater than right. No large pleural effusion. No visible pneumothorax. There is no acute osseous abnormality. Degenerative changes of the spine. 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 IMPRESSION: Diffuse gaseous distension of small and large bowel with some dilated small bowel loops. Findings are compatible with ileus. Bibasilar airspace opacities, could be atelectasis or infection. Electronically Signed   By: Maurine Simmering M.D.   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 Ultrasound-guided right abdominal abscess drain placement 2. Ultrasound-guided access of right  common femoral artery 3. Celiac angiogram 4. Splenic artery angiogram and distal splenic branch embolization (x2) 5. Angio-Seal closure of right common femoral artery access MEDICATIONS: Patient currently on inpatient regiment of IV Zosyn. ANESTHESIA/SEDATION: Moderate (conscious) sedation was employed during this procedure. A total of Versed 1 mg and Fentanyl 50 mcg was administered intravenously by the radiology nurse. Total intra-service moderate Sedation Time: 160 minutes. The patient's level of consciousness and vital signs were monitored continuously by radiology nursing throughout the procedure under my direct supervision. CONTRAST:  130 mL intra arterial Omnipaque 300 FLUOROSCOPY: Radiation Exposure Index (as provided by the fluoroscopic device): 573 mGy Kerma COMPLICATIONS: None immediate. PROCEDURE: Informed consent was obtained from the patient following explanation of the procedure, risks, benefits and alternatives. The patient understands, agrees and consents for the procedure. All questions were addressed. A time out was performed prior to the initiation of the procedure. Maximal barrier sterile technique utilized including caps, mask, sterile gowns, sterile gloves, large sterile drape, hand hygiene, and chlorhexidine prep. Right abdominal wall skin prepped and draped in usual fashion. Ultrasound evaluation showed large loculated abscess. Following local lidocaine administration, 19 gauge Yueh needle was advanced into the right abdominal collection using continuous ultrasound guidance. Yueh catheter removed over 0.035 inch guidewire, serial dilation performed, and 14.0 [place] multipurpose pigtail drain placed. 20 mL purulent sample aspirated and sent for Gram stain and culture. Total of 2700 mL of purulent fluid drained from the collection. Drain secured to skin with suture and connected to bag. Right groin skin prepped and draped usual fashion. Ultrasound image documenting patency of the right common  femoral artery was obtained and placed in permanent medical record. Sterile ultrasound probe cover and gel utilized throughout the procedure. Utilizing continuous ultrasound guidance, the right common femoral artery was accessed at the level of the femoral head with a 21 gauge needle. 21 gauge needle exchanged for a transitional dilator set over 0.018 inch guidewire. Transitional dilator set exchanged for 5 French sheath over 0.035 inch guidewire. Sos omni catheter utilized to select the celiac artery. 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 Common hepatic, left gastric, and splenic arteries are patent. Active  extravasation noted in the left upper quadrant pseudoaneurysm. Lantern microcatheter was advanced into the splenic artery, however the base catheter was pushed out while trying to advance the microcatheter. Sos omni catheter was exchanged for a C2 catheter. C2 catheter advanced into the splenic artery utilizing Glidewire. Splenic angiogram again showed filling of the pseudoaneurysm. Lantern microcatheter advanced further into the splenic artery and repeat angiogram was performed. There was intermittent filling of the pseudoaneurysm it was very difficult to determine which branch of the splenic artery supplied the pseudoaneurysm. Lantern microcatheter advanced into 1 of the splenic artery branches nearest the pseudoaneurysm and angiogram was performed. Although no direct filling of the pseudoaneurysm was seen, irregular vessels were identified in this area. This artery was embolized with combination of 4 mm Ruby coils. The next closest branch of the splenic artery was selected and angiogram was performed which showed flow into the pseudoaneurysm. The microcatheter was advanced beyond the origin of the pseudoaneurysm and the artery was embolized with 4 mm Ruby coils. The microcatheter was then retracted and embolization was completed with 4 mm Ruby coils.  Microcatheter was removed. Splenic angiogram showed no additional flow into the pseudoaneurysm. Sheath angiogram demonstrated appropriate puncture at the level of the common femoral head. The groin was re-prepped and draped and closure was performed with 6 [place] Angio-Seal device. IMPRESSION: 1. 14.0 French drain placed in right abdominal abscess, removing 2700 mL of purulent fluid. 2. Splenic angiogram shows active extravasation from distal splenic branch, 2 of which were coil embolized. The branch responsible for directly supplying the pseudoaneurysm was successfully embolized distal and proximal to origin of the pseudoaneurysm. Electronically Signed   By: Miachel Roux M.D.   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 Ultrasound-guided right abdominal abscess drain placement 2. Ultrasound-guided access of right common femoral artery 3. Celiac angiogram 4. Splenic artery angiogram and distal splenic branch embolization (x2) 5. Angio-Seal closure of right common femoral artery access MEDICATIONS: Patient currently on inpatient regiment of IV Zosyn. ANESTHESIA/SEDATION: Moderate (conscious) sedation was employed during this procedure. A total of Versed 1 mg and Fentanyl 50 mcg was administered intravenously by the radiology nurse. Total intra-service moderate Sedation Time: 160 minutes. The patient's level of consciousness and vital signs were monitored continuously by radiology nursing throughout the procedure under my direct supervision. CONTRAST:  130 mL intra arterial Omnipaque 300 FLUOROSCOPY: Radiation Exposure Index (as  provided by the fluoroscopic device): 503 mGy Kerma COMPLICATIONS: None immediate. PROCEDURE: Informed consent was obtained from the patient following explanation of the procedure, risks, benefits and alternatives. The patient understands, agrees and consents for the procedure. All questions were addressed. A time out was performed prior to the initiation of the procedure. Maximal barrier sterile technique utilized including caps, mask, sterile gowns, sterile gloves, large sterile drape, hand hygiene, and chlorhexidine prep. Right abdominal wall skin prepped and draped in usual fashion. Ultrasound evaluation showed large loculated abscess. Following local lidocaine administration, 19 gauge Yueh needle was advanced into the right abdominal collection using continuous ultrasound guidance. Yueh catheter removed over 0.035 inch guidewire, serial dilation performed, and 14.0 [place] multipurpose pigtail drain placed. 20 mL purulent sample aspirated and sent for  Gram stain and culture. Total of 2700 mL of purulent fluid drained from the collection. Drain secured to skin with suture and connected to bag. Right groin skin prepped and draped usual fashion. Ultrasound image documenting patency of the right common femoral artery was obtained and placed in permanent medical record. Sterile ultrasound probe cover and gel utilized throughout the procedure. Utilizing continuous ultrasound guidance, the right common femoral artery was accessed at the level of the femoral head with a 21 gauge needle. 21 gauge needle exchanged for a transitional dilator set over 0.018 inch guidewire. Transitional dilator set exchanged for 5 French sheath over 0.035 inch guidewire. Sos omni catheter utilized to select the celiac artery. 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 Common hepatic, left gastric, and splenic arteries are patent. Active extravasation noted in the left upper quadrant  pseudoaneurysm. Lantern microcatheter was advanced into the splenic artery, however the base catheter was pushed out while trying to advance the microcatheter. Sos omni catheter was exchanged for a C2 catheter. C2 catheter advanced into the splenic artery utilizing Glidewire. Splenic angiogram again showed filling of the pseudoaneurysm. Lantern microcatheter advanced further into the splenic artery and repeat angiogram was performed. There was intermittent filling of the pseudoaneurysm it was very difficult to determine which branch of the splenic artery supplied the pseudoaneurysm. Lantern microcatheter advanced into 1 of the splenic artery branches nearest the pseudoaneurysm and angiogram was performed. Although no direct filling of the pseudoaneurysm was seen, irregular vessels were identified in this area. This artery was embolized with combination of 4 mm Ruby coils. The next closest branch of the splenic artery was selected and angiogram was performed which showed flow into the pseudoaneurysm. The microcatheter was advanced beyond the origin of the pseudoaneurysm and the artery was embolized with 4 mm Ruby coils. The microcatheter was then retracted and embolization was completed with 4 mm Ruby coils. Microcatheter was removed. Splenic angiogram showed no additional flow into the pseudoaneurysm. Sheath angiogram demonstrated appropriate puncture at the level of the common femoral head. The groin was re-prepped and draped and closure was performed with 6 [place] Angio-Seal device. IMPRESSION: 1. 14.0 French drain placed in right abdominal abscess, removing 2700 mL of purulent fluid. 2. Splenic angiogram shows active extravasation from distal splenic branch, 2 of which were coil embolized. The branch responsible for directly supplying the pseudoaneurysm was successfully embolized distal and proximal to origin of the pseudoaneurysm. Electronically Signed   By: Miachel Roux M.D.   On: 05/10/2022 11:19   VAS [place]  LOWER EXTREMITY VENOUS (DVT)  Result Date: 05/13/2022  Lower Venous DVT Study Patient Name:  CHANDON LAZCANO  Date of Exam:   05/12/2022 Medical Rec #: [id number]                  Accession #:    [id number] Date of Birth: 1961/04/06                  Patient Gender: M Patient Age:   39 years Exam Location:  [facility] Procedure:      VAS [place] LOWER EXTREMITY VENOUS (DVT) Referring Phys: Shawna Clamp --------------------------------------------------------------------------------  Indications: Asymmetrical swelling- left leg.  Risk Factors: Cancer - right colon. Comparison Study: No prior lower extremity studies. Performing Technologist: Darlin Coco RDMS, RVT  Examination Guidelines: A complete evaluation includes B-mode imaging, spectral Doppler, color Doppler, and power Doppler as needed of all accessible portions of each vessel. Bilateral  testing is considered an integral part of a complete examination. Limited examinations for reoccurring indications may be performed as noted. The reflux portion of the exam is performed with the patient in reverse Trendelenburg.  +-----+---------------+---------+-----------+----------+--------------+ RIGHTCompressibilityPhasicitySpontaneityPropertiesThrombus Aging +-----+---------------+---------+-----------+----------+--------------+ CFV  Full           Yes      Yes                                 +-----+---------------+---------+-----------+----------+--------------+   +---------+---------------+---------+-----------+----------+--------------+ LEFT     CompressibilityPhasicitySpontaneityPropertiesThrombus Aging +---------+---------------+---------+-----------+----------+--------------+ CFV      Full           Yes      Yes                                 +---------+---------------+---------+-----------+----------+--------------+ SFJ      Full                                                         +---------+---------------+---------+-----------+----------+--------------+ FV Prox  Full                                                        +---------+---------------+---------+-----------+----------+--------------+ FV Mid   Full                                                        +---------+---------------+---------+-----------+----------+--------------+ FV DistalFull                                                        +---------+---------------+---------+-----------+----------+--------------+ PFV      Full                                                        +---------+---------------+---------+-----------+----------+--------------+ POP      Full           Yes      Yes                                 +---------+---------------+---------+-----------+----------+--------------+ PTV      Full                                                        +---------+---------------+---------+-----------+----------+--------------+ PERO     Full                                                        +---------+---------------+---------+-----------+----------+--------------+  Gastroc  Full                                                        +---------+---------------+---------+-----------+----------+--------------+     Summary: RIGHT: - No evidence of common femoral vein obstruction.  LEFT: - There is no evidence of deep vein thrombosis in the lower extremity.  - No cystic structure found in the popliteal fossa.  *See table(s) above for measurements and observations. Electronically signed by Deitra Mayo MD on 05/13/2022 at 10:52:47 AM.    Final    VAS [place] UPPER EXTREMITY VENOUS DUPLEX  Result Date: 04/21/2022 UPPER VENOUS STUDY  Patient Name:  [name]  Date of Exam:   04/21/2022 Medical Rec #: [id number]                  Accession #:    [id number] Date of Birth: October 05, 1961                  Patient Gender: M Patient Age:   75 years Exam  Location:  [facility] Procedure:      VAS [place] UPPER EXTREMITY VENOUS DUPLEX Referring Phys: ANAND HONGALGI --------------------------------------------------------------------------------  Indications: swelling, suspecting IV infitration Comparison Study: no prior Performing Technologist: Archie Patten RVS  Examination Guidelines: A complete evaluation includes B-mode imaging, spectral Doppler, color Doppler, and power Doppler as needed of all accessible portions of each vessel. Bilateral testing is considered an integral part of a complete examination. Limited examinations for reoccurring indications may be performed as noted.  Right Findings: +----------+------------+---------+-----------+----------+-------+ RIGHT     CompressiblePhasicitySpontaneousPropertiesSummary +----------+------------+---------+-----------+----------+-------+ Subclavian    Full       Yes       Yes                      +----------+------------+---------+-----------+----------+-------+  Left Findings: +----------+------------+---------+-----------+----------+-----------------+ LEFT      CompressiblePhasicitySpontaneousProperties     Summary      +----------+------------+---------+-----------+----------+-----------------+ IJV           Full       Yes       Yes                                +----------+------------+---------+-----------+----------+-----------------+ Subclavian    Full       Yes       Yes                                +----------+------------+---------+-----------+----------+-----------------+ Axillary      Full       Yes       Yes                                +----------+------------+---------+-----------+----------+-----------------+ Brachial      Full       Yes       Yes                                +----------+------------+---------+-----------+----------+-----------------+ Radial        Full                                                     +----------+------------+---------+-----------+----------+-----------------+  Ulnar         Full                                                    +----------+------------+---------+-----------+----------+-----------------+ Cephalic      Full                                                    +----------+------------+---------+-----------+----------+-----------------+ Basilic       None                                  Age Indeterminate +----------+------------+---------+-----------+----------+-----------------+  Summary:  Right: No evidence of thrombosis in the subclavian.  Left: No evidence of deep vein thrombosis in the upper extremity. Findings consistent with age indeterminate superficial vein thrombosis involving the left basilic vein.  *See table(s) above for measurements and observations.  Diagnosing physician: Servando Snare MD Electronically signed by Servando Snare MD on 04/21/2022 at 7:50:44 PM.    Final     Labs:  CBC: Recent Labs    05/10/22 0315 05/11/22 0251 05/12/22 0116 05/13/22 0705  WBC 28.8* 28.6* 22.1* 17.7*  HGB 12.1* 10.7* 10.4* 10.3*  HCT 39.8 34.5* 34.8* 35.2*  PLT 407* 337 349 334     COAGS: Recent Labs    05/08/22 1637 05/09/22 1325  INR 1.7* 1.6*     BMP: Recent Labs    05/10/22 0315 05/10/22 0636 05/11/22 0251 05/12/22 0116 05/13/22 0705  NA 128*  --  129* 131* 130*  K 5.2* 5.1 4.7 4.4 4.1  CL 98  --  99 98 100  CO2 19*  --  '22 26 27  '$ GLUCOSE 119*  --  127* 108* 131*  BUN 22  --  15 9 7*  CALCIUM 7.8*  --  7.7* 7.8* 7.6*  CREATININE 0.86  --  0.73 0.71 0.84  GFRNONAA >60  --  >60 >60 >60     LIVER FUNCTION TESTS: Recent Labs    05/10/22 0315 05/11/22 0251 05/12/22 0116 05/13/22 0705  BILITOT 0.7 0.5 0.3 0.1*  AST 56* 70* 80* 70*  ALT 43 44 52* 56*  ALKPHOS 114 156* 174* 174*  PROT 5.7* 5.2* 5.4* 5.5*  ALBUMIN 1.6* <1.5* <1.5* <1.5*     TUMOR MARKERS: No results for input(s): AFPTM, CEA, CA199, CHROMGRNA in  the last 8760 hours.  Assessment and Plan:  61 yo male s/p biopsy with hand-assisted laparoscopic right colectomy 5/5 as well as cardiac catheterization, who developed intraabdominal abscess and pseudoaneurysm along the pancreatic tail/splenic hilum, within a mildly progressive pancreatic pseudocyst, s/p RLQ drain placement and selective embo by Dr. Dwaine Gale on 05/08/22. F/U CT showed persistent intraabd abscess.   IR was requested for additional drain placement.  Case reviewed by Dr. Serafina Royals, who recommends reposition and exchange of the existing drain with sedation.   Risks and benefits discussed with the patient including bleeding, infection, damage to adjacent structures, bowel perforation/fistula connection, and sepsis.  All of the patient's questions were answered, patient is agreeable to proceed. Consent signed and in chart.  PLAN - the procedure is tentatively scheduled for tomorrow pending IR  schedule.  - NPO at [facility]  - IR will call for the patient when ready   Thank you for this interesting consult.  I greatly enjoyed meeting [facility] and look forward to participating in their care.  A copy of this report was sent to the requesting provider on this date.  Electronically Signed: Tera Mater, PA-C 05/13/2022, 4:48 PM   I spent a total of 20 Minutes    in face to face in clinical consultation, greater than 50% of which was counseling/coordinating care for intra-abdominal fluid collection, splenic artery pseudoaneurysm.

## 2022-05-13 NOTE — Progress Notes (Signed)
PROGRESS NOTE    Gregory [name]  VOJ:[id number] DOB: [birth year] DOA: 05/08/2022 PCP: Patient, [name]   Brief Narrative:  61 years old male with PMH significant for CAD s/p stenting, chronic systolic heart failure, recurrent VT with ICD placement, atrial fibrillation, diabetes mellitus type 2, history of seizures, hypertension presented in the ED after his AICD gave him a shock.  He felt his heart rate was elevated and then he felt like a shock so called 911.  Patient denies any chest pain or shortness of breath.  He does have history of recurrent VTE.  Patient had recent abdominal surgery , states abdominal pain has not improved.  Patient denies any discharge from his incision. He was recently hospitalized from 4/28 - 5/12 for symptomatic anemia. 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  Metastatic involving lymph nodes.  Patient underwent hand-assisted laparoscopic right colectomy on 04/18/2022 so far has received 7 units of PRBCs.  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  IR is consulted.  Patient underwent percutaneous drain and embolization of splenic artery for pseudoaneurysm.  Patient tolerated procedure well.  Patient started on IV antibiotics.  Patient continued to have elevated white cell count despite being on antibiotics. Repeat CT scan shows decreased size of abscess with moderately large collection of gas and fluid remaining.  IR is consulted.  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  Active Problems:   Ileus following gastrointestinal surgery (HCC)   Hypothyroidism   Leukocytosis   Cancer of right colon (HCC)    Chronic combined systolic and diastolic CHF (congestive heart failure) (HCC)/ischemic cardiomyopathy   CAD S/P percutaneous coronary angioplasty   Paroxysmal atrial fibrillation (HCC)   Controlled type 2 diabetes mellitus without complication, without long-term current use of insulin (HCC)   Essential hypertension   Seizures (HCC)   OSA on CPAP  Recurrent VT/  s/p AICD shock: Patient with history of recurrent VT reports AICD firing, admitted after shock from device.. Patient denies any chest pain.  EKG shows normal sinus rhythm. EP physician consulted.  Patient is started on amiodarone gtt. -TSH abnormal Cardiologist states patient will continue to have episodes of V. tach until primary infection resolves. Continued on amiodarone infusion. Plan is to switch to PO amiodarone once acute infection improves /controlled.   Ileus status post gastric surgery: Intra-abdominal abscess. S/p hand-assisted laparoscopic right colectomy on 04/18/22. Had post operative ileus during last hospital stay. Now presenting with abdominal pain, last BM 2 days ago and xray finding of ileus General surgery has been consulted. 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 Held  eliquis/plavix until IR intervention. Adequate pain control with oxycodone and morphine as needed for breakthrough pain. Patient underwent successful percutaneous drain, and embolization of the splenic artery. Continue IV Zosyn for now.  Continue to have high white cell count despite being on antibiotic. Repeat imaging shows persistent but decreasing size of abscess.  IR is consulted.  Leukocytosis: Likely secondary to intra-abdominal abscess. Lactic acid 1.7. WBC trending down but still elevated. Continued on IV Zosyn.  Cultures growing E. coli and strep anginosus sensitive to Unasyn. Antibiotic changed with IV Unasyn.  Continue to monitor WBC.   Hypothyroidism:  Could be sick thyroid or  due to  amiodarone. Free T4 wnl Would repeat levels in 1-2 weeks.   Cancer of right colon [facility]) Recently diagnosed at previous hospitalization. 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  He has follow up with oncology where PET scan is planned and discuss treatment options.   Chronic combined systolic and diastolic CHF: Echo 06/13/5283: LVEF 35-40%, worse compared to 09/22/2020 (LVEF 40-45%).  Clinically euvolemic.  Continue farxiga Metoprolol XL and Entresto is on hold with soft bp/hypotension.   Paroxysmal atrial fibrillation (HCC) Patient remains in normal sinus rhythm now. Continue amiodarone IV for now,  change to p.o. once acute infection improves or controlled. Eliquis resumed but discontinued again, patient may require procedure.  CAD S/P percutaneous coronary angioplasty S/p LHC on 04/17/22 recommendations:  Anatomy is similar to prior with the following exceptions: 50% to 70% stenosis proximal to the large first obtuse marginal.  40 to 50% stenosis proximal to the stent segment in the first diagonal. He denies any chest pain, troponin mildly elevated. Continue medical management.  Resume Plavix tomorrow.   Type 2 diabetes without complication. Hb A1c 5.1.  Well-controlled. Continue farxiga for CHF SSI and accuchecks per protocol.    Essential hypertension Soft bp, systolic <132.  Has not had any medication in 1-2 days Hold entrestro, toprol-xl and lasix    Seizures (HCC) Continue keppra. Seizure precautions    OSA on CPAP Uses cpap at night  Continue qhs.  Lt. leg swelling: Venous duplex negative for DVT.   DVT prophylaxis: TED hose Code Status: Full code. Family Communication:No family at bed side. Disposition Plan:    Status is: Inpatient Remains inpatient appropriate because: Admitted for  AICD shock found to have intra-abdominal abscess and pseudoaneurysm of splenic artery .  Patient underwent percutaneous drain and embolization of the splenic artery.  Repeat CT scan shows decreasing but persistent abdominal abscess.  IR is reconsulted.  Consultants:  General surgery, IR,  Procedures: CT abdomen pelvis Antimicrobials:  Anti-infectives (From admission, onward)    Start     Dose/Rate Route Frequency Ordered Stop   05/13/22 1800  Ampicillin-Sulbactam (UNASYN) 3 g in sodium chloride 0.9 % 100 mL IVPB        3 g 200 mL/hr over 30 Minutes Intravenous Every 6 hours 05/13/22 1136     05/08/22 2200  piperacillin-tazobactam (ZOSYN) IVPB 3.375 g  Status:  Discontinued        3.375 g 12.5 mL/hr over 240 Minutes Intravenous Every 8 hours 05/08/22 2008 05/13/22 1136       Subjective: Patient was seen and examined at bedside.  Overnight events noted.  Patient reports abdominal distention.  He also complains of left abdominal pain. Patient reports nausea and vomiting has improved.  He has not had a bowel movement yet. He wants to try tapwater enema today.   Objective: Vitals:   05/12/22 2246 05/13/22 0434 05/13/22 0511 05/13/22 1100  BP:   118/73   Pulse: 76 82 73   Resp:   16   Temp:   98.4 F (36.9 C)   TempSrc:   Oral   SpO2: 95% 93% 93% 97%  Weight:      Height:        Intake/Output Summary (Last 24 hours) at 05/13/2022 1202 Last data filed at 05/13/2022 0500 Gross per 24 hour  Intake 15 ml  Output 900 ml  Net -  885 ml   Filed Weights   05/08/22 0841 05/08/22 1626  Weight: 95.7 kg 102.1 kg    Examination:  General exam: Appears chronically ill looking, deconditioned, not in any distress. Respiratory system: CTA bilaterally, respiratory effort normal, RR 17 Cardiovascular system: S1-S2 heard, regular rate and rhythm, no murmur. Gastrointestinal system: Abdomen is soft, distended, mildly tender, normal bowel sounds, PCT drain. Central nervous system: Alert and  oriented X 3 . No focal neurological deficits. Extremities: Edema+, no cyanosis, no clubbing. Left leg is still swollen but improving Skin: No rashes, lesions or ulcers Psychiatry: Judgement and insight appear normal. Mood & affect appropriate.     Data Reviewed: I have personally reviewed following labs and imaging studies  CBC: Recent Labs  Lab 05/08/22 0918 05/09/22 0230 05/10/22 0315 05/11/22 0251 05/12/22 0116 05/13/22 0705  WBC 13.2* 17.9* 28.8* 28.6* 22.1* 17.7*  NEUTROABS 11.8*  --   --   --   --   --   HGB 14.3 13.5 12.1* 10.7* 10.4* 10.3*  HCT 48.5 46.1 39.8 34.5* 34.8* 35.2*  MCV 86.1 82.8 82.4 82.3 82.7 82.8  PLT 473* 438* 407* 337 349 673   Basic Metabolic Panel: Recent Labs  Lab 05/08/22 0918 05/09/22 0615 05/10/22 0315 05/10/22 0636 05/11/22 0251 05/12/22 0116 05/13/22 0705  NA 135 130* 128*  --  129* 131* 130*  K 5.0 4.8 5.2* 5.1 4.7 4.4 4.1  CL 104 100 98  --  99 98 100  CO2 16* 22 19*  --  '22 26 27  '$ GLUCOSE 115* 95 119*  --  127* 108* 131*  BUN 25* 25* 22  --  15 9 7*  CREATININE 1.14 0.96 0.86  --  0.73 0.71 0.84  CALCIUM 8.3* 7.9* 7.8*  --  7.7* 7.8* 7.6*  MG 2.6* 2.2 2.1  --  2.1  --  2.1  PHOS  --   --  4.3  --  2.7  --   --    GFR: Estimated Creatinine Clearance: 116.4 mL/min (by C-G formula based on SCr of 0.84 mg/dL). Liver Function Tests: Recent Labs  Lab 05/10/22 0315 05/11/22 0251 05/12/22 0116 05/13/22 0705  AST 56* 70* 80* 70*  ALT 43 44 52* 56*  ALKPHOS 114 156* 174* 174*  BILITOT 0.7 0.5 0.3 0.1*  PROT 5.7* 5.2* 5.4* 5.5*  ALBUMIN 1.6* <1.5* <1.5* <1.5*   No results for input(s): LIPASE, AMYLASE in the last 168 hours. No results for input(s): AMMONIA in the last 168 hours. Coagulation Profile: Recent Labs  Lab 05/08/22 1637 05/09/22 1325  INR 1.7* 1.6*   Cardiac Enzymes: No results for input(s): CKTOTAL, CKMB, CKMBINDEX, TROPONINI in the last 168 hours. BNP (last 3 results) No results for input(s): PROBNP in the  last 8760 hours. HbA1C: No results for input(s): HGBA1C in the last 72 hours.  CBG: Recent Labs  Lab 05/11/22 2149 05/12/22 0801 05/12/22 1458 05/12/22 1759 05/13/22 0743  GLUCAP 109* 97 121* 106* 122*   Lipid Profile: No results for input(s): CHOL, HDL, LDLCALC, TRIG, CHOLHDL, LDLDIRECT in the last 72 hours. Thyroid Function Tests: No results for input(s): TSH, T4TOTAL, FREET4, T3FREE, THYROIDAB in the last 72 hours.  Anemia Panel: No results for input(s): VITAMINB12, FOLATE, FERRITIN, TIBC, IRON, RETICCTPCT in the last 72 hours. Sepsis Labs: Recent Labs  Lab 05/08/22 1637 05/08/22 2017  LATICACIDVEN 1.9 1.3    Recent Results (from the past 240 hour(s))  Aerobic/Anaerobic Culture w Gram Stain (surgical/deep wound)  Status: None (Preliminary result)   Collection Time: 05/09/22  5:38 PM   Specimen: Abscess  Result Value Ref Range Status   Specimen Description ABSCESS  Final   Special Requests NONE  Final   Gram Stain   Final    ABUNDANT WBC PRESENT,BOTH PMN AND MONONUCLEAR FEW GRAM POSITIVE COCCI IN CLUSTERS FEW GRAM POSITIVE COCCI IN CHAINS RARE GRAM NEGATIVE RODS    Culture   Final    ABUNDANT STREPTOCOCCUS ANGINOSIS MODERATE ESCHERICHIA COLI HOLDING FOR POSSIBLE ANAEROBE    Report Status PENDING  Incomplete   Organism ID, Bacteria ESCHERICHIA COLI  Final   Organism ID, Bacteria STREPTOCOCCUS ANGINOSIS  Final      Susceptibility   Escherichia coli - MIC*    AMPICILLIN 8 SENSITIVE Sensitive     CEFAZOLIN <=4 SENSITIVE Sensitive     CEFEPIME <=0.12 SENSITIVE Sensitive     CEFTAZIDIME <=1 SENSITIVE Sensitive     CEFTRIAXONE <=0.25 SENSITIVE Sensitive     CIPROFLOXACIN <=0.25 SENSITIVE Sensitive     GENTAMICIN <=1 SENSITIVE Sensitive     IMIPENEM <=0.25 SENSITIVE Sensitive     TRIMETH/SULFA <=20 SENSITIVE Sensitive     AMPICILLIN/SULBACTAM <=2 SENSITIVE Sensitive     PIP/TAZO <=4 SENSITIVE Sensitive     * MODERATE ESCHERICHIA COLI   Streptococcus  anginosis - MIC*    PENICILLIN <=0.06 SENSITIVE Sensitive     CEFTRIAXONE <=0.12 SENSITIVE Sensitive     ERYTHROMYCIN 4 RESISTANT Resistant     LEVOFLOXACIN 0.5 SENSITIVE Sensitive     VANCOMYCIN Value in next row Sensitive      0.5 SENSITIVEPerformed at [facility], [address] [place], [place] [address]    * ABUNDANT STREPTOCOCCUS ANGINOSIS     Radiology Studies: CT ABDOMEN PELVIS W CONTRAST  Result Date: 05/12/2022 CLINICAL DATA:  Follow-up percutaneously drained abdominal abscess performed on 05/09/2022. 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 EXAM: CT ABDOMEN AND PELVIS WITH CONTRAST TECHNIQUE: Multidetector CT imaging of the abdomen and pelvis was performed using the standard protocol following bolus administration of intravenous contrast. RADIATION DOSE REDUCTION: This exam was performed according to the departmental dose-optimization program which includes automated exposure control, adjustment of the mA and/or kV according to patient size and/or use of iterative reconstruction technique. CONTRAST:  178m OMNIPAQUE IOHEXOL 300 MG/ML  SOLN COMPARISON:  06/03/2022 and 05/08/2022. FINDINGS: Lower chest: Moderate-sized left pleural effusion with a mild increase in size. Stable small right pleural effusion. Increased left lower lobe atelectasis. Stable right lower lobe atelectasis. The heart remains enlarged, especially the left atrium. Right ventricular AICD leads are again demonstrated. Atheromatous calcifications, including the coronary arteries and aorta. Hepatobiliary: Multiple small liver cysts. Sludge and 4 mm gallstone in the dependent portion of the gallbladder. No gallbladder wall thickening. Minimal pericholecystic fluid. Pancreas: No significant change in the previously demonstrated bilobed fluid collection in the region of  the pancreatic tail with an oval area of persistent higher density laterally. Interval embolization coil artifacts at that location. Spleen: Anterior, superior splenic infarct. The remainder of the spleen is unremarkable. Adrenals/Urinary Tract: Stable mild bilateral adrenal hyperplasia. Bilateral renal cysts. Poorly distended bladder with mild to moderate diffuse wall thickening. Unremarkable ureters. Mild urinary tract calculi or hydronephrosis. Stomach/Bowel: Multiple mildly to moderately dilated loops of small bowel and transverse colon. Small number of sigmoid colon diverticula. Unremarkable stomach. 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 No enlarged lymph nodes. Reproductive: Prostate is unremarkable. Other: Interval right lateral percutaneous drainage catheter within the lateral aspect of the previously demonstrated 25.0 x 15.5 cm fluid and gas collection in the right mid abdomen and extending superiorly lateral to the liver. That collection is significantly smaller, measuring 14.9 x 7.7 cm at the same level on image number 60/3. In the upper pelvis, this measures 19.4 x 6.9 cm on image number 73/3, previously 28.2 x 14.8 cm. A left lateral fluid collection is more oval in configuration with a mild increase in size. This communicates with the pelvic portion of the larger, drained collection, currently with a thin surrounding enhancing rim. This collection measures 15.9 x 5.9 cm on image number 49/3. On 05/09/2022, this measured 10.9 x 4.5 cm in corresponding dimensions. Small amount of free peritoneal fluid.  Diffuse subcutaneous edema. Musculoskeletal: Lumbar and lower thoracic spine degenerative changes. IMPRESSION: 1. Interval significant decrease in size of the large abdominal and pelvic abscess with a moderately large collection of gas and fluid remaining. 2. Small amount of ascites.  3. Cholelithiasis and mild sludge in the gallbladder without evidence of cholecystitis. 4. Moderate-sized left pleural effusion, mildly increased. 5. Small right pleural effusion, unchanged. 6. Bilateral lower lobe atelectasis, increased on the left. 7. Diffuse subcutaneous edema. Electronically Signed   By: Claudie Revering M.D.   On: 05/12/2022 12:24   DG Abd 2 Views  Result Date: 05/13/2022 CLINICAL DATA:  The retroperitoneal hemorrhage, left-sided abdominal pain, constipation EXAM: ABDOMEN - 2 VIEW COMPARISON:  CT 05/12/2022 FINDINGS: No free air on erect radiographs. AICD leads are partially visualized. Atelectasis/consolidation at the left lung base. Stomach appears decompressed. Multiple gas filled small bowel loops throughout the abdomen. There is mild gaseous distention of the transverse colon, remainder of colon is nondilated. Stable pigtail catheter in the right lateral abdomen. Multiple splenic artery embolization coils noted. IMPRESSION: Nonobstructive bowel gas pattern. No free air. Electronically Signed   By: Lucrezia Europe M.D.   On: 05/13/2022 08:28   VAS [place] LOWER EXTREMITY VENOUS (DVT)  Result Date: 05/13/2022  Lower Venous DVT Study Patient Name:  Gregory [name]  Date of Exam:   05/12/2022 Medical Rec #: [id number]                  Accession #:    [id number] Date of Birth: 11-Aug-1961                  Patient Gender: M Patient Age:   51 years Exam Location:  [facility] Procedure:      VAS [place] LOWER EXTREMITY VENOUS (DVT) Referring Phys: Shawna Clamp --------------------------------------------------------------------------------  Indications: Asymmetrical swelling- left leg.  Risk Factors: Cancer - right colon. Comparison Study: No prior lower extremity studies. Performing Technologist: Darlin Coco RDMS, RVT  Examination Guidelines: A complete evaluation includes B-mode imaging, spectral Doppler, color Doppler, and power Doppler as needed of all accessible portions of each vessel.  Bilateral testing is considered an integral part of a complete examination. Limited examinations for reoccurring indications may be performed as noted. The reflux portion of the exam is performed with the patient in reverse Trendelenburg.  +-----+---------------+---------+-----------+----------+--------------+ RIGHTCompressibilityPhasicitySpontaneityPropertiesThrombus Aging +-----+---------------+---------+-----------+----------+--------------+ CFV  Full           Yes      Yes                                 +-----+---------------+---------+-----------+----------+--------------+   +---------+---------------+---------+-----------+----------+--------------+  LEFT     CompressibilityPhasicitySpontaneityPropertiesThrombus Aging +---------+---------------+---------+-----------+----------+--------------+ CFV      Full           Yes      Yes                                 +---------+---------------+---------+-----------+----------+--------------+ SFJ      Full                                                        +---------+---------------+---------+-----------+----------+--------------+ FV Prox  Full                                                        +---------+---------------+---------+-----------+----------+--------------+ FV Mid   Full                                                        +---------+---------------+---------+-----------+----------+--------------+ FV DistalFull                                                        +---------+---------------+---------+-----------+----------+--------------+ PFV      Full                                                        +---------+---------------+---------+-----------+----------+--------------+ POP      Full           Yes      Yes                                 +---------+---------------+---------+-----------+----------+--------------+ PTV      Full                                                         +---------+---------------+---------+-----------+----------+--------------+ PERO     Full                                                        +---------+---------------+---------+-----------+----------+--------------+ Gastroc  Full                                                        +---------+---------------+---------+-----------+----------+--------------+  Summary: RIGHT: - No evidence of common femoral vein obstruction.  LEFT: - There is no evidence of deep vein thrombosis in the lower extremity.  - No cystic structure found in the popliteal fossa.  *See table(s) above for measurements and observations. Electronically signed by Deitra Mayo MD on 05/13/2022 at 10:52:47 AM.    Final     Scheduled Meds:  atorvastatin  80 mg Oral QPM   dapagliflozin propanediol  10 mg Oral Daily   docusate sodium  100 mg Oral BID   ezetimibe  10 mg Oral Daily   insulin aspart  0-9 Units Subcutaneous TID WC   levETIRAcetam  500 mg Oral BID   pantoprazole  40 mg Oral BID   polyethylene glycol  17 g Oral Daily   sodium chloride flush  3 mL Intravenous Q12H   sodium chloride flush  5 mL Intracatheter Q8H   Continuous Infusions:  sodium chloride     amiodarone 30 mg/hr (05/13/22 0725)   ampicillin-sulbactam (UNASYN) IV       LOS: 5 days    Time spent: 35 mins    Zadia Uhde, MD [facility]   If 7PM-7AM, please contact night-coverage

## 2022-05-14 ENCOUNTER — Inpatient Hospital Stay (HOSPITAL_COMMUNITY): Payer: Commercial Managed Care - HMO

## 2022-05-14 DIAGNOSIS — I472 Ventricular tachycardia, unspecified: Secondary | ICD-10-CM | POA: Diagnosis not present

## 2022-05-14 HISTORY — PX: IR CATHETER TUBE CHANGE: IMG717

## 2022-05-14 LAB — GLUCOSE, CAPILLARY
Glucose-Capillary: 119 mg/dL — ABNORMAL HIGH (ref 70–99)
Glucose-Capillary: 90 mg/dL (ref 70–99)
Glucose-Capillary: 73 mg/dL (ref 70–99)
Glucose-Capillary: 132 mg/dL — ABNORMAL HIGH (ref 70–99)

## 2022-05-14 LAB — COMPREHENSIVE METABOLIC PANEL
ALT: 55 U/L — ABNORMAL HIGH (ref 0–44)
AST: 69 U/L — ABNORMAL HIGH (ref 15–41)
Albumin: <1.5 g/dL — ABNORMAL LOW (ref 3.5–5.0)
Alkaline Phosphatase: 164 U/L — ABNORMAL HIGH (ref 38–126)
Anion gap: 5 (ref 5–15)
BUN: 5 mg/dL — ABNORMAL LOW (ref 8–23)
CO2: 26 mmol/L (ref 22–32)
Calcium: 7.7 mg/dL — ABNORMAL LOW (ref 8.9–10.3)
Chloride: 99 mmol/L (ref 98–111)
Creatinine, Ser: 0.74 mg/dL (ref 0.61–1.24)
GFR, Estimated: >60 mL/min (ref >60)
Glucose, Bld: 109 mg/dL — ABNORMAL HIGH (ref 70–99)
Potassium: 4.4 mmol/L (ref 3.5–5.1)
Sodium: 130 mmol/L — ABNORMAL LOW (ref 135–145)
Total Bilirubin: 0.2 mg/dL — ABNORMAL LOW (ref 0.3–1.2)
Total Protein: 5.6 g/dL — ABNORMAL LOW (ref 6.5–8.1)

## 2022-05-14 LAB — CBC
HCT: 32.4 % — ABNORMAL LOW (ref 39.0–52.0)
Hemoglobin: 9.8 g/dL — ABNORMAL LOW (ref 13.0–17.0)
MCH: 25.1 pg — ABNORMAL LOW (ref 26.0–34.0)
MCHC: 30.2 g/dL (ref 30.0–36.0)
MCV: 82.9 fL (ref 80.0–100.0)
Platelets: 323 10*3/uL (ref 150–400)
RBC: 3.91 MIL/uL — ABNORMAL LOW (ref 4.22–5.81)
RDW: 22.5 % — ABNORMAL HIGH (ref 11.5–15.5)
WBC: 22.5 10*3/uL — ABNORMAL HIGH (ref 4.0–10.5)
nRBC: 0 % (ref 0.0–0.2)

## 2022-05-14 LAB — PHOSPHORUS: Phosphorus: 2.3 mg/dL — ABNORMAL LOW (ref 2.5–4.6)

## 2022-05-14 LAB — MAGNESIUM: Magnesium: 2.1 mg/dL (ref 1.7–2.4)

## 2022-05-14 MED ORDER — FENTANYL CITRATE (PF) 100 MCG/2ML IJ SOLN
INTRAMUSCULAR | Status: AC
  Filled 2022-05-14: qty 4

## 2022-05-14 MED ORDER — LIDOCAINE HCL (PF) 1 % IJ SOLN
INTRAMUSCULAR | Status: AC | PRN
  Administered 2022-05-14: 10 mL

## 2022-05-14 MED ORDER — K PHOS MONO-SOD PHOS DI & MONO 155-852-130 MG PO TABS
250.0000 mg | ORAL_TABLET | Freq: Three times a day (TID) | ORAL | Status: AC
  Administered 2022-05-14 – 2022-05-16 (×6): 250 mg via ORAL
  Filled 2022-05-14 (×6): qty 1

## 2022-05-14 MED ORDER — FENTANYL CITRATE (PF) 100 MCG/2ML IJ SOLN
INTRAMUSCULAR | Status: AC | PRN
  Administered 2022-05-14 (×2): 50 ug via INTRAVENOUS

## 2022-05-14 MED ORDER — MIDAZOLAM HCL 2 MG/2ML IJ SOLN
INTRAMUSCULAR | Status: AC
  Filled 2022-05-14: qty 4

## 2022-05-14 MED ORDER — SODIUM CHLORIDE 0.9% FLUSH
5.0000 mL | Freq: Three times a day (TID) | INTRAVENOUS | Status: DC
  Administered 2022-05-14 – 2022-05-22 (×21): 5 mL

## 2022-05-14 MED ORDER — IOHEXOL 300 MG/ML  SOLN
50.0000 mL | Freq: Once | INTRAMUSCULAR | Status: AC | PRN
  Administered 2022-05-14: 10 mL

## 2022-05-14 MED ORDER — LIDOCAINE HCL 1 % IJ SOLN
INTRAMUSCULAR | Status: AC
  Filled 2022-05-14: qty 20

## 2022-05-14 MED ORDER — MIDAZOLAM HCL 2 MG/2ML IJ SOLN
INTRAMUSCULAR | Status: AC | PRN
  Administered 2022-05-14 (×2): 1 mg via INTRAVENOUS

## 2022-05-14 NOTE — Progress Notes (Signed)
PROGRESS NOTE    Gregory [name]  NGE:[id number] DOB: 17-Jan-1961 DOA: 05/08/2022 PCP: Patient, [name]   Brief Narrative:  61 years old male with PMH significant for CAD s/p stenting, chronic systolic heart failure, recurrent VT with ICD placement, atrial fibrillation, diabetes mellitus type 2, history of seizures, hypertension presented in the ED after his AICD gave him a shock.  He felt his heart rate was elevated and then he felt like a shock so he called 911.  He does have history of recurrent VT.  Patient had recent abdominal surgery , states abdominal pain has not improved.  Patient denies any discharge from his incision. He was recently hospitalized from 4/28 - 5/12 for symptomatic anemia. 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  Metastatic involving lymph nodes.  Patient underwent hand-assisted laparoscopic right colectomy on 04/18/2022 so far has received 7 units of PRBCs.  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  IR is consulted.  Patient underwent percutaneous drain and embolization of splenic artery for pseudoaneurysm.  Patient tolerated procedure well.  Patient started on IV antibiotics.  Patient continued to have elevated white cell count despite being on antibiotics. Repeat CT scan shows decreased size of abscess with moderately large collection of gas and fluid remaining.  IR is consulted, scheduled to have repositioning/exchange of existing percutaneous drain.  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  Active Problems:   Ileus following gastrointestinal surgery (HCC)   Hypothyroidism   Leukocytosis   Cancer of  right colon (HCC)   Chronic combined systolic and diastolic CHF (congestive heart failure) (HCC)/ischemic cardiomyopathy   CAD S/P percutaneous coronary angioplasty   Paroxysmal atrial fibrillation (HCC)   Controlled type 2 diabetes mellitus without complication, without long-term current use of insulin (HCC)   Essential hypertension   Seizures (HCC)   OSA on CPAP  Recurrent V.Tach /  s/p AICD shock: Patient with history of recurrent VT reports AICD firing, admitted after shock from device.. Patient denies any chest pain.  EKG showed normal sinus rhythm. EP physician consulted.  Patient is started on amiodarone gtt. TSH abnormal. Dose adjusted. Cardiologist states patient will continue to have episodes of V. tach until primary infection resolves. Continued on amiodarone infusion.  VT episodes reduced in frequency. Plan is to switch to PO amiodarone once acute infection controlled.   Ileus status post gastric surgery: Intra-abdominal abscess. S/p hand-assisted laparoscopic right colectomy on 04/18/22. Had post operative ileus during last hospital stay. Now presenting with abdominal pain, last BM 2 days ago and xray finding of ileus General surgery has been consulted. 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 Held eliquis/plavix until IR intervention. Adequate pain control with oxycodone and morphine as needed for breakthrough pain. Patient underwent successful percutaneous drain, and embolization of the splenic artery. Initiated on Zosyn and then transitioned to Unasyn. Continue to have high white cell count despite being on antibiotic. Repeat imaging shows persistent but decreasing size of abscess. IR recommended repositioning/exchange of existing percutaneous drain.  Leukocytosis: Likely secondary to intra-abdominal abscess. Lactic acid 1.7. WBC trending down but still elevated. Continued on IV Zosyn.  Cultures growing E. coli and strep anginosus  sensitive to Unasyn. Antibiotic changed with IV Unasyn.  Continue  to monitor WBC.   Hypothyroidism:  Could be sick thyroid or due to amiodarone. Free T4 wnl Would repeat levels in 1-2 weeks.   Cancer of right colon [name]) Recently diagnosed at previous hospitalization. 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  He has follow up with oncology where PET scan is planned and discuss treatment options.   Chronic combined systolic and diastolic CHF: Echo 7/65/4650: LVEF 35-40%, worse compared to 09/22/2020 (LVEF 40-45%).  Clinically euvolemic.  Continue farxiga Metoprolol XL and Entresto is on hold with soft bp/hypotension.   Paroxysmal atrial fibrillation (HCC) Patient remains in normal sinus rhythm now. Continue amiodarone IV for now,  change to p.o. once acute infection improves or controlled. Eliquis resumed but discontinued again, resume once IR prodeure completed.  CAD S/P percutaneous coronary angioplasty S/p LHC on 04/17/22 recommendations:  Anatomy is similar to prior with the following exceptions: 50% to 70% stenosis proximal to the large first obtuse marginal.  40 to 50% stenosis proximal to the stent segment in the first diagonal. He denies any chest pain, troponin mildly elevated. Continue medical management.  Resume Plavix tomorrow.   Type 2 diabetes without complication. Hb A1c 5.1.  Well-controlled. Continue farxiga for CHF SSI and accuchecks per protocol.    Essential hypertension Soft bp, systolic <354.   Hold entrestro, toprol-xl and lasix    Seizures (HCC) Continue keppra. Seizure precautions    OSA on CPAP Uses cpap at night  Continue qhs.  Lt. leg swelling: Venous duplex negative for DVT.   DVT prophylaxis: TED hose Code Status: Full code. Family Communication:No family at bed  side. Disposition Plan:    Status is: Inpatient Remains inpatient appropriate because: Admitted for AICD shock found to have intra-abdominal abscess and pseudoaneurysm of splenic artery .  Patient underwent percutaneous drain and embolization of the splenic artery.  Repeat CT scan shows decreasing but persistent abdominal abscess.  IR recommended repositioning/exchange of existing percutaneous drain.  Anticipated discharge home in 1 to 2 days.  Consultants:  General surgery, IR,  Procedures: CT abdomen pelvis Antimicrobials:  Anti-infectives (From admission, onward)    Start     Dose/Rate Route Frequency Ordered Stop   05/13/22 1800  Ampicillin-Sulbactam (UNASYN) 3 g in sodium chloride 0.9 % 100 mL IVPB        3 g 200 mL/hr over 30 Minutes Intravenous Every 6 hours 05/13/22 1136     05/08/22 2200  piperacillin-tazobactam (ZOSYN) IVPB 3.375 g  Status:  Discontinued        3.375 g 12.5 mL/hr over 240 Minutes Intravenous Every 8 hours 05/08/22 2008 05/13/22 1136       Subjective: Patient was seen and examined at bedside.  Overnight events noted.  He reports abdominal pain has improved,  still has lower abdominal pain Patient denies any nausea and vomiting.  He had small bowel movement yesterday. He wants to try tapwater enema today.   Objective: Vitals:   05/14/22 1201 05/14/22 1205 05/14/22 1210 05/14/22 1215  BP: 120/81 117/80 117/78 126/76  Pulse: 73 72 71 75  Resp: '12 12 11 11  '$ Temp:      TempSrc:      SpO2: 92% 97% 96% 96%  Weight:      Height:        Intake/Output Summary (Last 24 hours) at 05/14/2022 1220 Last data filed at 05/14/2022 0551 Gross per  24 hour  Intake 105 ml  Output 1245 ml  Net -1140 ml   Filed Weights   05/08/22 0841 05/08/22 1626  Weight: 95.7 kg 102.1 kg    Examination:  General exam: Appears comfortable,  deconditioned, not in any distress. Respiratory system: CTA bilaterally, respiratory effort normal, RR 16 Cardiovascular system:  S1-S2 heard, regular rate and rhythm, no murmur. Gastrointestinal system: Abdomen is soft, distended, mildly tender, normal bowel sounds, PCT drain noted Central nervous system: Alert and oriented X 3 . No focal neurological deficits. Extremities: Edema+, no cyanosis, no clubbing. Left leg is still swollen but improving Skin: No rashes, lesions or ulcers Psychiatry: Judgement and insight appear normal. Mood & affect appropriate.     Data Reviewed: I have personally reviewed following labs and imaging studies  CBC: Recent Labs  Lab 05/08/22 0918 05/09/22 0230 05/10/22 0315 05/11/22 0251 05/12/22 0116 05/13/22 0705 05/14/22 0206  WBC 13.2*   < > 28.8* 28.6* 22.1* 17.7* 22.5*  NEUTROABS 11.8*  --   --   --   --   --   --   HGB 14.3   < > 12.1* 10.7* 10.4* 10.3* 9.8*  HCT 48.5   < > 39.8 34.5* 34.8* 35.2* 32.4*  MCV 86.1   < > 82.4 82.3 82.7 82.8 82.9  PLT 473*   < > 407* 337 349 334 323   < > = values in this interval not displayed.   Basic Metabolic Panel: Recent Labs  Lab 05/09/22 0615 05/10/22 0315 05/10/22 0636 05/11/22 0251 05/12/22 0116 05/13/22 0705 05/14/22 0206  NA 130* 128*  --  129* 131* 130* 130*  K 4.8 5.2* 5.1 4.7 4.4 4.1 4.4  CL 100 98  --  99 98 100 99  CO2 22 19*  --  '22 26 27 26  '$ GLUCOSE 95 119*  --  127* 108* 131* 109*  BUN 25* 22  --  15 9 7* 5*  CREATININE 0.96 0.86  --  0.73 0.71 0.84 0.74  CALCIUM 7.9* 7.8*  --  7.7* 7.8* 7.6* 7.7*  MG 2.2 2.1  --  2.1  --  2.1 2.1  PHOS  --  4.3  --  2.7  --   --  2.3*   GFR: Estimated Creatinine Clearance: 122.2 mL/min (by C-G formula based on SCr of 0.74 mg/dL). Liver Function Tests: Recent Labs  Lab 05/10/22 0315 05/11/22 0251 05/12/22 0116 05/13/22 0705 05/14/22 0206  AST 56* 70* 80* 70* 69*  ALT 43 44 52* 56* 55*  ALKPHOS 114 156* 174* 174* 164*  BILITOT 0.7 0.5 0.3 0.1* 0.2*  PROT 5.7* 5.2* 5.4* 5.5* 5.6*  ALBUMIN 1.6* <1.5* <1.5* <1.5* <1.5*   No results for input(s): LIPASE, AMYLASE in  the last 168 hours. No results for input(s): AMMONIA in the last 168 hours. Coagulation Profile: Recent Labs  Lab 05/08/22 1637 05/09/22 1325  INR 1.7* 1.6*   Cardiac Enzymes: No results for input(s): CKTOTAL, CKMB, CKMBINDEX, TROPONINI in the last 168 hours. BNP (last 3 results) No results for input(s): PROBNP in the last 8760 hours. HbA1C: No results for input(s): HGBA1C in the last 72 hours.  CBG: Recent Labs  Lab 05/13/22 0743 05/13/22 1205 05/13/22 1625 05/13/22 2230 05/14/22 0800  GLUCAP 122* 82 138* 113* 119*   Lipid Profile: No results for input(s): CHOL, HDL, LDLCALC, TRIG, CHOLHDL, LDLDIRECT in the last 72 hours. Thyroid Function Tests: No results for input(s): TSH, T4TOTAL, FREET4, T3FREE, THYROIDAB in the last  72 hours.  Anemia Panel: No results for input(s): VITAMINB12, FOLATE, FERRITIN, TIBC, IRON, RETICCTPCT in the last 72 hours. Sepsis Labs: Recent Labs  Lab 05/08/22 1637 05/08/22 2017  LATICACIDVEN 1.9 1.3    Recent Results (from the past 240 hour(s))  Aerobic/Anaerobic Culture w Gram Stain (surgical/deep wound)     Status: None   Collection Time: 05/09/22  5:38 PM   Specimen: Abscess  Result Value Ref Range Status   Specimen Description ABSCESS  Final   Special Requests NONE  Final   Gram Stain   Final    ABUNDANT WBC PRESENT,BOTH PMN AND MONONUCLEAR FEW GRAM POSITIVE COCCI IN CLUSTERS FEW GRAM POSITIVE COCCI IN CHAINS RARE GRAM NEGATIVE RODS    Culture   Final    ABUNDANT STREPTOCOCCUS ANGINOSIS MODERATE ESCHERICHIA COLI ABUNDANT BACTEROIDES FRAGILIS BETA LACTAMASE POSITIVE Performed at [facility], [place] [address], [place], [place] [address]    Report Status 05/13/2022 FINAL  Final   Organism ID, Bacteria ESCHERICHIA COLI  Final   Organism ID, Bacteria STREPTOCOCCUS ANGINOSIS  Final      Susceptibility   Escherichia coli - MIC*    AMPICILLIN 8 SENSITIVE Sensitive     CEFAZOLIN <=4 SENSITIVE Sensitive     CEFEPIME <=0.12  SENSITIVE Sensitive     CEFTAZIDIME <=1 SENSITIVE Sensitive     CEFTRIAXONE <=0.25 SENSITIVE Sensitive     CIPROFLOXACIN <=0.25 SENSITIVE Sensitive     GENTAMICIN <=1 SENSITIVE Sensitive     IMIPENEM <=0.25 SENSITIVE Sensitive     TRIMETH/SULFA <=20 SENSITIVE Sensitive     AMPICILLIN/SULBACTAM <=2 SENSITIVE Sensitive     PIP/TAZO <=4 SENSITIVE Sensitive     * MODERATE ESCHERICHIA COLI   Streptococcus anginosis - MIC*    PENICILLIN <=0.06 SENSITIVE Sensitive     CEFTRIAXONE <=0.12 SENSITIVE Sensitive     ERYTHROMYCIN 4 RESISTANT Resistant     LEVOFLOXACIN 0.5 SENSITIVE Sensitive     VANCOMYCIN 0.5 SENSITIVE Sensitive     * ABUNDANT STREPTOCOCCUS ANGINOSIS     Radiology Studies: DG Abd 2 Views  Result Date: 05/13/2022 CLINICAL DATA:  The retroperitoneal hemorrhage, left-sided abdominal pain, constipation EXAM: ABDOMEN - 2 VIEW COMPARISON:  CT 05/12/2022 FINDINGS: No free air on erect radiographs. AICD leads are partially visualized. Atelectasis/consolidation at the left lung base. Stomach appears decompressed. Multiple gas filled small bowel loops throughout the abdomen. There is mild gaseous distention of the transverse colon, remainder of colon is nondilated. Stable pigtail catheter in the right lateral abdomen. Multiple splenic artery embolization coils noted. IMPRESSION: Nonobstructive bowel gas pattern. No free air. Electronically Signed   By: Lucrezia Europe M.D.   On: 05/13/2022 08:28   VAS [place] LOWER EXTREMITY VENOUS (DVT)  Result Date: 05/13/2022  Lower Venous DVT Study Patient Name:  Gregory [name]  Date of Exam:   05/12/2022 Medical Rec #: [id number]                  Accession #:    [id number] Date of Birth: 10/27/1961                  Patient Gender: M Patient Age:   74 years Exam Location:  [facility] Procedure:      VAS [place] LOWER EXTREMITY VENOUS (DVT) Referring Phys: Shawna Clamp --------------------------------------------------------------------------------   Indications: Asymmetrical swelling- left leg.  Risk Factors: Cancer - right colon. Comparison Study: No prior lower extremity studies. Performing Technologist: Darlin Coco RDMS, RVT  Examination Guidelines: A complete  evaluation includes B-mode imaging, spectral Doppler, color Doppler, and power Doppler as needed of all accessible portions of each vessel. Bilateral testing is considered an integral part of a complete examination. Limited examinations for reoccurring indications may be performed as noted. The reflux portion of the exam is performed with the patient in reverse Trendelenburg.  +-----+---------------+---------+-----------+----------+--------------+ RIGHTCompressibilityPhasicitySpontaneityPropertiesThrombus Aging +-----+---------------+---------+-----------+----------+--------------+ CFV  Full           Yes      Yes                                 +-----+---------------+---------+-----------+----------+--------------+   +---------+---------------+---------+-----------+----------+--------------+ LEFT     CompressibilityPhasicitySpontaneityPropertiesThrombus Aging +---------+---------------+---------+-----------+----------+--------------+ CFV      Full           Yes      Yes                                 +---------+---------------+---------+-----------+----------+--------------+ SFJ      Full                                                        +---------+---------------+---------+-----------+----------+--------------+ FV Prox  Full                                                        +---------+---------------+---------+-----------+----------+--------------+ FV Mid   Full                                                        +---------+---------------+---------+-----------+----------+--------------+ FV DistalFull                                                        +---------+---------------+---------+-----------+----------+--------------+  PFV      Full                                                        +---------+---------------+---------+-----------+----------+--------------+ POP      Full           Yes      Yes                                 +---------+---------------+---------+-----------+----------+--------------+ PTV      Full                                                        +---------+---------------+---------+-----------+----------+--------------+  PERO     Full                                                        +---------+---------------+---------+-----------+----------+--------------+ Gastroc  Full                                                        +---------+---------------+---------+-----------+----------+--------------+     Summary: RIGHT: - No evidence of common femoral vein obstruction.  LEFT: - There is no evidence of deep vein thrombosis in the lower extremity.  - No cystic structure found in the popliteal fossa.  *See table(s) above for measurements and observations. Electronically signed by Deitra Mayo MD on 05/13/2022 at 10:52:47 AM.    Final     Scheduled Meds:  atorvastatin  80 mg Oral QPM   dapagliflozin propanediol  10 mg Oral Daily   docusate sodium  100 mg Oral BID   ezetimibe  10 mg Oral Daily   insulin aspart  0-9 Units Subcutaneous TID WC   levETIRAcetam  500 mg Oral BID   lidocaine       pantoprazole  40 mg Oral BID   polyethylene glycol  17 g Oral Daily   sodium chloride flush  3 mL Intravenous Q12H   sodium chloride flush  5 mL Intracatheter Q8H   Continuous Infusions:  sodium chloride     amiodarone 30 mg/hr (05/14/22 0551)   ampicillin-sulbactam (UNASYN) IV 3 g (05/14/22 0509)     LOS: 6 days    Time spent: 35 mins    Emeri Estill, MD [facility]   If 7PM-7AM, please contact night-coverage

## 2022-05-14 NOTE — Procedures (Signed)
Interventional Radiology Procedure Note  Procedure:  Fluoroscopic guided drain exchange  Findings: Please refer to procedural dictation for full description. 14 Fr MDP drain exchanged for 14 Fr biliary-type drain with multiple side holes, repositioned to mid abdomen from RUQ.  Approximately 100 mL of opaque yellow-green fluid aspirated immediately.  Placed to bulb suction  Complications: None immediate  Estimated Blood Loss: None  Recommendations: Keep to bulb suction. Recommend aggressive flushing (30 mL, Q6H). IR will continue to follow.   [name], MD Pager: [phone]

## 2022-05-14 NOTE — Progress Notes (Signed)
Physical Therapy Treatment Patient Details Name: Gregory [name] MRN: [id number] DOB: 08-30-1961 Today's Date: 05/14/2022   History of Present Illness The pt is a 61 yo male presenting 5/25 after AICD firing x3 and pt experiencing SOB. 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 PMH includes: CAD, VT, OSA on CPAP, seizures, CHF, HLD, HTN, obesity, afib, PVCs, and metastatic colon cancer s/p colon resection 5/5.    PT Comments    Patient making slow progress and limited by abdominal pain and dizziness. Pt sitting EOB  with OT at start of session and agreeable to attempt transfers and mobility. Pt unable to advance to gait today due to pain and returned to sitting EOB after 1x sit<>stand. Seated LE strengthening completed and reviewed log roll technique to return to supine at EOS. IF pt is unable to progress mobility and independence with ambulation he would benefit from ST rehab at [facility] setting. Will progress as able and update recommendations if needed.    Recommendations for follow up therapy are one component of a multi-disciplinary discharge planning process, led by the attending physician.  Recommendations may be updated based on patient status, additional functional criteria and insurance authorization.  Follow Up Recommendations  Home health PT (pt will benefit from [place] rehab at SNF if mobility does not progress)     Assistance Recommended at Discharge Intermittent Supervision/Assistance  Patient can return home with the following A little help with walking and/or transfers;A little help with bathing/dressing/bathroom;Assistance with cooking/housework;Assist for transportation;Help with stairs or ramp for entrance   Equipment Recommendations  Rolling walker (2  wheels);BSC/3in1    Recommendations for Other Services       Precautions / Restrictions Precautions Precautions: Fall Precaution Comments: watch O2, sx lightheadedness, abdominal drain, watch HR (hx of PVCs and runs of vtach) Restrictions Weight Bearing Restrictions: No     Mobility  Bed Mobility Overal bed mobility: Needs Assistance Bed Mobility: Rolling, Sit to Sidelying Rolling: Supervision       Sit to sidelying: Min assist General bed mobility comments: Min assist to raise LE's onto EOB to return to supine via log roll technique.    Transfers Overall transfer level: Needs assistance Equipment used: Rolling walker (2 wheels) Transfers: Sit to/from Stand Sit to Stand: From elevated surface, Min guard           General transfer comment: EOB elevated to accomodate pt height. Pt required min guard for safety and using bil UE to power up from EOB. limited by pain in abdomen and unable to progress.    Ambulation/Gait               General Gait Details: deferred due to abd pain and dizziness   Stairs             Wheelchair Mobility    Modified Rankin (Stroke Patients Only)       Balance Overall balance assessment: Needs assistance Sitting-balance support: No upper extremity supported, Feet supported Sitting balance-Leahy Scale: Good     Standing balance support: Bilateral upper extremity supported, During functional activity Standing balance-Leahy Scale: Poor Standing balance comment: dependent on BUE support to maintain static or dynamic stance  Cognition Arousal/Alertness: Awake/alert Behavior During Therapy: WFL for tasks assessed/performed Overall Cognitive Status: Within Functional Limits for tasks assessed                                 General Comments: pleasant, participatory, some anxiety over pain anticipation        Exercises General Exercises - Lower Extremity Long Arc  Quad: AROM, Both, 10 reps Hip ABduction/ADduction: AROM, Both, 10 reps (add, pillow squeeze) Toe Raises: AROM, Both, 15 reps Heel Raises: AROM, Both, 15 reps    General Comments General comments (skin integrity, edema, etc.): BP 140s/90s sitting EOB      Pertinent Vitals/Pain Pain Assessment Pain Assessment: Faces Faces Pain Scale: Hurts little more Pain Location: abdomen Pain Descriptors / Indicators: Grimacing, Guarding, Sharp Pain Intervention(s): Limited activity within patient's tolerance, Monitored during session, Repositioned    Home Living Family/patient expects to be discharged to:: Private residence Living Arrangements: Children Available Help at Discharge: Family;Available 24 hours/day Type of Home: House Home Access: Level entry     Alternate Level Stairs-Number of Steps: 14 Home Layout: Two level;Bed/bath upstairs;1/2 bath on main level Home Equipment: Shower seat;Hand held shower head      Prior Function            PT Goals (current goals can now be found in the care plan section) Acute Rehab PT Goals Patient Stated Goal: Less pain, regain independence PT Goal Formulation: With patient Time For Goal Achievement: 05/25/22 Potential to Achieve Goals: Good Progress towards PT goals: Progressing toward goals (slow)    Frequency    Min 3X/week      PT Plan Current plan remains appropriate    Co-evaluation              AM-PAC PT "6 Clicks" Mobility   Outcome Measure  Help needed turning from your back to your side while in a flat bed without using bedrails?: A Little Help needed moving from lying on your back to sitting on the side of a flat bed without using bedrails?: A Little Help needed moving to and from a bed to a chair (including a wheelchair)?: A Lot Help needed standing up from a chair using your arms (e.g., wheelchair or bedside chair)?: A Lot Help needed to walk in hospital room?: A Lot Help needed climbing 3-5 steps with a  railing? : Total 6 Click Score: 13    End of Session Equipment Utilized During Treatment: Oxygen Activity Tolerance: Patient limited by pain Patient left: in bed;with call bell/phone within reach Nurse Communication: Mobility status;Patient requests pain meds PT Visit Diagnosis: Other abnormalities of gait and mobility (R26.89);Muscle weakness (generalized) (M62.81)     Time: 6269-4854 PT Time Calculation (min) (ACUTE ONLY): 22 min  Charges:  $Therapeutic Activity: 8-22 mins                     Verner Mould, DPT Acute Rehabilitation Services Office [phone] Pager [phone]  05/14/22 11:18 AM

## 2022-05-14 NOTE — Progress Notes (Signed)
Patient ID: Gregory [name], male   DOB: 10-Feb-1961, 61 y.o.   MRN: [id number] [facility] Surgery Progress Note     Subjective: CC-  Feeling a little better than yesterday. Abdomen still sore, mostly on the left. He had a small BM yesterday and is passing some flatus. Less bloating. Denies n/v. WBC up 22.5, afebrile.  Objective: Vital signs in last 24 hours: Temp:  [97.5 F (36.4 C)-97.8 F (36.6 C)] 97.5 F (36.4 C) (05/31 0711) Pulse Rate:  [73-80] 75 (05/31 0711) Resp:  [16-18] 18 (05/31 0711) BP: (114-142)/(81-89) 130/89 (05/31 0711) SpO2:  [97 %-99 %] 99 % (05/31 0711) Last BM Date : 05/06/22  Intake/Output from previous day: 05/30 0701 - 05/31 0700 In: 105 [IV Piggyback:100] Out: 2637 [Urine:1000; Drains:245] Intake/Output this shift: No intake/output data recorded.  PE: Gen:  Alert, NAD, pleasant Pulm:  rate and effort normal on room air Abd: mild distension but soft, incision cdi, drain with purulent fluid in bag, mild erythema noted to left lateral aspect of abdomen improved from yesterday  Lab Results:  Recent Labs    05/13/22 0705 05/14/22 0206  WBC 17.7* 22.5*  HGB 10.3* 9.8*  HCT 35.2* 32.4*  PLT 334 323   BMET Recent Labs    05/13/22 0705 05/14/22 0206  NA 130* 130*  K 4.1 4.4  CL 100 99  CO2 27 26  GLUCOSE 131* 109*  BUN 7* 5*  CREATININE 0.84 0.74  CALCIUM 7.6* 7.7*   PT/INR No results for input(s): LABPROT, INR in the last 72 hours. CMP     Component Value Date/Time   NA 130 (L) 05/14/2022 0206   NA 137 04/11/2022 1219   K 4.4 05/14/2022 0206   CL 99 05/14/2022 0206   CO2 26 05/14/2022 0206   GLUCOSE 109 (H) 05/14/2022 0206   BUN 5 (L) 05/14/2022 0206   BUN 13 04/11/2022 1219   CREATININE 0.74 05/14/2022 0206   CREATININE 1.08 12/12/2016 1445   CALCIUM 7.7 (L) 05/14/2022 0206   PROT 5.6 (L) 05/14/2022 0206   PROT 6.5 02/28/2022 1304   ALBUMIN <1.5 (L) 05/14/2022 0206   ALBUMIN 4.0 02/28/2022 1304   AST 69 (H)  05/14/2022 0206   ALT 55 (H) 05/14/2022 0206   ALKPHOS 164 (H) 05/14/2022 0206   BILITOT 0.2 (L) 05/14/2022 0206   BILITOT 0.2 02/28/2022 1304   GFRNONAA >60 05/14/2022 0206   GFRAA 91 11/15/2020 0959   Lipase     Component Value Date/Time   LIPASE 120 (H) 09/09/2020 1810       Studies/Results: CT ABDOMEN PELVIS W CONTRAST  Result Date: 05/12/2022 CLINICAL DATA:  Follow-up percutaneously drained abdominal abscess performed on 05/09/2022. 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 EXAM: CT ABDOMEN AND PELVIS WITH CONTRAST TECHNIQUE: Multidetector CT imaging of the abdomen and pelvis was performed using the standard protocol following bolus administration of intravenous contrast. RADIATION DOSE REDUCTION: This exam was performed according to the departmental dose-optimization program which includes automated exposure control, adjustment of the mA and/or kV according to patient size and/or use of iterative reconstruction technique. CONTRAST:  120m OMNIPAQUE IOHEXOL 300 MG/ML  SOLN COMPARISON:  06/03/2022 and 05/08/2022. FINDINGS: Lower chest: Moderate-sized left pleural effusion with a mild increase in size. Stable small right pleural effusion. Increased left lower lobe atelectasis. Stable right lower lobe atelectasis. The heart remains  enlarged, especially the left atrium. Right ventricular AICD leads are again demonstrated. Atheromatous calcifications, including the coronary arteries and aorta. Hepatobiliary: Multiple small liver cysts. Sludge and 4 mm gallstone in the dependent portion of the gallbladder. No gallbladder wall thickening. Minimal pericholecystic fluid. Pancreas: No significant change in the previously demonstrated bilobed fluid collection in the region of the pancreatic tail with an oval area of persistent higher density laterally.  Interval embolization coil artifacts at that location. Spleen: Anterior, superior splenic infarct. The remainder of the spleen is unremarkable. Adrenals/Urinary Tract: Stable mild bilateral adrenal hyperplasia. Bilateral renal cysts. Poorly distended bladder with mild to moderate diffuse wall thickening. Unremarkable ureters. Mild urinary tract calculi or hydronephrosis. Stomach/Bowel: Multiple mildly to moderately dilated loops of small bowel and transverse colon. Small number of sigmoid colon diverticula. Unremarkable stomach. 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 No enlarged lymph nodes. Reproductive: Prostate is unremarkable. Other: Interval right lateral percutaneous drainage catheter within the lateral aspect of the previously demonstrated 25.0 x 15.5 cm fluid and gas collection in the right mid abdomen and extending superiorly lateral to the liver. That collection is significantly smaller, measuring 14.9 x 7.7 cm at the same level on image number 60/3. In the upper pelvis, this measures 19.4 x 6.9 cm on image number 73/3, previously 28.2 x 14.8 cm. A left lateral fluid collection is more oval in configuration with a mild increase in size. This communicates with the pelvic portion of the larger, drained collection, currently with a thin surrounding enhancing rim. This collection measures 15.9 x 5.9 cm on image number 49/3. On 05/09/2022, this measured 10.9 x 4.5 cm in corresponding dimensions. Small amount of free peritoneal fluid.  Diffuse subcutaneous edema. Musculoskeletal: Lumbar and lower thoracic spine degenerative changes. IMPRESSION: 1. Interval significant decrease in size of the large abdominal and pelvic abscess with a moderately large collection of gas and fluid remaining. 2. Small amount of ascites. 3. Cholelithiasis and mild sludge in the gallbladder without evidence of  cholecystitis. 4. Moderate-sized left pleural effusion, mildly increased. 5. Small right pleural effusion, unchanged. 6. Bilateral lower lobe atelectasis, increased on the left. 7. Diffuse subcutaneous edema. Electronically Signed   By: Claudie Revering M.D.   On: 05/12/2022 12:24   DG Abd 2 Views  Result Date: 05/13/2022 CLINICAL DATA:  The retroperitoneal hemorrhage, left-sided abdominal pain, constipation EXAM: ABDOMEN - 2 VIEW COMPARISON:  CT 05/12/2022 FINDINGS: No free air on erect radiographs. AICD leads are partially visualized. Atelectasis/consolidation at the left lung base. Stomach appears decompressed. Multiple gas filled small bowel loops throughout the abdomen. There is mild gaseous distention of the transverse colon, remainder of colon is nondilated. Stable pigtail catheter in the right lateral abdomen. Multiple splenic artery embolization coils noted. IMPRESSION: Nonobstructive bowel gas pattern. No free air. Electronically Signed   By: Lucrezia Europe M.D.   On: 05/13/2022 08:28   VAS [place] LOWER EXTREMITY VENOUS (DVT)  Result Date: 05/13/2022  Lower Venous DVT Study Patient Name:  ALTARIQ GOODALL  Date of Exam:   05/12/2022 Medical Rec #: [id number]                  Accession #:    [id number] Date of Birth: 14-Jan-1961                  Patient Gender: M Patient Age:   76 years Exam [facility] Procedure:  VAS [place] LOWER EXTREMITY VENOUS (DVT) Referring Phys: Shawna Clamp --------------------------------------------------------------------------------  Indications: Asymmetrical swelling- left leg.  Risk Factors: Cancer - right colon. Comparison Study: No prior lower extremity studies. Performing Technologist: Darlin Coco RDMS, RVT  Examination Guidelines: A complete evaluation includes B-mode imaging, spectral Doppler, color Doppler, and power Doppler as needed of all accessible portions of each vessel. Bilateral testing is considered an integral part of a complete  examination. Limited examinations for reoccurring indications may be performed as noted. The reflux portion of the exam is performed with the patient in reverse Trendelenburg.  +-----+---------------+---------+-----------+----------+--------------+ RIGHTCompressibilityPhasicitySpontaneityPropertiesThrombus Aging +-----+---------------+---------+-----------+----------+--------------+ CFV  Full           Yes      Yes                                 +-----+---------------+---------+-----------+----------+--------------+   +---------+---------------+---------+-----------+----------+--------------+ LEFT     CompressibilityPhasicitySpontaneityPropertiesThrombus Aging +---------+---------------+---------+-----------+----------+--------------+ CFV      Full           Yes      Yes                                 +---------+---------------+---------+-----------+----------+--------------+ SFJ      Full                                                        +---------+---------------+---------+-----------+----------+--------------+ FV Prox  Full                                                        +---------+---------------+---------+-----------+----------+--------------+ FV Mid   Full                                                        +---------+---------------+---------+-----------+----------+--------------+ FV DistalFull                                                        +---------+---------------+---------+-----------+----------+--------------+ PFV      Full                                                        +---------+---------------+---------+-----------+----------+--------------+ POP      Full           Yes      Yes                                 +---------+---------------+---------+-----------+----------+--------------+ PTV      Full                                                         +---------+---------------+---------+-----------+----------+--------------+  PERO     Full                                                        +---------+---------------+---------+-----------+----------+--------------+ Gastroc  Full                                                        +---------+---------------+---------+-----------+----------+--------------+     Summary: RIGHT: - No evidence of common femoral vein obstruction.  LEFT: - There is no evidence of deep vein thrombosis in the lower extremity.  - No cystic structure found in the popliteal fossa.  *See table(s) above for measurements and observations. Electronically signed by Deitra Mayo MD on 05/13/2022 at 10:52:47 AM.    Final     Anti-infectives: Anti-infectives (From admission, onward)    Start     Dose/Rate Route Frequency Ordered Stop   05/13/22 1800  Ampicillin-Sulbactam (UNASYN) 3 g in sodium chloride 0.9 % 100 mL IVPB        3 g 200 mL/hr over 30 Minutes Intravenous Every 6 hours 05/13/22 1136     05/08/22 2200  piperacillin-tazobactam (ZOSYN) IVPB 3.375 g  Status:  Discontinued        3.375 g 12.5 mL/hr over 240 Minutes Intravenous Every 8 hours 05/08/22 2008 05/13/22 1136        Assessment/Plan POD#26 s/p lap assisted R colectomy for adenocarcinoma, Dr. 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 Has seen Dr. Burr Medico - CT 5/25 with large post-op abscess - IR drainage 5/26 with 2.7L out initially, 250cc documented for the last 24h, Culture growing E COLI and STREP ANGINOSIS >> narrowed abx to unasyn 5/30 - also concern for splenic artery PSA on CT, CTA 5/26 with concern for dissection at bifurcation of celiac as well as some extravasation from distal branch of splenic artery, he has had this embolized 5/26. H/h  stable -continue abx and drain -repeat CT 5/29 with interval significant decrease in size of the large abdominal and pelvic abscess with a moderately large collection of gas and fluid remaining >> IR planning to reposition and exchange existing drain today   FEN - NPO for possible procedure VTE - continue to hold plavix and eliquis, ok for heparin gtt after procedure if needed ID - Zosyn 5/25>>5/30, unasyn 5/30>>   - Per TRH/Cards -  CAD with history of PCI to LAD Recurrent ventricular tachycardia status post AICD placement, three episodes last PM as long as 4m Cardiology was notified, expected given underlying illness and tolerating episodes.  Ischemic cardiomyopathy CHF, systolic and diastolic, LV EF 35 to 432%Atrial fibrillation     LOS: 6 days    BWellington Hampshire PA[facility] 05/14/2022, 8:28 AM Please see Amion for pager number during day hours 7:00am-4:30pm

## 2022-05-14 NOTE — Progress Notes (Signed)
Note plans for possible IR procedure today.   Pt rhythm mostly quiescent overnight with occasional PVCs, rare couplets/triplets.   He did have a short run of sustained VT this am ~130 bpm. Self resolved.   Would continue IV amiodarone until procedures are complete.   Keep K > 4.0 and Mg > 2.0   EP will continue to follow along.   Legrand Como [address] [place], PA-C  05/14/2022 8:10 AM

## 2022-05-14 NOTE — Evaluation (Signed)
Occupational Therapy Evaluation Patient Details Name: Gregory [name] MRN: [id number] DOB: 04-28-1961 Today's Date: 05/14/2022   History of Present Illness The pt is a 61 yo male presenting 5/25 after AICD firing x3 and pt experiencing SOB. 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 PMH includes: CAD, VT, OSA on CPAP, seizures, CHF, HLD, HTN, obesity, afib, PVCs, and metastatic colon cancer s/p colon resection 5/5.   Clinical Impression   PTA, pt lives with son in two-level home, typically Independent with all ADLs and mobility without AD. Pt presents now with deficits in pain, strength, and endurance. Pt able to complete bed mobility with Min A, endorses lightheadedness sitting EOB though BP WFL. Educated re: energy conservation strategies (use of his shower chair at home, etc), compensatory strategies for LB ADLs and elevation of L UE to decrease swelling. Pt requires Setup for UB ADL and Min A for LB ADLs. Pt declines need for HHOT at [place], endorses son can provide assist as needed at DC for daily tasks. Will continue to follow acutely to progress ADL/mobility endurance and independence.      Recommendations for follow up therapy are one component of a multi-disciplinary discharge planning process, led by the attending physician.  Recommendations may be updated based on patient status, additional functional criteria and insurance authorization.   Follow Up Recommendations  No OT follow up    Assistance Recommended at Discharge Intermittent Supervision/Assistance  Patient can return home with the following A little help with bathing/dressing/bathroom;Help with stairs or ramp for entrance;Assistance with cooking/housework;Assist for transportation    Functional Status  Assessment  Patient has had a recent decline in their functional status and demonstrates the ability to make significant improvements in function in a reasonable and predictable amount of time.  Equipment Recommendations  Other (comment);BSC/3in1 (Rolling walker)    Recommendations for Other Services       Precautions / Restrictions Precautions Precautions: Fall Precaution Comments: watch O2, sx lightheadedness, abdominal drain, watch HR (hx of PVCs and runs of vtach) Restrictions Weight Bearing Restrictions: No      Mobility Bed Mobility Overal bed mobility: Needs Assistance Bed Mobility: Rolling, Sidelying to Sit Rolling: Supervision Sidelying to sit: Min assist, HOB elevated       General bed mobility comments: Min A to lift trunk with handheld assist. encouraged log roll to minimize pain    Transfers                          Balance Overall balance assessment: Needs assistance Sitting-balance support: No upper extremity supported, Feet supported Sitting balance-Leahy Scale: Good                                     ADL either performed or assessed with clinical judgement   ADL Overall ADL's : Needs assistance/impaired Eating/Feeding: Independent   Grooming: Set up;Sitting   Upper Body Bathing: Set up;Sitting   Lower Body Bathing: Minimal assistance;Sit to/from stand   Upper Body Dressing : Set up;Sitting   Lower Body Dressing: Minimal assistance;Sit to/from stand Lower Body Dressing Details (indicate cue type and reason): able to bring feet to self for sock mgmt with increased effort.  reports typically propping foot up on bedframe at home and bedrail here. discussed easier clothing to manage and techniques to minimize pain     Toileting- Clothing Manipulation and Hygiene: Minimal assistance;Sit to/from stand         General ADL Comments: Pt limited by abdominal pain and anticipation of pain. Also lightheaded with movement though  BP WFL, endorses concerns over his HR/rhythm     Vision Baseline Vision/History: 1 Wears glasses Ability to See in Adequate Light: 0 Adequate Patient Visual Report: No change from baseline Vision Assessment?: No apparent visual deficits     Perception     Praxis      Pertinent Vitals/Pain Pain Assessment Pain Assessment: Faces Faces Pain Scale: Hurts little more Pain Location: abdomen Pain Descriptors / Indicators: Grimacing, Guarding, Sharp Pain Intervention(s): Monitored during session, Premedicated before session     Hand Dominance Right   Extremity/Trunk Assessment Upper Extremity Assessment Upper Extremity Assessment: Overall WFL for tasks assessed (L forearm swollen likely due to IV)   Lower Extremity Assessment Lower Extremity Assessment: Defer to PT evaluation   Cervical / Trunk Assessment Cervical / Trunk Assessment: Other exceptions Cervical / Trunk Exceptions: s/p abdominal surgery on 5/5, R abdominal drain placement 5/26   Communication Communication Communication: No difficulties   Cognition Arousal/Alertness: Awake/alert Behavior During Therapy: WFL for tasks assessed/performed Overall Cognitive Status: Within Functional Limits for tasks assessed                                 General Comments: pleasant, participatory, some anxiety over pain anticipation     General Comments  BP 140s/90s sitting EOB    Exercises     Shoulder Instructions      Home Living Family/patient expects to be discharged to:: Private residence Living Arrangements: Children Available Help at Discharge: Family;Available 24 hours/day Type of Home: House Home Access: Level entry     Home Layout: Two level;Bed/bath upstairs;1/2 bath on main level Alternate Level Stairs-Number of Steps: 14 Alternate Level Stairs-Rails: Right;Left;Can reach both Bathroom Shower/Tub: Teacher, early years/pre: Standard     Home Equipment: Shower seat;Hand held  shower head          Prior Functioning/Environment Prior Level of Function : Independent/Modified Independent             Mobility Comments: pt states independent without DME, prior to issues starting had been going to the gym daily for cardio and strength training. ADLs Comments: pt reports independence, family assists with IADLs        OT Problem List: Decreased strength;Impaired balance (sitting and/or standing);Decreased activity tolerance;Pain      OT Treatment/Interventions: Self-care/ADL training;Therapeutic exercise;DME and/or AE instruction;Energy conservation;Therapeutic activities;Patient/family education;Balance training    OT Goals(Current goals can be found in the care plan section) Acute Rehab OT Goals Patient Stated Goal: progress back to independence OT Goal Formulation: With patient Time For Goal Achievement: 05/28/22 Potential to Achieve Goals: Good  OT Frequency: Min 2X/week    Co-evaluation              AM-PAC OT "6 Clicks" Daily Activity     Outcome Measure Help from another person eating meals?: None Help from another person taking care of personal grooming?: A Little Help from another person toileting, which includes using toliet, bedpan, or urinal?: A Little Help from another person bathing (including washing, rinsing, drying)?: A Little Help from another person to  put on and taking off regular upper body clothing?: A Little Help from another person to put on and taking off regular lower body clothing?: A Little 6 Click Score: 19   End of Session Equipment Utilized During Treatment: Oxygen Nurse Communication: Mobility status  Activity Tolerance: Patient tolerated treatment well Patient left: in bed;Other (comment) (sitting EOB with PT)  OT Visit Diagnosis: Other abnormalities of gait and mobility (R26.89);Muscle weakness (generalized) (M62.81)                Time: 7076-1518 OT Time Calculation (min): 16 min Charges:  OT General  Charges $OT Visit: 1 Visit OT Evaluation $OT Eval Moderate Complexity: 1 Mod  Malachy Chamber, OTR/L Acute Rehab Services Office: [phone]   Layla Maw 05/14/2022, 9:54 AM

## 2022-05-14 NOTE — Progress Notes (Signed)
Pt refusing cpap for the night. ?

## 2022-05-15 ENCOUNTER — Inpatient Hospital Stay (HOSPITAL_COMMUNITY): Payer: Commercial Managed Care - HMO

## 2022-05-15 DIAGNOSIS — M7989 Other specified soft tissue disorders: Secondary | ICD-10-CM | POA: Diagnosis not present

## 2022-05-15 DIAGNOSIS — M79602 Pain in left arm: Secondary | ICD-10-CM

## 2022-05-15 DIAGNOSIS — I472 Ventricular tachycardia, unspecified: Secondary | ICD-10-CM | POA: Diagnosis not present

## 2022-05-15 LAB — COMPREHENSIVE METABOLIC PANEL
ALT: 47 U/L — ABNORMAL HIGH (ref 0–44)
AST: 50 U/L — ABNORMAL HIGH (ref 15–41)
Albumin: <1.5 g/dL — ABNORMAL LOW (ref 3.5–5.0)
Alkaline Phosphatase: 138 U/L — ABNORMAL HIGH (ref 38–126)
Anion gap: 7 (ref 5–15)
BUN: 6 mg/dL — ABNORMAL LOW (ref 8–23)
CO2: 24 mmol/L (ref 22–32)
Calcium: 7.5 mg/dL — ABNORMAL LOW (ref 8.9–10.3)
Chloride: 102 mmol/L (ref 98–111)
Creatinine, Ser: 0.65 mg/dL (ref 0.61–1.24)
GFR, Estimated: >60 mL/min (ref >60)
Glucose, Bld: 135 mg/dL — ABNORMAL HIGH (ref 70–99)
Potassium: 4.2 mmol/L (ref 3.5–5.1)
Sodium: 133 mmol/L — ABNORMAL LOW (ref 135–145)
Total Bilirubin: 0.2 mg/dL — ABNORMAL LOW (ref 0.3–1.2)
Total Protein: 5.4 g/dL — ABNORMAL LOW (ref 6.5–8.1)

## 2022-05-15 LAB — CBC
HCT: 35.9 % — ABNORMAL LOW (ref 39.0–52.0)
Hemoglobin: 10.2 g/dL — ABNORMAL LOW (ref 13.0–17.0)
MCH: 24.9 pg — ABNORMAL LOW (ref 26.0–34.0)
MCHC: 28.4 g/dL — ABNORMAL LOW (ref 30.0–36.0)
MCV: 87.8 fL (ref 80.0–100.0)
Platelets: 327 10*3/uL (ref 150–400)
RBC: 4.09 MIL/uL — ABNORMAL LOW (ref 4.22–5.81)
RDW: 22.9 % — ABNORMAL HIGH (ref 11.5–15.5)
WBC: 14 10*3/uL — ABNORMAL HIGH (ref 4.0–10.5)
nRBC: 0 % (ref 0.0–0.2)

## 2022-05-15 LAB — GLUCOSE, CAPILLARY
Glucose-Capillary: 119 mg/dL — ABNORMAL HIGH (ref 70–99)
Glucose-Capillary: 167 mg/dL — ABNORMAL HIGH (ref 70–99)
Glucose-Capillary: 130 mg/dL — ABNORMAL HIGH (ref 70–99)
Glucose-Capillary: 97 mg/dL (ref 70–99)

## 2022-05-15 LAB — HEPARIN LEVEL (UNFRACTIONATED): Heparin Unfractionated: 0.41 IU/mL (ref 0.30–0.70)

## 2022-05-15 LAB — APTT: aPTT: 64 seconds — ABNORMAL HIGH (ref 24–36)

## 2022-05-15 LAB — MAGNESIUM: Magnesium: 2.1 mg/dL (ref 1.7–2.4)

## 2022-05-15 LAB — PHOSPHORUS: Phosphorus: 2.7 mg/dL (ref 2.5–4.6)

## 2022-05-15 MED ORDER — POLYETHYLENE GLYCOL 3350 17 G PO PACK
17.0000 g | PACK | Freq: Two times a day (BID) | ORAL | Status: DC
  Administered 2022-05-15 – 2022-05-18 (×6): 17 g via ORAL
  Filled 2022-05-15 (×12): qty 1

## 2022-05-15 MED ORDER — ENSURE ENLIVE PO LIQD
237.0000 mL | Freq: Two times a day (BID) | ORAL | Status: DC
  Administered 2022-05-15 – 2022-05-22 (×16): 237 mL via ORAL

## 2022-05-15 MED ORDER — BISACODYL 10 MG RE SUPP: 10.0000 mg | Freq: Every day | RECTAL | Status: DC | PRN

## 2022-05-15 MED ORDER — HEPARIN (PORCINE) 25000 UT/250ML-% IV SOLN
2100.0000 [IU]/h | INTRAVENOUS | Status: DC
  Administered 2022-05-15: 1600 [IU]/h via INTRAVENOUS
  Administered 2022-05-16: 1700 [IU]/h via INTRAVENOUS
  Administered 2022-05-16: 1850 [IU]/h via INTRAVENOUS
  Administered 2022-05-17: 2050 [IU]/h via INTRAVENOUS
  Administered 2022-05-18 – 2022-05-20 (×5): 2100 [IU]/h via INTRAVENOUS
  Filled 2022-05-15 (×9): qty 250

## 2022-05-15 MED ORDER — DIPHENHYDRAMINE-ZINC ACETATE 2-0.1 % EX CREA
TOPICAL_CREAM | Freq: Three times a day (TID) | CUTANEOUS | Status: DC | PRN
Start: 2022-05-15 — End: 2022-05-22

## 2022-05-15 NOTE — Progress Notes (Signed)
ANTICOAGULATION CONSULT NOTE - Initial Consult  Pharmacy Consult for heparin  Indication: DVT  No Known Allergies  Patient Measurements: Height: '6\' 6"'$  (198.1 cm) Weight: 102.1 kg (225 lb 1.4 oz) IBW/kg (Calculated) : 91.4 Heparin Dosing Weight: 102kg  Vital Signs: Temp: 97 F (36.1 C) (06/01 0418) Temp Source: Axillary (06/01 0418) BP: 131/87 (06/01 0418) Pulse Rate: 68 (06/01 0418)  Labs: Recent Labs    05/13/22 0705 05/14/22 0206 05/15/22 0229  HGB 10.3* 9.8* 10.2*  HCT 35.2* 32.4* 35.9*  PLT 334 323 327  CREATININE 0.84 0.74 0.65    Estimated Creatinine Clearance: 122.2 mL/min (by C-G formula based on SCr of 0.65 mg/dL).   Medical History: Past Medical History:  Diagnosis Date   AICD (automatic cardioverter/defibrillator) present 2005   CAD (coronary artery disease) 12/01/2013   Chronic combined systolic and diastolic CHF, NYHA class 1 ([place]) 12/01/2013   Erectile dysfunction 12/01/2013   HTN (hypertension) 12/01/2013   Hyperlipidemia 12/01/2013   Ischemic cardiomyopathy 12/01/2013   Presence of permanent cardiac pacemaker    Sleep apnea     Medications:  Medications Prior to Admission  Medication Sig Dispense Refill Last Dose   acetaminophen (TYLENOL) 500 MG tablet Take 1,000 mg by mouth every 6 (six) hours as needed for moderate pain.   05/07/2022   amiodarone (PACERONE) 200 MG tablet Take 2 tablets (400 mg total) by mouth daily. 60 tablet 0 05/07/2022   apixaban (ELIQUIS) 5 MG TABS tablet TAKE 1 TABLET(5 MG) BY MOUTH TWICE DAILY (Patient taking differently: Take 5 mg by mouth 2 (two) times daily.) 180 tablet 1 05/07/2022 at 2000   atorvastatin (LIPITOR) 80 MG tablet TAKE ONE TABLET BY MOUTH EVERY EVENING (Patient taking differently: Take 80 mg by mouth every evening.) 90 tablet 3 05/07/2022   clopidogrel (PLAVIX) 75 MG tablet TAKE 1 TABLET(75 MG) BY MOUTH DAILY (Patient taking differently: Take 75 mg by mouth daily. TAKE 1 TABLET(75 MG) BY MOUTH DAILY) 90  tablet 2 05/07/2022   dapagliflozin propanediol (FARXIGA) 10 MG TABS tablet Take 1 tablet (10 mg total) by mouth daily. 90 tablet 3 05/07/2022   ezetimibe (ZETIA) 10 MG tablet Take 1 tablet (10 mg total) by mouth daily. 90 tablet 3 05/07/2022   furosemide (LASIX) 20 MG tablet Take 2 tablets (40 mg total) by mouth 3 (three) times a week. (Patient taking differently: Take 40 mg by mouth every Monday, Wednesday, and Friday.) 80 tablet 1 05/07/2022   levETIRAcetam (KEPPRA) 500 MG tablet TAKE 1 TABLET(500 MG) BY MOUTH TWICE DAILY (Patient taking differently: Take 500 mg by mouth 2 (two) times daily.) 60 tablet 3 05/07/2022   metoprolol succinate (TOPROL-XL) 25 MG 24 hr tablet Take one tablet, 25 mg, in the morning and 50 mg, two tablets, in the evening. (Patient taking differently: 25 mg. Per patient taking 25 mg in the morning and 25 mg at night) 135 tablet 3 05/07/2022   oxyCODONE (OXY IR/ROXICODONE) 5 MG immediate release tablet Take 1-2 tablets (5-10 mg total) by mouth every 8 (eight) hours as needed for breakthrough pain. 15 tablet 0 05/07/2022   potassium chloride (KLOR-CON M) 10 MEQ tablet Take 1 tablet (10 mEq total) by mouth daily. 30 tablet 6 05/07/2022   sacubitril-valsartan (ENTRESTO) 49-51 MG Take 1 tablet by mouth 2 (two) times daily. 180 tablet 1 05/07/2022   pantoprazole (PROTONIX) 40 MG tablet TAKE 1 TABLET(40 MG) BY MOUTH DAILY (Patient taking differently: Take 40 mg by mouth daily. TAKE 1 TABLET(40 MG) BY MOUTH  DAILY) 30 tablet 2    sildenafil (VIAGRA) 50 MG tablet Take 1 tablet (50 mg total) by mouth as needed for erectile dysfunction. 30 tablet 4    Scheduled:   atorvastatin  80 mg Oral QPM   dapagliflozin propanediol  10 mg Oral Daily   docusate sodium  100 mg Oral BID   ezetimibe  10 mg Oral Daily   feeding supplement  237 mL Oral BID BM   insulin aspart  0-9 Units Subcutaneous TID WC   levETIRAcetam  500 mg Oral BID   pantoprazole  40 mg Oral BID   phosphorus  250 mg Oral TID    polyethylene glycol  17 g Oral BID   sodium chloride flush  3 mL Intravenous Q12H   sodium chloride flush  5 mL Intracatheter Q8H   Infusions:   sodium chloride     amiodarone 30 mg/hr (05/15/22 0323)   ampicillin-sulbactam (UNASYN) IV 3 g (05/15/22 1315)   heparin      Assessment: Pt was on apixaban prior to admission for AF. He was admitted for recurrent ICD shock and ileus. He is s/p colectomy for adenocarcinoma. Doppler showed brachial DVT today. Apixaban has been on hold and CCS is ok with heparin. We will use high rate and avoid bolus for now.   Goal of Therapy:  Heparin level 0.3-0.7 units/ml Monitor platelets by anticoagulation protocol: Yes   Plan:  Heparin 1600 units/hr Check 6 hr HL/aPTT then daily  Onnie Boer, PharmD, BCIDP, AAHIVP, CPP Infectious Disease Pharmacist 05/15/2022 4:06 PM

## 2022-05-15 NOTE — Progress Notes (Signed)
PROGRESS NOTE    Gregory [name]  OHY:[id number] DOB: 06-21-1961 DOA: 05/08/2022 PCP: Patient, [name]   Brief Narrative:  61 years old male with PMH significant for CAD s/p stenting, chronic systolic heart failure, recurrent VT with ICD placement, atrial fibrillation, diabetes mellitus type 2, history of seizures, hypertension presented in the ED after his AICD gave him a shock.  He felt his heart rate was elevated and then he felt like a shock so he called 911.  He does have history of recurrent VT.  Patient had recent abdominal surgery , states abdominal pain has not improved.  Patient denies any discharge from his incision. He was recently hospitalized from 4/28 - 5/12 for symptomatic anemia. 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  Metastatic involving lymph nodes.  Patient underwent hand-assisted laparoscopic right colectomy on 04/18/2022 so far has received 7 units of PRBCs.  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  IR is consulted.  Patient underwent percutaneous drain and embolization of splenic artery for pseudoaneurysm.  Patient tolerated procedure well.  Patient started on IV antibiotics.  Patient continued to have elevated white cell count despite being on antibiotics. Repeat CT scan shows decreased size of abscess with moderately large collection of gas and fluid remaining.  IR is consulted, scheduled to have repositioning/exchange of existing percutaneous drain.  05/15/2022: Patient seen.  Swelling of the left upper extremity noted.  Doppler ultrasound of the left upper extremity revealed DVT of the brachial vein.  Surgery team has okayed starting heparin drip.  Pharmacy team  has been consulted to assist with anticoagulation.  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  Active Problems:   Ileus following gastrointestinal surgery (HCC)   Hypothyroidism   Leukocytosis   Cancer of right colon (HCC)   Chronic combined systolic and diastolic CHF (congestive heart failure) (HCC)/ischemic cardiomyopathy   CAD S/P percutaneous coronary angioplasty   Paroxysmal atrial fibrillation (HCC)   Controlled type 2 diabetes mellitus without complication, without long-term current use of insulin (HCC)   Essential hypertension   Seizures (HCC)   OSA on CPAP  Recurrent V.Tach /  s/p AICD shock: Patient with history of recurrent VT reports AICD firing, admitted after shock from device.. Patient denies any chest pain.  EKG showed normal sinus rhythm. EP physician consulted.  Patient is started on amiodarone gtt. TSH abnormal. Dose adjusted. Cardiologist states patient will continue to have episodes of V. tach until primary infection resolves. Continued on amiodarone infusion.  VT episodes reduced in frequency. Plan is to switch to PO amiodarone once acute infection controlled.   Ileus status post gastric surgery: Intra-abdominal abscess. S/p hand-assisted laparoscopic right colectomy on 04/18/22. Had post operative ileus during last hospital stay. Now presenting with abdominal pain, last BM 2 days ago and xray finding of ileus General surgery has been consulted. 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 Held eliquis/plavix until IR intervention. Adequate pain control with oxycodone and morphine as needed for breakthrough pain. Patient underwent successful percutaneous drain, and embolization of the splenic artery. Initiated on Zosyn and then transitioned to Unasyn. Continue to have high white cell count despite being on antibiotic. Repeat imaging shows persistent but decreasing size of  abscess. IR  recommended repositioning/exchange of existing percutaneous drain.  Leukocytosis: Likely secondary to intra-abdominal abscess. Lactic acid 1.7. WBC trending down but still elevated. Continued on IV Zosyn.  Cultures growing E. coli and strep anginosus sensitive to Unasyn. Antibiotic changed with IV Unasyn.  Continue to monitor WBC. 05/15/2022: Gradually improving.  Continue to monitor.  Continue antibiotics.   Hypothyroidism:  Could be sick thyroid or due to amiodarone. Free T4 wnl Would repeat levels in 1-2 weeks.   Cancer of right colon [facility]) Recently diagnosed at previous hospitalization. 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  He has follow up with oncology where PET scan is planned and discuss treatment options.   Chronic combined systolic and diastolic CHF: Echo 7/61/6073: LVEF 35-40%, worse compared to 09/22/2020 (LVEF 40-45%).  Clinically euvolemic.  Continue farxiga Metoprolol XL and Entresto is on hold with soft bp/hypotension.   Paroxysmal atrial fibrillation (HCC) Patient remains in normal sinus rhythm now. Continue amiodarone IV for now,  change to p.o. once acute infection improves or controlled. Eliquis resumed but discontinued again, resume once IR prodeure completed.  CAD S/P percutaneous coronary angioplasty S/p LHC on 04/17/22 recommendations:  Anatomy is similar to prior with the following exceptions: 50% to 70% stenosis proximal to the large first obtuse marginal.  40 to 50% stenosis proximal to the stent segment in the first diagonal. He denies any chest pain, troponin mildly elevated. Continue medical management.  Resume Plavix tomorrow.   Type 2 diabetes without complication. Hb A1c 5.1.  Well-controlled. Continue farxiga for CHF SSI and accuchecks per protocol.     Essential hypertension Soft bp, systolic <710.   Hold entrestro, toprol-xl and lasix    Seizures (HCC) Continue keppra. Seizure precautions    OSA on CPAP Uses cpap at night  Continue qhs.  Lt. leg swelling: Venous duplex negative for DVT.  Left upper extremity swelling: -Venous Doppler ultrasound revealed DVT of the left brachial vein. -Patient has history of recent metastatic colon cancer. -Patient will be started on IV heparin.   DVT prophylaxis: TED hose Code Status: Full code. Family Communication:No family at bed side. Disposition Plan:    Status is: Inpatient Remains inpatient appropriate because: Admitted for AICD shock found to have intra-abdominal abscess and pseudoaneurysm of splenic artery .  Patient underwent percutaneous drain and embolization of the splenic artery.  Repeat CT scan shows decreasing but persistent abdominal abscess.  IR recommended repositioning/exchange of existing percutaneous drain.  Anticipated discharge home in 1 to 2 days.  Consultants:  General surgery, IR,  Procedures: CT abdomen pelvis Antimicrobials:  Anti-infectives (From admission, onward)    Start     Dose/Rate Route Frequency Ordered Stop   05/13/22 1800  Ampicillin-Sulbactam (UNASYN) 3 g in sodium chloride 0.9 % 100 mL IVPB        3 g 200 mL/hr over 30 Minutes Intravenous Every 6 hours 05/13/22 1136     05/08/22 2200  piperacillin-tazobactam (ZOSYN) IVPB 3.375 g  Status:  Discontinued        3.375 g 12.5 mL/hr over 240 Minutes Intravenous Every 8 hours 05/08/22 2008 05/13/22 1136       Subjective: -No new complaints. -Left upper extremity swelling noted.  Objective: Vitals:   05/14/22 1220 05/14/22 1257 05/14/22 1957 05/15/22 0418  BP: 118/82 123/84 106/68 131/87  Pulse: 72  79 68  Resp: '15 16 18 16  '$ Temp:  Marland Kitchen)  97.3 F (36.3 C) 97.8 F (36.6 C) (!) 97 F (36.1 C)  TempSrc:  Oral Oral Axillary  SpO2: 98%  96% 100%  Weight:      Height:         Intake/Output Summary (Last 24 hours) at 05/15/2022 1556 Last data filed at 05/15/2022 0828 Gross per 24 hour  Intake 384.52 ml  Output 1625 ml  Net -1240.48 ml    Filed Weights   05/08/22 0841 05/08/22 1626  Weight: 95.7 kg 102.1 kg    Examination:  General exam: Awake and alert.  Patient is not in any distress. HEENT: Patient is pale. Neck: Supple. Respiratory system: CTA bilaterally. Cardiovascular system: S1-S2 heard. Gastrointestinal system: Abdomen is soft. Central nervous system: Alert and oriented X 3 . No focal neurological deficits. Extremities: Swelling of the left upper extremity.      Data Reviewed: I have personally reviewed following labs and imaging studies  CBC: Recent Labs  Lab 05/11/22 0251 05/12/22 0116 05/13/22 0705 05/14/22 0206 05/15/22 0229  WBC 28.6* 22.1* 17.7* 22.5* 14.0*  HGB 10.7* 10.4* 10.3* 9.8* 10.2*  HCT 34.5* 34.8* 35.2* 32.4* 35.9*  MCV 82.3 82.7 82.8 82.9 87.8  PLT 337 349 334 323 161    Basic Metabolic Panel: Recent Labs  Lab 05/10/22 0315 05/10/22 0636 05/11/22 0251 05/12/22 0116 05/13/22 0705 05/14/22 0206 05/15/22 0229 05/15/22 0730  NA 128*  --  129* 131* 130* 130* 133*  --   K 5.2*   < > 4.7 4.4 4.1 4.4 4.2  --   CL 98  --  99 98 100 99 102  --   CO2 19*  --  '22 26 27 26 24  '$ --   GLUCOSE 119*  --  127* 108* 131* 109* 135*  --   BUN 22  --  15 9 7* 5* 6*  --   CREATININE 0.86  --  0.73 0.71 0.84 0.74 0.65  --   CALCIUM 7.8*  --  7.7* 7.8* 7.6* 7.7* 7.5*  --   MG 2.1  --  2.1  --  2.1 2.1  --  2.1  PHOS 4.3  --  2.7  --   --  2.3* 2.7  --    < > = values in this interval not displayed.    GFR: Estimated Creatinine Clearance: 122.2 mL/min (by C-G formula based on SCr of 0.65 mg/dL). Liver Function Tests: Recent Labs  Lab 05/11/22 0251 05/12/22 0116 05/13/22 0705 05/14/22 0206 05/15/22 0229  AST 70* 80* 70* 69* 50*  ALT 44 52* 56* 55* 47*  ALKPHOS 156* 174* 174* 164* 138*  BILITOT 0.5 0.3 0.1* 0.2*  0.2*  PROT 5.2* 5.4* 5.5* 5.6* 5.4*  ALBUMIN <1.5* <1.5* <1.5* <1.5* <1.5*    No results for input(s): LIPASE, AMYLASE in the last 168 hours. No results for input(s): AMMONIA in the last 168 hours. Coagulation Profile: Recent Labs  Lab 05/08/22 1637 05/09/22 1325  INR 1.7* 1.6*    Cardiac Enzymes: No results for input(s): CKTOTAL, CKMB, CKMBINDEX, TROPONINI in the last 168 hours. BNP (last 3 results) No results for input(s): PROBNP in the last 8760 hours. HbA1C: No results for input(s): HGBA1C in the last 72 hours.  CBG: Recent Labs  Lab 05/14/22 1727 05/14/22 2138 05/15/22 0741 05/15/22 1314 05/15/22 1537  GLUCAP 73 132* 119* 167* 130*    Lipid Profile: No results for input(s): CHOL, HDL, LDLCALC, TRIG, CHOLHDL, LDLDIRECT in the last 72 hours. Thyroid Function Tests:  No results for input(s): TSH, T4TOTAL, FREET4, T3FREE, THYROIDAB in the last 72 hours.  Anemia Panel: No results for input(s): VITAMINB12, FOLATE, FERRITIN, TIBC, IRON, RETICCTPCT in the last 72 hours. Sepsis Labs: Recent Labs  Lab 05/08/22 1637 05/08/22 2017  LATICACIDVEN 1.9 1.3     Recent Results (from the past 240 hour(s))  Aerobic/Anaerobic Culture w Gram Stain (surgical/deep wound)     Status: None   Collection Time: 05/09/22  5:38 PM   Specimen: Abscess  Result Value Ref Range Status   Specimen Description ABSCESS  Final   Special Requests NONE  Final   Gram Stain   Final    ABUNDANT WBC PRESENT,BOTH PMN AND MONONUCLEAR FEW GRAM POSITIVE COCCI IN CLUSTERS FEW GRAM POSITIVE COCCI IN CHAINS RARE GRAM NEGATIVE RODS    Culture   Final    ABUNDANT STREPTOCOCCUS ANGINOSIS MODERATE ESCHERICHIA COLI ABUNDANT BACTEROIDES FRAGILIS BETA LACTAMASE POSITIVE Performed at [facility], [place] [address] [place], [place] [address]    Report Status 05/13/2022 FINAL  Final   Organism ID, Bacteria ESCHERICHIA COLI  Final   Organism ID, Bacteria STREPTOCOCCUS ANGINOSIS  Final       Susceptibility   Escherichia coli - MIC*    AMPICILLIN 8 SENSITIVE Sensitive     CEFAZOLIN <=4 SENSITIVE Sensitive     CEFEPIME <=0.12 SENSITIVE Sensitive     CEFTAZIDIME <=1 SENSITIVE Sensitive     CEFTRIAXONE <=0.25 SENSITIVE Sensitive     CIPROFLOXACIN <=0.25 SENSITIVE Sensitive     GENTAMICIN <=1 SENSITIVE Sensitive     IMIPENEM <=0.25 SENSITIVE Sensitive     TRIMETH/SULFA <=20 SENSITIVE Sensitive     AMPICILLIN/SULBACTAM <=2 SENSITIVE Sensitive     PIP/TAZO <=4 SENSITIVE Sensitive     * MODERATE ESCHERICHIA COLI   Streptococcus anginosis - MIC*    PENICILLIN <=0.06 SENSITIVE Sensitive     CEFTRIAXONE <=0.12 SENSITIVE Sensitive     ERYTHROMYCIN 4 RESISTANT Resistant     LEVOFLOXACIN 0.5 SENSITIVE Sensitive     VANCOMYCIN 0.5 SENSITIVE Sensitive     * ABUNDANT STREPTOCOCCUS ANGINOSIS     Radiology Studies: IR Catheter Tube Change  Result Date: 05/14/2022 CLINICAL DATA:  61 year old male with history of abdominal abscess status post percutaneous drain placement on 05/09/2022. Decreased drain output despite CT evidence of persistent large intra-abdominal fluid collection. EXAM: IR CATHETER TUBE CHANGE COMPARISON:  None Available. CONTRAST:  10 mL Omnipaque 300-administered via the percutaneous drainage catheter. MEDICATIONS: None. ANESTHESIA/SEDATION: Moderate (conscious) sedation was employed during this procedure. A total of Versed 2 mg and Fentanyl 100 mcg was administered intravenously. Moderate Sedation Time: 10 minutes. The patient's level of consciousness and vital signs were monitored continuously by radiology nursing throughout the procedure under my direct supervision. FLUOROSCOPY TIME:  1 minute, 30 mGy TECHNIQUE: Patient was positioned supine on the fluoroscopy table. The external portion of the existing percutaneous drainage catheter as well as the surrounding skin was prepped and draped in usual sterile fashion. A preprocedural spot fluoroscopic image was obtained of the  existing percutaneous drainage catheter. A small amount of contrast was injected via the existing percutaneous drainage catheter and several fluoroscopic images were obtained. The external portion of the percutaneous drainage catheter was cut and cannulated with a short Amplatz wire. Under intermittent fluoroscopic guidance, the existing percutaneous drainage catheter was repositioned and exchanged for a new 78 [place], biliary type percutaneous drainage catheter with end coiled and locked within the lower midline abdomen. Contrast injection confirmed appropriate position functionality of  the percutaneous drainage catheter. Approximately 100 mL of opaque, yellow-green malodorous fluid was aspirated. The percutaneous drainage catheter was connected to a suction bulb and secured in place within interrupted suture and a StatLock device. A dressing was applied. The patient tolerated the procedure well without immediate postprocedural complication. FINDINGS: Indwelling 29 French catheter adjacent to the right lobe of the liver, inappropriately draining abdominal fluid collection. Successful drain repositioning to midline lower abdomen yielding approximately 100 mL purulent fluid. IMPRESSION: Successful drain exchange and repositioning for a 14 [place], biliary type drainage catheter positioned in the midline lower abdomen yielding approximately 100 mL of purulent fluid. PLAN: Keep to bulb suction.  Increase frequency in volume of flushing. [name], MD [facility] Electronically Signed   By: [name] M.D.   On: 05/14/2022 13:33   VAS [place] UPPER EXTREMITY VENOUS DUPLEX  Result Date: 05/15/2022 UPPER VENOUS STUDY  Patient Name:  Gregory [name]  Date of Exam:   05/15/2022 Medical Rec #: [id number]                  Accession #:    [id number] Date of Birth: 02/21/1961                  Patient Gender: M Patient Age:   72 years Exam Location:  [facility] Procedure:      VAS [place] UPPER EXTREMITY VENOUS DUPLEX Referring Phys: Yehuda Savannah Therron Sells --------------------------------------------------------------------------------  Indications: Pain, and Swelling Limitations: Edema, pain with compression, defibrillator in left chest. Comparison Study: Prior left upper extremity venous duplex done 04/21/22                   indicating age indeterminate basilic vein thrombus Performing Technologist: Sharion Dove RVS  Examination Guidelines: A complete evaluation includes B-mode imaging, spectral Doppler, color Doppler, and power Doppler as needed of all accessible portions of each vessel. Bilateral testing is considered an integral part of a complete examination. Limited examinations for reoccurring indications may be performed as noted.  Right Findings: +----------+------------+---------+-----------+----------+-------+ RIGHT     CompressiblePhasicitySpontaneousPropertiesSummary +----------+------------+---------+-----------+----------+-------+ Subclavian               Yes       Yes                      +----------+------------+---------+-----------+----------+-------+  Left Findings: +----------+------------+---------+-----------+----------+-------------------+ LEFT      CompressiblePhasicitySpontaneousProperties      Summary       +----------+------------+---------+-----------+----------+-------------------+ IJV           Full       Yes       Yes                                  +----------+------------+---------+-----------+----------+-------------------+ Subclavian               Yes       Yes                                  +----------+------------+---------+-----------+----------+-------------------+ Axillary                 Yes       Yes                                  +----------+------------+---------+-----------+----------+-------------------+  Brachial      None       No        No               acute mid upper  arm +----------+------------+---------+-----------+----------+-------------------+ Radial        Full                                                      +----------+------------+---------+-----------+----------+-------------------+ Ulnar         Full                                                      +----------+------------+---------+-----------+----------+-------------------+ Cephalic      Full                                                      +----------+------------+---------+-----------+----------+-------------------+ Basilic       Full                                                      +----------+------------+---------+-----------+----------+-------------------+  Summary:  Right: No evidence of thrombosis in the subclavian.  Left: No evidence of superficial vein thrombosis in the upper extremity. Findings consistent with acute deep vein thrombosis involving the left brachial veins above AC, coursing to the mid upper arm.. Findings for superficial vein thrombosis appear resolved from previous study.  *See table(s) above for measurements and observations.     Preliminary     Scheduled Meds:  atorvastatin  80 mg Oral QPM   dapagliflozin propanediol  10 mg Oral Daily   docusate sodium  100 mg Oral BID   ezetimibe  10 mg Oral Daily   feeding supplement  237 mL Oral BID BM   insulin aspart  0-9 Units Subcutaneous TID WC   levETIRAcetam  500 mg Oral BID   pantoprazole  40 mg Oral BID   phosphorus  250 mg Oral TID   polyethylene glycol  17 g Oral BID   sodium chloride flush  3 mL Intravenous Q12H   sodium chloride flush  5 mL Intracatheter Q8H   Continuous Infusions:  sodium chloride     amiodarone 30 mg/hr (05/15/22 0323)   ampicillin-sulbactam (UNASYN) IV 3 g (05/15/22 1315)     LOS: 7 days    Time spent: 2 mins    Bonnell Public, MD [facility]   If 7PM-7AM, please contact night-coverage

## 2022-05-15 NOTE — Progress Notes (Signed)
Mobility Specialist Progress Note    05/15/22 1643  Mobility  Activity Stood at bedside  Level of Assistance Contact guard assist, steadying assist  Assistive Device Front wheel walker  Activity Response Tolerated fair  $Mobility charge 1 Mobility   Able to tolerate standing for bed change and a few marches. C/o feeling the "woozies". Returned to sitting EOB with RN and NT present.   Hildred Alamin Mobility Specialist  Primary: 5N M.S. Phone: [phone] Secondary: 6N M.S. Phone: [phone]

## 2022-05-15 NOTE — Progress Notes (Signed)
VASCULAR LAB    Left upper extremity venous duplex has been performed.  See CV proc for preliminary results.  Messaged results to Dr. Marthenia Rolling via secure chat  Sharion Dove, RVT 05/15/2022, 3:18 PM

## 2022-05-15 NOTE — Progress Notes (Signed)
Patient ID: Gregory [name], male   DOB: 03-25-1961, 61 y.o.   MRN: [id number] [facility] Surgery Progress Note     Subjective: CC-  Comfortable this morning. Still feels a little bloated. Denies n/v. Passing flatus, no BM in several days. Drain exchanged and repositioned yesterday, draining purulent fluid. WBC down 14, afebrile.  Objective: Vital signs in last 24 hours: Temp:  [97 F (36.1 C)-97.8 F (36.6 C)] 97 F (36.1 C) (06/01 0418) Pulse Rate:  [68-79] 68 (06/01 0418) Resp:  [11-18] 16 (06/01 0418) BP: (106-131)/(68-87) 131/87 (06/01 0418) SpO2:  [92 %-100 %] 100 % (06/01 0418) Last BM Date : 05/13/22  Intake/Output from previous day: 05/31 0701 - 06/01 0700 In: 360 [P.O.:360] Out: 1100 [Urine:900; Drains:200] Intake/Output this shift: Total I/O In: 24.5 [I.V.:24.5] Out: 500 [Urine:400; Drains:100]  PE: Gen:  Alert, NAD, pleasant Pulm:  rate and effort normal on room air Abd: mild distension but soft, mild upper abdominal TTP without rebound or guarding, incision cdi, drain with purulent fluid in bulb  Lab Results:  Recent Labs    05/14/22 0206 05/15/22 0229  WBC 22.5* 14.0*  HGB 9.8* 10.2*  HCT 32.4* 35.9*  PLT 323 327   BMET Recent Labs    05/14/22 0206 05/15/22 0229  NA 130* 133*  K 4.4 4.2  CL 99 102  CO2 26 24  GLUCOSE 109* 135*  BUN 5* 6*  CREATININE 0.74 0.65  CALCIUM 7.7* 7.5*   PT/INR No results for input(s): LABPROT, INR in the last 72 hours. CMP     Component Value Date/Time   NA 133 (L) 05/15/2022 0229   NA 137 04/11/2022 1219   K 4.2 05/15/2022 0229   CL 102 05/15/2022 0229   CO2 24 05/15/2022 0229   GLUCOSE 135 (H) 05/15/2022 0229   BUN 6 (L) 05/15/2022 0229   BUN 13 04/11/2022 1219   CREATININE 0.65 05/15/2022 0229   CREATININE 1.08 12/12/2016 1445   CALCIUM 7.5 (L) 05/15/2022 0229   PROT 5.4 (L) 05/15/2022 0229   PROT 6.5 02/28/2022 1304   ALBUMIN <1.5 (L) 05/15/2022 0229   ALBUMIN 4.0 02/28/2022 1304    AST 50 (H) 05/15/2022 0229   ALT 47 (H) 05/15/2022 0229   ALKPHOS 138 (H) 05/15/2022 0229   BILITOT 0.2 (L) 05/15/2022 0229   BILITOT 0.2 02/28/2022 1304   GFRNONAA >60 05/15/2022 0229   GFRAA 91 11/15/2020 0959   Lipase     Component Value Date/Time   LIPASE 120 (H) 09/09/2020 1810       Studies/Results: IR Catheter Tube Change  Result Date: 05/14/2022 CLINICAL DATA:  61 year old male with history of abdominal abscess status post percutaneous drain placement on 05/09/2022. Decreased drain output despite CT evidence of persistent large intra-abdominal fluid collection. EXAM: IR CATHETER TUBE CHANGE COMPARISON:  None Available. CONTRAST:  10 mL Omnipaque 300-administered via the percutaneous drainage catheter. MEDICATIONS: None. ANESTHESIA/SEDATION: Moderate (conscious) sedation was employed during this procedure. A total of Versed 2 mg and Fentanyl 100 mcg was administered intravenously. Moderate Sedation Time: 10 minutes. The patient's level of consciousness and vital signs were monitored continuously by radiology nursing throughout the procedure under my direct supervision. FLUOROSCOPY TIME:  1 minute, 30 mGy TECHNIQUE: Patient was positioned supine on the fluoroscopy table. The external portion of the existing percutaneous drainage catheter as well as the surrounding skin was prepped and draped in usual sterile fashion. A preprocedural spot fluoroscopic image was obtained of the existing percutaneous drainage catheter. A  small amount of contrast was injected via the existing percutaneous drainage catheter and several fluoroscopic images were obtained. The external portion of the percutaneous drainage catheter was cut and cannulated with a short Amplatz wire. Under intermittent fluoroscopic guidance, the existing percutaneous drainage catheter was repositioned and exchanged for a new 85 [place], biliary type percutaneous drainage catheter with end coiled and locked within the lower midline  abdomen. Contrast injection confirmed appropriate position functionality of the percutaneous drainage catheter. Approximately 100 mL of opaque, yellow-green malodorous fluid was aspirated. The percutaneous drainage catheter was connected to a suction bulb and secured in place within interrupted suture and a StatLock device. A dressing was applied. The patient tolerated the procedure well without immediate postprocedural complication. FINDINGS: Indwelling 97 French catheter adjacent to the right lobe of the liver, inappropriately draining abdominal fluid collection. Successful drain repositioning to midline lower abdomen yielding approximately 100 mL purulent fluid. IMPRESSION: Successful drain exchange and repositioning for a 14 [place], biliary type drainage catheter positioned in the midline lower abdomen yielding approximately 100 mL of purulent fluid. PLAN: Keep to bulb suction.  Increase frequency in volume of flushing. Gregory Cancer, MD [facility] Interventional Radiology Specialists [facility] Radiology Electronically Signed   By: Gregory [name] M.D.   On: 05/14/2022 13:33    Anti-infectives: Anti-infectives (From admission, onward)    Start     Dose/Rate Route Frequency Ordered Stop   05/13/22 1800  Ampicillin-Sulbactam (UNASYN) 3 g in sodium chloride 0.9 % 100 mL IVPB        3 g 200 mL/hr over 30 Minutes Intravenous Every 6 hours 05/13/22 1136     05/08/22 2200  piperacillin-tazobactam (ZOSYN) IVPB 3.375 g  Status:  Discontinued        3.375 g 12.5 mL/hr over 240 Minutes Intravenous Every 8 hours 05/08/22 2008 05/13/22 1136        Assessment/Plan POD#27 s/p lap assisted R colectomy for adenocarcinoma, Dr. 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 Has seen Dr. Burr Medico - CT 5/25 with large post-op  abscess - IR drainage 5/26 with 2.7L out initially, 250cc documented for the last 24h, Culture growing E COLI and STREP ANGINOSIS >> narrowed abx to unasyn 5/30 - also concern for splenic artery PSA on CT, CTA 5/26 with concern for dissection at bifurcation of celiac as well as some extravasation from distal branch of splenic artery, he has had this embolized 5/26. H/h stable -repeat CT 5/29 with interval significant decrease in size of the large abdominal and pelvic abscess with a moderately large collection of gas and fluid remaining >> s/p IR drain exchange and repositioning 5/31 -WBC trending down. Continue abx and drain.    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  Ischemic cardiomyopathy CHF, systolic and diastolic, LV EF 35 to 483%Atrial fibrillation     LOS: 7 days    BWellington Hampshire PNorth [facility] LL[facility]gery 05/15/2022, 8:51 AM Please see Amion for pager number during day hours 7:00am-4:30pm

## 2022-05-15 NOTE — Progress Notes (Signed)
  Tele reviewed.He had short run of VT yesterday am that self terminated.   Pt tolerated IR procedure without difficulty or further arrhyhtmia.   Consider transition to po amiodarone tomorrow if no further procedures plan. Otherwise, consider continuing IV amiodarone.   Legrand Como [address] [place], PA-C  05/15/2022 8:32 AM

## 2022-05-16 DIAGNOSIS — I472 Ventricular tachycardia, unspecified: Secondary | ICD-10-CM | POA: Diagnosis not present

## 2022-05-16 LAB — GLUCOSE, CAPILLARY
Glucose-Capillary: 114 mg/dL — ABNORMAL HIGH (ref 70–99)
Glucose-Capillary: 100 mg/dL — ABNORMAL HIGH (ref 70–99)
Glucose-Capillary: 86 mg/dL (ref 70–99)
Glucose-Capillary: 120 mg/dL — ABNORMAL HIGH (ref 70–99)

## 2022-05-16 LAB — APTT
aPTT: 55 seconds — ABNORMAL HIGH (ref 24–36)
aPTT: 69 seconds — ABNORMAL HIGH (ref 24–36)

## 2022-05-16 LAB — CBC
HCT: 31.6 % — ABNORMAL LOW (ref 39.0–52.0)
Hemoglobin: 9.6 g/dL — ABNORMAL LOW (ref 13.0–17.0)
MCH: 25.3 pg — ABNORMAL LOW (ref 26.0–34.0)
MCHC: 30.4 g/dL (ref 30.0–36.0)
MCV: 83.4 fL (ref 80.0–100.0)
Platelets: 307 10*3/uL (ref 150–400)
RBC: 3.79 MIL/uL — ABNORMAL LOW (ref 4.22–5.81)
RDW: 22.3 % — ABNORMAL HIGH (ref 11.5–15.5)
WBC: 14.1 10*3/uL — ABNORMAL HIGH (ref 4.0–10.5)
nRBC: 0 % (ref 0.0–0.2)

## 2022-05-16 LAB — HEPARIN LEVEL (UNFRACTIONATED): Heparin Unfractionated: 0.32 IU/mL (ref 0.30–0.70)

## 2022-05-16 MED ORDER — AMIODARONE IV BOLUS ONLY 150 MG/100ML: 150.0000 mg | Freq: Once | INTRAVENOUS | Status: DC

## 2022-05-16 MED ORDER — FLEET ENEMA 7-19 GM/118ML RE ENEM: 1.0000 | ENEMA | Freq: Every day | RECTAL | Status: DC | PRN

## 2022-05-16 NOTE — Progress Notes (Signed)
[place] for heparin (apixaban on hold) Indication: DVT, afib  No Known Allergies  Patient Measurements: Height: '6\' 6"'$  (198.1 cm) Weight: 102.1 kg (225 lb 1.4 oz) IBW/kg (Calculated) : 91.4 Heparin Dosing Weight: 102kg  Vital Signs: Temp: 97.5 F (36.4 C) (06/02 0734) Temp Source: Oral (06/02 0734) BP: 130/81 (06/02 0343) Pulse Rate: 69 (06/02 0734)  Labs: Recent Labs    05/14/22 0206 05/15/22 0229 05/15/22 2250 05/16/22 0619  HGB 9.8* 10.2*  --  9.6*  HCT 32.4* 35.9*  --  31.6*  PLT 323 327  --  307  APTT  --   --  64* 55*  HEPARINUNFRC  --   --  0.41 0.32  CREATININE 0.74 0.65  --   --      Estimated Creatinine Clearance: 122.2 mL/min (by C-G formula based on SCr of 0.65 mg/dL).   Medical History: Past Medical History:  Diagnosis Date   AICD (automatic cardioverter/defibrillator) present 2005   CAD (coronary artery disease) 12/01/2013   Chronic combined systolic and diastolic CHF, NYHA class 1 ([place]) 12/01/2013   Erectile dysfunction 12/01/2013   HTN (hypertension) 12/01/2013   Hyperlipidemia 12/01/2013   Ischemic cardiomyopathy 12/01/2013   Presence of permanent cardiac pacemaker    Sleep apnea     Medications:  Medications Prior to Admission  Medication Sig Dispense Refill Last Dose   acetaminophen (TYLENOL) 500 MG tablet Take 1,000 mg by mouth every 6 (six) hours as needed for moderate pain.   05/07/2022   amiodarone (PACERONE) 200 MG tablet Take 2 tablets (400 mg total) by mouth daily. 60 tablet 0 05/07/2022   apixaban (ELIQUIS) 5 MG TABS tablet TAKE 1 TABLET(5 MG) BY MOUTH TWICE DAILY (Patient taking differently: Take 5 mg by mouth 2 (two) times daily.) 180 tablet 1 05/07/2022 at 2000   atorvastatin (LIPITOR) 80 MG tablet TAKE ONE TABLET BY MOUTH EVERY EVENING (Patient taking differently: Take 80 mg by mouth every evening.) 90 tablet 3 05/07/2022   clopidogrel (PLAVIX) 75 MG tablet TAKE 1 TABLET(75 MG) BY MOUTH DAILY  (Patient taking differently: Take 75 mg by mouth daily. TAKE 1 TABLET(75 MG) BY MOUTH DAILY) 90 tablet 2 05/07/2022   dapagliflozin propanediol (FARXIGA) 10 MG TABS tablet Take 1 tablet (10 mg total) by mouth daily. 90 tablet 3 05/07/2022   ezetimibe (ZETIA) 10 MG tablet Take 1 tablet (10 mg total) by mouth daily. 90 tablet 3 05/07/2022   furosemide (LASIX) 20 MG tablet Take 2 tablets (40 mg total) by mouth 3 (three) times a week. (Patient taking differently: Take 40 mg by mouth every Monday, Wednesday, and Friday.) 80 tablet 1 05/07/2022   levETIRAcetam (KEPPRA) 500 MG tablet TAKE 1 TABLET(500 MG) BY MOUTH TWICE DAILY (Patient taking differently: Take 500 mg by mouth 2 (two) times daily.) 60 tablet 3 05/07/2022   metoprolol succinate (TOPROL-XL) 25 MG 24 hr tablet Take one tablet, 25 mg, in the morning and 50 mg, two tablets, in the evening. (Patient taking differently: 25 mg. Per patient taking 25 mg in the morning and 25 mg at night) 135 tablet 3 05/07/2022   oxyCODONE (OXY IR/ROXICODONE) 5 MG immediate release tablet Take 1-2 tablets (5-10 mg total) by mouth every 8 (eight) hours as needed for breakthrough pain. 15 tablet 0 05/07/2022   potassium chloride (KLOR-CON M) 10 MEQ tablet Take 1 tablet (10 mEq total) by mouth daily. 30 tablet 6 05/07/2022   sacubitril-valsartan (ENTRESTO) 49-51 MG Take 1 tablet by  mouth 2 (two) times daily. 180 tablet 1 05/07/2022   pantoprazole (PROTONIX) 40 MG tablet TAKE 1 TABLET(40 MG) BY MOUTH DAILY (Patient taking differently: Take 40 mg by mouth daily. TAKE 1 TABLET(40 MG) BY MOUTH DAILY) 30 tablet 2    sildenafil (VIAGRA) 50 MG tablet Take 1 tablet (50 mg total) by mouth as needed for erectile dysfunction. 30 tablet 4    Scheduled:   atorvastatin  80 mg Oral QPM   dapagliflozin propanediol  10 mg Oral Daily   docusate sodium  100 mg Oral BID   ezetimibe  10 mg Oral Daily   feeding supplement  237 mL Oral BID BM   insulin aspart  0-9 Units Subcutaneous TID WC    levETIRAcetam  500 mg Oral BID   pantoprazole  40 mg Oral BID   polyethylene glycol  17 g Oral BID   sodium chloride flush  3 mL Intravenous Q12H   sodium chloride flush  5 mL Intracatheter Q8H   Infusions:   sodium chloride     amiodarone 30 mg/hr (05/16/22 0418)   ampicillin-sulbactam (UNASYN) IV 3 g (05/16/22 1223)   heparin 1,700 Units/hr (05/16/22 0738)    Assessment: Pt was on apixaban prior to admission for AF. He was admitted for recurrent ICD shock and ileus. He is s/p colectomy for adenocarcinoma. Doppler showed brachial DVT today. Apixaban has been on hold and CCS is ok with heparin. We will use high rate and avoid bolus for now.   Heparin level at goal, however may be seeing some effects of recent apixaban. Aptt below goal after rate adjustment overnight.  No bleeding or infusion issues noted.   Goal of Therapy:  Heparin level 0.3-0.7 units/ml aPTT 66-102 secs Monitor platelets by anticoagulation protocol: Yes   Plan:  Increase heparin to 1850 units/hr Re-check aPTT this evening  Erin Hearing PharmD., BCPS Clinical Pharmacist 05/16/2022 2:26 PM

## 2022-05-16 NOTE — Progress Notes (Addendum)
Electrophysiology Rounding Note  Patient Name: Gregory [name] Date of Encounter: 05/16/2022  Primary Cardiologist: Gregory Klein, MD Electrophysiologist: Gregory Axe, MD   Subjective   Abdomen is sore, as is left UE. LUE DVT identified.   Inpatient Medications    Scheduled Meds:  atorvastatin  80 mg Oral QPM   dapagliflozin propanediol  10 mg Oral Daily   docusate sodium  100 mg Oral BID   ezetimibe  10 mg Oral Daily   feeding supplement  237 mL Oral BID BM   insulin aspart  0-9 Units Subcutaneous TID WC   levETIRAcetam  500 mg Oral BID   pantoprazole  40 mg Oral BID   phosphorus  250 mg Oral TID   polyethylene glycol  17 g Oral BID   sodium chloride flush  3 mL Intravenous Q12H   sodium chloride flush  5 mL Intracatheter Q8H   Continuous Infusions:  sodium chloride     amiodarone 30 mg/hr (05/16/22 0418)   ampicillin-sulbactam (UNASYN) IV 3 g (05/16/22 0517)   heparin 1,700 Units/hr (05/16/22 0350)   PRN Meds: sodium chloride, acetaminophen **OR** acetaminophen, bisacodyl, diphenhydrAMINE-zinc acetate, HYDROmorphone (DILAUDID) injection, oxyCODONE, sodium chloride flush   Vital Signs    Vitals:   05/14/22 1957 05/15/22 0418 05/15/22 2027 05/16/22 0343  BP: 106/68 131/87 120/79 130/81  Pulse: 79 68 72 68  Resp: '18 16 16 18  '$ Temp: 97.8 F (36.6 C) (!) 97 F (36.1 C) 98.1 F (36.7 C) (!) 97.4 F (36.3 C)  TempSrc: Oral Axillary Oral Oral  SpO2: 96% 100% 100%   Weight:      Height:        Intake/Output Summary (Last 24 hours) at 05/16/2022 0731 Last data filed at 05/16/2022 0522 Gross per 24 hour  Intake 2564.07 ml  Output 1245 ml  Net 1319.07 ml   Filed Weights   05/08/22 0841 05/08/22 1626  Weight: 95.7 kg 102.1 kg    Physical Exam    GEN- The patient is well appearing, alert and oriented x 3 today.   Head- normocephalic, atraumatic Eyes-  Sclera clear, conjunctiva pink Ears- hearing intact Oropharynx- clear Neck- supple Lungs-  Clear to ausculation bilaterally, normal work of breathing Heart- Regular rate and rhythm, no murmurs, rubs or gallops GI- soft, NT, ND, + BS Extremities- no clubbing or cyanosis. No edema Skin- no rash or lesion Psych- euthymic mood, full affect Neuro- strength and sensation are intact  Labs    CBC Recent Labs    05/15/22 0229 05/16/22 0619  WBC 14.0* 14.1*  HGB 10.2* 9.6*  HCT 35.9* 31.6*  MCV 87.8 83.4  PLT 327 256   Basic Metabolic Panel Recent Labs    05/14/22 0206 05/15/22 0229 05/15/22 0730  NA 130* 133*  --   K 4.4 4.2  --   CL 99 102  --   CO2 26 24  --   GLUCOSE 109* 135*  --   BUN 5* 6*  --   CREATININE 0.74 0.65  --   CALCIUM 7.7* 7.5*  --   MG 2.1  --  2.1  PHOS 2.3* 2.7  --    Liver Function Tests Recent Labs    05/14/22 0206 05/15/22 0229  AST 69* 50*  ALT 55* 47*  ALKPHOS 164* 138*  BILITOT 0.2* 0.2*  PROT 5.6* 5.4*  ALBUMIN <1.5* <1.5*   No results for input(s): LIPASE, AMYLASE in the last 72 hours. Cardiac Enzymes No results for input(s): CKTOTAL,  CKMB, CKMBINDEX, TROPONINI in the last 72 hours.   Telemetry    NSR 60-70s (personally reviewed)  Radiology    IR Catheter Tube Change  Result Date: 05/14/2022 CLINICAL DATA:  61 year old male with history of abdominal abscess status post percutaneous drain placement on 05/09/2022. Decreased drain output despite CT evidence of persistent large intra-abdominal fluid collection. EXAM: IR CATHETER TUBE CHANGE COMPARISON:  None Available. CONTRAST:  10 mL Omnipaque 300-administered via the percutaneous drainage catheter. MEDICATIONS: None. ANESTHESIA/SEDATION: Moderate (conscious) sedation was employed during this procedure. A total of Versed 2 mg and Fentanyl 100 mcg was administered intravenously. Moderate Sedation Time: 10 minutes. The patient's level of consciousness and vital signs were monitored continuously by radiology nursing throughout the procedure under my direct supervision.  FLUOROSCOPY TIME:  1 minute, 30 mGy TECHNIQUE: Patient was positioned supine on the fluoroscopy table. The external portion of the existing percutaneous drainage catheter as well as the surrounding skin was prepped and draped in usual sterile fashion. A preprocedural spot fluoroscopic image was obtained of the existing percutaneous drainage catheter. A small amount of contrast was injected via the existing percutaneous drainage catheter and several fluoroscopic images were obtained. The external portion of the percutaneous drainage catheter was cut and cannulated with a short Amplatz wire. Under intermittent fluoroscopic guidance, the existing percutaneous drainage catheter was repositioned and exchanged for a new 54 [place], biliary type percutaneous drainage catheter with end coiled and locked within the lower midline abdomen. Contrast injection confirmed appropriate position functionality of the percutaneous drainage catheter. Approximately 100 mL of opaque, yellow-green malodorous fluid was aspirated. The percutaneous drainage catheter was connected to a suction bulb and secured in place within interrupted suture and a StatLock device. A dressing was applied. The patient tolerated the procedure well without immediate postprocedural complication. FINDINGS: Indwelling 31 French catheter adjacent to the right lobe of the liver, inappropriately draining abdominal fluid collection. Successful drain repositioning to midline lower abdomen yielding approximately 100 mL purulent fluid. IMPRESSION: Successful drain exchange and repositioning for a 14 [place], biliary type drainage catheter positioned in the midline lower abdomen yielding approximately 100 mL of purulent fluid. PLAN: Keep to bulb suction.  Increase frequency in volume of flushing. [name], MD [facility] Inter[facility] Radiology Specialists [facility] Radiology Electronically Signed   By: [name] M.D.   On: 05/14/2022 13:33   VAS [place] UPPER  EXTREMITY VENOUS DUPLEX  Result Date: 05/15/2022 UPPER VENOUS STUDY  Patient Name:  Gregory [name]  Date of Exam:   05/15/2022 Medical Rec #: [id number]                  Accession #:    [id number] Date of Birth: 1961/10/18                  Patient Gender: M Patient Age:   61 years Exam Location:  [facility] Procedure:      VAS [place] UPPER EXTREMITY VENOUS DUPLEX Referring Phys: Yehuda Savannah OGBATA --------------------------------------------------------------------------------  Indications: Pain, and Swelling Limitations: Edema, pain with compression, defibrillator in left chest. Comparison Study: Prior left upper extremity venous duplex done 04/21/22                   indicating age indeterminate basilic vein thrombus Performing Technologist: Sharion Dove RVS  Examination Guidelines: A complete evaluation includes B-mode imaging, spectral Doppler, color Doppler, and power Doppler as needed of all accessible portions of each vessel. Bilateral testing is considered an integral part of a complete  examination. Limited examinations for reoccurring indications may be performed as noted.  Right Findings: +----------+------------+---------+-----------+----------+-------+ RIGHT     CompressiblePhasicitySpontaneousPropertiesSummary +----------+------------+---------+-----------+----------+-------+ Subclavian               Yes       Yes                      +----------+------------+---------+-----------+----------+-------+  Left Findings: +----------+------------+---------+-----------+----------+-------------------+ LEFT      CompressiblePhasicitySpontaneousProperties      Summary       +----------+------------+---------+-----------+----------+-------------------+ IJV           Full       Yes       Yes                                  +----------+------------+---------+-----------+----------+-------------------+ Subclavian               Yes       Yes                                   +----------+------------+---------+-----------+----------+-------------------+ Axillary                 Yes       Yes                                  +----------+------------+---------+-----------+----------+-------------------+ Brachial      None       No        No               acute mid upper arm +----------+------------+---------+-----------+----------+-------------------+ Radial        Full                                                      +----------+------------+---------+-----------+----------+-------------------+ Ulnar         Full                                                      +----------+------------+---------+-----------+----------+-------------------+ Cephalic      Full                                                      +----------+------------+---------+-----------+----------+-------------------+ Basilic       Full                                                      +----------+------------+---------+-----------+----------+-------------------+  Summary:  Right: No evidence of thrombosis in the subclavian.  Left: No evidence of superficial vein thrombosis in the upper extremity. Findings consistent with acute deep vein thrombosis involving the left brachial veins above AC, coursing to the mid  upper arm.. Findings for superficial vein thrombosis appear resolved from previous study.  *See table(s) above for measurements and observations.  Diagnosing physician: Orlie Pollen Electronically signed by Orlie Pollen on 05/15/2022 at 5:24:01 PM.    Final     Patient Profile     61 y.o. male admitted with VT storm and abdominal abscess  Assessment & Plan    VT storm  No further sustained episodes.  Continue IV amiodarone for now, will discuss transitioning to po with MD.  Mg 2.1, K 4.2 05/15/2022. Pending today.   2. Abdominal abscess - Drain exchanged 05/14/2022  CT showed smaller but persistent abscess.   3. CAD  No s/s ischemia   4.  Constipation  Per primary, improving  5. Acute LUE DVT On Heparin, eliquis on hold pending procedures.   For questions or updates, please contact [place] Please consult [email or web address] for contact info under Cardiology/STEMI.  Signed, Shirley Friar, PA-C  05/16/2022, 7:31 AM

## 2022-05-16 NOTE — Progress Notes (Signed)
[place] for heparin (apixaban on hold) Indication: DVT, afib  No Known Allergies  Patient Measurements: Height: '6\' 6"'$  (198.1 cm) Weight: 102.1 kg (225 lb 1.4 oz) IBW/kg (Calculated) : 91.4 Heparin Dosing Weight: 102kg  Vital Signs: Temp: 98.1 F (36.7 C) (06/01 2027) Temp Source: Oral (06/01 2027) BP: 120/79 (06/01 2027) Pulse Rate: 72 (06/01 2027)  Labs: Recent Labs    05/13/22 0705 05/14/22 0206 05/15/22 0229 05/15/22 2250  HGB 10.3* 9.8* 10.2*  --   HCT 35.2* 32.4* 35.9*  --   PLT 334 323 327  --   APTT  --   --   --  64*  HEPARINUNFRC  --   --   --  0.41  CREATININE 0.84 0.74 0.65  --      Estimated Creatinine Clearance: 122.2 mL/min (by C-G formula based on SCr of 0.65 mg/dL).   Medical History: Past Medical History:  Diagnosis Date   AICD (automatic cardioverter/defibrillator) present 2005   CAD (coronary artery disease) 12/01/2013   Chronic combined systolic and diastolic CHF, NYHA class 1 ([place]) 12/01/2013   Erectile dysfunction 12/01/2013   HTN (hypertension) 12/01/2013   Hyperlipidemia 12/01/2013   Ischemic cardiomyopathy 12/01/2013   Presence of permanent cardiac pacemaker    Sleep apnea     Medications:  Medications Prior to Admission  Medication Sig Dispense Refill Last Dose   acetaminophen (TYLENOL) 500 MG tablet Take 1,000 mg by mouth every 6 (six) hours as needed for moderate pain.   05/07/2022   amiodarone (PACERONE) 200 MG tablet Take 2 tablets (400 mg total) by mouth daily. 60 tablet 0 05/07/2022   apixaban (ELIQUIS) 5 MG TABS tablet TAKE 1 TABLET(5 MG) BY MOUTH TWICE DAILY (Patient taking differently: Take 5 mg by mouth 2 (two) times daily.) 180 tablet 1 05/07/2022 at 2000   atorvastatin (LIPITOR) 80 MG tablet TAKE ONE TABLET BY MOUTH EVERY EVENING (Patient taking differently: Take 80 mg by mouth every evening.) 90 tablet 3 05/07/2022   clopidogrel (PLAVIX) 75 MG tablet TAKE 1 TABLET(75 MG) BY MOUTH  DAILY (Patient taking differently: Take 75 mg by mouth daily. TAKE 1 TABLET(75 MG) BY MOUTH DAILY) 90 tablet 2 05/07/2022   dapagliflozin propanediol (FARXIGA) 10 MG TABS tablet Take 1 tablet (10 mg total) by mouth daily. 90 tablet 3 05/07/2022   ezetimibe (ZETIA) 10 MG tablet Take 1 tablet (10 mg total) by mouth daily. 90 tablet 3 05/07/2022   furosemide (LASIX) 20 MG tablet Take 2 tablets (40 mg total) by mouth 3 (three) times a week. (Patient taking differently: Take 40 mg by mouth every Monday, Wednesday, and Friday.) 80 tablet 1 05/07/2022   levETIRAcetam (KEPPRA) 500 MG tablet TAKE 1 TABLET(500 MG) BY MOUTH TWICE DAILY (Patient taking differently: Take 500 mg by mouth 2 (two) times daily.) 60 tablet 3 05/07/2022   metoprolol succinate (TOPROL-XL) 25 MG 24 hr tablet Take one tablet, 25 mg, in the morning and 50 mg, two tablets, in the evening. (Patient taking differently: 25 mg. Per patient taking 25 mg in the morning and 25 mg at night) 135 tablet 3 05/07/2022   oxyCODONE (OXY IR/ROXICODONE) 5 MG immediate release tablet Take 1-2 tablets (5-10 mg total) by mouth every 8 (eight) hours as needed for breakthrough pain. 15 tablet 0 05/07/2022   potassium chloride (KLOR-CON M) 10 MEQ tablet Take 1 tablet (10 mEq total) by mouth daily. 30 tablet 6 05/07/2022   sacubitril-valsartan (ENTRESTO) 49-51 MG Take 1  tablet by mouth 2 (two) times daily. 180 tablet 1 05/07/2022   pantoprazole (PROTONIX) 40 MG tablet TAKE 1 TABLET(40 MG) BY MOUTH DAILY (Patient taking differently: Take 40 mg by mouth daily. TAKE 1 TABLET(40 MG) BY MOUTH DAILY) 30 tablet 2    sildenafil (VIAGRA) 50 MG tablet Take 1 tablet (50 mg total) by mouth as needed for erectile dysfunction. 30 tablet 4    Scheduled:   atorvastatin  80 mg Oral QPM   dapagliflozin propanediol  10 mg Oral Daily   docusate sodium  100 mg Oral BID   ezetimibe  10 mg Oral Daily   feeding supplement  237 mL Oral BID BM   insulin aspart  0-9 Units Subcutaneous TID WC    levETIRAcetam  500 mg Oral BID   pantoprazole  40 mg Oral BID   phosphorus  250 mg Oral TID   polyethylene glycol  17 g Oral BID   sodium chloride flush  3 mL Intravenous Q12H   sodium chloride flush  5 mL Intracatheter Q8H   Infusions:   sodium chloride     amiodarone 30 mg/hr (05/15/22 1610)   ampicillin-sulbactam (UNASYN) IV 3 g (05/15/22 2350)   heparin 1,600 Units/hr (05/15/22 1608)    Assessment: Pt was on apixaban prior to admission for AF. He was admitted for recurrent ICD shock and ileus. He is s/p colectomy for adenocarcinoma. Doppler showed brachial DVT today. Apixaban has been on hold and CCS is ok with heparin. We will use high rate and avoid bolus for now.   6/2 AM update:  aPTT below goal  Goal of Therapy:  Heparin level 0.3-0.7 units/ml aPTT 66-102 secs Monitor platelets by anticoagulation protocol: Yes   Plan:  Inc heparin to 1700 units/hr Re-check aPTT and heparin level with AM labs  Narda Bonds, PharmD, [place] Pharmacist Phone: [phone]

## 2022-05-16 NOTE — Progress Notes (Signed)
PROGRESS NOTE    Gregory [name]  FHL:[id number] DOB: [birth year] DOA: 05/08/2022 PCP: Patient, [name]   Brief Narrative:  61 years old male with PMH significant for CAD s/p stenting, chronic systolic heart failure, recurrent VT with ICD placement, atrial fibrillation, diabetes mellitus type 2, history of seizures, hypertension presented in the ED after his AICD gave him a shock.  Recent admission for partially obstructing abdominal mass s/p hemicolectomy(right) 04/18/22 - imaging here shows multiple abdominal lesions considered to be abscesses with pseudoaneurysm of the splenic artery. S/P perc drain and embolization respectively.  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  Active Problems:   Ileus following gastrointestinal surgery (HCC)   Hypothyroidism   Leukocytosis   Cancer of right colon (HCC)   Chronic combined systolic and diastolic CHF (congestive heart failure) (HCC)/ischemic cardiomyopathy   CAD S/P percutaneous coronary angioplasty   Paroxysmal atrial fibrillation (HCC)   Controlled type 2 diabetes mellitus without complication, without long-term current use of insulin (HCC)   Essential hypertension   Seizures (HCC)   OSA on CPAP   Recurrent V.Tach /  s/p AICD shock: Patient with history of recurrent VT reports AICD firing, admitted after shock from device. Patient denies any chest pain.  EKG showed normal sinus rhythm. EP physician consulted.  Continue on amiodarone gtt. TSH abnormal. Dose adjusted. Cardiologist states patient will continue to have episodes of V. tach until primary infection resolves, I concur   Ileus status post gastric surgery: SEPSIS secondary to intra-abdominal abscess, POA. S/P hand-assisted laparoscopic right colectomy on 04/18/22. Had post operative ileus during last hospital stay. Sepsis criteria met at intake: Leukocytosis, known source, tachycardia General  surgery has been consulted. 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 Held eliquis/plavix until IR intervention. Patient underwent successful percutaneous drain, and embolization of the splenic artery. Initiated on Zosyn and then transitioned to Unasyn. Continue to have high white cell count despite being on antibiotic. Repeat imaging shows persistent but decreasing size of abscess. IR recommended repositioning/exchange of existing percutaneous drain. Continue to decrease oxycodone and Dilaudid dosing and rates as appropriate   Leukocytosis: Likely secondary to intra-abdominal abscess. Lactic acid 1.7. WBC trending down but still elevated. Continued on IV Zosyn.  Cultures growing E. coli and strep anginosus sensitive to Unasyn. Antibiotic changed with IV Unasyn.  Continue to monitor WBC. 05/15/2022: Gradually improving.  Continue to monitor.  Continue antibiotics.   Left upper extremity DVT, acute, POA: -Venous Doppler ultrasound revealed DVT of the left brachial vein. -Patient has history of recent metastatic colon cancer. -Patient will be started on IV heparin.  Hypothyroidism:  Free T4 wnl Would repeat levels in 1-2 weeks outpatient once acute illness has subsided   Cancer of right colon [facility]) Recently diagnosed at previous hospitalization. 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  He has follow up with oncology outpatient for PET scan - plan to discuss treatment options at that time.   Chronic combined systolic and diastolic CHF: Echo 3/73/4287: LVEF 35-40%, worse compared to 09/22/2020 (LVEF 40-45%).  Clinically euvolemic.  Continue farxiga Metoprolol XL and Entresto is on hold with soft bp/hypotension.   Paroxysmal atrial fibrillation (HCC) Patient remains in normal sinus  rhythm now. Continue amiodarone IV for now,  change to p.o. once acute infection improves or controlled. Eliquis resumed  but discontinued again, resume once IR prodeure completed.   CAD S/P percutaneous coronary angioplasty S/p LHC on 04/17/22 recommendations:  Anatomy is similar to prior with the following exceptions: 50% to 70% stenosis proximal to the large first obtuse marginal.  40 to 50% stenosis proximal to the stent segment in the first diagonal. He denies any chest pain, troponin mildly elevated. Continue medical management.  Resume Plavix tomorrow.   Type 2 diabetes without complication. Hb A1c 5.1.  Well-controlled. Continue farxiga for CHF SSI and accuchecks per protocol.    Essential hypertension Soft bp, systolic <956.   Hold entrestro, toprol-xl and lasix    Seizures (HCC) Continue keppra. Seizure precautions    OSA on CPAP Uses cpap at night  Continue qhs.   Left upper extremity and lower extremity swelling: Venous duplex negative for DVT Upper extremity positive for DVT (left brachial vein) Continues on heparin drip, transition to full dose anticoagulation at discharge pending further need for procedure or surgical intervention we will hold off on DOAC at this time   DVT prophylaxis: Heparin drip Code Status: Full Family Communication: None present  Status is: Inpatient  Dispo: The patient is from: Home              Anticipated d/c is to: To be determined              Anticipated d/c date is: 48 to 72 hours pending clinical course              Patient currently not medically stable for discharge  Consultants:  Cardiology, general surgery  Antimicrobials:  Unasyn  Subjective: No acute issues or events overnight denies nausea vomiting diarrhea constipation any fevers chills or chest pain  Objective: Vitals:   05/15/22 0418 05/15/22 2027 05/16/22 0343 05/16/22 0734  BP: 131/87 120/79 130/81   Pulse: 68 72 68 69  Resp: _0 Temp: (!) 97 F  (36.1 C) 98.1 F (36.7 C) (!) 97.4 F (36.3 C) (!) 97.5 F (36.4 C)  TempSrc: Axillary Oral Oral Oral  SpO2: 100% 100%  98%  Weight:      Height:        Intake/Output Summary (Last 24 hours) at 05/16/2022 0745 Last data filed at 05/16/2022 0522 Gross per 24 hour  Intake 2564.07 ml  Output 1245 ml  Net 1319.07 ml   Filed Weights   05/08/22 0841 05/08/22 1626  Weight: 95.7 kg 102.1 kg    Examination:  General:  Pleasantly resting in bed, No acute distress. Lungs:  Clear to auscultate bilaterally without rhonchi, wheeze, or rales. Heart:  Regular rate and rhythm.  Without murmurs, rubs, or gallops. Abdomen:  Soft, nontender, nondistended.  Without guarding or rebound. Extremities: Without cyanosis, clubbing, left upper extremity edema, 1+  Data Reviewed: I have personally reviewed following labs and imaging studies  CBC: Recent Labs  Lab 05/12/22 0116 05/13/22 0705 05/14/22 0206 05/15/22 0229 05/16/22 0619  WBC 22.1* 17.7* 22.5* 14.0* 14.1*  HGB 10.4* 10.3* 9.8* 10.2* 9.6*  HCT 34.8* 35.2* 32.4* 35.9* 31.6*  MCV 82.7 82.8 82.9 87.8 83.4  PLT 349 334 323 327 213   Basic Metabolic Panel: Recent Labs  Lab 05/10/22 0315 05/10/22 0636 05/11/22 0251 05/12/22 0116 05/13/22 0705 05/14/22 0206 05/15/22 0229 05/15/22 0730  NA 128*  --  129* 131* 130* 130* 133*  --   K 5.2*   < > 4.7 4.4 4.1 4.4 4.2  --   CL 98  --  99 98 100 99 102  --   CO2 19*  --  _0 --   GLUCOSE 119*  --  127* 108* 131* 109* 135*  --   BUN 22  --  15 9 7* 5* 6*  --   CREATININE 0.86  --  0.73 0.71 0.84 0.74 0.65  --   CALCIUM 7.8*  --  7.7* 7.8* 7.6* 7.7* 7.5*  --   MG 2.1  --  2.1  --  2.1 2.1  --  2.1  PHOS 4.3  --  2.7  --   --  2.3* 2.7  --    < > = values in this interval not displayed.   GFR: Estimated Creatinine Clearance: 122.2 mL/min (by C-G formula based on SCr of 0.65 mg/dL). Liver Function Tests: Recent Labs  Lab 05/11/22 0251 05/12/22 0116 05/13/22 0705  05/14/22 0206 05/15/22 0229  AST 70* 80* 70* 69* 50*  ALT 44 52* 56* 55* 47*  ALKPHOS 156* 174* 174* 164* 138*  BILITOT 0.5 0.3 0.1* 0.2* 0.2*  PROT 5.2* 5.4* 5.5* 5.6* 5.4*  ALBUMIN <1.5* <1.5* <1.5* <1.5* <1.5*   No results for input(s): LIPASE, AMYLASE in the last 168 hours. No results for input(s): AMMONIA in the last 168 hours. Coagulation Profile: Recent Labs  Lab 05/09/22 1325  INR 1.6*   Cardiac Enzymes: No results for input(s): CKTOTAL, CKMB, CKMBINDEX, TROPONINI in the last 168 hours. BNP (last 3 results) No results for input(s): PROBNP in the last 8760 hours. HbA1C: No results for input(s): HGBA1C in the last 72 hours. CBG: Recent Labs  Lab 05/15/22 0741 05/15/22 1314 05/15/22 1537 05/15/22 2145 05/16/22 0731  GLUCAP 119* 167* 130* 97 114*   Lipid Profile: No results for input(s): CHOL, HDL, LDLCALC, TRIG, CHOLHDL, LDLDIRECT in the last 72 hours. Thyroid Function Tests: No results for input(s): TSH, T4TOTAL, FREET4, T3FREE, THYROIDAB in the last 72 hours. Anemia Panel: No results for input(s): VITAMINB12, FOLATE, FERRITIN, TIBC, IRON, RETICCTPCT in the last 72 hours. Sepsis Labs: No results for input(s): PROCALCITON, LATICACIDVEN in the last 168 hours.  Recent Results (from the past 240 hour(s))  Aerobic/Anaerobic Culture w Gram Stain (surgical/deep wound)     Status: None   Collection Time: 05/09/22  5:38 PM   Specimen: Abscess  Result Value Ref Range Status   Specimen Description ABSCESS  Final   Special Requests NONE  Final   Gram Stain   Final    ABUNDANT WBC PRESENT,BOTH PMN AND MONONUCLEAR FEW GRAM POSITIVE COCCI IN CLUSTERS FEW GRAM POSITIVE COCCI IN CHAINS RARE GRAM NEGATIVE RODS    Culture   Final    ABUNDANT STREPTOCOCCUS ANGINOSIS MODERATE ESCHERICHIA COLI ABUNDANT BACTEROIDES FRAGILIS BETA LACTAMASE POSITIVE Performed at [facility], [employer] [address], [place], [place] [address]    Report Status 05/13/2022 FINAL  Final    Organism ID, Bacteria ESCHERICHIA COLI  Final   Organism ID, Bacteria STREPTOCOCCUS ANGINOSIS  Final      Susceptibility   Escherichia coli - MIC*    AMPICILLIN 8 SENSITIVE Sensitive     CEFAZOLIN <=4 SENSITIVE Sensitive     CEFEPIME <=0.12 SENSITIVE Sensitive     CEFTAZIDIME <=1 SENSITIVE Sensitive     CEFTRIAXONE <=0.25 SENSITIVE Sensitive     CIPROFLOXACIN <=0.25 SENSITIVE Sensitive     GENTAMICIN <=1 SENSITIVE Sensitive     IMIPENEM <=0.25 SENSITIVE Sensitive     TRIMETH/SULFA <=20 SENSITIVE Sensitive  AMPICILLIN/SULBACTAM <=2 SENSITIVE Sensitive     PIP/TAZO <=4 SENSITIVE Sensitive     * MODERATE ESCHERICHIA COLI   Streptococcus anginosis - MIC*    PENICILLIN <=0.06 SENSITIVE Sensitive     CEFTRIAXONE <=0.12 SENSITIVE Sensitive     ERYTHROMYCIN 4 RESISTANT Resistant     LEVOFLOXACIN 0.5 SENSITIVE Sensitive     VANCOMYCIN 0.5 SENSITIVE Sensitive     * ABUNDANT STREPTOCOCCUS ANGINOSIS         Radiology Studies: IR Catheter Tube Change  Result Date: 05/14/2022 CLINICAL DATA:  61 year old male with history of abdominal abscess status post percutaneous drain placement on 05/09/2022. Decreased drain output despite CT evidence of persistent large intra-abdominal fluid collection. EXAM: IR CATHETER TUBE CHANGE COMPARISON:  None Available. CONTRAST:  10 mL Omnipaque 300-administered via the percutaneous drainage catheter. MEDICATIONS: None. ANESTHESIA/SEDATION: Moderate (conscious) sedation was employed during this procedure. A total of Versed 2 mg and Fentanyl 100 mcg was administered intravenously. Moderate Sedation Time: 10 minutes. The patient's level of consciousness and vital signs were monitored continuously by radiology nursing throughout the procedure under my direct supervision. FLUOROSCOPY TIME:  1 minute, 30 mGy TECHNIQUE: Patient was positioned supine on the fluoroscopy table. The external portion of the existing percutaneous drainage catheter as well as the surrounding  skin was prepped and draped in usual sterile fashion. A preprocedural spot fluoroscopic image was obtained of the existing percutaneous drainage catheter. A small amount of contrast was injected via the existing percutaneous drainage catheter and several fluoroscopic images were obtained. The external portion of the percutaneous drainage catheter was cut and cannulated with a short Amplatz wire. Under intermittent fluoroscopic guidance, the existing percutaneous drainage catheter was repositioned and exchanged for a new 29 [place], biliary type percutaneous drainage catheter with end coiled and locked within the lower midline abdomen. Contrast injection confirmed appropriate position functionality of the percutaneous drainage catheter. Approximately 100 mL of opaque, yellow-green malodorous fluid was aspirated. The percutaneous drainage catheter was connected to a suction bulb and secured in place within interrupted suture and a StatLock device. A dressing was applied. The patient tolerated the procedure well without immediate postprocedural complication. FINDINGS: Indwelling 82 French catheter adjacent to the right lobe of the liver, inappropriately draining abdominal fluid collection. Successful drain repositioning to midline lower abdomen yielding approximately 100 mL purulent fluid. IMPRESSION: Successful drain exchange and repositioning for a 14 [place], biliary type drainage catheter positioned in the midline lower abdomen yielding approximately 100 mL of purulent fluid. PLAN: Keep to bulb suction.  Increase frequency in volume of flushing. [name], MD [facility] Electronically Signed   By: [name] M.D.   On: 05/14/2022 13:33   VAS [place] UPPER EXTREMITY VENOUS DUPLEX  Result Date: 05/15/2022 UPPER VENOUS STUDY  Patient Name:  ISLEY ZINNI  Date of Exam:   05/15/2022 Medical Rec #: [id number]                  Accession #:     [id number] Date of Birth: 1961/03/21                  Patient Gender: M Patient Age:   32 years Exam Location:  [facility] Procedure:      VAS [place] UPPER EXTREMITY VENOUS DUPLEX Referring Phys: Yehuda Savannah OGBATA --------------------------------------------------------------------------------  Indications: Pain, and Swelling Limitations: Edema, pain with compression, defibrillator in left chest. Comparison Study: Prior left upper extremity venous duplex done 04/21/22  indicating age indeterminate basilic vein thrombus Performing Technologist: Sharion Dove RVS  Examination Guidelines: A complete evaluation includes B-mode imaging, spectral Doppler, color Doppler, and power Doppler as needed of all accessible portions of each vessel. Bilateral testing is considered an integral part of a complete examination. Limited examinations for reoccurring indications may be performed as noted.  Right Findings: +----------+------------+---------+-----------+----------+-------+ RIGHT     CompressiblePhasicitySpontaneousPropertiesSummary +----------+------------+---------+-----------+----------+-------+ Subclavian               Yes       Yes                      +----------+------------+---------+-----------+----------+-------+  Left Findings: +----------+------------+---------+-----------+----------+-------------------+ LEFT      CompressiblePhasicitySpontaneousProperties      Summary       +----------+------------+---------+-----------+----------+-------------------+ IJV           Full       Yes       Yes                                  +----------+------------+---------+-----------+----------+-------------------+ Subclavian               Yes       Yes                                  +----------+------------+---------+-----------+----------+-------------------+ Axillary                 Yes       Yes                                   +----------+------------+---------+-----------+----------+-------------------+ Brachial      None       No        No               acute mid upper arm +----------+------------+---------+-----------+----------+-------------------+ Radial        Full                                                      +----------+------------+---------+-----------+----------+-------------------+ Ulnar         Full                                                      +----------+------------+---------+-----------+----------+-------------------+ Cephalic      Full                                                      +----------+------------+---------+-----------+----------+-------------------+ Basilic       Full                                                      +----------+------------+---------+-----------+----------+-------------------+  Summary:  Right: No evidence of thrombosis in the subclavian.  Left: No evidence of superficial vein thrombosis in the upper extremity. Findings consistent with acute deep vein thrombosis involving the left brachial veins above AC, coursing to the mid upper arm.. Findings for superficial vein thrombosis appear resolved from previous study.  *See table(s) above for measurements and observations.  Diagnosing physician: Orlie Pollen Electronically signed by Orlie Pollen on 05/15/2022 at 5:24:01 PM.    Final         Scheduled Meds:  atorvastatin  80 mg Oral QPM   dapagliflozin propanediol  10 mg Oral Daily   docusate sodium  100 mg Oral BID   ezetimibe  10 mg Oral Daily   feeding supplement  237 mL Oral BID BM   insulin aspart  0-9 Units Subcutaneous TID WC   levETIRAcetam  500 mg Oral BID   pantoprazole  40 mg Oral BID   phosphorus  250 mg Oral TID   polyethylene glycol  17 g Oral BID   sodium chloride flush  3 mL Intravenous Q12H   sodium chloride flush  5 mL Intracatheter Q8H   Continuous Infusions:  sodium chloride     amiodarone 30 mg/hr  (05/16/22 0418)   ampicillin-sulbactam (UNASYN) IV 3 g (05/16/22 0517)   heparin 1,700 Units/hr (05/16/22 0738)     LOS: 8 days   Time spent: 61mn  Shatina Streets C Christino Mcglinchey, DO [facility]  If 7PM-7AM, please contact night-coverage [email or web address]  05/16/2022, 7:45 AM

## 2022-05-16 NOTE — Progress Notes (Signed)
Chief Complaint/Subjective: Abdominal pain on left and right sides  Objective: Vital signs in last 24 hours: Temp:  [97.4 F (36.3 C)-98.1 F (36.7 C)] 97.5 F (36.4 C) (06/02 0734) Pulse Rate:  [68-72] 69 (06/02 0734) Resp:  [16-18] 18 (06/02 0343) BP: (120-130)/(79-81) 130/81 (06/02 0343) SpO2:  [98 %-100 %] 98 % (06/02 0734) Last BM Date : 05/13/22 Intake/Output from previous day: 06/01 0701 - 06/02 0700 In: 2564.1 [P.O.:1080; I.V.:523.3; IV Piggyback:960.8] Out: 1245 [Urine:900; Drains:345] Intake/Output this shift: No intake/output data recorded.  PE: Gen: NAD Resp: nonlabored Card: normal rate Abd: soft, slight distension, white liquid from drain, tender over left side  Lab Results:  Recent Labs    05/15/22 0229 05/16/22 0619  WBC 14.0* 14.1*  HGB 10.2* 9.6*  HCT 35.9* 31.6*  PLT 327 307   BMET Recent Labs    05/14/22 0206 05/15/22 0229  NA 130* 133*  K 4.4 4.2  CL 99 102  CO2 26 24  GLUCOSE 109* 135*  BUN 5* 6*  CREATININE 0.74 0.65  CALCIUM 7.7* 7.5*   PT/INR No results for input(s): LABPROT, INR in the last 72 hours. CMP     Component Value Date/Time   NA 133 (L) 05/15/2022 0229   NA 137 04/11/2022 1219   K 4.2 05/15/2022 0229   CL 102 05/15/2022 0229   CO2 24 05/15/2022 0229   GLUCOSE 135 (H) 05/15/2022 0229   BUN 6 (L) 05/15/2022 0229   BUN 13 04/11/2022 1219   CREATININE 0.65 05/15/2022 0229   CREATININE 1.08 12/12/2016 1445   CALCIUM 7.5 (L) 05/15/2022 0229   PROT 5.4 (L) 05/15/2022 0229   PROT 6.5 02/28/2022 1304   ALBUMIN <1.5 (L) 05/15/2022 0229   ALBUMIN 4.0 02/28/2022 1304   AST 50 (H) 05/15/2022 0229   ALT 47 (H) 05/15/2022 0229   ALKPHOS 138 (H) 05/15/2022 0229   BILITOT 0.2 (L) 05/15/2022 0229   BILITOT 0.2 02/28/2022 1304   GFRNONAA >60 05/15/2022 0229   GFRAA 91 11/15/2020 0959   Lipase     Component Value Date/Time   LIPASE 120 (H) 09/09/2020 1810    Studies/Results: IR Catheter Tube Change  Result  Date: 05/14/2022 CLINICAL DATA:  61 year old male with history of abdominal abscess status post percutaneous drain placement on 05/09/2022. Decreased drain output despite CT evidence of persistent large intra-abdominal fluid collection. EXAM: IR CATHETER TUBE CHANGE COMPARISON:  None Available. CONTRAST:  10 mL Omnipaque 300-administered via the percutaneous drainage catheter. MEDICATIONS: None. ANESTHESIA/SEDATION: Moderate (conscious) sedation was employed during this procedure. A total of Versed 2 mg and Fentanyl 100 mcg was administered intravenously. Moderate Sedation Time: 10 minutes. The patient's level of consciousness and vital signs were monitored continuously by radiology nursing throughout the procedure under my direct supervision. FLUOROSCOPY TIME:  1 minute, 30 mGy TECHNIQUE: Patient was positioned supine on the fluoroscopy table. The external portion of the existing percutaneous drainage catheter as well as the surrounding skin was prepped and draped in usual sterile fashion. A preprocedural spot fluoroscopic image was obtained of the existing percutaneous drainage catheter. A small amount of contrast was injected via the existing percutaneous drainage catheter and several fluoroscopic images were obtained. The external portion of the percutaneous drainage catheter was cut and cannulated with a short Amplatz wire. Under intermittent fluoroscopic guidance, the existing percutaneous drainage catheter was repositioned and exchanged for a new 84 [place], biliary type percutaneous drainage catheter with end coiled and locked within the lower  midline abdomen. Contrast injection confirmed appropriate position functionality of the percutaneous drainage catheter. Approximately 100 mL of opaque, yellow-green malodorous fluid was aspirated. The percutaneous drainage catheter was connected to a suction bulb and secured in place within interrupted suture and a StatLock device. A dressing was applied. The patient  tolerated the procedure well without immediate postprocedural complication. FINDINGS: Indwelling 33 French catheter adjacent to the right lobe of the liver, inappropriately draining abdominal fluid collection. Successful drain repositioning to midline lower abdomen yielding approximately 100 mL purulent fluid. IMPRESSION: Successful drain exchange and repositioning for a 14 [place], biliary type drainage catheter positioned in the midline lower abdomen yielding approximately 100 mL of purulent fluid. PLAN: Keep to bulb suction.  Increase frequency in volume of flushing. [name], MD [facility] Electronically Signed   By: [name] M.D.   On: 05/14/2022 13:33   VAS [place] UPPER EXTREMITY VENOUS DUPLEX  Result Date: 05/15/2022 UPPER VENOUS STUDY  Patient Name:  Gregory [name]  Date of Exam:   05/15/2022 Medical Rec #: [id number]                  Accession #:    [id number] Date of Birth: [birth year]                  Patient Gender: M Patient Age:   61 years Exam Location:  [facility] Procedure:      VAS [place] UPPER EXTREMITY VENOUS DUPLEX Referring Phys: Yehuda Savannah OGBATA --------------------------------------------------------------------------------  Indications: Pain, and Swelling Limitations: Edema, pain with compression, defibrillator in left chest. Comparison Study: Prior left upper extremity venous duplex done 04/21/22                   indicating age indeterminate basilic vein thrombus Performing Technologist: Sharion Dove RVS  Examination Guidelines: A complete evaluation includes B-mode imaging, spectral Doppler, color Doppler, and power Doppler as needed of all accessible portions of each vessel. Bilateral testing is considered an integral part of a complete examination. Limited examinations for reoccurring indications may be performed as noted.  Right Findings: +----------+------------+---------+-----------+----------+-------+  RIGHT     CompressiblePhasicitySpontaneousPropertiesSummary +----------+------------+---------+-----------+----------+-------+ Subclavian               Yes       Yes                      +----------+------------+---------+-----------+----------+-------+  Left Findings: +----------+------------+---------+-----------+----------+-------------------+ LEFT      CompressiblePhasicitySpontaneousProperties      Summary       +----------+------------+---------+-----------+----------+-------------------+ IJV           Full       Yes       Yes                                  +----------+------------+---------+-----------+----------+-------------------+ Subclavian               Yes       Yes                                  +----------+------------+---------+-----------+----------+-------------------+ Axillary                 Yes       Yes                                  +----------+------------+---------+-----------+----------+-------------------+  Brachial      None       No        No               acute mid upper arm +----------+------------+---------+-----------+----------+-------------------+ Radial        Full                                                      +----------+------------+---------+-----------+----------+-------------------+ Ulnar         Full                                                      +----------+------------+---------+-----------+----------+-------------------+ Cephalic      Full                                                      +----------+------------+---------+-----------+----------+-------------------+ Basilic       Full                                                      +----------+------------+---------+-----------+----------+-------------------+  Summary:  Right: No evidence of thrombosis in the subclavian.  Left: No evidence of superficial vein thrombosis in the upper extremity. Findings consistent with  acute deep vein thrombosis involving the left brachial veins above AC, coursing to the mid upper arm.. Findings for superficial vein thrombosis appear resolved from previous study.  *See table(s) above for measurements and observations.  Diagnosing physician: Orlie Pollen Electronically signed by Orlie Pollen on 05/15/2022 at 5:24:01 PM.    Final     Anti-infectives: Anti-infectives (From admission, onward)    Start     Dose/Rate Route Frequency Ordered Stop   05/13/22 1800  Ampicillin-Sulbactam (UNASYN) 3 g in sodium chloride 0.9 % 100 mL IVPB        3 g 200 mL/hr over 30 Minutes Intravenous Every 6 hours 05/13/22 1136     05/08/22 2200  piperacillin-tazobactam (ZOSYN) IVPB 3.375 g  Status:  Discontinued        3.375 g 12.5 mL/hr over 240 Minutes Intravenous Every 8 hours 05/08/22 2008 05/13/22 1136       Assessment/Plan POD#28 s/p lap assisted R colectomy for adenocarcinoma, Dr. 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 Has seen Dr. Burr Medico - CT 5/25 with large post-op abscess - IR drainage 5/26 with 2.7L out initially, 250cc documented for the last 24h, Culture growing E COLI and STREP ANGINOSIS >> narrowed abx to unasyn 5/30 - also concern for splenic artery PSA on CT, CTA 5/26 with concern for dissection at bifurcation of celiac as well as some extravasation from distal branch of splenic artery, he has had this embolized 5/26. H/h stable -repeat CT 5/29 with interval significant decrease in size of  the large abdominal and pelvic abscess with a moderately large collection of gas and fluid remaining >> s/p IR drain exchange and repositioning 5/31 -WBC stable today. Continue abx and drain.  -enema PRN   FEN - D3 diet, increase to miralax BID, add Ensure VTE - continue to hold plavix and eliquis,  ok for heparin gtt if needed ID - Zosyn 5/25>>5/30, unasyn 5/30>>   - Per TRH/Cards -  CAD with history of PCI to LAD Recurrent ventricular tachycardia status post AICD placement, three episodes last PM as long as 67m Cardiology was notified, expected given underlying illness and tolerating episodes.  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  This care required moderate level of medical decision making.   LFairchanceSurgery 05/16/2022, 8:11 AM Please see Amion for pager number during day hours 7:00am-4:30pm or 7:00am -11:30am on weekends

## 2022-05-16 NOTE — Progress Notes (Signed)
[place] for heparin (apixaban on hold) Indication: DVT, afib  No Known Allergies  Patient Measurements: Height: '6\' 6"'$  (198.1 cm) Weight: 102.1 kg (225 lb 1.4 oz) IBW/kg (Calculated) : 91.4 Heparin Dosing Weight: 102kg  Vital Signs: Temp: 97.7 F (36.5 C) (06/02 2036) Temp Source: Oral (06/02 2036) BP: 127/98 (06/02 2036) Pulse Rate: 75 (06/02 2036)  Labs: Recent Labs    05/14/22 0206 05/15/22 0229 05/15/22 2250 05/16/22 0619 05/16/22 2053  HGB 9.8* 10.2*  --  9.6*  --   HCT 32.4* 35.9*  --  31.6*  --   PLT 323 327  --  307  --   APTT  --   --  64* 55* 69*  HEPARINUNFRC  --   --  0.41 0.32  --   CREATININE 0.74 0.65  --   --   --      Estimated Creatinine Clearance: 122.2 mL/min (by C-G formula based on SCr of 0.65 mg/dL).   Medical History: Past Medical History:  Diagnosis Date   AICD (automatic cardioverter/defibrillator) present 2005   CAD (coronary artery disease) 12/01/2013   Chronic combined systolic and diastolic CHF, NYHA class 1 ([place]) 12/01/2013   Erectile dysfunction 12/01/2013   HTN (hypertension) 12/01/2013   Hyperlipidemia 12/01/2013   Ischemic cardiomyopathy 12/01/2013   Presence of permanent cardiac pacemaker    Sleep apnea     Medications:  Medications Prior to Admission  Medication Sig Dispense Refill Last Dose   acetaminophen (TYLENOL) 500 MG tablet Take 1,000 mg by mouth every 6 (six) hours as needed for moderate pain.   05/07/2022   amiodarone (PACERONE) 200 MG tablet Take 2 tablets (400 mg total) by mouth daily. 60 tablet 0 05/07/2022   apixaban (ELIQUIS) 5 MG TABS tablet TAKE 1 TABLET(5 MG) BY MOUTH TWICE DAILY (Patient taking differently: Take 5 mg by mouth 2 (two) times daily.) 180 tablet 1 05/07/2022 at 2000   atorvastatin (LIPITOR) 80 MG tablet TAKE ONE TABLET BY MOUTH EVERY EVENING (Patient taking differently: Take 80 mg by mouth every evening.) 90 tablet 3 05/07/2022   clopidogrel (PLAVIX) 75  MG tablet TAKE 1 TABLET(75 MG) BY MOUTH DAILY (Patient taking differently: Take 75 mg by mouth daily. TAKE 1 TABLET(75 MG) BY MOUTH DAILY) 90 tablet 2 05/07/2022   dapagliflozin propanediol (FARXIGA) 10 MG TABS tablet Take 1 tablet (10 mg total) by mouth daily. 90 tablet 3 05/07/2022   ezetimibe (ZETIA) 10 MG tablet Take 1 tablet (10 mg total) by mouth daily. 90 tablet 3 05/07/2022   furosemide (LASIX) 20 MG tablet Take 2 tablets (40 mg total) by mouth 3 (three) times a week. (Patient taking differently: Take 40 mg by mouth every Monday, Wednesday, and Friday.) 80 tablet 1 05/07/2022   levETIRAcetam (KEPPRA) 500 MG tablet TAKE 1 TABLET(500 MG) BY MOUTH TWICE DAILY (Patient taking differently: Take 500 mg by mouth 2 (two) times daily.) 60 tablet 3 05/07/2022   metoprolol succinate (TOPROL-XL) 25 MG 24 hr tablet Take one tablet, 25 mg, in the morning and 50 mg, two tablets, in the evening. (Patient taking differently: 25 mg. Per patient taking 25 mg in the morning and 25 mg at night) 135 tablet 3 05/07/2022   oxyCODONE (OXY IR/ROXICODONE) 5 MG immediate release tablet Take 1-2 tablets (5-10 mg total) by mouth every 8 (eight) hours as needed for breakthrough pain. 15 tablet 0 05/07/2022   potassium chloride (KLOR-CON M) 10 MEQ tablet Take 1 tablet (10 mEq  total) by mouth daily. 30 tablet 6 05/07/2022   sacubitril-valsartan (ENTRESTO) 49-51 MG Take 1 tablet by mouth 2 (two) times daily. 180 tablet 1 05/07/2022   pantoprazole (PROTONIX) 40 MG tablet TAKE 1 TABLET(40 MG) BY MOUTH DAILY (Patient taking differently: Take 40 mg by mouth daily. TAKE 1 TABLET(40 MG) BY MOUTH DAILY) 30 tablet 2    sildenafil (VIAGRA) 50 MG tablet Take 1 tablet (50 mg total) by mouth as needed for erectile dysfunction. 30 tablet 4    Scheduled:   atorvastatin  80 mg Oral QPM   dapagliflozin propanediol  10 mg Oral Daily   docusate sodium  100 mg Oral BID   ezetimibe  10 mg Oral Daily   feeding supplement  237 mL Oral BID BM   insulin  aspart  0-9 Units Subcutaneous TID WC   levETIRAcetam  500 mg Oral BID   pantoprazole  40 mg Oral BID   polyethylene glycol  17 g Oral BID   sodium chloride flush  3 mL Intravenous Q12H   sodium chloride flush  5 mL Intracatheter Q8H   Infusions:   sodium chloride     amiodarone 30 mg/hr (05/16/22 1641)   ampicillin-sulbactam (UNASYN) IV 3 g (05/16/22 1823)   heparin 1,850 Units/hr (05/16/22 1555)    Assessment: Pt was on apixaban prior to admission for AF. He was admitted for recurrent ICD shock and ileus. He is s/p colectomy for adenocarcinoma. Doppler showed brachial DVT today. Apixaban has been on hold and CCS is ok with heparin. We will use high rate and avoid bolus for now.   Heparin level at goal, however may be seeing some effects of recent apixaban. Aptt below goal after rate adjustment overnight.  No bleeding or infusion issues noted.   PTT came back therapeutic tonight. Cont same rate and check level in AM.   Goal of Therapy:  Heparin level 0.3-0.7 units/ml aPTT 66-102 secs Monitor platelets by anticoagulation protocol: Yes   Plan:  Cont heparin 1850 units/hr aPTT and heparin level in AM  Onnie Boer, PharmD, BCIDP, AAHIVP, CPP Infectious Disease Pharmacist 05/16/2022 9:40 PM

## 2022-05-16 NOTE — Progress Notes (Signed)
Occupational Therapy Treatment Patient Details Name: Francis Doenges MRN: [id number] DOB: 05-Feb-1961 Today's Date: 05/16/2022   History of present illness The pt is a 61 yo male presenting 5/25 after AICD firing x3 and pt experiencing SOB. 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 PMH includes: CAD, VT, OSA on CPAP, seizures, CHF, HLD, HTN, obesity, afib, PVCs, and metastatic colon cancer s/p colon resection 5/5.   OT comments  Patient received in supine and was apprehensive of participating due to patient stating he had heart palpitations earlier. Patient agreed to get to EOB and asked to use BSC but was unable to have a BM and returned to EOB. Patient performed UE strengthening exercises with min verbal cues to perform before returning to supine. Patient to continue to be followed by acute OT.    Recommendations for follow up therapy are one component of a multi-disciplinary discharge planning process, led by the attending physician.  Recommendations may be updated based on patient status, additional functional criteria and insurance authorization.    Follow Up Recommendations  No OT follow up    Assistance Recommended at Discharge Intermittent Supervision/Assistance  Patient can return home with the following  A little help with bathing/dressing/bathroom;Help with stairs or ramp for entrance;Assistance with cooking/housework;Assist for transportation   Equipment Recommendations  Other (comment);BSC/3in1 (RW)    Recommendations for Other Services      Precautions / Restrictions Precautions Precautions: Fall Precaution Comments: watch O2, sx lightheadedness, abdominal drain, watch HR (hx of PVCs and runs of vtach) Restrictions Weight Bearing Restrictions: No        Mobility Bed Mobility Overal bed mobility: Needs Assistance Bed Mobility: Supine to Sit, Sit to Supine     Supine to sit: Min assist Sit to supine: Min assist   General bed mobility comments: min assist to raise trunk to get to EOB and assistance with LEs to go back to supine    Transfers Overall transfer level: Needs assistance Equipment used: Rolling walker (2 wheels) Transfers: Sit to/from Stand Sit to Stand: From elevated surface, Min assist           General transfer comment: min assist to steady patient     Balance Overall balance assessment: Needs assistance Sitting-balance support: No upper extremity supported, Feet supported Sitting balance-Leahy Scale: Good     Standing balance support: Bilateral upper extremity supported, During functional activity Standing balance-Leahy Scale: Poor Standing balance comment: reliant on UE support                           ADL either performed or assessed with clinical judgement   ADL Overall ADL's : Needs assistance/impaired                         Toilet Transfer: Minimal assistance;Stand-pivot;BSC/3in1;Rolling walker (2 wheels) Toilet Transfer Details (indicate cue type and reason): transferred from EOB to [address] [employer]           General ADL Comments: performed toilet transfer but was unable to have BM    Extremity/Trunk Assessment              Vision       Perception     Praxis  Cognition Arousal/Alertness: Awake/alert Behavior During Therapy: WFL for tasks assessed/performed Overall Cognitive Status: Within Functional Limits for tasks assessed                                 General Comments: anxious over pain and heart palpatations        Exercises Exercises: General Upper Extremity General Exercises - Upper Extremity Shoulder ABduction: Strengthening, Both, 10 reps, Seated, Theraband Theraband Level (Shoulder Abduction): Level 1 (Yellow) Elbow  Flexion: Strengthening, Both, 10 reps, Seated, Theraband Theraband Level (Elbow Flexion): Level 1 (Yellow) Elbow Extension: Strengthening, Both, 10 reps, Seated, Theraband Theraband Level (Elbow Extension): Level 1 (Yellow)    Shoulder Instructions       General Comments      Pertinent Vitals/ Pain       Pain Assessment Pain Assessment: Faces Faces Pain Scale: Hurts little more Pain Location: abdomen Pain Descriptors / Indicators: Grimacing, Guarding, Sharp Pain Intervention(s): Limited activity within patient's tolerance, Monitored during session, Repositioned  Home Living                                          Prior Functioning/Environment              Frequency  Min 2X/week        Progress Toward Goals  OT Goals(current goals can now be found in the care plan section)  Progress towards OT goals: Progressing toward goals  Acute Rehab OT Goals Patient Stated Goal: go home OT Goal Formulation: With patient Time For Goal Achievement: 05/28/22 Potential to Achieve Goals: Good ADL Goals Pt Will Perform Lower Body Bathing: with modified independence;sit to/from stand;sitting/lateral leans Pt Will Perform Lower Body Dressing: with modified independence;sit to/from stand Pt Will Transfer to Toilet: with modified independence;ambulating Pt/caregiver will Perform Home Exercise Program: Increased strength;Both right and left upper extremity;With theraband;Independently;With written HEP provided Additional ADL Goal #1: Pt to increase standing tolerance > 10 min during ADLs/mobility to improve overall endurance  Plan Discharge plan remains appropriate    Co-evaluation                 AM-PAC OT "6 Clicks" Daily Activity     Outcome Measure   Help from another person eating meals?: None Help from another person taking care of personal grooming?: A Little Help from another person toileting, which includes using toliet, bedpan, or urinal?: A  Little Help from another person bathing (including washing, rinsing, drying)?: A Little Help from another person to put on and taking off regular upper body clothing?: A Little Help from another person to put on and taking off regular lower body clothing?: A Little 6 Click Score: 19    End of Session Equipment Utilized During Treatment: Rolling walker (2 wheels);Oxygen  OT Visit Diagnosis: Other abnormalities of gait and mobility (R26.89);Muscle weakness (generalized) (M62.81)   Activity Tolerance Patient tolerated treatment well   Patient Left in bed;with call bell/phone within reach;with bed alarm set   Nurse Communication Mobility status        Time: 3419-3790 OT Time Calculation (min): 30 min  Charges: OT General Charges $OT Visit: 1 Visit OT Treatments $Self Care/Home Management : 8-22 mins $Therapeutic Exercise: 8-22 mins  Lodema Hong, [place]  Pager [phone] Office [phone]   Trixie Dredge 05/16/2022, 2:20 PM

## 2022-05-17 DIAGNOSIS — I472 Ventricular tachycardia, unspecified: Secondary | ICD-10-CM | POA: Diagnosis not present

## 2022-05-17 LAB — GLUCOSE, CAPILLARY
Glucose-Capillary: 92 mg/dL (ref 70–99)
Glucose-Capillary: 105 mg/dL — ABNORMAL HIGH (ref 70–99)
Glucose-Capillary: 115 mg/dL — ABNORMAL HIGH (ref 70–99)
Glucose-Capillary: 136 mg/dL — ABNORMAL HIGH (ref 70–99)

## 2022-05-17 LAB — HEPARIN LEVEL (UNFRACTIONATED)
Heparin Unfractionated: 0.28 IU/mL — ABNORMAL LOW (ref 0.30–0.70)
Heparin Unfractionated: 0.31 IU/mL (ref 0.30–0.70)

## 2022-05-17 LAB — CBC
HCT: 30.6 % — ABNORMAL LOW (ref 39.0–52.0)
Hemoglobin: 9.1 g/dL — ABNORMAL LOW (ref 13.0–17.0)
MCH: 24.9 pg — ABNORMAL LOW (ref 26.0–34.0)
MCHC: 29.7 g/dL — ABNORMAL LOW (ref 30.0–36.0)
MCV: 83.6 fL (ref 80.0–100.0)
Platelets: 308 10*3/uL (ref 150–400)
RBC: 3.66 MIL/uL — ABNORMAL LOW (ref 4.22–5.81)
RDW: 22.8 % — ABNORMAL HIGH (ref 11.5–15.5)
WBC: 14.7 10*3/uL — ABNORMAL HIGH (ref 4.0–10.5)
nRBC: 0 % (ref 0.0–0.2)

## 2022-05-17 LAB — MAGNESIUM: Magnesium: 2 mg/dL (ref 1.7–2.4)

## 2022-05-17 LAB — BASIC METABOLIC PANEL
Anion gap: 6 (ref 5–15)
BUN: 7 mg/dL — ABNORMAL LOW (ref 8–23)
CO2: 28 mmol/L (ref 22–32)
Calcium: 7.7 mg/dL — ABNORMAL LOW (ref 8.9–10.3)
Chloride: 101 mmol/L (ref 98–111)
Creatinine, Ser: 0.68 mg/dL (ref 0.61–1.24)
GFR, Estimated: >60 mL/min (ref >60)
Glucose, Bld: 106 mg/dL — ABNORMAL HIGH (ref 70–99)
Potassium: 4.1 mmol/L (ref 3.5–5.1)
Sodium: 135 mmol/L (ref 135–145)

## 2022-05-17 LAB — APTT: aPTT: 65 seconds — ABNORMAL HIGH (ref 24–36)

## 2022-05-17 MED ORDER — AMIODARONE HCL IN DEXTROSE 360-4.14 MG/200ML-% IV SOLN: 30.0000 mg/h | INTRAVENOUS | Status: DC

## 2022-05-17 MED ORDER — AMIODARONE HCL 200 MG PO TABS
400.0000 mg | ORAL_TABLET | Freq: Every day | ORAL | Status: DC
  Administered 2022-05-17: 400 mg via ORAL
  Filled 2022-05-17: qty 2

## 2022-05-17 MED ORDER — AMIODARONE HCL IN DEXTROSE 360-4.14 MG/200ML-% IV SOLN
30.0000 mg/h | INTRAVENOUS | Status: DC
  Administered 2022-05-17 – 2022-05-20 (×6): 30 mg/h via INTRAVENOUS
  Filled 2022-05-17 (×6): qty 200

## 2022-05-17 NOTE — Progress Notes (Signed)
Referring Physician(s): Dr. Romana Juniper  Supervising Physician: Aletta Edouard  Patient Status:  Gregory [name]  Chief Complaint:  F/U Drain  Brief History:  Gregory [name] is a 61 y.o. male admitted 4/28-5/12/23 for partially obstructing colon mass.    He underwent biopsy with hand-assisted laparoscopic right colectomy 5/5 as well as cardiac catheterization.   He presented to the ED 05/09/22 abdominal pain with distention.    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  He underwent drain placement and selective coil embolization of splenic artery branches (x2) for treatment of pseudoaneurysm by Dr. Dwaine Gale on 05/09/22.    F/u CT AP with on 05/12/22 showed:  1. Interval significant decrease in size of the large abdominal and pelvic abscess with a moderately large collection of gas and fluid remaining. 2. Small amount of ascites. 3. Cholelithiasis and mild sludge in the gallbladder without evidence of cholecystitis. 4. Moderate-sized left pleural effusion, mildly increased. 5. Small right pleural effusion, unchanged. 6. Bilateral lower lobe atelectasis, increased on the left. 7. Diffuse subcutaneous edema.   IR consulted for possible additional drain placement by general surgery.    Procedure note 5/31 = 14 Fr MDP drain exchanged for 14 Fr biliary-type drain with multiple side holes, repositioned to mid abdomen from RUQ.  Approximately 100 mL of opaque yellow-green fluid aspirated immediately.  Placed to bulb suction   Subjective:  No complaints. Tells me nurse flushed drain not long ago and flushed easily.  Allergies: Patient has no known allergies.  Medications: Prior to Admission medications   Medication Sig Start Date End Date Taking? Authorizing Provider  acetaminophen (TYLENOL) 500 MG tablet Take  1,000 mg by mouth every 6 (six) hours as needed for moderate pain.   Yes [provider]  amiodarone (PACERONE) 200 MG tablet Take 2 tablets (400 mg total) by mouth daily. 04/24/22  Yes Patrecia Pour, MD  apixaban (ELIQUIS) 5 MG TABS tablet TAKE 1 TABLET(5 MG) BY MOUTH TWICE DAILY Patient taking differently: Take 5 mg by mouth 2 (two) times daily. 04/11/22  Yes Croitoru, [name], MD  atorvastatin (LIPITOR) 80 MG tablet TAKE ONE TABLET BY MOUTH EVERY EVENING Patient taking differently: Take 80 mg by mouth every evening. 05/05/22  Yes Croitoru, [name], MD  clopidogrel (PLAVIX) 75 MG tablet TAKE 1 TABLET(75 MG) BY MOUTH DAILY Patient taking differently: Take 75 mg by mouth daily. TAKE 1 TABLET(75 MG) BY MOUTH DAILY 10/02/21  Yes Croitoru, [name], MD  dapagliflozin propanediol (FARXIGA) 10 MG TABS tablet Take 1 tablet (10 mg total) by mouth daily. 05/08/22  Yes Croitoru, [name], MD  ezetimibe (ZETIA) 10 MG tablet Take 1 tablet (10 mg total) by mouth daily. 03/13/22  Yes Croitoru, [name], MD  furosemide (LASIX) 20 MG tablet Take 2 tablets (40 mg total) by mouth 3 (three) times a week. Patient taking differently: Take 40 mg by mouth every Monday, Wednesday, and Friday. 03/12/22  Yes [name], MD  levETIRAcetam (KEPPRA) 500 MG tablet TAKE 1 TABLET(500 MG) BY MOUTH TWICE DAILY Patient taking differently: Take 500 mg by mouth 2 (two) times daily. 04/28/22  Yes [name], MD  metoprolol succinate (TOPROL-XL) 25 MG 24 hr tablet Take one tablet, 25 mg, in the morning and 50 mg, two tablets, in the evening. Patient taking differently: 25 mg. Per patient taking 25 mg in the morning and 25 mg  at night 06/27/21  Yes Croitoru, [name], MD  oxyCODONE (OXY IR/ROXICODONE) 5 MG immediate release tablet Take 1-2 tablets (5-10 mg total) by mouth every 8 (eight) hours as needed for breakthrough pain. 05/05/22  Yes [name], MD  potassium chloride (KLOR-CON M) 10 MEQ tablet Take 1 tablet (10 mEq total) by mouth daily.  12/02/21  Yes Croitoru, [name], MD  sacubitril-valsartan (ENTRESTO) 49-51 MG Take 1 tablet by mouth 2 (two) times daily. 10/07/21  Yes Croitoru, [name], MD  pantoprazole (PROTONIX) 40 MG tablet TAKE 1 TABLET(40 MG) BY MOUTH DAILY Patient taking differently: Take 40 mg by mouth daily. TAKE 1 TABLET(40 MG) BY MOUTH DAILY 03/13/22   Croitoru, [name], MD  sildenafil (VIAGRA) 50 MG tablet Take 1 tablet (50 mg total) by mouth as needed for erectile dysfunction. 01/25/21   Shirley Friar, PA-C     Vital Signs: BP 129/85 (BP Location: Right Arm)   Pulse 72   Temp 97.8 F (36.6 C) (Oral)   Resp 18   Ht '6\' 6"'$  (1.981 m)   Wt 225 lb 1.4 oz (102.1 kg)   SpO2 90%   BMI 26.01 kg/m   Physical Exam Constitutional:      Appearance: Normal appearance.  HENT:     Head: Normocephalic and atraumatic.  Cardiovascular:     Rate and Rhythm: Normal rate.  Pulmonary:     Effort: Pulmonary effort is normal. No respiratory distress.  Skin:    General: Skin is warm and dry.  Neurological:     General: No focal deficit present.     Mental Status: He is alert and oriented to person, place, and time.  Psychiatric:        Mood and Affect: Mood normal.        Behavior: Behavior normal.        Thought Content: Thought content normal.        Judgment: Judgment normal.  Drain Location: RUQ Size: Fr size: 14 Fr Date of placement: 05/14/22  Currently to: Drain collection device: suction bulb 24 hour output:  Output by Drain (mL) 05/15/22 0701 - 05/15/22 1900 05/15/22 1901 - 05/16/22 0700 05/16/22 0701 - 05/16/22 1900 05/16/22 1901 - 05/17/22 0700 05/17/22 0701 - 05/17/22 0952  Closed System Drain Right RLQ  14 Fr. 225 120 130 100 80    Current examination: Flushes/aspirates easily.  Whitish purulent drainage in bulb Insertion site unremarkable. Suture and stat lock in place. Dressed appropriately.   Imaging: IR Catheter Tube Change  Result Date: 05/14/2022 CLINICAL DATA:  61 year old male with  history of abdominal abscess status post percutaneous drain placement on 05/09/2022. Decreased drain output despite CT evidence of persistent large intra-abdominal fluid collection. EXAM: IR CATHETER TUBE CHANGE COMPARISON:  None Available. CONTRAST:  10 mL Omnipaque 300-administered via the percutaneous drainage catheter. MEDICATIONS: None. ANESTHESIA/SEDATION: Moderate (conscious) sedation was employed during this procedure. A total of Versed 2 mg and Fentanyl 100 mcg was administered intravenously. Moderate Sedation Time: 10 minutes. The patient's level of consciousness and vital signs were monitored continuously by radiology nursing throughout the procedure under my direct supervision. FLUOROSCOPY TIME:  1 minute, 30 mGy TECHNIQUE: Patient was positioned supine on the fluoroscopy table. The external portion of the existing percutaneous drainage catheter as well as the surrounding skin was prepped and draped in usual sterile fashion. A preprocedural spot fluoroscopic image was obtained of the existing percutaneous drainage catheter. A small amount of contrast was injected via the existing percutaneous drainage catheter and several  fluoroscopic images were obtained. The external portion of the percutaneous drainage catheter was cut and cannulated with a short Amplatz wire. Under intermittent fluoroscopic guidance, the existing percutaneous drainage catheter was repositioned and exchanged for a new 44 [place], biliary type percutaneous drainage catheter with end coiled and locked within the lower midline abdomen. Contrast injection confirmed appropriate position functionality of the percutaneous drainage catheter. Approximately 100 mL of opaque, yellow-green malodorous fluid was aspirated. The percutaneous drainage catheter was connected to a suction bulb and secured in place within interrupted suture and a StatLock device. A dressing was applied. The patient tolerated the procedure well without immediate  postprocedural complication. FINDINGS: Indwelling 7 French catheter adjacent to the right lobe of the liver, inappropriately draining abdominal fluid collection. Successful drain repositioning to midline lower abdomen yielding approximately 100 mL purulent fluid. IMPRESSION: Successful drain exchange and repositioning for a 14 [place], biliary type drainage catheter positioned in the midline lower abdomen yielding approximately 100 mL of purulent fluid. PLAN: Keep to bulb suction.  Increase frequency in volume of flushing. [name], MD [facility] Electronically Signed   By: [name] M.D.   On: 05/14/2022 13:33   VAS [place] UPPER EXTREMITY VENOUS DUPLEX  Result Date: 05/15/2022 UPPER VENOUS STUDY  Patient Name:  Gregory [name]  Date of Exam:   05/15/2022 Medical Rec #: [id number]                  Accession #:    [id number] Date of Birth: 05/22/1961                  Patient Gender: M Patient Age:   50 years Exam Location:  [facility] Procedure:      VAS [place] UPPER EXTREMITY VENOUS DUPLEX Referring Phys: Yehuda Savannah OGBATA --------------------------------------------------------------------------------  Indications: Pain, and Swelling Limitations: Edema, pain with compression, defibrillator in left chest. Comparison Study: Prior left upper extremity venous duplex done 04/21/22                   indicating age indeterminate basilic vein thrombus Performing Technologist: Sharion Dove RVS  Examination Guidelines: A complete evaluation includes B-mode imaging, spectral Doppler, color Doppler, and power Doppler as needed of all accessible portions of each vessel. Bilateral testing is considered an integral part of a complete examination. Limited examinations for reoccurring indications may be performed as noted.  Right Findings: +----------+------------+---------+-----------+----------+-------+ RIGHT      CompressiblePhasicitySpontaneousPropertiesSummary +----------+------------+---------+-----------+----------+-------+ Subclavian               Yes       Yes                      +----------+------------+---------+-----------+----------+-------+  Left Findings: +----------+------------+---------+-----------+----------+-------------------+ LEFT      CompressiblePhasicitySpontaneousProperties      Summary       +----------+------------+---------+-----------+----------+-------------------+ IJV           Full       Yes       Yes                                  +----------+------------+---------+-----------+----------+-------------------+ Subclavian               Yes       Yes                                  +----------+------------+---------+-----------+----------+-------------------+  Axillary                 Yes       Yes                                  +----------+------------+---------+-----------+----------+-------------------+ Brachial      None       No        No               acute mid upper arm +----------+------------+---------+-----------+----------+-------------------+ Radial        Full                                                      +----------+------------+---------+-----------+----------+-------------------+ Ulnar         Full                                                      +----------+------------+---------+-----------+----------+-------------------+ Cephalic      Full                                                      +----------+------------+---------+-----------+----------+-------------------+ Basilic       Full                                                      +----------+------------+---------+-----------+----------+-------------------+  Summary:  Right: No evidence of thrombosis in the subclavian.  Left: No evidence of superficial vein thrombosis in the upper extremity. Findings consistent with acute deep  vein thrombosis involving the left brachial veins above AC, coursing to the mid upper arm.. Findings for superficial vein thrombosis appear resolved from previous study.  *See table(s) above for measurements and observations.  Diagnosing physician: Orlie Pollen Electronically signed by Orlie Pollen on 05/15/2022 at 5:24:01 PM.    Final     Labs:  CBC: Recent Labs    05/14/22 0206 05/15/22 0229 05/16/22 0619 05/17/22 0708  WBC 22.5* 14.0* 14.1* 14.7*  HGB 9.8* 10.2* 9.6* 9.1*  HCT 32.4* 35.9* 31.6* 30.6*  PLT 323 327 307 308    COAGS: Recent Labs    05/08/22 1637 05/09/22 1325 05/15/22 2250 05/16/22 0619 05/16/22 2053 05/17/22 0708  INR 1.7* 1.6*  --   --   --   --   APTT  --   --  64* 55* 69* 65*    BMP: Recent Labs    05/13/22 0705 05/14/22 0206 05/15/22 0229 05/17/22 0708  NA 130* 130* 133* 135  K 4.1 4.4 4.2 4.1  CL 100 99 102 101  CO2 '27 26 24 28  '$ GLUCOSE 131* 109* 135* 106*  BUN 7* 5* 6* 7*  CALCIUM 7.6* 7.7* 7.5* 7.7*  CREATININE 0.84 0.74 0.65 0.68  GFRNONAA >60 >60 >60 >60    LIVER FUNCTION  TESTS: Recent Labs    05/12/22 0116 05/13/22 0705 05/14/22 0206 05/15/22 0229  BILITOT 0.3 0.1* 0.2* 0.2*  AST 80* 70* 69* 50*  ALT 52* 56* 55* 47*  ALKPHOS 174* 174* 164* 138*  PROT 5.4* 5.5* 5.6* 5.4*  ALBUMIN <1.5* <1.5* <1.5* <1.5*    Assessment and Plan:  History of abdominal abscess =status post percutaneous drain placement on 05/09/2022. Decreased drain output despite CT evidence of persistent large intra-abdominal fluid collection.  S/P 14 Fr MDP drain exchanged for 14 Fr biliary-type drain with multiple side holes, repositioned to mid abdomen from RUQ.   Continue flushes as ordered with 5 cc NS. Record output Q shift. Dressing changes QD or PRN if soiled.   Call IR APP or on call IR MD if difficulty flushing or sudden change in drain output.  Repeat imaging/possible drain injection once output < 10 mL/QD (excluding flush  material.)  Discharge planning: Please contact IR APP or on call IR MD prior to patient d/c to ensure appropriate follow up plans are in place. Typically patient will follow up with IR clinic 10-14 days post d/c for repeat imaging/possible drain injection. IR scheduler will contact patient with date/time of appointment. Patient will need to flush drain QD with 5 cc NS, record output QD, dressing changes every 2-3 days or earlier if soiled.   IR will continue to follow - please call with questions or concerns.  Electronically Signed: Murrell Redden, PA-C 05/17/2022, 9:45 AM    I spent a total of 15 Minutes at the the patient's bedside AND on the patient's hospital floor or unit, greater than 50% of which was counseling/coordinating care for drain follow up.

## 2022-05-17 NOTE — Progress Notes (Signed)
PROGRESS NOTE    Gregory [name]  JGO:[id number] DOB: [birth year] DOA: 05/08/2022 PCP: Patient, [name]   Brief Narrative:  61 years old male with PMH significant for CAD s/p stenting, chronic systolic heart failure, recurrent VT with ICD placement, atrial fibrillation, diabetes mellitus type 2, history of seizures, hypertension presented in the ED after his AICD gave him a shock.  Recent admission for partially obstructing abdominal mass s/p hemicolectomy(right) 04/18/22 - imaging here shows multiple abdominal lesions considered to be abscesses with pseudoaneurysm of the splenic artery. S/P perc drain and embolization respectively.  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  Active Problems:   Ileus following gastrointestinal surgery (HCC)   Hypothyroidism   Leukocytosis   Cancer of right colon (HCC)   Chronic combined systolic and diastolic CHF (congestive heart failure) (HCC)/ischemic cardiomyopathy   CAD S/P percutaneous coronary angioplasty   Paroxysmal atrial fibrillation (HCC)   Controlled type 2 diabetes mellitus without complication, without long-term current use of insulin (HCC)   Essential hypertension   Seizures (HCC)   OSA on CPAP   Recurrent V.Tach /  s/p AICD shock: Patient with history of recurrent VT reports AICD firing, admitted after shock from device. Patient denies any chest pain.  EKG showed normal sinus rhythm. EP following - appreciate insight/recs Continue on amiodarone gtt - transition to PO per cardiology/EP   Ileus status post gastric surgery: SEPSIS secondary to intra-abdominal abscess, POA. - S/P hand-assisted laparoscopic right colectomy on 04/18/22. Had post operative ileus during last hospital stay. - Sepsis criteria met at intake: Leukocytosis, known source, tachycardia - General surgery has been consulted. 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 - Held eliquis/plavix until IR intervention. - Patient underwent successful percutaneous drain, and embolization of the splenic artery. - Continue Unasyn - per surgical wound cultures (multispecies - Strep Anginosis, Ecoli) - Drain management per IR - Continue to decrease oxycodone and Dilaudid dosing and rates as appropriate   Left upper extremity DVT, acute, POA: -Venous Doppler ultrasound revealed DVT of the left brachial vein. -Patient has history of recent metastatic colon cancer. -On IV heparin.  Hypothyroidism:  Free T4 wnl Would repeat levels in 1-2 weeks outpatient once acute illness has subsided   Cancer of right colon [facility]) Recently diagnosed at previous hospitalization. 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  He has follow up with oncology outpatient for PET scan - plan to discuss treatment options at that time.   Chronic combined systolic and diastolic CHF: Echo 5/59/7416: LVEF 35-40%, slightly worse compared to 09/22/2020 (LVEF 40-45%).  Clinically euvolemic.  Continue farxiga Metoprolol XL and Entresto is on hold with soft bp/hypotension.   Paroxysmal atrial fibrillation (HCC) Patient remains in normal sinus rhythm now. Continue amiodarone IV for now,  change to p.o. once acute infection improves or controlled. Eliquis resumed but discontinued again, resume once IR prodeure completed.   CAD S/P percutaneous coronary angioplasty S/p LHC on 04/17/22 recommendations:  Anatomy is similar to prior with the following exceptions: 50% to 70% stenosis proximal to the large first obtuse marginal.  40 to 50% stenosis proximal to the stent segment in the first diagonal. He denies any chest pain, troponin mildly elevated. Continue medical management.  Resume Plavix tomorrow.   Type  2 diabetes without complication. Hb A1c  5.1.  Well-controlled. Continue farxiga for CHF SSI and accuchecks per protocol.    Essential hypertension Soft bp, systolic <209.   Hold entrestro, toprol-xl and lasix    Seizures (HCC) Continue keppra. Seizure precautions    OSA on CPAP Uses cpap at night  Continue qhs.   Left upper extremity and lower extremity swelling: Venous duplex negative for DVT Upper extremity positive for DVT (left brachial vein) Continues on heparin drip, transition to full dose anticoagulation at discharge pending further need for procedure or surgical intervention we will hold off on DOAC at this time   DVT prophylaxis: Heparin drip Code Status: Full Family Communication: None present  Status is: Inpatient  Dispo: The patient is from: Home              Anticipated d/c is to: To be determined              Anticipated d/c date is: 80 to 72 hours pending clinical course, need for further imaging, intervention and ability to control infection with current antibiotic regimen.              Patient currently not medically stable for discharge  Consultants:  Cardiology, general surgery  Antimicrobials:  Unasyn  Subjective: No acute issues or events overnight denies nausea vomiting diarrhea constipation any fevers chills or chest pain  Objective: Vitals:   05/16/22 0734 05/16/22 1524 05/16/22 2036 05/17/22 0510  BP:   (!) 127/98 (!) 143/94  Pulse: 69 72 75 74  Resp:   18 18  Temp: (!) 97.5 F (36.4 C) 97.8 F (36.6 C) 97.7 F (36.5 C) 97.6 F (36.4 C)  TempSrc: Oral Oral Oral Oral  SpO2: 98% 96% 97% 96%  Weight:      Height:        Intake/Output Summary (Last 24 hours) at 05/17/2022 0758 Last data filed at 05/17/2022 0727 Gross per 24 hour  Intake 305 ml  Output 1720 ml  Net -1415 ml    Filed Weights   05/08/22 0841 05/08/22 1626  Weight: 95.7 kg 102.1 kg    Examination:  General:  Pleasantly resting in bed, No acute distress. Lungs:   Clear to auscultate bilaterally without rhonchi, wheeze, or rales. Heart:  Regular rate and rhythm.  Without murmurs, rubs, or gallops. Abdomen:  Soft, nontender, nondistended.  Without guarding or rebound. Extremities: Without cyanosis, clubbing, left upper extremity edema, 1+  Data Reviewed: I have personally reviewed following labs and imaging studies  CBC: Recent Labs  Lab 05/12/22 0116 05/13/22 0705 05/14/22 0206 05/15/22 0229 05/16/22 0619  WBC 22.1* 17.7* 22.5* 14.0* 14.1*  HGB 10.4* 10.3* 9.8* 10.2* 9.6*  HCT 34.8* 35.2* 32.4* 35.9* 31.6*  MCV 82.7 82.8 82.9 87.8 83.4  PLT 349 334 323 327 470    Basic Metabolic Panel: Recent Labs  Lab 05/11/22 0251 05/12/22 0116 05/13/22 0705 05/14/22 0206 05/15/22 0229 05/15/22 0730  NA 129* 131* 130* 130* 133*  --   K 4.7 4.4 4.1 4.4 4.2  --   CL 99 98 100 99 102  --   CO2 _0 --   GLUCOSE 127* 108* 131* 109* 135*  --   BUN 15 9 7* 5* 6*  --   CREATININE 0.73 0.71 0.84 0.74 0.65  --   CALCIUM 7.7* 7.8* 7.6* 7.7* 7.5*  --   MG 2.1  --  2.1 2.1  --  2.1  PHOS 2.7  --   --  2.3* 2.7  --     GFR: Estimated Creatinine Clearance: 122.2 mL/min (by C-G formula based on SCr of 0.65 mg/dL). Liver Function Tests: Recent Labs  Lab 05/11/22 0251 05/12/22 0116 05/13/22 0705 05/14/22 0206 05/15/22 0229  AST 70* 80* 70* 69* 50*  ALT 44 52* 56* 55* 47*  ALKPHOS 156* 174* 174* 164* 138*  BILITOT 0.5 0.3 0.1* 0.2* 0.2*  PROT 5.2* 5.4* 5.5* 5.6* 5.4*  ALBUMIN <1.5* <1.5* <1.5* <1.5* <1.5*    No results for input(s): LIPASE, AMYLASE in the last 168 hours. No results for input(s): AMMONIA in the last 168 hours. Coagulation Profile: No results for input(s): INR, PROTIME in the last 168 hours.  Cardiac Enzymes: No results for input(s): CKTOTAL, CKMB, CKMBINDEX, TROPONINI in the last 168 hours. BNP (last 3 results) No results for input(s): PROBNP in the last 8760 hours. HbA1C: No results for input(s): HGBA1C in the  last 72 hours. CBG: Recent Labs  Lab 05/15/22 2145 05/16/22 0731 05/16/22 1200 05/16/22 1643 05/16/22 2141  GLUCAP 97 114* 100* 86 120*    Lipid Profile: No results for input(s): CHOL, HDL, LDLCALC, TRIG, CHOLHDL, LDLDIRECT in the last 72 hours. Thyroid Function Tests: No results for input(s): TSH, T4TOTAL, FREET4, T3FREE, THYROIDAB in the last 72 hours. Anemia Panel: No results for input(s): VITAMINB12, FOLATE, FERRITIN, TIBC, IRON, RETICCTPCT in the last 72 hours. Sepsis Labs: No results for input(s): PROCALCITON, LATICACIDVEN in the last 168 hours.  Recent Results (from the past 240 hour(s))  Aerobic/Anaerobic Culture w Gram Stain (surgical/deep wound)     Status: None   Collection Time: 05/09/22  5:38 PM   Specimen: Abscess  Result Value Ref Range Status   Specimen Description ABSCESS  Final   Special Requests NONE  Final   Gram Stain   Final    ABUNDANT WBC PRESENT,BOTH PMN AND MONONUCLEAR FEW GRAM POSITIVE COCCI IN CLUSTERS FEW GRAM POSITIVE COCCI IN CHAINS RARE GRAM NEGATIVE RODS    Culture   Final    ABUNDANT STREPTOCOCCUS ANGINOSIS MODERATE ESCHERICHIA COLI ABUNDANT BACTEROIDES FRAGILIS BETA LACTAMASE POSITIVE Performed at [facility], [place] [address] [place], [place] [address]    Report Status 05/13/2022 FINAL  Final   Organism ID, Bacteria ESCHERICHIA COLI  Final   Organism ID, Bacteria STREPTOCOCCUS ANGINOSIS  Final      Susceptibility   Escherichia coli - MIC*    AMPICILLIN 8 SENSITIVE Sensitive     CEFAZOLIN <=4 SENSITIVE Sensitive     CEFEPIME <=0.12 SENSITIVE Sensitive     CEFTAZIDIME <=1 SENSITIVE Sensitive     CEFTRIAXONE <=0.25 SENSITIVE Sensitive     CIPROFLOXACIN <=0.25 SENSITIVE Sensitive     GENTAMICIN <=1 SENSITIVE Sensitive     IMIPENEM <=0.25 SENSITIVE Sensitive     TRIMETH/SULFA <=20 SENSITIVE Sensitive     AMPICILLIN/SULBACTAM <=2 SENSITIVE Sensitive     PIP/TAZO <=4 SENSITIVE Sensitive     * MODERATE ESCHERICHIA COLI    Streptococcus anginosis - MIC*    PENICILLIN <=0.06 SENSITIVE Sensitive     CEFTRIAXONE <=0.12 SENSITIVE Sensitive     ERYTHROMYCIN 4 RESISTANT Resistant     LEVOFLOXACIN 0.5 SENSITIVE Sensitive     VANCOMYCIN 0.5 SENSITIVE Sensitive     * ABUNDANT STREPTOCOCCUS ANGINOSIS          Radiology Studies: VAS [place] UPPER EXTREMITY VENOUS DUPLEX  Result Date: 05/15/2022 UPPER VENOUS STUDY  Patient Name:  Gregory [name]  Date of Exam:   05/15/2022 Medical Rec #:  [id number]                  Accession #:    [id number] Date of Birth: [birth year]                  Patient Gender: M Patient Age:   44 years Exam Location:  [facility] Procedure:      VAS [place] UPPER EXTREMITY VENOUS DUPLEX Referring Phys: Yehuda Savannah OGBATA --------------------------------------------------------------------------------  Indications: Pain, and Swelling Limitations: Edema, pain with compression, defibrillator in left chest. Comparison Study: Prior left upper extremity venous duplex done 04/21/22                   indicating age indeterminate basilic vein thrombus Performing Technologist: Sharion Dove RVS  Examination Guidelines: A complete evaluation includes B-mode imaging, spectral Doppler, color Doppler, and power Doppler as needed of all accessible portions of each vessel. Bilateral testing is considered an integral part of a complete examination. Limited examinations for reoccurring indications may be performed as noted.  Right Findings: +----------+------------+---------+-----------+----------+-------+ RIGHT     CompressiblePhasicitySpontaneousPropertiesSummary +----------+------------+---------+-----------+----------+-------+ Subclavian               Yes       Yes                      +----------+------------+---------+-----------+----------+-------+  Left Findings: +----------+------------+---------+-----------+----------+-------------------+ LEFT      CompressiblePhasicitySpontaneousProperties       Summary       +----------+------------+---------+-----------+----------+-------------------+ IJV           Full       Yes       Yes                                  +----------+------------+---------+-----------+----------+-------------------+ Subclavian               Yes       Yes                                  +----------+------------+---------+-----------+----------+-------------------+ Axillary                 Yes       Yes                                  +----------+------------+---------+-----------+----------+-------------------+ Brachial      None       No        No               acute mid upper arm +----------+------------+---------+-----------+----------+-------------------+ Radial        Full                                                      +----------+------------+---------+-----------+----------+-------------------+ Ulnar         Full                                                      +----------+------------+---------+-----------+----------+-------------------+  Cephalic      Full                                                      +----------+------------+---------+-----------+----------+-------------------+ Basilic       Full                                                      +----------+------------+---------+-----------+----------+-------------------+  Summary:  Right: No evidence of thrombosis in the subclavian.  Left: No evidence of superficial vein thrombosis in the upper extremity. Findings consistent with acute deep vein thrombosis involving the left brachial veins above AC, coursing to the mid upper arm.. Findings for superficial vein thrombosis appear resolved from previous study.  *See table(s) above for measurements and observations.  Diagnosing physician: Orlie Pollen Electronically signed by Orlie Pollen on 05/15/2022 at 5:24:01 PM.    Final         Scheduled Meds:  atorvastatin  80 mg Oral QPM   dapagliflozin  propanediol  10 mg Oral Daily   docusate sodium  100 mg Oral BID   ezetimibe  10 mg Oral Daily   feeding supplement  237 mL Oral BID BM   insulin aspart  0-9 Units Subcutaneous TID WC   levETIRAcetam  500 mg Oral BID   pantoprazole  40 mg Oral BID   polyethylene glycol  17 g Oral BID   sodium chloride flush  3 mL Intravenous Q12H   sodium chloride flush  5 mL Intracatheter Q8H   Continuous Infusions:  sodium chloride 250 mL (05/17/22 0013)   amiodarone 30 mg/hr (05/17/22 0426)   ampicillin-sulbactam (UNASYN) IV 3 g (05/17/22 0526)   heparin 1,850 Units/hr (05/16/22 2339)     LOS: 9 days   Time spent: 18mn  Leanette Eutsler C Antonela Freiman, DO [facility]  If 7PM-7AM, please contact night-coverage [email or web address]  05/17/2022, 7:58 AM

## 2022-05-17 NOTE — Progress Notes (Signed)
Progress Note  Patient Name: Gregory [name] Date of Encounter: 05/17/2022  [facility] Cardiologist: Sanda Klein, MD   Subjective   NAEO. Tele shows no significant ventricular arrhythmias overnight.  Inpatient Medications    Scheduled Meds:  atorvastatin  80 mg Oral QPM   dapagliflozin propanediol  10 mg Oral Daily   docusate sodium  100 mg Oral BID   ezetimibe  10 mg Oral Daily   feeding supplement  237 mL Oral BID BM   insulin aspart  0-9 Units Subcutaneous TID WC   levETIRAcetam  500 mg Oral BID   pantoprazole  40 mg Oral BID   polyethylene glycol  17 g Oral BID   sodium chloride flush  3 mL Intravenous Q12H   sodium chloride flush  5 mL Intracatheter Q8H   Continuous Infusions:  sodium chloride 250 mL (05/17/22 0013)   amiodarone 30 mg/hr (05/17/22 0426)   ampicillin-sulbactam (UNASYN) IV 3 g (05/17/22 0526)   heparin 1,850 Units/hr (05/16/22 2339)   PRN Meds: sodium chloride, acetaminophen **OR** acetaminophen, bisacodyl, diphenhydrAMINE-zinc acetate, HYDROmorphone (DILAUDID) injection, oxyCODONE, sodium chloride flush, sodium phosphate   Vital Signs    Vitals:   05/16/22 1524 05/16/22 2036 05/17/22 0510 05/17/22 0804  BP:  (!) 127/98 (!) 143/94 129/85  Pulse: 72 75 74 72  Resp:  '18 18 18  '$ Temp: 97.8 F (36.6 C) 97.7 F (36.5 C) 97.6 F (36.4 C) 97.8 F (36.6 C)  TempSrc: Oral Oral Oral Oral  SpO2: 96% 97% 96% 90%  Weight:      Height:        Intake/Output Summary (Last 24 hours) at 05/17/2022 0923 Last data filed at 05/17/2022 0727 Gross per 24 hour  Intake 300 ml  Output 1650 ml  Net -1350 ml      05/08/2022    4:26 PM 05/08/2022    8:41 AM  Last 3 Weights  Weight (lbs) 225 lb 1.4 oz 211 lb  Weight (kg) 102.1 kg 95.709 kg      Telemetry    No VT.  Personally Reviewed  ECG     - Personally Reviewed  Physical Exam   GEN: No acute distress.   obese Eyes: significant bilateral lacrimal gland enlargement. Neck: No  JVD Cardiac: RRR, no murmurs, rubs, or gallops.  Respiratory: Clear to auscultation bilaterally. GI: Soft, nontender, non-distended  MS: No edema; No deformity. Neuro:  Nonfocal  Psych: Normal affect   Labs    High Sensitivity Troponin:   Recent Labs  Lab 05/08/22 0918 05/08/22 1101  TROPONINIHS 32* 30*     Chemistry Recent Labs  Lab 05/13/22 0705 05/14/22 0206 05/15/22 0229 05/15/22 0730 05/17/22 0708  NA 130* 130* 133*  --  135  K 4.1 4.4 4.2  --  4.1  CL 100 99 102  --  101  CO2 '27 26 24  '$ --  28  GLUCOSE 131* 109* 135*  --  106*  BUN 7* 5* 6*  --  7*  CREATININE 0.84 0.74 0.65  --  0.68  CALCIUM 7.6* 7.7* 7.5*  --  7.7*  MG 2.1 2.1  --  2.1 2.0  PROT 5.5* 5.6* 5.4*  --   --   ALBUMIN <1.5* <1.5* <1.5*  --   --   AST 70* 69* 50*  --   --   ALT 56* 55* 47*  --   --   ALKPHOS 174* 164* 138*  --   --   BILITOT 0.1*  0.2* 0.2*  --   --   GFRNONAA >60 >60 >60  --  >60  ANIONGAP 3* 5 7  --  6    Lipids No results for input(s): CHOL, TRIG, HDL, LABVLDL, LDLCALC, CHOLHDL in the last 168 hours.  Hematology Recent Labs  Lab 05/15/22 0229 05/16/22 0619 05/17/22 0708  WBC 14.0* 14.1* 14.7*  RBC 4.09* 3.79* 3.66*  HGB 10.2* 9.6* 9.1*  HCT 35.9* 31.6* 30.6*  MCV 87.8 83.4 83.6  MCH 24.9* 25.3* 24.9*  MCHC 28.4* 30.4 29.7*  RDW 22.9* 22.3* 22.8*  PLT 327 307 308   Thyroid No results for input(s): TSH, FREET4 in the last 168 hours.  BNPNo results for input(s): BNP, PROBNP in the last 168 hours.  DDimer No results for input(s): DDIMER in the last 168 hours.   Radiology    VAS [place] UPPER EXTREMITY VENOUS DUPLEX  Result Date: 05/15/2022 UPPER VENOUS STUDY  Patient Name:  Gregory [name]  Date of Exam:   05/15/2022 Medical Rec #: [id number]                  Accession #:    [id number] Date of Birth: 10-23-1961                  Patient Gender: M Patient Age:   61 years Exam Location:  [facility] Procedure:      VAS [place] UPPER EXTREMITY VENOUS DUPLEX Referring  Phys: Yehuda Savannah OGBATA --------------------------------------------------------------------------------  Indications: Pain, and Swelling Limitations: Edema, pain with compression, defibrillator in left chest. Comparison Study: Prior left upper extremity venous duplex done 04/21/22                   indicating age indeterminate basilic vein thrombus Performing Technologist: Sharion Dove RVS  Examination Guidelines: A complete evaluation includes B-mode imaging, spectral Doppler, color Doppler, and power Doppler as needed of all accessible portions of each vessel. Bilateral testing is considered an integral part of a complete examination. Limited examinations for reoccurring indications may be performed as noted.  Right Findings: +----------+------------+---------+-----------+----------+-------+ RIGHT     CompressiblePhasicitySpontaneousPropertiesSummary +----------+------------+---------+-----------+----------+-------+ Subclavian               Yes       Yes                      +----------+------------+---------+-----------+----------+-------+  Left Findings: +----------+------------+---------+-----------+----------+-------------------+ LEFT      CompressiblePhasicitySpontaneousProperties      Summary       +----------+------------+---------+-----------+----------+-------------------+ IJV           Full       Yes       Yes                                  +----------+------------+---------+-----------+----------+-------------------+ Subclavian               Yes       Yes                                  +----------+------------+---------+-----------+----------+-------------------+ Axillary                 Yes       Yes                                  +----------+------------+---------+-----------+----------+-------------------+  Brachial      None       No        No               acute mid upper arm  +----------+------------+---------+-----------+----------+-------------------+ Radial        Full                                                      +----------+------------+---------+-----------+----------+-------------------+ Ulnar         Full                                                      +----------+------------+---------+-----------+----------+-------------------+ Cephalic      Full                                                      +----------+------------+---------+-----------+----------+-------------------+ Basilic       Full                                                      +----------+------------+---------+-----------+----------+-------------------+  Summary:  Right: No evidence of thrombosis in the subclavian.  Left: No evidence of superficial vein thrombosis in the upper extremity. Findings consistent with acute deep vein thrombosis involving the left brachial veins above AC, coursing to the mid upper arm.. Findings for superficial vein thrombosis appear resolved from previous study.  *See table(s) above for measurements and observations.  Diagnosing physician: Orlie Pollen Electronically signed by Orlie Pollen on 05/15/2022 at 5:24:01 PM.    Final      Assessment & Plan    VT storm  No further sustained episodes.  Suspect this is secondary to history of ischemic heart disease although the degree of lacrimal enlargement raises a question of infiltrative disease such as sarcoid. Transition to oral amiodarone.   2. Abdominal abscess - Drain exchanged 05/14/2022  CT showed smaller but persistent abscess.    3. CAD  No s/s ischemia    4. Constipation  Per primary, improving   5. Acute LUE DVT On Heparin, eliquis on hold pending procedures.     For questions or updates, please contact [place] Please consult [email or web address] for contact info under        Signed, Vickie Epley, MD  05/17/2022, 9:23 AM

## 2022-05-17 NOTE — Progress Notes (Signed)
1438 Pt started having increased heart rate and went into v tach for about 30 seconds while moving from chair to bed. Pt states he became dizzy during that time. Pt has been okay since and symptoms have resolved. Cardiology made aware.

## 2022-05-17 NOTE — Progress Notes (Signed)
[place] for heparin (apixaban on hold) Indication: DVT, afib  No Known Allergies  Patient Measurements: Height: '6\' 6"'$  (198.1 cm) Weight: 102.1 kg (225 lb 1.4 oz) IBW/kg (Calculated) : 91.4 Heparin Dosing Weight: 102kg  Vital Signs: Temp: 97.9 F (36.6 C) (06/03 1527) Temp Source: Oral (06/03 1527) BP: 130/83 (06/03 1527) Pulse Rate: 78 (06/03 1400)  Labs: Recent Labs    05/15/22 0229 05/15/22 2250 05/16/22 0619 05/16/22 2053 05/17/22 0708 05/17/22 1744  HGB 10.2*  --  9.6*  --  9.1*  --   HCT 35.9*  --  31.6*  --  30.6*  --   PLT 327  --  307  --  308  --   APTT  --    < > 55* 69* 65*  --   HEPARINUNFRC  --    < > 0.32  --  0.28* 0.31  CREATININE 0.65  --   --   --  0.68  --    < > = values in this interval not displayed.     Estimated Creatinine Clearance: 122.2 mL/min (by C-G formula based on SCr of 0.68 mg/dL).   Medical History: Past Medical History:  Diagnosis Date   AICD (automatic cardioverter/defibrillator) present 2005   CAD (coronary artery disease) 12/01/2013   Chronic combined systolic and diastolic CHF, NYHA class 1 ([place]) 12/01/2013   Erectile dysfunction 12/01/2013   HTN (hypertension) 12/01/2013   Hyperlipidemia 12/01/2013   Ischemic cardiomyopathy 12/01/2013   Presence of permanent cardiac pacemaker    Sleep apnea     Medications:  Medications Prior to Admission  Medication Sig Dispense Refill Last Dose   acetaminophen (TYLENOL) 500 MG tablet Take 1,000 mg by mouth every 6 (six) hours as needed for moderate pain.   05/07/2022   amiodarone (PACERONE) 200 MG tablet Take 2 tablets (400 mg total) by mouth daily. 60 tablet 0 05/07/2022   apixaban (ELIQUIS) 5 MG TABS tablet TAKE 1 TABLET(5 MG) BY MOUTH TWICE DAILY (Patient taking differently: Take 5 mg by mouth 2 (two) times daily.) 180 tablet 1 05/07/2022 at 2000   atorvastatin (LIPITOR) 80 MG tablet TAKE ONE TABLET BY MOUTH EVERY EVENING (Patient taking  differently: Take 80 mg by mouth every evening.) 90 tablet 3 05/07/2022   clopidogrel (PLAVIX) 75 MG tablet TAKE 1 TABLET(75 MG) BY MOUTH DAILY (Patient taking differently: Take 75 mg by mouth daily. TAKE 1 TABLET(75 MG) BY MOUTH DAILY) 90 tablet 2 05/07/2022   dapagliflozin propanediol (FARXIGA) 10 MG TABS tablet Take 1 tablet (10 mg total) by mouth daily. 90 tablet 3 05/07/2022   ezetimibe (ZETIA) 10 MG tablet Take 1 tablet (10 mg total) by mouth daily. 90 tablet 3 05/07/2022   furosemide (LASIX) 20 MG tablet Take 2 tablets (40 mg total) by mouth 3 (three) times a week. (Patient taking differently: Take 40 mg by mouth every Monday, Wednesday, and Friday.) 80 tablet 1 05/07/2022   levETIRAcetam (KEPPRA) 500 MG tablet TAKE 1 TABLET(500 MG) BY MOUTH TWICE DAILY (Patient taking differently: Take 500 mg by mouth 2 (two) times daily.) 60 tablet 3 05/07/2022   metoprolol succinate (TOPROL-XL) 25 MG 24 hr tablet Take one tablet, 25 mg, in the morning and 50 mg, two tablets, in the evening. (Patient taking differently: 25 mg. Per patient taking 25 mg in the morning and 25 mg at night) 135 tablet 3 05/07/2022   oxyCODONE (OXY IR/ROXICODONE) 5 MG immediate release tablet Take 1-2 tablets (5-10  mg total) by mouth every 8 (eight) hours as needed for breakthrough pain. 15 tablet 0 05/07/2022   potassium chloride (KLOR-CON M) 10 MEQ tablet Take 1 tablet (10 mEq total) by mouth daily. 30 tablet 6 05/07/2022   sacubitril-valsartan (ENTRESTO) 49-51 MG Take 1 tablet by mouth 2 (two) times daily. 180 tablet 1 05/07/2022   pantoprazole (PROTONIX) 40 MG tablet TAKE 1 TABLET(40 MG) BY MOUTH DAILY (Patient taking differently: Take 40 mg by mouth daily. TAKE 1 TABLET(40 MG) BY MOUTH DAILY) 30 tablet 2    sildenafil (VIAGRA) 50 MG tablet Take 1 tablet (50 mg total) by mouth as needed for erectile dysfunction. 30 tablet 4    Scheduled:   atorvastatin  80 mg Oral QPM   dapagliflozin propanediol  10 mg Oral Daily   docusate sodium  100  mg Oral BID   ezetimibe  10 mg Oral Daily   feeding supplement  237 mL Oral BID BM   insulin aspart  0-9 Units Subcutaneous TID WC   levETIRAcetam  500 mg Oral BID   pantoprazole  40 mg Oral BID   polyethylene glycol  17 g Oral BID   sodium chloride flush  3 mL Intravenous Q12H   sodium chloride flush  5 mL Intracatheter Q8H   Infusions:   sodium chloride 250 mL (05/17/22 0013)   amiodarone 30 mg/hr (05/17/22 1729)   ampicillin-sulbactam (UNASYN) IV 3 g (05/17/22 1733)   heparin 2,050 Units/hr (05/17/22 1239)    Assessment: Pt was on apixaban prior to admission for AF. He was admitted for recurrent ICD shock and ileus. He is s/p colectomy for adenocarcinoma. Doppler showed brachial DVT today. Apixaban has been on hold (last dose 5/30) and CCS is ok with heparin. We will use high rate and avoid bolus for now.   Heparin level therapeutic at 0.31. Given close to subtherapeutic range, will increase infusion rate slightly.   Goal of Therapy:  Heparin level 0.3-0.7 units/ml aPTT 66-102 secs Monitor platelets by anticoagulation protocol: Yes   Plan:  Increase IV heparin gtt to 2100 units/hr Daily heparin level and CBC  Arrie Senate, PharmD, BCPS, [facility] Clinical Pharmacist [phone] Please check AMION for all [facility] Pharmacy numbers 05/17/2022

## 2022-05-17 NOTE — Progress Notes (Signed)
   Called to see patient regarding recurrent VT. Reviewed telemetry. Patient had about a 30 second episode of VT around 14:38 with rates around 150 bpm. Patient was transition from chair to bed at the time. He had associated dizziness with this. VT resolved after resting in bed and taking several deep breaths. Maintaining sinus rhythm since then. Rates currently in the 80s. I went to bedside and examined patient. Dizziness has resolved. No other symptoms at this time. IV Amiodarone was stopped this morning and he was switched to PO. Discussed with Dr. Quentin Ore - we will restart IV Amiodarone at '30mg'$ /hr.  Darreld Mclean, PA-C 05/17/2022 4:37 PM

## 2022-05-17 NOTE — Progress Notes (Signed)
[place] for heparin (apixaban on hold) Indication: DVT, afib  No Known Allergies  Patient Measurements: Height: '6\' 6"'$  (198.1 cm) Weight: 102.1 kg (225 lb 1.4 oz) IBW/kg (Calculated) : 91.4 Heparin Dosing Weight: 102kg  Vital Signs: Temp: 97.8 F (36.6 C) (06/03 0804) Temp Source: Oral (06/03 0804) BP: 129/85 (06/03 0804) Pulse Rate: 72 (06/03 0804)  Labs: Recent Labs    05/15/22 0229 05/15/22 2250 05/16/22 0619 05/16/22 2053 05/17/22 0708  HGB 10.2*  --  9.6*  --   --   HCT 35.9*  --  31.6*  --   --   PLT 327  --  307  --   --   APTT  --  64* 55* 69*  --   HEPARINUNFRC  --  0.41 0.32  --  0.28*  CREATININE 0.65  --   --   --  0.68     Estimated Creatinine Clearance: 122.2 mL/min (by C-G formula based on SCr of 0.68 mg/dL).   Medical History: Past Medical History:  Diagnosis Date   AICD (automatic cardioverter/defibrillator) present 2005   CAD (coronary artery disease) 12/01/2013   Chronic combined systolic and diastolic CHF, NYHA class 1 ([place]) 12/01/2013   Erectile dysfunction 12/01/2013   HTN (hypertension) 12/01/2013   Hyperlipidemia 12/01/2013   Ischemic cardiomyopathy 12/01/2013   Presence of permanent cardiac pacemaker    Sleep apnea     Medications:  Medications Prior to Admission  Medication Sig Dispense Refill Last Dose   acetaminophen (TYLENOL) 500 MG tablet Take 1,000 mg by mouth every 6 (six) hours as needed for moderate pain.   05/07/2022   amiodarone (PACERONE) 200 MG tablet Take 2 tablets (400 mg total) by mouth daily. 60 tablet 0 05/07/2022   apixaban (ELIQUIS) 5 MG TABS tablet TAKE 1 TABLET(5 MG) BY MOUTH TWICE DAILY (Patient taking differently: Take 5 mg by mouth 2 (two) times daily.) 180 tablet 1 05/07/2022 at 2000   atorvastatin (LIPITOR) 80 MG tablet TAKE ONE TABLET BY MOUTH EVERY EVENING (Patient taking differently: Take 80 mg by mouth every evening.) 90 tablet 3 05/07/2022   clopidogrel (PLAVIX) 75  MG tablet TAKE 1 TABLET(75 MG) BY MOUTH DAILY (Patient taking differently: Take 75 mg by mouth daily. TAKE 1 TABLET(75 MG) BY MOUTH DAILY) 90 tablet 2 05/07/2022   dapagliflozin propanediol (FARXIGA) 10 MG TABS tablet Take 1 tablet (10 mg total) by mouth daily. 90 tablet 3 05/07/2022   ezetimibe (ZETIA) 10 MG tablet Take 1 tablet (10 mg total) by mouth daily. 90 tablet 3 05/07/2022   furosemide (LASIX) 20 MG tablet Take 2 tablets (40 mg total) by mouth 3 (three) times a week. (Patient taking differently: Take 40 mg by mouth every Monday, Wednesday, and Friday.) 80 tablet 1 05/07/2022   levETIRAcetam (KEPPRA) 500 MG tablet TAKE 1 TABLET(500 MG) BY MOUTH TWICE DAILY (Patient taking differently: Take 500 mg by mouth 2 (two) times daily.) 60 tablet 3 05/07/2022   metoprolol succinate (TOPROL-XL) 25 MG 24 hr tablet Take one tablet, 25 mg, in the morning and 50 mg, two tablets, in the evening. (Patient taking differently: 25 mg. Per patient taking 25 mg in the morning and 25 mg at night) 135 tablet 3 05/07/2022   oxyCODONE (OXY IR/ROXICODONE) 5 MG immediate release tablet Take 1-2 tablets (5-10 mg total) by mouth every 8 (eight) hours as needed for breakthrough pain. 15 tablet 0 05/07/2022   potassium chloride (KLOR-CON M) 10 MEQ tablet Take  1 tablet (10 mEq total) by mouth daily. 30 tablet 6 05/07/2022   sacubitril-valsartan (ENTRESTO) 49-51 MG Take 1 tablet by mouth 2 (two) times daily. 180 tablet 1 05/07/2022   pantoprazole (PROTONIX) 40 MG tablet TAKE 1 TABLET(40 MG) BY MOUTH DAILY (Patient taking differently: Take 40 mg by mouth daily. TAKE 1 TABLET(40 MG) BY MOUTH DAILY) 30 tablet 2    sildenafil (VIAGRA) 50 MG tablet Take 1 tablet (50 mg total) by mouth as needed for erectile dysfunction. 30 tablet 4    Scheduled:   atorvastatin  80 mg Oral QPM   dapagliflozin propanediol  10 mg Oral Daily   docusate sodium  100 mg Oral BID   ezetimibe  10 mg Oral Daily   feeding supplement  237 mL Oral BID BM   insulin  aspart  0-9 Units Subcutaneous TID WC   levETIRAcetam  500 mg Oral BID   pantoprazole  40 mg Oral BID   polyethylene glycol  17 g Oral BID   sodium chloride flush  3 mL Intravenous Q12H   sodium chloride flush  5 mL Intracatheter Q8H   Infusions:   sodium chloride 250 mL (05/17/22 0013)   amiodarone 30 mg/hr (05/17/22 0426)   ampicillin-sulbactam (UNASYN) IV 3 g (05/17/22 0526)   heparin 1,850 Units/hr (05/16/22 2339)    Assessment: Pt was on apixaban prior to admission for AF. He was admitted for recurrent ICD shock and ileus. He is s/p colectomy for adenocarcinoma. Doppler showed brachial DVT today. Apixaban has been on hold (last dose 5/30) and CCS is ok with heparin. We will use high rate and avoid bolus for now.   aPTT this AM is 65 seconds and slightly below goal. Heparin level this AM is 0.28 and slightly below goal. Appears that both aPTT and Heparin level are now correlating, will now monitor with heparin level. Hgb is stable at 9 and platelets are within normal limits.  No signs of bleeding noted per RN.   Goal of Therapy:  Heparin level 0.3-0.7 units/ml aPTT 66-102 secs Monitor platelets by anticoagulation protocol: Yes   Plan:  Increase IV heparin gtt to 2050 units/hr 6h heparin level Daily heparin level and CBC Monitor for signs and symptoms of bleeding F/u plans for restarting oral anticoagulation when able  Thank you for involving pharmacy in this patient's care.  Elita Quick, PharmD PGY1 Ambulatory Care Pharmacy Resident 05/17/2022 8:16 AM  **Pharmacist phone directory can be found on [email or web address] listed under [place]**

## 2022-05-17 NOTE — Progress Notes (Signed)
Physical Therapy Treatment Patient Details Name: Gregory [name] MRN: [id number] DOB: Feb 23, 1961 Today's Date: 05/17/2022   History of Present Illness The pt is a 60 yo male presenting 5/25 after AICD firing x3 and pt experiencing SOB. 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 PMH includes: CAD, VT, OSA on CPAP, seizures, CHF, HLD, HTN, obesity, afib, PVCs, and metastatic colon cancer s/p colon resection 5/5.    PT Comments    Pt admitted with above diagnosis. Pt continues to make slow progress. Needs min assist overall for mobility for safety and is limited by dizziness and imbalance. Very poor endurance therefore recommend SNF for rehab prior to d/c home. Pt is in agreement as he realizes the help he is needing. Will continue PT.   Pt currently with functional limitations due to balance and endurance deficits. Pt will benefit from skilled PT to increase their independence and safety with mobility to allow discharge to the venue listed below.      Recommendations for follow up therapy are one component of a multi-disciplinary discharge planning process, led by the attending physician.  Recommendations may be updated based on patient status, additional functional criteria and insurance authorization.  Follow Up Recommendations  Skilled nursing-short term rehab (<3 hours/day) (Would benefit from SNF to get stronger as pt with poor endurance)     Assistance Recommended at Discharge Intermittent Supervision/Assistance  Patient can return home with the following A little help with walking and/or transfers;A little help with bathing/dressing/bathroom;Assistance with cooking/housework;Assist for transportation;Help with stairs or ramp for entrance   Equipment Recommendations   Rolling walker (2 wheels);BSC/3in1    Recommendations for Other Services       Precautions / Restrictions Precautions Precautions: Fall Precaution Comments: watch O2, sx lightheadedness, abdominal drain, watch HR (hx of PVCs and runs of vtach) Restrictions Weight Bearing Restrictions: No     Mobility  Bed Mobility Overal bed mobility: Needs Assistance Bed Mobility: Supine to Sit, Sit to Supine Rolling: Supervision Sidelying to sit: Min assist, HOB elevated Supine to sit: Min assist     General bed mobility comments: min assist to raise trunk to get to EOB    Transfers Overall transfer level: Needs assistance Equipment used: Rolling walker (2 wheels) Transfers: Sit to/from Stand Sit to Stand: From elevated surface, Min assist           General transfer comment: min assist to steady patient. Pt stood and pivoted to 3N1 intiially.  Total assit to clean as he had small BM.    Ambulation/Gait Ambulation/Gait assistance: Min assist Gait Distance (Feet): 35 Feet Assistive device: Rolling walker (2 wheels) Gait Pattern/deviations: Step-to pattern, Decreased stride length, Shuffle, Trunk flexed Gait velocity: decreased Gait velocity interpretation: <1.31 ft/sec, indicative of household ambulator   General Gait Details: Pt able to ambulate around bed with RW and min assist for steadying. Pt needs cues to stay close to rW.Pt also reports dizziness. VSS however.  Did have O2 removed with activity and sats >90%.   Stairs             Wheelchair Mobility    Modified Rankin (Stroke Patients Only)       Balance Overall balance assessment: Needs assistance Sitting-balance support: No upper extremity supported, Feet supported Sitting balance-Leahy Scale: Good  Standing balance support: Bilateral upper extremity supported, During functional activity Standing balance-Leahy Scale: Poor Standing balance comment: reliant on UE support and external support                             Cognition Arousal/Alertness: Awake/alert Behavior During Therapy: WFL for tasks assessed/performed Overall Cognitive Status: Within Functional Limits for tasks assessed                                 General Comments: anxious over pain and heart palpatations        Exercises General Exercises - Lower Extremity Ankle Circles/Pumps: AROM, Both, 10 reps, Supine Long Arc Quad: AROM, Both, 10 reps Hip ABduction/ADduction: AROM, Both, 10 reps (add, pillow squeeze) Hip Flexion/Marching: AROM, Both, 10 reps, Seated    General Comments        Pertinent Vitals/Pain Pain Assessment Pain Assessment: Faces Faces Pain Scale: Hurts little more Breathing: normal Negative Vocalization: none Facial Expression: smiling or inexpressive Body Language: relaxed Consolability: no need to console PAINAD Score: 0 Pain Location: abdomen Pain Descriptors / Indicators: Grimacing, Guarding, Sharp Pain Intervention(s): Limited activity within patient's tolerance, Monitored during session, Repositioned    Home Living                          Prior Function            PT Goals (current goals can now be found in the care plan section) Progress towards PT goals: Progressing toward goals    Frequency    Min 3X/week      PT Plan Discharge plan needs to be updated    Co-evaluation              AM-PAC PT "6 Clicks" Mobility   Outcome Measure  Help needed turning from your back to your side while in a flat bed without using bedrails?: A Little Help needed moving from lying on your back to sitting on the side of a flat bed without using bedrails?: A Little Help needed moving to and from a bed to a chair (including a wheelchair)?: A Lot Help needed standing up from a chair using your arms (e.g., wheelchair or bedside chair)?: A Lot Help needed to walk in hospital room?: A Little Help needed climbing 3-5 steps with a railing? :  Total 6 Click Score: 14    End of Session Equipment Utilized During Treatment: Oxygen;Gait belt Activity Tolerance: Patient limited by fatigue Patient left: with call bell/phone within reach;in chair;with chair alarm set Nurse Communication: Mobility status;Patient requests pain meds PT Visit Diagnosis: Other abnormalities of gait and mobility (R26.89);Muscle weakness (generalized) (M62.81)     Time: 7121-9758 PT Time Calculation (min) (ACUTE ONLY): 28 min  Charges:  $Gait Training: 8-22 mins $Therapeutic Exercise: 8-22 mins                     Shenandoah Yeats M,PT Acute Rehab Services [phone] (pager)    Alvira Philips 05/17/2022, 2:48 PM

## 2022-05-18 DIAGNOSIS — I472 Ventricular tachycardia, unspecified: Secondary | ICD-10-CM | POA: Diagnosis not present

## 2022-05-18 LAB — BASIC METABOLIC PANEL
Anion gap: 6 (ref 5–15)
BUN: 7 mg/dL — ABNORMAL LOW (ref 8–23)
CO2: 29 mmol/L (ref 22–32)
Calcium: 7.8 mg/dL — ABNORMAL LOW (ref 8.9–10.3)
Chloride: 100 mmol/L (ref 98–111)
Creatinine, Ser: 0.63 mg/dL (ref 0.61–1.24)
GFR, Estimated: >60 mL/min (ref >60)
Glucose, Bld: 104 mg/dL — ABNORMAL HIGH (ref 70–99)
Potassium: 4 mmol/L (ref 3.5–5.1)
Sodium: 135 mmol/L (ref 135–145)

## 2022-05-18 LAB — GLUCOSE, CAPILLARY
Glucose-Capillary: 103 mg/dL — ABNORMAL HIGH (ref 70–99)
Glucose-Capillary: 101 mg/dL — ABNORMAL HIGH (ref 70–99)
Glucose-Capillary: 130 mg/dL — ABNORMAL HIGH (ref 70–99)
Glucose-Capillary: 127 mg/dL — ABNORMAL HIGH (ref 70–99)

## 2022-05-18 LAB — CBC
HCT: 31.9 % — ABNORMAL LOW (ref 39.0–52.0)
Hemoglobin: 9.5 g/dL — ABNORMAL LOW (ref 13.0–17.0)
MCH: 24.6 pg — ABNORMAL LOW (ref 26.0–34.0)
MCHC: 29.8 g/dL — ABNORMAL LOW (ref 30.0–36.0)
MCV: 82.6 fL (ref 80.0–100.0)
Platelets: 287 10*3/uL (ref 150–400)
RBC: 3.86 MIL/uL — ABNORMAL LOW (ref 4.22–5.81)
RDW: 22.9 % — ABNORMAL HIGH (ref 11.5–15.5)
WBC: 12 10*3/uL — ABNORMAL HIGH (ref 4.0–10.5)
nRBC: 0 % (ref 0.0–0.2)

## 2022-05-18 LAB — HEPARIN LEVEL (UNFRACTIONATED): Heparin Unfractionated: 0.33 IU/mL (ref 0.30–0.70)

## 2022-05-18 LAB — MAGNESIUM: Magnesium: 2.1 mg/dL (ref 1.7–2.4)

## 2022-05-18 MED ORDER — MEXILETINE HCL 150 MG PO CAPS
150.0000 mg | ORAL_CAPSULE | Freq: Three times a day (TID) | ORAL | Status: DC
  Administered 2022-05-18 – 2022-05-22 (×14): 150 mg via ORAL
  Filled 2022-05-18 (×15): qty 1

## 2022-05-18 NOTE — Progress Notes (Signed)
[place] for heparin (apixaban on hold) Indication: DVT, afib  No Known Allergies  Patient Measurements: Height: '6\' 6"'$  (198.1 cm) Weight: 102.1 kg (225 lb 1.4 oz) IBW/kg (Calculated) : 91.4 Heparin Dosing Weight: 102kg  Vital Signs: Temp: 97.6 F (36.4 C) (06/04 0325) Temp Source: Oral (06/04 0325) BP: 137/93 (06/04 0325) Pulse Rate: 72 (06/04 0325)  Labs: Recent Labs    05/16/22 0619 05/16/22 2053 05/17/22 0708 05/17/22 1744 05/18/22 0317  HGB 9.6*  --  9.1*  --  9.5*  HCT 31.6*  --  30.6*  --  31.9*  PLT 307  --  308  --  287  APTT 55* 69* 65*  --   --   HEPARINUNFRC 0.32  --  0.28* 0.31 0.33  CREATININE  --   --  0.68  --  0.63     Estimated Creatinine Clearance: 122.2 mL/min (by C-G formula based on SCr of 0.63 mg/dL).   Medical History: Past Medical History:  Diagnosis Date   AICD (automatic cardioverter/defibrillator) present 2005   CAD (coronary artery disease) 12/01/2013   Chronic combined systolic and diastolic CHF, NYHA class 1 ([place]) 12/01/2013   Erectile dysfunction 12/01/2013   HTN (hypertension) 12/01/2013   Hyperlipidemia 12/01/2013   Ischemic cardiomyopathy 12/01/2013   Presence of permanent cardiac pacemaker    Sleep apnea     Medications:  Medications Prior to Admission  Medication Sig Dispense Refill Last Dose   acetaminophen (TYLENOL) 500 MG tablet Take 1,000 mg by mouth every 6 (six) hours as needed for moderate pain.   05/07/2022   amiodarone (PACERONE) 200 MG tablet Take 2 tablets (400 mg total) by mouth daily. 60 tablet 0 05/07/2022   apixaban (ELIQUIS) 5 MG TABS tablet TAKE 1 TABLET(5 MG) BY MOUTH TWICE DAILY (Patient taking differently: Take 5 mg by mouth 2 (two) times daily.) 180 tablet 1 05/07/2022 at 2000   atorvastatin (LIPITOR) 80 MG tablet TAKE ONE TABLET BY MOUTH EVERY EVENING (Patient taking differently: Take 80 mg by mouth every evening.) 90 tablet 3 05/07/2022   clopidogrel (PLAVIX) 75  MG tablet TAKE 1 TABLET(75 MG) BY MOUTH DAILY (Patient taking differently: Take 75 mg by mouth daily. TAKE 1 TABLET(75 MG) BY MOUTH DAILY) 90 tablet 2 05/07/2022   dapagliflozin propanediol (FARXIGA) 10 MG TABS tablet Take 1 tablet (10 mg total) by mouth daily. 90 tablet 3 05/07/2022   ezetimibe (ZETIA) 10 MG tablet Take 1 tablet (10 mg total) by mouth daily. 90 tablet 3 05/07/2022   furosemide (LASIX) 20 MG tablet Take 2 tablets (40 mg total) by mouth 3 (three) times a week. (Patient taking differently: Take 40 mg by mouth every Monday, Wednesday, and Friday.) 80 tablet 1 05/07/2022   levETIRAcetam (KEPPRA) 500 MG tablet TAKE 1 TABLET(500 MG) BY MOUTH TWICE DAILY (Patient taking differently: Take 500 mg by mouth 2 (two) times daily.) 60 tablet 3 05/07/2022   metoprolol succinate (TOPROL-XL) 25 MG 24 hr tablet Take one tablet, 25 mg, in the morning and 50 mg, two tablets, in the evening. (Patient taking differently: 25 mg. Per patient taking 25 mg in the morning and 25 mg at night) 135 tablet 3 05/07/2022   oxyCODONE (OXY IR/ROXICODONE) 5 MG immediate release tablet Take 1-2 tablets (5-10 mg total) by mouth every 8 (eight) hours as needed for breakthrough pain. 15 tablet 0 05/07/2022   potassium chloride (KLOR-CON M) 10 MEQ tablet Take 1 tablet (10 mEq total) by mouth daily.  30 tablet 6 05/07/2022   sacubitril-valsartan (ENTRESTO) 49-51 MG Take 1 tablet by mouth 2 (two) times daily. 180 tablet 1 05/07/2022   pantoprazole (PROTONIX) 40 MG tablet TAKE 1 TABLET(40 MG) BY MOUTH DAILY (Patient taking differently: Take 40 mg by mouth daily. TAKE 1 TABLET(40 MG) BY MOUTH DAILY) 30 tablet 2    sildenafil (VIAGRA) 50 MG tablet Take 1 tablet (50 mg total) by mouth as needed for erectile dysfunction. 30 tablet 4    Scheduled:   atorvastatin  80 mg Oral QPM   dapagliflozin propanediol  10 mg Oral Daily   docusate sodium  100 mg Oral BID   ezetimibe  10 mg Oral Daily   feeding supplement  237 mL Oral BID BM   insulin  aspart  0-9 Units Subcutaneous TID WC   levETIRAcetam  500 mg Oral BID   mexiletine  150 mg Oral Q8H   pantoprazole  40 mg Oral BID   polyethylene glycol  17 g Oral BID   sodium chloride flush  3 mL Intravenous Q12H   sodium chloride flush  5 mL Intracatheter Q8H   Infusions:   sodium chloride Stopped (05/18/22 0515)   amiodarone 30 mg/hr (05/18/22 0516)   ampicillin-sulbactam (UNASYN) IV 200 mL/hr at 05/18/22 0516   heparin 2,100 Units/hr (05/18/22 0516)    Assessment: Pt was on apixaban prior to admission for AF. He was admitted for recurrent ICD shock and ileus. He is s/p colectomy for adenocarcinoma. Doppler showed brachial DVT today. Apixaban has been on hold (last dose 5/30) and CCS is ok with heparin. We will use high rate and avoid bolus for now.   Heparin level 0.33 and within therapeutic goal on 2100 units/h. Hgb remains stable in the 9's, platelets are within normal limits. No signs of bleeding noted.    Goal of Therapy:  Heparin level 0.3-0.7 units/ml aPTT 66-102 secs Monitor platelets by anticoagulation protocol: Yes   Plan:  Continue IV heparin gtt to 2100 units/hr Daily heparin level and CBC Monitor for signs and symptoms of bleeding  Thank you for involving pharmacy in this patient's care.  Elita Quick, PharmD PGY1 Ambulatory Care Pharmacy Resident 05/18/2022 7:56 AM  **Pharmacist phone directory can be found on [email or web address] listed under [place]**

## 2022-05-18 NOTE — Progress Notes (Addendum)
CSW met with pt to discuss SNF recommendation. Pt explained that he was in CIR before and he wishes to return. CSW explained that typically PT would recommend CIR if they thought it was appropriate.Pt has asked to be re-evaluated. CSW asked about support at home and pt states that he lives with his son that works remotely. CSW will reach out to PT for another evaluation. CSW spoke with Thurmond Butts in PT and he stated that he would make a note of it for next session with PT.  TOC team will continue to assist with discharge planning needs.

## 2022-05-18 NOTE — Progress Notes (Addendum)
PROGRESS NOTE    Gregory [name]  NIO:[id number] DOB: 12-07-1961 DOA: 05/08/2022 PCP: Patient, [name]   Brief Narrative:  61 years old male with PMH significant for CAD s/p stenting, chronic systolic heart failure, recurrent VT with ICD placement, atrial fibrillation, diabetes mellitus type 2, history of seizures, hypertension presented in the ED after his AICD gave him a shock.  Recent admission for partially obstructing abdominal mass s/p hemicolectomy(right) 04/18/22 - imaging here shows multiple abdominal lesions considered to be abscesses with pseudoaneurysm of the splenic artery. S/P perc drain and embolization respectively.  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 - S/P hand-assisted laparoscopic right colectomy on 04/18/22. Had post operative ileus during last hospital stay. - Sepsis criteria met at intake: Leukocytosis, known source, tachycardia - General surgery has been consulted. 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 - Held eliquis/plavix until IR intervention. - Patient underwent successful percutaneous drain, and embolization of the splenic artery. - Continue Unasyn - per surgical wound cultures (multispecies - Strep Anginosis, Ecoli) - Drain management per IR - Continue to decrease  oxycodone and Dilaudid dosing and rates as appropriate  Recurrent V.Tach /s/p AICD shock: - Secondary to sepsis as above - Patient with history of recurrent VT reports AICD firing, admitted after shock from device. - Patient denies any chest pain.  EKG showed normal sinus rhythm. - EP following - appreciate insight/recs -Continues to have episodes of V. tach with exertion per nursing staff, amiodarone drip ongoing, cardiology attempting to wean patient to p.o. amiodarone   Left upper extremity DVT, acute, POA: -Venous Doppler ultrasound revealed DVT of the left brachial vein. -Patient has history of recent metastatic colon cancer. -On IV heparin.  Hypothyroidism:  - Free T4 wnl - Would repeat levels in 1-2 weeks outpatient once acute illness has subsided   Cancer of right colon ([place]) - Recently diagnosed at previous hospitalization. 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  - He has follow up with oncology outpatient for PET scan - plan to discuss treatment options at that time.   Chronic combined systolic and diastolic CHF: - Echo 08/01/2992: LVEF 35-40%, slightly worse compared to 09/22/2020 (LVEF 40-45%).  - Clinically euvolemic.  Continue farxiga - Metoprolol XL and Entresto is on hold with soft bp/hypotension.   Paroxysmal atrial fibrillation (HCC) - Patient remains in normal sinus rhythm now. - Continue amiodarone IV for now,  change to p.o. once acute infection improves or controlled. - Eliquis resumed but discontinued again, resume once IR prodeure completed.   CAD S/P percutaneous coronary angioplasty - S/p LHC on 04/17/22 recommendations:  Anatomy is similar to prior with the following exceptions: 50% to 70% stenosis proximal to the large first obtuse marginal.  40 to 50% stenosis proximal to the  stent segment in the first diagonal. - He denies any chest pain, troponin mildly elevated at intake likely 2/2 above ICD firing. - Continue medical management.  Resume Plavix tomorrow.   Type 2 diabetes without complication. - Hb W2N 5.1.  Well-controlled. - Continue farxiga for CHF - SSI and accuchecks per protocol.    Essential hypertension - Borderline hypotension at intake improving - Cardiology following - appreciate insight/recs - Continue to hold home entrestro, toprol-xl and lasix - no need for pressors   Seizures ([place]) Continue keppra. Seizure precautions    OSA on CPAP Uses cpap at night  Continue qhs.   Left upper extremity and lower extremity swelling: Venous duplex negative for DVT Upper extremity positive for DVT (left brachial vein) Continues on heparin drip, transition to full dose anticoagulation at discharge pending further need for procedure or surgical intervention we will hold off on DOAC at this time   DVT prophylaxis: Heparin drip Code Status: Full Family Communication: None present  Status is: Inpatient  Dispo: The patient is from: Home              Anticipated d/c is to: To be determined              Anticipated d/c date is: 37 to 72 hours pending clinical course, need for further imaging, intervention and ability to control infection with current antibiotic regimen.              Patient currently not medically stable for discharge  Consultants:  Cardiology, general surgery  Antimicrobials:  Unasyn  Subjective: No acute issues or events overnight denies nausea vomiting diarrhea constipation any fevers chills or chest pain  Objective: Vitals:   05/17/22 1400 05/17/22 1527 05/17/22 1948 05/18/22 0325  BP: (!) 134/93 130/83 134/87 (!) 137/93  Pulse: 78  72 72  Resp:   18 18  Temp:  97.9 F (36.6 C) 98 F (36.7 C) 97.6 F (36.4 C)  TempSrc:  Oral Oral Oral  SpO2: 97%  96% 98%  Weight:      Height:        Intake/Output Summary (Last  24 hours) at 05/18/2022 0800 Last data filed at 05/18/2022 0516 Gross per 24 hour  Intake 3617.66 ml  Output 1960 ml  Net 1657.66 ml    Filed Weights   05/08/22 0841 05/08/22 1626  Weight: 95.7 kg 102.1 kg    Examination:  General:  Pleasantly resting in bed, No acute distress. Lungs:  Clear to auscultate bilaterally without rhonchi, wheeze, or rales. Heart:  Regular rate and rhythm.  Without murmurs, rubs, or gallops. Abdomen:  Soft, nontender, nondistended.  Without guarding or rebound. Extremities: Without cyanosis, clubbing, left upper extremity edema, 1+  Data Reviewed: I have personally reviewed following labs and imaging studies  CBC: Recent Labs  Lab 05/14/22 0206 05/15/22 0229 05/16/22 0619 05/17/22 0708 05/18/22 0317  WBC 22.5* 14.0* 14.1* 14.7* 12.0*  HGB 9.8* 10.2* 9.6* 9.1* 9.5*  HCT 32.4* 35.9* 31.6* 30.6* 31.9*  MCV 82.9 87.8 83.4 83.6 82.6  PLT 323 327 307 308 562    Basic Metabolic Panel: Recent Labs  Lab 05/13/22 0705 05/14/22 0206 05/15/22 0229 05/15/22 0730 05/17/22 0708 05/18/22 0317  NA 130* 130* 133*  --  135 135  K 4.1 4.4 4.2  --  4.1 4.0  CL 100 99 102  --  101 100  CO2 $Re'27 26 24  'VxS$ --  28 29  GLUCOSE 131* 109* 135*  --  106*  104*  BUN 7* 5* 6*  --  7* 7*  CREATININE 0.84 0.74 0.65  --  0.68 0.63  CALCIUM 7.6* 7.7* 7.5*  --  7.7* 7.8*  MG 2.1 2.1  --  2.1 2.0 2.1  PHOS  --  2.3* 2.7  --   --   --     GFR: Estimated Creatinine Clearance: 122.2 mL/min (by C-G formula based on SCr of 0.63 mg/dL). Liver Function Tests: Recent Labs  Lab 05/12/22 0116 05/13/22 0705 05/14/22 0206 05/15/22 0229  AST 80* 70* 69* 50*  ALT 52* 56* 55* 47*  ALKPHOS 174* 174* 164* 138*  BILITOT 0.3 0.1* 0.2* 0.2*  PROT 5.4* 5.5* 5.6* 5.4*  ALBUMIN <1.5* <1.5* <1.5* <1.5*    No results for input(s): LIPASE, AMYLASE in the last 168 hours. No results for input(s): AMMONIA in the last 168 hours. Coagulation Profile: No results for input(s): INR, PROTIME  in the last 168 hours.  Cardiac Enzymes: No results for input(s): CKTOTAL, CKMB, CKMBINDEX, TROPONINI in the last 168 hours. BNP (last 3 results) No results for input(s): PROBNP in the last 8760 hours. HbA1C: No results for input(s): HGBA1C in the last 72 hours. CBG: Recent Labs  Lab 05/17/22 0759 05/17/22 1136 05/17/22 1638 05/17/22 2108 05/18/22 0738  GLUCAP 92 105* 115* 136* 103*    Lipid Profile: No results for input(s): CHOL, HDL, LDLCALC, TRIG, CHOLHDL, LDLDIRECT in the last 72 hours. Thyroid Function Tests: No results for input(s): TSH, T4TOTAL, FREET4, T3FREE, THYROIDAB in the last 72 hours. Anemia Panel: No results for input(s): VITAMINB12, FOLATE, FERRITIN, TIBC, IRON, RETICCTPCT in the last 72 hours. Sepsis Labs: No results for input(s): PROCALCITON, LATICACIDVEN in the last 168 hours.  Recent Results (from the past 240 hour(s))  Aerobic/Anaerobic Culture w Gram Stain (surgical/deep wound)     Status: None   Collection Time: 05/09/22  5:38 PM   Specimen: Abscess  Result Value Ref Range Status   Specimen Description ABSCESS  Final   Special Requests NONE  Final   Gram Stain   Final    ABUNDANT WBC PRESENT,BOTH PMN AND MONONUCLEAR FEW GRAM POSITIVE COCCI IN CLUSTERS FEW GRAM POSITIVE COCCI IN CHAINS RARE GRAM NEGATIVE RODS    Culture   Final    ABUNDANT STREPTOCOCCUS ANGINOSIS MODERATE ESCHERICHIA COLI ABUNDANT BACTEROIDES FRAGILIS BETA LACTAMASE POSITIVE Performed at [facility], [place] [address], [place], [place] [address]    Report Status 05/13/2022 FINAL  Final   Organism ID, Bacteria ESCHERICHIA COLI  Final   Organism ID, Bacteria STREPTOCOCCUS ANGINOSIS  Final      Susceptibility   Escherichia coli - MIC*    AMPICILLIN 8 SENSITIVE Sensitive     CEFAZOLIN <=4 SENSITIVE Sensitive     CEFEPIME <=0.12 SENSITIVE Sensitive     CEFTAZIDIME <=1 SENSITIVE Sensitive     CEFTRIAXONE <=0.25 SENSITIVE Sensitive     CIPROFLOXACIN <=0.25 SENSITIVE  Sensitive     GENTAMICIN <=1 SENSITIVE Sensitive     IMIPENEM <=0.25 SENSITIVE Sensitive     TRIMETH/SULFA <=20 SENSITIVE Sensitive     AMPICILLIN/SULBACTAM <=2 SENSITIVE Sensitive     PIP/TAZO <=4 SENSITIVE Sensitive     * MODERATE ESCHERICHIA COLI   Streptococcus anginosis - MIC*    PENICILLIN <=0.06 SENSITIVE Sensitive     CEFTRIAXONE <=0.12 SENSITIVE Sensitive     ERYTHROMYCIN 4 RESISTANT Resistant     LEVOFLOXACIN 0.5 SENSITIVE Sensitive     VANCOMYCIN 0.5 SENSITIVE Sensitive     *  ABUNDANT STREPTOCOCCUS ANGINOSIS          Radiology Studies: No results found.      Scheduled Meds:  atorvastatin  80 mg Oral QPM   dapagliflozin propanediol  10 mg Oral Daily   docusate sodium  100 mg Oral BID   ezetimibe  10 mg Oral Daily   feeding supplement  237 mL Oral BID BM   insulin aspart  0-9 Units Subcutaneous TID WC   levETIRAcetam  500 mg Oral BID   mexiletine  150 mg Oral Q8H   pantoprazole  40 mg Oral BID   polyethylene glycol  17 g Oral BID   sodium chloride flush  3 mL Intravenous Q12H   sodium chloride flush  5 mL Intracatheter Q8H   Continuous Infusions:  sodium chloride Stopped (05/18/22 0515)   amiodarone 30 mg/hr (05/18/22 0516)   ampicillin-sulbactam (UNASYN) IV 200 mL/hr at 05/18/22 0516   heparin 2,100 Units/hr (05/18/22 0516)     LOS: 10 days   Time spent: 79min  Donatella Walski C Nasario Czerniak, DO [facility]  If 7PM-7AM, please contact night-coverage [email or web address]  05/18/2022, 8:00 AM

## 2022-05-18 NOTE — Progress Notes (Deleted)
Mobility Specialist Progress Note:   05/18/22 1204  Mobility  Activity Refused mobility   Pt stated yesterday his heart rate shot up when he tried to ambulate with 1 person so he will not get up with only 1 person.  Will follow-up as time allows.   [facility] Khaleef Ruby Mobility Specialist

## 2022-05-18 NOTE — Progress Notes (Signed)
Mobility Specialist Progress Note:   05/18/22 1204  Mobility  Activity Contraindicated/medical hold   Pt requiring 3+ people d/t runs of vtach. Will follow-up as time allows.   [facility] Reana Chacko Mobility Specialist

## 2022-05-18 NOTE — Progress Notes (Signed)
Progress Note  Patient Name: Gregory [name] Date of Encounter: 05/18/2022  [facility] Cardiologist: Sanda Klein, MD   Subjective   NAEO. Recurrent VT yesterday afternoon after transitioned to oral amiodarone.  Inpatient Medications    Scheduled Meds:  atorvastatin  80 mg Oral QPM   dapagliflozin propanediol  10 mg Oral Daily   docusate sodium  100 mg Oral BID   ezetimibe  10 mg Oral Daily   feeding supplement  237 mL Oral BID BM   insulin aspart  0-9 Units Subcutaneous TID WC   levETIRAcetam  500 mg Oral BID   mexiletine  150 mg Oral Q8H   pantoprazole  40 mg Oral BID   polyethylene glycol  17 g Oral BID   sodium chloride flush  3 mL Intravenous Q12H   sodium chloride flush  5 mL Intracatheter Q8H   Continuous Infusions:  sodium chloride Stopped (05/18/22 0515)   amiodarone 30 mg/hr (05/18/22 0516)   ampicillin-sulbactam (UNASYN) IV 200 mL/hr at 05/18/22 0516   heparin 2,100 Units/hr (05/18/22 0516)   PRN Meds: sodium chloride, acetaminophen **OR** acetaminophen, bisacodyl, diphenhydrAMINE-zinc acetate, HYDROmorphone (DILAUDID) injection, oxyCODONE, sodium chloride flush, sodium phosphate   Vital Signs    Vitals:   05/17/22 1400 05/17/22 1527 05/17/22 1948 05/18/22 0325  BP: (!) 134/93 130/83 134/87 (!) 137/93  Pulse: 78  72 72  Resp:   18 18  Temp:  97.9 F (36.6 C) 98 F (36.7 C) 97.6 F (36.4 C)  TempSrc:  Oral Oral Oral  SpO2: 97%  96% 98%  Weight:      Height:        Intake/Output Summary (Last 24 hours) at 05/18/2022 0742 Last data filed at 05/18/2022 0516 Gross per 24 hour  Intake 3617.66 ml  Output 1960 ml  Net 1657.66 ml       05/08/2022    4:26 PM 05/08/2022    8:41 AM  Last 3 Weights  Weight (lbs) 225 lb 1.4 oz 211 lb  Weight (kg) 102.1 kg 95.709 kg      Telemetry    VT, monomorphic.  Personally Reviewed  ECG    Personally Reviewed  Physical Exam   GEN: No acute distress.   obese Eyes: significant bilateral  lacrimal gland enlargement. Neck: No JVD Cardiac: RRR, no murmurs, rubs, or gallops.  Respiratory: Clear to auscultation bilaterally. GI: Soft, nontender, non-distended  MS: No edema; No deformity. Neuro:  Nonfocal  Psych: Normal affect   Labs    High Sensitivity Troponin:   Recent Labs  Lab 05/08/22 0918 05/08/22 1101  TROPONINIHS 32* 30*      Chemistry Recent Labs  Lab 05/13/22 0705 05/14/22 0206 05/15/22 0229 05/15/22 0730 05/17/22 0708 05/18/22 0317  NA 130* 130* 133*  --  135 135  K 4.1 4.4 4.2  --  4.1 4.0  CL 100 99 102  --  101 100  CO2 '27 26 24  '$ --  28 29  GLUCOSE 131* 109* 135*  --  106* 104*  BUN 7* 5* 6*  --  7* 7*  CREATININE 0.84 0.74 0.65  --  0.68 0.63  CALCIUM 7.6* 7.7* 7.5*  --  7.7* 7.8*  MG 2.1 2.1  --  2.1 2.0 2.1  PROT 5.5* 5.6* 5.4*  --   --   --   ALBUMIN <1.5* <1.5* <1.5*  --   --   --   AST 70* 69* 50*  --   --   --  ALT 56* 55* 47*  --   --   --   ALKPHOS 174* 164* 138*  --   --   --   BILITOT 0.1* 0.2* 0.2*  --   --   --   GFRNONAA >60 >60 >60  --  >60 >60  ANIONGAP 3* 5 7  --  6 6     Lipids No results for input(s): CHOL, TRIG, HDL, LABVLDL, LDLCALC, CHOLHDL in the last 168 hours.  Hematology Recent Labs  Lab 05/16/22 0619 05/17/22 0708 05/18/22 0317  WBC 14.1* 14.7* 12.0*  RBC 3.79* 3.66* 3.86*  HGB 9.6* 9.1* 9.5*  HCT 31.6* 30.6* 31.9*  MCV 83.4 83.6 82.6  MCH 25.3* 24.9* 24.6*  MCHC 30.4 29.7* 29.8*  RDW 22.3* 22.8* 22.9*  PLT 307 308 287    Thyroid No results for input(s): TSH, FREET4 in the last 168 hours.  BNPNo results for input(s): BNP, PROBNP in the last 168 hours.  DDimer No results for input(s): DDIMER in the last 168 hours.   Radiology    No results found.   Assessment & Plan    VT storm  Recurrent VT after transitioning to oral amiodarone.; Suspect this is secondary to history of ischemic heart disease although the degree of lacrimal enlargement raises a question of infiltrative disease such as  sarcoid. Continue IV amiodarone. Start mexiletine. If quiescent, consider retrial of oral amiodarone tomorrow.   2. Abdominal abscess - Drain exchanged 05/14/2022  CT showed smaller but persistent abscess.    3. CAD  No s/s ischemia    4. Constipation  Per primary, improving   5. Acute LUE DVT On Heparin, eliquis on hold pending procedures.     For questions or updates, please contact [place] Please consult [email or web address] for contact info under        Signed, Vickie Epley, MD  05/18/2022, 7:42 AM

## 2022-05-19 DIAGNOSIS — I5042 Chronic combined systolic (congestive) and diastolic (congestive) heart failure: Secondary | ICD-10-CM

## 2022-05-19 DIAGNOSIS — I472 Ventricular tachycardia, unspecified: Secondary | ICD-10-CM | POA: Diagnosis not present

## 2022-05-19 LAB — BASIC METABOLIC PANEL
Anion gap: 3 — ABNORMAL LOW (ref 5–15)
BUN: 7 mg/dL — ABNORMAL LOW (ref 8–23)
CO2: 28 mmol/L (ref 22–32)
Calcium: 7.7 mg/dL — ABNORMAL LOW (ref 8.9–10.3)
Chloride: 104 mmol/L (ref 98–111)
Creatinine, Ser: 0.69 mg/dL (ref 0.61–1.24)
GFR, Estimated: >60 mL/min (ref >60)
Glucose, Bld: 91 mg/dL (ref 70–99)
Potassium: 3.9 mmol/L (ref 3.5–5.1)
Sodium: 135 mmol/L (ref 135–145)

## 2022-05-19 LAB — MAGNESIUM: Magnesium: 2.1 mg/dL (ref 1.7–2.4)

## 2022-05-19 LAB — CBC
HCT: 30.2 % — ABNORMAL LOW (ref 39.0–52.0)
Hemoglobin: 9.1 g/dL — ABNORMAL LOW (ref 13.0–17.0)
MCH: 25.1 pg — ABNORMAL LOW (ref 26.0–34.0)
MCHC: 30.1 g/dL (ref 30.0–36.0)
MCV: 83.2 fL (ref 80.0–100.0)
Platelets: 286 10*3/uL (ref 150–400)
RBC: 3.63 MIL/uL — ABNORMAL LOW (ref 4.22–5.81)
RDW: 22.8 % — ABNORMAL HIGH (ref 11.5–15.5)
WBC: 11.4 10*3/uL — ABNORMAL HIGH (ref 4.0–10.5)
nRBC: 0 % (ref 0.0–0.2)

## 2022-05-19 LAB — HEPARIN LEVEL (UNFRACTIONATED): Heparin Unfractionated: 0.39 IU/mL (ref 0.30–0.70)

## 2022-05-19 LAB — GLUCOSE, CAPILLARY
Glucose-Capillary: 87 mg/dL (ref 70–99)
Glucose-Capillary: 89 mg/dL (ref 70–99)
Glucose-Capillary: 89 mg/dL (ref 70–99)
Glucose-Capillary: 80 mg/dL (ref 70–99)
Glucose-Capillary: 135 mg/dL — ABNORMAL HIGH (ref 70–99)

## 2022-05-19 MED ORDER — FUROSEMIDE 40 MG PO TABS
40.0000 mg | ORAL_TABLET | Freq: Two times a day (BID) | ORAL | Status: DC
  Administered 2022-05-19 – 2022-05-22 (×6): 40 mg via ORAL
  Filled 2022-05-19 (×6): qty 1

## 2022-05-19 NOTE — Progress Notes (Signed)
  Inpatient Rehab Admissions Coordinator :  Per PT therapy change in recommendations, patient was screened for CIR candidacy by Danne Baxter RN MSN.  At this time patient appears to be a potential candidate for CIR, but OT recommends no follow up. I await OT reassessment. I will place a rehab consult per protocol for full assessment. Please call me with any questions.  Danne Baxter RN MSN Admissions Coordinator [phone]

## 2022-05-19 NOTE — Progress Notes (Signed)
PROGRESS NOTE    Gregory [name]  NIO:[id number] DOB: 12-07-1961 DOA: 05/08/2022 PCP: Patient, [name]   Brief Narrative:  61 years old male with PMH significant for CAD s/p stenting, chronic systolic heart failure, recurrent VT with ICD placement, atrial fibrillation, diabetes mellitus type 2, history of seizures, hypertension presented in the ED after his AICD gave him a shock.  Recent admission for partially obstructing abdominal mass s/p hemicolectomy(right) 04/18/22 - imaging here shows multiple abdominal lesions considered to be abscesses with pseudoaneurysm of the splenic artery. S/P perc drain and embolization respectively.  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 - S/P hand-assisted laparoscopic right colectomy on 04/18/22. Had post operative ileus during last hospital stay. - Sepsis criteria met at intake: Leukocytosis, known source, tachycardia - General surgery has been consulted. 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 - Held eliquis/plavix until IR intervention. - Patient underwent successful percutaneous drain, and embolization of the splenic artery. - Continue Unasyn - per surgical wound cultures (multispecies - Strep Anginosis, Ecoli) - Drain management per IR - Continue to decrease  oxycodone and Dilaudid dosing and rates as appropriate  Recurrent V.Tach /s/p AICD shock: - Secondary to sepsis as above - Patient with history of recurrent VT reports AICD firing, admitted after shock from device. - Patient denies any chest pain.  EKG showed normal sinus rhythm. - EP following - appreciate insight/recs -Continues to have episodes of V. tach with exertion per nursing staff, amiodarone drip ongoing, cardiology attempting to wean patient to p.o. amiodarone   Left upper extremity DVT, acute, POA: -Venous Doppler ultrasound revealed DVT of the left brachial vein. -Patient has history of recent metastatic colon cancer. -On IV heparin.  Hypothyroidism:  - Free T4 wnl - Would repeat levels in 1-2 weeks outpatient once acute illness has subsided   Cancer of right colon ([place]) - Recently diagnosed at previous hospitalization. 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  - He has follow up with oncology outpatient for PET scan - plan to discuss treatment options at that time.   Chronic combined systolic and diastolic CHF: - Echo 08/01/2992: LVEF 35-40%, slightly worse compared to 09/22/2020 (LVEF 40-45%).  - Clinically euvolemic.  Continue farxiga - Metoprolol XL and Entresto is on hold with soft bp/hypotension.   Paroxysmal atrial fibrillation (HCC) - Patient remains in normal sinus rhythm now. - Continue amiodarone IV for now,  change to p.o. once acute infection improves or controlled. - Eliquis resumed but discontinued again, resume once IR prodeure completed.   CAD S/P percutaneous coronary angioplasty - S/p LHC on 04/17/22 recommendations:  Anatomy is similar to prior with the following exceptions: 50% to 70% stenosis proximal to the large first obtuse marginal.  40 to 50% stenosis proximal to the  stent segment in the first diagonal. - He denies any chest pain, troponin mildly elevated at intake likely 2/2 above ICD firing. - Continue medical management.  Resume Plavix tomorrow.   Type 2 diabetes without complication. - Hb A2N 5.1.  Well-controlled. - Continue farxiga for CHF - SSI and accuchecks per protocol.    Essential hypertension - Borderline hypotension at intake improving - Cardiology following - appreciate insight/recs - Continue to hold home entrestro, toprol-xl and lasix - no need for pressors   Seizures ([place]) Continue keppra. Seizure precautions    OSA on CPAP Uses cpap at night  Continue qhs.   Left upper extremity and lower extremity swelling: Venous duplex negative for DVT Upper extremity positive for DVT (left brachial vein) Resume heparin drip, transition to full dose anticoagulation at discharge pending further need for procedure or surgical intervention we will hold off on DOAC at this time   DVT prophylaxis: Heparin drip Code Status: Full Family Communication: None present  Status is: Inpatient  Dispo: The patient is from: Home              Anticipated d/c is to: To be determined              Anticipated d/c date is: 36 to 72 hours pending clinical course, need for further imaging, intervention and ability to control infection with current antibiotic regimen.              Patient currently not medically stable for discharge  Consultants:  Cardiology, general surgery  Antimicrobials:  Unasyn  Subjective: No acute issues or events overnight denies nausea vomiting diarrhea constipation any fevers chills or chest pain  Objective: Vitals:   05/18/22 0910 05/18/22 1605 05/18/22 2030 05/19/22 0439  BP: 121/84 118/84 138/88 126/88  Pulse: 72 70 71 66  Resp:  _0 Temp:  98.1 F (36.7 C) 97.9 F (36.6 C) 97.6 F (36.4 C)  TempSrc:  Oral Oral Oral  SpO2: 98% 96% 98% 99%  Weight:      Height:        Intake/Output Summary (Last 24  hours) at 05/19/2022 0745 Last data filed at 05/19/2022 0318 Gross per 24 hour  Intake 1632.04 ml  Output 2225 ml  Net -592.96 ml    Filed Weights   05/08/22 0841 05/08/22 1626  Weight: 95.7 kg 102.1 kg    Examination:  General:  Pleasantly resting in bed, No acute distress. Lungs:  Clear to auscultate bilaterally without rhonchi, wheeze, or rales. Heart:  Regular rate and rhythm.  Without murmurs, rubs, or gallops. Abdomen:  Soft, nontender, nondistended.  Without guarding or rebound. Extremities: Without cyanosis, clubbing, left upper extremity edema, 1+  Data Reviewed: I have personally reviewed following labs and imaging studies  CBC: Recent Labs  Lab 05/15/22 0229 05/16/22 0619 05/17/22 0708 05/18/22 0317 05/19/22 0554  WBC 14.0* 14.1* 14.7* 12.0* 11.4*  HGB 10.2* 9.6* 9.1* 9.5* 9.1*  HCT 35.9* 31.6* 30.6* 31.9* 30.2*  MCV 87.8 83.4 83.6 82.6 83.2  PLT 327 307 308 287 053    Basic Metabolic Panel: Recent Labs  Lab 05/14/22 0206 05/15/22 0229 05/15/22 0730 05/17/22 0708 05/18/22 0317 05/19/22 0554  NA 130* 133*  --  135 135 135  K 4.4 4.2  --  4.1 4.0 3.9  CL 99 102  --  101 100 104  CO2 26 24  --  _1 GLUCOSE 109* 135*  --  106* 104* 91  BUN  5* 6*  --  7* 7* 7*  CREATININE 0.74 0.65  --  0.68 0.63 0.69  CALCIUM 7.7* 7.5*  --  7.7* 7.8* 7.7*  MG 2.1  --  2.1 2.0 2.1 2.1  PHOS 2.3* 2.7  --   --   --   --     GFR: Estimated Creatinine Clearance: 122.2 mL/min (by C-G formula based on SCr of 0.69 mg/dL). Liver Function Tests: Recent Labs  Lab 05/13/22 0705 05/14/22 0206 05/15/22 0229  AST 70* 69* 50*  ALT 56* 55* 47*  ALKPHOS 174* 164* 138*  BILITOT 0.1* 0.2* 0.2*  PROT 5.5* 5.6* 5.4*  ALBUMIN <1.5* <1.5* <1.5*    No results for input(s): LIPASE, AMYLASE in the last 168 hours. No results for input(s): AMMONIA in the last 168 hours. Coagulation Profile: No results for input(s): INR, PROTIME in the last 168 hours.  Cardiac Enzymes: No  results for input(s): CKTOTAL, CKMB, CKMBINDEX, TROPONINI in the last 168 hours. BNP (last 3 results) No results for input(s): PROBNP in the last 8760 hours. HbA1C: No results for input(s): HGBA1C in the last 72 hours. CBG: Recent Labs  Lab 05/17/22 2108 05/18/22 0738 05/18/22 1222 05/18/22 1611 05/18/22 2137  GLUCAP 136* 103* 101* 130* 127*    Lipid Profile: No results for input(s): CHOL, HDL, LDLCALC, TRIG, CHOLHDL, LDLDIRECT in the last 72 hours. Thyroid Function Tests: No results for input(s): TSH, T4TOTAL, FREET4, T3FREE, THYROIDAB in the last 72 hours. Anemia Panel: No results for input(s): VITAMINB12, FOLATE, FERRITIN, TIBC, IRON, RETICCTPCT in the last 72 hours. Sepsis Labs: No results for input(s): PROCALCITON, LATICACIDVEN in the last 168 hours.  Recent Results (from the past 240 hour(s))  Aerobic/Anaerobic Culture w Gram Stain (surgical/deep wound)     Status: None   Collection Time: 05/09/22  5:38 PM   Specimen: Abscess  Result Value Ref Range Status   Specimen Description ABSCESS  Final   Special Requests NONE  Final   Gram Stain   Final    ABUNDANT WBC PRESENT,BOTH PMN AND MONONUCLEAR FEW GRAM POSITIVE COCCI IN CLUSTERS FEW GRAM POSITIVE COCCI IN CHAINS RARE GRAM NEGATIVE RODS    Culture   Final    ABUNDANT STREPTOCOCCUS ANGINOSIS MODERATE ESCHERICHIA COLI ABUNDANT BACTEROIDES FRAGILIS BETA LACTAMASE POSITIVE Performed at [facility], [place] [address], [place], [place] [address]    Report Status 05/13/2022 FINAL  Final   Organism ID, Bacteria ESCHERICHIA COLI  Final   Organism ID, Bacteria STREPTOCOCCUS ANGINOSIS  Final      Susceptibility   Escherichia coli - MIC*    AMPICILLIN 8 SENSITIVE Sensitive     CEFAZOLIN <=4 SENSITIVE Sensitive     CEFEPIME <=0.12 SENSITIVE Sensitive     CEFTAZIDIME <=1 SENSITIVE Sensitive     CEFTRIAXONE <=0.25 SENSITIVE Sensitive     CIPROFLOXACIN <=0.25 SENSITIVE Sensitive     GENTAMICIN <=1 SENSITIVE Sensitive      IMIPENEM <=0.25 SENSITIVE Sensitive     TRIMETH/SULFA <=20 SENSITIVE Sensitive     AMPICILLIN/SULBACTAM <=2 SENSITIVE Sensitive     PIP/TAZO <=4 SENSITIVE Sensitive     * MODERATE ESCHERICHIA COLI   Streptococcus anginosis - MIC*    PENICILLIN <=0.06 SENSITIVE Sensitive     CEFTRIAXONE <=0.12 SENSITIVE Sensitive     ERYTHROMYCIN 4 RESISTANT Resistant     LEVOFLOXACIN 0.5 SENSITIVE Sensitive     VANCOMYCIN 0.5 SENSITIVE Sensitive     * ABUNDANT STREPTOCOCCUS ANGINOSIS  Radiology Studies: No results found.      Scheduled Meds:  atorvastatin  80 mg Oral QPM   dapagliflozin propanediol  10 mg Oral Daily   docusate sodium  100 mg Oral BID   ezetimibe  10 mg Oral Daily   feeding supplement  237 mL Oral BID BM   insulin aspart  0-9 Units Subcutaneous TID WC   levETIRAcetam  500 mg Oral BID   mexiletine  150 mg Oral Q8H   pantoprazole  40 mg Oral BID   polyethylene glycol  17 g Oral BID   sodium chloride flush  3 mL Intravenous Q12H   sodium chloride flush  5 mL Intracatheter Q8H   Continuous Infusions:  sodium chloride Stopped (05/18/22 0515)   amiodarone 30 mg/hr (05/19/22 0501)   ampicillin-sulbactam (UNASYN) IV 3 g (05/19/22 0510)   heparin 2,100 Units/hr (05/19/22 0318)     LOS: 11 days   Time spent: 44mn  Gregory Loudenslager C Kenslie Abbruzzese, DO [facility]  If 7PM-7AM, please contact night-coverage [email or web address]  05/19/2022, 7:45 AM

## 2022-05-19 NOTE — Progress Notes (Signed)
[place] for heparin (apixaban on hold) Indication: DVT, afib  No Known Allergies  Patient Measurements: Height: '6\' 6"'$  (198.1 cm) Weight: 102.1 kg (225 lb 1.4 oz) IBW/kg (Calculated) : 91.4 Heparin Dosing Weight: 102kg  Vital Signs: Temp: 97.6 F (36.4 C) (06/05 0439) Temp Source: Oral (06/05 0439) BP: 126/88 (06/05 0439) Pulse Rate: 88 (06/05 1216)  Labs: Recent Labs    05/16/22 2053 05/17/22 0708 05/17/22 0708 05/17/22 1744 05/18/22 0317 05/19/22 0554  HGB  --  9.1*   < >  --  9.5* 9.1*  HCT  --  30.6*  --   --  31.9* 30.2*  PLT  --  308  --   --  287 286  APTT 69* 65*  --   --   --   --   HEPARINUNFRC  --  0.28*   < > 0.31 0.33 0.39  CREATININE  --  0.68  --   --  0.63 0.69   < > = values in this interval not displayed.     Estimated Creatinine Clearance: 122.2 mL/min (by C-G formula based on SCr of 0.69 mg/dL).   Medical History: Past Medical History:  Diagnosis Date   AICD (automatic cardioverter/defibrillator) present 2005   CAD (coronary artery disease) 12/01/2013   Chronic combined systolic and diastolic CHF, NYHA class 1 ([place]) 12/01/2013   Erectile dysfunction 12/01/2013   HTN (hypertension) 12/01/2013   Hyperlipidemia 12/01/2013   Ischemic cardiomyopathy 12/01/2013   Presence of permanent cardiac pacemaker    Sleep apnea     Assessment: Pt was on apixaban prior to admission for AF. He was admitted for recurrent ICD shock and ileus. He is s/p colectomy for adenocarcinoma. Doppler showed brachial DVT today. Apixaban has been on hold (last dose 5/30) and CCS is ok with heparin. We will use high rate and avoid bolus for now.   Heparin level 0.39 and within therapeutic goal on 2100 units/h. Hgb remains stable in the 9's, platelets are within normal limits. No signs of bleeding noted.   Goal of Therapy:  Heparin level 0.3-0.7 units/ml aPTT 66-102 secs Monitor platelets by anticoagulation protocol: Yes   Plan:   Continue IV heparin gtt at 2100 units/hr Daily heparin level and CBC Monitor for signs and symptoms of bleeding  Thank you for involving pharmacy in this patient's care.  Erin Hearing PharmD., BCPS Clinical Pharmacist 05/19/2022 1:19 PM

## 2022-05-19 NOTE — Progress Notes (Addendum)
Progress Note  Patient Name: Gregory [name] Date of Encounter: 05/19/2022  [facility] Cardiologist: Sanda , MD   Subjective   No CP, palpitations, SOB, belly feels better  Inpatient Medications    Scheduled Meds:  atorvastatin  80 mg Oral QPM   dapagliflozin propanediol  10 mg Oral Daily   docusate sodium  100 mg Oral BID   ezetimibe  10 mg Oral Daily   feeding supplement  237 mL Oral BID BM   insulin aspart  0-9 Units Subcutaneous TID WC   levETIRAcetam  500 mg Oral BID   mexiletine  150 mg Oral Q8H   pantoprazole  40 mg Oral BID   polyethylene glycol  17 g Oral BID   sodium chloride flush  3 mL Intravenous Q12H   sodium chloride flush  5 mL Intracatheter Q8H   Continuous Infusions:  sodium chloride Stopped (05/18/22 0515)   amiodarone 30 mg/hr (05/19/22 0501)   ampicillin-sulbactam (UNASYN) IV 3 g (05/19/22 0510)   heparin 2,100 Units/hr (05/19/22 0318)   PRN Meds: sodium chloride, acetaminophen **OR** acetaminophen, bisacodyl, diphenhydrAMINE-zinc acetate, HYDROmorphone (DILAUDID) injection, oxyCODONE, sodium chloride flush, sodium phosphate   Vital Signs    Vitals:   05/18/22 0910 05/18/22 1605 05/18/22 2030 05/19/22 0439  BP: 121/84 118/84 138/88 126/88  Pulse: 72 70 71 66  Resp:  _0 Temp:  98.1 F (36.7 C) 97.9 F (36.6 C) 97.6 F (36.4 C)  TempSrc:  Oral Oral Oral  SpO2: 98% 96% 98% 99%  Weight:      Height:        Intake/Output Summary (Last 24 hours) at 05/19/2022 0854 Last data filed at 05/19/2022 0318 Gross per 24 hour  Intake 1632.04 ml  Output 2225 ml  Net -592.96 ml      05/08/2022    4:26 PM 05/08/2022    8:41 AM  Last 3 Weights  Weight (lbs) 225 lb 1.4 oz 211 lb  Weight (kg) 102.1 kg 95.709 kg      Telemetry    SR, no VT since 05/17/22 - Personally Reviewed  ECG    No new EKGs - Personally Reviewed  Physical Exam   GEN: No acute distress.   Neck: No JVD Cardiac: RRR, no murmurs, rubs, or gallops.   Respiratory: CTA b/l. GI: non-tender to light palpation, drain remains MS: No edema; No deformity. Neuro:  Nonfocal  Psych: Normal affect   Labs    High Sensitivity Troponin:   Recent Labs  Lab 05/08/22 0918 05/08/22 1101  TROPONINIHS 32* 30*     Chemistry Recent Labs  Lab 05/13/22 0705 05/14/22 0206 05/15/22 0229 05/15/22 0730 05/17/22 0708 05/18/22 0317 05/19/22 0554  NA 130* 130* 133*  --  135 135 135  K 4.1 4.4 4.2  --  4.1 4.0 3.9  CL 100 99 102  --  101 100 104  CO2 _1 --  _2 GLUCOSE 131* 109* 135*  --  106* 104* 91  BUN 7* 5* 6*  --  7* 7* 7*  CREATININE 0.84 0.74 0.65  --  0.68 0.63 0.69  CALCIUM 7.6* 7.7* 7.5*  --  7.7* 7.8* 7.7*  MG 2.1 2.1  --    < > 2.0 2.1 2.1  PROT 5.5* 5.6* 5.4*  --   --   --   --   ALBUMIN <1.5* <1.5* <1.5*  --   --   --   --  AST 70* 69* 50*  --   --   --   --   ALT 56* 55* 47*  --   --   --   --   ALKPHOS 174* 164* 138*  --   --   --   --   BILITOT 0.1* 0.2* 0.2*  --   --   --   --   GFRNONAA >60 >60 >60  --  >60 >60 >60  ANIONGAP 3* 5 7  --  6 6 3*   < > = values in this interval not displayed.    Lipids No results for input(s): CHOL, TRIG, HDL, LABVLDL, LDLCALC, CHOLHDL in the last 168 hours.  Hematology Recent Labs  Lab 05/17/22 0708 05/18/22 0317 05/19/22 0554  WBC 14.7* 12.0* 11.4*  RBC 3.66* 3.86* 3.63*  HGB 9.1* 9.5* 9.1*  HCT 30.6* 31.9* 30.2*  MCV 83.6 82.6 83.2  MCH 24.9* 24.6* 25.1*  MCHC 29.7* 29.8* 30.1  RDW 22.8* 22.9* 22.8*  PLT 308 287 286   Thyroid  No results for input(s): TSH, FREET4 in the last 168 hours.   BNPNo results for input(s): BNP, PROBNP in the last 168 hours.  DDimer No results for input(s): DDIMER in the last 168 hours.   Radiology    No results found.  Cardiac Studies   04/17/22: [facility] CONCLUSIONS: Widely patent left main Mid segment of LAD contains 40 to 50% narrowing.  The large first diagonal contains a patent stent in the mid body.  Proximal to the stent is a  50% stenosis. Circumflex is totally occluded beyond the origin of the large first obtuse marginal.  Left-to-right collaterals are noted.  The proximal circumflex before the origin of the first obtuse marginal contains 50 to 70% stenosis.  There is progression of this lesion compared to prior. Right coronary is totally occluded proximally.  It reconstitutes in the distal segment.  PDA is totally occluded.  PDA and left ventricular branches fill via left to right collaterals. Recommendations: Per managing team. Anatomy is similar to prior with the following exceptions: 50% to 70% stenosis proximal to the large first obtuse marginal.  40 to 50% stenosis proximal to the stent segment in the first diagonal.     Echocardiogram 04/07/2022: Impressions:  1. Difficult acoustic windows Endocardium not well seen Overall there  does not appear to be a signfiicant change from echo in 2021.   2. LV function is depressed with hypokinesis of the lateral wall,  mid/distal inferior wall and apex; akinesis with aneurysmal dilitation of  inferior base. . Left ventricular ejection fraction, by estimation, is 35  to 40%. The left ventricle has  moderately decreased function. The left ventricular internal cavity size  was moderately dilated. There is mild left ventricular hypertrophy. Left  ventricular diastolic parameters are indeterminate.   3. Right ventricular systolic function is normal. The right ventricular  size is normal. There is mildly elevated pulmonary artery systolic  pressure.   4. Left atrial size was severely dilated.   5. Right atrial size was mildly dilated.   6. The mitral valve is normal in structure. Mild mitral valve  regurgitation.   7. The aortic valve is tricuspid. Aortic valve regurgitation is not  visualized. Aortic valve sclerosis/calcification is present, without any  evidence of aortic stenosis.   8. There is borderline dilatation of the aortic root, measuring 39 mm.  There is  borderline dilatation of the ascending aorta, measuring 39 mm.   Patient Profile  61 y.o. male male with  history of CAD (known chronic occl of RCA with prior PCIs to LAD, diag, and LCx), VT, ICM,  OSA w/CPAP, seizures, chronic CHF, HLD, HTN, obesity, AFib, h/o VT, PVCs.   S/p R hemicolectomy 2/2 mass >> ca   Device information [employer] Dual Chamber ICD implanted 2005, 2010 and gen change for 2018    2017 to have slow VT prompting reduction of his VT zone rate to 150bpm.   Oct 2021, VT with appropriate therapies   He had stopped drinking (heavily at least).  He noted previously had been on amiodarone and mexiletine , the mexiletine stopped 2/2 GI symptoms, and the amiodarone stopped 2/2 abn ALk phos   Oct 2021 > amio restarted for VT   Comes with VT storm , though suspect at least currently provoked by his acute abdominal issue  Assessment & Plan    VT storm Long hx of the same, though likely provoked this time by acute abdominal process (As well as ischemic heart disease, question of sarcoid raised 2/2 noted degree of lacrimal enlargement) ATPs are working  Had more VT 6/3 off amio gtt (on PO) and was resumed, mexiletine added yesterday None further so far.  In d/w patient/IM, apparently when they tried to mobilize him is when he had more VT Saturday. The patient is a bit a but scared to give it another try.  I will d/w Dr. Herminio Heads gtt plans, likely try to get him mobilized with amio gtt in place  and the mexiletine today, if he does OK, perhaps transition to po tomorrow.  2. CAD Stable by cath earlier this month No CP Neg HS Trops  3. Chronic CHF Has some edema, no SOB Meds held with some lower BPs yesterday on admission Follow closely  4. Paroxysmal Afib SR here Eliquis held for possible need of intervention/surgery on heparin  5. DVT Heparin gtt  6. Abdominal abscess 7. Sepsis 8. 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 10. Colon cancer     For questions or updates, please contact [place] Please consult [email or web address] for contact info under        Signed, Baldwin [place], PA-C  05/19/2022, 8:54 AM   Recurrent ventricular tachycardia, mexiletine started by Dr. Daune Perch over the weekend.  Some nonsustained ventricular tachycardia today.  We will continue with combination of amiodarone and mexiletine and anticipate transition to p.o. amiodarone tomorrow.  Volume overloaded.  We will begin low-dose oral diuretics

## 2022-05-19 NOTE — Progress Notes (Signed)
Occupational Therapy Treatment Patient Details Name: Gregory [name] MRN: [id number] DOB: 1961-01-03 Today's Date: 05/19/2022   History of present illness 61 yo male admitted 5/25 after AICD firing x3 and pt experiencing SOB. 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 PMHx: CAD, VT, OSA on CPAP, seizures, CHF, HLD, HTN, obesity, afib, PVCs, and metastatic colon CA s/p colon resection 5/5.   OT comments  Patient making good gains with OT treatment with mobility, transfers, and standing tolerance. Patient received in recliner and required min assist to stand due to low surface and ambulated to [facility] across bedroom with min guard/min assist. Patient able to perform toilet hygiene standing with min assist to complete. Patient required seated rest break on EOB before ambulating to sink for hand and face hygiene. Patient performed static standing from EOB and was able to tolerate 1-2 minutes of standing. Patient returned to supine with assistance for BLEs. Patient would benefit from further therapy in AIR setting to provided more aggressive rehab.    Recommendations for follow up therapy are one component of a multi-disciplinary discharge planning process, led by the attending physician.  Recommendations may be updated based on patient status, additional functional criteria and insurance authorization.    Follow Up Recommendations  Acute inpatient rehab (3hours/day)    Assistance Recommended at Discharge Intermittent Supervision/Assistance  Patient can return home with the following  A little help with bathing/dressing/bathroom;Help with stairs or ramp for entrance;Assistance with cooking/housework;Assist for transportation   Equipment Recommendations  Other (comment);BSC/3in1    Recommendations for Other Services      Precautions / Restrictions  Precautions Precautions: Fall Precaution Comments: watch O2 and HR, jp drain Restrictions Weight Bearing Restrictions: No       Mobility Bed Mobility Overal bed mobility: Needs Assistance Bed Mobility: Rolling, Sit to Sidelying Rolling: Supervision       Sit to sidelying: Min assist General bed mobility comments: required assistance with BLEs to get back to bed    Transfers Overall transfer level: Needs assistance Equipment used: Rolling walker (2 wheels) Transfers: Sit to/from Stand, Bed to chair/wheelchair/BSC Sit to Stand: Min guard, Min assist           General transfer comment: patient min assist to stand from recliner and min guard to min assist to stand from [facility] and EOB     Balance Overall balance assessment: Needs assistance Sitting-balance support: No upper extremity supported, Feet supported Sitting balance-Leahy Scale: Good Sitting balance - Comments: sitting on EOB without assitance   Standing balance support: Bilateral upper extremity supported, No upper extremity supported, During functional activity Standing balance-Leahy Scale: Poor Standing balance comment: able to stand at sink for hand hygiene, one extremity support to wash face                           ADL either performed or assessed with clinical judgement   ADL Overall ADL's : Needs assistance/impaired     Grooming: Wash/dry hands;Wash/dry face;Minimal assistance;Standing Grooming Details (indicate cue type and reason): stood at [employer] Transfer: Minimal assistance;Ambulation;BSC/3in1 Armed forces technical officer Details (indicate cue type and reason): ambulated to [facility] from recliner with min assist Toileting- Clothing Manipulation and Hygiene: Minimal assistance;Sit to/from stand Toileting - Water quality scientist Details (  indicate cue type and reason): required min assist to complete       General ADL Comments: increased tolerance to standing with patient able  to stand at sink for hand and face hygiene    Extremity/Trunk Assessment              Vision       Perception     Praxis      Cognition Arousal/Alertness: Awake/alert Behavior During Therapy: WFL for tasks assessed/performed Overall Cognitive Status: Within Functional Limits for tasks assessed                                          Exercises      Shoulder Instructions       General Comments      Pertinent Vitals/ Pain       Pain Assessment Pain Assessment: Faces Faces Pain Scale: Hurts a little bit Pain Location: abdomen Pain Descriptors / Indicators: Aching, Guarding Pain Intervention(s): Limited activity within patient's tolerance, Monitored during session  Home Living                                          Prior Functioning/Environment              Frequency  Min 2X/week        Progress Toward Goals  OT Goals(current goals can now be found in the care plan section)  Progress towards OT goals: Progressing toward goals  Acute Rehab OT Goals Patient Stated Goal: go to rehab OT Goal Formulation: With patient Time For Goal Achievement: 05/28/22 Potential to Achieve Goals: Good ADL Goals Pt Will Perform Lower Body Bathing: with modified independence;sit to/from stand;sitting/lateral leans Pt Will Perform Lower Body Dressing: with modified independence;sit to/from stand Pt Will Transfer to Toilet: with modified independence;ambulating Pt/caregiver will Perform Home Exercise Program: Increased strength;Both right and left upper extremity;With theraband;Independently;With written HEP provided Additional ADL Goal #1: Pt to increase standing tolerance > 10 min during ADLs/mobility to improve overall endurance  Plan Discharge plan remains appropriate    Co-evaluation                 AM-PAC OT "6 Clicks" Daily Activity     Outcome Measure   Help from another person eating meals?: None Help from  another person taking care of personal grooming?: A Little Help from another person toileting, which includes using toliet, bedpan, or urinal?: A Little Help from another person bathing (including washing, rinsing, drying)?: A Little Help from another person to put on and taking off regular upper body clothing?: A Little Help from another person to put on and taking off regular lower body clothing?: A Little 6 Click Score: 19    End of Session Equipment Utilized During Treatment: Rolling walker (2 wheels);Gait belt  OT Visit Diagnosis: Other abnormalities of gait and mobility (R26.89);Muscle weakness (generalized) (M62.81)   Activity Tolerance Patient tolerated treatment well   Patient Left in bed;with call bell/phone within reach;with bed alarm set   Nurse Communication Mobility status        Time: 7035-0093 OT Time Calculation (min): 32 min  Charges: OT General Charges $OT Visit: 1 Visit OT Treatments $Self Care/Home Management : 23-37 mins  Gregory [name], [place]  Pager [phone] Office  [phone]   Gregory [name] 05/19/2022, 3:09 PM

## 2022-05-19 NOTE — Progress Notes (Signed)
Supervising Physician: Arne Cleveland  Patient Status:  [facility] - In-pt  Chief Complaint:  Abdominal postoperative abscess s/p drain placement 5/26 and upsize 5/31  Subjective:  Concerned drain is clogged because he sees solid particles in OP  Allergies: Patient has no known allergies.  Medications: Prior to Admission medications   Medication Sig Start Date End Date Taking? Authorizing Provider  acetaminophen (TYLENOL) 500 MG tablet Take 1,000 mg by mouth every 6 (six) hours as needed for moderate pain.   Yes [provider]  amiodarone (PACERONE) 200 MG tablet Take 2 tablets (400 mg total) by mouth daily. 04/24/22  Yes Patrecia Pour, MD  apixaban (ELIQUIS) 5 MG TABS tablet TAKE 1 TABLET(5 MG) BY MOUTH TWICE DAILY Patient taking differently: Take 5 mg by mouth 2 (two) times daily. 04/11/22  Yes Croitoru, [name], MD  atorvastatin (LIPITOR) 80 MG tablet TAKE ONE TABLET BY MOUTH EVERY EVENING Patient taking differently: Take 80 mg by mouth every evening. 05/05/22  Yes Croitoru, [name], MD  clopidogrel (PLAVIX) 75 MG tablet TAKE 1 TABLET(75 MG) BY MOUTH DAILY Patient taking differently: Take 75 mg by mouth daily. TAKE 1 TABLET(75 MG) BY MOUTH DAILY 10/02/21  Yes Croitoru, [name], MD  dapagliflozin propanediol (FARXIGA) 10 MG TABS tablet Take 1 tablet (10 mg total) by mouth daily. 05/08/22  Yes Croitoru, [name], MD  ezetimibe (ZETIA) 10 MG tablet Take 1 tablet (10 mg total) by mouth daily. 03/13/22  Yes Croitoru, [name], MD  furosemide (LASIX) 20 MG tablet Take 2 tablets (40 mg total) by mouth 3 (three) times a week. Patient taking differently: Take 40 mg by mouth every Monday, Wednesday, and Friday. 03/12/22  Yes [name], MD  levETIRAcetam (KEPPRA) 500 MG tablet TAKE 1 TABLET(500 MG) BY MOUTH TWICE DAILY Patient taking differently: Take 500 mg by mouth 2 (two) times daily. 04/28/22  Yes [name], MD  metoprolol succinate (TOPROL-XL) 25 MG 24 hr tablet Take one tablet, 25  mg, in the morning and 50 mg, two tablets, in the evening. Patient taking differently: 25 mg. Per patient taking 25 mg in the morning and 25 mg at night 06/27/21  Yes Croitoru, [name], MD  oxyCODONE (OXY IR/ROXICODONE) 5 MG immediate release tablet Take 1-2 tablets (5-10 mg total) by mouth every 8 (eight) hours as needed for breakthrough pain. 05/05/22  Yes [name], MD  potassium chloride (KLOR-CON M) 10 MEQ tablet Take 1 tablet (10 mEq total) by mouth daily. 12/02/21  Yes Croitoru, [name], MD  sacubitril-valsartan (ENTRESTO) 49-51 MG Take 1 tablet by mouth 2 (two) times daily. 10/07/21  Yes Croitoru, [name], MD  pantoprazole (PROTONIX) 40 MG tablet TAKE 1 TABLET(40 MG) BY MOUTH DAILY Patient taking differently: Take 40 mg by mouth daily. TAKE 1 TABLET(40 MG) BY MOUTH DAILY 03/13/22   Croitoru, [name], MD  sildenafil (VIAGRA) 50 MG tablet Take 1 tablet (50 mg total) by mouth as needed for erectile dysfunction. 01/25/21   Shirley Friar, PA-C     Vital Signs: BP (!) 125/94 (BP Location: Right Arm)   Pulse 88   Temp 98.2 F (36.8 C) (Oral)   Resp 18   Ht '6\' 6"'$  (1.981 m)   Wt 225 lb 1.4 oz (102.1 kg)   SpO2 95%   BMI 26.01 kg/m   Physical Exam Vitals reviewed.  Constitutional:      General: He is not in acute distress. HENT:     Head: Normocephalic and atraumatic.  Cardiovascular:  Rate and Rhythm: Normal rate.  Pulmonary:     Effort: Pulmonary effort is normal.  Abdominal:     Palpations: Abdomen is soft.     Tenderness: There is abdominal tenderness.  Skin:    General: Skin is warm and dry.  Neurological:     General: No focal deficit present.     Mental Status: He is alert and oriented to person, place, and time.  Psychiatric:        Mood and Affect: Mood normal.        Behavior: Behavior normal.   Drain Location: RLQ Size: Fr size: 14 Fr Date of placement: 05/14/22 Currently to: Drain collection device: suction bulb 24 hour output:  Output by Drain (mL)  05/17/22 0700 - 05/17/22 1459 05/17/22 1500 - 05/17/22 2259 05/17/22 2300 - 05/18/22 0659 05/18/22 0700 - 05/18/22 1459 05/18/22 1500 - 05/18/22 2259 05/18/22 2300 - 05/19/22 0659 05/19/22 0700 - 05/19/22 1459 05/19/22 1500 - 05/19/22 1631  Closed System Drain Right RLQ  14 Fr. 210  30 65 100 75  40    Current examination: Flushes easily.  Insertion site has purulent material leaking around the catheter. Suture and stat lock in place. Dressed appropriately.  OP is white, thick, and purulent    Imaging: No results found.  Labs:  CBC: Recent Labs    05/16/22 0619 05/17/22 0708 05/18/22 0317 05/19/22 0554  WBC 14.1* 14.7* 12.0* 11.4*  HGB 9.6* 9.1* 9.5* 9.1*  HCT 31.6* 30.6* 31.9* 30.2*  PLT 307 308 287 286    COAGS: Recent Labs    05/08/22 1637 05/09/22 1325 05/15/22 2250 05/16/22 0619 05/16/22 2053 05/17/22 0708  INR 1.7* 1.6*  --   --   --   --   APTT  --   --  64* 55* 69* 65*    BMP: Recent Labs    05/15/22 0229 05/17/22 0708 05/18/22 0317 05/19/22 0554  NA 133* 135 135 135  K 4.2 4.1 4.0 3.9  CL 102 101 100 104  CO2 '24 28 29 28  '$ GLUCOSE 135* 106* 104* 91  BUN 6* 7* 7* 7*  CALCIUM 7.5* 7.7* 7.8* 7.7*  CREATININE 0.65 0.68 0.63 0.69  GFRNONAA >60 >60 >60 >60    LIVER FUNCTION TESTS: Recent Labs    05/12/22 0116 05/13/22 0705 05/14/22 0206 05/15/22 0229  BILITOT 0.3 0.1* 0.2* 0.2*  AST 80* 70* 69* 50*  ALT 52* 56* 55* 47*  ALKPHOS 174* 174* 164* 138*  PROT 5.4* 5.5* 5.6* 5.4*  ALBUMIN <1.5* <1.5* <1.5* <1.5*    Assessment and Plan:  Intra-abdominal abscess --s/p drainage catheter placement --thick purulent output --will have nursing increase flushing and dressing changes.   Electronically Signed: Pasty Spillers, PA 05/19/2022, 4:20 PM   I spent a total of 15 Minutes at the the patient's bedside AND on the patient's hospital floor or unit, greater than 50% of which was counseling/coordinating care for intra-abdominal  abscess.

## 2022-05-19 NOTE — Progress Notes (Signed)
Chief Complaint/Subjective: Abdominal pain less during the day.  Wants more than his D3 diet.  Moving his bowels  Objective: Vital signs in last 24 hours: Temp:  [97.6 F (36.4 C)-98.1 F (36.7 C)] 97.6 F (36.4 C) (06/05 0439) Pulse Rate:  [66-71] 66 (06/05 0439) Resp:  [16-18] 16 (06/05 0439) BP: (118-138)/(84-88) 126/88 (06/05 0439) SpO2:  [96 %-99 %] 99 % (06/05 0439) Last BM Date : 05/13/22 Intake/Output from previous day: 06/04 0701 - 06/05 0700 In: 1632 [P.O.:720; I.V.:807.5; IV Piggyback:99.6] Out: 2225 [Urine:1985; Drains:240] Intake/Output this shift: No intake/output data recorded.  PE: Gen: NAD Resp: nonlabored Card: normal rate Abd: soft, slight distension, tan, milky output from drain, tender minimally  Lab Results:  Recent Labs    05/18/22 0317 05/19/22 0554  WBC 12.0* 11.4*  HGB 9.5* 9.1*  HCT 31.9* 30.2*  PLT 287 286   BMET Recent Labs    05/18/22 0317 05/19/22 0554  NA 135 135  K 4.0 3.9  CL 100 104  CO2 29 28  GLUCOSE 104* 91  BUN 7* 7*  CREATININE 0.63 0.69  CALCIUM 7.8* 7.7*   PT/INR No results for input(s): LABPROT, INR in the last 72 hours. CMP     Component Value Date/Time   NA 135 05/19/2022 0554   NA 137 04/11/2022 1219   K 3.9 05/19/2022 0554   CL 104 05/19/2022 0554   CO2 28 05/19/2022 0554   GLUCOSE 91 05/19/2022 0554   BUN 7 (L) 05/19/2022 0554   BUN 13 04/11/2022 1219   CREATININE 0.69 05/19/2022 0554   CREATININE 1.08 12/12/2016 1445   CALCIUM 7.7 (L) 05/19/2022 0554   PROT 5.4 (L) 05/15/2022 0229   PROT 6.5 02/28/2022 1304   ALBUMIN <1.5 (L) 05/15/2022 0229   ALBUMIN 4.0 02/28/2022 1304   AST 50 (H) 05/15/2022 0229   ALT 47 (H) 05/15/2022 0229   ALKPHOS 138 (H) 05/15/2022 0229   BILITOT 0.2 (L) 05/15/2022 0229   BILITOT 0.2 02/28/2022 1304   GFRNONAA >60 05/19/2022 0554   GFRAA 91 11/15/2020 0959   Lipase     Component Value Date/Time   LIPASE 120 (H) 09/09/2020 1810    Studies/Results: No  results found.  Anti-infectives: Anti-infectives (From admission, onward)    Start     Dose/Rate Route Frequency Ordered Stop   05/13/22 1800  Ampicillin-Sulbactam (UNASYN) 3 g in sodium chloride 0.9 % 100 mL IVPB        3 g 200 mL/hr over 30 Minutes Intravenous Every 6 hours 05/13/22 1136     05/08/22 2200  piperacillin-tazobactam (ZOSYN) IVPB 3.375 g  Status:  Discontinued        3.375 g 12.5 mL/hr over 240 Minutes Intravenous Every 8 hours 05/08/22 2008 05/13/22 1136       Assessment/Plan POD#29 s/p lap assisted R colectomy for adenocarcinoma, Dr. 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 Has seen Dr. 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 Does not need vaccines since selective embo. -s/p IR drain exchange and repositioning 5/31 -cont drain due to high output still. -tolerating diet -surgically stable.  Will see again Wednesday  FEN - soft, increase to miralax BID, add Ensure VTE - heparin gtt.  Ok to resume plavix and eliquis from our standpoint ID - Zosyn 5/25>>5/30, unasyn 5/30>>   - Per TRH/Cards -  CAD with history of PCI to LAD Recurrent ventricular tachycardia status post AICD placement, three episodes last PM as long as 55m Cardiology was notified, expected given underlying illness and tolerating episodes.  Ischemic cardiomyopathy CHF, systolic and diastolic, LV EF 35 to 400%Atrial fibrillation    LOS: 11 days   KEvalyn CascoSurgery 05/19/2022, 10:32 AM Please see Amion for pager number during day hours 7:00am-4:30pm or 7:00am -11:30am on  weekends

## 2022-05-19 NOTE — Progress Notes (Signed)
Physical Therapy Treatment Patient Details Name: Gregory [name] MRN: [id number] DOB: 06/26/1961 Today's Date: 05/19/2022   History of Present Illness 61 yo male admitted 5/25 after AICD firing x3 and pt experiencing SOB. 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 PMHx: CAD, VT, OSA on CPAP, seizures, CHF, HLD, HTN, obesity, afib, PVCs, and metastatic colon CA s/p colon resection 5/5.    PT Comments    Pt pleasant and wanting to progress to AIR then home not SNF. Pt with increased activity tolerance this session with push for activity and awareness of needing to tolerate activity to consider AIR and to progress strength to get up stairs at home. Pt is tall and not comfortable in recliner (boosted height) as he sits on side of bed at home. Pt educated for benefit of sitting to perform continued seated HEp rather than in supine. Pt would benefit from intensive therapy to achieve level of strength to return home. If AIR denies then SNF until able to complete flight of stairs for home.     Recommendations for follow up therapy are one component of a multi-disciplinary discharge planning process, led by the attending physician.  Recommendations may be updated based on patient status, additional functional criteria and insurance authorization.  Follow Up Recommendations  Acute inpatient rehab (3hours/day)     Assistance Recommended at Discharge Intermittent Supervision/Assistance  Patient can return home with the following A little help with walking and/or transfers;A little help with bathing/dressing/bathroom;Assistance with cooking/housework;Assist for transportation;Help with stairs or ramp for entrance   Equipment Recommendations  Rolling walker (2 wheels);BSC/3in1    Recommendations for Other Services       Precautions / Restrictions  Precautions Precautions: Fall Precaution Comments: watch O2 and HR, jp drain     Mobility  Bed Mobility Overal bed mobility: Needs Assistance Bed Mobility: Rolling, Sidelying to Sit Rolling: Min assist Sidelying to sit: Min assist, HOB elevated       General bed mobility comments: HOb 20 degrees with cues and assist to fully roll to right and rise from surface    Transfers Overall transfer level: Needs assistance   Transfers: Sit to/from Stand, Bed to chair/wheelchair/BSC Sit to Stand: Min guard, Min assist Stand pivot transfers: Min guard         General transfer comment: minguard to rise from bSC and bed, min assist to rise from recliner with cues for sequence x 4 trials total. mingurad with RW bed to bSC pivot    Ambulation/Gait Ambulation/Gait assistance: Min assist Gait Distance (Feet): 30 Feet Assistive device: Rolling walker (2 wheels) Gait Pattern/deviations: Step-through pattern, Decreased stride length, Trunk flexed   Gait velocity interpretation: 1.31 - 2.62 ft/sec, indicative of limited community ambulator   General Gait Details: cues for posture and proximity to RW. Pt walked 30' x 2 then additional 15' all with seated rest between trials   Stairs             Wheelchair Mobility    Modified Rankin (Stroke Patients Only)       Balance Overall balance assessment: Needs assistance   Sitting balance-Leahy Scale: Good Sitting balance - Comments: static sitting without LOB   Standing balance support: Bilateral upper extremity supported, During functional activity Standing balance-Leahy Scale: Poor Standing balance comment: reliant on UE support  Cognition Arousal/Alertness: Awake/alert Behavior During Therapy: WFL for tasks assessed/performed Overall Cognitive Status: Within Functional Limits for tasks assessed                                          Exercises General Exercises -  Lower Extremity Hip Flexion/Marching: AROM, Both, Standing, 20 reps    General Comments        Pertinent Vitals/Pain Pain Assessment Pain Score: 7  Pain Location: abdomen Pain Descriptors / Indicators: Aching, Guarding Pain Intervention(s): Limited activity within patient's tolerance    Home Living                          Prior Function            PT Goals (current goals can now be found in the care plan section) Progress towards PT goals: Progressing toward goals    Frequency    Min 3X/week      PT Plan Discharge plan needs to be updated    Co-evaluation              AM-PAC PT "6 Clicks" Mobility   Outcome Measure  Help needed turning from your back to your side while in a flat bed without using bedrails?: A Little Help needed moving from lying on your back to sitting on the side of a flat bed without using bedrails?: A Little Help needed moving to and from a bed to a chair (including a wheelchair)?: A Little Help needed standing up from a chair using your arms (e.g., wheelchair or bedside chair)?: A Little Help needed to walk in hospital room?: A Little Help needed climbing 3-5 steps with a railing? : Total 6 Click Score: 16    End of Session Equipment Utilized During Treatment: Gait belt Activity Tolerance: Patient tolerated treatment well Patient left: in chair;with call bell/phone within reach;with chair alarm set Nurse Communication: Mobility status PT Visit Diagnosis: Other abnormalities of gait and mobility (R26.89);Muscle weakness (generalized) (M62.81)     Time: 8099-8338 PT Time Calculation (min) (ACUTE ONLY): 48 min  Charges:  $Gait Training: 8-22 mins $Therapeutic Activity: 23-37 mins                     Senia Even P, PT Acute Rehabilitation Services Pager: [phone] Office: [place] 05/19/2022, 12:54 PM

## 2022-05-20 DIAGNOSIS — I472 Ventricular tachycardia, unspecified: Secondary | ICD-10-CM | POA: Diagnosis not present

## 2022-05-20 DIAGNOSIS — I5042 Chronic combined systolic (congestive) and diastolic (congestive) heart failure: Secondary | ICD-10-CM | POA: Diagnosis not present

## 2022-05-20 LAB — GLUCOSE, CAPILLARY
Glucose-Capillary: 92 mg/dL (ref 70–99)
Glucose-Capillary: 80 mg/dL (ref 70–99)
Glucose-Capillary: 112 mg/dL — ABNORMAL HIGH (ref 70–99)
Glucose-Capillary: 120 mg/dL — ABNORMAL HIGH (ref 70–99)

## 2022-05-20 LAB — MAGNESIUM: Magnesium: 2.1 mg/dL (ref 1.7–2.4)

## 2022-05-20 LAB — BASIC METABOLIC PANEL
Anion gap: 4 — ABNORMAL LOW (ref 5–15)
BUN: 8 mg/dL (ref 8–23)
CO2: 28 mmol/L (ref 22–32)
Calcium: 7.7 mg/dL — ABNORMAL LOW (ref 8.9–10.3)
Chloride: 104 mmol/L (ref 98–111)
Creatinine, Ser: 0.75 mg/dL (ref 0.61–1.24)
GFR, Estimated: >60 mL/min (ref >60)
Glucose, Bld: 94 mg/dL (ref 70–99)
Potassium: 3.7 mmol/L (ref 3.5–5.1)
Sodium: 136 mmol/L (ref 135–145)

## 2022-05-20 LAB — HEPARIN LEVEL (UNFRACTIONATED): Heparin Unfractionated: 0.4 IU/mL (ref 0.30–0.70)

## 2022-05-20 MED ORDER — OXYCODONE-ACETAMINOPHEN 5-325 MG PO TABS
1.0000 | ORAL_TABLET | ORAL | Status: DC | PRN
  Administered 2022-05-20 – 2022-05-22 (×9): 2 via ORAL
  Filled 2022-05-20 (×9): qty 2

## 2022-05-20 MED ORDER — HYDROMORPHONE HCL 1 MG/ML IJ SOLN: 0.5000 mg | Freq: Four times a day (QID) | INTRAMUSCULAR | Status: DC | PRN

## 2022-05-20 MED ORDER — APIXABAN 5 MG PO TABS
5.0000 mg | ORAL_TABLET | Freq: Two times a day (BID) | ORAL | Status: DC
  Administered 2022-05-20 – 2022-05-22 (×5): 5 mg via ORAL
  Filled 2022-05-20 (×5): qty 1

## 2022-05-20 MED ORDER — AMIODARONE HCL 200 MG PO TABS
400.0000 mg | ORAL_TABLET | Freq: Two times a day (BID) | ORAL | Status: DC
  Administered 2022-05-20 – 2022-05-22 (×5): 400 mg via ORAL
  Filled 2022-05-20 (×5): qty 2

## 2022-05-20 NOTE — Progress Notes (Addendum)
Progress Note  Patient Name: Gregory [name] Date of Encounter: 05/20/2022  [facility] Cardiologist: Sanda , MD   Subjective   No CP, palpitations, SOB, belly feels better, a little lightheaded yesterday when OOB  Inpatient Medications    Scheduled Meds:  atorvastatin  80 mg Oral QPM   dapagliflozin propanediol  10 mg Oral Daily   docusate sodium  100 mg Oral BID   ezetimibe  10 mg Oral Daily   feeding supplement  237 mL Oral BID BM   furosemide  40 mg Oral BID   insulin aspart  0-9 Units Subcutaneous TID WC   levETIRAcetam  500 mg Oral BID   mexiletine  150 mg Oral Q8H   pantoprazole  40 mg Oral BID   polyethylene glycol  17 g Oral BID   sodium chloride flush  3 mL Intravenous Q12H   sodium chloride flush  5 mL Intracatheter Q8H   Continuous Infusions:  sodium chloride 250 mL (05/19/22 2041)   amiodarone 30 mg/hr (05/20/22 0436)   ampicillin-sulbactam (UNASYN) IV 3 g (05/20/22 0437)   heparin 2,100 Units/hr (05/20/22 0313)   PRN Meds: sodium chloride, acetaminophen **OR** acetaminophen, bisacodyl, diphenhydrAMINE-zinc acetate, HYDROmorphone (DILAUDID) injection, oxyCODONE, sodium chloride flush, sodium phosphate   Vital Signs    Vitals:   05/19/22 1216 05/19/22 1330 05/19/22 2049 05/20/22 0457  BP:  (!) 125/94 123/79 125/90  Pulse: 88  80 71  Resp:  '18 18 19  ' Temp:  98.2 F (36.8 C) 98.3 F (36.8 C) 97.6 F (36.4 C)  TempSrc:  Oral Oral Oral  SpO2: 95%  (!) 88% 91%  Weight:      Height:        Intake/Output Summary (Last 24 hours) at 05/20/2022 0711 Last data filed at 05/20/2022 0437 Gross per 24 hour  Intake 1682.03 ml  Output 1441 ml  Net 241.03 ml      05/08/2022    4:26 PM 05/08/2022    8:41 AM  Last 3 Weights  Weight (lbs) 225 lb 1.4 oz 211 lb  Weight (kg) 102.1 kg 95.709 kg      Telemetry    SR, 70's, he has had some VT, rates about 100, no therapies- Personally Reviewed  ECG    No new EKGs - Personally  Reviewed  Physical Exam   Essentially unchanged exam GEN: No acute distress.   Neck: No JVD Cardiac: RRR, no murmurs, rubs, or gallops.  Respiratory: CTA b/l. GI: non-tender to light palpation, drain remains MS: No edema; No deformity. Neuro:  Nonfocal  Psych: Normal affect   Labs    High Sensitivity Troponin:   Recent Labs  Lab 05/08/22 0918 05/08/22 1101  TROPONINIHS 32* 30*     Chemistry Recent Labs  Lab 05/14/22 0206 05/15/22 0229 05/15/22 0730 05/17/22 0708 05/18/22 0317 05/19/22 0554 05/20/22 0415  NA 130* 133*  --  135 135 135  --   K 4.4 4.2  --  4.1 4.0 3.9  --   CL 99 102  --  101 100 104  --   CO2 26 24  --  '28 29 28  ' --   GLUCOSE 109* 135*  --  106* 104* 91  --   BUN 5* 6*  --  7* 7* 7*  --   CREATININE 0.74 0.65  --  0.68 0.63 0.69  --   CALCIUM 7.7* 7.5*  --  7.7* 7.8* 7.7*  --   MG 2.1  --    < >  2.0 2.1 2.1 2.1  PROT 5.6* 5.4*  --   --   --   --   --   ALBUMIN <1.5* <1.5*  --   --   --   --   --   AST 69* 50*  --   --   --   --   --   ALT 55* 47*  --   --   --   --   --   ALKPHOS 164* 138*  --   --   --   --   --   BILITOT 0.2* 0.2*  --   --   --   --   --   GFRNONAA >60 >60  --  >60 >60 >60  --   ANIONGAP 5 7  --  6 6 3*  --    < > = values in this interval not displayed.    Lipids No results for input(s): CHOL, TRIG, HDL, LABVLDL, LDLCALC, CHOLHDL in the last 168 hours.  Hematology Recent Labs  Lab 05/17/22 0708 05/18/22 0317 05/19/22 0554  WBC 14.7* 12.0* 11.4*  RBC 3.66* 3.86* 3.63*  HGB 9.1* 9.5* 9.1*  HCT 30.6* 31.9* 30.2*  MCV 83.6 82.6 83.2  MCH 24.9* 24.6* 25.1*  MCHC 29.7* 29.8* 30.1  RDW 22.8* 22.9* 22.8*  PLT 308 287 286   Thyroid  No results for input(s): TSH, FREET4 in the last 168 hours.   BNPNo results for input(s): BNP, PROBNP in the last 168 hours.  DDimer No results for input(s): DDIMER in the last 168 hours.   Radiology    No results found.  Cardiac Studies   04/17/22: [facility] CONCLUSIONS: Widely patent  left main Mid segment of LAD contains 40 to 50% narrowing.  The large first diagonal contains a patent stent in the mid body.  Proximal to the stent is a 50% stenosis. Circumflex is totally occluded beyond the origin of the large first obtuse marginal.  Left-to-right collaterals are noted.  The proximal circumflex before the origin of the first obtuse marginal contains 50 to 70% stenosis.  There is progression of this lesion compared to prior. Right coronary is totally occluded proximally.  It reconstitutes in the distal segment.  PDA is totally occluded.  PDA and left ventricular branches fill via left to right collaterals. Recommendations: Per managing team. Anatomy is similar to prior with the following exceptions: 50% to 70% stenosis proximal to the large first obtuse marginal.  40 to 50% stenosis proximal to the stent segment in the first diagonal.     Echocardiogram 04/07/2022: Impressions:  1. Difficult acoustic windows Endocardium not well seen Overall there  does not appear to be a signfiicant change from echo in 2021.   2. LV function is depressed with hypokinesis of the lateral wall,  mid/distal inferior wall and apex; akinesis with aneurysmal dilitation of  inferior base. . Left ventricular ejection fraction, by estimation, is 35  to 40%. The left ventricle has  moderately decreased function. The left ventricular internal cavity size  was moderately dilated. There is mild left ventricular hypertrophy. Left  ventricular diastolic parameters are indeterminate.   3. Right ventricular systolic function is normal. The right ventricular  size is normal. There is mildly elevated pulmonary artery systolic  pressure.   4. Left atrial size was severely dilated.   5. Right atrial size was mildly dilated.   6. The mitral valve is normal in structure. Mild mitral valve  regurgitation.   7. The  aortic valve is tricuspid. Aortic valve regurgitation is not  visualized. Aortic valve  sclerosis/calcification is present, without any  evidence of aortic stenosis.   8. There is borderline dilatation of the aortic root, measuring 39 mm.  There is borderline dilatation of the ascending aorta, measuring 39 mm.   Patient Profile     61 y.o. male male with  history of CAD (known chronic occl of RCA with prior PCIs to LAD, diag, and LCx), VT, ICM,  OSA w/CPAP, seizures, chronic CHF, HLD, HTN, obesity, AFib, h/o VT, PVCs.   S/p R hemicolectomy 2/2 mass >> ca   Device information [employer] Dual Chamber ICD implanted 2005, 2010 and gen change for 2018    2017 to have slow VT prompting reduction of his VT zone rate to 150bpm.   Oct 2021, VT with appropriate therapies   He had stopped drinking (heavily at least).  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 Will d/w MD  2. CAD Stable by cath earlier this month No CP Neg HS Trops  3. Chronic CHF Has some edema, no SOB Lasix resumed yesterday Follow labs, volume  4. Paroxysmal Afib SR here Eliquis held for possible need of intervention/surgery on heparin  5. DVT Heparin gtt  6. Abdominal abscess 7. Sepsis 8.  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 10. Colon cancer  ADDEND: Dr.Alizey Noren has seen him Will transition to PO amiodarone 476m BID for now   For questions or updates, please contact [place] consult [email or web address] for contact info under        Signed, RBaldwin Jamaica PA-C  05/20/2022, 7:11 AM    Recurrent but slower ventricular tachycardia.  We will continue the amiodarone and mexiletine and will try again to transition from IV--oral amiodarone.  Has been on IV heparin for thromboembolic risk reduction following surgery.  Per surgery is okay to resume Eliquis.  He remains volume overloaded.  Continue his furosemide.

## 2022-05-20 NOTE — Progress Notes (Signed)
[place] for heparin (apixaban on hold) Indication: DVT, afib  No Known Allergies  Patient Measurements: Height: '6\' 6"'$  (198.1 cm) Weight: 102.1 kg (225 lb 1.4 oz) IBW/kg (Calculated) : 91.4 Heparin Dosing Weight: 102kg  Vital Signs: Temp: 97.6 F (36.4 C) (06/06 0457) Temp Source: Oral (06/06 0457) BP: 125/90 (06/06 0457) Pulse Rate: 71 (06/06 0457)  Labs: Recent Labs    05/18/22 0317 05/19/22 0554 05/20/22 0415  HGB 9.5* 9.1*  --   HCT 31.9* 30.2*  --   PLT 287 286  --   HEPARINUNFRC 0.33 0.39 0.40  CREATININE 0.63 0.69  --      Estimated Creatinine Clearance: 122.2 mL/min (by C-G formula based on SCr of 0.69 mg/dL).   Medical History: Past Medical History:  Diagnosis Date   AICD (automatic cardioverter/defibrillator) present 2005   CAD (coronary artery disease) 12/01/2013   Chronic combined systolic and diastolic CHF, NYHA class 1 ([place]) 12/01/2013   Erectile dysfunction 12/01/2013   HTN (hypertension) 12/01/2013   Hyperlipidemia 12/01/2013   Ischemic cardiomyopathy 12/01/2013   Presence of permanent cardiac pacemaker    Sleep apnea     Assessment: Pt was on apixaban prior to admission for AF. He was admitted for recurrent ICD shock and ileus. He is s/p colectomy for adenocarcinoma. Doppler showed brachial DVT today. Apixaban has been on hold (last dose 5/30) and CCS is ok with heparin.   Heparin level therapeutic at 0.40.  Goal of Therapy:  Heparin level 0.3-0.7 units/ml aPTT 66-102 secs Monitor platelets by anticoagulation protocol: Yes   Plan:  Continue IV heparin gtt at 2100 units/hr Daily heparin level and CBC Monitor for signs and symptoms of bleeding  Arrie Senate, PharmD, BCPS, [facility] Clinical Pharmacist [phone] Please check AMION for all [facility] numbers 05/20/2022

## 2022-05-20 NOTE — PMR Pre-admission (Signed)
PMR Admission Coordinator Pre-Admission Assessment  Patient: Gregory [name] is an 61 y.o., male MRN: [id number] DOB: August 06, 1961 Height: _0  (198.1 cm).  Weight: 102.1 kg  Insurance Information HMO:     PPO:      PCP:      IPA:      80/20:      OTHER:  PRIMARY: Workers compensation      Policy#: claim # [id number]      Subscriber: pt CM Name: Jari Favre      Phone#: [phone]     Fax#: [phone] Pre-Cert#: claim # [id number] approved for 7 days ONLY  ( requested 2 weeks and allowed only 7 days) Would have to appeal for longer     Employer: [place], [place]; date of injury 05/29/2004 Benefits:  Phone #: [employer]     Name: 6/6 Eff. Date: 05/29/2004     Deduct:       Out of Pocket Max:       Life Max:  CIR: per workers compensation guidelines      SNF: per [facility]: per WC     Co-Pay:  Home Health: per [facility]      Co-Pay:  DME: per WC     Co-Pay:  Providers: in network with WC  SECONDARY: [employer]      Policy#: [id number]  Financial Counselor:       Phone#:   The "Data Collection Information Summary" for patients in Inpatient Rehabilitation Facilities with attached "Privacy Act [place] Records" was provided and verbally reviewed with: N/A  Emergency Contact Information Contact Information     Name Relation Home Work Mobile   Tyler Daughter   [phone]   RAYVON, DAKIN   [phone]   [facility] Mother [phone]     Hummel,[name] Brother [phone]     Sheena, Simonis Daughter   [phone]   Helmut Muster   [phone]      Current Medical History  Patient Admitting Diagnosis: Debility, ventricular tachycardia  History of Present Illness: 61  year old male with history orf CAD s/p stenting, Chronic systolic Heart failure, recurrent VT with ICD placement, atrial fibrillation, type 2 DM, HTN, seizures who presented on 05/08/22 to ED after his ICD gave him a shock..He has had recent  surgery for partially obstructing colonic mass  for colon cancer , colon adenocarcinoma, moderately to poorly differentiated . Metastatic involving 11 of 35 lymph nodes. During previous admit had cardiac cath on 5/4 and s/p lap right colectomy 5/5. Multiple blood transfusions.   Cardiology/EP consulted and felt VT storm felt likely provoked by acute abdominal process. Placed on amio IV and Mexiletine. Has some leg edema and began diuresis with lasix. Continue Farxiga. Held Metoprolol and Entresto due to hypotension.  Well controlled type 2 DM. Continue Keppra for seizures. CPAP at HS.     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 Held Eliquis/plavix and underwent percutaneous drain and embolization of the splenic artery. Placed on Unasyn with plans to transition to Augmentin at discharge per ID consultation. Venous doppler revealed DVT of the left brachial vein. Transitioned from IV heparin back to Eliquis.   Plan for oncology follow up outpatient for PET scan and to discuss treatment options.   Patient's medical record from [facility] has been reviewed by the rehabilitation admission coordinator and physician.  Past Medical History  Past Medical History:  Diagnosis Date   AICD (  automatic cardioverter/defibrillator) present 2005   CAD (coronary artery disease) 12/01/2013   Chronic combined systolic and diastolic CHF, NYHA class 1 ([place]) 12/01/2013   Erectile dysfunction 12/01/2013   HTN (hypertension) 12/01/2013   Hyperlipidemia 12/01/2013   Ischemic cardiomyopathy 12/01/2013   Presence of permanent cardiac pacemaker    Sleep apnea    Has the patient had major surgery during 100 days prior to admission? Yes  Family History   family history includes Brain cancer in his father; Heart disease in his mother; Hypertension in his daughter.  Current Medications  Current Facility-Administered Medications:     0.9 %  sodium chloride infusion, 250 mL, Intravenous, PRN, Orma Flaming, MD, Last Rate: 10 mL/hr at 05/19/22 2041, 250 mL at 05/19/22 2041   acetaminophen (TYLENOL) tablet 650 mg, 650 mg, Oral, Q6H PRN, 650 mg at 05/21/22 1146 **OR** acetaminophen (TYLENOL) suppository 650 mg, 650 mg, Rectal, Q6H PRN, Orma Flaming, MD   amiodarone (PACERONE) tablet 400 mg, 400 mg, Oral, BID, Baldwin Jamaica, PA-C, 400 mg at 05/21/22 2049   Ampicillin-Sulbactam (UNASYN) 3 g in sodium chloride 0.9 % 100 mL IVPB, 3 g, Intravenous, Q6H, Shawna Clamp, MD, Last Rate: 200 mL/hr at 05/22/22 0611, 3 g at 05/22/22 1700   apixaban (ELIQUIS) tablet 5 mg, 5 mg, Oral, BID, [name], MD, 5 mg at 05/21/22 2050   atorvastatin (LIPITOR) tablet 80 mg, 80 mg, Oral, QPM, Orma Flaming, MD, 80 mg at 05/21/22 1724   bisacodyl (DULCOLAX) suppository 10 mg, 10 mg, Rectal, Daily PRN, Meuth, Brooke A, PA-C   cyclobenzaprine (FLEXERIL) tablet 7.5 mg, 7.5 mg, Oral, TID PRN, Vance Gather B, MD, 7.5 mg at 05/22/22 0457   dapagliflozin propanediol (FARXIGA) tablet 10 mg, 10 mg, Oral, Daily, Orma Flaming, MD, 10 mg at 05/21/22 1749   diphenhydrAMINE-zinc acetate (BENADRYL) 2-0.1 % cream, , Topical, TID PRN, Meuth, Brooke A, PA-C   docusate sodium (COLACE) capsule 100 mg, 100 mg, Oral, BID, Rolm Bookbinder, MD, 100 mg at 05/21/22 2049   ezetimibe (ZETIA) tablet 10 mg, 10 mg, Oral, Daily, Orma Flaming, MD, 10 mg at 05/21/22 4496   feeding supplement (ENSURE ENLIVE / ENSURE PLUS) liquid 237 mL, 237 mL, Oral, BID BM, Meuth, Brooke A, PA-C, 237 mL at 05/21/22 1354   furosemide (LASIX) tablet 40 mg, 40 mg, Oral, BID, [name], MD, 40 mg at 05/22/22 0816   insulin aspart (novoLOG) injection 0-9 Units, 0-9 Units, Subcutaneous, TID WC, Orma Flaming, MD, 1 Units at 05/20/22 0758   levETIRAcetam (KEPPRA) tablet 500 mg, 500 mg, Oral, BID, Orma Flaming, MD, 500 mg at 05/21/22 2050   mexiletine (MEXITIL) capsule 150 mg, 150 mg,  Oral, Q8H, Vickie Epley, MD, 150 mg at 05/22/22 0606   oxyCODONE-acetaminophen (PERCOCET/ROXICET) 5-325 MG per tablet 1-2 tablet, 1-2 tablet, Oral, Q4H PRN, Little Ishikawa, MD, 2 tablet at 05/22/22 0410   pantoprazole (PROTONIX) EC tablet 40 mg, 40 mg, Oral, BID, Shawna Clamp, MD, 40 mg at 05/21/22 2048   polyethylene glycol (MIRALAX / GLYCOLAX) packet 17 g, 17 g, Oral, BID, Meuth, Brooke A, PA-C, 17 g at 05/18/22 0912   sodium chloride flush (NS) 0.9 % injection 3 mL, 3 mL, Intravenous, Q12H, Orma Flaming, MD, 3 mL at 05/21/22 2200   sodium chloride flush (NS) 0.9 % injection 3 mL, 3 mL, Intravenous, PRN, Orma Flaming, MD, 3 mL at 05/11/22 0155   sodium chloride flush (NS) 0.9 % injection 5 mL, 5 mL, Intracatheter, Q8H, Suttle,  at 05/21/22 2200   sodium chloride flush (NS) 0.9 % injection 3 mL, 3 mL, Intravenous, PRN, Wolfe, Allison, MD, 3 mL at 05/11/22 0155   sodium chloride flush (NS) 0.9 % injection 5 mL, 5 mL, Intracatheter, Q8H, Suttle, Dylan J, MD, 5 mL at 05/22/22 0612   sodium phosphate (FLEET) 7-19 GM/118ML enema 1 enema, 1 enema, Rectal, Daily PRN, Kinsinger, Luke Aaron, MD   Patients Current Diet:  Diet Order                  Diet regular Room service appropriate? Yes; Fluid consistency: Thin  Diet effective now                       Precautions / Restrictions Precautions Precautions: Fall Precaution Comments: watch O2 and HR, jp drain Restrictions Weight Bearing Restrictions: No    Has the patient had 2 or more falls or a fall with injury in the past year? No   Prior Activity Level Community (5-7x/wk): reports independence   Prior Functional Level Self Care: Did the patient need help bathing, dressing, using the toilet or eating? Independent   Indoor Mobility: Did the patient need assistance with walking from room to room (with or without device)? Independent   Stairs: Did the patient need assistance with internal or external stairs (with or without device)? Independent   Functional Cognition: Did the patient need help planning regular tasks such as shopping or remembering to take medications? Independent    Patient Information Are you of Hispanic, Latino/a,or Spanish origin?: A. No, not of Hispanic, Latino/a, or Spanish origin What is your race?: B. Black or African American Do you need or want an interpreter to communicate with a doctor or health care staff?: 0. No   Patient's Response To:  Health Literacy and Transportation Is the patient able to respond to health literacy and transportation needs?: Yes Health Literacy - How often do you need to have someone help you when you read instructions, pamphlets, or other written material from your doctor or pharmacy?: Never In the past 12 months, has lack of transportation kept you from medical appointments or from getting medications?: No In the past 12 months, has lack of transportation kept you from meetings, work, or from getting things needed for daily living?: No   Home Assistive Devices / Equipment Home Assistive Devices/Equipment: CPAP Home Equipment: Shower seat, Hand held shower head   Prior Device Use: Indicate devices/aids used by the patient prior to current illness, exacerbation or injury? None of the above   Current Functional Level Cognition   Overall Cognitive Status: Within Functional Limits for tasks assessed Orientation Level: Oriented X4 General Comments: anxious over pain and heart palpatations    Extremity Assessment (includes Sensation/Coordination)   Upper Extremity Assessment: Overall WFL for tasks assessed (L forearm swollen likely due to IV)  Lower Extremity Assessment: Defer to PT evaluation     ADLs   Overall ADL's : Needs assistance/impaired Eating/Feeding: Independent Grooming: Wash/dry hands, Wash/dry face, Minimal assistance, Standing Grooming Details (indicate cue type and reason): stood at sink Upper Body Bathing: Set up, Sitting Lower Body Bathing: Minimal assistance, Sit to/from stand Upper Body Dressing : Set up, Sitting Lower Body Dressing: Minimal assistance, Sit to/from stand Lower Body  Dressing Details (indicate cue type and reason): able to bring feet to self for sock mgmt with increased effort. reports typically propping foot up on bedframe at home and bedrail here. discussed easier clothing to manage   and face hygiene    Mobility  Overal bed mobility: Needs Assistance Bed Mobility: Rolling, Sit to Sidelying Rolling: Supervision Sidelying to sit: Min assist, HOB elevated Supine to sit: Min assist Sit to supine: Min assist Sit to sidelying: Min assist General bed mobility comments: required assistance with BLEs to get back to bed    Transfers  Overall transfer level: Needs assistance Equipment used: Rolling walker (2 wheels) Transfers: Sit to/from Stand, Bed to chair/wheelchair/BSC Sit to Stand: Min guard, Min assist Bed to/from chair/wheelchair/BSC transfer type:: Stand pivot Stand pivot transfers: Min guard General transfer comment: patient min assist to stand from recliner and min guard to min assist to stand from [facility] and EOB    Ambulation / Gait / Stairs / Wheelchair Mobility  Ambulation/Gait Ambulation/Gait assistance: Herbalist (Feet): 30 Feet Assistive device: Rolling walker (2 wheels) Gait Pattern/deviations: Step-through pattern, Decreased stride length, Trunk flexed General Gait Details: cues for posture and proximity to RW. Pt walked 30' x 2 then additional 15' all with seated rest between trials Gait velocity: decreased Gait velocity interpretation: 1.31 - 2.62 ft/sec, indicative of limited community ambulator Pre-gait activities:  sidesteps x3 to his R side using RW, shuffled    Posture / Balance Dynamic Sitting Balance Sitting balance - Comments: sitting on EOB without assitance Balance Overall balance assessment: Needs assistance Sitting-balance support: No upper extremity supported, Feet supported Sitting balance-Leahy Scale: Good Sitting balance - Comments: sitting on EOB without assitance Standing balance support: Bilateral upper extremity supported, No upper extremity supported, During functional activity Standing balance-Leahy Scale: Poor Standing balance comment: able to stand at sink for hand hygiene, one extremity support to wash face    Special needs/care consideration AICD Seizure precautions IR drain and recent surgical wound CPAP at HS   Previous Home Environment  Living Arrangements:  (adult son lives with him)  Lives With: Son Available Help at Discharge:  (son works from home) Type of Home: House Home Layout: Two level, Bed/bath upstairs, 1/2 bath on main level Alternate Level Stairs-Rails: Right, Left, Can reach both Alternate Level Stairs-Number of Steps: 14 Home Access: Level entry Bathroom Shower/Tub: Chiropodist: Standard Bathroom Accessibility: Yes How Accessible: Accessible via walker Home Care Services: No Additional Comments: pt states not needing DME, son assists with driving and errands  Discharge Living Setting Plans for Discharge Living Setting: Patient's home, Lives with (comment) (adult son) Type of Home at Discharge: House Discharge Home Layout: Two level, Bed/bath upstairs, 1/2 bath on main level Alternate Level Stairs-Rails: Right, Left, Can reach both Alternate Level Stairs-Number of Steps: 14 Discharge Home Access: Level entry Discharge Bathroom Shower/Tub: Tub/shower unit Discharge Bathroom Toilet: Standard Discharge Bathroom Accessibility: Yes How Accessible: Accessible via walker Does the patient have any problems obtaining your  medications?: No  Social/Family/Support Systems Patient Roles: Parent Contact Information: daughter and son Anticipated Caregiver: adult son in the home Anticipated Caregiver's Contact Information: see contacts Ability/Limitations of Caregiver: none Caregiver Availability: 24/7 Discharge Plan Discussed with Primary Caregiver: Yes Is Caregiver In Agreement with Plan?: Yes Does Caregiver/Family have Issues with Lodging/Transportation while Pt is in Rehab?: No  Goals Patient/Family Goal for Rehab: Mod I to intermittent supervision with PT and OT Expected length of stay: ELOS 10 to 12 days Additional Information: Workers compensation due to cardiac issues 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 I met with patient at the bedside for inpatient rehabilitation assessment.  Patient will benefit from ongoing PT and OT, can actively participate in 3 hours of therapy a day 5 days of the week, and can make measurable gains during the admission.  Patient will also benefit from the coordinated team approach during an Inpatient Acute Rehabilitation admission.  The patient will receive intensive therapy as well as Rehabilitation physician, nursing, social worker, and care management interventions.  Due to bladder management, bowel management, safety, skin/wound care, disease management, medication administration, pain management, and patient education the patient requires 24 hour a day rehabilitation nursing.  The patient is currently min assist with mobility and basic ADLs.  Discharge setting and therapy post discharge at home with home  health is anticipated.  Patient has agreed to participate in the Acute Inpatient Rehabilitation Program and will admit today.  Preadmission Screen Completed By: Danne Baxter RN MSN with updates by Michel Santee, 05/22/2022 10:10 AM ______________________________________________________________________   Discussed status with Dr. Naaman Plummer on 05/22/22  at 10:10 AM  and received approval for admission today.  Admission Coordinator:  Danne Baxter RN MSN with updates by Michel Santee, PT, time 10:10 AM  Sudie Grumbling 05/22/22    Assessment/Plan: Diagnosis: debility Does the need for close, 24 hr/day Medical supervision in concert with the patient's rehab needs make it unreasonable for this patient to be served in a less intensive setting? Yes Co-Morbidities requiring supervision/potential complications: cad, csCHF, VT, afib, htn, dm Due to bladder management, bowel management, safety, skin/wound care, disease management, medication administration, pain management, and patient education, does the patient require 24 hr/day rehab nursing? Yes Does the patient require coordinated care of a physician, rehab nurse, PT, OT, and SLP to address physical and functional deficits in the context of the above medical diagnosis(es)? Yes Addressing deficits in the following areas: balance, endurance, locomotion, strength, transferring, bowel/bladder control, bathing, dressing, feeding, grooming, toileting, and psychosocial support Can the patient actively participate in an intensive therapy program of at least 3 hrs of therapy 5 days a week? Yes The potential for patient to make measurable gains while on inpatient rehab is excellent Anticipated functional outcomes upon discharge from inpatient rehab: modified independent PT, modified independent OT, n/a SLP Estimated rehab length of stay to reach the above functional goals is: 10-12 days Anticipated discharge destination: Home 10. Overall Rehab/Functional Prognosis:  excellent   MD Signature: Meredith Staggers, MD, [place] Director Rehabilitation Services 05/22/2022

## 2022-05-20 NOTE — Progress Notes (Addendum)
Inpatient Rehabilitation Admissions Coordinator   I met with patient at bedside for rehab assessment. He states he told on admission that anything related to his heart is covered under his workers compensation since 2006. I contacted his Workers Comp Tourist information centre manager Deloris at phone [phone] and she verified that any admission related to cardiac issues are to be filed under workers comp. She will provide me the form for requesting AIR/CIR level rehab under workers compensation to begin authorization. I will then provide the needed information to admitting to file this admit under workers compensation.   I reviewed with patient goals and expectations of a possible CIR admit. He was previously with [place] and prefers CIR admit to SNF. I will begin Auth with workers compensation and follow up with acute care team on process.  Danne Baxter, RN, MSN Rehab Admissions Coordinator [phone] 05/20/2022 11:02 AM

## 2022-05-20 NOTE — Progress Notes (Signed)
Mobility Specialist Progress Note    05/20/22 1651  Mobility  Activity Ambulated with assistance in hallway  Level of Assistance Minimal assist, patient does 75% or more  Assistive Device Front wheel walker  Distance Ambulated (ft) 45 ft  Activity Response Tolerated well  $Mobility charge 1 Mobility   Pre-Mobility: 74 HR, 96% SpO2 Post-Mobility: 85 HR, 96% SpO2  Pt received in bed and agreeable. C/o some belly pain and legs starting to feel weak once in hall. Returned to sitting EOB. Completed entire activity on RA. Left sitting EOB with RN and NT present.   Hildred Alamin Mobility Specialist

## 2022-05-20 NOTE — Progress Notes (Signed)
PROGRESS NOTE    Gregory [name]  OYD:[id number] DOB: 1961/04/28 DOA: 05/08/2022 PCP: Patient, [name]   Brief Narrative:  61 years old male with PMH significant for CAD s/p stenting, chronic systolic heart failure, recurrent VT with ICD placement, atrial fibrillation, diabetes mellitus type 2, history of seizures, hypertension presented in the ED after his AICD gave him a shock.  Recent admission for partially obstructing abdominal mass s/p hemicolectomy(right) 04/18/22 - imaging here shows multiple abdominal lesions considered to be abscesses with pseudoaneurysm of the splenic artery. S/P perc drain and embolization respectively.  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 - S/P hand-assisted laparoscopic right colectomy on 04/18/22. Had post operative ileus during last hospital stay. - Sepsis criteria met at intake: Leukocytosis, known source, tachycardia - General surgery has been consulted. 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 - Held eliquis/plavix for IR/possible intervention -resume Eliquis as below given completion of planned procedures - Patient underwent successful percutaneous drain, and embolization of the splenic artery. - Continue Unasyn - per surgical wound cultures (multispecies - Strep  Anginosis, Ecoli) - transition to Augmentin at discharge per discussion with ID - Drain management per IR - Continue to decrease narcotics - DC q6h PRN IV dilaudid 05/20/22  Recurrent V.Tach /s/p AICD shock: - Secondary to sepsis as above - Patient with history of recurrent VT reports AICD firing, admitted after shock from device. - Patient denies any chest pain.  EKG showed normal sinus rhythm. - EP following - appreciate insight/recs -Continues to have episodes of V. tach with exertion per nursing staff, amiodarone drip ongoing, cardiology attempting to wean patient to p.o. amiodarone   Left upper extremity DVT, acute, POA: -Venous Doppler ultrasound revealed DVT of the left brachial vein. -Patient has history of recent metastatic colon cancer. -Transition back to Eliquis from IV heparin  Hypothyroidism:  - Free T4 wnl - Would repeat levels in 1-2 weeks outpatient once acute illness has subsided   Cancer of right colon ([place]) - Recently diagnosed at previous hospitalization. 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  - He has follow up with oncology outpatient for PET scan - plan to discuss treatment options at that time.   Chronic combined systolic and diastolic CHF: - Echo 6/72/0947: LVEF 35-40%, slightly worse compared to 09/22/2020 (LVEF 40-45%).  - Clinically euvolemic.  Continue farxiga - Metoprolol XL and Entresto is on hold with soft bp/hypotension.   Paroxysmal atrial fibrillation (HCC) - Patient remains in normal sinus rhythm now. - Continue amiodarone IV for now,  change to p.o. once acute infection improves or controlled. - Eliquis resumed   CAD S/P percutaneous coronary angioplasty - S/p LHC on 04/17/22 recommendations:  Anatomy is similar to prior with the following exceptions: 50% to 70%  stenosis proximal to the large first obtuse marginal.  40 to 50% stenosis proximal to the stent segment in the first diagonal. - He denies any chest pain, troponin mildly elevated at intake likely 2/2 above ICD firing. - Continue medical management.  Resume Plavix tomorrow.   Type 2 diabetes without complication. - Hb R7N 5.1.  Well-controlled. - Continue farxiga for CHF - SSI and accuchecks per protocol.    Essential hypertension - Borderline hypotension at intake improving - Cardiology following - appreciate insight/recs - Continue to hold home entrestro, toprol-xl and lasix - no need for pressors   Seizures ([place]) Continue keppra. Seizure precautions    OSA on CPAP Uses cpap at night  Continue qhs.   Left upper extremity and lower extremity swelling: Venous duplex negative for DVT Upper extremity positive for DVT (left brachial vein) Resume Eliquis  DVT prophylaxis: Eliquis Code Status: Full Family Communication: None present  Status is: Inpatient  Dispo: The patient is from: Home              Anticipated d/c is to: To be determined -CIR versus SNF              Anticipated d/c date is: 43 to 72 hours pending clinical course, need for further imaging, intervention and ability to control infection with current antibiotic regimen.              Patient currently not medically stable for discharge  Consultants:  Cardiology, general surgery  Antimicrobials:  Unasyn  Subjective: No acute issues or events overnight denies nausea vomiting diarrhea constipation headache fevers or chills  Objective: Vitals:   05/19/22 1216 05/19/22 1330 05/19/22 2049 05/20/22 0457  BP:  (!) 125/94 123/79 125/90  Pulse: 88  80 71  Resp:  '18 18 19  ' Temp:  98.2 F (36.8 C) 98.3 F (36.8 C) 97.6 F (36.4 C)  TempSrc:  Oral Oral Oral  SpO2: 95%  (!) 88% 91%  Weight:      Height:        Intake/Output Summary (Last 24 hours) at 05/20/2022 0807 Last data filed at 05/20/2022 0437 Gross per  24 hour  Intake 1323.6 ml  Output 1441 ml  Net -117.4 ml    Filed Weights   05/08/22 0841 05/08/22 1626  Weight: 95.7 kg 102.1 kg    Examination:  General:  Pleasantly resting in bed, No acute distress. Lungs:  Clear to auscultate bilaterally without rhonchi, wheeze, or rales. Heart:  Regular rate and rhythm.  Without murmurs, rubs, or gallops. Abdomen:  Soft, nontender, nondistended.  Without guarding or rebound. Extremities: Without cyanosis, clubbing, left upper extremity edema, 1+  Data Reviewed: I have personally reviewed following labs and imaging studies  CBC: Recent Labs  Lab 05/15/22 0229 05/16/22 0619 05/17/22 0708 05/18/22 0317 05/19/22 0554  WBC 14.0* 14.1* 14.7* 12.0* 11.4*  HGB 10.2* 9.6* 9.1* 9.5* 9.1*  HCT 35.9* 31.6* 30.6* 31.9* 30.2*  MCV 87.8 83.4 83.6 82.6 83.2  PLT 327 307 308 287 165    Basic Metabolic Panel: Recent Labs  Lab 05/14/22 0206 05/15/22 0229 05/15/22 0730 05/17/22 0708 05/18/22 0317 05/19/22 0554 05/20/22 0415  NA 130* 133*  --  135 135 135 136  K 4.4 4.2  --  4.1 4.0 3.9 3.7  CL 99 102  --  101 100 104 104  CO2 26 24  --  '28 29 28 28  ' GLUCOSE 109* 135*  --  106* 104* 91 94  BUN 5*  6*  --  7* 7* 7* 8  CREATININE 0.74 0.65  --  0.68 0.63 0.69 0.75  CALCIUM 7.7* 7.5*  --  7.7* 7.8* 7.7* 7.7*  MG 2.1  --  2.1 2.0 2.1 2.1 2.1  PHOS 2.3* 2.7  --   --   --   --   --     GFR: Estimated Creatinine Clearance: 122.2 mL/min (by C-G formula based on SCr of 0.75 mg/dL). Liver Function Tests: Recent Labs  Lab 05/14/22 0206 05/15/22 0229  AST 69* 50*  ALT 55* 47*  ALKPHOS 164* 138*  BILITOT 0.2* 0.2*  PROT 5.6* 5.4*  ALBUMIN <1.5* <1.5*    No results for input(s): LIPASE, AMYLASE in the last 168 hours. No results for input(s): AMMONIA in the last 168 hours. Coagulation Profile: No results for input(s): INR, PROTIME in the last 168 hours.  Cardiac Enzymes: No results for input(s): CKTOTAL, CKMB, CKMBINDEX, TROPONINI in  the last 168 hours. BNP (last 3 results) No results for input(s): PROBNP in the last 8760 hours. HbA1C: No results for input(s): HGBA1C in the last 72 hours. CBG: Recent Labs  Lab 05/19/22 1205 05/19/22 1350 05/19/22 1704 05/19/22 2135 05/20/22 0802  GLUCAP 89 89 80 135* 92    Lipid Profile: No results for input(s): CHOL, HDL, LDLCALC, TRIG, CHOLHDL, LDLDIRECT in the last 72 hours. Thyroid Function Tests: No results for input(s): TSH, T4TOTAL, FREET4, T3FREE, THYROIDAB in the last 72 hours. Anemia Panel: No results for input(s): VITAMINB12, FOLATE, FERRITIN, TIBC, IRON, RETICCTPCT in the last 72 hours. Sepsis Labs: No results for input(s): PROCALCITON, LATICACIDVEN in the last 168 hours.  No results found for this or any previous visit (from the past 240 hour(s)).  Radiology Studies: No results found.  Scheduled Meds:  atorvastatin  80 mg Oral QPM   dapagliflozin propanediol  10 mg Oral Daily   docusate sodium  100 mg Oral BID   ezetimibe  10 mg Oral Daily   feeding supplement  237 mL Oral BID BM   furosemide  40 mg Oral BID   insulin aspart  0-9 Units Subcutaneous TID WC   levETIRAcetam  500 mg Oral BID   mexiletine  150 mg Oral Q8H   pantoprazole  40 mg Oral BID   polyethylene glycol  17 g Oral BID   sodium chloride flush  3 mL Intravenous Q12H   sodium chloride flush  5 mL Intracatheter Q8H   Continuous Infusions:  sodium chloride 250 mL (05/19/22 2041)   amiodarone 30 mg/hr (05/20/22 0436)   ampicillin-sulbactam (UNASYN) IV 3 g (05/20/22 0437)   heparin 2,100 Units/hr (05/20/22 0313)     LOS: 12 days   Time spent: 13mn  Millie Shorb C Dolphus Linch, DO [facility]  If 7PM-7AM, please contact night-coverage [email or web address]  05/20/2022, 8:07 AM

## 2022-05-21 DIAGNOSIS — I2510 Atherosclerotic heart disease of native coronary artery without angina pectoris: Secondary | ICD-10-CM

## 2022-05-21 DIAGNOSIS — E039 Hypothyroidism, unspecified: Secondary | ICD-10-CM

## 2022-05-21 DIAGNOSIS — Z9989 Dependence on other enabling machines and devices: Secondary | ICD-10-CM

## 2022-05-21 DIAGNOSIS — I480 Paroxysmal atrial fibrillation: Secondary | ICD-10-CM

## 2022-05-21 DIAGNOSIS — R569 Unspecified convulsions: Secondary | ICD-10-CM

## 2022-05-21 DIAGNOSIS — Z9861 Coronary angioplasty status: Secondary | ICD-10-CM

## 2022-05-21 DIAGNOSIS — D72829 Elevated white blood cell count, unspecified: Secondary | ICD-10-CM

## 2022-05-21 DIAGNOSIS — E119 Type 2 diabetes mellitus without complications: Secondary | ICD-10-CM

## 2022-05-21 DIAGNOSIS — K9189 Other postprocedural complications and disorders of digestive system: Secondary | ICD-10-CM

## 2022-05-21 DIAGNOSIS — C182 Malignant neoplasm of ascending colon: Secondary | ICD-10-CM

## 2022-05-21 DIAGNOSIS — K567 Ileus, unspecified: Secondary | ICD-10-CM

## 2022-05-21 DIAGNOSIS — G4733 Obstructive sleep apnea (adult) (pediatric): Secondary | ICD-10-CM

## 2022-05-21 DIAGNOSIS — T8143XA Infection following a procedure, organ and space surgical site, initial encounter: Principal | ICD-10-CM

## 2022-05-21 DIAGNOSIS — I10 Essential (primary) hypertension: Secondary | ICD-10-CM

## 2022-05-21 LAB — BASIC METABOLIC PANEL
Anion gap: 9 (ref 5–15)
BUN: 12 mg/dL (ref 8–23)
CO2: 25 mmol/L (ref 22–32)
Calcium: 7.8 mg/dL — ABNORMAL LOW (ref 8.9–10.3)
Chloride: 101 mmol/L (ref 98–111)
Creatinine, Ser: 0.89 mg/dL (ref 0.61–1.24)
GFR, Estimated: >60 mL/min (ref >60)
Glucose, Bld: 102 mg/dL — ABNORMAL HIGH (ref 70–99)
Potassium: 4.4 mmol/L (ref 3.5–5.1)
Sodium: 135 mmol/L (ref 135–145)

## 2022-05-21 LAB — CBC
HCT: 36.4 % — ABNORMAL LOW (ref 39.0–52.0)
Hemoglobin: 10.8 g/dL — ABNORMAL LOW (ref 13.0–17.0)
MCH: 25.2 pg — ABNORMAL LOW (ref 26.0–34.0)
MCHC: 29.7 g/dL — ABNORMAL LOW (ref 30.0–36.0)
MCV: 84.8 fL (ref 80.0–100.0)
Platelets: 255 10*3/uL (ref 150–400)
RBC: 4.29 MIL/uL (ref 4.22–5.81)
RDW: 23.5 % — ABNORMAL HIGH (ref 11.5–15.5)
WBC: 10.2 10*3/uL (ref 4.0–10.5)
nRBC: 0 % (ref 0.0–0.2)

## 2022-05-21 LAB — GLUCOSE, CAPILLARY
Glucose-Capillary: 94 mg/dL (ref 70–99)
Glucose-Capillary: 125 mg/dL — ABNORMAL HIGH (ref 70–99)
Glucose-Capillary: 102 mg/dL — ABNORMAL HIGH (ref 70–99)
Glucose-Capillary: 96 mg/dL (ref 70–99)

## 2022-05-21 LAB — MAGNESIUM: Magnesium: 2.2 mg/dL (ref 1.7–2.4)

## 2022-05-21 MED ORDER — CYCLOBENZAPRINE HCL 5 MG PO TABS
7.5000 mg | ORAL_TABLET | Freq: Three times a day (TID) | ORAL | Status: DC | PRN
Start: 2022-05-21 — End: 2022-05-22
  Administered 2022-05-21 – 2022-05-22 (×4): 7.5 mg via ORAL
  Filled 2022-05-21 (×10): qty 1.5

## 2022-05-21 NOTE — Consult Note (Signed)
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 male with CAD s/p pci, AICD, afib, T2DM hx of right colon mass who underwent laparoscopic right colon resection on 5/5. Path consistent with metastatic adenoca with +11/35 LN. His course complicated with post op abscess and splenic artery pseudoaneurysm requiring IR drainage on 5/26. Cx polymicrobial with strep anginosus and e.coli. he has been on amp/sub. He had IR drain exchanged and repositioned on 5/31. IR did embolization of the splenic artery pseudoaneursym. The drain is still having large output of 267m. His leukocytosis has trended down from 22.5 (5/31) to 10.2 ( June 7th)  Imaging from 5/31: 25 x 15 cm collection in right mid abdomen. And another fluid collection is 15 x 7.7cm. the left lateral fluid collection 16 x 6 cm.   Past Medical History:  Diagnosis Date   AICD (automatic cardioverter/defibrillator) present 2005   CAD (coronary artery disease) 12/01/2013   Chronic combined systolic and diastolic CHF, NYHA class 1 ([place] 12/01/2013   Erectile dysfunction 12/01/2013   HTN (hypertension) 12/01/2013   Hyperlipidemia 12/01/2013   Ischemic cardiomyopathy 12/01/2013   Presence of permanent cardiac pacemaker    Sleep apnea     Allergies: No Known  Allergies  MEDICATIONS:  amiodarone  400 mg Oral BID   apixaban  5 mg Oral BID   atorvastatin  80 mg Oral QPM   dapagliflozin propanediol  10 mg Oral Daily   docusate sodium  100 mg Oral BID   ezetimibe  10 mg Oral Daily   feeding supplement  237 mL Oral BID BM   furosemide  40 mg Oral BID   insulin aspart  0-9 Units Subcutaneous TID WC   levETIRAcetam  500 mg Oral BID   mexiletine  150 mg Oral Q8H   pantoprazole  40 mg Oral BID   polyethylene glycol  17 g Oral BID   sodium chloride flush  3 mL Intravenous Q12H   sodium chloride flush  5 mL Intracatheter Q8H    Social History   Tobacco Use   Smoking status: Former    Packs/day: 1.00    Years: 30.00    Pack years: 30.00    Types: Cigarettes    Quit date: 05/31/2013    Years since quitting: 8.9   Smokeless tobacco: Never  Vaping Use   Vaping Use: Never used  Substance Use Topics   Alcohol use: Not Currently    Comment: 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  HENT: Negative for congestion, sore throat, rhinorrhea, sneezing, trouble swallowing and sinus pressure.  Eyes: Negative for photophobia and visual disturbance.  Respiratory: Negative for cough, chest tightness, shortness of breath, wheezing and stridor.  Cardiovascular: Negative for chest pain, palpitations and leg swelling.  Gastrointestinal: + abdominal pain. Negative for nausea, vomiting, abdominal pain, diarrhea, constipation, blood in stool, abdominal distention and anal bleeding.  Genitourinary: Negative for dysuria, hematuria, flank pain and difficulty urinating.  Musculoskeletal: Negative for myalgias, back pain, joint swelling, arthralgias and  gait problem.  Skin: Negative for color change, pallor, rash and wound.  Neurological: Negative for dizziness, tremors, weakness and light-headedness.  Hematological: Negative for adenopathy. Does not bruise/bleed easily.  Psychiatric/Behavioral: Negative for behavioral problems, confusion, sleep disturbance, dysphoric mood, decreased concentration and agitation.    OBJECTIVE: Temp:  [97.7 F (36.5 C)-98 F (36.7 C)] 97.9 F (36.6 C) (06/07 1331) Pulse Rate:  [69-87] 72 (06/07 1333) Resp:  [15-18] 15 (06/07 1331) BP: (123-147)/(81-99) 127/81 (06/07 1333) SpO2:  [86 %-98 %] 86 % (06/07 1333) Physical Exam  Constitutional: He is oriented to person, place, and time. He appears well-developed and well-nourished. No distress.  HENT:  Mouth/Throat: Oropharynx is clear and moist. No oropharyngeal exudate.  Cardiovascular: Normal rate, regular rhythm and normal heart sounds. Exam reveals no gallop and no friction rub.  No murmur heard.  Pulmonary/Chest: Effort normal and breath sounds normal. No respiratory distress. He has no wheezes.  Abdominal: Soft. Bowel sounds are normal. He exhibits no distension. There is no tenderness. +right drain = milky fluid  Lymphadenopathy:  He has no cervical adenopathy.  Neurological: He is alert and oriented to person, place, and time.  Skin: Skin is warm and dry. No rash noted. No erythema.  Psychiatric: He has a normal mood and affect. His behavior is normal.   LABS: Results for orders placed or performed during the hospital encounter of 05/08/22 (from the past 48 hour(s))  Glucose, capillary     Status: None   Collection Time: 05/19/22  5:04 PM  Result Value Ref Range   Glucose-Capillary 80 70 - 99 mg/dL    Comment: Glucose reference range applies only to samples taken after fasting for at least 8 hours.  Glucose, capillary     Status: Abnormal   Collection Time: 05/19/22  9:35 PM  Result Value Ref Range   Glucose-Capillary 135 (H) 70 - 99 mg/dL     Comment: Glucose reference range applies only to samples taken after fasting for at least 8 hours.  Magnesium     Status: None   Collection Time: 05/20/22  4:15 AM  Result Value Ref Range   Magnesium 2.1 1.7 - 2.4 mg/dL    Comment: Performed at [facility] [address] [place], [place] [address]  Heparin level (unfractionated)     Status: None   Collection Time: 05/20/22  4:15 AM  Result Value Ref Range   Heparin Unfractionated 0.40 0.30 - 0.70 IU/mL    Comment: (NOTE) The clinical reportable range upper limit is being lowered to >1.10 to align with the FDA approved guidance for the current laboratory assay.  If heparin results are below expected values, and patient dosage has  been confirmed, suggest follow up testing of antithrombin III levels. Performed at [facility], [place] [address] [place], [place] [address]   Basic metabolic panel     Status: Abnormal   Collection Time: 05/20/22  4:15 AM  Result Value Ref  Range   Sodium 136 135 - 145 mmol/L   Potassium 3.7 3.5 - 5.1 mmol/L   Chloride 104 98 - 111 mmol/L   CO2 28 22 - 32 mmol/L   Glucose, Bld 94 70 - 99 mg/dL    Comment: Glucose reference range applies only to samples taken after fasting for at least 8 hours.   BUN 8 8 - 23 mg/dL   Creatinine, Ser 0.75 0.61 - 1.24 mg/dL   Calcium 7.7 (L) 8.9 - 10.3 mg/dL   GFR, Estimated >60 >60 mL/min    Comment: (NOTE) Calculated using the CKD-EPI Creatinine Equation (2021)    Anion gap 4 (L) 5 - 15    Comment: Performed at [facility] [address], [place], [place] [address]  Glucose, capillary     Status: None   Collection Time: 05/20/22  8:02 AM  Result Value Ref Range   Glucose-Capillary 92 70 - 99 mg/dL    Comment: Glucose reference range applies only to samples taken after fasting for at least 8 hours.  Glucose, capillary     Status: None   Collection Time: 05/20/22 11:47 AM  Result Value Ref Range   Glucose-Capillary 80 70 - 99 mg/dL     Comment: Glucose reference range applies only to samples taken after fasting for at least 8 hours.  Glucose, capillary     Status: Abnormal   Collection Time: 05/20/22  4:50 PM  Result Value Ref Range   Glucose-Capillary 112 (H) 70 - 99 mg/dL    Comment: Glucose reference range applies only to samples taken after fasting for at least 8 hours.  Glucose, capillary     Status: Abnormal   Collection Time: 05/20/22  9:23 PM  Result Value Ref Range   Glucose-Capillary 120 (H) 70 - 99 mg/dL    Comment: Glucose reference range applies only to samples taken after fasting for at least 8 hours.  Magnesium     Status: None   Collection Time: 05/21/22  5:55 AM  Result Value Ref Range   Magnesium 2.2 1.7 - 2.4 mg/dL    Comment: Performed at [facility] [address], [place], [place] [address]  Basic metabolic panel     Status: Abnormal   Collection Time: 05/21/22  5:55 AM  Result Value Ref Range   Sodium 135 135 - 145 mmol/L   Potassium 4.4 3.5 - 5.1 mmol/L   Chloride 101 98 - 111 mmol/L   CO2 25 22 - 32 mmol/L   Glucose, Bld 102 (H) 70 - 99 mg/dL    Comment: Glucose reference range applies only to samples taken after fasting for at least 8 hours.   BUN 12 8 - 23 mg/dL   Creatinine, Ser 0.89 0.61 - 1.24 mg/dL   Calcium 7.8 (L) 8.9 - 10.3 mg/dL   GFR, Estimated >60 >60 mL/min    Comment: (NOTE) Calculated using the CKD-EPI Creatinine Equation (2021)    Anion gap 9 5 - 15    Comment: Performed at [facility] [address], [place], [place] [address]  CBC     Status: Abnormal   Collection Time: 05/21/22  5:55 AM  Result Value Ref Range   WBC 10.2 4.0 - 10.5 K/uL   RBC 4.29 4.22 - 5.81 MIL/uL   Hemoglobin 10.8 (L) 13.0 - 17.0 g/dL   HCT 36.4 (L) 39.0 - 52.0 %   MCV 84.8 80.0 - 100.0 fL   MCH 25.2 (L)  26.0 - 34.0 pg   MCHC 29.7 (L) 30.0 - 36.0 g/dL   RDW 23.5 (H) 11.5 - 15.5 %   Platelets 255 150 - 400 K/uL   nRBC 0.0 0.0 - 0.2 %    Comment: Performed at [facility] [address], [place], [place] [address]  Glucose, capillary     Status: None   Collection Time: 05/21/22  8:01 AM  Result Value Ref Range   Glucose-Capillary 94 70 - 99 mg/dL    Comment: Glucose reference range applies only to samples taken after fasting for at least 8 hours.  Glucose, capillary     Status: Abnormal   Collection Time: 05/21/22 12:09 PM  Result Value Ref Range   Glucose-Capillary 125 (H) 70 - 99 mg/dL    Comment: Glucose reference range applies only to samples taken after fasting for at least 8 hours.    MICRO: 5/6 abscess --  Gram Stain ABUNDANT WBC PRESENT,BOTH PMN AND MONONUCLEAR  FEW GRAM POSITIVE COCCI IN CLUSTERS  FEW GRAM POSITIVE COCCI IN CHAINS  RARE GRAM NEGATIVE RODS   Culture ABUNDANT STREPTOCOCCUS ANGINOSIS  MODERATE ESCHERICHIA COLI  ABUNDANT BACTEROIDES FRAGILIS  BETA LACTAMASE POSITIVE  Performed at [facility], [place] [address] [place], [place] [address]   Report Status 05/13/2022 FINAL   Organism ID, Bacteria ESCHERICHIA COLI   Organism ID, Bacteria STREPTOCOCCUS ANGINOSIS    Escherichia coli Streptococcus anginosis      MIC MIC    AMPICILLIN 8 SENSITIVE  Sensitive      AMPICILLIN/SULBACTAM <=2 SENSITIVE  Sensitive      CEFAZOLIN <=4 SENSITIVE  Sensitive      CEFEPIME <=0.12 SENS... Sensitive      CEFTAZIDIME <=1 SENSITIVE  Sensitive      CEFTRIAXONE <=0.25 SENS... Sensitive <=0.12 SENS... Sensitive    CIPROFLOXACIN <=0.25 SENS... Sensitive      ERYTHROMYCIN   4 RESISTANT  Resistant    GENTAMICIN <=1 SENSITIVE  Sensitive      IMIPENEM <=0.25 SENS... Sensitive      LEVOFLOXACIN   0.5 SENSITIVE  Sensitive    PENICILLIN   <=0.06 SENS... Sensitive    PIP/TAZO <=4 SENSITIVE  Sensitive      TRIMETH/SULFA <=20 SENSIT... Sensitive      VANCOMYCIN   0.5 SENSITIVE  Sensitive    IMAGING: No results found.  Assessment/Plan:  61yo M with colon cancer s/p mass resection c/b SSI - with polymicrobial intra-abdominal infection -  continue with amp/sub - when patient is closer to discharge/snf, can switch to amox/clav '875mg'$  bid x 3-4 additional weeks, still has high output - would recommend to repeat abd CT towards end of hospitalization/( aim around 6/12--roughly 2 weeks from last study to reassess size of fluid collection) - will need follow up -- in drain clinic, general surgery, and ID clinic  Leukocytosis = improving while on abtx and source control  Back spasm = recommend trial of robaxin

## 2022-05-21 NOTE — Progress Notes (Deleted)
Gregory [name] was scheduled with Dr Burr Medico on 6/14 for PET scan f/u.  He has been an inpatient and was unable to have the PET scan.  Plan is for him to be admitted into inpt rehab.  I have cancelled his 6/14 appt with Dr Burr Medico.  I will review his case when she returns to determine next appt.  I left a vm for Gregory [name] explaining the above.

## 2022-05-21 NOTE — Progress Notes (Signed)
Chief Complaint/Subjective: No abdominal pain. Tolerating reg diet without n/v. Having flatus and Bms. Denies distention.    Objective: Vital signs in last 24 hours: Temp:  [97.5 F (36.4 C)-98 F (36.7 C)] 98 F (36.7 C) (06/07 0335) Pulse Rate:  [69-87] 72 (06/07 0335) Resp:  [16-18] 18 (06/07 0335) BP: (103-147)/(69-99) 140/99 (06/07 0335) SpO2:  [90 %-98 %] 98 % (06/07 0335) Last BM Date : 05/19/22 Intake/Output from previous day: 06/06 0701 - 06/07 0700 In: 410 [P.O.:400] Out: 1770 [Urine:1530; Drains:240] Intake/Output this shift: No intake/output data recorded.  PE: Gen: NAD Resp: nonlabored Card: normal rate Abd: soft, no distension, tan, milky output from drain, nontender; wounds c/d/I without erythema nor drainage  Lab Results:  Recent Labs    05/19/22 0554 05/21/22 0555  WBC 11.4* 10.2  HGB 9.1* 10.8*  HCT 30.2* 36.4*  PLT 286 255   BMET Recent Labs    05/20/22 0415 05/21/22 0555  NA 136 135  K 3.7 4.4  CL 104 101  CO2 28 25  GLUCOSE 94 102*  BUN 8 12  CREATININE 0.75 0.89  CALCIUM 7.7* 7.8*   PT/INR No results for input(s): LABPROT, INR in the last 72 hours. CMP     Component Value Date/Time   NA 135 05/21/2022 0555   NA 137 04/11/2022 1219   K 4.4 05/21/2022 0555   CL 101 05/21/2022 0555   CO2 25 05/21/2022 0555   GLUCOSE 102 (H) 05/21/2022 0555   BUN 12 05/21/2022 0555   BUN 13 04/11/2022 1219   CREATININE 0.89 05/21/2022 0555   CREATININE 1.08 12/12/2016 1445   CALCIUM 7.8 (L) 05/21/2022 0555   PROT 5.4 (L) 05/15/2022 0229   PROT 6.5 02/28/2022 1304   ALBUMIN <1.5 (L) 05/15/2022 0229   ALBUMIN 4.0 02/28/2022 1304   AST 50 (H) 05/15/2022 0229   ALT 47 (H) 05/15/2022 0229   ALKPHOS 138 (H) 05/15/2022 0229   BILITOT 0.2 (L) 05/15/2022 0229   BILITOT 0.2 02/28/2022 1304   GFRNONAA >60 05/21/2022 0555   GFRAA 91 11/15/2020 0959   Lipase     Component Value Date/Time   LIPASE 120 (H) 09/09/2020 1810     Studies/Results: No results found.  Anti-infectives: Anti-infectives (From admission, onward)    Start     Dose/Rate Route Frequency Ordered Stop   05/13/22 1800  Ampicillin-Sulbactam (UNASYN) 3 g in sodium chloride 0.9 % 100 mL IVPB        3 g 200 mL/hr over 30 Minutes Intravenous Every 6 hours 05/13/22 1136     05/08/22 2200  piperacillin-tazobactam (ZOSYN) IVPB 3.375 g  Status:  Discontinued        3.375 g 12.5 mL/hr over 240 Minutes Intravenous Every 8 hours 05/08/22 2008 05/13/22 1136       Assessment/Plan POD#31 s/p lap assisted R colectomy for adenocarcinoma, Dr. 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 Has seen Dr. 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 Does not need vaccines since selective embo. -s/p IR drain exchange and repositioning 5/31 -cont drain due to high output still. -tolerating diet -surgically stable.  Will see again Friday if remains  in house  Drain teaching - care order placed. Office follow-up being arranged as well     FEN - soft, miralax BID, Ensure VTE - Ok to resume plavix and eliquis from our standpoint ID - Zosyn 5/25>>5/30, unasyn 5/30>>   - Per TRH/Cards -  CAD with history of PCI to LAD Recurrent ventricular tachycardia status post AICD placement, three episodes last PM as long as 33m Cardiology was notified, expected given underlying illness and tolerating episodes.  Ischemic cardiomyopathy CHF, systolic and diastolic, LV EF 35 to 444%Atrial fibrillation    LOS: 13 days   [facility] 05/21/2022, 10:13  AM Please see Amion for pager number during day hours 7:00am-4:30pm or 7:00am -11:30am on weekends

## 2022-05-21 NOTE — Progress Notes (Signed)
Gregory [name] was scheduled with Dr Burr Medico on 6/14 for PET scan f/u.  He has been an inpatient and was unable to have the PET scan.  Plan is for him to be admitted into inpt rehab.  I have cancelled his 6/14 appt with Dr Burr Medico.  I will review his case when she returns to determine next appt.  I left a vm for Gregory [name] explaining the above.

## 2022-05-21 NOTE — Progress Notes (Addendum)
Progress Note  Patient: Gregory [name] VVO:[id number] DOB: [birth year]  DOA: 05/08/2022  DOS: 05/21/2022    Brief hospital course: Gregory [name] is a 61 years old male with PMH significant for CAD s/p stenting, chronic systolic heart failure, recurrent VT with ICD placement, atrial fibrillation, diabetes mellitus type 2, history of seizures, hypertension presented in the ED after his AICD gave him a shock.   Recent admission for partially obstructing abdominal mass s/p hemicolectomy(right) 04/18/22 - imaging here shows multiple abdominal lesions considered to be abscesses with pseudoaneurysm of the splenic artery. S/P perc drain and embolization respectively.  Assessment and Plan: Intraabdominal abscesses postoperatively, POA: s/p IR perc drain 5/26, upsized 5/31, continued purulent output advised to increase frequency of flushes 6/5.  - Continue unasyn per ID with plans to repeat abd CT on/around 6/12, prior to discharge, and likely to discharge on 3-4 more weeks of augmentin.   - Needs follow up in IR drain clinic - Follow up with general surgery - Follow up with [facility] after discharge.    Recurrent V.Tach /s/p AICD shock: - Secondary to sepsis as above - Patient with history of recurrent VT reports AICD firing, admitted after shock from device. - EP advice appreciated, on amiodarone and mexiletine with improvement, though still intermittent.   Colon CA s/p right hemicolectomy 5/5: 11/35 nodes were positive; lymphovascular space invasion present; margins negative.  - Still planning PET as outpatient to inform oncologic treatment plan.   - F/u with Dr. Burr Medico. Per office visit 5/22: I reviewed that he has at least locally advanced cancer, if PET scan is negative for distant metastasis. I discussed that he has a high risk of recurrence (over 50%), the standard therapy and recommendation would be for adjuvant chemotherapy. I discussed the different regimens. Given his current  performance status and comorbidities, I recommend starting with oral Xeloda. If he improves, we can consider adding IV oxaliplatin, either FOLFOX or CAPOX.  Due to his significant cardiac comorbidities, if his performance status does not improve, he may not be a candidate for combined chemotherapy.  Chronic combined HFrEF, ICM: Echo 04/07/2022: LVEF 35-40%, worse compared to 09/22/2020 (LVEF 40-45%). Clinically euvolemic.  - Continue farxiga -holding metoprolol XL and Entresto with soft bp/hypotension  - Volume up, so giving lasix, monitor I/O, weights.   Paroxysmal atrial fibrillation  - Per EP   Left brachial acute DVT:  - Continue eliquis.   Hypothyroidism:  - Continue synthroid, recheck TFTs at follow up  Right paraspinal lumbar back spasm:  - Muscle relaxer prn  CAD s/p PCI: Stable by cath 04/17/2022, no angina, flat troponins.  - No changes per cardiology  NIDT2DM: HbA1c 5.1%, well-controlled.  - Continue SGLT2i. Monitor CBGs, sensitive SSI  OSA:  - Continue CPAP qHS  Seizure disorder:  - Continue keppra  HTN:  - As above   Subjective: Moved the wrong way in bed and feels significant lower back pain worse with some movements. No numbness or new weakness. Swelling in his legs is about stable. Having 1-2 loose bowel movements per day. Pain is controlled.  Objective: Vitals:   05/20/22 2304 05/21/22 0335 05/21/22 1331 05/21/22 1333  BP: (!) 147/99 (!) 140/99 127/81 127/81  Pulse: 87 72 72 72  Resp: '18 18 15   '$ Temp: 98 F (36.7 C) 98 F (36.7 C) 97.9 F (36.6 C)   TempSrc: Oral Oral Oral   SpO2: 93% 98% 91% (!) 86%  Weight:      Height:  Gen: Chronically ill-appearing 61 y.o. male in no distress Pulm: Nonlabored. Clear. CV: Regular, w/rate in 70's. No murmur, rub, or gallop. No JVD, 1+ dependent edema. GI: Abdomen soft, diffusely tender. Drain in position with significant purulent output, surrounding without significant erythema. Non-distended, with  normoactive bowel sounds.  Ext: Warm, no deformities.  Back: +right paraspinal spasm in lumbar region. No midline tenderness or stepoffs. Skin: No other rashes, lesions or ulcers on visualized skin. Neuro: Alert and oriented. No focal neurological deficits. Psych: Judgement and insight appear fair. Mood euthymic & affect congruent. Behavior is appropriate.    Data Personally reviewed: CBC: Recent Labs  Lab 05/16/22 0619 05/17/22 0708 05/18/22 0317 05/19/22 0554 05/21/22 0555  WBC 14.1* 14.7* 12.0* 11.4* 10.2  HGB 9.6* 9.1* 9.5* 9.1* 10.8*  HCT 31.6* 30.6* 31.9* 30.2* 36.4*  MCV 83.4 83.6 82.6 83.2 84.8  PLT 307 308 287 286 631   Basic Metabolic Panel: Recent Labs  Lab 05/15/22 0229 05/15/22 0730 05/17/22 0708 05/18/22 0317 05/19/22 0554 05/20/22 0415 05/21/22 0555  NA 133*  --  135 135 135 136 135  K 4.2  --  4.1 4.0 3.9 3.7 4.4  CL 102  --  101 100 104 104 101  CO2 24  --  '28 29 28 28 25  '$ GLUCOSE 135*  --  106* 104* 91 94 102*  BUN 6*  --  7* 7* 7* 8 12  CREATININE 0.65  --  0.68 0.63 0.69 0.75 0.89  CALCIUM 7.5*  --  7.7* 7.8* 7.7* 7.7* 7.8*  MG  --    < > 2.0 2.1 2.1 2.1 2.2  PHOS 2.7  --   --   --   --   --   --    < > = values in this interval not displayed.   GFR: Estimated Creatinine Clearance: 109.8 mL/min (by C-G formula based on SCr of 0.89 mg/dL). Liver Function Tests: Recent Labs  Lab 05/15/22 0229  AST 50*  ALT 47*  ALKPHOS 138*  BILITOT 0.2*  PROT 5.4*  ALBUMIN <1.5*   No results for input(s): LIPASE, AMYLASE in the last 168 hours. No results for input(s): AMMONIA in the last 168 hours. Coagulation Profile: No results for input(s): INR, PROTIME in the last 168 hours. Cardiac Enzymes: No results for input(s): CKTOTAL, CKMB, CKMBINDEX, TROPONINI in the last 168 hours. BNP (last 3 results) No results for input(s): PROBNP in the last 8760 hours. HbA1C: No results for input(s): HGBA1C in the last 72 hours. CBG: Recent Labs  Lab  05/20/22 1650 05/20/22 2123 05/21/22 0801 05/21/22 1209 05/21/22 1620  GLUCAP 112* 120* 94 125* 102*   Lipid Profile: No results for input(s): CHOL, HDL, LDLCALC, TRIG, CHOLHDL, LDLDIRECT in the last 72 hours. Thyroid Function Tests: No results for input(s): TSH, T4TOTAL, FREET4, T3FREE, THYROIDAB in the last 72 hours. Anemia Panel: No results for input(s): VITAMINB12, FOLATE, FERRITIN, TIBC, IRON, RETICCTPCT in the last 72 hours. Urine analysis:    Component Value Date/Time   COLORURINE YELLOW 09/16/2020 1034   APPEARANCEUR HAZY (A) 09/16/2020 1034   LABSPEC 1.018 09/16/2020 1034   PHURINE 5.0 09/16/2020 1034   GLUCOSEU 50 (A) 09/16/2020 1034   HGBUR NEGATIVE 09/16/2020 1034   BILIRUBINUR NEGATIVE 09/16/2020 1034   KETONESUR 5 (A) 09/16/2020 1034   PROTEINUR 100 (A) 09/16/2020 1034   NITRITE NEGATIVE 09/16/2020 1034   LEUKOCYTESUR NEGATIVE 09/16/2020 1034   Family Communication: None at bedside  Disposition: Status is: Inpatient Remains inpatient  appropriate because: Ongoing VT Planned Discharge Destination:  [facility]  Patrecia Pour, MD 05/21/2022 7:19 PM Page by [email or web address]

## 2022-05-21 NOTE — Progress Notes (Signed)
PT Cancellation Note  Patient Details Name: Gregory [name] MRN: [id number] DOB: 23-Oct-1961   Cancelled Treatment:    Reason Eval/Treat Not Completed: Other (comment);Fatigue/lethargy limiting ability to participate.  Visit canceled by pt who reports being too fatigued for visit.  He is willing to consider visit tomorrow before dc to [facility].  Follow up as needed.   Ramond Dial 05/21/2022, 3:16 PM  Mee Hives, PT PhD Acute Rehab Dept. Number: [place] and [place]

## 2022-05-21 NOTE — Progress Notes (Addendum)
 Progress Note  Patient Name: Gregory [name] Date of Encounter: 05/21/2022  [facility] Cardiologist: [name] Croitoru, MD   Subjective   No CP, palpitations, SOB, slowly feeling better, hopes his insurance will cover CIR  Inpatient Medications    Scheduled Meds:  amiodarone  400 mg Oral BID   apixaban  5 mg Oral BID   atorvastatin  80 mg Oral QPM   dapagliflozin propanediol  10 mg Oral Daily   docusate sodium  100 mg Oral BID   ezetimibe  10 mg Oral Daily   feeding supplement  237 mL Oral BID BM   furosemide  40 mg Oral BID   insulin aspart  0-9 Units Subcutaneous TID WC   levETIRAcetam  500 mg Oral BID   mexiletine  150 mg Oral Q8H   pantoprazole  40 mg Oral BID   polyethylene glycol  17 g Oral BID   sodium chloride flush  3 mL Intravenous Q12H   sodium chloride flush  5 mL Intracatheter Q8H   Continuous Infusions:  sodium chloride 250 mL (05/19/22 2041)   ampicillin-sulbactam (UNASYN) IV 3 g (05/21/22 0505)   PRN Meds: sodium chloride, acetaminophen **OR** acetaminophen, bisacodyl, diphenhydrAMINE-zinc acetate, oxyCODONE-acetaminophen, sodium chloride flush, sodium phosphate   Vital Signs    Vitals:   05/20/22 1447 05/20/22 2042 05/20/22 2304 05/21/22 0335  BP: 103/69 123/86 (!) 147/99 (!) 140/99  Pulse: 74 69 87 72  Resp: 16 17 18 18  Temp: (!) 97.5 F (36.4 C) 97.7 F (36.5 C) 98 F (36.7 C) 98 F (36.7 C)  TempSrc: Oral Oral Oral Oral  SpO2: 94% 90% 93% 98%  Weight:      Height:        Intake/Output Summary (Last 24 hours) at 05/21/2022 0912 Last data filed at 05/21/2022 0509 Gross per 24 hour  Intake 410 ml  Output 1480 ml  Net -1070 ml      05/08/2022    4:26 PM 05/08/2022    8:41 AM  Last 3 Weights  Weight (lbs) 225 lb 1.4 oz 211 lb  Weight (kg) 102.1 kg 95.709 kg      Telemetry    SR, 70's, he has had less and shorter VT, rates about 100, no therapies- Personally Reviewed  ECG    No new EKGs - Personally  Reviewed  Physical Exam   Essentially unchanged exam GEN: No acute distress.   Neck: No JVD Cardiac: RRR, no murmurs, rubs, or gallops.  Respiratory: CTA b/l. GI: non-tender to light palpation, drain remains MS: No edema; No deformity. Neuro:  Nonfocal  Psych: Normal affect   Labs    High Sensitivity Troponin:   Recent Labs  Lab 05/08/22 0918 05/08/22 1101  TROPONINIHS 32* 30*     Chemistry Recent Labs  Lab 05/15/22 0229 05/15/22 0730 05/19/22 0554 05/20/22 0415 05/21/22 0555  NA 133*   < > 135 136 135  K 4.2   < > 3.9 3.7 4.4  CL 102   < > 104 104 101  CO2 24   < > 28 28 25  GLUCOSE 135*   < > 91 94 102*  BUN 6*   < > 7* 8 12  CREATININE 0.65   < > 0.69 0.75 0.89  CALCIUM 7.5*   < > 7.7* 7.7* 7.8*  MG  --    < > 2.1 2.1 2.2  PROT 5.4*  --   --   --   --   ALBUMIN <  1.5*  --   --   --   --   AST 50*  --   --   --   --   ALT 47*  --   --   --   --   ALKPHOS 138*  --   --   --   --   BILITOT 0.2*  --   --   --   --   GFRNONAA >60   < > >60 >60 >60  ANIONGAP 7   < > 3* 4* 9   < > = values in this interval not displayed.    Lipids No results for input(s): CHOL, TRIG, HDL, LABVLDL, LDLCALC, CHOLHDL in the last 168 hours.  Hematology Recent Labs  Lab 05/18/22 0317 05/19/22 0554 05/21/22 0555  WBC 12.0* 11.4* 10.2  RBC 3.86* 3.63* 4.29  HGB 9.5* 9.1* 10.8*  HCT 31.9* 30.2* 36.4*  MCV 82.6 83.2 84.8  MCH 24.6* 25.1* 25.2*  MCHC 29.8* 30.1 29.7*  RDW 22.9* 22.8* 23.5*  PLT 287 286 255   Thyroid  No results for input(s): TSH, FREET4 in the last 168 hours.   BNPNo results for input(s): BNP, PROBNP in the last 168 hours.  DDimer No results for input(s): DDIMER in the last 168 hours.   Radiology    No results found.  Cardiac Studies   04/17/22: [facility] CONCLUSIONS: Widely patent left main Mid segment of LAD contains 40 to 50% narrowing.  The large first diagonal contains a patent stent in the mid body.  Proximal to the stent is a 50% stenosis. Circumflex  is totally occluded beyond the origin of the large first obtuse marginal.  Left-to-right collaterals are noted.  The proximal circumflex before the origin of the first obtuse marginal contains 50 to 70% stenosis.  There is progression of this lesion compared to prior. Right coronary is totally occluded proximally.  It reconstitutes in the distal segment.  PDA is totally occluded.  PDA and left ventricular branches fill via left to right collaterals. Recommendations: Per managing team. Anatomy is similar to prior with the following exceptions: 50% to 70% stenosis proximal to the large first obtuse marginal.  40 to 50% stenosis proximal to the stent segment in the first diagonal.     Echocardiogram 04/07/2022: Impressions:  1. Difficult acoustic windows Endocardium not well seen Overall there  does not appear to be a signfiicant change from echo in 2021.   2. LV function is depressed with hypokinesis of the lateral wall,  mid/distal inferior wall and apex; akinesis with aneurysmal dilitation of  inferior base. . Left ventricular ejection fraction, by estimation, is 35  to 40%. The left ventricle has  moderately decreased function. The left ventricular internal cavity size  was moderately dilated. There is mild left ventricular hypertrophy. Left  ventricular diastolic parameters are indeterminate.   3. Right ventricular systolic function is normal. The right ventricular  size is normal. There is mildly elevated pulmonary artery systolic  pressure.   4. Left atrial size was severely dilated.   5. Right atrial size was mildly dilated.   6. The mitral valve is normal in structure. Mild mitral valve  regurgitation.   7. The aortic valve is tricuspid. Aortic valve regurgitation is not  visualized. Aortic valve sclerosis/calcification is present, without any  evidence of aortic stenosis.   8. There is borderline dilatation of the aortic root, measuring 39 mm.  There is borderline dilatation of the  ascending aorta, measuring 39 mm.  Patient Profile     61 y.o. male male with  history of CAD (known chronic occl of RCA with prior PCIs to LAD, diag, and LCx), VT, ICM,  OSA w/CPAP, seizures, chronic CHF, HLD, HTN, obesity, AFib, h/o VT, PVCs.   S/p R hemicolectomy 2/2 mass >> ca   Device information [employer] Dual Chamber ICD implanted 2005, 2010 and gen change for 2018    2017 to have slow VT prompting reduction of his VT zone rate to 150bpm.   Oct 2021, VT with appropriate therapies   He had stopped drinking (heavily at least).  He noted previously had been on amiodarone and mexiletine , the mexiletine stopped 2/2 GI symptoms, and the amiodarone stopped 2/2 abn ALk phos   Oct 2021 > amio restarted for VT   Comes with VT storm , though suspect at least currently provoked by his acute abdominal issue  Assessment & Plan    VT storm Long hx of the same, though likely provoked this time by acute abdominal process (As well as ischemic heart disease, question of sarcoid raised 2/2 noted degree of lacrimal enlargement) ATPs are working  Had more VT 6/3 off amio gtt (on PO) and was resumed, mexiletine added yesterday  Less VT, short and again slower rate. Mobilizing with seems plans for CIR Continue amiodarone and mexiletine unchanged for now  2. CAD Stable by cath earlier this month No CP Neg HS Trops  3. Chronic CHF Has some edema, no SOB Fluid neg yesterday -1255  4. Paroxysmal Afib SR here Back on Eliquis  5. DVT eliquis  6. Abdominal abscess 7. Sepsis 8. 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 10. Colon cancer    For questions or updates, please contact [facility] Please consult [email or web address] for contact info under        Signed, Renee Lynn Ursuy, PA-C  05/21/2022, 9:12 AM    VT better continue amio and switch to Po and mex  CHF will increase diuretics  from po to IV Continued Apixaban  for TE risk reduction  

## 2022-05-21 NOTE — Progress Notes (Signed)
Inpatient Rehabilitation Admissions Coordinator   I have insurance approval for Cir admit, but no bed available today. I am hopeful for bed in next 24 to 48 hrs.  Danne Baxter, RN, MSN Rehab Admissions Coordinator [phone] 05/21/2022 11:50 AM

## 2022-05-22 ENCOUNTER — Other Ambulatory Visit: Payer: Self-pay

## 2022-05-22 ENCOUNTER — Inpatient Hospital Stay (HOSPITAL_COMMUNITY)
Admission: RE | Admit: 2022-05-22 | Discharge: 2022-06-06 | DRG: 945 | Disposition: A | Discharge status: Home / Self Care | Payer: Worker's Compensation | Source: Intra-hospital | Attending: Physical Medicine & Rehabilitation | Admitting: Physical Medicine & Rehabilitation

## 2022-05-22 ENCOUNTER — Encounter (HOSPITAL_COMMUNITY): Payer: Self-pay | Admitting: Physical Medicine & Rehabilitation

## 2022-05-22 DIAGNOSIS — F419 Anxiety disorder, unspecified: Secondary | ICD-10-CM | POA: Diagnosis present

## 2022-05-22 DIAGNOSIS — M25511 Pain in right shoulder: Secondary | ICD-10-CM | POA: Diagnosis not present

## 2022-05-22 DIAGNOSIS — I5042 Chronic combined systolic (congestive) and diastolic (congestive) heart failure: Secondary | ICD-10-CM | POA: Diagnosis present

## 2022-05-22 DIAGNOSIS — Z955 Presence of coronary angioplasty implant and graft: Secondary | ICD-10-CM

## 2022-05-22 DIAGNOSIS — I472 Ventricular tachycardia, unspecified: Secondary | ICD-10-CM | POA: Diagnosis present

## 2022-05-22 DIAGNOSIS — I255 Ischemic cardiomyopathy: Secondary | ICD-10-CM | POA: Diagnosis present

## 2022-05-22 DIAGNOSIS — E785 Hyperlipidemia, unspecified: Secondary | ICD-10-CM | POA: Diagnosis present

## 2022-05-22 DIAGNOSIS — I25810 Atherosclerosis of coronary artery bypass graft(s) without angina pectoris: Secondary | ICD-10-CM | POA: Diagnosis not present

## 2022-05-22 DIAGNOSIS — N529 Male erectile dysfunction, unspecified: Secondary | ICD-10-CM | POA: Diagnosis present

## 2022-05-22 DIAGNOSIS — T8143XA Infection following a procedure, organ and space surgical site, initial encounter: Secondary | ICD-10-CM | POA: Diagnosis not present

## 2022-05-22 DIAGNOSIS — E119 Type 2 diabetes mellitus without complications: Secondary | ICD-10-CM | POA: Diagnosis present

## 2022-05-22 DIAGNOSIS — Z8249 Family history of ischemic heart disease and other diseases of the circulatory system: Secondary | ICD-10-CM

## 2022-05-22 DIAGNOSIS — Z95810 Presence of automatic (implantable) cardiac defibrillator: Secondary | ICD-10-CM

## 2022-05-22 DIAGNOSIS — G40909 Epilepsy, unspecified, not intractable, without status epilepticus: Secondary | ICD-10-CM | POA: Diagnosis present

## 2022-05-22 DIAGNOSIS — Z8679 Personal history of other diseases of the circulatory system: Secondary | ICD-10-CM | POA: Diagnosis not present

## 2022-05-22 DIAGNOSIS — B356 Tinea cruris: Secondary | ICD-10-CM | POA: Diagnosis not present

## 2022-05-22 DIAGNOSIS — M79601 Pain in right arm: Secondary | ICD-10-CM | POA: Diagnosis not present

## 2022-05-22 DIAGNOSIS — S31811A Laceration without foreign body of right buttock, initial encounter: Secondary | ICD-10-CM | POA: Diagnosis present

## 2022-05-22 DIAGNOSIS — E7800 Pure hypercholesterolemia, unspecified: Secondary | ICD-10-CM | POA: Diagnosis present

## 2022-05-22 DIAGNOSIS — I2510 Atherosclerotic heart disease of native coronary artery without angina pectoris: Secondary | ICD-10-CM | POA: Diagnosis present

## 2022-05-22 DIAGNOSIS — C182 Malignant neoplasm of ascending colon: Secondary | ICD-10-CM | POA: Diagnosis not present

## 2022-05-22 DIAGNOSIS — I82622 Acute embolism and thrombosis of deep veins of left upper extremity: Secondary | ICD-10-CM | POA: Diagnosis not present

## 2022-05-22 DIAGNOSIS — G4733 Obstructive sleep apnea (adult) (pediatric): Secondary | ICD-10-CM | POA: Diagnosis present

## 2022-05-22 DIAGNOSIS — E039 Hypothyroidism, unspecified: Secondary | ICD-10-CM | POA: Diagnosis present

## 2022-05-22 DIAGNOSIS — I110 Hypertensive heart disease with heart failure: Secondary | ICD-10-CM | POA: Diagnosis present

## 2022-05-22 DIAGNOSIS — I480 Paroxysmal atrial fibrillation: Secondary | ICD-10-CM | POA: Diagnosis present

## 2022-05-22 DIAGNOSIS — K651 Peritoneal abscess: Secondary | ICD-10-CM | POA: Diagnosis present

## 2022-05-22 DIAGNOSIS — Z7902 Long term (current) use of antithrombotics/antiplatelets: Secondary | ICD-10-CM

## 2022-05-22 DIAGNOSIS — E8809 Other disorders of plasma-protein metabolism, not elsewhere classified: Secondary | ICD-10-CM | POA: Diagnosis present

## 2022-05-22 DIAGNOSIS — Z79899 Other long term (current) drug therapy: Secondary | ICD-10-CM

## 2022-05-22 DIAGNOSIS — R5381 Other malaise: Secondary | ICD-10-CM | POA: Diagnosis present

## 2022-05-22 DIAGNOSIS — Z9049 Acquired absence of other specified parts of digestive tract: Secondary | ICD-10-CM

## 2022-05-22 DIAGNOSIS — Z85038 Personal history of other malignant neoplasm of large intestine: Secondary | ICD-10-CM

## 2022-05-22 DIAGNOSIS — D649 Anemia, unspecified: Secondary | ICD-10-CM

## 2022-05-22 DIAGNOSIS — Z87891 Personal history of nicotine dependence: Secondary | ICD-10-CM

## 2022-05-22 DIAGNOSIS — I10 Essential (primary) hypertension: Secondary | ICD-10-CM | POA: Diagnosis not present

## 2022-05-22 DIAGNOSIS — M5412 Radiculopathy, cervical region: Secondary | ICD-10-CM | POA: Diagnosis not present

## 2022-05-22 DIAGNOSIS — R609 Edema, unspecified: Secondary | ICD-10-CM | POA: Diagnosis not present

## 2022-05-22 DIAGNOSIS — Z7901 Long term (current) use of anticoagulants: Secondary | ICD-10-CM | POA: Diagnosis not present

## 2022-05-22 DIAGNOSIS — Z86718 Personal history of other venous thrombosis and embolism: Secondary | ICD-10-CM

## 2022-05-22 DIAGNOSIS — C779 Secondary and unspecified malignant neoplasm of lymph node, unspecified: Secondary | ICD-10-CM | POA: Diagnosis present

## 2022-05-22 LAB — CBC
HCT: 35.5 % — ABNORMAL LOW (ref 39.0–52.0)
Hemoglobin: 10.7 g/dL — ABNORMAL LOW (ref 13.0–17.0)
MCH: 25.5 pg — ABNORMAL LOW (ref 26.0–34.0)
MCHC: 30.1 g/dL (ref 30.0–36.0)
MCV: 84.7 fL (ref 80.0–100.0)
Platelets: 329 10*3/uL (ref 150–400)
RBC: 4.19 MIL/uL — ABNORMAL LOW (ref 4.22–5.81)
RDW: 23.8 % — ABNORMAL HIGH (ref 11.5–15.5)
WBC: 11.1 10*3/uL — ABNORMAL HIGH (ref 4.0–10.5)
nRBC: 0 % (ref 0.0–0.2)

## 2022-05-22 LAB — BASIC METABOLIC PANEL
Anion gap: 7 (ref 5–15)
BUN: 11 mg/dL (ref 8–23)
CO2: 28 mmol/L (ref 22–32)
Calcium: 7.6 mg/dL — ABNORMAL LOW (ref 8.9–10.3)
Chloride: 101 mmol/L (ref 98–111)
Creatinine, Ser: 0.75 mg/dL (ref 0.61–1.24)
GFR, Estimated: >60 mL/min (ref >60)
Glucose, Bld: 92 mg/dL (ref 70–99)
Potassium: 3.9 mmol/L (ref 3.5–5.1)
Sodium: 136 mmol/L (ref 135–145)

## 2022-05-22 LAB — GLUCOSE, CAPILLARY
Glucose-Capillary: 76 mg/dL (ref 70–99)
Glucose-Capillary: 83 mg/dL (ref 70–99)
Glucose-Capillary: 84 mg/dL (ref 70–99)
Glucose-Capillary: 99 mg/dL (ref 70–99)

## 2022-05-22 LAB — MAGNESIUM: Magnesium: 2.1 mg/dL (ref 1.7–2.4)

## 2022-05-22 MED ORDER — GUAIFENESIN-DM 100-10 MG/5ML PO SYRP: 5.0000 mL | ORAL_SOLUTION | Freq: Four times a day (QID) | ORAL | Status: DC | PRN

## 2022-05-22 MED ORDER — OXYCODONE-ACETAMINOPHEN 5-325 MG PO TABS
1.0000 | ORAL_TABLET | ORAL | Status: DC | PRN
  Administered 2022-05-22 – 2022-05-24 (×8): 2 via ORAL
  Administered 2022-05-24: 1 via ORAL
  Administered 2022-05-25 – 2022-05-27 (×10): 2 via ORAL
  Administered 2022-05-28 (×2): 1 via ORAL
  Administered 2022-05-28 (×3): 2 via ORAL
  Administered 2022-05-29: 1 via ORAL
  Administered 2022-05-29 – 2022-06-04 (×19): 2 via ORAL
  Administered 2022-06-04: 1 via ORAL
  Administered 2022-06-04 – 2022-06-06 (×5): 2 via ORAL
  Filled 2022-05-22 (×8): qty 2
  Filled 2022-05-22: qty 1
  Filled 2022-05-22 (×23): qty 2
  Filled 2022-05-22: qty 1
  Filled 2022-05-22 (×25): qty 2

## 2022-05-22 MED ORDER — FUROSEMIDE 40 MG PO TABS: 40.0000 mg | ORAL_TABLET | Freq: Every day | ORAL | Status: DC

## 2022-05-22 MED ORDER — ATORVASTATIN CALCIUM 80 MG PO TABS
80.0000 mg | ORAL_TABLET | Freq: Every evening | ORAL | Status: DC
  Administered 2022-05-22 – 2022-06-05 (×15): 80 mg via ORAL
  Filled 2022-05-22 (×16): qty 1

## 2022-05-22 MED ORDER — APIXABAN 5 MG PO TABS
5.0000 mg | ORAL_TABLET | Freq: Two times a day (BID) | ORAL | Status: DC
  Administered 2022-05-22 – 2022-06-06 (×30): 5 mg via ORAL
  Filled 2022-05-22 (×30): qty 1

## 2022-05-22 MED ORDER — PANTOPRAZOLE SODIUM 40 MG PO TBEC
40.0000 mg | DELAYED_RELEASE_TABLET | Freq: Two times a day (BID) | ORAL | Status: DC
  Administered 2022-05-22 – 2022-06-06 (×30): 40 mg via ORAL
  Filled 2022-05-22 (×30): qty 1

## 2022-05-22 MED ORDER — MEXILETINE HCL 150 MG PO CAPS: 150.0000 mg | ORAL_CAPSULE | Freq: Three times a day (TID) | ORAL | Status: DC

## 2022-05-22 MED ORDER — PROCHLORPERAZINE MALEATE 5 MG PO TABS: 5.0000 mg | ORAL_TABLET | Freq: Four times a day (QID) | ORAL | Status: DC | PRN

## 2022-05-22 MED ORDER — MAGNESIUM HYDROXIDE 400 MG/5ML PO SUSP
30.0000 mL | Freq: Every day | ORAL | Status: DC | PRN
  Filled 2022-05-22: qty 30

## 2022-05-22 MED ORDER — AMOXICILLIN-POT CLAVULANATE 875-125 MG PO TABS: 1.0000 | ORAL_TABLET | Freq: Two times a day (BID) | ORAL | Status: DC

## 2022-05-22 MED ORDER — ENSURE ENLIVE PO LIQD
237.0000 mL | Freq: Two times a day (BID) | ORAL | Status: DC
  Administered 2022-05-23 – 2022-06-06 (×24): 237 mL via ORAL

## 2022-05-22 MED ORDER — PROCHLORPERAZINE EDISYLATE 10 MG/2ML IJ SOLN: 5.0000 mg | Freq: Four times a day (QID) | INTRAMUSCULAR | Status: DC | PRN

## 2022-05-22 MED ORDER — DIPHENHYDRAMINE HCL 12.5 MG/5ML PO ELIX: 12.5000 mg | ORAL_SOLUTION | Freq: Four times a day (QID) | ORAL | Status: DC | PRN

## 2022-05-22 MED ORDER — LEVETIRACETAM 500 MG PO TABS
500.0000 mg | ORAL_TABLET | Freq: Two times a day (BID) | ORAL | Status: DC
  Administered 2022-05-22 – 2022-06-06 (×30): 500 mg via ORAL
  Filled 2022-05-22 (×30): qty 1

## 2022-05-22 MED ORDER — DAPAGLIFLOZIN PROPANEDIOL 10 MG PO TABS
10.0000 mg | ORAL_TABLET | Freq: Every day | ORAL | Status: DC
  Administered 2022-05-23 – 2022-06-06 (×15): 10 mg via ORAL
  Filled 2022-05-22 (×15): qty 1

## 2022-05-22 MED ORDER — BISACODYL 10 MG RE SUPP: 10.0000 mg | Freq: Every day | RECTAL | Status: DC | PRN

## 2022-05-22 MED ORDER — EZETIMIBE 10 MG PO TABS
10.0000 mg | ORAL_TABLET | Freq: Every day | ORAL | Status: DC
  Administered 2022-05-23 – 2022-06-06 (×15): 10 mg via ORAL
  Filled 2022-05-22 (×15): qty 1

## 2022-05-22 MED ORDER — PROCHLORPERAZINE 25 MG RE SUPP: 12.5000 mg | Freq: Four times a day (QID) | RECTAL | Status: DC | PRN

## 2022-05-22 MED ORDER — ACETAMINOPHEN 325 MG PO TABS
325.0000 mg | ORAL_TABLET | ORAL | Status: DC | PRN
  Administered 2022-05-23 – 2022-06-04 (×5): 650 mg via ORAL
  Filled 2022-05-22 (×5): qty 2

## 2022-05-22 MED ORDER — AMOXICILLIN-POT CLAVULANATE 875-125 MG PO TABS
1.0000 | ORAL_TABLET | Freq: Two times a day (BID) | ORAL | Status: DC
  Administered 2022-05-22: 1 via ORAL
  Filled 2022-05-22: qty 1

## 2022-05-22 MED ORDER — POLYETHYLENE GLYCOL 3350 17 G PO PACK
17.0000 g | PACK | Freq: Two times a day (BID) | ORAL | Status: DC
  Administered 2022-05-24 – 2022-06-06 (×8): 17 g via ORAL
  Filled 2022-05-22 (×26): qty 1

## 2022-05-22 MED ORDER — DOCUSATE SODIUM 100 MG PO CAPS: 100.0000 mg | ORAL_CAPSULE | Freq: Two times a day (BID) | ORAL | Status: DC

## 2022-05-22 MED ORDER — CYCLOBENZAPRINE HCL 7.5 MG PO TABS: 7.5000 mg | ORAL_TABLET | Freq: Three times a day (TID) | ORAL | Status: DC | PRN

## 2022-05-22 MED ORDER — TRAZODONE HCL 50 MG PO TABS
25.0000 mg | ORAL_TABLET | Freq: Every evening | ORAL | Status: DC | PRN
  Administered 2022-05-23 – 2022-06-02 (×11): 50 mg via ORAL
  Administered 2022-06-03: 25 mg via ORAL
  Administered 2022-06-04 – 2022-06-05 (×2): 50 mg via ORAL
  Filled 2022-05-22 (×14): qty 1

## 2022-05-22 MED ORDER — POLYETHYLENE GLYCOL 3350 17 G PO PACK: 17.0000 g | PACK | Freq: Two times a day (BID) | ORAL | Status: DC

## 2022-05-22 MED ORDER — AMIODARONE HCL 400 MG PO TABS: 400.0000 mg | ORAL_TABLET | Freq: Two times a day (BID) | ORAL | Status: DC

## 2022-05-22 MED ORDER — SODIUM CHLORIDE 0.9 % IV SOLN: 250.0000 mL | INTRAVENOUS | Status: DC | PRN

## 2022-05-22 MED ORDER — ALUM & MAG HYDROXIDE-SIMETH 200-200-20 MG/5ML PO SUSP: 30.0000 mL | ORAL | Status: DC | PRN

## 2022-05-22 MED ORDER — METHOCARBAMOL 500 MG PO TABS
500.0000 mg | ORAL_TABLET | Freq: Four times a day (QID) | ORAL | Status: DC | PRN
Start: 2022-05-22 — End: 2022-06-06
  Administered 2022-05-22 – 2022-06-05 (×25): 500 mg via ORAL
  Filled 2022-05-22 (×30): qty 1

## 2022-05-22 MED ORDER — FLEET ENEMA 7-19 GM/118ML RE ENEM
1.0000 | ENEMA | Freq: Once | RECTAL | Status: DC | PRN
Start: 2022-05-22 — End: 2022-06-06

## 2022-05-22 MED ORDER — AMOXICILLIN-POT CLAVULANATE 875-125 MG PO TABS
1.0000 | ORAL_TABLET | Freq: Two times a day (BID) | ORAL | Status: DC
  Administered 2022-05-22 – 2022-06-06 (×30): 1 via ORAL
  Filled 2022-05-22 (×31): qty 1

## 2022-05-22 MED ORDER — DIPHENHYDRAMINE-ZINC ACETATE 2-0.1 % EX CREA
TOPICAL_CREAM | Freq: Three times a day (TID) | CUTANEOUS | Status: DC | PRN
Start: 2022-05-22 — End: 2022-06-06
  Filled 2022-05-22: qty 28

## 2022-05-22 MED ORDER — MEXILETINE HCL 150 MG PO CAPS
150.0000 mg | ORAL_CAPSULE | Freq: Three times a day (TID) | ORAL | Status: DC
  Administered 2022-05-22 – 2022-06-06 (×44): 150 mg via ORAL
  Filled 2022-05-22 (×45): qty 1

## 2022-05-22 MED ORDER — DOCUSATE SODIUM 100 MG PO CAPS
100.0000 mg | ORAL_CAPSULE | Freq: Two times a day (BID) | ORAL | Status: DC
  Administered 2022-05-22 – 2022-06-06 (×30): 100 mg via ORAL
  Filled 2022-05-22 (×30): qty 1

## 2022-05-22 MED ORDER — AMIODARONE HCL 200 MG PO TABS
400.0000 mg | ORAL_TABLET | Freq: Two times a day (BID) | ORAL | Status: DC
  Administered 2022-05-22 – 2022-06-06 (×30): 400 mg via ORAL
  Filled 2022-05-22 (×31): qty 2

## 2022-05-22 MED ORDER — FUROSEMIDE 40 MG PO TABS
40.0000 mg | ORAL_TABLET | Freq: Two times a day (BID) | ORAL | Status: DC
  Administered 2022-05-22 – 2022-05-25 (×6): 40 mg via ORAL
  Filled 2022-05-22 (×6): qty 1

## 2022-05-22 NOTE — H&P (Signed)
Physical Medicine and Rehabilitation Admission H&P     CC: Debility secondary to sepsis, intra-abdominal abscess and recent colon surgery   HPI: Gregory [name] is a 61 year old male with recent diagnosis of colon mass status post right colon resection on Apr 18, 2022.  His history is also significant for ventricular tachycardia status post AICD in 2006.  He was brought to the emergency department on 5/25 via EMS after experiencing palpitations and shock discharge from his ICD.  Electrophysiology consultation was obtained and he was placed on amiodarone infusion.  CT scan of the abdomen pelvis performed and showed a very large intra-abdominal abscess as well as pseudoaneurysm of the splenic artery.  General surgery was consulted who discussed findings with interventional radiology.  Broad-spectrum antibiotics started.  CT angiogram was performed the following morning.  He underwent percutaneous drainage of the abscess and embolization of the splenic artery pseudoaneurysm on 5/26 by Dr. Dwaine Gale.  Infectious disease consultation was obtained.  Cultures grew E. coli and strep anginosus sensitive to Unasyn.   He continued to have intermittent runs of ventricular tachycardia and electrophysiology continued monitoring.  He has a history of coronary artery disease status post PCI/stent with no anginal symptoms.  Follow-up abdominal CT revealed persistent intra-abdominal abscess and on 5/31, he underwent catheter exchange and repositioning by IR.     During his previous hospitalization, he was found to have left upper extremity DVT in the left basilic vein on 5/8.  Due to upper extremity edema a repeat duplex study was performed on 6/1 with findings of left brachial vein. thrombosis.  Heparin infusion initiated. Transitioned to Eliquis.   He was transitioned to oral amiodarone on 6/2, however he developed recurrent VT and was placed back on amiodarone intravenously.  Mexiletine was initiated.  Able to  transition back to amiodarone 400 mg p.o. twice daily on 6/6.  Infectious disease follow-up on 6/7 recommend switching to amoxicillin/clavulanate 875 mg twice daily for 3-4 additional weeks and recommends repeat CT of the abdomen around 6/12.  Percutaneous drain continues.The patient requires inpatient medicine and rehabilitation evaluations and services for ongoing dysfunction secondary to sepsis, intra-abdominal abscess and recent colon surgery.     Pt reports feels like JP drain is stopped up- having low back pain but somewhat better- thinks pulled muscle rolling in bed.  Has also been in bedside chair for 3 hours- which is most since been here.  LBM yesterday- not formed Voiding OK- using urinal     Review of Systems  Constitutional:  Negative for chills and fever.  HENT:  Negative for hearing loss and sore throat.   Eyes:  Negative for blurred vision and pain.  Respiratory:  Negative for cough and shortness of breath.   Cardiovascular:  Negative for chest pain and palpitations.  Gastrointestinal:  Negative for nausea and vomiting.  Genitourinary:  Positive for urgency. Negative for dysuria.  Skin:        Small area of superficial breakdown left sacral area  Neurological:  Negative for dizziness and headaches.  All other systems reviewed and are negative.       Past Medical History:  Diagnosis Date   AICD (automatic cardioverter/defibrillator) present 2005   CAD (coronary artery disease) 12/01/2013   Chronic combined systolic and diastolic CHF, NYHA class 1 ([place]) 12/01/2013   Erectile dysfunction 12/01/2013   HTN (hypertension) 12/01/2013   Hyperlipidemia 12/01/2013   Ischemic cardiomyopathy 12/01/2013   Presence of permanent cardiac pacemaker     Sleep  apnea           Past Surgical History:  Procedure Laterality Date   ACHILLES TENDON REPAIR        lft foot   BIOPSY   04/16/2022    Procedure: BIOPSY;  Surgeon: Yetta Flock, MD;  Location: [place] [facility];  Service:  Gastroenterology;;   CARDIAC CATHETERIZATION N/A 05/05/2016    Procedure: Left Heart Cath and Coronary Angiography;  Surgeon: Leonie Man, MD;  Location: [place] [facility];  Service: Cardiovascular;  Laterality: N/A;   CARDIAC DEFIBRILLATOR PLACEMENT       CARDIOVERSION N/A 09/13/2020    Procedure: CARDIOVERSION;  Surgeon: Werner Lean, MD;  Location: [place] ENDOSCOPY;  Service: Cardiovascular;  Laterality: N/A;   CARDIOVERSION N/A 09/19/2020    Procedure: CARDIOVERSION;  Surgeon: [name], MD;  Location: [facility] ENDOSCOPY;  Service: Cardiovascular;  Laterality: N/A;   COLON RESECTION N/A 04/18/2022    Procedure: HAND ASSISTED LAPAROSCOPIC RIGHT COLON RESECTION;  Surgeon: Felicie Morn, MD;  Location: [place];  Service: General;  Laterality: N/A;   COLONOSCOPY WITH PROPOFOL N/A 04/16/2022    Procedure: COLONOSCOPY WITH PROPOFOL;  Surgeon: Yetta Flock, MD;  Location: [place];  Service: Gastroenterology;  Laterality: N/A;   EP IMPLANTABLE DEVICE N/A 12/19/2016    Procedure: ICD Generator Changeout;  Surgeon: [name], MD;  Location: [place] [facility];  Service: Cardiovascular;  Laterality: N/A;   ESOPHAGOGASTRODUODENOSCOPY N/A 04/16/2022    Procedure: ESOPHAGOGASTRODUODENOSCOPY (EGD);  Surgeon: Yetta Flock, MD;  Location: [facility] ENDOSCOPY;  Service: Gastroenterology;  Laterality: N/A;   IR ANGIOGRAM SELECTIVE EACH ADDITIONAL VESSEL   05/09/2022   IR ANGIOGRAM VISCERAL SELECTIVE   05/09/2022   IR CATHETER TUBE CHANGE   05/14/2022   IR EMBO ART  VEN HEMORR LYMPH EXTRAV  INC GUIDE ROADMAPPING   05/09/2022   IR IMAGE GUIDED DRAINAGE PERCUT CATH  PERITONEAL RETROPERIT   05/09/2022   IR US GUIDE VASC ACCESS RIGHT   05/09/2022   LEFT HEART CATH AND CORONARY ANGIOGRAPHY N/A 04/17/2022    Procedure: LEFT HEART CATH AND CORONARY ANGIOGRAPHY;  Surgeon: Belva Crome, MD;  Location: [place] [facility];  Service: Cardiovascular;  Laterality: N/A;   POLYPECTOMY   04/16/2022     Procedure: POLYPECTOMY;  Surgeon: Yetta Flock, MD;  Location: [facility] ENDOSCOPY;  Service: Gastroenterology;;   RIGHT/LEFT HEART CATH AND CORONARY ANGIOGRAPHY N/A 09/24/2020    Procedure: RIGHT/LEFT HEART CATH AND CORONARY ANGIOGRAPHY;  Surgeon: Lorretta Harp, MD;  Location: [place] [facility];  Service: Cardiovascular;  Laterality: N/A;   SUBMUCOSAL TATTOO INJECTION   04/16/2022    Procedure: SUBMUCOSAL TATTOO INJECTION;  Surgeon: Yetta Flock, MD;  Location: [facility] ENDOSCOPY;  Service: Gastroenterology;;   WRIST TENODESIS             Family History  Problem Relation Age of Onset   Heart disease Mother     Brain cancer Father     Hypertension Daughter      Social History:  reports that he quit smoking about 8 years ago. His smoking use included cigarettes. He has a 30.00 pack-year smoking history. He has never used smokeless tobacco. He reports that he does not currently use alcohol. He reports that he does not use drugs. Allergies: No Known Allergies       Medications Prior to Admission  Medication Sig Dispense Refill   acetaminophen (TYLENOL) 500 MG tablet Take 1,000 mg by mouth every 6 (  six) hours as needed for moderate pain.       amiodarone (PACERONE) 200 MG tablet Take 2 tablets (400 mg total) by mouth daily. 60 tablet 0   apixaban (ELIQUIS) 5 MG TABS tablet TAKE 1 TABLET(5 MG) BY MOUTH TWICE DAILY (Patient taking differently: Take 5 mg by mouth 2 (two) times daily.) 180 tablet 1   atorvastatin (LIPITOR) 80 MG tablet TAKE ONE TABLET BY MOUTH EVERY EVENING (Patient taking differently: Take 80 mg by mouth every evening.) 90 tablet 3   clopidogrel (PLAVIX) 75 MG tablet TAKE 1 TABLET(75 MG) BY MOUTH DAILY (Patient taking differently: Take 75 mg by mouth daily. TAKE 1 TABLET(75 MG) BY MOUTH DAILY) 90 tablet 2   dapagliflozin propanediol (FARXIGA) 10 MG TABS tablet Take 1 tablet (10 mg total) by mouth daily. 90 tablet 3   ezetimibe (ZETIA) 10 MG tablet Take 1 tablet (10 mg total)  by mouth daily. 90 tablet 3   furosemide (LASIX) 20 MG tablet Take 2 tablets (40 mg total) by mouth 3 (three) times a week. (Patient taking differently: Take 40 mg by mouth every Monday, Wednesday, and Friday.) 80 tablet 1   levETIRAcetam (KEPPRA) 500 MG tablet TAKE 1 TABLET(500 MG) BY MOUTH TWICE DAILY (Patient taking differently: Take 500 mg by mouth 2 (two) times daily.) 60 tablet 3   metoprolol succinate (TOPROL-XL) 25 MG 24 hr tablet Take one tablet, 25 mg, in the morning and 50 mg, two tablets, in the evening. (Patient taking differently: 25 mg. Per patient taking 25 mg in the morning and 25 mg at night) 135 tablet 3   oxyCODONE (OXY IR/ROXICODONE) 5 MG immediate release tablet Take 1-2 tablets (5-10 mg total) by mouth every 8 (eight) hours as needed for breakthrough pain. 15 tablet 0   potassium chloride (KLOR-CON M) 10 MEQ tablet Take 1 tablet (10 mEq total) by mouth daily. 30 tablet 6   sacubitril-valsartan (ENTRESTO) 49-51 MG Take 1 tablet by mouth 2 (two) times daily. 180 tablet 1   pantoprazole (PROTONIX) 40 MG tablet TAKE 1 TABLET(40 MG) BY MOUTH DAILY (Patient taking differently: Take 40 mg by mouth daily. TAKE 1 TABLET(40 MG) BY MOUTH DAILY) 30 tablet 2   sildenafil (VIAGRA) 50 MG tablet Take 1 tablet (50 mg total) by mouth as needed for erectile dysfunction. 30 tablet 4          Home: Home Living Family/patient expects to be discharged to:: Private residence Living Arrangements:  (adult son lives with him) Available Help at Discharge:  (son works from home) Type of Home: House Home Access: Level entry Home Layout: Two level, Bed/bath upstairs, 1/2 bath on main level Alternate Level Stairs-Number of Steps: 14 Alternate Level Stairs-Rails: Right, Left, Can reach both Bathroom Shower/Tub: Chiropodist: Standard Bathroom Accessibility: Yes Home Equipment: Shower seat, Hand held shower head Additional Comments: pt states not needing DME, son assists with  driving and errands  Lives With: Son   Functional History: Prior Function Prior Level of Function : Independent/Modified Independent Mobility Comments: pt states independent without DME, prior to issues starting had been going to the gym daily for cardio and strength training. ADLs Comments: pt reports independence, family assists with IADLs   Functional Status:  Mobility: Bed Mobility Overal bed mobility: Needs Assistance Bed Mobility: Rolling, Sit to Sidelying Rolling: Supervision Sidelying to sit: Min assist, HOB elevated Supine to sit: Min assist Sit to supine: Min assist Sit to sidelying: Min assist General bed mobility comments: required assistance  with BLEs to get back to bed Transfers Overall transfer level: Needs assistance Equipment used: Rolling walker (2 wheels) Transfers: Sit to/from Stand, Bed to chair/wheelchair/BSC Sit to Stand: Min guard, Min assist Bed to/from chair/wheelchair/BSC transfer type:: Stand pivot Stand pivot transfers: Min guard General transfer comment: patient min assist to stand from recliner and min guard to min assist to stand from [facility] and EOB Ambulation/Gait Ambulation/Gait assistance: Min assist Gait Distance (Feet): 30 Feet Assistive device: Rolling walker (2 wheels) Gait Pattern/deviations: Step-through pattern, Decreased stride length, Trunk flexed General Gait Details: cues for posture and proximity to RW. Pt walked 30' x 2 then additional 15' all with seated rest between trials Gait velocity: decreased Gait velocity interpretation: 1.31 - 2.62 ft/sec, indicative of limited community ambulator Pre-gait activities: sidesteps x3 to his R side using RW, shuffled   ADL: ADL Overall ADL's : Needs assistance/impaired Eating/Feeding: Minimal assistance Eating/Feeding Details (indicate cue type and reason): assistance to open containers Grooming: Wash/dry hands, Wash/dry face, Minimal assistance, Standing Grooming Details (indicate cue type  and reason): stood at sink Upper Body Bathing: Set up, Sitting Lower Body Bathing: Minimal assistance, Sit to/from stand Upper Body Dressing : Set up, Sitting Lower Body Dressing: Minimal assistance, Sit to/from stand Lower Body Dressing Details (indicate cue type and reason): able to bring feet to self for sock mgmt with increased effort. reports typically propping foot up on bedframe at home and bedrail here. discussed easier clothing to manage and techniques to minimize pain Toilet Transfer: Minimal assistance, BSC/3in1 Toilet Transfer Details (indicate cue type and reason): transfer from EOB to [facility] with RW and min assist Toileting- Clothing Manipulation and Hygiene: Minimal assistance, Sit to/from stand Toileting - Clothing Manipulation Details (indicate cue type and reason): required min assist to complete General ADL Comments: mi nassist to stand from lower surfaces.   Cognition: Cognition Overall Cognitive Status: Within Functional Limits for tasks assessed Orientation Level: Oriented X4 Cognition Arousal/Alertness: Awake/alert Behavior During Therapy: WFL for tasks assessed/performed Overall Cognitive Status: Within Functional Limits for tasks assessed General Comments: anxious over pain and heart palpatations   Physical Exam: Blood pressure (!) 147/103, pulse 76, temperature 97.8 F (36.6 C), temperature source Oral, resp. rate 19, height '6\' 6"'$  (1.981 m), weight 102.1 kg, SpO2 95 %. Physical Exam Vitals and nursing note reviewed. Exam conducted with a chaperone present.  Constitutional:      General: He is not in acute distress.    Comments: 6 ft 6 inches male sitting up in bedside chair- took 2 people to get him up to get into bed at end of interview; alert, appropriate, NAD  HENT:     Head: Normocephalic and atraumatic.     Comments: L eye chronic fatty deposit/mass on lateral aspect of L eye- not interfering with vision EOMI B/L      Right Ear: External ear normal.      Left Ear: External ear normal.     Nose: Nose normal. No congestion.     Mouth/Throat:     Mouth: Mucous membranes are moist.     Pharynx: Oropharynx is clear. No oropharyngeal exudate.  Eyes:     General:        Right eye: No discharge.        Left eye: No discharge.     Extraocular Movements: Extraocular movements intact.     Comments: Left sclera pterygium  Cardiovascular:     Rate and Rhythm: Normal rate and regular rhythm.     Heart  sounds: Normal heart sounds. No murmur heard.    No gallop.  Pulmonary:     Effort: Pulmonary effort is normal. No respiratory distress.     Breath sounds: Normal breath sounds. No wheezing, rhonchi or rales.  Abdominal:     Comments: Hypoactive BS; ?distension vs protuberance NT; bandage on R side of abd with JP drain draining purulent drainage- 2-5cc in bulb  Musculoskeletal:     Cervical back: Neck supple. No tenderness.     Right lower leg: Edema present.     Left lower leg: Edema present.     Comments: L arm forearm to mid upper arm nonpitting edema 2-3+ B/L LE's 3+ pitting edema to knees B/L- no TEDs and hanging down 5-/5 throughout except LLE 4+/5 (pt felt it feels heavier)  Skin:    General: Skin is warm and dry.     Comments: Small area of superficial breakdown left sacral area Skin tear of R buttock- nickel sized R hand IV site- appears was infiltrated- bump in skin R forearm reddened- appears cellulitic- from previous IV JP drain as already discussed B/L ear piercings  Neurological:     General: No focal deficit present.     Mental Status: He is oriented to person, place, and time.     Comments: Intact to light touch in all 4 extremities  Psychiatric:        Mood and Affect: Mood normal.        Behavior: Behavior normal.        Lab Results Last 48 Hours        Results for orders placed or performed during the hospital encounter of 05/08/22 (from the past 48 hour(s))  Glucose, capillary     Status: None    Collection Time:  05/20/22 11:47 AM  Result Value Ref Range    Glucose-Capillary 80 70 - 99 mg/dL      Comment: Glucose reference range applies only to samples taken after fasting for at least 8 hours.  Glucose, capillary     Status: Abnormal    Collection Time: 05/20/22  4:50 PM  Result Value Ref Range    Glucose-Capillary 112 (H) 70 - 99 mg/dL      Comment: Glucose reference range applies only to samples taken after fasting for at least 8 hours.  Glucose, capillary     Status: Abnormal    Collection Time: 05/20/22  9:23 PM  Result Value Ref Range    Glucose-Capillary 120 (H) 70 - 99 mg/dL      Comment: Glucose reference range applies only to samples taken after fasting for at least 8 hours.  Magnesium     Status: None    Collection Time: 05/21/22  5:55 AM  Result Value Ref Range    Magnesium 2.2 1.7 - 2.4 mg/dL      Comment: Performed at [facility] [address], [place], [place] [address]  Basic metabolic panel     Status: Abnormal    Collection Time: 05/21/22  5:55 AM  Result Value Ref Range    Sodium 135 135 - 145 mmol/L    Potassium 4.4 3.5 - 5.1 mmol/L    Chloride 101 98 - 111 mmol/L    CO2 25 22 - 32 mmol/L    Glucose, Bld 102 (H) 70 - 99 mg/dL      Comment: Glucose reference range applies only to samples taken after fasting for at least 8 hours.    BUN 12 8 -  23 mg/dL    Creatinine, Ser 0.89 0.61 - 1.24 mg/dL    Calcium 7.8 (L) 8.9 - 10.3 mg/dL    GFR, Estimated >60 >60 mL/min      Comment: (NOTE) Calculated using the CKD-EPI Creatinine Equation (2021)      Anion gap 9 5 - 15      Comment: Performed at [facility] [address], [place], [place] [address]  CBC     Status: Abnormal    Collection Time: 05/21/22  5:55 AM  Result Value Ref Range    WBC 10.2 4.0 - 10.5 K/uL    RBC 4.29 4.22 - 5.81 MIL/uL    Hemoglobin 10.8 (L) 13.0 - 17.0 g/dL    HCT 36.4 (L) 39.0 - 52.0 %    MCV 84.8 80.0 - 100.0 fL    MCH 25.2 (L) 26.0 - 34.0 pg    MCHC 29.7 (L) 30.0 - 36.0 g/dL     RDW 23.5 (H) 11.5 - 15.5 %    Platelets 255 150 - 400 K/uL    nRBC 0.0 0.0 - 0.2 %      Comment: Performed at [facility], [place] [address], [place], [place] [address]  Glucose, capillary     Status: None    Collection Time: 05/21/22  8:01 AM  Result Value Ref Range    Glucose-Capillary 94 70 - 99 mg/dL      Comment: Glucose reference range applies only to samples taken after fasting for at least 8 hours.  Glucose, capillary     Status: Abnormal    Collection Time: 05/21/22 12:09 PM  Result Value Ref Range    Glucose-Capillary 125 (H) 70 - 99 mg/dL      Comment: Glucose reference range applies only to samples taken after fasting for at least 8 hours.  Glucose, capillary     Status: Abnormal    Collection Time: 05/21/22  4:20 PM  Result Value Ref Range    Glucose-Capillary 102 (H) 70 - 99 mg/dL      Comment: Glucose reference range applies only to samples taken after fasting for at least 8 hours.  Glucose, capillary     Status: None    Collection Time: 05/21/22  9:51 PM  Result Value Ref Range    Glucose-Capillary 96 70 - 99 mg/dL      Comment: Glucose reference range applies only to samples taken after fasting for at least 8 hours.  Magnesium     Status: None    Collection Time: 05/22/22  2:21 AM  Result Value Ref Range    Magnesium 2.1 1.7 - 2.4 mg/dL      Comment: Performed at [facility] [address], [place], [place] [address]  Glucose, capillary     Status: None    Collection Time: 05/22/22  7:32 AM  Result Value Ref Range    Glucose-Capillary 76 70 - 99 mg/dL      Comment: Glucose reference range applies only to samples taken after fasting for at least 8 hours.      Imaging Results (Last 48 hours)  No results found.         Blood pressure (!) 147/103, pulse 76, temperature 97.8 F (36.6 C), temperature source Oral, resp. rate 19, height '6\' 6"'$  (1.981 m), weight 102.1 kg, SpO2 95 %.   Medical Problem List and Plan: 1. Functional deficits secondary  to sepsis, intra-abdominal abscess status post percutaneous drain and recent colon  surgery             -patient may  shower if covers JP drain site             -ELOS/Goals: 10-12 days mod I to intermittent supervision 2.  Antithrombotics: -DVT/anticoagulation:  Pharmaceutical: Other (comment) Eliquis 5 mg twice daily             -antiplatelet therapy: None 3. Pain Management: Tylenol, Percocet as needed 4. Mood: LCSW to evaluate and provide emotional support             -antipsychotic agents: n/a 5. Neuropsych: This patient is capable of making decisions on his own behalf. 6. Skin/Wound Care: Routine skin care checks             --routine drain care 7. Fluids/Electrolytes/Nutrition: Routine ins and outs and follow-up chemistries 8: Intraabdominal abscess s/p perc drain placement- pt knows might go home with drain             --plan Augmentin for 3-4 weeks per ID             --plan follow-up CT 6/12             --continue monitoring drain output; follow-up IR drain clinic 9: Colon cancer s/p right hemicolectomy 5/5 with mets to lymph nodes             --follow-up with Dr. Burr Medico; PET scan pending- outpatient? 10: CAD/prior PCI to LAD             --cath on 5/4-stable continue medical management             --continue Plavix and aspirin 11: Recurrent VT/prior ACID: continue mexiletine and amiodorone             --keep K+ > 4.0; Mg > 2.0             --follow-up with Dr. Caryl Comes 12: Chronic sys and dia HF: continue [place]; Lasix (daily dosing)             --daily weight             --his home Delene Loll has not been restarted (call into cardiology)             --follow-up Dr. Sallyanne Kuster 13: Paroxysmal AF: continue Eliquis 14: Left brachial DVT: continue Eliquis 15: Hypothyroidism; continue Synthroid 16: Hypertension: continue to monitor 17: Seizure disorder: no recent seizure activity; continue Keppra 18: OSA: continue CPAP 19: DM: has been on regular diet without need for insulin coverage;  A1c = 5.1             --d/c SSI 20: Hyperlipidemia: continue Zetia, Lipitor 21. Cellulitis? R forearm- already on Unasyn- should cover- will monitor     I have personally performed a face to face diagnostic evaluation of this patient and formulated the key components of the plan.  Additionally, I have personally reviewed laboratory data, imaging studies, as well as relevant notes and concur with the physician assistant's documentation above.   The patient's status has not changed from the original H&P.  Any changes in documentation from the acute care chart have been noted above.       Barbie Banner, PA-C 05/22/2022

## 2022-05-22 NOTE — Discharge Summary (Signed)
Physician Discharge Summary   Patient: Gregory [name] MRN: [id number] DOB: 10-12-1961  Admit date:     05/08/2022  Discharge date: 05/22/22  Discharge Physician: Patrecia Pour   PCP: Patient, [name]   Recommendations at discharge:  Continue rehabilitation efforts at CIR Follow up with cardiology/EP regarding ventricular tachycardia. Note discharge on amiodarone '400mg'$  po BID and mexiletine '150mg'$  q8h pending their follow up Follow volume status, ideally with regular weights and I/O monitoring, and metabolic panels. Discharged on lasix '40mg'$  po daily (instead of MWF). He is volume up, but this is improving on oral diuretic alone.  Follow up with IR drain clinic after discharge s/p percutaneous drain for intraabdominal abscesses on 5/26.  ID to continue following, recommends transition to augmentin at discharge to CIR, currently planning 3-4 weeks further therapy, though repeat CT abd/pelvis may be helpful to inform this decision.  Follow up with general surgery after discharge from CIR.  Follow up with oncology, Dr. Burr Medico, with PET scan once acute illness has resolved.  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  Active Problems:   Ileus following gastrointestinal surgery (HCC)   Hypothyroidism   Leukocytosis   Cancer of right colon (HCC)   Chronic combined systolic and diastolic CHF (congestive heart failure) (HCC)/ischemic cardiomyopathy   CAD S/P percutaneous coronary angioplasty   Paroxysmal atrial fibrillation (HCC)   Controlled type 2 diabetes mellitus without complication, without long-term current use of insulin (HCC)   Essential hypertension   Seizures (HCC)   OSA on CPAP  Resolved Problems:   recurrent VT s/p AICD   Hospital Course: Gregory [name] is a 61 years old male with PMH significant for CAD s/p stenting, chronic systolic heart failure, recurrent VT with ICD placement, atrial  fibrillation, diabetes mellitus type 2, history of seizures, hypertension presented in the ED after his AICD gave him a shock.   Recent admission for partially obstructing abdominal mass s/p hemicolectomy(right) 04/18/22 - imaging here shows multiple abdominal lesions considered to be abscesses with pseudoaneurysm of the splenic artery. S/P perc drain and embolization respectively. Hemodynamically improved with antibiotics and titration of antiarrhythmics. CIR was recommended per therapy and will be pursued. Please note significant details below.   Assessment and Plan: Intraabdominal abscesses postoperatively, POA: s/p IR perc drain 5/26, upsized 5/31, continued purulent output advised to increase frequency of flushes 6/5.  - Continue frequent flushes and output monitoring. - Continue antibiotics per ID. Discussed with Dr. Baxter Flattery on day of discharge, ok to transition to augmentin. Plans to repeat abd CT and 3-4 more weeks of augmentin.   - Needs follow up in IR drain clinic - Follow up with general surgery - Follow up with [facility] after discharge.    Recurrent V.Tach, VT storm, s/p AICD shock: - Secondary to sepsis as above - Patient with history of recurrent VT reports AICD firing, admitted after shock from device. - EP advice appreciated, on amiodarone and mexiletine with improvement, none in the previous 24 hours on stable regimen which we will continue per their recommendations. They will arrange follow up.   Colon CA s/p right hemicolectomy 5/5: 11/35 nodes were positive; lymphovascular space invasion present; margins negative.  - Still planning PET as outpatient to inform oncologic treatment plan. - Follow up with Dr. Burr Medico. Per office visit 5/22: "I reviewed that he has at least locally advanced cancer, if PET scan is negative for distant metastasis. I discussed that he has a high  risk of recurrence (over 50%), the standard therapy and recommendation would be for adjuvant chemotherapy. I  discussed the different regimens. Given his current performance status and comorbidities, I recommend starting with oral Xeloda. If he improves, we can consider adding IV oxaliplatin, either FOLFOX or CAPOX.  Due to his significant cardiac comorbidities, if his performance status does not improve, he may not be a candidate for combined chemotherapy."   Chronic combined HFrEF, ICM: Echo 04/07/2022: LVEF 35-40%, worse compared to 09/22/2020 (LVEF 40-45%). Clinically euvolemic.  - Continue farxiga - BP improved, so will plan to continue home entresto, metoprolol. - Volume up, so giving lasix BID instead of home MWF. D/w cardiology who recommends daily dosing, monitor I/O, weights.    Paroxysmal atrial fibrillation: Currently NSR w/PVCs.  - Per EP. Continue metoprolol, amiodarone as above  Superficial phlebitis: s/p infiltration of amiodarone infusion 6/6. Not significantly tender, without evidence of infection.  - Local care recommended.    Left brachial acute DVT:  - Continue eliquis. - Elevate left arm.  Splenic pseudoaneurysm: s/p embolization.   Hypothyroidism:  - Continue synthroid, recheck TFTs at follow up   Right paraspinal lumbar back spasm:  - Muscle relaxer prn   CAD s/p PCI: Stable by cath 04/17/2022, no angina, flat troponins.  - No changes per cardiology   NIDT2DM: HbA1c 5.1%, well-controlled.  - Continue SGLT2i. Monitor CBGs, sensitive SSI   OSA:  - Continue CPAP qHS. Has required nocturnal oxygen supplementation while admitted, suspect this is chronic nocturnal hypoxia.    Seizure disorder:  - Continue keppra   HTN:  - As above  Consultants: IR, general surgery, EP/cardiology, ID Procedures performed: Percutaneous drain placement by IR Disposition:  CIR Diet recommendation:  Cardiac diet DISCHARGE MEDICATION: Allergies as of 05/22/2022   No Known Allergies      Medication List     TAKE these medications    acetaminophen 500 MG tablet Commonly known  as: TYLENOL Take 1,000 mg by mouth every 6 (six) hours as needed for moderate pain.   amiodarone 400 MG tablet Commonly known as: PACERONE Take 1 tablet (400 mg total) by mouth 2 (two) times daily. What changed:  medication strength when to take this   amoxicillin-clavulanate 875-125 MG tablet Commonly known as: AUGMENTIN Take 1 tablet by mouth every 12 (twelve) hours.   apixaban 5 MG Tabs tablet Commonly known as: Eliquis TAKE 1 TABLET(5 MG) BY MOUTH TWICE DAILY What changed:  how much to take how to take this when to take this additional instructions   atorvastatin 80 MG tablet Commonly known as: LIPITOR TAKE ONE TABLET BY MOUTH EVERY EVENING   clopidogrel 75 MG tablet Commonly known as: PLAVIX TAKE 1 TABLET(75 MG) BY MOUTH DAILY What changed: See the new instructions.   cyclobenzaprine 7.5 MG tablet Commonly known as: FEXMID Take 1 tablet (7.5 mg total) by mouth 3 (three) times daily as needed for muscle spasms.   dapagliflozin propanediol 10 MG Tabs tablet Commonly known as: Farxiga Take 1 tablet (10 mg total) by mouth daily. What changed: See the new instructions.   docusate sodium 100 MG capsule Commonly known as: COLACE Take 1 capsule (100 mg total) by mouth 2 (two) times daily.   ezetimibe 10 MG tablet Commonly known as: ZETIA Take 1 tablet (10 mg total) by mouth daily.   furosemide 40 MG tablet Commonly known as: LASIX Take 1 tablet (40 mg total) by mouth daily. What changed:  medication strength when to take this  levETIRAcetam 500 MG tablet Commonly known as: KEPPRA TAKE 1 TABLET(500 MG) BY MOUTH TWICE DAILY What changed: See the new instructions.   metoprolol succinate 25 MG 24 hr tablet Commonly known as: TOPROL-XL Take one tablet, 25 mg, in the morning and 50 mg, two tablets, in the evening. What changed:  how much to take additional instructions   mexiletine 150 MG capsule Commonly known as: MEXITIL Take 1 capsule (150 mg total) by  mouth every 8 (eight) hours.   oxyCODONE 5 MG immediate release tablet Commonly known as: Oxy IR/ROXICODONE Take 1-2 tablets (5-10 mg total) by mouth every 8 (eight) hours as needed for breakthrough pain.   pantoprazole 40 MG tablet Commonly known as: PROTONIX TAKE 1 TABLET(40 MG) BY MOUTH DAILY What changed:  how much to take how to take this when to take this   polyethylene glycol 17 g packet Commonly known as: MIRALAX / GLYCOLAX Take 17 g by mouth 2 (two) times daily.   potassium chloride 10 MEQ tablet Commonly known as: KLOR-CON M Take 1 tablet (10 mEq total) by mouth daily.   sacubitril-valsartan 49-51 MG Commonly known as: ENTRESTO Take 1 tablet by mouth 2 (two) times daily.   sildenafil 50 MG tablet Commonly known as: Viagra Take 1 tablet (50 mg total) by mouth as needed for erectile dysfunction.        Follow-up Information     [facility].   Specialty: Home Health Services Why: [place] to call with a visit time- If you need to call the office please call [phone].        Mir, [name] Libra, MD Follow up.   Specialties: Interventional Radiology, Diagnostic Radiology, Radiology Contact information: [place] [address] [address] [phone]         Stechschulte, Nickola Major, MD. Go on 06/06/2022.   Specialty: Surgery Why: 3:50 PM. Please arrive 30 min prior to appointment time. Have ID and any insurance information with you. Contact information: [place]. [address] [place] [address] [phone]         Shirley Friar, PA-C Follow up.   Specialty: Physician Assistant Why: 06/05/22 @ 11:40AM, for Dr. Margurite Auerbach information: [place] [address] [phone]         Carlyle Basques, MD Follow up.   Specialty: Infectious Diseases Contact information: [place]  [address] [phone]         [name], MD .   Specialty: Cardiology Contact information: [address] [address] [phone]                Discharge Exam: Danley Danker Weights   05/08/22 0841 05/08/22 1626  Weight: 95.7 kg 102.1 kg  BP (!) 134/97 (BP Location: Right Arm)   Pulse 81   Temp 98 F (36.7 C) (Oral)   Resp 18   Ht '6\' 6"'$  (1.981 m)   Wt 102.1 kg   SpO2 96%   BMI 26.01 kg/m   Chronically ill-appearing, tall male in no distress Regular with occasional premature beats (NSR w/PVCs on tele), no MRG. 1+ dependent edema bilaterally (improved).  Nonlabored, clear.  Abd soft, mildly appropriately tender to palpation without rebound or guarding, +BS, nondistended.  Perc drain with thin purulent effluent in JP drain, no discharge around drain. No periwound erythema. Nontender venous induration right forearm without fluctuance, stable from  yesterday. LUE diffusely edematous proximal > distally.  LE's with pitting edema to knees improving.  Condition at discharge: stable  The results of significant diagnostics from this hospitalization (including imaging, microbiology, ancillary and laboratory) are listed below for reference.   Imaging Studies: VAS [place] UPPER EXTREMITY VENOUS DUPLEX  Result Date: 05/15/2022 UPPER VENOUS STUDY  Patient Name:  Gregory [name]  Date of Exam:   05/15/2022 Medical Rec #: [id number]                  Accession #:    [id number] Date of Birth: [birth year]                  Patient Gender: M Patient Age:   32 years Exam Location:  [facility] Procedure:      VAS [place] UPPER EXTREMITY VENOUS DUPLEX Referring Phys: Yehuda Savannah OGBATA --------------------------------------------------------------------------------  Indications: Pain, and Swelling Limitations: Edema, pain with compression, defibrillator in left chest. Comparison Study: Prior left upper extremity venous duplex done 04/21/22                   indicating  age indeterminate basilic vein thrombus Performing Technologist: Sharion Dove RVS  Examination Guidelines: A complete evaluation includes B-mode imaging, spectral Doppler, color Doppler, and power Doppler as needed of all accessible portions of each vessel. Bilateral testing is considered an integral part of a complete examination. Limited examinations for reoccurring indications may be performed as noted.  Right Findings: +----------+------------+---------+-----------+----------+-------+ RIGHT     CompressiblePhasicitySpontaneousPropertiesSummary +----------+------------+---------+-----------+----------+-------+ Subclavian               Yes       Yes                      +----------+------------+---------+-----------+----------+-------+  Left Findings: +----------+------------+---------+-----------+----------+-------------------+ LEFT      CompressiblePhasicitySpontaneousProperties      Summary       +----------+------------+---------+-----------+----------+-------------------+ IJV           Full       Yes       Yes                                  +----------+------------+---------+-----------+----------+-------------------+ Subclavian               Yes       Yes                                  +----------+------------+---------+-----------+----------+-------------------+ Axillary                 Yes       Yes                                  +----------+------------+---------+-----------+----------+-------------------+ Brachial      None       No        No               acute mid upper arm +----------+------------+---------+-----------+----------+-------------------+ Radial        Full                                                      +----------+------------+---------+-----------+----------+-------------------+  Ulnar         Full                                                       +----------+------------+---------+-----------+----------+-------------------+ Cephalic      Full                                                      +----------+------------+---------+-----------+----------+-------------------+ Basilic       Full                                                      +----------+------------+---------+-----------+----------+-------------------+  Summary:  Right: No evidence of thrombosis in the subclavian.  Left: No evidence of superficial vein thrombosis in the upper extremity. Findings consistent with acute deep vein thrombosis involving the left brachial veins above AC, coursing to the mid upper arm.. Findings for superficial vein thrombosis appear resolved from previous study.  *See table(s) above for measurements and observations.  Diagnosing physician: Orlie Pollen Electronically signed by Orlie Pollen on 05/15/2022 at 5:24:01 PM.    Final    IR Catheter Tube Change  Result Date: 05/14/2022 CLINICAL DATA:  62 year old male with history of abdominal abscess status post percutaneous drain placement on 05/09/2022. Decreased drain output despite CT evidence of persistent large intra-abdominal fluid collection. EXAM: IR CATHETER TUBE CHANGE COMPARISON:  None Available. CONTRAST:  10 mL Omnipaque 300-administered via the percutaneous drainage catheter. MEDICATIONS: None. ANESTHESIA/SEDATION: Moderate (conscious) sedation was employed during this procedure. A total of Versed 2 mg and Fentanyl 100 mcg was administered intravenously. Moderate Sedation Time: 10 minutes. The patient's level of consciousness and vital signs were monitored continuously by radiology nursing throughout the procedure under my direct supervision. FLUOROSCOPY TIME:  1 minute, 30 mGy TECHNIQUE: Patient was positioned supine on the fluoroscopy table. The external portion of the existing percutaneous drainage catheter as well as the surrounding skin was prepped and draped in usual sterile  fashion. A preprocedural spot fluoroscopic image was obtained of the existing percutaneous drainage catheter. A small amount of contrast was injected via the existing percutaneous drainage catheter and several fluoroscopic images were obtained. The external portion of the percutaneous drainage catheter was cut and cannulated with a short Amplatz wire. Under intermittent fluoroscopic guidance, the existing percutaneous drainage catheter was repositioned and exchanged for a new 67 [place], biliary type percutaneous drainage catheter with end coiled and locked within the lower midline abdomen. Contrast injection confirmed appropriate position functionality of the percutaneous drainage catheter. Approximately 100 mL of opaque, yellow-green malodorous fluid was aspirated. The percutaneous drainage catheter was connected to a suction bulb and secured in place within interrupted suture and a StatLock device. A dressing was applied. The patient tolerated the procedure well without immediate postprocedural complication. FINDINGS: Indwelling 31 French catheter adjacent to the right lobe of the liver, inappropriately draining abdominal fluid collection. Successful drain repositioning to midline lower abdomen yielding approximately 100 mL purulent fluid. IMPRESSION: Successful drain exchange and repositioning for a 21 [place], biliary type drainage  catheter positioned in the midline lower abdomen yielding approximately 100 mL of purulent fluid. PLAN: Keep to bulb suction.  Increase frequency in volume of flushing. [name], MD [facility] Radiology Electronically Signed   By: [name] M.D.   On: 05/14/2022 13:33   VAS [place] LOWER EXTREMITY VENOUS (DVT)  Result Date: 05/13/2022  Lower Venous DVT Study Patient Name:  Gregory [name]  Date of Exam:   05/12/2022 Medical Rec #: [id number]                  Accession #:    [id number] Date of Birth: 24-Aug-1961                   Patient Gender: M Patient Age:   9 years Exam Location:  [facility] Procedure:      VAS [place] LOWER EXTREMITY VENOUS (DVT) Referring Phys: Shawna Clamp --------------------------------------------------------------------------------  Indications: Asymmetrical swelling- left leg.  Risk Factors: Cancer - right colon. Comparison Study: No prior lower extremity studies. Performing Technologist: Darlin Coco RDMS, RVT  Examination Guidelines: A complete evaluation includes B-mode imaging, spectral Doppler, color Doppler, and power Doppler as needed of all accessible portions of each vessel. Bilateral testing is considered an integral part of a complete examination. Limited examinations for reoccurring indications may be performed as noted. The reflux portion of the exam is performed with the patient in reverse Trendelenburg.  +-----+---------------+---------+-----------+----------+--------------+ RIGHTCompressibilityPhasicitySpontaneityPropertiesThrombus Aging +-----+---------------+---------+-----------+----------+--------------+ CFV  Full           Yes      Yes                                 +-----+---------------+---------+-----------+----------+--------------+   +---------+---------------+---------+-----------+----------+--------------+ LEFT     CompressibilityPhasicitySpontaneityPropertiesThrombus Aging +---------+---------------+---------+-----------+----------+--------------+ CFV      Full           Yes      Yes                                 +---------+---------------+---------+-----------+----------+--------------+ SFJ      Full                                                        +---------+---------------+---------+-----------+----------+--------------+ FV Prox  Full                                                        +---------+---------------+---------+-----------+----------+--------------+ FV Mid   Full                                                         +---------+---------------+---------+-----------+----------+--------------+ FV DistalFull                                                        +---------+---------------+---------+-----------+----------+--------------+  PFV      Full                                                        +---------+---------------+---------+-----------+----------+--------------+ POP      Full           Yes      Yes                                 +---------+---------------+---------+-----------+----------+--------------+ PTV      Full                                                        +---------+---------------+---------+-----------+----------+--------------+ PERO     Full                                                        +---------+---------------+---------+-----------+----------+--------------+ Gastroc  Full                                                        +---------+---------------+---------+-----------+----------+--------------+     Summary: RIGHT: - No evidence of common femoral vein obstruction.  LEFT: - There is no evidence of deep vein thrombosis in the lower extremity.  - No cystic structure found in the popliteal fossa.  *See table(s) above for measurements and observations. Electronically signed by Deitra Mayo MD on 05/13/2022 at 10:52:47 AM.    Final    DG Abd 2 Views  Result Date: 05/13/2022 CLINICAL DATA:  The retroperitoneal hemorrhage, left-sided abdominal pain, constipation EXAM: ABDOMEN - 2 VIEW COMPARISON:  CT 05/12/2022 FINDINGS: No free air on erect radiographs. AICD leads are partially visualized. Atelectasis/consolidation at the left lung base. Stomach appears decompressed. Multiple gas filled small bowel loops throughout the abdomen. There is mild gaseous distention of the transverse colon, remainder of colon is nondilated. Stable pigtail catheter in the right lateral abdomen. Multiple splenic artery embolization coils  noted. IMPRESSION: Nonobstructive bowel gas pattern. No free air. Electronically Signed   By: Lucrezia Europe M.D.   On: 05/13/2022 08:28   CT ABDOMEN PELVIS W CONTRAST  Result Date: 05/12/2022 CLINICAL DATA:  Follow-up percutaneously drained abdominal abscess performed on 05/09/2022. 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 EXAM: CT ABDOMEN AND PELVIS WITH CONTRAST TECHNIQUE: Multidetector CT imaging of the abdomen and pelvis was performed using the standard protocol following bolus administration of intravenous contrast. RADIATION DOSE REDUCTION: This exam was performed according to the departmental dose-optimization program which includes automated exposure control, adjustment of the mA and/or kV according to patient size and/or use of iterative reconstruction technique. CONTRAST:  126m OMNIPAQUE IOHEXOL 300 MG/ML  SOLN  COMPARISON:  06/03/2022 and 05/08/2022. FINDINGS: Lower chest: Moderate-sized left pleural effusion with a mild increase in size. Stable small right pleural effusion. Increased left lower lobe atelectasis. Stable right lower lobe atelectasis. The heart remains enlarged, especially the left atrium. Right ventricular AICD leads are again demonstrated. Atheromatous calcifications, including the coronary arteries and aorta. Hepatobiliary: Multiple small liver cysts. Sludge and 4 mm gallstone in the dependent portion of the gallbladder. No gallbladder wall thickening. Minimal pericholecystic fluid. Pancreas: No significant change in the previously demonstrated bilobed fluid collection in the region of the pancreatic tail with an oval area of persistent higher density laterally. Interval embolization coil artifacts at that location. Spleen: Anterior, superior splenic infarct. The remainder of the spleen is unremarkable. Adrenals/Urinary Tract: Stable  mild bilateral adrenal hyperplasia. Bilateral renal cysts. Poorly distended bladder with mild to moderate diffuse wall thickening. Unremarkable ureters. Mild urinary tract calculi or hydronephrosis. Stomach/Bowel: Multiple mildly to moderately dilated loops of small bowel and transverse colon. Small number of sigmoid colon diverticula. Unremarkable stomach. 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 No enlarged lymph nodes. Reproductive: Prostate is unremarkable. Other: Interval right lateral percutaneous drainage catheter within the lateral aspect of the previously demonstrated 25.0 x 15.5 cm fluid and gas collection in the right mid abdomen and extending superiorly lateral to the liver. That collection is significantly smaller, measuring 14.9 x 7.7 cm at the same level on image number 60/3. In the upper pelvis, this measures 19.4 x 6.9 cm on image number 73/3, previously 28.2 x 14.8 cm. A left lateral fluid collection is more oval in configuration with a mild increase in size. This communicates with the pelvic portion of the larger, drained collection, currently with a thin surrounding enhancing rim. This collection measures 15.9 x 5.9 cm on image number [id number]. On 05/09/2022, this measured 10.9 x 4.5 cm in corresponding dimensions. Small amount of free peritoneal fluid.  Diffuse subcutaneous edema. Musculoskeletal: Lumbar and lower thoracic spine degenerative changes. IMPRESSION: 1. Interval significant decrease in size of the large abdominal and pelvic abscess with a moderately large collection of gas and fluid remaining. 2. Small amount of ascites. 3. Cholelithiasis and mild sludge in the gallbladder without evidence of cholecystitis. 4. Moderate-sized left pleural effusion, mildly increased. 5. Small right pleural effusion, unchanged. 6. Bilateral lower lobe atelectasis, increased on the left. 7.  Diffuse subcutaneous edema. Electronically Signed   By: Claudie Revering M.D.   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 Ultrasound-guided right abdominal abscess drain placement 2. Ultrasound-guided access of right common femoral artery 3. Celiac angiogram 4. Splenic artery angiogram and distal splenic branch embolization (x2) 5. Angio-Seal closure of right common femoral artery access MEDICATIONS: Patient currently on inpatient regiment of IV Zosyn. ANESTHESIA/SEDATION: Moderate (conscious) sedation was employed during this procedure. A total of Versed 1 mg and Fentanyl 50 mcg was administered intravenously by the radiology nurse. Total intra-service moderate Sedation Time: 160 minutes. The patient's level of consciousness and vital signs were monitored continuously by radiology nursing throughout the procedure under my direct supervision. CONTRAST:  130 mL intra arterial Omnipaque 300  FLUOROSCOPY: Radiation Exposure Index (as provided by the fluoroscopic device): 767 mGy Kerma COMPLICATIONS: None immediate. PROCEDURE: Informed consent was obtained from the patient following explanation of the procedure, risks, benefits and alternatives. The patient understands, agrees and consents for the procedure. All questions were addressed. A time out was performed prior to the initiation of the procedure. Maximal barrier sterile technique utilized including caps, mask, sterile gowns, sterile gloves, large sterile drape, hand hygiene, and chlorhexidine prep. Right abdominal wall skin prepped and draped in usual fashion. Ultrasound evaluation  showed large loculated abscess. Following local lidocaine administration, 19 gauge Yueh needle was advanced into the right abdominal collection using continuous ultrasound guidance. Yueh catheter removed over 0.035 inch guidewire, serial dilation performed, and 14.0 [place] multipurpose pigtail drain placed. 20 mL purulent sample aspirated and sent for Gram stain and culture. Total of 2700 mL of purulent fluid drained from the collection. Drain secured to skin with suture and connected to bag. Right groin skin prepped and draped usual fashion. Ultrasound image documenting patency of the right common femoral artery was obtained and placed in permanent medical record. Sterile ultrasound probe cover and gel utilized throughout the procedure. Utilizing continuous ultrasound guidance, the right common femoral artery was accessed at the level of the femoral head with a 21 gauge needle. 21 gauge needle exchanged for a transitional dilator set over 0.018 inch guidewire. Transitional dilator set exchanged for 5 French sheath over 0.035 inch guidewire. Sos omni catheter utilized to select the celiac artery. 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 Common hepatic, left gastric, and splenic arteries are patent. 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 Lantern microcatheter was advanced into the splenic artery, however the base catheter was pushed out while trying to advance the microcatheter. Sos omni catheter was exchanged for a C2 catheter. C2 catheter advanced into the splenic artery utilizing Glidewire. Splenic angiogram again showed filling of the pseudoaneurysm. Lantern microcatheter advanced further into the splenic artery and repeat angiogram was performed. There was intermittent filling of the pseudoaneurysm it was very difficult to determine which branch of the splenic artery supplied the pseudoaneurysm. Lantern microcatheter advanced  into 1 of the splenic artery branches nearest the pseudoaneurysm and angiogram was performed. Although no direct filling of the pseudoaneurysm was seen, irregular vessels were identified in this area. This artery was embolized with combination of 4 mm Ruby coils. The next closest branch of the splenic artery was selected and angiogram was performed which showed flow into the pseudoaneurysm. The microcatheter was advanced beyond the origin of the pseudoaneurysm and the artery was embolized with 4 mm Ruby coils. The microcatheter was then retracted and embolization was completed with 4 mm Ruby coils. Microcatheter was removed. Splenic angiogram showed no additional flow into the pseudoaneurysm. Sheath angiogram demonstrated appropriate puncture at the level of the common femoral head. The groin was re-prepped and draped and closure was performed with 6 [place] Angio-Seal device. IMPRESSION: 1. 14.0 French drain placed in right abdominal abscess, removing 2700 mL of purulent fluid. 2. Splenic angiogram shows active extravasation from distal splenic branch, 2 of which were coil embolized. The branch responsible for directly supplying the pseudoaneurysm was successfully embolized distal and proximal to origin of the pseudoaneurysm. Electronically Signed   By: Miachel Roux M.D.   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 Ultrasound-guided right abdominal abscess drain placement 2. Ultrasound-guided access of right common femoral artery 3. Celiac angiogram 4. Splenic artery angiogram and distal splenic branch embolization (x2) 5. Angio-Seal closure of right common  femoral artery access MEDICATIONS: Patient currently on inpatient regiment of IV Zosyn. ANESTHESIA/SEDATION: Moderate (conscious) sedation was employed during this procedure. A total of Versed 1 mg and Fentanyl 50 mcg was administered intravenously by the radiology nurse. Total intra-service moderate Sedation Time: 160 minutes. The patient's level of consciousness and vital signs were monitored continuously by radiology nursing throughout the procedure under my direct supervision. CONTRAST:  130 mL intra arterial Omnipaque 300 FLUOROSCOPY: Radiation Exposure Index (as provided by the fluoroscopic device): 258 mGy Kerma COMPLICATIONS: None immediate. PROCEDURE: Informed consent was obtained from the patient following explanation of the procedure, risks, benefits and alternatives. The patient understands, agrees and consents for the procedure. All questions were addressed. A time out was performed prior to the initiation of the procedure. Maximal barrier sterile technique utilized including caps, mask, sterile gowns, sterile gloves, large sterile drape, hand hygiene, and chlorhexidine prep. Right abdominal wall skin prepped and draped in usual fashion. Ultrasound evaluation showed large loculated abscess. Following local lidocaine administration, 19 gauge Yueh needle was advanced into the right abdominal collection using continuous ultrasound guidance. Yueh catheter removed over 0.035 inch guidewire, serial dilation performed, and 14.0 [place] multipurpose pigtail drain placed. 20 mL purulent sample aspirated and sent for Gram stain and culture. Total of 2700 mL of purulent fluid drained from the collection. Drain secured to skin with suture and connected to bag. Right groin skin prepped and draped usual fashion. Ultrasound image documenting patency of the right common femoral artery was obtained and placed in permanent medical record. Sterile ultrasound probe cover and gel utilized throughout the procedure. Utilizing  continuous ultrasound guidance, the right common femoral artery was accessed at the level of the femoral head with a 21 gauge needle. 21 gauge needle exchanged for a transitional dilator set over 0.018 inch guidewire. Transitional dilator set exchanged for 5 French sheath over 0.035 inch guidewire. Sos omni catheter utilized to select the celiac artery. 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 Common hepatic, left gastric, and splenic arteries are patent. 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 Lantern microcatheter was advanced into the splenic artery, however the base catheter was pushed out while trying to advance the microcatheter. Sos omni catheter was exchanged for a C2 catheter. C2 catheter advanced into the splenic artery utilizing Glidewire. Splenic angiogram again showed filling of the pseudoaneurysm. Lantern microcatheter advanced further into the splenic artery and repeat angiogram was performed. There was intermittent filling of the pseudoaneurysm it was very difficult to determine which branch of the splenic artery supplied the pseudoaneurysm. Lantern microcatheter advanced into 1 of the splenic artery branches nearest the pseudoaneurysm and angiogram was performed. Although no direct filling of the pseudoaneurysm was seen, irregular vessels were identified in this area. This artery was embolized with combination of 4 mm Ruby coils. The next closest branch of the splenic artery was selected and angiogram was performed which showed flow into the pseudoaneurysm. The microcatheter was advanced beyond the origin of the pseudoaneurysm and the artery was embolized with 4 mm Ruby coils. The microcatheter was then retracted  and embolization was completed with 4 mm Ruby coils. Microcatheter was removed. Splenic angiogram showed no additional flow into the pseudoaneurysm. Sheath angiogram demonstrated appropriate puncture at  the level of the common femoral head. The groin was re-prepped and draped and closure was performed with 6 [place] Angio-Seal device. IMPRESSION: 1. 14.0 French drain placed in right abdominal abscess, removing 2700 mL of purulent fluid. 2. Splenic angiogram shows active extravasation from distal splenic branch, 2 of which were coil embolized. The branch responsible for directly supplying the pseudoaneurysm was successfully embolized distal and proximal to origin of the pseudoaneurysm. Electronically Signed   By: Miachel Roux M.D.   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 Ultrasound-guided right abdominal abscess drain placement 2. Ultrasound-guided access of right common femoral artery 3. Celiac angiogram 4. Splenic artery angiogram and distal splenic branch embolization (x2) 5. Angio-Seal closure of right common femoral artery access MEDICATIONS: Patient currently on inpatient regiment of IV Zosyn. ANESTHESIA/SEDATION: Moderate (conscious) sedation was employed during this procedure. A total of Versed 1 mg and Fentanyl 50 mcg was administered intravenously by the radiology nurse. Total intra-service moderate Sedation Time: 160 minutes. The patient's level of consciousness and vital signs were monitored continuously by radiology nursing throughout the procedure under my direct supervision. CONTRAST:  130 mL intra arterial Omnipaque 300 FLUOROSCOPY: Radiation Exposure Index (as provided by the fluoroscopic device): 916 mGy Kerma COMPLICATIONS: None immediate. PROCEDURE: Informed consent was obtained from the patient following explanation of the procedure,  risks, benefits and alternatives. The patient understands, agrees and consents for the procedure. All questions were addressed. A time out was performed prior to the initiation of the procedure. Maximal barrier sterile technique utilized including caps, mask, sterile gowns, sterile gloves, large sterile drape, hand hygiene, and chlorhexidine prep. Right abdominal wall skin prepped and draped in usual fashion. Ultrasound evaluation showed large loculated abscess. Following local lidocaine administration, 19 gauge Yueh needle was advanced into the right abdominal collection using continuous ultrasound guidance. Yueh catheter removed over 0.035 inch guidewire, serial dilation performed, and 14.0 [place] multipurpose pigtail drain placed. 20 mL purulent sample aspirated and sent for Gram stain and culture. Total of 2700 mL of purulent fluid drained from the collection. Drain secured to skin with suture and connected to bag. Right groin skin prepped and draped usual fashion. Ultrasound image documenting patency of the right common femoral artery was obtained and placed in permanent medical record. Sterile ultrasound probe cover and gel utilized throughout the procedure. Utilizing continuous ultrasound guidance, the right common femoral artery was accessed at the level of the femoral head with a 21 gauge needle. 21 gauge needle exchanged for a transitional dilator set over 0.018 inch guidewire. Transitional dilator set exchanged for 5 French sheath over 0.035 inch guidewire. Sos omni catheter utilized to select the celiac artery. 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 Common hepatic, left gastric, and splenic arteries are patent. 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 Lantern microcatheter was advanced into the splenic artery, however the base catheter was pushed out while trying to advance the microcatheter. Sos omni catheter was  exchanged for a C2 catheter.  C2 catheter advanced into the splenic artery utilizing Glidewire. Splenic angiogram again showed filling of the pseudoaneurysm. Lantern microcatheter advanced further into the splenic artery and repeat angiogram was performed. There was intermittent filling of the pseudoaneurysm it was very difficult to determine which branch of the splenic artery supplied the pseudoaneurysm. Lantern microcatheter advanced into 1 of the splenic artery branches nearest the pseudoaneurysm and angiogram was performed. Although no direct filling of the pseudoaneurysm was seen, irregular vessels were identified in this area. This artery was embolized with combination of 4 mm Ruby coils. The next closest branch of the splenic artery was selected and angiogram was performed which showed flow into the pseudoaneurysm. The microcatheter was advanced beyond the origin of the pseudoaneurysm and the artery was embolized with 4 mm Ruby coils. The microcatheter was then retracted and embolization was completed with 4 mm Ruby coils. Microcatheter was removed. Splenic angiogram showed no additional flow into the pseudoaneurysm. Sheath angiogram demonstrated appropriate puncture at the level of the common femoral head. The groin was re-prepped and draped and closure was performed with 6 [place] Angio-Seal device. IMPRESSION: 1. 14.0 French drain placed in right abdominal abscess, removing 2700 mL of purulent fluid. 2. Splenic angiogram shows active extravasation from distal splenic branch, 2 of which were coil embolized. The branch responsible for directly supplying the pseudoaneurysm was successfully embolized distal and proximal to origin of the pseudoaneurysm. Electronically Signed   By: Miachel Roux M.D.   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 Ultrasound-guided right abdominal abscess drain placement 2. Ultrasound-guided access of right common femoral artery 3. Celiac angiogram 4. Splenic artery angiogram and distal splenic branch embolization (x2) 5. Angio-Seal closure of right common femoral artery access MEDICATIONS: Patient currently on inpatient regiment of IV Zosyn. ANESTHESIA/SEDATION: Moderate (conscious) sedation was employed during this procedure. A total of Versed 1 mg and Fentanyl 50 mcg was administered intravenously by the radiology nurse. Total intra-service moderate Sedation Time: 160 minutes. The patient's level of consciousness and vital signs were monitored continuously by radiology nursing throughout the procedure under my direct supervision. CONTRAST:  130 mL intra arterial Omnipaque 300 FLUOROSCOPY: Radiation Exposure Index (as provided by the fluoroscopic device): 710 mGy Kerma COMPLICATIONS: None immediate. PROCEDURE: Informed consent was obtained from the patient following explanation of the procedure, risks, benefits and alternatives. The patient understands, agrees and consents for the procedure. All questions were addressed. A time out was performed prior to the initiation of the procedure. Maximal barrier sterile technique utilized including caps, mask, sterile gowns, sterile gloves, large sterile drape, hand hygiene, and chlorhexidine prep. Right abdominal wall skin prepped and draped in usual fashion. Ultrasound evaluation showed large loculated abscess. Following local lidocaine administration, 19 gauge Yueh needle was advanced into the right abdominal collection using continuous ultrasound guidance. Yueh catheter removed over 0.035 inch guidewire, serial dilation performed, and 14.0 [place] multipurpose pigtail drain  placed. 20 mL purulent sample aspirated and sent for Gram stain and culture. Total of 2700 mL of purulent fluid drained from the collection. Drain secured to skin with suture and connected to bag. Right groin skin prepped and draped usual fashion. Ultrasound image documenting patency of the right common  femoral artery was obtained and placed in permanent medical record. Sterile ultrasound probe cover and gel utilized throughout the procedure. Utilizing continuous ultrasound guidance, the right common femoral artery was accessed at the level of the femoral head with a 21 gauge needle. 21 gauge needle exchanged for a transitional dilator set over 0.018 inch guidewire. Transitional dilator set exchanged for 5 French sheath over 0.035 inch guidewire. Sos omni catheter utilized to select the celiac artery. 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 Common hepatic, left gastric, and splenic arteries are patent. 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 Lantern microcatheter was advanced into the splenic artery, however the base catheter was pushed out while trying to advance the microcatheter. Sos omni catheter was exchanged for a C2 catheter. C2 catheter advanced into the splenic artery utilizing Glidewire. Splenic angiogram again showed filling of the pseudoaneurysm. Lantern microcatheter advanced further into the splenic artery and repeat angiogram was performed. There was intermittent filling of the pseudoaneurysm it was very difficult to determine which branch of the splenic artery supplied the pseudoaneurysm. Lantern microcatheter advanced into 1 of the splenic artery branches nearest the pseudoaneurysm and angiogram was performed. Although no direct filling of the pseudoaneurysm was seen, irregular vessels were identified in this area. This artery was embolized with combination of 4 mm Ruby coils. The next closest branch of the  splenic artery was selected and angiogram was performed which showed flow into the pseudoaneurysm. The microcatheter was advanced beyond the origin of the pseudoaneurysm and the artery was embolized with 4 mm Ruby coils. The microcatheter was then retracted and embolization was completed with 4 mm Ruby coils. Microcatheter was removed. Splenic angiogram showed no additional flow into the pseudoaneurysm. Sheath angiogram demonstrated appropriate puncture at the level of the common femoral head. The groin was re-prepped and draped and closure was performed with 6 [place] Angio-Seal device. IMPRESSION: 1. 14.0 French drain placed in right abdominal abscess, removing 2700 mL of purulent fluid. 2. Splenic angiogram shows active extravasation from distal splenic branch, 2 of which were coil embolized. The branch responsible for directly supplying the pseudoaneurysm was successfully embolized distal and proximal to origin of the pseudoaneurysm. Electronically Signed   By: Miachel Roux M.D.   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 Ultrasound-guided right abdominal abscess drain placement 2. Ultrasound-guided access of right common femoral artery 3. Celiac angiogram 4. Splenic artery angiogram and distal splenic branch embolization (x2) 5. Angio-Seal closure of right common femoral artery access MEDICATIONS: Patient currently on inpatient regiment of IV Zosyn. ANESTHESIA/SEDATION: Moderate (conscious) sedation was employed during this procedure. A total of Versed 1 mg and Fentanyl 50 mcg was administered intravenously by the radiology  nurse. Total intra-service moderate Sedation Time: 160 minutes. The patient's level of consciousness and vital signs were monitored continuously by radiology nursing throughout the procedure under my direct supervision. CONTRAST:  130 mL intra arterial Omnipaque 300 FLUOROSCOPY: Radiation Exposure Index (as provided by the fluoroscopic device): 195 mGy Kerma COMPLICATIONS: None immediate. PROCEDURE: Informed consent was obtained from the patient following explanation of the procedure, risks, benefits and alternatives.  The patient understands, agrees and consents for the procedure. All questions were addressed. A time out was performed prior to the initiation of the procedure. Maximal barrier sterile technique utilized including caps, mask, sterile gowns, sterile gloves, large sterile drape, hand hygiene, and chlorhexidine prep. Right abdominal wall skin prepped and draped in usual fashion. Ultrasound evaluation showed large loculated abscess. Following local lidocaine administration, 19 gauge Yueh needle was advanced into the right abdominal collection using continuous ultrasound guidance. Yueh catheter removed over 0.035 inch guidewire, serial dilation performed, and 14.0 [place] multipurpose pigtail drain placed. 20 mL purulent sample aspirated and sent for Gram stain and culture. Total of 2700 mL of purulent fluid drained from the collection. Drain secured to skin with suture and connected to bag. Right groin skin prepped and draped usual fashion. Ultrasound image documenting patency of the right common femoral artery was obtained and placed in permanent medical record. Sterile ultrasound probe cover and gel utilized throughout the procedure. Utilizing continuous ultrasound guidance, the right common femoral artery was accessed at the level of the femoral head with a 21 gauge needle. 21 gauge needle exchanged for a transitional dilator set over 0.018 inch guidewire. Transitional dilator set exchanged for 5 French  sheath over 0.035 inch guidewire. Sos omni catheter utilized to select the celiac artery. 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 Common hepatic, left gastric, and splenic arteries are patent. 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 Lantern microcatheter was advanced into the splenic artery, however the base catheter was pushed out while trying to advance the microcatheter. Sos omni catheter was exchanged for a C2 catheter. C2 catheter advanced into the splenic artery utilizing Glidewire. Splenic angiogram again showed filling of the pseudoaneurysm. Lantern microcatheter advanced further into the splenic artery and repeat angiogram was performed. There was intermittent filling of the pseudoaneurysm it was very difficult to determine which branch of the splenic artery supplied the pseudoaneurysm. Lantern microcatheter advanced into 1 of the splenic artery branches nearest the pseudoaneurysm and angiogram was performed. Although no direct filling of the pseudoaneurysm was seen, irregular vessels were identified in this area. This artery was embolized with combination of 4 mm Ruby coils. The next closest branch of the splenic artery was selected and angiogram was performed which showed flow into the pseudoaneurysm. The microcatheter was advanced beyond the origin of the pseudoaneurysm and the artery was embolized with 4 mm Ruby coils. The microcatheter was then retracted and embolization was completed with 4 mm Ruby coils. Microcatheter was removed. Splenic angiogram showed no additional flow into the pseudoaneurysm. Sheath angiogram demonstrated appropriate puncture at the level of the common femoral head. The groin was re-prepped and draped and closure was performed with 6 [place] Angio-Seal device. IMPRESSION: 1. 14.0 French drain placed in right abdominal abscess, removing 2700 mL of purulent fluid. 2. Splenic angiogram shows  active extravasation from distal splenic branch, 2 of which were coil embolized. The branch responsible for directly supplying the pseudoaneurysm was successfully embolized distal and proximal to origin of the pseudoaneurysm. Electronically Signed   By: Miachel Roux M.D.   On: 05/10/2022 11:19   CT ANGIO ABDOMEN W &/OR WO CONTRAST  Result Date: 05/09/2022 CLINICAL DATA:  Splenic artery dissection. EXAM: CT ANGIOGRAPHY ABDOMEN TECHNIQUE: Multidetector CT imaging of the abdomen was performed using the standard protocol during bolus administration of intravenous contrast. Multiplanar reconstructed images and MIPs were obtained and reviewed to evaluate the vascular anatomy. RADIATION  DOSE REDUCTION: This exam was performed according to the departmental dose-optimization program which includes automated exposure control, adjustment of the mA and/or kV according to patient size and/or use of iterative reconstruction technique. CONTRAST:  44m OMNIPAQUE IOHEXOL 350 MG/ML SOLN COMPARISON:  CT of May 08, 2022. FINDINGS: VASCULAR Aorta: Atherosclerosis of abdominal aorta is noted without dissection. 3 cm fusiform infrarenal abdominal aortic aneurysm is noted. 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 No thrombus is noted. 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 The common hepatic, left gastric and splenic arteries appear to be patent. There does appear to be some extravasation of contrast arising from a distal branch of the splenic artery in the region of the splenic hilum, best seen on image number 33 of series 5. 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 SMA: Patent without evidence of aneurysm, dissection, vasculitis or significant stenosis. Renals: Both renal arteries are patent without evidence of aneurysm, dissection, vasculitis, fibromuscular dysplasia or significant  stenosis. IMA: Patent without evidence of aneurysm, dissection, vasculitis or significant stenosis. Inflow: Patent without evidence of aneurysm, dissection, vasculitis or significant stenosis. Veins: No obvious venous abnormality within the limitations of this arterial phase study. Review of the MIP images confirms the above findings. NON-VASCULAR Lower chest: Bilateral pleural effusions are noted with adjacent subsegmental atelectasis. Hepatobiliary: Gallstone is noted. Dilated gallbladder is noted. No biliary dilatation is noted. Liver is otherwise unremarkable. Pancreas: There is again noted peripancreatic fluid collections including 10.6 x 4.4 cm bilobed abnormality which appears to contain a pseudoaneurysm as described above. Spleen: Normal in size. No definite splenic parenchymal abnormality is noted. Adrenals/Urinary Tract: Bilateral adrenal nodules are again noted. No hydronephrosis or renal obstruction is noted. Stomach/Bowel: Stomach is unremarkable. No definite evidence of bowel obstruction is noted. Lymphatic: No definite adenopathy is noted. Other: There is again noted a large complex fluid collection involving most of the right-sided the abdomen that measures 25 x 15 cm. Multiple air-fluid levels are noted within it suggesting internal loculations. Musculoskeletal: No acute or significant osseous findings. 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 There is also noted small amount of contrast extravasation involving a distal branch of the splenic artery which is seen supplying pseudoaneurysm in the splenic hilum, suggesting arterial injury. These results will be called to the ordering clinician or representative by the Radiologist Assistant, and communication documented in the PACS or zVision Dashboard. 3 cm infrarenal abdominal aortic aneurysm is noted. Recommend follow-up ultrasound every 3 years. This  recommendation follows ACR consensus guidelines: White Paper of the ACR Incidental Findings Committee II on Vascular Findings. J Am Coll Radiol 2013; 10:789-794. NON-VASCULAR Continued presence of large complex fluid collection in the right-sided the abdomen measuring 25 x 15 cm. This is concerning for abscess. Multiple air-fluid levels are noted within it suggesting internal loculations. Continued presence of peripancreatic fluid collections including 10.6 x 4.4 cm bilobed abnormality around the pancreatic tail which contains pseudoaneurysm described above. Bilateral adrenal nodules are again noted. Bilateral pleural effusions are again noted with associated atelectasis. Aortic Atherosclerosis (ICD10-I70.0). Electronically Signed   By: JMarijo ConceptionM.D.   On: 05/09/2022 09:52   CT ABDOMEN PELVIS W CONTRAST  Addendum Date: 05/08/2022   ADDENDUM REPORT: 05/08/2022 19:34 ADDENDUM: These results were called by telephone at the  time of interpretation on 05/08/2022 at 7:15 pm to provider Dr Georganna Skeans, who verbally acknowledged these results. Electronically Signed   By: Julian Hy M.D.   On: 05/08/2022 19:34   Result Date: 05/08/2022 CLINICAL DATA:  Abdominal pain, ileus, status post surgery EXAM: CT ABDOMEN AND PELVIS WITH CONTRAST TECHNIQUE: Multidetector CT imaging of the abdomen and pelvis was performed using the standard protocol following bolus administration of intravenous contrast. RADIATION DOSE REDUCTION: This exam was performed according to the departmental dose-optimization program which includes automated exposure control, adjustment of the mA and/or kV according to patient size and/or use of iterative reconstruction technique. CONTRAST:  171m OMNIPAQUE IOHEXOL 300 MG/ML  SOLN COMPARISON:  Abdominal radiograph dated 05/08/2022. CT abdomen/pelvis dated 04/16/2022. FINDINGS: Lower chest: Moderate left and small right pleural effusions. Bilateral lower lobe atelectasis. Hepatobiliary:  Subcentimeter hepatic cysts. Gallbladder is notable for a 6 mm layering gallstone (series 3/image 40). No associated inflammatory changes. No intrahepatic or extrahepatic duct dilatation. Pancreas: Pancreas is notable for peripancreatic fluid collections, including a dominant 4.9 cm collection along the pancreatic tail (series 3/image 28), previously 4.4 cm. Within this collection, there has been development of a 2.7 cm pseudoaneurysm (series 3/image 25), which is new from the prior. Spleen: Within normal limits. Adrenals/Urinary Tract: Multiple bilateral adrenal nodules, measuring up to 2.0 cm on the left (series 8/image 15), likely reflecting benign adrenal adenomas, unchanged. Multiple bilateral renal cysts, measuring up to 2.7 cm in the medial left upper pole (series 8/image 18), benign. No hydronephrosis. Bladder is within normal limits. Stomach/Bowel: Stomach is notable for a tiny hiatal hernia. Status post right hemicolectomy with appendectomy. No evidence of bowel obstruction. Vascular/Lymphatic: Infrarenal abdominal aorta measures up to 3.0 cm (series 3/image 56). Atherosclerotic calcifications of the abdominal aorta and branch vessels. No suspicious abdominopelvic lymphadenopathy. Reproductive: Prostate is unremarkable. Other: Large fluid in gas collection with enhancing rim in the abdomen/pelvis, measuring at least 14.8 x 28.2 x 20.6 cm, reflecting a postoperative abscess. Additional mild abdominopelvic ascites. No free air. Musculoskeletal: Mild degenerative changes of the visualized thoracolumbar spine. IMPRESSION: Large postoperative abscess in the abdomen/pelvis, measuring at least 14.8 x 28.2 x 20.6 cm. Consider surgical washout versus drainage by interventional radiology, as clinically warranted. 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 Interventional radiology consultation is suggested for  embolization. Additional ancillary findings as above. Aortic Atherosclerosis (ICD10-I70.0). Electronically Signed: By: SJulian HyM.D. On: 05/08/2022 19:09   DG Abdomen Acute W/Chest  Result Date: 05/08/2022 CLINICAL DATA:  Abdominal pain EXAM: DG ABDOMEN ACUTE WITH 1 VIEW CHEST COMPARISON:  Radiograph 04/21/2022 FINDINGS: Diffuse gaseous distension of small and large bowel with some dilated small bowel loops noted. There is no evidence of free intraperitoneal gas. No abnormal calcifications. Cardiomegaly with unchanged pacemaker/AICD leads. There are bibasilar opacities, left greater than right. No large pleural effusion. No visible pneumothorax. There is no acute osseous abnormality. Degenerative changes of the spine. 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 IMPRESSION: Diffuse gaseous distension of small and large bowel with some dilated small bowel loops. Findings are compatible with ileus. Bibasilar airspace opacities, could be atelectasis or infection. Electronically Signed   By: JMaurine SimmeringM.D.   On: 05/08/2022 13:15    Microbiology: Results for orders placed or performed during the hospital encounter of 05/08/22  Aerobic/Anaerobic Culture w Gram Stain (surgical/deep wound)     Status: None   Collection Time: 05/09/22  5:38 PM   Specimen: Abscess  Result Value Ref Range Status   Specimen Description ABSCESS  Final   Special Requests NONE  Final   Gram Stain   Final    ABUNDANT WBC PRESENT,BOTH PMN AND MONONUCLEAR FEW GRAM POSITIVE COCCI IN CLUSTERS FEW GRAM POSITIVE COCCI IN CHAINS RARE GRAM NEGATIVE RODS    Culture   Final    ABUNDANT STREPTOCOCCUS ANGINOSIS MODERATE ESCHERICHIA COLI ABUNDANT BACTEROIDES FRAGILIS BETA LACTAMASE POSITIVE Performed at [facility], [place] [address] [place], [place] [address]    Report Status 05/13/2022 FINAL  Final   Organism ID, Bacteria ESCHERICHIA COLI  Final   Organism ID, Bacteria STREPTOCOCCUS  ANGINOSIS  Final      Susceptibility   Escherichia coli - MIC*    AMPICILLIN 8 SENSITIVE Sensitive     CEFAZOLIN <=4 SENSITIVE Sensitive     CEFEPIME <=0.12 SENSITIVE Sensitive     CEFTAZIDIME <=1 SENSITIVE Sensitive     CEFTRIAXONE <=0.25 SENSITIVE Sensitive     CIPROFLOXACIN <=0.25 SENSITIVE Sensitive     GENTAMICIN <=1 SENSITIVE Sensitive     IMIPENEM <=0.25 SENSITIVE Sensitive     TRIMETH/SULFA <=20 SENSITIVE Sensitive     AMPICILLIN/SULBACTAM <=2 SENSITIVE Sensitive     PIP/TAZO <=4 SENSITIVE Sensitive     * MODERATE ESCHERICHIA COLI   Streptococcus anginosis - MIC*    PENICILLIN <=0.06 SENSITIVE Sensitive     CEFTRIAXONE <=0.12 SENSITIVE Sensitive     ERYTHROMYCIN 4 RESISTANT Resistant     LEVOFLOXACIN 0.5 SENSITIVE Sensitive     VANCOMYCIN 0.5 SENSITIVE Sensitive     * ABUNDANT STREPTOCOCCUS ANGINOSIS    Labs: CBC: Recent Labs  Lab 05/17/22 0708 05/18/22 0317 05/19/22 0554 05/21/22 0555 05/22/22 1123  WBC 14.7* 12.0* 11.4* 10.2 11.1*  HGB 9.1* 9.5* 9.1* 10.8* 10.7*  HCT 30.6* 31.9* 30.2* 36.4* 35.5*  MCV 83.6 82.6 83.2 84.8 84.7  PLT 308 287 286 255 071   Basic Metabolic Panel: Recent Labs  Lab 05/18/22 0317 05/19/22 0554 05/20/22 0415 05/21/22 0555 05/22/22 0221 05/22/22 1123  NA 135 135 136 135  --  136  K 4.0 3.9 3.7 4.4  --  3.9  CL 100 104 104 101  --  101  CO2 '29 28 28 25  '$ --  28  GLUCOSE 104* 91 94 102*  --  92  BUN 7* 7* 8 12  --  11  CREATININE 0.63 0.69 0.75 0.89  --  0.75  CALCIUM 7.8* 7.7* 7.7* 7.8*  --  7.6*  MG 2.1 2.1 2.1 2.2 2.1  --    Liver Function Tests: No results for input(s): "AST", "ALT", "ALKPHOS", "BILITOT", "PROT", "ALBUMIN" in the last 168 hours. CBG: Recent Labs  Lab 05/21/22 1209 05/21/22 1620 05/21/22 2151 05/22/22 0732 05/22/22 1152  GLUCAP 125* 102* 96 76 83    Discharge time spent: greater than 30 minutes.  Signed: Patrecia Pour, MD [facility] 05/22/2022

## 2022-05-22 NOTE — Progress Notes (Signed)
Patient admitted to 79w07. A&Ox4 pain 3/10 oriented to room and unit policies

## 2022-05-22 NOTE — TOC Transition Note (Signed)
Transition of Care [facility]) - CM/SW Discharge Note   Patient Details  Name: Gregory [name] MRN: [id number] Date of Birth: 1961-04-15  [facility] CM/SW Contact:  Bethena Roys, RN Phone Number: 05/22/2022, 12:56 PM   Clinical Narrative:  Case Manager received notification via secure chat that the patient has a bed available on CIR. Plan for transition to CIR today. Case Manager did alert [facility] Liaison that the patient would transition to CIR today. No further needs identified at this time.    Final next level of care: IP Rehab Facility Barriers to Discharge: No Barriers Identified   Patient Goals and CMS Choice Patient states their goals for this hospitalization and ongoing recovery are:: pt wants to return home.   Choice offered to / list presented to : NA  Discharge Plan and Services In-house Referral: NA Discharge Planning Services: CM Consult Post Acute Care Choice: NA          DME Arranged: N/A DME Agency: NA       HH Arranged: PT HH Agency: [place] ([place]) Date [place]: 05/13/22 Time [place]: 1434 Representative spoke with at [place]: Caryl Pina   Readmission Risk Interventions    04/22/2022   12:49 PM  Readmission Risk Prevention Plan  Transportation Screening Complete  PCP or Specialist Appt within 3-5 Days Complete  HRI or [place] Complete  Social Work Consult for [place] Planning/Counseling Complete  Palliative Care Screening Not Applicable  Medication Review Press photographer) Complete

## 2022-05-22 NOTE — Progress Notes (Signed)
Inpatient Rehab Admissions Coordinator:   Following for my colleague, Danne Baxter.  We have a bed for Mr. Santistevan to admit to CIR today.  Dr. Bonner Puna in agreement and [facility] aware.  Will let pt/family know.   Shann Medal, PT, DPT Admissions Coordinator [phone] 05/22/22  10:09 AM

## 2022-05-22 NOTE — Progress Notes (Signed)
Telemetry is reviewed. SR, no VT. OK to discharge from EP perspective when otherwise ready medically. Please discharge on Mexiletine '150mg'$  TID Amiodarone '400mg'$  BID, early EP follow up will be arranged to start titration down. And home cardiac medications otherwise if able medically  We will sign off though remain available, please call if needed.  Tommye Standard, PA-C

## 2022-05-22 NOTE — Plan of Care (Signed)
  Problem: Education: Goal: Knowledge of General Education information will improve Description: Including pain rating scale, medication(s)/side effects and non-pharmacologic comfort measures Outcome: Progressing   Problem: Health Behavior/Discharge Planning: Goal: Ability to manage health-related needs will improve Outcome: Progressing   Problem: Clinical Measurements: Goal: Diagnostic test results will improve Outcome: Progressing   Problem: Clinical Measurements: Goal: Cardiovascular complication will be avoided Outcome: Progressing   Problem: Clinical Measurements: Goal: Respiratory complications will improve Outcome: Progressing   Problem: Activity: Goal: Risk for activity intolerance will decrease Outcome: Progressing   Problem: Pain Managment: Goal: General experience of comfort will improve Outcome: Progressing   Problem: Safety: Goal: Ability to remain free from injury will improve Outcome: Progressing   Problem: Skin Integrity: Goal: Risk for impaired skin integrity will decrease Outcome: Progressing

## 2022-05-22 NOTE — H&P (Signed)
Physical Medicine and Rehabilitation Admission H&P    CC: Debility secondary to sepsis, intra-abdominal abscess and recent colon surgery  HPI: Gregory [name] is a 61 year old male with recent diagnosis of colon mass status post right colon resection on Apr 18, 2022.  His history is also significant for ventricular tachycardia status post AICD in 2006.  He was brought to the emergency department on 5/25 via EMS after experiencing palpitations and shock discharge from his ICD.  Electrophysiology consultation was obtained and he was placed on amiodarone infusion.  CT scan of the abdomen pelvis performed and showed a very large intra-abdominal abscess as well as pseudoaneurysm of the splenic artery.  General surgery was consulted who discussed findings with interventional radiology.  Broad-spectrum antibiotics started.  CT angiogram was performed the following morning.  He underwent percutaneous drainage of the abscess and embolization of the splenic artery pseudoaneurysm on 5/26 by Dr. Dwaine Gale.  Infectious disease consultation was obtained.  Cultures grew E. coli and strep anginosus sensitive to Unasyn.  He continued to have intermittent runs of ventricular tachycardia and electrophysiology continued monitoring.  He has a history of coronary artery disease status post PCI/stent with no anginal symptoms.  Follow-up abdominal CT revealed persistent intra-abdominal abscess and on 5/31, he underwent catheter exchange and repositioning by IR.    During his previous hospitalization, he was found to have left upper extremity DVT in the left basilic vein on 5/8.  Due to upper extremity edema a repeat duplex study was performed on 6/1 with findings of left brachial vein. thrombosis.  Heparin infusion initiated. Transitioned to Eliquis.  He was transitioned to oral amiodarone on 6/2, however he developed recurrent VT and was placed back on amiodarone intravenously.  Mexiletine was initiated.  Able to transition back  to amiodarone 400 mg p.o. twice daily on 6/6.  Infectious disease follow-up on 6/7 recommend switching to amoxicillin/clavulanate 875 mg twice daily for 3-4 additional weeks and recommends repeat CT of the abdomen around 6/12.  Percutaneous drain continues.The patient requires inpatient medicine and rehabilitation evaluations and services for ongoing dysfunction secondary to sepsis, intra-abdominal abscess and recent colon surgery.   Pt reports feels like JP drain is stopped up- having low back pain but somewhat better- thinks pulled muscle rolling in bed.  Has also been in bedside chair for 3 hours- which is most since been here.  LBM yesterday- not formed Voiding OK- using urinal   Review of Systems  Constitutional:  Negative for chills and fever.  HENT:  Negative for hearing loss and sore throat.   Eyes:  Negative for blurred vision and pain.  Respiratory:  Negative for cough and shortness of breath.   Cardiovascular:  Negative for chest pain and palpitations.  Gastrointestinal:  Negative for nausea and vomiting.  Genitourinary:  Positive for urgency. Negative for dysuria.  Skin:        Small area of superficial breakdown left sacral area  Neurological:  Negative for dizziness and headaches.  All other systems reviewed and are negative.  Past Medical History:  Diagnosis Date   AICD (automatic cardioverter/defibrillator) present 2005   CAD (coronary artery disease) 12/01/2013   Chronic combined systolic and diastolic CHF, NYHA class 1 ([place]) 12/01/2013   Erectile dysfunction 12/01/2013   HTN (hypertension) 12/01/2013   Hyperlipidemia 12/01/2013   Ischemic cardiomyopathy 12/01/2013   Presence of permanent cardiac pacemaker    Sleep apnea    Past Surgical History:  Procedure Laterality Date   ACHILLES TENDON REPAIR  lft foot   BIOPSY  04/16/2022   Procedure: BIOPSY;  Surgeon: Yetta Flock, MD;  Location: [facility] ENDOSCOPY;  Service: Gastroenterology;;   CARDIAC  CATHETERIZATION N/A 05/05/2016   Procedure: Left Heart Cath and Coronary Angiography;  Surgeon: Leonie Man, MD;  Location: [place] [facility];  Service: Cardiovascular;  Laterality: N/A;   CARDIAC DEFIBRILLATOR PLACEMENT     CARDIOVERSION N/A 09/13/2020   Procedure: CARDIOVERSION;  Surgeon: Werner Lean, MD;  Location: [place] ENDOSCOPY;  Service: Cardiovascular;  Laterality: N/A;   CARDIOVERSION N/A 09/19/2020   Procedure: CARDIOVERSION;  Surgeon: [name], MD;  Location: [facility] ENDOSCOPY;  Service: Cardiovascular;  Laterality: N/A;   COLON RESECTION N/A 04/18/2022   Procedure: HAND ASSISTED LAPAROSCOPIC RIGHT COLON RESECTION;  Surgeon: Felicie Morn, MD;  Location: [place];  Service: General;  Laterality: N/A;   COLONOSCOPY WITH PROPOFOL N/A 04/16/2022   Procedure: COLONOSCOPY WITH PROPOFOL;  Surgeon: Yetta Flock, MD;  Location: [place];  Service: Gastroenterology;  Laterality: N/A;   EP IMPLANTABLE DEVICE N/A 12/19/2016   Procedure: ICD Generator Changeout;  Surgeon: [name], MD;  Location: [place] [facility];  Service: Cardiovascular;  Laterality: N/A;   ESOPHAGOGASTRODUODENOSCOPY N/A 04/16/2022   Procedure: ESOPHAGOGASTRODUODENOSCOPY (EGD);  Surgeon: Yetta Flock, MD;  Location: [facility] ENDOSCOPY;  Service: Gastroenterology;  Laterality: N/A;   IR ANGIOGRAM SELECTIVE EACH ADDITIONAL VESSEL  05/09/2022   IR ANGIOGRAM VISCERAL SELECTIVE  05/09/2022   IR CATHETER TUBE CHANGE  05/14/2022   IR EMBO ART  VEN HEMORR LYMPH EXTRAV  INC GUIDE ROADMAPPING  05/09/2022   IR IMAGE GUIDED DRAINAGE PERCUT CATH  PERITONEAL RETROPERIT  05/09/2022   IR US GUIDE VASC ACCESS RIGHT  05/09/2022   LEFT HEART CATH AND CORONARY ANGIOGRAPHY N/A 04/17/2022   Procedure: LEFT HEART CATH AND CORONARY ANGIOGRAPHY;  Surgeon: Belva Crome, MD;  Location: [place] [facility];  Service: Cardiovascular;  Laterality: N/A;   POLYPECTOMY  04/16/2022   Procedure: POLYPECTOMY;  Surgeon: Yetta Flock, MD;  Location: [facility] ENDOSCOPY;  Service: Gastroenterology;;   RIGHT/LEFT HEART CATH AND CORONARY ANGIOGRAPHY N/A 09/24/2020   Procedure: RIGHT/LEFT HEART CATH AND CORONARY ANGIOGRAPHY;  Surgeon: Lorretta Harp, MD;  Location: [place] [facility];  Service: Cardiovascular;  Laterality: N/A;   SUBMUCOSAL TATTOO INJECTION  04/16/2022   Procedure: SUBMUCOSAL TATTOO INJECTION;  Surgeon: Yetta Flock, MD;  Location: [facility] ENDOSCOPY;  Service: Gastroenterology;;   WRIST TENODESIS     Family History  Problem Relation Age of Onset   Heart disease Mother    Brain cancer Father    Hypertension Daughter    Social History:  reports that he quit smoking about 8 years ago. His smoking use included cigarettes. He has a 30.00 pack-year smoking history. He has never used smokeless tobacco. He reports that he does not currently use alcohol. He reports that he does not use drugs. Allergies: No Known Allergies Medications Prior to Admission  Medication Sig Dispense Refill   acetaminophen (TYLENOL) 500 MG tablet Take 1,000 mg by mouth every 6 (six) hours as needed for moderate pain.     amiodarone (PACERONE) 200 MG tablet Take 2 tablets (400 mg total) by mouth daily. 60 tablet 0   apixaban (ELIQUIS) 5 MG TABS tablet TAKE 1 TABLET(5 MG) BY MOUTH TWICE DAILY (Patient taking differently: Take 5 mg by mouth 2 (two) times daily.) 180 tablet 1   atorvastatin (LIPITOR) 80 MG tablet TAKE ONE TABLET BY MOUTH  EVERY EVENING (Patient taking differently: Take 80 mg by mouth every evening.) 90 tablet 3   clopidogrel (PLAVIX) 75 MG tablet TAKE 1 TABLET(75 MG) BY MOUTH DAILY (Patient taking differently: Take 75 mg by mouth daily. TAKE 1 TABLET(75 MG) BY MOUTH DAILY) 90 tablet 2   dapagliflozin propanediol (FARXIGA) 10 MG TABS tablet Take 1 tablet (10 mg total) by mouth daily. 90 tablet 3   ezetimibe (ZETIA) 10 MG tablet Take 1 tablet (10 mg total) by mouth daily. 90 tablet 3   furosemide (LASIX) 20 MG tablet Take 2 tablets  (40 mg total) by mouth 3 (three) times a week. (Patient taking differently: Take 40 mg by mouth every Monday, Wednesday, and Friday.) 80 tablet 1   levETIRAcetam (KEPPRA) 500 MG tablet TAKE 1 TABLET(500 MG) BY MOUTH TWICE DAILY (Patient taking differently: Take 500 mg by mouth 2 (two) times daily.) 60 tablet 3   metoprolol succinate (TOPROL-XL) 25 MG 24 hr tablet Take one tablet, 25 mg, in the morning and 50 mg, two tablets, in the evening. (Patient taking differently: 25 mg. Per patient taking 25 mg in the morning and 25 mg at night) 135 tablet 3   oxyCODONE (OXY IR/ROXICODONE) 5 MG immediate release tablet Take 1-2 tablets (5-10 mg total) by mouth every 8 (eight) hours as needed for breakthrough pain. 15 tablet 0   potassium chloride (KLOR-CON M) 10 MEQ tablet Take 1 tablet (10 mEq total) by mouth daily. 30 tablet 6   sacubitril-valsartan (ENTRESTO) 49-51 MG Take 1 tablet by mouth 2 (two) times daily. 180 tablet 1   pantoprazole (PROTONIX) 40 MG tablet TAKE 1 TABLET(40 MG) BY MOUTH DAILY (Patient taking differently: Take 40 mg by mouth daily. TAKE 1 TABLET(40 MG) BY MOUTH DAILY) 30 tablet 2   sildenafil (VIAGRA) 50 MG tablet Take 1 tablet (50 mg total) by mouth as needed for erectile dysfunction. 30 tablet 4      Home: Home Living Family/patient expects to be discharged to:: Private residence Living Arrangements:  (adult son lives with him) Available Help at Discharge:  (son works from home) Type of Home: House Home Access: Level entry Home Layout: Two level, Bed/bath upstairs, 1/2 bath on main level Alternate Level Stairs-Number of Steps: 14 Alternate Level Stairs-Rails: Right, Left, Can reach both Bathroom Shower/Tub: Chiropodist: Standard Bathroom Accessibility: Yes Home Equipment: Shower seat, Hand held shower head Additional Comments: pt states not needing DME, son assists with driving and errands  Lives With: Son   Functional History: Prior Function Prior  Level of Function : Independent/Modified Independent Mobility Comments: pt states independent without DME, prior to issues starting had been going to the gym daily for cardio and strength training. ADLs Comments: pt reports independence, family assists with IADLs  Functional Status:  Mobility: Bed Mobility Overal bed mobility: Needs Assistance Bed Mobility: Rolling, Sit to Sidelying Rolling: Supervision Sidelying to sit: Min assist, HOB elevated Supine to sit: Min assist Sit to supine: Min assist Sit to sidelying: Min assist General bed mobility comments: required assistance with BLEs to get back to bed Transfers Overall transfer level: Needs assistance Equipment used: Rolling walker (2 wheels) Transfers: Sit to/from Stand, Bed to chair/wheelchair/BSC Sit to Stand: Min guard, Min assist Bed to/from chair/wheelchair/BSC transfer type:: Stand pivot Stand pivot transfers: Min guard General transfer comment: patient min assist to stand from recliner and min guard to min assist to stand from [facility] and EOB Ambulation/Gait Ambulation/Gait assistance: Min assist Gait Distance (Feet): 30 Feet Assistive  device: Rolling walker (2 wheels) Gait Pattern/deviations: Step-through pattern, Decreased stride length, Trunk flexed General Gait Details: cues for posture and proximity to RW. Pt walked 30' x 2 then additional 15' all with seated rest between trials Gait velocity: decreased Gait velocity interpretation: 1.31 - 2.62 ft/sec, indicative of limited community ambulator Pre-gait activities: sidesteps x3 to his R side using RW, shuffled    ADL: ADL Overall ADL's : Needs assistance/impaired Eating/Feeding: Minimal assistance Eating/Feeding Details (indicate cue type and reason): assistance to open containers Grooming: Wash/dry hands, Wash/dry face, Minimal assistance, Standing Grooming Details (indicate cue type and reason): stood at sink Upper Body Bathing: Set up, Sitting Lower Body  Bathing: Minimal assistance, Sit to/from stand Upper Body Dressing : Set up, Sitting Lower Body Dressing: Minimal assistance, Sit to/from stand Lower Body Dressing Details (indicate cue type and reason): able to bring feet to self for sock mgmt with increased effort. reports typically propping foot up on bedframe at home and bedrail here. discussed easier clothing to manage and techniques to minimize pain Toilet Transfer: Minimal assistance, BSC/3in1 Toilet Transfer Details (indicate cue type and reason): transfer from EOB to [facility] with RW and min assist Toileting- Clothing Manipulation and Hygiene: Minimal assistance, Sit to/from stand Toileting - Clothing Manipulation Details (indicate cue type and reason): required min assist to complete General ADL Comments: mi nassist to stand from lower surfaces.  Cognition: Cognition Overall Cognitive Status: Within Functional Limits for tasks assessed Orientation Level: Oriented X4 Cognition Arousal/Alertness: Awake/alert Behavior During Therapy: WFL for tasks assessed/performed Overall Cognitive Status: Within Functional Limits for tasks assessed General Comments: anxious over pain and heart palpatations  Physical Exam: Blood pressure (!) 147/103, pulse 76, temperature 97.8 F (36.6 C), temperature source Oral, resp. rate 19, height '6\' 6"'$  (1.981 m), weight 102.1 kg, SpO2 95 %. Physical Exam Vitals and nursing note reviewed. Exam conducted with a chaperone present.  Constitutional:      General: He is not in acute distress.    Comments: 6 ft 6 inches male sitting up in bedside chair- took 2 people to get him up to get into bed at end of interview; alert, appropriate, NAD  HENT:     Head: Normocephalic and atraumatic.     Comments: L eye chronic fatty deposit/mass on lateral aspect of L eye- not interfering with vision EOMI B/L     Right Ear: External ear normal.     Left Ear: External ear normal.     Nose: Nose normal. No congestion.      Mouth/Throat:     Mouth: Mucous membranes are moist.     Pharynx: Oropharynx is clear. No oropharyngeal exudate.  Eyes:     General:        Right eye: No discharge.        Left eye: No discharge.     Extraocular Movements: Extraocular movements intact.     Comments: Left sclera pterygium  Cardiovascular:     Rate and Rhythm: Normal rate and regular rhythm.     Heart sounds: Normal heart sounds. No murmur heard.    No gallop.  Pulmonary:     Effort: Pulmonary effort is normal. No respiratory distress.     Breath sounds: Normal breath sounds. No wheezing, rhonchi or rales.  Abdominal:     Comments: Hypoactive BS; ?distension vs protuberance NT; bandage on R side of abd with JP drain draining purulent drainage- 2-5cc in bulb  Musculoskeletal:     Cervical back: Neck supple. No tenderness.  Right lower leg: Edema present.     Left lower leg: Edema present.     Comments: L arm forearm to mid upper arm nonpitting edema 2-3+ B/L LE's 3+ pitting edema to knees B/L- no TEDs and hanging down 5-/5 throughout except LLE 4+/5 (pt felt it feels heavier)  Skin:    General: Skin is warm and dry.     Comments: Small area of superficial breakdown left sacral area Skin tear of R buttock- nickel sized R hand IV site- appears was infiltrated- bump in skin R forearm reddened- appears cellulitic- from previous IV JP drain as already discussed B/L ear piercings  Neurological:     General: No focal deficit present.     Mental Status: He is oriented to person, place, and time.     Comments: Intact to light touch in all 4 extremities  Psychiatric:        Mood and Affect: Mood normal.        Behavior: Behavior normal.     Results for orders placed or performed during the hospital encounter of 05/08/22 (from the past 48 hour(s))  Glucose, capillary     Status: None   Collection Time: 05/20/22 11:47 AM  Result Value Ref Range   Glucose-Capillary 80 70 - 99 mg/dL    Comment: Glucose reference  range applies only to samples taken after fasting for at least 8 hours.  Glucose, capillary     Status: Abnormal   Collection Time: 05/20/22  4:50 PM  Result Value Ref Range   Glucose-Capillary 112 (H) 70 - 99 mg/dL    Comment: Glucose reference range applies only to samples taken after fasting for at least 8 hours.  Glucose, capillary     Status: Abnormal   Collection Time: 05/20/22  9:23 PM  Result Value Ref Range   Glucose-Capillary 120 (H) 70 - 99 mg/dL    Comment: Glucose reference range applies only to samples taken after fasting for at least 8 hours.  Magnesium     Status: None   Collection Time: 05/21/22  5:55 AM  Result Value Ref Range   Magnesium 2.2 1.7 - 2.4 mg/dL    Comment: Performed at [facility] [address], [place], [place] [address]  Basic metabolic panel     Status: Abnormal   Collection Time: 05/21/22  5:55 AM  Result Value Ref Range   Sodium 135 135 - 145 mmol/L   Potassium 4.4 3.5 - 5.1 mmol/L   Chloride 101 98 - 111 mmol/L   CO2 25 22 - 32 mmol/L   Glucose, Bld 102 (H) 70 - 99 mg/dL    Comment: Glucose reference range applies only to samples taken after fasting for at least 8 hours.   BUN 12 8 - 23 mg/dL   Creatinine, Ser 0.89 0.61 - 1.24 mg/dL   Calcium 7.8 (L) 8.9 - 10.3 mg/dL   GFR, Estimated >60 >60 mL/min    Comment: (NOTE) Calculated using the CKD-EPI Creatinine Equation (2021)    Anion gap 9 5 - 15    Comment: Performed at [facility] [address] [place] [address]  CBC     Status: Abnormal   Collection Time: 05/21/22  5:55 AM  Result Value Ref Range   WBC 10.2 4.0 - 10.5 K/uL   RBC 4.29 4.22 - 5.81 MIL/uL   Hemoglobin 10.8 (L) 13.0 - 17.0 g/dL   HCT 36.4 (L) 39.0 - 52.0 %   MCV  84.8 80.0 - 100.0 fL   MCH 25.2 (L) 26.0 - 34.0 pg   MCHC 29.7 (L) 30.0 - 36.0 g/dL   RDW 23.5 (H) 11.5 - 15.5 %   Platelets 255 150 - 400 K/uL   nRBC 0.0 0.0 - 0.2 %    Comment: Performed at [facility] [address],  [place], [place] [address]  Glucose, capillary     Status: None   Collection Time: 05/21/22  8:01 AM  Result Value Ref Range   Glucose-Capillary 94 70 - 99 mg/dL    Comment: Glucose reference range applies only to samples taken after fasting for at least 8 hours.  Glucose, capillary     Status: Abnormal   Collection Time: 05/21/22 12:09 PM  Result Value Ref Range   Glucose-Capillary 125 (H) 70 - 99 mg/dL    Comment: Glucose reference range applies only to samples taken after fasting for at least 8 hours.  Glucose, capillary     Status: Abnormal   Collection Time: 05/21/22  4:20 PM  Result Value Ref Range   Glucose-Capillary 102 (H) 70 - 99 mg/dL    Comment: Glucose reference range applies only to samples taken after fasting for at least 8 hours.  Glucose, capillary     Status: None   Collection Time: 05/21/22  9:51 PM  Result Value Ref Range   Glucose-Capillary 96 70 - 99 mg/dL    Comment: Glucose reference range applies only to samples taken after fasting for at least 8 hours.  Magnesium     Status: None   Collection Time: 05/22/22  2:21 AM  Result Value Ref Range   Magnesium 2.1 1.7 - 2.4 mg/dL    Comment: Performed at [facility] [address] [place], [place] [address]  Glucose, capillary     Status: None   Collection Time: 05/22/22  7:32 AM  Result Value Ref Range   Glucose-Capillary 76 70 - 99 mg/dL    Comment: Glucose reference range applies only to samples taken after fasting for at least 8 hours.   No results found.    Blood pressure (!) 147/103, pulse 76, temperature 97.8 F (36.6 C), temperature source Oral, resp. rate 19, height '6\' 6"'$  (1.981 m), weight 102.1 kg, SpO2 95 %.  Medical Problem List and Plan: 1. Functional deficits secondary to sepsis, intra-abdominal abscess status post percutaneous drain and recent colon surgery  -patient may  shower if covers JP drain site  -ELOS/Goals: 10-12 days mod I to intermittent supervision 2.   Antithrombotics: -DVT/anticoagulation:  Pharmaceutical: Other (comment) Eliquis 5 mg twice daily  -antiplatelet therapy: None 3. Pain Management: Tylenol, Percocet as needed 4. Mood: LCSW to evaluate and provide emotional support  -antipsychotic agents: n/a 5. Neuropsych: This patient is capable of making decisions on his own behalf. 6. Skin/Wound Care: Routine skin care checks  --routine drain care 7. Fluids/Electrolytes/Nutrition: Routine ins and outs and follow-up chemistries 8: Intraabdominal abscess s/p perc drain placement- pt knows might go home with drain  --plan Augmentin for 3-4 weeks per ID  --plan follow-up CT 6/12  --continue monitoring drain output; follow-up IR drain clinic 9: Colon cancer s/p right hemicolectomy 5/5 with mets to lymph nodes  --follow-up with Dr. Burr Medico; PET scan pending- outpatient? 10: CAD/prior PCI to LAD  --cath on 5/4-stable continue medical management  --continue Plavix and aspirin 11: Recurrent VT/prior ACID: continue mexiletine and amiodorone  --keep K+ > 4.0; Mg > 2.0  --follow-up with Dr.  Caryl Comes 12: Chronic sys and dia HF: continue [place]; Lasix (daily dosing)  --daily weight  --his home Delene Loll has not been restarted (call into cardiology)  --follow-up Dr. Sallyanne Kuster 13: Paroxysmal AF: continue Eliquis 14: Left brachial DVT: continue Eliquis 15: Hypothyroidism; continue Synthroid 16: Hypertension: continue to monitor 17: Seizure disorder: no recent seizure activity; continue Keppra 18: OSA: continue CPAP 19: DM: has been on regular diet without need for insulin coverage; A1c = 5.1  --d/c SSI 20: Hyperlipidemia: continue Zetia, Lipitor 21. Cellulitis? R forearm- already on Unasyn- should cover- will monitor   I have personally performed a face to face diagnostic evaluation of this patient and formulated the key components of the plan.  Additionally, I have personally reviewed laboratory data, imaging studies, as well as relevant notes and  concur with the physician assistant's documentation above.   The patient's status has not changed from the original H&P.  Any changes in documentation from the acute care chart have been noted above.     Barbie Banner, PA-C 05/22/2022

## 2022-05-22 NOTE — Progress Notes (Signed)
Occupational Therapy Treatment Patient Details Name: Gregory [name] MRN: [id number] DOB: [birth year] Today's Date: 05/22/2022   History of present illness 61 yo male admitted 5/25 after AICD firing x3 and pt experiencing SOB. 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 PMHx: CAD, VT, OSA on CPAP, seizures, CHF, HLD, HTN, obesity, afib, PVCs, and metastatic colon CA s/p colon resection 5/5.   OT comments  Patient motivated towards therapy and eager to get out of bed. Patient was min assist to get to EOB due to assistance with scooting forward and stood to RW with min guard from raised bed. Patient transferred to [facility] and was min assist with toilet hygiene standing. Patient able to ambulate in room to recliner but was fatigue after short distance. Patient is expected to discharge to AIR today and would benefit from further OT services.    Recommendations for follow up therapy are one component of a multi-disciplinary discharge planning process, led by the attending physician.  Recommendations may be updated based on patient status, additional functional criteria and insurance authorization.    Follow Up Recommendations  Acute inpatient rehab (3hours/day)    Assistance Recommended at Discharge Intermittent Supervision/Assistance  Patient can return home with the following  A little help with bathing/dressing/bathroom;Help with stairs or ramp for entrance;Assistance with cooking/housework;Assist for transportation   Equipment Recommendations  Other (comment);BSC/3in1    Recommendations for Other Services      Precautions / Restrictions Precautions Precautions: Fall Precaution Comments: watch O2 and HR, jp drain Restrictions Weight Bearing Restrictions: No       Mobility Bed Mobility Overal bed mobility: Needs Assistance Bed Mobility: Rolling,  Sidelying to Sit Rolling: Supervision Sidelying to sit: Min assist, HOB elevated       General bed mobility comments: required assistance with scooting towards EOB    Transfers Overall transfer level: Needs assistance Equipment used: Rolling walker (2 wheels) Transfers: Sit to/from Stand, Bed to chair/wheelchair/BSC Sit to Stand: Min guard, Min assist           General transfer comment: min guard to stand fromraised bed and min assist to stand from [facility]     Balance Overall balance assessment: Needs assistance Sitting-balance support: No upper extremity supported, Feet supported Sitting balance-Leahy Scale: Good Sitting balance - Comments: sitting on EOB without assitance   Standing balance support: Bilateral upper extremity supported, No upper extremity supported, During functional activity Standing balance-Leahy Scale: Poor Standing balance comment: able to assist with toilet hygiene                           ADL either performed or assessed with clinical judgement   ADL Overall ADL's : Needs assistance/impaired Eating/Feeding: Minimal assistance Eating/Feeding Details (indicate cue type and reason): assistance to open containers                     Toilet Transfer: Minimal assistance;BSC/3in1 Toilet Transfer Details (indicate cue type and reason): transfer from EOB to [facility] with RW and min assist Toileting- Clothing Manipulation and Hygiene: Minimal assistance;Sit to/from stand Toileting - Clothing Manipulation Details (indicate cue type and reason): required min assist to complete       General ADL Comments: min assist to stand from [facility]    Extremity/Trunk Assessment  Vision       Perception     Praxis      Cognition Arousal/Alertness: Awake/alert Behavior During Therapy: WFL for tasks assessed/performed Overall Cognitive Status: Within Functional Limits for tasks assessed                                           Exercises      Shoulder Instructions       General Comments      Pertinent Vitals/ Pain       Pain Assessment Pain Assessment: Faces Faces Pain Scale: Hurts a little bit Pain Location: abdomen Pain Descriptors / Indicators: Aching, Guarding Pain Intervention(s): Limited activity within patient's tolerance  Home Living                                          Prior Functioning/Environment              Frequency  Min 2X/week        Progress Toward Goals  OT Goals(current goals can now be found in the care plan section)  Progress towards OT goals: Progressing toward goals  Acute Rehab OT Goals Patient Stated Goal: go to rehab OT Goal Formulation: With patient Time For Goal Achievement: 05/28/22 Potential to Achieve Goals: Good ADL Goals Pt Will Perform Lower Body Bathing: with modified independence;sit to/from stand;sitting/lateral leans Pt Will Perform Lower Body Dressing: with modified independence;sit to/from stand Pt Will Transfer to Toilet: with modified independence;ambulating Pt/caregiver will Perform Home Exercise Program: Increased strength;Both right and left upper extremity;With theraband;Independently;With written HEP provided Additional ADL Goal #1: Pt to increase standing tolerance > 10 min during ADLs/mobility to improve overall endurance  Plan Discharge plan remains appropriate    Co-evaluation                 AM-PAC OT "6 Clicks" Daily Activity     Outcome Measure   Help from another person eating meals?: None Help from another person taking care of personal grooming?: A Little Help from another person toileting, which includes using toliet, bedpan, or urinal?: A Little Help from another person bathing (including washing, rinsing, drying)?: A Little Help from another person to put on and taking off regular upper body clothing?: A Little Help from another person to put on and taking off regular lower body  clothing?: A Little 6 Click Score: 19    End of Session Equipment Utilized During Treatment: Rolling walker (2 wheels);Gait belt  OT Visit Diagnosis: Other abnormalities of gait and mobility (R26.89);Muscle weakness (generalized) (M62.81)   Activity Tolerance Patient tolerated treatment well   Patient Left in chair;with call bell/phone within reach;with chair alarm set   Nurse Communication Mobility status        Time: 8127-5170 OT Time Calculation (min): 23 min  Charges: OT General Charges $OT Visit: 1 Visit OT Treatments $Self Care/Home Management : 23-37 mins  Lodema Hong, [place]  Pager [phone] Office [place] 05/22/2022, 2:14 PM

## 2022-05-22 NOTE — Progress Notes (Signed)
PMR Admission Coordinator Pre-Admission Assessment   Patient: Gregory [name] is an 62 y.o., male MRN: [id number] DOB: Jan 25, 1961 Height: _0  (198.1 cm).  Weight: 102.1 kg   Insurance Information HMO:     PPO:      PCP:      IPA:      80/20:      OTHER:  PRIMARY: Workers compensation      Policy#: claim # [id number]      Subscriber: pt CM Name: Jari Favre      Phone#: [phone]     Fax#: [phone] Pre-Cert#: claim # [id number] approved for 7 days ONLY  ( requested 2 weeks and allowed only 7 days) Would have to appeal for longer     Employer: [place] [place]; date of injury 05/29/2004 Benefits:  Phone #: [employer]     Name: 6/6 Eff. Date: 05/29/2004     Deduct:       Out of Pocket Max:       Life Max:  CIR: per workers compensation guidelines      SNF: per [facility] Outpatient: per WC     Co-Pay:  Home Health: per [facility]      Co-Pay:  DME: per WC     Co-Pay:  Providers: in network with WC  SECONDARY: [employer]      Policy#: [id number]   Financial Counselor:       Phone#:    The "Data Collection Information Summary" for patients in Inpatient Rehabilitation Facilities with attached "Privacy Act [place] Records" was provided and verbally reviewed with: N/A   Emergency Contact Information Contact Information       Name Relation Home Work Mobile    [place] Daughter     [phone]    DAM, ASHRAF     [phone]    [facility] Mother [phone]        Jacobowitz,[name] Brother [phone]        Naod, [place] Daughter     [phone]    Helmut Muster     [phone]         Current Medical History  Patient Admitting Diagnosis: Debility, ventricular tachycardia   History of Present Illness: 61  year old male with history orf CAD s/p stenting, Chronic systolic Heart failure, recurrent VT with ICD placement, atrial fibrillation, type 2 DM, HTN, seizures who presented on 05/08/22 to ED after his ICD gave him a  shock..He has had recent surgery for partially obstructing colonic mass  for colon cancer , colon adenocarcinoma, moderately to poorly differentiated . Metastatic involving 11 of 35 lymph nodes. During previous admit had cardiac cath on 5/4 and s/p lap right colectomy 5/5. Multiple blood transfusions.    Cardiology/EP consulted and felt VT storm felt likely provoked by acute abdominal process. Placed on amio IV and Mexiletine. Has some leg edema and began diuresis with lasix. Continue Farxiga. Held Metoprolol and Entresto due to hypotension.  Well controlled type 2 DM. Continue Keppra for seizures. CPAP at HS.      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 Held Eliquis/plavix and underwent percutaneous drain and embolization of the splenic artery. Placed on Unasyn with plans to transition to Augmentin at discharge per ID consultation. Venous doppler revealed DVT of the left brachial vein. Transitioned from IV heparin back to Eliquis.    Plan for oncology follow up outpatient for PET scan and to discuss treatment options.  Patient's medical record from [facility] has been reviewed by the rehabilitation admission coordinator and physician.   Past Medical History      Past Medical History:  Diagnosis Date   AICD (automatic cardioverter/defibrillator) present 2005   CAD (coronary artery disease) 12/01/2013   Chronic combined systolic and diastolic CHF, NYHA class 1 ([place]) 12/01/2013   Erectile dysfunction 12/01/2013   HTN (hypertension) 12/01/2013   Hyperlipidemia 12/01/2013   Ischemic cardiomyopathy 12/01/2013   Presence of permanent cardiac pacemaker     Sleep apnea      Has the patient had major surgery during 100 days prior to admission? Yes   Family History   family history includes Brain cancer in his father; Heart disease in his mother; Hypertension in his daughter.   Current Medications    Current Facility-Administered Medications:    0.9 %  sodium chloride infusion, 250 mL, Intravenous, PRN, Orma Flaming, MD, Last Rate: 10 mL/hr at 05/19/22 2041, 250 mL at 05/19/22 2041   acetaminophen (TYLENOL) tablet 650 mg, 650 mg, Oral, Q6H PRN, 650 mg at 05/21/22 1146 **OR** acetaminophen (TYLENOL) suppository 650 mg, 650 mg, Rectal, Q6H PRN, Orma Flaming, MD   amiodarone (PACERONE) tablet 400 mg, 400 mg, Oral, BID, Baldwin Jamaica, PA-C, 400 mg at 05/21/22 2049   Ampicillin-Sulbactam (UNASYN) 3 g in sodium chloride 0.9 % 100 mL IVPB, 3 g, Intravenous, Q6H, Shawna Clamp, MD, Last Rate: 200 mL/hr at 05/22/22 0611, 3 g at 05/22/22 4431   apixaban (ELIQUIS) tablet 5 mg, 5 mg, Oral, BID, [name], MD, 5 mg at 05/21/22 2050   atorvastatin (LIPITOR) tablet 80 mg, 80 mg, Oral, QPM, Orma Flaming, MD, 80 mg at 05/21/22 1724   bisacodyl (DULCOLAX) suppository 10 mg, 10 mg, Rectal, Daily PRN, Meuth, Brooke A, PA-C   cyclobenzaprine (FLEXERIL) tablet 7.5 mg, 7.5 mg, Oral, TID PRN, Vance Gather B, MD, 7.5 mg at 05/22/22 0457   dapagliflozin propanediol (FARXIGA) tablet 10 mg, 10 mg, Oral, Daily, Orma Flaming, MD, 10 mg at 05/21/22 5400   diphenhydrAMINE-zinc acetate (BENADRYL) 2-0.1 % cream, , Topical, TID PRN, Meuth, Brooke A, PA-C   docusate sodium (COLACE) capsule 100 mg, 100 mg, Oral, BID, Rolm Bookbinder, MD, 100 mg at 05/21/22 2049   ezetimibe (ZETIA) tablet 10 mg, 10 mg, Oral, Daily, Orma Flaming, MD, 10 mg at 05/21/22 8676   feeding supplement (ENSURE ENLIVE / ENSURE PLUS) liquid 237 mL, 237 mL, Oral, BID BM, Meuth, Brooke A, PA-C, 237 mL at 05/21/22 1354   furosemide (LASIX) tablet 40 mg, 40 mg, Oral, BID, [name], MD, 40 mg at 05/22/22 0816   insulin aspart (novoLOG) injection 0-9 Units, 0-9 Units, Subcutaneous, TID WC, Orma Flaming, MD, 1 Units at 05/20/22 0758   levETIRAcetam (KEPPRA) tablet 500 mg, 500 mg, Oral, BID, Orma Flaming, MD, 500 mg at 05/21/22 2050    mexiletine (MEXITIL) capsule 150 mg, 150 mg, Oral, Q8H, Vickie Epley, MD, 150 mg at 05/22/22 0606   oxyCODONE-acetaminophen (PERCOCET/ROXICET) 5-325 MG per tablet 1-2 tablet, 1-2 tablet, Oral, Q4H PRN, Little Ishikawa, MD, 2 tablet at 05/22/22 0410   pantoprazole (PROTONIX) EC tablet 40 mg, 40 mg, Oral, BID, Shawna Clamp, MD, 40 mg at 05/21/22 2048   polyethylene glycol (MIRALAX / GLYCOLAX) packet 17 g, 17 g, Oral, BID, Meuth, Brooke A, PA-C, 17 g at 05/18/22 0912   sodium chloride flush (NS) 0.9 % injection 3 mL, 3 mL, Intravenous, Q12H, Orma Flaming, MD, 3 mL  at 05/21/22 2200   sodium chloride flush (NS) 0.9 % injection 3 mL, 3 mL, Intravenous, PRN, Orma Flaming, MD, 3 mL at 05/11/22 0155   sodium chloride flush (NS) 0.9 % injection 5 mL, 5 mL, Intracatheter, Q8H, Suttle, Dylan J, MD, 5 mL at 05/22/22 0612   sodium phosphate (FLEET) 7-19 GM/118ML enema 1 enema, 1 enema, Rectal, Daily PRN, Kinsinger, Arta Bruce, MD   Patients Current Diet:  Diet Order                  Diet regular Room service appropriate? Yes; Fluid consistency: Thin  Diet effective now                       Precautions / Restrictions Precautions Precautions: Fall Precaution Comments: watch O2 and HR, jp drain Restrictions Weight Bearing Restrictions: No    Has the patient had 2 or more falls or a fall with injury in the past year? No   Prior Activity Level Community (5-7x/wk): reports independence   Prior Functional Level Self Care: Did the patient need help bathing, dressing, using the toilet or eating? Independent   Indoor Mobility: Did the patient need assistance with walking from room to room (with or without device)? Independent   Stairs: Did the patient need assistance with internal or external stairs (with or without device)? Independent   Functional Cognition: Did the patient need help planning regular tasks such as shopping or remembering to take medications? Independent    Patient Information Are you of Hispanic, Latino/a,or Spanish origin?: A. No, not of Hispanic, Latino/a, or Spanish origin What is your race?: B. Black or African American Do you need or want an interpreter to communicate with a doctor or health care staff?: 0. No   Patient's Response To:  Health Literacy and Transportation Is the patient able to respond to health literacy and transportation needs?: Yes Health Literacy - How often do you need to have someone help you when you read instructions, pamphlets, or other written material from your doctor or pharmacy?: Never In the past 12 months, has lack of transportation kept you from medical appointments or from getting medications?: No In the past 12 months, has lack of transportation kept you from meetings, work, or from getting things needed for daily living?: No   Development worker, international aid / [place] Devices/Equipment: CPAP Home Equipment: Shower seat, Hand held shower head   Prior Device Use: Indicate devices/aids used by the patient prior to current illness, exacerbation or injury? None of the above   Current Functional Level Cognition   Overall Cognitive Status: Within Functional Limits for tasks assessed Orientation Level: Oriented X4 General Comments: anxious over pain and heart palpatations    Extremity Assessment (includes Sensation/Coordination)   Upper Extremity Assessment: Overall WFL for tasks assessed (L forearm swollen likely due to IV)  Lower Extremity Assessment: Defer to PT evaluation     ADLs   Overall ADL's : Needs assistance/impaired Eating/Feeding: Independent Grooming: Wash/dry hands, Wash/dry face, Minimal assistance, Standing Grooming Details (indicate cue type and reason): stood at sink Upper Body Bathing: Set up, Sitting Lower Body Bathing: Minimal assistance, Sit to/from stand Upper Body Dressing : Set up, Sitting Lower Body Dressing: Minimal assistance, Sit to/from stand Lower Body  Dressing Details (indicate cue type and reason): able to bring feet to self for sock mgmt with increased effort. reports typically propping foot up on bedframe at home and bedrail here. discussed easier clothing to manage  and techniques to minimize pain Toilet Transfer: Minimal assistance, Ambulation, BSC/3in1 Toilet Transfer Details (indicate cue type and reason): ambulated to [facility] from recliner with min assist Toileting- Clothing Manipulation and Hygiene: Minimal assistance, Sit to/from stand Toileting - Clothing Manipulation Details (indicate cue type and reason): required min assist to complete General ADL Comments: increased tolerance to standing with patient able to stand at sink for hand and face hygiene     Mobility   Overal bed mobility: Needs Assistance Bed Mobility: Rolling, Sit to Sidelying Rolling: Supervision Sidelying to sit: Min assist, HOB elevated Supine to sit: Min assist Sit to supine: Min assist Sit to sidelying: Min assist General bed mobility comments: required assistance with BLEs to get back to bed     Transfers   Overall transfer level: Needs assistance Equipment used: Rolling walker (2 wheels) Transfers: Sit to/from Stand, Bed to chair/wheelchair/BSC Sit to Stand: Min guard, Min assist Bed to/from chair/wheelchair/BSC transfer type:: Stand pivot Stand pivot transfers: Min guard General transfer comment: patient min assist to stand from recliner and min guard to min assist to stand from [facility] and EOB     Ambulation / Gait / Stairs / Wheelchair Mobility   Ambulation/Gait Ambulation/Gait assistance: Herbalist (Feet): 30 Feet Assistive device: Rolling walker (2 wheels) Gait Pattern/deviations: Step-through pattern, Decreased stride length, Trunk flexed General Gait Details: cues for posture and proximity to RW. Pt walked 30' x 2 then additional 15' all with seated rest between trials Gait velocity: decreased Gait velocity interpretation: 1.31 -  2.62 ft/sec, indicative of limited community ambulator Pre-gait activities: sidesteps x3 to his R side using RW, shuffled     Posture / Balance Dynamic Sitting Balance Sitting balance - Comments: sitting on EOB without assitance Balance Overall balance assessment: Needs assistance Sitting-balance support: No upper extremity supported, Feet supported Sitting balance-Leahy Scale: Good Sitting balance - Comments: sitting on EOB without assitance Standing balance support: Bilateral upper extremity supported, No upper extremity supported, During functional activity Standing balance-Leahy Scale: Poor Standing balance comment: able to stand at sink for hand hygiene, one extremity support to wash face     Special needs/care consideration AICD Seizure precautions IR drain and recent surgical wound CPAP at HS    Previous Home Environment  Living Arrangements:  (adult son lives with him)  Lives With: Son Available Help at Discharge:  (son works from home) Type of Home: House Home Layout: Two level, Bed/bath upstairs, 1/2 bath on main level Alternate Level Stairs-Rails: Right, Left, Can reach both Alternate Level Stairs-Number of Steps: 14 Home Access: Level entry Bathroom Shower/Tub: Chiropodist: Standard Bathroom Accessibility: Yes How Accessible: Accessible via walker Home Care Services: No Additional Comments: pt states not needing DME, son assists with driving and errands   Discharge Living Setting Plans for Discharge Living Setting: Patient's home, Lives with (comment) (adult son) Type of Home at Discharge: House Discharge Home Layout: Two level, Bed/bath upstairs, 1/2 bath on main level Alternate Level Stairs-Rails: Right, Left, Can reach both Alternate Level Stairs-Number of Steps: 14 Discharge Home Access: Level entry Discharge Bathroom Shower/Tub: Tub/shower unit Discharge Bathroom Toilet: Standard Discharge Bathroom Accessibility: Yes How Accessible:  Accessible via walker Does the patient have any problems obtaining your medications?: No   Social/Family/Support Systems Patient Roles: Parent Contact Information: daughter and son Anticipated Caregiver: adult son in the home Anticipated Caregiver's Contact Information: see contacts Ability/Limitations of Caregiver: none Caregiver Availability: 24/7 Discharge Plan Discussed with Primary Caregiver: Yes Is Caregiver In  Agreement with Plan?: Yes Does Caregiver/Family have Issues with Lodging/Transportation while Pt is in Rehab?: No   Goals Patient/Family Goal for Rehab: Mod I to intermittent supervision with PT and OT Expected length of stay: ELOS 10 to 12 days Additional Information: Workers compensation due to cardiac issues 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 I met with patient at the bedside for inpatient rehabilitation assessment.  Patient will benefit from ongoing PT and OT, can actively participate in 3 hours of therapy a day 5 days of the week, and can make measurable gains during the admission.  Patient will also benefit from the coordinated team approach during an Inpatient Acute Rehabilitation admission.  The patient will receive intensive therapy as well as Rehabilitation physician, nursing, social worker, and care management interventions.  Due to bladder management, bowel management, safety, skin/wound care, disease management, medication administration, pain management, and patient education the patient requires 24 hour a day rehabilitation nursing.  The patient is currently min assist with mobility  and basic ADLs.  Discharge setting and therapy post discharge at home with home health is anticipated.  Patient has agreed to participate in the Acute Inpatient Rehabilitation Program and will admit today.   Preadmission Screen Completed By: Danne Baxter RN MSN with updates by Michel Santee, 05/22/2022 10:10 AM ______________________________________________________________________   Discussed status with Dr. Naaman Plummer on 05/22/22  at 10:10 AM  and received approval for admission today.   Admission Coordinator:  Danne Baxter RN MSN with updates by Michel Santee, PT, time 10:10 AM  Sudie Grumbling 05/22/22     Assessment/Plan: Diagnosis: debility Does the need for close, 24 hr/day Medical supervision in concert with the patient's rehab needs make it unreasonable for this patient to be served in a less intensive setting? Yes Co-Morbidities requiring supervision/potential complications: cad, csCHF, VT, afib, htn, dm Due to bladder management, bowel management, safety, skin/wound care, disease management, medication administration, pain management, and patient education, does the patient require 24 hr/day rehab nursing? Yes Does the patient require coordinated care of a physician, rehab nurse, PT, OT, and SLP to address physical and functional deficits in the context of the above medical diagnosis(es)? Yes Addressing deficits in the following areas: balance, endurance, locomotion, strength, transferring, bowel/bladder control, bathing, dressing, feeding, grooming, toileting, and psychosocial support Can the patient actively participate in an intensive therapy program of at least 3 hrs of therapy 5 days a week? Yes The potential for patient to make measurable gains while on inpatient rehab is excellent Anticipated functional outcomes upon discharge from inpatient rehab: modified independent PT, modified independent OT, n/a SLP Estimated rehab length of stay to reach the above functional goals is: 10-12  days Anticipated discharge destination: Home 10. Overall Rehab/Functional Prognosis: excellent     MD Signature: Meredith Staggers, MD, [place] Director Rehabilitation Services 05/22/2022

## 2022-05-22 NOTE — Progress Notes (Signed)
Inpatient Rehabilitation Admission Medication Review by a Pharmacist  A complete drug regimen review was completed for this patient to identify any potential clinically significant medication issues.  High Risk Drug Classes Is patient taking? Indication by Medication  Antipsychotic Yes Prochlorperazine: PRN nausea/vomiting  Anticoagulant Yes Apixaban: afib and DVT treatment  Antibiotic Yes Augmentin: abdominal infection  Opioid Yes Oxycodone: PRN pain  Antiplatelet Yes Clopidogrel: CAD stents  Hypoglycemics/insulin Yes Dapagliflozin: CHF  Vasoactive Medication Yes Amiodarone, mexiletine: afib/ recurrent vtach  Furosemide, metoprolol, Entresto: CHF, HTN  Chemotherapy No   Other Yes Atorvastatin, ezetimibe: HLD Cyclobenzaprine: PRN muscle spasms Docusate, miralax : constipation Levetiracetam: seizure ppx Levothyroxine: hypothyroid tx Methocarbamol: PRN muscle spasms Pantoprazole: GERD ppx Kcl 10: supplementation Trazodone: PRN sleep     Type of Medication Issue Identified Description of Issue Recommendation(s)  Drug Interaction(s) (clinically significant)     Duplicate Therapy     Allergy     No Medication Administration End Date     Incorrect Dose     Additional Drug Therapy Needed     Significant med changes from prior encounter (inform family/care partners about these prior to discharge). - Lasix changed to '40mg'$  PO daily instead of MWF Communicate medication changes with patient/family at discharge  Other       Clinically significant medication issues were identified that warrant physician communication and completion of prescribed/recommended actions by midnight of the next day:  No   Pharmacist comments: n/a   Time spent performing this drug regimen review (minutes): 20   Thank you for allowing pharmacy to be a part of this patient's care.  Ardyth Harps, PharmD Clinical Pharmacist

## 2022-05-23 DIAGNOSIS — E8809 Other disorders of plasma-protein metabolism, not elsewhere classified: Secondary | ICD-10-CM

## 2022-05-23 DIAGNOSIS — I5042 Chronic combined systolic (congestive) and diastolic (congestive) heart failure: Secondary | ICD-10-CM

## 2022-05-23 DIAGNOSIS — D649 Anemia, unspecified: Secondary | ICD-10-CM

## 2022-05-23 DIAGNOSIS — T8143XA Infection following a procedure, organ and space surgical site, initial encounter: Secondary | ICD-10-CM | POA: Diagnosis not present

## 2022-05-23 DIAGNOSIS — I25810 Atherosclerosis of coronary artery bypass graft(s) without angina pectoris: Secondary | ICD-10-CM

## 2022-05-23 DIAGNOSIS — R5381 Other malaise: Secondary | ICD-10-CM | POA: Diagnosis not present

## 2022-05-23 LAB — GLUCOSE, CAPILLARY: Glucose-Capillary: 75 mg/dL (ref 70–99)

## 2022-05-23 LAB — COMPREHENSIVE METABOLIC PANEL
ALT: 24 U/L (ref 0–44)
AST: 22 U/L (ref 15–41)
Albumin: 1.7 g/dL — ABNORMAL LOW (ref 3.5–5.0)
Alkaline Phosphatase: 122 U/L (ref 38–126)
Anion gap: 7 (ref 5–15)
BUN: 11 mg/dL (ref 8–23)
CO2: 28 mmol/L (ref 22–32)
Calcium: 8 mg/dL — ABNORMAL LOW (ref 8.9–10.3)
Chloride: 101 mmol/L (ref 98–111)
Creatinine, Ser: 0.85 mg/dL (ref 0.61–1.24)
GFR, Estimated: >60 mL/min (ref >60)
Glucose, Bld: 82 mg/dL (ref 70–99)
Potassium: 3.6 mmol/L (ref 3.5–5.1)
Sodium: 136 mmol/L (ref 135–145)
Total Bilirubin: 0.2 mg/dL — ABNORMAL LOW (ref 0.3–1.2)
Total Protein: 6 g/dL — ABNORMAL LOW (ref 6.5–8.1)

## 2022-05-23 LAB — CBC WITH DIFFERENTIAL/PLATELET
Abs Immature Granulocytes: 0.07 10*3/uL (ref 0.00–0.07)
Basophils Absolute: 0 10*3/uL (ref 0.0–0.1)
Basophils Relative: 0 %
Eosinophils Absolute: 0 10*3/uL (ref 0.0–0.5)
Eosinophils Relative: 1 %
HCT: 34.3 % — ABNORMAL LOW (ref 39.0–52.0)
Hemoglobin: 10 g/dL — ABNORMAL LOW (ref 13.0–17.0)
Immature Granulocytes: 1 %
Lymphocytes Relative: 6 %
Lymphs Abs: 0.5 10*3/uL — ABNORMAL LOW (ref 0.7–4.0)
MCH: 24.9 pg — ABNORMAL LOW (ref 26.0–34.0)
MCHC: 29.2 g/dL — ABNORMAL LOW (ref 30.0–36.0)
MCV: 85.5 fL (ref 80.0–100.0)
Monocytes Absolute: 0.8 10*3/uL (ref 0.1–1.0)
Monocytes Relative: 10 %
Neutro Abs: 6.7 10*3/uL (ref 1.7–7.7)
Neutrophils Relative %: 82 %
Platelets: 316 10*3/uL (ref 150–400)
RBC: 4.01 MIL/uL — ABNORMAL LOW (ref 4.22–5.81)
RDW: 24.2 % — ABNORMAL HIGH (ref 11.5–15.5)
WBC: 8.1 10*3/uL (ref 4.0–10.5)
nRBC: 0 % (ref 0.0–0.2)

## 2022-05-23 MED ORDER — SACUBITRIL-VALSARTAN 49-51 MG PO TABS
1.0000 | ORAL_TABLET | Freq: Two times a day (BID) | ORAL | Status: DC
  Administered 2022-05-23 – 2022-06-06 (×29): 1 via ORAL
  Filled 2022-05-23 (×29): qty 1

## 2022-05-23 NOTE — Progress Notes (Signed)
Inpatient Rehabilitation Care Coordinator Assessment and Plan Patient Details  Name: Gregory [name] MRN: [id number] Date of Birth: Mar 21, 1961  Today's Date: 05/23/2022  Hospital Problems: Principal Problem:   Debility Active Problems:   Postprocedural intraabdominal abscess  Past Medical History:  Past Medical History:  Diagnosis Date   AICD (automatic cardioverter/defibrillator) present 2005   CAD (coronary artery disease) 12/01/2013   Chronic combined systolic and diastolic CHF, NYHA class 1 ([name]) 12/01/2013   Erectile dysfunction 12/01/2013   HTN (hypertension) 12/01/2013   Hyperlipidemia 12/01/2013   Ischemic cardiomyopathy 12/01/2013   Presence of permanent cardiac pacemaker    Sleep apnea    Past Surgical History:  Past Surgical History:  Procedure Laterality Date   ACHILLES TENDON REPAIR     lft foot   BIOPSY  04/16/2022   Procedure: BIOPSY;  Surgeon: Yetta Flock, MD;  Location: [facility];  Service: Gastroenterology;;   CARDIAC CATHETERIZATION N/A 05/05/2016   Procedure: Left Heart Cath and Coronary Angiography;  Surgeon: Leonie Man, MD;  Location: [facility];  Service: Cardiovascular;  Laterality: N/A;   CARDIAC DEFIBRILLATOR PLACEMENT     CARDIOVERSION N/A 09/13/2020   Procedure: CARDIOVERSION;  Surgeon: Werner Lean, MD;  Location: [facility];  Service: Cardiovascular;  Laterality: N/A;   CARDIOVERSION N/A 09/19/2020   Procedure: CARDIOVERSION;  Surgeon: [name], MD;  Location: [facility] ENDOSCOPY;  Service: Cardiovascular;  Laterality: N/A;   COLON RESECTION N/A 04/18/2022   Procedure: HAND ASSISTED LAPAROSCOPIC RIGHT COLON RESECTION;  Surgeon: Felicie Morn, MD;  Location: [facility];  Service: General;  Laterality: N/A;   COLONOSCOPY WITH PROPOFOL N/A 04/16/2022   Procedure: COLONOSCOPY WITH PROPOFOL;  Surgeon: Yetta Flock, MD;  Location: [facility];  Service: Gastroenterology;  Laterality: N/A;   EP  IMPLANTABLE DEVICE N/A 12/19/2016   Procedure: ICD Generator Changeout;  Surgeon: [name], MD;  Location: [facility];  Service: Cardiovascular;  Laterality: N/A;   ESOPHAGOGASTRODUODENOSCOPY N/A 04/16/2022   Procedure: ESOPHAGOGASTRODUODENOSCOPY (EGD);  Surgeon: Yetta Flock, MD;  Location: [facility] ENDOSCOPY;  Service: Gastroenterology;  Laterality: N/A;   IR ANGIOGRAM SELECTIVE EACH ADDITIONAL VESSEL  05/09/2022   IR ANGIOGRAM VISCERAL SELECTIVE  05/09/2022   IR CATHETER TUBE CHANGE  05/14/2022   IR EMBO ART  VEN HEMORR LYMPH EXTRAV  INC GUIDE ROADMAPPING  05/09/2022   IR IMAGE GUIDED DRAINAGE PERCUT CATH  PERITONEAL RETROPERIT  05/09/2022   IR US GUIDE VASC ACCESS RIGHT  05/09/2022   LEFT HEART CATH AND CORONARY ANGIOGRAPHY N/A 04/17/2022   Procedure: LEFT HEART CATH AND CORONARY ANGIOGRAPHY;  Surgeon: Belva Crome, MD;  Location: [place] [facility];  Service: Cardiovascular;  Laterality: N/A;   POLYPECTOMY  04/16/2022   Procedure: POLYPECTOMY;  Surgeon: Yetta Flock, MD;  Location: [facility] ENDOSCOPY;  Service: Gastroenterology;;   RIGHT/LEFT HEART CATH AND CORONARY ANGIOGRAPHY N/A 09/24/2020   Procedure: RIGHT/LEFT HEART CATH AND CORONARY ANGIOGRAPHY;  Surgeon: Lorretta Harp, MD;  Location: [facility];  Service: Cardiovascular;  Laterality: N/A;   SUBMUCOSAL TATTOO INJECTION  04/16/2022   Procedure: SUBMUCOSAL TATTOO INJECTION;  Surgeon: Yetta Flock, MD;  Location: [facility];  Service: Gastroenterology;;   WRIST TENODESIS     Social History:  reports that he quit smoking about 8 years ago. His smoking use included cigarettes. He has a 30.00 pack-year smoking history. He has never used smokeless tobacco. He reports that he does not currently use alcohol. He reports that  he does not use drugs.  Family / Support Systems Marital Status: Single Patient Roles: Parent, Other (Comment) (Son) Children: [place] [phone]  Anisha-daughter [phone] Other Supports:  Eustace Quail [phone] Anticipated Caregiver: Son lives with and works from home Ability/Limitations of Caregiver: None Caregiver Availability: 24/7 Family Dynamics: Close with his children and his fmaily is very involved and will assist if pt allows them too.  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 He is very much one who does not ask for assist from others and will push himself to achieve his goals. Recent Psychosocial Issues: Cardiac issues and needing an OP PET scan is aware needed to be re-scheduled-due to here. Psychiatric History: No history or issues-he is one who re;lies upon himself and feels he can handle wahtever is thrown at him due to the person he is and the previous job he had. Discussed neuro-psych services and he felt not needed. Substance Abuse History: No issues  Patient / Family Perceptions, Expectations & Goals Pt/Family understanding of illness & functional limitations: Pt talks with the MD and feels he has a good  understanding of his condition and treatment plan movng forward. He wants MD to tell him truthfully when asks a questions and not to sugar coat anything, he realizes life is hard and not always fair Premorbid pt/family roles/activities: father, son, retiree, friend, etc Anticipated changes in roles/activities/participation: resume Pt/family expectations/goals: Pt states: " I will do whatever they need me to do to regain my independence, I will get there I always have."  [employer]: Other (Comment) Premorbid Home Care/DME Agencies: Other (Comment) (CPAP, cane, tub seat) Transportation available at discharge: self son can transport if needed Is the patient able to respond to transportation needs?: Yes In the past 12 months, has lack of transportation kept you from medical appointments or from getting medications?: No In the past 12 months, has lack of transportation kept you from meetings, work, or from getting things needed for daily living?: No  Discharge Planning Living Arrangements: Children Support Systems: Children, Parent, Friends/neighbors Type of Residence: Private residence Insurance Resources: Multimedia programmer (specify) (Workers Comp-if heart related) Financial Resources: Other (Comment) (Police pension) Financial Screen Referred: No Living Expenses: Lives with family Money Management: Patient Does the patient have any problems obtaining your medications?: No Home Management: both he and son Patient/Family Preliminary Plans: Return home with his son who is there working remotely and can assist. Pt does not feel he will need assist and plans on being mod/i level at discharge. He has been on CIR in 2021 and aware of the process. Care Coordinator Barriers to Discharge: Wound Care, Insurance for SNF coverage Care Coordinator Anticipated Follow Up Needs: HH/OP  Clinical Impression This gentleman is known to this worker from his admission in 2021. He is  very stubborn and set to be independent at discharge. He has good family support and son will assist if pt allows him too. Aware team setting goals and evaluating today. Informed pt this worker will be off next week and one of my co-workers will update him on Wednesday regarding team conference results.  Elease Hashimoto 05/23/2022, 11:42 AM

## 2022-05-23 NOTE — Evaluation (Signed)
Occupational Therapy Assessment and Plan  Patient Details  Name: Gregory [name] MRN: [id number] Date of Birth: Jun 24, 1961  OT Diagnosis: acute pain, lumbago (low back pain), muscle weakness (generalized), pain in joint, and swelling of limb Rehab Potential: Rehab Potential (ACUTE ONLY): Good ELOS: 14-18 days   Today's Date: 05/23/2022 OT Individual Time: 0900-1000 OT Individual Time Calculation (min): 60 min     Hospital Problem: Principal Problem:   Debility Active Problems:   Postprocedural intraabdominal abscess   Past Medical History:  Past Medical History:  Diagnosis Date   AICD (automatic cardioverter/defibrillator) present 2005   CAD (coronary artery disease) 12/01/2013   Chronic combined systolic and diastolic CHF, NYHA class 1 ([place]) 12/01/2013   Erectile dysfunction 12/01/2013   HTN (hypertension) 12/01/2013   Hyperlipidemia 12/01/2013   Ischemic cardiomyopathy 12/01/2013   Presence of permanent cardiac pacemaker    Sleep apnea    Past Surgical History:  Past Surgical History:  Procedure Laterality Date   ACHILLES TENDON REPAIR     lft foot   BIOPSY  04/16/2022   Procedure: BIOPSY;  Surgeon: Yetta Flock, MD;  Location: [place];  Service: Gastroenterology;;   CARDIAC CATHETERIZATION N/A 05/05/2016   Procedure: Left Heart Cath and Coronary Angiography;  Surgeon: Leonie Man, MD;  Location: [place] [facility];  Service: Cardiovascular;  Laterality: N/A;   CARDIAC DEFIBRILLATOR PLACEMENT     CARDIOVERSION N/A 09/13/2020   Procedure: CARDIOVERSION;  Surgeon: Werner Lean, MD;  Location: [facility];  Service: Cardiovascular;  Laterality: N/A;   CARDIOVERSION N/A 09/19/2020   Procedure: CARDIOVERSION;  Surgeon: [name], MD;  Location: [facility] ENDOSCOPY;  Service: Cardiovascular;  Laterality: N/A;   COLON RESECTION N/A 04/18/2022   Procedure: HAND ASSISTED LAPAROSCOPIC RIGHT COLON RESECTION;  Surgeon: Felicie Morn, MD;   Location: [place];  Service: General;  Laterality: N/A;   COLONOSCOPY WITH PROPOFOL N/A 04/16/2022   Procedure: COLONOSCOPY WITH PROPOFOL;  Surgeon: Yetta Flock, MD;  Location: [place];  Service: Gastroenterology;  Laterality: N/A;   EP IMPLANTABLE DEVICE N/A 12/19/2016   Procedure: ICD Generator Changeout;  Surgeon: [name], MD;  Location: [place] [facility];  Service: Cardiovascular;  Laterality: N/A;   ESOPHAGOGASTRODUODENOSCOPY N/A 04/16/2022   Procedure: ESOPHAGOGASTRODUODENOSCOPY (EGD);  Surgeon: Yetta Flock, MD;  Location: [facility] ENDOSCOPY;  Service: Gastroenterology;  Laterality: N/A;   IR ANGIOGRAM SELECTIVE EACH ADDITIONAL VESSEL  05/09/2022   IR ANGIOGRAM VISCERAL SELECTIVE  05/09/2022   IR CATHETER TUBE CHANGE  05/14/2022   IR EMBO ART  VEN HEMORR LYMPH EXTRAV  INC GUIDE ROADMAPPING  05/09/2022   IR IMAGE GUIDED DRAINAGE PERCUT CATH  PERITONEAL RETROPERIT  05/09/2022   IR US GUIDE VASC ACCESS RIGHT  05/09/2022   LEFT HEART CATH AND CORONARY ANGIOGRAPHY N/A 04/17/2022   Procedure: LEFT HEART CATH AND CORONARY ANGIOGRAPHY;  Surgeon: Belva Crome, MD;  Location: [place] [facility];  Service: Cardiovascular;  Laterality: N/A;   POLYPECTOMY  04/16/2022   Procedure: POLYPECTOMY;  Surgeon: Yetta Flock, MD;  Location: [facility] ENDOSCOPY;  Service: Gastroenterology;;   RIGHT/LEFT HEART CATH AND CORONARY ANGIOGRAPHY N/A 09/24/2020   Procedure: RIGHT/LEFT HEART CATH AND CORONARY ANGIOGRAPHY;  Surgeon: Lorretta Harp, MD;  Location: [place] [facility];  Service: Cardiovascular;  Laterality: N/A;   SUBMUCOSAL TATTOO INJECTION  04/16/2022   Procedure: SUBMUCOSAL TATTOO INJECTION;  Surgeon: Yetta Flock, MD;  Location: [facility] ENDOSCOPY;  Service: Gastroenterology;;   WRIST TENODESIS  Assessment & Plan Clinical Impression:  Gregory [name] is a 61 year old male with recent diagnosis of colon mass status post right colon resection on Apr 18, 2022.  His history is also  significant for ventricular tachycardia status post AICD in 2006.  He was brought to the emergency department on 5/25 via EMS after experiencing palpitations and shock discharge from his ICD.  Electrophysiology consultation was obtained and he was placed on amiodarone infusion.  CT scan of the abdomen pelvis performed and showed a very large intra-abdominal abscess as well as pseudoaneurysm of the splenic artery.  General surgery was consulted who discussed findings with interventional radiology.  Broad-spectrum antibiotics started.  CT angiogram was performed the following morning.  He underwent percutaneous drainage of the abscess and embolization of the splenic artery pseudoaneurysm on 5/26 by Dr. Dwaine Gale.  Infectious disease consultation was obtained.  Cultures grew E. coli and strep anginosus sensitive to Unasyn.   He continued to have intermittent runs of ventricular tachycardia and electrophysiology continued monitoring.  He has a history of coronary artery disease status post PCI/stent with no anginal symptoms.  Follow-up abdominal CT revealed persistent intra-abdominal abscess and on 5/31, he underwent catheter exchange and repositioning by IR.     During his previous hospitalization, he was found to have left upper extremity DVT in the left basilic vein on 5/8.  Due to upper extremity edema a repeat duplex study was performed on 6/1 with findings of left brachial vein. thrombosis.  Heparin infusion initiated. Transitioned to Eliquis.   He was transitioned to oral amiodarone on 6/2, however he developed recurrent VT and was placed back on amiodarone intravenously.  Mexiletine was initiated.  Able to transition back to amiodarone 400 mg p.o. twice daily on 6/6.  Infectious disease follow-up on 6/7 recommend switching to amoxicillin/clavulanate 875 mg twice daily for 3-4 additional weeks and recommends repeat CT of the abdomen around 6/12.  Percutaneous drain continues.The patient requires inpatient  medicine and rehabilitation evaluations and services for ongoing dysfunction secondary to sepsis, intra-abdominal abscess and recent colon surgery. Patient transferred to CIR on 05/22/2022 .    Patient currently requires max with basic self-care skills secondary to muscle weakness, decreased cardiorespiratoy endurance, decreased coordination, and decreased standing balance and RUE and BLE edema .  Prior to hospitalization, patient could complete all basic self-care with independence.  Patient will benefit from skilled intervention to increase independence with basic self-care skills prior to discharge  home with son .  Anticipate patient will require intermittent supervision and follow up home health.  OT - End of Session Activity Tolerance: Tolerates < 10 min activity, no significant change in vital signs Endurance Deficit: Yes Endurance Deficit Description: required seated rest breaks during standing with ADLs OT Assessment Rehab Potential (ACUTE ONLY): Good OT Barriers to Discharge: Home environment access/layout;Lack of/limited family support OT Patient demonstrates impairments in the following area(s): Balance;Edema;Endurance;Motor;Pain OT Basic ADL's Functional Problem(s): Bathing;Dressing;Toileting OT Transfers Functional Problem(s): Toilet;Tub/Shower OT Additional Impairment(s): Fuctional Use of Upper Extremity OT Plan OT Intensity: Minimum of 1-2 x/day, 45 to 90 minutes OT Frequency: 5 out of 7 days OT Duration/Estimated Length of Stay: 14-18 days OT Treatment/Interventions: Balance/vestibular training;Discharge planning;Pain management;Self Care/advanced ADL retraining;Therapeutic Activities;UE/LE Coordination activities;Functional mobility training;Patient/family education;Therapeutic Exercise;DME/adaptive equipment instruction;UE/LE Strength taining/ROM;Wheelchair propulsion/positioning;Splinting/orthotics OT Self Feeding Anticipated Outcome(s): Mod I OT Basic Self-Care Anticipated  Outcome(s): Mod I OT Toileting Anticipated Outcome(s): Supervision OT Bathroom Transfers Anticipated Outcome(s): Supervision OT Recommendation Patient destination: Home Follow Up Recommendations: Home health OT Equipment Recommended:  To be determined   OT Evaluation Precautions/Restrictions  Precautions Precautions: Fall Precaution Comments: watch O2 and HR, jp drain Restrictions Weight Bearing Restrictions: No Home Living/Prior Functioning Home Living Family/patient expects to be discharged to:: Private residence Living Arrangements: Spouse/significant other, Children Available Help at Discharge: Family, Available PRN/intermittently (son works from home) Type of Home: House Home Access: Level entry Home Layout: Two level, Bed/bath upstairs, 1/2 bath on main level Alternate Level Stairs-Number of Steps: 14 Alternate Level Stairs-Rails: Right, Left, Can reach both Bathroom Shower/Tub: Tub/shower unit [employer]: Standard (toilet seat raiser) Bathroom Accessibility: Yes Additional Comments: pt states not needing DME, son assists with driving and errands  Lives With: Son IADL History Homemaking Responsibilities: No Mode of Transportation: Car (SUV) Prior Function Level of Independence: Independent with basic ADLs, Needs assistance with homemaking, Independent with gait Meal Prep: Total  Able to Take Stairs?: Reciprically Driving: No Vision Baseline Vision/History: 1 Wears glasses (as needed) Ability to See in Adequate Light: 0 Adequate Patient Visual Report: No change from baseline Vision Assessment?: No apparent visual deficits Perception  Perception: Within Functional Limits Praxis Praxis: Intact Cognition Cognition Overall Cognitive Status: Within Functional Limits for tasks assessed Arousal/Alertness: Awake/alert Orientation Level: Person;Place;Situation Person: Oriented Place: Oriented Situation: Oriented Memory: Appears intact Awareness: Appears  intact Problem Solving: Appears intact Safety/Judgment: Appears intact Brief Interview for Mental Status (BIMS) Repetition of Three Words (First Attempt): 3 Temporal Orientation: Year: Correct Temporal Orientation: Month: Accurate within 5 days Temporal Orientation: Day: Incorrect Recall: "Sock": Yes, no cue required Recall: "Blue": Yes, no cue required Recall: "Bed": Yes, no cue required BIMS Summary Score: 14 Sensation Sensation Light Touch: Appears Intact Hot/Cold: Not tested Proprioception: Appears Intact Stereognosis: Not tested Coordination Gross Motor Movements are Fluid and Coordinated: No Fine Motor Movements are Fluid and Coordinated: No Coordination and Movement Description: generalized weakness 2/2 debility and deconditioning Finger Nose Finger Test: Dysmetria bilaterally, limited on LUE 2/2 edema/pain Motor  Motor Motor: Other (comment) Motor - Skilled Clinical Observations: generalized deconditioning.  Trunk/Postural Assessment  Cervical Assessment Cervical Assessment: Within Functional Limits Thoracic Assessment Thoracic Assessment: Within Functional Limits Lumbar Assessment Lumbar Assessment: Exceptions to [facility] (posterior pelvic tilt in sitting) Postural Control Postural Control: Within Functional Limits  Balance Balance Balance Assessed: Yes Static Sitting Balance Static Sitting - Balance Support: Feet supported;Bilateral upper extremity supported Static Sitting - Level of Assistance: 5: Stand by assistance (supervision) Dynamic Sitting Balance Dynamic Sitting - Balance Support: Feet supported;Left upper extremity supported Dynamic Sitting - Level of Assistance: 5: Stand by assistance (CGA) Dynamic Standing Balance Dynamic Standing - Balance Support: During functional activity;Bilateral upper extremity supported Dynamic Standing - Level of Assistance: 4: Min assist Extremity/Trunk Assessment RUE Assessment RUE Assessment: Within Functional Limits LUE  Assessment LUE Assessment: Exceptions to [facility] Active Range of Motion (AROM) Comments: Shoulder flexion <90 General Strength Comments: 3-/5 proximally, WFL gross grasp  Care Tool Care Tool Self Care Eating   Eating Assist Level: Set up assist    Oral Care    Oral Care Assist Level: Set up assist    Bathing   Body parts bathed by patient: Right arm;Left arm;Chest;Abdomen;Right upper leg;Left upper leg;Face Body parts bathed by helper: Front perineal area;Buttocks;Right lower leg;Left lower leg   Assist Level: Moderate Assistance - Patient 50 - 74%    Upper Body Dressing(including orthotics)   What is the patient wearing?: Pull over shirt   Assist Level: Minimal Assistance - Patient > 75%    Lower Body Dressing (excluding footwear)  What is the patient wearing?: Underwear/pull up;Pants Assist for lower body dressing: Maximal Assistance - Patient 25 - 49%    Putting on/Taking off footwear   What is the patient wearing?: Non-skid slipper socks Assist for footwear: Total Assistance - Patient < 25%       Care Tool Toileting Toileting activity   Assist for toileting: Maximal Assistance - Patient 25 - 49%     Care Tool Bed Mobility Roll left and right activity        Sit to lying activity        Lying to sitting on side of bed activity         Care Tool Transfers Sit to stand transfer   Sit to stand assist level: Minimal Assistance - Patient > 75%    Chair/bed transfer         Toilet transfer   Assist Level: Minimal Assistance - Patient > 75% (simulated, side step)     Care Tool Cognition  Expression of Ideas and Wants Expression of Ideas and Wants: 4. Without difficulty (complex and basic) - expresses complex messages without difficulty and with speech that is clear and easy to understand  Understanding Verbal and Non-Verbal Content Understanding Verbal and Non-Verbal Content: 4. Understands (complex and basic) - clear comprehension without cues or repetitions    Memory/Recall Ability Memory/Recall Ability : That he or she is in a hospital/hospital unit;Current season;Location of own room   Refer to Care Plan for Long Term Goals  SHORT TERM GOAL WEEK 1 OT Short Term Goal 1 (Week 1): Pt will donn LB with AE PRN with mod A OT Short Term Goal 2 (Week 1): Pt will tolerate stance position for >2 mins to promote standing endurance needed for self-care tasks OT Short Term Goal 3 (Week 1): Pt will complete short ambulatory transfer to toilet with min A using LRAD  Recommendations for other services: None    Skilled Therapeutic Intervention Skilled OT intervention completed with discussion on POC, rehab goals and explanation of OT purpose. Pt received supine in bed, agreeable to session. 7/10 pain reported in low back, however just received meds, with therapist offering rest breaks and repositioning for pain reduction. Pt completed bathing/dressing at EOB. See caretool for further details on assist level with self-care tasks performed. Pt with edematous LUE, mostly in forearm region, with report of heaviness from previous clot that was found with therapist providing education about edema management and positioning with pillows and light AROM exercises. Pt reporting light-headedness on multiple occassions with positional changes, with BP checked sitting EOB at 163/118. Notified nursing who reported to MD about BP meds that had been stopped. Sit > stand without AD at mod A, however pt with limited endurance, therefore utilized RW for side step for simulated toilet transfer towards [place]. Max A needed for bed mobility back into bed. Pt left upright in bed with bed alarm on and all needs in reach at end of session.   ADL ADL Eating: Set up Where Assessed-Eating: Bed level Grooming: Setup Where Assessed-Grooming: Edge of bed Upper Body Bathing: Supervision/safety Where Assessed-Upper Body Bathing: Edge of bed Lower Body Bathing: Moderate assistance Where  Assessed-Lower Body Bathing: Edge of bed Upper Body Dressing: Minimal assistance Where Assessed-Upper Body Dressing: Edge of bed Lower Body Dressing: Maximal assistance Where Assessed-Lower Body Dressing: Edge of bed Toileting: Maximal assistance Where Assessed-Toileting: Other (Comment) (simulated at EOB) Toilet Transfer: Minimal assistance Toilet Transfer Method: Other (comment) (side step, simulated) Tub/Shower Transfer: Unable  to assess Tub/Shower Transfer Method: Unable to assess Social research officer, government: Unable to assess [employer] Transfer Method: Unable to assess Mobility  Bed Mobility Bed Mobility: Right Sidelying to Sit;Sitting - Scoot to Edge of Bed Right Sidelying to Sit: Maximal Assistance - Patient 25-49% Sitting - Scoot to Edge of Bed: Minimal Assistance - Patient > 75% Transfers Sit to Stand: Moderate Assistance - Patient 50-74%   Discharge Criteria: Patient will be discharged from OT if patient refuses treatment 3 consecutive times without medical reason, if treatment goals not met, if there is a change in medical status, if patient makes no progress towards goals or if patient is discharged from hospital.  The above assessment, treatment plan, treatment alternatives and goals were discussed and mutually agreed upon: by patient   Blase Mess, MS, OTR/L  05/23/2022, 10:07 AM

## 2022-05-23 NOTE — Progress Notes (Signed)
Pt c/o 8/10 pain in lower back, unable to receive prn percocet or tylenol due to reaching maximum mg dose. MD made aware and hydrothermia ordered and ice packs.   Pt shortly after c/o palpations he has previously experienced. PA Katharine Look made a ware, ekg ordered and complete. Bp- 124/81, rr-16 pulse 83 No other orders placed at this time

## 2022-05-23 NOTE — Discharge Summary (Signed)
Physician Discharge Summary  Patient ID: Gregory [name] MRN: [id number] DOB/AGE: [birth year] 61 y.o.  Admit date: 05/22/2022 Discharge date: 06/06/2022  Discharge Diagnoses:  Principal Problem:   Debility Active Problems:   Anemia   Postprocedural intraabdominal abscess Debility secondary to intra-abdominal abscess, sepsis, colectomy Metastatic colon cancer Coronary artery disease Recurrent ventricular tachycardia Chronic systolic and diastolic heart failure Paroxysmal atrial fibrillation Left brachial DVT Obstructive sleep apnea Seizure disorder Diabetes mellitus Hyperlipidemia Right scapula pain  Discharged Condition: stable  Significant Diagnostic Studies:  Narrative & Impression  CLINICAL DATA:  Neck pain   EXAM: CERVICAL SPINE - COMPLETE 4 VIEW   COMPARISON:  None Available.   FINDINGS: There is no evidence of cervical spine fracture or prevertebral soft tissue swelling. Alignment is normal. Moderate multilevel degenerative disc disease, most pronounced at C2-C3, C3-C4 and C6-C7. Moderate multilevel facet arthropathy. No other significant bone abnormalities are identified. Vascular calcifications.   IMPRESSION: 1. No acute osseous abnormality. 2. Moderate degenerative changes.     Electronically Signed   By: Yetta Glassman M.D.   On: 06/03/2022 14:34     Narrative & Impression  CLINICAL DATA:  Neck pain, chronic with right arm weaker than the left.   EXAM: CT CERVICAL SPINE WITHOUT CONTRAST   TECHNIQUE: Multidetector CT imaging of the cervical spine was performed without intravenous contrast. Multiplanar CT image reconstructions were also generated.   RADIATION DOSE REDUCTION: This exam was performed according to the departmental dose-optimization program which includes automated exposure control, adjustment of the mA and/or kV according to patient size and/or use of iterative reconstruction technique.   COMPARISON:  Cervical  radiography from 2 days ago   FINDINGS: Alignment: Borderline retrolisthesis at C4-5.   Skull base and vertebrae: No evidence of fracture or bone lesion.   Soft tissues and spinal canal: No prevertebral fluid or swelling. No visible canal hematoma. Cervical carotid atherosclerosis which is extensive. Partial coverage of layering left pleural effusion.   Disc levels:   C2-3: Incomplete segmentation.   C3-4: Moderate disc narrowing with endplate and uncovertebral ridging. Mild facet spurring. Ridging is greater on the left where there is moderate bony stenosis.   C4-5: Disc narrowing with endplate and uncovertebral ridging. Mild facet spurring. Patent canal and foramina   C5-6: Disc narrowing with eccentric right ridging including the uncovertebral joint where there is moderate to advanced foraminal stenosis.   C6-7: Disc space narrowing with mainly ventral endplate ridging. No neural impingement   C7-T1:Mild disc narrowing with ventral spurring.   Upper chest: Layering left pleural effusion   IMPRESSION: 1. Ordinary, generalized cervical spine degeneration with foraminal impingement on the left at C3-4 and right at C5-6. 2. No evidence of spinal cord impingement. 3. Layering left pleural effusion.     Electronically Signed   By: Jorje Guild M.D.   On: 06/05/2022 19:04      Labs:  Basic Metabolic Panel: Recent Labs  Lab 06/06/22 0514  NA 134*  K 4.4  CL 101  CO2 27  GLUCOSE 125*  BUN 11  CREATININE 0.77  CALCIUM 8.2*    CBC: Recent Labs  Lab 06/06/22 0514  WBC 9.6  HGB 10.9*  HCT 37.0*  MCV 88.7  PLT 313    CBG: No results for input(s): "GLUCAP" in the last 168 hours.  Output by Drain (mL) 06/08/22 0701 - 06/08/22 1900 06/08/22 1901 - 06/09/22 0700 06/09/22 0701 - 06/09/22 1900 06/09/22 1901 - 06/10/22 0700 06/10/22 0701 - 06/10/22 1142  Closed  System Drain Right RLQ  14 Fr.          Brief HPI:   Gregory [name] is a 61 y.o.  male right colectomy with findings of positive lymph nodes who presented to emergency department on/20 04/2022 via EMS after experiencing palpitations and a shot discharge from his ICD.  He was placed on amiodarone infusion.  CT scan of the abdomen pelvis performed showed a very large intra-abdominal abscess as well as pseudoaneurysm of splenic artery.  General surgery was consulted who consulted interventional radiology.  Broad-spectrum antibiotics started.  CTA performed and he underwent splenic artery pseudoaneurysm embolization as well as percutaneous drainage of abscess.  History of left upper extremity DVT on Eliquis.   Hospital Course: Gregory [name] was admitted to rehab 05/22/2022 for inpatient therapies to consist of PT, ST and OT at least three hours five days a week. Past admission physiatrist, therapy team and rehab RN have worked together to provide customized collaborative inpatient rehab. Spoke with cardiology service in regards to resuming Entresto.  Blood pressure acceptable and restarted home twice daily dosing. Lasix dose increased to 80 mg BID and potassium supplement 40 mEq provided on 6/11. Patient exhibited anxiety over medical condition and Xanax 0.25 mg ordered prn. Neuropsych consult placed.  Right scapula pain/rhomboid tenderness reported on 6/14. Manual trigger point release. X-ray right shoulder showed severe OA. IR continued to follow patient intermittently and recommend continued drain flushes and if output slows to <10 cc/day, consider repeat CT. LE edema improved and Lasix decreased to 40 mg BID on 6/18. Right upper extremity slightly weaker therefore c-spine films obtained. CT of c-spine completed.  May benefit from EMG as outpatient to further assess upper extremity weakness  Blood pressures were monitored on TID basis and monitored on Entresto and off homeToprol-XL. Lasix 40 mg BID continued for pitting BLE edema>>increased to 80 mg BID. See above.  Diabetes has  been monitored with ac/hs CBG checks and SSI was use prn f during acute inpatient care.  A1c = 5.1 and blood sugars within normal limits therefore sliding scale insulin discontinued.   Rehab course: During patient's stay in rehab weekly team conferences were held to monitor patient's progress, set goals and discuss barriers to discharge. At admission, patient required range of min assist to total assist with ADLs and Min to Mod assist with mobility.  He has had improvement in activity tolerance, balance, postural control as well as ability to compensate for deficits. He has had improvement in functional use RUE/LUE  and RLE/LLE as well as improvement in awareness  Disposition: Home Discharge disposition: 01-Home or Self Care        Diet: regular  Special Instructions:  No driving, alcohol consumption or tobacco use.  Discharge Instructions     Ambulatory referral to Physical Medicine Rehab   Complete by: As directed    Hospital follow-up   Discharge patient   Complete by: As directed    Discharge disposition: 01-Home or Self Care   Discharge patient date: 06/06/2022      Allergies as of 06/06/2022   No Known Allergies      Medication List     STOP taking these medications    clopidogrel 75 MG tablet Commonly known as: PLAVIX   cyclobenzaprine 7.5 MG tablet Commonly known as: FEXMID   oxyCODONE 5 MG immediate release tablet Commonly known as: Oxy IR/ROXICODONE       TAKE these medications    acetaminophen 325 MG tablet Commonly  known as: TYLENOL Take 1-2 tablets (325-650 mg total) by mouth every 4 (four) hours as needed for mild pain. What changed:  medication strength how much to take when to take this reasons to take this   ALPRAZolam 0.25 MG tablet Commonly known as: XANAX Take 1 tablet (0.25 mg total) by mouth 2 (two) times daily as needed for anxiety.   amiodarone 400 MG tablet Commonly known as: PACERONE Take 1 tablet (400 mg total) by mouth  2 (two) times daily.   amoxicillin-clavulanate 875-125 MG tablet Commonly known as: AUGMENTIN Take 1 tablet by mouth every 12 (twelve) hours.   apixaban 5 MG Tabs tablet Commonly known as: Eliquis TAKE 1 TABLET(5 MG) BY MOUTH TWICE DAILY What changed:  how much to take how to take this when to take this additional instructions   atorvastatin 80 MG tablet Commonly known as: LIPITOR Take 1 tablet (80 mg total) by mouth every evening.   dapagliflozin propanediol 10 MG Tabs tablet Commonly known as: Farxiga Take 1 tablet (10 mg total) by mouth daily.   diclofenac Sodium 1 % Gel Commonly known as: VOLTAREN Apply 2 g topically 4 (four) times daily.   docusate sodium 100 MG capsule Commonly known as: COLACE Take 1 capsule (100 mg total) by mouth 2 (two) times daily.   ezetimibe 10 MG tablet Commonly known as: ZETIA Take 1 tablet (10 mg total) by mouth daily.   feeding supplement Liqd Take 237 mLs by mouth 2 (two) times daily between meals.   furosemide 40 MG tablet Commonly known as: LASIX Take 1 tablet (40 mg total) by mouth daily.   levETIRAcetam 500 MG tablet Commonly known as: KEPPRA TAKE 1 TABLET(500 MG) BY MOUTH TWICE DAILY What changed: See the new instructions.   methocarbamol 500 MG tablet Commonly known as: ROBAXIN Take 1 tablet (500 mg total) by mouth every 6 (six) hours as needed for muscle spasms.   metoprolol succinate 25 MG 24 hr tablet Commonly known as: TOPROL-XL Take 1 tablet (25 mg total) by mouth 2 (two) times daily. What changed:  how much to take how to take this when to take this additional instructions   mexiletine 150 MG capsule Commonly known as: MEXITIL Take 1 capsule (150 mg total) by mouth every 8 (eight) hours.   nystatin powder Commonly known as: MYCOSTATIN/NYSTOP Apply topically 2 (two) times daily. Apply To groin area   oxyCODONE-acetaminophen 5-325 MG tablet Commonly known as: PERCOCET/ROXICET Take 1 tablet by mouth  every 6 (six) hours as needed for moderate pain or severe pain.   pantoprazole 40 MG tablet Commonly known as: PROTONIX TAKE 1 TABLET(40 MG) BY MOUTH DAILY What changed:  how much to take how to take this when to take this   polyethylene glycol 17 g packet Commonly known as: MIRALAX / GLYCOLAX Take 17 g by mouth 2 (two) times daily.   potassium chloride 10 MEQ tablet Commonly known as: KLOR-CON M Take 1 tablet (10 mEq total) by mouth daily.   sacubitril-valsartan 49-51 MG Commonly known as: ENTRESTO Take 1 tablet by mouth 2 (two) times daily.   sildenafil 50 MG tablet Commonly known as: Viagra Take 1 tablet (50 mg total) by mouth as needed for erectile dysfunction.   sodium chloride flush 0.9 % Soln injection 5 mLs by Intracatheter route daily for 20 days.   traZODone 50 MG tablet Commonly known as: DESYREL Take 0.5-1 tablets (25-50 mg total) by mouth at bedtime as needed for sleep.  Follow-up Information     Croitoru, [name], MD Follow up.   Specialty: Cardiology Contact information: [address] [place] [address] [phone]         [name], MD .   Specialty: Cardiology Contact information: [phone] [place] [address] [phone]         Mir, [name] Libra, MD Follow up in 10 day(s).   Specialties: Interventional Radiology, Diagnostic Radiology, Radiology Why: Pt will hear from IR scheduler for appointment date/time. Please call [phone] if any questions or concerns. Continue to flush drain daily with 5-10cc and record output. They will also give you instructions regarding follow-up CT scan of abdomen. Contact information: [address] [place] [address] [phone]         Jennye Boroughs, MD Follow up.   Specialty: Physical Medicine and Rehabilitation Why: office will call you to arrange your appt (sent) Contact information: [address] [place] [address] [phone]         Stechschulte, Nickola Major, MD Follow up.   Specialty: Surgery Why: Call on Monday to reschedule hospital follow-up appointment Contact information: [place]. [address] [place] [address] [phone]         Carlyle Basques, MD Follow up.   Specialty: Infectious Diseases Why: Call on Monday for hosptial follow-up appointment Contact information: [place] [address] [address] [phone]         [name], MD. Call.   Specialty: Neurology Why: Call next week to arrange annual follow-up visit (past due in April) Contact information: [place] [address] [phone]                 Signed: Barbie Banner 06/10/2022, 11:42 AM

## 2022-05-23 NOTE — Progress Notes (Signed)
   05/23/22 1050  Clinical Encounter Type  Visited With Patient  Visit Type Initial;Other (Comment) (Advanced Directive)  Referral From Nurse  Consult/Referral To Chaplain   Chaplain responded to a spiritual consult for advanced directive education. The patient, Gregory [name] and I went over the document in detail. Gregory [name] intends to review later today and complete the forms. I advised him to inform the nurse if he has any questions of [place] or when he is ready for [place] to return with the notary. Gregory [name] was appreciative of the information and thanked me for coming.   Danice Goltz [facility]  [phone]

## 2022-05-23 NOTE — Evaluation (Signed)
Physical Therapy Assessment and Plan  Patient Details  Name: Gregory [name] MRN: [id number] Date of Birth: 1961/06/29  PT Diagnosis: Abnormal posture, Abnormality of gait, Coordination disorder, Difficulty walking, and Muscle weakness Rehab Potential: Good ELOS: 14-18 days   Today's Date: 05/23/2022 PT Individual Time: 1105-1200 PT Individual Time Calculation (min): 55 min    Hospital Problem: Principal Problem:   Debility Active Problems:   Postprocedural intraabdominal abscess   Past Medical History:  Past Medical History:  Diagnosis Date   AICD (automatic cardioverter/defibrillator) present 2005   CAD (coronary artery disease) 12/01/2013   Chronic combined systolic and diastolic CHF, NYHA class 1 ([place]) 12/01/2013   Erectile dysfunction 12/01/2013   HTN (hypertension) 12/01/2013   Hyperlipidemia 12/01/2013   Ischemic cardiomyopathy 12/01/2013   Presence of permanent cardiac pacemaker    Sleep apnea    Past Surgical History:  Past Surgical History:  Procedure Laterality Date   ACHILLES TENDON REPAIR     lft foot   BIOPSY  04/16/2022   Procedure: BIOPSY;  Surgeon: Yetta Flock, MD;  Location: [place];  Service: Gastroenterology;;   CARDIAC CATHETERIZATION N/A 05/05/2016   Procedure: Left Heart Cath and Coronary Angiography;  Surgeon: Leonie Man, MD;  Location: [place] [facility];  Service: Cardiovascular;  Laterality: N/A;   CARDIAC DEFIBRILLATOR PLACEMENT     CARDIOVERSION N/A 09/13/2020   Procedure: CARDIOVERSION;  Surgeon: Werner Lean, MD;  Location: [place] [facility];  Service: Cardiovascular;  Laterality: N/A;   CARDIOVERSION N/A 09/19/2020   Procedure: CARDIOVERSION;  Surgeon: [name], MD;  Location: [facility];  Service: Cardiovascular;  Laterality: N/A;   COLON RESECTION N/A 04/18/2022   Procedure: HAND ASSISTED LAPAROSCOPIC RIGHT COLON RESECTION;  Surgeon: Felicie Morn, MD;  Location: [place];  Service: General;   Laterality: N/A;   COLONOSCOPY WITH PROPOFOL N/A 04/16/2022   Procedure: COLONOSCOPY WITH PROPOFOL;  Surgeon: Yetta Flock, MD;  Location: [place];  Service: Gastroenterology;  Laterality: N/A;   EP IMPLANTABLE DEVICE N/A 12/19/2016   Procedure: ICD Generator Changeout;  Surgeon: [name], MD;  Location: [place] [facility];  Service: Cardiovascular;  Laterality: N/A;   ESOPHAGOGASTRODUODENOSCOPY N/A 04/16/2022   Procedure: ESOPHAGOGASTRODUODENOSCOPY (EGD);  Surgeon: Yetta Flock, MD;  Location: [facility] ENDOSCOPY;  Service: Gastroenterology;  Laterality: N/A;   IR ANGIOGRAM SELECTIVE EACH ADDITIONAL VESSEL  05/09/2022   IR ANGIOGRAM VISCERAL SELECTIVE  05/09/2022   IR CATHETER TUBE CHANGE  05/14/2022   IR EMBO ART  VEN HEMORR LYMPH EXTRAV  INC GUIDE ROADMAPPING  05/09/2022   IR IMAGE GUIDED DRAINAGE PERCUT CATH  PERITONEAL RETROPERIT  05/09/2022   IR US GUIDE VASC ACCESS RIGHT  05/09/2022   LEFT HEART CATH AND CORONARY ANGIOGRAPHY N/A 04/17/2022   Procedure: LEFT HEART CATH AND CORONARY ANGIOGRAPHY;  Surgeon: Belva Crome, MD;  Location: [place] [facility];  Service: Cardiovascular;  Laterality: N/A;   POLYPECTOMY  04/16/2022   Procedure: POLYPECTOMY;  Surgeon: Yetta Flock, MD;  Location: [facility] ENDOSCOPY;  Service: Gastroenterology;;   RIGHT/LEFT HEART CATH AND CORONARY ANGIOGRAPHY N/A 09/24/2020   Procedure: RIGHT/LEFT HEART CATH AND CORONARY ANGIOGRAPHY;  Surgeon: Lorretta Harp, MD;  Location: [place] [facility];  Service: Cardiovascular;  Laterality: N/A;   SUBMUCOSAL TATTOO INJECTION  04/16/2022   Procedure: SUBMUCOSAL TATTOO INJECTION;  Surgeon: Yetta Flock, MD;  Location: [facility] ENDOSCOPY;  Service: Gastroenterology;;   WRIST TENODESIS      Assessment & Plan Clinical Impression: Patient  is a 61 year old male with recent diagnosis of colon mass status post right colon resection on Apr 18, 2022.  His history is also significant for ventricular tachycardia status post  AICD in 2006.  He was brought to the emergency department on 5/25 via EMS after experiencing palpitations and shock discharge from his ICD.  Electrophysiology consultation was obtained and he was placed on amiodarone infusion.  CT scan of the abdomen pelvis performed and showed a very large intra-abdominal abscess as well as pseudoaneurysm of the splenic artery.  General surgery was consulted who discussed findings with interventional radiology.  Broad-spectrum antibiotics started.  CT angiogram was performed the following morning.  He underwent percutaneous drainage of the abscess and embolization of the splenic artery pseudoaneurysm on 5/26 by Dr. Dwaine Gale.  Infectious disease consultation was obtained.  Cultures grew E. coli and strep anginosus sensitive to Unasyn.   He continued to have intermittent runs of ventricular tachycardia and electrophysiology continued monitoring.  He has a history of coronary artery disease status post PCI/stent with no anginal symptoms.  Follow-up abdominal CT revealed persistent intra-abdominal abscess and on 5/31, he underwent catheter exchange and repositioning by IR.     During his previous hospitalization, he was found to have left upper extremity DVT in the left basilic vein on 5/8.  Due to upper extremity edema a repeat duplex study was performed on 6/1 with findings of left brachial vein. thrombosis.  Heparin infusion initiated. Transitioned to Eliquis.   He was transitioned to oral amiodarone on 6/2, however he developed recurrent VT and was placed back on amiodarone intravenously.  Mexiletine was initiated.  Able to transition back to amiodarone 400 mg p.o. twice daily on 6/6.  Infectious disease follow-up on 6/7 recommend switching to amoxicillin/clavulanate 875 mg twice daily for 3-4 additional weeks and recommends repeat CT of the abdomen around 6/12.  Percutaneous drain continues.The patient requires inpatient medicine and rehabilitation evaluations and services for  ongoing dysfunction secondary to sepsis, intra-abdominal abscess and recent colon surgery.  Patient transferred to CIR on 05/22/2022 .   Patient currently requires mod with mobility secondary to muscle weakness and muscle joint tightness, decreased cardiorespiratoy endurance and decreased oxygen support, and decreased standing balance and decreased balance strategies.  Prior to hospitalization, patient was independent  with mobility and lived with Son in a House home.  Home access is  Level entry.  Patient will benefit from skilled PT intervention to maximize safe functional mobility, minimize fall risk, and decrease caregiver burden for planned discharge home with 24 hour supervision.  Anticipate patient will benefit from follow up [place] at discharge.  PT - End of Session Activity Tolerance: Tolerates < 10 min activity, no significant change in vital signs Endurance Deficit: Yes Endurance Deficit Description: required seated rest breaks during standing with ADLs PT Assessment Rehab Potential (ACUTE/IP ONLY): Good PT Barriers to Discharge: [place] home environment;Decreased caregiver support;Home environment access/layout;Wound Care;Insurance for SNF coverage;Weight PT Patient demonstrates impairments in the following area(s): Balance;Behavior;Edema;Endurance;Motor;Skin Integrity PT Transfers Functional Problem(s): Bed Mobility;Bed to Chair;Car;Furniture PT Locomotion Functional Problem(s): Ambulation;Wheelchair Mobility;Stairs PT Plan PT Intensity: Minimum of 1-2 x/day ,45 to 90 minutes PT Frequency: 5 out of 7 days PT Duration Estimated Length of Stay: 14-18 days PT Treatment/Interventions: Ambulation/gait training;Discharge planning;Functional mobility training;Psychosocial support;Therapeutic Activities;Balance/vestibular training;Disease management/prevention;Neuromuscular re-education;Skin care/wound management;Therapeutic Exercise;Wheelchair propulsion/positioning;Visual/perceptual  remediation/compensation;Cognitive remediation/compensation;DME/adaptive equipment instruction;Pain management;UE/LE Strength taining/ROM;UE/LE Coordination activities;Stair training;Patient/family education;Community reintegration PT Transfers Anticipated Outcome(s): Supervsion assist with LRAD PT Locomotion Anticipated Outcome(s): Ambulatory supervision assist with LRAD  PT Recommendation Recommendations for Other Services: Therapeutic Recreation consult Therapeutic Recreation Interventions: Outing/community reintergration;Stress management Follow Up Recommendations: Home health PT Equipment Recommended: Rolling walker with 5" wheels;Wheelchair (measurements);Wheelchair cushion (measurements)   PT Evaluation Precautions/Restrictions Precautions Precautions: Fall Precaution Comments: watch O2 and HR, jp drain Restrictions Weight Bearing Restrictions: No General   Vital SignsTherapy Vitals Temp: (!) 97.4 F (36.3 C) Pulse Rate: 83 Resp: 16 BP: 124/81 Patient Position (if appropriate): Lying Oxygen Therapy SpO2: 97 % O2 Device: Room Air Pain Pain Assessment Pain Scale: 0-10 Pain Score: 8  Faces Pain Scale: Hurts a little bit Pain Type: Acute pain Pain Location: Back Pain Orientation: Right;Lower Pain Descriptors / Indicators: Aching Pain Frequency: Constant (with activity) Pain Onset: Gradual Pain Intervention(s): Repositioned PAINAD (Pain Assessment in Advanced Dementia) Breathing: normal Negative Vocalization: none Body Language: relaxed Consolability: no need to console Pain Interference Pain Interference Pain Effect on Sleep: 1. Rarely or not at all Pain Interference with Therapy Activities: 2. Occasionally Pain Interference with Day-to-Day Activities: 2. Occasionally Home Living/Prior Functioning Home Living Living Arrangements: Children Available Help at Discharge: Family;Available PRN/intermittently (son works from home) Type of Home: House Home Access:  Level entry Home Layout: Two level;Bed/bath upstairs Alternate Level Stairs-Number of Steps: 14 Alternate Level Stairs-Rails: Right;Left;Can reach both Bathroom Shower/Tub: Chiropodist: Standard (toilet seat raiser) Bathroom Accessibility: Yes Additional Comments: pt states not needing DME, son assists with driving and errands  Lives With: Son Prior Function Level of Independence: Independent with basic ADLs;Independent with gait  Able to Take Stairs?: Reciprically Driving: Yes 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 Vision/Perception  Perception Perception: Within Functional Limits Praxis Praxis: Intact  Cognition Overall Cognitive Status: Within Functional Limits for tasks assessed Arousal/Alertness: Awake/alert Memory: Appears intact Awareness: Appears intact Problem Solving: Appears intact Safety/Judgment: Appears intact Sensation Sensation Light Touch: Appears Intact Proprioception: Appears Intact Coordination Gross Motor Movements are Fluid and Coordinated: No Fine Motor Movements are Fluid and Coordinated: No Coordination and Movement Description: generalized weakness 2/2 debility and deconditioning Finger Nose Finger Test: Dysmetria bilaterally, limited on LUE 2/2 edema/pain Heel Shin Test: performed in [place]. limited ROm due to strength deficits. Motor  Motor Motor: Other (comment) Motor - Skilled Clinical Observations: generalized deconditioning.   Trunk/Postural Assessment  Cervical Assessment Cervical Assessment: Within Functional Limits Thoracic Assessment Thoracic Assessment: Within Functional Limits Lumbar Assessment Lumbar Assessment: Exceptions to [facility] (posterior pelvic tilt in sitting) Postural Control Postural Control: Within Functional Limits  Balance Balance Balance Assessed: Yes Static Sitting Balance Static Sitting - Balance Support: Feet supported;Bilateral upper extremity supported Static Sitting -  Level of Assistance: 5: Stand by assistance Dynamic Sitting Balance Dynamic Sitting - Balance Support: Feet supported;Left upper extremity supported Dynamic Sitting - Level of Assistance: 5: Stand by assistance Static Standing Balance Static Standing - Balance Support: Bilateral upper extremity supported Static Standing - Level of Assistance: 4: Min assist Dynamic Standing Balance Dynamic Standing - Balance Support: During functional activity;Bilateral upper extremity supported Dynamic Standing - Level of Assistance: 4: Min assist Extremity Assessment      RLE Assessment RLE Assessment: (P) Exceptions to [facility] LLE Assessment LLE Assessment: Exceptions to [facility] General Strength Comments: grossly 4-/5  Care Tool Care Tool Bed Mobility Roll left and right activity   Roll left and right assist level: Moderate Assistance - Patient 50 - 74%    Sit to lying activity   Sit to lying assist level: Moderate Assistance - Patient 50 - 74%    Lying to sitting on  side of bed activity   Lying to sitting on side of bed assist level: the ability to move from lying on the back to sitting on the side of the bed with no back support.: Moderate Assistance - Patient 50 - 74%     Care Tool Transfers Sit to stand transfer   Sit to stand assist level: Moderate Assistance - Patient 50 - 74%    Chair/bed transfer   Chair/bed transfer assist level: Moderate Assistance - Patient 50 - 74%     Toilet transfer   Assist Level: Minimal Assistance - Patient > 75% (simulated, side step)    Scientist, product/process development transfer activity did not occur: Refused        Care Tool Locomotion Ambulation   Assist level: Minimal Assistance - Patient > 75% Assistive device: Walker-rolling Max distance: 25  Walk 10 feet activity   Assist level: Minimal Assistance - Patient > 75% Assistive device: Walker-rolling   Walk 50 feet with 2 turns activity Walk 50 feet with 2 turns activity did not occur: Safety/medical concerns       Walk 150 feet activity Walk 150 feet activity did not occur: Safety/medical concerns      Walk 10 feet on uneven surfaces activity Walk 10 feet on uneven surfaces activity did not occur: Safety/medical concerns      Stairs Stair activity did not occur: Safety/medical concerns        Walk up/down 1 step activity Walk up/down 1 step or curb (drop down) activity did not occur: Safety/medical concerns      Walk up/down 4 steps activity Walk up/down 4 steps activity did not occur: Safety/medical concerns      Walk up/down 12 steps activity Walk up/down 12 steps activity did not occur: Safety/medical concerns      Pick up small objects from floor   Pick up small object from the floor assist level: Total Assistance - Patient < 25%    Wheelchair Is the patient using a wheelchair?: Yes Type of Wheelchair: Manual   Wheelchair assist level: Minimal Assistance - Patient > 75% Max wheelchair distance: 100  Wheel 50 feet with 2 turns activity   Assist Level: Minimal Assistance - Patient > 75%  Wheel 150 feet activity   Assist Level: Moderate Assistance - Patient 50 - 74%    Refer to Care Plan for Long Term Goals  SHORT TERM GOAL WEEK 1 PT Short Term Goal 1 (Week 1): Pt will perform bed mobility with min PT Short Term Goal 2 (Week 1): Pt will transfer to [facility] with CGA PT Short Term Goal 3 (Week 1): Pt will ascend 4steps with min assist PT Short Term Goal 4 (Week 1): Pt will ambulate 31f with min assist PT Short Term Goal 5 (Week 1): Pt will propell WC [id number] supervision assist  Recommendations for other services: Therapeutic Recreation  Stress management and Outing/community reintegration  Skilled Therapeutic Intervention Mobility Bed Mobility Bed Mobility: Supine to Sit;Sit to Supine Right Sidelying to Sit: Maximal Assistance - Patient 25-49% Supine to Sit: Moderate Assistance - Patient 50-74% Sitting - Scoot to Edge of Bed: Minimal Assistance - Patient > 75% Sit to Supine:  Moderate Assistance - Patient 50-74% Transfers Transfers: Sit to Stand;Stand Pivot Transfers Sit to Stand: Minimal Assistance - Patient > 75% Stand Pivot Transfers: Minimal Assistance - Patient > 75% Stand Pivot Transfer Details: Verbal cues for technique Transfer (Assistive device): Rolling walker Locomotion  Gait Ambulation: Yes Gait Assistance: Minimal Assistance - Patient >  75% Gait Distance (Feet): 25 Feet Assistive device: Rolling walker Gait Gait: Yes Gait Pattern: Impaired Gait Pattern: Decreased stride length;Trunk flexed Stairs / Additional Locomotion Stairs: Yes Stairs Assistance: Minimal Assistance - Patient > 75% Stair Management Technique: Two rails Number of Stairs: 1 Height of Stairs: 3 Wheelchair Mobility Wheelchair Mobility: Yes Wheelchair Assistance: Minimal assistance - Patient >75% Wheelchair Propulsion: Both upper extremities Wheelchair Parts Management: Needs assistance Distance: 100  Pt received supine in bed and agreeable to PT. Supine>sit transfer with CGA cues for log roll technique. PT instructed patient in PT Evaluation and initiated treatment intervention; see above for results. PT educated patient in [place], rehab potential, rehab goals, and discharge recommendations along with recommendation for follow-up rehabilitation services. . Pt unable to perform tranfers, gait, or stairs without UE support due to fatigue, muscle weakness, and fear of falling. WC propulsion with min assist x 145f to prevent veer to the right. Pt returned to room and performed stand pivot transfer to bed with RW and CGA. Sit>supine completed with min assist and LE and left supine in bed with call bell in reach and all needs met.      Discharge Criteria: Patient will be discharged from PT if patient refuses treatment 3 consecutive times without medical reason, if treatment goals not met, if there is a change in medical status, if patient makes no progress towards goals or if  patient is discharged from hospital.  The above assessment, treatment plan, treatment alternatives and goals were discussed and mutually agreed upon: by patient  ALorie Phenix6/08/2022, 1:57 PM

## 2022-05-23 NOTE — Progress Notes (Signed)
Inpatient Rehabilitation  Patient information reviewed and entered into eRehab system by Adaline Trejos Ghassan Coggeshall, OTR/L.   Information including medical coding, functional ability and quality indicators will be reviewed and updated through discharge.    

## 2022-05-23 NOTE — Progress Notes (Signed)
Patient noted sensation like palpitations previously experienced. No chest pain or SOB.  VS: 124/81, RR 16 pulse 83  12 lead EKG: SR rate of 81

## 2022-05-23 NOTE — Progress Notes (Signed)
RUQ Percutaneous fluid collection drain placed 5/31 Seen in follow-up today Drain Location: RUQ Size: Fr size: 14 Fr Date of placement: 5/27; exchanged 5/31  Currently to: Drain collection device: suction bulb 24 hour output:  Output by Drain (mL) 05/21/22 0701 - 05/21/22 1900 05/21/22 1901 - 05/22/22 0700 05/22/22 0701 - 05/22/22 1900 05/22/22 1901 - 05/23/22 0700 05/23/22 0701 - 05/23/22 1357  Closed System Drain Right RLQ  14 Fr. 30 85 15 50 175    Interval imaging/drain manipulation:  None  Current examination: Flushes/aspirates easily.  Insertion site unremarkable. Suture and stat lock in place. Dressed appropriately. Site clean, dry, intact. No sign of infection at skin site  Flushed with 5cc NS  Plan: Continue TID flushes with 5 cc NS. Record output Q shift. Dressing changes QD or PRN if soiled.  Call IR APP or on call IR MD if difficulty flushing or sudden change in drain output.  Repeat imaging/possible drain injection once output < 10 mL/QD (excluding flush material.)  Discharge planning: Please contact IR APP or on call IR MD prior to patient d/c to ensure appropriate follow up plans are in place. Typically patient will follow up with IR clinic 10-14 days post d/c for repeat imaging/possible drain injection. IR scheduler will contact patient with date/time of appointment. Patient will need to flush drain QD with 5 cc NS, record output QD, dressing changes every 2-3 days or earlier if soiled.  Will place IR outpatient drain order.  IR will continue to follow - please call with questions or concerns.

## 2022-05-23 NOTE — Progress Notes (Signed)
Physical Therapy Session Note  Patient Details  Name: Gregory [name] MRN: [id number] Date of Birth: 03-23-1961  Today's Date: 05/23/2022 PT Individual Time: 1415-1506 PT Individual Time Calculation (min): 51 min  Missed time: 24 minutes Short Term Goals: Week 1:  PT Short Term Goal 1 (Week 1): Pt will perform bed mobility with min PT Short Term Goal 2 (Week 1): Pt will transfer to [facility] with CGA PT Short Term Goal 3 (Week 1): Pt will ascend 4steps with min assist PT Short Term Goal 4 (Week 1): Pt will ambulate 33f with min assist PT Short Term Goal 5 (Week 1): Pt will propell WC 1024fwith supervision assist  Skilled Therapeutic Interventions/Progress Updates: Pt presents supine in bed and requires encouragement and persuasion to participate w/ therapy.  Pt c/o pain to LB and anxious because cannot get any further pain meds for a few hours per nursing in room.  Pt reluctantly participates w/ therapy.  Pt transfers sup > R sidelying to sit w/ mod A and assist to improve hooklying position.  Pt scoots self to EOB.  Pt very slow tomove.  Pt transfers from elevated bed w/ mod A and verbal cues for sequencing.  Pt amb x 3' to w/c w/ RW and min A but cueing for safe turns to sit.  Pt wheeled to main gym for energy conservation.  Pt performed BERG Balance Test  w/ mod encouragement and constant assist using RW.  Pt scored 15/56 and discussed results and that shows decreased balance and safety.  Discussed therapy program will stress balance, strength but also endurance.  Pt unwilling to continue therapy.  Pt wheeled to hallway outside of room and then amb into room x 15' w/ RW and min A.  Pt required max encouragement to perform 2/2 fatigue and LE weakness, education given about need for therapy to improve.  Pt given cues for sequencing to improve sit to stand transfers, especially forward lean.  Pt returned to supine via reverse log roll w/ mod A.  Pt assisted to re-position in bed including pulling  [facility] on and all needs in reach. Ambulation/gait training;Discharge planning;Functional mobility training;Psychosocial support;Therapeutic Activities;Balance/vestibular training;Disease management/prevention;Neuromuscular re-education;Skin care/wound management;Therapeutic Exercise;Wheelchair propulsion/positioning;Visual/perceptual remediation/compensation;Cognitive remediation/compensation;DME/adaptive equipment instruction;Pain management;UE/LE Strength taining/ROM;UE/LE CoHydrologistraining;Patient/family education;Community reintegration   Therapy Documentation Precautions:  Precautions Precautions: Fall Precaution Comments: watch O2 and HR, jp drain Restrictions Weight Bearing Restrictions: No General: PT Amount of Missed Time (min): 24 Minutes PT Missed Treatment Reason: Patient unwilling to participate;Patient fatigue Vital Signs: Therapy Vitals Temp: (!) 97.4 F (36.3 C) Pulse Rate: 83 Resp: 16 BP: 124/81 Patient Position (if appropriate): Lying Oxygen Therapy SpO2: 97 % O2 Device: Room Air Pain:7/10 Pain Assessment Pain Scale: 0-10 Pain Score: 8  Pain Type: Acute pain Pain Location: Back Pain Orientation: Right;Lower Pain Descriptors / Indicators: Aching Pain Onset: Gradual Pain Intervention(s): Repositioned Mobility: Bed Mobility Bed Mobility: Supine to Sit;Sit to Supine Supine to Sit: Moderate Assistance - Patient 50-74% Sit to Supine: Moderate Assistance - Patient 50-74% Transfers Transfers: Sit to Stand;Stand Pivot Transfers Sit to Stand: Minimal Assistance - Patient > 75% Stand Pivot Transfers: Minimal Assistance - Patient > 75% Transfer (Assistive device): Rolling walker Locomotion : Gait Ambulation: Yes Gait Assistance: Minimal Assistance - Patient > 75% Gait Distance (Feet): 25 Feet Assistive device: Rolling walker Gait Gait: Yes Gait Pattern: Impaired Gait Pattern: Decreased stride length;Trunk flexed Stairs /  Additional Locomotion Stairs: Yes Stairs Assistance: Minimal Assistance - Patient >  75% Stair Management Technique: Two rails Number of Stairs: 1 Height of Stairs: 3 Wheelchair Mobility Wheelchair Mobility: Yes Wheelchair Assistance: Minimal assistance - Patient >75% Wheelchair Propulsion: Both upper extremities Wheelchair Parts Management: Needs assistance Distance: 100  Trunk/Postural Assessment : Cervical Assessment Cervical Assessment: Within Functional Limits Thoracic Assessment Thoracic Assessment: Within Functional Limits Lumbar Assessment Lumbar Assessment: Exceptions to [facility] (posterior pelvic tilt in sitting) Postural Control Postural Control: Within Functional Limits  Balance: Balance Balance Assessed: Yes Standardized Balance Assessment Standardized Balance Assessment: Berg Balance Test Berg Balance Test Sit to Stand: Needs minimal aid to stand or to stabilize Standing Unsupported: Unable to stand 30 seconds unassisted Sitting with Back Unsupported but Feet Supported on Floor or Stool: Able to sit safely and securely 2 minutes Stand to Sit: Controls descent by using hands Transfers: Needs one person to assist Standing Unsupported with Eyes Closed: Able to stand 3 seconds Standing Ubsupported with Feet Together: Able to place feet together independently but unable to hold for 30 seconds From Standing, Reach Forward with Outstretched Arm: Loses balance while trying/requires external support From Standing Position, Pick up Object from Floor: Unable to try/needs assist to keep balance From Standing Position, Turn to Look Behind Over each Shoulder: Needs assist to keep from losing balance and falling Turn 360 Degrees: Needs assistance while turning Standing Unsupported, Alternately Place Feet on Step/Stool: Able to complete >2 steps/needs minimal assist Standing Unsupported, One Foot in Front: Needs help to step but can hold 15 seconds Standing on One Leg: Unable to try  or needs assist to prevent fall Total Score: 15 Static Sitting Balance Static Sitting - Balance Support: Feet supported;Bilateral upper extremity supported Static Sitting - Level of Assistance: 5: Stand by assistance Dynamic Sitting Balance Dynamic Sitting - Balance Support: Feet supported;Left upper extremity supported Dynamic Sitting - Level of Assistance: 5: Stand by assistance Static Standing Balance Static Standing - Balance Support: Bilateral upper extremity supported Static Standing - Level of Assistance: 4: Min assist Dynamic Standing Balance Dynamic Standing - Balance Support: During functional activity;Bilateral upper extremity supported Dynamic Standing - Level of Assistance: 4: Min assist Exercises:   Other Treatments:      Therapy/Group: Individual Therapy  Ladoris Gene 05/23/2022, 3:46 PM

## 2022-05-23 NOTE — Progress Notes (Signed)
PROGRESS NOTE   Subjective/Complaints: Pt asking about his BP medications this AM.  No additional concerns.   Review of Systems  Constitutional:  Negative for chills and fever.  Respiratory:  Negative for shortness of breath.   Cardiovascular:  Negative for chest pain.  Genitourinary:  Negative for dysuria.  Neurological:  Positive for weakness. Negative for dizziness and headaches.    Objective:   No results found. Recent Labs    05/22/22 1123 05/23/22 0608  WBC 11.1* 8.1  HGB 10.7* 10.0*  HCT 35.5* 34.3*  PLT 329 316   Recent Labs    05/22/22 1123 05/23/22 0608  NA 136 136  K 3.9 3.6  CL 101 101  CO2 28 28  GLUCOSE 92 82  BUN 11 11  CREATININE 0.75 0.85  CALCIUM 7.6* 8.0*    Intake/Output Summary (Last 24 hours) at 05/23/2022 0846 Last data filed at 05/23/2022 0830 Gross per 24 hour  Intake 240 ml  Output 1025 ml  Net -785 ml        Physical Exam: Vital Signs Blood pressure (!) 159/102, pulse 79, temperature (!) 97.4 F (36.3 C), temperature source Oral, resp. rate 20, height '6\' 6"'$  (1.981 m), weight 107.7 kg, SpO2 94 %.  Physical Exam  Constitutional:      General: He is not in acute distress.    Comments alert, appropriate, NAD  HENT:     Head: Normocephalic and atraumatic.     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     Pharynx: Oropharynx is clear. No oropharyngeal exudate.  Cardiovascular:     Rate and Rhythm: Normal rate and regular rhythm.     Heart sounds: Normal heart sounds. No murmur heard.    No gallop.  Pulmonary:     Effort: Pulmonary effort is normal. No respiratory distress.     Breath sounds: Normal breath sounds. No wheezing, rhonchi or rales.  Abdominal:     Comments: + bowel sounds NT; bandage on R side of abd with JP drain draining small amount of purulent  drainage Musculoskeletal:     Cervical back: Neck supple. No tenderness.     Right lower leg: Edema present.     Left lower leg: Edema present.     Comments: L arm forearm to mid upper arm nonpitting edema 2+ B/L LE's 3+ pitting edema to knees B/L 5-/5 throughout except LLE 4+/5  Skin:    General: Skin is warm and dry.     Comments: Small area of superficial breakdown left sacral area Skin tear of R buttock- nickel sized R forearm reddened-  from previous IV Neurological:     Mental Status: He is oriented to person, place, and time.     Comments: Intact to light touch in all 4 extremities  Psychiatric:        Mood and Affect: Mood normal.        Behavior: Behavior normal.   Assessment/Plan: 1. Functional deficits which require 3+ hours per day of interdisciplinary therapy in a comprehensive inpatient rehab setting. Physiatrist is  providing close team supervision and 24 hour management of active medical problems listed below. Physiatrist and rehab team continue to assess barriers to discharge/monitor patient progress toward functional and medical goals  Care Tool:  Bathing              Bathing assist       Upper Body Dressing/Undressing Upper body dressing        Upper body assist      Lower Body Dressing/Undressing Lower body dressing            Lower body assist       Toileting Toileting    Toileting assist Assist for toileting: Minimal Assistance - Patient > 75%     Transfers Chair/bed transfer  Transfers assist           Locomotion Ambulation   Ambulation assist              Walk 10 feet activity   Assist           Walk 50 feet activity   Assist           Walk 150 feet activity   Assist           Walk 10 feet on uneven surface  activity   Assist           Wheelchair     Assist               Wheelchair 50 feet with 2 turns activity    Assist            Wheelchair 150 feet  activity     Assist          Blood pressure (!) 159/102, pulse 79, temperature (!) 97.4 F (36.3 C), temperature source Oral, resp. rate 20, height '6\' 6"'$  (1.981 m), weight 107.7 kg, SpO2 94 %.       Medical Problem List and Plan: 1. Functional deficits secondary to sepsis, intra-abdominal abscess status post percutaneous drain and recent colon surgery             -patient may  shower if covers JP drain site             -ELOS/Goals: 10-12 days mod I to intermittent supervision  -CIR, PT/OT evaluations 2.  Antithrombotics: -DVT/anticoagulation:  Pharmaceutical: Other (comment) Eliquis 5 mg twice daily             -antiplatelet therapy: None 3. Pain Management: Tylenol, Percocet as needed 4. Mood: LCSW to evaluate and provide emotional support             -antipsychotic agents: n/a 5. Neuropsych: This patient is capable of making decisions on his own behalf. 6. Skin/Wound Care: Routine skin care checks             --routine drain care 7. Fluids/Electrolytes/Nutrition: Routine ins and outs and follow-up chemistries 8: Intraabdominal abscess s/p perc drain placement- pt knows might go home with drain             --plan Augmentin for 3-4 weeks per ID  -Drain was placed 5/27, exchanged 5/31 by IR             --plan follow-up CT 6/12             --continue monitoring drain output; follow-up IR drain clinic 9: Colon cancer s/p right hemicolectomy 5/5 with mets to lymph nodes             --follow-up with  Dr. Burr Medico; PET scan pending- outpatient? 10: CAD/prior PCI to LAD             --cath on 5/4-stable continue medical management             --continue Plavix and aspirin  -Denies chest pain 11: Recurrent VT/prior ACID: continue mexiletine and amiodorone             --keep K+ > 4.0; Mg > 2.0             --follow-up with Dr. Caryl Comes 12: Chronic sys and dia HF: continue [place]; Lasix              --daily weight             --follow-up Dr. Sallyanne Kuster  -Continue lasix '40mg'$  BID 13:  Paroxysmal AF: continue Eliquis 14: Left brachial DVT: continue Eliquis 15: Hypothyroidism; continue Synthroid 16: Hypertension: takes metoprolol and entresto at home.   -He was restarted on entresto, monitor BP, was discussed with cardiology prior to starting 17: Seizure disorder: no recent seizure activity; continue Keppra 18: OSA: continue CPAP 19: DM: has been on regular diet without need for insulin coverage; A1c = 5.1             --d/c SSI 20: Hyperlipidemia: continue Zetia, Lipitor 21. Cellulitis? R forearm- already on Unasyn- should cover- will monitor 22. Anemia  -5/9 HGB 10.0, continue to monitor  23. Hypoalbuminemia  -Appears to be eating more of his meals, discussed protein intake, continue to follow    LOS: 1 days A FACE TO FACE EVALUATION WAS PERFORMED  Jennye Boroughs 05/23/2022, 8:46 AM

## 2022-05-23 NOTE — Progress Notes (Signed)
[facility] Individual Statement of Services  Patient Name:  Gregory [name]  Date:  05/23/2022  Welcome to the [facility].  Our goal is to provide you with an individualized program based on your diagnosis and situation, designed to meet your specific needs.  With this comprehensive rehabilitation program, you will be expected to participate in at least 3 hours of rehabilitation therapies Monday-Friday, with modified therapy programming on the weekends.  Your rehabilitation program will include the following services:  Physical Therapy (PT), Occupational Therapy (OT), Therapeutic Recreaction (TR), Care Coordinator, Rehabilitation Medicine, Nutrition Services, and Pharmacy Services  Weekly team conferences will be held on Wednesday to discuss your progress.  Your Inpatient Rehabilitation Care Coordinator will talk with you frequently to get your input and to update you on team discussions.  Team conferences with you and your family in attendance may also be held.  Expected length of stay: 14-18 days  Overall anticipated outcome: supervision-mod/I level  Depending on your progress and recovery, your program may change. Your Inpatient Rehabilitation Care Coordinator will coordinate services and will keep you informed of any changes. Your Inpatient Rehabilitation Care Coordinator's name and contact numbers are listed  below.  The following services may also be recommended but are not provided by the [facility] will be made to provide these services after discharge if needed.  Arrangements include referral to agencies that provide these services.  Your insurance has been verified to be:  [employer] Your primary doctor is:  Pharmacist, hospital  Pertinent information will be shared with your doctor and [employer].  Inpatient Rehabilitation Care Coordinator:  Ovidio Kin, [place] or Emilia Beck  Information discussed with and copy given to patient by: Elease Hashimoto, 05/23/2022, 11:44 AM

## 2022-05-24 DIAGNOSIS — R5381 Other malaise: Secondary | ICD-10-CM | POA: Diagnosis not present

## 2022-05-24 NOTE — Progress Notes (Signed)
Physical Therapy Session Note  Patient Details  Name: Gregory [name] MRN: [id number] Date of Birth: 01/04/1961  Today's Date: 05/24/2022 PT Individual Time: [phone] and 1093-2355 PT Individual Time Calculation (min): 58 min   Short Term Goals: Week 1:  PT Short Term Goal 1 (Week 1): Pt will perform bed mobility with min PT Short Term Goal 2 (Week 1): Pt will transfer to [facility] with CGA PT Short Term Goal 3 (Week 1): Pt will ascend 4steps with min assist PT Short Term Goal 4 (Week 1): Pt will ambulate 34f with min assist PT Short Term Goal 5 (Week 1): Pt will propell WC 1076fwith supervision assist  Skilled Therapeutic Interventions/Progress Updates: Pt presented in bed agreeable to therapy. Pt states pain 3/10 in low back, nsg arrived to provide am meds including pain meds with rest breaks provided as needed throughout session. Pt participated in supine therex prior to OOB including ankle pumps, QS, hip abd/add, and heel slides x 10-15 bilaterally.  Pt performed supine to sit with minA and heavy use of bed features. Performed stand pivot with bed heavily elevated with CGA and minA to transfer with RW to w/c. Pt moved to sink and performed oral hygiene with set up. Pt transported to rehab gym for time management and energy conservation and participated in seated therex with 3# ankle cuffs including LAQ and seated marches 2 x 10 bilaterally. Pt performed Sit to stand x 5 from w/c relying primarily on BUE to push up from w/c and decreased eccentric control when returning to sitting.  Pt then propelled w/c ~4544fith supervision and increased time/effort. Provided cues to increase shoulder extension to improve efficiency of propulsion however limited carryover. Pt transported remaining distance back to room and performed ambulatory transfer to bed with RW and CGA. Pt required minA for sit to supine for BLE management. Pt left in care of Courtney and NP to check drain with bed alarm on and  current needs met.   Tx2: Pt presented in bed agreeable to therapy. Pt states pain 8/10 in low back , rest breaks provided as needed. Performed supine to sit with modA for truncal support. Performed step pivot transfer to w/c with CGA for stand and transfer itself. Pt transported to day room and participated in ambulation for endurance ~71f32fth RW and CGA with w/c follow for safety. At high low mat pt participated in standing tolerance playing horseshoes. Performed ambulatory transfer back to w/c and transported pt outside for therapeutic activity. Pt appreciative for change of scenery and fresh air. Pt transported back to room and performed ambulatory transfer w/c to bed ~5ft 73fh RW and CGA overall. Pt required modA sit to supine for BLE management. Pt able to reposition self to comfort and left with bed alarm on, call bell within reach and needs met.      Therapy Documentation Precautions:  Precautions Precautions: Fall Precaution Comments: watch O2 and HR, jp drain Restrictions Weight Bearing Restrictions: No General:   Vital Signs:   Pain: Pain Assessment Pain Score: 0-No pain Mobility:   Locomotion :    Trunk/Postural Assessment :    Balance:   Exercises:   Other Treatments:      Therapy/Group: Individual Therapy  Roger Fasnacht 05/24/2022, 12:58 PM

## 2022-05-24 NOTE — Plan of Care (Signed)
  Problem: RH Balance Goal: LTG Patient will maintain dynamic sitting balance (PT) Description: LTG:  Patient will maintain dynamic sitting balance with assistance during mobility activities (PT) Flowsheets (Taken 05/24/2022 0750) LTG: Pt will maintain dynamic sitting balance during mobility activities with:: Independent Goal: LTG Patient will maintain dynamic standing balance (PT) Description: LTG:  Patient will maintain dynamic standing balance with assistance during mobility activities (PT) Flowsheets (Taken 05/24/2022 0750) LTG: Pt will maintain dynamic standing balance during mobility activities with:: Supervision/Verbal cueing   Problem: RH Bed Mobility Goal: LTG Patient will perform bed mobility with assist (PT) Description: LTG: Patient will perform bed mobility with assistance, with/without cues (PT). Flowsheets (Taken 05/24/2022 0750) LTG: Pt will perform bed mobility with assistance level of: Independent with assistive device    Problem: RH Bed to Chair Transfers Goal: LTG Patient will perform bed/chair transfers w/assist (PT) Description: LTG: Patient will perform bed to chair transfers with assistance (PT). Flowsheets (Taken 05/24/2022 0750) LTG: Pt will perform Bed to Chair Transfers with assistance level: Independent with assistive device    Problem: RH Car Transfers Goal: LTG Patient will perform car transfers with assist (PT) Description: LTG: Patient will perform car transfers with assistance (PT). Flowsheets (Taken 05/24/2022 0750) LTG: Pt will perform car transfers with assist:: Supervision/Verbal cueing   Problem: RH Ambulation Goal: LTG Patient will ambulate in controlled environment (PT) Description: LTG: Patient will ambulate in a controlled environment, # of feet with assistance (PT). Flowsheets (Taken 05/24/2022 0750) LTG: Pt will ambulate in controlled environ  assist needed:: Supervision/Verbal cueing LTG: Ambulation distance in controlled environment: 171f  with LRAD Goal: LTG Patient will ambulate in home environment (PT) Description: LTG: Patient will ambulate in home environment, # of feet with assistance (PT). Flowsheets (Taken 05/24/2022 0750) LTG: Pt will ambulate in home environ  assist needed:: Supervision/Verbal cueing LTG: Ambulation distance in home environment: 584fwith LRAD   Problem: RH Wheelchair Mobility Goal: LTG Patient will propel w/c in controlled environment (PT) Description: LTG: Patient will propel wheelchair in controlled environment, # of feet with assist (PT) Flowsheets (Taken 05/24/2022 0750) LTG: Pt will propel w/c in controlled environ  assist needed:: Supervision/Verbal cueing LTG: Propel w/c distance in controlled environment: 15043f Problem: RH Stairs Goal: LTG Patient will ambulate up and down stairs w/assist (PT) Description: LTG: Patient will ambulate up and down # of stairs with assistance (PT) Flowsheets (Taken 05/24/2022 0750) LTG: Pt will ambulate up/down stairs assist needed:: Contact Guard/Touching assist LTG: Pt will  ambulate up and down number of stairs: 15 steps to access bed/bath at home

## 2022-05-24 NOTE — Progress Notes (Signed)
Notified on call Luetta Nutting NP) for patient request to have CPAP.new  order placed Resp.Therapist notified and on department,made aware of new order will assess and provide

## 2022-05-24 NOTE — Progress Notes (Addendum)
JP drained Q 4 hours and is draining milky white drainage.  165 ml output total for the night shift. Pt slept well and no distress overnight.

## 2022-05-24 NOTE — Progress Notes (Signed)
Occupational Therapy Session Note  Patient Details  Name: Gregory [name] MRN: [id number] Date of Birth: Mar 05, 1961  Today's Date: 05/24/2022 OT Individual Time: 7829-5621 & 1445-1515 OT Individual Time Calculation (min): 60 min & 30 min   Short Term Goals: Week 1:  OT Short Term Goal 1 (Week 1): Pt will donn LB with AE PRN with mod A OT Short Term Goal 2 (Week 1): Pt will tolerate stance position for >2 mins to promote standing endurance needed for self-care tasks OT Short Term Goal 3 (Week 1): Pt will complete short ambulatory transfer to toilet with min A using LRAD  Therapy Documentation Precautions:  Precautions Precautions: Fall Precaution Comments: watch O2 and HR, jp drain Restrictions Weight Bearing Restrictions: No General:  Session 1:  Upon OT arrival, pt semi recumbent in bed and reports 8/10 pain in the lower back. Pt reports he just received pain medication. Pt's L UE noted to be less edematous. Pt agreeable to OT session. Pt completed functional bed mobility including supine to sit transfer with SBA and transferred into w/c with Mod A with RW. Pt requires verbal cues for safe hand placement during transfers. Therapist transported pt to therapy gym to conserve energy. Pt positioned infornt of adjustable table and completes sit > stand transfer with RW and Min A. Pt stands for ~10 minutes total with one longer seated rest break to flip over and sort quirkle game pieces based on shape onto tabletop with R UE only. L UE used to stabilize. Pt able to sort 90% of game piece but finished sorting seated in w/c. While seated, pt used the L UE to cross midline and retrieve quirkle game pieces one at a time and return to the game piece bag located to L of pt to improve endurance and UE strength. Pt requires increased time and occasional rest breaks to return all game pieces to the bag. Pt requires min verbal cues to no toss game pieces into the bag. Pt requires education on  purpose of tasks as well. Pt was returned to his room via w/c and completed stand pivot transfer to bed with Min A with RW. Mod A required for sit to supine transfer and was repositioned. All safety measures in place.     Session 2: Upon OT arrival, pt semi recumbent in bed and reports 8/10 pain in the lower back. Pt agreeable to OT session and requesting to shave. Cleared shaving with RN. Pt completes functional bed mobility with HOB mildly elevated with SBA. Pt sits EOB to engage in AE training for LB dressing. Therapist provided verbal and visual demonstration and pt demonstrates understanding. Pt able to doff/donn socks with Supervision. Pt completes stand step transfer from bed to w/c with RW and Min A. Pt sits sinkside to wash face, shave, and brush teeth with Setup. Pt demonstrates no difficulty and tolerates well. Pt completes chair to bed transfer with RW and CGA and returns to supine with Min A. Pt making progress towards stated OT goals and continues to benefit from OT services to achieve highest level of function. .    Therapy/Group: Individual Therapy  Marvetta Gibbons 05/24/2022, 1:35 PM

## 2022-05-24 NOTE — Progress Notes (Signed)
PROGRESS NOTE   Subjective/Complaints: Patient seen at bedside after coming back from PT.  No complaints from patient but nursing staff has questions about flushing JP drain.  Encouraged nursing to call on-call for IR for directions.  ROS: Negative for SOB, CP, dizziness, N/V/C/D.  Positive for weakness.    Objective:   No results found. Recent Labs    05/22/22 1123 05/23/22 0608  WBC 11.1* 8.1  HGB 10.7* 10.0*  HCT 35.5* 34.3*  PLT 329 316   Recent Labs    05/22/22 1123 05/23/22 0608  NA 136 136  K 3.9 3.6  CL 101 101  CO2 28 28  GLUCOSE 92 82  BUN 11 11  CREATININE 0.75 0.85  CALCIUM 7.6* 8.0*    Intake/Output Summary (Last 24 hours) at 05/24/2022 1702 Last data filed at 05/24/2022 1537 Gross per 24 hour  Intake 670 ml  Output 2185 ml  Net -1515 ml        Physical Exam: Vital Signs Blood pressure 122/89, pulse 77, temperature 98.8 F (37.1 C), temperature source Oral, resp. rate 18, height '6\' 6"'$  (1.981 m), weight 102.3 kg, SpO2 97 %.  Physical Exam    General: Alert and oriented x 3, No apparent distress HEENT: Head is normocephalic, atraumatic, PERRLA, EOMI bilateral, sclera anicteric, oral mucosa pink and moist, dentition intact, ext ear canals clear,  Note: Both eyes have chronic fatty deposits on lateral aspects of eye not interfering with vision.  Left > Right. Neck: Supple without JVD or lymphadenopathy Heart: Reg rate and rhythm. No murmurs rubs or gallops Chest: CTA bilaterally without wheezes, rales, or rhonchi; no distress Abdomen: Soft, non-tender, non-distended, bowel sounds positive. Has bandage on R side of abd with JP drain, with small amount of purulent drainage. Extremities: No clubbing, cyanosis, or edema. Pulses are 2+ Psych: Pt's affect is appropriate. Pt is cooperative Skin: Warm and dry. Small area superficial skin breakdown left sacral area.  Skin tear R buttock, nickel  sized. Rt forearm reddend from previous IV. Neuro: Alert and oriented x3, CN II-XII intact, no focal deficits. Sensory: Intact to LT all 4 extremities Musculoskeletal: Neck supple, cervical back supple. No tenderness. Rt lower leg: Edema present, +2, pitting Lt lower leg: Edema present. +3, pitting Lt arm forearm to mid-upper arm non-pitting 2+ bilateral LE's 3+ pitting edema to knees bilateral. Motor: 5-/5 throughout except LLE 4+/5   Assessment/Plan: 1. Functional deficits which require 3+ hours per day of interdisciplinary therapy in a comprehensive inpatient rehab setting. Physiatrist is providing close team supervision and 24 hour management of active medical problems listed below. Physiatrist and rehab team continue to assess barriers to discharge/monitor patient progress toward functional and medical goals  Care Tool:  Bathing    Body parts bathed by patient: Right arm, Left arm, Chest, Abdomen, Right upper leg, Left upper leg, Face   Body parts bathed by helper: Front perineal area, Buttocks, Right lower leg, Left lower leg     Bathing assist Assist Level: Moderate Assistance - Patient 50 - 74%     Upper Body Dressing/Undressing Upper body dressing   What is the patient wearing?: Pull over shirt    Upper  body assist Assist Level: Minimal Assistance - Patient > 75%    Lower Body Dressing/Undressing Lower body dressing      What is the patient wearing?: Underwear/pull up     Lower body assist Assist for lower body dressing: Maximal Assistance - Patient 25 - 49%     Toileting Toileting    Toileting assist Assist for toileting: Moderate Assistance - Patient 50 - 74%     Transfers Chair/bed transfer  Transfers assist     Chair/bed transfer assist level: Moderate Assistance - Patient 50 - 74%     Locomotion Ambulation   Ambulation assist      Assist level: Minimal Assistance - Patient > 75% Assistive device: Walker-rolling Max distance: 15    Walk 10 feet activity   Assist     Assist level: Minimal Assistance - Patient > 75% Assistive device: Walker-rolling   Walk 50 feet activity   Assist Walk 50 feet with 2 turns activity did not occur: Safety/medical concerns         Walk 150 feet activity   Assist Walk 150 feet activity did not occur: Safety/medical concerns         Walk 10 feet on uneven surface  activity   Assist Walk 10 feet on uneven surfaces activity did not occur: Safety/medical concerns         Wheelchair     Assist Is the patient using a wheelchair?: Yes Type of Wheelchair: Manual    Wheelchair assist level: Minimal Assistance - Patient > 75% Max wheelchair distance: 100    Wheelchair 50 feet with 2 turns activity    Assist        Assist Level: Minimal Assistance - Patient > 75%   Wheelchair 150 feet activity     Assist      Assist Level: Moderate Assistance - Patient 50 - 74%   Blood pressure 122/89, pulse 77, temperature 98.8 F (37.1 C), temperature source Oral, resp. rate 18, height '6\' 6"'$  (1.981 m), weight 102.3 kg, SpO2 97 %.  Medical Problem List and Plan: 1. Functional deficits secondary to sepsis, intra-abdominal abscess status post percutaneous drain and recent colon surgery             -patient may  shower if covers JP drain site             -ELOS/Goals: 10-12 days mod I to intermittent supervision             -CIR, PT/OT evaluations 2.  Antithrombotics: -DVT/anticoagulation:  Pharmaceutical: Other (comment) Eliquis 5 mg twice daily             -antiplatelet therapy: None 3. Pain Management: Tylenol, Percocet as needed 4. Mood: LCSW to evaluate and provide emotional support             -antipsychotic agents: n/a 5. Neuropsych: This patient is capable of making decisions on his own behalf. 6. Skin/Wound Care: Routine skin care checks             --routine drain care 7. Fluids/Electrolytes/Nutrition: Routine ins and outs and follow-up  chemistries 8: Intraabdominal abscess s/p perc drain placement- pt knows might go home with drain             --plan Augmentin for 3-4 weeks per ID             -Drain was placed 5/27, exchanged 5/31 by IR             --plan follow-up CT  6/12             --continue monitoring drain output; follow-up IR drain clinic 9: Colon cancer s/p right hemicolectomy 5/5 with mets to lymph nodes             --follow-up with Dr. Burr Medico; PET scan pending- outpatient? 10: CAD/prior PCI to LAD             --cath on 5/4-stable continue medical management             --continue Plavix and aspirin             -Denies chest pain 11: Recurrent VT/prior ACID: continue mexiletine and amiodorone             --keep K+ > 4.0; Mg > 2.0             --follow-up with Dr. Caryl Comes 12: Chronic sys and dia HF: continue [place]; Lasix              --daily weight             --follow-up Dr. Sallyanne Kuster             -Continue lasix '40mg'$  BID 13: Paroxysmal AF: continue Eliquis 14: Left brachial DVT: continue Eliquis 15: Hypothyroidism; continue Synthroid 16: Hypertension: takes metoprolol and entresto at home.              -He was restarted on entresto, monitor BP, was discussed with cardiology prior to starting 6/10 Entresto started on 6/9 bp well controlled    05/24/2022    3:34 PM 05/24/2022    5:00 AM 05/24/2022    3:04 AM  Vitals with BMI  Weight  225 lbs 8 oz   BMI  03.00   Systolic 923  300  Diastolic 89  84  Pulse 77  81    17: Seizure disorder: no recent seizure activity; continue Keppra 18: OSA: continue CPAP 19: DM: has been on regular diet without need for insulin coverage; A1c = 5.1             --d/c SSI 20: Hyperlipidemia: continue Zetia, Lipitor 21. Cellulitis? R forearm- already on Unasyn- should cover- will monitor 6/10 continue to monitor R forearm, already on Unasyn 22. Anemia             -5/9 HGB 10.0, continue to monitor  6/10 Trend with qMonday labs. 23. Hypoalbuminemia             -Appears to be  eating more of his meals, discussed protein intake, continue to follow   LOS: 2 days A FACE TO FACE EVALUATION WAS PERFORMED  Luetta Nutting 05/24/2022, 5:02 PM

## 2022-05-25 DIAGNOSIS — R5381 Other malaise: Secondary | ICD-10-CM | POA: Diagnosis not present

## 2022-05-25 MED ORDER — POTASSIUM CHLORIDE CRYS ER 20 MEQ PO TBCR
20.0000 meq | EXTENDED_RELEASE_TABLET | Freq: Every day | ORAL | Status: DC
  Administered 2022-05-25 – 2022-06-05 (×12): 20 meq via ORAL
  Filled 2022-05-25 (×13): qty 1

## 2022-05-25 MED ORDER — FUROSEMIDE 40 MG PO TABS
80.0000 mg | ORAL_TABLET | Freq: Two times a day (BID) | ORAL | Status: DC
  Administered 2022-05-25 – 2022-06-01 (×14): 80 mg via ORAL
  Filled 2022-05-25 (×14): qty 2

## 2022-05-25 MED ORDER — ALPRAZOLAM 0.25 MG PO TABS
0.2500 mg | ORAL_TABLET | Freq: Three times a day (TID) | ORAL | Status: DC | PRN
  Administered 2022-05-31 – 2022-06-05 (×13): 0.25 mg via ORAL
  Filled 2022-05-25 (×13): qty 1

## 2022-05-25 NOTE — Progress Notes (Signed)
RT called to bedside about CPAP. Pt states he wants to try the nasal mask because his mouth being dry. I explained to patient if he is ordered full face mask at home, the nasal mask will not work because the air will just escape through his mouth. He stated he had tried nasal mask before but he drooled a lot. I explained which meant his mouth came open during his sleep and air escaped through there and would not make it to airway like it is supposed to. I asked pt was anybody able to bring his machine in due to [place] not having capability of humidification anymore. He states he will have someone bring his in tomorrow and will wear our machine tonight and will place on himself when ready.

## 2022-05-25 NOTE — Progress Notes (Addendum)
PROGRESS NOTE   Subjective/Complaints: Patient seen at bedside with patient laying in bed. Reports by pt and nurse that his legs were "swollen and taut" last night, but there has been some reduction in swelling this morning. Patient is also expressing some anxiety regarding medical diagnosis.  Per nursing may need Neuropsych. evaluation.  ROS: Negative for SOB, CP, dizziness, N/V/C/D.  Positive for bilateral lower leg swelling. Positive for anxiety.    Objective:   No results found. Recent Labs    05/23/22 0608  WBC 8.1  HGB 10.0*  HCT 34.3*  PLT 316   Recent Labs    05/23/22 0608  NA 136  K 3.6  CL 101  CO2 28  GLUCOSE 82  BUN 11  CREATININE 0.85  CALCIUM 8.0*    Intake/Output Summary (Last 24 hours) at 05/25/2022 1342 Last data filed at 05/25/2022 1100 Gross per 24 hour  Intake 5 ml  Output 2145 ml  Net -2140 ml        Physical Exam: Vital Signs Blood pressure 136/85, pulse 75, temperature 98.2 F (36.8 C), temperature source Oral, resp. rate 18, height '6\' 6"'$  (1.981 m), weight 102.7 kg, SpO2 96 %.  Physical Exam    General: Alert and oriented x 3, No apparent distress HEENT: Head is normocephalic, atraumatic, PERRLA, EOMI bilateral, sclera anicteric, oral mucosa pink and moist, dentition intact, ext ear canals clear,  Note: Both eyes have chronic fatty deposits on lateral aspects of eye not interfering with vision.  Left > Right. Neck: Supple without JVD or lymphadenopathy Heart: Reg rate and rhythm. No murmurs rubs or gallops Chest: CTA bilaterally without wheezes, rales, or rhonchi; no distress Abdomen: Soft, non-tender, non-distended, bowel sounds positive. Has bandage on R side of abd with JP drain, with small amount of purulent drainage. Extremities: No clubbing, cyanosis, or edema. Pulses are 2+ Psych: Pt's affect is appropriate. Pt is cooperative Skin: Warm and dry. Small area superficial skin  breakdown left sacral area.  Skin tear R buttock, nickel sized. Rt forearm reddend from previous IV. Neuro: Alert and oriented x3, CN II-XII intact, no focal deficits. Sensory: Intact to LT all 4 extremities Musculoskeletal: Neck supple, cervical back supple. No tenderness. Rt lower leg: Edema present, +2, pitting Lt lower leg: Edema present. +2, pitting Lt arm forearm to mid-upper arm non-pitting 2+ bilateral LE's 2+ pitting edema to knees bilateral. Motor: 5-/5 throughout except LLE 4+/5   Assessment/Plan: 1. Functional deficits which require 3+ hours per day of interdisciplinary therapy in a comprehensive inpatient rehab setting. Physiatrist is providing close team supervision and 24 hour management of active medical problems listed below. Physiatrist and rehab team continue to assess barriers to discharge/monitor patient progress toward functional and medical goals  Care Tool:  Bathing    Body parts bathed by patient: Right arm, Left arm, Chest, Abdomen, Right upper leg, Left upper leg, Face   Body parts bathed by helper: Front perineal area, Buttocks, Right lower leg, Left lower leg     Bathing assist Assist Level: Moderate Assistance - Patient 50 - 74%     Upper Body Dressing/Undressing Upper body dressing   What is the patient wearing?: Pull  over shirt    Upper body assist Assist Level: Moderate Assistance - Patient 50 - 74%    Lower Body Dressing/Undressing Lower body dressing      What is the patient wearing?: Underwear/pull up     Lower body assist Assist for lower body dressing: Maximal Assistance - Patient 25 - 49%     Toileting Toileting    Toileting assist Assist for toileting: Moderate Assistance - Patient 50 - 74%     Transfers Chair/bed transfer  Transfers assist     Chair/bed transfer assist level: Moderate Assistance - Patient 50 - 74%     Locomotion Ambulation   Ambulation assist      Assist level: Minimal Assistance - Patient >  75% Assistive device: Walker-rolling Max distance: 15   Walk 10 feet activity   Assist     Assist level: Minimal Assistance - Patient > 75% Assistive device: Walker-rolling   Walk 50 feet activity   Assist Walk 50 feet with 2 turns activity did not occur: Safety/medical concerns         Walk 150 feet activity   Assist Walk 150 feet activity did not occur: Safety/medical concerns         Walk 10 feet on uneven surface  activity   Assist Walk 10 feet on uneven surfaces activity did not occur: Safety/medical concerns         Wheelchair     Assist Is the patient using a wheelchair?: Yes Type of Wheelchair: Manual    Wheelchair assist level: Minimal Assistance - Patient > 75% Max wheelchair distance: 100    Wheelchair 50 feet with 2 turns activity    Assist        Assist Level: Minimal Assistance - Patient > 75%   Wheelchair 150 feet activity     Assist      Assist Level: Moderate Assistance - Patient 50 - 74%   Blood pressure 136/85, pulse 75, temperature 98.2 F (36.8 C), temperature source Oral, resp. rate 18, height '6\' 6"'$  (1.981 m), weight 102.7 kg, SpO2 96 %.  Medical Problem List and Plan: 1. Functional deficits secondary to sepsis, intra-abdominal abscess status post percutaneous drain and recent colon surgery             -patient may  shower if covers JP drain site             -ELOS/Goals: 10-12 days mod I to intermittent supervision             -CIR, PT/OT evaluations 2.  Antithrombotics: -DVT/anticoagulation:  Pharmaceutical: Other (comment) Eliquis 5 mg twice daily             -antiplatelet therapy: None 3. Pain Management: Tylenol, Percocet as needed 4. Mood: LCSW to evaluate and provide emotional support             -antipsychotic agents: n/a 5. Neuropsych: This patient is capable of making decisions on his own behalf. 6. Skin/Wound Care: Routine skin care checks             --routine drain care 7.  Fluids/Electrolytes/Nutrition: Routine ins and outs and follow-up chemistries 8: Intraabdominal abscess s/p perc drain placement- pt knows might go home with drain             --plan Augmentin for 3-4 weeks per ID             -Drain was placed 5/27, exchanged 5/31 by IR             --  plan follow-up CT 6/12             --continue monitoring drain output; follow-up IR drain clinic 9: Colon cancer s/p right hemicolectomy 5/5 with mets to lymph nodes             --follow-up with Dr. Burr Medico; PET scan pending- outpatient? 10: CAD/prior PCI to LAD             --cath on 5/4-stable continue medical management             --continue Plavix and aspirin             -Denies chest pain 11: Recurrent VT/prior ACID: continue mexiletine and amiodorone             --keep K+ > 4.0; Mg > 2.0             --follow-up with Dr. Caryl Comes 12: Chronic sys and dia HF: continue [place]; Lasix              --daily weight             --follow-up Dr. Sallyanne Kuster             -Continue lasix '40mg'$  BID 6/11 Noted pitting edema +2 on bilateral lower extremities mostly lower legs above ankle.  Per nursing legs were taut overnight. Increased Lasix from 40 mg to 80 mg BID and added 40 mEq K-Dur, trend K+ with qMonday labs. Currently at 3.6, prior to increasing Lasix dose. -Placed in-pt consult to cardiology for Monday follow up on fluid volume status, changes with Lasix, K-dur     Latest Ref Rng & Units 05/23/2022    6:08 AM 05/22/2022   11:23 AM 05/21/2022    5:55 AM  BMP  Glucose 70 - 99 mg/dL 82  92  102   BUN 8 - 23 mg/dL '11  11  12   '$ Creatinine 0.61 - 1.24 mg/dL 0.85  0.75  0.89   Sodium 135 - 145 mmol/L 136  136  135   Potassium 3.5 - 5.1 mmol/L 3.6  3.9  4.4   Chloride 98 - 111 mmol/L 101  101  101   CO2 22 - 32 mmol/L '28  28  25   '$ Calcium 8.9 - 10.3 mg/dL 8.0  7.6  7.8    13: Paroxysmal AF: continue Eliquis 14: Left brachial DVT: continue Eliquis 15: Hypothyroidism; continue Synthroid 16: Hypertension: takes metoprolol  and entresto at home.              -He was restarted on entresto, monitor BP, was discussed with cardiology prior to starting 6/11 Entresto started on 6/9 bp well controlled    05/25/2022    5:00 AM 05/25/2022    2:16 AM 05/24/2022    7:36 PM  Vitals with BMI  Weight 226 lbs 7 oz    BMI 61.60    Systolic  737 106  Diastolic  85 83  Pulse  75 76    17: Seizure disorder: no recent seizure activity; continue Keppra 18: OSA: continue CPAP 19: DM: has been on regular diet without need for insulin coverage; A1c = 5.1             --d/c SSI 20: Hyperlipidemia: continue Zetia, Lipitor 21. Cellulitis? R forearm- already on Unasyn- should cover- will monitor 6/10 continue to monitor R forearm, already on Unasyn 6/11 Reddened area on R forearm, but skin intact. 22. Anemia             -  5/9 HGB 10.0, continue to monitor  6/11 Trend with qMonday labs. 23. Hypoalbuminemia             -Appears to be eating more of his meals, discussed protein intake, continue to follow 24. Anxiety 6/11 Per nursing and pt, he is feeling very anxious today with concerns about medical prognosis. Ordered low dose 0.25 mg Xanax prn for mild anxiety, prn and Neuropsych. evaluation for Monday.   LOS: 3 days A FACE TO FACE EVALUATION WAS PERFORMED  Luetta Nutting 05/25/2022, 1:42 PM

## 2022-05-25 NOTE — IPOC Note (Signed)
Overall Plan of Care [facility]) Patient Details Name: Casanova Schurman MRN: [id number] DOB: [birth year]  Admitting Diagnosis: [facility] Problems: Principal Problem:   Debility Active Problems:   Postprocedural intraabdominal abscess     Functional Problem List: Nursing Bowel, Endurance, Pain, Safety, Medication Management, Skin Integrity  PT Balance, Behavior, Edema, Endurance, Motor, Skin Integrity  OT Balance, Edema, Endurance, Motor, Pain  SLP    TR         Basic ADL's: OT Bathing, Dressing, Toileting     Advanced  ADL's: OT       Transfers: PT Bed Mobility, Bed to Chair, Car, Manufacturing systems engineer, Metallurgist: PT Ambulation, Emergency planning/management officer, Stairs     Additional Impairments: OT Fuctional Use of Upper Extremity  SLP        TR      Anticipated Outcomes Item Anticipated Outcome  Self Feeding Mod I  Swallowing      Basic self-care  Mod I  Insurance underwriter Transfers Supervision  Bowel/Bladder  manage bowel w mod I assist  Transfers  Supervsion assist with LRAD  Locomotion  Ambulatory supervision assist with LRAD  Communication     Cognition     Pain  pain at or below level 4 with prns  Safety/Judgment  maintain safety w cues   Therapy Plan: PT Intensity: Minimum of 1-2 x/day ,45 to 90 minutes PT Frequency: 5 out of 7 days PT Duration Estimated Length of Stay: 14-18 days OT Intensity: Minimum of 1-2 x/day, 45 to 90 minutes OT Frequency: 5 out of 7 days OT Duration/Estimated Length of Stay: 14-18 days     Team Interventions: Nursing Interventions Disease Management/Prevention, Medication Management, Discharge Planning, Bowel Management, Patient/Family Education, Pain Management, Skin Care/Wound Management  PT interventions Ambulation/gait training, Discharge planning, Functional mobility training, Psychosocial support, Therapeutic Activities, Balance/vestibular training, Disease  management/prevention, Neuromuscular re-education, Skin care/wound management, Therapeutic Exercise, Wheelchair propulsion/positioning, Visual/perceptual remediation/compensation, Cognitive remediation/compensation, DME/adaptive equipment instruction, Pain management, UE/LE Strength taining/ROM, UE/LE Coordination activities, Stair training, Patient/family education, Community reintegration  OT Interventions Training and development officer, Discharge planning, Pain management, Self Care/advanced ADL retraining, Therapeutic Activities, UE/LE Coordination activities, Functional mobility training, Patient/family education, Therapeutic Exercise, DME/adaptive equipment instruction, UE/LE Strength taining/ROM, Wheelchair propulsion/positioning, Splinting/orthotics  SLP Interventions    TR Interventions    SW/CM Interventions Discharge Planning, Psychosocial Support, Patient/Family Education   Barriers to Discharge MD  Medical stability  Nursing Decreased caregiver support, Home environment access/layout 2 level, level entry B+B upsteps (14) 1/2 bath on main w son  PT Inaccessible home environment, Decreased caregiver support, Home environment access/layout, Wound Care, Insurance for SNF coverage, Weight    OT Home environment access/layout, Lack of/limited family support    SLP      SW Wound Care, Insurance for SNF coverage     Team Discharge Planning: Destination: PT-  ,OT- Home , SLP-  Projected Follow-up: PT-Home health PT, OT-  Home health OT, SLP-  Projected Equipment Needs: PT-Rolling walker with 5" wheels, Wheelchair (measurements), Wheelchair cushion (measurements), OT- To be determined, SLP-  Equipment Details: PT- , OT-  Patient/family involved in discharge planning: PT- Patient,  OT-Patient, SLP-   MD ELOS: 14-18 days Medical Rehab Prognosis:  Excellent Assessment: The patient has been admitted for CIR therapies with the diagnosis of debility. The team will be addressing functional  mobility, strength, stamina, balance, safety, adaptive techniques and equipment, self-care, bowel and bladder mgt, patient and caregiver education, edema control,  pain mgt, community reentry. Goals have been set at mod I to supervision . Anticipated discharge destination is home with famliy.        See Team Conference Notes for weekly updates to the plan of care

## 2022-05-26 DIAGNOSIS — I10 Essential (primary) hypertension: Secondary | ICD-10-CM

## 2022-05-26 DIAGNOSIS — T8143XA Infection following a procedure, organ and space surgical site, initial encounter: Secondary | ICD-10-CM | POA: Diagnosis not present

## 2022-05-26 DIAGNOSIS — D649 Anemia, unspecified: Secondary | ICD-10-CM | POA: Diagnosis not present

## 2022-05-26 DIAGNOSIS — R5381 Other malaise: Secondary | ICD-10-CM | POA: Diagnosis not present

## 2022-05-26 DIAGNOSIS — E8809 Other disorders of plasma-protein metabolism, not elsewhere classified: Secondary | ICD-10-CM | POA: Diagnosis not present

## 2022-05-26 LAB — CBC
HCT: 35.9 % — ABNORMAL LOW (ref 39.0–52.0)
Hemoglobin: 10.7 g/dL — ABNORMAL LOW (ref 13.0–17.0)
MCH: 25.9 pg — ABNORMAL LOW (ref 26.0–34.0)
MCHC: 29.8 g/dL — ABNORMAL LOW (ref 30.0–36.0)
MCV: 86.9 fL (ref 80.0–100.0)
Platelets: 280 10*3/uL (ref 150–400)
RBC: 4.13 MIL/uL — ABNORMAL LOW (ref 4.22–5.81)
RDW: 24.2 % — ABNORMAL HIGH (ref 11.5–15.5)
WBC: 2.9 10*3/uL — ABNORMAL LOW (ref 4.0–10.5)
nRBC: 0 % (ref 0.0–0.2)

## 2022-05-26 LAB — BASIC METABOLIC PANEL
Anion gap: 6 (ref 5–15)
BUN: 9 mg/dL (ref 8–23)
CO2: 26 mmol/L (ref 22–32)
Calcium: 7.2 mg/dL — ABNORMAL LOW (ref 8.9–10.3)
Chloride: 104 mmol/L (ref 98–111)
Creatinine, Ser: 0.84 mg/dL (ref 0.61–1.24)
GFR, Estimated: >60 mL/min (ref >60)
Glucose, Bld: 100 mg/dL — ABNORMAL HIGH (ref 70–99)
Potassium: 3.7 mmol/L (ref 3.5–5.1)
Sodium: 136 mmol/L (ref 135–145)

## 2022-05-26 NOTE — Progress Notes (Signed)
Physical Therapy Session Note  Patient Details  Name: Gregory [name] MRN: [id number] Date of Birth: [birth year]  Today's Date: 05/26/2022 PT Individual Time: 1410-3013 PT Individual Time Calculation (min): 44 min   Short Term Goals: Week 1:  PT Short Term Goal 1 (Week 1): Pt will perform bed mobility with min PT Short Term Goal 2 (Week 1): Pt will transfer to [facility] with CGA PT Short Term Goal 3 (Week 1): Pt will ascend 4steps with min assist PT Short Term Goal 4 (Week 1): Pt will ambulate 33f with min assist PT Short Term Goal 5 (Week 1): Pt will propell WC 102fwith supervision assist  Skilled Therapeutic Interventions/Progress Updates:      Pt supine in bed to start - agreeable to PT tx and reports unrated LBP - rest breaks and mobilty provided for pain management. Pt fully dressed, requesting assistance to toilet prior to leaving room. Pinned JP drain to shirt. Supine<>sitting EOB with minA for trunk support, heavy reliance of bed rails.   Sit<>stand from raised EOB (pt is 6'6) to RW with minA. Ambulating ~1577fithin his room with minA and RW to toilet (placed elevated BSC over toilet due to height). Pt continent of bladder only, unable to have BM despite urge. Requires assist for LB dressing for toileting tasks and encouragement in effort to assist in managing this. Pt requesting to get clean underwear and pants on - modA for LB dressing despite encouragement.   Transported in w/c to main rehab gym. Gait training ~48f40fth RW and minA - downward gaze, forward flexed trunk, effortful stepping. Gait additional 48ft10fh RW with added 2.5# ankle weights to challenge endurance and strengthening in BLE.   Returned to room in w/c. Ambulatory transfer (10ft)56fh minA and RW back to bed. Required minA for BLE management for sit>supine. Family (son and granddaughter) entering room at end of session. All needs met, bed alarm on, call bell in reach.   Therapy  Documentation Precautions:  Precautions Precautions: Fall Precaution Comments: watch O2 and HR, jp drain Restrictions Weight Bearing Restrictions: No General:    Therapy/Group: Individual Therapy  ChristAlger Simons2023, 7:37 AM

## 2022-05-26 NOTE — Progress Notes (Signed)
Occupational Therapy Session Note  Patient Details  Name: Gregory [name] MRN: [id number] Date of Birth: December 31, 1960  Today's Date: 05/26/2022 OT Individual Time: 1050-1205 OT Individual Time Calculation (min): 75 min    Short Term Goals: Week 1:  OT Short Term Goal 1 (Week 1): Pt will donn LB with AE PRN with mod A OT Short Term Goal 2 (Week 1): Pt will tolerate stance position for >2 mins to promote standing endurance needed for self-care tasks OT Short Term Goal 3 (Week 1): Pt will complete short ambulatory transfer to toilet with min A using LRAD  Skilled Therapeutic Interventions/Progress Updates:    Pt received in bed. Discussed that he could shower with drain sites covered. Obtained tegaderm to cover and discussed shower set up. Pt eager to shower. He had been min A with standing and transfers on eval and in AM PT session.   He sat to EOB with mod cues on how to sit up with less A.  BP elevated 140/98, pt then stood to RW with mod A and tolerated standing for 1 min to assess standing BP at 135/111.   Pt needed to sit and rest. He then stood again and used RW to ambulate to arm chair to sit in at sink. Due to his tall stature, the arm chair was way too low.  Pt brushed teeth, washed UB and changed shirts while I went to find another leg rest for WC as R side was very difficult to get on and off.   Worked on sit to squats from arm chair with minimal success as pt was only able to lift up partially.  He then became very fatigued and unable to stand up from arm chair. NT arrived for a +2 A but pt unable to push up even with max A.  Scooted chair to side of bed so pt could do a squat pivot.  Max to squat pivot.  Mod to move to supine and adjust in bed. Pt in bed with all needs met.  Alarm set.   Therapy Documentation Precautions:  Precautions Precautions: Fall Precaution Comments: watch O2 and HR, jp drain Restrictions Weight Bearing Restrictions: No  Pain: c/o pain in B  thighs from muscular fatigue   ADL: ADL Eating: Set up Where Assessed-Eating: Bed level Grooming: Setup Where Assessed-Grooming: Edge of bed Upper Body Bathing: Supervision/safety Where Assessed-Upper Body Bathing: Edge of bed Lower Body Bathing: Moderate assistance Where Assessed-Lower Body Bathing: Edge of bed Upper Body Dressing: Minimal assistance Where Assessed-Upper Body Dressing: Edge of bed Lower Body Dressing: Maximal assistance Where Assessed-Lower Body Dressing: Edge of bed Toileting: Maximal assistance Where Assessed-Toileting: Other (Comment) (simulated at EOB) Toilet Transfer: Minimal assistance Toilet Transfer Method: Other (comment) (side step, simulated) Tub/Shower Transfer: Unable to assess Tub/Shower Transfer Method: Unable to assess [employer] Transfer: Unable to assess [employer] Method: Unable to assess   Therapy/Group: Individual Therapy  Auryn Paige 05/26/2022, 12:29 PM

## 2022-05-26 NOTE — Progress Notes (Signed)
Pt has home machine and home tubing at bedside. Sterile water placed in humidification chamber. Pt tried mask on to see how it would feel with his machine and stated it felt ok and he would place on when he finished watching the game.

## 2022-05-26 NOTE — Progress Notes (Signed)
Physical Therapy Session Note  Patient Details  Name: Gregory [name] MRN: [id number] Date of Birth: Feb 24, 1961  Today's Date: 05/26/2022 PT Individual Time: 1300-1415 PT Individual Time Calculation (min): 75 min   Short Term Goals: Week 1:  PT Short Term Goal 1 (Week 1): Pt will perform bed mobility with min PT Short Term Goal 2 (Week 1): Pt will transfer to [facility] with CGA PT Short Term Goal 3 (Week 1): Pt will ascend 4steps with min assist PT Short Term Goal 4 (Week 1): Pt will ambulate 25f with min assist PT Short Term Goal 5 (Week 1): Pt will propell WC 1061fwith supervision assist  Skilled Therapeutic Interventions/Progress Updates: Pt presents supine in bed and agreeable to therapy.  Pt performed sup to sit using siderails and verbal cues to initiate and then mod A for bringing UB to bedside.  Pt scooted to EOB w/ supervision.  Pt w/ improved mood this PM, w/ good participation.  Pt transferred throughout session from w/c and Nu-step w/ min A w/ cues for initiating especially forward lean and breathing technique.  Pt amb in room to w/c w/ min A and improved upright posture.  Pt required education for safe turns maintaining RW position in closer proximity and then for safe approach.  Pt wheeled to dayroom for energy conservation.  Pt amb x 10' to Nu-step w/ RW and performed 5' x 2 at Level 3 for improved UE/LE strength and endurance, 5' w/ all 4 extremities, 5' w/ LEs only.  Pt stated need to return to BR.  Pt amb x 15' w/ RW into BR and CGA to min A for toilet transfer w/ steadying only to manage clothing.  Pt continent of B and B on toilet.  Pt required completion of pericare while standing.  PT lifted pants from ankle level to knees and pt pulls up over hips.  Pt amb x 10' to w/c for seated rest break.  Pt stood and amb to sink for handwashing w/ min A.  Pt amb x 40' x 2 w/ RW and min A, improved posture.  Pt amb x 10' to bed w/ min A and proper turn and transfer stand to sit at bed.   Pt able to bring B LEs onto bed w/ min A for RLE only.  Pt scooted self to center of bed.  Bed alarm on and all needs in reach.     Therapy Documentation Precautions:  Precautions Precautions: Fall Precaution Comments: watch O2 and HR, jp drain Restrictions Weight Bearing Restrictions: No General:   Vital Signs:  Pain:4/10 w/o meds recently.      Therapy/Group: Individual Therapy  JeLadoris Gene/11/2022, 2:18 PM

## 2022-05-26 NOTE — Discharge Instructions (Addendum)
Inpatient Rehab Discharge Instructions  Robey Massmann Discharge date and time: 06/06/2022  Activities/Precautions/ Functional Status: Activity: no lifting, driving, or strenuous exercise until cleared by MD Diet: cardiac diet Wound Care: keep wound clean and dry.  Se drain care instructions.  Functional status:  ___ No restrictions     ___ Walk up steps independently ___ 24/7 supervision/assistance   ___ Walk up steps with assistance _x__ Intermittent supervision/assistance  ___ Bathe/dress independently ___ Walk with walker     ___ Bathe/dress with assistance ___ Walk Independently    ___ Shower independently ___ Walk with assistance    __x_ Shower with assistance __x_ No alcohol     ___ Return to work/school ________  Special Instructions:  No driving, alcohol consumption or tobacco use.   COMMUNITY REFERRALS UPON DISCHARGE:    Home Health:   PT-[facility] BUT REPORT HHRN NOT COVERED DUE TO NOT A HEART ISSUE-[name] WILL CONTACT PATIENT ONCE ARRANGED  Medical Equipment/Items Ordered:TALL ROLLING WALKER                                                 Agency/Supplier: [facility] TO PATIENT'S HOME    My questions have been answered and I understand these instructions. I will adhere to these goals and the provided educational materials after my discharge from the hospital.  Patient/Caregiver Signature _______________________________ Date __________  Clinician Signature _______________________________________ Date __________  Please bring this form and your medication list with you to all your follow-up doctor's appointments.

## 2022-05-26 NOTE — Progress Notes (Signed)
PROGRESS NOTE   Subjective/Complaints: Pt reports therapy has been going well. No complaints or concerns this AM.   ROS: Negative for SOB, CP, dizziness, N/V/C/D.  Positive for bilateral lower leg swelling. Positive for anxiety. No HA    Objective:   No results found. Recent Labs    05/26/22 0558  WBC 2.9*  HGB 10.7*  HCT 35.9*  PLT 280    Recent Labs    05/26/22 0558  NA 136  K 3.7  CL 104  CO2 26  GLUCOSE 100*  BUN 9  CREATININE 0.84  CALCIUM 7.2*     Intake/Output Summary (Last 24 hours) at 05/26/2022 0751 Last data filed at 05/26/2022 0409 Gross per 24 hour  Intake 5 ml  Output 3280 ml  Net -3275 ml         Physical Exam: Vital Signs Blood pressure (!) 140/95, pulse 75, temperature 98.4 F (36.9 C), temperature source Oral, resp. rate 18, height '6\' 6"'$  (1.981 m), weight 100 kg, SpO2 92 %.  Physical Exam    General: Alert and oriented x 3, No apparent distress HEENT: Head is normocephalic, atraumatic, PERRLA, EOMI bilateral, sclera anicteric, oral mucosa pink and moist, dentition intact, ext ear canals clear,  Note: Both eyes have chronic fatty deposits on lateral aspects of eye not interfering with vision.  Left > Right. Neck: Supple without JVD or lymphadenopathy Heart: Reg rate and rhythm. No murmurs rubs or gallops Chest: CTA bilaterally without wheezes, rales, or rhonchi; no distress Abdomen: Soft, non-tender, non-distended, bowel sounds positive. Has bandage on R side of abd with JP drain, with moderate amount purulent drainage. Extremities: No clubbing, cyanosis. Pulses are 2+. 2+ LE edema Psych: Pt's affect is appropriate. Pt is cooperative Skin: Warm and dry. Small area superficial skin breakdown left sacral area.  Skin tear R buttock, nickel sized. Rt forearm reddend from previous IV. Neuro: Alert and oriented x3, CN II-XII intact, no focal deficits. Sensory: Intact to LT all 4  extremities Musculoskeletal: Neck supple, cervical back supple. No tenderness.  Lt arm forearm to mid-upper arm non-pitting 2+ bilateral LE's 2+ pitting edema to knees bilateral. Motor: 5-/5 throughout except LLE 4+/5   Assessment/Plan: 1. Functional deficits which require 3+ hours per day of interdisciplinary therapy in a comprehensive inpatient rehab setting. Physiatrist is providing close team supervision and 24 hour management of active medical problems listed below. Physiatrist and rehab team continue to assess barriers to discharge/monitor patient progress toward functional and medical goals  Care Tool:  Bathing    Body parts bathed by patient: Right arm, Left arm, Chest, Abdomen, Right upper leg, Left upper leg, Face   Body parts bathed by helper: Front perineal area, Buttocks, Right lower leg, Left lower leg     Bathing assist Assist Level: Moderate Assistance - Patient 50 - 74%     Upper Body Dressing/Undressing Upper body dressing   What is the patient wearing?: Pull over shirt    Upper body assist Assist Level: Moderate Assistance - Patient 50 - 74%    Lower Body Dressing/Undressing Lower body dressing      What is the patient wearing?: Underwear/pull up  Lower body assist Assist for lower body dressing: Maximal Assistance - Patient 25 - 49%     Toileting Toileting    Toileting assist Assist for toileting: Moderate Assistance - Patient 50 - 74%     Transfers Chair/bed transfer  Transfers assist     Chair/bed transfer assist level: Moderate Assistance - Patient 50 - 74%     Locomotion Ambulation   Ambulation assist      Assist level: Minimal Assistance - Patient > 75% Assistive device: Walker-rolling Max distance: 15   Walk 10 feet activity   Assist     Assist level: Minimal Assistance - Patient > 75% Assistive device: Walker-rolling   Walk 50 feet activity   Assist Walk 50 feet with 2 turns activity did not occur:  Safety/medical concerns         Walk 150 feet activity   Assist Walk 150 feet activity did not occur: Safety/medical concerns         Walk 10 feet on uneven surface  activity   Assist Walk 10 feet on uneven surfaces activity did not occur: Safety/medical concerns         Wheelchair     Assist Is the patient using a wheelchair?: Yes Type of Wheelchair: Manual    Wheelchair assist level: Minimal Assistance - Patient > 75% Max wheelchair distance: 100    Wheelchair 50 feet with 2 turns activity    Assist        Assist Level: Minimal Assistance - Patient > 75%   Wheelchair 150 feet activity     Assist      Assist Level: Moderate Assistance - Patient 50 - 74%   Blood pressure (!) 140/95, pulse 75, temperature 98.4 F (36.9 C), temperature source Oral, resp. rate 18, height '6\' 6"'$  (1.981 m), weight 100 kg, SpO2 92 %.  Medical Problem List and Plan: 1. Functional deficits secondary to sepsis, intra-abdominal abscess status post percutaneous drain and recent colon surgery             -patient may  shower if covers JP drain site             -ELOS/Goals: 10-12 days mod I to intermittent supervision             -Continue CIR, continue  PT and OT 2.  Antithrombotics: -DVT/anticoagulation:  Pharmaceutical: Other (comment) Eliquis 5 mg twice daily             -antiplatelet therapy: None 3. Pain Management: Tylenol, Percocet as needed 4. Mood: LCSW to evaluate and provide emotional support             -antipsychotic agents: n/a 5. Neuropsych: This patient is capable of making decisions on his own behalf. 6. Skin/Wound Care: Routine skin care checks             --routine drain care 7. Fluids/Electrolytes/Nutrition: Routine ins and outs and follow-up chemistries 8: Intraabdominal abscess s/p perc drain placement- pt knows might go home with drain             --plan Augmentin for 3-4 weeks per ID             -Drain was placed 5/27, exchanged 5/31 by IR              --plan follow-up CT 6/12             --continue monitoring drain output; follow-up IR drain clinic 9: Colon cancer s/p right hemicolectomy 5/5 with mets  to lymph nodes             --follow-up with Dr. Burr Medico; PET scan pending- outpatient? 10: CAD/prior PCI to LAD             --cath on 5/4-stable continue medical management             --continue Plavix and aspirin             -Denies chest pain 11: Recurrent VT/prior ACID: continue mexiletine and amiodorone             --keep K+ > 4.0; Mg > 2.0             --follow-up with Dr. Caryl Comes 12: Chronic sys and dia HF: continue [place]; Lasix              --daily weight             --follow-up Dr. Sallyanne Kuster             -Continue lasix '40mg'$  BID 6/11 Noted pitting edema +2 on bilateral lower extremities mostly lower legs above ankle.  Per nursing legs were taut overnight. Increased Lasix from 40 mg to 80 mg BID and added 40 mEq K-Dur, trend K+ with qMonday labs. Currently at 3.6, prior to increasing Lasix dose. -Placed in-pt consult to cardiology for Monday follow up on fluid volume status, changes with Lasix, K-dur 6/12 K+ 3.9 today, stable continue to follow     Latest Ref Rng & Units 05/26/2022    5:58 AM 05/23/2022    6:08 AM 05/22/2022   11:23 AM  BMP  Glucose 70 - 99 mg/dL 100  82  92   BUN 8 - 23 mg/dL '9  11  11   '$ Creatinine 0.61 - 1.24 mg/dL 0.84  0.85  0.75   Sodium 135 - 145 mmol/L 136  136  136   Potassium 3.5 - 5.1 mmol/L 3.7  3.6  3.9   Chloride 98 - 111 mmol/L 104  101  101   CO2 22 - 32 mmol/L '26  28  28   '$ Calcium 8.9 - 10.3 mg/dL 7.2  8.0  7.6    13: Paroxysmal AF: continue Eliquis 14: Left brachial DVT: continue Eliquis 15: Hypothyroidism; continue Synthroid 16: Hypertension: takes metoprolol and entresto at home.              -He was restarted on entresto, monitor BP, was discussed with cardiology prior to starting 6/12 BP continues to be overall well controlled, continue to monitor, consider restart BB    05/26/2022     5:00 AM 05/26/2022    3:47 AM 05/25/2022    7:48 PM  Vitals with BMI  Weight 220 lbs 7 oz    BMI 82.42    Systolic  353 614  Diastolic  95 93  Pulse  75 75    17: Seizure disorder: no recent seizure activity; continue Keppra 18: OSA: continue CPAP 19: DM: has been on regular diet without need for insulin coverage; A1c = 5.1             --d/c SSI 20: Hyperlipidemia: continue Zetia, Lipitor 21. Cellulitis? R forearm- already on Unasyn- should cover- will monitor 6/10 continue to monitor R forearm, already on Unasyn 6/11 Reddened area on R forearm, but skin intact. 22. Anemia             -5/12 HGB stable at 10.7 today 6/11 Trend with qMonday labs. 23. Hypoalbuminemia             -  Appears to be eating more of his meals, discussed protein intake, continue to follow 24. Anxiety 6/11 Per nursing and pt, he is feeling very anxious today with concerns about medical prognosis. Ordered low dose 0.25 mg Xanax prn for mild anxiety, prn and Neuropsych. evaluation for Monday.   LOS: 4 days A FACE TO FACE EVALUATION WAS PERFORMED  Jennye Boroughs 05/26/2022, 7:51 AM

## 2022-05-27 LAB — GLUCOSE, CAPILLARY: Glucose-Capillary: 115 mg/dL — ABNORMAL HIGH (ref 70–99)

## 2022-05-27 NOTE — Progress Notes (Signed)
Patient has refused CPAP for tonight. 

## 2022-05-27 NOTE — Progress Notes (Signed)
Occupational Therapy Session Note  Patient Details  Name: Gregory [name] MRN: [id number] Date of Birth: 02-Feb-1961  Today's Date: 05/27/2022 OT Individual Time: 1050-1205 OT Individual Time Calculation (min): 75 min    Short Term Goals: Week 1:  OT Short Term Goal 1 (Week 1): Pt will donn LB with AE PRN with mod A OT Short Term Goal 2 (Week 1): Pt will tolerate stance position for >2 mins to promote standing endurance needed for self-care tasks OT Short Term Goal 3 (Week 1): Pt will complete short ambulatory transfer to toilet with min A using LRAD Week 2:     Skilled Therapeutic Interventions/Progress Updates:   Patient seen this AM for skilled OT, pt seated at w/c LOF requesting BADL in bathing at sink LOF.  The pt was able to doff/donn a over head top with SBA, he was bathe his UB with s/u assist, and he was s/u assist for applying deodorant and lotion. The pt was CGA for coming from sit to stand using the RW for static standing balance, he was SBA for doffing his pant and MinA for donning them over his bottom. The pt presented with a BP of 114/87, with a HR of 97, the pt was encouraged to incorporate relaxation breathing  and rest breaks to improve compliance and to offset adverse response, pt required 2 rest breaks at the time of treatment.  The pt went on to complete UB exercises using a 3lb dumb bell 2 sets of 15 for bicep curls and shld flexion with rest breaks as needed, the pt required 1 rest break. .Pt returned to bed LOF requiring MinA using a stand pivot transfer incorporating the RW for additional balance.  The pt required MinA for managing his BLE onto the bed, he was able to offer assistance for repositioning in supine with bilateral LE resting on a pillow.  At the end of treatment the pt's bedside table and call light were within reach and his alarm was activated.  All additional needs were addressed the pt reported a pain response of 4 and indicated that nursing was  aware.   Therapy Documentation Precautions:  Precautions Precautions: Fall Precaution Comments: watch O2 and HR, jp drain Restrictions Weight Bearing Restrictions: No  Therapy/Group: Individual Therapy  Yvonne Kendall 05/27/2022, 12:43 PM

## 2022-05-27 NOTE — Progress Notes (Signed)
Physical Therapy Session Note  Patient Details  Name: Gregory [name] MRN: [id number] Date of Birth: [birth year]  Today's Date: 05/27/2022 PT Individual Time: 0962-8366 PT Individual Time Calculation (min): 55 min   Short Term Goals: Week 1:  PT Short Term Goal 1 (Week 1): Pt will perform bed mobility with min PT Short Term Goal 2 (Week 1): Pt will transfer to [facility] with CGA PT Short Term Goal 3 (Week 1): Pt will ascend 4steps with min assist PT Short Term Goal 4 (Week 1): Pt will ambulate 29f with min assist PT Short Term Goal 5 (Week 1): Pt will propell WC 1061fwith supervision assist Week 2:    Week 3:     Skilled Therapeutic Interventions/Progress Updates:    C/o LBP but declines meds  treatment to tolerance.   Pt received oob in recliner, completing breakfast.  Requesting urinal. Declines commode stating "that was a disaster last time".   Voids using urinal w/set up assist only seated in recliner.  Short distance gait to wc/Sit to stand to/from recliner/wc w/cga.  Stood 1 min while therapist tied waistband of pants, cga.  Wc propulsion 7593f/bilat Ues mod I, slow pace.  In parallel bars: Sidestepping 8ft45fR x 2 w/cga, repeated x 2 Seated rest between efforts Alternating tapping 6in step w/bilat UE support of bars 1x14, 1x 24 Seated rest between efforts Step ups on 3in step of training stairs w/bilat UE support and cues to isolate quads/gluts Repeated x 2 approx 20reps, seated rest between efforts  Wc propulsion x 75ft79filat Les. Pt left oob in wc w/alarm belt set and needs in reach    Therapy Documentation Precautions:  Precautions Precautions: Fall Precaution Comments: watch O2 and HR, jp drain Restrictions Weight Bearing Restrictions: No Pt iniTherapy/Group: Individual Therapy  BarbaJerrilyn Cairo/2023, 12:05 PM

## 2022-05-27 NOTE — Progress Notes (Signed)
PROGRESS NOTE   Subjective/Complaints: Working with therapy this AM. No new complaints or concerns.  Robaxin and oxycodone helping back pain.  ROS: Negative for fever, chills, SOB, CP, dizziness, N/V/C/D.  Positive for bilateral lower leg swelling.     Objective:   No results found. Recent Labs    05/26/22 0558  WBC 2.9*  HGB 10.7*  HCT 35.9*  PLT 280    Recent Labs    05/26/22 0558  NA 136  K 3.7  CL 104  CO2 26  GLUCOSE 100*  BUN 9  CREATININE 0.84  CALCIUM 7.2*     Intake/Output Summary (Last 24 hours) at 05/27/2022 0742 Last data filed at 05/27/2022 0519 Gross per 24 hour  Intake 725 ml  Output 2860 ml  Net -2135 ml         Physical Exam: Vital Signs Blood pressure (!) 141/96, pulse 73, temperature 98.3 F (36.8 C), temperature source Oral, resp. rate 18, height '6\' 6"'$  (1.981 m), weight 99.8 kg, SpO2 94 %.  Physical Exam    General: Alert and oriented x 3, No apparent distress HEENT: Head is normocephalic, atraumatic, PERRLA, EOMI bilateral, sclera anicteric, oral mucosa pink and moist, dentition intact, ext ear canals clear,  Note: Both eyes have chronic fatty deposits on lateral aspects of eye not interfering with vision.  Left > Right. Neck: Supple without JVD or lymphadenopathy Heart: Reg rate and rhythm. No murmurs rubs or gallops Chest: CTA bilaterally without wheezes, rales, or rhonchi; no distress Abdomen: Soft, non-tender, non-distended, bowel sounds positive. Has bandage on R side of abd with JP drain, with moderate amount purulent drainage. Extremities: No clubbing, cyanosis. Pulses are 2+. 2+ LE edema Psych: Pt's affect is appropriate. Pt is cooperative Skin: Warm and dry. Small area superficial skin breakdown left sacral area.  Skin tear R buttock, nickel sized. Rt forearm reddend from previous IV. Neuro: Alert and oriented x3, CN II-XII intact, no focal deficits. Sensory: Intact  to LT all 4 extremities Musculoskeletal: Neck supple, cervical back supple. No tenderness.  Lt arm forearm to mid-upper arm non-pitting 2+ bilateral LE's 2+ pitting edema to knees bilateral, appears a little improved from yesterday Motor: 5-/5 throughout except LLE 4+/5   Assessment/Plan: 1. Functional deficits which require 3+ hours per day of interdisciplinary therapy in a comprehensive inpatient rehab setting. Physiatrist is providing close team supervision and 24 hour management of active medical problems listed below. Physiatrist and rehab team continue to assess barriers to discharge/monitor patient progress toward functional and medical goals  Care Tool:  Bathing    Body parts bathed by patient: Right arm, Left arm, Chest, Abdomen, Right upper leg, Left upper leg, Face   Body parts bathed by helper: Front perineal area, Buttocks, Right lower leg, Left lower leg     Bathing assist Assist Level: Moderate Assistance - Patient 50 - 74%     Upper Body Dressing/Undressing Upper body dressing   What is the patient wearing?: Pull over shirt    Upper body assist Assist Level: Moderate Assistance - Patient 50 - 74%    Lower Body Dressing/Undressing Lower body dressing      What is the patient  wearing?: Underwear/pull up     Lower body assist Assist for lower body dressing: Maximal Assistance - Patient 25 - 49%     Toileting Toileting    Toileting assist Assist for toileting: Moderate Assistance - Patient 50 - 74%     Transfers Chair/bed transfer  Transfers assist     Chair/bed transfer assist level: Moderate Assistance - Patient 50 - 74%     Locomotion Ambulation   Ambulation assist      Assist level: Minimal Assistance - Patient > 75% Assistive device: Walker-rolling Max distance: 40   Walk 10 feet activity   Assist     Assist level: Minimal Assistance - Patient > 75% Assistive device: Walker-rolling   Walk 50 feet activity   Assist Walk 50  feet with 2 turns activity did not occur: Safety/medical concerns         Walk 150 feet activity   Assist Walk 150 feet activity did not occur: Safety/medical concerns         Walk 10 feet on uneven surface  activity   Assist Walk 10 feet on uneven surfaces activity did not occur: Safety/medical concerns         Wheelchair     Assist Is the patient using a wheelchair?: Yes Type of Wheelchair: Manual    Wheelchair assist level: Minimal Assistance - Patient > 75% Max wheelchair distance: 100    Wheelchair 50 feet with 2 turns activity    Assist        Assist Level: Minimal Assistance - Patient > 75%   Wheelchair 150 feet activity     Assist      Assist Level: Moderate Assistance - Patient 50 - 74%   Blood pressure (!) 141/96, pulse 73, temperature 98.3 F (36.8 C), temperature source Oral, resp. rate 18, height '6\' 6"'$  (1.981 m), weight 99.8 kg, SpO2 94 %.  Medical Problem List and Plan: 1. Functional deficits secondary to sepsis, intra-abdominal abscess status post percutaneous drain and recent colon surgery             -patient may  shower if covers JP drain site             -ELOS/Goals: 10-12 days mod I to intermittent supervision             -Continue CIR, continue  PT and OT  -Continue to work on endurance with therapy 2.  Antithrombotics: -DVT/anticoagulation:  Pharmaceutical: Other (comment) Eliquis 5 mg twice daily             -antiplatelet therapy: None 3. Pain Management: Tylenol, Percocet as needed 4. Mood: LCSW to evaluate and provide emotional support             -antipsychotic agents: n/a 5. Neuropsych: This patient is capable of making decisions on his own behalf. 6. Skin/Wound Care: Routine skin care checks             --routine drain care 7. Fluids/Electrolytes/Nutrition: Routine ins and outs and follow-up chemistries 8: Intraabdominal abscess s/p perc drain placement- pt knows might go home with drain             --plan  Augmentin for 3-4 weeks per ID             -Drain was placed 5/27, exchanged 5/31 by IR             --plan follow-up CT 6/12             --continue monitoring  drain output; follow-up IR drain clinic  -Drain output appears to be gradually decreasing 9: Colon cancer s/p right hemicolectomy 5/5 with mets to lymph nodes             --follow-up with Dr. Burr Medico; PET scan pending- outpatient? 10: CAD/prior PCI to LAD             --cath on 5/4-stable continue medical management             --continue Plavix and aspirin             -Denies chest pain 11: Recurrent VT/prior ACID: continue mexiletine and amiodorone             --keep K+ > 4.0; Mg > 2.0             --follow-up with Dr. Caryl Comes 12: Chronic sys and dia HF: continue [place]; Lasix              --daily weight             --follow-up Dr. Sallyanne Kuster             -Continue lasix '40mg'$  BID 6/11 Noted pitting edema +2 on bilateral lower extremities mostly lower legs above ankle.  Per nursing legs were taut overnight. Increased Lasix from 40 mg to 80 mg BID and added 40 mEq K-Dur, trend K+ with qMonday labs. Currently at 3.6, prior to increasing Lasix dose. -Placed in-pt consult to cardiology for Monday follow up on fluid volume status, changes with Lasix, K-dur 6/12 K+ 3.9 today, stable continue to follow -Recheck labs, edema appears a little improved     Latest Ref Rng & Units 05/26/2022    5:58 AM 05/23/2022    6:08 AM 05/22/2022   11:23 AM  BMP  Glucose 70 - 99 mg/dL 100  82  92   BUN 8 - 23 mg/dL '9  11  11   '$ Creatinine 0.61 - 1.24 mg/dL 0.84  0.85  0.75   Sodium 135 - 145 mmol/L 136  136  136   Potassium 3.5 - 5.1 mmol/L 3.7  3.6  3.9   Chloride 98 - 111 mmol/L 104  101  101   CO2 22 - 32 mmol/L '26  28  28   '$ Calcium 8.9 - 10.3 mg/dL 7.2  8.0  7.6    13: Paroxysmal AF: continue Eliquis 14: Left brachial DVT: continue Eliquis 15: Hypothyroidism; continue Synthroid 16: Hypertension: takes metoprolol and entresto at home.              -He  was restarted on entresto, monitor BP, was discussed with cardiology prior to starting 6/12 BP continues to be overall well controlled, continue to monitor, consider restart BB -BP overall well controlled, continue to monitor    05/27/2022    5:18 AM 05/27/2022    5:00 AM 05/26/2022    7:24 PM  Vitals with BMI  Weight  220 lbs   BMI  91.79   Systolic 150  569  Diastolic 96  89  Pulse 73  80    17: Seizure disorder: no recent seizure activity; continue Keppra 18: OSA: continue CPAP 19: DM: has been on regular diet without need for insulin coverage; A1c = 5.1             --d/c SSI 20: Hyperlipidemia: continue Zetia, Lipitor 21. Cellulitis? R forearm- already on Unasyn- should cover- will monitor 6/10 continue to monitor R forearm, already on Unasyn 6/11 Reddened area  on R forearm, but skin intact. 22. Anemia             -5/12 HGB stable at 10.7 today 6/11 Trend with qMonday labs. Recheck labs 23. Hypoalbuminemia             -Appears to be eating more of his meals, discussed protein intake, continue to follow 24. Anxiety 6/11 Per nursing and pt, he is feeling very anxious today with concerns about medical prognosis. Ordered low dose 0.25 mg Xanax prn for mild anxiety, prn and Neuropsych. evaluation for Monday.   LOS: 5 days A FACE TO FACE EVALUATION WAS PERFORMED  Jennye Boroughs 05/27/2022, 7:42 AM

## 2022-05-27 NOTE — Progress Notes (Signed)
Physical Therapy Session Note  Patient Details  Name: Gregory [name] MRN: [id number] Date of Birth: [birth year]  Today's Date: 05/27/2022 PT Individual Time: 0805-0900 PT Individual Time Calculation (min): 55 min   Short Term Goals: Week 1:  PT Short Term Goal 1 (Week 1): Pt will perform bed mobility with min PT Short Term Goal 2 (Week 1): Pt will transfer to [facility] with CGA PT Short Term Goal 3 (Week 1): Pt will ascend 4steps with min assist PT Short Term Goal 4 (Week 1): Pt will ambulate 85f with min assist PT Short Term Goal 5 (Week 1): Pt will propell WC 1045fwith supervision assist  Skilled Therapeutic Interventions/Progress Updates: Pt presented in bed agreeable to therapy. Pt states slept well last night bus has some back pain which pt did not rate. PTA donned TED hose total A. Pt Performed supine to sit with supervision, increased time and use of bed feautres. Pt requesting to change pants. Pt able to stand with CGA and pull pants over hips, pt returned to sitting to remove pants and was able to lean forward and don fresh pants, pt then stood in same manner to pull pants over hips. After seated rest performed ambulatory transfer to sink with RW and CGA and sat in w/c to perform oral hygiene at w/c level. Pt then propelled to dayrooom 9043for general conditioning and BUE strengthening. In day room pt participated in ambulation 34f74fth RW and CGA. Pt required min cues for improved erect posture but no instability noted nor LOB. Pt then participated in x 2 bouts of horseshoes in standing for reaching task and standing tolerance. Pt able to tolerate ~2 min in standing before requiring a seated rest. Pt transported back to room and with encouragement agreeable to sit in recliner until next session (approx 45 min). Once set up performed ambulatory transfer to recliner with CGA. Pt left in recliner at end of session with call bell within reach and needs met.      Therapy  Documentation Precautions:  Precautions Precautions: Fall Precaution Comments: watch O2 and HR, jp drain Restrictions Weight Bearing Restrictions: No General:   Vital Signs:  Pain:   Mobility:   Locomotion :    Trunk/Postural Assessment :    Balance:   Exercises:   Other Treatments:      Therapy/Group: Individual Therapy  Dann Galicia 05/27/2022, 12:33 PM

## 2022-05-28 ENCOUNTER — Ambulatory Visit: Payer: Commercial Managed Care - HMO | Admitting: Hematology

## 2022-05-28 ENCOUNTER — Other Ambulatory Visit: Payer: Commercial Managed Care - HMO

## 2022-05-28 DIAGNOSIS — M79601 Pain in right arm: Secondary | ICD-10-CM

## 2022-05-28 DIAGNOSIS — R609 Edema, unspecified: Secondary | ICD-10-CM

## 2022-05-28 LAB — CBC
HCT: 35.7 % — ABNORMAL LOW (ref 39.0–52.0)
Hemoglobin: 10.6 g/dL — ABNORMAL LOW (ref 13.0–17.0)
MCH: 25.6 pg — ABNORMAL LOW (ref 26.0–34.0)
MCHC: 29.7 g/dL — ABNORMAL LOW (ref 30.0–36.0)
MCV: 86.2 fL (ref 80.0–100.0)
Platelets: 306 10*3/uL (ref 150–400)
RBC: 4.14 MIL/uL — ABNORMAL LOW (ref 4.22–5.81)
RDW: 24.3 % — ABNORMAL HIGH (ref 11.5–15.5)
WBC: 3.2 10*3/uL — ABNORMAL LOW (ref 4.0–10.5)
nRBC: 0 % (ref 0.0–0.2)

## 2022-05-28 LAB — BASIC METABOLIC PANEL
Anion gap: 5 (ref 5–15)
BUN: 8 mg/dL (ref 8–23)
CO2: 28 mmol/L (ref 22–32)
Calcium: 7.7 mg/dL — ABNORMAL LOW (ref 8.9–10.3)
Chloride: 103 mmol/L (ref 98–111)
Creatinine, Ser: 0.75 mg/dL (ref 0.61–1.24)
GFR, Estimated: >60 mL/min (ref >60)
Glucose, Bld: 99 mg/dL (ref 70–99)
Potassium: 3.6 mmol/L (ref 3.5–5.1)
Sodium: 136 mmol/L (ref 135–145)

## 2022-05-28 NOTE — Progress Notes (Signed)
Physical Therapy Session Note  Patient Details  Name: Gregory [name] MRN: [id number] Date of Birth: [birth year]  Today's Date: 05/28/2022 PT Individual Time: 5784-6962 and 1305-1420 PT Individual Time Calculation (min): 55 min and 75 min  Short Term Goals: Week 1:  PT Short Term Goal 1 (Week 1): Pt will perform bed mobility with min PT Short Term Goal 2 (Week 1): Pt will transfer to [facility] with CGA PT Short Term Goal 3 (Week 1): Pt will ascend 4steps with min assist PT Short Term Goal 4 (Week 1): Pt will ambulate 42f with min assist PT Short Term Goal 5 (Week 1): Pt will propell WC 1076fwith supervision assist  Skilled Therapeutic Interventions/Progress Updates: Pt presented in w/[place] agreeable to therapy. Per OT and pt increased soreness in RUE (shoulder) which OT believes more muscular in nature and provided trigger point and myofacial work at end of therapist's session. Pt transported to rehab gym for energy conservation with w/c mobility deferred this session. Pt performed toe taps to 6in step x 10 (first step of stairs), then 2 x 4 to second step for hip flexor strengthening. Pt then ambulated ~6039fith RW and CGA for both Sit to stand and ambulation. Participated seated LE therex for general strengthening including LAQ, hip flexion, and standing abd/add 2 x 10 with 3# weighted cuff with seated rests between bouts. Pt then transported partial distance and ambulated ~50f77fth RW and CGA. Pt with mild unsteadiness due to fatigue however no LOB. At EOB pt was able to transfer to light minA for RLE management onto bed. Pt repositioned to comfort and left with bed alarm on, call bell within reach and needs met.   Tx2: Pt presented in bed agreeable to therapy. Pt c/o pain in R shoulder with mobility rest breakd provided as needed. Performed supine to sit with CGA, use of bed features and increased time. Performed ambulatory transfer to w/c with CGA overall. Pt  transported rehab gym for energy conservation. Participated in step ups to 6in step for initiation of stair training. Pt was able to perform 2 x 5 steps ups using B rails with extended seated rest between bouts due to fatigue. Pt then participated in x 3 bouts of horseshoes with 1lb cuffs on B wrists for strengthening/endurance. Pt with notable limited flexion in RUE when attempting to reach for objects but was able to stand ~2 min bouts consistently however requiring increased time between bouts for recovery. Pt then ambulated 65ft55fh RW with CGA with mild unsteadiness noted. Pt transported to day room and performed stand pivot transfer to NuStep CGA with RW. Participated in x 5 min L4 NuStep BLE only for general conditioning and increased tolerance of BLE use. Pt was able to stand from NuStep Level seat with CGA and increased effort to transfer to w/c. Pt transported back to room and performed stand pivot transfer in same manner to bed. Pt required minA to mange BLE at this time. Pt repositioned to comfort and left with bed alarm on, call bell within reach and needs met.      Therapy Documentation Precautions:  Precautions Precautions: Fall Precaution Comments: watch O2 and HR, jp drain Restrictions Weight Bearing Restrictions: No General:   Vital Signs: Therapy Vitals Temp: 98 F (36.7 C) Pulse Rate: 77 Resp: 18 BP: 129/90 Patient Position (if appropriate): Lying Oxygen Therapy SpO2: 96 % O2 Device: Room Air Pain: Pain Assessment Pain Score: 3  Mobility:  Locomotion :    Trunk/Postural Assessment :    Balance:   Exercises:   Other Treatments:      Therapy/Group: Individual Therapy  Gustavia Carie 05/28/2022, 4:37 PM

## 2022-05-28 NOTE — Progress Notes (Signed)
RUQ Percutaneous fluid collection drain placed 5/31 Seen in follow-up today Drain Location: RUQ Size: Fr size: 14 Fr Date of placement: 5/27; exchanged 5/31  Currently to: Drain collection device: suction bulb 24 hour output:  Output by Drain (mL) 05/26/22 0701 - 05/26/22 1900 05/26/22 1901 - 05/27/22 0700 05/27/22 0701 - 05/27/22 1900 05/27/22 1901 - 05/28/22 0700 05/28/22 0701 - 05/28/22 1254  Closed System Drain Right RLQ  14 Fr.  60 40 50     Interval imaging/drain manipulation:  None  Current examination: Flushes/aspirates easily.  Insertion site unremarkable. 90 mL output last 24 hr  Plan: Continue TID flushes with 5 cc NS. Record output Q shift. Dressing changes QD or PRN if soiled.  Call IR APP or on call IR MD if difficulty flushing or sudden change in drain output.  Repeat imaging/possible drain injection once output < 10 mL/QD (excluding flush material.)  Discharge planning: Please contact IR APP or on call IR MD prior to patient d/c to ensure appropriate follow up plans are in place. Typically patient will follow up with IR clinic 10-14 days post d/c for repeat imaging/possible drain injection. IR scheduler will contact patient with date/time of appointment. Patient will need to flush drain QD with 5 cc NS, record output QD, dressing changes every 2-3 days or earlier if soiled.  Will place IR outpatient drain order.  IR will continue to follow - please call with questions or concerns.

## 2022-05-28 NOTE — Patient Care Conference (Signed)
Inpatient RehabilitationTeam Conference and Plan of Care Update Date: 05/28/2022   Time: 12:20 PM    Patient Name: Gregory [name]      Medical Record Number: [id number]  Date of Birth: July 28, 1961 Sex: Male         Room/Bed: 4E31V/4M08Q-76 Payor Info: Payor: [employer] / Plan: [employer] / Product Type: *No Product type* /    Admit Date/Time:  05/22/2022  3:47 PM  Primary Diagnosis:  [facility] Problems: Principal Problem:   Debility Active Problems:   Postprocedural intraabdominal abscess    Expected Discharge Date: Expected Discharge Date: 06/06/22  Team Members Present: Physician leading conference: Dr. Jennye Boroughs Social Worker Present: Erlene Quan, BSW Nurse Present: Dorien Chihuahua, RN PT Present: Excell Seltzer, PT OT Present: Meriel Pica, OT PPS Coordinator present : Gunnar Fusi, SLP     Current Status/Progress Goal Weekly Team Focus  Bowel/Bladder   Continent of B/B.  Remain continent  Assist with toileting as needed.   Swallow/Nutrition/ Hydration             ADL's   min - mod UB, mod -max LB. CGA sit to stand, min transfers. limited mobility of B shoulders  supervision with bathing, toileting, toilet transfers; mod I dressing, standing balance.  ADLs. strengthening, endurance, pt education   Mobility   CGA to minA bed mobilty, CGA transfers, gait CGA up to 71f with RW, decreased enduance  mod I transfers, supervision ambulation and stairs  BLE strengthening, endurance, standing tolerance, balance, d/c planning   Communication             Safety/Cognition/ Behavioral Observations            Pain   Pain of 8/10 to lower back, Prn Oxy and robaxin being taken.  Pain <3/10  Assess Qshift and prn   Skin   Abdominal incison OA. JP drain in place.  Remainder of skin to remain intact  Assess Qshift and prn     Discharge Planning[facility]  [facility]   Team Discussion: Patient with  debility, right shoulder issues and poor endurance. JP drain with large amount of drainage noted. Patient with pain in low back; addressed.  MD monitoring daily weights and edema. Patient also with anxiety and thoughts of impending doom; fearful that defibrillator will fire again.  Patient on target to meet rehab goals: Poor endurance slows progress but able to ambulate up to 630 with CGA.   *See Care Plan and progress notes for long and short-term goals.   Revisions to Treatment Plan:  N/A   Teaching Needs: Heart healthy diet, medications, secondary risk management, skin care/drain care - flushes, transfers, etc  Current Barriers to Discharge: Decreased caregiver support and home environment  Possible Resolutions to Barriers: Family education     Medical Summary Current Status: sepsis, intra-abdominal absces, drain, CAD, Heart failure, edema, HTN, anxiety  Barriers to Discharge: Medical stability;Home enviroment access/layout  Barriers to Discharge Comments: sepsis, intra-abdominal absces, drain, CAD, Heart failure, edema, HTN, anxiety,anemia Possible Resolutions to [employer]: drain management, follow labs, monitor BP, monitor edema and continue lasix   Continued Need for Acute Rehabilitation Level of Care: The patient requires daily medical management by a physician with specialized training in physical medicine and rehabilitation for the following reasons: Direction of a multidisciplinary physical rehabilitation program to maximize functional independence : Yes Medical management of patient stability for increased activity during participation in an intensive rehabilitation regime.: Yes  I attest that I was present, lead the team conference, and concur with the assessment and plan of the team.   Dorien Chihuahua B 05/28/2022, 3:23 PM

## 2022-05-28 NOTE — Progress Notes (Signed)
PROGRESS NOTE   Subjective/Complaints: Reports some pain around his shoulder blade on the Right making it difficult to lift his R shoulder.   ROS: Negative for fever, chills, SOB, CP, dizziness, N/V/C/D.  Positive for bilateral lower leg swelling. R scapular pain.    Objective:   No results found. Recent Labs    05/26/22 0558 05/28/22 0512  WBC 2.9* 3.2*  HGB 10.7* 10.6*  HCT 35.9* 35.7*  PLT 280 306    Recent Labs    05/26/22 0558 05/28/22 0512  NA 136 136  K 3.7 3.6  CL 104 103  CO2 26 28  GLUCOSE 100* 99  BUN 9 8  CREATININE 0.84 0.75  CALCIUM 7.2* 7.7*     Intake/Output Summary (Last 24 hours) at 05/28/2022 0800 Last data filed at 05/28/2022 6629 Gross per 24 hour  Intake 420 ml  Output 1890 ml  Net -1470 ml         Physical Exam: Vital Signs Blood pressure (!) 148/96, pulse 73, temperature 97.7 F (36.5 C), resp. rate 16, height '6\' 6"'$  (1.981 m), weight 98.7 kg, SpO2 96 %.  Physical Exam    General: Alert and oriented x 3, No apparent distress HEENT: Head is normocephalic, atraumatic, PERRLA, EOMI bilateral, sclera anicteric, oral mucosa pink and moist, dentition intact, ext ear canals clear,  Note: Both eyes have chronic fatty deposits on lateral aspects of eye not interfering with vision.  Left > Right. Neck: Supple without JVD or lymphadenopathy Heart: Reg rate and rhythm. No murmurs rubs or gallops Chest: CTA bilaterally without wheezes, rales, or rhonchi; no distress Abdomen: Soft, non-tender, non-distended, bowel sounds positive. Has bandage on R side of abd with JP drain, with moderate amount purulent drainage. Extremities: No clubbing, cyanosis. Pulses are 2+. 2+ LE edema Psych: Pt's affect is appropriate. Pt is cooperative Skin: Warm and dry. Small area superficial skin breakdown left sacral area.  Skin tear R buttock, nickel sized. Rt forearm reddend from previous IV. Neuro: Alert  and oriented x3, CN II-XII intact, no focal deficits. Sensory: Intact to LT all 4 extremities Musculoskeletal: Neck supple, cervical back supple. No tenderness. Lt arm forearm to mid-upper arm non-pitting 2+ bilateral LE's 2+ pitting edema to knees bilateral, appears a little improved from yesterday Motor: 5-/5 throughout except LLE 4+/5 and 4/5 R shoulder likely related to pain, tenderness around R medical scapula   Assessment/Plan: 1. Functional deficits which require 3+ hours per day of interdisciplinary therapy in a comprehensive inpatient rehab setting. Physiatrist is providing close team supervision and 24 hour management of active medical problems listed below. Physiatrist and rehab team continue to assess barriers to discharge/monitor patient progress toward functional and medical goals  Care Tool:  Bathing    Body parts bathed by patient: Right arm, Left arm, Chest, Abdomen, Right upper leg, Left upper leg, Face   Body parts bathed by helper: Front perineal area, Buttocks, Right lower leg, Left lower leg     Bathing assist Assist Level: Moderate Assistance - Patient 50 - 74%     Upper Body Dressing/Undressing Upper body dressing   What is the patient wearing?: Pull over shirt    Upper  body assist Assist Level: Moderate Assistance - Patient 50 - 74%    Lower Body Dressing/Undressing Lower body dressing      What is the patient wearing?: Underwear/pull up     Lower body assist Assist for lower body dressing: Maximal Assistance - Patient 25 - 49%     Toileting Toileting    Toileting assist Assist for toileting: Moderate Assistance - Patient 50 - 74%     Transfers Chair/bed transfer  Transfers assist     Chair/bed transfer assist level: Moderate Assistance - Patient 50 - 74%     Locomotion Ambulation   Ambulation assist      Assist level: Minimal Assistance - Patient > 75% Assistive device: Walker-rolling Max distance: 40   Walk 10 feet  activity   Assist     Assist level: Minimal Assistance - Patient > 75% Assistive device: Walker-rolling   Walk 50 feet activity   Assist Walk 50 feet with 2 turns activity did not occur: Safety/medical concerns         Walk 150 feet activity   Assist Walk 150 feet activity did not occur: Safety/medical concerns         Walk 10 feet on uneven surface  activity   Assist Walk 10 feet on uneven surfaces activity did not occur: Safety/medical concerns         Wheelchair     Assist Is the patient using a wheelchair?: Yes Type of Wheelchair: Manual    Wheelchair assist level: Minimal Assistance - Patient > 75% Max wheelchair distance: 100    Wheelchair 50 feet with 2 turns activity    Assist        Assist Level: Minimal Assistance - Patient > 75%   Wheelchair 150 feet activity     Assist      Assist Level: Moderate Assistance - Patient 50 - 74%   Blood pressure (!) 148/96, pulse 73, temperature 97.7 F (36.5 C), resp. rate 16, height '6\' 6"'$  (1.981 m), weight 98.7 kg, SpO2 96 %.  Medical Problem List and Plan: 1. Functional deficits secondary to sepsis, intra-abdominal abscess status post percutaneous drain and recent colon surgery             -patient may  shower if covers JP drain site             -ELOS/Goals: 10-12 days mod I to intermittent supervision             -Continue CIR, continue  PT and OT  -Continue to work on endurance with therapy  -Team conference today 2.  Antithrombotics: -DVT/anticoagulation:  Pharmaceutical: Other (comment) Eliquis 5 mg twice daily             -antiplatelet therapy: None 3. Pain Management: Tylenol, Percocet as needed 4. Mood: LCSW to evaluate and provide emotional support             -antipsychotic agents: n/a 5. Neuropsych: This patient is capable of making decisions on his own behalf. 6. Skin/Wound Care: Routine skin care checks             --routine drain care 7. Fluids/Electrolytes/Nutrition:  Routine ins and outs and follow-up chemistries 8: Intraabdominal abscess s/p perc drain placement- pt knows might go home with drain             --plan Augmentin for 3-4 weeks per ID             -Drain was placed 5/27, exchanged 5/31 by IR             --  plan follow-up CT 6/12             --continue monitoring drain output; follow-up IR drain clinic  -Drain output appears to be gradually decreasing 9: Colon cancer s/p right hemicolectomy 5/5 with mets to lymph nodes             --follow-up with Dr. Burr Medico; PET scan pending- outpatient? 10: CAD/prior PCI to LAD             --cath on 5/4-stable continue medical management             --continue Plavix and aspirin             -Denies chest pain 11: Recurrent VT/prior ACID: continue mexiletine and amiodorone             --keep K+ > 4.0; Mg > 2.0             --follow-up with Dr. Caryl Comes 12: Chronic sys and dia HF: continue [place]; Lasix              --daily weight             --follow-up Dr. Sallyanne Kuster             -Continue lasix '40mg'$  BID 6/11 Noted pitting edema +2 on bilateral lower extremities mostly lower legs above ankle.  Per nursing legs were taut overnight. Increased Lasix from 40 mg to 80 mg BID and added 40 mEq K-Dur, trend K+ with qMonday labs. Currently at 3.6, prior to increasing Lasix dose. -Placed in-pt consult to cardiology for Monday follow up on fluid volume status, changes with Lasix, K-dur -6/14 K+ 3.6 today, stable, continue to follow, edema slightly improved     Latest Ref Rng & Units 05/28/2022    5:12 AM 05/26/2022    5:58 AM 05/23/2022    6:08 AM  BMP  Glucose 70 - 99 mg/dL 99  100  82   BUN 8 - 23 mg/dL '8  9  11   '$ Creatinine 0.61 - 1.24 mg/dL 0.75  0.84  0.85   Sodium 135 - 145 mmol/L 136  136  136   Potassium 3.5 - 5.1 mmol/L 3.6  3.7  3.6   Chloride 98 - 111 mmol/L 103  104  101   CO2 22 - 32 mmol/L '28  26  28   '$ Calcium 8.9 - 10.3 mg/dL 7.7  7.2  8.0    13: Paroxysmal AF: continue Eliquis 14: Left brachial DVT:  continue Eliquis 15: Hypothyroidism; continue Synthroid 16: Hypertension: takes metoprolol and entresto at home.              -He was restarted on entresto, monitor BP, was discussed with cardiology prior to starting 6/12 BP continues to be overall well controlled, continue to monitor, consider restart BB -BP overall well controlled, continue to monitor    05/28/2022    5:00 AM 05/28/2022    4:32 AM 05/27/2022    7:26 PM  Vitals with BMI  Weight 217 lbs 10 oz    BMI 94.70    Systolic  962 836  Diastolic  96 90  Pulse  73 74    17: Seizure disorder: no recent seizure activity; continue Keppra 18: OSA: continue CPAP 19: DM: has been on regular diet without need for insulin coverage; A1c = 5.1             --d/c SSI 20: Hyperlipidemia: continue Zetia, Lipitor 21. Cellulitis? R forearm- already on  Unasyn- should cover- will monitor 6/10 continue to monitor R forearm, already on Unasyn 6/11 Reddened area on R forearm, but skin intact. 22. Anemia             -5/14 anemia stable with HGB 10.6 23. Hypoalbuminemia             -Appears to be eating more of his meals, discussed protein intake, continue to follow 24. Anxiety 6/11 Per nursing and pt, he is feeling very anxious today with concerns about medical prognosis. Ordered low dose 0.25 mg Xanax prn for mild anxiety, prn and Neuropsych. evaluation for Monday. 25. R Scapula pain with Rhomboid tenderness  -Therapy completed manual trigger point release, will continue to monitor   LOS: 6 days A FACE TO FACE EVALUATION WAS PERFORMED  Jennye Boroughs 05/28/2022, 8:00 AM

## 2022-05-28 NOTE — Progress Notes (Signed)
Occupational Therapy Session Note  Patient Details  Name: Gregory [name] MRN: [id number] Date of Birth: 1961/11/06  Today's Date: 05/28/2022 OT Individual Time: 8882-8003 OT Individual Time Calculation (min): 60 min    Short Term Goals: Week 1:  OT Short Term Goal 1 (Week 1): Pt will donn LB with AE PRN with mod A OT Short Term Goal 2 (Week 1): Pt will tolerate stance position for >2 mins to promote standing endurance needed for self-care tasks OT Short Term Goal 3 (Week 1): Pt will complete short ambulatory transfer to toilet with min A using LRAD  Skilled Therapeutic Interventions/Progress Updates:    Pt received in bed and agreeable to getting started in therapy.  From supine, he did a few minutes of LE AROM ex to warm up. Pt sat to EOB with CGA with cues to fully roll to his R side and to tuck his knees toward his chest.  Once sitting, pt felt slightly dizzy. Sat until symptoms subsided.  Knee ext and calf raises in sitting to prep legs for standing. Cues to scoot forward so his feet are positioned well for standing.  Sit to stand to RW  CGA.  Pt stood for 30 sec and then marched in place for 30 sec to ensure he felt well enough to ambulate to chair at sink. Sat in wc to doff shirt, bathe and don shirt but needed min - mod A due to painful R shoulder.  Today pt is having great difficulty lifting and using R arm due to pain and weakness.  On eval, therapist said R arm was [facility]. Pt states this pain and weakness has progressively getting worse over the last few days.  He is only able to lift arm to 120 degrees. His scapular musculature is weak with a slight subluxation.  Unclear if that is premorbid.   Discussed with his MD during pt rounding, pt also seems to be very sensitive to touch on medial border of scapula with trigger points in rhomboids.  This could be contributing to his difficulty lifting his arm.  Manual trigger point release with MFR to rhomboids. Pt very sensitive but  able to handle the pressure. During this time discussed the anxiety he has been experiencing and what pt feels it is related to.  Hand off to PT.       Therapy Documentation Precautions:  Precautions Precautions: Fall Precaution Comments: watch O2 and HR, jp drain Restrictions Weight Bearing Restrictions: No  Vital Signs: Therapy Vitals Temp: 97.7 F (36.5 C) Pulse Rate: 73 Resp: 16 BP: (!) 148/96 Patient Position (if appropriate): Lying Oxygen Therapy SpO2: 96 % O2 Device: Room Air Pain:  R shoulder 5/10   Therapy/Group: Individual Therapy  [place]ley 05/28/2022, 8:30 AM

## 2022-05-28 NOTE — Progress Notes (Addendum)
Patient ID: Gregory [name], male   DOB: 1961/04/23, 61 y.o.   MRN: [id number]  This SW covering for primary SW, [place].   SW received updates about pt being concerned about appeal process.  1532-SW left message for [facility] CM- [name] Murguia  [phone]) to discuss appeal process.   [name] spoke with pt to discuss above. SW explained once more information is available will provide. Pt would like to move forward with suggested d/c date due to his knees and legs not being strong enough.   1543-SW received call from [name] reporting to call utilization review #[phone]/IHWTUU ID# [phone] and to ask for utilization review physician- Renold Genta to discuss further. SW spoke with representative to discuss extension for patient. States SW will need to speak with the adjuster to discuss further. Also states since this is a [place] case will and will only be given a year from date of approval.  States adjuster is [name] spoke to [name] again to share updates. States she will call and get better insight and will f/u with [name], MSW, [place] Office: [phone] Cell: [phone] Fax: [phone]

## 2022-05-29 ENCOUNTER — Inpatient Hospital Stay (HOSPITAL_COMMUNITY): Payer: PRIVATE HEALTH INSURANCE

## 2022-05-29 MED ORDER — DICLOFENAC SODIUM 1 % EX GEL
2.0000 g | Freq: Four times a day (QID) | CUTANEOUS | Status: DC
  Administered 2022-05-29 – 2022-06-06 (×19): 2 g via TOPICAL
  Filled 2022-05-29: qty 100

## 2022-05-29 NOTE — Progress Notes (Signed)
Patient refused CPAP tonight 

## 2022-05-29 NOTE — Progress Notes (Signed)
PROGRESS NOTE   Subjective/Complaints:  Continue pain and weakness in R shoulder. Reports this has been present since his admission.   ROS: Negative for fever, chills, SOB, CP, dizziness, N/V/C/D.  No HA, Positive for bilateral lower leg swelling. R scapular pain.    Objective:   DG Shoulder Right Port  Result Date: 05/29/2022 CLINICAL DATA:  Pain. EXAM: RIGHT SHOULDER - 1 VIEW COMPARISON:  None Available. FINDINGS: There is diffuse decreased bone mineralization. Severe glenohumeral joint space narrowing with bone-on-bone contact, subchondral sclerosis, and large inferior humeral head-neck junction degenerative osteophytosis. Moderate medial humeral head and glenoid fossa cortical flattening/remodeling. Small superior acromioclavicular joint ossicle. No significant joint space narrowing. No acute fracture is seen. No dislocation. Partial visualization of AICD. IMPRESSION: Severe glenohumeral osteoarthritis. Electronically Signed   By: Yvonne Kendall M.D.   On: 05/29/2022 12:58   Recent Labs    05/28/22 0512  WBC 3.2*  HGB 10.6*  HCT 35.7*  PLT 306    Recent Labs    05/28/22 0512  NA 136  K 3.6  CL 103  CO2 28  GLUCOSE 99  BUN 8  CREATININE 0.75  CALCIUM 7.7*     Intake/Output Summary (Last 24 hours) at 05/29/2022 1356 Last data filed at 05/29/2022 1311 Gross per 24 hour  Intake 135 ml  Output 2830 ml  Net -2695 ml         Physical Exam: Vital Signs Blood pressure (!) 137/96, pulse 75, temperature 98 F (36.7 C), resp. rate 16, height '6\' 6"'$  (1.981 m), weight 96.2 kg, SpO2 96 %.  Physical Exam    General: Alert and oriented x 3, No apparent distress HEENT: Head is normocephalic, atraumatic, PERRLA, EOMI bilateral, sclera anicteric, oral mucosa pink and moist, dentition intact, ext ear canals clear,  Note: Both eyes have chronic fatty deposits on lateral aspects of eye not interfering with vision.  Left >  Right. Neck: Supple without JVD or lymphadenopathy Heart: Reg rate and rhythm. No murmurs rubs or gallops Chest: CTA bilaterally without wheezes, rales, or rhonchi; no distress Abdomen: Soft, non-tender, non-distended, bowel sounds positive. Has bandage on R side of abd with JP drain, with moderate amount purulent drainage. Extremities: No clubbing, cyanosis. Pulses are 2+. 2+ LE edema Psych: Pt's affect is appropriate. Pt is cooperative Skin: Warm and dry. Small area superficial skin breakdown left sacral area.  Skin tear R buttock, nickel sized. Rt forearm reddend from previous IV. Neuro: Alert and oriented x3, CN II-XII intact, no focal deficits. Sensory: Intact to LT all 4 extremities Musculoskeletal: Neck supple, cervical back supple. No tenderness. Lt arm forearm to mid-upper arm non-pitting 2+ bilateral LE's 2+ pitting edema to knees bilateral, appears a little improved from yesterday Motor: 5-/5 throughout except LLE 4+/5 and 4/5 R shoulder likely related to pain, tenderness around R medical scapula and posterior shoulder. 4/5 R biceps flexion also likely limited to pain   Assessment/Plan: 1. Functional deficits which require 3+ hours per day of interdisciplinary therapy in a comprehensive inpatient rehab setting. Physiatrist is providing close team supervision and 24 hour management of active medical problems listed below. Physiatrist and rehab team continue to assess barriers to  discharge/monitor patient progress toward functional and medical goals  Care Tool:  Bathing    Body parts bathed by patient: Right arm, Left arm, Chest, Abdomen, Right upper leg, Left upper leg, Face, Front perineal area, Left lower leg, Right lower leg   Body parts bathed by helper: Buttocks     Bathing assist Assist Level: Minimal Assistance - Patient > 75%     Upper Body Dressing/Undressing Upper body dressing   What is the patient wearing?: Pull over shirt    Upper body assist Assist Level:  Contact Guard/Touching assist    Lower Body Dressing/Undressing Lower body dressing      What is the patient wearing?: Pants     Lower body assist Assist for lower body dressing: Supervision/Verbal cueing     Toileting Toileting    Toileting assist Assist for toileting: Minimal Assistance - Patient > 75%     Transfers Chair/bed transfer  Transfers assist     Chair/bed transfer assist level: Contact Guard/Touching assist     Locomotion Ambulation   Ambulation assist      Assist level: Contact Guard/Touching assist Assistive device: Walker-rolling Max distance: 65   Walk 10 feet activity   Assist     Assist level: Contact Guard/Touching assist Assistive device: Walker-rolling   Walk 50 feet activity   Assist Walk 50 feet with 2 turns activity did not occur: Safety/medical concerns  Assist level: Contact Guard/Touching assist Assistive device: Walker-rolling    Walk 150 feet activity   Assist Walk 150 feet activity did not occur: Safety/medical concerns         Walk 10 feet on uneven surface  activity   Assist Walk 10 feet on uneven surfaces activity did not occur: Safety/medical concerns         Wheelchair     Assist Is the patient using a wheelchair?: Yes Type of Wheelchair: Manual    Wheelchair assist level: Minimal Assistance - Patient > 75% Max wheelchair distance: 100    Wheelchair 50 feet with 2 turns activity    Assist        Assist Level: Minimal Assistance - Patient > 75%   Wheelchair 150 feet activity     Assist      Assist Level: Moderate Assistance - Patient 50 - 74%   Blood pressure (!) 137/96, pulse 75, temperature 98 F (36.7 C), resp. rate 16, height '6\' 6"'$  (1.981 m), weight 96.2 kg, SpO2 96 %.  Medical Problem List and Plan: 1. Functional deficits secondary to sepsis, intra-abdominal abscess status post percutaneous drain and recent colon surgery             -patient may  shower if  covers JP drain site             -ELOS/Goals: 10-12 days mod I to intermittent supervision             -Continue CIR, continue  PT and OT  -Continue to work on endurance with therapy  -est discharge 6/23 2.  Antithrombotics: -DVT/anticoagulation:  Pharmaceutical: Other (comment) Eliquis 5 mg twice daily             -antiplatelet therapy: None 3. Pain Management: Tylenol, Percocet as needed 4. Mood: LCSW to evaluate and provide emotional support             -antipsychotic agents: n/a 5. Neuropsych: This patient is capable of making decisions on his own behalf. 6. Skin/Wound Care: Routine skin care checks             --  routine drain care 7. Fluids/Electrolytes/Nutrition: Routine ins and outs and follow-up chemistries 8: Intraabdominal abscess s/p perc drain placement- pt knows might go home with drain             --plan Augmentin for 3-4 weeks per ID             -Drain was placed 5/27, exchanged 5/31 by IR             --plan follow-up CT 6/12             --continue monitoring drain output; follow-up IR drain clinic  -Drain output appears to be gradually decreasing 9: Colon cancer s/p right hemicolectomy 5/5 with mets to lymph nodes             --follow-up with Dr. Burr Medico; PET scan pending- outpatient? 10: CAD/prior PCI to LAD             --cath on 5/4-stable continue medical management             --continue Plavix and aspirin 11: Recurrent VT/prior ACID: continue mexiletine and amiodorone             --keep K+ > 4.0; Mg > 2.0             --follow-up with Dr. Caryl Comes 12: Chronic sys and dia HF: continue [place]; Lasix              --daily weight             --follow-up Dr. Sallyanne Kuster             -Continue lasix '40mg'$  BID 6/11 Noted pitting edema +2 on bilateral lower extremities mostly lower legs above ankle.  Per nursing legs were taut overnight. Increased Lasix from 40 mg to 80 mg BID and added 40 mEq K-Dur, trend K+ with qMonday labs. Currently at 3.6, prior to increasing Lasix  dose. -Placed in-pt consult to cardiology for Monday follow up on fluid volume status, changes with Lasix, K-dur -6/14 K+ 3.6 today, stable, continue to follow, edema slightly improved -labs tomorrow     Latest Ref Rng & Units 05/28/2022    5:12 AM 05/26/2022    5:58 AM 05/23/2022    6:08 AM  BMP  Glucose 70 - 99 mg/dL 99  100  82   BUN 8 - 23 mg/dL '8  9  11   '$ Creatinine 0.61 - 1.24 mg/dL 0.75  0.84  0.85   Sodium 135 - 145 mmol/L 136  136  136   Potassium 3.5 - 5.1 mmol/L 3.6  3.7  3.6   Chloride 98 - 111 mmol/L 103  104  101   CO2 22 - 32 mmol/L '28  26  28   '$ Calcium 8.9 - 10.3 mg/dL 7.7  7.2  8.0    13: Paroxysmal AF: continue Eliquis 14: Left brachial DVT: continue Eliquis 15: Hypothyroidism; continue Synthroid 16: Hypertension: takes metoprolol and entresto at home.              -He was restarted on entresto, monitor BP, was discussed with cardiology prior to starting 6/12 BP continues to be overall well controlled, continue to monitor, consider restart BB -BP stable overall, continue to follow tend    05/29/2022    3:08 AM 05/28/2022    8:02 PM 05/28/2022    3:01 PM  Vitals with BMI  Weight 212 lbs 1 oz    BMI 40.98    Systolic 119 147 829  Diastolic  96 85 90  Pulse 75 72 77    17: Seizure disorder: no recent seizure activity; continue Keppra 18: OSA: continue CPAP 19: DM: has been on regular diet without need for insulin coverage; A1c = 5.1             --d/c SSI 20: Hyperlipidemia: continue Zetia, Lipitor 21. Cellulitis? R forearm- already on Unasyn- should cover- will monitor 6/10 continue to monitor R forearm, already on Unasyn 6/11 Reddened area on R forearm, but skin intact. 22. Anemia             -5/14 anemia stable with HGB 10.6  -labs tomorrow 23. Hypoalbuminemia             -Appears to be eating more of his meals, discussed protein intake, continue to follow 24. Anxiety 6/11 Per nursing and pt, he is feeling very anxious today with concerns about medical  prognosis. Ordered low dose 0.25 mg Xanax prn for mild anxiety, prn and Neuropsych. evaluation for Monday. 25. R Scapula pain with Rhomboid tenderness  -Therapy completed manual trigger point release  -6/15 xray shoulder ordered- severe OA, Voltaren gel ordered   LOS: 7 days A FACE TO FACE EVALUATION WAS PERFORMED  Jennye Boroughs 05/29/2022, 1:56 PM

## 2022-05-29 NOTE — Progress Notes (Signed)
Physical Therapy Session Note  Patient Details  Name: Gregory [name] MRN: [id number] Date of Birth: [birth year]  Today's Date: 05/29/2022 PT Individual Time: 0803-0909 PT Individual Time Calculation (min): 66 min   Short Term Goals: Week 1:  PT Short Term Goal 1 (Week 1): Pt will perform bed mobility with min PT Short Term Goal 2 (Week 1): Pt will transfer to [facility] with CGA PT Short Term Goal 3 (Week 1): Pt will ascend 4steps with min assist PT Short Term Goal 4 (Week 1): Pt will ambulate 35f with min assist PT Short Term Goal 5 (Week 1): Pt will propell WC 1039fwith supervision assist  Skilled Therapeutic Interventions/Progress Updates: Pt presented in bed agreeable to therapy. Pt somewhat anxious due to ins concerns. Provided active listening and support and encouraged pt to try to do best in therapies. Pt performed supine to sit with CGA and increased time/effort with use of bed features. Performed ambulatory transfer to sink from heightened bed and CGA. Performed oral hygiene at sink w/c level for energy conservation. Pt then transported to rehab gym for energy conservation. Participated in step ups to 6in step x 5 bilaterally. Pt was then able to ascend/descend x 4 steps with B rails and CGA! Pt did require increased time for recovery after activity. Participated in standing tolerance activity performing ted board task. Pt was able to tolerate ~2 min max standing with pt requiring mod cues for correct placement of pegs. Pt required 3 seated rest breaks to complete task. PT transported back to room at end of session and performed ambulatory transfer to bed CGA and was light minA for sit to supine with bed flat. Pt able to reposition self in bed independently. Pt left in bed at end of session with bed alarm on, call bell within reach and needs met.      Therapy Documentation Precautions:  Precautions Precautions: Fall Precaution Comments: watch O2 and HR, jp  drain Restrictions Weight Bearing Restrictions: No General:   Vital Signs:  Pain:   Mobility:   Locomotion :    Trunk/Postural Assessment :    Balance:   Exercises:   Other Treatments:      Therapy/Group: Individual Therapy  Nadya Hopwood 05/29/2022, 12:31 PM

## 2022-05-29 NOTE — Progress Notes (Signed)
Patient ID: Gregory [name], male   DOB: [birth year], 61 y.o.   MRN: [id number]  SW received message from [employer]  [phone]) stating that they would need clinicals to support an extension today in order to not have to resubmit a new auth request and to email clinicals to: [email or web address].  [id number] spoke with [name] to discuss her message and informed will email clinicals. Clinicals sent to above email address.   Loralee Pacas, MSW, [place] Office: [phone] Cell: [phone] Fax: [phone]

## 2022-05-29 NOTE — Plan of Care (Signed)
  Problem: RH Bathing Goal: LTG Patient will bathe all body parts with assist levels (OT) Description: LTG: Patient will bathe all body parts with assist levels (OT) Flowsheets (Taken 05/29/2022 1251) LTG: Pt will perform bathing with assistance level/cueing: (LTG downgraded due to B shoulder AROM limitations decreasing his ability to engage in task fully.) Minimal Assistance - Patient > 75% Note: LTG downgraded due to B shoulder AROM limitations decreasing his ability to engage in task fully.   Problem: RH Dressing Goal: LTG Patient will perform upper body dressing (OT) Description: LTG Patient will perform upper body dressing with assist, with/without cues (OT). Flowsheets (Taken 05/29/2022 1251) LTG: Pt will perform upper body dressing with assistance level of: (LTG downgraded due to B shoulder AROM limitations decreasing his ability to engage in task fully.) Set up assist Note: LTG downgraded due to B shoulder AROM limitations decreasing his ability to engage in task fully. Goal: LTG Patient will perform lower body dressing w/assist (OT) Description: LTG: Patient will perform lower body dressing with assist, with/without cues in positioning using equipment (OT) Flowsheets (Taken 05/29/2022 1251) LTG: Pt will perform lower body dressing with assistance level of: (LTG downgraded due to B shoulder AROM limitations decreasing his ability to engage in task fully.) Minimal Assistance - Patient > 75% Note: LTG downgraded due to B shoulder AROM limitations decreasing his ability to engage in task fully.   Problem: RH Toileting Goal: LTG Patient will perform toileting task (3/3 steps) with assistance level (OT) Description: LTG: Patient will perform toileting task (3/3 steps) with assistance level (OT)  Flowsheets (Taken 05/29/2022 1251) LTG: Pt will perform toileting task (3/3 steps) with assistance level: (LTG downgraded due to B shoulder AROM limitations decreasing his ability to engage in task  fully.) Minimal Assistance - Patient > 75% Note: LTG downgraded due to B shoulder AROM limitations decreasing his ability to engage in task fully.

## 2022-05-29 NOTE — Progress Notes (Signed)
Patient educated on closed drainage system and dressing change. Patient states that his son will be emptying the drainage bulb and dressing. Son unable to make it to be educated this evening, however he is coming in tomorrow evening for education. Patient watched writer empty and measure drainage, squeeze bulb and insert plug.

## 2022-05-29 NOTE — Progress Notes (Signed)
Physical Therapy Session Note  Patient Details  Name: Gregory [name] MRN: [id number] Date of Birth: [birth year]  Today's Date: 05/29/2022 PT Individual Time: 1300-1400 PT Individual Time Calculation (min): 60 min   Short Term Goals: Week 1:  PT Short Term Goal 1 (Week 1): Pt will perform bed mobility with min PT Short Term Goal 2 (Week 1): Pt will transfer to [facility] with CGA PT Short Term Goal 3 (Week 1): Pt will ascend 4steps with min assist PT Short Term Goal 4 (Week 1): Pt will ambulate 23f with min assist PT Short Term Goal 5 (Week 1): Pt will propell WC 1054fwith supervision assist Week 2:    Week 3:     Skilled Therapeutic Interventions/Progress Updates:    Supine to sit mod I. stand pivot transfer to wc w/RW and supervision. Stood 2-3 min thile therapist assisted w/tying sweatpants, cga w/RW  Wc propulsion 10091fod I but w/additional time.  Stairs: Ascends/descends 4 stairs w/2 rails w/cga, step to gait.  Relies heavily on Ues.    Repeated Sit to stand from wc x 7, x 5 w/cga  Gait 120f30fRW w/close supervision  Lateral partial lunges alternating x 10 W/red theraband resistance to abd/at thighs - Sit to stand + sidestep + stand to sit repeated at mat 2X to L/2X to R  Gait 100ft108fa, RW. stand pivot transfer to bed w/RW and close supervision. Sit to supine w/mod assist for LE management. Pt left supine w/rails up x 3, alarm set, bed in lowest position, and needs in reach.   Therapy Documentation Precautions:  Precautions Precautions: Fall Precaution Comments: watch O2 and HR, jp drain Restrictions Weight Bearing Restrictions: No   Therapy/Group: Individual Therapy BarbaCallie Fielding  Gar[place]/2023, 2:02 PM

## 2022-05-29 NOTE — Progress Notes (Addendum)
Patient ID: [name], male   DOB: 05-04-1961, 61 y.o.   MRN: [id number] Met with the patient to review care, medications and diet. Patient had expressed concerns about HH diet restrictions, edema of LEs on a regular diet. Reviewed secondary risks including DM (A1 C 5.1), HTN, HLD (Lipoprotein 278; goal <75), HF, A-fib and protein needs. Reviewed dietary modification recommendations and given information on a heart failure eating plan, options for salt including herbs, spices and seasonings, daily weight check and zone tool on how to proceed, notes on when to contact the MD along with instructions on drain care.  Patient with right shoulder issues, limited mobility, pain, etc, and drain on right side; reports son will complete dressing changes and flushes as ordered. Continue to follow along to discharge to address educational needs to facilitate preparation for discharge. Reinforced communication of concerns noted regarding discharge with SW post team conference and follow up by SW with insurance on recommended discharge date. [name]

## 2022-05-29 NOTE — Progress Notes (Signed)
Patient ID: Gregory [name], male   DOB: 1961-06-28, 61 y.o.   MRN: [id number]  Team Conference Report to Patient/Family  Team Conference discussion was reviewed with the patient and caregiver, including goals, any changes in plan of care and target discharge date.  Patient and caregiver express understanding and are in agreement.  The patient has a target discharge date of 06/06/22.  Sw met with patient on 05/28/2022 and provided team conference updates. Sw informed that patient will requires an extension with workers comp to remain on CIR. Clinicals sent.   Dyanne Iha 05/29/2022, 12:50 PM

## 2022-05-29 NOTE — Progress Notes (Signed)
Pt refused CPAP tonight. Says he will ask his son to bring his home unit.

## 2022-05-29 NOTE — Progress Notes (Signed)
Occupational Therapy Session Note  Patient Details  Name: Gregory [name] MRN: [id number] Date of Birth: 05-Jul-1961  Today's Date: 05/29/2022 OT Individual Time: 1045-1200 OT Individual Time Calculation (min): 75 min    Short Term Goals: Week 1:  OT Short Term Goal 1 (Week 1): Pt will donn LB with AE PRN with mod A OT Short Term Goal 2 (Week 1): Pt will tolerate stance position for >2 mins to promote standing endurance needed for self-care tasks OT Short Term Goal 3 (Week 1): Pt will complete short ambulatory transfer to toilet with min A using LRAD  Skilled Therapeutic Interventions/Progress Updates:    Pt received in bed agreeable to therapy.  Pt stated he continues to have difficulty getting out of bed. Reminded pt of technique I have taught him 2x before to fully roll onto R side before pushing up to sit. He was able to do this with S.  Light CGA to stand to RW and ambulate to toilet.  Pt able to manage clothing but unable to cleanse his bottom post BM due to difficulty using R arm and his worry about the 2 sores on his bottom.  Clarified with pt that his son would help him do this at home should he still need A at discharge, pt said yes.    Pt agreeable to a shower.  Obtained a taller shower bench for pt.  Covered drain sites with tegaderm to waterproof. Pt transferred to shower CGA. Pt has B shoulder limitations so used a long handled sponge to reach under arms and to shoulders. He used sponge to reach feet. Able to do lateral lean to wash sides of buttocks.   Pt dried off UB to don shirt with slight A to pull down upper back.  He then transferred back to toilet to don pants S and socks mod A.   Xray coming so returned pt to bed and from bed, assisted pt with donning his TED hose.  Discussed with pt that he has mod I goals set but with this new onset of R shoulder pain and weakness, some ADLs he will need min A with.  Informed pt that I will downgrade a few of his goals as mod I  is not fully realistic. Pt stated that he will not be alone at home.   Pt resting in bed with all needs met.  Therapy Documentation Precautions:  Precautions Precautions: Fall Precaution Comments: watch O2 and HR, jp drain Restrictions Weight Bearing Restrictions: No  Pain: c/o R shoulder pain, RN applied voltaren gel   ADL: ADL Eating: Set up Where Assessed-Eating: Bed level Grooming: Setup Where Assessed-Grooming: Edge of bed Upper Body Bathing: Supervision/safety Where Assessed-Upper Body Bathing: Shower Lower Body Bathing: Minimal assistance Where Assessed-Lower Body Bathing: Shower Upper Body Dressing: Contact guard Where Assessed-Upper Body Dressing: Edge of bed Lower Body Dressing: Moderate assistance Where Assessed-Lower Body Dressing: Edge of bed Toileting: Minimal assistance Where Assessed-Toileting: Other (Comment) (simulated at EOB) Toilet Transfer: Contact guard Toilet Transfer Method: Other (comment) (side step, simulated) Tub/Shower Transfer: Unable to assess Tub/Shower Transfer Method: Unable to assess Gaffer Transfer: Curator Method: Unable to assess [employer]: Grab bars, Transfer tub bench   Therapy/Group: Individual Therapy  Quantavia Frith 05/29/2022, 12:42 PM

## 2022-05-30 DIAGNOSIS — I472 Ventricular tachycardia, unspecified: Secondary | ICD-10-CM

## 2022-05-30 LAB — BASIC METABOLIC PANEL
Anion gap: 8 (ref 5–15)
BUN: 10 mg/dL (ref 8–23)
CO2: 28 mmol/L (ref 22–32)
Calcium: 7.8 mg/dL — ABNORMAL LOW (ref 8.9–10.3)
Chloride: 99 mmol/L (ref 98–111)
Creatinine, Ser: 0.72 mg/dL (ref 0.61–1.24)
GFR, Estimated: >60 mL/min (ref >60)
Glucose, Bld: 100 mg/dL — ABNORMAL HIGH (ref 70–99)
Potassium: 4.1 mmol/L (ref 3.5–5.1)
Sodium: 135 mmol/L (ref 135–145)

## 2022-05-30 LAB — CBC
HCT: 35.9 % — ABNORMAL LOW (ref 39.0–52.0)
Hemoglobin: 10.7 g/dL — ABNORMAL LOW (ref 13.0–17.0)
MCH: 26.2 pg (ref 26.0–34.0)
MCHC: 29.8 g/dL — ABNORMAL LOW (ref 30.0–36.0)
MCV: 87.8 fL (ref 80.0–100.0)
Platelets: 308 10*3/uL (ref 150–400)
RBC: 4.09 MIL/uL — ABNORMAL LOW (ref 4.22–5.81)
RDW: 24.2 % — ABNORMAL HIGH (ref 11.5–15.5)
WBC: 4.8 10*3/uL (ref 4.0–10.5)
nRBC: 0 % (ref 0.0–0.2)

## 2022-05-30 MED ORDER — NYSTATIN 100000 UNIT/GM EX POWD
Freq: Two times a day (BID) | CUTANEOUS | Status: DC
  Administered 2022-06-01: 1 via TOPICAL
  Filled 2022-05-30 (×2): qty 15

## 2022-05-30 NOTE — Progress Notes (Signed)
PROGRESS NOTE   Subjective/Complaints:  Reports some irritation b/l groin.  Appears to be in good mood.   ROS: Negative for fever, chills, SOB, CP, dizziness, N/V/C/D.  No HA or visual changes. Positive for bilateral lower leg swelling. R scapular pain.    Objective:   DG Shoulder Right Port  Result Date: 05/29/2022 CLINICAL DATA:  Pain. EXAM: RIGHT SHOULDER - 1 VIEW COMPARISON:  None Available. FINDINGS: There is diffuse decreased bone mineralization. Severe glenohumeral joint space narrowing with bone-on-bone contact, subchondral sclerosis, and large inferior humeral head-neck junction degenerative osteophytosis. Moderate medial humeral head and glenoid fossa cortical flattening/remodeling. Small superior acromioclavicular joint ossicle. No significant joint space narrowing. No acute fracture is seen. No dislocation. Partial visualization of AICD. IMPRESSION: Severe glenohumeral osteoarthritis. Electronically Signed   By: Yvonne Kendall M.D.   On: 05/29/2022 12:58   Recent Labs    05/28/22 0512 05/30/22 0515  WBC 3.2* 4.8  HGB 10.6* 10.7*  HCT 35.7* 35.9*  PLT 306 308    Recent Labs    05/28/22 0512 05/30/22 0515  NA 136 135  K 3.6 4.1  CL 103 99  CO2 28 28  GLUCOSE 99 100*  BUN 8 10  CREATININE 0.75 0.72  CALCIUM 7.7* 7.8*     Intake/Output Summary (Last 24 hours) at 05/30/2022 1307 Last data filed at 05/30/2022 1017 Gross per 24 hour  Intake 367 ml  Output 1710 ml  Net -1343 ml         Physical Exam: Vital Signs Blood pressure (!) 135/96, pulse 76, temperature 97.8 F (36.6 C), resp. rate 17, height '6\' 6"'$  (1.981 m), weight 96.1 kg, SpO2 96 %.  Physical Exam    General: Alert and oriented x 3, No apparent distress HEENT: Head is normocephalic, atraumatic, PERRLA, EOMI bilateral, sclera anicteric, oral mucosa pink and moist, dentition intact, ext ear canals clear,  Note: Both eyes have chronic fatty  deposits on lateral aspects of eye not interfering with vision.  Left > Right. Neck: Supple without JVD or lymphadenopathy Heart: Reg rate and rhythm. No murmurs rubs or gallops Chest: CTA bilaterally without wheezes, rales, or rhonchi; no distress Abdomen: Soft, non-tender, non-distended, bowel sounds positive. Has bandage on R side of abd with JP drain, with moderate amount purulent drainage. Extremities: No clubbing, cyanosis. Pulses are 2+. 2+ LE edema Psych: Pt's affect is appropriate. Pt is cooperative Skin: Warm and dry. Small area superficial skin breakdown left sacral area.  Skin tear R buttock, nickel sized. Rt forearm reddend from previous IV.  Fungal rash  b/l groin Neuro: Alert and oriented x3, CN II-XII intact, no focal deficits. Sensory: Intact to LT all 4 extremities Musculoskeletal: Neck supple, cervical back supple. No tenderness. Lt arm forearm to mid-upper arm non-pitting 2+ bilateral LE's 2+ pitting edema to knees bilateral, appears a little improved from yesterday Motor: 5-/5 throughout except LLE 4+/5 and 4/5 R shoulder likely related to pain, tenderness around R medical scapula and posterior shoulder. 4/5 R biceps flexion also likely limited to pain   Assessment/Plan: 1. Functional deficits which require 3+ hours per day of interdisciplinary therapy in a comprehensive inpatient rehab setting. Physiatrist is providing  close team supervision and 24 hour management of active medical problems listed below. Physiatrist and rehab team continue to assess barriers to discharge/monitor patient progress toward functional and medical goals  Care Tool:  Bathing    Body parts bathed by patient: Right arm, Left arm, Chest, Abdomen, Right upper leg, Left upper leg, Face, Front perineal area, Left lower leg, Right lower leg   Body parts bathed by helper: Buttocks     Bathing assist Assist Level: Minimal Assistance - Patient > 75%     Upper Body Dressing/Undressing Upper body  dressing   What is the patient wearing?: Pull over shirt    Upper body assist Assist Level: Contact Guard/Touching assist    Lower Body Dressing/Undressing Lower body dressing      What is the patient wearing?: Pants     Lower body assist Assist for lower body dressing: Supervision/Verbal cueing     Toileting Toileting    Toileting assist Assist for toileting: Minimal Assistance - Patient > 75%     Transfers Chair/bed transfer  Transfers assist     Chair/bed transfer assist level: Contact Guard/Touching assist     Locomotion Ambulation   Ambulation assist      Assist level: Contact Guard/Touching assist Assistive device: Walker-rolling Max distance: 65   Walk 10 feet activity   Assist     Assist level: Contact Guard/Touching assist Assistive device: Walker-rolling   Walk 50 feet activity   Assist Walk 50 feet with 2 turns activity did not occur: Safety/medical concerns  Assist level: Contact Guard/Touching assist Assistive device: Walker-rolling    Walk 150 feet activity   Assist Walk 150 feet activity did not occur: Safety/medical concerns         Walk 10 feet on uneven surface  activity   Assist Walk 10 feet on uneven surfaces activity did not occur: Safety/medical concerns         Wheelchair     Assist Is the patient using a wheelchair?: Yes Type of Wheelchair: Manual    Wheelchair assist level: Minimal Assistance - Patient > 75% Max wheelchair distance: 100    Wheelchair 50 feet with 2 turns activity    Assist        Assist Level: Minimal Assistance - Patient > 75%   Wheelchair 150 feet activity     Assist      Assist Level: Moderate Assistance - Patient 50 - 74%   Blood pressure (!) 135/96, pulse 76, temperature 97.8 F (36.6 C), resp. rate 17, height '6\' 6"'$  (1.981 m), weight 96.1 kg, SpO2 96 %.  Medical Problem List and Plan: 1. Functional deficits secondary to sepsis, intra-abdominal abscess  status post percutaneous drain and recent colon surgery             -patient may  shower if covers JP drain site             -ELOS/Goals: 10-12 days mod I to intermittent supervision             -Continue CIR, continue  PT and OT  -Continue to work on endurance with therapy  -est discharge 6/23, mod I Sup 2.  Antithrombotics: -DVT/anticoagulation:  Pharmaceutical: Other (comment) Eliquis 5 mg twice daily             -antiplatelet therapy: None 3. Pain Management: Tylenol, Percocet as needed 4. Mood: LCSW to evaluate and provide emotional support             -antipsychotic agents: n/a  5. Neuropsych: This patient is capable of making decisions on his own behalf. 6. Skin/Wound Care: Routine skin care checks             --routine drain care 7. Fluids/Electrolytes/Nutrition: Routine ins and outs and follow-up chemistries 8: Intraabdominal abscess s/p perc drain placement- pt knows might go home with drain             --plan Augmentin for 3-4 weeks per ID             -Drain was placed 5/27, exchanged 5/31 by IR             --plan follow-up CT 6/12             --continue monitoring drain output; follow-up IR drain clinic  -Drain output appears to be gradually decreasing 9: Colon cancer s/p right hemicolectomy 5/5 with mets to lymph nodes             --follow-up with Dr. Burr Medico; PET scan pending- outpatient? 10: CAD/prior PCI to LAD             --cath on 5/4-stable continue medical management             --continue Plavix and aspirin 11: Recurrent VT/prior ACID: continue mexiletine and amiodorone             --keep K+ > 4.0; Mg > 2.0             --follow-up with Dr. Caryl Comes  -6/15 K+ 4.1 today 12: Chronic sys and dia HF: continue [place]; Lasix              --daily weight             --follow-up Dr. Sallyanne Kuster             -Continue lasix '40mg'$  BID 6/11 Noted pitting edema +2 on bilateral lower extremities mostly lower legs above ankle.  Per nursing legs were taut overnight. Increased Lasix from  40 mg to 80 mg BID and added 40 mEq K-Dur, trend K+ with qMonday labs. Currently at 3.6, prior to increasing Lasix dose. -Placed in-pt consult to cardiology for Monday follow up on fluid volume status, changes with Lasix, K-dur -6/14 K+ 3.6 today, stable, continue to follow, edema slightly improved -6/16 K+ stable at 4.1 continue current treatment     Latest Ref Rng & Units 05/30/2022    5:15 AM 05/28/2022    5:12 AM 05/26/2022    5:58 AM  BMP  Glucose 70 - 99 mg/dL 100  99  100   BUN 8 - 23 mg/dL '10  8  9   '$ Creatinine 0.61 - 1.24 mg/dL 0.72  0.75  0.84   Sodium 135 - 145 mmol/L 135  136  136   Potassium 3.5 - 5.1 mmol/L 4.1  3.6  3.7   Chloride 98 - 111 mmol/L 99  103  104   CO2 22 - 32 mmol/L '28  28  26   '$ Calcium 8.9 - 10.3 mg/dL 7.8  7.7  7.2    13: Paroxysmal AF: continue Eliquis 14: Left brachial DVT: continue Eliquis 15: Hypothyroidism; continue Synthroid 16: Hypertension: takes metoprolol and entresto at home.              -He was restarted on entresto, monitor BP, was discussed with cardiology prior to starting 6/12 BP continues to be overall well controlled, continue to monitor, consider restart BB -BP stable overall, continue to follow tend  05/30/2022    5:25 AM 05/30/2022    5:00 AM 05/29/2022    7:52 PM  Vitals with BMI  Weight  211 lbs 14 oz   BMI  42.10   Systolic 312  811  Diastolic 96  886  Pulse 76  74    17: Seizure disorder: no recent seizure activity; continue Keppra 18: OSA: continue CPAP 19: DM: has been on regular diet without need for insulin coverage; A1c = 5.1             --d/c SSI 20: Hyperlipidemia: continue Zetia, Lipitor 21. Cellulitis? R forearm- already on Unasyn- should cover- will monitor 6/10 continue to monitor R forearm, already on Unasyn 6/11 Reddened area on R forearm, but skin intact. 22. Anemia             -5/14 anemia stable with HGB 10.6  -5/16 HGB stable at 10.7 23. Hypoalbuminemia             -Appears to be eating more of  his meals, discussed protein intake, continue to follow 24. Anxiety 6/11 Per nursing and pt, he is feeling very anxious today with concerns about medical prognosis. Ordered low dose 0.25 mg Xanax prn for mild anxiety, prn and Neuropsych. evaluation for Monday. 25. R Scapula pain with Rhomboid tenderness  -Therapy completed manual trigger point release  -6/15 xray shoulder ordered- severe OA, Voltaren gel ordered  26. Tinea Cruris   -Nystatin powder LOS: 8 days A FACE TO FACE EVALUATION WAS PERFORMED  Jennye Boroughs 05/30/2022, 1:07 PM

## 2022-05-30 NOTE — Progress Notes (Signed)
Occupational Therapy Weekly Progress Note  Patient Details  Name: Gregory [name] MRN: [id number] Date of Birth: 1961-01-16  Beginning of progress report period: May 23, 2022 End of progress report period: May 30, 2022  Today's Date: 05/30/2022 OT Individual Time: 1030-1045 OT Individual Time Calculation (min): 15 min  and Today's Date: 05/30/2022 OT Missed Time: 48 Minutes Missed Time Reason: Unavailable (comment) (pt sitting on toilet)   Patient has met 3 of 3 short term goals.  Pt is making excellent progress with his mobility and his self care but R shoulder pain has limited his ability to full reach requiring increased A with some of his ADLs.  Patient continues to demonstrate the following deficits: muscle weakness and muscle joint tightness, decreased cardiorespiratoy endurance, and decreased standing balance and decreased balance strategies and therefore will continue to benefit from skilled OT intervention to enhance overall performance with BADL.  Patient progressing toward long term goals..  Plan of care revisions: .Marland Kitchen Problem: RH Bathing Goal: LTG Patient will bathe all body parts with assist levels (OT) Description: LTG: Patient will bathe all body parts with assist levels (OT) Flowsheets (Taken 05/29/2022 1251) LTG: Pt will perform bathing with assistance level/cueing: (LTG downgraded due to B shoulder AROM limitations decreasing his ability to engage in task fully.) Minimal Assistance - Patient > 75% Note: LTG downgraded due to B shoulder AROM limitations decreasing his ability to engage in task fully.   Problem: RH Dressing Goal: LTG Patient will perform upper body dressing (OT) Description: LTG Patient will perform upper body dressing with assist, with/without cues (OT). Flowsheets (Taken 05/29/2022 1251) LTG: Pt will perform upper body dressing with assistance level of: (LTG downgraded due to B shoulder AROM limitations decreasing his ability to engage in task  fully.) Set up assist Note: LTG downgraded due to B shoulder AROM limitations decreasing his ability to engage in task fully. Goal: LTG Patient will perform lower body dressing w/assist (OT) Description: LTG: Patient will perform lower body dressing with assist, with/without cues in positioning using equipment (OT) Flowsheets (Taken 05/29/2022 1251) LTG: Pt will perform lower body dressing with assistance level of: (LTG downgraded due to B shoulder AROM limitations decreasing his ability to engage in task fully.) Minimal Assistance - Patient > 75% Note: LTG downgraded due to B shoulder AROM limitations decreasing his ability to engage in task fully.   Problem: RH Toileting Goal: LTG Patient will perform toileting task (3/3 steps) with assistance level (OT) Description: LTG: Patient will perform toileting task (3/3 steps) with assistance level (OT)  Flowsheets (Taken 05/29/2022 1251) LTG: Pt will perform toileting task (3/3 steps) with assistance level: (LTG downgraded due to B shoulder AROM limitations decreasing his ability to engage in task fully.) Minimal Assistance - Patient > 75% Note: LTG downgraded due to B shoulder AROM limitations decreasing  OT Short Term Goals Week 1:  OT Short Term Goal 1 (Week 1): Pt will donn LB with AE PRN with mod A OT Short Term Goal 1 - Progress (Week 1): Met OT Short Term Goal 2 (Week 1): Pt will tolerate stance position for >2 mins to promote standing endurance needed for self-care tasks OT Short Term Goal 2 - Progress (Week 1): Met OT Short Term Goal 3 (Week 1): Pt will complete short ambulatory transfer to toilet with min A using LRAD OT Short Term Goal 3 - Progress (Week 1): Met Week 2:  OT Short Term Goal 1 (Week 2): STGs = LTGs  Skilled Therapeutic Interventions/Progress Updates:  Pt received in bed stating his legs were tired from earlier therapy. He did need to toilet.  Placed bed flat to simulate home bed set up.  Cued again to fully roll to R  side to push up and to fully bend his knees.  Pt able to sit up with CGA,.   Stood to [employer] with light CGA as pt stated "my L knee buckled earlier today". Ambulated to bathroom with RW with cues to keep walker close to pt.  S toilet transfer. Pt doffed his pants himself and sat on toilet.   Pt stated he needed to sit for awhile. Kept checking on pt every few minutes, After waiting 15 minutes pt stated he still needed more time to sit on toilet. At this point it was 11am, told pt his NT would come assist him once he was done. The NT aware of pt on toilet.    Therapy Documentation Precautions:  Precautions Precautions: Fall Precaution Comments: watch O2 and HR, jp drain Restrictions Weight Bearing Restrictions: No  General OT Amount of Missed Time: 45 Minutes   Pain: c/o R shoulder pain "it is worse today" Pain Assessment Pain Scale: 0-10 Pain Score: 6  Faces Pain Scale: No hurt Pain Type: Acute pain Pain Location: Generalized Pain Orientation: Right Pain Descriptors / Indicators: Aching;Cramping;Restless;Discomfort Pain Onset: On-going Patients Stated Pain Goal: 2 Pain Intervention(s): Medication (See eMAR);Repositioned Multiple Pain Sites: No ADL: ADL Eating: Independent Where Assessed-Eating: Bed level Grooming: Independent Where Assessed-Grooming: Sitting at sink Upper Body Bathing: Supervision/safety Where Assessed-Upper Body Bathing: Shower Lower Body Bathing: Minimal assistance Where Assessed-Lower Body Bathing: Shower Upper Body Dressing: Contact guard Where Assessed-Upper Body Dressing: Edge of bed Lower Body Dressing: Moderate assistance Where Assessed-Lower Body Dressing: Edge of bed Toileting: Minimal assistance Where Assessed-Toileting: Glass blower/designer: Therapist, music Method: Counselling psychologist: Raised toilet seat, Grab bars Tub/Shower Transfer: Unable to assess Tub/Shower Transfer Method: Unable to assess Press photographer: Curator Method: Unable to assess [employer]: Grab bars, Transfer tub bench   Therapy/Group: Individual Therapy  [place] 05/30/2022, 12:14 PM

## 2022-05-30 NOTE — Progress Notes (Signed)
Patient ID: Gregory [name], male   DOB: 03/20/1961, 61 y.o.   MRN: [id number]  SW spoke with [facility] CM- [name] Murguia  [phone]) clinicals were sent to utilization claim review, and understands appealing. States she is still waiting on updates and will follow-up once there is more information.   Loralee Pacas, MSW, [place] Office: [phone] Cell: [phone] Fax: [phone]

## 2022-05-30 NOTE — Consult Note (Signed)
Neuropsychological Consultation   Patient:   Gregory [name]   DOB:   1961/11/20  MR Number:  [id number]  Location:  [place] [id number] [place] [address] Dept: [place]: [phone]           Date of Service:   05/30/2022  Start Time:   9 AM End Time:   10 AM  Provider/Observer:  Ilean Skill, Psy.D.       Clinical Neuropsychologist       Billing Code/Service: [id number]  Chief Complaint:    Gregory [name] is a 61 year old male with recent diagnosis of colon mass/colon cancer stage IV.  Patient with previous significant history of ventricular tachycardia status post AICD in 2006.  Patient presented to ED on 5/25 via EMS after experiencing palpitations and shock discharge from his ICD.  Patient with large intra-abdominal abscess with surgical consultation and placed on broad-spectrum antibiotics.  Infectious disease consult.  Patient assessed and referred to inpatient rehab due to functional deficits secondary to sepsis and recent colon surgery.  Reason for Service:  Patient was referred for neuropsychological consultation due to anxiety and coping issues.  Below is the HPI for the current admission.  HPI: Gregory [name] is a 61 year old male with recent diagnosis of colon mass status post right colon resection on Apr 18, 2022.  His history is also significant for ventricular tachycardia status post AICD in 2006.  He was brought to the emergency department on 5/25 via EMS after experiencing palpitations and shock discharge from his ICD.  Electrophysiology consultation was obtained and he was placed on amiodarone infusion.  CT scan of the abdomen pelvis performed and showed a very large intra-abdominal abscess as well as pseudoaneurysm of the splenic artery.  General surgery was consulted who discussed findings with interventional radiology.  Broad-spectrum antibiotics  started.  CT angiogram was performed the following morning.  He underwent percutaneous drainage of the abscess and embolization of the splenic artery pseudoaneurysm on 5/26 by Dr. Dwaine [name].  Infectious disease consultation was obtained.  Cultures grew E. coli and strep anginosus sensitive to Unasyn.   He continued to have intermittent runs of ventricular tachycardia and electrophysiology continued monitoring.  He has a history of coronary artery disease status post PCI/stent with no anginal symptoms.  Follow-up abdominal CT revealed persistent intra-abdominal abscess and on 5/31, he underwent catheter exchange and repositioning by IR.     During his previous hospitalization, he was found to have left upper extremity DVT in the left basilic vein on 5/8.  Due to upper extremity edema a repeat duplex study was performed on 6/1 with findings of left brachial vein. thrombosis.  Heparin infusion initiated. Transitioned to Eliquis.   He was transitioned to oral amiodarone on 6/2, however he developed recurrent VT and was placed back on amiodarone intravenously.  Mexiletine was initiated.  Able to transition back to amiodarone 400 mg p.o. twice daily on 6/6.  Infectious disease follow-up on 6/7 recommend switching to amoxicillin/clavulanate 875 mg twice daily for 3-4 additional weeks and recommends repeat CT of the abdomen around 6/12.  Percutaneous drain continues.The patient requires inpatient medicine and rehabilitation evaluations and services for ongoing dysfunction secondary to sepsis, intra-abdominal abscess and recent colon surgery.  Current Status:  Patient was awake and alert laying in bed as I entered the room with slight head elevation.  Patient did not raises head up off the bed during our interactions.  Patient was awake and alert with good mental status and aware of his medical conditions although he focused primarily on his cardiovascular issues rather than recent diagnosis of metastatic cancer.  Patient  is aware of plans for oncology post discharge.  Patient reports that some of his biggest stressors had to do with how long he was going to be able to stay on the inpatient rehab program versus having to discharge to skilled nursing facility rather than discharge home.  Behavioral Observation: Gregory [name]  presents as a 61 y.o.-year-old Right handed African American Male who appeared his stated age. his dress was Appropriate and he was Well Groomed and his manners were Appropriate to the situation.  his participation was indicative of Appropriate and Attentive behaviors.  There were physical disabilities noted.  he displayed an appropriate level of cooperation and motivation.     Interactions:    Active Appropriate  Attention:   within normal limits and attention span and concentration were age appropriate  Memory:   within normal limits; recent and remote memory intact  Visuo-spatial:  not examined  Speech (Volume):  low  Speech:   normal; normal  Thought Process:  Coherent and Relevant  Though Content:  WNL; not suicidal and not homicidal  Orientation:      Judgment:   Good  Planning:   Fair  Affect:    Appropriate  Mood:    Dysphoric  Insight:   Good  Intelligence:   normal  Medical History:   Past Medical History:  Diagnosis Date   AICD (automatic cardioverter/defibrillator) present 2005   CAD (coronary artery disease) 12/01/2013   Chronic combined systolic and diastolic CHF, NYHA class 1 ([place]) 12/01/2013   Erectile dysfunction 12/01/2013   HTN (hypertension) 12/01/2013   Hyperlipidemia 12/01/2013   Ischemic cardiomyopathy 12/01/2013   Presence of permanent cardiac pacemaker    Sleep apnea          Patient Active Problem List   Diagnosis Date Noted   Postprocedural intraabdominal abscess 05/22/2022   Controlled type 2 diabetes mellitus without complication, without long-term current use of insulin ([place]) 05/08/2022   Recurrent ventricular  tachycardia s/p AICD with shock  05/08/2022   Ileus following gastrointestinal surgery ([place]) 05/08/2022   Hypothyroidism 05/08/2022   Leukocytosis 05/08/2022   Postoperative ileus ([place]) 04/22/2022   Cancer of right colon ([place]) 04/17/2022   Benign neoplasm of colon    Anticoagulated    Symptomatic anemia/iron deficiency anemia 04/12/2022   ICD (implantable cardioverter-defibrillator) in place 03/21/2022   Slow transit constipation    Lipoma    Sleep disturbance    Hypoalbuminemia due to protein-calorie malnutrition (HCC)    Seizures (HCC)    Paroxysmal atrial fibrillation ([place])    Debility 09/29/2020   Shock ([place]) 09/13/2020   Thrombocytopenia (HCC)    Localization-related idiopathic epilepsy and epileptic syndromes with seizures of localized onset, not intractable, without status epilepticus ([place]) 10/20/2018   Chronic combined systolic and diastolic CHF (congestive heart failure) (HCC)/ischemic cardiomyopathy 12/01/2013   CAD S/P percutaneous coronary angioplasty 12/01/2013   Erectile dysfunction 12/01/2013   Essential hypertension 12/01/2013   Hypercholesterolemia 12/01/2013   OSA on CPAP 12/01/2013     Psychiatric History:  No prior psychiatric history  Family Med/Psych History:  Family History  Problem Relation Age of Onset   Heart disease Mother    Brain cancer Father    Hypertension Daughter     Impression/DX:  Gregory [name] is a 61 year old male with recent diagnosis  of colon mass/colon cancer stage IV.  Patient with previous significant history of ventricular tachycardia status post AICD in 2006.  Patient presented to ED on 5/25 via EMS after experiencing palpitations and shock discharge from his ICD.  Patient with large intra-abdominal abscess with surgical consultation and placed on broad-spectrum antibiotics.  Infectious disease consult.  Patient assessed and referred to inpatient rehab due to functional deficits secondary to sepsis and recent colon  surgery.  Patient was awake and alert laying in bed as I entered the room with slight head elevation.  Patient did not raises head up off the bed during our interactions.  Patient was awake and alert with good mental status and aware of his medical conditions although he focused primarily on his cardiovascular issues rather than recent diagnosis of metastatic cancer.  Patient is aware of plans for oncology post discharge.  Patient reports that some of his biggest stressors had to do with how long he was going to be able to stay on the inpatient rehab program versus having to discharge to skilled nursing facility rather than discharge home.  Disposition/Plan:  Worked on coping and adjustment issues particular and anxiety with patient reporting improvements in anxiety from few days prior.  Addressed issues related to his colon cancer diagnosis and his cardiovascular issues  Diagnosis:    Postprocedural intraabdominal abscess - Plan: IR Radiologist Eval & Mgmt         Electronically Signed   _______________________ Ilean Skill, Psy.D. Clinical Neuropsychologist

## 2022-05-30 NOTE — Progress Notes (Signed)
Physical Therapy Session Note  Patient Details  Name: Gregory [name] MRN: [id number] Date of Birth: 10-Oct-1961  Today's Date: 05/30/2022 PT Individual Time: 0347-4259 PT Individual Time Calculation (min): 72 min   Short Term Goals: Week 1:  PT Short Term Goal 1 (Week 1): Pt will perform bed mobility with min PT Short Term Goal 2 (Week 1): Pt will transfer to [facility] with CGA PT Short Term Goal 3 (Week 1): Pt will ascend 4steps with min assist PT Short Term Goal 4 (Week 1): Pt will ambulate 52f with min assist PT Short Term Goal 5 (Week 1): Pt will propell WC 1058fwith supervision assist  Skilled Therapeutic Interventions/Progress Updates: Pt presented in bed agreeable to therapy. Pt states pain 3-4/10 in low back with rest breaks provided as needed. Pt performed bed mobility with supervision and use of bed features. Performed ambulatory transfer to w/c with CGA. Pt transported to rehab gym for energy conservation. Pt ambulated [id number] stanrs then received seated rest. Participated in ascending/descending 2 x 4 steps (6in) with seated rest break between bouts. Pt then moved to parallel bars and performed step ups on 6in step x 5 forwards bilaterally then x 5 sidestepping bilaterally. Pt also performed standing hip flexion (marches), hip abd/add, and standing hamstring curls  x 10 bilaterally. Pt required increased rest breaks between bouts due to fatigue. During rest breaks discussed current progression towards goals (as pt mostly CGA overall). Pt then propelled ~34f21f 4W nsg station with supervision. After seated rest break pt ambulated 95ft37froom with CGA and RW. Pt instructed in how to use gait belt as leg lifter and was able to bring LLE onto bed with gait belt and independently place RLE on bed to achieve supine to flat bed. Pt repositioned self to comfort and left with bed alarm on, call bell within reach and x 4 rails up per pt request.      Therapy Documentation Precautions:   Precautions Precautions: Fall Precaution Comments: watch O2 and HR, jp drain Restrictions Weight Bearing Restrictions: No General:   Vital Signs: Therapy Vitals Temp: 97.8 F (36.6 C) Pulse Rate: 76 Resp: 17 BP: (!) 135/96 Patient Position (if appropriate): Lying Oxygen Therapy SpO2: 96 % O2 Device: Room Air Pain:   Mobility:   Locomotion :    Trunk/Postural Assessment :    Balance:   Exercises:   Other Treatments:      Therapy/Group: Individual Therapy  Eilis Chestnutt 05/30/2022, 7:57 AM

## 2022-05-30 NOTE — Progress Notes (Signed)
Physical Therapy Weekly Progress Note  Patient Details  Name: Gregory [name] MRN: [id number] Date of Birth: March 02, 1961  Beginning of progress report period: Apr 22, 2022 End of progress report period: Apr 29, 2022  Today's Date: 05/30/2022 PT Individual Time:  -      Patient has met 4 of 4 short term goals.  Pt has made slow but steady gains during this current course of therapy. Pt is currently minA to CGA overall for bed mobility, transfers, ambulation, and stair training has been initiated. Pt is most limited by decreased endurance and decreased BLE strength which limits ambulation distance, standing tolerance, and stairs. Focus continues to be on BLE strengthening, endurance, and progression of stair training.   Patient continues to demonstrate the following deficits muscle weakness and muscle joint tightness and therefore will continue to benefit from skilled PT intervention to increase functional independence with mobility.  Patient progressing toward long term goals..  Continue plan of care.  PT Short Term Goals Week 1:  PT Short Term Goal 1 (Week 1): Pt will perform bed mobility with min PT Short Term Goal 1 - Progress (Week 1): Met PT Short Term Goal 2 (Week 1): Pt will transfer to [facility] with CGA PT Short Term Goal 2 - Progress (Week 1): Met PT Short Term Goal 3 (Week 1): Pt will ascend 4steps with min assist PT Short Term Goal 3 - Progress (Week 1): Met PT Short Term Goal 4 (Week 1): Pt will ambulate 73f with min assist PT Short Term Goal 4 - Progress (Week 1): Met PT Short Term Goal 5 (Week 1): Pt will propell WC 1033fwith supervision assist PT Short Term Goal 5 - Progress (Week 1): Met Week 2:  PT Short Term Goal 1 (Week 2): =LTGs d/t ELOS  Skilled Therapeutic Interventions/Progress Updates:  Ambulation/gait training;Discharge planning;Functional mobility training;Psychosocial support;Therapeutic Activities;Balance/vestibular training;Disease  management/prevention;Neuromuscular re-education;Skin care/wound management;Therapeutic Exercise;Wheelchair propulsion/positioning;Visual/perceptual remediation/compensation;Cognitive remediation/compensation;DME/adaptive equipment instruction;Pain management;UE/LE Strength taining/ROM;UE/LE Coordination activities;Stair training;Patient/family education;Community reintegration   Therapy Documentation Precautions:  Precautions Precautions: Fall Precaution Comments: watch O2 and HR, jp drain Restrictions Weight Bearing Restrictions: No    Therapy/Group: Individual Therapy  OlMickel Fuchs/16/2023, 11:29 AM

## 2022-05-30 NOTE — Progress Notes (Signed)
Physical Therapy Session Note  Patient Details  Name: Gregory [name] MRN: [id number] Date of Birth: [birth year]  Today's Date: 05/30/2022 PT Individual Time: 0800-0900 PT Individual Time Calculation (min): 60 min   Short Term Goals: Week 1:  PT Short Term Goal 1 (Week 1): Pt will perform bed mobility with min PT Short Term Goal 1 - Progress (Week 1): Met PT Short Term Goal 2 (Week 1): Pt will transfer to [facility] with CGA PT Short Term Goal 2 - Progress (Week 1): Met PT Short Term Goal 3 (Week 1): Pt will ascend 4steps with min assist PT Short Term Goal 3 - Progress (Week 1): Met PT Short Term Goal 4 (Week 1): Pt will ambulate 43f with min assist PT Short Term Goal 4 - Progress (Week 1): Met PT Short Term Goal 5 (Week 1): Pt will propell WC 1027fwith supervision assist PT Short Term Goal 5 - Progress (Week 1): Met  Skilled Therapeutic Interventions/Progress Updates:  pt received in bed and agreeable to therapy. MD in/out at start of session for morning rounds. Pt reports 4/10 pain in his low back, premedicated. Rest and positioning provided as needed. Supine>sit with bed features and supervision, cueing for technique for improved trunk flexion/rotation. Sit to stand throughout session with CGA to RW. ambulatory transfer to w/c with RW and CGA. Pt transported to therapy gym for time management and energy conservation. Pt ambulated 2 x 90 ft with RW and CGA. Pt reported some lightheadedness that improved in sitting, but was able to continue session. Extended rest breaks required d/t fatigue. Pt performed step ups on 6" step with BIL rails, 3 x 8 with RLE leading for strength and endurance in preparation for home steps to access second floor. Pt returned to room and returned to bed with supervision, was left with all needs in reach and alarm active.   Therapy Documentation Precautions:  Precautions Precautions: Fall Precaution Comments: watch O2 and HR, jp drain Restrictions Weight  Bearing Restrictions: No General:      Therapy/Group: Individual Therapy  OlMickel Fuchs/16/2023, 11:35 AM

## 2022-05-31 DIAGNOSIS — I2510 Atherosclerotic heart disease of native coronary artery without angina pectoris: Secondary | ICD-10-CM

## 2022-05-31 DIAGNOSIS — B356 Tinea cruris: Secondary | ICD-10-CM

## 2022-05-31 NOTE — Progress Notes (Signed)
Physical Therapy Session Note  Patient Details  Name: Gregory [name] MRN: [id number] Date of Birth: 01/26/1961  Today's Date: 05/31/2022 PT Individual Time: 1035-1113 PT Individual Time Calculation (min): 38 min   Short Term Goals: Week 1:  PT Short Term Goal 1 (Week 1): Pt will perform bed mobility with min PT Short Term Goal 1 - Progress (Week 1): Met PT Short Term Goal 2 (Week 1): Pt will transfer to WC with CGA PT Short Term Goal 2 - Progress (Week 1): Met PT Short Term Goal 3 (Week 1): Pt will ascend 4steps with min assist PT Short Term Goal 3 - Progress (Week 1): Met PT Short Term Goal 4 (Week 1): Pt will ambulate 50ft with min assist PT Short Term Goal 4 - Progress (Week 1): Met PT Short Term Goal 5 (Week 1): Pt will propell WC 100ft with supervision assist PT Short Term Goal 5 - Progress (Week 1): Met Week 2:  PT Short Term Goal 1 (Week 2): =LTGs d/t ELOS   Skilled Therapeutic Interventions/Progress Updates:   Pt received supine in bed and agreeable to PT. PT assisted pt to don BiL compression stockings.  Supine>sit transfer with CGA at trunk and cues for UE placement on bed rails. Stand pivot transfer to WC with RW and supervision assist. Pt transported to entrance of W[facility]. Gait training on cement sidewalk 2 x 180ft with supervision asssist from PT for safety. Pt reports mild L knee instability on second walk, but no buckling noted. Pt reports need for BM. Transported to room inWC. Ambulatory transfer to BSC over toilet with RW and supervision assist. Pt able to perform clothing management. Pt left sitting on toilet with call bell in reach. RN aware of pt position.       Therapy Documentation Precautions:  Precautions Precautions: Fall Precaution Comments: watch O2 and HR, jp drain Restrictions Weight Bearing Restrictions: No  Therapy Vitals Temp: (!) 97.4 F (36.3 C) Pulse Rate: 78 Resp: 18 BP: 130/89 Patient Position (if appropriate): Lying Oxygen  Therapy SpO2: 94 % O2 Device: Room Air Pain:       Therapy/Group: Individual Therapy   E  05/31/2022, 4:21 PM  

## 2022-05-31 NOTE — Progress Notes (Signed)
PROGRESS NOTE   Subjective/Complaints:  No new concerns or complaints this AM.   ROS: Negative for fever, chills, SOB, CP, dizziness, N/V/C/D.  No cough    Objective:   No results found. Recent Labs    05/30/22 0515  WBC 4.8  HGB 10.7*  HCT 35.9*  PLT 308    Recent Labs    05/30/22 0515  NA 135  K 4.1  CL 99  CO2 28  GLUCOSE 100*  BUN 10  CREATININE 0.72  CALCIUM 7.8*     Intake/Output Summary (Last 24 hours) at 05/31/2022 1251 Last data filed at 05/31/2022 0900 Gross per 24 hour  Intake 185 ml  Output 1465 ml  Net -1280 ml         Physical Exam: Vital Signs Blood pressure (!) 145/105, pulse 74, temperature 97.8 F (36.6 C), resp. rate 18, height '6\' 6"'$  (1.981 m), weight 93.3 kg, SpO2 94 %.  Physical Exam    General: Alert and oriented x 3, No apparent distress HEENT: Head is normocephalic, atraumatic, PERRLA, EOMI bilateral, sclera anicteric, oral mucosa pink and moist, dentition intact, ext ear canals clear,  Note: Both eyes have chronic fatty deposits on lateral aspects of eye not interfering with vision.  Left > Right. Neck: Supple without JVD or lymphadenopathy Heart: Reg rate and rhythm. No murmurs rubs or gallops Chest: CTA bilaterally without wheezes, rales, or rhonchi; no distress Abdomen: Soft, non-tender, non-distended, bowel sounds positive. Has bandage on R side of abd with JP drain, with small amount purulent drainage. Extremities: No clubbing, cyanosis. Pulses are 2+. 2+ LE edema Psych: Pt's affect is appropriate. Pt is cooperative Skin: Warm and dry. Small area superficial skin breakdown left sacral area.  Skin tear R buttock, nickel sized. Rt forearm reddend from previous IV.  Fungal rash  b/l groin Neuro: Alert and oriented x3, CN II-XII intact, no focal deficits. Sensory: Intact to LT all 4 extremities Musculoskeletal: Neck supple, cervical back supple. No tenderness. Lt arm  forearm to mid-upper arm non-pitting 2+ bilateral LE's 2+ pitting edema to knees bilateral, appears a little improved from yesterday Motor: 5-/5 throughout except LLE 4+/5 and 4/5 R shoulder likely related to pain, tenderness around R medical scapula and posterior shoulder. 4/5 R biceps flexion also likely limited to pain   Assessment/Plan: 1. Functional deficits which require 3+ hours per day of interdisciplinary therapy in a comprehensive inpatient rehab setting. Physiatrist is providing close team supervision and 24 hour management of active medical problems listed below. Physiatrist and rehab team continue to assess barriers to discharge/monitor patient progress toward functional and medical goals  Care Tool:  Bathing    Body parts bathed by patient: Right arm, Left arm, Chest, Abdomen, Right upper leg, Left upper leg, Face, Front perineal area, Left lower leg, Right lower leg   Body parts bathed by helper: Buttocks     Bathing assist Assist Level: Minimal Assistance - Patient > 75%     Upper Body Dressing/Undressing Upper body dressing   What is the patient wearing?: Pull over shirt    Upper body assist Assist Level: Contact Guard/Touching assist    Lower Body Dressing/Undressing Lower body dressing  What is the patient wearing?: Pants     Lower body assist Assist for lower body dressing: Supervision/Verbal cueing     Toileting Toileting    Toileting assist Assist for toileting: Minimal Assistance - Patient > 75%     Transfers Chair/bed transfer  Transfers assist     Chair/bed transfer assist level: Contact Guard/Touching assist     Locomotion Ambulation   Ambulation assist      Assist level: Contact Guard/Touching assist Assistive device: Walker-rolling Max distance: 65   Walk 10 feet activity   Assist     Assist level: Contact Guard/Touching assist Assistive device: Walker-rolling   Walk 50 feet activity   Assist Walk 50 feet with  2 turns activity did not occur: Safety/medical concerns  Assist level: Contact Guard/Touching assist Assistive device: Walker-rolling    Walk 150 feet activity   Assist Walk 150 feet activity did not occur: Safety/medical concerns         Walk 10 feet on uneven surface  activity   Assist Walk 10 feet on uneven surfaces activity did not occur: Safety/medical concerns         Wheelchair     Assist Is the patient using a wheelchair?: Yes Type of Wheelchair: Manual    Wheelchair assist level: Minimal Assistance - Patient > 75% Max wheelchair distance: 100    Wheelchair 50 feet with 2 turns activity    Assist        Assist Level: Minimal Assistance - Patient > 75%   Wheelchair 150 feet activity     Assist      Assist Level: Moderate Assistance - Patient 50 - 74%   Blood pressure (!) 145/105, pulse 74, temperature 97.8 F (36.6 C), resp. rate 18, height '6\' 6"'$  (1.981 m), weight 93.3 kg, SpO2 94 %.  Medical Problem List and Plan: 1. Functional deficits secondary to sepsis, intra-abdominal abscess status post percutaneous drain and recent colon surgery             -patient may  shower if covers JP drain site             -ELOS/Goals: 10-12 days mod I to intermittent supervision             -Continue CIR, continue  PT and OT  -Continue to work on endurance with therapy  -est discharge 6/23, mod I Sup  -Ambulated 90 ft with RW 2.  Antithrombotics: -DVT/anticoagulation:  Pharmaceutical: Other (comment) Eliquis 5 mg twice daily             -antiplatelet therapy: None 3. Pain Management: Tylenol, Percocet as needed 4. Mood: LCSW to evaluate and provide emotional support             -antipsychotic agents: n/a 5. Neuropsych: This patient is capable of making decisions on his own behalf.  -Seen by Dr. Sima Matas 6. Skin/Wound Care: Routine skin care checks             --routine drain care 7. Fluids/Electrolytes/Nutrition: Routine ins and outs and  follow-up chemistries 8: Intraabdominal abscess s/p perc drain placement- pt knows might go home with drain             --plan Augmentin for 3-4 weeks per ID             -Drain was placed 5/27, exchanged 5/31 by IR             --plan follow-up CT 6/12             --  continue monitoring drain output; follow-up IR drain clinic  -Drain output appears to be gradually decreasing 9: Colon cancer s/p right hemicolectomy 5/5 with mets to lymph nodes             --follow-up with Dr. Burr Medico; PET scan pending- outpatient? 10: CAD/prior PCI to LAD             --cath on 5/4-stable continue medical management             --continue Plavix and aspirin  -Denies chest pain 11: Recurrent VT/prior ACID: continue mexiletine and amiodorone             --keep K+ > 4.0; Mg > 2.0             --follow-up with Dr. Caryl Comes  -6/15 K+ 4.1 today 12: Chronic sys and dia HF: continue [place]; Lasix              --daily weight             --follow-up Dr. Sallyanne Kuster             -Continue lasix '40mg'$  BID 6/11 Noted pitting edema +2 on bilateral lower extremities mostly lower legs above ankle.  Per nursing legs were taut overnight. Increased Lasix from 40 mg to 80 mg BID and added 40 mEq K-Dur, trend K+ with qMonday labs. Currently at 3.6, prior to increasing Lasix dose. -Placed in-pt consult to cardiology for Monday follow up on fluid volume status, changes with Lasix, K-dur -6/14 K+ 3.6 today, stable, continue to follow, edema slightly improved -6/16 K+ stable at 4.1 continue current treatment     Latest Ref Rng & Units 05/30/2022    5:15 AM 05/28/2022    5:12 AM 05/26/2022    5:58 AM  BMP  Glucose 70 - 99 mg/dL 100  99  100   BUN 8 - 23 mg/dL '10  8  9   '$ Creatinine 0.61 - 1.24 mg/dL 0.72  0.75  0.84   Sodium 135 - 145 mmol/L 135  136  136   Potassium 3.5 - 5.1 mmol/L 4.1  3.6  3.7   Chloride 98 - 111 mmol/L 99  103  104   CO2 22 - 32 mmol/L '28  28  26   '$ Calcium 8.9 - 10.3 mg/dL 7.8  7.7  7.2    13: Paroxysmal AF:  continue Eliquis 14: Left brachial DVT: continue Eliquis 15: Hypothyroidism; continue Synthroid 16: Hypertension: takes metoprolol and entresto at home.              -He was restarted on entresto, monitor BP, was discussed with cardiology prior to starting 6/12 BP continues to be overall well controlled, continue to monitor, consider restart BB -6/17 BP well controlled overall    05/31/2022    4:42 AM 05/31/2022    4:41 AM 05/30/2022    8:27 PM  Vitals with BMI  Weight  205 lbs 11 oz   BMI  29.79   Systolic 892  119  Diastolic 417  84  Pulse 74  75    17: Seizure disorder: no recent seizure activity; continue Keppra 18: OSA: continue CPAP 19: DM: has been on regular diet without need for insulin coverage; A1c = 5.1             --d/c SSI 20: Hyperlipidemia: continue Zetia, Lipitor 21. Cellulitis? R forearm- already on Unasyn- should cover- will monitor 6/10 continue to monitor R forearm, already on Unasyn 6/11 Reddened  area on R forearm, but skin intact. 22. Anemia             -5/14 anemia stable with HGB 10.6  -5/16 HGB stable at 10.7 23. Hypoalbuminemia             -Appears to be eating more of his meals, discussed protein intake, continue to follow 24. Anxiety 6/11 Per nursing and pt, he is feeling very anxious today with concerns about medical prognosis. Ordered low dose 0.25 mg Xanax prn for mild anxiety, prn and Neuropsych. evaluation for Monday. 25. R Scapula pain with Rhomboid tenderness  -Therapy completed manual trigger point release  -6/15 xray shoulder ordered- severe OA, Voltaren gel ordered  26. Tinea Cruris , improving  -Nystatin powder LOS: 9 days A FACE TO FACE EVALUATION WAS PERFORMED  Jennye Boroughs 05/31/2022, 12:51 PM

## 2022-05-31 NOTE — Progress Notes (Signed)
Pt refusing to use CPAP for the night.

## 2022-06-01 MED ORDER — FUROSEMIDE 40 MG PO TABS
40.0000 mg | ORAL_TABLET | Freq: Two times a day (BID) | ORAL | Status: DC
  Administered 2022-06-01 – 2022-06-05 (×8): 40 mg via ORAL
  Filled 2022-06-01 (×8): qty 1

## 2022-06-01 NOTE — Progress Notes (Addendum)
Occupational Therapy Session Note  Patient Details  Name: Gregory [name] MRN: [id number] Date of Birth: [birth year]  Today's Date: 06/01/2022 OT Individual Time: 1400-1435 OT Individual Time Calculation (min): 35 min    Short Term Goals: Week 2:  OT Short Term Goal 1 (Week 2): STGs = LTGs  Skilled Therapeutic Interventions/Progress Updates:  Pt greeted supine in bed agreeable to OT intervention. Session focus on BADL reeducation, functional mobility, dynamic standing balance and decreasing overall caregiver burden.     Pt requested to shower ( drain site covered with shower shield and reinforced with tape), pt needed MIN A to elevate trunk into sitting. CGA for ambulatory toilet transfer with RW, + urine void with CGA for 3/3 toileting tasks. Pt completed functional mobility to shower with Rw and CGA, pt completed bathing from shower seat needing MINA overall for bathing with pt only needing assist to wash buttock in standing, pt using LH sponge for all other bathing tasks d/t bilateral shoulder weakness. Pt exited shower with MIN A for increased safety. Pt dressed from recliner with set- up for [facility] shirt and CGA for LB dressing via sit>stand.    pt left seated in recliner with all needs within reach.                      Therapy Documentation Precautions:  Precautions Precautions: Fall Precaution Comments: watch O2 and HR, jp drain Restrictions Weight Bearing Restrictions: No  Pain: no pain reported during session     Therapy/Group: Individual Therapy  Precious Haws 06/01/2022, 3:44 PM

## 2022-06-01 NOTE — Progress Notes (Signed)
PROGRESS NOTE   Subjective/Complaints: Decreased output in his drain. No additional concerns.   ROS: Negative for fever, chills, SOB, CP, dizziness, N/V/C/D.  No vision changers    Objective:   No results found. Recent Labs    05/30/22 0515  WBC 4.8  HGB 10.7*  HCT 35.9*  PLT 308    Recent Labs    05/30/22 0515  NA 135  K 4.1  CL 99  CO2 28  GLUCOSE 100*  BUN 10  CREATININE 0.72  CALCIUM 7.8*     Intake/Output Summary (Last 24 hours) at 06/01/2022 1404 Last data filed at 06/01/2022 1200 Gross per 24 hour  Intake 240 ml  Output 2500 ml  Net -2260 ml         Physical Exam: Vital Signs Blood pressure 114/85, pulse 80, temperature 98.2 F (36.8 C), temperature source Oral, resp. rate 16, height '6\' 6"'$  (1.981 m), weight 93.3 kg, SpO2 97 %.  Physical Exam    General: Alert and oriented x 3, No apparent distress HEENT: Head is normocephalic, atraumatic, PERRLA, EOMI bilateral, sclera anicteric, oral mucosa pink and moist, dentition intact, ext ear canals clear,  Note: Both eyes have chronic fatty deposits on lateral aspects of eye not interfering with vision.  Left > Right. Neck: Supple without JVD or lymphadenopathy Heart: Reg rate and rhythm. No murmurs rubs or gallops Chest: CTA bilaterally without wheezes, rales, or rhonchi; no distress Abdomen: Soft, non-tender, non-distended, bowel sounds positive. Has bandage on R side of abd with JP drain, with  minimal around of purulent drainage. Extremities: No clubbing, cyanosis. Pulses are 2+. 1+ LE edema Psych: Pt's affect is appropriate. Pt is cooperative Skin: Warm and dry. Small area superficial skin breakdown left sacral area.  Skin tear R buttock, nickel sized. Rt forearm reddend from previous IV.  Fungal rash  b/l groin Neuro: Alert and oriented x3, CN II-XII intact, no focal deficits. Sensory: Intact to LT all 4 extremities Musculoskeletal: Neck  supple, cervical back supple. No tenderness. Motor: 5-/5 throughout except LLE 4+/5 and 4/5 R shoulder likely related to pain, tenderness around R medical scapula and posterior shoulder. 4/5 R biceps flexion also likely limited to pain   Assessment/Plan: 1. Functional deficits which require 3+ hours per day of interdisciplinary therapy in a comprehensive inpatient rehab setting. Physiatrist is providing close team supervision and 24 hour management of active medical problems listed below. Physiatrist and rehab team continue to assess barriers to discharge/monitor patient progress toward functional and medical goals  Care Tool:  Bathing    Body parts bathed by patient: Right arm, Left arm, Chest, Abdomen, Right upper leg, Left upper leg, Face, Front perineal area, Left lower leg, Right lower leg   Body parts bathed by helper: Buttocks     Bathing assist Assist Level: Minimal Assistance - Patient > 75%     Upper Body Dressing/Undressing Upper body dressing   What is the patient wearing?: Pull over shirt    Upper body assist Assist Level: Contact Guard/Touching assist    Lower Body Dressing/Undressing Lower body dressing      What is the patient wearing?: Pants     Lower body  assist Assist for lower body dressing: Supervision/Verbal cueing     Toileting Toileting    Toileting assist Assist for toileting: Minimal Assistance - Patient > 75%     Transfers Chair/bed transfer  Transfers assist     Chair/bed transfer assist level: Contact Guard/Touching assist     Locomotion Ambulation   Ambulation assist      Assist level: Contact Guard/Touching assist Assistive device: Walker-rolling Max distance: 65   Walk 10 feet activity   Assist     Assist level: Contact Guard/Touching assist Assistive device: Walker-rolling   Walk 50 feet activity   Assist Walk 50 feet with 2 turns activity did not occur: Safety/medical concerns  Assist level: Contact  Guard/Touching assist Assistive device: Walker-rolling    Walk 150 feet activity   Assist Walk 150 feet activity did not occur: Safety/medical concerns         Walk 10 feet on uneven surface  activity   Assist Walk 10 feet on uneven surfaces activity did not occur: Safety/medical concerns         Wheelchair     Assist Is the patient using a wheelchair?: Yes Type of Wheelchair: Manual    Wheelchair assist level: Minimal Assistance - Patient > 75% Max wheelchair distance: 100    Wheelchair 50 feet with 2 turns activity    Assist        Assist Level: Minimal Assistance - Patient > 75%   Wheelchair 150 feet activity     Assist      Assist Level: Moderate Assistance - Patient 50 - 74%   Blood pressure 114/85, pulse 80, temperature 98.2 F (36.8 C), temperature source Oral, resp. rate 16, height '6\' 6"'$  (1.981 m), weight 93.3 kg, SpO2 97 %.  Medical Problem List and Plan: 1. Functional deficits secondary to sepsis, intra-abdominal abscess status post percutaneous drain and recent colon surgery             -patient may  shower if covers JP drain site             -ELOS/Goals: 10-12 days mod I to intermittent supervision             -Continue CIR, continue  PT and OT  -Continue to work on endurance with therapy  -est discharge 6/23, mod I Sup  -Ambulated 90 ft with RW 2.  Antithrombotics: -DVT/anticoagulation:  Pharmaceutical: Other (comment) Eliquis 5 mg twice daily             -antiplatelet therapy: None 3. Pain Management: Tylenol, Percocet as needed 4. Mood: LCSW to evaluate and provide emotional support             -antipsychotic agents: n/a 5. Neuropsych: This patient is capable of making decisions on his own behalf.  -Seen by Dr. Sima Matas 6. Skin/Wound Care: Routine skin care checks             --routine drain care 7. Fluids/Electrolytes/Nutrition: Routine ins and outs and follow-up chemistries 8: Intraabdominal abscess s/p perc drain  placement- pt knows might go home with drain             --plan Augmentin for 3-4 weeks per ID             -Drain was placed 5/27, exchanged 5/31 by IR             --plan follow-up CT 6/12             --continue monitoring drain output; follow-up IR  drain clinic  -Drain output continues to decrease 9: Colon cancer s/p right hemicolectomy 5/5 with mets to lymph nodes             --follow-up with Dr. Burr Medico; PET scan pending- outpatient? 10: CAD/prior PCI to LAD             --cath on 5/4-stable continue medical management             --continue Plavix and aspirin  -Denies chest pain 11: Recurrent VT/prior ACID: continue mexiletine and amiodorone             --keep K+ > 4.0; Mg > 2.0             --follow-up with Dr. Caryl Comes  -6/15 K+ 4.1 today  -BMP tomorrow 12: Chronic sys and dia HF: continue [place]; Lasix              --daily weight             --follow-up Dr. Sallyanne Kuster             -Continue lasix '40mg'$  BID 6/11 Noted pitting edema +2 on bilateral lower extremities mostly lower legs above ankle.  Per nursing legs were taut overnight. Increased Lasix from 40 mg to 80 mg BID and added 40 mEq K-Dur, trend K+ with qMonday labs. Currently at 3.6, prior to increasing Lasix dose. -Placed in-pt consult to cardiology for Monday follow up on fluid volume status, changes with Lasix, K-dur -6/14 K+ 3.6 today, stable, continue to follow, edema slightly improved -6/16 K+ stable at 4.1 continue current treatment -6/18 Edema improved, decrease lasix to '40mg'$  BID, labs tomorrow     Latest Ref Rng & Units 05/30/2022    5:15 AM 05/28/2022    5:12 AM 05/26/2022    5:58 AM  BMP  Glucose 70 - 99 mg/dL 100  99  100   BUN 8 - 23 mg/dL '10  8  9   '$ Creatinine 0.61 - 1.24 mg/dL 0.72  0.75  0.84   Sodium 135 - 145 mmol/L 135  136  136   Potassium 3.5 - 5.1 mmol/L 4.1  3.6  3.7   Chloride 98 - 111 mmol/L 99  103  104   CO2 22 - 32 mmol/L '28  28  26   '$ Calcium 8.9 - 10.3 mg/dL 7.8  7.7  7.2    13: Paroxysmal AF:  continue Eliquis 14: Left brachial DVT: continue Eliquis 15: Hypothyroidism; continue Synthroid 16: Hypertension: takes metoprolol and entresto at home.              -He was restarted on entresto, monitor BP, was discussed with cardiology prior to starting 6/12 BP continues to be overall well controlled, continue to monitor, consider restart BB -6/18 well controlled    06/01/2022    1:40 PM 06/01/2022    5:00 AM 05/31/2022    7:56 PM  Vitals with BMI  Systolic 308 657 846  Diastolic 85 93 88  Pulse 80 77 82    17: Seizure disorder: no recent seizure activity; continue Keppra 18: OSA: continue CPAP 19: DM: has been on regular diet without need for insulin coverage; A1c = 5.1             --d/c SSI 20: Hyperlipidemia: continue Zetia, Lipitor 21. Cellulitis? R forearm- already on Unasyn- should cover- will monitor 6/10 continue to monitor R forearm, already on Unasyn 6/11 Reddened area on R forearm, but skin intact. -improved 22. Anemia             -  5/14 anemia stable with HGB 10.6  -5/16 HGB stable at 10.7  -Repeat cbc tomorrow 23. Hypoalbuminemia             -Appears to be eating more of his meals, discussed protein intake, continue to follow 24. Anxiety 6/11 Per nursing and pt, he is feeling very anxious today with concerns about medical prognosis. Ordered low dose 0.25 mg Xanax prn for mild anxiety, prn and Neuropsych. evaluation for Monday. 25. R Scapula pain with Rhomboid tenderness  -Therapy completed manual trigger point release  -6/15 xray shoulder ordered- severe OA, Voltaren gel ordered  -improved this AM  26. Tinea Cruris , improving  -Nystatin powder LOS: 10 days A FACE TO FACE EVALUATION WAS PERFORMED  Jennye Boroughs 06/01/2022, 2:04 PM

## 2022-06-01 NOTE — Progress Notes (Signed)
Patient is refusing CPAP for the night. RT will continue to monitor.

## 2022-06-02 LAB — BASIC METABOLIC PANEL
Anion gap: 9 (ref 5–15)
BUN: 10 mg/dL (ref 8–23)
CO2: 27 mmol/L (ref 22–32)
Calcium: 8.1 mg/dL — ABNORMAL LOW (ref 8.9–10.3)
Chloride: 100 mmol/L (ref 98–111)
Creatinine, Ser: 0.96 mg/dL (ref 0.61–1.24)
GFR, Estimated: >60 mL/min (ref >60)
Glucose, Bld: 111 mg/dL — ABNORMAL HIGH (ref 70–99)
Potassium: 4.4 mmol/L (ref 3.5–5.1)
Sodium: 136 mmol/L (ref 135–145)

## 2022-06-02 LAB — CBC
HCT: 39.1 % (ref 39.0–52.0)
Hemoglobin: 11.7 g/dL — ABNORMAL LOW (ref 13.0–17.0)
MCH: 26.2 pg (ref 26.0–34.0)
MCHC: 29.9 g/dL — ABNORMAL LOW (ref 30.0–36.0)
MCV: 87.7 fL (ref 80.0–100.0)
Platelets: 321 10*3/uL (ref 150–400)
RBC: 4.46 MIL/uL (ref 4.22–5.81)
RDW: 24.1 % — ABNORMAL HIGH (ref 11.5–15.5)
WBC: 8.7 10*3/uL (ref 4.0–10.5)
nRBC: 0 % (ref 0.0–0.2)

## 2022-06-02 NOTE — Plan of Care (Signed)
  Problem: RH PAIN MANAGEMENT Goal: RH STG PAIN MANAGED AT OR BELOW PT'S PAIN GOAL Description: At or below level 4 with prns Outcome: Not Progressing   Problem: RH KNOWLEDGE DEFICIT GENERAL Goal: RH STG INCREASE KNOWLEDGE OF SELF CARE AFTER HOSPITALIZATION Description: Patient and son will be able to manage care using handouts and educational resources independently Outcome: Not Progressing   Problem: RH SAFETY Goal: RH STG ADHERE TO SAFETY PRECAUTIONS W/ASSISTANCE/DEVICE Description: STG Adhere to Safety Precautions With cues Assistance/Device. Outcome: Not Progressing

## 2022-06-02 NOTE — Progress Notes (Signed)
PROGRESS NOTE   Subjective/Complaints:  Pt reports R shoulder pain- still bothering him- they have "worked it up already".  Says sometimes cannot lift arm and sometimes he can.  Wondering if can use LUE now- due to swelling being gone from LUE now.    ROS:  Pt denies SOB, abd pain, CP, N/V/C/D, and vision changes  Objective:   No results found. Recent Labs    06/02/22 0610  WBC 8.7  HGB 11.7*  HCT 39.1  PLT 321   Recent Labs    06/02/22 0610  NA 136  K 4.4  CL 100  CO2 27  GLUCOSE 111*  BUN 10  CREATININE 0.96  CALCIUM 8.1*    Intake/Output Summary (Last 24 hours) at 06/02/2022 1424 Last data filed at 06/02/2022 1321 Gross per 24 hour  Intake 1243 ml  Output 1785 ml  Net -542 ml        Physical Exam: Vital Signs Blood pressure (!) 143/99, pulse 76, temperature 98.2 F (36.8 C), resp. rate 18, height '6\' 6"'$  (1.981 m), weight 90.5 kg, SpO2 98 %.  Physical Exam     General: awake, alert, appropriate, laying in bed supine;  NAD HENT: conjugate gaze; oropharynx moist- B/L fatty deposits on lateral aspect of eyes- chronic CV: regular rate; no JVD Pulmonary: CTA B/L; no W/R/R- good air movement GI: soft, NT, ND, (+)BS- JP drain in place Psychiatric: appropriate Neurological: Ox3  Has bandage on R side of abd with JP drain, with  minimal around of purulent drainage. Extremities: No clubbing, cyanosis. Pulses are 2+. 1+ LE edema Psych: Pt's affect is appropriate. Pt is cooperative Skin: Warm and dry. Small area superficial skin breakdown left sacral area.  Skin tear R buttock, nickel sized. Rt forearm reddend from previous IV.  Fungal rash  b/l groin Neuro: Alert and oriented x3, CN II-XII intact, no focal deficits. Sensory: Intact to LT all 4 extremities Musculoskeletal: Neck supple, cervical back supple. No tenderness. Motor: 5-/5 throughout except LLE 4+/5 and 4/5 R shoulder likely related to pain,  tenderness around R medical scapula and posterior shoulder. 4/5 R biceps flexion also likely limited to pain   Assessment/Plan: 1. Functional deficits which require 3+ hours per day of interdisciplinary therapy in a comprehensive inpatient rehab setting. Physiatrist is providing close team supervision and 24 hour management of active medical problems listed below. Physiatrist and rehab team continue to assess barriers to discharge/monitor patient progress toward functional and medical goals  Care Tool:  Bathing    Body parts bathed by patient: Right arm, Left arm, Chest, Abdomen, Front perineal area, Right upper leg, Left upper leg, Right lower leg, Left lower leg, Face   Body parts bathed by helper: Buttocks     Bathing assist Assist Level: Minimal Assistance - Patient > 75%     Upper Body Dressing/Undressing Upper body dressing   What is the patient wearing?: Pull over shirt    Upper body assist Assist Level: Set up assist    Lower Body Dressing/Undressing Lower body dressing      What is the patient wearing?: Pants     Lower body assist Assist for lower body dressing: Contact  Guard/Touching assist     Chartered loss adjuster assist Assist for toileting: Minimal Assistance - Patient > 75%     Transfers Chair/bed transfer  Transfers assist     Chair/bed transfer assist level: Contact Guard/Touching assist     Locomotion Ambulation   Ambulation assist      Assist level: Contact Guard/Touching assist Assistive device: Walker-rolling Max distance: 75   Walk 10 feet activity   Assist     Assist level: Contact Guard/Touching assist Assistive device: Walker-rolling   Walk 50 feet activity   Assist Walk 50 feet with 2 turns activity did not occur: Safety/medical concerns  Assist level: Contact Guard/Touching assist Assistive device: Walker-rolling    Walk 150 feet activity   Assist Walk 150 feet activity did not occur:  Safety/medical concerns         Walk 10 feet on uneven surface  activity   Assist Walk 10 feet on uneven surfaces activity did not occur: Safety/medical concerns         Wheelchair     Assist Is the patient using a wheelchair?: Yes Type of Wheelchair: Manual    Wheelchair assist level: Minimal Assistance - Patient > 75% Max wheelchair distance: 100    Wheelchair 50 feet with 2 turns activity    Assist        Assist Level: Minimal Assistance - Patient > 75%   Wheelchair 150 feet activity     Assist      Assist Level: Moderate Assistance - Patient 50 - 74%   Blood pressure (!) 143/99, pulse 76, temperature 98.2 F (36.8 C), resp. rate 18, height '6\' 6"'$  (1.981 m), weight 90.5 kg, SpO2 98 %.  Medical Problem List and Plan: 1. Functional deficits secondary to sepsis, intra-abdominal abscess status post percutaneous drain and recent colon surgery             -patient may  shower if covers JP drain site             -ELOS/Goals: 10-12 days mod I to intermittent supervision             -Continue CIR, continue  PT and OT  -Continue to work on endurance with therapy  -est discharge 6/23, mod I Sup  -Con't CIR- PT and OT 2.  Antithrombotics: -DVT/anticoagulation:  Pharmaceutical: Other (comment) Eliquis 5 mg twice daily             -antiplatelet therapy: None 3. Pain Management: Tylenol, Percocet as needed  6/19 -still having R shoulder pain intermittently- con't regimen 4. Mood: LCSW to evaluate and provide emotional support             -antipsychotic agents: n/a 5. Neuropsych: This patient is capable of making decisions on his own behalf.  -Seen by Dr. Sima Matas 6. Skin/Wound Care: Routine skin care checks             --routine drain care 7. Fluids/Electrolytes/Nutrition: Routine ins and outs and follow-up chemistries 8: Intraabdominal abscess s/p perc drain placement- pt knows might go home with drain             --plan Augmentin for 3-4 weeks per  ID             -Drain was placed 5/27, exchanged 5/31 by IR             --plan follow-up CT 6/12             --continue monitoring drain output;  follow-up IR drain clinic  -Drain output continues to decrease  6/19- seen by IR-  9: Colon cancer s/p right hemicolectomy 5/5 with mets to lymph nodes             --follow-up with Dr. Burr Medico; PET scan pending- outpatient? 10: CAD/prior PCI to LAD             --cath on 5/4-stable continue medical management             --continue Plavix and aspirin  -Denies chest pain 11: Recurrent VT/prior ACID: continue mexiletine and amiodorone             --keep K+ > 4.0; Mg > 2.0             --follow-up with Dr. Caryl Comes  -6/15 K+ 4.1 today  -BMP tomorrow 12: Chronic sys and dia HF: continue [place]; Lasix              --daily weight             --follow-up Dr. Sallyanne Kuster             -Continue lasix '40mg'$  BID 6/11 Noted pitting edema +2 on bilateral lower extremities mostly lower legs above ankle.  Per nursing legs were taut overnight. Increased Lasix from 40 mg to 80 mg BID and added 40 mEq K-Dur, trend K+ with qMonday labs. Currently at 3.6, prior to increasing Lasix dose. -Placed in-pt consult to cardiology for Monday follow up on fluid volume status, changes with Lasix, K-dur -6/14 K+ 3.6 today, stable, continue to follow, edema slightly improved -6/16 K+ stable at 4.1 continue current treatment -6/18 Edema improved, decrease lasix to '40mg'$  BID, labs tomorrow  6/19- electrolytes doing well- Cr/BUN looking good.      Latest Ref Rng & Units 06/02/2022    6:10 AM 05/30/2022    5:15 AM 05/28/2022    5:12 AM  BMP  Glucose 70 - 99 mg/dL 111  100  99   BUN 8 - 23 mg/dL '10  10  8   '$ Creatinine 0.61 - 1.24 mg/dL 0.96  0.72  0.75   Sodium 135 - 145 mmol/L 136  135  136   Potassium 3.5 - 5.1 mmol/L 4.4  4.1  3.6   Chloride 98 - 111 mmol/L 100  99  103   CO2 22 - 32 mmol/L '27  28  28   '$ Calcium 8.9 - 10.3 mg/dL 8.1  7.8  7.7    13: Paroxysmal AF: continue  Eliquis 14: Left brachial DVT: continue Eliquis 15: Hypothyroidism; continue Synthroid 16: Hypertension: takes metoprolol and entresto at home.              -He was restarted on entresto, monitor BP, was discussed with cardiology prior to starting 6/12 BP continues to be overall well controlled, continue to monitor, consider restart BB  6/19- Bp slightly elevated, but has been OK- con't regimen and monitor    06/02/2022    5:33 AM 06/01/2022    7:36 PM 06/01/2022    1:40 PM  Vitals with BMI  Weight 199 lbs 8 oz    BMI 09.47    Systolic 096 283 662  Diastolic 99 85 85  Pulse 76 78 80    17: Seizure disorder: no recent seizure activity; continue Keppra 18: OSA: continue CPAP 19: DM: has been on regular diet without need for insulin coverage; A1c = 5.1             --  d/c SSI 20: Hyperlipidemia: continue Zetia, Lipitor 21. Cellulitis? R forearm- already on Unasyn- should cover- will monitor 6/10 continue to monitor R forearm, already on Unasyn 6/11 Reddened area on R forearm, but skin intact. -improved 22. Anemia             -5/14 anemia stable with HGB 10.6  -5/16 HGB stable at 10.7  -Repeat cbc tomorrow 23. Hypoalbuminemia             -Appears to be eating more of his meals, discussed protein intake, continue to follow 24. Anxiety 6/11 Per nursing and pt, he is feeling very anxious today with concerns about medical prognosis. Ordered low dose 0.25 mg Xanax prn for mild anxiety, prn and Neuropsych. evaluation for Monday. 25. R Scapula pain with Rhomboid tenderness  -Therapy completed manual trigger point release  -6/15 xray shoulder ordered- severe OA, Voltaren gel ordered  -improved this AM  26. Tinea Cruris , improving  -Nystatin powder 27. Can use LUE for blood draws- made the order for pt- since swelling in LUE resolved.    LOS: 11 days A FACE TO FACE EVALUATION WAS PERFORMED  Gregory [name] 06/02/2022, 2:24 PM

## 2022-06-02 NOTE — Progress Notes (Signed)
Occupational Therapy Session Note  Patient Details  Name: Waleed Dettman MRN: [id number] Date of Birth: 1961/02/24  Today's Date: 06/02/2022 OT Individual Time: 0930-1000, 1530-1600 OT Individual Time Calculation (min): 30 min & 30 min   Short Term Goals: Week 2:  OT Short Term Goal 1 (Week 2): STGs = LTGs  Skilled Therapeutic Interventions/Progress Updates:     Session 1: Upon OT arrival, pt semi recumbent in bed and reports 3/10 pain in lower back. Pt agreeable to OT session and request to wash up at sink. Pt completes stand step transfer with RW and Supervision. Pt setup infornt of sink to complete oral care and wash face Mod I. Pt then completes stand step transfer with RW and Supervision back to bed. Pt sits EOB to complete B UE exercises for 1x10 reps. Pt completes shoulder flex/ext, shoulder abduction/adduction, scapular protraction/retraction, elbow flex/ext, supination/pronation, wrist ext/flex using bottle of body armor for min resistance. 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 Pt making progress towards stated OT goals and continues to benefit from OT services to achieve highest level of independence. Pt completes sit to supine transfer with Supervision. Pt left in bed with all needs met.     Session 2:  Upon OT arrival, pt seated in w/c with friend present. Pt reports no pain and request to use the bathroom. Pt completes sit to stand transfer with Supervision and ambulates to bathroom with RW and Supervision. Pt completes toilet transfer with Supervision, performs toileting with Supervision and ambulates back to his chair with RW and Supervision. Pt propelled himself via w/c from his room to the rehab entrance before therapist provided total A to propel to rehab apartment. Pt retrieved 4 items from pantry one at a time performing w/c mobility Mod I and transports items  to tabletop near pantry to improve independence during IADLs. Pt requires short seated rest breaks during task. Pt returns all items into pantry with min difficulty. Pt demonstrates decreased endurance during task. Pt returned to his room via total A w/c level and completes stand step transfer with RW and Supervision. Pt completes sit to supine transfer with SBA. Pt making progress towards stated OT goals and continues to benefit from OT services to achieve highest level of independence. Pt left in bed with all needs met.   Therapy Documentation Precautions:  Precautions Precautions: Fall Precaution Comments: watch O2 and HR, jp drain Restrictions Weight Bearing Restrictions: No    Therapy/Group: Individual Therapy  Marvetta Gibbons 06/02/2022, 9:45 AM

## 2022-06-02 NOTE — Progress Notes (Signed)
Physical Therapy Session Note  Patient Details  Name: Gregory [name] MRN: [id number] Date of Birth: [birth year]  Today's Date: 06/02/2022 PT Individual Time: 1438-8875 PT Individual Time Calculation (min): 30 min   Short Term Goals: Week 1:  PT Short Term Goal 1 (Week 1): Pt will perform bed mobility with min PT Short Term Goal 1 - Progress (Week 1): Met PT Short Term Goal 2 (Week 1): Pt will transfer to [facility] with CGA PT Short Term Goal 2 - Progress (Week 1): Met PT Short Term Goal 3 (Week 1): Pt will ascend 4steps with min assist PT Short Term Goal 3 - Progress (Week 1): Met PT Short Term Goal 4 (Week 1): Pt will ambulate 27f with min assist PT Short Term Goal 4 - Progress (Week 1): Met PT Short Term Goal 5 (Week 1): Pt will propell WC 1047fwith supervision assist PT Short Term Goal 5 - Progress (Week 1): Met Week 2:  PT Short Term Goal 1 (Week 2): =LTGs d/t ELOS  Skilled Therapeutic Interventions/Progress Updates:  Patient supine in bed on entrance to room. Patient alert and agreeable to PT session. Relates poor sleep overnight and is fatigued from full morning of therapy already. Pt relates that bed is not long enough for true comfort. Feet are pressed against foot board.   Patient with mild pain complaint throughout session at low back.    Therapeutic Exercise: Patient performed the gentle Bil SKTC with care for any increased pain or discomfort to abdomen. Pt is able to determine stretch to lower back and hold each side for 30 sec x2. Increased tightness noted in L side. Urged pt to perform when in bed in order to improve muscle tightness as well as to open facet joints in spine for improved comfort throughout the day.   Therapeutic Activity: Bed Mobility: Patient performed supine --> sit with light minA  and vc/ tc required for log roll technique to R side. Requires assist with push up from bed using RUE. In return to supine, requires visual and verbal instruction to  perform return to sidelying prior to rolling to supine. However, pt performs straight to supine with LE in hooklying requiring Min/ ModA for BLE. Transfers: Patient performed sit<>stand and stand pivot transfers throughout session with close supervision. Provided verbal cues for ambulatory transfer to w/c across room.  Pt's mattress adjusted higher in bed and pillows placed in space at foot of bed for improved mattress support. Pt is then able to ambulate with no AD and Bil HHA back to bed with minimal pressure into therapist's UE. On return to bed, pt is able to adjust position higher in bed with BLE assist and is better positioned on mattress with no touch of feet to foot board. Improved comfort from pt.  Patient supine  in bed at end of session with brakes locked, bed alarm set, and all needs within reach.   Therapy Documentation Precautions:  Precautions Precautions: Fall Precaution Comments: watch O2 and HR, jp drain Restrictions Weight Bearing Restrictions: No General:   Vital Signs:  Pain:  Mild low back pain complaint addressed with repositioning and stretching.   Therapy/Group: Individual Therapy  JuAlger SimonsT, DPT, CSRS 06/02/2022, 10:30 AM

## 2022-06-02 NOTE — Progress Notes (Signed)
Physical Therapy Session Note  Patient Details  Name: Gregory [name] MRN: [id number] Date of Birth: May 03, 1961  Today's Date: 06/02/2022 PT Individual Time: 1031-5945; 1430-1530 PT Individual Time Calculation (min): 45 min , 60 min  Short Term Goals: Week 1:  PT Short Term Goal 1 (Week 1): Pt will perform bed mobility with min PT Short Term Goal 1 - Progress (Week 1): Met PT Short Term Goal 2 (Week 1): Pt will transfer to [facility] with CGA PT Short Term Goal 2 - Progress (Week 1): Met PT Short Term Goal 3 (Week 1): Pt will ascend 4steps with min assist PT Short Term Goal 3 - Progress (Week 1): Met PT Short Term Goal 4 (Week 1): Pt will ambulate 49f with min assist PT Short Term Goal 4 - Progress (Week 1): Met PT Short Term Goal 5 (Week 1): Pt will propell WC 1024fwith supervision assist PT Short Term Goal 5 - Progress (Week 1): Met Week 2:  PT Short Term Goal 1 (Week 2): =LTGs d/t ELOS  Skilled Therapeutic Interventions/Progress Updates:  Tx 1:  Pt resting in bed.  He reported pain 3/10 R shoulder and LB.  Repositioning offered during session to address pain, and pt recommended asking Nsg to apply Voltaren gel.   Supine> sit in flat bed , no rails, to R with max cues for technique and min assist.  Pt benefits from rolling fully to the R before sliding bil LEs off of bed.  Sit> supine with mod assist for bil LEs.    PT donned TEDs and socks.    Therapeutic exercises performed with LEs and trunk to increase strength for functional mobility: 20 x 1 each : bil heel/toe raises, bil scapular retraction within pain -free range for R shoulder.  Standing with bil UE light support- 10 x 2 bil mini squats focusing on L knee stance control. Pt c/o lightheadedness during standing exercises, limiting duration.  Sit> stand from raised bed, CGA.  Gait training on level tile, RW x 75" with CGa .  Pt reported that his L knee buckled x 2 on straight path; no LOB.   Pt requested lying down in  bed before next session.  At end of session, pt resting in bed with needs at hand and bed alarm set.  TX 2:  Pt resting in bed.  He :BP 3/10; he plans to ask for meds when he is done with therapies.  Bed mobility training in flat bed, no rails, for rolling L/R with supervision and cues.  In R/L side lying , bil hip flexion/extension  x 10. In supine hook lying, bil hip adduction with bridging, 10 x 2 with significant rest betewen sets.  Pt education for relaxation imagery and diaphragmatic breathing during rest (and when try to fall asleep), with fair carry over.   Gait training in home setting with RW, CGA, x 25'.  Up/down (4) 6" high steps, bil rails, x 2 with significant seated rest break  between bouts.  CGA and cues for sequencing, due to L knee instability; no buckling occurred.   At end of session, pt seated in wc with needs at hand and visitor in room.        Therapy Documentation Precautions:  Precautions Precautions: Fall Precaution Comments: watch O2 and HR, jp drain Restrictions Weight Bearing Restrictions: No       Therapy/Group: Individual Therapy  Chanele Douglas 06/02/2022, 9:39 AM

## 2022-06-02 NOTE — Progress Notes (Signed)
RUQ Percutaneous fluid collection drain placed 5/31 Seen in follow-up today Drain Location: RUQ Size: Fr size: 14 Fr Date of placement: 5/27; exchanged 5/31  Currently to: Drain collection device: suction bulb 24 hour output:  Output by Drain (mL) 05/31/22 0701 - 05/31/22 1900 05/31/22 1901 - 06/01/22 0700 06/01/22 0701 - 06/01/22 1900 06/01/22 1901 - 06/02/22 0700 06/02/22 0701 - 06/02/22 1344  Closed System Drain Right RLQ  14 Fr.  50  80     Interval imaging/drain manipulation:  None  Current examination: Flushes/aspirates easily.  Insertion site unremarkable. 80 mL output last 24 hr  Plan: Continue TID flushes with 5 cc NS. Record output Q shift. Dressing changes QD or PRN if soiled.  Call IR APP or on call IR MD if difficulty flushing or sudden change in drain output.  Repeat imaging/possible drain injection once output < 10 mL/QD (excluding flush material.)  Discharge planning: Please contact IR APP or on call IR MD prior to patient d/c to ensure appropriate follow up plans are in place. Typically patient will follow up with IR clinic 10-14 days post d/c for repeat imaging/possible drain injection. IR scheduler will contact patient with date/time of appointment. Patient will need to flush drain QD with 5 cc NS, record output QD, dressing changes every 2-3 days or earlier if soiled.  Will place IR outpatient drain order.  IR will continue to follow - please call with questions or concerns.  Ascencion Dike PA-C Interventional Radiology 06/02/2022 1:45 PM

## 2022-06-02 NOTE — Progress Notes (Signed)
Patient refusing CPAP at this time. RT will continue to monitor.

## 2022-06-03 ENCOUNTER — Inpatient Hospital Stay (HOSPITAL_COMMUNITY): Payer: PRIVATE HEALTH INSURANCE

## 2022-06-03 DIAGNOSIS — I82622 Acute embolism and thrombosis of deep veins of left upper extremity: Secondary | ICD-10-CM

## 2022-06-03 NOTE — Progress Notes (Deleted)
Electrophysiology Office Note Date: 06/03/2022  ID:  Gregory [name], Gregory [name] [birth year], MRN [id number]  PCP: Patient, No Pcp Per Primary Cardiologist: Sanda Klein, MD Electrophysiologist: Virl Axe, MD   CC: Routine ICD follow-up  Gregory [name] is a 61 y.o. male seen today for Virl Axe, MD for {Blank single:19197::"cardiac clearance","post hospital follow up","acute visit due to ***","routine electrophysiology followup"}.  Since {Blank single:19197::"last being seen in our clinic","discharge from hospital"} the patient reports doing ***.  he denies chest pain, palpitations, dyspnea, PND, orthopnea, nausea, vomiting, dizziness, syncope, edema, weight gain, or early satiety. {He/she (caps):30048} has not had ICD shocks.   Device History: Medtronic Dual Chamber ICD implanted 2005, 2010 and gen change for 2018    2017 to have slow VT prompting reduction of his VT zone rate to 150bpm.   Oct 2021, VT with appropriate therapies   He had stopped drinking (heavily at least).  He noted previously had been on amiodarone and mexiletine , the mexiletine stopped 2/2 GI symptoms, and the amiodarone stopped 2/2 abn ALk phos   Oct 2021 > amio restarted for VT  Past Medical History:  Diagnosis Date   AICD (automatic cardioverter/defibrillator) present 2005   CAD (coronary artery disease) 12/01/2013   Chronic combined systolic and diastolic CHF, NYHA class 1 ([facility]) 12/01/2013   Erectile dysfunction 12/01/2013   HTN (hypertension) 12/01/2013   Hyperlipidemia 12/01/2013   Ischemic cardiomyopathy 12/01/2013   Presence of permanent cardiac pacemaker    Sleep apnea    Past Surgical History:  Procedure Laterality Date   ACHILLES TENDON REPAIR     lft foot   BIOPSY  04/16/2022   Procedure: BIOPSY;  Surgeon: Yetta Flock, MD;  Location: [place];  Service: Gastroenterology;;   CARDIAC CATHETERIZATION N/A 05/05/2016   Procedure: Left Heart Cath and Coronary  Angiography;  Surgeon: Leonie Man, MD;  Location: [place] [facility];  Service: Cardiovascular;  Laterality: N/A;   CARDIAC DEFIBRILLATOR PLACEMENT     CARDIOVERSION N/A 09/13/2020   Procedure: CARDIOVERSION;  Surgeon: Werner Lean, MD;  Location: [place] [facility];  Service: Cardiovascular;  Laterality: N/A;   CARDIOVERSION N/A 09/19/2020   Procedure: CARDIOVERSION;  Surgeon: [name], MD;  Location: [facility] ENDOSCOPY;  Service: Cardiovascular;  Laterality: N/A;   COLON RESECTION N/A 04/18/2022   Procedure: HAND ASSISTED LAPAROSCOPIC RIGHT COLON RESECTION;  Surgeon: Felicie Morn, MD;  Location: [place];  Service: General;  Laterality: N/A;   COLONOSCOPY WITH PROPOFOL N/A 04/16/2022   Procedure: COLONOSCOPY WITH PROPOFOL;  Surgeon: Yetta Flock, MD;  Location: [place];  Service: Gastroenterology;  Laterality: N/A;   EP IMPLANTABLE DEVICE N/A 12/19/2016   Procedure: ICD Generator Changeout;  Surgeon: [name], MD;  Location: [place] [facility];  Service: Cardiovascular;  Laterality: N/A;   ESOPHAGOGASTRODUODENOSCOPY N/A 04/16/2022   Procedure: ESOPHAGOGASTRODUODENOSCOPY (EGD);  Surgeon: Yetta Flock, MD;  Location: [facility] ENDOSCOPY;  Service: Gastroenterology;  Laterality: N/A;   IR ANGIOGRAM SELECTIVE EACH ADDITIONAL VESSEL  05/09/2022   IR ANGIOGRAM VISCERAL SELECTIVE  05/09/2022   IR CATHETER TUBE CHANGE  05/14/2022   IR EMBO ART  VEN HEMORR LYMPH EXTRAV  INC GUIDE ROADMAPPING  05/09/2022   IR IMAGE GUIDED DRAINAGE PERCUT CATH  PERITONEAL RETROPERIT  05/09/2022   IR US GUIDE VASC ACCESS RIGHT  05/09/2022   LEFT HEART CATH AND CORONARY ANGIOGRAPHY N/A 04/17/2022   Procedure: LEFT HEART CATH AND CORONARY ANGIOGRAPHY;  Surgeon: Belva Crome, MD;  Location:  [place] [facility];  Service: Cardiovascular;  Laterality: N/A;   POLYPECTOMY  04/16/2022   Procedure: POLYPECTOMY;  Surgeon: Yetta Flock, MD;  Location: [facility] ENDOSCOPY;  Service: Gastroenterology;;    RIGHT/LEFT HEART CATH AND CORONARY ANGIOGRAPHY N/A 09/24/2020   Procedure: RIGHT/LEFT HEART CATH AND CORONARY ANGIOGRAPHY;  Surgeon: Lorretta Harp, MD;  Location: [place] [facility];  Service: Cardiovascular;  Laterality: N/A;   SUBMUCOSAL TATTOO INJECTION  04/16/2022   Procedure: SUBMUCOSAL TATTOO INJECTION;  Surgeon: Yetta Flock, MD;  Location: [facility] ENDOSCOPY;  Service: Gastroenterology;;   WRIST TENODESIS      No current facility-administered medications for this visit.   No current outpatient medications on file.   Facility-Administered Medications Ordered in Other Visits  Medication Dose Route Frequency Provider Last Rate Last Admin   0.9 %  sodium chloride infusion  250 mL Intravenous PRN Barbie Banner, PA-C       acetaminophen (TYLENOL) tablet 325-650 mg  325-650 mg Oral Q4H PRN Barbie Banner, PA-C   650 mg at 05/31/22 0036   ALPRAZolam (XANAX) tablet 0.25 mg  0.25 mg Oral TID PRN Luetta Nutting, FNP   0.25 mg at 06/02/22 2223   alum & mag hydroxide-simeth (MAALOX/MYLANTA) 200-200-20 MG/5ML suspension 30 mL  30 mL Oral Q4H PRN Barbie Banner, PA-C       amiodarone (PACERONE) tablet 400 mg  400 mg Oral BID Barbie Banner, PA-C   400 mg at 06/03/22 0827   amoxicillin-clavulanate (AUGMENTIN) 875-125 MG per tablet 1 tablet  1 tablet Oral Q12H Barbie Banner, PA-C   1 tablet at 06/03/22 8315   apixaban (ELIQUIS) tablet 5 mg  5 mg Oral BID Barbie Banner, PA-C   5 mg at 06/03/22 1761   atorvastatin (LIPITOR) tablet 80 mg  80 mg Oral QPM Barbie Banner, PA-C   80 mg at 06/02/22 1652   bisacodyl (DULCOLAX) suppository 10 mg  10 mg Rectal Daily PRN Barbie Banner, PA-C       dapagliflozin propanediol (FARXIGA) tablet 10 mg  10 mg Oral Daily Barbie Banner, PA-C   10 mg at 06/03/22 6073   diclofenac Sodium (VOLTAREN) 1 % topical gel 2 g  2 g Topical QID Jennye Boroughs, MD   2 g at 06/03/22 0830   diphenhydrAMINE (BENADRYL) 12.5 MG/5ML elixir 12.5-25 mg  12.5-25 mg  Oral Q6H PRN Barbie Banner, PA-C       diphenhydrAMINE-zinc acetate (BENADRYL) 2-0.1 % cream   Topical TID PRN Barbie Banner, PA-C   Given at 05/29/22 2315   docusate sodium (COLACE) capsule 100 mg  100 mg Oral BID Barbie Banner, PA-C   100 mg at 06/03/22 7106   ezetimibe (ZETIA) tablet 10 mg  10 mg Oral Daily Barbie Banner, PA-C   10 mg at 06/03/22 2694   feeding supplement (ENSURE ENLIVE / ENSURE PLUS) liquid 237 mL  237 mL Oral BID BM Barbie Banner, PA-C   237 mL at 06/02/22 1310   furosemide (LASIX) tablet 40 mg  40 mg Oral BID Jennye Boroughs, MD   40 mg at 06/03/22 0827   guaiFENesin-dextromethorphan (ROBITUSSIN DM) 100-10 MG/5ML syrup 5-10 mL  5-10 mL Oral Q6H PRN Barbie Banner, PA-C       levETIRAcetam (KEPPRA) tablet 500 mg  500 mg Oral BID Barbie Banner, PA-C   500 mg at 06/03/22 8546   magnesium hydroxide (MILK OF MAGNESIA) suspension 30 mL  30 mL Oral Daily PRN Barbie Banner, PA-C       methocarbamol (ROBAXIN) tablet 500 mg  500 mg Oral Q6H PRN Barbie Banner, PA-C   500 mg at 06/03/22 7322   mexiletine (MEXITIL) capsule 150 mg  150 mg Oral Q8H Barbie Banner, PA-C   150 mg at 06/03/22 0254   nystatin (MYCOSTATIN/NYSTOP) topical powder   Topical BID Jennye Boroughs, MD   Given at 06/03/22 2706   oxyCODONE-acetaminophen (PERCOCET/ROXICET) 5-325 MG per tablet 1-2 tablet  1-2 tablet Oral Q4H PRN Barbie Banner, PA-C   2 tablet at 06/03/22 0238   pantoprazole (PROTONIX) EC tablet 40 mg  40 mg Oral BID Barbie Banner, PA-C   40 mg at 06/03/22 2376   polyethylene glycol (MIRALAX / GLYCOLAX) packet 17 g  17 g Oral BID Barbie Banner, PA-C   17 g at 06/02/22 2041   potassium chloride SA (KLOR-CON M) CR tablet 20 mEq  20 mEq Oral Daily Luetta Nutting, FNP   20 mEq at 06/03/22 2831   prochlorperazine (COMPAZINE) tablet 5-10 mg  5-10 mg Oral Q6H PRN Barbie Banner, PA-C       Or   prochlorperazine (COMPAZINE) injection 5-10 mg  5-10 mg Intramuscular Q6H PRN  Barbie Banner, PA-C       Or   prochlorperazine (COMPAZINE) suppository 12.5 mg  12.5 mg Rectal Q6H PRN Setzer, Edman Circle, PA-C       sacubitril-valsartan (ENTRESTO) 49-51 mg per tablet  1 tablet Oral BID Barbie Banner, PA-C   1 tablet at 06/03/22 5176   sodium phosphate (FLEET) 7-19 GM/118ML enema 1 enema  1 enema Rectal Once PRN Barbie Banner, PA-C       traZODone (DESYREL) tablet 25-50 mg  25-50 mg Oral QHS PRN Barbie Banner, PA-C   50 mg at 06/02/22 2223    Allergies:   Patient has no known allergies.   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    Right handed   Social Determinants of Health   Financial Resource Strain: High Risk (05/06/2022)   Overall Financial Resource Strain (CARDIA)    Difficulty of Paying Living Expenses: Hard  Food Insecurity: Not on file  Transportation Needs: Not on file  Physical Activity: Not on file  Stress: Not on file  Social Connections: Not on file  Intimate Partner Violence: Not on file  Family History: Family History  Problem Relation Age of Onset   Heart disease Mother    Brain  cancer Father    Hypertension Daughter     Review of Systems: All other systems reviewed and are otherwise negative except as noted above.   Physical Exam: There were no vitals filed for this visit.   GEN- The patient is well appearing, alert and oriented x 3 today.   HEENT: normocephalic, atraumatic; sclera clear, conjunctiva pink; hearing intact; oropharynx clear; neck supple, no JVP Lymph- no cervical lymphadenopathy Lungs- Clear to ausculation bilaterally, normal work of breathing.  No wheezes, rales, rhonchi Heart- Regular rate and rhythm, no murmurs, rubs or gallops, PMI not laterally displaced GI- soft, non-tender, non-distended, bowel sounds present, no hepatosplenomegaly Extremities- no clubbing or cyanosis. No edema; DP/PT/radial pulses 2+ bilaterally MS- no significant deformity or atrophy Skin- warm and dry, no rash or lesion; ICD pocket well healed Psych- euthymic mood, full affect Neuro- strength and sensation are intact  ICD interrogation- reviewed in detail today,  See PACEART report  EKG:  EKG is not ordered today.  Recent Labs: 04/11/2022: B Natriuretic Peptide 616.2 05/08/2022: TSH 11.024 05/22/2022: Magnesium 2.1 05/23/2022: ALT 24 06/02/2022: BUN 10; Creatinine, Ser 0.96; Hemoglobin 11.7; Platelets 321; Potassium 4.4; Sodium 136   Wt Readings from Last 3 Encounters:  06/03/22 206 lb 2.1 oz (93.5 kg)  05/08/22 225 lb 1.4 oz (102.1 kg)  04/18/22 211 lb (95.7 kg)     Other studies Reviewed: Additional studies/ records that were reviewed today include: Previous EP office notes.   Assessment and Plan:  1.  Chronic systolic dysfunction s/p Medtronic dual chamber ICD  euvolemic today Stable on an appropriate medical regimen Normal ICD function See Pace Art report No changes today  2. VT  With recent storm, likely provoked this time by acute abdominal process ATPs are working  Continue amiodarone *** Continue mexiletine    3. CAD Stable by cath  04/17/2022  Denies s/s ischemia     4. Paroxysmal Afib NSR currently Continue eliquis On amiodarone as well for VT as above.    5. H/o DVT On eliquis   6. Abdominal abscess 7. Sepsis 8. Ileus 9. Splenic artery pseudoaneurysm 10. Colon cancer S/p R hemicolectomy 04/18/22 Now s/p percutaneous drain, and embolization of the splenic artery.     Current medicines are reviewed at length with the patient today.   =  Labs/ tests ordered today include: *** No orders of the defined types were placed in this encounter.    Disposition:   Follow up with {Blank single:19197::"Dr. Allred","Dr. Arlan Organ. Klein","Dr. Camnitz","Dr. Lambert","EP APP"} in {Blank single:19197::"2 weeks","4 weeks","3 months","6 months","12 months","as usual post gen change"}    Signed, Annamaria Helling  06/03/2022 11:21 AM  [facility] [address] [place]  [place] [address] [phone] (office) [phone] (fax)

## 2022-06-03 NOTE — Progress Notes (Signed)
Physical Therapy Session Note  Patient Details  Name: Gregory [name] MRN: [id number] Date of Birth: Apr 04, 1961  Today's Date: 06/03/2022 PT Individual Time: 0900-1000 PT Individual Time Calculation (min): 60 min   Short Term Goals: Week 2:  PT Short Term Goal 1 (Week 2): =LTGs d/t ELOS  Skilled Therapeutic Interventions/Progress Updates:    Pt received seated in bed, agreeable to PT session. Pt reports 3/10 low back pain and R shoulder pain, declines any intervention at this time. Seated in bed to sitting EOB with Supervision, increased time needed, use of bedrail and HOB elevated. Sit to stand and transfer with RW at Supervision level during session. Ascend/descend 8 x 6" stairs with 2 handrails and CGA for balance, initially with alternating gait pattern then transition to step-to gait pattern with onset of fatigue, x 2 reps, then one rep x 4 stairs. Pt requires extended seated rest break between each bout of stair navigation. Ambulation x 230 ft, x 40 ft, and x 30 ft with RW and CGA for balance. Pt has some impulsivity with onset of fatigue and needs cues to safely sit in w/c. Standing balance with RW and CGA performing horse shoe toss. Pt left seated in w/c in room with needs in reach at end of session.  Therapy Documentation Precautions:  Precautions Precautions: Fall Precaution Comments: watch O2 and HR, jp drain Restrictions Weight Bearing Restrictions: No       Therapy/Group: Individual Therapy   Excell Seltzer, PT, DPT, CSRS 06/03/2022, 12:25 PM

## 2022-06-03 NOTE — Progress Notes (Signed)
Occupational Therapy Session Note  Patient Details  Name: Gregory [name] MRN: [id number] Date of Birth: 22-Aug-1961  Today's Date: 06/03/2022 OT Individual Time: 1015-1105 OT Individual Time Calculation (min): 50 min    Short Term Goals: Week 2:  OT Short Term Goal 1 (Week 2): STGs = LTGs  Skilled Therapeutic Interventions/Progress Updates:    Patient received up in wheelchair with elevating leg rests on.  Patient eager to take a shower but states his legs are very tired from previous PT session.  Patient with limited ability to navigate in room to gather clothing.  Patient able to walk to bathroom, then to shower with contact guard assist due to significant fatigue, LE weakness after PT.  Able to direct steps of shower.  Provided slight bend to [facility] Sponge to allow patient more independence in washing his own back.  Patient stood to wash bottom and leaned against wall of shower for stability, walk back to chair - patient less able to achieve upright standing, and had heavy lean toward right and forward flexion without loss of balance.  Patient unable to don compression hose and reports "I have people" to help with this after discharge.  Patient left up in wheelchair per his request.  Nurse call bell and personal items within reach.    Therapy Documentation Precautions:  Precautions Precautions: Fall Precaution Comments: watch O2 and HR, jp drain Restrictions Weight Bearing Restrictions: No  Pain: Pain Assessment Pain Scale: 0-10 Pain Score: 3  Pain Type: Chronic pain Pain Location: Back Pain Orientation: Lower Pain Descriptors / Indicators: Aching;Constant Pain Frequency: Constant Pain Onset: On-going Patients Stated Pain Goal: 0 Pain Intervention(s): Medication (See eMAR) Multiple Pain Sites: No ADL:  Therapy/Group: Individual Therapy  Mariah Milling 06/03/2022, 11:40 AM

## 2022-06-03 NOTE — Plan of Care (Signed)
  Problem: RH PAIN MANAGEMENT Goal: RH STG PAIN MANAGED AT OR BELOW PT'S PAIN GOAL Description: At or below level 4 with prns Outcome: Not Progressing   Problem: RH SAFETY Goal: RH STG ADHERE TO SAFETY PRECAUTIONS W/ASSISTANCE/DEVICE Description: STG Adhere to Safety Precautions With cues Assistance/Device. Outcome: Not Progressing   Problem: RH SKIN INTEGRITY Goal: RH STG ABLE TO PERFORM INCISION/WOUND CARE W/ASSISTANCE Description: STG Able To Perform Incision/Wound Care With min Assistance. Outcome: Not Progressing

## 2022-06-03 NOTE — Progress Notes (Signed)
Pt refused CPAP for tonight.  

## 2022-06-03 NOTE — Progress Notes (Signed)
Physical Therapy Session Note  Patient Details  Name: Gregory [name] MRN: [id number] Date of Birth: August 09, 1961  Today's Date: 06/03/2022 PT Individual Time: 9450-3888 PT Individual Time Calculation (min): 57 min   Short Term Goals: Week 2:  PT Short Term Goal 1 (Week 2): =LTGs d/t ELOS  Skilled Therapeutic Interventions/Progress Updates:     Pt received supine in bed and agrees to therapy. Reports fatigue but no pain reported. Pt performs supine to sit with cues for hand placement and sequencing, without physical assistance required. Sit to stand from elevated bed with RW and CGA, with cues for sequencing and positioning for stand pivot to WC. WC transport to gym for time management. Pt performs stand pivot transfer to Nustep with RW and CGA. Pt completes Nustep for strength and endurance training. Pt completes x5 minutes at workload of 5 with average steps per minute ~45. Pt takes rest break prior to completing additional 5:00 only using legs for increased strengthening of lower body.   Pt takes extended seated rest break prior to additional mobility. Pt then performs sit to stand to RW and ambulates x195' with RW and CGA, with cues for increased proximity to RW for safety, and monitory level of exertion for safety awareness. Pt verbalizes that L knee was feeling "buckly" toward end of gait.  Pt requests to perform upper body exercises to work on shoulders. Pt completes 2x10 shoulder presses, initially with 2 lb bar and then increasing to 4 lb bar. PT demonstrates exercises for correct performance. Pt completes bar raises with extended arms, cued to complete to fatigue or when form "breaks down". Pt able to complete x4, x3, and x5. WC transport back to room. Stand step back to bed with RW and cues for positioning. Pt left supine with alarm intact and all needs within reach.  Therapy Documentation Precautions:  Precautions Precautions: Fall Precaution Comments: watch O2 and HR, jp  drain Restrictions Weight Bearing Restrictions: No   Therapy/Group: Individual Therapy  Breck Coons, PT, DPT 06/03/2022, 5:36 PM

## 2022-06-03 NOTE — Progress Notes (Addendum)
PROGRESS NOTE   Subjective/Complaints:  Pt asks about blood draws in LUE. Yesterday order placed that blood draws can be done in this arm.    ROS:  Pt denies SOB, abd pain, CP,  abdominal pain, N/V/C/D, and vision changes  Objective:   No results found. Recent Labs    06/02/22 0610  WBC 8.7  HGB 11.7*  HCT 39.1  PLT 321    Recent Labs    06/02/22 0610  NA 136  K 4.4  CL 100  CO2 27  GLUCOSE 111*  BUN 10  CREATININE 0.96  CALCIUM 8.1*     Intake/Output Summary (Last 24 hours) at 06/03/2022 1221 Last data filed at 06/03/2022 1156 Gross per 24 hour  Intake 595 ml  Output 2860 ml  Net -2265 ml         Physical Exam: Vital Signs Blood pressure (!) 136/99, pulse 76, temperature 97.7 F (36.5 C), resp. rate 18, height '6\' 6"'$  (1.981 m), weight 93.5 kg, SpO2 100 %.  Physical Exam     General: awake, alert, appropriate, laying in bed supine;  NAD HENT: conjugate gaze; oropharynx moist- B/L fatty deposits on lateral aspect of eyes- chronic CV: regular rate; no JVD Pulmonary: CTA B/L; no W/R/R- good air movement GI: soft, NT, ND, (+)BS- JP drain in place Psychiatric: appropriate Neurological: Ox3  Has bandage on R side of abd with JP drain, with  minimal around of purulent drainage. Extremities: No clubbing, cyanosis. Pulses are 2+. 1+ LE edema Psych: Pt's affect is appropriate. Pt is cooperative Skin: Warm and dry. Small area superficial skin breakdown left sacral area.  Skin tear R buttock, nickel sized. Rt forearm reddend from previous IV.  Fungal rash  b/l groin- improved Neuro: Alert and oriented x3, CN II-XII intact, no focal deficits. Sensory: Intact to LT all 4 extremities Musculoskeletal: Neck supple, cervical back supple. No tenderness. Motor: 5-/5 throughout except LLE 4+/5 and 4/5 R shoulder likely related to pain, tenderness around R medical scapula and posterior shoulder. 4/5 R biceps  flexion also likely limited to pain   Assessment/Plan: 1. Functional deficits which require 3+ hours per day of interdisciplinary therapy in a comprehensive inpatient rehab setting. Physiatrist is providing close team supervision and 24 hour management of active medical problems listed below. Physiatrist and rehab team continue to assess barriers to discharge/monitor patient progress toward functional and medical goals  Care Tool:  Bathing    Body parts bathed by patient: Right arm, Left arm, Chest, Abdomen, Front perineal area, Buttocks, Right upper leg, Left upper leg, Face, Left lower leg, Right lower leg   Body parts bathed by helper: Buttocks     Bathing assist Assist Level: Minimal Assistance - Patient > 75%     Upper Body Dressing/Undressing Upper body dressing   What is the patient wearing?: Pull over shirt    Upper body assist Assist Level: Set up assist    Lower Body Dressing/Undressing Lower body dressing      What is the patient wearing?: Pants     Lower body assist Assist for lower body dressing: Contact Guard/Touching assist     Toileting Toileting    Toileting  assist Assist for toileting: Supervision/Verbal cueing     Transfers Chair/bed transfer  Transfers assist     Chair/bed transfer assist level: Contact Guard/Touching assist     Locomotion Ambulation   Ambulation assist      Assist level: Contact Guard/Touching assist Assistive device: Walker-rolling Max distance: 75   Walk 10 feet activity   Assist     Assist level: Contact Guard/Touching assist Assistive device: Walker-rolling   Walk 50 feet activity   Assist Walk 50 feet with 2 turns activity did not occur: Safety/medical concerns  Assist level: Contact Guard/Touching assist Assistive device: Walker-rolling    Walk 150 feet activity   Assist Walk 150 feet activity did not occur: Safety/medical concerns         Walk 10 feet on uneven surface   activity   Assist Walk 10 feet on uneven surfaces activity did not occur: Safety/medical concerns         Wheelchair     Assist Is the patient using a wheelchair?: Yes Type of Wheelchair: Manual    Wheelchair assist level: Minimal Assistance - Patient > 75% Max wheelchair distance: 100    Wheelchair 50 feet with 2 turns activity    Assist        Assist Level: Minimal Assistance - Patient > 75%   Wheelchair 150 feet activity     Assist      Assist Level: Moderate Assistance - Patient 50 - 74%   Blood pressure (!) 136/99, pulse 76, temperature 97.7 F (36.5 C), resp. rate 18, height '6\' 6"'$  (1.981 m), weight 93.5 kg, SpO2 100 %.  Medical Problem List and Plan: 1. Functional deficits secondary to sepsis, intra-abdominal abscess status post percutaneous drain and recent colon surgery             -patient may  shower if covers JP drain site             -ELOS/Goals: 10-12 days mod I to intermittent supervision             -Continue CIR, continue  PT and OT  -Continue to work on endurance with therapy  -est discharge 6/23, mod I Sup  -Con't CIR- PT and OT 2.  Antithrombotics: -DVT/anticoagulation:  Pharmaceutical: Other (comment) Eliquis 5 mg twice daily             -antiplatelet therapy: None  -Prior diagnosed LUE DVT- on eliquis, swelling has resolved 3. Pain Management: Tylenol, Percocet as needed  6/19 -still having R shoulder pain intermittently- con't regimen 4. Mood: LCSW to evaluate and provide emotional support             -antipsychotic agents: n/a 5. Neuropsych: This patient is capable of making decisions on his own behalf.  -Seen by Dr. Sima Matas 6. Skin/Wound Care: Routine skin care checks             --routine drain care 7. Fluids/Electrolytes/Nutrition: Routine ins and outs and follow-up chemistries 8: Intraabdominal abscess s/p perc drain placement- pt knows might go home with drain             --plan Augmentin for 3-4 weeks per ID              -Drain was placed 5/27, exchanged 5/31 by IR             --plan follow-up CT 6/12             --continue monitoring drain output; follow-up IR drain clinic  -Drain  output continues to decrease  -6/19- seen by IR-  9: Colon cancer s/p right hemicolectomy 5/5 with mets to lymph nodes             --follow-up with Dr. Burr Medico; PET scan pending- outpatient? 10: CAD/prior PCI to LAD             --cath on 5/4-stable continue medical management             --continue Plavix and aspirin  -Denies chest pain 11: Recurrent VT/prior ACID: continue mexiletine and amiodorone             --keep K+ > 4.0; Mg > 2.0             --follow-up with Dr. Caryl Comes  -6/15 K+ 4.1 today  -6/20 K+ continues to be >4.0 12: Chronic sys and dia HF: continue [place]; Lasix              --daily weight             --follow-up Dr. Sallyanne Kuster             -Continue lasix '40mg'$  BID 6/11 Noted pitting edema +2 on bilateral lower extremities mostly lower legs above ankle.  Per nursing legs were taut overnight. Increased Lasix from 40 mg to 80 mg BID and added 40 mEq K-Dur, trend K+ with qMonday labs. Currently at 3.6, prior to increasing Lasix dose. -Placed in-pt consult to cardiology for Monday follow up on fluid volume status, changes with Lasix, K-dur -6/14 K+ 3.6 today, stable, continue to follow, edema slightly improved -6/16 K+ stable at 4.1 continue current treatment -6/18 Edema improved, decrease lasix to '40mg'$  BID, labs tomorrow  6/19- electrolytes doing well- Cr/BUN looking good.      Latest Ref Rng & Units 06/02/2022    6:10 AM 05/30/2022    5:15 AM 05/28/2022    5:12 AM  BMP  Glucose 70 - 99 mg/dL 111  100  99   BUN 8 - 23 mg/dL '10  10  8   '$ Creatinine 0.61 - 1.24 mg/dL 0.96  0.72  0.75   Sodium 135 - 145 mmol/L 136  135  136   Potassium 3.5 - 5.1 mmol/L 4.4  4.1  3.6   Chloride 98 - 111 mmol/L 100  99  103   CO2 22 - 32 mmol/L '27  28  28   '$ Calcium 8.9 - 10.3 mg/dL 8.1  7.8  7.7    13: Paroxysmal AF: continue  Eliquis 14: Left brachial DVT: continue Eliquis 15: Hypothyroidism; continue Synthroid 16: Hypertension: takes metoprolol and entresto at home.              -He was restarted on entresto, monitor BP, was discussed with cardiology prior to starting 6/12 BP continues to be overall well controlled, continue to monitor, consider restart BB  6/19- Bp slightly elevated, but has been OK- con't regimen and monitor    06/03/2022    4:33 AM 06/02/2022    7:56 PM 06/02/2022    5:33 AM  Vitals with BMI  Weight 206 lbs 2 oz  199 lbs 8 oz  BMI 47.82  95.62  Systolic 130 865 784  Diastolic 99 81 99  Pulse 76 83 76    17: Seizure disorder: no recent seizure activity; continue Keppra 18: OSA: continue CPAP 19: DM: has been on regular diet without need for insulin coverage; A1c = 5.1             --  d/c SSI 20: Hyperlipidemia: continue Zetia, Lipitor 21. Cellulitis? R forearm- already on Unasyn- should cover- will monitor 6/10 continue to monitor R forearm, already on Unasyn 6/11 Reddened area on R forearm, but skin intact. -improved 22. Anemia             -5/14 anemia stable with HGB 10.6  -5/16 HGB stable at 10.7  -6/20 improved at 11.7 23. Hypoalbuminemia             -Appears to be eating more of his meals, discussed protein intake, continue to follow 24. Anxiety 6/11 Per nursing and pt, he is feeling very anxious today with concerns about medical prognosis. Ordered low dose 0.25 mg Xanax prn for mild anxiety, prn and Neuropsych. evaluation for Monday. 25. R Scapula pain with Rhomboid tenderness  -Therapy completed manual trigger point release  -6/15 xray shoulder ordered- severe OA, Voltaren gel ordered  -improved this AM  -6/20 Order C spine xray for further eval  26. Tinea Cruris , improving  -Nystatin powder- improved 27. Can use LUE for blood draws- made the order for pt- since swelling in LUE resolved.    LOS: 12 days A FACE TO FACE EVALUATION WAS PERFORMED  Jennye Boroughs 06/03/2022, 12:21 PM

## 2022-06-03 NOTE — Progress Notes (Signed)
Occupational Therapy Session Note  Patient Details  Name: Gregory [name] MRN: [id number] Date of Birth: 1961-09-16  Today's Date: 06/03/2022 OT Individual Time: 1135-1200 OT Individual Time Calculation (min): 25 min    Short Term Goals: Week 1:  OT Short Term Goal 1 (Week 1): Pt will donn LB with AE PRN with mod A OT Short Term Goal 1 - Progress (Week 1): Met OT Short Term Goal 2 (Week 1): Pt will tolerate stance position for >2 mins to promote standing endurance needed for self-care tasks OT Short Term Goal 2 - Progress (Week 1): Met OT Short Term Goal 3 (Week 1): Pt will complete short ambulatory transfer to toilet with min A using LRAD OT Short Term Goal 3 - Progress (Week 1): Met Week 2:  OT Short Term Goal 1 (Week 2): STGs = LTGs  Skilled Therapeutic Interventions/Progress Updates:    1:1 Pt reports fatigue all over from earlier's sessions which included stairs and lots of standing. Pt wished to brush teeth at sink; encouraged to perform in standing with setup already done before standing. PT was able to stand for half of the task before sitting to complete the rest. Performed some scapular exercises to promote upright posture and strengthening to help with pain in shoulders. Pt fatigued quickly. Transferred from sink to bed via RW with contact guard. Min A to get LEs into bed. Left resting in bed with nursing helping with meds.   Therapy Documentation Precautions:  Precautions Precautions: Fall Precaution Comments: watch O2 and HR, jp drain Restrictions Weight Bearing Restrictions: No  Pain: Pt requested pain meds from nursing at the end of session - reports right shoulder pain     Therapy/Group: Individual Therapy  Willeen Cass [facility] 06/03/2022, 3:51 PM

## 2022-06-03 NOTE — Progress Notes (Signed)
Patient ID: Gregory [name], male   DOB: 03/29/1961, 61 y.o.   MRN: [id number]  Called [name] and left a message regarding update on his case and if needed more information. Will await return call.

## 2022-06-04 DIAGNOSIS — M25511 Pain in right shoulder: Secondary | ICD-10-CM

## 2022-06-04 DIAGNOSIS — G40909 Epilepsy, unspecified, not intractable, without status epilepticus: Secondary | ICD-10-CM

## 2022-06-04 NOTE — Progress Notes (Signed)
Physical Therapy Session Note  Patient Details  Name: Gregory [name] MRN: [id number] Date of Birth: 07/28/1961  Today's Date: 06/04/2022 PT Individual Time: 0950-1105 PT Individual Time Calculation (min): 75 min   Short Term Goals: Week 2:  PT Short Term Goal 1 (Week 2): =LTGs d/t ELOS  Skilled Therapeutic Interventions/Progress Updates: Pt presented in bed agreeable to therapy. Pt c/o mild back pain but did not rate and no intervention requested during session. Discussed current LOF and pt agreeable to place chair at stair landing to take break while ascending/descending 15 steps. Performed supine to sit with supervision and use of bed features. Performed ambulatory transfer to w/c ~3ft with RW and CGA overall. Pt transported to rehab gym and ambulated ~60ft to stairs with CGA fading to close supervision. After extended seated rest pt performed ascending/descending x 12 steps with CGA. Pt varied between step to and step through in both ascending/and descending. Pt then moved over to parallel bars and performed standing therex as follows: Marching, hip abd/add, and hamstring curls 2 x 10 bilaterally. After seated rest pt ambulated back to room with RW ~177ft with CGA (only due to PTA holding pt's pants up). Pt left in w/c at end of session with call bell within reach and needs met.      Therapy Documentation Precautions:  Precautions Precautions: Fall Precaution Comments: watch O2 and HR, jp drain Restrictions Weight Bearing Restrictions: No General:   Vital Signs:  Pain:   Mobility:   Locomotion :    Trunk/Postural Assessment :    Balance:   Exercises:   Other Treatments:      Therapy/Group: Individual Therapy    06/04/2022, 12:29 PM  

## 2022-06-04 NOTE — Progress Notes (Signed)
PROGRESS NOTE   Subjective/Complaints:  Continues to have pain in his shoulder. His Right elbow flexion and shoulder abduction continues to be weaker on R compared to left.    ROS:  Pt denies SOB, abd pain, CP,  abdominal pain, N/V/C/D, and vision changes,cough  Objective:   DG Cervical Spine Complete  Result Date: 06/03/2022 CLINICAL DATA:  Neck pain EXAM: CERVICAL SPINE - COMPLETE 4 VIEW COMPARISON:  None Available. FINDINGS: There is no evidence of cervical spine fracture or prevertebral soft tissue swelling. Alignment is normal. Moderate multilevel degenerative disc disease, most pronounced at C2-C3, C3-C4 and C6-C7. Moderate multilevel facet arthropathy. No other significant bone abnormalities are identified. Vascular calcifications. IMPRESSION: 1. No acute osseous abnormality. 2. Moderate degenerative changes. Electronically Signed   By: Yetta Glassman M.D.   On: 06/03/2022 14:34   Recent Labs    06/02/22 0610  WBC 8.7  HGB 11.7*  HCT 39.1  PLT 321    Recent Labs    06/02/22 0610  NA 136  K 4.4  CL 100  CO2 27  GLUCOSE 111*  BUN 10  CREATININE 0.96  CALCIUM 8.1*     Intake/Output Summary (Last 24 hours) at 06/04/2022 0803 Last data filed at 06/04/2022 0753 Gross per 24 hour  Intake 960 ml  Output 3445 ml  Net -2485 ml         Physical Exam: Vital Signs Blood pressure (!) 143/98, pulse 76, temperature 97.9 F (36.6 C), resp. rate 17, height '6\' 6"'$  (1.981 m), weight 93.8 kg, SpO2 97 %.  Physical Exam     General: awake, alert, appropriate, laying in bed supine;  NAD HENT: conjugate gaze; oropharynx moist- B/L fatty deposits on lateral aspect of eyes- chronic CV: regular rate; no JVD Pulmonary: CTA B/L; no W/R/R- normal rate GI: soft, NT, ND, (+)BS- JP drain in place Psychiatric: appropriate Neurological: Ox3  Has bandage on R side of abd with JP drain, with  minimal around of purulent  drainage. Extremities: No clubbing, cyanosis. Pulses are 2+. 1+ LE edema Psych: Pt's affect is appropriate. Pt is cooperative Skin: Warm and dry. Small area superficial skin breakdown left sacral area.  Skin tear R buttock, nickel sized. Rt forearm reddend from previous IV.  Fungal rash  b/l groin- improved Neuro: Alert and oriented x3, CN II-XII intact, no focal deficits. Sensory: Intact to LT all 4 extremities Musculoskeletal: Neck supple, cervical back supple. No tenderness. Motor: 5-/5 throughout except LLE 4+/5 and 4-/5 R shoulder tenderness around R medical scapula and posterior shoulder. 4-/5 R biceps flexion    Assessment/Plan: 1. Functional deficits which require 3+ hours per day of interdisciplinary therapy in a comprehensive inpatient rehab setting. Physiatrist is providing close team supervision and 24 hour management of active medical problems listed below. Physiatrist and rehab team continue to assess barriers to discharge/monitor patient progress toward functional and medical goals  Care Tool:  Bathing    Body parts bathed by patient: Right arm, Left arm, Chest, Abdomen, Front perineal area, Buttocks, Right upper leg, Left upper leg, Face, Left lower leg, Right lower leg   Body parts bathed by helper: Buttocks     Bathing assist  Assist Level: Minimal Assistance - Patient > 75%     Upper Body Dressing/Undressing Upper body dressing   What is the patient wearing?: Pull over shirt    Upper body assist Assist Level: Set up assist    Lower Body Dressing/Undressing Lower body dressing      What is the patient wearing?: Pants     Lower body assist Assist for lower body dressing: Contact Guard/Touching assist     Toileting Toileting    Toileting assist Assist for toileting: Supervision/Verbal cueing     Transfers Chair/bed transfer  Transfers assist     Chair/bed transfer assist level: Supervision/Verbal cueing      Locomotion Ambulation   Ambulation assist      Assist level: Contact Guard/Touching assist Assistive device: Walker-rolling Max distance: 230'   Walk 10 feet activity   Assist     Assist level: Contact Guard/Touching assist Assistive device: Walker-rolling   Walk 50 feet activity   Assist Walk 50 feet with 2 turns activity did not occur: Safety/medical concerns  Assist level: Contact Guard/Touching assist Assistive device: Walker-rolling    Walk 150 feet activity   Assist Walk 150 feet activity did not occur: Safety/medical concerns  Assist level: Contact Guard/Touching assist Assistive device: Walker-rolling    Walk 10 feet on uneven surface  activity   Assist Walk 10 feet on uneven surfaces activity did not occur: Safety/medical concerns         Wheelchair     Assist Is the patient using a wheelchair?: Yes Type of Wheelchair: Manual    Wheelchair assist level: Minimal Assistance - Patient > 75% Max wheelchair distance: 100    Wheelchair 50 feet with 2 turns activity    Assist        Assist Level: Minimal Assistance - Patient > 75%   Wheelchair 150 feet activity     Assist      Assist Level: Moderate Assistance - Patient 50 - 74%   Blood pressure (!) 143/98, pulse 76, temperature 97.9 F (36.6 C), resp. rate 17, height '6\' 6"'$  (1.981 m), weight 93.8 kg, SpO2 97 %.  Medical Problem List and Plan: 1. Functional deficits secondary to sepsis, intra-abdominal abscess status post percutaneous drain and recent colon surgery             -patient may  shower if covers JP drain site             -ELOS/Goals: 10-12 days mod I to intermittent supervision             -Continue CIR, continue  PT and OT  -Continue to work on endurance with therapy  -est discharge 6/23, mod I Sup  -Con't CIR- PT and OT  -Team conference today 2.  Antithrombotics: -DVT/anticoagulation:  Pharmaceutical: Other (comment) Eliquis 5 mg twice daily              -antiplatelet therapy: None  -Prior diagnosed LUE DVT- on eliquis, swelling has resolved 3. Pain Management: Tylenol, Percocet as needed  6/19 -still having R shoulder pain intermittently- con't regimen 4. Mood: LCSW to evaluate and provide emotional support             -antipsychotic agents: n/a 5. Neuropsych: This patient is capable of making decisions on his own behalf.  -Seen by Dr. Sima Matas 6. Skin/Wound Care: Routine skin care checks             --routine drain care 7. Fluids/Electrolytes/Nutrition: Routine ins and outs and follow-up chemistries  8: Intraabdominal abscess s/p perc drain placement- pt knows might go home with drain             --plan Augmentin for 3-4 weeks per ID             -Drain was placed 5/27, exchanged 5/31 by IR             --plan follow-up CT 6/12             --continue monitoring drain output; follow-up IR drain clinic  -Drain output continues to decrease  -6/19- seen by IR-  9: Colon cancer s/p right hemicolectomy 5/5 with mets to lymph nodes             --follow-up with Dr. Burr Medico; PET scan pending- outpatient? 10: CAD/prior PCI to LAD             --cath on 5/4-stable continue medical management             --continue Plavix and aspirin  -Denies chest pain 11: Recurrent VT/prior ACID: continue mexiletine and amiodorone             --keep K+ > 4.0; Mg > 2.0             --follow-up with Dr. Caryl Comes  -6/15 K+ 4.1 today  -6/20 K+ continues to be >4.0 12: Chronic sys and dia HF: continue [place]; Lasix              --daily weight             --follow-up Dr. Sallyanne Kuster             -Continue lasix '40mg'$  BID 6/11 Noted pitting edema +2 on bilateral lower extremities mostly lower legs above ankle.  Per nursing legs were taut overnight. Increased Lasix from 40 mg to 80 mg BID and added 40 mEq K-Dur, trend K+ with qMonday labs. Currently at 3.6, prior to increasing Lasix dose. -Placed in-pt consult to cardiology for Monday follow up on fluid volume status, changes  with Lasix, K-dur -6/14 K+ 3.6 today, stable, continue to follow, edema slightly improved -6/16 K+ stable at 4.1 continue current treatment -6/18 Edema improved, decrease lasix to '40mg'$  BID, labs tomorrow  6/19- electrolytes doing well- Cr/BUN looking good.      Latest Ref Rng & Units 06/02/2022    6:10 AM 05/30/2022    5:15 AM 05/28/2022    5:12 AM  BMP  Glucose 70 - 99 mg/dL 111  100  99   BUN 8 - 23 mg/dL '10  10  8   '$ Creatinine 0.61 - 1.24 mg/dL 0.96  0.72  0.75   Sodium 135 - 145 mmol/L 136  135  136   Potassium 3.5 - 5.1 mmol/L 4.4  4.1  3.6   Chloride 98 - 111 mmol/L 100  99  103   CO2 22 - 32 mmol/L '27  28  28   '$ Calcium 8.9 - 10.3 mg/dL 8.1  7.8  7.7    13: Paroxysmal AF: continue Eliquis 14: Left brachial DVT: continue Eliquis 15: Hypothyroidism; continue Synthroid 16: Hypertension: takes metoprolol and entresto at home.              -He was restarted on entresto, monitor BP, was discussed with cardiology prior to starting 6/12 BP continues to be overall well controlled, continue to monitor, consider restart BB  6/21 BP well controlled overall, monitor    06/04/2022    5:19 AM 06/03/2022  10:37 PM 06/03/2022    8:25 PM  Vitals with BMI  Weight 206 lbs 13 oz    BMI 85.2    Systolic 778 242 353  Diastolic 98 84 96  Pulse 76 72 73    17: Seizure disorder: no recent seizure activity; continue Keppra -no recent seizure activty 18: OSA: continue CPAP 19: DM: has been on regular diet without need for insulin coverage; A1c = 5.1             --d/c SSI 20: Hyperlipidemia: continue Zetia, Lipitor 21. Cellulitis? R forearm- already on Unasyn- should cover- will monitor 6/10 continue to monitor R forearm, already on Unasyn 6/11 Reddened area on R forearm, but skin intact. -improved 22. Anemia             -5/14 anemia stable with HGB 10.6  -5/16 HGB stable at 10.7  -6/20 improved at 11.7 23. Hypoalbuminemia             -Appears to be eating more of his meals, discussed  protein intake, continue to follow 24. Anxiety 6/11 Per nursing and pt, he is feeling very anxious today with concerns about medical prognosis. Ordered low dose 0.25 mg Xanax prn for mild anxiety, prn and Neuropsych. evaluation for Monday. 25. R Scapula pain with Rhomboid tenderness,   -Therapy completed manual trigger point release  -6/15 xray shoulder ordered- severe OA, Voltaren gel ordered  -improved this AM  -6/20 Order C spine xray-DDD facet arthropathy  -MRI C spine ordered  26. Tinea Cruris , improving  -Nystatin powder- improved 27. Can use LUE for blood draws- made the order for pt- since swelling in LUE resolved.    LOS: 13 days A FACE TO FACE EVALUATION WAS PERFORMED  Jennye Boroughs 06/04/2022, 8:03 AM

## 2022-06-04 NOTE — Progress Notes (Signed)
Physical Therapy Session Note  Patient Details  Name: Gregory [name] MRN: [id number] Date of Birth: Nov 05, 1961  Today's Date: 06/04/2022 PT Individual Time: 7412-8786 PT Individual Time Calculation (min): 72 min   Short Term Goals: Week 2:  PT Short Term Goal 1 (Week 2): =LTGs d/t ELOS  Skilled Therapeutic Interventions/Progress Updates:     Pt received supine in bed and agrees to therapy. No complaint of pain but does report fatigue and requests to "take it easy on legs today." Supine to sit with bed features and cues for positioning at EOB. PT assists with donning compression hose at EOB. P tperforms sit to stand and stand step transfer to [facility] with RW and cues for sequencing. Pt self propels WC to gym, x150' with bilateral upper extremities and cues for increased shoulder extension for more efficient propulsion technique. Pt performs upper extremity therex while seated upright in WC to strength arms and core. Pt completes 3x10 shoulder presses overhead with 3lb barbell, 3x10 arm raises with elbows extended, 3x10 biceps curls with 4lb bar bell, 3x10 alternating upper extremity wrist curls with 2lb barbell, 3x10 trunk rotations holding onto 2lb barbell with bilateral upper extremities, 3x10 forearm supination/pronation with 3lb hand weight, and 3x10 triceps press ups while seated, with cue to limit use of bilateral lower extremities to focus resistance through arms.. PT provides verbal and tactile cues for optimal performance.  Pt completes x4 6" steps with bilateral hand rails and CGA, with cues for step placement and sequencing. Pt requires seated rest break, saying that L knee started getting "buckly". Following pt completes additional 4 steps.   Pt ambulates x175' with RW and cues for increased proximity to RW for safety, and increased upright gaze to improve posture and balance. Pt performs stand pivot back to bed with cues for positioning. Left supine with alarm intact and all needs  within reach.  Therapy Documentation Precautions:  Precautions Precautions: Fall Precaution Comments: watch O2 and HR, jp drain Restrictions Weight Bearing Restrictions: No   Therapy/Group: Individual Therapy  Breck Coons, PT, DPT 06/04/2022, 5:03 PM

## 2022-06-04 NOTE — Progress Notes (Signed)
Pt refused CPAP for tonight.  

## 2022-06-04 NOTE — Progress Notes (Addendum)
Patient ID: [name], male   DOB: Mar 04, 1961, 61 y.o.   MRN: [id number] Attempted to contact [name]-Workers Comp CM and left another message for her. Pt has multiple questions and encouraged him to reach out to [facility] also he has been working with her for years.  1:41 PM Met with pt to update him regarding tam conference plan still to discharge Friday. Also have spoken with [name]-CM to see how to go about getting rolling walker and HHPT and RN. She reports will need order but [facility] will not be covered due to not having to do with his heart which workers comp covers. Pt's appeal for longer stay from the seven days he was approved for is still pending. [name] will sent a form to this worker to get completed and sent back. Will update pt regarding this information

## 2022-06-04 NOTE — Progress Notes (Signed)
Occupational Therapy Session Note  Patient Details  Name: Gregory [name] MRN: [id number] Date of Birth: August 25, 1961  Today's Date: 06/04/2022 OT Individual Time: 0349-1791 OT Individual Time Calculation (min): 40 min    Short Term Goals: Week 2:  OT Short Term Goal 1 (Week 2): STGs = LTGs  Skilled Therapeutic Interventions/Progress Updates:    OT intervention with focus on sit<>stand, standing balance, functional transers, functional amb with RW, toileting, and self care at sink. Sit<>stand with CGA from lower surfaces. Amb and transfers with supervision using RW. Standing at sink to complete self care tasks at sink with supervision. Sit>supine in bed with supervision. Pt able to reposition in bed without assistance. Pt remained in bed with all needs within reach. Bed alarm activated.   Therapy Documentation Precautions:  Precautions Precautions: Fall Precaution Comments: watch O2 and HR, jp drain Restrictions Weight Bearing Restrictions: No  Pain: Pt reports his Rt shoulder is painful and weak; repositioned  Therapy/Group: Individual Therapy  Leroy Libman 06/04/2022, 12:10 PM

## 2022-06-04 NOTE — Patient Care Conference (Signed)
Inpatient RehabilitationTeam Conference and Plan of Care Update Date: 06/04/2022   Time: 12:16 PM    Patient Name: Gregory [name]      Medical Record Number: [id number]  Date of Birth: 10/12/1961 Sex: Male         Room/Bed: 4U98J/1B14N-82 Payor Info: Payor: GENERIC WORKER'S COMP / Plan: [employer] WORKER[employer] COMP / Product Type: *No Product type* /    Admit Date/Time:  05/22/2022  3:47 PM  Primary Diagnosis:  [facility] Problems: Principal Problem:   Debility Active Problems:   Postprocedural intraabdominal abscess    Expected Discharge Date: Expected Discharge Date: 06/06/22  Team Members Present: Physician leading conference: Dr. Jennye Boroughs Social Worker Present: Ovidio Kin, LCSW Nurse Present: Dorien Chihuahua, RN PT Present: Excell Seltzer, PT OT Present: Jennefer Bravo, OT PPS Coordinator present : Gunnar Fusi, SLP     Current Status/Progress Goal Weekly Team Focus  Bowel/Bladder     continent        Swallow/Nutrition/ Hydration             ADL's   min A bathing, CGA dressing, toileting and toilet transfers  min A self-care, some mod I goals, supervision transfers  d/c planning, HEP   Mobility   CGA to supervision bed mobiliyt, CGA to supervision transfers, CGA to supervision ambulation 100-135f decreased cadence forward flexed posture, ascend/descend x 12 steps without break and B rails. Plans to have chair at landing to take break upon d/c.  mod I transfers, supervision ambulation and stairs  BLE strengthening, endurance, balance,   Communication             Safety/Cognition/ Behavioral Observations            Pain     Right shoulder pain; voltaren gel    Chronic arthritis   Monitor pain level and need for additional med  Skin     JP drain; flush and drain   Able to manage drain   Educate son on drain care    Discharge Planning:  Home with son who works from home, continuing to try to reach Workers Comp-CM-[place]oris without  success-multiple messages left for her   Team Discussion: Patient with JP drain post abd abscess.  Edema is better and shoulder pain is better.  Patient on target to meet rehab goals: yes, currently needs CGA for transfers and able to ambulate up to 150'. Goals for discharge set for mod I assist.  *See Care Plan and progress notes for long and short-term goals.   Revisions to Treatment Plan:  EMG OP of shoulder  Downgraded goals for OT due to pain  Teaching Needs: Safety, medications, drain/skin care, etc  Current Barriers to Discharge: Decreased caregiver support  Possible Resolutions to Barriers: Family education HH follow up services     Medical Summary Current Status: Shoulder pain, ANemia, HTN, DVT,edema, heart failure, abdominal drain  Barriers to Discharge: Medical stability;Home enviroment access/layout  Barriers to Discharge Comments: Shoulder pain, ANemia, HTN, DVT,edema, heart failure, abdominal drain Possible Resolutions to [employer]: follow labs CBC and BMP, Imaging c spine, monitor drain output   Continued Need for Acute Rehabilitation Level of Care: The patient requires daily medical management by a physician with specialized training in physical medicine and rehabilitation for the following reasons: Direction of a multidisciplinary physical rehabilitation program to maximize functional independence : Yes Medical management of patient stability for increased activity during participation in an intensive rehabilitation regime.: Yes Analysis of laboratory values and/or radiology reports  with any subsequent need for medication adjustment and/or medical intervention. : Yes   I attest that I was present, lead the team conference, and concur with the assessment and plan of the team.   Dorien Chihuahua B 06/04/2022, 3:29 PM

## 2022-06-05 ENCOUNTER — Encounter: Payer: Commercial Managed Care - HMO | Admitting: Student

## 2022-06-05 ENCOUNTER — Inpatient Hospital Stay (HOSPITAL_COMMUNITY): Payer: PRIVATE HEALTH INSURANCE

## 2022-06-05 ENCOUNTER — Other Ambulatory Visit (HOSPITAL_COMMUNITY): Payer: Self-pay

## 2022-06-05 DIAGNOSIS — I255 Ischemic cardiomyopathy: Secondary | ICD-10-CM

## 2022-06-05 DIAGNOSIS — Z95810 Presence of automatic (implantable) cardiac defibrillator: Secondary | ICD-10-CM

## 2022-06-05 DIAGNOSIS — I5042 Chronic combined systolic (congestive) and diastolic (congestive) heart failure: Secondary | ICD-10-CM

## 2022-06-05 DIAGNOSIS — I472 Ventricular tachycardia, unspecified: Secondary | ICD-10-CM

## 2022-06-05 DIAGNOSIS — I480 Paroxysmal atrial fibrillation: Secondary | ICD-10-CM

## 2022-06-05 MED ORDER — POTASSIUM CHLORIDE CRYS ER 10 MEQ PO TBCR
10.0000 meq | EXTENDED_RELEASE_TABLET | Freq: Every day | ORAL | Status: DC
  Administered 2022-06-06: 10 meq via ORAL
  Filled 2022-06-05: qty 1

## 2022-06-05 MED ORDER — METOPROLOL SUCCINATE ER 25 MG PO TB24
25.0000 mg | ORAL_TABLET | Freq: Two times a day (BID) | ORAL | Status: DC
  Administered 2022-06-05 – 2022-06-06 (×2): 25 mg via ORAL
  Filled 2022-06-05 (×2): qty 1

## 2022-06-05 MED ORDER — FUROSEMIDE 40 MG PO TABS
40.0000 mg | ORAL_TABLET | Freq: Every day | ORAL | Status: DC
  Administered 2022-06-06: 40 mg via ORAL
  Filled 2022-06-05: qty 1

## 2022-06-05 MED ORDER — SODIUM CHLORIDE FLUSH 0.9 % IV SOLN
5.0000 mL | Freq: Every day | INTRAVENOUS | 0 refills | Status: AC
  Filled 2022-06-05: qty 100, 20d supply, fill #0

## 2022-06-05 NOTE — Progress Notes (Cosign Needed)
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 Dwaine Gale, repositioned on 5/31  Subjective:  Limited eval. Seen in PT, planning to go home tomorrow RN confirms teaching of flushing and drain management  Allergies: Patient has no known allergies.  Medications: Prior to Admission medications   Medication Sig Start Date End Date Taking? Authorizing Provider  acetaminophen (TYLENOL) 500 MG tablet Take 1,000 mg by mouth every 6 (six) hours as needed for moderate pain.    [provider]  amiodarone (PACERONE) 400 MG tablet Take 1 tablet (400 mg total) by mouth 2 (two) times daily. 05/22/22   Patrecia Pour, MD  amoxicillin-clavulanate (AUGMENTIN) 875-125 MG tablet Take 1 tablet by mouth every 12 (twelve) hours. 05/22/22   Patrecia Pour, MD  apixaban (ELIQUIS) 5 MG TABS tablet TAKE 1 TABLET(5 MG) BY MOUTH TWICE DAILY Patient taking differently: Take 5 mg by mouth 2 (two) times daily. 04/11/22   Croitoru, [name], MD  atorvastatin (LIPITOR) 80 MG tablet TAKE ONE TABLET BY MOUTH EVERY EVENING Patient taking differently: Take 80 mg by mouth every evening. 05/05/22   Croitoru, [name], MD  clopidogrel (PLAVIX) 75 MG tablet TAKE 1 TABLET(75 MG) BY MOUTH DAILY Patient taking differently: Take 75 mg by mouth daily. TAKE 1 TABLET(75 MG) BY MOUTH DAILY 10/02/21   Croitoru, Dani Gobble, MD  cyclobenzaprine (FEXMID) 7.5 MG tablet Take 1 tablet (7.5 mg total) by mouth 3 (three) times daily as needed for muscle spasms. 05/22/22   Patrecia Pour, MD  dapagliflozin propanediol (FARXIGA) 10 MG TABS tablet Take 1 tablet (10 mg total) by mouth daily. 05/08/22   Croitoru, [name], MD  docusate sodium (COLACE) 100 MG capsule Take 1 capsule (100 mg total) by mouth 2 (two) times daily. 05/22/22   Patrecia Pour, MD  ezetimibe (ZETIA) 10 MG tablet Take 1 tablet  (10 mg total) by mouth daily. 03/13/22   Croitoru, [name], MD  furosemide (LASIX) 40 MG tablet Take 1 tablet (40 mg total) by mouth daily. 05/22/22   Patrecia Pour, MD  levETIRAcetam (KEPPRA) 500 MG tablet TAKE 1 TABLET(500 MG) BY MOUTH TWICE DAILY Patient taking differently: Take 500 mg by mouth 2 (two) times daily. 04/28/22   [name], MD  metoprolol succinate (TOPROL-XL) 25 MG 24 hr tablet Take one tablet, 25 mg, in the morning and 50 mg, two tablets, in the evening. Patient taking differently: 25 mg. Per patient taking 25 mg in the morning and 25 mg at night 06/27/21   Croitoru, [name], MD  mexiletine (MEXITIL) 150 MG capsule Take 1 capsule (150 mg total) by mouth every 8 (eight) hours. 05/22/22   Patrecia Pour, MD  oxyCODONE (OXY IR/ROXICODONE) 5 MG immediate release tablet Take 1-2 tablets (5-10 mg total) by mouth every 8 (eight) hours as needed for breakthrough pain. 05/05/22   [name], MD  pantoprazole (PROTONIX) 40 MG tablet TAKE 1 TABLET(40 MG) BY MOUTH DAILY Patient taking differently: Take 40 mg by mouth daily. TAKE 1 TABLET(40 MG) BY MOUTH DAILY 03/13/22   Croitoru, [name], MD  polyethylene glycol (MIRALAX / GLYCOLAX) 17 g packet Take 17 g by mouth 2 (two) times daily. 05/22/22   Patrecia Pour, MD  potassium chloride (KLOR-CON M) 10 MEQ tablet Take 1 tablet (10 mEq total) by mouth daily. 12/02/21   Croitoru, [place],  MD  sacubitril-valsartan (ENTRESTO) 49-51 MG Take 1 tablet by mouth 2 (two) times daily. 10/07/21   Croitoru, [name], MD  sildenafil (VIAGRA) 50 MG tablet Take 1 tablet (50 mg total) by mouth as needed for erectile dysfunction. 01/25/21   Shirley Friar, PA-C     Vital Signs: BP (!) 138/99 (BP Location: Right Arm)   Pulse 75   Temp 97.6 F (36.4 C) (Oral)   Resp 20   Ht '6\' 6"'$  (1.981 m)   Wt 199 lb 15.3 oz (90.7 kg)   SpO2 98%   BMI 23.11 kg/m   RUQ Percutaneous fluid collection drain placed 5/31 Seen in follow-up today Drain Location: RUQ Size: Fr size: 14  Fr Date of placement: 5/27; exchanged 5/31  Currently to: Drain collection device: suction bulb 24 hour output:  Output by Drain (mL) 06/03/22 0700 - 06/03/22 1459 06/03/22 1500 - 06/03/22 2259 06/03/22 2300 - 06/04/22 0659 06/04/22 0700 - 06/04/22 1459 06/04/22 1500 - 06/04/22 2259 06/04/22 2300 - 06/05/22 0659 06/05/22 0700 - 06/05/22 1410  Closed System Drain Right RLQ  14 Fr. 20    55       Imaging: DG Cervical Spine Complete  Result Date: 06/03/2022 CLINICAL DATA:  Neck pain EXAM: CERVICAL SPINE - COMPLETE 4 VIEW COMPARISON:  None Available. FINDINGS: There is no evidence of cervical spine fracture or prevertebral soft tissue swelling. Alignment is normal. Moderate multilevel degenerative disc disease, most pronounced at C2-C3, C3-C4 and C6-C7. Moderate multilevel facet arthropathy. No other significant bone abnormalities are identified. Vascular calcifications. IMPRESSION: 1. No acute osseous abnormality. 2. Moderate degenerative changes. Electronically Signed   By: Yetta Glassman M.D.   On: 06/03/2022 14:34    Labs:  CBC: Recent Labs    05/26/22 0558 05/28/22 0512 05/30/22 0515 06/02/22 0610  WBC 2.9* 3.2* 4.8 8.7  HGB 10.7* 10.6* 10.7* 11.7*  HCT 35.9* 35.7* 35.9* 39.1  PLT 280 306 308 321    COAGS: Recent Labs    05/08/22 1637 05/09/22 1325 05/15/22 2250 05/16/22 0619 05/16/22 2053 05/17/22 0708  INR 1.7* 1.6*  --   --   --   --   APTT  --   --  64* 55* 69* 65*    BMP: Recent Labs    05/26/22 0558 05/28/22 0512 05/30/22 0515 06/02/22 0610  NA 136 136 135 136  K 3.7 3.6 4.1 4.4  CL 104 103 99 100  CO2 '26 28 28 27  '$ GLUCOSE 100* 99 100* 111*  BUN '9 8 10 10  '$ CALCIUM 7.2* 7.7* 7.8* 8.1*  CREATININE 0.84 0.75 0.72 0.96  GFRNONAA >60 >60 >60 >60    LIVER FUNCTION TESTS: Recent Labs    05/13/22 0705 05/14/22 0206 05/15/22 0229 05/23/22 0608  BILITOT 0.1* 0.2* 0.2* 0.2*  AST 70* 69* 50* 22  ALT 56* 55* 47* 24  ALKPHOS 174* 164* 138* 122  PROT  5.5* 5.6* 5.4* 6.0*  ALBUMIN <1.5* <1.5* <1.5* 1.7*    Assessment and Plan:  Intra-abdominal abscess --drain functioning well Continue TID flushes with 5 cc NS. Record output Q shift. Dressing changes QD or PRN if soiled.   Discharge planning: Typically patient will follow up with IR clinic 10-14 days post d/c for repeat imaging/possible drain injection. IR scheduler will contact patient with date/time of appointment. Patient will need to flush drain QD with 5 cc NS, record output QD, dressing changes every 2-3 days or earlier if soiled.  Will place IR outpatient drain order.  IR will continue to follow - please call with questions or concerns.  Electronically Signed: Pasty Spillers, PA 06/05/2022, 2:13 PM   I spent a total of 15 Minutes at the the patient's bedside AND on the patient's hospital floor or unit, greater than 50% of which was counseling/coordinating care for drain management

## 2022-06-05 NOTE — Progress Notes (Signed)
Occupational Therapy Session Note  Patient Details  Name: Gregory [name] MRN: [id number] Date of Birth: [birth year]  Today's Date: 06/05/2022 OT Individual Time: 1000-1058 OT Individual Time Calculation (min): 58 min    Short Term Goals: Week 2:  OT Short Term Goal 1 (Week 2): STGs = LTGs  Skilled Therapeutic Interventions/Progress Updates:    Pt resting in bed upon arrival. Pt requested to use bathroom before taking a shower this morning. Supine>sit EOB with supervision using bed functions. Amb with RW to bathroom with supervision. Bathing at shower level and dressing with sit<>stand from EOB per assist level as reported below. Sit<>stand with supervision and standing balance without assistance. Pt requrested to return to bed at end of session. Sit>supine with supervision. Pt able to repositoin in bed without assistance.   Therapy Documentation Precautions:  Precautions Precautions: Fall Precaution Comments: watch O2 and HR, jp drain Restrictions Weight Bearing Restrictions: No  Pain:  Pt reports 4/10 BLE pain; rest/repostioined ADL: ADL Eating: Independent Where Assessed-Eating: Bed level Grooming: Modified independent Where Assessed-Grooming: Sitting at sink Upper Body Bathing: Supervision/safety Where Assessed-Upper Body Bathing: Shower Lower Body Bathing: Contact guard Where Assessed-Lower Body Bathing: Shower Upper Body Dressing: Supervision/safety Where Assessed-Upper Body Dressing: Edge of bed Lower Body Dressing: Minimal assistance Where Assessed-Lower Body Dressing: Edge of bed Toileting: Supervision/safety Where Assessed-Toileting: Toilet, Recruitment consultant Transfer: Close supervision Toilet Transfer Method: Counselling psychologist: Raised toilet seat, Grab bars Tub/Shower Transfer: Unable to assess Tub/Shower Transfer Method: Unable to assess Social research officer, government: Close supervision Social research officer, government Method:  [employer]: Grab bars, Transfer tub bench   Therapy/Group: Individual Therapy  Leroy Libman 06/05/2022, 11:01 AM

## 2022-06-05 NOTE — Progress Notes (Signed)
Physical Therapy Discharge Summary  Patient Details  Name: Gregory [name] MRN: [id number] Date of Birth: 1961/02/15  Today's Date: 06/05/2022   Patient has met 8 of 9 long term goals due to improved activity tolerance, improved balance, increased strength, and improved coordination.  Patient to discharge at an ambulatory level Supervision.   Patient's care partner is independent to provide the necessary physical assistance at discharge.  Reasons goals not met: Pt with increased R shoulder pain that continues to be assessed limiting pt's tolerance to w/c mobility. Therefore therapies focused on building endurance via ambulation vs w/c. Pt will not d/c home with w/c.   Recommendation:  Patient will benefit from ongoing skilled PT services in home health setting to continue to advance safe functional mobility, address ongoing impairments in strength, balance, ambulation, endurance, and minimize fall risk.  Equipment: Agricultural consultant  Reasons for discharge: treatment goals met and discharge from hospital  Patient/family agrees with progress made and goals achieved: Yes  PT Discharge Precautions/Restrictions Restrictions Weight Bearing Restrictions: No Pain Interference Pain Interference Pain Effect on Sleep: 3. Frequently Pain Interference with Therapy Activities: 2. Occasionally Pain Interference with Day-to-Day Activities: 1. Rarely or not at all Vision/Perception  Vision - History Ability to See in Adequate Light: 0 Adequate Perception Perception: Within Functional Limits Praxis Praxis: Intact  Cognition Overall Cognitive Status: Within Functional Limits for tasks assessed Arousal/Alertness: Awake/alert Memory: Appears intact Awareness: Appears intact Problem Solving: Appears intact Safety/Judgment: Appears intact Sensation Sensation Light Touch: Appears Intact Hot/Cold: Appears Intact Proprioception: Appears Intact Stereognosis: Not  tested Coordination Gross Motor Movements are Fluid and Coordinated: Yes Fine Motor Movements are Fluid and Coordinated: No Coordination and Movement Description: generalized weakness 2/2 debility and deconditioning Finger Nose Finger Test: Dysmetria bilaterally, limited on LUE 2/2 pain Motor  Motor Motor: Within Functional Limits Motor - Skilled Clinical Observations: generalized deconditioning.  Mobility Bed Mobility Bed Mobility: Supine to Sit;Sit to Supine Supine to Sit: Independent with assistive device Sitting - Scoot to Edge of Bed: Independent Sit to Supine: Independent with assistive device Transfers Transfers: Sit to Stand;Stand Pivot Transfers Sit to Stand: Independent with assistive device Transfer (Assistive device): Rolling walker Locomotion  Gait Ambulation: Yes Gait Assistance: Supervision/Verbal cueing Gait Distance (Feet): 150 Feet Assistive device: Rolling walker Gait Gait: Yes Gait Pattern: Impaired Gait Pattern: Trunk flexed Gait velocity: decreased Stairs / Additional Locomotion Stairs: Yes Stairs Assistance: Supervision/Verbal cueing Stair Management Technique: Two rails Number of Stairs: 16 Height of Stairs: 6 Wheelchair Mobility Wheelchair Mobility: Yes Wheelchair Assistance: Doctor, general practice: Both upper extremities Wheelchair Parts Management: Needs assistance Distance: 117ft  Trunk/Postural Assessment  Cervical Assessment Cervical Assessment: Within Functional Limits Thoracic Assessment Thoracic Assessment: Within Functional Limits Lumbar Assessment Lumbar Assessment: Exceptions to [facility] (posterior pelvic tilt) Postural Control Postural Control: Within Functional Limits  Balance Balance Balance Assessed: Yes Standardized Balance Assessment Standardized Balance Assessment: Berg Balance Test Berg Balance Test Sit to Stand: Able to stand  independently using hands Standing Unsupported: Able to stand 30  seconds unsupported Sitting with Back Unsupported but Feet Supported on Floor or Stool: Able to sit safely and securely 2 minutes Stand to Sit: Controls descent by using hands Transfers: Able to transfer safely, definite need of hands Standing Unsupported with Eyes Closed: Able to stand 10 seconds with supervision Standing Ubsupported with Feet Together: Able to place feet together independently but unable to hold for 30 seconds From Standing, Reach Forward with Outstretched Arm: Can reach forward >5 cm safely (2") From  Standing Position, Pick up Object from Floor: Able to pick up shoe, needs supervision From Standing Position, Turn to Look Behind Over each Shoulder: Looks behind one side only/other side shows less weight shift Turn 360 Degrees: Able to turn 360 degrees safely but slowly Standing Unsupported, Alternately Place Feet on Step/Stool: Able to complete >2 steps/needs minimal assist Standing Unsupported, One Foot in Front: Able to take small step independently and hold 30 seconds Standing on One Leg: Unable to try or needs assist to prevent fall Total Score: 33 Static Sitting Balance Static Sitting - Balance Support: Feet supported Static Sitting - Level of Assistance: 6: Modified independent (Device/Increase time) Dynamic Sitting Balance Dynamic Sitting - Balance Support: During functional activity Dynamic Sitting - Level of Assistance: 6: Modified independent (Device/Increase time) Static Standing Balance Static Standing - Balance Support: Bilateral upper extremity supported Static Standing - Level of Assistance: 6: Modified independent (Device/Increase time) Dynamic Standing Balance Dynamic Standing - Balance Support: During functional activity;Bilateral upper extremity supported Dynamic Standing - Level of Assistance: 5: Stand by assistance Extremity Assessment  RUE Assessment RUE Assessment: Exceptions to [facility] Active Range of Motion (AROM) Comments: ~110 at shoulder, all  other [facility] General Strength Comments: 4/5 LUE Assessment LUE Assessment: Exceptions to [facility] Active Range of Motion (AROM) Comments: Shoulder flexion <90, General Strength Comments: 4/5 overall RLE Assessment RLE Assessment: Within Functional Limits LLE Assessment LLE Assessment: Exceptions to [facility] General Strength Comments: grossly 4/5   Barbie Banner, PT, DPT Rosita DeChalus 06/05/2022, 2:13 PM

## 2022-06-05 NOTE — Progress Notes (Incomplete)
Inpatient Rehabilitation Discharge Medication Review by a Pharmacist  A complete drug regimen review was completed for this patient to identify any potential clinically significant medication issues.  High Risk Drug Classes Is patient taking? Indication by Medication  Antipsychotic Yes   Anticoagulant Yes Apixaban: afib and DVT treatment  Antibiotic Yes Augmentin: abdominal infection  Opioid Yes Oxycodone: PRN pain  Antiplatelet Yes Clopidogrel: CAD stents  Hypoglycemics/insulin Yes Dapagliflozin: CHF  Vasoactive Medication Yes Amiodarone, mexiletine: afib/ recurrent vtach  Furosemide, metoprolol, Entresto: CHF, HTN  Chemotherapy No   Other Yes Atorvastatin, ezetimibe: HLD Cyclobenzaprine: PRN muscle spasms Levetiracetam: seizure ppx Levothyroxine: hypothyroid tx Methocarbamol: PRN muscle spasms Pantoprazole: GERD ppx Kcl 10: supplementation Viagra- ED     Type of Medication Issue Identified Description of Issue Recommendation(s)  Drug Interaction(s) (clinically significant)     Duplicate Therapy     Allergy     No Medication Administration End Date     Incorrect Dose     Additional Drug Therapy Needed     Significant med changes from prior encounter (inform family/care partners about these prior to discharge).    Other       Clinically significant medication issues were identified that warrant physician communication and completion of prescribed/recommended actions by midnight of the next day:  No   Time spent performing this drug regimen review (minutes): 30   Thank you for allowing pharmacy to be a part of this patient's care.  Vaughan Basta BS, PharmD, BCPS Clinical Pharmacist 06/05/2022 9:22 AM  Contact: [phone] after 3 PM  "Be curious, not judgmental..." -Jamal Maes

## 2022-06-05 NOTE — Progress Notes (Signed)
Physical Therapy Session Note  Patient Details  Name: Gregory [name] MRN: [id number] Date of Birth: April 13, 1961  Today's Date: 06/05/2022 PT Individual Time: 0805-0900 and 1305-1420 PT Individual Time Calculation (min): 55 min and 75  Short Term Goals: Week 2:  PT Short Term Goal 1 (Week 2): =LTGs d/t ELOS  Skilled Therapeutic Interventions/Progress Updates: Pt presented in bed agreeable to therapy. Pt c/o LBP but did not rate and rest breaks provided as needed during session. Pt performed bed mobility mod I with bed flat and use of bed rail to simulate home environment. Performed Sit to stand mod I and performed ambulatory transfer to w/c with CGA (could be supervision however PTA holding pt's pants due to significantly loose fitting pants). Pt transported to car simulator for time management and participated in car transfer with supervision. Pt then transported to rehab gym and participated in ascending/descending 2 x 8 steps with supervision to simulate home environment. Pt also participated in ambulation on compliant surface with CGA. Pt then transported to day room and participated in corn hole using LUE in standing. After seated rest pt was able to ambulate and pick up bean bags with reacher with grossly supervision but PTA intermittently supporting pt's pants due to loose fitting clothing. Pt returned to w/c and transported back to room at end of session and pt requesting to return to bed. Pt performed sit to supine mod I with use of bed rail. Pt left in bed at end of session with bed alarm on, call bell within reach and needs met.   Tx2: Pt presented in bed agreeable to therapy. Pt c/o mild back pain 3-4/10 per pt but no intervention requested. Pt performed bed mobility mod I and transferred to w/c mod I after PTA was able to adjust pants. Pt propelled w/c to day room ~177f with supervision and increased time. Pt participated in x 2 additional rounds of cornhole in standing with one  round with AD support and second round with RW nearby but encouraged to not use. Pt was able to perform with CGA and mild lateral sway but no LOB. Pt then transferred to mat and participated in [place] with improved score of 33/56 from 15/56. Patient demonstrates increased fall risk as noted by score of  33 /56 on Berg Balance Scale.  (<36= high risk for falls, close to 100%; 37-45 significant >80%; 46-51 moderate >50%; 52-55 lower >25%). Pt did require signficant increased time to perform test due to fatigue but was able to complete. Pt transported back to room at end of session and performed ambulatory transfer to bed mod I with RW. Pt returned to supine mod I and left with bed alarm on, call bell within reach and needs met.       Therapy Documentation Precautions:  Precautions Precautions: Fall Precaution Comments: watch O2 and HR, jp drain Restrictions Weight Bearing Restrictions: No General:   Vital Signs: Therapy Vitals Temp: 97.7 F (36.5 C) Temp Source: Oral Pulse Rate: 85 Resp: 16 BP: (!) 143/103 Patient Position (if appropriate): Lying Oxygen Therapy SpO2: 99 % O2 Device: Room Air Pain: Pain Assessment Pain Scale: 0-10 Pain Score: 7  Pain Type: Acute pain Pain Location: Back Pain Orientation: Mid;Lower Pain Descriptors / Indicators: Aching Pain Frequency: Intermittent Pain Onset: On-going Patients Stated Pain Goal: 4 Pain Intervention(s): Medication (See eMAR) Mobility: Bed Mobility Bed Mobility: Supine to Sit;Sit to Supine Supine to Sit: Independent with assistive device Sitting - Scoot to Edge of Bed: Independent  Sit to Supine: Independent with assistive device Transfers Transfers: Sit to Stand;Stand Pivot Transfers Sit to Stand: Independent with assistive device Transfer (Assistive device): Rolling walker Locomotion : Gait Ambulation: Yes Gait Assistance: Supervision/Verbal cueing Gait Distance (Feet): 150 Feet Assistive device: Rolling  walker Gait Gait: Yes Gait Pattern: Impaired Gait Pattern: Trunk flexed Gait velocity: decreased Stairs / Additional Locomotion Stairs: Yes Stairs Assistance: Supervision/Verbal cueing Stair Management Technique: Two rails Number of Stairs: 16 Height of Stairs: 6 Wheelchair Mobility Wheelchair Mobility: Yes Wheelchair Assistance: Chartered loss adjuster: Both upper extremities Wheelchair Parts Management: Needs assistance Distance: 188f  Trunk/Postural Assessment : Cervical Assessment Cervical Assessment: Within Functional Limits Thoracic Assessment Thoracic Assessment: Within Functional Limits Lumbar Assessment Lumbar Assessment: Exceptions to [facility](posterior pelvic tilt) Postural Control Postural Control: Within Functional Limits  Balance: Balance Balance Assessed: Yes Standardized Balance Assessment Standardized Balance Assessment: Berg Balance Test Berg Balance Test Sit to Stand: Able to stand  independently using hands Standing Unsupported: Able to stand 30 seconds unsupported Sitting with Back Unsupported but Feet Supported on Floor or Stool: Able to sit safely and securely 2 minutes Stand to Sit: Controls descent by using hands Transfers: Able to transfer safely, definite need of hands Standing Unsupported with Eyes Closed: Able to stand 10 seconds with supervision Standing Ubsupported with Feet Together: Able to place feet together independently but unable to hold for 30 seconds From Standing, Reach Forward with Outstretched Arm: Can reach forward >5 cm safely (2") From Standing Position, Pick up Object from Floor: Able to pick up shoe, needs supervision From Standing Position, Turn to Look Behind Over each Shoulder: Looks behind one side only/other side shows less weight shift Turn 360 Degrees: Able to turn 360 degrees safely but slowly Standing Unsupported, Alternately Place Feet on Step/Stool: Able to complete >2 steps/needs minimal  assist Standing Unsupported, One Foot in Front: Able to take small step independently and hold 30 seconds Standing on One Leg: Unable to try or needs assist to prevent fall Total Score: 33 Static Sitting Balance Static Sitting - Balance Support: Feet supported Static Sitting - Level of Assistance: 6: Modified independent (Device/Increase time) Dynamic Sitting Balance Dynamic Sitting - Balance Support: During functional activity Dynamic Sitting - Level of Assistance: 6: Modified independent (Device/Increase time) Static Standing Balance Static Standing - Balance Support: Bilateral upper extremity supported Static Standing - Level of Assistance: 6: Modified independent (Device/Increase time) Dynamic Standing Balance Dynamic Standing - Balance Support: During functional activity;Bilateral upper extremity supported Dynamic Standing - Level of Assistance: 5: Stand by assistance Exercises:   Other Treatments:      Therapy/Group: Individual Therapy  Lamyia Cdebaca 06/05/2022, 3:55 PM

## 2022-06-05 NOTE — Plan of Care (Signed)
Patient unable to flush and drain JP; son education completed and son will manage the JP drain.

## 2022-06-05 NOTE — Progress Notes (Signed)
Occupational Therapy Discharge Summary  Patient Details  Name: Gregory [name] MRN: [id number] Date of Birth: [birth year]  Patient has met 9 of 9 long term goals due to improved activity tolerance, improved balance, postural control, ability to compensate for deficits, and improved coordination.  Pt made steady progress with BADLs and functional tranfsers during this admission. Pt requires min A for bahting tasks and LB dressing tasks. Standing balance with mod I. Functional transfers with supervision. Pt discharging home with son who has participated in therapy sessions. Patient to discharge at overall Modified Independent level.  Patient's care partner is independent to provide the necessary physical assistance at discharge.    Reasons goals not met: n/a  Recommendation:  Patient will benefit from ongoing skilled OT services in home health setting to continue to advance functional skills in the area of BADL and Reduce care partner burden.  Equipment: No equipment provided  Reasons for discharge: treatment goals met and discharge from hospital  Patient/family agrees with progress made and goals achieved: Yes  OT Discharge ADL ADL Eating: Independent Where Assessed-Eating: Bed level Grooming: Modified independent Where Assessed-Grooming: Sitting at sink Upper Body Bathing: Supervision/safety Where Assessed-Upper Body Bathing: Shower Lower Body Bathing: Contact guard Where Assessed-Lower Body Bathing: Shower Upper Body Dressing: Supervision/safety Where Assessed-Upper Body Dressing: Edge of bed Lower Body Dressing: Minimal assistance Where Assessed-Lower Body Dressing: Edge of bed Toileting: Supervision/safety Where Assessed-Toileting: Toilet, Recruitment consultant Transfer: Close supervision Toilet Transfer Method: Counselling psychologist: Raised toilet seat, Grab bars Tub/Shower Transfer: Unable to assess Tub/Shower Transfer Method: Unable to  assess Social research officer, government: Close supervision Social research officer, government Method: Heritage manager: Grab bars, Gaffer Baseline Vision/History: 1 Wears glasses Patient Visual Report: No change from baseline Vision Assessment?: No apparent visual deficits Perception  Perception: Within Functional Limits Praxis Praxis: Intact Cognition Cognition Overall Cognitive Status: Within Functional Limits for tasks assessed Arousal/Alertness: Awake/alert Orientation Level: Person;Place;Situation Person: Oriented Place: Oriented Situation: Oriented Memory: Appears intact Awareness: Appears intact Problem Solving: Appears intact Safety/Judgment: Appears intact Brief Interview for Mental Status (BIMS) Repetition of Three Words (First Attempt): 3 Temporal Orientation: Year: Correct Temporal Orientation: Month: Accurate within 5 days Temporal Orientation: Day: Correct Recall: "Sock": Yes, no cue required Recall: "Blue": Yes, no cue required Recall: "Bed": Yes, no cue required BIMS Summary Score: 15 Sensation Sensation Light Touch: Appears Intact Hot/Cold: Appears Intact Proprioception: Appears Intact Stereognosis: Not tested Coordination Gross Motor Movements are Fluid and Coordinated: Yes Fine Motor Movements are Fluid and Coordinated: No Coordination and Movement Description: generalized weakness 2/2 debility and deconditioning Finger Nose Finger Test: Dysmetria bilaterally, limited on LUE 2/2 pain Motor  Motor Motor: Within Functional Limits Motor - Skilled Clinical Observations: generalized deconditioning. Trunk/Postural Assessment  Cervical Assessment Cervical Assessment: Within Functional Limits Thoracic Assessment Thoracic Assessment: Within Functional Limits Lumbar Assessment Lumbar Assessment: Exceptions to [facility] (posterior pelvic tilt) Postural Control Postural Control: Within Functional Limits  Balance Static Sitting  Balance Static Sitting - Balance Support: Feet supported Static Sitting - Level of Assistance: 6: Modified independent (Device/Increase time) Dynamic Sitting Balance Dynamic Sitting - Balance Support: During functional activity Dynamic Sitting - Level of Assistance: 6: Modified independent (Device/Increase time) Extremity/Trunk Assessment RUE Assessment RUE Assessment: Exceptions to [facility] Active Range of Motion (AROM) Comments: ~110 at shoulder, all other [facility] General Strength Comments: 4/5 LUE Assessment LUE Assessment: Exceptions to [facility] Active Range of Motion (AROM) Comments: Shoulder flexion <90, General Strength Comments: 4/5 overall   Leotis Shames  Chappell 06/05/2022, 11:00 AM

## 2022-06-05 NOTE — Procedures (Signed)
Patient refuses CPAP 

## 2022-06-06 ENCOUNTER — Other Ambulatory Visit: Payer: Self-pay | Admitting: Physician Assistant

## 2022-06-06 DIAGNOSIS — M5412 Radiculopathy, cervical region: Secondary | ICD-10-CM

## 2022-06-06 DIAGNOSIS — Z8679 Personal history of other diseases of the circulatory system: Secondary | ICD-10-CM

## 2022-06-06 LAB — BASIC METABOLIC PANEL
Anion gap: 6 (ref 5–15)
BUN: 11 mg/dL (ref 8–23)
CO2: 27 mmol/L (ref 22–32)
Calcium: 8.2 mg/dL — ABNORMAL LOW (ref 8.9–10.3)
Chloride: 101 mmol/L (ref 98–111)
Creatinine, Ser: 0.77 mg/dL (ref 0.61–1.24)
GFR, Estimated: >60 mL/min (ref >60)
Glucose, Bld: 125 mg/dL — ABNORMAL HIGH (ref 70–99)
Potassium: 4.4 mmol/L (ref 3.5–5.1)
Sodium: 134 mmol/L — ABNORMAL LOW (ref 135–145)

## 2022-06-06 LAB — CBC
HCT: 37 % — ABNORMAL LOW (ref 39.0–52.0)
Hemoglobin: 10.9 g/dL — ABNORMAL LOW (ref 13.0–17.0)
MCH: 26.1 pg (ref 26.0–34.0)
MCHC: 29.5 g/dL — ABNORMAL LOW (ref 30.0–36.0)
MCV: 88.7 fL (ref 80.0–100.0)
Platelets: 313 10*3/uL (ref 150–400)
RBC: 4.17 MIL/uL — ABNORMAL LOW (ref 4.22–5.81)
RDW: 23.3 % — ABNORMAL HIGH (ref 11.5–15.5)
WBC: 9.6 10*3/uL (ref 4.0–10.5)
nRBC: 0 % (ref 0.0–0.2)

## 2022-06-06 MED ORDER — EZETIMIBE 10 MG PO TABS: 10.0000 mg | ORAL_TABLET | Freq: Every day | ORAL | 0 refills | Status: DC

## 2022-06-06 MED ORDER — DOCUSATE SODIUM 100 MG PO CAPS: 100.0000 mg | ORAL_CAPSULE | Freq: Two times a day (BID) | ORAL | 0 refills | Status: DC

## 2022-06-06 MED ORDER — ATORVASTATIN CALCIUM 80 MG PO TABS: 80.0000 mg | ORAL_TABLET | Freq: Every evening | ORAL | 3 refills | Status: DC

## 2022-06-06 MED ORDER — SACUBITRIL-VALSARTAN 49-51 MG PO TABS: 1.0000 | ORAL_TABLET | Freq: Two times a day (BID) | ORAL | 1 refills | Status: DC

## 2022-06-06 MED ORDER — POTASSIUM CHLORIDE CRYS ER 10 MEQ PO TBCR: 10.0000 meq | EXTENDED_RELEASE_TABLET | Freq: Every day | ORAL | 6 refills | Status: DC

## 2022-06-06 MED ORDER — METHOCARBAMOL 500 MG PO TABS: 500.0000 mg | ORAL_TABLET | Freq: Four times a day (QID) | ORAL | 0 refills | Status: DC | PRN

## 2022-06-06 MED ORDER — METOPROLOL SUCCINATE ER 25 MG PO TB24: 25.0000 mg | ORAL_TABLET | Freq: Two times a day (BID) | ORAL | 0 refills | Status: DC

## 2022-06-06 MED ORDER — AMOXICILLIN-POT CLAVULANATE 875-125 MG PO TABS: 1.0000 | ORAL_TABLET | Freq: Two times a day (BID) | ORAL | 1 refills | Status: DC

## 2022-06-06 MED ORDER — ALPRAZOLAM 0.25 MG PO TABS: 0.2500 mg | ORAL_TABLET | Freq: Two times a day (BID) | ORAL | 0 refills | Status: DC | PRN

## 2022-06-06 MED ORDER — ENSURE ENLIVE PO LIQD: 237.0000 mL | Freq: Two times a day (BID) | ORAL | 12 refills | Status: DC

## 2022-06-06 MED ORDER — AMIODARONE HCL 400 MG PO TABS: 400.0000 mg | ORAL_TABLET | Freq: Two times a day (BID) | ORAL | 0 refills | Status: DC

## 2022-06-06 MED ORDER — DAPAGLIFLOZIN PROPANEDIOL 10 MG PO TABS: 10.0000 mg | ORAL_TABLET | Freq: Every day | ORAL | 3 refills | Status: DC

## 2022-06-06 MED ORDER — OXYCODONE-ACETAMINOPHEN 5-325 MG PO TABS: 1.0000 | ORAL_TABLET | Freq: Four times a day (QID) | ORAL | 0 refills | Status: DC | PRN

## 2022-06-06 MED ORDER — MEXILETINE HCL 150 MG PO CAPS: 150.0000 mg | ORAL_CAPSULE | Freq: Three times a day (TID) | ORAL | 0 refills | Status: DC

## 2022-06-06 MED ORDER — NYSTATIN 100000 UNIT/GM EX POWD: Freq: Two times a day (BID) | CUTANEOUS | 0 refills | Status: DC

## 2022-06-06 MED ORDER — POLYETHYLENE GLYCOL 3350 17 G PO PACK: 17.0000 g | PACK | Freq: Two times a day (BID) | ORAL | 0 refills | Status: DC

## 2022-06-06 MED ORDER — DICLOFENAC SODIUM 1 % EX GEL: 2.0000 g | Freq: Four times a day (QID) | CUTANEOUS | Status: DC

## 2022-06-06 MED ORDER — FUROSEMIDE 40 MG PO TABS: 40.0000 mg | ORAL_TABLET | Freq: Every day | ORAL | 0 refills | Status: DC

## 2022-06-06 MED ORDER — PANTOPRAZOLE SODIUM 40 MG PO TBEC: DELAYED_RELEASE_TABLET | ORAL | 2 refills | Status: DC

## 2022-06-06 MED ORDER — APIXABAN 5 MG PO TABS: ORAL_TABLET | ORAL | 1 refills | Status: DC

## 2022-06-06 MED ORDER — ACETAMINOPHEN 325 MG PO TABS: 325.0000 mg | ORAL_TABLET | ORAL | Status: DC | PRN

## 2022-06-06 MED ORDER — TRAZODONE HCL 50 MG PO TABS: 25.0000 mg | ORAL_TABLET | Freq: Every evening | ORAL | 0 refills | Status: DC | PRN

## 2022-06-10 ENCOUNTER — Other Ambulatory Visit: Payer: Self-pay | Admitting: Surgery

## 2022-06-10 ENCOUNTER — Encounter: Payer: Self-pay | Admitting: Internal Medicine

## 2022-06-10 DIAGNOSIS — T8143XA Infection following a procedure, organ and space surgical site, initial encounter: Secondary | ICD-10-CM

## 2022-06-11 ENCOUNTER — Encounter: Payer: Self-pay | Admitting: Hematology

## 2022-06-11 ENCOUNTER — Encounter (HOSPITAL_COMMUNITY): Payer: Self-pay | Admitting: *Deleted

## 2022-06-11 ENCOUNTER — Other Ambulatory Visit (HOSPITAL_COMMUNITY): Payer: Self-pay | Admitting: Surgery

## 2022-06-11 ENCOUNTER — Telehealth: Payer: Self-pay

## 2022-06-11 ENCOUNTER — Encounter: Payer: Self-pay | Admitting: Physical Medicine & Rehabilitation

## 2022-06-11 ENCOUNTER — Ambulatory Visit (HOSPITAL_COMMUNITY)
Admission: RE | Admit: 2022-06-11 | Discharge: 2022-06-11 | Disposition: A | Discharge status: Home / Self Care | Payer: Commercial Managed Care - HMO | Source: Ambulatory Visit | Attending: Physician Assistant | Admitting: Physician Assistant

## 2022-06-11 ENCOUNTER — Ambulatory Visit (HOSPITAL_COMMUNITY)
Admission: RE | Admit: 2022-06-11 | Discharge: 2022-06-11 | Disposition: A | Discharge status: Home / Self Care | Payer: Commercial Managed Care - HMO | Source: Ambulatory Visit | Attending: Surgery | Admitting: Surgery

## 2022-06-11 DIAGNOSIS — I7143 Infrarenal abdominal aortic aneurysm, without rupture: Secondary | ICD-10-CM | POA: Insufficient documentation

## 2022-06-11 DIAGNOSIS — J90 Pleural effusion, not elsewhere classified: Secondary | ICD-10-CM | POA: Insufficient documentation

## 2022-06-11 DIAGNOSIS — Y848 Other medical procedures as the cause of abnormal reaction of the patient, or of later complication, without mention of misadventure at the time of the procedure: Secondary | ICD-10-CM | POA: Diagnosis not present

## 2022-06-11 DIAGNOSIS — K8020 Calculus of gallbladder without cholecystitis without obstruction: Secondary | ICD-10-CM | POA: Insufficient documentation

## 2022-06-11 DIAGNOSIS — T8143XA Infection following a procedure, organ and space surgical site, initial encounter: Secondary | ICD-10-CM

## 2022-06-11 DIAGNOSIS — K651 Peritoneal abscess: Secondary | ICD-10-CM | POA: Diagnosis not present

## 2022-06-11 HISTORY — PX: IR PATIENT EVAL TECH 0-60 MINS: IMG5564

## 2022-06-11 MED ORDER — IOHEXOL 300 MG/ML  SOLN
100.0000 mL | Freq: Once | INTRAMUSCULAR | Status: AC | PRN
Start: 2022-06-11 — End: 2022-06-11
  Administered 2022-06-11: 100 mL via INTRAVENOUS

## 2022-06-11 NOTE — Telephone Encounter (Signed)
Patient's son called and stated the clamp to the foley cath broke and he is just draining. He wants to know what to do. He also stated home health has not been out there, so a message has been sent to the social worker

## 2022-06-11 NOTE — Telephone Encounter (Signed)
Sent scheduling message to reschedule the cancelled appts from 05/28/2022 to 2 wks from today 06/11/2022 per Dr. Ernestina Penna request (message sent regarding Pt Advice Request).

## 2022-06-12 ENCOUNTER — Telehealth: Payer: Self-pay | Admitting: Hematology and Oncology

## 2022-06-12 ENCOUNTER — Encounter (HOSPITAL_COMMUNITY): Payer: Self-pay

## 2022-06-12 NOTE — Telephone Encounter (Signed)
.  Called patient to schedule appointment per 6/28 INBASKET, patient is aware of date and time.

## 2022-06-12 NOTE — Procedures (Addendum)
Patient was brought in to evaluate piece of plastic from the drain bad that had broken off inside of his abscess drain catheter at the luer lock portion. Patient was brought back to the Radiology Nurse's station and I evaluated drain along with Scharlene Corn RT. We were able to remove the piece of plastic that was broken off inside of the catheter. JP bulb was then reattached to patient's drain. Patient verbalized understanding to continue with regular flushing of catheter.

## 2022-06-19 ENCOUNTER — Inpatient Hospital Stay: Payer: PRIVATE HEALTH INSURANCE | Admitting: Internal Medicine

## 2022-06-20 ENCOUNTER — Telehealth (INDEPENDENT_AMBULATORY_CARE_PROVIDER_SITE_OTHER): Payer: Commercial Managed Care - HMO | Admitting: Neurology

## 2022-06-20 ENCOUNTER — Encounter: Payer: Self-pay | Admitting: Neurology

## 2022-06-20 VITALS — Ht 78.0 in | Wt 212.0 lb

## 2022-06-20 DIAGNOSIS — G4700 Insomnia, unspecified: Secondary | ICD-10-CM

## 2022-06-20 DIAGNOSIS — G40009 Localization-related (focal) (partial) idiopathic epilepsy and epileptic syndromes with seizures of localized onset, not intractable, without status epilepticus: Secondary | ICD-10-CM

## 2022-06-20 MED ORDER — TRAZODONE HCL 100 MG PO TABS: 100.0000 mg | ORAL_TABLET | Freq: Every day | ORAL | 3 refills | Status: DC

## 2022-06-20 MED ORDER — LEVETIRACETAM 500 MG PO TABS
ORAL_TABLET | ORAL | 3 refills | Status: DC
Start: 2022-06-20 — End: 2022-09-11

## 2022-06-20 NOTE — Progress Notes (Signed)
Virtual Visit via Video Note The purpose of this virtual visit is to provide medical care while limiting exposure to the novel coronavirus.    Consent was obtained for video visit:  Yes.   Answered questions that patient had about telehealth interaction:  Yes.   I discussed the limitations, risks, security and privacy concerns of performing an evaluation and management service by telemedicine. I also discussed with the patient that there may be a patient responsible charge related to this service. The patient expressed understanding and agreed to proceed.  Pt location: Home Physician Location: office Name of referring provider:  No ref. provider found I connected with Gregory [name] at patients initiation/request on 06/20/2022 at 10:00 AM EDT by video enabled telemedicine application and verified that I am speaking with the correct person using two identifiers. Pt MRN:  [id number] Pt DOB:  03-21-1961 Video Participants:  Gregory [name]   History of Present Illness:  The patient had a virtual video visit on 06/20/2022. 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 He is on Levetiracetam '500mg'$  BID without side effects. Since his last visit, he has been admitted to the hospital several times for recurrent VT, colon CA s/p hemicolectomy complicated by intraabdominal abscesses. He was recently discharged from inpatient rehab 2 weeks ago. He lives with his son. He reports that he is doing better and exercises regularly. He denies any seizures or seizure-like symptoms. His last GTC was in June 2019. He had 2 electrographic seizures on his ambulatory EEG in October 2019, Levetiracetam started after the EEG. He denies any staring/unresponsive episodes, gaps in time, olfactory/gustatory hallucinations,myoclonic jerks. No headaches, dizziness, no falls in over a year. He has right shoulder pain with numbness and tingling. He was dropping things  at first but this has improved. He continues to have sleep difficulties, taking Trazodone '50mg'$  qhs. When he takes '100mg'$ , it helps better. He was also started on Xanax in rehab for anxiety, he states he takes this every night.    History on Initial Assessment 09/20/2018: This is a 61 year old right-handed man with a history of iron deficiency anemia, hypertension, CHF, CAD, s/p ICD placement, presenting after 2 episodes of unresponsiveness last 06/07/18. He reports that he had been lowering BP medication due to low BP and was feeling tired and dizzy. His last recollection was sitting in the car with his friend, then has no further recollection of events until he was in the ER. Notes reviewed, his friend noted he was staring straight ahead, unresponsive, then "he started talking like he was on a game show." He was back to baseline on arrival to the ER. While in the ER, nursing staff witnessed him to be staring off into space for around 20 seconds then had generalized tonic-clonic seizure-like activity that lasted for about a minute. He had slow sonorous breathing after and was then confused, gradually coming around. It was reported that he was hypotensive on the scene. BP on arrival to the ER was 103/62. He was significantly anemic, Hgb 6.9. I personally reviewed head CT without contrast which did not show any acute changes, there was mild chronic microvascular disease. His wake and drowsy EEG was normal. Echocardiogram showed an EF of 30-09%, grade 2 diastolic dysfunction, left atrium severely dilated. Since this was the first event, and possibly provoked (hypoperfusion), seizure medication was not started. He received a blood transfusion and states that he feels "perfect" since then.  He and his son deny any other staring/unresponsive episodes, gaps in time, olfactory/gustatory hallucinations, deja vu, rising epigastric sensation, focal numbness/tingling/weakness, myoclonic jerks.He denies any headaches, diplopia,  dysarthria/dysphagia, neck/back pain, bowel/bladder dysfunction. He had a normal birth and early development.  There is no history of febrile convulsions, CNS infections such as meningitis/encephalitis, significant traumatic brain injury, neurosurgical procedures, or family history of seizures.  Diagnostic Data: 24-hour EEG done October 2019 was abnormal with occasional focal slowing over the right frontotemporal region. There were 2 electrographic seizures from the right temporal region lasting 50-150 seconds. CT head no acute changes    Current Outpatient Medications on File Prior to Visit  Medication Sig Dispense Refill   acetaminophen (TYLENOL) 325 MG tablet Take 1-2 tablets (325-650 mg total) by mouth every 4 (four) hours as needed for mild pain.     ALPRAZolam (XANAX) 0.25 MG tablet Take 1 tablet (0.25 mg total) by mouth 2 (two) times daily as needed for anxiety. 30 tablet 0   amiodarone (PACERONE) 400 MG tablet Take 1 tablet (400 mg total) by mouth 2 (two) times daily. 60 tablet 0   amoxicillin-clavulanate (AUGMENTIN) 875-125 MG tablet Take 1 tablet by mouth every 12 (twelve) hours. 28 tablet 1   apixaban (ELIQUIS) 5 MG TABS tablet TAKE 1 TABLET(5 MG) BY MOUTH TWICE DAILY 180 tablet 1   atorvastatin (LIPITOR) 80 MG tablet Take 1 tablet (80 mg total) by mouth every evening. 90 tablet 3   dapagliflozin propanediol (FARXIGA) 10 MG TABS tablet Take 1 tablet (10 mg total) by mouth daily. 90 tablet 3   diclofenac Sodium (VOLTAREN) 1 % GEL Apply 2 g topically 4 (four) times daily.     docusate sodium (COLACE) 100 MG capsule Take 1 capsule (100 mg total) by mouth 2 (two) times daily. 10 capsule 0   ezetimibe (ZETIA) 10 MG tablet Take 1 tablet (10 mg total) by mouth daily. 30 tablet 0   feeding supplement (ENSURE ENLIVE / ENSURE PLUS) LIQD Take 237 mLs by mouth 2 (two) times daily between meals. 237 mL 12   furosemide (LASIX) 40 MG tablet Take 1 tablet (40 mg total) by mouth daily. 30 tablet 0    levETIRAcetam (KEPPRA) 500 MG tablet TAKE 1 TABLET(500 MG) BY MOUTH TWICE DAILY (Patient taking differently: Take 500 mg by mouth 2 (two) times daily.) 60 tablet 3   methocarbamol (ROBAXIN) 500 MG tablet Take 1 tablet (500 mg total) by mouth every 6 (six) hours as needed for muscle spasms. 30 tablet 0   metoprolol succinate (TOPROL-XL) 25 MG 24 hr tablet Take 1 tablet (25 mg total) by mouth 2 (two) times daily. 60 tablet 0   mexiletine (MEXITIL) 150 MG capsule Take 1 capsule (150 mg total) by mouth every 8 (eight) hours. 30 capsule 0   nystatin (MYCOSTATIN/NYSTOP) powder Apply topically 2 (two) times daily. Apply To groin area 15 g 0   oxyCODONE-acetaminophen (PERCOCET/ROXICET) 5-325 MG tablet Take 1 tablet by mouth every 6 (six) hours as needed for moderate pain or severe pain. 21 tablet 0   pantoprazole (PROTONIX) 40 MG tablet TAKE 1 TABLET(40 MG) BY MOUTH DAILY 30 tablet 2   polyethylene glycol (MIRALAX / GLYCOLAX) 17 g packet Take 17 g by mouth 2 (two) times daily. 14 each 0   potassium chloride (KLOR-CON M) 10 MEQ tablet Take 1 tablet (10 mEq total) by mouth daily. 30 tablet 6   sacubitril-valsartan (ENTRESTO) 49-51 MG Take 1 tablet by mouth 2 (two) times daily. [place]  tablet 1   sildenafil (VIAGRA) 50 MG tablet Take 1 tablet (50 mg total) by mouth as needed for erectile dysfunction. 30 tablet 4   sodium chloride flush 0.9 % SOLN injection 5 mLs by Intracatheter route daily for 20 days. 100 mL 0   traZODone (DESYREL) 50 MG tablet Take 0.5-1 tablets (25-50 mg total) by mouth at bedtime as needed for sleep. 30 tablet 0   No current facility-administered medications on file prior to visit.     Observations/Objective:   Vitals:   06/20/22 0901  Weight: 212 lb (96.2 kg)  Height: '6\' 6"'$  (1.981 m)   GEN:  The patient appears stated age and is in NAD.  Neurological examination: Patient is awake, alert. No aphasia or dysarthria. Intact fluency and comprehension. Cranial nerves: Extraocular  movements intact. Pterygium on lateral sides of both eyes No facial asymmetry. Motor: moves all extremities symmetrically, at least anti-gravity x 4.   Assessment and Plan:   This is a pleasant 61 yo RH man with a history of  iron deficiency anemia, hypertension, CHF, CAD, VT s/p ICD placement,atrial fibrillation, who had 2 episodes of unresponsiveness with staring last 06/07/18, the second episode witnessed in the ER was followed by convulsive activity. His 24-hour EEG in October 2019 captured 2 electrographic seizures arising from the right temporal region. He has been on Levetiracetam since then with no further seizures or seizure-like symptoms since 09/2018, refills sent for Levetiracetam '500mg'$  BID. He continues to report sleep difficulties, increase Trazodone to '100mg'$  qhs. He was started on Xanax in the hospital, we discussed that Trazodone can also help with anxiety, hopefully he can wean off Xanax intake with increase in Trazodone dose. He will discuss this with his PCP. He is aware of [place] driving laws to stop driving after a seizure until 6 months seizure-free. Follow-up in 1 year, call for any changes.     Follow Up Instructions:   -I discussed the assessment and treatment plan with the patient. The patient was provided an opportunity to ask questions and all were answered. The patient agreed with the plan and demonstrated an understanding of the instructions.   The patient was advised to call back or seek an in-person evaluation if the symptoms worsen or if the condition fails to improve as anticipated.      [name], MD

## 2022-06-20 NOTE — Patient Instructions (Signed)
Good to see you doing better.  Continue Levetiracetam (Keppra) '500mg'$  twice a day  2. Increase Trazodone to '100mg'$  every night. A new prescription has been sent for Trazodone '100mg'$ : Take 1 tablet at bedtime.  3. Hopefully the Trazodone helps with anxiety as well so you can start weaning off the Xanax intake. Discuss Xanax with PCP  4. Follow-up in 1 year, call for any changes   Seizure Precautions: 1. If medication has been prescribed for you to prevent seizures, take it exactly as directed.  Do not stop taking the medicine without talking to your doctor first, even if you have not had a seizure in a long time.   2. Avoid activities in which a seizure would cause danger to yourself or to others.  Don't operate dangerous machinery, swim alone, or climb in high or dangerous places, such as on ladders, roofs, or girders.  Do not drive unless your doctor says you may.  3. If you have any warning that you may have a seizure, lay down in a safe place where you can't hurt yourself.    4.  No driving for 6 months from last seizure, as per [facility]   Please refer to the following link on the [place] website for more information: http://www.epilepsyfoundation.org/answerplace/Social/driving/drivingu.cfm   5.  Maintain good sleep hygiene. Avoid alcohol.  6.  Contact your doctor if you have any problems that may be related to the medicine you are taking.  7.  Call 911 and bring the patient back to the ED if:        A.  The seizure lasts longer than 5 minutes.       B.  The patient doesn't awaken shortly after the seizure  C.  The patient has new problems such as difficulty seeing, speaking or moving  D.  The patient was injured during the seizure  E.  The patient has a temperature over 102 F (39C)  F.  The patient vomited and now is having trouble breathing

## 2022-06-24 ENCOUNTER — Other Ambulatory Visit: Payer: Self-pay

## 2022-06-24 ENCOUNTER — Ambulatory Visit: Payer: Commercial Managed Care - HMO | Admitting: Neurology

## 2022-06-24 ENCOUNTER — Other Ambulatory Visit: Payer: Self-pay | Admitting: Hematology

## 2022-06-24 DIAGNOSIS — C182 Malignant neoplasm of ascending colon: Secondary | ICD-10-CM

## 2022-06-24 DIAGNOSIS — D509 Iron deficiency anemia, unspecified: Secondary | ICD-10-CM

## 2022-06-24 NOTE — Progress Notes (Unsigned)
Gregory [name]   Telephone:([phone]) [phone] Fax:(336) [phone]   Clinic Follow up Note   Patient Care Team: Patient, No Pcp Per as PCP - General (General Practice) Croitoru, Dani Gobble, MD as PCP - Cardiology (Cardiology) [name], MD as PCP - Electrophysiology (Cardiology) [name], MD as Consulting Physician (Neurology) [name], MD as Consulting Physician (Hematology) Stechschulte, Nickola Major, MD as Consulting Physician (Surgery)  Date of Service:  06/25/2022  CHIEF COMPLAINT: f/u of colon cancer  CURRENT THERAPY:  PENDING  ASSESSMENT & PLAN:  Gregory [name] is a 61 y.o. male with   1. 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 FOBT test positive. Colonoscopy on 04/16/22 with Dr. Havery Moros confirmed adenocarcinoma. -s/p resection on 04/18/22 by Dr. Thermon Leyland, path showed: 6.4 cm moderate to poorly differentiated adenocarcinoma; 11/35 nodes were positive; lymphovascular space invasion present; margins negative. MMR normal. Of note, appendix was uninvolved.  -I discussed the work up and results thus far with them today.  The CT scan from May 13, 2022 showed bilateral adrenal nodules and multiple hypervascular lesions in the pancreas, they are indeterminate, but concerning for metastatic disease. -He had a very slow recovery from surgery, and also developed intra-abdominal abscess which required percutaneous draining on May 22, 2022, the output from drain tube has decreased to 20 cc daily lately. -I reviewed his recent CT scan from June 11, 2022, which showed decreased peripancreatic hematoma, cystic lesion inferior to the use in 8 process is stable, and stable adrenal nodules. -I recommend adjuvant chemotherapy to reduce his very high risk of recurrence.  He was discharged from rehabilitation, overall  health has improved, but his performance status still low, plus the significant CHF, he is not a candidate for intensive chemotherapy, such as FOLFOX or CapeOx.  I recommend him to start a single agent Xeloda when he is abdominal drainage tube is removed. --Chemotherapy consent: Side effects including but does not not limited to, fatigue, nausea, vomiting, diarrhea, hair loss, neuropathy, fluid retention, renal and kidney dysfunction, neutropenic fever, needed for blood transfusion, bleeding, were discussed with patient in great detail. He agrees to proceed.   2. Anemia from GI bleeding and iron deficiency -secondary to #1 -he received IV Feraheme and blood transfusion during hospitalization -will obtain labs today to see how he has responded.  3.  CAD, A-fib, CHF with EF 20%, status post AICD placement -Follow-up with cardiology -He is on multiple cardiac medications, will continue  PLAN: -I called in Xeloda 2049m q12h for day 1-14 every 21 days, he will not start until his drain tube removed. I will see him back in 2 weeks -He has follow-up with IR tomorrow.   No problem-specific Assessment & Plan notes found for this encounter.   SUMMARY OF ONCOLOGIC HISTORY: Oncology History Overview Note   Cancer Staging  Cancer of right colon ([facility] Staging form: Colon and Rectum, AJCC 8th Edition - Pathologic stage from 04/22/2022: pT3, pN2b, cM1 - Signed by FTruitt Merle MD on 05/05/2022    Cancer of right colon ([facility]  04/12/2022 Miscellaneous   Fecal Occult Bld - POSITIVE   04/16/2022 Imaging   EXAM: CT CHEST, ABDOMEN, AND PELVIS WITH CONTRAST  IMPRESSION: 1. Infiltrative mass in the ascending colon concerning for primary colonic neoplasm, estimated to measure approximately 6.5 x 4.7 x 7.4 cm, with evidence of local  infiltration of disease in the pericolonic space adjacent to the lesion, as well as pathologically enlarged ileocolic and retroperitoneal lymphadenopathy, concerning for nodal  metastasis. 2. Bilateral adrenal nodules which are indeterminate, but potentially metastatic. 3. Multiple hypovascular lesions scattered throughout the pancreas. The possibility of metastatic disease to the pancreas should be considered. Alternatively, benign etiologies such as chronic pancreatic pseudocysts could have a similar appearance (no recent prior studies are available for comparison). Close attention on follow-up studies is recommended to ensure the stability or regression of these pancreatic lesions. 4. Small to moderate left pleural effusion lying dependently with some passive subsegmental atelectasis in the left lower lobe. 5. Cardiomegaly with left atrial dilatation. 6. Aortic atherosclerosis, in addition to left main and three-vessel coronary artery disease. Please note that although the presence of coronary artery calcium documents the presence of coronary artery disease, the severity of this disease and any potential stenosis cannot be assessed on this non-gated CT examination. Assessment for potential risk factor modification, dietary therapy or pharmacologic therapy may be warranted, if clinically indicated. 7. There are calcifications of the aortic valve. Echocardiographic correlation for evaluation of potential valvular dysfunction may be warranted if clinically indicated. 8. Additional incidental findings, as above.   04/16/2022 Procedure   Colonoscopy; Dr. Havery Moros  Impression: -Stool in the entire examined colon, several minutes spent lavaging the colon - Likely malignant partially obstructing tumor in the suspected proximal transverse colon vs. right colon as above - could not traverse it. Biopsied. Tattooed. - Five 3 to 10 mm polyps in the transverse colon, removed with a cold snare. Resected and retrieved. - Two 6 to 8 mm polyps in the transverse colon, removed with a hot snare. Resected and retrieved. - One 6 mm polyp in the descending colon, removed with a cold  snare. Resected and retrieved. - Two 3 to 4 mm polyps in the sigmoid colon, removed with a cold snare. Resected and retrieved. - One 15 mm polyp in the sigmoid colon, removed with a hot snare. Resected and retrieved. - One 3 to 4 mm polyp at the recto-sigmoid colon, removed with a cold snare. Resected and retrieved. - Internal hemorrhoids. - The examination was otherwise normal.  Colon mass is likely malignant and the cause of anemia / bleeding.   04/16/2022 Initial Biopsy   FINAL MICROSCOPIC DIAGNOSIS:   A. COLON MASS, BIOPSY:  - Moderately differentiated colonic adenocarcinoma.  - See comment.   B. COLON, TRANSVERSE, DESCENDING AND SIGMOID, POLYPECTOMY:  - Tubular adenoma (s) without high grade dysplasia.  - Sessile serrated adenoma(s) without cytologic dysplasia.   C. COLON, SIGMOID, POLYPECTOMY:  - Tubular adenoma without high grade dysplasia.    04/17/2022 Initial Diagnosis   Cancer of right colon ([place])   04/18/2022 Definitive Surgery   FINAL MICROSCOPIC DIAGNOSIS:   A. COLON, RIGHT, RESECTION:  -  Adenocarcinoma, moderate to poorly differentiated, 6.4 cm  -  Metastatic adenocarcinoma involving eleven of thirty-five lymph nodes (11/35)  -  Lymphovascular space invasion present  -  Margins uninvolved by carcinoma  -  Multiple tubular adenomas  -  Appendix uninvolved by adenocarcinoma  -  See oncology table and comment below   ADDENDUM:  Mismatch Repair Protein (IHC)  SUMMARY INTERPRETATION: NORMAL   04/22/2022 Cancer Staging   Staging form: Colon and Rectum, AJCC 8th Edition - Pathologic stage from 04/22/2022: pT3, pN2b, cM1 - Signed by [name], MD on 05/05/2022 Stage prefix: Initial diagnosis Histologic grading system: 4 grade system Histologic grade (G): G3 Residual  tumor (R): R0 - None      INTERVAL HISTORY:  Gregory [name] is here for a follow up of colon cancer.  He was discharged home from rehab on 6/23, still doing exercise at home. He is able to  walk independently, including stairs, but does use crane and walker if needed.  Appetite is normal, eating well, and takes Ensure once a day, but still loosing some weight He still has the abdominal drain with 20cc output daily recently  All other systems were reviewed with the patient and are negative.  MEDICAL HISTORY:  Past Medical History:  Diagnosis Date   AICD (automatic cardioverter/defibrillator) present 2005   CAD (coronary artery disease) 12/01/2013   Chronic combined systolic and diastolic CHF, NYHA class 1 ([place]) 12/01/2013   Erectile dysfunction 12/01/2013   HTN (hypertension) 12/01/2013   Hyperlipidemia 12/01/2013   Ischemic cardiomyopathy 12/01/2013   Presence of permanent cardiac pacemaker    Sleep apnea     SURGICAL HISTORY: Past Surgical History:  Procedure Laterality Date   ACHILLES TENDON REPAIR     lft foot   BIOPSY  04/16/2022   Procedure: BIOPSY;  Surgeon: Yetta Flock, MD;  Location: [place];  Service: Gastroenterology;;   CARDIAC CATHETERIZATION N/A 05/05/2016   Procedure: Left Heart Cath and Coronary Angiography;  Surgeon: Leonie Man, MD;  Location: [place] CV LAB;  Service: Cardiovascular;  Laterality: N/A;   CARDIAC DEFIBRILLATOR PLACEMENT     CARDIOVERSION N/A 09/13/2020   Procedure: CARDIOVERSION;  Surgeon: Werner Lean, MD;  Location: [place] ENDOSCOPY;  Service: Cardiovascular;  Laterality: N/A;   CARDIOVERSION N/A 09/19/2020   Procedure: CARDIOVERSION;  Surgeon: [name], MD;  Location: [facility] ENDOSCOPY;  Service: Cardiovascular;  Laterality: N/A;   COLON RESECTION N/A 04/18/2022   Procedure: HAND ASSISTED LAPAROSCOPIC RIGHT COLON RESECTION;  Surgeon: Felicie Morn, MD;  Location: [place];  Service: General;  Laterality: N/A;   COLONOSCOPY WITH PROPOFOL N/A 04/16/2022   Procedure: COLONOSCOPY WITH PROPOFOL;  Surgeon: Yetta Flock, MD;  Location: [place];  Service: Gastroenterology;  Laterality: N/A;   EP  IMPLANTABLE DEVICE N/A 12/19/2016   Procedure: ICD Generator Changeout;  Surgeon: [name], MD;  Location: [place] CV LAB;  Service: Cardiovascular;  Laterality: N/A;   ESOPHAGOGASTRODUODENOSCOPY N/A 04/16/2022   Procedure: ESOPHAGOGASTRODUODENOSCOPY (EGD);  Surgeon: Yetta Flock, MD;  Location[facility] ENDOSCOPY;  Service: Gastroenterology;  Laterality: N/A;   IR ANGIOGRAM SELECTIVE EACH ADDITIONAL VESSEL  05/09/2022   IR ANGIOGRAM VISCERAL SELECTIVE  05/09/2022   IR CATHETER TUBE CHANGE  05/14/2022   IR EMBO ART  VEN HEMORR LYMPH EXTRAV  INC GUIDE ROADMAPPING  05/09/2022   IR IMAGE GUIDED DRAINAGE PERCUT CATH  PERITONEAL RETROPERIT  05/09/2022   IR PATIENT EVAL TECH 0-60 MINS  06/11/2022   IR US GUIDE VASC ACCESS RIGHT  05/09/2022   LEFT HEART CATH AND CORONARY ANGIOGRAPHY N/A 04/17/2022   Procedure: LEFT HEART CATH AND CORONARY ANGIOGRAPHY;  Surgeon: Belva Crome, MD;  Location: [place] CV LAB;  Service: Cardiovascular;  Laterality: N/A;   POLYPECTOMY  04/16/2022   Procedure: POLYPECTOMY;  Surgeon: Yetta Flock, MD;  Location: [facility] ENDOSCOPY;  Service: Gastroenterology;;   RIGHT/LEFT HEART CATH AND CORONARY ANGIOGRAPHY N/A 09/24/2020   Procedure: RIGHT/LEFT HEART CATH AND CORONARY ANGIOGRAPHY;  Surgeon: Lorretta Harp, MD;  Location: [place] CV LAB;  Service: Cardiovascular;  Laterality: N/A;   SUBMUCOSAL TATTOO INJECTION  04/16/2022   Procedure: SUBMUCOSAL  TATTOO INJECTION;  Surgeon: Yetta Flock, MD;  Location: [facility] ENDOSCOPY;  Service: Gastroenterology;;   WRIST TENODESIS      I have reviewed the social history and family history with the patient and they are unchanged from previous note.  ALLERGIES:  has No Known Allergies.  MEDICATIONS:  Current Outpatient Medications  Medication Sig Dispense Refill   acetaminophen (TYLENOL) 325 MG tablet Take 1-2 tablets (325-650 mg total) by mouth every 4 (four) hours as needed for mild pain.     ALPRAZolam (XANAX) 0.25 MG  tablet Take 1 tablet (0.25 mg total) by mouth at bedtime as needed for anxiety or sleep. 30 tablet 0   amiodarone (PACERONE) 400 MG tablet Take 1 tablet (400 mg total) by mouth 2 (two) times daily. 60 tablet 0   amoxicillin-clavulanate (AUGMENTIN) 875-125 MG tablet Take 1 tablet by mouth every 12 (twelve) hours. 28 tablet 1   apixaban (ELIQUIS) 5 MG TABS tablet TAKE 1 TABLET(5 MG) BY MOUTH TWICE DAILY 180 tablet 1   atorvastatin (LIPITOR) 80 MG tablet Take 1 tablet (80 mg total) by mouth every evening. 90 tablet 3   capecitabine (XELODA) 500 MG tablet Take 4 tablets every 12 hours for 14 days on and 7 days off, take after a meal. 112 tablet 0   dapagliflozin propanediol (FARXIGA) 10 MG TABS tablet Take 1 tablet (10 mg total) by mouth daily. 90 tablet 3   diclofenac Sodium (VOLTAREN) 1 % GEL Apply 2 g topically 4 (four) times daily.     docusate sodium (COLACE) 100 MG capsule Take 1 capsule (100 mg total) by mouth 2 (two) times daily. 10 capsule 0   ezetimibe (ZETIA) 10 MG tablet Take 1 tablet (10 mg total) by mouth daily. 30 tablet 0   feeding supplement (ENSURE ENLIVE / ENSURE PLUS) LIQD Take 237 mLs by mouth 2 (two) times daily between meals. 237 mL 12   furosemide (LASIX) 40 MG tablet Take 1 tablet (40 mg total) by mouth daily. 30 tablet 0   levETIRAcetam (KEPPRA) 500 MG tablet Take 1 tablet twice a day 180 tablet 3   methocarbamol (ROBAXIN) 500 MG tablet Take 1 tablet (500 mg total) by mouth every 6 (six) hours as needed for muscle spasms. 30 tablet 0   metoprolol succinate (TOPROL-XL) 25 MG 24 hr tablet Take 1 tablet (25 mg total) by mouth 2 (two) times daily. 60 tablet 0   mexiletine (MEXITIL) 150 MG capsule Take 1 capsule (150 mg total) by mouth every 8 (eight) hours. 30 capsule 0   nystatin (MYCOSTATIN/NYSTOP) powder Apply topically 2 (two) times daily. Apply To groin area 15 g 0   oxyCODONE-acetaminophen (PERCOCET/ROXICET) 5-325 MG tablet Take 1 tablet by mouth every 6 (six) hours as needed  for moderate pain or severe pain. 21 tablet 0   pantoprazole (PROTONIX) 40 MG tablet TAKE 1 TABLET(40 MG) BY MOUTH DAILY 30 tablet 2   polyethylene glycol (MIRALAX / GLYCOLAX) 17 g packet Take 17 g by mouth 2 (two) times daily. 14 each 0   potassium chloride (KLOR-CON M) 10 MEQ tablet Take 1 tablet (10 mEq total) by mouth daily. 30 tablet 6   sacubitril-valsartan (ENTRESTO) 49-51 MG Take 1 tablet by mouth 2 (two) times daily. 180 tablet 1   sildenafil (VIAGRA) 50 MG tablet Take 1 tablet (50 mg total) by mouth as needed for erectile dysfunction. 30 tablet 4   sodium chloride flush 0.9 % SOLN injection 5 mLs by Intracatheter route daily for 20  days. 100 mL 0   traZODone (DESYREL) 100 MG tablet Take 1 tablet (100 mg total) by mouth at bedtime. 90 tablet 3   No current facility-administered medications for this visit.    PHYSICAL EXAMINATION: ECOG PERFORMANCE STATUS: 3 - Symptomatic, >50% confined to bed  Vitals:   06/25/22 1131  BP: (!) 143/95  Pulse: 82  Temp: 97.7 F (36.5 C)  SpO2: 99%    Wt Readings from Last 3 Encounters:  06/25/22 191 lb 11.2 oz (87 kg)  06/20/22 212 lb (96.2 kg)  06/05/22 199 lb 15.3 oz (90.7 kg)     GENERAL:alert, no distress and comfortable SKIN: skin color, texture, turgor are normal, no rashes or significant lesions EYES: normal, Conjunctiva are pink and non-injected, sclera clear  NECK: supple, thyroid normal size, non-tender, without nodularity LYMPH:  no palpable lymphadenopathy in the cervical, axillary  LUNGS: clear to auscultation and percussion with normal breathing effort HEART: regular rate & rhythm and no murmurs and no lower extremity edema ABDOMEN:abdomen soft, non-tender and normal bowel sounds. (+) drain tube in right side abdomen, covered  Musculoskeletal:no cyanosis of digits and no clubbing  NEURO: alert & oriented x 3 with fluent speech, no focal motor/sensory deficits  LABORATORY DATA:  I have reviewed the data as listed     Latest Ref Rng & Units 06/25/2022   11:06 AM 06/06/2022    5:14 AM 06/02/2022    6:10 AM  CBC  WBC 4.0 - 10.5 K/uL 8.2  9.6  8.7   Hemoglobin 13.0 - 17.0 g/dL 13.7  10.9  11.7   Hematocrit 39.0 - 52.0 % 44.1  37.0  39.1   Platelets 150 - 400 K/uL 280  313  321         Latest Ref Rng & Units 06/25/2022   11:06 AM 06/06/2022    5:14 AM 06/02/2022    6:10 AM  CMP  Glucose 70 - 99 mg/dL 92  125  111   BUN 8 - 23 mg/dL _0 Creatinine 0.61 - 1.24 mg/dL 0.78  0.77  0.96   Sodium 135 - 145 mmol/L 137  134  136   Potassium 3.5 - 5.1 mmol/L 4.3  4.4  4.4   Chloride 98 - 111 mmol/L 102  101  100   CO2 22 - 32 mmol/L _1 Calcium 8.9 - 10.3 mg/dL 9.0  8.2  8.1   Total Protein 6.5 - 8.1 g/dL 7.8     Total Bilirubin 0.3 - 1.2 mg/dL 0.3     Alkaline Phos 38 - 126 U/L 157     AST 15 - 41 U/L 39     ALT 0 - 44 U/L 59         RADIOGRAPHIC STUDIES: I have personally reviewed the radiological images as listed and agreed with the findings in the report. No results found.    No orders of the defined types were placed in this encounter.  All questions were answered. The patient knows to call the clinic with any problems, questions or concerns. No barriers to learning was detected. The total time spent in the appointment was 30 minutes.     [name], MD 06/25/2022

## 2022-06-25 ENCOUNTER — Other Ambulatory Visit (HOSPITAL_COMMUNITY): Payer: Self-pay

## 2022-06-25 ENCOUNTER — Encounter: Payer: Self-pay | Admitting: Hematology

## 2022-06-25 ENCOUNTER — Inpatient Hospital Stay: Payer: Commercial Managed Care - HMO | Attending: Hematology

## 2022-06-25 ENCOUNTER — Telehealth: Payer: Self-pay

## 2022-06-25 ENCOUNTER — Other Ambulatory Visit: Payer: Self-pay

## 2022-06-25 ENCOUNTER — Telehealth: Payer: Self-pay | Admitting: Pharmacist

## 2022-06-25 ENCOUNTER — Inpatient Hospital Stay (HOSPITAL_BASED_OUTPATIENT_CLINIC_OR_DEPARTMENT_OTHER): Payer: Commercial Managed Care - HMO | Admitting: Hematology

## 2022-06-25 VITALS — BP 143/95 | HR 82 | Temp 97.7°F | Ht 78.0 in | Wt 191.7 lb

## 2022-06-25 DIAGNOSIS — I2510 Atherosclerotic heart disease of native coronary artery without angina pectoris: Secondary | ICD-10-CM | POA: Insufficient documentation

## 2022-06-25 DIAGNOSIS — Z7901 Long term (current) use of anticoagulants: Secondary | ICD-10-CM | POA: Diagnosis not present

## 2022-06-25 DIAGNOSIS — Z79899 Other long term (current) drug therapy: Secondary | ICD-10-CM | POA: Insufficient documentation

## 2022-06-25 DIAGNOSIS — D509 Iron deficiency anemia, unspecified: Secondary | ICD-10-CM

## 2022-06-25 DIAGNOSIS — I255 Ischemic cardiomyopathy: Secondary | ICD-10-CM | POA: Insufficient documentation

## 2022-06-25 DIAGNOSIS — I700 Atherosclerosis of aorta: Secondary | ICD-10-CM | POA: Insufficient documentation

## 2022-06-25 DIAGNOSIS — D125 Benign neoplasm of sigmoid colon: Secondary | ICD-10-CM | POA: Diagnosis not present

## 2022-06-25 DIAGNOSIS — D124 Benign neoplasm of descending colon: Secondary | ICD-10-CM | POA: Insufficient documentation

## 2022-06-25 DIAGNOSIS — D123 Benign neoplasm of transverse colon: Secondary | ICD-10-CM | POA: Diagnosis not present

## 2022-06-25 DIAGNOSIS — K922 Gastrointestinal hemorrhage, unspecified: Secondary | ICD-10-CM | POA: Insufficient documentation

## 2022-06-25 DIAGNOSIS — C182 Malignant neoplasm of ascending colon: Secondary | ICD-10-CM | POA: Insufficient documentation

## 2022-06-25 DIAGNOSIS — D500 Iron deficiency anemia secondary to blood loss (chronic): Secondary | ICD-10-CM | POA: Insufficient documentation

## 2022-06-25 DIAGNOSIS — K648 Other hemorrhoids: Secondary | ICD-10-CM | POA: Diagnosis not present

## 2022-06-25 DIAGNOSIS — J90 Pleural effusion, not elsewhere classified: Secondary | ICD-10-CM | POA: Insufficient documentation

## 2022-06-25 DIAGNOSIS — I4891 Unspecified atrial fibrillation: Secondary | ICD-10-CM | POA: Diagnosis not present

## 2022-06-25 DIAGNOSIS — I5042 Chronic combined systolic (congestive) and diastolic (congestive) heart failure: Secondary | ICD-10-CM | POA: Diagnosis not present

## 2022-06-25 LAB — IRON AND IRON BINDING CAPACITY (CC-WL,HP ONLY)
Iron: 129 ug/dL (ref 45–182)
Saturation Ratios: 40 % — ABNORMAL HIGH (ref 17.9–39.5)
TIBC: 325 ug/dL (ref 250–450)
UIBC: 196 ug/dL (ref 117–376)

## 2022-06-25 LAB — COMPREHENSIVE METABOLIC PANEL
ALT: 59 U/L — ABNORMAL HIGH (ref 0–44)
AST: 39 U/L (ref 15–41)
Albumin: 3.7 g/dL (ref 3.5–5.0)
Alkaline Phosphatase: 157 U/L — ABNORMAL HIGH (ref 38–126)
Anion gap: 5 (ref 5–15)
BUN: 14 mg/dL (ref 8–23)
CO2: 30 mmol/L (ref 22–32)
Calcium: 9 mg/dL (ref 8.9–10.3)
Chloride: 102 mmol/L (ref 98–111)
Creatinine, Ser: 0.78 mg/dL (ref 0.61–1.24)
GFR, Estimated: >60 mL/min (ref >60)
Glucose, Bld: 92 mg/dL (ref 70–99)
Potassium: 4.3 mmol/L (ref 3.5–5.1)
Sodium: 137 mmol/L (ref 135–145)
Total Bilirubin: 0.3 mg/dL (ref 0.3–1.2)
Total Protein: 7.8 g/dL (ref 6.5–8.1)

## 2022-06-25 LAB — CBC WITH DIFFERENTIAL/PLATELET
Abs Immature Granulocytes: 0.02 10*3/uL (ref 0.00–0.07)
Basophils Absolute: 0 10*3/uL (ref 0.0–0.1)
Basophils Relative: 1 %
Eosinophils Absolute: 0.3 10*3/uL (ref 0.0–0.5)
Eosinophils Relative: 3 %
HCT: 44.1 % (ref 39.0–52.0)
Hemoglobin: 13.7 g/dL (ref 13.0–17.0)
Immature Granulocytes: 0 %
Lymphocytes Relative: 14 %
Lymphs Abs: 1.1 10*3/uL (ref 0.7–4.0)
MCH: 28 pg (ref 26.0–34.0)
MCHC: 31.1 g/dL (ref 30.0–36.0)
MCV: 90 fL (ref 80.0–100.0)
Monocytes Absolute: 0.5 10*3/uL (ref 0.1–1.0)
Monocytes Relative: 7 %
Neutro Abs: 6.2 10*3/uL (ref 1.7–7.7)
Neutrophils Relative %: 75 %
Platelets: 280 10*3/uL (ref 150–400)
RBC: 4.9 MIL/uL (ref 4.22–5.81)
RDW: 20.9 % — ABNORMAL HIGH (ref 11.5–15.5)
WBC: 8.2 10*3/uL (ref 4.0–10.5)
nRBC: 0 % (ref 0.0–0.2)

## 2022-06-25 LAB — FERRITIN: Ferritin: 30 ng/mL (ref 24–336)

## 2022-06-25 LAB — CEA (IN HOUSE-CHCC): CEA (CHCC-In House): 5.65 ng/mL — ABNORMAL HIGH (ref 0.00–5.00)

## 2022-06-25 MED ORDER — CAPECITABINE 500 MG PO TABS
ORAL_TABLET | ORAL | 0 refills | Status: DC
  Filled 2022-06-25: qty 112, fill #0

## 2022-06-25 MED ORDER — CAPECITABINE 500 MG PO TABS
ORAL_TABLET | ORAL | 0 refills | Status: DC
  Filled 2022-06-25: qty 112, fill #0
  Filled 2022-07-17: qty 112, 21d supply, fill #0

## 2022-06-25 MED ORDER — ALPRAZOLAM 0.25 MG PO TABS: 0.2500 mg | ORAL_TABLET | Freq: Every evening | ORAL | 0 refills | Status: DC | PRN

## 2022-06-25 NOTE — Telephone Encounter (Signed)
Patient Advocate Encounter   Received notification that prior authorization for Xeloda is required by  [place].   PA submitted on 06/25/2022 Key [id number] Status is pending  Clista Bernhardt, CPhT Patient Advocate

## 2022-06-25 NOTE — Telephone Encounter (Signed)
Order given by Dr. Burr Medico to schedule EKG w/next office visit d/t pt will start Capecitabine which has a prolonged QT Time d/t drug interaction with Amiodarone.  Dr. Burr Medico will continue to monitor the pt's QT while taking Capecitabine.

## 2022-06-25 NOTE — Telephone Encounter (Signed)
Oral Oncology Patient Advocate Encounter  Submitted documentation via phone to Christella Scheuermann [phone]  Case ID: [id number] Status is pending     Lady Deutscher, [place] Patient Advocate Specialist [place] Patient Advocate Team Direct Number: [phone]  Fax: [phone]

## 2022-06-26 ENCOUNTER — Ambulatory Visit
Admission: RE | Admit: 2022-06-26 | Discharge: 2022-06-26 | Disposition: A | Discharge status: Home / Self Care | Payer: Commercial Managed Care - HMO | Source: Ambulatory Visit | Attending: Surgery | Admitting: Surgery

## 2022-06-26 ENCOUNTER — Other Ambulatory Visit: Payer: Self-pay

## 2022-06-26 ENCOUNTER — Ambulatory Visit
Admission: RE | Admit: 2022-06-26 | Discharge: 2022-06-26 | Disposition: A | Discharge status: Home / Self Care | Payer: Commercial Managed Care - HMO | Source: Ambulatory Visit | Attending: Radiology | Admitting: Radiology

## 2022-06-26 ENCOUNTER — Other Ambulatory Visit (HOSPITAL_COMMUNITY): Payer: Self-pay

## 2022-06-26 ENCOUNTER — Encounter: Payer: Self-pay | Admitting: *Deleted

## 2022-06-26 ENCOUNTER — Other Ambulatory Visit (HOSPITAL_COMMUNITY): Payer: Self-pay | Admitting: Interventional Radiology

## 2022-06-26 DIAGNOSIS — T8143XA Infection following a procedure, organ and space surgical site, initial encounter: Secondary | ICD-10-CM

## 2022-06-26 DIAGNOSIS — C182 Malignant neoplasm of ascending colon: Secondary | ICD-10-CM

## 2022-06-26 HISTORY — PX: IR RADIOLOGIST EVAL & MGMT: IMG5224

## 2022-06-26 MED ORDER — IOPAMIDOL (ISOVUE-300) INJECTION 61%
100.0000 mL | Freq: Once | INTRAVENOUS | Status: AC | PRN
  Administered 2022-06-26: 100 mL via INTRAVENOUS

## 2022-06-26 NOTE — Telephone Encounter (Addendum)
Oral Oncology Pharmacist Encounter  Received new prescription for Xeloda (capecitabine) for the treatment of colon cancer, planned duration until disease progression or unacceptable drug toxicity.  CBC w/ Diff and CMP from 06/25/22 assessed, no relevant lab abnormalities noted. Prescription dose and frequency assessed for appropriateness. Per Dr. Burr Medico patient will not start until after next office visit in 2 weeks.   Current medication list in Epic reviewed, DDIs with Xeloda identified: Category D drug-drug interaction between Xeloda and Amiodarone due to risk of Qtc prolongation. Dr. Burr Medico made aware. Office will obtain baseline EKG in 2 weeks at visit and monitor and every 2 cycles thereafter. Category C DDI between Xeloda and Pantoprazole - proton-pump inhibitors may decrease efficacy of Xeloda - will discuss with patient alternatives to pantoprazole, such as H2RA's like famotidine while on Xeloda if patient is able to switch agents.  Evaluated chart and no patient barriers to medication adherence noted.   Patient agreement for treatment documented in MD note on 06/25/22.  Prescription has been e-scribed to the [facility] for benefits analysis and approval.  Oral Oncology Clinic will continue to follow for insurance authorization, copayment issues, initial counseling and start date.  Leron Croak, PharmD, BCPS, [facility] Hematology/Oncology Clinical Pharmacist [place] [place] and [place] [phone] 06/26/2022 8:23 AM

## 2022-06-26 NOTE — Progress Notes (Signed)
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 S/p embolization and drain placement 05/09/22.  Son is helping with drain care.  He completed antibiotics yesterday.  He has 10-20cc OP per day.  He complains of some tenderness at insertion site He presents to drain clinic today for outpatient follow up.   Past Medical History:  Diagnosis Date   AICD (automatic cardioverter/defibrillator) present 2005   CAD (coronary artery disease) 12/01/2013   Chronic combined systolic and diastolic CHF, NYHA class 1 ([place]) 12/01/2013   Erectile dysfunction 12/01/2013   HTN (hypertension) 12/01/2013   Hyperlipidemia 12/01/2013   Ischemic cardiomyopathy 12/01/2013   Presence of permanent cardiac pacemaker    Sleep apnea     Past Surgical History:  Procedure Laterality Date   ACHILLES TENDON REPAIR     lft foot   BIOPSY  04/16/2022   Procedure: BIOPSY;  Surgeon: Yetta Flock, MD;  Location: [place];  Service: Gastroenterology;;   CARDIAC CATHETERIZATION N/A 05/05/2016   Procedure: Left Heart Cath and Coronary Angiography;  Surgeon: Leonie Man, MD;  Location: [place] [facility];  Service: Cardiovascular;  Laterality: N/A;   CARDIAC DEFIBRILLATOR PLACEMENT     CARDIOVERSION N/A 09/13/2020   Procedure: CARDIOVERSION;  Surgeon: Werner Lean, MD;  Location: [place] [facility];  Service: Cardiovascular;  Laterality: N/A;   CARDIOVERSION N/A 09/19/2020   Procedure: CARDIOVERSION;  Surgeon: [name], MD;  Location: [facility];  Service: Cardiovascular;  Laterality: N/A;   COLON RESECTION N/A 04/18/2022   Procedure: HAND ASSISTED LAPAROSCOPIC RIGHT COLON RESECTION;  Surgeon: Felicie Morn, MD;  Location: [place];  Service: General;  Laterality: N/A;   COLONOSCOPY WITH PROPOFOL N/A 04/16/2022   Procedure: COLONOSCOPY WITH PROPOFOL;  Surgeon: Yetta Flock, MD;  Location: [place];  Service: Gastroenterology;  Laterality: N/A;   EP IMPLANTABLE DEVICE N/A 12/19/2016   Procedure: ICD Generator Changeout;  Surgeon: [name], MD;  Location: [place] [facility];  Service: Cardiovascular;  Laterality: N/A;   ESOPHAGOGASTRODUODENOSCOPY N/A 04/16/2022   Procedure: ESOPHAGOGASTRODUODENOSCOPY (EGD);  Surgeon: Yetta Flock, MD;  Location: [facility]COPY;  Service: Gastroenterology;  Laterality: N/A;   IR ANGIOGRAM SELECTIVE EACH ADDITIONAL VESSEL  05/09/2022   IR ANGIOGRAM VISCERAL SELECTIVE  05/09/2022   IR CATHETER TUBE CHANGE  05/14/2022   IR EMBO ART  VEN HEMORR LYMPH EXTRAV  INC GUIDE ROADMAPPING  05/09/2022   IR IMAGE GUIDED DRAINAGE PERCUT CATH  PERITONEAL RETROPERIT  05/09/2022   IR PATIENT EVAL TECH 0-60 MINS  06/11/2022   IR RADIOLOGIST EVAL & MGMT  06/26/2022   IR US GUIDE VASC ACCESS RIGHT  05/09/2022   LEFT HEART CATH AND CORONARY ANGIOGRAPHY N/A 04/17/2022   Procedure: LEFT HEART CATH AND CORONARY ANGIOGRAPHY;  Surgeon: Belva Crome, MD;  Location: [place] [facility];  Service: Cardiovascular;  Laterality: N/A;   POLYPECTOMY  04/16/2022   Procedure: POLYPECTOMY;  Surgeon: Yetta Flock, MD;  Location: [facility] ENDOSCOPY;  Service: Gastroenterology;;   RIGHT/LEFT HEART CATH AND CORONARY ANGIOGRAPHY N/A 09/24/2020   Procedure: RIGHT/LEFT HEART CATH AND CORONARY ANGIOGRAPHY;  Surgeon: Lorretta Harp, MD;  Location: [place] [facility];  Service: Cardiovascular;  Laterality: N/A;   SUBMUCOSAL TATTOO INJECTION  04/16/2022  Procedure: SUBMUCOSAL TATTOO INJECTION;  Surgeon: Yetta Flock, MD;  Location: [facility] ENDOSCOPY;  Service: Gastroenterology;;   WRIST TENODESIS      Allergies: Patient has no known allergies.  Medications: Prior to Admission medications    Medication Sig Start Date End Date Taking? Authorizing Provider  acetaminophen (TYLENOL) 325 MG tablet Take 1-2 tablets (325-650 mg total) by mouth every 4 (four) hours as needed for mild pain. 06/06/22   Setzer, Edman Circle, PA-C  ALPRAZolam Duanne Moron) 0.25 MG tablet Take 1 tablet (0.25 mg total) by mouth at bedtime as needed for anxiety or sleep. 06/25/22   [name], MD  amiodarone (PACERONE) 400 MG tablet Take 1 tablet (400 mg total) by mouth 2 (two) times daily. 06/06/22   Setzer, Edman Circle, PA-C  amoxicillin-clavulanate (AUGMENTIN) 875-125 MG tablet Take 1 tablet by mouth every 12 (twelve) hours. 06/06/22   Setzer, Edman Circle, PA-C  apixaban (ELIQUIS) 5 MG TABS tablet TAKE 1 TABLET(5 MG) BY MOUTH TWICE DAILY 06/06/22   Setzer, Edman Circle, PA-C  atorvastatin (LIPITOR) 80 MG tablet Take 1 tablet (80 mg total) by mouth every evening. 06/06/22   Setzer, Edman Circle, PA-C  capecitabine (XELODA) 500 MG tablet Take 4 tablets every 12 hours for 14 days on and 7 days off, take after a meal. 06/25/22   [name], MD  dapagliflozin propanediol (FARXIGA) 10 MG TABS tablet Take 1 tablet (10 mg total) by mouth daily. 06/06/22   Setzer, Edman Circle, PA-C  diclofenac Sodium (VOLTAREN) 1 % GEL Apply 2 g topically 4 (four) times daily. 06/06/22   Setzer, Edman Circle, PA-C  docusate sodium (COLACE) 100 MG capsule Take 1 capsule (100 mg total) by mouth 2 (two) times daily. 06/06/22   Setzer, Edman Circle, PA-C  ezetimibe (ZETIA) 10 MG tablet Take 1 tablet (10 mg total) by mouth daily. 06/06/22   Setzer, Edman Circle, PA-C  feeding supplement (ENSURE ENLIVE / ENSURE PLUS) LIQD Take 237 mLs by mouth 2 (two) times daily between meals. 06/06/22   Setzer, Edman Circle, PA-C  furosemide (LASIX) 40 MG tablet Take 1 tablet (40 mg total) by mouth daily. 06/06/22   Setzer, Edman Circle, PA-C  levETIRAcetam (KEPPRA) 500 MG tablet Take 1 tablet twice a day 06/20/22   [name], MD  methocarbamol (ROBAXIN) 500 MG tablet Take 1 tablet (500 mg total) by mouth every 6  (six) hours as needed for muscle spasms. 06/06/22   Setzer, Edman Circle, PA-C  metoprolol succinate (TOPROL-XL) 25 MG 24 hr tablet Take 1 tablet (25 mg total) by mouth 2 (two) times daily. 06/06/22   Setzer, Edman Circle, PA-C  mexiletine (MEXITIL) 150 MG capsule Take 1 capsule (150 mg total) by mouth every 8 (eight) hours. 06/06/22   Setzer, Edman Circle, PA-C  nystatin (MYCOSTATIN/NYSTOP) powder Apply topically 2 (two) times daily. Apply To groin area 06/06/22   Setzer, Edman Circle, PA-C  oxyCODONE-acetaminophen (PERCOCET/ROXICET) 5-325 MG tablet Take 1 tablet by mouth every 6 (six) hours as needed for moderate pain or severe pain. 06/06/22   Setzer, Edman Circle, PA-C  pantoprazole (PROTONIX) 40 MG tablet TAKE 1 TABLET(40 MG) BY MOUTH DAILY 06/06/22   Setzer, Edman Circle, PA-C  polyethylene glycol (MIRALAX / GLYCOLAX) 17 g packet Take 17 g by mouth 2 (two) times daily. 06/06/22   Setzer, Edman Circle, PA-C  potassium chloride (KLOR-CON M) 10 MEQ tablet Take 1 tablet (10 mEq total) by mouth daily. 06/06/22   Setzer, Edman Circle, PA-C  sacubitril-valsartan (ENTRESTO) [phone]  MG Take 1 tablet by mouth 2 (two) times daily. 06/06/22   Setzer, Edman Circle, PA-C  sildenafil (VIAGRA) 50 MG tablet Take 1 tablet (50 mg total) by mouth as needed for erectile dysfunction. 01/25/21   Shirley Friar, PA-C  traZODone (DESYREL) 100 MG tablet Take 1 tablet (100 mg total) by mouth at bedtime. 06/20/22   [name], MD     Family History  Problem Relation Age of Onset   Heart disease Mother    Brain cancer Father    Hypertension Daughter     Social History   Socioeconomic History   Marital status: Single    Spouse name: Not on file   Number of children: Not on file   Years of education: Not on file   Highest education level: Not on file  Occupational History   Not on file  Tobacco Use   Smoking status: Former    Packs/day: 1.00    Years: 30.00    Total pack years: 30.00    Types: Cigarettes    Quit date: 05/31/2013    Years  since quitting: 9.0   Smokeless tobacco: Never  Vaping Use   Vaping Use: Never used  Substance and Sexual Activity   Alcohol use: Not Currently    Comment: 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    Right handed   Social Determinants of Health   Financial Resource Strain: High Risk (05/06/2022)   Overall Financial Resource Strain (CARDIA)    Difficulty of Paying Living Expenses: Hard  Food Insecurity: Not on file  Transportation Needs: Not on file  Physical Activity: Not on file  Stress: Not on file  Social Connections: Not on file   Vital Signs: There were no vitals taken for this visit.  Physical Exam Constitutional:      General: He is not in acute distress.    Appearance: He is ill-appearing.  HENT:     Head: Normocephalic and atraumatic.     Mouth/Throat:     Pharynx: Oropharynx is clear.  Eyes:     Extraocular Movements: Extraocular movements intact.  Pulmonary:     Effort: Pulmonary effort is normal. No respiratory distress.  Abdominal:     General: Abdomen is flat.     Palpations: Abdomen is soft.     Tenderness: There is abdominal tenderness.  Skin:    General: Skin is dry.  Neurological:     General: No focal deficit present.     Mental Status: He is alert.  Psychiatric:        Mood and Affect: Mood normal.        Behavior: Behavior normal.   Drain: Mildly retracted, but suture and statlock still in place Less than 10cc brown liquid in bulb Skin intact.  Tenderness at insertion site  Imaging: IR Radiologist Eval & Mgmt  Result Date: 06/26/2022 Please refer to notes tab for details about interventional procedure. (Op Note)  CT ABDOMEN PELVIS W CONTRAST  Result  Date: 06/26/2022 CLINICAL DATA:  61 year old male with a history of abdominal abscess, splenic aneurysm embolization EXAM: CT ABDOMEN AND PELVIS WITH CONTRAST TECHNIQUE: Multidetector CT imaging  of the abdomen and pelvis was performed using the standard protocol following bolus administration of intravenous contrast. RADIATION DOSE REDUCTION: This exam was performed according to the departmental dose-optimization program which includes automated exposure control, adjustment of the mA and/or kV according to patient size and/or use of iterative reconstruction technique. CONTRAST:  175m ISOVUE-300 IOPAMIDOL (ISOVUE-300) INJECTION 61% COMPARISON:  Multiple prior most recent 06/11/2022, 05/12/2022 FINDINGS: Lower chest: Left-sided pleural effusion. Hepatobiliary: Redemonstration of multiple low-density/cystic lesions of the liver. In addition, there are a few right-sided subcentimeter hypodense/hypoenhancing lesions that were not identified on the original CT 04/16/2022, predominantly segment 6, potentially below threshold for identification. These do not appear to have fluid characteristics. Cholelithiasis without inflammatory changes Pancreas: Unremarkable pancreatic parenchyma. Cystic structure on the ventral surface of the pancreatic head measures 2.9 cm, likely resolving pseudo cyst. Spleen: Splenic parenchyma unremarkable with changes in the splenic hilum of prior embolization of pseudoaneurysm Adrenals/Urinary Tract: - Right adrenal gland: Unchanged size of right adrenal nodule, 15 mm, indeterminate. - Left adrenal gland: Unchanged size of left adrenal nodule, 14 mm, indeterminate. - Right kidney: No hydronephrosis, nephrolithiasis, inflammation, or ureteral dilation. Simple cyst on the superior cortex. Additional subcentimeter lesions are indeterminate. - Left Kidney: No hydronephrosis, nephrolithiasis, inflammation, or ureteral dilation. Simple cyst on the superomedial cortex. - Urinary Bladder: Unremarkable  urinary bladder Stomach/Bowel: - Stomach: Unremarkable. - Small bowel: Unremarkable - Appendix: Surgically absent - Colon: Surgical changes of right hemicolectomy and nasty most CIS with unremarkable appearance of the anastomosis in the anterior abdomen. Moderate stool burden of the left colon. Diverticular disease left colon. Vascular/Lymphatic: Atherosclerotic changes of the abdominal aorta. Diameter in the lower abdominal aorta 29 mm. Atherosclerotic changes of celiac artery with the celiac artery patent. Some beading of the celiac artery may represent fibromuscular dysplasia. Hepatic artery and splenic artery patent. Embolization changes of splenic artery aneurysm with no residual flow. Bilateral renal arteries patent. Bilateral iliac arteries and proximal femoral arteries patent. 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 No high-grade stenosis or occlusion. Reproductive: Unremarkable prostate. Other: Unchanged position of drainage catheter in the right abdomen, which is a traditional biliary drain with multiple sideholes. The abscess adjacent to the pigtail loop appears decompressed and evacuated. There is some residual fluid tracking adjacent to the subhepatic segment 6 liver capsule which remains undrained, estimated approximately 4 cm in longitudinal. Musculoskeletal: Degenerative changes spine. No displaced fracture. No aggressive lytic or sclerotic lesions. Degenerative changes bilateral hips. IMPRESSION: Unchanged position of pigtail drainage catheter within the right abdomen. While the deepest and most central component appears resolved, there is residual in the more superficial position. Withdrawal and exchange of the biliary drainage catheter will be performed, with intention of a more superficial multipurpose drain catheter position. There are new hypodense subcentimeter lesions within the right liver, which do not appear cystic and are concerning for liver metastases given the  patient's history. Further evaluation with MRI is recommended. Changes of prior splenic artery aneurysm embolization, without complicating features. Redemonstration of bilateral indeterminate adrenal nodules. These may be assessed on any forthcoming abdominal MR. Abdominal aorta estimated 2.9 cm. Recommend follow-up every 5 years. This recommendation follows ACR consensus guidelines: White Paper of the ACR Incidental Findings Committee II on Vascular Findings. J Am Coll Radiol 2013; 10:789-794. Aortic Atherosclerosis (ICD10-I70.0). Associated mild mesenteric, renal, iliac arterial disease as above. Additional ancillary findings as above. Signed, JDulcy Fanny WNadene Rubins RBath CornerVascular and [facility] Radiology Specialists [facility] Electronically Signed   By: JYork Cerise  Earleen Newport D.O.   On: 06/26/2022 13:42    Labs:  CBC: Recent Labs    05/30/22 0515 06/02/22 0610 06/06/22 0514 06/25/22 1106  WBC 4.8 8.7 9.6 8.2  HGB 10.7* 11.7* 10.9* 13.7  HCT 35.9* 39.1 37.0* 44.1  PLT 308 321 313 280    COAGS: Recent Labs    05/08/22 1637 05/09/22 1325 05/15/22 2250 05/16/22 0619 05/16/22 2053 05/17/22 0708  INR 1.7* 1.6*  --   --   --   --   APTT  --   --  64* 55* 69* 65*    BMP: Recent Labs    05/30/22 0515 06/02/22 0610 06/06/22 0514 06/25/22 1106  NA 135 136 134* 137  K 4.1 4.4 4.4 4.3  CL 99 100 101 102  CO2 '28 27 27 30  '$ GLUCOSE 100* 111* 125* 92  BUN '10 10 11 14  '$ CALCIUM 7.8* 8.1* 8.2* 9.0  CREATININE 0.72 0.96 0.77 0.78  GFRNONAA >60 >60 >60 >60    LIVER FUNCTION TESTS: Recent Labs    05/14/22 0206 05/15/22 0229 05/23/22 0608 06/25/22 1106  BILITOT 0.2* 0.2* 0.2* 0.3  AST 69* 50* 22 39  ALT 55* 47* 24 59*  ALKPHOS 164* 138* 122 157*  PROT 5.6* 5.4* 6.0* 7.8  ALBUMIN <1.5* <1.5* 1.7* 3.7    Assessment:  Intra-abdominal abscess --clinically improving --collection on CT improving, though there is an undrained portion more proximal to the  catheter --arranged appointment at [facility] IR for drain exchange tomorrow at 2pm, with 145 arrival in radiology.  Son and patient express understanding --will need 2 week in clinic follow up appointment for imaging and then IR.  SignedPasty Spillers, PA 06/26/2022, 2:28 PM   Please refer to Dr. Earleen Newport attestation of this note for management and plan.

## 2022-06-27 ENCOUNTER — Encounter: Payer: Commercial Managed Care - HMO | Admitting: Internal Medicine

## 2022-06-27 ENCOUNTER — Other Ambulatory Visit: Payer: Self-pay

## 2022-06-27 ENCOUNTER — Ambulatory Visit (HOSPITAL_COMMUNITY)
Admission: RE | Admit: 2022-06-27 | Discharge: 2022-06-27 | Disposition: A | Discharge status: Home / Self Care | Payer: Commercial Managed Care - HMO | Source: Ambulatory Visit | Attending: Interventional Radiology | Admitting: Interventional Radiology

## 2022-06-27 DIAGNOSIS — T8143XA Infection following a procedure, organ and space surgical site, initial encounter: Secondary | ICD-10-CM | POA: Diagnosis not present

## 2022-06-27 DIAGNOSIS — Z4803 Encounter for change or removal of drains: Secondary | ICD-10-CM | POA: Insufficient documentation

## 2022-06-27 DIAGNOSIS — Y848 Other medical procedures as the cause of abnormal reaction of the patient, or of later complication, without mention of misadventure at the time of the procedure: Secondary | ICD-10-CM | POA: Insufficient documentation

## 2022-06-27 HISTORY — PX: IR CATHETER TUBE CHANGE: IMG717

## 2022-06-27 MED ORDER — LIDOCAINE HCL 1 % IJ SOLN
INTRAMUSCULAR | Status: DC | PRN
  Administered 2022-06-27: 5 mL

## 2022-06-27 MED ORDER — LIDOCAINE HCL 1 % IJ SOLN
INTRAMUSCULAR | Status: AC
  Filled 2022-06-27: qty 20

## 2022-06-27 MED ORDER — IOHEXOL 300 MG/ML  SOLN
100.0000 mL | Freq: Once | INTRAMUSCULAR | Status: AC | PRN
Start: 2022-06-27 — End: 2022-06-27
  Administered 2022-06-27: 10 mL

## 2022-06-27 NOTE — Procedures (Signed)
Interventional Radiology Procedure Note  Procedure: Image guided drain exchange, RUQ.  57F pigtail drain.  Complications: None  EBL: None    Recommendations: - Routine drain care, with sterile flushes, record output  - DC now - routine wound care  Signed,  Dulcy Fanny. Earleen Newport, DO

## 2022-07-01 ENCOUNTER — Other Ambulatory Visit (HOSPITAL_COMMUNITY): Payer: Self-pay

## 2022-07-01 NOTE — Telephone Encounter (Signed)
Oral Oncology Patient Advocate Encounter  Submitted additional clinical question responses to [facility] via fax [phone]  Status is pending.   PA submitted 06/25/2022 Case ID: [id number]  I will continue to check status until final determination.   Lady Deutscher, CPhT-Adv Pharmacy Patient Advocate Specialist [place] Patient Advocate Team Direct Number: [phone]  Fax: [phone]

## 2022-07-02 ENCOUNTER — Other Ambulatory Visit (HOSPITAL_COMMUNITY): Payer: Self-pay

## 2022-07-02 NOTE — Telephone Encounter (Signed)
Oral Oncology Patient Advocate Encounter  Prior Authorization for Capecitabine has been approved.    PA# [id number] Effective dates: 06/29/2022 through 06/30/2023  Patients co-pay is $0.    Lady Deutscher, CPhT-Adv Pharmacy Patient [place] Patient Advocate Team Direct Number: [phone]  Fax: [phone]

## 2022-07-03 ENCOUNTER — Other Ambulatory Visit: Payer: Self-pay | Admitting: Surgery

## 2022-07-03 DIAGNOSIS — T8143XA Infection following a procedure, organ and space surgical site, initial encounter: Secondary | ICD-10-CM

## 2022-07-06 ENCOUNTER — Other Ambulatory Visit: Payer: Self-pay | Admitting: Cardiovascular Disease

## 2022-07-07 ENCOUNTER — Telehealth: Payer: Self-pay | Admitting: Hematology

## 2022-07-07 NOTE — Telephone Encounter (Signed)
Per 7/24 phone line pt called to r/s appointment  date and time picked by pt

## 2022-07-08 ENCOUNTER — Inpatient Hospital Stay: Payer: Commercial Managed Care - HMO

## 2022-07-08 ENCOUNTER — Inpatient Hospital Stay: Payer: Commercial Managed Care - HMO | Admitting: Hematology

## 2022-07-10 ENCOUNTER — Encounter
Payer: Worker's Compensation | Attending: Physical Medicine & Rehabilitation | Admitting: Physical Medicine & Rehabilitation

## 2022-07-13 ENCOUNTER — Other Ambulatory Visit: Payer: Self-pay | Admitting: Physician Assistant

## 2022-07-15 ENCOUNTER — Encounter: Payer: Commercial Managed Care - HMO | Admitting: Internal Medicine

## 2022-07-15 ENCOUNTER — Other Ambulatory Visit: Payer: Self-pay | Admitting: Physician Assistant

## 2022-07-15 NOTE — Telephone Encounter (Signed)
This was a Dr. Posey Pronto patient and not been back. Denied and ask the pharmacy to send to PCP

## 2022-07-16 ENCOUNTER — Ambulatory Visit
Admission: RE | Admit: 2022-07-16 | Discharge: 2022-07-16 | Disposition: A | Discharge status: Home / Self Care | Payer: Commercial Managed Care - HMO | Source: Ambulatory Visit | Attending: Surgery | Admitting: Surgery

## 2022-07-16 ENCOUNTER — Ambulatory Visit: Admission: RE | Admit: 2022-07-16 | Payer: Commercial Managed Care - HMO | Source: Ambulatory Visit

## 2022-07-16 ENCOUNTER — Encounter: Payer: Self-pay | Admitting: *Deleted

## 2022-07-16 DIAGNOSIS — T8143XA Infection following a procedure, organ and space surgical site, initial encounter: Secondary | ICD-10-CM

## 2022-07-16 HISTORY — PX: IR RADIOLOGIST EVAL & MGMT: IMG5224

## 2022-07-16 MED ORDER — IOPAMIDOL (ISOVUE-300) INJECTION 61%
100.0000 mL | Freq: Once | INTRAVENOUS | Status: AC | PRN
  Administered 2022-07-16: 100 mL via INTRAVENOUS

## 2022-07-16 NOTE — Progress Notes (Addendum)
Referring Physician(s): Stechschulte,Paul J  Chief Complaint: The patient is seen in follow up today s/p drain exchange of right abdominal abscess with new 14 French MPD placed 06/27/22. 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 Catheter in place since then. Most recent catheter tube exchange 06/27/22 with Dr Earleen Newport Intra-abdominal abscess --clinically improving --collection on CT improving, though there is an undrained portion more proximal to the catheter --arranged appointment at [facility] IR for drain exchange tomorrow at 2pm, with 145 arrival in radiology.  Son and patient express understanding --will need 2 week in clinic follow up appointment for imaging and then IR  Pt has finished antibiotic OP is minimal: milky color  (usual)~10 cc/24 hrs Continues to flush 10 cc daily Denies fever/chills Denies pain; N/V  See Dr Burr Medico tomorrow for follow up  No follow up with Dr Thermon Leyland that pt knows   Pt to be seen today for CT and evaluation  of collection and drain     Past Medical History:  Diagnosis Date   AICD (automatic cardioverter/defibrillator) present 2005   CAD (coronary artery disease) 12/01/2013   Chronic combined systolic and diastolic CHF, NYHA class 1 ([place]) 12/01/2013   Erectile dysfunction 12/01/2013   HTN (hypertension) 12/01/2013   Hyperlipidemia 12/01/2013   Ischemic cardiomyopathy 12/01/2013   Presence of permanent cardiac pacemaker    Sleep apnea     Past Surgical History:  Procedure Laterality Date   ACHILLES TENDON REPAIR     lft foot   BIOPSY  04/16/2022   Procedure: BIOPSY;  Surgeon: Yetta Flock,  MD;  Location: [place];  Service: Gastroenterology;;   CARDIAC CATHETERIZATION N/A 05/05/2016   Procedure: Left Heart Cath and Coronary Angiography;  Surgeon: Leonie Man, MD;  Location: [place] CV [facility];  Service: Cardiovascular;  Laterality: N/A;   CARDIAC DEFIBRILLATOR PLACEMENT     CARDIOVERSION N/A 09/13/2020   Procedure: CARDIOVERSION;  Surgeon: Werner Lean, MD;  Location: [place] ENDOSCOPY;  Service: Cardiovascular;  Laterality: N/A;   CARDIOVERSION N/A 09/19/2020   Procedure: CARDIOVERSION;  Surgeon: [name], MD;  Location: [facility] ENDOSCOPY;  Service: Cardiovascular;  Laterality: N/A;   COLON RESECTION N/A 04/18/2022   Procedure: HAND ASSISTED LAPAROSCOPIC RIGHT COLON RESECTION;  Surgeon: Felicie Morn, MD;  Location: [place];  Service: General;  Laterality: N/A;   COLONOSCOPY WITH PROPOFOL N/A 04/16/2022   Procedure: COLONOSCOPY WITH PROPOFOL;  Surgeon: Yetta Flock, MD;  Location: [place];  Service: Gastroenterology;  Laterality: N/A;   EP IMPLANTABLE DEVICE N/A 12/19/2016   Procedure: ICD Generator Changeout;  Surgeon: [name], MD;  Location: [place] CV [facility];  Service: Cardiovascular;  Laterality: N/A;   ESOPHAGOGASTRODUODENOSCOPY N/A 04/16/2022   Procedure: ESOPHAGOGASTRODUODENOSCOPY (EGD);  Surgeon: Yetta Flock, MD;  Location: [facility] ENDOSCOPY;  Service: Gastroenterology;  Laterality: N/A;   IR ANGIOGRAM SELECTIVE EACH ADDITIONAL VESSEL  05/09/2022   IR ANGIOGRAM VISCERAL SELECTIVE  05/09/2022   IR CATHETER TUBE CHANGE  05/14/2022   IR CATHETER TUBE CHANGE  06/27/2022   IR EMBO ART  VEN HEMORR LYMPH EXTRAV  INC GUIDE ROADMAPPING  05/09/2022   IR IMAGE GUIDED DRAINAGE PERCUT CATH  PERITONEAL RETROPERIT  05/09/2022  IR PATIENT EVAL TECH 0-60 MINS  06/11/2022   IR RADIOLOGIST EVAL & MGMT  06/26/2022   IR US GUIDE VASC ACCESS RIGHT  05/09/2022   LEFT HEART CATH AND CORONARY ANGIOGRAPHY N/A 04/17/2022   Procedure: LEFT HEART CATH AND CORONARY  ANGIOGRAPHY;  Surgeon: Belva Crome, MD;  Location: [place] [facility];  Service: Cardiovascular;  Laterality: N/A;   POLYPECTOMY  04/16/2022   Procedure: POLYPECTOMY;  Surgeon: Yetta Flock, MD;  Location: [facility] ENDOSCOPY;  Service: Gastroenterology;;   RIGHT/LEFT HEART CATH AND CORONARY ANGIOGRAPHY N/A 09/24/2020   Procedure: RIGHT/LEFT HEART CATH AND CORONARY ANGIOGRAPHY;  Surgeon: Lorretta Harp, MD;  Location: [place] [facility];  Service: Cardiovascular;  Laterality: N/A;   SUBMUCOSAL TATTOO INJECTION  04/16/2022   Procedure: SUBMUCOSAL TATTOO INJECTION;  Surgeon: Yetta Flock, MD;  Location: [facility] ENDOSCOPY;  Service: Gastroenterology;;   WRIST TENODESIS      Allergies: Patient has no known allergies.  Medications: Prior to Admission medications   Medication Sig Start Date End Date Taking? Authorizing Provider  acetaminophen (TYLENOL) 325 MG tablet Take 1-2 tablets (325-650 mg total) by mouth every 4 (four) hours as needed for mild pain. 06/06/22   Setzer, Edman Circle, PA-C  ALPRAZolam Duanne Moron) 0.25 MG tablet Take 1 tablet (0.25 mg total) by mouth at bedtime as needed for anxiety or sleep. 06/25/22   [name], MD  amiodarone (PACERONE) 400 MG tablet Take 1 tablet (400 mg total) by mouth 2 (two) times daily. 06/06/22   Setzer, Edman Circle, PA-C  amoxicillin-clavulanate (AUGMENTIN) 875-125 MG tablet Take 1 tablet by mouth every 12 (twelve) hours. 06/06/22   Setzer, Edman Circle, PA-C  apixaban (ELIQUIS) 5 MG TABS tablet TAKE 1 TABLET(5 MG) BY MOUTH TWICE DAILY 06/06/22   Setzer, Edman Circle, PA-C  atorvastatin (LIPITOR) 80 MG tablet Take 1 tablet (80 mg total) by mouth every evening. 06/06/22   Setzer, Edman Circle, PA-C  capecitabine (XELODA) 500 MG tablet Take 4 tablets every 12 hours for 14 days on and 7 days off, take after a meal. 06/25/22   [name], MD  dapagliflozin propanediol (FARXIGA) 10 MG TABS tablet Take 1 tablet (10 mg total) by mouth daily. 06/06/22   Setzer, Edman Circle, PA-C   diclofenac Sodium (VOLTAREN) 1 % GEL Apply 2 g topically 4 (four) times daily. 06/06/22   Setzer, Edman Circle, PA-C  docusate sodium (COLACE) 100 MG capsule Take 1 capsule (100 mg total) by mouth 2 (two) times daily. 06/06/22   Setzer, Edman Circle, PA-C  ezetimibe (ZETIA) 10 MG tablet Take 1 tablet (10 mg total) by mouth daily. 06/06/22   Setzer, Edman Circle, PA-C  feeding supplement (ENSURE ENLIVE / ENSURE PLUS) LIQD Take 237 mLs by mouth 2 (two) times daily between meals. 06/06/22   Setzer, Edman Circle, PA-C  furosemide (LASIX) 40 MG tablet Take 1 tablet (40 mg total) by mouth daily. 06/06/22   Setzer, Edman Circle, PA-C  levETIRAcetam (KEPPRA) 500 MG tablet Take 1 tablet twice a day 06/20/22   [name], MD  methocarbamol (ROBAXIN) 500 MG tablet Take 1 tablet (500 mg total) by mouth every 6 (six) hours as needed for muscle spasms. 06/06/22   Setzer, Edman Circle, PA-C  metoprolol succinate (TOPROL-XL) 25 MG 24 hr tablet Take 1 tablet (25 mg total) by mouth 2 (two) times daily. 06/06/22   Setzer, Edman Circle, PA-C  mexiletine (MEXITIL) 150 MG capsule Take 1 capsule (150 mg total) by mouth every 8 (eight)  hours. 06/06/22   Setzer, Edman Circle, PA-C  nystatin (MYCOSTATIN/NYSTOP) powder Apply topically 2 (two) times daily. Apply To groin area 06/06/22   Setzer, Edman Circle, PA-C  oxyCODONE-acetaminophen (PERCOCET/ROXICET) 5-325 MG tablet Take 1 tablet by mouth every 6 (six) hours as needed for moderate pain or severe pain. 06/06/22   Setzer, Edman Circle, PA-C  pantoprazole (PROTONIX) 40 MG tablet TAKE 1 TABLET(40 MG) BY MOUTH DAILY 06/06/22   Setzer, Edman Circle, PA-C  polyethylene glycol (MIRALAX / GLYCOLAX) 17 g packet Take 17 g by mouth 2 (two) times daily. 06/06/22   Setzer, Edman Circle, PA-C  potassium chloride (KLOR-CON M) 10 MEQ tablet Take 1 tablet (10 mEq total) by mouth daily. 06/06/22   Setzer, Edman Circle, PA-C  sacubitril-valsartan (ENTRESTO) 49-51 MG Take 1 tablet by mouth 2 (two) times daily. 06/06/22   Setzer, Edman Circle, PA-C   sildenafil (VIAGRA) 50 MG tablet Take 1 tablet (50 mg total) by mouth as needed for erectile dysfunction. 01/25/21   Shirley Friar, PA-C  traZODone (DESYREL) 100 MG tablet Take 1 tablet (100 mg total) by mouth at bedtime. 06/20/22   [name], MD     Family History  Problem Relation Age of Onset   Heart disease Mother    Brain cancer Father    Hypertension Daughter     Social History   Socioeconomic History   Marital status: Single    Spouse name: Not on file   Number of children: Not on file   Years of education: Not on file   Highest education level: Not on file  Occupational History   Not on file  Tobacco Use   Smoking status: Former    Packs/day: 1.00    Years: 30.00    Total pack years: 30.00    Types: Cigarettes    Quit date: 05/31/2013    Years since quitting: 9.1   Smokeless tobacco: Never  Vaping Use   Vaping Use: Never used  Substance and Sexual Activity   Alcohol use: Not Currently    Comment: 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    Right handed   Social Determinants of Health   Financial Resource Strain: High Risk (05/06/2022)   Overall Financial Resource Strain (CARDIA)    Difficulty of Paying Living Expenses: Hard  Food Insecurity: Not on file  Transportation Needs: Not on file  Physical Activity: Not on file  Stress: Not on file  Social Connections: Not on file     Vital Signs: There were no vitals taken for this visit.  Physical Exam Skin:    General: Skin is warm.     Comments: Site is clean and dry NT no bleeding No infection OP is milky color: 10 cc in JP (24 hrs)  CT showing resolution Drain removed in its entirety without  complication per Dr Vernard Gambles order      Imaging: No results found.  Labs:  CBC: Recent Labs    05/30/22 0515 06/02/22 0610 06/06/22 0514 06/25/22 1106  WBC 4.8 8.7 9.6 8.2  HGB 10.7* 11.7* 10.9* 13.7  HCT 35.9* 39.1 37.0* 44.1  PLT 308 321 313 280  COAGS: Recent Labs    05/08/22 1637 05/09/22 1325 05/15/22 2250 05/16/22 0619 05/16/22 2053 05/17/22 0708  INR 1.7* 1.6*  --   --   --   --   APTT  --   --  64* 55* 69* 65*    BMP: Recent Labs    05/30/22 0515 06/02/22 0610 06/06/22 0514 06/25/22 1106  NA 135 136 134* 137  K 4.1 4.4 4.4 4.3  CL 99 100 101 102  CO2 '28 27 27 30  '$ GLUCOSE 100* 111* 125* 92  BUN '10 10 11 14  '$ CALCIUM 7.8* 8.1* 8.2* 9.0  CREATININE 0.72 0.96 0.77 0.78  GFRNONAA >60 >60 >60 >60    LIVER FUNCTION TESTS: Recent Labs    05/14/22 0206 05/15/22 0229 05/23/22 0608 06/25/22 1106  BILITOT 0.2* 0.2* 0.2* 0.3  AST 69* 50* 22 39  ALT 55* 47* 24 59*  ALKPHOS 164* 138* 122 157*  PROT 5.6* 5.4* 6.0* 7.8  ALBUMIN <1.5* <1.5* 1.7* 3.7    Assessment:  CT showing resolution of abscess per Dr Fabio Bering removed today To follow with Dr Burr Medico tomorrow Pt and Dtr aware of plan and have good understanding Follow with [facility] as needed  Signed: Lavonia Drafts, PA-C 07/16/2022, 1:29 PM   Please refer to Dr. Vernard Gambles attestation of this note for management and plan.

## 2022-07-17 ENCOUNTER — Other Ambulatory Visit: Payer: Self-pay

## 2022-07-17 ENCOUNTER — Inpatient Hospital Stay: Payer: Commercial Managed Care - HMO

## 2022-07-17 ENCOUNTER — Inpatient Hospital Stay (HOSPITAL_BASED_OUTPATIENT_CLINIC_OR_DEPARTMENT_OTHER): Payer: Commercial Managed Care - HMO | Admitting: Hematology

## 2022-07-17 ENCOUNTER — Other Ambulatory Visit (HOSPITAL_COMMUNITY): Payer: Self-pay

## 2022-07-17 ENCOUNTER — Inpatient Hospital Stay: Payer: Commercial Managed Care - HMO | Attending: Hematology

## 2022-07-17 ENCOUNTER — Encounter: Payer: Self-pay | Admitting: Hematology

## 2022-07-17 VITALS — BP 166/103 | HR 76 | Temp 97.6°F | Resp 19 | Wt 198.4 lb

## 2022-07-17 DIAGNOSIS — K648 Other hemorrhoids: Secondary | ICD-10-CM | POA: Insufficient documentation

## 2022-07-17 DIAGNOSIS — K8020 Calculus of gallbladder without cholecystitis without obstruction: Secondary | ICD-10-CM | POA: Diagnosis not present

## 2022-07-17 DIAGNOSIS — C182 Malignant neoplasm of ascending colon: Secondary | ICD-10-CM | POA: Diagnosis not present

## 2022-07-17 DIAGNOSIS — D124 Benign neoplasm of descending colon: Secondary | ICD-10-CM | POA: Diagnosis not present

## 2022-07-17 DIAGNOSIS — M1612 Unilateral primary osteoarthritis, left hip: Secondary | ICD-10-CM | POA: Insufficient documentation

## 2022-07-17 DIAGNOSIS — N281 Cyst of kidney, acquired: Secondary | ICD-10-CM | POA: Diagnosis not present

## 2022-07-17 DIAGNOSIS — D509 Iron deficiency anemia, unspecified: Secondary | ICD-10-CM

## 2022-07-17 DIAGNOSIS — I2510 Atherosclerotic heart disease of native coronary artery without angina pectoris: Secondary | ICD-10-CM | POA: Insufficient documentation

## 2022-07-17 DIAGNOSIS — D500 Iron deficiency anemia secondary to blood loss (chronic): Secondary | ICD-10-CM | POA: Diagnosis not present

## 2022-07-17 DIAGNOSIS — I700 Atherosclerosis of aorta: Secondary | ICD-10-CM | POA: Diagnosis not present

## 2022-07-17 DIAGNOSIS — Z7901 Long term (current) use of anticoagulants: Secondary | ICD-10-CM | POA: Diagnosis not present

## 2022-07-17 DIAGNOSIS — E278 Other specified disorders of adrenal gland: Secondary | ICD-10-CM | POA: Insufficient documentation

## 2022-07-17 DIAGNOSIS — I509 Heart failure, unspecified: Secondary | ICD-10-CM | POA: Insufficient documentation

## 2022-07-17 DIAGNOSIS — D125 Benign neoplasm of sigmoid colon: Secondary | ICD-10-CM | POA: Insufficient documentation

## 2022-07-17 DIAGNOSIS — J9811 Atelectasis: Secondary | ICD-10-CM | POA: Diagnosis not present

## 2022-07-17 DIAGNOSIS — Z79899 Other long term (current) drug therapy: Secondary | ICD-10-CM | POA: Diagnosis not present

## 2022-07-17 DIAGNOSIS — I4891 Unspecified atrial fibrillation: Secondary | ICD-10-CM | POA: Insufficient documentation

## 2022-07-17 DIAGNOSIS — C787 Secondary malignant neoplasm of liver and intrahepatic bile duct: Secondary | ICD-10-CM | POA: Diagnosis not present

## 2022-07-17 DIAGNOSIS — K922 Gastrointestinal hemorrhage, unspecified: Secondary | ICD-10-CM | POA: Insufficient documentation

## 2022-07-17 DIAGNOSIS — J90 Pleural effusion, not elsewhere classified: Secondary | ICD-10-CM | POA: Diagnosis not present

## 2022-07-17 DIAGNOSIS — I7143 Infrarenal abdominal aortic aneurysm, without rupture: Secondary | ICD-10-CM | POA: Insufficient documentation

## 2022-07-17 DIAGNOSIS — Z9049 Acquired absence of other specified parts of digestive tract: Secondary | ICD-10-CM | POA: Insufficient documentation

## 2022-07-17 DIAGNOSIS — K8689 Other specified diseases of pancreas: Secondary | ICD-10-CM | POA: Diagnosis not present

## 2022-07-17 DIAGNOSIS — I110 Hypertensive heart disease with heart failure: Secondary | ICD-10-CM | POA: Diagnosis not present

## 2022-07-17 DIAGNOSIS — D123 Benign neoplasm of transverse colon: Secondary | ICD-10-CM | POA: Insufficient documentation

## 2022-07-17 LAB — IRON AND IRON BINDING CAPACITY (CC-WL,HP ONLY)
Iron: 49 ug/dL (ref 45–182)
Saturation Ratios: 15 % — ABNORMAL LOW (ref 17.9–39.5)
TIBC: 329 ug/dL (ref 250–450)
UIBC: 280 ug/dL (ref 117–376)

## 2022-07-17 LAB — COMPREHENSIVE METABOLIC PANEL
ALT: 52 U/L — ABNORMAL HIGH (ref 0–44)
AST: 27 U/L (ref 15–41)
Albumin: 3.8 g/dL (ref 3.5–5.0)
Alkaline Phosphatase: 158 U/L — ABNORMAL HIGH (ref 38–126)
Anion gap: 5 (ref 5–15)
BUN: 17 mg/dL (ref 8–23)
CO2: 30 mmol/L (ref 22–32)
Calcium: 8.6 mg/dL — ABNORMAL LOW (ref 8.9–10.3)
Chloride: 102 mmol/L (ref 98–111)
Creatinine, Ser: 0.85 mg/dL (ref 0.61–1.24)
GFR, Estimated: >60 mL/min (ref >60)
Glucose, Bld: 100 mg/dL — ABNORMAL HIGH (ref 70–99)
Potassium: 4.3 mmol/L (ref 3.5–5.1)
Sodium: 137 mmol/L (ref 135–145)
Total Bilirubin: 0.3 mg/dL (ref 0.3–1.2)
Total Protein: 7.3 g/dL (ref 6.5–8.1)

## 2022-07-17 LAB — CBC WITH DIFFERENTIAL/PLATELET
Abs Immature Granulocytes: 0.01 10*3/uL (ref 0.00–0.07)
Basophils Absolute: 0 10*3/uL (ref 0.0–0.1)
Basophils Relative: 0 %
Eosinophils Absolute: 0.2 10*3/uL (ref 0.0–0.5)
Eosinophils Relative: 3 %
HCT: 45.4 % (ref 39.0–52.0)
Hemoglobin: 14.5 g/dL (ref 13.0–17.0)
Immature Granulocytes: 0 %
Lymphocytes Relative: 14 %
Lymphs Abs: 1 10*3/uL (ref 0.7–4.0)
MCH: 28.8 pg (ref 26.0–34.0)
MCHC: 31.9 g/dL (ref 30.0–36.0)
MCV: 90.3 fL (ref 80.0–100.0)
Monocytes Absolute: 0.6 10*3/uL (ref 0.1–1.0)
Monocytes Relative: 8 %
Neutro Abs: 5.1 10*3/uL (ref 1.7–7.7)
Neutrophils Relative %: 75 %
Platelets: 203 10*3/uL (ref 150–400)
RBC: 5.03 MIL/uL (ref 4.22–5.81)
RDW: 18.2 % — ABNORMAL HIGH (ref 11.5–15.5)
WBC: 6.9 10*3/uL (ref 4.0–10.5)
nRBC: 0 % (ref 0.0–0.2)

## 2022-07-17 LAB — FERRITIN: Ferritin: 22 ng/mL — ABNORMAL LOW (ref 24–336)

## 2022-07-17 LAB — CEA (IN HOUSE-CHCC): CEA (CHCC-In House): 3.79 ng/mL (ref 0.00–5.00)

## 2022-07-17 NOTE — Telephone Encounter (Signed)
Oral Chemotherapy Pharmacist Encounter  I met with patient and patient's son for overview of: Xeloda (capecitabine) for the treatment of colon cancer, planned duration until disease progression or unacceptable drug toxicity.  Counseled patient on administration, dosing, side effects, monitoring, drug-food interactions, safe handling, storage, and disposal.  Patient will take Xeloda 543m tablets, 4 tablets (20081m by mouth in AM and 4 tabs (200053mby mouth in PM, within 30 minutes of finishing meals, for 14 days on, 7 days off, repeated every 21 days.  Xeloda start date: 07/21/22  Adverse effects include but are not limited to: fatigue, decreased blood counts, GI upset, diarrhea, mouth sores, and hand-foot syndrome. Patient will obtain anti diarrheal and alert the office of 4 or more loose stools above baseline.  Reviewed with patient importance of keeping a medication schedule and plan for any missed doses. No barriers to medication adherence identified.  Medication reconciliation performed and medication/allergy list updated.  Discussed drug-drug interactions with patient: Patient agreeable to discontinue pantoprazole while on Xeloda and utilize Pepcid (famotidine) or Tums while on Xeloda due to possible risk of decrease efficacy of Xeloda when used in combination with PPI. Patient informed of increased risk of Qtc prolongation with concomitant use of amiodarone and Xeloda. Patient will have baseline EKG done in office 07/17/22 and then will be monitored closely after initiation and while on therapy thereafter.  Patient will pick this up from the [facility] 07/17/22.  Patient informed the pharmacy will reach out 5-7 days prior to needing next fill of Xeloda to coordinate continued medication acquisition to prevent break in therapy.  All questions answered.  Mr. WilAlbroiced understanding and appreciation.   Medication education handout and calendar given to  patient. Patient knows to call the office with questions or concerns. Oral Chemotherapy Clinic phone number provided to patient.   RebLeron CroakharmD, BCPS, BCOP Hematology/Oncology Clinical Pharmacist [phone]/2023 9:22 AM

## 2022-07-17 NOTE — Progress Notes (Signed)
This nurse scheduled patients PET scan and provided appointment details and prep instructions for the scan to patients son.  No questions or concerns noted at this time.

## 2022-07-17 NOTE — Progress Notes (Addendum)
[place]   Telephone:([phone]) [phone] Fax:(336) [phone]   Clinic Follow up Note   Patient Care Team: Patient, No Pcp Per as PCP - General (General Practice) Croitoru, Dani Gobble, MD as PCP - Cardiology (Cardiology) [name], MD as PCP - Electrophysiology (Cardiology) [name], MD as Consulting Physician (Neurology) [name], MD as Consulting Physician (Hematology) Stechschulte, Nickola Major, MD as Consulting Physician (Surgery)  Date of Service:  07/17/2022  CHIEF COMPLAINT: f/u of colon cancer  CURRENT THERAPY:  To start Xeloda, q21d, on 07/21/2022  -dose: 2021m BID on days 1-14  ASSESSMENT & PLAN:  Gregory [name] a 61y.o. male with   1. 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 FOBT test positive. Colonoscopy on 04/16/22 with Dr. AHavery Morosconfirmed adenocarcinoma. -s/p resection on 04/18/22 by Dr. SThermon Leyland path showed: 6.4 cm moderate to poorly differentiated adenocarcinoma; 11/35 nodes were positive; lymphovascular space invasion present; margins negative. MMR normal. Of note, appendix was uninvolved.  -he developed intra-abdominal abscess, which required draining placement on 05/09/22. -most recent CT AP on 07/16/22 showed: virtually complete resolution of abscess cavity; multiple liver lesions measuring up to 1 cm; resolving peripancreatic fluid collections/hematomas. Draining tube was removed that day. -I reviewed the recent two CT scan results with them today. I explained that the new liver lesions are concerning for metastatic disease. They are too small to biopsy. I recommend further evaluation with PET scan, which was previously ordered. -I again discussed starting treatment with oral Xeloda, now that his draining tube has been removed. We again reviewed side  effects and managment, and he met with oral pharmacist RWells Guilesfor further discussion. -labs reviewed, overall stable to improved. -I have requested FO on his surgical path    2. Uncontrolled hypertension -chart review shows overall normal BP. However, BP has been up in the last month-- 143/95 on 06/25/22 and 166/103 today (07/17/22). He is asymptomatic -he tells me he plans to [place] cardi[place] -will contact his cardiologist   3. Anemia from GI bleeding and iron deficiency -secondary to #1 -he received IV Feraheme and blood transfusion during hospitalization -labs today (8/3) show resolution of anemia, hgb up to 14.5. Iron panel and ferritin are pending.   4. CAD, A-fib, CHF with EF 20%, status post AICD placement -s/p left heart cath 05/18/22 -continue medications and f/u per cardiology    PLAN: -plan to start Xeloda, [id number] for day 1-14 every 21 days, on 8/7 -EKG done today, and will monitor on next visit due to DDI with amiodarone (QT provocation) -We will reach out to his cardiologist Dr. KlCaryl Comesor his close follow-up during chemo  -lab and f/u in 3 weeks with PET before next visit    No problem-specific Assessment & Plan notes found for this encounter.   SUMMARY OF ONCOLOGIC HISTORY: Oncology History Overview Note   Cancer Staging  [facility]: Colon and Rectum, AJCC 8th Edition - Pathologic stage from 04/22/2022: pT3, pN2b, cM1 - Signed by [name] on 05/05/2022    [place] ([place] 04/12/2022 Miscellaneous   Fecal Occult Bld - POSITIVE   04/16/2022 Imaging   EXAM: CT CHEST, ABDOMEN, AND PELVIS WITH CONTRAST  IMPRESSION: 1. Infiltrative mass in the ascending colon concerning for primary  colonic neoplasm, estimated to measure approximately 6.5 x 4.7 x 7.4 cm, with evidence of local infiltration of disease in the pericolonic space adjacent to the lesion, as well as pathologically enlarged ileocolic  and retroperitoneal lymphadenopathy, concerning for nodal metastasis. 2. Bilateral adrenal nodules which are indeterminate, but potentially metastatic. 3. Multiple hypovascular lesions scattered throughout the pancreas. The possibility of metastatic disease to the pancreas should be considered. Alternatively, benign etiologies such as chronic pancreatic pseudocysts could have a similar appearance (no recent prior studies are available for comparison). Close attention on follow-up studies is recommended to ensure the stability or regression of these pancreatic lesions. 4. Small to moderate left pleural effusion lying dependently with some passive subsegmental atelectasis in the left lower lobe. 5. Cardiomegaly with left atrial dilatation. 6. Aortic atherosclerosis, in addition to left main and three-vessel coronary artery disease. Please note that although the presence of coronary artery calcium documents the presence of coronary artery disease, the severity of this disease and any potential stenosis cannot be assessed on this non-gated CT examination. Assessment for potential risk factor modification, dietary therapy or pharmacologic therapy may be warranted, if clinically indicated. 7. There are calcifications of the aortic valve. Echocardiographic correlation for evaluation of potential valvular dysfunction may be warranted if clinically indicated. 8. Additional incidental findings, as above.   04/16/2022 Procedure   Colonoscopy; Dr. Havery Moros  Impression: -Stool in the entire examined colon, several minutes spent lavaging the colon - Likely malignant partially obstructing tumor in the suspected proximal transverse colon vs. right colon as above - could not traverse it. Biopsied. Tattooed. - Five 3 to 10 mm polyps in the transverse colon, removed with a cold snare. Resected and retrieved. - Two 6 to 8 mm polyps in the transverse colon, removed with a hot snare. Resected and retrieved. - One  6 mm polyp in the descending colon, removed with a cold snare. Resected and retrieved. - Two 3 to 4 mm polyps in the sigmoid colon, removed with a cold snare. Resected and retrieved. - One 15 mm polyp in the sigmoid colon, removed with a hot snare. Resected and retrieved. - One 3 to 4 mm polyp at the recto-sigmoid colon, removed with a cold snare. Resected and retrieved. - Internal hemorrhoids. - The examination was otherwise normal.  Colon mass is likely malignant and the cause of anemia / bleeding.   04/16/2022 Initial Biopsy   FINAL MICROSCOPIC DIAGNOSIS:   A. COLON MASS, BIOPSY:  - Moderately differentiated colonic adenocarcinoma.  - See comment.   B. COLON, TRANSVERSE, DESCENDING AND SIGMOID, POLYPECTOMY:  - Tubular adenoma (s) without high grade dysplasia.  - Sessile serrated adenoma(s) without cytologic dysplasia.   C. COLON, SIGMOID, POLYPECTOMY:  - Tubular adenoma without high grade dysplasia.    04/17/2022 Initial Diagnosis   Cancer of right colon ([place])   04/18/2022 Definitive Surgery   FINAL MICROSCOPIC DIAGNOSIS:   A. COLON, RIGHT, RESECTION:  -  Adenocarcinoma, moderate to poorly differentiated, 6.4 cm  -  Metastatic adenocarcinoma involving eleven of thirty-five lymph nodes (11/35)  -  Lymphovascular space invasion present  -  Margins uninvolved by carcinoma  -  Multiple tubular adenomas  -  Appendix uninvolved by adenocarcinoma  -  See oncology table and comment below   ADDENDUM:  Mismatch Repair Protein (IHC)  SUMMARY INTERPRETATION: NORMAL   04/22/2022 Cancer Staging   Staging form: Colon and Rectum, AJCC 8th Edition - Pathologic stage from 04/22/2022: pT3, pN2b, cM1 - Signed by [name], MD on  05/05/2022 Stage prefix: Initial diagnosis Histologic grading system: 4 grade system Histologic grade (G): G3 Residual tumor (R): R0 - None      INTERVAL HISTORY:  Gregory [name] is here for a follow up of colon cancer. He was last seen by me on 06/25/22.  He presents to the clinic alone. He reports some pain to the drain tube site from removal yesterday. He notes he has gained some weight back.   All other systems were reviewed with the patient and are negative.  MEDICAL HISTORY:  Past Medical History:  Diagnosis Date   AICD (automatic cardioverter/defibrillator) present 2005   CAD (coronary artery disease) 12/01/2013   Chronic combined systolic and diastolic CHF, NYHA class 1 ([place]) 12/01/2013   Erectile dysfunction 12/01/2013   HTN (hypertension) 12/01/2013   Hyperlipidemia 12/01/2013   Ischemic cardiomyopathy 12/01/2013   Presence of permanent cardiac pacemaker    Sleep apnea     SURGICAL HISTORY: Past Surgical History:  Procedure Laterality Date   ACHILLES TENDON REPAIR     lft foot   BIOPSY  04/16/2022   Procedure: BIOPSY;  Surgeon: Yetta Flock, MD;  Location: [facility];  Service: Gastroenterology;;   CARDIAC CATHETERIZATION N/A 05/05/2016   Procedure: Left Heart Cath and Coronary Angiography;  Surgeon: Leonie Man, MD;  Location: [facility];  Service: Cardiovascular;  Laterality: N/A;   CARDIAC DEFIBRILLATOR PLACEMENT     CARDIOVERSION N/A 09/13/2020   Procedure: CARDIOVERSION;  Surgeon: Werner Lean, MD;  Location: [facility];  Service: Cardiovascular;  Laterality: N/A;   CARDIOVERSION N/A 09/19/2020   Procedure: CARDIOVERSION;  Surgeon: [name], MD;  Location: [facility];  Service: Cardiovascular;  Laterality: N/A;   COLON RESECTION N/A 04/18/2022   Procedure: HAND ASSISTED LAPAROSCOPIC RIGHT COLON RESECTION;  Surgeon: Felicie Morn, MD;  Location: [facility];  Service: General;  Laterality: N/A;   COLONOSCOPY WITH PROPOFOL N/A 04/16/2022   Procedure: COLONOSCOPY WITH PROPOFOL;  Surgeon: Yetta Flock, MD;  Location: [facility];  Service: Gastroenterology;  Laterality: N/A;   EP IMPLANTABLE DEVICE N/A 12/19/2016   Procedure: ICD Generator Changeout;  Surgeon: [name],  MD;  Location: [facility];  Service: Cardiovascular;  Laterality: N/A;   ESOPHAGOGASTRODUODENOSCOPY N/A 04/16/2022   Procedure: ESOPHAGOGASTRODUODENOSCOPY (EGD);  Surgeon: Yetta Flock, MD;  [facility];  Service: Gastroenterology;  Laterality: N/A;   IR ANGIOGRAM SELECTIVE EACH ADDITIONAL VESSEL  05/09/2022   IR ANGIOGRAM VISCERAL SELECTIVE  05/09/2022   IR CATHETER TUBE CHANGE  05/14/2022   IR CATHETER TUBE CHANGE  06/27/2022   IR EMBO ART  VEN HEMORR LYMPH EXTRAV  INC GUIDE ROADMAPPING  05/09/2022   IR IMAGE GUIDED DRAINAGE PERCUT CATH  PERITONEAL RETROPERIT  05/09/2022   IR PATIENT EVAL TECH 0-60 MINS  06/11/2022   IR RADIOLOGIST EVAL & MGMT  06/26/2022   IR RADIOLOGIST EVAL & MGMT  07/16/2022   IR US GUIDE VASC ACCESS RIGHT  05/09/2022   LEFT HEART CATH AND CORONARY ANGIOGRAPHY N/A 04/17/2022   Procedure: LEFT HEART CATH AND CORONARY ANGIOGRAPHY;  Surgeon: Belva Crome, MD;  Location: [facility];  Service: Cardiovascular;  Laterality: N/A;   POLYPECTOMY  04/16/2022   Procedure: POLYPECTOMY;  Surgeon: Yetta Flock, MD;  Location: [facility] END[facility];  Service: Gastroenterology;;   RIGHT/LEFT HEART CATH AND CORONARY ANGIOGRAPHY N/A 09/24/2020   Procedure: RIGHT/LEFT HEART CATH AND CORONARY ANGIOGRAPHY;  Surgeon: Lorretta Harp, MD;  Location: [facility] [place]  CV LAB;  Service: Cardiovascular;  Laterality: N/A;   SUBMUCOSAL TATTOO INJECTION  04/16/2022   Procedure: SUBMUCOSAL TATTOO INJECTION;  Surgeon: Yetta Flock, MD;  Location: [facility] ENDOSCOPY;  Service: Gastroenterology;;   WRIST TENODESIS      I have reviewed the social history and family history with the patient and they are unchanged from previous note.  ALLERGIES:  has No Known Allergies.  MEDICATIONS:  Current Outpatient Medications  Medication Sig Dispense Refill   acetaminophen (TYLENOL) 325 MG tablet Take 1-2 tablets (325-650 mg total) by mouth every 4 (four) hours as needed for mild pain.      ALPRAZolam (XANAX) 0.25 MG tablet Take 1 tablet (0.25 mg total) by mouth at bedtime as needed for anxiety or sleep. 30 tablet 0   amiodarone (PACERONE) 400 MG tablet Take 1 tablet (400 mg total) by mouth 2 (two) times daily. 60 tablet 0   apixaban (ELIQUIS) 5 MG TABS tablet TAKE 1 TABLET(5 MG) BY MOUTH TWICE DAILY 180 tablet 1   atorvastatin (LIPITOR) 80 MG tablet Take 1 tablet (80 mg total) by mouth every evening. 90 tablet 3   capecitabine (XELODA) 500 MG tablet Take 4 tablets every 12 hours for 14 days on and 7 days off, take after a meal. 112 tablet 0   dapagliflozin propanediol (FARXIGA) 10 MG TABS tablet Take 1 tablet (10 mg total) by mouth daily. 90 tablet 3   diclofenac Sodium (VOLTAREN) 1 % GEL Apply 2 g topically 4 (four) times daily.     docusate sodium (COLACE) 100 MG capsule Take 1 capsule (100 mg total) by mouth 2 (two) times daily. 10 capsule 0   ezetimibe (ZETIA) 10 MG tablet Take 1 tablet (10 mg total) by mouth daily. 30 tablet 0   feeding supplement (ENSURE ENLIVE / ENSURE PLUS) LIQD Take 237 mLs by mouth 2 (two) times daily between meals. 237 mL 12   furosemide (LASIX) 40 MG tablet Take 1 tablet (40 mg total) by mouth daily. 30 tablet 0   levETIRAcetam (KEPPRA) 500 MG tablet Take 1 tablet twice a day 180 tablet 3   methocarbamol (ROBAXIN) 500 MG tablet Take 1 tablet (500 mg total) by mouth every 6 (six) hours as needed for muscle spasms. 30 tablet 0   metoprolol succinate (TOPROL-XL) 25 MG 24 hr tablet Take 1 tablet (25 mg total) by mouth 2 (two) times daily. 60 tablet 0   mexiletine (MEXITIL) 150 MG capsule Take 1 capsule (150 mg total) by mouth every 8 (eight) hours. 30 capsule 0   nystatin (MYCOSTATIN/NYSTOP) powder Apply topically 2 (two) times daily. Apply To groin area 15 g 0   oxyCODONE-acetaminophen (PERCOCET/ROXICET) 5-325 MG tablet Take 1 tablet by mouth every 6 (six) hours as needed for moderate pain or severe pain. 21 tablet 0   polyethylene glycol (MIRALAX / GLYCOLAX)  17 g packet Take 17 g by mouth 2 (two) times daily. 14 each 0   potassium chloride (KLOR-CON M) 10 MEQ tablet Take 1 tablet (10 mEq total) by mouth daily. 30 tablet 6   sacubitril-valsartan (ENTRESTO) 49-51 MG Take 1 tablet by mouth 2 (two) times daily. 180 tablet 1   sildenafil (VIAGRA) 50 MG tablet Take 1 tablet (50 mg total) by mouth as needed for erectile dysfunction. 30 tablet 4   traZODone (DESYREL) 100 MG tablet Take 1 tablet (100 mg total) by mouth at bedtime. 90 tablet 3   No current facility-administered medications for this visit.    PHYSICAL EXAMINATION:  ECOG PERFORMANCE STATUS: 2 - Symptomatic, <50% confined to bed  Vitals:   07/17/22 0846  BP: (!) 166/103  Pulse: 76  Resp: 19  Temp: 97.6 F (36.4 C)  SpO2: 98%   Wt Readings from Last 3 Encounters:  07/17/22 198 lb 7 oz (90 kg)  06/25/22 191 lb 11.2 oz (87 kg)  06/20/22 212 lb (96.2 kg)     GENERAL:alert, no distress and comfortable SKIN: skin color normal, no rashes or significant lesions EYES: normal, Conjunctiva are pink and non-injected, sclera clear  NEURO: alert & oriented x 3 with fluent speech  LABORATORY DATA:  I have reviewed the data as listed    Latest Ref Rng & Units 07/17/2022    8:15 AM 06/25/2022   11:06 AM 06/06/2022    5:14 AM  CBC  WBC 4.0 - 10.5 K/uL 6.9  8.2  9.6   Hemoglobin 13.0 - 17.0 g/dL 14.5  13.7  10.9   Hematocrit 39.0 - 52.0 % 45.4  44.1  37.0   Platelets 150 - 400 K/uL 203  280  313         Latest Ref Rng & Units 07/17/2022    8:15 AM 06/25/2022   11:06 AM 06/06/2022    5:14 AM  CMP  Glucose 70 - 99 mg/dL 100  92  125   BUN 8 - 23 mg/dL '17  14  11   ' Creatinine 0.61 - 1.24 mg/dL 0.85  0.78  0.77   Sodium 135 - 145 mmol/L 137  137  134   Potassium 3.5 - 5.1 mmol/L 4.3  4.3  4.4   Chloride 98 - 111 mmol/L 102  102  101   CO2 22 - 32 mmol/L '30  30  27   ' Calcium 8.9 - 10.3 mg/dL 8.6  9.0  8.2   Total Protein 6.5 - 8.1 g/dL 7.3  7.8    Total Bilirubin 0.3 - 1.2 mg/dL 0.3  0.3     Alkaline Phos 38 - 126 U/L 158  157    AST 15 - 41 U/L 27  39    ALT 0 - 44 U/L 52  59        RADIOGRAPHIC STUDIES: I have personally reviewed the radiological images as listed and agreed with the findings in the report. IR Radiologist Eval & Mgmt  Result Date: 07/16/2022 Please refer to notes tab for details about interventional procedure. (Op Note)  CT ABDOMEN PELVIS W CONTRAST  Result Date: 07/16/2022 CLINICAL DATA:  Post partial colectomy, postop abscess, status post percutaneous drain catheter placement 05/09/2022, catheter exchange and revision 05/14/2022, catheter exchange and revision 06/27/2022, minimal output recently EXAM: CT ABDOMEN AND PELVIS WITH CONTRAST TECHNIQUE: Multidetector CT imaging of the abdomen and pelvis was performed using the standard protocol following bolus administration of intravenous contrast. RADIATION DOSE REDUCTION: This exam was performed according to the departmental dose-optimization program which includes automated exposure control, adjustment of the mA and/or kV according to patient size and/or use of iterative reconstruction technique. CONTRAST:  171m ISOVUE-300 IOPAMIDOL (ISOVUE-300) INJECTION 61% COMPARISON:  06/26/2022 and previous FINDINGS: Lower chest: Stable small left pleural effusion. Adjacent atelectasis/consolidation in the dependent aspect of the left lung base. Transvenous pacing leads. Coronary calcifications. Hepatobiliary: Multiple small scattered low-attenuation liver lesions measure up to 1 cm diameter, not convincingly changed from previous study. No new liver lesion or biliary ductal dilatation. 7 mm gallstone in the dependent aspect of the nondilated gallbladder. Pancreas: No ductal dilatation or adjacent  inflammatory change. Resolving peripancreatic fluid collections including a 3.4 cm collection anterior to the body, 5.5 cm bilobed collection anterior to the tail, and a 5.5 cm bilobed collection interposed between the pancreatic tail  and spleen with regional embolization coils again noted. Spleen: Normal in size.  No focal lesion. Adrenals/Urinary Tract: Stable hyperdense/enhancing adrenal nodules 1.8 cm right and 1.7 cm left. Stable bilateral upper lobe renal cysts; no follow-up required. No enhancing renal mass or hydronephrosis. Urinary bladder incompletely distended. Stomach/Bowel: Stomach is nondistended. Small bowel decompressed. Partial right colectomy. Staple line proximal transverse colon. Remaining colon is incompletely distended, unremarkable. Vascular/Lymphatic: Extensive scattered aortoiliac calcified plaque. 3.1 cm fusiform infrarenal aortic aneurysm. Prominent subcentimeter left para-aortic and aortocaval lymph nodes as before. No pelvic or mesenteric adenopathy. Reproductive: Prostate is unremarkable. Other: No ascites. No free air. Stable pigtail drain catheter inferior to the right lobe of the liver, lateral to the gallbladder, with near complete evacuation of abscess cavity. No undrained residual or recurrent component. Musculoskeletal: Advanced left hip DJD. Spondylitic changes in the lower thoracic and lumbar spine. No acute findings. IMPRESSION: 1. Virtually complete resolution of right intrahepatic abscess cavity post revision of drain catheter. 2. Multiple liver lesions measure up to 1 cm as before suggesting hepatic metastatic disease. Follow-up recommended. 3. Resolving peripancreatic fluid collections/hematomas. 4. Stable nonspecific hyperdense/enhancing adrenal nodules. 5. Aortic Atherosclerosis (ICD10-170.0). 6. 3.1 cm infrarenal abdominal aortic aneurysm. Recommend follow-up every 3 years. Reference: J Am Coll Radiol 1558;28:332-334. Electronically Signed   By: Lucrezia Europe M.D.   On: 07/16/2022 15:34      No orders of the defined types were placed in this encounter.  All questions were answered. The patient knows to call the clinic with any problems, questions or concerns. No barriers to learning was  detected. The total time spent in the appointment was 40 minutes.     [name], MD 07/17/2022   I, Wilburn Mylar, am acting as scribe for [name], MD.   I have reviewed the above documentation for accuracy and completeness, and I agree with the above.

## 2022-07-18 ENCOUNTER — Telehealth: Payer: Self-pay | Admitting: Hematology

## 2022-07-18 ENCOUNTER — Encounter: Payer: Self-pay | Admitting: Internal Medicine

## 2022-07-18 NOTE — Telephone Encounter (Signed)
Scheduled follow-up appointment per 8/3 los. Patient is aware.

## 2022-07-21 NOTE — Telephone Encounter (Signed)
Yes, I would just leave the meds as they are for now. Call back if high BP is freuent. Remember to always check BP after resting in a seated position for at least 10 minutes, full relaxed

## 2022-07-22 MED ORDER — METOPROLOL SUCCINATE ER 50 MG PO TB24: 50.0000 mg | ORAL_TABLET | Freq: Two times a day (BID) | ORAL | 3 refills | Status: DC

## 2022-07-22 NOTE — Telephone Encounter (Signed)
Please go ahead and increase the metoprolol to 50 mg twice a day and give [place] feedback on BP on Thursday-Friday of this week

## 2022-07-24 ENCOUNTER — Encounter (HOSPITAL_COMMUNITY)
Admission: RE | Admit: 2022-07-24 | Discharge: 2022-07-24 | Disposition: A | Discharge status: Home / Self Care | Payer: Commercial Managed Care - HMO | Source: Ambulatory Visit | Attending: Hematology | Admitting: Hematology

## 2022-07-24 DIAGNOSIS — C182 Malignant neoplasm of ascending colon: Secondary | ICD-10-CM | POA: Insufficient documentation

## 2022-07-24 LAB — GLUCOSE, CAPILLARY: Glucose-Capillary: 106 mg/dL — ABNORMAL HIGH (ref 70–99)

## 2022-07-24 MED ORDER — FLUDEOXYGLUCOSE F - 18 (FDG) INJECTION
9.9000 | Freq: Once | INTRAVENOUS | Status: AC
  Administered 2022-07-24: 9.8 via INTRAVENOUS

## 2022-07-27 ENCOUNTER — Other Ambulatory Visit: Payer: Self-pay | Admitting: Cardiovascular Disease

## 2022-07-28 ENCOUNTER — Other Ambulatory Visit (HOSPITAL_COMMUNITY): Payer: Self-pay

## 2022-07-28 NOTE — Telephone Encounter (Signed)
Prescription refill request for Eliquis received. Indication:Afib Last office visit:4/23 Scr:0.8 Age: 61 Weight:90 kg  Prescription refilled

## 2022-07-29 ENCOUNTER — Other Ambulatory Visit (HOSPITAL_COMMUNITY): Payer: Self-pay

## 2022-07-29 ENCOUNTER — Other Ambulatory Visit: Payer: Self-pay | Admitting: Hematology

## 2022-07-29 MED ORDER — CAPECITABINE 500 MG PO TABS
ORAL_TABLET | ORAL | 0 refills | Status: DC
  Filled 2022-07-31: qty 112, 21d supply, fill #0

## 2022-07-31 ENCOUNTER — Other Ambulatory Visit (HOSPITAL_COMMUNITY): Payer: Self-pay

## 2022-08-05 ENCOUNTER — Encounter: Payer: Self-pay | Admitting: Hematology

## 2022-08-05 ENCOUNTER — Inpatient Hospital Stay: Payer: Commercial Managed Care - HMO

## 2022-08-05 ENCOUNTER — Encounter: Payer: Self-pay | Admitting: Cardiovascular Disease

## 2022-08-05 ENCOUNTER — Other Ambulatory Visit: Payer: Self-pay

## 2022-08-05 ENCOUNTER — Other Ambulatory Visit (HOSPITAL_COMMUNITY): Payer: Self-pay

## 2022-08-05 ENCOUNTER — Inpatient Hospital Stay (HOSPITAL_BASED_OUTPATIENT_CLINIC_OR_DEPARTMENT_OTHER): Payer: Commercial Managed Care - HMO | Admitting: Hematology

## 2022-08-05 VITALS — BP 159/112 | HR 77 | Temp 97.7°F | Resp 18 | Ht 78.0 in | Wt 191.2 lb

## 2022-08-05 DIAGNOSIS — C182 Malignant neoplasm of ascending colon: Secondary | ICD-10-CM | POA: Diagnosis not present

## 2022-08-05 DIAGNOSIS — D509 Iron deficiency anemia, unspecified: Secondary | ICD-10-CM

## 2022-08-05 DIAGNOSIS — I5022 Chronic systolic (congestive) heart failure: Secondary | ICD-10-CM

## 2022-08-05 LAB — COMPREHENSIVE METABOLIC PANEL
ALT: 52 U/L — ABNORMAL HIGH (ref 0–44)
AST: 25 U/L (ref 15–41)
Albumin: 4.2 g/dL (ref 3.5–5.0)
Alkaline Phosphatase: 137 U/L — ABNORMAL HIGH (ref 38–126)
Anion gap: 4 — ABNORMAL LOW (ref 5–15)
BUN: 18 mg/dL (ref 8–23)
CO2: 35 mmol/L — ABNORMAL HIGH (ref 22–32)
Calcium: 9.5 mg/dL (ref 8.9–10.3)
Chloride: 101 mmol/L (ref 98–111)
Creatinine, Ser: 0.99 mg/dL (ref 0.61–1.24)
GFR, Estimated: >60 mL/min (ref >60)
Glucose, Bld: 99 mg/dL (ref 70–99)
Potassium: 3.9 mmol/L (ref 3.5–5.1)
Sodium: 140 mmol/L (ref 135–145)
Total Bilirubin: 0.4 mg/dL (ref 0.3–1.2)
Total Protein: 7.8 g/dL (ref 6.5–8.1)

## 2022-08-05 LAB — CBC WITH DIFFERENTIAL/PLATELET
Abs Immature Granulocytes: 0.01 10*3/uL (ref 0.00–0.07)
Basophils Absolute: 0 10*3/uL (ref 0.0–0.1)
Basophils Relative: 0 %
Eosinophils Absolute: 0.1 10*3/uL (ref 0.0–0.5)
Eosinophils Relative: 1 %
HCT: 47.6 % (ref 39.0–52.0)
Hemoglobin: 15.8 g/dL (ref 13.0–17.0)
Immature Granulocytes: 0 %
Lymphocytes Relative: 15 %
Lymphs Abs: 1.2 10*3/uL (ref 0.7–4.0)
MCH: 30.2 pg (ref 26.0–34.0)
MCHC: 33.2 g/dL (ref 30.0–36.0)
MCV: 91 fL (ref 80.0–100.0)
Monocytes Absolute: 0.6 10*3/uL (ref 0.1–1.0)
Monocytes Relative: 7 %
Neutro Abs: 6 10*3/uL (ref 1.7–7.7)
Neutrophils Relative %: 77 %
Platelets: 215 10*3/uL (ref 150–400)
RBC: 5.23 MIL/uL (ref 4.22–5.81)
RDW: 18 % — ABNORMAL HIGH (ref 11.5–15.5)
WBC: 7.9 10*3/uL (ref 4.0–10.5)
nRBC: 0 % (ref 0.0–0.2)

## 2022-08-05 LAB — IRON AND IRON BINDING CAPACITY (CC-WL,HP ONLY)
Iron: 107 ug/dL (ref 45–182)
Saturation Ratios: 29 % (ref 17.9–39.5)
TIBC: 367 ug/dL (ref 250–450)
UIBC: 260 ug/dL (ref 117–376)

## 2022-08-05 NOTE — Progress Notes (Signed)
[place]   [phone] Fax:([phone]) [phone]   Clinic Follow up Note   Patient Care Team: Patient, No Pcp Per as PCP - General (General Practice) Croitoru, Dani Gobble, MD as PCP - Cardiology (Cardiology) [name], MD as PCP - Electrophysiology (Cardiology) [name], MD as Consulting Physician (Neurology) [name], MD as Consulting Physician (Hematology) Stechschulte, Nickola Major, MD as Consulting Physician (Surgery)  Date of Service:  08/05/2022  CHIEF COMPLAINT: f/u of colon cancer  CURRENT THERAPY:  Xeloda, q21d, on 07/21/22             -dose: 203m BID on days 1-14  ASSESSMENT & PLAN:  Gregory WKayde Warehimeis a 61y.o. male with   1. 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 FOBT test positive. Colonoscopy on 04/16/22 with Dr. AHavery Morosconfirmed adenocarcinoma. -s/p resection on 04/18/22 by Dr. SThermon Leyland path showed: 6.4 cm moderate to poorly differentiated adenocarcinoma; 11/35 nodes were positive; lymphovascular space invasion present; margins negative. MMR normal. Of note, appendix was uninvolved.  -he developed intra-abdominal abscess, which required draining placement on 05/09/22. CT AP 07/16/22 showed near resolution and draining tube was removed. -he started adjuvant Xeloda on 07/21/22. He tolerated well with fatigue and worsening skin peeling in feet. He notes skin dryness was present prior to starting treatment. -PET scan 07/24/22 showed: numerous hepatic metastatic lesions, two small hypermetabolic upper abdominal lymph nodes; no metastatic disease in chest or bony structures. I reviewed the images and discussed with pt today  -I again discussed adding oxaliplatin to his treatment regimen, which would also involve port placement. They would like to get his  BP under better control before adding more chemo  -lab reviewed, overall stable. -I will request [facility] on his surgical sample to see if he is a candidate for targeted therapy  -He is not a candidate for bevacizumab in the near future due to his poorly controlled hypertension.  2. Uncontrolled hypertension -new since /2023, previously well managed. -managed by cardiologist, elevated BP persists despite recent increase in metoprolol. -BP up to 159/112 today (08/05/22), they have reached out to his cardiologist.    3. Anemia from GI bleeding and iron deficiency -secondary to #1 -he received IV Feraheme and blood transfusion during hospitalization -labs today (8/3) show resolution of anemia, hgb up to 14.5. Iron panel and ferritin are pending.   4. CAD, A-fib, CHF with EF 20%, status post AICD placement -s/p left heart cath 05/18/22 -continue medications and f/u per cardiology     PLAN: -continue Xeloda at same dose, he will start cycle 2 in a week  -EKG done today, I reviewed  -lab and f/u on 9/14 or 9/15 before cycle 3, plan to add oxaliplatin to cycle 4 -request FO on his surgical sample    No problem-specific Assessment & Plan notes found for this encounter.   SUMMARY OF ONCOLOGIC HISTORY: Oncology History Overview Note   Cancer Staging  Cancer of right colon ([facility] Staging form: Colon and Rectum, AJCC 8th Edition - Pathologic stage from 04/22/2022: pT3, pN2b, cM1 - Signed by FTruitt Merle MD on 05/05/2022    Cancer of right colon ([facility]  04/12/2022 Miscellaneous   Fecal Occult Bld - POSITIVE   04/16/2022 Imaging   EXAM: CT CHEST, ABDOMEN, AND PELVIS WITH CONTRAST  IMPRESSION: 1. Infiltrative mass  in the ascending colon concerning for primary colonic neoplasm, estimated to measure approximately 6.5 x 4.7 x 7.4 cm, with evidence of local infiltration of disease in the pericolonic space adjacent to the lesion, as well as pathologically enlarged ileocolic and retroperitoneal  lymphadenopathy, concerning for nodal metastasis. 2. Bilateral adrenal nodules which are indeterminate, but potentially metastatic. 3. Multiple hypovascular lesions scattered throughout the pancreas. The possibility of metastatic disease to the pancreas should be considered. Alternatively, benign etiologies such as chronic pancreatic pseudocysts could have a similar appearance (no recent prior studies are available for comparison). Close attention on follow-up studies is recommended to ensure the stability or regression of these pancreatic lesions. 4. Small to moderate left pleural effusion lying dependently with some passive subsegmental atelectasis in the left lower lobe. 5. Cardiomegaly with left atrial dilatation. 6. Aortic atherosclerosis, in addition to left main and three-vessel coronary artery disease. Please note that although the presence of coronary artery calcium documents the presence of coronary artery disease, the severity of this disease and any potential stenosis cannot be assessed on this non-gated CT examination. Assessment for potential risk factor modification, dietary therapy or pharmacologic therapy may be warranted, if clinically indicated. 7. There are calcifications of the aortic valve. Echocardiographic correlation for evaluation of potential valvular dysfunction may be warranted if clinically indicated. 8. Additional incidental findings, as above.   04/16/2022 Procedure   Colonoscopy; Dr. Havery Moros  Impression: -Stool in the entire examined colon, several minutes spent lavaging the colon - Likely malignant partially obstructing tumor in the suspected proximal transverse colon vs. right colon as above - could not traverse it. Biopsied. Tattooed. - Five 3 to 10 mm polyps in the transverse colon, removed with a cold snare. Resected and retrieved. - Two 6 to 8 mm polyps in the transverse colon, removed with a hot snare. Resected and retrieved. - One 6 mm polyp in the  descending colon, removed with a cold snare. Resected and retrieved. - Two 3 to 4 mm polyps in the sigmoid colon, removed with a cold snare. Resected and retrieved. - One 15 mm polyp in the sigmoid colon, removed with a hot snare. Resected and retrieved. - One 3 to 4 mm polyp at the recto-sigmoid colon, removed with a cold snare. Resected and retrieved. - Internal hemorrhoids. - The examination was otherwise normal.  Colon mass is likely malignant and the cause of anemia / bleeding.   04/16/2022 Initial Biopsy   FINAL MICROSCOPIC DIAGNOSIS:   A. COLON MASS, BIOPSY:  - Moderately differentiated colonic adenocarcinoma.  - See comment.   B. COLON, TRANSVERSE, DESCENDING AND SIGMOID, POLYPECTOMY:  - Tubular adenoma (s) without high grade dysplasia.  - Sessile serrated adenoma(s) without cytologic dysplasia.   C. COLON, SIGMOID, POLYPECTOMY:  - Tubular adenoma without high grade dysplasia.    04/17/2022 Initial Diagnosis   Cancer of right colon ([place])   04/18/2022 Definitive Surgery   FINAL MICROSCOPIC DIAGNOSIS:   A. COLON, RIGHT, RESECTION:  -  Adenocarcinoma, moderate to poorly differentiated, 6.4 cm  -  Metastatic adenocarcinoma involving eleven of thirty-five lymph nodes (11/35)  -  Lymphovascular space invasion present  -  Margins uninvolved by carcinoma  -  Multiple tubular adenomas  -  Appendix uninvolved by adenocarcinoma  -  See oncology table and comment below   ADDENDUM:  Mismatch Repair Protein ([facility])  SUMMARY INTERPRETATION: NORMAL   04/22/2022 Cancer Staging   Staging form: Colon and Rectum, AJCC 8th Edition - Pathologic stage from 04/22/2022: pT3, pN2b, cM1 -  Signed by [name], MD on 05/05/2022 Stage prefix: Initial diagnosis Histologic grading system: 4 grade system Histologic grade (G): G3 Residual tumor (R): R0 - None      INTERVAL HISTORY:  [facility] is here for a follow up of colon cancer. He was last seen by me on 07/17/22. He presents to the  clinic accompanied by his daughter. He reports he is doing well overall. They report he experienced fatigue and skin dryness. He notes the skin dryness was present previously but worsened with Xeloda. He reports pain to his left hip, getting up to 9/10 at night. He denies radiating pain.   All other systems were reviewed with the patient and are negative.  MEDICAL HISTORY:  Past Medical History:  Diagnosis Date   AICD (automatic cardioverter/defibrillator) present 2005   CAD (coronary artery disease) 12/01/2013   Chronic combined systolic and diastolic CHF, NYHA class 1 ([place]) 12/01/2013   Erectile dysfunction 12/01/2013   HTN (hypertension) 12/01/2013   Hyperlipidemia 12/01/2013   Ischemic cardiomyopathy 12/01/2013   Presence of permanent cardiac pacemaker    Sleep apnea     SURGICAL HISTORY: Past Surgical History:  Procedure Laterality Date   ACHILLES TENDON REPAIR     lft foot   BIOPSY  04/16/2022   Procedure: BIOPSY;  Surgeon: Yetta Flock, MD;  Location: [facility];  Service: Gastroenterology;;   CARDIAC CATHETERIZATION N/A 05/05/2016   Procedure: Left Heart Cath and Coronary Angiography;  Surgeon: Leonie Man, MD;  Location: [place] [facility];  Service: Cardiovascular;  Laterality: N/A;   CARDIAC DEFIBRILLATOR PLACEMENT     CARDIOVERSION N/A 09/13/2020   Procedure: CARDIOVERSION;  Surgeon: Werner Lean, MD;  Location: [facility];  Service: Cardiovascular;  Laterality: N/A;   CARDIOVERSION N/A 09/19/2020   Procedure: CARDIOVERSION;  Surgeon: [name], MD;  Location: [facility] ENDOSCOPY;  Service: Cardiovascular;  Laterality: N/A;   COLON RESECTION N/A 04/18/2022   Procedure: HAND ASSISTED LAPAROSCOPIC RIGHT COLON RESECTION;  Surgeon: Felicie Morn, MD;  Location: [facility];  Service: General;  Laterality: N/A;   COLONOSCOPY WITH PROPOFOL N/A 04/16/2022   Procedure: COLONOSCOPY WITH PROPOFOL;  Surgeon: Yetta Flock, MD;  Location: [facility];   Service: Gastroenterology;  Laterality: N/A;   EP IMPLANTABLE DEVICE N/A 12/19/2016   Procedure: ICD Generator Changeout;  Surgeon: [name], MD;  Location: [place] [facility];  Service: Cardiovascular;  Laterality: N/A;   ESOPHAGOGASTRODUODENOSCOPY N/A 04/16/2022   Procedure: ESOPHAGOGASTRODUODENOSCOPY (EGD);  Surgeon: Yetta Flock, MD;  Location: [facility] ENDOSCOPY;  Service: Gastroenterology;  Laterality: N/A;   IR ANGIOGRAM SELECTIVE EACH ADDITIONAL VESSEL  05/09/2022   IR ANGIOGRAM VISCERAL SELECTIVE  05/09/2022   IR CATHETER TUBE CHANGE  05/14/2022   IR CATHETER TUBE CHANGE  06/27/2022   IR EMBO ART  VEN HEMORR LYMPH EXTRAV  INC GUIDE ROADMAPPING  05/09/2022   IR IMAGE GUIDED DRAINAGE PERCUT CATH  PERITONEAL RETROPERIT  05/09/2022   IR PATIENT EVAL TECH 0-60 MINS  06/11/2022   IR RADIOLOGIST EVAL & MGMT  06/26/2022   IR RADIOLOGIST EVAL & MGMT  07/16/2022   IR US GUIDE VASC ACCESS RIGHT  05/09/2022   LEFT HEART CATH AND CORONARY ANGIOGRAPHY N/A 04/17/2022   Procedure: LEFT HEART CATH AND CORONARY ANGIOGRAPHY;  Surgeon: Belva Crome, MD;  Location: [place] [facility];  Service: Cardiovascular;  Laterality: N/A;   POLYPECTOMY  04/16/2022   Procedure: POLYPECTOMY;  Surgeon: Yetta Flock, MD;  Location[facility]  ENDOSCOPY;  Service: Gastroenterology;;   RIGHT/LEFT HEART CATH AND CORONARY ANGIOGRAPHY N/A 09/24/2020   Procedure: RIGHT/LEFT HEART CATH AND CORONARY ANGIOGRAPHY;  Surgeon: Lorretta Harp, MD;  Location: [place] [facility];  Service: Cardiovascular;  Laterality: N/A;   SUBMUCOSAL TATTOO INJECTION  04/16/2022   Procedure: SUBMUCOSAL TATTOO INJECTION;  Surgeon: Yetta Flock, MD;  Location: [facility] ENDOSCOPY;  Service: Gastroenterology;;   WRIST TENODESIS      I have reviewed the social history and family history with the patient and they are unchanged from previous note.  ALLERGIES:  has No Known Allergies.  MEDICATIONS:  Current Outpatient Medications  Medication Sig Dispense  Refill   acetaminophen (TYLENOL) 325 MG tablet Take 1-2 tablets (325-650 mg total) by mouth every 4 (four) hours as needed for mild pain.     ALPRAZolam (XANAX) 0.25 MG tablet Take 1 tablet (0.25 mg total) by mouth at bedtime as needed for anxiety or sleep. 30 tablet 0   amiodarone (PACERONE) 400 MG tablet Take 1 tablet (400 mg total) by mouth 2 (two) times daily. 60 tablet 0   atorvastatin (LIPITOR) 80 MG tablet Take 1 tablet (80 mg total) by mouth every evening. 90 tablet 3   capecitabine (XELODA) 500 MG tablet Take 4 tablets every 12 hours for 14 days on and 7 days off, take after a meal. 112 tablet 0   dapagliflozin propanediol (FARXIGA) 10 MG TABS tablet Take 1 tablet (10 mg total) by mouth daily. 90 tablet 3   diclofenac Sodium (VOLTAREN) 1 % GEL Apply 2 g topically 4 (four) times daily.     docusate sodium (COLACE) 100 MG capsule Take 1 capsule (100 mg total) by mouth 2 (two) times daily. 10 capsule 0   ELIQUIS 5 MG TABS tablet TAKE 1 TABLET(5 MG) BY MOUTH TWICE DAILY 180 tablet 1   ezetimibe (ZETIA) 10 MG tablet Take 1 tablet (10 mg total) by mouth daily. 30 tablet 0   feeding supplement (ENSURE ENLIVE / ENSURE PLUS) LIQD Take 237 mLs by mouth 2 (two) times daily between meals. 237 mL 12   furosemide (LASIX) 40 MG tablet Take 1 tablet (40 mg total) by mouth daily. 30 tablet 0   levETIRAcetam (KEPPRA) 500 MG tablet Take 1 tablet twice a day 180 tablet 3   methocarbamol (ROBAXIN) 500 MG tablet Take 1 tablet (500 mg total) by mouth every 6 (six) hours as needed for muscle spasms. 30 tablet 0   metoprolol succinate (TOPROL-XL) 50 MG 24 hr tablet Take 1 tablet (50 mg total) by mouth 2 (two) times daily. Take with or immediately following a meal. 180 tablet 3   mexiletine (MEXITIL) 150 MG capsule Take 1 capsule (150 mg total) by mouth every 8 (eight) hours. 30 capsule 0   nystatin (MYCOSTATIN/NYSTOP) powder Apply topically 2 (two) times daily. Apply To groin area 15 g 0   oxyCODONE-acetaminophen  (PERCOCET/ROXICET) 5-325 MG tablet Take 1 tablet by mouth every 6 (six) hours as needed for moderate pain or severe pain. 21 tablet 0   polyethylene glycol (MIRALAX / GLYCOLAX) 17 g packet Take 17 g by mouth 2 (two) times daily. 14 each 0   potassium chloride (KLOR-CON M) 10 MEQ tablet Take 1 tablet (10 mEq total) by mouth daily. 30 tablet 6   sacubitril-valsartan (ENTRESTO) 49-51 MG Take 1 tablet by mouth 2 (two) times daily. 180 tablet 1   sildenafil (VIAGRA) 50 MG tablet Take 1 tablet (50 mg total) by mouth as needed for  erectile dysfunction. 30 tablet 4   traZODone (DESYREL) 100 MG tablet Take 1 tablet (100 mg total) by mouth at bedtime. 90 tablet 3   No current facility-administered medications for this visit.    PHYSICAL EXAMINATION: ECOG PERFORMANCE STATUS: 2 - Symptomatic, <50% confined to bed  Vitals:   08/05/22 1315  BP: (!) 159/112  Pulse: 77  Resp: 18  Temp: 97.7 F (36.5 C)  SpO2: 97%   Wt Readings from Last 3 Encounters:  08/05/22 191 lb 3.2 oz (86.7 kg)  07/17/22 198 lb 7 oz (90 kg)  06/25/22 191 lb 11.2 oz (87 kg)     GENERAL:alert, no distress and comfortable SKIN: skin color normal, no rashes or significant lesions EYES: normal, Conjunctiva are pink and non-injected, sclera clear  NEURO: alert & oriented x 3 with fluent speech  LABORATORY DATA:  I have reviewed the data as listed    Latest Ref Rng & Units 08/05/2022   12:58 PM 07/17/2022    8:15 AM 06/25/2022   11:06 AM  CBC  WBC 4.0 - 10.5 K/uL 7.9  6.9  8.2   Hemoglobin 13.0 - 17.0 g/dL 15.8  14.5  13.7   Hematocrit 39.0 - 52.0 % 47.6  45.4  44.1   Platelets 150 - 400 K/uL 215  203  280         Latest Ref Rng & Units 08/05/2022   12:58 PM 07/17/2022    8:15 AM 06/25/2022   11:06 AM  CMP  Glucose 70 - 99 mg/dL 99  100  92   BUN 8 - 23 mg/dL '18  17  14   ' Creatinine 0.61 - 1.24 mg/dL 0.99  0.85  0.78   Sodium 135 - 145 mmol/L 140  137  137   Potassium 3.5 - 5.1 mmol/L 3.9  4.3  4.3   Chloride 98 -  111 mmol/L 101  102  102   CO2 22 - 32 mmol/L 35  30  30   Calcium 8.9 - 10.3 mg/dL 9.5  8.6  9.0   Total Protein 6.5 - 8.1 g/dL 7.8  7.3  7.8   Total Bilirubin 0.3 - 1.2 mg/dL 0.4  0.3  0.3   Alkaline Phos 38 - 126 U/L 137  158  157   AST 15 - 41 U/L 25  27  39   ALT 0 - 44 U/L 52  52  59       RADIOGRAPHIC STUDIES: I have personally reviewed the radiological images as listed and agreed with the findings in the report. No results found.    Orders Placed This Encounter  Procedures   EKG 12-Lead    Ordered by an unspecified provider    All questions were answered. The patient knows to call the clinic with any problems, questions or concerns. No barriers to learning was detected. The total time spent in the appointment was 30 minutes.     [name], MD 08/05/2022   I, Wilburn Mylar, am acting as scribe for [name], MD.   I have reviewed the above documentation for accuracy and completeness, and I agree with the above.

## 2022-08-06 MED ORDER — SACUBITRIL-VALSARTAN 97-103 MG PO TABS: 1.0000 | ORAL_TABLET | Freq: Two times a day (BID) | ORAL | 3 refills | Status: DC

## 2022-08-06 NOTE — Telephone Encounter (Signed)
Alright. The logical step is toi increase the Entresto to 97/103 twice daily. Potassium and kidney parameters are now normal. Check BMET and proBNP 2 weeks after increasing the entresto please.

## 2022-08-08 ENCOUNTER — Ambulatory Visit (INDEPENDENT_AMBULATORY_CARE_PROVIDER_SITE_OTHER): Payer: Commercial Managed Care - HMO

## 2022-08-08 DIAGNOSIS — Z95810 Presence of automatic (implantable) cardiac defibrillator: Secondary | ICD-10-CM | POA: Diagnosis not present

## 2022-08-08 LAB — CUP PACEART REMOTE DEVICE CHECK
Battery Remaining Longevity: 49 mo
Battery Voltage: 2.9 V
Brady Statistic AP VP Percent: 0.02 %
Brady Statistic AP VS Percent: 6.66 %
Brady Statistic AS VP Percent: 0.07 %
Brady Statistic AS VS Percent: 93.25 %
Brady Statistic RA Percent Paced: 6.68 %
Brady Statistic RV Percent Paced: 0.1 %
Date Time Interrogation Session: 20230825022506
HighPow Impedance: 43 Ohm
HighPow Impedance: 56 Ohm
Implantable Lead Implant Date: 20051120
Implantable Lead Implant Date: 20101117
Implantable Lead Location: 753859
Implantable Lead Location: 753860
Implantable Lead Model: 5076
Implantable Lead Model: 7121
Implantable Pulse Generator Implant Date: 20180105
Lead Channel Impedance Value: 285 Ohm
Lead Channel Impedance Value: 399 Ohm
Lead Channel Impedance Value: 418 Ohm
Lead Channel Pacing Threshold Amplitude: 0.625 V
Lead Channel Pacing Threshold Amplitude: 1.25 V
Lead Channel Pacing Threshold Pulse Width: 0.4 ms
Lead Channel Pacing Threshold Pulse Width: 0.4 ms
Lead Channel Sensing Intrinsic Amplitude: 16.25 mV
Lead Channel Sensing Intrinsic Amplitude: 16.25 mV
Lead Channel Sensing Intrinsic Amplitude: 4.25 mV
Lead Channel Sensing Intrinsic Amplitude: 4.25 mV
Lead Channel Setting Pacing Amplitude: 1.5 V
Lead Channel Setting Pacing Amplitude: 2.75 V
Lead Channel Setting Pacing Pulse Width: 0.4 ms
Lead Channel Setting Sensing Sensitivity: 0.6 mV

## 2022-08-11 ENCOUNTER — Other Ambulatory Visit (HOSPITAL_COMMUNITY): Payer: Self-pay

## 2022-08-12 ENCOUNTER — Other Ambulatory Visit: Payer: Self-pay

## 2022-08-12 ENCOUNTER — Inpatient Hospital Stay: Payer: PRIVATE HEALTH INSURANCE | Admitting: Internal Medicine

## 2022-08-14 ENCOUNTER — Encounter (HOSPITAL_COMMUNITY): Payer: Self-pay | Admitting: Hematology

## 2022-08-21 ENCOUNTER — Other Ambulatory Visit: Payer: Self-pay | Admitting: Hematology

## 2022-08-21 ENCOUNTER — Other Ambulatory Visit (HOSPITAL_COMMUNITY): Payer: Self-pay

## 2022-08-21 MED ORDER — CAPECITABINE 500 MG PO TABS
ORAL_TABLET | ORAL | 0 refills | Status: DC
  Filled 2022-08-21: qty 112, 21d supply, fill #0

## 2022-08-24 ENCOUNTER — Other Ambulatory Visit: Payer: Self-pay | Admitting: Cardiovascular Disease

## 2022-08-25 ENCOUNTER — Other Ambulatory Visit (HOSPITAL_COMMUNITY): Payer: Self-pay

## 2022-08-27 ENCOUNTER — Telehealth: Payer: Self-pay | Admitting: Cardiovascular Disease

## 2022-08-27 MED ORDER — CLOPIDOGREL BISULFATE 75 MG PO TABS: 75.0000 mg | ORAL_TABLET | Freq: Every day | ORAL | 3 refills | Status: DC

## 2022-08-27 NOTE — Telephone Encounter (Signed)
Pt c/o medication issue:  1. Name of Medication: clopidogrel   2. How are you currently taking this medication (dosage and times per day)? 1 tablet daily  3. Are you having a reaction (difficulty breathing--STAT)? no  4. What is your medication issue? Patient states he tried to refill his medication, but it was denied. The medication is not currently listed on his medication list.

## 2022-08-27 NOTE — Telephone Encounter (Signed)
Spoke to patient Dr.Croitoru advised to continue Plavix.Refill already sent to pharmacy.

## 2022-08-27 NOTE — Telephone Encounter (Signed)
Thanks - it was stopped when he had problems with an abdominal abscess, but seems appropriate to continue clopidogrel now.

## 2022-08-27 NOTE — Telephone Encounter (Signed)
Spoke to patient stated he needs Plavix refilled.After reviewing chart Plavix not listed on medication list.Stated he has been taking 75 mg daily.Refill sent to pharmacy.He is past due to see Dr.Croitoru.Appointment scheduled with Dr.Croitoru 10/11 at 2:40 pm.I will make Dr.Croitoru aware.

## 2022-08-29 ENCOUNTER — Inpatient Hospital Stay: Payer: Commercial Managed Care - HMO | Attending: Hematology | Admitting: Hematology

## 2022-08-29 ENCOUNTER — Inpatient Hospital Stay: Payer: Commercial Managed Care - HMO

## 2022-08-29 ENCOUNTER — Other Ambulatory Visit: Payer: Self-pay

## 2022-08-29 VITALS — BP 151/100 | HR 53 | Temp 97.5°F | Ht 78.0 in | Wt 197.1 lb

## 2022-08-29 DIAGNOSIS — C182 Malignant neoplasm of ascending colon: Secondary | ICD-10-CM | POA: Diagnosis present

## 2022-08-29 DIAGNOSIS — K648 Other hemorrhoids: Secondary | ICD-10-CM | POA: Diagnosis not present

## 2022-08-29 DIAGNOSIS — I2510 Atherosclerotic heart disease of native coronary artery without angina pectoris: Secondary | ICD-10-CM | POA: Insufficient documentation

## 2022-08-29 DIAGNOSIS — R197 Diarrhea, unspecified: Secondary | ICD-10-CM | POA: Insufficient documentation

## 2022-08-29 DIAGNOSIS — D124 Benign neoplasm of descending colon: Secondary | ICD-10-CM | POA: Diagnosis not present

## 2022-08-29 DIAGNOSIS — I5042 Chronic combined systolic (congestive) and diastolic (congestive) heart failure: Secondary | ICD-10-CM | POA: Insufficient documentation

## 2022-08-29 DIAGNOSIS — Z7902 Long term (current) use of antithrombotics/antiplatelets: Secondary | ICD-10-CM | POA: Diagnosis not present

## 2022-08-29 DIAGNOSIS — I700 Atherosclerosis of aorta: Secondary | ICD-10-CM | POA: Insufficient documentation

## 2022-08-29 DIAGNOSIS — D500 Iron deficiency anemia secondary to blood loss (chronic): Secondary | ICD-10-CM | POA: Insufficient documentation

## 2022-08-29 DIAGNOSIS — D123 Benign neoplasm of transverse colon: Secondary | ICD-10-CM | POA: Diagnosis not present

## 2022-08-29 DIAGNOSIS — D125 Benign neoplasm of sigmoid colon: Secondary | ICD-10-CM | POA: Diagnosis not present

## 2022-08-29 DIAGNOSIS — D509 Iron deficiency anemia, unspecified: Secondary | ICD-10-CM

## 2022-08-29 DIAGNOSIS — K920 Hematemesis: Secondary | ICD-10-CM | POA: Insufficient documentation

## 2022-08-29 DIAGNOSIS — Z79899 Other long term (current) drug therapy: Secondary | ICD-10-CM | POA: Diagnosis not present

## 2022-08-29 DIAGNOSIS — Z7901 Long term (current) use of anticoagulants: Secondary | ICD-10-CM | POA: Diagnosis not present

## 2022-08-29 LAB — COMPREHENSIVE METABOLIC PANEL
ALT: 25 U/L (ref 0–44)
AST: 19 U/L (ref 15–41)
Albumin: 4 g/dL (ref 3.5–5.0)
Alkaline Phosphatase: 89 U/L (ref 38–126)
Anion gap: 11 (ref 5–15)
BUN: 16 mg/dL (ref 8–23)
CO2: 27 mmol/L (ref 22–32)
Calcium: 9.1 mg/dL (ref 8.9–10.3)
Chloride: 102 mmol/L (ref 98–111)
Creatinine, Ser: 1.19 mg/dL (ref 0.61–1.24)
GFR, Estimated: >60 mL/min (ref >60)
Glucose, Bld: 112 mg/dL — ABNORMAL HIGH (ref 70–99)
Potassium: 3.7 mmol/L (ref 3.5–5.1)
Sodium: 140 mmol/L (ref 135–145)
Total Bilirubin: 0.3 mg/dL (ref 0.3–1.2)
Total Protein: 7.5 g/dL (ref 6.5–8.1)

## 2022-08-29 LAB — CBC WITH DIFFERENTIAL/PLATELET
Abs Immature Granulocytes: 0.05 10*3/uL (ref 0.00–0.07)
Basophils Absolute: 0 10*3/uL (ref 0.0–0.1)
Basophils Relative: 0 %
Eosinophils Absolute: 0 10*3/uL (ref 0.0–0.5)
Eosinophils Relative: 0 %
HCT: 48.8 % (ref 39.0–52.0)
Hemoglobin: 16.7 g/dL (ref 13.0–17.0)
Immature Granulocytes: 0 %
Lymphocytes Relative: 8 %
Lymphs Abs: 1.2 10*3/uL (ref 0.7–4.0)
MCH: 31.3 pg (ref 26.0–34.0)
MCHC: 34.2 g/dL (ref 30.0–36.0)
MCV: 91.4 fL (ref 80.0–100.0)
Monocytes Absolute: 1.1 10*3/uL — ABNORMAL HIGH (ref 0.1–1.0)
Monocytes Relative: 8 %
Neutro Abs: 11.8 10*3/uL — ABNORMAL HIGH (ref 1.7–7.7)
Neutrophils Relative %: 84 %
Platelets: 146 10*3/uL — ABNORMAL LOW (ref 150–400)
RBC: 5.34 MIL/uL (ref 4.22–5.81)
RDW: 19.7 % — ABNORMAL HIGH (ref 11.5–15.5)
WBC: 14.1 10*3/uL — ABNORMAL HIGH (ref 4.0–10.5)
nRBC: 0 % (ref 0.0–0.2)

## 2022-08-29 LAB — IRON AND IRON BINDING CAPACITY (CC-WL,HP ONLY)
Iron: 46 ug/dL (ref 45–182)
Saturation Ratios: 12 % — ABNORMAL LOW (ref 17.9–39.5)
TIBC: 372 ug/dL (ref 250–450)
UIBC: 326 ug/dL

## 2022-08-29 LAB — CEA (IN HOUSE-CHCC): CEA (CHCC-In House): 4.25 ng/mL (ref 0.00–5.00)

## 2022-08-29 LAB — FERRITIN: Ferritin: 236 ng/mL (ref 24–336)

## 2022-08-29 MED ORDER — ALPRAZOLAM 0.25 MG PO TABS: 0.2500 mg | ORAL_TABLET | Freq: Every evening | ORAL | 0 refills | Status: DC | PRN

## 2022-08-29 NOTE — Progress Notes (Signed)
Gregory [name]   [phone] [phone]   Clinic Follow up Note   Patient Care Team: Patient, No Pcp Per as PCP - General (General Practice) Croitoru, Gregory Gobble, MD as PCP - Cardiology (Cardiology) [name], MD as PCP - Electrophysiology (Cardiology) Gregory Sprang, MD as Consulting Physician (Neurology) Gregory Merle, MD as Consulting Physician (Hematology) Gregory [name], Gregory Major, MD as Consulting Physician (Surgery)  Date of Service:  08/29/2022  CHIEF COMPLAINT: f/u of colon cancer  CURRENT THERAPY:  Xeloda, q21d, on 07/21/22             -dose: 2028m BID on days 1-14  ASSESSMENT & PLAN:  Gregory WPhelix Fudalais a 61y.o. male with   1. 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  He does not want to IV chemotherapy at this point.  He is not a candidate for bevacizumab due to his uncontrolled hypertension. -Plan to repeat restaging scan after 4 cycles Xeloda   2. Uncontrolled hypertension -he is scheduled for f/ups with Dr. KCaryl Comes10/9/23 and Dr. CSallyanne Kuster10/11/23.   3. Anemia from GI bleeding and iron deficiency -secondary to #1 -he received IV Feraheme and blood transfusion during hospitalization    PLAN: -continue Xeloda at same dose -He will start cycle 3 on 9/18 -f/u in 3 weeks with lab -repeat CT in 5-6 weeks    No problem-specific Assessment & Plan notes found for this encounter.   SUMMARY OF ONCOLOGIC HISTORY: Oncology History Overview Note   Cancer Staging  Cancer of right colon ([facility] Staging form: Colon and Rectum, AJCC 8th Edition - Pathologic stage from 04/22/2022: pT3, pN2b, cM1 - Signed by FTruitt Merle MD on 05/05/2022    Cancer of right colon ([facility]  04/12/2022  Miscellaneous   Fecal Occult Bld - POSITIVE   04/16/2022 Imaging   EXAM: CT CHEST, ABDOMEN, AND PELVIS WITH CONTRAST  IMPRESSION: 1. 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 2. Bilateral adrenal nodules which are indeterminate, but potentially metastatic. 3. Multiple hypovascular lesions scattered throughout the pancreas. The possibility of metastatic disease to the pancreas should be considered. Alternatively, benign etiologies such as chronic pancreatic pseudocysts could have a similar appearance (no recent prior studies are available for comparison). Close attention on follow-up studies is recommended to ensure the stability or regression of these pancreatic lesions. 4. Small to moderate left pleural effusion lying dependently with some passive subsegmental atelectasis in the left lower lobe. 5. Cardiomegaly with left atrial dilatation. 6. Aortic atherosclerosis, in addition to left main and three-vessel coronary artery disease. Please note that although the presence of coronary artery calcium documents the presence of coronary artery disease, the severity of this disease and any potential stenosis cannot be assessed on this non-gated CT examination. Assessment for potential risk factor modification, dietary therapy or pharmacologic therapy may be warranted, if clinically indicated. 7. There are calcifications of the aortic valve. Echocardiographic correlation for evaluation of potential valvular dysfunction may be warranted if clinically indicated. 8. Additional incidental findings, as above.  04/16/2022 Procedure   Colonoscopy; Dr. Havery [name]  Impression: -Stool in the entire  examined colon, several minutes spent lavaging the colon - Likely malignant partially obstructing tumor in the suspected proximal transverse colon vs. right colon as above - could not traverse it. Biopsied. Tattooed. - Five 3 to 10 mm polyps in the transverse colon, removed with a cold snare. Resected and retrieved. - Two 6 to 8 mm polyps in the transverse colon, removed with a hot snare. Resected and retrieved. - One 6 mm polyp in the descending colon, removed with a cold snare. Resected and retrieved. - Two 3 to 4 mm polyps in the sigmoid colon, removed with a cold snare. Resected and retrieved. - One 15 mm polyp in the sigmoid colon, removed with a hot snare. Resected and retrieved. - One 3 to 4 mm polyp at the recto-sigmoid colon, removed with a cold snare. Resected and retrieved. - Internal hemorrhoids. - The examination was otherwise normal.  Colon mass is likely malignant and the cause of anemia / bleeding.   04/16/2022 Initial Biopsy   FINAL MICROSCOPIC DIAGNOSIS:   A. COLON MASS, BIOPSY:  - Moderately differentiated colonic adenocarcinoma.  - See comment.   B. COLON, TRANSVERSE, DESCENDING AND SIGMOID, POLYPECTOMY:  - Tubular adenoma (s) without high grade dysplasia.  - Sessile serrated adenoma(s) without cytologic dysplasia.   C. COLON, SIGMOID, POLYPECTOMY:  - Tubular adenoma without high grade dysplasia.    04/17/2022 Initial Diagnosis   Cancer of right colon ([place])   04/18/2022 Definitive Surgery   FINAL MICROSCOPIC DIAGNOSIS:   A. COLON, RIGHT, RESECTION:  -  Adenocarcinoma, moderate to poorly differentiated, 6.4 cm  -  Metastatic adenocarcinoma involving eleven of thirty-five lymph nodes (11/35)  -  Lymphovascular space invasion present  -  Margins uninvolved by carcinoma  -  Multiple tubular adenomas  -  Appendix uninvolved by adenocarcinoma  -  See oncology table and comment below   ADDENDUM:  Mismatch Repair Protein (IHC)  SUMMARY INTERPRETATION: NORMAL    04/22/2022 Cancer Staging   Staging form: Colon and Rectum, AJCC 8th Edition - Pathologic stage from 04/22/2022: pT3, pN2b, cM1 - Signed by Gregory Merle, MD on 05/05/2022 Stage prefix: Initial diagnosis Histologic grading system: 4 grade system Histologic grade (G): G3 Residual tumor (R): R0 - None      INTERVAL HISTORY:  [facility] is here for a follow up of colon cancer. He was last seen by me on 08/05/22. He presents to the clinic accompanied by daughter.  He is clinically stable, denies any significant pain, or nausea.  He had a few episodes of diarrhea.  He has good appetite and eats well.  His energy level remains to be low, he is able to tolerate light activities, not very active.   All other systems were reviewed with the patient and are negative.  MEDICAL HISTORY:  Past Medical History:  Diagnosis Date   AICD (automatic cardioverter/defibrillator) present 2005   CAD (coronary artery disease) 12/01/2013   Chronic combined systolic and diastolic CHF, NYHA class 1 ([place]) 12/01/2013   Erectile dysfunction 12/01/2013   HTN (hypertension) 12/01/2013   Hyperlipidemia 12/01/2013   Ischemic cardiomyopathy 12/01/2013   Presence of permanent cardiac pacemaker    Sleep apnea     SURGICAL HISTORY: Past Surgical History:  Procedure Laterality Date   ACHILLES TENDON REPAIR     lft foot   BIOPSY  04/16/2022   Procedure: BIOPSY;  Surgeon: Yetta Flock, MD;  Location: [facility] ENDOS[facility];  Service: Gastroenterology;;   CARDIAC CATHETERIZATION N/A 05/05/2016   Procedure: Left Heart Cath and Coronary Angiography;  Surgeon: Leonie Man, MD;  Location: [place] [facility];  Service: Cardiovascular;  Laterality: N/A;   CARDIAC DEFIBRILLATOR PLACEMENT     CARDIOVERSION N/A 09/13/2020   Procedure: CARDIOVERSION;  Surgeon: Werner Lean, MD;  Location: [place] ENDOSCOPY;  Service: Cardiovascular;  Laterality: N/A;   CARDIOVERSION N/A 09/19/2020   Procedure: CARDIOVERSION;   Surgeon: [name], MD;  Location: [facility] ENDOSCOPY;  Service: Cardiovascular;  Laterality: N/A;   COLON RESECTION N/A 04/18/2022   Procedure: HAND ASSISTED LAPAROSCOPIC RIGHT COLON RESECTION;  Surgeon: Felicie Morn, MD;  Location: [place];  Service: General;  Laterality: N/A;   COLONOSCOPY WITH PROPOFOL N/A 04/16/2022   Procedure: COLONOSCOPY WITH PROPOFOL;  Surgeon: Yetta Flock, MD;  Location: [place];  Service: Gastroenterology;  Laterality: N/A;   EP IMPLANTABLE DEVICE N/A 12/19/2016   Procedure: ICD Generator Changeout;  Surgeon: [name], MD;  Location: [place] CV LAB;  Service: Cardiovascular;  Laterality: N/A;   ESOPHAGOGASTRODUODENOSCOPY N/A 04/16/2022   Procedure: ESOPHAGOGASTRODUODENOSCOPY (EGD);  Surgeon: Yetta Flock, MD;  Location: [facility] ENDOSCOPY;  Service: Gastroenterology;  Laterality: N/A;   IR ANGIOGRAM SELECTIVE EACH ADDITIONAL VESSEL  05/09/2022   IR ANGIOGRAM VISCERAL SELECTIVE  05/09/2022   IR CATHETER TUBE CHANGE  05/14/2022   IR CATHETER TUBE CHANGE  06/27/2022   IR EMBO ART  VEN HEMORR LYMPH EXTRAV  INC GUIDE ROADMAPPING  05/09/2022   IR IMAGE GUIDED DRAINAGE PERCUT CATH  PERITONEAL RETROPERIT  05/09/2022   IR PATIENT EVAL TECH 0-60 MINS  06/11/2022   IR RADIOLOGIST EVAL & MGMT  06/26/2022   IR RADIOLOGIST EVAL & MGMT  07/16/2022   IR US GUIDE VASC ACCESS RIGHT  05/09/2022   LEFT HEART CATH AND CORONARY ANGIOGRAPHY N/A 04/17/2022   Procedure: LEFT HEART CATH AND CORONARY ANGIOGRAPHY;  Surgeon: Belva Crome, MD;  Location: [place] CV LAB;  Service: Cardiovascular;  Laterality: N/A;   POLYPECTOMY  04/16/2022   Procedure: POLYPECTOMY;  Surgeon: Yetta Flock, MD;  Location: [facility] ENDOSCOPY;  Service: Gastroenterology;;   RIGHT/LEFT HEART CATH AND CORONARY ANGIOGRAPHY N/A 09/24/2020   Procedure: RIGHT/LEFT HEART CATH AND CORONARY ANGIOGRAPHY;  Surgeon: Lorretta Harp, MD;  Location: [place] [facility];  Service: Cardiovascular;  Laterality:  N/A;   SUBMUCOSAL TATTOO INJECTION  04/16/2022   Procedure: SUBMUCOSAL TATTOO INJECTION;  Surgeon: Yetta Flock, MD;  Location: [facility] ENDOSCOPY;  Service: Gastroenterology;;   WRIST TENODESIS      I have reviewed the social history and family history with the patient and they are unchanged from previous note.  ALLERGIES:  has No Known Allergies.  MEDICATIONS:  Current Outpatient Medications  Medication Sig Dispense Refill   acetaminophen (TYLENOL) 325 MG tablet Take 1-2 tablets (325-650 mg total) by mouth every 4 (four) hours as needed for mild pain.     ALPRAZolam (XANAX) 0.25 MG tablet Take 1 tablet (0.25 mg total) by mouth at bedtime as needed for anxiety or sleep. 30 tablet 0   amiodarone (PACERONE) 400 MG tablet Take 1 tablet (400 mg total) by mouth 2 (two) times daily. 60 tablet 0   atorvastatin (LIPITOR) 80 MG tablet Take 1 tablet (80 mg total) by mouth every evening. 90 tablet 3   capecitabine (XELODA) 500 MG tablet Take 4 tablets every 12 hours for 14 days on and 7 days off, take after a meal. 112  tablet 0   clopidogrel (PLAVIX) 75 MG tablet Take 1 tablet (75 mg total) by mouth daily. 90 tablet 3   dapagliflozin propanediol (FARXIGA) 10 MG TABS tablet Take 1 tablet (10 mg total) by mouth daily. 90 tablet 3   diclofenac Sodium (VOLTAREN) 1 % GEL Apply 2 g topically 4 (four) times daily.     docusate sodium (COLACE) 100 MG capsule Take 1 capsule (100 mg total) by mouth 2 (two) times daily. 10 capsule 0   ELIQUIS 5 MG TABS tablet TAKE 1 TABLET(5 MG) BY MOUTH TWICE DAILY 180 tablet 1   ezetimibe (ZETIA) 10 MG tablet Take 1 tablet (10 mg total) by mouth daily. 30 tablet 0   feeding supplement (ENSURE ENLIVE / ENSURE PLUS) LIQD Take 237 mLs by mouth 2 (two) times daily between meals. 237 mL 12   furosemide (LASIX) 40 MG tablet Take 1 tablet (40 mg total) by mouth daily. 30 tablet 0   levETIRAcetam (KEPPRA) 500 MG tablet Take 1 tablet twice a day 180 tablet 3   methocarbamol (ROBAXIN)  500 MG tablet Take 1 tablet (500 mg total) by mouth every 6 (six) hours as needed for muscle spasms. 30 tablet 0   metoprolol succinate (TOPROL-XL) 50 MG 24 hr tablet Take 1 tablet (50 mg total) by mouth 2 (two) times daily. Take with or immediately following a meal. 180 tablet 3   mexiletine (MEXITIL) 150 MG capsule Take 1 capsule (150 mg total) by mouth every 8 (eight) hours. 30 capsule 0   nystatin (MYCOSTATIN/NYSTOP) powder Apply topically 2 (two) times daily. Apply To groin area 15 g 0   oxyCODONE-acetaminophen (PERCOCET/ROXICET) 5-325 MG tablet Take 1 tablet by mouth every 6 (six) hours as needed for moderate pain or severe pain. 21 tablet 0   polyethylene glycol (MIRALAX / GLYCOLAX) 17 g packet Take 17 g by mouth 2 (two) times daily. 14 each 0   potassium chloride (KLOR-CON M) 10 MEQ tablet Take 1 tablet (10 mEq total) by mouth daily. 30 tablet 6   sacubitril-valsartan (ENTRESTO) 97-103 MG Take 1 tablet by mouth 2 (two) times daily. 180 tablet 3   sildenafil (VIAGRA) 50 MG tablet Take 1 tablet (50 mg total) by mouth as needed for erectile dysfunction. 30 tablet 4   traZODone (DESYREL) 100 MG tablet Take 1 tablet (100 mg total) by mouth at bedtime. 90 tablet 3   No current facility-administered medications for this visit.    PHYSICAL EXAMINATION: ECOG PERFORMANCE STATUS: 2 - Symptomatic, <50% confined to bed  There were no vitals filed for this visit. Wt Readings from Last 3 Encounters:  08/05/22 191 lb 3.2 oz (86.7 kg)  07/17/22 198 lb 7 oz (90 kg)  06/25/22 191 lb 11.2 oz (87 kg)     GENERAL:alert, no distress and comfortable SKIN: skin color, texture, turgor are normal, no rashes or significant lesions EYES: normal, Conjunctiva are pink and non-injected, sclera clear NECK: supple, thyroid normal size, non-tender, without nodularity LYMPH:  no palpable lymphadenopathy in the cervical, axillary  LUNGS: clear to auscultation and percussion with normal breathing effort HEART:  regular rate & rhythm and no murmurs and no lower extremity edema ABDOMEN:abdomen soft, non-tender and normal bowel sounds Musculoskeletal:no cyanosis of digits and no clubbing  NEURO: alert & oriented x 3 with fluent speech, no focal motor/sensory deficits  LABORATORY DATA:  I have reviewed the data as listed    Latest Ref Rng & Units 08/05/2022   12:58 PM 07/17/2022  8:15 AM 06/25/2022   11:06 AM  CBC  WBC 4.0 - 10.5 K/uL 7.9  6.9  8.2   Hemoglobin 13.0 - 17.0 g/dL 15.8  14.5  13.7   Hematocrit 39.0 - 52.0 % 47.6  45.4  44.1   Platelets 150 - 400 K/uL 215  203  280         Latest Ref Rng & Units 08/05/2022   12:58 PM 07/17/2022    8:15 AM 06/25/2022   11:06 AM  CMP  Glucose 70 - 99 mg/dL 99  100  92   BUN 8 - 23 mg/dL _0 Creatinine 0.61 - 1.24 mg/dL 0.99  0.85  0.78   Sodium 135 - 145 mmol/L 140  137  137   Potassium 3.5 - 5.1 mmol/L 3.9  4.3  4.3   Chloride 98 - 111 mmol/L 101  102  102   CO2 22 - 32 mmol/L 35  30  30   Calcium 8.9 - 10.3 mg/dL 9.5  8.6  9.0   Total Protein 6.5 - 8.1 g/dL 7.8  7.3  7.8   Total Bilirubin 0.3 - 1.2 mg/dL 0.4  0.3  0.3   Alkaline Phos 38 - 126 U/L 137  158  157   AST 15 - 41 U/L 25  27  39   ALT 0 - 44 U/L 52  52  59       RADIOGRAPHIC STUDIES: I have personally reviewed the radiological images as listed and agreed with the findings in the report. No results found.    No orders of the defined types were placed in this encounter.  All questions were answered. The patient knows to call the clinic with any problems, questions or concerns. No barriers to learning was detected. The total time spent in the appointment was 30 minutes.     Gregory Merle, MD 08/29/2022   I, Wilburn Mylar, am acting as scribe for Gregory Merle, MD.   I have reviewed the above documentation for accuracy and completeness, and I agree with the above.

## 2022-08-30 ENCOUNTER — Other Ambulatory Visit: Payer: Self-pay

## 2022-08-30 ENCOUNTER — Inpatient Hospital Stay (HOSPITAL_COMMUNITY): Payer: Commercial Managed Care - HMO

## 2022-08-30 ENCOUNTER — Emergency Department (HOSPITAL_COMMUNITY): Payer: Commercial Managed Care - HMO

## 2022-08-30 ENCOUNTER — Inpatient Hospital Stay: Payer: Self-pay

## 2022-08-30 ENCOUNTER — Inpatient Hospital Stay (HOSPITAL_COMMUNITY)
Admission: EM | Admit: 2022-08-30 | Discharge: 2022-09-11 | DRG: 208 | Disposition: A | Discharge status: Rehabilitation Facility | Payer: Commercial Managed Care - HMO | Attending: Family Medicine | Admitting: Family Medicine

## 2022-08-30 DIAGNOSIS — I468 Cardiac arrest due to other underlying condition: Secondary | ICD-10-CM | POA: Diagnosis present

## 2022-08-30 DIAGNOSIS — G40101 Localization-related (focal) (partial) symptomatic epilepsy and epileptic syndromes with simple partial seizures, not intractable, with status epilepticus: Secondary | ICD-10-CM | POA: Diagnosis present

## 2022-08-30 DIAGNOSIS — I6523 Occlusion and stenosis of bilateral carotid arteries: Secondary | ICD-10-CM | POA: Diagnosis present

## 2022-08-30 DIAGNOSIS — M96A3 Multiple fractures of ribs associated with chest compression and cardiopulmonary resuscitation: Secondary | ICD-10-CM | POA: Diagnosis present

## 2022-08-30 DIAGNOSIS — Z6826 Body mass index (BMI) 26.0-26.9, adult: Secondary | ICD-10-CM

## 2022-08-30 DIAGNOSIS — F419 Anxiety disorder, unspecified: Secondary | ICD-10-CM | POA: Diagnosis not present

## 2022-08-30 DIAGNOSIS — Z9049 Acquired absence of other specified parts of digestive tract: Secondary | ICD-10-CM

## 2022-08-30 DIAGNOSIS — C787 Secondary malignant neoplasm of liver and intrahepatic bile duct: Secondary | ICD-10-CM | POA: Diagnosis present

## 2022-08-30 DIAGNOSIS — Z79899 Other long term (current) drug therapy: Secondary | ICD-10-CM

## 2022-08-30 DIAGNOSIS — I5042 Chronic combined systolic (congestive) and diastolic (congestive) heart failure: Secondary | ICD-10-CM | POA: Diagnosis present

## 2022-08-30 DIAGNOSIS — Y92009 Unspecified place in unspecified non-institutional (private) residence as the place of occurrence of the external cause: Secondary | ICD-10-CM

## 2022-08-30 DIAGNOSIS — I4821 Permanent atrial fibrillation: Secondary | ICD-10-CM | POA: Diagnosis present

## 2022-08-30 DIAGNOSIS — Z95810 Presence of automatic (implantable) cardiac defibrillator: Secondary | ICD-10-CM | POA: Diagnosis not present

## 2022-08-30 DIAGNOSIS — E119 Type 2 diabetes mellitus without complications: Secondary | ICD-10-CM | POA: Diagnosis present

## 2022-08-30 DIAGNOSIS — E785 Hyperlipidemia, unspecified: Secondary | ICD-10-CM | POA: Diagnosis present

## 2022-08-30 DIAGNOSIS — I469 Cardiac arrest, cause unspecified: Secondary | ICD-10-CM | POA: Diagnosis not present

## 2022-08-30 DIAGNOSIS — Z7901 Long term (current) use of anticoagulants: Secondary | ICD-10-CM

## 2022-08-30 DIAGNOSIS — G40901 Epilepsy, unspecified, not intractable, with status epilepticus: Secondary | ICD-10-CM | POA: Insufficient documentation

## 2022-08-30 DIAGNOSIS — E861 Hypovolemia: Secondary | ICD-10-CM | POA: Diagnosis present

## 2022-08-30 DIAGNOSIS — Z87891 Personal history of nicotine dependence: Secondary | ICD-10-CM

## 2022-08-30 DIAGNOSIS — R569 Unspecified convulsions: Secondary | ICD-10-CM | POA: Diagnosis not present

## 2022-08-30 DIAGNOSIS — I493 Ventricular premature depolarization: Secondary | ICD-10-CM | POA: Diagnosis not present

## 2022-08-30 DIAGNOSIS — D6869 Other thrombophilia: Secondary | ICD-10-CM | POA: Diagnosis present

## 2022-08-30 DIAGNOSIS — E43 Unspecified severe protein-calorie malnutrition: Secondary | ICD-10-CM | POA: Insufficient documentation

## 2022-08-30 DIAGNOSIS — A419 Sepsis, unspecified organism: Secondary | ICD-10-CM | POA: Diagnosis present

## 2022-08-30 DIAGNOSIS — G4730 Sleep apnea, unspecified: Secondary | ICD-10-CM | POA: Diagnosis present

## 2022-08-30 DIAGNOSIS — I2510 Atherosclerotic heart disease of native coronary artery without angina pectoris: Secondary | ICD-10-CM | POA: Diagnosis present

## 2022-08-30 DIAGNOSIS — R5381 Other malaise: Secondary | ICD-10-CM | POA: Diagnosis present

## 2022-08-30 DIAGNOSIS — Z85038 Personal history of other malignant neoplasm of large intestine: Secondary | ICD-10-CM

## 2022-08-30 DIAGNOSIS — D649 Anemia, unspecified: Secondary | ICD-10-CM | POA: Diagnosis not present

## 2022-08-30 DIAGNOSIS — E876 Hypokalemia: Secondary | ICD-10-CM | POA: Diagnosis present

## 2022-08-30 DIAGNOSIS — G931 Anoxic brain damage, not elsewhere classified: Secondary | ICD-10-CM | POA: Diagnosis present

## 2022-08-30 DIAGNOSIS — T17908A Unspecified foreign body in respiratory tract, part unspecified causing other injury, initial encounter: Principal | ICD-10-CM | POA: Diagnosis present

## 2022-08-30 DIAGNOSIS — R253 Fasciculation: Secondary | ICD-10-CM | POA: Diagnosis not present

## 2022-08-30 DIAGNOSIS — Z808 Family history of malignant neoplasm of other organs or systems: Secondary | ICD-10-CM

## 2022-08-30 DIAGNOSIS — R4182 Altered mental status, unspecified: Principal | ICD-10-CM

## 2022-08-30 DIAGNOSIS — K8020 Calculus of gallbladder without cholecystitis without obstruction: Secondary | ICD-10-CM | POA: Diagnosis present

## 2022-08-30 DIAGNOSIS — I4891 Unspecified atrial fibrillation: Secondary | ICD-10-CM | POA: Diagnosis not present

## 2022-08-30 DIAGNOSIS — R6521 Severe sepsis with septic shock: Secondary | ICD-10-CM | POA: Diagnosis present

## 2022-08-30 DIAGNOSIS — I255 Ischemic cardiomyopathy: Secondary | ICD-10-CM | POA: Diagnosis present

## 2022-08-30 DIAGNOSIS — Z8249 Family history of ischemic heart disease and other diseases of the circulatory system: Secondary | ICD-10-CM

## 2022-08-30 DIAGNOSIS — Z66 Do not resuscitate: Secondary | ICD-10-CM | POA: Diagnosis present

## 2022-08-30 DIAGNOSIS — E782 Mixed hyperlipidemia: Secondary | ICD-10-CM | POA: Diagnosis not present

## 2022-08-30 DIAGNOSIS — S2249XD Multiple fractures of ribs, unspecified side, subsequent encounter for fracture with routine healing: Secondary | ICD-10-CM | POA: Diagnosis not present

## 2022-08-30 DIAGNOSIS — J690 Pneumonitis due to inhalation of food and vomit: Secondary | ICD-10-CM | POA: Diagnosis present

## 2022-08-30 DIAGNOSIS — I472 Ventricular tachycardia, unspecified: Secondary | ICD-10-CM | POA: Diagnosis present

## 2022-08-30 DIAGNOSIS — I110 Hypertensive heart disease with heart failure: Secondary | ICD-10-CM | POA: Diagnosis present

## 2022-08-30 DIAGNOSIS — E1165 Type 2 diabetes mellitus with hyperglycemia: Secondary | ICD-10-CM | POA: Diagnosis not present

## 2022-08-30 DIAGNOSIS — R4701 Aphasia: Secondary | ICD-10-CM | POA: Diagnosis present

## 2022-08-30 DIAGNOSIS — J9601 Acute respiratory failure with hypoxia: Secondary | ICD-10-CM | POA: Diagnosis present

## 2022-08-30 DIAGNOSIS — Z7902 Long term (current) use of antithrombotics/antiplatelets: Secondary | ICD-10-CM

## 2022-08-30 DIAGNOSIS — D72829 Elevated white blood cell count, unspecified: Secondary | ICD-10-CM | POA: Diagnosis not present

## 2022-08-30 DIAGNOSIS — G4733 Obstructive sleep apnea (adult) (pediatric): Secondary | ICD-10-CM | POA: Diagnosis present

## 2022-08-30 DIAGNOSIS — L8990 Pressure ulcer of unspecified site, unspecified stage: Secondary | ICD-10-CM | POA: Insufficient documentation

## 2022-08-30 DIAGNOSIS — I25810 Atherosclerosis of coronary artery bypass graft(s) without angina pectoris: Secondary | ICD-10-CM | POA: Diagnosis not present

## 2022-08-30 DIAGNOSIS — I5021 Acute systolic (congestive) heart failure: Secondary | ICD-10-CM | POA: Diagnosis not present

## 2022-08-30 DIAGNOSIS — I253 Aneurysm of heart: Secondary | ICD-10-CM | POA: Diagnosis not present

## 2022-08-30 DIAGNOSIS — E440 Moderate protein-calorie malnutrition: Secondary | ICD-10-CM | POA: Diagnosis not present

## 2022-08-30 LAB — POCT I-STAT 7, (LYTES, BLD GAS, ICA,H+H)
Acid-base deficit: 1 mmol/L (ref 0.0–2.0)
Acid-base deficit: 1 mmol/L (ref 0.0–2.0)
Bicarbonate: 24.2 mmol/L (ref 20.0–28.0)
Bicarbonate: 21 mmol/L (ref 20.0–28.0)
Calcium, Ion: 1.15 mmol/L (ref 1.15–1.40)
Calcium, Ion: 1.09 mmol/L — ABNORMAL LOW (ref 1.15–1.40)
HCT: 49 % (ref 39.0–52.0)
HCT: 47 % (ref 39.0–52.0)
Hemoglobin: 16.7 g/dL (ref 13.0–17.0)
Hemoglobin: 16 g/dL (ref 13.0–17.0)
O2 Saturation: 89 %
O2 Saturation: 99 %
Patient temperature: 98.8
Patient temperature: 99.6
Potassium: 3.2 mmol/L — ABNORMAL LOW (ref 3.5–5.1)
Potassium: 2.9 mmol/L — ABNORMAL LOW (ref 3.5–5.1)
Sodium: 135 mmol/L (ref 135–145)
Sodium: 135 mmol/L (ref 135–145)
TCO2: 25 mmol/L (ref 22–32)
TCO2: 22 mmol/L (ref 22–32)
pCO2 arterial: 42.2 mmHg (ref 32–48)
pCO2 arterial: 28.7 mmHg — ABNORMAL LOW (ref 32–48)
pH, Arterial: 7.367 (ref 7.35–7.45)
pH, Arterial: 7.475 — ABNORMAL HIGH (ref 7.35–7.45)
pO2, Arterial: 59 mmHg — ABNORMAL LOW (ref 83–108)
pO2, Arterial: 117 mmHg — ABNORMAL HIGH (ref 83–108)

## 2022-08-30 LAB — DIFFERENTIAL
Abs Immature Granulocytes: 0.13 10*3/uL — ABNORMAL HIGH (ref 0.00–0.07)
Basophils Absolute: 0 10*3/uL (ref 0.0–0.1)
Basophils Relative: 0 %
Eosinophils Absolute: 0 10*3/uL (ref 0.0–0.5)
Eosinophils Relative: 0 %
Immature Granulocytes: 1 %
Lymphocytes Relative: 7 %
Lymphs Abs: 1.5 10*3/uL (ref 0.7–4.0)
Monocytes Absolute: 1.9 10*3/uL — ABNORMAL HIGH (ref 0.1–1.0)
Monocytes Relative: 8 %
Neutro Abs: 19.7 10*3/uL — ABNORMAL HIGH (ref 1.7–7.7)
Neutrophils Relative %: 84 %

## 2022-08-30 LAB — URINALYSIS, ROUTINE W REFLEX MICROSCOPIC
Bacteria, UA: NONE SEEN
Bilirubin Urine: NEGATIVE
Glucose, UA: NEGATIVE mg/dL
Ketones, ur: NEGATIVE mg/dL
Leukocytes,Ua: NEGATIVE
Nitrite: NEGATIVE
Protein, ur: 100 mg/dL — AB
Specific Gravity, Urine: 1.045 — ABNORMAL HIGH (ref 1.005–1.030)
pH: 6 (ref 5.0–8.0)

## 2022-08-30 LAB — CBC
HCT: 48.5 % (ref 39.0–52.0)
Hemoglobin: 17 g/dL (ref 13.0–17.0)
MCH: 31.9 pg (ref 26.0–34.0)
MCHC: 35.1 g/dL (ref 30.0–36.0)
MCV: 91 fL (ref 80.0–100.0)
Platelets: 156 10*3/uL (ref 150–400)
RBC: 5.33 MIL/uL (ref 4.22–5.81)
RDW: 19.7 % — ABNORMAL HIGH (ref 11.5–15.5)
WBC: 23.3 10*3/uL — ABNORMAL HIGH (ref 4.0–10.5)
nRBC: 0 % (ref 0.0–0.2)

## 2022-08-30 LAB — ECHOCARDIOGRAM LIMITED
S' Lateral: 3.3 cm
Weight: 3231.06 oz

## 2022-08-30 LAB — COMPREHENSIVE METABOLIC PANEL
ALT: 32 U/L (ref 0–44)
AST: 43 U/L — ABNORMAL HIGH (ref 15–41)
Albumin: 3.9 g/dL (ref 3.5–5.0)
Alkaline Phosphatase: 84 U/L (ref 38–126)
Anion gap: 15 (ref 5–15)
BUN: 15 mg/dL (ref 8–23)
CO2: 26 mmol/L (ref 22–32)
Calcium: 9.3 mg/dL (ref 8.9–10.3)
Chloride: 96 mmol/L — ABNORMAL LOW (ref 98–111)
Creatinine, Ser: 1.2 mg/dL (ref 0.61–1.24)
GFR, Estimated: >60 mL/min (ref >60)
Glucose, Bld: 141 mg/dL — ABNORMAL HIGH (ref 70–99)
Potassium: 4 mmol/L (ref 3.5–5.1)
Sodium: 137 mmol/L (ref 135–145)
Total Bilirubin: 0.9 mg/dL (ref 0.3–1.2)
Total Protein: 7.6 g/dL (ref 6.5–8.1)

## 2022-08-30 LAB — PROTIME-INR
INR: 1.6 — ABNORMAL HIGH (ref 0.8–1.2)
Prothrombin Time: 19.2 seconds — ABNORMAL HIGH (ref 11.4–15.2)

## 2022-08-30 LAB — MRSA NEXT GEN BY PCR, NASAL: MRSA by PCR Next Gen: NOT DETECTED

## 2022-08-30 LAB — I-STAT CHEM 8, ED
BUN: 21 mg/dL (ref 8–23)
Calcium, Ion: 0.98 mmol/L — ABNORMAL LOW (ref 1.15–1.40)
Chloride: 97 mmol/L — ABNORMAL LOW (ref 98–111)
Creatinine, Ser: 1 mg/dL (ref 0.61–1.24)
Glucose, Bld: 144 mg/dL — ABNORMAL HIGH (ref 70–99)
HCT: 53 % — ABNORMAL HIGH (ref 39.0–52.0)
Hemoglobin: 18 g/dL — ABNORMAL HIGH (ref 13.0–17.0)
Potassium: 4.4 mmol/L (ref 3.5–5.1)
Sodium: 135 mmol/L (ref 135–145)
TCO2: 31 mmol/L (ref 22–32)

## 2022-08-30 LAB — LACTIC ACID, PLASMA
Lactic Acid, Venous: 6.3 mmol/L (ref 0.5–1.9)
Lactic Acid, Venous: 5 mmol/L (ref 0.5–1.9)

## 2022-08-30 LAB — I-STAT ARTERIAL BLOOD GAS, ED
Acid-Base Excess: 3 mmol/L — ABNORMAL HIGH (ref 0.0–2.0)
Bicarbonate: 28.3 mmol/L — ABNORMAL HIGH (ref 20.0–28.0)
Calcium, Ion: 1.09 mmol/L — ABNORMAL LOW (ref 1.15–1.40)
HCT: 46 % (ref 39.0–52.0)
Hemoglobin: 15.6 g/dL (ref 13.0–17.0)
O2 Saturation: 99 %
Patient temperature: 98.6
Potassium: 2.4 mmol/L — CL (ref 3.5–5.1)
Sodium: 138 mmol/L (ref 135–145)
TCO2: 30 mmol/L (ref 22–32)
pCO2 arterial: 45.4 mmHg (ref 32–48)
pH, Arterial: 7.402 (ref 7.35–7.45)
pO2, Arterial: 132 mmHg — ABNORMAL HIGH (ref 83–108)

## 2022-08-30 LAB — ETHANOL: Alcohol, Ethyl (B): <10 mg/dL (ref <10)

## 2022-08-30 LAB — BASIC METABOLIC PANEL
Anion gap: 14 (ref 5–15)
BUN: 18 mg/dL (ref 8–23)
CO2: 20 mmol/L — ABNORMAL LOW (ref 22–32)
Calcium: 8 mg/dL — ABNORMAL LOW (ref 8.9–10.3)
Chloride: 101 mmol/L (ref 98–111)
Creatinine, Ser: 1.28 mg/dL — ABNORMAL HIGH (ref 0.61–1.24)
GFR, Estimated: >60 mL/min (ref >60)
Glucose, Bld: 193 mg/dL — ABNORMAL HIGH (ref 70–99)
Potassium: 3 mmol/L — ABNORMAL LOW (ref 3.5–5.1)
Sodium: 135 mmol/L (ref 135–145)

## 2022-08-30 LAB — CBG MONITORING, ED: Glucose-Capillary: 158 mg/dL — ABNORMAL HIGH (ref 70–99)

## 2022-08-30 LAB — APTT: aPTT: 33 seconds (ref 24–36)

## 2022-08-30 LAB — TROPONIN I (HIGH SENSITIVITY)
Troponin I (High Sensitivity): 46 ng/L — ABNORMAL HIGH (ref <18)
Troponin I (High Sensitivity): 90 ng/L — ABNORMAL HIGH (ref <18)

## 2022-08-30 LAB — MAGNESIUM: Magnesium: 1.7 mg/dL (ref 1.7–2.4)

## 2022-08-30 LAB — BRAIN NATRIURETIC PEPTIDE: B Natriuretic Peptide: 607 pg/mL — ABNORMAL HIGH (ref 0.0–100.0)

## 2022-08-30 MED ORDER — ORAL CARE MOUTH RINSE
15.0000 mL | OROMUCOSAL | Status: DC
  Administered 2022-08-30 – 2022-09-03 (×42): 15 mL via OROMUCOSAL

## 2022-08-30 MED ORDER — MEXILETINE HCL 150 MG PO CAPS
150.0000 mg | ORAL_CAPSULE | Freq: Three times a day (TID) | ORAL | Status: DC
  Administered 2022-08-30 – 2022-09-03 (×11): 150 mg
  Filled 2022-08-30 (×14): qty 1

## 2022-08-30 MED ORDER — MAGNESIUM SULFATE 2 GM/50ML IV SOLN
2.0000 g | Freq: Once | INTRAVENOUS | Status: AC
  Administered 2022-08-30: 2 g via INTRAVENOUS
  Filled 2022-08-30: qty 50

## 2022-08-30 MED ORDER — SODIUM CHLORIDE 0.9 % IV SOLN
3.0000 g | Freq: Four times a day (QID) | INTRAVENOUS | Status: DC
  Administered 2022-08-30 – 2022-09-04 (×19): 3 g via INTRAVENOUS
  Filled 2022-08-30 (×19): qty 8

## 2022-08-30 MED ORDER — DOCUSATE SODIUM 100 MG PO CAPS: 100.0000 mg | ORAL_CAPSULE | Freq: Two times a day (BID) | ORAL | Status: DC | PRN

## 2022-08-30 MED ORDER — FENTANYL CITRATE PF 50 MCG/ML IJ SOSY
100.0000 ug | PREFILLED_SYRINGE | INTRAMUSCULAR | Status: DC | PRN
  Administered 2022-09-02: 100 ug via INTRAVENOUS
  Filled 2022-08-30: qty 2

## 2022-08-30 MED ORDER — IOHEXOL 350 MG/ML SOLN
75.0000 mL | Freq: Once | INTRAVENOUS | Status: AC | PRN
  Administered 2022-08-30: 75 mL via INTRAVENOUS

## 2022-08-30 MED ORDER — LORAZEPAM 2 MG/ML IJ SOLN
INTRAMUSCULAR | Status: AC
  Filled 2022-08-30: qty 1

## 2022-08-30 MED ORDER — APIXABAN 5 MG PO TABS: 5.0000 mg | ORAL_TABLET | Freq: Two times a day (BID) | ORAL | Status: DC

## 2022-08-30 MED ORDER — ORAL CARE MOUTH RINSE: 15.0000 mL | OROMUCOSAL | Status: DC | PRN

## 2022-08-30 MED ORDER — ETOMIDATE 2 MG/ML IV SOLN
INTRAVENOUS | Status: AC | PRN
  Administered 2022-08-30: 20 mg via INTRAVENOUS

## 2022-08-30 MED ORDER — SODIUM CHLORIDE 0.9 % IV SOLN
4500.0000 mg | Freq: Once | INTRAVENOUS | Status: AC
  Administered 2022-08-30: 4500 mg via INTRAVENOUS
  Filled 2022-08-30: qty 45

## 2022-08-30 MED ORDER — SODIUM BICARBONATE 8.4 % IV SOLN
INTRAVENOUS | Status: AC | PRN
  Administered 2022-08-30: 50 meq via INTRAVENOUS

## 2022-08-30 MED ORDER — POTASSIUM CHLORIDE 10 MEQ/50ML IV SOLN
10.0000 meq | INTRAVENOUS | Status: AC
  Administered 2022-08-30 – 2022-08-31 (×6): 10 meq via INTRAVENOUS
  Filled 2022-08-30 (×6): qty 50

## 2022-08-30 MED ORDER — CHLORHEXIDINE GLUCONATE CLOTH 2 % EX PADS
6.0000 | MEDICATED_PAD | Freq: Every day | CUTANEOUS | Status: DC
  Administered 2022-08-31 – 2022-09-04 (×5): 6 via TOPICAL

## 2022-08-30 MED ORDER — NOREPINEPHRINE 4 MG/250ML-% IV SOLN
0.0000 ug/min | INTRAVENOUS | Status: DC
  Administered 2022-08-30: 10 ug/min via INTRAVENOUS
  Administered 2022-08-30: 33 ug/min via INTRAVENOUS
  Administered 2022-08-30: 40 ug/min via INTRAVENOUS
  Administered 2022-08-30: 15 ug/min via INTRAVENOUS
  Filled 2022-08-30 (×6): qty 250

## 2022-08-30 MED ORDER — LORAZEPAM 2 MG/ML IJ SOLN
4.0000 mg | INTRAMUSCULAR | Status: DC | PRN
  Administered 2022-08-30 (×3): 4 mg via INTRAVENOUS
  Filled 2022-08-30 (×4): qty 2

## 2022-08-30 MED ORDER — SODIUM CHLORIDE 0.9% FLUSH
10.0000 mL | Freq: Two times a day (BID) | INTRAVENOUS | Status: DC
  Administered 2022-08-31 – 2022-09-04 (×9): 10 mL

## 2022-08-30 MED ORDER — SODIUM CHLORIDE 0.9% FLUSH
3.0000 mL | Freq: Once | INTRAVENOUS | Status: AC
  Administered 2022-08-30: 3 mL via INTRAVENOUS

## 2022-08-30 MED ORDER — ACETAMINOPHEN 325 MG PO TABS
650.0000 mg | ORAL_TABLET | ORAL | Status: DC | PRN
  Administered 2022-08-30 – 2022-09-02 (×2): 650 mg
  Filled 2022-08-30 (×2): qty 2

## 2022-08-30 MED ORDER — HEPARIN (PORCINE) 25000 UT/250ML-% IV SOLN
1350.0000 [IU]/h | INTRAVENOUS | Status: DC
  Administered 2022-08-30: 1200 [IU]/h via INTRAVENOUS
  Administered 2022-08-31: 900 [IU]/h via INTRAVENOUS
  Administered 2022-09-01: 1150 [IU]/h via INTRAVENOUS
  Administered 2022-09-02: 1250 [IU]/h via INTRAVENOUS
  Administered 2022-09-03: 1350 [IU]/h via INTRAVENOUS
  Filled 2022-08-30 (×5): qty 250

## 2022-08-30 MED ORDER — FENTANYL BOLUS VIA INFUSION
50.0000 ug | INTRAVENOUS | Status: DC | PRN
  Administered 2022-08-30 – 2022-09-01 (×7): 100 ug via INTRAVENOUS
  Administered 2022-09-03: 50 ug via INTRAVENOUS

## 2022-08-30 MED ORDER — SODIUM CHLORIDE 0.9 % IV BOLUS
500.0000 mL | Freq: Once | INTRAVENOUS | Status: AC
  Administered 2022-08-30: 500 mL via INTRAVENOUS

## 2022-08-30 MED ORDER — ROCURONIUM BROMIDE 50 MG/5ML IV SOLN
INTRAVENOUS | Status: AC | PRN
  Administered 2022-08-30: 100 mg via INTRAVENOUS

## 2022-08-30 MED ORDER — FENTANYL 2500MCG IN NS 250ML (10MCG/ML) PREMIX INFUSION
50.0000 ug/h | INTRAVENOUS | Status: DC
  Administered 2022-08-30: 50 ug/h via INTRAVENOUS
  Administered 2022-08-31: 100 ug/h via INTRAVENOUS
  Administered 2022-09-01 (×2): 200 ug/h via INTRAVENOUS
  Administered 2022-09-02: 50 ug/h via INTRAVENOUS
  Filled 2022-08-30 (×5): qty 250

## 2022-08-30 MED ORDER — AMIODARONE HCL 200 MG PO TABS
400.0000 mg | ORAL_TABLET | Freq: Two times a day (BID) | ORAL | Status: DC
  Administered 2022-08-30 – 2022-09-02 (×7): 400 mg
  Filled 2022-08-30 (×7): qty 2

## 2022-08-30 MED ORDER — FENTANYL CITRATE PF 50 MCG/ML IJ SOSY
100.0000 ug | PREFILLED_SYRINGE | INTRAMUSCULAR | Status: DC | PRN
  Administered 2022-08-31: 100 ug via INTRAVENOUS
  Filled 2022-08-30: qty 2

## 2022-08-30 MED ORDER — CHLORHEXIDINE GLUCONATE CLOTH 2 % EX PADS
6.0000 | MEDICATED_PAD | Freq: Every day | CUTANEOUS | Status: DC
  Administered 2022-08-30 – 2022-09-02 (×6): 6 via TOPICAL

## 2022-08-30 MED ORDER — NOREPINEPHRINE 4 MG/250ML-% IV SOLN
INTRAVENOUS | Status: AC
  Filled 2022-08-30: qty 250

## 2022-08-30 MED ORDER — LACTATED RINGERS IV SOLN: INTRAVENOUS | Status: DC

## 2022-08-30 MED ORDER — MIDAZOLAM HCL 2 MG/2ML IJ SOLN
2.0000 mg | INTRAMUSCULAR | Status: DC | PRN
  Administered 2022-08-31 – 2022-09-02 (×3): 2 mg via INTRAVENOUS
  Filled 2022-08-30 (×4): qty 2

## 2022-08-30 MED ORDER — PANTOPRAZOLE SODIUM 40 MG IV SOLR
40.0000 mg | Freq: Every day | INTRAVENOUS | Status: DC
  Administered 2022-08-30: 40 mg via INTRAVENOUS
  Filled 2022-08-30: qty 10

## 2022-08-30 MED ORDER — STROKE: EARLY STAGES OF RECOVERY BOOK
Freq: Once | Status: AC
  Filled 2022-08-30: qty 1

## 2022-08-30 MED ORDER — DOCUSATE SODIUM 50 MG/5ML PO LIQD: 100.0000 mg | Freq: Two times a day (BID) | ORAL | Status: DC | PRN

## 2022-08-30 MED ORDER — POLYETHYLENE GLYCOL 3350 17 G PO PACK: 17.0000 g | PACK | Freq: Every day | ORAL | Status: DC | PRN

## 2022-08-30 MED ORDER — LEVETIRACETAM IN NACL 1500 MG/100ML IV SOLN
1500.0000 mg | Freq: Two times a day (BID) | INTRAVENOUS | Status: DC
  Administered 2022-08-31 – 2022-09-03 (×8): 1500 mg via INTRAVENOUS
  Filled 2022-08-30 (×8): qty 100

## 2022-08-30 MED ORDER — SODIUM CHLORIDE 0.9% FLUSH: 10.0000 mL | INTRAVENOUS | Status: DC | PRN

## 2022-08-30 MED ORDER — EPINEPHRINE 1 MG/10ML IJ SOSY
PREFILLED_SYRINGE | INTRAMUSCULAR | Status: AC | PRN
  Administered 2022-08-30 (×2): 1 mg via INTRAVENOUS

## 2022-08-30 MED ORDER — ONDANSETRON HCL 4 MG/2ML IJ SOLN: 4.0000 mg | Freq: Four times a day (QID) | INTRAMUSCULAR | Status: DC | PRN

## 2022-08-30 MED ORDER — PANTOPRAZOLE 2 MG/ML SUSPENSION
40.0000 mg | Freq: Every day | ORAL | Status: DC
  Administered 2022-08-31 – 2022-09-02 (×3): 40 mg
  Filled 2022-08-30 (×6): qty 20

## 2022-08-30 MED ORDER — HEPARIN SODIUM (PORCINE) 5000 UNIT/ML IJ SOLN: 5000.0000 [IU] | Freq: Three times a day (TID) | INTRAMUSCULAR | Status: DC

## 2022-08-30 MED ORDER — LEVETIRACETAM IN NACL 1000 MG/100ML IV SOLN: 1000.0000 mg | Freq: Two times a day (BID) | INTRAVENOUS | Status: DC

## 2022-08-30 MED ORDER — MIDAZOLAM HCL 2 MG/2ML IJ SOLN
2.0000 mg | INTRAMUSCULAR | Status: AC | PRN
  Administered 2022-08-30 (×3): 2 mg via INTRAVENOUS
  Filled 2022-08-30 (×3): qty 2

## 2022-08-30 MED ORDER — POTASSIUM CHLORIDE 10 MEQ/100ML IV SOLN
10.0000 meq | INTRAVENOUS | Status: DC
  Administered 2022-08-30: 10 meq via INTRAVENOUS

## 2022-08-30 MED ORDER — CLOPIDOGREL BISULFATE 75 MG PO TABS
75.0000 mg | ORAL_TABLET | Freq: Every day | ORAL | Status: DC
  Administered 2022-08-31 – 2022-09-02 (×3): 75 mg
  Filled 2022-08-30 (×3): qty 1

## 2022-08-30 MED ORDER — LACTATED RINGERS IV BOLUS
1000.0000 mL | Freq: Once | INTRAVENOUS | Status: AC
  Administered 2022-08-30: 1000 mL via INTRAVENOUS

## 2022-08-30 MED ORDER — PROPOFOL 1000 MG/100ML IV EMUL
0.0000 ug/kg/min | INTRAVENOUS | Status: DC
  Administered 2022-08-30: 10 ug/kg/min via INTRAVENOUS

## 2022-08-30 NOTE — Progress Notes (Signed)
Pt is going to 2H24 and having a central line, I will check back later

## 2022-08-30 NOTE — ED Notes (Signed)
Lactic reported as critical from lab 6.3 Kathrynn Humble and Emilee RN notified

## 2022-08-30 NOTE — Progress Notes (Signed)
Patient transported from ED Room 3 to 1Y78 with no complications noted.

## 2022-08-30 NOTE — ED Notes (Signed)
ERROR CPR stopped 1315

## 2022-08-30 NOTE — Progress Notes (Signed)
LTM EEG hooked up and running - no initial skin breakdown - push button tested - neuro notified. [facility] monitoring.  

## 2022-08-30 NOTE — Progress Notes (Signed)
Pharmacy Antibiotic Note  Gregory [name] is a 61 y.o. male admitted on 08/30/2022 as code stroke, concern for aspiration prior to intubation.  Pharmacy has been consulted for Unasyn dosing.  Plan: Unasyn 3g IV every 6 hours Monitor renal function, clinical progression and LOT  Weight: 91.6 kg (201 lb 15.1 oz)  No data recorded.  Recent Labs  Lab 08/29/22 1450 08/30/22 1231 08/30/22 1427  WBC 14.1* 23.3*  --   CREATININE 1.19 1.00 1.20    Estimated Creatinine Clearance: 81.5 mL/min (by C-G formula based on SCr of 1.2 mg/dL).    No Known Allergies  Bertis Ruddy, PharmD Clinical Pharmacist ED Pharmacist Phone # [phone] 08/30/2022 4:45 PM

## 2022-08-30 NOTE — Progress Notes (Signed)
ANTICOAGULATION CONSULT NOTE - Initial Consult  Pharmacy Consult for heparin Indication: chest pain/ACS  No Known Allergies  Patient Measurements: Weight: 91.6 kg (201 lb 15.1 oz) Heparin Dosing Weight: 89kg  Vital Signs: BP: 121/89 (09/16 1630) Pulse Rate: 69 (09/16 1630)  Labs: Recent Labs    08/29/22 1450 08/30/22 1231 08/30/22 1402 08/30/22 1427  HGB 16.7 18.0*  17.0 15.6  --   HCT 48.8 53.0*  48.5 46.0  --   PLT 146* 156  --   --   APTT  --  33  --   --   LABPROT  --  19.2*  --   --   INR  --  1.6*  --   --   CREATININE 1.19 1.00  --  1.20  TROPONINIHS  --   --   --  46*    Estimated Creatinine Clearance: 81.5 mL/min (by C-G formula based on SCr of 1.2 mg/dL).   Medical History: Past Medical History:  Diagnosis Date   AICD (automatic cardioverter/defibrillator) present 2005   CAD (coronary artery disease) 12/01/2013   Chronic combined systolic and diastolic CHF, NYHA class 1 ([place]) 12/01/2013   Erectile dysfunction 12/01/2013   HTN (hypertension) 12/01/2013   Hyperlipidemia 12/01/2013   Ischemic cardiomyopathy 12/01/2013   Presence of permanent cardiac pacemaker    Sleep apnea     Assessment: Gregory [name] presenting as code stroke, PEA arrest in CT, negative LVO on CTA.  Hx CAD, afib on Eliquis PTA  Goal of Therapy:  Heparin level 0.3-0.7 units/ml aPTT 66-102 seconds Monitor platelets by anticoagulation protocol: Yes   Plan:  Heparin gtt at 1200 units/hr, no bolus F/u 6 hour aPTT  Bertis Ruddy, PharmD Clinical Pharmacist ED Pharmacist Phone # [phone] 08/30/2022 5:06 PM

## 2022-08-30 NOTE — ED Provider Notes (Addendum)
[facility] Provider Note   CSN: [id number] Arrival date & time: 08/30/22  1223  An emergency department physician performed an initial assessment on this suspected stroke patient at 1223.  History  Chief Complaint  Patient presents with   Code Stroke    Gregory [name] is a 61 y.o. male.  HPI     61 year old male with history of stage IV colon cancer, advanced congestive heart failure and V. tach, status post VAD placement, A-fib on Eliquis, seizure disorder that is well controlled, diabetes comes into the ER with chief complaint of acute change in mental status and stroke concerns.  Code stroke was activated on the field.  Level 5 caveat for nonverbal patient.  According to EMS, patient was last normal around 8 or 8:30 AM.  He essentially stopped talking and became nonverbal.  Patient also had seizure-like episode in route to the ER and given Versed.  At baseline patient is AOx3 and ambulatory.   Home Medications Prior to Admission medications   Medication Sig Start Date End Date Taking? Authorizing Provider  acetaminophen (TYLENOL) 325 MG tablet Take 1-2 tablets (325-650 mg total) by mouth every 4 (four) hours as needed for mild pain. 06/06/22   Setzer, Edman Circle, PA-C  ALPRAZolam Duanne Moron) 0.25 MG tablet Take 1 tablet (0.25 mg total) by mouth at bedtime as needed for anxiety or sleep. 08/29/22   [name], MD  amiodarone (PACERONE) 400 MG tablet Take 1 tablet (400 mg total) by mouth 2 (two) times daily. 06/06/22   Setzer, Edman Circle, PA-C  atorvastatin (LIPITOR) 80 MG tablet Take 1 tablet (80 mg total) by mouth every evening. 06/06/22   Setzer, Edman Circle, PA-C  capecitabine (XELODA) 500 MG tablet Take 4 tablets every 12 hours for 14 days on and 7 days off, take after a meal. 08/21/22   [name], MD  clopidogrel (PLAVIX) 75 MG tablet Take 1 tablet (75 mg total) by mouth daily. 08/27/22   Croitoru, [name], MD  dapagliflozin propanediol (FARXIGA) 10 MG TABS  tablet Take 1 tablet (10 mg total) by mouth daily. 06/06/22   Setzer, Edman Circle, PA-C  diclofenac Sodium (VOLTAREN) 1 % GEL Apply 2 g topically 4 (four) times daily. 06/06/22   Setzer, Edman Circle, PA-C  docusate sodium (COLACE) 100 MG capsule Take 1 capsule (100 mg total) by mouth 2 (two) times daily. 06/06/22   Setzer, Katharine Look J, PA-C  ELIQUIS 5 MG TABS tablet TAKE 1 TABLET(5 MG) BY MOUTH TWICE DAILY 07/28/22   Croitoru, [name], MD  ezetimibe (ZETIA) 10 MG tablet Take 1 tablet (10 mg total) by mouth daily. 06/06/22   Setzer, Edman Circle, PA-C  feeding supplement (ENSURE ENLIVE / ENSURE PLUS) LIQD Take 237 mLs by mouth 2 (two) times daily between meals. 06/06/22   Setzer, Edman Circle, PA-C  furosemide (LASIX) 40 MG tablet Take 1 tablet (40 mg total) by mouth daily. 06/06/22   Setzer, Edman Circle, PA-C  levETIRAcetam (KEPPRA) 500 MG tablet Take 1 tablet twice a day 06/20/22   [name], MD  methocarbamol (ROBAXIN) 500 MG tablet Take 1 tablet (500 mg total) by mouth every 6 (six) hours as needed for muscle spasms. 06/06/22   Setzer, Edman Circle, PA-C  metoprolol succinate (TOPROL-XL) 50 MG 24 hr tablet Take 1 tablet (50 mg total) by mouth 2 (two) times daily. Take with or immediately following a meal. 07/22/22   Croitoru, [name], MD  mexiletine (MEXITIL) 150 MG capsule Take 1 capsule (  150 mg total) by mouth every 8 (eight) hours. 06/06/22   Setzer, Edman Circle, PA-C  nystatin (MYCOSTATIN/NYSTOP) powder Apply topically 2 (two) times daily. Apply To groin area 06/06/22   Setzer, Edman Circle, PA-C  oxyCODONE-acetaminophen (PERCOCET/ROXICET) 5-325 MG tablet Take 1 tablet by mouth every 6 (six) hours as needed for moderate pain or severe pain. 06/06/22   Setzer, Edman Circle, PA-C  polyethylene glycol (MIRALAX / GLYCOLAX) 17 g packet Take 17 g by mouth 2 (two) times daily. 06/06/22   Setzer, Edman Circle, PA-C  potassium chloride (KLOR-CON M) 10 MEQ tablet Take 1 tablet (10 mEq total) by mouth daily. 06/06/22   Setzer, Edman Circle, PA-C   sacubitril-valsartan (ENTRESTO) 97-103 MG Take 1 tablet by mouth 2 (two) times daily. 08/06/22   Croitoru, [name], MD  sildenafil (VIAGRA) 50 MG tablet Take 1 tablet (50 mg total) by mouth as needed for erectile dysfunction. 01/25/21   Shirley Friar, PA-C  traZODone (DESYREL) 100 MG tablet Take 1 tablet (100 mg total) by mouth at bedtime. 06/20/22   [name], MD      Allergies    Patient has no known allergies.    Review of Systems   Review of Systems  Unable to perform ROS: Acuity of condition    Physical Exam Updated Vital Signs BP (!) 117/95   Pulse 68   Temp 98.8 F (37.1 C)   Resp 20   Wt 91.6 kg   SpO2 100%   BMI 23.34 kg/m  Physical Exam Vitals and nursing note reviewed.  Constitutional:      Appearance: He is well-developed.     Comments: Unresponsive  HENT:     Head: Atraumatic.  Cardiovascular:     Rate and Rhythm: Normal rate.  Pulmonary:     Effort: Pulmonary effort is normal.     Breath sounds: Rhonchi present.     Comments: Bilateral rhonchorous breath sounds, tachypnea Skin:    General: Skin is warm.     ED Results / Procedures / Treatments   Labs (all labs ordered are listed, but only abnormal results are displayed) Labs Reviewed  PROTIME-INR - Abnormal; Notable for the following components:      Result Value   Prothrombin Time 19.2 (*)    INR 1.6 (*)    All other components within normal limits  CBC - Abnormal; Notable for the following components:   WBC 23.3 (*)    RDW 19.7 (*)    All other components within normal limits  DIFFERENTIAL - Abnormal; Notable for the following components:   Neutro Abs 19.7 (*)    Monocytes Absolute 1.9 (*)    Abs Immature Granulocytes 0.13 (*)    All other components within normal limits  COMPREHENSIVE METABOLIC PANEL - Abnormal; Notable for the following components:   Chloride 96 (*)    Glucose, Bld 141 (*)    AST 43 (*)    All other components within normal limits  BRAIN NATRIURETIC  PEPTIDE - Abnormal; Notable for the following components:   B Natriuretic Peptide 607.0 (*)    All other components within normal limits  LACTIC ACID, PLASMA - Abnormal; Notable for the following components:   Lactic Acid, Venous 6.3 (*)    All other components within normal limits  I-STAT CHEM 8, ED - Abnormal; Notable for the following components:   Chloride 97 (*)    Glucose, Bld 144 (*)    Calcium, Ion 0.98 (*)    Hemoglobin 18.0 (*)  HCT 53.0 (*)    All other components within normal limits  CBG MONITORING, ED - Abnormal; Notable for the following components:   Glucose-Capillary 158 (*)    All other components within normal limits  I-STAT ARTERIAL BLOOD GAS, ED - Abnormal; Notable for the following components:   pO2, Arterial 132 (*)    Bicarbonate 28.3 (*)    Acid-Base Excess 3.0 (*)    Potassium 2.4 (*)    Calcium, Ion 1.09 (*)    All other components within normal limits  POCT I-STAT 7, (LYTES, BLD GAS, ICA,H+H) - Abnormal; Notable for the following components:   pO2, Arterial 59 (*)    Potassium 3.2 (*)    All other components within normal limits  TROPONIN I (HIGH SENSITIVITY) - Abnormal; Notable for the following components:   Troponin I (High Sensitivity) 46 (*)    All other components within normal limits  CULTURE, BLOOD (ROUTINE X 2)  CULTURE, BLOOD (ROUTINE X 2)  CULTURE, RESPIRATORY W GRAM STAIN  MRSA NEXT GEN BY PCR, NASAL  APTT  ETHANOL  MAGNESIUM  BLOOD GAS, ARTERIAL  LACTIC ACID, PLASMA  URINALYSIS, ROUTINE W REFLEX MICROSCOPIC  CBC  COMPREHENSIVE METABOLIC PANEL  HEPARIN LEVEL (UNFRACTIONATED)  APTT  BLOOD GAS, ARTERIAL  TROPONIN I (HIGH SENSITIVITY)    EKG EKG Interpretation  Date/Time:  Saturday August 30 2022 14:08:32 EDT Ventricular Rate:  116 PR Interval:    QRS Duration: 155 QT Interval:  355 QTC Calculation: 465 R Axis:   122 Text Interpretation: Atrial fibrillation Ventricular tachycardia, unsustained Nonspecific  intraventricular conduction delay Anterolateral infarct, age indeterminate likely afib with abberancy Confirmed by Varney Biles [phone]) on 08/30/2022 6:37:18 PM QRS complexes wider than usual   Radiology [place] EKG SITE RITE  Result Date: 08/30/2022 If Site Rite image not attached, placement could not be confirmed due to current cardiac rhythm.  ECHOCARDIOGRAM LIMITED  Result Date: 08/30/2022    ECHOCARDIOGRAM LIMITED REPORT   Patient Name:   [facility] Date of Exam: 08/30/2022 Medical Rec #:  [id number]                 Height:       78.0 in Accession #:    [id number]                Weight:       201.9 lb Date of Birth:  Jan 12, 1961                 BSA:          2.267 m Patient Age:    43 years                  BP:           102/65 mmHg Patient Gender: M                         HR:           70 bpm. Exam Location:  Inpatient Procedure: Limited Echo STAT ECHO Indications:    Cardiac Arrest  History:        Patient has prior history of Echocardiogram examinations, most                 recent 04/07/2022. CAD. Respiratory failure requiring mechanical                 ventilation.  Sonographer:    Merrie Roof RDCS Referring Phys:  GRACE E BOWSER  Sonographer Comments: Technically difficult study due to poor echo windows, suboptimal parasternal window, suboptimal apical window and echo performed with patient supine and on artificial respirator. IMPRESSIONS  1. Limited echo for LVEF  2. Left ventricular ejection fraction, by estimation, is 35 to 40%. The left ventricle has moderately decreased function. The left ventricle demonstrates regional wall motion abnormalities (see scoring diagram/findings for description). There is severe left ventricular hypertrophy. There is severe hypokinesis of the left ventricular, entire anteroseptal wall, anterior wall and apical segment.  3. Right ventricular systolic function is mildly reduced. The right ventricular size is normal.  4. The tricuspid valve is abnormal.  Tricuspid valve regurgitation is mild to moderate. Comparison(s): Prior images unable to be directly viewed, comparison made by report only. Changes from prior study are noted. 04/07/2022: LVEF 35-40%, inferior and lateral hypokinesis. Conclusion(s)/Recommendation(s): Wall motion abnormalities are new compared to prior echo are suggestive of LAD territory ischemia. Critical findings reported to Dr. Valeta Harms and acknowledged at 4:30 pm. FINDINGS  Left Ventricle: Left ventricular ejection fraction, by estimation, is 35 to 40%. The left ventricle has moderately decreased function. The left ventricle demonstrates regional wall motion abnormalities. Severe hypokinesis of the left ventricular, entire  anteroseptal wall, anterior wall and apical segment. The left ventricular internal cavity size was small. There is severe left ventricular hypertrophy. Right Ventricle: The right ventricular size is normal. No increase in right ventricular wall thickness. Right ventricular systolic function is mildly reduced. Tricuspid Valve: The tricuspid valve is abnormal. Tricuspid valve regurgitation is mild to moderate. Venous: IVC assessment for right atrial pressure unable to be performed due to mechanical ventilation. Additional Comments: A device lead is visualized. LEFT VENTRICLE PLAX 2D LVIDd:         4.00 cm LVIDs:         3.30 cm LV PW:         1.70 cm LV IVS:        1.95 cm  IVC IVC diam: 1.80 cm TRICUSPID VALVE TR Peak grad:   29.8 mmHg TR Vmax:        273.00 cm/s Lyman Bishop MD Electronically signed by Lyman Bishop MD Signature Date/Time: 08/30/2022/4:48:31 PM    Final    CT ANGIO HEAD NECK W WO CM W PERF (CODE STROKE)  Result Date: 08/30/2022 CLINICAL DATA:  Acute neuro deficit.  Seizure.  Aphasia. EXAM: CT ANGIOGRAPHY HEAD AND NECK TECHNIQUE: Multidetector CT imaging of the head and neck was performed using the standard protocol during bolus administration of intravenous contrast. Multiplanar CT image reconstructions and  MIPs were obtained to evaluate the vascular anatomy. Carotid stenosis measurements (when applicable) are obtained utilizing NASCET criteria, using the distal internal carotid diameter as the denominator. RADIATION DOSE REDUCTION: This exam was performed according to the departmental dose-optimization program which includes automated exposure control, adjustment of the mA and/or kV according to patient size and/or use of iterative reconstruction technique. CONTRAST:  22m OMNIPAQUE IOHEXOL 350 MG/ML SOLN COMPARISON:  None Available. FINDINGS: Suboptimal vascular opacification. Multiple attempts were made. The patient was unstable during the study. CTA NECK FINDINGS Aortic arch: Limited coverage of the aortic arch. Atherosclerotic calcification aortic arch and proximal great vessels. Proximal great vessels patent without stenosis Right carotid system: Atherosclerotic calcification right common carotid artery, right carotid bifurcation. 70% diameter stenosis proximal right internal carotid artery due to calcific stenosis. Left carotid system: Atherosclerotic calcification in the left common carotid artery and left carotid bifurcation without significant stenosis. Mild atherosclerotic  calcification left internal carotid artery. Vertebral arteries: Severe calcific stenosis proximal right vertebral artery which is then patent to the skull base with scattered calcifications. Left vertebral artery widely patent Skeleton: No acute skeletal abnormality. Other neck: Patient is intubated. Retained secretions in the pharynx. No mass in the neck Upper chest: Chest CT reported separately from today. Review of the MIP images confirms the above findings CTA HEAD FINDINGS Anterior circulation: Atherosclerotic calcification and mild stenosis in the cavernous carotid bilaterally. Anterior and middle cerebral arteries patent bilaterally without stenosis. Hypoplastic right A1 segment Posterior circulation: Severe atherosclerotic calcific  stenosis distal right vertebral artery V4 segment. Faint opacification distal to the stenosis. Left vertebral artery patent to the basilar without significant stenosis. Basilar patent. Mild opacification of PICA bilaterally. Superior cerebellar and posterior cerebral arteries patent bilaterally without stenosis. No aneurysm or large vessel occlusion. Venous sinuses: Limited venous enhancement. Anatomic variants: None Review of the MIP images confirms the above findings IMPRESSION: 1. Technically limited study. The patient was unstable during the study and multiple injections were made with suboptimal arterial enhancement. 2. Negative for intracranial large vessel occlusion 3. Extensive extracranial atherosclerotic calcification. Atherosclerotic aortic arch. 4. 70% diameter stenosis proximal right internal carotid artery. Diffuse atherosclerotic disease left carotid without significant stenosis 5. 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 Left vertebral artery patent without stenosis. Electronically Signed   By: Franchot Gallo M.D.   On: 08/30/2022 14:25   DG Chest Port 1 View  Result Date: 08/30/2022 CLINICAL DATA:  Hypoxia, intubation. EXAM: PORTABLE CHEST 1 VIEW COMPARISON:  Chest radiograph dated 05/08/2022. FINDINGS: The heart is enlarged. A left subclavian approach cardiac device is redemonstrated. Vascular calcifications are seen in the aortic arch. The left costophrenic angle is not imaged. There is mild left basilar atelectasis/airspace disease. The right lung is clear. There is no pleural effusion on the right. There is no pneumothorax on either side. Degenerative changes are seen in the spine. IMPRESSION: Mild left basilar atelectasis/airspace disease. Aortic Atherosclerosis (ICD10-I70.0). Electronically Signed   By: Zerita Boers M.D.   On: 08/30/2022 13:17   CT HEAD CODE STROKE WO CONTRAST  Result Date: 08/30/2022 CLINICAL DATA:  Code stroke. 61 year old male.  Metastatic colon cancer. EXAM: CT HEAD WITHOUT CONTRAST TECHNIQUE: Contiguous axial images were obtained from the base of the skull through the vertex without intravenous contrast. RADIATION DOSE REDUCTION: This exam was performed according to the departmental dose-optimization program which includes automated exposure control, adjustment of the mA and/or kV according to patient size and/or use of iterative reconstruction technique. COMPARISON:  Head CT 09/16/2020. FINDINGS: Brain: No midline shift, mass effect, or evidence of intracranial mass lesion. No acute intracranial hemorrhage identified. Stable cerebral volume since 2021. Patchy and scattered bilateral cerebral white matter hypodensity does not appear significantly changed. Possible chronic lacunar infarct in the left thalamus, stable. Chronic partially empty sella. Vascular: Calcified atherosclerosis at the skull base. Difficult a to exclude a hyperdense left MCA branch in the left sylvian fissure which is new since 2021 (series 2, images 13 and 16. No cytotoxic edema identified. Skull: Stable.  No acute osseous abnormality identified. Sinuses/Orbits: Visualized paranasal sinuses and mastoids are stable and well aerated. Other: No acute orbit or scalp soft tissue finding identified. ASPECTS [facility] Stroke Program Early CT Score) Total score (0-10 with 10 being normal): 10 IMPRESSION: 1. Questionable hyperdense Left MCA branch in the sylvian fissure. But no acute intracranial hemorrhage or cortically based infarct identified. ASPECTS 10. 2. Stable non contrast  CT appearance of small vessel disease. 3. These results were communicated to Dr. Quinn Axe at 12:43 pm on 08/30/2022 by text page via the [facility] messaging system. Electronically Signed   By: Genevie Ann M.D.   On: 08/30/2022 12:44    Procedures CPR  Date/Time: 08/30/2022 6:44 PM  Performed by: Varney Biles, MD Authorized by: Varney Biles, MD  CPR Procedure Details:      Amount of time prior to  administration of ACLS/BLS (minutes):  0   ACLS/BLS initiated by EMS: No     CPR/ACLS performed in the ED: Yes     Duration of CPR (minutes):  7   Outcome: ROSC obtained    CPR performed via ACLS guidelines under my direct supervision.  See RN documentation for details including defibrillator use, medications, doses and timing. Procedure Name: Intubation Date/Time: 08/30/2022 6:44 PM  Performed by: Varney Biles, MDPre-anesthesia Checklist: Emergency Drugs available Oxygen Delivery Method: Ambu bag Induction Type: Rapid sequence Ventilation: Mask ventilation without difficulty and Two handed mask ventilation required Laryngoscope Size: Glidescope and 4 Grade View: Grade II Tube size: 7.5 mm Number of attempts: 1 Placement Confirmation: ETT inserted through vocal cords under direct vision, CO2 detector, Breath sounds checked- equal and bilateral and Positive ETCO2 Secured at: 24 cm Tube secured with: ETT holder    .Central Line  Date/Time: 08/30/2022 6:45 PM  Performed by: Varney Biles, MD Authorized by: Varney Biles, MD   Consent:    Consent obtained:  Verbal   Consent given by: Daughter and brother.   Risks, benefits, and alternatives were discussed: yes     Risks discussed:  Arterial puncture, incorrect placement, infection and bleeding   Alternatives discussed:  No treatment Universal protocol:    Procedure explained and questions answered to patient or proxy's satisfaction: yes     Immediately prior to procedure, a time out was called: yes     Patient identity confirmed:  Arm band Pre-procedure details:    Indication(s): central venous access and insufficient peripheral access     Hand hygiene: Hand hygiene performed prior to insertion     Skin preparation:  Chlorhexidine   Skin preparation agent: Skin preparation agent completely dried prior to procedure   Procedure details:    Location:  L femoral   Site selection rationale:  Patient on Eliquis, also needed  arterial line placement   Patient position:  Supine   Procedural supplies:  Triple lumen   Ultrasound guidance: yes     Sterile ultrasound techniques: Sterile gel and sterile probe covers were used     Number of attempts:  2   Successful placement: yes   Post-procedure details:    Post-procedure:  Dressing applied and line sutured   Assessment:  Blood return through all ports   Procedure completion:  Tolerated   Complications:  Hematoma ARTERIAL LINE  Date/Time: 08/30/2022 6:47 PM  Performed by: Varney Biles, MD Authorized by: Varney Biles, MD   Consent:    Consent obtained:  Written   Consent given by: Daughter and brother.   Risks discussed:  Bleeding, repeat procedure, infection and pain Universal protocol:    Procedure explained and questions answered to patient or proxy's satisfaction: yes     Immediately prior to procedure, a time out was called: yes     Patient identity confirmed:  Arm band Indications:    Indications: hemodynamic monitoring and multiple ABGs   Pre-procedure details:    Skin preparation:  Chlorhexidine   Preparation: Patient was prepped  and draped in sterile fashion   Procedure details:    Location:  L femoral   Needle gauge:  20 G   Number of attempts:  2 Post-procedure details:    Post-procedure:  Biopatch applied, secured with tape, sterile dressing applied and sutured   Procedure completion:  Tolerated Procedure Name: Intubation Date/Time: 08/30/2022 6:48 PM  Performed by: Varney Biles, MDTube size: 7.5 mm Number of attempts: 1 Placement Confirmation: CO2 detector, Breath sounds checked- equal and bilateral and Positive ETCO2 Secured at: 25 cm Tube secured with: ETT holder Comments: ET tube exchanged over bougie    .Critical Care  Performed by: Varney Biles, MD Authorized by: Varney Biles, MD   Critical care provider statement:    Critical care time (minutes):  31   Critical care was necessary to treat or prevent  imminent or life-threatening deterioration of the following conditions:  Circulatory failure and cardiac failure   Critical care was time spent personally by me on the following activities:  Development of treatment plan with patient or surrogate, discussions with consultants, evaluation of patient's response to treatment, examination of patient, ordering and review of laboratory studies, ordering and review of radiographic studies, ordering and performing treatments and interventions, pulse oximetry, re-evaluation of patient's condition and review of old charts     Medications Ordered in ED Medications  fentaNYL (SUBLIMAZE) injection 100 mcg (has no administration in time range)  fentaNYL (SUBLIMAZE) injection 100 mcg (has no administration in time range)  midazolam (VERSED) injection 2 mg (has no administration in time range)  norepinephrine (LEVOPHED) 4-5 MG/250ML-% infusion SOLN (0 mcg/kg/min  Hold 08/30/22 1447)  norepinephrine (LEVOPHED) '4mg'$  in 2103m (0.016 mg/mL) premix infusion (40 mcg/min Intravenous New Bag/Given 08/30/22 1723)  fentaNYL 25063m in NS 25053m63m61ml) infusion-PREMIX (50 mcg/hr Intravenous New Bag/Given 08/30/22 1439)  fentaNYL (SUBLIMAZE) bolus via infusion 50-100 mcg (100 mcg Intravenous Bolus from Bag 08/30/22 1719)  pantoprazole (PROTONIX) 2 mg/mL oral suspension 40 mg (has no administration in time range)  pantoprazole (PROTONIX) injection 40 mg (has no administration in time range)  lactated ringers infusion (has no administration in time range)  acetaminophen (TYLENOL) tablet 650 mg (has no administration in time range)  polyethylene glycol (MIRALAX / GLYCOLAX) packet 17 g (has no administration in time range)  ondansetron (ZOFRAN) injection 4 mg (has no administration in time range)  docusate (COLACE) 50 MG/5ML liquid 100 mg (has no administration in time range)  magnesium sulfate IVPB 2 g 50 mL (2 g Intravenous New Bag/Given 08/30/22 1839)  amiodarone (PACERONE)  tablet 400 mg (has no administration in time range)  mexiletine (MEXITIL) capsule 150 mg (has no administration in time range)  clopidogrel (PLAVIX) tablet 75 mg (has no administration in time range)  LORazepam (ATIVAN) injection 4 mg (4 mg Intravenous Given 08/30/22 1829)  Ampicillin-Sulbactam (UNASYN) 3 g in sodium chloride 0.9 % 100 mL IVPB (3 g Intravenous New Bag/Given 08/30/22 1847)  heparin ADULT infusion 100 units/mL (25000 units/250mL48m,200 Units/hr Intravenous New Bag/Given 08/30/22 1823)  levETIRAcetam (KEPPRA) IVPB 1500 mg/ 100 mL premix (has no administration in time range)  Chlorhexidine Gluconate Cloth 2 % PADS 6 each (has no administration in time range)  sodium chloride flush (NS) 0.9 % injection 10-40 mL (has no administration in time range)  sodium chloride flush (NS) 0.9 % injection 10-40 mL (has no administration in time range)  Chlorhexidine Gluconate Cloth 2 % PADS 6 each (has no administration in time range)  Oral care mouth rinse (has no  administration in time range)  Oral care mouth rinse (has no administration in time range)  sodium chloride flush (NS) 0.9 % injection 3 mL (3 mLs Intravenous Given 08/30/22 1358)  midazolam (VERSED) injection 2 mg (2 mg Intravenous Given 08/30/22 1722)  levETIRAcetam (KEPPRA) 4,500 mg in sodium chloride 0.9 % 250 mL IVPB (0 mg Intravenous Stopped 08/30/22 1419)  etomidate (AMIDATE) injection (20 mg Intravenous Given 08/30/22 1248)  rocuronium (ZEMURON) injection (100 mg Intravenous Given 08/30/22 1249)  EPINEPHrine (ADRENALIN) 1 MG/10ML injection (1 mg Intravenous Given 08/30/22 1313)  sodium bicarbonate injection (50 mEq Intravenous Given 08/30/22 1313)  sodium chloride 0.9 % bolus 500 mL (0 mLs Intravenous Stopped 08/30/22 1401)  iohexol (OMNIPAQUE) 350 MG/ML injection 75 mL (75 mLs Intravenous Contrast Given 08/30/22 1403)  iohexol (OMNIPAQUE) 350 MG/ML injection 75 mL (75 mLs Intravenous Contrast Given 08/30/22 1410)  lactated ringers bolus  1,000 mL (1,000 mLs Intravenous New Bag/Given 08/30/22 1828)    ED Course/ Medical Decision Making/ A&P                           Medical Decision Making Problems Addressed: Acute alteration in mental status: acute illness or injury that poses a threat to life or bodily functions Acute respiratory failure with hypoxia ([place]): acute illness or injury that poses a threat to life or bodily functions  Amount and/or Complexity of Data Reviewed Labs: ordered. Radiology: ordered.  Risk Prescription drug management. Decision regarding hospitalization.    61 year old male with history of advanced colon cancer, advanced CHF, A-fib, VT status post AICD on Eliquis, diabetes comes in with chief complaint of acute mental status change.  Last seen normal around 8 AM.  Code stroke was activated on the field for acute mental status change.  Patient also received Versed in route because of seizure-like activity.  Patient's airway was cleared at the bridge.  Patient went to the CT scan, I received a phone call from the nurse indicating that patient will be coming to room 3 and is hypoxic.  Upon my assessment, patient was tachypneic, had gurgling sound with respiration and required nonrebreather to maintain O2 sats close to 90%.  Decision was made to proceed with intubation.  Patient was intubated without any difficulty.  Thereafter, I was informed by the RT that there was cuff leak.  We decided to change the ET tube over bougie and there was no complication.  Patient did require PEEP of 8 with FiO2 of 100% on arrival.  Patient then went for CT angiogram of his head and neck and CT perfusion study.  In the CT, patient went into PEA arrest.  He received 2 of epi, bicarb and 2 rounds of CPR and had ROSC.  Nonspecific ST changes seen on the rhythm strip, irregular heartbeat, wide-complex rhythm.  He had a pulse, initial thought was that he had A-fib with aberrancy.  He was also noted to be hypoxic despite  being on the same parameters.  CT angio head and neck and CT angio PE were completed.  CT angio did not reveal any LVO.  Case discussed with neurology and the family.  Patient did not have any code discussion with the family.  At baseline he was ambulatory, AOx3.  Family not comfortable with comfort care at this point.  Patient's BP has dropped in the interim.  After he coded, he was noted to be hypotensive in the CT scanner.  We gave him 500 mcg of  epinephrine there.  Thereafter, norepinephrine drip was also initiated.  Family agreed to central line and arterial line placement, which was completed in the emergency room.  Patient stable for admission.  I have interpreted CT scan of the brain independently.  No evidence of brain bleed.  I have interpreted CT angio chest independently, no saddle pulmonary embolism.  I have independently assessed x-ray, ET tube above the carina.  ABG is reassuring.  Potassium was initiated as it came back at 2.4.  But it appears that the i-STAT lab had mistake.  Repeat potassium on metabolic profile reveals normal K.  Patient has received only 10 mEq of IV potassium.    Final Clinical Impression(s) / ED Diagnoses Final diagnoses:  Acute alteration in mental status  Acute respiratory failure with hypoxia (HCC)  Aspiration pneumonia, unspecified aspiration pneumonia type, unspecified laterality, unspecified part of lung ([place])  Seizures ([place])  Cardiac arrest [facility])    Rx / DC Orders ED Discharge Orders     None         Varney Biles, MD 08/30/22 [place], [place], MD 09/19/22 [phone]

## 2022-08-30 NOTE — Progress Notes (Signed)
eLink Physician-Brief Progress Note Patient Name: Gregory [name] DOB: 1961/11/04 MRN: [id number]   Date of Service  08/30/2022  HPI/Events of Note  Review of abdominal film reveals that advancement of gastric catheter now results in kinking at the proximal side port within the gastric lumen. Nursing able to auscultate air instilled into OGT over the stomach.   eICU Interventions  OK to use OGT for medication administration.      Intervention Category Major Interventions: Other:  Lysle Dingwall 08/30/2022, 11:41 PM

## 2022-08-30 NOTE — ED Triage Notes (Signed)
Patient BIB [facility] from home after daughter called out for patient being aphasic. Per EMS report the patient was also unresponsive, had a gaze to the right side. This was discovered at 0830. Patient started to seize enroute and EMS gave 2.5 mg of versed. Patient activated as a code stroke based off of this information. Patient emergently brought to CT scanner 2 where he was found to be hypoxic w/ an O2 saturation of 66%. Patient placed on 15 L NRB and a plain view CT head was achieved before he was brought back to room 3 for intubation.

## 2022-08-30 NOTE — Progress Notes (Signed)
Patient transported from ED Room 3 to CT. Patient coded in CT, received one round of CPR and was transported back to ED Room 3 after. RT obtained ABG at this time and reported results to MD. Pt tolerating current ventilator changes well at this time.

## 2022-08-30 NOTE — Consult Note (Signed)
Neurology Consultation  Reason for Consult: sudden onset aphasia and seizure Referring Physician: Dr. Kathrynn Humble  CC: none  History is obtained from:EMS, family and chart  HPI: Gregory [name] is a 61 y.o. 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  At 0830 this morning, patient was noted to have become unable to speak.  His family called EMS, and as he was en route to the hospital he developed R gaze deviation.  He was given 2.5 mg of Versed for this.  Upon arrival, he did not respond to voice or touch and had no response to noxious stimuli.  He did have witnessed seizure activity at this time and was loaded with '4500mg'$  Keppra as well as being given IV lorazepam. After being taken to CT for CT head, patient became hypoxic and required intubation and was started on propofol.   After he was intubated, he returned to CT for CTA and became pulseless.  One round of CPR was performed with epinephrine given and ROSC was achieved.  CTA showed no LVO therefore no indication for intervention.    LKW: 0830 TNK given?: no, on Eliquis IR Thrombectomy? No, no LVO Modified Rankin Scale: 2-Slight disability-UNABLE to perform all activities but does not need assistance  ROS:  Unable to obtain due to altered mental status.   Past Medical History:  Diagnosis Date   AICD (automatic cardioverter/defibrillator) present 2005   CAD (coronary artery disease) 12/01/2013   Chronic combined systolic and diastolic CHF, NYHA class 1 ([place]) 12/01/2013   Erectile dysfunction 12/01/2013   HTN (hypertension) 12/01/2013   Hyperlipidemia 12/01/2013   Ischemic cardiomyopathy 12/01/2013   Presence of permanent cardiac pacemaker    Sleep apnea      Family History  Problem Relation Age of  Onset   Heart disease Mother    Brain cancer Father    Hypertension Daughter      Social History:   reports that he quit smoking about 9 years ago. His smoking use included cigarettes. He has a 30.00 pack-year smoking history. He has never used smokeless tobacco. He reports that he does not currently use alcohol. He reports that he does not use drugs.  Medications  Current Facility-Administered Medications:    fentaNYL (SUBLIMAZE) bolus via infusion 50-100 mcg, 50-100 mcg, Intravenous, Q15 min PRN, Kathrynn Humble, Ankit, MD, 100 mcg at 08/30/22 1439   fentaNYL (SUBLIMAZE) injection 100 mcg, 100 mcg, Intravenous, Q15 min PRN, Nanavati, Ankit, MD   fentaNYL (SUBLIMAZE) injection 100 mcg, 100 mcg, Intravenous, Q2H PRN, Nanavati, Ankit, MD   fentaNYL 2514mg in NS 2544m(1048mml) infusion-PREMIX, 50-200 mcg/hr, Intravenous, Continuous, Nanavati, Ankit, MD, Last Rate: 5 mL/hr at 08/30/22 1439, 50 mcg/hr at 08/30/22 1439   midazolam (VERSED) injection 2 mg, 2 mg, Intravenous, Q15 min PRN, NanKathrynn Humblenkit, MD, 2 mg at 08/30/22 1437   midazolam (VERSED) injection 2 mg, 2 mg, Intravenous, Q2H PRN, Nanavati, Ankit, MD   norepinephrine (LEVOPHED) 4-5 MG/250ML-% infusion SOLN, , , , , Held at 08/30/22 1447   norepinephrine (LEVOPHED) '4mg'$  in 250m67m.016 mg/mL) premix infusion, 0-40 mcg/min, Intravenous, Titrated, Nanavati, Ankit, MD, Last Rate: 112.5 mL/hr at 08/30/22 1451, 30 mcg/min at 08/30/22 1451   potassium chloride 10 mEq in 100 mL IVPB, 10 mEq, Intravenous, Q1 Hr x 6, Nanavati,  Ankit, MD, Last Rate: 100 mL/hr at 08/30/22 1419, 10 mEq at 08/30/22 1419   propofol (DIPRIVAN) 1000 MG/100ML infusion, 0-50 mcg/kg/min, Intravenous, Continuous, Nanavati, Ankit, MD, Stopped at 08/30/22 1313  Current Outpatient Medications:    acetaminophen (TYLENOL) 325 MG tablet, Take 1-2 tablets (325-650 mg total) by mouth every 4 (four) hours as needed for mild pain., Disp: , Rfl:    ALPRAZolam (XANAX) 0.25 MG tablet,  Take 1 tablet (0.25 mg total) by mouth at bedtime as needed for anxiety or sleep., Disp: 30 tablet, Rfl: 0   amiodarone (PACERONE) 400 MG tablet, Take 1 tablet (400 mg total) by mouth 2 (two) times daily., Disp: 60 tablet, Rfl: 0   atorvastatin (LIPITOR) 80 MG tablet, Take 1 tablet (80 mg total) by mouth every evening., Disp: 90 tablet, Rfl: 3   capecitabine (XELODA) 500 MG tablet, Take 4 tablets every 12 hours for 14 days on and 7 days off, take after a meal., Disp: 112 tablet, Rfl: 0   clopidogrel (PLAVIX) 75 MG tablet, Take 1 tablet (75 mg total) by mouth daily., Disp: 90 tablet, Rfl: 3   dapagliflozin propanediol (FARXIGA) 10 MG TABS tablet, Take 1 tablet (10 mg total) by mouth daily., Disp: 90 tablet, Rfl: 3   diclofenac Sodium (VOLTAREN) 1 % GEL, Apply 2 g topically 4 (four) times daily., Disp: , Rfl:    docusate sodium (COLACE) 100 MG capsule, Take 1 capsule (100 mg total) by mouth 2 (two) times daily., Disp: 10 capsule, Rfl: 0   ELIQUIS 5 MG TABS tablet, TAKE 1 TABLET(5 MG) BY MOUTH TWICE DAILY, Disp: 180 tablet, Rfl: 1   ezetimibe (ZETIA) 10 MG tablet, Take 1 tablet (10 mg total) by mouth daily., Disp: 30 tablet, Rfl: 0   feeding supplement (ENSURE ENLIVE / ENSURE PLUS) LIQD, Take 237 mLs by mouth 2 (two) times daily between meals., Disp: 237 mL, Rfl: 12   furosemide (LASIX) 40 MG tablet, Take 1 tablet (40 mg total) by mouth daily., Disp: 30 tablet, Rfl: 0   levETIRAcetam (KEPPRA) 500 MG tablet, Take 1 tablet twice a day, Disp: 180 tablet, Rfl: 3   methocarbamol (ROBAXIN) 500 MG tablet, Take 1 tablet (500 mg total) by mouth every 6 (six) hours as needed for muscle spasms., Disp: 30 tablet, Rfl: 0   metoprolol succinate (TOPROL-XL) 50 MG 24 hr tablet, Take 1 tablet (50 mg total) by mouth 2 (two) times daily. Take with or immediately following a meal., Disp: 180 tablet, Rfl: 3   mexiletine (MEXITIL) 150 MG capsule, Take 1 capsule (150 mg total) by mouth every 8 (eight) hours., Disp: 30 capsule,  Rfl: 0   nystatin (MYCOSTATIN/NYSTOP) powder, Apply topically 2 (two) times daily. Apply To groin area, Disp: 15 g, Rfl: 0   oxyCODONE-acetaminophen (PERCOCET/ROXICET) 5-325 MG tablet, Take 1 tablet by mouth every 6 (six) hours as needed for moderate pain or severe pain., Disp: 21 tablet, Rfl: 0   polyethylene glycol (MIRALAX / GLYCOLAX) 17 g packet, Take 17 g by mouth 2 (two) times daily., Disp: 14 each, Rfl: 0   potassium chloride (KLOR-CON M) 10 MEQ tablet, Take 1 tablet (10 mEq total) by mouth daily., Disp: 30 tablet, Rfl: 6   sacubitril-valsartan (ENTRESTO) 97-103 MG, Take 1 tablet by mouth 2 (two) times daily., Disp: 180 tablet, Rfl: 3   sildenafil (VIAGRA) 50 MG tablet, Take 1 tablet (50 mg total) by mouth as needed for erectile dysfunction., Disp: 30 tablet, Rfl: 4   traZODone (DESYREL) 100 MG  tablet, Take 1 tablet (100 mg total) by mouth at bedtime., Disp: 90 tablet, Rfl: 3   Exam: Current vital signs: BP 96/82   Pulse (!) 27   Resp 20   Wt 91.6 kg   SpO2 100%   BMI 23.34 kg/m  Vital signs in last 24 hours: Temp:  [97.5 F (36.4 C)] 97.5 F (36.4 C) (09/15 1603) Pulse Rate:  [27-109] 27 (09/16 1445) Resp:  [11-20] 20 (09/16 1445) BP: (96-161)/(76-108) 96/82 (09/16 1445) SpO2:  [87 %-100 %] 100 % (09/16 1445) FiO2 (%):  [100 %] 100 % (09/16 1410) Weight:  [89.4 kg-91.6 kg] 91.6 kg (09/16 1200)  GENERAL: Intubated and in no acute distress Head: Normocephalic and atraumatic, without obvious abnormality EENT: Normal conjunctivae, moist mucous membranes, ETT in place LUNGS: Respirations synchronous with ventilator CV: very frequent PVCs and short runs of v-tach seen on monitor Extremities: warm, without obvious deformity  NEURO:  Pupils equal and round, right gaze deviation, does not cross midline or blink to threat bilaterally.  No movement of extremities to noxious stimuli  NIHSS: 1a Level of Conscious.: 2 1b LOC Questions: 2 1c LOC Commands: 2 2 Best Gaze: 2 3  Visual: 3 4 Facial Palsy: 0 5a Motor Arm - left: 3 5b Motor Arm - Right: 3 6a Motor Leg - Left: 3 6b Motor Leg - Right: 3 7 Limb Ataxia: 0 8 Sensory: 2 9 Best Language: 3 10 Dysarthria: 3 11 Extinct. and Inatten.: 0 TOTAL: 31   Labs I have reviewed labs in epic and the results pertinent to this consultation are:   CBC    Component Value Date/Time   WBC 23.3 (H) 08/30/2022 1231   RBC 5.33 08/30/2022 1231   HGB 15.6 08/30/2022 1402   HGB 4.1 (LL) 04/11/2022 1219   HCT 46.0 08/30/2022 1402   HCT 43.4 05/05/2022 1628   PLT 156 08/30/2022 1231   PLT 353 04/11/2022 1219   MCV 91.0 08/30/2022 1231   MCV 71 (L) 04/11/2022 1219   MCH 31.9 08/30/2022 1231   MCHC 35.1 08/30/2022 1231   RDW 19.7 (H) 08/30/2022 1231   RDW 18.0 (H) 04/11/2022 1219   LYMPHSABS 1.5 08/30/2022 1231   LYMPHSABS 1.5 04/11/2022 1219   MONOABS 1.9 (H) 08/30/2022 1231   EOSABS 0.0 08/30/2022 1231   EOSABS 0.1 04/11/2022 1219   BASOSABS 0.0 08/30/2022 1231   BASOSABS 0.0 04/11/2022 1219    CMP     Component Value Date/Time   NA 138 08/30/2022 1402   NA 137 04/11/2022 1219   K 2.4 (LL) 08/30/2022 1402   CL 97 (L) 08/30/2022 1231   CO2 27 08/29/2022 1450   GLUCOSE 144 (H) 08/30/2022 1231   BUN 21 08/30/2022 1231   BUN 13 04/11/2022 1219   CREATININE 1.00 08/30/2022 1231   CREATININE 1.08 12/12/2016 1445   CALCIUM 9.1 08/29/2022 1450   PROT 7.5 08/29/2022 1450   PROT 6.5 02/28/2022 1304   ALBUMIN 4.0 08/29/2022 1450   ALBUMIN 4.0 02/28/2022 1304   AST 19 08/29/2022 1450   ALT 25 08/29/2022 1450   ALKPHOS 89 08/29/2022 1450   BILITOT 0.3 08/29/2022 1450   BILITOT 0.2 02/28/2022 1304   GFRNONAA >60 08/29/2022 1450   GFRAA 91 11/15/2020 0959    Lipid Panel     Component Value Date/Time   CHOL 137 12/30/2019 1242   TRIG 78 12/30/2019 1242   HDL 37 (L) 12/30/2019 1242   CHOLHDL 3.7 12/30/2019 1242   CHOLHDL  4.2 05/05/2016 0526   VLDL 11 05/05/2016 0526   LDLCALC 85 12/30/2019 1242    ABG    Component Value Date/Time   PHART 7.402 08/30/2022 1402   PCO2ART 45.4 08/30/2022 1402   PO2ART 132 (H) 08/30/2022 1402   HCO3 28.3 (H) 08/30/2022 1402   TCO2 30 08/30/2022 1402   ACIDBASEDEF 5.0 (H) 04/18/2022 1218   O2SAT 99 08/30/2022 1402      Imaging I have reviewed the images obtained:  CT-scan of the brain: Questionable hyperdense left MCA  CTA head and neck: negative for LVO, 70% stenosis of right ICA, severe calcific stenosis of right vertebral artery  MRI examination of the brain pending  Assessment: 61 year old patient with history of stage IV colon cancer, CHF with 20% EF and ACID, a-fib on Eliquis, HTN and seizures presents with sudden onset aphasia.  He did have a seizure while in the ED and was treated with lorazepam and loading dose of Keppra.  After initial head CT, patient became hypoxic and required intubation.  He then became pulseless prior to CTA and required one round of CPR with epinephrine.  He is currently requiring high doses of norepinephrine to maintain his MAP and is having frequent short runs of v-tach.  CTA shows no LVO.  Patient's condition discussed with family, and they wish for him to be full code at this time.  Family states that patient is ambulatory and fairly independent at baseline.    Impression:Status epilepticus with possible stroke as well as cardiac arrest in patient with multiple comorbidities  Recommendations: - Keppra '1500mg'$  q12h - LTM EEG - MRI brain with and without contrast when stable for further imaging - TTE  - Check A1c and LDL + add statin if LDL >70 - Heparin gtt for a fib - q4 hr neuro checks - STAT head CT for any change in neuro exam - Tele - PT/OT/SLP - Stroke education - Amb referral to neurology upon discharge   - management of cardiac issues per CCM  Pt seen by NP/Neuro and later by MD. Note/plan to be edited by MD as needed.  [place] , MSN, AGACNP-BC [facility] See Amion  for schedule and pager information 08/30/2022 3:02 PM   Neurology Attending Attestation   I examined the patient and discussed plan with Ms. Carron Curie NP. Above note has been edited by me to reflect my findings and recommendations. I was present throughout the stroke code and made all significant decisions and personally reviewed CNS imaging and also discussed CTA results with radiologist by phone.   Patient with known hx epilepsy presented in focal status epilepticus (unknown trigger). Patient has hx a fib on eliquis and is hypercoagulable 2/2 known stage IV colon cancer. While it is possible patient had an acute ischemic stroke precipitating his seizure event today I do not want to hold anticoagulation in this setting without evidence that this is the case. Continue heparin gtt. Will order LTM EEG with MRI compatible leads and get MRI brain wwo contrast when he is stable to undergo further imaging. Increase keppra to '1500mg'$  bid. Titrate versed to clinical and electrographic seizure freedom.    This patient is critically ill and at significant risk of neurological worsening, death and care requires constant monitoring of vital signs, hemodynamics,respiratory and cardiac monitoring, neurological assessment, discussion with family, other specialists and medical decision making of high complexity. I spent 120 minutes of neurocritical care time  in the care  of  this patient. This was time spent independent of any time provided by nurse practitioner or PA.   Su Monks, MD [facility] [phone]   If 7pm- 7am, please page neurology on call as listed in [place].

## 2022-08-30 NOTE — Progress Notes (Signed)
[facility] Progress Note Patient Name: Gregory [name] DOB: January 02, 1961 MRN: [id number]   Date of Service  08/30/2022  HPI/Events of Note  Multiple issues: 1. Nursing request to review CXR for OGT placement. The side port of the enteric tube is above the level of the diaphragm and should be advanced approximately 7 cm. 2. Hypokalemia - K+ - 3.0 and Creatinine = 1.28. 3. ABG on 100%/PRVC 16/TV 700/P 8 = 7.475/28.7/117/21.0.  eICU Interventions  Plan: Advance OGT 7 cm and repeat abdominal film to verify position.  Will replace K+. Wean FiO2 as tolerated. Keep sat >= 92%. Otherwise, continue present ventilator management.      Intervention Category Major Interventions: Other:  Lysle Dingwall 08/30/2022, 9:15 PM

## 2022-08-30 NOTE — Progress Notes (Signed)
  Echocardiogram 2D Echocardiogram has been performed.  Merrie Roof F 08/30/2022, 3:21 PM

## 2022-08-30 NOTE — H&P (Addendum)
NAME:  Gregory [name], MRN:  [id number], DOB:  [birth year], LOS: 0 ADMISSION DATE:  08/30/2022, CONSULTATION DATE:  08/30/22 REFERRING MD:  Kathrynn Humble, CHIEF COMPLAINT:  cardiac arrest   History of Present Illness:  61 yo PMH metastatic colon cancer, HFrEF, VT s/p ICD, Afib, HTN -poorly controlled, DM, CAD, seizure disorder, recent intraabdominal abscess s/p drain presented to ED 08/30/22 with AMS as code stroke. LKN 9/16 0830. There was also concern for possible seizure like activity en route to hospital and pt received 2.'5mg'$  versed for this. Witnessed seizure activity in ED, received 4400 keppra and Ativan.  CT H with L MCA hyperdensity.  Patient decompensated after CT, becoming more hypoxic and was not able to protect his airway. Intubated with etomidate + roc. Taken for CTA Head/neck, but had a PEA arrest requiring about 5 min ACLS.  CTA Head/neck without LVO. R ICA stenosis, R vertebral artery calcific stenosis  CTA chest without evidence of PE. Looks like he aspirated, has rib fx in setting of CPR, small pleural effusion   After ROSC pt has remained unstable, requiring NE. Having runs on VT in ED.   PCCM consulted for admission in this setting   Pertinent  Medical History  Metastatic colon cancer Seizure disorder HTN OSA DM2 Recurrent VT Afib CAD IAA  Chronic pancreatitis  Splenic artery pseudoaneurysm  Significant Hospital Events: Including procedures, antibiotic start and stop dates in addition to other pertinent events   9/16 to ED as Code stroke + concern for seizure. L MCA stroke. Hypoxic, req ETT. Brief PEA arrest. Shock. CCM called to admit   Interim History / Subjective:  On 30 NE 50 fent   Objective   Blood pressure (!) 161/108, pulse (!) 109, resp. rate 20, weight 91.6 kg, SpO2 98 %.    Vent Mode: PRVC FiO2 (%):  [100 %] 100 % Set Rate:  [20 bmp] 20 bmp Vt Set:  [700 mL] 700 mL PEEP:  [8 cmH20] 8 cmH20 Plateau Pressure:  [22 cmH20] 22 cmH20  No  intake or output data in the 24 hours ending 08/30/22 1442 Filed Weights   08/30/22 1200  Weight: 91.6 kg    Examination: General: Critically ill older adult M intubated lightly sedated  HENT: NCAT. ETT secure pink mm  Lungs: Diminished R sided sounds. Mechanically ventilated  Cardiovascular: s1s2 cap refill < 3sec  Abdomen: soft ndnt hypoactive  Extremities: no acute joint deformity  Neuro: Opens eyes to painful stimuli. Moving RUE RLE to pain.  GU: wnl  Resolved Hospital Problem list     Assessment & Plan:   Cardiac arrest  -PEA. About 5 min ACLS. Looks like he aspirated -- ?if catalyst was plugging. Also appears pretty hypovolemic P -supportive care -Bolus 1L LR  Shock P  -NE for MAP > 65 -STAT ECHO  -may need central access if aggressive care continued; with renal fxn could do a PICC  Status epilepticus Possible L MCA CVA  -not a thrombectomy candidate with no LVO -on 500 keppra BID at home P -1g Keppra q12hr -cEEG  -PRN BZD  -seizure precautions -If cardiac device is MRI compatible and aggressive care is continued, MRI brain  Acute respiratory failure with hypoxia  Aspiration PNA P -cont MV support, wean as able -will start Unasyn for aspiration PNA   HFrEF Recurrent VT  Afib on eliquis  CAD  Poorly controlled HTN -on amio, mexitil, eliquis, plavix, metop, lasix, entresto at home P -optimize lytes -cont tele  -ECHO, BNP  pending  -hold antihypertensives with shock  -amio, mexitil -hold eliquis. plavix  Leukocytosis  -intraabdominal abscess a few months ago, drain removed in August  P -Unasyn as above -Bcx, UA, tracheal aspirate  -consider CT a/p if no source   Hypocalcemia Hypomagnesemia Hypokalemia P -replace, trend   Metastatic colon cancer  -functional status and comorbidities (poorly controlled HTN, CHF) limit some chemo options -on Xeloda outpt   Goals of Care -decision makers are son and daughter. I spoke with son and  conveyed recommendation for DNR status, and recommended coming to hospital to discuss further. He will call his sister (who I could not reach via phone)    Best Practice (right click and "Reselect all SmartList Selections" daily)   Diet/type: NPO DVT prophylaxis: prophylactic heparin  GI prophylaxis: PPI Lines: N/A Foley:  N/A Code Status:  full code Last date of multidisciplinary goals of care discussion [pending]  Labs   CBC: Recent Labs  Lab 08/29/22 1450 08/30/22 1231 08/30/22 1402  WBC 14.1* 23.3*  --   NEUTROABS 11.8* 19.7*  --   HGB 16.7 18.0*  17.0 15.6  HCT 48.8 53.0*  48.5 46.0  MCV 91.4 91.0  --   PLT 146* 156  --     Basic Metabolic Panel: Recent Labs  Lab 08/29/22 1450 08/30/22 1231 08/30/22 1402  NA 140 135 138  K 3.7 4.4 2.4*  CL 102 97*  --   CO2 27  --   --   GLUCOSE 112* 144*  --   BUN 16 21  --   CREATININE 1.19 1.00  --   CALCIUM 9.1  --   --    GFR: Estimated Creatinine Clearance: 97.7 mL/min (by C-G formula based on SCr of 1 mg/dL). Recent Labs  Lab 08/29/22 1450 08/30/22 1231  WBC 14.1* 23.3*    Liver Function Tests: Recent Labs  Lab 08/29/22 1450  AST 19  ALT 25  ALKPHOS 89  BILITOT 0.3  PROT 7.5  ALBUMIN 4.0   No results for input(s): "LIPASE", "AMYLASE" in the last 168 hours. No results for input(s): "AMMONIA" in the last 168 hours.  ABG    Component Value Date/Time   PHART 7.402 08/30/2022 1402   PCO2ART 45.4 08/30/2022 1402   PO2ART 132 (H) 08/30/2022 1402   HCO3 28.3 (H) 08/30/2022 1402   TCO2 30 08/30/2022 1402   ACIDBASEDEF 5.0 (H) 04/18/2022 1218   O2SAT 99 08/30/2022 1402     Coagulation Profile: Recent Labs  Lab 08/30/22 1231  INR 1.6*    Cardiac Enzymes: No results for input(s): "CKTOTAL", "CKMB", "CKMBINDEX", "TROPONINI" in the last 168 hours.  HbA1C: Hgb A1c MFr Bld  Date/Time Value Ref Range Status  05/08/2022 04:37 PM 5.1 4.8 - 5.6 % Final    Comment:    (NOTE) Pre diabetes:           5.7%-6.4%  Diabetes:              >6.4%  Glycemic control for   <7.0% adults with diabetes   09/21/2020 11:30 AM 6.9 (H) 4.8 - 5.6 % Final    Comment:    (NOTE) Pre diabetes:          5.7%-6.4%  Diabetes:              >6.4%  Glycemic control for   <7.0% adults with diabetes     CBG: Recent Labs  Lab 08/30/22 1225  GLUCAP 158*    Review of Systems:  Unable to obtain, intubated sedated  Past Medical History:  He,  has a past medical history of AICD (automatic cardioverter/defibrillator) present (2005), CAD (coronary artery disease) (12/01/2013), Chronic combined systolic and diastolic CHF, NYHA class 1 ([place]) (12/01/2013), Erectile dysfunction (12/01/2013), HTN (hypertension) (12/01/2013), Hyperlipidemia (12/01/2013), Ischemic cardiomyopathy (12/01/2013), Presence of permanent cardiac pacemaker, and Sleep apnea.   Surgical History:   Past Surgical History:  Procedure Laterality Date   ACHILLES TENDON REPAIR     lft foot   BIOPSY  04/16/2022   Procedure: BIOPSY;  Surgeon: Yetta Flock, MD;  Location: [facility];  Service: Gastroenterology;;   CARDIAC CATHETERIZATION N/A 05/05/2016   Procedure: Left Heart Cath and Coronary Angiography;  Surgeon: Leonie Man, MD;  Location: [facility];  Service: Cardiovascular;  Laterality: N/A;   CARDIAC DEFIBRILLATOR PLACEMENT     CARDIOVERSION N/A 09/13/2020   Procedure: CARDIOVERSION;  Surgeon: Werner Lean, MD;  Location: [facility];  Service: Cardiovascular;  Laterality: N/A;   CARDIOVERSION N/A 09/19/2020   Procedure: CARDIOVERSION;  Surgeon: [name], MD;  Location: [facility];  Service: Cardiovascular;  Laterality: N/A;   COLON RESECTION N/A 04/18/2022   Procedure: HAND ASSISTED LAPAROSCOPIC RIGHT COLON RESECTION;  Surgeon: Felicie Morn, MD;  Location: [facility];  Service: General;  Laterality: N/A;   COLONOSCOPY WITH PROPOFOL N/A 04/16/2022   Procedure: COLONOSCOPY WITH PROPOFOL;  Surgeon:  Yetta Flock, MD;  Location: [facility];  Service: Gastroenterology;  Laterality: N/A;   EP IMPLANTABLE DEVICE N/A 12/19/2016   Procedure: ICD Generator Changeout;  Surgeon: [name], MD;  Location: [facility];  Service: Cardiovascular;  Laterality: N/A;   ESOPHAGOGASTRODUODENOSCOPY N/A 04/16/2022   Procedure: ESOPHAGOGASTRODUODENOSCOPY (EGD);  Surgeon: Yetta Flock, MD;  Location: [facility];  Service: Gastroenterology;  Laterality: N/A;   IR ANGIOGRAM SELECTIVE EACH ADDITIONAL VESSEL  05/09/2022   IR ANGIOGRAM VISCERAL SELECTIVE  05/09/2022   IR CATHETER TUBE CHANGE  05/14/2022   IR CATHETER TUBE CHANGE  06/27/2022   IR EMBO ART  VEN HEMORR LYMPH EXTRAV  INC GUIDE ROADMAPPING  05/09/2022   IR IMAGE GUIDED DRAINAGE PERCUT CATH  PERITONEAL RETROPERIT  05/09/2022   IR PATIENT EVAL TECH 0-60 MINS  06/11/2022   IR RADIOLOGIST EVAL & MGMT  06/26/2022   IR RADIOLOGIST EVAL & MGMT  07/16/2022   IR US GUIDE VASC ACCESS RIGHT  05/09/2022   LEFT HEART CATH AND CORONARY ANGIOGRAPHY N/A 04/17/2022   Procedure: LEFT HEART CATH AND CORONARY ANGIOGRAPHY;  Surgeon: Belva Crome, MD;  Location: [facility];  Service: Cardiovascular;  Laterality: N/A;   POLYPECTOMY  04/16/2022   Procedure: POLYPECTOMY;  Surgeon: Yetta Flock, MD;  Location: [facility];  Service: Gastroenterology;;   RIGHT/LEFT HEART CATH AND CORONARY ANGIOGRAPHY N/A 09/24/2020   Procedure: RIGHT/LEFT HEART CATH AND CORONARY ANGIOGRAPHY;  Surgeon: Lorretta Harp, MD;  Location: [facility];  Service: Cardiovascular;  Laterality: N/A;   SUBMUCOSAL TATTOO INJECTION  04/16/2022   Procedure: SUBMUCOSAL TATTOO INJECTION;  Surgeon: Yetta Flock, MD;  Location: [facility];  Service: Gastroenterology;;   WRIST TENODESIS       Social History:   reports that he quit smoking about 9 years ago. His smoking use included cigarettes. He has a 30.00 pack-year smoking history. He has never used smokeless  tobacco. He reports that he does not currently use alcohol. He reports that he does not use drugs.   Family History:  His  family history includes Brain cancer in his father; Heart disease in his mother; Hypertension in his daughter.   Allergies No Known Allergies   Home Medications  Prior to Admission medications   Medication Sig Start Date End Date Taking? Authorizing Provider  acetaminophen (TYLENOL) 325 MG tablet Take 1-2 tablets (325-650 mg total) by mouth every 4 (four) hours as needed for mild pain. 06/06/22   Setzer, Edman Circle, PA-C  ALPRAZolam Duanne Moron) 0.25 MG tablet Take 1 tablet (0.25 mg total) by mouth at bedtime as needed for anxiety or sleep. 08/29/22   [name], MD  amiodarone (PACERONE) 400 MG tablet Take 1 tablet (400 mg total) by mouth 2 (two) times daily. 06/06/22   Setzer, Edman Circle, PA-C  atorvastatin (LIPITOR) 80 MG tablet Take 1 tablet (80 mg total) by mouth every evening. 06/06/22   Setzer, Edman Circle, PA-C  capecitabine (XELODA) 500 MG tablet Take 4 tablets every 12 hours for 14 days on and 7 days off, take after a meal. 08/21/22   [name], MD  clopidogrel (PLAVIX) 75 MG tablet Take 1 tablet (75 mg total) by mouth daily. 08/27/22   Croitoru, [name], MD  dapagliflozin propanediol (FARXIGA) 10 MG TABS tablet Take 1 tablet (10 mg total) by mouth daily. 06/06/22   Setzer, Edman Circle, PA-C  diclofenac Sodium (VOLTAREN) 1 % GEL Apply 2 g topically 4 (four) times daily. 06/06/22   Setzer, Edman Circle, PA-C  docusate sodium (COLACE) 100 MG capsule Take 1 capsule (100 mg total) by mouth 2 (two) times daily. 06/06/22   Setzer, Katharine Look J, PA-C  ELIQUIS 5 MG TABS tablet TAKE 1 TABLET(5 MG) BY MOUTH TWICE DAILY 07/28/22   Croitoru, [name], MD  ezetimibe (ZETIA) 10 MG tablet Take 1 tablet (10 mg total) by mouth daily. 06/06/22   Setzer, Edman Circle, PA-C  feeding supplement (ENSURE ENLIVE / ENSURE PLUS) LIQD Take 237 mLs by mouth 2 (two) times daily between meals. 06/06/22   Setzer, Edman Circle, PA-C  furosemide  (LASIX) 40 MG tablet Take 1 tablet (40 mg total) by mouth daily. 06/06/22   Setzer, Edman Circle, PA-C  levETIRAcetam (KEPPRA) 500 MG tablet Take 1 tablet twice a day 06/20/22   [name], MD  methocarbamol (ROBAXIN) 500 MG tablet Take 1 tablet (500 mg total) by mouth every 6 (six) hours as needed for muscle spasms. 06/06/22   Setzer, Edman Circle, PA-C  metoprolol succinate (TOPROL-XL) 50 MG 24 hr tablet Take 1 tablet (50 mg total) by mouth 2 (two) times daily. Take with or immediately following a meal. 07/22/22   Croitoru, [name], MD  mexiletine (MEXITIL) 150 MG capsule Take 1 capsule (150 mg total) by mouth every 8 (eight) hours. 06/06/22   Setzer, Edman Circle, PA-C  nystatin (MYCOSTATIN/NYSTOP) powder Apply topically 2 (two) times daily. Apply To groin area 06/06/22   Setzer, Edman Circle, PA-C  oxyCODONE-acetaminophen (PERCOCET/ROXICET) 5-325 MG tablet Take 1 tablet by mouth every 6 (six) hours as needed for moderate pain or severe pain. 06/06/22   Setzer, Edman Circle, PA-C  polyethylene glycol (MIRALAX / GLYCOLAX) 17 g packet Take 17 g by mouth 2 (two) times daily. 06/06/22   Setzer, Edman Circle, PA-C  potassium chloride (KLOR-CON M) 10 MEQ tablet Take 1 tablet (10 mEq total) by mouth daily. 06/06/22   Setzer, Edman Circle, PA-C  sacubitril-valsartan (ENTRESTO) 97-103 MG Take 1 tablet by mouth 2 (two) times daily. 08/06/22   Croitoru, [name], MD  sildenafil (VIAGRA) 50 MG tablet Take 1  tablet (50 mg total) by mouth as needed for erectile dysfunction. 01/25/21   Shirley Friar, PA-C  traZODone (DESYREL) 100 MG tablet Take 1 tablet (100 mg total) by mouth at bedtime. 06/20/22   [name], MD     Critical care time: 55 min    CRITICAL CARE Performed by: Cristal Generous   Total critical care time: 55 minutes  Critical care time was exclusive of separately billable procedures and treating other patients.  Critical care was necessary to treat or prevent imminent or life-threatening deterioration.  Critical care  was time spent personally by me on the following activities: development of treatment plan with patient and/or surrogate as well as nursing, discussions with consultants, evaluation of patient's response to treatment, examination of patient, obtaining history from patient or surrogate, ordering and performing treatments and interventions, ordering and review of laboratory studies, ordering and review of radiographic studies, pulse oximetry and re-evaluation of patient's condition.   Eliseo Gum MSN, AGACNP-BC [place] for pager  08/30/2022, 4:30 PM

## 2022-08-30 NOTE — ED Notes (Signed)
Attempted to place temp foley, meeting resistance, unable to place.

## 2022-08-31 ENCOUNTER — Inpatient Hospital Stay (HOSPITAL_COMMUNITY): Payer: Commercial Managed Care - HMO

## 2022-08-31 DIAGNOSIS — G40901 Epilepsy, unspecified, not intractable, with status epilepticus: Secondary | ICD-10-CM

## 2022-08-31 DIAGNOSIS — L8990 Pressure ulcer of unspecified site, unspecified stage: Secondary | ICD-10-CM | POA: Insufficient documentation

## 2022-08-31 DIAGNOSIS — I469 Cardiac arrest, cause unspecified: Secondary | ICD-10-CM | POA: Diagnosis not present

## 2022-08-31 DIAGNOSIS — R569 Unspecified convulsions: Secondary | ICD-10-CM | POA: Diagnosis not present

## 2022-08-31 LAB — COMPREHENSIVE METABOLIC PANEL
ALT: 296 U/L — ABNORMAL HIGH (ref 0–44)
AST: 367 U/L — ABNORMAL HIGH (ref 15–41)
Albumin: 2.8 g/dL — ABNORMAL LOW (ref 3.5–5.0)
Alkaline Phosphatase: 75 U/L (ref 38–126)
Anion gap: 12 (ref 5–15)
BUN: 20 mg/dL (ref 8–23)
CO2: 21 mmol/L — ABNORMAL LOW (ref 22–32)
Calcium: 8 mg/dL — ABNORMAL LOW (ref 8.9–10.3)
Chloride: 99 mmol/L (ref 98–111)
Creatinine, Ser: 1.15 mg/dL (ref 0.61–1.24)
GFR, Estimated: >60 mL/min (ref >60)
Glucose, Bld: 144 mg/dL — ABNORMAL HIGH (ref 70–99)
Potassium: 3.8 mmol/L (ref 3.5–5.1)
Sodium: 132 mmol/L — ABNORMAL LOW (ref 135–145)
Total Bilirubin: 0.6 mg/dL (ref 0.3–1.2)
Total Protein: 6 g/dL — ABNORMAL LOW (ref 6.5–8.1)

## 2022-08-31 LAB — BLOOD CULTURE ID PANEL (REFLEXED) - BCID2

## 2022-08-31 LAB — CBC
HCT: 43 % (ref 39.0–52.0)
Hemoglobin: 14.8 g/dL (ref 13.0–17.0)
MCH: 30.8 pg (ref 26.0–34.0)
MCHC: 34.4 g/dL (ref 30.0–36.0)
MCV: 89.6 fL (ref 80.0–100.0)
Platelets: 153 10*3/uL (ref 150–400)
RBC: 4.8 MIL/uL (ref 4.22–5.81)
RDW: 19.8 % — ABNORMAL HIGH (ref 11.5–15.5)
WBC: 16.9 10*3/uL — ABNORMAL HIGH (ref 4.0–10.5)
nRBC: 0.2 % (ref 0.0–0.2)

## 2022-08-31 LAB — LIPID PANEL
Cholesterol: 137 mg/dL (ref 0–200)
HDL: 54 mg/dL (ref >40)
LDL Cholesterol: 73 mg/dL (ref 0–99)
Total CHOL/HDL Ratio: 2.5 RATIO
Triglycerides: 51 mg/dL (ref <150)
VLDL: 10 mg/dL (ref 0–40)

## 2022-08-31 LAB — HEPARIN LEVEL (UNFRACTIONATED): Heparin Unfractionated: >1.1 IU/mL — ABNORMAL HIGH (ref 0.30–0.70)

## 2022-08-31 LAB — APTT
aPTT: 101 seconds — ABNORMAL HIGH (ref 24–36)
aPTT: 103 seconds — ABNORMAL HIGH (ref 24–36)

## 2022-08-31 MED ORDER — NOREPINEPHRINE 16 MG/250ML-% IV SOLN
0.0000 ug/min | INTRAVENOUS | Status: DC
  Administered 2022-08-31: 10 ug/min via INTRAVENOUS
  Administered 2022-08-31: 30 ug/min via INTRAVENOUS
  Administered 2022-09-01: 10 ug/min via INTRAVENOUS
  Administered 2022-09-02: 2 ug/min via INTRAVENOUS
  Filled 2022-08-31 (×4): qty 250

## 2022-08-31 MED ORDER — LACTATED RINGERS IV BOLUS
1000.0000 mL | Freq: Once | INTRAVENOUS | Status: AC
  Administered 2022-08-31: 1000 mL via INTRAVENOUS

## 2022-08-31 MED ORDER — ALBUMIN HUMAN 25 % IV SOLN
25.0000 g | Freq: Once | INTRAVENOUS | Status: AC
  Administered 2022-08-31: 25 g via INTRAVENOUS
  Filled 2022-08-31: qty 100

## 2022-08-31 NOTE — Procedures (Addendum)
Patient Name: Gregory [name]  MRN: [id number]  Epilepsy Attending: Lora Havens  Referring Physician/Provider: Katy Apo, NP Duration: 08/30/2022 1947 to 08/31/2022 1947  Patient history: 61yo M Patient with known hx epilepsy presented in focal status epilepticus. EEG to evaluate for seizure  Level of alertness: lethargic/sedated, asleep  AEDs during EEG study: LEV  Technical aspects: This EEG study was done with scalp electrodes positioned according to the 10-20 International system of electrode placement. Electrical activity was reviewed with band pass filter of 1-'70Hz'$ , sensitivity of 7 uV/mm, display speed of 21m/sec with a '60Hz'$  notched filter applied as appropriate. EEG data were recorded continuously and digitally stored.  Video monitoring was available and reviewed as appropriate.  Description: No posterior dominant rhythm was seen. Sleep was characterized by sleep spindles (12 to 14 Hz), maximal frontocentral region. EEG showed continuous generalized 3 to 6 Hz theta-delta slowing admixed with intermittent generalized 13-'15Hz'$  beta activity.  Event button was pressed on 08/31/2022 at 0215. Per RN, patient had rhythmic eye closing and abdominal movements that were difficult to visualize on camera.  Concomitant EEG before, during and after the event did not show any EEG changes suggest seizure.  Hyperventilation and photic stimulation were not performed.     ABNORMALITY.  - Continuous slow, generalized  IMPRESSION: This study is suggestive of severe diffuse encephalopathy, nonspecific etiology but could be related to sedation. No seizures or epileptiform discharges were seen throughout the recording.  Event button was pressed on 08/31/2022 at 0215. Per RN, patient had rhythmic eye closing and abdominal movements that were difficult to visualize on camera.  Concomitant EEG did not show any EEG changes suggest seizure. This was most likely NOT an epileptic  event.  Gregory [name]

## 2022-08-31 NOTE — Progress Notes (Signed)
SLP Cancellation Note  Patient Details Name: Gregory [name] MRN: [id number] DOB: 1961-04-26   Cancelled treatment:       Reason Eval/Treat Not Completed: Patient not medically ready. Order received for speech/language eval. Pt currently on the vent. Will f/u as able.    Osie Bond., M.A. [place] Office [phone]  Secure chat preferred  08/31/2022, 7:16 AM

## 2022-08-31 NOTE — Progress Notes (Signed)
Pt was transported from 2H26 to CT and back to 0B55 with no complications.

## 2022-08-31 NOTE — Progress Notes (Signed)
Maint completed 

## 2022-08-31 NOTE — H&P (Signed)
NAME:  Gregory [name], MRN:  [id number], DOB:  [birth year], LOS: 1 ADMISSION DATE:  08/30/2022, CONSULTATION DATE:  08/30/22 REFERRING MD:  Kathrynn Humble, CHIEF COMPLAINT:  cardiac arrest   History of Present Illness:  61 yo PMH metastatic colon cancer, HFrEF, VT s/p ICD, Afib, HTN -poorly controlled, DM, CAD, seizure disorder, recent intraabdominal abscess s/p drain presented to ED 08/30/22 with AMS as code stroke. LKN 9/16 0830. There was also concern for possible seizure like activity en route to hospital and pt received 2.'5mg'$  versed for this. Witnessed seizure activity in ED, received 4400 keppra and Ativan.  CT H with L MCA hyperdensity.  Patient decompensated after CT, becoming more hypoxic and was not able to protect his airway. Intubated with etomidate + roc. Taken for CTA Head/neck, but had a PEA arrest requiring about 5 min ACLS.  CTA Head/neck without LVO. R ICA stenosis, R vertebral artery calcific stenosis  CTA chest without evidence of PE. Looks like he aspirated, has rib fx in setting of CPR, small pleural effusion   After ROSC pt has remained unstable, requiring NE. Having runs on VT in ED.   PCCM consulted for admission in this setting   Pertinent  Medical History  Metastatic colon cancer Seizure disorder HTN OSA DM2 Recurrent VT Afib CAD IAA  Chronic pancreatitis  Splenic artery pseudoaneurysm  Significant Hospital Events: Including procedures, antibiotic start and stop dates in addition to other pertinent events   9/16 to ED as Code stroke + concern for seizure. L MCA stroke. Hypoxic, req ETT. Brief PEA arrest. Shock. CCM called to admit   Interim History / Subjective:  Fentanyl increased overnight, 100 mcg/h, norepinephrine in the mid teens, slightly improved it seems   Objective   Blood pressure 116/85, pulse (!) 58, temperature 99.7 F (37.6 C), resp. rate 16, weight 89.8 kg, SpO2 99 %.    Vent Mode: PRVC FiO2 (%):  [50 %-100 %] 50 % Set Rate:  [16  bmp-20 bmp] 16 bmp Vt Set:  [700 mL] 700 mL PEEP:  [5 cmH20-8 cmH20] 8 cmH20 Plateau Pressure:  [16 cmH20-24 cmH20] 18 cmH20   Intake/Output Summary (Last 24 hours) at 08/31/2022 1045 Last data filed at 08/31/2022 0600 Gross per 24 hour  Intake 3627.81 ml  Output 700 ml  Net 2927.81 ml   Filed Weights   08/30/22 1200 08/31/22 0232  Weight: 91.6 kg 89.8 kg    Examination: General: Critically ill older adult M intubated lightly sedated  HENT: NCAT. ETT secure pink mm  Lungs: Diminished mechanical breath sounds. Mechanically ventilated  Cardiovascular: s1s2 cap refill < 3sec  Abdomen: soft ndnt hypoactive  Extremities: no acute joint deformity  Neuro: On fentanyl, minimally responsive, reportedly not on person units of upper extremities overnight GU: wnl  Resolved Hospital Problem list     Assessment & Plan:   Cardiac arrest  -PEA. About 5 min ACLS. Looks like he aspirated -- ?if catalyst was plugging. Also appears pretty hypovolemic -supportive care  Shock: Likely combination of hypovolemia as well as septic shock after aspiration event and aspiration pneumonia as demonstrated by abnormal chest x-ray, new infiltrates consistent with pneumonia. -NE for MAP > 65, will add vasopressin if not able to wean after ongoing resuscitation --Additional 1 L bolus and albumin ordered this morning -TTE ordered  Status epilepticus Possible L MCA CVA  -not a thrombectomy candidate with no LVO -on 500 keppra BID at home, increased to 1g Keppra q12hr -cEEG without seizures, even on button  triggered event no seizures -Midazolam as needed -seizure precautions -If cardiac device is MRI compatible and aggressive care is continued, MRI brain  Acute respiratory failure with hypoxia due to aspiration PNA -PRVC, VAP bundle, stress ulcer prophylaxis -Unasyn for aspiration pneumonia  HFrEF Recurrent VT  Afib on eliquis  CAD  Poorly controlled HTN -on amio, mexitil, eliquis, plavix, metop,  lasix, entresto at home -optimize lytes -cont tele  -ECHO, BNP pending  -hold antihypertensives with shock  -Continue amio, mexitil -hold eliquis,  plavix  Leukocytosis : Suspect related to PEA arrest as well as aspiration pneumonia. -Unasyn as above -Bcx, UA, tracheal aspirate  -consider CT a/p if no source   Metastatic colon cancer  -functional status and comorbidities (poorly controlled HTN, CHF) limit some chemo options -on Xeloda outpt   Goals of Care -decision makers are son and daughter.  On admission conveyed recommendation for DNR status, and recommended coming to hospital to discuss further.  Best Practice (right click and "Reselect all SmartList Selections" daily)   Diet/type: NPO DVT prophylaxis: prophylactic heparin  GI prophylaxis: PPI Lines: N/A Foley:  N/A Code Status:  full code Last date of multidisciplinary goals of care discussion [pending]  Labs   CBC: Recent Labs  Lab 08/29/22 1450 08/30/22 1231 08/30/22 1402 08/30/22 1752 08/30/22 2022 08/31/22 0419  WBC 14.1* 23.3*  --   --   --  16.9*  NEUTROABS 11.8* 19.7*  --   --   --   --   HGB 16.7 18.0*  17.0 15.6 16.7 16.0 14.8  HCT 48.8 53.0*  48.5 46.0 49.0 47.0 43.0  MCV 91.4 91.0  --   --   --  89.6  PLT 146* 156  --   --   --  153     Basic Metabolic Panel: Recent Labs  Lab 08/29/22 1450 08/30/22 1231 08/30/22 1402 08/30/22 1427 08/30/22 1752 08/30/22 1919 08/30/22 2022 08/31/22 0419  NA 140 135   < > 137 135 135 135 132*  K 3.7 4.4   < > 4.0 3.2* 3.0* 2.9* 3.8  CL 102 97*  --  96*  --  101  --  99  CO2 27  --   --  26  --  20*  --  21*  GLUCOSE 112* 144*  --  141*  --  193*  --  144*  BUN 16 21  --  15  --  18  --  20  CREATININE 1.19 1.00  --  1.20  --  1.28*  --  1.15  CALCIUM 9.1  --   --  9.3  --  8.0*  --  8.0*  MG  --   --   --  1.7  --   --   --   --    < > = values in this interval not displayed.    GFR: Estimated Creatinine Clearance: 83.5 mL/min (by C-G formula  based on SCr of 1.15 mg/dL). Recent Labs  Lab 08/29/22 1450 08/30/22 1231 08/30/22 1438 08/30/22 1845 08/31/22 0419  WBC 14.1* 23.3*  --   --  16.9*  LATICACIDVEN  --   --  6.3* 5.0*  --      Liver Function Tests: Recent Labs  Lab 08/29/22 1450 08/30/22 1427 08/31/22 0419  AST 19 43* 367*  ALT 25 32 296*  ALKPHOS 89 84 75  BILITOT 0.3 0.9 0.6  PROT 7.5 7.6 6.0*  ALBUMIN 4.0 3.9 2.8*    No  results for input(s): "LIPASE", "AMYLASE" in the last 168 hours. No results for input(s): "AMMONIA" in the last 168 hours.  ABG    Component Value Date/Time   PHART 7.475 (H) 08/30/2022 2022   PCO2ART 28.7 (L) 08/30/2022 2022   PO2ART 117 (H) 08/30/2022 2022   HCO3 21.0 08/30/2022 2022   TCO2 22 08/30/2022 2022   ACIDBASEDEF 1.0 08/30/2022 2022   O2SAT 99 08/30/2022 2022     Coagulation Profile: Recent Labs  Lab 08/30/22 1231  INR 1.6*     Cardiac Enzymes: No results for input(s): "CKTOTAL", "CKMB", "CKMBINDEX", "TROPONINI" in the last 168 hours.  HbA1C: Hgb A1c MFr Bld  Date/Time Value Ref Range Status  05/08/2022 04:37 PM 5.1 4.8 - 5.6 % Final    Comment:    (NOTE) Pre diabetes:          5.7%-6.4%  Diabetes:              >6.4%  Glycemic control for   <7.0% adults with diabetes   09/21/2020 11:30 AM 6.9 (H) 4.8 - 5.6 % Final    Comment:    (NOTE) Pre diabetes:          5.7%-6.4%  Diabetes:              >6.4%  Glycemic control for   <7.0% adults with diabetes     CBG: Recent Labs  Lab 08/30/22 1225  GLUCAP 158*     Review of Systems:   Unable to obtain, intubated sedated  Past Medical History:  He,  has a past medical history of AICD (automatic cardioverter/defibrillator) present (2005), CAD (coronary artery disease) (12/01/2013), Chronic combined systolic and diastolic CHF, NYHA class 1 ([place]) (12/01/2013), Erectile dysfunction (12/01/2013), HTN (hypertension) (12/01/2013), Hyperlipidemia (12/01/2013), Ischemic cardiomyopathy (12/01/2013),  Presence of permanent cardiac pacemaker, and Sleep apnea.   Surgical History:   Past Surgical History:  Procedure Laterality Date   ACHILLES TENDON REPAIR     lft foot   BIOPSY  04/16/2022   Procedure: BIOPSY;  Surgeon: Yetta Flock, MD;  Location: [place] [facility];  Service: Gastroenterology;;   CARDIAC CATHETERIZATION N/A 05/05/2016   Procedure: Left Heart Cath and Coronary Angiography;  Surgeon: Leonie Man, MD;  Location: [place] [facility];  Service: Cardiovascular;  Laterality: N/A;   CARDIAC DEFIBRILLATOR PLACEMENT     CARDIOVERSION N/A 09/13/2020   Procedure: CARDIOVERSION;  Surgeon: Werner Lean, MD;  Location: [place] [facility];  Service: Cardiovascular;  Laterality: N/A;   CARDIOVERSION N/A 09/19/2020   Procedure: CARDIOVERSION;  Surgeon: [name], MD;  Location[facility];  Service: Cardiovascular;  Laterality: N/A;   COLON RESECTION N/A 04/18/2022   Procedure: HAND ASSISTED LAPAROSCOPIC RIGHT COLON RESECTION;  Surgeon: Felicie Morn, MD;  Location: [place];  Service: General;  Laterality: N/A;   COLONOSCOPY WITH PROPOFOL N/A 04/16/2022   Procedure: COLONOSCOPY WITH PROPOFOL;  Surgeon: Yetta Flock, MD;  Location: [place];  Service: Gastroenterology;  Laterality: N/A;   EP IMPLANTABLE DEVICE N/A 12/19/2016   Procedure: ICD Generator Changeout;  Surgeon: [name], MD;  Location: [place] [facility];  Service: Cardiovascular;  Laterality: N/A;   ESOPHAGOGASTRODUODENOSCOPY N/A 04/16/2022   Procedure: ESOPHAGOGASTRODUODENOSCOPY (EGD);  Surgeon: Yetta Flock, MD;  Location[facility] ENDOSCOPY;  Service: Gastroenterology;  Laterality: N/A;   IR ANGIOGRAM SELECTIVE EACH ADDITIONAL VESSEL  05/09/2022   IR ANGIOGRAM VISCERAL SELECTIVE  05/09/2022   IR CATHETER TUBE CHANGE  05/14/2022   IR CATHETER TUBE CHANGE  06/27/2022   IR EMBO ART  VEN HEMORR LYMPH EXTRAV  INC GUIDE ROADMAPPING  05/09/2022   IR IMAGE GUIDED DRAINAGE PERCUT CATH  PERITONEAL  RETROPERIT  05/09/2022   IR PATIENT EVAL TECH 0-60 MINS  06/11/2022   IR RADIOLOGIST EVAL & MGMT  06/26/2022   IR RADIOLOGIST EVAL & MGMT  07/16/2022   IR US GUIDE VASC ACCESS RIGHT  05/09/2022   LEFT HEART CATH AND CORONARY ANGIOGRAPHY N/A 04/17/2022   Procedure: LEFT HEART CATH AND CORONARY ANGIOGRAPHY;  Surgeon: Belva Crome, MD;  Location: [place] [facility];  Service: Cardiovascular;  Laterality: N/A;   POLYPECTOMY  04/16/2022   Procedure: POLYPECTOMY;  Surgeon: Yetta Flock, MD;  Location: [facility] ENDOSCOPY;  Service: Gastroenterology;;   RIGHT/LEFT HEART CATH AND CORONARY ANGIOGRAPHY N/A 09/24/2020   Procedure: RIGHT/LEFT HEART CATH AND CORONARY ANGIOGRAPHY;  Surgeon: Lorretta Harp, MD;  Location: [name] [facility];  Service: Cardiovascular;  Laterality: N/A;   SUBMUCOSAL TATTOO INJECTION  04/16/2022   Procedure: SUBMUCOSAL TATTOO INJECTION;  Surgeon: Yetta Flock, MD;  Location: [facility] ENDOSCOPY;  Service: Gastroenterology;;   WRIST TENODESIS       Social History:   reports that he quit smoking about 9 years ago. His smoking use included cigarettes. He has a 30.00 pack-year smoking history. He has never used smokeless tobacco. He reports that he does not currently use alcohol. He reports that he does not use drugs.   Family History:  His family history includes Brain cancer in his father; Heart disease in his mother; Hypertension in his daughter.   Allergies No Known Allergies   Home Medications  Prior to Admission medications   Medication Sig Start Date End Date Taking? Authorizing Provider  acetaminophen (TYLENOL) 325 MG tablet Take 1-2 tablets (325-650 mg total) by mouth every 4 (four) hours as needed for mild pain. 06/06/22   Setzer, Edman Circle, PA-C  ALPRAZolam Duanne Moron) 0.25 MG tablet Take 1 tablet (0.25 mg total) by mouth at bedtime as needed for anxiety or sleep. 08/29/22   [name], MD  amiodarone (PACERONE) 400 MG tablet Take 1 tablet (400 mg total) by mouth 2 (two) times  daily. 06/06/22   Setzer, Edman Circle, PA-C  atorvastatin (LIPITOR) 80 MG tablet Take 1 tablet (80 mg total) by mouth every evening. 06/06/22   Setzer, Edman Circle, PA-C  capecitabine (XELODA) 500 MG tablet Take 4 tablets every 12 hours for 14 days on and 7 days off, take after a meal. 08/21/22   [name], MD  clopidogrel (PLAVIX) 75 MG tablet Take 1 tablet (75 mg total) by mouth daily. 08/27/22   Croitoru, [name], MD  dapagliflozin propanediol (FARXIGA) 10 MG TABS tablet Take 1 tablet (10 mg total) by mouth daily. 06/06/22   Setzer, Edman Circle, PA-C  diclofenac Sodium (VOLTAREN) 1 % GEL Apply 2 g topically 4 (four) times daily. 06/06/22   Setzer, Edman Circle, PA-C  docusate sodium (COLACE) 100 MG capsule Take 1 capsule (100 mg total) by mouth 2 (two) times daily. 06/06/22   Setzer, Katharine Look J, PA-C  ELIQUIS 5 MG TABS tablet TAKE 1 TABLET(5 MG) BY MOUTH TWICE DAILY 07/28/22   Croitoru, [name], MD  ezetimibe (ZETIA) 10 MG tablet Take 1 tablet (10 mg total) by mouth daily. 06/06/22   Setzer, Edman Circle, PA-C  feeding supplement (ENSURE ENLIVE / ENSURE PLUS) LIQD Take 237 mLs by mouth 2 (two) times daily between meals. 06/06/22   Setzer, Edman Circle, PA-C  furosemide (LASIX)  40 MG tablet Take 1 tablet (40 mg total) by mouth daily. 06/06/22   Setzer, Edman Circle, PA-C  levETIRAcetam (KEPPRA) 500 MG tablet Take 1 tablet twice a day 06/20/22   [name], MD  methocarbamol (ROBAXIN) 500 MG tablet Take 1 tablet (500 mg total) by mouth every 6 (six) hours as needed for muscle spasms. 06/06/22   Setzer, Edman Circle, PA-C  metoprolol succinate (TOPROL-XL) 50 MG 24 hr tablet Take 1 tablet (50 mg total) by mouth 2 (two) times daily. Take with or immediately following a meal. 07/22/22   Croitoru, [name], MD  mexiletine (MEXITIL) 150 MG capsule Take 1 capsule (150 mg total) by mouth every 8 (eight) hours. 06/06/22   Setzer, Edman Circle, PA-C  nystatin (MYCOSTATIN/NYSTOP) powder Apply topically 2 (two) times daily. Apply To groin area 06/06/22   Setzer,  Edman Circle, PA-C  oxyCODONE-acetaminophen (PERCOCET/ROXICET) 5-325 MG tablet Take 1 tablet by mouth every 6 (six) hours as needed for moderate pain or severe pain. 06/06/22   Setzer, Edman Circle, PA-C  polyethylene glycol (MIRALAX / GLYCOLAX) 17 g packet Take 17 g by mouth 2 (two) times daily. 06/06/22   Setzer, Edman Circle, PA-C  potassium chloride (KLOR-CON M) 10 MEQ tablet Take 1 tablet (10 mEq total) by mouth daily. 06/06/22   Setzer, Edman Circle, PA-C  sacubitril-valsartan (ENTRESTO) 97-103 MG Take 1 tablet by mouth 2 (two) times daily. 08/06/22   Croitoru, [name], MD  sildenafil (VIAGRA) 50 MG tablet Take 1 tablet (50 mg total) by mouth as needed for erectile dysfunction. 01/25/21   Shirley Friar, PA-C  traZODone (DESYREL) 100 MG tablet Take 1 tablet (100 mg total) by mouth at bedtime. 06/20/22   [name], MD     Critical care time:    CRITICAL CARE Performed by: Lanier Clam   Total critical care time: 35 minutes  Critical care time was exclusive of separately billable procedures and treating other patients.  Critical care was necessary to treat or prevent imminent or life-threatening deterioration.  Critical care was time spent personally by me on the following activities: development of treatment plan with patient and/or surrogate as well as nursing, discussions with consultants, evaluation of patient's response to treatment, examination of patient, obtaining history from patient or surrogate, ordering and performing treatments and interventions, ordering and review of laboratory studies, ordering and review of radiographic studies, pulse oximetry and re-evaluation of patient's condition.   Lanier Clam, MD  [place] for contact info 08/31/2022, 10:45 AM

## 2022-08-31 NOTE — Progress Notes (Signed)
OT Cancellation Note  Patient Details Name: Gregory [name] MRN: [id number] DOB: Apr 27, 1961   Cancelled Treatment:    Reason Eval/Treat Not Completed: Medical issues which prohibited therapy;Patient not medically ready Pt remains intubated, sedated and with EEG. Will hold therapy attempts today and follow up tomorrow for OT eval.  Layla Maw 08/31/2022, 12:45 PM

## 2022-08-31 NOTE — Progress Notes (Signed)
ANTICOAGULATION CONSULT NOTE - Follow Up Consult  Pharmacy Consult for heparin Indication: chest pain/ACS  No Known Allergies  Patient Measurements: Weight: 89.8 kg (197 lb 15.6 oz) Heparin Dosing Weight: 89kg  Vital Signs: Temp: 99.7 F (37.6 C) (09/17 0900) Temp Source: Bladder (09/17 0724) BP: 116/85 (09/17 0900) Pulse Rate: 58 (09/17 1014)  Labs: Recent Labs    08/29/22 1450 08/30/22 1231 08/30/22 1402 08/30/22 1427 08/30/22 1752 08/30/22 1845 08/30/22 1919 08/30/22 2022 08/31/22 0049 08/31/22 0419 08/31/22 1034  HGB 16.7 18.0*  17.0   < >  --  16.7  --   --  16.0  --  14.8  --   HCT 48.8 53.0*  48.5   < >  --  49.0  --   --  47.0  --  43.0  --   PLT 146* 156  --   --   --   --   --   --   --  153  --   APTT  --  33  --   --   --   --   --   --  101*  --  103*  LABPROT  --  19.2*  --   --   --   --   --   --   --   --   --   INR  --  1.6*  --   --   --   --   --   --   --   --   --   HEPARINUNFRC  --   --   --   --   --   --   --   --  >1.10*  --   --   CREATININE 1.19 1.00  --  1.20  --   --  1.28*  --   --  1.15  --   TROPONINIHS  --   --   --  46*  --  90*  --   --   --   --   --    < > = values in this interval not displayed.     Estimated Creatinine Clearance: 83.5 mL/min (by C-G formula based on SCr of 1.15 mg/dL).   Medical History: Past Medical History:  Diagnosis Date   AICD (automatic cardioverter/defibrillator) present 2005   CAD (coronary artery disease) 12/01/2013   Chronic combined systolic and diastolic CHF, NYHA class 1 ([place]) 12/01/2013   Erectile dysfunction 12/01/2013   HTN (hypertension) 12/01/2013   Hyperlipidemia 12/01/2013   Ischemic cardiomyopathy 12/01/2013   Presence of permanent cardiac pacemaker    Sleep apnea     Assessment: 30 YOM presenting as code stroke, PEA arrest in CT, negative LVO on CTA.  Hx CAD, afib on Eliquis PTA  Heparin level high due to recent eliquis use.  APTT is 103 > goal on heparin drip 1150 uts/hr   Drawn from aline with heparin running into PIV No bleeding noted cbc stable  S/p possible cva will keep on lower end of range    Goal of Therapy:  Heparin level 0.3-0.5 Aptt 66-90  sec Monitor platelets by anticoagulation protocol: Yes   Plan:  Decrease Heparin gtt to 900  units/hr, no bolus Daily aptt heparin level and cbc   Bonnita Nasuti Pharm.D. CPP, BCPS Clinical Pharmacist [phone] 08/31/2022 11:55 AM   Please see AMION for all Pharmacists' Contact Phone Numbers 08/31/2022, 11:51 AM

## 2022-08-31 NOTE — Progress Notes (Signed)
ANTICOAGULATION CONSULT NOTE - Initial Consult  Pharmacy Consult for heparin Indication: chest pain/ACS  No Known Allergies  Patient Measurements: Weight: 89.8 kg (197 lb 15.6 oz) Heparin Dosing Weight: 89kg  Vital Signs: Temp: 100 F (37.8 C) (09/17 0215) Temp Source: Bladder (09/17 0115) BP: 128/94 (09/17 0215) Pulse Rate: 54 (09/17 0215)  Labs: Recent Labs    08/29/22 1450 08/30/22 1231 08/30/22 1402 08/30/22 1427 08/30/22 1752 08/30/22 1845 08/30/22 1919 08/30/22 2022 08/31/22 0049  HGB 16.7 18.0*  17.0 15.6  --  16.7  --   --  16.0  --   HCT 48.8 53.0*  48.5 46.0  --  49.0  --   --  47.0  --   PLT 146* 156  --   --   --   --   --   --   --   APTT  --  33  --   --   --   --   --   --  101*  LABPROT  --  19.2*  --   --   --   --   --   --   --   INR  --  1.6*  --   --   --   --   --   --   --   HEPARINUNFRC  --   --   --   --   --   --   --   --  >1.10*  CREATININE 1.19 1.00  --  1.20  --   --  1.28*  --   --   TROPONINIHS  --   --   --  46*  --  90*  --   --   --      Estimated Creatinine Clearance: 75 mL/min (A) (by C-G formula based on SCr of 1.28 mg/dL (H)).   Medical History: Past Medical History:  Diagnosis Date   AICD (automatic cardioverter/defibrillator) present 2005   CAD (coronary artery disease) 12/01/2013   Chronic combined systolic and diastolic CHF, NYHA class 1 ([place]) 12/01/2013   Erectile dysfunction 12/01/2013   HTN (hypertension) 12/01/2013   Hyperlipidemia 12/01/2013   Ischemic cardiomyopathy 12/01/2013   Presence of permanent cardiac pacemaker    Sleep apnea     Assessment: 70 YOM presenting as code stroke, PEA arrest in CT, negative LVO on CTA.  Hx CAD, afib on Eliquis PTA  Heparin level high due to recent eliquis use.  APTT is 101 secs which is therapeutic.  Goal of Therapy:  Heparin level 0.3-0.7 units/ml aPTT 66-102 seconds Monitor platelets by anticoagulation protocol: Yes   Plan:  Decrease Heparin gtt to 1150  units/hr, no bolus F/u 6 hour aPTT  Alanda Slim, PharmD, [facility] Clinical Pharmacist Please see AMION for all Pharmacists' Contact Phone Numbers 08/31/2022, 3:12 AM

## 2022-08-31 NOTE — Progress Notes (Addendum)
Neurology Progress Note  Brief HPI: Patient with a history of atrial fibrillation on Eliquis, colon cancer stage IV with pancreatic and adrenal gland lesions and liver mets, HTN, GI bleed, seizures well-controlled on keppra '500mg'$  bid, CAD and CHF with EF 20% s/p AICD who presented with sudden onset aphasia.  While en rout to the hospital, he had a seizure and was treated with Versed.  He required intubation in the ED and became pulseless while in CT requiring one round of CPR with epinephrine.  He had frequent runs of v-tach at that time.  He is currently intubated, sedated and requiring pressors to maintain MAP goal.  Initial head CT demonstrated questionable dense left MCA, but CTA revealed no LVO.  Subjective: Patent's norepi has been weaned down overnight.  He is no longer having frequent short runs of v-tach.  He remains intubated on minimal sedation with fentanyl. He tracks examiner and moves all 4 extremities spontaneously but does not follow commands. LTM EEG shows no seizure activity.  Exam: Vitals:   08/31/22 0715 08/31/22 0724  BP: 101/77   Pulse: (!) 54 (!) 54  Resp: 16 16  Temp: 99.9 F (37.7 C) 99.9 F (37.7 C)  SpO2: 99% 100%   Gen: In bed, intubated and sedated with fentanyl Resp: respirations synchronous with ventilator  Neurological:  pupils pinpoint and reactive, EOMI, (+) corneals, cough, gag. Hearing intact to voice. Moves all 4 extremities spontaneously but not to command. SILT. Reflexes 1+ symm throughout, toes w/d only bilat  Pertinent Labs: ABG    Component Value Date/Time   PHART 7.475 (H) 08/30/2022 2022   PCO2ART 28.7 (L) 08/30/2022 2022   PO2ART 117 (H) 08/30/2022 2022   HCO3 21.0 08/30/2022 2022   TCO2 22 08/30/2022 2022   ACIDBASEDEF 1.0 08/30/2022 2022   O2SAT 99 08/30/2022 2022       Latest Ref Rng & Units 08/31/2022    4:19 AM 08/30/2022    8:22 PM 08/30/2022    5:52 PM  CBC  WBC 4.0 - 10.5 K/uL 16.9     Hemoglobin 13.0 - 17.0 g/dL 14.8  16.0   16.7   Hematocrit 39.0 - 52.0 % 43.0  47.0  49.0   Platelets 150 - 400 K/uL 153          Latest Ref Rng & Units 08/31/2022    4:19 AM 08/30/2022    8:22 PM 08/30/2022    7:19 PM  BMP  Glucose 70 - 99 mg/dL 144   193   BUN 8 - 23 mg/dL 20   18   Creatinine 0.61 - 1.24 mg/dL 1.15   1.28   Sodium 135 - 145 mmol/L 132  135  135   Potassium 3.5 - 5.1 mmol/L 3.8  2.9  3.0   Chloride 98 - 111 mmol/L 99   101   CO2 22 - 32 mmol/L 21   20   Calcium 8.9 - 10.3 mg/dL 8.0   8.0    Lipid Panel     Component Value Date/Time   CHOL 137 08/31/2022 0419   CHOL 137 12/30/2019 1242   TRIG 51 08/31/2022 0419   HDL 54 08/31/2022 0419   HDL 37 (L) 12/30/2019 1242   CHOLHDL 2.5 08/31/2022 0419   VLDL 10 08/31/2022 0419   LDLCALC 73 08/31/2022 0419   LDLCALC 85 12/30/2019 1242   LABVLDL 15 12/30/2019 1242   A1c pending  Imaging Reviewed:  CT-scan of the brain: Questionable hyperdense left MCA  CTA head and neck: negative for LVO, 70% stenosis of right ICA, severe calcific stenosis of right vertebral artery   MRI examination of the brain pending, will need to obtain tomorrow as patient has AICD  LTM EEG 9/17 shows no seizure activity  Echocardiogram 9/16 EF 35-40%, LVH and hypokinesis of anteroseptal wall, anterior wall and apical segment  Assessment: 61 year old patient with history of stage IV colon cancer, CHF with 20% EF and ACID, a-fib on Eliquis, HTN and seizures presented in status epilepticus.  While in the ED, he required intubation, and he became pulseless requiring one round of CPR in CT.  Patient continues to require norepinephrine to maintain blood pressure.  He appears to have no seizure activity at this time but is still minimally responsive with only extensor posturing to sternal rub.  May have anoxic brain injury from cardiac arrest, however, downtime was brief (<5 minutes) and high quality CPR was provided.  Will need MRI with and without contrast to evaluate for stroke, anoxic  brain injury and brain mets from known colon cancer.  Given that patient initially presented with sudden onset aphasia, stroke is possible but will not know until MRI is done.  Impression: seizures, possible stroke and/or anoxic brain injury in patient with colon cancer and CHF  Recommendations: - Keppra '1500mg'$  q12h - Continue LTM EEG - MRI brain with and without contrast when stable for further imaging - Check A1c and LDL + add statin if LDL >70 - Heparin gtt for a fib - q4 hr neuro checks - STAT head CT for any change in neuro exam - Tele - PT/OT/SLP - Stroke education - Amb referral to neurology upon discharge   - management of cardiac issues per Juana Diaz , MSN, AGACNP-BC [facility] See Amion for schedule and pager information 08/31/2022 9:00 AM Neurology Attending Attestation   I examined the patient and discussed plan with Ms. Carron Curie NP. Above note has been edited by me to reflect my findings and recommendations.   Patient with known hx epilepsy presented in focal status epilepticus (unknown trigger). Patient has hx a fib on eliquis and is hypercoagulable 2/2 known stage IV colon cancer. While it is possible patient had an acute ischemic stroke precipitating his seizure event on admission I do not want to hold anticoagulation in this setting without evidence that this is the case, particularly in light of the fact that his mental status has improved since yesterday. Will order head CT today and plan for MRI brain wwo contrast 48 hrs from arrest (9/18) to r/o stroke, identify any triggers for breakthrough seizures (status), and to aid in prognostication. Continue LTM EEG, can likely be disconnected tomorrow if no seizures overnight. Leads are MRI compatible. Continue keppra '1500mg'$  bid.   This patient is critically ill and at significant risk of neurological worsening, death and care requires constant monitoring of vital signs,  hemodynamics,respiratory and cardiac monitoring, neurological assessment, discussion with family, other specialists and medical decision making of high complexity. I spent 65 minutes of neurocritical care time  in the care of  this patient. This was time spent independent of any time provided by nurse practitioner or PA.   Su Monks, MD [facility] [phone]   If 7pm- 7am, please page neurology on call as listed in [place].

## 2022-08-31 NOTE — Progress Notes (Signed)
PT Cancellation Note  Patient Details Name: Gregory [name] MRN: [id number] DOB: May 07, 1961   Cancelled Treatment:    Reason Eval/Treat Not Completed: Medical issues which prohibited therapy;Patient not medically ready.  Pt remains intubated, sedated and with EEG. Will hold therapy attempts today and follow up tomorrow for PT eval.  08/31/2022  Ginger Carne., PT Acute Rehabilitation Services [phone]  (office)  Tessie Fass Rilley Poulter 08/31/2022, 2:40 PM

## 2022-08-31 NOTE — Progress Notes (Signed)
PHARMACY - PHYSICIAN COMMUNICATION CRITICAL VALUE ALERT - BLOOD CULTURE IDENTIFICATION (BCID)  Gregory [name] is an 61 y.o. male who presented to [facility] on 08/30/2022 as a code stroke.   Assessment: Patient with possible shock and aspiration pneumonia, temp up to 100.8 and has stayed around 99.7 all day, wbc elevated at 16.9. Renal function normal. Patient was placed on empiric unasyn on arrival. Now with 1/2 blood cultures growing staph species, but no speciation or resistance detected.    Name of physician (or Provider) Contacted: Cruz MD  Current antibiotics: Unasyn  Changes to prescribed antibiotics recommended:  Patient is on recommended antibiotics - No changes needed Patient appears to be responding to current antibiotics. Will continue current course overnight with low threshold to escalate antibiotics.   Results for orders placed or performed during the hospital encounter of 08/30/22  Blood Culture ID Panel (Reflexed) (Collected: 08/30/2022  6:41 PM)  Result Value Ref Range   Enterococcus faecalis NOT DETECTED NOT DETECTED   Enterococcus Faecium NOT DETECTED NOT DETECTED   Listeria monocytogenes NOT DETECTED NOT DETECTED   Staphylococcus species DETECTED (A) NOT DETECTED   Staphylococcus aureus (BCID) NOT DETECTED NOT DETECTED   Staphylococcus epidermidis NOT DETECTED NOT DETECTED   Staphylococcus lugdunensis NOT DETECTED NOT DETECTED   Streptococcus species NOT DETECTED NOT DETECTED   Streptococcus agalactiae NOT DETECTED NOT DETECTED   Streptococcus pneumoniae NOT DETECTED NOT DETECTED   Streptococcus pyogenes NOT DETECTED NOT DETECTED   A.calcoaceticus-baumannii NOT DETECTED NOT DETECTED   Bacteroides fragilis NOT DETECTED NOT DETECTED   Enterobacterales NOT DETECTED NOT DETECTED   Enterobacter cloacae complex NOT DETECTED NOT DETECTED   Escherichia coli NOT DETECTED NOT DETECTED   Klebsiella aerogenes NOT DETECTED NOT DETECTED   Klebsiella oxytoca NOT  DETECTED NOT DETECTED   Klebsiella pneumoniae NOT DETECTED NOT DETECTED   Proteus species NOT DETECTED NOT DETECTED   Salmonella species NOT DETECTED NOT DETECTED   Serratia marcescens NOT DETECTED NOT DETECTED   Haemophilus influenzae NOT DETECTED NOT DETECTED   Neisseria meningitidis NOT DETECTED NOT DETECTED   Pseudomonas aeruginosa NOT DETECTED NOT DETECTED   Stenotrophomonas maltophilia NOT DETECTED NOT DETECTED   Candida albicans NOT DETECTED NOT DETECTED   Candida auris NOT DETECTED NOT DETECTED   Candida glabrata NOT DETECTED NOT DETECTED   Candida krusei NOT DETECTED NOT DETECTED   Candida parapsilosis NOT DETECTED NOT DETECTED   Candida tropicalis NOT DETECTED NOT DETECTED   Cryptococcus neoformans/gattii NOT DETECTED NOT DETECTED   Erin Hearing PharmD., BCPS Clinical Pharmacist 08/31/2022 10:40 PM

## 2022-09-01 ENCOUNTER — Encounter (HOSPITAL_COMMUNITY): Payer: Self-pay | Admitting: *Deleted

## 2022-09-01 ENCOUNTER — Inpatient Hospital Stay (HOSPITAL_COMMUNITY): Payer: Commercial Managed Care - HMO

## 2022-09-01 DIAGNOSIS — E43 Unspecified severe protein-calorie malnutrition: Secondary | ICD-10-CM | POA: Insufficient documentation

## 2022-09-01 DIAGNOSIS — R4182 Altered mental status, unspecified: Secondary | ICD-10-CM | POA: Diagnosis not present

## 2022-09-01 DIAGNOSIS — I469 Cardiac arrest, cause unspecified: Secondary | ICD-10-CM | POA: Diagnosis not present

## 2022-09-01 DIAGNOSIS — R569 Unspecified convulsions: Secondary | ICD-10-CM | POA: Diagnosis not present

## 2022-09-01 LAB — BASIC METABOLIC PANEL
Anion gap: 10 (ref 5–15)
Anion gap: 12 (ref 5–15)
Anion gap: 8 (ref 5–15)
BUN: 17 mg/dL (ref 8–23)
BUN: 17 mg/dL (ref 8–23)
BUN: 19 mg/dL (ref 8–23)
CO2: 23 mmol/L (ref 22–32)
CO2: 24 mmol/L (ref 22–32)
CO2: 22 mmol/L (ref 22–32)
Calcium: 8.1 mg/dL — ABNORMAL LOW (ref 8.9–10.3)
Calcium: 8.1 mg/dL — ABNORMAL LOW (ref 8.9–10.3)
Calcium: 7.7 mg/dL — ABNORMAL LOW (ref 8.9–10.3)
Chloride: 105 mmol/L (ref 98–111)
Chloride: 102 mmol/L (ref 98–111)
Chloride: 105 mmol/L (ref 98–111)
Creatinine, Ser: 0.9 mg/dL (ref 0.61–1.24)
Creatinine, Ser: 1 mg/dL (ref 0.61–1.24)
Creatinine, Ser: 0.9 mg/dL (ref 0.61–1.24)
GFR, Estimated: >60 mL/min (ref >60)
GFR, Estimated: >60 mL/min (ref >60)
GFR, Estimated: >60 mL/min (ref >60)
Glucose, Bld: 129 mg/dL — ABNORMAL HIGH (ref 70–99)
Glucose, Bld: 124 mg/dL — ABNORMAL HIGH (ref 70–99)
Glucose, Bld: 143 mg/dL — ABNORMAL HIGH (ref 70–99)
Potassium: 2.9 mmol/L — ABNORMAL LOW (ref 3.5–5.1)
Potassium: 3.1 mmol/L — ABNORMAL LOW (ref 3.5–5.1)
Potassium: 3.8 mmol/L (ref 3.5–5.1)
Sodium: 138 mmol/L (ref 135–145)
Sodium: 138 mmol/L (ref 135–145)
Sodium: 135 mmol/L (ref 135–145)

## 2022-09-01 LAB — CBC
HCT: 33.7 % — ABNORMAL LOW (ref 39.0–52.0)
Hemoglobin: 11.9 g/dL — ABNORMAL LOW (ref 13.0–17.0)
MCH: 31.6 pg (ref 26.0–34.0)
MCHC: 35.3 g/dL (ref 30.0–36.0)
MCV: 89.6 fL (ref 80.0–100.0)
Platelets: 127 10*3/uL — ABNORMAL LOW (ref 150–400)
RBC: 3.76 MIL/uL — ABNORMAL LOW (ref 4.22–5.81)
RDW: 20.1 % — ABNORMAL HIGH (ref 11.5–15.5)
WBC: 11.7 10*3/uL — ABNORMAL HIGH (ref 4.0–10.5)
nRBC: 0 % (ref 0.0–0.2)

## 2022-09-01 LAB — GLUCOSE, CAPILLARY
Glucose-Capillary: 125 mg/dL — ABNORMAL HIGH (ref 70–99)
Glucose-Capillary: 135 mg/dL — ABNORMAL HIGH (ref 70–99)
Glucose-Capillary: 150 mg/dL — ABNORMAL HIGH (ref 70–99)

## 2022-09-01 LAB — PHOSPHORUS
Phosphorus: 2.2 mg/dL — ABNORMAL LOW (ref 2.5–4.6)
Phosphorus: 1.6 mg/dL — ABNORMAL LOW (ref 2.5–4.6)

## 2022-09-01 LAB — MAGNESIUM
Magnesium: 2 mg/dL (ref 1.7–2.4)
Magnesium: 2 mg/dL (ref 1.7–2.4)

## 2022-09-01 LAB — HEMOGLOBIN A1C
Hgb A1c MFr Bld: 6.3 % — ABNORMAL HIGH (ref 4.8–5.6)
Mean Plasma Glucose: 134.11 mg/dL

## 2022-09-01 LAB — APTT
aPTT: 54 seconds — ABNORMAL HIGH (ref 24–36)
aPTT: 53 seconds — ABNORMAL HIGH (ref 24–36)

## 2022-09-01 LAB — HEPARIN LEVEL (UNFRACTIONATED): Heparin Unfractionated: 1.06 IU/mL — ABNORMAL HIGH (ref 0.30–0.70)

## 2022-09-01 MED ORDER — ADULT MULTIVITAMIN W/MINERALS CH
1.0000 | ORAL_TABLET | Freq: Every day | ORAL | Status: DC
  Administered 2022-09-01 – 2022-09-02 (×2): 1
  Filled 2022-09-01 (×2): qty 1

## 2022-09-01 MED ORDER — PROSOURCE TF20 ENFIT COMPATIBL EN LIQD
60.0000 mL | Freq: Every day | ENTERAL | Status: DC
  Administered 2022-09-01 – 2022-09-02 (×2): 60 mL
  Filled 2022-09-01 (×2): qty 60

## 2022-09-01 MED ORDER — POTASSIUM CHLORIDE 10 MEQ/50ML IV SOLN
10.0000 meq | INTRAVENOUS | Status: AC
  Administered 2022-09-01 (×8): 10 meq via INTRAVENOUS
  Filled 2022-09-01 (×8): qty 50

## 2022-09-01 MED ORDER — VITAL HIGH PROTEIN PO LIQD: 1000.0000 mL | ORAL | Status: DC

## 2022-09-01 MED ORDER — VITAL 1.5 CAL PO LIQD
1000.0000 mL | ORAL | Status: DC
  Administered 2022-09-01: 1000 mL

## 2022-09-01 MED ORDER — ATORVASTATIN CALCIUM 10 MG PO TABS
20.0000 mg | ORAL_TABLET | Freq: Every day | ORAL | Status: DC
  Administered 2022-09-01 – 2022-09-02 (×2): 20 mg
  Filled 2022-09-01 (×2): qty 2

## 2022-09-01 NOTE — Procedures (Addendum)
Epilepsy Attending: Lora Havens  Referring Physician/Provider: Katy Apo, NP Duration: 08/31/2022 1947 to 09/01/2022  1057   Patient history: 61yo M Patient with known hx epilepsy presented in focal status epilepticus. EEG to evaluate for seizure   Level of alertness: lethargic/sedated   AEDs during EEG study: LEV   Technical aspects: This EEG study was done with scalp electrodes positioned according to the 10-20 International system of electrode placement. Electrical activity was reviewed with band pass filter of 1-'70Hz'$ , sensitivity of 7 uV/mm, display speed of 49m/sec with a '60Hz'$  notched filter applied as appropriate. EEG data were recorded continuously and digitally stored.  Video monitoring was available and reviewed as appropriate.   Description: EEG showed continuous generalized 3 to 6 Hz theta-delta slowing admixed with intermittent generalized 13-'15Hz'$  beta activity. Hyperventilation and photic stimulation were not performed.      ABNORMALITY.  - Continuous slow, generalized   IMPRESSION: This study is suggestive of severe diffuse encephalopathy, nonspecific etiology but could be related to sedation. No seizures or epileptiform discharges were seen throughout the recording.    Coulson Wehner OBarbra Sarks

## 2022-09-01 NOTE — Progress Notes (Signed)
Initial Nutrition Assessment  DOCUMENTATION CODES:   Severe malnutrition in context of chronic illness  INTERVENTION:  Initiate tube feeding via OGT: Vital 1.5 at 70 ml/h (1680 ml per day) Start at 72m/h and advance by 131mq8h to goal of 7078m Prosource TF20 60 ml 1x/d  Provides 2600 kcal, 133 gm protein, 1284 ml free water daily  Pt is at risk for refeeding syndrome, monitor lytes and replace per protocol  MVI with minerals daily   NUTRITION DIAGNOSIS:   Severe Malnutrition related to chronic illness (cancer) as evidenced by severe fat depletion, severe muscle depletion.  GOAL:   Patient will meet greater than or equal to 90% of their needs  MONITOR:   Vent status, TF tolerance, Weight trends, I & O's, Labs  REASON FOR ASSESSMENT:   Consult Enteral/tube feeding initiation and management  ASSESSMENT:   Pt with hx of metastatic colon cancer, CHF, CAD, DM type 2, chronic pancreatitis, HTN, HLD, and seizure disorder brought to ED after exhibiting acute strokelike symptoms. After arriving to ED, taken for emergent CT but found to be profoundly hypoxic and was emergently intubated. After intubation, had a PEA arrest with ROSC ~5 minutes.  Patient is currently intubated on ventilator support OG tube in place (fundus of the stomach). No family present to provide a nutrition hx. Requiring pressor support x1, MAP in goal range. Significant muscle and fat depletions present on exam.    TF protocol ordered for pt, will adjust formula to better meet increased needs. Discussed TF change with RN. MV: 11.3 L/min Temp (24hrs), Avg:99.3 F (37.4 C), Min:98.2 F (36.8 C), Max:100 F (37.8 C)   Intake/Output Summary (Last 24 hours) at 09/01/2022 1312 Last data filed at 09/01/2022 1124 Gross per 24 hour  Intake 3393.93 ml  Output 1250 ml  Net 2143.93 ml  Net IO Since Admission: 5,071.74 mL [09/01/22 1312]  Nutritionally Relevant Medications: Scheduled Meds:  atorvastatin  20  mg Oral Daily   feeding supplement (PROSource TF20)  60 mL Per Tube Daily   feeding supplement (VITAL HIGH PROTEIN)  1,000 mL Per Tube Q24H   pantoprazole  40 mg Per Tube Daily   Continuous Infusions:  lactated ringers 50 mL/hr at 09/01/22 0800   norepinephrine (LEVOPHED) Adult infusion 12 mcg/min (09/01/22 0800)   potassium chloride 10 mEq (09/01/22 0921)   PRN Meds: docusate, ondansetron, polyethylene glycol  Labs Reviewed: K 3.1 Phosphorus 2.2 CBG ranges from 124-158 mg/dL over the last 24 hours HgbA1c 6.3%  NUTRITION - FOCUSED PHYSICAL EXAM:  Flowsheet Row Most Recent Value  Orbital Region Severe depletion  Upper Arm Region Severe depletion  Thoracic and Lumbar Region Moderate depletion  Buccal Region Severe depletion  Temple Region Moderate depletion  Clavicle Bone Region Mild depletion  Clavicle and Acromion Bone Region Mild depletion  Scapular Bone Region Unable to assess  Dorsal Hand Unable to assess  [mittens]  Patellar Region Severe depletion  Anterior Thigh Region Severe depletion  Posterior Calf Region Severe depletion  Edema (RD Assessment) None  Hair Reviewed  Eyes Reviewed  Mouth Unable to assess  Skin Reviewed  Nails Unable to assess  [mittens]   Diet Order:   Diet Order             Diet NPO time specified  Diet effective now                   EDUCATION NEEDS:  Not appropriate for education at this time  Skin:  Skin Assessment:  Skin Integrity Issues: Skin Integrity Issues:: Stage I Stage I: sacrum  Last BM:  prior to admission  Height:  Ht Readings from Last 1 Encounters:  08/29/22 '6\' 6"'$  (1.981 m)    Weight:  Wt Readings from Last 1 Encounters:  09/01/22 93.3 kg    Ideal Body Weight:  97.3 kg  BMI:  Body mass index is 23.77 kg/m.  Estimated Nutritional Needs:  Kcal:  2500-2700 kcal/d Protein:  125-140g/d Fluid:  2.5L/d    Ranell Patrick, RD, LDN Clinical Dietitian RD pager # available in [place]  After hours/weekend  pager # available in [facility]

## 2022-09-01 NOTE — Progress Notes (Signed)
MB performed maintenance on electrodes. All impedances are below 10k ohms. No skin breakdown noted.  

## 2022-09-01 NOTE — Progress Notes (Signed)
NAME:  Gregory [name], MRN:  [id number], DOB:  1961/10/14, LOS: 2 ADMISSION DATE:  08/30/2022, CONSULTATION DATE:  08/30/22 REFERRING MD:  Kathrynn Humble, CHIEF COMPLAINT:  cardiac arrest   History of Present Illness:  61 yo PMH metastatic colon cancer, HFrEF, VT s/p ICD, Afib, HTN -poorly controlled, DM, CAD, seizure disorder, recent intraabdominal abscess s/p drain presented to ED 08/30/22 with AMS as code stroke. LKN 9/16 0830. There was also concern for possible seizure like activity en route to hospital and pt received 2.'5mg'$  versed for this. Witnessed seizure activity in ED, received 4400 keppra and Ativan.  CT H with L MCA hyperdensity.  Patient decompensated after CT, becoming more hypoxic and was not able to protect his airway. Intubated with etomidate + roc. Taken for CTA Head/neck, but had a PEA arrest requiring about 5 min ACLS.  CTA Head/neck without LVO. R ICA stenosis, R vertebral artery calcific stenosis  CTA chest without evidence of PE. Looks like he aspirated, has rib fx in setting of CPR, small pleural effusion   After ROSC pt has remained unstable, requiring NE. Having runs on VT in ED.   PCCM consulted for admission in this setting   Pertinent  Medical History  Metastatic colon cancer Seizure disorder HTN OSA DM2 Recurrent VT Afib CAD IAA  Chronic pancreatitis  Splenic artery pseudoaneurysm  Significant Hospital Events: Including procedures, antibiotic start and stop dates in addition to other pertinent events   9/16 to ED as Code stroke + concern for seizure. L MCA stroke. Hypoxic, req ETT. Brief PEA arrest. Shock. CCM called to admit   Interim History / Subjective:  Remains on fentanyl and low-dose norepinephrine.  Episodes of rhythmic eye blinking.   Objective   Blood pressure 136/76, pulse (!) 54, temperature 98.2 F (36.8 C), resp. rate 16, weight 93.3 kg, SpO2 100 %.    Vent Mode: PRVC FiO2 (%):  [40 %] 40 % Set Rate:  [16 bmp] 16 bmp Vt Set:   [700 mL] 700 mL PEEP:  [8 cmH20] 8 cmH20 Plateau Pressure:  [17 cmH20-18 cmH20] 17 cmH20   Intake/Output Summary (Last 24 hours) at 09/01/2022 1049 Last data filed at 09/01/2022 1026 Gross per 24 hour  Intake 2927.93 ml  Output 925 ml  Net 2002.93 ml    Filed Weights   08/30/22 1200 08/31/22 0232 09/01/22 0500  Weight: 91.6 kg 89.8 kg 93.3 kg    Examination: General: Critically ill older adult M intubated lightly sedated  HENT: NCAT. ETT secure pink mm  Lungs: Chest clear. Tolerating SBT Cardiovascular: s1s2 cap refill < 3sec  Abdomen: soft ndnt hypoactive  Extremities: no acute joint deformity  Neuro: On fentanyl, will orient to voice, trying to sit up in bed, intermittently following commands.  GU: Foley catheter in place.   Continuous EEG personally reviewed: frontal eye field activation on slow background   Ancillary test personally reviewed:  Hypokalemia 3.1 Mild leukocytosis.  CT head: nil acute  Assessment & Plan:   Critically ill due to PEA Cardiac arrest due to suspected aspiration event following seizure, with evidence of hypoxic ischemic encephalopathy.  - Showing signs of neurological recovery. Keep sedation minimal and observe for further neurological recovery.  - Formal neuroprognostication at 96h, but current progress suggests patient should may good neurological recovery.    Critically ill due to  combination of hypovolemia as well as septic shock after aspiration event and aspiration pneumonia as demonstrated by abnormal chest x-ray, new infiltrates consistent with pneumonia. -NE  for MAP > 65, will add vasopressin if not able to wean after ongoing resuscitation  Status epilepticus, cause unclear.  Possible L MCA CVA  -not a thrombectomy candidate with no LVO -on 500 keppra BID at home, increased to 1g Keppra q12hr -cEEG without seizures, even on button triggered event no seizures -Midazolam as needed -seizure precautions -If cardiac device is MRI  compatible and aggressive care is continued, MRI brain would be useful to rule out metastatic disease as cause of seizure.   Critically ill due to Acute respiratory failure with hypoxia due to aspiration PNA -PRVC, VAP bundle, stress ulcer prophylaxis -Unasyn for aspiration pneumonia  HFrEF Recurrent VT  Afib on eliquis  CAD  Poorly controlled HTN -on amio, mexitil, eliquis, plavix, metop, lasix, entresto at home -optimize lytes -cont tele  -ECHO, BNP pending  -hold antihypertensives with shock  -Continue amio, mexilitine -hold eliquis,  plavix  Metastatic colon cancer  -functional status and comorbidities (poorly controlled HTN, CHF) limit some chemo options -on Xeloda outpt   Goals of Care -decision makers are son and daughter.  On admission conveyed recommendation for DNR status, and recommended coming to hospital to discuss further.  Best Practice (right click and "Reselect all SmartList Selections" daily)   Diet/type: NPO start tube feeds DVT prophylaxis: IV heparin for atrial fibrillation.  GI prophylaxis: PPI Lines: N/A Foley:  N/A Code Status:  full code Last date of multidisciplinary goals of care discussion [daughter updated at the bedside. ]   CRITICAL CARE Performed by: Kipp Brood   Total critical care time: 40 minutes  Critical care time was exclusive of separately billable procedures and treating other patients.  Critical care was necessary to treat or prevent imminent or life-threatening deterioration.  Critical care was time spent personally by me on the following activities: development of treatment plan with patient and/or surrogate as well as nursing, discussions with consultants, evaluation of patient's response to treatment, examination of patient, obtaining history from patient or surrogate, ordering and performing treatments and interventions, ordering and review of laboratory studies, ordering and review of radiographic studies, pulse oximetry  and re-evaluation of patient's condition.   Kipp Brood, MD  [place] for contact info 09/01/2022, 10:49 AM

## 2022-09-01 NOTE — Progress Notes (Signed)
[facility] ADULT ICU REPLACEMENT PROTOCOL   The patient does apply for the [facility] Adult ICU Electrolyte Replacment Protocol based on the criteria listed below:   1.Exclusion criteria: TCTS patients, ECMO patients, and Dialysis patients 2. Is GFR >/= 30 ml/min? Yes.    Patient's GFYes.  R today is > 60 3. Is SCr </= 2?  Patient's SCr is 0.90 mg/dL 4. Did SCr increase >/= 0.5 in 24 hours? No. 5.Pt's weight >40kg  Yes.   6. Abnormal electrolyte(s): K+ 2.9  7. Electrolytes replaced per protocol 8.  Call MD STAT for K+ </= 2.5, Phos </= 1, or Mag </= 1 Physician:  Dr Barbaraann Barthel 09/01/2022 6:19 AM

## 2022-09-01 NOTE — Progress Notes (Signed)
vLTM discontinued   [facility] notified.  No skin breakdown at all skin sites.

## 2022-09-01 NOTE — Progress Notes (Signed)
ANTICOAGULATION CONSULT NOTE - Follow Up Consult  Pharmacy Consult for heparin Indication: chest pain/ACS  No Known Allergies  Patient Measurements: Weight: 93.3 kg (205 lb 11 oz) Heparin Dosing Weight: 89kg  Vital Signs: Temp: 98.8 F (37.1 C) (09/18 0500) BP: 113/77 (09/18 0500) Pulse Rate: 51 (09/18 0500)  Labs: Recent Labs    08/30/22 1231 08/30/22 1402 08/30/22 1427 08/30/22 1752 08/30/22 1845 08/30/22 1919 08/30/22 2022 08/31/22 0049 08/31/22 0419 08/31/22 1034 09/01/22 0501  HGB 18.0*  17.0   < >  --    < >  --   --  16.0  --  14.8  --  11.9*  HCT 53.0*  48.5   < >  --    < >  --   --  47.0  --  43.0  --  33.7*  PLT 156  --   --   --   --   --   --   --  153  --  127*  APTT 33  --   --   --   --   --   --  101*  --  103* 54*  LABPROT 19.2*  --   --   --   --   --   --   --   --   --   --   INR 1.6*  --   --   --   --   --   --   --   --   --   --   HEPARINUNFRC  --   --   --   --   --   --   --  >1.10*  --   --  1.06*  CREATININE 1.00  --  1.20  --   --  1.28*  --   --  1.15  --   --   TROPONINIHS  --   --  46*  --  90*  --   --   --   --   --   --    < > = values in this interval not displayed.     Estimated Creatinine Clearance: 85 mL/min (by C-G formula based on SCr of 1.15 mg/dL).   Medical History: Past Medical History:  Diagnosis Date   AICD (automatic cardioverter/defibrillator) present 2005   CAD (coronary artery disease) 12/01/2013   Chronic combined systolic and diastolic CHF, NYHA class 1 ([place]) 12/01/2013   Erectile dysfunction 12/01/2013   HTN (hypertension) 12/01/2013   Hyperlipidemia 12/01/2013   Ischemic cardiomyopathy 12/01/2013   Presence of permanent cardiac pacemaker    Sleep apnea     Assessment: 1 YOM presenting as code stroke, PEA arrest in CT, negative LVO on CTA.  Hx CAD, afib on Eliquis PTA  Heparin level high due to recent eliquis use.  APTT is 54 below goal on heparin drip 900 uts/hr  No bleeding noted cbc stable   S/p possible cva will keep on lower end of range    Goal of Therapy:  Heparin level 0.3-0.5 Aptt 66-90  sec Monitor platelets by anticoagulation protocol: Yes   Plan:  Increase Heparin gtt to 1000  units/hr, no bolus Heparin level in 6 hours Daily aptt heparin level and cbc  Alanda Slim, PharmD, [facility] Clinical Pharmacist Please see AMION for all Pharmacists' Contact Phone Numbers 09/01/2022, 5:50 AM

## 2022-09-01 NOTE — Progress Notes (Signed)
PT Cancellation Note  Patient Details Name: Rolfe Hartsell MRN: [id number] DOB: 06/18/1961   Cancelled Treatment:    Reason Eval/Treat Not Completed: Patient not medically ready (pt on continuous EEG and not yet appropriate)   Tashika Goodin B Maddelynn Moosman 09/01/2022, 8:04 AM [place] Office: [phone]

## 2022-09-01 NOTE — Progress Notes (Signed)
OT Cancellation Note  Patient Details Name: Gregory [name] MRN: [id number] DOB: 02-15-1961   Cancelled Treatment:    Reason Eval/Treat Not Completed: Patient not medically ready- pt on continuous EEG and not appropriate for OT at this time .  Jolaine Artist, OT Acute Rehabilitation Services Office [phone]   Delight Stare 09/01/2022, 8:27 AM

## 2022-09-01 NOTE — Progress Notes (Signed)
SLP Cancellation Note  Patient Details Name: Gregory [name] MRN: [id number] DOB: Oct 04, 1961   Cancelled treatment:       Reason Eval/Treat Not Completed: Patient not medically ready (remains on vent this am). Will f/u as able.     Osie Bond., M.A. [place] Office [phone]  Secure chat preferred  09/01/2022, 7:49 AM

## 2022-09-01 NOTE — Progress Notes (Addendum)
Neurology Progress Note  Brief HPI: Patient with a history of atrial fibrillation on Eliquis, colon cancer stage IV with pancreatic and adrenal gland lesions and liver mets, HTN, GI bleed, seizures well-controlled on keppra '500mg'$  bid, CAD and CHF with EF 20% s/p AICD who presented with sudden onset aphasia.  While en route to the hospital, he had a seizure and was treated with Versed.  He required intubation in the ED and became pulseless while in CT requiring one round of CPR with epinephrine.  He had frequent runs of v-tach at that time.  He is currently intubated, sedated and requiring pressors to maintain MAP goal.  Initial head CT demonstrated questionable dense left MCA, but CTA revealed no LVO.  Subjective: Patient is on ventilator and appears comfortable. He is on Fentanyl drip @ 273mg levo drip @ 955m and heparin gtt.   No family at the bedside.  His eyes are closed. Opens eyes when name is called. He is following very simple commands this am. He attempts to track examiner, he does not cross midline to gaze leftward.He grimaces to pain. RN at the bedside. Patient had been on CPAP this am. Rn states he has had some eye twitching.   Exam: Vitals:   09/01/22 0800 09/01/22 0815  BP:    Pulse: (!) 53 (!) 54  Resp: 18 16  Temp: 98.2 F (36.8 C) 98.2 F (36.8 C)  SpO2: 100% 100%   Gen: In bed, intubated and sedated with fentanyl Resp: respirations synchronous with ventilator  Neurological:  He is following simple commands, can stick out tongue, squeeze and unsqueeze hand, can lift bilateral legs on command. pupils pinpoint and reactive, eyes midline, able to look to the right, unable to cross midline to look left,  (+) corneals, cough, gag. Hearing intact to voice. Moves all 4 extremities spontaneously. Sensation intact to noxious stimuli. . Marland Kitchen Pertinent Labs: ABG    Component Value Date/Time   PHART 7.475 (H) 08/30/2022 2022   PCO2ART 28.7 (L) 08/30/2022 2022   PO2ART 117 (H)  08/30/2022 2022   HCO3 21.0 08/30/2022 2022   TCO2 22 08/30/2022 2022   ACIDBASEDEF 1.0 08/30/2022 2022   O2SAT 99 08/30/2022 2022       Latest Ref Rng & Units 09/01/2022    5:01 AM 08/31/2022    4:19 AM 08/30/2022    8:22 PM  CBC  WBC 4.0 - 10.5 K/uL 11.7  16.9    Hemoglobin 13.0 - 17.0 g/dL 11.9  14.8  16.0   Hematocrit 39.0 - 52.0 % 33.7  43.0  47.0   Platelets 150 - 400 K/uL 127  153         Latest Ref Rng & Units 09/01/2022    5:01 AM 08/31/2022    4:19 AM 08/30/2022    8:22 PM  BMP  Glucose 70 - 99 mg/dL 129  144    BUN 8 - 23 mg/dL 17  20    Creatinine 0.61 - 1.24 mg/dL 0.90  1.15    Sodium 135 - 145 mmol/L 138  132  135   Potassium 3.5 - 5.1 mmol/L 2.9  3.8  2.9   Chloride 98 - 111 mmol/L 105  99    CO2 22 - 32 mmol/L 23  21    Calcium 8.9 - 10.3 mg/dL 8.1  8.0     Lipid Panel     Component Value Date/Time   CHOL 137 08/31/2022 0419   CHOL 137 12/30/2019 1242  TRIG 51 08/31/2022 0419   HDL 54 08/31/2022 0419   HDL 37 (L) 12/30/2019 1242   CHOLHDL 2.5 08/31/2022 0419   VLDL 10 08/31/2022 0419   LDLCALC 73 08/31/2022 0419   LDLCALC 85 12/30/2019 1242   LABVLDL 15 12/30/2019 1242   A1c pending  Imaging Reviewed:  CT-scan of the brain: Questionable hyperdense left MCA   CTA head and neck: negative for LVO, 70% stenosis of right ICA, severe calcific stenosis of right vertebral artery   MRI examination of the brain pending, will need to obtain tomorrow as patient has AICD  LTM EEG 9/17 shows no seizure activity  LTM EEG 9/18:This study is suggestive of severe diffuse encephalopathy, nonspecific etiology but could be related to sedation. No seizures or epileptiform discharges were seen throughout the recording.    Echocardiogram 9/16 EF 35-40%, LVH and hypokinesis of anteroseptal wall, anterior wall and apical segment  LABS: A1c 6.3 LDL-c 73  Assessment: 61 year old patient with history of stage IV colon cancer, CHF with 20% EF and ACID, a-fib on Eliquis,  HTN and seizures who presented with status epilepticus.  While in the ED, he required intubation, and he became pulseless requiring one round of CPR in CT.  Patient continues to require norepinephrine to maintain blood pressure.   - On today's exam, he appears to have no seizure activity but is still minimally responsive with only extensor posturing to sternal rub.   - LTM EEG report for today AM: This study is suggestive of severe diffuse encephalopathy, nonspecific etiology but could be related to sedation. No seizures or epileptiform discharges were seen throughout the recording. - He may have anoxic brain injury from cardiac arrest. However, downtime was brief (<5 minutes) and high quality CPR was provided.   - Will need MRI with and without contrast to evaluate for stroke, anoxic brain injury and possible brain mets from known colon cancer.  Given that patient initially presented with sudden onset aphasia, stroke is possible and will not be able to rule out until MRI is done.  Impression:  - Seizures  - Possible stroke and/or anoxic brain injury following cardiac arrest  - Significant comorbidities include colon cancer and CHF  Recommendations: - Keppra '1500mg'$  q12h - Discontinue LTM EEG - MRI brain with and without contrast when stable for further imaging - Add atorvastatin '20mg'$  daily  - q4 hr neuro checks - Amb referral to neurology upon discharge   - Management of cardiac issues per CCM  August Albino DNP, ACNPC-AG  [facility] See Amion for schedule and pager information 09/01/2022 9:37 AM  Electronically signed: Dr. Kerney Elbe

## 2022-09-02 ENCOUNTER — Inpatient Hospital Stay (HOSPITAL_COMMUNITY): Payer: Commercial Managed Care - HMO

## 2022-09-02 ENCOUNTER — Inpatient Hospital Stay: Payer: Commercial Managed Care - HMO

## 2022-09-02 DIAGNOSIS — I469 Cardiac arrest, cause unspecified: Secondary | ICD-10-CM | POA: Diagnosis not present

## 2022-09-02 DIAGNOSIS — R569 Unspecified convulsions: Secondary | ICD-10-CM | POA: Diagnosis not present

## 2022-09-02 LAB — CULTURE, RESPIRATORY W GRAM STAIN: Culture: NORMAL

## 2022-09-02 LAB — BASIC METABOLIC PANEL
Anion gap: 6 (ref 5–15)
Anion gap: 5 (ref 5–15)
BUN: 16 mg/dL (ref 8–23)
BUN: 13 mg/dL (ref 8–23)
CO2: 25 mmol/L (ref 22–32)
CO2: 24 mmol/L (ref 22–32)
Calcium: 7.7 mg/dL — ABNORMAL LOW (ref 8.9–10.3)
Calcium: 7.4 mg/dL — ABNORMAL LOW (ref 8.9–10.3)
Chloride: 106 mmol/L (ref 98–111)
Chloride: 110 mmol/L (ref 98–111)
Creatinine, Ser: 0.9 mg/dL (ref 0.61–1.24)
Creatinine, Ser: 0.77 mg/dL (ref 0.61–1.24)
GFR, Estimated: >60 mL/min (ref >60)
GFR, Estimated: >60 mL/min (ref >60)
Glucose, Bld: 177 mg/dL — ABNORMAL HIGH (ref 70–99)
Glucose, Bld: 170 mg/dL — ABNORMAL HIGH (ref 70–99)
Potassium: 3.1 mmol/L — ABNORMAL LOW (ref 3.5–5.1)
Potassium: 3.9 mmol/L (ref 3.5–5.1)
Sodium: 137 mmol/L (ref 135–145)
Sodium: 139 mmol/L (ref 135–145)

## 2022-09-02 LAB — TSH: TSH: 43.17 u[IU]/mL — ABNORMAL HIGH (ref 0.350–4.500)

## 2022-09-02 LAB — PHOSPHORUS
Phosphorus: <1 mg/dL — CL (ref 2.5–4.6)
Phosphorus: 2.8 mg/dL (ref 2.5–4.6)

## 2022-09-02 LAB — GLUCOSE, CAPILLARY
Glucose-Capillary: 110 mg/dL — ABNORMAL HIGH (ref 70–99)
Glucose-Capillary: 132 mg/dL — ABNORMAL HIGH (ref 70–99)
Glucose-Capillary: 142 mg/dL — ABNORMAL HIGH (ref 70–99)
Glucose-Capillary: 148 mg/dL — ABNORMAL HIGH (ref 70–99)
Glucose-Capillary: 153 mg/dL — ABNORMAL HIGH (ref 70–99)
Glucose-Capillary: 162 mg/dL — ABNORMAL HIGH (ref 70–99)
Glucose-Capillary: 165 mg/dL — ABNORMAL HIGH (ref 70–99)
Glucose-Capillary: 168 mg/dL — ABNORMAL HIGH (ref 70–99)
Glucose-Capillary: 190 mg/dL — ABNORMAL HIGH (ref 70–99)
Glucose-Capillary: 191 mg/dL — ABNORMAL HIGH (ref 70–99)

## 2022-09-02 LAB — CBC
HCT: 29.9 % — ABNORMAL LOW (ref 39.0–52.0)
Hemoglobin: 10.3 g/dL — ABNORMAL LOW (ref 13.0–17.0)
MCH: 31.7 pg (ref 26.0–34.0)
MCHC: 34.4 g/dL (ref 30.0–36.0)
MCV: 92 fL (ref 80.0–100.0)
Platelets: 111 10*3/uL — ABNORMAL LOW (ref 150–400)
RBC: 3.25 MIL/uL — ABNORMAL LOW (ref 4.22–5.81)
RDW: 20.3 % — ABNORMAL HIGH (ref 11.5–15.5)
WBC: 9.9 10*3/uL (ref 4.0–10.5)
nRBC: 0 % (ref 0.0–0.2)

## 2022-09-02 LAB — MAGNESIUM
Magnesium: 2 mg/dL (ref 1.7–2.4)
Magnesium: 2.1 mg/dL (ref 1.7–2.4)

## 2022-09-02 LAB — APTT
aPTT: 60 seconds — ABNORMAL HIGH (ref 24–36)
aPTT: 71 seconds — ABNORMAL HIGH (ref 24–36)

## 2022-09-02 LAB — CULTURE, BLOOD (ROUTINE X 2): Special Requests: ADEQUATE

## 2022-09-02 LAB — T4, FREE: Free T4: 0.48 ng/dL — ABNORMAL LOW (ref 0.61–1.12)

## 2022-09-02 LAB — HEPARIN LEVEL (UNFRACTIONATED): Heparin Unfractionated: 1.01 IU/mL — ABNORMAL HIGH (ref 0.30–0.70)

## 2022-09-02 MED ORDER — THIAMINE MONONITRATE 100 MG PO TABS: 100.0000 mg | ORAL_TABLET | Freq: Every day | ORAL | Status: DC

## 2022-09-02 MED ORDER — POTASSIUM CHLORIDE 20 MEQ PO PACK
40.0000 meq | PACK | Freq: Two times a day (BID) | ORAL | Status: DC
  Administered 2022-09-02 (×2): 40 meq
  Filled 2022-09-02 (×2): qty 2

## 2022-09-02 MED ORDER — POTASSIUM PHOSPHATES 15 MMOLE/5ML IV SOLN
45.0000 mmol | Freq: Once | INTRAVENOUS | Status: DC
  Filled 2022-09-02: qty 15

## 2022-09-02 MED ORDER — THIAMINE HCL 100 MG/ML IJ SOLN
100.0000 mg | Freq: Every day | INTRAMUSCULAR | Status: AC
  Administered 2022-09-02 – 2022-09-04 (×3): 100 mg via INTRAVENOUS
  Filled 2022-09-02 (×3): qty 2

## 2022-09-02 MED ORDER — POTASSIUM PHOSPHATES 15 MMOLE/5ML IV SOLN
45.0000 mmol | Freq: Once | INTRAVENOUS | Status: AC
  Administered 2022-09-02: 45 mmol via INTRAVENOUS
  Filled 2022-09-02: qty 15

## 2022-09-02 MED ORDER — DEXMEDETOMIDINE HCL IN NACL 400 MCG/100ML IV SOLN
0.4000 ug/kg/h | INTRAVENOUS | Status: DC
  Administered 2022-09-02: 0.5 ug/kg/h via INTRAVENOUS
  Administered 2022-09-02: 0.4 ug/kg/h via INTRAVENOUS
  Administered 2022-09-02: 0.7 ug/kg/h via INTRAVENOUS
  Administered 2022-09-03: 0.8 ug/kg/h via INTRAVENOUS
  Filled 2022-09-02 (×4): qty 100

## 2022-09-02 MED ORDER — VITAL 1.5 CAL PO LIQD
1000.0000 mL | ORAL | Status: DC
  Administered 2022-09-02: 1000 mL

## 2022-09-02 MED ORDER — INSULIN ASPART 100 UNIT/ML IJ SOLN
0.0000 [IU] | INTRAMUSCULAR | Status: DC
  Administered 2022-09-02: 1 [IU] via SUBCUTANEOUS
  Administered 2022-09-02 (×2): 2 [IU] via SUBCUTANEOUS
  Administered 2022-09-02: 1 [IU] via SUBCUTANEOUS
  Administered 2022-09-02: 2 [IU] via SUBCUTANEOUS
  Administered 2022-09-02 – 2022-09-04 (×9): 1 [IU] via SUBCUTANEOUS
  Administered 2022-09-05: 2 [IU] via SUBCUTANEOUS
  Administered 2022-09-05: 1 [IU] via SUBCUTANEOUS
  Administered 2022-09-05: 2 [IU] via SUBCUTANEOUS

## 2022-09-02 MED ORDER — LEVOTHYROXINE SODIUM 50 MCG PO TABS
50.0000 ug | ORAL_TABLET | Freq: Every day | ORAL | Status: DC
  Administered 2022-09-02 – 2022-09-03 (×2): 50 ug
  Filled 2022-09-02 (×2): qty 1

## 2022-09-02 NOTE — Progress Notes (Signed)
Date and time results received: 09/02/22 1203  Test: Phosphorus Critical Value: < 1.0  Name of Provider Notified: Dr. Lynetta Mare aware  Orders Received? Or Actions Taken?: Orders received

## 2022-09-02 NOTE — Progress Notes (Signed)
OT Cancellation Note  Patient Details Name: Gregory [name] MRN: [id number] DOB: 1961-05-08   Cancelled Treatment:    Reason Eval/Treat Not Completed: Medical issues which prohibited therapy- pt with increased agitation with medication changes.  Asking OT to hold at this time, plan to check back tomorrow.   Jolaine Artist, OT Acute Rehabilitation Services Office [phone]   Delight Stare 09/02/2022, 11:05 AM

## 2022-09-02 NOTE — Progress Notes (Signed)
ANTICOAGULATION CONSULT NOTE - Follow Up Consult  Pharmacy Consult for heparin Indication: Afib  No Known Allergies  Patient Measurements: Weight: 93.3 kg (205 lb 11 oz) Heparin Dosing Weight: 89kg  Vital Signs: Temp: 99.7 F (37.6 C) (09/19 0045) Temp Source: Bladder (09/19 0000) BP: 145/72 (09/19 0000) Pulse Rate: 50 (09/19 0045)  Labs: Recent Labs    08/30/22 1231 08/30/22 1402 08/30/22 1427 08/30/22 1752 08/30/22 1845 08/30/22 1919 08/30/22 2022 08/31/22 0049 08/31/22 0419 08/31/22 1034 09/01/22 0501 09/01/22 0827 09/01/22 1333 09/01/22 1714 09/01/22 2330  HGB 18.0*  17.0   < >  --    < >  --   --  16.0  --  14.8  --  11.9*  --   --   --   --   HCT 53.0*  48.5   < >  --    < >  --   --  47.0  --  43.0  --  33.7*  --   --   --   --   PLT 156  --   --   --   --   --   --   --  153  --  127*  --   --   --   --   APTT 33  --   --   --   --   --   --  101*  --    < > 54*  --  53*  --  60*  LABPROT 19.2*  --   --   --   --   --   --   --   --   --   --   --   --   --   --   INR 1.6*  --   --   --   --   --   --   --   --   --   --   --   --   --   --   HEPARINUNFRC  --   --   --   --   --   --   --  >1.10*  --   --  1.06*  --   --   --   --   CREATININE 1.00  --  1.20  --   --    < >  --   --  1.15  --  0.90 1.00  --  0.90  --   TROPONINIHS  --   --  46*  --  90*  --   --   --   --   --   --   --   --   --   --    < > = values in this interval not displayed.     Estimated Creatinine Clearance: 108.6 mL/min (by C-G formula based on SCr of 0.9 mg/dL).   Medical History: Past Medical History:  Diagnosis Date   AICD (automatic cardioverter/defibrillator) present 2005   CAD (coronary artery disease) 12/01/2013   Chronic combined systolic and diastolic CHF, NYHA class 1 ([place]) 12/01/2013   Erectile dysfunction 12/01/2013   HTN (hypertension) 12/01/2013   Hyperlipidemia 12/01/2013   Ischemic cardiomyopathy 12/01/2013   Presence of permanent cardiac pacemaker     Sleep apnea     Assessment: 68 YOM presenting as code stroke, PEA arrest in CT, negative LVO on CTA.  Hx CAD, afib on Eliquis PTA  Heparin level high due to  recent eliquis use.  APTT is 60 sec approaching lower limit of goal with heparin drip rate increase 1150 uts/hr   No bleeding noted cbc stable  S/p possible cva will keep on lower end of range    Goal of Therapy:  Heparin level 0.3-0.5 Aptt 66-90  sec Monitor platelets by anticoagulation protocol: Yes   Plan:  Increase Heparin gtt to 1250  units/hr, no bolus Daily aptt heparin level and cbc    Bonnita Nasuti Pharm.D. CPP, BCPS Clinical Pharmacist [phone] 09/02/2022 1:12 AM   Please see AMION for all Pharmacists' Contact Phone Numbers 09/02/2022, 1:11 AM

## 2022-09-02 NOTE — Evaluation (Signed)
Physical Therapy Evaluation Patient Details Name: Gregory [name] MRN: [id number] DOB: Nov 01, 1961 Today's Date: 09/02/2022  History of Present Illness  61 yo male admitted 9/16 with AMS as code stroke. Decompensated with intubation in ED and brief arrest in CT. Initial head CT demonstrated questionable dense left MCA, but CTA revealed no LVO. EEG with encephalopathy.  PMHx metastatic colon cancer, HFrEF, VT s/p ICD, Afib, HTN -poorly controlled, DM, CAD, seizure disorder, recent intraabdominal abscess s/p drain  Clinical Impression  Pt with flat affect, eyes open, tendency for right visual attention with lack of ability to attempt to communicate with questions. Pt able to move all extremities on command inconsistently and assist to pull trunk off surface in chair position. PLOF taken from previous admission at which time pt independent with some assist from family for driving and errands and no family present to confirm if functional changes since that time. Pt with decreased cognition, cardiopulmonary function, generalized weakness and impaired mobility who will benefit from acute therapy to maximize mobility and safety.   Pt on PRVC 40%, peep 8 initially with RT switching to PS/CPAP during session with need for cues to take breaths, ETT 26cm HR 64-70 SpO2 100%   Pre activity 155/80 supine with HOB 30, post session 144/87 in full chair     Recommendations for follow up therapy are one component of a multi-disciplinary discharge planning process, led by the attending physician.  Recommendations may be updated based on patient status, additional functional criteria and insurance authorization.  Follow Up Recommendations PT at Long-term acute care hospital      Assistance Recommended at Discharge Frequent or constant Supervision/Assistance  Patient can return home with the following  Two people to help with walking and/or transfers;Two people to help with  bathing/dressing/bathroom;Assistance with cooking/housework;Assistance with feeding;Assist for transportation;Direct supervision/assist for medications management    Equipment Recommendations Other (comment) (TBD)  Recommendations for Other Services       Functional Status Assessment Patient has had a recent decline in their functional status and/or demonstrates limited ability to make significant improvements in function in a reasonable and predictable amount of time     Precautions / Restrictions Precautions Precautions: Other (comment);Fall Precaution Comments: vent, cortrak, ETT      Mobility  Bed Mobility Overal bed mobility: Needs Assistance             General bed mobility comments: bed egress positioning utilized to achieve full chair. Min assist with bil UE on rails and mod cues to pull trunk forward into unsupported sitting x 3 trials    Transfers                   General transfer comment: not yet ready to attempt    Ambulation/Gait                  Stairs            Wheelchair Mobility    Modified Rankin (Stroke Patients Only)       Balance                                             Pertinent Vitals/Pain Pain Assessment Pain Assessment: CPOT Facial Expression: Relaxed, neutral Body Movements: Absence of movements Muscle Tension: Relaxed Compliance with ventilator (intubated pts.): Tolerating ventilator or movement Vocalization (extubated pts.): N/A CPOT Total: 0 Pain Intervention(s):  Repositioned, Monitored during session    Home Living Family/patient expects to be discharged to:: Private residence Living Arrangements: Children Available Help at Discharge: Family;Available PRN/intermittently Type of Home: House Home Access: Level entry     Alternate Level Stairs-Number of Steps: 14 Home Layout: Two level;Bed/bath upstairs Home Equipment: Shower seat;Hand held shower head Additional Comments:  home setup and PLOF taken from prior admission as pt unable to provide    Prior Function Prior Level of Function : Independent/Modified Independent             Mobility Comments: pt independent and going to the gym ADLs Comments: son driving and running errands     Hand Dominance        Extremity/Trunk Assessment   Upper Extremity Assessment Upper Extremity Assessment: Generalized weakness (grossly 2/5 difficult to fully assess due to cognition)    Lower Extremity Assessment Lower Extremity Assessment: Generalized weakness (grossly 2/5 difficult to fully assess due to cognition)    Cervical / Trunk Assessment Cervical / Trunk Assessment: Normal  Communication   Communication: Other (comment) (ETT, vent)  Cognition Arousal/Alertness: Awake/alert Behavior During Therapy: Flat affect Overall Cognitive Status: Impaired/Different from baseline Area of Impairment: Following commands                       Following Commands: Follows one step commands inconsistently, Follows one step commands with increased time       General Comments: pt without attempts at head nods/shake or gesturing to communicate, able to follow simple single step commands inconsistently with delay at times. Tendency for right visual attention with lack of tracking        General Comments      Exercises General Exercises - Upper Extremity Shoulder Flexion: AAROM, Both, 5 reps, Seated General Exercises - Lower Extremity Short Arc Quad: AAROM, Both, 10 reps, Seated Hip ABduction/ADduction: PROM, Both, 10 reps, Seated Hip Flexion/Marching: AAROM, Both, 5 reps, Seated   Assessment/Plan    PT Assessment Patient needs continued PT services  PT Problem List Decreased strength;Decreased mobility;Decreased safety awareness;Decreased activity tolerance;Decreased balance;Decreased knowledge of use of DME;Decreased cognition;Cardiopulmonary status limiting activity       PT Treatment  Interventions DME instruction;Therapeutic exercise;Balance training;Functional mobility training;Therapeutic activities;Patient/family education;Cognitive remediation;Neuromuscular re-education;Gait training    PT Goals (Current goals can be found in the Care Plan section)  Acute Rehab PT Goals PT Goal Formulation: Patient unable to participate in goal setting Time For Goal Achievement: 09/16/22 Potential to Achieve Goals: Fair    Frequency Min 3X/week     Co-evaluation               AM-PAC PT "6 Clicks" Mobility  Outcome Measure Help needed turning from your back to your side while in a flat bed without using bedrails?: Total Help needed moving from lying on your back to sitting on the side of a flat bed without using bedrails?: Total Help needed moving to and from a bed to a chair (including a wheelchair)?: Total Help needed standing up from a chair using your arms (e.g., wheelchair or bedside chair)?: Total Help needed to walk in hospital room?: Total Help needed climbing 3-5 steps with a railing? : Total 6 Click Score: 6    End of Session Equipment Utilized During Treatment: Other (comment) (vent) Activity Tolerance: Patient tolerated treatment well Patient left: in bed;with bed alarm set;with nursing/sitter in room Nurse Communication: Mobility status;Need for lift equipment PT Visit Diagnosis: Other abnormalities of gait and  mobility (R26.89);Difficulty in walking, not elsewhere classified (R26.2);Muscle weakness (generalized) (M62.81)    Time: 6886-4847 PT Time Calculation (min) (ACUTE ONLY): 18 min   Charges:   PT Evaluation $PT Eval High Complexity: 1 High          Janaria Mccammon P, PT Acute Rehabilitation Services Office: [phone]   Lamarr Lulas 09/02/2022, 7:46 AM

## 2022-09-02 NOTE — Progress Notes (Signed)
[facility] ADULT ICU REPLACEMENT PROTOCOL   The patient does apply for the [facility] Adult ICU Electrolyte Replacment Protocol based on the criteria listed below:   1.Exclusion criteria: TCTS patients, ECMO patients, and Dialysis patients 2. Is GFR >/= 30 ml/min? Yes.    Patient's GFR today is >60 3. Is SCr </= 2? Yes.   Patient's SCr is 0.9 mg/dL 4. Did SCr increase >/= 0.5 in 24 hours? No. 5.Pt's weight >40kg  Yes.   6. Abnormal electrolyte(s): K 3.1, Phos 1  7. Electrolytes replaced per protocol 8.  Call MD STAT for K+ </= 2.5, Phos </= 1, or Mag </= 1 Physician:  Smitty Pluck, Theodoros Stjames A 09/02/2022 6:38 AM

## 2022-09-02 NOTE — Progress Notes (Signed)
Brief HPI: Patient with a history of atrial fibrillation on Eliquis, colon cancer stage IV with pancreatic and adrenal gland lesions and liver mets, HTN, GI bleed, seizures well-controlled on keppra '500mg'$  bid, CAD and CHF with EF 20% s/p AICD who presented with sudden onset aphasia.  While en route to the hospital, he had a seizure and was treated with Versed.  He required intubation in the ED and became pulseless while in CT requiring one round of CPR with epinephrine.  He had frequent runs of v-tach at that time.  He is currently intubated, sedated and requiring pressors to maintain MAP goal.  Initial head CT demonstrated questionable dense left MCA, but CTA revealed no LVO.    Subjective: Patient is on ventilator and appears comfortable. Fentanyl drip has been discontinued.   Objective: Current vital signs: BP (!) 145/72 (BP Location: Right Arm)   Pulse 64   Temp 99.7 F (37.6 C)   Resp 17   Wt 97.8 kg   SpO2 100%   BMI 24.92 kg/m  Vital signs in last 24 hours: Temp:  [99.1 F (37.3 C)-100 F (37.8 C)] 99.7 F (37.6 C) (09/19 0045) Pulse Rate:  [49-82] 64 (09/19 0736) Resp:  [0-22] 17 (09/19 0045) BP: (92-164)/(60-103) 145/72 (09/19 0000) SpO2:  [98 %-100 %] 100 % (09/19 0831) Arterial Line BP: (86-189)/(54-123) 154/74 (09/19 0045) FiO2 (%):  [30 %-40 %] 40 % (09/19 0736) Weight:  [97.8 kg] 97.8 kg (09/19 0500)  Intake/Output from previous day: 09/18 0701 - 09/19 0700 In: 2894.1 [I.V.:1635; NG/GT:371.7; IV Piggyback:887.4] Out: 1255 [Urine:1255] Intake/Output this shift: No intake/output data recorded. Nutritional status:  Diet Order             Diet NPO time specified  Diet effective now                  HEENT: [place]/AT Lungs: Intubated  Neurologic Exam: He is following simple commands as he was also doing yesterday. Can stick out tongue, squeeze and unsqueeze hands on the right and left to command and can wiggle toes bilaterally. CN: PERRL. Blinks to threat in  temporal visual fields of each eye. Eyes are conjugate; can track to the right but cannot cross midline to the left. Hearing intact to voice. Moves all 4 extremities spontaneously. Sensation intact to noxious stimuli. .   Lab Results: Results for orders placed or performed during the hospital encounter of 08/30/22 (from the past 48 hour(s))  APTT     Status: Abnormal   Collection Time: 08/31/22 10:34 AM  Result Value Ref Range   aPTT 103 (H) 24 - 36 seconds    Comment:        IF BASELINE aPTT IS ELEVATED, SUGGEST PATIENT RISK ASSESSMENT BE USED TO DETERMINE APPROPRIATE ANTICOAGULANT THERAPY. Performed at [facility], [place] [address], [place], [place] [address]   CBC     Status: Abnormal   Collection Time: 09/01/22  5:01 AM  Result Value Ref Range   WBC 11.7 (H) 4.0 - 10.5 K/uL   RBC 3.76 (L) 4.22 - 5.81 MIL/uL   Hemoglobin 11.9 (L) 13.0 - 17.0 g/dL   HCT 33.7 (L) 39.0 - 52.0 %   MCV 89.6 80.0 - 100.0 fL   MCH 31.6 26.0 - 34.0 pg   MCHC 35.3 30.0 - 36.0 g/dL   RDW 20.1 (H) 11.5 - 15.5 %   Platelets 127 (L) 150 - 400 K/uL   nRBC 0.0 0.0 - 0.2 %  Comment: Performed at [facility], [place] [address], [place], [place] [address]  Hemoglobin A1c     Status: Abnormal   Collection Time: 09/01/22  5:01 AM  Result Value Ref Range   Hgb A1c MFr Bld 6.3 (H) 4.8 - 5.6 %    Comment: (NOTE) Pre diabetes:          5.7%-6.4%  Diabetes:              >6.4%  Glycemic control for   <7.0% adults with diabetes    Mean Plasma Glucose 134.11 mg/dL    Comment: Performed at [facility] [address] [place], [place] [address]  Basic metabolic panel     Status: Abnormal   Collection Time: 09/01/22  5:01 AM  Result Value Ref Range   Sodium 138 135 - 145 mmol/L   Potassium 2.9 (L) 3.5 - 5.1 mmol/L   Chloride 105 98 - 111 mmol/L   CO2 23 22 - 32 mmol/L   Glucose, Bld 129 (H) 70 - 99 mg/dL    Comment: Glucose reference range applies only to samples taken after fasting for at  least 8 hours.   BUN 17 8 - 23 mg/dL   Creatinine, Ser 0.90 0.61 - 1.24 mg/dL   Calcium 8.1 (L) 8.9 - 10.3 mg/dL   GFR, Estimated >60 >60 mL/min    Comment: (NOTE) Calculated using the CKD-EPI Creatinine Equation (2021)    Anion gap 10 5 - 15    Comment: Performed at [facility] [name], [place], [place] [address]  APTT     Status: Abnormal   Collection Time: 09/01/22  5:01 AM  Result Value Ref Range   aPTT 54 (H) 24 - 36 seconds    Comment:        IF BASELINE aPTT IS ELEVATED, SUGGEST PATIENT RISK ASSESSMENT BE USED TO DETERMINE APPROPRIATE ANTICOAGULANT THERAPY. Performed at [facility], [place] [address], [place], [place] [address]   Heparin level (unfractionated)     Status: Abnormal   Collection Time: 09/01/22  5:01 AM  Result Value Ref Range   Heparin Unfractionated 1.06 (H) 0.30 - 0.70 IU/mL    Comment: (NOTE) The clinical reportable range upper limit is being lowered to >1.10 to align with the FDA approved guidance for the current laboratory assay.  If heparin results are below expected values, and patient dosage has  been confirmed, suggest follow up testing of antithrombin III levels. Performed at [facility], [place] [address], [place], [place] [address]   Basic metabolic panel     Status: Abnormal   Collection Time: 09/01/22  8:27 AM  Result Value Ref Range   Sodium 138 135 - 145 mmol/L   Potassium 3.1 (L) 3.5 - 5.1 mmol/L   Chloride 102 98 - 111 mmol/L   CO2 24 22 - 32 mmol/L   Glucose, Bld 124 (H) 70 - 99 mg/dL    Comment: Glucose reference range applies only to samples taken after fasting for at least 8 hours.   BUN 17 8 - 23 mg/dL   Creatinine, Ser 1.00 0.61 - 1.24 mg/dL   Calcium 8.1 (L) 8.9 - 10.3 mg/dL   GFR, Estimated >60 >60 mL/min    Comment: (NOTE) Calculated using the CKD-EPI Creatinine Equation (2021)    Anion gap 12 5 - 15    Comment: Performed at [facility] [address], [place], [place] [address]  Magnesium     Status: None   Collection Time: 09/01/22  8:27 AM  Result Value Ref Range   Magnesium 2.0 1.7 - 2.4 mg/dL    Comment: Performed at [facility], [place] [address], [place], [place] [address]  Phosphorus     Status: Abnormal   Collection Time: 09/01/22  8:27 AM  Result Value Ref Range   Phosphorus 2.2 (L) 2.5 - 4.6 mg/dL    Comment: Performed at [facility] [address] [place], [place] [address]  Glucose, capillary     Status: Abnormal   Collection Time: 09/01/22 12:43 PM  Result Value Ref Range   Glucose-Capillary 125 (H) 70 - 99 mg/dL    Comment: Glucose reference range applies only to samples taken after fasting for at least 8 hours.  APTT     Status: Abnormal   Collection Time: 09/01/22  1:33 PM  Result Value Ref Range   aPTT 53 (H) 24 - 36 seconds    Comment:        IF BASELINE aPTT IS ELEVATED, SUGGEST PATIENT RISK ASSESSMENT BE USED TO DETERMINE APPROPRIATE ANTICOAGULANT THERAPY. Performed at [facility], [place] [address] [place], [place] [address]   Glucose, capillary     Status: Abnormal   Collection Time: 09/01/22  4:06 PM  Result Value Ref Range   Glucose-Capillary 135 (H) 70 - 99 mg/dL    Comment: Glucose reference range applies only to samples taken after fasting for at least 8 hours.  Magnesium     Status: None   Collection Time: 09/01/22  5:14 PM  Result Value Ref Range   Magnesium 2.0 1.7 - 2.4 mg/dL    Comment: Performed at [facility], [place] [address], [place], [place] [address]  Phosphorus     Status: Abnormal   Collection Time: 09/01/22  5:14 PM  Result Value Ref Range   Phosphorus 1.6 (L) 2.5 - 4.6 mg/dL    Comment: Performed at [facility] [address], [place], [place] [address]  Basic metabolic panel     Status: Abnormal   Collection Time: 09/01/22  5:14 PM  Result Value Ref Range   Sodium 135 135 - 145 mmol/L   Potassium 3.8 3.5 - 5.1 mmol/L   Chloride 105 98 - 111 mmol/L   CO2 22 22 - 32  mmol/L   Glucose, Bld 143 (H) 70 - 99 mg/dL    Comment: Glucose reference range applies only to samples taken after fasting for at least 8 hours.   BUN 19 8 - 23 mg/dL   Creatinine, Ser 0.90 0.61 - 1.24 mg/dL   Calcium 7.7 (L) 8.9 - 10.3 mg/dL   GFR, Estimated >60 >60 mL/min    Comment: (NOTE) Calculated using the CKD-EPI Creatinine Equation (2021)    Anion gap 8 5 - 15    Comment: Performed at [facility] [address] [place], [place] [address]  Glucose, capillary     Status: Abnormal   Collection Time: 09/01/22  7:52 PM  Result Value Ref Range   Glucose-Capillary 150 (H) 70 - 99 mg/dL    Comment: Glucose reference range applies only to samples taken after fasting for at least 8 hours.  APTT     Status: Abnormal   Collection Time: 09/01/22 11:30 PM  Result Value Ref Range   aPTT 60 (H) 24 - 36 seconds    Comment:  IF BASELINE aPTT IS ELEVATED, SUGGEST PATIENT RISK ASSESSMENT BE USED TO DETERMINE APPROPRIATE ANTICOAGULANT THERAPY. Performed at [facility], [place] [address], [place], [place] [address]   Glucose, capillary     Status: Abnormal   Collection Time: 09/02/22 12:07 AM  Result Value Ref Range   Glucose-Capillary 190 (H) 70 - 99 mg/dL    Comment: Glucose reference range applies only to samples taken after fasting for at least 8 hours.  Glucose, capillary     Status: Abnormal   Collection Time: 09/02/22 12:40 AM  Result Value Ref Range   Glucose-Capillary 191 (H) 70 - 99 mg/dL    Comment: Glucose reference range applies only to samples taken after fasting for at least 8 hours.  Glucose, capillary     Status: Abnormal   Collection Time: 09/02/22  2:18 AM  Result Value Ref Range   Glucose-Capillary 165 (H) 70 - 99 mg/dL    Comment: Glucose reference range applies only to samples taken after fasting for at least 8 hours.  CBC     Status: Abnormal   Collection Time: 09/02/22  4:05 AM  Result Value Ref Range   WBC 9.9 4.0 - 10.5 K/uL   RBC 3.25  (L) 4.22 - 5.81 MIL/uL   Hemoglobin 10.3 (L) 13.0 - 17.0 g/dL   HCT 29.9 (L) 39.0 - 52.0 %   MCV 92.0 80.0 - 100.0 fL   MCH 31.7 26.0 - 34.0 pg   MCHC 34.4 30.0 - 36.0 g/dL   RDW 20.3 (H) 11.5 - 15.5 %   Platelets 111 (L) 150 - 400 K/uL   nRBC 0.0 0.0 - 0.2 %    Comment: Performed at [facility] [address] [place], [place] [address]  Basic metabolic panel     Status: Abnormal   Collection Time: 09/02/22  4:05 AM  Result Value Ref Range   Sodium 137 135 - 145 mmol/L   Potassium 3.1 (L) 3.5 - 5.1 mmol/L   Chloride 106 98 - 111 mmol/L   CO2 25 22 - 32 mmol/L   Glucose, Bld 177 (H) 70 - 99 mg/dL    Comment: Glucose reference range applies only to samples taken after fasting for at least 8 hours.   BUN 16 8 - 23 mg/dL   Creatinine, Ser 0.90 0.61 - 1.24 mg/dL   Calcium 7.7 (L) 8.9 - 10.3 mg/dL   GFR, Estimated >60 >60 mL/min    Comment: (NOTE) Calculated using the CKD-EPI Creatinine Equation (2021)    Anion gap 6 5 - 15    Comment: Performed at [facility] [address] [place], [place] [address]  TSH     Status: Abnormal   Collection Time: 09/02/22  4:05 AM  Result Value Ref Range   TSH 43.170 (H) 0.350 - 4.500 uIU/mL    Comment: Performed by a 3rd Generation assay with a functional sensitivity of <=0.01 uIU/mL. Performed at [facility], [place] [address], [place], [place] [address]   Magnesium     Status: None   Collection Time: 09/02/22  4:05 AM  Result Value Ref Range   Magnesium 2.0 1.7 - 2.4 mg/dL    Comment: Performed at [facility] [address] [place],  [address]  Phosphorus     Status: Abnormal   Collection Time: 09/02/22  4:05 AM  Result Value Ref Range   Phosphorus <1.0 (LL) 2.5 - 4.6 mg/dL    Comment: Performed  at [facility], [place] [address] [place], [place] [address]  Glucose, capillary     Status: Abnormal   Collection Time: 09/02/22  4:16 AM  Result Value Ref Range   Glucose-Capillary 162 (H) 70 - 99 mg/dL     Comment: Glucose reference range applies only to samples taken after fasting for at least 8 hours.   Comment 1 QC Due   Heparin level (unfractionated)     Status: Abnormal   Collection Time: 09/02/22  5:15 AM  Result Value Ref Range   Heparin Unfractionated 1.01 (H) 0.30 - 0.70 IU/mL    Comment: (NOTE) The clinical reportable range upper limit is being lowered to >1.10 to align with the FDA approved guidance for the current laboratory assay.  If heparin results are below expected values, and patient dosage has  been confirmed, suggest follow up testing of antithrombin III levels. Performed at [facility], [place] [address], [place], [place] [address]   Glucose, capillary     Status: Abnormal   Collection Time: 09/02/22  7:55 AM  Result Value Ref Range   Glucose-Capillary 142 (H) 70 - 99 mg/dL    Comment: Glucose reference range applies only to samples taken after fasting for at least 8 hours.    Recent Results (from the past 240 hour(s))  Culture, blood (Routine X 2) w Reflex to ID Panel     Status: None (Preliminary result)   Collection Time: 08/30/22  6:41 PM   Specimen: BLOOD RIGHT HAND  Result Value Ref Range Status   Specimen Description BLOOD RIGHT HAND  Final   Special Requests IN PEDIATRIC BOTTLE Blood Culture adequate volume  Final   Culture   Final    NO GROWTH 3 DAYS Performed at [facility], [address] [place], [place] [address]    Report Status PENDING  Incomplete  Culture, blood (Routine X 2) w Reflex to ID Panel     Status: Abnormal   Collection Time: 08/30/22  6:41 PM   Specimen: BLOOD RIGHT HAND  Result Value Ref Range Status   Specimen Description BLOOD RIGHT HAND  Final   Special Requests IN PEDIATRIC BOTTLE Blood Culture adequate volume  Final   Culture  Setup Time   Final    GRAM POSITIVE COCCI IN CLUSTERS IN PEDIATRIC BOTTLE CRITICAL RESULT CALLED TO, READ BACK BY AND VERIFIED WITH: K PIERCE,PHARMD'@2222'$  08/31/22 [place]    Culture (A)   Final    STAPHYLOCOCCUS CAPITIS THE SIGNIFICANCE OF ISOLATING THIS ORGANISM FROM A SINGLE SET OF BLOOD CULTURES WHEN MULTIPLE SETS ARE DRAWN IS UNCERTAIN. PLEASE NOTIFY THE MICROBIOLOGY DEPARTMENT WITHIN ONE WEEK IF SPECIATION AND SENSITIVITIES ARE REQUIRED. Performed at [facility], [place] [address], [place], [place] [address]    Report Status 09/02/2022 FINAL  Final  Blood Culture ID Panel (Reflexed)     Status: Abnormal   Collection Time: 08/30/22  6:41 PM  Result Value Ref Range Status   Enterococcus faecalis NOT DETECTED NOT DETECTED Final   Enterococcus Faecium NOT DETECTED NOT DETECTED Final   Listeria monocytogenes NOT DETECTED NOT DETECTED Final   Staphylococcus species DETECTED (A) NOT DETECTED Final    Comment: CRITICAL RESULT CALLED TO, READ BACK BY AND VERIFIED WITH: K PIERCE,PHARMD'@2222'$  08/31/22 [place]    Staphylococcus aureus (BCID) NOT DETECTED NOT DETECTED Final   Staphylococcus epidermidis NOT DETECTED NOT DETECTED Final   Staphylococcus lugdunensis NOT DETECTED NOT DETECTED Final   Streptococcus species NOT DETECTED  NOT DETECTED Final   Streptococcus agalactiae NOT DETECTED NOT DETECTED Final   Streptococcus pneumoniae NOT DETECTED NOT DETECTED Final   Streptococcus pyogenes NOT DETECTED NOT DETECTED Final   A.calcoaceticus-baumannii NOT DETECTED NOT DETECTED Final   Bacteroides fragilis NOT DETECTED NOT DETECTED Final   Enterobacterales NOT DETECTED NOT DETECTED Final   Enterobacter cloacae complex NOT DETECTED NOT DETECTED Final   Escherichia coli NOT DETECTED NOT DETECTED Final   Klebsiella aerogenes NOT DETECTED NOT DETECTED Final   Klebsiella oxytoca NOT DETECTED NOT DETECTED Final   Klebsiella pneumoniae NOT DETECTED NOT DETECTED Final   Proteus species NOT DETECTED NOT DETECTED Final   Salmonella species NOT DETECTED NOT DETECTED Final   Serratia marcescens NOT DETECTED NOT DETECTED Final   Haemophilus influenzae NOT DETECTED NOT DETECTED Final    Neisseria meningitidis NOT DETECTED NOT DETECTED Final   Pseudomonas aeruginosa NOT DETECTED NOT DETECTED Final   Stenotrophomonas maltophilia NOT DETECTED NOT DETECTED Final   Candida albicans NOT DETECTED NOT DETECTED Final   Candida auris NOT DETECTED NOT DETECTED Final   Candida glabrata NOT DETECTED NOT DETECTED Final   Candida krusei NOT DETECTED NOT DETECTED Final   Candida parapsilosis NOT DETECTED NOT DETECTED Final   Candida tropicalis NOT DETECTED NOT DETECTED Final   Cryptococcus neoformans/gattii NOT DETECTED NOT DETECTED Final    Comment: Performed at [facility], [address], [place], [place] [address]  MRSA Next Gen by PCR, Nasal     Status: None   Collection Time: 08/30/22  6:45 PM   Specimen: Nasal Mucosa; Nasal Swab  Result Value Ref Range Status   MRSA by PCR Next Gen NOT DETECTED NOT DETECTED Final    Comment: (NOTE) The GeneXpert MRSA Assay (FDA approved for NASAL specimens only), is one component of a comprehensive MRSA colonization surveillance program. It is not intended to diagnose MRSA infection nor to guide or monitor treatment for MRSA infections. Test performance is not FDA approved in patients less than 53 years old. Performed at [facility], [place] [address] [place], [place] [address]   Culture, Respiratory w Gram Stain     Status: None (Preliminary result)   Collection Time: 08/31/22 12:49 AM   Specimen: Tracheal Aspirate; Respiratory  Result Value Ref Range Status   Specimen Description TRACHEAL ASPIRATE  Final   Special Requests NONE  Final   Gram Stain   Final    MODERATE WBC PRESENT,BOTH PMN AND MONONUCLEAR MODERATE GRAM POSITIVE COCCI IN PAIRS AND CHAINS    Culture   Final    CULTURE REINCUBATED FOR BETTER GROWTH Performed at [facility], [place] [name], [place], [place] [address]    Report Status PENDING  Incomplete    Lipid Panel Recent Labs    08/31/22 0419  CHOL 137  TRIG 51  HDL 54  CHOLHDL 2.5  VLDL  10  LDLCALC 73    Studies/Results: DG Abd Portable 1V  Result Date: 09/02/2022 CLINICAL DATA:  Check gastric catheter placed EXAM: PORTABLE ABDOMEN - 1 VIEW COMPARISON:  08/31/2022 FINDINGS: Gastric catheter is noted extending into the third portion of the duodenum. Scattered large and small bowel gas is noted. No free air is noted. IMPRESSION: Gastric catheter deep in the duodenum Electronically Signed   By: Inez Catalina M.D.   On: 09/02/2022 02:33   DG CHEST PORT 1 VIEW  Result Date: 09/01/2022 CLINICAL DATA:  Endotracheal tube placement. EXAM: PORTABLE CHEST 1 VIEW COMPARISON:  09/01/2022. FINDINGS:  The heart size and mediastinal contours are stable. Minimal subsegmental atelectasis at the left lung base. No consolidation, effusion, or pneumothorax. A stable multi lead pacemaker device is present over the left chest. An endotracheal tube terminates 6.2 cm above the carina. An enteric tube courses over the midline and out of the field of view. IMPRESSION: 1. Mild subsegmental atelectasis at the left lung base. 2. Support apparatus as described above. Electronically Signed   By: [name] M.D.   On: 09/01/2022 01:36   DG CHEST PORT 1 VIEW  Result Date: 09/01/2022 CLINICAL DATA:  Endotracheal 2 placement EXAM: PORTABLE CHEST 1 VIEW COMPARISON:  09/01/2019 FINDINGS: Support Apparatus: --Endotracheal tube: Tip at the level of the clavicular heads. --Enteric tube:Tip and sideport are below the field of view. --Vascular catheter(s):None --Other: Left chest wall 3 lead AICD The heart size and mediastinal contours are within normal limits. The lungs are clear. No pleural effusion or pneumothorax. IMPRESSION: Endotracheal tube tip at the level of the clavicular heads. Electronically Signed   By: Ulyses Jarred M.D.   On: 09/01/2022 00:59   CT HEAD WO CONTRAST (5MM)  Result Date: 08/31/2022 CLINICAL DATA:  Acute neurologic deficit EXAM: CT HEAD WITHOUT CONTRAST TECHNIQUE: Contiguous axial images were  obtained from the base of the skull through the vertex without intravenous contrast. RADIATION DOSE REDUCTION: This exam was performed according to the departmental dose-optimization program which includes automated exposure control, adjustment of the mA and/or kV according to patient size and/or use of iterative reconstruction technique. COMPARISON:  08/30/2022 FINDINGS: Brain: There is no mass, hemorrhage or extra-axial collection. The size and configuration of the ventricles and extra-axial CSF spaces are normal. There is hypoattenuation of the white matter, most commonly indicating chronic small vessel disease. Partially empty sella. Vascular: No abnormal hyperdensity of the major intracranial arteries or dural venous sinuses. No intracranial atherosclerosis. Skull: The visualized skull base, calvarium and extracranial soft tissues are normal. Sinuses/Orbits: No fluid levels or advanced mucosal thickening of the visualized paranasal sinuses. No mastoid or middle ear effusion. The orbits are normal. IMPRESSION: Chronic small vessel disease without acute intracranial abnormality. Electronically Signed   By: Ulyses Jarred M.D.   On: 08/31/2022 21:09   DG Abd Portable 1V  Result Date: 08/31/2022 CLINICAL DATA:  Evaluate location of enteric tube EXAM: PORTABLE ABDOMEN - 1 VIEW COMPARISON:  08/30/2022 FINDINGS: Tip of enteric tube is noted in the fundus of the stomach pointing cephalad. There is a kink in the distal course of enteric tube in the stomach. This finding has not changed. Bowel gas pattern in the upper abdomen is essentially unremarkable. There is moderate amount of gas in colon. Stomach is not distended. IMPRESSION: Tip of enteric tube is seen in the fundus of the stomach pointing cephalad. There is a kink in the distal course of enteric tube. No interval changes are noted since 08/30/2022. Electronically Signed   By: Elmer Picker M.D.   On: 08/31/2022 11:55    Medications: Scheduled:   amiodarone  400 mg Per Tube BID   atorvastatin  20 mg Per Tube Daily   Chlorhexidine Gluconate Cloth  6 each Topical Daily   Chlorhexidine Gluconate Cloth  6 each Topical Daily   clopidogrel  75 mg Per Tube Daily   feeding supplement (PROSource TF20)  60 mL Per Tube Daily   insulin aspart  0-9 Units Subcutaneous Q4H   mexiletine  150 mg Per Tube Q8H   multivitamin with minerals  1 tablet Per  Tube Daily   mouth rinse  15 mL Mouth Rinse Q2H   pantoprazole  40 mg Per Tube Daily   potassium chloride  40 mEq Per Tube BID   sodium chloride flush  10-40 mL Intracatheter Q12H   thiamine (VITAMIN B1) injection  100 mg Intravenous Daily   [START ON 09/05/2022] thiamine  100 mg Per Tube Daily   Continuous:  ampicillin-sulbactam (UNASYN) IV Stopped (09/02/22 0500)   dexmedetomidine (PRECEDEX) IV infusion     feeding supplement (VITAL 1.5 CAL)     fentaNYL infusion INTRAVENOUS Stopped (09/02/22 0748)   heparin 1,250 Units/hr (09/02/22 0800)   lactated ringers 50 mL/hr at 09/02/22 0800   levETIRAcetam Stopped (09/02/22 0101)   norepinephrine (LEVOPHED) Adult infusion 8 mcg/min (09/02/22 0800)   Imaging reviewed:  CT-scan of the brain: Questionable hyperdense left MCA   CTA head and neck: negative for LVO, 70% stenosis of right ICA, severe calcific stenosis of right vertebral artery   MRI examination of the brain pending, will need to obtain tomorrow as patient has AICD     Echocardiogram 9/16 EF 35-40%, LVH and hypokinesis of anteroseptal wall, anterior wall and apical segment   LABS: A1c 6.3 LDL-c 73   Assessment: 61 year old patient with history of stage IV colon cancer, CHF with 20% EF and ACID, a-fib on Eliquis, HTN and seizures who presented with status epilepticus.  While in the ED, he required intubation, and he became pulseless requiring one round of CPR in CT.  Patient continues to require norepinephrine to maintain blood pressure.   - On today's exam, he continues to have no  seizure activity. He continues to slowly improve in terms of neurological function, following simple commands.  He is unable to cross midline to the left while tracking, suggestive of a lesion involving his right frontal eye fields.    - EEG: - LTM EEG 9/17 showed no seizure activity - LTM EEG report for Monday AM: Findings suggestive of severe diffuse encephalopathy, nonspecific etiology but could be related to sedation. No seizures or epileptiform discharges were seen throughout the recording. - LTM EEG was discontinued on Monday - He may have anoxic brain injury from cardiac arrest. However, downtime was brief (<5 minutes) and high quality CPR was provided.   - Will need MRI with and without contrast to evaluate for stroke, anoxic brain injury and possible brain mets from known colon cancer.  Given that patient initially presented with sudden onset aphasia, stroke is possible and will not be able to rule out until MRI is done.   Impression:  - Seizures  - Possible stroke and/or anoxic brain injury following cardiac arrest  - Significant comorbidities include colon cancer and CHF   Recommendations: - Keppra '1500mg'$  q12h - MRI brain with and without contrast when stable - Atorvastatin 20 mg daily  - q4 hr neuro checks - Amb referral to neurology upon discharge   - Management of cardiac issues per CCM  35 minutes spent in the neurological evaluation and management of this critically ill patient.    LOS: 3 days   '@Electronically'$  signed: Dr. Kerney Elbe 09/02/2022  8:43 AM

## 2022-09-02 NOTE — Progress Notes (Signed)
Remote ICD transmission.   

## 2022-09-02 NOTE — Progress Notes (Signed)
NAME:  Gregory [name], MRN:  [id number], DOB:  February 05, 1961, LOS: 3 ADMISSION DATE:  08/30/2022, CONSULTATION DATE:  08/30/22 REFERRING MD:  Kathrynn Humble, CHIEF COMPLAINT:  cardiac arrest   History of Present Illness:  61 yo PMH metastatic colon cancer, HFrEF, VT s/p ICD, Afib, HTN -poorly controlled, DM, CAD, seizure disorder, recent intraabdominal abscess s/p drain presented to ED 08/30/22 with AMS as code stroke. LKN 9/16 0830. There was also concern for possible seizure like activity en route to hospital and pt received 2.'5mg'$  versed for this. Witnessed seizure activity in ED, received 4400 keppra and Ativan.  CT H with L MCA hyperdensity.  Patient decompensated after CT, becoming more hypoxic and was not able to protect his airway. Intubated with etomidate + roc. Taken for CTA Head/neck, but had a PEA arrest requiring about 5 min ACLS.  CTA Head/neck without LVO. R ICA stenosis, R vertebral artery calcific stenosis  CTA chest without evidence of PE. Looks like he aspirated, has rib fx in setting of CPR, small pleural effusion   After ROSC pt has remained unstable, requiring NE. Having runs on VT in ED.   PCCM consulted for admission in this setting   Pertinent  Medical History  Metastatic colon cancer Seizure disorder HTN OSA DM2 Recurrent VT Afib CAD IAA  Chronic pancreatitis  Splenic artery pseudoaneurysm  Significant Hospital Events: Including procedures, antibiotic start and stop dates in addition to other pertinent events   9/16 to ED as Code stroke + concern for seizure. L MCA stroke. Hypoxic, req ETT. Brief PEA arrest. Shock. CCM called to admit  9/19 awake and following commands.  Still periods of apnea  Interim History / Subjective:  Follows commands periods of agitation.  Objective   Blood pressure (!) 94/53, pulse (!) 52, temperature 100 F (37.8 C), resp. rate 12, weight 97.8 kg, SpO2 100 %.    Vent Mode: PRVC FiO2 (%):  [30 %-40 %] 30 % Set Rate:  [12  bmp-16 bmp] 12 bmp Vt Set:  [700 mL] 700 mL PEEP:  [5 cmH20-8 cmH20] 8 cmH20 Pressure Support:  [10 cmH20] 10 cmH20 Plateau Pressure:  [17 cmH20-27 cmH20] 17 cmH20   Intake/Output Summary (Last 24 hours) at 09/02/2022 1250 Last data filed at 09/02/2022 1200 Gross per 24 hour  Intake 3841.67 ml  Output 930 ml  Net 2911.67 ml    Filed Weights   08/31/22 0232 09/01/22 0500 09/02/22 0500  Weight: 89.8 kg 93.3 kg 97.8 kg    Examination: General: Critically ill older adult M intubated lightly sedated  HENT: NCAT. ETT secure pink mm  Lungs: Chest clear.  Episodes of apnea on SBT. Cardiovascular: s1s2 cap refill < 3sec  Abdomen: soft ndnt hypoactive  Extremities: no acute joint deformity  Neuro: On fentanyl, will orient to voice, trying to sit up in bed, admittedly agitated. GU: Foley catheter in place.     Ancillary test personally reviewed:  Hypokalemia 2.5 Hypophosphatemia Mild leukocytosis.  CT head: nil acute  Assessment & Plan:   Critically ill due to PEA Cardiac arrest due to suspected aspiration event following seizure, with evidence of hypoxic ischemic encephalopathy.  -Appears to have made a near complete neurological recovery. -Dexmedetomidine for comfort - Formal neuroprognostication at 96h, but current progress suggests patient should may good neurological recovery.    Critically ill due to  combination of hypovolemia as well as septic shock after aspiration event and aspiration pneumonia as demonstrated by abnormal chest x-ray, new infiltrates consistent with pneumonia. -  NE for MAP > 65, will add vasopressin if not able to wean after ongoing resuscitation  Status epilepticus, cause unclear.  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   Critically ill due to Acute respiratory failure with hypoxia due to aspiration PNA -Decrease respiratory rate, stop fentanyl and repeat SBT -May may be appropriate for extubation later today. -Unasyn for aspiration pneumonia  HFrEF Recurrent VT  Afib on eliquis  CAD  Poorly controlled HTN -Continue amio, mexilitine -hold eliquis,  plavix  Metastatic colon cancer  -functional status and comorbidities (poorly controlled HTN, CHF) limit some chemo options -on Xeloda outpt   Goals of Care -decision makers are son and daughter.  On admission conveyed recommendation for DNR status, and recommended coming to hospital to discuss further.  Best Practice (right click and "Reselect all SmartList Selections" daily)   Diet/type: NPO start tube feeds DVT prophylaxis: IV heparin for atrial fibrillation.  GI prophylaxis: PPI Lines: N/A Foley:  N/A Code Status:  full code Last date of multidisciplinary goals of care discussion [daughter updated at the bedside. ]   CRITICAL CARE Performed by: Kipp Brood   Total critical care time: 40 minutes  Critical care time was exclusive of separately billable procedures and treating other patients.  Critical care was necessary to treat or prevent imminent or life-threatening deterioration.  Critical care was time spent personally by me on the following activities: development of treatment plan with patient and/or surrogate as well as nursing, discussions with consultants, evaluation of patient's response to treatment, examination of patient, obtaining history from patient or surrogate, ordering and performing treatments and interventions, ordering and review of laboratory studies, ordering and review of radiographic studies, pulse oximetry and re-evaluation of patient's condition.   Kipp Brood, MD  [place] for contact info 09/02/2022, 12:50 PM

## 2022-09-02 NOTE — Progress Notes (Signed)
Brief Nutrition Note  Pt at high risk for refeeding due to severe malnutrition after initiation of TF. Reviewed labs this AM and K low/trending down and phosphorus critically low this AM. Replacement of phosphorus ordered early this AM but had been discontinued prior to initiation. Reached out to MD, RN, and RPH to discuss low value and recommended replacement. Repeat labs to be drawn at 5PM. Adjusted rate of TF to 41m with recommendation of correcting electrolyte deficiencies prior to further advancing.  Also added IV thiamine x3 days followed by PO x 7. Will continue to monitor electrolytes and TF rate.  RRanell Patrick RD, LDN Clinical Dietitian RD pager # available in [place] After hours/weekend pager # [facility]

## 2022-09-02 NOTE — Progress Notes (Signed)
ANTICOAGULATION CONSULT NOTE - Follow Up Consult  Pharmacy Consult for heparin Indication: Afib  No Known Allergies  Patient Measurements: Weight: 97.8 kg (215 lb 9.8 oz) Heparin Dosing Weight: 89kg  Vital Signs: Temp: 100 F (37.8 C) (09/19 1145) BP: 94/53 (09/19 1145) Pulse Rate: 52 (09/19 1145)  Labs: Recent Labs    08/30/22 1427 08/30/22 1752 08/30/22 1845 08/30/22 1919 08/31/22 0049 08/31/22 0419 08/31/22 1034 09/01/22 0501 09/01/22 0827 09/01/22 1333 09/01/22 1714 09/01/22 2330 09/02/22 0405 09/02/22 0515 09/02/22 0820  HGB  --    < >  --    < >  --  14.8  --  11.9*  --   --   --   --  10.3*  --   --   HCT  --    < >  --    < >  --  43.0  --  33.7*  --   --   --   --  29.9*  --   --   PLT  --   --   --   --   --  153  --  127*  --   --   --   --  111*  --   --   APTT  --   --   --   --  101*  --    < > 54*  --  53*  --  60*  --   --  71*  HEPARINUNFRC  --   --   --   --  >1.10*  --   --  1.06*  --   --   --   --   --  1.01*  --   CREATININE 1.20  --   --    < >  --  1.15  --  0.90 1.00  --  0.90  --  0.90  --   --   TROPONINIHS 46*  --  90*  --   --   --   --   --   --   --   --   --   --   --   --    < > = values in this interval not displayed.     Estimated Creatinine Clearance: 108.6 mL/min (by C-G formula based on SCr of 0.9 mg/dL).   Medical History: Past Medical History:  Diagnosis Date   AICD (automatic cardioverter/defibrillator) present 2005   CAD (coronary artery disease) 12/01/2013   Chronic combined systolic and diastolic CHF, NYHA class 1 ([place]) 12/01/2013   Erectile dysfunction 12/01/2013   HTN (hypertension) 12/01/2013   Hyperlipidemia 12/01/2013   Ischemic cardiomyopathy 12/01/2013   Presence of permanent cardiac pacemaker    Sleep apnea     Assessment: 73 YOM presenting as code stroke, PEA arrest in CT, negative LVO on CTA.  Hx CAD, afib on Eliquis PTA  Heparin level high due to recent eliquis use.  APTT is 71 sec which is within  goal range on IV heparin at 1250 units/hr. No bleeding noted cbc stable  S/p possible cva will keep on lower end of range    Goal of Therapy:  Heparin level 0.3-0.5 Aptt 66-90  sec Monitor platelets by anticoagulation protocol: Yes   Plan:  Continue IV Heparin at 1250 units/hr. Daily APTT, heparin level and cbc   [place] Crane, Roylene Reason, BCCP Clinical Pharmacist  09/02/2022 12:58 PM   [facility] pharmacy phone numbers are listed on [email or web address]

## 2022-09-02 NOTE — Procedures (Signed)
Cortrak  Tube Type:  Cortrak - 43 inches Tube Location:  Right nare Initial Placement:  Stomach Secured by: Bridle Technique Used to Measure Tube Placement:  Marking at nare/corner of mouth Cortrak Secured At:  80 cm   Cortrak Tube Team Note:  Consult received to place a Cortrak feeding tube.   X-ray is required, abdominal x-ray has been ordered by the Cortrak team. Please confirm tube placement before using the Cortrak tube.   If the tube becomes dislodged please keep the tube and contact the Cortrak team at [email or web address] (password [email or web address]) for replacement.  If after hours and replacement cannot be delayed, place a NG tube and confirm placement with an abdominal x-ray.    Koleen Distance MS, RD, LDN Please refer to [facility] for RD and/or RD on-call/weekend/after hours pager

## 2022-09-03 ENCOUNTER — Inpatient Hospital Stay (HOSPITAL_COMMUNITY): Payer: Commercial Managed Care - HMO

## 2022-09-03 DIAGNOSIS — I469 Cardiac arrest, cause unspecified: Secondary | ICD-10-CM | POA: Diagnosis not present

## 2022-09-03 DIAGNOSIS — R569 Unspecified convulsions: Secondary | ICD-10-CM | POA: Diagnosis not present

## 2022-09-03 LAB — GLUCOSE, CAPILLARY
Glucose-Capillary: 150 mg/dL — ABNORMAL HIGH (ref 70–99)
Glucose-Capillary: 138 mg/dL — ABNORMAL HIGH (ref 70–99)
Glucose-Capillary: 130 mg/dL — ABNORMAL HIGH (ref 70–99)
Glucose-Capillary: 112 mg/dL — ABNORMAL HIGH (ref 70–99)
Glucose-Capillary: 99 mg/dL (ref 70–99)
Glucose-Capillary: 113 mg/dL — ABNORMAL HIGH (ref 70–99)

## 2022-09-03 LAB — HEPATIC FUNCTION PANEL
ALT: 335 U/L — ABNORMAL HIGH (ref 0–44)
AST: 57 U/L — ABNORMAL HIGH (ref 15–41)
Albumin: 2.5 g/dL — ABNORMAL LOW (ref 3.5–5.0)
Alkaline Phosphatase: 112 U/L (ref 38–126)
Bilirubin, Direct: 0.3 mg/dL — ABNORMAL HIGH (ref 0.0–0.2)
Indirect Bilirubin: 0.7 mg/dL (ref 0.3–0.9)
Total Bilirubin: 1 mg/dL (ref 0.3–1.2)
Total Protein: 5.9 g/dL — ABNORMAL LOW (ref 6.5–8.1)

## 2022-09-03 LAB — BASIC METABOLIC PANEL
Anion gap: 8 (ref 5–15)
BUN: 13 mg/dL (ref 8–23)
CO2: 24 mmol/L (ref 22–32)
Calcium: 8 mg/dL — ABNORMAL LOW (ref 8.9–10.3)
Chloride: 108 mmol/L (ref 98–111)
Creatinine, Ser: 0.79 mg/dL (ref 0.61–1.24)
GFR, Estimated: >60 mL/min (ref >60)
Glucose, Bld: 145 mg/dL — ABNORMAL HIGH (ref 70–99)
Potassium: 4 mmol/L (ref 3.5–5.1)
Sodium: 140 mmol/L (ref 135–145)

## 2022-09-03 LAB — HEPARIN LEVEL (UNFRACTIONATED): Heparin Unfractionated: 0.59 IU/mL (ref 0.30–0.70)

## 2022-09-03 LAB — APTT
aPTT: 55 seconds — ABNORMAL HIGH (ref 24–36)
aPTT: 50 seconds — ABNORMAL HIGH (ref 24–36)

## 2022-09-03 LAB — PHOSPHORUS
Phosphorus: 1.6 mg/dL — ABNORMAL LOW (ref 2.5–4.6)
Phosphorus: 2.8 mg/dL (ref 2.5–4.6)

## 2022-09-03 LAB — CBC
HCT: 28.8 % — ABNORMAL LOW (ref 39.0–52.0)
Hemoglobin: 9.8 g/dL — ABNORMAL LOW (ref 13.0–17.0)
MCH: 31.8 pg (ref 26.0–34.0)
MCHC: 34 g/dL (ref 30.0–36.0)
MCV: 93.5 fL (ref 80.0–100.0)
Platelets: 83 10*3/uL — ABNORMAL LOW (ref 150–400)
RBC: 3.08 MIL/uL — ABNORMAL LOW (ref 4.22–5.81)
RDW: 21.1 % — ABNORMAL HIGH (ref 11.5–15.5)
WBC: 7.6 10*3/uL (ref 4.0–10.5)
nRBC: 0 % (ref 0.0–0.2)

## 2022-09-03 MED ORDER — PANTOPRAZOLE SODIUM 40 MG PO TBEC
40.0000 mg | DELAYED_RELEASE_TABLET | Freq: Every day | ORAL | Status: DC
  Administered 2022-09-03: 40 mg via ORAL
  Filled 2022-09-03: qty 1

## 2022-09-03 MED ORDER — POLYETHYLENE GLYCOL 3350 17 G PO PACK: 17.0000 g | PACK | Freq: Every day | ORAL | Status: DC | PRN

## 2022-09-03 MED ORDER — FENTANYL CITRATE PF 50 MCG/ML IJ SOSY
50.0000 ug | PREFILLED_SYRINGE | INTRAMUSCULAR | Status: DC | PRN
  Administered 2022-09-03 – 2022-09-04 (×6): 50 ug via INTRAVENOUS
  Filled 2022-09-03 (×6): qty 1

## 2022-09-03 MED ORDER — ADULT MULTIVITAMIN W/MINERALS CH
1.0000 | ORAL_TABLET | Freq: Every day | ORAL | Status: DC
  Administered 2022-09-03 – 2022-09-11 (×9): 1 via ORAL
  Filled 2022-09-03 (×9): qty 1

## 2022-09-03 MED ORDER — LEVOTHYROXINE SODIUM 50 MCG PO TABS
50.0000 ug | ORAL_TABLET | Freq: Every day | ORAL | Status: DC
  Administered 2022-09-04 – 2022-09-11 (×8): 50 ug via ORAL
  Filled 2022-09-03 (×8): qty 1

## 2022-09-03 MED ORDER — FUROSEMIDE 10 MG/ML IJ SOLN
40.0000 mg | Freq: Once | INTRAMUSCULAR | Status: AC
  Administered 2022-09-03: 40 mg via INTRAVENOUS
  Filled 2022-09-03: qty 4

## 2022-09-03 MED ORDER — ACETAMINOPHEN 325 MG PO TABS
650.0000 mg | ORAL_TABLET | ORAL | Status: DC | PRN
  Administered 2022-09-03 – 2022-09-11 (×6): 650 mg via ORAL
  Filled 2022-09-03 (×6): qty 2

## 2022-09-03 MED ORDER — CLOPIDOGREL BISULFATE 75 MG PO TABS
75.0000 mg | ORAL_TABLET | Freq: Every day | ORAL | Status: DC
  Administered 2022-09-03: 75 mg via ORAL
  Filled 2022-09-03: qty 1

## 2022-09-03 MED ORDER — SODIUM CHLORIDE 0.9 % IV SOLN: INTRAVENOUS | Status: DC | PRN

## 2022-09-03 MED ORDER — VITAL 1.5 CAL PO LIQD
1000.0000 mL | ORAL | Status: DC
  Administered 2022-09-04: 1000 mL
  Filled 2022-09-03 (×2): qty 1000

## 2022-09-03 MED ORDER — POTASSIUM CHLORIDE CRYS ER 10 MEQ PO TBCR: 40.0000 meq | EXTENDED_RELEASE_TABLET | Freq: Once | ORAL | Status: DC

## 2022-09-03 MED ORDER — THIAMINE MONONITRATE 100 MG PO TABS
100.0000 mg | ORAL_TABLET | Freq: Every day | ORAL | Status: DC
  Administered 2022-09-05 – 2022-09-07 (×3): 100 mg via ORAL
  Filled 2022-09-03 (×3): qty 1

## 2022-09-03 MED ORDER — ORAL CARE MOUTH RINSE: 15.0000 mL | OROMUCOSAL | Status: DC | PRN

## 2022-09-03 MED ORDER — AMIODARONE HCL 200 MG PO TABS
400.0000 mg | ORAL_TABLET | Freq: Two times a day (BID) | ORAL | Status: DC
  Administered 2022-09-03 – 2022-09-04 (×2): 400 mg via ORAL
  Filled 2022-09-03 (×2): qty 2

## 2022-09-03 MED ORDER — FENTANYL CITRATE PF 50 MCG/ML IJ SOSY
25.0000 ug | PREFILLED_SYRINGE | INTRAMUSCULAR | Status: DC | PRN
  Administered 2022-09-03 (×2): 25 ug via INTRAVENOUS
  Filled 2022-09-03 (×2): qty 1

## 2022-09-03 MED ORDER — ORAL CARE MOUTH RINSE
15.0000 mL | OROMUCOSAL | Status: DC
  Administered 2022-09-03 – 2022-09-04 (×5): 15 mL via OROMUCOSAL

## 2022-09-03 MED ORDER — POTASSIUM CHLORIDE 20 MEQ PO PACK
40.0000 meq | PACK | Freq: Once | ORAL | Status: AC
  Administered 2022-09-03: 40 meq via ORAL
  Filled 2022-09-03: qty 2

## 2022-09-03 MED ORDER — DOCUSATE SODIUM 100 MG PO CAPS: 100.0000 mg | ORAL_CAPSULE | Freq: Two times a day (BID) | ORAL | Status: DC | PRN

## 2022-09-03 MED ORDER — HEPARIN (PORCINE) 25000 UT/250ML-% IV SOLN
1450.0000 [IU]/h | INTRAVENOUS | Status: DC
  Administered 2022-09-04: 1450 [IU]/h via INTRAVENOUS
  Filled 2022-09-03: qty 250

## 2022-09-03 MED ORDER — ATORVASTATIN CALCIUM 10 MG PO TABS
20.0000 mg | ORAL_TABLET | Freq: Every day | ORAL | Status: DC
  Administered 2022-09-03: 20 mg via ORAL
  Filled 2022-09-03: qty 2

## 2022-09-03 MED ORDER — MEXILETINE HCL 150 MG PO CAPS
150.0000 mg | ORAL_CAPSULE | Freq: Three times a day (TID) | ORAL | Status: DC
  Administered 2022-09-03 – 2022-09-04 (×3): 150 mg via ORAL
  Filled 2022-09-03 (×3): qty 1

## 2022-09-03 MED ORDER — LEVETIRACETAM 500 MG PO TABS
1000.0000 mg | ORAL_TABLET | Freq: Two times a day (BID) | ORAL | Status: DC
  Administered 2022-09-04 – 2022-09-05 (×4): 1000 mg via ORAL
  Filled 2022-09-03: qty 4
  Filled 2022-09-03 (×2): qty 2
  Filled 2022-09-03: qty 4

## 2022-09-03 MED ORDER — SODIUM PHOSPHATES 45 MMOLE/15ML IV SOLN
45.0000 mmol | Freq: Once | INTRAVENOUS | Status: AC
  Administered 2022-09-03: 45 mmol via INTRAVENOUS
  Filled 2022-09-03: qty 15

## 2022-09-03 NOTE — Progress Notes (Addendum)
ANTICOAGULATION CONSULT NOTE - Follow Up Consult  Pharmacy Consult for heparin Indication: Afib  No Known Allergies  Patient Measurements: Weight: 98.1 kg (216 lb 4.3 oz) Heparin Dosing Weight: 89kg  Vital Signs: Temp: 97.5 F (36.4 C) (09/20 0645) Temp Source: Bladder (09/20 0400) BP: 113/82 (09/20 0600) Pulse Rate: 50 (09/20 0645)  Labs: Recent Labs    09/01/22 0501 09/01/22 0827 09/01/22 2330 09/02/22 0405 09/02/22 0515 09/02/22 0820 09/02/22 1939 09/03/22 0428  HGB 11.9*  --   --  10.3*  --   --   --  9.8*  HCT 33.7*  --   --  29.9*  --   --   --  28.8*  PLT 127*  --   --  111*  --   --   --  83*  APTT 54*   < > 60*  --   --  71*  --  55*  HEPARINUNFRC 1.06*  --   --   --  1.01*  --   --  0.59  CREATININE 0.90   < >  --  0.90  --   --  0.77 0.79   < > = values in this interval not displayed.     Estimated Creatinine Clearance: 122.2 mL/min (by C-G formula based on SCr of 0.79 mg/dL).   Medical History: Past Medical History:  Diagnosis Date   AICD (automatic cardioverter/defibrillator) present 2005   CAD (coronary artery disease) 12/01/2013   Chronic combined systolic and diastolic CHF, NYHA class 1 ([place]) 12/01/2013   Erectile dysfunction 12/01/2013   HTN (hypertension) 12/01/2013   Hyperlipidemia 12/01/2013   Ischemic cardiomyopathy 12/01/2013   Presence of permanent cardiac pacemaker    Sleep apnea     Assessment: 42 YOM presenting as code stroke, PEA arrest in CT, negative LVO on CTA.  Hx CAD, afib on Eliquis PTA  Heparin level elevated due to recent eliquis use.  APTT is low this morning at 55 sec which is below goal range on IV heparin at 1250 units/hr. No bleeding noted. Hgb down to 9.8 but platelet count has steadily decreased to 83 from around 150s on admit. S/p possible cva will keep on lower end of range.    Goal of Therapy:  Heparin level 0.3-0.5 Aptt 66-90  sec Monitor platelets by anticoagulation protocol: Yes   Plan:  Increase IV  Heparin to 1350 units/hr. Daily APTT, heparin level and cbc  Erin Hearing PharmD., BCPS Clinical Pharmacist 09/03/2022 8:42 AM

## 2022-09-03 NOTE — Evaluation (Signed)
Physical Therapy Treatment Patient Details Name: Gregory [name] MRN: [id number] DOB: 04-07-1961 Today's Date: 09/03/2022  History of Present Illness  Pt is a 61 y.o. male admitted 08/30/22 with AMS as code stroke. Pt decompensated in ED requiring intubation, brief PEA arrest in CT requiring 1x round CPR. Initial head CT with questionable dense L MCA, but CTA revealed no LVO. EEG 9/17 with encephalopathy, no seizure activity. Chest CTA with small pleural effusion, rib fx in setting of CPR. ETT 9/16-9/20. PMH includes metastatic colon CA, HFrEF, VT s/p ICD, afib, HTN, DM, CAD, seizure disorder, recent intraabdominal abscess s/p drain.   Clinical Impression  Pt progressing with mobility, now extubated maintaining SpO2 >/98% on RA. Pt able to initiate standing trials and side steps at EOB, requiring min-maxA+2 for mobility. Though pt demonstrates good RUE/RLE strength, pt with apparent coordination deficits and R-side inattention (at times requiring physical assist to take step with RLE), as well as decreased awareness, poor attention and difficulty problem solving. Pt also with decreased activity tolerance and impaired balance strategies/postural reactions. Pt would benefit from intensive AIR-level therapies to maximize functional mobility and independence prior to return home; d/c recommendations updated.     Recommendations for follow up therapy are one component of a multi-disciplinary discharge planning process, led by the attending physician.  Recommendations may be updated based on patient status, additional functional criteria and insurance authorization.  Follow Up Recommendations Acute inpatient rehab (3hours/day)      Assistance Recommended at Discharge Frequent or constant Supervision/Assistance  Patient can return home with the following  A lot of help with walking and/or transfers;A lot of help with bathing/dressing/bathroom;Assistance with cooking/housework;Direct  supervision/assist for medications management;Direct supervision/assist for financial management;Assist for transportation;Help with stairs or ramp for entrance    Equipment Recommendations Other (TBD)  Recommendations for Other Services   Rehab Consult   Functional Status Assessment Patient has had a recent decline in their functional status and demonstrates the ability to make significant improvements in function in a reasonable and predictable amount of time.     Precautions / Restrictions Precautions Precautions: Fall;Other (comment) Precaution Comments: cortrak, L femoral central line (cleared for OOB by MD); R inattention Restrictions Weight Bearing Restrictions: No      Mobility  Bed Mobility Overal bed mobility: Needs Assistance Bed Mobility: Supine to Sit, Sit to Supine     Supine to sit: Min assist Sit to supine: Min assist   General bed mobility comments: MinA for trunk elevation; requiring significant increased time and cues for sequencing to complete task, pt frequently distracted or apparently forgetting what task to complete    Transfers Overall transfer level: Needs assistance Equipment used: 2 person hand held assist Transfers: Sit to/from Stand Sit to Stand: Mod assist, +2 physical assistance, +2 safety/equipment, From elevated surface           General transfer comment: min-modA+2 for trunk elevation and stability standing from EOB; initial stand without HHA, pt bracing BLEs against EOB and unable to progress weight forward requiring return to sit; additional standing trial with BUE support pulling on back of recliner to stabilize, modA for stability    Ambulation/Gait Ambulation/Gait assistance: Mod assist, +2 physical assistance (pre-gait/side steps at EOB)           Pre-gait activities: side steps towards EOB with BUE support holding onto back of recliner, modA+2 to maintain stability, noted bilateral knee instability; pt having significant  difficulty coordinating movement with RLE to side step, requiring multimodal cues and  external assist at time to take step; quick to fatigue requiring seated rest    Stairs            Wheelchair Mobility    Modified Rankin (Stroke Patients Only)       Balance Overall balance assessment: Needs assistance Sitting-balance support: No upper extremity supported, Feet supported Sitting balance-Leahy Scale: Fair     Standing balance support: Bilateral upper extremity supported, During functional activity Standing balance-Leahy Scale: Poor Standing balance comment: relies on BUE and external support                             Pertinent Vitals/Pain      Home Living                          Prior Function                       Hand Dominance        Extremity/Trunk Assessment   Upper Extremity Assessment Upper Extremity Assessment: RUE deficits/detail;Generalized weakness;Difficult to assess due to impaired cognition;LUE deficits/detail RUE Deficits / Details: decreased coordination R>L but question cognition limiting LUE Deficits / Details: baseline L shoulder AROM limitations    Lower Extremity Assessment Lower Extremity Assessment: RLE deficits/detail;Difficult to assess due to impaired cognition RLE Deficits / Details: MMT screen seated EOB >4/5 strength in hip, knees and ankles but pt with poor motor planning and apparent decreased coordination with RLE requiring multimodal cues for correct movement (ex. pt performing ankle pumps with LLE despite multiple cues to perform with RLE)       Communication      Cognition Arousal/Alertness: Awake/alert Behavior During Therapy: Flat affect Overall Cognitive Status: No family/caregiver present to determine baseline cognitive functioning Area of Impairment: Attention, Memory, Following commands, Safety/judgement, Awareness, Problem solving, Orientation                 Orientation  Level: Disoriented to, Time (reports Wednesday, October 2023) Current Attention Level: Focused Memory: Decreased short-term memory Following Commands: Follows one step commands inconsistently, Follows one step commands with increased time Safety/Judgement: Decreased awareness of safety, Decreased awareness of deficits Awareness: Intellectual Problem Solving: Slow processing, Difficulty sequencing, Requires verbal cues, Decreased initiation, Requires tactile cues General Comments: requires repetition and multimodal cueing to complete simple tasks with poor attention and frequent redirection. R inattention noted (ex. OT talking to patient from R side but pt conversing back with PT on L side though PT not talking). Poor awareness, reporting he could go home independently today without assist. poor coordination with R side        General Comments General comments (skin integrity, edema, etc.): SpO2 98-100% on RA, HR 83-107, post-mobility BP 143/83    Exercises     Assessment/Plan    PT Assessment Patient needs continued PT services  PT Problem List Decreased strength;Decreased mobility;Decreased safety awareness;Decreased activity tolerance;Decreased balance;Decreased knowledge of use of DME;Decreased cognition;Cardiopulmonary status limiting activity;Decreased coordination       PT Treatment Interventions DME instruction;Therapeutic exercise;Balance training;Functional mobility training;Therapeutic activities;Patient/family education;Cognitive remediation;Neuromuscular re-education;Gait training;Stair training    PT Goals (Current goals can be found in the Care Plan section)  Acute Rehab PT Goals Patient Stated Goal: return home with help from son PT Goal Formulation: With patient    Frequency Min 4X/week     Co-evaluation PT/OT/SLP Co-Evaluation/Treatment: Yes Reason for Co-Treatment: Complexity of  the patient's impairments (multi-system involvement);Necessary to address  cognition/behavior during functional activity;For patient/therapist safety;To address functional/ADL transfers PT goals addressed during session: Mobility/safety with mobility;Balance         AM-PAC PT "6 Clicks" Mobility  Outcome Measure Help needed turning from your back to your side while in a flat bed without using bedrails?: A Lot Help needed moving from lying on your back to sitting on the side of a flat bed without using bedrails?: A Lot Help needed moving to and from a bed to a chair (including a wheelchair)?: A Lot Help needed standing up from a chair using your arms (e.g., wheelchair or bedside chair)?: A Lot Help needed to walk in hospital room?: Total Help needed climbing 3-5 steps with a railing? : Total 6 Click Score: 10    End of Session Equipment Utilized During Treatment: Gait belt Activity Tolerance: Patient tolerated treatment well Patient left: in bed;with bed alarm set;with nursing/sitter in room (return to bed for fem line removal) Nurse Communication: Mobility status PT Visit Diagnosis: Other abnormalities of gait and mobility (R26.89);Difficulty in walking, not elsewhere classified (R26.2);Other symptoms and signs involving the nervous system (R29.898)    Time: 9532-0233 PT Time Calculation (min) (ACUTE ONLY): 30 min   Charges:     PT Treatments $Therapeutic Activity: 8-22 mins      Mabeline Caras, PT, DPT Acute Rehabilitation Services  Personal: [place] [facility] Office: [place] 09/03/2022, 3:17 PM

## 2022-09-03 NOTE — Progress Notes (Addendum)
Subjective: Patient now extubated and speaking to staff.   Objective: Current vital signs: BP 113/82   Pulse (!) 50   Temp (!) 97.5 F (36.4 C)   Resp 12   Wt 98.1 kg   SpO2 100%   BMI 24.99 kg/m  Vital signs in last 24 hours: Temp:  [97.5 F (36.4 C)-100 F (37.8 C)] 97.5 F (36.4 C) (09/20 0645) Pulse Rate:  [49-82] 50 (09/20 0645) Resp:  [11-26] 12 (09/20 0645) BP: (94-137)/(53-94) 113/82 (09/20 0600) SpO2:  [100 %] 100 % (09/20 0645) Arterial Line BP: (84-150)/(51-88) 97/56 (09/20 0645) FiO2 (%):  [30 %-40 %] 30 % (09/20 0300) Weight:  [98.1 kg] 98.1 kg (09/20 0457)  Intake/Output from previous day: 09/19 0701 - 09/20 0700 In: 4191.4 [I.V.:1987.1; NG/GT:1089; IV Piggyback:1115.3] Out: 8309 [Urine:1315] Intake/Output this shift: No intake/output data recorded. Nutritional status:  Diet Order             Diet NPO time specified  Diet effective now                   HEENT: [place]/AT Lungs: Respirations unlabored   Neurologic Exam:  Ment: Awake and alert. Speech is fluent with intact comprehension and naming.  CN: PERRL. EOMI - able to gaze to left and right without difficulty, improved since yesterday. No nystagmus. Face is symmetric. Phonation is hypophonic post-extubation.  Motor: 4/5 BUE proximally with 5/5 grips bilaterally.  BLE 4/5 bilaterally.  Sensory: Reacts to touch x 4 Reflexes: 2+ bilateral brachioradialis and patellae Cerebellar: No ataxia noted.  Gait: Unable to assess.    Lab Results: Results for orders placed or performed during the hospital encounter of 08/30/22 (from the past 48 hour(s))  Basic metabolic panel     Status: Abnormal   Collection Time: 09/01/22  8:27 AM  Result Value Ref Range   Sodium 138 135 - 145 mmol/L   Potassium 3.1 (L) 3.5 - 5.1 mmol/L   Chloride 102 98 - 111 mmol/L   CO2 24 22 - 32 mmol/L   Glucose, Bld 124 (H) 70 - 99 mg/dL    Comment: Glucose reference range applies only to samples taken after fasting for at  least 8 hours.   BUN 17 8 - 23 mg/dL   Creatinine, Ser 1.00 0.61 - 1.24 mg/dL   Calcium 8.1 (L) 8.9 - 10.3 mg/dL   GFR, Estimated >60 >60 mL/min    Comment: (NOTE) Calculated using the CKD-EPI Creatinine Equation (2021)    Anion gap 12 5 - 15    Comment: Performed at [facility] [address] [place], [place] [address]  Magnesium     Status: None   Collection Time: 09/01/22  8:27 AM  Result Value Ref Range   Magnesium 2.0 1.7 - 2.4 mg/dL    Comment: Performed at [facility], [place] [address], [place], [place] [address]  Phosphorus     Status: Abnormal   Collection Time: 09/01/22  8:27 AM  Result Value Ref Range   Phosphorus 2.2 (L) 2.5 - 4.6 mg/dL    Comment: Performed at [facility] [address], [place], [place] [address]  Glucose, capillary     Status: Abnormal   Collection Time: 09/01/22 12:43 PM  Result Value Ref Range   Glucose-Capillary 125 (H) 70 - 99 mg/dL    Comment: Glucose reference range applies only to samples taken after fasting for at least 8 hours.  APTT     Status:  Abnormal   Collection Time: 09/01/22  1:33 PM  Result Value Ref Range   aPTT 53 (H) 24 - 36 seconds    Comment:        IF BASELINE aPTT IS ELEVATED, SUGGEST PATIENT RISK ASSESSMENT BE USED TO DETERMINE APPROPRIATE ANTICOAGULANT THERAPY. Performed at [facility], [place] [address], [place], [place] [address]   Glucose, capillary     Status: Abnormal   Collection Time: 09/01/22  4:06 PM  Result Value Ref Range   Glucose-Capillary 135 (H) 70 - 99 mg/dL    Comment: Glucose reference range applies only to samples taken after fasting for at least 8 hours.  Magnesium     Status: None   Collection Time: 09/01/22  5:14 PM  Result Value Ref Range   Magnesium 2.0 1.7 - 2.4 mg/dL    Comment: Performed at [facility], [place] [address], [place], [place] [address]  Phosphorus     Status: Abnormal   Collection Time: 09/01/22  5:14 PM  Result Value Ref Range   Phosphorus  1.6 (L) 2.5 - 4.6 mg/dL    Comment: Performed at [facility] [address] [place], [place] [address]  Basic metabolic panel     Status: Abnormal   Collection Time: 09/01/22  5:14 PM  Result Value Ref Range   Sodium 135 135 - 145 mmol/L   Potassium 3.8 3.5 - 5.1 mmol/L   Chloride 105 98 - 111 mmol/L   CO2 22 22 - 32 mmol/L   Glucose, Bld 143 (H) 70 - 99 mg/dL    Comment: Glucose reference range applies only to samples taken after fasting for at least 8 hours.   BUN 19 8 - 23 mg/dL   Creatinine, Ser 0.90 0.61 - 1.24 mg/dL   Calcium 7.7 (L) 8.9 - 10.3 mg/dL   GFR, Estimated >60 >60 mL/min    Comment: (NOTE) Calculated using the CKD-EPI Creatinine Equation (2021)    Anion gap 8 5 - 15    Comment: Performed at [facility] [address] [place], [place] [address]  Glucose, capillary     Status: Abnormal   Collection Time: 09/01/22  7:52 PM  Result Value Ref Range   Glucose-Capillary 150 (H) 70 - 99 mg/dL    Comment: Glucose reference range applies only to samples taken after fasting for at least 8 hours.  APTT     Status: Abnormal   Collection Time: 09/01/22 11:30 PM  Result Value Ref Range   aPTT 60 (H) 24 - 36 seconds    Comment:        IF BASELINE aPTT IS ELEVATED, SUGGEST PATIENT RISK ASSESSMENT BE USED TO DETERMINE APPROPRIATE ANTICOAGULANT THERAPY. Performed at [facility], [place] [address] [place],  [address]   Glucose, capillary     Status: Abnormal   Collection Time: 09/02/22 12:07 AM  Result Value Ref Range   Glucose-Capillary 190 (H) 70 - 99 mg/dL    Comment: Glucose reference range applies only to samples taken after fasting for at least 8 hours.  Glucose, capillary     Status: Abnormal   Collection Time: 09/02/22 12:40 AM  Result Value Ref Range   Glucose-Capillary 191 (H) 70 - 99 mg/dL    Comment: Glucose reference range applies only to samples taken after fasting for at least 8 hours.  Glucose, capillary     Status: Abnormal    Collection Time: 09/02/22  2:18 AM  Result Value Ref Range   Glucose-Capillary 165 (H) 70 - 99 mg/dL    Comment: Glucose reference range applies only to samples taken after fasting for at least 8 hours.  CBC     Status: Abnormal   Collection Time: 09/02/22  4:05 AM  Result Value Ref Range   WBC 9.9 4.0 - 10.5 K/uL   RBC 3.25 (L) 4.22 - 5.81 MIL/uL   Hemoglobin 10.3 (L) 13.0 - 17.0 g/dL   HCT 29.9 (L) 39.0 - 52.0 %   MCV 92.0 80.0 - 100.0 fL   MCH 31.7 26.0 - 34.0 pg   MCHC 34.4 30.0 - 36.0 g/dL   RDW 20.3 (H) 11.5 - 15.5 %   Platelets 111 (L) 150 - 400 K/uL   nRBC 0.0 0.0 - 0.2 %    Comment: Performed at [facility] [address] [place], [place] [address]  Basic metabolic panel     Status: Abnormal   Collection Time: 09/02/22  4:05 AM  Result Value Ref Range   Sodium 137 135 - 145 mmol/L   Potassium 3.1 (L) 3.5 - 5.1 mmol/L   Chloride 106 98 - 111 mmol/L   CO2 25 22 - 32 mmol/L   Glucose, Bld 177 (H) 70 - 99 mg/dL    Comment: Glucose reference range applies only to samples taken after fasting for at least 8 hours.   BUN 16 8 - 23 mg/dL   Creatinine, Ser 0.90 0.61 - 1.24 mg/dL   Calcium 7.7 (L) 8.9 - 10.3 mg/dL   GFR, Estimated >60 >60 mL/min    Comment: (NOTE) Calculated using the CKD-EPI Creatinine Equation (2021)    Anion gap 6 5 - 15    Comment: Performed at [facility] [address] [place], [place] [address]  TSH     Status: Abnormal   Collection Time: 09/02/22  4:05 AM  Result Value Ref Range   TSH 43.170 (H) 0.350 - 4.500 uIU/mL    Comment: Performed by a 3rd Generation assay with a functional sensitivity of <=0.01 uIU/mL. Performed at [facility], [place] [address], [place], [place] [address]   Magnesium     Status: None   Collection Time: 09/02/22  4:05 AM  Result Value Ref Range   Magnesium 2.0 1.7 - 2.4 mg/dL    Comment: Performed at [facility] [address], [place], [place] [address]  Phosphorus     Status: Abnormal    Collection Time: 09/02/22  4:05 AM  Result Value Ref Range   Phosphorus <1.0 (LL) 2.5 - 4.6 mg/dL    Comment: CRITICAL RESULT CALLED TO, READ BACK BY AND VERIFIED WITH: CHILTON,H RN @ 0768 09/02/22 LEONARD,A Performed at [facility], [place] [address], [place], [place] [address]   Glucose, capillary     Status: Abnormal   Collection Time: 09/02/22  4:16 AM  Result Value Ref Range   Glucose-Capillary 162 (H) 70 - 99 mg/dL    Comment: Glucose reference range applies only to samples taken after fasting for at least 8 hours.   Comment 1 QC Due   Heparin level (unfractionated)     Status: Abnormal   Collection Time: 09/02/22  5:15 AM  Result Value Ref Range   Heparin Unfractionated 1.01 (H) 0.30 - 0.70 IU/mL    Comment: (NOTE) The clinical reportable range upper limit is being lowered to >1.10 to align with the FDA approved guidance for the current laboratory  assay.  If heparin results are below expected values, and patient dosage has  been confirmed, suggest follow up testing of antithrombin III levels. Performed at [facility], [place] [address] [place], [place] [address]   Glucose, capillary     Status: Abnormal   Collection Time: 09/02/22  7:55 AM  Result Value Ref Range   Glucose-Capillary 142 (H) 70 - 99 mg/dL    Comment: Glucose reference range applies only to samples taken after fasting for at least 8 hours.  APTT     Status: Abnormal   Collection Time: 09/02/22  8:20 AM  Result Value Ref Range   aPTT 71 (H) 24 - 36 seconds    Comment:        IF BASELINE aPTT IS ELEVATED, SUGGEST PATIENT RISK ASSESSMENT BE USED TO DETERMINE APPROPRIATE ANTICOAGULANT THERAPY. Performed at [facility], [place] [name] [place], [place] [address]   Glucose, capillary     Status: Abnormal   Collection Time: 09/02/22 11:58 AM  Result Value Ref Range   Glucose-Capillary 110 (H) 70 - 99 mg/dL    Comment: Glucose reference range applies only to samples taken after fasting for  at least 8 hours.  T4, free     Status: Abnormal   Collection Time: 09/02/22  1:32 PM  Result Value Ref Range   Free T4 0.48 (L) 0.61 - 1.12 ng/dL    Comment: (NOTE) Biotin ingestion may interfere with free T4 tests. If the results are inconsistent with the TSH level, previous test results, or the clinical presentation, then consider biotin interference. If needed, order repeat testing after stopping biotin. Performed at [facility], [place] [address] [place], [place] [address]   Glucose, capillary     Status: Abnormal   Collection Time: 09/02/22  3:46 PM  Result Value Ref Range   Glucose-Capillary 148 (H) 70 - 99 mg/dL    Comment: Glucose reference range applies only to samples taken after fasting for at least 8 hours.  Magnesium     Status: None   Collection Time: 09/02/22  7:39 PM  Result Value Ref Range   Magnesium 2.1 1.7 - 2.4 mg/dL    Comment: Performed at [facility], [place] [address], [place], [place] [address]  Phosphorus     Status: None   Collection Time: 09/02/22  7:39 PM  Result Value Ref Range   Phosphorus 2.8 2.5 - 4.6 mg/dL    Comment: Performed at [facility] [address] [place],  [address]  Basic metabolic panel     Status: Abnormal   Collection Time: 09/02/22  7:39 PM  Result Value Ref Range   Sodium 139 135 - 145 mmol/L   Potassium 3.9 3.5 - 5.1 mmol/L   Chloride 110 98 - 111 mmol/L   CO2 24 22 - 32 mmol/L   Glucose, Bld 170 (H) 70 - 99 mg/dL    Comment: Glucose reference range applies only to samples taken after fasting for at least 8 hours.   BUN 13 8 - 23 mg/dL   Creatinine, Ser 0.77 0.61 - 1.24 mg/dL   Calcium 7.4 (L) 8.9 - 10.3 mg/dL   GFR, Estimated >60 >60 mL/min    Comment: (NOTE) Calculated using the CKD-EPI Creatinine Equation (2021)    Anion gap 5 5 - 15    Comment: Performed at [facility] [address], [place], [place] [address]  Glucose, capillary  Status: Abnormal   Collection Time: 09/02/22   7:49 PM  Result Value Ref Range   Glucose-Capillary 168 (H) 70 - 99 mg/dL    Comment: Glucose reference range applies only to samples taken after fasting for at least 8 hours.  Glucose, capillary     Status: Abnormal   Collection Time: 09/02/22 11:05 PM  Result Value Ref Range   Glucose-Capillary 132 (H) 70 - 99 mg/dL    Comment: Glucose reference range applies only to samples taken after fasting for at least 8 hours.  Glucose, capillary     Status: Abnormal   Collection Time: 09/02/22 11:45 PM  Result Value Ref Range   Glucose-Capillary 153 (H) 70 - 99 mg/dL    Comment: Glucose reference range applies only to samples taken after fasting for at least 8 hours.  Heparin level (unfractionated)     Status: None   Collection Time: 09/03/22  4:28 AM  Result Value Ref Range   Heparin Unfractionated 0.59 0.30 - 0.70 IU/mL    Comment: (NOTE) The clinical reportable range upper limit is being lowered to >1.10 to align with the FDA approved guidance for the current laboratory assay.  If heparin results are below expected values, and patient dosage has  been confirmed, suggest follow up testing of antithrombin III levels. Performed at [facility], [place] [address], [place], [place] [address]   CBC     Status: Abnormal   Collection Time: 09/03/22  4:28 AM  Result Value Ref Range   WBC 7.6 4.0 - 10.5 K/uL   RBC 3.08 (L) 4.22 - 5.81 MIL/uL   Hemoglobin 9.8 (L) 13.0 - 17.0 g/dL   HCT 28.8 (L) 39.0 - 52.0 %   MCV 93.5 80.0 - 100.0 fL   MCH 31.8 26.0 - 34.0 pg   MCHC 34.0 30.0 - 36.0 g/dL   RDW 21.1 (H) 11.5 - 15.5 %   Platelets 83 (L) 150 - 400 K/uL    Comment: Immature Platelet Fraction may be clinically indicated, consider ordering this additional test [id number] REPEATED TO VERIFY    nRBC 0.0 0.0 - 0.2 %    Comment: Performed at [facility], [place] [address] [place], [place] [address]  Basic metabolic panel     Status: Abnormal   Collection Time: 09/03/22  4:28 AM  Result  Value Ref Range   Sodium 140 135 - 145 mmol/L   Potassium 4.0 3.5 - 5.1 mmol/L   Chloride 108 98 - 111 mmol/L   CO2 24 22 - 32 mmol/L   Glucose, Bld 145 (H) 70 - 99 mg/dL    Comment: Glucose reference range applies only to samples taken after fasting for at least 8 hours.   BUN 13 8 - 23 mg/dL   Creatinine, Ser 0.79 0.61 - 1.24 mg/dL   Calcium 8.0 (L) 8.9 - 10.3 mg/dL   GFR, Estimated >60 >60 mL/min    Comment: (NOTE) Calculated using the CKD-EPI Creatinine Equation (2021)    Anion gap 8 5 - 15    Comment: Performed at [facility] [address], [place], [place] [address]  APTT     Status: Abnormal   Collection Time: 09/03/22  4:28 AM  Result Value Ref Range   aPTT 55 (H) 24 - 36 seconds    Comment:        IF BASELINE aPTT IS ELEVATED, SUGGEST PATIENT RISK ASSESSMENT BE USED TO DETERMINE APPROPRIATE ANTICOAGULANT THERAPY. Performed at [facility], [place] [address], [place], [place] [address]   Phosphorus     Status: Abnormal   Collection Time: 09/03/22  4:28 AM  Result Value Ref Range   Phosphorus 1.6 (L) 2.5 - 4.6 mg/dL    Comment: Performed at [facility] [address], [place], [place] [address]  Glucose, capillary     Status: Abnormal   Collection Time: 09/03/22  4:31 AM  Result Value Ref Range   Glucose-Capillary 150 (H) 70 - 99 mg/dL    Comment: Glucose reference range applies only to samples taken after fasting for at least 8 hours.    Recent Results (from the past 240 hour(s))  Culture, blood (Routine X 2) w Reflex to ID Panel     Status: None (Preliminary result)   Collection Time: 08/30/22  6:41 PM   Specimen: BLOOD RIGHT HAND  Result Value Ref Range Status   Specimen Description BLOOD RIGHT HAND  Final   Special Requests IN PEDIATRIC BOTTLE Blood Culture adequate volume  Final   Culture   Final    NO GROWTH 3 DAYS Performed at [facility], [address] [place], [place] [address]    Report Status PENDING  Incomplete   Culture, blood (Routine X 2) w Reflex to ID Panel     Status: Abnormal   Collection Time: 08/30/22  6:41 PM   Specimen: BLOOD RIGHT HAND  Result Value Ref Range Status   Specimen Description BLOOD RIGHT HAND  Final   Special Requests IN PEDIATRIC BOTTLE Blood Culture adequate volume  Final   Culture  Setup Time   Final    GRAM POSITIVE COCCI IN CLUSTERS IN PEDIATRIC BOTTLE CRITICAL RESULT CALLED TO, READ BACK BY AND VERIFIED WITH: K PIERCE,[email or web address]  08/31/22 [place]    Culture (A)  Final    STAPHYLOCOCCUS CAPITIS THE SIGNIFICANCE OF ISOLATING THIS ORGANISM FROM A SINGLE SET OF BLOOD CULTURES WHEN MULTIPLE SETS ARE DRAWN IS UNCERTAIN. PLEASE NOTIFY THE MICROBIOLOGY DEPARTMENT WITHIN ONE WEEK IF SPECIATION AND SENSITIVITIES ARE REQUIRED. Performed at [facility], [place] [address], [place], [place] [address]    Report Status 09/02/2022 FINAL  Final  Blood Culture ID Panel (Reflexed)     Status: Abnormal   Collection Time: 08/30/22  6:41 PM  Result Value Ref Range Status   Enterococcus faecalis NOT DETECTED NOT DETECTED Final   Enterococcus Faecium NOT DETECTED NOT DETECTED Final   Listeria monocytogenes NOT DETECTED NOT DETECTED Final   Staphylococcus species DETECTED (A) NOT DETECTED Final    Comment: CRITICAL RESULT CALLED TO, READ BACK BY AND VERIFIED WITH: K PIERCE,[email or web address]  08/31/22 [place]    Staphylococcus aureus (BCID) NOT DETECTED NOT DETECTED Final   Staphylococcus epidermidis NOT DETECTED NOT DETECTED Final   Staphylococcus lugdunensis NOT DETECTED NOT DETECTED Final   Streptococcus species NOT DETECTED NOT DETECTED Final   Streptococcus agalactiae NOT DETECTED NOT DETECTED Final   Streptococcus pneumoniae NOT DETECTED NOT DETECTED Final   Streptococcus pyogenes NOT DETECTED NOT DETECTED Final   A.calcoaceticus-baumannii NOT DETECTED NOT DETECTED Final   Bacteroides fragilis NOT DETECTED NOT DETECTED Final   Enterobacterales NOT DETECTED NOT DETECTED Final   Enterobacter  cloacae complex NOT DETECTED NOT DETECTED Final   Escherichia coli NOT DETECTED NOT DETECTED Final   Klebsiella aerogenes NOT DETECTED NOT DETECTED Final   Klebsiella oxytoca NOT DETECTED NOT DETECTED Final   Klebsiella pneumoniae NOT DETECTED NOT DETECTED Final   Proteus species NOT DETECTED NOT DETECTED Final  Salmonella species NOT DETECTED NOT DETECTED Final   Serratia marcescens NOT DETECTED NOT DETECTED Final   Haemophilus influenzae NOT DETECTED NOT DETECTED Final   Neisseria meningitidis NOT DETECTED NOT DETECTED Final   Pseudomonas aeruginosa NOT DETECTED NOT DETECTED Final   Stenotrophomonas maltophilia NOT DETECTED NOT DETECTED Final   Candida albicans NOT DETECTED NOT DETECTED Final   Candida auris NOT DETECTED NOT DETECTED Final   Candida glabrata NOT DETECTED NOT DETECTED Final   Candida krusei NOT DETECTED NOT DETECTED Final   Candida parapsilosis NOT DETECTED NOT DETECTED Final   Candida tropicalis NOT DETECTED NOT DETECTED Final   Cryptococcus neoformans/gattii NOT DETECTED NOT DETECTED Final    Comment: Performed at [facility], [place] [address] [place], [place] [address]  MRSA Next Gen by PCR, Nasal     Status: None   Collection Time: 08/30/22  6:45 PM   Specimen: Nasal Mucosa; Nasal Swab  Result Value Ref Range Status   MRSA by PCR Next Gen NOT DETECTED NOT DETECTED Final    Comment: (NOTE) The GeneXpert MRSA Assay (FDA approved for NASAL specimens only), is one component of a comprehensive MRSA colonization surveillance program. It is not intended to diagnose MRSA infection nor to guide or monitor treatment for MRSA infections. Test performance is not FDA approved in patients less than 70 years old. Performed at [facility], [place] [address], [place], [place] [address]   Culture, Respiratory w Gram Stain     Status: None   Collection Time: 08/31/22 12:49 AM   Specimen: Tracheal Aspirate; Respiratory  Result Value Ref Range Status   Specimen  Description TRACHEAL ASPIRATE  Final   Special Requests NONE  Final   Gram Stain   Final    MODERATE WBC PRESENT,BOTH PMN AND MONONUCLEAR MODERATE GRAM POSITIVE COCCI IN PAIRS AND CHAINS    Culture   Final    Normal respiratory flora-no Staph aureus or Pseudomonas seen Performed at [facility], [address] [place], [place] [address]    Report Status 09/02/2022 FINAL  Final    Lipid Panel No results for input(s): "CHOL", "TRIG", "HDL", "CHOLHDL", "VLDL", "LDLCALC" in the last 72 hours.  Studies/Results: DG Abd Portable 1V  Result Date: 09/02/2022 CLINICAL DATA:  Feeding tube placement. EXAM: PORTABLE ABDOMEN - 1 VIEW COMPARISON:  Radiographs 09/02/2022 and 08/31/2022.  CT 07/16/2022. FINDINGS: 1445 hours. Tip of the feeding tube projects over the mid lumbar spine, consistent with position in the distal stomach. Left upper quadrant embolization coils and cardiac defibrillator leads are noted. The visualized bowel gas pattern is nonobstructive. IMPRESSION: Tip of the feeding tube projects over the mid abdomen, consistent with position in the distal stomach. Electronically Signed   By: Richardean Sale M.D.   On: 09/02/2022 14:54   DG Abd Portable 1V  Result Date: 09/02/2022 CLINICAL DATA:  Check gastric catheter placed EXAM: PORTABLE ABDOMEN - 1 VIEW COMPARISON:  08/31/2022 FINDINGS: Gastric catheter is noted extending into the third portion of the duodenum. Scattered large and small bowel gas is noted. No free air is noted. IMPRESSION: Gastric catheter deep in the duodenum Electronically Signed   By: Inez Catalina M.D.   On: 09/02/2022 02:33    Medications: Scheduled:  amiodarone  400 mg Per Tube BID   atorvastatin  20 mg Per Tube Daily   Chlorhexidine Gluconate Cloth  6 each Topical Daily   Chlorhexidine Gluconate Cloth  6 each Topical Daily   clopidogrel  75  mg Per Tube Daily   feeding supplement (PROSource TF20)  60 mL Per Tube Daily   insulin aspart  0-9 Units Subcutaneous  Q4H   levothyroxine  50 mcg Per Tube Q0600   mexiletine  150 mg Per Tube Q8H   multivitamin with minerals  1 tablet Per Tube Daily   mouth rinse  15 mL Mouth Rinse Q2H   pantoprazole  40 mg Per Tube Daily   potassium chloride  40 mEq Per Tube BID   sodium chloride flush  10-40 mL Intracatheter Q12H   thiamine (VITAMIN B1) injection  100 mg Intravenous Daily   [START ON 09/05/2022] thiamine  100 mg Per Tube Daily   Continuous:  ampicillin-sulbactam (UNASYN) IV Stopped (09/03/22 0508)   dexmedetomidine (PRECEDEX) IV infusion 0.5 mcg/kg/hr (09/03/22 0658)   feeding supplement (VITAL 1.5 CAL) 30 mL/hr at 09/03/22 0658   fentaNYL infusion INTRAVENOUS 50 mcg/hr (09/03/22 0658)   heparin 1,250 Units/hr (09/03/22 7654)   lactated ringers 50 mL/hr at 09/03/22 0658   levETIRAcetam Stopped (09/03/22 0015)   norepinephrine (LEVOPHED) Adult infusion Stopped (09/03/22 0323)   sodium phosphate 45 mmol in dextrose 5 % 250 mL infusion       Assessment: 61 year old patient with history of stage IV colon cancer, CHF with 20% EF and ACID, a-fib on Eliquis, HTN and seizures who presented with status epilepticus.  While in the ED, he required intubation, and he became pulseless requiring one round of CPR in CT.  Patient continues to require norepinephrine to maintain blood pressure.   - On today's exam post-extubation the patient is with intact speech, linear thought process, not agitated and following all commands, answering questions normally. The rightward gaze preference seen yesterday is now resolved. Moving all 4 extremities without asymmetry. Overall exam findings today are not consistent with anoxic brain injury, which previously was on the DDx - EEG: - LTM EEG 9/17 showed no seizure activity - LTM EEG report for Monday AM: Findings suggestive of severe diffuse encephalopathy, nonspecific etiology but could be related to sedation. No seizures or epileptiform discharges were seen throughout the  recording. 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 LDL-c 73 - Will need MRI with and without contrast to evaluate for possible small stroke given that patient initially presented with sudden onset aphasia, stroke is possible and will not be able to rule out until MRI is done.     Recommendations: - Continue Keppra 1500 mg q12h - Can hold off on MRI for now. Can obtain CT head with contrast if a brain met work up is needed, but based on exam no brain metastasis is suspected - If MRI is needed at a later time, the model number of the patient's pacemaker is [id number] - Amb referral to Neurology upon discharge for his known seizure disorder.    - Management of cardiac issues per CCM - Neurohospitalist service will sign off. Please call us if there are additional questions.   35 minutes spent in the neurological evaluation and management of this critically ill patient.    LOS: 4 days   _0  signed: Dr. Kerney Elbe 09/03/2022  7:33 AM

## 2022-09-03 NOTE — Progress Notes (Signed)
[facility] ADULT ICU REPLACEMENT PROTOCOL   The patient does apply for the [facility] Adult ICU Electrolyte Replacment Protocol based on the criteria listed below:   1.Exclusion criteria: TCTS patients, ECMO patients, and Dialysis patients 2. Is GFR >/= 30 ml/min? Yes.    Patient's GFR today is >60 3. Is SCr </= 2? Yes.   Patient's SCr is 0.79 mg/dL 4. Did SCr increase >/= 0.5 in 24 hours? No. 5.Pt's weight >40kg  Yes.   6. Abnormal electrolyte(s): phos 1.6  7. Electrolytes replaced per protocol 8.  Call MD STAT for K+ </= 2.5, Phos </= 1, or Mag </= 1 Physician:  n/a  Darlys Gales 09/03/2022 6:19 AM

## 2022-09-03 NOTE — Procedures (Signed)
Extubation Procedure Note  Patient Details:   Name: Gregory [name] DOB: [birth year] MRN: [id number]   Airway Documentation:    Vent end date: 09/03/22 Vent end time: 0849   Evaluation  O2 sats: stable throughout Complications: No apparent complications Patient did tolerate procedure well. Bilateral Breath Sounds: Diminished   Extubated per MD order & placed on 4L [place]. Patient is able to speak & cough post extubation. Patient had positive cuff leak prior to extubation.  Kathie Dike 09/03/2022, 8:52 AM

## 2022-09-03 NOTE — Progress Notes (Signed)
NAME:  Gregory [name], MRN:  [id number], DOB:  [birth year], LOS: 4 ADMISSION DATE:  08/30/2022, CONSULTATION DATE:  08/30/22 REFERRING MD:  Kathrynn Humble, CHIEF COMPLAINT:  cardiac arrest   History of Present Illness:  61 yo PMH metastatic colon cancer, HFrEF, VT s/p ICD, Afib, HTN -poorly controlled, DM, CAD, seizure disorder, recent intraabdominal abscess s/p drain presented to ED 08/30/22 with AMS as code stroke. LKN 9/16 0830. There was also concern for possible seizure like activity en route to hospital and pt received 2.'5mg'$  versed for this. Witnessed seizure activity in ED, received 4400 keppra and Ativan.  CT H with L MCA hyperdensity.  Patient decompensated after CT, becoming more hypoxic and was not able to protect his airway. Intubated with etomidate + roc. Taken for CTA Head/neck, but had a PEA arrest requiring about 5 min ACLS.  CTA Head/neck without LVO. R ICA stenosis, R vertebral artery calcific stenosis  CTA chest without evidence of PE. Looks like he aspirated, has rib fx in setting of CPR, small pleural effusion   After ROSC pt has remained unstable, requiring NE. Having runs on VT in ED.   PCCM consulted for admission in this setting   Pertinent  Medical History  Metastatic colon cancer Seizure disorder HTN OSA DM2 Recurrent VT Afib CAD IAA  Chronic pancreatitis  Splenic artery pseudoaneurysm  Significant Hospital Events: Including procedures, antibiotic start and stop dates in addition to other pertinent events   9/16 to ED as Code stroke + concern for seizure. L MCA stroke. Hypoxic, req ETT. Brief PEA arrest. Shock. CCM called to admit  9/19 awake and following commands.  Still periods of apnea - switched to Precedex   Interim History / Subjective:  Follows commands this morning. Tolerating SBT.   Objective   Blood pressure 97/70, pulse 71, temperature 98.2 F (36.8 C), resp. rate 20, weight 98.1 kg, SpO2 100 %.    Vent Mode: CPAP;PSV FiO2 (%):  [30  %-40 %] 40 % Set Rate:  [12 bmp] 12 bmp Vt Set:  [700 mL] 700 mL PEEP:  [5 cmH20-8 cmH20] 5 cmH20 Pressure Support:  [8 cmH20] 8 cmH20 Plateau Pressure:  [13 cmH20-17 cmH20] 17 cmH20   Intake/Output Summary (Last 24 hours) at 09/03/2022 0857 Last data filed at 09/03/2022 0658 Gross per 24 hour  Intake 4048.58 ml  Output 1315 ml  Net 2733.58 ml    Filed Weights   09/01/22 0500 09/02/22 0500 09/03/22 0457  Weight: 93.3 kg 97.8 kg 98.1 kg    Examination: General: Critically ill older adult M intubated lightly sedated  HENT: NCAT. ETT secure pink mm  Lungs: Chest clear.  Episodes of apnea on SBT. Cardiovascular: s1s2 cap refill < 3sec  Abdomen: soft ndnt hypoactive  Extremities: no acute joint deformity  Neuro: Follows commands and mouthing words. Moves all limbs GU: Foley catheter in place.   Ancillary test personally reviewed:    Assessment & Plan:   Critically ill due to PEA Cardiac arrest due to suspected aspiration event following seizure, with evidence of hypoxic ischemic encephalopathy.  -Appears to have made a near complete neurological recovery. -Extubation today.   Critically ill due to  combination of hypovolemia as well as septic shock after aspiration event and aspiration pneumonia as demonstrated by abnormal chest x-ray, new infiltrates consistent with pneumonia. - Resume home diuretic as 10 L positive.   Status epilepticus, known idiopathic epilepsy -On 500 keppra BID at home, increased to 1g Keppra q12hr  Critically ill due  to acute respiratory failure with hypoxia due to aspiration PNA -Decrease respiratory rate, stop fentanyl and repeat SBT -May may be appropriate for extubation later today. -Unasyn for aspiration pneumonia  HFrEF Recurrent VT  Afib on eliquis  CAD  Poorly controlled HTN -Continue amio, mexilitine -hold Eliquis, Plavix  Metastatic colon cancer  -functional status and comorbidities (poorly controlled HTN, CHF) limit some chemo  options -on Xeloda outpt   Goals of Care -decision makers are son and daughter.  On admission conveyed recommendation for DNR status, and recommended coming to hospital to discuss further.  Best Practice (right click and "Reselect all SmartList Selections" daily)   Diet/type: Swallow evaluation following extubation.  DVT prophylaxis: IV heparin for atrial fibrillation.  GI prophylaxis: PPI Lines: Remove today Foley:  Remove today Code Status: DNR if re-arrests. Will need to revisit. Last date of multidisciplinary goals of care discussion [daughter updated at the bedside. ]   CRITICAL CARE Performed by: Kipp Brood   Total critical care time: 40 minutes  Critical care time was exclusive of separately billable procedures and treating other patients.  Critical care was necessary to treat or prevent imminent or life-threatening deterioration.  Critical care was time spent personally by me on the following activities: development of treatment plan with patient and/or surrogate as well as nursing, discussions with consultants, evaluation of patient's response to treatment, examination of patient, obtaining history from patient or surrogate, ordering and performing treatments and interventions, ordering and review of laboratory studies, ordering and review of radiographic studies, pulse oximetry and re-evaluation of patient's condition.   Kipp Brood, MD  [place] for contact info 09/03/2022, 8:57 AM

## 2022-09-03 NOTE — Progress Notes (Signed)
SLP Cancellation Note  Patient Details Name: Gregory [name] MRN: [id number] DOB: [birth year]   Cancelled treatment:        Pt on vent. Will follow.   Houston Siren 09/03/2022, 7:50 AM

## 2022-09-03 NOTE — Progress Notes (Signed)
Brief Nutrition Follow-up:   Pt extubated this AM, tolerating thus far.   NPO Cortrak remains in place, Vital 1.5 at 30 ml/hr. Tolerating  Electrolytes reviewed, phosphorus 1.6 but receiving IV supplementation. Potassium wdl. Mag not rechecked.   Noted plan for US abdomen  Interventions: -Begin slow titration of TF to goal. Recommend increasing to 40 ml/hr today, titrate by 10 ml q 12 hours until goal rate of 70 ml/hr -Given high nutritional risk and malnutrition, recommend continuing TF via Cortrak until diet advanced and pt demonstrating adequate oral intake. Once TF at goal, can consider switching to nocturnal TF -Continue aggressive repletion of electrolytes   Kerman Passey MS, RDN, LDN, CNSC Registered Dietitian 3 Clinical Nutrition RD Pager and On-Call Pager Number Located in [place]

## 2022-09-03 NOTE — Progress Notes (Signed)
ANTICOAGULATION CONSULT NOTE - Follow Up Consult  Pharmacy Consult for heparin Indication: Afib  No Known Allergies  Patient Measurements: Weight: 98.1 kg (216 lb 4.3 oz) Heparin Dosing Weight: 89kg  Vital Signs: Temp: 99.5 F (37.5 C) (09/20 1800) BP: 138/78 (09/20 1800) Pulse Rate: 80 (09/20 1800)  Labs: Recent Labs    09/01/22 0501 09/01/22 0827 09/02/22 0405 09/02/22 0515 09/02/22 0820 09/02/22 1939 09/03/22 0428 09/03/22 1929  HGB 11.9*  --  10.3*  --   --   --  9.8*  --   HCT 33.7*  --  29.9*  --   --   --  28.8*  --   PLT 127*  --  111*  --   --   --  83*  --   APTT 54*   < >  --   --  71*  --  55* 50*  HEPARINUNFRC 1.06*  --   --  1.01*  --   --  0.59  --   CREATININE 0.90   < > 0.90  --   --  0.77 0.79  --    < > = values in this interval not displayed.     Estimated Creatinine Clearance: 122.2 mL/min (by C-G formula based on SCr of 0.79 mg/dL).   Medical History: Past Medical History:  Diagnosis Date   AICD (automatic cardioverter/defibrillator) present 2005   CAD (coronary artery disease) 12/01/2013   Chronic combined systolic and diastolic CHF, NYHA class 1 ([place]) 12/01/2013   Erectile dysfunction 12/01/2013   HTN (hypertension) 12/01/2013   Hyperlipidemia 12/01/2013   Ischemic cardiomyopathy 12/01/2013   Presence of permanent cardiac pacemaker    Sleep apnea     Assessment: 45 YOM presenting as code stroke, PEA arrest in CT, negative LVO on CTA.  Hx CAD, afib on Eliquis PTA  Heparin level elevated due to recent eliquis use.  APTT is low this morning at 55 sec which is below goal range on IV heparin at 1250 units/hr. No bleeding noted. Hgb down to 9.8 but platelet count has steadily decreased to 83 from around 150s on admit. S/p possible cva will keep on lower end of range.   PM update: aPTT 50 is subtherapeutic after rate increase to heparin 1350 units/hr. Per RN no issues with IV infusion or access. No bleeding noted.    Goal of Therapy:   Heparin level 0.3-0.5 Aptt 66-90  sec Monitor platelets by anticoagulation protocol: Yes   Plan:  Increase heparin to 1450 units/hr  Check 6 hr heparin level and aPTT Daily APTT, heparin level and cbc  Cristela Felt, PharmD, BCPS Clinical Pharmacist 09/03/2022 9:03 PM

## 2022-09-03 NOTE — Progress Notes (Addendum)
Inpatient Rehab Admissions Coordinator:   Per therapy recommendations,  patient was screened for CIR candidacy by Barbera Perritt, MS, CCC-SLP . At this time, Pt. Appears to be a a potential candidate for CIR. I will request   order for rehab consult per protocol for full assessment. Please contact me any with questions.  Natoya Viscomi, MS, CCC-SLP Rehab Admissions Coordinator  [phone] (celll) [phone] (office)  

## 2022-09-03 NOTE — Evaluation (Signed)
Occupational Therapy Evaluation Patient Details Name: Gregory [name] MRN: [id number] DOB: [birth year] Today's Date: 09/03/2022   History of Present Illness 61 yo male admitted 9/16 with AMS as code stroke. Decompensated with intubation in ED and brief arrest in CT. Initial head CT demonstrated questionable dense left MCA, but CTA revealed no LVO. EEG with encephalopathy.  Extubated 9/20. PMHx metastatic colon cancer, HFrEF, VT s/p ICD, Afib, HTN -poorly controlled, DM, CAD, seizure disorder, recent intraabdominal abscess s/p drain   Clinical Impression   Patient admitted for above and presents with problem list below, including impaired cognition (awareness, problem solving, recall, safety, attention), decreased coordination, impaired balance, generalized weakness.  Pt history inconsistent, initially reporting he is independent and driving but then reports he used RW and sitting in shower. He currently requires min assist for bed mobility, min assist +2 for sit to stand but max cueing for sequencing and up to mod +2 for side stepping towards HOB, min to max assist +2 for ADLs. Pt requires max cueing throughout session for initiation and task completion, frequently requiring redirection to task. Believe he will best benefit from continued OT services acutely and after dc at AIR level to optimize independence, safety and return to PLOF.  Will follow.      Recommendations for follow up therapy are one component of a multi-disciplinary discharge planning process, led by the attending physician.  Recommendations may be updated based on patient status, additional functional criteria and insurance authorization.   Follow Up Recommendations  Acute inpatient rehab (3hours/day)    Assistance Recommended at Discharge Frequent or constant Supervision/Assistance  Patient can return home with the following Two people to help with walking and/or transfers;A lot of help with  bathing/dressing/bathroom;Assistance with cooking/housework;Direct supervision/assist for medications management;Direct supervision/assist for financial management;Assist for transportation;Help with stairs or ramp for entrance    Functional Status Assessment  Patient has had a recent decline in their functional status and demonstrates the ability to make significant improvements in function in a reasonable and predictable amount of time.  Equipment Recommendations  Other (comment) (defer)    Recommendations for Other Services Rehab consult     Precautions / Restrictions Precautions Precautions: Fall Precaution Comments: cortrak, L fem line (cleared for OOB by MD) Restrictions Weight Bearing Restrictions: No      Mobility Bed Mobility Overal bed mobility: Needs Assistance Bed Mobility: Supine to Sit, Sit to Supine     Supine to sit: Min assist Sit to supine: Min assist   General bed mobility comments: min assist for trunk support to ascend, increased time and cueing for sequencing and completion of task    Transfers Overall transfer level: Needs assistance Equipment used: 2 person hand held assist Transfers: Sit to/from Stand Sit to Stand: Min assist, +2 physical assistance, +2 safety/equipment           General transfer comment: min assist +2 to fully power up and steady at EOB (B hand hand support and use of UE support on recliner) with max cueing for sequencing      Balance Overall balance assessment: Needs assistance Sitting-balance support: No upper extremity supported, Feet supported Sitting balance-Leahy Scale: Fair Sitting balance - Comments: min guard   Standing balance support: Bilateral upper extremity supported, During functional activity Standing balance-Leahy Scale: Poor Standing balance comment: relies on BUE and external support                           ADL  either performed or assessed with clinical judgement   ADL Overall ADL's :  Needs assistance/impaired Eating/Feeding: NPO   Grooming: Minimal assistance;Bed level;Wash/dry face           Upper Body Dressing : Minimal assistance;Sitting   Lower Body Dressing: Maximal assistance;+2 for safety/equipment;+2 for physical assistance;Sit to/from stand Lower Body Dressing Details (indicate cue type and reason): assist to don socks, min assist + 2 in standing but relies on BUE support             Functional mobility during ADLs: Minimal assistance;+2 for physical assistance;+2 for safety/equipment;Moderate assistance;Cueing for sequencing;Cueing for safety General ADL Comments: pt limited by cognition, coordination, balance and weakness     Vision Baseline Vision/History: 1 Wears glasses (reading) Ability to See in Adequate Light: 0 Adequate Patient Visual Report: No change from baseline Vision Assessment?: Yes Eye Alignment: Within Functional Limits Ocular Range of Motion: Within Functional Limits Tracking/Visual Pursuits: Unable to hold eye position out of midline;Other (comment) (with difficulty sustaining attention to task) Visual Fields: No apparent deficits (grossly assessed) Depth Perception: Undershoots Additional Comments: denies diplopia, difficulty sustaining attention and following multiple step commands during testing--continue     Perception Perception Comments: mild R inattention   Praxis      Pertinent Vitals/Pain Pain Assessment Pain Assessment: Faces Faces Pain Scale: No hurt Pain Intervention(s): Monitored during session, Repositioned     Hand Dominance Right   Extremity/Trunk Assessment Upper Extremity Assessment Upper Extremity Assessment: Generalized weakness (decreased coordination R>L but question cognition limiting)   Lower Extremity Assessment Lower Extremity Assessment: Defer to PT evaluation   Cervical / Trunk Assessment Cervical / Trunk Assessment: Normal   Communication Communication Communication: No  difficulties   Cognition Arousal/Alertness: Awake/alert Behavior During Therapy: Flat affect Overall Cognitive Status: No family/caregiver present to determine baseline cognitive functioning Area of Impairment: Attention, Memory, Following commands, Safety/judgement, Awareness, Problem solving, Orientation                 Orientation Level: Disoriented to, Time (reports october) Current Attention Level: Focused Memory: Decreased short-term memory Following Commands: Follows one step commands inconsistently, Follows one step commands with increased time Safety/Judgement: Decreased awareness of safety, Decreased awareness of deficits Awareness: Intellectual Problem Solving: Slow processing, Difficulty sequencing, Requires verbal cues, Decreased initiation, Requires tactile cues General Comments: pt reports october, requires repetition and multimodal cueing to complete simple tasks with poor attention and frequent redirection.  R inattention noted. Poor awareness, reporting he could go home independently today.     General Comments  VSS on RA, on 4L upon entry decrased to RA with SPO2 maintained 98-100%    Exercises     Shoulder Instructions      Home Living Family/patient expects to be discharged to:: Private residence Living Arrangements: Children Available Help at Discharge: Family;Available 24 hours/day (reports son works from home) Type of Home: House Home Access: Level entry     [place]: Two level;Bed/bath upstairs Alternate Level Stairs-Number of Steps: 14 Alternate Level Stairs-Rails: Right;Left;Can reach both Bathroom Shower/Tub: Occupational psychologist: Standard     Home Equipment: Shower seat;Hand held Engineering geologist (2 wheels)   Additional Comments: home setup per prior admission and pt repoort- poor historian      Prior Functioning/Environment Prior Level of Function : Independent/Modified Independent;Driving;Patient poor  historian/Family not available             Mobility Comments: pt independent and going to the gym, but then reports using  RW prior to admission ADLs Comments: pt reports independent and driving        OT Problem List: Decreased strength;Decreased activity tolerance;Impaired balance (sitting and/or standing);Decreased coordination;Decreased safety awareness;Decreased knowledge of use of DME or AE;Decreased knowledge of precautions;Decreased cognition;Impaired vision/perception      OT Treatment/Interventions: Self-care/ADL training;Therapeutic exercise;DME and/or AE instruction;Therapeutic activities;Cognitive remediation/compensation;Visual/perceptual remediation/compensation;Patient/family education;Balance training;Neuromuscular education    OT Goals(Current goals can be found in the care plan section) Acute Rehab OT Goals Patient Stated Goal: home OT Goal Formulation: With patient Time For Goal Achievement: 09/17/22 Potential to Achieve Goals: Good  OT Frequency: Min 2X/week    Co-evaluation PT/OT/SLP Co-Evaluation/Treatment: Yes Reason for Co-Treatment: For patient/therapist safety;To address functional/ADL transfers   OT goals addressed during session: ADL's and self-care      AM-PAC OT "6 Clicks" Daily Activity     Outcome Measure Help from another person eating meals?: Total Help from another person taking care of personal grooming?: A Little Help from another person toileting, which includes using toliet, bedpan, or urinal?: Total Help from another person bathing (including washing, rinsing, drying)?: A Lot Help from another person to put on and taking off regular upper body clothing?: A Little Help from another person to put on and taking off regular lower body clothing?: A Lot 6 Click Score: 12   End of Session Nurse Communication: Mobility status;Precautions;Other (comment) (O2 status on RA)  Activity Tolerance: Patient tolerated treatment well Patient left: in  bed;with call bell/phone within reach;with bed alarm set;with nursing/sitter in room;with SCD's reapplied  OT Visit Diagnosis: Other abnormalities of gait and mobility (R26.89);Muscle weakness (generalized) (M62.81);Other symptoms and signs involving cognitive function;Other symptoms and signs involving the nervous system (R29.898)                Time: 1011-1040 OT Time Calculation (min): 29 min Charges:  OT General Charges $OT Visit: 1 Visit OT Evaluation $OT Eval Moderate Complexity: 1 Mod  Jolaine Artist, OT Acute Rehabilitation Services Office [phone]   Delight Stare 09/03/2022, 12:18 PM

## 2022-09-04 DIAGNOSIS — I25810 Atherosclerosis of coronary artery bypass graft(s) without angina pectoris: Secondary | ICD-10-CM

## 2022-09-04 DIAGNOSIS — R4701 Aphasia: Secondary | ICD-10-CM

## 2022-09-04 DIAGNOSIS — E782 Mixed hyperlipidemia: Secondary | ICD-10-CM

## 2022-09-04 DIAGNOSIS — I5042 Chronic combined systolic (congestive) and diastolic (congestive) heart failure: Secondary | ICD-10-CM | POA: Diagnosis not present

## 2022-09-04 DIAGNOSIS — Z95810 Presence of automatic (implantable) cardiac defibrillator: Secondary | ICD-10-CM

## 2022-09-04 DIAGNOSIS — I253 Aneurysm of heart: Secondary | ICD-10-CM

## 2022-09-04 DIAGNOSIS — I472 Ventricular tachycardia, unspecified: Secondary | ICD-10-CM

## 2022-09-04 DIAGNOSIS — I469 Cardiac arrest, cause unspecified: Secondary | ICD-10-CM | POA: Diagnosis not present

## 2022-09-04 LAB — BASIC METABOLIC PANEL
Anion gap: 4 — ABNORMAL LOW (ref 5–15)
BUN: 9 mg/dL (ref 8–23)
CO2: 27 mmol/L (ref 22–32)
Calcium: 8.1 mg/dL — ABNORMAL LOW (ref 8.9–10.3)
Chloride: 108 mmol/L (ref 98–111)
Creatinine, Ser: 0.97 mg/dL (ref 0.61–1.24)
GFR, Estimated: >60 mL/min (ref >60)
Glucose, Bld: 146 mg/dL — ABNORMAL HIGH (ref 70–99)
Potassium: 3.6 mmol/L (ref 3.5–5.1)
Sodium: 139 mmol/L (ref 135–145)

## 2022-09-04 LAB — CBC
HCT: 32.8 % — ABNORMAL LOW (ref 39.0–52.0)
Hemoglobin: 11 g/dL — ABNORMAL LOW (ref 13.0–17.0)
MCH: 31.8 pg (ref 26.0–34.0)
MCHC: 33.5 g/dL (ref 30.0–36.0)
MCV: 94.8 fL (ref 80.0–100.0)
Platelets: 114 10*3/uL — ABNORMAL LOW (ref 150–400)
RBC: 3.46 MIL/uL — ABNORMAL LOW (ref 4.22–5.81)
RDW: 21.3 % — ABNORMAL HIGH (ref 11.5–15.5)
WBC: 10.2 10*3/uL (ref 4.0–10.5)
nRBC: 0 % (ref 0.0–0.2)

## 2022-09-04 LAB — GLUCOSE, CAPILLARY
Glucose-Capillary: 139 mg/dL — ABNORMAL HIGH (ref 70–99)
Glucose-Capillary: 134 mg/dL — ABNORMAL HIGH (ref 70–99)
Glucose-Capillary: 143 mg/dL — ABNORMAL HIGH (ref 70–99)
Glucose-Capillary: 125 mg/dL — ABNORMAL HIGH (ref 70–99)
Glucose-Capillary: 139 mg/dL — ABNORMAL HIGH (ref 70–99)

## 2022-09-04 LAB — T3, FREE: T3, Free: 1 pg/mL — ABNORMAL LOW (ref 2.0–4.4)

## 2022-09-04 LAB — HEPARIN LEVEL (UNFRACTIONATED): Heparin Unfractionated: 0.38 IU/mL (ref 0.30–0.70)

## 2022-09-04 LAB — CULTURE, BLOOD (ROUTINE X 2)
Culture: NO GROWTH
Special Requests: ADEQUATE

## 2022-09-04 LAB — MAGNESIUM: Magnesium: 2 mg/dL (ref 1.7–2.4)

## 2022-09-04 LAB — PHOSPHORUS: Phosphorus: 2.3 mg/dL — ABNORMAL LOW (ref 2.5–4.6)

## 2022-09-04 LAB — APTT: aPTT: 63 seconds — ABNORMAL HIGH (ref 24–36)

## 2022-09-04 MED ORDER — ENSURE ENLIVE PO LIQD
237.0000 mL | Freq: Two times a day (BID) | ORAL | Status: DC
  Administered 2022-09-04: 237 mL via ORAL

## 2022-09-04 MED ORDER — PANTOPRAZOLE SODIUM 40 MG PO TBEC
40.0000 mg | DELAYED_RELEASE_TABLET | Freq: Every day | ORAL | Status: DC
  Administered 2022-09-04 – 2022-09-11 (×8): 40 mg via ORAL
  Filled 2022-09-04 (×8): qty 1

## 2022-09-04 MED ORDER — MEGESTROL ACETATE 40 MG PO TABS
40.0000 mg | ORAL_TABLET | Freq: Every day | ORAL | Status: DC
  Administered 2022-09-04 – 2022-09-08 (×5): 40 mg via ORAL
  Filled 2022-09-04 (×6): qty 1

## 2022-09-04 MED ORDER — AMIODARONE HCL 200 MG PO TABS: 200.0000 mg | ORAL_TABLET | Freq: Every day | ORAL | Status: DC

## 2022-09-04 MED ORDER — APIXABAN 5 MG PO TABS
5.0000 mg | ORAL_TABLET | Freq: Two times a day (BID) | ORAL | Status: DC
  Administered 2022-09-04 – 2022-09-11 (×15): 5 mg via ORAL
  Filled 2022-09-04 (×15): qty 1

## 2022-09-04 MED ORDER — LIDOCAINE 5 % EX PTCH
2.0000 | MEDICATED_PATCH | CUTANEOUS | Status: AC
  Administered 2022-09-04 – 2022-09-08 (×5): 2 via TRANSDERMAL
  Filled 2022-09-04 (×5): qty 2

## 2022-09-04 MED ORDER — METOPROLOL SUCCINATE ER 50 MG PO TB24
50.0000 mg | ORAL_TABLET | Freq: Two times a day (BID) | ORAL | Status: DC
  Administered 2022-09-04 – 2022-09-11 (×15): 50 mg via ORAL
  Filled 2022-09-04 (×15): qty 1

## 2022-09-04 MED ORDER — POTASSIUM CHLORIDE CRYS ER 10 MEQ PO TBCR
10.0000 meq | EXTENDED_RELEASE_TABLET | Freq: Every day | ORAL | Status: DC
  Administered 2022-09-04 – 2022-09-11 (×8): 10 meq via ORAL
  Filled 2022-09-04 (×8): qty 1

## 2022-09-04 MED ORDER — DAPAGLIFLOZIN PROPANEDIOL 10 MG PO TABS
10.0000 mg | ORAL_TABLET | Freq: Every day | ORAL | Status: DC
  Administered 2022-09-04 – 2022-09-11 (×8): 10 mg via ORAL
  Filled 2022-09-04 (×8): qty 1

## 2022-09-04 MED ORDER — FUROSEMIDE 40 MG PO TABS
40.0000 mg | ORAL_TABLET | Freq: Every day | ORAL | Status: DC
  Administered 2022-09-04 – 2022-09-11 (×8): 40 mg via ORAL
  Filled 2022-09-04 (×8): qty 1

## 2022-09-04 MED ORDER — DICLOFENAC SODIUM 1 % EX GEL
2.0000 g | Freq: Four times a day (QID) | CUTANEOUS | Status: DC
  Administered 2022-09-04 – 2022-09-10 (×12): 2 g via TOPICAL
  Filled 2022-09-04: qty 100

## 2022-09-04 MED ORDER — EZETIMIBE 10 MG PO TABS
10.0000 mg | ORAL_TABLET | Freq: Every day | ORAL | Status: DC
  Administered 2022-09-04 – 2022-09-11 (×8): 10 mg via ORAL
  Filled 2022-09-04 (×8): qty 1

## 2022-09-04 MED ORDER — ATORVASTATIN CALCIUM 80 MG PO TABS
80.0000 mg | ORAL_TABLET | Freq: Every evening | ORAL | Status: DC
  Administered 2022-09-04 – 2022-09-10 (×7): 80 mg via ORAL
  Filled 2022-09-04 (×7): qty 1

## 2022-09-04 MED ORDER — OXYCODONE HCL 5 MG PO TABS
5.0000 mg | ORAL_TABLET | Freq: Four times a day (QID) | ORAL | Status: DC | PRN
  Administered 2022-09-04 (×2): 5 mg via ORAL
  Filled 2022-09-04 (×4): qty 1

## 2022-09-04 MED ORDER — CLOPIDOGREL BISULFATE 75 MG PO TABS
75.0000 mg | ORAL_TABLET | Freq: Every day | ORAL | Status: DC
  Administered 2022-09-04 – 2022-09-11 (×8): 75 mg via ORAL
  Filled 2022-09-04 (×8): qty 1

## 2022-09-04 MED ORDER — TRAZODONE HCL 50 MG PO TABS
100.0000 mg | ORAL_TABLET | Freq: Every day | ORAL | Status: DC
  Administered 2022-09-04: 100 mg via ORAL
  Filled 2022-09-04: qty 2

## 2022-09-04 MED ORDER — STROKE: EARLY STAGES OF RECOVERY BOOK
Freq: Once | Status: AC
  Filled 2022-09-04: qty 1

## 2022-09-04 MED ORDER — ENSURE ENLIVE PO LIQD
237.0000 mL | Freq: Three times a day (TID) | ORAL | Status: DC
  Administered 2022-09-05 – 2022-09-11 (×10): 237 mL via ORAL

## 2022-09-04 MED ORDER — SACUBITRIL-VALSARTAN 97-103 MG PO TABS
1.0000 | ORAL_TABLET | Freq: Two times a day (BID) | ORAL | Status: DC
  Administered 2022-09-04 – 2022-09-11 (×15): 1 via ORAL
  Filled 2022-09-04 (×17): qty 1

## 2022-09-04 MED ORDER — VITAL 1.5 CAL PO LIQD
560.0000 mL | ORAL | Status: DC
  Administered 2022-09-04: 560 mL
  Filled 2022-09-04 (×2): qty 711

## 2022-09-04 MED ORDER — OXYCODONE HCL 5 MG PO TABS
10.0000 mg | ORAL_TABLET | ORAL | Status: DC | PRN
  Administered 2022-09-04 – 2022-09-11 (×27): 10 mg via ORAL
  Filled 2022-09-04 (×27): qty 2

## 2022-09-04 MED ORDER — AMIODARONE HCL 200 MG PO TABS
400.0000 mg | ORAL_TABLET | Freq: Every day | ORAL | Status: DC
  Administered 2022-09-04 – 2022-09-11 (×8): 400 mg via ORAL
  Filled 2022-09-04 (×8): qty 2

## 2022-09-04 NOTE — Plan of Care (Signed)
  Problem: Education: Goal: Knowledge of General Education information will improve Description: Including pain rating scale, medication(s)/side effects and non-pharmacologic comfort measures Outcome: Progressing   Problem: Health Behavior/Discharge Planning: Goal: Ability to manage health-related needs will improve Outcome: Progressing   Problem: Clinical Measurements: Goal: Ability to maintain clinical measurements within normal limits will improve Outcome: Progressing Goal: Will remain free from infection Outcome: Progressing Goal: Diagnostic test results will improve Outcome: Progressing Goal: Respiratory complications will improve Outcome: Progressing Goal: Cardiovascular complication will be avoided Outcome: Progressing   Problem: Activity: Goal: Risk for activity intolerance will decrease Outcome: Progressing   Problem: Nutrition: Goal: Adequate nutrition will be maintained Outcome: Progressing   Problem: Coping: Goal: Level of anxiety will decrease Outcome: Progressing   Problem: Elimination: Goal: Will not experience complications related to bowel motility Outcome: Progressing Goal: Will not experience complications related to urinary retention Outcome: Progressing   Problem: Pain Managment: Goal: General experience of comfort will improve Outcome: Progressing   Problem: Safety: Goal: Ability to remain free from injury will improve Outcome: Progressing   Problem: Skin Integrity: Goal: Risk for impaired skin integrity will decrease Outcome: Progressing   Problem: Education: Goal: Ability to describe self-care measures that may prevent or decrease complications (Diabetes Survival Skills Education) will improve Outcome: Progressing Goal: Individualized Educational Video(s) Outcome: Progressing   Problem: Coping: Goal: Ability to adjust to condition or change in health will improve Outcome: Progressing   Problem: Fluid Volume: Goal: Ability to  maintain a balanced intake and output will improve Outcome: Progressing   Problem: Health Behavior/Discharge Planning: Goal: Ability to identify and utilize available resources and services will improve Outcome: Progressing Goal: Ability to manage health-related needs will improve Outcome: Progressing   Problem: Metabolic: Goal: Ability to maintain appropriate glucose levels will improve Outcome: Progressing   Problem: Nutritional: Goal: Maintenance of adequate nutrition will improve Outcome: Progressing Goal: Progress toward achieving an optimal weight will improve Outcome: Progressing   Problem: Skin Integrity: Goal: Risk for impaired skin integrity will decrease Outcome: Progressing   Problem: Tissue Perfusion: Goal: Adequacy of tissue perfusion will improve Outcome: Progressing   Problem: Education: Goal: Knowledge of disease or condition will improve Outcome: Progressing Goal: Knowledge of secondary prevention will improve (SELECT ALL) Outcome: Progressing Goal: Knowledge of patient specific risk factors will improve (INDIVIDUALIZE FOR PATIENT) Outcome: Progressing Goal: Individualized Educational Video(s) Outcome: Progressing   Problem: Coping: Goal: Will verbalize positive feelings about self Outcome: Progressing Goal: Will identify appropriate support needs Outcome: Progressing   Problem: Health Behavior/Discharge Planning: Goal: Ability to manage health-related needs will improve Outcome: Progressing   Problem: Self-Care: Goal: Ability to participate in self-care as condition permits will improve Outcome: Progressing Goal: Verbalization of feelings and concerns over difficulty with self-care will improve Outcome: Progressing Goal: Ability to communicate needs accurately will improve Outcome: Progressing   Problem: Nutrition: Goal: Risk of aspiration will decrease Outcome: Progressing Goal: Dietary intake will improve Outcome: Progressing

## 2022-09-04 NOTE — Progress Notes (Signed)
Nutrition Follow-up  DOCUMENTATION CODES:   Severe malnutrition in context of chronic illness  INTERVENTION:   Do NOT recommend removing Cortrak at this time. Recommend continuing supplemental TF until pt appetite improving with improved po intake.   Tube Feeding via Cortrak: once TF titrated to goal rate and pt demonstrating tolerance, can trial transition to nocturnal TF Vital 1.5 at 70 ml/hr Continue to titrate to goal rate This provides 2520 kcals, 126 g of protein and 1277 mL of free water  Ensure Enlive po TID, each supplement provides 350 kcal and 20 grams of protein.  Continue Thiamine  +Constipation; recommend scheduled bowel regimen until +BM  NUTRITION DIAGNOSIS:   Severe Malnutrition related to chronic illness (cancer) as evidenced by severe fat depletion, severe muscle depletion.  Being addressed via TF, supplements,   GOAL:   Patient will meet greater than or equal to 90% of their needs  Progressing  MONITOR:   Vent status, TF tolerance, Weight trends, I & O's, Labs  REASON FOR ASSESSMENT:   Consult Enteral/tube feeding initiation and management  ASSESSMENT:   Pt with hx of metastatic colon cancer, CHF, CAD, DM type 2, chronic pancreatitis, HTN, HLD, and seizure disorder brought to ED after exhibiting acute strokelike symptoms. After arriving to ED, taken for emergent CT but found to be profoundly hypoxic and was emergently intubated. After intubation, had a PEA arrest with ROSC ~5 minutes.  9/20 Extubated 9/21 US abdomen (liver/GB) negative  Pt reports abd pain. Noted pt with hx of chronic pancreatitis, this may be contributing to pain Pt denies nausea, vomiting, diarrhea  Diet advanced to Regular today but pt reports no appetite and no interest in eating. Pt did not touch lunch tray today. Pt did drink some Ensure  Vital 1.5 infusing at 40 ml/hr via Cortrak. TF not yet at goal rate  No BM, pt currently with prn bowel regimen.   Labs:  phosphorus 2.3 (L) Meds: lasix. Thiamine, KCl, MVI, megace    Diet Order:   Diet Order             Diet regular Room service appropriate? Yes; Fluid consistency: Thin; Fluid restriction: 1800 mL Fluid  Diet effective now                   EDUCATION NEEDS:   Not appropriate for education at this time  Skin:  Skin Assessment: Skin Integrity Issues: Skin Integrity Issues:: Stage I Stage I: sacrum  Last BM:  no BM  Height:   Ht Readings from Last 1 Encounters:  08/29/22 '6\' 6"'$  (1.981 m)    Weight:   Wt Readings from Last 1 Encounters:  09/04/22 101.9 kg    Ideal Body Weight:  97.3 kg  BMI:  Body mass index is 25.96 kg/m.  Estimated Nutritional Needs:   Kcal:  2500-2700 kcal/d  Protein:  125-140g/d  Fluid:  2.5L/d    Kerman Passey MS, RDN, LDN, CNSC Registered Dietitian 3 Clinical Nutrition RD Pager and On-Call Pager Number Located in [place]

## 2022-09-04 NOTE — Progress Notes (Signed)
NAME:  Gregory [name], MRN:  [id number], DOB:  [birth year], LOS: 5 ADMISSION DATE:  08/30/2022, CONSULTATION DATE:  08/30/22 REFERRING MD:  Kathrynn Humble, CHIEF COMPLAINT:  cardiac arrest   History of Present Illness:  61 yo PMH metastatic colon cancer, HFrEF, VT s/p ICD, Afib, HTN -poorly controlled, DM, CAD, seizure disorder, recent intraabdominal abscess s/p drain presented to ED 08/30/22 with AMS as code stroke. LKN 9/16 0830. There was also concern for possible seizure like activity en route to hospital and pt received 2.'5mg'$  versed for this. Witnessed seizure activity in ED, received 4400 keppra and Ativan.  CT H with L MCA hyperdensity.  Patient decompensated after CT, becoming more hypoxic and was not able to protect his airway. Intubated with etomidate + roc. Taken for CTA Head/neck, but had a PEA arrest requiring about 5 min ACLS.  CTA Head/neck without LVO. R ICA stenosis, R vertebral artery calcific stenosis  CTA chest without evidence of PE. Looks like he aspirated, has rib fx in setting of CPR, small pleural effusion   After ROSC pt has remained unstable, requiring NE. Having runs on VT in ED.   PCCM consulted for admission in this setting   Pertinent  Medical History  Metastatic colon cancer Seizure disorder HTN OSA DM2 Recurrent VT Afib CAD IAA  Chronic pancreatitis  Splenic artery pseudoaneurysm  Significant Hospital Events: Including procedures, antibiotic start and stop dates in addition to other pertinent events   9/16 to ED as Code stroke + concern for seizure. L MCA stroke. Hypoxic, req ETT. Brief PEA arrest. Shock. CCM called to admit  9/19 awake and following commands.  Still periods of apnea - switched to Precedex  9/20 extubated.  9/21 c/o RUQ pain but with negative [place]   Interim History / Subjective:  Still having RUQ pain.   Objective   Blood pressure (!) 148/110, pulse 87, temperature 99.7 F (37.6 C), resp. rate 15, weight 101.9 kg, SpO2 100  %.        Intake/Output Summary (Last 24 hours) at 09/04/2022 0924 Last data filed at 09/04/2022 0800 Gross per 24 hour  Intake 1851.51 ml  Output 2040 ml  Net -188.49 ml    Filed Weights   09/02/22 0500 09/03/22 0457 09/04/22 0500  Weight: 97.8 kg 98.1 kg 101.9 kg    Examination: General: In chair in no distress.  HENT: moist mucus membranes.  Lungs: Chest clear.  On room air Cardiovascular: s1s2 cap refill < 3sec  Abdomen: soft ndnt hypoactive, subjective tenderness RUQ Extremities: no acute joint deformity  Neuro: Intact. GU: Foley catheter in place.   Ancillary test personally reviewed:    Assessment & Plan:   Was critically ill due to PEA Cardiac arrest due to suspected aspiration event following seizure, with evidence of hypoxic ischemic encephalopathy.  -Appears to have made a near complete neurological recovery.  Aspiration pneumonia as demonstrated by abnormal chest x-ray, new infiltrates consistent with pneumonia. - completed course of antibiotics.   Status epilepticus, known idiopathic epilepsy -On 500 keppra BID at home, increased to 1g Keppra q12hr by Neurology  HFrEF Recurrent VT  Afib on eliquis  CAD  Poorly controlled HTN -Continue amio -Restart Eliquis, Plavix - Restarted home HF therapy. - Cardiology to see for HF therapy optimization.   Metastatic colon cancer  -functional status and comorbidities (poorly controlled HTN, CHF) limit some chemo options - RUQ pain likely from mets. Oxycodone for pain.  -on Xeloda outpt   Goals of Care -decision makers  are son and daughter.  On admission conveyed recommendation for DNR status, and recommended coming to hospital to discuss further.  Needs to be readdressed prior to discharge given neurological recovery.   Best Practice (right click and "Reselect all SmartList Selections" daily)   Diet/type: Regular diet DVT prophylaxis: Transition back to Eliquis GI prophylaxis: PPI Lines: Remove  today Foley:  Remove today Code Status: DNR if re-arrests. Will need to revisit. Last date of multidisciplinary goals of care discussion [daughter updated at the bedside. ]  Ready for transfer, hospitalist notified and orders reconciled.   Kipp Brood, MD  [place] for contact info 09/04/2022, 9:24 AM

## 2022-09-04 NOTE — Progress Notes (Signed)
Heart Failure Stewardship Pharmacist Progress Note   PCP: Patient, No Pcp Per PCP-Cardiologist: Sanda Klein, MD    HPI:  61 yo M with PMH of HFrEF, VT s/p ICD, afib, HTN, T2DM, seizure disorder, and metastatic colon cancer stage IV.  He presented to the ED on 9/16 with AMS and seizures. Code stroke activated. Patient decompensated after CT (found MCA stroke) and was intubated and then had PEA arrest. Concern for aspiration. CTA revealed no LVO. ECHO showed LVEF 35-40% (stable from prior) with severe LVH, RV mildly reduced. Extubated on 9/20.   Current HF Medications: Diuretic: furosemide 40 mg PO daily Beta Blocker: metoprolol XL 50 mg BID ACE/ARB/ARNI: Entresto 97/103 mg BID SGLT2i: Farxiga 10 mg daily  Prior to admission HF Medications: Diuretic: furosemide 40 mg daily Beta blocker: metoprolol XL 50 mg BID ACE/ARB/ARNI: Entresto 97/103 mg BID SGLT2i: Farxiga 10 mg daily  Pertinent Lab Values: Serum creatinine 0.92, BUN 10, Potassium 3.8, Sodium 142, BNP 607, Magnesium 2.0, A1c 6.3    Vital Signs: Weight: 224 lbs (admission weight: 201 lbs) Blood pressure: 130/80s  Heart rate: 70-80s  I/O: -3L yesterday; net +8.7L  Medication Assistance / Insurance Benefits Check: Does the patient have prescription insurance?  Yes Type of insurance plan: [employer]  Outpatient Pharmacy:  Prior to admission outpatient pharmacy: [facility] Is the patient willing to use [facility] at discharge? Yes Is the patient willing to transition their outpatient pharmacy to utilize a [facility] outpatient pharmacy?   Pending    Assessment: 1) Chronic systolic CHF (LVEF 15-72%), due to ICM. NYHA class II symptoms. - Continue furosemide 40 mg PO daily. Weight up significantly this admission with the amount of fluids he needed to receive (+8L net). Watch for signs of fluid overload. - Continue metoprolol XL 50 mg BID. Has history of VT with episodes in the ED.  - Continue  Entresto 97/103 mg BID - Consider adding spironolactone 25 mg daily to optimize GDMT - Continue Farxiga 10 mg daily - Caution nitrate use with outpatient sildenafil prn prescription    Plan: 1) Medication changes recommended at this time: - Add spironolactone 25 mg daily  2) Patient assistance: - Has [employer], can use monthly copay cards for medications  3)  Education  - To be completed prior to discharge  Kerby Nora, PharmD, BCPS Heart Failure Cytogeneticist Phone [phone]

## 2022-09-04 NOTE — Progress Notes (Signed)
Physical Therapy Treatment Patient Details Name: Gregory [name] MRN: [id number] DOB: 05-Feb-1961 Today's Date: 09/04/2022   History of Present Illness Pt is a 61 y.o. male admitted 08/30/22 with AMS as code stroke. Pt decompensated in ED requiring intubation, brief PEA arrest in CT requiring 1x round CPR. Initial head CT with questionable dense L MCA, but CTA revealed no LVO. EEG 9/17 with encephalopathy, no seizure activity. Chest CTA with small pleural effusion, rib fx in setting of CPR. ETT 9/16-9/20. PMH includes metastatic colon CA, HFrEF, VT s/p ICD, afib, HTN, DM, CAD, seizure disorder, recent intraabdominal abscess s/p drain.    PT Comments    PT pleasant, alert, able to walk short distances today limited by pain and fatigue. Pt with limited tolerance for activity and decreased awareness of balance. Pt educated for HEP, continued progression in transfers and gait. Will continue to follow.  HR 106 BP 182/90 EOB with report of lightheadedness with BP 180/95    Recommendations for follow up therapy are one component of a multi-disciplinary discharge planning process, led by the attending physician.  Recommendations may be updated based on patient status, additional functional criteria and insurance authorization.  Follow Up Recommendations  Acute inpatient rehab (3hours/day)     Assistance Recommended at Discharge Frequent or constant Supervision/Assistance  Patient can return home with the following A lot of help with walking and/or transfers;A lot of help with bathing/dressing/bathroom;Assistance with cooking/housework;Direct supervision/assist for medications management;Direct supervision/assist for financial management;Assist for transportation;Help with stairs or ramp for entrance   Equipment Recommendations       Recommendations for Other Services       Precautions / Restrictions Precautions Precautions: Fall;Other (comment) Precaution Comments: cortrak, Rt rib  pain     Mobility  Bed Mobility Overal bed mobility: Needs Assistance Bed Mobility: Rolling, Sidelying to Sit Rolling: Mod assist Sidelying to sit: Mod assist       General bed mobility comments: physical assist to roll and rise from side    Transfers Overall transfer level: Needs assistance   Transfers: Sit to/from Stand Sit to Stand: Min assist, +2 safety/equipment, From elevated surface           General transfer comment: min +2 to stand from bed and from recliner elevated with pillows with cues for hand placement and safety    Ambulation/Gait Ambulation/Gait assistance: Mod assist, +2 physical assistance Gait Distance (Feet): 10 Feet Assistive device: Rolling walker (2 wheels) Gait Pattern/deviations: Step-through pattern, Decreased stride length, Trunk flexed   Gait velocity interpretation: <1.31 ft/sec, indicative of household ambulator   General Gait Details: pt with posterior lean, trunk flexed, mod assist for balance with cues to step into RW with close chair follow. Pt stood from chair 2nd trial for gait with ability to stand 30 sec but only take 2 steps then had to sit again due to pain and fatigue   Stairs             Wheelchair Mobility    Modified Rankin (Stroke Patients Only)       Balance Overall balance assessment: Needs assistance   Sitting balance-Leahy Scale: Fair Sitting balance - Comments: min guard   Standing balance support: Bilateral upper extremity supported, During functional activity Standing balance-Leahy Scale: Poor Standing balance comment: relies on BUE and external support with posterior lean                            Cognition Arousal/Alertness: Awake/alert  Behavior During Therapy: Flat affect Overall Cognitive Status: Impaired/Different from baseline Area of Impairment: Following commands, Safety/judgement                 Orientation Level: Time Current Attention Level: Sustained Memory:  Decreased short-term memory Following Commands: Follows one step commands with increased time, Follows one step commands inconsistently Safety/Judgement: Decreased awareness of safety, Decreased awareness of deficits   Problem Solving: Slow processing, Difficulty sequencing, Requires verbal cues, Decreased initiation, Requires tactile cues General Comments: pt with decreased awareness of deficits and command following        Exercises General Exercises - Lower Extremity Long Arc Quad: AROM, Both, 15 reps, Seated    General Comments        Pertinent Vitals/Pain Pain Assessment Pain Assessment: 0-10 Pain Score: 5  Pain Location: right lower chest, rib pain Pain Descriptors / Indicators: Aching, Guarding Pain Intervention(s): Limited activity within patient's tolerance, Repositioned, Monitored during session    Home Living                          Prior Function            PT Goals (current goals can now be found in the care plan section) Progress towards PT goals: Progressing toward goals    Frequency    Min 4X/week      PT Plan Current plan remains appropriate    Co-evaluation              AM-PAC PT "6 Clicks" Mobility   Outcome Measure  Help needed turning from your back to your side while in a flat bed without using bedrails?: A Lot Help needed moving from lying on your back to sitting on the side of a flat bed without using bedrails?: A Lot Help needed moving to and from a bed to a chair (including a wheelchair)?: A Lot Help needed standing up from a chair using your arms (e.g., wheelchair or bedside chair)?: Total Help needed to walk in hospital room?: Total Help needed climbing 3-5 steps with a railing? : Total 6 Click Score: 9    End of Session Equipment Utilized During Treatment: Gait belt Activity Tolerance: Patient tolerated treatment well Patient left: in chair;with call bell/phone within reach;with chair alarm set Nurse  Communication: Mobility status PT Visit Diagnosis: Other abnormalities of gait and mobility (R26.89);Difficulty in walking, not elsewhere classified (R26.2);Other symptoms and signs involving the nervous system (R29.898)     Time: 1191-4782 PT Time Calculation (min) (ACUTE ONLY): 28 min  Charges:  $Gait Training: 8-22 mins $Therapeutic Activity: 8-22 mins                     Bayard Males, PT Acute Rehabilitation Services Office: [place] 09/04/2022, 10:03 AM

## 2022-09-04 NOTE — Progress Notes (Signed)
ANTICOAGULATION CONSULT NOTE - Follow Up Consult  Pharmacy Consult for heparin Indication: Afib  No Known Allergies  Patient Measurements: Weight: 101.9 kg (224 lb 10.4 oz) Heparin Dosing Weight: 89kg  Vital Signs: Temp: 99.7 F (37.6 C) (09/21 0700) Temp Source: Bladder (09/21 0000) BP: 148/110 (09/21 0700) Pulse Rate: 87 (09/21 0700)  Labs: Recent Labs    09/02/22 0405 09/02/22 0515 09/02/22 0820 09/02/22 1939 09/03/22 0428 09/03/22 1929  HGB 10.3*  --   --   --  9.8*  --   HCT 29.9*  --   --   --  28.8*  --   PLT 111*  --   --   --  83*  --   APTT  --   --  71*  --  55* 50*  HEPARINUNFRC  --  1.01*  --   --  0.59  --   CREATININE 0.90  --   --  0.77 0.79  --      Estimated Creatinine Clearance: 122.2 mL/min (by C-G formula based on SCr of 0.79 mg/dL).   Medical History: Past Medical History:  Diagnosis Date   AICD (automatic cardioverter/defibrillator) present 2005   CAD (coronary artery disease) 12/01/2013   Chronic combined systolic and diastolic CHF, NYHA class 1 ([place]) 12/01/2013   Erectile dysfunction 12/01/2013   HTN (hypertension) 12/01/2013   Hyperlipidemia 12/01/2013   Ischemic cardiomyopathy 12/01/2013   Presence of permanent cardiac pacemaker    Sleep apnea     Assessment: 1 YOM presenting as code stroke, PEA arrest in CT, negative LVO on CTA.  Hx CAD, afib on Eliquis PTA  Heparin level now correlating and at goal on heparin at 1450 units/hr. No bleeding noted. Hgb up to 11, and platelet count up today to 114, around 150s on admit. S/p possible cva will keep on lower end of range.   Goal of Therapy:  Heparin level 0.3-0.5 Aptt 66-90  sec Monitor platelets by anticoagulation protocol: Yes   Plan:  Continue heparin to 1450 units/hr  Daily APTT, heparin level and cbc Follow up ability to go back on apixaban  Erin Hearing PharmD., BCPS Clinical Pharmacist 09/04/2022 7:58 AM

## 2022-09-04 NOTE — TOC Initial Note (Signed)
Transition of Care [facility]) - Initial/Assessment Note    Patient Details  Name: Gregory [name] MRN: [id number] Date of Birth: 05-28-1961  [facility]) CM/SW Contact:    Bethena Roys, RN Phone Number: 09/04/2022, 11:17 AM  Clinical Narrative: Risk for readmission assessment completed. PTA patient was from home with his son and has support of both son and daughter. Patient does not drive and children provide transportation to appointments. Patient gets his medications without any issues. Physical Therapy recommendations are for Inpatient Rehab. CIR Admissions coordinator is following the patient. Case Manager will continue to follow for transition of care needs as the patient progresses.   Expected Discharge Plan: IP Rehab Facility Barriers to Discharge: Continued Medical Work up   Patient Goals and CMS Choice Patient states their goals for this hospitalization and ongoing recovery are:: less pain and to return home.      Expected Discharge Plan and Services Expected Discharge Plan: [place] In-house Referral: NA Discharge Planning Services: CM Consult Post Acute Care Choice: IP Rehab Living arrangements for the past 2 months: Single Family Home                 DME Arranged: N/A DME Agency: NA       HH Arranged: NA      Prior Living Arrangements/Services Living arrangements for the past 2 months: Single Family Home Lives with:: Self, Adult Children Patient language and need for interpreter reviewed:: Yes Do you feel safe going back to the place where you live?: Yes      Need for Family Participation in Patient Care: Yes (Comment) Care giver support system in place?: Yes (comment) Current home services:  (cane in the home.) Criminal Activity/Legal Involvement Pertinent to Current Situation/Hospitalization: No - Comment as needed  Permission Sought/Granted Permission sought to share information with : Family Supports, Case  Manager                Emotional Assessment Appearance:: Appears stated age       Alcohol / Substance Use: Not Applicable Psych Involvement: No (comment)  Admission diagnosis:  Cardiac arrest [facility]) [I46.9] Patient Active Problem List   Diagnosis Date Noted   Protein-calorie malnutrition, severe 09/01/2022   Pressure injury of skin 08/31/2022   Cardiac arrest ([place]) 08/30/2022   Acute respiratory failure with hypoxia ([place])    Status epilepticus ([place])    Postprocedural intraabdominal abscess 05/22/2022   Controlled type 2 diabetes mellitus without complication, without long-term current use of insulin ([place]) 05/08/2022   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  05/08/2022   Ileus following gastrointestinal surgery ([place]) 05/08/2022   Hypothyroidism 05/08/2022   Leukocytosis 05/08/2022   Postoperative ileus ([place]) 04/22/2022   Cancer of right colon ([place]) 04/17/2022   Benign neoplasm of colon    Anticoagulated    Symptomatic anemia/iron deficiency anemia 04/12/2022   ICD (implantable cardioverter-defibrillator) in place 03/21/2022   Slow transit constipation    Lipoma    Sleep disturbance    Hypoalbuminemia due to protein-calorie malnutrition (HCC)    Seizures (HCC)    Paroxysmal atrial fibrillation ([place])    Debility 09/29/2020   Shock ([place]) 09/13/2020   Thrombocytopenia ([place])    Localization-related idiopathic epilepsy and epileptic syndromes with seizures of localized onset, not intractable, without status epilepticus ([place]) 10/20/2018   Anemia 06/07/2018   Chronic combined systolic and diastolic CHF (congestive heart failure) (HCC)/ischemic cardiomyopathy 12/01/2013   CAD S/P percutaneous coronary angioplasty 12/01/2013   Erectile  dysfunction 12/01/2013   Essential hypertension 12/01/2013   Hypercholesterolemia 12/01/2013   OSA on CPAP 12/01/2013   PCP:  Patient, No Pcp Per Pharmacy:   [facility] [place], [place] - [address] [place] [address] Phone: [phone] Fax: [phone]  Readmission Risk Interventions    09/04/2022   11:14 AM 04/22/2022   12:49 PM  Readmission Risk Prevention Plan  Transportation Screening Complete Complete  PCP or Specialist Appt within 3-5 Days  Complete  HRI or Home Care Consult  Complete  Social Work Consult for [place] Planning/Counseling  Complete  Palliative Care Screening  Not Applicable  Medication Review Press photographer) Complete Complete  PCP or Specialist appointment within 3-5 days of discharge Complete   HRI or [place] Complete   SW Recovery Care/Counseling Consult Complete   Palliative Care Screening Not [place] Not Applicable

## 2022-09-04 NOTE — Consult Note (Addendum)
Cardiology Consultation   Patient ID: Gregory [name] MRN: [id number]; DOB: [birth year]  Admit date: 08/30/2022 Date of Consult: 09/04/2022  PCP:  Patient, No Pcp Per   [facility] Cardiologist:  Sanda Klein, MD  Electrophysiologist:  Virl Axe, MD  {  Patient Profile:   Gregory [name] is a 61 y.o. male with a hx of paroxysmal atrial fibrillation, chronic systolic and diastolic congestive heart failure, ischemic cardiomyopathy s/p ICD, CAD, colon cancer with liver metastasis s/p R hemicolectomy, intra-abdominal abscess s/p drain recurrent VT with different morphology, obstructive sleep apnea, hyperlipidemia who is being seen 09/04/2022 for the evaluation of PEA arrest at the request of Dr. Wallene Dales.  Echo 02/2013: LVEF 45-50% Echo 04/2016: LVEF 60-65% Echo 05/2018: LVEF 60-65% Echo 08/2020: LVEF 55-60% Echo 09/2020: LVEF 40-45%  Patient with longstanding history of ischemic cardiomyopathy with defibrillation implantation in 2006. Hx of recurrent VT with different morphology.  Seen by Dr. Lovena Le 03/2022 for VT ablation but fell symptoms exacerbation by CHF.  Echo was repeated 04/07/22 with slightly worsening EF 35-40%. He was arranged for left heart cath on 04/11/22 for further evaluation. However admitted with symptomatic anemia with hemoglobin of 4. S/p 4 units of PRBCs. Cardiology was consulted for pre-op evaluation prior to colonoscopy/EGD. He was ultimately found to have a mass in his colon and then underwent laparoscopic right colon resection on 04/18/2022.   Cath 04/17/2022 with relatively stable findings similar to previous, but there are some moderate upstream lesions in the OM1 and D1, just upstream of the stents. Otherwise he has known occluded distal circumflex and RCA. No intervention.   Admitted 5/25-6/6/8 for intra abdominal abscess requiring IR drain placement.  During admission, he had a recurrent VT storm s/p ICD shock which felt due  to abdominal issue.   History of Present Illness:   Gregory [name] presented 9/16 with acute onset aphasia.  Seizure-like activity in route via EMS and given Versed.  CT showed  possible left MCA stroke.  Had witnessed seizure in ER.  He was decompensated after CT in ER requiring intubation followed by PEA arrest s/p 5 minutes of ACLS with ROSC.  Required pressures and had runs of VT in ER.  CT angio of chest without pulmonary embolism but features consistent with aspiration pneumonia leading to hypoxia.  Admitted under critical care team.  Given broad-spectrum antibiotic.  Patient has meaningful neurological recovery and following commands. Extubated 9/20. May need MRI of brain.  Complaining of right upper abdominal pain but no acute finding on abdominal ultrasound.  Limited echocardiogram 08/30/2022 with LV function of 35 to 40%, severe LVH.  Regional wall motion abnormality which is new compared to prior echocardiogram/20 03/2022, concerning for LAD territory ischemia.  Patient was anticoagulated with heparin since admit however switched to Eliquis this morning and got dose at 10 AM.  He has no recollection of the event. Denies chest pain or dyspnea. Reports lives at home with family.   Echo 08/30/2022  1. Limited echo for LVEF   2. Left ventricular ejection fraction, by estimation, is 35 to 40%. The  left ventricle has moderately decreased function. The left ventricle  demonstrates regional wall motion abnormalities (see scoring  diagram/findings for description). There is severe  left ventricular hypertrophy. There is severe hypokinesis of the left  ventricular, entire anteroseptal wall, anterior wall and apical segment.   3. Right ventricular systolic function is mildly reduced. The right  ventricular size is normal.   4. The tricuspid valve  is abnormal. Tricuspid valve regurgitation is mild  to moderate.   Comparison(s): Prior images unable to be directly viewed, comparison made  by report  only. Changes from prior study are noted. 04/07/2022: LVEF  35-40%, inferior and lateral hypokinesis.   Conclusion(s)/Recommendation(s): Wall motion abnormalities are new  compared to prior echo are suggestive of LAD territory ischemia. Critical  findings reported to Dr. Valeta Harms and acknowledged at 4:30 pm.    Past Medical History:  Diagnosis Date   AICD (automatic cardioverter/defibrillator) present 2005   CAD (coronary artery disease) 12/01/2013   Chronic combined systolic and diastolic CHF, NYHA class 1 ([place]) 12/01/2013   Erectile dysfunction 12/01/2013   HTN (hypertension) 12/01/2013   Hyperlipidemia 12/01/2013   Ischemic cardiomyopathy 12/01/2013   Presence of permanent cardiac pacemaker    Sleep apnea     Past Surgical History:  Procedure Laterality Date   ACHILLES TENDON REPAIR     lft foot   BIOPSY  04/16/2022   Procedure: BIOPSY;  Surgeon: Yetta Flock, MD;  Location: [place];  Service: Gastroenterology;;   CARDIAC CATHETERIZATION N/A 05/05/2016   Procedure: Left Heart Cath and Coronary Angiography;  Surgeon: Leonie Man, MD;  Location: [place] [facility];  Service: Cardiovascular;  Laterality: N/A;   CARDIAC DEFIBRILLATOR PLACEMENT     CARDIOVERSION N/A 09/13/2020   Procedure: CARDIOVERSION;  Surgeon: Werner Lean, MD;  Location: [place] [facility];  Service: Cardiovascular;  Laterality: N/A;   CARDIOVERSION N/A 09/19/2020   Procedure: CARDIOVERSION;  Surgeon: [name], MD;  Location: [facility];  Service: Cardiovascular;  Laterality: N/A;   COLON RESECTION N/A 04/18/2022   Procedure: HAND ASSISTED LAPAROSCOPIC RIGHT COLON RESECTION;  Surgeon: Felicie Morn, MD;  Location: [place];  Service: General;  Laterality: N/A;   COLONOSCOPY WITH PROPOFOL N/A 04/16/2022   Procedure: COLONOSCOPY WITH PROPOFOL;  Surgeon: Yetta Flock, MD;  Location: [place];  Service: Gastroenterology;  Laterality: N/A;   EP IMPLANTABLE DEVICE N/A 12/19/2016    Procedure: ICD Generator Changeout;  Surgeon: [name], MD;  Location: [place] [facility];  Service: Cardiovascular;  Laterality: N/A;   ESOPHAGOGASTRODUODENOSCOPY N/A 04/16/2022   Procedure: ESOPHAGOGASTRODUODENOSCOPY (EGD);  Surgeon: Yetta Flock, MD;  Location: [facility] ENDOSCOPY;  Service: Gastroenterology;  Laterality: N/A;   IR ANGIOGRAM SELECTIVE EACH ADDITIONAL VESSEL  05/09/2022   IR ANGIOGRAM VISCERAL SELECTIVE  05/09/2022   IR CATHETER TUBE CHANGE  05/14/2022   IR CATHETER TUBE CHANGE  06/27/2022   IR EMBO ART  VEN HEMORR LYMPH EXTRAV  INC GUIDE ROADMAPPING  05/09/2022   IR IMAGE GUIDED DRAINAGE PERCUT CATH  PERITONEAL RETROPERIT  05/09/2022   IR PATIENT EVAL TECH 0-60 MINS  06/11/2022   IR RADIOLOGIST EVAL & MGMT  06/26/2022   IR RADIOLOGIST EVAL & MGMT  07/16/2022   IR US GUIDE VASC ACCESS RIGHT  05/09/2022   LEFT HEART CATH AND CORONARY ANGIOGRAPHY N/A 04/17/2022   Procedure: LEFT HEART CATH AND CORONARY ANGIOGRAPHY;  Surgeon: Belva Crome, MD;  Location: [place] [facility];  Service: Cardiovascular;  Laterality: N/A;   POLYPECTOMY  04/16/2022   Procedure: POLYPECTOMY;  Surgeon: Yetta Flock, MD;  Location: [facility] ENDOSCOPY;  Service: Gastroenterology;;   RIGHT/LEFT HEART CATH AND CORONARY ANGIOGRAPHY N/A 09/24/2020   Procedure: RIGHT/LEFT HEART CATH AND CORONARY ANGIOGRAPHY;  Surgeon: Lorretta Harp, MD;  Location: [place] [facility];  Service: Cardiovascular;  Laterality: N/A;   SUBMUCOSAL TATTOO INJECTION  04/16/2022   Procedure: SUBMUCOSAL TATTOO  INJECTION;  Surgeon: Yetta Flock, MD;  Location: [facility] ENDOSCOPY;  Service: Gastroenterology;;   WRIST TENODESIS      Inpatient Medications: Scheduled Meds:  amiodarone  400 mg Oral Daily   apixaban  5 mg Oral BID   atorvastatin  80 mg Oral QPM   Chlorhexidine Gluconate Cloth  6 each Topical Daily   clopidogrel  75 mg Oral Daily   dapagliflozin propanediol  10 mg Oral Daily   diclofenac Sodium  2 g Topical QID    ezetimibe  10 mg Oral Daily   feeding supplement  237 mL Oral BID BM   furosemide  40 mg Oral Daily   insulin aspart  0-9 Units Subcutaneous Q4H   levETIRAcetam  1,000 mg Oral BID   levothyroxine  50 mcg Oral Q0600   megestrol  40 mg Oral Daily   metoprolol succinate  50 mg Oral BID   multivitamin with minerals  1 tablet Oral Daily   mouth rinse  15 mL Mouth Rinse 4 times per day   pantoprazole  40 mg Oral Daily   potassium chloride  10 mEq Oral Daily   sacubitril-valsartan  1 tablet Oral BID   sodium chloride flush  10-40 mL Intracatheter Q12H   [START ON 09/05/2022] thiamine  100 mg Oral Daily   traZODone  100 mg Oral QHS   Continuous Infusions:  sodium chloride Stopped (09/03/22 1842)   feeding supplement (VITAL 1.5 CAL) 40 mL/hr at 09/04/22 1200   lactated ringers Stopped (09/03/22 1223)   PRN Meds: sodium chloride, acetaminophen, docusate sodium, LORazepam, ondansetron (ZOFRAN) IV, mouth rinse, oxyCODONE, polyethylene glycol, sodium chloride flush  Allergies:   No Known Allergies  Social History:   Social History   Socioeconomic History   Marital status: Single    Spouse name: Not on file   Number of children: Not on file   Years of education: Not on file   Highest education level: Not on file  Occupational History   Not on file  Tobacco Use   Smoking status: Former    Packs/day: 1.00    Years: 30.00    Total pack years: 30.00    Types: Cigarettes    Quit date: 05/31/2013    Years since quitting: 9.2   Smokeless tobacco: Never  Vaping Use   Vaping Use: Never used  Substance and Sexual Activity   Alcohol use: Not Currently    Comment: 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     Right handed   Social Determinants of Health   Financial Resource Strain: High Risk (05/06/2022)   Overall Financial Resource Strain (CARDIA)    Difficulty of Paying Living Expenses: Hard  Food Insecurity: Not on file  Transportation Needs: Not on file  Physical Activity: Not on file  Stress: Not on file  Social Connections: Not on file  Intimate Partner Violence: Not on file    Family History:   Family History  Problem Relation Age of Onset   Heart disease Mother    Brain cancer Father    Hypertension Daughter      ROS:  Please see the history of present illness.  All other ROS reviewed and negative.     Physical Exam/Data:   Vitals:   09/04/22 1000 09/04/22 1100 09/04/22 1200 09/04/22 1204  BP: (!) 163/92 (!) 140/89 (!) 131/116   Pulse: 75 (!) 104 89   Resp: '16 19 18   '$ Temp:    98.2 F (36.8 C)  TempSrc:    Oral  SpO2: 96% 97% 96%   Weight:        Intake/Output Summary (Last 24 hours) at 09/04/2022 1324 Last data filed at 09/04/2022 1200 Gross per 24 hour  Intake 1739.87 ml  Output 1340 ml  Net 399.87 ml      09/04/2022    5:00 AM 09/03/2022    4:57 AM 09/02/2022    5:00 AM  Last 3 Weights  Weight (lbs) 224 lb 10.4 oz 216 lb 4.3 oz 215 lb 9.8 oz  Weight (kg) 101.9 kg 98.1 kg 97.8 kg     Body mass index is 25.96 kg/m.  General:  ill appearing male in no acute distress HEENT: normal Neck: no JVD Vascular: No carotid bruits; Distal pulses 2+ bilaterally Cardiac:  normal S1, S2; RRR; no murmur  Lungs:  clear to auscultation bilaterally, no wheezing, rhonchi or rales  Abd: soft, nontender, no hepatomegaly  Ext: no edema Musculoskeletal:  No deformities, BUE and BLE strength normal and equal Skin: warm and dry  Neuro:  CNs 2-12 intact, no focal abnormalities noted Psych:  Normal affect   EKG:  The EKG was personally reviewed and demonstrates:  Atrial fibrillation, LBBB Telemetry:  Telemetry was personally reviewed and demonstrates:  paced rhythm,  afib  Relevant CV Studies:  Echo 04/07/22 1. Difficult acoustic windows Endocardium not well seen Overall there  does not appear to be a signfiicant change from echo in 2021.   2. LV function is depressed with hypokinesis of the lateral wall,  mid/distal inferior wall and apex; akinesis with aneurysmal dilitation of  inferior base. . Left ventricular ejection fraction, by estimation, is 35  to 40%. The left ventricle has  moderately decreased function. The left ventricular internal cavity size  was moderately dilated. There is mild left ventricular hypertrophy. Left  ventricular diastolic parameters are indeterminate.   3. Right ventricular systolic function is normal. The right ventricular  size is normal. There is mildly elevated pulmonary artery systolic  pressure.   4. Left atrial size was severely dilated.   5. Right atrial size was mildly dilated.   6. The mitral valve is normal in structure. Mild mitral valve  regurgitation.   7. The aortic valve is tricuspid. Aortic valve regurgitation is not  visualized. Aortic valve sclerosis/calcification is present, without any  evidence of aortic stenosis.   8. There is borderline dilatation of the aortic root, measuring 39 mm.  There is borderline dilatation of the ascending aorta, measuring 39 mm.   LEFT HEART CATH AND CORONARY ANGIOGRAPHY  04/17/22   Conclusion  CONCLUSIONS: Widely patent left main Mid segment of LAD contains 40 to 50% narrowing.  The large first diagonal contains a patent stent in the mid body.  Proximal to the stent is a 50% stenosis. Circumflex is totally occluded beyond the origin of the large first obtuse marginal.  Left-to-right collaterals are noted.  The proximal circumflex before the origin of the first obtuse marginal contains 50 to 70% stenosis.  There is progression of this lesion compared to prior. Right coronary is totally occluded proximally.  It reconstitutes in the distal segment.  PDA is  totally  occluded.  PDA and left ventricular branches fill via left to right collaterals. Recommendations: Per managing team. Anatomy is similar to prior with the following exceptions: 50% to 70% stenosis proximal to the large first obtuse marginal.  40 to 50% stenosis proximal to the stent segment in the first diagonal.     Diagnostic Dominance: Right  Laboratory Data:  High Sensitivity Troponin:   Recent Labs  Lab 08/30/22 1427 08/30/22 1845  TROPONINIHS 46* 90*     Chemistry Recent Labs  Lab 09/02/22 0405 09/02/22 1939 09/03/22 0428 09/04/22 0720  NA 137 139 140 139  K 3.1* 3.9 4.0 3.6  CL 106 110 108 108  CO2 '25 24 24 27  '$ GLUCOSE 177* 170* 145* 146*  BUN '16 13 13 9  '$ CREATININE 0.90 0.77 0.79 0.97  CALCIUM 7.7* 7.4* 8.0* 8.1*  MG 2.0 2.1  --  2.0  GFRNONAA >60 >60 >60 >60  ANIONGAP '6 5 8 '$ 4*    Recent Labs  Lab 08/30/22 1427 08/31/22 0419 09/03/22 1420  PROT 7.6 6.0* 5.9*  ALBUMIN 3.9 2.8* 2.5*  AST 43* 367* 57*  ALT 32 296* 335*  ALKPHOS 84 75 112  BILITOT 0.9 0.6 1.0   Lipids  Recent Labs  Lab 08/31/22 0419  CHOL 137  TRIG 51  HDL 54  LDLCALC 73  CHOLHDL 2.5    Hematology Recent Labs  Lab 09/02/22 0405 09/03/22 0428 09/04/22 0720  WBC 9.9 7.6 10.2  RBC 3.25* 3.08* 3.46*  HGB 10.3* 9.8* 11.0*  HCT 29.9* 28.8* 32.8*  MCV 92.0 93.5 94.8  MCH 31.7 31.8 31.8  MCHC 34.4 34.0 33.5  RDW 20.3* 21.1* 21.3*  PLT 111* 83* 114*   Thyroid  Recent Labs  Lab 09/02/22 0405 09/02/22 1332  TSH 43.170*  --   FREET4  --  0.48*    BNP Recent Labs  Lab 08/30/22 1447  BNP 607.0*    DDimer No results for input(s): "DDIMER" in the last 168 hours.   Radiology/Studies:  US Abdomen Limited RUQ (LIVER/GB)  Result Date: 09/03/2022 CLINICAL DATA:  Biliary obstruction. EXAM: ULTRASOUND ABDOMEN LIMITED RIGHT UPPER QUADRANT COMPARISON:  None Available. FINDINGS: Gallbladder: A 1.3 cm shadowing, echogenic gallstone is seen within the dependent portion of the  gallbladder lumen. The gallbladder wall measures 4.56 mm in thickness. No sonographic Murphy sign noted by sonographer. Common bile duct: Diameter: 3.8 mm Liver: No focal lesion identified. Within normal limits in parenchymal echogenicity. Portal vein is patent on color Doppler imaging with normal direction of blood flow towards the liver. Other: A mild amount of abdominal free fluid is noted. Of incidental note is the presence of a 2.4 cm simple cyst within the upper pole of the right kidney. IMPRESSION: 1. Cholelithiasis and mild gallbladder wall thickening, without evidence of acute cholecystitis. 2. Mild amount of abdominal free fluid which may represent the source of the previously noted gallbladder wall thickening. 3. Simple right renal cyst. No additional follow-up imaging is recommended. Electronically Signed   By: Virgina Norfolk M.D.   On: 09/03/2022 22:28   DG Abd Portable 1V  Result Date: 09/03/2022 CLINICAL DATA:  [id number]. Encounter for nasogastric (NG) tube placement EXAM: PORTABLE ABDOMEN - 1 VIEW COMPARISON:  X-ray abdomen 09/02/2022 FINDINGS: Partially visualized triple lead cardiac pacemaker and defibrillator. Interval advancement of an enteric tube bat courses below the hemidiaphragm with tip overlying the expected region of the second portion of the duodenum. The bowel gas pattern is normal. No radio-opaque  calculi or other significant radiographic abnormality are seen. IMPRESSION: Enteric tube courses below the hemidiaphragm with tip overlying the expected region of the second portion of the duodenum. Electronically Signed   By: Iven Finn M.D.   On: 09/03/2022 19:20   DG Abd Portable 1V  Result Date: 09/02/2022 CLINICAL DATA:  Feeding tube placement. EXAM: PORTABLE ABDOMEN - 1 VIEW COMPARISON:  Radiographs 09/02/2022 and 08/31/2022.  CT 07/16/2022. FINDINGS: 1445 hours. Tip of the feeding tube projects over the mid lumbar spine, consistent with position in the distal stomach. Left  upper quadrant embolization coils and cardiac defibrillator leads are noted. The visualized bowel gas pattern is nonobstructive. IMPRESSION: Tip of the feeding tube projects over the mid abdomen, consistent with position in the distal stomach. Electronically Signed   By: Richardean Sale M.D.   On: 09/02/2022 14:54   DG Abd Portable 1V  Result Date: 09/02/2022 CLINICAL DATA:  Check gastric catheter placed EXAM: PORTABLE ABDOMEN - 1 VIEW COMPARISON:  08/31/2022 FINDINGS: Gastric catheter is noted extending into the third portion of the duodenum. Scattered large and small bowel gas is noted. No free air is noted. IMPRESSION: Gastric catheter deep in the duodenum Electronically Signed   By: Inez Catalina M.D.   On: 09/02/2022 02:33   DG CHEST PORT 1 VIEW  Result Date: 09/01/2022 CLINICAL DATA:  Endotracheal tube placement. EXAM: PORTABLE CHEST 1 VIEW COMPARISON:  09/01/2022. FINDINGS: The heart size and mediastinal contours are stable. Minimal subsegmental atelectasis at the left lung base. No consolidation, effusion, or pneumothorax. A stable multi lead pacemaker device is present over the left chest. An endotracheal tube terminates 6.2 cm above the carina. An enteric tube courses over the midline and out of the field of view. IMPRESSION: 1. Mild subsegmental atelectasis at the left lung base. 2. Support apparatus as described above. Electronically Signed   By: [name] M.D.   On: 09/01/2022 01:36   DG CHEST PORT 1 VIEW  Result Date: 09/01/2022 CLINICAL DATA:  Endotracheal 2 placement EXAM: PORTABLE CHEST 1 VIEW COMPARISON:  09/01/2019 FINDINGS: Support Apparatus: --Endotracheal tube: Tip at the level of the clavicular heads. --Enteric tube:Tip and sideport are below the field of view. --Vascular catheter(s):None --Other: Left chest wall 3 lead AICD The heart size and mediastinal contours are within normal limits. The lungs are clear. No pleural effusion or pneumothorax. IMPRESSION: Endotracheal tube  tip at the level of the clavicular heads. Electronically Signed   By: Ulyses Jarred M.D.   On: 09/01/2022 00:59   CT HEAD WO CONTRAST (5MM)  Result Date: 08/31/2022 CLINICAL DATA:  Acute neurologic deficit EXAM: CT HEAD WITHOUT CONTRAST TECHNIQUE: Contiguous axial images were obtained from the base of the skull through the vertex without intravenous contrast. RADIATION DOSE REDUCTION: This exam was performed according to the departmental dose-optimization program which includes automated exposure control, adjustment of the mA and/or kV according to patient size and/or use of iterative reconstruction technique. COMPARISON:  08/30/2022 FINDINGS: Brain: There is no mass, hemorrhage or extra-axial collection. The size and configuration of the ventricles and extra-axial CSF spaces are normal. There is hypoattenuation of the white matter, most commonly indicating chronic small vessel disease. Partially empty sella. Vascular: No abnormal hyperdensity of the major intracranial arteries or dural venous sinuses. No intracranial atherosclerosis. Skull: The visualized skull base, calvarium and extracranial soft tissues are normal. Sinuses/Orbits: No fluid levels or advanced mucosal thickening of the visualized paranasal sinuses. No mastoid or middle ear effusion. The orbits are normal.  IMPRESSION: Chronic small vessel disease without acute intracranial abnormality. Electronically Signed   By: Ulyses Jarred M.D.   On: 08/31/2022 21:09     Assessment and Plan:   PEA arrest s/p ACLS  - Suspected due to aspiration event following seizure. Now with neurological recovery.  - Echo showed new regional wall motion abnormality.  - Will interrogate ICD  2. CAD 3. Acute on chronic combined CHF/ICM - Patient with know ICM. Last cath 04/2022 with relatively stable findings similar to previous with known occlusion of distal Cx and RCA. There are some moderate upstream lesions in the OM1 and D1, just upstream of the stents.  -  Now echo with new regional WM abnormality suggestive of LAD territory ischemia. EF stable at 35-40%.  - Will repeat limited to echo to assess WM - Continue Plavix - Continue Farxiga - Continue Toprol XL '50mg'$  BID - Continue Entresto 97/'103mg'$  BID  3. PAF - Intermittent afib. Rate controlled.  - Continue amiodarone - Heparin changed to Eliquis this morning. If plan for cath, we need to re switch   4. Hx of Recurrent VT - Continue Amiodarone - Will interrogate device  5. Carotid stenosis - CTA of head and neck with 70% diameter stenosis proximal right internal carotid artery. Diffuse atherosclerotic disease left carotid without significant Stenosis - Continue Plavix and statin  6. Stroke like symptoms/Seizure - per neurology "Will need MRI with and without contrast to evaluate for possible small stroke given that patient initially presented with sudden onset aphasia, stroke is possible and will not be able to rule out until MRI is done".  7. Metastatic colon cancer  Risk Assessment/Risk Scores:  { New York Heart Association (NYHA) Functional Class NYHA Class III  CHA2DS2-VASc Score = 4   This indicates a 4.8% annual risk of stroke. The patient's score is based upon: CHF History: 1 HTN History: 1 Diabetes History: 1 Stroke History: 0 Vascular Disease History: 1 Age Score: 0 Gender Score: 0   Score is 6 if stoke.    For questions or updates, please contact [facility] Please consult [email or web address] for contact info under    SignedLeanor Kail, PA  09/04/2022 1:24 PM    I have seen and examined the patient along with Leanor Kail, PA .  I have reviewed the chart, notes and new data.  I agree with PA/NP's note.  Key new complaints: Right-sided chest pain worsens with deep breath and tender to palpation over the right ribs consistent with post CPR injury.  Does not really have dyspnea, but it hurts to take a deep breath. Key examination changes:  Appears chronically ill.  Lungs are clear.  Rhythm is regular at baseline, with frequent ectopic beats.  ICD site appears healthy. Key new findings / data: I have reviewed the echocardiogram from 08/30/2022, which is a technically difficult study and disagree with the interpretation.  I do not think there is any evidence of new regional wall motion abnormality in the LAD artery distribution.  The study shows inferior wall aneurysm and lateral wall severe hypokinesis, consistent with known right coronary artery occlusion and distal left circumflex coronary artery occlusion.  The EF is unchanged from previous studies of 35-40%.  There is severe left ventricular hypertrophy.  No left ventricular thrombus is seen.  RV function is normal.  Frequent PVCs are seen and his ECGs from admission show brief bursts of 3 beat nonsustained VT with a relatively slow rate, typical for his previous presentation with  inferior wall scar/aneurysm VT.  I have requested interrogation of his ICD to make sure that his "seizure" at presentation was not due to arrhythmia.  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  The system as a whole is MRI conditional.  If he needs an MRI of the brain this can be performed with the appropriate precautions.  Incidentally discovered cholelithiasis without acute cholecystitis.  PLAN: Interrogate ICD.  Continue treatment with amiodarone and mexiletine. Continue treatment for heart failure with depressed left ventricular systolic function (max dose Entresto, metoprolol succinate, Farxiga; there may even be room to add spironolactone).   I do not think there is a compelling reason to proceed to coronary angiography at this time.  Do not anticipate we will find changes from the recent heart catheterization performed in May.  Continue lipid-lowering, antiplatelet and anticoagulant agents for CAD and paroxysmal atrial fibrillation  respectively. He can have an MRI of the brain if necessary.    Sanda Klein, MD, [place] [phone] 09/04/2022, 3:34 PM

## 2022-09-04 NOTE — Progress Notes (Signed)
  Inpatient Rehabilitation Admissions Coordinator   Patient has been admitted to CIR in 6/23 and did well. I contacted his son, by phone to discuss goals and expectations of a possible CIR admit. He prefers CIR for rehab. Family can provide expected caregiver support that is recommended of supervision level. I will begin insurance Auth with Christella Scheuermann for possible CIR admit pending approval. Please call me with any questions.   Danne Baxter, RN, MSN Rehab Admissions Coordinator [phone]

## 2022-09-04 NOTE — Progress Notes (Signed)
[facility] Progress Note Patient Name: Gregory [name] DOB: [birth year] MRN: [id number]   Date of Service  09/04/2022  HPI/Events of Note  KUB and RUQ ultrasound without acute findings.  eICU Interventions  Will resume PO intake and order PRN Oxycodone for pain.        Kerry Kass Dick Hark 09/04/2022, 12:54 AM

## 2022-09-05 ENCOUNTER — Inpatient Hospital Stay (HOSPITAL_COMMUNITY): Payer: Commercial Managed Care - HMO

## 2022-09-05 DIAGNOSIS — I5021 Acute systolic (congestive) heart failure: Secondary | ICD-10-CM | POA: Diagnosis not present

## 2022-09-05 DIAGNOSIS — J690 Pneumonitis due to inhalation of food and vomit: Secondary | ICD-10-CM | POA: Diagnosis not present

## 2022-09-05 DIAGNOSIS — I469 Cardiac arrest, cause unspecified: Secondary | ICD-10-CM | POA: Diagnosis not present

## 2022-09-05 LAB — ECHOCARDIOGRAM LIMITED
S' Lateral: 4.5 cm
Weight: 3594.38 [oz_av]

## 2022-09-05 LAB — CBC
HCT: 34.3 % — ABNORMAL LOW (ref 39.0–52.0)
Hemoglobin: 11.3 g/dL — ABNORMAL LOW (ref 13.0–17.0)
MCH: 31.7 pg (ref 26.0–34.0)
MCHC: 32.9 g/dL (ref 30.0–36.0)
MCV: 96.3 fL (ref 80.0–100.0)
Platelets: 138 K/uL — ABNORMAL LOW (ref 150–400)
RBC: 3.56 MIL/uL — ABNORMAL LOW (ref 4.22–5.81)
RDW: 21.8 % — ABNORMAL HIGH (ref 11.5–15.5)
WBC: 10 K/uL (ref 4.0–10.5)
nRBC: 0.3 % — ABNORMAL HIGH (ref 0.0–0.2)

## 2022-09-05 LAB — COMPREHENSIVE METABOLIC PANEL
ALT: 143 U/L — ABNORMAL HIGH (ref 0–44)
AST: 30 U/L (ref 15–41)
Albumin: 2.8 g/dL — ABNORMAL LOW (ref 3.5–5.0)
Alkaline Phosphatase: 114 U/L (ref 38–126)
Anion gap: 9 (ref 5–15)
BUN: 11 mg/dL (ref 8–23)
CO2: 25 mmol/L (ref 22–32)
Calcium: 8.2 mg/dL — ABNORMAL LOW (ref 8.9–10.3)
Chloride: 107 mmol/L (ref 98–111)
Creatinine, Ser: 0.96 mg/dL (ref 0.61–1.24)
GFR, Estimated: >60 mL/min (ref >60)
Glucose, Bld: 127 mg/dL — ABNORMAL HIGH (ref 70–99)
Potassium: 4 mmol/L (ref 3.5–5.1)
Sodium: 141 mmol/L (ref 135–145)
Total Bilirubin: 0.8 mg/dL (ref 0.3–1.2)
Total Protein: 6.5 g/dL (ref 6.5–8.1)

## 2022-09-05 LAB — BASIC METABOLIC PANEL WITH GFR
Anion gap: 5 (ref 5–15)
BUN: 10 mg/dL (ref 8–23)
CO2: 28 mmol/L (ref 22–32)
Calcium: 8.2 mg/dL — ABNORMAL LOW (ref 8.9–10.3)
Chloride: 109 mmol/L (ref 98–111)
Creatinine, Ser: 0.92 mg/dL (ref 0.61–1.24)
GFR, Estimated: >60 mL/min
Glucose, Bld: 157 mg/dL — ABNORMAL HIGH (ref 70–99)
Potassium: 3.8 mmol/L (ref 3.5–5.1)
Sodium: 142 mmol/L (ref 135–145)

## 2022-09-05 LAB — PHOSPHORUS
Phosphorus: 1.9 mg/dL — ABNORMAL LOW (ref 2.5–4.6)
Phosphorus: 3 mg/dL (ref 2.5–4.6)

## 2022-09-05 LAB — GLUCOSE, CAPILLARY
Glucose-Capillary: 128 mg/dL — ABNORMAL HIGH (ref 70–99)
Glucose-Capillary: 151 mg/dL — ABNORMAL HIGH (ref 70–99)
Glucose-Capillary: 163 mg/dL — ABNORMAL HIGH (ref 70–99)
Glucose-Capillary: 86 mg/dL (ref 70–99)

## 2022-09-05 LAB — MAGNESIUM: Magnesium: 2.2 mg/dL (ref 1.7–2.4)

## 2022-09-05 MED ORDER — POTASSIUM PHOSPHATES 15 MMOLE/5ML IV SOLN
30.0000 mmol | Freq: Once | INTRAVENOUS | Status: AC
  Administered 2022-09-05: 30 mmol via INTRAVENOUS
  Filled 2022-09-05: qty 10

## 2022-09-05 MED ORDER — VITAL 1.5 CAL PO LIQD
1000.0000 mL | ORAL | Status: DC
  Administered 2022-09-05 – 2022-09-07 (×3): 1000 mL
  Filled 2022-09-05 (×6): qty 1000

## 2022-09-05 MED ORDER — HYDRALAZINE HCL 20 MG/ML IJ SOLN: 10.0000 mg | INTRAMUSCULAR | Status: DC | PRN

## 2022-09-05 MED ORDER — LEVETIRACETAM IN NACL 1000 MG/100ML IV SOLN
1000.0000 mg | Freq: Once | INTRAVENOUS | Status: AC
  Administered 2022-09-05: 1000 mg via INTRAVENOUS
  Filled 2022-09-05: qty 100

## 2022-09-05 MED ORDER — POTASSIUM & SODIUM PHOSPHATES 280-160-250 MG PO PACK
2.0000 | PACK | Freq: Three times a day (TID) | ORAL | Status: AC
  Administered 2022-09-06 (×3): 2
  Filled 2022-09-05 (×3): qty 2

## 2022-09-05 MED ORDER — LEVETIRACETAM 500 MG PO TABS
1500.0000 mg | ORAL_TABLET | Freq: Two times a day (BID) | ORAL | Status: DC
  Administered 2022-09-05 – 2022-09-11 (×12): 1500 mg via ORAL
  Filled 2022-09-05 (×12): qty 3

## 2022-09-05 MED ORDER — ZOLPIDEM TARTRATE 5 MG PO TABS
5.0000 mg | ORAL_TABLET | Freq: Every evening | ORAL | Status: DC | PRN
  Administered 2022-09-05 – 2022-09-10 (×6): 5 mg via ORAL
  Filled 2022-09-05 (×6): qty 1

## 2022-09-05 MED ORDER — INSULIN ASPART 100 UNIT/ML IJ SOLN
0.0000 [IU] | Freq: Four times a day (QID) | INTRAMUSCULAR | Status: DC
  Administered 2022-09-06 (×2): 1 [IU] via SUBCUTANEOUS
  Administered 2022-09-06 – 2022-09-07 (×2): 2 [IU] via SUBCUTANEOUS
  Administered 2022-09-07: 1 [IU] via SUBCUTANEOUS
  Administered 2022-09-07: 2 [IU] via SUBCUTANEOUS
  Administered 2022-09-08 (×2): 3 [IU] via SUBCUTANEOUS

## 2022-09-05 MED ORDER — LEVETIRACETAM 500 MG PO TABS
500.0000 mg | ORAL_TABLET | ORAL | Status: DC
  Filled 2022-09-05: qty 1

## 2022-09-05 NOTE — Progress Notes (Signed)
Heart Failure Navigator Progress Note  Following this hospitalization to assess for HV TOC readiness.   EF 35-40% HFrEF ICD PEA arrest Eval for CIR (approval? )  Earnestine Leys, BSN, RN Heart Failure Transport planner Only

## 2022-09-05 NOTE — Progress Notes (Signed)
Inpatient Rehabilitation Admissions Coordinator   I have insurance approval for CIR. I await medical workup completion before pursing admit. Discussed with cardiology PA and Dr Wyline Copas. I met with patient at bedside and he is in agreement .  Danne Baxter, RN, MSN Rehab Admissions Coordinator [phone] 09/05/2022 12:13 PM

## 2022-09-05 NOTE — Evaluation (Addendum)
Speech Language Pathology Evaluation Patient Details Name: Gregory [name] MRN: [id number] DOB: 1961-05-31 Today's Date: 09/05/2022 Time: 2505-3976 SLP Time Calculation (min) (ACUTE ONLY): 13 min  Problem List:  Patient Active Problem List   Diagnosis Date Noted   Protein-calorie malnutrition, severe 09/01/2022   Pressure injury of skin 08/31/2022   Cardiac arrest ([place]) 08/30/2022   Acute respiratory failure with hypoxia ([place])    Status epilepticus ([place])    Postprocedural intraabdominal abscess 05/22/2022   Controlled type 2 diabetes mellitus without complication, without long-term current use of insulin ([place]) 05/08/2022   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  05/08/2022   Ileus following gastrointestinal surgery ([place]) 05/08/2022   Hypothyroidism 05/08/2022   Leukocytosis 05/08/2022   Postoperative ileus ([place]) 04/22/2022   Cancer of right colon ([place]) 04/17/2022   Benign neoplasm of colon    Anticoagulated    Symptomatic anemia/iron deficiency anemia 04/12/2022   ICD (implantable cardioverter-defibrillator) in place 03/21/2022   Slow transit constipation    Lipoma    Sleep disturbance    Hypoalbuminemia due to protein-calorie malnutrition (HCC)    Seizures (HCC)    Paroxysmal atrial fibrillation ([place])    Debility 09/29/2020   Shock ([place]) 09/13/2020   Thrombocytopenia (HCC)    Localization-related idiopathic epilepsy and epileptic syndromes with seizures of localized onset, not intractable, without status epilepticus ([place]) 10/20/2018   Anemia 06/07/2018   Chronic combined systolic and diastolic CHF (congestive heart failure) (HCC)/ischemic cardiomyopathy 12/01/2013   CAD S/P percutaneous coronary angioplasty 12/01/2013   Erectile dysfunction 12/01/2013   Essential hypertension 12/01/2013   Hypercholesterolemia 12/01/2013   OSA on CPAP 12/01/2013   Past Medical History:  Past Medical History:  Diagnosis Date   AICD (automatic  cardioverter/defibrillator) present 2005   CAD (coronary artery disease) 12/01/2013   Chronic combined systolic and diastolic CHF, NYHA class 1 ([place]) 12/01/2013   Erectile dysfunction 12/01/2013   HTN (hypertension) 12/01/2013   Hyperlipidemia 12/01/2013   Ischemic cardiomyopathy 12/01/2013   Presence of permanent cardiac pacemaker    Sleep apnea    Past Surgical History:  Past Surgical History:  Procedure Laterality Date   ACHILLES TENDON REPAIR     lft foot   BIOPSY  04/16/2022   Procedure: BIOPSY;  Surgeon: Yetta Flock, MD;  Location: [facility];  Service: Gastroenterology;;   CARDIAC CATHETERIZATION N/A 05/05/2016   Procedure: Left Heart Cath and Coronary Angiography;  Surgeon: Leonie Man, MD;  Location: [facility];  Service: Cardiovascular;  Laterality: N/A;   CARDIAC DEFIBRILLATOR PLACEMENT     CARDIOVERSION N/A 09/13/2020   Procedure: CARDIOVERSION;  Surgeon: Werner Lean, MD;  Location: [facility];  Service: Cardiovascular;  Laterality: N/A;   CARDIOVERSION N/A 09/19/2020   Procedure: CARDIOVERSION;  Surgeon: [name], MD;  Location: [facility];  Service: Cardiovascular;  Laterality: N/A;   COLON RESECTION N/A 04/18/2022   Procedure: HAND ASSISTED LAPAROSCOPIC RIGHT COLON RESECTION;  Surgeon: Felicie Morn, MD;  Location: [facility];  Service: General;  Laterality: N/A;   COLONOSCOPY WITH PROPOFOL N/A 04/16/2022   Procedure: COLONOSCOPY WITH PROPOFOL;  Surgeon: Yetta Flock, MD;  Location: [facility];  Service: Gastroenterology;  Laterality: N/A;   EP IMPLANTABLE DEVICE N/A 12/19/2016   Procedure: ICD Generator Changeout;  Surgeon: [name], MD;  Location: [facility];  Service: Cardiovascular;  Laterality: N/A;   ESOPHAGOGASTRODUODENOSCOPY N/A 04/16/2022   Procedure: ESOPHAGOGASTRODUODENOSCOPY (EGD);  Surgeon: Yetta Flock, MD;  Location: [facility];  Service: Gastroenterology;  Laterality: N/A;   IR  ANGIOGRAM SELECTIVE EACH ADDITIONAL VESSEL  05/09/2022   IR ANGIOGRAM VISCERAL SELECTIVE  05/09/2022   IR CATHETER TUBE CHANGE  05/14/2022   IR CATHETER TUBE CHANGE  06/27/2022   IR EMBO ART  VEN HEMORR LYMPH EXTRAV  INC GUIDE ROADMAPPING  05/09/2022   IR IMAGE GUIDED DRAINAGE PERCUT CATH  PERITONEAL RETROPERIT  05/09/2022   IR PATIENT EVAL TECH 0-60 MINS  06/11/2022   IR RADIOLOGIST EVAL & MGMT  06/26/2022   IR RADIOLOGIST EVAL & MGMT  07/16/2022   IR US GUIDE VASC ACCESS RIGHT  05/09/2022   LEFT HEART CATH AND CORONARY ANGIOGRAPHY N/A 04/17/2022   Procedure: LEFT HEART CATH AND CORONARY ANGIOGRAPHY;  Surgeon: Belva Crome, MD;  Location: [facility];  Service: Cardiovascular;  Laterality: N/A;   POLYPECTOMY  04/16/2022   Procedure: POLYPECTOMY;  Surgeon: Yetta Flock, MD;  Location: [facility] ENDOSCOPY;  Service: Gastroenterology;;   RIGHT/LEFT HEART CATH AND CORONARY ANGIOGRAPHY N/A 09/24/2020   Procedure: RIGHT/LEFT HEART CATH AND CORONARY ANGIOGRAPHY;  Surgeon: Lorretta Harp, MD;  Location: [facility];  Service: Cardiovascular;  Laterality: N/A;   SUBMUCOSAL TATTOO INJECTION  04/16/2022   Procedure: SUBMUCOSAL TATTOO INJECTION;  Surgeon: Yetta Flock, MD;  Location: [facility];  Service: Gastroenterology;;   WRIST TENODESIS     HPI:  Pt is a 61 y.o. male admitted 08/30/22 with AMS as code stroke. Pt decompensated in ED requiring intubation, brief PEA arrest in CT requiring 1x round CPR. Initial head CT with questionable dense L MCA, but CTA revealed no LVO. EEG 9/17 with encephalopathy, no seizure activity. Chest CTA with small pleural effusion, rib fx in setting of CPR. ETT 9/16-9/20. PMH includes metastatic colon CA, HFrEF, VT s/p ICD, afib, HTN, DM, CAD, seizure disorder, recent intraabdominal abscess s/p drain.  Pt was intubated from 9/16-9/20.   Assessment / Plan / Recommendation Clinical Impression  Pt was seen for a cognitive-linguistic evaluation and he presents  with mild-moderate cognitive deficits in the areas of attention, short-term memory, and problem solving.  Pt reported that he lives at home with his son who assists him with ADLs and IADLs at baseline. No family was present during this evaluation to help determine cognitive baseline.   Expressive and receptive language were functional and no dysarthria was observed.  Recommend additional ST targeting cognitive deficits and assistance with IADLs at time of discharge.      SLP Assessment  SLP Recommendation/Assessment: Patient needs continued Speech [place] SLP Visit Diagnosis: Cognitive communication deficit (R41.841)    Recommendations for follow up therapy are one component of a multi-disciplinary discharge planning process, led by the attending physician.  Recommendations may be updated based on patient status, additional functional criteria and insurance authorization.    Follow Up Recommendations  Acute inpatient rehab (3hours/day)    Assistance Recommended at Discharge  Intermittent Supervision/Assistance  Functional Status Assessment Patient has had a recent decline in their functional status and demonstrates the ability to make significant improvements in function in a reasonable and predictable amount of time.  Frequency and Duration min 2x/week  2 weeks      SLP Evaluation Cognition  Overall Cognitive Status: Impaired/Different from baseline Arousal/Alertness: Awake/alert Orientation Level: Oriented X4 Attention: Sustained Sustained Attention: Impaired Memory: Impaired Memory Impairment: Decreased short term memory Decreased Short Term Memory: Verbal basic Awareness: Appears intact Problem Solving: Impaired Problem Solving Impairment: Verbal complex Safety/Judgment: Appears intact  Comprehension  Auditory Comprehension Overall Auditory Comprehension: Appears within functional limits for tasks assessed    Expression Expression Primary Mode of  Expression: Verbal Verbal Expression Overall Verbal Expression: Appears within functional limits for tasks assessed Written Expression Dominant Hand: Right   Oral / Motor  Oral Motor/Sensory Function Overall Oral Motor/Sensory Function: Within functional limits Motor Speech Overall Motor Speech: Appears within functional limits for tasks assessed           Bretta Bang, M.S., [place] Office: [phone]  [place] 09/05/2022, 10:48 AM

## 2022-09-05 NOTE — Progress Notes (Signed)
Inpatient Rehabilitation Admissions Coordinator   I clarified with patient's son by phone that his primary insurance is workers Fish farm manager # [id number] and his secondary insurance is Garment/textile technologist. I will notify the preservice center so that I can obtain insurance approval with workers comp for a possible CIR admit early next week.  Danne Baxter, RN, MSN Rehab Admissions Coordinator [phone] 09/05/2022 2:58 PM

## 2022-09-05 NOTE — Hospital Course (Signed)
61yo who presented  after seizure and brief PEA arrest (known seizure disorder). Weaned extubated with full neurological recovery. HFrEF with ICD, restarted on HF meds, not decompensated. Cardiology was consulted and following

## 2022-09-05 NOTE — Progress Notes (Addendum)
Rounding Note    Patient Name: Gregory [name] Date of Encounter: 09/05/2022  [place] Cardiologist: Sanda Klein, MD   Subjective   No acute overnight events. Patient states he is not feeling well this morning and is having pain in his right ribs. Sounds like this is from CPR. No chest pain. No shortness of breath or palpitations. Currently have Echo done.  Inpatient Medications    Scheduled Meds:  amiodarone  400 mg Oral Daily   apixaban  5 mg Oral BID   atorvastatin  80 mg Oral QPM   clopidogrel  75 mg Oral Daily   dapagliflozin propanediol  10 mg Oral Daily   diclofenac Sodium  2 g Topical QID   ezetimibe  10 mg Oral Daily   feeding supplement  237 mL Oral TID BM   feeding supplement (VITAL 1.5 CAL)  560 mL Per Tube Q24H   furosemide  40 mg Oral Daily   insulin aspart  0-9 Units Subcutaneous Q6H   levETIRAcetam  1,000 mg Oral BID   levothyroxine  50 mcg Oral Q0600   lidocaine  2 patch Transdermal Q24H   megestrol  40 mg Oral Daily   metoprolol succinate  50 mg Oral BID   multivitamin with minerals  1 tablet Oral Daily   pantoprazole  40 mg Oral Daily   [START ON 09/06/2022] potassium & sodium phosphates  2 packet Per Tube TID   potassium chloride  10 mEq Oral Daily   sacubitril-valsartan  1 tablet Oral BID   thiamine  100 mg Oral Daily   traZODone  100 mg Oral QHS   Continuous Infusions:  potassium PHOSPHATE IVPB (in mmol) 30 mmol (09/05/22 1055)   PRN Meds: acetaminophen, docusate sodium, LORazepam, ondansetron (ZOFRAN) IV, oxyCODONE, polyethylene glycol, sodium chloride flush   Vital Signs    Vitals:   09/04/22 2247 09/04/22 2247 09/05/22 0553 09/05/22 0940  BP: 135/84 135/84 129/86 128/83  Pulse: 85 85 73 73  Resp: '18 18 18 19  '$ Temp: 98.6 F (37 C) 98.6 F (37 C) 98.3 F (36.8 C) 97.9 F (36.6 C)  TempSrc: Oral Oral  Oral  SpO2: 95% 92% 95% 95%  Weight:        Intake/Output Summary (Last 24 hours) at 09/05/2022 1125 Last  data filed at 09/05/2022 0600 Gross per 24 hour  Intake 1137.82 ml  Output 2150 ml  Net -1012.18 ml      09/04/2022    5:00 AM 09/03/2022    4:57 AM 09/02/2022    5:00 AM  Last 3 Weights  Weight (lbs) 224 lb 10.4 oz 216 lb 4.3 oz 215 lb 9.8 oz  Weight (kg) 101.9 kg 98.1 kg 97.8 kg      Telemetry    Normal sinus rhythm with rates in the 70s. Some PVCs.  - Personally Reviewed  ECG    No new ECG tracing today. - Personally Reviewed  Physical Exam   GEN: Thin, frail African-American male. No acute distress.   Neck: No JVD Cardiac: RRR. No murmurs, rubs, or gallops. Tenderness with palpation over right ribs. Respiratory: Clear to auscultation anteriorly. GI: Soft, non-distended, and non-tender. MS: No lower extremity edema. No deformity. Neuro:  No focal deficits. Psych: Normal affect. Responds appropriately.  Labs    High Sensitivity Troponin:   Recent Labs  Lab 08/30/22 1427 08/30/22 1845  TROPONINIHS 46* 90*     Chemistry Recent Labs  Lab 08/30/22 1427 08/30/22 1752 08/31/22 0419 09/01/22  0501 09/02/22 0405 09/02/22 1939 09/03/22 0428 09/03/22 1420 09/04/22 0720 09/05/22 0200  NA 137   < > 132*   < > 137 139 140  --  139 142  K 4.0   < > 3.8   < > 3.1* 3.9 4.0  --  3.6 3.8  CL 96*   < > 99   < > 106 110 108  --  108 109  CO2 26   < > 21*   < > '25 24 24  '$ --  27 28  GLUCOSE 141*   < > 144*   < > 177* 170* 145*  --  146* 157*  BUN 15   < > 20   < > '16 13 13  '$ --  9 10  CREATININE 1.20   < > 1.15   < > 0.90 0.77 0.79  --  0.97 0.92  CALCIUM 9.3   < > 8.0*   < > 7.7* 7.4* 8.0*  --  8.1* 8.2*  MG 1.7  --   --    < > 2.0 2.1  --   --  2.0  --   PROT 7.6  --  6.0*  --   --   --   --  5.9*  --   --   ALBUMIN 3.9  --  2.8*  --   --   --   --  2.5*  --   --   AST 43*  --  367*  --   --   --   --  57*  --   --   ALT 32  --  296*  --   --   --   --  335*  --   --   ALKPHOS 84  --  75  --   --   --   --  112  --   --   BILITOT 0.9  --  0.6  --   --   --   --  1.0  --    --   GFRNONAA >60   < > >60   < > >60 >60 >60  --  >60 >60  ANIONGAP 15   < > 12   < > '6 5 8  '$ --  4* 5   < > = values in this interval not displayed.    Lipids  Recent Labs  Lab 08/31/22 0419  CHOL 137  TRIG 51  HDL 54  LDLCALC 73  CHOLHDL 2.5    Hematology Recent Labs  Lab 09/03/22 0428 09/04/22 0720 09/05/22 0200  WBC 7.6 10.2 10.0  RBC 3.08* 3.46* 3.56*  HGB 9.8* 11.0* 11.3*  HCT 28.8* 32.8* 34.3*  MCV 93.5 94.8 96.3  MCH 31.8 31.8 31.7  MCHC 34.0 33.5 32.9  RDW 21.1* 21.3* 21.8*  PLT 83* 114* 138*   Thyroid  Recent Labs  Lab 09/02/22 0405 09/02/22 1332  TSH 43.170*  --   FREET4  --  0.48*    BNP Recent Labs  Lab 08/30/22 1447  BNP 607.0*    DDimer No results for input(s): "DDIMER" in the last 168 hours.   Radiology    US Abdomen Limited RUQ (LIVER/GB)  Result Date: 09/03/2022 CLINICAL DATA:  Biliary obstruction. EXAM: ULTRASOUND ABDOMEN LIMITED RIGHT UPPER QUADRANT COMPARISON:  None Available. FINDINGS: Gallbladder: A 1.3 cm shadowing, echogenic gallstone is seen within the dependent portion of the gallbladder lumen. The gallbladder wall measures 4.56 mm in thickness. No  sonographic Percell Miller sign noted by sonographer. Common bile duct: Diameter: 3.8 mm Liver: No focal lesion identified. Within normal limits in parenchymal echogenicity. Portal vein is patent on color Doppler imaging with normal direction of blood flow towards the liver. Other: A mild amount of abdominal free fluid is noted. Of incidental note is the presence of a 2.4 cm simple cyst within the upper pole of the right kidney. IMPRESSION: 1. Cholelithiasis and mild gallbladder wall thickening, without evidence of acute cholecystitis. 2. Mild amount of abdominal free fluid which may represent the source of the previously noted gallbladder wall thickening. 3. Simple right renal cyst. No additional follow-up imaging is recommended. Electronically Signed   By: Virgina Norfolk M.D.   On: 09/03/2022  22:28   DG Abd Portable 1V  Result Date: 09/03/2022 CLINICAL DATA:  [id number]. Encounter for nasogastric (NG) tube placement EXAM: PORTABLE ABDOMEN - 1 VIEW COMPARISON:  X-ray abdomen 09/02/2022 FINDINGS: Partially visualized triple lead cardiac pacemaker and defibrillator. Interval advancement of an enteric tube bat courses below the hemidiaphragm with tip overlying the expected region of the second portion of the duodenum. The bowel gas pattern is normal. No radio-opaque calculi or other significant radiographic abnormality are seen. IMPRESSION: Enteric tube courses below the hemidiaphragm with tip overlying the expected region of the second portion of the duodenum. Electronically Signed   By: Iven Finn M.D.   On: 09/03/2022 19:20    Cardiac Studies   Limited Echo 08/30/2022: Impressions:  1. Limited echo for LVEF   2. Left ventricular ejection fraction, by estimation, is 35 to 40%. The  left ventricle has moderately decreased function. The left ventricle  demonstrates regional wall motion abnormalities (see scoring  diagram/findings for description). There is severe  left ventricular hypertrophy. There is severe hypokinesis of the left  ventricular, entire anteroseptal wall, anterior wall and apical segment.   3. Right ventricular systolic function is mildly reduced. The right  ventricular size is normal.   4. The tricuspid valve is abnormal. Tricuspid valve regurgitation is mild  to moderate.   Comparison(s): Prior images unable to be directly viewed, comparison made  by report only. Changes from prior study are noted. 04/07/2022: LVEF  35-40%, inferior and lateral hypokinesis.   Patient Profile     61 y.o. male with a history of CAD with known CTO of RCA and s/p multiple PCIs (stenting to LAD, OM1, 1st Diag), ischemic cardiomyopathy/ chronic combined CHF with EF of 35-40% on Echo in 03/2022, recurrent VT s/p ICD on Amiodarone, paroxysmal atrial fibrillation on Eliquis, hypertension,  hyperlipidemia, obstructive sleep apnea, colon cancer with liver metastasis s/p right hemicolectomy, and intra-abdominal abscess s/p drain who was admitted on 08/30/2022 with acute onset aphasia and seizures. CT showed possible left MCA stroke. He decompensated after CT in the ED requiring intubation followed by PEA arrest. ROSC was achieved after 5 minutes of ACLS Chest CTA was negative for PE but showed features consistent with aspiration pneumonia leading to hypoxia. Cardiology was consulted for further evaluation of PEA arrest.  Assessment & Plan    PEA Arrest Suspected due to aspiration event following seizure. Chest CTA negative for PE. ROSC was achieved after 5 minutes of ACLS. Now with neurologic recovery. Initial Echo on 9/16 after arrest showed LVEF of 35-40% (unchanged from 03/2022) but with severe hypokinesis of the entire anteroseptal wall, anterior wall, and apical segment. Prior Echo in 03/2022 showed hypokinesis of lateral and inferior walls. - Repeat limited Echo being done this morning to reassess  wall motion abnormalities. - ICD interrogated yesterday. I think MD was called with results. Will discuss with MD and confirm.  CAD Patient has long history of CAD with known CTO of RCA and prior stents to LAD, LCX, and 1 Diag. Last LHC in 04/2022 showed stable disease with known CTO of RCA and PDA (PDA fills via left to right collaterals), known CTO of LCX (with left to right collaterals), patents stents in the LCX and 1st Diag, and non-obstructive disease of the LAD. Echo earlier this admission after PEA arrest showed stable EF of 35-40% with new anterior regional wall motion abnormalities.  - He has some right sided rib pain from CPR but no other chest pain. He has bilateral rib fractures from CPR. - Continue Plavix '75mg'$  daily. Not on aspirin due to need for Eliquis. - Continue beta-blocker and high-intensity statin/Zetia.  Ischemic Cardiomyopathy Chronic Combined CHF BNP 607. CTA  consistent with aspiration but also showed small to moderate left pleural effusion. Echo this admission showed stable EF of 35-40% with new wall motion abnormalities.  - Euvolemic on exam. - Continue PO Lasix '40mg'$  daily. - Continue Entresto 97-'103mg'$  twice daily. - Continue Toprol-XL '50mg'$  twice daily. - Continue Farxiga '10mg'$  daily. - Continue to monitor volume status closely.  Paroxysmal Atrial Fibrillation Intermittently in atrial fibrillation this admission but currently in sinus rhythm. - Continue Toprol-XL '50mg'$  twice daily. - Continue Amiodarone '400mg'$  daily. - Continue chronic anticoagulation with Eliquis '5mg'$  twice daily.  History of Recurrent VT s/p ICD Occasional PVCs but not VT noted on telemetry. - ICD was interrogated yesterday. Will follow-up with MD on what this showed.  Carotid Stenosis Head/neck CTA this admission showed 70% stenosis of proximal right ICA and diffuse atherosclerotic disease of left carotid. - Continue antiplatelet and statin/ Zetia therapy.  Hypertension BP elevated at times throughout admission but well controlled today. - Continue medications for CHF as above. - If any additional agents are needed, would add Spironolactone.  Hyperlipidemia - Continue Lipitor '80mg'$  daily and Zetia '10mg'$  daily.  Otherwise, per primary team: - Stroke like symptoms - Seizure - Colon cancer with metastasis to liver - Obstructive sleep apnea   For questions or updates, please contact [place] Please consult [email or web address] for contact info under        Signed, Darreld Mclean, PA-C  09/05/2022, 11:25 AM    I have seen and examined the patient along with Darreld Mclean, PA-C .  I have reviewed the chart, notes and new data.  I agree with PA/NP's note.  Key new complaints: Denies dyspnea or angina, but still sore in his right chest from CPR.  Generally does not feel well. Key examination changes: Dullness to percussion and reduced breath sounds in  the left lung base.  Regular rate and rhythm, no murmurs, no rubs Key new findings / data: Reviewed echocardiogram from today.  This shows the previously described pattern of akinesis/dyskinesis in the inferior and inferolateral wall, consistent with old infarction in the distribution of the right coronary artery and left circumflex coronary artery.  There is no wall motion abnormality in the distribution of the LAD artery.  EF is unchanged at 35-40%.  Reviewed his ICD download which does not show any meaningful recent episodes of VT, either sustained or nonsustained.  Very brief nonsustained VT lasting maximum 6 beats was seen on the day of his admission and was more likely a consequence of the illness than a cause for his loss of consciousness.  Since admission, telemetry shows occasional PVCs and rare ventricular couplets.  Single 3 beat episode of nonsustained VT was seen yesterday.  PLAN: Continue treatment for CAD/CHF/VT with his home medications. There is no indication of any acute ischemic injury or any indirect indication of ischemia in the LAD artery distribution. All the components of his ICD system (generator, atrial lead, ventricular lead) are MRI conditional.  The generator and ventricular lead belong to different manufacturers ([employer] and [employer] respectively), but I do not think that should preclude brain MRI if this is important.  No further recommendations at this time.  [place] will sign off.   Medication Recommendations: Continue home cardiac prescriptions. Other recommendations (labs, testing, etc): No additional testing planned. Follow up as an outpatient: Already has EP appointment scheduled for October 9 and general cardiology appointment for October 11.   Sanda Klein, MD, [place] [phone] 09/05/2022, 3:34 PM

## 2022-09-05 NOTE — Progress Notes (Signed)
Physical Therapy Treatment Patient Details Name: Gregory [name] MRN: [id number] DOB: 1961-03-09 Today's Date: 09/05/2022   History of Present Illness Pt is a 61 y.o. male admitted 08/30/22 with AMS as code stroke. Pt decompensated in ED requiring intubation, brief PEA arrest in CT requiring 1x round CPR. Initial head CT with questionable dense L MCA, but CTA revealed no LVO. EEG 9/17 with encephalopathy, no seizure activity. Chest CTA with small pleural effusion, rib fx in setting of CPR. ETT 9/16-9/20. PMH includes metastatic colon CA, HFrEF, VT s/p ICD, afib, HTN, DM, CAD, seizure disorder, recent intraabdominal abscess s/p drain.    PT Comments    Patient not progressing well towards PT goals today reporting increased pain in ribs on right side. Agreeable to therapy session with OT to focus on gait progression and transfers. Requires more assist today- Max A for bed mobility and Min-Mod A of 2 for standing from different surface heights. Tolerated short distance ambulation with mod A of 2 but fatigues quickly. Pt with episode of staring off into space with repetitive jaw smacking for 20 seconds at end of session, not responding during this time. Sp02 dropped to 80% on RA, donned supplemental 02,. BP 145/100. RN notified. When pt came too, reports he missed the [place] game last night (they did not play) and more lethargic. Continues to have cognitive deficits relating to safety, awareness, orientation, problem solving, following commands and attention. Will continue to follow.    Recommendations for follow up therapy are one component of a multi-disciplinary discharge planning process, led by the attending physician.  Recommendations may be updated based on patient status, additional functional criteria and insurance authorization.  Follow Up Recommendations  Acute inpatient rehab (3hours/day)     Assistance Recommended at Discharge Frequent or constant Supervision/Assistance   Patient can return home with the following Assistance with cooking/housework;Direct supervision/assist for medications management;Direct supervision/assist for financial management;Assist for transportation;Help with stairs or ramp for entrance;Two people to help with walking and/or transfers;A lot of help with bathing/dressing/bathroom   Equipment Recommendations  Other (comment) (defer to AIR)    Recommendations for Other Services       Precautions / Restrictions Precautions Precautions: Fall;Other (comment) Precaution Comments: cortrak, Rt rib pain Restrictions Weight Bearing Restrictions: No     Mobility  Bed Mobility Overal bed mobility: Needs Assistance Bed Mobility: Rolling, Sidelying to Sit Rolling: Mod assist Sidelying to sit: Max assist, +2 for physical assistance, HOB elevated   Sit to supine: Mod assist, +2 for physical assistance   General bed mobility comments: Step by step cues for sequencing, assist to roll and elevate trunk to get to EOB. Assist to bring LEs into bed to return to supine.    Transfers Overall transfer level: Needs assistance Equipment used: Rolling walker (2 wheels), 2 person hand held assist Transfers: Sit to/from Stand, Bed to chair/wheelchair/BSC Sit to Stand: Mod assist, Min assist, +2 physical assistance, From elevated surface Stand pivot transfers: Max assist, +2 physical assistance         General transfer comment: Min A to power to stand from elevated bed height, Mod A of 2 from low recliner with cues for hand placement. SPT chair to bed with max A of 2 with assist for weight shifting, LE advancement and balance. Heavy left lateral lean.    Ambulation/Gait Ambulation/Gait assistance: Mod assist, +2 physical assistance Gait Distance (Feet): 12 Feet Assistive device: Rolling walker (2 wheels) Gait Pattern/deviations: Step-through pattern, Decreased stride length, Trunk flexed, Step-to pattern,  Leaning posteriorly Gait velocity:  decreased Gait velocity interpretation: <1.31 ft/sec, indicative of household ambulator   General Gait Details: Slow, unsteady gait with decreased step lengths bilaterally with flexed posture with posterior lean through hips and left lateral lean away from rib pain, assist with RW management esp with turns and proximity to RW. Right knee instability. Fatigues after 12' unable to make it back to bed, 1 seated rest break in chair.   Stairs             Wheelchair Mobility    Modified Rankin (Stroke Patients Only)       Balance Overall balance assessment: Needs assistance Sitting-balance support: No upper extremity supported, Feet supported Sitting balance-Leahy Scale: Fair Sitting balance - Comments: min guard-Min A   Standing balance support: During functional activity, Reliant on assistive device for balance, Bilateral upper extremity supported Standing balance-Leahy Scale: Poor Standing balance comment: relies on BUE and external support with posterior/left lateral lean away from rib pain                            Cognition Arousal/Alertness: Awake/alert Behavior During Therapy: Flat affect Overall Cognitive Status: Impaired/Different from baseline Area of Impairment: Following commands, Safety/judgement, Orientation, Attention, Memory, Problem solving, Awareness                 Orientation Level: Disoriented to, Time Current Attention Level: Sustained Memory: Decreased short-term memory Following Commands: Follows one step commands with increased time, Follows one step commands inconsistently Safety/Judgement: Decreased awareness of safety, Decreased awareness of deficits Awareness: Intellectual Problem Solving: Slow processing, Difficulty sequencing, Requires verbal cues, Decreased initiation, Requires tactile cues General Comments: Pt laughing to mask cognitive deficits/questions throughout session, "this is trickery" when told the proposed plan for  therapy. Thinks it is October and unable to state September despite contextual cues. Needs max encouragement, Repetition needed due to decreased initiation and slow processing. Episode of staring off into space with repetitive jaw smacking for 20 seconds at end of session, not responding during this time. RN notified.        Exercises      General Comments General comments (skin integrity, edema, etc.): Pt with episode of staring off into space with repetitive jaw smacking for 20 seconds at end of session, not responding during this time. Sp02 dropped to 80% on RA, donned supplemental 02,. BP 145/100. RN notified. When pt came too, reports he missed the [place] game last night (they did not play) and more lethargic.      Pertinent Vitals/Pain Pain Assessment Pain Assessment: Faces Faces Pain Scale: Hurts whole lot Pain Location: right lower chest, rib pain Pain Descriptors / Indicators: Aching, Guarding, Grimacing, Constant, Discomfort Pain Intervention(s): Monitored during session, Patient requesting pain meds-RN notified, Limited activity within patient's tolerance, Repositioned    Home Living                          Prior Function            PT Goals (current goals can now be found in the care plan section) Progress towards PT goals: Not progressing toward goals - comment (due to pain/fatigue/episode of staring off into space)    Frequency    Min 4X/week      PT Plan Current plan remains appropriate    Co-evaluation PT/OT/SLP Co-Evaluation/Treatment: Yes Reason for Co-Treatment: Complexity of the patient's impairments (multi-system involvement);For patient/therapist safety;To address  functional/ADL transfers PT goals addressed during session: Mobility/safety with mobility;Balance        AM-PAC PT "6 Clicks" Mobility   Outcome Measure  Help needed turning from your back to your side while in a flat bed without using bedrails?: A Lot Help needed moving  from lying on your back to sitting on the side of a flat bed without using bedrails?: Total Help needed moving to and from a bed to a chair (including a wheelchair)?: Total Help needed standing up from a chair using your arms (e.g., wheelchair or bedside chair)?: Total Help needed to walk in hospital room?: Total Help needed climbing 3-5 steps with a railing? : Total 6 Click Score: 7    End of Session Equipment Utilized During Treatment: Gait belt Activity Tolerance: Other (comment);Patient limited by fatigue (episode of staring off into space wtih repetitive jaw smacking/movements, not responding) Patient left: in bed;with call bell/phone within reach;with bed alarm set Nurse Communication: Mobility status PT Visit Diagnosis: Other abnormalities of gait and mobility (R26.89);Difficulty in walking, not elsewhere classified (R26.2);Other symptoms and signs involving the nervous system (R29.898)     Time: 1610-9604 PT Time Calculation (min) (ACUTE ONLY): 35 min  Charges:  $Gait Training: 8-22 mins                     Marisa Severin, PT, DPT Acute Rehabilitation Services Secure chat preferred Office [place] 09/05/2022, 3:06 PM

## 2022-09-05 NOTE — Progress Notes (Signed)
  Progress Note   Patient: Gregory [name] [id number]:[id number] DOB: 11/17/1961 DOA: 08/30/2022     6 DOS: the patient was seen and examined on 09/05/2022   Brief hospital course: 61yo who presented  after seizure and brief PEA arrest (known seizure disorder). Weaned extubated with full neurological recovery. HFrEF with ICD, restarted on HF meds, not decompensated. Cardiology was consulted and following  Assessment and Plan: PEA cardiac arrest -likely occurred following aspiration event following seizure -seems stable -Cardiology following. Discussed with Cardiology who reviewed updated echo, no significant change -Cardiology recs to cont treatment for CAD/CHF/VT with home meds  Seizures -Last seen by Neuro 9/20 with recs to continue keppra 1.5g bid at which point Neuro had signed off. Keppra dose remained at 1g bid, however for unclear reason -Care was transferred to [facility] on 9/22. This afternoon, noted to have seizure lasting around 20 sec. -Made changes as previously recommended by Neuro above. Also discussed with Neurology who recommended giving 1g IV keppra load x 1. Ordered  Aspiration PNA -completed course of abx  Severe protein calorie malnutrition -Cont PO and supplemental tube feeding as tolerated -Dietitian recs noted. Recs to transition to 24hr continuous feedings until electrolytes stabilize and po intake further improves  HFrEF, recurrent VT, Afib on eliquis -Seen by Cardiology who has signed off. As per above  Metastatic colon cancer -continue analgesia as needed -on Xeloda as outpt      Subjective: Hoping to have NG removed soon  Physical Exam: Vitals:   09/04/22 2247 09/05/22 0553 09/05/22 0940 09/05/22 1626  BP: 135/84 129/86 128/83 (!) 158/110  Pulse: 85 73 73 73  Resp: '18 18 19 20  '$ Temp: 98.6 F (37 C) 98.3 F (36.8 C) 97.9 F (36.6 C)   TempSrc: Oral  Oral   SpO2: 92% 95% 95% 97%  Weight:       General exam: Awake, laying in bed, in  nad Respiratory system: Normal respiratory effort, no wheezing Cardiovascular system: regular rate, s1, s2 Gastrointestinal system: Soft, nondistended, positive BS Central nervous system: CN2-12 grossly intact, strength intact Extremities: Perfused, no clubbing Skin: Normal skin turgor, no notable skin lesions seen Psychiatry: Mood normal // no visual hallucinations   Data Reviewed:  Labs reviewed: Na 142, K 3.8, Cr 0.92, Hgb 11.3  Family Communication: Pt in room, family not at bedside  Disposition: Status is: Inpatient Remains inpatient appropriate because: Severity of illness  Planned Discharge Destination: Rehab    Author: Marylu Lund, MD 09/05/2022 5:03 PM  For on call review [email or web address].

## 2022-09-05 NOTE — Progress Notes (Signed)
Nutrition Follow-up  DOCUMENTATION CODES:   Severe malnutrition in context of chronic illness  INTERVENTION:   Tube Feeding via Cortrak (post-pyloric): transition back to 24-hour continuous feedings until electrolytes stabilize and po intake improving Vital 1.5 at 60 ml/hr Resume at 40 ml/hr, titrate by 10 mL q 8 hours until goal rate of 60 ml/hr This provides 2160 kcals, 97 g of protein and 1094 mL of free water.   Ensure Enlive po TID, each supplement provides 350 kcal and 20 grams of protein.  Continue to monitor electrolytes closely until stable  Consider adjusting bowel regimen given no documented BM since admission  NUTRITION DIAGNOSIS:   Severe Malnutrition related to chronic illness (cancer) as evidenced by severe fat depletion, severe muscle depletion.  Being addressed via diet, supplements, TF   GOAL:   Patient will meet greater than or equal to 90% of their needs  Progressing  MONITOR:   Vent status, TF tolerance, Weight trends, I & O's, Labs  REASON FOR ASSESSMENT:   Consult Enteral/tube feeding initiation and management  ASSESSMENT:   Pt with hx of metastatic colon cancer, CHF, CAD, DM type 2, chronic pancreatitis, HTN, HLD, and seizure disorder brought to ED after exhibiting acute strokelike symptoms. After arriving to ED, taken for emergent CT but found to be profoundly hypoxic and was emergently intubated. After intubation, had a PEA arrest with ROSC ~5 minutes.  9/19  Cortrak placed, tip gastric 9/21 Repeat abd xray showed Cortrak tip in duodenum  Pt looks more awake and engaged on visit today. Pt reports he is eating, reports he ate eggs and bacon. Pt reports he did not receive a meal tray but  upon further investigation, nursing staff reveals that pt received a lunch tray but he did not want to eat anything.   TF changed to nocturnal TF yesterday at 40 ml/hr over 14 hours. This provide only 840 kcals, 38 g of protein. Discussed with Attending MD  today and plan to transition back to continuous 24 hour feedings to optimize nutritional risk.   Pt declines any abdominal pain, abdominal discomfort or fullness, N/V/D.   Phosphorus trended back down again tp 1.9; discussed with MD Wyline Copas and plan to supplement and continue to monitor given possible refeeding.   No documented BM since amdmission  Labs: phosphorus 1.9 (L), potassium wdl (but receiving potassium daily) Meds: MVI, megace, KCl. Thiamine, ss novolog, lasix  Diet Order:   Diet Order             Diet regular Room service appropriate? Yes; Fluid consistency: Thin; Fluid restriction: 1800 mL Fluid  Diet effective now                   EDUCATION NEEDS:   Not appropriate for education at this time  Skin:  Skin Assessment: Skin Integrity Issues: Skin Integrity Issues:: Stage I Stage I: sacrum  Last BM:  no BM  Height:   Ht Readings from Last 1 Encounters:  08/29/22 '6\' 6"'$  (1.981 m)    Weight:   Wt Readings from Last 1 Encounters:  09/04/22 101.9 kg    Ideal Body Weight:  97.3 kg  BMI:  Body mass index is 25.96 kg/m.  Estimated Nutritional Needs:   Kcal:  2500-2700 kcal/d  Protein:  125-140g/d  Fluid:  2.5L/d   Kerman Passey MS, RDN, LDN, CNSC Registered Dietitian 3 Clinical Nutrition RD Pager and On-Call Pager Number Located in [place]

## 2022-09-05 NOTE — Progress Notes (Signed)
Occupational Therapy Treatment Patient Details Name: Gregory [name] MRN: [id number] DOB: [birth year] Today's Date: 09/05/2022   History of present illness Pt is a 61 y.o. male admitted 08/30/22 with AMS as code stroke. Pt decompensated in ED requiring intubation, brief PEA arrest in CT requiring 1x round CPR. Initial head CT with questionable dense L MCA, but CTA revealed no LVO. EEG 9/17 with encephalopathy, no seizure activity. Chest CTA with small pleural effusion, rib fx in setting of CPR. ETT 9/16-9/20. PMH includes metastatic colon CA, HFrEF, VT s/p ICD, afib, HTN, DM, CAD, seizure disorder, recent intraabdominal abscess s/p drain.   OT comments  Pt seen in conjunction with PT, pt continues to present with impaired cog, increased pain, impaired balacne and decreased activity tolerance. Pt currently requires MAX A +2 for stand pivot transfers with RW as pt with heavy L lean ( trying to lean away from R rib pain). Pt with short guarded steps d/t pain and presents with impaired cog using humor to mask cognitive deficits. Of note pt with seizure like activity once back in bed noted to stare off with "jaw smacking activity" for ~ 20 secs and pt unresponsive, Rn notified. Pt would continue to benefit from skilled occupational therapy while admitted and after d/c to address the below listed limitations in order to improve overall functional mobility and facilitate independence with BADL participation. DC plan remains appropriate, will follow acutely per POC.      Recommendations for follow up therapy are one component of a multi-disciplinary discharge planning process, led by the attending physician.  Recommendations may be updated based on patient status, additional functional criteria and insurance authorization.    Follow Up Recommendations  Acute inpatient rehab (3hours/day)    Assistance Recommended at Discharge Frequent or constant Supervision/Assistance  Patient can return home with  the following  Two people to help with walking and/or transfers;A lot of help with bathing/dressing/bathroom;Assistance with cooking/housework;Direct supervision/assist for medications management;Direct supervision/assist for financial management;Assist for transportation;Help with stairs or ramp for entrance   Equipment Recommendations  Other (comment) (defer)    Recommendations for Other Services      Precautions / Restrictions Precautions Precautions: Fall;Other (comment) Precaution Comments: cortrak, Rt rib pain Restrictions Weight Bearing Restrictions: No       Mobility Bed Mobility Overal bed mobility: Needs Assistance Bed Mobility: Rolling, Sidelying to Sit Rolling: Mod assist Sidelying to sit: Max assist, +2 for physical assistance, HOB elevated   Sit to supine: Mod assist, +2 for physical assistance   General bed mobility comments: Step by step cues for sequencing, assist to roll and elevate trunk to get to EOB. Assist to bring LEs into bed to return to supine.    Transfers Overall transfer level: Needs assistance Equipment used: Rolling walker (2 wheels), 2 person hand held assist Transfers: Sit to/from Stand, Bed to chair/wheelchair/BSC Sit to Stand: Mod assist, Min assist, +2 physical assistance, From elevated surface Stand pivot transfers: Max assist, +2 physical assistance         General transfer comment: Min A to power to stand from elevated bed height, Mod A of 2 from low recliner with cues for hand placement. SPT chair to bed with max A of 2 with assist for weight shifting, LE advancement and balance. Heavy left lateral lean.     Balance Overall balance assessment: Needs assistance Sitting-balance support: No upper extremity supported, Feet supported Sitting balance-Leahy Scale: Fair Sitting balance - Comments: min guard-Min A   Standing balance support: During  functional activity, Reliant on assistive device for balance, Bilateral upper extremity  supported Standing balance-Leahy Scale: Poor Standing balance comment: relies on BUE and external support with posterior/left lateral lean away from rib pain                           ADL either performed or assessed with clinical judgement   ADL Overall ADL's : Needs assistance/impaired                         Toilet Transfer: Maximal assistance;+2 for physical assistance Toilet Transfer Details (indicate cue type and reason): simulated via functional mobility, heavy L lean         Functional mobility during ADLs: Moderate assistance;Maximal assistance;+2 for physical assistance;+2 for safety/equipment;Rolling walker (2 wheels) General ADL Comments: ADL participation limited by impaired cog, increased pain, impaired balacne and decreased activity tolerance    Extremity/Trunk Assessment Upper Extremity Assessment Upper Extremity Assessment: RUE deficits/detail;LUE deficits/detail;Difficult to assess due to impaired cognition RUE Deficits / Details: decreased coordination R>L but question cognition limiting LUE Deficits / Details: baseline L shoulder AROM limitations   Lower Extremity Assessment Lower Extremity Assessment: Defer to PT evaluation   Cervical / Trunk Assessment Cervical / Trunk Assessment: Normal    Vision Baseline Vision/History: 1 Wears glasses Ability to See in Adequate Light: 0 Adequate Patient Visual Report: No change from baseline     Perception Perception Perception: Not tested   Praxis Praxis Praxis: Not tested    Cognition Arousal/Alertness: Awake/alert Behavior During Therapy: Flat affect Overall Cognitive Status: Impaired/Different from baseline Area of Impairment: Following commands, Safety/judgement, Orientation, Attention, Memory, Problem solving, Awareness                 Orientation Level: Disoriented to, Time Current Attention Level: Sustained Memory: Decreased short-term memory Following Commands: Follows  one step commands with increased time, Follows one step commands inconsistently Safety/Judgement: Decreased awareness of safety, Decreased awareness of deficits Awareness: Intellectual Problem Solving: Slow processing, Difficulty sequencing, Requires verbal cues, Decreased initiation, Requires tactile cues General Comments: Pt laughing to mask cognitive deficits/questions throughout session, "this is trickery" when told the proposed plan for therapy. Thinks it is October and unable to state September despite contextual cues. Needs max encouragement, Repetition needed due to decreased initiation and slow processing. Episode of staring off into space with repetitive jaw smacking for 20 seconds at end of session, not responding during this time. RN notified.        Exercises      Shoulder Instructions       General Comments Pt with episode of staring off into space with repetitive jaw smacking for 20 seconds at end of session, not responding during this time. Sp02 dropped to 80% on RA, donned supplemental 02,. BP 145/100. RN notified. When pt came too, reports he missed the [place] game last night (they did not play) and more lethargic.    Pertinent Vitals/ Pain       Pain Assessment Pain Assessment: Faces Faces Pain Scale: Hurts whole lot Pain Location: right lower chest, rib pain Pain Descriptors / Indicators: Aching, Guarding, Grimacing, Constant, Discomfort Pain Intervention(s): Monitored during session, Patient requesting pain meds-RN notified, Limited activity within patient's tolerance, Repositioned  Home Living  Prior Functioning/Environment              Frequency  Min 2X/week        Progress Toward Goals  OT Goals(current goals can now be found in the care plan section)  Progress towards OT goals: Progressing toward goals  Acute Rehab OT Goals Patient Stated Goal: none stated Time For Goal Achievement:  09/17/22 Potential to Achieve Goals: [place] Discharge plan remains appropriate;Frequency remains appropriate    Co-evaluation      Reason for Co-Treatment: Complexity of the patient's impairments (multi-system involvement);For patient/therapist safety;To address functional/ADL transfers PT goals addressed during session: Mobility/safety with mobility;Balance OT goals addressed during session: ADL's and self-care      AM-PAC OT "6 Clicks" Daily Activity     Outcome Measure   Help from another person eating meals?: A Little Help from another person taking care of personal grooming?: A Little Help from another person toileting, which includes using toliet, bedpan, or urinal?: A Lot Help from another person bathing (including washing, rinsing, drying)?: A Lot Help from another person to put on and taking off regular upper body clothing?: A Lot Help from another person to put on and taking off regular lower body clothing?: A Lot 6 Click Score: 14    End of Session Equipment Utilized During Treatment: Gait belt;Rolling walker (2 wheels)  OT Visit Diagnosis: Other abnormalities of gait and mobility (R26.89);Muscle weakness (generalized) (M62.81);Other symptoms and signs involving cognitive function;Other symptoms and signs involving the nervous system (R29.898)   Activity Tolerance Treatment limited secondary to medical complications (Comment);Other (comment) (seizure like activity)   Patient Left in bed;with call bell/phone within reach;with bed alarm set   Nurse Communication Mobility status;Other (comment) (assess for potential seizure)        Time: 8250-0370 OT Time Calculation (min): 36 min  Charges: OT General Charges $OT Visit: 1 Visit OT Treatments $Therapeutic Activity: 8-22 mins  Harley Alto., COTA/L Acute Rehabilitation Services [phone]   Precious Haws 09/05/2022, 4:15 PM

## 2022-09-06 ENCOUNTER — Other Ambulatory Visit: Payer: Self-pay | Admitting: Physician Assistant

## 2022-09-06 DIAGNOSIS — J690 Pneumonitis due to inhalation of food and vomit: Secondary | ICD-10-CM | POA: Diagnosis not present

## 2022-09-06 DIAGNOSIS — J9601 Acute respiratory failure with hypoxia: Secondary | ICD-10-CM | POA: Diagnosis not present

## 2022-09-06 DIAGNOSIS — I469 Cardiac arrest, cause unspecified: Secondary | ICD-10-CM | POA: Diagnosis not present

## 2022-09-06 LAB — GLUCOSE, CAPILLARY
Glucose-Capillary: 108 mg/dL — ABNORMAL HIGH (ref 70–99)
Glucose-Capillary: 127 mg/dL — ABNORMAL HIGH (ref 70–99)
Glucose-Capillary: 141 mg/dL — ABNORMAL HIGH (ref 70–99)
Glucose-Capillary: 156 mg/dL — ABNORMAL HIGH (ref 70–99)

## 2022-09-06 LAB — BASIC METABOLIC PANEL
Anion gap: 11 (ref 5–15)
BUN: 11 mg/dL (ref 8–23)
CO2: 21 mmol/L — ABNORMAL LOW (ref 22–32)
Calcium: 8.1 mg/dL — ABNORMAL LOW (ref 8.9–10.3)
Chloride: 107 mmol/L (ref 98–111)
Creatinine, Ser: 0.83 mg/dL (ref 0.61–1.24)
GFR, Estimated: >60 mL/min (ref >60)
Glucose, Bld: 136 mg/dL — ABNORMAL HIGH (ref 70–99)
Potassium: 4.2 mmol/L (ref 3.5–5.1)
Sodium: 139 mmol/L (ref 135–145)

## 2022-09-06 LAB — PHOSPHORUS: Phosphorus: 2.6 mg/dL (ref 2.5–4.6)

## 2022-09-06 LAB — MAGNESIUM: Magnesium: 2.3 mg/dL (ref 1.7–2.4)

## 2022-09-06 NOTE — Progress Notes (Signed)
  Progress Note   Patient: Gregory [name] PFX:[id number] DOB: 04-03-1961 DOA: 08/30/2022     7 DOS: the patient was seen and examined on 09/06/2022   Brief hospital course: 61yo who presented  after seizure and brief PEA arrest (known seizure disorder). Weaned extubated with full neurological recovery. HFrEF with ICD, restarted on HF meds, not decompensated. Cardiology was consulted and following  Assessment and Plan: PEA cardiac arrest -likely occurred following aspiration event following seizure -seems stable -Cardiology following. Discussed with Cardiology who reviewed updated echo, no significant change -Cardiology recs to cont treatment for CAD/CHF/VT with home meds  Seizures -Last seen by Neuro 9/20 with recs to continue keppra 1.5g bid at which point Neuro had signed off. Keppra dose remained at 1g bid, however for unclear reason -Care was transferred to [facility] on 9/22. On that day, pt was noted to have seizure lasting around 20 sec. -Made keppra dose changes as previously recommended by Neuro above. Also discussed with Neurology who recommended giving 1g IV keppra load x 1 -Seems stable this AM  Aspiration PNA -completed course of abx  Severe protein calorie malnutrition -Cont PO and supplemental tube feeding as tolerated -Dietitian recs noted. Recs to transition to 24hr continuous feedings until electrolytes stabilize and po intake further improves  HFrEF, recurrent VT, Afib on eliquis -Seen by Cardiology who has signed off. As per above  Metastatic colon cancer -continue analgesia as needed -on Xeloda as outpt      Subjective: Eager to have NG removed   Physical Exam: Vitals:   09/05/22 1626 09/05/22 2047 09/06/22 0433 09/06/22 0915  BP: (!) 158/110 112/76 134/89 (!) 142/91  Pulse: 73 71 69 69  Resp: '20 19 18 15  '$ Temp:  97.7 F (36.5 C) 98.2 F (36.8 C) 98.2 F (36.8 C)  TempSrc:  Oral Oral Oral  SpO2: 97% 99% 97% 99%  Weight:       General exam:  Conversant, in no acute distress Respiratory system: normal chest rise, clear, no audible wheezing Cardiovascular system: regular rhythm, s1-s2 Gastrointestinal system: Nondistended, nontender, pos BS Central nervous system: No seizures, no tremors Extremities: No cyanosis, no joint deformities Skin: No rashes, no pallor Psychiatry: Affect normal // no auditory hallucinations   Data Reviewed:  Labs reviewed: Na 139, K 4.2, Cr 0.83, Hgb 11.3  Family Communication: Pt in room, family not at bedside  Disposition: Status is: Inpatient Remains inpatient appropriate because: Severity of illness  Planned Discharge Destination: Rehab    Author: Marylu Lund, MD 09/06/2022 5:42 PM  For on call review [email or web address].

## 2022-09-07 DIAGNOSIS — J690 Pneumonitis due to inhalation of food and vomit: Secondary | ICD-10-CM | POA: Diagnosis not present

## 2022-09-07 DIAGNOSIS — J9601 Acute respiratory failure with hypoxia: Secondary | ICD-10-CM | POA: Diagnosis not present

## 2022-09-07 DIAGNOSIS — I469 Cardiac arrest, cause unspecified: Secondary | ICD-10-CM | POA: Diagnosis not present

## 2022-09-07 LAB — COMPREHENSIVE METABOLIC PANEL
ALT: 95 U/L — ABNORMAL HIGH (ref 0–44)
AST: 43 U/L — ABNORMAL HIGH (ref 15–41)
Albumin: 2.4 g/dL — ABNORMAL LOW (ref 3.5–5.0)
Alkaline Phosphatase: 145 U/L — ABNORMAL HIGH (ref 38–126)
Anion gap: 6 (ref 5–15)
BUN: 11 mg/dL (ref 8–23)
CO2: 25 mmol/L (ref 22–32)
Calcium: 8.1 mg/dL — ABNORMAL LOW (ref 8.9–10.3)
Chloride: 109 mmol/L (ref 98–111)
Creatinine, Ser: 0.81 mg/dL (ref 0.61–1.24)
GFR, Estimated: >60 mL/min (ref >60)
Glucose, Bld: 152 mg/dL — ABNORMAL HIGH (ref 70–99)
Potassium: 4.1 mmol/L (ref 3.5–5.1)
Sodium: 140 mmol/L (ref 135–145)
Total Bilirubin: 0.7 mg/dL (ref 0.3–1.2)
Total Protein: 5.8 g/dL — ABNORMAL LOW (ref 6.5–8.1)

## 2022-09-07 LAB — CBC
HCT: 33.8 % — ABNORMAL LOW (ref 39.0–52.0)
Hemoglobin: 11.2 g/dL — ABNORMAL LOW (ref 13.0–17.0)
MCH: 31.7 pg (ref 26.0–34.0)
MCHC: 33.1 g/dL (ref 30.0–36.0)
MCV: 95.8 fL (ref 80.0–100.0)
Platelets: 175 10*3/uL (ref 150–400)
RBC: 3.53 MIL/uL — ABNORMAL LOW (ref 4.22–5.81)
RDW: 22.8 % — ABNORMAL HIGH (ref 11.5–15.5)
WBC: 10.2 10*3/uL (ref 4.0–10.5)
nRBC: 0.3 % — ABNORMAL HIGH (ref 0.0–0.2)

## 2022-09-07 LAB — GLUCOSE, CAPILLARY
Glucose-Capillary: 135 mg/dL — ABNORMAL HIGH (ref 70–99)
Glucose-Capillary: 157 mg/dL — ABNORMAL HIGH (ref 70–99)
Glucose-Capillary: 175 mg/dL — ABNORMAL HIGH (ref 70–99)
Glucose-Capillary: 99 mg/dL (ref 70–99)

## 2022-09-07 MED ORDER — LIDOCAINE 4 % EX CREA
TOPICAL_CREAM | Freq: Three times a day (TID) | CUTANEOUS | Status: DC | PRN
  Administered 2022-09-07 – 2022-09-08 (×2): 1 via TOPICAL
  Filled 2022-09-07: qty 5

## 2022-09-07 MED ORDER — LIDOCAINE HCL URETHRAL/MUCOSAL 2 % EX GEL
1.0000 | Freq: Once | CUTANEOUS | Status: DC
  Filled 2022-09-07: qty 6

## 2022-09-07 NOTE — Progress Notes (Signed)
  Progress Note   Patient: Gregory [name] [id number]:[id number] DOB: 18-Sep-1961 DOA: 08/30/2022     8 DOS: the patient was seen and examined on 09/07/2022   Brief hospital course: 61yo who presented  after seizure and brief PEA arrest (known seizure disorder). Weaned extubated with full neurological recovery. HFrEF with ICD, restarted on HF meds, not decompensated. Cardiology was consulted and following  Assessment and Plan: PEA cardiac arrest -likely occurred following aspiration event following seizure -seems stable -Cardiology following. Discussed with Cardiology who reviewed updated echo, no significant change -Cardiology recs to cont treatment for CAD/CHF/VT with home meds  Seizures -Last seen by Neuro 9/20 with recs to continue keppra 1.5g bid at which point Neuro had signed off. Keppra dose remained at 1g bid, however for unclear reason -Care was transferred to [facility] on 9/22. On that day, pt was noted to have seizure lasting around 20 sec. -Made keppra dose changes as previously recommended by Neuro above. Also discussed with Neurology who recommended giving 1g IV keppra load x 1 -remains stable thus far -Pt medically clear for CIR at this time  Aspiration PNA -completed course of abx  Severe protein calorie malnutrition -Cont PO and supplemental tube feeding as tolerated -Dietitian recs noted. Recs to transition to 24hr continuous feedings until electrolytes stabilize and po intake further improves  HFrEF, recurrent VT, Afib on eliquis -Seen by Cardiology who has signed off. As per above  Metastatic colon cancer -continue analgesia as needed -on Xeloda as outpt      Subjective: Remains eager to have NG removed  Physical Exam: Vitals:   09/06/22 1749 09/07/22 0549 09/07/22 0939 09/07/22 1628  BP: (!) 129/93 (!) 124/94 (!) 133/90 111/78  Pulse: 72 67 69 66  Resp: 18 18    Temp: 98.4 F (36.9 C) 98.4 F (36.9 C) 98 F (36.7 C) 98.3 F (36.8 C)  TempSrc: Oral   Oral Oral  SpO2: 97% 93% 96% 96%  Weight:       General exam: Awake, laying in bed, in nad Respiratory system: Normal respiratory effort, no wheezing Cardiovascular system: regular rate, s1, s2 Gastrointestinal system: Soft, nondistended, positive BS Central nervous system: CN2-12 grossly intact, strength intact Extremities: Perfused, no clubbing Skin: Normal skin turgor, no notable skin lesions seen Psychiatry: Mood normal // no visual hallucinations   Data Reviewed:  Labs reviewed: Na 140, K 4.1, Cr 0.81, Hgb 11.2  Family Communication: Pt in room, family not at bedside  Disposition: Status is: Inpatient Remains inpatient appropriate because: Severity of illness  Planned Discharge Destination: Rehab    Author: Marylu Lund, MD 09/07/2022 5:45 PM  For on call review [email or web address].

## 2022-09-08 DIAGNOSIS — J9601 Acute respiratory failure with hypoxia: Secondary | ICD-10-CM | POA: Diagnosis not present

## 2022-09-08 DIAGNOSIS — I469 Cardiac arrest, cause unspecified: Secondary | ICD-10-CM | POA: Diagnosis not present

## 2022-09-08 DIAGNOSIS — J690 Pneumonitis due to inhalation of food and vomit: Secondary | ICD-10-CM | POA: Diagnosis not present

## 2022-09-08 LAB — COMPREHENSIVE METABOLIC PANEL
ALT: 83 U/L — ABNORMAL HIGH (ref 0–44)
AST: 35 U/L (ref 15–41)
Albumin: 2.5 g/dL — ABNORMAL LOW (ref 3.5–5.0)
Alkaline Phosphatase: 173 U/L — ABNORMAL HIGH (ref 38–126)
Anion gap: 6 (ref 5–15)
BUN: 13 mg/dL (ref 8–23)
CO2: 28 mmol/L (ref 22–32)
Calcium: 8.4 mg/dL — ABNORMAL LOW (ref 8.9–10.3)
Chloride: 107 mmol/L (ref 98–111)
Creatinine, Ser: 0.93 mg/dL (ref 0.61–1.24)
GFR, Estimated: >60 mL/min (ref >60)
Glucose, Bld: 147 mg/dL — ABNORMAL HIGH (ref 70–99)
Potassium: 3.9 mmol/L (ref 3.5–5.1)
Sodium: 141 mmol/L (ref 135–145)
Total Bilirubin: 0.6 mg/dL (ref 0.3–1.2)
Total Protein: 6.2 g/dL — ABNORMAL LOW (ref 6.5–8.1)

## 2022-09-08 LAB — GLUCOSE, CAPILLARY
Glucose-Capillary: 160 mg/dL — ABNORMAL HIGH (ref 70–99)
Glucose-Capillary: 204 mg/dL — ABNORMAL HIGH (ref 70–99)
Glucose-Capillary: 229 mg/dL — ABNORMAL HIGH (ref 70–99)
Glucose-Capillary: 239 mg/dL — ABNORMAL HIGH (ref 70–99)
Glucose-Capillary: 281 mg/dL — ABNORMAL HIGH (ref 70–99)

## 2022-09-08 LAB — CBC
HCT: 37.6 % — ABNORMAL LOW (ref 39.0–52.0)
Hemoglobin: 11.9 g/dL — ABNORMAL LOW (ref 13.0–17.0)
MCH: 31.8 pg (ref 26.0–34.0)
MCHC: 31.6 g/dL (ref 30.0–36.0)
MCV: 100.5 fL — ABNORMAL HIGH (ref 80.0–100.0)
Platelets: 217 10*3/uL (ref 150–400)
RBC: 3.74 MIL/uL — ABNORMAL LOW (ref 4.22–5.81)
RDW: 23.1 % — ABNORMAL HIGH (ref 11.5–15.5)
WBC: 11.2 10*3/uL — ABNORMAL HIGH (ref 4.0–10.5)
nRBC: 0.4 % — ABNORMAL HIGH (ref 0.0–0.2)

## 2022-09-08 MED ORDER — INSULIN ASPART 100 UNIT/ML IJ SOLN: 0.0000 [IU] | Freq: Every day | INTRAMUSCULAR | Status: DC

## 2022-09-08 MED ORDER — INSULIN ASPART 100 UNIT/ML IJ SOLN: 0.0000 [IU] | Freq: Three times a day (TID) | INTRAMUSCULAR | Status: DC

## 2022-09-08 MED ORDER — INSULIN ASPART 100 UNIT/ML IJ SOLN
0.0000 [IU] | Freq: Three times a day (TID) | INTRAMUSCULAR | Status: DC
  Administered 2022-09-08: 5 [IU] via SUBCUTANEOUS
  Administered 2022-09-09 (×2): 3 [IU] via SUBCUTANEOUS
  Administered 2022-09-10: 2 [IU] via SUBCUTANEOUS
  Administered 2022-09-10 (×2): 3 [IU] via SUBCUTANEOUS
  Administered 2022-09-11: 8 [IU] via SUBCUTANEOUS

## 2022-09-08 NOTE — Progress Notes (Signed)
Nutrition Follow-up  DOCUMENTATION CODES:   Severe malnutrition in context of chronic illness  INTERVENTION:   OK to D/C Cortrak and TF orders from RD perspective; discussed with MD and RN.   Ensure Enlive po TD, each supplement provides 350 kcal and 20 grams of protein.  NUTRITION DIAGNOSIS:   Severe Malnutrition related to chronic illness (cancer) as evidenced by severe fat depletion, severe muscle depletion.  Being addressed via diet, supplements  GOAL:   Patient will meet greater than or equal to 90% of their needs  Progressing  MONITOR:   PO intake, Supplement acceptance, Skin, Labs  REASON FOR ASSESSMENT:   Consult Enteral/tube feeding initiation and management  ASSESSMENT:   Pt with hx of metastatic colon cancer, CHF, CAD, DM type 2, chronic pancreatitis, HTN, HLD, and seizure disorder brought to ED after exhibiting acute strokelike symptoms. After arriving to ED, taken for emergent CT but found to be profoundly hypoxic and was emergently intubated. After intubation, had a PEA arrest with ROSC ~5 minutes.  Pt alert and wanting to get Cortrak out Noted recommendation for CIR  Vital 1.5 infusing at goal of 60 ml/hr; pt tolerating  Pt ate all of the ham sandwich at lunch today and drank some juice. Pt also drinking Ensure Shakes; pt reports he believes he can drink 2-3 per day. RN reports family brining in outside food for pt over the weekend as well and pt ate a whole bowl of chili with cornbread. Recorded po intake 50-100% of meals yesterday  Electrolytes have stabilized  Labs: phosphorus 2.6 (wdl), potassium 3.9 (wdl), magnesium 2.3 (wdl) Meds:   Diet Order:   Diet Order             Diet regular Room service appropriate? Yes; Fluid consistency: Thin; Fluid restriction: 1800 mL Fluid  Diet effective now                   EDUCATION NEEDS:   Education needs have been addressed  Skin:  Skin Assessment: Skin Integrity Issues: Skin Integrity  Issues:: Stage I Stage I: sacrum  Last BM:  9/23  Height:   Ht Readings from Last 1 Encounters:  08/29/22 '6\' 6"'$  (1.981 m)    Weight:   Wt Readings from Last 1 Encounters:  09/07/22 104.8 kg    Ideal Body Weight:  97.3 kg  BMI:  Body mass index is 26.7 kg/m.  Estimated Nutritional Needs:   Kcal:  2300-2500 kcals  Protein:  125-140g/d  Fluid:  1.8 L  Kerman Passey MS, RDN, LDN, CNSC Registered Dietitian 3 Clinical Nutrition RD Pager and On-Call Pager Number Located in [place]

## 2022-09-08 NOTE — Progress Notes (Signed)
Heart Failure Navigator Progress Note  Assessed for Heart & Vascular TOC clinic readiness.  Patient does not meet criteria due to per Cardiology [facility] .   Navigation Team to sign off per Cardiology  Earnestine Leys, BSN, RN Heart Failure Leisure centre manager Chat Only

## 2022-09-08 NOTE — Progress Notes (Addendum)
Inpatient Rehabilitation Admissions Coordinator   Pre service center corrected his payor this morning and I have contacted [employer] to begin Auth for Cir admit.  Danne Baxter, RN, MSN Rehab Admissions Coordinator [phone] 09/08/2022 8:34 AM

## 2022-09-08 NOTE — PMR Pre-admission (Signed)
PMR Admission Coordinator Pre-Admission Assessment  Patient: Gregory [name] is an 61 y.o., male MRN: [id number] DOB: 19-Jan-1961 Height:   Weight: 96.7 kg  Insurance Information HMO:     PPO:      PCP:      IPA:      80/20:      OTHER:  PRIMARY: [employer]/Like a workers comp case      Policy#: [id number]      Subscriber: pt CM Name: Jari Favre      Phone#: [phone]     Fax#: [phone] Pre-Cert#: claim # [id number] approved for 7 days only. Would have to appeal for additional days     Employer: [employer], [place]; date of injury was 05/29/2004; retired [place], anything heart related goes through this claim Benefits:  Phone #: [employer], [place]     Name: 9/25 Eff. Date: 05/29/2004     Deduct:       Out of Pocket Max:       Life Max:  CIR: per workers comp guidelines      SNF:  Outpatient:      Co-Pay:  Home Health:       Co-Pay:  DME:      Co-Pay:  Providers:   SECONDARY: [employer]      Policy#: [id number]  Financial Counselor:       Phone#:   The "Data Collection Information Summary" for patients in Inpatient Rehabilitation Facilities with attached "Privacy Act [place] Records" was provided and verbally reviewed with: N/A  Emergency Contact Information Contact Information     Name Relation Home Work [place] Daughter [phone]  [phone]   CARLEY, STRICKLING   [phone]   [facility] Mother [phone]     Spallone,[name] Brother [phone]     [place], Kohles Daughter   [phone]   Helmut Muster   [phone]      Current Medical History  Patient Admitting Diagnosis: Cardiac arrest  History of Present Illness: 61 year old male with history of metastatic colon cancer, Heart failure, VT s/p ICD, afib, HTN, DM, CAD , and seizure disorder. Presented on 9/16 after seizure and brief PEA arrest. Felt likely occurred following an aspiration event following a seizure. Cardiology  consulted. Recommended continue home meds. AICD interrogated.  Neurology consulted and recommends to continue [place]. For his aspiration pneumonia he has completed his course of antibiotics.   Noted severe protein calorie malnutrition. Initially had feeding tube and dietician consulted. Will continue po an supplemental feedings.   Complete NIHSS TOTAL: 0  Patient's medical record from [facility] has been reviewed by the rehabilitation admission coordinator and physician.  Past Medical History  Past Medical History:  Diagnosis Date   AICD (automatic cardioverter/defibrillator) present 2005   CAD (coronary artery disease) 12/01/2013   Chronic combined systolic and diastolic CHF, NYHA class 1 ([place]) 12/01/2013   Erectile dysfunction 12/01/2013   HTN (hypertension) 12/01/2013   Hyperlipidemia 12/01/2013   Ischemic cardiomyopathy 12/01/2013   Presence of permanent cardiac pacemaker    Sleep apnea    Has the patient had major surgery during 100 days prior to admission? No  Family History   family history includes Brain cancer in his father; Heart disease in his mother; Hypertension in his daughter.  Current Medications  Current Facility-Administered Medications:    acetaminophen (TYLENOL) tablet 650 mg, 650 mg, Oral, Q4H PRN, Kipp Brood, MD, 650 mg at 09/11/22 0020   amiodarone (PACERONE) tablet 400 mg, 400  mg, Oral, Daily, Agarwala, Ravi, MD, 400 mg at 09/11/22 0923   apixaban (ELIQUIS) tablet 5 mg, 5 mg, Oral, BID, Agarwala, Ravi, MD, 5 mg at 09/11/22 0923   atorvastatin (LIPITOR) tablet 80 mg, 80 mg, Oral, QPM, Agarwala, Ravi, MD, 80 mg at 09/10/22 1738   clopidogrel (PLAVIX) tablet 75 mg, 75 mg, Oral, Daily, Agarwala, Ravi, MD, 75 mg at 09/11/22 0920   dapagliflozin propanediol (FARXIGA) tablet 10 mg, 10 mg, Oral, Daily, Agarwala, Ravi, MD, 10 mg at 09/11/22 4128   diclofenac Sodium (VOLTAREN) 1 % topical gel 2 g, 2 g, Topical, QID, Agarwala, Ravi, MD, 2 g at 09/10/22  1221   docusate sodium (COLACE) capsule 100 mg, 100 mg, Oral, BID PRN, Kipp Brood, MD   ezetimibe (ZETIA) tablet 10 mg, 10 mg, Oral, Daily, Agarwala, Ravi, MD, 10 mg at 09/11/22 7867   feeding supplement (ENSURE ENLIVE / ENSURE PLUS) liquid 237 mL, 237 mL, Oral, TID BM, Agarwala, Ravi, MD, 237 mL at 09/11/22 0927   furosemide (LASIX) tablet 40 mg, 40 mg, Oral, Daily, Agarwala, Ravi, MD, 40 mg at 09/11/22 6720   hydrALAZINE (APRESOLINE) injection 10 mg, 10 mg, Intravenous, Q4H PRN, Donne Hazel, MD   insulin aspart (novoLOG) injection 0-15 Units, 0-15 Units, Subcutaneous, TID WC, Donne Hazel, MD, 2 Units at 09/10/22 1737   insulin aspart (novoLOG) injection 0-5 Units, 0-5 Units, Subcutaneous, QHS, Donne Hazel, MD   levETIRAcetam (KEPPRA) tablet 1,500 mg, 1,500 mg, Oral, BID, Donne Hazel, MD, 1,500 mg at 09/11/22 9470   levothyroxine (SYNTHROID) tablet 50 mcg, 50 mcg, Oral, Q0600, Kipp Brood, MD, 50 mcg at 09/11/22 0555   lidocaine (LMX) 4 % cream, , Topical, TID PRN, Donne Hazel, MD, 1 Application at 96/28/36 0949   LORazepam (ATIVAN) injection 4 mg, 4 mg, Intravenous, Q5 min PRN, Agarwala, Ravi, MD, 4 mg at 08/30/22 2156   metoprolol succinate (TOPROL-XL) 24 hr tablet 50 mg, 50 mg, Oral, BID, Agarwala, Ravi, MD, 50 mg at 09/11/22 0919   multivitamin with minerals tablet 1 tablet, 1 tablet, Oral, Daily, Agarwala, Ravi, MD, 1 tablet at 09/11/22 0922   ondansetron (ZOFRAN) injection 4 mg, 4 mg, Intravenous, Q6H PRN, Agarwala, Ravi, MD   oxyCODONE (Oxy IR/ROXICODONE) immediate release tablet 10 mg, 10 mg, Oral, Q4H PRN, Agarwala, Ravi, MD, 10 mg at 09/11/22 0556   pantoprazole (PROTONIX) EC tablet 40 mg, 40 mg, Oral, Daily, Agarwala, Ravi, MD, 40 mg at 09/11/22 6294   potassium chloride (KLOR-CON M) CR tablet 10 mEq, 10 mEq, Oral, Daily, Agarwala, Ravi, MD, 10 mEq at 09/11/22 0920   sacubitril-valsartan (ENTRESTO) 97-103 mg per tablet, 1 tablet, Oral, BID, Agarwala, Einar Grad, MD,  1 tablet at 09/11/22 0924   zolpidem (AMBIEN) tablet 5 mg, 5 mg, Oral, QHS PRN, Donne Hazel, MD, 5 mg at 09/10/22 2129  Patients Current Diet:  Diet Order             Diet - low sodium heart healthy           Diet regular Room service appropriate? Yes; Fluid consistency: Thin; Fluid restriction: 1800 mL Fluid  Diet effective now                  Precautions / Restrictions Precautions Precautions: Fall, Other (comment) Precaution Comments: Rt rib pain Restrictions Weight Bearing Restrictions: No   Has the patient had 2 or more falls or a fall with injury in the past year? No  Prior Activity  Level Limited Community (1-2x/wk): Mod I with RW, needed some asist with adls  Prior Functional Level Self Care: Did the patient need help bathing, dressing, using the toilet or eating? Needed some help  Indoor Mobility: Did the patient need assistance with walking from room to room (with or without device)? Independent  Stairs: Did the patient need assistance with internal or external stairs (with or without device)? Independent  Functional Cognition: Did the patient need help planning regular tasks such as shopping or remembering to take medications? Independent  Patient Information Are you of Hispanic, Latino/a,or Spanish origin?: A. No, not of Hispanic, Latino/a, or Spanish origin What is your race?: B. Black or African American Do you need or want an interpreter to communicate with a doctor or health care staff?: 0. No  Patient's Response To:  Health Literacy and Transportation Is the patient able to respond to health literacy and transportation needs?: Yes Health Literacy - How often do you need to have someone help you when you read instructions, pamphlets, or other written material from your doctor or pharmacy?: Never In the past 12 months, has lack of transportation kept you from medical appointments or from getting medications?: No In the past 12 months, has lack of  transportation kept you from meetings, work, or from getting things needed for daily living?: No  Development worker, international aid / Equipment Home Equipment: Shower seat, Engineer, manufacturing held shower head, Conservation officer, nature (2 wheels)  Prior Device Use: Indicate devices/aids used by the patient prior to current illness, exacerbation or injury? Walker  Current Functional Level Cognition  Arousal/Alertness: Awake/alert Overall Cognitive Status: Impaired/Different from baseline Current Attention Level: Sustained Orientation Level: Oriented X4 Following Commands: Follows multi-step commands with increased time Safety/Judgement: Decreased awareness of safety, Decreased awareness of deficits General Comments: cognition continues to improve Attention: Sustained Sustained Attention: Impaired Memory: Impaired Memory Impairment: Decreased short term memory Decreased Short Term Memory: Verbal basic Awareness: Appears intact Problem Solving: Impaired Problem Solving Impairment: Verbal complex Safety/Judgment: Appears intact    Extremity Assessment (includes Sensation/Coordination)  Upper Extremity Assessment: RUE deficits/detail, LUE deficits/detail, Difficult to assess due to impaired cognition RUE Deficits / Details: decreased coordination R>L but question cognition limiting LUE Deficits / Details: baseline L shoulder AROM limitations  Lower Extremity Assessment: Defer to PT evaluation RLE Deficits / Details: MMT screen seated EOB >4/5 strength in hip, knees and ankles but pt with poor motor planning and apparent decreased coordination with RLE requiring multimodal cues for correct movement (ex. pt performing ankle pumps with LLE despite multiple cues to perform with RLE)    ADLs  Overall ADL's : Needs assistance/impaired Eating/Feeding: NPO Grooming: Minimal assistance, Bed level, Wash/dry face Upper Body Dressing : Minimal assistance, Sitting Lower Body Dressing: Maximal assistance, +2 for safety/equipment,  +2 for physical assistance, Sit to/from stand Lower Body Dressing Details (indicate cue type and reason): assist to don socks, min assist + 2 in standing but relies on BUE support Toilet Transfer: Maximal assistance, +2 for physical assistance Toilet Transfer Details (indicate cue type and reason): simulated via functional mobility, heavy L lean Functional mobility during ADLs: Moderate assistance, Maximal assistance, +2 for physical assistance, +2 for safety/equipment, Rolling walker (2 wheels) General ADL Comments: ADL participation limited by impaired cog, increased pain, impaired balacne and decreased activity tolerance    Mobility  Overal bed mobility: Needs Assistance Bed Mobility: Supine to Sit Rolling: Mod assist Sidelying to sit: Max assist, +2 for physical assistance, HOB elevated Supine to sit: Min assist Sit  to supine: Min guard, HOB elevated General bed mobility comments: min guard for pt to swing LE around in bed with HoB elevated    Transfers  Overall transfer level: Needs assistance Equipment used: Rolling walker (2 wheels), 1 person hand held assist Transfers: Sit to/from Stand, Bed to chair/wheelchair/BSC Sit to Stand: From elevated surface, +2 physical assistance, Mod assist Bed to/from chair/wheelchair/BSC transfer type:: Squat pivot Stand pivot transfers: Mod assist, +2 physical assistance General transfer comment: modA for power up from low recliner seat to RW, min A for face to face HHA power up but modA required for steadying while RN checked for evidence of pressure injury    Ambulation / Gait / Stairs / Wheelchair Mobility  Ambulation/Gait Ambulation/Gait assistance: Min assist, Mod assist Gait Distance (Feet): 60 Feet (+10) Assistive device: Rolling walker (2 wheels) Gait Pattern/deviations: Step-through pattern, Decreased weight shift to left General Gait Details: min A for steadying with ambulation, vc for upright posture, reduced grip on RW and relaxation  through shoulders, modA for backward stepping with RW Gait velocity: decreased Gait velocity interpretation: <1.31 ft/sec, indicative of household ambulator Pre-gait activities: marching in place    Posture / Balance Dynamic Sitting Balance Sitting balance - Comments: min guard-Min A Balance Overall balance assessment: Needs assistance Sitting-balance support: No upper extremity supported, Feet supported Sitting balance-Leahy Scale: Fair Sitting balance - Comments: min guard-Min A Standing balance support: During functional activity, Reliant on assistive device for balance, Bilateral upper extremity supported Standing balance-Leahy Scale: Poor Standing balance comment: relies on UE support    Special needs/care consideration on 9/16 on acute sacrum mid stage 1 (7 cm length, 4 cm width, and 0 cm depth) DNR on acute AICD   Previous Home Environment  Living Arrangements:  (son and patient live together)  Lives With: Son Available Help at Discharge: Family, Available 24 hours/day Type of Home: House Home Layout: Two level, Bed/bath upstairs Alternate Level Stairs-Rails: Right, Left, Can reach both Alternate Level Stairs-Number of Steps: 14 Home Access: Level entry Bathroom Shower/Tub: Multimedia programmer: Standard Bathroom Accessibility: Yes How Accessible: Accessible via walker Home Care Services: No Additional Comments: home setup per prior admission and pt repoort- poor historian  Discharge Living Setting Plans for Discharge Living Setting: Patient's home, Lives with (comment) (son) Type of Home at Discharge: House Discharge Home Layout: Two level, 1/2 bath on main level, Bed/bath upstairs Alternate Level Stairs-Rails: Right, Left Alternate Level Stairs-Number of Steps: 14 Discharge Home Access: Level entry Discharge Bathroom Shower/Tub: Tub/shower unit, Walk-in shower Discharge Bathroom Toilet: Standard Discharge Bathroom Accessibility: Yes How Accessible:  Accessible via walker Does the patient have any problems obtaining your medications?: No  Social/Family/Support Systems Patient Roles: Parent Contact Information: son, Amon IV Anticipated Caregiver: son Anticipated Caregiver's Contact Information: see contacts Ability/Limitations of Caregiver: son works from home and has taken [employer] as needed Caregiver Availability: 24/7 Discharge Plan Discussed with Primary Caregiver: Yes Is Caregiver In Agreement with Plan?: Yes Does Caregiver/Family have Issues with Lodging/Transportation while Pt is in Rehab?: No  Goals Patient/Family Goal for Rehab: superivision to min with PT and OT, supervision with SLP Expected length of stay: ELOS 10 to 14 days Pt/Family Agrees to Admission and willing to participate: Yes Program Orientation Provided & Reviewed with Pt/Caregiver Including Roles  & Responsibilities: Yes  Decrease burden of Care through IP rehab admission: n/a  Possible need for SNF placement upon discharge: not anticipated  Patient Condition: I have reviewed medical records from [facility], spoken  with CM, and patient and son. I met with patient at the bedside for inpatient rehabilitation assessment.  Patient will benefit from ongoing PT, OT, and SLP, can actively participate in 3 hours of therapy a day 5 days of the week, and can make measurable gains during the admission.  Patient will also benefit from the coordinated team approach during an Inpatient Acute Rehabilitation admission.  The patient will receive intensive therapy as well as Rehabilitation physician, nursing, social worker, and care management interventions.  Due to bladder management, bowel management, safety, skin/wound care, disease management, medication administration, pain management, and patient education the patient requires 24 hour a day rehabilitation nursing.  The patient is currently is min to mod assist overall with mobility and basic ADLs.  Discharge setting and  therapy post discharge at home with home health is anticipated.  Patient has agreed to participate in the Acute Inpatient Rehabilitation Program and will admit today.  Preadmission Screen Completed By:  Cleatrice Burke, 09/11/2022 10:23 AM ______________________________________________________________________   Discussed status with Dr. Dagoberto Ligas on 09/11/22 at 29 and received approval for admission today.  Admission Coordinator:  Cleatrice Burke, RN, time 1022 Date 09/11/22   Assessment/Plan: Diagnosis: Does the need for close, 24 hr/day Medical supervision in concert with the patient's rehab needs make it unreasonable for this patient to be served in a less intensive setting? Yes Co-Morbidities requiring supervision/potential complications: stage III/IV colon CA; Afib on AC; seizures, PEA arrest; severe malnutrition Due to bladder management, bowel management, safety, skin/wound care, disease management, medication administration, pain management, and patient education, does the patient require 24 hr/day rehab nursing? Yes Does the patient require coordinated care of a physician, rehab nurse, PT, OT, and SLP to address physical and functional deficits in the context of the above medical diagnosis(es)? Yes Addressing deficits in the following areas: balance, endurance, locomotion, strength, transferring, bowel/bladder control, bathing, dressing, feeding, grooming, toileting, cognition, and speech Can the patient actively participate in an intensive therapy program of at least 3 hrs of therapy 5 days a week? Yes The potential for patient to make measurable gains while on inpatient rehab is good Anticipated functional outcomes upon discharge from inpatient rehab: supervision and min assist PT, supervision and min assist OT, supervision SLP Estimated rehab length of stay to reach the above functional goals is: 10-14 days Anticipated discharge destination: Home 10. Overall  Rehab/Functional Prognosis: good   MD Signature:

## 2022-09-08 NOTE — Progress Notes (Signed)
  Progress Note   Patient: Gregory [name] DPO:[id number] DOB: Jul 07, 1961 DOA: 08/30/2022     9 DOS: the patient was seen and examined on 09/08/2022   Brief hospital course: 61yo who presented  after seizure and brief PEA arrest (known seizure disorder). Weaned extubated with full neurological recovery. HFrEF with ICD, restarted on HF meds, not decompensated. Cardiology was consulted and following  Assessment and Plan: PEA cardiac arrest -likely occurred following aspiration event following seizure -seems stable -Cardiology following. Discussed with Cardiology who reviewed updated echo, no significant change -Cardiology recs to cont treatment for CAD/CHF/VT with home meds  Seizures -Last seen by Neuro 9/20 with recs to continue keppra 1.5g bid at which point Neuro had signed off. Keppra dose remained at 1g bid, however for unclear reason -Care was [facility] to [facility] on 9/22. On that day, pt was noted to have seizure lasting around 20 sec. -Made keppra dose changes as previously recommended by Neuro above. Also discussed with Neurology who recommended giving 1g IV keppra load x 1 -remains stable thus far, no seizures since -Pt medically clear for CIR at this time  Aspiration PNA -completed course of abx  Severe protein calorie malnutrition -Cont PO and supplemental tube feeding as tolerated -Dietitian recs noted. Initially had required tube feeding over weekend. Discussed with Dietitian. Nutritional status improved, NG to be removed  HFrEF, recurrent VT, Afib on eliquis -Seen by Cardiology who has signed off. As per above  Metastatic colon cancer -continue analgesia as needed -on Xeloda as outpt      Subjective: Seen this AM. Pt eager to have NG removed this AM  Physical Exam: Vitals:   09/07/22 2138 09/07/22 2200 09/08/22 0617 09/08/22 0936  BP: 133/82  135/88 126/72  Pulse: 63  65 70  Resp: '17  17 17  '$ Temp: 98.2 F (36.8 C)  98.1 F (36.7 C) 98 F (36.7 C)   TempSrc:   Oral   SpO2: 98%  94% 94%  Weight:  104.8 kg     General exam: Conversant, in no acute distress Respiratory system: normal chest rise, clear, no audible wheezing Cardiovascular system: regular rhythm, s1-s2 Gastrointestinal system: Nondistended, nontender, pos BS Central nervous system: No seizures, no tremors Extremities: No cyanosis, no joint deformities Skin: No rashes, no pallor Psychiatry: Affect normal // no auditory hallucinations   Data Reviewed:  Labs reviewed: Na 141, K 3.9, Cr 0.9.3, Hgb 11.9  Family Communication: Pt in room, family not at bedside  Disposition: Status is: Inpatient Remains inpatient appropriate because: Severity of illness  Planned Discharge Destination: Rehab    Author: Marylu Lund, MD 09/08/2022 3:04 PM  For on call review [email or web address].

## 2022-09-08 NOTE — TOC Progression Note (Signed)
Transition of Care [facility]) - Progression Note    Patient Details  Name: Gregory [name] MRN: [id number] Date of Birth: Sep 23, 1961  Transition of Care [facility]) CM/SW Contact  Tom-Johnson, Renea Ee, RN Phone Number: 09/08/2022, 4:12 PM  Clinical Narrative:     CIR following, awaiting Insurance Auth approval. CM will continue to follow with needs.   Expected Discharge Plan: IP Rehab Facility Barriers to Discharge: Continued Medical Work up  Expected Discharge Plan and Services Expected Discharge Plan: [place] In-house Referral: NA Discharge Planning Services: CM Consult Post Acute Care Choice: IP Rehab Living arrangements for the past 2 months: Single Family Home                 DME Arranged: N/A DME Agency: NA       HH Arranged: NA           Social Determinants of Health (SDOH) Interventions    Readmission Risk Interventions    09/04/2022   11:14 AM 04/22/2022   12:49 PM  Readmission Risk Prevention Plan  Transportation Screening Complete Complete  PCP or Specialist Appt within 3-5 Days  Complete  HRI or [place]  Complete  Social Work Consult for [place] Planning/Counseling  Complete  Palliative Care Screening  Not Applicable  Medication Review Press photographer) Complete Complete  PCP or Specialist appointment within 3-5 days of discharge Complete   HRI or [place] Complete   SW Recovery Care/Counseling Consult Complete   [place] Not Applicable

## 2022-09-08 NOTE — Progress Notes (Signed)
Physical Therapy Treatment Patient Details Name: Gregory [name] MRN: [id number] DOB: April 05, 1961 Today's Date: 09/08/2022   History of Present Illness Pt is a 61 y.o. male admitted 08/30/22 with AMS as code stroke. Pt decompensated in ED requiring intubation, brief PEA arrest in CT requiring 1x round CPR. Initial head CT with questionable dense L MCA, but CTA revealed no LVO. EEG 9/17 with encephalopathy, no seizure activity. Chest CTA with small pleural effusion, rib fx in setting of CPR. ETT 9/16-9/20. PMH includes metastatic colon CA, HFrEF, VT s/p ICD, afib, HTN, DM, CAD, seizure disorder, recent intraabdominal abscess s/p drain.    PT Comments    Pt continues to have pain in R chest likely from fx rib but pt does relate that the pain he feels makes his anxious because prior to the fx pain like that would be a precursor to triggering his ICD. Despite pain pt is able to make solid progress towards his goals today, requiring minAx2 for bed mobility, min-modAx2 for transfers to [facility] and RW for ambulation in in hallway with min A and close chair follow. Pt refuses sitting OOB today. D/c plans remain appropriate at this time. PT will continue to follow acutely.    Recommendations for follow up therapy are one component of a multi-disciplinary discharge planning process, led by the attending physician.  Recommendations may be updated based on patient status, additional functional criteria and insurance authorization.  Follow Up Recommendations  Acute inpatient rehab (3hours/day)     Assistance Recommended at Discharge Frequent or constant Supervision/Assistance  Patient can return home with the following Assistance with cooking/housework;Direct supervision/assist for medications management;Direct supervision/assist for financial management;Assist for transportation;Help with stairs or ramp for entrance;Two people to help with walking and/or transfers;A lot of help with  bathing/dressing/bathroom   Equipment Recommendations  Other (comment) (defer to AIR)       Precautions / Restrictions Precautions Precautions: Fall;Other (comment) Precaution Comments: cortrak, Rt rib pain Restrictions Weight Bearing Restrictions: No     Mobility  Bed Mobility Overal bed mobility: Needs Assistance Bed Mobility: Supine to Sit     Supine to sit: Min assist Sit to supine: Min guard   General bed mobility comments: attempted to teach rolling for decreased rib torque, however pt refuses and with HoB elevated uses momentum and holding onto his r knee to bringing him self up to seated, with min A for postierior support while he scooted hips forward to EoB, min guard and increased cuing for getting back into bed    Transfers Overall transfer level: Needs assistance Equipment used: Rolling walker (2 wheels), 2 person hand held assist Transfers: Sit to/from Stand, Bed to chair/wheelchair/BSC Sit to Stand: Min assist, From elevated surface, +2 physical assistance Stand pivot transfers: Mod assist, +2 physical assistance         General transfer comment: minAx2 for power up and steadying in RW and then modA for support with stepping transfer to [facility]    Ambulation/Gait Ambulation/Gait assistance: Min assist, +2 safety/equipment Gait Distance (Feet): 40 Feet Assistive device: Rolling walker (2 wheels) Gait Pattern/deviations: Step-through pattern, Decreased weight shift to left Gait velocity: decreased Gait velocity interpretation: <1.31 ft/sec, indicative of household ambulator Pre-gait activities: marching in place General Gait Details: min A with close chair follow, pt reporting pain in L knee and had mild instability noted but no buckling, pt reports increasing R chest pain but states he thinks it is his ribs       Balance Overall balance assessment: Needs  assistance Sitting-balance support: No upper extremity supported, Feet supported Sitting  balance-Leahy Scale: Fair     Standing balance support: During functional activity, Reliant on assistive device for balance, Bilateral upper extremity supported Standing balance-Leahy Scale: Poor Standing balance comment: relies on UE support                            Cognition Arousal/Alertness: Awake/alert Behavior During Therapy: Flat affect Overall Cognitive Status: Impaired/Different from baseline Area of Impairment: Following commands, Safety/judgement, Orientation, Attention, Memory, Problem solving, Awareness                 Orientation Level: Disoriented to, Time Current Attention Level: Sustained   Following Commands: Follows one step commands with increased time, Follows one step commands inconsistently, Follows multi-step commands inconsistently, Follows multi-step commands with increased time   Awareness: Emergent Problem Solving: Slow processing, Difficulty sequencing, Requires verbal cues, Decreased initiation, Requires tactile cues General Comments: Pt laughing to mask cognitive deficits/questions throughout session, "this is trickery" when told the proposed plan for therapy. Thinks it is October and unable to state September despite contextual cues. Needs max encouragement, Repetition needed due to decreased initiation and slow processing. Episode of staring off into space with repetitive jaw smacking for 20 seconds at end of session, not responding during this time. RN notified.        Exercises General Exercises - Lower Extremity Hip Flexion/Marching: AROM, Both, 5 reps, Standing    General Comments General comments (skin integrity, edema, etc.): Pt reports fear of chest pain because he is afraid that it is cardiac related and that he will be "shocked" again, HR in 80s with ambulation      Pertinent Vitals/Pain Pain Assessment Pain Assessment: Faces Faces Pain Scale: Hurts whole lot Pain Location: right lower chest, rib pain, L hip Pain  Descriptors / Indicators: Aching, Guarding, Grimacing, Constant, Discomfort Pain Intervention(s): Limited activity within patient's tolerance, Monitored during session, Repositioned, Patient requesting pain meds-RN notified, RN gave pain meds during session     PT Goals (current goals can now be found in the care plan section) Acute Rehab PT Goals Patient Stated Goal: return home with help from son PT Goal Formulation: With patient Time For Goal Achievement: 09/16/22 Potential to Achieve Goals: Fair Progress towards PT goals: Progressing toward goals    Frequency    Min 4X/week      PT Plan Current plan remains appropriate       AM-PAC PT "6 Clicks" Mobility   Outcome Measure  Help needed turning from your back to your side while in a flat bed without using bedrails?: A Little Help needed moving from lying on your back to sitting on the side of a flat bed without using bedrails?: A Little Help needed moving to and from a bed to a chair (including a wheelchair)?: A Lot Help needed standing up from a chair using your arms (e.g., wheelchair or bedside chair)?: A Lot Help needed to walk in hospital room?: A Little Help needed climbing 3-5 steps with a railing? : Total 6 Click Score: 14    End of Session Equipment Utilized During Treatment: Gait belt Activity Tolerance: Patient tolerated treatment well Patient left: in bed;with call bell/phone within reach;with bed alarm set Nurse Communication: Mobility status PT Visit Diagnosis: Other abnormalities of gait and mobility (R26.89);Difficulty in walking, not elsewhere classified (R26.2);Other symptoms and signs involving the nervous system (Y85.027)     Time: 7412-8786  PT Time Calculation (min) (ACUTE ONLY): 29 min  Charges:  $Gait Training: 8-22 mins $Therapeutic Activity: 8-22 mins                     Lachlan Pelto B. Migdalia Dk PT, DPT Acute Rehabilitation Services Please use secure chat or  Call Office [phone]    [place] 09/08/2022, 11:57 AM

## 2022-09-09 ENCOUNTER — Other Ambulatory Visit (HOSPITAL_COMMUNITY): Payer: Self-pay

## 2022-09-09 DIAGNOSIS — J9601 Acute respiratory failure with hypoxia: Secondary | ICD-10-CM | POA: Diagnosis not present

## 2022-09-09 DIAGNOSIS — I469 Cardiac arrest, cause unspecified: Secondary | ICD-10-CM | POA: Diagnosis not present

## 2022-09-09 DIAGNOSIS — J690 Pneumonitis due to inhalation of food and vomit: Secondary | ICD-10-CM | POA: Diagnosis not present

## 2022-09-09 LAB — COMPREHENSIVE METABOLIC PANEL
ALT: 70 U/L — ABNORMAL HIGH (ref 0–44)
AST: 39 U/L (ref 15–41)
Albumin: 2.4 g/dL — ABNORMAL LOW (ref 3.5–5.0)
Alkaline Phosphatase: 141 U/L — ABNORMAL HIGH (ref 38–126)
Anion gap: 4 — ABNORMAL LOW (ref 5–15)
BUN: 12 mg/dL (ref 8–23)
CO2: 27 mmol/L (ref 22–32)
Calcium: 8.1 mg/dL — ABNORMAL LOW (ref 8.9–10.3)
Chloride: 107 mmol/L (ref 98–111)
Creatinine, Ser: 0.79 mg/dL (ref 0.61–1.24)
GFR, Estimated: >60 mL/min (ref >60)
Glucose, Bld: 113 mg/dL — ABNORMAL HIGH (ref 70–99)
Potassium: 4.4 mmol/L (ref 3.5–5.1)
Sodium: 138 mmol/L (ref 135–145)
Total Bilirubin: 0.6 mg/dL (ref 0.3–1.2)
Total Protein: 5.8 g/dL — ABNORMAL LOW (ref 6.5–8.1)

## 2022-09-09 LAB — CBC
HCT: 33 % — ABNORMAL LOW (ref 39.0–52.0)
Hemoglobin: 11.1 g/dL — ABNORMAL LOW (ref 13.0–17.0)
MCH: 32.6 pg (ref 26.0–34.0)
MCHC: 33.6 g/dL (ref 30.0–36.0)
MCV: 96.8 fL (ref 80.0–100.0)
Platelets: 209 10*3/uL (ref 150–400)
RBC: 3.41 MIL/uL — ABNORMAL LOW (ref 4.22–5.81)
RDW: 23.3 % — ABNORMAL HIGH (ref 11.5–15.5)
WBC: 11.5 10*3/uL — ABNORMAL HIGH (ref 4.0–10.5)
nRBC: 0.3 % — ABNORMAL HIGH (ref 0.0–0.2)

## 2022-09-09 LAB — GLUCOSE, CAPILLARY
Glucose-Capillary: 119 mg/dL — ABNORMAL HIGH (ref 70–99)
Glucose-Capillary: 177 mg/dL — ABNORMAL HIGH (ref 70–99)
Glucose-Capillary: 166 mg/dL — ABNORMAL HIGH (ref 70–99)
Glucose-Capillary: 168 mg/dL — ABNORMAL HIGH (ref 70–99)

## 2022-09-09 NOTE — Progress Notes (Signed)
  Progress Note   Patient: Gregory [name] DGU:[id number] DOB: 02/22/1961 DOA: 08/30/2022     10 DOS: the patient was seen and examined on 09/09/2022   Brief hospital course: 61yo who presented  after seizure and brief PEA arrest (known seizure disorder). Weaned extubated with full neurological recovery. HFrEF with ICD, restarted on HF meds, not decompensated. Cardiology was consulted and following  Assessment and Plan: PEA cardiac arrest -likely occurred following aspiration event following seizure -seems stable -Cardiology following. Discussed with Cardiology who reviewed updated echo, no significant change -Cardiology recs to cont treatment for CAD/CHF/VT with home meds  Seizures -Last seen by Neuro 9/20 with recs to continue keppra 1.5g bid at which point Neuro had signed off. Keppra dose remained at 1g bid, however for unclear reason -Care was transferred to [facility] on 9/22. On that day, pt was noted to have seizure lasting around 20 sec. -Made keppra dose changes as previously recommended by Neuro above. Also discussed with Neurology who recommended giving 1g IV keppra load x 1 -remains stable thus far, no seizures since 9/22 -Pt medically clear for CIR at this time  Aspiration PNA -completed course of abx  Severe protein calorie malnutrition -Cont PO and supplemental tube feeding as tolerated -Dietitian recs noted. Initially had required tube feeding over weekend.  -Nutritional status improved and NG feed d/c'd on 9/25  HFrEF, recurrent VT, Afib on eliquis -Seen by Cardiology who has signed off. As per above  Metastatic colon cancer -continue analgesia as needed -on Xeloda as outpt      Subjective: Without complaints. Eager to start rehab  Physical Exam: Vitals:   09/08/22 1634 09/08/22 2156 09/09/22 0547 09/09/22 0903  BP: 109/72 118/77 131/84 110/64  Pulse: 71 68 63 64  Resp: '16 18 18 18  '$ Temp: 98.5 F (36.9 C) 98.4 F (36.9 C) 98.4 F (36.9 C) 98 F  (36.7 C)  TempSrc: Oral Oral Oral   SpO2: 100% 95% 96% 95%  Weight:       General exam: Awake, laying in bed, in nad Respiratory system: Normal respiratory effort, no wheezing Cardiovascular system: regular rate, s1, s2 Gastrointestinal system: Soft, nondistended, positive BS Central nervous system: CN2-12 grossly intact, strength intact Extremities: Perfused, no clubbing Skin: Normal skin turgor, no notable skin lesions seen Psychiatry: Mood normal // no visual hallucinations   Data Reviewed:  Labs reviewed: Na 138, K 4.4, Cr 0.79, Hgb 11.1  Family Communication: Pt in room, family not at bedside  Disposition: Status is: Inpatient Remains inpatient appropriate because: Severity of illness  Planned Discharge Destination: Rehab    Author: Marylu Lund, MD 09/09/2022 2:25 PM  For on call review [email or web address].

## 2022-09-09 NOTE — Progress Notes (Signed)
Physical Therapy Treatment Patient Details Name: Gregory [name] MRN: [id number] DOB: [birth year] Today's Date: 09/09/2022   History of Present Illness Pt is a 61 y.o. male admitted 08/30/22 with AMS as code stroke. Pt decompensated in ED requiring intubation, brief PEA arrest in CT requiring 1x round CPR. Initial head CT with questionable dense L MCA, but CTA revealed no LVO. EEG 9/17 with encephalopathy, no seizure activity. Chest CTA with small pleural effusion, rib fx in setting of CPR. ETT 9/16-9/20. PMH includes metastatic colon CA, HFrEF, VT s/p ICD, afib, HTN, DM, CAD, seizure disorder, recent intraabdominal abscess s/p drain.    PT Comments    Pt initially refuses therapy due to just getting pain medication. Educated pt on pain medication management to be able to participate in therapy. Pt reluctantly agreeable. Pt provided with many techniques for reducing rib pain with movement, which pt politely refuses to try. Pt is making progress towards his goals today able to progress his gait pattern and tolerance. D/c plans remain appropriate at this time. PT will continue to follow acutely.    Recommendations for follow up therapy are one component of a multi-disciplinary discharge planning process, led by the attending physician.  Recommendations may be updated based on patient status, additional functional criteria and insurance authorization.  Follow Up Recommendations  Acute inpatient rehab (3hours/day)     Assistance Recommended at Discharge Frequent or constant Supervision/Assistance  Patient can return home with the following Assistance with cooking/housework;Direct supervision/assist for medications management;Direct supervision/assist for financial management;Assist for transportation;Help with stairs or ramp for entrance;Two people to help with walking and/or transfers;A lot of help with bathing/dressing/bathroom   Equipment Recommendations  Other (comment) (defer to AIR)        Precautions / Restrictions Precautions Precautions: Fall;Other (comment) Precaution Comments: Rt rib pain Restrictions Weight Bearing Restrictions: No     Mobility  Bed Mobility Overal bed mobility: Needs Assistance Bed Mobility: Supine to Sit     Supine to sit: Min assist Sit to supine: Min guard   General bed mobility comments: continues to prefer use of momentum and holding onto his R knee to come to seated EoB despite increased r rib pain, recommended bracing ribs with pillow but pt refuses. min guard for return to bed increased effort to return LE to bed and for bridging hips to middle of the bed    Transfers Overall transfer level: Needs assistance Equipment used: Rolling walker (2 wheels), 2 person hand held assist Transfers: Sit to/from Stand, Bed to chair/wheelchair/BSC Sit to Stand: Min assist, From elevated surface, +2 physical assistance           General transfer comment: minAx2 for power up and steadying in RW vc for steadying before ambulation    Ambulation/Gait Ambulation/Gait assistance: Min assist, +2 safety/equipment Gait Distance (Feet): 60 Feet Assistive device: Rolling walker (2 wheels) Gait Pattern/deviations: Step-through pattern, Decreased weight shift to left Gait velocity: decreased Gait velocity interpretation: <1.31 ft/sec, indicative of household ambulator   General Gait Details: pt continues to be concerned with pain in ribs as it radiates towards the center of his chest with movement         Balance Overall balance assessment: Needs assistance Sitting-balance support: No upper extremity supported, Feet supported Sitting balance-Leahy Scale: Fair     Standing balance support: During functional activity, Reliant on assistive device for balance, Bilateral upper extremity supported Standing balance-Leahy Scale: Poor Standing balance comment: relies on UE support  Cognition  Arousal/Alertness: Awake/alert Behavior During Therapy: Flat affect Overall Cognitive Status: Impaired/Different from baseline Area of Impairment: Following commands, Safety/judgement, Orientation, Attention, Memory, Problem solving, Awareness                 Orientation Level: Disoriented to, Time Current Attention Level: Sustained   Following Commands: Follows one step commands with increased time, Follows one step commands inconsistently, Follows multi-step commands inconsistently, Follows multi-step commands with increased time   Awareness: Emergent Problem Solving: Slow processing, Difficulty sequencing, Requires verbal cues, Decreased initiation, Requires tactile cues General Comments: pt cognition improving however continues to use humor as a foil           General Comments General comments (skin integrity, edema, etc.): HR in low 100s with ambulation, provided increased education on need for OOB to reduce risk of PNA especially with rib fx. Pt refuses today but reluctantly agrees to get up for lunch tomorrow      Pertinent Vitals/Pain Pain Assessment Pain Assessment: Faces Faces Pain Scale: Hurts whole lot Pain Location: right lower chest, rib pain, L hip Pain Descriptors / Indicators: Aching, Guarding, Grimacing, Constant, Discomfort Pain Intervention(s): Limited activity within patient's tolerance, Monitored during session, Repositioned                                          PT Goals (current goals can now be found in the care plan section) Acute Rehab PT Goals Patient Stated Goal: return home with help from son PT Goal Formulation: With patient Time For Goal Achievement: 09/16/22 Potential to Achieve Goals: Fair Progress towards PT goals: Progressing toward goals    Frequency    Min 4X/week      PT Plan Current plan remains appropriate       AM-PAC PT "6 Clicks" Mobility   Outcome Measure  Help needed turning from your back to  your side while in a flat bed without using bedrails?: A Little Help needed moving from lying on your back to sitting on the side of a flat bed without using bedrails?: A Little Help needed moving to and from a bed to a chair (including a wheelchair)?: A Lot Help needed standing up from a chair using your arms (e.g., wheelchair or bedside chair)?: A Lot Help needed to walk in hospital room?: A Little Help needed climbing 3-5 steps with a railing? : Total 6 Click Score: 14    End of Session Equipment Utilized During Treatment: Gait belt Activity Tolerance: Patient tolerated treatment well Patient left: in bed;with call bell/phone within reach;with bed alarm set Nurse Communication: Mobility status PT Visit Diagnosis: Other abnormalities of gait and mobility (R26.89);Difficulty in walking, not elsewhere classified (R26.2);Other symptoms and signs involving the nervous system (G31.517)     Time: 6160-7371 PT Time Calculation (min) (ACUTE ONLY): 28 min  Charges:  $Gait Training: 8-22 mins $Therapeutic Activity: 8-22 mins                     Talbert Trembath B. Migdalia Dk PT, DPT Acute Rehabilitation Services Please use secure chat or  Call Office [phone]    [place] 09/09/2022, 3:34 PM

## 2022-09-09 NOTE — Telephone Encounter (Signed)
Current admitted to hospital

## 2022-09-09 NOTE — Plan of Care (Signed)
  Problem: Education: Goal: Knowledge of General Education information will improve Description: Including pain rating scale, medication(s)/side effects and non-pharmacologic comfort measures Outcome: Progressing   Problem: Health Behavior/Discharge Planning: Goal: Ability to manage health-related needs will improve Outcome: Progressing   Problem: Clinical Measurements: Goal: Ability to maintain clinical measurements within normal limits will improve Outcome: Progressing Goal: Will remain free from infection Outcome: Progressing Goal: Diagnostic test results will improve Outcome: Progressing Goal: Respiratory complications will improve Outcome: Progressing Goal: Cardiovascular complication will be avoided Outcome: Progressing   Problem: Activity: Goal: Risk for activity intolerance will decrease Outcome: Progressing   Problem: Nutrition: Goal: Adequate nutrition will be maintained Outcome: Progressing   Problem: Coping: Goal: Level of anxiety will decrease Outcome: Progressing   Problem: Elimination: Goal: Will not experience complications related to bowel motility Outcome: Progressing Goal: Will not experience complications related to urinary retention Outcome: Progressing   Problem: Pain Managment: Goal: General experience of comfort will improve Outcome: Progressing   Problem: Safety: Goal: Ability to remain free from injury will improve Outcome: Progressing   Problem: Skin Integrity: Goal: Risk for impaired skin integrity will decrease Outcome: Progressing   Problem: Education: Goal: Ability to describe self-care measures that may prevent or decrease complications (Diabetes Survival Skills Education) will improve Outcome: Progressing Goal: Individualized Educational Video(s) Outcome: Progressing   Problem: Coping: Goal: Ability to adjust to condition or change in health will improve Outcome: Progressing   Problem: Fluid Volume: Goal: Ability to  maintain a balanced intake and output will improve Outcome: Progressing   Problem: Health Behavior/Discharge Planning: Goal: Ability to identify and utilize available resources and services will improve Outcome: Progressing Goal: Ability to manage health-related needs will improve Outcome: Progressing   Problem: Metabolic: Goal: Ability to maintain appropriate glucose levels will improve Outcome: Progressing   Problem: Nutritional: Goal: Maintenance of adequate nutrition will improve Outcome: Progressing Goal: Progress toward achieving an optimal weight will improve Outcome: Progressing   Problem: Skin Integrity: Goal: Risk for impaired skin integrity will decrease Outcome: Progressing   Problem: Tissue Perfusion: Goal: Adequacy of tissue perfusion will improve Outcome: Progressing   Problem: Education: Goal: Knowledge of disease or condition will improve Outcome: Progressing Goal: Knowledge of secondary prevention will improve (SELECT ALL) Outcome: Progressing Goal: Knowledge of patient specific risk factors will improve (INDIVIDUALIZE FOR PATIENT) Outcome: Progressing Goal: Individualized Educational Video(s) Outcome: Progressing   Problem: Coping: Goal: Will verbalize positive feelings about self Outcome: Progressing Goal: Will identify appropriate support needs Outcome: Progressing   Problem: Health Behavior/Discharge Planning: Goal: Ability to manage health-related needs will improve Outcome: Progressing   Problem: Self-Care: Goal: Ability to participate in self-care as condition permits will improve Outcome: Progressing Goal: Verbalization of feelings and concerns over difficulty with self-care will improve Outcome: Progressing Goal: Ability to communicate needs accurately will improve Outcome: Progressing   Problem: Nutrition: Goal: Risk of aspiration will decrease Outcome: Progressing Goal: Dietary intake will improve Outcome: Progressing    Problem: Intracerebral Hemorrhage Tissue Perfusion: Goal: Complications of Intracerebral Hemorrhage will be minimized Outcome: Progressing   Problem: Ischemic Stroke/TIA Tissue Perfusion: Goal: Complications of ischemic stroke/TIA will be minimized Outcome: Progressing

## 2022-09-09 NOTE — Plan of Care (Signed)
  Problem: Elimination: Goal: Will not experience complications related to bowel motility Outcome: Not Progressing   Problem: Elimination: Goal: Will not experience complications related to urinary retention Outcome: Not Progressing   Problem: Pain Managment: Goal: General experience of comfort will improve Outcome: Not Progressing   Problem: Clinical Measurements: Goal: Cardiovascular complication will be avoided Outcome: Not Progressing

## 2022-09-09 NOTE — Progress Notes (Signed)
Inpatient Rehabilitation Admissions Coordinator   I await insurance approval for possible [facility] admit. Patient is aware.  Danne Baxter, RN, MSN Rehab Admissions Coordinator [phone] 09/09/2022 12:59 PM

## 2022-09-10 DIAGNOSIS — I469 Cardiac arrest, cause unspecified: Secondary | ICD-10-CM | POA: Diagnosis not present

## 2022-09-10 LAB — GLUCOSE, CAPILLARY
Glucose-Capillary: 185 mg/dL — ABNORMAL HIGH (ref 70–99)
Glucose-Capillary: 167 mg/dL — ABNORMAL HIGH (ref 70–99)
Glucose-Capillary: 149 mg/dL — ABNORMAL HIGH (ref 70–99)
Glucose-Capillary: 112 mg/dL — ABNORMAL HIGH (ref 70–99)

## 2022-09-10 LAB — CBC
HCT: 35.7 % — ABNORMAL LOW (ref 39.0–52.0)
Hemoglobin: 12 g/dL — ABNORMAL LOW (ref 13.0–17.0)
MCH: 32.7 pg (ref 26.0–34.0)
MCHC: 33.6 g/dL (ref 30.0–36.0)
MCV: 97.3 fL (ref 80.0–100.0)
Platelets: 244 10*3/uL (ref 150–400)
RBC: 3.67 MIL/uL — ABNORMAL LOW (ref 4.22–5.81)
RDW: 23.5 % — ABNORMAL HIGH (ref 11.5–15.5)
WBC: 13.6 10*3/uL — ABNORMAL HIGH (ref 4.0–10.5)
nRBC: 0 % (ref 0.0–0.2)

## 2022-09-10 LAB — COMPREHENSIVE METABOLIC PANEL
ALT: 78 U/L — ABNORMAL HIGH (ref 0–44)
AST: 49 U/L — ABNORMAL HIGH (ref 15–41)
Albumin: 2.4 g/dL — ABNORMAL LOW (ref 3.5–5.0)
Alkaline Phosphatase: 147 U/L — ABNORMAL HIGH (ref 38–126)
Anion gap: 5 (ref 5–15)
BUN: 11 mg/dL (ref 8–23)
CO2: 25 mmol/L (ref 22–32)
Calcium: 8.2 mg/dL — ABNORMAL LOW (ref 8.9–10.3)
Chloride: 106 mmol/L (ref 98–111)
Creatinine, Ser: 0.83 mg/dL (ref 0.61–1.24)
GFR, Estimated: >60 mL/min (ref >60)
Glucose, Bld: 157 mg/dL — ABNORMAL HIGH (ref 70–99)
Potassium: 4.1 mmol/L (ref 3.5–5.1)
Sodium: 136 mmol/L (ref 135–145)
Total Bilirubin: 0.5 mg/dL (ref 0.3–1.2)
Total Protein: 5.9 g/dL — ABNORMAL LOW (ref 6.5–8.1)

## 2022-09-10 NOTE — Progress Notes (Signed)
Physical Therapy Treatment Patient Details Name: Gregory [name] MRN: [id number] DOB: [birth year] Today's Date: 09/10/2022   History of Present Illness Pt is a 61 y.o. male admitted 08/30/22 with AMS as code stroke. Pt decompensated in ED requiring intubation, brief PEA arrest in CT requiring 1x round CPR. Initial head CT with questionable dense L MCA, but CTA revealed no LVO. EEG 9/17 with encephalopathy, no seizure activity. Chest CTA with small pleural effusion, rib fx in setting of CPR. ETT 9/16-9/20. PMH includes metastatic colon CA, HFrEF, VT s/p ICD, afib, HTN, DM, CAD, seizure disorder, recent intraabdominal abscess s/p drain.    PT Comments    Pt sitting up in chair on entry, requesting to use urinal. After finished, pt request to return to bed. PT encouraged pt to ambulate prior to return to bed. Pt continues to complain of R rib pain, however is moving more fluidly today. Pt is currently modA for transfers and min-modA for ambulation with RW. D/c plans remain appropriate at this time. PT will continue to follow acutely.   Recommendations for follow up therapy are one component of a multi-disciplinary discharge planning process, led by the attending physician.  Recommendations may be updated based on patient status, additional functional criteria and insurance authorization.  Follow Up Recommendations  Acute inpatient rehab (3hours/day)     Assistance Recommended at Discharge Frequent or constant Supervision/Assistance  Patient can return home with the following Assistance with cooking/housework;Direct supervision/assist for medications management;Direct supervision/assist for financial management;Assist for transportation;Help with stairs or ramp for entrance;Two people to help with walking and/or transfers;A lot of help with bathing/dressing/bathroom   Equipment Recommendations  Other (comment) (defer to AIR)       Precautions / Restrictions Precautions Precautions:  Fall;Other (comment) Precaution Comments: Rt rib pain Restrictions Weight Bearing Restrictions: No     Mobility  Bed Mobility Overal bed mobility: Needs Assistance         Sit to supine: Min guard, HOB elevated   General bed mobility comments: min guard for pt to swing LE around in bed with HoB elevated    Transfers Overall transfer level: Needs assistance Equipment used: Rolling walker (2 wheels), 1 person hand held assist Transfers: Sit to/from Stand, Bed to chair/wheelchair/BSC Sit to Stand: From elevated surface, +2 physical assistance, Mod assist           General transfer comment: modA for power up from low recliner seat to RW, min A for face to face HHA power up but modA required for steadying while RN checked for evidence of pressure injury    Ambulation/Gait Ambulation/Gait assistance: Min assist, Mod assist Gait Distance (Feet): 60 Feet (+10) Assistive device: Rolling walker (2 wheels) Gait Pattern/deviations: Step-through pattern, Decreased weight shift to left Gait velocity: decreased Gait velocity interpretation: <1.31 ft/sec, indicative of household ambulator   General Gait Details: min A for steadying with ambulation, vc for upright posture, reduced grip on RW and relaxation through shoulders, modA for backward stepping with RW       Balance Overall balance assessment: Needs assistance Sitting-balance support: No upper extremity supported, Feet supported Sitting balance-Leahy Scale: Fair     Standing balance support: During functional activity, Reliant on assistive device for balance, Bilateral upper extremity supported Standing balance-Leahy Scale: Poor Standing balance comment: relies on UE support                            Cognition Arousal/Alertness: Awake/alert Behavior During Therapy:  Flat affect Overall Cognitive Status: Impaired/Different from baseline Area of Impairment: Following commands, Safety/judgement, Orientation,  Attention, Memory, Problem solving, Awareness                       Following Commands: Follows multi-step commands with increased time     Problem Solving: Slow processing, Difficulty sequencing, Requires verbal cues, Requires tactile cues General Comments: cognition continues to improve           General Comments General comments (skin integrity, edema, etc.): VSS on RA      Pertinent Vitals/Pain Pain Assessment Pain Assessment: Faces Faces Pain Scale: Hurts even more Pain Location: right lower chest, rib pain, L hip Pain Descriptors / Indicators: Aching, Guarding, Grimacing, Constant, Discomfort Pain Intervention(s): Limited activity within patient's tolerance, Monitored during session, Repositioned     PT Goals (current goals can now be found in the care plan section) Acute Rehab PT Goals Patient Stated Goal: return home with help from son PT Goal Formulation: With patient Time For Goal Achievement: 09/16/22 Potential to Achieve Goals: Fair Progress towards PT goals: Progressing toward goals    Frequency    Min 4X/week      PT Plan Current plan remains appropriate       AM-PAC PT "6 Clicks" Mobility   Outcome Measure  Help needed turning from your back to your side while in a flat bed without using bedrails?: A Little Help needed moving from lying on your back to sitting on the side of a flat bed without using bedrails?: A Little Help needed moving to and from a bed to a chair (including a wheelchair)?: A Lot Help needed standing up from a chair using your arms (e.g., wheelchair or bedside chair)?: A Lot Help needed to walk in hospital room?: A Little Help needed climbing 3-5 steps with a railing? : Total 6 Click Score: 14    End of Session Equipment Utilized During Treatment: Gait belt Activity Tolerance: Patient tolerated treatment well Patient left: in bed;with call bell/phone within reach;with bed alarm set Nurse Communication: Mobility  status PT Visit Diagnosis: Other abnormalities of gait and mobility (R26.89);Difficulty in walking, not elsewhere classified (R26.2);Other symptoms and signs involving the nervous system (S28.768)     Time: 1157-2620 PT Time Calculation (min) (ACUTE ONLY): 30 min  Charges:  $Gait Training: 8-22 mins $Therapeutic Activity: 8-22 mins                     Makaylen Thieme B. Migdalia Dk PT, DPT Acute Rehabilitation Services Please use secure chat or  Call Office [phone]    [place] 09/10/2022, 2:37 PM

## 2022-09-10 NOTE — Progress Notes (Addendum)
Inpatient Rehabilitation Admissions Coordinator   I have received insurance approval from [employer]/His workers comp case for admit Thursday. His Christella Scheuermann policy is secondary for which I clarified on Friday 9/22. I will follow up in the morning to admit. Family made aware.  Danne Baxter, RN, MSN Rehab Admissions Coordinator [phone] 09/10/2022 4:48 PM

## 2022-09-10 NOTE — Progress Notes (Signed)
Mobility Specialist Progress Note:   09/10/22 1230  Mobility  Activity Transferred from bed to chair  Level of Assistance Minimal assist, patient does 75% or more  Assistive Device Front wheel walker  Distance Ambulated (ft) 5 ft  Activity Response Tolerated well  $Mobility charge 1 Mobility   Pt eager for mobility session, agreeable to transfer to chair. Required minA to stand and ambulate to chair. Left with all needs met.     Acute Rehab Secure Chat or Office Phone: [phone]  

## 2022-09-10 NOTE — Progress Notes (Signed)
  Progress Note   Patient: Gregory [name] WGN:[id number] DOB: Mar 16, 1961 DOA: 08/30/2022     11 DOS: the patient was seen and examined on 09/10/2022   Brief hospital course: 61yo who presented  after seizure and brief PEA arrest (known seizure disorder). Weaned extubated with full neurological recovery. HFrEF with ICD, restarted on HF meds, not decompensated. Cardiology was consulted and following  Assessment and Plan: PEA cardiac arrest -following aspiration event following seizure -seems stable -Cardiology following. Discussed with Cardiology who reviewed updated echo, no significant change -Cardiology recs to cont treatment for CAD/CHF/VT with home meds  Seizures -Last seen by Neuro 9/20 with recs to continue keppra 1.5g bid at which point Neuro had signed off. Keppra dose adjusted to 1.5 g twice daily -remains stable thus far, no seizures since 9/22 -Pt medically clear for CIR at this time  Aspiration PNA -completed course of abx  Severe protein calorie malnutrition -Cont PO and supplemental tube feeding as tolerated -Initially on tube feeds-NG feed d/c'd on 9/25  HFrEF, recurrent VT, Afib on eliquis -Seen by Cardiology who has signed off. -Continue amiodarone 400 daily, metoprolol 50 twice daily -Heart failure seems compensated-continue Lasix 40 orally atorvastatin 80 Entresto 1 twice daily  Metastatic colon cancer stage III/IV pT3, N2B with indeterminate pancreatic and adrenal lesions and liver mets -continue analgesia as needed -on Xeloda as outpt -We will need repeat scan after 4 cycles of Xeloda      Subjective: Doing well just finished lunch and no complaints Main issue is pain in his right upper abdomen and lower chest  Physical Exam: Vitals:   09/09/22 1651 09/09/22 2016 09/10/22 0545 09/10/22 0933  BP: 113/60 129/83 109/67 129/84  Pulse: 66 77 81 65  Resp: '18 18 18 18  '$ Temp: 98.2 F (36.8 C) 98.4 F (36.9 C) 99.1 F (37.3 C) 98.1 F (36.7 C)   TempSrc: Oral Oral Oral Oral  SpO2: 98% 95% 98% 96%  Weight:       Awake coherent no distress Progress no icterus no pallor Chest is clear no added sound Abdomen is slightly tender in the right upper quadrant ROM is intact No lower extremity edema Neuro is intact 136 potassium 4.1   Data Reviewed:  Sodium 138, K 4.1 Bun/Cr 11/0.83 WBC 13.6, Hemeglobin 12.0, PLT 244 AST/ALT 49/78  Family Communication: Pt in room, family not at bedside  Disposition: Status is: Inpatient Remains inpatient appropriate because: Severity of illness  Planned Discharge Destination: Rehab    Author: Nita Sells, MD 09/10/2022 1:59 PM  For on call review [email or web address].

## 2022-09-10 NOTE — Plan of Care (Signed)
  Problem: Activity: Goal: Risk for activity intolerance will decrease Outcome: Not Progressing   Problem: Pain Managment: Goal: General experience of comfort will improve Outcome: Not Progressing   Problem: Safety: Goal: Ability to remain free from injury will improve Outcome: Not Progressing

## 2022-09-10 NOTE — Plan of Care (Signed)
  Problem: Education: Goal: Knowledge of General Education information will improve Description: Including pain rating scale, medication(s)/side effects and non-pharmacologic comfort measures Outcome: Progressing   Problem: Health Behavior/Discharge Planning: Goal: Ability to manage health-related needs will improve Outcome: Progressing   Problem: Clinical Measurements: Goal: Ability to maintain clinical measurements within normal limits will improve Outcome: Progressing Goal: Will remain free from infection Outcome: Progressing Goal: Diagnostic test results will improve Outcome: Progressing Goal: Respiratory complications will improve Outcome: Progressing Goal: Cardiovascular complication will be avoided Outcome: Progressing   Problem: Activity: Goal: Risk for activity intolerance will decrease Outcome: Progressing   Problem: Nutrition: Goal: Adequate nutrition will be maintained Outcome: Progressing   Problem: Coping: Goal: Level of anxiety will decrease Outcome: Progressing   Problem: Elimination: Goal: Will not experience complications related to bowel motility Outcome: Progressing Goal: Will not experience complications related to urinary retention Outcome: Progressing   Problem: Pain Managment: Goal: General experience of comfort will improve Outcome: Progressing   Problem: Safety: Goal: Ability to remain free from injury will improve Outcome: Progressing   Problem: Skin Integrity: Goal: Risk for impaired skin integrity will decrease Outcome: Progressing   Problem: Education: Goal: Ability to describe self-care measures that may prevent or decrease complications (Diabetes Survival Skills Education) will improve Outcome: Progressing Goal: Individualized Educational Video(s) Outcome: Progressing   Problem: Coping: Goal: Ability to adjust to condition or change in health will improve Outcome: Progressing   Problem: Fluid Volume: Goal: Ability to  maintain a balanced intake and output will improve Outcome: Progressing   Problem: Health Behavior/Discharge Planning: Goal: Ability to identify and utilize available resources and services will improve Outcome: Progressing Goal: Ability to manage health-related needs will improve Outcome: Progressing   Problem: Metabolic: Goal: Ability to maintain appropriate glucose levels will improve Outcome: Progressing   Problem: Nutritional: Goal: Maintenance of adequate nutrition will improve Outcome: Progressing Goal: Progress toward achieving an optimal weight will improve Outcome: Progressing   Problem: Skin Integrity: Goal: Risk for impaired skin integrity will decrease Outcome: Progressing   Problem: Tissue Perfusion: Goal: Adequacy of tissue perfusion will improve Outcome: Progressing   Problem: Education: Goal: Knowledge of disease or condition will improve Outcome: Progressing Goal: Knowledge of secondary prevention will improve (SELECT ALL) Outcome: Progressing Goal: Knowledge of patient specific risk factors will improve (INDIVIDUALIZE FOR PATIENT) Outcome: Progressing Goal: Individualized Educational Video(s) Outcome: Progressing   Problem: Coping: Goal: Will verbalize positive feelings about self Outcome: Progressing Goal: Will identify appropriate support needs Outcome: Progressing   Problem: Health Behavior/Discharge Planning: Goal: Ability to manage health-related needs will improve Outcome: Progressing   Problem: Self-Care: Goal: Ability to participate in self-care as condition permits will improve Outcome: Progressing Goal: Verbalization of feelings and concerns over difficulty with self-care will improve Outcome: Progressing Goal: Ability to communicate needs accurately will improve Outcome: Progressing   Problem: Nutrition: Goal: Risk of aspiration will decrease Outcome: Progressing Goal: Dietary intake will improve Outcome: Progressing    Problem: Intracerebral Hemorrhage Tissue Perfusion: Goal: Complications of Intracerebral Hemorrhage will be minimized Outcome: Progressing   Problem: Ischemic Stroke/TIA Tissue Perfusion: Goal: Complications of ischemic stroke/TIA will be minimized Outcome: Progressing

## 2022-09-10 NOTE — Progress Notes (Signed)
OT Cancellation Note  Patient Details Name: Lesslie Mckeehan MRN: [id number] DOB: May 09, 1961   Cancelled Treatment:    Reason Eval/Treat Not Completed: Patient declined, no reason specified.  Patient stating just returned to bed.    Versie Fleener D Corrie Reder 09/10/2022, 1:38 PM 09/10/2022  RP, OTR/L  Acute Rehabilitation Services  Office:  [phone]

## 2022-09-11 ENCOUNTER — Inpatient Hospital Stay (HOSPITAL_COMMUNITY)
Admission: RE | Admit: 2022-09-11 | Discharge: 2022-09-18 | DRG: 945 | Disposition: A | Discharge status: Home / Self Care | Payer: Worker's Compensation | Source: Intra-hospital | Attending: Physical Medicine & Rehabilitation | Admitting: Physical Medicine & Rehabilitation

## 2022-09-11 ENCOUNTER — Other Ambulatory Visit: Payer: Self-pay

## 2022-09-11 DIAGNOSIS — I2510 Atherosclerotic heart disease of native coronary artery without angina pectoris: Secondary | ICD-10-CM | POA: Diagnosis present

## 2022-09-11 DIAGNOSIS — Z87891 Personal history of nicotine dependence: Secondary | ICD-10-CM

## 2022-09-11 DIAGNOSIS — R5381 Other malaise: Principal | ICD-10-CM | POA: Diagnosis present

## 2022-09-11 DIAGNOSIS — I4891 Unspecified atrial fibrillation: Secondary | ICD-10-CM | POA: Diagnosis not present

## 2022-09-11 DIAGNOSIS — I472 Ventricular tachycardia, unspecified: Secondary | ICD-10-CM | POA: Diagnosis present

## 2022-09-11 DIAGNOSIS — Z741 Need for assistance with personal care: Secondary | ICD-10-CM | POA: Diagnosis present

## 2022-09-11 DIAGNOSIS — C189 Malignant neoplasm of colon, unspecified: Secondary | ICD-10-CM | POA: Diagnosis present

## 2022-09-11 DIAGNOSIS — Z66 Do not resuscitate: Secondary | ICD-10-CM | POA: Diagnosis present

## 2022-09-11 DIAGNOSIS — R531 Weakness: Secondary | ICD-10-CM | POA: Diagnosis present

## 2022-09-11 DIAGNOSIS — I255 Ischemic cardiomyopathy: Secondary | ICD-10-CM | POA: Diagnosis present

## 2022-09-11 DIAGNOSIS — I5022 Chronic systolic (congestive) heart failure: Secondary | ICD-10-CM

## 2022-09-11 DIAGNOSIS — I480 Paroxysmal atrial fibrillation: Secondary | ICD-10-CM | POA: Diagnosis present

## 2022-09-11 DIAGNOSIS — I5042 Chronic combined systolic (congestive) and diastolic (congestive) heart failure: Secondary | ICD-10-CM

## 2022-09-11 DIAGNOSIS — C787 Secondary malignant neoplasm of liver and intrahepatic bile duct: Secondary | ICD-10-CM | POA: Diagnosis present

## 2022-09-11 DIAGNOSIS — Z6824 Body mass index (BMI) 24.0-24.9, adult: Secondary | ICD-10-CM

## 2022-09-11 DIAGNOSIS — E440 Moderate protein-calorie malnutrition: Secondary | ICD-10-CM | POA: Diagnosis present

## 2022-09-11 DIAGNOSIS — R569 Unspecified convulsions: Secondary | ICD-10-CM

## 2022-09-11 DIAGNOSIS — Z79631 Long term (current) use of antimetabolite agent: Secondary | ICD-10-CM

## 2022-09-11 DIAGNOSIS — Z79899 Other long term (current) drug therapy: Secondary | ICD-10-CM

## 2022-09-11 DIAGNOSIS — F419 Anxiety disorder, unspecified: Secondary | ICD-10-CM | POA: Diagnosis present

## 2022-09-11 DIAGNOSIS — E119 Type 2 diabetes mellitus without complications: Secondary | ICD-10-CM

## 2022-09-11 DIAGNOSIS — I110 Hypertensive heart disease with heart failure: Secondary | ICD-10-CM | POA: Diagnosis present

## 2022-09-11 DIAGNOSIS — Z7901 Long term (current) use of anticoagulants: Secondary | ICD-10-CM

## 2022-09-11 DIAGNOSIS — Z8674 Personal history of sudden cardiac arrest: Secondary | ICD-10-CM | POA: Diagnosis not present

## 2022-09-11 DIAGNOSIS — N529 Male erectile dysfunction, unspecified: Secondary | ICD-10-CM | POA: Diagnosis present

## 2022-09-11 DIAGNOSIS — Z95810 Presence of automatic (implantable) cardiac defibrillator: Secondary | ICD-10-CM | POA: Diagnosis not present

## 2022-09-11 DIAGNOSIS — D72829 Elevated white blood cell count, unspecified: Secondary | ICD-10-CM | POA: Diagnosis present

## 2022-09-11 DIAGNOSIS — R0789 Other chest pain: Secondary | ICD-10-CM | POA: Diagnosis present

## 2022-09-11 DIAGNOSIS — Z808 Family history of malignant neoplasm of other organs or systems: Secondary | ICD-10-CM

## 2022-09-11 DIAGNOSIS — D649 Anemia, unspecified: Secondary | ICD-10-CM | POA: Diagnosis not present

## 2022-09-11 DIAGNOSIS — Z8249 Family history of ischemic heart disease and other diseases of the circulatory system: Secondary | ICD-10-CM

## 2022-09-11 DIAGNOSIS — G4730 Sleep apnea, unspecified: Secondary | ICD-10-CM | POA: Diagnosis present

## 2022-09-11 DIAGNOSIS — I469 Cardiac arrest, cause unspecified: Secondary | ICD-10-CM | POA: Diagnosis present

## 2022-09-11 DIAGNOSIS — G40909 Epilepsy, unspecified, not intractable, without status epilepticus: Secondary | ICD-10-CM | POA: Diagnosis present

## 2022-09-11 DIAGNOSIS — S2249XD Multiple fractures of ribs, unspecified side, subsequent encounter for fracture with routine healing: Secondary | ICD-10-CM | POA: Diagnosis not present

## 2022-09-11 DIAGNOSIS — Z7984 Long term (current) use of oral hypoglycemic drugs: Secondary | ICD-10-CM

## 2022-09-11 DIAGNOSIS — E785 Hyperlipidemia, unspecified: Secondary | ICD-10-CM | POA: Diagnosis present

## 2022-09-11 DIAGNOSIS — Z9049 Acquired absence of other specified parts of digestive tract: Secondary | ICD-10-CM

## 2022-09-11 DIAGNOSIS — Z7902 Long term (current) use of antithrombotics/antiplatelets: Secondary | ICD-10-CM

## 2022-09-11 DIAGNOSIS — E1165 Type 2 diabetes mellitus with hyperglycemia: Secondary | ICD-10-CM | POA: Diagnosis not present

## 2022-09-11 LAB — GLUCOSE, CAPILLARY
Glucose-Capillary: 119 mg/dL — ABNORMAL HIGH (ref 70–99)
Glucose-Capillary: 271 mg/dL — ABNORMAL HIGH (ref 70–99)
Glucose-Capillary: 97 mg/dL (ref 70–99)
Glucose-Capillary: 216 mg/dL — ABNORMAL HIGH (ref 70–99)

## 2022-09-11 MED ORDER — AMIODARONE HCL 200 MG PO TABS
400.0000 mg | ORAL_TABLET | Freq: Every day | ORAL | Status: DC
  Administered 2022-09-12 – 2022-09-18 (×7): 400 mg via ORAL
  Filled 2022-09-11 (×8): qty 2

## 2022-09-11 MED ORDER — BISACODYL 10 MG RE SUPP: 10.0000 mg | Freq: Every day | RECTAL | Status: DC | PRN

## 2022-09-11 MED ORDER — ACETAMINOPHEN 325 MG PO TABS
325.0000 mg | ORAL_TABLET | ORAL | Status: DC | PRN
  Administered 2022-09-14 – 2022-09-17 (×5): 650 mg via ORAL
  Filled 2022-09-11 (×6): qty 2

## 2022-09-11 MED ORDER — OXYCODONE HCL 5 MG PO TABS
10.0000 mg | ORAL_TABLET | ORAL | Status: DC | PRN
  Administered 2022-09-11 – 2022-09-17 (×23): 10 mg via ORAL
  Filled 2022-09-11 (×26): qty 2

## 2022-09-11 MED ORDER — INSULIN ASPART 100 UNIT/ML IJ SOLN
0.0000 [IU] | Freq: Three times a day (TID) | INTRAMUSCULAR | Status: DC
  Administered 2022-09-13 (×2): 3 [IU] via SUBCUTANEOUS
  Administered 2022-09-14: 2 [IU] via SUBCUTANEOUS
  Administered 2022-09-15: 3 [IU] via SUBCUTANEOUS
  Administered 2022-09-15: 2 [IU] via SUBCUTANEOUS
  Administered 2022-09-16 – 2022-09-17 (×4): 3 [IU] via SUBCUTANEOUS
  Administered 2022-09-17 – 2022-09-18 (×2): 2 [IU] via SUBCUTANEOUS

## 2022-09-11 MED ORDER — PROCHLORPERAZINE MALEATE 5 MG PO TABS: 5.0000 mg | ORAL_TABLET | Freq: Four times a day (QID) | ORAL | Status: DC | PRN

## 2022-09-11 MED ORDER — DAPAGLIFLOZIN PROPANEDIOL 10 MG PO TABS
10.0000 mg | ORAL_TABLET | Freq: Every day | ORAL | Status: DC
  Administered 2022-09-12 – 2022-09-18 (×7): 10 mg via ORAL
  Filled 2022-09-11 (×7): qty 1

## 2022-09-11 MED ORDER — AMIODARONE HCL 400 MG PO TABS: 400.0000 mg | ORAL_TABLET | Freq: Every day | ORAL | Status: DC

## 2022-09-11 MED ORDER — ALUM & MAG HYDROXIDE-SIMETH 200-200-20 MG/5ML PO SUSP: 30.0000 mL | ORAL | Status: DC | PRN

## 2022-09-11 MED ORDER — METOPROLOL SUCCINATE ER 50 MG PO TB24
50.0000 mg | ORAL_TABLET | Freq: Two times a day (BID) | ORAL | Status: DC
  Administered 2022-09-11 – 2022-09-18 (×14): 50 mg via ORAL
  Filled 2022-09-11 (×14): qty 1

## 2022-09-11 MED ORDER — FLEET ENEMA 7-19 GM/118ML RE ENEM: 1.0000 | ENEMA | Freq: Once | RECTAL | Status: DC | PRN

## 2022-09-11 MED ORDER — EZETIMIBE 10 MG PO TABS
10.0000 mg | ORAL_TABLET | Freq: Every day | ORAL | Status: DC
  Administered 2022-09-12 – 2022-09-18 (×7): 10 mg via ORAL
  Filled 2022-09-11 (×7): qty 1

## 2022-09-11 MED ORDER — LEVOTHYROXINE SODIUM 50 MCG PO TABS
50.0000 ug | ORAL_TABLET | Freq: Every day | ORAL | Status: DC
  Administered 2022-09-12 – 2022-09-17 (×6): 50 ug via ORAL
  Filled 2022-09-11 (×7): qty 1

## 2022-09-11 MED ORDER — LEVETIRACETAM 750 MG PO TABS
1500.0000 mg | ORAL_TABLET | Freq: Two times a day (BID) | ORAL | Status: DC
  Administered 2022-09-11 – 2022-09-18 (×14): 1500 mg via ORAL
  Filled 2022-09-11 (×14): qty 2

## 2022-09-11 MED ORDER — PANTOPRAZOLE SODIUM 40 MG PO TBEC
40.0000 mg | DELAYED_RELEASE_TABLET | Freq: Every day | ORAL | Status: DC
  Administered 2022-09-12 – 2022-09-18 (×7): 40 mg via ORAL
  Filled 2022-09-11 (×7): qty 1

## 2022-09-11 MED ORDER — ADULT MULTIVITAMIN W/MINERALS CH
1.0000 | ORAL_TABLET | Freq: Every day | ORAL | Status: DC
  Administered 2022-09-12 – 2022-09-18 (×7): 1 via ORAL
  Filled 2022-09-11 (×7): qty 1

## 2022-09-11 MED ORDER — CLOPIDOGREL BISULFATE 75 MG PO TABS
75.0000 mg | ORAL_TABLET | Freq: Every day | ORAL | Status: DC
  Administered 2022-09-12 – 2022-09-18 (×7): 75 mg via ORAL
  Filled 2022-09-11 (×7): qty 1

## 2022-09-11 MED ORDER — LEVOTHYROXINE SODIUM 50 MCG PO TABS: 50.0000 ug | ORAL_TABLET | Freq: Every day | ORAL | Status: DC

## 2022-09-11 MED ORDER — PROCHLORPERAZINE EDISYLATE 10 MG/2ML IJ SOLN: 5.0000 mg | Freq: Four times a day (QID) | INTRAMUSCULAR | Status: DC | PRN

## 2022-09-11 MED ORDER — DIPHENHYDRAMINE HCL 12.5 MG/5ML PO ELIX: 12.5000 mg | ORAL_SOLUTION | Freq: Four times a day (QID) | ORAL | Status: DC | PRN

## 2022-09-11 MED ORDER — GUAIFENESIN-DM 100-10 MG/5ML PO SYRP: 5.0000 mL | ORAL_SOLUTION | Freq: Four times a day (QID) | ORAL | Status: DC | PRN

## 2022-09-11 MED ORDER — LEVETIRACETAM 750 MG PO TABS: 1500.0000 mg | ORAL_TABLET | Freq: Two times a day (BID) | ORAL | Status: DC

## 2022-09-11 MED ORDER — FUROSEMIDE 40 MG PO TABS
40.0000 mg | ORAL_TABLET | Freq: Every day | ORAL | Status: DC
  Administered 2022-09-12 – 2022-09-18 (×7): 40 mg via ORAL
  Filled 2022-09-11 (×7): qty 1

## 2022-09-11 MED ORDER — LIDOCAINE 4 % EX CREA: TOPICAL_CREAM | Freq: Three times a day (TID) | CUTANEOUS | Status: DC | PRN

## 2022-09-11 MED ORDER — POLYETHYLENE GLYCOL 3350 17 G PO PACK
17.0000 g | PACK | Freq: Every day | ORAL | Status: DC | PRN
  Administered 2022-09-15: 17 g via ORAL
  Filled 2022-09-11: qty 1

## 2022-09-11 MED ORDER — POTASSIUM CHLORIDE CRYS ER 10 MEQ PO TBCR
10.0000 meq | EXTENDED_RELEASE_TABLET | Freq: Every day | ORAL | Status: DC
  Administered 2022-09-12 – 2022-09-18 (×7): 10 meq via ORAL
  Filled 2022-09-11 (×7): qty 1

## 2022-09-11 MED ORDER — DICLOFENAC SODIUM 1 % EX GEL
2.0000 g | Freq: Four times a day (QID) | CUTANEOUS | Status: DC
  Filled 2022-09-11: qty 100

## 2022-09-11 MED ORDER — SACUBITRIL-VALSARTAN 97-103 MG PO TABS
1.0000 | ORAL_TABLET | Freq: Two times a day (BID) | ORAL | Status: DC
  Administered 2022-09-11 – 2022-09-18 (×14): 1 via ORAL
  Filled 2022-09-11 (×17): qty 1

## 2022-09-11 MED ORDER — LORAZEPAM 2 MG/ML IJ SOLN: 4.0000 mg | INTRAMUSCULAR | Status: DC | PRN

## 2022-09-11 MED ORDER — ZOLPIDEM TARTRATE 5 MG PO TABS
5.0000 mg | ORAL_TABLET | Freq: Every evening | ORAL | Status: DC | PRN
  Administered 2022-09-11 – 2022-09-18 (×7): 5 mg via ORAL
  Filled 2022-09-11 (×7): qty 1

## 2022-09-11 MED ORDER — INSULIN ASPART 100 UNIT/ML IJ SOLN
0.0000 [IU] | Freq: Every day | INTRAMUSCULAR | Status: DC
  Administered 2022-09-11 – 2022-09-13 (×2): 2 [IU] via SUBCUTANEOUS

## 2022-09-11 MED ORDER — ATORVASTATIN CALCIUM 80 MG PO TABS
80.0000 mg | ORAL_TABLET | Freq: Every evening | ORAL | Status: DC
  Administered 2022-09-11 – 2022-09-17 (×7): 80 mg via ORAL
  Filled 2022-09-11 (×7): qty 1

## 2022-09-11 MED ORDER — PROCHLORPERAZINE 25 MG RE SUPP: 12.5000 mg | Freq: Four times a day (QID) | RECTAL | Status: DC | PRN

## 2022-09-11 MED ORDER — APIXABAN 5 MG PO TABS
5.0000 mg | ORAL_TABLET | Freq: Two times a day (BID) | ORAL | Status: DC
  Administered 2022-09-11 – 2022-09-18 (×14): 5 mg via ORAL
  Filled 2022-09-11 (×14): qty 1

## 2022-09-11 NOTE — Progress Notes (Signed)
INPATIENT REHABILITATION ADMISSION NOTE   Arrival Method: Bed     Mental Orientation: oriented X4   Assessment: done   Skin: assessed   IV'S: present on left Forearm   Pain: medicated.    Tubes and Drains: none   Safety Measures: reviewed with pt   Vital Signs: done   Height and Weight: done   Rehab Orientation: done   Family: notified by pt.     Notes:  done  Gerald Stabs, RN

## 2022-09-11 NOTE — Progress Notes (Signed)
Inpatient Rehabilitation Admission Medication Review by a Pharmacist  A complete drug regimen review was completed for this patient to identify any potential clinically significant medication issues.  High Risk Drug Classes Is patient taking? Indication by Medication  Antipsychotic Yes Compazine- N/V  Anticoagulant Yes Apixaban- AF  Antibiotic No   Opioid Yes OxyIR- acute pain  Antiplatelet Yes Plavix- CVA ppx  Hypoglycemics/insulin Yes Insulin, Farxiga- T2DM  Vasoactive Medication Yes Pacerone, lasix, toprol xl, entresto- HF / AF rate control  Chemotherapy No   Other Yes Synthroid- hypothyroidism Protonix- GERD Keppra- seizure ppx Zetia / Lipitor- HLD Ativan- seizure Ambien- sleep      Type of Medication Issue Identified Description of Issue Recommendation(s)  Drug Interaction(s) (clinically significant)     Duplicate Therapy     Allergy     No Medication Administration End Date     Incorrect Dose     Additional Drug Therapy Needed     Significant med changes from prior encounter (inform family/care partners about these prior to discharge).    Other       Clinically significant medication issues were identified that warrant physician communication and completion of prescribed/recommended actions by midnight of the next day:  No  Time spent performing this drug regimen review (minutes):  30   Rini Moffit BS, PharmD, BCPS Clinical Pharmacist 09/11/2022 4:53 PM  Contact: [phone] after 3 PM  "Be curious, not judgmental..." -Jamal Maes

## 2022-09-11 NOTE — Progress Notes (Signed)
Inpatient Rehabilitation Admissions Coordinator   I have insurance approval and CIR bed to admit him to today. Acute team, Dr Verlon Au and [facility] made aware. I will make the arrangements to admit today.  Danne Baxter, RN, MSN Rehab Admissions Coordinator [phone] 09/11/2022 9:58 AM

## 2022-09-11 NOTE — H&P (Signed)
Physical Medicine and Rehabilitation Admission H&P        Chief Complaint  Patient presents with   Functional deficits due to PEA arrest      HPI: Gregory [name] is a 61 year old male with history of HTN, VT/ICD s/p AICD, PAF: Seizure disorder, 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  He became hypoxic requiring intubation and then became pulseless requiring 1 round of CPR with epi as well as high doses of epi.  CT head question hyperdense left MCA branch but CTA was negative for LVO.  EEG showed severe diffuse encephalopathy without seizures or epileptiform discharges.  Dr. Cheral Marker question anoxic brain injury and possibly brain mets and MRI for work-up.   CTA chest negative for PE and aspiration pneumonia treated with unasyn.  He was extubated without difficulty and cardiology consulted for input on right-sided chest pain which was felt to be costochondral consistent with post CPR injury. Mexiletine as discontinued, he was loaded with amiodarone and tapered to 400 mg dialy There was no need for coronary catheterization and ICD interrogated to make sure the seizure was not due to arrhythmia.  ICD download did not show episodes of sustained or non-sustained V. Tach.  PT/OT has been working with patient who continues to be limited by chest wall pain, weakness and debility. CIR recommended due to functional decline.      Ribs killing him- from CPR. Using oxycodone and lidocaine patches Pt feels cognition back to baseline- admits poor memory "at baseline".  LBM yesterday   Review of Systems  Constitutional:  Negative for chills and fever.  Eyes:  Negative for blurred vision and double vision.  Respiratory:  Negative for cough and shortness of breath.   Cardiovascular:  Positive for chest  pain (lower right rib pain-- a little pain). Negative for palpitations.  Gastrointestinal:  Negative for abdominal pain, heartburn and nausea.  Genitourinary:  Negative for frequency and urgency.  Musculoskeletal:  Negative for myalgias.  Skin:  Negative for rash.  Neurological:  Positive for weakness. Negative for dizziness and headaches.  Psychiatric/Behavioral:  The patient has insomnia (chronic).   All other systems reviewed and are negative.         Past Medical History:  Diagnosis Date   AICD (automatic cardioverter/defibrillator) present 2005   CAD (coronary artery disease) 12/01/2013   Chronic combined systolic and diastolic CHF, NYHA class 1 ([place]) 12/01/2013   Erectile dysfunction 12/01/2013   HTN (hypertension) 12/01/2013   Hyperlipidemia 12/01/2013   Ischemic cardiomyopathy 12/01/2013   Presence of permanent cardiac pacemaker     Sleep apnea             Past Surgical History:  Procedure Laterality Date   ACHILLES TENDON REPAIR        lft foot   BIOPSY   04/16/2022    Procedure: BIOPSY;  Surgeon: Yetta Flock, MD;  Location: [facility];  Service: Gastroenterology;;   CARDIAC CATHETERIZATION N/A 05/05/2016    Procedure: Left Heart Cath and Coronary Angiography;  Surgeon: Leonie Man, MD;  Location: [place] [facility];  Service: Cardiovascular;  Laterality: N/A;   CARDIAC DEFIBRILLATOR PLACEMENT       CARDIOVERSION N/A 09/13/2020    Procedure: CARDIOVERSION;  Surgeon: Werner Lean, MD;  Location:  [place] [facility];  Service: Cardiovascular;  Laterality: N/A;   CARDIOVERSION N/A 09/19/2020    Procedure: CARDIOVERSION;  Surgeon: [name], MD;  Location: [facility];  Service: Cardiovascular;  Laterality: N/A;   COLON RESECTION N/A 04/18/2022    Procedure: HAND ASSISTED LAPAROSCOPIC RIGHT COLON RESECTION;  Surgeon: Felicie Morn, MD;  Location: [place];  Service: General;  Laterality: N/A;   COLONOSCOPY WITH PROPOFOL N/A 04/16/2022    Procedure:  COLONOSCOPY WITH PROPOFOL;  Surgeon: Yetta Flock, MD;  Location: [facility];  Service: Gastroenterology;  Laterality: N/A;   EP IMPLANTABLE DEVICE N/A 12/19/2016    Procedure: ICD Generator Changeout;  Surgeon: [name], MD;  Location: [place] [facility];  Service: Cardiovascular;  Laterality: N/A;   ESOPHAGOGASTRODUODENOSCOPY N/A 04/16/2022    Procedure: ESOPHAGOGASTRODUODENOSCOPY (EGD);  Surgeon: Yetta Flock, MD;  Location: [facility] ENDOSCOPY;  Service: Gastroenterology;  Laterality: N/A;   IR ANGIOGRAM SELECTIVE EACH ADDITIONAL VESSEL   05/09/2022   IR ANGIOGRAM VISCERAL SELECTIVE   05/09/2022   IR CATHETER TUBE CHANGE   05/14/2022   IR CATHETER TUBE CHANGE   06/27/2022   IR EMBO ART  VEN HEMORR LYMPH EXTRAV  INC GUIDE ROADMAPPING   05/09/2022   IR IMAGE GUIDED DRAINAGE PERCUT CATH  PERITONEAL RETROPERIT   05/09/2022   IR PATIENT EVAL TECH 0-60 MINS   06/11/2022   IR RADIOLOGIST EVAL & MGMT   06/26/2022   IR RADIOLOGIST EVAL & MGMT   07/16/2022   IR US GUIDE VASC ACCESS RIGHT   05/09/2022   LEFT HEART CATH AND CORONARY ANGIOGRAPHY N/A 04/17/2022    Procedure: LEFT HEART CATH AND CORONARY ANGIOGRAPHY;  Surgeon: Belva Crome, MD;  Location: [place] [facility];  Service: Cardiovascular;  Laterality: N/A;   POLYPECTOMY   04/16/2022    Procedure: POLYPECTOMY;  Surgeon: Yetta Flock, MD;  Location: [facility] ENDOSCOPY;  Service: Gastroenterology;;   RIGHT/LEFT HEART CATH AND CORONARY ANGIOGRAPHY N/A 09/24/2020    Procedure: RIGHT/LEFT HEART CATH AND CORONARY ANGIOGRAPHY;  Surgeon: Lorretta Harp, MD;  Location: [place] [facility];  Service: Cardiovascular;  Laterality: N/A;   SUBMUCOSAL TATTOO INJECTION   04/16/2022    Procedure: SUBMUCOSAL TATTOO INJECTION;  Surgeon: Yetta Flock, MD;  Location: [facility];  Service: Gastroenterology;;   WRIST TENODESIS               Family History  Problem Relation Age of Onset   Heart disease Mother     Brain cancer Father      Hypertension Daughter        Social History:  Lives with son. He reports that he quit smoking about 9 years ago. His smoking use included cigarettes. He has a 30.00 pack-year smoking history. He has never used smokeless tobacco. He reports that he does not currently use alcohol. He reports that he does not use drugs.     Allergies: No Known Allergies           Medications Prior to Admission  Medication Sig Dispense Refill   acetaminophen (TYLENOL) 325 MG tablet Take 1-2 tablets (325-650 mg total) by mouth every 4 (four) hours as needed for mild pain.       ALPRAZolam (XANAX) 0.25 MG tablet Take 1 tablet (0.25 mg total) by mouth at bedtime as needed for anxiety or sleep. 30 tablet 0   amiodarone (PACERONE) 200 MG tablet Take 200 mg by mouth daily.       atorvastatin (LIPITOR)  80 MG tablet Take 1 tablet (80 mg total) by mouth every evening. 90 tablet 3   capecitabine (XELODA) 500 MG tablet Take 4 tablets every 12 hours for 14 days on and 7 days off, take after a meal. 112 tablet 0   clopidogrel (PLAVIX) 75 MG tablet Take 1 tablet (75 mg total) by mouth daily. 90 tablet 3   dapagliflozin propanediol (FARXIGA) 10 MG TABS tablet Take 1 tablet (10 mg total) by mouth daily. 90 tablet 3   diclofenac Sodium (VOLTAREN) 1 % GEL Apply 2 g topically 4 (four) times daily. (Patient taking differently: Apply 2 g topically daily as needed (For pain).)       ELIQUIS 5 MG TABS tablet TAKE 1 TABLET(5 MG) BY MOUTH TWICE DAILY (Patient taking differently: Take 5 mg by mouth 2 (two) times daily.) 180 tablet 1   feeding supplement (ENSURE ENLIVE / ENSURE PLUS) LIQD Take 237 mLs by mouth 2 (two) times daily between meals. 237 mL 12   furosemide (LASIX) 40 MG tablet Take 1 tablet (40 mg total) by mouth daily. 30 tablet 0   levETIRAcetam (KEPPRA) 500 MG tablet Take 1 tablet twice a day 180 tablet 3   methocarbamol (ROBAXIN) 500 MG tablet Take 1 tablet (500 mg total) by mouth every 6 (six) hours as needed for muscle  spasms. 30 tablet 0   metoprolol succinate (TOPROL-XL) 50 MG 24 hr tablet Take 1 tablet (50 mg total) by mouth 2 (two) times daily. Take with or immediately following a meal. 180 tablet 3   nystatin (MYCOSTATIN/NYSTOP) powder Apply topically 2 (two) times daily. Apply To groin area 15 g 0   oxyCODONE-acetaminophen (PERCOCET/ROXICET) 5-325 MG tablet Take 1 tablet by mouth every 6 (six) hours as needed for moderate pain or severe pain. 21 tablet 0   pantoprazole (PROTONIX) 40 MG tablet Take 40 mg by mouth daily.       polyethylene glycol (MIRALAX / GLYCOLAX) 17 g packet Take 17 g by mouth 2 (two) times daily. (Patient taking differently: Take 17 g by mouth daily as needed for mild constipation.) 14 each 0   potassium chloride (KLOR-CON M) 10 MEQ tablet Take 1 tablet (10 mEq total) by mouth daily. 30 tablet 6   sacubitril-valsartan (ENTRESTO) 97-103 MG Take 1 tablet by mouth 2 (two) times daily. 180 tablet 3   sildenafil (VIAGRA) 50 MG tablet Take 1 tablet (50 mg total) by mouth as needed for erectile dysfunction. (Patient taking differently: Take 50 mg by mouth daily as needed for erectile dysfunction.) 30 tablet 4   traZODone (DESYREL) 100 MG tablet Take 1 tablet (100 mg total) by mouth at bedtime. 90 tablet 3   docusate sodium (COLACE) 100 MG capsule Take 1 capsule (100 mg total) by mouth 2 (two) times daily. 10 capsule 0   ezetimibe (ZETIA) 10 MG tablet Take 1 tablet (10 mg total) by mouth daily. 30 tablet 0   mexiletine (MEXITIL) 150 MG capsule Take 1 capsule (150 mg total) by mouth every 8 (eight) hours. (Patient not taking: Reported on 09/01/2022) 30 capsule 0          Home: Home Living Family/patient expects to be discharged to:: Private residence Living Arrangements:  (son and patient live together) Available Help at Discharge: Family, Available 24 hours/day Type of Home: House Home Access: Level entry Home Layout: Two level, Bed/bath upstairs Alternate Level Stairs-Number of Steps:  14 Alternate Level Stairs-Rails: Right, Left, Can reach both Bathroom Shower/Tub: Multimedia programmer:  Standard Bathroom Accessibility: Yes Home Equipment: Shower seat, Hand held shower head, Rolling Walker (2 wheels) Additional Comments: home setup per prior admission and pt repoort- poor historian  Lives With: Son   Functional History: Prior Function Prior Level of Function : Independent/Modified Independent, Driving, Patient poor historian/Family not available Mobility Comments: pt independent and going to the gym, but then reports using RW prior to admission ADLs Comments: pt reports independent and driving   Functional Status:  Mobility: Bed Mobility Overal bed mobility: Needs Assistance Bed Mobility: Supine to Sit Rolling: Mod assist Sidelying to sit: Max assist, +2 for physical assistance, HOB elevated Supine to sit: Min assist Sit to supine: Min guard, HOB elevated General bed mobility comments: min guard for pt to swing LE around in bed with HoB elevated Transfers Overall transfer level: Needs assistance Equipment used: Rolling walker (2 wheels), 1 person hand held assist Transfers: Sit to/from Stand, Bed to chair/wheelchair/BSC Sit to Stand: From elevated surface, +2 physical assistance, Mod assist Bed to/from chair/wheelchair/BSC transfer type:: Squat pivot Stand pivot transfers: Mod assist, +2 physical assistance General transfer comment: modA for power up from low recliner seat to RW, min A for face to face HHA power up but modA required for steadying while RN checked for evidence of pressure injury Ambulation/Gait Ambulation/Gait assistance: Min assist, Mod assist Gait Distance (Feet): 60 Feet (+10) Assistive device: Rolling walker (2 wheels) Gait Pattern/deviations: Step-through pattern, Decreased weight shift to left General Gait Details: min A for steadying with ambulation, vc for upright posture, reduced grip on RW and relaxation through shoulders,  modA for backward stepping with RW Gait velocity: decreased Gait velocity interpretation: <1.31 ft/sec, indicative of household ambulator Pre-gait activities: marching in place   ADL: ADL Overall ADL's : Needs assistance/impaired Eating/Feeding: NPO Grooming: Minimal assistance, Bed level, Wash/dry face Upper Body Dressing : Minimal assistance, Sitting Lower Body Dressing: Maximal assistance, +2 for safety/equipment, +2 for physical assistance, Sit to/from stand Lower Body Dressing Details (indicate cue type and reason): assist to don socks, min assist + 2 in standing but relies on BUE support Toilet Transfer: Maximal assistance, +2 for physical assistance Toilet Transfer Details (indicate cue type and reason): simulated via functional mobility, heavy L lean Functional mobility during ADLs: Moderate assistance, Maximal assistance, +2 for physical assistance, +2 for safety/equipment, Rolling walker (2 wheels) General ADL Comments: ADL participation limited by impaired cog, increased pain, impaired balacne and decreased activity tolerance   Cognition: Cognition Overall Cognitive Status: Impaired/Different from baseline Arousal/Alertness: Awake/alert Orientation Level: Oriented X4 Attention: Sustained Sustained Attention: Impaired Memory: Impaired Memory Impairment: Decreased short term memory Decreased Short Term Memory: Verbal basic Awareness: Appears intact Problem Solving: Impaired Problem Solving Impairment: Verbal complex Safety/Judgment: Appears intact Cognition Arousal/Alertness: Awake/alert Behavior During Therapy: Flat affect Overall Cognitive Status: Impaired/Different from baseline Area of Impairment: Following commands, Safety/judgement, Orientation, Attention, Memory, Problem solving, Awareness Orientation Level: Disoriented to, Time Current Attention Level: Sustained Memory: Decreased short-term memory Following Commands: Follows multi-step commands with increased  time Safety/Judgement: Decreased awareness of safety, Decreased awareness of deficits Awareness: Emergent Problem Solving: Slow processing, Difficulty sequencing, Requires verbal cues, Requires tactile cues General Comments: cognition continues to improve     Blood pressure 128/79, pulse 64, temperature 97.6 F (36.4 C), temperature source Oral, resp. rate 17, weight 96.7 kg, SpO2 94 %. Physical Exam Vitals and nursing note reviewed.  Constitutional:      General: He is not in acute distress.    Appearance: Normal appearance. He is normal weight.  Comments: Awake, alert, sitting up in bed; appropriate, c/o rib pain- NAD  HENT:     Head: Normocephalic and atraumatic.     Right Ear: External ear normal.     Left Ear: External ear normal.     Nose: Nose normal. No congestion.     Mouth/Throat:     Mouth: Mucous membranes are dry.     Pharynx: Oropharynx is clear. No oropharyngeal exudate.  Eyes:     General:        Right eye: No discharge.        Left eye: No discharge.     Extraocular Movements: Extraocular movements intact.     Comments: Fatty deposits outer cantus bilaterally- chronic  Cardiovascular:     Rate and Rhythm: Normal rate. Rhythm irregular.     Heart sounds: Normal heart sounds. No murmur heard.    No gallop.     Comments: TTP over ribs B/L  Pulmonary:     Effort: Pulmonary effort is normal. No respiratory distress.     Breath sounds: Normal breath sounds. No wheezing, rhonchi or rales.  Abdominal:     General: Bowel sounds are normal. There is no distension.     Palpations: Abdomen is soft.     Tenderness: There is abdominal tenderness (RLQ with referred pain to ribs?).  Musculoskeletal:        General: Normal range of motion.     Cervical back: Neck supple. No tenderness.     Comments: 5-/5 throughout  Skin:    General: Skin is warm and dry.     Comments: IV R forearm- looks OK Blanchable spot on R inner buttocks  Neurological:     General: No focal  deficit present.     Mental Status: He is alert and oriented to person, place, and time.  Psychiatric:        Mood and Affect: Mood normal.        Behavior: Behavior normal.        Lab Results Last 48 Hours        Results for orders placed or performed during the hospital encounter of 08/30/22 (from the past 48 hour(s))  Glucose, capillary     Status: Abnormal    Collection Time: 09/09/22 11:25 AM  Result Value Ref Range    Glucose-Capillary 177 (H) 70 - 99 mg/dL      Comment: Glucose reference range applies only to samples taken after fasting for at least 8 hours.  Glucose, capillary     Status: Abnormal    Collection Time: 09/09/22  4:08 PM  Result Value Ref Range    Glucose-Capillary 166 (H) 70 - 99 mg/dL      Comment: Glucose reference range applies only to samples taken after fasting for at least 8 hours.  Glucose, capillary     Status: Abnormal    Collection Time: 09/09/22  8:15 PM  Result Value Ref Range    Glucose-Capillary 168 (H) 70 - 99 mg/dL      Comment: Glucose reference range applies only to samples taken after fasting for at least 8 hours.  Comprehensive metabolic panel     Status: Abnormal    Collection Time: 09/10/22  6:16 AM  Result Value Ref Range    Sodium 136 135 - 145 mmol/L    Potassium 4.1 3.5 - 5.1 mmol/L    Chloride 106 98 - 111 mmol/L    CO2 25 22 - 32 mmol/L    Glucose, Bld  157 (H) 70 - 99 mg/dL      Comment: Glucose reference range applies only to samples taken after fasting for at least 8 hours.    BUN 11 8 - 23 mg/dL    Creatinine, Ser 0.83 0.61 - 1.24 mg/dL    Calcium 8.2 (L) 8.9 - 10.3 mg/dL    Total Protein 5.9 (L) 6.5 - 8.1 g/dL    Albumin 2.4 (L) 3.5 - 5.0 g/dL    AST 49 (H) 15 - 41 U/L    ALT 78 (H) 0 - 44 U/L    Alkaline Phosphatase 147 (H) 38 - 126 U/L    Total Bilirubin 0.5 0.3 - 1.2 mg/dL    GFR, Estimated >60 >60 mL/min      Comment: (NOTE) Calculated using the CKD-EPI Creatinine Equation (2021)      Anion gap 5 5 - 15       Comment: Performed at [facility], [place] [address] [place], [place] [address]  CBC     Status: Abnormal    Collection Time: 09/10/22  6:16 AM  Result Value Ref Range    WBC 13.6 (H) 4.0 - 10.5 K/uL    RBC 3.67 (L) 4.22 - 5.81 MIL/uL    Hemoglobin 12.0 (L) 13.0 - 17.0 g/dL    HCT 35.7 (L) 39.0 - 52.0 %    MCV 97.3 80.0 - 100.0 fL    MCH 32.7 26.0 - 34.0 pg    MCHC 33.6 30.0 - 36.0 g/dL    RDW 23.5 (H) 11.5 - 15.5 %    Platelets 244 150 - 400 K/uL    nRBC 0.0 0.0 - 0.2 %      Comment: Performed at [facility] [address], [place], [place] [address]  Glucose, capillary     Status: Abnormal    Collection Time: 09/10/22  7:49 AM  Result Value Ref Range    Glucose-Capillary 185 (H) 70 - 99 mg/dL      Comment: Glucose reference range applies only to samples taken after fasting for at least 8 hours.  Glucose, capillary     Status: Abnormal    Collection Time: 09/10/22 11:45 AM  Result Value Ref Range    Glucose-Capillary 167 (H) 70 - 99 mg/dL      Comment: Glucose reference range applies only to samples taken after fasting for at least 8 hours.  Glucose, capillary     Status: Abnormal    Collection Time: 09/10/22  4:45 PM  Result Value Ref Range    Glucose-Capillary 149 (H) 70 - 99 mg/dL      Comment: Glucose reference range applies only to samples taken after fasting for at least 8 hours.    Comment 1 Notify RN    Glucose, capillary     Status: Abnormal    Collection Time: 09/10/22  9:43 PM  Result Value Ref Range    Glucose-Capillary 112 (H) 70 - 99 mg/dL      Comment: Glucose reference range applies only to samples taken after fasting for at least 8 hours.    Comment 1 Notify RN    Glucose, capillary     Status: Abnormal    Collection Time: 09/11/22  7:29 AM  Result Value Ref Range    Glucose-Capillary 119 (H) 70 - 99 mg/dL      Comment: Glucose reference range applies only to samples taken after fasting for at least 8 hours.      Imaging  Results (Last 48  hours)  No results found.         Blood pressure 128/79, pulse 64, temperature 97.6 F (36.4 C), temperature source Oral, resp. rate 17, weight 96.7 kg, SpO2 94 %.   Medical Problem List and Plan: 1. Functional deficits secondary to PEA arrest/debility             -patient may  shower             -ELOS/Goals: 10-14 days- supervision to min A 2.  Antithrombotics: -DVT/anticoagulation:  Pharmaceutical: Other (comment) Eliquis.              -antiplatelet therapy: Plavix.  3. Pain Management: Oxycodone prn.  4. Mood/Behavior/Sleep: LCSW to follow for evaluation and support.              --Ambien helps him stay asleep longer (better than trazodone)             -antipsychotic agents: N/A 5. Neuropsych/cognition: This patient is capable of making decisions on his own behalf. 6. Skin/Wound Care: Routine pressure relief measures.  7. Fluids/Electrolytes/Nutrition: Monitor I/O. Check CMET in am.  8. H/o new seizures: Continue Keppr 1500 mg BID 9. CAD/ICM/chronic combined CHF: Monitor for signs of overload.  --Continue Lipitor, Farxiga, Lasix, Entresto, atorvastatin and Plavix.   10. V tach s/p AICD: On amiodarone and Metoprolol. Me 11. A fib: Monitor HR TID. On eliquis, amiodarone and metoprolol 12.  Rib fractures: Encourage pulmonary hygeine.  13. Colon Ca w/mets: Xeloda to resume after discharge.      I have personally performed a face to face diagnostic evaluation of this patient and formulated the key components of the plan.  Additionally, I have personally reviewed laboratory data, imaging studies, as well as relevant notes and concur with the physician assistant's documentation above.   The patient's status has not changed from the original H&P.  Any changes in documentation from the acute care chart have been noted above.       Bary Leriche, PA-C 09/11/2022

## 2022-09-11 NOTE — Progress Notes (Signed)
Gregory Heys, MD  Physician Other PMR Pre-admission    Signed Date of Service:  09/08/2022  4:17 PM  Related encounter: ED to Hosp-Admission (Discharged) from 08/30/2022 in [place]   Signed      PMR Admission Coordinator Pre-Admission Assessment   Patient: Gregory [name] is an 61 y.o., male MRN: [id number] DOB: [birth year] Height:   Weight: 96.7 kg   Insurance Information HMO:     PPO:      PCP:      IPA:      80/20:      OTHER:  PRIMARY: [employer]/Like a workers comp case      Policy#: [id number]      Subscriber: pt CM Name: Gregory [name]      Phone#: [phone]     Fax#: [phone] Pre-Cert#: claim # [id number] approved for 7 days only. Would have to appeal for additional days     Employer: [employer] [place], [place]; date of injury was 05/29/2004; retired [place], anything heart related goes through this claim Benefits:  Phone #: [employer], [place]     Name: 9/25 Eff. Date: 05/29/2004     Deduct:       Out of Pocket Max:       Life Max:  CIR: per workers comp guidelines      SNF:  Outpatient:      Co-Pay:  Home Health:       Co-Pay:  DME:      Co-Pay:  Providers:   SECONDARY: [employer]      Policy#: [id number]   Financial Counselor:       Phone#:    The "Data Collection Information Summary" for patients in Inpatient Rehabilitation Facilities with attached "Privacy Act [place] Records" was provided and verbally reviewed with: N/A   Emergency Contact Information Contact Information       Name Relation Home Work [place] Daughter [phone]   [phone]    Gregory [name]     [phone]    [facility] Mother [phone]        Gregory [name] Brother [phone]        Gregory [name], College Daughter     [phone]    Gregory [name]     [phone]         Current Medical History  Patient Admitting Diagnosis: Cardiac arrest   History of Present Illness: 61 year old  male with history of metastatic colon cancer, Heart failure, VT s/p ICD, afib, HTN, DM, CAD , and seizure disorder. Presented on 9/16 after seizure and brief PEA arrest. Felt likely occurred following an aspiration event following a seizure. Cardiology consulted. Recommended continue home meds. AICD interrogated.  Neurology consulted and recommends to continue [place]. For his aspiration pneumonia he has completed his course of antibiotics.    Noted severe protein calorie malnutrition. Initially had feeding tube and dietician consulted. Will continue po an supplemental feedings.    Complete NIHSS TOTAL: 0   Patient's medical [facility] from [facility] has been reviewed by the rehabilitation admission coordinator and physician.   Past Medical History      Past Medical History:  Diagnosis Date   AICD (automatic cardioverter/defibrillator) present 2005   CAD (coronary artery disease) 12/01/2013   Chronic combined systolic and diastolic CHF, NYHA class 1 ([place]) 12/01/2013   Erectile dysfunction 12/01/2013   HTN (hypertension) 12/01/2013   Hyperlipidemia 12/01/2013   Ischemic cardiomyopathy 12/01/2013   Presence of permanent cardiac  pacemaker     Sleep apnea      Has the patient had major surgery during 100 days prior to admission? No   Family History   family history includes Brain cancer in his father; Heart disease in his mother; Hypertension in his daughter.   Current Medications   Current Facility-Administered Medications:    acetaminophen (TYLENOL) tablet 650 mg, 650 mg, Oral, Q4H PRN, Agarwala, Ravi, MD, 650 mg at 09/11/22 0020   amiodarone (PACERONE) tablet 400 mg, 400 mg, Oral, Daily, Agarwala, Ravi, MD, 400 mg at 09/11/22 3016   apixaban (ELIQUIS) tablet 5 mg, 5 mg, Oral, BID, Agarwala, Ravi, MD, 5 mg at 09/11/22 0923   atorvastatin (LIPITOR) tablet 80 mg, 80 mg, Oral, QPM, Agarwala, Ravi, MD, 80 mg at 09/10/22 1738   clopidogrel (PLAVIX) tablet 75 mg, 75 mg, Oral, Daily,  Agarwala, Ravi, MD, 75 mg at 09/11/22 0920   dapagliflozin propanediol (FARXIGA) tablet 10 mg, 10 mg, Oral, Daily, Agarwala, Ravi, MD, 10 mg at 09/11/22 0109   diclofenac Sodium (VOLTAREN) 1 % topical gel 2 g, 2 g, Topical, QID, Agarwala, Ravi, MD, 2 g at 09/10/22 1221   docusate sodium (COLACE) capsule 100 mg, 100 mg, Oral, BID PRN, Kipp Brood, MD   ezetimibe (ZETIA) tablet 10 mg, 10 mg, Oral, Daily, Agarwala, Ravi, MD, 10 mg at 09/11/22 3235   feeding supplement (ENSURE ENLIVE / ENSURE PLUS) liquid 237 mL, 237 mL, Oral, TID BM, Agarwala, Ravi, MD, 237 mL at 09/11/22 0927   furosemide (LASIX) tablet 40 mg, 40 mg, Oral, Daily, Agarwala, Ravi, MD, 40 mg at 09/11/22 5732   hydrALAZINE (APRESOLINE) injection 10 mg, 10 mg, Intravenous, Q4H PRN, Donne Hazel, MD   insulin aspart (novoLOG) injection 0-15 Units, 0-15 Units, Subcutaneous, TID WC, Donne Hazel, MD, 2 Units at 09/10/22 1737   insulin aspart (novoLOG) injection 0-5 Units, 0-5 Units, Subcutaneous, QHS, Donne Hazel, MD   levETIRAcetam (KEPPRA) tablet 1,500 mg, 1,500 mg, Oral, BID, Donne Hazel, MD, 1,500 mg at 09/11/22 2025   levothyroxine (SYNTHROID) tablet 50 mcg, 50 mcg, Oral, Q0600, Kipp Brood, MD, 50 mcg at 09/11/22 0555   lidocaine (LMX) 4 % cream, , Topical, TID PRN, Donne Hazel, MD, 1 Application at 42/70/62 0949   LORazepam (ATIVAN) injection 4 mg, 4 mg, Intravenous, Q5 min PRN, Agarwala, Ravi, MD, 4 mg at 08/30/22 2156   metoprolol succinate (TOPROL-XL) 24 hr tablet 50 mg, 50 mg, Oral, BID, Agarwala, Ravi, MD, 50 mg at 09/11/22 0919   multivitamin with minerals tablet 1 tablet, 1 tablet, Oral, Daily, Agarwala, Ravi, MD, 1 tablet at 09/11/22 0922   ondansetron (ZOFRAN) injection 4 mg, 4 mg, Intravenous, Q6H PRN, Agarwala, Ravi, MD   oxyCODONE (Oxy IR/ROXICODONE) immediate release tablet 10 mg, 10 mg, Oral, Q4H PRN, Agarwala, Ravi, MD, 10 mg at 09/11/22 0556   pantoprazole (PROTONIX) EC tablet 40 mg, 40 mg,  Oral, Daily, Agarwala, Ravi, MD, 40 mg at 09/11/22 3762   potassium chloride (KLOR-CON M) CR tablet 10 mEq, 10 mEq, Oral, Daily, Agarwala, Ravi, MD, 10 mEq at 09/11/22 0920   sacubitril-valsartan (ENTRESTO) 97-103 mg per tablet, 1 tablet, Oral, BID, Agarwala, Einar Grad, MD, 1 tablet at 09/11/22 0924   zolpidem (AMBIEN) tablet 5 mg, 5 mg, Oral, QHS PRN, Donne Hazel, MD, 5 mg at 09/10/22 2129   Patients Current Diet:  Diet Order  Diet - low sodium heart healthy             Diet regular Room service appropriate? Yes; Fluid consistency: Thin; Fluid restriction: 1800 mL Fluid  Diet effective now                       Precautions / Restrictions Precautions Precautions: Fall, Other (comment) Precaution Comments: Rt rib pain Restrictions Weight Bearing Restrictions: No    Has the patient had 2 or more falls or a fall with injury in the past year? No   Prior Activity Level Limited Community (1-2x/wk): Mod I with RW, needed some asist with adls   Prior Functional Level Self Care: Did the patient need help bathing, dressing, using the toilet or eating? Needed some help   Indoor Mobility: Did the patient need assistance with walking from room to room (with or without device)? Independent   Stairs: Did the patient need assistance with internal or external stairs (with or without device)? Independent   Functional Cognition: Did the patient need help planning regular tasks such as shopping or remembering to take medications? Independent   Patient Information Are you of Hispanic, Latino/a,or Spanish origin?: A. No, not of Hispanic, Latino/a, or Spanish origin What is your race?: B. Black or African American Do you need or want an interpreter to communicate with a doctor or health care staff?: 0. No   Patient's Response To:  Health Literacy and Transportation Is the patient able to respond to health literacy and transportation needs?: Yes Health Literacy - How often do  you need to have someone help you when you read instructions, pamphlets, or other written material from your doctor or pharmacy?: Never In the past 12 months, has lack of transportation kept you from medical appointments or from getting medications?: No In the past 12 months, has lack of transportation kept you from meetings, work, or from getting things needed for daily living?: No   Development worker, international aid / Equipment Home Equipment: Shower seat, Engineer, manufacturing held shower head, Conservation officer, nature (2 wheels)   Prior Device Use: Indicate devices/aids used by the patient prior to current illness, exacerbation or injury? Walker   Current Functional Level Cognition   Arousal/Alertness: Awake/alert Overall Cognitive Status: Impaired/Different from baseline Current Attention Level: Sustained Orientation Level: Oriented X4 Following Commands: Follows multi-step commands with increased time Safety/Judgement: Decreased awareness of safety, Decreased awareness of deficits General Comments: cognition continues to improve Attention: Sustained Sustained Attention: Impaired Memory: Impaired Memory Impairment: Decreased short term memory Decreased Short Term Memory: Verbal basic Awareness: Appears intact Problem Solving: Impaired Problem Solving Impairment: Verbal complex Safety/Judgment: Appears intact    Extremity Assessment (includes Sensation/Coordination)   Upper Extremity Assessment: RUE deficits/detail, LUE deficits/detail, Difficult to assess due to impaired cognition RUE Deficits / Details: decreased coordination R>L but question cognition limiting LUE Deficits / Details: baseline L shoulder AROM limitations  Lower Extremity Assessment: Defer to PT evaluation RLE Deficits / Details: MMT screen seated EOB >4/5 strength in hip, knees and ankles but pt with poor motor planning and apparent decreased coordination with RLE requiring multimodal cues for correct movement (ex. pt performing ankle pumps with  LLE despite multiple cues to perform with RLE)     ADLs   Overall ADL's : Needs assistance/impaired Eating/Feeding: NPO Grooming: Minimal assistance, Bed level, Wash/dry face Upper Body Dressing : Minimal assistance, Sitting Lower Body Dressing: Maximal assistance, +2 for safety/equipment, +2 for physical assistance, Sit to/from stand Lower Body Dressing Details (  indicate cue type and reason): assist to don socks, min assist + 2 in standing but relies on BUE support Toilet Transfer: Maximal assistance, +2 for physical assistance Toilet Transfer Details (indicate cue type and reason): simulated via functional mobility, heavy L lean Functional mobility during ADLs: Moderate assistance, Maximal assistance, +2 for physical assistance, +2 for safety/equipment, Rolling walker (2 wheels) General ADL Comments: ADL participation limited by impaired cog, increased pain, impaired balacne and decreased activity tolerance     Mobility   Overal bed mobility: Needs Assistance Bed Mobility: Supine to Sit Rolling: Mod assist Sidelying to sit: Max assist, +2 for physical assistance, HOB elevated Supine to sit: Min assist Sit to supine: Min guard, HOB elevated General bed mobility comments: min guard for pt to swing LE around in bed with HoB elevated     Transfers   Overall transfer level: Needs assistance Equipment used: Rolling walker (2 wheels), 1 person hand held assist Transfers: Sit to/from Stand, Bed to chair/wheelchair/BSC Sit to Stand: From elevated surface, +2 physical assistance, Mod assist Bed to/from chair/wheelchair/BSC transfer type:: Squat pivot Stand pivot transfers: Mod assist, +2 physical assistance General transfer comment: modA for power up from low recliner seat to RW, min A for face to face HHA power up but modA required for steadying while RN checked for evidence of pressure injury     Ambulation / Gait / Stairs / Wheelchair Mobility   Ambulation/Gait Ambulation/Gait  assistance: Min assist, Mod assist Gait Distance (Feet): 60 Feet (+10) Assistive device: Rolling walker (2 wheels) Gait Pattern/deviations: Step-through pattern, Decreased weight shift to left General Gait Details: min A for steadying with ambulation, vc for upright posture, reduced grip on RW and relaxation through shoulders, modA for backward stepping with RW Gait velocity: decreased Gait velocity interpretation: <1.31 ft/sec, indicative of household ambulator Pre-gait activities: marching in place     Posture / Balance Dynamic Sitting Balance Sitting balance - Comments: min guard-Min A Balance Overall balance assessment: Needs assistance Sitting-balance support: No upper extremity supported, Feet supported Sitting balance-Leahy Scale: Fair Sitting balance - Comments: min guard-Min A Standing balance support: During functional activity, Reliant on assistive device for balance, Bilateral upper extremity supported Standing balance-Leahy Scale: Poor Standing balance comment: relies on UE support     Special needs/care consideration on 9/16 on acute sacrum mid stage 1 (7 cm length, 4 cm width, and 0 cm depth) DNR on acute AICD    Previous Home Environment  Living Arrangements:  (son and patient live together)  Lives With: Son Available Help at Discharge: Family, Available 24 hours/day Type of Home: House Home Layout: Two level, Bed/bath upstairs Alternate Level Stairs-Rails: Right, Left, Can reach both Alternate Level Stairs-Number of Steps: 14 Home Access: Level entry Bathroom Shower/Tub: Multimedia programmer: Standard Bathroom Accessibility: Yes How Accessible: Accessible via walker Home Care Services: No Additional Comments: home setup per prior admission and pt repoort- poor historian   Discharge Living Setting Plans for Discharge Living Setting: Patient's home, Lives with (comment) (son) Type of Home at Discharge: House Discharge Home Layout: Two level, 1/2 bath  on main level, Bed/bath upstairs Alternate Level Stairs-Rails: Right, Left Alternate Level Stairs-Number of Steps: 14 Discharge Home Access: Level entry Discharge Bathroom Shower/Tub: Tub/shower unit, Walk-in shower Discharge Bathroom Toilet: Standard Discharge Bathroom Accessibility: Yes How Accessible: Accessible via walker Does the patient have any problems obtaining your medications?: No   Social/Family/Support Systems Patient Roles: Parent Contact Information: son, Sayeed IV Anticipated Caregiver: son Anticipated Ambulance person  Information: see contacts Ability/Limitations of Caregiver: son works from home and has taken [employer] as needed Caregiver Availability: 24/7 Discharge Plan Discussed with Primary Caregiver: Yes Is Caregiver In Agreement with Plan?: Yes Does Caregiver/Family have Issues with Lodging/Transportation while Pt is in Rehab?: No   Goals Patient/Family Goal for Rehab: superivision to min with PT and OT, supervision with SLP Expected length of stay: ELOS 10 to 14 days Pt/Family Agrees to Admission and willing to participate: Yes Program Orientation Provided & Reviewed with Pt/Caregiver Including Roles  & Responsibilities: Yes   Decrease burden of Care through IP rehab admission: n/a   Possible need for SNF placement upon discharge: not anticipated   Patient Condition: I have reviewed medical records from [facility], spoken with CM, and patient and son. I met with patient at the bedside for inpatient rehabilitation assessment.  Patient will benefit from ongoing PT, OT, and SLP, can actively participate in 3 hours of therapy a day 5 days of the week, and can make measurable gains during the admission.  Patient will also benefit from the coordinated team approach during an Inpatient Acute Rehabilitation admission.  The patient will receive intensive therapy as well as Rehabilitation physician, nursing, social worker, and care management interventions.  Due  to bladder management, bowel management, safety, skin/wound care, disease management, medication administration, pain management, and patient education the patient requires 24 hour a day rehabilitation nursing.  The patient is currently is min to mod assist overall with mobility and basic ADLs.  Discharge setting and therapy post discharge at home with home health is anticipated.  Patient has agreed to participate in the Acute Inpatient Rehabilitation Program and will admit today.   Preadmission Screen Completed By:  Cleatrice Burke, 09/11/2022 10:23 AM ______________________________________________________________________   Discussed status with Dr. Dagoberto Ligas on 09/11/22 at 66 and received approval for admission today.   Admission Coordinator:  Cleatrice Burke, RN, time 1022 Date 09/11/22    Assessment/Plan: Diagnosis: Does the need for close, 24 hr/day Medical supervision in concert with the patient's rehab needs make it unreasonable for this patient to be served in a less intensive setting? Yes Co-Morbidities requiring supervision/potential complications: stage III/IV colon CA; Afib on AC; seizures, PEA arrest; severe malnutrition Due to bladder management, bowel management, safety, skin/wound care, disease management, medication administration, pain management, and patient education, does the patient require 24 hr/day rehab nursing? Yes Does the patient require coordinated care of a physician, rehab nurse, PT, OT, and SLP to address physical and functional deficits in the context of the above medical diagnosis(es)? Yes Addressing deficits in the following areas: balance, endurance, locomotion, strength, transferring, bowel/bladder control, bathing, dressing, feeding, grooming, toileting, cognition, and speech Can the patient actively participate in an intensive therapy program of at least 3 hrs of therapy 5 days a week? Yes The potential for patient to make measurable gains while on  inpatient rehab is good Anticipated functional outcomes upon discharge from inpatient rehab: supervision and min assist PT, supervision and min assist OT, supervision SLP Estimated rehab length of stay to reach the above functional goals is: 10-14 days Anticipated discharge destination: Home 10. Overall Rehab/Functional Prognosis: good     MD Signature:           Revision History                                Note Details  Author Gregory Heys, MD File  Time 09/11/2022 10:37 AM  Author Type Physician Status Signed  Last Editor Gregory Heys, MD Service [facility] Acct # [id number] [place] Date 09/11/2022

## 2022-09-11 NOTE — TOC Transition Note (Signed)
Transition of Care [facility]) - CM/SW Discharge Note   Patient Details  Name: Gregory [name] MRN: [id number] Date of Birth: [birth year]  [facility] of [facility]) CM/SW Contact:  Tom-Johnson, Renea Ee, RN Phone Number: 09/11/2022, 10:10 AM   Clinical Narrative:     Patient is scheduled for discharge to CIR today. Transported via bed as in-hospital transfer. No further TOC needs noted.   Final next level of care: IP Rehab Facility Barriers to Discharge: Barriers Resolved   Patient Goals and CMS Choice Patient states their goals for this hospitalization and ongoing recovery are:: To go to rehab then return home. CMS Medicare.gov Compare Post Acute Care list provided to:: Patient Choice offered to / list presented to : Patient  Discharge Placement                Patient to be transferred to facility by: Bed      Discharge Plan and Services In-house Referral: NA Discharge Planning Services: CM Consult Post Acute Care Choice: IP Rehab          DME Arranged: N/A DME Agency: NA       HH Arranged: NA          Social Determinants of Health (SDOH) Interventions     Readmission Risk Interventions    09/04/2022   11:14 AM 04/22/2022   12:49 PM  Readmission Risk Prevention Plan  Transportation Screening Complete Complete  PCP or Specialist Appt within 3-5 Days  Complete  HRI or [place]  Complete  Social Work Consult for [place] Planning/Counseling  Complete  Palliative Care Screening  Not Applicable  Medication Review Press photographer) Complete Complete  PCP or Specialist appointment within 3-5 days of discharge Complete   HRI or [place] Complete   SW Recovery Care/Counseling Consult Complete   [place] Not Applicable

## 2022-09-11 NOTE — H&P (Signed)
Physical Medicine and Rehabilitation Admission H&P    Chief Complaint  Patient presents with   Functional deficits due to PEA arrest    HPI: Gregory [name] is a 61 year old male with history of HTN, VT/ICD s/p AICD, PAF: Seizure disorder, 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  He became hypoxic requiring intubation and then became pulseless requiring 1 round of CPR with epi as well as high doses of epi.  CT head question hyperdense left MCA branch but CTA was negative for LVO.  EEG showed severe diffuse encephalopathy without seizures or epileptiform discharges.  Dr. Cheral Marker question anoxic brain injury and possibly brain mets and MRI for work-up.  CTA chest negative for PE and aspiration pneumonia treated with unasyn.  He was extubated without difficulty and cardiology consulted for input on right-sided chest pain which was felt to be costochondral consistent with post CPR injury. Mexiletine as discontinued, he was loaded with amiodarone and tapered to 400 mg dialy There was no need for coronary catheterization and ICD interrogated to make sure the seizure was not due to arrhythmia.  ICD download did not show episodes of sustained or non-sustained V. Tach.  PT/OT has been working with patient who continues to be limited by chest wall pain, weakness and debility. CIR recommended due to functional decline.    Ribs killing him- from CPR. Using oxycodone and lidocaine patches Pt feels cognition back to baseline- admits poor memory "at baseline".  LBM yesterday  Review of Systems  Constitutional:  Negative for chills and fever.  Eyes:  Negative for blurred vision and double vision.  Respiratory:  Negative for cough and shortness of breath.   Cardiovascular:  Positive for chest pain (lower  right rib pain-- a little pain). Negative for palpitations.  Gastrointestinal:  Negative for abdominal pain, heartburn and nausea.  Genitourinary:  Negative for frequency and urgency.  Musculoskeletal:  Negative for myalgias.  Skin:  Negative for rash.  Neurological:  Positive for weakness. Negative for dizziness and headaches.  Psychiatric/Behavioral:  The patient has insomnia (chronic).   All other systems reviewed and are negative.   Past Medical History:  Diagnosis Date   AICD (automatic cardioverter/defibrillator) present 2005   CAD (coronary artery disease) 12/01/2013   Chronic combined systolic and diastolic CHF, NYHA class 1 ([place]) 12/01/2013   Erectile dysfunction 12/01/2013   HTN (hypertension) 12/01/2013   Hyperlipidemia 12/01/2013   Ischemic cardiomyopathy 12/01/2013   Presence of permanent cardiac pacemaker    Sleep apnea     Past Surgical History:  Procedure Laterality Date   ACHILLES TENDON REPAIR     lft foot   BIOPSY  04/16/2022   Procedure: BIOPSY;  Surgeon: Yetta Flock, MD;  Location: [place];  Service: Gastroenterology;;   CARDIAC CATHETERIZATION N/A 05/05/2016   Procedure: Left Heart Cath and Coronary Angiography;  Surgeon: Leonie Man, MD;  Location: [place] [facility];  Service: Cardiovascular;  Laterality: N/A;   CARDIAC DEFIBRILLATOR PLACEMENT     CARDIOVERSION N/A 09/13/2020   Procedure: CARDIOVERSION;  Surgeon: Werner Lean, MD;  Location: [place] [facility];  Service: Cardiovascular;  Laterality: N/A;   CARDIOVERSION N/A 09/19/2020   Procedure: CARDIOVERSION;  Surgeon: [name], MD;  Location: [place];  Service: Cardiovascular;  Laterality: N/A;   COLON  RESECTION N/A 04/18/2022   Procedure: HAND ASSISTED LAPAROSCOPIC RIGHT COLON RESECTION;  Surgeon: Felicie Morn, MD;  Location: [place];  Service: General;  Laterality: N/A;   COLONOSCOPY WITH PROPOFOL N/A 04/16/2022   Procedure: COLONOSCOPY WITH PROPOFOL;  Surgeon:  Yetta Flock, MD;  Location: [place];  Service: Gastroenterology;  Laterality: N/A;   EP IMPLANTABLE DEVICE N/A 12/19/2016   Procedure: ICD Generator Changeout;  Surgeon: [name], MD;  Location: [place] [facility];  Service: Cardiovascular;  Laterality: N/A;   ESOPHAGOGASTRODUODENOSCOPY N/A 04/16/2022   Procedure: ESOPHAGOGASTRODUODENOSCOPY (EGD);  Surgeon: Yetta Flock, MD;  Location: [facility];  Service: Gastroenterology;  Laterality: N/A;   IR ANGIOGRAM SELECTIVE EACH ADDITIONAL VESSEL  05/09/2022   IR ANGIOGRAM VISCERAL SELECTIVE  05/09/2022   IR CATHETER TUBE CHANGE  05/14/2022   IR CATHETER TUBE CHANGE  06/27/2022   IR EMBO ART  VEN HEMORR LYMPH EXTRAV  INC GUIDE ROADMAPPING  05/09/2022   IR IMAGE GUIDED DRAINAGE PERCUT CATH  PERITONEAL RETROPERIT  05/09/2022   IR PATIENT EVAL TECH 0-60 MINS  06/11/2022   IR RADIOLOGIST EVAL & MGMT  06/26/2022   IR RADIOLOGIST EVAL & MGMT  07/16/2022   IR US GUIDE VASC ACCESS RIGHT  05/09/2022   LEFT HEART CATH AND CORONARY ANGIOGRAPHY N/A 04/17/2022   Procedure: LEFT HEART CATH AND CORONARY ANGIOGRAPHY;  Surgeon: Belva Crome, MD;  Location: [place] [facility];  Service: Cardiovascular;  Laterality: N/A;   POLYPECTOMY  04/16/2022   Procedure: POLYPECTOMY;  Surgeon: Yetta Flock, MD;  Location: [facility] ENDOSCOPY;  Service: Gastroenterology;;   RIGHT/LEFT HEART CATH AND CORONARY ANGIOGRAPHY N/A 09/24/2020   Procedure: RIGHT/LEFT HEART CATH AND CORONARY ANGIOGRAPHY;  Surgeon: Lorretta Harp, MD;  Location: [place] [facility];  Service: Cardiovascular;  Laterality: N/A;   SUBMUCOSAL TATTOO INJECTION  04/16/2022   Procedure: SUBMUCOSAL TATTOO INJECTION;  Surgeon: Yetta Flock, MD;  Location: [facility];  Service: Gastroenterology;;   WRIST TENODESIS      Family History  Problem Relation Age of Onset   Heart disease Mother    Brain cancer Father    Hypertension Daughter     Social History:  Lives with son. He reports that  he quit smoking about 9 years ago. His smoking use included cigarettes. He has a 30.00 pack-year smoking history. He has never used smokeless tobacco. He reports that he does not currently use alcohol. He reports that he does not use drugs.   Allergies: No Known Allergies   Medications Prior to Admission  Medication Sig Dispense Refill   acetaminophen (TYLENOL) 325 MG tablet Take 1-2 tablets (325-650 mg total) by mouth every 4 (four) hours as needed for mild pain.     ALPRAZolam (XANAX) 0.25 MG tablet Take 1 tablet (0.25 mg total) by mouth at bedtime as needed for anxiety or sleep. 30 tablet 0   amiodarone (PACERONE) 200 MG tablet Take 200 mg by mouth daily.     atorvastatin (LIPITOR) 80 MG tablet Take 1 tablet (80 mg total) by mouth every evening. 90 tablet 3   capecitabine (XELODA) 500 MG tablet Take 4 tablets every 12 hours for 14 days on and 7 days off, take after a meal. 112 tablet 0   clopidogrel (PLAVIX) 75 MG tablet Take 1 tablet (75 mg total) by mouth daily. 90 tablet 3   dapagliflozin propanediol (FARXIGA) 10 MG TABS tablet Take 1 tablet (10 mg total) by mouth daily. 90 tablet 3  diclofenac Sodium (VOLTAREN) 1 % GEL Apply 2 g topically 4 (four) times daily. (Patient taking differently: Apply 2 g topically daily as needed (For pain).)     ELIQUIS 5 MG TABS tablet TAKE 1 TABLET(5 MG) BY MOUTH TWICE DAILY (Patient taking differently: Take 5 mg by mouth 2 (two) times daily.) 180 tablet 1   feeding supplement (ENSURE ENLIVE / ENSURE PLUS) LIQD Take 237 mLs by mouth 2 (two) times daily between meals. 237 mL 12   furosemide (LASIX) 40 MG tablet Take 1 tablet (40 mg total) by mouth daily. 30 tablet 0   levETIRAcetam (KEPPRA) 500 MG tablet Take 1 tablet twice a day 180 tablet 3   methocarbamol (ROBAXIN) 500 MG tablet Take 1 tablet (500 mg total) by mouth every 6 (six) hours as needed for muscle spasms. 30 tablet 0   metoprolol succinate (TOPROL-XL) 50 MG 24 hr tablet Take 1 tablet (50 mg  total) by mouth 2 (two) times daily. Take with or immediately following a meal. 180 tablet 3   nystatin (MYCOSTATIN/NYSTOP) powder Apply topically 2 (two) times daily. Apply To groin area 15 g 0   oxyCODONE-acetaminophen (PERCOCET/ROXICET) 5-325 MG tablet Take 1 tablet by mouth every 6 (six) hours as needed for moderate pain or severe pain. 21 tablet 0   pantoprazole (PROTONIX) 40 MG tablet Take 40 mg by mouth daily.     polyethylene glycol (MIRALAX / GLYCOLAX) 17 g packet Take 17 g by mouth 2 (two) times daily. (Patient taking differently: Take 17 g by mouth daily as needed for mild constipation.) 14 each 0   potassium chloride (KLOR-CON M) 10 MEQ tablet Take 1 tablet (10 mEq total) by mouth daily. 30 tablet 6   sacubitril-valsartan (ENTRESTO) 97-103 MG Take 1 tablet by mouth 2 (two) times daily. 180 tablet 3   sildenafil (VIAGRA) 50 MG tablet Take 1 tablet (50 mg total) by mouth as needed for erectile dysfunction. (Patient taking differently: Take 50 mg by mouth daily as needed for erectile dysfunction.) 30 tablet 4   traZODone (DESYREL) 100 MG tablet Take 1 tablet (100 mg total) by mouth at bedtime. 90 tablet 3   docusate sodium (COLACE) 100 MG capsule Take 1 capsule (100 mg total) by mouth 2 (two) times daily. 10 capsule 0   ezetimibe (ZETIA) 10 MG tablet Take 1 tablet (10 mg total) by mouth daily. 30 tablet 0   mexiletine (MEXITIL) 150 MG capsule Take 1 capsule (150 mg total) by mouth every 8 (eight) hours. (Patient not taking: Reported on 09/01/2022) 30 capsule 0      Home: Home Living Family/patient expects to be discharged to:: Private residence Living Arrangements:  (son and patient live together) Available Help at Discharge: Family, Available 24 hours/day Type of Home: House Home Access: Level entry Home Layout: Two level, Bed/bath upstairs Alternate Level Stairs-Number of Steps: 14 Alternate Level Stairs-Rails: Right, Left, Can reach both Bathroom Shower/Tub: Clinical cytogeneticist: Standard Bathroom Accessibility: Yes Home Equipment: Shower seat, Hand held shower head, Conservation officer, nature (2 wheels) Additional Comments: home setup per prior admission and pt repoort- poor historian  Lives With: Son   Functional History: Prior Function Prior Level of Function : Independent/Modified Independent, Driving, Patient poor historian/Family not available Mobility Comments: pt independent and going to the gym, but then reports using RW prior to admission ADLs Comments: pt reports independent and driving  Functional Status:  Mobility: Bed Mobility Overal bed mobility: Needs Assistance Bed Mobility: Supine to Sit Rolling: Mod  assist Sidelying to sit: Max assist, +2 for physical assistance, HOB elevated Supine to sit: Min assist Sit to supine: Min guard, HOB elevated General bed mobility comments: min guard for pt to swing LE around in bed with HoB elevated Transfers Overall transfer level: Needs assistance Equipment used: Rolling walker (2 wheels), 1 person hand held assist Transfers: Sit to/from Stand, Bed to chair/wheelchair/BSC Sit to Stand: From elevated surface, +2 physical assistance, Mod assist Bed to/from chair/wheelchair/BSC transfer type:: Squat pivot Stand pivot transfers: Mod assist, +2 physical assistance General transfer comment: modA for power up from low recliner seat to RW, min A for face to face HHA power up but modA required for steadying while RN checked for evidence of pressure injury Ambulation/Gait Ambulation/Gait assistance: Min assist, Mod assist Gait Distance (Feet): 60 Feet (+10) Assistive device: Rolling walker (2 wheels) Gait Pattern/deviations: Step-through pattern, Decreased weight shift to left General Gait Details: min A for steadying with ambulation, vc for upright posture, reduced grip on RW and relaxation through shoulders, modA for backward stepping with RW Gait velocity: decreased Gait velocity interpretation:  <1.31 ft/sec, indicative of household ambulator Pre-gait activities: marching in place    ADL: ADL Overall ADL's : Needs assistance/impaired Eating/Feeding: NPO Grooming: Minimal assistance, Bed level, Wash/dry face Upper Body Dressing : Minimal assistance, Sitting Lower Body Dressing: Maximal assistance, +2 for safety/equipment, +2 for physical assistance, Sit to/from stand Lower Body Dressing Details (indicate cue type and reason): assist to don socks, min assist + 2 in standing but relies on BUE support Toilet Transfer: Maximal assistance, +2 for physical assistance Toilet Transfer Details (indicate cue type and reason): simulated via functional mobility, heavy L lean Functional mobility during ADLs: Moderate assistance, Maximal assistance, +2 for physical assistance, +2 for safety/equipment, Rolling walker (2 wheels) General ADL Comments: ADL participation limited by impaired cog, increased pain, impaired balacne and decreased activity tolerance  Cognition: Cognition Overall Cognitive Status: Impaired/Different from baseline Arousal/Alertness: Awake/alert Orientation Level: Oriented X4 Attention: Sustained Sustained Attention: Impaired Memory: Impaired Memory Impairment: Decreased short term memory Decreased Short Term Memory: Verbal basic Awareness: Appears intact Problem Solving: Impaired Problem Solving Impairment: Verbal complex Safety/Judgment: Appears intact Cognition Arousal/Alertness: Awake/alert Behavior During Therapy: Flat affect Overall Cognitive Status: Impaired/Different from baseline Area of Impairment: Following commands, Safety/judgement, Orientation, Attention, Memory, Problem solving, Awareness Orientation Level: Disoriented to, Time Current Attention Level: Sustained Memory: Decreased short-term memory Following Commands: Follows multi-step commands with increased time Safety/Judgement: Decreased awareness of safety, Decreased awareness of  deficits Awareness: Emergent Problem Solving: Slow processing, Difficulty sequencing, Requires verbal cues, Requires tactile cues General Comments: cognition continues to improve   Blood pressure 128/79, pulse 64, temperature 97.6 F (36.4 C), temperature source Oral, resp. rate 17, weight 96.7 kg, SpO2 94 %. Physical Exam Vitals and nursing note reviewed.  Constitutional:      General: He is not in acute distress.    Appearance: Normal appearance. He is normal weight.     Comments: Awake, alert, sitting up in bed; appropriate, c/o rib pain- NAD  HENT:     Head: Normocephalic and atraumatic.     Right Ear: External ear normal.     Left Ear: External ear normal.     Nose: Nose normal. No congestion.     Mouth/Throat:     Mouth: Mucous membranes are dry.     Pharynx: Oropharynx is clear. No oropharyngeal exudate.  Eyes:     General:        Right eye: No discharge.  Left eye: No discharge.     Extraocular Movements: Extraocular movements intact.     Comments: Fatty deposits outer cantus bilaterally- chronic  Cardiovascular:     Rate and Rhythm: Normal rate. Rhythm irregular.     Heart sounds: Normal heart sounds. No murmur heard.    No gallop.     Comments: TTP over ribs B/L  Pulmonary:     Effort: Pulmonary effort is normal. No respiratory distress.     Breath sounds: Normal breath sounds. No wheezing, rhonchi or rales.  Abdominal:     General: Bowel sounds are normal. There is no distension.     Palpations: Abdomen is soft.     Tenderness: There is abdominal tenderness (RLQ with referred pain to ribs?).  Musculoskeletal:        General: Normal range of motion.     Cervical back: Neck supple. No tenderness.     Comments: 5-/5 throughout  Skin:    General: Skin is warm and dry.     Comments: IV R forearm- looks OK Blanchable spot on R inner buttocks  Neurological:     General: No focal deficit present.     Mental Status: He is alert and oriented to person, place,  and time.  Psychiatric:        Mood and Affect: Mood normal.        Behavior: Behavior normal.     Results for orders placed or performed during the hospital encounter of 08/30/22 (from the past 48 hour(s))  Glucose, capillary     Status: Abnormal   Collection Time: 09/09/22 11:25 AM  Result Value Ref Range   Glucose-Capillary 177 (H) 70 - 99 mg/dL    Comment: Glucose reference range applies only to samples taken after fasting for at least 8 hours.  Glucose, capillary     Status: Abnormal   Collection Time: 09/09/22  4:08 PM  Result Value Ref Range   Glucose-Capillary 166 (H) 70 - 99 mg/dL    Comment: Glucose reference range applies only to samples taken after fasting for at least 8 hours.  Glucose, capillary     Status: Abnormal   Collection Time: 09/09/22  8:15 PM  Result Value Ref Range   Glucose-Capillary 168 (H) 70 - 99 mg/dL    Comment: Glucose reference range applies only to samples taken after fasting for at least 8 hours.  Comprehensive metabolic panel     Status: Abnormal   Collection Time: 09/10/22  6:16 AM  Result Value Ref Range   Sodium 136 135 - 145 mmol/L   Potassium 4.1 3.5 - 5.1 mmol/L   Chloride 106 98 - 111 mmol/L   CO2 25 22 - 32 mmol/L   Glucose, Bld 157 (H) 70 - 99 mg/dL    Comment: Glucose reference range applies only to samples taken after fasting for at least 8 hours.   BUN 11 8 - 23 mg/dL   Creatinine, Ser 0.83 0.61 - 1.24 mg/dL   Calcium 8.2 (L) 8.9 - 10.3 mg/dL   Total Protein 5.9 (L) 6.5 - 8.1 g/dL   Albumin 2.4 (L) 3.5 - 5.0 g/dL   AST 49 (H) 15 - 41 U/L   ALT 78 (H) 0 - 44 U/L   Alkaline Phosphatase 147 (H) 38 - 126 U/L   Total Bilirubin 0.5 0.3 - 1.2 mg/dL   GFR, Estimated >60 >60 mL/min    Comment: (NOTE) Calculated using the CKD-EPI Creatinine Equation (2021)    Anion gap 5  5 - 15    Comment: Performed at [facility], [place] [address] [place], [place] [address]  CBC     Status: Abnormal   Collection Time: 09/10/22  6:16 AM   Result Value Ref Range   WBC 13.6 (H) 4.0 - 10.5 K/uL   RBC 3.67 (L) 4.22 - 5.81 MIL/uL   Hemoglobin 12.0 (L) 13.0 - 17.0 g/dL   HCT 35.7 (L) 39.0 - 52.0 %   MCV 97.3 80.0 - 100.0 fL   MCH 32.7 26.0 - 34.0 pg   MCHC 33.6 30.0 - 36.0 g/dL   RDW 23.5 (H) 11.5 - 15.5 %   Platelets 244 150 - 400 K/uL   nRBC 0.0 0.0 - 0.2 %    Comment: Performed at [facility] [address], [place], [place] [address]  Glucose, capillary     Status: Abnormal   Collection Time: 09/10/22  7:49 AM  Result Value Ref Range   Glucose-Capillary 185 (H) 70 - 99 mg/dL    Comment: Glucose reference range applies only to samples taken after fasting for at least 8 hours.  Glucose, capillary     Status: Abnormal   Collection Time: 09/10/22 11:45 AM  Result Value Ref Range   Glucose-Capillary 167 (H) 70 - 99 mg/dL    Comment: Glucose reference range applies only to samples taken after fasting for at least 8 hours.  Glucose, capillary     Status: Abnormal   Collection Time: 09/10/22  4:45 PM  Result Value Ref Range   Glucose-Capillary 149 (H) 70 - 99 mg/dL    Comment: Glucose reference range applies only to samples taken after fasting for at least 8 hours.   Comment 1 Notify RN   Glucose, capillary     Status: Abnormal   Collection Time: 09/10/22  9:43 PM  Result Value Ref Range   Glucose-Capillary 112 (H) 70 - 99 mg/dL    Comment: Glucose reference range applies only to samples taken after fasting for at least 8 hours.   Comment 1 Notify RN   Glucose, capillary     Status: Abnormal   Collection Time: 09/11/22  7:29 AM  Result Value Ref Range   Glucose-Capillary 119 (H) 70 - 99 mg/dL    Comment: Glucose reference range applies only to samples taken after fasting for at least 8 hours.   No results found.    Blood pressure 128/79, pulse 64, temperature 97.6 F (36.4 C), temperature source Oral, resp. rate 17, weight 96.7 kg, SpO2 94 %.  Medical Problem List and Plan: 1. Functional deficits  secondary to PEA arrest/debility  -patient may  shower  -ELOS/Goals: 10-14 days- supervision to min A 2.  Antithrombotics: -DVT/anticoagulation:  Pharmaceutical: Other (comment) Eliquis.   -antiplatelet therapy: Plavix.  3. Pain Management: Oxycodone prn.  4. Mood/Behavior/Sleep: LCSW to follow for evaluation and support.   --Ambien helps him stay asleep longer (better than trazodone)  -antipsychotic agents: N/A 5. Neuropsych/cognition: This patient is capable of making decisions on his own behalf. 6. Skin/Wound Care: Routine pressure relief measures.  7. Fluids/Electrolytes/Nutrition: Monitor I/O. Check CMET in am.  8. H/o new seizures: Continue Keppr 1500 mg BID 9. CAD/ICM/chronic combined CHF: Monitor for signs of overload.  --Continue Lipitor, Farxiga, Lasix, Entresto, atorvastatin and Plavix.   10. V tach s/p AICD: On amiodarone and Metoprolol. Me 11. A fib: Monitor HR TID. On eliquis, amiodarone and metoprolol 12.  Rib fractures: Encourage pulmonary hygeine.  13. Colon Ca  w/mets: Xeloda to resume after discharge.    I have personally performed a face to face diagnostic evaluation of this patient and formulated the key components of the plan.  Additionally, I have personally reviewed laboratory data, imaging studies, as well as relevant notes and concur with the physician assistant's documentation above.   The patient's status has not changed from the original H&P.  Any changes in documentation from the acute care chart have been noted above.     Bary Leriche, PA-C 09/11/2022

## 2022-09-11 NOTE — Discharge Summary (Signed)
Physician Discharge Summary  [facility] ZJQ:[id number] DOB: 11-Dec-1961 DOA: 08/30/2022  PCP: Patient, No Pcp Per  Admit date: 08/30/2022 Discharge date: 09/11/2022  Time spent: 20 minutes  Recommendations for Outpatient Follow-up:  Recommend Chem-12 CBC in 1 week as well as magnesium Note new dosages of amiodarone/Keppra as well as discontinuation of mexiletine  Will CC oncologist as an outpatient as well need to be followed for PET scans and reinitiation of Xeloda as an outpatient  Discharge Diagnoses:  MAIN problem for hospitalization   PEA cardiac arrest Seizures this admission HFrEF, A-fib Metastatic colon cancer  Please see below for itemized issues addressed in [place]- refer to other progress notes for clarity if needed  Discharge Condition: Fair  Diet recommendation: Improved  Filed Weights   09/04/22 0500 09/07/22 2200 09/10/22 2128  Weight: 101.9 kg 104.8 kg 96.7 kg    History of present illness:  61yo who presented  after seizure and brief PEA arrest (known seizure disorder). Weaned extubated with full neurological recovery. HFrEF with ICD, restarted on HF meds, not decompensated. Cardiology was consulted and following  Hospital Course:  PEA cardiac arrest -following aspiration event following seizure -seems stable -Cardiology following. Discussed with Cardiology who reviewed updated echo, no significant change -Cardiology recs to cont treatment for CAD/CHF/VT with home meds -Will need short-term opiates on discharge-in retrospect suspect that some of his pain may be related to his malignancy although he did sustain CPR-try to menance by time of discharge from rehab   Seizures -Last seen by Neuro 9/20 with recs to continue keppra 1.5g bid at which point Neuro had signed off. Keppra dose adjusted to 1.5 g twice daily -remains stable thus far, no seizures since 9/22 -Pt medically clear for CIR at this time   Aspiration PNA -completed course of  abx   Severe protein calorie malnutrition -Cont PO and supplemental tube feeding as tolerated -Initially on tube feeds-NG feed d/c'd on 9/25   HFrEF, recurrent VT, Afib on eliquis -Seen by Cardiology who has signed off. -Continue amiodarone 400 daily, metoprolol 50 twice daily -Heart failure seems compensated-continue Lasix 40 orally atorvastatin 80 Entresto 1 twice daily   Metastatic colon cancer stage III/IV pT3, N2B with indeterminate pancreatic and adrenal lesions and liver mets -continue analgesia as needed -on Xeloda as outpt -We will need repeat scan after 4 cycles of Xeloda    Discharge Exam: Vitals:   09/11/22 0531 09/11/22 0800  BP: (!) 143/106 128/79  Pulse: 62 64  Resp: 18 17  Temp: 98.5 F (36.9 C) 97.6 F (36.4 C)  SpO2: 97% 94%    Subj on day of d/c   Awake coherent no distress sitting up in bed no fever Mild pain in abdomen  General Exam on discharge  EOMI NCAT no focal deficit no icterus, some changes to sclera of both eyes S1-S2 no murmur rub no gallop Chest is clear no added sound no lower extremity edema No rash psych euthymic  Discharge Instructions   Discharge Instructions     Diet - low sodium heart healthy   Complete by: As directed    Increase activity slowly   Complete by: As directed    No wound care   Complete by: As directed    Cover with mepilex      Allergies as of 09/11/2022   No Known Allergies      Medication List     STOP taking these medications    capecitabine 500 MG tablet Commonly known as:  XELODA   methocarbamol 500 MG tablet Commonly known as: ROBAXIN   mexiletine 150 MG capsule Commonly known as: MEXITIL   sildenafil 50 MG tablet Commonly known as: Viagra   traZODone 100 MG tablet Commonly known as: DESYREL       TAKE these medications    acetaminophen 325 MG tablet Commonly known as: TYLENOL Take 1-2 tablets (325-650 mg total) by mouth every 4 (four) hours as needed for mild pain.    ALPRAZolam 0.25 MG tablet Commonly known as: XANAX Take 1 tablet (0.25 mg total) by mouth at bedtime as needed for anxiety or sleep.   amiodarone 400 MG tablet Commonly known as: PACERONE Take 1 tablet (400 mg total) by mouth daily. Start taking on: September 12, 2022 What changed:  medication strength how much to take   atorvastatin 80 MG tablet Commonly known as: LIPITOR Take 1 tablet (80 mg total) by mouth every evening.   clopidogrel 75 MG tablet Commonly known as: PLAVIX Take 1 tablet (75 mg total) by mouth daily.   dapagliflozin propanediol 10 MG Tabs tablet Commonly known as: Farxiga Take 1 tablet (10 mg total) by mouth daily.   diclofenac Sodium 1 % Gel Commonly known as: VOLTAREN Apply 2 g topically 4 (four) times daily. What changed:  when to take this reasons to take this   docusate sodium 100 MG capsule Commonly known as: COLACE Take 1 capsule (100 mg total) by mouth 2 (two) times daily.   Eliquis 5 MG Tabs tablet Generic drug: apixaban TAKE 1 TABLET(5 MG) BY MOUTH TWICE DAILY What changed: See the new instructions.   ezetimibe 10 MG tablet Commonly known as: ZETIA Take 1 tablet (10 mg total) by mouth daily.   feeding supplement Liqd Take 237 mLs by mouth 2 (two) times daily between meals.   furosemide 40 MG tablet Commonly known as: LASIX Take 1 tablet (40 mg total) by mouth daily.   levETIRAcetam 750 MG tablet Commonly known as: KEPPRA Take 2 tablets (1,500 mg total) by mouth 2 (two) times daily. What changed:  medication strength how much to take how to take this when to take this additional instructions   levothyroxine 50 MCG tablet Commonly known as: SYNTHROID Take 1 tablet (50 mcg total) by mouth daily at 6 (six) AM. Start taking on: September 12, 2022   metoprolol succinate 50 MG 24 hr tablet Commonly known as: TOPROL-XL Take 1 tablet (50 mg total) by mouth 2 (two) times daily. Take with or immediately following a meal.    nystatin powder Commonly known as: MYCOSTATIN/NYSTOP Apply topically 2 (two) times daily. Apply To groin area   oxyCODONE-acetaminophen 5-325 MG tablet Commonly known as: PERCOCET/ROXICET Take 1 tablet by mouth every 6 (six) hours as needed for moderate pain or severe pain.   pantoprazole 40 MG tablet Commonly known as: PROTONIX Take 40 mg by mouth daily.   polyethylene glycol 17 g packet Commonly known as: MIRALAX / GLYCOLAX Take 17 g by mouth 2 (two) times daily. What changed:  when to take this reasons to take this   potassium chloride 10 MEQ tablet Commonly known as: KLOR-CON M Take 1 tablet (10 mEq total) by mouth daily.   sacubitril-valsartan 97-103 MG Commonly known as: ENTRESTO Take 1 tablet by mouth 2 (two) times daily.       No Known Allergies    The results of significant diagnostics from this hospitalization (including imaging, microbiology, ancillary and laboratory) are listed below for reference.  Significant Diagnostic Studies: ECHOCARDIOGRAM LIMITED  Result Date: 09/05/2022    ECHOCARDIOGRAM LIMITED REPORT   Patient Name:   Gregory [name] Date of Exam: 09/05/2022 Medical Rec #:  [id number]                 Height:       78.0 in Accession #:    [id number]                Weight:       224.6 lb Date of Birth:  [birth year]                 BSA:          2.372 m Patient Age:    71 years                  BP:           129/86 mmHg Patient Gender: M                         HR:           73 bpm. Exam Location:  Inpatient Procedure: Limited Color Doppler and Limited Echo Indications:    CHF-Acute Systolic  History:        Patient has prior history of Echocardiogram examinations, most                 recent 08/30/2022. CHF, CAD, Arrythmias:Atrial Fibrillation and                 Tachycardia; Risk Factors:Hypertension and Diabetes.  Sonographer:    Ronny Flurry Sonographer#2:  Danne Baxter RDCS, FE, PE Referring Phys: [id number] [place]  1.  Basal inferior/inferolateral aneurysm with dyskinesia of most of the inferior and inferolateral walls and basal-mid anterolateral hypokinesis. This corresponds to infarction/scar in the right coronary and circumflex artery territory. Normal contractility is preserved in the LAD artery distribution. No thrombus is seen. Left ventricular ejection fraction, by estimation, is 35 to 40%. The left ventricle has moderately decreased function. Left ventricular diastolic parameters are consistent with Grade I diastolic dysfunction (impaired relaxation).  2. Left atrial size was severely dilated.  3. Right atrial size was mild to moderately dilated.  4. Large pleural effusion in the left lateral region.  5. The mitral valve is normal in structure. Trivial mitral valve regurgitation.  6. The aortic valve is tricuspid. Aortic valve regurgitation is not visualized. Aortic valve sclerosis/calcification is present, without any evidence of aortic stenosis.  7. Aortic dilatation noted. There is mild dilatation of the aortic root, measuring 42 mm. Comparison(s): Prior images reviewed side by side. Changes from prior study are noted. The left ventricular function is unchanged. The left ventricular wall motion abnormality is unchanged. FINDINGS  Left Ventricle: Basal inferior/inferolateral aneurysm with dyskinesia of most of the inferior and inferolateral walls and basal-mid anterolateral hypokinesis. This corresponds to infarction/scar in the right coronary and circumflex artery territory. Normal contractility is preserved in the LAD artery distribution. No thrombus is seen. Left ventricular ejection fraction, by estimation, is 35 to 40%. The left ventricle has moderately decreased function. The left ventricular internal cavity size was normal in size. Left ventricular diastolic parameters are consistent with Grade I diastolic dysfunction (impaired relaxation). Normal left ventricular filling pressure.  LV Wall Scoring: The basal  inferolateral segment and basal inferior segment are aneurysmal. The mid and distal inferior wall and mid inferolateral segment are dyskinetic. The antero-lateral wall,  mid inferoseptal segment, and basal inferoseptal segment are hypokinetic. The entire anterior wall, entire anterior septum, apical lateral segment, and apex are normal. Left Atrium: Left atrial size was severely dilated. Right Atrium: Right atrial size was mild to moderately dilated. Pericardium: There is no evidence of pericardial effusion. Mitral Valve: The mitral valve is normal in structure. Trivial mitral valve regurgitation. Tricuspid Valve: The tricuspid valve is normal in structure. Tricuspid valve regurgitation is not demonstrated. Aortic Valve: The aortic valve is tricuspid. Aortic valve regurgitation is not visualized. Aortic valve sclerosis/calcification is present, without any evidence of aortic stenosis. Pulmonic Valve: The pulmonic valve was grossly normal. Pulmonic valve regurgitation is not visualized. Aorta: Aortic dilatation noted. There is mild dilatation of the aortic root, measuring 42 mm. Additional Comments: There is a large pleural effusion in the left lateral region. LEFT VENTRICLE PLAX 2D LVIDd:         5.51 cm Diastology LVIDs:         4.50 cm LV e' medial:    4.90 cm/s LV PW:         1.00 cm LV E/e' medial:  5.4 LV IVS:        1.60 cm LV e' lateral:   7.29 cm/s                        LV E/e' lateral: 3.7  RIGHT VENTRICLE RV S prime:     11.30 cm/s TAPSE (M-mode): 2.1 cm MV E velocity: 26.68 cm/s MV A velocity: 71.30 cm/s MV E/A ratio:  0.37 [name] Croitoru MD Electronically signed by Sanda Klein MD Signature Date/Time: 09/05/2022/3:32:28 PM    Final    US Abdomen Limited RUQ (LIVER/GB)  Result Date: 09/03/2022 CLINICAL DATA:  Biliary obstruction. EXAM: ULTRASOUND ABDOMEN LIMITED RIGHT UPPER QUADRANT COMPARISON:  None Available. FINDINGS: Gallbladder: A 1.3 cm shadowing, echogenic gallstone is seen within the  dependent portion of the gallbladder lumen. The gallbladder wall measures 4.56 mm in thickness. No sonographic Murphy sign noted by sonographer. Common bile duct: Diameter: 3.8 mm Liver: No focal lesion identified. Within normal limits in parenchymal echogenicity. Portal vein is patent on color Doppler imaging with normal direction of blood flow towards the liver. Other: A mild amount of abdominal free fluid is noted. Of incidental note is the presence of a 2.4 cm simple cyst within the upper pole of the right kidney. IMPRESSION: 1. Cholelithiasis and mild gallbladder wall thickening, without evidence of acute cholecystitis. 2. Mild amount of abdominal free fluid which may represent the source of the previously noted gallbladder wall thickening. 3. Simple right renal cyst. No additional follow-up imaging is recommended. Electronically Signed   By: Virgina Norfolk M.D.   On: 09/03/2022 22:28   DG Abd Portable 1V  Result Date: 09/03/2022 CLINICAL DATA:  [id number]. Encounter for nasogastric (NG) tube placement EXAM: PORTABLE ABDOMEN - 1 VIEW COMPARISON:  X-ray abdomen 09/02/2022 FINDINGS: Partially visualized triple lead cardiac pacemaker and defibrillator. Interval advancement of an enteric tube bat courses below the hemidiaphragm with tip overlying the expected region of the second portion of the duodenum. The bowel gas pattern is normal. No radio-opaque calculi or other significant radiographic abnormality are seen. IMPRESSION: Enteric tube courses below the hemidiaphragm with tip overlying the expected region of the second portion of the duodenum. Electronically Signed   By: Iven Finn M.D.   On: 09/03/2022 19:20   DG Abd Portable 1V  Result Date: 09/02/2022 CLINICAL DATA:  Feeding tube  placement. EXAM: PORTABLE ABDOMEN - 1 VIEW COMPARISON:  Radiographs 09/02/2022 and 08/31/2022.  CT 07/16/2022. FINDINGS: 1445 hours. Tip of the feeding tube projects over the mid lumbar spine, consistent with position in  the distal stomach. Left upper quadrant embolization coils and cardiac defibrillator leads are noted. The visualized bowel gas pattern is nonobstructive. IMPRESSION: Tip of the feeding tube projects over the mid abdomen, consistent with position in the distal stomach. Electronically Signed   By: Richardean Sale M.D.   On: 09/02/2022 14:54   DG Abd Portable 1V  Result Date: 09/02/2022 CLINICAL DATA:  Check gastric catheter placed EXAM: PORTABLE ABDOMEN - 1 VIEW COMPARISON:  08/31/2022 FINDINGS: Gastric catheter is noted extending into the third portion of the duodenum. Scattered large and small bowel gas is noted. No free air is noted. IMPRESSION: Gastric catheter deep in the duodenum Electronically Signed   By: Inez Catalina M.D.   On: 09/02/2022 02:33   DG CHEST PORT 1 VIEW  Result Date: 09/01/2022 CLINICAL DATA:  Endotracheal tube placement. EXAM: PORTABLE CHEST 1 VIEW COMPARISON:  09/01/2022. FINDINGS: The heart size and mediastinal contours are stable. Minimal subsegmental atelectasis at the left lung base. No consolidation, effusion, or pneumothorax. A stable multi lead pacemaker device is present over the left chest. An endotracheal tube terminates 6.2 cm above the carina. An enteric tube courses over the midline and out of the field of view. IMPRESSION: 1. Mild subsegmental atelectasis at the left lung base. 2. Support apparatus as described above. Electronically Signed   By: [name] M.D.   On: 09/01/2022 01:36   DG CHEST PORT 1 VIEW  Result Date: 09/01/2022 CLINICAL DATA:  Endotracheal 2 placement EXAM: PORTABLE CHEST 1 VIEW COMPARISON:  09/01/2019 FINDINGS: Support Apparatus: --Endotracheal tube: Tip at the level of the clavicular heads. --Enteric tube:Tip and sideport are below the field of view. --Vascular catheter(s):None --Other: Left chest wall 3 lead AICD The heart size and mediastinal contours are within normal limits. The lungs are clear. No pleural effusion or pneumothorax.  IMPRESSION: Endotracheal tube tip at the level of the clavicular heads. Electronically Signed   By: Ulyses Jarred M.D.   On: 09/01/2022 00:59   CT HEAD WO CONTRAST (5MM)  Result Date: 08/31/2022 CLINICAL DATA:  Acute neurologic deficit EXAM: CT HEAD WITHOUT CONTRAST TECHNIQUE: Contiguous axial images were obtained from the base of the skull through the vertex without intravenous contrast. RADIATION DOSE REDUCTION: This exam was performed according to the departmental dose-optimization program which includes automated exposure control, adjustment of the mA and/or kV according to patient size and/or use of iterative reconstruction technique. COMPARISON:  08/30/2022 FINDINGS: Brain: There is no mass, hemorrhage or extra-axial collection. The size and configuration of the ventricles and extra-axial CSF spaces are normal. There is hypoattenuation of the white matter, most commonly indicating chronic small vessel disease. Partially empty sella. Vascular: No abnormal hyperdensity of the major intracranial arteries or dural venous sinuses. No intracranial atherosclerosis. Skull: The visualized skull base, calvarium and extracranial soft tissues are normal. Sinuses/Orbits: No fluid levels or advanced mucosal thickening of the visualized paranasal sinuses. No mastoid or middle ear effusion. The orbits are normal. IMPRESSION: Chronic small vessel disease without acute intracranial abnormality. Electronically Signed   By: Ulyses Jarred M.D.   On: 08/31/2022 21:09   DG Abd Portable 1V  Result Date: 08/31/2022 CLINICAL DATA:  Evaluate location of enteric tube EXAM: PORTABLE ABDOMEN - 1 VIEW COMPARISON:  08/30/2022 FINDINGS: Tip of enteric tube is noted  in the fundus of the stomach pointing cephalad. There is a kink in the distal course of enteric tube in the stomach. This finding has not changed. Bowel gas pattern in the upper abdomen is essentially unremarkable. There is moderate amount of gas in colon. Stomach is not  distended. IMPRESSION: Tip of enteric tube is seen in the fundus of the stomach pointing cephalad. There is a kink in the distal course of enteric tube. No interval changes are noted since 08/30/2022. Electronically Signed   By: Elmer Picker M.D.   On: 08/31/2022 11:55   Overnight EEG with video  Result Date: 08/31/2022 Lora Havens, MD     09/01/2022  9:49 AM Patient Name: Gregory [name] MRN: [id number] Epilepsy Attending: Lora Havens Referring Physician/Provider: Katy Apo, NP Duration: 08/30/2022 1947 to 08/31/2022 1947 Patient history: 61yo M Patient with known hx epilepsy presented in focal status epilepticus. EEG to evaluate for seizure Level of alertness: lethargic/sedated, asleep AEDs during EEG study: LEV Technical aspects: This EEG study was done with scalp electrodes positioned according to the 10-20 International system of electrode placement. Electrical activity was reviewed with band pass filter of 1-'70Hz'$ , sensitivity of 7 uV/mm, display speed of 52m/sec with a '60Hz'$  notched filter applied as appropriate. EEG data were recorded continuously and digitally stored.  Video monitoring was available and reviewed as appropriate. Description: No posterior dominant rhythm was seen. Sleep was characterized by sleep spindles (12 to 14 Hz), maximal frontocentral region. EEG showed continuous generalized 3 to 6 Hz theta-delta slowing admixed with intermittent generalized 13-'15Hz'$  beta activity. Event button was pressed on 08/31/2022 at 0215. Per RN, patient had rhythmic eye closing and abdominal movements that were difficult to visualize on camera.  Concomitant EEG before, during and after the event did not show any EEG changes suggest seizure. Hyperventilation and photic stimulation were not performed.   ABNORMALITY. - Continuous slow, generalized IMPRESSION: This study is suggestive of severe diffuse encephalopathy, nonspecific etiology but could be related to sedation. No  seizures or epileptiform discharges were seen throughout the recording. Event button was pressed on 08/31/2022 at 0215. Per RN, patient had rhythmic eye closing and abdominal movements that were difficult to visualize on camera.  Concomitant EEG did not show any EEG changes suggest seizure. This was most likely NOT an epileptic event. PLora Havens  DG Chest Port 1 View  Result Date: 08/31/2022 CLINICAL DATA:  Aspiration pneumonia. EXAM: PORTABLE CHEST 1 VIEW COMPARISON:  One-view chest x-ray 08/30/2022 FINDINGS: Pacing wires are stable. Endotracheal tube terminates 6.5 cm above the carina. The endotracheal tube terminates in the esophagus. New right middle lobe or lower lobe airspace opacities are present. Lung volumes remain low. Minimal left basilar atelectasis is present. IMPRESSION: 1. New right middle lobe or lower lobe airspace disease compatible with pneumonia. 2. Minimal left basilar atelectasis. 3. The endotracheal tube terminates 6.5 cm above the carina. Electronically Signed   By: CSan MorelleM.D.   On: 08/31/2022 06:36   DG Abd 1 View  Result Date: 08/30/2022 CLINICAL DATA:  Check gastric catheter placement EXAM: ABDOMEN - 1 VIEW COMPARISON:  Film from earlier in the same day. FINDINGS: Gastric catheter has been advanced and is now kinked upon itself at the proximal side port. Scattered large and small bowel gas is noted. No obstructive changes are seen. IMPRESSION: Advancement of gastric catheter now results in kinking at the proximal side port within the gastric lumen. Electronically Signed   By:  Inez Catalina M.D.   On: 08/30/2022 23:19   DG Abd 1 View  Result Date: 08/30/2022 CLINICAL DATA:  Check gastric catheter placement EXAM: ABDOMEN - 1 VIEW COMPARISON:  Film from earlier in the same day. FINDINGS: Gastric catheter is again noted with the tip in the stomach. The proximal side port is just beyond the gastroesophageal junction. The overall appearance is stable from the  prior exam. IMPRESSION: Stable appearance of the gastric catheter. Electronically Signed   By: Inez Catalina M.D.   On: 08/30/2022 22:32   DG Abd 1 View  Result Date: 08/30/2022 CLINICAL DATA:  OG tube EXAM: ABDOMEN - 1 VIEW COMPARISON:  Abdominal x-ray 08/30/2022 FINDINGS: Orogastric tube tip is in the proximal stomach with side hole at the level of the gastroesophageal junction. There is gaseous distention of bowel in the upper abdomen. Embolic coils overlie the left upper quadrant, unchanged. Pacemaker wires are partially visualized. IMPRESSION: Orogastric tube tip is at the level of the proximal stomach with side hole at the gastroesophageal junction. Recommend advancing tube. Electronically Signed   By: Ronney Asters M.D.   On: 08/30/2022 22:08   DG Abd 1 View  Result Date: 08/30/2022 CLINICAL DATA:  OG placement EXAM: ABDOMEN - 1 VIEW COMPARISON:  08/30/2021. FINDINGS: Distended loops of small bowel are noted in the left upper quadrant measuring up to 4 cm. An enteric tube terminates over the stomach, however the side port is above the level of the diaphragm and should be advanced approximately 7 cm. Pacemaker leads are noted over the heart. No radio-opaque calculi or other significant radiographic abnormality are seen. IMPRESSION: 1. Focal loop of distended small bowel in the left upper quadrant measuring 4 cm, possible ileus versus obstruction. 2. The side port of the enteric tube is above the level of the diaphragm and should be advanced approximately 7 cm. Electronically Signed   By: [name] M.D.   On: 08/30/2022 20:01   [place] EKG SITE RITE  Result Date: 08/30/2022 If Site Rite image not attached, placement could not be confirmed due to current cardiac rhythm.  ECHOCARDIOGRAM LIMITED  Result Date: 08/30/2022    ECHOCARDIOGRAM LIMITED REPORT   Patient Name:   Gregory [name] Date of Exam: 08/30/2022 Medical Rec #:  [id number]                 Height:       78.0 in Accession #:     [id number]                Weight:       201.9 lb Date of Birth:  05/02/1961                 BSA:          2.267 m Patient Age:    67 years                  BP:           102/65 mmHg Patient Gender: M                         HR:           70 bpm. Exam Location:  Inpatient Procedure: Limited Echo STAT ECHO Indications:    Cardiac Arrest  History:        Patient has prior history of Echocardiogram examinations, most  recent 04/07/2022. CAD. Respiratory failure requiring mechanical                 ventilation.  Sonographer:    Merrie Roof RDCS Referring Phys: GRACE E BOWSER  Sonographer Comments: Technically difficult study due to poor echo windows, suboptimal parasternal window, suboptimal apical window and echo performed with patient supine and on artificial respirator. IMPRESSIONS  1. Limited echo for LVEF  2. Left ventricular ejection fraction, by estimation, is 35 to 40%. The left ventricle has moderately decreased function. The left ventricle demonstrates regional wall motion abnormalities (see scoring diagram/findings for description). There is severe left ventricular hypertrophy. There is severe hypokinesis of the left ventricular, entire anteroseptal wall, anterior wall and apical segment.  3. Right ventricular systolic function is mildly reduced. The right ventricular size is normal.  4. The tricuspid valve is abnormal. Tricuspid valve regurgitation is mild to moderate. Comparison(s): Prior images unable to be directly viewed, comparison made by report only. Changes from prior study are noted. 04/07/2022: LVEF 35-40%, inferior and lateral hypokinesis. Conclusion(s)/Recommendation(s): Wall motion abnormalities are new compared to prior echo are suggestive of LAD territory ischemia. Critical findings reported to Dr. Valeta Harms and acknowledged at 4:30 pm. FINDINGS  Left Ventricle: Left ventricular ejection fraction, by estimation, is 35 to 40%. The left ventricle has moderately decreased function. The  left ventricle demonstrates regional wall motion abnormalities. Severe hypokinesis of the left ventricular, entire  anteroseptal wall, anterior wall and apical segment. The left ventricular internal cavity size was small. There is severe left ventricular hypertrophy. Right Ventricle: The right ventricular size is normal. No increase in right ventricular wall thickness. Right ventricular systolic function is mildly reduced. Tricuspid Valve: The tricuspid valve is abnormal. Tricuspid valve regurgitation is mild to moderate. Venous: IVC assessment for right atrial pressure unable to be performed due to mechanical ventilation. Additional Comments: A device lead is visualized. LEFT VENTRICLE PLAX 2D LVIDd:         4.00 cm LVIDs:         3.30 cm LV PW:         1.70 cm LV IVS:        1.95 cm  IVC IVC diam: 1.80 cm TRICUSPID VALVE TR Peak grad:   29.8 mmHg TR Vmax:        273.00 cm/s Lyman Bishop MD Electronically signed by Lyman Bishop MD Signature Date/Time: 08/30/2022/4:48:31 PM    Final    DG Abdomen 1 View  Result Date: 08/30/2022 CLINICAL DATA:  Enteric catheter placement EXAM: ABDOMEN - 1 VIEW COMPARISON:  05/13/2022, 08/30/2022 FINDINGS: Frontal view of the abdomen and upper pelvis are obtained. Enteric catheter passes below diaphragm, tip projecting over the gastric fundus and side port projecting at the gastroesophageal junction. Cardiac pacer and external cardiac fibrillator are noted. Bowel gas pattern is unremarkable. No acute bony abnormality. IMPRESSION: 1. Enteric catheter as above, tip projecting over gastric fundus. Electronically Signed   By: Randa Ngo M.D.   On: 08/30/2022 15:45   CT Angio Chest PE W and/or Wo Contrast  Result Date: 08/30/2022 CLINICAL DATA:  Concern for pulmonary embolism. EXAM: CT ANGIOGRAPHY CHEST WITH CONTRAST TECHNIQUE: Multidetector CT imaging of the chest was performed using the standard protocol during bolus administration of intravenous contrast. Multiplanar CT  image reconstructions and MIPs were obtained to evaluate the vascular anatomy. RADIATION DOSE REDUCTION: This exam was performed according to the departmental dose-optimization program which includes automated exposure control, adjustment of the mA and/or kV according to  patient size and/or use of iterative reconstruction technique. CONTRAST:  81m OMNIPAQUE IOHEXOL 350 MG/ML SOLN COMPARISON:  Same day chest radiograph and PET-CT dated 07/24/2022. FINDINGS: Cardiovascular: Satisfactory opacification of the pulmonary arteries to the segmental level. No evidence of pulmonary embolism. The right and left pulmonary arteries are enlarged, each measuring 3.3 cm, suggestive of pulmonary hypertension. Vascular calcifications are seen in the coronary arteries and aortic arch. Normal heart size. No pericardial effusion. A left subclavian device is noted. Mediastinum/Nodes: No enlarged mediastinal, hilar, or axillary lymph nodes. Thyroid gland and esophagus demonstrate no significant findings. Lungs/Pleura: An endotracheal tube terminates in the midthoracic trachea. There is occlusion of the right lower lobe bronchus (series 7 images 92-95) which is favored to reflect aspirated debris. Aspirated debris is seen in the trachea and right mainstem bronchus. There is moderate right lower lobe atelectasis and mild right upper lobe atelectasis. There is a small to moderate left pleural effusion with associated left lower lobe atelectasis. There is no pneumothorax. Upper Abdomen: Multiple hypoechoic lesions in the liver are redemonstrated and consistent with the previously described metastatic lesions from exam dated 07/24/2022. Musculoskeletal: Nondisplaced rib fractures are seen of the bilateral anterior third through seventh ribs. Degenerative changes are seen in the spine. Review of the MIP images confirms the above findings. IMPRESSION: 1. No evidence of pulmonary embolism. 2. Aspirated debris in the trachea and bronchi supplying  the right lower lobe with moderate right lower lobe atelectasis. 3. Small to moderate left pleural effusion with associated left lower lobe atelectasis. 4. Nondisplaced rib fractures of the bilateral anterior third through seventh ribs. 5. Findings suggestive of pulmonary hypertension. Aortic Atherosclerosis (ICD10-I70.0). Electronically Signed   By: TZerita BoersM.D.   On: 08/30/2022 14:26   CT ANGIO HEAD NECK W WO CM W PERF (CODE STROKE)  Result Date: 08/30/2022 CLINICAL DATA:  Acute neuro deficit.  Seizure.  Aphasia. EXAM: CT ANGIOGRAPHY HEAD AND NECK TECHNIQUE: Multidetector CT imaging of the head and neck was performed using the standard protocol during bolus administration of intravenous contrast. Multiplanar CT image reconstructions and MIPs were obtained to evaluate the vascular anatomy. Carotid stenosis measurements (when applicable) are obtained utilizing NASCET criteria, using the distal internal carotid diameter as the denominator. RADIATION DOSE REDUCTION: This exam was performed according to the departmental dose-optimization program which includes automated exposure control, adjustment of the mA and/or kV according to patient size and/or use of iterative reconstruction technique. CONTRAST:  738mOMNIPAQUE IOHEXOL 350 MG/ML SOLN COMPARISON:  None Available. FINDINGS: Suboptimal vascular opacification. Multiple attempts were made. The patient was unstable during the study. CTA NECK FINDINGS Aortic arch: Limited coverage of the aortic arch. Atherosclerotic calcification aortic arch and proximal great vessels. Proximal great vessels patent without stenosis Right carotid system: Atherosclerotic calcification right common carotid artery, right carotid bifurcation. 70% diameter stenosis proximal right internal carotid artery due to calcific stenosis. Left carotid system: Atherosclerotic calcification in the left common carotid artery and left carotid bifurcation without significant stenosis. Mild  atherosclerotic calcification left internal carotid artery. Vertebral arteries: Severe calcific stenosis proximal right vertebral artery which is then patent to the skull base with scattered calcifications. Left vertebral artery widely patent Skeleton: No acute skeletal abnormality. Other neck: Patient is intubated. Retained secretions in the pharynx. No mass in the neck Upper chest: Chest CT reported separately from today. Review of the MIP images confirms the above findings CTA HEAD FINDINGS Anterior circulation: Atherosclerotic calcification and mild stenosis in the cavernous carotid bilaterally. Anterior and middle  cerebral arteries patent bilaterally without stenosis. Hypoplastic right A1 segment Posterior circulation: Severe atherosclerotic calcific stenosis distal right vertebral artery V4 segment. Faint opacification distal to the stenosis. Left vertebral artery patent to the basilar without significant stenosis. Basilar patent. Mild opacification of PICA bilaterally. Superior cerebellar and posterior cerebral arteries patent bilaterally without stenosis. No aneurysm or large vessel occlusion. Venous sinuses: Limited venous enhancement. Anatomic variants: None Review of the MIP images confirms the above findings IMPRESSION: 1. Technically limited study. The patient was unstable during the study and multiple injections were made with suboptimal arterial enhancement. 2. Negative for intracranial large vessel occlusion 3. Extensive extracranial atherosclerotic calcification. Atherosclerotic aortic arch. 4. 70% diameter stenosis proximal right internal carotid artery. Diffuse atherosclerotic disease left carotid without significant stenosis 5. 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 Left vertebral artery patent without stenosis. Electronically Signed   By: Franchot Gallo M.D.   On: 08/30/2022 14:25   DG Chest Port 1 View  Result Date: 08/30/2022 CLINICAL DATA:  Hypoxia,  intubation. EXAM: PORTABLE CHEST 1 VIEW COMPARISON:  Chest radiograph dated 05/08/2022. FINDINGS: The heart is enlarged. A left subclavian approach cardiac device is redemonstrated. Vascular calcifications are seen in the aortic arch. The left costophrenic angle is not imaged. There is mild left basilar atelectasis/airspace disease. The right lung is clear. There is no pleural effusion on the right. There is no pneumothorax on either side. Degenerative changes are seen in the spine. IMPRESSION: Mild left basilar atelectasis/airspace disease. Aortic Atherosclerosis (ICD10-I70.0). Electronically Signed   By: Zerita Boers M.D.   On: 08/30/2022 13:17   CT HEAD CODE STROKE WO CONTRAST  Result Date: 08/30/2022 CLINICAL DATA:  Code stroke. 61 year old male. Metastatic colon cancer. EXAM: CT HEAD WITHOUT CONTRAST TECHNIQUE: Contiguous axial images were obtained from the base of the skull through the vertex without intravenous contrast. RADIATION DOSE REDUCTION: This exam was performed according to the departmental dose-optimization program which includes automated exposure control, adjustment of the mA and/or kV according to patient size and/or use of iterative reconstruction technique. COMPARISON:  Head CT 09/16/2020. FINDINGS: Brain: No midline shift, mass effect, or evidence of intracranial mass lesion. No acute intracranial hemorrhage identified. Stable cerebral volume since 2021. Patchy and scattered bilateral cerebral white matter hypodensity does not appear significantly changed. Possible chronic lacunar infarct in the left thalamus, stable. Chronic partially empty sella. Vascular: Calcified atherosclerosis at the skull base. Difficult a to exclude a hyperdense left MCA branch in the left sylvian fissure which is new since 2021 (series 2, images 13 and 16. No cytotoxic edema identified. Skull: Stable.  No acute osseous abnormality identified. Sinuses/Orbits: Visualized paranasal sinuses and mastoids are stable  and well aerated. Other: No acute orbit or scalp soft tissue finding identified. ASPECTS [name] Stroke Program Early CT Score) Total score (0-10 with 10 being normal): 10 IMPRESSION: 1. Questionable hyperdense Left MCA branch in the sylvian fissure. But no acute intracranial hemorrhage or cortically based infarct identified. ASPECTS 10. 2. Stable non contrast CT appearance of small vessel disease. 3. These results were communicated to Dr. Quinn Axe at 12:43 pm on 08/30/2022 by text page via the [facility] messaging system. Electronically Signed   By: Genevie Ann M.D.   On: 08/30/2022 12:44    Microbiology: No results found for this or any previous visit (from the past 240 hour(s)).   Labs: Basic Metabolic Panel: Recent Labs  Lab 09/05/22 0200 09/05/22 2005 09/06/22 1145 09/07/22 1143 09/08/22 0820 09/09/22 0318 09/10/22 0616  NA  142 141 139 140 141 138 136  K 3.8 4.0 4.2 4.1 3.9 4.4 4.1  CL 109 107 107 109 107 107 106  CO2 28 25 21* '25 28 27 25  '$ GLUCOSE 157* 127* 136* 152* 147* 113* 157*  BUN '10 11 11 11 13 12 11  '$ CREATININE 0.92 0.96 0.83 0.81 0.93 0.79 0.83  CALCIUM 8.2* 8.2* 8.1* 8.1* 8.4* 8.1* 8.2*  MG  --  2.2 2.3  --   --   --   --   PHOS 1.9* 3.0 2.6  --   --   --   --    Liver Function Tests: Recent Labs  Lab 09/05/22 2005 09/07/22 1143 09/08/22 0820 09/09/22 0318 09/10/22 0616  AST 30 43* 35 39 49*  ALT 143* 95* 83* 70* 78*  ALKPHOS 114 145* 173* 141* 147*  BILITOT 0.8 0.7 0.6 0.6 0.5  PROT 6.5 5.8* 6.2* 5.8* 5.9*  ALBUMIN 2.8* 2.4* 2.5* 2.4* 2.4*   No results for input(s): "LIPASE", "AMYLASE" in the last 168 hours. No results for input(s): "AMMONIA" in the last 168 hours. CBC: Recent Labs  Lab 09/05/22 0200 09/07/22 1143 09/08/22 0820 09/09/22 0318 09/10/22 0616  WBC 10.0 10.2 11.2* 11.5* 13.6*  HGB 11.3* 11.2* 11.9* 11.1* 12.0*  HCT 34.3* 33.8* 37.6* 33.0* 35.7*  MCV 96.3 95.8 100.5* 96.8 97.3  PLT 138* 175 217 209 244   Cardiac Enzymes: No results for  input(s): "CKTOTAL", "CKMB", "CKMBINDEX", "TROPONINI" in the last 168 hours. BNP: BNP (last 3 results) Recent Labs    04/11/22 2107 08/30/22 1447  BNP 616.2* 607.0*    ProBNP (last 3 results) No results for input(s): "PROBNP" in the last 8760 hours.  CBG: Recent Labs  Lab 09/10/22 0749 09/10/22 1145 09/10/22 1645 09/10/22 2143 09/11/22 0729  GLUCAP 185* 167* 149* 112* 119*       Signed:  Nita Sells MD   [facility] 09/11/2022, 9:32 AM

## 2022-09-12 ENCOUNTER — Other Ambulatory Visit (HOSPITAL_COMMUNITY): Payer: Self-pay

## 2022-09-12 DIAGNOSIS — F419 Anxiety disorder, unspecified: Secondary | ICD-10-CM

## 2022-09-12 DIAGNOSIS — S2249XD Multiple fractures of ribs, unspecified side, subsequent encounter for fracture with routine healing: Secondary | ICD-10-CM

## 2022-09-12 DIAGNOSIS — I469 Cardiac arrest, cause unspecified: Secondary | ICD-10-CM | POA: Diagnosis not present

## 2022-09-12 DIAGNOSIS — I5042 Chronic combined systolic (congestive) and diastolic (congestive) heart failure: Secondary | ICD-10-CM | POA: Diagnosis not present

## 2022-09-12 DIAGNOSIS — E1165 Type 2 diabetes mellitus with hyperglycemia: Secondary | ICD-10-CM | POA: Diagnosis not present

## 2022-09-12 LAB — CBC WITH DIFFERENTIAL/PLATELET
Abs Immature Granulocytes: 0.1 10*3/uL — ABNORMAL HIGH (ref 0.00–0.07)
Basophils Absolute: 0 10*3/uL (ref 0.0–0.1)
Basophils Relative: 0 %
Eosinophils Absolute: 0.1 10*3/uL (ref 0.0–0.5)
Eosinophils Relative: 1 %
HCT: 35.3 % — ABNORMAL LOW (ref 39.0–52.0)
Hemoglobin: 11.7 g/dL — ABNORMAL LOW (ref 13.0–17.0)
Immature Granulocytes: 1 %
Lymphocytes Relative: 7 %
Lymphs Abs: 0.9 10*3/uL (ref 0.7–4.0)
MCH: 32.4 pg (ref 26.0–34.0)
MCHC: 33.1 g/dL (ref 30.0–36.0)
MCV: 97.8 fL (ref 80.0–100.0)
Monocytes Absolute: 0.6 10*3/uL (ref 0.1–1.0)
Monocytes Relative: 5 %
Neutro Abs: 11 10*3/uL — ABNORMAL HIGH (ref 1.7–7.7)
Neutrophils Relative %: 86 %
Platelets: 309 10*3/uL (ref 150–400)
RBC: 3.61 MIL/uL — ABNORMAL LOW (ref 4.22–5.81)
RDW: 23.1 % — ABNORMAL HIGH (ref 11.5–15.5)
WBC: 12.7 10*3/uL — ABNORMAL HIGH (ref 4.0–10.5)
nRBC: 0 % (ref 0.0–0.2)

## 2022-09-12 LAB — GLUCOSE, CAPILLARY
Glucose-Capillary: 102 mg/dL — ABNORMAL HIGH (ref 70–99)
Glucose-Capillary: 104 mg/dL — ABNORMAL HIGH (ref 70–99)
Glucose-Capillary: 113 mg/dL — ABNORMAL HIGH (ref 70–99)
Glucose-Capillary: 139 mg/dL — ABNORMAL HIGH (ref 70–99)

## 2022-09-12 LAB — COMPREHENSIVE METABOLIC PANEL
ALT: 74 U/L — ABNORMAL HIGH (ref 0–44)
AST: 44 U/L — ABNORMAL HIGH (ref 15–41)
Albumin: 2.6 g/dL — ABNORMAL LOW (ref 3.5–5.0)
Alkaline Phosphatase: 170 U/L — ABNORMAL HIGH (ref 38–126)
Anion gap: 3 — ABNORMAL LOW (ref 5–15)
BUN: 12 mg/dL (ref 8–23)
CO2: 27 mmol/L (ref 22–32)
Calcium: 8.2 mg/dL — ABNORMAL LOW (ref 8.9–10.3)
Chloride: 107 mmol/L (ref 98–111)
Creatinine, Ser: 0.83 mg/dL (ref 0.61–1.24)
GFR, Estimated: >60 mL/min (ref >60)
Glucose, Bld: 108 mg/dL — ABNORMAL HIGH (ref 70–99)
Potassium: 4.4 mmol/L (ref 3.5–5.1)
Sodium: 137 mmol/L (ref 135–145)
Total Bilirubin: 0.4 mg/dL (ref 0.3–1.2)
Total Protein: 6.6 g/dL (ref 6.5–8.1)

## 2022-09-12 MED ORDER — ALPRAZOLAM 0.25 MG PO TABS
0.2500 mg | ORAL_TABLET | Freq: Two times a day (BID) | ORAL | Status: DC | PRN
  Administered 2022-09-12 – 2022-09-15 (×4): 0.25 mg via ORAL
  Filled 2022-09-12 (×5): qty 1

## 2022-09-12 NOTE — Progress Notes (Signed)
Patient ID: Gregory [name], male   DOB: January 27, 1961, 61 y.o.   MRN: [id number] Met with the patient to review current situation, secondary risks including HTN, HLD, DM and HF. Reviewed medications and dietary modifications including fluid restrictions. Reviewed skin care and pain management (rib injury). Continue to follow along to discharge to address educational needs to facilitate preparation for discharge home with son. [name]

## 2022-09-12 NOTE — Plan of Care (Signed)
  Problem: RH Balance Goal: LTG Patient will maintain dynamic standing with ADLs (OT) Description: LTG:  Patient will maintain dynamic standing balance with stand by assist during activities of daily living (OT)  Flowsheets (Taken 09/12/2022 1226) LTG: Pt will maintain dynamic standing balance during ADLs with: Supervision/Verbal cueing   Problem: Sit to Stand Goal: LTG:  Patient will perform sit to stand in prep for activites of daily living with assistance level (OT) Description: LTG:  Patient will perform sit to stand in prep for activites of daily living with stand by assistance level (OT) Flowsheets (Taken 09/12/2022 1300) LTG: PT will perform sit to stand in prep for activites of daily living with assistance level: Supervision/Verbal cueing   Problem: RH Grooming Goal: LTG Patient will perform grooming w/assist,cues/equip (OT) Description: LTG: Patient will perform grooming with Mod I, without cues using equipment (OT) Flowsheets (Taken 09/12/2022 1300) LTG: Pt will perform grooming with assistance level of: Supervision/Verbal cueing   Problem: RH Bathing Goal: LTG Patient will bathe all body parts with assist levels (OT) Description: LTG: Patient will bathe all body parts with stand by assist levels (OT) Flowsheets (Taken 09/12/2022 1300) LTG: Pt will perform bathing with assistance level/cueing: Supervision/Verbal cueing   Problem: RH Dressing Goal: LTG Patient will perform upper body dressing (OT) Description: LTG Patient will perform upper body dressing Mod I , without cues (OT). Flowsheets (Taken 09/12/2022 1300) LTG: Pt will perform upper body dressing with assistance level of: Supervision/Verbal cueing Goal: LTG Patient will perform lower body dressing w/assist (OT) Description: LTG: Patient will perform lower body dressing with Mod I,without cues in positioning using equipment (OT) Flowsheets (Taken 09/12/2022 1300) LTG: Pt will perform lower body dressing with assistance  level of: Supervision/Verbal cueing   Problem: RH Toileting Goal: LTG Patient will perform toileting task (3/3 steps) with assistance level (OT) Description: LTG: Patient will perform toileting task (3/3 steps) with SPV assistance level (OT)  Flowsheets (Taken 09/12/2022 1300) LTG: Pt will perform toileting task (3/3 steps) with assistance level: Supervision/Verbal cueing   Problem: RH Simple Meal Prep Goal: LTG Patient will perform simple meal prep w/assist (OT) Description: LTG: Patient will perform simple meal prep with SBA, with cues (OT). Flowsheets (Taken 09/12/2022 1300) LTG: Pt will perform simple meal prep with assistance level of: Supervision/Verbal cueing   Problem: RH Toilet Transfers Goal: LTG Patient will perform toilet transfers w/assist (OT) Description: LTG: Patient will perform toilet transfers with stand by assist, with cues using equipment (OT) Flowsheets (Taken 09/12/2022 1300) LTG: Pt will perform toilet transfers with assistance level of: Supervision/Verbal cueing   Problem: RH Tub/Shower Transfers Goal: LTG Patient will perform tub/shower transfers w/assist (OT) Description: LTG: Patient will perform tub/shower transfers with stand by assist, with cues using equipment (OT) Flowsheets (Taken 09/12/2022 1300) LTG: Pt will perform tub/shower stall transfers with assistance level of: Supervision/Verbal cueing

## 2022-09-12 NOTE — Progress Notes (Signed)
PROGRESS NOTE   Subjective/Complaints: Reports chronic poor sleep, he says trazodone and melatonin have not helped in past and Ambien worked the best.   Review of Systems  Constitutional:  Negative for chills and fever.  HENT:  Negative for congestion.   Respiratory:  Negative for shortness of breath.   Cardiovascular:  Positive for chest pain.  Gastrointestinal:  Negative for abdominal pain, constipation, diarrhea, nausea and vomiting.  Neurological:  Positive for weakness.  Psychiatric/Behavioral:  The patient is nervous/anxious and has insomnia.      Objective:   No results found. Recent Labs    09/10/22 0616 09/12/22 0536  WBC 13.6* 12.7*  HGB 12.0* 11.7*  HCT 35.7* 35.3*  PLT 244 309   Recent Labs    09/10/22 0616 09/12/22 0536  NA 136 137  K 4.1 4.4  CL 106 107  CO2 25 27  GLUCOSE 157* 108*  BUN 11 12  CREATININE 0.83 0.83  CALCIUM 8.2* 8.2*    Intake/Output Summary (Last 24 hours) at 09/12/2022 1254 Last data filed at 09/12/2022 1227 Gross per 24 hour  Intake --  Output 2025 ml  Net -2025 ml     Pressure Injury 08/30/22 Sacrum Mid Stage 1 -  Intact skin with non-blanchable redness of a localized area usually over a bony prominence. (Active)  08/30/22 1730  Location: Sacrum  Location Orientation: Mid  Staging: Stage 1 -  Intact skin with non-blanchable redness of a localized area usually over a bony prominence.  Wound Description (Comments):   Present on Admission: Yes    Physical Exam: Vital Signs Blood pressure (!) 156/93, pulse 70, temperature 98 F (36.7 C), temperature source Oral, resp. rate 18, weight 93 kg, SpO2 94 %.   General: Alert and oriented x 3, No apparent distress HEENT: Head is normocephalic, atraumatic, conjugate gaze, MMM, Fatty deposits outer cantus bilaterally- chronic   Neck: Supple without JVD or lymphadenopathy Heart: Reg rate and rhythm. No murmurs rubs or  gallops, TTP chest wall b/l Chest: CTA bilaterally without wheezes, rales, or rhonchi; no distress Abdomen: Soft, mildly tender, non-distended, bowel sounds positive. Extremities: No clubbing, cyanosis, or edema. Pulses are 2+  Musculoskeletal:        General: Normal range of motion.     Cervical back: Neck supple. No tenderness.     Comments: 5-/5 throughout  Skin:    General: Skin is warm and dry.     Comments: IV R forearm- looks OK Blanchable spot on R inner buttocks  Neurological:     General: No focal deficit present.     Mental Status: He is alert and oriented to person, place, and time.  Psychiatric:        Mood and Affect: Mood normal.        Behavior: Behavior normal.    Assessment/Plan: 1. Functional deficits which require 3+ hours per day of interdisciplinary therapy in a comprehensive inpatient rehab setting. Physiatrist is providing close team supervision and 24 hour management of active medical problems listed below. Physiatrist and rehab team continue to assess barriers to discharge/monitor patient progress toward functional and medical goals  Care Tool:  Bathing    Body  parts bathed by patient: Right arm, Left arm, Chest, Abdomen, Front perineal area, Buttocks, Face, Right upper leg, Left upper leg   Body parts bathed by helper: Left lower leg, Right lower leg     Bathing assist Assist Level: Minimal Assistance - Patient > 75%     Upper Body Dressing/Undressing Upper body dressing   What is the patient wearing?: Pull over shirt    Upper body assist Assist Level: Set up assist    Lower Body Dressing/Undressing Lower body dressing      What is the patient wearing?: Pants     Lower body assist Assist for lower body dressing: Moderate Assistance - Patient 50 - 74%     Toileting Toileting    Toileting assist Assist for toileting: Moderate Assistance - Patient 50 - 74%     Transfers Chair/bed transfer  Transfers assist     Chair/bed  transfer assist level: Contact Guard/Touching assist     Locomotion Ambulation   Ambulation assist      Assist level: Minimal Assistance - Patient > 75% Assistive device: No Device Max distance: 150'   Walk 10 feet activity   Assist     Assist level: Contact Guard/Touching assist Assistive device: No Device   Walk 50 feet activity   Assist    Assist level: Minimal Assistance - Patient > 75% Assistive device: No Device    Walk 150 feet activity   Assist    Assist level: Minimal Assistance - Patient > 75% Assistive device: No Device    Walk 10 feet on uneven surface  activity   Assist     Assist level: Minimal Assistance - Patient > 75% Assistive device: Other (comment) (No AD)   Wheelchair     Assist Is the patient using a wheelchair?: Yes Type of Wheelchair: Manual    Wheelchair assist level: Supervision/Verbal cueing Max wheelchair distance: 150'    Wheelchair 50 feet with 2 turns activity    Assist        Assist Level: Supervision/Verbal cueing   Wheelchair 150 feet activity     Assist      Assist Level: Supervision/Verbal cueing   Blood pressure (!) 156/93, pulse 70, temperature 98 F (36.7 C), temperature source Oral, resp. rate 18, weight 93 kg, SpO2 94 %.  Medical Problem List and Plan: 1. Functional deficits secondary to PEA arrest/debility             -patient may  shower             -ELOS/Goals: 10-14 days- supervision to min A  -Continue CIR 2.  Antithrombotics: -DVT/anticoagulation:  Pharmaceutical: Other (comment) Eliquis.              -antiplatelet therapy: Plavix.  3. Pain Management: Oxycodone prn.  4. Mood/Behavior/Sleep: LCSW to follow for evaluation and support.              --Ambien helps him stay asleep longer (better than trazodone)             -antipsychotic agents: N/A  -Xanax PRN 0.25 BID started for anxiety, appears to be home medication 5. Neuropsych/cognition: This patient is capable of  making decisions on his own behalf. 6. Skin/Wound Care: Routine pressure relief measures.  7. Fluids/Electrolytes/Nutrition: Monitor I/O. Check CMET in am.  8. H/o new seizures: Continue Keppra 1500 mg BID 9. CAD/ICM/chronic combined CHF: Monitor for signs of overload. Daily weights --Continue Lipitor, Farxiga, Lasix, Entresto, atorvastatin and Plavix.   Filed Weights  09/11/22 1553  Weight: 93 kg    10. V tach s/p AICD: On amiodarone and Metoprolol. Me 11. A fib: Monitor HR TID. On eliquis, amiodarone and metoprolol 12.  Rib fractures: Encourage pulmonary hygeine.   -Continue 3x lidocaine patches 13. Colon Ca w/mets: Xeloda to resume after discharge.  14. DM type 2  -Continue farxiga, SSI  LOS: 1 days A FACE TO FACE EVALUATION WAS PERFORMED  Jennye Boroughs 09/12/2022, 12:54 PM

## 2022-09-12 NOTE — Progress Notes (Signed)
Inpatient Rehabilitation  Patient information reviewed and entered into eRehab system by Karry Causer M. Finn Altemose, M.A., CCC/SLP, PPS Coordinator.  Information including medical coding, functional ability and quality indicators will be reviewed and updated through discharge.    

## 2022-09-12 NOTE — Progress Notes (Signed)
Inpatient Rehabilitation Care Coordinator Assessment and Plan Patient Details  Name: Gregory [name] MRN: [id number] Date of Birth: 01/09/1961  Today's Date: 09/12/2022  Hospital Problems: Principal Problem:   Cardiac arrest [facility]) Active Problems:   Seizure [facility])  Past Medical History:  Past Medical History:  Diagnosis Date   AICD (automatic cardioverter/defibrillator) present 2005   CAD (coronary artery disease) 12/01/2013   Chronic combined systolic and diastolic CHF, NYHA class 1 ([place]) 12/01/2013   Erectile dysfunction 12/01/2013   HTN (hypertension) 12/01/2013   Hyperlipidemia 12/01/2013   Ischemic cardiomyopathy 12/01/2013   Presence of permanent cardiac pacemaker    Sleep apnea    Past Surgical History:  Past Surgical History:  Procedure Laterality Date   ACHILLES TENDON REPAIR     lft foot   BIOPSY  04/16/2022   Procedure: BIOPSY;  Surgeon: Yetta Flock, MD;  Location: [place];  Service: Gastroenterology;;   CARDIAC CATHETERIZATION N/A 05/05/2016   Procedure: Left Heart Cath and Coronary Angiography;  Surgeon: Leonie Man, MD;  Location: [place] [facility];  Service: Cardiovascular;  Laterality: N/A;   CARDIAC DEFIBRILLATOR PLACEMENT     CARDIOVERSION N/A 09/13/2020   Procedure: CARDIOVERSION;  Surgeon: Werner Lean, MD;  Location: [place] [facility];  Service: Cardiovascular;  Laterality: N/A;   CARDIOVERSION N/A 09/19/2020   Procedure: CARDIOVERSION;  Surgeon: [name], MD;  Location: [facility] ENDOSCOPY;  Service: Cardiovascular;  Laterality: N/A;   COLON RESECTION N/A 04/18/2022   Procedure: HAND ASSISTED LAPAROSCOPIC RIGHT COLON RESECTION;  Surgeon: Felicie Morn, MD;  Location: [place];  Service: General;  Laterality: N/A;   COLONOSCOPY WITH PROPOFOL N/A 04/16/2022   Procedure: COLONOSCOPY WITH PROPOFOL;  Surgeon: Yetta Flock, MD;  Location: [place];  Service: Gastroenterology;  Laterality: N/A;   EP IMPLANTABLE DEVICE  N/A 12/19/2016   Procedure: ICD Generator Changeout;  Surgeon: [name], MD;  Location: [place] [facility];  Service: Cardiovascular;  Laterality: N/A;   ESOPHAGOGASTRODUODENOSCOPY N/A 04/16/2022   Procedure: ESOPHAGOGASTRODUODENOSCOPY (EGD);  Surgeon: Yetta Flock, MD;  Location: [facility] ENDOSCOPY;  Service: Gastroenterology;  Laterality: N/A;   IR ANGIOGRAM SELECTIVE EACH ADDITIONAL VESSEL  05/09/2022   IR ANGIOGRAM VISCERAL SELECTIVE  05/09/2022   IR CATHETER TUBE CHANGE  05/14/2022   IR CATHETER TUBE CHANGE  06/27/2022   IR EMBO ART  VEN HEMORR LYMPH EXTRAV  INC GUIDE ROADMAPPING  05/09/2022   IR IMAGE GUIDED DRAINAGE PERCUT CATH  PERITONEAL RETROPERIT  05/09/2022   IR PATIENT EVAL TECH 0-60 MINS  06/11/2022   IR RADIOLOGIST EVAL & MGMT  06/26/2022   IR RADIOLOGIST EVAL & MGMT  07/16/2022   IR US GUIDE VASC ACCESS RIGHT  05/09/2022   LEFT HEART CATH AND CORONARY ANGIOGRAPHY N/A 04/17/2022   Procedure: LEFT HEART CATH AND CORONARY ANGIOGRAPHY;  Surgeon: Belva Crome, MD;  Location: [place] [facility];  Service: Cardiovascular;  Laterality: N/A;   POLYPECTOMY  04/16/2022   Procedure: POLYPECTOMY;  Surgeon: Yetta Flock, MD;  Location: [facility] ENDOSCOPY;  Service: Gastroenterology;;   RIGHT/LEFT HEART CATH AND CORONARY ANGIOGRAPHY N/A 09/24/2020   Procedure: RIGHT/LEFT HEART CATH AND CORONARY ANGIOGRAPHY;  Surgeon: Lorretta Harp, MD;  Location: [place] [facility];  Service: Cardiovascular;  Laterality: N/A;   SUBMUCOSAL TATTOO INJECTION  04/16/2022   Procedure: SUBMUCOSAL TATTOO INJECTION;  Surgeon: Yetta Flock, MD;  Location: [facility] ENDOSCOPY;  Service: Gastroenterology;;   WRIST TENODESIS     Social History:  reports that  he quit smoking about 9 years ago. His smoking use included cigarettes. He has a 30.00 pack-year smoking history. He has never used smokeless tobacco. He reports that he does not currently use alcohol. He reports that he does not use drugs.  Family / Support  Systems Marital Status: Single Patient Roles: Parent, Other (Comment) (sibling) Children: Pardon [phone]  Anisha-daughter [phone]  Jessica-daughter [phone] Other Supports: Brother Anticipated Caregiver: Son Ability/Limitations of Caregiver: Son works from home and daughter assists when she can Caregiver Availability: 24/7 Family Dynamics: Close with family and they have assisted him prior to admission. Pt is very independent and does not like others to assist. 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 Both son and daughter are involved   Abuse/Neglect Abuse/Neglect Assessment Can Be Completed: Yes Physical Abuse: Denies Verbal Abuse: Denies Sexual Abuse: Denies Exploitation of patient/patient's resources: Denies Self-Neglect: Denies  Patient response to: Social Isolation - How often do you feel lonely or isolated from those around you?: Never  Emotional Status Pt's affect, behavior and adjustment status: Pt reports feelng better now, he is wanting to get back to his independent level with his rolling walker. He is very independent and will not ask for help due to his stubborness and pride Recent Psychosocial Issues: other health issues-treatment for cancer Psychiatric History: No issues he is one who relies upon himself and is very stoic. Have aksed in the  past ot have neuro-psych see and he declines this, he feels not necessary. Substance Abuse History: No issues  Patient / Family Perceptions, Expectations & Goals Pt/Family understanding of illness & functional limitations: Pt talks with the MD and feels he understands his treatment plan, he needs ot get stronger and hopeful he can while here. Although he may need more than 7 days which his insurance has approved. Son and daughter talk with the medical staff also Premorbid pt/family roles/activities: Father, retired Engineer, structural, son, sibling, Anticipated changes in roles/activities/participation: resume Pt/family expectations/goals: Pt states: " I need to get stronger and back to the level I was before this."  [employer]: Other (Comment) (has had HHPT int he past) Premorbid Home Care/DME Agencies: Other (Comment) (CPAP< cane, rw and tub seat) Transportation available at discharge: children Is the patient able to respond to transportation needs?: Yes In the past 12 months, has lack of transportation kept you from medical appointments or from getting medications?: No In the past 12 months, has lack of transportation kept you from meetings, work, or from getting things needed for daily living?: No  Discharge Planning Living Arrangements: Children Support Systems: Children, Parent, Other relatives, Friends/neighbors Type of Residence: Private residence Insurance Resources: Multimedia programmer (specify) (Workers Comp) Pensions consultant: Other (Comment) 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 Pt is quite familar with CIR this is his third time here and was just here  in 05/2022 Care Coordinator Barriers to Discharge: Insurance for SNF coverage Care Coordinator Anticipated Follow Up Needs: HH/OP  Clinical Impression  This pt is familiar to this worker he was just here in June with debility and went home with JP drain. Son has assisted him in the past and will again if needed. Only has workers comp coverage if has to do with his heart. Will await therapy team and update [name] Workers COmp CM  Elease Hashimoto 09/12/2022, 1:56 PM

## 2022-09-12 NOTE — Evaluation (Signed)
Occupational Therapy Assessment and Plan  Patient Details  Name: Gregory [name] MRN: [id number] Date of Birth: November 25, 1961  OT Diagnosis: muscle weakness (generalized) Rehab Potential: Rehab Potential (ACUTE ONLY): Good ELOS: 10-14 days   Today's Date: 09/12/2022 OT Individual Time: 0900-1013 OT Individual Time Calculation (min): 73 min     Hospital Problem: Principal Problem:   Cardiac arrest [facility]) Active Problems:   Seizure ([place])   Past Medical History:  Past Medical History:  Diagnosis Date   AICD (automatic cardioverter/defibrillator) present 2005   CAD (coronary artery disease) 12/01/2013   Chronic combined systolic and diastolic CHF, NYHA class 1 ([place]) 12/01/2013   Erectile dysfunction 12/01/2013   HTN (hypertension) 12/01/2013   Hyperlipidemia 12/01/2013   Ischemic cardiomyopathy 12/01/2013   Presence of permanent cardiac pacemaker    Sleep apnea    Past Surgical History:  Past Surgical History:  Procedure Laterality Date   ACHILLES TENDON REPAIR     lft foot   BIOPSY  04/16/2022   Procedure: BIOPSY;  Surgeon: Yetta Flock, MD;  Location: [place];  Service: Gastroenterology;;   CARDIAC CATHETERIZATION N/A 05/05/2016   Procedure: Left Heart Cath and Coronary Angiography;  Surgeon: Leonie Man, MD;  Location: [place] CV LAB;  Service: Cardiovascular;  Laterality: N/A;   CARDIAC DEFIBRILLATOR PLACEMENT     CARDIOVERSION N/A 09/13/2020   Procedure: CARDIOVERSION;  Surgeon: Werner Lean, MD;  Location: [place] ENDOSCOPY;  Service: Cardiovascular;  Laterality: N/A;   CARDIOVERSION N/A 09/19/2020   Procedure: CARDIOVERSION;  Surgeon: [name], MD;  Location: [facility] ENDOSCOPY;  Service: Cardiovascular;  Laterality: N/A;   COLON RESECTION N/A 04/18/2022   Procedure: HAND ASSISTED LAPAROSCOPIC RIGHT COLON RESECTION;  Surgeon: Felicie Morn, MD;  Location: [place];  Service: General;  Laterality: N/A;   COLONOSCOPY WITH PROPOFOL N/A  04/16/2022   Procedure: COLONOSCOPY WITH PROPOFOL;  Surgeon: Yetta Flock, MD;  Location: [place];  Service: Gastroenterology;  Laterality: N/A;   EP IMPLANTABLE DEVICE N/A 12/19/2016   Procedure: ICD Generator Changeout;  Surgeon: [name], MD;  Location: [place] CV LAB;  Service: Cardiovascular;  Laterality: N/A;   ESOPHAGOGASTRODUODENOSCOPY N/A 04/16/2022   Procedure: ESOPHAGOGASTRODUODENOSCOPY (EGD);  Surgeon: Yetta Flock, MD;  Location: [facility] ENDOSCOPY;  Service: Gastroenterology;  Laterality: N/A;   IR ANGIOGRAM SELECTIVE EACH ADDITIONAL VESSEL  05/09/2022   IR ANGIOGRAM VISCERAL SELECTIVE  05/09/2022   IR CATHETER TUBE CHANGE  05/14/2022   IR CATHETER TUBE CHANGE  06/27/2022   IR EMBO ART  VEN HEMORR LYMPH EXTRAV  INC GUIDE ROADMAPPING  05/09/2022   IR IMAGE GUIDED DRAINAGE PERCUT CATH  PERITONEAL RETROPERIT  05/09/2022   IR PATIENT EVAL TECH 0-60 MINS  06/11/2022   IR RADIOLOGIST EVAL & MGMT  06/26/2022   IR RADIOLOGIST EVAL & MGMT  07/16/2022   IR US GUIDE VASC ACCESS RIGHT  05/09/2022   LEFT HEART CATH AND CORONARY ANGIOGRAPHY N/A 04/17/2022   Procedure: LEFT HEART CATH AND CORONARY ANGIOGRAPHY;  Surgeon: Belva Crome, MD;  Location: [place] CV LAB;  Service: Cardiovascular;  Laterality: N/A;   POLYPECTOMY  04/16/2022   Procedure: POLYPECTOMY;  Surgeon: Yetta Flock, MD;  Location: [name] ENDOSCOPY;  Service: Gastroenterology;;   RIGHT/LEFT HEART CATH AND CORONARY ANGIOGRAPHY N/A 09/24/2020   Procedure: RIGHT/LEFT HEART CATH AND CORONARY ANGIOGRAPHY;  Surgeon: Lorretta Harp, MD;  Location: [place] CV LAB;  Service: Cardiovascular;  Laterality: N/A;   SUBMUCOSAL TATTOO INJECTION  04/16/2022  Procedure: SUBMUCOSAL TATTOO INJECTION;  Surgeon: Yetta Flock, MD;  Location: [name] ENDOSCOPY;  Service: Gastroenterology;;   WRIST TENODESIS      Assessment & Plan Clinical Impression: Patient is a 61 year old male with history of HTN, VT/ICD s/p AICD, PAF:  Seizure disorder, 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  He became hypoxic requiring intubation and then became pulseless requiring 1 round of CPR with epi as well as high doses of epi.  CT head question hyperdense left MCA branch but CTA was negative for LVO.  EEG showed severe diffuse encephalopathy without seizures or epileptiform discharges.  Dr. Cheral Marker question anoxic brain injury and possibly brain mets and MRI for work-up. .    Patient currently requires mod with basic self-care skills secondary to muscle weakness and decreased cardiorespiratoy endurance.  Prior to hospitalization, patient could complete ADL's  with modified independent .  Patient will benefit from skilled intervention to increase independence with basic self-care skills prior to discharge home with care partner.  Anticipate patient will require intermittent supervision and follow up outpatient.  OT - End of Session Activity Tolerance: Improving Endurance Deficit: Yes Endurance Deficit Description: Patient only tolerating standing for 32 seconds and 26 seconds with RW for balance and 2 minute rest inbetween. Patient reports level of exertion at a 7/10 following times stands post ADL. OT Assessment Rehab Potential (ACUTE ONLY): Good OT Patient demonstrates impairments in the following area(s): Endurance;Motor;Balance;Pain OT Basic ADL's Functional Problem(s): Bathing;Dressing;Toileting OT Advanced ADL's Functional Problem(s): Simple Meal Preparation OT Transfers Functional Problem(s): Toilet;Tub/Shower OT Additional Impairment(s): Fuctional Use of Upper Extremity OT Plan OT Intensity: Minimum of 1-2 x/day, 45 to 90 minutes OT Frequency: 5 out of 7 days OT Duration/Estimated Length of Stay: 10-14 days OT  Treatment/Interventions: [employer] reintegration;Discharge planning;DME/adaptive equipment instruction;Functional mobility training;Pain management;Therapeutic Activities;Self Care/advanced ADL retraining;Patient/family education;UE/LE Coordination activities;Therapeutic Exercise;UE/LE Strength taining/ROM OT Self Feeding Anticipated Outcome(s): Independent OT Basic Self-Care Anticipated Outcome(s): SBA/ Set-up OT Toileting Anticipated Outcome(s): SBA/SPV OT Bathroom Transfers Anticipated Outcome(s): SBA/SPV OT Recommendation Patient destination: Home Follow Up Recommendations: Outpatient OT Equipment Recommended: Rolling walker with 5" wheels;Tub/shower bench;To be determined   OT Evaluation Precautions/Restrictions  Precautions Precautions: Fall Precaution Comments: R Rib pain Restrictions Weight Bearing Restrictions: No General Chart Reviewed: Yes Response to Previous Treatment: Not applicable Family/Caregiver Present: No Vital Signs Therapy Vitals Temp: 98 F (36.7 C) Temp Source: Oral Pulse Rate: 70 Resp: 18 BP: (!) 156/93 Oxygen Therapy SpO2: 94 % O2 Device: Room Air Pain Pain Assessment Pain Scale: 0-10 Pain Score: 8  Pain Location: Rib cage Pain Orientation: Right Pain Descriptors / Indicators: Constant Pain Onset: With Activity Patients Stated Pain Goal: 0 Pain Intervention(s): Medication (See eMAR) Multiple Pain Sites: No Home Living/Prior Functioning Home Living Available Help at Discharge: Family, Available 24 hours/day Type of Home: House Home Access: Stairs to enter [employer] of Steps: 1 Entrance Stairs-Rails: None Home Layout: Two level, Bed/bath upstairs Alternate Level Stairs-Number of Steps: 17 Alternate Level Stairs-Rails: Can reach both, Left, Right Bathroom Shower/Tub: Tub/shower unit Bathroom Toilet: Handicapped height Bathroom Accessibility: Yes Additional Comments: Patient with prior rehabilitation stay  Lives With:  Son IADL History Homemaking Responsibilities:  (Patient PLOF was I with household chores. Son can help at time of D/C) Mode of Transportation: Car, Family Occupation: Retired Type of  Occupation: Engineer, structural Leisure and Hobbies: work out at [employer], rode the bike and lifted free [employer] IADL Comments: Was independent and driving prior to recent illnesses. 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) Vision Baseline Vision/History: 0 No visual deficits Ability to See in Adequate Light: 0 Adequate Patient Visual Report: No change from baseline Vision Assessment?: No apparent visual deficits Ocular Range of Motion: Within Functional Limits Tracking/Visual Pursuits: Able to track stimulus in all quads without difficulty Depth Perception: Undershoots Perception    Praxis   Cognition Cognition Overall Cognitive Status: Within Functional Limits for tasks assessed Arousal/Alertness: Awake/alert Orientation Level: Person;Place;Situation Person: Oriented Place: Oriented Situation: Oriented Memory: Appears intact Memory Impairment: Decreased short term memory Sustained Attention: Appears intact Awareness: Appears intact Problem Solving: Appears intact Problem Solving Impairment: Verbal complex Safety/Judgment: Appears intact Brief Interview for Mental Status (BIMS) Repetition of Three Words (First Attempt): 3 Temporal Orientation: Year: Correct Temporal Orientation: Month: Accurate within 5 days Temporal Orientation: Day: Correct Recall: "Sock": Yes, no cue required Recall: "Blue": Yes, no cue required Recall: "Bed": Yes, no cue required BIMS Summary Score: 15 Sensation Sensation Light Touch:  Appears Intact Hot/Cold: Appears Intact Proprioception: Appears Intact Stereognosis: Appears Intact Additional Comments: Patient reports hand writing is as bad as always. No gross sensory impairments noted. Coordination Gross Motor Movements are Fluid and Coordinated: Yes Fine Motor Movements are Fluid and Coordinated: Yes Finger Nose Finger Test: Over and undershooting bilaterally, however not significant Motor  Motor Motor: Within Functional Limits Motor - Skilled Clinical Observations: Strength assessment limited secondary to R rib pain- Patient seems to guard with functional mobility in order to decrease pain.  Trunk/Postural Assessment  Cervical Assessment Cervical Assessment: Within Functional Limits Thoracic Assessment Thoracic Assessment: Within Functional Limits Lumbar Assessment Lumbar Assessment: Within Functional Limits Postural Control Postural Control: Deficits on evaluation (Delayed secondary to pain)  Balance Balance Balance Assessed: Yes Static Sitting Balance Static Sitting - Balance Support: Feet supported;Bilateral upper extremity supported Static Sitting - Level of Assistance: 6: Modified independent (Device/Increase time) Dynamic Sitting Balance Dynamic Sitting - Balance Support: Feet supported;During functional activity Dynamic Sitting - Level of Assistance: 5: Stand by assistance Static Standing Balance Static Standing - Balance Support: During functional activity;No upper extremity supported Static Standing - Level of Assistance:  (CGA) Dynamic Standing Balance Dynamic Standing - Balance Support: During functional activity;No upper extremity supported Dynamic Standing - Level of Assistance: 4: Min assist Extremity/Trunk Assessment RUE Assessment RUE Assessment: Within Functional Limits Passive Range of Motion (PROM) Comments: Patient with functional PROM Active Range of Motion (AROM) Comments: Shoulder flexion actively limited to 0-85 with the LUE,  0-110 RUE Patient does not report any prior injury or this being his baseline LUE Assessment LUE Assessment: Within Functional Limits  Care Tool Care Tool Self Care Eating   Eating Assist Level: Set up assist    Oral Care    Oral Care Assist Level: Set up assist    Bathing   Body parts bathed by patient: Right arm;Left arm;Chest;Abdomen;Front perineal area;Buttocks;Face;Right upper leg;Left upper leg Body parts bathed by helper: Left lower leg;Right lower leg   Assist Level: Minimal Assistance - Patient > 75%    Upper Body Dressing(including orthotics)   What is the patient wearing?: Pull  over shirt   Assist Level: Set up assist    Lower Body Dressing (excluding footwear)   What is the patient wearing?: Pants Assist for lower body dressing: Moderate Assistance - Patient 50 - 74%    Putting on/Taking off footwear   What is the patient wearing?: Non-skid slipper socks Assist for footwear: Total Assistance - Patient < 25%       Care Tool Toileting Toileting activity   Assist for toileting: Moderate Assistance - Patient 50 - 74%     Care Tool Bed Mobility Roll left and right activity   Roll left and right assist level: Moderate Assistance - Patient 50 - 74%    Sit to lying activity   Sit to lying assist level: Moderate Assistance - Patient 50 - 74%    Lying to sitting on side of bed activity         Care Tool Transfers Sit to stand transfer   Sit to stand assist level: Moderate Assistance - Patient 50 - 74%    Chair/bed transfer   Chair/bed transfer assist level: Moderate Assistance - Patient 50 - 74%     Toilet transfer   Assist Level: Moderate Assistance - Patient 50 - 74%     Care Tool Cognition  Expression of Ideas and Wants Expression of Ideas and Wants: 4. Without difficulty (complex and basic) - expresses complex messages without difficulty and with speech that is clear and easy to understand  Understanding Verbal and Non-Verbal Content Understanding  Verbal and Non-Verbal Content: 4. Understands (complex and basic) - clear comprehension without cues or repetitions   Memory/Recall Ability Memory/Recall Ability : That he or she is in a hospital/hospital unit;Current season;Staff names and faces;Location of own room   Refer to Care Plan for Long Term Goals  SHORT TERM GOAL WEEK 1 OT Short Term Goal 1 (Week 1): Patient to perform toilet transfer with Min assist OT Short Term Goal 2 (Week 1): Patient to perform LB dressing with Min/CGA OT Short Term Goal 3 (Week 1): Patient to perform bathing with CGA OT Short Term Goal 4 (Week 1): Patient to perform shower transfer with Min assist  Recommendations for other services: None    Skilled Therapeutic Intervention ADL ADL Eating: Set up Where Assessed-Eating: Bed level Grooming: Setup Where Assessed-Grooming: Wheelchair Upper Body Bathing: Setup Where Assessed-Upper Body Bathing: Edge of bed Lower Body Bathing: Minimal assistance Where Assessed-Lower Body Bathing: Edge of bed Upper Body Dressing: Setup Where Assessed-Upper Body Dressing: Edge of bed Lower Body Dressing: Moderate assistance Where Assessed-Lower Body Dressing: Edge of bed Toileting: Moderate assistance Where Assessed-Toileting: Bedside Commode Toilet Transfer: Moderate assistance Toilet Transfer Method: Stand pivot Toilet Transfer Equipment: Radiographer, therapeutic: Not assessed Social research officer, government: Not assessed ADL Comments: Patient with good ADL tolerance. Had increased pain with sit to stand transfers- requiring Mod assist from raised surface. Mobility  Bed Mobility Bed Mobility: Rolling Right;Rolling Left;Supine to Sit;Sit to Supine Rolling Left: Contact Guard/Touching assist Sit to Supine: Supervision/Verbal cueing Transfers Sit to Stand: Minimal Assistance - Patient > 75% Stand to Sit: Contact Guard/Touching assist   Discharge Criteria: Patient will be discharged from OT if patient refuses  treatment 3 consecutive times without medical reason, if treatment goals not met, if there is a change in medical status, if patient makes no progress towards goals or if patient is discharged from hospital.  The above assessment, treatment plan, treatment alternatives and goals were discussed and mutually agreed upon: by patient  Ebony Hail  D Cason Dabney 09/12/2022, 12:23 PM

## 2022-09-12 NOTE — Evaluation (Signed)
Physical Therapy Assessment and Plan  Patient Details  Name: Gregory [name] MRN: [id number] Date of Birth: 03/03/1961  PT Diagnosis: Abnormality of gait, Difficulty walking, Impaired cognition, Muscle weakness, and Pain in R ribs Rehab Potential: Good ELOS: 7-10   Today's Date: 09/12/2022 PT Individual Time: 1st Treatment Session: 1100-1200; 2nd Treatment Session: 1415-1530 PT Individual Time Calculation (min): 60 min; 75 min  Hospital Problem: Principal Problem:   Cardiac arrest [facility]) Active Problems:   Seizure ([place])   Past Medical History:  Past Medical History:  Diagnosis Date   AICD (automatic cardioverter/defibrillator) present 2005   CAD (coronary artery disease) 12/01/2013   Chronic combined systolic and diastolic CHF, NYHA class 1 ([place]) 12/01/2013   Erectile dysfunction 12/01/2013   HTN (hypertension) 12/01/2013   Hyperlipidemia 12/01/2013   Ischemic cardiomyopathy 12/01/2013   Presence of permanent cardiac pacemaker    Sleep apnea    Past Surgical History:  Past Surgical History:  Procedure Laterality Date   ACHILLES TENDON REPAIR     lft foot   BIOPSY  04/16/2022   Procedure: BIOPSY;  Surgeon: Yetta Flock, MD;  Location: [place];  Service: Gastroenterology;;   CARDIAC CATHETERIZATION N/A 05/05/2016   Procedure: Left Heart Cath and Coronary Angiography;  Surgeon: Leonie Man, MD;  Location: [place] [facility];  Service: Cardiovascular;  Laterality: N/A;   CARDIAC DEFIBRILLATOR PLACEMENT     CARDIOVERSION N/A 09/13/2020   Procedure: CARDIOVERSION;  Surgeon: Werner Lean, MD;  Location: [place] ENDOSCOPY;  Service: Cardiovascular;  Laterality: N/A;   CARDIOVERSION N/A 09/19/2020   Procedure: CARDIOVERSION;  Surgeon: [name], MD;  Location: [facility] ENDOSCOPY;  Service: Cardiovascular;  Laterality: N/A;   COLON RESECTION N/A 04/18/2022   Procedure: HAND ASSISTED LAPAROSCOPIC RIGHT COLON RESECTION;  Surgeon: Felicie Morn, MD;   Location: [place];  Service: General;  Laterality: N/A;   COLONOSCOPY WITH PROPOFOL N/A 04/16/2022   Procedure: COLONOSCOPY WITH PROPOFOL;  Surgeon: Yetta Flock, MD;  Location: [place];  Service: Gastroenterology;  Laterality: N/A;   EP IMPLANTABLE DEVICE N/A 12/19/2016   Procedure: ICD Generator Changeout;  Surgeon: [name], MD;  Location: [place] CV [facility];  Service: Cardiovascular;  Laterality: N/A;   ESOPHAGOGASTRODUODENOSCOPY N/A 04/16/2022   Procedure: ESOPHAGOGASTRODUODENOSCOPY (EGD);  Surgeon: Yetta Flock, MD;  Location: [facility] ENDOSCOPY;  Service: Gastroenterology;  Laterality: N/A;   IR ANGIOGRAM SELECTIVE EACH ADDITIONAL VESSEL  05/09/2022   IR ANGIOGRAM VISCERAL SELECTIVE  05/09/2022   IR CATHETER TUBE CHANGE  05/14/2022   IR CATHETER TUBE CHANGE  06/27/2022   IR EMBO ART  VEN HEMORR LYMPH EXTRAV  INC GUIDE ROADMAPPING  05/09/2022   IR IMAGE GUIDED DRAINAGE PERCUT CATH  PERITONEAL RETROPERIT  05/09/2022   IR PATIENT EVAL TECH 0-60 MINS  06/11/2022   IR RADIOLOGIST EVAL & MGMT  06/26/2022   IR RADIOLOGIST EVAL & MGMT  07/16/2022   IR US GUIDE VASC ACCESS RIGHT  05/09/2022   LEFT HEART CATH AND CORONARY ANGIOGRAPHY N/A 04/17/2022   Procedure: LEFT HEART CATH AND CORONARY ANGIOGRAPHY;  Surgeon: Belva Crome, MD;  Location: [place] CV LAB;  Service: Cardiovascular;  Laterality: N/A;   POLYPECTOMY  04/16/2022   Procedure: POLYPECTOMY;  Surgeon: Yetta Flock, MD;  Location: [facility] ENDOSCOPY;  Service: Gastroenterology;;   RIGHT/LEFT HEART CATH AND CORONARY ANGIOGRAPHY N/A 09/24/2020   Procedure: RIGHT/LEFT HEART CATH AND CORONARY ANGIOGRAPHY;  Surgeon: Lorretta Harp, MD;  Location: [place] [facility];  Service:  Cardiovascular;  Laterality: N/A;   SUBMUCOSAL TATTOO INJECTION  04/16/2022   Procedure: SUBMUCOSAL TATTOO INJECTION;  Surgeon: Yetta Flock, MD;  Location: [facility] ENDOSCOPY;  Service: Gastroenterology;;   WRIST TENODESIS      Assessment & Plan Clinical  Impression: Patient is a 61 year old male with history of HTN, VT/ICD s/p AICD, PAF: Seizure disorder, 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  He became hypoxic requiring intubation and then became pulseless requiring 1 round of CPR with epi as well as high doses of epi.  CT head question hyperdense left MCA branch but CTA was negative for LVO.  EEG showed severe diffuse encephalopathy without seizures or epileptiform discharges.  Dr. Cheral Marker question anoxic brain injury and possibly brain mets and MRI for work-up.   CTA chest negative for PE and aspiration pneumonia treated with unasyn.  He was extubated without difficulty and cardiology consulted for input on right-sided chest pain which was felt to be costochondral consistent with post CPR injury. Mexiletine as discontinued, he was loaded with amiodarone and tapered to 400 mg dialy There was no need for coronary catheterization and ICD interrogated to make sure the seizure was not due to arrhythmia.  ICD download did not show episodes of sustained or non-sustained V. Tach.  PT/OT has been working with patient who continues to be limited by chest wall pain, weakness and debility. CIR recommended due to functional decline.   Patient currently requires min with mobility secondary to muscle weakness, decreased cardiorespiratoy endurance, impaired timing and sequencing, and decreased standing balance and decreased balance strategies.  Prior to hospitalization, patient was independent  with mobility and lived with Son in a House home.  Home access is 1Stairs to enter.  Patient will benefit from skilled PT intervention to maximize safe functional mobility, minimize fall risk, and decrease caregiver burden for planned discharge home with intermittent assist.   Anticipate patient will benefit from follow up [facility] at discharge.  PT - End of Session Activity Tolerance: Tolerates 30+ min activity with multiple rests Endurance Deficit: Yes Endurance Deficit Description: Patient required frequent and extended seated rest breaks throughout treatment session secondary to fatigue/debility PT Assessment Rehab Potential (ACUTE/IP ONLY): Good PT Barriers to Discharge: Home environment access/layout;Insurance for SNF coverage PT Patient demonstrates impairments in the following area(s): Pain;Balance;Endurance;Motor PT Transfers Functional Problem(s): Bed Mobility;Bed to Chair;Car PT Locomotion Functional Problem(s): Ambulation;Stairs;Wheelchair Mobility PT Plan PT Intensity: Minimum of 1-2 x/day ,45 to 90 minutes PT Frequency: 5 out of 7 days PT Duration Estimated Length of Stay: 7-10 PT Treatment/Interventions: Ambulation/gait training;Cognitive remediation/compensation;Discharge planning;DME/adaptive equipment instruction;Functional mobility training;Pain management;Psychosocial support;Splinting/orthotics;Therapeutic Activities;UE/LE Strength taining/ROM;Visual/perceptual remediation/compensation;Balance/vestibular training;Community reintegration;Disease management/prevention;Neuromuscular re-education;Patient/family education;Stair training;Therapeutic Exercise;UE/LE Coordination activities;Wheelchair propulsion/positioning PT Transfers Anticipated Outcome(s): ModI with LRAD PT Locomotion Anticipated Outcome(s): ModI with LRAD up to 150' PT Recommendation Recommendations for Other Services: Neuropsych consult;Therapeutic Recreation consult Therapeutic Recreation Interventions: Stress management Follow Up Recommendations: Home health PT Patient destination: Home Equipment Recommended: To be determined Equipment Details: TBD   PT Evaluation Precautions/Restrictions Precautions Precautions: Fall Precaution Comments: R Rib pain Restrictions Weight  Bearing Restrictions: No Pain Pain Assessment Pain Scale: 0-10 Pain Score: 10-Worst pain ever Pain Location: Rib cage Pain Orientation: Right Pain Descriptors / Indicators: Constant Pain Onset: With Activity Patients Stated Pain Goal: 0 Pain Intervention(s): Medication (See eMAR) Multiple  Pain Sites: No Pain Interference Pain Interference Pain Effect on Sleep: 4. Almost constantly Pain Interference with Therapy Activities: 2. Occasionally Pain Interference with Day-to-Day Activities: 3. Frequently Home Living/Prior Functioning Home Living Living Arrangements: Children Available Help at Discharge: Family;Available 24 hours/day Type of Home: House Home Access: Stairs to enter Home Layout: Two level;Bed/bath upstairs Alternate Level Stairs-Number of Steps: 17 Alternate Level Stairs-Rails: Can reach both;Left;Right Bathroom Shower/Tub: Chiropodist: Handicapped height Bathroom Accessibility: Yes Additional Comments: Patient with prior rehabilitation stay  Lives With: Son Prior Function Level of Independence: Independent with basic ADLs;Independent with homemaking with ambulation;Independent with transfers;Independent with gait Vocation: Retired Vision/Perception  Vision - History Ability to See in Adequate Light: 0 Adequate Vision - Assessment Ocular Range of Motion: Within Functional Limits Tracking/Visual Pursuits: Able to track stimulus in all quads without difficulty  Cognition Overall Cognitive Status: Within Functional Limits for tasks assessed Arousal/Alertness: Awake/alert Orientation Level: Oriented X4 Year: 2023 Month: October Day of Week: Correct Sustained Attention: Appears intact Memory: Appears intact Memory Impairment: Decreased short term memory Awareness: Appears intact Problem Solving: Appears intact Problem Solving Impairment: Verbal complex Safety/Judgment: Appears intact Sensation Sensation Light Touch: Appears  Intact Hot/Cold: Appears Intact Proprioception: Appears Intact Stereognosis: Appears Intact Additional Comments: Patient reports hand writing is as bad as always. No gross sensory impairments noted. Coordination Gross Motor Movements are Fluid and Coordinated: Yes Fine Motor Movements are Fluid and Coordinated: Yes Finger Nose Finger Test: Over and undershooting bilaterally, however not significant Motor  Motor Motor: Within Functional Limits Motor - Skilled Clinical Observations: Strength assessment limited secondary to R rib pain- Patient seems to guard with functional mobility in order to decrease pain.  Trunk/Postural Assessment  Cervical Assessment Cervical Assessment: Within Functional Limits Thoracic Assessment Thoracic Assessment: Within Functional Limits Lumbar Assessment Lumbar Assessment: Within Functional Limits Postural Control Postural Control: Deficits on evaluation (Delayed secondary to pain)  Balance Balance Balance Assessed: Yes Standardized Balance Assessment Standardized Balance Assessment: Berg Balance Test Berg Balance Test Sit to Stand: Able to stand using hands after several tries Standing Unsupported: Able to stand 2 minutes with supervision Sitting with Back Unsupported but Feet Supported on Floor or Stool: Able to sit safely and securely 2 minutes Stand to Sit: Controls descent by using hands Transfers: Able to transfer with verbal cueing and /or supervision Standing Unsupported with Eyes Closed: Able to stand 3 seconds Standing Ubsupported with Feet Together: Needs help to attain position but able to stand for 30 seconds with feet together From Standing, Reach Forward with Outstretched Arm: Can reach forward >5 cm safely (2") From Standing Position, Pick up Object from Floor: Unable to try/needs assist to keep balance From Standing Position, Turn to Look Behind Over each Shoulder: Needs supervision when turning Turn 360 Degrees: Needs close  supervision or verbal cueing Standing Unsupported, Alternately Place Feet on Step/Stool: Able to complete >2 steps/needs minimal assist Standing Unsupported, One Foot in Front: Able to take small step independently and hold 30 seconds Standing on One Leg: Unable to try or needs assist to prevent fall Total Score: 24 Static Sitting Balance Static Sitting - Balance Support: Feet supported;Bilateral upper extremity supported Static Sitting - Level of Assistance: 6: Modified independent (Device/Increase time) Dynamic Sitting Balance Dynamic Sitting - Balance Support: Feet supported;During functional activity Dynamic Sitting - Level of Assistance: 5: Stand by assistance Static Standing Balance Static Standing - Balance Support: During functional activity;No upper extremity supported Static Standing - Level of Assistance:  (CGA) Dynamic Standing Balance  Dynamic Standing - Balance Support: During functional activity;No upper extremity supported Dynamic Standing - Level of Assistance: 4: Min assist Extremity Assessment  RUE Assessment RUE Assessment: Within Functional Limits Passive Range of Motion (PROM) Comments: Patient with functional PROM Active Range of Motion (AROM) Comments: Shoulder flexion actively limited to 0-85 with the LUE, 0-110 RUE Patient does not report any prior injury or this being his baseline LUE Assessment LUE Assessment: Within Functional Limits RLE Assessment RLE Assessment: Exceptions to [facility] General Strength Comments: Strength assessed with functional mobility- R LE grossly 4/5, however limited at times secondary to R rib pain with guarding noted. LLE Assessment LLE Assessment: Exceptions to [facility] General Strength Comments: Strength assessed with functional mobility- R LE grossly 4/5, however limited at times secondary to R rib pain with guarding noted.  Care Tool Care Tool Bed Mobility Roll left and right activity   Roll left and right assist level: Contact  Guard/Touching assist    Sit to lying activity   Sit to lying assist level: Supervision/Verbal cueing    Lying to sitting on side of bed activity   Lying to sitting on side of bed assist level: the ability to move from lying on the back to sitting on the side of the bed with no back support.: Supervision/Verbal cueing     Care Tool Transfers Sit to stand transfer   Sit to stand assist level: Minimal Assistance - Patient > 75%    Chair/bed transfer   Chair/bed transfer assist level: Contact Guard/Touching assist     Toilet transfer   Assist Level: Moderate Assistance - Patient 50 - 74%    Car transfer   Car transfer assist level: Minimal Assistance - Patient > 75%      Care Tool Locomotion Ambulation   Assist level: Minimal Assistance - Patient > 75% Assistive device: No Device Max distance: 150'  Walk 10 feet activity   Assist level: Contact Guard/Touching assist Assistive device: No Device   Walk 50 feet with 2 turns activity   Assist level: Minimal Assistance - Patient > 75% Assistive device: No Device  Walk 150 feet activity   Assist level: Minimal Assistance - Patient > 75% Assistive device: No Device  Walk 10 feet on uneven surfaces activity   Assist level: Minimal Assistance - Patient > 75% Assistive device: Other (comment) (No AD)  Stairs   Assist level: Contact Guard/Touching assist Stairs assistive device: 2 hand rails Max number of stairs: 16  Walk up/down 1 step activity   Walk up/down 1 step (curb) assist level: Contact Guard/Touching assist Walk up/down 1 step or curb assistive device: 2 hand rails  Walk up/down 4 steps activity   Walk up/down 4 steps assist level: Contact Guard/Touching assist Walk up/down 4 steps assistive device: 2 hand rails  Walk up/down 12 steps activity   Walk up/down 12 steps assist level: Contact Guard/Touching assist Walk up/down 12 steps assistive device: 2 hand rails  Pick up small objects from floor   Pick up small  object from the floor assist level: Minimal Assistance - Patient > 75% Pick up small object from the floor assistive device: No AD  Wheelchair Is the patient using a wheelchair?: Yes Type of Wheelchair: Manual   Wheelchair assist level: Supervision/Verbal cueing Max wheelchair distance: 150'  Wheel 50 feet with 2 turns activity   Assist Level: Supervision/Verbal cueing  Wheel 150 feet activity   Assist Level: Supervision/Verbal cueing    Refer to Care Plan for Long Term Goals  SHORT TERM GOAL WEEK 1 PT Short Term Goal 1 (Week 1): STG=LTG secondary to ELOS  Recommendations for other services: Therapeutic Recreation  Stress management  Skilled Therapeutic Intervention Mobility Bed Mobility Bed Mobility: Rolling Right;Rolling Left;Supine to Sit;Sit to Supine Rolling Left: Contact Guard/Touching assist Sit to Supine: Supervision/Verbal cueing Transfers Transfers: Sit to Stand;Stand to Sit;Stand Pivot Transfers Sit to Stand: Minimal Assistance - Patient > 75% Stand to Sit: Contact Guard/Touching assist Stand Pivot Transfers: Contact Guard/Touching assist Transfer (Assistive device): None Locomotion  Gait Ambulation: Yes Gait Assistance: Contact Guard/Touching assist Gait Distance (Feet): 150 Feet Assistive device: None Gait Gait: Yes Gait Pattern: Decreased stride length;Antalgic Gait velocity: decreased Stairs / Additional Locomotion Stairs: Yes Stairs Assistance: Contact Guard/Touching assist Stair Management Technique: Two rails Number of Stairs: 16 Height of Stairs: 6 Ramp: Minimal Assistance - Patient >75% Curb: Nurse, mental health Mobility: Yes Wheelchair Assistance: Chartered loss adjuster: Both upper extremities Wheelchair Parts Management: Supervision/cueing Distance: 150  Skilled Interventions- 1st Treatment Session:  Patient greeted sitting upright in wheelchair in room and agreeable to  PT treatment session. Patient required CGA/MinA with all functional mobility without the use of an assistive device. Patient with impaired endurance/activity tolerance requiring extended seated rest breaks throughout treatment session. Patient demonstrated a shuffling gait pattern with wide BOS and decreased cadence. Patient returned to his room supine in bed with NT present, bed alarm on, call bell within reach and all needs met.     2nd Treatment Session:  Patient greeted supine in bed and agreeable to PT treatment session. Patient transitioned from semi-reclined to sitting EOB without bed rail and supv for safety. Patient stood without assistive device and MinA for initiating standing. While standing RN checked wound on bottom and applied dressing for protection- Patient stood ~3 minutes with CGA.  Patient performed sit/stand from EOB without UE support and MinA for initiating standing- Patient then ambulated ~10' to his wheelchair with CGA and decreased cadence with shuffling gait pattern.   Patient ascended/descended x16 steps with B HR and CGA for safety- Notable increased UE use throughout stair mobility and required an extended seated rest break secondary to fatigue.   Patient gait trained x90' without AD and CGA for safety- Patient demonstrated shuffling gait pattern with downward gaze and decreased cadence. Patient required a seated rest break after gait trial secondary to notable fatigue and poor endurance/activity tolerance with B LE shaking due to poor muscle tolerance.    Assessed patient's balance with Merrilee Jansky Balance Assessment- Please see below for further details. Patient demonstrates increased fall risk as noted by score of 24/56 on Berg Balance Scale. (<36= high risk for falls, close to 100%; 37-45 significant >80%; 46-51 moderate >50%; 52-55 lower >25%).   Patient returned to his room supine in bed with call bell within reach, bed alarm on and all needs met.     Discharge  Criteria: Patient will be discharged from PT if patient refuses treatment 3 consecutive times without medical reason, if treatment goals not met, if there is a change in medical status, if patient makes no progress towards goals or if patient is discharged from hospital.  The above assessment, treatment plan, treatment alternatives and goals were discussed and mutually agreed upon: by patient  [place] 09/12/2022, 3:22 PM

## 2022-09-12 NOTE — Progress Notes (Signed)
[facility] Individual Statement of Services  Patient Name:  Gregory [name]  Date:  09/12/2022  Welcome to the [facility].  Our goal is to provide you with an individualized program based on your diagnosis and situation, designed to meet your specific needs.  With this comprehensive rehabilitation program, you will be expected to participate in at least 3 hours of rehabilitation therapies Monday-Friday, with modified therapy programming on the weekends.  Your rehabilitation program will include the following services:  Physical Therapy (PT), Occupational Therapy (OT), 24 hour per day rehabilitation nursing, Therapeutic Recreaction (TR), Care Coordinator, Rehabilitation Medicine, Nutrition Services, and Pharmacy Services  Weekly team conferences will be held on Wednesday to discuss your progress.  Your Inpatient Rehabilitation Care Coordinator will talk with you frequently to get your input and to update you on team discussions.  Team conferences with you and your family in attendance may also be held.  Expected length of stay: 9-12 days  Overall anticipated outcome: supervision-mod/I level  Depending on your progress and recovery, your program may change. Your Inpatient Rehabilitation Care Coordinator will coordinate services and will keep you informed of any changes. Your Inpatient Rehabilitation Care Coordinator's name and contact numbers are listed  below.  The following services may also be recommended but are not provided by the [facility]:   [place] will be made to provide these services after discharge if needed.  Arrangements include referral to agencies that provide these services.  Your insurance has been verified to be:  Workers Education administrator doctor is: Film/video editor is PCP refuses to get one  Pertinent information  will be shared with your doctor and your insurance company.  Inpatient Rehabilitation Care Coordinator:  Ovidio Kin, [place] or Emilia Beck  Information discussed with and copy given to patient by: Elease Hashimoto, 09/12/2022, 1:58 PM

## 2022-09-12 NOTE — Plan of Care (Signed)
  Problem: Sit to Stand Goal: LTG:  Patient will perform sit to stand with assistance level (PT) Description: LTG:  Patient will perform sit to stand with assistance level (PT) Flowsheets (Taken 09/12/2022 1540) LTG: PT will perform sit to stand in preparation for functional mobility with assistance level: Independent with assistive device   Problem: RH Bed Mobility Goal: LTG Patient will perform bed mobility with assist (PT) Description: LTG: Patient will perform bed mobility with assistance, with/without cues (PT). Flowsheets (Taken 09/12/2022 1540) LTG: Pt will perform bed mobility with assistance level of: Independent with assistive device    Problem: RH Bed to Chair Transfers Goal: LTG Patient will perform bed/chair transfers w/assist (PT) Description: LTG: Patient will perform bed to chair transfers with assistance (PT). Flowsheets (Taken 09/12/2022 1540) LTG: Pt will perform Bed to Chair Transfers with assistance level: Independent with assistive device    Problem: RH Car Transfers Goal: LTG Patient will perform car transfers with assist (PT) Description: LTG: Patient will perform car transfers with assistance (PT). Flowsheets (Taken 09/12/2022 1540) LTG: Pt will perform car transfers with assist:: Independent with assistive device    Problem: RH Ambulation Goal: LTG Patient will ambulate in home environment (PT) Description: LTG: Patient will ambulate in home environment, # of feet with assistance (PT). Flowsheets (Taken 09/12/2022 1540) LTG: Pt will ambulate in home environ  assist needed:: Independent with assistive device LTG: Ambulation distance in home environment: 50' Goal: LTG Patient will ambulate in community environment (PT) Description: LTG: Patient will ambulate in community environment, # of feet with assistance (PT). Flowsheets (Taken 09/12/2022 1540) LTG: Pt will ambulate in community environ  assist needed:: Independent with assistive device LTG: Ambulation  distance in community environment: 150'   Problem: RH Wheelchair Mobility Goal: LTG Patient will propel w/c in community environment (PT) Description: LTG: Patient will propel wheelchair in community environment, # of feet with assist (PT) Flowsheets (Taken 09/12/2022 1540) LTG: Pt will propel w/c in community environ  assist needed:: Independent with assistive device Distance: wheelchair distance in controlled environment: 150   Problem: RH Stairs Goal: LTG Patient will ambulate up and down stairs w/assist (PT) Description: LTG: Patient will ambulate up and down # of stairs with assistance (PT) Flowsheets (Taken 09/12/2022 1540) LTG: Pt will ambulate up/down stairs assist needed:: Independent with assistive device LTG: Pt will  ambulate up and down number of stairs: 16

## 2022-09-13 DIAGNOSIS — I469 Cardiac arrest, cause unspecified: Secondary | ICD-10-CM | POA: Diagnosis not present

## 2022-09-13 LAB — GLUCOSE, CAPILLARY
Glucose-Capillary: 159 mg/dL — ABNORMAL HIGH (ref 70–99)
Glucose-Capillary: 175 mg/dL — ABNORMAL HIGH (ref 70–99)
Glucose-Capillary: 105 mg/dL — ABNORMAL HIGH (ref 70–99)
Glucose-Capillary: 212 mg/dL — ABNORMAL HIGH (ref 70–99)

## 2022-09-13 NOTE — Progress Notes (Signed)
+/-   sleep. PRN oxy ir '10mg'$ 's given at 2118 for complaint of right rib cage pain and given again at 0606. Rates pain a 8. PRN xanax and PRN ambien given at 2118 for anxiety and sleep. Patrici Ranks A

## 2022-09-13 NOTE — Progress Notes (Signed)
PROGRESS NOTE   Subjective/Complaints: Patient reports much improved sleep with combination of xanax, ambien, and oxycodone. No endorsed side effects, no acute complaints. No events overnight.    ROS: Denies fevers, chills, N/V, abdominal pain, constipation, diarrhea, SOB, cough, chest pain, new weakness or paraesthesias.    Objective:   No results found. Recent Labs    09/12/22 0536  WBC 12.7*  HGB 11.7*  HCT 35.3*  PLT 309    Recent Labs    09/12/22 0536  NA 137  K 4.4  CL 107  CO2 27  GLUCOSE 108*  BUN 12  CREATININE 0.83  CALCIUM 8.2*     Intake/Output Summary (Last 24 hours) at 09/13/2022 2046 Last data filed at 09/13/2022 1730 Gross per 24 hour  Intake 957 ml  Output 1800 ml  Net -843 ml      Pressure Injury 08/30/22 Sacrum Mid Stage 1 -  Intact skin with non-blanchable redness of a localized area usually over a bony prominence. (Active)  08/30/22 1730  Location: Sacrum  Location Orientation: Mid  Staging: Stage 1 -  Intact skin with non-blanchable redness of a localized area usually over a bony prominence.  Wound Description (Comments):   Present on Admission: Yes    Physical Exam: Vital Signs Blood pressure 102/72, pulse 64, temperature 98.4 F (36.9 C), resp. rate 15, weight 101 kg, SpO2 94 %.   General: Alert and oriented x 3, No apparent distress HEENT: Head is normocephalic, atraumatic, conjugate gaze, MMM, Fatty deposits outer cantus bilaterally- chronic   Neck: Supple without JVD or lymphadenopathy Heart: Reg rate and rhythm. No murmurs rubs or gallops  Chest: CTA bilaterally without wheezes, rales, or rhonchi; no distress Abdomen: Soft, nontender, non-distended, bowel sounds positive. Extremities: No clubbing, cyanosis, or edema. Pulses are 2+  Musculoskeletal:        Comments:antigravity all 4 extremities Skin: C/D/I.  Neurological:     General: No focal deficit present.      Mental Status: He is alert and oriented to person, place, and time.  Psychiatric:        Mood and Affect: Mood normal.        Behavior: Behavior normal.    Assessment/Plan: 1. Functional deficits which require 3+ hours per day of interdisciplinary therapy in a comprehensive inpatient rehab setting. Physiatrist is providing close team supervision and 24 hour management of active medical problems listed below. Physiatrist and rehab team continue to assess barriers to discharge/monitor patient progress toward functional and medical goals  Care Tool:  Bathing    Body parts bathed by patient: Right arm, Left arm, Chest, Abdomen, Front perineal area, Buttocks, Face, Right upper leg, Left upper leg   Body parts bathed by helper: Left lower leg, Right lower leg     Bathing assist Assist Level: Minimal Assistance - Patient > 75%     Upper Body Dressing/Undressing Upper body dressing   What is the patient wearing?: Pull over shirt    Upper body assist Assist Level: Set up assist    Lower Body Dressing/Undressing Lower body dressing      What is the patient wearing?: Pants     Lower body assist  Assist for lower body dressing: Moderate Assistance - Patient 50 - 74%     Toileting Toileting    Toileting assist Assist for toileting: Moderate Assistance - Patient 50 - 74%     Transfers Chair/bed transfer  Transfers assist     Chair/bed transfer assist level: Contact Guard/Touching assist     Locomotion Ambulation   Ambulation assist      Assist level: Minimal Assistance - Patient > 75% Assistive device: No Device Max distance: 150'   Walk 10 feet activity   Assist     Assist level: Contact Guard/Touching assist Assistive device: No Device   Walk 50 feet activity   Assist    Assist level: Minimal Assistance - Patient > 75% Assistive device: No Device    Walk 150 feet activity   Assist    Assist level: Minimal Assistance - Patient >  75% Assistive device: No Device    Walk 10 feet on uneven surface  activity   Assist     Assist level: Minimal Assistance - Patient > 75% Assistive device: Other (comment) (No AD)   Wheelchair     Assist Is the patient using a wheelchair?: Yes Type of Wheelchair: Manual    Wheelchair assist level: Supervision/Verbal cueing Max wheelchair distance: 150'    Wheelchair 50 feet with 2 turns activity    Assist        Assist Level: Supervision/Verbal cueing   Wheelchair 150 feet activity     Assist      Assist Level: Supervision/Verbal cueing   Blood pressure 102/72, pulse 64, temperature 98.4 F (36.9 C), resp. rate 15, weight 101 kg, SpO2 94 %.  Medical Problem List and Plan: 1. Functional deficits secondary to PEA arrest/debility             -patient may  shower             -ELOS/Goals: 10-14 days- supervision to min A  -Continue CIR 2.  Antithrombotics: -DVT/anticoagulation:  Pharmaceutical: Other (comment) Eliquis.              -antiplatelet therapy: Plavix.  3. Pain Management: Oxycodone prn.  4. Mood/Behavior/Sleep: LCSW to follow for evaluation and support.              --Ambien helps him stay asleep longer (better than trazodone)             -antipsychotic agents: N/A  -Xanax PRN 0.25 BID started for anxiety, appears to be home medication 5. Neuropsych/cognition: This patient is capable of making decisions on his own behalf. 6. Skin/Wound Care: Routine pressure relief measures.  7. Fluids/Electrolytes/Nutrition: Monitor I/O. Check CMET in am.  8. H/o new seizures: Continue Keppra 1500 mg BID 9. CAD/ICM/chronic combined CHF: Monitor for signs of overload. Daily weights --Continue Lipitor, Farxiga, Lasix, Entresto, atorvastatin and Plavix.   Filed Weights   09/11/22 1553 09/13/22 0500  Weight: 93 kg 101 kg               - no s/s fluid overload, likely unreliable weights; monitor  10. V tach s/p AICD: On amiodarone and Metoprolol. Me 11. A  fib: Monitor HR TID. On eliquis, amiodarone and metoprolol 12.  Rib fractures: Encourage pulmonary hygeine.   -Continue 3x lidocaine patches 13. Colon Ca w/mets: Xeloda to resume after discharge.  35. DM type 2  -Continue farxiga, SSI            - 9/30 well controlled  Latest Reference Range & Units 09/12/22  20:37 09/13/22 06:23 09/13/22 11:23 09/13/22 16:31  Glucose-Capillary 70 - 99 mg/dL 139 (H) 159 (H) 175 (H) 105 (H)  (H): Data is abnormally high  LOS: 2 days A FACE TO FACE EVALUATION WAS PERFORMED  Gertie Gowda 09/13/2022, 8:46 PM

## 2022-09-14 LAB — GLUCOSE, CAPILLARY
Glucose-Capillary: 135 mg/dL — ABNORMAL HIGH (ref 70–99)
Glucose-Capillary: 111 mg/dL — ABNORMAL HIGH (ref 70–99)
Glucose-Capillary: 116 mg/dL — ABNORMAL HIGH (ref 70–99)
Glucose-Capillary: 195 mg/dL — ABNORMAL HIGH (ref 70–99)

## 2022-09-14 NOTE — Progress Notes (Signed)
Occupational Therapy Session Note  Patient Details  Name: Gregory [name] MRN: [id number] Date of Birth: Aug 17, 1961  Today's Date: 09/14/2022 OT Individual Time: 2426-8341 OT Individual Time Calculation (min): 75 min    Short Term Goals: Week 1:  OT Short Term Goal 1 (Week 1): Patient to perform toilet transfer with Min assist OT Short Term Goal 2 (Week 1): Patient to perform LB dressing with Min/CGA OT Short Term Goal 3 (Week 1): Patient to perform bathing with CGA OT Short Term Goal 4 (Week 1): Patient to perform shower transfer with Min assist  Skilled Therapeutic Interventions/Progress Updates:    Pt received supine with 8/10 rib pain, agreeable to OT session. Shower used as pain intervention. He completed bed mobility to EOB with (S). Stand pivot transfer to the [facility] with CGA. Several minutes to attempt and void BM. Discussion home set up and accessibility while he used bathroom. He was unable to void. He completed functional mobility into the bathroom with Min A without an AD. He transferred into walk in shower with min A using TTB. He completed UB bathing seated with (S) and was able to void BM with warm water stimulation. He required min A to stand from lower TTB. He completed peri bathing in standing with min A for balance. He required extra time to wash thoroughly following BM, both in standing and sitting. Significantly decreased activity tolerance. He completed functional mobility back to his w/c with min A without an AD. Oral care with set up assist. UB dressing with (S), LB with min A sit <> stand. Pt left supine with all needs met, bed alarm set.    Therapy Documentation Precautions:  Precautions Precautions: Fall Precaution Comments: R Rib pain Restrictions Weight Bearing Restrictions: No   Therapy/Group: Individual Therapy  Curtis Sites 09/14/2022, 7:15 AM

## 2022-09-14 NOTE — Progress Notes (Signed)
Physical Therapy Session Note  Patient Details  Name: Keyon Liller MRN: [id number] Date of Birth: [birth year]  Today's Date: 09/14/2022 PT Individual Time: 9390-3009 PT Individual Time Calculation (min): 61 min & 58 min    Short Term Goals: Week 1:  PT Short Term Goal 1 (Week 1): STG=LTG secondary to ELOS  Skilled Therapeutic Interventions/Progress Updates:      Therapy Documentation Precautions:  Precautions Precautions: Fall Precaution Comments: R Rib pain Restrictions Weight Bearing Restrictions: No   Treatment Session 1:  Pt received semi-reclined in bed, agreeable to PT session with emphasis on gait and balance/coordiantion training. Pt reports 7/10 rib pain, nursing provided pt with medications mid session. Pt requires min A for rolling  and supine to sit with verbal cues for sequencing from flat bed. Pt SBA with sit to stand and gait ~150 ft to dayroom. Pt presents with shuffle gait pattern and decreased step length. Pt navigated agility ladder with forward walking to address decreased step length and foot clearance and pt requested to stop due to left knee weakness/instabilty. Pt transitioned to squats 3 x 10 to engage quad activation. Pt performed 2 x 8 of 3 inch step taps for LE strength. Pt ambulated to room and left semi-reclined in bed with alarm on and all needs within reach.    Treatment Session 2:  Pt received semi-reclined in bed, agreeable to PT session. Pt reports rib pain and was pre-medicated. Pt (S) with supine to sit with HOB raised and with sit to stand and ambulation ~200 ft no AD. Pt reports lightheadedness and BP assessed in w/c and was 115/80 (91) and pulse was 74. Pt reported improvement in symptoms with seated rest break and transported remaining distance outside [facility] atrium for community integration. Pt navigated ambulation on level and unlevel terrain and ambulated ~150 ft initially SBA and progressed to max A for recovery following left knee  buckle and loss of balance. Pt transported by w/c to room and pt left semi-reclined in bed. Pt is interested in obtaining knee brace, PT educated pt on benefits of quad strengthening to address knee instability with gait and provided pt with bed level exercises to perform outside of therapy. Pt left with alarm on and all needs within reach.    Therapy/Group: Individual Therapy  Verl Dicker Verl Dicker PT, DPT  09/14/2022, 7:50 AM

## 2022-09-14 NOTE — IPOC Note (Signed)
Overall Plan of Care [facility]) Patient Details Name: Gregory [name] MRN: [id number] DOB: 1961-05-17  Admitting Diagnosis: Cardiac arrest [facility])  Hospital Problems: Principal Problem:   Cardiac arrest [facility]) Active Problems:   Seizure ([place])     Functional Problem List: Nursing Bowel, Medication Management, Safety, Pain, Skin Integrity, Endurance  PT Pain, Balance, Endurance, Motor  OT Endurance, Motor, Balance, Pain  SLP    TR         Basic ADL's: OT Bathing, Dressing, Toileting     Advanced  ADL's: OT Simple Meal Preparation     Transfers: PT Bed Mobility, Bed to Chair, Car  OT Toilet, Tub/Shower     Locomotion: PT Ambulation, Stairs, Wheelchair Mobility     Additional Impairments: OT Fuctional Use of Upper Extremity  SLP        TR      Anticipated Outcomes Item Anticipated Outcome  Self Feeding Independent  Swallowing      Basic self-care  SBA/ Set-up  Toileting  SBA/SPV   Bathroom Transfers SBA/SPV  Bowel/Bladder  manage bowel w mod I  Transfers  ModI with LRAD  Locomotion  ModI with LRAD up to 150'  Communication     Cognition     Pain  < 4 with prns  Safety/Judgment  manage w cues   Therapy Plan: PT Intensity: Minimum of 1-2 x/day ,45 to 90 minutes PT Frequency: 5 out of 7 days PT Duration Estimated Length of Stay: 7-10 OT Intensity: Minimum of 1-2 x/day, 45 to 90 minutes OT Frequency: 5 out of 7 days OT Duration/Estimated Length of Stay: 10-14 days     Team Interventions: Nursing Interventions Disease Management/Prevention, Medication Management, Discharge Planning, Skin Care/Wound Management, Pain Management, Bowel Management, Patient/Family Education  PT interventions Ambulation/gait training, Cognitive remediation/compensation, Discharge planning, DME/adaptive equipment instruction, Functional mobility training, Pain management, Psychosocial support, Splinting/orthotics, Therapeutic Activities, UE/LE Strength taining/ROM,  Visual/perceptual remediation/compensation, Training and development officer, Community reintegration, Disease management/prevention, Neuromuscular re-education, Barrister's clerk education, IT trainer, Therapeutic Exercise, UE/LE Coordination activities, Wheelchair propulsion/positioning  OT Interventions Community reintegration, Discharge planning, DME/adaptive equipment instruction, Functional mobility training, Pain management, Therapeutic Activities, Self Care/advanced ADL retraining, Patient/family education, UE/LE Coordination activities, Therapeutic Exercise, UE/LE Strength taining/ROM  SLP Interventions    TR Interventions    SW/CM Interventions Discharge Planning, Psychosocial Support, Patient/Family Education   Barriers to Discharge MD  Medical stability, Home enviroment access/loayout, Incontinence, Wound care, Lack of/limited family support, Insurance for SNF coverage, Medication compliance, and pain management.  Nursing Decreased caregiver support, Home environment access/layout 2 level , level entry bil rail with 1/2 ba on main, B+B up 14 steps w dtr/son  PT Home environment access/layout, Insurance for SNF coverage    [place] for SNF coverage     Team Discharge Planning: Destination: PT-Home ,OT- Home , SLP-  Projected Follow-up: PT-Home health PT, OT-  Outpatient OT, SLP-  Projected Equipment Needs: PT-To be determined, OT- Rolling walker with 5" wheels, Tub/shower bench, To be determined, SLP-  Equipment Details: PT-TBD, OT-  Patient/family involved in discharge planning: PT- Patient,  OT-Patient, SLP-   MD ELOS: 10-14 days Medical Rehab Prognosis:  Excellent Assessment: The patient has been admitted for CIR therapies with the diagnosis of debility due to PEA arrest. The team will be addressing functional mobility, strength, stamina, balance, safety, adaptive techniques and equipment, self-care, bowel and bladder mgt, patient and caregiver education,  and pain management. Goals have been set  at [facility]. Anticipated discharge destination is home.       See Team Conference Notes for weekly updates to the plan of care     Gertie Gowda, DO 09/14/2022

## 2022-09-14 NOTE — Progress Notes (Signed)
Restless night. PRN xanax, oxy ir and Lorrin Mais given together per patients request at 2140. Patient without specific complaint of. Continent of B & B, LBM 09/30. Gregory [name]

## 2022-09-15 DIAGNOSIS — D649 Anemia, unspecified: Secondary | ICD-10-CM

## 2022-09-15 DIAGNOSIS — I469 Cardiac arrest, cause unspecified: Secondary | ICD-10-CM | POA: Diagnosis not present

## 2022-09-15 DIAGNOSIS — F419 Anxiety disorder, unspecified: Secondary | ICD-10-CM | POA: Diagnosis not present

## 2022-09-15 DIAGNOSIS — D72829 Elevated white blood cell count, unspecified: Secondary | ICD-10-CM

## 2022-09-15 DIAGNOSIS — E1165 Type 2 diabetes mellitus with hyperglycemia: Secondary | ICD-10-CM | POA: Diagnosis not present

## 2022-09-15 LAB — BASIC METABOLIC PANEL
Anion gap: 8 (ref 5–15)
BUN: 14 mg/dL (ref 8–23)
CO2: 24 mmol/L (ref 22–32)
Calcium: 8.1 mg/dL — ABNORMAL LOW (ref 8.9–10.3)
Chloride: 105 mmol/L (ref 98–111)
Creatinine, Ser: 0.86 mg/dL (ref 0.61–1.24)
GFR, Estimated: >60 mL/min (ref >60)
Glucose, Bld: 194 mg/dL — ABNORMAL HIGH (ref 70–99)
Potassium: 4.2 mmol/L (ref 3.5–5.1)
Sodium: 137 mmol/L (ref 135–145)

## 2022-09-15 LAB — CBC
HCT: 33.8 % — ABNORMAL LOW (ref 39.0–52.0)
Hemoglobin: 11.1 g/dL — ABNORMAL LOW (ref 13.0–17.0)
MCH: 32.3 pg (ref 26.0–34.0)
MCHC: 32.8 g/dL (ref 30.0–36.0)
MCV: 98.3 fL (ref 80.0–100.0)
Platelets: 333 10*3/uL (ref 150–400)
RBC: 3.44 MIL/uL — ABNORMAL LOW (ref 4.22–5.81)
RDW: 22.6 % — ABNORMAL HIGH (ref 11.5–15.5)
WBC: 10.8 10*3/uL — ABNORMAL HIGH (ref 4.0–10.5)
nRBC: 0 % (ref 0.0–0.2)

## 2022-09-15 LAB — GLUCOSE, CAPILLARY
Glucose-Capillary: 194 mg/dL — ABNORMAL HIGH (ref 70–99)
Glucose-Capillary: 144 mg/dL — ABNORMAL HIGH (ref 70–99)
Glucose-Capillary: 81 mg/dL (ref 70–99)
Glucose-Capillary: 130 mg/dL — ABNORMAL HIGH (ref 70–99)

## 2022-09-15 MED ORDER — ALPRAZOLAM 0.25 MG PO TABS
0.2500 mg | ORAL_TABLET | Freq: Three times a day (TID) | ORAL | Status: DC | PRN
  Administered 2022-09-15 – 2022-09-17 (×5): 0.25 mg via ORAL
  Filled 2022-09-15 (×6): qty 1

## 2022-09-15 NOTE — Progress Notes (Signed)
Restless night. PRN tylenol, ambien and oxy ir given at 2133. Continues to complain of right rib cage pain. Rates pain an 8, reports pain getting better. PRN oxy ir given at 0249 and PRN tylenol given at 0507. Gregory [name] A

## 2022-09-15 NOTE — Progress Notes (Signed)
PROGRESS NOTE   Subjective/Complaints: Pt working in gym with therapy this AM. Reports some issues with anxiety, chronically takes xanax at home, usually in the afternoon and HS.    ROS: Denies fevers, chills, HA, N/V/D/C, abdominal pain,SOB, cough, chest pain, new weakness or paraesthesias.    Objective:   No results found. Recent Labs    09/15/22 0608  WBC 10.8*  HGB 11.1*  HCT 33.8*  PLT 333    Recent Labs    09/15/22 0608  NA 137  K 4.2  CL 105  CO2 24  GLUCOSE 194*  BUN 14  CREATININE 0.86  CALCIUM 8.1*     Intake/Output Summary (Last 24 hours) at 09/15/2022 0805 Last data filed at 09/15/2022 0700 Gross per 24 hour  Intake 838 ml  Output 2400 ml  Net -1562 ml      Pressure Injury 08/30/22 Sacrum Mid Stage 1 -  Intact skin with non-blanchable redness of a localized area usually over a bony prominence. (Active)  08/30/22 1730  Location: Sacrum  Location Orientation: Mid  Staging: Stage 1 -  Intact skin with non-blanchable redness of a localized area usually over a bony prominence.  Wound Description (Comments):   Present on Admission: Yes    Physical Exam: Vital Signs Blood pressure 111/79, pulse 63, temperature 98.2 F (36.8 C), resp. rate 16, weight 93 kg, SpO2 98 %.   General: Alert and oriented x 4, No apparent distress HEENT: Head is normocephalic, atraumatic, conjugate gaze, MMM, Fatty deposits outer cantus bilaterally- chronic   Neck: Supple without JVD or lymphadenopathy Heart: Reg rate and rhythm. No murmurs rubs or gallops  Chest: CTA bilaterally without wheezes, rales, or rhonchi; no distress Abdomen: Soft, nontender, non-distended, bowel sounds positive. Extremities: No clubbing, cyanosis, or edema. Pulses are 2+  Musculoskeletal:        Comments:antigravity all 4 extremities Skin: C/D/I.  Neurological:     General: No focal deficit present.     Mental Status: He is alert and  oriented to person, place, and time.  Psychiatric:        Mood and Affect: A little anxious     Behavior: Behavior normal.    Assessment/Plan: 1. Functional deficits which require 3+ hours per day of interdisciplinary therapy in a comprehensive inpatient rehab setting. Physiatrist is providing close team supervision and 24 hour management of active medical problems listed below. Physiatrist and rehab team continue to assess barriers to discharge/monitor patient progress toward functional and medical goals  Care Tool:  Bathing    Body parts bathed by patient: Right arm, Left arm, Chest, Abdomen, Front perineal area, Buttocks, Face, Right upper leg, Left upper leg, Left lower leg, Right lower leg   Body parts bathed by helper: Left lower leg, Right lower leg     Bathing assist Assist Level: Minimal Assistance - Patient > 75%     Upper Body Dressing/Undressing Upper body dressing   What is the patient wearing?: Pull over shirt    Upper body assist Assist Level: Set up assist    Lower Body Dressing/Undressing Lower body dressing      What is the patient wearing?: Pants  Lower body assist Assist for lower body dressing: Minimal Assistance - Patient > 75%     Toileting Toileting    Toileting assist Assist for toileting: Minimal Assistance - Patient > 75%     Transfers Chair/bed transfer  Transfers assist     Chair/bed transfer assist level: Minimal Assistance - Patient > 75%     Locomotion Ambulation   Ambulation assist      Assist level: Minimal Assistance - Patient > 75% Assistive device: No Device Max distance: 150'   Walk 10 feet activity   Assist     Assist level: Contact Guard/Touching assist Assistive device: No Device   Walk 50 feet activity   Assist    Assist level: Minimal Assistance - Patient > 75% Assistive device: No Device    Walk 150 feet activity   Assist    Assist level: Minimal Assistance - Patient >  75% Assistive device: No Device    Walk 10 feet on uneven surface  activity   Assist     Assist level: Minimal Assistance - Patient > 75% Assistive device: Other (comment) (No AD)   Wheelchair     Assist Is the patient using a wheelchair?: Yes Type of Wheelchair: Manual    Wheelchair assist level: Supervision/Verbal cueing Max wheelchair distance: 150'    Wheelchair 50 feet with 2 turns activity    Assist        Assist Level: Supervision/Verbal cueing   Wheelchair 150 feet activity     Assist      Assist Level: Supervision/Verbal cueing   Blood pressure 111/79, pulse 63, temperature 98.2 F (36.8 C), resp. rate 16, weight 93 kg, SpO2 98 %.  Medical Problem List and Plan: 1. Functional deficits secondary to PEA arrest/debility             -patient may  shower             -ELOS/Goals: 10-14 days- supervision to min A  -Continue CIR 2.  Antithrombotics: -DVT/anticoagulation:  Pharmaceutical: Other (comment) Eliquis.              -antiplatelet therapy: Plavix.  3. Pain Management: Oxycodone prn.  4. Mood/Behavior/Sleep: LCSW to follow for evaluation and support.              --Ambien helps him stay asleep longer (better than trazodone)             -antipsychotic agents: N/A  -Xanax PRN 0.25 BID started for anxiety, appears to be home medication. Will increase xanax PRN 0.25 to TID 5. Neuropsych/cognition: This patient is capable of making decisions on his own behalf. 6. Skin/Wound Care: Routine pressure relief measures.  7. Fluids/Electrolytes/Nutrition: Monitor I/O. Check CMET in am.  8. H/o new seizures: Continue Keppra 1500 mg BID 9. CAD/ICM/chronic combined CHF: Monitor for signs of overload. Daily weights --Continue Lipitor, Farxiga, Lasix, Entresto, atorvastatin and Plavix.   Filed Weights   09/13/22 0500 09/14/22 0500 09/15/22 0515  Weight: 101 kg 93.3 kg 93 kg               - no s/s fluid overload, likely unreliable weights; monitor  10.  V tach s/p AICD: On amiodarone and Metoprolol. Me 11. A fib: Monitor HR TID. On eliquis, amiodarone and metoprolol 12.  Rib fractures: Encourage pulmonary hygeine.   -Continue 3x lidocaine patches 13. Colon Ca w/mets: Xeloda to resume after discharge.  14. DM type 2  -Continue farxiga, SSI            -  10/2 few elevated CBGs, well continue monitor trend for now 15. Anemia  -HGB stable at 11.1 on 10/2, monitor 16. Leukocytosis-mild  -10/2 Improved to 10.8   LOS: 4 days A FACE TO FACE EVALUATION WAS PERFORMED  Jennye Boroughs 09/15/2022, 8:05 AM

## 2022-09-15 NOTE — Progress Notes (Signed)
Physical Therapy Session Note  Patient Details  Name: Gregory [name] MRN: [id number] Date of Birth: February 19, 1961  Today's Date: 09/15/2022 PT Individual Time: 1000-1057 PT Individual Time Calculation (min): 57 min   Short Term Goals: Week 1:  PT Short Term Goal 1 (Week 1): STG=LTG secondary to ELOS  Skilled Therapeutic Interventions/Progress Updates:   Received pt semi-reclined in bed, pt agreeable to PT treatment, and reported pain 8-9/10 in R ribcage - pt requested to wait until end of session for pain medication. Pt reported fatigue from previous therapy session and reported L knee buckling this morning and yesterday afternoon. Session with emphasis on functional mobility/transfers, generalized strengthening and endurance, and gait training. Pt transferred semi-reclined<>sitting EOB with HOB elevated and supervision - required seated rest break prior to sanding due to reports of dizziness. Slipped on crocs with supervision and stood from elevated EOB without AD and CGA and ambulated 1263f without AD and CGA to dayroom. Pt performed BUE/LE strengthening on Nustep at workload 4 increasing to 5 for 8 minutes for 332 steps with steps per minute >40 with emphasis on BUE/LE strengthening. Pt transferred Nustep<>mat stand<>pivot without AD and CGA and worked on stand<>sits from elevated EOB on Airex with emphasis on eccentric quad control x10 reps - pt unable to control past 90 degrees. Transitioned to seated LAQ with 7.5lb ankle weight 2x10 bilaterally - emphasis on quad strength and then performed seated hip flexion 1x8 bilaterally with 7.5lb ankle weights. Pt ambulated ~5663fwithout AD and CGA back towards room, but reported feeling like L knee was going to buckle - [facility] dependently. [facility] and ambulated [id number] AD and CGA back to bed and transferred sit<>semi-reclined with supervision. Concluded session with pt semi-reclined in bed,  needs within reach, and bed alarm on. Provided pt with fresh drink.    Therapy Documentation Precautions:  Precautions Precautions: Fall Precaution Comments: R Rib pain Restrictions Weight Bearing Restrictions: (P) No  Therapy/Group: Individual Therapy AnnAlfonse Alpers, DPT  09/15/2022, 7:06 AM

## 2022-09-15 NOTE — Progress Notes (Signed)
Occupational Therapy Session Note  Patient Details  Name: Gregory [name] MRN: [id number] Date of Birth: 1961-08-29  Today's Date: 09/15/2022 OT Individual Time: 9833-8250 , 1115-1200, 1400-1430 OT Individual Time Calculation (min): 75 min & 45 min & 30 min   Short Term Goals: Week 1:  OT Short Term Goal 1 (Week 1): Patient to perform toilet transfer with Min assist OT Short Term Goal 2 (Week 1): Patient to perform LB dressing with Min/CGA OT Short Term Goal 3 (Week 1): Patient to perform bathing with CGA OT Short Term Goal 4 (Week 1): Patient to perform shower transfer with Min assist  Skilled Therapeutic Interventions/Progress Updates:    Session 1: Upon OT arrival, pt supine in bed reporting no pain and is agreeable to OT treatment. Pt requesting to get cleaned up at sink. Treatment intervention with a focus on self care retraining, functional mobility, dynamic standing balance, strengthening, and endurance. Pt completes supine to sit transfer with SBA and stand step transfer into w/c with SBA. Pt propels himself infront of sink to wash face and complete oral care with Supervision. Pt completes sit to stand transfer with SBA and ambulates towards main therapy gym without device and CGA. Pt requires seated rest break completes stand to sit transfer with CGA and was transported to main therapy gym via w/c and total A for energy conservation. Pt completes sit to stand transfer with SBA and attempts to complete dynamic standing balance task without device to lift feet and perform toe taps onto cones and pt initially requires CGA but fades to Max A secondary to LOB requiring assist to correct and return to w/c. Task was graded down and pt ambulates to parallel bars with CGA and performs toe taps alternating feet until fatigued for 2 sets with B UE support and SBA. Pt reports lightheadedness and returns to w/c with CGA. Pt was positioned infront of tabletop and 2lb wrist weights were placed  onto B UE. Pt replicates completes Risk analyst of plastic pegs. Pt works at a slow pace demonstrating mod difficulty to replicate and has 4 errors. Pt requires verbal cues to recognize errors and was able to correct. Pt was transported to his room via w/c and total A for time and completes stand step transfer to bed with SBA. Pt returns to supine with SBA and was left in bed with all needs met and safety measures in place.   Session 2:  Upon OT arrival, pt semi recumbent in bed reporting no pain and is agreeable to OT treatment. Treatment intervention with a focus on functional transfers, UE strengthening, endurance, and quality of life. Pt complete supine to sit transfer with SBA and stand step transfer with SBA to w/c. Pt was transported outside per pt request to improve well being and overall quality of life. While seated in w/c pt completes B UE exercises for 2x10 reps including shoulder flex/ext, elbow flex/ext, shoulder abd/add, overhead press, supin/prona, wrist flex/ext. Pt tolerates all exercises well and was returned to his room at end of session with total A via w/c. Pt completes stand step transfer to bed with SBA and sit to supine transfer with SBA. Pt left in bed with NT present and all needs met.   Session 3: Upon OT arrival, pt semi recumbent in bed reporting 8/10 rib pain but is agreeable to OT treatment session. Treatment intervention with a focus on IADLs, dynamic standing balance, endurance. Pt completes supine to sit transfer with SBA and stand step transfer into  w/c with SBA. Pt was transported to ADL apartment via w/c and total A and engages in item retrieval task. Pt completes sit to stand transfer with SBA and ambulates within kitchen with CGA to retrieve plates, bowls, cups, silverware pots, and pants from cabinets and drawers, place them onto countertop and return them to their designated locations using B UE. Pt also retrieves items from pantry, transports items to tabletop and  returns them to the pantry. Pt completes with 1 LOB requiring Max A to correct secondary to fatigue. Pt educated on importance of energy conservation and taking rest breaks as needed versus over exerting himself. Pt verbalizes understanding. Pt was transported to main therapy gym via w/c and Total A and completes 10 sit<>stand transfers consecutively with CGA. Pt fatigued at end of task and was returned to his room via w/c and total A. Pt completes stand step transfer with CGA to his bed and sit to supine transfer with SBA. Pt was left in bed at end of session with all needs met and safety measures in place.   Therapy Documentation Precautions:  Precautions Precautions: Fall Precaution Comments: R Rib pain Restrictions Weight Bearing Restrictions: No     Therapy/Group: Individual Therapy  Marvetta Gibbons 09/15/2022, 8:56 AM

## 2022-09-16 ENCOUNTER — Other Ambulatory Visit (HOSPITAL_COMMUNITY): Payer: Self-pay

## 2022-09-16 DIAGNOSIS — E440 Moderate protein-calorie malnutrition: Secondary | ICD-10-CM

## 2022-09-16 DIAGNOSIS — E1165 Type 2 diabetes mellitus with hyperglycemia: Secondary | ICD-10-CM | POA: Diagnosis not present

## 2022-09-16 DIAGNOSIS — I4891 Unspecified atrial fibrillation: Secondary | ICD-10-CM

## 2022-09-16 DIAGNOSIS — D649 Anemia, unspecified: Secondary | ICD-10-CM | POA: Diagnosis not present

## 2022-09-16 DIAGNOSIS — I469 Cardiac arrest, cause unspecified: Secondary | ICD-10-CM | POA: Diagnosis not present

## 2022-09-16 LAB — GLUCOSE, CAPILLARY
Glucose-Capillary: 162 mg/dL — ABNORMAL HIGH (ref 70–99)
Glucose-Capillary: 157 mg/dL — ABNORMAL HIGH (ref 70–99)
Glucose-Capillary: 121 mg/dL — ABNORMAL HIGH (ref 70–99)

## 2022-09-16 NOTE — Progress Notes (Signed)
Occupational Therapy Session Note  Patient Details  Name: Gregory [name] MRN: [id number] Date of Birth: 01-07-1961  Today's Date: 09/16/2022 OT Individual Time: 1015-1100 OT Individual Time Calculation (min): 45 min    Short Term Goals: Week 1:  OT Short Term Goal 1 (Week 1): Patient to perform toilet transfer with Min assist OT Short Term Goal 2 (Week 1): Patient to perform LB dressing with Min/CGA OT Short Term Goal 3 (Week 1): Patient to perform bathing with CGA OT Short Term Goal 4 (Week 1): Patient to perform shower transfer with Min assist Week 2:     Skilled Therapeutic Interventions/Progress Updates:    1:1 Pt received in the bed when arrived. Pt has been able to void using the urinal mod I. Pt came to EOB mod I with extra time due to rib pain. Pt navigated around the room without AD with contact guard to close supervision to obtain his clothing and items. Pt performed grooming tasks and UB bathing at the sink in sitting. Pt dressed sit to stand with supervision without AD. Pt reports ready to go home and is doing well. Discussed progress and d/c planning with team (MD, CM, SW, and PT) for target d/c of Thursday. Discussed d/c planning and review of safety home measures. Pt ambulated for balance and cardiopulmonary endurance from room to the dayroom without AD with close supervision. Sat and rested on mat. Ambulated on the way back about 50 feet before wanting the RW for the rest of the walk back. Pt able to get into bed mod I with extra time. Pt left resting in bed with call bell.   Therapy Documentation Precautions:  Precautions Precautions: Fall Precaution Comments: R Rib pain Restrictions Weight Bearing Restrictions: No General:   Vital Signs: Therapy Vitals Temp: 97.7 F (36.5 C) Temp Source: Oral Pulse Rate: 65 Resp: 18 BP: 112/72 Patient Position (if appropriate): Lying Pain: No c/o pain in session    Therapy/Group: Individual Therapy  Willeen Cass [facility] 09/16/2022, 1:11 PM

## 2022-09-16 NOTE — Progress Notes (Signed)
Occupational Therapy Session Note  Patient Details  Name: Gregory [name] MRN: [id number] Date of Birth: 23-Nov-1961  Today's Date: 09/16/2022 OT Individual Time: 1400-1445 OT Individual Time Calculation (min): 45 min    Short Term Goals: Week 1:  OT Short Term Goal 1 (Week 1): Patient to perform toilet transfer with Min assist OT Short Term Goal 2 (Week 1): Patient to perform LB dressing with Min/CGA OT Short Term Goal 3 (Week 1): Patient to perform bathing with CGA OT Short Term Goal 4 (Week 1): Patient to perform shower transfer with Min assist  Skilled Therapeutic Interventions/Progress Updates:    Patient agreeable to participate in OT session. Reports 8/10 pain level bilateral lower ribs. States he will request pain medication after therapy session.  Patient participated in skilled OT session focusing on dynamic standing balance in preparation for returning home with planned discharge on Thursday this week. Therapist integrated balance activity while participating in wii bowling in order to improve standing tolerance and endurance and decrease risk of falls. Pt was able to stand with RW with SBA for 3'35" before requiring a seated rest break. Finished bowling seated to focus on visual perception, and UE strengthening. No report of pain in ribs during session. At end of session, pt utilized RW to complete functional transfer from w/c to bed with SBA.     Therapy Documentation Precautions:  Precautions Precautions: Fall Precaution Comments: R Rib pain Restrictions Weight Bearing Restrictions: No  Therapy/Group: Individual Therapy   Ailene Ravel, OTR/L,CBIS  Supplemental OT - [facility]  09/16/2022, 7:57 AM

## 2022-09-16 NOTE — Progress Notes (Signed)
Physical Therapy Session Note  Patient Details  Name: Gregory [name] MRN: [id number] Date of Birth: Jan 12, 1961  Today's Date: 09/16/2022 PT Individual Time: 8242-3536 and 1443-1540 PT Individual Time Calculation (min): 56 min and 45 min  Short Term Goals: Week 1:  PT Short Term Goal 1 (Week 1): STG=LTG secondary to ELOS  Skilled Therapeutic Interventions/Progress Updates:     Session 1: Patient in bed upon PT arrival. Patient alert and agreeable to PT session. Patient reported 7/10 R rib pain during session, RN made aware. PT provided repositioning, rest breaks, and distraction as pain interventions throughout session.   Discussed d/c planning, POC, HPI, daily routine, and exercise hx with patient throughout session. Patient required increased time for initiation, rest breaks, and for completion of tasks throughout session due to decreased activity tolerance. Utilized therapeutic use of self throughout to promote efficiency.   Therapeutic Activity: Bed Mobility: Patient performed supine to/from sit with mod I from a flat bed without use of bed rails.  Transfers: Patient performed sit to/from stand x2 and stand pivot x1 with CGA. Provided verbal cues for forward weight shift and increased step length of turns to prevent shuffling steps.  Blocked practice sit to stand 2x5 without AD from lowest mat height focused of forward weight shift and balance with functional transfers.   Gait Training:  Patient ambulated >120 feet without AD with CGA and w/c follow due to decreased activity tolerance, he then ambulated 25 feet without AD with CGA then ~100 feet using a RW with supervision with w/c follow due to decreased activity tolerance. Ambulated with decreased gait speed, decreased step length and height, narrow BOS, forward trunk lean, and downward head gaze. Provided verbal cues for looking ahead, paced breathing, increased step length and height for improved gait speed, and reduced  upper extremity use with RW. Patient with improved balance and activity tolerance with use of RW.   Patient in bed at end of session with breaks locked, bed alarm set, and all needs within reach.   Session 2: Patient in bed upon PT arrival. Patient alert and agreeable to PT session. Patient reported 4/10 R rib pain during session, RN made aware. PT provided repositioning, rest breaks, and distraction as pain interventions throughout session.   Patient eager to d/c home. When discussed recommendation for OPPT for follow-up, patient reported he would not want to have to go out to therapy or have anyone come to his house. Educated on the benefits of progressing activity tolerance, reducing fall risk, and initiating community integration with skilled PT. Patient will need further reinforcement.   Therapeutic Activity: Bed Mobility: Patient performed supine to/from sit independently in a flat bed without use of bed rails. Reports he only sits EOB or lays in the bed at home.  Transfers: Patient performed stand pivot bed<>w/c and sit to/from stand x2 with distant supervision using RW. Provided verbal cues for safe use of AD x1.  Gait Training:  Patient ambulated 50 feet using RW simulating household distance with distant supervision without w/c follow. Ambulated as above. Provided verbal cues for staying close to RW during turns. Patient ascended/descended 16x6" steps using B rails with supervision per home set-up. Performed reciprocal gait pattern throughout. Provided min cues for technique and sequencing.   Patient in bed at end of session with breaks locked, bed alarm set, and all needs within reach.   Therapy Documentation Precautions:  Precautions Precautions: Fall Precaution Comments: R Rib pain Restrictions Weight Bearing Restrictions: No  Therapy/Group: Individual Therapy  Vineeth Fell L [place] Crosland PT, DPT, NCS, CBIS  09/16/2022, 5:34 PM

## 2022-09-16 NOTE — Progress Notes (Addendum)
PROGRESS NOTE   Subjective/Complaints: Working with therapy this AM. He reports he slept better yesterday. We discussed purpose of fluid restriction.   LBM 10/2  ROS: Denies fevers, chills, HA, N/V/D/C, abdominal pain,SOB, cough, chest pain, new weakness or paraesthesias.    Objective:   No results found. Recent Labs    09/15/22 0608  WBC 10.8*  HGB 11.1*  HCT 33.8*  PLT 333    Recent Labs    09/15/22 0608  NA 137  K 4.2  CL 105  CO2 24  GLUCOSE 194*  BUN 14  CREATININE 0.86  CALCIUM 8.1*     Intake/Output Summary (Last 24 hours) at 09/16/2022 0900 Last data filed at 09/16/2022 0700 Gross per 24 hour  Intake 798 ml  Output 2475 ml  Net -1677 ml          Physical Exam: Vital Signs Blood pressure 107/79, pulse 62, temperature 98.4 F (36.9 C), temperature source Oral, resp. rate 20, weight 90.8 kg, SpO2 96 %.   General: Alert and oriented x 4, No apparent distress HEENT: Head is normocephalic, atraumatic, conjugate gaze, MMM, Fatty deposits outer cantus bilaterally- chronic   Neck: Supple without JVD or lymphadenopathy Heart: Reg rate and rhythm. No murmurs rubs or gallops  Chest: CTA bilaterally without wheezes, rales, or rhonchi; no distress, good air movement Abdomen: Soft, nontender, non-distended, bowel sounds positive. Extremities: No clubbing, cyanosis, or edema. Pulses are 2+  Musculoskeletal:        Comments:antigravity all 4 extremities Skin: C/D/I.  Neurological:     General: No focal deficit present. Follows commands.    Mental Status: He is alert and oriented to person, place, and time.  Psychiatric:        Mood and Affect: normal affect, pleasant    Behavior: Behavior normal.    Assessment/Plan: 1. Functional deficits which require 3+ hours per day of interdisciplinary therapy in a comprehensive inpatient rehab setting. Physiatrist is providing close team supervision and 24  hour management of active medical problems listed below. Physiatrist and rehab team continue to assess barriers to discharge/monitor patient progress toward functional and medical goals  Care Tool:  Bathing    Body parts bathed by patient: Right arm, Left arm, Chest, Abdomen, Front perineal area, Buttocks, Face, Right upper leg, Left upper leg, Left lower leg, Right lower leg   Body parts bathed by helper: Left lower leg, Right lower leg     Bathing assist Assist Level: Minimal Assistance - Patient > 75%     Upper Body Dressing/Undressing Upper body dressing   What is the patient wearing?: Pull over shirt    Upper body assist Assist Level: Set up assist    Lower Body Dressing/Undressing Lower body dressing      What is the patient wearing?: Pants     Lower body assist Assist for lower body dressing: Minimal Assistance - Patient > 75%     Toileting Toileting    Toileting assist Assist for toileting: Independent with assistive device Assistive Device Comment: urinal   Transfers Chair/bed transfer  Transfers assist     Chair/bed transfer assist level: Contact Guard/Touching assist     Locomotion Ambulation  Ambulation assist      Assist level: Contact Guard/Touching assist Assistive device: No Device Max distance: 118f   Walk 10 feet activity   Assist     Assist level: Contact Guard/Touching assist Assistive device: No Device   Walk 50 feet activity   Assist    Assist level: Contact Guard/Touching assist Assistive device: No Device    Walk 150 feet activity   Assist    Assist level: Minimal Assistance - Patient > 75% Assistive device: No Device    Walk 10 feet on uneven surface  activity   Assist     Assist level: Minimal Assistance - Patient > 75% Assistive device: Other (comment) (No AD)   Wheelchair     Assist Is the patient using a wheelchair?: Yes Type of Wheelchair: Manual    Wheelchair assist level:  Supervision/Verbal cueing Max wheelchair distance: 150'    Wheelchair 50 feet with 2 turns activity    Assist        Assist Level: Supervision/Verbal cueing   Wheelchair 150 feet activity     Assist      Assist Level: Supervision/Verbal cueing   Blood pressure 107/79, pulse 62, temperature 98.4 F (36.9 C), temperature source Oral, resp. rate 20, weight 90.8 kg, SpO2 96 %.  Medical Problem List and Plan: 1. Functional deficits secondary to PEA arrest/debility             -patient may  shower             -ELOS/Goals: 10-14 days- supervision to min A  -Continue CIR 2.  Antithrombotics: -DVT/anticoagulation:  Pharmaceutical: Other (comment) Eliquis.              -antiplatelet therapy: Plavix.  3. Pain Management: Oxycodone prn.  4. Mood/Behavior/Sleep: LCSW to follow for evaluation and support.              --Ambien helps him stay asleep longer (better than trazodone)             -antipsychotic agents: N/A  -Xanax PRN 0.25 BID started for anxiety, appears to be home medication. Will increase xanax PRN 0.25 to TID 5. Neuropsych/cognition: This patient is capable of making decisions on his own behalf. 6. Skin/Wound Care: Routine pressure relief measures.  7. Fluids/Electrolytes/Nutrition: Monitor I/O. Check CMET in am.  8. H/o new seizures: Continue Keppra 1500 mg BID 9. CAD/ICM/chronic combined CHF: Monitor for signs of overload. Daily weights --Continue Lipitor, Farxiga, Lasix, Entresto, atorvastatin and Plavix.   Filed Weights   09/14/22 0500 09/15/22 0515 09/16/22 0442  Weight: 93.3 kg 93 kg 90.8 kg               - no s/s fluid overload, likely unreliable weights; monitor  -Weight a little downtrending, continue to monitor, CMP tomorrow 10. V tach s/p AICD: On amiodarone and Metoprolol. Me 11. A fib: Monitor HR TID. On eliquis, amiodarone and metoprolol  -HR well controlled 12.  Rib fractures: Encourage pulmonary hygeine.   -Continue 3x lidocaine patches 13.  Colon Ca w/mets: Xeloda to resume after discharge.  143 DM type 2  -Continue farxiga, SSI            - 10/3 fairly well controlled, continue current regimen 15. Anemia  -HGB stable at 11.1 on 10/2, monitor  -Recheck tomorrow 16. Leukocytosis-mild  -10/2 Improved to 10.8  17. Moderate malnutrition with low albumin  - will order dietician consult, although appears to be eating better  LOS: 5 days A  FACE TO FACE EVALUATION WAS PERFORMED  Jennye Boroughs 09/16/2022, 9:00 AM

## 2022-09-16 NOTE — Progress Notes (Signed)
Patient ID: Gregory [name], male   DOB: December 05, 1961, 61 y.o.   MRN: [id number]  Team feeling pt getting cloe to goals and want to plan discharge later this week. Pt does not want home health or OP therapies feels not useful. Has all equipment. PT concerned his knee buckles and may try a brace for this. Pt reports this is longstanding. Await team's decision regarding discharge date. Pt is ready to go home soon.

## 2022-09-17 DIAGNOSIS — I5042 Chronic combined systolic (congestive) and diastolic (congestive) heart failure: Secondary | ICD-10-CM | POA: Diagnosis not present

## 2022-09-17 DIAGNOSIS — R569 Unspecified convulsions: Secondary | ICD-10-CM | POA: Diagnosis not present

## 2022-09-17 DIAGNOSIS — I469 Cardiac arrest, cause unspecified: Secondary | ICD-10-CM | POA: Diagnosis not present

## 2022-09-17 DIAGNOSIS — I4891 Unspecified atrial fibrillation: Secondary | ICD-10-CM | POA: Diagnosis not present

## 2022-09-17 LAB — COMPREHENSIVE METABOLIC PANEL
ALT: 62 U/L — ABNORMAL HIGH (ref 0–44)
AST: 52 U/L — ABNORMAL HIGH (ref 15–41)
Albumin: 2.7 g/dL — ABNORMAL LOW (ref 3.5–5.0)
Alkaline Phosphatase: 197 U/L — ABNORMAL HIGH (ref 38–126)
Anion gap: 9 (ref 5–15)
BUN: 13 mg/dL (ref 8–23)
CO2: 26 mmol/L (ref 22–32)
Calcium: 8.3 mg/dL — ABNORMAL LOW (ref 8.9–10.3)
Chloride: 102 mmol/L (ref 98–111)
Creatinine, Ser: 1.01 mg/dL (ref 0.61–1.24)
GFR, Estimated: >60 mL/min (ref >60)
Glucose, Bld: 134 mg/dL — ABNORMAL HIGH (ref 70–99)
Potassium: 4.7 mmol/L (ref 3.5–5.1)
Sodium: 137 mmol/L (ref 135–145)
Total Bilirubin: 0.1 mg/dL — ABNORMAL LOW (ref 0.3–1.2)
Total Protein: 6.9 g/dL (ref 6.5–8.1)

## 2022-09-17 LAB — CBC
HCT: 37.6 % — ABNORMAL LOW (ref 39.0–52.0)
Hemoglobin: 11.9 g/dL — ABNORMAL LOW (ref 13.0–17.0)
MCH: 31.6 pg (ref 26.0–34.0)
MCHC: 31.6 g/dL (ref 30.0–36.0)
MCV: 99.7 fL (ref 80.0–100.0)
Platelets: 359 10*3/uL (ref 150–400)
RBC: 3.77 MIL/uL — ABNORMAL LOW (ref 4.22–5.81)
RDW: 22 % — ABNORMAL HIGH (ref 11.5–15.5)
WBC: 8.1 10*3/uL (ref 4.0–10.5)
nRBC: 0 % (ref 0.0–0.2)

## 2022-09-17 LAB — GLUCOSE, CAPILLARY
Glucose-Capillary: 162 mg/dL — ABNORMAL HIGH (ref 70–99)
Glucose-Capillary: 125 mg/dL — ABNORMAL HIGH (ref 70–99)
Glucose-Capillary: 177 mg/dL — ABNORMAL HIGH (ref 70–99)
Glucose-Capillary: 137 mg/dL — ABNORMAL HIGH (ref 70–99)

## 2022-09-17 NOTE — Patient Care Conference (Signed)
Inpatient RehabilitationTeam Conference and Plan of Care Update Date: 09/17/2022   Time: 12:16 PM   Patient Name: Gregory [name]      Medical Record Number: [id number]  Date of Birth: August 08, 1961 Sex: Male         Room/Bed: 4W01C/4W01C-01 Payor Info: Payor: [employer] / Plan: [employer]IC [employer]ERCIAL / Product Type: *No Product type* /    Admit Date/Time:  09/11/2022  3:35 PM  Primary Diagnosis:  Cardiac arrest [facility])  Hospital Problems: Principal Problem:   Cardiac arrest [facility]) Active Problems:   Chronic combined systolic and diastolic congestive heart failure ([place])   Atrial fibrillation ([place])   Seizure [facility])    Expected Discharge Date: Expected Discharge Date: 09/18/22  Team Members Present: Physician leading conference: Dr. Jennye Boroughs Social Worker Present: Ovidio Kin, LCSW Nurse Present: Dorien Chihuahua, RN PT Present: Other (comment) Jodi Mourning Rafoth, PT) OT Present: Laverle Hobby, OT SLP Present: Charolett Bumpers, SLP PPS Coordinator present : Ileana Ladd, PT     Current Status/Progress Goal Weekly Team Focus  Bowel/Bladder     Continent of bowel and bladder        Swallow/Nutrition/ Hydration             ADL's   At goal level- supervision with ADLs and transfers  Supervision overall  d/c planning, ADL, transfers, activity tolerance   Mobility   Supv/ModI with functional mobility with the use of a RW; Supv for stairs, up to 16 with B HR.  ModI  Endurance/activity tolerance; B LE strengthening; Muscular endurance   Communication             Safety/Cognition/ Behavioral Observations            Pain     Right rib cage pain post CPR   Prn meds for pain   Assess need for and effectiveness of meds  Skin     N/a          Discharge Planning:  Home with son who works from home, reached mod/i-supervision and declines OP and [place] services. Ready for DC tomorrow.   Team Discussion: Patient doing well overall. Patient on target to meet rehab  goals: yes, currently needs supervision for ADLs and is mod I with a RW. Able to manage 16 steps.   *See Care Plan and progress notes for long and short-term goals.   Revisions to Treatment Plan:  HEP provided to patient  Teaching Needs: Safety, medications, dietary modification, transfers/steps, etc  Current Barriers to Discharge: Decreased caregiver support and Home enviroment access/layout  Possible Resolutions to Barriers: Family education HH follow up services; patient declined DME; already has a Psychiatrist               I attest that I was present, lead the team conference, and concur with the assessment and plan of the team.   Dorien Chihuahua B 09/17/2022, 5:15 PM

## 2022-09-17 NOTE — Progress Notes (Incomplete)
Inpatient Rehabilitation Care Coordinator Discharge Note   Patient Details  Name: Gregory [name] MRN: [id number] Date of Birth: [birth year]   Discharge location: [place] AND CAN PROVIDE SUPERVISION  Length of Stay: 7 DAYS  Discharge activity level: MOD/I-SUPERVISION LEVEL  Home/community participation: ACTIVE  Patient response HK:UVJDYN Literacy - How often do you need to have someone help you when you read instructions, pamphlets, or other written material from your doctor or pharmacy?: Never  Patient response XG:ZFPOIP Isolation - How often do you feel lonely or isolated from those around you?: Never  Services provided included: MD, RD, PT, OT, RN, CM, Pharmacy, SW  Financial Services:  Financial Services Utilized: Worker's Comp    Choices offered to/list presented to: PT  Follow-up services arranged:  Other (Comment) (HAS ALL EQUIPMENT FROM PREVIOUS ADMITS AND DECLINED HOME HEALTH AND OP)           Patient response to transportation need: Is the patient able to respond to transportation needs?: Yes In the past 12 months, has lack of transportation kept you from medical appointments or from getting medications?: No In the past 12 months, has lack of transportation kept you from meetings, work, or from getting things needed for daily living?: No    Comments (or additional information): PT REACHED GOALS QUICKLY AND IS [place]. FEELS MUCH BETTER AND READY TO GO HOME  Patient/Family verbalized understanding of follow-up arrangements:  Yes  Individual responsible for coordination of the follow-up plan: Gregory [name] [phone]  Confirmed correct DME delivered: Gregory [name] 09/17/2022    Gregory [name]

## 2022-09-17 NOTE — Progress Notes (Addendum)
Patient ID: Gregory [name], male   DOB: 1961/01/05, 61 y.o.   MRN: [id number]  Pt reaching goals of mod/I-supervision and ready for discharge tomorrow. Pt is in agreement and son is aware. Team feels met goals and has all DME and declines follow up-OP or HH  1:09 PM Updated pt on team conference goals and he is aware of being ready to discharge tomorrow. He reports he will talk with son and let him know.

## 2022-09-17 NOTE — Progress Notes (Signed)
Occupational Therapy Session Note  Patient Details  Name: [name] MRN: [id number] Date of Birth: 1961-04-15  Today's Date: 09/17/2022 OT Individual Time: 8022-1798 OT Individual Time Calculation (min): 55 min    Short Term Goals: Week 1:  OT Short Term Goal 1 (Week 1): Patient to perform toilet transfer with Min assist OT Short Term Goal 2 (Week 1): Patient to perform LB dressing with Min/CGA OT Short Term Goal 3 (Week 1): Patient to perform bathing with CGA OT Short Term Goal 4 (Week 1): Patient to perform shower transfer with Min assist  Skilled Therapeutic Interventions/Progress Updates:    Pt received supine with 5/10 rib pain- no request for intervention, agreeable to OT session. He completed bed mobility independently and stood from EOB. He completed an ambulatory transfer into the bathroom with (S) using the RW. He voided BM and completed hygiene with (S). He initially stated he would take a shower and then declined d/t not having a change of clothes. He returned to the w/c following with (S). He completed grooming tasks at the sink with set up assist. He completed 150 ft of functional mobility to the therapy room without a RW, supervision overall. In the gym he completed functional stepping over several hurdles with very poor clearance and min A to not loose balance. Discussed recommendations to have f/u therapy pt declined despite encouragement. He returned to his room and was left supine with all needs met.    Therapy Documentation Precautions:  Precautions Precautions: Fall Precaution Comments: R Rib pain Restrictions Weight Bearing Restrictions: No  Therapy/Group: Individual Therapy  Curtis Sites 09/17/2022, 6:28 AM

## 2022-09-17 NOTE — Progress Notes (Signed)
PROGRESS NOTE   Subjective/Complaints: Working in the gym with therapy. No new concerns this AM. He is happy he is going home soon.   LBM 10/2  ROS: Denies fevers, chills, HA, N/V/D/C, abdominal pain,SOB, cough, chest pain, new weakness or paraesthesias.    Objective:   No results found. Recent Labs    09/15/22 0608 09/17/22 0851  WBC 10.8* 8.1  HGB 11.1* 11.9*  HCT 33.8* 37.6*  PLT 333 359    Recent Labs    09/15/22 0608  NA 137  K 4.2  CL 105  CO2 24  GLUCOSE 194*  BUN 14  CREATININE 0.86  CALCIUM 8.1*     Intake/Output Summary (Last 24 hours) at 09/17/2022 0927 Last data filed at 09/17/2022 0747 Gross per 24 hour  Intake 1115 ml  Output 2725 ml  Net -1610 ml          Physical Exam: Vital Signs Blood pressure 104/72, pulse 63, temperature 97.9 F (36.6 C), resp. rate 15, weight 98.1 kg, SpO2 97 %.   General: Alert and oriented x 4, No apparent distress. Working in gym HEENT: Head is normocephalic, atraumatic, conjugate gaze, MMM, Fatty deposits outer cantus bilaterally- chronic   Neck: Supple without JVD or lymphadenopathy Heart: Reg rate and rhythm. No murmurs rubs or gallops  Chest: CTA bilaterally without wheezes, rales, or rhonchi; no distress, good air movement Abdomen: Soft, nontender, non-distended, bowel sounds positive. Extremities: No clubbing, cyanosis, or edema. Pulses are 2+  Musculoskeletal:        Comments:antigravity all 4 extremities Skin: warm and dry Neurological:     General: No focal deficit present. Follows commands.    Mental Status: He is alert and oriented to person, place, and time.  Psychiatric:        Mood and Affect: normal affect, pleasant    Behavior: Behavior normal.    Assessment/Plan: 1. Functional deficits which require 3+ hours per day of interdisciplinary therapy in a comprehensive inpatient rehab setting. Physiatrist is providing close team  supervision and 24 hour management of active medical problems listed below. Physiatrist and rehab team continue to assess barriers to discharge/monitor patient progress toward functional and medical goals  Care Tool:  Bathing    Body parts bathed by patient: Right arm, Left arm, Chest, Abdomen, Front perineal area, Buttocks, Face, Right upper leg, Left upper leg, Left lower leg, Right lower leg   Body parts bathed by helper: Left lower leg, Right lower leg     Bathing assist Assist Level: Supervision/Verbal cueing     Upper Body Dressing/Undressing Upper body dressing   What is the patient wearing?: Pull over shirt    Upper body assist Assist Level: Set up assist    Lower Body Dressing/Undressing Lower body dressing      What is the patient wearing?: Pants     Lower body assist Assist for lower body dressing: Supervision/Verbal cueing     Toileting Toileting    Toileting assist Assist for toileting: Independent with assistive device Assistive Device Comment: urinal   Transfers Chair/bed transfer  Transfers assist     Chair/bed transfer assist level: Supervision/Verbal cueing Chair/bed transfer assistive device: Gilford Rile  Locomotion Ambulation   Ambulation assist      Assist level: Supervision/Verbal cueing Assistive device: Walker-rolling Max distance: 100 ft   Walk 10 feet activity   Assist     Assist level: Supervision/Verbal cueing Assistive device: Walker-rolling   Walk 50 feet activity   Assist    Assist level: Supervision/Verbal cueing Assistive device: Walker-rolling    Walk 150 feet activity   Assist    Assist level: Minimal Assistance - Patient > 75% Assistive device: No Device    Walk 10 feet on uneven surface  activity   Assist     Assist level: Minimal Assistance - Patient > 75% Assistive device: Other (comment) (No AD)   Wheelchair     Assist Is the patient using a wheelchair?: Yes Type of Wheelchair:  Manual    Wheelchair assist level: Supervision/Verbal cueing Max wheelchair distance: 150'    Wheelchair 50 feet with 2 turns activity    Assist        Assist Level: Supervision/Verbal cueing   Wheelchair 150 feet activity     Assist      Assist Level: Supervision/Verbal cueing   Blood pressure 104/72, pulse 63, temperature 97.9 F (36.6 C), resp. rate 15, weight 98.1 kg, SpO2 97 %.  Medical Problem List and Plan: 1. Functional deficits secondary to PEA arrest/debility             -patient may  shower             -ELOS/Goals: 10-14 days- supervision to min A  -Continue CIR  -Team conference today 2.  Antithrombotics: -DVT/anticoagulation:  Pharmaceutical: Other (comment) Eliquis.              -antiplatelet therapy: Plavix.  3. Pain Management: Oxycodone prn.  4. Mood/Behavior/Sleep: LCSW to follow for evaluation and support.              --Ambien helps him stay asleep longer (better than trazodone)             -antipsychotic agents: N/A  -Xanax PRN 0.25 BID started for anxiety, appears to be home medication. Will increase xanax PRN 0.25 to TID 5. Neuropsych/cognition: This patient is capable of making decisions on his own behalf. 6. Skin/Wound Care: Routine pressure relief measures.  7. Fluids/Electrolytes/Nutrition: Monitor I/O. Check CMET in am.  8. H/o new seizures: Continue Keppra 1500 mg BID  -No recent seizure activity, continue current 9. CAD/ICM/chronic combined CHF: Monitor for signs of overload. Daily weights --Continue Lipitor, Farxiga, Lasix, Entresto, atorvastatin and Plavix.   Filed Weights   09/15/22 0515 09/16/22 0442 09/17/22 0500  Weight: 93 kg 90.8 kg 98.1 kg               - no s/s fluid overload, likely unreliable weights; monitor  -Weight a little downtrending, continue to monitor, CMP tomorrow  -Weights appear inaccurate, no signs of overload 10. V tach s/p AICD: On amiodarone and Metoprolol. Me 11. A fib: Monitor HR TID. On eliquis,  amiodarone and metoprolol  -10/4 HR continues to be well controlled 12.  Rib fractures: Encourage pulmonary hygeine.   -Continue 3x lidocaine patches 13. Colon Ca w/mets: Xeloda to resume after discharge.  6. DM type 2  -Continue farxiga, SSI            - 10/3 fairly well controlled, continue current regimen 15. Anemia  -HGB stable at 11.1 on 10/2, monitor  -Recheck tomorrow 16. Leukocytosis-mild  -10/2 Improved to 10.8  17. Moderate malnutrition with  low albumin  - will order dietician consult, although appears to be eating better  LOS: 6 days A FACE TO [place] 09/17/2022, 9:27 AM

## 2022-09-17 NOTE — Progress Notes (Signed)
Occupational Therapy Discharge Summary  Patient Details  Name: Gregory [name] MRN: [id number] Date of Birth: 05-28-1961  Date of Discharge from [place] 4, 2023   Patient has met 10 of 10 long term goals due to improved activity tolerance, improved balance, postural control, ability to compensate for deficits, improved attention, improved awareness, and improved coordination.  Patient to discharge at overall Supervision level.  Patient's care partner is independent to provide the necessary physical assistance at discharge. Gregory [name] has a supportive son who he lives with that will provide any care he needs. He has made quick progress in CIR and progressed to a (S)- mod I level overall with the RW. He has the necessary equipment at home.    Recommendation:  Patient will benefit from ongoing skilled OT services in home health setting to continue to advance functional skills in the area of BADL and iADL, however pt declining these services stating he is at baseline.  Equipment: No equipment provided  Reasons for discharge: treatment goals met and discharge from hospital  Patient/family agrees with progress made and goals achieved: Yes  OT Discharge Precautions/Restrictions  Precautions Precautions: Fall Precaution Comments: R Rib pain Restrictions Weight Bearing Restrictions: No  Pain Pain Assessment Pain Scale: 0-10 Pain Score: 5  Pain Type: Acute pain Pain Location: Rib cage Pain Orientation: Right Pain Descriptors / Indicators: Sharp;Constant Pain Onset: On-going Pain Intervention(s): Shower ADL ADL Eating: Independent Where Assessed-Eating: Chair Grooming: Modified independent Where Assessed-Grooming: Sitting at sink Upper Body Bathing: Setup Where Assessed-Upper Body Bathing: Shower Lower Body Bathing: Supervision/safety Where Assessed-Lower Body Bathing: Shower Upper Body Dressing: Supervision/safety Where Assessed-Upper Body Dressing: Edge of  bed Lower Body Dressing: Supervision/safety Where Assessed-Lower Body Dressing: Edge of bed Toileting: Supervision/safety Where Assessed-Toileting: Glass blower/designer: Distant supervision Armed forces technical officer Method: Arts development officer: Radiographer, therapeutic: Not assessed Social research officer, government: Distant supervision Social research officer, government Method: Heritage manager: Radio broadcast assistant ADL Comments: Patient with good ADL tolerance. Had increased pain with sit to stand transfers- requiring Mod assist from raised surface. Vision Baseline Vision/History: 0 No visual deficits Patient Visual Report: No change from baseline Vision Assessment?: No apparent visual deficits Perception  Perception: Within Functional Limits Praxis Praxis: Intact Cognition Cognition Overall Cognitive Status: Within Functional Limits for tasks assessed Arousal/Alertness: Awake/alert Orientation Level: Person;Place;Situation Person: Oriented Place: Oriented Situation: Oriented Memory: Appears intact Safety/Judgment: Appears intact Brief Interview for Mental Status (BIMS) Repetition of Three Words (First Attempt): 3 Temporal Orientation: Year: Correct Temporal Orientation: Month: Accurate within 5 days Temporal Orientation: Day: Incorrect Recall: "Sock": Yes, no cue required Recall: "Blue": Yes, no cue required Recall: "Bed": Yes, no cue required BIMS Summary Score: 14 Sensation Sensation Light Touch: Appears Intact Hot/Cold: Appears Intact Proprioception: Appears Intact Stereognosis: Appears Intact Coordination Gross Motor Movements are Fluid and Coordinated: Yes Fine Motor Movements are Fluid and Coordinated: Yes Motor  Motor Motor: Within Functional Limits Mobility  Bed Mobility Bed Mobility: Rolling Right;Rolling Left;Supine to Sit;Sit to Supine Rolling Right: Independent Rolling Left: Independent Supine to Sit: Independent Sit to Supine:  Independent Transfers Sit to Stand: Independent with assistive device Stand to Sit: Independent with assistive device  Trunk/Postural Assessment  Cervical Assessment Cervical Assessment: Within Functional Limits Thoracic Assessment Thoracic Assessment: Within Functional Limits Lumbar Assessment Lumbar Assessment: Within Functional Limits Postural Control Postural Control: Deficits on evaluation Righting Reactions: Delayed  Balance Balance Balance Assessed: Yes Static Sitting Balance Static Sitting - Balance Support: Feet supported;Bilateral upper extremity supported Static  Sitting - Level of Assistance: 7: Independent Dynamic Sitting Balance Dynamic Sitting - Balance Support: Feet supported;During functional activity Dynamic Sitting - Level of Assistance: 7: Independent Static Standing Balance Static Standing - Balance Support: During functional activity;Bilateral upper extremity supported Static Standing - Level of Assistance: 6: Modified independent (Device/Increase time) Dynamic Standing Balance Dynamic Standing - Balance Support: During functional activity;Bilateral upper extremity supported Dynamic Standing - Level of Assistance: 5: Stand by assistance Extremity/Trunk Assessment RUE Assessment RUE Assessment: Within Functional Limits General Strength Comments: Pt reports being at baseline LUE Assessment LUE Assessment: Within Functional Limits General Strength Comments: Pt reports being at baseline   Curtis Sites 09/17/2022, 7:49 AM

## 2022-09-17 NOTE — Progress Notes (Signed)
Physical Therapy Discharge Summary  Patient Details  Name: Gregory [name] MRN: [id number] Date of Birth: [birth year]  Date of Discharge from PT service:September 17, 2022  Today's Date: 09/17/2022 PT Individual Time: 1st Treatment Session: 3299-2426; 2nd Treatment Session: 1330-1430 PT Individual Time Calculation (min): 75 min; 60 min   Patient has met 8 of 8 long term goals due to improved activity tolerance, improved balance, increased strength, and decreased pain.  Patient to discharge at an ambulatory level Modified Independent. Patient's care partner is independent to provide any necessary assistance at discharge.  Reasons goals not met: NA  Recommendation:  Patient will benefit from ongoing skilled PT services in outpatient setting to continue to advance safe functional mobility, address ongoing impairments in strength, dynamic stability, endurance/activity tolerance, muscular endurance and minimize fall risk; However, patient has denied any follow-up services at this time. Patient was educated on benefits of continued therapy upon discharge in order to sustain independence with functional mobility. Patient provided with HEP.   Equipment: Patient has all necessary equipment at home.   Reasons for discharge: treatment goals met and discharge from hospital  Patient/family agrees with progress made and goals achieved: Yes  PT Discharge Precautions/Restrictions Precautions Precautions: Fall Precaution Comments: R Rib pain Restrictions Weight Bearing Restrictions: No Pain Interference Pain Interference Pain Effect on Sleep: 3. Frequently Pain Interference with Therapy Activities: 1. Rarely or not at all Pain Interference with Day-to-Day Activities: 1. Rarely or not at all Vision/Perception  Vision - History Ability to See in Adequate Light: 0 Adequate Perception Perception: Within Functional Limits Praxis Praxis: Intact  Cognition Overall Cognitive Status: Within  Functional Limits for tasks assessed Arousal/Alertness: Awake/alert Orientation Level: Oriented X4 Year: 2023 Month: October Day of Week: Correct Sustained Attention: Appears intact Memory: Appears intact Awareness: Appears intact Problem Solving: Appears intact Safety/Judgment: Appears intact Sensation Sensation Light Touch: Appears Intact Hot/Cold: Appears Intact Proprioception: Appears Intact Stereognosis: Appears Intact Coordination Gross Motor Movements are Fluid and Coordinated: Yes Fine Motor Movements are Fluid and Coordinated: Yes Finger Nose Finger Test: No over/undershooting noted Motor  Motor Motor: Within Functional Limits Motor - Discharge Observations: Impaired muscular endurance noted  Mobility Bed Mobility Bed Mobility: Rolling Right;Rolling Left;Supine to Sit;Sit to Supine Rolling Right: Independent with assistive device Rolling Left: Independent with assistive device Supine to Sit: Independent with assistive device Sit to Supine: Independent with assistive device Transfers Transfers: Sit to Stand;Stand to Sit;Stand Pivot Transfers Sit to Stand: Independent with assistive device Stand to Sit: Independent with assistive device Stand Pivot Transfers: Independent with assistive device Transfer (Assistive device): Rolling walker Locomotion  Gait Ambulation: Yes Gait Assistance: Independent with assistive device Gait Distance (Feet): 150 Feet Assistive device: Rolling walker Gait Gait: Yes Gait Pattern: Step-through pattern Gait velocity: decreased Stairs / Additional Locomotion Stairs: Yes Stairs Assistance: Independent with assistive device Stair Management Technique: Two rails Number of Stairs: 16 Height of Stairs: 6 Ramp: Independent with assistive device Curb: Independent with assistive Geographical information systems officer: Yes Wheelchair Assistance: Independent with Camera operator: Both upper  extremities Wheelchair Parts Management: Independent Distance: 150  Trunk/Postural Assessment  Cervical Assessment Cervical Assessment: Within Functional Limits Thoracic Assessment Thoracic Assessment: Within Functional Limits Lumbar Assessment Lumbar Assessment: Within Functional Limits Postural Control Postural Control: Deficits on evaluation Righting Reactions: Delayed  Balance Balance Balance Assessed: Yes Standardized Balance Assessment Standardized Balance Assessment: Berg Balance Test Berg Balance Test Sit to Stand: Able to stand  independently using hands Standing Unsupported: Able to stand safely  2 minutes Sitting with Back Unsupported but Feet Supported on Floor or Stool: Able to sit safely and securely 2 minutes Stand to Sit: Sits safely with minimal use of hands Transfers: Able to transfer safely, definite need of hands Standing Unsupported with Eyes Closed: Able to stand 10 seconds safely Standing Ubsupported with Feet Together: Able to place feet together independently and stand for 1 minute with supervision From Standing, Reach Forward with Outstretched Arm: Can reach forward >12 cm safely (5") From Standing Position, Pick up Object from Floor: Able to pick up shoe safely and easily From Standing Position, Turn to Look Behind Over each Shoulder: Turn sideways only but maintains balance Turn 360 Degrees: Able to turn 360 degrees safely but slowly Standing Unsupported, Alternately Place Feet on Step/Stool: Able to complete 4 steps without aid or supervision Standing Unsupported, One Foot in Front: Able to take small step independently and hold 30 seconds Standing on One Leg: Tries to lift leg/unable to hold 3 seconds but remains standing independently Total Score: 41 Static Sitting Balance Static Sitting - Balance Support: Feet supported;Bilateral upper extremity supported Static Sitting - Level of Assistance: 7: Independent Dynamic Sitting Balance Dynamic Sitting -  Balance Support: Feet supported;During functional activity Dynamic Sitting - Level of Assistance: 7: Independent Static Standing Balance Static Standing - Balance Support: During functional activity;Bilateral upper extremity supported Static Standing - Level of Assistance: 6: Modified independent (Device/Increase time) Dynamic Standing Balance Dynamic Standing - Balance Support: During functional activity;Bilateral upper extremity supported Dynamic Standing - Level of Assistance: 6: Modified independent (Device/Increase time) Extremity Assessment  RUE Assessment RUE Assessment: Within Functional Limits General Strength Comments: Pt reports being at baseline LUE Assessment LUE Assessment: Within Functional Limits General Strength Comments: Pt reports being at baseline RLE Assessment RLE Assessment: Exceptions to [facility] General Strength Comments: Poor muscular endurance LLE Assessment LLE Assessment: Exceptions to [facility] General Strength Comments: Poor muscular endurance  Skilled Intervention- 1st Treatment Session- Patient greeted supine in bed and agreeable to PT treatment session. Patient reporting 7/10 R rib/collarbone pain this morning with functional mobility. Patient transitioned from supine to sitting EOB independently without the use of bed rail, however does require increased time to complete. Patient performed sit/stand and stand pivot transfer with RW ModI. Patient wheeled to the rehab gym for time management.   Patient ambulated x150', 2 x 170' with RW ModI. Patient transferred to/from a car simulator with RW ModI. Patient then ascended/descended a low grade ramp with RW ModI. Patient ascended/descended x16 steps with B HR ModI in order to simulate home environment.   Patient completed Berg Balance Assessment- Patient demonstrates increased fall risk as noted by score of   /56 on Berg Balance Scale.  (<36= high risk for falls, close to 100%; 37-45 significant >80%; 46-51 moderate  >50%; 52-55 lower >25%)  Patient returned to his room supine in bed with call bell within reach and all needs met.    2nd Treatment Session- Patient greeted supine in bed with NR present taking scheduled vitals and agreeable to PT treatment session. Patient reporting 5/10 pain this afternoon. Patient ambulated to/from his room and rehab gym with RW ModI.   Patient performed Nustep x10 minutes with B UE/LE on level 6. Improved endurance/activity tolerance noted with this exercise.   Patient provided a HEP in order to sustain and improve B LE strength and endurance/activity tolerance in order to ensure safety at discharge. Therapist and patient went through the HEP together with all questions answered. Educated patient  on how to scale up or downgrade exercises.   Access Code: [email or web address] URL: [email or web address] Date: 09/17/2022 Prepared by: Jodi Mourning Elizjah Noblet  Exercises - Standing March with [place] with Counter Support - Standing Hip Extension with Counter Support  - Standing Hip Abduction with Counter Support - Mini Squat with Counter Support - Sit to Stand Without Arm Support  - Seated Long Arc Quad  - Seated March  - Seated Isometric Hip Abduction with Theraband - Seated Isometric Hip Adduction with Ball    Patient stood with RW and performed alternating foot taps to 4" step with 2# ankle weights, 2 x 20 total with seated rest break in between.   Patient returned to his room supine in bed with call bell within reach and all needs met.    [place] 09/17/2022, 10:39 AM

## 2022-09-18 ENCOUNTER — Other Ambulatory Visit (HOSPITAL_COMMUNITY): Payer: Self-pay

## 2022-09-18 DIAGNOSIS — D72829 Elevated white blood cell count, unspecified: Secondary | ICD-10-CM | POA: Diagnosis not present

## 2022-09-18 DIAGNOSIS — D649 Anemia, unspecified: Secondary | ICD-10-CM | POA: Diagnosis not present

## 2022-09-18 DIAGNOSIS — I469 Cardiac arrest, cause unspecified: Secondary | ICD-10-CM | POA: Diagnosis not present

## 2022-09-18 DIAGNOSIS — I5042 Chronic combined systolic (congestive) and diastolic (congestive) heart failure: Secondary | ICD-10-CM | POA: Diagnosis not present

## 2022-09-18 DIAGNOSIS — E119 Type 2 diabetes mellitus without complications: Secondary | ICD-10-CM

## 2022-09-18 LAB — GLUCOSE, CAPILLARY
Glucose-Capillary: 141 mg/dL — ABNORMAL HIGH (ref 70–99)
Glucose-Capillary: 116 mg/dL — ABNORMAL HIGH (ref 70–99)

## 2022-09-18 MED ORDER — APIXABAN 5 MG PO TABS
5.0000 mg | ORAL_TABLET | Freq: Two times a day (BID) | ORAL | 0 refills | Status: DC
  Filled 2022-09-18: qty 60, 30d supply, fill #0

## 2022-09-18 MED ORDER — PANTOPRAZOLE SODIUM 40 MG PO TBEC
40.0000 mg | DELAYED_RELEASE_TABLET | Freq: Every day | ORAL | 0 refills | Status: DC
  Filled 2022-09-18: qty 30, 30d supply, fill #0

## 2022-09-18 MED ORDER — METOPROLOL SUCCINATE ER 50 MG PO TB24
50.0000 mg | ORAL_TABLET | Freq: Two times a day (BID) | ORAL | 0 refills | Status: DC
  Filled 2022-09-18: qty 60, 30d supply, fill #0

## 2022-09-18 MED ORDER — OXYCODONE HCL 5 MG PO TABS
5.0000 mg | ORAL_TABLET | ORAL | Status: DC | PRN
  Administered 2022-09-18: 5 mg via ORAL
  Filled 2022-09-18: qty 1

## 2022-09-18 MED ORDER — OXYCODONE-ACETAMINOPHEN 5-325 MG PO TABS
1.0000 | ORAL_TABLET | Freq: Four times a day (QID) | ORAL | 0 refills | Status: DC | PRN
  Filled 2022-09-18: qty 30, 4d supply, fill #0

## 2022-09-18 MED ORDER — AMIODARONE HCL 400 MG PO TABS: 400.0000 mg | ORAL_TABLET | Freq: Every day | ORAL | 0 refills | Status: DC

## 2022-09-18 MED ORDER — LEVOTHYROXINE SODIUM 50 MCG PO TABS
50.0000 ug | ORAL_TABLET | Freq: Every day | ORAL | 0 refills | Status: DC
  Filled 2022-09-18: qty 30, 30d supply, fill #0

## 2022-09-18 MED ORDER — FUROSEMIDE 40 MG PO TABS
40.0000 mg | ORAL_TABLET | Freq: Every day | ORAL | 0 refills | Status: DC
  Filled 2022-09-18: qty 30, 30d supply, fill #0

## 2022-09-18 MED ORDER — DAPAGLIFLOZIN PROPANEDIOL 10 MG PO TABS
10.0000 mg | ORAL_TABLET | Freq: Every day | ORAL | 0 refills | Status: DC
  Filled 2022-09-18: qty 30, 30d supply, fill #0

## 2022-09-18 MED ORDER — POTASSIUM CHLORIDE CRYS ER 10 MEQ PO TBCR
10.0000 meq | EXTENDED_RELEASE_TABLET | Freq: Every day | ORAL | 0 refills | Status: DC
  Filled 2022-09-18: qty 30, 30d supply, fill #0

## 2022-09-18 MED ORDER — CLOPIDOGREL BISULFATE 75 MG PO TABS
75.0000 mg | ORAL_TABLET | Freq: Every day | ORAL | 1 refills | Status: DC
  Filled 2022-09-18: qty 30, 30d supply, fill #0

## 2022-09-18 MED ORDER — LEVOTHYROXINE SODIUM 50 MCG PO TABS: 50.0000 ug | ORAL_TABLET | Freq: Every day | ORAL | 0 refills | Status: DC

## 2022-09-18 MED ORDER — EZETIMIBE 10 MG PO TABS
10.0000 mg | ORAL_TABLET | Freq: Every day | ORAL | 0 refills | Status: DC
  Filled 2022-09-18: qty 30, 30d supply, fill #0

## 2022-09-18 MED ORDER — SACUBITRIL-VALSARTAN 97-103 MG PO TABS
1.0000 | ORAL_TABLET | Freq: Two times a day (BID) | ORAL | 0 refills | Status: DC
  Filled 2022-09-18: qty 60, 30d supply, fill #0

## 2022-09-18 MED ORDER — ATORVASTATIN CALCIUM 80 MG PO TABS
80.0000 mg | ORAL_TABLET | Freq: Every evening | ORAL | 0 refills | Status: DC
  Filled 2022-09-18: qty 30, 30d supply, fill #0

## 2022-09-18 MED ORDER — AMIODARONE HCL 200 MG PO TABS
400.0000 mg | ORAL_TABLET | Freq: Every day | ORAL | 0 refills | Status: DC
  Filled 2022-09-18: qty 60, 30d supply, fill #0

## 2022-09-18 MED ORDER — LEVETIRACETAM 750 MG PO TABS
1500.0000 mg | ORAL_TABLET | Freq: Two times a day (BID) | ORAL | 0 refills | Status: DC
  Filled 2022-09-18: qty 120, 30d supply, fill #0

## 2022-09-18 MED ORDER — ALPRAZOLAM 0.25 MG PO TABS
0.2500 mg | ORAL_TABLET | Freq: Two times a day (BID) | ORAL | 0 refills | Status: DC | PRN
  Filled 2022-09-18: qty 20, 10d supply, fill #0

## 2022-09-18 MED ORDER — NALOXONE HCL 4 MG/0.1ML NA LIQD
NASAL | 0 refills | Status: AC
  Filled 2022-09-18: qty 2, 30d supply, fill #0

## 2022-09-18 MED ORDER — LEVETIRACETAM 750 MG PO TABS: 1500.0000 mg | ORAL_TABLET | Freq: Two times a day (BID) | ORAL | 0 refills | Status: DC

## 2022-09-18 MED ORDER — LIDOCAINE 4 % EX CREA
TOPICAL_CREAM | Freq: Three times a day (TID) | CUTANEOUS | 0 refills | Status: DC | PRN
  Filled 2022-09-18: qty 30, fill #0

## 2022-09-18 NOTE — Progress Notes (Signed)
INPATIENT REHABILITATION DISCHARGE NOTE   Discharge instructions by:PAm PA  Verbalized understanding:yes  Skin care/Wound care healing?none  Pain:given at 0808   IV's:none  Tubes/Drains:none  O2: none Safety instructions done Patient belongings:done  Discharged XI:[facility]U  Discharged via:car accompanied by NT and daughter  Glenis Smoker RNC,BSN, WTA

## 2022-09-18 NOTE — Discharge Summary (Signed)
Physician Discharge Summary  Patient ID: Gregory [name] MRN: [id number] DOB/AGE: 1961-08-16 61 y.o.  Admit date: 09/11/2022 Discharge date: 09/18/2022  Discharge Diagnoses:  Principal Problem:   Cardiac arrest [facility]) Active Problems:   Chronic combined systolic and diastolic congestive heart failure (HCC)   Debility   Atrial fibrillation (HCC)   Type 2 diabetes mellitus without complication, without long-term current use of insulin (HCC)   Seizure (HCC)   Discharged Condition: stable  Significant Diagnostic Studies: N/A   Labs:  Basic Metabolic Panel: Recent Labs  Lab 09/12/22 0536 09/15/22 0608 09/17/22 0851  NA 137 137 137  K 4.4 4.2 4.7  CL 107 105 102  CO2 '27 24 26  ' GLUCOSE 108* 194* 134*  BUN '12 14 13  ' CREATININE 0.83 0.86 1.01  CALCIUM 8.2* 8.1* 8.3*    CBC: Recent Labs  Lab 09/12/22 0536 09/15/22 0608 09/17/22 0851  WBC 12.7* 10.8* 8.1  NEUTROABS 11.0*  --   --   HGB 11.7* 11.1* 11.9*  HCT 35.3* 33.8* 37.6*  MCV 97.8 98.3 99.7  PLT 309 333 359    CBG: Recent Labs  Lab 09/17/22 1140 09/17/22 1629 09/17/22 2050 09/18/22 0548 09/18/22 1132  GLUCAP 125* 177* 137* 141* 116*    Brief HPI:   Gregory [name] is a 61 y.o. male with history of HTN, VT s/p ICD, PAF, seizure disorder seizure disorder, colon cancer with mets who was admitted on 08/30/2022 with inability to speak as well as right gaze deviation with seizure.  He required intubation, became pulseless requiring 1 round of CPR with epi and CT head showed hyperdense left MCA branch.  CTA was negative for LVO.  EEG done showing severe diffuse encephalopathy without seizure or epileptiform discharges.  CTA chest was negative for PE.  Aspiration pneumonia was treated with Unasyn and Keppra was increased to 1500 mg twice daily.    He was extubated without difficulty and right-sided chest wall pain felt to be costochondral in nature post CPR injury.  Cardiac arrest felt to be  secondary to electrolyte abnormalities and ICD download did not show episodes of sustained or nonsustained V. tach.  He was loaded with amiodarone and mexiletine was discontinued.  PT OT was working with patient who continued to be limited by chest wall pain, weakness as well as debility.  CIR was recommended due to functional decline.   Hospital Course: Andry Mayo Faulk was admitted to rehab 09/11/2022 for inpatient therapies to consist of PT and OT at least three hours five days a week. Past admission physiatrist, therapy team and rehab RN have worked together to provide customized collaborative inpatient rehab.  His blood pressures and heart rate were monitored on TID basis and has been stable. No cardiac symptoms reported with increase in activity.  He continued to have chest wall pain which has been managed with as needed use of oxycodone.  Anxiety levels were noted to be elevated therefore Xanax was increased to twice daily to 3 times daily as needed and Ambien was added to help manage insomnia during hospitalization and he is to resume his trazodone prn at  home.   He was educated on importance of remaining Xanax down to home dose as well as weaning off of oxycodone to avoid SE.  Blood sugars have been monitored with ac/hs CBG checks and SSI was use prn for tighter BS control. BS have been borderline controlled on liberalized diet to help with food choices. He was advised on importance of  CM/HH diet and is to follow up with his PCP for further recommendations.  He has made great gains and is modified independent for mobility. Intermittent supervision recommended after discharge. Patient declined follow up therapy to transition to home as well as being set up with PCP. Family will assist as needed after discharge.    Rehab course: During patient's stay in rehab weekly team conferences were held to monitor patient's progress, set goals and discuss barriers to discharge. At admission, patient  required mod assist for ADL tasks and min assist for mobility. He  has had improvement in activity tolerance, balance, postural control as well as ability to compensate for deficits. He is able to complete ADL tasks with supervision. He is independent for transfers and to ambulate 150' with RW.   Disposition: Home  Diet: Heart healthy.   Special Instructions: Medication refills per PCP/cardiology. No driving till cleared by cardiology.   Discharge Instructions     Ambulatory referral to Neurology   Complete by: As directed    An appointment is requested in approximately: 2-3 follow up      Allergies as of 09/18/2022   No Known Allergies      Medication List     STOP taking these medications    docusate sodium 100 MG capsule Commonly known as: COLACE   feeding supplement Liqd   nystatin powder Commonly known as: MYCOSTATIN/NYSTOP       TAKE these medications    acetaminophen 325 MG tablet Commonly known as: TYLENOL Take 1-2 tablets (325-650 mg total) by mouth every 4 (four) hours as needed for mild pain.   ALPRAZolam 0.25 MG tablet--Rx# 20 pills Commonly known as: XANAX Take 1 tablet (0.25 mg total) by mouth 2 (two) times daily as needed for anxiety or sleep. What changed: when to take this   amiodarone 400 MG tablet Commonly known as: PACERONE Take 1 tablet (400 mg total) by mouth daily.   atorvastatin 80 MG tablet Commonly known as: LIPITOR Take 1 tablet (80 mg total) by mouth every evening.   clopidogrel 75 MG tablet Commonly known as: PLAVIX Take 1 tablet (75 mg total) by mouth daily.   diclofenac Sodium 1 % Gel Commonly known as: VOLTAREN Apply 2 g topically 4 (four) times daily. What changed:  when to take this reasons to take this   Eliquis 5 MG Tabs tablet Generic drug: apixaban Take 1 tablet (5 mg total) by mouth 2 (two) times daily. What changed: See the new instructions.   ezetimibe 10 MG tablet Commonly known as: ZETIA Take 1  tablet (10 mg total) by mouth daily.   Farxiga 10 MG Tabs tablet Generic drug: dapagliflozin propanediol Take 1 tablet (10 mg total) by mouth daily.   furosemide 40 MG tablet Commonly known as: LASIX Take 1 tablet (40 mg total) by mouth daily.   levETIRAcetam 750 MG tablet Commonly known as: KEPPRA Take 2 tablets (1,500 mg total) by mouth 2 (two) times daily.   levothyroxine 50 MCG tablet Commonly known as: SYNTHROID Take 1 tablet (50 mcg total) by mouth daily at 6 (six) AM.   lidocaine 4 % cream Commonly known as: LMX Apply topically 3 (three) times daily as needed (Apply to nostril for NG tube irritation).   metoprolol succinate 50 MG 24 hr tablet Commonly known as: TOPROL-XL Take 1 tablet (50 mg total) by mouth 2 (two) times daily. Take with or immediately following a meal.   naloxone 4 MG/0.1ML Liqd nasal spray kit Commonly  known as: NARCAN Use as needed in case of overdose   oxyCODONE-acetaminophen 5-325 MG tablet Rx # 30 pills Commonly known as: PERCOCET/ROXICET Take 1-2 tablets by mouth every 6 (six) hours as needed for moderate pain or severe pain. What changed: how much to take   pantoprazole 40 MG tablet Commonly known as: PROTONIX Take 1 tablet (40 mg total) by mouth daily.   polyethylene glycol 17 g packet Commonly known as: MIRALAX / GLYCOLAX Take 17 g by mouth 2 (two) times daily. What changed:  when to take this reasons to take this   potassium chloride 10 MEQ tablet Commonly known as: KLOR-CON M Take 1 tablet (10 mEq total) by mouth daily.   sacubitril-valsartan 97-103 MG Commonly known as: ENTRESTO Take 1 tablet by mouth 2 (two) times daily.        Follow-up Information     Jennye Boroughs, MD Follow up.   Specialty: Physical Medicine and Rehabilitation Why: As needed Contact information: [address] [place] [address] [phone]         [name], MD Follow up.   Specialties: Hematology,  Oncology Why: Call in 1-2 days for post hospital follow up Contact information: [place] [address] [phone]         Sanda Klein, MD Follow up.   Specialty: Cardiology Why: Call in 1-2 days for post hospital follow up Contact information: [address] [place] [address] [phone]         [name], MD Follow up.   Specialty: Neurology Why: office will call you with follow up appointment Contact information: [place] [address] [phone]                 Signed: Bary Leriche 09/18/2022, 4:50 PM

## 2022-09-18 NOTE — Progress Notes (Addendum)
Inpatient Rehabilitation Discharge Medication Review by a Pharmacist  A complete drug regimen review was completed for this patient to identify any potential clinically significant medication issues.  High Risk Drug Classes Is patient taking? Indication by Medication  Antipsychotic    Anticoagulant Yes Apixaban -afib  Antibiotic No   Opioid Yes Percocet- acute pain  Antiplatelet Yes Plavix- CVA ppx  Hypoglycemics/insulin Yes Farxiga- T2DM  Vasoactive Medication Yes Amiodarone, lasix, toprol xl, entresto- HF / AF rate control  Chemotherapy No   Other Yes Alprazolam- for anxiety and sleep Synthroid- hypothyroidism Protonix- GERD Keppra- seizure ppx Zetia / Lipitor- HLD Narcan- prn in case of overdose Ativan- seizure Ambien- sleep  Potassium - supplement     Type of Medication Issue Identified Description of Issue Recommendation(s)  Drug Interaction(s) (clinically significant)     Duplicate Therapy     Allergy     No Medication Administration End Date     Incorrect Dose     Additional Drug Therapy Needed     Significant med changes from prior encounter (inform family/care partners about these prior to discharge).  PTA Xeloda, Mexilitine.  f/u with oncologist as outpatient for reinitation of Xeloda. Mexilitine was discontinued.  Patient to call  Dr. [name] (hematology/oncology) in 1-2 days for post hospital follow up and f/u for re-initiation of Xeloda.  Other       Clinically significant medication issues were identified that warrant physician communication and completion of prescribed/recommended actions by midnight of the next day:  No  Time spent performing this drug regimen review (minutes):  [place],  Clinical Pharmacist 09/18/2022 12:16 PM

## 2022-09-18 NOTE — Progress Notes (Signed)
PROGRESS NOTE   Subjective/Complaints: Seen at bedside. He is excited to go home. No new concerns.   ROS: Denies fevers, chills, HA, N/V/D/C, abdominal pain,SOB, cough, chest pain, new weakness or paraesthesias.    Objective:   No results found. Recent Labs    09/17/22 0851  WBC 8.1  HGB 11.9*  HCT 37.6*  PLT 359    Recent Labs    09/17/22 0851  NA 137  K 4.7  CL 102  CO2 26  GLUCOSE 134*  BUN 13  CREATININE 1.01  CALCIUM 8.3*     Intake/Output Summary (Last 24 hours) at 09/18/2022 0944 Last data filed at 09/18/2022 0840 Gross per 24 hour  Intake 716 ml  Output 2875 ml  Net -2159 ml          Physical Exam: Vital Signs Blood pressure 108/83, pulse 63, temperature 98.2 F (36.8 C), temperature source Oral, resp. rate 16, weight 98.1 kg, SpO2 95 %.   General: Alert and oriented x 4, No apparent distress. In bed HEENT: Head is normocephalic, atraumatic, conjugate gaze, MMM, Fatty deposits outer cantus bilaterally- chronic   Neck: Supple without JVD or lymphadenopathy Heart: Reg rate and rhythm. No murmurs rubs or gallops  Chest: CTA bilaterally without wheezes, rales, or rhonchi; no distress, normal rate Abdomen: Soft, nontender, non-distended, bowel sounds positive. Extremities: No clubbing, cyanosis, or edema. Pulses are 2+  Musculoskeletal:        Comments:antigravity all 4 extremities Skin: warm and dry, no breakdown noted Neurological:     General: No focal deficit present. Follows commands.    Mental Status: He is alert and oriented to person, place, and time.  Psychiatric:        Mood and Affect: normal affect, pleasant    Behavior: Behavior normal.    Assessment/Plan: 1. Functional deficits which require 3+ hours per day of interdisciplinary therapy in a comprehensive inpatient rehab setting. Physiatrist is providing close team supervision and 24 hour management of active medical  problems listed below. Physiatrist and rehab team continue to assess barriers to discharge/monitor patient progress toward functional and medical goals  Care Tool:  Bathing    Body parts bathed by patient: Right arm, Left arm, Chest, Abdomen, Front perineal area, Buttocks, Face, Right upper leg, Left upper leg, Left lower leg, Right lower leg   Body parts bathed by helper: Left lower leg, Right lower leg     Bathing assist Assist Level: Supervision/Verbal cueing     Upper Body Dressing/Undressing Upper body dressing   What is the patient wearing?: Pull over shirt    Upper body assist Assist Level: Set up assist    Lower Body Dressing/Undressing Lower body dressing      What is the patient wearing?: Pants     Lower body assist Assist for lower body dressing: Supervision/Verbal cueing     Toileting Toileting    Toileting assist Assist for toileting: Independent with assistive device Assistive Device Comment: urinal   Transfers Chair/bed transfer  Transfers assist     Chair/bed transfer assist level: Independent with assistive device Chair/bed transfer assistive device: Programmer, multimedia   Ambulation assist  Assist level: Independent with assistive device Assistive device: Walker-rolling Max distance: 150 feet +   Walk 10 feet activity   Assist     Assist level: Independent with assistive device Assistive device: Walker-rolling   Walk 50 feet activity   Assist    Assist level: Independent with assistive device Assistive device: Walker-rolling    Walk 150 feet activity   Assist    Assist level: Independent with assistive device Assistive device: Walker-rolling    Walk 10 feet on uneven surface  activity   Assist     Assist level: Independent with assistive device Assistive device: Walker-rolling   Wheelchair     Assist Is the patient using a wheelchair?: Yes Type of Wheelchair: Manual    Wheelchair  assist level: Independent Max wheelchair distance: 150'    Wheelchair 50 feet with 2 turns activity    Assist        Assist Level: Independent   Wheelchair 150 feet activity     Assist      Assist Level: Independent   Blood pressure 108/83, pulse 63, temperature 98.2 F (36.8 C), temperature source Oral, resp. rate 16, weight 98.1 kg, SpO2 95 %.  Medical Problem List and Plan: 1. Functional deficits secondary to PEA arrest/debility             -patient may  shower             -ELOS/Goals: 10-14 days- supervision to min A  -Continue CIR  -DC today 2.  Antithrombotics: -DVT/anticoagulation:  Pharmaceutical: Other (comment) Eliquis.              -antiplatelet therapy: Plavix.  3. Pain Management: Oxycodone prn.  4. Mood/Behavior/Sleep: LCSW to follow for evaluation and support.              --Ambien helps him stay asleep longer (better than trazodone)             -antipsychotic agents: N/A  -Xanax PRN 0.25 BID started for anxiety, appears to be home medication. Will increase xanax PRN 0.25 to TID 5. Neuropsych/cognition: This patient is capable of making decisions on his own behalf. 6. Skin/Wound Care: Routine pressure relief measures.  7. Fluids/Electrolytes/Nutrition: Monitor I/O. Check CMET in am.  8. H/o new seizures: Continue Keppra 1500 mg BID  -No recent seizure activity, continue current 9. CAD/ICM/chronic combined CHF: Monitor for signs of overload. Daily weights --Continue Lipitor, Farxiga, Lasix, Entresto, atorvastatin and Plavix.   Filed Weights   09/15/22 0515 09/16/22 0442 09/17/22 0500  Weight: 93 kg 90.8 kg 98.1 kg               - no s/s fluid overload, likely unreliable weights; monitor  -Weight a little downtrending, continue to monitor, CMP tomorrow  -Weights appear inaccurate, no signs of overload  -8/5 no signs of volume overload 10. V tach s/p AICD: On amiodarone and Metoprolol. Me 11. A fib: Monitor HR TID. On eliquis, amiodarone and  metoprolol  -10/4 HR continues to be well controlled 12.  Rib fractures: Encourage pulmonary hygeine.   -Continue 3x lidocaine patches 13. Colon Ca w/mets: Xeloda to resume after discharge.  14. DM type 2  -Continue farxiga, SSI            - 10/5 well controlled, continue current 15. Anemia  -HGB stable at 11.9 10/4 16. Leukocytosis-mild  -10/5 8.1 yesterday 17. Moderate malnutrition with low albumin  - will order dietician consult, although appears to be eating better  LOS: 7 days A FACE TO FACE EVALUATION WAS PERFORMED  Jennye Boroughs 09/18/2022, 9:44 AM

## 2022-09-18 NOTE — Discharge Instructions (Addendum)
Inpatient Rehab Discharge Instructions  Gregory [name] Discharge date and time: 09/18/22   Activities/Precautions/ Functional Status: Activity: no lifting, driving, or strenuous exercise till cleared by MD. No driving for 6 months or till cleared by cardiology Diet: cardiac diet/Carb Modified for pre-diabetes Wound Care: none needed   Functional status:  ___ No restrictions     ___ Walk up steps independently ___ 24/7 supervision/assistance   ___ Walk up steps with assistance _X__ Intermittent supervision/assistance  ___ Bathe/dress independently ___ Walk with walker     ___ Bathe/dress with assistance ___ Walk Independently    ___ Shower independently ___ Walk with assistance    _X__ Shower with assistance __X_ No alcohol     ___ Return to work/school ________  Special Instructions:  No home health or outpatient set up as you have declined these services.   My questions have been answered and I understand these instructions. I will adhere to these goals and the provided educational materials after my discharge from the hospital.  Patient/Caregiver Signature _______________________________ Date __________  Clinician Signature _______________________________________ Date __________  Please bring this form and your medication list with you to all your follow-up doctor's appointments.

## 2022-09-19 ENCOUNTER — Inpatient Hospital Stay: Payer: Commercial Managed Care - HMO | Admitting: Hematology

## 2022-09-19 ENCOUNTER — Inpatient Hospital Stay: Payer: Commercial Managed Care - HMO | Attending: Hematology

## 2022-09-19 ENCOUNTER — Encounter: Payer: Self-pay | Admitting: Hematology

## 2022-09-19 ENCOUNTER — Inpatient Hospital Stay: Payer: Commercial Managed Care - HMO

## 2022-09-19 ENCOUNTER — Other Ambulatory Visit (HOSPITAL_COMMUNITY): Payer: Self-pay

## 2022-09-19 ENCOUNTER — Inpatient Hospital Stay (HOSPITAL_BASED_OUTPATIENT_CLINIC_OR_DEPARTMENT_OTHER): Payer: Commercial Managed Care - HMO | Admitting: Hematology

## 2022-09-19 VITALS — BP 124/89 | HR 72 | Temp 97.6°F | Resp 18 | Wt 202.0 lb

## 2022-09-19 DIAGNOSIS — Z79890 Hormone replacement therapy (postmenopausal): Secondary | ICD-10-CM | POA: Insufficient documentation

## 2022-09-19 DIAGNOSIS — I469 Cardiac arrest, cause unspecified: Secondary | ICD-10-CM | POA: Insufficient documentation

## 2022-09-19 DIAGNOSIS — K648 Other hemorrhoids: Secondary | ICD-10-CM | POA: Insufficient documentation

## 2022-09-19 DIAGNOSIS — U071 COVID-19: Secondary | ICD-10-CM | POA: Diagnosis not present

## 2022-09-19 DIAGNOSIS — C182 Malignant neoplasm of ascending colon: Secondary | ICD-10-CM | POA: Insufficient documentation

## 2022-09-19 DIAGNOSIS — C7889 Secondary malignant neoplasm of other digestive organs: Secondary | ICD-10-CM | POA: Insufficient documentation

## 2022-09-19 DIAGNOSIS — Z7901 Long term (current) use of anticoagulants: Secondary | ICD-10-CM | POA: Insufficient documentation

## 2022-09-19 DIAGNOSIS — Z79899 Other long term (current) drug therapy: Secondary | ICD-10-CM | POA: Insufficient documentation

## 2022-09-19 DIAGNOSIS — I5042 Chronic combined systolic (congestive) and diastolic (congestive) heart failure: Secondary | ICD-10-CM | POA: Insufficient documentation

## 2022-09-19 DIAGNOSIS — K922 Gastrointestinal hemorrhage, unspecified: Secondary | ICD-10-CM | POA: Insufficient documentation

## 2022-09-19 DIAGNOSIS — R5383 Other fatigue: Secondary | ICD-10-CM | POA: Insufficient documentation

## 2022-09-19 DIAGNOSIS — I110 Hypertensive heart disease with heart failure: Secondary | ICD-10-CM | POA: Insufficient documentation

## 2022-09-19 DIAGNOSIS — J90 Pleural effusion, not elsewhere classified: Secondary | ICD-10-CM | POA: Insufficient documentation

## 2022-09-19 DIAGNOSIS — C787 Secondary malignant neoplasm of liver and intrahepatic bile duct: Secondary | ICD-10-CM | POA: Insufficient documentation

## 2022-09-19 DIAGNOSIS — D500 Iron deficiency anemia secondary to blood loss (chronic): Secondary | ICD-10-CM | POA: Insufficient documentation

## 2022-09-19 DIAGNOSIS — D509 Iron deficiency anemia, unspecified: Secondary | ICD-10-CM

## 2022-09-19 DIAGNOSIS — J9601 Acute respiratory failure with hypoxia: Secondary | ICD-10-CM | POA: Diagnosis not present

## 2022-09-19 DIAGNOSIS — Z7902 Long term (current) use of antithrombotics/antiplatelets: Secondary | ICD-10-CM | POA: Insufficient documentation

## 2022-09-19 DIAGNOSIS — R569 Unspecified convulsions: Secondary | ICD-10-CM | POA: Insufficient documentation

## 2022-09-19 DIAGNOSIS — C7970 Secondary malignant neoplasm of unspecified adrenal gland: Secondary | ICD-10-CM | POA: Insufficient documentation

## 2022-09-19 DIAGNOSIS — I700 Atherosclerosis of aorta: Secondary | ICD-10-CM | POA: Insufficient documentation

## 2022-09-19 LAB — COMPREHENSIVE METABOLIC PANEL
ALT: 54 U/L — ABNORMAL HIGH (ref 0–44)
AST: 32 U/L (ref 15–41)
Albumin: 3.4 g/dL — ABNORMAL LOW (ref 3.5–5.0)
Alkaline Phosphatase: 224 U/L — ABNORMAL HIGH (ref 38–126)
Anion gap: 5 (ref 5–15)
BUN: 16 mg/dL (ref 8–23)
CO2: 28 mmol/L (ref 22–32)
Calcium: 8.6 mg/dL — ABNORMAL LOW (ref 8.9–10.3)
Chloride: 105 mmol/L (ref 98–111)
Creatinine, Ser: 0.93 mg/dL (ref 0.61–1.24)
GFR, Estimated: >60 mL/min (ref >60)
Glucose, Bld: 136 mg/dL — ABNORMAL HIGH (ref 70–99)
Potassium: 4.5 mmol/L (ref 3.5–5.1)
Sodium: 138 mmol/L (ref 135–145)
Total Bilirubin: 0.4 mg/dL (ref 0.3–1.2)
Total Protein: 7.9 g/dL (ref 6.5–8.1)

## 2022-09-19 LAB — IRON AND IRON BINDING CAPACITY (CC-WL,HP ONLY)
Iron: 37 ug/dL — ABNORMAL LOW (ref 45–182)
Saturation Ratios: 12 % — ABNORMAL LOW (ref 17.9–39.5)
TIBC: 308 ug/dL (ref 250–450)
UIBC: 271 ug/dL (ref 117–376)

## 2022-09-19 LAB — CBC WITH DIFFERENTIAL/PLATELET
Abs Immature Granulocytes: 0.02 10*3/uL (ref 0.00–0.07)
Basophils Absolute: 0 10*3/uL (ref 0.0–0.1)
Basophils Relative: 1 %
Eosinophils Absolute: 0.1 10*3/uL (ref 0.0–0.5)
Eosinophils Relative: 1 %
HCT: 40.4 % (ref 39.0–52.0)
Hemoglobin: 13 g/dL (ref 13.0–17.0)
Immature Granulocytes: 0 %
Lymphocytes Relative: 11 %
Lymphs Abs: 0.8 10*3/uL (ref 0.7–4.0)
MCH: 31.8 pg (ref 26.0–34.0)
MCHC: 32.2 g/dL (ref 30.0–36.0)
MCV: 98.8 fL (ref 80.0–100.0)
Monocytes Absolute: 0.5 10*3/uL (ref 0.1–1.0)
Monocytes Relative: 7 %
Neutro Abs: 5.8 10*3/uL (ref 1.7–7.7)
Neutrophils Relative %: 80 %
Platelets: 335 10*3/uL (ref 150–400)
RBC: 4.09 MIL/uL — ABNORMAL LOW (ref 4.22–5.81)
RDW: 21.5 % — ABNORMAL HIGH (ref 11.5–15.5)
WBC: 7.3 10*3/uL (ref 4.0–10.5)
nRBC: 0 % (ref 0.0–0.2)

## 2022-09-19 LAB — FERRITIN: Ferritin: 186 ng/mL (ref 24–336)

## 2022-09-19 LAB — CEA (IN HOUSE-CHCC): CEA (CHCC-In House): 5.17 ng/mL — ABNORMAL HIGH (ref 0.00–5.00)

## 2022-09-19 MED ORDER — CAPECITABINE 500 MG PO TABS
ORAL_TABLET | ORAL | 1 refills | Status: DC
  Filled 2022-09-19: qty 112, 21d supply, fill #0
  Filled 2022-10-14: qty 112, 21d supply, fill #1

## 2022-09-19 NOTE — Progress Notes (Signed)
[place]   [phone] [phone]   Clinic Follow up Note   Patient Care Team: Patient, No Pcp Per as PCP - General (General Practice) Croitoru, Dani Gobble, MD as PCP - Cardiology (Cardiology) [name], MD as PCP - Electrophysiology (Cardiology) [name], MD as Consulting Physician (Neurology) [name], MD as Consulting Physician (Hematology) Stechschulte, Nickola Major, MD as Consulting Physician (Surgery)  Date of Service:  09/19/2022  CHIEF COMPLAINT: f/u of colon cancer  CURRENT THERAPY:  Xeloda, q21d, on 07/21/22             -dose: 2026m BID on days 1-14  ASSESSMENT & PLAN:  Abdulhadi WLabrandon Knochis a 61y.o. male with   1. 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 FOBT test positive. Colonoscopy on 04/16/22 with Dr. AHavery Morosconfirmed adenocarcinoma. -s/p resection on 04/18/22 by Dr. SThermon Leyland path showed: 6.4 cm moderate to poorly differentiated adenocarcinoma; 11/35 positive nodes; lymphovascular invasion present; margins negative. MMR normal. Of note, appendix was uninvolved.  -he developed intra-abdominal abscess, which required draining placement on 05/09/22. CT AP 07/16/22 showed near resolution and draining tube was removed. -he started adjuvant Xeloda on 07/21/22. He tolerated well with fatigue and worsening skin peeling in feet. He notes skin dryness was present prior to starting treatment. -PET scan 07/24/22 showed: numerous hepatic metastatic lesions, two small hypermetabolic upper abdominal lymph nodes; no metastatic disease in chest or bony structures. 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 -He is quite deconditioned due to recent  hospital stay.  We will postpone next cycle Xeloda to October 16   2. Recent Hospitalization 9/16 - 10/5 -presented with seizure, experienced stroke, cardiac arrest, and aspiration pneumonia in the hospital. -he is slowly recovering, ambulating with walker but shaky on his feet.   3. Anemia from GI bleeding and iron deficiency -secondary to #1 -he received IV Feraheme and blood transfusion during hospitalization -anemia resolved prior to hospitalization at visit on 08/29/22. Hgb today (09/19/22) has recovered to 13.   4. CHF and HTN   -continue medications and f/u with cardiology   PLAN: -Lab reviewed.  We will postpone next cycle Xeloda to October 16, he will continue at the same dose -Lab and follow-up in 4 weeks, will order restaging scan on next visit.   No problem-specific Assessment & Plan notes found for this encounter.   SUMMARY OF ONCOLOGIC HISTORY: Oncology History Overview Note   Cancer Staging  Cancer of right colon ([facility] Staging form: Colon and Rectum, AJCC 8th Edition - Pathologic stage from 04/22/2022: pT3, pN2b, cM1 - Signed by FTruitt Merle MD on 05/05/2022    [place] ([place]  04/12/2022 Miscellaneous   Fecal Occult Bld - POSITIVE   04/16/2022 Imaging   EXAM: CT CHEST, ABDOMEN, AND PELVIS WITH CONTRAST  IMPRESSION: 1. 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 2. Bilateral adrenal nodules which are indeterminate, but potentially metastatic. 3. Multiple hypovascular lesions scattered throughout the pancreas. The possibility of  metastatic disease to the pancreas should be considered. Alternatively, benign etiologies such as chronic pancreatic pseudocysts  could have a similar appearance (no recent prior studies are available for comparison). Close attention on follow-up studies is recommended to ensure the stability or regression of these pancreatic lesions. 4. Small to moderate left pleural effusion lying dependently with some passive subsegmental atelectasis in the left lower lobe. 5. Cardiomegaly with left atrial dilatation. 6. Aortic atherosclerosis, in addition to left main and three-vessel coronary artery disease. Please note that although the presence of coronary artery calcium documents the presence of coronary artery disease, the severity of this disease and any potential stenosis cannot be assessed on this non-gated CT examination. Assessment for potential risk factor modification, dietary therapy or pharmacologic therapy may be warranted, if clinically indicated. 7. There are calcifications of the aortic valve. Echocardiographic correlation for evaluation of potential valvular dysfunction may be warranted if clinically indicated. 8. Additional incidental findings, as above.   04/16/2022 Procedure   Colonoscopy; Dr. Havery Moros  Impression: -Stool in the entire examined colon, several minutes spent lavaging the colon - Likely malignant partially obstructing tumor in the suspected proximal transverse colon vs. right colon as above - could not traverse it. Biopsied. Tattooed. - Five 3 to 10 mm polyps in the transverse colon, removed with a cold snare. Resected and retrieved. - Two 6 to 8 mm polyps in the transverse colon, removed with a hot snare. Resected and retrieved. - One 6 mm polyp in the descending colon, removed with a cold snare. Resected and retrieved. - Two 3 to 4 mm polyps in the sigmoid colon, removed with a cold snare. Resected and retrieved. - One 15 mm polyp in the sigmoid colon, removed with a hot snare. Resected and retrieved. - One 3 to 4 mm polyp at the recto-sigmoid colon, removed with a cold snare. Resected and  retrieved. - Internal hemorrhoids. - The examination was otherwise normal.  Colon mass is likely malignant and the cause of anemia / bleeding.   04/16/2022 Initial Biopsy   FINAL MICROSCOPIC DIAGNOSIS:   A. COLON MASS, BIOPSY:  - Moderately differentiated colonic adenocarcinoma.  - See comment.   B. COLON, TRANSVERSE, DESCENDING AND SIGMOID, POLYPECTOMY:  - Tubular adenoma (s) without high grade dysplasia.  - Sessile serrated adenoma(s) without cytologic dysplasia.   C. COLON, SIGMOID, POLYPECTOMY:  - Tubular adenoma without high grade dysplasia.    04/17/2022 Initial Diagnosis   Cancer of right colon ([place])   04/18/2022 Definitive Surgery   FINAL MICROSCOPIC DIAGNOSIS:   A. COLON, RIGHT, RESECTION:  -  Adenocarcinoma, moderate to poorly differentiated, 6.4 cm  -  Metastatic adenocarcinoma involving eleven of thirty-five lymph nodes (11/35)  -  Lymphovascular space invasion present  -  Margins uninvolved by carcinoma  -  Multiple tubular adenomas  -  Appendix uninvolved by adenocarcinoma  -  See oncology table and comment below   ADDENDUM:  Mismatch Repair Protein (IHC)  SUMMARY INTERPRETATION: NORMAL   04/22/2022 Cancer Staging   Staging form: Colon and Rectum, AJCC 8th Edition - Pathologic stage from 04/22/2022: pT3, pN2b, cM1 - Signed by [name], MD on 05/05/2022 Stage prefix: Initial diagnosis Histologic grading system: 4 grade system Histologic grade (G): G3 Residual tumor (R): R0 - None      INTERVAL HISTORY:  [facility] is here for a follow up of colon cancer. He was last seen by me on 08/29/22. He presents to the clinic accompanied by his son. He tells me  he does not remember the day he went to the hospital. His son explains he had a seizure, pneumonia, heart attack, and a stroke. He reports he feels weaker and is shaky when he walks.   All other systems were reviewed with the patient and are negative.  MEDICAL HISTORY:  Past Medical History:   Diagnosis Date   AICD (automatic cardioverter/defibrillator) present 2005   CAD (coronary artery disease) 12/01/2013   Chronic combined systolic and diastolic CHF, NYHA class 1 ([place]) 12/01/2013   Erectile dysfunction 12/01/2013   HTN (hypertension) 12/01/2013   Hyperlipidemia 12/01/2013   Ischemic cardiomyopathy 12/01/2013   Presence of permanent cardiac pacemaker    Sleep apnea     SURGICAL HISTORY: Past Surgical History:  Procedure Laterality Date   ACHILLES TENDON REPAIR     lft foot   BIOPSY  04/16/2022   Procedure: BIOPSY;  Surgeon: Yetta Flock, MD;  Location: [place];  Service: Gastroenterology;;   CARDIAC CATHETERIZATION N/A 05/05/2016   Procedure: Left Heart Cath and Coronary Angiography;  Surgeon: Leonie Man, MD;  Location: [place] CV LAB;  Service: Cardiovascular;  Laterality: N/A;   CARDIAC DEFIBRILLATOR PLACEMENT     CARDIOVERSION N/A 09/13/2020   Procedure: CARDIOVERSION;  Surgeon: Werner Lean, MD;  Location: [place] ENDOSCOPY;  Service: Cardiovascular;  Laterality: N/A;   CARDIOVERSION N/A 09/19/2020   Procedure: CARDIOVERSION;  Surgeon: [name], MD;  Location: [facility] ENDOSCOPY;  Service: Cardiovascular;  Laterality: N/A;   COLON RESECTION N/A 04/18/2022   Procedure: HAND ASSISTED LAPAROSCOPIC RIGHT COLON RESECTION;  Surgeon: Felicie Morn, MD;  Location: [place];  Service: General;  Laterality: N/A;   COLONOSCOPY WITH PROPOFOL N/A 04/16/2022   Procedure: COLONOSCOPY WITH PROPOFOL;  Surgeon: Yetta Flock, MD;  Location: [place];  Service: Gastroenterology;  Laterality: N/A;   EP IMPLANTABLE DEVICE N/A 12/19/2016   Procedure: ICD Generator Changeout;  Surgeon: [name], MD;  Location: [place] CV LAB;  Service: Cardiovascular;  Laterality: N/A;   ESOPHAGOGASTRODUODENOSCOPY N/A 04/16/2022   Procedure: ESOPHAGOGASTRODUODENOSCOPY (EGD);  Surgeon: Yetta Flock, MD;  Location: [facility] ENDOSCOPY;  Service: Gastroenterology;   Laterality: N/A;   IR ANGIOGRAM SELECTIVE EACH ADDITIONAL VESSEL  05/09/2022   IR ANGIOGRAM VISCERAL SELECTIVE  05/09/2022   IR CATHETER TUBE CHANGE  05/14/2022   IR CATHETER TUBE CHANGE  06/27/2022   IR EMBO ART  VEN HEMORR LYMPH EXTRAV  INC GUIDE ROADMAPPING  05/09/2022   IR IMAGE GUIDED DRAINAGE PERCUT CATH  PERITONEAL RETROPERIT  05/09/2022   IR PATIENT EVAL TECH 0-60 MINS  06/11/2022   IR RADIOLOGIST EVAL & MGMT  06/26/2022   IR RADIOLOGIST EVAL & MGMT  07/16/2022   IR US GUIDE VASC ACCESS RIGHT  05/09/2022   LEFT HEART CATH AND CORONARY ANGIOGRAPHY N/A 04/17/2022   Procedure: LEFT HEART CATH AND CORONARY ANGIOGRAPHY;  Surgeon: Belva Crome, MD;  Location: [place] CV LAB;  Service: Cardiovascular;  Laterality: N/A;   POLYPECTOMY  04/16/2022   Procedure: POLYPECTOMY;  Surgeon: Yetta Flock, MD;  Location: [facility] ENDOSCOPY;  Service: Gastroenterology;;   RIGHT/LEFT HEART CATH AND CORONARY ANGIOGRAPHY N/A 09/24/2020   Procedure: RIGHT/LEFT HEART CATH AND CORONARY ANGIOGRAPHY;  Surgeon: Lorretta Harp, MD;  Location: [place] CV LAB;  Service: Cardiovascular;  Laterality: N/A;   SUBMUCOSAL TATTOO INJECTION  04/16/2022   Procedure: SUBMUCOSAL TATTOO INJECTION;  Surgeon: Yetta Flock, MD;  Location: [facility] ENDOSCOPY;  Service: Gastroenterology;;   WRIST TENODESIS  I have reviewed the social history and family history with the patient and they are unchanged from previous note.  ALLERGIES:  has No Known Allergies.  MEDICATIONS:  Current Outpatient Medications  Medication Sig Dispense Refill   capecitabine (XELODA) 500 MG tablet Take 4 tabs every 12 hours for 14 days then off for 7 days, take after a meal 112 tablet 1   acetaminophen (TYLENOL) 325 MG tablet Take 1-2 tablets (325-650 mg total) by mouth every 4 (four) hours as needed for mild pain.     ALPRAZolam (XANAX) 0.25 MG tablet Take 1 tablet (0.25 mg total) by mouth 2 (two) times daily as needed for anxiety or sleep. 20 tablet  0   amiodarone (PACERONE) 200 MG tablet Take 2 tablets (400 mg total) by mouth daily. 60 tablet 0   apixaban (ELIQUIS) 5 MG TABS tablet Take 1 tablet (5 mg total) by mouth 2 (two) times daily. 60 tablet 0   atorvastatin (LIPITOR) 80 MG tablet Take 1 tablet (80 mg total) by mouth every evening. 30 tablet 0   clopidogrel (PLAVIX) 75 MG tablet Take 1 tablet (75 mg total) by mouth daily. 30 tablet 1   dapagliflozin propanediol (FARXIGA) 10 MG TABS tablet Take 1 tablet (10 mg total) by mouth daily. 30 tablet 0   diclofenac Sodium (VOLTAREN) 1 % GEL Apply 2 g topically 4 (four) times daily. (Patient taking differently: Apply 2 g topically daily as needed (For pain).)     ezetimibe (ZETIA) 10 MG tablet Take 1 tablet (10 mg total) by mouth daily. 30 tablet 0   furosemide (LASIX) 40 MG tablet Take 1 tablet (40 mg total) by mouth daily. 30 tablet 0   levETIRAcetam (KEPPRA) 750 MG tablet Take 2 tablets (1,500 mg total) by mouth 2 (two) times daily. 120 tablet 0   levothyroxine (SYNTHROID) 50 MCG tablet Take 1 tablet (50 mcg total) by mouth daily at 6 (six) AM. 30 tablet 0   lidocaine (LMX) 4 % cream Apply topically 3 (three) times daily as needed (Apply to nostril for NG tube irritation). 30 g 0   metoprolol succinate (TOPROL-XL) 50 MG 24 hr tablet Take 1 tablet (50 mg total) by mouth 2 (two) times daily. Take with or immediately following a meal. 60 tablet 0   naloxone (NARCAN) nasal spray 4 mg/0.1 mL Use as needed in case of overdose 2 each 0   oxyCODONE-acetaminophen (PERCOCET/ROXICET) 5-325 MG tablet Take 1-2 tablets by mouth every 6 (six) hours as needed for moderate pain or severe pain. 30 tablet 0   pantoprazole (PROTONIX) 40 MG tablet Take 1 tablet (40 mg total) by mouth daily. 30 tablet 0   polyethylene glycol (MIRALAX / GLYCOLAX) 17 g packet Take 17 g by mouth 2 (two) times daily. (Patient taking differently: Take 17 g by mouth daily as needed for mild constipation.) 14 each 0   potassium chloride  (KLOR-CON M) 10 MEQ tablet Take 1 tablet (10 mEq total) by mouth daily. 30 tablet 0   sacubitril-valsartan (ENTRESTO) 97-103 MG Take 1 tablet by mouth 2 (two) times daily. 60 tablet 0   No current facility-administered medications for this visit.    PHYSICAL EXAMINATION: ECOG PERFORMANCE STATUS: 3 - Symptomatic, >50% confined to bed  Vitals:   09/19/22 0934  BP: 124/89  Pulse: 72  Resp: 18  Temp: 97.6 F (36.4 C)  SpO2: 99%   Wt Readings from Last 3 Encounters:  09/19/22 202 lb (91.6 kg)  09/17/22 216 lb 4.3  oz (98.1 kg)  09/10/22 213 lb 3 oz (96.7 kg)     GENERAL:alert, no distress and comfortable SKIN: skin color, texture, turgor are normal, no rashes or significant lesions EYES: normal, Conjunctiva are pink and non-injected, sclera clear  LUNGS: clear to auscultation and percussion with normal breathing effort HEART: regular rate & rhythm and no murmurs and no lower extremity edema Musculoskeletal:no cyanosis of digits and no clubbing  NEURO: alert & oriented x 3 with fluent speech, no focal motor/sensory deficits  LABORATORY DATA:  I have reviewed the data as listed    Latest Ref Rng & Units 09/19/2022    9:08 AM 09/17/2022    8:51 AM 09/15/2022    6:08 AM  CBC  WBC 4.0 - 10.5 K/uL 7.3  8.1  10.8   Hemoglobin 13.0 - 17.0 g/dL 13.0  11.9  11.1   Hematocrit 39.0 - 52.0 % 40.4  37.6  33.8   Platelets 150 - 400 K/uL 335  359  333         Latest Ref Rng & Units 09/19/2022    9:08 AM 09/17/2022    8:51 AM 09/15/2022    6:08 AM  CMP  Glucose 70 - 99 mg/dL 136  134  194   BUN 8 - 23 mg/dL '16  13  14   ' Creatinine 0.61 - 1.24 mg/dL 0.93  1.01  0.86   Sodium 135 - 145 mmol/L 138  137  137   Potassium 3.5 - 5.1 mmol/L 4.5  4.7  4.2   Chloride 98 - 111 mmol/L 105  102  105   CO2 22 - 32 mmol/L '28  26  24   ' Calcium 8.9 - 10.3 mg/dL 8.6  8.3  8.1   Total Protein 6.5 - 8.1 g/dL 7.9  6.9    Total Bilirubin 0.3 - 1.2 mg/dL 0.4  0.1    Alkaline Phos 38 - 126 U/L 224  197     AST 15 - 41 U/L 32  52    ALT 0 - 44 U/L 54  62        RADIOGRAPHIC STUDIES: I have personally reviewed the radiological images as listed and agreed with the findings in the report. No results found.    No orders of the defined types were placed in this encounter.  All questions were answered. The patient knows to call the clinic with any problems, questions or concerns. No barriers to learning was detected. The total time spent in the appointment was 30 minutes.     [name], MD 09/19/2022   I, Wilburn Mylar, am acting as scribe for [name], MD.   I have reviewed the above documentation for accuracy and completeness, and I agree with the above.

## 2022-09-22 ENCOUNTER — Encounter (HOSPITAL_COMMUNITY): Payer: Self-pay

## 2022-09-22 ENCOUNTER — Emergency Department (HOSPITAL_COMMUNITY): Payer: Commercial Managed Care - HMO

## 2022-09-22 ENCOUNTER — Inpatient Hospital Stay (HOSPITAL_COMMUNITY)
Admission: EM | Admit: 2022-09-22 | Discharge: 2022-09-26 | DRG: 177 | Disposition: A | Discharge status: Home / Self Care | Payer: Commercial Managed Care - HMO | Attending: Internal Medicine | Admitting: Internal Medicine

## 2022-09-22 ENCOUNTER — Other Ambulatory Visit: Payer: Self-pay

## 2022-09-22 ENCOUNTER — Encounter: Payer: Commercial Managed Care - HMO | Admitting: Internal Medicine

## 2022-09-22 DIAGNOSIS — G4733 Obstructive sleep apnea (adult) (pediatric): Secondary | ICD-10-CM

## 2022-09-22 DIAGNOSIS — B952 Enterococcus as the cause of diseases classified elsewhere: Secondary | ICD-10-CM | POA: Diagnosis present

## 2022-09-22 DIAGNOSIS — Z9861 Coronary angioplasty status: Secondary | ICD-10-CM | POA: Diagnosis not present

## 2022-09-22 DIAGNOSIS — R569 Unspecified convulsions: Secondary | ICD-10-CM | POA: Diagnosis not present

## 2022-09-22 DIAGNOSIS — Z79890 Hormone replacement therapy (postmenopausal): Secondary | ICD-10-CM

## 2022-09-22 DIAGNOSIS — G40909 Epilepsy, unspecified, not intractable, without status epilepticus: Secondary | ICD-10-CM | POA: Diagnosis present

## 2022-09-22 DIAGNOSIS — Z808 Family history of malignant neoplasm of other organs or systems: Secondary | ICD-10-CM

## 2022-09-22 DIAGNOSIS — N390 Urinary tract infection, site not specified: Secondary | ICD-10-CM | POA: Diagnosis present

## 2022-09-22 DIAGNOSIS — Z79899 Other long term (current) drug therapy: Secondary | ICD-10-CM

## 2022-09-22 DIAGNOSIS — I447 Left bundle-branch block, unspecified: Secondary | ICD-10-CM | POA: Diagnosis present

## 2022-09-22 DIAGNOSIS — R1900 Intra-abdominal and pelvic swelling, mass and lump, unspecified site: Secondary | ICD-10-CM | POA: Diagnosis not present

## 2022-09-22 DIAGNOSIS — Z7984 Long term (current) use of oral hypoglycemic drugs: Secondary | ICD-10-CM

## 2022-09-22 DIAGNOSIS — J9601 Acute respiratory failure with hypoxia: Secondary | ICD-10-CM

## 2022-09-22 DIAGNOSIS — Z95810 Presence of automatic (implantable) cardiac defibrillator: Secondary | ICD-10-CM

## 2022-09-22 DIAGNOSIS — I10 Essential (primary) hypertension: Secondary | ICD-10-CM | POA: Diagnosis present

## 2022-09-22 DIAGNOSIS — R7401 Elevation of levels of liver transaminase levels: Secondary | ICD-10-CM | POA: Diagnosis present

## 2022-09-22 DIAGNOSIS — Z8249 Family history of ischemic heart disease and other diseases of the circulatory system: Secondary | ICD-10-CM

## 2022-09-22 DIAGNOSIS — I480 Paroxysmal atrial fibrillation: Secondary | ICD-10-CM | POA: Diagnosis present

## 2022-09-22 DIAGNOSIS — Z9049 Acquired absence of other specified parts of digestive tract: Secondary | ICD-10-CM | POA: Diagnosis not present

## 2022-09-22 DIAGNOSIS — E785 Hyperlipidemia, unspecified: Secondary | ICD-10-CM | POA: Diagnosis present

## 2022-09-22 DIAGNOSIS — Z7901 Long term (current) use of anticoagulants: Secondary | ICD-10-CM

## 2022-09-22 DIAGNOSIS — J90 Pleural effusion, not elsewhere classified: Secondary | ICD-10-CM | POA: Diagnosis present

## 2022-09-22 DIAGNOSIS — I110 Hypertensive heart disease with heart failure: Secondary | ICD-10-CM | POA: Diagnosis present

## 2022-09-22 DIAGNOSIS — C182 Malignant neoplasm of ascending colon: Secondary | ICD-10-CM | POA: Diagnosis present

## 2022-09-22 DIAGNOSIS — E039 Hypothyroidism, unspecified: Secondary | ICD-10-CM | POA: Diagnosis not present

## 2022-09-22 DIAGNOSIS — J189 Pneumonia, unspecified organism: Secondary | ICD-10-CM

## 2022-09-22 DIAGNOSIS — Z87891 Personal history of nicotine dependence: Secondary | ICD-10-CM | POA: Diagnosis not present

## 2022-09-22 DIAGNOSIS — U071 COVID-19: Principal | ICD-10-CM | POA: Diagnosis present

## 2022-09-22 DIAGNOSIS — I255 Ischemic cardiomyopathy: Secondary | ICD-10-CM | POA: Diagnosis present

## 2022-09-22 DIAGNOSIS — J1282 Pneumonia due to coronavirus disease 2019: Secondary | ICD-10-CM | POA: Diagnosis present

## 2022-09-22 DIAGNOSIS — C787 Secondary malignant neoplasm of liver and intrahepatic bile duct: Secondary | ICD-10-CM | POA: Diagnosis present

## 2022-09-22 DIAGNOSIS — I5022 Chronic systolic (congestive) heart failure: Secondary | ICD-10-CM

## 2022-09-22 DIAGNOSIS — Z66 Do not resuscitate: Secondary | ICD-10-CM | POA: Diagnosis present

## 2022-09-22 DIAGNOSIS — I2510 Atherosclerotic heart disease of native coronary artery without angina pectoris: Secondary | ICD-10-CM | POA: Diagnosis not present

## 2022-09-22 DIAGNOSIS — I5042 Chronic combined systolic (congestive) and diastolic (congestive) heart failure: Secondary | ICD-10-CM | POA: Diagnosis not present

## 2022-09-22 DIAGNOSIS — E119 Type 2 diabetes mellitus without complications: Secondary | ICD-10-CM | POA: Diagnosis present

## 2022-09-22 DIAGNOSIS — I4891 Unspecified atrial fibrillation: Secondary | ICD-10-CM | POA: Diagnosis present

## 2022-09-22 DIAGNOSIS — Z7902 Long term (current) use of antithrombotics/antiplatelets: Secondary | ICD-10-CM

## 2022-09-22 HISTORY — DX: Cardiac arrest, cause unspecified: I46.9

## 2022-09-22 LAB — I-STAT CHEM 8, ED
BUN: 18 mg/dL (ref 8–23)
Calcium, Ion: 1.07 mmol/L — ABNORMAL LOW (ref 1.15–1.40)
Chloride: 99 mmol/L (ref 98–111)
Creatinine, Ser: 1 mg/dL (ref 0.61–1.24)
Glucose, Bld: 104 mg/dL — ABNORMAL HIGH (ref 70–99)
HCT: 47 % (ref 39.0–52.0)
Hemoglobin: 16 g/dL (ref 13.0–17.0)
Potassium: 4.1 mmol/L (ref 3.5–5.1)
Sodium: 136 mmol/L (ref 135–145)
TCO2: 31 mmol/L (ref 22–32)

## 2022-09-22 LAB — CBC WITH DIFFERENTIAL/PLATELET
Abs Immature Granulocytes: 0.02 10*3/uL (ref 0.00–0.07)
Basophils Absolute: 0 10*3/uL (ref 0.0–0.1)
Basophils Relative: 0 %
Eosinophils Absolute: 0 10*3/uL (ref 0.0–0.5)
Eosinophils Relative: 0 %
HCT: 43.4 % (ref 39.0–52.0)
Hemoglobin: 14.1 g/dL (ref 13.0–17.0)
Immature Granulocytes: 0 %
Lymphocytes Relative: 6 %
Lymphs Abs: 0.4 10*3/uL — ABNORMAL LOW (ref 0.7–4.0)
MCH: 31.8 pg (ref 26.0–34.0)
MCHC: 32.5 g/dL (ref 30.0–36.0)
MCV: 97.7 fL (ref 80.0–100.0)
Monocytes Absolute: 0.5 10*3/uL (ref 0.1–1.0)
Monocytes Relative: 9 %
Neutro Abs: 4.9 10*3/uL (ref 1.7–7.7)
Neutrophils Relative %: 85 %
Platelets: 318 10*3/uL (ref 150–400)
RBC: 4.44 MIL/uL (ref 4.22–5.81)
RDW: 20.5 % — ABNORMAL HIGH (ref 11.5–15.5)
WBC: 5.8 10*3/uL (ref 4.0–10.5)
nRBC: 0 % (ref 0.0–0.2)

## 2022-09-22 LAB — I-STAT VENOUS BLOOD GAS, ED
Acid-Base Excess: 4 mmol/L — ABNORMAL HIGH (ref 0.0–2.0)
Bicarbonate: 28.8 mmol/L — ABNORMAL HIGH (ref 20.0–28.0)
Calcium, Ion: 1.07 mmol/L — ABNORMAL LOW (ref 1.15–1.40)
HCT: 45 % (ref 39.0–52.0)
Hemoglobin: 15.3 g/dL (ref 13.0–17.0)
O2 Saturation: 30 %
Potassium: 4.1 mmol/L (ref 3.5–5.1)
Sodium: 136 mmol/L (ref 135–145)
TCO2: 30 mmol/L (ref 22–32)
pCO2, Ven: 42.8 mmHg — ABNORMAL LOW (ref 44–60)
pH, Ven: 7.436 — ABNORMAL HIGH (ref 7.25–7.43)
pO2, Ven: 19 mmHg — CL (ref 32–45)

## 2022-09-22 LAB — COMPREHENSIVE METABOLIC PANEL
ALT: 61 U/L — ABNORMAL HIGH (ref 0–44)
AST: 56 U/L — ABNORMAL HIGH (ref 15–41)
Albumin: 3.3 g/dL — ABNORMAL LOW (ref 3.5–5.0)
Alkaline Phosphatase: 208 U/L — ABNORMAL HIGH (ref 38–126)
Anion gap: 11 (ref 5–15)
BUN: 13 mg/dL (ref 8–23)
CO2: 26 mmol/L (ref 22–32)
Calcium: 8.6 mg/dL — ABNORMAL LOW (ref 8.9–10.3)
Chloride: 99 mmol/L (ref 98–111)
Creatinine, Ser: 1.15 mg/dL (ref 0.61–1.24)
GFR, Estimated: >60 mL/min (ref >60)
Glucose, Bld: 108 mg/dL — ABNORMAL HIGH (ref 70–99)
Potassium: 4 mmol/L (ref 3.5–5.1)
Sodium: 136 mmol/L (ref 135–145)
Total Bilirubin: 0.5 mg/dL (ref 0.3–1.2)
Total Protein: 8.3 g/dL — ABNORMAL HIGH (ref 6.5–8.1)

## 2022-09-22 LAB — URINALYSIS, ROUTINE W REFLEX MICROSCOPIC
Bacteria, UA: NONE SEEN
Bilirubin Urine: NEGATIVE
Glucose, UA: >=500 mg/dL — AB
Hgb urine dipstick: NEGATIVE
Ketones, ur: NEGATIVE mg/dL
Leukocytes,Ua: NEGATIVE
Nitrite: NEGATIVE
Protein, ur: NEGATIVE mg/dL
Specific Gravity, Urine: 1.011 (ref 1.005–1.030)
pH: 6 (ref 5.0–8.0)

## 2022-09-22 LAB — CK: Total CK: 147 U/L (ref 49–397)

## 2022-09-22 LAB — C-REACTIVE PROTEIN: CRP: 3.9 mg/dL — ABNORMAL HIGH (ref <1.0)

## 2022-09-22 LAB — TROPONIN I (HIGH SENSITIVITY)
Troponin I (High Sensitivity): 31 ng/L — ABNORMAL HIGH (ref <18)
Troponin I (High Sensitivity): 27 ng/L — ABNORMAL HIGH (ref <18)

## 2022-09-22 LAB — RESP PANEL BY RT-PCR (FLU A&B, COVID) ARPGX2
Influenza A by PCR: NEGATIVE
Influenza B by PCR: NEGATIVE
SARS Coronavirus 2 by RT PCR: POSITIVE — AB

## 2022-09-22 LAB — PROTIME-INR
INR: 1.5 — ABNORMAL HIGH (ref 0.8–1.2)
Prothrombin Time: 17.6 seconds — ABNORMAL HIGH (ref 11.4–15.2)

## 2022-09-22 LAB — BRAIN NATRIURETIC PEPTIDE: B Natriuretic Peptide: 300.3 pg/mL — ABNORMAL HIGH (ref 0.0–100.0)

## 2022-09-22 LAB — LACTIC ACID, PLASMA: Lactic Acid, Venous: 1.6 mmol/L (ref 0.5–1.9)

## 2022-09-22 LAB — MAGNESIUM: Magnesium: 2.2 mg/dL (ref 1.7–2.4)

## 2022-09-22 LAB — D-DIMER, QUANTITATIVE: D-Dimer, Quant: 4.83 ug/mL-FEU — ABNORMAL HIGH (ref 0.00–0.50)

## 2022-09-22 MED ORDER — DEXAMETHASONE 4 MG PO TABS
6.0000 mg | ORAL_TABLET | Freq: Once | ORAL | Status: AC
  Administered 2022-09-22: 6 mg via ORAL
  Filled 2022-09-22: qty 2

## 2022-09-22 MED ORDER — METRONIDAZOLE 500 MG/100ML IV SOLN
500.0000 mg | Freq: Two times a day (BID) | INTRAVENOUS | Status: DC
  Administered 2022-09-23: 500 mg via INTRAVENOUS
  Filled 2022-09-22: qty 100

## 2022-09-22 MED ORDER — AMIODARONE HCL 200 MG PO TABS
400.0000 mg | ORAL_TABLET | Freq: Every day | ORAL | Status: DC
  Administered 2022-09-23 – 2022-09-26 (×4): 400 mg via ORAL
  Filled 2022-09-22 (×4): qty 2

## 2022-09-22 MED ORDER — IBUPROFEN 400 MG PO TABS
600.0000 mg | ORAL_TABLET | Freq: Once | ORAL | Status: AC
  Administered 2022-09-22: 600 mg via ORAL
  Filled 2022-09-22: qty 1

## 2022-09-22 MED ORDER — DAPAGLIFLOZIN PROPANEDIOL 10 MG PO TABS
10.0000 mg | ORAL_TABLET | Freq: Every day | ORAL | Status: DC
  Administered 2022-09-23 – 2022-09-26 (×4): 10 mg via ORAL
  Filled 2022-09-22 (×4): qty 1

## 2022-09-22 MED ORDER — VANCOMYCIN HCL IN DEXTROSE 1-5 GM/200ML-% IV SOLN: 1000.0000 mg | Freq: Once | INTRAVENOUS | Status: DC

## 2022-09-22 MED ORDER — VANCOMYCIN HCL IN DEXTROSE 1-5 GM/200ML-% IV SOLN: 1000.0000 mg | Freq: Three times a day (TID) | INTRAVENOUS | Status: DC

## 2022-09-22 MED ORDER — SODIUM CHLORIDE 0.9 % IV SOLN
200.0000 mg | Freq: Once | INTRAVENOUS | Status: AC
  Administered 2022-09-23: 200 mg via INTRAVENOUS
  Filled 2022-09-22: qty 40

## 2022-09-22 MED ORDER — METOPROLOL SUCCINATE ER 50 MG PO TB24
50.0000 mg | ORAL_TABLET | Freq: Two times a day (BID) | ORAL | Status: DC
  Administered 2022-09-23 (×2): 50 mg via ORAL
  Filled 2022-09-22: qty 2
  Filled 2022-09-22 (×2): qty 1

## 2022-09-22 MED ORDER — ATORVASTATIN CALCIUM 80 MG PO TABS
80.0000 mg | ORAL_TABLET | Freq: Every evening | ORAL | Status: DC
  Administered 2022-09-23: 80 mg via ORAL
  Filled 2022-09-22: qty 1

## 2022-09-22 MED ORDER — LEVOTHYROXINE SODIUM 50 MCG PO TABS
50.0000 ug | ORAL_TABLET | Freq: Every day | ORAL | Status: DC
  Administered 2022-09-23 – 2022-09-26 (×4): 50 ug via ORAL
  Filled 2022-09-22 (×2): qty 1
  Filled 2022-09-22: qty 2
  Filled 2022-09-22: qty 1

## 2022-09-22 MED ORDER — SACUBITRIL-VALSARTAN 97-103 MG PO TABS
1.0000 | ORAL_TABLET | Freq: Two times a day (BID) | ORAL | Status: DC
  Filled 2022-09-22 (×4): qty 1

## 2022-09-22 MED ORDER — DICLOFENAC SODIUM 1 % EX GEL
2.0000 g | Freq: Four times a day (QID) | CUTANEOUS | Status: DC
  Administered 2022-09-23 – 2022-09-25 (×7): 2 g via TOPICAL
  Filled 2022-09-22 (×2): qty 100

## 2022-09-22 MED ORDER — LACTATED RINGERS IV BOLUS (SEPSIS)
1000.0000 mL | Freq: Once | INTRAVENOUS | Status: AC
  Administered 2022-09-22: 1000 mL via INTRAVENOUS

## 2022-09-22 MED ORDER — POLYETHYLENE GLYCOL 3350 17 G PO PACK
17.0000 g | PACK | Freq: Every day | ORAL | Status: DC | PRN
  Filled 2022-09-22: qty 1

## 2022-09-22 MED ORDER — SODIUM CHLORIDE 0.9 % IV SOLN
100.0000 mg | Freq: Every day | INTRAVENOUS | Status: AC
  Administered 2022-09-24: 100 mg via INTRAVENOUS
  Filled 2022-09-22 (×2): qty 20

## 2022-09-22 MED ORDER — IOHEXOL 350 MG/ML SOLN
75.0000 mL | Freq: Once | INTRAVENOUS | Status: AC | PRN
  Administered 2022-09-22: 75 mL via INTRAVENOUS

## 2022-09-22 MED ORDER — ACETAMINOPHEN 325 MG PO TABS
650.0000 mg | ORAL_TABLET | Freq: Once | ORAL | Status: AC
  Administered 2022-09-22: 650 mg via ORAL
  Filled 2022-09-22: qty 2

## 2022-09-22 MED ORDER — FUROSEMIDE 40 MG PO TABS
40.0000 mg | ORAL_TABLET | Freq: Every day | ORAL | Status: DC
  Administered 2022-09-23: 40 mg via ORAL
  Filled 2022-09-22: qty 2
  Filled 2022-09-22: qty 1

## 2022-09-22 MED ORDER — SODIUM CHLORIDE 0.9 % IV SOLN
2.0000 g | Freq: Three times a day (TID) | INTRAVENOUS | Status: DC
  Administered 2022-09-23: 2 g via INTRAVENOUS
  Filled 2022-09-22: qty 12.5

## 2022-09-22 MED ORDER — GUAIFENESIN-DM 100-10 MG/5ML PO SYRP
10.0000 mL | ORAL_SOLUTION | ORAL | Status: DC | PRN
  Filled 2022-09-22: qty 10

## 2022-09-22 MED ORDER — SODIUM CHLORIDE 0.9% FLUSH
3.0000 mL | Freq: Two times a day (BID) | INTRAVENOUS | Status: DC
  Administered 2022-09-23 – 2022-09-25 (×4): 3 mL via INTRAVENOUS

## 2022-09-22 MED ORDER — PANTOPRAZOLE SODIUM 40 MG PO TBEC
40.0000 mg | DELAYED_RELEASE_TABLET | Freq: Every day | ORAL | Status: DC
  Administered 2022-09-23 – 2022-09-26 (×4): 40 mg via ORAL
  Filled 2022-09-22 (×4): qty 1

## 2022-09-22 MED ORDER — LEVETIRACETAM 500 MG PO TABS
1500.0000 mg | ORAL_TABLET | Freq: Two times a day (BID) | ORAL | Status: DC
  Administered 2022-09-23 – 2022-09-26 (×7): 1500 mg via ORAL
  Filled 2022-09-22 (×7): qty 3

## 2022-09-22 MED ORDER — ALPRAZOLAM 0.25 MG PO TABS
0.2500 mg | ORAL_TABLET | Freq: Two times a day (BID) | ORAL | Status: DC | PRN
  Administered 2022-09-23 – 2022-09-26 (×6): 0.25 mg via ORAL
  Filled 2022-09-22 (×6): qty 1

## 2022-09-22 MED ORDER — SODIUM CHLORIDE 0.9 % IV SOLN
750.0000 mg | Freq: Once | INTRAVENOUS | Status: AC
  Administered 2022-09-22: 750 mg via INTRAVENOUS
  Filled 2022-09-22: qty 7.5

## 2022-09-22 MED ORDER — METRONIDAZOLE 500 MG/100ML IV SOLN
500.0000 mg | Freq: Once | INTRAVENOUS | Status: AC
  Administered 2022-09-22: 500 mg via INTRAVENOUS
  Filled 2022-09-22: qty 100

## 2022-09-22 MED ORDER — ACETAMINOPHEN 325 MG PO TABS
650.0000 mg | ORAL_TABLET | Freq: Four times a day (QID) | ORAL | Status: DC | PRN
  Administered 2022-09-25: 650 mg via ORAL
  Filled 2022-09-22: qty 2

## 2022-09-22 MED ORDER — VANCOMYCIN HCL 2000 MG/400ML IV SOLN
2000.0000 mg | Freq: Once | INTRAVENOUS | Status: AC
  Administered 2022-09-22: 2000 mg via INTRAVENOUS
  Filled 2022-09-22: qty 400

## 2022-09-22 MED ORDER — LACTATED RINGERS IV SOLN: INTRAVENOUS | Status: DC

## 2022-09-22 MED ORDER — MOLNUPIRAVIR EUA 200MG CAPSULE: 4.0000 | ORAL_CAPSULE | Freq: Two times a day (BID) | ORAL | Status: DC

## 2022-09-22 MED ORDER — SODIUM CHLORIDE 0.9 % IV SOLN
2.0000 g | Freq: Once | INTRAVENOUS | Status: AC
  Administered 2022-09-22: 2 g via INTRAVENOUS
  Filled 2022-09-22: qty 12.5

## 2022-09-22 MED ORDER — OXYCODONE-ACETAMINOPHEN 5-325 MG PO TABS
1.0000 | ORAL_TABLET | Freq: Four times a day (QID) | ORAL | Status: DC | PRN
  Administered 2022-09-23 (×2): 1 via ORAL
  Administered 2022-09-24 (×2): 2 via ORAL
  Administered 2022-09-25 – 2022-09-26 (×3): 1 via ORAL
  Filled 2022-09-22 (×5): qty 1
  Filled 2022-09-22 (×2): qty 2

## 2022-09-22 NOTE — H&P (Signed)
History and Physical    Gregory [name] ZOX:[id number] DOB: March 08, 1961 DOA: 09/22/2022  PCP: Patient, No Pcp Per   Patient coming from: Home   Chief Complaint: Weakness   HPI: Gregory [name] is a pleasant 61 y.o. male with medical history significant for chronic combined systolic and diastolic CHF, atrial fibrillation on Eliquis, coronary artery disease, seizure disorder, metastatic colon cancer, and recent admission for cardiac arrest who presents to the emergency department with profound weakness.  Patient was discharged from inpatient rehab on 09/18/2022, was able to perform his ADLs at that time, but today was profoundly weak to the point where he cannot stand or perform ADLs.  He reports a mild nonproductive cough, denies chest pain, denies abdominal pain, and denies nausea, vomiting, or diarrhea.  ED Course: Upon arrival to the ED, patient is found to be febrile to 39.8 C and saturating low to mid 90s on room air with stable blood pressure.  EKG features sinus rhythm with first-degree AV nodal block and LBBB.  CTA chest is negative for significant PE but notable for large left pleural effusion and patchy infiltrates bilaterally.  CT abdomen/pelvis notable for complex cystic masses in the left upper quadrant which have significantly enlarged from the prior study.  Chemistry panel with mild elevation in transaminases and alkaline phosphatase mild and flat troponin elevation, BNP of 300, and positive COVID-19 PCR.    Blood and urine cultures were collected and the patient was given a liter of LR, vancomycin, cefepime, Flagyl, acetaminophen, Advil, Decadron, and Keppra in the ED.  Review of Systems:  All other systems reviewed and apart from HPI, are negative.  Past Medical History:  Diagnosis Date   AICD (automatic cardioverter/defibrillator) present 2005   CAD (coronary artery disease) 12/01/2013   Cardiac arrest (HCC)    Chronic combined systolic and diastolic CHF,  NYHA class 1 (HCC) 12/01/2013   Erectile dysfunction 12/01/2013   HTN (hypertension) 12/01/2013   Hyperlipidemia 12/01/2013   Ischemic cardiomyopathy 12/01/2013   Presence of permanent cardiac pacemaker    Sleep apnea     Past Surgical History:  Procedure Laterality Date   ACHILLES TENDON REPAIR     lft foot   BIOPSY  04/16/2022   Procedure: BIOPSY;  Surgeon: Benancio Deeds, MD;  Location: [facility] ENDOSCOPY;  Service: Gastroenterology;;   CARDIAC CATHETERIZATION N/A 05/05/2016   Procedure: Left Heart Cath and Coronary Angiography;  Surgeon: Marykay Lex, MD;  [facility]VASIVE CV LAB;  Service: Cardiovascular;  Laterality: N/A;   CARDIAC DEFIBRILLATOR PLACEMENT     CARDIOVERSION N/A 09/13/2020   Procedure: CARDIOVERSION;  Surgeon: Christell Constant, MD;  Location: [facility] ENDOSCOPY;  Service: Cardiovascular;  Laterality: N/A;   CARDIOVERSION N/A 09/19/2020   Procedure: CARDIOVERSION;  Surgeon: Duke Salvia, MD;  Location: [facility] ENDOSCOPY;  Service: Cardiovascular;  Laterality: N/A;   COLON RESECTION N/A 04/18/2022   Procedure: HAND ASSISTED LAPAROSCOPIC RIGHT COLON RESECTION;  Surgeon: Quentin Ore, MD;  Location: [facility] OR;  Service: General;  Laterality: N/A;   COLONOSCOPY WITH PROPOFOL N/A 04/16/2022   Procedure: COLONOSCOPY WITH PROPOFOL;  Surgeon: Benancio Deeds, MD;  Location: [facility] ENDOSCOPY;  Service: Gastroenterology;  Laterality: N/A;   EP IMPLANTABLE DEVICE N/A 12/19/2016   Procedure: ICD Generator Changeout;  Surgeon: Duke Salvia, MD;  Location: [facility] INVASIVE CV LAB;  Service: Cardiovascular;  Laterality: N/A;   ESOPHAGOGASTRODUODENOSCOPY N/A 04/16/2022   Procedure: ESOPHAGOGASTRODUODENOSCOPY (EGD);  Surgeon: Benancio Deeds, MD;  Location: [facility] ENDOSCOPY;  Service: Gastroenterology;  Laterality: N/A;   IR ANGIOGRAM SELECTIVE EACH ADDITIONAL VESSEL  05/09/2022   IR ANGIOGRAM VISCERAL SELECTIVE  05/09/2022   IR CATHETER TUBE CHANGE  05/14/2022   IR CATHETER TUBE CHANGE   06/27/2022   IR EMBO ART  VEN HEMORR LYMPH EXTRAV  INC GUIDE ROADMAPPING  05/09/2022   IR IMAGE GUIDED DRAINAGE PERCUT CATH  PERITONEAL RETROPERIT  05/09/2022   IR PATIENT EVAL TECH 0-60 MINS  06/11/2022   IR RADIOLOGIST EVAL & MGMT  06/26/2022   IR RADIOLOGIST EVAL & MGMT  07/16/2022   IR US GUIDE VASC ACCESS RIGHT  05/09/2022   LEFT HEART CATH AND CORONARY ANGIOGRAPHY N/A 04/17/2022   Procedure: LEFT HEART CATH AND CORONARY ANGIOGRAPHY;  Surgeon: Lyn Records, MD;  Location: [facility] INVASIVE CV LAB;  Service: Cardiovascular;  Laterality: N/A;   POLYPECTOMY  04/16/2022   Procedure: POLYPECTOMY;  Surgeon: Benancio Deeds, MD;  Location: [facility] ENDOSCOPY;  Service: Gastroenterology;;   RIGHT/LEFT HEART CATH AND CORONARY ANGIOGRAPHY N/A 09/24/2020   Procedure: RIGHT/LEFT HEART CATH AND CORONARY ANGIOGRAPHY;  Surgeon: Runell Gess, MD;  Location: [facility] INVASIVE CV LAB;  Service: Cardiovascular;  Laterality: N/A;   SUBMUCOSAL TATTOO INJECTION  04/16/2022   Procedure: SUBMUCOSAL TATTOO INJECTION;  Surgeon: Benancio Deeds, MD;  Location: [facility] ENDOSCOPY;  Service: Gastroenterology;;   WRIST TENODESIS      Social History:   reports that he quit smoking about 9 years ago. His smoking use included cigarettes. He has a 30.00 pack-year smoking history. He has never used smokeless tobacco. He reports that he does not currently use alcohol. He reports that he does not use drugs.  No Known Allergies  Family History  Problem Relation Age of Onset   Heart disease Mother    Brain cancer Father    Hypertension Daughter      Prior to Admission medications   Medication Sig Start Date End Date Taking? Authorizing Provider  acetaminophen (TYLENOL) 325 MG tablet Take 1-2 tablets (325-650 mg total) by mouth every 4 (four) hours as needed for mild pain. 06/06/22   Setzer, Lynnell Jude, PA-C  ALPRAZolam Prudy Feeler) 0.25 MG tablet Take 1 tablet (0.25 mg total) by mouth 2 (two) times daily as needed for anxiety or sleep. 09/18/22    Love, Evlyn Kanner, PA-C  amiodarone (PACERONE) 200 MG tablet Take 2 tablets (400 mg total) by mouth daily. 09/18/22   Love, Evlyn Kanner, PA-C  apixaban (ELIQUIS) 5 MG TABS tablet Take 1 tablet (5 mg total) by mouth 2 (two) times daily. 09/18/22   Love, Evlyn Kanner, PA-C  atorvastatin (LIPITOR) 80 MG tablet Take 1 tablet (80 mg total) by mouth every evening. 09/18/22   Love, Evlyn Kanner, PA-C  capecitabine (XELODA) 500 MG tablet Take 4 tabs every 12 hours for 14 days then off for 7 days, take after a meal 09/19/22   Malachy Mood, MD  clopidogrel (PLAVIX) 75 MG tablet Take 1 tablet (75 mg total) by mouth daily. 09/18/22   Love, Evlyn Kanner, PA-C  dapagliflozin propanediol (FARXIGA) 10 MG TABS tablet Take 1 tablet (10 mg total) by mouth daily. 09/18/22   Love, Evlyn Kanner, PA-C  diclofenac Sodium (VOLTAREN) 1 % GEL Apply 2 g topically 4 (four) times daily. Patient taking differently: Apply 2 g topically daily as needed (For pain). 06/06/22   Setzer, Lynnell Jude, PA-C  ezetimibe (ZETIA) 10 MG tablet Take 1 tablet (10 mg total) by mouth daily. 09/18/22 12/17/22  Jacquelynn Cree, PA-C  furosemide (LASIX) 40 MG tablet Take 1 tablet (40 mg total) by mouth daily. 09/18/22   Love, Evlyn Kanner, PA-C  levETIRAcetam (KEPPRA) 750 MG tablet Take 2 tablets (1,500 mg total) by mouth 2 (two) times daily. 09/18/22   Love, Evlyn Kanner, PA-C  levothyroxine (SYNTHROID) 50 MCG tablet Take 1 tablet (50 mcg total) by mouth daily at 6 (six) AM. 09/18/22   Love, Evlyn Kanner, PA-C  lidocaine (LMX) 4 % cream Apply topically 3 (three) times daily as needed (Apply to nostril for NG tube irritation). 09/18/22   Love, Evlyn Kanner, PA-C  metoprolol succinate (TOPROL-XL) 50 MG 24 hr tablet Take 1 tablet (50 mg total) by mouth 2 (two) times daily. Take with or immediately following a meal. 09/18/22   Love, Evlyn Kanner, PA-C  naloxone [facility]) nasal spray 4 mg/0.1 mL Use as needed in case of overdose 09/18/22   Love, Evlyn Kanner, PA-C  oxyCODONE-acetaminophen (PERCOCET/ROXICET) 5-325 MG  tablet Take 1-2 tablets by mouth every 6 (six) hours as needed for moderate pain or severe pain. 09/18/22   Love, Evlyn Kanner, PA-C  pantoprazole (PROTONIX) 40 MG tablet Take 1 tablet (40 mg total) by mouth daily. 09/18/22   Love, Evlyn Kanner, PA-C  polyethylene glycol (MIRALAX / GLYCOLAX) 17 g packet Take 17 g by mouth 2 (two) times daily. Patient taking differently: Take 17 g by mouth daily as needed for mild constipation. 06/06/22   Setzer, Lynnell Jude, PA-C  potassium chloride (KLOR-CON M) 10 MEQ tablet Take 1 tablet (10 mEq total) by mouth daily. 09/18/22   Love, Evlyn Kanner, PA-C  sacubitril-valsartan (ENTRESTO) 97-103 MG Take 1 tablet by mouth 2 (two) times daily. 09/18/22   Jacquelynn Cree, PA-C    Physical Exam: Vitals:   09/22/22 1845 09/22/22 2030 09/22/22 2214 09/22/22 2230  BP: 111/76 118/82  120/86  Pulse: 78 72  68  Resp: 14 19  (!) 23  Temp:   100 F (37.8 C)   TempSrc:   Oral   SpO2: 92% 95%  94%  Weight:      Height:        Constitutional: NAD, calm  Eyes: PERTLA, lids and conjunctivae normal ENMT: Mucous membranes are moist. Posterior pharynx clear of any exudate or lesions.   Neck: supple, no masses  Respiratory: no wheezing, no crackles. No accessory muscle use.  Cardiovascular: S1 & S2 heard, regular rate and rhythm. No extremity edema.  Abdomen: No distension, no tenderness, soft. Bowel sounds active.  Musculoskeletal: no clubbing / cyanosis. No joint deformity upper and lower extremities.   Skin: no significant rashes, lesions, ulcers. Warm, dry, well-perfused. Neurologic: CN 2-12 grossly intact. Moving all extremities. Alert and oriented.  Psychiatric: Pleasant. Cooperative.    Labs and Imaging on Admission: I have personally reviewed following labs and imaging studies  CBC: Recent Labs  Lab 09/17/22 0851 09/19/22 0908 09/22/22 1720 09/22/22 1732 09/22/22 1733  WBC 8.1 7.3 5.8  --   --   NEUTROABS  --  5.8 4.9  --   --   HGB 11.9* 13.0 14.1 15.3 16.0  HCT 37.6*  40.4 43.4 45.0 47.0  MCV 99.7 98.8 97.7  --   --   PLT 359 335 318  --   --    Basic Metabolic Panel: Recent Labs  Lab 09/17/22 0851 09/19/22 0908 09/22/22 1720 09/22/22 1732 09/22/22 1733  NA 137 138 136 136 136  K 4.7 4.5 4.0 4.1 4.1  CL 102 105 99  --  99  CO2 26 28 26   --   --   GLUCOSE 134* 136* 108*  --  104*  BUN 13 16 13   --  18  CREATININE 1.01 0.93 1.15  --  1.00  CALCIUM 8.3* 8.6* 8.6*  --   --   MG  --   --  2.2  --   --    GFR: Estimated Creatinine Clearance: 92.2 mL/min (by C-G formula based on SCr of 1 mg/dL). Liver Function Tests: Recent Labs  Lab 09/17/22 0851 09/19/22 0908 09/22/22 1720  AST 52* 32 56*  ALT 62* 54* 61*  ALKPHOS 197* 224* 208*  BILITOT 0.1* 0.4 0.5  PROT 6.9 7.9 8.3*  ALBUMIN 2.7* 3.4* 3.3*   No results for input(s): "LIPASE", "AMYLASE" in the last 168 hours. No results for input(s): "AMMONIA" in the last 168 hours. Coagulation Profile: Recent Labs  Lab 09/22/22 1720  INR 1.5*   Cardiac Enzymes: Recent Labs  Lab 09/22/22 1720  CKTOTAL 147   BNP (last 3 results) No results for input(s): "PROBNP" in the last 8760 hours. HbA1C: No results for input(s): "HGBA1C" in the last 72 hours. CBG: Recent Labs  Lab 09/17/22 1140 09/17/22 1629 09/17/22 2050 09/18/22 0548 09/18/22 1132  GLUCAP 125* 177* 137* 141* 116*   Lipid Profile: No results for input(s): "CHOL", "HDL", "LDLCALC", "TRIG", "CHOLHDL", "LDLDIRECT" in the last 72 hours. Thyroid Function Tests: No results for input(s): "TSH", "T4TOTAL", "FREET4", "T3FREE", "THYROIDAB" in the last 72 hours. Anemia Panel: No results for input(s): "VITAMINB12", "FOLATE", "FERRITIN", "TIBC", "IRON", "RETICCTPCT" in the last 72 hours. Urine analysis:    Component Value Date/Time   COLORURINE YELLOW 09/22/2022 1850   APPEARANCEUR CLEAR 09/22/2022 1850   LABSPEC 1.011 09/22/2022 1850   PHURINE 6.0 09/22/2022 1850   GLUCOSEU >=500 (A) 09/22/2022 1850   HGBUR NEGATIVE 09/22/2022  1850   BILIRUBINUR NEGATIVE 09/22/2022 1850   KETONESUR NEGATIVE 09/22/2022 1850   PROTEINUR NEGATIVE 09/22/2022 1850   NITRITE NEGATIVE 09/22/2022 1850   LEUKOCYTESUR NEGATIVE 09/22/2022 1850   Sepsis Labs: @LABRCNTIP (procalcitonin:4,lacticidven:4) ) Recent Results (from the past 240 hour(s))  Resp Panel by RT-PCR (Flu A&B, Covid) Anterior Nasal Swab     Status: Abnormal   Collection Time: 09/22/22  5:18 PM   Specimen: Anterior Nasal Swab  Result Value Ref Range Status   SARS Coronavirus 2 by RT PCR POSITIVE (A) NEGATIVE Final    Comment: (NOTE) SARS-CoV-2 target nucleic acids are DETECTED.  The SARS-CoV-2 RNA is generally detectable in upper respiratory specimens during the acute phase of infection. Positive results are indicative of the presence of the identified virus, but do not rule out bacterial infection or co-infection with other pathogens not detected by the test. Clinical correlation with patient history and other diagnostic information is necessary to determine patient infection status. The expected result is Negative.  Fact Sheet for Patients: BloggerCourse.com  Fact Sheet for Healthcare Providers: SeriousBroker.it  This test is not yet approved or cleared by the [place] FDA and  has been authorized for detection and/or diagnosis of SARS-CoV-2 by FDA under an Emergency Use Authorization (EUA).  This EUA will remain in effect (meaning this test can be used) for the duration of  the COVID-19 declaration under Section 564(b)(1) of the A ct, 21 U.S.C. section 360bbb-3(b)(1), unless the authorization is terminated or revoked sooner.     Influenza A by PCR NEGATIVE NEGATIVE Final   Influenza B by PCR NEGATIVE NEGATIVE Final    Comment: (NOTE)  The Xpert Xpress SARS-CoV-2/FLU/RSV plus assay is intended as an aid in the diagnosis of influenza from Nasopharyngeal swab specimens and should not be used as a sole  basis for treatment. Nasal washings and aspirates are unacceptable for Xpert Xpress SARS-CoV-2/FLU/RSV testing.  Fact Sheet for Patients: BloggerCourse.com  Fact Sheet for Healthcare Providers: SeriousBroker.it  This test is not yet approved or cleared by the [place] FDA and has been authorized for detection and/or diagnosis of SARS-CoV-2 by FDA under an Emergency Use Authorization (EUA). This EUA will remain in effect (meaning this test can be used) for the duration of the COVID-19 declaration under Section 564(b)(1) of the Act, 21 U.S.C. section 360bbb-3(b)(1), unless the authorization is terminated or revoked.  Performed at [facility], [address] [place], [place] [address]      Radiological Exams on Admission: CT Angio Chest PE W and/or Wo Contrast  Result Date: 09/22/2022 CLINICAL DATA:  Pulmonary embolus suspected with high probability. Progressive weakness since prior discharge on Thursday for cardiac arrest. Sepsis. Abdominal pain. EXAM: CT ANGIOGRAPHY CHEST CT ABDOMEN AND PELVIS WITH CONTRAST TECHNIQUE: Multidetector CT imaging of the chest was performed using the standard protocol during bolus administration of intravenous contrast. Multiplanar CT image reconstructions and MIPs were obtained to evaluate the vascular anatomy. Multidetector CT imaging of the abdomen and pelvis was performed using the standard protocol during bolus administration of intravenous contrast. RADIATION DOSE REDUCTION: This exam was performed according to the departmental dose-optimization program which includes automated exposure control, adjustment of the mA and/or kV according to patient size and/or use of iterative reconstruction technique. CONTRAST:  75mL OMNIPAQUE IOHEXOL 350 MG/ML SOLN COMPARISON:  CT chest 08/30/2022.  PET-CT 07/24/2022 FINDINGS: CTA CHEST FINDINGS Cardiovascular: Good opacification of the central and segmental  pulmonary arteries. No focal filling defects. No evidence of significant pulmonary embolus. Cardiac enlargement. No pericardial effusions. Normal caliber thoracic aorta. Calcification of the aorta and coronary arteries. Mediastinum/Nodes: Thyroid gland is unremarkable. Esophagus is decompressed. No significant lymphadenopathy. Cardiac pacemaker. Lungs/Pleura: Large left pleural effusion. Atelectasis in both lung bases, greater on the left. Patchy infiltrates in the left upper lung and less prominent infiltrates in the right upper lung. Changes likely to represent multifocal pneumonia. Aspiration or edema would be less likely. Airways are patent. No pneumothorax. Musculoskeletal: Degenerative changes in the spine. No destructive bone lesions. Review of the MIP images confirms the above findings. CT ABDOMEN and PELVIS FINDINGS Hepatobiliary: Scattered subcentimeter low-attenuation lesions in the liver. These are similar to previous study. Likely metastatic. Stone in the gallbladder. No bile duct dilatation. Pancreas: Pancreatic parenchyma appears to be intact. There are multiloculated thick-walled heterogeneous cystic lesions demonstrated in the left upper quadrant, surrounding the tail of the pancreas and extending around the spleen into the subdiaphragmatic space. The largest of these measures about 7 x 9.3 cm. These are increasing since the prior study and likely represent progressing metastatic disease although abscesses could also have this appearance. Spleen: Spleen is otherwise normal. Surgical clips in the left upper quadrant anterior to the spleen. Adrenals/Urinary Tract: Bilateral adrenal gland nodules are unchanged. 1.7 cm on the right and 1.5 cm on the left. Likely metastatic. Small bilateral renal cysts. Largest on the left measures 2.4 cm diameter. No imaging follow-up is indicated. Stomach/Bowel: Stomach, small bowel, and colon are not abnormally distended. Previous right hemicolectomy resection with  ileocolonic anastomosis appearing patent. Vascular/Lymphatic: Calcification of the aorta. No aneurysm. Scattered lymph nodes are not pathologically enlarged. Reproductive: Prostate is unremarkable.  Other: Small amount of free fluid in the abdomen and pelvis along the pericolic gutters. No free air. Abdominal wall musculature appears intact. Musculoskeletal: Degenerative changes.  No destructive bone lesions. Review of the MIP images confirms the above findings. IMPRESSION: 1. No evidence of significant pulmonary embolus. 2. Large left pleural effusion. Bilateral basilar atelectasis. Patchy infiltrates in the lungs, greater on the left. Changes likely due to multifocal pneumonia although edema or aspiration would be secondary considerations. 3. Presumed metastatic lesions in the liver and adrenal glands are unchanged. 4. Heterogeneous complex cystic masses demonstrated in the left upper quadrant, around the spleen, and around the tail of the pancreas have significantly enlarged since previous study, likely metastatic although abscesses could also have this appearance in the setting of sepsis. 5. Small amount of free fluid in the abdomen and pelvis. 6. Cholelithiasis without evidence of acute cholecystitis. 7. Aortic atherosclerosis. Electronically Signed   By: Burman Nieves M.D.   On: 09/22/2022 21:48   CT ABDOMEN PELVIS W CONTRAST  Result Date: 09/22/2022 CLINICAL DATA:  Pulmonary embolus suspected with high probability. Progressive weakness since prior discharge on Thursday for cardiac arrest. Sepsis. Abdominal pain. EXAM: CT ANGIOGRAPHY CHEST CT ABDOMEN AND PELVIS WITH CONTRAST TECHNIQUE: Multidetector CT imaging of the chest was performed using the standard protocol during bolus administration of intravenous contrast. Multiplanar CT image reconstructions and MIPs were obtained to evaluate the vascular anatomy. Multidetector CT imaging of the abdomen and pelvis was performed using the standard protocol  during bolus administration of intravenous contrast. RADIATION DOSE REDUCTION: This exam was performed according to the departmental dose-optimization program which includes automated exposure control, adjustment of the mA and/or kV according to patient size and/or use of iterative reconstruction technique. CONTRAST:  75mL OMNIPAQUE IOHEXOL 350 MG/ML SOLN COMPARISON:  CT chest 08/30/2022.  PET-CT 07/24/2022 FINDINGS: CTA CHEST FINDINGS Cardiovascular: Good opacification of the central and segmental pulmonary arteries. No focal filling defects. No evidence of significant pulmonary embolus. Cardiac enlargement. No pericardial effusions. Normal caliber thoracic aorta. Calcification of the aorta and coronary arteries. Mediastinum/Nodes: Thyroid gland is unremarkable. Esophagus is decompressed. No significant lymphadenopathy. Cardiac pacemaker. Lungs/Pleura: Large left pleural effusion. Atelectasis in both lung bases, greater on the left. Patchy infiltrates in the left upper lung and less prominent infiltrates in the right upper lung. Changes likely to represent multifocal pneumonia. Aspiration or edema would be less likely. Airways are patent. No pneumothorax. Musculoskeletal: Degenerative changes in the spine. No destructive bone lesions. Review of the MIP images confirms the above findings. CT ABDOMEN and PELVIS FINDINGS Hepatobiliary: Scattered subcentimeter low-attenuation lesions in the liver. These are similar to previous study. Likely metastatic. Stone in the gallbladder. No bile duct dilatation. Pancreas: Pancreatic parenchyma appears to be intact. There are multiloculated thick-walled heterogeneous cystic lesions demonstrated in the left upper quadrant, surrounding the tail of the pancreas and extending around the spleen into the subdiaphragmatic space. The largest of these measures about 7 x 9.3 cm. These are increasing since the prior study and likely represent progressing metastatic disease although  abscesses could also have this appearance. Spleen: Spleen is otherwise normal. Surgical clips in the left upper quadrant anterior to the spleen. Adrenals/Urinary Tract: Bilateral adrenal gland nodules are unchanged. 1.7 cm on the right and 1.5 cm on the left. Likely metastatic. Small bilateral renal cysts. Largest on the left measures 2.4 cm diameter. No imaging follow-up is indicated. Stomach/Bowel: Stomach, small bowel, and colon are not abnormally distended. Previous right hemicolectomy resection with ileocolonic anastomosis appearing  patent. Vascular/Lymphatic: Calcification of the aorta. No aneurysm. Scattered lymph nodes are not pathologically enlarged. Reproductive: Prostate is unremarkable. Other: Small amount of free fluid in the abdomen and pelvis along the pericolic gutters. No free air. Abdominal wall musculature appears intact. Musculoskeletal: Degenerative changes.  No destructive bone lesions. Review of the MIP images confirms the above findings. IMPRESSION: 1. No evidence of significant pulmonary embolus. 2. Large left pleural effusion. Bilateral basilar atelectasis. Patchy infiltrates in the lungs, greater on the left. Changes likely due to multifocal pneumonia although edema or aspiration would be secondary considerations. 3. Presumed metastatic lesions in the liver and adrenal glands are unchanged. 4. Heterogeneous complex cystic masses demonstrated in the left upper quadrant, around the spleen, and around the tail of the pancreas have significantly enlarged since previous study, likely metastatic although abscesses could also have this appearance in the setting of sepsis. 5. Small amount of free fluid in the abdomen and pelvis. 6. Cholelithiasis without evidence of acute cholecystitis. 7. Aortic atherosclerosis. Electronically Signed   By: Burman Nieves M.D.   On: 09/22/2022 21:48   DG Chest Port 1 View  Result Date: 09/22/2022 CLINICAL DATA:  Questionable sepsis - evaluate for abnormality  EXAM: PORTABLE CHEST 1 VIEW COMPARISON:  September 01, 2022 FINDINGS: The cardiomediastinal silhouette is unchanged in contour.LEFT chest AICD. No significant pneumothorax. Possible small LEFT pleural effusion. New LEFT retrocardiac opacity. Visualized abdomen is unremarkable. Advanced degenerative changes of the RIGHT shoulder. IMPRESSION: New LEFT retrocardiac opacity with possible small LEFT pleural effusion. Differential considerations include atelectasis versus infection. Electronically Signed   By: Meda Klinefelter M.D.   On: 09/22/2022 17:34    EKG: Independently reviewed. Sinus rhythm, 1st deg AV block, LBBB.   Assessment/Plan   1. COVID-19 infection  - Presents with marked fatigue and general weakness with mild cough and found to have fever and positive COVID pcr  - He has risk factors for progression to severe disease  - Start remdesivir, isolation, supportive care, trend markers    2. Cystic intraabdominal masses  - Complex cystic masses in LUQ have significantly enlarged since CT in September  - He has hx of intraabdominal abscess s/p IR drain placement in May and removed in August when CT demonstrated near-resolution  - Surgery was consulted by ED  - Continue empiric antibiotics, follow-up on surgery recommendations   3. Chronic combined systolic & diastolic CHF  - Appears compensated  - Continue Lasix, Entresto, Farxiga, metoprolol    4. Left pleural effusion  - Large left pleural effusion noted on CT in ED  - Consult IR for thoracentesis    5. CAD  - No anginal complaints  - Hold Plavix pending surgical consultation  - Continue Lipitor, metoprolol   6. PAF  - In SR in ED  - Hold Eliquis pending surgical consult, continue metoprolol and amiodarone    7. Colon cancer  -  Metastatic with liver mets and indeterminate pancreatic and adrenal lesions  - S/p resection in May 2023, started Xeloda in August 2023    8. Hypothyroidism  - Continue Synthroid    9. Seizure  disorder  - Continue Keppra    DVT prophylaxis: SCDs Code Status: DNR   Level of Care: Level of care: Telemetry Medical Family Communication: none present  Disposition Plan:  Patient is from: home  Anticipated d/c is to: TBD Anticipated d/c date is: 09/26/22  Patient currently: Pending surgical consultation regarding cystic intraabdominal masses, PT evaluation  Consults called: Surgery  Admission  status: Inpatient     Briscoe Deutscher, MD [facility]  09/23/2022, 12:45 AM

## 2022-09-22 NOTE — ED Notes (Signed)
Pt moved to room 38 at this time 

## 2022-09-22 NOTE — Telephone Encounter (Signed)
error 

## 2022-09-22 NOTE — ED Provider Notes (Signed)
[facility] EMERGENCY DEPARTMENT Provider Note   CSN: [id number] Arrival date & time: 09/22/22  1639     History {Add pertinent medical, surgical, social history, OB history to HPI:1} No chief complaint on file.   Gregory [name] is a 61 y.o. male.  HPI Patient presents for generalized weakness.  Medical history includes HLD, CHF, CAD, HTN, anemia, OSA, epilepsy, atrial fibrillation, T2DM.  He had a recent hospitalization for a seizure episode.  He required intubation and had periintubation cardiac arrest.  He got 1 round of CPR.  EEG showed severe diffuse encephalopathy.  He was treated for aspiration pneumonia.  He had a 12-day hospital stay followed by 1 week in rehab.  He was discharged home 4 days ago.  He has been living at home with his daughter who states that he has had generalized weakness since his return home.  She feels that this has been worsening over the past 4 days.  He was able to stand yesterday.  Today, he has a difficult time even sitting up in bed.  EMS noted that he was able to assist with transfer to stretcher.  Patient currently denies any areas of discomfort.  He has been answering questions from EMS appropriately.  He did not have any focal weakness but was noted to be leaning to the left side.  CBG was normal prior to arrival.  EMS noted a SPO2 of 88% on room air.  He is not on supplemental oxygen at baseline.  He was placed on nasal cannula.  Medications at time of recent discharge include Xanax, amiodarone, Eliquis, Lasix, Synthroid, Keppra, Percocet, metoprolol, and Entresto.  He was not discharged on antibiotics.    Home Medications Prior to Admission medications   Medication Sig Start Date End Date Taking? Authorizing Provider  acetaminophen (TYLENOL) 325 MG tablet Take 1-2 tablets (325-650 mg total) by mouth every 4 (four) hours as needed for mild pain. 06/06/22   Setzer, Edman Circle, PA-C  ALPRAZolam Duanne Moron) 0.25 MG tablet Take 1  tablet (0.25 mg total) by mouth 2 (two) times daily as needed for anxiety or sleep. 09/18/22   Love, Ivan Anchors, PA-C  amiodarone (PACERONE) 200 MG tablet Take 2 tablets (400 mg total) by mouth daily. 09/18/22   Love, Ivan Anchors, PA-C  apixaban (ELIQUIS) 5 MG TABS tablet Take 1 tablet (5 mg total) by mouth 2 (two) times daily. 09/18/22   Love, Ivan Anchors, PA-C  atorvastatin (LIPITOR) 80 MG tablet Take 1 tablet (80 mg total) by mouth every evening. 09/18/22   Love, Ivan Anchors, PA-C  capecitabine (XELODA) 500 MG tablet Take 4 tabs every 12 hours for 14 days then off for 7 days, take after a meal 09/19/22   [name], MD  clopidogrel (PLAVIX) 75 MG tablet Take 1 tablet (75 mg total) by mouth daily. 09/18/22   Love, Ivan Anchors, PA-C  dapagliflozin propanediol (FARXIGA) 10 MG TABS tablet Take 1 tablet (10 mg total) by mouth daily. 09/18/22   Love, Ivan Anchors, PA-C  diclofenac Sodium (VOLTAREN) 1 % GEL Apply 2 g topically 4 (four) times daily. Patient taking differently: Apply 2 g topically daily as needed (For pain). 06/06/22   Setzer, Edman Circle, PA-C  ezetimibe (ZETIA) 10 MG tablet Take 1 tablet (10 mg total) by mouth daily. 09/18/22 12/17/22  Love, Ivan Anchors, PA-C  furosemide (LASIX) 40 MG tablet Take 1 tablet (40 mg total) by mouth daily. 09/18/22   Love, Ivan Anchors, PA-C  levETIRAcetam (KEPPRA)  750 MG tablet Take 2 tablets (1,500 mg total) by mouth 2 (two) times daily. 09/18/22   Love, Ivan Anchors, PA-C  levothyroxine (SYNTHROID) 50 MCG tablet Take 1 tablet (50 mcg total) by mouth daily at 6 (six) AM. 09/18/22   Love, Ivan Anchors, PA-C  lidocaine (LMX) 4 % cream Apply topically 3 (three) times daily as needed (Apply to nostril for NG tube irritation). 09/18/22   Love, Ivan Anchors, PA-C  metoprolol succinate (TOPROL-XL) 50 MG 24 hr tablet Take 1 tablet (50 mg total) by mouth 2 (two) times daily. Take with or immediately following a meal. 09/18/22   Love, Ivan Anchors, PA-C  naloxone [facility]) nasal spray 4 mg/0.1 mL Use as needed in case of  overdose 09/18/22   Love, Ivan Anchors, PA-C  oxyCODONE-acetaminophen (PERCOCET/ROXICET) 5-325 MG tablet Take 1-2 tablets by mouth every 6 (six) hours as needed for moderate pain or severe pain. 09/18/22   Love, Ivan Anchors, PA-C  pantoprazole (PROTONIX) 40 MG tablet Take 1 tablet (40 mg total) by mouth daily. 09/18/22   Love, Ivan Anchors, PA-C  polyethylene glycol (MIRALAX / GLYCOLAX) 17 g packet Take 17 g by mouth 2 (two) times daily. Patient taking differently: Take 17 g by mouth daily as needed for mild constipation. 06/06/22   Setzer, Edman Circle, PA-C  potassium chloride (KLOR-CON M) 10 MEQ tablet Take 1 tablet (10 mEq total) by mouth daily. 09/18/22   Love, Ivan Anchors, PA-C  sacubitril-valsartan (ENTRESTO) 97-103 MG Take 1 tablet by mouth 2 (two) times daily. 09/18/22   Bary Leriche, PA-C      Allergies    Patient has no known allergies.    Review of Systems   Review of Systems  Constitutional:  Positive for activity change and fatigue.  Neurological:  Positive for weakness (Generalized).  Psychiatric/Behavioral:  Positive for confusion.   All other systems reviewed and are negative.   Physical Exam Updated Vital Signs There were no vitals taken for this visit. Physical Exam Vitals and nursing note reviewed.  Constitutional:      General: He is not in acute distress.    Appearance: He is well-developed. He is ill-appearing (Chronically). He is not toxic-appearing or diaphoretic.  HENT:     Head: Normocephalic and atraumatic.     Right Ear: External ear normal.     Left Ear: External ear normal.     Nose: Nose normal.     Mouth/Throat:     Mouth: Mucous membranes are dry.     Pharynx: Oropharynx is clear.  Eyes:     Extraocular Movements: Extraocular movements intact.     Conjunctiva/sclera: Conjunctivae normal.  Cardiovascular:     Rate and Rhythm: Normal rate and regular rhythm.     Heart sounds: No murmur heard. Pulmonary:     Effort: Pulmonary effort is normal. No respiratory  distress.     Breath sounds: Normal breath sounds. No wheezing or rales.  Chest:     Chest wall: No tenderness.  Abdominal:     Palpations: Abdomen is soft.     Tenderness: There is abdominal tenderness (RLQ).  Musculoskeletal:        General: No swelling. Normal range of motion.     Cervical back: Normal range of motion and neck supple. No rigidity.     Right lower leg: No edema.     Left lower leg: No edema.  Skin:    General: Skin is warm and dry.     Capillary Refill:  Capillary refill takes less than 2 seconds.     Coloration: Skin is not jaundiced or pale.  Neurological:     General: No focal deficit present.     Mental Status: He is alert and oriented to person, place, and time.     Cranial Nerves: No cranial nerve deficit.     Sensory: No sensory deficit.     Motor: No weakness.     Coordination: Coordination normal.  Psychiatric:        Mood and Affect: Mood normal.        Behavior: Behavior normal.        Thought Content: Thought content normal.        Judgment: Judgment normal.     ED Results / Procedures / Treatments   Labs (all labs ordered are listed, but only abnormal results are displayed) Labs Reviewed - No data to display  EKG None  Radiology No results found.  Procedures Procedures  {Document cardiac monitor, telemetry assessment procedure when appropriate:1}  Medications Ordered in ED Medications - No data to display  ED Course/ Medical Decision Making/ A&P                           Medical Decision Making Amount and/or Complexity of Data Reviewed Labs: ordered. Radiology: ordered. ECG/medicine tests: ordered.  Risk OTC drugs. Prescription drug management.   This patient presents to the ED for concern of ***, this involves an extensive number of treatment options, and is a complaint that carries with it a high risk of complications and morbidity.  The differential diagnosis includes ***   Co morbidities that complicate the patient  evaluation  ***   Additional history obtained:  Additional history obtained from *** External records from outside source obtained and reviewed including ***   Lab Tests:  I Ordered, and personally interpreted labs.  The pertinent results include:  ***   Imaging Studies ordered:  I ordered imaging studies including ***  I independently visualized and interpreted imaging which showed *** I agree with the radiologist interpretation   Cardiac Monitoring: / EKG:  The patient was maintained on a cardiac monitor.  I personally viewed and interpreted the cardiac monitored which showed an underlying rhythm of: ***   Consultations Obtained:  I requested consultation with the general surgeon, Dr. Kieth Brightly,  and discussed lab and imaging findings as well as pertinent plan - they recommend: Admission to medicine.  Surgery will follow.   Problem List / ED Course / Critical interventions / Medication management  *** I ordered medication including ***  for ***  Reevaluation of the patient after these medicines showed that the patient {resolved/improved/worsened:23923::"improved"} I have reviewed the patients home medicines and have made adjustments as needed   Social Determinants of Health:  ***   Test / Admission - Considered:  ***   {Document critical care time when appropriate:1} {Document review of labs and clinical decision tools ie heart score, Chads2Vasc2 etc:1}  {Document your independent review of radiology images, and any outside records:1} {Document your discussion with family members, caretakers, and with consultants:1} {Document social determinants of health affecting pt's care:1} {Document your decision making why or why not admission, treatments were needed:1} Final Clinical Impression(s) / ED Diagnoses Final diagnoses:  None    Rx / DC Orders ED Discharge Orders     None

## 2022-09-22 NOTE — Progress Notes (Signed)
Pharmacy Antibiotic Note  Dakotah Alphons Burgert is a 61 y.o. male admitted on 09/22/2022 presenting with weakness, concern for sepsis.  Pharmacy has been consulted for vancomycin and cefepime dosing.  Plan: Vancomycin 2000 mg IV x 1, then 1000 mg IV q 8h (eAUC 531) Cefepime 2g IV every 8 hours Monitor renal function, Cx and clinical progression to narrow Vancomycin levels as needed  Height: '6\' 6"'$  (198.1 cm) IBW/kg (Calculated) : 91.4  Temp (24hrs), Avg:101.9 F (38.8 C), Min:101.9 F (38.8 C), Max:101.9 F (38.8 C)  Recent Labs  Lab 09/17/22 0851 09/19/22 0908 09/22/22 1720 09/22/22 1733  WBC 8.1 7.3 5.8  --   CREATININE 1.01 0.93  --  1.00    Estimated Creatinine Clearance: 97.7 mL/min (by C-G formula based on SCr of 1 mg/dL).    No Known Allergies  Bertis Ruddy, PharmD Clinical Pharmacist ED Pharmacist Phone # [phone] 09/22/2022 6:08 PM

## 2022-09-22 NOTE — Sepsis Progress Note (Signed)
Sepsis protocol is being followed by [facility]. 

## 2022-09-22 NOTE — ED Notes (Signed)
Verbal report given to Tiffany D RN at this time

## 2022-09-22 NOTE — ED Notes (Signed)
Received verbal report from Nina C RN at this time 

## 2022-09-22 NOTE — ED Triage Notes (Signed)
Pt BIB  [facility] from home d/t progressed weakness since last Thursday. Per ems, pt was dc'ed from here Thursday followed a cardiac arrest. Ever since dc, pt has been able to stand and perform some ADLS, but not today. Pt was at 88% RA upon EMS arrival. A&O X4.   Cbg 132  HR 100  BP 150/100

## 2022-09-23 ENCOUNTER — Inpatient Hospital Stay (HOSPITAL_COMMUNITY): Payer: Commercial Managed Care - HMO

## 2022-09-23 DIAGNOSIS — I5042 Chronic combined systolic (congestive) and diastolic (congestive) heart failure: Secondary | ICD-10-CM | POA: Diagnosis not present

## 2022-09-23 DIAGNOSIS — I2510 Atherosclerotic heart disease of native coronary artery without angina pectoris: Secondary | ICD-10-CM | POA: Diagnosis not present

## 2022-09-23 DIAGNOSIS — U071 COVID-19: Secondary | ICD-10-CM | POA: Diagnosis not present

## 2022-09-23 DIAGNOSIS — I480 Paroxysmal atrial fibrillation: Secondary | ICD-10-CM | POA: Diagnosis not present

## 2022-09-23 HISTORY — PX: IR THORACENTESIS ASP PLEURAL SPACE W/IMG GUIDE: IMG5380

## 2022-09-23 LAB — GLUCOSE, PLEURAL OR PERITONEAL FLUID: Glucose, Fluid: 115 mg/dL

## 2022-09-23 LAB — PROTEIN, PLEURAL OR PERITONEAL FLUID: Total protein, fluid: 3.9 g/dL

## 2022-09-23 LAB — CBC WITH DIFFERENTIAL/PLATELET
Abs Immature Granulocytes: 0.07 10*3/uL (ref 0.00–0.07)
Basophils Absolute: 0 10*3/uL (ref 0.0–0.1)
Basophils Relative: 0 %
Eosinophils Absolute: 0 10*3/uL (ref 0.0–0.5)
Eosinophils Relative: 0 %
HCT: 30.5 % — ABNORMAL LOW (ref 39.0–52.0)
Hemoglobin: 10 g/dL — ABNORMAL LOW (ref 13.0–17.0)
Immature Granulocytes: 1 %
Lymphocytes Relative: 4 %
Lymphs Abs: 0.5 10*3/uL — ABNORMAL LOW (ref 0.7–4.0)
MCH: 32.2 pg (ref 26.0–34.0)
MCHC: 32.8 g/dL (ref 30.0–36.0)
MCV: 98.1 fL (ref 80.0–100.0)
Monocytes Absolute: 0.4 10*3/uL (ref 0.1–1.0)
Monocytes Relative: 3 %
Neutro Abs: 12 10*3/uL — ABNORMAL HIGH (ref 1.7–7.7)
Neutrophils Relative %: 92 %
Platelets: 280 10*3/uL (ref 150–400)
RBC: 3.11 MIL/uL — ABNORMAL LOW (ref 4.22–5.81)
RDW: 20.2 % — ABNORMAL HIGH (ref 11.5–15.5)
WBC: 13 10*3/uL — ABNORMAL HIGH (ref 4.0–10.5)
nRBC: 0 % (ref 0.0–0.2)

## 2022-09-23 LAB — BODY FLUID CELL COUNT WITH DIFFERENTIAL
Eos, Fluid: 0 %
Lymphs, Fluid: 48 %
Monocyte-Macrophage-Serous Fluid: 35 % — ABNORMAL LOW (ref 50–90)
Neutrophil Count, Fluid: 17 % (ref 0–25)
Total Nucleated Cell Count, Fluid: 420 cu mm (ref 0–1000)

## 2022-09-23 LAB — PROCALCITONIN
Procalcitonin: <0.1 ng/mL
Procalcitonin: <0.1 ng/mL

## 2022-09-23 LAB — D-DIMER, QUANTITATIVE: D-Dimer, Quant: 5.23 ug/mL-FEU — ABNORMAL HIGH (ref 0.00–0.50)

## 2022-09-23 LAB — COMPREHENSIVE METABOLIC PANEL
ALT: 63 U/L — ABNORMAL HIGH (ref 0–44)
AST: 65 U/L — ABNORMAL HIGH (ref 15–41)
Albumin: 2.5 g/dL — ABNORMAL LOW (ref 3.5–5.0)
Alkaline Phosphatase: 148 U/L — ABNORMAL HIGH (ref 38–126)
Anion gap: 8 (ref 5–15)
BUN: 14 mg/dL (ref 8–23)
CO2: 21 mmol/L — ABNORMAL LOW (ref 22–32)
Calcium: 7.3 mg/dL — ABNORMAL LOW (ref 8.9–10.3)
Chloride: 108 mmol/L (ref 98–111)
Creatinine, Ser: 0.95 mg/dL (ref 0.61–1.24)
GFR, Estimated: >60 mL/min (ref >60)
Glucose, Bld: 117 mg/dL — ABNORMAL HIGH (ref 70–99)
Potassium: 3.5 mmol/L (ref 3.5–5.1)
Sodium: 137 mmol/L (ref 135–145)
Total Bilirubin: 0.5 mg/dL (ref 0.3–1.2)
Total Protein: 6.3 g/dL — ABNORMAL LOW (ref 6.5–8.1)

## 2022-09-23 LAB — C-REACTIVE PROTEIN: CRP: 4.7 mg/dL — ABNORMAL HIGH (ref <1.0)

## 2022-09-23 LAB — PHOSPHORUS: Phosphorus: 3.8 mg/dL (ref 2.5–4.6)

## 2022-09-23 LAB — GRAM STAIN: Gram Stain: NONE SEEN

## 2022-09-23 LAB — LACTATE DEHYDROGENASE: LDH: 332 U/L — ABNORMAL HIGH (ref 98–192)

## 2022-09-23 LAB — MAGNESIUM: Magnesium: 1.7 mg/dL (ref 1.7–2.4)

## 2022-09-23 LAB — LACTATE DEHYDROGENASE, PLEURAL OR PERITONEAL FLUID: LD, Fluid: 284 U/L — ABNORMAL HIGH (ref 3–23)

## 2022-09-23 MED ORDER — MAGNESIUM SULFATE 2 GM/50ML IV SOLN
2.0000 g | Freq: Once | INTRAVENOUS | Status: AC
  Administered 2022-09-23: 2 g via INTRAVENOUS
  Filled 2022-09-23: qty 50

## 2022-09-23 MED ORDER — LIDOCAINE HCL (PF) 1 % IJ SOLN
INTRAMUSCULAR | Status: AC | PRN
  Administered 2022-09-23: 5 mL

## 2022-09-23 MED ORDER — LIDOCAINE HCL 1 % IJ SOLN
INTRAMUSCULAR | Status: AC
  Filled 2022-09-23: qty 20

## 2022-09-23 MED ORDER — POTASSIUM CHLORIDE CRYS ER 20 MEQ PO TBCR
40.0000 meq | EXTENDED_RELEASE_TABLET | Freq: Once | ORAL | Status: AC
  Administered 2022-09-23: 40 meq via ORAL
  Filled 2022-09-23: qty 2

## 2022-09-23 NOTE — Procedures (Signed)
PROCEDURE SUMMARY:  Successful image-guided left thoracentesis. Yielded 1.3 L of hazy brown fluid. Pt tolerated procedure well. No immediate complications. EBL = trace   Specimen was sent for labs. CXR ordered.  Please see imaging section of Epic for full dictation.  Armando Gang Holden Draughon PA-C 09/23/2022 10:37 AM

## 2022-09-23 NOTE — ED Notes (Signed)
Patient moved to hospital bed for comfort due to height.  New sheets, gown, and blanket provided

## 2022-09-23 NOTE — ED Notes (Signed)
Patient noticed to have intermittent drops in SpO2.  Saturations will drop into the 70's and immediately return to the 90's.  Patient placed on 2 L [place] to assist with saturations.  Will continue to monitor

## 2022-09-23 NOTE — Consult Note (Signed)
Patient with known history of metastatic colon cancer s/p laparoscopic assisted R colectomy 04/2022. 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 Pt has mets to liver and adrenals. PET scan 07/2022 w/ no known pulmonary mets. Was on Xeloda per Dr. Burr Medico for medical mgmt which was also postponed due to deconditioning after admission for cardiac arrest 09/11/22.   He presented with weakness and productive cough. CT PE and CT Abd/pelvis reviewed. He has LUQ cystic masses noted on CT abdomen, this could be splenic necrosis after embolization or it could be progression of his cancer. Given he is non-tender and without intestinal obstruction, I do not think any acute intervention is warranted. Defer treatment of his left COVID and pleural effusion/pulmonary process to the primary team.   No acute surgical intervention advised at this time. [facility] will not follow acutely. Please call back as needed.   Obie Dredge, PA-C [facility] Please see Amion for pager number during day hours 7:00am-4:30pm

## 2022-09-23 NOTE — Progress Notes (Addendum)
PROGRESS NOTE        PATIENT DETAILS Name: Gregory [name] Age: 61 y.o. Sex: male Date of Birth: 1961/10/24 Admit Date: 09/22/2022 Admitting Physician Vianne Bulls, MD HDQ:[id number], No Pcp Per  Brief Summary: Patient is a 61 y.o.  male with history of chronic combined HFpEF/HFrEF, EF, CAD, seizure disorder, metastatic colon cancer, recent PEA cardiac arrest in the setting of aspiration following seizure-just discharged from inpatient rehab/CIR on 10/5-presenting with 2-3 history of weakness, cough, fever-found to have COVID-19 infection.  See below for further details.  Significant events: 916-9/28>> hospitalization for PEA arrest in the setting of 6 aspiration/seizure.  Discharged to CIR 9/28-10/5>> at CIR 10/9>> admit to [facility]-weakness-COVID-19 infection.  Significant studies: 10/9>> CT angio chest: No PE, large left pleural effusion, patchy infiltrates bilaterally.  Presumed metastatic lesions in liver/adrenal gland. 10/9>> CT abdomen/pelvis: Heterogeneous complex cystic masses in the left upper quadrant, around the spleen, around the tail of the pancreas.  Significant microbiology data: 10/9>> COVID PCR: Positive 10/9>> influenza a/B: Negative 10/9>> blood culture: Pending 10/9>> urine culture: Pending  Procedures: None  Consults: IR CCS  Subjective: Lying comfortably in bed-denies any chest pain or shortness of breath.  Denies any abdominal pain  Objective: Vitals: Blood pressure (!) 134/106, pulse 81, temperature 98 F (36.7 C), temperature source Oral, resp. rate 15, height '6\' 6"'$  (1.981 m), weight 86.2 kg, SpO2 94 %.   Exam: Gen Exam:Alert awake-not in any distress HEENT:atraumatic, normocephalic Chest: B/L clear to auscultation anteriorly CVS:S1S2 regular Abdomen:soft non tender, non distended Extremities:no edema Neurology: Non focal Skin: no rash  Pertinent Labs/Radiology:    Latest Ref Rng & Units 09/23/2022    6:30  AM 09/22/2022    5:33 PM 09/22/2022    5:32 PM  CBC  WBC 4.0 - 10.5 K/uL 13.0     Hemoglobin 13.0 - 17.0 g/dL 10.0  16.0  15.3   Hematocrit 39.0 - 52.0 % 30.5  47.0  45.0   Platelets 150 - 400 K/uL 280       Lab Results  Component Value Date   NA 137 09/23/2022   K 3.5 09/23/2022   CL 108 09/23/2022   CO2 21 (L) 09/23/2022      Assessment/Plan: COVID-19 PNA Likely cause of fever/weakness Continue Remdesivir On room air-no indication for steroids Follow closely.  Multiple cystic intra-abdominal masses Asymptomatic-no history of abdominal pain Appreciate CCS input-since asymptomatic-no intervention recommended. Doubt infection given asymptomatic-could be due to underlying malignancy.  Will need to discuss with oncology-continue to hold Eliquis for now. Stop antibiotics and monitor  Addendum Discussed with Oncology-Dr Blondell Reveal will evaluate and provide recommendations.  Continue to hold Eliquis/Plavix until seen by oncology as patient may need aspiration/biopsy.  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  CAD Last cath 5/23 with stable findings-similar to prior-with known occlusion of distal CX/RCA. Resume Plavix when assured that patient does not need aspiration/biopsy of  intra-abdominal cystic masses. No anginal symptoms.  History of metastatic colon cancer-s/p right  hemicolectomy-complicated by intra-abdominal abscesses Follows with oncology Will need to discuss with Dr. Burr Medico prior to discharge-regarding above findings of cystic lesions intra-abdominal line.  Hypothyroidism Continue Synthroid  Seizure disorder Continue Keppra  BMI: Estimated body mass index is 21.96 kg/m as calculated from the following:   Height as of this encounter: '6\' 6"'$  (1.981 m).   Weight as of this encounter: 86.2 kg.   Code status:   Code Status: DNR   DVT Prophylaxis: SCDs Start: 09/22/22 2251   Family Communication: None at bedside   Disposition Plan: Status is: Inpatient Remains inpatient appropriate because: COVID-19 infection-weakness-not stable for discharge.   Planned Discharge Destination:Home health   Diet: Diet Order             Diet NPO time specified Except for: Ice Chips, Sips with Meds  Diet effective now                     Antimicrobial agents: Anti-infectives (From admission, onward)    Start     Dose/Rate Route Frequency Ordered Stop   09/23/22 1000  remdesivir 100 mg in sodium chloride 0.9 % 100 mL IVPB       See Hyperspace for full Linked Orders Report.   100 mg 200 mL/hr over 30 Minutes Intravenous Daily 09/22/22 2252 09/25/22 0959   09/23/22 0600  ceFEPIme (MAXIPIME) 2 g in sodium chloride 0.9 % 100 mL IVPB        2 g 200 mL/hr over 30 Minutes Intravenous Every 8 hours 09/22/22 1810     09/23/22 0515  metroNIDAZOLE (FLAGYL) IVPB 500 mg        500 mg 100 mL/hr over 60 Minutes Intravenous Every 12 hours 09/22/22 2252     09/23/22 0500  vancomycin (VANCOCIN) IVPB 1000 mg/200 mL premix  Status:  Discontinued        1,000 mg 200 mL/hr over 60 Minutes Intravenous Every 8 hours 09/22/22 1810 09/22/22 2252   09/22/22 2300  remdesivir 200 mg in sodium chloride 0.9% 250 mL IVPB       See Hyperspace for full Linked Orders Report.   200 mg 580 mL/hr over 30 Minutes Intravenous Once 09/22/22 2252 09/23/22 0427   09/22/22 2200   molnupiravir EUA (LAGEVRIO) capsule 800 mg  Status:  Discontinued        4 capsule Oral 2 times daily 09/22/22 2116 09/22/22 2136   09/22/22 1730  vancomycin (VANCOREADY) IVPB 2000 mg/400 mL        2,000 mg 200 mL/hr over 120 Minutes Intravenous  Once 09/22/22 1715 09/22/22 2232   09/22/22 1715  ceFEPIme (MAXIPIME) 2 g in sodium chloride 0.9 % 100 mL IVPB        2 g 200 mL/hr over 30 Minutes Intravenous  Once 09/22/22 1705 09/22/22 1805   09/22/22 1715  metroNIDAZOLE (FLAGYL) IVPB 500 mg        500 mg 100 mL/hr over 60 Minutes Intravenous  Once 09/22/22 1705 09/22/22 1837   09/22/22 1715  vancomycin (VANCOCIN) IVPB 1000 mg/200 mL premix  Status:  Discontinued        1,000 mg 200 mL/hr over 60 Minutes Intravenous  Once 09/22/22 1705 09/22/22 1715        MEDICATIONS: Scheduled Meds:  amiodarone  400 mg Oral Daily   atorvastatin  80 mg Oral QPM   dapagliflozin propanediol  10 mg Oral Daily  diclofenac Sodium  2 g Topical QID   furosemide  40 mg Oral Daily   levETIRAcetam  1,500 mg Oral BID   levothyroxine  50 mcg Oral Q0600   lidocaine       metoprolol succinate  50 mg Oral BID   pantoprazole  40 mg Oral Daily   sacubitril-valsartan  1 tablet Oral BID   sodium chloride flush  3 mL Intravenous Q12H   Continuous Infusions:  ceFEPime (MAXIPIME) IV 2 g (09/23/22 0631)   metronidazole 500 mg (09/23/22 0639)   remdesivir 100 mg in sodium chloride 0.9 % 100 mL IVPB     PRN Meds:.acetaminophen, ALPRAZolam, guaiFENesin-dextromethorphan, lidocaine, oxyCODONE-acetaminophen, polyethylene glycol   I have personally reviewed following labs and imaging studies  LABORATORY DATA: CBC: Recent Labs  Lab 09/17/22 0851 09/19/22 0908 09/22/22 1720 09/22/22 1732 09/22/22 1733 09/23/22 0630  WBC 8.1 7.3 5.8  --   --  13.0*  NEUTROABS  --  5.8 4.9  --   --  12.0*  HGB 11.9* 13.0 14.1 15.3 16.0 10.0*  HCT 37.6* 40.4 43.4 45.0 47.0 30.5*  MCV 99.7 98.8 97.7  --   --  98.1  PLT 359 335  318  --   --  902    Basic Metabolic Panel: Recent Labs  Lab 09/17/22 0851 09/19/22 0908 09/22/22 1720 09/22/22 1732 09/22/22 1733 09/23/22 0630  NA 137 138 136 136 136 137  K 4.7 4.5 4.0 4.1 4.1 3.5  CL 102 105 99  --  99 108  CO2 '26 28 26  '$ --   --  21*  GLUCOSE 134* 136* 108*  --  104* 117*  BUN '13 16 13  '$ --  18 14  CREATININE 1.01 0.93 1.15  --  1.00 0.95  CALCIUM 8.3* 8.6* 8.6*  --   --  7.3*  MG  --   --  2.2  --   --  1.7  PHOS  --   --   --   --   --  3.8    GFR: Estimated Creatinine Clearance: 97 mL/min (by C-G formula based on SCr of 0.95 mg/dL).  Liver Function Tests: Recent Labs  Lab 09/17/22 0851 09/19/22 0908 09/22/22 1720 09/23/22 0630  AST 52* 32 56* 65*  ALT 62* 54* 61* 63*  ALKPHOS 197* 224* 208* 148*  BILITOT 0.1* 0.4 0.5 0.5  PROT 6.9 7.9 8.3* 6.3*  ALBUMIN 2.7* 3.4* 3.3* 2.5*   No results for input(s): "LIPASE", "AMYLASE" in the last 168 hours. No results for input(s): "AMMONIA" in the last 168 hours.  Coagulation Profile: Recent Labs  Lab 09/22/22 1720  INR 1.5*    Cardiac Enzymes: Recent Labs  Lab 09/22/22 1720  CKTOTAL 147    BNP (last 3 results) No results for input(s): "PROBNP" in the last 8760 hours.  Lipid Profile: No results for input(s): "CHOL", "HDL", "LDLCALC", "TRIG", "CHOLHDL", "LDLDIRECT" in the last 72 hours.  Thyroid Function Tests: No results for input(s): "TSH", "T4TOTAL", "FREET4", "T3FREE", "THYROIDAB" in the last 72 hours.  Anemia Panel: No results for input(s): "VITAMINB12", "FOLATE", "FERRITIN", "TIBC", "IRON", "RETICCTPCT" in the last 72 hours.  Urine analysis:    Component Value Date/Time   COLORURINE YELLOW 09/22/2022 1850   APPEARANCEUR CLEAR 09/22/2022 1850   LABSPEC 1.011 09/22/2022 1850   PHURINE 6.0 09/22/2022 1850   GLUCOSEU >=500 (A) 09/22/2022 1850   HGBUR NEGATIVE 09/22/2022 [place] NEGATIVE 09/22/2022 [place] NEGATIVE 09/22/2022 1850  PROTEINUR NEGATIVE  09/22/2022 1850   NITRITE NEGATIVE 09/22/2022 1850   LEUKOCYTESUR NEGATIVE 09/22/2022 1850    Sepsis Labs: Lactic Acid, Venous    Component Value Date/Time   LATICACIDVEN 1.6 09/22/2022 1720    MICROBIOLOGY: Recent Results (from the past 240 hour(s))  Resp Panel by RT-PCR (Flu A&B, Covid) Anterior Nasal Swab     Status: Abnormal   Collection Time: 09/22/22  5:18 PM   Specimen: Anterior Nasal Swab  Result Value Ref Range Status   SARS Coronavirus 2 by RT PCR POSITIVE (A) NEGATIVE Final    Comment: (NOTE) SARS-CoV-2 target nucleic acids are DETECTED.  The SARS-CoV-2 RNA is generally detectable in upper respiratory specimens during the acute phase of infection. Positive results are indicative of the presence of the identified virus, but do not rule out bacterial infection or co-infection with other pathogens not detected by the test. Clinical correlation with patient history and other diagnostic information is necessary to determine patient infection status. The expected result is Negative.  Fact Sheet for Patients: EntrepreneurPulse.com.au  Fact Sheet for Healthcare Providers: IncredibleEmployment.be  This test is not yet approved or cleared by the [place] FDA and  has been authorized for detection and/or diagnosis of SARS-CoV-2 by FDA under an Emergency Use Authorization (EUA).  This EUA will remain in effect (meaning this test can be used) for the duration of  the COVID-19 declaration under Section 564(b)(1) of the A ct, 21 U.S.C. section 360bbb-3(b)(1), unless the authorization is terminated or revoked sooner.     Influenza A by PCR NEGATIVE NEGATIVE Final   Influenza B by PCR NEGATIVE NEGATIVE Final    Comment: (NOTE) The Xpert Xpress SARS-CoV-2/FLU/RSV plus assay is intended as an aid in the diagnosis of influenza from Nasopharyngeal swab specimens and should not be used as a sole basis for treatment. Nasal washings  and aspirates are unacceptable for Xpert Xpress SARS-CoV-2/FLU/RSV testing.  Fact Sheet for Patients: EntrepreneurPulse.com.au  Fact Sheet for Healthcare Providers: IncredibleEmployment.be  This test is not yet approved or cleared by the [place] FDA and has been authorized for detection and/or diagnosis of SARS-CoV-2 by FDA under an Emergency Use Authorization (EUA). This EUA will remain in effect (meaning this test can be used) for the duration of the COVID-19 declaration under Section 564(b)(1) of the Act, 21 U.S.C. section 360bbb-3(b)(1), unless the authorization is terminated or revoked.  Performed at [facility], [place] [address], [place], [place] [address]     RADIOLOGY STUDIES/RESULTS: CT Angio Chest PE W and/or Wo Contrast  Result Date: 09/22/2022 CLINICAL DATA:  Pulmonary embolus suspected with high probability. Progressive weakness since prior discharge on Thursday for cardiac arrest. Sepsis. Abdominal pain. EXAM: CT ANGIOGRAPHY CHEST CT ABDOMEN AND PELVIS WITH CONTRAST TECHNIQUE: Multidetector CT imaging of the chest was performed using the standard protocol during bolus administration of intravenous contrast. Multiplanar CT image reconstructions and MIPs were obtained to evaluate the vascular anatomy. Multidetector CT imaging of the abdomen and pelvis was performed using the standard protocol during bolus administration of intravenous contrast. RADIATION DOSE REDUCTION: This exam was performed according to the departmental dose-optimization program which includes automated exposure control, adjustment of the mA and/or kV according to patient size and/or use of iterative reconstruction technique. CONTRAST:  67m OMNIPAQUE IOHEXOL 350 MG/ML SOLN COMPARISON:  CT chest 08/30/2022.  PET-CT 07/24/2022 FINDINGS: CTA CHEST FINDINGS Cardiovascular: Good opacification of the central and segmental pulmonary arteries. No focal filling defects.  No evidence of significant pulmonary embolus.  Cardiac enlargement. No pericardial effusions. Normal caliber thoracic aorta. Calcification of the aorta and coronary arteries. Mediastinum/Nodes: Thyroid gland is unremarkable. Esophagus is decompressed. No significant lymphadenopathy. Cardiac pacemaker. Lungs/Pleura: Large left pleural effusion. Atelectasis in both lung bases, greater on the left. Patchy infiltrates in the left upper lung and less prominent infiltrates in the right upper lung. Changes likely to represent multifocal pneumonia. Aspiration or edema would be less likely. Airways are patent. No pneumothorax. Musculoskeletal: Degenerative changes in the spine. No destructive bone lesions. Review of the MIP images confirms the above findings. CT ABDOMEN and PELVIS FINDINGS Hepatobiliary: Scattered subcentimeter low-attenuation lesions in the liver. These are similar to previous study. Likely metastatic. Stone in the gallbladder. No bile duct dilatation. Pancreas: Pancreatic parenchyma appears to be intact. There are multiloculated thick-walled heterogeneous cystic lesions demonstrated in the left upper quadrant, surrounding the tail of the pancreas and extending around the spleen into the subdiaphragmatic space. The largest of these measures about 7 x 9.3 cm. These are increasing since the prior study and likely represent progressing metastatic disease although abscesses could also have this appearance. Spleen: Spleen is otherwise normal. Surgical clips in the left upper quadrant anterior to the spleen. Adrenals/Urinary Tract: Bilateral adrenal gland nodules are unchanged. 1.7 cm on the right and 1.5 cm on the left. Likely metastatic. Small bilateral renal cysts. Largest on the left measures 2.4 cm diameter. No imaging follow-up is indicated. Stomach/Bowel: Stomach, small bowel, and colon are not abnormally distended. Previous right hemicolectomy resection with ileocolonic anastomosis appearing patent.  Vascular/Lymphatic: Calcification of the aorta. No aneurysm. Scattered lymph nodes are not pathologically enlarged. Reproductive: Prostate is unremarkable. Other: Small amount of free fluid in the abdomen and pelvis along the pericolic gutters. No free air. Abdominal wall musculature appears intact. Musculoskeletal: Degenerative changes.  No destructive bone lesions. Review of the MIP images confirms the above findings. IMPRESSION: 1. No evidence of significant pulmonary embolus. 2. Large left pleural effusion. Bilateral basilar atelectasis. Patchy infiltrates in the lungs, greater on the left. Changes likely due to multifocal pneumonia although edema or aspiration would be secondary considerations. 3. Presumed metastatic lesions in the liver and adrenal glands are unchanged. 4. Heterogeneous complex cystic masses demonstrated in the left upper quadrant, around the spleen, and around the tail of the pancreas have significantly enlarged since previous study, likely metastatic although abscesses could also have this appearance in the setting of sepsis. 5. Small amount of free fluid in the abdomen and pelvis. 6. Cholelithiasis without evidence of acute cholecystitis. 7. Aortic atherosclerosis. Electronically Signed   By: Lucienne Capers M.D.   On: 09/22/2022 21:48   CT ABDOMEN PELVIS W CONTRAST  Result Date: 09/22/2022 CLINICAL DATA:  Pulmonary embolus suspected with high probability. Progressive weakness since prior discharge on Thursday for cardiac arrest. Sepsis. Abdominal pain. EXAM: CT ANGIOGRAPHY CHEST CT ABDOMEN AND PELVIS WITH CONTRAST TECHNIQUE: Multidetector CT imaging of the chest was performed using the standard protocol during bolus administration of intravenous contrast. Multiplanar CT image reconstructions and MIPs were obtained to evaluate the vascular anatomy. Multidetector CT imaging of the abdomen and pelvis was performed using the standard protocol during bolus administration of intravenous  contrast. RADIATION DOSE REDUCTION: This exam was performed according to the departmental dose-optimization program which includes automated exposure control, adjustment of the mA and/or kV according to patient size and/or use of iterative reconstruction technique. CONTRAST:  56m OMNIPAQUE IOHEXOL 350 MG/ML SOLN COMPARISON:  CT chest 08/30/2022.  PET-CT 07/24/2022 FINDINGS: CTA CHEST FINDINGS Cardiovascular:  Good opacification of the central and segmental pulmonary arteries. No focal filling defects. No evidence of significant pulmonary embolus. Cardiac enlargement. No pericardial effusions. Normal caliber thoracic aorta. Calcification of the aorta and coronary arteries. Mediastinum/Nodes: Thyroid gland is unremarkable. Esophagus is decompressed. No significant lymphadenopathy. Cardiac pacemaker. Lungs/Pleura: Large left pleural effusion. Atelectasis in both lung bases, greater on the left. Patchy infiltrates in the left upper lung and less prominent infiltrates in the right upper lung. Changes likely to represent multifocal pneumonia. Aspiration or edema would be less likely. Airways are patent. No pneumothorax. Musculoskeletal: Degenerative changes in the spine. No destructive bone lesions. Review of the MIP images confirms the above findings. CT ABDOMEN and PELVIS FINDINGS Hepatobiliary: Scattered subcentimeter low-attenuation lesions in the liver. These are similar to previous study. Likely metastatic. Stone in the gallbladder. No bile duct dilatation. Pancreas: Pancreatic parenchyma appears to be intact. There are multiloculated thick-walled heterogeneous cystic lesions demonstrated in the left upper quadrant, surrounding the tail of the pancreas and extending around the spleen into the subdiaphragmatic space. The largest of these measures about 7 x 9.3 cm. These are increasing since the prior study and likely represent progressing metastatic disease although abscesses could also have this appearance. Spleen:  Spleen is otherwise normal. Surgical clips in the left upper quadrant anterior to the spleen. Adrenals/Urinary Tract: Bilateral adrenal gland nodules are unchanged. 1.7 cm on the right and 1.5 cm on the left. Likely metastatic. Small bilateral renal cysts. Largest on the left measures 2.4 cm diameter. No imaging follow-up is indicated. Stomach/Bowel: Stomach, small bowel, and colon are not abnormally distended. Previous right hemicolectomy resection with ileocolonic anastomosis appearing patent. Vascular/Lymphatic: Calcification of the aorta. No aneurysm. Scattered lymph nodes are not pathologically enlarged. Reproductive: Prostate is unremarkable. Other: Small amount of free fluid in the abdomen and pelvis along the pericolic gutters. No free air. Abdominal wall musculature appears intact. Musculoskeletal: Degenerative changes.  No destructive bone lesions. Review of the MIP images confirms the above findings. IMPRESSION: 1. No evidence of significant pulmonary embolus. 2. Large left pleural effusion. Bilateral basilar atelectasis. Patchy infiltrates in the lungs, greater on the left. Changes likely due to multifocal pneumonia although edema or aspiration would be secondary considerations. 3. Presumed metastatic lesions in the liver and adrenal glands are unchanged. 4. Heterogeneous complex cystic masses demonstrated in the left upper quadrant, around the spleen, and around the tail of the pancreas have significantly enlarged since previous study, likely metastatic although abscesses could also have this appearance in the setting of sepsis. 5. Small amount of free fluid in the abdomen and pelvis. 6. Cholelithiasis without evidence of acute cholecystitis. 7. Aortic atherosclerosis. Electronically Signed   By: Lucienne Capers M.D.   On: 09/22/2022 21:48   DG Chest Port 1 View  Result Date: 09/22/2022 CLINICAL DATA:  Questionable sepsis - evaluate for abnormality EXAM: PORTABLE CHEST 1 VIEW COMPARISON:  September 01, 2022 FINDINGS: The cardiomediastinal silhouette is unchanged in contour.LEFT chest AICD. No significant pneumothorax. Possible small LEFT pleural effusion. New LEFT retrocardiac opacity. Visualized abdomen is unremarkable. Advanced degenerative changes of the RIGHT shoulder. IMPRESSION: New LEFT retrocardiac opacity with possible small LEFT pleural effusion. Differential considerations include atelectasis versus infection. Electronically Signed   By: Valentino Saxon M.D.   On: 09/22/2022 17:34     LOS: 1 day   Oren Binet, MD  [facility]    To contact the attending provider between 7A-7P or the covering provider during after hours 7P-7A, please log into the  web site [email or web address] and access using universal [place] password for that web site. If you do not have the password, please call the hospital operator.  09/23/2022, 9:51 AM

## 2022-09-24 ENCOUNTER — Ambulatory Visit: Payer: PRIVATE HEALTH INSURANCE | Admitting: Cardiovascular Disease

## 2022-09-24 DIAGNOSIS — U071 COVID-19: Secondary | ICD-10-CM | POA: Diagnosis not present

## 2022-09-24 DIAGNOSIS — I5042 Chronic combined systolic (congestive) and diastolic (congestive) heart failure: Secondary | ICD-10-CM | POA: Diagnosis not present

## 2022-09-24 DIAGNOSIS — I480 Paroxysmal atrial fibrillation: Secondary | ICD-10-CM | POA: Diagnosis not present

## 2022-09-24 DIAGNOSIS — I2510 Atherosclerotic heart disease of native coronary artery without angina pectoris: Secondary | ICD-10-CM | POA: Diagnosis not present

## 2022-09-24 LAB — CBC WITH DIFFERENTIAL/PLATELET
Abs Immature Granulocytes: 0.04 10*3/uL (ref 0.00–0.07)
Basophils Absolute: 0 10*3/uL (ref 0.0–0.1)
Basophils Relative: 0 %
Eosinophils Absolute: 0 10*3/uL (ref 0.0–0.5)
Eosinophils Relative: 0 %
HCT: 37.9 % — ABNORMAL LOW (ref 39.0–52.0)
Hemoglobin: 12.7 g/dL — ABNORMAL LOW (ref 13.0–17.0)
Immature Granulocytes: 1 %
Lymphocytes Relative: 9 %
Lymphs Abs: 0.8 10*3/uL (ref 0.7–4.0)
MCH: 32.2 pg (ref 26.0–34.0)
MCHC: 33.5 g/dL (ref 30.0–36.0)
MCV: 95.9 fL (ref 80.0–100.0)
Monocytes Absolute: 0.4 10*3/uL (ref 0.1–1.0)
Monocytes Relative: 4 %
Neutro Abs: 7.1 10*3/uL (ref 1.7–7.7)
Neutrophils Relative %: 86 %
Platelets: 276 10*3/uL (ref 150–400)
RBC: 3.95 MIL/uL — ABNORMAL LOW (ref 4.22–5.81)
RDW: 20.4 % — ABNORMAL HIGH (ref 11.5–15.5)
WBC: 8.3 10*3/uL (ref 4.0–10.5)
nRBC: 0 % (ref 0.0–0.2)

## 2022-09-24 LAB — COMPREHENSIVE METABOLIC PANEL
ALT: 113 U/L — ABNORMAL HIGH (ref 0–44)
AST: 138 U/L — ABNORMAL HIGH (ref 15–41)
Albumin: 2.5 g/dL — ABNORMAL LOW (ref 3.5–5.0)
Alkaline Phosphatase: 153 U/L — ABNORMAL HIGH (ref 38–126)
Anion gap: 7 (ref 5–15)
BUN: 23 mg/dL (ref 8–23)
CO2: 21 mmol/L — ABNORMAL LOW (ref 22–32)
Calcium: 8.1 mg/dL — ABNORMAL LOW (ref 8.9–10.3)
Chloride: 106 mmol/L (ref 98–111)
Creatinine, Ser: 1.16 mg/dL (ref 0.61–1.24)
GFR, Estimated: >60 mL/min (ref >60)
Glucose, Bld: 196 mg/dL — ABNORMAL HIGH (ref 70–99)
Potassium: 3.9 mmol/L (ref 3.5–5.1)
Sodium: 134 mmol/L — ABNORMAL LOW (ref 135–145)
Total Bilirubin: 0.5 mg/dL (ref 0.3–1.2)
Total Protein: 6.4 g/dL — ABNORMAL LOW (ref 6.5–8.1)

## 2022-09-24 LAB — APTT
aPTT: 39 seconds — ABNORMAL HIGH (ref 24–36)
aPTT: 78 seconds — ABNORMAL HIGH (ref 24–36)

## 2022-09-24 LAB — MAGNESIUM: Magnesium: 2.4 mg/dL (ref 1.7–2.4)

## 2022-09-24 LAB — HEPARIN LEVEL (UNFRACTIONATED): Heparin Unfractionated: 0.53 IU/mL (ref 0.30–0.70)

## 2022-09-24 LAB — C-REACTIVE PROTEIN: CRP: 8.5 mg/dL — ABNORMAL HIGH (ref <1.0)

## 2022-09-24 MED ORDER — SODIUM CHLORIDE 0.9 % IV SOLN
100.0000 mg | Freq: Every day | INTRAVENOUS | Status: AC
  Administered 2022-09-25: 100 mg via INTRAVENOUS
  Filled 2022-09-24: qty 20

## 2022-09-24 MED ORDER — HEPARIN (PORCINE) 25000 UT/250ML-% IV SOLN
1150.0000 [IU]/h | INTRAVENOUS | Status: DC
  Administered 2022-09-24 – 2022-09-25 (×2): 1350 [IU]/h via INTRAVENOUS
  Filled 2022-09-24 (×2): qty 250

## 2022-09-24 MED ORDER — AMOXICILLIN 250 MG PO CAPS
250.0000 mg | ORAL_CAPSULE | Freq: Three times a day (TID) | ORAL | Status: DC
  Administered 2022-09-24 – 2022-09-26 (×6): 250 mg via ORAL
  Filled 2022-09-24 (×9): qty 1

## 2022-09-24 MED ORDER — METOPROLOL SUCCINATE ER 50 MG PO TB24
50.0000 mg | ORAL_TABLET | Freq: Every day | ORAL | Status: DC
  Administered 2022-09-25 – 2022-09-26 (×2): 50 mg via ORAL
  Filled 2022-09-24 (×2): qty 1

## 2022-09-24 MED ORDER — FUROSEMIDE 20 MG PO TABS
20.0000 mg | ORAL_TABLET | Freq: Every day | ORAL | Status: DC
  Administered 2022-09-25 – 2022-09-26 (×2): 20 mg via ORAL
  Filled 2022-09-24 (×2): qty 1

## 2022-09-24 MED ORDER — SACUBITRIL-VALSARTAN 49-51 MG PO TABS
1.0000 | ORAL_TABLET | Freq: Two times a day (BID) | ORAL | Status: DC
  Administered 2022-09-24 – 2022-09-26 (×4): 1 via ORAL
  Filled 2022-09-24 (×5): qty 1

## 2022-09-24 MED ORDER — POTASSIUM CHLORIDE CRYS ER 20 MEQ PO TBCR
20.0000 meq | EXTENDED_RELEASE_TABLET | Freq: Once | ORAL | Status: AC
  Administered 2022-09-24: 20 meq via ORAL
  Filled 2022-09-24: qty 1

## 2022-09-24 NOTE — Progress Notes (Signed)
PROGRESS NOTE        PATIENT DETAILS Name: Gregory [name] Age: 61 y.o. Sex: male Date of Birth: Dec 06, 1961 Admit Date: 09/22/2022 Admitting Physician Vianne Bulls, MD IDP:[id number], No Pcp Per  Brief Summary: Patient is a 61 y.o.  male with history of chronic combined HFpEF/HFrEF, EF, CAD, seizure disorder, metastatic colon cancer, recent PEA cardiac arrest in the setting of aspiration following seizure-just discharged from inpatient rehab/CIR on 10/5-presenting with 2-3 history of weakness, cough, fever-found to have COVID-19 infection.  See below for further details.  Significant events: 916-9/28>> hospitalization for PEA arrest in the setting of 6 aspiration/seizure.  Discharged to CIR 9/28-10/5>> at CIR 10/9>> admit to [facility]-weakness-COVID-19 infection.  Significant studies: 10/9>> CT angio chest: No PE, large left pleural effusion, patchy infiltrates bilaterally.  Presumed metastatic lesions in liver/adrenal gland. 10/9>> CT abdomen/pelvis: Heterogeneous complex cystic masses in the left upper quadrant, around the spleen, around the tail of the pancreas.  Significant microbiology data: 10/9>> COVID PCR: Positive 10/9>> influenza a/B: Negative 10/9>> blood culture: No growth 10/9>> urine culture: 30,000 colonies of Enterococcus 10/10>> pleural fluid culture: No growth  Procedures: 10/10>> thoracocentesis  Consults: IR CCS  Subjective: Afebrile overnight-feels much better today.  No abdominal pain.  Objective: Vitals: Blood pressure 100/72, pulse 61, temperature 98.7 F (37.1 C), temperature source Oral, resp. rate 18, height '6\' 6"'$  (1.981 m), weight 86.2 kg, SpO2 96 %.   Exam: Gen Exam:Alert awake-not in any distress HEENT:atraumatic, normocephalic Chest: B/L clear to auscultation anteriorly CVS:S1S2 regular Abdomen:soft non tender, non distended Extremities:no edema Neurology: Non focal Skin: no rash   Pertinent  Labs/Radiology:    Latest Ref Rng & Units 09/24/2022   12:21 AM 09/23/2022    6:30 AM 09/22/2022    5:33 PM  CBC  WBC 4.0 - 10.5 K/uL 8.3  13.0    Hemoglobin 13.0 - 17.0 g/dL 12.7  10.0  16.0   Hematocrit 39.0 - 52.0 % 37.9  30.5  47.0   Platelets 150 - 400 K/uL 276  280      Lab Results  Component Value Date   NA 134 (L) 09/24/2022   K 3.9 09/24/2022   CL 106 09/24/2022   CO2 21 (L) 09/24/2022      Assessment/Plan: COVID-19 PNA Likely causing fever/weakness on presentation  Much improved-on Remdesivir  Remains on room air-no indication for steroids  CRP going up-watch closely.  Transaminitis Likely due to COVID-19/Remdesivir use Watch closely If worsens-we will commence further work-up Hold statin-remains on amiodarone  Multiple cystic intra-abdominal masses Asymptomatic-no history of abdominal pain Appreciate CCS input-since asymptomatic-no intervention recommended. Doubt infection-as noted above-fever likely due to COVID-19 Possibly malignant in etiology-discussed with patient's oncologist Dr Becky Sax 10/10-supportive care for now-if no fever/COVID-19 symptoms improve-no need to pursue inpatient aspiration/biopsy.  Oncology will follow in the outpatient setting.  Left pleural effusion Either due to malignancy or from CHF Thoracocentesis on 10/10-consistent with exudate per lights criteria. Await cytology Cultures negative so far  Elevated D-dimer Due to COVID-19 infection CTA negative for PE On anticoagulation-we will keep on IV heparin until we are assured that patient does not require biopsy/aspiration of abdominal cystic masses.  Chronic combined HFpEF/HFrEF Volume status stable Continue Lasix/Entresto/Farxiga/metoprolol if BP permits.  PAF Maintaining sinus rhythm Continue metoprolol/amiodarone Eliquis on hold-on IV heparin-see above.   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 Resume Plavix when assured that patient does not need aspiration/biopsy of intra-abdominal cystic masses. No anginal symptoms.  History of metastatic colon cancer-s/p right hemicolectomy-complicated by intra-abdominal abscesses Follows with oncology Will need to discuss with Dr. Burr Medico prior to discharge-regarding above findings of cystic lesions intra-abdominal line.  Hypothyroidism Continue Synthroid  Seizure disorder Continue Keppra  BMI: Estimated body mass index is 21.96 kg/m as calculated from the following:   Height as of this encounter: '6\' 6"'$  (1.981 m).   Weight as of this encounter: 86.2 kg.   Code status:   Code Status: DNR   DVT Prophylaxis: SCDs Start: 09/22/22 2251   Family Communication: None at bedside   Disposition Plan: Status is: Inpatient Remains inpatient appropriate because: COVID-19 infection-weakness-not stable for discharge.   Planned Discharge Destination:Home health   Diet: Diet Order             Diet Heart Room service appropriate? Yes; Fluid consistency: Thin  Diet effective now                     Antimicrobial agents: Anti-infectives (From admission, onward)    Start     Dose/Rate Route Frequency Ordered Stop   09/23/22 1000  remdesivir 100 mg in sodium chloride 0.9 % 100 mL IVPB       See Hyperspace for full Linked Orders Report.   100 mg 200 mL/hr over 30 Minutes Intravenous Daily 09/22/22 2252 09/25/22 0959   09/23/22 0600  ceFEPIme (MAXIPIME) 2 g in sodium chloride 0.9 % 100 mL IVPB  Status:  Discontinued        2 g 200 mL/hr over 30 Minutes Intravenous Every 8 hours 09/22/22 1810 09/23/22 1004   09/23/22 0515  metroNIDAZOLE (FLAGYL) IVPB 500 mg  Status:  Discontinued        500 mg 100 mL/hr over 60 Minutes Intravenous Every 12 hours 09/22/22 2252 09/23/22 1004   09/23/22 0500  vancomycin (VANCOCIN) IVPB 1000  mg/200 mL premix  Status:  Discontinued        1,000 mg 200 mL/hr over 60 Minutes Intravenous Every 8 hours 09/22/22 1810 09/22/22 2252   09/22/22 2300  remdesivir 200 mg in sodium chloride 0.9% 250 mL IVPB       See Hyperspace for full Linked Orders Report.   200 mg 580 mL/hr over 30 Minutes Intravenous Once 09/22/22 2252 09/23/22 0427   09/22/22 2200  molnupiravir EUA (LAGEVRIO) capsule 800 mg  Status:  Discontinued        4 capsule Oral 2 times daily 09/22/22 2116 09/22/22 2136   09/22/22 1730  vancomycin (VANCOREADY) IVPB 2000 mg/400 mL        2,000 mg 200 mL/hr over 120 Minutes Intravenous  Once 09/22/22 1715 09/22/22 2232   09/22/22 1715  ceFEPIme (MAXIPIME) 2 g in sodium chloride 0.9 % 100 mL IVPB        2 g 200 mL/hr over 30 Minutes Intravenous  Once 09/22/22 1705 09/22/22 1805   09/22/22 1715  metroNIDAZOLE (FLAGYL) IVPB 500 mg        500 mg 100 mL/hr over 60 Minutes Intravenous  Once 09/22/22 1705 09/22/22 1837   09/22/22 1715  vancomycin (VANCOCIN) IVPB 1000 mg/200 mL premix  Status:  Discontinued  1,000 mg 200 mL/hr over 60 Minutes Intravenous  Once 09/22/22 1705 09/22/22 1715        MEDICATIONS: Scheduled Meds:  amiodarone  400 mg Oral Daily   atorvastatin  80 mg Oral QPM   dapagliflozin propanediol  10 mg Oral Daily   diclofenac Sodium  2 g Topical QID   furosemide  40 mg Oral Daily   levETIRAcetam  1,500 mg Oral BID   levothyroxine  50 mcg Oral Q0600   metoprolol succinate  50 mg Oral BID   pantoprazole  40 mg Oral Daily   sacubitril-valsartan  1 tablet Oral BID   sodium chloride flush  3 mL Intravenous Q12H   Continuous Infusions:  heparin     remdesivir 100 mg in sodium chloride 0.9 % 100 mL IVPB     PRN Meds:.acetaminophen, ALPRAZolam, guaiFENesin-dextromethorphan, oxyCODONE-acetaminophen, polyethylene glycol   I have personally reviewed following labs and imaging studies  LABORATORY DATA: CBC: Recent Labs  Lab 09/19/22 0908 09/22/22 1720  09/22/22 1732 09/22/22 1733 09/23/22 0630 09/24/22 0021  WBC 7.3 5.8  --   --  13.0* 8.3  NEUTROABS 5.8 4.9  --   --  12.0* 7.1  HGB 13.0 14.1 15.3 16.0 10.0* 12.7*  HCT 40.4 43.4 45.0 47.0 30.5* 37.9*  MCV 98.8 97.7  --   --  98.1 95.9  PLT 335 318  --   --  280 276     Basic Metabolic Panel: Recent Labs  Lab 09/19/22 0908 09/22/22 1720 09/22/22 1732 09/22/22 1733 09/23/22 0630 09/24/22 0021  NA 138 136 136 136 137 134*  K 4.5 4.0 4.1 4.1 3.5 3.9  CL 105 99  --  99 108 106  CO2 28 26  --   --  21* 21*  GLUCOSE 136* 108*  --  104* 117* 196*  BUN 16 13  --  '18 14 23  '$ CREATININE 0.93 1.15  --  1.00 0.95 1.16  CALCIUM 8.6* 8.6*  --   --  7.3* 8.1*  MG  --  2.2  --   --  1.7 2.4  PHOS  --   --   --   --  3.8  --      GFR: Estimated Creatinine Clearance: 79.5 mL/min (by C-G formula based on SCr of 1.16 mg/dL).  Liver Function Tests: Recent Labs  Lab 09/19/22 0908 09/22/22 1720 09/23/22 0630 09/24/22 0021  AST 32 56* 65* 138*  ALT 54* 61* 63* 113*  ALKPHOS 224* 208* 148* 153*  BILITOT 0.4 0.5 0.5 0.5  PROT 7.9 8.3* 6.3* 6.4*  ALBUMIN 3.4* 3.3* 2.5* 2.5*    No results for input(s): "LIPASE", "AMYLASE" in the last 168 hours. No results for input(s): "AMMONIA" in the last 168 hours.  Coagulation Profile: Recent Labs  Lab 09/22/22 1720  INR 1.5*     Cardiac Enzymes: Recent Labs  Lab 09/22/22 1720  CKTOTAL 147     BNP (last 3 results) No results for input(s): "PROBNP" in the last 8760 hours.  Lipid Profile: No results for input(s): "CHOL", "HDL", "LDLCALC", "TRIG", "CHOLHDL", "LDLDIRECT" in the last 72 hours.  Thyroid Function Tests: No results for input(s): "TSH", "T4TOTAL", "FREET4", "T3FREE", "THYROIDAB" in the last 72 hours.  Anemia Panel: No results for input(s): "VITAMINB12", "FOLATE", "FERRITIN", "TIBC", "IRON", "RETICCTPCT" in the last 72 hours.  Urine analysis:    Component Value Date/Time   COLORURINE YELLOW 09/22/2022 1850    APPEARANCEUR CLEAR 09/22/2022 1850   LABSPEC 1.011 09/22/2022  [place] 6.0 09/22/2022 1850   GLUCOSEU >=500 (A) 09/22/2022 1850   HGBUR NEGATIVE 09/22/2022 [place] 09/22/2022 [place] 09/22/2022 1850   PROTEINUR NEGATIVE 09/22/2022 1850   NITRITE NEGATIVE 09/22/2022 1850   LEUKOCYTESUR NEGATIVE 09/22/2022 1850    Sepsis Labs: Lactic Acid, Venous    Component Value Date/Time   LATICACIDVEN 1.6 09/22/2022 1720    MICROBIOLOGY: Recent Results (from the past 240 hour(s))  Urine Culture     Status: Abnormal (Preliminary result)   Collection Time: 09/22/22  4:57 PM   Specimen: In/Out Cath Urine  Result Value Ref Range Status   Specimen Description IN/OUT CATH URINE  Final   Special Requests NONE  Final   Culture (A)  Final    30,000 COLONIES/mL ENTEROCOCCUS FAECALIS SUSCEPTIBILITIES TO FOLLOW Performed at [facility], [place] [address], [place], [place] [address]    Report Status PENDING  Incomplete  Resp Panel by RT-PCR (Flu A&B, Covid) Anterior Nasal Swab     Status: Abnormal   Collection Time: 09/22/22  5:18 PM   Specimen: Anterior Nasal Swab  Result Value Ref Range Status   SARS Coronavirus 2 by RT PCR POSITIVE (A) NEGATIVE Final    Comment: (NOTE) SARS-CoV-2 target nucleic acids are DETECTED.  The SARS-CoV-2 RNA is generally detectable in upper respiratory specimens during the acute phase of infection. Positive results are indicative of the presence of the identified virus, but do not rule out bacterial infection or co-infection with other pathogens not detected by the test. Clinical correlation with patient history and other diagnostic information is necessary to determine patient infection status. The expected result is Negative.  Fact Sheet for Patients: EntrepreneurPulse.com.au  Fact Sheet for Healthcare Providers: IncredibleEmployment.be  This test is not yet approved or  cleared by the [place] FDA and  has been authorized for detection and/or diagnosis of SARS-CoV-2 by FDA under an Emergency Use Authorization (EUA).  This EUA will remain in effect (meaning this test can be used) for the duration of  the COVID-19 declaration under Section 564(b)(1) of the A ct, 21 U.S.C. section 360bbb-3(b)(1), unless the authorization is terminated or revoked sooner.     Influenza A by PCR NEGATIVE NEGATIVE Final   Influenza B by PCR NEGATIVE NEGATIVE Final    Comment: (NOTE) The Xpert Xpress SARS-CoV-2/FLU/RSV plus assay is intended as an aid in the diagnosis of influenza from Nasopharyngeal swab specimens and should not be used as a sole basis for treatment. Nasal washings and aspirates are unacceptable for Xpert Xpress SARS-CoV-2/FLU/RSV testing.  Fact Sheet for Patients: EntrepreneurPulse.com.au  Fact Sheet for Healthcare Providers: IncredibleEmployment.be  This test is not yet approved or cleared by the [place] FDA and has been authorized for detection and/or diagnosis of SARS-CoV-2 by FDA under an Emergency Use Authorization (EUA). This EUA will remain in effect (meaning this test can be used) for the duration of the COVID-19 declaration under Section 564(b)(1) of the Act, 21 U.S.C. section 360bbb-3(b)(1), unless the authorization is terminated or revoked.  Performed at [facility], [place] [address], [place], [place] [address]   Blood Culture (routine x 2)     Status: None (Preliminary result)   Collection Time: 09/22/22  5:20 PM   Specimen: BLOOD  Result Value Ref Range Status   Specimen Description BLOOD SITE NOT SPECIFIED  Final   Special Requests   Final    BOTTLES DRAWN AEROBIC AND ANAEROBIC Blood Culture  adequate volume   Culture   Final    NO GROWTH < 24 HOURS Performed at [facility], [place] [address], [place], [place] [address]    Report Status PENDING  Incomplete  Blood Culture  (routine x 2)     Status: None (Preliminary result)   Collection Time: 09/22/22  5:20 PM   Specimen: BLOOD  Result Value Ref Range Status   Specimen Description BLOOD SITE NOT SPECIFIED  Final   Special Requests   Final    BOTTLES DRAWN AEROBIC AND ANAEROBIC Blood Culture adequate volume   Culture   Final    NO GROWTH < 24 HOURS Performed at [facility], [place] [address], [place], [place] [address]    Report Status PENDING  Incomplete  Gram stain     Status: None   Collection Time: 09/23/22 10:42 AM   Specimen: Lung, Left; Pleural Fluid  Result Value Ref Range Status   Specimen Description PLEURAL  Final   Special Requests  LEFT LUNG  Final   Gram Stain   Final    NO WBC SEEN NO ORGANISMS SEEN Performed at [facility], [address], [place], [place] [address]    Report Status 09/23/2022 FINAL  Final  Culture, body fluid w Gram Stain-bottle     Status: None (Preliminary result)   Collection Time: 09/23/22 10:43 AM   Specimen: Pleura  Result Value Ref Range Status   Specimen Description PLEURAL  Final   Special Requests  LEGT LUNG  Final   Culture   Final    NO GROWTH <12 HOURS Performed at [facility], [place] [address] [place], [place] [address]    Report Status PENDING  Incomplete    RADIOLOGY STUDIES/RESULTS: IR THORACENTESIS ASP PLEURAL SPACE W/IMG GUIDE  Result Date: 09/23/2022 INDICATION: Shortness of breath, previous CTA chest showed large left pleural effusion. Request for therapeutic and diagnostic thoracentesis. EXAM: ULTRASOUND GUIDED LEFT THORACENTESIS MEDICATIONS: 10 mL 1% lidocaine COMPLICATIONS: None immediate. PROCEDURE: An ultrasound guided thoracentesis was thoroughly discussed with the patient and questions answered. The benefits, risks, alternatives and complications were also discussed. The patient understands and wishes to proceed with the procedure. Written consent was obtained. Ultrasound was performed to localize and mark an adequate  pocket of fluid in the left chest. The area was then prepped and draped in the normal sterile fashion. 1% Lidocaine was used for local anesthesia. Under ultrasound guidance a 6 Fr Safe-T-Centesis catheter was introduced. Thoracentesis was performed. The catheter was removed and a dressing applied. FINDINGS: A total of approximately 1.3 L of hazy brown fluid was removed. Samples were sent to the laboratory as requested by the clinical team. Post procedure chest X-ray reviewed, negative for pneumothorax. IMPRESSION: Successful ultrasound guided left thoracentesis yielding 1.3 L of pleural fluid. Read by: Durenda Guthrie, PA-C Electronically Signed   By: Corrie Mckusick D.O.   On: 09/23/2022 12:57   DG Chest Port 1 View  Result Date: 09/23/2022 CLINICAL DATA:  Status post left thoracentesis EXAM: PORTABLE CHEST 1 VIEW COMPARISON:  09/22/2022 FINDINGS: Interval reduction in volume of a left-sided pleural effusion, now small, with improved aeration of the left lung base. No pneumothorax. Cardiomegaly with left chest multi lead pacer defibrillator. Right lung is normally aerated. Osseous structures unremarkable. IMPRESSION: 1. Interval reduction in volume of a left-sided pleural effusion, now small, with improved aeration of the left lung base. No pneumothorax. 2. Cardiomegaly with left chest multi lead pacer defibrillator.  Electronically Signed   By: Delanna Ahmadi M.D.   On: 09/23/2022 11:28   CT Angio Chest PE W and/or Wo Contrast  Result Date: 09/22/2022 CLINICAL DATA:  Pulmonary embolus suspected with high probability. Progressive weakness since prior discharge on Thursday for cardiac arrest. Sepsis. Abdominal pain. EXAM: CT ANGIOGRAPHY CHEST CT ABDOMEN AND PELVIS WITH CONTRAST TECHNIQUE: Multidetector CT imaging of the chest was performed using the standard protocol during bolus administration of intravenous contrast. Multiplanar CT image reconstructions and MIPs were obtained to evaluate the vascular anatomy.  Multidetector CT imaging of the abdomen and pelvis was performed using the standard protocol during bolus administration of intravenous contrast. RADIATION DOSE REDUCTION: This exam was performed according to the departmental dose-optimization program which includes automated exposure control, adjustment of the mA and/or kV according to patient size and/or use of iterative reconstruction technique. CONTRAST:  57m OMNIPAQUE IOHEXOL 350 MG/ML SOLN COMPARISON:  CT chest 08/30/2022.  PET-CT 07/24/2022 FINDINGS: CTA CHEST FINDINGS Cardiovascular: Good opacification of the central and segmental pulmonary arteries. No focal filling defects. No evidence of significant pulmonary embolus. Cardiac enlargement. No pericardial effusions. Normal caliber thoracic aorta. Calcification of the aorta and coronary arteries. Mediastinum/Nodes: Thyroid gland is unremarkable. Esophagus is decompressed. No significant lymphadenopathy. Cardiac pacemaker. Lungs/Pleura: Large left pleural effusion. Atelectasis in both lung bases, greater on the left. Patchy infiltrates in the left upper lung and less prominent infiltrates in the right upper lung. Changes likely to represent multifocal pneumonia. Aspiration or edema would be less likely. Airways are patent. No pneumothorax. Musculoskeletal: Degenerative changes in the spine. No destructive bone lesions. Review of the MIP images confirms the above findings. CT ABDOMEN and PELVIS FINDINGS Hepatobiliary: Scattered subcentimeter low-attenuation lesions in the liver. These are similar to previous study. Likely metastatic. Stone in the gallbladder. No bile duct dilatation. Pancreas: Pancreatic parenchyma appears to be intact. There are multiloculated thick-walled heterogeneous cystic lesions demonstrated in the left upper quadrant, surrounding the tail of the pancreas and extending around the spleen into the subdiaphragmatic space. The largest of these measures about 7 x 9.3 cm. These are  increasing since the prior study and likely represent progressing metastatic disease although abscesses could also have this appearance. Spleen: Spleen is otherwise normal. Surgical clips in the left upper quadrant anterior to the spleen. Adrenals/Urinary Tract: Bilateral adrenal gland nodules are unchanged. 1.7 cm on the right and 1.5 cm on the left. Likely metastatic. Small bilateral renal cysts. Largest on the left measures 2.4 cm diameter. No imaging follow-up is indicated. Stomach/Bowel: Stomach, small bowel, and colon are not abnormally distended. Previous right hemicolectomy resection with ileocolonic anastomosis appearing patent. Vascular/Lymphatic: Calcification of the aorta. No aneurysm. Scattered lymph nodes are not pathologically enlarged. Reproductive: Prostate is unremarkable. Other: Small amount of free fluid in the abdomen and pelvis along the pericolic gutters. No free air. Abdominal wall musculature appears intact. Musculoskeletal: Degenerative changes.  No destructive bone lesions. Review of the MIP images confirms the above findings. IMPRESSION: 1. No evidence of significant pulmonary embolus. 2. Large left pleural effusion. Bilateral basilar atelectasis. Patchy infiltrates in the lungs, greater on the left. Changes likely due to multifocal pneumonia although edema or aspiration would be secondary considerations. 3. Presumed metastatic lesions in the liver and adrenal glands are unchanged. 4. Heterogeneous complex cystic masses demonstrated in the left upper quadrant, around the spleen, and around the tail of the pancreas have significantly enlarged since previous study, likely metastatic although abscesses could also have this appearance in the setting of  sepsis. 5. Small amount of free fluid in the abdomen and pelvis. 6. Cholelithiasis without evidence of acute cholecystitis. 7. Aortic atherosclerosis. Electronically Signed   By: Lucienne Capers M.D.   On: 09/22/2022 21:48   CT ABDOMEN  PELVIS W CONTRAST  Result Date: 09/22/2022 CLINICAL DATA:  Pulmonary embolus suspected with high probability. Progressive weakness since prior discharge on Thursday for cardiac arrest. Sepsis. Abdominal pain. EXAM: CT ANGIOGRAPHY CHEST CT ABDOMEN AND PELVIS WITH CONTRAST TECHNIQUE: Multidetector CT imaging of the chest was performed using the standard protocol during bolus administration of intravenous contrast. Multiplanar CT image reconstructions and MIPs were obtained to evaluate the vascular anatomy. Multidetector CT imaging of the abdomen and pelvis was performed using the standard protocol during bolus administration of intravenous contrast. RADIATION DOSE REDUCTION: This exam was performed according to the departmental dose-optimization program which includes automated exposure control, adjustment of the mA and/or kV according to patient size and/or use of iterative reconstruction technique. CONTRAST:  3m OMNIPAQUE IOHEXOL 350 MG/ML SOLN COMPARISON:  CT chest 08/30/2022.  PET-CT 07/24/2022 FINDINGS: CTA CHEST FINDINGS Cardiovascular: Good opacification of the central and segmental pulmonary arteries. No focal filling defects. No evidence of significant pulmonary embolus. Cardiac enlargement. No pericardial effusions. Normal caliber thoracic aorta. Calcification of the aorta and coronary arteries. Mediastinum/Nodes: Thyroid gland is unremarkable. Esophagus is decompressed. No significant lymphadenopathy. Cardiac pacemaker. Lungs/Pleura: Large left pleural effusion. Atelectasis in both lung bases, greater on the left. Patchy infiltrates in the left upper lung and less prominent infiltrates in the right upper lung. Changes likely to represent multifocal pneumonia. Aspiration or edema would be less likely. Airways are patent. No pneumothorax. Musculoskeletal: Degenerative changes in the spine. No destructive bone lesions. Review of the MIP images confirms the above findings. CT ABDOMEN and PELVIS FINDINGS  Hepatobiliary: Scattered subcentimeter low-attenuation lesions in the liver. These are similar to previous study. Likely metastatic. Stone in the gallbladder. No bile duct dilatation. Pancreas: Pancreatic parenchyma appears to be intact. There are multiloculated thick-walled heterogeneous cystic lesions demonstrated in the left upper quadrant, surrounding the tail of the pancreas and extending around the spleen into the subdiaphragmatic space. The largest of these measures about 7 x 9.3 cm. These are increasing since the prior study and likely represent progressing metastatic disease although abscesses could also have this appearance. Spleen: Spleen is otherwise normal. Surgical clips in the left upper quadrant anterior to the spleen. Adrenals/Urinary Tract: Bilateral adrenal gland nodules are unchanged. 1.7 cm on the right and 1.5 cm on the left. Likely metastatic. Small bilateral renal cysts. Largest on the left measures 2.4 cm diameter. No imaging follow-up is indicated. Stomach/Bowel: Stomach, small bowel, and colon are not abnormally distended. Previous right hemicolectomy resection with ileocolonic anastomosis appearing patent. Vascular/Lymphatic: Calcification of the aorta. No aneurysm. Scattered lymph nodes are not pathologically enlarged. Reproductive: Prostate is unremarkable. Other: Small amount of free fluid in the abdomen and pelvis along the pericolic gutters. No free air. Abdominal wall musculature appears intact. Musculoskeletal: Degenerative changes.  No destructive bone lesions. Review of the MIP images confirms the above findings. IMPRESSION: 1. No evidence of significant pulmonary embolus. 2. Large left pleural effusion. Bilateral basilar atelectasis. Patchy infiltrates in the lungs, greater on the left. Changes likely due to multifocal pneumonia although edema or aspiration would be secondary considerations. 3. Presumed metastatic lesions in the liver and adrenal glands are unchanged. 4.  Heterogeneous complex cystic masses demonstrated in the left upper quadrant, around the spleen, and around the tail of the  pancreas have significantly enlarged since previous study, likely metastatic although abscesses could also have this appearance in the setting of sepsis. 5. Small amount of free fluid in the abdomen and pelvis. 6. Cholelithiasis without evidence of acute cholecystitis. 7. Aortic atherosclerosis. Electronically Signed   By: Lucienne Capers M.D.   On: 09/22/2022 21:48   DG Chest Port 1 View  Result Date: 09/22/2022 CLINICAL DATA:  Questionable sepsis - evaluate for abnormality EXAM: PORTABLE CHEST 1 VIEW COMPARISON:  September 01, 2022 FINDINGS: The cardiomediastinal silhouette is unchanged in contour.LEFT chest AICD. No significant pneumothorax. Possible small LEFT pleural effusion. New LEFT retrocardiac opacity. Visualized abdomen is unremarkable. Advanced degenerative changes of the RIGHT shoulder. IMPRESSION: New LEFT retrocardiac opacity with possible small LEFT pleural effusion. Differential considerations include atelectasis versus infection. Electronically Signed   By: Valentino Saxon M.D.   On: 09/22/2022 17:34     LOS: 2 days   Oren Binet, MD  [facility]    To contact the attending provider between 7A-7P or the covering provider during after hours 7P-7A, please log into the web site [email or web address] and access using universal [place] password for that web site. If you do not have the password, please call the hospital operator.  09/24/2022, 10:16 AM

## 2022-09-24 NOTE — TOC Initial Note (Signed)
Transition of Care [facility]) - Initial/Assessment Note    Patient Details  Name: Gregory [name] MRN: [id number] Date of Birth: 1961-06-30  [facility] CM/SW Contact:    Cyndi Bender, RN Phone Number: 09/24/2022, 9:43 AM  Clinical Narrative:                 Patient discharged from [facility] 09/18/22.   Risk for readmission assessment completed. PTA patient was from home with his son and has support of both son and daughter. Patient does not drive and children provide transportation to appointments. Patient gets his medications without any issues.     TOC will continue to follow for needs.  Patient Goals and CMS Choice        Expected Discharge Plan and Services                                                Prior Living Arrangements/Services                       Activities of Daily Living      Permission Sought/Granted                  Emotional Assessment              Admission diagnosis:  Acute respiratory failure with hypoxia ([place]) [J96.01] Multifocal pneumonia [J18.9] COVID-19 virus infection [U07.1] COVID-19 [U07.1] Patient Active Problem List   Diagnosis Date Noted   COVID-19 virus infection 09/22/2022   Pleural effusion, left 09/22/2022   Intraabdominal mass 09/22/2022   Seizure ([place]) 09/11/2022   Protein-calorie malnutrition, severe 09/01/2022   Pressure injury of skin 08/31/2022   Cardiac arrest ([place]) 08/30/2022   Acute respiratory failure with hypoxia (HCC)    Status epilepticus ([place])    Postprocedural intraabdominal abscess 05/22/2022   Type 2 diabetes mellitus without complication, without long-term current use of insulin ([place]) 05/08/2022   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  05/08/2022   Ileus following gastrointestinal surgery ([place]) 05/08/2022   Hypothyroidism 05/08/2022   Leukocytosis 05/08/2022   Postoperative ileus ([place]) 04/22/2022   Cancer of right colon ([place])  04/17/2022   Benign neoplasm of colon    Anticoagulated    Symptomatic anemia/iron deficiency anemia 04/12/2022   ICD (implantable cardioverter-defibrillator) in place 03/21/2022   Slow transit constipation    Lipoma    Sleep disturbance    Hypoalbuminemia due to protein-calorie malnutrition ([place])    Seizures ([place])    Atrial fibrillation ([place])    Debility 09/29/2020   Shock ([place]) 09/13/2020   Thrombocytopenia ([place])    Localization-related idiopathic epilepsy and epileptic syndromes with seizures of localized onset, not intractable, without status epilepticus ([place]) 10/20/2018   Anemia 06/07/2018   Chronic combined systolic and diastolic congestive heart failure ([place]) 12/01/2013   CAD S/P percutaneous coronary angioplasty 12/01/2013   Erectile dysfunction 12/01/2013   Essential hypertension 12/01/2013   Hypercholesterolemia 12/01/2013   OSA on CPAP 12/01/2013   PCP:  Patient, No Pcp Per Pharmacy:   [facility], [place] - [address] AT [address] [place] [address] Phone: [phone] Fax: [place] [address] Phone: [phone] Fax: [phone]  Social Determinants of Health (SDOH) Interventions    Readmission Risk Interventions    09/04/2022   11:14 AM 04/22/2022   12:49 PM  Readmission Risk Prevention Plan  Transportation Screening Complete Complete  PCP or Specialist Appt within 3-5 Days  Complete  HRI or [place]  Complete  Social Work Consult for [place] Planning/Counseling  Complete  Palliative Care Screening  Not Applicable  Medication Review Press photographer) Complete Complete  PCP or Specialist appointment within 3-5 days of discharge Complete   HRI or [place] Complete   SW Recovery Care/Counseling Consult Complete   Palliative Care Screening Not [place]  Not Applicable

## 2022-09-24 NOTE — Progress Notes (Signed)
ANTICOAGULATION CONSULT NOTE - Initial Consult  Pharmacy Consult for Heparin Indication: atrial fibrillation  No Known Allergies  Patient Measurements: Height: '6\' 6"'$  (198.1 cm) Weight: 86.2 kg (190 lb) IBW/kg (Calculated) : 91.4  Vital Signs: Temp: 98.7 F (37.1 C) (10/11 0300) Temp Source: Oral (10/11 0300) BP: 99/66 (10/11 0300) Pulse Rate: 66 (10/11 0300)  Labs: Recent Labs    09/22/22 1720 09/22/22 1732 09/22/22 1733 09/22/22 2229 09/23/22 0630 09/24/22 0021  HGB 14.1   < > 16.0  --  10.0* 12.7*  HCT 43.4   < > 47.0  --  30.5* 37.9*  PLT 318  --   --   --  280 276  LABPROT 17.6*  --   --   --   --   --   INR 1.5*  --   --   --   --   --   CREATININE 1.15  --  1.00  --  0.95 1.16  CKTOTAL 147  --   --   --   --   --   TROPONINIHS 31*  --   --  27*  --   --    < > = values in this interval not displayed.    Estimated Creatinine Clearance: 79.5 mL/min (by C-G formula based on SCr of 1.16 mg/dL).   Medical History: Past Medical History:  Diagnosis Date   AICD (automatic cardioverter/defibrillator) present 2005   CAD (coronary artery disease) 12/01/2013   Cardiac arrest ([place])    Chronic combined systolic and diastolic CHF, NYHA class 1 ([place]) 12/01/2013   Erectile dysfunction 12/01/2013   HTN (hypertension) 12/01/2013   Hyperlipidemia 12/01/2013   Ischemic cardiomyopathy 12/01/2013   Presence of permanent cardiac pacemaker    Sleep apnea     Medications:  Scheduled:   amiodarone  400 mg Oral Daily   atorvastatin  80 mg Oral QPM   dapagliflozin propanediol  10 mg Oral Daily   diclofenac Sodium  2 g Topical QID   furosemide  40 mg Oral Daily   levETIRAcetam  1,500 mg Oral BID   levothyroxine  50 mcg Oral Q0600   metoprolol succinate  50 mg Oral BID   pantoprazole  40 mg Oral Daily   sacubitril-valsartan  1 tablet Oral BID   sodium chloride flush  3 mL Intravenous Q12H    Assessment: 61 y.o. male with h/o Afib, Eliquis on hold for possible biopsy,  for heparin.  Last Eliquis dose 10/9  Goal of Therapy:  aPTT 66-102 sec Heparin level 0.3-0.7 units/ml Monitor platelets by anticoagulation protocol: Yes   Plan:  After baseline labs drawn, start heparin 1350 units/hr Check aPTT in 8 hours   Ladon Heney, Bronson Curb 09/24/2022,7:15 AM

## 2022-09-24 NOTE — Progress Notes (Signed)
ANTICOAGULATION CONSULT NOTE   Pharmacy Consult for Heparin Indication: atrial fibrillation  No Known Allergies  Patient Measurements: Height: '6\' 6"'$  (198.1 cm) Weight: 86.2 kg (190 lb) IBW/kg (Calculated) : 91.4  Vital Signs: Temp: 98.3 F (36.8 C) (10/11 1200) Temp Source: Oral (10/11 1200) BP: 91/67 (10/11 1200) Pulse Rate: 61 (10/11 1200)  Labs: Recent Labs    09/22/22 1720 09/22/22 1732 09/22/22 1733 09/22/22 2229 09/23/22 0630 09/24/22 0021 09/24/22 0821 09/24/22 1956  HGB 14.1   < > 16.0  --  10.0* 12.7*  --   --   HCT 43.4   < > 47.0  --  30.5* 37.9*  --   --   PLT 318  --   --   --  280 276  --   --   APTT  --   --   --   --   --   --  39* 78*  LABPROT 17.6*  --   --   --   --   --   --   --   INR 1.5*  --   --   --   --   --   --   --   HEPARINUNFRC  --   --   --   --   --   --  0.53  --   CREATININE 1.15  --  1.00  --  0.95 1.16  --   --   CKTOTAL 147  --   --   --   --   --   --   --   TROPONINIHS 31*  --   --  27*  --   --   --   --    < > = values in this interval not displayed.     Estimated Creatinine Clearance: 79.5 mL/min (by C-G formula based on SCr of 1.16 mg/dL).   Medical History: Past Medical History:  Diagnosis Date   AICD (automatic cardioverter/defibrillator) present 2005   CAD (coronary artery disease) 12/01/2013   Cardiac arrest ([place])    Chronic combined systolic and diastolic CHF, NYHA class 1 ([place]) 12/01/2013   Erectile dysfunction 12/01/2013   HTN (hypertension) 12/01/2013   Hyperlipidemia 12/01/2013   Ischemic cardiomyopathy 12/01/2013   Presence of permanent cardiac pacemaker    Sleep apnea     Medications:  Scheduled:   amiodarone  400 mg Oral Daily   amoxicillin  250 mg Oral Q8H   dapagliflozin propanediol  10 mg Oral Daily   diclofenac Sodium  2 g Topical QID   [START ON 09/25/2022] furosemide  20 mg Oral Daily   levETIRAcetam  1,500 mg Oral BID   levothyroxine  50 mcg Oral Q0600   [START ON 09/25/2022]  metoprolol succinate  50 mg Oral Daily   pantoprazole  40 mg Oral Daily   sacubitril-valsartan  1 tablet Oral BID   sodium chloride flush  3 mL Intravenous Q12H    Assessment: 61 y.o. male with h/o Afib, Eliquis on hold for possible biopsy on heparin.  Last Eliquis dose 10/9. Baseline heparin level= 0.53 so will need to use aPTTs for heparin monitoring -aPTT 78 and at goal  Goal of Therapy:  aPTT 66-102 sec Heparin level 0.3-0.7 units/ml Monitor platelets by anticoagulation protocol: Yes   Plan:  -Continue heparin at 1350 units/hr -Daily heparin level, aPTT and CBC  Hildred Laser, PharmD Clinical Pharmacist **Pharmacist phone directory can now be found on [email or web address] ([email or web address]).  Listed under  [facility].

## 2022-09-25 DIAGNOSIS — I2510 Atherosclerotic heart disease of native coronary artery without angina pectoris: Secondary | ICD-10-CM | POA: Diagnosis not present

## 2022-09-25 DIAGNOSIS — I480 Paroxysmal atrial fibrillation: Secondary | ICD-10-CM | POA: Diagnosis not present

## 2022-09-25 DIAGNOSIS — U071 COVID-19: Secondary | ICD-10-CM | POA: Diagnosis not present

## 2022-09-25 DIAGNOSIS — I5042 Chronic combined systolic (congestive) and diastolic (congestive) heart failure: Secondary | ICD-10-CM | POA: Diagnosis not present

## 2022-09-25 LAB — CYTOLOGY - NON PAP

## 2022-09-25 LAB — CBC WITH DIFFERENTIAL/PLATELET
Abs Immature Granulocytes: 0.02 10*3/uL (ref 0.00–0.07)
Basophils Absolute: 0 10*3/uL (ref 0.0–0.1)
Basophils Relative: 0 %
Eosinophils Absolute: 0 10*3/uL (ref 0.0–0.5)
Eosinophils Relative: 0 %
HCT: 36.3 % — ABNORMAL LOW (ref 39.0–52.0)
Hemoglobin: 12 g/dL — ABNORMAL LOW (ref 13.0–17.0)
Immature Granulocytes: 0 %
Lymphocytes Relative: 17 %
Lymphs Abs: 1 10*3/uL (ref 0.7–4.0)
MCH: 32 pg (ref 26.0–34.0)
MCHC: 33.1 g/dL (ref 30.0–36.0)
MCV: 96.8 fL (ref 80.0–100.0)
Monocytes Absolute: 0.4 10*3/uL (ref 0.1–1.0)
Monocytes Relative: 6 %
Neutro Abs: 4.5 10*3/uL (ref 1.7–7.7)
Neutrophils Relative %: 77 %
Platelets: 240 10*3/uL (ref 150–400)
RBC: 3.75 MIL/uL — ABNORMAL LOW (ref 4.22–5.81)
RDW: 20.9 % — ABNORMAL HIGH (ref 11.5–15.5)
WBC: 5.8 10*3/uL (ref 4.0–10.5)
nRBC: 0 % (ref 0.0–0.2)

## 2022-09-25 LAB — COMPREHENSIVE METABOLIC PANEL
ALT: 147 U/L — ABNORMAL HIGH (ref 0–44)
AST: 148 U/L — ABNORMAL HIGH (ref 15–41)
Albumin: 2.4 g/dL — ABNORMAL LOW (ref 3.5–5.0)
Alkaline Phosphatase: 136 U/L — ABNORMAL HIGH (ref 38–126)
Anion gap: 10 (ref 5–15)
BUN: 18 mg/dL (ref 8–23)
CO2: 22 mmol/L (ref 22–32)
Calcium: 7.9 mg/dL — ABNORMAL LOW (ref 8.9–10.3)
Chloride: 104 mmol/L (ref 98–111)
Creatinine, Ser: 0.92 mg/dL (ref 0.61–1.24)
GFR, Estimated: >60 mL/min (ref >60)
Glucose, Bld: 160 mg/dL — ABNORMAL HIGH (ref 70–99)
Potassium: 4.3 mmol/L (ref 3.5–5.1)
Sodium: 136 mmol/L (ref 135–145)
Total Bilirubin: 0.5 mg/dL (ref 0.3–1.2)
Total Protein: 6.4 g/dL — ABNORMAL LOW (ref 6.5–8.1)

## 2022-09-25 LAB — URINE CULTURE: Culture: 30000 — AB

## 2022-09-25 LAB — CHOLESTEROL, BODY FLUID: Cholesterol, Fluid: 61 mg/dL

## 2022-09-25 LAB — C-REACTIVE PROTEIN: CRP: 4.9 mg/dL — ABNORMAL HIGH (ref <1.0)

## 2022-09-25 LAB — HEPARIN LEVEL (UNFRACTIONATED): Heparin Unfractionated: 0.55 IU/mL (ref 0.30–0.70)

## 2022-09-25 LAB — APTT: aPTT: 115 seconds — ABNORMAL HIGH (ref 24–36)

## 2022-09-25 MED ORDER — APIXABAN 5 MG PO TABS
5.0000 mg | ORAL_TABLET | Freq: Two times a day (BID) | ORAL | Status: DC
  Administered 2022-09-25 – 2022-09-26 (×3): 5 mg via ORAL
  Filled 2022-09-25 (×3): qty 1

## 2022-09-25 MED ORDER — CLOPIDOGREL BISULFATE 75 MG PO TABS
75.0000 mg | ORAL_TABLET | Freq: Every day | ORAL | Status: DC
  Administered 2022-09-25 – 2022-09-26 (×2): 75 mg via ORAL
  Filled 2022-09-25 (×2): qty 1

## 2022-09-25 NOTE — Progress Notes (Addendum)
ANTICOAGULATION CONSULT NOTE   Pharmacy Consult for Heparin Indication: atrial fibrillation  No Known Allergies  Patient Measurements: Height: '6\' 6"'$  (198.1 cm) Weight: 86.2 kg (190 lb) IBW/kg (Calculated) : 91.4  Vital Signs: Temp: 97.9 F (36.6 C) (10/12 0720) Temp Source: Oral (10/12 0720) BP: 111/81 (10/12 0720) Pulse Rate: 55 (10/12 0720)  Labs: Recent Labs    09/22/22 1720 09/22/22 1732 09/22/22 2229 09/23/22 0630 09/24/22 0021 09/24/22 0821 09/24/22 1956 09/25/22 0611 09/25/22 0835  HGB 14.1   < >  --  10.0* 12.Gregory*  --   --  12.0*  --   HCT 43.4   < >  --  30.5* 37.9*  --   --  36.3*  --   PLT 318  --   --  280 276  --   --  240  --   APTT  --   --   --   --   --  39* 78*  --  115*  LABPROT 17.6*  --   --   --   --   --   --   --   --   INR 1.5*  --   --   --   --   --   --   --   --   HEPARINUNFRC  --   --   --   --   --  0.53  --   --  0.55  CREATININE 1.15   < >  --  0.95 1.16  --   --  0.92  --   CKTOTAL 147  --   --   --   --   --   --   --   --   TROPONINIHS 31*  --  27*  --   --   --   --   --   --    < > = values in this interval not displayed.     Estimated Creatinine Clearance: 100.2 mL/min (by C-G formula based on SCr of 0.92 mg/dL).   Medical History: Past Medical History:  Diagnosis Date   AICD (automatic cardioverter/defibrillator) present 2005   CAD (coronary artery disease) 12/01/2013   Cardiac arrest ([place])    Chronic combined systolic and diastolic CHF, NYHA class 1 ([place]) 12/01/2013   Erectile dysfunction 12/01/2013   HTN (hypertension) 12/01/2013   Hyperlipidemia 12/01/2013   Ischemic cardiomyopathy 12/01/2013   Presence of permanent cardiac pacemaker    Sleep apnea     Medications:  Scheduled:   amiodarone  400 mg Oral Daily   amoxicillin  250 mg Oral Q8H   dapagliflozin propanediol  10 mg Oral Daily   diclofenac Sodium  2 g Topical QID   furosemide  20 mg Oral Daily   levETIRAcetam  1,500 mg Oral BID   levothyroxine  50  mcg Oral Q0600   metoprolol succinate  50 mg Oral Daily   pantoprazole  40 mg Oral Daily   sacubitril-valsartan  1 tablet Oral BID   sodium chloride flush  3 mL Intravenous Q12H    Assessment: 61 y.o. Gregory [name] with h/o Afib, Eliquis on hold for possible biopsy on heparin.  Last Eliquis dose 10/9. Baseline heparin level= 0.53 so will need to use aPTTs for heparin monitoring -aPTT 115 and above goal  Goal of Therapy:  aPTT 66-102 sec Heparin level 0.3-0.Gregory units/ml Monitor platelets by anticoagulation protocol: Yes   Plan:  -Decrease heparin to 1150 units/hr -aPTT in 6hrs  Gregory Gregory [name]  Gregory Gregory [name], PharmD Clinical Pharmacist **Pharmacist phone directory can now be found on [email or web address] ([email or web address]).  Listed under [place].   Addendum -no procedures planned at this time, plans to change back to apixaban  Plan -Stop heparin -apixaban '10mg'$  po bid  Gregory Gregory [name], PharmD Clinical Pharmacist **Pharmacist phone directory can now be found on [email or web address] ([email or web address]).  Listed under [place].

## 2022-09-25 NOTE — Plan of Care (Signed)
Exit care note COVID given

## 2022-09-25 NOTE — Progress Notes (Signed)
PROGRESS NOTE        PATIENT DETAILS Name: Gregory [name] Age: 61 y.o. Sex: male Date of Birth: 1961/09/07 Admit Date: 09/22/2022 Admitting Physician Vianne Bulls, MD DTO:[email or web address], No Pcp Per  Brief Summary: Patient is a 61 y.o.  male with history of chronic combined HFpEF/HFrEF, EF, CAD, seizure disorder, metastatic colon cancer, recent PEA cardiac arrest in the setting of aspiration following seizure-just discharged from inpatient rehab/CIR on 10/5-presenting with 2-3 history of weakness, cough, fever-found to have COVID-19 infection.  See below for further details.  Significant events: 916-9/28>> hospitalization for PEA arrest in the setting of 6 aspiration/seizure.  Discharged to CIR 9/28-10/5>> at CIR 10/9>> admit to [facility]-weakness-COVID-19 infection.  Significant studies: 10/9>> CT angio chest: No PE, large left pleural effusion, patchy infiltrates bilaterally.  Presumed metastatic lesions in liver/adrenal gland. 10/9>> CT abdomen/pelvis: Heterogeneous complex cystic masses in the left upper quadrant, around the spleen, around the tail of the pancreas.  Significant microbiology data: 10/9>> COVID PCR: Positive 10/9>> influenza a/B: Negative 10/9>> blood culture: No growth 10/9>> urine culture: 30,000 colonies of Enterococcus 10/10>> pleural fluid culture: No growth  Procedures: 10/10>> thoracocentesis  Consults: IR CCS  Subjective: Feels much better-no abdominal pain, nausea or vomiting.  Objective: Vitals: Blood pressure 103/71, pulse 65, temperature 97.9 F (36.6 C), temperature source Oral, resp. rate 17, height '6\' 6"'$  (1.981 m), weight 86.2 kg, SpO2 92 %.   Exam: Gen Exam:Alert awake-not in any distress HEENT:atraumatic, normocephalic Chest: B/L clear to auscultation anteriorly CVS:S1S2 regular Abdomen:soft non tender, non distended Extremities:no edema Neurology: Non focal Skin: no rash    Pertinent  Labs/Radiology:    Latest Ref Rng & Units 09/25/2022    6:11 AM 09/24/2022   12:21 AM 09/23/2022    6:30 AM  CBC  WBC 4.0 - 10.5 K/uL 5.8  8.3  13.0   Hemoglobin 13.0 - 17.0 g/dL 12.0  12.7  10.0   Hematocrit 39.0 - 52.0 % 36.3  37.9  30.5   Platelets 150 - 400 K/uL 240  276  280     Lab Results  Component Value Date   NA 136 09/25/2022   K 4.3 09/25/2022   CL 104 09/25/2022   CO2 22 09/25/2022      Assessment/Plan: COVID-19 PNA Likely causing fever/weakness on presentation  Overall much better-remains on room air  We will complete IV Remdesivir today  No hypoxia-no role for steroids-continue to follow closely.  Transaminitis Likely due to COVID-19/Remdesivir use/underlying liver mets. Continue to watch closely-still uptrending Continue to hold statins for now-remains on amiodarone. Follow LFTs.  Multiple cystic intra-abdominal masses Asymptomatic-no history of abdominal pain Appreciate CCS input-since asymptomatic-no intervention recommended. Doubt infection-symptoms on presentation with fever likely due to COVID-19 infection. Possibly malignant in etiology-discussed with patient's oncologist Dr Becky Sax 10/10-supportive care for now-if no fever/COVID-19 symptoms improve-no need to pursue inpatient aspiration/biopsy.  Oncology will follow in the outpatient setting.  Left pleural effusion Either due to malignancy or from CHF Thoracocentesis on 10/10-consistent with exudate per lights criteria. Await cytology Cultures negative so far  Elevated D-dimer Due to COVID-19 infection CTA negative for PE On anticoagulation with IV heparin-in case patient requires cyst aspiration/biopsy-but clinically improved-hence will switch back to Eliquis.  Chronic combined HFpEF/HFrEF Volume status stable Continue Lasix/Entresto/Farxiga/metoprolol if BP permits.  PAF Maintaining sinus rhythm Continue metoprolol/amiodarone IV heparin-do not think  he is going to require biopsy-he is  clinically improved. Resume Eliquis.  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 Resume Plavix today.  History of metastatic colon cancer-s/p right hemicolectomy-complicated by intra-abdominal abscesses Follows with oncology Will need to discuss with Dr. Burr Medico prior to discharge-regarding above findings of cystic lesions intra-abdominal line.  Hypothyroidism Continue Synthroid  Seizure disorder Continue Keppra  BMI: Estimated body mass index is 21.96 kg/m as calculated from the following:   Height as of this encounter: '6\' 6"'$  (1.981 m).   Weight as of this encounter: 86.2 kg.   Code status:   Code Status: DNR   DVT Prophylaxis: SCDs Start: 09/22/22 2251   Family Communication: None at bedside   Disposition Plan: Status is: Inpatient Remains inpatient appropriate because: COVID-19 infection-weakness-not stable for discharge.   Planned Discharge Destination:Home health likely 10/13 if clinical improvement continues.   Diet: Diet Order             Diet Heart Room service appropriate? Yes; Fluid consistency: Thin  Diet effective now                     Antimicrobial agents: Anti-infectives (From admission, onward)    Start     Dose/Rate Route Frequency Ordered Stop   09/25/22 1000  remdesivir 100 mg in sodium chloride 0.9 % 100 mL IVPB        100 mg 200 mL/hr over 30 Minutes Intravenous Daily 09/24/22 1215 09/26/22 0959   09/24/22 1400  amoxicillin (AMOXIL) capsule 250 mg        250 mg Oral Every 8 hours 09/24/22 1216     09/23/22 1000  remdesivir 100 mg in sodium chloride 0.9 % 100 mL IVPB       See Hyperspace for full Linked Orders Report.   100 mg 200 mL/hr over 30 Minutes Intravenous Daily 09/22/22 2252 09/25/22 0959   09/23/22 0600  ceFEPIme (MAXIPIME) 2 g in sodium  chloride 0.9 % 100 mL IVPB  Status:  Discontinued        2 g 200 mL/hr over 30 Minutes Intravenous Every 8 hours 09/22/22 1810 09/23/22 1004   09/23/22 0515  metroNIDAZOLE (FLAGYL) IVPB 500 mg  Status:  Discontinued        500 mg 100 mL/hr over 60 Minutes Intravenous Every 12 hours 09/22/22 2252 09/23/22 1004   09/23/22 0500  vancomycin (VANCOCIN) IVPB 1000 mg/200 mL premix  Status:  Discontinued        1,000 mg 200 mL/hr over 60 Minutes Intravenous Every 8 hours 09/22/22 1810 09/22/22 2252   09/22/22 2300  remdesivir 200 mg in sodium chloride 0.9% 250 mL IVPB       See Hyperspace for full Linked Orders Report.   200 mg 580 mL/hr over 30 Minutes Intravenous Once 09/22/22 2252 09/23/22 0427   09/22/22 2200  molnupiravir EUA (LAGEVRIO) capsule 800 mg  Status:  Discontinued        4 capsule Oral 2 times daily 09/22/22 2116 09/22/22 2136   09/22/22 1730  vancomycin (VANCOREADY) IVPB 2000 mg/400 mL        2,000 mg 200 mL/hr over 120 Minutes Intravenous  Once 09/22/22 1715 09/22/22 2232   09/22/22 1715  ceFEPIme (MAXIPIME) 2 g in sodium chloride 0.9 % 100 mL IVPB  2 g 200 mL/hr over 30 Minutes Intravenous  Once 09/22/22 1705 09/22/22 1805   09/22/22 1715  metroNIDAZOLE (FLAGYL) IVPB 500 mg        500 mg 100 mL/hr over 60 Minutes Intravenous  Once 09/22/22 1705 09/22/22 1837   09/22/22 1715  vancomycin (VANCOCIN) IVPB 1000 mg/200 mL premix  Status:  Discontinued        1,000 mg 200 mL/hr over 60 Minutes Intravenous  Once 09/22/22 1705 09/22/22 1715        MEDICATIONS: Scheduled Meds:  amiodarone  400 mg Oral Daily   amoxicillin  250 mg Oral Q8H   dapagliflozin propanediol  10 mg Oral Daily   diclofenac Sodium  2 g Topical QID   furosemide  20 mg Oral Daily   levETIRAcetam  1,500 mg Oral BID   levothyroxine  50 mcg Oral Q0600   metoprolol succinate  50 mg Oral Daily   pantoprazole  40 mg Oral Daily   sacubitril-valsartan  1 tablet Oral BID   sodium chloride flush  3 mL  Intravenous Q12H   Continuous Infusions:  heparin 1,150 Units/hr (09/25/22 1003)   remdesivir 100 mg in sodium chloride 0.9 % 100 mL IVPB     PRN Meds:.acetaminophen, ALPRAZolam, guaiFENesin-dextromethorphan, oxyCODONE-acetaminophen, polyethylene glycol   I have personally reviewed following labs and imaging studies  LABORATORY DATA: CBC: Recent Labs  Lab 09/19/22 0908 09/22/22 1720 09/22/22 1732 09/22/22 1733 09/23/22 0630 09/24/22 0021 09/25/22 0611  WBC 7.3 5.8  --   --  13.0* 8.3 5.8  NEUTROABS 5.8 4.9  --   --  12.0* 7.1 4.5  HGB 13.0 14.1 15.3 16.0 10.0* 12.7* 12.0*  HCT 40.4 43.4 45.0 47.0 30.5* 37.9* 36.3*  MCV 98.8 97.7  --   --  98.1 95.9 96.8  PLT 335 318  --   --  280 276 240     Basic Metabolic Panel: Recent Labs  Lab 09/19/22 0908 09/22/22 1720 09/22/22 1732 09/22/22 1733 09/23/22 0630 09/24/22 0021 09/25/22 0611  NA 138 136 136 136 137 134* 136  K 4.5 4.0 4.1 4.1 3.5 3.9 4.3  CL 105 99  --  99 108 106 104  CO2 28 26  --   --  21* 21* 22  GLUCOSE 136* 108*  --  104* 117* 196* 160*  BUN 16 13  --  '18 14 23 18  '$ CREATININE 0.93 1.15  --  1.00 0.95 1.16 0.92  CALCIUM 8.6* 8.6*  --   --  7.3* 8.1* 7.9*  MG  --  2.2  --   --  1.7 2.4  --   PHOS  --   --   --   --  3.8  --   --      GFR: Estimated Creatinine Clearance: 100.2 mL/min (by C-G formula based on SCr of 0.92 mg/dL).  Liver Function Tests: Recent Labs  Lab 09/19/22 0908 09/22/22 1720 09/23/22 0630 09/24/22 0021 09/25/22 0611  AST 32 56* 65* 138* 148*  ALT 54* 61* 63* 113* 147*  ALKPHOS 224* 208* 148* 153* 136*  BILITOT 0.4 0.5 0.5 0.5 0.5  PROT 7.9 8.3* 6.3* 6.4* 6.4*  ALBUMIN 3.4* 3.3* 2.5* 2.5* 2.4*    No results for input(s): "LIPASE", "AMYLASE" in the last 168 hours. No results for input(s): "AMMONIA" in the last 168 hours.  Coagulation Profile: Recent Labs  Lab 09/22/22 1720  INR 1.5*     Cardiac Enzymes: Recent Labs  Lab 09/22/22 1720  CKTOTAL 147      BNP (last 3 results) No results for input(s): "PROBNP" in the last 8760 hours.  Lipid Profile: No results for input(s): "CHOL", "HDL", "LDLCALC", "TRIG", "CHOLHDL", "LDLDIRECT" in the last 72 hours.  Thyroid Function Tests: No results for input(s): "TSH", "T4TOTAL", "FREET4", "T3FREE", "THYROIDAB" in the last 72 hours.  Anemia Panel: No results for input(s): "VITAMINB12", "FOLATE", "FERRITIN", "TIBC", "IRON", "RETICCTPCT" in the last 72 hours.  Urine analysis:    Component Value Date/Time   COLORURINE YELLOW 09/22/2022 1850   APPEARANCEUR CLEAR 09/22/2022 1850   LABSPEC 1.011 09/22/2022 1850   PHURINE 6.0 09/22/2022 1850   GLUCOSEU >=500 (A) 09/22/2022 1850   HGBUR NEGATIVE 09/22/2022 1850   BILIRUBINUR NEGATIVE 09/22/2022 1850   KETONESUR NEGATIVE 09/22/2022 1850   PROTEINUR NEGATIVE 09/22/2022 1850   NITRITE NEGATIVE 09/22/2022 1850   LEUKOCYTESUR NEGATIVE 09/22/2022 1850    Sepsis Labs: Lactic Acid, Venous    Component Value Date/Time   LATICACIDVEN 1.6 09/22/2022 1720    MICROBIOLOGY: Recent Results (from the past 240 hour(s))  Urine Culture     Status: Abnormal   Collection Time: 09/22/22  4:57 PM   Specimen: In/Out Cath Urine  Result Value Ref Range Status   Specimen Description IN/OUT CATH URINE  Final   Special Requests   Final    NONE Performed at [facility], [place] [address], [place], [place] [address]    Culture 30,000 COLONIES/mL ENTEROCOCCUS FAECALIS (A)  Final   Report Status 09/25/2022 FINAL  Final   Organism ID, Bacteria ENTEROCOCCUS FAECALIS (A)  Final      Susceptibility   Enterococcus faecalis - MIC*    AMPICILLIN <=2 SENSITIVE Sensitive     NITROFURANTOIN <=16 SENSITIVE Sensitive     VANCOMYCIN 1 SENSITIVE Sensitive     * 30,000 COLONIES/mL ENTEROCOCCUS FAECALIS  Resp Panel by RT-PCR (Flu A&B, Covid) Anterior Nasal Swab     Status: Abnormal   Collection Time: 09/22/22  5:18 PM   Specimen: Anterior Nasal Swab  Result Value Ref  Range Status   SARS Coronavirus 2 by RT PCR POSITIVE (A) NEGATIVE Final    Comment: (NOTE) SARS-CoV-2 target nucleic acids are DETECTED.  The SARS-CoV-2 RNA is generally detectable in upper respiratory specimens during the acute phase of infection. Positive results are indicative of the presence of the identified virus, but do not rule out bacterial infection or co-infection with other pathogens not detected by the test. Clinical correlation with patient history and other diagnostic information is necessary to determine patient infection status. The expected result is Negative.  Fact Sheet for Patients: EntrepreneurPulse.com.au  Fact Sheet for Healthcare Providers: IncredibleEmployment.be  This test is not yet approved or cleared by the [place] FDA and  has been authorized for detection and/or diagnosis of SARS-CoV-2 by FDA under an Emergency Use Authorization (EUA).  This EUA will remain in effect (meaning this test can be used) for the duration of  the COVID-19 declaration under Section 564(b)(1) of the A ct, 21 U.S.C. section 360bbb-3(b)(1), unless the authorization is terminated or revoked sooner.     Influenza A by PCR NEGATIVE NEGATIVE Final   Influenza B by PCR NEGATIVE NEGATIVE Final    Comment: (NOTE) The Xpert Xpress SARS-CoV-2/FLU/RSV plus assay is intended as an aid in the diagnosis of influenza from Nasopharyngeal swab specimens and should not be used as a sole basis for treatment. Nasal washings and aspirates are unacceptable for Xpert Xpress SARS-CoV-2/FLU/RSV testing.  Fact Sheet  for Patients: EntrepreneurPulse.com.au  Fact Sheet for Healthcare Providers: IncredibleEmployment.be  This test is not yet approved or cleared by the [place] FDA and has been authorized for detection and/or diagnosis of SARS-CoV-2 by FDA under an Emergency Use Authorization (EUA). This EUA will  remain in effect (meaning this test can be used) for the duration of the COVID-19 declaration under Section 564(b)(1) of the Act, 21 U.S.C. section 360bbb-3(b)(1), unless the authorization is terminated or revoked.  Performed at [name], [place] [address] [place], [place] [address]   Blood Culture (routine x 2)     Status: None (Preliminary result)   Collection Time: 09/22/22  5:20 PM   Specimen: BLOOD  Result Value Ref Range Status   Specimen Description BLOOD SITE NOT SPECIFIED  Final   Special Requests   Final    BOTTLES DRAWN AEROBIC AND ANAEROBIC Blood Culture adequate volume   Culture   Final    NO GROWTH 2 DAYS Performed at [facility], [place] [address], [place], [place] [address]    Report Status PENDING  Incomplete  Blood Culture (routine x 2)     Status: None (Preliminary result)   Collection Time: 09/22/22  5:20 PM   Specimen: BLOOD  Result Value Ref Range Status   Specimen Description BLOOD SITE NOT SPECIFIED  Final   Special Requests   Final    BOTTLES DRAWN AEROBIC AND ANAEROBIC Blood Culture adequate volume   Culture   Final    NO GROWTH 2 DAYS Performed at [facility], [address] [place], [place] [address]    Report Status PENDING  Incomplete  Gram stain     Status: None   Collection Time: 09/23/22 10:42 AM   Specimen: Lung, Left; Pleural Fluid  Result Value Ref Range Status   Specimen Description PLEURAL  Final   Special Requests  LEFT LUNG  Final   Gram Stain   Final    NO WBC SEEN NO ORGANISMS SEEN Performed at [facility], [address], [place], [place] [address]    Report Status 09/23/2022 FINAL  Final  Culture, body fluid w Gram Stain-bottle     Status: None (Preliminary result)   Collection Time: 09/23/22 10:43 AM   Specimen: Pleura  Result Value Ref Range Status   Specimen Description PLEURAL  Final   Special Requests  LEGT LUNG  Final   Culture   Final    NO GROWTH 1 DAY Performed at [facility], [place] [address] [place], [place] [address]    Report Status PENDING  Incomplete    RADIOLOGY STUDIES/RESULTS: IR THORACENTESIS ASP PLEURAL SPACE W/IMG GUIDE  Result Date: 09/23/2022 INDICATION: Shortness of breath, previous CTA chest showed large left pleural effusion. Request for therapeutic and diagnostic thoracentesis. EXAM: ULTRASOUND GUIDED LEFT THORACENTESIS MEDICATIONS: 10 mL 1% lidocaine COMPLICATIONS: None immediate. PROCEDURE: An ultrasound guided thoracentesis was thoroughly discussed with the patient and questions answered. The benefits, risks, alternatives and complications were also discussed. The patient understands and wishes to proceed with the procedure. Written consent was obtained. Ultrasound was performed to localize and mark an adequate pocket of fluid in the left chest. The area was then prepped and draped in the normal sterile fashion. 1% Lidocaine was used for local anesthesia. Under ultrasound guidance a 6 Fr Safe-T-Centesis catheter was introduced. Thoracentesis was performed. The catheter was removed and a dressing applied. FINDINGS: A total of approximately 1.3 L of hazy brown  fluid was removed. Samples were sent to the laboratory as requested by the clinical team. Post procedure chest X-ray reviewed, negative for pneumothorax. IMPRESSION: Successful ultrasound guided left thoracentesis yielding 1.3 L of pleural fluid. Read by: Durenda Guthrie, PA-C Electronically Signed   By: Corrie Mckusick D.O.   On: 09/23/2022 12:57   DG Chest Port 1 View  Result Date: 09/23/2022 CLINICAL DATA:  Status post left thoracentesis EXAM: PORTABLE CHEST 1 VIEW COMPARISON:  09/22/2022 FINDINGS: Interval reduction in volume of a left-sided pleural effusion, now small, with improved aeration of the left lung base. No pneumothorax. Cardiomegaly with left chest multi lead pacer defibrillator. Right lung is normally aerated. Osseous structures unremarkable. IMPRESSION: 1. Interval reduction in volume of a  left-sided pleural effusion, now small, with improved aeration of the left lung base. No pneumothorax. 2. Cardiomegaly with left chest multi lead pacer defibrillator. Electronically Signed   By: Delanna Ahmadi M.D.   On: 09/23/2022 11:28     LOS: 3 days   Oren Binet, MD  [facility]    To contact the attending provider between 7A-7P or the covering provider during after hours 7P-7A, please log into the web site [email or web address] and access using universal  password for that web site. If you do not have the password, please call the hospital operator.  09/25/2022, 10:24 AM

## 2022-09-26 ENCOUNTER — Encounter: Payer: Self-pay | Admitting: Hematology

## 2022-09-26 ENCOUNTER — Other Ambulatory Visit: Payer: Self-pay | Admitting: Hematology

## 2022-09-26 ENCOUNTER — Other Ambulatory Visit (HOSPITAL_COMMUNITY): Payer: Self-pay

## 2022-09-26 DIAGNOSIS — I480 Paroxysmal atrial fibrillation: Secondary | ICD-10-CM | POA: Diagnosis not present

## 2022-09-26 DIAGNOSIS — J9601 Acute respiratory failure with hypoxia: Secondary | ICD-10-CM | POA: Diagnosis not present

## 2022-09-26 DIAGNOSIS — I5042 Chronic combined systolic (congestive) and diastolic (congestive) heart failure: Secondary | ICD-10-CM | POA: Diagnosis not present

## 2022-09-26 DIAGNOSIS — U071 COVID-19: Secondary | ICD-10-CM | POA: Diagnosis not present

## 2022-09-26 LAB — CBC WITH DIFFERENTIAL/PLATELET
Abs Immature Granulocytes: 0.02 10*3/uL (ref 0.00–0.07)
Basophils Absolute: 0 10*3/uL (ref 0.0–0.1)
Basophils Relative: 0 %
Eosinophils Absolute: 0 10*3/uL (ref 0.0–0.5)
Eosinophils Relative: 1 %
HCT: 36.7 % — ABNORMAL LOW (ref 39.0–52.0)
Hemoglobin: 12.3 g/dL — ABNORMAL LOW (ref 13.0–17.0)
Immature Granulocytes: 0 %
Lymphocytes Relative: 18 %
Lymphs Abs: 1.1 10*3/uL (ref 0.7–4.0)
MCH: 32 pg (ref 26.0–34.0)
MCHC: 33.5 g/dL (ref 30.0–36.0)
MCV: 95.6 fL (ref 80.0–100.0)
Monocytes Absolute: 0.4 10*3/uL (ref 0.1–1.0)
Monocytes Relative: 7 %
Neutro Abs: 4.4 10*3/uL (ref 1.7–7.7)
Neutrophils Relative %: 74 %
Platelets: 231 10*3/uL (ref 150–400)
RBC: 3.84 MIL/uL — ABNORMAL LOW (ref 4.22–5.81)
RDW: 20.4 % — ABNORMAL HIGH (ref 11.5–15.5)
WBC: 5.9 10*3/uL (ref 4.0–10.5)
nRBC: 0 % (ref 0.0–0.2)

## 2022-09-26 LAB — COMPREHENSIVE METABOLIC PANEL
ALT: 103 U/L — ABNORMAL HIGH (ref 0–44)
AST: 66 U/L — ABNORMAL HIGH (ref 15–41)
Albumin: 2.4 g/dL — ABNORMAL LOW (ref 3.5–5.0)
Alkaline Phosphatase: 142 U/L — ABNORMAL HIGH (ref 38–126)
Anion gap: 5 (ref 5–15)
BUN: 15 mg/dL (ref 8–23)
CO2: 24 mmol/L (ref 22–32)
Calcium: 7.8 mg/dL — ABNORMAL LOW (ref 8.9–10.3)
Chloride: 107 mmol/L (ref 98–111)
Creatinine, Ser: 0.74 mg/dL (ref 0.61–1.24)
GFR, Estimated: >60 mL/min (ref >60)
Glucose, Bld: 136 mg/dL — ABNORMAL HIGH (ref 70–99)
Potassium: 4.1 mmol/L (ref 3.5–5.1)
Sodium: 136 mmol/L (ref 135–145)
Total Bilirubin: 0.5 mg/dL (ref 0.3–1.2)
Total Protein: 6.2 g/dL — ABNORMAL LOW (ref 6.5–8.1)

## 2022-09-26 LAB — C-REACTIVE PROTEIN: CRP: 3.7 mg/dL — ABNORMAL HIGH (ref <1.0)

## 2022-09-26 MED ORDER — POLYETHYLENE GLYCOL 3350 17 G PO PACK: 17.0000 g | PACK | Freq: Every day | ORAL | Status: DC | PRN

## 2022-09-26 MED ORDER — AMOXICILLIN 250 MG PO CAPS
250.0000 mg | ORAL_CAPSULE | Freq: Three times a day (TID) | ORAL | 0 refills | Status: AC
  Filled 2022-09-26: qty 9, 3d supply, fill #0

## 2022-09-26 MED ORDER — SACUBITRIL-VALSARTAN 49-51 MG PO TABS
1.0000 | ORAL_TABLET | Freq: Two times a day (BID) | ORAL | 1 refills | Status: DC
  Filled 2022-09-26: qty 60, 30d supply, fill #0

## 2022-09-26 NOTE — Discharge Summary (Signed)
PATIENT DETAILS Name: Gregory [name] Age: 61 y.o. Sex: male Date of Birth: May 30, 1961 MRN: [id number]. Admitting Physician: Vianne Bulls, MD WHQ:[id number], No Pcp Per  Admit Date: 09/22/2022 Discharge date: 09/26/2022  Recommendations for Outpatient Follow-up:  Follow up with PCP in 1-2 weeks Please obtain CMP/CBC in one week Please ensure follow-up with oncology  Admitted From:  Home  Disposition: Home health   Discharge Condition: fair  CODE STATUS:   Code Status: DNR   Diet recommendation:  Diet Order             Diet - low sodium heart healthy           Diet Heart Room service appropriate? Yes; Fluid consistency: Thin  Diet effective now                    Brief Summary: Patient is a 61 y.o.  male with history of chronic combined HFpEF/HFrEF, EF, CAD, seizure disorder, metastatic colon cancer, recent PEA cardiac arrest in the setting of aspiration following seizure-just discharged from inpatient rehab/CIR on 10/5-presenting with 2-3 history of weakness, cough, fever-found to have COVID-19 infection.  See below for further details.   Significant events: 916-9/28>> hospitalization for PEA arrest in the setting of 6 aspiration/seizure.  Discharged to CIR 9/28-10/5>> at CIR 10/9>> admit to [facility]-weakness-COVID-19 infection.   Significant studies: 10/9>> CT angio chest: No PE, large left pleural effusion, patchy infiltrates bilaterally.  Presumed metastatic lesions in liver/adrenal gland. 10/9>> CT abdomen/pelvis: Heterogeneous complex cystic masses in the left upper quadrant, around the spleen, around the tail of the pancreas.   Significant microbiology data: 10/9>> COVID PCR: Positive 10/9>> influenza a/B: Negative 10/9>> blood culture: No growth 10/9>> urine culture: 30,000 colonies of Enterococcus 10/10>> pleural fluid culture: No growth   Procedures: 10/10>> thoracocentesis   Consults: IR CCS    Brief Hospital Course: COVID-19  PNA Likely causing fever/weakness on presentation patient Clinically improved with IV Remdesivir On room air-no indication for steroids   Transaminitis Likely due to COVID-19/Remdesivir use/underlying liver mets. LFTs now downtrending-recheck at PCP/oncologist office.   Should be okay to resume statins-with close follow-up. Continue to watch closely-is also on amiodarone.  Complicated UTI (Enterococcus) Continue amoxicillin on discharge.   Multiple cystic intra-abdominal masses Asymptomatic-no history of abdominal pain Appreciate CCS input-since asymptomatic-no intervention recommended. Doubt infectious etiology-symptoms on presentation with fever likely due to COVID-19 infection. Possibly malignant in etiology-discussed with patient's oncologist Dr Becky Sax 10/10-supportive care for now-if no fever/COVID-19 symptoms improve-no need to pursue inpatient aspiration/biopsy.  Oncology will follow in the outpatient setting.   Left pleural effusion Either due to malignancy or from CHF Thoracocentesis on 10/10-consistent with exudate per lights criteria. Cytology negative Cultures negative so far   Elevated D-dimer Due to COVID-19 infection CTA negative for PE Briefly on IV heparin-has been switched to Eliquis.    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 Continue Plavix   History of metastatic colon cancer-s/p right hemicolectomy-complicated by intra-abdominal abscesses Follows with oncology See above discussion regarding intra-abdominal cystic masses with oncology.  Oncology to decide in the outpatient setting if aspiration/biopsy needed.  His presenting symptoms were more  consistent with COVID infection.   Hypothyroidism Continue Synthroid   Seizure disorder Continue Keppra  BMI: Estimated body mass index is 21.96 kg/m as calculated from the following:   Height as of this encounter: '6\' 6"'  (1.981 m).   Weight as of this encounter: 86.2 kg.    Discharge Diagnoses:  Principal Problem:   COVID-19 virus infection Active Problems:   Hypothyroidism   Cancer of right colon ([place])   Chronic combined systolic and diastolic congestive heart failure (HCC)   CAD S/P percutaneous coronary angioplasty   Atrial fibrillation (HCC)   Essential hypertension   Seizures (HCC)   OSA on CPAP   Pleural effusion, left   Intraabdominal mass   Discharge Instructions:  Activity:  As tolerated  Discharge Instructions     (HEART FAILURE PATIENTS) Call MD:  Anytime you have any of the following symptoms: 1) 3 pound weight gain in 24 hours or 5 pounds in 1 week 2) shortness of breath, with or without a dry hacking cough 3) swelling in the hands, feet or stomach 4) if you have to sleep on extra pillows at night in order to breathe.   Complete by: As directed    Call MD for:  difficulty breathing, headache or visual disturbances   Complete by: As directed    Call MD for:  persistant nausea and vomiting   Complete by: As directed    Call MD for:  temperature >100.4   Complete by: As directed    Diet - low sodium heart healthy   Complete by: As directed    Discharge instructions   Complete by: As directed    Follow with Primary MD in 1-2 weeks  Please get a complete blood count and chemistry panel checked by your Primary MD at your next visit, and again as instructed by your Primary MD.  Get Medicines reviewed and adjusted: Please take all your medications with you for your next visit with your Primary MD  Laboratory/radiological data: Please request your Primary MD to go over all hospital tests and procedure/radiological results at the follow up, please ask your  Primary MD to get all Hospital records sent to his/her office.  In some cases, they will be blood work, cultures and biopsy results pending at the time of your discharge. Please request that your primary care M.D. follows up on these results.  Also Note the following: If you experience worsening of your admission symptoms, develop shortness of breath, life threatening emergency, suicidal or homicidal thoughts you must seek medical attention immediately by calling 911 or calling your MD immediately  if symptoms less severe.  You must read complete instructions/literature along with all the possible adverse reactions/side effects for all the Medicines you take and that have been prescribed to you. Take any new Medicines after you have completely understood and accpet all the possible adverse reactions/side effects.   Do not drive when taking Pain medications or sleeping medications (Benzodaizepines)  Do not take more than prescribed Pain, Sleep and Anxiety Medications. It is not advisable to combine anxiety,sleep and pain medications without talking with your primary care practitioner  Special Instructions: If you have smoked or chewed Tobacco  in the last 2 yrs please stop smoking, stop any regular Alcohol  and or any Recreational drug use.  Wear Seat belts while driving.  Please note: You were cared for by a hospitalist during your hospital stay. Once you are discharged, your primary care physician will handle any further medical issues. Please note that NO REFILLS for any discharge medications will be authorized once you are discharged, as  it is imperative that you return to your primary care physician (or establish a relationship with a primary care physician if you do not have one) for your post hospital discharge needs so that they can reassess your need for medications and monitor your lab values.   Increase activity slowly   Complete by: As directed       Allergies as of 09/26/2022    No Known Allergies      Medication List     STOP taking these medications    sacubitril-valsartan 97-103 MG Commonly known as: ENTRESTO Replaced by: sacubitril-valsartan 49-51 MG       TAKE these medications    acetaminophen 325 MG tablet Commonly known as: TYLENOL Take 1-2 tablets (325-650 mg total) by mouth every 4 (four) hours as needed for mild pain.   ALPRAZolam 0.25 MG tablet Commonly known as: XANAX Take 1 tablet (0.25 mg total) by mouth 2 (two) times daily as needed for anxiety or sleep.   amiodarone 200 MG tablet Commonly known as: PACERONE Take 2 tablets (400 mg total) by mouth daily.   amoxicillin 250 MG capsule Commonly known as: AMOXIL Take 1 capsule (250 mg total) by mouth every 8 (eight) hours for 3 days.   atorvastatin 80 MG tablet Commonly known as: LIPITOR Take 1 tablet (80 mg total) by mouth every evening.   capecitabine 500 MG tablet Commonly known as: XELODA Take 4 tabs every 12 hours for 14 days then off for 7 days, take after a meal   clopidogrel 75 MG tablet Commonly known as: PLAVIX Take 1 tablet (75 mg total) by mouth daily.   diclofenac Sodium 1 % Gel Commonly known as: VOLTAREN Apply 2 g topically 4 (four) times daily. What changed:  when to take this reasons to take this   Eliquis 5 MG Tabs tablet Generic drug: apixaban Take 1 tablet (5 mg total) by mouth 2 (two) times daily.   ezetimibe 10 MG tablet Commonly known as: ZETIA Take 1 tablet (10 mg total) by mouth daily.   Farxiga 10 MG Tabs tablet Generic drug: dapagliflozin propanediol Take 1 tablet (10 mg total) by mouth daily.   furosemide 40 MG tablet Commonly known as: LASIX Take 1 tablet (40 mg total) by mouth daily.   levETIRAcetam 750 MG tablet Commonly known as: KEPPRA Take 2 tablets (1,500 mg total) by mouth 2 (two) times daily.   levothyroxine 50 MCG tablet Commonly known as: SYNTHROID Take 1 tablet (50 mcg total) by mouth daily at 6 (six) AM.    lidocaine 4 % cream Commonly known as: LMX Apply topically 3 (three) times daily as needed (Apply to nostril for NG tube irritation).   metoprolol succinate 50 MG 24 hr tablet Commonly known as: TOPROL-XL Take 1 tablet (50 mg total) by mouth 2 (two) times daily. Take with or immediately following a meal.   naloxone 4 MG/0.1ML Liqd nasal spray kit Commonly known as: NARCAN Use as needed in case of overdose   oxyCODONE-acetaminophen 5-325 MG tablet Commonly known as: PERCOCET/ROXICET Take 1-2 tablets by mouth every 6 (six) hours as needed for moderate pain or severe pain.   pantoprazole 40 MG tablet Commonly known as: PROTONIX Take 1 tablet (40 mg total) by mouth daily.   polyethylene glycol 17 g packet Commonly known as: MIRALAX / GLYCOLAX Take 17 g by mouth 2 (two) times daily. What changed:  when to take this reasons to take this   potassium chloride 10 MEQ tablet Commonly known  asRhetta Mura Take 1 tablet (10 mEq total) by mouth daily.   sacubitril-valsartan 49-51 MG Commonly known as: ENTRESTO Take 1 tablet by mouth 2 (two) times daily. Replaces: sacubitril-valsartan 97-103 MG        No Known Allergies   Other Procedures/Studies: IR THORACENTESIS ASP PLEURAL SPACE W/IMG GUIDE  Result Date: 09/23/2022 INDICATION: Shortness of breath, previous CTA chest showed large left pleural effusion. Request for therapeutic and diagnostic thoracentesis. EXAM: ULTRASOUND GUIDED LEFT THORACENTESIS MEDICATIONS: 10 mL 1% lidocaine COMPLICATIONS: None immediate. PROCEDURE: An ultrasound guided thoracentesis was thoroughly discussed with the patient and questions answered. The benefits, risks, alternatives and complications were also discussed. The patient understands and wishes to proceed with the procedure. Written consent was obtained. Ultrasound was performed to localize and mark an adequate pocket of fluid in the left chest. The area was then prepped and draped in the normal  sterile fashion. 1% Lidocaine was used for local anesthesia. Under ultrasound guidance a 6 Fr Safe-T-Centesis catheter was introduced. Thoracentesis was performed. The catheter was removed and a dressing applied. FINDINGS: A total of approximately 1.3 L of hazy brown fluid was removed. Samples were sent to the laboratory as requested by the clinical team. Post procedure chest X-ray reviewed, negative for pneumothorax. IMPRESSION: Successful ultrasound guided left thoracentesis yielding 1.3 L of pleural fluid. Read by: Durenda Guthrie, PA-C Electronically Signed   By: Corrie Mckusick D.O.   On: 09/23/2022 12:57   DG Chest Port 1 View  Result Date: 09/23/2022 CLINICAL DATA:  Status post left thoracentesis EXAM: PORTABLE CHEST 1 VIEW COMPARISON:  09/22/2022 FINDINGS: Interval reduction in volume of a left-sided pleural effusion, now small, with improved aeration of the left lung base. No pneumothorax. Cardiomegaly with left chest multi lead pacer defibrillator. Right lung is normally aerated. Osseous structures unremarkable. IMPRESSION: 1. Interval reduction in volume of a left-sided pleural effusion, now small, with improved aeration of the left lung base. No pneumothorax. 2. Cardiomegaly with left chest multi lead pacer defibrillator. Electronically Signed   By: Delanna Ahmadi M.D.   On: 09/23/2022 11:28   CT Angio Chest PE W and/or Wo Contrast  Result Date: 09/22/2022 CLINICAL DATA:  Pulmonary embolus suspected with high probability. Progressive weakness since prior discharge on Thursday for cardiac arrest. Sepsis. Abdominal pain. EXAM: CT ANGIOGRAPHY CHEST CT ABDOMEN AND PELVIS WITH CONTRAST TECHNIQUE: Multidetector CT imaging of the chest was performed using the standard protocol during bolus administration of intravenous contrast. Multiplanar CT image reconstructions and MIPs were obtained to evaluate the vascular anatomy. Multidetector CT imaging of the abdomen and pelvis was performed using the standard  protocol during bolus administration of intravenous contrast. RADIATION DOSE REDUCTION: This exam was performed according to the departmental dose-optimization program which includes automated exposure control, adjustment of the mA and/or kV according to patient size and/or use of iterative reconstruction technique. CONTRAST:  30m OMNIPAQUE IOHEXOL 350 MG/ML SOLN COMPARISON:  CT chest 08/30/2022.  PET-CT 07/24/2022 FINDINGS: CTA CHEST FINDINGS Cardiovascular: Good opacification of the central and segmental pulmonary arteries. No focal filling defects. No evidence of significant pulmonary embolus. Cardiac enlargement. No pericardial effusions. Normal caliber thoracic aorta. Calcification of the aorta and coronary arteries. Mediastinum/Nodes: Thyroid gland is unremarkable. Esophagus is decompressed. No significant lymphadenopathy. Cardiac pacemaker. Lungs/Pleura: Large left pleural effusion. Atelectasis in both lung bases, greater on the left. Patchy infiltrates in the left upper lung and less prominent infiltrates in the right upper lung. Changes likely to represent multifocal pneumonia. Aspiration or edema would  be less likely. Airways are patent. No pneumothorax. Musculoskeletal: Degenerative changes in the spine. No destructive bone lesions. Review of the MIP images confirms the above findings. CT ABDOMEN and PELVIS FINDINGS Hepatobiliary: Scattered subcentimeter low-attenuation lesions in the liver. These are similar to previous study. Likely metastatic. Stone in the gallbladder. No bile duct dilatation. Pancreas: Pancreatic parenchyma appears to be intact. There are multiloculated thick-walled heterogeneous cystic lesions demonstrated in the left upper quadrant, surrounding the tail of the pancreas and extending around the spleen into the subdiaphragmatic space. The largest of these measures about 7 x 9.3 cm. These are increasing since the prior study and likely represent progressing metastatic disease although  abscesses could also have this appearance. Spleen: Spleen is otherwise normal. Surgical clips in the left upper quadrant anterior to the spleen. Adrenals/Urinary Tract: Bilateral adrenal gland nodules are unchanged. 1.7 cm on the right and 1.5 cm on the left. Likely metastatic. Small bilateral renal cysts. Largest on the left measures 2.4 cm diameter. No imaging follow-up is indicated. Stomach/Bowel: Stomach, small bowel, and colon are not abnormally distended. Previous right hemicolectomy resection with ileocolonic anastomosis appearing patent. Vascular/Lymphatic: Calcification of the aorta. No aneurysm. Scattered lymph nodes are not pathologically enlarged. Reproductive: Prostate is unremarkable. Other: Small amount of free fluid in the abdomen and pelvis along the pericolic gutters. No free air. Abdominal wall musculature appears intact. Musculoskeletal: Degenerative changes.  No destructive bone lesions. Review of the MIP images confirms the above findings. IMPRESSION: 1. No evidence of significant pulmonary embolus. 2. Large left pleural effusion. Bilateral basilar atelectasis. Patchy infiltrates in the lungs, greater on the left. Changes likely due to multifocal pneumonia although edema or aspiration would be secondary considerations. 3. Presumed metastatic lesions in the liver and adrenal glands are unchanged. 4. Heterogeneous complex cystic masses demonstrated in the left upper quadrant, around the spleen, and around the tail of the pancreas have significantly enlarged since previous study, likely metastatic although abscesses could also have this appearance in the setting of sepsis. 5. Small amount of free fluid in the abdomen and pelvis. 6. Cholelithiasis without evidence of acute cholecystitis. 7. Aortic atherosclerosis. Electronically Signed   By: Lucienne Capers M.D.   On: 09/22/2022 21:48   CT ABDOMEN PELVIS W CONTRAST  Result Date: 09/22/2022 CLINICAL DATA:  Pulmonary embolus suspected with high  probability. Progressive weakness since prior discharge on Thursday for cardiac arrest. Sepsis. Abdominal pain. EXAM: CT ANGIOGRAPHY CHEST CT ABDOMEN AND PELVIS WITH CONTRAST TECHNIQUE: Multidetector CT imaging of the chest was performed using the standard protocol during bolus administration of intravenous contrast. Multiplanar CT image reconstructions and MIPs were obtained to evaluate the vascular anatomy. Multidetector CT imaging of the abdomen and pelvis was performed using the standard protocol during bolus administration of intravenous contrast. RADIATION DOSE REDUCTION: This exam was performed according to the departmental dose-optimization program which includes automated exposure control, adjustment of the mA and/or kV according to patient size and/or use of iterative reconstruction technique. CONTRAST:  35m OMNIPAQUE IOHEXOL 350 MG/ML SOLN COMPARISON:  CT chest 08/30/2022.  PET-CT 07/24/2022 FINDINGS: CTA CHEST FINDINGS Cardiovascular: Good opacification of the central and segmental pulmonary arteries. No focal filling defects. No evidence of significant pulmonary embolus. Cardiac enlargement. No pericardial effusions. Normal caliber thoracic aorta. Calcification of the aorta and coronary arteries. Mediastinum/Nodes: Thyroid gland is unremarkable. Esophagus is decompressed. No significant lymphadenopathy. Cardiac pacemaker. Lungs/Pleura: Large left pleural effusion. Atelectasis in both lung bases, greater on the left. Patchy infiltrates in the left upper lung  and less prominent infiltrates in the right upper lung. Changes likely to represent multifocal pneumonia. Aspiration or edema would be less likely. Airways are patent. No pneumothorax. Musculoskeletal: Degenerative changes in the spine. No destructive bone lesions. Review of the MIP images confirms the above findings. CT ABDOMEN and PELVIS FINDINGS Hepatobiliary: Scattered subcentimeter low-attenuation lesions in the liver. These are similar to  previous study. Likely metastatic. Stone in the gallbladder. No bile duct dilatation. Pancreas: Pancreatic parenchyma appears to be intact. There are multiloculated thick-walled heterogeneous cystic lesions demonstrated in the left upper quadrant, surrounding the tail of the pancreas and extending around the spleen into the subdiaphragmatic space. The largest of these measures about 7 x 9.3 cm. These are increasing since the prior study and likely represent progressing metastatic disease although abscesses could also have this appearance. Spleen: Spleen is otherwise normal. Surgical clips in the left upper quadrant anterior to the spleen. Adrenals/Urinary Tract: Bilateral adrenal gland nodules are unchanged. 1.7 cm on the right and 1.5 cm on the left. Likely metastatic. Small bilateral renal cysts. Largest on the left measures 2.4 cm diameter. No imaging follow-up is indicated. Stomach/Bowel: Stomach, small bowel, and colon are not abnormally distended. Previous right hemicolectomy resection with ileocolonic anastomosis appearing patent. Vascular/Lymphatic: Calcification of the aorta. No aneurysm. Scattered lymph nodes are not pathologically enlarged. Reproductive: Prostate is unremarkable. Other: Small amount of free fluid in the abdomen and pelvis along the pericolic gutters. No free air. Abdominal wall musculature appears intact. Musculoskeletal: Degenerative changes.  No destructive bone lesions. Review of the MIP images confirms the above findings. IMPRESSION: 1. No evidence of significant pulmonary embolus. 2. Large left pleural effusion. Bilateral basilar atelectasis. Patchy infiltrates in the lungs, greater on the left. Changes likely due to multifocal pneumonia although edema or aspiration would be secondary considerations. 3. Presumed metastatic lesions in the liver and adrenal glands are unchanged. 4. Heterogeneous complex cystic masses demonstrated in the left upper quadrant, around the spleen, and  around the tail of the pancreas have significantly enlarged since previous study, likely metastatic although abscesses could also have this appearance in the setting of sepsis. 5. Small amount of free fluid in the abdomen and pelvis. 6. Cholelithiasis without evidence of acute cholecystitis. 7. Aortic atherosclerosis. Electronically Signed   By: Lucienne Capers M.D.   On: 09/22/2022 21:48   DG Chest Port 1 View  Result Date: 09/22/2022 CLINICAL DATA:  Questionable sepsis - evaluate for abnormality EXAM: PORTABLE CHEST 1 VIEW COMPARISON:  September 01, 2022 FINDINGS: The cardiomediastinal silhouette is unchanged in contour.LEFT chest AICD. No significant pneumothorax. Possible small LEFT pleural effusion. New LEFT retrocardiac opacity. Visualized abdomen is unremarkable. Advanced degenerative changes of the RIGHT shoulder. IMPRESSION: New LEFT retrocardiac opacity with possible small LEFT pleural effusion. Differential considerations include atelectasis versus infection. Electronically Signed   By: Valentino Saxon M.D.   On: 09/22/2022 17:34   ECHOCARDIOGRAM LIMITED  Result Date: 09/05/2022    ECHOCARDIOGRAM LIMITED REPORT   Patient Name:   Gregory [name] Date of Exam: 09/05/2022 Medical Rec #:  [id number]                 Height:       78.0 in Accession #:    [id number]                Weight:       224.6 lb Date of Birth:  11-May-1961  BSA:          2.372 m Patient Age:    20 years                  BP:           129/86 mmHg Patient Gender: M                         HR:           73 bpm. Exam Location:  Inpatient Procedure: Limited Color Doppler and Limited Echo Indications:    CHF-Acute Systolic  History:        Patient has prior history of Echocardiogram examinations, most                 recent 08/30/2022. CHF, CAD, Arrythmias:Atrial Fibrillation and                 Tachycardia; Risk Factors:Hypertension and Diabetes.  Sonographer:    Ronny Flurry Sonographer#2:  Danne Baxter  RDCS, FE, PE Referring Phys: [id number] [place]  1. Basal inferior/inferolateral aneurysm with dyskinesia of most of the inferior and inferolateral walls and basal-mid anterolateral hypokinesis. This corresponds to infarction/scar in the right coronary and circumflex artery territory. Normal contractility is preserved in the LAD artery distribution. No thrombus is seen. Left ventricular ejection fraction, by estimation, is 35 to 40%. The left ventricle has moderately decreased function. Left ventricular diastolic parameters are consistent with Grade I diastolic dysfunction (impaired relaxation).  2. Left atrial size was severely dilated.  3. Right atrial size was mild to moderately dilated.  4. Large pleural effusion in the left lateral region.  5. The mitral valve is normal in structure. Trivial mitral valve regurgitation.  6. The aortic valve is tricuspid. Aortic valve regurgitation is not visualized. Aortic valve sclerosis/calcification is present, without any evidence of aortic stenosis.  7. Aortic dilatation noted. There is mild dilatation of the aortic root, measuring 42 mm. Comparison(s): Prior images reviewed side by side. Changes from prior study are noted. The left ventricular function is unchanged. The left ventricular wall motion abnormality is unchanged. FINDINGS  Left Ventricle: Basal inferior/inferolateral aneurysm with dyskinesia of most of the inferior and inferolateral walls and basal-mid anterolateral hypokinesis. This corresponds to infarction/scar in the right coronary and circumflex artery territory. Normal contractility is preserved in the LAD artery distribution. No thrombus is seen. Left ventricular ejection fraction, by estimation, is 35 to 40%. The left ventricle has moderately decreased function. The left ventricular internal cavity size was normal in size. Left ventricular diastolic parameters are consistent with Grade I diastolic dysfunction (impaired relaxation).  Normal left ventricular filling pressure.  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 The mid and distal inferior wall and mid inferolateral segment are dyskinetic. The antero-lateral wall, mid inferoseptal segment, and basal inferoseptal segment are hypokinetic. The entire anterior wall, entire anterior septum, apical lateral segment, and apex are normal. Left Atrium: Left atrial size was severely dilated. Right Atrium: Right atrial size was mild to moderately dilated. Pericardium: There is no evidence of pericardial effusion. Mitral Valve: The mitral valve is normal in structure. Trivial mitral valve regurgitation. Tricuspid Valve: The tricuspid valve is normal in structure. Tricuspid valve regurgitation is not demonstrated. Aortic Valve: The aortic valve is tricuspid. Aortic valve regurgitation is not visualized. Aortic valve sclerosis/calcification is present, without any evidence of aortic stenosis. Pulmonic Valve: The pulmonic valve was grossly normal.  Pulmonic valve regurgitation is not visualized. Aorta: Aortic dilatation noted. There is mild dilatation of the aortic root, measuring 42 mm. Additional Comments: There is a large pleural effusion in the left lateral region. LEFT VENTRICLE PLAX 2D LVIDd:         5.51 cm Diastology LVIDs:         4.50 cm LV e' medial:    4.90 cm/s LV PW:         1.00 cm LV E/e' medial:  5.4 LV IVS:        1.60 cm LV e' lateral:   7.29 cm/s                        LV E/e' lateral: 3.7  RIGHT VENTRICLE RV S prime:     11.30 cm/s TAPSE (M-mode): 2.1 cm MV E velocity: 26.68 cm/s MV A velocity: 71.30 cm/s MV E/A ratio:  0.37 [name] Croitoru MD Electronically signed by Sanda Klein MD Signature Date/Time: 09/05/2022/3:32:28 PM    Final    US Abdomen Limited RUQ (LIVER/GB)  Result Date: 09/03/2022 CLINICAL DATA:  Biliary obstruction. EXAM: ULTRASOUND ABDOMEN LIMITED RIGHT UPPER QUADRANT COMPARISON:  None Available. FINDINGS:  Gallbladder: A 1.3 cm shadowing, echogenic gallstone is seen within the dependent portion of the gallbladder lumen. The gallbladder wall measures 4.56 mm in thickness. No sonographic Murphy sign noted by sonographer. Common bile duct: Diameter: 3.8 mm Liver: No focal lesion identified. Within normal limits in parenchymal echogenicity. Portal vein is patent on color Doppler imaging with normal direction of blood flow towards the liver. Other: A mild amount of abdominal free fluid is noted. Of incidental note is the presence of a 2.4 cm simple cyst within the upper pole of the right kidney. IMPRESSION: 1. Cholelithiasis and mild gallbladder wall thickening, without evidence of acute cholecystitis. 2. Mild amount of abdominal free fluid which may represent the source of the previously noted gallbladder wall thickening. 3. Simple right renal cyst. No additional follow-up imaging is recommended. Electronically Signed   By: Virgina Norfolk M.D.   On: 09/03/2022 22:28   DG Abd Portable 1V  Result Date: 09/03/2022 CLINICAL DATA:  [id number]. Encounter for nasogastric (NG) tube placement EXAM: PORTABLE ABDOMEN - 1 VIEW COMPARISON:  X-ray abdomen 09/02/2022 FINDINGS: Partially visualized triple lead cardiac pacemaker and defibrillator. Interval advancement of an enteric tube bat courses below the hemidiaphragm with tip overlying the expected region of the second portion of the duodenum. The bowel gas pattern is normal. No radio-opaque calculi or other significant radiographic abnormality are seen. IMPRESSION: Enteric tube courses below the hemidiaphragm with tip overlying the expected region of the second portion of the duodenum. Electronically Signed   By: Iven Finn M.D.   On: 09/03/2022 19:20   DG Abd Portable 1V  Result Date: 09/02/2022 CLINICAL DATA:  Feeding tube placement. EXAM: PORTABLE ABDOMEN - 1 VIEW COMPARISON:  Radiographs 09/02/2022 and 08/31/2022.  CT 07/16/2022. FINDINGS: 1445 hours. Tip of the  feeding tube projects over the mid lumbar spine, consistent with position in the distal stomach. Left upper quadrant embolization coils and cardiac defibrillator leads are noted. The visualized bowel gas pattern is nonobstructive. IMPRESSION: Tip of the feeding tube projects over the mid abdomen, consistent with position in the distal stomach. Electronically Signed   By: Richardean Sale M.D.   On: 09/02/2022 14:54   DG Abd Portable 1V  Result Date: 09/02/2022 CLINICAL DATA:  Check gastric catheter placed EXAM: PORTABLE ABDOMEN -  1 VIEW COMPARISON:  08/31/2022 FINDINGS: Gastric catheter is noted extending into the third portion of the duodenum. Scattered large and small bowel gas is noted. No free air is noted. IMPRESSION: Gastric catheter deep in the duodenum Electronically Signed   By: Inez Catalina M.D.   On: 09/02/2022 02:33   DG CHEST PORT 1 VIEW  Result Date: 09/01/2022 CLINICAL DATA:  Endotracheal tube placement. EXAM: PORTABLE CHEST 1 VIEW COMPARISON:  09/01/2022. FINDINGS: The heart size and mediastinal contours are stable. Minimal subsegmental atelectasis at the left lung base. No consolidation, effusion, or pneumothorax. A stable multi lead pacemaker device is present over the left chest. An endotracheal tube terminates 6.2 cm above the carina. An enteric tube courses over the midline and out of the field of view. IMPRESSION: 1. Mild subsegmental atelectasis at the left lung base. 2. Support apparatus as described above. Electronically Signed   By: [name] M.D.   On: 09/01/2022 01:36   DG CHEST PORT 1 VIEW  Result Date: 09/01/2022 CLINICAL DATA:  Endotracheal 2 placement EXAM: PORTABLE CHEST 1 VIEW COMPARISON:  09/01/2019 FINDINGS: Support Apparatus: --Endotracheal tube: Tip at the level of the clavicular heads. --Enteric tube:Tip and sideport are below the field of view. --Vascular catheter(s):None --Other: Left chest wall 3 lead AICD The heart size and mediastinal contours are within  normal limits. The lungs are clear. No pleural effusion or pneumothorax. IMPRESSION: Endotracheal tube tip at the level of the clavicular heads. Electronically Signed   By: Ulyses Jarred M.D.   On: 09/01/2022 00:59   CT HEAD WO CONTRAST (5MM)  Result Date: 08/31/2022 CLINICAL DATA:  Acute neurologic deficit EXAM: CT HEAD WITHOUT CONTRAST TECHNIQUE: Contiguous axial images were obtained from the base of the skull through the vertex without intravenous contrast. RADIATION DOSE REDUCTION: This exam was performed according to the departmental dose-optimization program which includes automated exposure control, adjustment of the mA and/or kV according to patient size and/or use of iterative reconstruction technique. COMPARISON:  08/30/2022 FINDINGS: Brain: There is no mass, hemorrhage or extra-axial collection. The size and configuration of the ventricles and extra-axial CSF spaces are normal. There is hypoattenuation of the white matter, most commonly indicating chronic small vessel disease. Partially empty sella. Vascular: No abnormal hyperdensity of the major intracranial arteries or dural venous sinuses. No intracranial atherosclerosis. Skull: The visualized skull base, calvarium and extracranial soft tissues are normal. Sinuses/Orbits: No fluid levels or advanced mucosal thickening of the visualized paranasal sinuses. No mastoid or middle ear effusion. The orbits are normal. IMPRESSION: Chronic small vessel disease without acute intracranial abnormality. Electronically Signed   By: Ulyses Jarred M.D.   On: 08/31/2022 21:09   DG Abd Portable 1V  Result Date: 08/31/2022 CLINICAL DATA:  Evaluate location of enteric tube EXAM: PORTABLE ABDOMEN - 1 VIEW COMPARISON:  08/30/2022 FINDINGS: Tip of enteric tube is noted in the fundus of the stomach pointing cephalad. There is a kink in the distal course of enteric tube in the stomach. This finding has not changed. Bowel gas pattern in the upper abdomen is essentially  unremarkable. There is moderate amount of gas in colon. Stomach is not distended. IMPRESSION: Tip of enteric tube is seen in the fundus of the stomach pointing cephalad. There is a kink in the distal course of enteric tube. No interval changes are noted since 08/30/2022. Electronically Signed   By: Elmer Picker M.D.   On: 08/31/2022 11:55   Overnight EEG with video  Result Date: 08/31/2022 Zeb Comfort  Jenetta Downer, MD     09/01/2022  9:49 AM Patient Name: Elba Dendinger MRN: [id number] Epilepsy Attending: Lora Havens Referring Physician/Provider: Katy Apo, NP Duration: 08/30/2022 1947 to 08/31/2022 1947 Patient history: 61yo M Patient with known hx epilepsy presented in focal status epilepticus. EEG to evaluate for seizure Level of alertness: lethargic/sedated, asleep AEDs during EEG study: LEV Technical aspects: This EEG study was done with scalp electrodes positioned according to the 10-20 International system of electrode placement. Electrical activity was reviewed with band pass filter of 1-'70Hz' , sensitivity of 7 uV/mm, display speed of 13m/sec with a '60Hz'  notched filter applied as appropriate. EEG data were recorded continuously and digitally stored.  Video monitoring was available and reviewed as appropriate. Description: No posterior dominant rhythm was seen. Sleep was characterized by sleep spindles (12 to 14 Hz), maximal frontocentral region. EEG showed continuous generalized 3 to 6 Hz theta-delta slowing admixed with intermittent generalized 13-'15Hz'  beta activity. Event button was pressed on 08/31/2022 at 0215. Per RN, patient had rhythmic eye closing and abdominal movements that were difficult to visualize on camera.  Concomitant EEG before, during and after the event did not show any EEG changes suggest seizure. Hyperventilation and photic stimulation were not performed.   ABNORMALITY. - Continuous slow, generalized IMPRESSION: This study is suggestive of severe diffuse  encephalopathy, nonspecific etiology but could be related to sedation. No seizures or epileptiform discharges were seen throughout the recording. Event button was pressed on 08/31/2022 at 0215. Per RN, patient had rhythmic eye closing and abdominal movements that were difficult to visualize on camera.  Concomitant EEG did not show any EEG changes suggest seizure. This was most likely NOT an epileptic event. PLora Havens  DG Chest Port 1 View  Result Date: 08/31/2022 CLINICAL DATA:  Aspiration pneumonia. EXAM: PORTABLE CHEST 1 VIEW COMPARISON:  One-view chest x-ray 08/30/2022 FINDINGS: Pacing wires are stable. Endotracheal tube terminates 6.5 cm above the carina. The endotracheal tube terminates in the esophagus. New right middle lobe or lower lobe airspace opacities are present. Lung volumes remain low. Minimal left basilar atelectasis is present. IMPRESSION: 1. New right middle lobe or lower lobe airspace disease compatible with pneumonia. 2. Minimal left basilar atelectasis. 3. The endotracheal tube terminates 6.5 cm above the carina. Electronically Signed   By: CSan MorelleM.D.   On: 08/31/2022 06:36   DG Abd 1 View  Result Date: 08/30/2022 CLINICAL DATA:  Check gastric catheter placement EXAM: ABDOMEN - 1 VIEW COMPARISON:  Film from earlier in the same day. FINDINGS: Gastric catheter has been advanced and is now kinked upon itself at the proximal side port. Scattered large and small bowel gas is noted. No obstructive changes are seen. IMPRESSION: Advancement of gastric catheter now results in kinking at the proximal side port within the gastric lumen. Electronically Signed   By: MInez CatalinaM.D.   On: 08/30/2022 23:19   DG Abd 1 View  Result Date: 08/30/2022 CLINICAL DATA:  Check gastric catheter placement EXAM: ABDOMEN - 1 VIEW COMPARISON:  Film from earlier in the same day. FINDINGS: Gastric catheter is again noted with the tip in the stomach. The proximal side port is just beyond the  gastroesophageal junction. The overall appearance is stable from the prior exam. IMPRESSION: Stable appearance of the gastric catheter. Electronically Signed   By: MInez CatalinaM.D.   On: 08/30/2022 22:32   DG Abd 1 View  Result Date: 08/30/2022 CLINICAL DATA:  OG tube EXAM:  ABDOMEN - 1 VIEW COMPARISON:  Abdominal x-ray 08/30/2022 FINDINGS: Orogastric tube tip is in the proximal stomach with side hole at the level of the gastroesophageal junction. There is gaseous distention of bowel in the upper abdomen. Embolic coils overlie the left upper quadrant, unchanged. Pacemaker wires are partially visualized. IMPRESSION: Orogastric tube tip is at the level of the proximal stomach with side hole at the gastroesophageal junction. Recommend advancing tube. Electronically Signed   By: Ronney Asters M.D.   On: 08/30/2022 22:08   DG Abd 1 View  Result Date: 08/30/2022 CLINICAL DATA:  OG placement EXAM: ABDOMEN - 1 VIEW COMPARISON:  08/30/2021. FINDINGS: Distended loops of small bowel are noted in the left upper quadrant measuring up to 4 cm. An enteric tube terminates over the stomach, however the side port is above the level of the diaphragm and should be advanced approximately 7 cm. Pacemaker leads are noted over the heart. No radio-opaque calculi or other significant radiographic abnormality are seen. IMPRESSION: 1. Focal loop of distended small bowel in the left upper quadrant measuring 4 cm, possible ileus versus obstruction. 2. The side port of the enteric tube is above the level of the diaphragm and should be advanced approximately 7 cm. Electronically Signed   By: [name] M.D.   On: 08/30/2022 20:01   [place] EKG SITE RITE  Result Date: 08/30/2022 If Site Rite image not attached, placement could not be confirmed due to current cardiac rhythm.  ECHOCARDIOGRAM LIMITED  Result Date: 08/30/2022    ECHOCARDIOGRAM LIMITED REPORT   Patient Name:   [facility] Date of Exam: 08/30/2022 Medical Rec  #:  [id number]                 Height:       78.0 in Accession #:    [id number]                Weight:       201.9 lb Date of Birth:  30-Jun-1961                 BSA:          2.267 m Patient Age:    34 years                  BP:           102/65 mmHg Patient Gender: M                         HR:           70 bpm. Exam Location:  Inpatient Procedure: Limited Echo STAT ECHO Indications:    Cardiac Arrest  History:        Patient has prior history of Echocardiogram examinations, most                 recent 04/07/2022. CAD. Respiratory failure requiring mechanical                 ventilation.  Sonographer:    Merrie Roof RDCS Referring Phys: GRACE E BOWSER  Sonographer Comments: Technically difficult study due to poor echo windows, suboptimal parasternal window, suboptimal apical window and echo performed with patient supine and on artificial respirator. IMPRESSIONS  1. Limited echo for LVEF  2. Left ventricular ejection fraction, by estimation, is 35 to 40%. The left ventricle has moderately decreased function. The left ventricle demonstrates regional wall motion abnormalities (see scoring diagram/findings for description).  There is severe left ventricular hypertrophy. There is severe hypokinesis of the left ventricular, entire anteroseptal wall, anterior wall and apical segment.  3. Right ventricular systolic function is mildly reduced. The right ventricular size is normal.  4. The tricuspid valve is abnormal. Tricuspid valve regurgitation is mild to moderate. Comparison(s): Prior images unable to be directly viewed, comparison made by report only. Changes from prior study are noted. 04/07/2022: LVEF 35-40%, inferior and lateral hypokinesis. Conclusion(s)/Recommendation(s): Wall motion abnormalities are new compared to prior echo are suggestive of LAD territory ischemia. Critical findings reported to Dr. Valeta Harms and acknowledged at 4:30 pm. FINDINGS  Left Ventricle: Left ventricular ejection fraction, by estimation, is 35  to 40%. The left ventricle has moderately decreased function. The left ventricle demonstrates regional wall motion abnormalities. Severe hypokinesis of the left ventricular, entire  anteroseptal wall, anterior wall and apical segment. The left ventricular internal cavity size was small. There is severe left ventricular hypertrophy. Right Ventricle: The right ventricular size is normal. No increase in right ventricular wall thickness. Right ventricular systolic function is mildly reduced. Tricuspid Valve: The tricuspid valve is abnormal. Tricuspid valve regurgitation is mild to moderate. Venous: IVC assessment for right atrial pressure unable to be performed due to mechanical ventilation. Additional Comments: A device lead is visualized. LEFT VENTRICLE PLAX 2D LVIDd:         4.00 cm LVIDs:         3.30 cm LV PW:         1.70 cm LV IVS:        1.95 cm  IVC IVC diam: 1.80 cm TRICUSPID VALVE TR Peak grad:   29.8 mmHg TR Vmax:        273.00 cm/s Lyman Bishop MD Electronically signed by Lyman Bishop MD Signature Date/Time: 08/30/2022/4:48:31 PM    Final    DG Abdomen 1 View  Result Date: 08/30/2022 CLINICAL DATA:  Enteric catheter placement EXAM: ABDOMEN - 1 VIEW COMPARISON:  05/13/2022, 08/30/2022 FINDINGS: Frontal view of the abdomen and upper pelvis are obtained. Enteric catheter passes below diaphragm, tip projecting over the gastric fundus and side port projecting at the gastroesophageal junction. Cardiac pacer and external cardiac fibrillator are noted. Bowel gas pattern is unremarkable. No acute bony abnormality. IMPRESSION: 1. Enteric catheter as above, tip projecting over gastric fundus. Electronically Signed   By: Randa Ngo M.D.   On: 08/30/2022 15:45   CT Angio Chest PE W and/or Wo Contrast  Result Date: 08/30/2022 CLINICAL DATA:  Concern for pulmonary embolism. EXAM: CT ANGIOGRAPHY CHEST WITH CONTRAST TECHNIQUE: Multidetector CT imaging of the chest was performed using the standard protocol  during bolus administration of intravenous contrast. Multiplanar CT image reconstructions and MIPs were obtained to evaluate the vascular anatomy. RADIATION DOSE REDUCTION: This exam was performed according to the departmental dose-optimization program which includes automated exposure control, adjustment of the mA and/or kV according to patient size and/or use of iterative reconstruction technique. CONTRAST:  29m OMNIPAQUE IOHEXOL 350 MG/ML SOLN COMPARISON:  Same day chest radiograph and PET-CT dated 07/24/2022. FINDINGS: Cardiovascular: Satisfactory opacification of the pulmonary arteries to the segmental level. No evidence of pulmonary embolism. The right and left pulmonary arteries are enlarged, each measuring 3.3 cm, suggestive of pulmonary hypertension. Vascular calcifications are seen in the coronary arteries and aortic arch. Normal heart size. No pericardial effusion. A left subclavian device is noted. Mediastinum/Nodes: No enlarged mediastinal, hilar, or axillary lymph nodes. Thyroid gland and esophagus demonstrate no significant findings. Lungs/Pleura: An endotracheal tube  terminates in the midthoracic trachea. There is occlusion of the right lower lobe bronchus (series 7 images 92-95) which is favored to reflect aspirated debris. Aspirated debris is seen in the trachea and right mainstem bronchus. There is moderate right lower lobe atelectasis and mild right upper lobe atelectasis. There is a small to moderate left pleural effusion with associated left lower lobe atelectasis. There is no pneumothorax. Upper Abdomen: Multiple hypoechoic lesions in the liver are redemonstrated and consistent with the previously described metastatic lesions from exam dated 07/24/2022. Musculoskeletal: Nondisplaced rib fractures are seen of the bilateral anterior third through seventh ribs. Degenerative changes are seen in the spine. Review of the MIP images confirms the above findings. IMPRESSION: 1. No evidence of  pulmonary embolism. 2. Aspirated debris in the trachea and bronchi supplying the right lower lobe with moderate right lower lobe atelectasis. 3. Small to moderate left pleural effusion with associated left lower lobe atelectasis. 4. Nondisplaced rib fractures of the bilateral anterior third through seventh ribs. 5. Findings suggestive of pulmonary hypertension. Aortic Atherosclerosis (ICD10-I70.0). Electronically Signed   By: Zerita Boers M.D.   On: 08/30/2022 14:26   CT ANGIO HEAD NECK W WO CM W PERF (CODE STROKE)  Result Date: 08/30/2022 CLINICAL DATA:  Acute neuro deficit.  Seizure.  Aphasia. EXAM: CT ANGIOGRAPHY HEAD AND NECK TECHNIQUE: Multidetector CT imaging of the head and neck was performed using the standard protocol during bolus administration of intravenous contrast. Multiplanar CT image reconstructions and MIPs were obtained to evaluate the vascular anatomy. Carotid stenosis measurements (when applicable) are obtained utilizing NASCET criteria, using the distal internal carotid diameter as the denominator. RADIATION DOSE REDUCTION: This exam was performed according to the departmental dose-optimization program which includes automated exposure control, adjustment of the mA and/or kV according to patient size and/or use of iterative reconstruction technique. CONTRAST:  44m OMNIPAQUE IOHEXOL 350 MG/ML SOLN COMPARISON:  None Available. FINDINGS: Suboptimal vascular opacification. Multiple attempts were made. The patient was unstable during the study. CTA NECK FINDINGS Aortic arch: Limited coverage of the aortic arch. Atherosclerotic calcification aortic arch and proximal great vessels. Proximal great vessels patent without stenosis Right carotid system: Atherosclerotic calcification right common carotid artery, right carotid bifurcation. 70% diameter stenosis proximal right internal carotid artery due to calcific stenosis. Left carotid system: Atherosclerotic calcification in the left common carotid  artery and left carotid bifurcation without significant stenosis. Mild atherosclerotic calcification left internal carotid artery. Vertebral arteries: Severe calcific stenosis proximal right vertebral artery which is then patent to the skull base with scattered calcifications. Left vertebral artery widely patent Skeleton: No acute skeletal abnormality. Other neck: Patient is intubated. Retained secretions in the pharynx. No mass in the neck Upper chest: Chest CT reported separately from today. Review of the MIP images confirms the above findings CTA HEAD FINDINGS Anterior circulation: Atherosclerotic calcification and mild stenosis in the cavernous carotid bilaterally. Anterior and middle cerebral arteries patent bilaterally without stenosis. Hypoplastic right A1 segment Posterior circulation: Severe atherosclerotic calcific stenosis distal right vertebral artery V4 segment. Faint opacification distal to the stenosis. Left vertebral artery patent to the basilar without significant stenosis. Basilar patent. Mild opacification of PICA bilaterally. Superior cerebellar and posterior cerebral arteries patent bilaterally without stenosis. No aneurysm or large vessel occlusion. Venous sinuses: Limited venous enhancement. Anatomic variants: None Review of the MIP images confirms the above findings IMPRESSION: 1. Technically limited study. The patient was unstable during the study and multiple injections were made with suboptimal arterial enhancement. 2. Negative for intracranial  large vessel occlusion 3. Extensive extracranial atherosclerotic calcification. Atherosclerotic aortic arch. 4. 70% diameter stenosis proximal right internal carotid artery. Diffuse atherosclerotic disease left carotid without significant stenosis 5. 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 Left vertebral artery patent without stenosis. Electronically Signed   By: Franchot Gallo M.D.   On: 08/30/2022 14:25   DG Chest  Port 1 View  Result Date: 08/30/2022 CLINICAL DATA:  Hypoxia, intubation. EXAM: PORTABLE CHEST 1 VIEW COMPARISON:  Chest radiograph dated 05/08/2022. FINDINGS: The heart is enlarged. A left subclavian approach cardiac device is redemonstrated. Vascular calcifications are seen in the aortic arch. The left costophrenic angle is not imaged. There is mild left basilar atelectasis/airspace disease. The right lung is clear. There is no pleural effusion on the right. There is no pneumothorax on either side. Degenerative changes are seen in the spine. IMPRESSION: Mild left basilar atelectasis/airspace disease. Aortic Atherosclerosis (ICD10-I70.0). Electronically Signed   By: Zerita Boers M.D.   On: 08/30/2022 13:17   CT HEAD CODE STROKE WO CONTRAST  Result Date: 08/30/2022 CLINICAL DATA:  Code stroke. 61 year old male. Metastatic colon cancer. EXAM: CT HEAD WITHOUT CONTRAST TECHNIQUE: Contiguous axial images were obtained from the base of the skull through the vertex without intravenous contrast. RADIATION DOSE REDUCTION: This exam was performed according to the departmental dose-optimization program which includes automated exposure control, adjustment of the mA and/or kV according to patient size and/or use of iterative reconstruction technique. COMPARISON:  Head CT 09/16/2020. FINDINGS: Brain: No midline shift, mass effect, or evidence of intracranial mass lesion. No acute intracranial hemorrhage identified. Stable cerebral volume since 2021. Patchy and scattered bilateral cerebral white matter hypodensity does not appear significantly changed. Possible chronic lacunar infarct in the left thalamus, stable. Chronic partially empty sella. Vascular: Calcified atherosclerosis at the skull base. Difficult a to exclude a hyperdense left MCA branch in the left sylvian fissure which is new since 2021 (series 2, images 13 and 16. No cytotoxic edema identified. Skull: Stable.  No acute osseous abnormality identified.  Sinuses/Orbits: Visualized paranasal sinuses and mastoids are stable and well aerated. Other: No acute orbit or scalp soft tissue finding identified. ASPECTS [facility] Stroke Program Early CT Score) Total score (0-10 with 10 being normal): 10 IMPRESSION: 1. Questionable hyperdense Left MCA branch in the sylvian fissure. But no acute intracranial hemorrhage or cortically based infarct identified. ASPECTS 10. 2. Stable non contrast CT appearance of small vessel disease. 3. These results were communicated to Dr. Quinn Axe at 12:43 pm on 08/30/2022 by text page via the [facility] messaging system. Electronically Signed   By: Genevie Ann M.D.   On: 08/30/2022 12:44     TODAY-DAY OF DISCHARGE:  Subjective:   Zaylen Buffone today has no headache,no chest abdominal pain,no new weakness tingling or numbness, feels much better wants to go home today.   Objective:   Blood pressure (!) 132/97, pulse 65, temperature 97.7 F (36.5 C), temperature source Oral, resp. rate 15, height '6\' 6"'  (1.981 m), weight 86.2 kg, SpO2 94 %.  Intake/Output Summary (Last 24 hours) at 09/26/2022 0855 Last data filed at 09/26/2022 0039 Gross per 24 hour  Intake 723 ml  Output 2450 ml  Net -1727 ml   Filed Weights   09/22/22 1658  Weight: 86.2 kg    Exam: Awake Alert, Oriented *3, No new F.N deficits, Normal affect [place].AT,PERRAL Supple Neck,No JVD, No cervical lymphadenopathy appriciated.  Symmetrical Chest wall movement, Good air movement bilaterally, CTAB RRR,No Gallops,Rubs or new Murmurs, No  Parasternal Heave +ve B.Sounds, Abd Soft, Non tender, No organomegaly appriciated, No rebound -guarding or rigidity. No Cyanosis, Clubbing or edema, No new Rash or bruise   PERTINENT RADIOLOGIC STUDIES: No results found.   PERTINENT LAB RESULTS: CBC: Recent Labs    09/25/22 0611 09/26/22 0600  WBC 5.8 5.9  HGB 12.0* 12.3*  HCT 36.3* 36.7*  PLT 240 231   CMET CMP     Component Value Date/Time   NA 136 09/26/2022 0600   NA  137 04/11/2022 1219   K 4.1 09/26/2022 0600   CL 107 09/26/2022 0600   CO2 24 09/26/2022 0600   GLUCOSE 136 (H) 09/26/2022 0600   BUN 15 09/26/2022 0600   BUN 13 04/11/2022 1219   CREATININE 0.74 09/26/2022 0600   CREATININE 1.08 12/12/2016 1445   CALCIUM 7.8 (L) 09/26/2022 0600   PROT 6.2 (L) 09/26/2022 0600   PROT 6.5 02/28/2022 1304   ALBUMIN 2.4 (L) 09/26/2022 0600   ALBUMIN 4.0 02/28/2022 1304   AST 66 (H) 09/26/2022 0600   ALT 103 (H) 09/26/2022 0600   ALKPHOS 142 (H) 09/26/2022 0600   BILITOT 0.5 09/26/2022 0600   BILITOT 0.2 02/28/2022 1304   GFRNONAA >60 09/26/2022 0600   GFRAA 91 11/15/2020 0959    GFR Estimated Creatinine Clearance: 115.2 mL/min (by C-G formula based on SCr of 0.74 mg/dL). No results for input(s): "LIPASE", "AMYLASE" in the last 72 hours. No results for input(s): "CKTOTAL", "CKMB", "CKMBINDEX", "TROPONINI" in the last 72 hours. Invalid input(s): "POCBNP" No results for input(s): "DDIMER" in the last 72 hours. No results for input(s): "HGBA1C" in the last 72 hours. No results for input(s): "CHOL", "HDL", "LDLCALC", "TRIG", "CHOLHDL", "LDLDIRECT" in the last 72 hours. No results for input(s): "TSH", "T4TOTAL", "T3FREE", "THYROIDAB" in the last 72 hours.  Invalid input(s): "FREET3" No results for input(s): "VITAMINB12", "FOLATE", "FERRITIN", "TIBC", "IRON", "RETICCTPCT" in the last 72 hours. Coags: No results for input(s): "INR" in the last 72 hours.  Invalid input(s): "PT" Microbiology: Recent Results (from the past 240 hour(s))  Urine Culture     Status: Abnormal   Collection Time: 09/22/22  4:57 PM   Specimen: In/Out Cath Urine  Result Value Ref Range Status   Specimen Description IN/OUT CATH URINE  Final   Special Requests   Final    NONE Performed at [facility], [address], [place], [place] [address]    Culture 30,000 COLONIES/mL ENTEROCOCCUS FAECALIS (A)  Final   Report Status 09/25/2022 FINAL  Final   Organism ID,  Bacteria ENTEROCOCCUS FAECALIS (A)  Final      Susceptibility   Enterococcus faecalis - MIC*    AMPICILLIN <=2 SENSITIVE Sensitive     NITROFURANTOIN <=16 SENSITIVE Sensitive     VANCOMYCIN 1 SENSITIVE Sensitive     * 30,000 COLONIES/mL ENTEROCOCCUS FAECALIS  Resp Panel by RT-PCR (Flu A&B, Covid) Anterior Nasal Swab     Status: Abnormal   Collection Time: 09/22/22  5:18 PM   Specimen: Anterior Nasal Swab  Result Value Ref Range Status   SARS Coronavirus 2 by RT PCR POSITIVE (A) NEGATIVE Final    Comment: (NOTE) SARS-CoV-2 target nucleic acids are DETECTED.  The SARS-CoV-2 RNA is generally detectable in upper respiratory specimens during the acute phase of infection. Positive results are indicative of the presence of the identified virus, but do not rule out bacterial infection or co-infection with other pathogens not detected by the test. Clinical correlation with patient history and other diagnostic  information is necessary to determine patient infection status. The expected result is Negative.  Fact Sheet for Patients: EntrepreneurPulse.com.au  Fact Sheet for Healthcare Providers: IncredibleEmployment.be  This test is not yet approved or cleared by the [place] FDA and  has been authorized for detection and/or diagnosis of SARS-CoV-2 by FDA under an Emergency Use Authorization (EUA).  This EUA will remain in effect (meaning this test can be used) for the duration of  the COVID-19 declaration under Section 564(b)(1) of the A ct, 21 U.S.C. section 360bbb-3(b)(1), unless the authorization is terminated or revoked sooner.     Influenza A by PCR NEGATIVE NEGATIVE Final   Influenza B by PCR NEGATIVE NEGATIVE Final    Comment: (NOTE) The Xpert Xpress SARS-CoV-2/FLU/RSV plus assay is intended as an aid in the diagnosis of influenza from Nasopharyngeal swab specimens and should not be used as a sole basis for treatment. Nasal washings  and aspirates are unacceptable for Xpert Xpress SARS-CoV-2/FLU/RSV testing.  Fact Sheet for Patients: EntrepreneurPulse.com.au  Fact Sheet for Healthcare Providers: IncredibleEmployment.be  This test is not yet approved or cleared by the [place] FDA and has been authorized for detection and/or diagnosis of SARS-CoV-2 by FDA under an Emergency Use Authorization (EUA). This EUA will remain in effect (meaning this test can be used) for the duration of the COVID-19 declaration under Section 564(b)(1) of the Act, 21 U.S.C. section 360bbb-3(b)(1), unless the authorization is terminated or revoked.  Performed at [facility], [place] [address], [place], [place] [address]   Blood Culture (routine x 2)     Status: None (Preliminary result)   Collection Time: 09/22/22  5:20 PM   Specimen: BLOOD  Result Value Ref Range Status   Specimen Description BLOOD SITE NOT SPECIFIED  Final   Special Requests   Final    BOTTLES DRAWN AEROBIC AND ANAEROBIC Blood Culture adequate volume   Culture   Final    NO GROWTH 3 DAYS Performed at [facility], [address] [place], [place] [address]    Report Status PENDING  Incomplete  Blood Culture (routine x 2)     Status: None (Preliminary result)   Collection Time: 09/22/22  5:20 PM   Specimen: BLOOD  Result Value Ref Range Status   Specimen Description BLOOD SITE NOT SPECIFIED  Final   Special Requests   Final    BOTTLES DRAWN AEROBIC AND ANAEROBIC Blood Culture adequate volume   Culture   Final    NO GROWTH 3 DAYS Performed at [facility], [address] [place], [place] [address]    Report Status PENDING  Incomplete  Gram stain     Status: None   Collection Time: 09/23/22 10:42 AM   Specimen: Lung, Left; Pleural Fluid  Result Value Ref Range Status   Specimen Description PLEURAL  Final   Special Requests  LEFT LUNG  Final   Gram Stain   Final    NO WBC SEEN NO ORGANISMS  SEEN Performed at [facility], [address], [place], [place] [address]    Report Status 09/23/2022 FINAL  Final  Culture, body fluid w Gram Stain-bottle     Status: None (Preliminary result)   Collection Time: 09/23/22 10:43 AM   Specimen: Pleura  Result Value Ref Range Status   Specimen Description PLEURAL  Final   Special Requests  LEGT LUNG  Final   Culture   Final    NO GROWTH 2 DAYS Performed at  [name], [place] [address] [place], [place] [address]    Report Status PENDING  Incomplete    FURTHER DISCHARGE INSTRUCTIONS:  Get Medicines reviewed and adjusted: Please take all your medications with you for your next visit with your Primary MD  Laboratory/radiological data: Please request your Primary MD to go over all hospital tests and procedure/radiological results at the follow up, please ask your Primary MD to get all Hospital records sent to his/her office.  In some cases, they will be blood work, cultures and biopsy results pending at the time of your discharge. Please request that your primary care M.D. goes through all the records of your hospital data and follows up on these results.  Also Note the following: If you experience worsening of your admission symptoms, develop shortness of breath, life threatening emergency, suicidal or homicidal thoughts you must seek medical attention immediately by calling 911 or calling your MD immediately  if symptoms less severe.  You must read complete instructions/literature along with all the possible adverse reactions/side effects for all the Medicines you take and that have been prescribed to you. Take any new Medicines after you have completely understood and accpet all the possible adverse reactions/side effects.   Do not drive when taking Pain medications or sleeping medications (Benzodaizepines)  Do not take more than prescribed Pain, Sleep and Anxiety Medications. It is not advisable to combine anxiety,sleep  and pain medications without talking with your primary care practitioner  Special Instructions: If you have smoked or chewed Tobacco  in the last 2 yrs please stop smoking, stop any regular Alcohol  and or any Recreational drug use.  Wear Seat belts while driving.  Please note: You were cared for by a hospitalist during your hospital stay. Once you are discharged, your primary care physician will handle any further medical issues. Please note that NO REFILLS for any discharge medications will be authorized once you are discharged, as it is imperative that you return to your primary care physician (or establish a relationship with a primary care physician if you do not have one) for your post hospital discharge needs so that they can reassess your need for medications and monitor your lab values.  Total Time spent coordinating discharge including counseling, education and face to face time equals greater than 30 minutes.  SignedOren Binet 09/26/2022 8:55 AM

## 2022-09-26 NOTE — TOC Transition Note (Signed)
Transition of Care [facility]) - CM/SW Discharge Note   Patient Details  Name: Gregory [name] MRN: [id number] Date of Birth: Feb 12, 1961  [facility] CM/SW Contact:  Verdell Carmine, RN Phone Number: 09/26/2022, 10:51 AM   Clinical Narrative:    Patient continues to decline home health services, stating his family will take care of him. Patient will be discharged today.    Final next level of care: Home/Self Care Barriers to Discharge: Barriers Resolved   Patient Goals and CMS Choice Patient states their goals for this hospitalization and ongoing recovery are:: Go home      Discharge Placement                 Home self care      Discharge Plan and Services                          HH Arranged:  (Patient refusing HH at this time)          Social Determinants of Health (SDOH) Interventions     Readmission Risk Interventions    09/04/2022   11:14 AM 04/22/2022   12:49 PM  Readmission Risk Prevention Plan  Transportation Screening Complete Complete  PCP or Specialist Appt within 3-5 Days  Complete  HRI or [place]  Complete  Social Work Consult for [place] Planning/Counseling  Complete  Palliative Care Screening  Not Applicable  Medication Review Press photographer) Complete Complete  PCP or Specialist appointment within 3-5 days of discharge Complete   HRI or [place] Complete   SW Recovery Care/Counseling Consult Complete   [place] Not Applicable

## 2022-09-27 LAB — CULTURE, BLOOD (ROUTINE X 2)
Culture: NO GROWTH
Culture: NO GROWTH
Special Requests: ADEQUATE
Special Requests: ADEQUATE

## 2022-09-28 LAB — CULTURE, BODY FLUID W GRAM STAIN -BOTTLE: Culture: NO GROWTH

## 2022-09-29 NOTE — Telephone Encounter (Signed)
I submitted a PA request to [facility] and the response ws that the medication is covered. I will try to call them

## 2022-09-29 NOTE — Telephone Encounter (Signed)
No I don't. I can try to do one

## 2022-09-30 ENCOUNTER — Telehealth: Payer: Self-pay

## 2022-09-30 ENCOUNTER — Other Ambulatory Visit: Payer: Self-pay

## 2022-09-30 NOTE — Telephone Encounter (Signed)
LVM stating that Dr. Ernestina Penna office received his MyChart message regarding his Xanax.  Informed pt that Dr. Burr Medico did not prescribe the Xanax and unfortunately Dr. Burr Medico is out of the office this week.  Instructed pt to please contact his Cardiologist, Neurologist, or possibly his PCP to see if they can refill his Xanax.  This RN spoke with Cira Rue, NP as well regarding this and she agreed since Dr. Burr Medico never prescribed this medication for the patient.  Also, this RN responded to pt's MyChart message with this information as well.

## 2022-10-01 ENCOUNTER — Other Ambulatory Visit: Payer: Self-pay | Admitting: Nurse Practitioner

## 2022-10-01 ENCOUNTER — Other Ambulatory Visit: Payer: Self-pay

## 2022-10-01 MED ORDER — ALPRAZOLAM 0.25 MG PO TABS: 0.2500 mg | ORAL_TABLET | Freq: Every evening | ORAL | 0 refills | Status: DC | PRN

## 2022-10-02 ENCOUNTER — Ambulatory Visit (INDEPENDENT_AMBULATORY_CARE_PROVIDER_SITE_OTHER): Payer: Commercial Managed Care - HMO | Admitting: Neurology

## 2022-10-02 ENCOUNTER — Encounter: Payer: Self-pay | Admitting: Neurology

## 2022-10-02 ENCOUNTER — Other Ambulatory Visit (HOSPITAL_COMMUNITY): Payer: Self-pay

## 2022-10-02 VITALS — BP 156/102 | HR 75 | Ht 78.0 in | Wt 203.2 lb

## 2022-10-02 DIAGNOSIS — G4700 Insomnia, unspecified: Secondary | ICD-10-CM | POA: Diagnosis not present

## 2022-10-02 DIAGNOSIS — G40009 Localization-related (focal) (partial) idiopathic epilepsy and epileptic syndromes with seizures of localized onset, not intractable, without status epilepticus: Secondary | ICD-10-CM | POA: Diagnosis not present

## 2022-10-02 MED ORDER — TRAZODONE HCL 50 MG PO TABS: 50.0000 mg | ORAL_TABLET | Freq: Every day | ORAL | 3 refills | Status: DC

## 2022-10-02 MED ORDER — LEVETIRACETAM 750 MG PO TABS: ORAL_TABLET | ORAL | 3 refills | Status: DC

## 2022-10-02 NOTE — Patient Instructions (Signed)
Good to see you recovering well.   Continue Levetiracetam '750mg'$ : take 2 tablets twice a day  2. Restart the Trazodone '50mg'$  every night  3. Start weaning down the Xanax to 1 tablet every other night, then just take as needed, minimize as much as possible  4. Follow-up as scheduled in June 2024, call for any changes   Seizure Precautions: 1. If medication has been prescribed for you to prevent seizures, take it exactly as directed.  Do not stop taking the medicine without talking to your doctor first, even if you have not had a seizure in a long time.   2. Avoid activities in which a seizure would cause danger to yourself or to others.  Don't operate dangerous machinery, swim alone, or climb in high or dangerous places, such as on ladders, roofs, or girders.  Do not drive unless your doctor says you may.  3. If you have any warning that you may have a seizure, lay down in a safe place where you can't hurt yourself.    4.  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   Please refer to the following link on the [place] website for more information: http://www.epilepsyfoundation.org/answerplace/Social/driving/drivingu.cfm   5.  Maintain good sleep hygiene.  6.  Contact your doctor if you have any problems that may be related to the medicine you are taking.  7.  Call 911 and bring the patient back to the ED if:        A.  The seizure lasts longer than 5 minutes.       B.  The patient doesn't awaken shortly after the seizure  C.  The patient has new problems such as difficulty seeing, speaking or moving  D.  The patient was injured during the seizure  E.  The patient has a temperature over 102 F (39C)  F.  The patient vomited and now is having trouble breathing

## 2022-10-02 NOTE — Progress Notes (Signed)
NEUROLOGY FOLLOW UP OFFICE NOTE  Gregory [name] [id number] 03/07/1961  HISTORY OF PRESENT ILLNESS: I had the pleasure of seeing Gregory [name] in follow-up in the neurology clinic on 10/02/2022.  The patient was last seen 3 months ago for right temporal lobe epilepsy. He is accompanied by his daughter Gregory [name] who helps supplement the history today.  He presents for an earlier visit due to recent prolonged hospitalization for seizure and cardiac arrest. Records and images were personally reviewed where available.  He was admitted to [facility] on 08/30/22 for sudden onset aphasia. Gregory [name] reports her brother told her the patient was staring and slow to respond. When EMS arrived, he developed right gaze deviation and was given Versed. He was unresponsive in the ER then had a witnessed seizure and was given IV lorazepam and Levetiracetam. He became hypoxic after head CT and was intubated, then after returning from CT, he became pulseless, requiring one round of CPR and epinephrine. CT head showed hyperdense left MCA branch, CTA negative for LVO. Cardiac arrest felt secondary to electrolyte abnormalities. ICD download did not show episodes of VT. Video EEG for 48 hours showed diffuse slowing, no epileptiform activity. He was in rehab until 09/18/22 and discharged home on Levetiracetam '1500mg'$  BID. He was having a lot of anxiety and was started on Xanax while in rehab, Trazodone was stopped.   He is now staying with his daughter. They report he is doing quite well considering. No further seizures since hospitalization. He denies any gaps in time, olfactory/gustatory hallucinations, focal numbness/tingling/weakness, myoclonic jerks. No headaches, dizziness, vision changes, no falls. He has home PT and has a walker, but can ambulate without a walker, taking his own showers and getting up to the bathroom on his own. His daughter manages medications. He was previously on Trazodone to help with sleep but now  takes the 0.'25mg'$  Xanax which helps with sleep initiation but he is up after 2-3 hours. He is also on oxycodone every night for rib pain from CPR.     History on Initial Assessment 09/20/2018: This is a 61 year old right-handed man with a history of iron deficiency anemia, hypertension, CHF, CAD, s/p ICD placement, presenting after 2 episodes of unresponsiveness last 06/07/18. He reports that he had been lowering BP medication due to low BP and was feeling tired and dizzy. His last recollection was sitting in the car with his friend, then has no further recollection of events until he was in the ER. Notes reviewed, his friend noted he was staring straight ahead, unresponsive, then "he started talking like he was on a game show." He was back to baseline on arrival to the ER. While in the ER, nursing staff witnessed him to be staring off into space for around 20 seconds then had generalized tonic-clonic seizure-like activity that lasted for about a minute. He had slow sonorous breathing after and was then confused, gradually coming around. It was reported that he was hypotensive on the scene. BP on arrival to the ER was 103/62. He was significantly anemic, Hgb 6.9. I personally reviewed head CT without contrast which did not show any acute changes, there was mild chronic microvascular disease. His wake and drowsy EEG was normal. Echocardiogram showed an EF of 63-89%, grade 2 diastolic dysfunction, left atrium severely dilated. Since this was the first event, and possibly provoked (hypoperfusion), seizure medication was not started. He received a blood transfusion and states that he feels "perfect" since then. He and his son deny  any other staring/unresponsive episodes, gaps in time, olfactory/gustatory hallucinations, deja vu, rising epigastric sensation, focal numbness/tingling/weakness, myoclonic jerks.He denies any headaches, diplopia, dysarthria/dysphagia, neck/back pain, bowel/bladder dysfunction. He had a  normal birth and early development.  There is no history of febrile convulsions, CNS infections such as meningitis/encephalitis, significant traumatic brain injury, neurosurgical procedures, or family history of seizures.  Diagnostic Data: 24-hour EEG done October 2019 was abnormal with occasional focal slowing over the right frontotemporal region. There were 2 electrographic seizures from the right temporal region lasting 50-150 seconds. CT head no acute changes  PAST MEDICAL HISTORY: Past Medical History:  Diagnosis Date   AICD (automatic cardioverter/defibrillator) present 2005   CAD (coronary artery disease) 12/01/2013   Cardiac arrest ([place])    Chronic combined systolic and diastolic CHF, NYHA class 1 ([place]) 12/01/2013   Erectile dysfunction 12/01/2013   HTN (hypertension) 12/01/2013   Hyperlipidemia 12/01/2013   Ischemic cardiomyopathy 12/01/2013   Presence of permanent cardiac pacemaker    Sleep apnea     MEDICATIONS: Current Outpatient Medications on File Prior to Visit  Medication Sig Dispense Refill   acetaminophen (TYLENOL) 325 MG tablet Take 1-2 tablets (325-650 mg total) by mouth every 4 (four) hours as needed for mild pain.     ALPRAZolam (XANAX) 0.25 MG tablet Take 1 tablet (0.25 mg total) by mouth at bedtime as needed for anxiety or sleep. 30 tablet 0   amiodarone (PACERONE) 200 MG tablet Take 2 tablets (400 mg total) by mouth daily. 60 tablet 0   apixaban (ELIQUIS) 5 MG TABS tablet Take 1 tablet (5 mg total) by mouth 2 (two) times daily. 60 tablet 0   atorvastatin (LIPITOR) 80 MG tablet Take 1 tablet (80 mg total) by mouth every evening. 30 tablet 0   capecitabine (XELODA) 500 MG tablet Take 4 tabs every 12 hours for 14 days then off for 7 days, take after a meal 112 tablet 1   clopidogrel (PLAVIX) 75 MG tablet Take 1 tablet (75 mg total) by mouth daily. 30 tablet 1   dapagliflozin propanediol (FARXIGA) 10 MG TABS tablet Take 1 tablet (10 mg total) by mouth daily. 30  tablet 0   diclofenac Sodium (VOLTAREN) 1 % GEL Apply 2 g topically 4 (four) times daily. (Patient taking differently: Apply 2 g topically daily as needed (For pain).)     ezetimibe (ZETIA) 10 MG tablet Take 1 tablet (10 mg total) by mouth daily. 30 tablet 0   furosemide (LASIX) 40 MG tablet Take 1 tablet (40 mg total) by mouth daily. 30 tablet 0   levETIRAcetam (KEPPRA) 750 MG tablet Take 2 tablets (1,500 mg total) by mouth 2 (two) times daily. 120 tablet 0   levothyroxine (SYNTHROID) 50 MCG tablet Take 1 tablet (50 mcg total) by mouth daily at 6 (six) AM. 30 tablet 0   lidocaine (LMX) 4 % cream Apply topically 3 (three) times daily as needed (Apply to nostril for NG tube irritation). 30 g 0   metoprolol succinate (TOPROL-XL) 50 MG 24 hr tablet Take 1 tablet (50 mg total) by mouth 2 (two) times daily. Take with or immediately following a meal. 60 tablet 0   naloxone (NARCAN) nasal spray 4 mg/0.1 mL Use as needed in case of overdose 2 each 0   oxyCODONE-acetaminophen (PERCOCET/ROXICET) 5-325 MG tablet Take 1-2 tablets by mouth every 6 (six) hours as needed for moderate pain or severe pain. 30 tablet 0   pantoprazole (PROTONIX) 40 MG tablet Take 1 tablet (40 mg  total) by mouth daily. 30 tablet 0   polyethylene glycol (MIRALAX / GLYCOLAX) 17 g packet Take 17 g by mouth daily as needed for mild constipation.     potassium chloride (KLOR-CON M) 10 MEQ tablet Take 1 tablet (10 mEq total) by mouth daily. 30 tablet 0   sacubitril-valsartan (ENTRESTO) 49-51 MG Take 1 tablet by mouth 2 (two) times daily. 60 tablet 1   No current facility-administered medications on file prior to visit.    ALLERGIES: No Known Allergies  FAMILY HISTORY: Family History  Problem Relation Age of Onset   Heart disease Mother    Brain cancer Father    Hypertension Daughter     SOCIAL HISTORY: Social History   Socioeconomic History   Marital status: Single    Spouse name: Not on file   Number of children: Not on  file   Years of education: Not on file   Highest education level: Not on file  Occupational History   Not on file  Tobacco Use   Smoking status: Former    Packs/day: 1.00    Years: 30.00    Total pack years: 30.00    Types: Cigarettes    Quit date: 05/31/2013    Years since quitting: 9.3   Smokeless tobacco: Never  Vaping Use   Vaping Use: Never used  Substance and Sexual Activity   Alcohol use: Not Currently    Comment: 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    Right handed   Social Determinants of Health   Financial Resource Strain: High Risk (05/06/2022)   Overall Financial Resource Strain (CARDIA)    Difficulty of Paying Living Expenses: Hard  Food Insecurity: Not on file  Transportation Needs: Not on file  Physical Activity: Not on file  Stress: Not on file  Social Connections: Not on file  Intimate Partner Violence: Not on file     PHYSICAL EXAM: Vitals:   10/02/22 1113  BP: (!) 156/102  Pulse: 75  SpO2: 97%   General: No acute distress Head:  Normocephalic/atraumatic, pterygium on lateral sides of both eyes Skin/Extremities: No rash, no edema Neurological Exam: alert and awake. No aphasia or dysarthria. Fund of knowledge is appropriate.  Attention and concentration are normal.   Cranial nerves: Pupils equal, round. Extraocular movements intact with no nystagmus. Visual fields full.  No facial asymmetry.  Motor: Bulk and tone normal, muscle strength 5/5 throughout with no pronator drift.   Finger to nose testing intact.  Gait slightly wide-based, slow and cautious, no ataxia   IMPRESSION: This is a pleasant 61 yo RH man with a history of  iron deficiency anemia, hypertension,  CHF, CAD, VT s/p ICD placement,atrial fibrillation, metastatic colon cancer, with right temporal lobe epilepsy. He had been seizure-free for 4 years until he had a seizure on 08/30/22 then witnessed cardiac arrest while in the ER. Prolonged EEG after did not show any further seizure activity. He is now on Levetiracetam '1500mg'$  BID with no further seizures since 9/16, no side effects. MRI brain was deferred on his hospitalization as he was improved after extubation with no further aphasia. If seizures recur, would proceed with  MRI, model number of the patient's pacemaker is [id number]. He is having insomnia again, Trazodone had helped in the past. Restart Trazodone '50mg'$  qhs, he was instructed to start weaning down daily Xanax intake and minimize prn use. He does not drive. Follow-up as scheduled in June 2024, call for any changes.    Thank you for allowing me to participate in his care.  Please do not hesitate to call for any questions or concerns.    Ellouise Newer, M.D.   CC: Reesa Chew, PA-C

## 2022-10-06 ENCOUNTER — Encounter: Payer: Self-pay | Admitting: Internal Medicine

## 2022-10-06 ENCOUNTER — Other Ambulatory Visit: Payer: Self-pay

## 2022-10-06 MED ORDER — AMIODARONE HCL 200 MG PO TABS: 400.0000 mg | ORAL_TABLET | Freq: Every day | ORAL | 1 refills | Status: DC

## 2022-10-14 ENCOUNTER — Other Ambulatory Visit (HOSPITAL_COMMUNITY): Payer: Self-pay

## 2022-10-14 ENCOUNTER — Encounter: Payer: Self-pay | Admitting: Hematology

## 2022-10-15 ENCOUNTER — Other Ambulatory Visit (HOSPITAL_COMMUNITY): Payer: Self-pay

## 2022-10-15 ENCOUNTER — Telehealth: Payer: Self-pay | Admitting: *Deleted

## 2022-10-15 NOTE — Telephone Encounter (Signed)
Attachment received via e-mail of FMLA form.   "The following FMLA documents are for Shaquan Missey in regards to NASRI BOAKYE, DOB 1961-09-13.   Sent from my iPhone"  Reply e-mail sent:       "I am a forms nurse with Dr. Burr Medico of the [employer].  [facility] received [employer] .  Per H.I.P.A.A. guidelines, any release to third parties Bebe Liter) of patient (PHI) protected health information by any means, electronic, verbal or paper, a signed "[place] for Use/Disclosure" is required to comply.  Your father can sign the authorization form when he arrives tomorrow at the office entry receptionist desk or use attachments.  We ask patients to allow 7 - 10 business days or up to 14 calendar to process forms.   Thank you."

## 2022-10-16 ENCOUNTER — Other Ambulatory Visit: Payer: Self-pay | Admitting: Physician Assistant

## 2022-10-17 ENCOUNTER — Other Ambulatory Visit: Payer: Self-pay

## 2022-10-17 ENCOUNTER — Encounter: Payer: Self-pay | Admitting: Hematology

## 2022-10-17 ENCOUNTER — Inpatient Hospital Stay: Payer: Commercial Managed Care - HMO | Attending: Hematology

## 2022-10-17 ENCOUNTER — Other Ambulatory Visit (HOSPITAL_COMMUNITY): Payer: Self-pay

## 2022-10-17 ENCOUNTER — Inpatient Hospital Stay (HOSPITAL_BASED_OUTPATIENT_CLINIC_OR_DEPARTMENT_OTHER): Payer: Commercial Managed Care - HMO | Admitting: Hematology

## 2022-10-17 VITALS — BP 143/106 | HR 76 | Temp 98.0°F | Resp 16 | Ht 78.0 in | Wt 205.6 lb

## 2022-10-17 DIAGNOSIS — K648 Other hemorrhoids: Secondary | ICD-10-CM | POA: Diagnosis not present

## 2022-10-17 DIAGNOSIS — Z7901 Long term (current) use of anticoagulants: Secondary | ICD-10-CM | POA: Diagnosis not present

## 2022-10-17 DIAGNOSIS — C787 Secondary malignant neoplasm of liver and intrahepatic bile duct: Secondary | ICD-10-CM | POA: Insufficient documentation

## 2022-10-17 DIAGNOSIS — I2510 Atherosclerotic heart disease of native coronary artery without angina pectoris: Secondary | ICD-10-CM | POA: Diagnosis not present

## 2022-10-17 DIAGNOSIS — K651 Peritoneal abscess: Secondary | ICD-10-CM | POA: Insufficient documentation

## 2022-10-17 DIAGNOSIS — I5042 Chronic combined systolic (congestive) and diastolic (congestive) heart failure: Secondary | ICD-10-CM

## 2022-10-17 DIAGNOSIS — I700 Atherosclerosis of aorta: Secondary | ICD-10-CM | POA: Diagnosis not present

## 2022-10-17 DIAGNOSIS — D124 Benign neoplasm of descending colon: Secondary | ICD-10-CM | POA: Insufficient documentation

## 2022-10-17 DIAGNOSIS — Z79890 Hormone replacement therapy (postmenopausal): Secondary | ICD-10-CM | POA: Insufficient documentation

## 2022-10-17 DIAGNOSIS — D125 Benign neoplasm of sigmoid colon: Secondary | ICD-10-CM | POA: Insufficient documentation

## 2022-10-17 DIAGNOSIS — I110 Hypertensive heart disease with heart failure: Secondary | ICD-10-CM | POA: Insufficient documentation

## 2022-10-17 DIAGNOSIS — R5383 Other fatigue: Secondary | ICD-10-CM | POA: Insufficient documentation

## 2022-10-17 DIAGNOSIS — I469 Cardiac arrest, cause unspecified: Secondary | ICD-10-CM | POA: Insufficient documentation

## 2022-10-17 DIAGNOSIS — D509 Iron deficiency anemia, unspecified: Secondary | ICD-10-CM

## 2022-10-17 DIAGNOSIS — D123 Benign neoplasm of transverse colon: Secondary | ICD-10-CM | POA: Insufficient documentation

## 2022-10-17 DIAGNOSIS — C182 Malignant neoplasm of ascending colon: Secondary | ICD-10-CM

## 2022-10-17 DIAGNOSIS — D649 Anemia, unspecified: Secondary | ICD-10-CM | POA: Diagnosis not present

## 2022-10-17 DIAGNOSIS — R569 Unspecified convulsions: Secondary | ICD-10-CM | POA: Insufficient documentation

## 2022-10-17 DIAGNOSIS — Z7902 Long term (current) use of antithrombotics/antiplatelets: Secondary | ICD-10-CM | POA: Insufficient documentation

## 2022-10-17 DIAGNOSIS — Z79899 Other long term (current) drug therapy: Secondary | ICD-10-CM | POA: Insufficient documentation

## 2022-10-17 DIAGNOSIS — T8143XA Infection following a procedure, organ and space surgical site, initial encounter: Secondary | ICD-10-CM | POA: Diagnosis not present

## 2022-10-17 LAB — CBC WITH DIFFERENTIAL/PLATELET
Abs Immature Granulocytes: 0.02 10*3/uL (ref 0.00–0.07)
Basophils Absolute: 0 10*3/uL (ref 0.0–0.1)
Basophils Relative: 0 %
Eosinophils Absolute: 0.1 10*3/uL (ref 0.0–0.5)
Eosinophils Relative: 1 %
HCT: 45.4 % (ref 39.0–52.0)
Hemoglobin: 15.1 g/dL (ref 13.0–17.0)
Immature Granulocytes: 0 %
Lymphocytes Relative: 21 %
Lymphs Abs: 1.8 10*3/uL (ref 0.7–4.0)
MCH: 33.5 pg (ref 26.0–34.0)
MCHC: 33.3 g/dL (ref 30.0–36.0)
MCV: 100.7 fL — ABNORMAL HIGH (ref 80.0–100.0)
Monocytes Absolute: 0.5 10*3/uL (ref 0.1–1.0)
Monocytes Relative: 6 %
Neutro Abs: 6.3 10*3/uL (ref 1.7–7.7)
Neutrophils Relative %: 72 %
Platelets: 218 10*3/uL (ref 150–400)
RBC: 4.51 MIL/uL (ref 4.22–5.81)
RDW: 21.2 % — ABNORMAL HIGH (ref 11.5–15.5)
WBC: 8.8 10*3/uL (ref 4.0–10.5)
nRBC: 0 % (ref 0.0–0.2)

## 2022-10-17 LAB — COMPREHENSIVE METABOLIC PANEL
ALT: 30 U/L (ref 0–44)
AST: 24 U/L (ref 15–41)
Albumin: 4.1 g/dL (ref 3.5–5.0)
Alkaline Phosphatase: 159 U/L — ABNORMAL HIGH (ref 38–126)
Anion gap: 7 (ref 5–15)
BUN: 16 mg/dL (ref 8–23)
CO2: 32 mmol/L (ref 22–32)
Calcium: 9.3 mg/dL (ref 8.9–10.3)
Chloride: 100 mmol/L (ref 98–111)
Creatinine, Ser: 1.09 mg/dL (ref 0.61–1.24)
GFR, Estimated: >60 mL/min (ref >60)
Glucose, Bld: 97 mg/dL (ref 70–99)
Potassium: 4.4 mmol/L (ref 3.5–5.1)
Sodium: 139 mmol/L (ref 135–145)
Total Bilirubin: 0.7 mg/dL (ref 0.3–1.2)
Total Protein: 8.2 g/dL — ABNORMAL HIGH (ref 6.5–8.1)

## 2022-10-17 LAB — FERRITIN: Ferritin: 60 ng/mL (ref 24–336)

## 2022-10-17 LAB — CEA (IN HOUSE-CHCC): CEA (CHCC-In House): 6.02 ng/mL — ABNORMAL HIGH (ref 0.00–5.00)

## 2022-10-17 MED ORDER — CAPECITABINE 500 MG PO TABS
ORAL_TABLET | ORAL | 1 refills | Status: AC
  Filled 2022-10-17: qty 112, fill #0
  Filled 2022-11-05: qty 112, 21d supply, fill #0
  Filled 2022-12-18: qty 112, 21d supply, fill #1

## 2022-10-17 MED ORDER — TRAZODONE HCL 50 MG PO TABS: 50.0000 mg | ORAL_TABLET | Freq: Every day | ORAL | 3 refills | Status: AC

## 2022-10-17 MED ORDER — LEVETIRACETAM 750 MG PO TABS: ORAL_TABLET | ORAL | 3 refills | Status: AC

## 2022-10-17 MED ORDER — AMIODARONE HCL 200 MG PO TABS: 400.0000 mg | ORAL_TABLET | Freq: Every day | ORAL | 1 refills | Status: DC

## 2022-10-17 MED ORDER — POTASSIUM CHLORIDE CRYS ER 10 MEQ PO TBCR: 10.0000 meq | EXTENDED_RELEASE_TABLET | Freq: Every day | ORAL | 1 refills | Status: AC

## 2022-10-17 MED ORDER — FUROSEMIDE 40 MG PO TABS: 40.0000 mg | ORAL_TABLET | Freq: Every day | ORAL | 1 refills | Status: AC

## 2022-10-17 NOTE — Progress Notes (Signed)
Gregory [name]   [phone] Fax:(336) [phone]   Clinic Follow up Note   Patient Care Team: Patient, No Pcp Per as PCP - General (General Practice) Croitoru, Dani Gobble, MD as PCP - Cardiology (Cardiology) [name], MD as PCP - Electrophysiology (Cardiology) [name], MD as Consulting Physician (Neurology) [name], MD as Consulting Physician (Hematology) Stechschulte, Nickola Major, MD as Consulting Physician (Surgery)  Date of Service:  10/17/2022  CHIEF COMPLAINT: f/u of colon cancer  CURRENT THERAPY:  Xeloda, q21d, on 07/21/22             -dose: 2064m BID on days 1-14  ASSESSMENT & PLAN:  Gregory [name] a 61y.o. male with   1. 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 S/p resection on 04/18/22 by Dr. SThermon Leyland path showed: 6.4 cm moderate to poorly differentiated adenocarcinoma; 11/35 positive nodes; lymphovascular invasion present; margins negative. MMR normal. Of note, appendix was uninvolved.  -postoperative course complicated by intra-abdominal abscess, which required drain placement. -he started adjuvant Xeloda on 07/21/22. He tolerated well with fatigue and worsening skin peeling in feet. He notes skin dryness was present prior to starting treatment. -staging PET scan 07/24/22 showed: numerous hepatic metastatic lesions, two small hypermetabolic upper abdominal lymph nodes; no metastatic disease in chest or bony structures. 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 -CT angio chest and AP 09/22/22 while in the hospital showed: unchanged presumed metastatic lesions in liver and adrenal glands; enlargement of complex cystic masses in LUQ around spleen, likely metastases but abscess  not ruled out. -s/p thoracentesis 10/10, cytology was benign. -given his recent hospitalizations, I do not plan to add iv chemo until he progresses on Xeloda. -he has recovered well from his hospital stays. He has since completed another cycle of Xeloda, last dose yesterday, 11/2. Plan to continue at same dose. -he is not a candidate for Avastin due to his recent cardiac event    2. Recent Hospitalization 9/16 - 10/5 -presented with seizure, experienced stroke, cardiac arrest, and aspiration pneumonia in the hospital. -he is on levetiracetam and followed by Dr. ADelice Lesch   3. CHF, CAD, HTN, s/p AICD, seizure  -continue meds and f/u with PCP and other specialists   PLAN: -start next cycle Xeloda next week, continue same dose and same schedule. -lab and f/u week of 11/27   No problem-specific Assessment & Plan notes found for this encounter.   SUMMARY OF ONCOLOGIC HISTORY: Oncology History Overview Note   Cancer Staging  Cancer of right colon ([facility] Staging form: Colon and Rectum, AJCC 8th Edition - Pathologic stage from 04/22/2022: pT3, pN2b, cM1 - Signed by FTruitt Merle MD on 05/05/2022    [place] right [place] ([place]  04/12/2022 Miscellaneous   Fecal Occult Bld - POSITIVE   04/16/2022 Imaging   EXAM: CT CHEST, ABDOMEN, AND PELVIS WITH CONTRAST  IMPRESSION: 1. 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 2. Bilateral adrenal nodules which are indeterminate, but potentially metastatic. 3. Multiple hypovascular lesions scattered throughout the pancreas. The  possibility of metastatic disease to the pancreas should be considered. Alternatively, benign etiologies such as chronic pancreatic  pseudocysts could have a similar appearance (no recent prior studies are available for comparison). Close attention on follow-up studies is recommended to ensure the stability or regression of these pancreatic lesions. 4. Small to moderate left pleural effusion lying dependently with some passive subsegmental atelectasis in the left lower lobe. 5. Cardiomegaly with left atrial dilatation. 6. Aortic atherosclerosis, in addition to left main and three-vessel coronary artery disease. Please note that although the presence of coronary artery calcium documents the presence of coronary artery disease, the severity of this disease and any potential stenosis cannot be assessed on this non-gated CT examination. Assessment for potential risk factor modification, dietary therapy or pharmacologic therapy may be warranted, if clinically indicated. 7. There are calcifications of the aortic valve. Echocardiographic correlation for evaluation of potential valvular dysfunction may be warranted if clinically indicated. 8. Additional incidental findings, as above.   04/16/2022 Procedure   Colonoscopy; Dr. Havery Moros  Impression: -Stool in the entire examined colon, several minutes spent lavaging the colon - Likely malignant partially obstructing tumor in the suspected proximal transverse colon vs. right colon as above - could not traverse it. Biopsied. Tattooed. - Five 3 to 10 mm polyps in the transverse colon, removed with a cold snare. Resected and retrieved. - Two 6 to 8 mm polyps in the transverse colon, removed with a hot snare. Resected and retrieved. - One 6 mm polyp in the descending colon, removed with a cold snare. Resected and retrieved. - Two 3 to 4 mm polyps in the sigmoid colon, removed with a cold snare. Resected and retrieved. - One 15 mm polyp in the sigmoid colon, removed with a hot snare. Resected and retrieved. - One 3 to 4 mm polyp at the recto-sigmoid colon, removed with a cold snare.  Resected and retrieved. - Internal hemorrhoids. - The examination was otherwise normal.  Colon mass is likely malignant and the cause of anemia / bleeding.   04/16/2022 Initial Biopsy   FINAL MICROSCOPIC DIAGNOSIS:   A. COLON MASS, BIOPSY:  - Moderately differentiated colonic adenocarcinoma.  - See comment.   B. COLON, TRANSVERSE, DESCENDING AND SIGMOID, POLYPECTOMY:  - Tubular adenoma (s) without high grade dysplasia.  - Sessile serrated adenoma(s) without cytologic dysplasia.   C. COLON, SIGMOID, POLYPECTOMY:  - Tubular adenoma without high grade dysplasia.    04/17/2022 Initial Diagnosis   Cancer of right colon ([place])   04/18/2022 Definitive Surgery   FINAL MICROSCOPIC DIAGNOSIS:   A. COLON, RIGHT, RESECTION:  -  Adenocarcinoma, moderate to poorly differentiated, 6.4 cm  -  Metastatic adenocarcinoma involving eleven of thirty-five lymph nodes (11/35)  -  Lymphovascular space invasion present  -  Margins uninvolved by carcinoma  -  Multiple tubular adenomas  -  Appendix uninvolved by adenocarcinoma  -  See oncology table and comment below   ADDENDUM:  Mismatch Repair Protein (IHC)  SUMMARY INTERPRETATION: NORMAL   04/22/2022 Cancer Staging   Staging form: Colon and Rectum, AJCC 8th Edition - Pathologic stage from 04/22/2022: pT3, pN2b, cM1 - Signed by [name], MD on 05/05/2022 Stage prefix: Initial diagnosis Histologic grading system: 4 grade system Histologic grade (G): G3 Residual tumor (R): R0 - None      INTERVAL HISTORY:  [facility] is here for a follow up of colon cancer. He was last seen by me on 09/19/22. He presents to the clinic accompanied by his son. He  reports he is recovering from recent hospital stay. He notes his energy is low, and he spends most of the time in the chair. He adds he is able to do most things for himself.  He denies any SOB from Covid or during hospitalization.   All other systems were reviewed with the patient and are  negative.  MEDICAL HISTORY:  Past Medical History:  Diagnosis Date   AICD (automatic cardioverter/defibrillator) present 2005   CAD (coronary artery disease) 12/01/2013   Cardiac arrest ([place])    Chronic combined systolic and diastolic CHF, NYHA class 1 ([place]) 12/01/2013   Erectile dysfunction 12/01/2013   HTN (hypertension) 12/01/2013   Hyperlipidemia 12/01/2013   Ischemic cardiomyopathy 12/01/2013   Presence of permanent cardiac pacemaker    Sleep apnea     SURGICAL HISTORY: Past Surgical History:  Procedure Laterality Date   ACHILLES TENDON REPAIR     lft foot   BIOPSY  04/16/2022   Procedure: BIOPSY;  Surgeon: Yetta Flock, MD;  Location: [place];  Service: Gastroenterology;;   CARDIAC CATHETERIZATION N/A 05/05/2016   Procedure: Left Heart Cath and Coronary Angiography;  Surgeon: Leonie Man, MD;  Location: [place] [facility];  Service: Cardiovascular;  Laterality: N/A;   CARDIAC DEFIBRILLATOR PLACEMENT     CARDIOVERSION N/A 09/13/2020   Procedure: CARDIOVERSION;  Surgeon: Werner Lean, MD;  Location: [place] [facility];  Service: Cardiovascular;  Laterality: N/A;   CARDIOVERSION N/A 09/19/2020   Procedure: CARDIOVERSION;  Surgeon: [name], MD;  Location: [facility] ENDOSCOPY;  Service: Cardiovascular;  Laterality: N/A;   COLON RESECTION N/A 04/18/2022   Procedure: HAND ASSISTED LAPAROSCOPIC RIGHT COLON RESECTION;  Surgeon: Felicie Morn, MD;  Location: [place];  Service: General;  Laterality: N/A;   COLONOSCOPY WITH PROPOFOL N/A 04/16/2022   Procedure: COLONOSCOPY WITH PROPOFOL;  Surgeon: Yetta Flock, MD;  Location: [place];  Service: Gastroenterology;  Laterality: N/A;   EP IMPLANTABLE DEVICE N/A 12/19/2016   Procedure: ICD Generator Changeout;  Surgeon: [name], MD;  Location: [place] [facility];  Service: Cardiovascular;  Laterality: N/A;   ESOPHAGOGASTRODUODENOSCOPY N/A 04/16/2022   Procedure: ESOPHAGOGASTRODUODENOSCOPY (EGD);  Surgeon:  Yetta Flock, MD;  Location[facility] ENDOSCOPY;  Service: Gastroenterology;  Laterality: N/A;   IR ANGIOGRAM SELECTIVE EACH ADDITIONAL VESSEL  05/09/2022   IR ANGIOGRAM VISCERAL SELECTIVE  05/09/2022   IR CATHETER TUBE CHANGE  05/14/2022   IR CATHETER TUBE CHANGE  06/27/2022   IR EMBO ART  VEN HEMORR LYMPH EXTRAV  INC GUIDE ROADMAPPING  05/09/2022   IR IMAGE GUIDED DRAINAGE PERCUT CATH  PERITONEAL RETROPERIT  05/09/2022   IR PATIENT EVAL TECH 0-60 MINS  06/11/2022   IR RADIOLOGIST EVAL & MGMT  06/26/2022   IR RADIOLOGIST EVAL & MGMT  07/16/2022   IR THORACENTESIS ASP PLEURAL SPACE W/IMG GUIDE  09/23/2022   IR US GUIDE VASC ACCESS RIGHT  05/09/2022   LEFT HEART CATH AND CORONARY ANGIOGRAPHY N/A 04/17/2022   Procedure: LEFT HEART CATH AND CORONARY ANGIOGRAPHY;  Surgeon: Belva Crome, MD;  Location: [place] [facility];  Service: Cardiovascular;  Laterality: N/A;   POLYPECTOMY  04/16/2022   Procedure: POLYPECTOMY;  Surgeon: Yetta Flock, MD;  Location: [facility] ENDOSCOPY;  Service: Gastroenterology;;   RIGHT/LEFT HEART CATH AND CORONARY ANGIOGRAPHY N/A 09/24/2020   Procedure: RIGHT/LEFT HEART CATH AND CORONARY ANGIOGRAPHY;  Surgeon: Lorretta Harp, MD;  Location: [place] [facility];  Service: Cardiovascular;  Laterality: N/A;   SUBMUCOSAL TATTOO INJECTION  04/16/2022   Procedure: SUBMUCOSAL TATTOO INJECTION;  Surgeon: Yetta Flock, MD;  Location: [facility] ENDOSCOPY;  Service: Gastroenterology;;   WRIST TENODESIS      I have reviewed the social history and family history with the patient and they are unchanged from previous note.  ALLERGIES:  has No Known Allergies.  MEDICATIONS:  Current Outpatient Medications  Medication Sig Dispense Refill   acetaminophen (TYLENOL) 325 MG tablet Take 1-2 tablets (325-650 mg total) by mouth every 4 (four) hours as needed for mild pain.     ALPRAZolam (XANAX) 0.25 MG tablet Take 1 tablet (0.25 mg total) by mouth at bedtime as needed for anxiety or sleep. 30  tablet 0   amiodarone (PACERONE) 200 MG tablet Take 2 tablets (400 mg total) by mouth daily. 180 tablet 1   apixaban (ELIQUIS) 5 MG TABS tablet Take 1 tablet (5 mg total) by mouth 2 (two) times daily. 60 tablet 0   atorvastatin (LIPITOR) 80 MG tablet Take 1 tablet (80 mg total) by mouth every evening. 30 tablet 0   capecitabine (XELODA) 500 MG tablet Take 4 tabs every 12 hours for 14 days then off for 7 days, take after a meal 112 tablet 1   clopidogrel (PLAVIX) 75 MG tablet Take 1 tablet (75 mg total) by mouth daily. 30 tablet 1   dapagliflozin propanediol (FARXIGA) 10 MG TABS tablet Take 1 tablet (10 mg total) by mouth daily. 30 tablet 0   diclofenac Sodium (VOLTAREN) 1 % GEL Apply 2 g topically 4 (four) times daily. (Patient taking differently: Apply 2 g topically daily as needed (For pain).)     ezetimibe (ZETIA) 10 MG tablet Take 1 tablet (10 mg total) by mouth daily. 30 tablet 0   furosemide (LASIX) 40 MG tablet Take 1 tablet (40 mg total) by mouth daily. 90 tablet 1   levETIRAcetam (KEPPRA) 750 MG tablet Take 2 tablets twice a day 360 tablet 3   levothyroxine (SYNTHROID) 50 MCG tablet Take 1 tablet (50 mcg total) by mouth daily at 6 (six) AM. 30 tablet 0   lidocaine (LMX) 4 % cream Apply topically 3 (three) times daily as needed (Apply to nostril for NG tube irritation). 30 g 0   metoprolol succinate (TOPROL-XL) 50 MG 24 hr tablet Take 1 tablet (50 mg total) by mouth 2 (two) times daily. Take with or immediately following a meal. 60 tablet 0   naloxone (NARCAN) nasal spray 4 mg/0.1 mL Use as needed in case of overdose 2 each 0   pantoprazole (PROTONIX) 40 MG tablet Take 1 tablet (40 mg total) by mouth daily. (Patient not taking: Reported on 10/02/2022) 30 tablet 0   polyethylene glycol (MIRALAX / GLYCOLAX) 17 g packet Take 17 g by mouth daily as needed for mild constipation.     potassium chloride (KLOR-CON M) 10 MEQ tablet Take 1 tablet (10 mEq total) by mouth daily. 90 tablet 1    sacubitril-valsartan (ENTRESTO) 49-51 MG Take 1 tablet by mouth 2 (two) times daily. 60 tablet 1   traZODone (DESYREL) 50 MG tablet Take 1 tablet (50 mg total) by mouth at bedtime. 90 tablet 3   No current facility-administered medications for this visit.    PHYSICAL EXAMINATION: ECOG PERFORMANCE STATUS: 2 - Symptomatic, <50% confined to bed  Vitals:   10/17/22 1434  BP: (!) 143/106  Pulse: 76  Resp: 16  Temp: 98 F (36.7 C)  SpO2: 99%   Wt Readings from Last 3 Encounters:  10/17/22 205  lb 9.6 oz (93.3 kg)  10/02/22 203 lb 3.2 oz (92.2 kg)  09/22/22 190 lb (86.2 kg)     GENERAL:alert, no distress and comfortable SKIN: skin color, texture, turgor are normal, no rashes or significant lesions EYES: normal, Conjunctiva are pink and non-injected, sclera clear  LUNGS: clear to auscultation and percussion with normal breathing effort HEART: regular rate & rhythm and no murmurs and no lower extremity edema ABDOMEN:abdomen soft, non-tender and normal bowel sounds Musculoskeletal:no cyanosis of digits and no clubbing  NEURO: alert & oriented x 3 with fluent speech, no focal motor/sensory deficits  LABORATORY DATA:  I have reviewed the data as listed    Latest Ref Rng & Units 10/17/2022    2:17 PM 09/26/2022    6:00 AM 09/25/2022    6:11 AM  CBC  WBC 4.0 - 10.5 K/uL 8.8  5.9  5.8   Hemoglobin 13.0 - 17.0 g/dL 15.1  12.3  12.0   Hematocrit 39.0 - 52.0 % 45.4  36.7  36.3   Platelets 150 - 400 K/uL 218  231  240         Latest Ref Rng & Units 10/17/2022    2:17 PM 09/26/2022    6:00 AM 09/25/2022    6:11 AM  CMP  Glucose 70 - 99 mg/dL 97  136  160   BUN 8 - 23 mg/dL _0 Creatinine 0.61 - 1.24 mg/dL 1.09  0.74  0.92   Sodium 135 - 145 mmol/L 139  136  136   Potassium 3.5 - 5.1 mmol/L 4.4  4.1  4.3   Chloride 98 - 111 mmol/L 100  107  104   CO2 22 - 32 mmol/L 32  24  22   Calcium 8.9 - 10.3 mg/dL 9.3  7.8  7.9   Total Protein 6.5 - 8.1 g/dL 8.2  6.2  6.4   Total  Bilirubin 0.3 - 1.2 mg/dL 0.7  0.5  0.5   Alkaline Phos 38 - 126 U/L 159  142  136   AST 15 - 41 U/L 24  66  148   ALT 0 - 44 U/L 30  103  147       RADIOGRAPHIC STUDIES: I have personally reviewed the radiological images as listed and agreed with the findings in the report. No results found.    No orders of the defined types were placed in this encounter.  All questions were answered. The patient knows to call the clinic with any problems, questions or concerns. No barriers to learning was detected. The total time spent in the appointment was 30 minutes.     [name], MD 10/17/2022   I, Wilburn Mylar, am acting as scribe for [name], MD.   I have reviewed the above documentation for accuracy and completeness, and I agree with the above.

## 2022-10-20 ENCOUNTER — Telehealth: Payer: Self-pay | Admitting: Hematology

## 2022-10-20 ENCOUNTER — Other Ambulatory Visit: Payer: Self-pay | Admitting: Hematology

## 2022-10-20 MED ORDER — LEVOTHYROXINE SODIUM 50 MCG PO TABS: 50.0000 ug | ORAL_TABLET | Freq: Every day | ORAL | 0 refills | Status: DC

## 2022-10-20 NOTE — Telephone Encounter (Signed)
Spoke to patient confirming upcoming appointments

## 2022-10-22 ENCOUNTER — Other Ambulatory Visit: Payer: Self-pay | Admitting: *Deleted

## 2022-10-22 ENCOUNTER — Other Ambulatory Visit: Payer: Self-pay

## 2022-10-22 DIAGNOSIS — I480 Paroxysmal atrial fibrillation: Secondary | ICD-10-CM

## 2022-10-22 MED ORDER — SACUBITRIL-VALSARTAN 49-51 MG PO TABS: 1.0000 | ORAL_TABLET | Freq: Two times a day (BID) | ORAL | 1 refills | Status: AC

## 2022-10-22 MED ORDER — METOPROLOL SUCCINATE ER 50 MG PO TB24: 50.0000 mg | ORAL_TABLET | Freq: Two times a day (BID) | ORAL | 1 refills | Status: AC

## 2022-10-22 MED ORDER — ATORVASTATIN CALCIUM 80 MG PO TABS: 80.0000 mg | ORAL_TABLET | Freq: Every evening | ORAL | 1 refills | Status: AC

## 2022-10-22 MED ORDER — APIXABAN 5 MG PO TABS: 5.0000 mg | ORAL_TABLET | Freq: Two times a day (BID) | ORAL | 1 refills | Status: AC

## 2022-10-22 MED ORDER — EZETIMIBE 10 MG PO TABS: 10.0000 mg | ORAL_TABLET | Freq: Every day | ORAL | 1 refills | Status: AC

## 2022-10-22 MED ORDER — DAPAGLIFLOZIN PROPANEDIOL 10 MG PO TABS: 10.0000 mg | ORAL_TABLET | Freq: Every day | ORAL | 1 refills | Status: AC

## 2022-10-22 MED ORDER — CLOPIDOGREL BISULFATE 75 MG PO TABS: 75.0000 mg | ORAL_TABLET | Freq: Every day | ORAL | 1 refills | Status: AC

## 2022-10-22 NOTE — Telephone Encounter (Signed)
Eliquis '5mg'$  refill request received from [facility]. Patient is 61 years old, weight-93.3kg, Crea-1.09 on 10/17/2022 , Diagnosis-Afib, and last seen by Dr. Lovena Le on 03/21/2022. Dose is appropriate based on dosing criteria. Will send in refill to requested pharmacy.

## 2022-10-23 MED FILL — Medication: Qty: 1 | Status: AC

## 2022-11-05 ENCOUNTER — Other Ambulatory Visit (HOSPITAL_COMMUNITY): Payer: Self-pay

## 2022-11-11 ENCOUNTER — Ambulatory Visit (INDEPENDENT_AMBULATORY_CARE_PROVIDER_SITE_OTHER): Payer: PRIVATE HEALTH INSURANCE

## 2022-11-11 DIAGNOSIS — I255 Ischemic cardiomyopathy: Secondary | ICD-10-CM | POA: Diagnosis not present

## 2022-11-11 LAB — CUP PACEART REMOTE DEVICE CHECK
Battery Remaining Longevity: 44 mo
Battery Voltage: 2.93 V
Brady Statistic AP VP Percent: 0.07 %
Brady Statistic AP VS Percent: 29.46 %
Brady Statistic AS VP Percent: 0.09 %
Brady Statistic AS VS Percent: 70.38 %
Brady Statistic RA Percent Paced: 29.53 %
Brady Statistic RV Percent Paced: 0.18 %
Date Time Interrogation Session: 20231128043824
HighPow Impedance: 38 Ohm
HighPow Impedance: 51 Ohm
Implantable Lead Connection Status: 753985
Implantable Lead Connection Status: 753985
Implantable Lead Implant Date: 20051120
Implantable Lead Implant Date: 20101117
Implantable Lead Location: 753859
Implantable Lead Location: 753860
Implantable Lead Model: 5076
Implantable Lead Model: 7121
Implantable Pulse Generator Implant Date: 20180105
Lead Channel Impedance Value: 247 Ohm
Lead Channel Impedance Value: 361 Ohm
Lead Channel Impedance Value: 399 Ohm
Lead Channel Pacing Threshold Amplitude: 0.5 V
Lead Channel Pacing Threshold Amplitude: 0.875 V
Lead Channel Pacing Threshold Pulse Width: 0.4 ms
Lead Channel Pacing Threshold Pulse Width: 0.4 ms
Lead Channel Sensing Intrinsic Amplitude: 12.625 mV
Lead Channel Sensing Intrinsic Amplitude: 12.625 mV
Lead Channel Sensing Intrinsic Amplitude: 3 mV
Lead Channel Sensing Intrinsic Amplitude: 3 mV
Lead Channel Setting Pacing Amplitude: 1.5 V
Lead Channel Setting Pacing Amplitude: 2 V
Lead Channel Setting Pacing Pulse Width: 0.4 ms
Lead Channel Setting Sensing Sensitivity: 0.6 mV

## 2022-11-13 NOTE — Progress Notes (Signed)
[place]   Telephone:([phone]) [phone] Fax:(336) [phone]   Clinic Follow up Note   Patient Care Team: Patient, No Pcp Per as PCP - General (General Practice) Croitoru, Dani Gobble, MD as PCP - Cardiology (Cardiology) [name], MD as PCP - Electrophysiology (Cardiology) [name], MD as Consulting Physician (Neurology) [name], MD as Consulting Physician (Hematology) Stechschulte, Nickola Major, MD as Consulting Physician (Surgery)  Date of Service:  11/14/2022  CHIEF COMPLAINT: f/u of colon cancer   CURRENT THERAPY:  Xeloda, q21d, on 07/21/22             -dose: 20105m BID on days 1-14  ASSESSMENT:  Gregory WChannin Agustinis a 61y.o. 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 S/p resection on 04/18/22 by Dr. SThermon Leyland-postoperative course complicated by intra-abdominal abscess, which required drain placement. -he started adjuvant Xeloda on 07/21/22, had multiple interruption due to medical illness, continued due to metastatic disease now   Chronic combined systolic and diastolic congestive heart failure ([facility] -Due to CAD, HTN, s/p AICD  -EF 35-40% F/U with cardiology      PLAN: -lab reviewed  -He WILL START next cycle Xeloda on Monday, December 4 -lab,EKG,and f/u with me or Cassie in 5wks - I refill Xanax  SUMMARY OF ONCOLOGIC HISTORY: Oncology History Overview Note   Cancer Staging  Cancer of right colon ([facility] Staging form: Colon and Rectum, AJCC 8th Edition - Pathologic stage from 04/22/2022: pT3, pN2b, cM1 - Signed by FTruitt Merle MD on 05/05/2022    Cancer of right colon ([facility]  04/12/2022 Miscellaneous   Fecal Occult Bld - POSITIVE   04/16/2022 Imaging   EXAM: CT CHEST, ABDOMEN, AND PELVIS WITH CONTRAST  IMPRESSION: 1. Infiltrative mass in the  ascending colon concerning for primary colonic neoplasm, estimated to measure approximately 6.5 x 4.7 x 7.4 cm, with evidence of local infiltration of disease in the pericolonic space adjacent to the lesion, as well as pathologically enlarged ileocolic and retroperitoneal lymphadenopathy, concerning for nodal metastasis. 2. Bilateral adrenal nodules which are indeterminate, but potentially metastatic. 3. Multiple hypovascular lesions scattered throughout the pancreas. The possibility of metastatic disease to the pancreas should be considered. Alternatively, benign etiologies such as chronic pancreatic pseudocysts could have a similar appearance (no recent prior studies are available for comparison). Close attention on follow-up studies is recommended to ensure the stability or regression of these pancreatic lesions. 4. Small to moderate left pleural effusion lying dependently with some passive subsegmental atelectasis in the left lower lobe. 5. Cardiomegaly with left atrial dilatation. 6. Aortic atherosclerosis, in addition to left main and three-vessel coronary artery disease. Please note that although the presence of coronary artery calcium documents the presence of coronary artery disease, the severity of this disease and any potential stenosis cannot be assessed on this non-gated CT examination. Assessment for potential risk factor modification, dietary therapy or pharmacologic therapy may be warranted, if clinically indicated. 7. There are calcifications of the aortic valve. Echocardiographic correlation for evaluation of potential valvular dysfunction may be warranted if clinically indicated. 8. Additional incidental findings, as above.   04/16/2022 Procedure   Colonoscopy; Dr. AHavery Moros Impression: -Stool in the entire examined colon, several minutes spent lavaging the colon - Likely malignant partially obstructing tumor in the suspected proximal transverse colon vs. right  colon as  above - could not traverse it. Biopsied. Tattooed. - Five 3 to 10 mm polyps in the transverse colon, removed with a cold snare. Resected and retrieved. - Two 6 to 8 mm polyps in the transverse colon, removed with a hot snare. Resected and retrieved. - One 6 mm polyp in the descending colon, removed with a cold snare. Resected and retrieved. - Two 3 to 4 mm polyps in the sigmoid colon, removed with a cold snare. Resected and retrieved. - One 15 mm polyp in the sigmoid colon, removed with a hot snare. Resected and retrieved. - One 3 to 4 mm polyp at the recto-sigmoid colon, removed with a cold snare. Resected and retrieved. - Internal hemorrhoids. - The examination was otherwise normal.  Colon mass is likely malignant and the cause of anemia / bleeding.   04/16/2022 Initial Biopsy   FINAL MICROSCOPIC DIAGNOSIS:   A. COLON MASS, BIOPSY:  - Moderately differentiated colonic adenocarcinoma.  - See comment.   B. COLON, TRANSVERSE, DESCENDING AND SIGMOID, POLYPECTOMY:  - Tubular adenoma (s) without high grade dysplasia.  - Sessile serrated adenoma(s) without cytologic dysplasia.   C. COLON, SIGMOID, POLYPECTOMY:  - Tubular adenoma without high grade dysplasia.    04/17/2022 Initial Diagnosis   Cancer of right colon ([place])   04/18/2022 Definitive Surgery   FINAL MICROSCOPIC DIAGNOSIS:   A. COLON, RIGHT, RESECTION:  -  Adenocarcinoma, moderate to poorly differentiated, 6.4 cm  -  Metastatic adenocarcinoma involving eleven of thirty-five lymph nodes (11/35)  -  Lymphovascular space invasion present  -  Margins uninvolved by carcinoma  -  Multiple tubular adenomas  -  Appendix uninvolved by adenocarcinoma  -  See oncology table and comment below   ADDENDUM:  Mismatch Repair Protein (IHC)  SUMMARY INTERPRETATION: NORMAL   04/22/2022 Cancer Staging   Staging form: Colon and Rectum, AJCC 8th Edition - Pathologic stage from 04/22/2022: pT3, pN2b, cM1 - Signed by [name], MD on 05/05/2022 Stage  prefix: Initial diagnosis Histologic grading system: 4 grade system Histologic grade (G): G3 Residual tumor (R): R0 - None      INTERVAL HISTORY:  Gregory [name] is here for a follow up of colon cancer He was last seen by me on 10/17/2022 He presents to the clinic accompanied by daughter. Pt reports swelling in the ankle. His breathing is good denies SOB, No nausea, but had some diarrhea. Denies having pain. Pt need help with some ADL'S. He reports he able to shower and get around with the walker. Pt said he recover well after hospital stay. Pt report BP is fine at home. Repeat BP.      All other systems were reviewed with the patient and are negative.  MEDICAL HISTORY:  Past Medical History:  Diagnosis Date   AICD (automatic cardioverter/defibrillator) present 2005   CAD (coronary artery disease) 12/01/2013   Cardiac arrest [facility])    Chronic combined systolic and diastolic CHF, NYHA class 1 ([place]) 12/01/2013   Erectile dysfunction 12/01/2013   HTN (hypertension) 12/01/2013   Hyperlipidemia 12/01/2013   Ischemic cardiomyopathy 12/01/2013   Presence of permanent cardiac pacemaker    Sleep apnea     SURGICAL HISTORY: Past Surgical History:  Procedure Laterality Date   ACHILLES TENDON REPAIR     lft foot   BIOPSY  04/16/2022   Procedure: BIOPSY;  Surgeon: Yetta Flock, MD;  Location: [place];  Service: Gastroenterology;;   CARDIAC CATHETERIZATION N/A 05/05/2016   Procedure: Left Heart Cath  and Coronary Angiography;  Surgeon: Leonie Man, MD;  Location: [place] CV LAB;  Service: Cardiovascular;  Laterality: N/A;   CARDIAC DEFIBRILLATOR PLACEMENT     CARDIOVERSION N/A 09/13/2020   Procedure: CARDIOVERSION;  Surgeon: Werner Lean, MD;  Location: [place] ENDOSCOPY;  Service: Cardiovascular;  Laterality: N/A;   CARDIOVERSION N/A 09/19/2020   Procedure: CARDIOVERSION;  Surgeon: [name], MD;  Location: [facility] ENDOSCOPY;  Service: Cardiovascular;   Laterality: N/A;   COLON RESECTION N/A 04/18/2022   Procedure: HAND ASSISTED LAPAROSCOPIC RIGHT COLON RESECTION;  Surgeon: Felicie Morn, MD;  Location: [place];  Service: General;  Laterality: N/A;   COLONOSCOPY WITH PROPOFOL N/A 04/16/2022   Procedure: COLONOSCOPY WITH PROPOFOL;  Surgeon: Yetta Flock, MD;  Location: [place];  Service: Gastroenterology;  Laterality: N/A;   EP IMPLANTABLE DEVICE N/A 12/19/2016   Procedure: ICD Generator Changeout;  Surgeon: [name], MD;  Location: [place] CV LAB;  Service: Cardiovascular;  Laterality: N/A;   ESOPHAGOGASTRODUODENOSCOPY N/A 04/16/2022   Procedure: ESOPHAGOGASTRODUODENOSCOPY (EGD);  Surgeon: Yetta Flock, MD;  Location[facility] ENDOSCOPY;  Service: Gastroenterology;  Laterality: N/A;   IR ANGIOGRAM SELECTIVE EACH ADDITIONAL VESSEL  05/09/2022   IR ANGIOGRAM VISCERAL SELECTIVE  05/09/2022   IR CATHETER TUBE CHANGE  05/14/2022   IR CATHETER TUBE CHANGE  06/27/2022   IR EMBO ART  VEN HEMORR LYMPH EXTRAV  INC GUIDE ROADMAPPING  05/09/2022   IR IMAGE GUIDED DRAINAGE PERCUT CATH  PERITONEAL RETROPERIT  05/09/2022   IR PATIENT EVAL TECH 0-60 MINS  06/11/2022   IR RADIOLOGIST EVAL & MGMT  06/26/2022   IR RADIOLOGIST EVAL & MGMT  07/16/2022   IR THORACENTESIS ASP PLEURAL SPACE W/IMG GUIDE  09/23/2022   IR US GUIDE VASC ACCESS RIGHT  05/09/2022   LEFT HEART CATH AND CORONARY ANGIOGRAPHY N/A 04/17/2022   Procedure: LEFT HEART CATH AND CORONARY ANGIOGRAPHY;  Surgeon: Belva Crome, MD;  Location: [place] CV LAB;  Service: Cardiovascular;  Laterality: N/A;   POLYPECTOMY  04/16/2022   Procedure: POLYPECTOMY;  Surgeon: Yetta Flock, MD;  Location: [facility] ENDOSCOPY;  Service: Gastroenterology;;   RIGHT/LEFT HEART CATH AND CORONARY ANGIOGRAPHY N/A 09/24/2020   Procedure: RIGHT/LEFT HEART CATH AND CORONARY ANGIOGRAPHY;  Surgeon: Lorretta Harp, MD;  Location: [place] CV LAB;  Service: Cardiovascular;  Laterality: N/A;   SUBMUCOSAL  TATTOO INJECTION  04/16/2022   Procedure: SUBMUCOSAL TATTOO INJECTION;  Surgeon: Yetta Flock, MD;  Location: [facility] ENDOSCOPY;  Service: Gastroenterology;;   WRIST TENODESIS      I have reviewed the social history and family history with the patient and they are unchanged from previous note.  ALLERGIES:  has No Known Allergies.  MEDICATIONS:  Current Outpatient Medications  Medication Sig Dispense Refill   acetaminophen (TYLENOL) 325 MG tablet Take 1-2 tablets (325-650 mg total) by mouth every 4 (four) hours as needed for mild pain.     ALPRAZolam (XANAX) 0.25 MG tablet Take 1 tablet (0.25 mg total) by mouth at bedtime as needed for anxiety or sleep. 30 tablet 0   amiodarone (PACERONE) 200 MG tablet Take 2 tablets (400 mg total) by mouth daily. 180 tablet 1   apixaban (ELIQUIS) 5 MG TABS tablet Take 1 tablet (5 mg total) by mouth 2 (two) times daily. 180 tablet 1   atorvastatin (LIPITOR) 80 MG tablet Take 1 tablet (80 mg total) by mouth every evening. Please schedule appointment for additional refills. 90 tablet 1   capecitabine (  XELODA) 500 MG tablet Take 4 tabs every 12 hours for 14 days then off for 7 days, take after a meal 112 tablet 1   clopidogrel (PLAVIX) 75 MG tablet Take 1 tablet (75 mg total) by mouth daily. Please schedule appointment for additional refills. 90 tablet 1   dapagliflozin propanediol (FARXIGA) 10 MG TABS tablet Take 1 tablet (10 mg total) by mouth daily. Please contact the office to schedule appointment for additional refills. 90 tablet 1   diclofenac Sodium (VOLTAREN) 1 % GEL Apply 2 g topically 4 (four) times daily. (Patient taking differently: Apply 2 g topically daily as needed (For pain).)     ezetimibe (ZETIA) 10 MG tablet Take 1 tablet (10 mg total) by mouth daily. Please contact the office to schedule appointment for additional refills. 90 tablet 1   furosemide (LASIX) 40 MG tablet Take 1 tablet (40 mg total) by mouth daily. 90 tablet 1   levETIRAcetam  (KEPPRA) 750 MG tablet Take 2 tablets twice a day 360 tablet 3   levothyroxine (SYNTHROID) 50 MCG tablet Take 1 tablet (50 mcg total) by mouth daily at 6 (six) AM. 30 tablet 0   lidocaine (LMX) 4 % cream Apply topically 3 (three) times daily as needed (Apply to nostril for NG tube irritation). 30 g 0   metoprolol succinate (TOPROL-XL) 50 MG 24 hr tablet Take 1 tablet (50 mg total) by mouth 2 (two) times daily. Take with or immediately following a meal. Please schedule appointment for additional refills. 180 tablet 1   naloxone (NARCAN) nasal spray 4 mg/0.1 mL Use as needed in case of overdose 2 each 0   pantoprazole (PROTONIX) 40 MG tablet Take 1 tablet (40 mg total) by mouth daily. (Patient not taking: Reported on 10/02/2022) 30 tablet 0   polyethylene glycol (MIRALAX / GLYCOLAX) 17 g packet Take 17 g by mouth daily as needed for mild constipation.     potassium chloride (KLOR-CON M) 10 MEQ tablet Take 1 tablet (10 mEq total) by mouth daily. 90 tablet 1   sacubitril-valsartan (ENTRESTO) 49-51 MG Take 1 tablet by mouth 2 (two) times daily. Please contact the office to schedule appointment for additional refills. 180 tablet 1   traZODone (DESYREL) 50 MG tablet Take 1 tablet (50 mg total) by mouth at bedtime. 90 tablet 3   No current facility-administered medications for this visit.    PHYSICAL EXAMINATION: ECOG PERFORMANCE STATUS: 2 - Symptomatic, <50% confined to bed  Vitals:   11/14/22 1329 11/14/22 1345  BP: (!) 159/111 132/74  Pulse: 78   Resp: 16   Temp: 98.6 F (37 C)   SpO2: 99%    Wt Readings from Last 3 Encounters:  11/14/22 222 lb 3.2 oz (100.8 kg)  10/17/22 205 lb 9.6 oz (93.3 kg)  10/02/22 203 lb 3.2 oz (92.2 kg)    GENERAL:alert, no distress and comfortable SKIN: skin color, texture, turgor are normal, acne rash or significant lesions LUNGS: clear to auscultation, fluid is still in left lung,and percussion with normal breathing effort HEART: regular rate & rhythm and no  murmurs and no lower extremity edema ABDOMEN:abdomen soft, non-tender and normal bowel sounds Musculoskeletal:no cyanosis of digits and no clubbing  NEURO: alert & oriented x 3 with fluent speech, no focal motor/sensory deficits  LABORATORY DATA:  I have reviewed the data as listed    Latest Ref Rng & Units 11/14/2022   12:58 PM 10/17/2022    2:17 PM 09/26/2022    6:00 AM  CBC  WBC 4.0 - 10.5 K/uL 6.7  8.8  5.9   Hemoglobin 13.0 - 17.0 g/dL 13.8  15.1  12.3   Hematocrit 39.0 - 52.0 % 39.9  45.4  36.7   Platelets 150 - 400 K/uL 185  218  231         Latest Ref Rng & Units 10/17/2022    2:17 PM 09/26/2022    6:00 AM 09/25/2022    6:11 AM  CMP  Glucose 70 - 99 mg/dL 97  136  160   BUN 8 - 23 mg/dL _0 Creatinine 0.61 - 1.24 mg/dL 1.09  0.74  0.92   Sodium 135 - 145 mmol/L 139  136  136   Potassium 3.5 - 5.1 mmol/L 4.4  4.1  4.3   Chloride 98 - 111 mmol/L 100  107  104   CO2 22 - 32 mmol/L 32  24  22   Calcium 8.9 - 10.3 mg/dL 9.3  7.8  7.9   Total Protein 6.5 - 8.1 g/dL 8.2  6.2  6.4   Total Bilirubin 0.3 - 1.2 mg/dL 0.7  0.5  0.5   Alkaline Phos 38 - 126 U/L 159  142  136   AST 15 - 41 U/L 24  66  148   ALT 0 - 44 U/L 30  103  147       RADIOGRAPHIC STUDIES: I have personally reviewed the radiological images as listed and agreed with the findings in the report. No results found.    No orders of the defined types were placed in this encounter.  All questions were answered. The patient knows to call the clinic with any problems, questions or concerns. No barriers to learning was detected. The total time spent in the appointment was 30 minutes.     [name], MD 11/14/2022   Felicity Coyer, CMA, am acting as scribe for [name], MD.   I have reviewed the above documentation for accuracy and completeness, and I agree with the above.

## 2022-11-14 ENCOUNTER — Encounter: Payer: Self-pay | Admitting: Hematology

## 2022-11-14 ENCOUNTER — Inpatient Hospital Stay (HOSPITAL_BASED_OUTPATIENT_CLINIC_OR_DEPARTMENT_OTHER): Payer: Commercial Managed Care - HMO | Admitting: Hematology

## 2022-11-14 ENCOUNTER — Inpatient Hospital Stay: Payer: Commercial Managed Care - HMO | Attending: Hematology

## 2022-11-14 VITALS — BP 132/74 | HR 78 | Temp 98.6°F | Resp 16 | Wt 222.2 lb

## 2022-11-14 DIAGNOSIS — Z8674 Personal history of sudden cardiac arrest: Secondary | ICD-10-CM | POA: Insufficient documentation

## 2022-11-14 DIAGNOSIS — I700 Atherosclerosis of aorta: Secondary | ICD-10-CM | POA: Diagnosis not present

## 2022-11-14 DIAGNOSIS — R197 Diarrhea, unspecified: Secondary | ICD-10-CM | POA: Diagnosis not present

## 2022-11-14 DIAGNOSIS — G4730 Sleep apnea, unspecified: Secondary | ICD-10-CM | POA: Insufficient documentation

## 2022-11-14 DIAGNOSIS — I5042 Chronic combined systolic (congestive) and diastolic (congestive) heart failure: Secondary | ICD-10-CM

## 2022-11-14 DIAGNOSIS — D124 Benign neoplasm of descending colon: Secondary | ICD-10-CM | POA: Insufficient documentation

## 2022-11-14 DIAGNOSIS — I2510 Atherosclerotic heart disease of native coronary artery without angina pectoris: Secondary | ICD-10-CM | POA: Diagnosis not present

## 2022-11-14 DIAGNOSIS — Z7902 Long term (current) use of antithrombotics/antiplatelets: Secondary | ICD-10-CM | POA: Diagnosis not present

## 2022-11-14 DIAGNOSIS — K648 Other hemorrhoids: Secondary | ICD-10-CM | POA: Diagnosis not present

## 2022-11-14 DIAGNOSIS — K651 Peritoneal abscess: Secondary | ICD-10-CM | POA: Insufficient documentation

## 2022-11-14 DIAGNOSIS — Z7901 Long term (current) use of anticoagulants: Secondary | ICD-10-CM | POA: Insufficient documentation

## 2022-11-14 DIAGNOSIS — I110 Hypertensive heart disease with heart failure: Secondary | ICD-10-CM | POA: Diagnosis not present

## 2022-11-14 DIAGNOSIS — D123 Benign neoplasm of transverse colon: Secondary | ICD-10-CM | POA: Insufficient documentation

## 2022-11-14 DIAGNOSIS — D125 Benign neoplasm of sigmoid colon: Secondary | ICD-10-CM | POA: Insufficient documentation

## 2022-11-14 DIAGNOSIS — Z95810 Presence of automatic (implantable) cardiac defibrillator: Secondary | ICD-10-CM | POA: Insufficient documentation

## 2022-11-14 DIAGNOSIS — Z79899 Other long term (current) drug therapy: Secondary | ICD-10-CM | POA: Diagnosis not present

## 2022-11-14 DIAGNOSIS — C182 Malignant neoplasm of ascending colon: Secondary | ICD-10-CM | POA: Diagnosis present

## 2022-11-14 DIAGNOSIS — D649 Anemia, unspecified: Secondary | ICD-10-CM | POA: Insufficient documentation

## 2022-11-14 DIAGNOSIS — C787 Secondary malignant neoplasm of liver and intrahepatic bile duct: Secondary | ICD-10-CM | POA: Diagnosis not present

## 2022-11-14 DIAGNOSIS — D509 Iron deficiency anemia, unspecified: Secondary | ICD-10-CM

## 2022-11-14 DIAGNOSIS — I255 Ischemic cardiomyopathy: Secondary | ICD-10-CM | POA: Insufficient documentation

## 2022-11-14 LAB — COMPREHENSIVE METABOLIC PANEL
ALT: 15 U/L (ref 0–44)
AST: 19 U/L (ref 15–41)
Albumin: 4.2 g/dL (ref 3.5–5.0)
Alkaline Phosphatase: 88 U/L (ref 38–126)
Anion gap: 8 (ref 5–15)
BUN: 16 mg/dL (ref 8–23)
CO2: 28 mmol/L (ref 22–32)
Calcium: 9.4 mg/dL (ref 8.9–10.3)
Chloride: 103 mmol/L (ref 98–111)
Creatinine, Ser: 1.11 mg/dL (ref 0.61–1.24)
GFR, Estimated: >60 mL/min (ref >60)
Glucose, Bld: 87 mg/dL (ref 70–99)
Potassium: 4.1 mmol/L (ref 3.5–5.1)
Sodium: 139 mmol/L (ref 135–145)
Total Bilirubin: 0.9 mg/dL (ref 0.3–1.2)
Total Protein: 7.5 g/dL (ref 6.5–8.1)

## 2022-11-14 LAB — CBC WITH DIFFERENTIAL/PLATELET
Abs Immature Granulocytes: 0.02 10*3/uL (ref 0.00–0.07)
Basophils Absolute: 0 10*3/uL (ref 0.0–0.1)
Basophils Relative: 0 %
Eosinophils Absolute: 0.3 10*3/uL (ref 0.0–0.5)
Eosinophils Relative: 5 %
HCT: 39.9 % (ref 39.0–52.0)
Hemoglobin: 13.8 g/dL (ref 13.0–17.0)
Immature Granulocytes: 0 %
Lymphocytes Relative: 21 %
Lymphs Abs: 1.4 10*3/uL (ref 0.7–4.0)
MCH: 34.5 pg — ABNORMAL HIGH (ref 26.0–34.0)
MCHC: 34.6 g/dL (ref 30.0–36.0)
MCV: 99.8 fL (ref 80.0–100.0)
Monocytes Absolute: 0.5 10*3/uL (ref 0.1–1.0)
Monocytes Relative: 7 %
Neutro Abs: 4.5 10*3/uL (ref 1.7–7.7)
Neutrophils Relative %: 67 %
Platelets: 185 10*3/uL (ref 150–400)
RBC: 4 MIL/uL — ABNORMAL LOW (ref 4.22–5.81)
RDW: 21.7 % — ABNORMAL HIGH (ref 11.5–15.5)
WBC: 6.7 10*3/uL (ref 4.0–10.5)
nRBC: 0.3 % — ABNORMAL HIGH (ref 0.0–0.2)

## 2022-11-14 LAB — CEA (IN HOUSE-CHCC): CEA (CHCC-In House): 4.67 ng/mL (ref 0.00–5.00)

## 2022-11-14 LAB — FERRITIN: Ferritin: 41 ng/mL (ref 24–336)

## 2022-11-14 MED ORDER — ALPRAZOLAM 0.25 MG PO TABS: 0.2500 mg | ORAL_TABLET | Freq: Every evening | ORAL | 0 refills | Status: DC | PRN

## 2022-11-14 NOTE — Assessment & Plan Note (Signed)
-  Due to CAD, HTN, s/p AICD  -EF 35-40% F/U with cardiology

## 2022-11-14 NOTE — Assessment & Plan Note (Signed)
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 S/p resection on 04/18/22 by Dr. Thermon Leyland -postoperative course complicated by intra-abdominal abscess, which required drain placement. -he started adjuvant Xeloda on 07/21/22, had multiple interruption due to medical illness, continued due to metastatic disease now

## 2022-11-17 ENCOUNTER — Telehealth: Payer: Self-pay | Admitting: Physician Assistant

## 2022-11-17 NOTE — Telephone Encounter (Signed)
Called patient to notify of upcoming appointments. Left voicemail with appointment information.

## 2022-11-18 ENCOUNTER — Other Ambulatory Visit: Payer: Self-pay | Admitting: Hematology

## 2022-11-20 NOTE — Telephone Encounter (Signed)
Per Dr Burr Medico, pt will need to contact his PCP or Endocrinologist to refill his Synthroid medicine for his thyroid.

## 2022-11-21 ENCOUNTER — Other Ambulatory Visit: Payer: Self-pay | Admitting: Hematology

## 2022-11-21 ENCOUNTER — Other Ambulatory Visit: Payer: Self-pay | Admitting: Nurse Practitioner

## 2022-11-21 MED ORDER — LEVOTHYROXINE SODIUM 50 MCG PO TABS: 50.0000 ug | ORAL_TABLET | Freq: Every day | ORAL | 0 refills | Status: DC

## 2022-11-24 ENCOUNTER — Other Ambulatory Visit (HOSPITAL_COMMUNITY): Payer: Self-pay

## 2022-11-24 ENCOUNTER — Encounter: Payer: Self-pay | Admitting: Hematology

## 2022-11-24 NOTE — Telephone Encounter (Signed)
Pt is to f/u with PCP or Endcrinologist regarding Thyroid meds.

## 2022-12-03 ENCOUNTER — Ambulatory Visit: Payer: PRIVATE HEALTH INSURANCE | Attending: Internal Medicine | Admitting: Internal Medicine

## 2022-12-03 ENCOUNTER — Encounter: Payer: Self-pay | Admitting: Internal Medicine

## 2022-12-03 VITALS — BP 122/80 | HR 53 | Ht 78.0 in | Wt 214.6 lb

## 2022-12-03 DIAGNOSIS — I472 Ventricular tachycardia, unspecified: Secondary | ICD-10-CM

## 2022-12-03 DIAGNOSIS — Z95810 Presence of automatic (implantable) cardiac defibrillator: Secondary | ICD-10-CM

## 2022-12-03 DIAGNOSIS — I471 Supraventricular tachycardia, unspecified: Secondary | ICD-10-CM

## 2022-12-03 DIAGNOSIS — I5022 Chronic systolic (congestive) heart failure: Secondary | ICD-10-CM

## 2022-12-03 DIAGNOSIS — I255 Ischemic cardiomyopathy: Secondary | ICD-10-CM

## 2022-12-03 MED ORDER — LEVOTHYROXINE SODIUM 50 MCG PO TABS: 50.0000 ug | ORAL_TABLET | Freq: Every day | ORAL | 0 refills | Status: AC

## 2022-12-03 NOTE — Progress Notes (Signed)
Patient Care Team: Patient, No Pcp Per as PCP - General (General Practice) Croitoru, [name], MD as PCP - Cardiology (Cardiology) [name], MD as PCP - Electrophysiology (Cardiology) [name], MD as Consulting Physician (Neurology) [name], MD as Consulting Physician (Hematology) Stechschulte, Nickola Major, MD as Consulting Physician (Surgery)   HPI  [facility] Gregory [name] is a 61 y.o. male Seen in followup for ventricular tachycardia associated with a previously implanted ICD in the context of congestive heart failure ischemic cardiomyopathy. Ejection fraction echo 2014--45-50% and Myoview 2010 extensive inferolateral scar. He had a type I have talked with  underwent ICD generator replacement 1/18 10/21 developed atrial fibrillation anticoagulated with  Eliquis  He presented 5/17 and  found to be in sustained monomorphic ventricular tachycardia rate of 160 below his detection rate. His device was reprogrammed to 150.  Has been treated with amiodarone.  Recurrent VT prompted a discussion with Dr. Elliot Cousin 4/23 for VT ablation, it was elected to defer;  high-dose amiodarone was resumed  Profound anemia prompted further evaluation demonstrating a colon cancer poorly differentiated, positive lymph nodes positive PET scan for hepatic lesions and upper abdominal lymph nodes  Reepeated episodes of VT most recently 3/23; also 9/22 and 1/22-  Interval hospitalization 9/23 for PEA arrest in the context of aspiration associated with a seizure and another hospitalization secondary to [place].  Imaging also demonstrated presumed metastatic lesions in the liver and adrenal gland.  Thoracentesis demonstrated only reactive mesothelial cells  History of metastatic colon cancer  Walkingwith walker but not necessary Not clear on the status of his Cancer ( notes reviewed )  DATE TEST EF   3/14 Echo   45-50 % LVH mod/ BAE  5/17 LHC   LADm-stent-p;D1-stent-p; CxOM1-stent-p;RCAp-T, collaterals   5/17 Echo  60-65%   6/19 Echo  60-65% LAE (56/2.1/45)  10/21 Echo  40-45%   10/21 LHC  Patent stents      He's been managed by combination of amiodarone and mexiletine, the latter stopped 2/2 flatus amiodarone was just uptitrated in light of the episodes 3/23.   Date TSH LFTs Cr K Hgb  5/17       3.3   9/17  2.24 21  5.1   7/19 1.408 16 1.08 4.0 11.4<<8.4  1/21 0.643  0.87 4.2 14.9  7/22        Has a history of seizures ventricular tachycardia Antiarrhythmics Date Reason stopped  amiodarone   Ele Alk phos resumed  mexilitene Stopped   flatus            Past Medical History:  Diagnosis Date   AICD (automatic cardioverter/defibrillator) present 2005   CAD (coronary artery disease) 12/01/2013   Cardiac arrest ([place])    Chronic combined systolic and diastolic CHF, NYHA class 1 ([place]) 12/01/2013   Erectile dysfunction 12/01/2013   HTN (hypertension) 12/01/2013   Hyperlipidemia 12/01/2013   Ischemic cardiomyopathy 12/01/2013   Presence of permanent cardiac pacemaker    Sleep apnea     Past Surgical History:  Procedure Laterality Date   ACHILLES TENDON REPAIR     lft foot   BIOPSY  04/16/2022   Procedure: BIOPSY;  Surgeon: Yetta Flock, MD;  Location: [place];  Service: Gastroenterology;;   CARDIAC CATHETERIZATION N/A 05/05/2016   Procedure: Left Heart Cath and Coronary Angiography;  Surgeon: Leonie Man, MD;  Location: [place] [facility];  Service: Cardiovascular;  Laterality: N/A;   CARDIAC DEFIBRILLATOR PLACEMENT  CARDIOVERSION N/A 09/13/2020   Procedure: CARDIOVERSION;  Surgeon: Werner Lean, MD;  Location: [place] [facility];  Service: Cardiovascular;  Laterality: N/A;   CARDIOVERSION N/A 09/19/2020   Procedure: CARDIOVERSION;  Surgeon: [name], MD;  Location[facility] ENDOSCOPY;  Service: Cardiovascular;  Laterality: N/A;   COLON RESECTION N/A 04/18/2022   Procedure: HAND ASSISTED LAPAROSCOPIC RIGHT COLON RESECTION;  Surgeon: Felicie Morn, MD;  Location: [place];  Service: General;  Laterality: N/A;   COLONOSCOPY WITH PROPOFOL N/A 04/16/2022   Procedure: COLONOSCOPY WITH PROPOFOL;  Surgeon: Yetta Flock, MD;  Location: [name];  Service: Gastroenterology;  Laterality: N/A;   EP IMPLANTABLE DEVICE N/A 12/19/2016   Procedure: ICD Generator Changeout;  Surgeon: [name], MD;  Location: [place] [facility];  Service: Cardiovascular;  Laterality: N/A;   ESOPHAGOGASTRODUODENOSCOPY N/A 04/16/2022   Procedure: ESOPHAGOGASTRODUODENOSCOPY (EGD);  Surgeon: Yetta Flock, MD;  [facility] ENDOSCOPY;  Service: Gastroenterology;  Laterality: N/A;   IR ANGIOGRAM SELECTIVE EACH ADDITIONAL VESSEL  05/09/2022   IR ANGIOGRAM VISCERAL SELECTIVE  05/09/2022   IR CATHETER TUBE CHANGE  05/14/2022   IR CATHETER TUBE CHANGE  06/27/2022   IR EMBO ART  VEN HEMORR LYMPH EXTRAV  INC GUIDE ROADMAPPING  05/09/2022   IR IMAGE GUIDED DRAINAGE PERCUT CATH  PERITONEAL RETROPERIT  05/09/2022   IR PATIENT EVAL TECH 0-60 MINS  06/11/2022   IR RADIOLOGIST EVAL & MGMT  06/26/2022   IR RADIOLOGIST EVAL & MGMT  07/16/2022   IR THORACENTESIS ASP PLEURAL SPACE W/IMG GUIDE  09/23/2022   IR US GUIDE VASC ACCESS RIGHT  05/09/2022   LEFT HEART CATH AND CORONARY ANGIOGRAPHY N/A 04/17/2022   Procedure: LEFT HEART CATH AND CORONARY ANGIOGRAPHY;  Surgeon: Belva Crome, MD;  Location: [place] [facility];  Service: Cardiovascular;  Laterality: N/A;   POLYPECTOMY  04/16/2022   Procedure: POLYPECTOMY;  Surgeon: Yetta Flock, MD;  Location[facility] ENDOSCOPY;  Service: Gastroenterology;;   RIGHT/LEFT HEART CATH AND CORONARY ANGIOGRAPHY N/A 09/24/2020   Procedure: RIGHT/LEFT HEART CATH AND CORONARY ANGIOGRAPHY;  Surgeon: Lorretta Harp, MD;  Location: [place] [facility];  Service: Cardiovascular;  Laterality: N/A;   SUBMUCOSAL TATTOO INJECTION  04/16/2022   Procedure: SUBMUCOSAL TATTOO INJECTION;  Surgeon: Yetta Flock, MD;  Location: [facility] ENDOSCOPY;  Service:  Gastroenterology;;   WRIST TENODESIS      Current Outpatient Medications  Medication Sig Dispense Refill   acetaminophen (TYLENOL) 325 MG tablet Take 1-2 tablets (325-650 mg total) by mouth every 4 (four) hours as needed for mild pain.     ALPRAZolam (XANAX) 0.25 MG tablet Take 1 tablet (0.25 mg total) by mouth at bedtime as needed for anxiety or sleep. 30 tablet 0   amiodarone (PACERONE) 200 MG tablet Take 2 tablets (400 mg total) by mouth daily. 180 tablet 1   apixaban (ELIQUIS) 5 MG TABS tablet Take 1 tablet (5 mg total) by mouth 2 (two) times daily. 180 tablet 1   atorvastatin (LIPITOR) 80 MG tablet Take 1 tablet (80 mg total) by mouth every evening. Please schedule appointment for additional refills. 90 tablet 1   calcium carbonate (TUMS - DOSED IN MG ELEMENTAL CALCIUM) 500 MG chewable tablet Chew 1 tablet by mouth daily.     capecitabine (XELODA) 500 MG tablet Take 4 tabs every 12 hours for 14 days then off for 7 days, take after a meal 112 tablet 1   clopidogrel (PLAVIX) 75 MG tablet Take 1 tablet (75  mg total) by mouth daily. Please schedule appointment for additional refills. 90 tablet 1   dapagliflozin propanediol (FARXIGA) 10 MG TABS tablet Take 1 tablet (10 mg total) by mouth daily. Please contact the office to schedule appointment for additional refills. 90 tablet 1   diclofenac Sodium (VOLTAREN) 1 % GEL Apply 2 g topically 4 (four) times daily. (Patient taking differently: Apply 2 g topically daily as needed (For pain).)     ezetimibe (ZETIA) 10 MG tablet Take 1 tablet (10 mg total) by mouth daily. Please contact the office to schedule appointment for additional refills. 90 tablet 1   furosemide (LASIX) 40 MG tablet Take 1 tablet (40 mg total) by mouth daily. 90 tablet 1   levETIRAcetam (KEPPRA) 750 MG tablet Take 2 tablets twice a day 360 tablet 3   metoprolol succinate (TOPROL-XL) 50 MG 24 hr tablet Take 1 tablet (50 mg total) by mouth 2 (two) times daily. Take with or immediately  following a meal. Please schedule appointment for additional refills. 180 tablet 1   naloxone (NARCAN) nasal spray 4 mg/0.1 mL Use as needed in case of overdose 2 each 0   polyethylene glycol (MIRALAX / GLYCOLAX) 17 g packet Take 17 g by mouth daily as needed for mild constipation.     potassium chloride (KLOR-CON M) 10 MEQ tablet Take 1 tablet (10 mEq total) by mouth daily. 90 tablet 1   sacubitril-valsartan (ENTRESTO) 49-51 MG Take 1 tablet by mouth 2 (two) times daily. Please contact the office to schedule appointment for additional refills. 180 tablet 1   traZODone (DESYREL) 50 MG tablet Take 1 tablet (50 mg total) by mouth at bedtime. 90 tablet 3   levothyroxine (SYNTHROID) 50 MCG tablet Take 1 tablet (50 mcg total) by mouth daily at 6 (six) AM. 90 tablet 0   No current facility-administered medications for this visit.      No Known Allergies  Review of Systems negative except from HPI and PMH  Physical Exam BP 122/80   Pulse (!) 53   Ht _0  (1.981 m)   Wt 214 lb 9.6 oz (97.3 kg)   SpO2 94%   BMI 24.80 kg/m  Well developed and well nourished in no acute distress HENT normal Neck supple with JVP-flat Clear Device pocket well healed; without hematoma or erythema.  There is no tethering  Regular rate and rhythm, no  murmur Abd-soft with active BS No Clubbing cyanosis  edema [place]ayya puffiness Skin-warm and dry A & Oriented  Grossly normal sensory and motor function  ECG atrial pacing at 68 Intervals 19/12/45 Inferior wall MI    Assessment and plan PVCs infrequent  Ventricular tachycardia recurrent treated with ATP  ICD  medtronic    Ischemic Cardiomyopathy    CHF chronic systolic    Seizure disorder  Supraventricular tachycardia-nonsustained (atrial tachycardia)  Psychosocial stress   Amiodarone o   Interval PEA arrest secondary to aspiration in the context of a seizure.  Recurrent ventricular tachycardia quiescient on amiodarone 400 mg a day.  Will  continue for now and then drop it down to 300 mg a day a few months  No bleeding on the Eliquis.  Continue at 5 mg twice a day.  Continue Entresto for his cardiomyopathy as well as metoprolol 50 twice daily

## 2022-12-03 NOTE — Patient Instructions (Addendum)
Medication Instructions:  Your physician recommends that you continue on your current medications as directed. Please refer to the Current Medication list given to you today.  *If you need a refill on your cardiac medications before your next appointment, please call your pharmacy*   Lab Work: None ordered.  If you have labs (blood work) drawn today and your tests are completely normal, you will receive your results only by: [place] (if you have MyChart) OR A paper copy in the mail If you have any lab test that is abnormal or we need to change your treatment, we will call you to review the results.   Testing/Procedures: None ordered.    Follow-Up: At [facility], you and your health needs are our priority.  As part of our continuing mission to provide you with exceptional heart care, we have created designated Provider Care Teams.  These Care Teams include your primary Cardiologist (physician) and Advanced Practice Providers (APPs -  Physician Assistants and Nurse Practitioners) who all work together to provide you with the care you need, when you need it.  We recommend signing up for the patient portal called "MyChart".  Sign up information is provided on this After Visit Summary.  MyChart is used to connect with patients for Virtual Visits (Telemedicine).  Patients are able to view lab/test results, encounter notes, upcoming appointments, etc.  Non-urgent messages can be sent to your provider as well.   To learn more about what you can do with MyChart, go to [email or web address].    Your next appointment:   3 months with Dr Caryl Comes  Important Information About Sugar

## 2022-12-12 NOTE — Progress Notes (Signed)
Remote ICD transmission.   

## 2022-12-18 ENCOUNTER — Other Ambulatory Visit (HOSPITAL_COMMUNITY): Payer: Self-pay

## 2022-12-18 ENCOUNTER — Other Ambulatory Visit: Payer: Self-pay

## 2022-12-18 MED ORDER — AMIODARONE HCL 200 MG PO TABS: 400.0000 mg | ORAL_TABLET | Freq: Every day | ORAL | 0 refills | Status: AC

## 2022-12-21 NOTE — Progress Notes (Unsigned)
[place] OFFICE PROGRESS NOTE  Patient, No Pcp Per No address on file  DIAGNOSIS:  f/u of colon cancer   Oncology History Overview Note   Cancer Staging  Cancer of right colon [name]) Staging form: Colon and Rectum, AJCC 8th Edition - Pathologic stage from 04/22/2022: pT3, pN2b, cM1 - Signed by [name], MD on 05/05/2022    Cancer of right colon ([place])  04/12/2022 Miscellaneous   Fecal Occult Bld - POSITIVE   04/16/2022 Imaging   EXAM: CT CHEST, ABDOMEN, AND PELVIS WITH CONTRAST  IMPRESSION: 1. 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 2. Bilateral adrenal nodules which are indeterminate, but potentially metastatic. 3. Multiple hypovascular lesions scattered throughout the pancreas. The possibility of metastatic disease to the pancreas should be considered. Alternatively, benign etiologies such as chronic pancreatic pseudocysts could have a similar appearance (no recent prior studies are available for comparison). Close attention on follow-up studies is recommended to ensure the stability or regression of these pancreatic lesions. 4. Small to moderate left pleural effusion lying dependently with some passive subsegmental atelectasis in the left lower lobe. 5. Cardiomegaly with left atrial dilatation. 6. Aortic atherosclerosis, in addition to left main and three-vessel coronary artery disease. Please note that although the presence of coronary artery calcium documents the presence of coronary artery disease, the severity of this disease and any potential stenosis cannot be assessed on this non-gated CT examination. Assessment for potential risk factor modification,  dietary therapy or pharmacologic therapy may be warranted, if clinically indicated. 7. There are calcifications of the aortic valve. Echocardiographic correlation for evaluation of potential valvular dysfunction may be warranted if clinically indicated. 8. Additional incidental findings, as above.   04/16/2022 Procedure   Colonoscopy; Dr. Havery Moros  Impression: -Stool in the entire examined colon, several minutes spent lavaging the colon - Likely malignant partially obstructing tumor in the suspected proximal transverse colon vs. right colon as above - could not traverse it. Biopsied. Tattooed. - Five 3 to 10 mm polyps in the transverse colon, removed with a cold snare. Resected and retrieved. - Two 6 to 8 mm polyps in the transverse colon, removed with a hot snare. Resected and retrieved. - One 6 mm polyp in the descending colon, removed with a cold snare. Resected and retrieved. - Two 3 to 4 mm polyps in the sigmoid colon, removed with a cold snare. Resected and retrieved. - One 15 mm polyp in the sigmoid colon, removed with a hot snare. Resected and retrieved. - One 3 to 4 mm polyp at the recto-sigmoid colon, removed with a cold snare. Resected and retrieved. - Internal hemorrhoids. - The examination was otherwise normal.  Colon mass is likely malignant and the cause of anemia / bleeding.   04/16/2022 Initial Biopsy   FINAL MICROSCOPIC DIAGNOSIS:   A. COLON MASS, BIOPSY:  - Moderately differentiated colonic adenocarcinoma.  - See comment.   B. COLON, TRANSVERSE, DESCENDING AND SIGMOID, POLYPECTOMY:  - Tubular adenoma (s) without high grade dysplasia.  - Sessile serrated adenoma(s) without cytologic dysplasia.   C. COLON, SIGMOID, POLYPECTOMY:  - Tubular adenoma without high grade dysplasia.    04/17/2022 Initial Diagnosis   Cancer of right colon ([place])   04/18/2022 Definitive Surgery   FINAL MICROSCOPIC DIAGNOSIS:  A. COLON, RIGHT, RESECTION:  -  Adenocarcinoma, moderate to  poorly differentiated, 6.4 cm  -  Metastatic adenocarcinoma involving eleven of thirty-five lymph nodes (11/35)  -  Lymphovascular space invasion present  -  Margins uninvolved by carcinoma  -  Multiple tubular adenomas  -  Appendix uninvolved by adenocarcinoma  -  See oncology table and comment below   ADDENDUM:  Mismatch Repair Protein ([facility])  SUMMARY INTERPRETATION: NORMAL   04/22/2022 Cancer Staging   Staging form: Colon and Rectum, AJCC 8th Edition - Pathologic stage from 04/22/2022: pT3, pN2b, cM1 - Signed by [name], MD on 05/05/2022 Stage prefix: Initial diagnosis Histologic grading system: 4 grade system Histologic grade (G): G3 Residual tumor (R): R0 - None      CURRENT THERAPY: Xeloda, q21d, on 07/21/22             -dose: '2000mg'$  BID on days 1-14  INTERVAL HISTORY: Gregory [name] 62 y.o. male returns to the clinic today for a follow-up visit.  The patient was last seen 5 weeks ago by Dr. Burr Medico on 11/14/2022.  The patient is currently being treated with Xeloda days 1 through 14 every 3 weeks. He started day one of current cycle this morning. He tolerates this fairly well.  He is accompanied by his son today.  In the interval since last being seen he saw his cardiologist on 12/03/2022, Dr. Caryl Comes. He had repeat EKG which was reviewed by Dr. Burr Medico and is acceptable.   The patient denies any major changes in his health since last being seen. He denies any fever, chills, or night sweats. His energy is stable but sometimes he has fatigue. He reports a good appetite. He gained about 2 lbs. He does have mild swelling in his ankles bilaterally. He notes it waxes and wanes. He mentions he has been eating a lot of soup recently. He denies any nausea, vomiting, or constipation.  He mentions he had a bout of diarrhea twice since last being seen but attributes it to something he ate. He denies abdominal pain. He reports a rash on his chest that started after using an electric blanket. The  rash is not pruritic. He thinks it is improving. He has some dry skin on his hands but denies erythema, peeling, or pain. Denies blood in the stool. He denies any shortness of breath, cough, or hemoptysis. Denies stomatitis. He uses tylenol daily for hip pain. He is here today for evaluation repeat blood work.   MEDICAL HISTORY: Past Medical History:  Diagnosis Date   AICD (automatic cardioverter/defibrillator) present 2005   CAD (coronary artery disease) 12/01/2013   Cardiac arrest [facility])    Chronic combined systolic and diastolic CHF, NYHA class 1 ([place]) 12/01/2013   Erectile dysfunction 12/01/2013   HTN (hypertension) 12/01/2013   Hyperlipidemia 12/01/2013   Ischemic cardiomyopathy 12/01/2013   Presence of permanent cardiac pacemaker    Sleep apnea     ALLERGIES:  has No Known Allergies.  MEDICATIONS:  Current Outpatient Medications  Medication Sig Dispense Refill   acetaminophen (TYLENOL) 325 MG tablet Take 1-2 tablets (325-650 mg total) by mouth every 4 (four) hours as needed for mild pain.     ALPRAZolam (XANAX) 0.25 MG tablet Take 1 tablet (0.25 mg total) by mouth at bedtime as needed for anxiety or sleep. 30 tablet 0   amiodarone (PACERONE) 200 MG tablet Take 2 tablets (400 mg total) by mouth daily. Please keep scheduled appointment for future refills. Thank you. 180 tablet 0  apixaban (ELIQUIS) 5 MG TABS tablet Take 1 tablet (5 mg total) by mouth 2 (two) times daily. 180 tablet 1   atorvastatin (LIPITOR) 80 MG tablet Take 1 tablet (80 mg total) by mouth every evening. Please schedule appointment for additional refills. 90 tablet 1   calcium carbonate (TUMS - DOSED IN MG ELEMENTAL CALCIUM) 500 MG chewable tablet Chew 1 tablet by mouth daily.     capecitabine (XELODA) 500 MG tablet Take 4 tabs every 12 hours for 14 days then off for 7 days, take after a meal 112 tablet 1   clopidogrel (PLAVIX) 75 MG tablet Take 1 tablet (75 mg total) by mouth daily. Please schedule appointment for  additional refills. 90 tablet 1   dapagliflozin propanediol (FARXIGA) 10 MG TABS tablet Take 1 tablet (10 mg total) by mouth daily. Please contact the office to schedule appointment for additional refills. 90 tablet 1   diclofenac Sodium (VOLTAREN) 1 % GEL Apply 2 g topically 4 (four) times daily. (Patient taking differently: Apply 2 g topically daily as needed (For pain).)     ezetimibe (ZETIA) 10 MG tablet Take 1 tablet (10 mg total) by mouth daily. Please contact the office to schedule appointment for additional refills. 90 tablet 1   furosemide (LASIX) 40 MG tablet Take 1 tablet (40 mg total) by mouth daily. 90 tablet 1   levETIRAcetam (KEPPRA) 750 MG tablet Take 2 tablets twice a day 360 tablet 3   levothyroxine (SYNTHROID) 50 MCG tablet Take 1 tablet (50 mcg total) by mouth daily at 6 (six) AM. 90 tablet 0   metoprolol succinate (TOPROL-XL) 50 MG 24 hr tablet Take 1 tablet (50 mg total) by mouth 2 (two) times daily. Take with or immediately following a meal. Please schedule appointment for additional refills. 180 tablet 1   naloxone (NARCAN) nasal spray 4 mg/0.1 mL Use as needed in case of overdose 2 each 0   polyethylene glycol (MIRALAX / GLYCOLAX) 17 g packet Take 17 g by mouth daily as needed for mild constipation.     potassium chloride (KLOR-CON M) 10 MEQ tablet Take 1 tablet (10 mEq total) by mouth daily. 90 tablet 1   sacubitril-valsartan (ENTRESTO) 49-51 MG Take 1 tablet by mouth 2 (two) times daily. Please contact the office to schedule appointment for additional refills. 180 tablet 1   traZODone (DESYREL) 50 MG tablet Take 1 tablet (50 mg total) by mouth at bedtime. 90 tablet 3   No current facility-administered medications for this visit.    SURGICAL HISTORY:  Past Surgical History:  Procedure Laterality Date   ACHILLES TENDON REPAIR     lft foot   BIOPSY  04/16/2022   Procedure: BIOPSY;  Surgeon: Yetta Flock, MD;  Location: [facility];  Service: Gastroenterology;;    CARDIAC CATHETERIZATION N/A 05/05/2016   Procedure: Left Heart Cath and Coronary Angiography;  Surgeon: Leonie Man, MD;  Location: [place] CV [facility];  Service: Cardiovascular;  Laterality: N/A;   CARDIAC DEFIBRILLATOR PLACEMENT     CARDIOVERSION N/A 09/13/2020   Procedure: CARDIOVERSION;  Surgeon: Werner Lean, MD;  Location: [facility];  Service: Cardiovascular;  Laterality: N/A;   CARDIOVERSION N/A 09/19/2020   Procedure: CARDIOVERSION;  Surgeon: [name], MD;  Location: [facility] ENDOSCOPY;  Service: Cardiovascular;  Laterality: N/A;   COLON RESECTION N/A 04/18/2022   Procedure: HAND ASSISTED LAPAROSCOPIC RIGHT COLON RESECTION;  Surgeon: Felicie Morn, MD;  Location: [place];  Service: General;  Laterality: N/A;  COLONOSCOPY WITH PROPOFOL N/A 04/16/2022   Procedure: COLONOSCOPY WITH PROPOFOL;  Surgeon: Yetta Flock, MD;  Location: [place];  Service: Gastroenterology;  Laterality: N/A;   EP IMPLANTABLE DEVICE N/A 12/19/2016   Procedure: ICD Generator Changeout;  Surgeon: [name], MD;  Location: [place] CV LAB;  Service: Cardiovascular;  Laterality: N/A;   ESOPHAGOGASTRODUODENOSCOPY N/A 04/16/2022   Procedure: ESOPHAGOGASTRODUODENOSCOPY (EGD);  Surgeon: Yetta Flock, MD;  Location: [facility] ENDOSCOPY;  Service: Gastroenterology;  Laterality: N/A;   IR ANGIOGRAM SELECTIVE EACH ADDITIONAL VESSEL  05/09/2022   IR ANGIOGRAM VISCERAL SELECTIVE  05/09/2022   IR CATHETER TUBE CHANGE  05/14/2022   IR CATHETER TUBE CHANGE  06/27/2022   IR EMBO ART  VEN HEMORR LYMPH EXTRAV  INC GUIDE ROADMAPPING  05/09/2022   IR IMAGE GUIDED DRAINAGE PERCUT CATH  PERITONEAL RETROPERIT  05/09/2022   IR PATIENT EVAL TECH 0-60 MINS  06/11/2022   IR RADIOLOGIST EVAL & MGMT  06/26/2022   IR RADIOLOGIST EVAL & MGMT  07/16/2022   IR THORACENTESIS ASP PLEURAL SPACE W/IMG GUIDE  09/23/2022   IR US GUIDE VASC ACCESS RIGHT  05/09/2022   LEFT HEART CATH AND CORONARY ANGIOGRAPHY N/A 04/17/2022    Procedure: LEFT HEART CATH AND CORONARY ANGIOGRAPHY;  Surgeon: Belva Crome, MD;  Location: [place] CV LAB;  Service: Cardiovascular;  Laterality: N/A;   POLYPECTOMY  04/16/2022   Procedure: POLYPECTOMY;  Surgeon: Yetta Flock, MD;  Location: [facility] ENDOSCOPY;  Service: Gastroenterology;;   RIGHT/LEFT HEART CATH AND CORONARY ANGIOGRAPHY N/A 09/24/2020   Procedure: RIGHT/LEFT HEART CATH AND CORONARY ANGIOGRAPHY;  Surgeon: Lorretta Harp, MD;  Location: [place] CV LAB;  Service: Cardiovascular;  Laterality: N/A;   SUBMUCOSAL TATTOO INJECTION  04/16/2022   Procedure: SUBMUCOSAL TATTOO INJECTION;  Surgeon: Yetta Flock, MD;  Location: [facility] ENDOSCOPY;  Service: Gastroenterology;;   WRIST TENODESIS      REVIEW OF SYSTEMS:   Review of Systems  Constitutional: Positive for stable fatigue. Negative for appetite change, chills, fever and unexpected weight change.  HENT: Negative for mouth sores, nosebleeds, sore throat and trouble swallowing.   Eyes: Negative for eye problems and icterus.  Respiratory: Negative for cough, hemoptysis, shortness of breath and wheezing.   Cardiovascular: Positive for mild bilateral ankle swelling. Negative for chest pain. Gastrointestinal: Negative for abdominal pain, constipation, diarrhea (none at this time), nausea and vomiting.  Genitourinary: Negative for bladder incontinence, difficulty urinating, dysuria, frequency and hematuria.   Musculoskeletal: Positive for hip pain. Negative for back pain, gait problem, neck pain and neck stiffness.  Skin: Positive for dry skin. Positive for rash on chest. Negative for itching.  Neurological: Negative for dizziness, extremity weakness, gait problem, headaches, light-headedness and seizures.  Hematological: Negative for adenopathy. Does not bruise/bleed easily.  Psychiatric/Behavioral: Negative for confusion, depression and sleep disturbance. The patient is not nervous/anxious.     PHYSICAL EXAMINATION:   Blood pressure (!) 136/92, pulse (!) 55, temperature 97.7 F (36.5 C), temperature source Oral, resp. rate 18, weight 216 lb 3.2 oz (98.1 kg), SpO2 99 %.  ECOG PERFORMANCE STATUS: 2  Physical Exam  Constitutional: Oriented to person, place, and time and well-developed, well-nourished, and in no distress.  HENT:  Head: Normocephalic and atraumatic.  Mouth/Throat: Oropharynx is clear and moist. No oropharyngeal exudate.  Eyes: Conjunctivae are normal. Right eye exhibits no discharge. Left eye exhibits no discharge. No scleral icterus.  Neck: Normal range of motion. Neck supple.  Cardiovascular: Normal rate, regular rhythm, normal  heart sounds and intact distal pulses.   Pulmonary/Chest: Effort normal and breath sounds normal. No respiratory distress. No wheezes. No rales.  Abdominal: Soft. Bowel sounds are normal. Exhibits no distension and no mass. There is no tenderness.  Musculoskeletal: Normal range of motion. Exhibits no edema.  Lymphadenopathy:    No cervical adenopathy.  Neurological: Alert and oriented to person, place, and time. Exhibits normal muscle tone. Examine in the wheelchair.  Skin: Dry skin noted on hands. Few scattered skin lesions on chest. Skin is warm and dry. Not diaphoretic. No erythema. No pallor.  Psychiatric: Mood, memory and judgment normal.  Vitals reviewed.  LABORATORY DATA: Lab Results  Component Value Date   WBC 6.9 12/22/2022   HGB 14.9 12/22/2022   HCT 41.7 12/22/2022   MCV 99.5 12/22/2022   PLT 144 (L) 12/22/2022      Chemistry      Component Value Date/Time   NA 140 12/22/2022 1054   NA 137 04/11/2022 1219   K 3.7 12/22/2022 1054   CL 104 12/22/2022 1054   CO2 31 12/22/2022 1054   BUN 16 12/22/2022 1054   BUN 13 04/11/2022 1219   CREATININE 1.12 12/22/2022 1054   CREATININE 1.08 12/12/2016 1445      Component Value Date/Time   CALCIUM 8.9 12/22/2022 1054   ALKPHOS 136 (H) 12/22/2022 1054   AST 48 (H) 12/22/2022 1054   ALT 52 (H)  12/22/2022 1054   BILITOT 0.6 12/22/2022 1054   BILITOT 0.2 02/28/2022 1304       RADIOGRAPHIC STUDIES:  No results found.   ASSESSMENT/PLAN:  Gregory [name] is a 62 y.o. male with    1. 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 S/p resection on 04/18/22 by Dr. Thermon Leyland, path showed: 6.4 cm moderate to poorly differentiated adenocarcinoma; 11/35 positive nodes; lymphovascular invasion present; margins negative. MMR normal. Of note, appendix was uninvolved.  -postoperative course complicated by intra-abdominal abscess, which required drain placement. -he started adjuvant Xeloda on 07/21/22. He tolerated well with fatigue and worsening skin peeling in feet. He notes skin dryness was present prior to starting treatment. -staging PET scan 07/24/22 showed: numerous hepatic metastatic lesions, two small hypermetabolic upper abdominal lymph nodes; no metastatic disease in chest or bony structures. 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 -CT angio chest and AP 09/22/22 while in the hospital showed: unchanged presumed metastatic lesions in liver and adrenal glands; enlargement of complex cystic masses in LUQ around spleen, likely metastases but abscess not ruled out. -s/p thoracentesis 10/10, cytology was benign. -given his recent hospitalizations, Dr. Burr Medico does not plan to add iv chemo until he progresses on Xeloda. -he has recovered well from his hospital stays.  -Most recent dose of Xeloda cycle on 12/22/22 -he is not a candidate for Avastin due to his recent cardiac event  -Labs reviewed. CBC unremarkable except mild thrombocytopenia plt count 144k. CMP stable except slightly/minimally elevated LFTs AST 48 and ALT 52 and Alk phos  136. Will monitor. Of note, the patient does take tylenol daily due to hip pain. Ferritin and CEA pending.  -Dr. Burr Medico reviewed EKG from 12/03/22 which is acceptable. Will repeat EKG at next office visit. Will also arrange for restaging CT scan CAP.  -F/u in 5 weeks with Dr. Burr Medico  to review CT, EKG, and repeat labs.    2. Recent Hospitalization 9/16 - 10/5 -presented with seizure, experienced stroke, cardiac arrest, and aspiration pneumonia in the hospital. -he is on levetiracetam and followed by Dr. Delice Lesch.   3. CHF, CAD, HTN, s/p AICD, seizure  -continue meds and f/u with PCP and other specialists  -EF 35-40% F/U with cardiology, most recent appointment on 12/03/22  4. Mild ankle swelling bilaterally -Mild ankle swelling bilaterally on exam that reportedly waxes and wanes.  -No calf pain or erythema -The patient reports he eats a lot of soup. Encouraged to elevate, use compression stockings, and avoid salty foods (such as soups).    5. Mild rash on chest -Developed a few scattered skin lesions after using electric blanket- Denies pruritis  -Stopped using electric blanket. Will monitor.    PLAN: -lab reviewed  -He started this cycle Xeloda on 1/8. Next cycle due to start 01/12/23 -Refilled Xanax. They requested refill of trazodone but he has 3 refills on this prescription -Dr. Burr Medico reviewed recent EKG. Will repeat at next visit -mild swelling and management discussed -Reviewed how to take imodium if needed.  -Repeat CT CAP for restaging prior to next appointment  Orders Placed This Encounter  Procedures   CT CHEST ABDOMEN PELVIS W CONTRAST    Standing Status:   Future    Standing Expiration Date:   12/23/2023    Order Specific Question:   If indicated for the ordered procedure, I authorize the administration of contrast media per Radiology protocol    Answer:   Yes    Order Specific Question:   Does the patient have a contrast media/X-ray dye allergy?    Answer:   No    Order  Specific Question:   Preferred imaging location?    Answer:   [facility]    Order Specific Question:   Is Oral Contrast requested for this exam?    Answer:   Yes, Per Radiology protocol      The total time spent in the appointment was 20-29 minutes.  Raimi Guillermo L Adria Costley, PA-C 12/22/22

## 2022-12-22 ENCOUNTER — Ambulatory Visit: Payer: PRIVATE HEALTH INSURANCE | Admitting: Physician Assistant

## 2022-12-22 ENCOUNTER — Inpatient Hospital Stay: Payer: Commercial Managed Care - HMO | Attending: Hematology

## 2022-12-22 ENCOUNTER — Inpatient Hospital Stay (HOSPITAL_BASED_OUTPATIENT_CLINIC_OR_DEPARTMENT_OTHER): Payer: Commercial Managed Care - HMO | Admitting: Physician Assistant

## 2022-12-22 ENCOUNTER — Other Ambulatory Visit (HOSPITAL_COMMUNITY): Payer: Self-pay

## 2022-12-22 ENCOUNTER — Telehealth: Payer: Self-pay

## 2022-12-22 ENCOUNTER — Other Ambulatory Visit: Payer: Self-pay

## 2022-12-22 ENCOUNTER — Inpatient Hospital Stay: Payer: Commercial Managed Care - HMO

## 2022-12-22 VITALS — BP 136/92 | HR 55 | Temp 97.7°F | Resp 18 | Wt 216.2 lb

## 2022-12-22 DIAGNOSIS — Z79899 Other long term (current) drug therapy: Secondary | ICD-10-CM | POA: Diagnosis not present

## 2022-12-22 DIAGNOSIS — Z7902 Long term (current) use of antithrombotics/antiplatelets: Secondary | ICD-10-CM | POA: Diagnosis not present

## 2022-12-22 DIAGNOSIS — D509 Iron deficiency anemia, unspecified: Secondary | ICD-10-CM

## 2022-12-22 DIAGNOSIS — Z7901 Long term (current) use of anticoagulants: Secondary | ICD-10-CM | POA: Diagnosis not present

## 2022-12-22 DIAGNOSIS — I110 Hypertensive heart disease with heart failure: Secondary | ICD-10-CM | POA: Insufficient documentation

## 2022-12-22 DIAGNOSIS — D124 Benign neoplasm of descending colon: Secondary | ICD-10-CM | POA: Diagnosis not present

## 2022-12-22 DIAGNOSIS — D125 Benign neoplasm of sigmoid colon: Secondary | ICD-10-CM | POA: Insufficient documentation

## 2022-12-22 DIAGNOSIS — R5383 Other fatigue: Secondary | ICD-10-CM | POA: Diagnosis not present

## 2022-12-22 DIAGNOSIS — C181 Malignant neoplasm of appendix: Secondary | ICD-10-CM | POA: Insufficient documentation

## 2022-12-22 DIAGNOSIS — R21 Rash and other nonspecific skin eruption: Secondary | ICD-10-CM | POA: Diagnosis not present

## 2022-12-22 DIAGNOSIS — R197 Diarrhea, unspecified: Secondary | ICD-10-CM | POA: Insufficient documentation

## 2022-12-22 DIAGNOSIS — R569 Unspecified convulsions: Secondary | ICD-10-CM | POA: Insufficient documentation

## 2022-12-22 DIAGNOSIS — C182 Malignant neoplasm of ascending colon: Secondary | ICD-10-CM | POA: Insufficient documentation

## 2022-12-22 DIAGNOSIS — I5042 Chronic combined systolic (congestive) and diastolic (congestive) heart failure: Secondary | ICD-10-CM | POA: Diagnosis not present

## 2022-12-22 DIAGNOSIS — M25559 Pain in unspecified hip: Secondary | ICD-10-CM | POA: Insufficient documentation

## 2022-12-22 DIAGNOSIS — I255 Ischemic cardiomyopathy: Secondary | ICD-10-CM | POA: Insufficient documentation

## 2022-12-22 DIAGNOSIS — C787 Secondary malignant neoplasm of liver and intrahepatic bile duct: Secondary | ICD-10-CM | POA: Diagnosis not present

## 2022-12-22 DIAGNOSIS — I700 Atherosclerosis of aorta: Secondary | ICD-10-CM | POA: Diagnosis not present

## 2022-12-22 DIAGNOSIS — I2510 Atherosclerotic heart disease of native coronary artery without angina pectoris: Secondary | ICD-10-CM | POA: Diagnosis not present

## 2022-12-22 DIAGNOSIS — K648 Other hemorrhoids: Secondary | ICD-10-CM | POA: Insufficient documentation

## 2022-12-22 DIAGNOSIS — D123 Benign neoplasm of transverse colon: Secondary | ICD-10-CM | POA: Insufficient documentation

## 2022-12-22 LAB — CBC WITH DIFFERENTIAL/PLATELET
Abs Immature Granulocytes: 0.01 10*3/uL (ref 0.00–0.07)
Basophils Absolute: 0 10*3/uL (ref 0.0–0.1)
Basophils Relative: 0 %
Eosinophils Absolute: 0.6 10*3/uL — ABNORMAL HIGH (ref 0.0–0.5)
Eosinophils Relative: 9 %
HCT: 41.7 % (ref 39.0–52.0)
Hemoglobin: 14.9 g/dL (ref 13.0–17.0)
Immature Granulocytes: 0 %
Lymphocytes Relative: 17 %
Lymphs Abs: 1.2 10*3/uL (ref 0.7–4.0)
MCH: 35.6 pg — ABNORMAL HIGH (ref 26.0–34.0)
MCHC: 35.7 g/dL (ref 30.0–36.0)
MCV: 99.5 fL (ref 80.0–100.0)
Monocytes Absolute: 0.6 10*3/uL (ref 0.1–1.0)
Monocytes Relative: 8 %
Neutro Abs: 4.5 10*3/uL (ref 1.7–7.7)
Neutrophils Relative %: 66 %
Platelets: 144 10*3/uL — ABNORMAL LOW (ref 150–400)
RBC: 4.19 MIL/uL — ABNORMAL LOW (ref 4.22–5.81)
RDW: 21.7 % — ABNORMAL HIGH (ref 11.5–15.5)
WBC: 6.9 10*3/uL (ref 4.0–10.5)
nRBC: 0 % (ref 0.0–0.2)

## 2022-12-22 LAB — COMPREHENSIVE METABOLIC PANEL
ALT: 52 U/L — ABNORMAL HIGH (ref 0–44)
AST: 48 U/L — ABNORMAL HIGH (ref 15–41)
Albumin: 4.1 g/dL (ref 3.5–5.0)
Alkaline Phosphatase: 136 U/L — ABNORMAL HIGH (ref 38–126)
Anion gap: 5 (ref 5–15)
BUN: 16 mg/dL (ref 8–23)
CO2: 31 mmol/L (ref 22–32)
Calcium: 8.9 mg/dL (ref 8.9–10.3)
Chloride: 104 mmol/L (ref 98–111)
Creatinine, Ser: 1.12 mg/dL (ref 0.61–1.24)
GFR, Estimated: >60 mL/min (ref >60)
Glucose, Bld: 89 mg/dL (ref 70–99)
Potassium: 3.7 mmol/L (ref 3.5–5.1)
Sodium: 140 mmol/L (ref 135–145)
Total Bilirubin: 0.6 mg/dL (ref 0.3–1.2)
Total Protein: 6.7 g/dL (ref 6.5–8.1)

## 2022-12-22 LAB — FERRITIN: Ferritin: 34 ng/mL (ref 24–336)

## 2022-12-22 LAB — CEA (IN HOUSE-CHCC): CEA (CHCC-In House): 7.78 ng/mL — ABNORMAL HIGH (ref 0.00–5.00)

## 2022-12-22 MED ORDER — ALPRAZOLAM 0.25 MG PO TABS: 0.2500 mg | ORAL_TABLET | Freq: Every evening | ORAL | 0 refills | Status: AC | PRN

## 2022-12-22 NOTE — Telephone Encounter (Signed)
This nurse reached out to patient and made him aware that is follow up will be in 5 weeks and advised that scan will be expected by 2/8.  Also advised that xanax refill will be sent in and the Trazodone was submitted by the Neurologist last month for a 90 day supply with 3 refills.  Informed that he just needs to call the pharmacy and request a refill.  Patient acknowledged understanding.  No further questions or calls noted at this time.

## 2023-01-01 ENCOUNTER — Other Ambulatory Visit (HOSPITAL_COMMUNITY): Payer: Self-pay

## 2023-01-05 ENCOUNTER — Other Ambulatory Visit (HOSPITAL_COMMUNITY): Payer: Self-pay

## 2023-01-06 ENCOUNTER — Encounter: Payer: Self-pay | Admitting: Hematology

## 2023-01-07 ENCOUNTER — Emergency Department (HOSPITAL_COMMUNITY): Payer: Commercial Managed Care - HMO

## 2023-01-07 ENCOUNTER — Other Ambulatory Visit: Payer: Self-pay

## 2023-01-07 ENCOUNTER — Inpatient Hospital Stay (HOSPITAL_COMMUNITY)
Admission: EM | Admit: 2023-01-07 | Discharge: 2023-01-15 | DRG: 308 | Disposition: E | Payer: Commercial Managed Care - HMO | Attending: Internal Medicine | Admitting: Internal Medicine

## 2023-01-07 ENCOUNTER — Encounter (HOSPITAL_COMMUNITY): Payer: Self-pay

## 2023-01-07 ENCOUNTER — Other Ambulatory Visit (HOSPITAL_COMMUNITY): Payer: Self-pay

## 2023-01-07 ENCOUNTER — Telehealth: Payer: Self-pay

## 2023-01-07 DIAGNOSIS — Z955 Presence of coronary angioplasty implant and graft: Secondary | ICD-10-CM

## 2023-01-07 DIAGNOSIS — Z7902 Long term (current) use of antithrombotics/antiplatelets: Secondary | ICD-10-CM

## 2023-01-07 DIAGNOSIS — I700 Atherosclerosis of aorta: Secondary | ICD-10-CM | POA: Diagnosis not present

## 2023-01-07 DIAGNOSIS — I2582 Chronic total occlusion of coronary artery: Secondary | ICD-10-CM | POA: Diagnosis present

## 2023-01-07 DIAGNOSIS — Z1152 Encounter for screening for COVID-19: Secondary | ICD-10-CM

## 2023-01-07 DIAGNOSIS — E861 Hypovolemia: Secondary | ICD-10-CM | POA: Diagnosis not present

## 2023-01-07 DIAGNOSIS — I358 Other nonrheumatic aortic valve disorders: Secondary | ICD-10-CM | POA: Diagnosis present

## 2023-01-07 DIAGNOSIS — E860 Dehydration: Secondary | ICD-10-CM | POA: Diagnosis present

## 2023-01-07 DIAGNOSIS — I509 Heart failure, unspecified: Secondary | ICD-10-CM | POA: Diagnosis not present

## 2023-01-07 DIAGNOSIS — R339 Retention of urine, unspecified: Secondary | ICD-10-CM | POA: Diagnosis not present

## 2023-01-07 DIAGNOSIS — D125 Benign neoplasm of sigmoid colon: Secondary | ICD-10-CM | POA: Diagnosis not present

## 2023-01-07 DIAGNOSIS — Z808 Family history of malignant neoplasm of other organs or systems: Secondary | ICD-10-CM

## 2023-01-07 DIAGNOSIS — Z95810 Presence of automatic (implantable) cardiac defibrillator: Secondary | ICD-10-CM

## 2023-01-07 DIAGNOSIS — G4733 Obstructive sleep apnea (adult) (pediatric): Secondary | ICD-10-CM | POA: Diagnosis present

## 2023-01-07 DIAGNOSIS — C7889 Secondary malignant neoplasm of other digestive organs: Secondary | ICD-10-CM | POA: Diagnosis present

## 2023-01-07 DIAGNOSIS — R578 Other shock: Secondary | ICD-10-CM | POA: Diagnosis not present

## 2023-01-07 DIAGNOSIS — R531 Weakness: Principal | ICD-10-CM

## 2023-01-07 DIAGNOSIS — N179 Acute kidney failure, unspecified: Secondary | ICD-10-CM | POA: Diagnosis not present

## 2023-01-07 DIAGNOSIS — I472 Ventricular tachycardia, unspecified: Principal | ICD-10-CM | POA: Diagnosis present

## 2023-01-07 DIAGNOSIS — E039 Hypothyroidism, unspecified: Secondary | ICD-10-CM | POA: Diagnosis present

## 2023-01-07 DIAGNOSIS — Z515 Encounter for palliative care: Secondary | ICD-10-CM

## 2023-01-07 DIAGNOSIS — Z7401 Bed confinement status: Secondary | ICD-10-CM

## 2023-01-07 DIAGNOSIS — R627 Adult failure to thrive: Secondary | ICD-10-CM | POA: Diagnosis present

## 2023-01-07 DIAGNOSIS — Z8249 Family history of ischemic heart disease and other diseases of the circulatory system: Secondary | ICD-10-CM

## 2023-01-07 DIAGNOSIS — I110 Hypertensive heart disease with heart failure: Secondary | ICD-10-CM | POA: Diagnosis not present

## 2023-01-07 DIAGNOSIS — I77810 Thoracic aortic ectasia: Secondary | ICD-10-CM | POA: Diagnosis present

## 2023-01-07 DIAGNOSIS — Z79899 Other long term (current) drug therapy: Secondary | ICD-10-CM | POA: Diagnosis not present

## 2023-01-07 DIAGNOSIS — C787 Secondary malignant neoplasm of liver and intrahepatic bile duct: Secondary | ICD-10-CM | POA: Diagnosis present

## 2023-01-07 DIAGNOSIS — I255 Ischemic cardiomyopathy: Secondary | ICD-10-CM | POA: Diagnosis present

## 2023-01-07 DIAGNOSIS — Z9221 Personal history of antineoplastic chemotherapy: Secondary | ICD-10-CM

## 2023-01-07 DIAGNOSIS — Z7189 Other specified counseling: Secondary | ICD-10-CM | POA: Diagnosis not present

## 2023-01-07 DIAGNOSIS — I959 Hypotension, unspecified: Secondary | ICD-10-CM | POA: Diagnosis not present

## 2023-01-07 DIAGNOSIS — N1831 Chronic kidney disease, stage 3a: Secondary | ICD-10-CM | POA: Diagnosis present

## 2023-01-07 DIAGNOSIS — R7401 Elevation of levels of liver transaminase levels: Secondary | ICD-10-CM | POA: Diagnosis present

## 2023-01-07 DIAGNOSIS — G40909 Epilepsy, unspecified, not intractable, without status epilepticus: Secondary | ICD-10-CM | POA: Diagnosis present

## 2023-01-07 DIAGNOSIS — I493 Ventricular premature depolarization: Secondary | ICD-10-CM | POA: Diagnosis present

## 2023-01-07 DIAGNOSIS — E876 Hypokalemia: Secondary | ICD-10-CM | POA: Diagnosis present

## 2023-01-07 DIAGNOSIS — Z79890 Hormone replacement therapy (postmenopausal): Secondary | ICD-10-CM

## 2023-01-07 DIAGNOSIS — A419 Sepsis, unspecified organism: Secondary | ICD-10-CM | POA: Diagnosis not present

## 2023-01-07 DIAGNOSIS — Z66 Do not resuscitate: Secondary | ICD-10-CM | POA: Diagnosis present

## 2023-01-07 DIAGNOSIS — R6521 Severe sepsis with septic shock: Secondary | ICD-10-CM | POA: Diagnosis not present

## 2023-01-07 DIAGNOSIS — R7989 Other specified abnormal findings of blood chemistry: Secondary | ICD-10-CM | POA: Diagnosis present

## 2023-01-07 DIAGNOSIS — C7970 Secondary malignant neoplasm of unspecified adrenal gland: Secondary | ICD-10-CM | POA: Diagnosis present

## 2023-01-07 DIAGNOSIS — E43 Unspecified severe protein-calorie malnutrition: Secondary | ICD-10-CM | POA: Diagnosis present

## 2023-01-07 DIAGNOSIS — I480 Paroxysmal atrial fibrillation: Secondary | ICD-10-CM | POA: Diagnosis present

## 2023-01-07 DIAGNOSIS — I2510 Atherosclerotic heart disease of native coronary artery without angina pectoris: Secondary | ICD-10-CM | POA: Diagnosis not present

## 2023-01-07 DIAGNOSIS — C19 Malignant neoplasm of rectosigmoid junction: Secondary | ICD-10-CM | POA: Diagnosis present

## 2023-01-07 DIAGNOSIS — M25559 Pain in unspecified hip: Secondary | ICD-10-CM | POA: Diagnosis not present

## 2023-01-07 DIAGNOSIS — Z7901 Long term (current) use of anticoagulants: Secondary | ICD-10-CM | POA: Diagnosis not present

## 2023-01-07 DIAGNOSIS — R601 Generalized edema: Secondary | ICD-10-CM | POA: Diagnosis not present

## 2023-01-07 DIAGNOSIS — E669 Obesity, unspecified: Secondary | ICD-10-CM | POA: Diagnosis present

## 2023-01-07 DIAGNOSIS — Z87891 Personal history of nicotine dependence: Secondary | ICD-10-CM

## 2023-01-07 DIAGNOSIS — R0609 Other forms of dyspnea: Secondary | ICD-10-CM | POA: Diagnosis not present

## 2023-01-07 DIAGNOSIS — R570 Cardiogenic shock: Secondary | ICD-10-CM | POA: Diagnosis not present

## 2023-01-07 DIAGNOSIS — C772 Secondary and unspecified malignant neoplasm of intra-abdominal lymph nodes: Secondary | ICD-10-CM | POA: Diagnosis present

## 2023-01-07 DIAGNOSIS — Z6825 Body mass index (BMI) 25.0-25.9, adult: Secondary | ICD-10-CM

## 2023-01-07 DIAGNOSIS — G8929 Other chronic pain: Secondary | ICD-10-CM | POA: Diagnosis present

## 2023-01-07 DIAGNOSIS — R21 Rash and other nonspecific skin eruption: Secondary | ICD-10-CM | POA: Diagnosis not present

## 2023-01-07 DIAGNOSIS — I130 Hypertensive heart and chronic kidney disease with heart failure and stage 1 through stage 4 chronic kidney disease, or unspecified chronic kidney disease: Secondary | ICD-10-CM | POA: Diagnosis present

## 2023-01-07 DIAGNOSIS — R197 Diarrhea, unspecified: Secondary | ICD-10-CM | POA: Diagnosis not present

## 2023-01-07 DIAGNOSIS — Z8674 Personal history of sudden cardiac arrest: Secondary | ICD-10-CM

## 2023-01-07 DIAGNOSIS — K648 Other hemorrhoids: Secondary | ICD-10-CM | POA: Diagnosis not present

## 2023-01-07 DIAGNOSIS — R6883 Chills (without fever): Secondary | ICD-10-CM | POA: Diagnosis not present

## 2023-01-07 DIAGNOSIS — D124 Benign neoplasm of descending colon: Secondary | ICD-10-CM | POA: Diagnosis not present

## 2023-01-07 DIAGNOSIS — R5383 Other fatigue: Secondary | ICD-10-CM | POA: Diagnosis not present

## 2023-01-07 DIAGNOSIS — M549 Dorsalgia, unspecified: Secondary | ICD-10-CM | POA: Diagnosis present

## 2023-01-07 DIAGNOSIS — R5381 Other malaise: Secondary | ICD-10-CM | POA: Diagnosis present

## 2023-01-07 DIAGNOSIS — I5022 Chronic systolic (congestive) heart failure: Secondary | ICD-10-CM | POA: Diagnosis present

## 2023-01-07 DIAGNOSIS — R0603 Acute respiratory distress: Secondary | ICD-10-CM | POA: Diagnosis not present

## 2023-01-07 DIAGNOSIS — I5042 Chronic combined systolic (congestive) and diastolic (congestive) heart failure: Secondary | ICD-10-CM | POA: Diagnosis not present

## 2023-01-07 DIAGNOSIS — C181 Malignant neoplasm of appendix: Secondary | ICD-10-CM | POA: Diagnosis not present

## 2023-01-07 DIAGNOSIS — C182 Malignant neoplasm of ascending colon: Secondary | ICD-10-CM | POA: Diagnosis present

## 2023-01-07 DIAGNOSIS — D123 Benign neoplasm of transverse colon: Secondary | ICD-10-CM | POA: Diagnosis not present

## 2023-01-07 DIAGNOSIS — E785 Hyperlipidemia, unspecified: Secondary | ICD-10-CM | POA: Diagnosis present

## 2023-01-07 DIAGNOSIS — G9341 Metabolic encephalopathy: Secondary | ICD-10-CM | POA: Diagnosis not present

## 2023-01-07 DIAGNOSIS — R579 Shock, unspecified: Secondary | ICD-10-CM | POA: Diagnosis not present

## 2023-01-07 DIAGNOSIS — R569 Unspecified convulsions: Secondary | ICD-10-CM | POA: Diagnosis not present

## 2023-01-07 DIAGNOSIS — E872 Acidosis, unspecified: Secondary | ICD-10-CM | POA: Diagnosis present

## 2023-01-07 LAB — COMPREHENSIVE METABOLIC PANEL
ALT: 221 U/L — ABNORMAL HIGH (ref 0–44)
AST: 193 U/L — ABNORMAL HIGH (ref 15–41)
Albumin: 2.9 g/dL — ABNORMAL LOW (ref 3.5–5.0)
Alkaline Phosphatase: 68 U/L (ref 38–126)
Anion gap: 10 (ref 5–15)
BUN: 27 mg/dL — ABNORMAL HIGH (ref 8–23)
CO2: 17 mmol/L — ABNORMAL LOW (ref 22–32)
Calcium: 7.6 mg/dL — ABNORMAL LOW (ref 8.9–10.3)
Chloride: 109 mmol/L (ref 98–111)
Creatinine, Ser: 1.22 mg/dL (ref 0.61–1.24)
GFR, Estimated: >60 mL/min (ref >60)
Glucose, Bld: 125 mg/dL — ABNORMAL HIGH (ref 70–99)
Potassium: 3.4 mmol/L — ABNORMAL LOW (ref 3.5–5.1)
Sodium: 136 mmol/L (ref 135–145)
Total Bilirubin: 1 mg/dL (ref 0.3–1.2)
Total Protein: 5.2 g/dL — ABNORMAL LOW (ref 6.5–8.1)

## 2023-01-07 LAB — CBC WITH DIFFERENTIAL/PLATELET
Abs Immature Granulocytes: 0.03 10*3/uL (ref 0.00–0.07)
Basophils Absolute: 0 10*3/uL (ref 0.0–0.1)
Basophils Relative: 1 %
Eosinophils Absolute: 0.1 10*3/uL (ref 0.0–0.5)
Eosinophils Relative: 3 %
HCT: 47.1 % (ref 39.0–52.0)
Hemoglobin: 16.4 g/dL (ref 13.0–17.0)
Immature Granulocytes: 1 %
Lymphocytes Relative: 25 %
Lymphs Abs: 0.9 10*3/uL (ref 0.7–4.0)
MCH: 35.3 pg — ABNORMAL HIGH (ref 26.0–34.0)
MCHC: 34.8 g/dL (ref 30.0–36.0)
MCV: 101.5 fL — ABNORMAL HIGH (ref 80.0–100.0)
Monocytes Absolute: 0.7 10*3/uL (ref 0.1–1.0)
Monocytes Relative: 19 %
Neutro Abs: 1.8 10*3/uL (ref 1.7–7.7)
Neutrophils Relative %: 51 %
Platelets: 221 10*3/uL (ref 150–400)
RBC: 4.64 MIL/uL (ref 4.22–5.81)
RDW: 22.9 % — ABNORMAL HIGH (ref 11.5–15.5)
WBC Morphology: INCREASED
WBC: 3.5 10*3/uL — ABNORMAL LOW (ref 4.0–10.5)
nRBC: 0 % (ref 0.0–0.2)

## 2023-01-07 LAB — TROPONIN I (HIGH SENSITIVITY)
Troponin I (High Sensitivity): 18 ng/L — ABNORMAL HIGH (ref <18)
Troponin I (High Sensitivity): 19 ng/L — ABNORMAL HIGH (ref <18)

## 2023-01-07 LAB — RESP PANEL BY RT-PCR (RSV, FLU A&B, COVID)  RVPGX2
Influenza A by PCR: NEGATIVE
Influenza B by PCR: NEGATIVE
Resp Syncytial Virus by PCR: NEGATIVE
SARS Coronavirus 2 by RT PCR: NEGATIVE

## 2023-01-07 LAB — BRAIN NATRIURETIC PEPTIDE: B Natriuretic Peptide: 150.5 pg/mL — ABNORMAL HIGH (ref 0.0–100.0)

## 2023-01-07 LAB — MAGNESIUM: Magnesium: 1.7 mg/dL (ref 1.7–2.4)

## 2023-01-07 MED ORDER — ALPRAZOLAM 0.25 MG PO TABS
0.2500 mg | ORAL_TABLET | Freq: Every evening | ORAL | Status: DC | PRN
  Administered 2023-01-07 – 2023-01-08 (×2): 0.25 mg via ORAL
  Filled 2023-01-07 (×2): qty 1

## 2023-01-07 MED ORDER — MAGNESIUM SULFATE 4 GM/100ML IV SOLN
4.0000 g | Freq: Once | INTRAVENOUS | Status: AC
  Administered 2023-01-07: 4 g via INTRAVENOUS
  Filled 2023-01-07: qty 100

## 2023-01-07 MED ORDER — POTASSIUM CHLORIDE CRYS ER 20 MEQ PO TBCR
40.0000 meq | EXTENDED_RELEASE_TABLET | Freq: Once | ORAL | Status: AC
  Administered 2023-01-07: 40 meq via ORAL
  Filled 2023-01-07: qty 2

## 2023-01-07 MED ORDER — NITROGLYCERIN 0.4 MG SL SUBL: 0.4000 mg | SUBLINGUAL_TABLET | SUBLINGUAL | Status: DC | PRN

## 2023-01-07 MED ORDER — DICLOFENAC SODIUM 1 % EX GEL
2.0000 g | Freq: Every day | CUTANEOUS | Status: DC | PRN
  Filled 2023-01-07: qty 100

## 2023-01-07 MED ORDER — ACETAMINOPHEN 325 MG PO TABS
650.0000 mg | ORAL_TABLET | ORAL | Status: DC | PRN
  Administered 2023-01-08 – 2023-01-12 (×2): 650 mg via ORAL
  Filled 2023-01-07 (×2): qty 2

## 2023-01-07 MED ORDER — ACETAMINOPHEN 500 MG PO TABS
500.0000 mg | ORAL_TABLET | Freq: Four times a day (QID) | ORAL | Status: DC | PRN
  Administered 2023-01-07: 500 mg via ORAL
  Filled 2023-01-07: qty 1

## 2023-01-07 MED ORDER — LEVETIRACETAM 250 MG PO TABS
1500.0000 mg | ORAL_TABLET | Freq: Two times a day (BID) | ORAL | Status: DC
  Administered 2023-01-07 – 2023-01-09 (×5): 1500 mg via ORAL
  Filled 2023-01-07 (×5): qty 3

## 2023-01-07 MED ORDER — ASPIRIN 81 MG PO TBEC: 81.0000 mg | DELAYED_RELEASE_TABLET | Freq: Every day | ORAL | Status: DC

## 2023-01-07 MED ORDER — AMIODARONE HCL IN DEXTROSE 360-4.14 MG/200ML-% IV SOLN
60.0000 mg/h | INTRAVENOUS | Status: AC
  Administered 2023-01-07 (×2): 60 mg/h via INTRAVENOUS
  Filled 2023-01-07 (×2): qty 200

## 2023-01-07 MED ORDER — SACUBITRIL-VALSARTAN 49-51 MG PO TABS
1.0000 | ORAL_TABLET | Freq: Two times a day (BID) | ORAL | Status: DC
  Administered 2023-01-07 – 2023-01-08 (×3): 1 via ORAL
  Filled 2023-01-07 (×4): qty 1

## 2023-01-07 MED ORDER — POTASSIUM CHLORIDE CRYS ER 10 MEQ PO TBCR
10.0000 meq | EXTENDED_RELEASE_TABLET | Freq: Every day | ORAL | Status: DC
  Administered 2023-01-07: 10 meq via ORAL
  Filled 2023-01-07: qty 1

## 2023-01-07 MED ORDER — AMIODARONE HCL IN DEXTROSE 360-4.14 MG/200ML-% IV SOLN
60.0000 mg/h | INTRAVENOUS | Status: DC
  Administered 2023-01-08 – 2023-01-10 (×7): 30 mg/h via INTRAVENOUS
  Administered 2023-01-11 (×2): 60 mg/h via INTRAVENOUS
  Administered 2023-01-11: 30 mg/h via INTRAVENOUS
  Administered 2023-01-12 (×2): 60 mg/h via INTRAVENOUS
  Filled 2023-01-07 (×12): qty 200

## 2023-01-07 MED ORDER — ATORVASTATIN CALCIUM 80 MG PO TABS
80.0000 mg | ORAL_TABLET | Freq: Every evening | ORAL | Status: DC
  Administered 2023-01-07 – 2023-01-09 (×3): 80 mg via ORAL
  Filled 2023-01-07 (×3): qty 1

## 2023-01-07 MED ORDER — DAPAGLIFLOZIN PROPANEDIOL 10 MG PO TABS
10.0000 mg | ORAL_TABLET | Freq: Every day | ORAL | Status: DC
  Administered 2023-01-07 – 2023-01-09 (×3): 10 mg via ORAL
  Filled 2023-01-07 (×4): qty 1

## 2023-01-07 MED ORDER — AMIODARONE LOAD VIA INFUSION
150.0000 mg | Freq: Once | INTRAVENOUS | Status: AC
  Administered 2023-01-07: 150 mg via INTRAVENOUS
  Filled 2023-01-07: qty 83.34

## 2023-01-07 MED ORDER — METOPROLOL SUCCINATE ER 50 MG PO TB24
50.0000 mg | ORAL_TABLET | Freq: Two times a day (BID) | ORAL | Status: DC
  Administered 2023-01-07 – 2023-01-09 (×4): 50 mg via ORAL
  Filled 2023-01-07: qty 2
  Filled 2023-01-07: qty 1
  Filled 2023-01-07: qty 2
  Filled 2023-01-07: qty 1

## 2023-01-07 MED ORDER — TRAZODONE HCL 50 MG PO TABS
50.0000 mg | ORAL_TABLET | Freq: Every day | ORAL | Status: DC
  Administered 2023-01-07 – 2023-01-09 (×3): 50 mg via ORAL
  Filled 2023-01-07 (×3): qty 1

## 2023-01-07 MED ORDER — LEVOTHYROXINE SODIUM 50 MCG PO TABS
50.0000 ug | ORAL_TABLET | Freq: Every day | ORAL | Status: DC
  Administered 2023-01-08 – 2023-01-12 (×3): 50 ug via ORAL
  Filled 2023-01-07 (×4): qty 1

## 2023-01-07 MED ORDER — EZETIMIBE 10 MG PO TABS
10.0000 mg | ORAL_TABLET | Freq: Every day | ORAL | Status: DC
  Administered 2023-01-08 – 2023-01-12 (×3): 10 mg via ORAL
  Filled 2023-01-07 (×4): qty 1

## 2023-01-07 MED ORDER — SODIUM CHLORIDE 0.9 % IV BOLUS
500.0000 mL | Freq: Once | INTRAVENOUS | Status: AC
  Administered 2023-01-07: 500 mL via INTRAVENOUS

## 2023-01-07 MED ORDER — POLYETHYLENE GLYCOL 3350 17 G PO PACK: 17.0000 g | PACK | Freq: Every day | ORAL | Status: DC | PRN

## 2023-01-07 MED ORDER — CLOPIDOGREL BISULFATE 75 MG PO TABS
75.0000 mg | ORAL_TABLET | Freq: Every day | ORAL | Status: DC
  Administered 2023-01-07 – 2023-01-12 (×5): 75 mg via ORAL
  Filled 2023-01-07 (×6): qty 1

## 2023-01-07 MED ORDER — CALCIUM CARBONATE ANTACID 500 MG PO CHEW
1.0000 | CHEWABLE_TABLET | Freq: Every day | ORAL | Status: DC
  Administered 2023-01-07 – 2023-01-12 (×4): 200 mg via ORAL
  Filled 2023-01-07 (×5): qty 1

## 2023-01-07 MED ORDER — FUROSEMIDE 40 MG PO TABS
40.0000 mg | ORAL_TABLET | Freq: Every day | ORAL | Status: DC
  Administered 2023-01-07 – 2023-01-08 (×2): 40 mg via ORAL
  Filled 2023-01-07: qty 1
  Filled 2023-01-07 (×2): qty 2

## 2023-01-07 MED ORDER — APIXABAN 5 MG PO TABS
5.0000 mg | ORAL_TABLET | Freq: Two times a day (BID) | ORAL | Status: DC
  Administered 2023-01-07 – 2023-01-12 (×8): 5 mg via ORAL
  Filled 2023-01-07 (×9): qty 1

## 2023-01-07 NOTE — Telephone Encounter (Signed)
    Spoke with patient, patient did not sound out of breath, advised patient he needed to go to the ED as he was in [place] as of 8:24 this am, informed patient not to drive himself patient voiced understanding. Informed patient I would let the providers at the hospital know he is on his way.

## 2023-01-07 NOTE — ED Notes (Signed)
Pharmacy message '@[phone]'$  requested to have due meds verified.

## 2023-01-07 NOTE — ED Notes (Signed)
[employer] Rep stated pt device is not set to send shock, however; at 8:20 V-Tach episodes were noticed and device attempted 9x to pace pt out of rhythm. Device is functioning properly. Report to be sent soon.

## 2023-01-07 NOTE — ED Notes (Signed)
RN notified EDP about pt BP 84/68

## 2023-01-07 NOTE — ED Provider Notes (Signed)
[facility] Provider Note   CSN: [id number] Arrival date & time: 01/03/2023  4401     History  Chief Complaint  Patient presents with   Defibrillator firing    Gregory [name] is a 62 y.o. male. With past medical history of congestive heart failure, CAD, remote cardiac arrest with AICD, pacemaker, HTN who presents to the emergency department at instruction of defibrillator device monitoring.  Daughter is at bedside and helps to provide history. She states that patient has been more weak over the past 24-36 hours. She states that he is on Xeloda for colorectal cancer and can become fatigued from this. She states that this morning she was helping him get up and dressed. She states on the way back from the bathroom he was very weak, requiring her assistance to ambulate back to a chair. After this happened, the defibrillator device company called, instructing the patient to present to the emergency department. She states she's noticed him appearing more winded over the past 24 hours. Denies recent illnesses or fever. Patient denies chest pain, palpitations, but does endorse feeling mildly short of breath, and lightheaded. Denies melena or hematochezia, cough, dysuria, abdominal pain or other symptoms.   HPI     Home Medications Prior to Admission medications   Medication Sig Start Date End Date Taking? Authorizing Provider  acetaminophen (TYLENOL) 325 MG tablet Take 1-2 tablets (325-650 mg total) by mouth every 4 (four) hours as needed for mild pain. 06/06/22   Setzer, Edman Circle, PA-C  ALPRAZolam Duanne Moron) 0.25 MG tablet Take 1 tablet (0.25 mg total) by mouth at bedtime as needed for anxiety or sleep. 12/22/22   Heilingoetter, Cassandra L, PA-C  amiodarone (PACERONE) 200 MG tablet Take 2 tablets (400 mg total) by mouth daily. Please keep scheduled appointment for future refills. Thank you. 12/18/22   [name], MD  apixaban (ELIQUIS) 5 MG  TABS tablet Take 1 tablet (5 mg total) by mouth 2 (two) times daily. 10/22/22   Croitoru, [name], MD  atorvastatin (LIPITOR) 80 MG tablet Take 1 tablet (80 mg total) by mouth every evening. Please schedule appointment for additional refills. 10/22/22   Croitoru, [name], MD  calcium carbonate (TUMS - DOSED IN MG ELEMENTAL CALCIUM) 500 MG chewable tablet Chew 1 tablet by mouth daily.    [provider]  capecitabine (XELODA) 500 MG tablet Take 4 tabs every 12 hours for 14 days then off for 7 days, take after a meal 10/17/22   [name], MD  clopidogrel (PLAVIX) 75 MG tablet Take 1 tablet (75 mg total) by mouth daily. Please schedule appointment for additional refills. 10/22/22   Croitoru, [name], MD  dapagliflozin propanediol (FARXIGA) 10 MG TABS tablet Take 1 tablet (10 mg total) by mouth daily. Please contact the office to schedule appointment for additional refills. 10/22/22   Croitoru, [name], MD  diclofenac Sodium (VOLTAREN) 1 % GEL Apply 2 g topically 4 (four) times daily. Patient taking differently: Apply 2 g topically daily as needed (For pain). 06/06/22   Setzer, Edman Circle, PA-C  ezetimibe (ZETIA) 10 MG tablet Take 1 tablet (10 mg total) by mouth daily. Please contact the office to schedule appointment for additional refills. 10/22/22 01/20/23  Croitoru, [name], MD  furosemide (LASIX) 40 MG tablet Take 1 tablet (40 mg total) by mouth daily. 10/17/22   [name], MD  levETIRAcetam (KEPPRA) 750 MG tablet Take 2 tablets twice a day 10/17/22   [name],  MD  levothyroxine (SYNTHROID) 50 MCG tablet Take 1 tablet (50 mcg total) by mouth daily at 6 (six) AM. 12/03/22   [name], MD  metoprolol succinate (TOPROL-XL) 50 MG 24 hr tablet Take 1 tablet (50 mg total) by mouth 2 (two) times daily. Take with or immediately following a meal. Please schedule appointment for additional refills. 10/22/22   Croitoru, [name], MD  naloxone [facility]) nasal spray 4 mg/0.1 mL Use as needed in case of overdose  09/18/22   Love, Ivan Anchors, PA-C  polyethylene glycol (MIRALAX / GLYCOLAX) 17 g packet Take 17 g by mouth daily as needed for mild constipation. 09/26/22   Ghimire, Henreitta Leber, MD  potassium chloride (KLOR-CON M) 10 MEQ tablet Take 1 tablet (10 mEq total) by mouth daily. 10/17/22   [name], MD  sacubitril-valsartan (ENTRESTO) 49-51 MG Take 1 tablet by mouth 2 (two) times daily. Please contact the office to schedule appointment for additional refills. 10/22/22   Croitoru, [name], MD  traZODone (DESYREL) 50 MG tablet Take 1 tablet (50 mg total) by mouth at bedtime. 10/17/22   [name], MD      Allergies    Patient has no known allergies.    Review of Systems   Review of Systems  Respiratory:  Positive for shortness of breath.   Neurological:  Positive for weakness and light-headedness.  All other systems reviewed and are negative.   Physical Exam Updated Vital Signs BP 108/61   Pulse 71   Temp 97.7 F (36.5 C) (Oral)   Resp 20   Ht '6\' 6"'$  (1.981 m)   Wt 99.8 kg   SpO2 96%   BMI 25.42 kg/m  Physical Exam Vitals and nursing note reviewed.  Constitutional:      General: He is not in acute distress.    Appearance: Normal appearance. He is ill-appearing. He is not toxic-appearing.  HENT:     Head: Normocephalic.     Nose: Rhinorrhea present.     Mouth/Throat:     Mouth: Mucous membranes are moist.     Pharynx: Oropharynx is clear.  Eyes:     General: No scleral icterus.    Extraocular Movements: Extraocular movements intact.  Cardiovascular:     Rate and Rhythm: Normal rate and regular rhythm.     Pulses: Normal pulses.     Heart sounds: No murmur heard.    Comments: Trace BLE edema  Pulmonary:     Effort: Pulmonary effort is normal. No respiratory distress.     Breath sounds: Normal breath sounds. No stridor. No wheezing, rhonchi or rales.  Abdominal:     General: Bowel sounds are normal. There is no distension.     Palpations: Abdomen is soft.     Tenderness:  There is no abdominal tenderness.  Musculoskeletal:     Cervical back: Neck supple.     Right lower leg: Edema present.     Left lower leg: Edema present.  Skin:    General: Skin is warm and dry.     Capillary Refill: Capillary refill takes less than 2 seconds.  Neurological:     General: No focal deficit present.     Mental Status: He is alert and oriented to person, place, and time. Mental status is at baseline.  Psychiatric:        Mood and Affect: Mood normal.        Behavior: Behavior normal.        Thought Content: Thought content  normal.        Judgment: Judgment normal.     ED Results / Procedures / Treatments   Labs (all labs ordered are listed, but only abnormal results are displayed) Labs Reviewed  COMPREHENSIVE METABOLIC PANEL - Abnormal; Notable for the following components:      Result Value   Potassium 3.4 (*)    CO2 17 (*)    Glucose, Bld 125 (*)    BUN 27 (*)    Calcium 7.6 (*)    Total Protein 5.2 (*)    Albumin 2.9 (*)    AST 193 (*)    ALT 221 (*)    All other components within normal limits  CBC WITH DIFFERENTIAL/PLATELET - Abnormal; Notable for the following components:   WBC 3.5 (*)    MCV 101.5 (*)    MCH 35.3 (*)    RDW 22.9 (*)    All other components within normal limits  BRAIN NATRIURETIC PEPTIDE - Abnormal; Notable for the following components:   B Natriuretic Peptide 150.5 (*)    All other components within normal limits  TROPONIN I (HIGH SENSITIVITY) - Abnormal; Notable for the following components:   Troponin I (High Sensitivity) 18 (*)    All other components within normal limits  TROPONIN I (HIGH SENSITIVITY) - Abnormal; Notable for the following components:   Troponin I (High Sensitivity) 19 (*)    All other components within normal limits  RESP PANEL BY RT-PCR (RSV, FLU A&B, COVID)  RVPGX2  MAGNESIUM    EKG None  Radiology DG Chest Port 1 View  Result Date: 12/22/2022 CLINICAL DATA:  Pacer fired EXAM: PORTABLE CHEST 1  VIEW COMPARISON:  09/23/2022 FINDINGS: Left base subsegmental atelectasis or consolidation and small left-sided effusion are stable findings. Aorta is tortuous and calcified. No pneumothorax. Normal pulmonary vasculature. Left-sided pacemaker. IMPRESSION: Left base subsegmental atelectasis or scarring and minimal left-sided pleural effusion. Otherwise no acute cardiopulmonary process. Electronically Signed   By: Sammie Bench M.D.   On: 01/14/2023 10:10    Procedures Procedures   Medications Ordered in ED Medications  magnesium sulfate IVPB 4 g 100 mL (4 g Intravenous New Bag/Given 12/15/2022 1431)  amiodarone (NEXTERONE) 1.8 mg/mL load via infusion 150 mg (150 mg Intravenous Bolus from Bag 12/16/2022 1332)    Followed by  amiodarone (NEXTERONE PREMIX) 360-4.14 MG/200ML-% (1.8 mg/mL) IV infusion (60 mg/hr Intravenous New Bag/Given 01/04/2023 1332)    Followed by  amiodarone (NEXTERONE PREMIX) 360-4.14 MG/200ML-% (1.8 mg/mL) IV infusion (has no administration in time range)  aspirin EC tablet 81 mg (has no administration in time range)  nitroGLYCERIN (NITROSTAT) SL tablet 0.4 mg (has no administration in time range)  acetaminophen (TYLENOL) tablet 650 mg (has no administration in time range)  sodium chloride 0.9 % bolus 500 mL (0 mLs Intravenous Stopped 12/15/2022 1150)  potassium chloride SA (KLOR-CON M) CR tablet 40 mEq (40 mEq Oral Given 01/02/2023 1438)    ED Course/ Medical Decision Making/ A&P                             Medical Decision Making Amount and/or Complexity of Data Reviewed Labs: ordered. Radiology: ordered.  Risk Decision regarding hospitalization.  Initial Impression and Ddx 62 year old male who presents to the emergency department with weakness and report from [employer] device that he had episode of ventricular tachycardia.  Patient is chronically ill-appearing but in no acute distress at this time. Patient PMH that  increases complexity of ED encounter: Ischemic  cardiomyopathy, congestive heart failure, hypertension, AICD and pacemaker placement  Interpretation of Diagnostics I independent reviewed and interpreted the labs as followed: Troponin 18, 19.  BNP 150.  CMP with transaminitis, potassium 3.4.  COVID and flu negative.  CBC with mild leukopenia.  Mag 1.7.  - I independently visualized the following imaging with scope of interpretation limited to determining acute life threatening conditions related to emergency care: Portable chest, which revealed minimal left-sided pleural effusion  Patient Reassessment and Ultimate Disposition/Management Chronically ill appearing male on chemotherapy and AICD,pacemaker who presents because of device alarm of non-sustained ventricular tachycardia. Daughter noted him to be weak over the past 24 hours and winded.  Troponin x2 flat. Chronically elevated. No chest pain. No EKG changes. Doubt ACS at this time. BNP 150. No pulmonary edema on CXR, clinically does not appear overloaded, although he does have increased transaminitis which I think is likely congestive. Doubt HF exacerbation.  COVID/flu/rsv negative, no PNA. Mag is 1.7, replacing per cards.  Pacemaker was interrogated. Found to have non-sustained VT and pacer attempted to pace out of rhythm 9x. No AICD firing. On chart review appears to have multiple episodes of this VT. I consulted and spoke with EP, Tommye Standard, PA-C who evaluated the patient. Cards placed him on amiodarone drip.  Cards to admit for VT which is likely reason for his weakness over the past two days. Do not see underlying cause at this time for his weakness other than cardiac. Patient is agreeable to admission.   Patient management required discussion with the following services or consulting groups:  Cardiology  Complexity of Problems Addressed Chronic illness with exacerbation  Additional Data Reviewed and Analyzed Further history obtained from: Further history from spouse/family  member, Past medical history and medications listed in the EMR, Recent PCP notes, and Care Everywhere  Patient Encounter Risk Assessment Consideration of hospitalization  Final Clinical Impression(s) / ED Diagnoses Final diagnoses:  Weakness    Rx / DC Orders ED Discharge Orders     None         Mickie Hillier, PA-C 12/15/2022 1502    Valarie Merino, MD 12/24/2022 1505

## 2023-01-07 NOTE — H&P (Addendum)
Cardiology Admission History and Physical   Patient ID: Gregory [name] MRN: [id number]; DOB: October 13, 1961   Admission date: 01/02/2023  PCP:  Patient, No Pcp Per   [place] Cardiologist:  Sanda Klein, MD  Electrophysiologist:  Virl Axe, MD       Chief Complaint:  VT  Patient Profile:   Gregory [name] is a 62 y.o. male with CAD (known chronic occl of RCA with prior PCIs to LAD, diag, and LCx), VT, ICM,  OSA w/CPAP, seizures, chronic CHF, HLD, HTN, obesity, AFib, h/o VT, PVCs  who is being seen 12/17/2022 for the evaluation of VT.  Device information Medtronic Dual Chamber ICD implanted 2005, 2010 and gen change for 2018   2017 to have slow VT prompting reduction of his VT zone rate to 150bpm.   Oct 2021, VT with appropriate therapies   He had stopped drinking (heavily at least).  He noted previously had been on amiodarone and mexiletine , the mexiletine stopped 2/2 GI symptoms, and the amiodarone stopped 2/2 abn ALk phos   Oct 2021 > amio restarted for recurrent VT   History of Present Illness:   Gregory [name] a long hx of VT, known to have a number of different VT's, has been referred to Dr. Lovena Le via Dr. Caryl Comes for consideration of VT ablation. He saw Dr. Lovena Le 03/21/22. He noted  Via his ICD 2  VT morphologies, VT at 188 bpm as well as a VT of almost 300 bpm His note reflected : With at least 3 known morphologies and one very fast, not likely that an ablation would be successful, but perhaps able to improve his VT. Discussed with the patient and his daughter planned to continue high dose amio   He was hospitalized 04/11/22 - 04/25/22 He was found with a colon mass, had multiple episodes of NSVT and cardiology consulted, underwent cath on 04/17/22 with relatively stable CAD, Dr. Ellyn Hack noted: Overall, relatively stable findings similar to previous, but there are some moderate upstream lesions in the OM1 and D1, just upstream of  the stents.  Otherwise he has known occluded distal circumflex and RCA.  Admitted 05/08/22 with VT storm, in setting of an acute abdomen, this stay had a hemicolectomy (ultimately a cancer diagnosis), mexiletine was restarted this admission to be discharged on amiodarone and mexiletine  September admitted with hypoxic respiratory failure associated with PNA and suffered a PEA arrest, also had VT this admission. His mexiletine was stopped this admission, I am not entirely clear why, though historically has had GI intolerances with it  He saw Dr. Caryl Comes 12/03/22, discussed perhaps reducing amiodarone in the coming months from '400mg'$  daily   Our device clinic received an alert today of ongoing VT that had exhausted therapies in the zone.  He arrived in SR, PVCs  LABS K+ 3.4 Mag 1.7 BUN/Creat 27/1.22 AST 193 ALT 221 BNP 150 HS Trop 18, 19 WBC 3.5 H/H 16/47 Plts 221  He reports no appreciated symptoms, has been on Xeloda about a month for his cancer, this has made him a little more weak then usual, loose stools but not watery, and generally nauseous, no vomiting. He has not had any CP No syncope. He reports good medication compliance, but none this AM His only complaint now is back pain from the stretcher  Device interrogation ([facility])\presenting SR, PVCs Battery alnd lead numbers are good VP 0.2% OptiVol looks good 2 treated VT episode Total of 12 spins of ATP 1st episode  ATP scheme was successful 2ns episode was not after all 9 ATPs delivered No shocks programmed in this zone (136-182,    Past Medical History:  Diagnosis Date   AICD (automatic cardioverter/defibrillator) present 2005   CAD (coronary artery disease) 12/01/2013   Cardiac arrest [facility])    Chronic combined systolic and diastolic CHF, NYHA class 1 ([place]) 12/01/2013   Erectile dysfunction 12/01/2013   HTN (hypertension) 12/01/2013   Hyperlipidemia 12/01/2013   Ischemic cardiomyopathy 12/01/2013   Presence of  permanent cardiac pacemaker    Sleep apnea     Past Surgical History:  Procedure Laterality Date   ACHILLES TENDON REPAIR     lft foot   BIOPSY  04/16/2022   Procedure: BIOPSY;  Surgeon: Yetta Flock, MD;  Location: [place];  Service: Gastroenterology;;   CARDIAC CATHETERIZATION N/A 05/05/2016   Procedure: Left Heart Cath and Coronary Angiography;  Surgeon: Leonie Man, MD;  Location: [place] CV LAB;  Service: Cardiovascular;  Laterality: N/A;   CARDIAC DEFIBRILLATOR PLACEMENT     CARDIOVERSION N/A 09/13/2020   Procedure: CARDIOVERSION;  Surgeon: Werner Lean, MD;  Location: [place] ENDOSCOPY;  Service: Cardiovascular;  Laterality: N/A;   CARDIOVERSION N/A 09/19/2020   Procedure: CARDIOVERSION;  Surgeon: [name], MD;  Location: [facility] ENDOSCOPY;  Service: Cardiovascular;  Laterality: N/A;   COLON RESECTION N/A 04/18/2022   Procedure: HAND ASSISTED LAPAROSCOPIC RIGHT COLON RESECTION;  Surgeon: Felicie Morn, MD;  Location: [place];  Service: General;  Laterality: N/A;   COLONOSCOPY WITH PROPOFOL N/A 04/16/2022   Procedure: COLONOSCOPY WITH PROPOFOL;  Surgeon: Yetta Flock, MD;  Location: [place];  Service: Gastroenterology;  Laterality: N/A;   EP IMPLANTABLE DEVICE N/A 12/19/2016   Procedure: ICD Generator Changeout;  Surgeon: [name], MD;  Location: [place] CV LAB;  Service: Cardiovascular;  Laterality: N/A;   ESOPHAGOGASTRODUODENOSCOPY N/A 04/16/2022   Procedure: ESOPHAGOGASTRODUODENOSCOPY (EGD);  Surgeon: Yetta Flock, MD;  Location: [facility] ENDOSCOPY;  Service: Gastroenterology;  Laterality: N/A;   IR ANGIOGRAM SELECTIVE EACH ADDITIONAL VESSEL  05/09/2022   IR ANGIOGRAM VISCERAL SELECTIVE  05/09/2022   IR CATHETER TUBE CHANGE  05/14/2022   IR CATHETER TUBE CHANGE  06/27/2022   IR EMBO ART  VEN HEMORR LYMPH EXTRAV  INC GUIDE ROADMAPPING  05/09/2022   IR IMAGE GUIDED DRAINAGE PERCUT CATH  PERITONEAL RETROPERIT  05/09/2022   IR PATIENT EVAL  TECH 0-60 MINS  06/11/2022   IR RADIOLOGIST EVAL & MGMT  06/26/2022   IR RADIOLOGIST EVAL & MGMT  07/16/2022   IR THORACENTESIS ASP PLEURAL SPACE W/IMG GUIDE  09/23/2022   IR US GUIDE VASC ACCESS RIGHT  05/09/2022   LEFT HEART CATH AND CORONARY ANGIOGRAPHY N/A 04/17/2022   Procedure: LEFT HEART CATH AND CORONARY ANGIOGRAPHY;  Surgeon: Belva Crome, MD;  Location: [place] CV LAB;  Service: Cardiovascular;  Laterality: N/A;   POLYPECTOMY  04/16/2022   Procedure: POLYPECTOMY;  Surgeon: Yetta Flock, MD;  Location: [facility] ENDOSCOPY;  Service: Gastroenterology;;   RIGHT/LEFT HEART CATH AND CORONARY ANGIOGRAPHY N/A 09/24/2020   Procedure: RIGHT/LEFT HEART CATH AND CORONARY ANGIOGRAPHY;  Surgeon: Lorretta Harp, MD;  Location: [place] CV LAB;  Service: Cardiovascular;  Laterality: N/A;   SUBMUCOSAL TATTOO INJECTION  04/16/2022   Procedure: SUBMUCOSAL TATTOO INJECTION;  Surgeon: Yetta Flock, MD;  Location: [facility] ENDOSCOPY;  Service: Gastroenterology;;   WRIST TENODESIS       Medications Prior to Admission: Prior to Admission medications  Medication Sig Start Date End Date Taking? Authorizing Provider  acetaminophen (TYLENOL) 325 MG tablet Take 1-2 tablets (325-650 mg total) by mouth every 4 (four) hours as needed for mild pain. 06/06/22   Setzer, Edman Circle, PA-C  ALPRAZolam Duanne Moron) 0.25 MG tablet Take 1 tablet (0.25 mg total) by mouth at bedtime as needed for anxiety or sleep. 12/22/22   Heilingoetter, Cassandra L, PA-C  amiodarone (PACERONE) 200 MG tablet Take 2 tablets (400 mg total) by mouth daily. Please keep scheduled appointment for future refills. Thank you. 12/18/22   [name], MD  apixaban (ELIQUIS) 5 MG TABS tablet Take 1 tablet (5 mg total) by mouth 2 (two) times daily. 10/22/22   Croitoru, [name], MD  atorvastatin (LIPITOR) 80 MG tablet Take 1 tablet (80 mg total) by mouth every evening. Please schedule appointment for additional refills. 10/22/22   Croitoru, [name], MD  calcium  carbonate (TUMS - DOSED IN MG ELEMENTAL CALCIUM) 500 MG chewable tablet Chew 1 tablet by mouth daily.    [provider]  capecitabine (XELODA) 500 MG tablet Take 4 tabs every 12 hours for 14 days then off for 7 days, take after a meal 10/17/22   [name], MD  clopidogrel (PLAVIX) 75 MG tablet Take 1 tablet (75 mg total) by mouth daily. Please schedule appointment for additional refills. 10/22/22   Croitoru, [name], MD  dapagliflozin propanediol (FARXIGA) 10 MG TABS tablet Take 1 tablet (10 mg total) by mouth daily. Please contact the office to schedule appointment for additional refills. 10/22/22   Croitoru, [name], MD  diclofenac Sodium (VOLTAREN) 1 % GEL Apply 2 g topically 4 (four) times daily. Patient taking differently: Apply 2 g topically daily as needed (For pain). 06/06/22   Setzer, Edman Circle, PA-C  ezetimibe (ZETIA) 10 MG tablet Take 1 tablet (10 mg total) by mouth daily. Please contact the office to schedule appointment for additional refills. 10/22/22 01/20/23  Croitoru, [name], MD  furosemide (LASIX) 40 MG tablet Take 1 tablet (40 mg total) by mouth daily. 10/17/22   [name], MD  levETIRAcetam (KEPPRA) 750 MG tablet Take 2 tablets twice a day 10/17/22   [name], MD  levothyroxine (SYNTHROID) 50 MCG tablet Take 1 tablet (50 mcg total) by mouth daily at 6 (six) AM. 12/03/22   [name], MD  metoprolol succinate (TOPROL-XL) 50 MG 24 hr tablet Take 1 tablet (50 mg total) by mouth 2 (two) times daily. Take with or immediately following a meal. Please schedule appointment for additional refills. 10/22/22   Croitoru, [name], MD  naloxone [facility]) nasal spray 4 mg/0.1 mL Use as needed in case of overdose 09/18/22   Love, Ivan Anchors, PA-C  polyethylene glycol (MIRALAX / GLYCOLAX) 17 g packet Take 17 g by mouth daily as needed for mild constipation. 09/26/22   Ghimire, Henreitta Leber, MD  potassium chloride (KLOR-CON M) 10 MEQ tablet Take 1 tablet (10 mEq total) by mouth daily. 10/17/22    [name], MD  sacubitril-valsartan (ENTRESTO) 49-51 MG Take 1 tablet by mouth 2 (two) times daily. Please contact the office to schedule appointment for additional refills. 10/22/22   Croitoru, [name], MD  traZODone (DESYREL) 50 MG tablet Take 1 tablet (50 mg total) by mouth at bedtime. 10/17/22   [name], MD     Allergies:   No Known Allergies  Social History:   Social History   Socioeconomic History   Marital status: Single    Spouse name: Not  on file   Number of children: Not on file   Years of education: Not on file   Highest education level: Not on file  Occupational History   Not on file  Tobacco Use   Smoking status: Former    Packs/day: 1.00    Years: 30.00    Total pack years: 30.00    Types: Cigarettes    Quit date: 05/31/2013    Years since quitting: 9.6   Smokeless tobacco: Never  Vaping Use   Vaping Use: Never used  Substance and Sexual Activity   Alcohol use: Not Currently    Comment: 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    Right handed   Social Determinants of Health   Financial Resource Strain: High Risk (05/06/2022)   Overall Financial Resource Strain (CARDIA)    Difficulty of Paying Living Expenses: Hard  Food Insecurity: Not on file  Transportation Needs: Not on file  Physical Activity: Not on file  Stress: Not on file  Social Connections: Not on file  Intimate Partner Violence: Not on file    Family History:   The patient's family history includes Brain cancer in his father; Heart disease in his mother; Hypertension in his daughter.    ROS:  Please see the history of present illness.  All other ROS reviewed and negative.     Physical Exam/Data:    Vitals:   12/16/2022 1050 12/23/2022 1140 12/25/2022 1200 01/10/2023 1300  BP: 90/73 1'01/76 95/74 97/74 '$  Pulse: 75 76 74 71  Resp: '16 19 15 18  '$ Temp:      TempSrc:      SpO2: 94% 96% 93% 96%  Weight:      Height:        Intake/Output Summary (Last 24 hours) at 01/08/2023 1319 Last data filed at 12/19/2022 1150 Gross per 24 hour  Intake 500 ml  Output --  Net 500 ml      12/17/2022    9:12 AM 12/22/2022   11:30 AM 12/03/2022    2:27 PM  Last 3 Weights  Weight (lbs) 220 lb 216 lb 3.2 oz 214 lb 9.6 oz  Weight (kg) 99.791 kg 98.068 kg 97.342 kg     Body mass index is 25.42 kg/m.  General:  Well nourished, well developed, in no acute distress HEENT: normal Neck: no JVD Vascular: No carotid bruits; Distal pulses 2+ bilaterally   Cardiac:  RRR; no murmurs, gallops or rubs Lungs:  CTA b/l, no wheezing, rhonchi or rales  Abd: soft, nontender, no hepatomegaly  Ext: no edema Musculoskeletal:  No deformities Skin: warm and dry  Neuro:  no focal abnormalities noted Psych:  Normal affect    EKG:  The ECG that was done today was personally reviewed and demonstrates SR, V trigeminy 85bpm, no ischemic looking changes  Relevant CV Studies:  04/17/22: [facility] CONCLUSIONS: Widely patent left main Mid segment of LAD contains 40 to 50% narrowing.  The large first diagonal contains a patent stent in the mid body.  Proximal to the stent is a 50% stenosis. Circumflex is totally occluded beyond the origin of the large first obtuse marginal.  Left-to-right  collaterals are noted.  The proximal circumflex before the origin of the first obtuse marginal contains 50 to 70% stenosis.  There is progression of this lesion compared to prior. Right coronary is totally occluded proximally.  It reconstitutes in the distal segment.  PDA is totally occluded.  PDA and left ventricular branches fill via left to right collaterals. Recommendations: Per managing team. Anatomy is similar to prior with the following  exceptions: 50% to 70% stenosis proximal to the large first obtuse marginal.  40 to 50% stenosis proximal to the stent segment in the first diagonal.     Echocardiogram 04/07/2022: Impressions:  1. Difficult acoustic windows Endocardium not well seen Overall there  does not appear to be a signfiicant change from echo in 2021.   2. LV function is depressed with hypokinesis of the lateral wall,  mid/distal inferior wall and apex; akinesis with aneurysmal dilitation of  inferior base. . Left ventricular ejection fraction, by estimation, is 35  to 40%. The left ventricle has  moderately decreased function. The left ventricular internal cavity size  was moderately dilated. There is mild left ventricular hypertrophy. Left  ventricular diastolic parameters are indeterminate.   3. Right ventricular systolic function is normal. The right ventricular  size is normal. There is mildly elevated pulmonary artery systolic  pressure.   4. Left atrial size was severely dilated.   5. Right atrial size was mildly dilated.   6. The mitral valve is normal in structure. Mild mitral valve  regurgitation.   7. The aortic valve is tricuspid. Aortic valve regurgitation is not  visualized. Aortic valve sclerosis/calcification is present, without any  evidence of aortic stenosis.   8. There is borderline dilatation of the aortic root, measuring 39 mm.  There is borderline dilatation of the ascending aorta, measuring 39 mm.   Laboratory Data:  High Sensitivity Troponin:   Recent Labs  Lab 12/18/2022 0956 12/18/2022 1205  TROPONINIHS 18* 19*      Chemistry Recent Labs  Lab 12/21/2022 0956  NA 136  K 3.4*  CL 109  CO2 17*  GLUCOSE 125*  BUN 27*  CREATININE 1.22  CALCIUM 7.6*  MG 1.7  GFRNONAA >60  ANIONGAP 10    Recent Labs  Lab 01/02/2023 0956  PROT 5.2*  ALBUMIN 2.9*  AST 193*  ALT 221*  ALKPHOS 68  BILITOT 1.0   Lipids No results for input(s): "CHOL", "TRIG", "HDL", "LABVLDL", "LDLCALC",  "CHOLHDL" in the last 168 hours. Hematology Recent Labs  Lab 12/23/2022 0956  WBC 3.5*  RBC 4.64  HGB 16.4  HCT 47.1  MCV 101.5*  MCH 35.3*  MCHC 34.8  RDW 22.9*  PLT 221   Thyroid No results for input(s): "TSH", "FREET4" in the last 168 hours. BNP Recent Labs  Lab 01/06/2023 0956  BNP 150.5*    DDimer No results for input(s): "DDIMER" in the last 168 hours.   Radiology/Studies:  DG Chest Port 1 View  Result Date: 12/23/2022 CLINICAL DATA:  Pacer fired EXAM: PORTABLE CHEST 1 VIEW COMPARISON:  09/23/2022 FINDINGS: Left base subsegmental atelectasis or consolidation and small left-sided effusion are stable findings. Aorta is tortuous and calcified. No pneumothorax. Normal pulmonary vasculature. Left-sided pacemaker. IMPRESSION: Left base subsegmental atelectasis or scarring and minimal left-sided pleural effusion. Otherwise no acute cardiopulmonary process. Electronically Signed   By: Sammie Bench M.D.   On: 12/18/2022 10:10     Assessment and Plan:   VT storm VT rates remains in zone one, ATP scheme did work  initially though was exhausted on his zone therapies He arrives in SR PVCs internittently frequent with some couplets and 3 beat salvos He is chronically on amiodarone Elevated LFTs (28% incidence of LFT elevations w/Xeloda)    CAD No anginal symptoms HS Trops unremarkable Cath may last year with stable CAD, no interventions (cath noted above)  ICM Chronic CHF Not volume OL by exam, CXR or OptiVol  Paroxysmal Afib CHA2DS2Vasc is 4, on Eliquis  I anticipate at least observation admission Amiodarone gtt for now and supplement mag and K+ Khamia Stambaugh revisit mexiletine, though he is already having GI symptoms with his chemo   EP MD Lane Kjos see    Risk Assessment/Risk Scores:    For questions or updates, please contact [place] Please consult [email or web address] for contact info under     Signed, Baldwin Jamaica, PA-C  12/21/2022 1:19 PM    I have  seen and examined this patient with Tommye Standard.  Agree with above, note added to reflect my findings.  Patient presented to the emergency room after being called by device clinic and told that he was in slow VT.  On admission to the emergency room, he was back in sinus rhythm.  He has a history of ischemic cardiomyopathy and ventricular tachycardia.  He is currently on amiodarone.  He currently feels well.  He is at his normal state of health.  In the emergency room, he was found to be mildly hypokalemic with a potassium of 3.4.  GEN: Well nourished, well developed, in no acute distress  HEENT: normal  Neck: no JVD, carotid bruits, or masses Cardiac: RRR; no murmurs, rubs, or gallops,no edema  Respiratory:  clear to auscultation bilaterally, normal work of breathing GI: soft, nontender, nondistended, + BS MS: no deformity or atrophy  Skin: warm and dry, device site well healed Neuro:  Strength and sensation are intact Psych: euthymic mood, full affect   VT storm: Has continued to have VT, though quite slow.  He was minimally symptomatic.  He did receive ATP, but no ICD shocks as there are no programmed in the current zone.  He is now in sinus rhythm.  Sephiroth Mcluckie plan for IV amiodarone for the next 24 hours.  Moniqua Engebretsen likely switch him to p.o. amiodarone thereafter.  This is potentially due to hypokalemia although mild.  Darianny Momon plan for supplementation. Coronary artery disease: No current angina. Chronic systolic heart failure: No obvious volume overload by exam, chest x-ray, or OptiVol.  Continue home medications. Paroxysmal atrial fibrillation: Continue Eliquis.  In sinus rhythm.  Waymon Laser M. Divante Kotch MD 01/03/2023 4:42 PM

## 2023-01-07 NOTE — ED Triage Notes (Signed)
Pt came in POV d/t his device clinic stating that he needs to go in to the Emergency dept since his defibrillator was detected to have fired. Pt denies feeling anything when it was detected but upon arrival to ED he does report feeling "weird." Endorses feeling dizzy, lightheaded, nausea, denies any chest palpitations or pain. Informes this RN he has had this device since 2006. A/Ox4.

## 2023-01-07 NOTE — ED Notes (Signed)
Shift report received, assumed care of patient at this time 

## 2023-01-07 NOTE — ED Notes (Signed)
Provided pt pillow

## 2023-01-08 DIAGNOSIS — I255 Ischemic cardiomyopathy: Secondary | ICD-10-CM

## 2023-01-08 DIAGNOSIS — C182 Malignant neoplasm of ascending colon: Secondary | ICD-10-CM

## 2023-01-08 DIAGNOSIS — I472 Ventricular tachycardia, unspecified: Secondary | ICD-10-CM | POA: Diagnosis not present

## 2023-01-08 DIAGNOSIS — I2510 Atherosclerotic heart disease of native coronary artery without angina pectoris: Secondary | ICD-10-CM | POA: Diagnosis not present

## 2023-01-08 DIAGNOSIS — C787 Secondary malignant neoplasm of liver and intrahepatic bile duct: Secondary | ICD-10-CM

## 2023-01-08 DIAGNOSIS — Z79899 Other long term (current) drug therapy: Secondary | ICD-10-CM

## 2023-01-08 DIAGNOSIS — I480 Paroxysmal atrial fibrillation: Secondary | ICD-10-CM

## 2023-01-08 LAB — COMPREHENSIVE METABOLIC PANEL
ALT: 280 U/L — ABNORMAL HIGH (ref 0–44)
AST: 201 U/L — ABNORMAL HIGH (ref 15–41)
Albumin: 2.8 g/dL — ABNORMAL LOW (ref 3.5–5.0)
Alkaline Phosphatase: 65 U/L (ref 38–126)
Anion gap: 8 (ref 5–15)
BUN: 28 mg/dL — ABNORMAL HIGH (ref 8–23)
CO2: 20 mmol/L — ABNORMAL LOW (ref 22–32)
Calcium: 7.7 mg/dL — ABNORMAL LOW (ref 8.9–10.3)
Chloride: 107 mmol/L (ref 98–111)
Creatinine, Ser: 1.19 mg/dL (ref 0.61–1.24)
GFR, Estimated: >60 mL/min (ref >60)
Glucose, Bld: 145 mg/dL — ABNORMAL HIGH (ref 70–99)
Potassium: 3.4 mmol/L — ABNORMAL LOW (ref 3.5–5.1)
Sodium: 135 mmol/L (ref 135–145)
Total Bilirubin: 0.9 mg/dL (ref 0.3–1.2)
Total Protein: 5.1 g/dL — ABNORMAL LOW (ref 6.5–8.1)

## 2023-01-08 LAB — MAGNESIUM: Magnesium: 2.5 mg/dL — ABNORMAL HIGH (ref 1.7–2.4)

## 2023-01-08 LAB — C DIFFICILE QUICK SCREEN W PCR REFLEX
C Diff antigen: NEGATIVE
C Diff interpretation: NOT DETECTED
C Diff toxin: NEGATIVE

## 2023-01-08 MED ORDER — POTASSIUM CHLORIDE CRYS ER 20 MEQ PO TBCR
40.0000 meq | EXTENDED_RELEASE_TABLET | ORAL | Status: AC
  Administered 2023-01-08 (×2): 40 meq via ORAL
  Filled 2023-01-08 (×2): qty 2

## 2023-01-08 MED ORDER — SPIRONOLACTONE 12.5 MG HALF TABLET
12.5000 mg | ORAL_TABLET | Freq: Every day | ORAL | Status: DC
  Administered 2023-01-08: 12.5 mg via ORAL
  Filled 2023-01-08 (×2): qty 1

## 2023-01-08 NOTE — Progress Notes (Signed)
Gregory [name]   DOB:1961-02-09   SN#:[id number]   LPF#:[id number]  Hem/onc follow up   Subjective: Patient is well-known to me, under my care for his colon cancer treatment.  He was admitted for arrhythmia.  He has been feeling more fatigued, had intermittent nausea, low appetite lately.  He finished last cycle Xeloda last week.  Daughter at the bedside.   Objective:  Vitals:   01/08/23 1608 01/08/23 1959  BP: 119/70 98/67  Pulse: 80 75  Resp: 17 18  Temp: 98 F (36.7 C) (!) 97.3 F (36.3 C)  SpO2: 98% 96%    Body mass index is 25.42 kg/m.  Intake/Output Summary (Last 24 hours) at 01/08/2023 2231 Last data filed at 01/08/2023 1500 Gross per 24 hour  Intake 536 ml  Output 2 ml  Net 534 ml     Sclerae unicteric  MSK no focal spinal tenderness, no peripheral edema  Neuro nonfocal    CBG (last 3)  No results for input(s): "GLUCAP" in the last 72 hours.   Labs:   Urine Studies No results for input(s): "UHGB", "CRYS" in the last 72 hours.  Invalid input(s): "UACOL", "UAPR", "USPG", "UPH", "UTP", "UGL", "UKET", "UBIL", "UNIT", "UROB", "ULEU", "UEPI", "UWBC", "URBC", "UBAC", "CAST", "UCOM", "BILUA"  Basic Metabolic Panel: Recent Labs  Lab 12/26/2022 0956 01/08/23 0429 01/08/23 0645  NA 136 135  --   K 3.4* 3.4*  --   CL 109 107  --   CO2 17* 20*  --   GLUCOSE 125* 145*  --   BUN 27* 28*  --   CREATININE 1.22 1.19  --   CALCIUM 7.6* 7.7*  --   MG 1.7  --  2.5*   GFR Estimated Creatinine Clearance: 81.1 mL/min (by C-G formula based on SCr of 1.19 mg/dL). Liver Function Tests: Recent Labs  Lab 01/14/2023 0956 01/08/23 0429  AST 193* 201*  ALT 221* 280*  ALKPHOS 68 65  BILITOT 1.0 0.9  PROT 5.2* 5.1*  ALBUMIN 2.9* 2.8*   No results for input(s): "LIPASE", "AMYLASE" in the last 168 hours. No results for input(s): "AMMONIA" in the last 168 hours. Coagulation profile No results for input(s): "INR", "PROTIME" in the last 168 hours.  CBC: Recent Labs   Lab 01/03/2023 0956  WBC 3.5*  NEUTROABS 1.8  HGB 16.4  HCT 47.1  MCV 101.5*  PLT 221   Cardiac Enzymes: No results for input(s): "CKTOTAL", "CKMB", "CKMBINDEX", "TROPONINI" in the last 168 hours. BNP: Invalid input(s): "POCBNP" CBG: No results for input(s): "GLUCAP" in the last 168 hours. D-Dimer No results for input(s): "DDIMER" in the last 72 hours. Hgb A1c No results for input(s): "HGBA1C" in the last 72 hours. Lipid Profile No results for input(s): "CHOL", "HDL", "LDLCALC", "TRIG", "CHOLHDL", "LDLDIRECT" in the last 72 hours. Thyroid function studies No results for input(s): "TSH", "T4TOTAL", "T3FREE", "THYROIDAB" in the last 72 hours.  Invalid input(s): "FREET3" Anemia work up No results for input(s): "VITAMINB12", "FOLATE", "FERRITIN", "TIBC", "IRON", "RETICCTPCT" in the last 72 hours. Microbiology Recent Results (from the past 240 hour(s))  Resp panel by RT-PCR (RSV, Flu A&B, Covid) Anterior Nasal Swab     Status: None   Collection Time: 01/08/2023  9:38 AM   Specimen: Anterior Nasal Swab  Result Value Ref Range Status   SARS Coronavirus 2 by RT PCR NEGATIVE NEGATIVE Final    Comment: (NOTE) SARS-CoV-2 target nucleic acids are NOT DETECTED.  The SARS-CoV-2 RNA is generally detectable in upper respiratory specimens during the  acute phase of infection. The lowest concentration of SARS-CoV-2 viral copies this assay can detect is 138 copies/mL. A negative result does not preclude SARS-Cov-2 infection and should not be used as the sole basis for treatment or other patient management decisions. A negative result may occur with  improper specimen collection/handling, submission of specimen other than nasopharyngeal swab, presence of viral mutation(s) within the areas targeted by this assay, and inadequate number of viral copies(<138 copies/mL). A negative result must be combined with clinical observations, patient history, and epidemiological information. The expected  result is Negative.  Fact Sheet for Patients:  EntrepreneurPulse.com.au  Fact Sheet for Healthcare Providers:  IncredibleEmployment.be  This test is no t yet approved or cleared by the [place] FDA and  has been authorized for detection and/or diagnosis of SARS-CoV-2 by FDA under an Emergency Use Authorization (EUA). This EUA will remain  in effect (meaning this test can be used) for the duration of the COVID-19 declaration under Section 564(b)(1) of the Act, 21 U.S.C.section 360bbb-3(b)(1), unless the authorization is terminated  or revoked sooner.       Influenza A by PCR NEGATIVE NEGATIVE Final   Influenza B by PCR NEGATIVE NEGATIVE Final    Comment: (NOTE) The Xpert Xpress SARS-CoV-2/FLU/RSV plus assay is intended as an aid in the diagnosis of influenza from Nasopharyngeal swab specimens and should not be used as a sole basis for treatment. Nasal washings and aspirates are unacceptable for Xpert Xpress SARS-CoV-2/FLU/RSV testing.  Fact Sheet for Patients: EntrepreneurPulse.com.au  Fact Sheet for Healthcare Providers: IncredibleEmployment.be  This test is not yet approved or cleared by the [place] FDA and has been authorized for detection and/or diagnosis of SARS-CoV-2 by FDA under an Emergency Use Authorization (EUA). This EUA will remain in effect (meaning this test can be used) for the duration of the COVID-19 declaration under Section 564(b)(1) of the Act, 21 U.S.C. section 360bbb-3(b)(1), unless the authorization is terminated or revoked.     Resp Syncytial Virus by PCR NEGATIVE NEGATIVE Final    Comment: (NOTE) Fact Sheet for Patients: EntrepreneurPulse.com.au  Fact Sheet for Healthcare Providers: IncredibleEmployment.be  This test is not yet approved or cleared by the [place] FDA and has been authorized for detection and/or diagnosis of  SARS-CoV-2 by FDA under an Emergency Use Authorization (EUA). This EUA will remain in effect (meaning this test can be used) for the duration of the COVID-19 declaration under Section 564(b)(1) of the Act, 21 U.S.C. section 360bbb-3(b)(1), unless the authorization is terminated or revoked.  Performed at [facility], [place] [address] [place], [place] [address]       Studies:  DG Chest Port 1 View  Result Date: 12/28/2022 CLINICAL DATA:  Pacer fired EXAM: PORTABLE CHEST 1 VIEW COMPARISON:  09/23/2022 FINDINGS: Left base subsegmental atelectasis or consolidation and small left-sided effusion are stable findings. Aorta is tortuous and calcified. No pneumothorax. Normal pulmonary vasculature. Left-sided pacemaker. IMPRESSION: Left base subsegmental atelectasis or scarring and minimal left-sided pleural effusion. Otherwise no acute cardiopulmonary process. Electronically Signed   By: Sammie Bench M.D.   On: 12/20/2022 10:10    Assessment: 62 y.o. male   VT CAD ICM PAF Metastatic colon cancer, on palliative chemotherapy Xarelto   Plan:  -Due to his persistent nausea, fatigue, and potential cardiotoxicity from Xeloda, I recommend holding Xeloda for now. -will schedule his restaging CT scan as soon as possible, his daughter will call radiology to schedule -I will see him back after his CT scan,  will likely consider changing cancer treatment.    [name], MD 01/08/2023  10:31 PM

## 2023-01-08 NOTE — ED Notes (Signed)
Family updated as to patient's status. Family at bedside visiting pt

## 2023-01-08 NOTE — Progress Notes (Addendum)
Rounding Note    Patient Name: Gregory [name] Date of Encounter: 01/08/2023  [place] Cardiologist: Sanda Klein, MD  Dr. Caryl Comes  Subjective   No CP, SOB  Inpatient Medications    Scheduled Meds:  apixaban  5 mg Oral BID   atorvastatin  80 mg Oral QPM   calcium carbonate  1 tablet Oral Daily   clopidogrel  75 mg Oral Daily   dapagliflozin propanediol  10 mg Oral Daily   ezetimibe  10 mg Oral Daily   furosemide  40 mg Oral Daily   levETIRAcetam  1,500 mg Oral BID   levothyroxine  50 mcg Oral Q0600   metoprolol succinate  50 mg Oral BID   potassium chloride  40 mEq Oral Q4H   sacubitril-valsartan  1 tablet Oral BID   spironolactone  12.5 mg Oral Daily   traZODone  50 mg Oral QHS   Continuous Infusions:  amiodarone 30 mg/hr (01/08/23 0206)   PRN Meds: acetaminophen, ALPRAZolam, diclofenac Sodium, nitroGLYCERIN, polyethylene glycol   Vital Signs    Vitals:   01/08/23 0507 01/08/23 0530 01/08/23 0708 01/08/23 0730  BP:  106/75 102/75 92/71  Pulse:  68  69  Resp:  10  16  Temp: (!) 97.5 F (36.4 C)  98.6 F (37 C)   TempSrc: Temporal  Axillary   SpO2:  99% 99% 96%  Weight:      Height:        Intake/Output Summary (Last 24 hours) at 01/08/2023 0925 Last data filed at 01/08/2023 0506 Gross per 24 hour  Intake 500 ml  Output 2 ml  Net 498 ml      01/08/2023    9:12 AM 12/22/2022   11:30 AM 12/03/2022    2:27 PM  Last 3 Weights  Weight (lbs) 220 lb 216 lb 3.2 oz 214 lb 9.6 oz  Weight (kg) 99.791 kg 98.068 kg 97.342 kg      Telemetry    SR, occ-frequent PVCs, occ couplets, rare short NSVT 3beats - Personally Reviewed  ECG    No new EKGs - Personally Reviewed  Physical Exam   GEN: No acute distress, chronically ill appearing.   Neck: No JVD Cardiac: RRR, no murmurs, rubs, or gallops.  Respiratory: CTA b/l. GI: Soft, nontender, non-distended  MS: No edema; No deformity. Neuro:  Nonfocal  Psych: Normal affect   Labs     High Sensitivity Troponin:   Recent Labs  Lab 01/08/2023 0956 01/04/2023 1205  TROPONINIHS 18* 19*     Chemistry Recent Labs  Lab 12/20/2022 0956 01/08/23 0429  NA 136 135  K 3.4* 3.4*  CL 109 107  CO2 17* 20*  GLUCOSE 125* 145*  BUN 27* 28*  CREATININE 1.22 1.19  CALCIUM 7.6* 7.7*  MG 1.7  --   PROT 5.2* 5.1*  ALBUMIN 2.9* 2.8*  AST 193* 201*  ALT 221* 280*  ALKPHOS 68 65  BILITOT 1.0 0.9  GFRNONAA >60 >60  ANIONGAP 10 8    Lipids No results for input(s): "CHOL", "TRIG", "HDL", "LABVLDL", "LDLCALC", "CHOLHDL" in the last 168 hours.  Hematology Recent Labs  Lab 01/11/2023 0956  WBC 3.5*  RBC 4.64  HGB 16.4  HCT 47.1  MCV 101.5*  MCH 35.3*  MCHC 34.8  RDW 22.9*  PLT 221   Thyroid No results for input(s): "TSH", "FREET4" in the last 168 hours.  BNP Recent Labs  Lab 12/18/2022 0956  BNP 150.5*    DDimer No results  for input(s): "DDIMER" in the last 168 hours.   Radiology    DG Chest Port 1 View  Result Date: 12/20/2022 CLINICAL DATA:  Pacer fired EXAM: PORTABLE CHEST 1 VIEW COMPARISON:  09/23/2022 FINDINGS: Left base subsegmental atelectasis or consolidation and small left-sided effusion are stable findings. Aorta is tortuous and calcified. No pneumothorax. Normal pulmonary vasculature. Left-sided pacemaker. IMPRESSION: Left base subsegmental atelectasis or scarring and minimal left-sided pleural effusion. Otherwise no acute cardiopulmonary process. Electronically Signed   By: Sammie Bench M.D.   On: 12/28/2022 10:10    Cardiac Studies   04/17/22: [facility] CONCLUSIONS: Widely patent left main Mid segment of LAD contains 40 to 50% narrowing.  The large first diagonal contains a patent stent in the mid body.  Proximal to the stent is a 50% stenosis. Circumflex is totally occluded beyond the origin of the large first obtuse marginal.  Left-to-right collaterals are noted.  The proximal circumflex before the origin of the first obtuse marginal contains 50 to 70%  stenosis.  There is progression of this lesion compared to prior. Right coronary is totally occluded proximally.  It reconstitutes in the distal segment.  PDA is totally occluded.  PDA and left ventricular branches fill via left to right collaterals. Recommendations: Per managing team. Anatomy is similar to prior with the following exceptions: 50% to 70% stenosis proximal to the large first obtuse marginal.  40 to 50% stenosis proximal to the stent segment in the first diagonal.     Echocardiogram 04/07/2022: Impressions:  1. Difficult acoustic windows Endocardium not well seen Overall there  does not appear to be a signfiicant change from echo in 2021.   2. LV function is depressed with hypokinesis of the lateral wall,  mid/distal inferior wall and apex; akinesis with aneurysmal dilitation of  inferior base. . Left ventricular ejection fraction, by estimation, is 35  to 40%. The left ventricle has  moderately decreased function. The left ventricular internal cavity size  was moderately dilated. There is mild left ventricular hypertrophy. Left  ventricular diastolic parameters are indeterminate.   3. Right ventricular systolic function is normal. The right ventricular  size is normal. There is mildly elevated pulmonary artery systolic  pressure.   4. Left atrial size was severely dilated.   5. Right atrial size was mildly dilated.   6. The mitral valve is normal in structure. Mild mitral valve  regurgitation.   7. The aortic valve is tricuspid. Aortic valve regurgitation is not  visualized. Aortic valve sclerosis/calcification is present, without any  evidence of aortic stenosis.   8. There is borderline dilatation of the aortic root, measuring 39 mm.  There is borderline dilatation of the ascending aorta, measuring 39 mm.   Patient Profile     62 y.o. male with CAD (known chronic occl of RCA with prior PCIs to LAD, diag, and LCx), VT, ICM,  OSA w/CPAP, seizures, chronic CHF, HLD,  HTN, obesity, AFib, h/o VT, PVCs admitted with VT storm  Device information Medtronic Dual Chamber ICD implanted 2005, 2010 and gen change for 2018   2017 to have slow VT prompting reduction of his VT zone rate to 150bpm.   Oct 2021, VT with appropriate therapies   He had stopped drinking (heavily at least).  He noted previously had been on amiodarone and mexiletine , the mexiletine stopped 2/2 GI symptoms, and the amiodarone stopped 2/2 abn ALk phos   Oct 2021 > amio restarted for recurrent VT   Device interrogation ([facility])\presenting SR, PVCs  Battery alnd lead numbers are good VP 0.2% OptiVol looks good 2 treated VT episode Total of 12 spins of ATP 1st episode ATP scheme was successful 2ns episode was not after all 9 ATPs delivered No shocks programmed in this zone (136-182,   Assessment & Plan    VT storm VT rates remains in zone one, ATP scheme did work initially though was exhausted on his zone therapies He arrived in SR He is chronically on amiodarone Elevated LFTs (28% incidence of LFT elevations w/Xeloda)  No real other AAD option Historically had failed mexiletine with GI intolerances, he is now chronically nauseous with his chemotherapy and Talal Fritchman not try to re-add mexiletine.  Elevated LFTs, xeloda and amio can contribute Stable Perhaps more with the VT   Midl hypokalemia persists, given his hx of VT,known irritability of rhythm, mild hypokalemia may have been the trigger. Further replacement ordered Mareesa Gathright also add low dose spironolactone If no further VT through today, plan to transition to PO amiodarone this afternoon     CAD No anginal symptoms HS Trops unremarkable Cath may last year with stable CAD, no interventions (cath noted above)   ICM Chronic CHF Not volume OL by exam, CXR or OptiVol Volume status remains OK Jamol Ginyard add spironolactone to help keep his K= supplemented   Paroxysmal Afib CHA2DS2Vasc is 4, on Eliquis  6. Colon Ca He is not in  a xeloda cycle currently. Daughter at bedside today, has observed since starting chemo a clear steady functional decline, deteriorating appetite with persistent nausea, Concerns about his poor intake perhaps contributing to his flare up of VT (and hypokalemia) as well as some mild confusion that she has noticed at home intermittently as well Reports good medication compliance Dr. Curt Bears advised discussion with his oncologist who she has already reached out to I have consulted dietician for their insight/recommendations   For questions or updates, please contact [place] Please consult [email or web address] for contact info under        Signed, Baldwin [place], PA-C  01/08/2023, 9:25 AM    I have seen and examined this patient with Tommye Standard.  Agree with above, note added to reflect my findings.  Continuing to feel weak and fatigued, though no shortness of breath.  GEN: Well nourished, well developed, in no acute distress  HEENT: normal  Neck: no JVD, carotid bruits, or masses Cardiac: RRR; no murmurs, rubs, or gallops,no edema  Respiratory:  clear to auscultation bilaterally, normal work of breathing GI: soft, nontender, nondistended, + BS MS: no deformity or atrophy  Skin: warm and dry, device site well healed Neuro:  Strength and sensation are intact Psych: euthymic mood, full affect   Ventricular tachycardia storm: Currently on IV amiodarone.  Did have some VT this afternoon for approximately 60 seconds.  Patient asymptomatic.  Alok Minshall continue IV amiodarone throughout the night tonight.  If he remains VT free, [place] Grundman likely transition to p.o. amiodarone tomorrow. Chronic systolic heart failure: No evidence of volume overload.  If he continues to have VT, would consider low output heart failure and need for heart failure team to evaluate. Coronary artery disease: No current chest pain.  No indication for left heart catheterization Colon cancer: Patient has been on Xeloda.  Per  his daughter/caregiver he has been having decreased dietary intake.  Stephanye Finnicum ask nutrition for assistance.  Aracelli Woloszyn M. Mable Dara MD 01/08/2023 7:45 PM

## 2023-01-08 NOTE — Progress Notes (Signed)
Notified EP of patient sustaining vtach for roughly 60 seconds. Asymptotic. VSS. ICD did not shock. On amio gtt. Will continue to monitor.

## 2023-01-08 NOTE — ED Notes (Signed)
Got patient up to the bedside toilet patient is now back in bed on the monitor placed a clean pad on the bed got patient some warm blankets patient is resting with call bell in reach

## 2023-01-08 NOTE — ED Notes (Signed)
ED TO INPATIENT HANDOFF REPORT  ED Nurse Name and Phone #: Vincente Liberty [phone]  S Name/Age/Gender Gregory [name] 62 y.o. male Room/Bed: 040C/040C  Code Status   Code Status: Full Code  Home/SNF/Other Home Patient oriented to: self, place, and situation Is this baseline? Yes   Triage Complete: Triage complete  Chief Complaint VT (ventricular tachycardia) ([place]) [I47.20]  Triage Note Pt came in POV d/t his device clinic stating that he needs to go in to the Emergency dept since his defibrillator was detected to have fired. Pt denies feeling anything when it was detected but upon arrival to ED he does report feeling "weird." Endorses feeling dizzy, lightheaded, nausea, denies any chest palpitations or pain. Informes this RN he has had this device since 2006. A/Ox4.    Allergies No Known Allergies  Level of Care/Admitting Diagnosis ED Disposition     ED Disposition  Admit   Condition  --   [place]: [place] [[id number]]  Level of Care: Telemetry Cardiac [103]  May admit patient to [name] or [facility] if equivalent level of care is available:: No  Covid Evaluation: Asymptomatic - no recent exposure (last 10 days) testing not required  Diagnosis: VT (ventricular tachycardia) [facility]) [[id number]]  Admitting Physician: Constance Haw [phone]  Attending Physician: Constance Haw [[id number]]  Certification:: I certify this patient will need inpatient services for at least 2 midnights  Estimated Length of Stay: 3          B Medical/Surgery History Past Medical History:  Diagnosis Date   AICD (automatic cardioverter/defibrillator) present 2005   CAD (coronary artery disease) 12/01/2013   Cardiac arrest ([place])    Chronic combined systolic and diastolic CHF, NYHA class 1 ([place]) 12/01/2013   Erectile dysfunction 12/01/2013   HTN (hypertension) 12/01/2013   Hyperlipidemia 12/01/2013   Ischemic cardiomyopathy 12/01/2013    Presence of permanent cardiac pacemaker    Sleep apnea    Past Surgical History:  Procedure Laterality Date   ACHILLES TENDON REPAIR     lft foot   BIOPSY  04/16/2022   Procedure: BIOPSY;  Surgeon: Yetta Flock, MD;  Location: [place];  Service: Gastroenterology;;   CARDIAC CATHETERIZATION N/A 05/05/2016   Procedure: Left Heart Cath and Coronary Angiography;  Surgeon: Leonie Man, MD;  Location: [place] [facility];  Service: Cardiovascular;  Laterality: N/A;   CARDIAC DEFIBRILLATOR PLACEMENT     CARDIOVERSION N/A 09/13/2020   Procedure: CARDIOVERSION;  Surgeon: Werner Lean, MD;  Location: [place] ENDOSCOPY;  Service: Cardiovascular;  Laterality: N/A;   CARDIOVERSION N/A 09/19/2020   Procedure: CARDIOVERSION;  Surgeon: [name], MD;  Location: [facility] ENDOSCOPY;  Service: Cardiovascular;  Laterality: N/A;   COLON RESECTION N/A 04/18/2022   Procedure: HAND ASSISTED LAPAROSCOPIC RIGHT COLON RESECTION;  Surgeon: Felicie Morn, MD;  Location: [place];  Service: General;  Laterality: N/A;   COLONOSCOPY WITH PROPOFOL N/A 04/16/2022   Procedure: COLONOSCOPY WITH PROPOFOL;  Surgeon: Yetta Flock, MD;  Location: [place];  Service: Gastroenterology;  Laterality: N/A;   EP IMPLANTABLE DEVICE N/A 12/19/2016   Procedure: ICD Generator Changeout;  Surgeon: [name], MD;  Location: [place] [facility];  Service: Cardiovascular;  Laterality: N/A;   ESOPHAGOGASTRODUODENOSCOPY N/A 04/16/2022   Procedure: ESOPHAGOGASTRODUODENOSCOPY (EGD);  Surgeon: Yetta Flock, MD;  Location: [facility] ENDOSCOPY;  Service: Gastroenterology;  Laterality: N/A;   IR ANGIOGRAM SELECTIVE EACH ADDITIONAL VESSEL  05/09/2022   IR ANGIOGRAM VISCERAL SELECTIVE  05/09/2022   IR CATHETER TUBE CHANGE  05/14/2022   IR CATHETER TUBE CHANGE  06/27/2022   IR EMBO ART  VEN HEMORR LYMPH EXTRAV  INC GUIDE ROADMAPPING  05/09/2022   IR IMAGE GUIDED DRAINAGE PERCUT CATH  PERITONEAL RETROPERIT  05/09/2022   IR  PATIENT EVAL TECH 0-60 MINS  06/11/2022   IR RADIOLOGIST EVAL & MGMT  06/26/2022   IR RADIOLOGIST EVAL & MGMT  07/16/2022   IR THORACENTESIS ASP PLEURAL SPACE W/IMG GUIDE  09/23/2022   IR US GUIDE VASC ACCESS RIGHT  05/09/2022   LEFT HEART CATH AND CORONARY ANGIOGRAPHY N/A 04/17/2022   Procedure: LEFT HEART CATH AND CORONARY ANGIOGRAPHY;  Surgeon: Belva Crome, MD;  Location: [place] [facility];  Service: Cardiovascular;  Laterality: N/A;   POLYPECTOMY  04/16/2022   Procedure: POLYPECTOMY;  Surgeon: Yetta Flock, MD;  Location: [facility] ENDOSCOPY;  Service: Gastroenterology;;   RIGHT/LEFT HEART CATH AND CORONARY ANGIOGRAPHY N/A 09/24/2020   Procedure: RIGHT/LEFT HEART CATH AND CORONARY ANGIOGRAPHY;  Surgeon: Lorretta Harp, MD;  Location: [place] [facility];  Service: Cardiovascular;  Laterality: N/A;   SUBMUCOSAL TATTOO INJECTION  04/16/2022   Procedure: SUBMUCOSAL TATTOO INJECTION;  Surgeon: Yetta Flock, MD;  Location: [facility] ENDOSCOPY;  Service: Gastroenterology;;   WRIST TENODESIS       A IV Location/Drains/Wounds Patient Lines/Drains/Airways Status     Active Line/Drains/Airways     Name Placement date Placement time Site Days   Peripheral IV 01/10/2023 20 G Anterior;Proximal;Right Forearm 12/31/2022  --  Forearm  1   Peripheral IV 12/21/2022 20 G Posterior;Right Forearm 01/06/2023  1638  Forearm  1            Intake/Output Last 24 hours  Intake/Output Summary (Last 24 hours) at 01/08/2023 1138 Last data filed at 01/08/2023 0506 Gross per 24 hour  Intake 500 ml  Output 2 ml  Net 498 ml    Labs/Imaging Results for orders placed or performed during the hospital encounter of 01/06/2023 (from the past 48 hour(s))  Resp panel by RT-PCR (RSV, Flu A&B, Covid) Anterior Nasal Swab     Status: None   Collection Time: 12/20/2022  9:38 AM   Specimen: Anterior Nasal Swab  Result Value Ref Range   SARS Coronavirus 2 by RT PCR NEGATIVE NEGATIVE    Comment: (NOTE) SARS-CoV-2 target nucleic  acids are NOT DETECTED.  The SARS-CoV-2 RNA is generally detectable in upper respiratory specimens during the acute phase of infection. The lowest concentration of SARS-CoV-2 viral copies this assay can detect is 138 copies/mL. A negative result does not preclude SARS-Cov-2 infection and should not be used as the sole basis for treatment or other patient management decisions. A negative result may occur with  improper specimen collection/handling, submission of specimen other than nasopharyngeal swab, presence of viral mutation(s) within the areas targeted by this assay, and inadequate number of viral copies(<138 copies/mL). A negative result must be combined with clinical observations, patient history, and epidemiological information. The expected result is Negative.  Fact Sheet for Patients:  EntrepreneurPulse.com.au  Fact Sheet for Healthcare Providers:  IncredibleEmployment.be  This test is no t yet approved or cleared by the [place] FDA and  has been authorized for detection and/or diagnosis of SARS-CoV-2 by FDA under an Emergency Use Authorization (EUA). This EUA will remain  in effect (meaning this test can be used) for the duration of the COVID-19 declaration under Section 564(b)(1) of the Act, 21 U.S.C.section 360bbb-3(b)(1), unless the authorization  is terminated  or revoked sooner.       Influenza A by PCR NEGATIVE NEGATIVE   Influenza B by PCR NEGATIVE NEGATIVE    Comment: (NOTE) The Xpert Xpress SARS-CoV-2/FLU/RSV plus assay is intended as an aid in the diagnosis of influenza from Nasopharyngeal swab specimens and should not be used as a sole basis for treatment. Nasal washings and aspirates are unacceptable for Xpert Xpress SARS-CoV-2/FLU/RSV testing.  Fact Sheet for Patients: EntrepreneurPulse.com.au  Fact Sheet for Healthcare Providers: IncredibleEmployment.be  This test is not  yet approved or cleared by the [place] FDA and has been authorized for detection and/or diagnosis of SARS-CoV-2 by FDA under an Emergency Use Authorization (EUA). This EUA will remain in effect (meaning this test can be used) for the duration of the COVID-19 declaration under Section 564(b)(1) of the Act, 21 U.S.C. section 360bbb-3(b)(1), unless the authorization is terminated or revoked.     Resp Syncytial Virus by PCR NEGATIVE NEGATIVE    Comment: (NOTE) Fact Sheet for Patients: EntrepreneurPulse.com.au  Fact Sheet for Healthcare Providers: IncredibleEmployment.be  This test is not yet approved or cleared by the [place] FDA and has been authorized for detection and/or diagnosis of SARS-CoV-2 by FDA under an Emergency Use Authorization (EUA). This EUA will remain in effect (meaning this test can be used) for the duration of the COVID-19 declaration under Section 564(b)(1) of the Act, 21 U.S.C. section 360bbb-3(b)(1), unless the authorization is terminated or revoked.  Performed at [facility], [place] [address], [place], [place] [address]   Comprehensive metabolic panel     Status: Abnormal   Collection Time: 12/19/2022  9:56 AM  Result Value Ref Range   Sodium 136 135 - 145 mmol/L   Potassium 3.4 (L) 3.5 - 5.1 mmol/L    Comment: HEMOLYSIS AT THIS LEVEL MAY AFFECT RESULT   Chloride 109 98 - 111 mmol/L   CO2 17 (L) 22 - 32 mmol/L   Glucose, Bld 125 (H) 70 - 99 mg/dL    Comment: Glucose reference range applies only to samples taken after fasting for at least 8 hours.   BUN 27 (H) 8 - 23 mg/dL   Creatinine, Ser 1.22 0.61 - 1.24 mg/dL   Calcium 7.6 (L) 8.9 - 10.3 mg/dL   Total Protein 5.2 (L) 6.5 - 8.1 g/dL   Albumin 2.9 (L) 3.5 - 5.0 g/dL   AST 193 (H) 15 - 41 U/L    Comment: HEMOLYSIS AT THIS LEVEL MAY AFFECT RESULT   ALT 221 (H) 0 - 44 U/L    Comment: HEMOLYSIS AT THIS LEVEL MAY AFFECT RESULT   Alkaline Phosphatase 68 38  - 126 U/L   Total Bilirubin 1.0 0.3 - 1.2 mg/dL    Comment: HEMOLYSIS AT THIS LEVEL MAY AFFECT RESULT   GFR, Estimated >60 >60 mL/min    Comment: (NOTE) Calculated using the CKD-EPI Creatinine Equation (2021)    Anion gap 10 5 - 15    Comment: Performed at [facility], [place] [address] [place], [place] [address]  CBC with Differential     Status: Abnormal   Collection Time: 12/31/2022  9:56 AM  Result Value Ref Range   WBC 3.5 (L) 4.0 - 10.5 K/uL   RBC 4.64 4.22 - 5.81 MIL/uL   Hemoglobin 16.4 13.0 - 17.0 g/dL   HCT 47.1 39.0 - 52.0 %   MCV 101.5 (H) 80.0 - 100.0 fL   MCH 35.3 (H) 26.0 - 34.0 pg  MCHC 34.8 30.0 - 36.0 g/dL   RDW 22.9 (H) 11.5 - 15.5 %   Platelets 221 150 - 400 K/uL   nRBC 0.0 0.0 - 0.2 %   Neutrophils Relative % 51 %   Neutro Abs 1.8 1.7 - 7.7 K/uL   Lymphocytes Relative 25 %   Lymphs Abs 0.9 0.7 - 4.0 K/uL   Monocytes Relative 19 %   Monocytes Absolute 0.7 0.1 - 1.0 K/uL   Eosinophils Relative 3 %   Eosinophils Absolute 0.1 0.0 - 0.5 K/uL   Basophils Relative 1 %   Basophils Absolute 0.0 0.0 - 0.1 K/uL   WBC Morphology INCREASED BANDS (>20% BANDS)    RBC Morphology MORPHOLOGY UNREMARKABLE    Smear Review MORPHOLOGY UNREMARKABLE    Immature Granulocytes 1 %   Abs Immature Granulocytes 0.03 0.00 - 0.07 K/uL    Comment: Performed at [facility] [address] [place], [place] [address]  Troponin I (High Sensitivity)     Status: Abnormal   Collection Time: 12/23/2022  9:56 AM  Result Value Ref Range   Troponin I (High Sensitivity) 18 (H) <18 ng/L    Comment: (NOTE) Elevated high sensitivity troponin I (hsTnI) values and significant  changes across serial measurements may suggest ACS but many other  chronic and acute conditions are known to elevate hsTnI results.  Refer to the "Links" section for chest pain algorithms and additional  guidance. Performed at [facility], [place] [address] [place], [place] [address]   Brain natriuretic  peptide     Status: Abnormal   Collection Time: 12/25/2022  9:56 AM  Result Value Ref Range   B Natriuretic Peptide 150.5 (H) 0.0 - 100.0 pg/mL    Comment: Performed at [facility] [address], [place], [place] [address]  Magnesium     Status: None   Collection Time: 01/12/2023  9:56 AM  Result Value Ref Range   Magnesium 1.7 1.7 - 2.4 mg/dL    Comment: Performed at [facility] [address] [place], [place] [address]  Troponin I (High Sensitivity)     Status: Abnormal   Collection Time: 12/28/2022 12:05 PM  Result Value Ref Range   Troponin I (High Sensitivity) 19 (H) <18 ng/L    Comment: (NOTE) Elevated high sensitivity troponin I (hsTnI) values and significant  changes across serial measurements may suggest ACS but many other  chronic and acute conditions are known to elevate hsTnI results.  Refer to the "Links" section for chest pain algorithms and additional  guidance. Performed at [facility], [place] [address], [place], [place] [address]   Comprehensive metabolic panel     Status: Abnormal   Collection Time: 01/08/23  4:29 AM  Result Value Ref Range   Sodium 135 135 - 145 mmol/L   Potassium 3.4 (L) 3.5 - 5.1 mmol/L   Chloride 107 98 - 111 mmol/L   CO2 20 (L) 22 - 32 mmol/L   Glucose, Bld 145 (H) 70 - 99 mg/dL    Comment: Glucose reference range applies only to samples taken after fasting for at least 8 hours.   BUN 28 (H) 8 - 23 mg/dL   Creatinine, Ser 1.19 0.61 - 1.24 mg/dL   Calcium 7.7 (L) 8.9 - 10.3 mg/dL   Total Protein 5.1 (L) 6.5 - 8.1 g/dL   Albumin 2.8 (L) 3.5 - 5.0 g/dL   AST 201 (H) 15 - 41 U/L   ALT  280 (H) 0 - 44 U/L   Alkaline Phosphatase 65 38 - 126 U/L   Total Bilirubin 0.9 0.3 - 1.2 mg/dL   GFR, Estimated >60 >60 mL/min    Comment: (NOTE) Calculated using the CKD-EPI Creatinine Equation (2021)    Anion gap 8 5 - 15    Comment: Performed at [facility] [address] [place], [place] [address]  Magnesium     Status:  Abnormal   Collection Time: 01/08/23  6:45 AM  Result Value Ref Range   Magnesium 2.5 (H) 1.7 - 2.4 mg/dL    Comment: Performed at [facility] [address], [place], [place] [address]   DG Chest Port 1 View  Result Date: 12/31/2022 CLINICAL DATA:  Pacer fired EXAM: PORTABLE CHEST 1 VIEW COMPARISON:  09/23/2022 FINDINGS: Left base subsegmental atelectasis or consolidation and small left-sided effusion are stable findings. Aorta is tortuous and calcified. No pneumothorax. Normal pulmonary vasculature. Left-sided pacemaker. IMPRESSION: Left base subsegmental atelectasis or scarring and minimal left-sided pleural effusion. Otherwise no acute cardiopulmonary process. Electronically Signed   By: Sammie Bench M.D.   On: 12/17/2022 10:10    Pending Labs Unresulted Labs (From admission, onward)    None       Vitals/Pain Today's Vitals   01/08/23 0730 01/08/23 1000 01/08/23 1024 01/08/23 1030  BP: 92/71 95/71 103/79 110/82  Pulse: 69 72 76 77  Resp: '16 14  19  '$ Temp:      TempSrc:      SpO2: 96% 97%  96%  Weight:      Height:      PainSc:        Isolation Precautions No active isolations  Medications Medications  amiodarone (NEXTERONE) 1.8 mg/mL load via infusion 150 mg (150 mg Intravenous Bolus from Bag 01/10/2023 1332)    Followed by  amiodarone (NEXTERONE PREMIX) 360-4.14 MG/200ML-% (1.8 mg/mL) IV infusion (0 mg/hr Intravenous Stopped 01/02/2023 1939)    Followed by  amiodarone (NEXTERONE PREMIX) 360-4.14 MG/200ML-% (1.8 mg/mL) IV infusion (30 mg/hr Intravenous New Bag/Given 01/08/23 0206)  nitroGLYCERIN (NITROSTAT) SL tablet 0.4 mg (has no administration in time range)  acetaminophen (TYLENOL) tablet 650 mg (has no administration in time range)  atorvastatin (LIPITOR) tablet 80 mg (80 mg Oral Given 01/10/2023 2035)  ezetimibe (ZETIA) tablet 10 mg (10 mg Oral Given 01/08/23 1025)  furosemide (LASIX) tablet 40 mg (40 mg Oral Given 01/08/23 1024)  metoprolol succinate  (TOPROL-XL) 24 hr tablet 50 mg (50 mg Oral Given 01/08/23 1024)  sacubitril-valsartan (ENTRESTO) 49-51 mg per tablet (1 tablet Oral Given 01/08/23 1025)  ALPRAZolam (XANAX) tablet 0.25 mg (0.25 mg Oral Given 01/03/2023 2037)  traZODone (DESYREL) tablet 50 mg (50 mg Oral Given 12/16/2022 2144)  dapagliflozin propanediol (FARXIGA) tablet 10 mg (10 mg Oral Given 01/08/23 1025)  levothyroxine (SYNTHROID) tablet 50 mcg (50 mcg Oral Given 01/08/23 0505)  calcium carbonate (TUMS - dosed in mg elemental calcium) chewable tablet 200 mg of elemental calcium (200 mg of elemental calcium Oral Given 01/08/23 1025)  polyethylene glycol (MIRALAX / GLYCOLAX) packet 17 g (has no administration in time range)  apixaban (ELIQUIS) tablet 5 mg (5 mg Oral Given 01/08/23 1025)  clopidogrel (PLAVIX) tablet 75 mg (75 mg Oral Given 01/08/23 1024)  levETIRAcetam (KEPPRA) tablet 1,500 mg (1,500 mg Oral Given 01/08/23 1023)  diclofenac Sodium (VOLTAREN) 1 % topical gel 2 g (has no administration in time range)  spironolactone (ALDACTONE) tablet 12.5 mg (12.5 mg Oral Given 01/08/23  1025)  sodium chloride 0.9 % bolus 500 mL (0 mLs Intravenous Stopped 12/31/2022 1150)  magnesium sulfate IVPB 4 g 100 mL (0 g Intravenous Stopped 12/15/2022 1758)  potassium chloride SA (KLOR-CON M) CR tablet 40 mEq (40 mEq Oral Given 12/17/2022 1438)  potassium chloride SA (KLOR-CON M) CR tablet 40 mEq (40 mEq Oral Given 01/08/23 1120)    Mobility  1 person assist OOB to [facility]      Focused Assessments Cardio    R Recommendations: See Admitting Provider Note  Report given to:   Additional Notes:

## 2023-01-09 ENCOUNTER — Other Ambulatory Visit (HOSPITAL_COMMUNITY): Payer: Self-pay

## 2023-01-09 DIAGNOSIS — I472 Ventricular tachycardia, unspecified: Secondary | ICD-10-CM | POA: Diagnosis not present

## 2023-01-09 LAB — BASIC METABOLIC PANEL
Anion gap: 7 (ref 5–15)
BUN: 31 mg/dL — ABNORMAL HIGH (ref 8–23)
CO2: 20 mmol/L — ABNORMAL LOW (ref 22–32)
Calcium: 7.9 mg/dL — ABNORMAL LOW (ref 8.9–10.3)
Chloride: 107 mmol/L (ref 98–111)
Creatinine, Ser: 1.25 mg/dL — ABNORMAL HIGH (ref 0.61–1.24)
GFR, Estimated: >60 mL/min (ref >60)
Glucose, Bld: 151 mg/dL — ABNORMAL HIGH (ref 70–99)
Potassium: 3.5 mmol/L (ref 3.5–5.1)
Sodium: 134 mmol/L — ABNORMAL LOW (ref 135–145)

## 2023-01-09 LAB — LACTIC ACID, PLASMA
Lactic Acid, Venous: 1.9 mmol/L (ref 0.5–1.9)
Lactic Acid, Venous: 2 mmol/L (ref 0.5–1.9)

## 2023-01-09 LAB — MAGNESIUM: Magnesium: 2.3 mg/dL (ref 1.7–2.4)

## 2023-01-09 MED ORDER — POTASSIUM CHLORIDE 10 MEQ/100ML IV SOLN
10.0000 meq | INTRAVENOUS | Status: AC
  Administered 2023-01-09 (×2): 10 meq via INTRAVENOUS
  Filled 2023-01-09 (×2): qty 100

## 2023-01-09 MED ORDER — ADULT MULTIVITAMIN W/MINERALS CH
1.0000 | ORAL_TABLET | Freq: Every day | ORAL | Status: DC
  Administered 2023-01-12: 1 via ORAL
  Filled 2023-01-09 (×2): qty 1

## 2023-01-09 MED ORDER — SACUBITRIL-VALSARTAN 49-51 MG PO TABS
1.0000 | ORAL_TABLET | Freq: Two times a day (BID) | ORAL | Status: DC
  Filled 2023-01-09 (×3): qty 1

## 2023-01-09 MED ORDER — POTASSIUM CHLORIDE CRYS ER 20 MEQ PO TBCR
40.0000 meq | EXTENDED_RELEASE_TABLET | ORAL | Status: AC
  Administered 2023-01-09 (×2): 40 meq via ORAL
  Filled 2023-01-09 (×2): qty 2

## 2023-01-09 MED ORDER — ENSURE ENLIVE PO LIQD
237.0000 mL | Freq: Three times a day (TID) | ORAL | Status: DC
  Administered 2023-01-09 – 2023-01-12 (×2): 237 mL via ORAL
  Filled 2023-01-09: qty 237

## 2023-01-09 MED ORDER — LOPERAMIDE HCL 2 MG PO CAPS
2.0000 mg | ORAL_CAPSULE | ORAL | Status: DC | PRN
  Administered 2023-01-09 (×2): 2 mg via ORAL
  Filled 2023-01-09 (×3): qty 1

## 2023-01-09 NOTE — Progress Notes (Addendum)
Rounding Note    Patient Name: Gregory [name] Date of Encounter: 01/09/2023  [place] Cardiologist: Sanda Klein, MD  Dr. Caryl Comes  Subjective   No CP, SOB, tired  Inpatient Medications    Scheduled Meds:  apixaban  5 mg Oral BID   atorvastatin  80 mg Oral QPM   calcium carbonate  1 tablet Oral Daily   clopidogrel  75 mg Oral Daily   dapagliflozin propanediol  10 mg Oral Daily   ezetimibe  10 mg Oral Daily   furosemide  40 mg Oral Daily   levETIRAcetam  1,500 mg Oral BID   levothyroxine  50 mcg Oral Q0600   metoprolol succinate  50 mg Oral BID   potassium chloride  40 mEq Oral Q4H   sacubitril-valsartan  1 tablet Oral BID   spironolactone  12.5 mg Oral Daily   traZODone  50 mg Oral QHS   Continuous Infusions:  amiodarone 30 mg/hr (01/09/23 0843)   PRN Meds: acetaminophen, ALPRAZolam, diclofenac Sodium, loperamide, nitroGLYCERIN, polyethylene glycol   Vital Signs    Vitals:   01/09/23 0008 01/09/23 0011 01/09/23 0807 01/09/23 0839  BP:  98/63 (!) 81/70 (!) 83/65  Pulse:  67 74   Resp:  17 18   Temp:  97.6 F (36.4 C) 97.8 F (36.6 C)   TempSrc: Oral Oral Oral   SpO2:  100% 100%   Weight:      Height:        Intake/Output Summary (Last 24 hours) at 01/09/2023 0853 Last data filed at 01/09/2023 0843 Gross per 24 hour  Intake 856.53 ml  Output --  Net 856.53 ml      12/24/2022    9:12 AM 12/22/2022   11:30 AM 12/03/2022    2:27 PM  Last 3 Weights  Weight (lbs) 220 lb 216 lb 3.2 oz 214 lb 9.6 oz  Weight (kg) 99.791 kg 98.068 kg 97.342 kg      Telemetry    SR, occ-frequent PVCs, occ couplets, rare short NSVT 3beats - Personally Reviewed  ECG    No new EKGs - Personally Reviewed  Physical Exam   GEN: No acute distress, chronically ill appearing.   Neck: No JVD Cardiac: RRR, no murmurs, rubs, or gallops.  Respiratory: CTA b/l. GI: Soft, nontender, non-distended  MS: No edema; No deformity. Neuro:  Nonfocal  Psych:  Normal affect   Labs    High Sensitivity Troponin:   Recent Labs  Lab 12/26/2022 0956 12/22/2022 1205  TROPONINIHS 18* 19*     Chemistry Recent Labs  Lab 01/03/2023 0956 01/08/23 0429 01/08/23 0645 01/09/23 0344  NA 136 135  --  134*  K 3.4* 3.4*  --  3.5  CL 109 107  --  107  CO2 17* 20*  --  20*  GLUCOSE 125* 145*  --  151*  BUN 27* 28*  --  31*  CREATININE 1.22 1.19  --  1.25*  CALCIUM 7.6* 7.7*  --  7.9*  MG 1.7  --  2.5* 2.3  PROT 5.2* 5.1*  --   --   ALBUMIN 2.9* 2.8*  --   --   AST 193* 201*  --   --   ALT 221* 280*  --   --   ALKPHOS 68 65  --   --   BILITOT 1.0 0.9  --   --   GFRNONAA >60 >60  --  >60  ANIONGAP 10 8  --  7  Lipids No results for input(s): "CHOL", "TRIG", "HDL", "LABVLDL", "LDLCALC", "CHOLHDL" in the last 168 hours.  Hematology Recent Labs  Lab 12/30/2022 0956  WBC 3.5*  RBC 4.64  HGB 16.4  HCT 47.1  MCV 101.5*  MCH 35.3*  MCHC 34.8  RDW 22.9*  PLT 221   Thyroid No results for input(s): "TSH", "FREET4" in the last 168 hours.  BNP Recent Labs  Lab 12/31/2022 0956  BNP 150.5*    DDimer No results for input(s): "DDIMER" in the last 168 hours.   Radiology    DG Chest Port 1 View  Result Date: 12/20/2022 CLINICAL DATA:  Pacer fired EXAM: PORTABLE CHEST 1 VIEW COMPARISON:  09/23/2022 FINDINGS: Left base subsegmental atelectasis or consolidation and small left-sided effusion are stable findings. Aorta is tortuous and calcified. No pneumothorax. Normal pulmonary vasculature. Left-sided pacemaker. IMPRESSION: Left base subsegmental atelectasis or scarring and minimal left-sided pleural effusion. Otherwise no acute cardiopulmonary process. Electronically Signed   By: Sammie Bench M.D.   On: 12/26/2022 10:10    Cardiac Studies   04/17/22: [facility] CONCLUSIONS: Widely patent left main Mid segment of LAD contains 40 to 50% narrowing.  The large first diagonal contains a patent stent in the mid body.  Proximal to the stent is a 50%  stenosis. Circumflex is totally occluded beyond the origin of the large first obtuse marginal.  Left-to-right collaterals are noted.  The proximal circumflex before the origin of the first obtuse marginal contains 50 to 70% stenosis.  There is progression of this lesion compared to prior. Right coronary is totally occluded proximally.  It reconstitutes in the distal segment.  PDA is totally occluded.  PDA and left ventricular branches fill via left to right collaterals. Recommendations: Per managing team. Anatomy is similar to prior with the following exceptions: 50% to 70% stenosis proximal to the large first obtuse marginal.  40 to 50% stenosis proximal to the stent segment in the first diagonal.     Echocardiogram 04/07/2022: Impressions:  1. Difficult acoustic windows Endocardium not well seen Overall there  does not appear to be a signfiicant change from echo in 2021.   2. LV function is depressed with hypokinesis of the lateral wall,  mid/distal inferior wall and apex; akinesis with aneurysmal dilitation of  inferior base. . Left ventricular ejection fraction, by estimation, is 35  to 40%. The left ventricle has  moderately decreased function. The left ventricular internal cavity size  was moderately dilated. There is mild left ventricular hypertrophy. Left  ventricular diastolic parameters are indeterminate.   3. Right ventricular systolic function is normal. The right ventricular  size is normal. There is mildly elevated pulmonary artery systolic  pressure.   4. Left atrial size was severely dilated.   5. Right atrial size was mildly dilated.   6. The mitral valve is normal in structure. Mild mitral valve  regurgitation.   7. The aortic valve is tricuspid. Aortic valve regurgitation is not  visualized. Aortic valve sclerosis/calcification is present, without any  evidence of aortic stenosis.   8. There is borderline dilatation of the aortic root, measuring 39 mm.  There is  borderline dilatation of the ascending aorta, measuring 39 mm.   Patient Profile     62 y.o. male with CAD (known chronic occl of RCA with prior PCIs to LAD, diag, and LCx), VT, ICM,  OSA w/CPAP, seizures, chronic CHF, HLD, HTN, obesity, AFib, h/o VT, PVCs admitted with VT storm  Device information Medtronic Dual Chamber ICD implanted  2005, 2010 and gen change for 2018   2017 to have slow VT prompting reduction of his VT zone rate to 150bpm.   Oct 2021, VT with appropriate therapies   He had stopped drinking (heavily at least).  He noted previously had been on amiodarone and mexiletine , the mexiletine stopped 2/2 GI symptoms, and the amiodarone stopped 2/2 abn ALk phos   Oct 2021 > amio restarted for recurrent VT   Device interrogation ([facility])\presenting SR, PVCs Battery alnd lead numbers are good VP 0.2% OptiVol looks good 2 treated VT episode Total of 12 spins of ATP 1st episode ATP scheme was successful 2ns episode was not after all 9 ATPs delivered No shocks programmed in this zone (136-182,   Assessment & Plan    VT storm VT rates remains in zone one, ATP scheme did work initially though was exhausted on his zone therapies He arrived in SR He is chronically on amiodarone Elevated LFTs (28% incidence of LFT elevations w/Xeloda)  No real other AAD option Historically had failed mexiletine with GI intolerances, he is now chronically nauseous with his chemotherapy and Almer Littleton not try to re-add mexiletine.  Elevated LFTs, xeloda and amio can contribute Stable Perhaps more with the VT   Midl hypokalemia persists, given his hx of VT,known irritability of rhythm, mild hypokalemia may have been the trigger. Saw [place] give 2 runs of K+ IV and PO   BP a little low this AM, MAP is OK,  no symptoms (94/60 when I am in the room) Habiba Treloar stop aldactone and hold off on lasix today with stable volume status. Hold this AM Entresto Hold toprol until SBP back in the 90's  He had more  sustained VT yesterday afternoon and amio gtt remains VT rate was 135, below detection and spontaneously converted, lasted about 1 minute  Unfortunately I think we Ruger Saxer need to think about GOC, though would prefer that with his attending team out pt that know him well  Discussed with the patient He wants ICD Therapies to remain on, full code I Fleur Audino discuss with Dr. Curt Bears if we need/should reduce VT zone rate   Suspect we Elyana Grabski transition to PO amiodarone today and discharge tomorrow     CAD No anginal symptoms HS Trops unremarkable Cath may last year with stable CAD, no interventions (cath noted above)   ICM Chronic CHF Not volume OL by exam, CXR or OptiVol Volume status remains OK Benjamyn Hestand add spironolactone to help keep his K= supplemented   Paroxysmal Afib CHA2DS2Vasc is 4, on Eliquis  6. Colon Ca Dr. Burr Medico saw him, planned to stop Xeloda and revisit alternative palliative chemo options outpt after a surveillance CT  I have consulted dietician for their insight/recommendations, not yet seen him      For questions or updates, please contact [place] Please consult [email or web address] for contact info under        Signed, Baldwin Jamaica, PA-C  01/09/2023, 8:53 AM    I have seen and examined this patient with Tommye Standard.  Agree with above, note added to reflect my findings.  Patient continues to complain of mild nausea, shortness of breath, fatigue.  No further VT since yesterday.  GEN: Well nourished, well developed, in no acute distress  HEENT: normal  Neck: no JVD, carotid bruits, or masses Cardiac: RRR; no murmurs, rubs, or gallops,no edema  Respiratory:  clear to auscultation bilaterally, normal work of breathing GI: soft, nontender, nondistended, + BS MS: no deformity or atrophy  Skin: warm and dry, device site well healed Neuro:  Strength and sensation are intact Psych: euthymic mood, full affect   VT storm: Currently on IV amiodarone.  Has had no  further VT since yesterday.  Working to replete potassium as it remains low.  If he has no further VT overnight tonight, we Revis Whalin switch him to p.o. amiodarone and potentially discharge tomorrow. Coronary artery disease: No current angina Chronic systolic heart failure: No obvious volume overload.  Aldactone added with potassium supplementation. Paroxysmal atrial fibrillation: No current episodes.  Continue Eliquis. Colon cancer: Currently on Xeloda.  Plan for outpatient oncology follow-up  Kenza Munar M. Jeanelle Dake MD 01/09/2023 1:18 PM

## 2023-01-09 NOTE — Progress Notes (Signed)
Initial Nutrition Assessment  DOCUMENTATION CODES:   Severe malnutrition in context of chronic illness  INTERVENTION:  Liberalize diet to regular to remove restrictions and encourage oral intake in setting of recent poor intake.   Provide Ensure Enlive po TID, each supplement provides 350 kcal and 20 grams of protein. Patient prefers strawberry or chocolate.  Provide Magic cup BID with lunch and dinner, each supplement provides 290 kcal and 9 grams of protein.  Provide multivitamin with minerals po daily.  NUTRITION DIAGNOSIS:   Severe Malnutrition related to chronic illness (CHF, metastatic colon cancer) as evidenced by severe fat depletion, severe muscle depletion.  GOAL:   Patient will meet greater than or equal to 90% of their needs  MONITOR:   PO intake, Supplement acceptance, Labs, Weight trends, I & O's  REASON FOR ASSESSMENT:   Malnutrition Screening Tool, Consult Assessment of nutrition requirement/status  ASSESSMENT:   62 year old male with PMHx of CAD, ischemic cardiomyopathy, VT, CHF, hx placement of ICD, HTN, HLD, OSA with CPAP, seizures, stage III/IV cancer of right colon s/p resection 04/18/22 with post-operative course complicated by intra-abdominal abscess requiring drain placement, started on adjuvant Xeloda 07/21/22 who is now admitted with VT storm.  -Pt was seen by Dr. Lafe Garin 1/25. Plan is to stop Xeloda and revisit alternative palliative chemo options after surveillance CT.  Met with patient at bedside and also spoke with patient's daughter over the phone per request of patient to assist in obtaining history. Patient has had a decreasing appetite and intake for a while now, but it has worsened over the past several days to week. Typically at baseline pt has 3-4 meals plus snacks between meals. For breakfast he usually has eggs, toast or oatmeal or waffles. For his other meals he has protein with sides and homemade soup made by daughter. He follows a low sodium  diet at baseline. He may have a snack of peanut butter and jelly with Ensure High Protein. Daughter reports over the past few days he may only have 1/2 sandwich, banana, and some Ensure supplements during the day (pt reports lately drinking up to 3 Ensure per day). Denies food allergies/intolerances. Endorses some abdominal pain and occasional nausea but no emesis. Denies difficulty with chewing/swallowing. Pt ate 0% of breakfast this morning but no other meal documentation available in chart. He is amenable to drinking Ensure Enlive/Ensure Plus High Protein to help meet calorie/protein needs while appetite is decreased. Will also plan to try Magic Cup supplements with meals. Patient would benefit from liberalization of diet to remove restrictions and help improve intake.   Patient's UBW is 215-220 lbs. Daughter reports pt has lost weight over time to 200 lbs. Noted weights in chart variable 91.6-100.8 kg over the past few months so difficult to trend. Suspect admission wt of 99.8 kg (220 lbs) was stated and not measured. Recommend obtaining accurate weight.  Medications reviewed and include: calcium carbonate 1 tablet daily, Keppra, levothyroxine, amiodarone  Labs reviewed: Sodium 134, CO2 20, BUN 31, Creatinine 1.25  UOP: 4 occurrences unmeasured UOP  I/O: +1034 mL since admission  NUTRITION - FOCUSED PHYSICAL EXAM:  Flowsheet Row Most Recent Value  Orbital Region Moderate depletion  Upper Arm Region Severe depletion  Thoracic and Lumbar Region Moderate depletion  Buccal Region Severe depletion  Temple Region Moderate depletion  Clavicle Bone Region Severe depletion  Clavicle and Acromion Bone Region Severe depletion  Scapular Bone Region Severe depletion  Dorsal Hand Severe depletion  Patellar Region Severe depletion  Anterior Thigh Region Severe depletion  Posterior Calf Region Severe depletion  Edema (RD Assessment) Mild  Hair Reviewed  Eyes Reviewed  Mouth Reviewed  Skin Reviewed   Nails Reviewed       Diet Order:   Diet Order             Diet heart healthy/carb modified Room service appropriate? Yes; Fluid consistency: Thin  Diet effective now                  EDUCATION NEEDS:   Education needs have been addressed (Discussed importance of adequate intake of calories and protein.)  Skin:  Skin Assessment: Reviewed RN Assessment  Last BM:  01/09/23 - large type 7  Height:   Ht Readings from Last 1 Encounters:  12/17/2022 '6\' 6"'$  (1.981 m)   Weight:   Wt Readings from Last 1 Encounters:  12/20/2022 99.8 kg   Ideal Body Weight:  97.3 kg  BMI:  Body mass index is 25.42 kg/m.  Estimated Nutritional Needs:   Kcal:  2300-2500  Protein:  115-125 grams  Fluid:  2 L/day or per MD  Loanne Drilling, MS, RD, LDN, CNSC Pager number available on Amion

## 2023-01-10 ENCOUNTER — Inpatient Hospital Stay (HOSPITAL_COMMUNITY): Payer: Commercial Managed Care - HMO

## 2023-01-10 DIAGNOSIS — Z7189 Other specified counseling: Secondary | ICD-10-CM

## 2023-01-10 DIAGNOSIS — R578 Other shock: Secondary | ICD-10-CM

## 2023-01-10 DIAGNOSIS — I509 Heart failure, unspecified: Secondary | ICD-10-CM

## 2023-01-10 DIAGNOSIS — I472 Ventricular tachycardia, unspecified: Secondary | ICD-10-CM

## 2023-01-10 DIAGNOSIS — R0609 Other forms of dyspnea: Secondary | ICD-10-CM | POA: Diagnosis not present

## 2023-01-10 DIAGNOSIS — R579 Shock, unspecified: Secondary | ICD-10-CM | POA: Diagnosis not present

## 2023-01-10 DIAGNOSIS — R6521 Severe sepsis with septic shock: Secondary | ICD-10-CM | POA: Diagnosis not present

## 2023-01-10 DIAGNOSIS — G9341 Metabolic encephalopathy: Secondary | ICD-10-CM

## 2023-01-10 DIAGNOSIS — A419 Sepsis, unspecified organism: Secondary | ICD-10-CM | POA: Diagnosis not present

## 2023-01-10 LAB — ECHOCARDIOGRAM COMPLETE
Area-P 1/2: 1.84 cm2
Height: 78 in
S' Lateral: 5.1 cm
Single Plane A2C EF: 36.2 %
Weight: 3153.46 oz

## 2023-01-10 LAB — PROTIME-INR
INR: 2.8 — ABNORMAL HIGH (ref 0.8–1.2)
Prothrombin Time: 29 seconds — ABNORMAL HIGH (ref 11.4–15.2)

## 2023-01-10 LAB — COMPREHENSIVE METABOLIC PANEL
ALT: 355 U/L — ABNORMAL HIGH (ref 0–44)
AST: 214 U/L — ABNORMAL HIGH (ref 15–41)
Albumin: 2.5 g/dL — ABNORMAL LOW (ref 3.5–5.0)
Alkaline Phosphatase: 81 U/L (ref 38–126)
Anion gap: 13 (ref 5–15)
BUN: 53 mg/dL — ABNORMAL HIGH (ref 8–23)
CO2: 15 mmol/L — ABNORMAL LOW (ref 22–32)
Calcium: 7.8 mg/dL — ABNORMAL LOW (ref 8.9–10.3)
Chloride: 106 mmol/L (ref 98–111)
Creatinine, Ser: 2.11 mg/dL — ABNORMAL HIGH (ref 0.61–1.24)
GFR, Estimated: 34 mL/min — ABNORMAL LOW (ref >60)
Glucose, Bld: 145 mg/dL — ABNORMAL HIGH (ref 70–99)
Potassium: 3.9 mmol/L (ref 3.5–5.1)
Sodium: 134 mmol/L — ABNORMAL LOW (ref 135–145)
Total Bilirubin: 0.8 mg/dL (ref 0.3–1.2)
Total Protein: 4.7 g/dL — ABNORMAL LOW (ref 6.5–8.1)

## 2023-01-10 LAB — LACTIC ACID, PLASMA
Lactic Acid, Venous: 1.9 mmol/L (ref 0.5–1.9)
Lactic Acid, Venous: 2.6 mmol/L (ref 0.5–1.9)
Lactic Acid, Venous: 1.7 mmol/L (ref 0.5–1.9)

## 2023-01-10 LAB — CBC WITH DIFFERENTIAL/PLATELET
Abs Immature Granulocytes: 0.13 10*3/uL — ABNORMAL HIGH (ref 0.00–0.07)
Basophils Absolute: 0 10*3/uL (ref 0.0–0.1)
Basophils Relative: 0 %
Eosinophils Absolute: 0 10*3/uL (ref 0.0–0.5)
Eosinophils Relative: 0 %
HCT: 47.5 % (ref 39.0–52.0)
Hemoglobin: 16.3 g/dL (ref 13.0–17.0)
Immature Granulocytes: 1 %
Lymphocytes Relative: 9 %
Lymphs Abs: 1.2 10*3/uL (ref 0.7–4.0)
MCH: 34.8 pg — ABNORMAL HIGH (ref 26.0–34.0)
MCHC: 34.3 g/dL (ref 30.0–36.0)
MCV: 101.5 fL — ABNORMAL HIGH (ref 80.0–100.0)
Monocytes Absolute: 2.2 10*3/uL — ABNORMAL HIGH (ref 0.1–1.0)
Monocytes Relative: 18 %
Neutro Abs: 9 10*3/uL — ABNORMAL HIGH (ref 1.7–7.7)
Neutrophils Relative %: 72 %
Platelets: 243 10*3/uL (ref 150–400)
RBC: 4.68 MIL/uL (ref 4.22–5.81)
RDW: 24.2 % — ABNORMAL HIGH (ref 11.5–15.5)
WBC Morphology: INCREASED
WBC: 12.5 10*3/uL — ABNORMAL HIGH (ref 4.0–10.5)
nRBC: 0.2 % (ref 0.0–0.2)

## 2023-01-10 LAB — BASIC METABOLIC PANEL
Anion gap: 11 (ref 5–15)
BUN: 48 mg/dL — ABNORMAL HIGH (ref 8–23)
CO2: 14 mmol/L — ABNORMAL LOW (ref 22–32)
Calcium: 7.9 mg/dL — ABNORMAL LOW (ref 8.9–10.3)
Chloride: 109 mmol/L (ref 98–111)
Creatinine, Ser: 1.75 mg/dL — ABNORMAL HIGH (ref 0.61–1.24)
GFR, Estimated: 43 mL/min — ABNORMAL LOW (ref >60)
Glucose, Bld: 151 mg/dL — ABNORMAL HIGH (ref 70–99)
Potassium: 3.9 mmol/L (ref 3.5–5.1)
Sodium: 134 mmol/L — ABNORMAL LOW (ref 135–145)

## 2023-01-10 LAB — GLUCOSE, CAPILLARY: Glucose-Capillary: 163 mg/dL — ABNORMAL HIGH (ref 70–99)

## 2023-01-10 LAB — TSH: TSH: 36.751 u[IU]/mL — ABNORMAL HIGH (ref 0.350–4.500)

## 2023-01-10 LAB — APTT: aPTT: 32 seconds (ref 24–36)

## 2023-01-10 MED ORDER — SODIUM CHLORIDE 0.9 % IV BOLUS
250.0000 mL | Freq: Once | INTRAVENOUS | Status: AC
  Administered 2023-01-10: 250 mL via INTRAVENOUS

## 2023-01-10 MED ORDER — VANCOMYCIN HCL 1750 MG/350ML IV SOLN
1750.0000 mg | Freq: Once | INTRAVENOUS | Status: DC
  Filled 2023-01-10: qty 350

## 2023-01-10 MED ORDER — PIPERACILLIN-TAZOBACTAM 3.375 G IVPB
3.3750 g | Freq: Three times a day (TID) | INTRAVENOUS | Status: DC
  Administered 2023-01-10: 3.375 g via INTRAVENOUS
  Filled 2023-01-10 (×2): qty 50

## 2023-01-10 MED ORDER — CHLORHEXIDINE GLUCONATE CLOTH 2 % EX PADS
6.0000 | MEDICATED_PAD | Freq: Every day | CUTANEOUS | Status: DC
  Administered 2023-01-11 – 2023-01-12 (×2): 6 via TOPICAL

## 2023-01-10 MED ORDER — SODIUM CHLORIDE 0.9 % IV BOLUS
500.0000 mL | Freq: Once | INTRAVENOUS | Status: AC
  Administered 2023-01-10: 500 mL via INTRAVENOUS

## 2023-01-10 MED ORDER — ALBUMIN HUMAN 5 % IV SOLN
12.5000 g | Freq: Once | INTRAVENOUS | Status: AC
  Administered 2023-01-10: 12.5 g via INTRAVENOUS
  Filled 2023-01-10: qty 250

## 2023-01-10 MED ORDER — SODIUM CHLORIDE 0.9 % IV SOLN
2.0000 g | Freq: Two times a day (BID) | INTRAVENOUS | Status: DC
  Administered 2023-01-10 – 2023-01-12 (×4): 2 g via INTRAVENOUS
  Filled 2023-01-10 (×3): qty 12.5

## 2023-01-10 MED ORDER — SODIUM CHLORIDE 0.9 % IV SOLN: INTRAVENOUS | Status: AC

## 2023-01-10 MED ORDER — PIPERACILLIN-TAZOBACTAM 3.375 G IVPB
3.3750 g | Freq: Three times a day (TID) | INTRAVENOUS | Status: DC
  Filled 2023-01-10: qty 50

## 2023-01-10 MED ORDER — IOHEXOL 9 MG/ML PO SOLN: 500.0000 mL | ORAL | Status: AC

## 2023-01-10 MED ORDER — NOREPINEPHRINE 4 MG/250ML-% IV SOLN: 0.0000 ug/min | INTRAVENOUS | Status: DC

## 2023-01-10 MED ORDER — SODIUM CHLORIDE 0.9 % IV SOLN
250.0000 mL | INTRAVENOUS | Status: DC
  Administered 2023-01-10 – 2023-01-12 (×4): 250 mL via INTRAVENOUS

## 2023-01-10 MED ORDER — NOREPINEPHRINE 4 MG/250ML-% IV SOLN
2.0000 ug/min | INTRAVENOUS | Status: DC
  Administered 2023-01-10 – 2023-01-12 (×8): 10 ug/min via INTRAVENOUS
  Filled 2023-01-10 (×9): qty 250

## 2023-01-10 MED ORDER — VANCOMYCIN VARIABLE DOSE PER UNSTABLE RENAL FUNCTION (PHARMACIST DOSING): Status: DC

## 2023-01-10 NOTE — Progress Notes (Addendum)
This is a 62 year old male with past medical history of colon cancer, CAD and admitted for VT storm.  His blood pressure was soft yesterday, this morning his blood pressure dropped down to the 60s.  His blood pressure and remained hypotensive around 60 to 70s for the past 3 hours.  I went in to check on the patient, he is mildly drowsy however still alert and able to answer majority of the questions.  I discussed his case with Dr. Curt Bears, heart failure service has been consulted this morning.  He is creatinine jumped to 1.75 from the 1.25 yesterday.  Lactic acid this morning was 1.9.  Will change echo as stat.  I have ordered 250 bolus of IV fluid, however given his low EF, will run IV fluid at 100 mL/h.  Patient will be transferred to the ICU.  IV Levophed ordered to run via peripheral line, he likely will need central line.  Palliative care service consulted. Updated his daughter regarding his declining condition.

## 2023-01-10 NOTE — Significant Event (Signed)
Rapid Response Event Note   Reason for Call :  Hypotensive, needing Levophed  Initial Focused Assessment:  Drowsy Lung sounds faint scattered rhonchi Heart tones regular His skin is dry, cool  BP 61/47  HR 68  RR 18  O2 sat 93% on 3L [place]   Interventions:  250 NS infusing at 100cc/hr (per orders) Dr Daniel Nones at bedside, Daughter at bedside Increased to 999/hr per MD Levophed started at 75mg 2nd 250 NS bolus   BP 84/60  HR 64  RR   O2 sat 94% on [place]  500cc NS bolus Lab at bedside to draw labs PCXR done IV team at bedside to start ultrasound IV, Levophed changed to this line Zosyn started with one BC drawn  Hand off with MRip HarbourRN CCM consulted    Plan of Care:  Transfer to ICU   Event Summary:   MD Notified: SForest HillCall Time: 1[place]townTime: 1005 End Time: 1215  LRaliegh Ip RN

## 2023-01-10 NOTE — Progress Notes (Signed)
Upon confirmation with the [facility] NP in [place], pt does NOT want ACLS meds or ACLS defib.

## 2023-01-10 NOTE — Progress Notes (Signed)
Patient belongings - Cell phone, charger, and watch taken home by Daughter Anisha. Leveda Anna, RN

## 2023-01-10 NOTE — Progress Notes (Addendum)
Pharmacy Antibiotic Note  Gregory [name] is a 62 y.o. male admitted on 01/04/2023 with VT storm and now suspected sepsis.  Pharmacy has been consulted for Zosyn and Vancomycin dosing. Patient has had low blood pressures (last measurement 77/58), with other vitals stable. WBC have been within normal limits, afebrile, and Cr increasing from 1.25 to 1.75 (bl~0.8-1). Patient has been imitated on levophed PIV and being transferred to [place]: Vancomycin '1750mg'$  x1 Vancomycin variable dosing until Cr stabilizes  Zosyn 3.375g IV q8h (4 hour infusion). Monitor kidney function and change in Cr Monitor for signs of clinical improvement, fever curve, WBC, and cultures  Height: '6\' 6"'$  (198.1 cm) Weight: 89.4 kg (197 lb 1.5 oz) IBW/kg (Calculated) : 91.4  Temp (24hrs), Avg:97.7 F (36.5 C), Min:97.3 F (36.3 C), Max:98.1 F (36.7 C)  Recent Labs  Lab 12/31/2022 0956 01/08/23 0429 01/09/23 0344 01/09/23 1039 01/09/23 1440 01/10/23 0118 01/10/23 0817  WBC 3.5*  --   --   --   --   --   --   CREATININE 1.22 1.19 1.25*  --   --  1.75*  --   LATICACIDVEN  --   --   --  1.9 2.0*  --  1.9    Estimated Creatinine Clearance: 53.9 mL/min (A) (by C-G formula based on SCr of 1.75 mg/dL (H)).    No Known Allergies  Antimicrobials this admission: Zosyn 1/27 >>  Vanc 1/27 >>   Microbiology results: 1/27 BCx:  Thank you for allowing pharmacy to be a part of this patient's care.  Sandford Craze, PharmD. [name] PGY-1  01/10/2023 11:12 AM   ADDENDUM Pharmacy has been consulted to dose cefepime for patient instead of zosyn. Patient has received zosyn dose at 1200. Will time cefepime to start when next zosyn dose was due  Plan Start cefepime 2g q12 hours (based on kidney function)

## 2023-01-10 NOTE — Consult Note (Signed)
ADVANCED HEART FAILURE CONSULT NOTE  HPI: Gregory [name] is a 62 y.o. male with colon cancer currently on Xeloda, HFrEF, CAD (CTO of the RCA, PCI to the LAD,diag,Lcx) atrial fibrillation on apixaban, OSA, CPAP admitted to the EP service for VT storm.  For the past several months Gregory [name] has been fairly ill.  He was admitted in late 2023 with cardiac arrest and has had recurrent episodes of ventricular tachycardia over the past 1 year.  He currently has advanced colon cancer and is currently on palliative chemotherapy.  According to Gregory [name] daughter for the past 1 week he has become increasingly weak, nauseous and bedbound.  For the past several days he has had minimal p.o. intake and has been unable to care for himself.  On the day of admission he was called by EP clinic due to VT on remote device interrogation.  He was loaded with IV amiodarone with improvement in VT burden.  This morning due to persistent hypotension, rise in serum creatinine and elevated LFTs with worsening encephalopathy heart failure was called to bedside.   Past Medical History:  Diagnosis Date   AICD (automatic cardioverter/defibrillator) present 2005   CAD (coronary artery disease) 12/01/2013   Cardiac arrest [facility])    Chronic combined systolic and diastolic CHF, NYHA class 1 ([place]) 12/01/2013   Erectile dysfunction 12/01/2013   HTN (hypertension) 12/01/2013   Hyperlipidemia 12/01/2013   Ischemic cardiomyopathy 12/01/2013   Presence of permanent cardiac pacemaker    Sleep apnea     Current Facility-Administered Medications  Medication Dose Route Frequency Provider Last Rate Last Admin   0.9 %  sodium chloride infusion  250 mL Intravenous Continuous Almyra Deforest, PA   Paused at 01/10/23 1138   0.9 %  sodium chloride infusion   Intravenous Continuous Gaylan Gerold, DO       acetaminophen (TYLENOL) tablet 650 mg  650 mg Oral Q4H PRN Baldwin Jamaica, PA-C   650 mg at 01/08/23 1811   amiodarone  (NEXTERONE PREMIX) 360-4.14 MG/200ML-% (1.8 mg/mL) IV infusion  30 mg/hr Intravenous Continuous Baldwin Jamaica, PA-C 16.67 mL/hr at 01/10/23 1200 30 mg/hr at 01/10/23 1200   apixaban (ELIQUIS) tablet 5 mg  5 mg Oral BID Baldwin Jamaica, PA-C   5 mg at 01/09/23 2248   calcium carbonate (TUMS - dosed in mg elemental calcium) chewable tablet 200 mg of elemental calcium  1 tablet Oral Daily Baldwin Jamaica, PA-C   200 mg of elemental calcium at 01/09/23 1008   ceFEPIme (MAXIPIME) 2 g in sodium chloride 0.9 % 100 mL IVPB  2 g Intravenous Q12H Sandford Craze, RPH       clopidogrel (PLAVIX) tablet 75 mg  75 mg Oral Daily Baldwin Jamaica, PA-C   75 mg at 01/09/23 1009   diclofenac Sodium (VOLTAREN) 1 % topical gel 2 g  2 g Topical Daily PRN Baldwin Jamaica, PA-C       ezetimibe (ZETIA) tablet 10 mg  10 mg Oral Daily Baldwin Jamaica, PA-C   10 mg at 01/09/23 1008   feeding supplement (ENSURE ENLIVE / ENSURE PLUS) liquid 237 mL  237 mL Oral TID BM Camnitz, Will Hassell Done, MD   237 mL at 01/09/23 2000   levETIRAcetam (KEPPRA) tablet 1,500 mg  1,500 mg Oral BID Baldwin Jamaica, PA-C   1,500 mg at 01/09/23 2248   levothyroxine (SYNTHROID) tablet 50 mcg  50 mcg Oral Q0600 Baldwin Jamaica, PA-C   50  mcg at 01/10/23 0530   loperamide (IMODIUM) capsule 2 mg  2 mg Oral PRN Doyne Keel., MD   2 mg at 01/09/23 1959   multivitamin with minerals tablet 1 tablet  1 tablet Oral Daily Camnitz, Will Hassell Done, MD       nitroGLYCERIN (NITROSTAT) SL tablet 0.4 mg  0.4 mg Sublingual Q5 Min x 3 PRN Baldwin Jamaica, PA-C       norepinephrine (LEVOPHED) '4mg'$  in 250m (0.016 mg/mL) premix infusion  2-10 mcg/min Intravenous Titrated MAlmyra Deforest PA 37.5 mL/hr at 01/10/23 1200 10 mcg/min at 01/10/23 1200   polyethylene glycol (MIRALAX / GLYCOLAX) packet 17 g  17 g Oral Daily PRN UBaldwin Jamaica PA-C       traZODone (DESYREL) tablet 50 mg  50 mg Oral QHS UBaldwin Jamaica PA-C   50 mg at 01/09/23 2254   vancomycin  (VANCOREADY) IVPB 1750 mg/350 mL  1,750 mg Intravenous Once WSandford Craze RPH       vancomycin variable dose per unstable renal function (pharmacist dosing)   Does not apply See admin instructions WSandford Craze RPH        No Known Allergies    Social History   Socioeconomic History   Marital status: Single    Spouse name: Not on file   Number of children: Not on file   Years of education: Not on file   Highest education level: Not on file  Occupational History   Not on file  Tobacco Use   Smoking status: Former    Packs/day: 1.00    Years: 30.00    Total pack years: 30.00    Types: Cigarettes    Quit date: 05/31/2013    Years since quitting: 9.6   Smokeless tobacco: Never  Vaping Use   Vaping Use: Never used  Substance and Sexual Activity   Alcohol use: Not Currently    Comment: 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, stopped in 03/2022   Drug use: Never   Sexual activity: Not Currently  Other Topics Concern   Not on file  Social History Narrative   Pt lies in split level home with his son, daughter and grand-son   Has 3 children   Some college education   Retired SScientist, forensic    Right handed   Social Determinants of Health   Financial Resource Strain: High Risk (05/06/2022)   Overall Financial Resource Strain (CARDIA)    Difficulty of Paying Living Expenses: Hard  Food Insecurity: No Food Insecurity (01/08/2023)   Hunger Vital Sign    Worried About Running Out of Food in the Last Year: Never true    Ran Out of Food in the Last Year: Never true  Transportation Needs: No Transportation Needs (01/08/2023)   PRAPARE - THydrologist(Medical): No    Lack of Transportation (Non-Medical): No  Physical Activity: Not on file  Stress: Not on file  Social Connections: Not on file  Intimate Partner Violence: Not At Risk (01/08/2023)   Humiliation, Afraid, Rape, and Kick questionnaire    Fear of Current or Ex-Partner: No     Emotionally Abused: No    Physically Abused: No    Sexually Abused: No      Family History  Problem Relation Age of Onset   Heart disease Mother    Brain cancer Father    Hypertension Daughter     PHYSICAL EXAM: Vitals:   01/10/23 1223 01/10/23 1303  BP: (!) 87/67 (!) 82/58  Pulse: 64   Resp: 16   Temp:    SpO2: 92%    GENERAL: Ill-appearing African-American male laying supine in bed.  Minimally responsive HEENT: Negative for arcus senilis or xanthelasma. There is no scleral icterus.  The mucous membranes are pink and moist.   NECK: Supple, No masses. Normal carotid upstrokes without bruits. No masses or thyromegaly.    CHEST: There are no chest wall deformities. There is no chest wall tenderness. Respirations are unlabored.  Lungs-decreased lung sounds CARDIAC:  JVP: Difficult to assess however does not appear elevated         Normal S1, S2  Normal rate with regular rhythm. No murmurs, rubs or gallops.  Pulses are 2+ and symmetrical in upper and lower extremities.  1-2+ edema due to anasarca ABDOMEN: Soft, non-tender, non-distended. There are no masses or hepatomegaly. There are normal bowel sounds.  EXTREMITIES: Warm and well perfused with no cyanosis, clubbing.  LYMPHATIC: No axillary or supraclavicular lymphadenopathy.  NEUROLOGIC: AAO x 0 PSYCH: Confused and lethargic SKIN: Warm and dry; no lesions or wounds.   DATA REVIEW  ECG: 01/08/2023: Ventricular trigeminy, normal sinus rhythm  ECHO: 01/10/2023: LVEF 35 to 40%, RV function mildly reduced, IVC less than 1 cm  CATH: 04/18/23: Widely patent left main Mid segment of LAD contains 40 to 50% narrowing.  The large first diagonal contains a patent stent in the mid body.  Proximal to the stent is a 50% stenosis. Circumflex is totally occluded beyond the origin of the large first obtuse marginal.  Left-to-right collaterals are noted.  The proximal circumflex before the origin of the first obtuse marginal contains 50 to 70%  stenosis.  There is progression of this lesion compared to prior. Right coronary is totally occluded proximally.  It reconstitutes in the distal segment.  PDA is totally occluded.  PDA and left ventricular branches fill via left to right collaterals.   ASSESSMENT & PLAN:  Vasoplegic/septic shock -On exam today, Gregory [name] hypotensive to 70s/50s; echocardiogram with IVC less than 1 cm and completely collapsible with inspiration.  LVEF stable at 35 to 40% on exam Gregory [name] warm.  Hemodynamics and clinical exam not consistent with cardiogenic shock. -Start empiric antibiotics with Vanco Zosyn.  Will need anaerobic coverage due to concern for gut translocation.  Gregory [name] with right upper quadrant tenderness on exam. -IV fluid bolus -Start Levophed -I had a very lengthy discussion with Gregory [name] daughter today.  She is very realistic with his overall health and long-term prognosis.  She is agreeable to making him with DNR/DNI.  The only advanced measures they would want at this time are cardioversion for ventricular tachycardia.  2.  Heart failure with reduced ejection fraction -Secondary to ischemic cardiomyopathy.  Current shock does not appear to be secondary to Xeloda induced cardiomyopathy. -Euvolemic on exam.  CCM following.  Monitor CVP and mixed venous once transferred to the unit.  3.  Coronary artery disease -Stable by left heart catheter 5/23.    4.  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 -Underwent resection on 04/18/2022 with pathology showing 6.4 cm moderate to poorly differentiated adenocarcinoma with positive lymph nodes and vascular invasion.  Has been on Xeloda since 07/21/2022 with PET scan in August 2023 demonstrating numerous hepatic and upper abdominal lymph nodes. -Until roughly 1 to 2 weeks ago Gregory [name] was performing ADLs independently however after completing his most recent cycle of Xeloda roughly 6  days ago his daughter  reports significant weakness, nausea, failure to thrive with minimal p.o. intake. -Although ventricular tachycardia could be secondary to Xeloda, his EKG is otherwise normal without ischemic changes.  I am doubtful that this is chemotherapy induced cardiomyopathy at this time.  Will continue supportive care over the next 24 to 48 hours with palliative care consult.  Family would like to discuss goals of care.  Huntley Demedeiros Advanced Heart Failure Mechanical Circulatory Support

## 2023-01-10 NOTE — Progress Notes (Signed)
IV watch alarmed with Levophed infusing.  Left posterior forearm with NO redness, flushes without difficulty with 5m NS, no c/o pain at site.  IV team consulted to verify position with ultrasound.

## 2023-01-10 NOTE — Progress Notes (Signed)
PCCM interval note  Reviewed patient's status, differential diagnosis, current therapies with his daughter [name] by phone.  Explained to her that he is on the maximum dose of norepinephrine that we use through peripheral IV.  Considered together the pros and cons of obtaining central access to uptitrate.  After discussion decision made to defer any escalation beyond norepinephrine 10, defer central line placement.  I did explain to Anisha that it is possible that her father's blood pressure will continue to decline and that he may pass.  She understands this.  Certainly we will notify her and family if he is declining so that they can come to the hospital.  Baltazar Apo, MD, PhD 01/10/2023, 6:18 PM [facility] [phone] or if no answer before 7:00PM call [phone] For any issues after 7:00PM please call eLink [phone]

## 2023-01-10 NOTE — Progress Notes (Signed)
An USGPIV (ultrasound guided PIV) has been placed for short-term vasopressor infusion. A correctly placed ivWatch must be used when administering Vasopressors. Should this treatment be needed beyond 72 hours, central line access should be obtained.  It will be the responsibility of the bedside nurse to follow best practice to prevent extravasations.  HS Lindley Hiney RN 

## 2023-01-10 NOTE — Progress Notes (Signed)
Rounding Note    Patient Name: Gregory [name] Date of Encounter: 01/10/2023  [place] Cardiologist: Sanda Klein, MD  Dr. Caryl Comes  Subjective   Weak and fatigued today, mildly less responsive.  Blood pressure has been low.  Creatinine mildly elevated.  Lactate not elevated.  Inpatient Medications    Scheduled Meds:  apixaban  5 mg Oral BID   atorvastatin  80 mg Oral QPM   calcium carbonate  1 tablet Oral Daily   clopidogrel  75 mg Oral Daily   dapagliflozin propanediol  10 mg Oral Daily   ezetimibe  10 mg Oral Daily   feeding supplement  237 mL Oral TID BM   levETIRAcetam  1,500 mg Oral BID   levothyroxine  50 mcg Oral Q0600   metoprolol succinate  50 mg Oral BID   multivitamin with minerals  1 tablet Oral Daily   sacubitril-valsartan  1 tablet Oral BID   traZODone  50 mg Oral QHS   Continuous Infusions:  sodium chloride     amiodarone 30 mg/hr (01/10/23 0957)   norepinephrine (LEVOPHED) Adult infusion     PRN Meds: acetaminophen, ALPRAZolam, diclofenac Sodium, loperamide, nitroGLYCERIN, polyethylene glycol   Vital Signs    Vitals:   01/10/23 0515 01/10/23 0743 01/10/23 0910 01/10/23 0930  BP: (!) 84/55 (!) 78/68 (!) 77/58   Pulse: 71 70 70 69  Resp: 17 20    Temp: 98.1 F (36.7 C) 98 F (36.7 C)    TempSrc: Axillary Oral    SpO2: 92% 92% (!) 88% 93%  Weight:      Height:        Intake/Output Summary (Last 24 hours) at 01/10/2023 1046 Last data filed at 01/10/2023 0400 Gross per 24 hour  Intake 921.48 ml  Output 1 ml  Net 920.48 ml       01/10/2023    5:00 AM 01/10/2023   12:01 AM 12/31/2022    9:12 AM  Last 3 Weights  Weight (lbs) 197 lb 1.5 oz 198 lb 13.7 oz 220 lb  Weight (kg) 89.4 kg 90.2 kg 99.791 kg      Telemetry    Sinus rhythm with occasional PVCs-personally reviewed  ECG    None new  Physical Exam   GEN: Thin and weak appearing HEENT: normal  Neck: no JVD, carotid bruits, or masses Cardiac: RRR; no  murmurs, rubs, or gallops,no edema  Respiratory:  clear to auscultation bilaterally, normal work of breathing GI: soft, nontender, nondistended, + BS MS: no deformity or atrophy  Skin: warm and dry, device site well healed Neuro:  Strength and sensation are intact Psych: euthymic mood, full affect   Labs    High Sensitivity Troponin:   Recent Labs  Lab 12/19/2022 0956 12/21/2022 1205  TROPONINIHS 18* 19*      Chemistry Recent Labs  Lab 01/10/2023 0956 01/08/23 0429 01/08/23 0645 01/09/23 0344 01/10/23 0118  NA 136 135  --  134* 134*  K 3.4* 3.4*  --  3.5 3.9  CL 109 107  --  107 109  CO2 17* 20*  --  20* 14*  GLUCOSE 125* 145*  --  151* 151*  BUN 27* 28*  --  31* 48*  CREATININE 1.22 1.19  --  1.25* 1.75*  CALCIUM 7.6* 7.7*  --  7.9* 7.9*  MG 1.7  --  2.5* 2.3  --   PROT 5.2* 5.1*  --   --   --   ALBUMIN 2.9* 2.8*  --   --   --  AST 193* 201*  --   --   --   ALT 221* 280*  --   --   --   ALKPHOS 68 65  --   --   --   BILITOT 1.0 0.9  --   --   --   GFRNONAA >60 >60  --  >60 43*  ANIONGAP 10 8  --  7 11     Lipids No results for input(s): "CHOL", "TRIG", "HDL", "LABVLDL", "LDLCALC", "CHOLHDL" in the last 168 hours.  Hematology Recent Labs  Lab 12/15/2022 0956  WBC 3.5*  RBC 4.64  HGB 16.4  HCT 47.1  MCV 101.5*  MCH 35.3*  MCHC 34.8  RDW 22.9*  PLT 221    Thyroid No results for input(s): "TSH", "FREET4" in the last 168 hours.  BNP Recent Labs  Lab 12/29/2022 0956  BNP 150.5*     DDimer No results for input(s): "DDIMER" in the last 168 hours.   Radiology    No results found.  Cardiac Studies   04/17/22: [facility] CONCLUSIONS: Widely patent left main Mid segment of LAD contains 40 to 50% narrowing.  The large first diagonal contains a patent stent in the mid body.  Proximal to the stent is a 50% stenosis. Circumflex is totally occluded beyond the origin of the large first obtuse marginal.  Left-to-right collaterals are noted.  The proximal circumflex before  the origin of the first obtuse marginal contains 50 to 70% stenosis.  There is progression of this lesion compared to prior. Right coronary is totally occluded proximally.  It reconstitutes in the distal segment.  PDA is totally occluded.  PDA and left ventricular branches fill via left to right collaterals. Recommendations: Per managing team. Anatomy is similar to prior with the following exceptions: 50% to 70% stenosis proximal to the large first obtuse marginal.  40 to 50% stenosis proximal to the stent segment in the first diagonal.     Echocardiogram 04/07/2022: Impressions:  1. Difficult acoustic windows Endocardium not well seen Overall there  does not appear to be a signfiicant change from echo in 2021.   2. LV function is depressed with hypokinesis of the lateral wall,  mid/distal inferior wall and apex; akinesis with aneurysmal dilitation of  inferior base. . Left ventricular ejection fraction, by estimation, is 35  to 40%. The left ventricle has  moderately decreased function. The left ventricular internal cavity size  was moderately dilated. There is mild left ventricular hypertrophy. Left  ventricular diastolic parameters are indeterminate.   3. Right ventricular systolic function is normal. The right ventricular  size is normal. There is mildly elevated pulmonary artery systolic  pressure.   4. Left atrial size was severely dilated.   5. Right atrial size was mildly dilated.   6. The mitral valve is normal in structure. Mild mitral valve  regurgitation.   7. The aortic valve is tricuspid. Aortic valve regurgitation is not  visualized. Aortic valve sclerosis/calcification is present, without any  evidence of aortic stenosis.   8. There is borderline dilatation of the aortic root, measuring 39 mm.  There is borderline dilatation of the ascending aorta, measuring 39 mm.   Patient Profile     62 y.o. male with CAD (known chronic occl of RCA with prior PCIs to LAD, diag, and  LCx), VT, ICM,  OSA w/CPAP, seizures, chronic CHF, HLD, HTN, obesity, AFib, h/o VT, PVCs admitted with VT storm  Device information Medtronic Dual Chamber ICD implanted 2005, 2010 and  gen change for 2018   2017 to have slow VT prompting reduction of his VT zone rate to 150bpm.   Oct 2021, VT with appropriate therapies   He had stopped drinking (heavily at least).  He noted previously had been on amiodarone and mexiletine , the mexiletine stopped 2/2 GI symptoms, and the amiodarone stopped 2/2 abn ALk phos   Oct 2021 > amio restarted for recurrent VT   Device interrogation ([facility])\presenting SR, PVCs Battery alnd lead numbers are good VP 0.2% OptiVol looks good 2 treated VT episode Total of 12 spins of ATP 1st episode ATP scheme was successful 2ns episode was not after all 9 ATPs delivered No shocks programmed in this zone (136-182,   Assessment & Plan    1.  VT storm: Fortunately remains in sinus rhythm.  Currently on IV amiodarone.  Gregory [name] continue IV amiodarone until a course of therapy has been determined.  No further VT in the last few days.  2.  Elevated LFTs: Can be caused by both Xeloda and his amiodarone.  At this point, there is no obvious alternative medication to treat his ventricular tachycardia.  Gregory [name] continue with current management.  3.  Coronary artery disease: No current angina.  No indication for ischemic evaluation.  4.  Chronic systolic heart failure: Not obviously volume overloaded.  Unfortunately his blood pressure has now started to go down with a and increasing creatinine.  Gregory [name] give him IV fluids and move him to the ICU.  He Gregory [name] need to be started on peripheral Levophed.  Gregory [name] consult heart failure cardiology for their input.  I am concerned overall about his general state with worsening heart failure, ventricular tachycardia and colon cancer.  I have discussed this with his family, and Gregory [name] call palliative care to see him for further plans of care.  I think  that possibly adjusting his goals of care to comfort measures would be a reasonable option.  5.  Paroxysmal atrial fibrillation: Continue Eliquis.  CHA2DS2-VASc of 4.  6.  Colon cancer: Has been seen by oncology in the hospital.  Plan for CT scan if possible during this hospitalization.   For questions or updates, please contact [facility] Please consult [email or web address] for contact info under        Signed, Gregory Mair Meredith Leeds, MD  01/10/2023, 10:46 AM

## 2023-01-10 NOTE — Sepsis Progress Note (Signed)
Elink following code sepsis

## 2023-01-10 NOTE — Progress Notes (Signed)
Patient's RN consulted for checking the end tip of catheter of USGPIV. Because IV watch machine was beeping. Checked the end of tip was in the vein. Showed it to Patient's RN with [place]. It's okay to infuse via this USGPIV access. HS [employer]

## 2023-01-10 NOTE — Progress Notes (Signed)
Patient's BP soft and ranged in 75O-HKG 67P systolic. Notified on call, Dr. Berna Spare, concerning patient's last BP 85/51, MAP 60. Patient denies any symptoms. PM metoprolol and Entresto held, per Dr. Humphrey Rolls, amio gtt continued. Will continue to monitor.

## 2023-01-10 NOTE — Consult Note (Signed)
Consultation Note Date: 01/10/2023   Patient Name: Gregory [name]  DOB: November 08, 1961  MRN: [id number]  Age / Sex: 62 y.o., male  PCP: Patient, No Pcp Per Referring Physician: Constance Haw, MD  Reason for Consultation:  "cardiogenic shock"  HPI/Patient Profile: 62 y.o. 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  He was started on amiodarone and was in sinus rhythm.  Today became lethargic, hypotensive.  Concerns for cardiogenic or septic shock.  Palliative medicine consulted for cardiogenic shock.  Primary Decision Maker NEXT OF KIN -daughter Jadarian Mckay  Discussion: I have reviewed medical records including Care Everywhere, progress notes from this and prior admissions, labs and imaging, discussed with RN.  On evaluation patient is awake but lethargic.  He is oriented to place.  He is not able to participate in in-depth goals of care due to his lethargy.  He is able to tell me that his daughter Vin Yonke is who he would want to make his decisions for him.  I called Anisha for further discussion.  I introduced Palliative Medicine as specialized medical care for people living with serious illness. It focuses on providing relief from the symptoms and stress of a serious illness. The goal is to improve quality of life for both the patient and the family.  We discussed a brief life review of the patient.  He is originally from [place].  He lived in [place] for a while he was in the [employer]  He worked for the [employer] and [employer].  He retired 20 years ago due to his heart condition and moved back to  [place] at that time to be close to his mother.  He has been living with [name] and she has been his primary caretaker.  He is known as the go to guy for the family always helpful and problem solving.  He finds joy in spending time with his family he has 3 grandchildren.  They enjoy Sunday dinners together.  As far as functional and nutritional status-he has had some decline in the past year, I Lucius Conn describes the past year as rough.  Prior to this admission he was able to ambulate in the home and outside the home with a walker.  He was independent for his ADLs.   We discussed patient's current illness and what it means in the larger context of patient's on-going co-morbidities.  Natural disease trajectory and expectations at EOL were discussed.  [name] is aware of the serious nature of Garrick's current illness.  She is contacting family and encouraging them to come visit him sooner than later.  While she remains hopeful for some improvement in his status, she is prepared for the fact that he may not recover.  Advance directives, concepts specific to code status, artificial  feeding and hydration, and rehospitalization were considered and discussed.  Patient has a DO NOT RESUSCITATE order in place.  He would not want to be put on a ventilator.  We discussed that patient may have some improvement from his current septic state, however, that will not improve his overall trajectory given the poor state of his heart.   Comfort measures were discussed. Discussed transition to comfort measures only which includes stopping IV fluids, antibiotics, labs and providing symptom management for SOB, anxiety, nausea, vomiting, and other symptoms of dying.    Discussed with patient/family the importance of continued conversation with family and the medical providers regarding overall plan of care and treatment options, ensuring decisions are within the context of the patient's values and GOCs.    Questions and  concerns were addressed. The family was encouraged to call with questions or concerns.      SUMMARY OF RECOMMENDATIONS -Continue current plan of care -Family wishes for DNR with full scope care for the next 24 to 48 hours -If patient has further decompensation or fails to show improvement then they would wish to transition to full comfort measures only  Code Status/Advance Care Planning: DNR   Prognosis:   Unable to determine  Discharge Planning: To Be Determined  Primary Diagnoses: Present on Admission:  VT (ventricular tachycardia) ([place])   Review of Systems  Unable to perform ROS: Mental status change    Physical Exam Vitals and nursing note reviewed.  Constitutional:      Appearance: He is ill-appearing.  Eyes:     Comments: L eye with orbital swelling  Skin:    General: Skin is warm and dry.  Neurological:     Mental Status: He is oriented to person, place, and time.     Comments: Lethargic     Vital Signs: BP (!) 79/62   Pulse 67   Temp (!) 97.4 F (36.3 C)   Resp 16   Ht '6\' 6"'$  (1.981 m)   Wt 89.4 kg   SpO2 97%   BMI 22.78 kg/m  Pain Scale: 0-10   Pain Score: 0-No pain   SpO2: SpO2: 97 % O2 Device:SpO2: 97 % O2 Flow Rate: .O2 Flow Rate (L/min): 6 L/min  IO: Intake/output summary:  Intake/Output Summary (Last 24 hours) at 01/10/2023 1547 Last data filed at 01/10/2023 1500 Gross per 24 hour  Intake 2831.18 ml  Output 1 ml  Net 2830.18 ml    LBM: Last BM Date : 01/10/23 Baseline Weight: Weight: 99.8 kg Most recent weight: Weight: 89.4 kg       Thank you for this consult. Palliative medicine will continue to follow and assist as needed.  Time Total: 90 minutes Greater than 50%  of this time was spent counseling and coordinating care related to the above assessment and plan.  Signed by: Mariana Kaufman, AGNP-C Palliative Medicine    Please contact Palliative Medicine Team phone at [phone] for questions and concerns.  For individual  provider: See Shea Evans

## 2023-01-10 NOTE — H&P (Deleted)
NAME:  Gregory [name], MRN:  [id number], DOB:  12/02/1961, LOS: 3 ADMISSION DATE:  12/15/2022, CONSULTATION DATE:  01/10/22 REFERRING MD:  Dr. Curt Bears, CHIEF COMPLAINT:  shock   History of Present Illness:  Gregory [name] is a 62 year old male living with colon cancer, CAD, HFrEF, A-fib on Eliquis, OSA on CPAP, seizure disorder, who was admitted to cardiology service for VT storm and started on amiodarone infusion.  His heart rate was well-controlled on IV amnio.  On day 3 of admission, patient was found to be hypotensive and more somnolent.  Blood pressure failed to improve after 5 cc of IV fluid so Levophed started.  Pertinent  Medical History   Past Medical History:  Diagnosis Date   AICD (automatic cardioverter/defibrillator) present 2005   CAD (coronary artery disease) 12/01/2013   Cardiac arrest ([place])    Chronic combined systolic and diastolic CHF, NYHA class 1 ([place]) 12/01/2013   Erectile dysfunction 12/01/2013   HTN (hypertension) 12/01/2013   Hyperlipidemia 12/01/2013   Ischemic cardiomyopathy 12/01/2013   Presence of permanent cardiac pacemaker    Sleep apnea      Significant Hospital Events: Including procedures, antibiotic start and stop dates in addition to other pertinent events   01/10/22 Levophed started  Interim History / Subjective:  Patient is seen at bedside with his daughter [name].  She states that patient has been more somnolent and less attractive in the last few day.  She states that he has very poor p.o. intake at home it may be worse here.  Patient denies fever but endorses chills.  He denies shortness of breath, chest pain, abdominal pain or dysuria.  Endorses mild nausea.  He has chronic back pain but no change in severity.  Daughter reports a blister in his groin area.  Objective   Blood pressure (!) 84/60, pulse 64, temperature 98 F (36.7 C), temperature source Oral, resp. rate 20, height '6\' 6"'$  (1.981 m), weight 89.4 kg, SpO2 91 %.         Intake/Output Summary (Last 24 hours) at 01/10/2023 1140 Last data filed at 01/10/2023 0400 Gross per 24 hour  Intake 659.04 ml  Output 1 ml  Net 658.04 ml   Filed Weights   12/20/2022 0912 01/10/23 0001 01/10/23 0500  Weight: 99.8 kg 90.2 kg 89.4 kg    Examination: General: Chronically ill elderly male.  Somnolent but awakes to verbal stimulation.  Oriented to person time and place HENT: Dried mucous membrane Lungs: No wheezing Cardiovascular: Regular rate and rhythm.  No obvious murmur heard.  No JVD.  No LE edema Abdomen: Soft, nontender.  Normal bowel sound heard Extremities: Normal range of motion.  +2 pulses palpated of all extremities. Neuro: Aox3.  Able to move all extremities GU: Flexi-Seal in place.  Yellow/brown stool seen tubes.  Resolved Hospital Problem list     Assessment & Plan:  Shock of unclear cause No evidence of cardiogenic shock on exam or on echocardiogram.  I suspect hypovolemic shock in the setting of poor p.o. eating and blood pressure/diuretic medications.  Possible septic shock but unclear source of infection.  No new consolidation seen on chest x-ray.  No evidence of infection on physical exam.  No signs of bleeding either. -Hold Entresto/spiro/BB/Lasix -s/p 1L NS.  Continue gentle hydration with IVF 100 cc/h x 10h to avoid pulmonary edema.  -Continue Levophed.  Wean as able.  May need a PICC line for pressors and CVP monitoring. -Pending blood culture, UA.  Continue Vanco and cefepime.  Avoid Zosyn for now given his AKI.  Cefepime does carry risk for encephalopathy so we will monitor closely. -Holding centrally acting medication such as Xanax.  AKI Metabolic acidosis 2/2 AKI and lactic acidosis Suspect prerenal.  No reliable in/out recorded in the last few days.  Patient using a urinal per RN. -Obtain renal ultrasound to rule out obstruction -Pending UA -Gentle IV fluid hydration -Trend lactic acid  Chronic HFrEF CAD (RCA total occlusion.  Mild  LAD disease) No sign of volume overload on exam.  Echocardiogram today shows unchanged EF. -Holding Entresto, spironolactone, Farxiga, beta-blocker and Lasix -Cardiology will consult heart failure team  VT storm Patient is now back to rate control. -Continue IV amiodarone for now.  Defer to cardiology about switching to p.o. amiodarone.  Atrial fibrillation -Eliquis for anticoagulation -IV amiodarone  Colon cancer Xeloda on hold her Oncology due to side effects of nausea, fatigue and possible cardiotoxicity.  He has a standing order for CT abdomen/pelvis. -Appreciate oncology recommendation  Transaminitis Has chronic transaminase elevations in the past.  This could be secondary to his Xeloda and statin combination.  LFT worsened today likely from his shock.  Normal total bilirubin not consistent with cholestasis picture.  If not improved, can consider RUQ [place] and hepatitis panel.  -Trend CMP daily -Hold statin  Seizure disorder Continue home Keppra  Hypothyroidism -Check TSH (TSH of 43 in 08/2022) -Continue levothyroxine 50 mcg  Hyperlipidemia -Continue Zetia -Hold statin  GOC Patient with multiple comorbidities including colon cancer and HFrEF.  Cardiology has consulted palliative care.  Confirmed with daughter and patient that he would like to be DNR.  He is okay with cardioversion/shock for his arrhythmia.  Best Practice (right click and "Reselect all SmartList Selections" daily)   Diet/type: Regular consistency (see orders) DVT prophylaxis: DOAC GI prophylaxis: N/A Lines: N/A Foley:  N/A Code Status:  DNR Last date of multidisciplinary goals of care discussion [1/27]  Labs   CBC: Recent Labs  Lab 01/04/2023 0956  WBC 3.5*  NEUTROABS 1.8  HGB 16.4  HCT 47.1  MCV 101.5*  PLT 938    Basic Metabolic Panel: Recent Labs  Lab 12/29/2022 0956 01/08/23 0429 01/08/23 0645 01/09/23 0344 01/10/23 0118  NA 136 135  --  134* 134*  K 3.4* 3.4*  --  3.5 3.9  CL 109  107  --  107 109  CO2 17* 20*  --  20* 14*  GLUCOSE 125* 145*  --  151* 151*  BUN 27* 28*  --  31* 48*  CREATININE 1.22 1.19  --  1.25* 1.75*  CALCIUM 7.6* 7.7*  --  7.9* 7.9*  MG 1.7  --  2.5* 2.3  --    GFR: Estimated Creatinine Clearance: 53.9 mL/min (A) (by C-G formula based on SCr of 1.75 mg/dL (H)). Recent Labs  Lab 12/31/2022 0956 01/09/23 1039 01/09/23 1440 01/10/23 0817  WBC 3.5*  --   --   --   LATICACIDVEN  --  1.9 2.0* 1.9    Liver Function Tests: Recent Labs  Lab 12/29/2022 0956 01/08/23 0429  AST 193* 201*  ALT 221* 280*  ALKPHOS 68 65  BILITOT 1.0 0.9  PROT 5.2* 5.1*  ALBUMIN 2.9* 2.8*   No results for input(s): "LIPASE", "AMYLASE" in the last 168 hours. No results for input(s): "AMMONIA" in the last 168 hours.  ABG    Component Value Date/Time   PHART 7.475 (H) 08/30/2022 2022   PCO2ART 28.7 (L) 08/30/2022 2022   PO2ART  117 (H) 08/30/2022 2022   HCO3 28.8 (H) 09/22/2022 1732   TCO2 31 09/22/2022 1733   ACIDBASEDEF 1.0 08/30/2022 2022   O2SAT 30 09/22/2022 1732     Coagulation Profile: No results for input(s): "INR", "PROTIME" in the last 168 hours.  Cardiac Enzymes: No results for input(s): "CKTOTAL", "CKMB", "CKMBINDEX", "TROPONINI" in the last 168 hours.  HbA1C: Hgb A1c MFr Bld  Date/Time Value Ref Range Status  09/01/2022 05:01 AM 6.3 (H) 4.8 - 5.6 % Final    Comment:    (NOTE) Pre diabetes:          5.7%-6.4%  Diabetes:              >6.4%  Glycemic control for   <7.0% adults with diabetes   05/08/2022 04:37 PM 5.1 4.8 - 5.6 % Final    Comment:    (NOTE) Pre diabetes:          5.7%-6.4%  Diabetes:              >6.4%  Glycemic control for   <7.0% adults with diabetes     CBG: No results for input(s): "GLUCAP" in the last 168 hours.  Review of Systems:     Past Medical History:  He,  has a past medical history of AICD (automatic cardioverter/defibrillator) present (2005), CAD (coronary artery disease) (12/01/2013),  Cardiac arrest [facility]), Chronic combined systolic and diastolic CHF, NYHA class 1 ([place]) (12/01/2013), Erectile dysfunction (12/01/2013), HTN (hypertension) (12/01/2013), Hyperlipidemia (12/01/2013), Ischemic cardiomyopathy (12/01/2013), Presence of permanent cardiac pacemaker, and Sleep apnea.   Surgical History:   Past Surgical History:  Procedure Laterality Date   ACHILLES TENDON REPAIR     lft foot   BIOPSY  04/16/2022   Procedure: BIOPSY;  Surgeon: Yetta Flock, MD;  Location: [place] ENDOSCOPY;  Service: Gastroenterology;;   CARDIAC CATHETERIZATION N/A 05/05/2016   Procedure: Left Heart Cath and Coronary Angiography;  Surgeon: Leonie Man, MD;  Location: [place] CV LAB;  Service: Cardiovascular;  Laterality: N/A;   CARDIAC DEFIBRILLATOR PLACEMENT     CARDIOVERSION N/A 09/13/2020   Procedure: CARDIOVERSION;  Surgeon: Werner Lean, MD;  Location: [place] ENDOSCOPY;  Service: Cardiovascular;  Laterality: N/A;   CARDIOVERSION N/A 09/19/2020   Procedure: CARDIOVERSION;  Surgeon: [name], MD;  Location: [facility] ENDOSCOPY;  Service: Cardiovascular;  Laterality: N/A;   COLON RESECTION N/A 04/18/2022   Procedure: HAND ASSISTED LAPAROSCOPIC RIGHT COLON RESECTION;  Surgeon: Felicie Morn, MD;  Location: [place];  Service: General;  Laterality: N/A;   COLONOSCOPY WITH PROPOFOL N/A 04/16/2022   Procedure: COLONOSCOPY WITH PROPOFOL;  Surgeon: Yetta Flock, MD;  Location: [place];  Service: Gastroenterology;  Laterality: N/A;   EP IMPLANTABLE DEVICE N/A 12/19/2016   Procedure: ICD Generator Changeout;  Surgeon: [name], MD;  Location: [place] CV LAB;  Service: Cardiovascular;  Laterality: N/A;   ESOPHAGOGASTRODUODENOSCOPY N/A 04/16/2022   Procedure: ESOPHAGOGASTRODUODENOSCOPY (EGD);  Surgeon: Yetta Flock, MD;  Location: [facility] ENDOSCOPY;  Service: Gastroenterology;  Laterality: N/A;   IR ANGIOGRAM SELECTIVE EACH ADDITIONAL VESSEL  05/09/2022   IR ANGIOGRAM  VISCERAL SELECTIVE  05/09/2022   IR CATHETER TUBE CHANGE  05/14/2022   IR CATHETER TUBE CHANGE  06/27/2022   IR EMBO ART  VEN HEMORR LYMPH EXTRAV  INC GUIDE ROADMAPPING  05/09/2022   IR IMAGE GUIDED DRAINAGE PERCUT CATH  PERITONEAL RETROPERIT  05/09/2022   IR PATIENT EVAL TECH 0-60 MINS  06/11/2022   IR RADIOLOGIST EVAL &  MGMT  06/26/2022   IR RADIOLOGIST EVAL & MGMT  07/16/2022   IR THORACENTESIS ASP PLEURAL SPACE W/IMG GUIDE  09/23/2022   IR US GUIDE VASC ACCESS RIGHT  05/09/2022   LEFT HEART CATH AND CORONARY ANGIOGRAPHY N/A 04/17/2022   Procedure: LEFT HEART CATH AND CORONARY ANGIOGRAPHY;  Surgeon: Belva Crome, MD;  Location: [place] CV LAB;  Service: Cardiovascular;  Laterality: N/A;   POLYPECTOMY  04/16/2022   Procedure: POLYPECTOMY;  Surgeon: Yetta Flock, MD;  Location[facility];  Service: Gastroenterology;;   RIGHT/LEFT HEART CATH AND CORONARY ANGIOGRAPHY N/A 09/24/2020   Procedure: RIGHT/LEFT HEART CATH AND CORONARY ANGIOGRAPHY;  Surgeon: Lorretta Harp, MD;  Location: [place] CV LAB;  Service: Cardiovascular;  Laterality: N/A;   SUBMUCOSAL TATTOO INJECTION  04/16/2022   Procedure: SUBMUCOSAL TATTOO INJECTION;  Surgeon: Yetta Flock, MD;  Location: [facility] ENDOSCOPY;  Service: Gastroenterology;;   WRIST TENODESIS       Social History:   reports that he quit smoking about 9 years ago. His smoking use included cigarettes. He has a 30.00 pack-year smoking history. He has never used smokeless tobacco. He reports that he does not currently use alcohol. He reports that he does not use drugs.   Family History:  His family history includes Brain cancer in his father; Heart disease in his mother; Hypertension in his daughter.   Allergies No Known Allergies   Home Medications  Prior to Admission medications   Medication Sig Start Date End Date Taking? Authorizing Provider  ALPRAZolam (XANAX) 0.25 MG tablet Take 1 tablet (0.25 mg total) by mouth at bedtime as needed for  anxiety or sleep. 12/22/22  Yes Heilingoetter, Cassandra L, PA-C  amiodarone (PACERONE) 200 MG tablet Take 2 tablets (400 mg total) by mouth daily. Please keep scheduled appointment for future refills. Thank you. 12/18/22  Yes [name], MD  apixaban (ELIQUIS) 5 MG TABS tablet Take 1 tablet (5 mg total) by mouth 2 (two) times daily. 10/22/22  Yes Croitoru, [name], MD  atorvastatin (LIPITOR) 80 MG tablet Take 1 tablet (80 mg total) by mouth every evening. Please schedule appointment for additional refills. 10/22/22  Yes Croitoru, [name], MD  clopidogrel (PLAVIX) 75 MG tablet Take 1 tablet (75 mg total) by mouth daily. Please schedule appointment for additional refills. 10/22/22  Yes Croitoru, [name], MD  dapagliflozin propanediol (FARXIGA) 10 MG TABS tablet Take 1 tablet (10 mg total) by mouth daily. Please contact the office to schedule appointment for additional refills. 10/22/22  Yes Croitoru, [name], MD  ezetimibe (ZETIA) 10 MG tablet Take 1 tablet (10 mg total) by mouth daily. Please contact the office to schedule appointment for additional refills. 10/22/22 01/20/23 Yes Croitoru, [name], MD  furosemide (LASIX) 40 MG tablet Take 1 tablet (40 mg total) by mouth daily. 10/17/22  Yes [name], MD  levETIRAcetam (KEPPRA) 750 MG tablet Take 2 tablets twice a day Patient taking differently: Take 750 mg by mouth 2 (two) times daily. 10/17/22  Yes [name], MD  levothyroxine (SYNTHROID) 50 MCG tablet Take 1 tablet (50 mcg total) by mouth daily at 6 (six) AM. 12/03/22  Yes [name], MD  metoprolol succinate (TOPROL-XL) 50 MG 24 hr tablet Take 1 tablet (50 mg total) by mouth 2 (two) times daily. Take with or immediately following a meal. Please schedule appointment for additional refills. 10/22/22  Yes Croitoru, [name], MD  naloxone [facility] nasal spray 4 mg/0.1 mL Use as needed in case of  overdose 09/18/22  Yes Love, Ivan Anchors, PA-C  potassium chloride (KLOR-CON M) 10 MEQ tablet Take 1 tablet (10 mEq  total) by mouth daily. 10/17/22  Yes [name], MD  sacubitril-valsartan (ENTRESTO) 49-51 MG Take 1 tablet by mouth 2 (two) times daily. Please contact the office to schedule appointment for additional refills. 10/22/22  Yes Croitoru, [name], MD  traZODone (DESYREL) 100 MG tablet Take 100 mg by mouth at bedtime as needed for sleep. 12/24/22  Yes [provider]  calcium carbonate (TUMS - DOSED IN MG ELEMENTAL CALCIUM) 500 MG chewable tablet Chew 1 tablet by mouth daily.    [provider]  capecitabine (XELODA) 500 MG tablet Take 4 tabs every 12 hours for 14 days then off for 7 days, take after a meal Patient not taking: Reported on 01/09/2023 10/17/22   [name], MD  traZODone (DESYREL) 50 MG tablet Take 1 tablet (50 mg total) by mouth at bedtime. Patient not taking: Reported on 01/09/2023 10/17/22   [name], MD     Critical care time:    Gaylan Gerold, DO Internal Medicine Residency

## 2023-01-10 NOTE — Progress Notes (Signed)
[name] Progress Note Patient Name: Gregory [name] DOB: 02/12/1961 MRN: [id number]   Date of Service  01/10/2023  HPI/Events of Note  Camera evaluation: for hypotension, scheduled for CT abdomen ( ordered by oncologist 2 days ago).  Discussed with RN. SBP 77, encephalopathic. DNR.   labs, meds reviewed. 62 y.o. male  with past medical history of coronary artery disease, A-fib, heart failure, ischemic cardiomyopathy, ICD in place, PEA cardiac arrest in September 2023-due to aspiration following seizure, stage IV colon cancer with mets to pancreas and liver, -has been on Xeloda admitted on 12/19/2022 with VT storm.  He was started on amiodarone and was in sinus rhythm.  Today became lethargic, hypotensive.  Concerns for cardiogenic or septic shock.    eICU Interventions  Discussed with daughter via phone. DNR re confirmed. Her sister is flying from [place] in AM, she will call her and update about current worsening multi organ failure and might not survive-make it till morning. Decision for a central line and pressors, bicarb to keep him going till sister arrival is pending now.   For now Not going to CT scan, DC ed No eliquis or keppra. NPO for now.      Intervention Category Intermediate Interventions: Communication with other healthcare providers and/or family;Hypotension - evaluation and management  Elmer Sow 01/10/2023, 8:02 PM

## 2023-01-10 NOTE — Progress Notes (Signed)
  Echocardiogram 2D Echocardiogram has been performed.  Gregory [name] 01/10/2023, 11:03 AM

## 2023-01-10 NOTE — Progress Notes (Signed)
Patient BP remains low, last 77/58, MAP 66. Patient drowsy, able to answer questions appropriately. Reports being "Tired" HAO M, PA-C on the unit notified. See new orders. Leveda Anna, BSN, RN

## 2023-01-10 NOTE — Sepsis Progress Note (Signed)
Reached out to pharmacy to ensure abx were timed within 1 hr window of started sepsis protocol,   Also reached out to bedside RN to provide any assistance. Lab is having a hard time getting BC so they are reaching out to MD to get a foot stick, and rapid response is there to assist. Encouraged bedside RN to start ABX as soon labs are drawn.   BP is soft and MD has already ordered levo. Some fluids have been given but not full recommended volume however pt has rising creatinine and has hx of heart failure with MD concern for worsening heart failure associated with the low BP.

## 2023-01-10 NOTE — Consult Note (Signed)
NAME:  Gregory [name], MRN:  [id number], DOB:  1961/04/30, LOS: 3 ADMISSION DATE:  12/30/2022, CONSULTATION DATE:  01/10/22 REFERRING MD:  Dr. Curt Bears, CHIEF COMPLAINT:  shock   History of Present Illness:  Gregory [name] is a 62 year old male living with colon cancer, CAD, HFrEF, A-fib on Eliquis, OSA on CPAP, seizure disorder, who was admitted to cardiology service for VT storm and started on amiodarone infusion.  His heart rate was well-controlled on IV amnio.  On day 3 of admission, patient was found to be hypotensive and more somnolent.  Blood pressure failed to improve after 5 cc of IV fluid so Levophed started.  Pertinent  Medical History   Past Medical History:  Diagnosis Date   AICD (automatic cardioverter/defibrillator) present 2005   CAD (coronary artery disease) 12/01/2013   Cardiac arrest ([place])    Chronic combined systolic and diastolic CHF, NYHA class 1 ([place]) 12/01/2013   Erectile dysfunction 12/01/2013   HTN (hypertension) 12/01/2013   Hyperlipidemia 12/01/2013   Ischemic cardiomyopathy 12/01/2013   Presence of permanent cardiac pacemaker    Sleep apnea      Significant Hospital Events: Including procedures, antibiotic start and stop dates in addition to other pertinent events   01/10/22 Levophed started  Interim History / Subjective:  Patient is seen at bedside with his daughter [name].  She states that patient has been more somnolent and less attractive in the last few day.  She states that he has very poor p.o. intake at home it may be worse here.  Patient denies fever but endorses chills.  He denies shortness of breath, chest pain, abdominal pain or dysuria.  Endorses mild nausea.  He has chronic back pain but no change in severity.  Daughter reports a blister in his groin area.  Objective   Blood pressure (!) 87/76, pulse 64, temperature (!) 97.2 F (36.2 C), temperature source Oral, resp. rate 16, height '6\' 6"'$  (1.981 m), weight 89.4 kg, SpO2 93 %.         Intake/Output Summary (Last 24 hours) at 01/10/2023 1415 Last data filed at 01/10/2023 1300 Gross per 24 hour  Intake 1651.46 ml  Output 1 ml  Net 1650.46 ml    Filed Weights   12/15/2022 0912 01/10/23 0001 01/10/23 0500  Weight: 99.8 kg 90.2 kg 89.4 kg    Examination: General: Chronically ill elderly male.  Somnolent but awakes to verbal stimulation.  Oriented to person time and place HENT: Dried mucous membrane Lungs: No wheezing Cardiovascular: Regular rate and rhythm.  No obvious murmur heard.  No JVD.  No LE edema Abdomen: Soft, nontender.  Normal bowel sound heard Extremities: Normal range of motion.  +2 pulses palpated of all extremities. Neuro: Aox3.  Able to move all extremities GU: Flexi-Seal in place.  Yellow/brown stool seen tubes.  Resolved Hospital Problem list     Assessment & Plan:  Shock of unclear cause No evidence of cardiogenic shock on exam or on echocardiogram.  I suspect hypovolemic shock in the setting of poor p.o. eating and blood pressure/diuretic medications.  Possible septic shock but unclear source of infection.  No new consolidation seen on chest x-ray.  No signs of bleeding either. -Hold Entresto/spiro/BB/Lasix -s/p 1L NS.  Continue gentle hydration with IVF 100 cc/h x 10h to avoid pulmonary edema.  -Continue Levophed.  Wean as able.  May need a PICC line for pressors and CVP monitoring. -Pending blood culture, UA.  Continue Vanco and cefepime.  Avoid Zosyn for now given  his AKI.  Cefepime does carry risk for encephalopathy so we will monitor closely. -Holding centrally acting medication such as Xanax.  AKI Metabolic acidosis 2/2 AKI and lactic acidosis Suspect prerenal.  No reliable in/out recorded in the last few days.  Patient using a urinal per RN. -Obtain renal ultrasound to rule out obstruction -Pending UA -Gentle IV fluid hydration -Trend lactic acid  Chronic HFrEF CAD (RCA total occlusion.  Mild LAD disease) No sign of volume  overload on exam.  Echocardiogram today shows unchanged EF. -Holding Entresto, spironolactone, Farxiga, beta-blocker and Lasix -Cardiology will consult heart failure team  VT storm Patient is now back to rate control. -Continue IV amiodarone for now.  Defer to cardiology about switching to p.o. amiodarone.  Atrial fibrillation -Eliquis for anticoagulation -IV amiodarone  Colon cancer Xeloda on hold her Oncology due to side effects of nausea, fatigue and possible cardiotoxicity.  He has a standing order for CT abdomen/pelvis. -Appreciate oncology recommendation  Transaminitis Has chronic transaminase elevations in the past.  This could be secondary to his Xeloda and statin combination.  LFT worsened today likely from his shock.  Normal total bilirubin not consistent with cholestasis picture.  If not improved, can consider RUQ [place] and hepatitis panel.  -Trend CMP daily -Hold statin  Seizure disorder Continue home Keppra  Hypothyroidism -Check TSH (TSH of 43 in 08/2022) -Continue levothyroxine 50 mcg  Hyperlipidemia -Continue Zetia -Hold statin  GOC Patient with multiple comorbidities including colon cancer and HFrEF.  Cardiology has consulted palliative care.  Confirmed with daughter and patient that he would like to be DNR.  He is okay with cardioversion/shock for his arrhythmia.  Best Practice (right click and "Reselect all SmartList Selections" daily)   Diet/type: Regular consistency (see orders) DVT prophylaxis: DOAC GI prophylaxis: N/A Lines: N/A Foley:  N/A Code Status:  DNR Last date of multidisciplinary goals of care discussion [1/27]  Labs   CBC: Recent Labs  Lab 12/31/2022 0956 01/10/23 1141  WBC 3.5* 12.5*  NEUTROABS 1.8 9.0*  HGB 16.4 16.3  HCT 47.1 47.5  MCV 101.5* 101.5*  PLT 221 243     Basic Metabolic Panel: Recent Labs  Lab 12/29/2022 0956 01/08/23 0429 01/08/23 0645 01/09/23 0344 01/10/23 0118 01/10/23 1141  NA 136 135  --  134* 134* 134*   K 3.4* 3.4*  --  3.5 3.9 3.9  CL 109 107  --  107 109 106  CO2 17* 20*  --  20* 14* 15*  GLUCOSE 125* 145*  --  151* 151* 145*  BUN 27* 28*  --  31* 48* 53*  CREATININE 1.22 1.19  --  1.25* 1.75* 2.11*  CALCIUM 7.6* 7.7*  --  7.9* 7.9* 7.8*  MG 1.7  --  2.5* 2.3  --   --     GFR: Estimated Creatinine Clearance: 44.7 mL/min (A) (by C-G formula based on SCr of 2.11 mg/dL (H)). Recent Labs  Lab 12/31/2022 0956 01/09/23 1039 01/09/23 1440 01/10/23 0817 01/10/23 1141  WBC 3.5*  --   --   --  12.5*  LATICACIDVEN  --  1.9 2.0* 1.9 2.6*     Liver Function Tests: Recent Labs  Lab 12/24/2022 0956 01/08/23 0429 01/10/23 1141  AST 193* 201* 214*  ALT 221* 280* 355*  ALKPHOS 68 65 81  BILITOT 1.0 0.9 0.8  PROT 5.2* 5.1* 4.7*  ALBUMIN 2.9* 2.8* 2.5*    No results for input(s): "LIPASE", "AMYLASE" in the last 168 hours. No results for  input(s): "AMMONIA" in the last 168 hours.  ABG    Component Value Date/Time   PHART 7.475 (H) 08/30/2022 2022   PCO2ART 28.7 (L) 08/30/2022 2022   PO2ART 117 (H) 08/30/2022 2022   HCO3 28.8 (H) 09/22/2022 1732   TCO2 31 09/22/2022 1733   ACIDBASEDEF 1.0 08/30/2022 2022   O2SAT 30 09/22/2022 1732     Coagulation Profile: No results for input(s): "INR", "PROTIME" in the last 168 hours.  Cardiac Enzymes: No results for input(s): "CKTOTAL", "CKMB", "CKMBINDEX", "TROPONINI" in the last 168 hours.  HbA1C: Hgb A1c MFr Bld  Date/Time Value Ref Range Status  09/01/2022 05:01 AM 6.3 (H) 4.8 - 5.6 % Final    Comment:    (NOTE) Pre diabetes:          5.7%-6.4%  Diabetes:              >6.4%  Glycemic control for   <7.0% adults with diabetes   05/08/2022 04:37 PM 5.1 4.8 - 5.6 % Final    Comment:    (NOTE) Pre diabetes:          5.7%-6.4%  Diabetes:              >6.4%  Glycemic control for   <7.0% adults with diabetes     CBG: No results for input(s): "GLUCAP" in the last 168 hours.  Review of Systems:     Past Medical  History:  He,  has a past medical history of AICD (automatic cardioverter/defibrillator) present (2005), CAD (coronary artery disease) (12/01/2013), Cardiac arrest [facility]), Chronic combined systolic and diastolic CHF, NYHA class 1 ([place]) (12/01/2013), Erectile dysfunction (12/01/2013), HTN (hypertension) (12/01/2013), Hyperlipidemia (12/01/2013), Ischemic cardiomyopathy (12/01/2013), Presence of permanent cardiac pacemaker, and Sleep apnea.   Surgical History:   Past Surgical History:  Procedure Laterality Date   ACHILLES TENDON REPAIR     lft foot   BIOPSY  04/16/2022   Procedure: BIOPSY;  Surgeon: Yetta Flock, MD;  Location: [place] ENDOSCOPY;  Service: Gastroenterology;;   CARDIAC CATHETERIZATION N/A 05/05/2016   Procedure: Left Heart Cath and Coronary Angiography;  Surgeon: Leonie Man, MD;  Location: [place] CV LAB;  Service: Cardiovascular;  Laterality: N/A;   CARDIAC DEFIBRILLATOR PLACEMENT     CARDIOVERSION N/A 09/13/2020   Procedure: CARDIOVERSION;  Surgeon: Werner Lean, MD;  Location: [place] ENDOSCOPY;  Service: Cardiovascular;  Laterality: N/A;   CARDIOVERSION N/A 09/19/2020   Procedure: CARDIOVERSION;  Surgeon: [name], MD;  Location: [facility] ENDOSCOPY;  Service: Cardiovascular;  Laterality: N/A;   COLON RESECTION N/A 04/18/2022   Procedure: HAND ASSISTED LAPAROSCOPIC RIGHT COLON RESECTION;  Surgeon: Felicie Morn, MD;  Location: [place];  Service: General;  Laterality: N/A;   COLONOSCOPY WITH PROPOFOL N/A 04/16/2022   Procedure: COLONOSCOPY WITH PROPOFOL;  Surgeon: Yetta Flock, MD;  Location: [place];  Service: Gastroenterology;  Laterality: N/A;   EP IMPLANTABLE DEVICE N/A 12/19/2016   Procedure: ICD Generator Changeout;  Surgeon: [name], MD;  Location: [place] CV LAB;  Service: Cardiovascular;  Laterality: N/A;   ESOPHAGOGASTRODUODENOSCOPY N/A 04/16/2022   Procedure: ESOPHAGOGASTRODUODENOSCOPY (EGD);  Surgeon: Yetta Flock, MD;   Location: [facility] ENDOSCOPY;  Service: Gastroenterology;  Laterality: N/A;   IR ANGIOGRAM SELECTIVE EACH ADDITIONAL VESSEL  05/09/2022   IR ANGIOGRAM VISCERAL SELECTIVE  05/09/2022   IR CATHETER TUBE CHANGE  05/14/2022   IR CATHETER TUBE CHANGE  06/27/2022   IR EMBO ART  VEN HEMORR LYMPH EXTRAV  INC GUIDE  ROADMAPPING  05/09/2022   IR IMAGE GUIDED DRAINAGE PERCUT CATH  PERITONEAL RETROPERIT  05/09/2022   IR PATIENT EVAL TECH 0-60 MINS  06/11/2022   IR RADIOLOGIST EVAL & MGMT  06/26/2022   IR RADIOLOGIST EVAL & MGMT  07/16/2022   IR THORACENTESIS ASP PLEURAL SPACE W/IMG GUIDE  09/23/2022   IR US GUIDE VASC ACCESS RIGHT  05/09/2022   LEFT HEART CATH AND CORONARY ANGIOGRAPHY N/A 04/17/2022   Procedure: LEFT HEART CATH AND CORONARY ANGIOGRAPHY;  Surgeon: Belva Crome, MD;  Location: [place] CV LAB;  Service: Cardiovascular;  Laterality: N/A;   POLYPECTOMY  04/16/2022   Procedure: POLYPECTOMY;  Surgeon: Yetta Flock, MD;  Location: [facility] ENDOSCOPY;  Service: Gastroenterology;;   RIGHT/LEFT HEART CATH AND CORONARY ANGIOGRAPHY N/A 09/24/2020   Procedure: RIGHT/LEFT HEART CATH AND CORONARY ANGIOGRAPHY;  Surgeon: Lorretta Harp, MD;  Location: [place] CV LAB;  Service: Cardiovascular;  Laterality: N/A;   SUBMUCOSAL TATTOO INJECTION  04/16/2022   Procedure: SUBMUCOSAL TATTOO INJECTION;  Surgeon: Yetta Flock, MD;  Location: [facility] ENDOSCOPY;  Service: Gastroenterology;;   WRIST TENODESIS       Social History:   reports that he quit smoking about 9 years ago. His smoking use included cigarettes. He has a 30.00 pack-year smoking history. He has never used smokeless tobacco. He reports that he does not currently use alcohol. He reports that he does not use drugs.   Family History:  His family history includes Brain cancer in his father; Heart disease in his mother; Hypertension in his daughter.   Allergies No Known Allergies   Home Medications  Prior to Admission medications   Medication Sig  Start Date End Date Taking? Authorizing Provider  ALPRAZolam (XANAX) 0.25 MG tablet Take 1 tablet (0.25 mg total) by mouth at bedtime as needed for anxiety or sleep. 12/22/22  Yes Heilingoetter, Cassandra L, PA-C  amiodarone (PACERONE) 200 MG tablet Take 2 tablets (400 mg total) by mouth daily. Please keep scheduled appointment for future refills. Thank you. 12/18/22  Yes [name], MD  apixaban (ELIQUIS) 5 MG TABS tablet Take 1 tablet (5 mg total) by mouth 2 (two) times daily. 10/22/22  Yes Croitoru, [name], MD  atorvastatin (LIPITOR) 80 MG tablet Take 1 tablet (80 mg total) by mouth every evening. Please schedule appointment for additional refills. 10/22/22  Yes Croitoru, [name], MD  clopidogrel (PLAVIX) 75 MG tablet Take 1 tablet (75 mg total) by mouth daily. Please schedule appointment for additional refills. 10/22/22  Yes Croitoru, [name], MD  dapagliflozin propanediol (FARXIGA) 10 MG TABS tablet Take 1 tablet (10 mg total) by mouth daily. Please contact the office to schedule appointment for additional refills. 10/22/22  Yes Croitoru, [name], MD  ezetimibe (ZETIA) 10 MG tablet Take 1 tablet (10 mg total) by mouth daily. Please contact the office to schedule appointment for additional refills. 10/22/22 01/20/23 Yes Croitoru, [name], MD  furosemide (LASIX) 40 MG tablet Take 1 tablet (40 mg total) by mouth daily. 10/17/22  Yes [name], MD  levETIRAcetam (KEPPRA) 750 MG tablet Take 2 tablets twice a day Patient taking differently: Take 750 mg by mouth 2 (two) times daily. 10/17/22  Yes [name], MD  levothyroxine (SYNTHROID) 50 MCG tablet Take 1 tablet (50 mcg total) by mouth daily at 6 (six) AM. 12/03/22  Yes [name], MD  metoprolol succinate (TOPROL-XL) 50 MG 24 hr tablet Take 1 tablet (50 mg total) by mouth 2 (two) times daily. Take  with or immediately following a meal. Please schedule appointment for additional refills. 10/22/22  Yes Croitoru, [name], MD  naloxone [facility]) nasal spray 4  mg/0.1 mL Use as needed in case of overdose 09/18/22  Yes Love, Ivan Anchors, PA-C  potassium chloride (KLOR-CON M) 10 MEQ tablet Take 1 tablet (10 mEq total) by mouth daily. 10/17/22  Yes [name], MD  sacubitril-valsartan (ENTRESTO) 49-51 MG Take 1 tablet by mouth 2 (two) times daily. Please contact the office to schedule appointment for additional refills. 10/22/22  Yes Croitoru, [name], MD  traZODone (DESYREL) 100 MG tablet Take 100 mg by mouth at bedtime as needed for sleep. 12/24/22  Yes [provider]  calcium carbonate (TUMS - DOSED IN MG ELEMENTAL CALCIUM) 500 MG chewable tablet Chew 1 tablet by mouth daily.    [provider]  capecitabine (XELODA) 500 MG tablet Take 4 tabs every 12 hours for 14 days then off for 7 days, take after a meal Patient not taking: Reported on 01/09/2023 10/17/22   [name], MD  traZODone (DESYREL) 50 MG tablet Take 1 tablet (50 mg total) by mouth at bedtime. Patient not taking: Reported on 01/09/2023 10/17/22   [name], MD     Critical care time:    Gaylan Gerold, DO Internal Medicine Residency

## 2023-01-11 DIAGNOSIS — I472 Ventricular tachycardia, unspecified: Secondary | ICD-10-CM | POA: Diagnosis not present

## 2023-01-11 DIAGNOSIS — R578 Other shock: Secondary | ICD-10-CM | POA: Diagnosis not present

## 2023-01-11 DIAGNOSIS — I509 Heart failure, unspecified: Secondary | ICD-10-CM | POA: Diagnosis not present

## 2023-01-11 LAB — URINALYSIS, ROUTINE W REFLEX MICROSCOPIC
Bilirubin Urine: NEGATIVE
Glucose, UA: 50 mg/dL — AB
Ketones, ur: NEGATIVE mg/dL
Leukocytes,Ua: NEGATIVE
Nitrite: NEGATIVE
Protein, ur: 100 mg/dL — AB
Specific Gravity, Urine: 1.025 (ref 1.005–1.030)
pH: 5 (ref 5.0–8.0)

## 2023-01-11 MED ORDER — LEVETIRACETAM IN NACL 1000 MG/100ML IV SOLN
1000.0000 mg | Freq: Two times a day (BID) | INTRAVENOUS | Status: DC
  Administered 2023-01-11 – 2023-01-12 (×3): 1000 mg via INTRAVENOUS
  Filled 2023-01-11 (×3): qty 100

## 2023-01-11 MED ORDER — AMIODARONE LOAD VIA INFUSION
150.0000 mg | Freq: Once | INTRAVENOUS | Status: AC
  Administered 2023-01-11: 150 mg via INTRAVENOUS
  Filled 2023-01-11: qty 83.34

## 2023-01-11 MED ORDER — SODIUM CHLORIDE 0.9 % IV SOLN: INTRAVENOUS | Status: AC

## 2023-01-11 MED ORDER — VANCOMYCIN HCL 1750 MG/350ML IV SOLN
1750.0000 mg | Freq: Once | INTRAVENOUS | Status: AC
  Administered 2023-01-11: 1750 mg via INTRAVENOUS
  Filled 2023-01-11: qty 350

## 2023-01-11 MED ORDER — ALBUMIN HUMAN 5 % IV SOLN
12.5000 g | Freq: Once | INTRAVENOUS | Status: AC
  Administered 2023-01-11: 12.5 g via INTRAVENOUS
  Filled 2023-01-11: qty 250

## 2023-01-11 MED ORDER — GERHARDT'S BUTT CREAM
TOPICAL_CREAM | Freq: Two times a day (BID) | CUTANEOUS | Status: DC
  Filled 2023-01-11: qty 1

## 2023-01-11 MED ORDER — LEVETIRACETAM 100 MG/ML PO SOLN
1500.0000 mg | Freq: Two times a day (BID) | ORAL | Status: DC
  Administered 2023-01-11: 1500 mg via ORAL
  Filled 2023-01-11 (×2): qty 15

## 2023-01-11 NOTE — Progress Notes (Signed)
Discussed with [name] (pt's daughter) re:  blood draws on her dad.  Anisha requests that no more than 2 sticks for labs.  Dr Curt Bears aware.  Pediatric tubes to be used for blood draws.

## 2023-01-11 NOTE — Progress Notes (Signed)
[facility] Progress Note Patient Name: Gregory [name] DOB: 1961-07-30 MRN: [id number]   Date of Service  01/11/2023  HPI/Events of Note  Notified of urinary retention of >400cc on bladder scan.   eICU Interventions  Straight cath ordered.     Intervention Category Intermediate Interventions: Other:  Elsie Lincoln 01/11/2023, 5:55 AM

## 2023-01-11 NOTE — Progress Notes (Signed)
Pharmacy Antibiotic Note  Gregory [name] is a 62 y.o. male admitted on 12/15/2022 with VT storm and now suspected sepsis.  Pharmacy has been consulted for Zosyn and Vancomycin dosing. Patient has experienced hypotension requiring vasopressors. WBC had been increasing with most recent 12.5, afebrile, and Cr increasing from 1.25 to 2.1 (bl~0.8-1).   Initial consult placed yesterday and done by PharmD on the floor. Vancomycin was never started. Will reorder vanc load as outlined previously.  Plan: Vancomycin '1750mg'$  x1 Vancomycin variable dosing until Cr stabilizes  Vancomycin random 1/29 at 1000 Cefepime 2g every 12 hours Monitor kidney function and change in Cr Monitor for signs of clinical improvement, fever curve, WBC, and cultures  Height: '6\' 6"'$  (198.1 cm) Weight: 88.1 kg (194 lb 3.6 oz) IBW/kg (Calculated) : 91.4  Temp (24hrs), Avg:97.5 F (36.4 C), Min:97.2 F (36.2 C), Max:97.6 F (36.4 C)  Recent Labs  Lab 01/05/2023 0956 01/08/23 0429 01/09/23 0344 01/09/23 1039 01/09/23 1440 01/10/23 0118 01/10/23 0817 01/10/23 1141 01/10/23 1630  WBC 3.5*  --   --   --   --   --   --  12.5*  --   CREATININE 1.22 1.19 1.25*  --   --  1.75*  --  2.11*  --   LATICACIDVEN  --   --   --  1.9 2.0*  --  1.9 2.6* 1.7     Estimated Creatinine Clearance: 44.1 mL/min (A) (by C-G formula based on SCr of 2.11 mg/dL (H)).    No Known Allergies  Antimicrobials this admission: Cefepime 1/27> Vanc 1/28 >>   Microbiology results: 1/27 BCx: NGTD  Thank you for involving pharmacy in this patient's care.  Reatha Harps, PharmD PGY2 Pharmacy Resident 01/11/2023 10:47 AM

## 2023-01-11 NOTE — Consult Note (Signed)
ADVANCED HEART FAILURE CONSULT NOTE  HPI: Gregory [name] is a 62 y.o. male with colon cancer currently on Xeloda, HFrEF, CAD (CTO of the RCA, PCI to the LAD,diag,Lcx) atrial fibrillation on apixaban, OSA, CPAP admitted to the EP service for VT storm.  For the past several months Gregory [name] has been fairly ill.  He was admitted in late 2023 with cardiac arrest and has had recurrent episodes of ventricular tachycardia over the past 1 year.  He currently has advanced colon cancer and is currently on palliative chemotherapy.  According to Gregory [name] daughter for the past 1 week he has become increasingly weak, nauseous and bedbound.  For the past several days he has had minimal p.o. intake and has been unable to care for himself.  On the day of admission he was called by EP clinic due to VT on remote device interrogation.  He was loaded with IV amiodarone with improvement in VT burden.  This morning due to persistent hypotension, rise in serum creatinine and elevated LFTs with worsening encephalopathy heart failure was called to bedside.  Interval hx:  - On levophed 46mg - MAP 60-65 with cuff pressures in low 80s.  - Increasingly alert this AM.   PHYSICAL EXAM: Vitals:   01/11/23 0915 01/11/23 0930  BP:    Pulse: 78 75  Resp: 19 18  Temp:    SpO2: 98% 91%   GENERAL: Ill-appearing African-American male laying supine in bed.  Minimally responsive HEENT: Negative for arcus senilis or xanthelasma. There is no scleral icterus.  The mucous membranes are pink and moist.   NECK: Supple, No masses. Normal carotid upstrokes without bruits. No masses or thyromegaly.    CHEST: There are no chest wall deformities. There is no chest wall tenderness. Respirations are unlabored.  Lungs-decreased lung sounds CARDIAC:  JVP <7, no murmurs. 1+ pitting edema in b/L lower extremities.  ABDOMEN: Soft, non-tender, non-distended. There are no masses or hepatomegaly. There are normal bowel sounds.   EXTREMITIES: Warm and well perfused with no cyanosis, clubbing.  LYMPHATIC: No axillary or supraclavicular lymphadenopathy.  NEUROLOGIC: AAO x 0 PSYCH: Confused and lethargic SKIN: Warm and dry; no lesions or wounds.   DATA REVIEW  ECG: 01/08/2023: Ventricular trigeminy, normal sinus rhythm  ECHO: 01/10/2023: LVEF 35 to 40%, RV function mildly reduced, IVC less than 1 cm  CATH: 04/18/23: Widely patent left main Mid segment of LAD contains 40 to 50% narrowing.  The large first diagonal contains a patent stent in the mid body.  Proximal to the stent is a 50% stenosis. Circumflex is totally occluded beyond the origin of the large first obtuse marginal.  Left-to-right collaterals are noted.  The proximal circumflex before the origin of the first obtuse marginal contains 50 to 70% stenosis.  There is progression of this lesion compared to prior. Right coronary is totally occluded proximally.  It reconstitutes in the distal segment.  PDA is totally occluded.  PDA and left ventricular branches fill via left to right collaterals.   ASSESSMENT & PLAN:  Vasoplegic/septic shock -On exam 01/10/23, patient hypotensive to 70s/50s; echocardiogram with IVC less than 1 cm and completely collapsible with inspiration.  LVEF stable at 35 to 40% on exam patient warm.  Hemodynamics and clinical exam not consistent with cardiogenic shock. -I had a very lengthy discussion with Mr. WArizmendidaughter yesterday.  She is very realistic with his overall health and long-term prognosis.  She is agreeable to making him with DNR/DNI.  The only  advanced measures they would want at this time are cardioversion for ventricular tachycardia. Does not want CVC.  - This AM, 1/28, patient remains hypotensive on levophed 8mg; BP via cuff. Bedside U/S with IVC 0.7cm and collapsing with inspiration. Continue IVF resuscitation.  - Did not receive vancomycin yesterday; start today.   2.  Heart failure with reduced ejection  fraction -Secondary to ischemic cardiomyopathy.  Current shock does not appear to be secondary to Xeloda induced cardiomyopathy. -Hypovolemic on exam. See above.   3.  Coronary artery disease -Stable by left heart catheter 5/23.    4.  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 -Underwent resection on 04/18/2022 with pathology showing 6.4 cm moderate to poorly differentiated adenocarcinoma with positive lymph nodes and vascular invasion.  Has been on Xeloda since 07/21/2022 with PET scan in August 2023 demonstrating numerous hepatic and upper abdominal lymph nodes. -Until roughly 1 to 2 weeks ago patient was performing ADLs independently however after completing his most recent cycle of Xeloda roughly 6 days ago his daughter reports significant weakness, nausea, failure to thrive with minimal p.o. intake. -Although ventricular tachycardia could be secondary to Xeloda, his EKG is otherwise normal without ischemic changes.  I am doubtful that this is chemotherapy induced cardiomyopathy at this time.  Will continue supportive care over the next 24 to 48 hours with palliative care consult.  Family would like to discuss goals of care.  Gregory [name] Advanced Heart Failure Mechanical Circulatory Support  CRITICAL CARE Performed by: AHebert Soho  Total critical care time: 40 minutes  Critical care time was exclusive of separately billable procedures and treating other patients.  Critical care was necessary to treat or prevent imminent or life-threatening deterioration.  Critical care was time spent personally by me on the following activities: development of treatment plan with patient and/or surrogate as well as nursing, discussions with consultants, evaluation of patient's response to treatment, examination of patient, obtaining history from patient or surrogate, ordering and performing treatments and interventions, ordering and review  of laboratory studies, ordering and review of radiographic studies, pulse oximetry and re-evaluation of patient's condition.

## 2023-01-11 NOTE — Plan of Care (Signed)
  Problem: Coping: Goal: Level of anxiety will decrease Outcome: Progressing   Problem: Pain Managment: Goal: General experience of comfort will improve Outcome: Progressing   Problem: Safety: Goal: Ability to remain free from injury will improve Outcome: Progressing   Problem: Skin Integrity: Goal: Risk for impaired skin integrity will decrease Outcome: Progressing   

## 2023-01-11 NOTE — Progress Notes (Signed)
Rounding Note    Patient Name: Gregory [name] Date of Encounter: 01/11/2023  [place] Cardiologist: Sanda Klein, MD  Dr. Caryl Comes  Subjective   Moved to the ICU yesterday due to persistent hypotension.  On Levophed peripherally.  Family does not wish to place a central line.  Unable to get labs due to lack of access.  Was found to be dehydrated and currently getting IV fluids.  Feeling weak and fatigued.  Inpatient Medications    Scheduled Meds:  apixaban  5 mg Oral BID   calcium carbonate  1 tablet Oral Daily   Chlorhexidine Gluconate Cloth  6 each Topical Daily   clopidogrel  75 mg Oral Daily   ezetimibe  10 mg Oral Daily   feeding supplement  237 mL Oral TID BM   levETIRAcetam  1,500 mg Oral BID   levothyroxine  50 mcg Oral Q0600   multivitamin with minerals  1 tablet Oral Daily   traZODone  50 mg Oral QHS   vancomycin variable dose per unstable renal function (pharmacist dosing)   Does not apply See admin instructions   Continuous Infusions:  sodium chloride 250 mL (01/11/23 0951)   amiodarone 30 mg/hr (01/11/23 0949)   ceFEPime (MAXIPIME) IV Stopped (01/11/23 6295)   norepinephrine (LEVOPHED) Adult infusion 10 mcg/min (01/11/23 0900)   vancomycin     PRN Meds: acetaminophen, diclofenac Sodium, loperamide, nitroGLYCERIN, polyethylene glycol   Vital Signs    Vitals:   01/11/23 0845 01/11/23 0900 01/11/23 0915 01/11/23 0930  BP:  (!) 71/54    Pulse: 73 76 78 75  Resp: '16 16 19 18  '$ Temp:      TempSrc:      SpO2: 95% 94% 98% 91%  Weight:      Height:        Intake/Output Summary (Last 24 hours) at 01/11/2023 1116 Last data filed at 01/11/2023 0900 Gross per 24 hour  Intake 3678.49 ml  Output 600 ml  Net 3078.49 ml       01/11/2023    5:00 AM 01/10/2023    5:00 AM 01/10/2023   12:01 AM  Last 3 Weights  Weight (lbs) 194 lb 3.6 oz 197 lb 1.5 oz 198 lb 13.7 oz  Weight (kg) 88.1 kg 89.4 kg 90.2 kg      Telemetry    Sinus rhythm  with PVCs-personally reviewed  ECG    None new  Physical Exam   GEN: Weak and chronically ill-appearing HEENT: normal  Neck: no JVD, carotid bruits, or masses Cardiac: RRR; no murmurs, rubs, or gallops,no edema  Respiratory:  clear to auscultation bilaterally, normal work of breathing GI: soft, nontender, nondistended, + BS MS: no deformity or atrophy  Skin: warm and dry, device site well healed Neuro:  Strength and sensation are intact Psych: euthymic mood, full affect   Labs    High Sensitivity Troponin:   Recent Labs  Lab 01/04/2023 0956 12/25/2022 1205  TROPONINIHS 18* 19*      Chemistry Recent Labs  Lab 12/18/2022 0956 01/08/23 0429 01/08/23 0645 01/09/23 0344 01/10/23 0118 01/10/23 1141  NA 136 135  --  134* 134* 134*  K 3.4* 3.4*  --  3.5 3.9 3.9  CL 109 107  --  107 109 106  CO2 17* 20*  --  20* 14* 15*  GLUCOSE 125* 145*  --  151* 151* 145*  BUN 27* 28*  --  31* 48* 53*  CREATININE 1.22 1.19  --  1.25* 1.75* 2.11*  CALCIUM 7.6* 7.7*  --  7.9* 7.9* 7.8*  MG 1.7  --  2.5* 2.3  --   --   PROT 5.2* 5.1*  --   --   --  4.7*  ALBUMIN 2.9* 2.8*  --   --   --  2.5*  AST 193* 201*  --   --   --  214*  ALT 221* 280*  --   --   --  355*  ALKPHOS 68 65  --   --   --  81  BILITOT 1.0 0.9  --   --   --  0.8  GFRNONAA >60 >60  --  >60 43* 34*  ANIONGAP 10 8  --  '7 11 13     '$ Lipids No results for input(s): "CHOL", "TRIG", "HDL", "LABVLDL", "LDLCALC", "CHOLHDL" in the last 168 hours.  Hematology Recent Labs  Lab 12/20/2022 0956 01/10/23 1141  WBC 3.5* 12.5*  RBC 4.64 4.68  HGB 16.4 16.3  HCT 47.1 47.5  MCV 101.5* 101.5*  MCH 35.3* 34.8*  MCHC 34.8 34.3  RDW 22.9* 24.2*  PLT 221 243    Thyroid  Recent Labs  Lab 01/10/23 1642  TSH 36.751*    BNP Recent Labs  Lab 12/15/2022 0956  BNP 150.5*     DDimer No results for input(s): "DDIMER" in the last 168 hours.   Radiology    US RENAL  Result Date: 01/10/2023 CLINICAL DATA:  Acute renal  insufficiency EXAM: RENAL / URINARY TRACT ULTRASOUND COMPLETE COMPARISON:  None Available. FINDINGS: Right Kidney: Renal measurements: 12.2 x 5.6 x 6.0 cm = volume: 209 mL. Demonstrates a 2.3 cm upper pole cyst. No follow-up imaging recommended for the cyst. Left Kidney: Renal measurements: 11 x 7 x 6.7 cm = volume: 268 mL. Echogenicity within normal limits. No mass or hydronephrosis visualized. Bladder: Appears normal for degree of bladder distention. Other: None. IMPRESSION: No significant abnormalities. No cause for acute renal insufficiency identified. Electronically Signed   By: Dorise Bullion III M.D.   On: 01/10/2023 13:02   DG Chest Port 1 View  Result Date: 01/10/2023 CLINICAL DATA:  Sepsis EXAM: PORTABLE CHEST 1 VIEW COMPARISON:  One-view chest x-ray 01/10/2023 FINDINGS: Heart is enlarged.  Pacing and defibrillator wires are stable. Linear density at the left base is slightly improved, likely reflecting atelectasis. Lung volumes remain low. No other focal airspace disease is present. IMPRESSION: 1. Cardiomegaly without failure. 2. Slight improvement in left basilar atelectasis. Electronically Signed   By: San Morelle M.D.   On: 01/10/2023 11:38   ECHOCARDIOGRAM COMPLETE  Result Date: 01/10/2023    ECHOCARDIOGRAM REPORT   Patient Name:   Gregory [name] Date of Exam: 01/10/2023 Medical Rec #:  [id number]                 Height:       78.0 in Accession #:    [id number]                Weight:       197.1 lb Date of Birth:  June 06, 1961                 BSA:          2.243 m Patient Age:    62 years                  BP:           77/58 mmHg Patient  Gender: M                         HR:           68 bpm. Exam Location:  Inpatient Procedure: 2D Echo STAT ECHO Indications:    dyspnea. V-Tach.  History:        Patient has prior history of Echocardiogram examinations, most                 recent 09/05/2022. Cardiomyopathy, Defibrillator; Risk                 Factors:Hypertension, Dyslipidemia,  Diabetes, Sleep Apnea and                 Former Smoker.  Sonographer:    Johny Chess RDCS Referring Phys: [phone] HAO MENG  Sonographer Comments: Technically difficult study due to poor echo windows. Image acquisition challenging due to respiratory motion. IMPRESSIONS  1. Left ventricular ejection fraction, by estimation, is 35 to 40%. The left ventricle has moderately decreased function. The left ventricle demonstrates regional wall motion abnormalities (see scoring diagram/findings for description). The left ventricular internal cavity size was mildly to moderately dilated. Left ventricular diastolic parameters are consistent with Grade I diastolic dysfunction (impaired relaxation).  2. Right ventricular systolic function is mildly reduced. The right ventricular size is normal.  3. Left atrial size was mildly dilated.  4. A small pericardial effusion is present. The pericardial effusion is circumferential. There is no evidence of cardiac tamponade.  5. The mitral valve is normal in structure. No evidence of mitral valve regurgitation.  6. The aortic valve is tricuspid. Aortic valve regurgitation is not visualized. Aortic valve sclerosis/calcification is present, without any evidence of aortic stenosis.  7. The inferior vena cava is normal in size with greater than 50% respiratory variability, suggesting right atrial pressure of 3 mmHg. FINDINGS  Left Ventricle: Left ventricular ejection fraction, by estimation, is 35 to 40%. The left ventricle has moderately decreased function. The left ventricle demonstrates regional wall motion abnormalities. The left ventricular internal cavity size was mildly to moderately dilated. There is no left ventricular hypertrophy. Left ventricular diastolic parameters are consistent with Grade I diastolic dysfunction (impaired relaxation). Normal left ventricular filling pressure.  LV Wall Scoring: The antero-lateral wall, inferior wall, and posterior wall are akinetic. Right  Ventricle: The right ventricular size is normal. No increase in right ventricular wall thickness. Right ventricular systolic function is mildly reduced. Left Atrium: Left atrial size was mildly dilated. Right Atrium: Right atrial size was normal in size. Pericardium: A small pericardial effusion is present. The pericardial effusion is circumferential. There is no evidence of cardiac tamponade. Mitral Valve: The mitral valve is normal in structure. No evidence of mitral valve regurgitation. Tricuspid Valve: The tricuspid valve is grossly normal. Tricuspid valve regurgitation is not demonstrated. Aortic Valve: The aortic valve is tricuspid. Aortic valve regurgitation is not visualized. Aortic valve sclerosis/calcification is present, without any evidence of aortic stenosis. Pulmonic Valve: The pulmonic valve was not well visualized. Pulmonic valve regurgitation is not visualized. Aorta: The aortic root was not well visualized. Venous: The inferior vena cava is normal in size with greater than 50% respiratory variability, suggesting right atrial pressure of 3 mmHg. IAS/Shunts: No atrial level shunt detected by color flow Doppler. Additional Comments: A device lead is visualized in the right ventricle.  LEFT VENTRICLE PLAX 2D LVIDd:         6.10 cm  Diastology LVIDs:         5.10 cm      LV e' medial:    5.87 cm/s LV PW:         1.10 cm      LV E/e' medial:  6.1 LV IVS:        1.10 cm      LV e' lateral:   5.00 cm/s LVOT diam:     2.00 cm      LV E/e' lateral: 7.2 LV SV:         27 LV SV Index:   12 LVOT Area:     3.14 cm  LV Volumes (MOD) LV vol d, MOD A2C: 103.0 ml LV vol s, MOD A2C: 65.7 ml LV SV MOD A2C:     37.3 ml RIGHT VENTRICLE            IVC RV S prime:     8.38 cm/s  IVC diam: 0.80 cm TAPSE (M-mode): 1.8 cm LEFT ATRIUM           Index        RIGHT ATRIUM           Index LA diam:      3.70 cm 1.65 cm/m   RA Area:     11.00 cm LA Vol (A4C): 75.1 ml 33.47 ml/m  RA Volume:   19.70 ml  8.78 ml/m  AORTIC  VALVE LVOT Vmax:   49.00 cm/s LVOT Vmean:  31.700 cm/s LVOT VTI:    0.086 m MITRAL VALVE MV Area (PHT): 1.84 cm    SHUNTS MV Decel Time: 412 msec    Systemic VTI:  0.09 m MV E velocity: 35.90 cm/s  Systemic Diam: 2.00 cm MV A velocity: 37.20 cm/s MV E/A ratio:  0.97 [name] Croitoru MD Electronically signed by Sanda Klein MD Signature Date/Time: 01/10/2023/11:13:18 AM    Final     Cardiac Studies   04/17/22: [facility] CONCLUSIONS: Widely patent left main Mid segment of LAD contains 40 to 50% narrowing.  The large first diagonal contains a patent stent in the mid body.  Proximal to the stent is a 50% stenosis. Circumflex is totally occluded beyond the origin of the large first obtuse marginal.  Left-to-right collaterals are noted.  The proximal circumflex before the origin of the first obtuse marginal contains 50 to 70% stenosis.  There is progression of this lesion compared to prior. Right coronary is totally occluded proximally.  It reconstitutes in the distal segment.  PDA is totally occluded.  PDA and left ventricular branches fill via left to right collaterals. Recommendations: Per managing team. Anatomy is similar to prior with the following exceptions: 50% to 70% stenosis proximal to the large first obtuse marginal.  40 to 50% stenosis proximal to the stent segment in the first diagonal.     Echocardiogram 04/07/2022: Impressions:  1. Difficult acoustic windows Endocardium not well seen Overall there  does not appear to be a signfiicant change from echo in 2021.   2. LV function is depressed with hypokinesis of the lateral wall,  mid/distal inferior wall and apex; akinesis with aneurysmal dilitation of  inferior base. . Left ventricular ejection fraction, by estimation, is 35  to 40%. The left ventricle has  moderately decreased function. The left ventricular internal cavity size  was moderately dilated. There is mild left ventricular hypertrophy. Left  ventricular diastolic parameters are  indeterminate.   3. Right ventricular systolic function is normal. The right ventricular  size is normal. There is mildly elevated pulmonary artery systolic  pressure.   4. Left atrial size was severely dilated.   5. Right atrial size was mildly dilated.   6. The mitral valve is normal in structure. Mild mitral valve  regurgitation.   7. The aortic valve is tricuspid. Aortic valve regurgitation is not  visualized. Aortic valve sclerosis/calcification is present, without any  evidence of aortic stenosis.   8. There is borderline dilatation of the aortic root, measuring 39 mm.  There is borderline dilatation of the ascending aorta, measuring 39 mm.   Patient Profile     62 y.o. male with CAD (known chronic occl of RCA with prior PCIs to LAD, diag, and LCx), VT, ICM,  OSA w/CPAP, seizures, chronic CHF, HLD, HTN, obesity, AFib, h/o VT, PVCs admitted with VT storm  Device information Medtronic Dual Chamber ICD implanted 2005, 2010 and gen change for 2018   2017 to have slow VT prompting reduction of his VT zone rate to 150bpm.   Oct 2021, VT with appropriate therapies   He had stopped drinking (heavily at least).  He noted previously had been on amiodarone and mexiletine , the mexiletine stopped 2/2 GI symptoms, and the amiodarone stopped 2/2 abn ALk phos   Oct 2021 > amio restarted for recurrent VT   Device interrogation ([facility])\presenting SR, PVCs Battery alnd lead numbers are good VP 0.2% OptiVol looks good 2 treated VT episode Total of 12 spins of ATP 1st episode ATP scheme was successful 2ns episode was not after all 9 ATPs delivered No shocks programmed in this zone (136-182,   Assessment & Plan    1.  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  Continue IV amiodarone.  No further VT over the last few days.  Elevated LFTs: Can be caused by both Xeloda and amiodarone.  At this point no alternative medication to treat VT.  3.  Coronary artery disease: No current chest pain.  No  indication for ischemic evaluation.  4.  Chronic systolic heart failure: No obvious volume overload.  Recent echo shows dehydration with low filling pressures.  Has been getting IV fluids.  5.  Paroxysmal atrial fibrillation: Continue Eliquis.  CHA2DS2-VASc of 4.  6.  Colon cancer: Has been followed by oncology in the hospital.  7.  Vasoplegic/possible septic shock: Currently on antibiotics.  Concern is that this is more of a failure to thrive picture.  Multiple discussions have been had with the patient's family, who are realistic to the overall state of his health.    For questions or updates, please contact Susan Moore Please consult [email or web address] for contact info under        Signed, Cerenity Goshorn Meredith Leeds, MD  01/11/2023, 11:16 AM

## 2023-01-11 NOTE — Consult Note (Signed)
NAME:  Gregory [name], MRN:  [id number], DOB:  October 25, 1961, LOS: 4 ADMISSION DATE:  12/31/2022, CONSULTATION DATE:  01/10/22 REFERRING MD:  Dr. Curt Bears, CHIEF COMPLAINT:  shock   History of Present Illness:  Gregory [name] is a 62 year old male living with colon cancer, CAD, HFrEF, A-fib on Eliquis, OSA on CPAP, seizure disorder, who was admitted to cardiology service for VT storm and started on amiodarone infusion.  His heart rate was well-controlled on IV amnio.  On day 3 of admission, patient was found to be hypotensive and more somnolent.  Blood pressure failed to improve after 5 cc of IV fluid so Levophed started.  Pertinent  Medical History   Past Medical History:  Diagnosis Date   AICD (automatic cardioverter/defibrillator) present 2005   CAD (coronary artery disease) 12/01/2013   Cardiac arrest ([place])    Chronic combined systolic and diastolic CHF, NYHA class 1 ([place]) 12/01/2013   Erectile dysfunction 12/01/2013   HTN (hypertension) 12/01/2013   Hyperlipidemia 12/01/2013   Ischemic cardiomyopathy 12/01/2013   Presence of permanent cardiac pacemaker    Sleep apnea      Significant Hospital Events: Including procedures, antibiotic start and stop dates in addition to other pertinent events   01/10/22 Levophed started  Interim History / Subjective:  Stable vasopressor requirements overnight. BP responded well to fluids yesterday.   Objective   Blood pressure (!) 71/54, pulse 75, temperature (!) 97.5 F (36.4 C), temperature source Oral, resp. rate 18, height '6\' 6"'$  (1.981 m), weight 88.1 kg, SpO2 91 %.        Intake/Output Summary (Last 24 hours) at 01/11/2023 1345 Last data filed at 01/11/2023 0900 Gross per 24 hour  Intake 2507.4 ml  Output 600 ml  Net 1907.4 ml    Filed Weights   01/10/23 0001 01/10/23 0500 01/11/23 0500  Weight: 90.2 kg 89.4 kg 88.1 kg   Examination: General: Chronically ill elderly male.  Somnolent but awakes to verbal stimulation.   Oriented to person time and place HENT: Dry mucous membranes Lungs: Clear bilaterally  Cardiovascular: Regular rate and rhythm.  No obvious murmur heard.  JVP flat.  No LE edema Abdomen: Soft, nontender.  Normal bowel sound heard Extremities: Normal range of motion.  +2 pulses palpated of all extremities. Neuro: Aox3.  Able to move all extremities GU: Flexi-Seal in place.  Yellow/brown stool seen tubes.  Ancillary tests personally reviewed  Creatinine rising Lactate cleared yesterday.   Assessment & Plan:  Critically ill due to Shock likely multifactorial including hypovolemic and cardiogenic etiologies as well as the presence of vasodilatory medications.  No clear source of infection. AKI Metabolic acidosis 2/2 AKI and lactic acidosis Chronic HFrEF CAD (RCA total occlusion.  Mild LAD disease) VT storm Atrial fibrillation Colon cancer on palliative chemotherapy. Transaminitis-chronic. Seizure disorder Hypothyroidism Hyperlipidemia GOC  Plan:  -Continue fluid resuscitation. -Titrate norepinephrine to keep MAP greater than 65.  Thinks maximum of 10 mcg/min via peripheral access.  Family does not wish escalation to central line. -Continue empiric antibiotics.  Cultures are pending. -Hold Entresto/spiro/BB/Lasix -Holding centrally acting medication such as Xanax. -Continue IV amiodarone for now.  Defer to cardiology about switching to p.o. amiodarone -Xeloda on hold  due to side effects of nausea, fatigue and possible cardiotoxicity.   -Monitor LFTs -Continue levothyroxine 50 mcg -Palliative care is now following.  DNR per conversations.  Some of his limitations in care have already been put in place.  Have declined central line or intubation.  Awaiting further family  to arrive.  Best Practice (right click and "Reselect all SmartList Selections" daily)   Diet/type: Regular consistency (see orders) DVT prophylaxis: DOAC GI prophylaxis: N/A Lines: N/A Foley:  N/A Code  Status:  DNR Last date of multidisciplinary goals of care discussion [1/27]  CRITICAL CARE Performed by: Kipp Brood   Total critical care time: 35 minutes  Critical care time was exclusive of separately billable procedures and treating other patients.  Critical care was necessary to treat or prevent imminent or life-threatening deterioration.  Critical care was time spent personally by me on the following activities: development of treatment plan with patient and/or surrogate as well as nursing, discussions with consultants, evaluation of patient's response to treatment, examination of patient, obtaining history from patient or surrogate, ordering and performing treatments and interventions, ordering and review of laboratory studies, ordering and review of radiographic studies, pulse oximetry, re-evaluation of patient's condition and participation in multidisciplinary rounds.  Kipp Brood, MD [facility] [place] ICU Physician [place]  Pager: [phone] Mobile: [phone] After hours: [phone].

## 2023-01-12 ENCOUNTER — Inpatient Hospital Stay (HOSPITAL_COMMUNITY): Payer: Commercial Managed Care - HMO

## 2023-01-12 DIAGNOSIS — R579 Shock, unspecified: Secondary | ICD-10-CM | POA: Diagnosis not present

## 2023-01-12 DIAGNOSIS — Z515 Encounter for palliative care: Secondary | ICD-10-CM

## 2023-01-12 DIAGNOSIS — Z7189 Other specified counseling: Secondary | ICD-10-CM | POA: Diagnosis not present

## 2023-01-12 DIAGNOSIS — I472 Ventricular tachycardia, unspecified: Secondary | ICD-10-CM | POA: Diagnosis not present

## 2023-01-12 LAB — COMPREHENSIVE METABOLIC PANEL
ALT: 217 U/L — ABNORMAL HIGH (ref 0–44)
AST: 122 U/L — ABNORMAL HIGH (ref 15–41)
Albumin: UNDETERMINED g/dL (ref 3.5–5.0)
Alkaline Phosphatase: 95 U/L (ref 38–126)
Anion gap: 15 (ref 5–15)
BUN: 57 mg/dL — ABNORMAL HIGH (ref 8–23)
CO2: 10 mmol/L — ABNORMAL LOW (ref 22–32)
Calcium: 7.3 mg/dL — ABNORMAL LOW (ref 8.9–10.3)
Chloride: 108 mmol/L (ref 98–111)
Creatinine, Ser: UNDETERMINED mg/dL (ref 0.61–1.24)
Glucose, Bld: UNDETERMINED mg/dL (ref 70–99)
Potassium: 3.3 mmol/L — ABNORMAL LOW (ref 3.5–5.1)
Sodium: 133 mmol/L — ABNORMAL LOW (ref 135–145)
Total Bilirubin: 0.7 mg/dL (ref 0.3–1.2)
Total Protein: UNDETERMINED g/dL (ref 6.5–8.1)

## 2023-01-12 LAB — CBC
HCT: 46 % (ref 39.0–52.0)
Hemoglobin: 16.1 g/dL (ref 13.0–17.0)
MCH: 34.8 pg — ABNORMAL HIGH (ref 26.0–34.0)
MCHC: 35 g/dL (ref 30.0–36.0)
MCV: 99.6 fL (ref 80.0–100.0)
Platelets: 174 10*3/uL (ref 150–400)
RBC: 4.62 MIL/uL (ref 4.22–5.81)
RDW: 24.2 % — ABNORMAL HIGH (ref 11.5–15.5)
WBC: 10.4 10*3/uL (ref 4.0–10.5)
nRBC: 0.3 % — ABNORMAL HIGH (ref 0.0–0.2)

## 2023-01-12 LAB — MAGNESIUM: Magnesium: 1.9 mg/dL (ref 1.7–2.4)

## 2023-01-12 LAB — BASIC METABOLIC PANEL
Anion gap: 13 (ref 5–15)
BUN: 56 mg/dL — ABNORMAL HIGH (ref 8–23)
CO2: 12 mmol/L — ABNORMAL LOW (ref 22–32)
Calcium: 7.1 mg/dL — ABNORMAL LOW (ref 8.9–10.3)
Chloride: 105 mmol/L (ref 98–111)
Creatinine, Ser: 2.46 mg/dL — ABNORMAL HIGH (ref 0.61–1.24)
GFR, Estimated: 29 mL/min — ABNORMAL LOW (ref >60)
Glucose, Bld: 258 mg/dL — ABNORMAL HIGH (ref 70–99)
Potassium: 2.8 mmol/L — ABNORMAL LOW (ref 3.5–5.1)
Sodium: 130 mmol/L — ABNORMAL LOW (ref 135–145)

## 2023-01-12 MED ORDER — POTASSIUM CHLORIDE 10 MEQ/100ML IV SOLN
10.0000 meq | INTRAVENOUS | Status: AC
  Administered 2023-01-12 (×4): 10 meq via INTRAVENOUS
  Filled 2023-01-12: qty 100

## 2023-01-12 MED ORDER — HALOPERIDOL LACTATE 2 MG/ML PO CONC: 0.5000 mg | ORAL | Status: DC | PRN

## 2023-01-12 MED ORDER — HALOPERIDOL LACTATE 5 MG/ML IJ SOLN: 0.5000 mg | INTRAMUSCULAR | Status: DC | PRN

## 2023-01-12 MED ORDER — ONDANSETRON HCL 4 MG/2ML IJ SOLN: 4.0000 mg | Freq: Four times a day (QID) | INTRAMUSCULAR | Status: DC | PRN

## 2023-01-12 MED ORDER — ONDANSETRON 4 MG PO TBDP: 4.0000 mg | ORAL_TABLET | Freq: Four times a day (QID) | ORAL | Status: DC | PRN

## 2023-01-12 MED ORDER — LORAZEPAM 2 MG/ML IJ SOLN
1.0000 mg | INTRAMUSCULAR | Status: DC | PRN
  Administered 2023-01-12: 1 mg via INTRAVENOUS
  Filled 2023-01-12: qty 1

## 2023-01-12 MED ORDER — HYDROMORPHONE HCL 1 MG/ML IJ SOLN
0.5000 mg | INTRAMUSCULAR | Status: DC | PRN
  Administered 2023-01-12: 1 mg via INTRAVENOUS
  Administered 2023-01-12 – 2023-01-13 (×2): 0.5 mg via INTRAVENOUS
  Filled 2023-01-12 (×3): qty 1

## 2023-01-12 MED ORDER — BIOTENE DRY MOUTH MT LIQD: 15.0000 mL | OROMUCOSAL | Status: DC | PRN

## 2023-01-12 MED ORDER — GLYCOPYRROLATE 0.2 MG/ML IJ SOLN
0.4000 mg | INTRAMUSCULAR | Status: DC
  Administered 2023-01-12 – 2023-01-13 (×3): 0.4 mg via INTRAVENOUS
  Filled 2023-01-12 (×3): qty 2

## 2023-01-12 MED ORDER — ACETAMINOPHEN 325 MG PO TABS: 650.0000 mg | ORAL_TABLET | Freq: Four times a day (QID) | ORAL | Status: DC | PRN

## 2023-01-12 MED ORDER — HALOPERIDOL 0.5 MG PO TABS: 0.5000 mg | ORAL_TABLET | ORAL | Status: DC | PRN

## 2023-01-12 MED ORDER — POLYVINYL ALCOHOL 1.4 % OP SOLN: 1.0000 [drp] | Freq: Four times a day (QID) | OPHTHALMIC | Status: DC | PRN

## 2023-01-12 MED ORDER — ACETAMINOPHEN 650 MG RE SUPP: 650.0000 mg | Freq: Four times a day (QID) | RECTAL | Status: DC | PRN

## 2023-01-12 MED ORDER — FENTANYL CITRATE PF 50 MCG/ML IJ SOSY
25.0000 ug | PREFILLED_SYRINGE | INTRAMUSCULAR | Status: DC | PRN
  Administered 2023-01-12 (×2): 50 ug via INTRAVENOUS
  Filled 2023-01-12 (×2): qty 1

## 2023-01-12 NOTE — Progress Notes (Signed)
   NAME:  Gregory [name], MRN:  [id number], DOB:  October 14, 1961, LOS: 5 ADMISSION DATE:  01/02/2023, CONSULTATION DATE:  01/10/22 REFERRING MD:  Dr. Curt Bears, CHIEF COMPLAINT:  shock   History of Present Illness:  Gregory [name] is a 62 year old male living with colon cancer, CAD, HFrEF, A-fib on Eliquis, OSA on CPAP, seizure disorder, who was admitted to cardiology service for VT storm and started on amiodarone infusion.  His heart rate was well-controlled on IV amnio.  On day 3 of admission, patient was found to be hypotensive and more somnolent.  Blood pressure failed to improve after 5 cc of IV fluid so Levophed started.  Pertinent  Medical History   Past Medical History:  Diagnosis Date   AICD (automatic cardioverter/defibrillator) present 2005   CAD (coronary artery disease) 12/01/2013   Cardiac arrest ([place])    Chronic combined systolic and diastolic CHF, NYHA class 1 ([place]) 12/01/2013   Erectile dysfunction 12/01/2013   HTN (hypertension) 12/01/2013   Hyperlipidemia 12/01/2013   Ischemic cardiomyopathy 12/01/2013   Presence of permanent cardiac pacemaker    Sleep apnea      Significant Hospital Events: Including procedures, antibiotic start and stop dates in addition to other pertinent events   01/10/22 Levophed started  Interim History / Subjective:  Weak Denies pain other than abd.  Objective   Blood pressure (!) 88/51, pulse 70, temperature 98 F (36.7 C), temperature source Oral, resp. rate 18, height '6\' 6"'$  (1.981 m), weight 95.1 kg, SpO2 93 %.        Intake/Output Summary (Last 24 hours) at 01/12/2023 0819 Last data filed at 01/12/2023 6256 Gross per 24 hour  Intake 2086.3 ml  Output 975 ml  Net 1111.3 ml    Filed Weights   01/10/23 0500 01/11/23 0500 01/12/23 0322  Weight: 89.4 kg 88.1 kg 95.1 kg   Examination: Chronically ill man in NAD Abd mildly ttp Lungs with crackles +anasarca Weak but following commands  K low being repleted Very tough  stick Renal function worsening  Ancillary tests personally reviewed  Creatinine rising Lactate cleared yesterday.   Assessment & Plan:  Ongoing shock- family does not want central access, seems to be mixed picture distributive and cardiogenic.  Being treated empirically for sepsis.  Cannot check cortisol again due to tough stick and family not wanting too much blood draws. Multifactorial renal failure- worsening VT storm- improved, still lots of ectopy Afib on AC PTA Hypothyroidism, Seizure disorder, HLD, CAD, HFrEF- on PTA meds that don't affect BP (see orders) Stage IV colon cancer- on palliative xeloda PTA  Plan: - Will take over as primary - Antiarrythmics per EP - Pressors as ordered - Plavix, eliquis - Broad spectrum abx for now Has been worsening since Hosp day 1, will ask family to come in and discuss where to go from here  Best Practice (right click and "Reselect all SmartList Selections" daily)   Diet/type: Regular consistency (see orders) DVT prophylaxis: DOAC GI prophylaxis: N/A Lines: N/A Foley:  N/A Code Status:  DNR Last date of multidisciplinary goals of care discussion [1/27]   32 min cc time Erskine Emery MD PCCM

## 2023-01-12 NOTE — Progress Notes (Addendum)
Electrophysiology Rounding Note  Patient Name: Gregory [name] Date of Encounter: 01/12/2023  Primary Cardiologist: Sanda Klein, MD Electrophysiologist: Virl Axe, MD   Subjective   Sleeping, stirs easily. Not sure what time his family is coming today. Denies SOB at rest.   Inpatient Medications    Scheduled Meds:  apixaban  5 mg Oral BID   calcium carbonate  1 tablet Oral Daily   Chlorhexidine Gluconate Cloth  6 each Topical Daily   clopidogrel  75 mg Oral Daily   ezetimibe  10 mg Oral Daily   feeding supplement  237 mL Oral TID BM   Gerhardt's butt cream   Topical BID   levothyroxine  50 mcg Oral Q0600   multivitamin with minerals  1 tablet Oral Daily   traZODone  50 mg Oral QHS   vancomycin variable dose per unstable renal function (pharmacist dosing)   Does not apply See admin instructions   Continuous Infusions:  sodium chloride 250 mL (01/12/23 0103)   sodium chloride 100 mL/hr at 01/12/23 0144   amiodarone 60 mg/hr (01/12/23 0318)   ceFEPime (MAXIPIME) IV 2 g (01/11/23 1932)   levETIRAcetam 1,000 mg (01/11/23 2113)   norepinephrine (LEVOPHED) Adult infusion 10 mcg/min (01/12/23 0108)   PRN Meds: acetaminophen, diclofenac Sodium, loperamide, nitroGLYCERIN, polyethylene glycol   Vital Signs    Vitals:   01/12/23 0400 01/12/23 0500 01/12/23 0600 01/12/23 0700  BP: (!) 82/70 (!) 102/57 99/62 (!) 88/51  Pulse: 74 75 69 70  Resp: '17 16 17 18  '$ Temp:      TempSrc:      SpO2: 97% 97% 95% 93%  Weight:      Height:        Intake/Output Summary (Last 24 hours) at 01/12/2023 0717 Last data filed at 01/12/2023 0646 Gross per 24 hour  Intake 2972.21 ml  Output 975 ml  Net 1997.21 ml   Filed Weights   01/10/23 0500 01/11/23 0500 01/12/23 0322  Weight: 89.4 kg 88.1 kg 95.1 kg    Physical Exam    GEN- The patient is chronically ill appearing, alert and oriented x 3 today.   HEENT- No gross abnormality.  Lungs- Clear to ausculation  bilaterally, normal work of breathing Heart- Regular rate and rhythm, no murmurs, rubs or gallops GI- soft, NT, ND, + BS Extremities- no clubbing or cyanosis. No edema Neuro- No obvious focal abnormality.   Telemetry    NSR in 70s with occasional NSVT,more this am. (personally reviewed)   Patient Profile     62 y.o. male with CAD (known chronic occl of RCA with prior PCIs to LAD, diag, and LCx), VT, ICM,  OSA w/CPAP, seizures, chronic CHF, HLD, HTN, obesity, AFib, h/o VT, PVCs admitted with VT storm   Device information Medtronic Dual Chamber ICD implanted 2005, 2010 and gen change for 2018   2017 to have slow VT prompting reduction of his VT zone rate to 150bpm.   Oct 2021, VT with appropriate therapies   He had stopped drinking (heavily at least).  He noted previously had been on amiodarone and mexiletine , the mexiletine stopped 2/2 GI symptoms, and the amiodarone stopped 2/2 abn ALk phos   Oct 2021 > amio restarted for recurrent VT     Device interrogation ([facility])\presenting SR, PVCs Battery alnd lead numbers are good VP 0.2% OptiVol looks good 2 treated VT episode Total of 12 spins of ATP 1st episode ATP scheme was successful 2ns episode was not after all 9  ATPs delivered No shocks programmed in this zone (136-182,   Assessment & Plan    VT storm In NSR on IV amiodarone but having frequent runs of slow NSVT Some ? If could be related to Xeloda, but EKG otherwise without ischemic changes. Potassium2.8* (01/29 0006) Magnesium  1.9 (01/29 0006) Creatinine, ser  2.46* (01/29 0006) Keep K > 4.0 and Mg > 2.0   2. CAD  No chest pain. No indication for ischemic eval.   3. Paroxysmal AF Continue eliquis for CHA2DS2VASc  of at least 4  4. Vasoplegic/septic shock Declines central line Remains on Norepi.   5. Heart failure with reduced ejection fraction 2/2 ischemic CMP Appreciate HF team following.   6. Hypokalemia K 3.9 -> 2.8 at 0006 overnight and supped  with IV Recheck this am pending.   7. AKI on CKD Cr 1.25 -> 1.75 -> 2.11 -> 2.46  8. Goals of Care Palliative has been consulted, has been made DNR/DNI Does not want central line    For questions or updates, please contact [place] Please consult [email or web address] for contact info under Cardiology/STEMI.  Signed, Shirley Friar, PA-C  01/12/2023, 7:17 AM   I have seen and examined this patient with Oda Kilts.  Agree with above, note added to reflect my findings.  Remains hypotensive despite both IV fluids and levo.  Continues to be somnolent.  Has had increasing ventricular arrhythmias over the last few hours.  GEN: Well nourished, well developed, in no acute distress  HEENT: normal  Neck: no JVD, carotid bruits, or masses Cardiac: RRR; no murmurs, rubs, or gallops,no edema  Respiratory:  clear to auscultation bilaterally, normal work of breathing GI: soft, nontender, nondistended, + BS MS: no deformity or atrophy  Skin: warm and dry, device site well healed Neuro:  Strength and sensation are intact Psych: euthymic mood, full affect   Ventricular tachycardia storm: Currently on IV amiodarone.  Unfortunately he is continuing to have episodes of VT.  Due to his limited IV access, no further medications IV can be added.  Remiel Corti start him on oral mexiletine if this can be given based on his ability to swallow. Ongoing shock: Family does not want central access.  Mixed picture of distributive and cardiogenic.  Plan for family discussion later today. Acute on chronic renal failure: Creatinine continues to increase.  Plan per primary team. Chronic systolic heart failure: Getting IV fluids as supportive care for shock currently.  Concern over overall picture with stage IV colon cancer, VT storm, ongoing shock, acute renal failure.  Agree with another goals of care discussion with the patient's family as he has been worsening throughout his hospital visit.  Hosanna Betley M. Caliah Kopke  MD 01/12/2023 8:51 AM

## 2023-01-12 NOTE — Plan of Care (Signed)
  Problem: Clinical Measurements: Goal: Respiratory complications will improve Outcome: Progressing   Problem: Coping: Goal: Level of anxiety will decrease Outcome: Progressing   Problem: Pain Managment: Goal: General experience of comfort will improve Outcome: Progressing   Problem: Safety: Goal: Ability to remain free from injury will improve Outcome: Progressing   Problem: Skin Integrity: Goal: Risk for impaired skin integrity will decrease Outcome: Progressing   Problem: Nutrition: Goal: Adequate nutrition will be maintained Outcome: Not Progressing   Problem: Elimination: Goal: Will not experience complications related to bowel motility Outcome: Not Progressing Goal: Will not experience complications related to urinary retention Outcome: Not Progressing

## 2023-01-12 NOTE — Progress Notes (Signed)
Nutrition Brief Note  Chart reviewed. Pt now transitioning to comfort care.  No further nutrition interventions planned at this time.  Please re-consult as needed.   Kerman Passey MS, RDN, LDN, CNSC Registered Dietitian 3 Clinical Nutrition RD Pager and On-Call Pager Number Located in [place]

## 2023-01-12 NOTE — Progress Notes (Addendum)
1250 - Daughter at bedside - Attempted to page palliative NP. Dr. Tamala Julian aware daughter is at bedside, aware of vitals and rhythm as well as increased oxygen demand. Dr. Tamala Julian at bedside.   1311 - attempted to page palliative   1408 - [employer] rep at bedside to turn ICD off   1450 - Daughter states pt is uncomfortable, requesting pain medication for patient.   64 - Daughter states that pt has been waking up and panicking, requesting ativan for patient.

## 2023-01-12 NOTE — Progress Notes (Addendum)
Advanced Heart Failure Rounding Note  PCP-Cardiologist: Sanda Klein, MD   Patient Profile  Gregory [name] is a 62 y.o. male with advanced colon cancer and is currently on palliative chemotherapy (Xeloda), HFrEF, CAD (CTO of the RCA, PCI to the LAD,diag,Lcx) atrial fibrillation on apixaban, OSA, CPAP admitted to the EP service for VT storm on 1/24. Developed shock 1/27(hypotension, rise in serum creatinine and elevated LFTs with worsening encephalopathy>>AHF team consulted)  Subjective:    Shock felt to be primarily vasoplegic/septic.   On NE 10. + Vanc and cefepime.   Echo 1/27 EF 35-40% (same as prior), RV mildly reduced.  Amio gtt 60/hr. C/w frequent runs of VT.  K 2.8  Mg 1.9   No central line access.   He is DNR. Family meeting planned today for [place] discussion.   Objective:   Weight Range: 95.1 kg Body mass index is 24.23 kg/m.   Vital Signs:   Temp:  [97.4 F (36.3 C)-98.3 F (36.8 C)] 97.8 F (36.6 C) (01/29 0700) Pulse Rate:  [69-78] 70 (01/29 0700) Resp:  [15-20] 18 (01/29 0700) BP: (71-102)/(51-70) 88/51 (01/29 0700) SpO2:  [91 %-98 %] 93 % (01/29 0700) Weight:  [95.1 kg] 95.1 kg (01/29 0322) Last BM Date :  (continuous via flexiseal)  Weight change: Filed Weights   01/10/23 0500 01/11/23 0500 01/12/23 0322  Weight: 89.4 kg 88.1 kg 95.1 kg    Intake/Output:   Intake/Output Summary (Last 24 hours) at 01/12/2023 0859 Last data filed at 01/12/2023 0646 Gross per 24 hour  Intake 2086.3 ml  Output 975 ml  Net 1111.3 ml      Physical Exam    General: fatigued appearing. No resp difficulty HEENT: Normal Neck: Supple. JVP not elevated . Carotids 2+ bilat; no bruits. No lymphadenopathy or thyromegaly appreciated. Cor: PMI nondisplaced. Regular rhythm, tachy rate. No rubs, gallops or murmurs. Lungs: Clear Abdomen: Soft, nontender, nondistended. No hepatosplenomegaly. No bruits or masses. Good bowel sounds. Extremities: No cyanosis,  clubbing, rash, edema Neuro: Alert & orientedx3, cranial nerves grossly intact. moves all 4 extremities w/o difficulty. Affect pleasant   Telemetry   NSR w/ frequent NSVT   EKG    N/A   Labs    CBC Recent Labs    01/10/23 1141  WBC 12.5*  NEUTROABS 9.0*  HGB 16.3  HCT 47.5  MCV 101.5*  PLT 811   Basic Metabolic Panel Recent Labs    01/10/23 1141 01/12/23 0006  NA 134* 130*  K 3.9 2.8*  CL 106 105  CO2 15* 12*  GLUCOSE 145* 258*  BUN 53* 56*  CREATININE 2.11* 2.46*  CALCIUM 7.8* 7.1*  MG  --  1.9   Liver Function Tests Recent Labs    01/10/23 1141  AST 214*  ALT 355*  ALKPHOS 81  BILITOT 0.8  PROT 4.7*  ALBUMIN 2.5*   No results for input(s): "LIPASE", "AMYLASE" in the last 72 hours. Cardiac Enzymes No results for input(s): "CKTOTAL", "CKMB", "CKMBINDEX", "TROPONINI" in the last 72 hours.  BNP: BNP (last 3 results) Recent Labs    08/30/22 1447 09/22/22 2229 12/29/2022 0956  BNP 607.0* 300.3* 150.5*    ProBNP (last 3 results) No results for input(s): "PROBNP" in the last 8760 hours.   D-Dimer No results for input(s): "DDIMER" in the last 72 hours. Hemoglobin A1C No results for input(s): "HGBA1C" in the last 72 hours. Fasting Lipid Panel No results for input(s): "CHOL", "HDL", "LDLCALC", "TRIG", "CHOLHDL", "LDLDIRECT" in the last  72 hours. Thyroid Function Tests Recent Labs    01/10/23 1642  TSH 36.751*    Other results:   Imaging    No results found.   Medications:     Scheduled Medications:  apixaban  5 mg Oral BID   calcium carbonate  1 tablet Oral Daily   Chlorhexidine Gluconate Cloth  6 each Topical Daily   clopidogrel  75 mg Oral Daily   ezetimibe  10 mg Oral Daily   feeding supplement  237 mL Oral TID BM   Gerhardt's butt cream   Topical BID   levothyroxine  50 mcg Oral Q0600   multivitamin with minerals  1 tablet Oral Daily   traZODone  50 mg Oral QHS   vancomycin variable dose per unstable renal function  (pharmacist dosing)   Does not apply See admin instructions    Infusions:  sodium chloride 250 mL (01/12/23 0103)   sodium chloride 100 mL/hr at 01/12/23 0144   amiodarone 60 mg/hr (01/12/23 0318)   ceFEPime (MAXIPIME) IV 2 g (01/12/23 0855)   levETIRAcetam 1,000 mg (01/11/23 2113)   norepinephrine (LEVOPHED) Adult infusion 10 mcg/min (01/12/23 0730)    PRN Medications: acetaminophen, diclofenac Sodium, fentaNYL (SUBLIMAZE) injection, loperamide, nitroGLYCERIN, polyethylene glycol    Patient Profile   Gregory [name] is a 62 y.o. male with advanced colon cancer and is currently on palliative chemotherapy (Xeloda), HFrEF, CAD (CTO of the RCA, PCI to the LAD,diag,Lcx) atrial fibrillation on apixaban, OSA, CPAP admitted to the EP service for VT storm on 1/24. Developed shock 1/27(hypotension, rise in serum creatinine and elevated LFTs with worsening encephalopathy>>AHF team consulted)   Assessment/Plan    Vasoplegic/septic shock -On exam 01/10/23, patient hypotensive to 70s/50s; echocardiogram with IVC less than 1 cm and completely collapsible with inspiration.  LVEF stable at 35 to 40%. Hemodynamics and clinical exam not consistent with cardiogenic shock. - lengthy discussion with Gregory [name] daughter.  She is very realistic with his overall health and long-term prognosis.  He is now DNR/DNI.  The only advanced measures they would want at this time are cardioversion for ventricular tachycardia. Does not want CVC.  - Today, pt remains hypotensive on levophed 10 mcg - continue vancomycin + cefepime per CCM  - received fluid resuscitation    2.  Heart failure with reduced ejection fraction -Secondary to ischemic cardiomyopathy.  Current shock does not appear to be secondary to Xeloda induced cardiomyopathy. - No diuretics/ GDMT w/ shock and AKI    3.  Coronary artery disease -Stable by left heart catheter 5/23.     4.  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 -Underwent resection on 04/18/2022 with pathology showing 6.4 cm moderate to poorly differentiated adenocarcinoma with positive lymph nodes and vascular invasion.  Has been on Xeloda since 07/21/2022 with PET scan in August 2023 demonstrating numerous hepatic and upper abdominal lymph nodes. -Until roughly 1 to 2 weeks ago patient was performing ADLs independently however after completing his most recent cycle of Xeloda roughly 6 days ago his daughter reports significant weakness, nausea, failure to thrive with minimal p.o. intake. -Although ventricular tachycardia could be secondary to Xeloda, his EKG is otherwise normal without ischemic changes.  I am doubtful that this is chemotherapy induced cardiomyopathy at this time.  Will continue supportive care over the next 24 to 48 hours with palliative care consult.  Family would like to discuss goals of care.  5. VT Storm -  c/w runs of NSVT - continue amio gtt at 60/hr - aggressive K supp (2.8), Mg 1.9  - EP following   Family meeting planned again later today.    Length of Stay: [address], PA-C  01/12/2023, 8:59 AM  Advanced Heart Failure Team Pager [phone] (M-F; 7a - 5p)  Please contact [facility] for night-coverage after hours (5p -7a ) and weekends on [email or web address]  Patient seen with PA, agree with the above note.   He will open his eyes but not able to participate in conversation.  Daughter at bedside.    He remains on peripheral NE at 10.  He is on amiodarone gtt at 60 mg/hr but still with frequent NSVT runs.  Creatinine trending up, 2.46 currently.    General: NAD Neck: JVP 8 cm, no thyromegaly or thyroid nodule.  Lungs: Clear to auscultation bilaterally with normal respiratory effort. CV: Nondisplaced PMI.  Heart regular S1/S2, no S3/S4, no murmur.  No peripheral edema.    Abdomen: Soft, nontender, no hepatosplenomegaly, no distention.  Skin: Intact without lesions or rashes.   Neurologic: Opens eyes but will not participate in conversation.  Extremities: No clubbing or cyanosis.  HEENT: Normal.   He is in mixed septic/cardiogenic shock, remains on NE 10 peripherally with worsening renal function likely cardiorenal syndrome.  He is on vancomycin/cefepime to cover infectious component.   Ongoing episodes of NSVT despite high dose amiodarone gtt, unable to consistently take po for mexiletine.   I think prognosis looks poor at this point with current clinical situation + his advanced cancer.  Family has requested no central line and limited blood draw.  I discussed consideration of hospice care with this daughter, it sounds like family is moving in this direction.  Continue [place] discussions.   CRITICAL CARE Performed by: Loralie Champagne  Total critical care time: 35 minutes  Critical care time was exclusive of separately billable procedures and treating other patients.  Critical care was necessary to treat or prevent imminent or life-threatening deterioration.  Critical care was time spent personally by me on the following activities: development of treatment plan with patient and/or surrogate as well as nursing, discussions with consultants, evaluation of patient's response to treatment, examination of patient, obtaining history from patient or surrogate, ordering and performing treatments and interventions, ordering and review of laboratory studies, ordering and review of radiographic studies, pulse oximetry and re-evaluation of patient's condition.  Loralie Champagne 01/12/2023 9:32 AM

## 2023-01-12 NOTE — Progress Notes (Signed)
Palliative:  HPI: 62 y.o. male  with past medical history of coronary artery disease, A-fib, heart failure, ischemic cardiomyopathy, ICD in place, PEA cardiac arrest in September 2023-due to aspiration following seizure, stage IV colon cancer with mets to pancreas and liver, -has been on Xeloda admitted on 01/09/2023 with VT storm.  He was started on amiodarone and was in sinus rhythm.  Today became lethargic, hypotensive.  Concerns for cardiogenic or septic shock.  Palliative medicine consulted for cardiogenic shock.   I met today initially at Gregory [name] bedside. I discussed with RN who explains worsening status over the past couple hours. Increased VT, desaturations, maxed on vasopressors. He appears restless and overall uncomfortable. I placed call to daughter, Gregory [name], and expressed concerns for worsening status and that I worry he is transitioning to end of life. I expressed my concern that his time is very limited despite interventions provided. She is en-route.  Update: I met with Gregory [name] at bedside. We reviewed plans and goals for Gregory [name]. Gregory [name] agrees with transition to full comfort care at this time recognizing his poor prognosis. She shares more about her father's health journey and how he has seemed critically ill during multiple hospitalizations. He had a cardiac arrest last September that they did not believe he would survive. Gregory [name] admits that she felt this time was different and has felt he would not recover this time around. Gregory [name] has already called all her family and tried to prepare herself and family for what is to come. Gregory [name] cares for her father, grandmother, and son. I encouraged her to remember to take care of herself as well.   All questions/concerns addressed. Emotional support provided.   Exam: Fluctuating alertness. Difficult to understand his speech most of the time. Restless. Breathing regular, unlabored but with desaturations. HR with frequent VT. Abd soft.   Plan: -  DNR (AICD deactivated today) - Full comfort care - Anticipate hospital death  108 min  Gregory Sill, NP Palliative Medicine Team Pager [phone] (Please see [email or web address] for schedule) Team Phone [phone]    Greater than 50%  of this time was spent counseling and coordinating care related to the above assessment and plan

## 2023-01-13 ENCOUNTER — Other Ambulatory Visit (HOSPITAL_COMMUNITY): Payer: Self-pay

## 2023-01-13 DIAGNOSIS — C182 Malignant neoplasm of ascending colon: Secondary | ICD-10-CM | POA: Diagnosis not present

## 2023-01-15 LAB — CULTURE, BLOOD (ROUTINE X 2)
Culture: NO GROWTH
Culture: NO GROWTH
Special Requests: ADEQUATE
Special Requests: ADEQUATE

## 2023-01-15 NOTE — Progress Notes (Signed)
Patient ID: Gregory [name], male   DOB: 1960/12/31, 62 y.o.   MRN: [id number]  Transition to comfort care noted, cardiology will sign off.   Loralie Champagne 17-Jan-2023

## 2023-01-15 DEATH — deceased

## 2023-01-26 ENCOUNTER — Ambulatory Visit: Payer: PRIVATE HEALTH INSURANCE | Admitting: Hematology

## 2023-01-26 ENCOUNTER — Other Ambulatory Visit: Payer: PRIVATE HEALTH INSURANCE

## 2023-02-13 NOTE — Death Summary Note (Signed)
DEATH SUMMARY   Patient Details  Name: Gregory [name] MRN: [id number] DOB: 04/10/1961  Admission/Discharge Information   Admit Date:  Feb 06, 2023  Date of Death: Date of Death: 2023/02/12  Time of Death: Time of Death: 0613  Length of Stay: 2023-03-20  Referring Physician: Patient, No Pcp Per   Reason(s) for Hospitalization  VT storm Acute renal failure Stage IV colon cancer CAD, HFrEF, A-fib on Eliquis, OSA on CPAP, seizure disorder   Brief Hospital Course (including significant findings, care, treatment, and services provided and events leading to death)  Gregory [name] is a 62 year old male living with colon cancer, CAD, HFrEF, A-fib on Eliquis, OSA on CPAP, seizure disorder, who was admitted to cardiology service for VT storm and started on amiodarone infusion. His heart rate was well-controlled on IV amnio. On day 3 of admission, patient was found to be hypotensive and more somnolent. Blood pressure failed to improve after 5 cc of IV fluid so Levophed started.   Patient with mixed septic and cardiogenic shock refractory to broad spectrum antibiotics.   Developed worsening renal failure and respiratory distress.  After family meetings decision made to transition to comfort measures and he passed peacefully with family at bedside.  Pertinent Labs and Studies  Significant Diagnostic Studies DG Chest Port 1 View  Result Date: 01/12/2023 CLINICAL DATA:  Pulmonary edema EXAM: PORTABLE CHEST 1 VIEW COMPARISON:  01/10/2023 FINDINGS: Cardiomegaly. Pacemaker/AICD remains in place. There is worsened pulmonary venous hypertension, possibly with early interstitial edema. Atelectasis in the left lower lobe persists. IMPRESSION: Worsened pulmonary venous hypertension, possibly with early interstitial edema. Persistent atelectasis in the left lower lobe. Electronically Signed   By: Nelson Chimes M.D.   On: 01/12/2023 09:10   US RENAL  Result Date: 01/10/2023 CLINICAL DATA:  Acute renal  insufficiency EXAM: RENAL / URINARY TRACT ULTRASOUND COMPLETE COMPARISON:  None Available. FINDINGS: Right Kidney: Renal measurements: 12.2 x 5.6 x 6.0 cm = volume: 209 mL. Demonstrates a 2.3 cm upper pole cyst. No follow-up imaging recommended for the cyst. Left Kidney: Renal measurements: 11 x 7 x 6.7 cm = volume: 268 mL. Echogenicity within normal limits. No mass or hydronephrosis visualized. Bladder: Appears normal for degree of bladder distention. Other: None. IMPRESSION: No significant abnormalities. No cause for acute renal insufficiency identified. Electronically Signed   By: Dorise Bullion III M.D.   On: 01/10/2023 13:02   DG Chest Port 1 View  Result Date: 01/10/2023 CLINICAL DATA:  Sepsis EXAM: PORTABLE CHEST 1 VIEW COMPARISON:  One-view chest x-ray Feb 06, 2023 FINDINGS: Heart is enlarged.  Pacing and defibrillator wires are stable. Linear density at the left base is slightly improved, likely reflecting atelectasis. Lung volumes remain low. No other focal airspace disease is present. IMPRESSION: 1. Cardiomegaly without failure. 2. Slight improvement in left basilar atelectasis. Electronically Signed   By: San Morelle M.D.   On: 01/10/2023 11:38   ECHOCARDIOGRAM COMPLETE  Result Date: 01/10/2023    ECHOCARDIOGRAM REPORT   Patient Name:   Gregory [name] Date of Exam: 01/10/2023 Medical Rec #:  [id number]                 Height:       78.0 in Accession #:    [id number]                Weight:       197.1 lb Date of Birth:  November 03, 1961  BSA:          2.243 m Patient Age:    60 years                  BP:           77/58 mmHg Patient Gender: M                         HR:           68 bpm. Exam Location:  Inpatient Procedure: 2D Echo STAT ECHO Indications:    dyspnea. V-Tach.  History:        Patient has prior history of Echocardiogram examinations, most                 recent 09/05/2022. Cardiomyopathy, Defibrillator; Risk                 Factors:Hypertension, Dyslipidemia,  Diabetes, Sleep Apnea and                 Former Smoker.  Sonographer:    Johny Chess RDCS Referring Phys: [phone] HAO MENG  Sonographer Comments: Technically difficult study due to poor echo windows. Image acquisition challenging due to respiratory motion. IMPRESSIONS  1. Left ventricular ejection fraction, by estimation, is 35 to 40%. The left ventricle has moderately decreased function. The left ventricle demonstrates regional wall motion abnormalities (see scoring diagram/findings for description). The left ventricular internal cavity size was mildly to moderately dilated. Left ventricular diastolic parameters are consistent with Grade I diastolic dysfunction (impaired relaxation).  2. Right ventricular systolic function is mildly reduced. The right ventricular size is normal.  3. Left atrial size was mildly dilated.  4. A small pericardial effusion is present. The pericardial effusion is circumferential. There is no evidence of cardiac tamponade.  5. The mitral valve is normal in structure. No evidence of mitral valve regurgitation.  6. The aortic valve is tricuspid. Aortic valve regurgitation is not visualized. Aortic valve sclerosis/calcification is present, without any evidence of aortic stenosis.  7. The inferior vena cava is normal in size with greater than 50% respiratory variability, suggesting right atrial pressure of 3 mmHg. FINDINGS  Left Ventricle: Left ventricular ejection fraction, by estimation, is 35 to 40%. The left ventricle has moderately decreased function. The left ventricle demonstrates regional wall motion abnormalities. The left ventricular internal cavity size was mildly to moderately dilated. There is no left ventricular hypertrophy. Left ventricular diastolic parameters are consistent with Grade I diastolic dysfunction (impaired relaxation). Normal left ventricular filling pressure.  LV Wall Scoring: The antero-lateral wall, inferior wall, and posterior wall are akinetic. Right  Ventricle: The right ventricular size is normal. No increase in right ventricular wall thickness. Right ventricular systolic function is mildly reduced. Left Atrium: Left atrial size was mildly dilated. Right Atrium: Right atrial size was normal in size. Pericardium: A small pericardial effusion is present. The pericardial effusion is circumferential. There is no evidence of cardiac tamponade. Mitral Valve: The mitral valve is normal in structure. No evidence of mitral valve regurgitation. Tricuspid Valve: The tricuspid valve is grossly normal. Tricuspid valve regurgitation is not demonstrated. Aortic Valve: The aortic valve is tricuspid. Aortic valve regurgitation is not visualized. Aortic valve sclerosis/calcification is present, without any evidence of aortic stenosis. Pulmonic Valve: The pulmonic valve was not well visualized. Pulmonic valve regurgitation is not visualized. Aorta: The aortic root was not well visualized. Venous: The inferior vena cava is normal in size with greater  than 50% respiratory variability, suggesting right atrial pressure of 3 mmHg. IAS/Shunts: No atrial level shunt detected by color flow Doppler. Additional Comments: A device lead is visualized in the right ventricle.  LEFT VENTRICLE PLAX 2D LVIDd:         6.10 cm      Diastology LVIDs:         5.10 cm      LV e' medial:    5.87 cm/s LV PW:         1.10 cm      LV E/e' medial:  6.1 LV IVS:        1.10 cm      LV e' lateral:   5.00 cm/s LVOT diam:     2.00 cm      LV E/e' lateral: 7.2 LV SV:         27 LV SV Index:   12 LVOT Area:     3.14 cm  LV Volumes (MOD) LV vol d, MOD A2C: 103.0 ml LV vol s, MOD A2C: 65.7 ml LV SV MOD A2C:     37.3 ml RIGHT VENTRICLE            IVC RV S prime:     8.38 cm/s  IVC diam: 0.80 cm TAPSE (M-mode): 1.8 cm LEFT ATRIUM           Index        RIGHT ATRIUM           Index LA diam:      3.70 cm 1.65 cm/m   RA Area:     11.00 cm LA Vol (A4C): 75.1 ml 33.47 ml/m  RA Volume:   19.70 ml  8.78 ml/m  AORTIC  VALVE LVOT Vmax:   49.00 cm/s LVOT Vmean:  31.700 cm/s LVOT VTI:    0.086 m MITRAL VALVE MV Area (PHT): 1.84 cm    SHUNTS MV Decel Time: 412 msec    Systemic VTI:  0.09 m MV E velocity: 35.90 cm/s  Systemic Diam: 2.00 cm MV A velocity: 37.20 cm/s MV E/A ratio:  0.97 [name] Croitoru MD Electronically signed by Sanda Klein MD Signature Date/Time: 01/10/2023/11:13:18 AM    Final    DG Chest Port 1 View  Result Date: 12/20/2022 CLINICAL DATA:  Pacer fired EXAM: PORTABLE CHEST 1 VIEW COMPARISON:  09/23/2022 FINDINGS: Left base subsegmental atelectasis or consolidation and small left-sided effusion are stable findings. Aorta is tortuous and calcified. No pneumothorax. Normal pulmonary vasculature. Left-sided pacemaker. IMPRESSION: Left base subsegmental atelectasis or scarring and minimal left-sided pleural effusion. Otherwise no acute cardiopulmonary process. Electronically Signed   By: Sammie Bench M.D.   On: 12/16/2022 10:10    Microbiology Recent Results (from the past 240 hour(s))  C Difficile Quick Screen w PCR reflex     Status: None   Collection Time: 01/08/23  9:16 PM   Specimen: STOOL  Result Value Ref Range Status   C Diff antigen NEGATIVE NEGATIVE Final   C Diff toxin NEGATIVE NEGATIVE Final   C Diff interpretation No C. difficile detected.  Final    Comment: Performed at [facility], [name] [address] [place],  [address]  Culture, blood (x 2)     Status: None   Collection Time: 01/10/23 11:41 AM   Specimen: BLOOD  Result Value Ref Range Status   Specimen Description BLOOD FOOT  Final   Special Requests IN PEDIATRIC BOTTLE Blood Culture adequate volume  Final   Culture  Final    NO GROWTH 5 DAYS Performed at [facility], [name] [address], [place], [place] [address]    Report Status 01/15/2023 FINAL  Final  Culture, blood (x 2)     Status: None   Collection Time: 01/10/23  4:42 PM   Specimen: BLOOD LEFT HAND  Result Value Ref Range Status   Specimen  Description BLOOD LEFT HAND  Final   Special Requests   Final    BOTTLES DRAWN AEROBIC ONLY Blood Culture adequate volume   Culture   Final    NO GROWTH 5 DAYS Performed at [facility], [name] [address] [place], [place] [address]    Report Status 01/15/2023 FINAL  Final    Lab Basic Metabolic Panel: Recent Labs  Lab 01/12/23 0006 01/12/23 1001  NA 130* 133*  K 2.8* 3.3*  CL 105 108  CO2 12* 10*  GLUCOSE 258* QUANTITY NOT SUFFICIENT, UNABLE TO PERFORM TEST  BUN 56* 57*  CREATININE 2.46* QUANTITY NOT SUFFICIENT, UNABLE TO PERFORM TEST  CALCIUM 7.1* 7.3*  MG 1.9  --    Liver Function Tests: Recent Labs  Lab 01/12/23 1001  AST 122*  ALT 217*  ALKPHOS 95  BILITOT 0.7  PROT QUANTITY NOT SUFFICIENT, UNABLE TO PERFORM TEST  ALBUMIN QUANTITY NOT SUFFICIENT, UNABLE TO PERFORM TEST   No results for input(s): "LIPASE", "AMYLASE" in the last 168 hours. No results for input(s): "AMMONIA" in the last 168 hours. CBC: Recent Labs  Lab 01/12/23 1001  WBC 10.4  HGB 16.1  HCT 46.0  MCV 99.6  PLT 174   Cardiac Enzymes: No results for input(s): "CKTOTAL", "CKMB", "CKMBINDEX", "TROPONINI" in the last 168 hours. Sepsis Labs: Recent Labs  Lab 01/12/23 1001  WBC 10.4      Candee Furbish 01/17/2023, 6:02 PM

## 2023-02-23 ENCOUNTER — Encounter: Payer: PRIVATE HEALTH INSURANCE | Admitting: Internal Medicine

## 2023-06-05 ENCOUNTER — Ambulatory Visit: Payer: PRIVATE HEALTH INSURANCE | Admitting: Neurology
